# Patient Record
Sex: Female | Born: 1955
Health system: Southern US, Community
[De-identification: ages and names within clinical notes are randomized; demographics above are authoritative.]

## PROBLEM LIST (undated history)

## (undated) DIAGNOSIS — O24419 Gestational diabetes mellitus in pregnancy, unspecified control: Secondary | ICD-10-CM

## (undated) DIAGNOSIS — E559 Vitamin D deficiency, unspecified: Secondary | ICD-10-CM

## (undated) DIAGNOSIS — B20 Human immunodeficiency virus [HIV] disease: Secondary | ICD-10-CM

## (undated) DIAGNOSIS — M6530 Trigger finger, unspecified finger: Secondary | ICD-10-CM

## (undated) DIAGNOSIS — G510 Bell's palsy: Secondary | ICD-10-CM

## (undated) DIAGNOSIS — C801 Malignant (primary) neoplasm, unspecified: Secondary | ICD-10-CM

## (undated) DIAGNOSIS — Z87442 Personal history of urinary calculi: Secondary | ICD-10-CM

## (undated) DIAGNOSIS — S72009A Fracture of unspecified part of neck of unspecified femur, initial encounter for closed fracture: Secondary | ICD-10-CM

## (undated) DIAGNOSIS — N950 Postmenopausal bleeding: Secondary | ICD-10-CM

## (undated) DIAGNOSIS — Z801 Family history of malignant neoplasm of trachea, bronchus and lung: Secondary | ICD-10-CM

## (undated) DIAGNOSIS — L639 Alopecia areata, unspecified: Secondary | ICD-10-CM

## (undated) DIAGNOSIS — N183 Chronic kidney disease, stage 3 (moderate): Secondary | ICD-10-CM

## (undated) DIAGNOSIS — Z807 Family history of other malignant neoplasms of lymphoid, hematopoietic and related tissues: Secondary | ICD-10-CM

## (undated) DIAGNOSIS — B350 Tinea barbae and tinea capitis: Secondary | ICD-10-CM

## (undated) DIAGNOSIS — M1993 Secondary osteoarthritis, unspecified site: Secondary | ICD-10-CM

## (undated) DIAGNOSIS — E890 Postprocedural hypothyroidism: Secondary | ICD-10-CM

## (undated) DIAGNOSIS — R7301 Impaired fasting glucose: Secondary | ICD-10-CM

## (undated) DIAGNOSIS — M1712 Unilateral primary osteoarthritis, left knee: Secondary | ICD-10-CM

## (undated) DIAGNOSIS — I739 Peripheral vascular disease, unspecified: Secondary | ICD-10-CM

## (undated) DIAGNOSIS — E782 Mixed hyperlipidemia: Secondary | ICD-10-CM

## (undated) DIAGNOSIS — H04129 Dry eye syndrome of unspecified lacrimal gland: Secondary | ICD-10-CM

## (undated) DIAGNOSIS — I10 Essential (primary) hypertension: Secondary | ICD-10-CM

## (undated) DIAGNOSIS — Z9221 Personal history of antineoplastic chemotherapy: Secondary | ICD-10-CM

## (undated) HISTORY — DX: Trigger finger, unspecified finger: M65.30

## (undated) HISTORY — DX: Impaired fasting glucose: R73.01

## (undated) HISTORY — DX: Family history of malignant neoplasm of trachea, bronchus and lung: Z80.1

## (undated) HISTORY — PX: MASTECTOMY: SHX3

## (undated) HISTORY — DX: Essential (primary) hypertension: I10

## (undated) HISTORY — PX: COLONOSCOPY WITH ESOPHAGOGASTRODUODENOSCOPY (EGD): SHX5779

## (undated) HISTORY — DX: Peripheral vascular disease, unspecified: I73.9

## (undated) HISTORY — DX: Mixed hyperlipidemia: E78.2

## (undated) HISTORY — DX: Alopecia areata, unspecified: L63.9

## (undated) HISTORY — DX: Unilateral primary osteoarthritis, left knee: M17.12

## (undated) HISTORY — PX: TUBAL LIGATION: SHX77

## (undated) HISTORY — DX: Bell's palsy: G51.0

## (undated) HISTORY — DX: Chronic kidney disease, stage 3 (moderate): N18.3

## (undated) HISTORY — DX: Postprocedural hypothyroidism: E89.0

## (undated) HISTORY — DX: Fracture of unspecified part of neck of unspecified femur, initial encounter for closed fracture: S72.009A

## (undated) HISTORY — DX: Postmenopausal bleeding: N95.0

## (undated) HISTORY — PX: COLONOSCOPY: SHX174

## (undated) HISTORY — DX: Family history of other malignant neoplasms of lymphoid, hematopoietic and related tissues: Z80.7

## (undated) HISTORY — PX: OTHER SURGICAL HISTORY: SHX169

## (undated) HISTORY — DX: Tinea barbae and tinea capitis: B35.0

## (undated) HISTORY — DX: Secondary osteoarthritis, unspecified site: M19.93

## (undated) HISTORY — DX: Vitamin D deficiency, unspecified: E55.9

## (undated) HISTORY — DX: Dry eye syndrome of unspecified lacrimal gland: H04.129

## (undated) HISTORY — DX: Human immunodeficiency virus (HIV) disease: B20

## (undated) HISTORY — PX: BREAST LUMPECTOMY: SHX2

---
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
## 1992-05-25 ENCOUNTER — Encounter (INDEPENDENT_AMBULATORY_CARE_PROVIDER_SITE_OTHER): Payer: Self-pay | Admitting: *Deleted

## 1997-11-08 ENCOUNTER — Encounter: Admission: RE | Admit: 1997-11-08 | Discharge: 1997-11-08 | Payer: Self-pay | Admitting: Infectious Diseases

## 1997-12-06 ENCOUNTER — Encounter: Admission: RE | Admit: 1997-12-06 | Discharge: 1997-12-06 | Payer: Self-pay | Admitting: Infectious Diseases

## 1998-02-07 ENCOUNTER — Encounter: Admission: RE | Admit: 1998-02-07 | Discharge: 1998-02-07 | Payer: Self-pay | Admitting: Infectious Diseases

## 1998-03-07 ENCOUNTER — Encounter: Admission: RE | Admit: 1998-03-07 | Discharge: 1998-03-07 | Payer: Self-pay | Admitting: Internal Medicine

## 1998-03-29 ENCOUNTER — Encounter: Payer: Self-pay | Admitting: Internal Medicine

## 1998-03-29 ENCOUNTER — Ambulatory Visit (HOSPITAL_COMMUNITY): Admission: RE | Admit: 1998-03-29 | Discharge: 1998-03-29 | Payer: Self-pay | Admitting: Internal Medicine

## 1998-04-27 ENCOUNTER — Encounter: Admission: RE | Admit: 1998-04-27 | Discharge: 1998-04-27 | Payer: Self-pay | Admitting: Infectious Diseases

## 1998-04-27 ENCOUNTER — Ambulatory Visit (HOSPITAL_COMMUNITY): Admission: RE | Admit: 1998-04-27 | Discharge: 1998-04-27 | Payer: Self-pay | Admitting: Infectious Diseases

## 1998-06-02 ENCOUNTER — Encounter: Admission: RE | Admit: 1998-06-02 | Discharge: 1998-06-02 | Payer: Self-pay

## 1998-06-06 ENCOUNTER — Encounter: Admission: RE | Admit: 1998-06-06 | Discharge: 1998-06-06 | Payer: Self-pay | Admitting: Infectious Diseases

## 1998-07-14 ENCOUNTER — Encounter: Admission: RE | Admit: 1998-07-14 | Discharge: 1998-07-14 | Payer: Self-pay | Admitting: Infectious Diseases

## 1998-07-27 ENCOUNTER — Encounter: Admission: RE | Admit: 1998-07-27 | Discharge: 1998-07-27 | Payer: Self-pay | Admitting: Infectious Diseases

## 1998-09-14 ENCOUNTER — Ambulatory Visit (HOSPITAL_COMMUNITY): Admission: RE | Admit: 1998-09-14 | Discharge: 1998-09-14 | Payer: Self-pay | Admitting: Infectious Diseases

## 1998-09-28 ENCOUNTER — Encounter: Admission: RE | Admit: 1998-09-28 | Discharge: 1998-09-28 | Payer: Self-pay | Admitting: Infectious Diseases

## 1998-10-19 ENCOUNTER — Ambulatory Visit (HOSPITAL_COMMUNITY): Admission: RE | Admit: 1998-10-19 | Discharge: 1998-10-19 | Payer: Self-pay | Admitting: Infectious Diseases

## 1998-10-19 ENCOUNTER — Encounter: Payer: Self-pay | Admitting: Infectious Diseases

## 1998-11-09 ENCOUNTER — Ambulatory Visit (HOSPITAL_COMMUNITY): Admission: RE | Admit: 1998-11-09 | Discharge: 1998-11-09 | Payer: Self-pay | Admitting: Infectious Diseases

## 1998-11-23 ENCOUNTER — Encounter: Admission: RE | Admit: 1998-11-23 | Discharge: 1998-11-23 | Payer: Self-pay | Admitting: Infectious Diseases

## 1999-02-08 ENCOUNTER — Ambulatory Visit (HOSPITAL_COMMUNITY): Admission: RE | Admit: 1999-02-08 | Discharge: 1999-02-08 | Payer: Self-pay | Admitting: Infectious Diseases

## 1999-02-08 ENCOUNTER — Encounter: Admission: RE | Admit: 1999-02-08 | Discharge: 1999-02-08 | Payer: Self-pay | Admitting: Infectious Diseases

## 1999-03-08 ENCOUNTER — Encounter: Admission: RE | Admit: 1999-03-08 | Discharge: 1999-03-08 | Payer: Self-pay | Admitting: Infectious Diseases

## 1999-03-29 ENCOUNTER — Encounter: Admission: RE | Admit: 1999-03-29 | Discharge: 1999-03-29 | Payer: Self-pay | Admitting: Infectious Diseases

## 1999-05-31 ENCOUNTER — Encounter: Admission: RE | Admit: 1999-05-31 | Discharge: 1999-05-31 | Payer: Self-pay | Admitting: Internal Medicine

## 1999-05-31 ENCOUNTER — Ambulatory Visit (HOSPITAL_COMMUNITY): Admission: RE | Admit: 1999-05-31 | Discharge: 1999-05-31 | Payer: Self-pay | Admitting: Infectious Diseases

## 1999-06-12 ENCOUNTER — Encounter: Admission: RE | Admit: 1999-06-12 | Discharge: 1999-06-12 | Payer: Self-pay | Admitting: Infectious Diseases

## 1999-08-07 ENCOUNTER — Encounter: Admission: RE | Admit: 1999-08-07 | Discharge: 1999-08-07 | Payer: Self-pay | Admitting: Infectious Diseases

## 1999-11-01 ENCOUNTER — Encounter: Admission: RE | Admit: 1999-11-01 | Discharge: 1999-11-01 | Payer: Self-pay | Admitting: Infectious Diseases

## 1999-11-01 ENCOUNTER — Ambulatory Visit (HOSPITAL_COMMUNITY): Admission: RE | Admit: 1999-11-01 | Discharge: 1999-11-01 | Payer: Self-pay | Admitting: Infectious Diseases

## 1999-11-22 ENCOUNTER — Encounter: Admission: RE | Admit: 1999-11-22 | Discharge: 1999-11-22 | Payer: Self-pay | Admitting: Infectious Diseases

## 2000-01-23 ENCOUNTER — Emergency Department (HOSPITAL_COMMUNITY): Admission: EM | Admit: 2000-01-23 | Discharge: 2000-01-23 | Payer: Self-pay | Admitting: Emergency Medicine

## 2000-01-23 ENCOUNTER — Encounter: Payer: Self-pay | Admitting: Emergency Medicine

## 2000-02-19 ENCOUNTER — Ambulatory Visit (HOSPITAL_COMMUNITY): Admission: RE | Admit: 2000-02-19 | Discharge: 2000-02-19 | Payer: Self-pay | Admitting: Infectious Diseases

## 2000-02-19 ENCOUNTER — Encounter: Admission: RE | Admit: 2000-02-19 | Discharge: 2000-02-19 | Payer: Self-pay | Admitting: Infectious Diseases

## 2000-04-08 ENCOUNTER — Encounter: Payer: Self-pay | Admitting: Emergency Medicine

## 2000-04-08 ENCOUNTER — Emergency Department (HOSPITAL_COMMUNITY): Admission: EM | Admit: 2000-04-08 | Discharge: 2000-04-08 | Payer: Self-pay | Admitting: Emergency Medicine

## 2000-04-15 ENCOUNTER — Encounter: Admission: RE | Admit: 2000-04-15 | Discharge: 2000-04-15 | Payer: Self-pay | Admitting: Infectious Diseases

## 2000-06-25 HISTORY — PX: KNEE ARTHROSCOPY: SUR90

## 2000-07-08 ENCOUNTER — Ambulatory Visit (HOSPITAL_BASED_OUTPATIENT_CLINIC_OR_DEPARTMENT_OTHER): Admission: RE | Admit: 2000-07-08 | Discharge: 2000-07-08 | Payer: Self-pay | Admitting: Orthopedic Surgery

## 2000-07-23 ENCOUNTER — Ambulatory Visit (HOSPITAL_COMMUNITY): Admission: RE | Admit: 2000-07-23 | Discharge: 2000-07-23 | Payer: Self-pay | Admitting: Infectious Diseases

## 2000-07-23 ENCOUNTER — Encounter: Admission: RE | Admit: 2000-07-23 | Discharge: 2000-07-23 | Payer: Self-pay | Admitting: Infectious Diseases

## 2000-08-05 ENCOUNTER — Encounter: Admission: RE | Admit: 2000-08-05 | Discharge: 2000-08-05 | Payer: Self-pay | Admitting: Infectious Diseases

## 2000-10-22 ENCOUNTER — Ambulatory Visit (HOSPITAL_COMMUNITY): Admission: RE | Admit: 2000-10-22 | Discharge: 2000-10-22 | Payer: Self-pay | Admitting: Infectious Diseases

## 2000-10-22 ENCOUNTER — Encounter: Admission: RE | Admit: 2000-10-22 | Discharge: 2000-10-22 | Payer: Self-pay | Admitting: Infectious Diseases

## 2000-11-11 ENCOUNTER — Encounter: Admission: RE | Admit: 2000-11-11 | Discharge: 2000-11-11 | Payer: Self-pay | Admitting: Infectious Diseases

## 2001-03-04 ENCOUNTER — Encounter: Admission: RE | Admit: 2001-03-04 | Discharge: 2001-03-04 | Payer: Self-pay | Admitting: Infectious Diseases

## 2001-03-04 ENCOUNTER — Ambulatory Visit (HOSPITAL_COMMUNITY): Admission: RE | Admit: 2001-03-04 | Discharge: 2001-03-04 | Payer: Self-pay | Admitting: Infectious Diseases

## 2001-03-17 ENCOUNTER — Encounter: Admission: RE | Admit: 2001-03-17 | Discharge: 2001-03-17 | Payer: Self-pay | Admitting: Infectious Diseases

## 2001-09-08 ENCOUNTER — Encounter: Admission: RE | Admit: 2001-09-08 | Discharge: 2001-09-08 | Payer: Self-pay | Admitting: Internal Medicine

## 2001-09-08 ENCOUNTER — Ambulatory Visit (HOSPITAL_COMMUNITY): Admission: RE | Admit: 2001-09-08 | Discharge: 2001-09-08 | Payer: Self-pay | Admitting: Internal Medicine

## 2001-10-06 ENCOUNTER — Encounter: Admission: RE | Admit: 2001-10-06 | Discharge: 2001-10-06 | Payer: Self-pay | Admitting: Infectious Diseases

## 2001-10-16 ENCOUNTER — Encounter: Admission: RE | Admit: 2001-10-16 | Discharge: 2001-10-16 | Payer: Self-pay | Admitting: Obstetrics and Gynecology

## 2001-10-21 ENCOUNTER — Encounter: Admission: RE | Admit: 2001-10-21 | Discharge: 2001-10-21 | Payer: Self-pay | Admitting: Obstetrics and Gynecology

## 2001-10-21 ENCOUNTER — Encounter: Payer: Self-pay | Admitting: Obstetrics and Gynecology

## 2001-11-17 ENCOUNTER — Encounter: Admission: RE | Admit: 2001-11-17 | Discharge: 2001-11-17 | Payer: Self-pay | Admitting: Infectious Diseases

## 2002-02-23 ENCOUNTER — Encounter: Payer: Self-pay | Admitting: Infectious Disease

## 2002-03-02 ENCOUNTER — Encounter: Admission: RE | Admit: 2002-03-02 | Discharge: 2002-03-02 | Payer: Self-pay | Admitting: Infectious Diseases

## 2002-03-02 ENCOUNTER — Encounter: Payer: Self-pay | Admitting: Infectious Diseases

## 2002-03-02 ENCOUNTER — Ambulatory Visit (HOSPITAL_COMMUNITY): Admission: RE | Admit: 2002-03-02 | Discharge: 2002-03-02 | Payer: Self-pay | Admitting: Infectious Diseases

## 2002-04-13 ENCOUNTER — Encounter: Admission: RE | Admit: 2002-04-13 | Discharge: 2002-04-13 | Payer: Self-pay | Admitting: Infectious Diseases

## 2002-06-01 ENCOUNTER — Encounter: Admission: RE | Admit: 2002-06-01 | Discharge: 2002-06-01 | Payer: Self-pay | Admitting: Infectious Diseases

## 2002-06-01 ENCOUNTER — Ambulatory Visit (HOSPITAL_COMMUNITY): Admission: RE | Admit: 2002-06-01 | Discharge: 2002-06-01 | Payer: Self-pay | Admitting: Infectious Diseases

## 2002-08-03 ENCOUNTER — Encounter: Admission: RE | Admit: 2002-08-03 | Discharge: 2002-08-03 | Payer: Self-pay | Admitting: Infectious Diseases

## 2002-09-07 ENCOUNTER — Encounter: Admission: RE | Admit: 2002-09-07 | Discharge: 2002-09-07 | Payer: Self-pay | Admitting: Infectious Diseases

## 2002-09-11 ENCOUNTER — Encounter: Admission: RE | Admit: 2002-09-11 | Discharge: 2002-09-11 | Payer: Self-pay | Admitting: Infectious Diseases

## 2002-11-26 ENCOUNTER — Encounter: Admission: RE | Admit: 2002-11-26 | Discharge: 2002-11-26 | Payer: Self-pay | Admitting: Infectious Diseases

## 2002-11-26 ENCOUNTER — Encounter (INDEPENDENT_AMBULATORY_CARE_PROVIDER_SITE_OTHER): Payer: Self-pay | Admitting: Infectious Diseases

## 2002-12-23 ENCOUNTER — Encounter: Admission: RE | Admit: 2002-12-23 | Discharge: 2002-12-23 | Payer: Self-pay | Admitting: Infectious Diseases

## 2003-03-04 ENCOUNTER — Encounter: Admission: RE | Admit: 2003-03-04 | Discharge: 2003-03-04 | Payer: Self-pay | Admitting: Obstetrics and Gynecology

## 2003-03-18 ENCOUNTER — Encounter: Admission: RE | Admit: 2003-03-18 | Discharge: 2003-03-18 | Payer: Self-pay | Admitting: Obstetrics and Gynecology

## 2003-03-23 ENCOUNTER — Ambulatory Visit (HOSPITAL_COMMUNITY): Admission: RE | Admit: 2003-03-23 | Discharge: 2003-03-23 | Payer: Self-pay | Admitting: Infectious Diseases

## 2003-03-23 ENCOUNTER — Encounter: Admission: RE | Admit: 2003-03-23 | Discharge: 2003-03-23 | Payer: Self-pay | Admitting: Infectious Diseases

## 2003-03-23 ENCOUNTER — Encounter (INDEPENDENT_AMBULATORY_CARE_PROVIDER_SITE_OTHER): Payer: Self-pay | Admitting: Infectious Diseases

## 2003-03-26 ENCOUNTER — Ambulatory Visit (HOSPITAL_COMMUNITY): Admission: RE | Admit: 2003-03-26 | Discharge: 2003-03-26 | Payer: Self-pay | Admitting: Obstetrics and Gynecology

## 2003-04-12 ENCOUNTER — Encounter: Admission: RE | Admit: 2003-04-12 | Discharge: 2003-04-12 | Payer: Self-pay | Admitting: Infectious Diseases

## 2003-06-26 DIAGNOSIS — C801 Malignant (primary) neoplasm, unspecified: Secondary | ICD-10-CM

## 2003-06-26 HISTORY — DX: Malignant (primary) neoplasm, unspecified: C80.1

## 2003-06-26 HISTORY — PX: LYMPH NODE DISSECTION: SHX5087

## 2003-09-30 ENCOUNTER — Ambulatory Visit (HOSPITAL_COMMUNITY): Admission: RE | Admit: 2003-09-30 | Discharge: 2003-09-30 | Payer: Self-pay | Admitting: Infectious Diseases

## 2003-09-30 ENCOUNTER — Encounter: Admission: RE | Admit: 2003-09-30 | Discharge: 2003-09-30 | Payer: Self-pay | Admitting: Infectious Diseases

## 2003-10-13 ENCOUNTER — Encounter: Admission: RE | Admit: 2003-10-13 | Discharge: 2003-10-13 | Payer: Self-pay | Admitting: Infectious Diseases

## 2004-02-21 ENCOUNTER — Encounter: Admission: RE | Admit: 2004-02-21 | Discharge: 2004-02-21 | Payer: Self-pay | Admitting: Infectious Diseases

## 2004-02-21 ENCOUNTER — Ambulatory Visit (HOSPITAL_COMMUNITY): Admission: RE | Admit: 2004-02-21 | Discharge: 2004-02-21 | Payer: Self-pay | Admitting: Infectious Diseases

## 2004-03-23 ENCOUNTER — Encounter (INDEPENDENT_AMBULATORY_CARE_PROVIDER_SITE_OTHER): Payer: Self-pay | Admitting: *Deleted

## 2004-03-23 ENCOUNTER — Encounter (INDEPENDENT_AMBULATORY_CARE_PROVIDER_SITE_OTHER): Payer: Self-pay | Admitting: Radiology

## 2004-03-23 ENCOUNTER — Encounter: Admission: RE | Admit: 2004-03-23 | Discharge: 2004-03-23 | Payer: Self-pay | Admitting: Family Medicine

## 2004-03-27 ENCOUNTER — Ambulatory Visit: Payer: Self-pay | Admitting: Infectious Diseases

## 2004-03-29 ENCOUNTER — Encounter (HOSPITAL_COMMUNITY): Admission: RE | Admit: 2004-03-29 | Discharge: 2004-04-07 | Payer: Self-pay | Admitting: Family Medicine

## 2004-04-12 ENCOUNTER — Encounter: Admission: RE | Admit: 2004-04-12 | Discharge: 2004-04-12 | Payer: Self-pay | Admitting: General Surgery

## 2004-04-14 ENCOUNTER — Ambulatory Visit (HOSPITAL_BASED_OUTPATIENT_CLINIC_OR_DEPARTMENT_OTHER): Admission: RE | Admit: 2004-04-14 | Discharge: 2004-04-14 | Payer: Self-pay | Admitting: General Surgery

## 2004-04-14 ENCOUNTER — Encounter (INDEPENDENT_AMBULATORY_CARE_PROVIDER_SITE_OTHER): Payer: Self-pay | Admitting: General Surgery

## 2004-04-14 ENCOUNTER — Encounter (INDEPENDENT_AMBULATORY_CARE_PROVIDER_SITE_OTHER): Payer: Self-pay | Admitting: Specialist

## 2004-04-14 ENCOUNTER — Ambulatory Visit (HOSPITAL_COMMUNITY): Admission: RE | Admit: 2004-04-14 | Discharge: 2004-04-14 | Payer: Self-pay | Admitting: General Surgery

## 2004-04-26 ENCOUNTER — Ambulatory Visit: Payer: Self-pay | Admitting: Oncology

## 2004-05-08 ENCOUNTER — Ambulatory Visit (HOSPITAL_BASED_OUTPATIENT_CLINIC_OR_DEPARTMENT_OTHER): Admission: RE | Admit: 2004-05-08 | Discharge: 2004-05-08 | Payer: Self-pay | Admitting: General Surgery

## 2004-05-23 ENCOUNTER — Encounter (HOSPITAL_COMMUNITY): Admission: RE | Admit: 2004-05-23 | Discharge: 2004-06-24 | Payer: Self-pay | Admitting: Oncology

## 2004-06-23 ENCOUNTER — Ambulatory Visit: Payer: Self-pay | Admitting: Oncology

## 2004-06-25 HISTORY — PX: PORT-A-CATH REMOVAL: SHX5289

## 2004-06-25 HISTORY — PX: HYSTEROSCOPY: SHX211

## 2004-07-17 ENCOUNTER — Ambulatory Visit: Payer: Self-pay | Admitting: Infectious Diseases

## 2004-07-17 ENCOUNTER — Ambulatory Visit (HOSPITAL_COMMUNITY): Admission: RE | Admit: 2004-07-17 | Discharge: 2004-07-17 | Payer: Self-pay | Admitting: Infectious Diseases

## 2004-07-31 ENCOUNTER — Ambulatory Visit: Payer: Self-pay | Admitting: Infectious Diseases

## 2004-08-09 ENCOUNTER — Ambulatory Visit: Payer: Self-pay | Admitting: Oncology

## 2004-09-25 ENCOUNTER — Ambulatory Visit: Payer: Self-pay | Admitting: Oncology

## 2004-10-20 ENCOUNTER — Ambulatory Visit: Admission: RE | Admit: 2004-10-20 | Discharge: 2005-01-16 | Payer: Self-pay | Admitting: Radiation Oncology

## 2004-10-23 ENCOUNTER — Other Ambulatory Visit: Admission: RE | Admit: 2004-10-23 | Discharge: 2004-10-23 | Payer: Self-pay | Admitting: Oncology

## 2004-10-25 ENCOUNTER — Encounter: Admission: RE | Admit: 2004-10-25 | Discharge: 2004-10-25 | Payer: Self-pay | Admitting: Radiation Oncology

## 2004-11-09 ENCOUNTER — Other Ambulatory Visit: Admission: RE | Admit: 2004-11-09 | Discharge: 2004-11-09 | Payer: Self-pay | Admitting: Oncology

## 2004-11-28 ENCOUNTER — Ambulatory Visit (HOSPITAL_COMMUNITY): Admission: RE | Admit: 2004-11-28 | Discharge: 2004-11-28 | Payer: Self-pay | Admitting: Infectious Diseases

## 2004-11-28 ENCOUNTER — Ambulatory Visit: Payer: Self-pay | Admitting: Infectious Diseases

## 2004-12-15 ENCOUNTER — Ambulatory Visit: Payer: Self-pay | Admitting: Oncology

## 2005-01-12 ENCOUNTER — Ambulatory Visit (HOSPITAL_BASED_OUTPATIENT_CLINIC_OR_DEPARTMENT_OTHER): Admission: RE | Admit: 2005-01-12 | Discharge: 2005-01-12 | Payer: Self-pay | Admitting: General Surgery

## 2005-03-12 ENCOUNTER — Ambulatory Visit: Payer: Self-pay | Admitting: Infectious Diseases

## 2005-03-26 ENCOUNTER — Encounter: Admission: RE | Admit: 2005-03-26 | Discharge: 2005-03-26 | Payer: Self-pay | Admitting: General Surgery

## 2005-04-06 ENCOUNTER — Ambulatory Visit: Payer: Self-pay | Admitting: Oncology

## 2005-04-10 ENCOUNTER — Ambulatory Visit (HOSPITAL_COMMUNITY): Admission: RE | Admit: 2005-04-10 | Discharge: 2005-04-10 | Payer: Self-pay | Admitting: Oncology

## 2005-05-10 ENCOUNTER — Ambulatory Visit: Payer: Self-pay | Admitting: Obstetrics and Gynecology

## 2005-05-11 ENCOUNTER — Ambulatory Visit: Payer: Self-pay | Admitting: Infectious Diseases

## 2005-05-11 ENCOUNTER — Ambulatory Visit (HOSPITAL_COMMUNITY): Admission: RE | Admit: 2005-05-11 | Discharge: 2005-05-11 | Payer: Self-pay | Admitting: Infectious Diseases

## 2005-06-11 ENCOUNTER — Ambulatory Visit: Payer: Self-pay | Admitting: Infectious Diseases

## 2005-09-17 ENCOUNTER — Encounter: Admission: RE | Admit: 2005-09-17 | Discharge: 2005-09-17 | Payer: Self-pay | Admitting: Infectious Diseases

## 2005-09-17 ENCOUNTER — Encounter (INDEPENDENT_AMBULATORY_CARE_PROVIDER_SITE_OTHER): Payer: Self-pay | Admitting: *Deleted

## 2005-09-17 ENCOUNTER — Ambulatory Visit: Payer: Self-pay | Admitting: Infectious Diseases

## 2005-09-17 LAB — CONVERTED CEMR LAB: HIV 1 RNA Quant: 49 copies/mL

## 2005-10-01 ENCOUNTER — Ambulatory Visit: Payer: Self-pay | Admitting: Infectious Diseases

## 2005-10-05 ENCOUNTER — Ambulatory Visit: Payer: Self-pay | Admitting: Oncology

## 2005-10-08 LAB — CBC WITH DIFFERENTIAL/PLATELET
BASO%: 0.6 % (ref 0.0–2.0)
Basophils Absolute: 0 10*3/uL (ref 0.0–0.1)
EOS%: 4.3 % (ref 0.0–7.0)
HCT: 39.7 % (ref 34.8–46.6)
HGB: 13.7 g/dL (ref 11.6–15.9)
LYMPH%: 35.9 % (ref 14.0–48.0)
MCH: 33.7 pg (ref 26.0–34.0)
MCHC: 34.4 g/dL (ref 32.0–36.0)
MCV: 97.8 fL (ref 81.0–101.0)
NEUT%: 47.6 % (ref 39.6–76.8)
Platelets: 196 10*3/uL (ref 145–400)

## 2005-10-08 LAB — COMPREHENSIVE METABOLIC PANEL
BUN: 19 mg/dL (ref 6–23)
CO2: 29 mEq/L (ref 19–32)
Calcium: 9.9 mg/dL (ref 8.4–10.5)
Chloride: 106 mEq/L (ref 96–112)
Creatinine, Ser: 0.9 mg/dL (ref 0.4–1.2)
Glucose, Bld: 94 mg/dL (ref 70–99)
Total Bilirubin: 0.6 mg/dL (ref 0.3–1.2)

## 2005-12-04 ENCOUNTER — Emergency Department (HOSPITAL_COMMUNITY): Admission: EM | Admit: 2005-12-04 | Discharge: 2005-12-04 | Payer: Self-pay | Admitting: Family Medicine

## 2006-02-04 ENCOUNTER — Emergency Department (HOSPITAL_COMMUNITY): Admission: EM | Admit: 2006-02-04 | Discharge: 2006-02-04 | Payer: Self-pay | Admitting: Emergency Medicine

## 2006-02-11 ENCOUNTER — Encounter (INDEPENDENT_AMBULATORY_CARE_PROVIDER_SITE_OTHER): Payer: Self-pay | Admitting: *Deleted

## 2006-02-11 ENCOUNTER — Encounter: Admission: RE | Admit: 2006-02-11 | Discharge: 2006-02-11 | Payer: Self-pay | Admitting: Infectious Diseases

## 2006-02-11 ENCOUNTER — Ambulatory Visit: Payer: Self-pay | Admitting: Infectious Diseases

## 2006-02-11 LAB — CONVERTED CEMR LAB
CD4 Count: 490 microliters
HIV 1 RNA Quant: 75 copies/mL

## 2006-03-11 ENCOUNTER — Ambulatory Visit: Payer: Self-pay | Admitting: Infectious Diseases

## 2006-03-27 ENCOUNTER — Ambulatory Visit: Payer: Self-pay | Admitting: Infectious Diseases

## 2006-03-27 DIAGNOSIS — B20 Human immunodeficiency virus [HIV] disease: Secondary | ICD-10-CM

## 2006-03-27 DIAGNOSIS — Z853 Personal history of malignant neoplasm of breast: Secondary | ICD-10-CM

## 2006-03-27 DIAGNOSIS — I10 Essential (primary) hypertension: Secondary | ICD-10-CM

## 2006-03-27 HISTORY — DX: Essential (primary) hypertension: I10

## 2006-03-27 HISTORY — DX: Human immunodeficiency virus (HIV) disease: B20

## 2006-03-28 ENCOUNTER — Encounter: Admission: RE | Admit: 2006-03-28 | Discharge: 2006-03-28 | Payer: Self-pay | Admitting: Oncology

## 2006-04-11 ENCOUNTER — Emergency Department (HOSPITAL_COMMUNITY): Admission: EM | Admit: 2006-04-11 | Discharge: 2006-04-12 | Payer: Self-pay | Admitting: Emergency Medicine

## 2006-04-16 ENCOUNTER — Ambulatory Visit (HOSPITAL_COMMUNITY): Admission: RE | Admit: 2006-04-16 | Discharge: 2006-04-16 | Payer: Self-pay | Admitting: Oncology

## 2006-04-18 ENCOUNTER — Ambulatory Visit: Payer: Self-pay | Admitting: Oncology

## 2006-04-22 ENCOUNTER — Ambulatory Visit (HOSPITAL_COMMUNITY): Admission: RE | Admit: 2006-04-22 | Discharge: 2006-04-22 | Payer: Self-pay | Admitting: Oncology

## 2006-05-02 ENCOUNTER — Ambulatory Visit (HOSPITAL_COMMUNITY): Admission: RE | Admit: 2006-05-02 | Discharge: 2006-05-02 | Payer: Self-pay | Admitting: Oncology

## 2006-05-06 ENCOUNTER — Encounter: Admission: RE | Admit: 2006-05-06 | Discharge: 2006-05-06 | Payer: Self-pay | Admitting: Orthopedic Surgery

## 2006-05-23 ENCOUNTER — Encounter: Payer: Self-pay | Admitting: Family Medicine

## 2006-05-23 ENCOUNTER — Ambulatory Visit: Payer: Self-pay | Admitting: Gynecology

## 2006-05-23 ENCOUNTER — Encounter: Payer: Self-pay | Admitting: Infectious Disease

## 2006-07-23 ENCOUNTER — Ambulatory Visit: Payer: Self-pay | Admitting: Infectious Diseases

## 2006-07-23 ENCOUNTER — Encounter (INDEPENDENT_AMBULATORY_CARE_PROVIDER_SITE_OTHER): Payer: Self-pay | Admitting: *Deleted

## 2006-07-23 ENCOUNTER — Encounter: Admission: RE | Admit: 2006-07-23 | Discharge: 2006-07-23 | Payer: Self-pay | Admitting: Infectious Diseases

## 2006-07-23 LAB — CONVERTED CEMR LAB
AST: 21 units/L (ref 0–37)
Albumin: 4.1 g/dL (ref 3.5–5.2)
BUN: 13 mg/dL (ref 6–23)
Basophils Relative: 1 % (ref 0–1)
CD4 Count: 620 microliters
CO2: 29 meq/L (ref 19–32)
Calcium: 10.1 mg/dL (ref 8.4–10.5)
Chloride: 101 meq/L (ref 96–112)
Cholesterol: 218 mg/dL — ABNORMAL HIGH (ref 0–200)
HDL: 57 mg/dL (ref >39)
HIV-1 RNA Quant, Log: 2.66 — ABNORMAL HIGH (ref <1.70)
Hemoglobin: 13.2 g/dL (ref 12.0–15.0)
Ketones, ur: NEGATIVE mg/dL
Leukocytes, UA: NEGATIVE
Lymphocytes Relative: 45 % (ref 12–46)
Lymphs Abs: 2.6 10*3/uL (ref 0.7–3.3)
Monocytes Absolute: 0.6 10*3/uL (ref 0.2–0.7)
Monocytes Relative: 10 % (ref 3–11)
Neutro Abs: 2.5 10*3/uL (ref 1.7–7.7)
Neutrophils Relative %: 42 % — ABNORMAL LOW (ref 43–77)
Nitrite: NEGATIVE
Potassium: 3.8 meq/L (ref 3.5–5.3)
RBC: 4.07 M/uL (ref 3.87–5.11)
Specific Gravity, Urine: 1.013 (ref 1.005–1.03)
Urine Glucose: NEGATIVE mg/dL
WBC: 5.9 10*3/uL (ref 4.0–10.5)
pH: 6.5 (ref 5.0–8.0)

## 2006-08-19 ENCOUNTER — Encounter (INDEPENDENT_AMBULATORY_CARE_PROVIDER_SITE_OTHER): Payer: Self-pay | Admitting: *Deleted

## 2006-08-19 LAB — CONVERTED CEMR LAB

## 2006-09-01 ENCOUNTER — Encounter (INDEPENDENT_AMBULATORY_CARE_PROVIDER_SITE_OTHER): Payer: Self-pay | Admitting: *Deleted

## 2006-09-16 ENCOUNTER — Encounter (INDEPENDENT_AMBULATORY_CARE_PROVIDER_SITE_OTHER): Payer: Self-pay | Admitting: Infectious Diseases

## 2006-09-23 ENCOUNTER — Ambulatory Visit: Payer: Self-pay | Admitting: Infectious Diseases

## 2006-10-16 ENCOUNTER — Ambulatory Visit: Payer: Self-pay | Admitting: Oncology

## 2006-10-21 LAB — CBC WITH DIFFERENTIAL/PLATELET
BASO%: 0.4 % (ref 0.0–2.0)
Basophils Absolute: 0 10*3/uL (ref 0.0–0.1)
Eosinophils Absolute: 0.1 10*3/uL (ref 0.0–0.5)
HCT: 38.1 % (ref 34.8–46.6)
HGB: 13.5 g/dL (ref 11.6–15.9)
MCHC: 35.5 g/dL (ref 32.0–36.0)
MONO#: 0.7 10*3/uL (ref 0.1–0.9)
NEUT#: 2.9 10*3/uL (ref 1.5–6.5)
NEUT%: 47.3 % (ref 39.6–76.8)
WBC: 6.2 10*3/uL (ref 3.9–10.0)
lymph#: 2.4 10*3/uL (ref 0.9–3.3)

## 2006-12-09 ENCOUNTER — Telehealth (INDEPENDENT_AMBULATORY_CARE_PROVIDER_SITE_OTHER): Payer: Self-pay | Admitting: *Deleted

## 2006-12-13 ENCOUNTER — Telehealth (INDEPENDENT_AMBULATORY_CARE_PROVIDER_SITE_OTHER): Payer: Self-pay | Admitting: Infectious Diseases

## 2006-12-31 ENCOUNTER — Telehealth: Payer: Self-pay | Admitting: Internal Medicine

## 2007-02-26 ENCOUNTER — Encounter: Admission: RE | Admit: 2007-02-26 | Discharge: 2007-02-26 | Payer: Self-pay | Admitting: Infectious Disease

## 2007-02-26 ENCOUNTER — Ambulatory Visit: Payer: Self-pay | Admitting: Infectious Disease

## 2007-02-26 LAB — CONVERTED CEMR LAB
AST: 25 units/L (ref 0–37)
Albumin: 4.3 g/dL (ref 3.5–5.2)
Alkaline Phosphatase: 113 units/L (ref 39–117)
BUN: 21 mg/dL (ref 6–23)
Basophils Relative: 0 % (ref 0–1)
Eosinophils Absolute: 0.2 10*3/uL (ref 0.0–0.7)
MCHC: 33.4 g/dL (ref 30.0–36.0)
MCV: 98.4 fL (ref 78.0–100.0)
Neutrophils Relative %: 41 % — ABNORMAL LOW (ref 43–77)
Platelets: 219 10*3/uL (ref 150–400)
Potassium: 4.5 meq/L (ref 3.5–5.3)
RDW: 15.5 % — ABNORMAL HIGH (ref 11.5–14.0)
Sodium: 139 meq/L (ref 135–145)
Total Protein: 7.6 g/dL (ref 6.0–8.3)

## 2007-03-03 ENCOUNTER — Ambulatory Visit: Payer: Self-pay | Admitting: Infectious Disease

## 2007-03-03 ENCOUNTER — Encounter (INDEPENDENT_AMBULATORY_CARE_PROVIDER_SITE_OTHER): Payer: Self-pay | Admitting: *Deleted

## 2007-03-05 ENCOUNTER — Telehealth: Payer: Self-pay | Admitting: Infectious Disease

## 2007-03-06 ENCOUNTER — Encounter (INDEPENDENT_AMBULATORY_CARE_PROVIDER_SITE_OTHER): Payer: Self-pay | Admitting: *Deleted

## 2007-03-17 ENCOUNTER — Ambulatory Visit: Payer: Self-pay | Admitting: Infectious Disease

## 2007-03-17 ENCOUNTER — Encounter: Admission: RE | Admit: 2007-03-17 | Discharge: 2007-03-17 | Payer: Self-pay | Admitting: Infectious Disease

## 2007-03-17 LAB — CONVERTED CEMR LAB
Basophils Absolute: 0 10*3/uL (ref 0.0–0.1)
CO2: 26 meq/L (ref 19–32)
Calcium: 10.4 mg/dL (ref 8.4–10.5)
Creatinine, Ser: 1.06 mg/dL (ref 0.40–1.20)
Eosinophils Relative: 3 % (ref 0–5)
Glucose, Bld: 80 mg/dL (ref 70–99)
HCT: 41.8 % (ref 36.0–46.0)
Hemoglobin: 14 g/dL (ref 12.0–15.0)
Lymphocytes Relative: 52 % — ABNORMAL HIGH (ref 12–46)
Lymphs Abs: 2.7 10*3/uL (ref 0.7–3.3)
Monocytes Absolute: 0.7 10*3/uL (ref 0.2–0.7)
RDW: 14.7 % — ABNORMAL HIGH (ref 11.5–14.0)

## 2007-03-31 ENCOUNTER — Encounter: Admission: RE | Admit: 2007-03-31 | Discharge: 2007-03-31 | Payer: Self-pay | Admitting: Oncology

## 2007-04-01 ENCOUNTER — Encounter: Admission: RE | Admit: 2007-04-01 | Discharge: 2007-04-01 | Payer: Self-pay | Admitting: Orthopedic Surgery

## 2007-04-02 ENCOUNTER — Telehealth: Payer: Self-pay | Admitting: Infectious Disease

## 2007-04-07 ENCOUNTER — Ambulatory Visit: Payer: Self-pay | Admitting: Infectious Disease

## 2007-04-07 DIAGNOSIS — M1993 Secondary osteoarthritis, unspecified site: Secondary | ICD-10-CM

## 2007-04-07 DIAGNOSIS — I739 Peripheral vascular disease, unspecified: Secondary | ICD-10-CM

## 2007-04-07 HISTORY — DX: Peripheral vascular disease, unspecified: I73.9

## 2007-04-07 HISTORY — DX: Secondary osteoarthritis, unspecified site: M19.93

## 2007-04-09 ENCOUNTER — Ambulatory Visit: Payer: Self-pay | Admitting: Vascular Surgery

## 2007-04-09 ENCOUNTER — Encounter: Admission: RE | Admit: 2007-04-09 | Discharge: 2007-04-09 | Payer: Self-pay | Admitting: Internal Medicine

## 2007-04-09 ENCOUNTER — Encounter (INDEPENDENT_AMBULATORY_CARE_PROVIDER_SITE_OTHER): Payer: Self-pay | Admitting: Orthopedic Surgery

## 2007-04-09 ENCOUNTER — Ambulatory Visit: Payer: Self-pay | Admitting: Oncology

## 2007-04-09 ENCOUNTER — Ambulatory Visit (HOSPITAL_COMMUNITY): Admission: RE | Admit: 2007-04-09 | Discharge: 2007-04-09 | Payer: Self-pay | Admitting: Orthopedic Surgery

## 2007-04-14 LAB — CBC WITH DIFFERENTIAL/PLATELET
Basophils Absolute: 0 10*3/uL (ref 0.0–0.1)
EOS%: 3.2 % (ref 0.0–7.0)
HCT: 40.1 % (ref 34.8–46.6)
HGB: 13.8 g/dL (ref 11.6–15.9)
MCH: 34.2 pg — ABNORMAL HIGH (ref 26.0–34.0)
MONO#: 0.6 10*3/uL (ref 0.1–0.9)
NEUT%: 37.5 % — ABNORMAL LOW (ref 39.6–76.8)
Platelets: 203 10*3/uL (ref 145–400)
lymph#: 2.3 10*3/uL (ref 0.9–3.3)

## 2007-04-14 LAB — COMPREHENSIVE METABOLIC PANEL
BUN: 15 mg/dL (ref 6–23)
CO2: 29 mEq/L (ref 19–32)
Calcium: 10.3 mg/dL (ref 8.4–10.5)
Chloride: 104 mEq/L (ref 96–112)
Creatinine, Ser: 0.94 mg/dL (ref 0.40–1.20)

## 2007-04-14 LAB — CANCER ANTIGEN 27.29: CA 27.29: 18 U/mL (ref 0–39)

## 2007-04-21 ENCOUNTER — Encounter: Payer: Self-pay | Admitting: Infectious Disease

## 2007-04-28 ENCOUNTER — Ambulatory Visit (HOSPITAL_COMMUNITY): Admission: RE | Admit: 2007-04-28 | Discharge: 2007-04-28 | Payer: Self-pay | Admitting: Oncology

## 2007-05-05 ENCOUNTER — Ambulatory Visit (HOSPITAL_COMMUNITY): Admission: RE | Admit: 2007-05-05 | Discharge: 2007-05-05 | Payer: Self-pay | Admitting: Orthopedic Surgery

## 2007-05-14 ENCOUNTER — Telehealth: Payer: Self-pay | Admitting: Infectious Disease

## 2007-06-02 ENCOUNTER — Telehealth: Payer: Self-pay | Admitting: Infectious Disease

## 2007-06-13 ENCOUNTER — Telehealth: Payer: Self-pay | Admitting: Infectious Disease

## 2007-06-24 ENCOUNTER — Encounter (INDEPENDENT_AMBULATORY_CARE_PROVIDER_SITE_OTHER): Payer: Self-pay | Admitting: *Deleted

## 2007-06-26 HISTORY — PX: THYROID SURGERY: SHX805

## 2007-07-30 ENCOUNTER — Encounter (INDEPENDENT_AMBULATORY_CARE_PROVIDER_SITE_OTHER): Payer: Self-pay | Admitting: *Deleted

## 2007-07-30 ENCOUNTER — Telehealth: Payer: Self-pay | Admitting: Infectious Disease

## 2007-08-04 ENCOUNTER — Encounter: Admission: RE | Admit: 2007-08-04 | Discharge: 2007-08-04 | Payer: Self-pay | Admitting: Infectious Disease

## 2007-08-04 ENCOUNTER — Ambulatory Visit: Payer: Self-pay | Admitting: Infectious Disease

## 2007-08-04 DIAGNOSIS — B350 Tinea barbae and tinea capitis: Secondary | ICD-10-CM

## 2007-08-04 DIAGNOSIS — L253 Unspecified contact dermatitis due to other chemical products: Secondary | ICD-10-CM | POA: Insufficient documentation

## 2007-08-04 LAB — CONVERTED CEMR LAB
ALT: 20 units/L (ref 0–35)
BUN: 16 mg/dL (ref 6–23)
Basophils Absolute: 0 10*3/uL (ref 0.0–0.1)
CO2: 27 meq/L (ref 19–32)
Calcium: 10.4 mg/dL (ref 8.4–10.5)
Chloride: 106 meq/L (ref 96–112)
Creatinine, Ser: 0.78 mg/dL (ref 0.40–1.20)
Glucose, Bld: 120 mg/dL — ABNORMAL HIGH (ref 70–99)
HIV 1 RNA Quant: 190 copies/mL — ABNORMAL HIGH (ref <50)
HIV-1 RNA Quant, Log: 2.28 — ABNORMAL HIGH (ref <1.70)
MCHC: 32.7 g/dL (ref 30.0–36.0)
MCV: 95.9 fL (ref 78.0–100.0)
Neutrophils Relative %: 31 % — ABNORMAL LOW (ref 43–77)
Platelets: 198 10*3/uL (ref 150–400)
T4, Total: 11.4 ug/dL (ref 5.0–12.5)
TSH: 0.012 microintl units/mL — ABNORMAL LOW (ref 0.350–5.50)
Total Bilirubin: 0.6 mg/dL (ref 0.3–1.2)

## 2007-08-06 ENCOUNTER — Telehealth: Payer: Self-pay | Admitting: Infectious Disease

## 2007-08-06 DIAGNOSIS — E559 Vitamin D deficiency, unspecified: Secondary | ICD-10-CM | POA: Insufficient documentation

## 2007-08-06 HISTORY — DX: Vitamin D deficiency, unspecified: E55.9

## 2007-10-13 ENCOUNTER — Telehealth (INDEPENDENT_AMBULATORY_CARE_PROVIDER_SITE_OTHER): Payer: Self-pay | Admitting: *Deleted

## 2007-10-15 ENCOUNTER — Ambulatory Visit: Payer: Self-pay | Admitting: Oncology

## 2007-10-20 ENCOUNTER — Encounter: Payer: Self-pay | Admitting: Infectious Disease

## 2007-10-20 ENCOUNTER — Ambulatory Visit: Payer: Self-pay | Admitting: Infectious Disease

## 2007-10-20 LAB — CBC WITH DIFFERENTIAL/PLATELET
BASO%: 0 % (ref 0.0–2.0)
EOS%: 3.3 % (ref 0.0–7.0)
LYMPH%: 50.2 % — ABNORMAL HIGH (ref 14.0–48.0)
MCH: 31.6 pg (ref 26.0–34.0)
MCHC: 34.6 g/dL (ref 32.0–36.0)
MONO#: 0.7 10*3/uL (ref 0.1–0.9)
Platelets: 195 10*3/uL (ref 145–400)
RBC: 4.42 10*6/uL (ref 3.70–5.32)
WBC: 5.5 10*3/uL (ref 3.9–10.0)

## 2007-10-20 LAB — COMPREHENSIVE METABOLIC PANEL
ALT: 49 U/L — ABNORMAL HIGH (ref 0–35)
AST: 32 U/L (ref 0–37)
Alkaline Phosphatase: 158 U/L — ABNORMAL HIGH (ref 39–117)
CO2: 26 mEq/L (ref 19–32)
Creatinine, Ser: 0.88 mg/dL (ref 0.40–1.20)
Sodium: 142 mEq/L (ref 135–145)
Total Bilirubin: 0.4 mg/dL (ref 0.3–1.2)
Total Protein: 7.5 g/dL (ref 6.0–8.3)

## 2007-10-31 ENCOUNTER — Encounter: Payer: Self-pay | Admitting: Infectious Disease

## 2007-11-03 ENCOUNTER — Encounter: Admission: RE | Admit: 2007-11-03 | Discharge: 2007-11-03 | Payer: Self-pay | Admitting: Infectious Disease

## 2007-11-03 ENCOUNTER — Ambulatory Visit: Payer: Self-pay | Admitting: Infectious Disease

## 2007-11-03 LAB — CONVERTED CEMR LAB
ALT: 41 units/L — ABNORMAL HIGH (ref 0–35)
AST: 29 units/L (ref 0–37)
Alkaline Phosphatase: 156 units/L — ABNORMAL HIGH (ref 39–117)
BUN: 17 mg/dL (ref 6–23)
Basophils Absolute: 0 10*3/uL (ref 0.0–0.1)
Basophils Relative: 0 % (ref 0–1)
Creatinine, Ser: 0.86 mg/dL (ref 0.40–1.20)
Eosinophils Absolute: 0.2 10*3/uL (ref 0.0–0.7)
HDL: 43 mg/dL (ref >39)
HIV 1 RNA Quant: 198 copies/mL — ABNORMAL HIGH (ref <50)
Hemoglobin: 15 g/dL (ref 12.0–15.0)
LDL Cholesterol: 77 mg/dL (ref 0–99)
MCHC: 33.9 g/dL (ref 30.0–36.0)
MCV: 94 fL (ref 78.0–100.0)
Monocytes Absolute: 0.6 10*3/uL (ref 0.1–1.0)
Monocytes Relative: 12 % (ref 3–12)
Neutrophils Relative %: 32 % — ABNORMAL LOW (ref 43–77)
RBC: 4.7 M/uL (ref 3.87–5.11)
RDW: 14.3 % (ref 11.5–15.5)
Total CHOL/HDL Ratio: 4
VLDL: 53 mg/dL — ABNORMAL HIGH (ref 0–40)

## 2007-11-12 ENCOUNTER — Telehealth (INDEPENDENT_AMBULATORY_CARE_PROVIDER_SITE_OTHER): Payer: Self-pay | Admitting: *Deleted

## 2007-12-15 ENCOUNTER — Ambulatory Visit (HOSPITAL_COMMUNITY): Admission: RE | Admit: 2007-12-15 | Discharge: 2007-12-15 | Payer: Self-pay | Admitting: Infectious Disease

## 2007-12-15 ENCOUNTER — Ambulatory Visit: Payer: Self-pay | Admitting: Infectious Disease

## 2007-12-15 DIAGNOSIS — R7309 Other abnormal glucose: Secondary | ICD-10-CM

## 2007-12-15 DIAGNOSIS — E782 Mixed hyperlipidemia: Secondary | ICD-10-CM

## 2007-12-15 DIAGNOSIS — M79609 Pain in unspecified limb: Secondary | ICD-10-CM

## 2007-12-15 HISTORY — DX: Mixed hyperlipidemia: E78.2

## 2007-12-16 LAB — CONVERTED CEMR LAB
Free T4: 2.05 ng/dL — ABNORMAL HIGH (ref 0.89–1.80)
Rhuematoid fact SerPl-aCnc: <20 intl units/mL (ref 0–20)

## 2007-12-18 ENCOUNTER — Encounter (HOSPITAL_COMMUNITY): Admission: RE | Admit: 2007-12-18 | Discharge: 2008-02-25 | Payer: Self-pay | Admitting: Infectious Disease

## 2007-12-22 ENCOUNTER — Telehealth: Payer: Self-pay | Admitting: Infectious Disease

## 2008-02-03 ENCOUNTER — Ambulatory Visit: Payer: Self-pay | Admitting: Endocrinology

## 2008-02-03 DIAGNOSIS — Z9889 Other specified postprocedural states: Secondary | ICD-10-CM

## 2008-02-03 LAB — CONVERTED CEMR LAB: Hgb A1c MFr Bld: 5.6 % (ref 4.6–6.0)

## 2008-02-06 ENCOUNTER — Encounter: Payer: Self-pay | Admitting: Infectious Disease

## 2008-02-10 ENCOUNTER — Telehealth (INDEPENDENT_AMBULATORY_CARE_PROVIDER_SITE_OTHER): Payer: Self-pay | Admitting: *Deleted

## 2008-03-03 ENCOUNTER — Telehealth (INDEPENDENT_AMBULATORY_CARE_PROVIDER_SITE_OTHER): Payer: Self-pay | Admitting: *Deleted

## 2008-03-31 ENCOUNTER — Encounter: Payer: Self-pay | Admitting: Endocrinology

## 2008-03-31 ENCOUNTER — Telehealth (INDEPENDENT_AMBULATORY_CARE_PROVIDER_SITE_OTHER): Payer: Self-pay | Admitting: *Deleted

## 2008-04-02 ENCOUNTER — Encounter (HOSPITAL_COMMUNITY): Admission: RE | Admit: 2008-04-02 | Discharge: 2008-06-03 | Payer: Self-pay | Admitting: Endocrinology

## 2008-04-08 ENCOUNTER — Encounter (INDEPENDENT_AMBULATORY_CARE_PROVIDER_SITE_OTHER): Payer: Self-pay | Admitting: *Deleted

## 2008-04-08 ENCOUNTER — Encounter: Admission: RE | Admit: 2008-04-08 | Discharge: 2008-04-08 | Payer: Self-pay | Admitting: Oncology

## 2008-04-08 ENCOUNTER — Telehealth (INDEPENDENT_AMBULATORY_CARE_PROVIDER_SITE_OTHER): Payer: Self-pay | Admitting: *Deleted

## 2008-04-13 ENCOUNTER — Encounter: Payer: Self-pay | Admitting: Infectious Disease

## 2008-04-14 ENCOUNTER — Ambulatory Visit (HOSPITAL_COMMUNITY): Admission: RE | Admit: 2008-04-14 | Discharge: 2008-04-14 | Payer: Self-pay | Admitting: Oncology

## 2008-04-16 ENCOUNTER — Ambulatory Visit: Payer: Self-pay | Admitting: Oncology

## 2008-04-20 ENCOUNTER — Ambulatory Visit: Payer: Self-pay | Admitting: Infectious Disease

## 2008-04-20 ENCOUNTER — Encounter: Payer: Self-pay | Admitting: Endocrinology

## 2008-04-20 LAB — COMPREHENSIVE METABOLIC PANEL
ALT: 33 U/L (ref 0–35)
Albumin: 3.5 g/dL (ref 3.5–5.2)
Alkaline Phosphatase: 128 U/L — ABNORMAL HIGH (ref 39–117)
CO2: 25 mEq/L (ref 19–32)
Glucose, Bld: 103 mg/dL — ABNORMAL HIGH (ref 70–99)
Potassium: 3.7 mEq/L (ref 3.5–5.3)
Sodium: 137 mEq/L (ref 135–145)
Total Protein: 7.8 g/dL (ref 6.0–8.3)

## 2008-04-20 LAB — CONVERTED CEMR LAB
ALT: 29 units/L (ref 0–35)
AST: 26 units/L (ref 0–37)
Albumin: 4.1 g/dL (ref 3.5–5.2)
BUN: 16 mg/dL (ref 6–23)
Basophils Relative: 0 % (ref 0–1)
Calcium: 9.9 mg/dL (ref 8.4–10.5)
Chloride: 103 meq/L (ref 96–112)
HDL: 50 mg/dL (ref >39)
HIV 1 RNA Quant: 373 copies/mL — ABNORMAL HIGH (ref <50)
HIV-1 RNA Quant, Log: 2.57 — ABNORMAL HIGH (ref <1.70)
Lymphocytes Relative: 38 % (ref 12–46)
Lymphs Abs: 2.5 10*3/uL (ref 0.7–4.0)
MCHC: 33.6 g/dL (ref 30.0–36.0)
Monocytes Relative: 10 % (ref 3–12)
Neutro Abs: 3.3 10*3/uL (ref 1.7–7.7)
Neutrophils Relative %: 48 % (ref 43–77)
Potassium: 3.8 meq/L (ref 3.5–5.3)
RBC: 4.88 M/uL (ref 3.87–5.11)
WBC: 6.7 10*3/uL (ref 4.0–10.5)

## 2008-04-20 LAB — CBC WITH DIFFERENTIAL/PLATELET
Basophils Absolute: 0 10*3/uL (ref 0.0–0.1)
EOS%: 3.7 % (ref 0.0–7.0)
HGB: 15.3 g/dL (ref 11.6–15.9)
MCH: 31.7 pg (ref 26.0–34.0)
NEUT#: 3 10*3/uL (ref 1.5–6.5)
RDW: 13.5 % (ref 11.3–14.5)
WBC: 4.8 10*3/uL (ref 3.9–10.0)
lymph#: 1.3 10*3/uL (ref 0.9–3.3)

## 2008-04-20 LAB — CANCER ANTIGEN 27.29: CA 27.29: 11 U/mL (ref 0–39)

## 2008-05-06 ENCOUNTER — Ambulatory Visit: Payer: Self-pay | Admitting: Infectious Disease

## 2008-05-06 ENCOUNTER — Telehealth: Payer: Self-pay | Admitting: Infectious Disease

## 2008-05-06 DIAGNOSIS — M6530 Trigger finger, unspecified finger: Secondary | ICD-10-CM

## 2008-05-06 DIAGNOSIS — S72009A Fracture of unspecified part of neck of unspecified femur, initial encounter for closed fracture: Secondary | ICD-10-CM | POA: Insufficient documentation

## 2008-05-06 DIAGNOSIS — L659 Nonscarring hair loss, unspecified: Secondary | ICD-10-CM | POA: Insufficient documentation

## 2008-05-06 HISTORY — DX: Fracture of unspecified part of neck of unspecified femur, initial encounter for closed fracture: S72.009A

## 2008-05-06 HISTORY — DX: Trigger finger, unspecified finger: M65.30

## 2008-05-06 LAB — CONVERTED CEMR LAB: GC Probe Amp, Urine: NEGATIVE

## 2008-05-25 ENCOUNTER — Ambulatory Visit: Payer: Self-pay | Admitting: Endocrinology

## 2008-05-25 LAB — CONVERTED CEMR LAB: Free T4: 1.1 ng/dL (ref 0.6–1.6)

## 2008-06-28 ENCOUNTER — Ambulatory Visit: Payer: Self-pay | Admitting: Endocrinology

## 2008-06-28 DIAGNOSIS — E890 Postprocedural hypothyroidism: Secondary | ICD-10-CM

## 2008-06-28 HISTORY — DX: Postprocedural hypothyroidism: E89.0

## 2008-07-22 ENCOUNTER — Telehealth (INDEPENDENT_AMBULATORY_CARE_PROVIDER_SITE_OTHER): Payer: Self-pay | Admitting: *Deleted

## 2008-07-28 ENCOUNTER — Telehealth: Payer: Self-pay | Admitting: Endocrinology

## 2008-09-03 ENCOUNTER — Telehealth (INDEPENDENT_AMBULATORY_CARE_PROVIDER_SITE_OTHER): Payer: Self-pay | Admitting: *Deleted

## 2008-09-08 ENCOUNTER — Ambulatory Visit: Payer: Self-pay | Admitting: Infectious Disease

## 2008-09-08 LAB — CONVERTED CEMR LAB
AST: 50 units/L — ABNORMAL HIGH (ref 0–37)
Alkaline Phosphatase: 116 units/L (ref 39–117)
BUN: 12 mg/dL (ref 6–23)
Basophils Absolute: 0 10*3/uL (ref 0.0–0.1)
Calcium: 9.8 mg/dL (ref 8.4–10.5)
Creatinine, Ser: 1.08 mg/dL (ref 0.40–1.20)
Eosinophils Absolute: 0.1 10*3/uL (ref 0.0–0.7)
Eosinophils Relative: 2 % (ref 0–5)
HCT: 44.8 % (ref 36.0–46.0)
HDL: 59 mg/dL (ref >39)
LDL Cholesterol: 84 mg/dL (ref 0–99)
MCHC: 34.4 g/dL (ref 30.0–36.0)
MCV: 98 fL (ref 78.0–100.0)
Platelets: 218 10*3/uL (ref 150–400)
RDW: 15.9 % — ABNORMAL HIGH (ref 11.5–15.5)
Total CHOL/HDL Ratio: 2.8
Triglycerides: 123 mg/dL (ref <150)

## 2008-09-23 ENCOUNTER — Ambulatory Visit: Payer: Self-pay | Admitting: Infectious Disease

## 2008-09-23 DIAGNOSIS — S025XXA Fracture of tooth (traumatic), initial encounter for closed fracture: Secondary | ICD-10-CM | POA: Insufficient documentation

## 2008-09-28 ENCOUNTER — Ambulatory Visit: Payer: Self-pay | Admitting: Endocrinology

## 2008-09-30 ENCOUNTER — Telehealth (INDEPENDENT_AMBULATORY_CARE_PROVIDER_SITE_OTHER): Payer: Self-pay | Admitting: *Deleted

## 2008-10-13 ENCOUNTER — Encounter: Payer: Self-pay | Admitting: Infectious Disease

## 2008-10-18 ENCOUNTER — Encounter: Payer: Self-pay | Admitting: Infectious Disease

## 2008-10-29 ENCOUNTER — Encounter: Payer: Self-pay | Admitting: Infectious Disease

## 2008-10-29 ENCOUNTER — Ambulatory Visit: Payer: Self-pay | Admitting: Infectious Disease

## 2008-11-04 DIAGNOSIS — R87619 Unspecified abnormal cytological findings in specimens from cervix uteri: Secondary | ICD-10-CM

## 2008-11-05 ENCOUNTER — Telehealth: Payer: Self-pay | Admitting: Endocrinology

## 2008-11-08 ENCOUNTER — Encounter: Admission: RE | Admit: 2008-11-08 | Discharge: 2008-11-08 | Payer: Self-pay | Admitting: Orthopedic Surgery

## 2008-11-11 ENCOUNTER — Encounter: Payer: Self-pay | Admitting: Obstetrics & Gynecology

## 2008-11-11 ENCOUNTER — Ambulatory Visit: Payer: Self-pay | Admitting: Obstetrics & Gynecology

## 2008-11-23 ENCOUNTER — Telehealth: Payer: Self-pay | Admitting: Infectious Disease

## 2008-12-06 ENCOUNTER — Ambulatory Visit: Payer: Self-pay | Admitting: Obstetrics & Gynecology

## 2008-12-14 ENCOUNTER — Encounter: Payer: Self-pay | Admitting: Infectious Disease

## 2008-12-14 ENCOUNTER — Ambulatory Visit (HOSPITAL_COMMUNITY): Admission: RE | Admit: 2008-12-14 | Discharge: 2008-12-14 | Payer: Self-pay | Admitting: Obstetrics & Gynecology

## 2009-01-13 ENCOUNTER — Telehealth: Payer: Self-pay | Admitting: Internal Medicine

## 2009-01-20 ENCOUNTER — Encounter: Payer: Self-pay | Admitting: Infectious Disease

## 2009-01-20 ENCOUNTER — Ambulatory Visit: Payer: Self-pay | Admitting: Infectious Diseases

## 2009-01-20 LAB — CONVERTED CEMR LAB
CO2: 23 meq/L (ref 19–32)
Calcium: 9.8 mg/dL (ref 8.4–10.5)
Chloride: 103 meq/L (ref 96–112)
Cholesterol: 192 mg/dL (ref 0–200)
Glucose, Bld: 78 mg/dL (ref 70–99)
HDL: 63 mg/dL (ref >39)
HIV-1 RNA Quant, Log: 1.75 — ABNORMAL HIGH (ref <1.68)
Lymphocytes Relative: 43 % (ref 12–46)
Lymphs Abs: 2.3 10*3/uL (ref 0.7–4.0)
MCV: 99.1 fL (ref >=78.0)
Monocytes Relative: 9 % (ref 3–12)
Neutro Abs: 2.2 10*3/uL (ref 1.7–7.7)
Neutrophils Relative %: 42 % — ABNORMAL LOW (ref 43–77)
RBC: 4.37 M/uL (ref 3.87–5.11)
Sodium: 140 meq/L (ref 135–145)
Total Bilirubin: 0.4 mg/dL (ref 0.3–1.2)
Total CHOL/HDL Ratio: 3
Total Protein: 7.9 g/dL (ref 6.0–8.3)
Triglycerides: 160 mg/dL — ABNORMAL HIGH (ref <150)
WBC: 5.3 10*3/uL (ref 4.0–10.5)

## 2009-02-03 ENCOUNTER — Ambulatory Visit: Payer: Self-pay | Admitting: Infectious Disease

## 2009-02-03 ENCOUNTER — Ambulatory Visit: Payer: Self-pay | Admitting: Endocrinology

## 2009-02-03 DIAGNOSIS — N950 Postmenopausal bleeding: Secondary | ICD-10-CM

## 2009-02-03 HISTORY — DX: Postmenopausal bleeding: N95.0

## 2009-02-03 LAB — CONVERTED CEMR LAB
Cholesterol, target level: 200 mg/dL
HDL goal, serum: 40 mg/dL
TSH: 25.04 microintl units/mL — ABNORMAL HIGH (ref 0.35–5.50)

## 2009-02-08 ENCOUNTER — Ambulatory Visit (HOSPITAL_COMMUNITY): Admission: RE | Admit: 2009-02-08 | Discharge: 2009-02-08 | Payer: Self-pay | Admitting: Obstetrics & Gynecology

## 2009-02-08 ENCOUNTER — Telehealth: Payer: Self-pay | Admitting: Infectious Disease

## 2009-02-10 ENCOUNTER — Telehealth: Payer: Self-pay | Admitting: Infectious Disease

## 2009-03-14 ENCOUNTER — Ambulatory Visit (HOSPITAL_COMMUNITY): Admission: RE | Admit: 2009-03-14 | Discharge: 2009-03-14 | Payer: Self-pay | Admitting: Obstetrics & Gynecology

## 2009-03-14 ENCOUNTER — Encounter (INDEPENDENT_AMBULATORY_CARE_PROVIDER_SITE_OTHER): Payer: Self-pay | Admitting: *Deleted

## 2009-03-14 ENCOUNTER — Ambulatory Visit: Payer: Self-pay | Admitting: Obstetrics & Gynecology

## 2009-03-14 ENCOUNTER — Encounter: Payer: Self-pay | Admitting: Obstetrics & Gynecology

## 2009-03-14 LAB — CONVERTED CEMR LAB

## 2009-03-29 ENCOUNTER — Telehealth (INDEPENDENT_AMBULATORY_CARE_PROVIDER_SITE_OTHER): Payer: Self-pay | Admitting: *Deleted

## 2009-04-07 ENCOUNTER — Ambulatory Visit: Payer: Self-pay | Admitting: Obstetrics & Gynecology

## 2009-04-08 ENCOUNTER — Encounter: Admission: RE | Admit: 2009-04-08 | Discharge: 2009-04-08 | Payer: Self-pay | Admitting: Oncology

## 2009-04-11 ENCOUNTER — Ambulatory Visit: Payer: Self-pay | Admitting: Oncology

## 2009-04-13 ENCOUNTER — Ambulatory Visit (HOSPITAL_COMMUNITY): Admission: RE | Admit: 2009-04-13 | Discharge: 2009-04-13 | Payer: Self-pay | Admitting: Oncology

## 2009-04-13 LAB — COMPREHENSIVE METABOLIC PANEL
AST: 42 U/L — ABNORMAL HIGH (ref 0–37)
Albumin: 3.7 g/dL (ref 3.5–5.2)
BUN: 15 mg/dL (ref 6–23)
CO2: 30 mEq/L (ref 19–32)
Calcium: 10.1 mg/dL (ref 8.4–10.5)
Chloride: 99 mEq/L (ref 96–112)
Glucose, Bld: 112 mg/dL — ABNORMAL HIGH (ref 70–99)
Potassium: 3.7 mEq/L (ref 3.5–5.3)

## 2009-04-13 LAB — CBC WITH DIFFERENTIAL/PLATELET
Basophils Absolute: 0 10*3/uL (ref 0.0–0.1)
Eosinophils Absolute: 0.1 10*3/uL (ref 0.0–0.5)
HCT: 43.6 % (ref 34.8–46.6)
HGB: 15.1 g/dL (ref 11.6–15.9)
MONO#: 0.3 10*3/uL (ref 0.1–0.9)
NEUT#: 2.9 10*3/uL (ref 1.5–6.5)
RDW: 13.4 % (ref 11.2–14.5)
lymph#: 1.8 10*3/uL (ref 0.9–3.3)

## 2009-04-19 ENCOUNTER — Ambulatory Visit (HOSPITAL_COMMUNITY): Admission: RE | Admit: 2009-04-19 | Discharge: 2009-04-19 | Payer: Self-pay | Admitting: Oncology

## 2009-04-20 ENCOUNTER — Encounter: Payer: Self-pay | Admitting: Endocrinology

## 2009-04-20 ENCOUNTER — Encounter: Payer: Self-pay | Admitting: Infectious Disease

## 2009-05-16 ENCOUNTER — Ambulatory Visit: Payer: Self-pay | Admitting: Endocrinology

## 2009-05-16 LAB — CONVERTED CEMR LAB: TSH: 2.07 microintl units/mL (ref 0.35–5.50)

## 2009-07-07 ENCOUNTER — Ambulatory Visit: Payer: Self-pay | Admitting: Infectious Disease

## 2009-07-07 LAB — CONVERTED CEMR LAB
ALT: 39 units/L — ABNORMAL HIGH (ref 0–35)
AST: 33 units/L (ref 0–37)
BUN: 19 mg/dL (ref 6–23)
Calcium: 10.7 mg/dL — ABNORMAL HIGH (ref 8.4–10.5)
Creatinine, Ser: 1.03 mg/dL (ref 0.40–1.20)
Eosinophils Absolute: 0.1 10*3/uL (ref 0.0–0.7)
Eosinophils Relative: 2 % (ref 0–5)
HCT: 44 % (ref 36.0–46.0)
HDL: 53 mg/dL (ref >39)
Hemoglobin: 15 g/dL (ref 12.0–15.0)
LDL Cholesterol: 93 mg/dL (ref 0–99)
Lymphocytes Relative: 39 % (ref 12–46)
Lymphs Abs: 2 10*3/uL (ref 0.7–4.0)
MCV: 96.7 fL (ref >=78.0)
Monocytes Absolute: 0.5 10*3/uL (ref 0.1–1.0)
Platelets: 201 10*3/uL (ref 150–400)
RDW: 13.6 % (ref 11.5–15.5)
Total Bilirubin: 0.4 mg/dL (ref 0.3–1.2)
Triglycerides: 100 mg/dL (ref <150)
VLDL: 20 mg/dL (ref 0–40)
WBC: 5.1 10*3/uL (ref 4.0–10.5)

## 2009-07-18 ENCOUNTER — Ambulatory Visit: Payer: Self-pay | Admitting: Infectious Disease

## 2009-07-18 DIAGNOSIS — J00 Acute nasopharyngitis [common cold]: Secondary | ICD-10-CM | POA: Insufficient documentation

## 2009-09-23 ENCOUNTER — Encounter: Admission: RE | Admit: 2009-09-23 | Discharge: 2009-09-23 | Payer: Self-pay | Admitting: Orthopedic Surgery

## 2009-09-26 ENCOUNTER — Telehealth (INDEPENDENT_AMBULATORY_CARE_PROVIDER_SITE_OTHER): Payer: Self-pay | Admitting: *Deleted

## 2009-10-18 ENCOUNTER — Ambulatory Visit: Payer: Self-pay | Admitting: Obstetrics & Gynecology

## 2009-10-25 ENCOUNTER — Ambulatory Visit (HOSPITAL_COMMUNITY): Admission: RE | Admit: 2009-10-25 | Discharge: 2009-10-25 | Payer: Self-pay | Admitting: Obstetrics & Gynecology

## 2009-11-14 ENCOUNTER — Ambulatory Visit: Payer: Self-pay | Admitting: Infectious Disease

## 2009-11-14 LAB — CONVERTED CEMR LAB
ALT: 43 units/L — ABNORMAL HIGH (ref 0–35)
Basophils Relative: 1 % (ref 0–1)
CO2: 27 meq/L (ref 19–32)
Calcium: 10.6 mg/dL — ABNORMAL HIGH (ref 8.4–10.5)
Chloride: 106 meq/L (ref 96–112)
Cholesterol: 164 mg/dL (ref 0–200)
Creatinine, Ser: 1.08 mg/dL (ref 0.40–1.20)
Eosinophils Absolute: 0.1 10*3/uL (ref 0.0–0.7)
Eosinophils Relative: 3 % (ref 0–5)
Glucose, Bld: 126 mg/dL — ABNORMAL HIGH (ref 70–99)
HCT: 42.8 % (ref 36.0–46.0)
HIV-1 RNA Quant, Log: 1.69 — ABNORMAL HIGH (ref <1.68)
Hemoglobin: 13.9 g/dL (ref 12.0–15.0)
Lymphs Abs: 2.3 10*3/uL (ref 0.7–4.0)
MCHC: 32.5 g/dL (ref 30.0–36.0)
MCV: 97.1 fL (ref 78.0–100.0)
Monocytes Absolute: 0.4 10*3/uL (ref 0.1–1.0)
Monocytes Relative: 8 % (ref 3–12)
RBC: 4.41 M/uL (ref 3.87–5.11)
Total CHOL/HDL Ratio: 3.1
Triglycerides: 166 mg/dL — ABNORMAL HIGH (ref <150)
WBC: 5.1 10*3/uL (ref 4.0–10.5)

## 2009-11-17 ENCOUNTER — Ambulatory Visit: Payer: Self-pay | Admitting: Obstetrics & Gynecology

## 2009-11-28 ENCOUNTER — Ambulatory Visit: Payer: Self-pay | Admitting: Infectious Disease

## 2009-11-28 DIAGNOSIS — L639 Alopecia areata, unspecified: Secondary | ICD-10-CM | POA: Insufficient documentation

## 2009-11-28 HISTORY — DX: Alopecia areata, unspecified: L63.9

## 2009-11-28 LAB — CONVERTED CEMR LAB: Hgb A1c MFr Bld: 5.9 % — ABNORMAL HIGH (ref <5.7)

## 2009-12-22 ENCOUNTER — Ambulatory Visit: Payer: Self-pay | Admitting: Obstetrics & Gynecology

## 2009-12-22 ENCOUNTER — Ambulatory Visit (HOSPITAL_COMMUNITY): Admission: RE | Admit: 2009-12-22 | Discharge: 2009-12-22 | Payer: Self-pay | Admitting: Obstetrics & Gynecology

## 2009-12-27 ENCOUNTER — Telehealth: Payer: Self-pay | Admitting: Infectious Disease

## 2010-04-17 ENCOUNTER — Ambulatory Visit: Payer: Self-pay | Admitting: Endocrinology

## 2010-04-21 ENCOUNTER — Encounter: Admission: RE | Admit: 2010-04-21 | Discharge: 2010-04-21 | Payer: Self-pay | Admitting: Oncology

## 2010-04-21 ENCOUNTER — Encounter: Admission: RE | Admit: 2010-04-21 | Discharge: 2010-04-21 | Payer: Self-pay | Admitting: Orthopedic Surgery

## 2010-05-15 ENCOUNTER — Encounter (INDEPENDENT_AMBULATORY_CARE_PROVIDER_SITE_OTHER): Payer: Self-pay | Admitting: *Deleted

## 2010-05-15 ENCOUNTER — Ambulatory Visit: Payer: Self-pay | Admitting: Infectious Disease

## 2010-05-15 LAB — CONVERTED CEMR LAB
AST: 24 units/L (ref 0–37)
Albumin: 4 g/dL (ref 3.5–5.2)
Alkaline Phosphatase: 100 units/L (ref 39–117)
Basophils Absolute: 0 10*3/uL (ref 0.0–0.1)
Basophils Relative: 1 % (ref 0–1)
Chloride: 104 meq/L (ref 96–112)
Cholesterol: 176 mg/dL (ref 0–200)
Glucose, Bld: 106 mg/dL — ABNORMAL HIGH (ref 70–99)
HDL: 50 mg/dL (ref >39)
HIV-1 RNA Quant, Log: 1.4 — ABNORMAL HIGH (ref <1.30)
MCHC: 33.9 g/dL (ref 30.0–36.0)
Neutro Abs: 3 10*3/uL (ref 1.7–7.7)
Neutrophils Relative %: 53 % (ref 43–77)
Potassium: 4.5 meq/L (ref 3.5–5.3)
RDW: 13.4 % (ref 11.5–15.5)
Sodium: 142 meq/L (ref 135–145)
Total Protein: 7.5 g/dL (ref 6.0–8.3)
Triglycerides: 163 mg/dL — ABNORMAL HIGH (ref <150)

## 2010-05-24 ENCOUNTER — Telehealth: Payer: Self-pay | Admitting: Infectious Disease

## 2010-05-29 ENCOUNTER — Ambulatory Visit: Payer: Self-pay | Admitting: Infectious Disease

## 2010-06-23 ENCOUNTER — Emergency Department (HOSPITAL_COMMUNITY)
Admission: EM | Admit: 2010-06-23 | Discharge: 2010-06-23 | Payer: Self-pay | Source: Home / Self Care | Admitting: Family Medicine

## 2010-07-13 ENCOUNTER — Telehealth (INDEPENDENT_AMBULATORY_CARE_PROVIDER_SITE_OTHER): Payer: Self-pay | Admitting: *Deleted

## 2010-07-15 ENCOUNTER — Encounter: Payer: Self-pay | Admitting: Oncology

## 2010-07-16 ENCOUNTER — Encounter: Payer: Self-pay | Admitting: Endocrinology

## 2010-07-16 ENCOUNTER — Encounter: Payer: Self-pay | Admitting: Oncology

## 2010-07-17 ENCOUNTER — Encounter
Admission: RE | Admit: 2010-07-17 | Discharge: 2010-07-17 | Payer: Self-pay | Source: Home / Self Care | Attending: Orthopedic Surgery | Admitting: Orthopedic Surgery

## 2010-07-17 ENCOUNTER — Encounter: Payer: Self-pay | Admitting: Infectious Disease

## 2010-07-17 ENCOUNTER — Encounter: Payer: Self-pay | Admitting: Endocrinology

## 2010-07-21 ENCOUNTER — Encounter (INDEPENDENT_AMBULATORY_CARE_PROVIDER_SITE_OTHER): Payer: Self-pay | Admitting: *Deleted

## 2010-07-27 NOTE — Assessment & Plan Note (Signed)
Summary: F/U OV/VS   Visit Type:  Follow-up Referring Provider:  Daiva Eves Primary Provider:  Paulette Blanch Dam MD  CC:  f/u  and Hypertension Management.  History of Present Illness: 55 yo [place] American  lady with HIV with perfect virological suppresion on raltegravir and atripla, healthy CD4 count. SHe is undergoing workup and treatment by Gynecology for postmenopausal bleeding. Apparently Dr. Marice Potter is going to need to use anesthesia. She was reluctant to go in for that procedure but I encouraged her to do so. She continues to complain of alopecia and dry scalp and requests steroid spray to be used despite fact that corticosteroids have not been especially helpful in the past. 40 minutes spent with this pt incluidng over 20 minutes of face to face counselling  Hypertension History:      Positive major cardiovascular risk factors include hyperlipidemia, hypertension, and family history for ischemic heart disease (males less than 33 years old).  Negative major cardiovascular risk factors include female age less than 70 years old and non-tobacco-user status.        Positive history for target organ damage include peripheral vascular disease.  Further assessment for target organ damage reveals no history of ASHD or stroke/TIA.    Problems Prior to Update: 1)  Acute Nasopharyngitis  (ICD-460) 2)  Menorrhagia, Postmenopausal  (ICD-627.1) 3)  Abnormal Glandular Papanicolaou Smear of Cervix  (ICD-795.00) 4)  Screening For Malignant Neoplasm of The Cervix  (ICD-V76.2) 5)  Broken Tooth, With Complication  (ICD-873.73) 6)  Hypothyroidism, Post-radiation  (ICD-244.1) 7)  Trigger Finger  (ICD-727.03) 8)  Hip Fracture, Right  (ICD-820.8) 9)  Alopecia  (ICD-704.00) 10)  Arthroscopy, Left Knee, Hx of  (ICD-V45.89) 11)  Hyperglycemia, Fasting  (ICD-790.29) 12)  Hyperlipidemia, Mixed  (ICD-272.2) 13)  Hand Pain, Right  (ICD-729.5) 14)  Unspecified Vitamin D Deficiency  (ICD-268.9) 15)  Preventive  Health Care  (ICD-V70.0) 16)  Tinea Capitis  (ICD-110.0) 17)  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 One Pill A Day 2)  Isentress 400 Mg  Tabs (Raltegravir Potassium) .... Take 1 Tablet By Mouth Two Times A Day 3)  Ketoconazole 2 %  Sham (Ketoconazole) .... Apply Twice Weekly For 4 Weeks With 3 Days Between Each Application 4)  Lac-Hydrin 12 %  Lotn (Ammonium Lactate) .... Apply Bid 5)  Claritin 10 Mg  Tabs (Loratadine) .... Take 1 Tablet By Mouth Once A Day 6)  Vitamin D3 400 Unit  Tabs (Cholecalciferol) .... Take Two Tablets Daily 7)  Lipitor 10 Mg Tabs (Atorvastatin Calcium) .... Take 1 Tablet By Mouth Once A Day 8)  Cleocin 300 Mg Caps (Clindamycin Hcl) .... Take 1 Capsule By Mouth Four Times A Day For 10 Days 9)  Oxycodone-Acetaminophen 5-325 Mg Tabs (Oxycodone-Acetaminophen) .... Take One Tablet As Needed Per Day For Pain 10)  Loratadine 10 Mg Tabs (Loratadine) .... Take 1 Tablet By Mouth Once A Day During Allergy Season 11)  Joint Health 750-375-30 Mg Tabs (Glucosamine-Msm-Hyaluronic Acd) .... Take 1 Tablet By Mouth Once A Day 12)  Levothyroxine Sodium 175 Mcg Tabs (Levothyroxine Sodium) .Marland Kitchen.. 1 Qd 13)  Benicar Hct 40-25 Mg Tabs (Olmesartan Medoxomil-Hctz) .... Take 1 Tablet By Mouth Once A Day 14)  Ultram 50 Mg Tabs (Tramadol Hcl) .Marland Kitchen.. 1 Every 4-6 Hours As Needed For Pain 15)  Gabapentin 300 Mg Caps (Gabapentin) .... Take One Tab At Bedtime and May Increase To Two Caps At Bedtime  Current Medications (verified): 1)  Atripla 600-200-300 Mg Tabs (Efavirenz-Emtricitab-Tenofovir) .... One Pill A Day 2)  Isentress 400 Mg  Tabs (Raltegravir Potassium) .... Take 1 Tablet By Mouth Two Times A  Day 3)  Ketoconazole 2 %  Sham (Ketoconazole) .... Apply Twice Weekly For 4 Weeks With 3 Days Between Each Application 4)  Lac-Hydrin 12 %  Lotn (Ammonium Lactate) .... Apply Bid 5)  Claritin 10 Mg  Tabs (Loratadine) .... Take 1 Tablet By Mouth Once A Day 6)  Vitamin D3 400 Unit  Tabs (Cholecalciferol) .... Take Two Tablets Daily 7)  Lipitor 10 Mg Tabs (Atorvastatin Calcium) .... Take 1 Tablet By Mouth Once A Day 8)  Cleocin 300 Mg Caps (Clindamycin Hcl) .... Take 1 Capsule By Mouth Four Times A Day For 10 Days 9)  Oxycodone-Acetaminophen 5-325 Mg Tabs (Oxycodone-Acetaminophen) .... Take One Tablet As Needed Per Day For Pain 10)  Loratadine 10 Mg Tabs (Loratadine) .... Take 1 Tablet By Mouth Once A Day During Allergy Season 11)  Joint Health 750-375-30 Mg Tabs (Glucosamine-Msm-Hyaluronic Acd) .... Take 1 Tablet By Mouth Once A Day 12)  Levothyroxine Sodium 175 Mcg Tabs (Levothyroxine Sodium) .Marland Kitchen.. 1 Qd 13)  Benicar Hct 40-25 Mg Tabs (Olmesartan Medoxomil-Hctz) .... Take 1 Tablet By Mouth Once A Day 14)  Ultram 50 Mg Tabs (Tramadol Hcl) .Marland Kitchen.. 1 Every 4-6 Hours As Needed For Pain 15)  Gabapentin 300 Mg Caps (Gabapentin) .... Take One Tab At Bedtime and May Increase To Two Caps At Bedtime 16)  Hydrocortisone Butyrate 0.1 % Soln (Hydrocortisone Butyrate) .... Apply Three To Four Times Daily, Dispense One Bottle  Allergies: 1)  ! * Lisinopril    Current Allergies (reviewed today): ! * LISINOPRIL Past History:  Past Medical History: Last updated: 02/03/2009 ARTHROSCOPY, LEFT KNEE, HX OF (ICD-V45.89) HYPERGLYCEMIA, FASTING (ICD-790.29) HYPERLIPIDEMIA, MIXED (ICD-272.2) HAND PAIN, RIGHT (ICD-729.5) UNSPECIFIED VITAMIN D DEFICIENCY (ICD-268.9) HYPERTHYROIDISM, SUBCLINICAL (ICD-242.90) PREVENTIVE HEALTH CARE (ICD-V70.0) TINEA CAPITIS (ICD-110.0) CNTC DERMATITIS&OTH ECZEMA DUE OTH CHEM PRODUCTS (ICD-692.4) OSTEOARTHROSIS, LOCAL, SCND, UNSPC SITE (ICD-715.20) PVD (ICD-443.9) HYPERTENSION  (ICD-401.9) HIV DISEASE (ICD-042) BREAST CANCER, HX OF (ICD-V10.3) ALOpecia  Past Surgical History: Last updated: 02/03/2008 Lumpectomy Tubal ligation Lymph Node Dissection (2005) Port-a-cath Insertion (2005 Removed 2006)  Family History: Last updated: 09/23/2008 brother with early CAD with massive MI in 56s sister had i-131 rx of hyperthyroidism dm: mother and 2 sisters  Social History: Last updated: 02/03/2008 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 brother with early CAD with massive MI in 13s sister had i-131 rx of hyperthyroidism dm: mother and 2 sisters  Social History: Reviewed history from 02/03/2008 and no changes required. Single Drug use-no works [employer], political party  Review of Systems  The patient denies anorexia, fever, weight loss, weight gain, vision loss, decreased hearing, hoarseness, chest pain, syncope, dyspnea on exertion, peripheral edema, prolonged cough, headaches, hemoptysis, abdominal pain, melena, hematochezia, severe indigestion/heartburn, hematuria, incontinence, genital sores, muscle weakness, suspicious skin lesions, transient blindness, difficulty walking, depression, unusual weight change, abnormal bleeding, and enlarged lymph nodes.    Vital Signs:  Patient profile:  55 year old female Menstrual status:  postmenopausal Height:      67 inches (170.18 cm) Weight:      271.50 pounds (123.41 kg) BMI:     42.68 Temp:     97.6 degrees F (36.44 degrees C) oral Pulse rate:   86 / minute BP sitting:   119 / 83  (left arm)  Vitals Entered By: Starleen Arms CMA (November 28, 2009 10:26 AM) CC: f/u , Hypertension Management Is Patient Diabetic? Yes Did you bring your meter with  you today? No Pain Assessment Patient in pain? no      Nutritional Status BMI of > 30 = obese  Does patient need assistance? Functional Status Self care Ambulation Normal   Physical Exam  General:  alert.  well-developed and well-nourished.   Head:  normocephalic, atraumatic, has alopecia with scaling on scalp Eyes:  vision grossly intact, pupils equal, pupils round, and pupils reactive to light.   Ears:  no external deformities and ear piercing(s) noted.   Nose:  no external erythema anminimal exteranl drainge, slightly boggy mucosa Mouth:  pharynx pink and moist, no erythema, and no exudates.   Neck:  supple and full ROM.   Lungs:  normal respiratory effort, no dullness, no crackles, and no wheezes.   Heart:  normal rate, regular rhythm, no murmur, no gallop, and no rub.   Abdomen:  soft, non-tender, normal bowel sounds, and no distention.   Msk:  No deformity or scoliosis noted of thoracic or lumbar spine.   Extremities:  trace left pedal edema and trace right pedal edema.   Neurologic:  alert & oriented X3, strength normal in all extremities, and gait normal.   Skin:  color normal and no rashes.   Psych:  Oriented X3, memory intact for recent and remote, and normally interactive.          Medication Adherence: 11/28/2009   Adherence to medications reviewed with patient. Counseling to provide adequate adherence provided   Prevention For Positives: 11/28/2009   Safe sex practices discussed with patient. Condoms offered.   Education Materials Provided: 11/28/2009 Safe sex practices discussed with patient. Condoms offered.                          Impression & Recommendations:  Problem # 1:  HIV DISEASE (ICD-042)  Excellent control The following medications were removed from the medication list:    Cleocin 300 Mg Caps (Clindamycin hcl) .Marland Kitchen... Take 1 capsule by mouth four times a day for 10 days  Her updated medication list for this problem includes:    Cleocin  300 Mg Caps (Clindamycin hcl) .Marland Kitchen... Take 1 capsule by mouth four times a day for 10 days  Diagnostics Reviewed:  HIV: HIV positive - not AIDS (05/06/2008)   CD4: 790 (11/15/2009)   WBC: 5.1 (11/14/2009)   Hgb: 13.9 (11/14/2009)   HCT: 42.8 (11/14/2009)   Platelets: 229 (11/14/2009) HIV-1 RNA: 49 (11/14/2009)   HBSAg: No (08/19/2006)  Orders: Est. Patient Level V (16109)  Problem # 2:  HYPERGLYCEMIA (ICD-790.29) Assessment: New bg elevated on labs. check a1c Orders: T- Hemoglobin A1C ([id number])  Problem # 3:  MENORRHAGIA, POSTMENOPAUSAL (ICD-627.1)  to have hysteroscopy at [facility]  Orders: Est. Patient Level V (60454)  Problem # 4:  HYPOTHYROIDISM, POST-RADIATION (ICD-244.1)  Needs to followup with endocrine and needs tsh rechecked Her updated medication list for this problem includes:    Levothyroxine Sodium 175 Mcg Tabs (Levothyroxine  sodium) .Marland Kitchen... 1 qd  Labs Reviewed: TSH: 2.07 (05/16/2009)   Free T4: 0.1 (06/28/2008)   Total T3: 271.2 (12/15/2007) HgBA1c: 5.6 (02/03/2008) Chol: 164 (11/14/2009)   HDL: 53 (11/14/2009)   LDL: 78 (11/14/2009)   TG: 166 (11/14/2009)  Orders: Est. Patient Level V (16109)  Problem # 5:  HYPERTENSION (ICD-401.9)  well controlled Her updated medication list for this problem includes:    Benicar Hct 40-25 Mg Tabs (Olmesartan medoxomil-hctz) .Marland Kitchen... Take 1 tablet by mouth once a day  BP today: 119/83 Prior BP: 135/85 (07/18/2009)  Prior 10 Yr Risk Heart Disease: 5 % (02/03/2009)  Labs Reviewed: K+: 4.7 (11/14/2009) Creat: : 1.08 (11/14/2009)   Chol: 164 (11/14/2009)   HDL: 53 (11/14/2009)   LDL: 78 (11/14/2009)   TG: 166 (11/14/2009)  Orders: Est. Patient Level V (60454)  Problem # 6:  ALOPECIA (ICD-704.00)  she has tried antifungal therapy, as well as steroids. She is seeing a Dermatoloigst AT [facility] this August but wiould like to try steroid spary in interim so wrote for hydcortisone topical for now.  Orders: Est. Patient Level V  (09811)  Medications Added to Medication List This Visit: 1)  Hydrocortisone Butyrate 0.1 % Soln (Hydrocortisone butyrate) .... Apply three to four times daily, dispense one bottle  Other Orders: Future Orders: T-CD4SP ([facility]) (CD4SP) ... 05/27/2010 T-HIV Viral Load [id number]) ... 05/27/2010 T-CBC w/Diff ([id number]) ... 05/27/2010 T-Comprehensive Metabolic Panel [phone]) ... 05/27/2010 T-RPR (Syphilis) [phone]) ... 05/27/2010 T-Lipid Profile [id number]) ... 05/27/2010  Hypertension Assessment/Plan:      The patient's hypertensive risk group is category C: Target organ damage and/or diabetes.  Her calculated 10 year risk of coronary heart disease is 5 %.  Today's blood pressure is 119/83.  Her blood pressure goal is < 140/90.   Patient Instructions: 1)  rtc in 6 months   Prescriptions: HYDROCORTISONE BUTYRATE 0.1 % SOLN (HYDROCORTISONE BUTYRATE) apply three to four times daily, dispense one bottle  #1 x 4   Entered by:   Starleen Arms CMA   Authorized by:   Acey Lav MD   Signed by:   Starleen Arms CMA on 11/28/2009   Method used:   Electronically to        [facility] (retail)       [address]       [place], [place]  [id number]       Ph: [phone]       Fax: [phone]   RxID:   [id number] LIPITOR 10 MG TABS (ATORVASTATIN CALCIUM) Take 1 tablet by mouth once a day  #30 x 11   Entered by:   Starleen Arms CMA   Authorized by:   Acey Lav MD   Signed by:   Starleen Arms CMA on 11/28/2009   Method used:   Electronically to        [facility] (retail)       [address]       [place], [place]  [id number]       Ph: [phone]       Fax: [phone]   RxID:   [id number] HYDROCORTISONE BUTYRATE 0.1 % SOLN (HYDROCORTISONE BUTYRATE) apply three to four times daily, dispense one bottle  #1 x 4   Entered and Authorized by:   Acey Lav MD   Signed by:    Paulette Blanch Dam MD on 11/28/2009   Method used:   Electronically to  [facility] [address] [phone]* (retail)       [address]       [place], [place]  [address]       Ph: [phone] or [phone]       Fax: [phone]   RxID:   [id number] LIPITOR 10 MG TABS (ATORVASTATIN CALCIUM) Take 1 tablet by mouth once a day  #30 x 11   Entered and Authorized by:   Acey Lav MD   Signed by:   Paulette Blanch Dam MD on 11/28/2009   Method used:   Electronically to        [facility] [address] [phone]* (retail)       [address]       [place], [place]  [address]       Ph: [phone] or [phone]       Fax: [phone]   RxID:   [id number]   Appended Document: Orders Update    Clinical Lists Changes  Orders: Added new Test order of T-TSH [phone]) - Signed

## 2010-07-27 NOTE — Miscellaneous (Signed)
Summary: 03/14/2009 - CONE EXCISION - NO SIL OR ATYPIA  cClinical Lists Changes  Observations: Added new observation of PAP SMEAR: CONE EXCISION - NO SIL OR ATPIA NOTED (03/14/2009 11:22) Added new observation of LAST PAP DAT: 03/14/2009 (03/14/2009 11:22)

## 2010-07-27 NOTE — Assessment & Plan Note (Signed)
Summary: PER PT FU  D/T  STC   Vital Signs:  Patient profile:   55 year old female Menstrual status:  postmenopausal Height:      67 inches (170.18 cm) Weight:      257.25 pounds (116.93 kg) BMI:     40.44 O2 Sat:      95 % on Room air Temp:     97.9 degrees F (36.61 degrees C) oral Pulse rate:   74 / minute BP sitting:   124 / 76  (left arm) Cuff size:   large  Vitals Entered By: Brenton Grills MA (April 17, 2010 4:25 PM)  O2 Flow:  Room air CC: Follow-up visit/pt is no longer taking Vitamin D, Oxycodone-APAP, Hydrocortisone cream, Ultram, Gabapentin, or Joint Health/aj Is Patient Diabetic? No   Referring Provider:  Daiva Eves Primary Provider:  Paulette Blanch Dam MD  CC:  Follow-up visit/pt is no longer taking Vitamin D, Oxycodone-APAP, Hydrocortisone cream, Ultram, Gabapentin, and or Joint Health/aj.  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  she feels well on synthroid 175 micrograms/day.    Current Medications (verified): 1)  Atripla 600-200-300 Mg Tabs (Efavirenz-Emtricitab-Tenofovir) .... One Pill A Day 2)  Isentress 400 Mg  Tabs (Raltegravir Potassium) .... Take 1 Tablet By Mouth Two Times A Day 3)  Ketoconazole 2 %  Sham (Ketoconazole) .... Apply Twice Weekly For 4 Weeks With 3 Days Between Each Application 4)  Lac-Hydrin 12 %  Lotn (Ammonium Lactate) .... Apply Bid 5)  Claritin 10 Mg  Tabs (Loratadine) .... Take 1 Tablet By Mouth Once A Day 6)  Vitamin D3 400 Unit  Tabs (Cholecalciferol) .... Take Two Tablets Daily 7)  Lipitor 10 Mg Tabs (Atorvastatin Calcium) .... Take 1 Tablet By Mouth Once A Day 8)  Oxycodone-Acetaminophen 5-325 Mg Tabs (Oxycodone-Acetaminophen) .... Take One Tablet As Needed Per Day For Pain 9)  Loratadine 10 Mg Tabs (Loratadine) .... Take 1 Tablet By Mouth Once A Day During Allergy Season 10)  Joint Health 750-375-30 Mg Tabs (Glucosamine-Msm-Hyaluronic Acd) .... Take 1 Tablet By Mouth Once A  Day 11)  Levothyroxine Sodium 175 Mcg Tabs (Levothyroxine Sodium) .Marland Kitchen.. 1 Qd 12)  Benicar Hct 40-25 Mg Tabs (Olmesartan Medoxomil-Hctz) .... Take 1 Tablet By Mouth Once A Day 13)  Ultram 50 Mg Tabs (Tramadol Hcl) .Marland Kitchen.. 1 Every 4-6 Hours As Needed For Pain 14)  Gabapentin 300 Mg Caps (Gabapentin) .... Take One Tab At Bedtime and May Increase To Two Caps At Bedtime 15)  Hydrocortisone Butyrate 0.1 % Soln (Hydrocortisone Butyrate) .... Apply Three To Four Times Daily, Dispense One Bottle 16)  Womens 50+ Multi Vitamin/min  Tabs (Multiple Vitamins-Minerals) .Marland Kitchen.. 1 By Mouth Once Daily 17)  Aleve 220 Mg Caps (Naproxen Sodium) .... As Needed For Pain 18)  Clobetasol Propionate 0.05 % Crea (Clobetasol Propionate) .... Apply To Affected Area Two Times A Day  Allergies: 1)  ! * Lisinopril  Past History:  Past Medical History: Last updated: 02/03/2009 ARTHROSCOPY, LEFT KNEE, HX OF (ICD-V45.89) HYPERGLYCEMIA, FASTING (ICD-790.29) HYPERLIPIDEMIA, MIXED (ICD-272.2) HAND PAIN, RIGHT (ICD-729.5) UNSPECIFIED VITAMIN D DEFICIENCY (ICD-268.9) HYPERTHYROIDISM, SUBCLINICAL (ICD-242.90) PREVENTIVE HEALTH CARE (ICD-V70.0) TINEA CAPITIS (ICD-110.0) CNTC DERMATITIS&OTH ECZEMA DUE OTH CHEM PRODUCTS (ICD-692.4) OSTEOARTHROSIS, LOCAL, SCND, UNSPC SITE (ICD-715.20) PVD (ICD-443.9) HYPERTENSION (ICD-401.9) HIV DISEASE (ICD-042) BREAST CANCER, HX OF (ICD-V10.3) ALOpecia  Review of Systems       she has lost a few lbs, due to her efforts  Physical Exam  General:  morbidly obese.  no distress  Eyes:  there is bilat proptosis Neck:  i do not appreciate a goiter Skin:  not diaphporetic Cervical Nodes:  No significant adenopathy.  Psych:  Alert and cooperative; normal mood and affect; normal attention span and concentration.   Additional Exam:   FastTSH              [L]  0.31 uIU/mL     Impression & Recommendations:  Problem # 1:  HYPOTHYROIDISM, POST-RADIATION (ICD-244.1) tsh is very close to  normal  Medications Added to Medication List This Visit: 1)  Womens 50+ Multi Vitamin/min Tabs (Multiple vitamins-minerals) .Marland Kitchen.. 1 by mouth once daily 2)  Aleve 220 Mg Caps (Naproxen sodium) .... As needed for pain 3)  Clobetasol Propionate 0.05 % Crea (Clobetasol propionate) .... Apply to affected area two times a day  Other Orders: TLB-TSH (Thyroid Stimulating Hormone) (84443-TSH) Est. Patient Level III (04540)  Patient Instructions: 1)  blood tests are being ordered for you today.  please call [phone] to hear your test results. 2)  pending the test results, please continue the same medications for now 3)  you should have your thyroid blood test at least once a year.  i would be happy for you to come here to do this, or it would be ok for dr Zenaida Niece dam, or dr dove, to do so instead. 4)  (update: i left message on phone-tree:  rx as we discussed, as tsh is very close to normal).   Orders Added: 1)  TLB-TSH (Thyroid Stimulating Hormone) [84443-TSH] 2)  Est. Patient Level III [78295]

## 2010-07-27 NOTE — Miscellaneous (Signed)
  Clinical Lists Changes  Observations: Added new observation of YEARAIDSPOS: 2006  (05/15/2010 13:40) Added new observation of HIV STATUS: CDC-defined AIDS  (05/15/2010 13:40)

## 2010-07-27 NOTE — Assessment & Plan Note (Signed)
Summary: 3 MONTH CHECKUP/CH   Visit Type:  Follow-up Referring Provider:  Daiva Eves Primary Provider:  Paulette Blanch Dam MD  CC:  f/u labs.  History of Present Illness: 55 yo lady with HIV with perfect virological suppresion on raltegravir and atripla, healthy CD4 count. She has felt relatively well until approxiamtely a  week ago Saturday when she developed runny nose, sneezing settled in chest, cough, feel blood rushing in ears. NO temperature. Was "wiped out" with body aches, and chils for a week. Coughed so hard that chest was hurting. Phlegm was clear then changing colors. Took otc meds including childrens mucinex, nyquil.These symptoms are just starting to clear now. She did undergo successful D and C by Melven Sartorius and colposcopy..   Preventive Screening-Counseling & Management  Alcohol-Tobacco     Alcohol drinks/day: 0     Smoking Status: never     Passive Smoke Exposure: no  Caffeine-Diet-Exercise     Caffeine use/day: 0     Does Patient Exercise: yes     Type of exercise: water aerobics     Exercise (avg: min/session): >60     Times/week: 3   Current Allergies (reviewed today): ! * LISINOPRIL Past History:  Past Medical History: Last updated: 02/03/2009 ARTHROSCOPY, LEFT KNEE, HX OF (ICD-V45.89) HYPERGLYCEMIA, FASTING (ICD-790.29) HYPERLIPIDEMIA, MIXED (ICD-272.2) HAND PAIN, RIGHT (ICD-729.5) UNSPECIFIED VITAMIN D DEFICIENCY (ICD-268.9) HYPERTHYROIDISM, SUBCLINICAL (ICD-242.90) PREVENTIVE HEALTH CARE (ICD-V70.0) TINEA CAPITIS (ICD-110.0) CNTC DERMATITIS&OTH ECZEMA DUE OTH CHEM PRODUCTS (ICD-692.4) OSTEOARTHROSIS, LOCAL, SCND, UNSPC SITE (ICD-715.20) PVD (ICD-443.9) HYPERTENSION (ICD-401.9) HIV DISEASE (ICD-042) BREAST CANCER, HX OF (ICD-V10.3) ALOpecia  Past Surgical History: Last updated: 02/03/2008 Lumpectomy Tubal ligation Lymph Node Dissection (2005) Port-a-cath Insertion (2005 Removed 2006)  Family History: Last updated: 09/23/2008 brother with early  CAD with massive MI in 36s sister had i-131 rx of hyperthyroidism dm: mother and 2 sisters  Social History: Last updated: 02/03/2008 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 brother with early CAD with massive MI in 66s sister had i-131 rx of hyperthyroidism dm: mother and 2 sisters  Social History: Reviewed history from 02/03/2008 and no changes required. 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 No Pain Assessment Patient in pain? no      Nutritional Status BMI of > 30 = obese Nutritional Status Detail nl  Does patient need assistance? Functional Status Self care Ambulation Normal   Physical Exam  General:  alert.  well-developed and well-nourished.   Head:  normocephalic, atraumatic, and no abnormalities observed.   Eyes:  vision grossly intact, pupils equal, pupils round, and pupils reactive to light.   Ears:  no external deformities and ear piercing(s) noted.   Nose:  no external erythema anminimal exteranl drainge, slightly boggy mucosa Mouth:  pharynx pink and moist, no erythema, and no exudates.   Neck:  supple and full ROM.   Lungs:  normal respiratory effort, no  dullness, no crackles, and no wheezes.   Heart:  normal rate, regular rhythm, no murmur, no gallop, and no rub.   Abdomen:  soft, non-tender, normal bowel sounds, and no distention.   Extremities:  trace left  pedal edema and trace right pedal edema.   Neurologic:  alert & oriented X3, strength normal in all extremities, and gait normal.   Skin:  color normal and no rashes.   Cervical Nodes:  few enlarged slighlty tender freelymovable lymph nodes Psych:  Oriented X3, memory intact for recent and remote, and normally interactive.          Medication Adherence: 07/18/2009   Adherence to medications reviewed with patient. Counseling to provide adequate adherence provided   Prevention For Positives: 07/18/2009   Safe sex practices discussed with patient. Condoms offered.   Education Materials Provided: 07/18/2009 Safe sex practices discussed with patient. Condoms offered.                          Impression & Recommendations:  Problem # 1:  HIV DISEASE (ICD-042)  Excellent control! Her updated medication list for this problem includes:    Cleocin 300 Mg Caps (Clindamycin hcl) .Marland Kitchen... Take 1 capsule by mouth four times a day for 10 days  Diagnostics Reviewed:  HIV: HIV positive - not AIDS (05/06/2008)   CD4: 650 (07/08/2009)   WBC: 5.1 (07/07/2009)   Hgb: 15.0 (07/07/2009)   HCT: 44.0 (07/07/2009)   Platelets: 201 (07/07/2009) HIV-1 RNA: <48 copies/mL (07/07/2009)   HBSAg: No (08/19/2006)  Orders: Est. Patient Level IV (54098)  Problem # 2:  MENORRHAGIA, POSTMENOPAUSAL (ICD-627.1) Assessment: Improved  resolved after D and C  Orders: Est. Patient Level IV (11914)  Problem # 3:  ACUTE NASOPHARYNGITIS (ICD-460) Assessment: Improved  Seems viral and resolving Her updated medication list for this problem includes:    Claritin 10 Mg Tabs (Loratadine) .Marland Kitchen... Take 1 tablet by mouth once a day    Loratadine 10 Mg Tabs (Loratadine) .Marland Kitchen... Take 1 tablet by mouth once a day during allergy season  Orders: Est. Patient Level IV (78295)  Problem # 4:  ABNORMAL GLANDULAR PAPANICOLAOU SMEAR OF CERVIX (ICD-795.00) Assessment: Comment Only  sp colposocpy  Orders: Est. Patient Level IV  (62130)  Problem # 5:  HYPERTENSION (ICD-401.9)  Her BP at hom is better controlled per her report. Will not iniatie new therapies today Her updated medication list for this problem includes:    Benicar Hct 40-25 Mg Tabs (Olmesartan medoxomil-hctz) .Marland Kitchen... Take 1 tablet by mouth once a day  BP today: 135/85 Prior BP: 120/92 (05/16/2009)  Prior 10 Yr Risk Heart Disease: 5 % (02/03/2009)  Labs Reviewed: K+: 4.6 (07/07/2009) Creat: : 1.03 (07/07/2009)   Chol: 166 (07/07/2009)   HDL: 53 (07/07/2009)   LDL: 93 (07/07/2009)   TG: 100 (07/07/2009)  Orders: Est. Patient Level IV (86578)  Problem # 6:  HYPOTHYROIDISM, POST-RADIATION (ICD-244.1) Assessment: Comment Only  Folowed by DR. EIllsion Her updated medication list for this problem includes:    Levothyroxine Sodium 175 Mcg Tabs (Levothyroxine sodium) .Marland Kitchen... 1 qd  Labs Reviewed: TSH: 2.07 (05/16/2009)   Free T4: 0.1 (06/28/2008)   Total T3: 271.2 (12/15/2007) HgBA1c: 5.6 (02/03/2008) Chol: 166 (07/07/2009)   HDL: 53 (07/07/2009)   LDL: 93 (07/07/2009)   TG: 100 (07/07/2009)  Orders: Est. Patient Level IV (46962)  Problem # 7:  BREAST CANCER, HX OF (ICD-V10.3) has been diseasr free for > 5 years Orders: Est.  Patient Level IV (09811)  Medications Added to Medication List This Visit: 1)  Oxycodone-acetaminophen 5-325 Mg Tabs (Oxycodone-acetaminophen) .... Take one tablet as needed per day for pain 2)  Ultram 50 Mg Tabs (Tramadol hcl) .Marland Kitchen.. 1 every 4-6 hours as needed for pain 3)  Gabapentin 300 Mg Caps (Gabapentin) .... Take one tab at bedtime and may increase to two caps at bedtime  Other Orders: T-GC Probe, urine [id number]) T-Chlamydia  Probe, urine [id number]) Future Orders: T-HIV Viral Load ([id number]) ... 01/14/2010 T-CBC w/Diff ([id number]) ... 01/14/2010 T-Comprehensive Metabolic Panel [id number] ... 01/14/2010 T-Lipid Profile ([id number]) ... 01/14/2010 T-CD4SP ([facility]) (CD4SP) ... 01/14/2010  Patient  Instructions: 1)  rtc in 6 months with Dr. Daiva Eves   Prescriptions: OXYCODONE-ACETAMINOPHEN 5-325 MG TABS (OXYCODONE-ACETAMINOPHEN) take one tablet as needed per day for pain  #30 x 0   Entered and Authorized by:   Acey Lav MD   Signed by:   Paulette Blanch Dam MD on 07/18/2009   Method used:   Print then Give to Patient   RxID:   [id number] GABAPENTIN 300 MG CAPS (GABAPENTIN) take one tab at bedtime and may increase to two caps at bedtime  #60 x 4   Entered and Authorized by:   Acey Lav MD   Signed by:   Paulette Blanch Dam MD on 07/18/2009   Method used:   Print then Give to Patient   RxID:   [id number]  Process Orders Check Orders Results:     [facility]: ABN not required for this insurance Tests Sent for requisitioning (July 19, 2009 12:34 AM):     07/18/2009: [facility] -- T-GC Probe, urine [phone] (signed)     07/18/2009: [facility] -- T-Chlamydia  Probe, urine [phone] (signed)     01/14/2010: [facility] -- T-HIV Viral Load [phone] (signed)     01/14/2010: [facility] -- T-CBC w/Diff [[id number]] (signed)     01/14/2010: [facility] -- T-Comprehensive Metabolic Panel [[id number]] (signed)     01/14/2010: [facility] -- T-Lipid Profile [phone] (signed)   Appended Document: 3 MONTH CHECKUP/CH  I wrote scripts for gapapentin to see if this would help with her neuropahtic pain, and refilled her oxycodone for chronic back pain

## 2010-07-27 NOTE — Progress Notes (Signed)
  Phone Note Other Incoming   Request: Send information Summary of Call: Request received from [employer] forwarded to [employer].       

## 2010-07-27 NOTE — Progress Notes (Signed)
Summary: Refill  Phone Note Call from Patient   Caller: Patient Call For: Acey Lav MD Reason for Call: Refill Medication Details for Reason: Pt. requesting refill for Benicar Summary of Call: Pt. called requesting refill for Benicar 40/25 for a 30 day supply.  Will not be able to get it from [facility] in time. Initial call taken by: Wendall Mola CMA Duncan Dull),  December 27, 2009 9:36 AM

## 2010-07-27 NOTE — Assessment & Plan Note (Signed)
Summary: F/U [MKJ]   Referring Provider:  Daiva Eves Primary Provider:  Paulette Blanch Dam MD  CC:  f/u and wants different bp med bc of high co pay.  History of Present Illness: 55 yo [place] American  lady with HIV with perfect virological suppresion on raltegravir and atripla, healthy CD4 count. She has  had two  D and Cs by Dr. Marice Potter for postmenopausal bleeing.  She has been seen by Dr. Everardo All after her post thyroid ablation tfts and continues on levothryoxine. her alopecia has improved with help from [facility] from [place]. She has lost >20# of weight with water aerobics and diet. she is having hard time affording the ARB/HCTZ combo due to copay. (she had angioedema with ACEI apparnetly) I spent greater than 45 minutes with pt inuding greater than 50%  of face to face counselling pt.[facility]  Problems Prior to Update: 1)  Alopecia  (ICD-704.00) 2)  Alopecia Areata  (ICD-704.01) 3)  Hyperglycemia  (ICD-790.29) 4)  Acute Nasopharyngitis  (ICD-460) 5)  Menorrhagia, Postmenopausal  (ICD-627.1) 6)  Abnormal Glandular Papanicolaou Smear of Cervix  (ICD-795.00) 7)  Screening For Malignant Neoplasm of The Cervix  (ICD-V76.2) 8)  Broken Tooth, With Complication  (ICD-873.73) 9)  Hypothyroidism, Post-radiation  (ICD-244.1) 10)  Trigger Finger  (ICD-727.03) 11)  Hip Fracture, Right  (ICD-820.8) 12)  Alopecia  (ICD-704.00) 13)  Arthroscopy, Left Knee, Hx of  (ICD-V45.89) 14)  Hyperglycemia, Fasting  (ICD-790.29) 15)  Hyperlipidemia, Mixed  (ICD-272.2) 16)  Hand Pain, Right  (ICD-729.5) 17)  Unspecified Vitamin D Deficiency  (ICD-268.9) 18)  Preventive Health Care  (ICD-V70.0) 19)  Tinea Capitis  (ICD-110.0) 20)  Cntc Dermatitis&oth Eczema Due Oth Chem Products  (ICD-692.4) 21)  Osteoarthrosis, Local, Scnd, Unspc Site  (ICD-715.20) 22)  Pvd  (ICD-443.9) 23)  Hypertension  (ICD-401.9) 24)  HIV Disease  (ICD-042) 25)  Breast Cancer, Hx of  (ICD-V10.3)  Medications Prior  to Update: 1)  Atripla 600-200-300 Mg Tabs (Efavirenz-Emtricitab-Tenofovir) .... One Pill A Day 2)  Isentress 400 Mg  Tabs (Raltegravir Potassium) .... Take 1 Tablet By Mouth Two Times A Day 3)  Ketoconazole 2 %  Sham (Ketoconazole) .... Apply Twice Weekly For 4 Weeks With 3 Days Between Each Application 4)  Lac-Hydrin 12 %  Lotn (Ammonium Lactate) .... Apply Bid 5)  Claritin 10 Mg  Tabs (Loratadine) .... Take 1 Tablet By Mouth Once A Day 6)  Lipitor 10 Mg Tabs (Atorvastatin Calcium) .... Take 1 Tablet By Mouth Once A Day 7)  Loratadine 10 Mg Tabs (Loratadine) .... Take 1 Tablet By Mouth Once A Day During Allergy Season 8)  Levothyroxine Sodium 175 Mcg Tabs (Levothyroxine Sodium) .Marland Kitchen.. 1 Qd 9)  Benicar Hct 40-25 Mg Tabs (Olmesartan Medoxomil-Hctz) .... Take 1 Tablet By Mouth Once A Day 10)  Ultram 50 Mg Tabs (Tramadol Hcl) .Marland Kitchen.. 1 Every 4-6 Hours As Needed For Pain 11)  Womens 50+ Multi Vitamin/min  Tabs (Multiple Vitamins-Minerals) .Marland Kitchen.. 1 By Mouth Once Daily 12)  Aleve 220 Mg Caps (Naproxen Sodium) .... As Needed For Pain 13)  Clobetasol Propionate 0.05 % Crea (Clobetasol Propionate) .... Apply To Affected Area Two Times A Day  Current Medications (verified): 1)  Atripla 600-200-300 Mg Tabs (Efavirenz-Emtricitab-Tenofovir) .... One Pill A Day 2)  Isentress 400 Mg  Tabs (Raltegravir Potassium) .... Take 1 Tablet By Mouth Two Times A Day 3)  Ketoconazole 2 %  Sham (Ketoconazole) .... Apply Twice Weekly For 4 Weeks With 3 Days Between  Each Application 4)  Lac-Hydrin 12 %  Lotn (Ammonium Lactate) .... Apply Bid 5)  Claritin 10 Mg  Tabs (Loratadine) .... Take 1 Tablet By Mouth Once A Day 6)  Lipitor 10 Mg Tabs (Atorvastatin Calcium) .... Take 1 Tablet By Mouth Once A Day 7)  Loratadine 10 Mg Tabs (Loratadine) .... Take 1 Tablet By Mouth Once A Day During Allergy Season 8)  Levothyroxine Sodium 175 Mcg Tabs (Levothyroxine Sodium) .Marland Kitchen.. 1 Qd 9)  Ultram 50 Mg Tabs (Tramadol Hcl) .Marland Kitchen.. 1 Every 4-6 Hours  As Needed For Pain 10)  Womens 50+ Multi Vitamin/min  Tabs (Multiple Vitamins-Minerals) .Marland Kitchen.. 1 By Mouth Once Daily 11)  Aleve 220 Mg Caps (Naproxen Sodium) .... As Needed For Pain 12)  Clobetasol Propionate 0.05 % Crea (Clobetasol Propionate) .... Apply To Affected Area Two Times A Day  Allergies: 1)  ! * Lisinopril    Current Allergies (reviewed today): ! * LISINOPRIL Past History:  Past Medical History: Last updated: 02/03/2009 ARTHROSCOPY, LEFT KNEE, HX OF (ICD-V45.89) HYPERGLYCEMIA, FASTING (ICD-790.29) HYPERLIPIDEMIA, MIXED (ICD-272.2) HAND PAIN, RIGHT (ICD-729.5) UNSPECIFIED VITAMIN D DEFICIENCY (ICD-268.9) HYPERTHYROIDISM, SUBCLINICAL (ICD-242.90) PREVENTIVE HEALTH CARE (ICD-V70.0) TINEA CAPITIS (ICD-110.0) CNTC DERMATITIS&OTH ECZEMA DUE OTH CHEM PRODUCTS (ICD-692.4) OSTEOARTHROSIS, LOCAL, SCND, UNSPC SITE (ICD-715.20) PVD (ICD-443.9) HYPERTENSION (ICD-401.9) HIV DISEASE (ICD-042) BREAST CANCER, HX OF (ICD-V10.3) ALOpecia  Past Surgical History: Last updated: 02/03/2008 Lumpectomy Tubal ligation Lymph Node Dissection (2005) Port-a-cath Insertion (2005 Removed 2006)  Family History: Last updated: 09/23/2008 brother with early CAD with massive MI in 81s sister had i-131 rx of hyperthyroidism dm: mother and 2 sisters  Social History: Last updated: 02/03/2008 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 brother with early CAD with massive MI in 53s sister had i-131 rx of hyperthyroidism dm: mother and 2 sisters  Social History: Reviewed history from 02/03/2008 and no changes required. 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 No Pain Assessment Patient in pain? no      Nutritional Status BMI of > 30 = obese Nutritional Status Detail nl  Does patient need assistance? Functional Status Self care Ambulation Normal   Physical Exam  General:  alert.  well-developed and well-nourished.   Head:  normocephalic, atraumatic, has alopecia with scaling on scalp Eyes:  vision grossly intact, pupils equal, pupils round, and pupils reactive to light.   Ears:  no external deformities and ear piercing(s) noted.   Nose:  no external erythema anminimal exteranl drainge, slightly boggy mucosa Mouth:  pharynx pink and moist, no erythema, and no exudates.   Neck:  supple and full ROM.   Lungs:  normal respiratory effort, no dullness, no crackles, and no wheezes.   Heart:  normal rate, regular rhythm, no murmur, no gallop,  and no rub.   Abdomen:  soft, non-tender, normal bowel sounds, and no distention.   Msk:  No deformity or scoliosis noted of thoracic or lumbar spine.   Extremities:  trace left pedal edema and trace right pedal edema.   Neurologic:  alert & oriented X3, strength normal in all extremities, and gait normal.   Skin:  alopecia with hyperpigmented area on scalp Psych:  Oriented X3, memory intact for recent and remote, and normally interactive.          Medication Adherence: 05/29/2010   Adherence to medications reviewed with patient. Counseling to provide adequate adherence provided                                Impression & Recommendations:  Problem # 1:  HIV DISEASE (ICD-042)  Excellent control Diagnostics Reviewed:  HIV:  CDC-defined AIDS (05/15/2010)   CD4: 720 (05/16/2010)   WBC: 5.6 (05/15/2010)   Hgb: 15.3 (05/15/2010)   HCT: 45.1 (05/15/2010)   Platelets: 209 (05/15/2010) HIV-1 RNA: 25 (05/15/2010)   HBSAg: No (08/19/2006)  Orders: Est. Patient Level V (40981)  Problem # 2:  ALOPECIA AREATA (ICD-704.01) improving with help of Dermatology at [facility]  Problem # 3:  MENORRHAGIA, POSTMENOPAUSAL (ICD-627.1)  improved post two d and c's .Did have some minmal spotting last month  Orders: Est. Patient Level V (19147)  Problem # 4:  HYPOTHYROIDISM, POST-RADIATION (ICD-244.1)  followedby Dr.  Everardo All.  Her updated medication list for this problem includes:    Levothyroxine Sodium 175 Mcg Tabs (Levothyroxine sodium) .Marland Kitchen... 1 qd  Labs Reviewed: TSH: 0.093 (05/15/2010)   Free T4: 0.1 (06/28/2008)   Total T3: 271.2 (12/15/2007) HgBA1c: 5.9 (11/28/2009) Chol: 176 (05/15/2010)   HDL: 50 (05/15/2010)   LDL: 93 (05/15/2010)   TG: 163 (05/15/2010)  Orders: Est. Patient Level V (82956)  Problem # 5:  HYPERTENSION (ICD-401.9)  WIll see how she does on   HCTZ alone. She has lost weight. Will add  amlodipine if bp not controlled on one drug The following medications were removed from the medication list:    Benicar Hct 40-25 Mg Tabs (Olmesartan medoxomil-hctz) .Marland Kitchen... Take 1 tablet by mouth once a day Her updated medication list for this problem includes:    Hydrochlorothiazide 25 Mg Tabs (Hydrochlorothiazide) .Marland Kitchen... Take 1 tablet by mouth once a day  Orders: Est. Patient Level V (21308)  Problem # 6:  BREAST CANCER, HX OF (ICD-V10.3)  in remission.  Orders: Est. Patient Level V (65784)  Medications Added to Medication List This Visit: 1)  Hydrochlorothiazide 25 Mg Tabs (Hydrochlorothiazide) .... Take 1 tablet by mouth once a day  Other Orders: Future Orders: T-CD4SP ([facility]) (CD4SP) ... 11/25/2010 T-HIV Viral Load [id number] ... 11/25/2010 T-CBC w/Diff ([id number]) ... 11/25/2010 T-Comprehensive  Metabolic Panel [id number]) ... 11/25/2010

## 2010-07-27 NOTE — Progress Notes (Signed)
Summary: Pt. requesting B/P rx change to generic  Phone Note Call from Patient Call back at [phone]   Caller: Patient Call For: Paulette Blanch Dam MD Reason for Call: Talk to Doctor Summary of Call: Wants to switch to a different B/P medication.  The co-pay for the Benicar HCT is too costly.  Would prefer a generic if possible.  Please send new rx to the [facility] on [address] and let the pt. know about the change.  Please advise. Jennet Maduro RN  May 24, 2010 10:30 AM   Follow-up for Phone Call        I switched her to HCTZ alone Follow-up by: Acey Lav MD,  May 30, 2010 8:14 AM

## 2010-07-27 NOTE — Progress Notes (Signed)
  Phone Note Other Incoming   Request: Send information Summary of Call: Request for records received from [employer]. Request forwarded to [employer].

## 2010-08-16 NOTE — Miscellaneous (Signed)
Summary: [employer] [facility] [employer]   Imported By: Florinda Marker 08/07/2010 09:00:37  _____________________________________________________________________  External Attachment:    Type:   Image     Comment:   External Document

## 2010-08-17 ENCOUNTER — Telehealth: Payer: Self-pay | Admitting: Licensed Clinical Social Worker

## 2010-09-01 ENCOUNTER — Encounter (INDEPENDENT_AMBULATORY_CARE_PROVIDER_SITE_OTHER): Payer: Self-pay | Admitting: *Deleted

## 2010-09-04 ENCOUNTER — Encounter: Payer: Self-pay | Admitting: Licensed Clinical Social Worker

## 2010-09-05 ENCOUNTER — Telehealth: Payer: Self-pay | Admitting: Infectious Disease

## 2010-09-05 LAB — T-HELPER CELL (CD4) - (RCID CLINIC ONLY)
CD4 % Helper T Cell: 35 % (ref 33–55)
CD4 T Cell Abs: 720 uL (ref 400–2700)

## 2010-09-05 NOTE — Miscellaneous (Signed)
Summary: PRESCRIPTION ASSISTANCE  Clinical Lists Changes MS. Burleson CALLED ABOUT NEEDING NEW CO-PAY CARDS FOR ISENTRESS AND ATRIPLA.  I PRINTED A NEW ISENTRESS COUPON AND PUT IT WITH A CO-PAY CARD FOR ATRIPLA IN AN ENVELOPE FOR HER TO PICK UP.  AT FRONT OFFICE.

## 2010-09-09 LAB — T-HELPER CELL (CD4) - (RCID CLINIC ONLY)
CD4 % Helper T Cell: 34 % (ref 33–55)
CD4 T Cell Abs: 650 uL (ref 400–2700)

## 2010-09-10 LAB — BASIC METABOLIC PANEL
GFR calc Af Amer: >60 mL/min (ref >60)
GFR calc non Af Amer: 59 mL/min — ABNORMAL LOW (ref >60)
Glucose, Bld: 92 mg/dL (ref 70–99)
Potassium: 3.3 mEq/L — ABNORMAL LOW (ref 3.5–5.1)
Sodium: 137 mEq/L (ref 135–145)

## 2010-09-10 LAB — CBC
Hemoglobin: 14.5 g/dL (ref 12.0–15.0)
RDW: 13.7 % (ref 11.5–15.5)

## 2010-09-11 LAB — T-HELPER CELL (CD4) - (RCID CLINIC ONLY)
CD4 % Helper T Cell: 35 % (ref 33–55)
CD4 T Cell Abs: 790 uL (ref 400–2700)

## 2010-09-13 ENCOUNTER — Other Ambulatory Visit: Payer: PRIVATE HEALTH INSURANCE | Admitting: Infectious Disease

## 2010-09-13 DIAGNOSIS — B20 Human immunodeficiency virus [HIV] disease: Secondary | ICD-10-CM

## 2010-09-14 LAB — CBC WITH DIFFERENTIAL/PLATELET
Basophils Absolute: 0.1 10*3/uL (ref 0.0–0.1)
Lymphocytes Relative: 44 % (ref 12–46)
Lymphs Abs: 2.1 10*3/uL (ref 0.7–4.0)
MCV: 95.1 fL (ref 78.0–100.0)
Neutro Abs: 2 10*3/uL (ref 1.7–7.7)
Neutrophils Relative %: 40 % — ABNORMAL LOW (ref 43–77)
Platelets: 213 10*3/uL (ref 150–400)
RBC: 4.72 MIL/uL (ref 3.87–5.11)
WBC: 4.9 10*3/uL (ref 4.0–10.5)

## 2010-09-14 LAB — T-HELPER CELLS (CD4) COUNT (NOT AT ARMC)
Absolute CD4: 718 /uL (ref 381–1469)
CD4 T Helper %: 35 % (ref 32–62)
Total lymphocyte count: 2050 /uL (ref 700–3300)

## 2010-09-14 LAB — COMPLETE METABOLIC PANEL WITH GFR
AST: 35 U/L (ref 0–37)
Albumin: 4.2 g/dL (ref 3.5–5.2)
Alkaline Phosphatase: 108 U/L (ref 39–117)
Potassium: 4.2 mEq/L (ref 3.5–5.3)
Sodium: 142 mEq/L (ref 135–145)
Total Bilirubin: 0.4 mg/dL (ref 0.3–1.2)
Total Protein: 7.9 g/dL (ref 6.0–8.3)

## 2010-09-15 ENCOUNTER — Other Ambulatory Visit: Payer: Self-pay | Admitting: Endocrinology

## 2010-09-15 LAB — HIV-1 RNA QUANT-NO REFLEX-BLD
HIV 1 RNA Quant: <20 copies/mL (ref <20)
HIV-1 RNA Quant, Log: <1.3 {Log} (ref <1.30)

## 2010-09-25 ENCOUNTER — Other Ambulatory Visit: Payer: Self-pay | Admitting: Infectious Disease

## 2010-09-25 DIAGNOSIS — B20 Human immunodeficiency virus [HIV] disease: Secondary | ICD-10-CM

## 2010-09-26 NOTE — Progress Notes (Signed)
Summary: Did pt receive or refuse flu shot?  Phone Note Outgoing Call   Call placed by: Acey Lav MD,  September 05, 2010 10:28 AM Summary of Call: Kayla Price this pt was seen in December. Did she receive or refuse flu shot? Initial call taken by: Acey Lav MD,  September 05, 2010 10:28 AM

## 2010-09-26 NOTE — Progress Notes (Signed)
Summary: pATEINT NEEDS FLU SHOT--awaiting response  Phone Note Outgoing Call   Call placed by: Acey Lav MD,  August 17, 2010 4:02 PM Details for Reason: pATIENT NEEDS FLU SHOT Summary of Call: Patient needs flu shot. Please call and have come in for flu shot and if pt refuses flu shot  please document in chart Initial call taken by: Acey Lav MD,  August 18, 2010 8:09 PM  Follow-up for Phone Call        Called patient no answer or voicemail Follow-up by: Starleen Arms CMA,  August 18, 2010 9:47 AM

## 2010-09-27 ENCOUNTER — Ambulatory Visit (INDEPENDENT_AMBULATORY_CARE_PROVIDER_SITE_OTHER): Payer: PRIVATE HEALTH INSURANCE | Admitting: Infectious Disease

## 2010-09-27 ENCOUNTER — Encounter: Payer: Self-pay | Admitting: Infectious Disease

## 2010-09-27 VITALS — BP 124/80 | HR 76 | Temp 97.5°F | Wt 259.0 lb

## 2010-09-27 DIAGNOSIS — E782 Mixed hyperlipidemia: Secondary | ICD-10-CM

## 2010-09-27 DIAGNOSIS — I10 Essential (primary) hypertension: Secondary | ICD-10-CM

## 2010-09-27 DIAGNOSIS — E890 Postprocedural hypothyroidism: Secondary | ICD-10-CM

## 2010-09-27 DIAGNOSIS — Z853 Personal history of malignant neoplasm of breast: Secondary | ICD-10-CM

## 2010-09-27 DIAGNOSIS — Z21 Asymptomatic human immunodeficiency virus [HIV] infection status: Secondary | ICD-10-CM

## 2010-09-27 DIAGNOSIS — B20 Human immunodeficiency virus [HIV] disease: Secondary | ICD-10-CM

## 2010-09-27 MED ORDER — OLMESARTAN MEDOXOMIL-HCTZ 40-25 MG PO TABS: 1.0000 | ORAL_TABLET | Freq: Every day | ORAL | Status: DC

## 2010-09-27 MED ORDER — INFLUENZA VAC TYP A&B SURF ANT IM INJ: 0.5000 mL | INJECTION | Freq: Once | INTRAMUSCULAR | Status: DC

## 2010-09-27 NOTE — Assessment & Plan Note (Signed)
She has been cancer free and is followed closely by Dr. Marikay Alar Magr [facility]

## 2010-09-27 NOTE — Assessment & Plan Note (Signed)
This occurred after radiation for hyperthyroidism. She continues on levothyroxine. She is followed by Dr. Everardo All with a [facility].

## 2010-09-27 NOTE — Patient Instructions (Signed)
grEAT joB We will give flu shot today rtc in October for labs flu shot, pneumonia shot and visit

## 2010-09-27 NOTE — Assessment & Plan Note (Signed)
Blood pressure but has been much better controlled since she switched back to Benicar

## 2010-09-27 NOTE — Assessment & Plan Note (Signed)
I've counseled her to try a low carbohydrate diet in addition to the exercises she continues to engage in.

## 2010-09-27 NOTE — Assessment & Plan Note (Signed)
She has stopped talking taking her Lipitor. Her lipids appear to goal we'll recheck him again at next visit.

## 2010-09-27 NOTE — Progress Notes (Signed)
  Subjective:    Patient ID: Kayla Price, female    DOB: 12-15-1955, 55 y.o.   MRN: [id number]  HPI  55 year old African American female supreme a well controlled on a TRIPL a and ISENTRESS with undetectable viral load and healthy CD4 count. She does suffers from comorbid problems such as hypertension hypothyroidism and morbid obesity. She wishes to see Dr. Everardo All. again with regards to her hypothyroidism. Dr. Everardo All also been advising her with regards to diet. We discussed potentially starting a low carbohydrate residue diet such as paleolithic . The patient otherwise felt well and had no specific complaints today. Review of Systems As in history present illness otherwise 12 point review of systems is negative.    Objective:   Physical Exam Patient was alert and oriented x4. She is normocephalic atraumatic she is her pupils are equal round react to light her sclerae anicteric her affect clear neck is supple she had no exudates.  Exam revealed a regular rate and rhythm no murmurs or rubs lungs are black rotation bilaterally without wheezes or rales abdomen soft nondistended. Extremities no edema. She is morbidly obese. She did have some loss of hair on the her scalp. This appears stable. Her neurological exam is nonfocal. Her gait is normal. She had no other dermatological abnormalities       Assessment & Plan:  HIV DISEASE Superbly well controlled  HYPOTHYROIDISM, POST-RADIATION This occurred after radiation for hyperthyroidism. She continues on levothyroxine. She is followed by Dr. Everardo All with a LEBAU ER.  HYPERLIPIDEMIA, MIXED She has stopped talking taking her Lipitor. Her lipids appear to goal we'll recheck him again at next visit.  BREAST CANCER, HX OF She has been cancer free and is followed closely by Dr. Marikay Alar Magr INOT  HYPERTENSION Blood pressure but has been much better controlled since she switched back to Benicar   HIV DISEASE Superbly well  controlled  HYPOTHYROIDISM, POST-RADIATION This occurred after radiation for hyperthyroidism. She continues on levothyroxine. She is followed by Dr. Everardo All with a LEBAU ER.  HYPERLIPIDEMIA, MIXED She has stopped talking taking her Lipitor. Her lipids appear to goal we'll recheck him again at next visit.  BREAST CANCER, HX OF She has been cancer free and is followed closely by Dr. Marikay Alar Magr INOT  HYPERTENSION Blood pressure but has been much better controlled since she switched back to Benicar  Obesities, morbid I've counseled her to try a low carbohydrate diet in addition to the exercises she continues to engage in.

## 2010-09-27 NOTE — Assessment & Plan Note (Signed)
Superbly well controlled

## 2010-09-29 LAB — CBC
HCT: 44 % (ref 36.0–46.0)
Hemoglobin: 15.1 g/dL — ABNORMAL HIGH (ref 12.0–15.0)
MCV: 100.9 fL — ABNORMAL HIGH (ref 78.0–100.0)
Platelets: 185 10*3/uL (ref 150–400)
RDW: 13.4 % (ref 11.5–15.5)

## 2010-09-29 LAB — BASIC METABOLIC PANEL
BUN: 13 mg/dL (ref 6–23)
Chloride: 104 mEq/L (ref 96–112)
GFR calc Af Amer: >60 mL/min (ref >60)
GFR calc non Af Amer: 52 mL/min — ABNORMAL LOW (ref >60)
Potassium: 4 mEq/L (ref 3.5–5.1)
Sodium: 138 mEq/L (ref 135–145)

## 2010-09-30 LAB — CBC
HCT: 42.3 % (ref 36.0–46.0)
MCV: 101.3 fL — ABNORMAL HIGH (ref 78.0–100.0)
Platelets: 166 10*3/uL (ref 150–400)
RDW: 13.9 % (ref 11.5–15.5)

## 2010-09-30 LAB — BASIC METABOLIC PANEL
BUN: 13 mg/dL (ref 6–23)
Creatinine, Ser: 1.07 mg/dL (ref 0.4–1.2)
GFR calc non Af Amer: 54 mL/min — ABNORMAL LOW (ref >60)
Glucose, Bld: 98 mg/dL (ref 70–99)
Potassium: 4 mEq/L (ref 3.5–5.1)

## 2010-10-01 LAB — T-HELPER CELL (CD4) - (RCID CLINIC ONLY): CD4 T Cell Abs: 710 uL (ref 400–2700)

## 2010-10-05 LAB — T-HELPER CELL (CD4) - (RCID CLINIC ONLY): CD4 T Cell Abs: 530 uL (ref 400–2700)

## 2010-11-07 NOTE — Assessment & Plan Note (Signed)
NAME:  Kayla Price, Kayla Price NO.:  [id number]   MEDICAL RECORD NO.:  [id number]          PATIENT TYPE:  POB   LOCATION:  [facility] at [facility]         FACILITY:  [facility]   PHYSICIAN:  Allie Bossier, MD        DATE OF BIRTH:  1955-10-20   DATE OF SERVICE:  10/18/2009                                  CLINIC NOTE   Kayla Price is a 55 year old G4, P2, A2, was diagnosed with breast cancer  in 2005 her right breast and treated by Dr. Darnelle Catalan.  She also is HIV  positive.  She was evaluated last year for a postmenopausal bleeding and  a Pap smear that showed atypical glandular cells of undetermined  significance.  During the course of last year, she has had an  endometrial biopsy, a colposcopy, followed by a D and C, a cone biopsy  of her cervix.  The CKC and D and C were done on March 14, 2009, and  there was no atypia or hyperplasia or carcinoma noted on either of the  specimen.  She comes back today for her regularly scheduled followup Pap  smear (she is 1 month late), but she started having some bleeding on  October 10, 2009.  Now is not certain that she is truly menopausal because  her lack of periods was initially started when she was treated with her  chemotherapy by Dr. Darnelle Catalan in 2005.   On exam today, her vulva and vagina are very atrophic.  The cervix is  stenotic and essentially flushed with the vaginal cuff (status post  conization).  I did obtain a Pap smear and inspected the vaginal vault  thoroughly.  There were no abnormalities noted.   ASSESSMENT AND PLAN:  Bleeding in a 55 year old lady who is status post  chemotherapy for breast cancer and who is HIV positive and has history  of atypical glandular cells of uncertain significance.  I have checked  Pap smear today.  I will order a GYN ultrasound to look at her uterus.  I have also ordered an FSH to determine if this is truly postmenopausal  bleeding or not.  I will see her back as soon as her ultrasound is  finished.      Allie Bossier, MD     MCD/MEDQ  D:  10/18/2009  T:  10/19/2009  Job:  [id number]

## 2010-11-07 NOTE — Assessment & Plan Note (Signed)
NAME:  Kayla Price, Kayla Price NO.:  [id number]   MEDICAL RECORD NO.:  [id number]          PATIENT TYPE:  POB   LOCATION:  [facility] at [facility]         FACILITY:  [facility]   PHYSICIAN:  Allie Bossier, MD        DATE OF BIRTH:  Jul 19, 1955   DATE OF SERVICE:  12/06/2008                                  CLINIC NOTE   [name] is a 55 year old patient, who was referred here for a Pap smear  that on Nov 04, 2008, showed atypical glandular cells.  Dr. Penne Lash did  an endometrial biopsy and a colposcopy and endocervical curettage on Nov 11, 2008.  According to her colposcopy picture, she was not able to  visualize the entire transition zone.  The results of the endometrial  biopsy were normal as were the cervical biopsy and endocervical  curettage.  On further discussion with [name], she describes an  occasion several months ago of postmenopausal bleeding.  She has been  menopausal for approximately 5 years and only had 1 occasion of  postmenopausal bleeding, but she has not had an ultrasound to evaluate  this.   My plan is to get an ultrasound scheduled and then see her back.  It is  my thinking that due to her postmenopausal bleeding history and atypical  glandular cell finding on ultrasound that she may need a D and C,  hysteroscopy, and possibly a cone biopsy (since the entire transition  zone was not seen on colposcopy).  I will see her back for the  ultrasound results in 1-2 weeks.      Allie Bossier, MD     MCD/MEDQ  D:  12/06/2008  T:  12/07/2008  Job:  [id number]

## 2010-11-10 NOTE — Op Note (Signed)
NAMEKOURTNEE, LAHEY             ACCOUNT NO.:  [id number]   MEDICAL RECORD NO.:  [id number]          PATIENT TYPE:  AMB   LOCATION:  [facility]                          FACILITY:  [facility]   PHYSICIAN:  Rose Phi. Maple Hudson, M.D.   DATE OF BIRTH:  09/30/1955   DATE OF PROCEDURE:  01/12/2005  DATE OF DISCHARGE:                                 OPERATIVE REPORT   PREOPERATIVE DIAGNOSIS:  History of breast cancer.   POSTOPERATIVE DIAGNOSIS:  History of breast cancer.   OPERATION PERFORMED:  Removal of Port-A-Cath.   SURGEON:  Rose Phi. Maple Hudson, M.D.   ANESTHESIA:  Local.   DESCRIPTION OF OPERATION:  The patient was placed on the operating table and  the left upper chest was prepped and draped in the usual fashion.  The area  where the Port-A-Cath was, was then infiltrated with 1% Xylocaine with  adrenaline.  Te old incision was then incised and the catheter exposed,  grasped and removed from the subclavian vein.  There was no bleeding.  With  traction on the catheter we exposed the port and divided the two sutures  holding it in place, and then the port just slid out.  Again, with no  bleeding the skin was closed with subcuticular  4-0 Monocryl and Steri-  strips.  Dressing was applied.   The patient was then allowed to go home.       PRY/MEDQ  D:  01/12/2005  T:  01/12/2005  Job:  [id number]

## 2010-11-10 NOTE — Op Note (Signed)
Kayla [name], DIEP             ACCOUNT NO.:  [id number]   MEDICAL RECORD NO.:  [id number]          PATIENT TYPE:  AMB   LOCATION:  [facility]                          FACILITY:  [facility]   PHYSICIAN:  Rose Phi. Maple Hudson, M.D.   DATE OF BIRTH:  [birth year]   DATE OF PROCEDURE:  04/14/2004  DATE OF DISCHARGE:                                 OPERATIVE REPORT   PREOPERATIVE DIAGNOSIS:  Stage I carcinoma of the right breast.   POSTOPERATIVE DIAGNOSIS:  Stage II carcinoma of the right breast.   OPERATION:  1.  Blue dye injection.  2.  Right axillary sentinel lymph node biopsy.  3.  Right partial mastectomy.   SURGEON:  Rose Phi. Maple Hudson, M.D.   ANESTHESIA:  General.   OPERATIVE PROCEDURE:  This 55 year old female had presented with a palpable  mass at about the 12:30 position of her right breast which was positive on a  core biopsy and was ERPR negative.  She was then scheduled for breast  conservation surgery to be followed by radiation therapy and evaluation per  chemotherapy.   After suitable general anesthesia was induced, the patient was placed in a  supine position with the arms extended on the arm board.  Prior to coming to  the operating room, 1 millicurie of technetium sulfur colloid was injected  intradermally and, after she was asleep, I injected 5 mL of a mixture of 2  mL of methylene blue and 3 mL of injectable saline in the subareolar tissue  and then massaged this gently for about three minutes.  We then prepped and  draped the breast and axilla.  Careful scanning with the Neoprobe of the  axilla, the supraclavicular, and the internal mammary areas revealed only a  hot spot in the axilla.  A short transverse axillary incision was made with  dissection through the subcutaneous tissue to the clavipectoral fascia.  Just deep to the fascia was a blue and hot lymph node which we removed.  Adjacent to it was a hot but not blue lymph node which we also removed as  sentinel nodes.  These  were submitted to the pathologist for evaluation.   While that was being done, a curved incision over the palpable tumor in the  upper inner quadrant was outlined including an ellipse of skin over the  tumor.  The incision was made and a wide excision was carried out.  After  removing the specimen, I felt like we were awfully close on the inferior  margin, so I excised more of the inferior margin which will be submitted for  permanent sections.  The sentinel node was reported as negative for  metastatic disease by the pathologist and the margins were all clean on the  lumpectomy, but there was concern about the inferior margin which I think we  resolved by excising more of this.   With good hemostasis, both incisions were injected with 0.25% Marcaine.  They were then closed in two layers with 3-0 Vicryl and subcuticular 4-0  Monocryl and Steri-Strips.  Dressings were then applied and the patient  transferred to the recovery  room in satisfactory condition, having tolerated  the procedure well.      Pete   PRY/MEDQ  D:  04/14/2004  T:  04/14/2004  Job:  [id number]   cc:   Ward Roxan Hockey, M.D.

## 2010-11-10 NOTE — Group Therapy Note (Signed)
NAME:  Kayla [name], ANSELMO NO.:  [id number]   MEDICAL RECORD NO.:  [id number]                   PATIENT TYPE:  OUT   LOCATION[facility]  [facility]                           FACILITY:  [facility]   PHYSICIAN:  Tinnie Gens, MD                     DATE OF BIRTH:  24-Feb-1956   DATE OF SERVICE:  03/18/2003                                    CLINIC NOTE   CHIEF COMPLAINT:  Abnormal vaginal bleeding.   HISTORY OF PRESENT ILLNESS:  The patient is a 55 year old G2 P2 who comes in  today with abnormal bleeding.  Apparently over the past year her periods  have been getting heavier and longer.  She is also having sweats and hot  flashes.  She states that her periods were previously regular but now are  coming every two to three weeks and lasting between seven and nine days.  She is also passing clots and having increasing cramping.   PAST MEDICAL HISTORY:  Significant for HIV disease and hypertension,  arthritis.   PAST SURGICAL HISTORY:  She had tubal ligation in 1991, arthroscopic knee  surgery in 2002.   MEDICATIONS:  Lisinopril, Kaletra, Epivir, Viread, and Sustiva.   ALLERGIES:  No known allergies.   GYNECOLOGICAL HISTORY:  Menarche is at age 48.  Periods every 15-20 days,  last seven to nine days.  LMP is February 24, 2003.  Status post tubal  ligation.  No history of abnormal Paps.  Last mammogram in 2002 which was  normal and Pap smear last year also normal.   OBSTETRICAL HISTORY:  She is a G2, P2; two vaginal deliveries.   FAMILY HISTORY:  Significant for diabetes, heart disease, and hypertension.   SOCIAL HISTORY:  She works for the [employer].  She does not smoke or do  any other drugs or alcohol.   REVIEW OF SYSTEMS:  Significant for muscle aches, night sweats, fatigue, hot  flashes, and vaginal bleeding as stated in the HPI.  She denies chest pain  and shortness of breath, problems with passing her urine, or with  constipation.   PHYSICAL  EXAMINATION:  VITAL SIGNS:  Blood pressure is 108/71, weight is  212.9, height is 5 feet 7.5 inches.  Pulse 87.  GENERAL:  She is a well-developed, well-nourished black female in no acute  distress.  LUNGS:  Clear bilaterally.  CARDIOVASCULAR:  Regular rate and rhythm without murmurs or gallops.  ABDOMEN:  Soft, nontender, nondistended.  GENITOURINARY:  She has normal external female genitalia.  The cervix is  visualized and without lesion.   PROCEDURE:  After cleaning with Betadine a Pipelle was used to sound the  uterus which sounds to 6 cm.  Endometrial biopsy was taken without  difficulty.  On bimanual, which is limited secondary to body habitus, the  uterus feels normal and the adnexa are also within normal limits.   IMPRESSION:  Abnormal vaginal  bleeding, question of etiology.   PLAN:  Check TSH, FSH, and LH to rule out hypothyroid as well as  perimenopause.  Check endometrial biopsy results.  Will follow her up in  approximately two weeks for results of these tests.                                                Tinnie Gens, MD    TP/MEDQ  D:  03/18/2003  T:  03/18/2003  Job:  [id number]

## 2010-11-10 NOTE — Op Note (Signed)
[place]  Patient:    Kayla [name], Kayla [name]                    MRN: [id number] Proc. Date: 07/08/00 Adm. Date:  [id number] Disc. Date: [id number] Attending:  Sandi Raveling                           Operative Report  PREOPERATIVE DIAGNOSIS:  Left knee medial meniscus tear with degenerative joint disease.  POSTOPERATIVE DIAGNOSIS:  Left knee medial and lateral meniscal tears with tricompartmental degenerative joint disease.  PROCEDURE: 1. Left knee EUA followed by arthroscopic partial medial and lateral    meniscectomies. 2. Left knee tricompartmental chondroplasty and debridement.  SURGEON:  Elana Alm. Thurston Hole, M.D.  ASSISTANT:  Kirstin A. Shepperson, P.A.  ANESTHESIA:  Local and MAC.  OPERATIVE TIME:  30 minutes.  COMPLICATIONS:  None.  INDICATIONS FOR PROCEDURE:  Kayla [name] is a 55 year old woman, HIV positive, with hypertension and COPD under the care of Dr. Burnice Logan and treated well for her HIV-positive status, functioning in a normal way for the [employer] service, who injured her left knee approximately three months ago with a fall, hyperflexing the knee.  Since that time has had significant pain in the knee and has failed conservative care and is now to undergo arthroscopy.  DESCRIPTION:  Kayla [name] was brought to the operating room on July 08, 2000 after a block had been placed in the holding room.  Placed on the operative table in a supine position.  Her left knee was examined under anesthesia. Range of motion from 0-125 degrees, 1-2+ crepitation, knee stable ligamentous exam with normal patellar tracking.  The left leg was prepped using sterile Betadine and draped using sterile technique.  She received Ancef 1 g IV preoperatively for prophylaxis per Dr. Elesa Massed Robinsons recommendation. Initially, through an inferolateral portal the arthroscope with a pump attached was placed, and through an inferomedial portal an arthroscopic  probe was placed.  On initial inspection of the medial compartment, she was found to have 50-60% grade 4 changes - medial femoral condyle and medial tibial plateau, and the rest grade 3 changes, which was thoroughly debrided.  Medial meniscus showed tearing of the posterior medial horn, of which 30-40% was resected back to a stable but degenerative rim.  Intercondylar notch inspected, the anterior cruciate ligament was deficient, there was scarring in it, and there were loose bodies barely attached to the fibrous tissue over the ACL stumps which were removed - two of these 1 x 1 cm.  She had 4-5 mm of anterior laxity but did appear to have an endpoint.  Posterior cruciate was intact and stable.  Lateral compartment inspected.  She had 75% grade 4 changes in the lateral compartment and the rest grade 3 changes, which was debrided.  She had complex tearing of the posterior and lateral horn and lateral meniscus, of which 50-60% was resected back to a stable rim.  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  This was debrided.  The patella tracked normally.  Spurs noted on both the patella and the femoral condyles were not removed - they were not impinging on motion.  There was significant synovitis in the medial and lateral gutters which was thoroughly debrided and then cauterized.  The medial and lateral gutters otherwise were free of pathology.  After this was done, it was  felt that all pathology had been satisfactorily addressed.  The instruments were removed.  Portals closed with 3-0 nylon suture and injected with 0.25% Marcaine with epinephrine and 5 mg of morphine.  Sterile dressings applied and the patient awakened and taken to the recovery room in stable condition.  FOLLOW-UP CARE:  Kayla [name] will be followed as an outpatient on Vicodin and Celebrex.  See her back in the office in a week for sutures out and follow-up. DD:   07/08/00 TD:  07/08/00 Job: [id number]

## 2010-11-10 NOTE — Op Note (Signed)
[name], [name]             ACCOUNT NO.:  [id number]   MEDICAL RECORD NO.:  [id number]          PATIENT TYPE:  AMB   LOCATION:  [facility]                          FACILITY:  [facility]   PHYSICIAN:  Rose Phi. Maple Hudson, M.D.   DATE OF BIRTH:  07-Jun-1956   DATE OF PROCEDURE:  05/08/2004  DATE OF DISCHARGE:                                 OPERATIVE REPORT   PREOPERATIVE DIAGNOSIS:  Carcinoma of the right breast.   POSTOPERATIVE DIAGNOSIS:  Carcinoma of the right breast.   OPERATION:  Insertion of port-A-Cath.   SURGEON:  Rose Phi. Maple Hudson, M.D.   ANESTHESIA:  MAC.   OPERATIVE PROCEDURE:  The patient was placed on the operating table, with a  roll between her shoulders and the arms by the side, and the left upper  chest and neck prepped and draped in the usual fashion.  Under local  anesthesia, a left subclavian puncture was carried out without difficulty,  and a guide wire inserted, and proper position of the wire confirmed by  fluoroscopy.   We then made an incision on the anterior chest wall and developed a pocket  for the implantable port.  I tunneled between the subclavian puncture site  and the newly developed pocket and passed the catheter through that and  connected it to the port.  The port was placed in the pocket, and then we  trimmed the catheter tip to go to the fourth interspace at the cavoatrial  junction.   Having trimmed the catheter, we then passed the dilator and peel-away sheath  over the wire and removed the wire, followed by the dilator, and passed the  catheter through the peel-away sheath and then removed it.   Again, fluoroscopy confirmed that there was no kinking in the system and  that the catheter tip was at the cavoatrial junction in the superior vena  cava.   Incisions were closed with 3-0 Vicryl and subcuticular 4-0 Monocryl and then  Steri-Strips.  I then accessed it and thoroughly flushed it and easily  aspirated it and then fully heparinized it and then  did not leave it  accessed.   Dressings were then applied and the patient transferred to the recovery room  in satisfactory condition, having tolerated the procedure well.      Pete   PRY/MEDQ  D:  05/08/2004  T:  05/08/2004  Job:  [id number]

## 2010-11-10 NOTE — Group Therapy Note (Signed)
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  This was diagnosed in September, 2005, followed by chemotherapy and  radiation, which concluded in July, 2006.  The patient [facility]  [facility], at which time she reports that her MRI of  the breast, CT scan of the chest and mammography were all negative.  She  is scheduled for a colonoscopy in December.  She reports no hot flashes,  or vaginal irritation, or dryness.  She is here today for a routine  gynecological visit.  Abdomen is soft, without hepatosplenomegaly.  Pelvic exam:  External genitalia, vulva and vagina are normal.  Cervix  is smooth, without erosions or lesions.  PAP smear performed.  Uterus  small, anteverted and flexible, with adnexa negative.   IMPRESSION:  Normal gynecological exam, status post right breast  carcinoma.   PLAN:  The patient will continue with her care at the regional cancer  center and return in one year for a follow-up PAP smear.           ______________________________  Ginger Carne, MD     SHB/MEDQ  D:  05/23/2006  T:  05/23/2006  Job:  [id number]

## 2010-11-10 NOTE — Group Therapy Note (Signed)
NAME:  Kayla [name], Kayla [name] NO.:  [id number]   MEDICAL RECORD NO.:  [id number]          PATIENT TYPE:  WOC   [facility]                   [facility]ACILITY:  [facility]   PHYSICIAN:  Ellis Parents, MD    DATE OF BIRTH:  [birth year]   DATE OF SERVICE:                                    CLINIC NOTE   HISTORY OF PRESENT ILLNESS:  This 55 year old postmenopausal female comes in  for a routine Pap smear.  The patient is status post right lumpectomy and  right node biopsy of the right axillary sentinel lymph node which showed  metastatic carcinoma consistent with micrometastasis.  The right breast  excision of a mass was invasive ductal carcinoma and in situ ductal  carcinoma.  The patient received radiation therapy followed by chemotherapy  from November 2005 to April 2006.  The lumpectomy was performed in October  2005.  The last mammogram the patient had was October 2006.  She has not had  any menstrual periods or bleeding since October 2005.   PHYSICAL EXAMINATION:  GENITOURINARY:  The vagina is clean.  The cervix  looks well epithelialized.  The uterus is anterior, and normal in size, and  both adnexa are soft.  Pap smear is taken.   PLAN:  The patient is to return in one year.           ______________________________  Ellis Parents, MD     SA/MEDQ  D:  05/10/2005  T:  05/10/2005  Job:  [id number]

## 2011-01-08 ENCOUNTER — Ambulatory Visit (INDEPENDENT_AMBULATORY_CARE_PROVIDER_SITE_OTHER): Payer: PRIVATE HEALTH INSURANCE | Admitting: Endocrinology

## 2011-01-08 ENCOUNTER — Other Ambulatory Visit (INDEPENDENT_AMBULATORY_CARE_PROVIDER_SITE_OTHER): Payer: PRIVATE HEALTH INSURANCE

## 2011-01-08 ENCOUNTER — Encounter: Payer: Self-pay | Admitting: Endocrinology

## 2011-01-08 DIAGNOSIS — R7309 Other abnormal glucose: Secondary | ICD-10-CM

## 2011-01-08 DIAGNOSIS — E890 Postprocedural hypothyroidism: Secondary | ICD-10-CM

## 2011-01-08 LAB — TSH: TSH: 0.18 u[IU]/mL — ABNORMAL LOW (ref 0.35–5.50)

## 2011-01-08 LAB — HEMOGLOBIN A1C: Hgb A1c MFr Bld: 5.7 % (ref 4.6–6.5)

## 2011-01-08 MED ORDER — LEVOTHYROXINE SODIUM 150 MCG PO TABS: 150.0000 ug | ORAL_TABLET | Freq: Every day | ORAL | Status: DC

## 2011-01-08 NOTE — Patient Instructions (Addendum)
blood tests are being ordered for you today.  please call [phone] to hear your test results.  You will be prompted to enter the 9-digit "MRN" number that appears at the top left of this page, followed by #.  Then you will hear the message. Please see your eye doctor about the tearing symptoms. i have requested for you to go to an informational meeting bout weight-loss surgery.  you will be called with a day and time for an appointment. (update: i left message on phone-tree:  Reduce synthroid to 150/d).

## 2011-01-08 NOTE — Progress Notes (Signed)
Subjective:    Patient ID: Kayla Price, female    DOB: [birth year], 55 y.o.   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  she feels well on synthroid 175 micrograms/day, except weight gain.  She also has h/o hyperglycemia.  She says her diet is good.   Symptomatically, she reports 3 weeks of moderate tearing for the right eye only.  No assoc itching.  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   Past Medical History  Diagnosis Date  . HIV DISEASE 03/27/2006  . 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 Unspecified vitamin D deficiency 08/06/2007  . HYPERLIPIDEMIA, MIXED 12/15/2007  . HYPERTENSION 03/27/2006  . PVD 04/07/2007  . Alopecia areata 11/28/2009  . MENORRHAGIA, POSTMENOPAUSAL 02/03/2009  . TRIGGER FINGER 05/06/2008  . HIP FRACTURE, RIGHT 05/06/2008  . BREAST CANCER, HX OF 03/27/2006  . OSTEOARTHROSIS, LOCAL, SCND, UNSPC SITE 04/07/2007  . Fasting hyperglycemia   . Tinea capitis     Past Surgical History  Procedure Date  . Tubal ligation   . Breast lumpectomy   . Lymph node dissection 2005  . Port-a-cath removal 2006    insertion 2005    History   Social History  . Marital Status: Widowed    Spouse Name: N/A    Number of Children: N/A  . Years of Education: N/A   Occupational History  . Paramedic    Social History Main Topics  . Smoking status: Never Smoker   . Smokeless tobacco: Not on file  . Alcohol Use: Not on file  . Drug Use: No  . Sexually Active: Not on file   Other Topics Concern  . Not on file   Social History Narrative   single    Current Outpatient Prescriptions on File Prior to Visit  Medication Sig Dispense Refill  . ATRIPLA 600-200-300 MG per tablet TAKE 1 TABLET BY MOUTH ONCE DAILY  30 tablet  10  . clobetasol (TEMOVATE) 0.05 % cream Apply 1 application topically 2 (two) times daily.        Marland Kitchen levothyroxine (SYNTHROID, LEVOTHROID) 175  MCG tablet TAKE 1 TABLET BY MOUTH ONCE DAILY  30 tablet  6  . loratadine (CLARITIN) 10 MG tablet Take 10 mg by mouth daily. During allergy season        . Multiple Vitamins-Minerals (WOMENS MULTI VITAMIN & MINERAL PO) Take 1 tablet by mouth daily.        . naproxen sodium (ANAPROX) 220 MG tablet Take 220 mg by mouth as needed.        Marland Kitchen olmesartan-hydrochlorothiazide (BENICAR HCT) 40-25 MG per tablet Take 1 tablet by mouth daily.  30 tablet  11  . raltegravir (ISENTRESS) 400 MG tablet Take 400 mg by mouth 2 (two) times daily.        Marland Kitchen amLODipine (NORVASC) 10 MG tablet Take 10 mg by mouth daily.        Marland Kitchen ammonium lactate (LAC-HYDRIN) 12 % lotion Apply 1 application topically 2 (two) times daily.        Marland Kitchen atorvastatin (LIPITOR) 10 MG tablet Take 10 mg by mouth daily.        Marland Kitchen ketoconazole (NIZORAL) 2 % shampoo Apply twice weekly for 4 weeks with 3 days between each application       . traMADol (ULTRAM) 50 MG tablet 50 mg. 1 tab every 4-6 hours as needed  for pain        Current Facility-Administered Medications on File Prior to Visit  Medication Dose Route Frequency Provider Last Rate Last Dose  . influenza (>/= 3 years) inactive virus vaccine (FLUZONE/FLUVIRIN) injection 0.5 mL  0.5 mL Intramuscular Once Acey Lav, MD        Allergies  Allergen Reactions  . Lisinopril     Family History  Problem Relation Age of Onset  . Heart attack Brother     Massive MI in 69s  . Heart disease Brother     CAD  . Diabetes Mother   . Diabetes Sister   . Thyroid disease Sister     had I-131 rx of hyperthyroidism  . Diabetes Sister    BP 118/82  Pulse 83  Temp(Src) 98.4 F (36.9 C) (Oral)  Ht 5' 7.5" (1.715 m)  Wt 264 lb 3.2 oz (119.84 kg)  BMI 40.77 kg/m2  SpO2 98% Review of Systems Denies depression and visual loss.      Objective:   Physical Exam GENERAL: no distress.  Obese. Eyes:  There is slight bilat proptosis. Right eye: normal to my exam:  No drainage/erythema/swelling.        Tsh=0.18 Assessment & Plan:  Post-i-131 hypothyroidism, overreplaced. Mild grave's eye dz. There is no safe and effective rx for this.   Right eye sxs, possibly due to grave's dz.

## 2011-03-16 LAB — T-HELPER CELL (CD4) - (RCID CLINIC ONLY): CD4 % Helper T Cell: 25 — ABNORMAL LOW

## 2011-03-20 ENCOUNTER — Other Ambulatory Visit: Payer: Self-pay | Admitting: Oncology

## 2011-03-20 DIAGNOSIS — Z9889 Other specified postprocedural states: Secondary | ICD-10-CM

## 2011-03-20 DIAGNOSIS — Z853 Personal history of malignant neoplasm of breast: Secondary | ICD-10-CM

## 2011-03-26 ENCOUNTER — Other Ambulatory Visit (INDEPENDENT_AMBULATORY_CARE_PROVIDER_SITE_OTHER): Payer: PRIVATE HEALTH INSURANCE

## 2011-03-26 ENCOUNTER — Other Ambulatory Visit: Payer: PRIVATE HEALTH INSURANCE

## 2011-03-26 DIAGNOSIS — Z79899 Other long term (current) drug therapy: Secondary | ICD-10-CM

## 2011-03-26 DIAGNOSIS — Z113 Encounter for screening for infections with a predominantly sexual mode of transmission: Secondary | ICD-10-CM

## 2011-03-26 DIAGNOSIS — B20 Human immunodeficiency virus [HIV] disease: Secondary | ICD-10-CM

## 2011-03-26 LAB — COMPLETE METABOLIC PANEL WITH GFR
Albumin: 4.3 g/dL (ref 3.5–5.2)
BUN: 20 mg/dL (ref 6–23)
CO2: 28 mEq/L (ref 19–32)
Calcium: 10.5 mg/dL (ref 8.4–10.5)
Chloride: 100 mEq/L (ref 96–112)
GFR, Est African American: 58 mL/min — ABNORMAL LOW (ref >60)
GFR, Est Non African American: 48 mL/min — ABNORMAL LOW (ref >60)
Glucose, Bld: 94 mg/dL (ref 70–99)
Potassium: 4.4 mEq/L (ref 3.5–5.3)

## 2011-03-26 LAB — T-HELPER CELL (CD4) - (RCID CLINIC ONLY): CD4 T Cell Abs: 790

## 2011-03-26 LAB — DIFFERENTIAL
Lymphocytes Relative: 41 % (ref 12–46)
Monocytes Absolute: 0.4 10*3/uL (ref 0.1–1.0)
Monocytes Relative: 8 % (ref 3–12)
Neutro Abs: 2.2 10*3/uL (ref 1.7–7.7)

## 2011-03-26 LAB — CBC
HCT: 43.9 % (ref 36.0–46.0)
Hemoglobin: 15.4 g/dL — ABNORMAL HIGH (ref 12.0–15.0)
MCV: 96.5 fL (ref 78.0–100.0)
WBC: 4.5 10*3/uL (ref 4.0–10.5)

## 2011-03-26 LAB — LIPID PANEL
Cholesterol: 196 mg/dL (ref 0–200)
VLDL: 27 mg/dL (ref 0–40)

## 2011-03-26 LAB — CREATININE, SERUM
Creatinine, Ser: 0.83
GFR calc non Af Amer: >60

## 2011-03-27 LAB — T-HELPER CELL (CD4) - (RCID CLINIC ONLY): CD4 T Cell Abs: 640 uL (ref 400–2700)

## 2011-03-28 LAB — HIV-1 RNA QUANT-NO REFLEX-BLD: HIV-1 RNA Quant, Log: <1.3 {Log} (ref <1.30)

## 2011-04-05 LAB — T-HELPER CELL (CD4) - (RCID CLINIC ONLY)
CD4 % Helper T Cell: 25 — ABNORMAL LOW
CD4 T Cell Abs: 710

## 2011-04-09 ENCOUNTER — Encounter: Payer: Self-pay | Admitting: Infectious Disease

## 2011-04-09 ENCOUNTER — Ambulatory Visit (INDEPENDENT_AMBULATORY_CARE_PROVIDER_SITE_OTHER): Payer: PRIVATE HEALTH INSURANCE | Admitting: Infectious Disease

## 2011-04-09 VITALS — BP 126/85 | HR 82 | Temp 97.4°F | Wt 258.0 lb

## 2011-04-09 DIAGNOSIS — B20 Human immunodeficiency virus [HIV] disease: Secondary | ICD-10-CM

## 2011-04-09 DIAGNOSIS — E890 Postprocedural hypothyroidism: Secondary | ICD-10-CM

## 2011-04-09 DIAGNOSIS — G4700 Insomnia, unspecified: Secondary | ICD-10-CM | POA: Insufficient documentation

## 2011-04-09 DIAGNOSIS — E559 Vitamin D deficiency, unspecified: Secondary | ICD-10-CM

## 2011-04-09 DIAGNOSIS — Z23 Encounter for immunization: Secondary | ICD-10-CM

## 2011-04-09 DIAGNOSIS — M1993 Secondary osteoarthritis, unspecified site: Secondary | ICD-10-CM

## 2011-04-09 DIAGNOSIS — G589 Mononeuropathy, unspecified: Secondary | ICD-10-CM

## 2011-04-09 DIAGNOSIS — G629 Polyneuropathy, unspecified: Secondary | ICD-10-CM | POA: Insufficient documentation

## 2011-04-09 DIAGNOSIS — Z853 Personal history of malignant neoplasm of breast: Secondary | ICD-10-CM

## 2011-04-09 DIAGNOSIS — I10 Essential (primary) hypertension: Secondary | ICD-10-CM

## 2011-04-09 DIAGNOSIS — R87619 Unspecified abnormal cytological findings in specimens from cervix uteri: Secondary | ICD-10-CM

## 2011-04-09 DIAGNOSIS — L659 Nonscarring hair loss, unspecified: Secondary | ICD-10-CM

## 2011-04-09 LAB — VITAMIN B12: Vitamin B-12: >2000 pg/mL — ABNORMAL HIGH (ref 211–911)

## 2011-04-09 MED ORDER — TRAZODONE HCL 50 MG PO TABS: 50.0000 mg | ORAL_TABLET | Freq: Every day | ORAL | Status: DC

## 2011-04-09 MED ORDER — OLMESARTAN MEDOXOMIL-HCTZ 40-25 MG PO TABS: 1.0000 | ORAL_TABLET | Freq: Every day | ORAL | Status: DC

## 2011-04-09 NOTE — Assessment & Plan Note (Signed)
Continue Atripla and isentress

## 2011-04-09 NOTE — Assessment & Plan Note (Signed)
Consider gabapentin or other similar agent trial again in future

## 2011-04-09 NOTE — Progress Notes (Signed)
Subjective:    Patient ID: Kayla Price, female    DOB: 1956-04-29, 55 y.o.   MRN: [id number]  HPI  55 year old African American female with HIV perfectly controlled on Atripla and ice and rest also with post thyroid ablation hypothyroidism being managed by Dr. Everardo All. She returns to clinic for followup. She's continued to suffer from some arthritic pains in her left knee and plans to get plugged back into an orthopedic surgeon for consideration of joint replacement or knee. She also is concerned about possible vitamin deficiencies and has been taking some vitamin supplements as well as Correll oil based on the fact that her sister has had some vitamin deficiencies and has been taking herbal supplements. I've asked her to stop the Correll oil and agreed to test her for vitamin D B12 and folate levels today in mind these levels may be affected by her current vitamin supplementation. We spent greater than 45 minutes with this pt including greater than 50% of time in face to face counselling of the pt and in coordination of care.   Review of Systems  Constitutional: Negative for fever, chills, diaphoresis, activity change, appetite change, fatigue and unexpected weight change.  HENT: Negative for congestion, sore throat, rhinorrhea, sneezing, trouble swallowing and sinus pressure.   Eyes: Negative for photophobia and visual disturbance.  Respiratory: Negative for cough, chest tightness, shortness of breath, wheezing and stridor.   Cardiovascular: Negative for chest pain, palpitations and leg swelling.  Gastrointestinal: Negative for nausea, vomiting, abdominal pain, diarrhea, constipation, blood in stool, abdominal distention and anal bleeding.  Genitourinary: Negative for dysuria, hematuria, flank pain and difficulty urinating.  Musculoskeletal: Negative for myalgias, back pain, joint swelling, arthralgias and gait problem.  Skin: Negative for color change, pallor, rash and wound.    Neurological: Negative for dizziness, tremors, weakness and light-headedness.  Hematological: Negative for adenopathy. Does not bruise/bleed easily.  Psychiatric/Behavioral: Negative for behavioral problems, confusion, sleep disturbance, dysphoric mood, decreased concentration and agitation.       Objective:   Physical Exam  Constitutional: She is oriented to person, place, and time. She appears well-developed and well-nourished. No distress.  HENT:  Head: Normocephalic and atraumatic.  Mouth/Throat: Oropharynx is clear and moist. No oropharyngeal exudate.  Eyes: Conjunctivae and EOM are normal. Pupils are equal, round, and reactive to light. No scleral icterus.  Neck: Normal range of motion. Neck supple. No JVD present.  Cardiovascular: Normal rate, regular rhythm and normal heart sounds.  Exam reveals no gallop and no friction rub.   No murmur heard. Pulmonary/Chest: Effort normal and breath sounds normal. No respiratory distress. She has no wheezes. She has no rales. She exhibits no tenderness.  Abdominal: She exhibits no distension and no mass. There is no tenderness. There is no rebound and no guarding.  Musculoskeletal: She exhibits no edema and no tenderness.  Lymphadenopathy:    She has no cervical adenopathy.  Neurological: She is alert and oriented to person, place, and time. She has normal reflexes. She exhibits normal muscle tone. Coordination normal.  Skin: Skin is warm and dry. She is not diaphoretic. No erythema. No pallor.  Psychiatric: She has a normal mood and affect. Her behavior is normal. Judgment and thought content normal.          Assessment & Plan:  HIV DISEASE Continue Atripla and isentress   ABNORMAL GLANDULAR PAPANICOLAOU SMEAR OF CERVIX Need to make sure she is folloed closely for this  BREAST CANCER, HX OF Sp successful treatment. She suffers  from neuropathy possibly due to chemorx  UNSPECIFIED VITAMIN D DEFICIENCY Will recheck her vitamin d  levels  OSTEOARTHROSIS, LOCAL, SCND, UNSPC SITE Considering joint replacment  Insomnia Try trazadone  HYPOTHYROIDISM, POST-RADIATION On synthroid, followed by Dr Everardo All  Neuropathy Consider gabapentin or other similar agent trial again in future

## 2011-04-09 NOTE — Assessment & Plan Note (Signed)
Sp successful treatment. She suffers from neuropathy possibly due to chemorx

## 2011-04-09 NOTE — Assessment & Plan Note (Signed)
Try trazadone

## 2011-04-09 NOTE — Assessment & Plan Note (Signed)
Need to make sure she is folloed closely for this

## 2011-04-09 NOTE — Assessment & Plan Note (Signed)
Will recheck her vitamin d levels

## 2011-04-09 NOTE — Assessment & Plan Note (Signed)
On synthroid, followed by Dr Everardo All

## 2011-04-09 NOTE — Assessment & Plan Note (Signed)
Considering joint replacment

## 2011-04-10 DIAGNOSIS — B20 Human immunodeficiency virus [HIV] disease: Secondary | ICD-10-CM

## 2011-04-10 DIAGNOSIS — Z23 Encounter for immunization: Secondary | ICD-10-CM

## 2011-04-13 ENCOUNTER — Telehealth: Payer: Self-pay | Admitting: *Deleted

## 2011-04-13 LAB — VITAMIN D 1,25 DIHYDROXY
Vitamin D 1, 25 (OH)2 Total: 73 pg/mL — ABNORMAL HIGH (ref 18–72)
Vitamin D3 1, 25 (OH)2: 73 pg/mL

## 2011-04-13 NOTE — Telephone Encounter (Signed)
She wanted to know the results of her labs done at last visit. I went over them with her. She has stopped taking the B12 on md advice. I told her those high levels will drop now that she is not taking a supplement. Describes her diet as high in vegetables. Discussed her lipids. Advised no more biscuits or other prepared food high in solid fats. She is going to change from butter to a heart healthy spred. She named something she has seen in stores. She will call back next week to hear about her Vit D levels

## 2011-04-27 ENCOUNTER — Ambulatory Visit
Admission: RE | Admit: 2011-04-27 | Discharge: 2011-04-27 | Disposition: A | Discharge status: Home / Self Care | Payer: PRIVATE HEALTH INSURANCE | Source: Ambulatory Visit | Attending: Oncology | Admitting: Oncology

## 2011-04-27 DIAGNOSIS — Z9889 Other specified postprocedural states: Secondary | ICD-10-CM

## 2011-04-27 DIAGNOSIS — Z853 Personal history of malignant neoplasm of breast: Secondary | ICD-10-CM

## 2011-06-04 ENCOUNTER — Ambulatory Visit: Payer: PRIVATE HEALTH INSURANCE | Admitting: Endocrinology

## 2011-06-06 ENCOUNTER — Ambulatory Visit (INDEPENDENT_AMBULATORY_CARE_PROVIDER_SITE_OTHER): Payer: PRIVATE HEALTH INSURANCE | Admitting: Endocrinology

## 2011-06-06 ENCOUNTER — Other Ambulatory Visit (INDEPENDENT_AMBULATORY_CARE_PROVIDER_SITE_OTHER): Payer: PRIVATE HEALTH INSURANCE

## 2011-06-06 ENCOUNTER — Encounter: Payer: Self-pay | Admitting: Endocrinology

## 2011-06-06 VITALS — BP 104/74 | HR 75 | Temp 98.1°F | Ht 67.0 in | Wt 259.0 lb

## 2011-06-06 DIAGNOSIS — E890 Postprocedural hypothyroidism: Secondary | ICD-10-CM

## 2011-06-06 LAB — TSH: TSH: 1.58 u[IU]/mL (ref 0.35–5.50)

## 2011-06-06 NOTE — Progress Notes (Signed)
Subjective:    Patient ID: Kayla Price, female    DOB: [birth year], 55 y.o.   MRN: [id number]  HPI Pt had i-131 rx for hyperthyroidism in 2009, and now takes synthroid.  She also has h/o hyperglycemia, but random glucose was normal 2 mos ago.    Past Medical History  Diagnosis Date  . HIV DISEASE 03/27/2006  . 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 Unspecified vitamin D deficiency 08/06/2007  . HYPERLIPIDEMIA, MIXED 12/15/2007  . HYPERTENSION 03/27/2006  . PVD 04/07/2007  . Alopecia areata 11/28/2009  . MENORRHAGIA, POSTMENOPAUSAL 02/03/2009  . TRIGGER FINGER 05/06/2008  . HIP FRACTURE, RIGHT 05/06/2008  . BREAST CANCER, HX OF 03/27/2006  . OSTEOARTHROSIS, LOCAL, SCND, UNSPC SITE 04/07/2007  . Fasting hyperglycemia   . Tinea capitis     Past Surgical History  Procedure Date  . Tubal ligation   . Breast lumpectomy   . Lymph node dissection 2005  . Port-a-cath removal 2006    insertion 2005    History   Social History  . Marital Status: Widowed    Spouse Name: N/A    Number of Children: N/A  . Years of Education: N/A   Occupational History  . Paramedic    Social History Main Topics  . Smoking status: Never Smoker   . Smokeless tobacco: Never Used  . Alcohol Use: Not on file  . Drug Use: No  . Sexually Active: Not on file   Other Topics Concern  . Not on file   Social History Narrative   single    Current Outpatient Prescriptions on File Prior to Visit  Medication Sig Dispense Refill  . ammonium lactate (LAC-HYDRIN) 12 % lotion Apply 1 application topically 2 (two) times daily.        . ATRIPLA 600-200-300 MG per tablet TAKE 1 TABLET BY MOUTH ONCE DAILY  30 tablet  10  . clobetasol (TEMOVATE) 0.05 % cream Apply 1 application topically 2 (two) times daily.        Marland Kitchen ketoconazole (NIZORAL) 2 % shampoo Apply twice weekly for 4 weeks with 3 days between each application       . levothyroxine (SYNTHROID, LEVOTHROID) 150 MCG tablet Take 1 tablet (150 mcg  total) by mouth daily.  30 tablet  11  . loratadine (CLARITIN) 10 MG tablet Take 10 mg by mouth daily. During allergy season        . Multiple Vitamins-Minerals (WOMENS MULTI VITAMIN & MINERAL PO) Take 1 tablet by mouth daily.        . naproxen sodium (ANAPROX) 220 MG tablet Take 220 mg by mouth as needed.        Marland Kitchen olmesartan-hydrochlorothiazide (BENICAR HCT) 40-25 MG per tablet Take 1 tablet by mouth daily.  30 tablet  11  . raltegravir (ISENTRESS) 400 MG tablet Take 400 mg by mouth 2 (two) times daily.         Current Facility-Administered Medications on File Prior to Visit  Medication Dose Route Frequency Provider Last Rate Last Dose  . influenza (>/= 3 years) inactive virus vaccine (FLUZONE/FLUVIRIN) injection 0.5 mL  0.5 mL Intramuscular Once Acey Lav, MD        Allergies  Allergen Reactions  . Lisinopril     Family History  Problem Relation Age of Onset  . Heart attack Brother     Massive MI in 27s  . Heart disease Brother     CAD  . Diabetes Mother   . Diabetes Sister   .  Thyroid disease Sister     had I-131 rx of hyperthyroidism  . Diabetes Sister     BP 104/74  Pulse 75  Temp(Src) 98.1 F (36.7 C) (Oral)  Ht 5\' 7"  (1.702 m)  Wt 259 lb (117.482 kg)  BMI 40.57 kg/m2  SpO2 97%  Review of Systems Denies weight change.    Objective:   Physical Exam VITAL SIGNS:  See vs page GENERAL: no distress Eyes: slight bilat proptosis. NECK: There is no palpable thyroid enlargement.  No thyroid nodule is palpable.  No palpable lymphadenopathy at the anterior neck.    Lab Results  Component Value Date   TSH 1.58 06/06/2011   T3TOTAL 271.2* 12/15/2007   T4TOTAL 13.9* 12/15/2007      Assessment & Plan:  Post-i-131 hypothyroidism, well-replaced

## 2011-06-06 NOTE — Patient Instructions (Addendum)
blood tests are being ordered for you today.  please call [phone] to hear your test results.  You will be prompted to enter the 9-digit "MRN" number that appears at the top left of this page, followed by #.  Then you will hear the message. Please return in 1 year (update: i left message on phone-tree:  rx as we discussed) 

## 2011-08-02 ENCOUNTER — Telehealth: Payer: Self-pay | Admitting: *Deleted

## 2011-08-02 DIAGNOSIS — B20 Human immunodeficiency virus [HIV] disease: Secondary | ICD-10-CM

## 2011-08-02 MED ORDER — RALTEGRAVIR POTASSIUM 400 MG PO TABS: 400.0000 mg | ORAL_TABLET | Freq: Two times a day (BID) | ORAL | Status: DC

## 2011-08-02 NOTE — Telephone Encounter (Signed)
C/o dry eyes. Had a problem with a blocked tear duct last year & saw an opthamologist. Has tried Murine which does not help. Has not tried otc aftificial tears. Urged her to call her eye dr back & get an appt asap. States her eyes feel grainy under the lids. States she will. Also c/o dry mouth. Told her this could be from many things. She is concerned about diabetes.states she had it when she was pregnant 22 years ago. Wants to be tested for it when she comes next Monday for her usual labs.  To md to add order for this to labs to be done monday if desired.

## 2011-08-06 ENCOUNTER — Other Ambulatory Visit: Payer: PRIVATE HEALTH INSURANCE

## 2011-08-06 DIAGNOSIS — B20 Human immunodeficiency virus [HIV] disease: Secondary | ICD-10-CM

## 2011-08-06 LAB — COMPLETE METABOLIC PANEL WITH GFR
ALT: 36 U/L — ABNORMAL HIGH (ref 0–35)
BUN: 21 mg/dL (ref 6–23)
CO2: 32 mEq/L (ref 19–32)
Calcium: 10.6 mg/dL — ABNORMAL HIGH (ref 8.4–10.5)
Creat: 1.05 mg/dL (ref 0.50–1.10)
GFR, Est African American: 69 mL/min
GFR, Est Non African American: 60 mL/min
Glucose, Bld: 96 mg/dL (ref 70–99)
Total Bilirubin: 0.3 mg/dL (ref 0.3–1.2)

## 2011-08-06 LAB — CBC WITH DIFFERENTIAL/PLATELET
Eosinophils Absolute: 0.1 10*3/uL (ref 0.0–0.7)
Eosinophils Relative: 2 % (ref 0–5)
HCT: 42.5 % (ref 36.0–46.0)
Lymphs Abs: 2 10*3/uL (ref 0.7–4.0)
MCH: 33.7 pg (ref 26.0–34.0)
MCV: 97.5 fL (ref 78.0–100.0)
Monocytes Absolute: 0.4 10*3/uL (ref 0.1–1.0)
Platelets: 195 10*3/uL (ref 150–400)
RBC: 4.36 MIL/uL (ref 3.87–5.11)

## 2011-08-06 LAB — RPR

## 2011-08-07 LAB — T-HELPER CELL (CD4) - (RCID CLINIC ONLY): CD4 % Helper T Cell: 37 % (ref 33–55)

## 2011-08-07 LAB — GC/CHLAMYDIA PROBE AMP, URINE
Chlamydia, Swab/Urine, PCR: NEGATIVE
GC Probe Amp, Urine: NEGATIVE

## 2011-08-09 ENCOUNTER — Other Ambulatory Visit: Payer: Self-pay | Admitting: *Deleted

## 2011-08-09 DIAGNOSIS — B20 Human immunodeficiency virus [HIV] disease: Secondary | ICD-10-CM

## 2011-08-09 MED ORDER — RALTEGRAVIR POTASSIUM 400 MG PO TABS: 400.0000 mg | ORAL_TABLET | Freq: Two times a day (BID) | ORAL | Status: DC

## 2011-08-20 ENCOUNTER — Ambulatory Visit (INDEPENDENT_AMBULATORY_CARE_PROVIDER_SITE_OTHER): Payer: PRIVATE HEALTH INSURANCE | Admitting: Infectious Disease

## 2011-08-20 ENCOUNTER — Encounter: Payer: Self-pay | Admitting: Infectious Disease

## 2011-08-20 DIAGNOSIS — H04129 Dry eye syndrome of unspecified lacrimal gland: Secondary | ICD-10-CM

## 2011-08-20 DIAGNOSIS — K117 Disturbances of salivary secretion: Secondary | ICD-10-CM

## 2011-08-20 DIAGNOSIS — E890 Postprocedural hypothyroidism: Secondary | ICD-10-CM

## 2011-08-20 DIAGNOSIS — R682 Dry mouth, unspecified: Secondary | ICD-10-CM

## 2011-08-20 DIAGNOSIS — B20 Human immunodeficiency virus [HIV] disease: Secondary | ICD-10-CM

## 2011-08-20 DIAGNOSIS — H04123 Dry eye syndrome of bilateral lacrimal glands: Secondary | ICD-10-CM

## 2011-08-20 LAB — IGE: IgE (Immunoglobulin E), Serum: 29 IU/mL (ref 0.0–180.0)

## 2011-08-20 LAB — SEDIMENTATION RATE: Sed Rate: 14 mm/hr (ref 0–22)

## 2011-08-20 NOTE — Assessment & Plan Note (Signed)
See above. Will work up for sjogrens and refer to Rheumatology

## 2011-08-20 NOTE — Progress Notes (Signed)
  Subjective:    Patient ID: Kayla Price, female    DOB: 06-24-1956, 56 y.o.   MRN: [id number]  HPI  56 year old [place] American female with HIV perfectly controlled on Atripla and isentress also with post thyroid ablation hypothyroidism being managed by Dr. Everardo All. She returns to clinic for followup. She has been suffering from dry eyes, and dry mouth for several months despite using artificial tears for her eyes and she requested prescription "restasis." She does continue to suffer from chonic arthritic pains in knees and esp with standing. I agreed to work her up for possible SJogrens. I spent greater than 45 minutes with the patient including greater than 50% of time in face to face counsel of the patient and in coordination of their care.    Review of Systems  Constitutional: Negative for fever, chills, diaphoresis, activity change, appetite change, fatigue and unexpected weight change.  HENT: Negative for congestion, sore throat, rhinorrhea, sneezing, trouble swallowing and sinus pressure.   Eyes: Negative for photophobia and visual disturbance.  Respiratory: Negative for cough, chest tightness, shortness of breath, wheezing and stridor.   Cardiovascular: Negative for chest pain, palpitations and leg swelling.  Gastrointestinal: Negative for nausea, vomiting, abdominal pain, diarrhea, constipation, blood in stool, abdominal distention and anal bleeding.  Genitourinary: Negative for dysuria, hematuria, flank pain and difficulty urinating.  Musculoskeletal: Positive for back pain and arthralgias. Negative for myalgias, joint swelling and gait problem.  Skin: Negative for color change, pallor, rash and wound.  Neurological: Negative for dizziness, tremors, weakness and light-headedness.  Hematological: Negative for adenopathy. Does not bruise/bleed easily.  Psychiatric/Behavioral: Negative for behavioral problems, confusion, sleep disturbance, dysphoric mood, decreased concentration  and agitation.       Objective:   Physical Exam  Constitutional: She is oriented to person, place, and time. She appears well-developed and well-nourished. No distress.  HENT:  Head: Normocephalic and atraumatic.  Mouth/Throat: Oropharynx is clear and moist. No oropharyngeal exudate.  Eyes: Conjunctivae and EOM are normal. Pupils are equal, round, and reactive to light. No scleral icterus.       Bilateral proptosis  Neck: Normal range of motion. Neck supple. No JVD present.  Cardiovascular: Normal rate, regular rhythm and normal heart sounds.  Exam reveals no gallop and no friction rub.   No murmur heard. Pulmonary/Chest: Effort normal and breath sounds normal. No respiratory distress. She has no wheezes. She has no rales. She exhibits no tenderness.  Abdominal: She exhibits no distension and no mass. There is no tenderness. There is no rebound and no guarding.  Musculoskeletal: She exhibits no edema and no tenderness.  Lymphadenopathy:    She has no cervical adenopathy.  Neurological: She is alert and oriented to person, place, and time. She has normal reflexes. She exhibits normal muscle tone. Coordination normal.  Skin: Skin is warm and dry. She is not diaphoretic. No erythema. No pallor.  Psychiatric: She has a normal mood and affect. Her behavior is normal. Judgment and thought content normal.          Assessment & Plan:  HIV DISEASE Perfect control  Dry eyes Will work up for Sjogrens  HYPOTHYROIDISM, POST-RADIATION On thyroid replacement. Followed by Dr. Haynes Kerns  Dry mouth See above. Will work up for sjogrens and refer to Rheumatology

## 2011-08-20 NOTE — Assessment & Plan Note (Signed)
On thyroid replacement. Followed by Dr. Haynes Kerns

## 2011-08-20 NOTE — Assessment & Plan Note (Signed)
Will work up for [employer]tr[employer]

## 2011-08-20 NOTE — Assessment & Plan Note (Signed)
Perfect control 

## 2011-08-21 LAB — C3 AND C4: C4 Complement: 30 mg/dL (ref 10–40)

## 2011-08-21 LAB — LUPUS ANTICOAGULANT PANEL
DRVVT: 41.4 secs (ref 34.1–42.2)
Lupus Anticoagulant: NOT DETECTED
PTT Lupus Anticoagulant: 39.8 secs (ref 28.0–43.0)

## 2011-08-21 LAB — SJOGRENS SYNDROME-B EXTRACTABLE NUCLEAR ANTIBODY: SSB (La) (ENA) Antibody, IgG: 1 AU/mL (ref <30)

## 2011-08-21 LAB — ANA: Anti Nuclear Antibody(ANA): NEGATIVE

## 2011-08-26 ENCOUNTER — Other Ambulatory Visit: Payer: Self-pay | Admitting: Infectious Disease

## 2011-08-27 ENCOUNTER — Telehealth: Payer: Self-pay | Admitting: *Deleted

## 2011-08-27 NOTE — Telephone Encounter (Signed)
Would appreciate an appt w/ Dr. Daiva Eves to discuss lab results and rash.  Transferred to scheduler to make appt.

## 2011-08-27 NOTE — Telephone Encounter (Signed)
We can see her. There is an opening at 2pm today?

## 2011-08-27 NOTE — Telephone Encounter (Signed)
Pt given appt for Wed., March 6 @ 0900.

## 2011-08-29 ENCOUNTER — Ambulatory Visit (INDEPENDENT_AMBULATORY_CARE_PROVIDER_SITE_OTHER): Payer: PRIVATE HEALTH INSURANCE | Admitting: Infectious Disease

## 2011-08-29 ENCOUNTER — Encounter: Payer: Self-pay | Admitting: Infectious Disease

## 2011-08-29 VITALS — BP 134/89 | HR 80 | Temp 97.6°F | Wt 264.0 lb

## 2011-08-29 DIAGNOSIS — R21 Rash and other nonspecific skin eruption: Secondary | ICD-10-CM

## 2011-08-29 DIAGNOSIS — E890 Postprocedural hypothyroidism: Secondary | ICD-10-CM

## 2011-08-29 DIAGNOSIS — L259 Unspecified contact dermatitis, unspecified cause: Secondary | ICD-10-CM | POA: Insufficient documentation

## 2011-08-29 DIAGNOSIS — R682 Dry mouth, unspecified: Secondary | ICD-10-CM

## 2011-08-29 DIAGNOSIS — E782 Mixed hyperlipidemia: Secondary | ICD-10-CM

## 2011-08-29 DIAGNOSIS — K117 Disturbances of salivary secretion: Secondary | ICD-10-CM

## 2011-08-29 DIAGNOSIS — B20 Human immunodeficiency virus [HIV] disease: Secondary | ICD-10-CM

## 2011-08-29 MED ORDER — ATORVASTATIN CALCIUM 10 MG PO TABS: 10.0000 mg | ORAL_TABLET | Freq: Every day | ORAL | Status: DC

## 2011-08-29 NOTE — Assessment & Plan Note (Signed)
Check tsh 

## 2011-08-29 NOTE — Assessment & Plan Note (Signed)
Add lipitor

## 2011-08-29 NOTE — Assessment & Plan Note (Signed)
Well controlled 

## 2011-08-29 NOTE — Assessment & Plan Note (Signed)
Referring to RHeum

## 2011-08-29 NOTE — Progress Notes (Signed)
Subjective:    Patient ID: Kayla Price, female    DOB: February 02, 1956, 55 y.o.   MRN: [id number]  HPI   56 year old [place] American female with HIV perfectly controlled on Atripla and isentress also with post thyroid ablation hypothyroidism being managed by Dr. Everardo All. I saw her in late February in  clinic for followup. She had  been suffering from dry eyes, and dry mouth for several months despite using artificial tears for her eyes and she requested prescription "restasis." I agreed to work her up for possible SJogrens but iniital labs including ana, anti ds dna, ssa, ssb lupus anticoagulant, immunoglobulins were all negative or normal. She returns today because of new onset of rash on left midline in back near bra strap. It was blistering and vesicular last week with burning sensation when she touched it but no underlying pain. She has tried topical emollients and steroids with some relief with steroids. She returns for discussioin of this plus her labs. We also noted with her dx of PVD and a1c of 5.7 that she should once again be on a more aggressive regimen for her cholesterol   Review of Systems  Constitutional: Negative for fever, chills, diaphoresis, activity change, appetite change, fatigue and unexpected weight change.  HENT: Negative for congestion, sore throat, rhinorrhea, sneezing, trouble swallowing and sinus pressure.   Eyes: Negative for photophobia and visual disturbance.  Respiratory: Negative for cough, chest tightness, shortness of breath, wheezing and stridor.   Cardiovascular: Negative for chest pain, palpitations and leg swelling.  Gastrointestinal: Negative for nausea, vomiting, abdominal pain, diarrhea, constipation, blood in stool, abdominal distention and anal bleeding.  Genitourinary: Negative for dysuria, hematuria, flank pain and difficulty urinating.  Musculoskeletal: Negative for myalgias, back pain, joint swelling, arthralgias and gait problem.  Skin: Positive  for rash. Negative for color change, pallor and wound.  Neurological: Negative for dizziness, tremors, weakness and light-headedness.  Hematological: Negative for adenopathy. Does not bruise/bleed easily.  Psychiatric/Behavioral: Negative for behavioral problems, confusion, sleep disturbance, dysphoric mood, decreased concentration and agitation.       Objective:   Physical Exam  Constitutional: She is oriented to person, place, and time. She appears well-developed and well-nourished. No distress.  HENT:  Head: Normocephalic and atraumatic.  Mouth/Throat: Oropharynx is clear and moist. No oropharyngeal exudate.  Eyes: Conjunctivae and EOM are normal. Pupils are equal, round, and reactive to light. No scleral icterus.  Neck: Normal range of motion. Neck supple. No JVD present.  Cardiovascular: Normal rate, regular rhythm and normal heart sounds.  Exam reveals no gallop and no friction rub.   No murmur heard. Pulmonary/Chest: Effort normal and breath sounds normal. No respiratory distress. She has no wheezes. She has no rales. She exhibits no tenderness.  Abdominal: She exhibits no distension and no mass. There is no tenderness. There is no rebound and no guarding.  Musculoskeletal: She exhibits no edema and no tenderness.  Lymphadenopathy:    She has no cervical adenopathy.  Neurological: She is alert and oriented to person, place, and time. She has normal reflexes. She exhibits normal muscle tone. Coordination normal.  Skin: Skin is warm and dry. She is not diaphoretic. No erythema. No pallor.     Psychiatric: She has a normal mood and affect. Her behavior is normal. Judgment and thought content normal.          Assessment & Plan:  Rash ? Contact dermatitis. Not a :"slam dunk" for zoster given not thorughout the dermatome and no painful prodrome.  Will continue topical agents and reasess  HYPERLIPIDEMIA, MIXED Add lipitor  HIV DISEASE Well controlled  HYPOTHYROIDISM,  POST-RADIATION Check tsh  Dry mouth Referring to RHeum

## 2011-08-29 NOTE — Assessment & Plan Note (Signed)
?   Contact dermatitis. Not a :"slam dunk" for zoster given not thorughout the dermatome and no painful prodrome. Will continue topical agents and reasess

## 2011-10-02 ENCOUNTER — Other Ambulatory Visit: Payer: Self-pay | Admitting: Otolaryngology

## 2011-11-01 ENCOUNTER — Telehealth: Payer: Self-pay

## 2011-11-01 NOTE — Telephone Encounter (Signed)
This is not good for your thyroid--please avoid

## 2011-11-01 NOTE — Telephone Encounter (Signed)
Pt informed of MD's advisement. 

## 2011-11-01 NOTE — Telephone Encounter (Signed)
Pt called requesting MD advise on if it is safe for her to take an OTC thyroid supplement with her Rx synthroid? Please advise.

## 2011-11-20 ENCOUNTER — Encounter (HOSPITAL_COMMUNITY): Payer: Self-pay | Admitting: Emergency Medicine

## 2011-11-20 ENCOUNTER — Emergency Department (INDEPENDENT_AMBULATORY_CARE_PROVIDER_SITE_OTHER)
Admission: EM | Admit: 2011-11-20 | Discharge: 2011-11-20 | Disposition: A | Discharge status: Home / Self Care | Payer: PRIVATE HEALTH INSURANCE | Source: Home / Self Care | Attending: Family Medicine | Admitting: Family Medicine

## 2011-11-20 DIAGNOSIS — L259 Unspecified contact dermatitis, unspecified cause: Secondary | ICD-10-CM

## 2011-11-20 MED ORDER — CLOBETASOL PROPIONATE 0.05 % EX CREA: 1.0000 "application " | TOPICAL_CREAM | Freq: Two times a day (BID) | CUTANEOUS | Status: DC

## 2011-11-20 MED ORDER — HYDROXYZINE HCL 25 MG PO TABS: 25.0000 mg | ORAL_TABLET | Freq: Three times a day (TID) | ORAL | Status: DC | PRN

## 2011-11-20 MED ORDER — CAMPHOR-MENTHOL 0.5-0.5 % EX LOTN: TOPICAL_LOTION | CUTANEOUS | Status: DC | PRN

## 2011-11-20 MED ORDER — TRIAMCINOLONE ACETONIDE 0.5 % EX OINT: TOPICAL_OINTMENT | Freq: Two times a day (BID) | CUTANEOUS | Status: DC

## 2011-11-20 NOTE — Discharge Instructions (Signed)
And make sure to use protective clothing to avoid re\re exposure. Take/use the prescribed medications as instructed. Followup with Dr. Algis Liming if persistent symptoms despite following treatment.    Contact Dermatitis Contact dermatitis is a reaction to certain substances that touch the skin. Contact dermatitis can be either irritant contact dermatitis or allergic contact dermatitis. Irritant contact dermatitis does not require previous exposure to the substance for a reaction to occur.Allergic contact dermatitis only occurs if you have been exposed to the substance before. Upon a repeat exposure, your body reacts to the substance.  CAUSES  Many substances can cause contact dermatitis. Irritant dermatitis is most commonly caused by repeated exposure to mildly irritating substances, such as:  Makeup.   Soaps.   Detergents.   Bleaches.   Acids.   Metal salts, such as nickel.  Allergic contact dermatitis is most commonly caused by exposure to:  Poisonous plants.   Chemicals (deodorants, shampoos).   Jewelry.   Latex.   Neomycin in triple antibiotic cream.   Preservatives in products, including clothing.  SYMPTOMS  The area of skin that is exposed may develop:  Dryness or flaking.   Redness.   Cracks.   Itching.   Pain or a burning sensation.   Blisters.  With allergic contact dermatitis, there may also be swelling in areas such as the eyelids, mouth, or genitals.  DIAGNOSIS  Your caregiver can usually tell what the problem is by doing a physical exam. In cases where the cause is uncertain and an allergic contact dermatitis is suspected, a patch skin test may be performed to help determine the cause of your dermatitis. TREATMENT Treatment includes protecting the skin from further contact with the irritating substance by avoiding that substance if possible. Barrier creams, powders, and gloves may be helpful. Your caregiver may also recommend:  Steroid creams or  ointments applied 2 times daily. For best results, soak the rash area in cool water for 20 minutes. Then apply the medicine. Cover the area with a plastic wrap. You can store the steroid cream in the refrigerator for a "chilly" effect on your rash. That may decrease itching. Oral steroid medicines may be needed in more severe cases.   Antibiotics or antibacterial ointments if a skin infection is present.   Antihistamine lotion or an antihistamine taken by mouth to ease itching.   Lubricants to keep moisture in your skin.   Burow's solution to reduce redness and soreness or to dry a weeping rash. Mix one packet or tablet of solution in 2 cups cool water. Dip a clean washcloth in the mixture, wring it out a bit, and put it on the affected area. Leave the cloth in place for 30 minutes. Do this as often as possible throughout the day.   Taking several cornstarch or baking soda baths daily if the area is too large to cover with a washcloth.  Harsh chemicals, such as alkalis or acids, can cause skin damage that is like a burn. You should flush your skin for 15 to 20 minutes with cold water after such an exposure. You should also seek immediate medical care after exposure. Bandages (dressings), antibiotics, and pain medicine may be needed for severely irritated skin.  HOME CARE INSTRUCTIONS  Avoid the substance that caused your reaction.   Keep the area of skin that is affected away from hot water, soap, sunlight, chemicals, acidic substances, or anything else that would irritate your skin.   Do not scratch the rash. Scratching may cause the rash  to become infected.   You may take cool baths to help stop the itching.   Only take over-the-counter or prescription medicines as directed by your caregiver.   See your caregiver for follow-up care as directed to make sure your skin is healing properly.  SEEK MEDICAL CARE IF:   Your condition is not better after 3 days of treatment.   You seem to be  getting worse.   You see signs of infection such as swelling, tenderness, redness, soreness, or warmth in the affected area.   You have any problems related to your medicines.  Document Released: 06/08/2000 Document Revised: 05/31/2011 Document Reviewed: 11/14/2010 [facility] Patient Information 2012 [place], [place].

## 2011-11-20 NOTE — ED Provider Notes (Signed)
History     CSN: [id number]  Arrival date & time 11/20/11  1708   First MD Initiated Contact with Patient 11/20/11 1749      Chief Complaint  Patient presents with  . Rash    (Consider location/radiation/quality/duration/timing/severity/associated sxs/prior treatment) HPI Comments: 56 year old female with complex medical history including HTN, HIV and hypothyroidism. Here complaining of a pruriginous rash in hands, arms, upper torso and neck for 4 days. Rash started a day or 2 after she was working on her yard "cutting weeds". States she was not wearing protective clothing or gloves and she picked up all the dry leaves and weeds between both arms and her chest. She has used over-the-counter natural remedies for her rash, without significant relief. Took Zyrtec for 2 days without significant relief. Describes her rash getting better in one area and reappearing in another. Denies fever or chills. Denies headache or dizziness. Denies swelling or ulcers inside her mouth. Denies difficulty breathing coughing or wheezing.   Past Medical History  Diagnosis Date  . HIV DISEASE 03/27/2006  . 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 Unspecified vitamin D deficiency 08/06/2007  . HYPERLIPIDEMIA, MIXED 12/15/2007  . HYPERTENSION 03/27/2006  . PVD 04/07/2007  . Alopecia areata 11/28/2009  . MENORRHAGIA, POSTMENOPAUSAL 02/03/2009  . TRIGGER FINGER 05/06/2008  . HIP FRACTURE, RIGHT 05/06/2008  . BREAST CANCER, HX OF 03/27/2006  . OSTEOARTHROSIS, LOCAL, SCND, UNSPC SITE 04/07/2007  . Fasting hyperglycemia   . Tinea capitis   . HIV (human immunodeficiency virus infection)     Past Surgical History  Procedure Date  . Tubal ligation   . Breast lumpectomy   . Lymph node dissection 2005  . Port-a-cath removal 2006    insertion 2005    Family History  Problem Relation Age of Onset  . Heart attack Brother     Massive MI in 40s  . Heart disease Brother     CAD  . Diabetes Mother   . Diabetes  Sister   . Thyroid disease Sister     had I-131 rx of hyperthyroidism  . Diabetes Sister     History  Substance Use Topics  . Smoking status: Never Smoker   . Smokeless tobacco: Never Used  . Alcohol Use: No    OB History    Grav Para Term Preterm Abortions TAB SAB Ect Mult Living                  Review of Systems  Constitutional: Negative for fever, chills, appetite change and fatigue.  HENT: Negative for congestion, sore throat, facial swelling, rhinorrhea, sneezing and mouth sores.   Respiratory: Negative for shortness of breath.   Cardiovascular: Negative for leg swelling.  Gastrointestinal: Negative for nausea, vomiting, abdominal pain and diarrhea.  Musculoskeletal: Negative for myalgias, joint swelling and arthralgias.  Skin: Positive for rash.  Neurological: Negative for dizziness and headaches.    Allergies  Lisinopril  Home Medications   Current Outpatient Rx  Name Route Sig Dispense Refill  . AMMONIUM LACTATE 12 % EX LOTN Topical Apply 1 application topically 2 (two) times daily.      . ATORVASTATIN CALCIUM 10 MG PO TABS Oral Take 1 tablet (10 mg total) by mouth daily. 30 tablet 11  . ATRIPLA 600-200-300 MG PO TABS  TAKE 1 TABLET BY MOUTH ONCE DAILY 30 tablet 10  . CAMPHOR-MENTHOL 0.5-0.5 % EX LOTN Topical Apply topically as needed for itching. 222 mL 0  . CLOBETASOL PROPIONATE 0.05 % EX CREA Topical Apply  1 application topically 2 (two) times daily. 45 g 0  . HYDROXYZINE HCL 25 MG PO TABS Oral Take 1 tablet (25 mg total) by mouth every 8 (eight) hours as needed for itching. 12 tablet 0  . KETOCONAZOLE 2 % EX SHAM  Apply twice weekly for 4 weeks with 3 days between each application     . LEVOTHYROXINE SODIUM 150 MCG PO TABS Oral Take 1 tablet (150 mcg total) by mouth daily. 30 tablet 11  . WOMENS MULTI VITAMIN & MINERAL PO Oral Take 1 tablet by mouth daily.      Marland Kitchen NAPROXEN SODIUM 220 MG PO TABS Oral Take 220 mg by mouth as needed.     Marland Kitchen OLMESARTAN  MEDOXOMIL-HCTZ 40-25 MG PO TABS Oral Take 1 tablet by mouth daily. 30 tablet 11  . RALTEGRAVIR POTASSIUM 400 MG PO TABS Oral Take 1 tablet (400 mg total) by mouth 2 (two) times daily. 60 tablet 3  . TRIAMCINOLONE ACETONIDE 0.5 % EX OINT Topical Apply topically 2 (two) times daily. 30 g 0    BP 125/90  Pulse 102  Temp(Src) 101.6 F (38.7 C) (Oral)  Resp 20  SpO2 98%  Physical Exam  Nursing note and vitals reviewed. Constitutional: She is oriented to person, place, and time. She appears well-developed and well-nourished. No distress.  HENT:  Head: Normocephalic and atraumatic.  Right Ear: External ear normal.  Left Ear: External ear normal.  Nose: Nose normal.  Mouth/Throat: Oropharynx is clear and moist.  Eyes: Conjunctivae are normal.  Neck: Neck supple.  Cardiovascular: Normal heart sounds.   Pulmonary/Chest: Breath sounds normal.  Abdominal: Soft. She exhibits no mass. There is no tenderness.  Lymphadenopathy:    She has no cervical adenopathy.  Neurological: She is alert and oriented to person, place, and time.  Skin:       Vesicular rash with base erythema forming lines in fore arms and dorsum of hands. Less confluent and scattered in upper anterior torso uppers arms and neck. No ulcerations, excoriations or pustules.    ED Course  Procedures (including critical care time)  Labs Reviewed - No data to display No results found.   1. Contact dermatitis       MDM  Impress contact dermatitis. In the differential HIV related rash but findings and history suggest the first. Treated with triamcinolone ointment, hydroxyzine, and Sarna lotion. Asked to followup with Dr. Daiva Eves if persistent or worsening symptoms despite following treatment.        Sharin Grave, MD 11/21/11 2000

## 2011-11-20 NOTE — ED Notes (Signed)
Pt. Stated, i've had a rash all over that has periods of flare ups since May 24, I've ried all sorts of cream and medications and it just will not go away.

## 2011-11-28 ENCOUNTER — Ambulatory Visit (INDEPENDENT_AMBULATORY_CARE_PROVIDER_SITE_OTHER): Payer: PRIVATE HEALTH INSURANCE | Admitting: Internal Medicine

## 2011-11-28 ENCOUNTER — Encounter: Payer: Self-pay | Admitting: Internal Medicine

## 2011-11-28 VITALS — BP 138/89 | HR 72 | Temp 97.9°F | Ht 67.0 in | Wt 269.5 lb

## 2011-11-28 DIAGNOSIS — L259 Unspecified contact dermatitis, unspecified cause: Secondary | ICD-10-CM

## 2011-11-28 NOTE — Progress Notes (Signed)
Patient ID: Kayla Price, female   DOB: 04-Apr-1956, 56 y.o.   MRN: [id number]     [facility]  Patient Active Problem List  Diagnoses  . HIV DISEASE  . TINEA CAPITIS  . HYPOTHYROIDISM, POST-RADIATION  . UNSPECIFIED VITAMIN D DEFICIENCY  . HYPERLIPIDEMIA, MIXED  . HYPERTENSION  . PVD  . ACUTE NASOPHARYNGITIS  . MENORRHAGIA, POSTMENOPAUSAL  . CNTC DERMATITIS&OTH ECZEMA DUE OTH CHEM PRODUCTS  . ALOPECIA  . ALOPECIA AREATA  . OSTEOARTHROSIS, LOCAL, SCND, UNSPC SITE  . TRIGGER FINGER  . HAND PAIN, RIGHT  . Other Abnormal Glucose  . ABNORMAL GLANDULAR PAPANICOLAOU SMEAR OF CERVIX  . HIP FRACTURE, RIGHT  . BROKEN TOOTH, WITH COMPLICATION  . BREAST CANCER, HX OF  . ARTHROSCOPY, LEFT KNEE, HX OF  . Obesities, morbid  . Neuropathy  . Insomnia  . Dry eyes  . Dry mouth  . Contact dermatitis    Patient's Medications  New Prescriptions   No medications on file  Previous Medications   AMMONIUM LACTATE (LAC-HYDRIN) 12 % LOTION    Apply 1 application topically 2 (two) times daily.     ATORVASTATIN (LIPITOR) 10 MG TABLET    Take 1 tablet (10 mg total) by mouth daily.   ATRIPLA 600-200-300 MG PER TABLET    TAKE 1 TABLET BY MOUTH ONCE DAILY   CAMPHOR-MENTHOL (SARNA) LOTION    Apply topically as needed for itching.   KETOCONAZOLE (NIZORAL) 2 % SHAMPOO    Apply twice weekly for 4 weeks with 3 days between each application    LEVOTHYROXINE (SYNTHROID, LEVOTHROID) 150 MCG TABLET    Take 1 tablet (150 mcg total) by mouth daily.   MULTIPLE VITAMINS-MINERALS (WOMENS MULTI VITAMIN & MINERAL PO)    Take 1 tablet by mouth daily.     NAPROXEN SODIUM (ANAPROX) 220 MG TABLET    Take 220 mg by mouth as needed.    OLMESARTAN-HYDROCHLOROTHIAZIDE (BENICAR HCT) 40-25 MG PER TABLET    Take 1 tablet by mouth daily.   RALTEGRAVIR (ISENTRESS) 400 MG TABLET    Take 1 tablet (400 mg total) by mouth 2 (two) times daily.  Modified Medications   No medications on file  Discontinued  Medications   CLOBETASOL CREAM (TEMOVATE) 0.05 %    Apply 1 application topically 2 (two) times daily.   HYDROXYZINE (ATARAX/VISTARIL) 25 MG TABLET    Take 1 tablet (25 mg total) by mouth every 8 (eight) hours as needed for itching.   TRIAMCINOLONE OINTMENT (KENALOG) 0.5 %    Apply topically 2 (two) times daily.    Subjective: Kayla Price is seen on a work in basis. She saw Dr. Daiva Eves in March with a small area of rash on her mid back. That resolved shortly after her visit in she was doing well when she developed an intensely pruritic rash on her hands, arms face and chest recently. The rash began a few days after she cut some brush that was hanging over her driveway. It was similar to poison ivy dermatitis that she's had in the past but was much more severe. She tried treating it herself at home but it did not improve so she went to the urgent care Center and was given hydroxyzine, Sarna lotion and topical triamcinolone and clobetasol. Her rash resolved over the next week and she is feeling much better. She recalls missing only one dose of her Atripla and Isentress since her last visit. That occurred when she took the hydroxyzine  and became very sleepy and fell asleep before taking her medication.  Objective: Temp: 97.9 F (36.6 C) (06/05 1504) Temp src: Oral (06/05 1504) BP: 138/89 mmHg (06/05 1504) Pulse Rate: 72  (06/05 1504)  General: She is in good spirits Skin: She has a few residual dry spots and excoriations from where her rash was. In particular she has a V-shaped area of hyperpigmentation above her right breast that remains.  Lab Results HIV 1 RNA Quant (copies/mL)  Date Value  08/06/2011 <20   03/26/2011 <20   09/13/2010 <20      CD4 T Cell Abs (cmm)  Date Value  08/06/2011 720   03/26/2011 640   05/15/2010 720      Assessment: Her HIV infection is under excellent control. She is scheduled for repeat lab work and then a visit with Dr. Daiva Eves in September.  She probably  had contact dermatitis from exposure to poison ivy or poison oak. It has now resolved.  Plan: 1. Continue current medications 2. Followup with Dr. Daiva Eves in September   Cliffton Asters, MD [facility] [phone] pager   [phone] cell 11/28/2011, 3:31 PM

## 2011-12-03 ENCOUNTER — Ambulatory Visit (INDEPENDENT_AMBULATORY_CARE_PROVIDER_SITE_OTHER): Payer: PRIVATE HEALTH INSURANCE | Admitting: *Deleted

## 2011-12-03 ENCOUNTER — Other Ambulatory Visit: Payer: Self-pay | Admitting: Infectious Disease

## 2011-12-03 DIAGNOSIS — B20 Human immunodeficiency virus [HIV] disease: Secondary | ICD-10-CM

## 2011-12-03 DIAGNOSIS — Z124 Encounter for screening for malignant neoplasm of cervix: Secondary | ICD-10-CM

## 2011-12-03 NOTE — Patient Instructions (Signed)
Your results will be ready in about a week.  I will mail them to you.  Thank you for coming to the Center for your care.  Kiaja Shorty,  RN 

## 2011-12-03 NOTE — Progress Notes (Signed)
  Subjective:     Kayla Price is a 56 y.o. woman who comes in today for a  pap smear only.  Previous abnormal Pap smears: yes. Contraception:  Condoms, menopausal  Objective:    There were no vitals taken for this visit. Pelvic Exam:  Pap smear obtained.   Assessment:    Screening pap smear.   Plan:    Follow up in one year, or as indicated by Pap results.  Pt declined condoms today.  Pt given educational materials re:  HIV and women, heart disease, diet, exercise, nutrition, PAP smears and self-esteem.  Pt obtains annual f/u mammograms each October at the [facility].

## 2011-12-11 ENCOUNTER — Encounter: Payer: Self-pay | Admitting: *Deleted

## 2012-01-06 ENCOUNTER — Emergency Department (HOSPITAL_COMMUNITY)
Admission: EM | Admit: 2012-01-06 | Discharge: 2012-01-07 | Disposition: A | Discharge status: Home / Self Care | Payer: 59 | Attending: Emergency Medicine | Admitting: Emergency Medicine

## 2012-01-06 ENCOUNTER — Encounter (HOSPITAL_COMMUNITY): Payer: Self-pay | Admitting: *Deleted

## 2012-01-06 DIAGNOSIS — Z853 Personal history of malignant neoplasm of breast: Secondary | ICD-10-CM | POA: Insufficient documentation

## 2012-01-06 DIAGNOSIS — J029 Acute pharyngitis, unspecified: Secondary | ICD-10-CM | POA: Insufficient documentation

## 2012-01-06 DIAGNOSIS — Z21 Asymptomatic human immunodeficiency virus [HIV] infection status: Secondary | ICD-10-CM | POA: Insufficient documentation

## 2012-01-06 DIAGNOSIS — R1310 Dysphagia, unspecified: Secondary | ICD-10-CM | POA: Insufficient documentation

## 2012-01-06 DIAGNOSIS — Z79899 Other long term (current) drug therapy: Secondary | ICD-10-CM | POA: Insufficient documentation

## 2012-01-06 DIAGNOSIS — I10 Essential (primary) hypertension: Secondary | ICD-10-CM | POA: Insufficient documentation

## 2012-01-06 DIAGNOSIS — R002 Palpitations: Secondary | ICD-10-CM | POA: Insufficient documentation

## 2012-01-06 MED ORDER — KETOROLAC TROMETHAMINE 30 MG/ML IJ SOLN
30.0000 mg | Freq: Once | INTRAMUSCULAR | Status: AC
  Administered 2012-01-06: 30 mg via INTRAVENOUS
  Filled 2012-01-06: qty 1

## 2012-01-06 MED ORDER — AMOXICILLIN-POT CLAVULANATE 875-125 MG PO TABS: 1.0000 | ORAL_TABLET | Freq: Two times a day (BID) | ORAL | Status: DC

## 2012-01-06 MED ORDER — SODIUM CHLORIDE 0.9 % IV BOLUS (SEPSIS)
1000.0000 mL | Freq: Once | INTRAVENOUS | Status: AC
  Administered 2012-01-06: 1000 mL via INTRAVENOUS

## 2012-01-06 MED ORDER — DEXTROSE 5 % IV SOLN
1.0000 g | Freq: Once | INTRAVENOUS | Status: AC
  Administered 2012-01-06: 1 g via INTRAVENOUS
  Filled 2012-01-06: qty 10

## 2012-01-06 MED ORDER — ONDANSETRON HCL 4 MG/2ML IJ SOLN
4.0000 mg | Freq: Once | INTRAMUSCULAR | Status: AC
  Administered 2012-01-06: 4 mg via INTRAVENOUS
  Filled 2012-01-06: qty 2

## 2012-01-06 MED ORDER — SODIUM CHLORIDE 0.9 % IV BOLUS (SEPSIS)
500.0000 mL | Freq: Once | INTRAVENOUS | Status: AC
  Administered 2012-01-06: 500 mL via INTRAVENOUS

## 2012-01-06 MED ORDER — OXYCODONE-ACETAMINOPHEN 5-325 MG PO TABS: 1.0000 | ORAL_TABLET | Freq: Four times a day (QID) | ORAL | Status: DC | PRN

## 2012-01-06 MED ORDER — MORPHINE SULFATE 4 MG/ML IJ SOLN
4.0000 mg | Freq: Once | INTRAMUSCULAR | Status: AC
  Administered 2012-01-06: 4 mg via INTRAVENOUS
  Filled 2012-01-06: qty 1

## 2012-01-06 MED ORDER — ONDANSETRON 8 MG PO TBDP: 8.0000 mg | ORAL_TABLET | Freq: Three times a day (TID) | ORAL | Status: DC | PRN

## 2012-01-06 MED ORDER — ACETAMINOPHEN 325 MG PO TABS
650.0000 mg | ORAL_TABLET | Freq: Once | ORAL | Status: AC
  Administered 2012-01-06: 650 mg via ORAL
  Filled 2012-01-06: qty 2

## 2012-01-06 NOTE — ED Notes (Signed)
Patient is alert and oriented x3.  She is complaining of sinus issues with drainage, dysphaga and fever that Started on Friday.  She currently rates her pain level 9 of 10 in her throat

## 2012-01-06 NOTE — ED Notes (Signed)
Patient is alert and oriented x3.  She has been feeling sick since Friday with sinus drainage and fever. She is currently rating her pain 9 of 10 in the throat area.

## 2012-01-06 NOTE — ED Notes (Signed)
MD at bedside.  EDP Pickering present to evaluate this pt

## 2012-01-06 NOTE — ED Notes (Signed)
Pt was given a sandwich and stated that she didn't have difficulty swallowing.  She did have eat her sandwich slowly.

## 2012-01-06 NOTE — ED Provider Notes (Signed)
History     CSN: [id number]  Arrival date & time 01/06/12  1818   First MD Initiated Contact with Patient 01/06/12 1850      Chief Complaint  Patient presents with  . Dysphagia  . Palpitations  . Sinusitis  . Fever    (Consider location/radiation/quality/duration/timing/severity/associated sxs/prior treatment) Patient is a 56 y.o. female presenting with palpitations, sinusitis, and fever.  Palpitations  Associated symptoms include a fever. Pertinent negatives include no abdominal pain and no cough.  Sinusitis  Associated symptoms include sore throat. Pertinent negatives include no cough.  Fever Primary symptoms of the febrile illness include fever and fatigue. Primary symptoms do not include cough or abdominal pain.   patient presents with sinus drainage sore throat and fevers that began Friday. No cough. No nausea vomiting diarrhea. She states she feels bad all over. Pain is worse with swallowing. She's had no clear sick contacts. She states she is HIV positive, but is undetectable. No shortness of breath. No hemoptysis.  Past Medical History  Diagnosis Date  . HIV DISEASE 03/27/2006  . 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 Unspecified vitamin D deficiency 08/06/2007  . HYPERLIPIDEMIA, MIXED 12/15/2007  . HYPERTENSION 03/27/2006  . PVD 04/07/2007  . Alopecia areata 11/28/2009  . MENORRHAGIA, POSTMENOPAUSAL 02/03/2009  . TRIGGER FINGER 05/06/2008  . HIP FRACTURE, RIGHT 05/06/2008  . BREAST CANCER, HX OF 03/27/2006  . OSTEOARTHROSIS, LOCAL, SCND, UNSPC SITE 04/07/2007  . Fasting hyperglycemia   . Tinea capitis   . HIV (human immunodeficiency virus infection)     Past Surgical History  Procedure Date  . Tubal ligation   . Breast lumpectomy   . Lymph node dissection 2005  . Port-a-cath removal 2006    insertion 2005    Family History  Problem Relation Age of Onset  . Heart attack Brother     Massive MI in 70s  . Heart disease Brother     CAD  . Diabetes Mother     . Diabetes Sister   . Thyroid disease Sister     had I-131 rx of hyperthyroidism  . Diabetes Sister     History  Substance Use Topics  . Smoking status: Never Smoker   . Smokeless tobacco: Never Used  . Alcohol Use: No    OB History    Grav Para Term Preterm Abortions TAB SAB Ect Mult Living                  Review of Systems  Constitutional: Positive for fever, appetite change and fatigue.  HENT: Positive for sore throat.   Respiratory: Negative for cough.   Cardiovascular: Positive for palpitations.  Gastrointestinal: Negative for abdominal pain.  Genitourinary: Negative for flank pain.  Musculoskeletal: Negative for joint swelling.  Neurological: Negative for syncope.    Allergies  Lisinopril  Home Medications   Current Outpatient Rx  Name Route Sig Dispense Refill  . ATORVASTATIN CALCIUM 10 MG PO TABS Oral Take 10 mg by mouth daily.    Marland Kitchen CAMPHOR-MENTHOL 0.5-0.5 % EX LOTN Topical Apply 1 application topically as needed.    Marland Kitchen EMTRICITABINE-TENOFOVIR 200-300 MG PO TABS Oral Take 1 tablet by mouth daily.    Marland Kitchen LEVOTHYROXINE SODIUM 150 MCG PO TABS Oral Take 150 mcg by mouth daily.    . WOMENS MULTI VITAMIN & MINERAL PO Oral Take 1 tablet by mouth daily.      Marland Kitchen NAPROXEN SODIUM 220 MG PO TABS Oral Take 220 mg by mouth as needed. pain    .  OLMESARTAN MEDOXOMIL-HCTZ 40-25 MG PO TABS Oral Take 1 tablet by mouth daily.    Marland Kitchen VITAMIN B-6 100 MG PO TABS Oral Take 100 mg by mouth daily.    Marland Kitchen RALTEGRAVIR POTASSIUM 400 MG PO TABS Oral Take 400 mg by mouth 2 (two) times daily.    Marland Kitchen VITAMIN C 250 MG PO TABS Oral Take 250 mg by mouth daily.    . AMOXICILLIN-POT CLAVULANATE 875-125 MG PO TABS Oral Take 1 tablet by mouth 2 (two) times daily. 10 tablet 0  . ONDANSETRON 8 MG PO TBDP Oral Take 1 tablet (8 mg total) by mouth every 8 (eight) hours as needed for nausea. 20 tablet 0  . OXYCODONE-ACETAMINOPHEN 5-325 MG PO TABS Oral Take 1-2 tablets by mouth every 6 (six) hours as needed for  pain. 20 tablet 0    BP 106/64  Pulse 76  Temp 98.3 F (36.8 C) (Oral)  Resp 13  SpO2 98%  Physical Exam  Constitutional: She is oriented to person, place, and time. She appears well-developed and well-nourished. No distress.  HENT:  Head: Normocephalic.  Mouth/Throat: Oropharyngeal exudate present.       Posterior pharyngeal erythema and edema with mild exudate. Uvula is midline.  Eyes: Pupils are equal, round, and reactive to light.  Neck: No tracheal deviation present. No thyromegaly present.  Cardiovascular: Normal rate.   Pulmonary/Chest: Effort normal and breath sounds normal.  Abdominal: She exhibits no distension. There is no tenderness.  Musculoskeletal: Normal range of motion.  Lymphadenopathy:    She has cervical adenopathy.  Neurological: She is alert and oriented to person, place, and time.  Skin: Skin is warm. She is not diaphoretic. There is erythema.    ED Course  Procedures (including critical care time)   Labs Reviewed  RAPID STREP SCREEN  STREP A DNA PROBE  LAB REPORT - SCANNED   No results found.   1. Pharyngitis       MDM  Patient apparent pharyngitis. HIV positive but undetectable viral load. Lungs are clear. Patient feels somewhat better after treatment. She'll be empirically treated with antibiotics and was given pain medicines and Zofran for followup.        Juliet Rude. Rubin Payor, MD 01/10/12 0100

## 2012-01-08 LAB — STREP A DNA PROBE

## 2012-01-11 ENCOUNTER — Emergency Department (HOSPITAL_COMMUNITY)
Admission: EM | Admit: 2012-01-11 | Discharge: 2012-01-11 | Disposition: A | Discharge status: Home / Self Care | Payer: 59 | Attending: Emergency Medicine | Admitting: Emergency Medicine

## 2012-01-11 ENCOUNTER — Encounter (HOSPITAL_COMMUNITY): Payer: Self-pay | Admitting: Emergency Medicine

## 2012-01-11 DIAGNOSIS — E785 Hyperlipidemia, unspecified: Secondary | ICD-10-CM | POA: Insufficient documentation

## 2012-01-11 DIAGNOSIS — Z853 Personal history of malignant neoplasm of breast: Secondary | ICD-10-CM | POA: Insufficient documentation

## 2012-01-11 DIAGNOSIS — J029 Acute pharyngitis, unspecified: Secondary | ICD-10-CM | POA: Insufficient documentation

## 2012-01-11 DIAGNOSIS — I10 Essential (primary) hypertension: Secondary | ICD-10-CM | POA: Insufficient documentation

## 2012-01-11 DIAGNOSIS — M1990 Unspecified osteoarthritis, unspecified site: Secondary | ICD-10-CM | POA: Insufficient documentation

## 2012-01-11 DIAGNOSIS — Z21 Asymptomatic human immunodeficiency virus [HIV] infection status: Secondary | ICD-10-CM | POA: Insufficient documentation

## 2012-01-11 DIAGNOSIS — Z79899 Other long term (current) drug therapy: Secondary | ICD-10-CM | POA: Insufficient documentation

## 2012-01-11 DIAGNOSIS — E039 Hypothyroidism, unspecified: Secondary | ICD-10-CM | POA: Insufficient documentation

## 2012-01-11 MED ORDER — BENZONATATE 100 MG PO CAPS: 100.0000 mg | ORAL_CAPSULE | Freq: Three times a day (TID) | ORAL | Status: AC

## 2012-01-11 MED ORDER — ALBUTEROL SULFATE HFA 108 (90 BASE) MCG/ACT IN AERS
2.0000 | INHALATION_SPRAY | RESPIRATORY_TRACT | Status: DC | PRN
  Administered 2012-01-11: 2 via RESPIRATORY_TRACT
  Filled 2012-01-11: qty 6.7

## 2012-01-11 MED ORDER — HYDROCODONE-ACETAMINOPHEN 7.5-500 MG/15ML PO SOLN: 30.0000 mL | Freq: Four times a day (QID) | ORAL | Status: AC | PRN

## 2012-01-11 MED ORDER — AZITHROMYCIN 250 MG PO TABS: 250.0000 mg | ORAL_TABLET | Freq: Every day | ORAL | Status: AC

## 2012-01-11 NOTE — ED Provider Notes (Signed)
History     CSN: [id number]  Arrival date & time 01/11/12  1242   First MD Initiated Contact with Patient 01/11/12 1253      Chief Complaint  Patient presents with  . Sore Throat    (Consider location/radiation/quality/duration/timing/severity/associated sxs/prior treatment) HPI  Pt to the ER with complaints of sore throat for 8 days. She was seen 5 days ago in the ER and given a prescription for antibiotics. She states that it did not work and that she still having sore throat. She states that she came one week ago she had a fever but has not had a fever since then. She denies having nausea, vomiting, diarrhea, weakness. She states that the pain is on both sides of her throat. She denies having shortness of breath or difficulty breathing Handling secretions well. VSS/NAD.  Past Medical History  Diagnosis Date  . HIV DISEASE 03/27/2006  . 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 Unspecified vitamin D deficiency 08/06/2007  . HYPERLIPIDEMIA, MIXED 12/15/2007  . HYPERTENSION 03/27/2006  . PVD 04/07/2007  . Alopecia areata 11/28/2009  . MENORRHAGIA, POSTMENOPAUSAL 02/03/2009  . TRIGGER FINGER 05/06/2008  . HIP FRACTURE, RIGHT 05/06/2008  . BREAST CANCER, HX OF 03/27/2006  . OSTEOARTHROSIS, LOCAL, SCND, UNSPC SITE 04/07/2007  . Fasting hyperglycemia   . Tinea capitis   . HIV (human immunodeficiency virus infection)     Past Surgical History  Procedure Date  . Tubal ligation   . Breast lumpectomy   . Lymph node dissection 2005  . Port-a-cath removal 2006    insertion 2005    Family History  Problem Relation Age of Onset  . Heart attack Brother     Massive MI in 66s  . Heart disease Brother     CAD  . Diabetes Mother   . Diabetes Sister   . Thyroid disease Sister     had I-131 rx of hyperthyroidism  . Diabetes Sister     History  Substance Use Topics  . Smoking status: Never Smoker   . Smokeless tobacco: Never Used  . Alcohol Use: No    OB History    Grav Para  Term Preterm Abortions TAB SAB Ect Mult Living                  Review of Systems   HEENT: denies blurry vision or change in hearing, + sore throat and cough PULMONARY: Denies difficulty breathing and SOB CARDIAC: denies chest pain or heart palpitations MUSCULOSKELETAL:  denies being unable to ambulate ABDOMEN AL: denies abdominal pain GU: denies loss of bowel or urinary control NEURO: denies numbness and tingling in extremities SKIN: no new rashes PSYCH: patient denies anxiety or depression. NECK: Pt denies having neck pain     Allergies  Lisinopril  Home Medications   Current Outpatient Rx  Name Route Sig Dispense Refill  . AMOXICILLIN-POT CLAVULANATE 875-125 MG PO TABS Oral Take 1 tablet by mouth 2 (two) times daily.    . ATORVASTATIN CALCIUM 10 MG PO TABS Oral Take 10 mg by mouth daily.    . EFAVIRENZ-EMTRICITAB-TENOFOVIR 600-200-300 MG PO TABS Oral Take 1 tablet by mouth at bedtime.    . WOMENS MULTI VITAMIN & MINERAL PO Oral Take 1 tablet by mouth daily.      Marland Kitchen NAPROXEN SODIUM 220 MG PO TABS Oral Take 220 mg by mouth as needed. pain    . OLMESARTAN MEDOXOMIL-HCTZ 40-25 MG PO TABS Oral Take 1 tablet by mouth daily.    . OXYCODONE-ACETAMINOPHEN  5-325 MG PO TABS Oral Take 1-2 tablets by mouth every 6 (six) hours as needed.    Marland Kitchen VITAMIN B-6 100 MG PO TABS Oral Take 100 mg by mouth daily.    Marland Kitchen RALTEGRAVIR POTASSIUM 400 MG PO TABS Oral Take 400 mg by mouth 2 (two) times daily.    Marland Kitchen VITAMIN C 250 MG PO TABS Oral Take 250 mg by mouth daily.    . AZITHROMYCIN 250 MG PO TABS Oral Take 1 tablet (250 mg total) by mouth daily. Take first 2 tablets together, then 1 every day until finished. 6 tablet 0  . BENZONATATE 100 MG PO CAPS Oral Take 1 capsule (100 mg total) by mouth every 8 (eight) hours. 21 capsule 0  . CAMPHOR-MENTHOL 0.5-0.5 % EX LOTN Topical Apply 1 application topically as needed.    Marland Kitchen HYDROCODONE-ACETAMINOPHEN 7.5-500 MG/15ML PO SOLN Oral Take 30 mLs by mouth every 6  (six) hours as needed for pain. 120 mL 0  . LEVOTHYROXINE SODIUM 150 MCG PO TABS Oral Take 150 mcg by mouth daily.      BP 115/80  Pulse 78  Temp 98.7 F (37.1 C) (Oral)  Resp 18  SpO2 96%  Physical Exam  Nursing note and vitals reviewed. Constitutional: She appears well-developed and well-nourished. No distress.  HENT:  Head: Normocephalic and atraumatic.  Eyes: Pupils are equal, round, and reactive to light.  Neck: Normal range of motion. Neck supple.  Cardiovascular: Normal rate and regular rhythm.   Pulmonary/Chest: Effort normal.  Abdominal: Soft.  Neurological: She is alert.  Skin: Skin is warm and dry.    ED Course  Procedures (including critical care time)  Labs Reviewed - No data to display No results found.   1. Pharyngitis       MDM  Shouldn't is HIV positive but has undetectable viral loads. She saw a provider in the ER 1 week ago who prescribed her Augmentin and pain medication. She states that it did not do anything for her loss of voice or her sore throat. I discussed with the patient that her symptoms are most likely viral as antibiotics did not work. Due to her HIV positive status I will cover her with a second antibiotic and have urged her that she needs to followup with Dr. Algis Liming within the next week. Her vital signs are all within normal limits Gensini warning signs on exam.  He shouldn't given a prescription for azithromycin, Tessalon Perles, albuterol inhaler, and Lortab elixir.  Pt has been advised of the symptoms that warrant their return to the ED. Patient has voiced understanding and has agreed to follow-up with the PCP or specialist.         Dorthula Matas, PA 01/11/12 1438

## 2012-01-11 NOTE — ED Notes (Signed)
Warm blanket given

## 2012-01-11 NOTE — ED Notes (Signed)
Pt seen on 7/14 for sore throat, dx with pharyngitis. Pt given pain medication and abx. Pt states she is almost finished with her abx and is out of her pain medication but continues to have sore throat and occasional cough.

## 2012-01-11 NOTE — ED Notes (Signed)
Reports being sick for 2 weeks and no relief with outpt therapies.

## 2012-01-12 ENCOUNTER — Other Ambulatory Visit: Payer: Self-pay | Admitting: Endocrinology

## 2012-01-12 NOTE — ED Provider Notes (Signed)
Medical screening examination/treatment/procedure(s) were performed by non-physician practitioner and as supervising physician I was immediately available for consultation/collaboration.   Suzi Roots, MD 01/12/12 [phone]

## 2012-01-14 ENCOUNTER — Encounter: Payer: Self-pay | Admitting: Internal Medicine

## 2012-01-14 ENCOUNTER — Ambulatory Visit (INDEPENDENT_AMBULATORY_CARE_PROVIDER_SITE_OTHER): Payer: PRIVATE HEALTH INSURANCE | Admitting: Internal Medicine

## 2012-01-14 VITALS — BP 116/88 | HR 79 | Temp 97.8°F | Ht 67.0 in | Wt 260.0 lb

## 2012-01-14 DIAGNOSIS — J029 Acute pharyngitis, unspecified: Secondary | ICD-10-CM | POA: Insufficient documentation

## 2012-01-14 DIAGNOSIS — B20 Human immunodeficiency virus [HIV] disease: Secondary | ICD-10-CM

## 2012-01-14 NOTE — Assessment & Plan Note (Addendum)
Likely viral in origin.  Now improving.  Symptomatic care.  RTC as scheduled.  Counselled on proper use of ED.

## 2012-01-14 NOTE — Progress Notes (Signed)
  Subjective:    Patient ID: Kayla Price, female    DOB: [birth year], 56 y.o.   MRN: [id number]  HPI 56 yo with well controlled HIV s/p 2 ED visits for pharyngitis.  Described significant chills and malaise. Started on Augmentin, did not improve, went to ED again and given Azithromycin.  Strep culture negative.  Now here for hospital follow up.  Now improving.     Review of Systems  Constitutional: Positive for fatigue. Negative for fever, chills and unexpected weight change.  HENT: Positive for sore throat, trouble swallowing and voice change. Negative for sinus pressure.   Gastrointestinal: Negative for nausea, abdominal pain and diarrhea.  Neurological: Negative for dizziness and headaches.       Objective:   Physical Exam  Constitutional: She appears well-developed and well-nourished. No distress.  HENT:  Mouth/Throat: Oropharynx is clear and moist. No oropharyngeal exudate.  Cardiovascular: Normal rate, regular rhythm and normal heart sounds.  Exam reveals no gallop and no friction rub.   No murmur heard.         Assessment & Plan:

## 2012-01-14 NOTE — Assessment & Plan Note (Signed)
Due for labs and will do today. Follow up with primary provider.

## 2012-02-13 ENCOUNTER — Other Ambulatory Visit (INDEPENDENT_AMBULATORY_CARE_PROVIDER_SITE_OTHER): Payer: PRIVATE HEALTH INSURANCE

## 2012-02-13 ENCOUNTER — Other Ambulatory Visit: Payer: Self-pay | Admitting: Infectious Disease

## 2012-02-13 DIAGNOSIS — Z113 Encounter for screening for infections with a predominantly sexual mode of transmission: Secondary | ICD-10-CM

## 2012-02-13 DIAGNOSIS — B20 Human immunodeficiency virus [HIV] disease: Secondary | ICD-10-CM

## 2012-02-13 LAB — CBC WITH DIFFERENTIAL/PLATELET
Eosinophils Relative: 4 % (ref 0–5)
HCT: 40.4 % (ref 36.0–46.0)
Lymphocytes Relative: 47 % — ABNORMAL HIGH (ref 12–46)
Lymphs Abs: 2 10*3/uL (ref 0.7–4.0)
MCV: 94 fL (ref 78.0–100.0)
Monocytes Absolute: 0.5 10*3/uL (ref 0.1–1.0)
Monocytes Relative: 12 % (ref 3–12)
RBC: 4.3 MIL/uL (ref 3.87–5.11)
WBC: 4.3 10*3/uL (ref 4.0–10.5)

## 2012-02-13 LAB — COMPLETE METABOLIC PANEL WITH GFR
AST: 52 U/L — ABNORMAL HIGH (ref 0–37)
Alkaline Phosphatase: 89 U/L (ref 39–117)
BUN: 17 mg/dL (ref 6–23)
Creat: 1.26 mg/dL — ABNORMAL HIGH (ref 0.50–1.10)
Potassium: 5.1 mEq/L (ref 3.5–5.3)

## 2012-02-13 LAB — LIPID PANEL
HDL: 51 mg/dL (ref >39)
LDL Cholesterol: 89 mg/dL (ref 0–99)
Total CHOL/HDL Ratio: 3.2 Ratio

## 2012-02-14 LAB — T-HELPER CELL (CD4) - (RCID CLINIC ONLY): CD4 % Helper T Cell: 37 % (ref 33–55)

## 2012-02-14 LAB — HIV-1 RNA QUANT-NO REFLEX-BLD: HIV 1 RNA Quant: <20 copies/mL (ref <20)

## 2012-02-27 ENCOUNTER — Encounter: Payer: Self-pay | Admitting: Infectious Disease

## 2012-02-27 ENCOUNTER — Ambulatory Visit (INDEPENDENT_AMBULATORY_CARE_PROVIDER_SITE_OTHER): Payer: PRIVATE HEALTH INSURANCE | Admitting: Infectious Disease

## 2012-02-27 VITALS — BP 120/89 | HR 70 | Temp 97.6°F | Wt 268.0 lb

## 2012-02-27 DIAGNOSIS — B350 Tinea barbae and tinea capitis: Secondary | ICD-10-CM

## 2012-02-27 DIAGNOSIS — B20 Human immunodeficiency virus [HIV] disease: Secondary | ICD-10-CM

## 2012-02-27 DIAGNOSIS — E890 Postprocedural hypothyroidism: Secondary | ICD-10-CM

## 2012-02-27 DIAGNOSIS — I10 Essential (primary) hypertension: Secondary | ICD-10-CM

## 2012-02-27 MED ORDER — KETOCONAZOLE 2 % EX SHAM: MEDICATED_SHAMPOO | CUTANEOUS | Status: AC

## 2012-02-27 MED ORDER — PNEUMOCOCCAL VAC POLYVALENT 25 MCG/0.5ML IJ INJ: 0.5000 mL | INJECTION | Freq: Once | INTRAMUSCULAR | Status: DC

## 2012-02-27 MED ORDER — TERBINAFINE HCL 250 MG PO TABS: 250.0000 mg | ORAL_TABLET | Freq: Every day | ORAL | Status: DC

## 2012-02-27 NOTE — Assessment & Plan Note (Signed)
Better controlled 

## 2012-02-27 NOTE — Assessment & Plan Note (Signed)
Try systemic Lamisil

## 2012-02-27 NOTE — Assessment & Plan Note (Signed)
Followed by Dr. Ellison. 

## 2012-02-27 NOTE — Progress Notes (Signed)
  Subjective:    Patient ID: Kayla Price, female    DOB: 08-26-1955, 56 y.o.   MRN: [id number]  HPI  Kayla Price is a 56 y.o. female who is doing superbly well on her  antiviral regimen, of Atripla and Isentress with undetectable viral load and health cd4 count. She continues to suffer from apparent tinea capitis with continued hair loss. Will try to see if it for consult and help her with this. I've encouraged her to establish care with a primary care physician. She is being seen by Dr. Everardo All from endocrinology and perhaps Dr. Everardo All might be controlling her primary care physician or perhaps there might be another physician in the [facility] he might be able to do this. I spent greater than 45 minutes with the patient including greater than 50% of time in face to face counsel of the patient and in coordination of their care.    Review of Systems  Constitutional: Negative for fever, chills, diaphoresis, activity change, appetite change, fatigue and unexpected weight change.  HENT: Negative for congestion, sore throat, rhinorrhea, sneezing, trouble swallowing and sinus pressure.   Eyes: Negative for photophobia and visual disturbance.  Respiratory: Negative for cough, chest tightness, shortness of breath, wheezing and stridor.   Cardiovascular: Negative for chest pain, palpitations and leg swelling.  Gastrointestinal: Negative for nausea, vomiting, abdominal pain, diarrhea, constipation, blood in stool, abdominal distention and anal bleeding.  Genitourinary: Negative for dysuria, hematuria, flank pain and difficulty urinating.  Musculoskeletal: Negative for myalgias, back pain, joint swelling, arthralgias and gait problem.  Skin: Positive for color change. Negative for pallor, rash and wound.  Neurological: Negative for dizziness, tremors, weakness and light-headedness.  Hematological: Negative for adenopathy. Does not bruise/bleed easily.  Psychiatric/Behavioral: Negative for  behavioral problems, confusion, disturbed wake/sleep cycle, dysphoric mood, decreased concentration and agitation.       Objective:   Physical Exam  Constitutional: She is oriented to person, place, and time. She appears well-developed and well-nourished. No distress.  HENT:  Head: Normocephalic and atraumatic.    Mouth/Throat: Oropharynx is clear and moist. No oropharyngeal exudate.  Eyes: Conjunctivae and EOM are normal. Pupils are equal, round, and reactive to light. No scleral icterus.  Neck: Normal range of motion. Neck supple. No JVD present.  Cardiovascular: Normal rate, regular rhythm and normal heart sounds.  Exam reveals no gallop and no friction rub.   No murmur heard. Pulmonary/Chest: Effort normal and breath sounds normal. No respiratory distress. She has no wheezes. She has no rales. She exhibits no tenderness.  Abdominal: She exhibits no distension and no mass. There is no tenderness. There is no rebound and no guarding.  Musculoskeletal: She exhibits no edema and no tenderness.  Lymphadenopathy:    She has no cervical adenopathy.  Neurological: She is alert and oriented to person, place, and time. She has normal reflexes. She exhibits normal muscle tone. Coordination normal.  Skin: Skin is warm and dry. She is not diaphoretic. No erythema. No pallor.  Psychiatric: She has a normal mood and affect. Her behavior is normal. Judgment and thought content normal.          Assessment & Plan:  HIV DISEASE Superbly controlled. I wonder whether she truly does need Isentress in addition to the Atripla will be looked back through her prior genotypes  TINEA CAPITIS Try systemic Lamisil  HYPERTENSION Better controlled  HYPOTHYROIDISM, POST-RADIATION Followed by Dr. Everardo All

## 2012-02-27 NOTE — Assessment & Plan Note (Signed)
Superbly controlled. I wonder whether she truly does need Isentress in addition to the Atripla will be looked back through her prior genotypes

## 2012-03-05 ENCOUNTER — Ambulatory Visit (INDEPENDENT_AMBULATORY_CARE_PROVIDER_SITE_OTHER): Payer: PRIVATE HEALTH INSURANCE

## 2012-03-05 DIAGNOSIS — Z23 Encounter for immunization: Secondary | ICD-10-CM

## 2012-03-21 ENCOUNTER — Other Ambulatory Visit: Payer: Self-pay | Admitting: Infectious Disease

## 2012-03-21 DIAGNOSIS — Z853 Personal history of malignant neoplasm of breast: Secondary | ICD-10-CM

## 2012-04-13 ENCOUNTER — Other Ambulatory Visit: Payer: Self-pay | Admitting: Endocrinology

## 2012-04-28 ENCOUNTER — Ambulatory Visit
Admission: RE | Admit: 2012-04-28 | Discharge: 2012-04-28 | Disposition: A | Discharge status: Home / Self Care | Payer: PRIVATE HEALTH INSURANCE | Source: Ambulatory Visit | Attending: Infectious Disease | Admitting: Infectious Disease

## 2012-04-28 DIAGNOSIS — Z853 Personal history of malignant neoplasm of breast: Secondary | ICD-10-CM

## 2012-06-28 ENCOUNTER — Other Ambulatory Visit: Payer: Self-pay | Admitting: Infectious Disease

## 2012-07-11 ENCOUNTER — Other Ambulatory Visit: Payer: Self-pay | Admitting: Endocrinology

## 2012-07-18 ENCOUNTER — Encounter: Payer: Self-pay | Admitting: Endocrinology

## 2012-07-18 ENCOUNTER — Telehealth: Payer: Self-pay | Admitting: Endocrinology

## 2012-07-18 ENCOUNTER — Ambulatory Visit (INDEPENDENT_AMBULATORY_CARE_PROVIDER_SITE_OTHER): Payer: PRIVATE HEALTH INSURANCE | Admitting: Endocrinology

## 2012-07-18 VITALS — BP 130/80 | HR 90 | Wt 265.0 lb

## 2012-07-18 DIAGNOSIS — E890 Postprocedural hypothyroidism: Secondary | ICD-10-CM

## 2012-07-18 NOTE — Patient Instructions (Addendum)
please call [phone] ([facility] physician referral line), to get an appointment with a primary doctor. blood tests are being requested for you today.  We'll contact you with results.  I would be happy to see you back here whenever you want.

## 2012-07-18 NOTE — Telephone Encounter (Signed)
The patient called because her Levthyroxine 150 mg rx was denied by our office.  The patient is now scheduled to come in today at 2 pm, and she is wondering if she is able to get the medication refilled at this time.  The patient states she has been out of medication since Wednesday 07/16/12.

## 2012-07-18 NOTE — Progress Notes (Signed)
Subjective:    Patient ID: Kayla Price, female    DOB: 03/30/1956, 57 y.o.   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  [facility] shows me as PCP, but i am not, so she will need a new PCP.   Past Medical History  Diagnosis Date  . HIV DISEASE 03/27/2006  . 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 Unspecified vitamin D deficiency 08/06/2007  . HYPERLIPIDEMIA, MIXED 12/15/2007  . HYPERTENSION 03/27/2006  . PVD 04/07/2007  . Alopecia areata 11/28/2009  . MENORRHAGIA, POSTMENOPAUSAL 02/03/2009  . TRIGGER FINGER 05/06/2008  . HIP FRACTURE, RIGHT 05/06/2008  . BREAST CANCER, HX OF 03/27/2006  . OSTEOARTHROSIS, LOCAL, SCND, UNSPC SITE 04/07/2007  . Fasting hyperglycemia   . Tinea capitis   . HIV (human immunodeficiency virus infection)     Past Surgical History  Procedure Date  . Tubal ligation   . Breast lumpectomy   . Lymph node dissection 2005  . Port-a-cath removal 2006    insertion 2005    History   Social History  . Marital Status: Widowed    Spouse Name: N/A    Number of Children: N/A  . Years of Education: N/A   Occupational History  . Paramedic    Social History Main Topics  . Smoking status: Never Smoker   . Smokeless tobacco: Never Used  . Alcohol Use: No  . Drug Use: No  . Sexually Active: No     Comment: delcined condoms   Other Topics Concern  . Not on file   Social History Narrative   single    Current Outpatient Prescriptions on File Prior to Visit  Medication Sig Dispense Refill  . ALBUTEROL SULFATE HFA IN Inhale 1 puff into the lungs every 6 (six) hours.      Marland Kitchen atorvastatin (LIPITOR) 10 MG tablet Take 10 mg by mouth daily.      Marland Kitchen BENICAR HCT 40-25 MG per tablet take 1 tablet by mouth once daily  30 each  5  . camphor-menthol (SARNA) lotion Apply 1 application topically as needed.      Marland Kitchen efavirenz-emtricitabine-tenofovir (ATRIPLA) 600-200-300 MG per tablet  Take 1 tablet by mouth at bedtime.      . ISENTRESS 400 MG tablet TAKE 1 TABLET (400 MG TOTAL) BY MOUTH 2 (TWO) TIMES DAILY.  60 tablet  6  . levothyroxine (SYNTHROID, LEVOTHROID) 150 MCG tablet Take 150 mcg by mouth daily.      Marland Kitchen levothyroxine (SYNTHROID, LEVOTHROID) 150 MCG tablet take 1 tablet by mouth once daily  30 tablet  5  . Multiple Vitamins-Minerals (WOMENS MULTI VITAMIN & MINERAL PO) Take 1 tablet by mouth daily.        . naproxen sodium (ANAPROX) 220 MG tablet Take 220 mg by mouth as needed. pain      . olmesartan-hydrochlorothiazide (BENICAR HCT) 40-25 MG per tablet Take 1 tablet by mouth daily.      Marland Kitchen pyridOXINE (VITAMIN B-6) 100 MG tablet Take 100 mg by mouth daily.      . raltegravir (ISENTRESS) 400 MG tablet Take 400 mg by mouth 2 (two) times daily.      Marland Kitchen terbinafine (LAMISIL) 250 MG tablet Take 1 tablet (250 mg total) by mouth daily.  30 tablet  2  . vitamin C (ASCORBIC ACID) 250 MG tablet Take 250 mg by mouth daily.       Current Facility-Administered Medications on  File Prior to Visit  Medication Dose Route Frequency Provider Last Rate Last Dose  . influenza (>/= 3 years) inactive virus vaccine (FLUZONE/FLUVIRIN) injection 0.5 mL  0.5 mL Intramuscular Once Randall Hiss, MD      . pneumococcal 23 valent vaccine (PNU-IMMUNE) injection 0.5 mL  0.5 mL Intramuscular Once Randall Hiss, MD        Allergies  Allergen Reactions  . Lisinopril Swelling    Swelling of tongue and mouth    Family History  Problem Relation Age of Onset  . Heart attack Brother     Massive MI in 75s  . Heart disease Brother     CAD  . Diabetes Mother   . Diabetes Sister   . Thyroid disease Sister     had I-131 rx of hyperthyroidism  . Diabetes Sister     BP 130/80  Pulse 90  Wt 265 lb (120.203 kg)  SpO2 97%  Review of Systems Denies weight change.    Objective:   Physical Exam VITAL SIGNS:  See vs page. GENERAL: no distress. NECK: There is no palpable thyroid  enlargement.  No thyroid nodule is palpable.  No palpable lymphadenopathy at the anterior neck.    TSH=4    Assessment & Plan:  Post-i-131 hypothyroidism, well-replaced

## 2012-07-21 ENCOUNTER — Other Ambulatory Visit: Payer: Self-pay | Admitting: Endocrinology

## 2012-08-01 ENCOUNTER — Other Ambulatory Visit: Payer: Self-pay | Admitting: *Deleted

## 2012-08-01 DIAGNOSIS — B20 Human immunodeficiency virus [HIV] disease: Secondary | ICD-10-CM

## 2012-08-01 MED ORDER — EFAVIRENZ-EMTRICITAB-TENOFOVIR 600-200-300 MG PO TABS: 1.0000 | ORAL_TABLET | Freq: Every day | ORAL | Status: DC

## 2012-08-13 ENCOUNTER — Other Ambulatory Visit: Payer: Self-pay | Admitting: Infectious Disease

## 2012-08-13 ENCOUNTER — Other Ambulatory Visit (INDEPENDENT_AMBULATORY_CARE_PROVIDER_SITE_OTHER): Payer: PRIVATE HEALTH INSURANCE

## 2012-08-13 DIAGNOSIS — B20 Human immunodeficiency virus [HIV] disease: Secondary | ICD-10-CM

## 2012-08-13 DIAGNOSIS — Z79899 Other long term (current) drug therapy: Secondary | ICD-10-CM

## 2012-08-13 DIAGNOSIS — Z113 Encounter for screening for infections with a predominantly sexual mode of transmission: Secondary | ICD-10-CM

## 2012-08-13 LAB — COMPLETE METABOLIC PANEL WITH GFR
ALT: 41 U/L — ABNORMAL HIGH (ref 0–35)
AST: 37 U/L (ref 0–37)
Alkaline Phosphatase: 86 U/L (ref 39–117)
CO2: 30 mEq/L (ref 19–32)
Creat: 1.14 mg/dL — ABNORMAL HIGH (ref 0.50–1.10)
GFR, Est African American: 62 mL/min
Sodium: 144 mEq/L (ref 135–145)
Total Bilirubin: 0.4 mg/dL (ref 0.3–1.2)
Total Protein: 7.7 g/dL (ref 6.0–8.3)

## 2012-08-13 LAB — CBC WITH DIFFERENTIAL/PLATELET
Eosinophils Absolute: 0.1 10*3/uL (ref 0.0–0.7)
HCT: 42.2 % (ref 36.0–46.0)
Hemoglobin: 14.9 g/dL (ref 12.0–15.0)
Lymphs Abs: 2.3 10*3/uL (ref 0.7–4.0)
MCH: 33.5 pg (ref 26.0–34.0)
Monocytes Absolute: 0.4 10*3/uL (ref 0.1–1.0)
Monocytes Relative: 8 % (ref 3–12)
Neutrophils Relative %: 47 % (ref 43–77)
RBC: 4.45 MIL/uL (ref 3.87–5.11)

## 2012-08-14 LAB — RPR

## 2012-08-14 LAB — HIV-1 RNA QUANT-NO REFLEX-BLD
HIV 1 RNA Quant: <20 copies/mL (ref <20)
HIV-1 RNA Quant, Log: <1.3 {Log} (ref <1.30)

## 2012-08-14 LAB — T-HELPER CELL (CD4) - (RCID CLINIC ONLY): CD4 T Cell Abs: 880 uL (ref 400–2700)

## 2012-08-27 ENCOUNTER — Ambulatory Visit: Payer: PRIVATE HEALTH INSURANCE | Admitting: Infectious Disease

## 2012-09-04 ENCOUNTER — Encounter: Payer: Self-pay | Admitting: Infectious Disease

## 2012-09-04 ENCOUNTER — Ambulatory Visit (INDEPENDENT_AMBULATORY_CARE_PROVIDER_SITE_OTHER): Payer: PRIVATE HEALTH INSURANCE | Admitting: Infectious Disease

## 2012-09-04 VITALS — BP 141/93 | HR 72 | Temp 97.2°F | Wt 273.0 lb

## 2012-09-04 DIAGNOSIS — B20 Human immunodeficiency virus [HIV] disease: Secondary | ICD-10-CM

## 2012-09-04 NOTE — Progress Notes (Signed)
  Subjective:    Patient ID: Kayla Price, female    DOB: 08-03-1955, 57 y.o.   MRN: [id number]  HPI   Kayla Price is a 57 y.o. female who is doing superbly well on her  antiviral regimen, of Atripla and Isentress with undetectable viral load and health cd4 count.  she is being seen by Dr. 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  HENT: Negative for congestion, sore throat, rhinorrhea, sneezing, trouble swallowing and sinus pressure.   Eyes: Negative for photophobia and visual disturbance.  Respiratory: Negative for cough, chest tightness, shortness of breath, wheezing and stridor.   Cardiovascular: Negative for chest pain, palpitations and leg swelling.  Gastrointestinal: Negative for nausea, vomiting, abdominal pain, diarrhea, constipation, blood in stool, abdominal distention and anal bleeding.  Genitourinary: Negative for dysuria, hematuria, flank pain and difficulty urinating.  Musculoskeletal: Negative for myalgias, back pain, joint swelling, arthralgias and gait problem.  Skin: Positive for color change. Negative for pallor, rash and wound.  Neurological: Negative for dizziness, tremors, weakness and light-headedness.  Hematological: Negative for adenopathy. Does not bruise/bleed easily.  Psychiatric/Behavioral: Negative for behavioral problems, confusion, sleep disturbance, dysphoric mood, decreased concentration and agitation.       Objective:   Physical Exam  Constitutional:  She is oriented to person, place, and time. She appears well-developed and well-nourished. No distress.  HENT:  Head: Normocephalic and atraumatic.    Mouth/Throat: Oropharynx is clear and moist. No oropharyngeal exudate.  Eyes: Conjunctivae and EOM are normal. Pupils are equal, round, and reactive to light. No scleral icterus.  Neck: Normal range of motion. Neck supple. No JVD present.  Cardiovascular: Normal rate, regular rhythm and normal heart sounds.  Exam reveals no gallop and no friction rub.   No murmur heard. Pulmonary/Chest: Effort normal and breath sounds normal. No respiratory distress. She has no wheezes. She has no rales. She exhibits no tenderness.  Abdominal: She exhibits no distension and no mass. There is no tenderness. There is no rebound and no guarding.  Musculoskeletal: She exhibits no edema and no tenderness.  Lymphadenopathy:    She has no cervical adenopathy.  Neurological: She is alert and oriented to person, place, and time. She has normal reflexes. She exhibits normal muscle tone. Coordination normal.  Skin: Skin is warm and dry. She is not diaphoretic. No erythema. No pallor.  Psychiatric: She has a normal mood and affect. Her behavior is normal. Judgment and thought content normal.          Assessment & Plan:  HIV DISEASE  Superbly controlled. Continue atripla and isentress   HYPERTENSION  Better controlled   HYPOTHYROIDISM, POST-RADIATION  Followed by Dr. Everardo All, TSH 4.56

## 2012-09-20 ENCOUNTER — Other Ambulatory Visit: Payer: Self-pay | Admitting: Endocrinology

## 2012-09-22 ENCOUNTER — Other Ambulatory Visit: Payer: Self-pay | Admitting: *Deleted

## 2012-09-23 ENCOUNTER — Other Ambulatory Visit: Payer: Self-pay | Admitting: *Deleted

## 2012-10-20 ENCOUNTER — Other Ambulatory Visit: Payer: Self-pay

## 2012-10-20 MED ORDER — OLMESARTAN MEDOXOMIL-HCTZ 40-25 MG PO TABS: ORAL_TABLET | ORAL | Status: DC

## 2012-12-02 ENCOUNTER — Telehealth: Payer: Self-pay | Admitting: Infectious Disease

## 2012-12-02 ENCOUNTER — Encounter (HOSPITAL_COMMUNITY): Payer: Self-pay | Admitting: Emergency Medicine

## 2012-12-02 ENCOUNTER — Emergency Department (HOSPITAL_COMMUNITY)
Admission: EM | Admit: 2012-12-02 | Discharge: 2012-12-02 | Disposition: A | Discharge status: Home / Self Care | Payer: 59 | Attending: Emergency Medicine | Admitting: Emergency Medicine

## 2012-12-02 ENCOUNTER — Telehealth: Payer: Self-pay | Admitting: Licensed Clinical Social Worker

## 2012-12-02 DIAGNOSIS — Z8742 Personal history of other diseases of the female genital tract: Secondary | ICD-10-CM | POA: Insufficient documentation

## 2012-12-02 DIAGNOSIS — Z872 Personal history of diseases of the skin and subcutaneous tissue: Secondary | ICD-10-CM | POA: Insufficient documentation

## 2012-12-02 DIAGNOSIS — Z853 Personal history of malignant neoplasm of breast: Secondary | ICD-10-CM | POA: Insufficient documentation

## 2012-12-02 DIAGNOSIS — H9209 Otalgia, unspecified ear: Secondary | ICD-10-CM | POA: Insufficient documentation

## 2012-12-02 DIAGNOSIS — Z21 Asymptomatic human immunodeficiency virus [HIV] infection status: Secondary | ICD-10-CM | POA: Insufficient documentation

## 2012-12-02 DIAGNOSIS — G510 Bell's palsy: Secondary | ICD-10-CM | POA: Insufficient documentation

## 2012-12-02 DIAGNOSIS — Z79899 Other long term (current) drug therapy: Secondary | ICD-10-CM | POA: Insufficient documentation

## 2012-12-02 DIAGNOSIS — Z8781 Personal history of (healed) traumatic fracture: Secondary | ICD-10-CM | POA: Insufficient documentation

## 2012-12-02 DIAGNOSIS — M1990 Unspecified osteoarthritis, unspecified site: Secondary | ICD-10-CM | POA: Insufficient documentation

## 2012-12-02 DIAGNOSIS — I10 Essential (primary) hypertension: Secondary | ICD-10-CM | POA: Insufficient documentation

## 2012-12-02 DIAGNOSIS — Z862 Personal history of diseases of the blood and blood-forming organs and certain disorders involving the immune mechanism: Secondary | ICD-10-CM | POA: Insufficient documentation

## 2012-12-02 DIAGNOSIS — E039 Hypothyroidism, unspecified: Secondary | ICD-10-CM | POA: Insufficient documentation

## 2012-12-02 DIAGNOSIS — Z8639 Personal history of other endocrine, nutritional and metabolic disease: Secondary | ICD-10-CM | POA: Insufficient documentation

## 2012-12-02 DIAGNOSIS — Z8679 Personal history of other diseases of the circulatory system: Secondary | ICD-10-CM | POA: Insufficient documentation

## 2012-12-02 DIAGNOSIS — Z8619 Personal history of other infectious and parasitic diseases: Secondary | ICD-10-CM | POA: Insufficient documentation

## 2012-12-02 MED ORDER — VALACYCLOVIR HCL 1 G PO TABS: 1000.0000 mg | ORAL_TABLET | Freq: Three times a day (TID) | ORAL | Status: DC

## 2012-12-02 MED ORDER — PREDNISONE 20 MG PO TABS: 20.0000 mg | ORAL_TABLET | Freq: Every day | ORAL | Status: DC

## 2012-12-02 MED ORDER — DRY EYES OP: 2.0000 [drp] | Freq: Four times a day (QID) | OPHTHALMIC | Status: DC | PRN

## 2012-12-02 NOTE — ED Provider Notes (Signed)
History     CSN: [id number] Arrival date & time 12/02/12  1036 None     Chief Complaint  Patient presents with  . Facial Droop   HPI  Patient with history HIV (well controlled per last ID note), Hypertension, hyperlipidemia, hypothyroidism presents with facial droop of entire right side of her face for approximately 48 hours.   Patient states that she was feeling some tingling in her right ear after being in a swimming pool and being splashed some in her right ear but not submerging. Saturday this changed to an intermittent stabbing mild pain in her earlobe. On Sunday morning, she noted that her right eye felt dry. She was concerned about sjogrens which she had been tested for in the past and which was negative. When she looked in the mirror, she noted droop of her lip and that when she talked, the right side of her lip minimally moved. Patient denies focal weakness in extremities, dysarthria, diplopia, difficulty swallowing or vertigo. She did feel some tingling of her right lip on Sunday morning but other than that denies facial numbness. Denies recent cold or illness. Patient states she cannot close her eye and cannot wiggle her eyebrow. Patient also with altered taste on tip of her tongue.   Past Medical History  Diagnosis Date  . HIV DISEASE 03/27/2006  . 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 Unspecified vitamin D deficiency 08/06/2007  . HYPERLIPIDEMIA, MIXED 12/15/2007  . HYPERTENSION 03/27/2006  . PVD 04/07/2007  . Alopecia areata 11/28/2009  . MENORRHAGIA, POSTMENOPAUSAL 02/03/2009  . TRIGGER FINGER 05/06/2008  . HIP FRACTURE, RIGHT 05/06/2008  . BREAST CANCER, HX OF 03/27/2006  . OSTEOARTHROSIS, LOCAL, SCND, UNSPC SITE 04/07/2007  . Fasting hyperglycemia   . Tinea capitis   . HIV (human immunodeficiency virus infection)     Past Surgical History  Procedure Laterality Date  . Tubal ligation    . Breast lumpectomy    . Lymph node dissection  2005  . Port-a-cath removal   2006    insertion 2005    Family History  Problem Relation Age of Onset  . Heart attack Brother     Massive MI in 28s  . Heart disease Brother     CAD  . Diabetes Mother   . Diabetes Sister   . Thyroid disease Sister     had I-131 rx of hyperthyroidism  . Diabetes Sister     History  Substance Use Topics  . Smoking status: Never Smoker   . Smokeless tobacco: Never Used  . Alcohol Use: No    OB History   Grav Para Term Preterm Abortions TAB SAB Ect Mult Living                  Review of Systems A full 10 point review of symptoms was performed and was negative except as noted in HPI.   Allergies  Lisinopril  Home Medications   Current Outpatient Rx  Name  Route  Sig  Dispense  Refill  . ALBUTEROL SULFATE HFA IN   Inhalation   Inhale 1 puff into the lungs every 6 (six) hours.         Marland Kitchen EXPIRED: atorvastatin (LIPITOR) 10 MG tablet   Oral   Take 10 mg by mouth daily.         Marland Kitchen efavirenz-emtricitabine-tenofovir (ATRIPLA) 600-200-300 MG per tablet   Oral   Take 1 tablet by mouth at bedtime.   30 tablet   11   . ISENTRESS  400 MG tablet      TAKE 1 TABLET (400 MG TOTAL) BY MOUTH 2 (TWO) TIMES DAILY.   60 tablet   6   . EXPIRED: levothyroxine (SYNTHROID, LEVOTHROID) 150 MCG tablet   Oral   Take 150 mcg by mouth daily.         Marland Kitchen levothyroxine (SYNTHROID, LEVOTHROID) 150 MCG tablet      take 1 tablet by mouth once daily   30 tablet   5   . Multiple Vitamins-Minerals (WOMENS MULTI VITAMIN & MINERAL PO)   Oral   Take 1 tablet by mouth daily.           . naproxen sodium (ANAPROX) 220 MG tablet   Oral   Take 220 mg by mouth as needed. pain         . EXPIRED: olmesartan-hydrochlorothiazide (BENICAR HCT) 40-25 MG per tablet   Oral   Take 1 tablet by mouth daily.         Marland Kitchen olmesartan-hydrochlorothiazide (BENICAR HCT) 40-25 MG per tablet      take 1 tablet by mouth once daily   30 tablet   3   . pyridOXINE (VITAMIN B-6) 100 MG tablet    Oral   Take 100 mg by mouth daily.         . raltegravir (ISENTRESS) 400 MG tablet   Oral   Take 400 mg by mouth 2 (two) times daily.         Marland Kitchen terbinafine (LAMISIL) 250 MG tablet   Oral   Take 1 tablet (250 mg total) by mouth daily.   30 tablet   2   . vitamin C (ASCORBIC ACID) 250 MG tablet   Oral   Take 250 mg by mouth daily.           BP 152/107  Pulse 79  Temp(Src) 97.9 F (36.6 C) (Oral)  Resp 16  SpO2 100%  Physical Exam  Constitutional: She is oriented to person, place, and time. She appears well-developed and well-nourished. No distress.  HENT:  Mouth/Throat: Oropharynx is clear and moist. No oropharyngeal exudate.  Right external ear canal with mild erythema (patient admits to using q tips for discomfort). TM normal bilaterally with no signs AOM.   Eyes: EOM are normal. Pupils are equal, round, and reactive to light.  Neck: Normal range of motion. Neck supple.  Cardiovascular: Normal rate and regular rhythm.  Exam reveals no gallop and no friction rub.   No murmur heard. Pulmonary/Chest: Effort normal and breath sounds normal. She has no wheezes. She has no rales.  Abdominal: Soft. Bowel sounds are normal. There is no tenderness. There is no rebound and no guarding.  Musculoskeletal: Normal range of motion. She exhibits no edema.  Neurological: She is alert and oriented to person, place, and time. She has normal reflexes. A cranial nerve deficit (right sided facial droop, only slight movement right eyebrow. Majority of time, unable to close right eye. Otherwise CN II-XII intact with exception of  facial nerve. ) is present. She exhibits normal muscle tone. Coordination normal.  5/5 muscle strength in upper and lower extremities.     ED Course  Procedures (including critical care time)  Labs Reviewed - No data to display No results found.  1. Bell's palsy    MDM  57 year old with history HIV (well controlled per last ID note), Hypertension,  hyperlipidemia, hypothyroidism presents with facial droop of entire right side of her face for approximately 48 hours  consistent with Bell's Palsy. Except for facial nerve. patient with nonfocal neurological exam and no symptoms such as blurry vision, diplopia, dysarthria, focal weakness in other areas to suggest central cause.   Rx for prednisone x 10 days. Valtrex not given as evidence not clear as to benefit.  Patient to call ID doctor after visit to make sure they are ok with treatment and see if she should be seen sooner by them as a result. Discussed length of illness and covering right eye with glasses in daytime and patch when she sleeps. Also advised eye drops .   Shelva Majestic, MD 12/02/12 [phone]

## 2012-12-02 NOTE — Telephone Encounter (Signed)
Patient was in the ED today and diagnosed with Bell's Palsy she was given steroids and eye drops for her dry eye. She wants to know if she needs  to f/u  before her scheduled  visit in September?

## 2012-12-02 NOTE — ED Notes (Signed)
Place a pink arm band on right arm

## 2012-12-02 NOTE — Telephone Encounter (Signed)
Kayla Price can we schedule Heena with one of the clinic docs tomorrow or Thursday in one of the vacant spots we have?  I have sent in script for valtrex, asked her to NOT take her alleve and to hold her benicar as well for now while she is on high dose valtrex

## 2012-12-02 NOTE — ED Notes (Signed)
Pt reports ear pain on the right side since Friday. Pt has a tried OTC medications with little relief. Pt reports 2days ago, woke up and lip was numb. Pt with right sided facial droop as well as right eye weakness at present, pt reports started 2 days ago as well. Pt alert, oriented x4, speech clear.

## 2012-12-02 NOTE — ED Notes (Signed)
Pt given eye patch & eye pad dressing, pt verbalizes understanding & demonstrated how to apply eye pad

## 2012-12-02 NOTE — Telephone Encounter (Signed)
Patient coming in Thursday with Dr. Luciana Axe

## 2012-12-02 NOTE — Telephone Encounter (Signed)
She could also take valtrex 1g three times a day for next 10 days IF she can maintain good hydration

## 2012-12-02 NOTE — Telephone Encounter (Signed)
Ok, thanks.

## 2012-12-02 NOTE — ED Provider Notes (Signed)
I saw and evaluated the patient, reviewed the resident's note and I agree with the findings and plan.   .Face to face Exam:  General:  Awake HEENT:  Atraumatic Resp:  Normal effort Abd:  Nondistended Neuro: Right-sided facial droop    Nelia Shi, MD 12/02/12 1305

## 2012-12-03 NOTE — Telephone Encounter (Signed)
Perfect

## 2012-12-04 ENCOUNTER — Ambulatory Visit (INDEPENDENT_AMBULATORY_CARE_PROVIDER_SITE_OTHER): Payer: PRIVATE HEALTH INSURANCE | Admitting: Internal Medicine

## 2012-12-04 ENCOUNTER — Encounter: Payer: Self-pay | Admitting: Internal Medicine

## 2012-12-04 VITALS — BP 158/101 | HR 76 | Temp 97.6°F

## 2012-12-04 DIAGNOSIS — G510 Bell's palsy: Secondary | ICD-10-CM

## 2012-12-04 DIAGNOSIS — I10 Essential (primary) hypertension: Secondary | ICD-10-CM

## 2012-12-04 NOTE — Assessment & Plan Note (Addendum)
Unknown etiology though may be due to her recent swimmers ear. This is now stable. She is having nausea and vomiting I suspect from the Valtrex so I have told her to stop this. She also stopped her blood pressure medication due to concern with her kidneys however her blood pressures elevated so she will restart.

## 2012-12-04 NOTE — Assessment & Plan Note (Signed)
Her blood pressure is elevated without the medication. I will have her restart her home medication. She is vomiting but I suspect this will pass so she will be able to keep hydrated despite having hydrochlorothiazide. She is going to call tomorrow if she continues to have some dehydration or nausea and vomiting to be reassessed but otherwise will followup at her usual appointment.

## 2012-12-04 NOTE — Progress Notes (Signed)
  Subjective:    Patient ID: Kayla Price, female    DOB: Jan 06, 1956, 57 y.o.   MRN: [id number]  HPI She comes in here for emergency room followup for Bell's palsy. She was seen in the emergency room 2 days ago diagnosed with a left-sided Bell's palsy thought to be due to swimmer's ear. She was started on prednisone. She has had no significant difficulty swallowing, no recent cold sore outbreaks. She does have some watering in her eyes but otherwise no significant problems with Bell's palsy. She has not been in a Lyme endemic area. No fever. She was then subsequently started on Valtrex and had her blood pressure medicine changed however after taking 2 doses of Valtrex, developed significant nausea and vomiting. Her blood pressure also was significantly elevated in the 150s over 100s. She is having headache as well.   Review of Systems  Constitutional: Negative for fever.  HENT: Positive for ear pain.   Eyes: Negative for visual disturbance.  Gastrointestinal: Negative for nausea and vomiting.  Neurological:       Left facial droop       Objective:   Physical Exam  Constitutional:  Mild distress due to nausea and vomiting  HENT:  Left facial droop consistent with Bell's palsy  Eyes:  Left eye with some watering  Cardiovascular: Normal rate, regular rhythm and normal heart sounds.   Pulmonary/Chest: Effort normal and breath sounds normal. No respiratory distress. She has no wheezes.  Lymphadenopathy:    She has no cervical adenopathy.          Assessment & Plan:

## 2013-01-13 ENCOUNTER — Telehealth: Payer: Self-pay | Admitting: Endocrinology

## 2013-01-13 MED ORDER — OLMESARTAN MEDOXOMIL-HCTZ 40-25 MG PO TABS: 1.0000 | ORAL_TABLET | Freq: Every day | ORAL | Status: DC

## 2013-01-13 MED ORDER — LEVOTHYROXINE SODIUM 150 MCG PO TABS: 150.0000 ug | ORAL_TABLET | Freq: Every day | ORAL | Status: DC

## 2013-01-13 NOTE — Telephone Encounter (Signed)
Rx sent in to pharmacy. 

## 2013-01-26 ENCOUNTER — Ambulatory Visit (INDEPENDENT_AMBULATORY_CARE_PROVIDER_SITE_OTHER): Payer: PRIVATE HEALTH INSURANCE | Admitting: *Deleted

## 2013-01-26 DIAGNOSIS — N840 Polyp of corpus uteri: Secondary | ICD-10-CM

## 2013-01-26 DIAGNOSIS — Z124 Encounter for screening for malignant neoplasm of cervix: Secondary | ICD-10-CM

## 2013-01-26 NOTE — Progress Notes (Signed)
  Subjective:     Kayla Price is a 57 y.o. woman who comes in today for a  pap smear only.  Seen by Dr Penne Lash for endometrial polyp.  Previous abnormal Pap smears: yes, ASCUS. Contraception: condoms  Objective:    There were no vitals taken for this visit. Pelvic Exam: Pap smear obtained.   Assessment:    Screening pap smear.   Plan:    Follow up in one year, or as indicated by Pap results.  Pt given educational materials re: HIV and diet, nutrition, exercise, BSE, health promotion, self-esteem, partner protection and PAP smears. Given condoms.

## 2013-01-26 NOTE — Patient Instructions (Signed)
  Your results will be ready in about a week.  You may look them up on MyChart or I will mail you a letter.  Thank you for coming to the Center for your care.  Angelique Blonder, RN

## 2013-01-29 ENCOUNTER — Encounter: Payer: Self-pay | Admitting: *Deleted

## 2013-02-05 ENCOUNTER — Encounter: Payer: Self-pay | Admitting: Endocrinology

## 2013-02-05 ENCOUNTER — Ambulatory Visit (INDEPENDENT_AMBULATORY_CARE_PROVIDER_SITE_OTHER): Payer: PRIVATE HEALTH INSURANCE | Admitting: Endocrinology

## 2013-02-05 VITALS — BP 122/80 | HR 70 | Ht 67.0 in | Wt 282.0 lb

## 2013-02-05 DIAGNOSIS — E890 Postprocedural hypothyroidism: Secondary | ICD-10-CM

## 2013-02-05 LAB — TSH: TSH: 2.32 u[IU]/mL (ref 0.35–5.50)

## 2013-02-05 NOTE — Patient Instructions (Addendum)
blood tests are being requested for you today.  We'll contact you with results. I would be happy to see you back here whenever you want.   

## 2013-02-05 NOTE — Progress Notes (Signed)
Subjective:    Patient ID: Kayla Price, female    DOB: 07-Jul-1955, 57 y.o.   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  pt states she feels well in general, except for recent bell's palsy.   Past Medical History  Diagnosis Date  . HIV DISEASE 03/27/2006  . 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 Unspecified vitamin D deficiency 08/06/2007  . HYPERLIPIDEMIA, MIXED 12/15/2007  . HYPERTENSION 03/27/2006  . PVD 04/07/2007  . Alopecia areata 11/28/2009  . MENORRHAGIA, POSTMENOPAUSAL 02/03/2009  . TRIGGER FINGER 05/06/2008  . HIP FRACTURE, RIGHT 05/06/2008  . BREAST CANCER, HX OF 03/27/2006  . OSTEOARTHROSIS, LOCAL, SCND, UNSPC SITE 04/07/2007  . Fasting hyperglycemia   . Tinea capitis   . HIV (human immunodeficiency virus infection)     Past Surgical History  Procedure Laterality Date  . Tubal ligation    . Breast lumpectomy    . Lymph node dissection  2005  . Port-a-cath removal  2006    insertion 2005    History   Social History  . Marital Status: Widowed    Spouse Name: N/A    Number of Children: N/A  . Years of Education: N/A   Occupational History  . Paramedic    Social History Main Topics  . Smoking status: Never Smoker   . Smokeless tobacco: Never Used  . Alcohol Use: No  . Drug Use: No  . Sexual Activity: Not Currently     Comment: delcined condoms   Other Topics Concern  . Not on file   Social History Narrative   single    Current Outpatient Prescriptions on File Prior to Visit  Medication Sig Dispense Refill  . Artificial Tear Ointment (DRY EYES OP) Apply 2 drops to eye 4 (four) times daily as needed (dry eyes).  1 Container  2  . B Complex-C (SUPER B COMPLEX PO) Take 1 tablet by mouth daily.      Josefa Half Peroxide (EAR DROPS OT) Place 2 drops into the right ear daily as needed (swimmers ear).      . Chromium Picolinate 800 MCG TABS Take 800 mcg by mouth  daily.      Marland Kitchen efavirenz-emtricitabine-tenofovir (ATRIPLA) 600-200-300 MG per tablet Take 1 tablet by mouth at bedtime.  30 tablet  11  . Hyaluronic Acid-Vitamin C (HYALURONIC ACID PO) Take 1 tablet by mouth daily.      Marland Kitchen levothyroxine (SYNTHROID, LEVOTHROID) 150 MCG tablet Take 1 tablet (150 mcg total) by mouth daily.  30 tablet  1  . loratadine (CLARITIN) 10 MG tablet Take 10 mg by mouth daily.      . Menthol, Topical Analgesic, (ICY HOT PAIN RELIEVING EX) Apply 1 application topically daily as needed (pain).      . Misc Natural Products (GLUCOSAMINE CHONDROITIN VIT D3 PO) Take 2 tablets by mouth daily.      . Multiple Vitamins-Minerals (WOMENS 50+ MULTI VITAMIN/MIN PO) Take 1 tablet by mouth daily.      . naproxen sodium (ANAPROX) 220 MG tablet Take 220-440 mg by mouth as needed (pain).       Marland Kitchen olmesartan-hydrochlorothiazide (BENICAR HCT) 40-25 MG per tablet Take 1 tablet by mouth daily.  30 tablet  1  . POTASSIUM GLUCONATE PO Take 1 tablet by mouth daily.      . predniSONE (DELTASONE) 20 MG tablet Take 1 tablet (20 mg total) by mouth daily. Take  3 pills daily for 6 days then 2 pills for 2 days then 1 pill for 2 days then STOP.  30 tablet  0  . raltegravir (ISENTRESS) 400 MG tablet Take 400 mg by mouth 2 (two) times daily.      . vitamin C (ASCORBIC ACID) 250 MG tablet Take 250 mg by mouth daily.      Marland Kitchen VITAMIN E PO Take 1 tablet by mouth daily.      Marland Kitchen atorvastatin (LIPITOR) 10 MG tablet Take 10 mg by mouth daily.       No current facility-administered medications on file prior to visit.    Allergies  Allergen Reactions  . Lisinopril Swelling    Swelling of tongue and mouth    Family History  Problem Relation Age of Onset  . Heart attack Brother     Massive MI in 61s  . Heart disease Brother     CAD  . Diabetes Mother   . Diabetes Sister   . Thyroid disease Sister     had I-131 rx of hyperthyroidism  . Diabetes Sister   . Cancer Paternal Uncle   . Cancer Cousin    BP 122/80   Pulse 70  Ht 5\' 7"  (1.702 m)  Wt 282 lb (127.914 kg)  BMI 44.16 kg/m2  SpO2 98%  Review of Systems Denies weight change    Objective:   Physical Exam VITAL SIGNS:  See vs page GENERAL: no distress eyes: no periorbital swelling, but there is bilateral proptosis Skin: not diaphoretic Neuro: no tremor  Lab Results  Component Value Date   TSH 2.32 02/05/2013   T3TOTAL 271.2* 12/15/2007   T4TOTAL 13.9* 12/15/2007      Assessment & Plan:  Post-i-131 hypothyroidism, well-replaced

## 2013-02-24 ENCOUNTER — Other Ambulatory Visit: Payer: PRIVATE HEALTH INSURANCE

## 2013-02-25 ENCOUNTER — Other Ambulatory Visit: Payer: Self-pay

## 2013-02-25 DIAGNOSIS — B20 Human immunodeficiency virus [HIV] disease: Secondary | ICD-10-CM

## 2013-02-25 LAB — CBC WITH DIFFERENTIAL/PLATELET
Lymphocytes Relative: 41 % (ref 12–46)
Lymphs Abs: 2.2 10*3/uL (ref 0.7–4.0)
MCV: 94.2 fL (ref 78.0–100.0)
Neutrophils Relative %: 46 % (ref 43–77)
Platelets: 199 10*3/uL (ref 150–400)
RBC: 4.48 MIL/uL (ref 3.87–5.11)
WBC: 5.4 10*3/uL (ref 4.0–10.5)

## 2013-02-25 LAB — COMPLETE METABOLIC PANEL WITH GFR
AST: 24 U/L (ref 0–37)
BUN: 16 mg/dL (ref 6–23)
Calcium: 10.2 mg/dL (ref 8.4–10.5)
Chloride: 99 mEq/L (ref 96–112)
Creat: 1.08 mg/dL (ref 0.50–1.10)
Total Bilirubin: 0.4 mg/dL (ref 0.3–1.2)

## 2013-02-25 LAB — LIPID PANEL
Cholesterol: 203 mg/dL — ABNORMAL HIGH (ref 0–200)
HDL: 53 mg/dL (ref >39)
Total CHOL/HDL Ratio: 3.8 Ratio

## 2013-02-26 LAB — RPR

## 2013-02-27 LAB — HIV-1 RNA QUANT-NO REFLEX-BLD: HIV 1 RNA Quant: <20 copies/mL (ref <20)

## 2013-03-09 ENCOUNTER — Encounter: Payer: Self-pay | Admitting: Infectious Disease

## 2013-03-09 ENCOUNTER — Ambulatory Visit (INDEPENDENT_AMBULATORY_CARE_PROVIDER_SITE_OTHER): Payer: PRIVATE HEALTH INSURANCE | Admitting: Infectious Disease

## 2013-03-09 VITALS — BP 115/82 | HR 71 | Temp 97.5°F | Wt 284.0 lb

## 2013-03-09 DIAGNOSIS — E785 Hyperlipidemia, unspecified: Secondary | ICD-10-CM

## 2013-03-09 DIAGNOSIS — E039 Hypothyroidism, unspecified: Secondary | ICD-10-CM

## 2013-03-09 DIAGNOSIS — Z23 Encounter for immunization: Secondary | ICD-10-CM

## 2013-03-09 DIAGNOSIS — G510 Bell's palsy: Secondary | ICD-10-CM

## 2013-03-09 DIAGNOSIS — E119 Type 2 diabetes mellitus without complications: Secondary | ICD-10-CM

## 2013-03-09 DIAGNOSIS — I10 Essential (primary) hypertension: Secondary | ICD-10-CM

## 2013-03-09 DIAGNOSIS — B20 Human immunodeficiency virus [HIV] disease: Secondary | ICD-10-CM

## 2013-03-09 NOTE — Progress Notes (Signed)
  Subjective:    Patient ID: Kayla Price, female    DOB: 1956/02/22, 57 y.o.   MRN: [id number]  HPI   Kayla Price is a 57 y.o. female who is doing superbly well on her  antiviral regimen, of Atripla and Isentress with undetectable viral load and health cd4 count.  she is being seen by Dr. 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  HENT: Negative for congestion, sore throat, rhinorrhea, sneezing, trouble swallowing and sinus pressure.   Eyes: Negative for photophobia and visual disturbance.  Respiratory: Negative for cough, chest tightness, shortness of breath, wheezing and stridor.   Cardiovascular: Negative for chest pain, palpitations and leg swelling.  Gastrointestinal: Negative for nausea, vomiting, abdominal pain, diarrhea, constipation, blood in stool, abdominal distention and anal bleeding.  Genitourinary: Negative for dysuria, hematuria, flank pain and difficulty urinating.  Musculoskeletal: Negative for myalgias, back pain, joint swelling, arthralgias and gait problem.  Skin: Positive for color change. Negative for pallor, rash and wound.  Neurological: Negative for dizziness, tremors, weakness and light-headedness.  Hematological: Negative for adenopathy. Does not bruise/bleed easily.  Psychiatric/Behavioral: Negative for behavioral problems, confusion, sleep disturbance, dysphoric mood, decreased concentration and agitation.       Objective:   Physical Exam  Constitutional:  She is oriented to person, place, and time. She appears well-developed and well-nourished. No distress.  HENT:  Head: Normocephalic and atraumatic.    Mouth/Throat: Oropharynx is clear and moist. No oropharyngeal exudate.  Eyes: Conjunctivae and EOM are normal. Pupils are equal, round, and reactive to light. No scleral icterus.  Neck: Normal range of motion. Neck supple. No JVD present.  Cardiovascular: Normal rate, regular rhythm and normal heart sounds.  Exam reveals no gallop and no friction rub.   No murmur heard. Pulmonary/Chest: Effort normal and breath sounds normal. No respiratory distress. She has no wheezes. She has no rales. She exhibits no tenderness.  Abdominal: She exhibits no distension and no mass. There is no tenderness. There is no rebound and no guarding.  Musculoskeletal: She exhibits no edema and no tenderness.  Lymphadenopathy:    She has no cervical adenopathy.  Neurological: She is alert and oriented to person, place, and time. She has normal reflexes. She exhibits normal muscle tone. Coordination normal.  Skin: Skin is warm and dry. She is not diaphoretic. No erythema. No pallor.  Psychiatric: She has a normal mood and affect. Her behavior is normal. Judgment and thought content normal.          Assessment & Plan:  HIV DISEASE  Superbly controlled. Continue atripla and isentress   HYPERTENSION  Better controlled   HYPOTHYROIDISM, POST-RADIATION  Followed by Dr. Everardo All, TSH 2.32  Hyperlipidemia: LDL above 100 if she is diabetic now will need add back statin,  ? DM, prediabetic with hx of gestational dm: check poc a1c given weight gain  Bell's palsy: had a few months ago resolved

## 2013-03-16 ENCOUNTER — Other Ambulatory Visit: Payer: Self-pay

## 2013-03-16 MED ORDER — LEVOTHYROXINE SODIUM 150 MCG PO TABS: 150.0000 ug | ORAL_TABLET | Freq: Every day | ORAL | Status: DC

## 2013-03-27 ENCOUNTER — Other Ambulatory Visit: Payer: Self-pay | Admitting: Infectious Disease

## 2013-03-27 DIAGNOSIS — B20 Human immunodeficiency virus [HIV] disease: Secondary | ICD-10-CM

## 2013-03-30 ENCOUNTER — Other Ambulatory Visit: Payer: Self-pay

## 2013-03-30 DIAGNOSIS — Z1231 Encounter for screening mammogram for malignant neoplasm of breast: Secondary | ICD-10-CM

## 2013-04-13 ENCOUNTER — Other Ambulatory Visit: Payer: Self-pay

## 2013-04-13 DIAGNOSIS — I10 Essential (primary) hypertension: Secondary | ICD-10-CM

## 2013-04-13 MED ORDER — OLMESARTAN MEDOXOMIL-HCTZ 40-25 MG PO TABS: 1.0000 | ORAL_TABLET | Freq: Every day | ORAL | Status: DC

## 2013-04-13 NOTE — Telephone Encounter (Signed)
Refilled benicar to [facility]...ds,cma

## 2013-04-20 ENCOUNTER — Other Ambulatory Visit: Payer: Self-pay | Admitting: *Deleted

## 2013-04-20 DIAGNOSIS — I10 Essential (primary) hypertension: Secondary | ICD-10-CM

## 2013-04-20 MED ORDER — OLMESARTAN MEDOXOMIL-HCTZ 40-25 MG PO TABS: 1.0000 | ORAL_TABLET | Freq: Every day | ORAL | Status: DC

## 2013-04-29 ENCOUNTER — Ambulatory Visit
Admission: RE | Admit: 2013-04-29 | Discharge: 2013-04-29 | Disposition: A | Discharge status: Home / Self Care | Payer: 59 | Source: Ambulatory Visit

## 2013-04-29 DIAGNOSIS — Z1231 Encounter for screening mammogram for malignant neoplasm of breast: Secondary | ICD-10-CM

## 2013-05-01 ENCOUNTER — Other Ambulatory Visit: Payer: Self-pay | Admitting: Infectious Disease

## 2013-05-01 DIAGNOSIS — R928 Other abnormal and inconclusive findings on diagnostic imaging of breast: Secondary | ICD-10-CM

## 2013-05-14 ENCOUNTER — Other Ambulatory Visit: Payer: Self-pay | Admitting: *Deleted

## 2013-05-14 MED ORDER — LEVOTHYROXINE SODIUM 150 MCG PO TABS: 150.0000 ug | ORAL_TABLET | Freq: Every day | ORAL | Status: DC

## 2013-05-20 ENCOUNTER — Ambulatory Visit
Admission: RE | Admit: 2013-05-20 | Discharge: 2013-05-20 | Disposition: A | Discharge status: Home / Self Care | Payer: 59 | Source: Ambulatory Visit | Attending: Infectious Disease | Admitting: Infectious Disease

## 2013-05-20 DIAGNOSIS — R928 Other abnormal and inconclusive findings on diagnostic imaging of breast: Secondary | ICD-10-CM

## 2013-06-23 ENCOUNTER — Emergency Department (HOSPITAL_COMMUNITY)
Admission: EM | Admit: 2013-06-23 | Discharge: 2013-06-23 | Disposition: A | Discharge status: Home / Self Care | Payer: Medicare HMO | Attending: Emergency Medicine | Admitting: Emergency Medicine

## 2013-06-23 ENCOUNTER — Encounter (HOSPITAL_COMMUNITY): Payer: Self-pay | Admitting: Emergency Medicine

## 2013-06-23 ENCOUNTER — Emergency Department (HOSPITAL_COMMUNITY): Payer: Medicare HMO

## 2013-06-23 DIAGNOSIS — J209 Acute bronchitis, unspecified: Secondary | ICD-10-CM | POA: Insufficient documentation

## 2013-06-23 DIAGNOSIS — Z21 Asymptomatic human immunodeficiency virus [HIV] infection status: Secondary | ICD-10-CM | POA: Insufficient documentation

## 2013-06-23 DIAGNOSIS — Z8742 Personal history of other diseases of the female genital tract: Secondary | ICD-10-CM | POA: Insufficient documentation

## 2013-06-23 DIAGNOSIS — R0989 Other specified symptoms and signs involving the circulatory and respiratory systems: Secondary | ICD-10-CM | POA: Insufficient documentation

## 2013-06-23 DIAGNOSIS — Z8739 Personal history of other diseases of the musculoskeletal system and connective tissue: Secondary | ICD-10-CM | POA: Insufficient documentation

## 2013-06-23 DIAGNOSIS — M1993 Secondary osteoarthritis, unspecified site: Secondary | ICD-10-CM | POA: Insufficient documentation

## 2013-06-23 DIAGNOSIS — R062 Wheezing: Secondary | ICD-10-CM | POA: Insufficient documentation

## 2013-06-23 DIAGNOSIS — I10 Essential (primary) hypertension: Secondary | ICD-10-CM | POA: Insufficient documentation

## 2013-06-23 DIAGNOSIS — Z853 Personal history of malignant neoplasm of breast: Secondary | ICD-10-CM | POA: Insufficient documentation

## 2013-06-23 DIAGNOSIS — Z79899 Other long term (current) drug therapy: Secondary | ICD-10-CM | POA: Insufficient documentation

## 2013-06-23 DIAGNOSIS — E890 Postprocedural hypothyroidism: Secondary | ICD-10-CM | POA: Insufficient documentation

## 2013-06-23 DIAGNOSIS — E559 Vitamin D deficiency, unspecified: Secondary | ICD-10-CM | POA: Insufficient documentation

## 2013-06-23 DIAGNOSIS — J40 Bronchitis, not specified as acute or chronic: Secondary | ICD-10-CM

## 2013-06-23 DIAGNOSIS — I739 Peripheral vascular disease, unspecified: Secondary | ICD-10-CM | POA: Insufficient documentation

## 2013-06-23 DIAGNOSIS — Z888 Allergy status to other drugs, medicaments and biological substances status: Secondary | ICD-10-CM | POA: Insufficient documentation

## 2013-06-23 DIAGNOSIS — E782 Mixed hyperlipidemia: Secondary | ICD-10-CM | POA: Insufficient documentation

## 2013-06-23 MED ORDER — HYDROCODONE-HOMATROPINE 5-1.5 MG/5ML PO SYRP: 5.0000 mL | ORAL_SOLUTION | Freq: Four times a day (QID) | ORAL | Status: DC | PRN

## 2013-06-23 MED ORDER — LEVOFLOXACIN 750 MG PO TABS: 750.0000 mg | ORAL_TABLET | Freq: Every day | ORAL | Status: DC

## 2013-06-23 MED ORDER — ALBUTEROL SULFATE HFA 108 (90 BASE) MCG/ACT IN AERS: 2.0000 | INHALATION_SPRAY | RESPIRATORY_TRACT | Status: DC | PRN

## 2013-06-23 NOTE — ED Provider Notes (Signed)
CSN: [id number]     Arrival date & time 06/23/13  1428 History   First MD Initiated Contact with Patient 06/23/13 1736     Chief Complaint  Patient presents with  . URI  . Cough   (Consider location/radiation/quality/duration/timing/severity/associated sxs/prior Treatment) HPI Comments: Patient presents to the ER for cough and chest congestion. Symptoms began 3 days ago, progressively worsened. She reports that she has had a rattling in her chest and feels like she is wheezing. She has not documented any fevers. She has been taking over-the-counter cold medicine which has helped some of her nasal congestion, but has not helped her cough.  Patient is a 58 y.o. female presenting with URI and cough.  URI Presenting symptoms: cough   Presenting symptoms: no fever   Cough Associated symptoms: no fever     Past Medical History  Diagnosis Date  . HIV DISEASE 03/27/2006  . 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 Unspecified vitamin D deficiency 08/06/2007  . HYPERLIPIDEMIA, MIXED 12/15/2007  . HYPERTENSION 03/27/2006  . PVD 04/07/2007  . Alopecia areata 11/28/2009  . MENORRHAGIA, POSTMENOPAUSAL 02/03/2009  . TRIGGER FINGER 05/06/2008  . HIP FRACTURE, RIGHT 05/06/2008  . BREAST CANCER, HX OF 03/27/2006  . OSTEOARTHROSIS, LOCAL, SCND, UNSPC SITE 04/07/2007  . Fasting hyperglycemia   . Tinea capitis   . HIV (human immunodeficiency virus infection)    Past Surgical History  Procedure Laterality Date  . Tubal ligation    . Breast lumpectomy    . Lymph node dissection  2005  . Port-a-cath removal  2006    insertion 2005   Family History  Problem Relation Age of Onset  . Heart attack Brother     Massive MI in 65s  . Heart disease Brother     CAD  . Diabetes Mother   . Diabetes Sister   . Thyroid disease Sister     had I-131 rx of hyperthyroidism  . Diabetes Sister   . Cancer Paternal Uncle   . Cancer Cousin    History  Substance Use Topics  . Smoking status: Never Smoker    . Smokeless tobacco: Never Used  . Alcohol Use: No   OB History   Grav Para Term Preterm Abortions TAB SAB Ect Mult Living                 Review of Systems  Constitutional: Negative for fever.  Respiratory: Positive for cough.   All other systems reviewed and are negative.    Allergies  Lisinopril  Home Medications   Current Outpatient Rx  Name  Route  Sig  Dispense  Refill  . Artificial Tear Ointment (DRY EYES OP)   Ophthalmic   Apply 2 drops to eye 4 (four) times daily as needed (dry eyes).   1 Container   2   . B Complex-C (SUPER B COMPLEX PO)   Oral   Take 1 tablet by mouth daily.         Josefa Half Peroxide (EAR DROPS OT)   Right Ear   Place 2 drops into the right ear daily as needed (swimmers ear).         . Chromium Picolinate 800 MCG TABS   Oral   Take 800 mcg by mouth daily.         Marland Kitchen DM-Phenylephrine-Acetaminophen (TYLENOL COLD MULTI-SYMPTOM DAY PO)   Oral   Take 5 mLs by mouth at bedtime as needed (for cold/symptoms).         Marland Kitchen efavirenz-emtricitabine-tenofovir (  ATRIPLA) 600-200-300 MG per tablet   Oral   Take 1 tablet by mouth at bedtime.   30 tablet   11   . Hyaluronic Acid-Vitamin C (HYALURONIC ACID PO)   Oral   Take 1 tablet by mouth daily.         Marland Kitchen levothyroxine (SYNTHROID, LEVOTHROID) 150 MCG tablet   Oral   Take 1 tablet (150 mcg total) by mouth daily.   30 tablet   6   . loratadine (CLARITIN) 10 MG tablet   Oral   Take 10 mg by mouth daily.         . Menthol, Topical Analgesic, (ICY HOT PAIN RELIEVING EX)   Apply externally   Apply 1 application topically daily as needed (pain).         . Misc Natural Products (GLUCOSAMINE CHONDROITIN VIT D3 PO)   Oral   Take 2 tablets by mouth daily.         . Multiple Vitamins-Minerals (WOMENS 50+ MULTI VITAMIN/MIN PO)   Oral   Take 1 tablet by mouth daily.         . naproxen sodium (ANAPROX) 220 MG tablet   Oral   Take 220-440 mg by mouth as needed (pain).           Marland Kitchen olmesartan-hydrochlorothiazide (BENICAR HCT) 40-25 MG per tablet   Oral   Take 1 tablet by mouth daily.   30 tablet   1   . POTASSIUM GLUCONATE PO   Oral   Take 1 tablet by mouth daily.         . raltegravir (ISENTRESS) 400 MG tablet   Oral   Take 400 mg by mouth 2 (two) times daily.         . vitamin C (ASCORBIC ACID) 250 MG tablet   Oral   Take 250 mg by mouth daily.         Marland Kitchen VITAMIN E PO   Oral   Take 1 tablet by mouth daily.          BP 123/76  Pulse 88  Temp(Src) 98.6 F (37 C) (Oral)  Resp 18  SpO2 92% Physical Exam  Constitutional: She is oriented to person, place, and time. She appears well-developed and well-nourished. No distress.  HENT:  Head: Normocephalic and atraumatic.  Right Ear: Hearing normal.  Left Ear: Hearing normal.  Nose: Nose normal.  Mouth/Throat: Oropharynx is clear and moist and mucous membranes are normal.  Eyes: Conjunctivae and EOM are normal. Pupils are equal, round, and reactive to light.  Neck: Normal range of motion. Neck supple.  Cardiovascular: Regular rhythm, S1 normal and S2 normal.  Exam reveals no gallop and no friction rub.   No murmur heard. Pulmonary/Chest: Effort normal. No respiratory distress. She has wheezes. She exhibits no tenderness.  Abdominal: Soft. Normal appearance and bowel sounds are normal. There is no hepatosplenomegaly. There is no tenderness. There is no rebound, no guarding, no tenderness at McBurney's point and negative Murphy's sign. No hernia.  Musculoskeletal: Normal range of motion.  Neurological: She is alert and oriented to person, place, and time. She has normal strength. No cranial nerve deficit or sensory deficit. Coordination normal. GCS eye subscore is 4. GCS verbal subscore is 5. GCS motor subscore is 6.  Skin: Skin is warm, dry and intact. No rash noted. No cyanosis.  Psychiatric: She has a normal mood and affect. Her speech is normal and behavior is normal. Thought content  normal.  ED Course  Procedures (including critical care time) Labs Review Labs Reviewed - No data to display Imaging Review Dg Chest 2 View (if Patient Has Fever And/or Copd)  06/23/2013   CLINICAL DATA:  Cough and congestion.  History of breast cancer.  EXAM: CHEST  2 VIEW  COMPARISON:  04/19/2009  FINDINGS: Heart size is normal. Mediastinal shadows are normal. The lungs are clear. No effusions. No bony abnormalities.  IMPRESSION: Normal chest   Electronically Signed   By: Paulina Fusi M.D.   On: 06/23/2013 15:30    EKG Interpretation   None       MDM  Diagnosis: Bronchitis  Patient presents to the ER for evaluation of 3 days of progressively worsening cough and chest congestion. Patient does have slight wheezing on auscultation, but oxygenation is 90-100%. She is breathing comfortably. Chest x-ray does not show any evidence of pneumonia. Patient does have HIV. She is on triple therapy. She is breathing comfortably, afebrile without evidence of pneumonia, it is reasonable to treat as an outpatient. Patient will be treated with albuterol and Levaquin, Hycodin. Return to the ER she is worsening difficulty breathing.    Gilda Crease, MD 06/23/13 [phone]

## 2013-06-23 NOTE — ED Notes (Signed)
Pt c/o URI sx with cough and congestion x 3 days; pt denies fever

## 2013-07-09 ENCOUNTER — Emergency Department (HOSPITAL_COMMUNITY)
Admission: EM | Admit: 2013-07-09 | Discharge: 2013-07-09 | Disposition: A | Discharge status: Home / Self Care | Payer: Medicare HMO | Attending: Emergency Medicine | Admitting: Emergency Medicine

## 2013-07-09 ENCOUNTER — Encounter (HOSPITAL_COMMUNITY): Payer: Self-pay | Admitting: Emergency Medicine

## 2013-07-09 DIAGNOSIS — M1990 Unspecified osteoarthritis, unspecified site: Secondary | ICD-10-CM | POA: Insufficient documentation

## 2013-07-09 DIAGNOSIS — E890 Postprocedural hypothyroidism: Secondary | ICD-10-CM | POA: Insufficient documentation

## 2013-07-09 DIAGNOSIS — Z853 Personal history of malignant neoplasm of breast: Secondary | ICD-10-CM | POA: Insufficient documentation

## 2013-07-09 DIAGNOSIS — Z79899 Other long term (current) drug therapy: Secondary | ICD-10-CM | POA: Insufficient documentation

## 2013-07-09 DIAGNOSIS — Z8781 Personal history of (healed) traumatic fracture: Secondary | ICD-10-CM | POA: Insufficient documentation

## 2013-07-09 DIAGNOSIS — J3489 Other specified disorders of nose and nasal sinuses: Secondary | ICD-10-CM | POA: Insufficient documentation

## 2013-07-09 DIAGNOSIS — I10 Essential (primary) hypertension: Secondary | ICD-10-CM | POA: Insufficient documentation

## 2013-07-09 DIAGNOSIS — Z21 Asymptomatic human immunodeficiency virus [HIV] infection status: Secondary | ICD-10-CM | POA: Insufficient documentation

## 2013-07-09 DIAGNOSIS — R059 Cough, unspecified: Secondary | ICD-10-CM | POA: Insufficient documentation

## 2013-07-09 DIAGNOSIS — Z8742 Personal history of other diseases of the female genital tract: Secondary | ICD-10-CM | POA: Insufficient documentation

## 2013-07-09 DIAGNOSIS — R05 Cough: Secondary | ICD-10-CM | POA: Insufficient documentation

## 2013-07-09 DIAGNOSIS — Z872 Personal history of diseases of the skin and subcutaneous tissue: Secondary | ICD-10-CM | POA: Insufficient documentation

## 2013-07-09 MED ORDER — PSEUDOEPHEDRINE HCL 30 MG PO TABS: 30.0000 mg | ORAL_TABLET | ORAL | Status: DC | PRN

## 2013-07-09 MED ORDER — BENZONATATE 100 MG PO CAPS: 100.0000 mg | ORAL_CAPSULE | Freq: Three times a day (TID) | ORAL | Status: DC

## 2013-07-09 NOTE — Discharge Instructions (Signed)
Cough, Adult  A cough is a reflex. It helps you clear your throat and airways. A cough can help heal your body. A cough can last 2 or 3 weeks (acute) or may last more than 8 weeks (chronic). Some common causes of a cough can include an infection, allergy, or a cold. HOME CARE  Only take medicine as told by your doctor.  If given, take your medicines (antibiotics) as told. Finish them even if you start to feel better.  Use a cold steam vaporizer or humidier in your home. This can help loosen thick spit (secretions).  Sleep so you are almost sitting up (semi-upright). Use pillows to do this. This helps reduce coughing.  Rest as needed.  Stop smoking if you smoke. GET HELP RIGHT AWAY IF:  You have yellowish-white fluid (pus) in your thick spit.  Your cough gets worse.  Your medicine does not reduce coughing, and you are losing sleep.  You cough up blood.  You have trouble breathing.  Your pain gets worse and medicine does not help.  You have a fever. MAKE SURE YOU:   Understand these instructions.  Will watch your condition.  Will get help right away if you are not doing well or get worse. Document Released: 02/22/2011 Document Revised: 09/03/2011 Document Reviewed: 02/22/2011 [employer] Patient Information 2014 [employer].  

## 2013-07-09 NOTE — ED Provider Notes (Signed)
CSN: [id number]     Arrival date & time 07/09/13  1217 History   First MD Initiated Contact with Patient 07/09/13 1253    This chart was scribed for Noland Fordyce PA-C, a non-physician practitioner working with No att. providers found by Denice Bors, ED Scribe. This patient was seen in room TR08C/TR08C and the patient's care was started at 2:29 PM     Chief Complaint  Patient presents with  . Bronchitis   (Consider location/radiation/quality/duration/timing/severity/associated sxs/prior Treatment) The history is provided by the patient and medical records. No language interpreter was used.   HPI Comments: Kayla Price is a 58 y.o. female who presents to the Emergency Department with PMHx of HIV complaining of improving constant non-productive cough onset 20 days ago. Evaluated on 06/23/13 for the same. Describes cough as improving, but lingering. Reports associated congestion. Reports symptoms is exacerbated at night. Reports she was prescribed albuterol, Hycodin, and Levaquin. Reports albuterol, Levaquin, and cough medicine mildly alleviates symptoms. Denies associated fever, emesis, nausea, chest pain, dysuria, diarrhea and generalized myalgias. Possible sick contacts (relatives).   Viral load: undetectable. CD4: WNL  Past Medical History  Diagnosis Date  . HIV DISEASE 03/27/2006  . 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 Unspecified vitamin D deficiency 08/06/2007  . HYPERLIPIDEMIA, MIXED 12/15/2007  . HYPERTENSION 03/27/2006  . PVD 04/07/2007  . Alopecia areata 11/28/2009  . MENORRHAGIA, POSTMENOPAUSAL 02/03/2009  . TRIGGER FINGER 05/06/2008  . HIP FRACTURE, RIGHT 05/06/2008  . BREAST CANCER, HX OF 03/27/2006  . OSTEOARTHROSIS, LOCAL, SCND, UNSPC SITE 04/07/2007  . Fasting hyperglycemia   . Tinea capitis   . HIV (human immunodeficiency virus infection)    Past Surgical History  Procedure Laterality Date  . Tubal ligation    . Breast lumpectomy    . Lymph node  dissection  2005  . Port-a-cath removal  2006    insertion 2005   Family History  Problem Relation Age of Onset  . Heart attack Brother     Massive MI in 60s  . Heart disease Brother     CAD  . Diabetes Mother   . Diabetes Sister   . Thyroid disease Sister     had I-131 rx of hyperthyroidism  . Diabetes Sister   . Cancer Paternal Uncle   . Cancer Cousin    History  Substance Use Topics  . Smoking status: Never Smoker   . Smokeless tobacco: Never Used  . Alcohol Use: No   OB History   Grav Para Term Preterm Abortions TAB SAB Ect Mult Living                 Review of Systems  Constitutional: Negative for fever.  HENT: Positive for congestion.   Respiratory: Positive for cough.   Gastrointestinal: Negative for nausea, vomiting and diarrhea.  Genitourinary: Negative for dysuria.  Musculoskeletal: Negative for myalgias.  Psychiatric/Behavioral: Negative for confusion.    Allergies  Lisinopril  Home Medications   Current Outpatient Rx  Name  Route  Sig  Dispense  Refill  . albuterol (PROVENTIL HFA;VENTOLIN HFA) 108 (90 BASE) MCG/ACT inhaler   Inhalation   Inhale 2 puffs into the lungs every 4 (four) hours as needed for wheezing or shortness of breath.   1 Inhaler   0   . B Complex-C (SUPER B COMPLEX PO)   Oral   Take 1 tablet by mouth daily.         . Chromium Picolinate 800 MCG TABS   Oral  Take 800 mcg by mouth daily.         Marland Kitchen efavirenz-emtricitabine-tenofovir (ATRIPLA) 600-200-300 MG per tablet   Oral   Take 1 tablet by mouth at bedtime.   30 tablet   11   . levothyroxine (SYNTHROID, LEVOTHROID) 150 MCG tablet   Oral   Take 1 tablet (150 mcg total) by mouth daily.   30 tablet   6   . MAGNESIUM OXIDE PO   Oral   Take 1 tablet by mouth daily.         . Multiple Vitamins-Minerals (WOMENS 50+ MULTI VITAMIN/MIN PO)   Oral   Take 1 tablet by mouth daily.         . naproxen sodium (ANAPROX) 220 MG tablet   Oral   Take 220-440 mg by  mouth daily as needed (pain).          Marland Kitchen olmesartan-hydrochlorothiazide (BENICAR HCT) 40-25 MG per tablet   Oral   Take 1 tablet by mouth daily.   30 tablet   1   . OVER THE COUNTER MEDICATION   Oral   Take 1 tablet by mouth daily. For joint health         . POTASSIUM GLUCONATE PO   Oral   Take 1 tablet by mouth daily.         . raltegravir (ISENTRESS) 400 MG tablet   Oral   Take 400 mg by mouth 2 (two) times daily.         . vitamin C (ASCORBIC ACID) 250 MG tablet   Oral   Take 250 mg by mouth daily.         Marland Kitchen VITAMIN E PO   Oral   Take 1 tablet by mouth daily.         . benzonatate (TESSALON) 100 MG capsule   Oral   Take 1 capsule (100 mg total) by mouth every 8 (eight) hours.   21 capsule   0   . pseudoephedrine (SUDAFED) 30 MG tablet   Oral   Take 1 tablet (30 mg total) by mouth every 4 (four) hours as needed for congestion.   15 tablet   0    BP 106/81  Pulse 82  Temp(Src) 98.2 F (36.8 C) (Oral)  Resp 18  SpO2 97% Physical Exam  Nursing note and vitals reviewed. Constitutional: She is oriented to person, place, and time. She appears well-developed and well-nourished.  HENT:  Head: Normocephalic and atraumatic.  Nose: Mucosal edema present.  Eyes: EOM are normal.  Neck: Normal range of motion.  Cardiovascular: Normal rate and regular rhythm.   Pulmonary/Chest: Effort normal and breath sounds normal. Not tachypneic. No respiratory distress. She has no wheezes. She has no rhonchi. She has no rales.  No respiratory distress, able to speak in full sentences w/o difficulty. Lungs: CTAB  Musculoskeletal: Normal range of motion.  Neurological: She is alert and oriented to person, place, and time.  Skin: Skin is warm and dry.  Psychiatric: She has a normal mood and affect. Her behavior is normal.    ED Course  Procedures  COORDINATION OF CARE:  Nursing notes reviewed. Vital signs reviewed. Initial pt interview and examination performed.    2:08 PM-Discussed treatment plan with pt at bedside. Pt agrees with plan.   Treatment plan initiated:Medications - No data to display   Initial diagnostic testing ordered.    Labs Review Labs Reviewed - No data to display Imaging Review No results found.  EKG  Interpretation   None       MDM   1. Cough    Pt c/o persistent dry cough. Pt has completed full coarse of Levaquin 1-2 weeks ago.  CXR normal at that time. Today, virals: WNL. No respiratory distress. Lungs: CTAB.  Do not believe another course of antibiotics warranted at this time. Rx: tessalon, sudafed. Advised to f/u with PCP at [place] [facility] next week if symptoms not improving. Advised cough may linger up to 2 weeks. Return precautions given. Pt verbalized understanding and agreement with tx plan.  I personally performed the services described in this documentation, which was scribed in my presence. The recorded information has been reviewed and is accurate.    Noland Fordyce, PA-C 07/09/13 1429

## 2013-07-09 NOTE — ED Provider Notes (Signed)
Medical screening examination/treatment/procedure(s) were performed by non-physician practitioner and as supervising physician I was immediately available for consultation/collaboration.  EKG Interpretation   None         Elmer Sow, MD 07/09/13 1740

## 2013-07-09 NOTE — ED Notes (Signed)
Pt was here on 12/30 for same. Was diagnosed with bronchitis and has taken all her meds prescribed but no relief of non productive cough. Airway intact, no acute distress noted.

## 2013-07-13 ENCOUNTER — Other Ambulatory Visit: Payer: Self-pay | Admitting: Endocrinology

## 2013-07-14 ENCOUNTER — Other Ambulatory Visit: Payer: Self-pay | Admitting: *Deleted

## 2013-07-14 DIAGNOSIS — I10 Essential (primary) hypertension: Secondary | ICD-10-CM

## 2013-07-14 MED ORDER — OLMESARTAN MEDOXOMIL-HCTZ 40-25 MG PO TABS: 1.0000 | ORAL_TABLET | Freq: Every day | ORAL | Status: DC

## 2013-08-03 ENCOUNTER — Other Ambulatory Visit: Payer: Self-pay | Admitting: *Deleted

## 2013-08-03 DIAGNOSIS — B20 Human immunodeficiency virus [HIV] disease: Secondary | ICD-10-CM

## 2013-08-03 MED ORDER — EFAVIRENZ-EMTRICITAB-TENOFOVIR 600-200-300 MG PO TABS: 1.0000 | ORAL_TABLET | Freq: Every day | ORAL | Status: DC

## 2013-09-08 ENCOUNTER — Other Ambulatory Visit (INDEPENDENT_AMBULATORY_CARE_PROVIDER_SITE_OTHER): Payer: Commercial Managed Care - HMO

## 2013-09-08 DIAGNOSIS — E782 Mixed hyperlipidemia: Secondary | ICD-10-CM

## 2013-09-08 DIAGNOSIS — B20 Human immunodeficiency virus [HIV] disease: Secondary | ICD-10-CM

## 2013-09-08 LAB — CBC WITH DIFFERENTIAL/PLATELET
BASOS ABS: 0 10*3/uL (ref 0.0–0.1)
BASOS PCT: 1 % (ref 0–1)
EOS ABS: 0.2 10*3/uL (ref 0.0–0.7)
EOS PCT: 5 % (ref 0–5)
HEMATOCRIT: 44.6 % (ref 36.0–46.0)
Hemoglobin: 15.6 g/dL — ABNORMAL HIGH (ref 12.0–15.0)
Lymphocytes Relative: 42 % (ref 12–46)
Lymphs Abs: 2.1 10*3/uL (ref 0.7–4.0)
MCH: 33.5 pg (ref 26.0–34.0)
MCHC: 35 g/dL (ref 30.0–36.0)
MCV: 95.7 fL (ref 78.0–100.0)
MONO ABS: 0.4 10*3/uL (ref 0.1–1.0)
Monocytes Relative: 8 % (ref 3–12)
NEUTROS ABS: 2.2 10*3/uL (ref 1.7–7.7)
Neutrophils Relative %: 44 % (ref 43–77)
Platelets: 209 10*3/uL (ref 150–400)
RBC: 4.66 MIL/uL (ref 3.87–5.11)
RDW: 14.3 % (ref 11.5–15.5)
WBC: 4.9 10*3/uL (ref 4.0–10.5)

## 2013-09-08 LAB — COMPLETE METABOLIC PANEL WITHOUT GFR
ALT: 30 U/L (ref 0–35)
AST: 28 U/L (ref 0–37)
Albumin: 4.1 g/dL (ref 3.5–5.2)
Alkaline Phosphatase: 104 U/L (ref 39–117)
BUN: 16 mg/dL (ref 6–23)
CO2: 25 meq/L (ref 19–32)
Calcium: 10.1 mg/dL (ref 8.4–10.5)
Chloride: 101 meq/L (ref 96–112)
Creat: 1.15 mg/dL — ABNORMAL HIGH (ref 0.50–1.10)
GFR, Est African American: 61 mL/min
GFR, Est Non African American: 53 mL/min — ABNORMAL LOW
Glucose, Bld: 119 mg/dL — ABNORMAL HIGH (ref 70–99)
Potassium: 3.5 meq/L (ref 3.5–5.3)
Sodium: 138 meq/L (ref 135–145)
Total Bilirubin: 0.3 mg/dL (ref 0.2–1.2)
Total Protein: 7.9 g/dL (ref 6.0–8.3)

## 2013-09-08 LAB — LIPID PANEL
Cholesterol: 201 mg/dL — ABNORMAL HIGH (ref 0–200)
HDL: 54 mg/dL
LDL Cholesterol: 109 mg/dL — ABNORMAL HIGH (ref 0–99)
Total CHOL/HDL Ratio: 3.7 ratio
Triglycerides: 192 mg/dL — ABNORMAL HIGH
VLDL: 38 mg/dL (ref 0–40)

## 2013-09-09 LAB — T-HELPER CELL (CD4) - (RCID CLINIC ONLY)
CD4 % Helper T Cell: 40 % (ref 33–55)
CD4 T Cell Abs: 810 /uL (ref 400–2700)

## 2013-09-09 LAB — HIV-1 RNA QUANT-NO REFLEX-BLD: HIV 1 RNA Quant: <20 copies/mL (ref <20)

## 2013-09-09 LAB — HEPATITIS C ANTIBODY: HCV Ab: NEGATIVE

## 2013-09-09 LAB — RPR

## 2013-09-14 ENCOUNTER — Other Ambulatory Visit: Payer: Self-pay

## 2013-09-14 DIAGNOSIS — I10 Essential (primary) hypertension: Secondary | ICD-10-CM

## 2013-09-14 MED ORDER — OLMESARTAN MEDOXOMIL-HCTZ 40-25 MG PO TABS
1.0000 | ORAL_TABLET | Freq: Every day | ORAL | Status: DC
Start: 2013-09-14 — End: 2014-01-19

## 2013-09-21 ENCOUNTER — Encounter: Payer: Self-pay | Admitting: Infectious Disease

## 2013-09-21 ENCOUNTER — Ambulatory Visit (INDEPENDENT_AMBULATORY_CARE_PROVIDER_SITE_OTHER): Payer: Commercial Managed Care - HMO | Admitting: Infectious Disease

## 2013-09-21 VITALS — BP 130/90 | HR 75 | Temp 97.2°F | Wt 275.0 lb

## 2013-09-21 DIAGNOSIS — G510 Bell's palsy: Secondary | ICD-10-CM

## 2013-09-21 DIAGNOSIS — I10 Essential (primary) hypertension: Secondary | ICD-10-CM

## 2013-09-21 DIAGNOSIS — R7309 Other abnormal glucose: Secondary | ICD-10-CM

## 2013-09-21 DIAGNOSIS — R7303 Prediabetes: Secondary | ICD-10-CM

## 2013-09-21 DIAGNOSIS — B20 Human immunodeficiency virus [HIV] disease: Secondary | ICD-10-CM

## 2013-09-21 DIAGNOSIS — E039 Hypothyroidism, unspecified: Secondary | ICD-10-CM

## 2013-09-21 NOTE — Progress Notes (Signed)
Subjective:    Patient ID: Kayla Price, female    DOB: 21-May-1956, 58 y.o.   MRN: [id number]  HPI   Kayla Price is a 58 y.o. female who is doing superbly well on her  antiviral regimen, of Atripla and Isentress with undetectable viral load and health cd4 count.  she is being seen by Dr. 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  HENT: Negative for congestion, rhinorrhea, sinus pressure, sneezing, sore throat and trouble swallowing.   Eyes: Negative for photophobia and visual disturbance.  Respiratory: Negative for cough, chest tightness, shortness of breath, wheezing and stridor.   Cardiovascular: Negative for chest pain, palpitations and leg swelling.  Gastrointestinal: Negative for nausea, vomiting, abdominal pain, diarrhea, constipation, blood in stool, abdominal distention and anal bleeding.  Genitourinary: Negative for dysuria, hematuria, flank pain and difficulty urinating.  Musculoskeletal: Negative for arthralgias, back pain, gait problem, joint swelling and myalgias.  Skin: Positive for color change. Negative for pallor, rash and wound.  Neurological: Negative for dizziness, tremors, weakness and light-headedness.  Hematological: Negative for adenopathy. Does not bruise/bleed easily.  Psychiatric/Behavioral: Negative for behavioral problems, confusion, sleep disturbance, dysphoric mood, decreased concentration and agitation.       Objective:   Physical Exam  Constitutional: She is oriented to person, place, and time. She appears well-developed and well-nourished. No distress.  HENT:  Head: Normocephalic and atraumatic.     Mouth/Throat: Oropharynx is clear and moist. No oropharyngeal exudate.  Eyes: Conjunctivae and EOM are normal. Pupils are equal, round, and reactive to light. No scleral icterus.  Neck: Normal range of motion. Neck supple. No JVD present.  Cardiovascular: Normal rate, regular rhythm and normal heart sounds.  Exam reveals no gallop and no friction rub.   No murmur heard. Pulmonary/Chest: Effort normal and breath sounds normal. No respiratory distress. She has no wheezes. She has no rales. She exhibits no tenderness.  Abdominal: She exhibits no distension and no mass. There is no tenderness. There is no rebound and no guarding.  Musculoskeletal: She exhibits no edema and no tenderness.  Lymphadenopathy:    She has no cervical adenopathy.  Neurological: She is alert and oriented to person, place, and time. She has normal reflexes. She exhibits normal muscle tone. Coordination normal.  Skin: Skin is warm and dry. She is not diaphoretic. No erythema. No pallor.  Psychiatric: She has a normal mood and affect. Her behavior is normal. Judgment and thought content normal.          Assessment & Plan:  HIV DISEASE  Superbly controlled. Continue atripla and isentress, RTC 7 months I spent greater than 25 minutes with the patient including greater than 50% of time in face to face counsel of the patient and in coordination of their care.   HYPERTENSION  Up a bit today, maybe partially white coat htn, keep  log  HYPOTHYROIDISM, POST-RADIATION  Followed by Dr. Loanne Drilling, TSH 2.32  Hyperlipidemia: LDL above 100 if she is diabetic now will need add back statin,  ? DM, prediabetic with hx of gestational dm: A1c at 5,9 Hopefully continue to lose weight  Bell's palsy: had a few months ago resolved

## 2013-11-03 ENCOUNTER — Other Ambulatory Visit: Payer: Self-pay | Admitting: Infectious Disease

## 2013-11-16 ENCOUNTER — Other Ambulatory Visit: Payer: Self-pay | Admitting: Endocrinology

## 2013-12-14 ENCOUNTER — Other Ambulatory Visit: Payer: Self-pay | Admitting: *Deleted

## 2013-12-14 MED ORDER — LEVOTHYROXINE SODIUM 150 MCG PO TABS: 150.0000 ug | ORAL_TABLET | Freq: Every day | ORAL | Status: DC

## 2014-01-12 ENCOUNTER — Telehealth: Payer: Self-pay

## 2014-01-12 NOTE — Telephone Encounter (Signed)
i only saw this patient once, for her thyroid.  benicar was refilled under protocol by mistake.  She needs to see pcp for refill.

## 2014-01-12 NOTE — Telephone Encounter (Signed)
Pharmacy notified.

## 2014-01-12 NOTE — Telephone Encounter (Signed)
Received a refill request from pt's pharmacy requesting for Benicar. Please advise if ok to refill. Pt has not been seen since 01/2013.  Thanks!

## 2014-01-18 ENCOUNTER — Other Ambulatory Visit: Payer: Self-pay

## 2014-01-18 MED ORDER — OLMESARTAN MEDOXOMIL-HCTZ 40-25 MG PO TABS: ORAL_TABLET | ORAL | Status: DC

## 2014-01-19 ENCOUNTER — Other Ambulatory Visit: Payer: Self-pay

## 2014-01-19 ENCOUNTER — Telehealth: Payer: Self-pay | Admitting: Endocrinology

## 2014-01-19 MED ORDER — OLMESARTAN MEDOXOMIL-HCTZ 40-25 MG PO TABS: 1.0000 | ORAL_TABLET | Freq: Every day | ORAL | Status: DC

## 2014-01-19 NOTE — Telephone Encounter (Signed)
Pt advised that benicar was sent to [facility]. Further refills will come from PCP.

## 2014-01-19 NOTE — Telephone Encounter (Signed)
Pt needs Venticar called in. The BP med.

## 2014-02-08 ENCOUNTER — Other Ambulatory Visit: Payer: Self-pay | Admitting: Infectious Disease

## 2014-02-08 DIAGNOSIS — R921 Mammographic calcification found on diagnostic imaging of breast: Secondary | ICD-10-CM

## 2014-02-15 ENCOUNTER — Other Ambulatory Visit: Payer: Self-pay

## 2014-02-15 ENCOUNTER — Ambulatory Visit
Admission: RE | Admit: 2014-02-15 | Discharge: 2014-02-15 | Disposition: A | Discharge status: Home / Self Care | Payer: Commercial Managed Care - HMO | Source: Ambulatory Visit | Attending: Infectious Disease | Admitting: Infectious Disease

## 2014-02-15 DIAGNOSIS — R921 Mammographic calcification found on diagnostic imaging of breast: Secondary | ICD-10-CM

## 2014-02-15 MED ORDER — LEVOTHYROXINE SODIUM 150 MCG PO TABS: 150.0000 ug | ORAL_TABLET | Freq: Every day | ORAL | Status: DC

## 2014-03-15 ENCOUNTER — Other Ambulatory Visit: Payer: Self-pay | Admitting: Endocrinology

## 2014-03-15 NOTE — Telephone Encounter (Signed)
please call patient: This refill needs to go to your pcp If you do not have pcp, go to urgent care Thyroid f/u here is due

## 2014-03-15 NOTE — Telephone Encounter (Signed)
Please advise if ok to refill. Last time pt was her was 02/05/2013.  Thanks!

## 2014-03-22 ENCOUNTER — Other Ambulatory Visit: Payer: Self-pay | Admitting: *Deleted

## 2014-03-22 ENCOUNTER — Telehealth: Payer: Self-pay | Admitting: *Deleted

## 2014-03-22 DIAGNOSIS — I10 Essential (primary) hypertension: Secondary | ICD-10-CM

## 2014-03-22 MED ORDER — OLMESARTAN MEDOXOMIL-HCTZ 40-25 MG PO TABS
1.0000 | ORAL_TABLET | Freq: Every day | ORAL | Status: DC
Start: 2014-03-22 — End: 2014-09-09

## 2014-03-22 NOTE — Telephone Encounter (Signed)
Patient asked if Dr. Tommy Medal will be willing to prescribe/refill her Benicar. Was mistakenly refilled by her endocrinologist. Please advise. Landis Gandy, RN

## 2014-03-23 NOTE — Telephone Encounter (Signed)
If we wrote the script yest. If it is her primary care then they should do it

## 2014-03-25 ENCOUNTER — Encounter: Payer: Self-pay | Admitting: Endocrinology

## 2014-03-25 ENCOUNTER — Ambulatory Visit (INDEPENDENT_AMBULATORY_CARE_PROVIDER_SITE_OTHER): Payer: Commercial Managed Care - HMO | Admitting: Endocrinology

## 2014-03-25 VITALS — BP 126/82 | HR 79 | Temp 97.6°F | Ht 67.0 in | Wt 273.0 lb

## 2014-03-25 DIAGNOSIS — E890 Postprocedural hypothyroidism: Secondary | ICD-10-CM

## 2014-03-25 LAB — TSH: TSH: 2.42 u[IU]/mL (ref 0.35–4.50)

## 2014-03-25 NOTE — Progress Notes (Signed)
Subjective:    Patient ID: Kayla Price, female    DOB: February 25, 1956, 58 y.o.   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 in 2009, and now takes synthroid, 150/day, as rx'ed.  pt states she feels well in general.   Past Medical History  Diagnosis Date  . HIV DISEASE 03/27/2006  . 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 Unspecified vitamin D deficiency 08/06/2007  . HYPERLIPIDEMIA, MIXED 12/15/2007  . HYPERTENSION 03/27/2006  . PVD 04/07/2007  . Alopecia areata 11/28/2009  . MENORRHAGIA, POSTMENOPAUSAL 02/03/2009  . TRIGGER FINGER 05/06/2008  . HIP FRACTURE, RIGHT 05/06/2008  . BREAST CANCER, HX OF 03/27/2006  . OSTEOARTHROSIS, LOCAL, SCND, UNSPC SITE 04/07/2007  . Fasting hyperglycemia   . Tinea capitis   . HIV (human immunodeficiency virus infection)     Past Surgical History  Procedure Laterality Date  . Tubal ligation    . Breast lumpectomy    . Lymph node dissection  2005  . Port-a-cath removal  2006    insertion 2005    History   Social History  . Marital Status: Widowed    Spouse Name: N/A    Number of Children: N/A  . Years of Education: N/A   Occupational History  . Tour manager    Social History Main Topics  . Smoking status: Never Smoker   . Smokeless tobacco: Never Used  . Alcohol Use: No  . Drug Use: No  . Sexual Activity: Not Currently     Comment: delcined condoms   Other Topics Concern  . Not on file   Social History Narrative   single    Current Outpatient Prescriptions on File Prior to Visit  Medication Sig Dispense Refill  . albuterol (PROVENTIL HFA;VENTOLIN HFA) 108 (90 BASE) MCG/ACT inhaler Inhale 2 puffs into the lungs every 4 (four) hours as needed for wheezing or shortness of breath.  1 Inhaler  0  . B Complex-C (SUPER B COMPLEX PO) Take 1 tablet by mouth daily.      . Chromium Picolinate 800 MCG TABS Take 800 mcg by mouth daily.      Marland Kitchen efavirenz-emtricitabine-tenofovir (ATRIPLA) 600-200-300 MG  per tablet Take 1 tablet by mouth at bedtime.  30 tablet  2  . ISENTRESS 400 MG tablet TAKE 1 TABLET (400 MG TOTAL) BY MOUTH 2 (TWO) TIMES DAILY.  60 tablet  6  . levothyroxine (SYNTHROID, LEVOTHROID) 150 MCG tablet Take 1 tablet (150 mcg total) by mouth daily.  30 tablet  1  . MAGNESIUM OXIDE PO Take 1 tablet by mouth daily.      . Multiple Vitamins-Minerals (WOMENS 50+ MULTI VITAMIN/MIN PO) Take 1 tablet by mouth daily.      . naproxen sodium (ANAPROX) 220 MG tablet Take 220-440 mg by mouth daily as needed (pain).       Marland Kitchen olmesartan-hydrochlorothiazide (BENICAR HCT) 40-25 MG per tablet Take 1 tablet by mouth daily.  30 tablet  5  . OVER THE COUNTER MEDICATION Take 1 tablet by mouth daily. For joint health      . POTASSIUM GLUCONATE PO Take 1 tablet by mouth daily.      . pseudoephedrine (SUDAFED) 30 MG tablet Take 1 tablet (30 mg total) by mouth every 4 (four) hours as needed for congestion.  15 tablet  0  . raltegravir (ISENTRESS) 400 MG tablet Take 400 mg by mouth 2 (two) times daily.      . vitamin C (ASCORBIC ACID) 250  MG tablet Take 250 mg by mouth daily.      Marland Kitchen VITAMIN E PO Take 1 tablet by mouth daily.       No current facility-administered medications on file prior to visit.    Allergies  Allergen Reactions  . Lisinopril Swelling    Swelling of tongue and mouth    Family History  Problem Relation Age of Onset  . Heart attack Brother     Massive MI in 82s  . Heart disease Brother     CAD  . Diabetes Mother   . Diabetes Sister   . Thyroid disease Sister     had I-131 rx of hyperthyroidism  . Diabetes Sister   . Cancer Paternal Uncle   . Cancer Cousin     BP 126/82  Pulse 79  Temp(Src) 97.6 F (36.4 C) (Oral)  Ht 5\' 7"  (1.702 m)  Wt 273 lb (123.832 kg)  BMI 42.75 kg/m2  SpO2 95%   Review of Systems Denies weight change    Objective:   Physical Exam VITAL SIGNS:  See vs page GENERAL: no distress NECK: There is no palpable thyroid enlargement.  No thyroid  nodule is palpable.  No palpable lymphadenopathy at the anterior neck.   Lab Results  Component Value Date   TSH 2.42 03/25/2014   T3TOTAL 271.2* 12/15/2007   T4TOTAL 13.9* 12/15/2007      Assessment & Plan:  Post-I-131 hypothyroidism: well-replaced.  Patient is advised the following: Patient Instructions  blood tests are being requested for you today.  We'll contact you with results. Please return in 1 year. please call [phone] ([place] physician referral line), to get an appointment with a new primary doctor

## 2014-03-25 NOTE — Patient Instructions (Signed)
blood tests are being requested for you today.  We'll contact you with results. Please return in 1 year. please call [phone] ( physician referral line), to get an appointment with a new primary doctor

## 2014-04-08 ENCOUNTER — Other Ambulatory Visit: Payer: Commercial Managed Care - HMO

## 2014-04-08 DIAGNOSIS — Z79899 Other long term (current) drug therapy: Secondary | ICD-10-CM

## 2014-04-08 DIAGNOSIS — Z113 Encounter for screening for infections with a predominantly sexual mode of transmission: Secondary | ICD-10-CM

## 2014-04-08 DIAGNOSIS — B20 Human immunodeficiency virus [HIV] disease: Secondary | ICD-10-CM

## 2014-04-08 LAB — COMPLETE METABOLIC PANEL WITH GFR
ALT: 36 U/L — AB (ref 0–35)
AST: 35 U/L (ref 0–37)
Albumin: 4 g/dL (ref 3.5–5.2)
Alkaline Phosphatase: 91 U/L (ref 39–117)
BILIRUBIN TOTAL: 0.4 mg/dL (ref 0.2–1.2)
BUN: 16 mg/dL (ref 6–23)
CO2: 27 mEq/L (ref 19–32)
CREATININE: 1.41 mg/dL — AB (ref 0.50–1.10)
Calcium: 10.5 mg/dL (ref 8.4–10.5)
Chloride: 102 mEq/L (ref 96–112)
GFR, EST AFRICAN AMERICAN: 47 mL/min — AB
GFR, Est Non African American: 41 mL/min — ABNORMAL LOW
Glucose, Bld: 101 mg/dL — ABNORMAL HIGH (ref 70–99)
Potassium: 4.3 mEq/L (ref 3.5–5.3)
Sodium: 139 mEq/L (ref 135–145)
TOTAL PROTEIN: 7.6 g/dL (ref 6.0–8.3)

## 2014-04-08 LAB — CBC WITH DIFFERENTIAL/PLATELET
BASOS ABS: 0 10*3/uL (ref 0.0–0.1)
Basophils Relative: 1 % (ref 0–1)
EOS ABS: 0.1 10*3/uL (ref 0.0–0.7)
Eosinophils Relative: 3 % (ref 0–5)
HCT: 41.7 % (ref 36.0–46.0)
Hemoglobin: 15.3 g/dL — ABNORMAL HIGH (ref 12.0–15.0)
LYMPHS PCT: 45 % (ref 12–46)
Lymphs Abs: 1.9 10*3/uL (ref 0.7–4.0)
MCH: 33.9 pg (ref 26.0–34.0)
MCHC: 36.7 g/dL — ABNORMAL HIGH (ref 30.0–36.0)
MCV: 92.5 fL (ref 78.0–100.0)
Monocytes Absolute: 0.4 10*3/uL (ref 0.1–1.0)
Monocytes Relative: 9 % (ref 3–12)
Neutro Abs: 1.8 10*3/uL (ref 1.7–7.7)
Neutrophils Relative %: 42 % — ABNORMAL LOW (ref 43–77)
PLATELETS: 215 10*3/uL (ref 150–400)
RBC: 4.51 MIL/uL (ref 3.87–5.11)
RDW: 14.2 % (ref 11.5–15.5)
WBC: 4.3 10*3/uL (ref 4.0–10.5)

## 2014-04-08 LAB — LIPID PANEL
CHOL/HDL RATIO: 4 ratio
CHOLESTEROL: 203 mg/dL — AB (ref 0–200)
HDL: 51 mg/dL (ref >39)
LDL Cholesterol: 123 mg/dL — ABNORMAL HIGH (ref 0–99)
TRIGLYCERIDES: 143 mg/dL (ref <150)
VLDL: 29 mg/dL (ref 0–40)

## 2014-04-08 NOTE — Addendum Note (Signed)
Addended by: Dolan Amen D on: 04/08/2014 01:33 PM   Modules accepted: Orders

## 2014-04-08 NOTE — Addendum Note (Signed)
Addended by: Dolan Amen D on: 04/08/2014 03:29 PM   Modules accepted: Orders

## 2014-04-09 LAB — RPR

## 2014-04-09 LAB — T-HELPER CELL (CD4) - (RCID CLINIC ONLY)
CD4 % Helper T Cell: 35 % (ref 33–55)
CD4 T Cell Abs: 740 /uL (ref 400–2700)

## 2014-04-09 LAB — HEPATITIS C ANTIBODY: HCV AB: NEGATIVE

## 2014-04-12 LAB — HIV-1 RNA QUANT-NO REFLEX-BLD: HIV-1 RNA Quant, Log: <1.3 {Log} (ref <1.30)

## 2014-04-15 ENCOUNTER — Other Ambulatory Visit: Payer: Self-pay | Admitting: Endocrinology

## 2014-04-21 ENCOUNTER — Ambulatory Visit: Payer: Medicare HMO | Admitting: Infectious Disease

## 2014-04-23 ENCOUNTER — Ambulatory Visit (INDEPENDENT_AMBULATORY_CARE_PROVIDER_SITE_OTHER): Payer: Commercial Managed Care - HMO | Admitting: Infectious Disease

## 2014-04-23 ENCOUNTER — Encounter: Payer: Self-pay | Admitting: Infectious Disease

## 2014-04-23 VITALS — BP 120/87 | HR 75 | Temp 97.9°F | Wt 273.0 lb

## 2014-04-23 DIAGNOSIS — E890 Postprocedural hypothyroidism: Secondary | ICD-10-CM

## 2014-04-23 DIAGNOSIS — I10 Essential (primary) hypertension: Secondary | ICD-10-CM

## 2014-04-23 DIAGNOSIS — B20 Human immunodeficiency virus [HIV] disease: Secondary | ICD-10-CM

## 2014-04-23 DIAGNOSIS — N289 Disorder of kidney and ureter, unspecified: Secondary | ICD-10-CM | POA: Insufficient documentation

## 2014-04-23 DIAGNOSIS — E782 Mixed hyperlipidemia: Secondary | ICD-10-CM

## 2014-04-23 DIAGNOSIS — Z23 Encounter for immunization: Secondary | ICD-10-CM

## 2014-04-23 NOTE — Progress Notes (Signed)
Subjective:    Patient ID: Kayla Price, female    DOB: 1955/07/30, 58 y.o.   MRN: [id number]  HPI   Kayla Price is a 58 y.o. female who is doing superbly well on her  antiviral regimen, of Atripla and Isentress with undetectable viral load and health cd4 count.  she is being seen by Dr. 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 Thyroid function is within range and she is followed closely by Dr. Loanne Drilling   Review of Systems  Constitutional: Negative for fever, chills, diaphoresis, activity change, appetite change, fatigue and unexpected weight change.  HENT: Negative for congestion, rhinorrhea, sinus pressure, sneezing, sore throat and trouble swallowing.   Eyes: Negative for photophobia and visual disturbance.  Respiratory: Negative for cough, chest tightness, shortness of breath, wheezing and stridor.   Cardiovascular: Negative for chest pain, palpitations and leg swelling.  Gastrointestinal: Negative for nausea, vomiting, abdominal pain, diarrhea, constipation, blood in stool, abdominal distention and anal bleeding.  Genitourinary: Negative for dysuria, hematuria, flank pain and difficulty urinating.  Musculoskeletal: Negative for arthralgias, back pain, gait problem, joint swelling and myalgias.  Skin: Positive for color change. Negative for pallor, rash and wound.  Neurological: Negative for dizziness, tremors, weakness and light-headedness.  Hematological: Negative for adenopathy. Does not bruise/bleed easily.  Psychiatric/Behavioral: Negative for  behavioral problems, confusion, sleep disturbance, dysphoric mood, decreased concentration and agitation.       Objective:   Physical Exam  Constitutional: She is oriented to person, place, and time. She appears well-developed and well-nourished. No distress.  HENT:  Head: Normocephalic and atraumatic.    Mouth/Throat: Oropharynx is clear and moist. No oropharyngeal exudate.  Eyes: Conjunctivae and EOM are normal. Pupils are equal, round, and reactive to light. No scleral icterus.  Neck: Normal range of motion. Neck supple. No JVD present.  Cardiovascular: Normal rate, regular rhythm and normal heart sounds.  Exam reveals no gallop and no friction rub.   No murmur heard. Pulmonary/Chest: Effort normal and breath sounds normal. No respiratory distress. She has no wheezes. She has no rales. She exhibits no tenderness.  Abdominal: She exhibits no distension and no mass. There is no tenderness. There is no rebound and no guarding.  Musculoskeletal: She exhibits no edema and no tenderness.  Lymphadenopathy:    She has no cervical adenopathy.  Neurological: She is alert and oriented to person, place, and time. She has normal reflexes. She exhibits normal muscle tone. Coordination normal.  Skin: Skin is warm and dry. She is not diaphoretic. No erythema. No pallor.  Psychiatric: She has a normal mood and affect. Her behavior is normal. Judgment and thought content normal.          Assessment & Plan:  HIV DISEASE  Superbly controlled. Continue atripla and isentress, RTC 6 months we considered a few other options such as daily COMPLERA with daily TIVICAY I spent greater than 25 minutes with the patient including greater than 50% of time in face to face counsel of the patient and in coordination of their care.  HYPERTENSION  bp better controlled  HYPOTHYROIDISM, POST-RADIATION  Followed by Dr. Loanne Drilling,.   Hyperlipidemia: LDL above 100 if she is diabetic now will need add back  statin,  Renal insuffiencey: Check repeat metabolic panel GFR urine sediment and creatinine

## 2014-04-24 LAB — BASIC METABOLIC PANEL WITH GFR
BUN: 19 mg/dL (ref 6–23)
CALCIUM: 9.8 mg/dL (ref 8.4–10.5)
CO2: 20 mEq/L (ref 19–32)
CREATININE: 1.27 mg/dL — AB (ref 0.50–1.10)
Chloride: 102 mEq/L (ref 96–112)
GFR, EST AFRICAN AMERICAN: 54 mL/min — AB
GFR, Est Non African American: 47 mL/min — ABNORMAL LOW
GLUCOSE: 93 mg/dL (ref 70–99)
Potassium: 3.8 mEq/L (ref 3.5–5.3)
Sodium: 138 mEq/L (ref 135–145)

## 2014-04-24 LAB — SODIUM, URINE, RANDOM: Sodium, Ur: 99 mEq/L

## 2014-04-24 LAB — MICROALBUMIN / CREATININE URINE RATIO
Creatinine, Urine: 169.1 mg/dL
Microalb Creat Ratio: 65.1 mg/g — ABNORMAL HIGH (ref 0.0–30.0)
Microalb, Ur: 11 mg/dL — ABNORMAL HIGH (ref <2.0)

## 2014-05-31 ENCOUNTER — Other Ambulatory Visit: Payer: Self-pay | Admitting: Infectious Disease

## 2014-05-31 DIAGNOSIS — B20 Human immunodeficiency virus [HIV] disease: Secondary | ICD-10-CM

## 2014-06-15 ENCOUNTER — Other Ambulatory Visit: Payer: Self-pay | Admitting: Endocrinology

## 2014-08-06 ENCOUNTER — Other Ambulatory Visit: Payer: Self-pay

## 2014-08-06 DIAGNOSIS — Z1231 Encounter for screening mammogram for malignant neoplasm of breast: Secondary | ICD-10-CM

## 2014-08-11 ENCOUNTER — Other Ambulatory Visit: Payer: Self-pay | Admitting: Endocrinology

## 2014-08-19 ENCOUNTER — Ambulatory Visit
Admission: RE | Admit: 2014-08-19 | Discharge: 2014-08-19 | Disposition: A | Discharge status: Home / Self Care | Payer: Commercial Managed Care - HMO | Source: Ambulatory Visit

## 2014-08-19 DIAGNOSIS — Z1231 Encounter for screening mammogram for malignant neoplasm of breast: Secondary | ICD-10-CM | POA: Diagnosis not present

## 2014-09-09 ENCOUNTER — Other Ambulatory Visit: Payer: Self-pay | Admitting: Infectious Disease

## 2014-09-23 ENCOUNTER — Ambulatory Visit (INDEPENDENT_AMBULATORY_CARE_PROVIDER_SITE_OTHER): Payer: Commercial Managed Care - HMO | Admitting: Nurse Practitioner

## 2014-09-23 ENCOUNTER — Encounter: Payer: Self-pay | Admitting: Nurse Practitioner

## 2014-09-23 VITALS — BP 130/90 | HR 77 | Temp 97.4°F | Resp 20 | Ht 66.34 in | Wt 272.6 lb

## 2014-09-23 DIAGNOSIS — R739 Hyperglycemia, unspecified: Secondary | ICD-10-CM

## 2014-09-23 DIAGNOSIS — I10 Essential (primary) hypertension: Secondary | ICD-10-CM

## 2014-09-23 DIAGNOSIS — H109 Unspecified conjunctivitis: Secondary | ICD-10-CM | POA: Diagnosis not present

## 2014-09-23 DIAGNOSIS — E890 Postprocedural hypothyroidism: Secondary | ICD-10-CM | POA: Diagnosis not present

## 2014-09-23 DIAGNOSIS — E559 Vitamin D deficiency, unspecified: Secondary | ICD-10-CM

## 2014-09-23 DIAGNOSIS — E782 Mixed hyperlipidemia: Secondary | ICD-10-CM | POA: Diagnosis not present

## 2014-09-23 MED ORDER — POLYMYXIN B-TRIMETHOPRIM 10000-0.1 UNIT/ML-% OP SOLN: 1.0000 [drp] | OPHTHALMIC | Status: DC

## 2014-09-23 NOTE — Patient Instructions (Signed)
Eye Drops prescribed due to eye infection-- take these every 4 hours while awake  Follow up in 4 weeks for EV with fasting lab work prior to appt

## 2014-09-23 NOTE — Progress Notes (Signed)
Patient ID: Kayla Price, female   DOB: [birth year], 59 y.o.   MRN: [id number]    PCP: Pcp Not In System  Allergies  Allergen Reactions  . Lisinopril Swelling    Swelling of tongue and mouth    Chief Complaint  Patient presents with  . Establish Care     HPI: Patient is a 59 y.o. female seen in the office today to establish care, has not had a PCP in a long time.  Currently seeing Dr Tommy Medal and Dr Loanne Drilling routinely.  Insurance recommended [place] due to her going to the Urgent care and ED when she needed to see a provider.  Eyes are very red and matted. Thought it was allergies No coughing or sneezing. No chest or nasal congestion.  Red for 2 weeks. Hx of dry eye syndrome, has producer due to blocked tear ducks. Very crusted in the morning No blurred vision or changes in vision  Hx of bells palsy to right side of her face, still has side effects  Advanced Directive information Does patient have an advance directive?: No, Would patient like information on creating an advanced directive?: Yes - Educational materials given Review of Systems:  Review of Systems  Constitutional: Negative for fever, chills, activity change, appetite change, fatigue and unexpected weight change.  HENT: Positive for postnasal drip. Negative for congestion, ear discharge, ear pain, hearing loss, rhinorrhea, sinus pressure and sore throat.   Eyes: Positive for discharge and redness. Negative for photophobia, pain, itching and visual disturbance.  Respiratory: Negative for cough and shortness of breath.   Cardiovascular: Negative for chest pain, palpitations and leg swelling.  Gastrointestinal: Negative for abdominal pain, diarrhea and constipation.  Genitourinary: Positive for frequency (chronic frequency, on HTCZ). Negative for dysuria and difficulty urinating.  Musculoskeletal: Positive for arthralgias (occasionally, L knee). Negative for myalgias.       Hx of fall in 2009, fx right hip which will  cause her some discomfort if the weather is cold or rainy  Skin: Negative for color change and wound.  Allergic/Immunologic: Negative for environmental allergies.  Neurological: Negative for dizziness and weakness.  Psychiatric/Behavioral: Negative for behavioral problems, confusion and agitation. The patient is not nervous/anxious.     Past Medical History  Diagnosis Date  . HIV DISEASE 03/27/2006  . 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 Unspecified vitamin D deficiency 08/06/2007  . HYPERLIPIDEMIA, MIXED 12/15/2007  . HYPERTENSION 03/27/2006  . PVD 04/07/2007  . Alopecia areata 11/28/2009  . MENORRHAGIA, POSTMENOPAUSAL 02/03/2009  . TRIGGER FINGER 05/06/2008  . HIP FRACTURE, RIGHT 05/06/2008  . BREAST CANCER, HX OF 03/27/2006  . OSTEOARTHROSIS, LOCAL, SCND, UNSPC SITE 04/07/2007  . Fasting hyperglycemia   . Tinea capitis   . HIV (human immunodeficiency virus infection)   . Dry eye syndrome   . Bell's palsy    Past Surgical History  Procedure Laterality Date  . Tubal ligation    . Breast lumpectomy    . Lymph node dissection  2005  . Port-a-cath removal  2006    insertion 2005  . Hysteroscopy  2006  . Thyroid surgery  2009    Ablation   . Colonoscopy     Social History:   reports that she has never smoked. She has never used smokeless tobacco. She reports that she does not drink alcohol or use illicit drugs.  Family History  Problem Relation Age of Onset  . Heart attack Brother     Massive MI in 3s  . Stroke Brother  CAD  . Kidney disease Mother   . Liver disease Sister   . COPD Sister     had I-131 rx of hyperthyroidism  . Diabetes Sister   . Cancer Paternal Uncle   . Cancer Cousin   . Heart failure Father   . Stroke Sister   . Cancer Sister   . Arthritis Sister   . Sarcoidosis Sister     Medications: Patient's Medications  New Prescriptions   No medications on file  Previous Medications   B COMPLEX-C (SUPER B COMPLEX PO)    Take 1 tablet by mouth  daily.   BENICAR HCT 40-25 MG PER TABLET    TAKE 1 TABLET BY MOUTH EVERY DAY   BIOTIN 5000 MCG CAPS    Take 5,000 mcg by mouth daily.   CHLORPHEN-PSEUDOEPHED-APAP (CORICIDIN D PO)    Take by mouth.   CHOLECALCIFEROL (D3 ADULT PO)    Take 2,000 Units by mouth daily.   CHROMIUM PICOLINATE 800 MCG TABS    Take 800 mcg by mouth daily.   EFAVIRENZ-EMTRICITABINE-TENOFOVIR (ATRIPLA) 600-200-300 MG PER TABLET    Take 1 tablet by mouth at bedtime.   EVENING PRIMROSE OIL 1000 MG CAPS    Take 1,000 capsules by mouth daily.   ISENTRESS 400 MG TABLET    TAKE 1 TABLET (400 MG TOTAL) BY MOUTH 2 (TWO) TIMES DAILY.   LEVOTHYROXINE (SYNTHROID, LEVOTHROID) 150 MCG TABLET    TAKE 1 TABLET (150 MCG TOTAL) BY MOUTH DAILY.   LORATADINE (CLARITIN) 10 MG TABLET    Take 10 mg by mouth daily as needed for itching.   NAPROXEN SODIUM (ANAPROX) 220 MG TABLET    Take 220-440 mg by mouth daily as needed (pain).    OMEGA-3 FATTY ACIDS (FISH OIL) 1000 MG CAPS    Take 1,000 mg by mouth daily.   POTASSIUM GLUCONATE PO    Take 1 tablet by mouth daily. 595 mg   PROBIOTIC PRODUCT (PROBIOTIC COMPLEX ACIDOPHILUS) CAPS    Take 1 capsule by mouth daily.   VITAMIN C (ASCORBIC ACID) 250 MG TABLET    Take 250 mg by mouth daily.   VITAMIN E PO    Take 1 tablet by mouth daily. 400 units  Modified Medications   No medications on file  Discontinued Medications   ALBUTEROL (PROVENTIL HFA;VENTOLIN HFA) 108 (90 BASE) MCG/ACT INHALER    Inhale 2 puffs into the lungs every 4 (four) hours as needed for wheezing or shortness of breath.   MULTIPLE VITAMINS-MINERALS (WOMENS 50+ MULTI VITAMIN/MIN PO)    Take 1 tablet by mouth daily.   OVER THE COUNTER MEDICATION    Take 1 tablet by mouth daily. For joint health   PSEUDOEPHEDRINE (SUDAFED) 30 MG TABLET    Take 1 tablet (30 mg total) by mouth every 4 (four) hours as needed for congestion.     Physical Exam:  Filed Vitals:   09/23/14 0849  BP: 130/90  Pulse: 77  Temp: 97.4 F (36.3 C)    TempSrc: Oral  Resp: 20  Height: 5' 6.34" (1.685 m)  Weight: 272 lb 9.6 oz (123.651 kg)  SpO2: 96%    Physical Exam  Constitutional: She is oriented to person, place, and time. She appears well-developed and well-nourished. No distress.  HENT:  Head: Normocephalic and atraumatic.  Right Ear: External ear normal.  Left Ear: External ear normal.  Nose: Nose normal.  Mouth/Throat: Oropharynx is clear and moist. No oropharyngeal exudate.  Eyes: Pupils are equal, round,  and reactive to light. Right conjunctiva is injected. Left conjunctiva is injected.  Neck: Normal range of motion. Neck supple.  Cardiovascular: Normal rate, regular rhythm and normal heart sounds.   Pulmonary/Chest: Effort normal and breath sounds normal.  Abdominal: Soft. Bowel sounds are normal. She exhibits no distension. There is no tenderness.  Musculoskeletal: She exhibits no edema or tenderness.  Neurological: She is alert and oriented to person, place, and time.  Skin: Skin is warm and dry. She is not diaphoretic.  Psychiatric: She has a normal mood and affect.    Labs reviewed: Basic Metabolic Panel:  Recent Labs  03/25/14 0932 04/08/14 0933 04/23/14 0926  NA  --  139 138  K  --  4.3 3.8  CL  --  102 102  CO2  --  27 20  GLUCOSE  --  101* 93  BUN  --  16 19  CREATININE  --  1.41* 1.27*  CALCIUM  --  10.5 9.8  TSH 2.42  --   --    Liver Function Tests:  Recent Labs  04/08/14 0933  AST 35  ALT 36*  ALKPHOS 91  BILITOT 0.4  PROT 7.6  ALBUMIN 4.0   No results for input(s): LIPASE, AMYLASE in the last 8760 hours. No results for input(s): AMMONIA in the last 8760 hours. CBC:  Recent Labs  04/08/14 0933  WBC 4.3  NEUTROABS 1.8  HGB 15.3*  HCT 41.7  MCV 92.5  PLT 215   Lipid Panel:  Recent Labs  04/08/14 0933  CHOL 203*  HDL 51  LDLCALC 123*  TRIG 143  CHOLHDL 4.0   TSH:  Recent Labs  03/25/14 0932  TSH 2.42   A1C: Lab Results  Component Value Date   HGBA1C 5.9*  03/09/2013     Assessment/Plan 1. Bilateral conjunctivitis - trimethoprim-polymyxin b (POLYTRIM) ophthalmic solution; Place 1 drop into both eyes every 4 (four) hours. For 7 days  Dispense: 10 mL; Refill: 0 -return precautions discussed   2. Essential hypertension -conts on benicar hct - Comprehensive metabolic panel; Future  3. HYPERLIPIDEMIA, MIXED -off medications at this time, LDL elevated in October, will follow up lipids  - Comprehensive metabolic panel; Future - Lipid panel; Future  4. Postablative hypothyroidism TSH stable in October 2015, following with endocine conts on synthroid 150 mcg  5. Vitamin D deficiency -currently on vit d 2000 units, reports hx of def will follow up level before next visit - Vitamin D, 25-hydroxy; Future  6. Hyperglycemia -lifestyle modifications, hx of gestational diabetes, follow up  - Hemoglobin A1c; Future  Follow up in 4 weeks for EV with MMSE sooner if needed

## 2014-10-05 ENCOUNTER — Other Ambulatory Visit: Payer: Commercial Managed Care - HMO

## 2014-10-05 DIAGNOSIS — B20 Human immunodeficiency virus [HIV] disease: Secondary | ICD-10-CM

## 2014-10-05 LAB — CBC WITH DIFFERENTIAL/PLATELET
BASOS ABS: 0 10*3/uL (ref 0.0–0.1)
Basophils Relative: 1 % (ref 0–1)
Eosinophils Absolute: 0.1 10*3/uL (ref 0.0–0.7)
Eosinophils Relative: 3 % (ref 0–5)
HCT: 44.4 % (ref 36.0–46.0)
HEMOGLOBIN: 15.6 g/dL — AB (ref 12.0–15.0)
Lymphocytes Relative: 42 % (ref 12–46)
Lymphs Abs: 1.8 10*3/uL (ref 0.7–4.0)
MCH: 33.7 pg (ref 26.0–34.0)
MCHC: 35.1 g/dL (ref 30.0–36.0)
MCV: 95.9 fL (ref 78.0–100.0)
MONO ABS: 0.4 10*3/uL (ref 0.1–1.0)
MONOS PCT: 8 % (ref 3–12)
MPV: 9.8 fL (ref 8.6–12.4)
NEUTROS ABS: 2 10*3/uL (ref 1.7–7.7)
Neutrophils Relative %: 46 % (ref 43–77)
PLATELETS: 211 10*3/uL (ref 150–400)
RBC: 4.63 MIL/uL (ref 3.87–5.11)
RDW: 13.8 % (ref 11.5–15.5)
WBC: 4.4 10*3/uL (ref 4.0–10.5)

## 2014-10-06 LAB — HIV-1 RNA QUANT-NO REFLEX-BLD
HIV 1 RNA QUANT: 53 {copies}/mL — AB (ref <20)
HIV-1 RNA Quant, Log: 1.72 {Log} — ABNORMAL HIGH (ref <1.30)

## 2014-10-06 LAB — T-HELPER CELL (CD4) - (RCID CLINIC ONLY)
CD4 T CELL HELPER: 36 % (ref 33–55)
CD4 T Cell Abs: 670 /uL (ref 400–2700)

## 2014-10-10 ENCOUNTER — Other Ambulatory Visit: Payer: Self-pay | Admitting: Endocrinology

## 2014-10-15 ENCOUNTER — Other Ambulatory Visit: Payer: Commercial Managed Care - HMO

## 2014-10-15 ENCOUNTER — Other Ambulatory Visit: Payer: Self-pay | Admitting: *Deleted

## 2014-10-15 DIAGNOSIS — E559 Vitamin D deficiency, unspecified: Secondary | ICD-10-CM | POA: Diagnosis not present

## 2014-10-15 DIAGNOSIS — E782 Mixed hyperlipidemia: Secondary | ICD-10-CM | POA: Diagnosis not present

## 2014-10-15 DIAGNOSIS — R739 Hyperglycemia, unspecified: Secondary | ICD-10-CM

## 2014-10-15 DIAGNOSIS — B20 Human immunodeficiency virus [HIV] disease: Secondary | ICD-10-CM | POA: Diagnosis not present

## 2014-10-15 DIAGNOSIS — I10 Essential (primary) hypertension: Secondary | ICD-10-CM

## 2014-10-16 LAB — MICROALBUMIN / CREATININE URINE RATIO
Creatinine, Urine: 126.3 mg/dL (ref 15.0–278.0)
MICROALB/CREAT RATIO: 42.8 mg/g creat — ABNORMAL HIGH (ref 0.0–30.0)
MICROALBUM., U, RANDOM: 54 ug/mL — AB (ref 0.0–17.0)

## 2014-10-16 LAB — COMPREHENSIVE METABOLIC PANEL
ALBUMIN: 4.3 g/dL (ref 3.5–5.5)
ALK PHOS: 127 IU/L — AB (ref 39–117)
ALT: 51 IU/L — ABNORMAL HIGH (ref 0–32)
AST: 48 IU/L — ABNORMAL HIGH (ref 0–40)
Albumin/Globulin Ratio: 1.3 (ref 1.1–2.5)
BUN / CREAT RATIO: 15 (ref 9–23)
BUN: 18 mg/dL (ref 6–24)
Bilirubin Total: <0.2 mg/dL (ref 0.0–1.2)
CALCIUM: 10.5 mg/dL — AB (ref 8.7–10.2)
CO2: 25 mmol/L (ref 18–29)
Chloride: 102 mmol/L (ref 97–108)
Creatinine, Ser: 1.21 mg/dL — ABNORMAL HIGH (ref 0.57–1.00)
GFR calc Af Amer: 57 mL/min/{1.73_m2} — ABNORMAL LOW (ref >59)
GFR, EST NON AFRICAN AMERICAN: 49 mL/min/{1.73_m2} — AB (ref >59)
GLOBULIN, TOTAL: 3.3 g/dL (ref 1.5–4.5)
Glucose: 122 mg/dL — ABNORMAL HIGH (ref 65–99)
Potassium: 4.7 mmol/L (ref 3.5–5.2)
Sodium: 146 mmol/L — ABNORMAL HIGH (ref 134–144)
Total Protein: 7.6 g/dL (ref 6.0–8.5)

## 2014-10-16 LAB — HEMOGLOBIN A1C
Est. average glucose Bld gHb Est-mCnc: 117 mg/dL
Hgb A1c MFr Bld: 5.7 % — ABNORMAL HIGH (ref 4.8–5.6)

## 2014-10-16 LAB — LIPID PANEL
CHOL/HDL RATIO: 2.9 ratio (ref 0.0–4.4)
CHOLESTEROL TOTAL: 191 mg/dL (ref 100–199)
HDL: 65 mg/dL (ref >39)
LDL CALC: 99 mg/dL (ref 0–99)
Triglycerides: 134 mg/dL (ref 0–149)
VLDL Cholesterol Cal: 27 mg/dL (ref 5–40)

## 2014-10-16 LAB — VITAMIN D 25 HYDROXY (VIT D DEFICIENCY, FRACTURES): Vit D, 25-Hydroxy: 32.9 ng/mL (ref 30.0–100.0)

## 2014-10-19 ENCOUNTER — Ambulatory Visit (INDEPENDENT_AMBULATORY_CARE_PROVIDER_SITE_OTHER): Payer: Commercial Managed Care - HMO | Admitting: Nurse Practitioner

## 2014-10-19 ENCOUNTER — Encounter: Payer: Self-pay | Admitting: Nurse Practitioner

## 2014-10-19 VITALS — BP 138/80 | HR 78 | Temp 97.9°F | Resp 20 | Ht 66.0 in | Wt 267.2 lb

## 2014-10-19 DIAGNOSIS — R739 Hyperglycemia, unspecified: Secondary | ICD-10-CM | POA: Diagnosis not present

## 2014-10-19 DIAGNOSIS — M170 Bilateral primary osteoarthritis of knee: Secondary | ICD-10-CM | POA: Diagnosis not present

## 2014-10-19 DIAGNOSIS — N183 Chronic kidney disease, stage 3 unspecified: Secondary | ICD-10-CM

## 2014-10-19 DIAGNOSIS — K0889 Other specified disorders of teeth and supporting structures: Secondary | ICD-10-CM

## 2014-10-19 DIAGNOSIS — I10 Essential (primary) hypertension: Secondary | ICD-10-CM | POA: Diagnosis not present

## 2014-10-19 DIAGNOSIS — H109 Unspecified conjunctivitis: Secondary | ICD-10-CM | POA: Diagnosis not present

## 2014-10-19 DIAGNOSIS — E782 Mixed hyperlipidemia: Secondary | ICD-10-CM | POA: Diagnosis not present

## 2014-10-19 DIAGNOSIS — K088 Other specified disorders of teeth and supporting structures: Secondary | ICD-10-CM

## 2014-10-19 MED ORDER — TRAMADOL HCL 50 MG PO TABS: 50.0000 mg | ORAL_TABLET | Freq: Three times a day (TID) | ORAL | Status: DC | PRN

## 2014-10-19 NOTE — Progress Notes (Signed)
Patient ID: Kayla Price, female   DOB: 11-Jan-1956, 59 y.o.   MRN: [id number]    PCP: Lauree Chandler, NP  Allergies  Allergen Reactions  . Lisinopril Swelling    Swelling of tongue and mouth    Chief Complaint  Patient presents with  . Medical Management of Chronic Issues     HPI: Patient is a 59 y.o. female seen in the office today for follow up.  Stopped calcium and face is feeling better.  Went to dentist today, has a hole in one of her teeth that is causing her a lot of pain. Can not have procedure to fix the problem for another week.  Eyes are much better after drops Seeing ID next week for HIV  Bilateral knee pain, has been using aleve as needed   Review of Systems:  Review of Systems  Constitutional: Negative for fever, chills, activity change, appetite change, fatigue and unexpected weight change.  HENT: Negative for congestion, ear discharge, ear pain, hearing loss, postnasal drip, rhinorrhea, sinus pressure and sore throat.   Eyes: Negative.  Negative for photophobia, pain, discharge, redness, itching and visual disturbance.  Respiratory: Negative for cough and shortness of breath.   Cardiovascular: Negative for chest pain, palpitations and leg swelling.  Gastrointestinal: Negative for abdominal pain, diarrhea and constipation.  Genitourinary: Positive for frequency (chronic frequency, on HTCZ). Negative for dysuria and difficulty urinating.  Musculoskeletal: Positive for arthralgias (occasionally, L knee). Negative for myalgias.  Skin: Negative for color change and wound.  Allergic/Immunologic: Negative for environmental allergies.  Neurological: Negative for dizziness and weakness.  Psychiatric/Behavioral: Negative for behavioral problems, confusion and agitation. The patient is not nervous/anxious.     Past Medical History  Diagnosis Date  . HIV DISEASE 03/27/2006  . 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 Unspecified vitamin D deficiency 08/06/2007    . HYPERLIPIDEMIA, MIXED 12/15/2007  . HYPERTENSION 03/27/2006  . PVD 04/07/2007  . Alopecia areata 11/28/2009  . MENORRHAGIA, POSTMENOPAUSAL 02/03/2009  . TRIGGER FINGER 05/06/2008  . HIP FRACTURE, RIGHT 05/06/2008  . BREAST CANCER, HX OF 03/27/2006  . OSTEOARTHROSIS, LOCAL, SCND, UNSPC SITE 04/07/2007  . Fasting hyperglycemia   . Tinea capitis   . HIV (human immunodeficiency virus infection)   . Dry eye syndrome   . Bell's palsy    Past Surgical History  Procedure Laterality Date  . Tubal ligation    . Breast lumpectomy    . Lymph node dissection  2005  . Port-a-cath removal  2006    insertion 2005  . Hysteroscopy  2006  . Thyroid surgery  2009    Ablation   . Colonoscopy     Social History:   reports that she has never smoked. She has never used smokeless tobacco. She reports that she does not drink alcohol or use illicit drugs.  Family History  Problem Relation Age of Onset  . Heart attack Brother     Massive MI in 89s  . Stroke Brother     CAD  . Kidney disease Mother   . Stroke Mother   . Diabetes Mother   . Liver disease Sister   . COPD Sister     had I-131 rx of hyperthyroidism  . Diabetes Sister   . Stroke Sister   . Cancer Paternal Uncle   . Cancer Cousin   . Heart failure Father   . Heart disease Father   . Arthritis Father   . Sarcoidosis Sister     Medications: Patient's Medications  New Prescriptions   No medications on file  Previous Medications   B COMPLEX-C (SUPER B COMPLEX PO)    Take 1 tablet by mouth daily.   BENICAR HCT 40-25 MG PER TABLET    TAKE 1 TABLET BY MOUTH EVERY DAY   BIOTIN 5000 MCG CAPS    Take 5,000 mcg by mouth daily.   CHLORPHEN-PSEUDOEPHED-APAP (CORICIDIN D PO)    Take by mouth. As needed   CHOLECALCIFEROL (D3 ADULT PO)    Take 2,000 Units by mouth daily.   CHROMIUM PICOLINATE 800 MCG TABS    Take 800 mcg by mouth daily.   EFAVIRENZ-EMTRICITABINE-TENOFOVIR (ATRIPLA) 600-200-300 MG PER TABLET    Take 1 tablet by mouth at  bedtime.   EVENING PRIMROSE OIL 1000 MG CAPS    Take 1,000 capsules by mouth daily.   ISENTRESS 400 MG TABLET    TAKE 1 TABLET (400 MG TOTAL) BY MOUTH 2 (TWO) TIMES DAILY.   LEVOTHYROXINE (SYNTHROID, LEVOTHROID) 150 MCG TABLET    TAKE 1 TABLET (150 MCG TOTAL) BY MOUTH DAILY.   LORATADINE (CLARITIN) 10 MG TABLET    Take 10 mg by mouth daily as needed for itching.   OMEGA-3 FATTY ACIDS (FISH OIL) 1000 MG CAPS    Take 1,000 mg by mouth daily.   POTASSIUM GLUCONATE PO    Take 1 tablet by mouth daily. 595 mg   PROBIOTIC PRODUCT (PROBIOTIC COMPLEX ACIDOPHILUS) CAPS    Take 1 capsule by mouth daily.   VITAMIN E PO    Take 1 tablet by mouth daily. 400 units  Modified Medications   No medications on file  Discontinued Medications   TRIMETHOPRIM-POLYMYXIN B (POLYTRIM) OPHTHALMIC SOLUTION    Place 1 drop into both eyes every 4 (four) hours. For 7 days     Physical Exam:  Filed Vitals:   10/19/14 0927  BP: 138/80  Pulse: 78  Temp: 97.9 F (36.6 C)  TempSrc: Oral  Resp: 20  Height: '5\' 6"'$  (1.676 m)  Weight: 267 lb 3.2 oz (121.201 kg)  SpO2: 99%    Physical Exam  Constitutional: She is oriented to person, place, and time. She appears well-developed and well-nourished. No distress.  HENT:  Head: Normocephalic and atraumatic.  Right Ear: External ear normal.  Left Ear: External ear normal.  Nose: Nose normal.  Mouth/Throat: Oropharynx is clear and moist. No oropharyngeal exudate.  Eyes: Conjunctivae are normal. Pupils are equal, round, and reactive to light.  Neck: Normal range of motion. Neck supple.  Cardiovascular: Normal rate, regular rhythm and normal heart sounds.   Pulmonary/Chest: Effort normal and breath sounds normal.  Abdominal: Soft. Bowel sounds are normal. She exhibits no distension. There is no tenderness.  Musculoskeletal: She exhibits no edema or tenderness.  Neurological: She is alert and oriented to person, place, and time.  Skin: Skin is warm and dry. She is not  diaphoretic.  Psychiatric: She has a normal mood and affect.    Labs reviewed: Basic Metabolic Panel:  Recent Labs  03/25/14 0932 04/08/14 0933 04/23/14 0926 10/15/14 1007  NA  --  139 138 146*  K  --  4.3 3.8 4.7  CL  --  102 102 102  CO2  --  '27 20 25  '$ GLUCOSE  --  101* 93 122*  BUN  --  '16 19 18  '$ CREATININE  --  1.41* 1.27* 1.21*  CALCIUM  --  10.5 9.8 10.5*  TSH 2.42  --   --   --  Liver Function Tests:  Recent Labs  04/08/14 0933 10/15/14 1007  AST 35 48*  ALT 36* 51*  ALKPHOS 91 127*  BILITOT 0.4 <0.2  PROT 7.6 7.6  ALBUMIN 4.0  --    No results for input(s): LIPASE, AMYLASE in the last 8760 hours. No results for input(s): AMMONIA in the last 8760 hours. CBC:  Recent Labs  04/08/14 0933 10/05/14 1019  WBC 4.3 4.4  NEUTROABS 1.8 2.0  HGB 15.3* 15.6*  HCT 41.7 44.4  MCV 92.5 95.9  PLT 215 211   Lipid Panel:  Recent Labs  04/08/14 0933 10/15/14 1007  CHOL 203* 191  HDL 51 65  LDLCALC 123* 99  TRIG 143 134  CHOLHDL 4.0 2.9   TSH:  Recent Labs  03/25/14 0932  TSH 2.42   A1C: Lab Results  Component Value Date   HGBA1C 5.7* 10/15/2014     Assessment/Plan 1. Bilateral conjunctivitis -resolved  2. Essential hypertension Stable at this time, will not change medications at this time.   3. HYPERLIPIDEMIA, MIXED -pt with hx of Statin use, no longer on medications -to cont lifestyle modifications   4. Hyperglycemia A1c reviewed with pt, diet modifications and exercise discussed   5. Tooth pain -acute tooth pain being managed by dentist, procedure scheduled for next week, dentist advised her to take antiinflammatory for pain, discussed the risk vs benefit with this due to CKD, to only use aleve as needed and not to use past 7 days  6. CKD (chronic kidney disease) stage 3, GFR 30-59 ml/min - urine micro elevated, currently on ARB -most recent Cr at 1.2 which appears to be baseline  7. Osteoarthritis of both knees, unspecified  osteoarthritis type -advised not to use aleve due to side effects -increased knee pain at times, may use topical rubs (biofreeze) OTC as needed, if unrelieved may use ultram as Rx - traMADol (ULTRAM) 50 MG tablet; Take 1 tablet (50 mg total) by mouth every 8 (eight) hours as needed.  Dispense: 90 tablet; Refill: 3  To follow up in 1 month for EV

## 2014-10-19 NOTE — Patient Instructions (Signed)
Use Naproxen 220 by mouth twice daily as needed for tooth pain only for 1 week then STOP  May use Ultram 50 mg every 8 hours as needed for pain   biofreeze to knees as needed for pain  Benefiber which is over the counter into 6 oz fluid daily for bowels Make sure you have good water intake  Follow up in 4-6 weeks for Physical

## 2014-10-20 ENCOUNTER — Ambulatory Visit: Payer: Commercial Managed Care - HMO | Admitting: Infectious Disease

## 2014-10-27 ENCOUNTER — Ambulatory Visit (INDEPENDENT_AMBULATORY_CARE_PROVIDER_SITE_OTHER): Payer: Commercial Managed Care - HMO | Admitting: Infectious Disease

## 2014-10-27 ENCOUNTER — Encounter: Payer: Self-pay | Admitting: Infectious Disease

## 2014-10-27 VITALS — BP 120/86 | HR 66 | Temp 98.0°F | Wt 267.0 lb

## 2014-10-27 DIAGNOSIS — E782 Mixed hyperlipidemia: Secondary | ICD-10-CM | POA: Diagnosis not present

## 2014-10-27 DIAGNOSIS — I10 Essential (primary) hypertension: Secondary | ICD-10-CM

## 2014-10-27 DIAGNOSIS — E890 Postprocedural hypothyroidism: Secondary | ICD-10-CM

## 2014-10-27 DIAGNOSIS — B20 Human immunodeficiency virus [HIV] disease: Secondary | ICD-10-CM

## 2014-10-27 MED ORDER — RILPIVIRINE HCL 25 MG PO TABS
25.0000 mg | ORAL_TABLET | Freq: Every day | ORAL | Status: DC
Start: 2014-10-27 — End: 2015-10-25

## 2014-10-27 MED ORDER — DOLUTEGRAVIR SODIUM 50 MG PO TABS: 50.0000 mg | ORAL_TABLET | Freq: Every day | ORAL | Status: DC

## 2014-10-27 MED ORDER — DARUNAVIR-COBICISTAT 800-150 MG PO TABS: 1.0000 | ORAL_TABLET | Freq: Every day | ORAL | Status: DC

## 2014-10-27 NOTE — Progress Notes (Signed)
Patient ID: Kayla Price, female   DOB: 05-07-1956, 59 y.o.   MRN: [id number] HPI: Kayla Price is a 59 y.o. female who is here for her f/u of HIV.   Allergies: Allergies  Allergen Reactions  . Lisinopril Swelling    Swelling of tongue and mouth    Vitals: Temp: 98 F (36.7 C) (05/04 0930) Temp Source: Oral (05/04 0930) BP: 120/86 mmHg (05/04 0930) Pulse Rate: 66 (05/04 0930)  Past Medical History: Past Medical History  Diagnosis Date  . HIV DISEASE 03/27/2006  . 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 Unspecified vitamin D deficiency 08/06/2007  . HYPERLIPIDEMIA, MIXED 12/15/2007  . HYPERTENSION 03/27/2006  . PVD 04/07/2007  . Alopecia areata 11/28/2009  . MENORRHAGIA, POSTMENOPAUSAL 02/03/2009  . TRIGGER FINGER 05/06/2008  . HIP FRACTURE, RIGHT 05/06/2008  . BREAST CANCER, HX OF 03/27/2006  . OSTEOARTHROSIS, LOCAL, SCND, UNSPC SITE 04/07/2007  . Fasting hyperglycemia   . Tinea capitis   . HIV (human immunodeficiency virus infection)   . Dry eye syndrome   . Bell's palsy     Social History: History   Social History  . Marital Status: Widowed    Spouse Name: N/A  . Number of Children: N/A  . Years of Education: N/A   Occupational History  . Tour manager    Social History Main Topics  . Smoking status: Never Smoker   . Smokeless tobacco: Never Used  . Alcohol Use: No  . Drug Use: No  . Sexual Activity: Not Currently     Comment: declined condoms   Other Topics Concern  . Not on file   Social History Narrative   Single/widow   Diet: good   Do you drink/eat things with caffeine? Tea occasionally   Marital status: Widowed  What year were you married? 1993   Do you live in a house, apartment,assisted living, condo,trailer,ect.)? House   Is it one or more stories? Two   How many persons live in your home? 4   Do you have any pets in you home? No   Current or past profession: Tour manager   Do you exercise? Yes  Type&how often: Stationary Bike,  Water Exercise 3-4 x week   Do you have a living will? Yes   Do you have a DNR form? No  If not do you what one?   Do you have signed POA/HPOA forms? No   If so, please bring to your appointment.                   Previous Regimen: She has been on various combination of meds including KLT, CBV, FTC, EFV, TDF  Current Regimen: ATP + RAL  Labs: HIV 1 RNA QUANT (copies/mL)  Date Value  10/05/2014 53*  04/08/2014 <20  09/08/2013 <20   CD4 T CELL ABS (/uL)  Date Value  10/05/2014 670  04/08/2014 740  09/08/2013 810   HEP B S AB (no units)  Date Value  08/19/2006 No   HEPATITIS B SURFACE AG (no units)  Date Value  08/19/2006 No   HCV AB (no units)  Date Value  04/08/2014 NEGATIVE    CrCl: Estimated Creatinine Clearance: 67.2 mL/min (by C-G formula based on Cr of 1.21).  Lipids:    Component Value Date/Time   CHOL 191 10/15/2014 1007   CHOL 203* 04/08/2014 0933   TRIG 134 10/15/2014 1007   HDL 65 10/15/2014 1007   HDL 51 04/08/2014 0933   CHOLHDL 2.9 10/15/2014 1007  CHOLHDL 4.0 04/08/2014 0933   VLDL 29 04/08/2014 0933   LDLCALC 99 10/15/2014 1007   LDLCALC 123* 04/08/2014 0933   HIV Genotype Composite Data Genotype Dates:   Mutations in Bold impact drug susceptibility RT Mutations M41L, D67N, M184V, L210W, T215Y  PI Mutations None  Integrase Mutations None   Interpretation of Genotype Data per [employer] HIV Database Nucleoside RTIs  lamivudine (3TC) High-level resistance abacavir (ABC) High-level resistance zidovudine (AZT) High-level resistance stavudine (D4T) High-level resistance didanosine (DDI) High-level resistance emtricitabine (FTC) High-level resistance tenofovir (TDF) High-level resistance   Non-Nucleoside RTIs  None   Protease Inhibitors  None   Integrase Inhibitors  None   Assessment: 59 yo who is well controlled on her current regimen. After reviewing some of her old mutations that was scanned into epic, she has a  tremendous amount of resistance (see above table). We are surprised that she was undetectable with only 2 active drugs. Her primary was also worrying about her scr. It's 1.21 on 4/22. We are going to change her to a full 3 active drug regimen with Prezcobix, riliperine, and dolutegravir. She is not on any PPIs. Told her to take all of her new HIV meds with dinner and separate out from her MVIs that she takes in the morning. She is excited about the new regimen.   Recommendations: Dc ATP and RAL Start Prezcobix 1 PO qday with supper Start DTG '50mg'$  PO qday with supper Start RPV '25mg'$  PO qday with supper Gave her the copay cards  Wilfred Lacy, PharmD Clinical [facility]ious [facility] 10/27/2014, 10:30 AM

## 2014-10-27 NOTE — Progress Notes (Signed)
Subjective:    Patient ID: Kayla Price, female    DOB: 30-Apr-1956, 59 y.o.   MRN: [id number]  HPI   Kayla Price is a 59 y.o. female who is doing superbly well on her  antiviral regimen, of Atripla and Isentress with undetectable viral load and health cd4 count.  she is being seen by Dr. Loanne Drilling from endocrinology   Today Kayla Price and I did a "background check" on her HIV and pulled in ALL of the R mutations noted in Dr Ruffin Frederick notes and we had neglected to mention a 41L which when added to all of her other nutation is essentially takes out all the and NRTI's  HIVdb: Genotypic Resistance Interpretation Algorithm  Date: 27-Oct-2014 17:15:11 UTC   Drug Resistance Interpretation: PR PI Major Resistance Mutations: None PI Minor Resistance Mutations: None Other Mutations: V77I Protease Inhibitors atazanavir/r (ATV/r) Susceptible darunavir/r (DRV/r) Susceptible fosamprenavir/r (FPV/r) Susceptible indinavir/r (IDV/r) Susceptible lopinavir/r (LPV/r) Susceptible nelfinavir (NFV) Susceptible saquinavir/r (SQV/r) Susceptible tipranavir/r (TPV/r) Susceptible PR Comments  Drug Resistance Interpretation: RT NRTI Resistance Mutations: M41L, D67N, M184V, L210W, T215Y NNRTI Resistance Mutations: None Other Mutations: E44D, V118I Nucleoside RTI lamivudine (3TC) High-level resistance abacavir (ABC) High-level resistance zidovudine (AZT) High-level resistance stavudine (D4T) High-level resistance didanosine (DDI) High-level resistance emtricitabine (FTC) High-level resistance tenofovir (TDF) High-level resistance Non-Nucleoside RTI efavirenz (EFV) Susceptible etravirine (ETR) Susceptible nevirapine (NVP) Susceptible rilpivirine (RPV) Susceptible RT Comments NRTI M41L is a TAM that usually occurs with T215Y. Together, M41L and T215Y confer high-level resistance to AZT and d4T and intermediate-level resistance to ddI, ABC and TDF. However, viruses with M41L + T215Y + M184V  will exhibit intermediate-level resistance to AZT and d4T and low-level resistance to TDF. D67N is a nonpolymorphic TAM associated with low-level resistance to AZT and d4T. When present with other TAMs, it reduces susceptibility to ABC, TDF and ddI. M184V/I cause high-level resistance to 3TC and FTC and low-level resistance to ddI and ABC. However, M184V/I are not contraindications to continued treatment with 3TC or FTC because they increase susceptibility to AZT, TDF and d4T and are associated with clinically significant reductions in HIV-1 replication. In combination with K101E or E138K, M184I synergistically reduces RPV susceptibility. L210W usually occurs in combination with M41L and T215Y. The combination of M41, L210W and T215Y causes high-level resistance to AZT and d4T and intermediate to high-level resistance to ddI, ABC and TDF. T215Y is a TAM which causes intermediate/high-level resistance to AZT and d4T and low-level resistance to ABC, ddI, and TDF. Other E44A/D are minimally polymorphic accessory NRTI-resistance mutations that usually occur with multiple TAMs. V118I is a polymorphic accessory NRTI-resistance mutation that occurs in combination with multiple TAMs.   Given that would like her to be on a more fully active regimen with certainly components that have a higher barrier to resistance we will change her today to Ilona Sorrel and TIvicay once daily with 400 calorie meal and avoidance of H2 blockers, PPI (completely) antacids.   Lab Results  Component Value Date   HIV1RNAQUANT 53* 10/05/2014   Lab Results  Component Value Date   CD4TABS 670 10/05/2014   CD4TABS 740 04/08/2014   CD4TABS 810 09/08/2013      Review of Systems  Constitutional: Negative for fever, chills, diaphoresis, activity change, appetite change, fatigue and unexpected weight change.  HENT: Negative for congestion, rhinorrhea, sinus pressure, sneezing, sore throat and trouble swallowing.   Eyes:  Negative for photophobia and visual disturbance.  Respiratory: Negative for cough, chest tightness, shortness of breath,  wheezing and stridor.   Cardiovascular: Negative for chest pain, palpitations and leg swelling.  Gastrointestinal: Negative for nausea, vomiting, abdominal pain, diarrhea, constipation, blood in stool, abdominal distention and anal bleeding.  Genitourinary: Negative for dysuria, hematuria, flank pain and difficulty urinating.  Musculoskeletal: Negative for myalgias, back pain, joint swelling, arthralgias and gait problem.  Skin: Positive for color change. Negative for pallor, rash and wound.  Neurological: Negative for dizziness, tremors, weakness and light-headedness.  Hematological: Negative for adenopathy. Does not bruise/bleed easily.  Psychiatric/Behavioral: Negative for behavioral problems, confusion, sleep disturbance, dysphoric mood, decreased concentration and agitation.       Objective:   Physical Exam  Constitutional: She is oriented to person, place, and time. She appears well-developed and well-nourished. No distress.  HENT:  Head: Normocephalic and atraumatic.    Mouth/Throat: Oropharynx is clear and moist. No oropharyngeal exudate.  Eyes: Conjunctivae and EOM are normal. Pupils are equal, round, and reactive to light. No scleral icterus.  Neck: Normal range of motion. Neck supple. No JVD present.  Cardiovascular: Normal rate, regular rhythm and normal heart sounds.  Exam reveals no gallop and no friction rub.   No murmur heard. Pulmonary/Chest: Effort normal and breath sounds normal. No respiratory distress. She has no wheezes. She has no rales. She exhibits no tenderness.  Abdominal: She exhibits no distension and no mass. There is no tenderness. There is no rebound and no guarding.  Musculoskeletal: She exhibits no edema or tenderness.  Lymphadenopathy:    She has no cervical adenopathy.  Neurological: She is alert and oriented to person, place, and  time. She has normal reflexes. She exhibits normal muscle tone. Coordination normal.  Skin: Skin is warm and dry. She is not diaphoretic. No erythema. No pallor.  Psychiatric: She has a normal mood and affect. Her behavior is normal. Judgment and thought content normal.          Assessment & Plan:  HIV DISEASE   See above, changing to Tivicay, PREZCOBIX and Edurant with 400 calorie meal that she must chew and that must contain fat she must avoid proton pump inhibitors altogether if she goes on an antacid H2 blocker it will need to be spaced 12 hours apart Tums if taken will need to be spaced out by at least 4 hours..  We  spent greater than 40 minutes with the patient including greater than 50% of time in face to face counsel of the patient (re her prior resistance genotype and new medications that we desire to put her on for better more complete regimen)  and in coordination of their care.   HYPERTENSION : Better controlled Followed by Dr Dewaine Oats  Morbid obesity: to try to cut calories and to followup with Dr. Dewaine Oats  HYPOTHYROIDISM, POST-RADIATION  Followed by Dr. Loanne Drilling,.   Hyperlipidemia: LDL above 100 if she is diabetic now will need add back statin,

## 2014-12-01 ENCOUNTER — Encounter: Payer: Self-pay | Admitting: Infectious Disease

## 2014-12-01 ENCOUNTER — Ambulatory Visit (INDEPENDENT_AMBULATORY_CARE_PROVIDER_SITE_OTHER): Payer: Commercial Managed Care - HMO | Admitting: Infectious Disease

## 2014-12-01 ENCOUNTER — Ambulatory Visit: Payer: Commercial Managed Care - HMO | Admitting: Infectious Disease

## 2014-12-01 VITALS — BP 122/83 | HR 73 | Temp 98.0°F | Wt 264.0 lb

## 2014-12-01 DIAGNOSIS — I10 Essential (primary) hypertension: Secondary | ICD-10-CM | POA: Diagnosis not present

## 2014-12-01 DIAGNOSIS — B20 Human immunodeficiency virus [HIV] disease: Secondary | ICD-10-CM | POA: Diagnosis not present

## 2014-12-01 DIAGNOSIS — J00 Acute nasopharyngitis [common cold]: Secondary | ICD-10-CM

## 2014-12-01 DIAGNOSIS — E782 Mixed hyperlipidemia: Secondary | ICD-10-CM | POA: Diagnosis not present

## 2014-12-01 DIAGNOSIS — E119 Type 2 diabetes mellitus without complications: Secondary | ICD-10-CM

## 2014-12-01 DIAGNOSIS — E890 Postprocedural hypothyroidism: Secondary | ICD-10-CM

## 2014-12-01 LAB — CBC WITH DIFFERENTIAL/PLATELET
BASOS ABS: 0 10*3/uL (ref 0.0–0.1)
Basophils Relative: 0 % (ref 0–1)
EOS ABS: 0.1 10*3/uL (ref 0.0–0.7)
Eosinophils Relative: 3 % (ref 0–5)
HCT: 43.2 % (ref 36.0–46.0)
HEMOGLOBIN: 14.7 g/dL (ref 12.0–15.0)
Lymphocytes Relative: 39 % (ref 12–46)
Lymphs Abs: 1.9 10*3/uL (ref 0.7–4.0)
MCH: 33 pg (ref 26.0–34.0)
MCHC: 34 g/dL (ref 30.0–36.0)
MCV: 97.1 fL (ref 78.0–100.0)
MONOS PCT: 10 % (ref 3–12)
MPV: 10.4 fL (ref 8.6–12.4)
Monocytes Absolute: 0.5 10*3/uL (ref 0.1–1.0)
NEUTROS PCT: 48 % (ref 43–77)
Neutro Abs: 2.4 10*3/uL (ref 1.7–7.7)
Platelets: 205 10*3/uL (ref 150–400)
RBC: 4.45 MIL/uL (ref 3.87–5.11)
RDW: 13.3 % (ref 11.5–15.5)
WBC: 4.9 10*3/uL (ref 4.0–10.5)

## 2014-12-01 LAB — COMPLETE METABOLIC PANEL WITH GFR
ALK PHOS: 97 U/L (ref 39–117)
ALT: 22 U/L (ref 0–35)
AST: 19 U/L (ref 0–37)
Albumin: 3.9 g/dL (ref 3.5–5.2)
BILIRUBIN TOTAL: 0.4 mg/dL (ref 0.2–1.2)
BUN: 24 mg/dL — ABNORMAL HIGH (ref 6–23)
CO2: 27 mEq/L (ref 19–32)
CREATININE: 1.3 mg/dL — AB (ref 0.50–1.10)
Calcium: 10.4 mg/dL (ref 8.4–10.5)
Chloride: 102 mEq/L (ref 96–112)
GFR, Est African American: 52 mL/min — ABNORMAL LOW
GFR, Est Non African American: 45 mL/min — ABNORMAL LOW
GLUCOSE: 100 mg/dL — AB (ref 70–99)
Potassium: 4.5 mEq/L (ref 3.5–5.3)
Sodium: 142 mEq/L (ref 135–145)
Total Protein: 7.7 g/dL (ref 6.0–8.3)

## 2014-12-01 NOTE — Progress Notes (Signed)
Subjective:    Patient ID: Kayla Price, female    DOB: [birth year], 59 y.o.   MRN: [id number]  HPI   Kayla Price is a 59 y.o. female who is doing superbly well on her  antiviral regimen, of Atripla and Isentress with undetectable viral load and health cd4 count.  she is being seen by Dr. Loanne Drilling from endocrinology   At last visit [place] and I reviewed her  HIV and pulled in ALL of the R mutations noted in Dr Ruffin Frederick notes and we had neglected to mention a 41L which when added to all of her other nutation is essentially takes out all the and NRTI's  HIVdb: Genotypic Resistance Interpretation Algorithm  Date: 27-Oct-2014 17:15:11 UTC   Drug Resistance Interpretation: PR PI Major Resistance Mutations: None PI Minor Resistance Mutations: None Other Mutations: V77I Protease Inhibitors atazanavir/r (ATV/r) Susceptible darunavir/r (DRV/r) Susceptible fosamprenavir/r (FPV/r) Susceptible indinavir/r (IDV/r) Susceptible lopinavir/r (LPV/r) Susceptible nelfinavir (NFV) Susceptible saquinavir/r (SQV/r) Susceptible tipranavir/r (TPV/r) Susceptible PR Comments  Drug Resistance Interpretation: RT NRTI Resistance Mutations: M41L, D67N, M184V, L210W, T215Y NNRTI Resistance Mutations: None Other Mutations: E44D, V118I Nucleoside RTI lamivudine (3TC) High-level resistance abacavir (ABC) High-level resistance zidovudine (AZT) High-level resistance stavudine (D4T) High-level resistance didanosine (DDI) High-level resistance emtricitabine (FTC) High-level resistance tenofovir (TDF) High-level resistance Non-Nucleoside RTI efavirenz (EFV) Susceptible etravirine (ETR) Susceptible nevirapine (NVP) Susceptible rilpivirine (RPV) Susceptible RT Comments NRTI M41L is a TAM that usually occurs with T215Y. Together, M41L and T215Y confer high-level resistance to AZT and d4T and intermediate-level resistance to ddI, ABC and TDF. However, viruses with M41L + T215Y + M184V will exhibit  intermediate-level resistance to AZT and d4T and low-level resistance to TDF. D67N is a nonpolymorphic TAM associated with low-level resistance to AZT and d4T. When present with other TAMs, it reduces susceptibility to ABC, TDF and ddI. M184V/I cause high-level resistance to 3TC and FTC and low-level resistance to ddI and ABC. However, M184V/I are not contraindications to continued treatment with 3TC or FTC because they increase susceptibility to AZT, TDF and d4T and are associated with clinically significant reductions in HIV-1 replication. In combination with K101E or E138K, M184I synergistically reduces RPV susceptibility. L210W usually occurs in combination with M41L and T215Y. The combination of M41, L210W and T215Y causes high-level resistance to AZT and d4T and intermediate to high-level resistance to ddI, ABC and TDF. T215Y is a TAM which causes intermediate/high-level resistance to AZT and d4T and low-level resistance to ABC, ddI, and TDF. Other E44A/D are minimally polymorphic accessory NRTI-resistance mutations that usually occur with multiple TAMs. V118I is a polymorphic accessory NRTI-resistance mutation that occurs in combination with multiple TAMs.   Given that we changed her to a more fully active regimen with certainly components that have a higher barrier to resistance we will change her today to Ilona Sorrel and TIvicay once daily with 400 calorie meal and avoidance of H2 blockers, PPI (completely) antacids.   Lab Results  Component Value Date   HIV1RNAQUANT 53* 10/05/2014   Lab Results  Component Value Date   CD4TABS 670 10/05/2014   CD4TABS 740 04/08/2014   CD4TABS 810 09/08/2013   She has tolerated her new regimen with only one loose stool otherwise no problem. Vivid dreams are now gone.  She did have URI and recent symptoms related to that and is taking flonase ONLY with this and similar episodes   Review of Systems  Constitutional: Negative for fever, chills,  diaphoresis, activity change, appetite change, fatigue and unexpected  weight change.  HENT: Positive for postnasal drip, rhinorrhea, sinus pressure and sore throat. Negative for congestion, sneezing and trouble swallowing.   Eyes: Negative for photophobia and visual disturbance.  Respiratory: Negative for cough, chest tightness, shortness of breath, wheezing and stridor.   Cardiovascular: Negative for chest pain, palpitations and leg swelling.  Gastrointestinal: Negative for nausea, vomiting, abdominal pain, diarrhea, constipation, blood in stool, abdominal distention and anal bleeding.  Genitourinary: Negative for dysuria, hematuria, flank pain and difficulty urinating.  Musculoskeletal: Negative for myalgias, back pain, joint swelling, arthralgias and gait problem.  Skin: Positive for color change. Negative for pallor, rash and wound.  Neurological: Positive for headaches. Negative for dizziness, tremors, weakness and light-headedness.  Hematological: Negative for adenopathy. Does not bruise/bleed easily.  Psychiatric/Behavioral: Negative for behavioral problems, confusion, sleep disturbance, dysphoric mood, decreased concentration and agitation.       Objective:   Physical Exam  Constitutional: She is oriented to person, place, and time. She appears well-developed and well-nourished. No distress.  HENT:  Head: Normocephalic and atraumatic.    Mouth/Throat: Oropharynx is clear and moist. No oropharyngeal exudate.  Eyes: Conjunctivae and EOM are normal. Pupils are equal, round, and reactive to light. No scleral icterus.  Neck: Normal range of motion. Neck supple. No JVD present.  Cardiovascular: Normal rate, regular rhythm and normal heart sounds.  Exam reveals no gallop and no friction rub.   No murmur heard. Pulmonary/Chest: Effort normal and breath sounds normal. No respiratory distress. She has no wheezes. She has no rales. She exhibits no tenderness.  Abdominal: She exhibits no  distension and no mass. There is no tenderness. There is no rebound and no guarding.  Musculoskeletal: She exhibits no edema or tenderness.  Lymphadenopathy:    She has no cervical adenopathy.  Neurological: She is alert and oriented to person, place, and time. She has normal reflexes. She exhibits normal muscle tone. Coordination normal.  Skin: Skin is warm and dry. She is not diaphoretic. No erythema. No pallor.  Psychiatric: She has a normal mood and affect. Her behavior is normal. Judgment and thought content normal.          Assessment & Plan:   HIV DISEASE   Checking labs today and in 6 months and continue  Tivicay, PREZCOBIX and Edurant with 400 calorie meal   We  spent greater than 40 minutes with the patient including greater than 50% of time in face to face counsel of the patient (re her prior resistance genotype and new medications that we desire to put her on for better more complete regimen)  and in coordination of their care.  URI: ok to use flonase with isolated episodes but not regularly given the COBI  HYPERTENSION : Better controlled Followed by Dr Dewaine Oats  Morbid obesity: to try to cut calories and to followup with Dr. Dewaine Oats  HYPOTHYROIDISM, POST-RADIATION  Followed by Dr. Loanne Drilling,.   Hyperlipidemia: LDL at 99. Would STILL consider adding statin

## 2014-12-02 LAB — T-HELPER CELL (CD4) - (RCID CLINIC ONLY)
CD4 % Helper T Cell: 41 % (ref 33–55)
CD4 T Cell Abs: 790 /uL (ref 400–2700)

## 2014-12-02 LAB — HIV-1 RNA QUANT-NO REFLEX-BLD: HIV 1 RNA Quant: <20 copies/mL (ref <20)

## 2014-12-09 ENCOUNTER — Encounter: Payer: Self-pay | Admitting: Nurse Practitioner

## 2014-12-09 ENCOUNTER — Ambulatory Visit (INDEPENDENT_AMBULATORY_CARE_PROVIDER_SITE_OTHER): Payer: Commercial Managed Care - HMO | Admitting: Nurse Practitioner

## 2014-12-09 VITALS — BP 122/80 | HR 86 | Temp 98.1°F | Resp 20 | Ht 66.0 in | Wt 268.2 lb

## 2014-12-09 DIAGNOSIS — L659 Nonscarring hair loss, unspecified: Secondary | ICD-10-CM

## 2014-12-09 DIAGNOSIS — Z0000 Encounter for general adult medical examination without abnormal findings: Secondary | ICD-10-CM | POA: Diagnosis not present

## 2014-12-09 MED ORDER — TETANUS-DIPHTH-ACELL PERTUSSIS 5-2.5-18.5 LF-MCG/0.5 IM SUSP: 0.5000 mL | Freq: Once | INTRAMUSCULAR | Status: DC

## 2014-12-09 NOTE — Progress Notes (Addendum)
Patient ID: Kayla Price, female   DOB: 1955/12/05, 59 y.o.   MRN: [id number]    PCP: Lauree Chandler, NP  Allergies  Allergen Reactions  . Lisinopril Swelling    Swelling of tongue and mouth    Chief Complaint  Patient presents with  . Annual Exam     HPI: Patient is a 59 y.o. female seen in the office today for annual exam. Pt with a pmh of breast cancer, HIV, hypothyroidism, hyperlipidemia, HTN, vit d def, PVD, alopecia areata, OA, bell's palsy, hyperglycemia.   Following with seeing Dr Tommy Medal (ID) and Dr Loanne Drilling (endocrinology) routinely.   Screenings: Colon Cancer- Dr Bonnita Nasuti, endoscopy and colonoscopy done, recommended follow up (had done btw 2008-2011)  Breast Cancer- last done 08/19/2014, yearly Cervical Cancer- last PAP 01/26/13, ID every 2 years  Osteoporosis- Dexa Scan-- done 2006-2010 normal   Vaccines Up to date on: influenza, pneumococcal  Need:  Tdap   Smoking status: never smoked Alcohol use: none  Dentist: every 3 months for cleaning, corrective work done yesterday  Ophthalmologist:occasionally   Exercise regimen: stationary bike 30-45 mins a day and walk in pool- 2-3 times a week Diet: low sodium diet, low fat   Advanced Directive information Does patient have an advance directive?: No, Would patient like information on creating an advanced directive?: Yes - Educational materials given Review of Systems:  Review of Systems  Constitutional: Negative for fever, chills, activity change, appetite change, fatigue and unexpected weight change.  HENT: Negative for congestion, ear discharge, ear pain, hearing loss, postnasal drip, rhinorrhea, sinus pressure and sore throat.   Eyes: Negative for photophobia, pain, discharge, redness, itching and visual disturbance.  Respiratory: Negative for cough and shortness of breath.   Cardiovascular: Negative for chest pain, palpitations and leg swelling.  Gastrointestinal: Negative for abdominal pain, diarrhea and  constipation.  Genitourinary: Positive for frequency (chronic frequency, on HTCZ). Negative for dysuria and difficulty urinating.  Musculoskeletal: Positive for arthralgias (occasionally, L knee). Negative for myalgias.       Hx of fall in 2009, fx right hip which will cause her some discomfort if the weather is cold or rainy  Skin: Negative for color change and wound.  Allergic/Immunologic: Negative for environmental allergies.  Neurological: Negative for dizziness and weakness.  Psychiatric/Behavioral: Negative for behavioral problems, confusion and agitation. The patient is not nervous/anxious.     Past Medical History  Diagnosis Date  . HIV DISEASE 03/27/2006  . 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 Unspecified vitamin D deficiency 08/06/2007  . HYPERLIPIDEMIA, MIXED 12/15/2007  . HYPERTENSION 03/27/2006  . PVD 04/07/2007  . Alopecia areata 11/28/2009  . MENORRHAGIA, POSTMENOPAUSAL 02/03/2009  . TRIGGER FINGER 05/06/2008  . HIP FRACTURE, RIGHT 05/06/2008  . BREAST CANCER, HX OF 03/27/2006  . OSTEOARTHROSIS, LOCAL, SCND, UNSPC SITE 04/07/2007  . Fasting hyperglycemia   . Tinea capitis   . HIV (human immunodeficiency virus infection)   . Dry eye syndrome   . Bell's palsy   . Diabetes mellitus, controlled 12/01/2014   Past Surgical History  Procedure Laterality Date  . Tubal ligation    . Breast lumpectomy    . Lymph node dissection  2005  . Port-a-cath removal  2006    insertion 2005  . Hysteroscopy  2006  . Thyroid surgery  2009    Ablation   . Colonoscopy     Social History:   reports that she has never smoked. She has never used smokeless tobacco. She reports that she does  not drink alcohol or use illicit drugs.  Family History  Problem Relation Age of Onset  . Heart attack Brother     Massive MI in 24s  . Stroke Brother     CAD  . Kidney disease Mother   . Stroke Mother   . Diabetes Mother   . Liver disease Sister   . COPD Sister     had I-131 rx of  hyperthyroidism  . Diabetes Sister   . Stroke Sister   . Cancer Paternal Uncle   . Cancer Cousin   . Heart failure Father   . Heart disease Father   . Arthritis Father   . Sarcoidosis Sister     Medications: Patient's Medications  New Prescriptions   No medications on file  Previous Medications   B COMPLEX-C (SUPER B COMPLEX PO)    Take 1 tablet by mouth daily.   BENICAR HCT 40-25 MG PER TABLET    TAKE 1 TABLET BY MOUTH EVERY DAY   BIOTIN 5000 MCG CAPS    Take 5,000 mcg by mouth daily.   CHLORPHEN-PSEUDOEPHED-APAP (CORICIDIN D PO)    Take by mouth. As needed   CHOLECALCIFEROL (D3 ADULT PO)    Take 2,000 Units by mouth daily.   CHROMIUM PICOLINATE 800 MCG TABS    Take 800 mcg by mouth daily.   DARUNAVIR-COBICISTAT (PREZCOBIX) 800-150 MG PER TABLET    Take 1 tablet by mouth daily. Swallow whole. Do NOT crush, break or chew tablets. Take with food.   DOLUTEGRAVIR (TIVICAY) 50 MG TABLET    Take 1 tablet (50 mg total) by mouth daily.   EVENING PRIMROSE OIL 1000 MG CAPS    Take 1,000 capsules by mouth daily.   LEVOTHYROXINE (SYNTHROID, LEVOTHROID) 150 MCG TABLET    TAKE 1 TABLET (150 MCG TOTAL) BY MOUTH DAILY.   LORATADINE (CLARITIN) 10 MG TABLET    Take 10 mg by mouth daily as needed for itching.   OMEGA-3 FATTY ACIDS (FISH OIL) 1000 MG CAPS    Take 1,000 mg by mouth daily.   POTASSIUM GLUCONATE PO    Take 1 tablet by mouth daily. 595 mg   PROBIOTIC PRODUCT (PROBIOTIC COMPLEX ACIDOPHILUS) CAPS    Take 1 capsule by mouth daily.   RILPIVIRINE (EDURANT) 25 MG TABS TABLET    Take 1 tablet (25 mg total) by mouth daily with breakfast.   TRAMADOL (ULTRAM) 50 MG TABLET    Take 1 tablet (50 mg total) by mouth every 8 (eight) hours as needed.   VITAMIN E PO    Take 1 tablet by mouth daily. 400 units  Modified Medications   No medications on file  Discontinued Medications   No medications on file     Physical Exam:  Filed Vitals:   12/09/14 1103  BP: 122/80  Pulse: 86  Temp: 98.1 F  (36.7 C)  TempSrc: Oral  Resp: 20  Height: '5\' 6"'$  (1.676 m)  Weight: 268 lb 3.2 oz (121.655 kg)  SpO2: 97%    Physical Exam  Constitutional: She is oriented to person, place, and time. She appears well-developed and well-nourished. No distress.  HENT:  Head: Normocephalic and atraumatic.  Right Ear: External ear normal.  Left Ear: External ear normal.  Nose: Nose normal.  Mouth/Throat: Oropharynx is clear and moist. No oropharyngeal exudate.  Eyes: Pupils are equal, round, and reactive to light.  Neck: Normal range of motion. Neck supple.  Cardiovascular: Normal rate, regular rhythm and normal heart sounds.  Pulmonary/Chest: Effort normal and breath sounds normal.  Abdominal: Soft. Bowel sounds are normal. She exhibits no distension. There is no tenderness.  Musculoskeletal: Normal range of motion. She exhibits no edema or tenderness.  Neurological: She is alert and oriented to person, place, and time.  Skin: Skin is warm and dry. She is not diaphoretic. No erythema.  Alopecia noted to scalp with multiple raised areas  Psychiatric: She has a normal mood and affect.    Labs reviewed: Basic Metabolic Panel:  Recent Labs  03/25/14 0932  04/23/14 0926 10/15/14 1007 12/01/14 1012  NA  --   < > 138 146* 142  K  --   < > 3.8 4.7 4.5  CL  --   < > 102 102 102  CO2  --   < > '20 25 27  '$ GLUCOSE  --   < > 93 122* 100*  BUN  --   < > 19 18 24*  CREATININE  --   < > 1.27* 1.21* 1.30*  CALCIUM  --   < > 9.8 10.5* 10.4  TSH 2.42  --   --   --   --   < > = values in this interval not displayed. Liver Function Tests:  Recent Labs  04/08/14 0933 10/15/14 1007 12/01/14 1012  AST 35 48* 19  ALT 36* 51* 22  ALKPHOS 91 127* 97  BILITOT 0.4 <0.2 0.4  PROT 7.6 7.6 7.7  ALBUMIN 4.0  --  3.9   No results for input(s): LIPASE, AMYLASE in the last 8760 hours. No results for input(s): AMMONIA in the last 8760 hours. CBC:  Recent Labs  04/08/14 0933 10/05/14 1019 12/01/14 1012    WBC 4.3 4.4 4.9  NEUTROABS 1.8 2.0 2.4  HGB 15.3* 15.6* 14.7  HCT 41.7 44.4 43.2  MCV 92.5 95.9 97.1  PLT 215 211 205   Lipid Panel:  Recent Labs  04/08/14 0933 10/15/14 1007  CHOL 203* 191  HDL 51 65  LDLCALC 123* 99  TRIG 143 134  CHOLHDL 4.0 2.9   TSH:  Recent Labs  03/25/14 0932  TSH 2.42   A1C: Lab Results  Component Value Date   HGBA1C 5.7* 10/15/2014     Assessment/Plan  1. Preventative health care -to cont exercise and diet modification -conts to follow up with ID and endocrine  PREVENTIVE COUNSELING:  The patient was counseled regarding the appropriate use of alcohol, regular self-examination of the breasts on a monthly basis, prevention of dental and periodontal disease, diet, regular sustained exercise for at least 30 minutes 5 times per week, routine screening interval for mammogram as recommended by the [place] and ACOG, importance of regular PAP smears, tobacco use,  and recommended schedule for GI hemoccult testing, colonoscopy, cholesterol, thyroid and diabetes screening. - to get records from last colonoscopy -TDAP Rx given   2. Alopecia -with dry itchy patches, pt reports she had tired multiple creams, steroids, antifungals which has been not effective.   - Ambulatory referral to Dermatology for further evaluation and treatment   30 mins Time TOTAL:  time greater than 50% of total time spent doing pt counseled and coordination of care regarding preventative health care

## 2014-12-09 NOTE — Patient Instructions (Signed)
Let us know exact date of last colonoscopy Will place referral for dermatology   Health Maintenance Adopting a healthy lifestyle and getting preventive care can go a long way to promote health and wellness. Talk with your health care provider about what schedule of regular examinations is right for you. This is a good chance for you to check in with your provider about disease prevention and staying healthy. In between checkups, there are plenty of things you can do on your own. Experts have done a lot of research about which lifestyle changes and preventive measures are most likely to keep you healthy. Ask your health care provider for more information. WEIGHT AND DIET  Eat a healthy diet  Be sure to include plenty of vegetables, fruits, low-fat dairy products, and lean protein.  Do not eat a lot of foods high in solid fats, added sugars, or salt.  Get regular exercise. This is one of the most important things you can do for your health.  Most adults should exercise for at least 150 minutes each week. The exercise should increase your heart rate and make you sweat (moderate-intensity exercise).  Most adults should also do strengthening exercises at least twice a week. This is in addition to the moderate-intensity exercise.  Maintain a healthy weight  Body mass index (BMI) is a measurement that can be used to identify possible weight problems. It estimates body fat based on height and weight. Your health care provider can help determine your BMI and help you achieve or maintain a healthy weight.  For females 89 years of age and older:   A BMI below 18.5 is considered underweight.  A BMI of 18.5 to 24.9 is normal.  A BMI of 25 to 29.9 is considered overweight.  A BMI of 30 and above is considered obese.  Watch levels of cholesterol and blood lipids  You should start having your blood tested for lipids and cholesterol at 59 years of age, then have this test every 5 years.  You may  need to have your cholesterol levels checked more often if:  Your lipid or cholesterol levels are high.  You are older than 59 years of age.  You are at high risk for heart disease.  CANCER SCREENING   Lung Cancer  Lung cancer screening is recommended for adults 7-28 years old who are at high risk for lung cancer because of a history of smoking.  A yearly low-dose CT scan of the lungs is recommended for people who:  Currently smoke.  Have quit within the past 15 years.  Have at least a 30-pack-year history of smoking. A pack year is smoking an average of one pack of cigarettes a day for 1 year.  Yearly screening should continue until it has been 15 years since you quit.  Yearly screening should stop if you develop a health problem that would prevent you from having lung cancer treatment.  Breast Cancer  Practice breast self-awareness. This means understanding how your breasts normally appear and feel.  It also means doing regular breast self-exams. Let your health care provider know about any changes, no matter how small.  If you are in your 20s or 30s, you should have a clinical breast exam (CBE) by a health care provider every 1-3 years as part of a regular health exam.  If you are 23 or older, have a CBE every year. Also consider having a breast X-ray (mammogram) every year.  If you have a family history of  breast cancer, talk to your health care provider about genetic screening.  If you are at high risk for breast cancer, talk to your health care provider about having an MRI and a mammogram every year.  Breast cancer gene (BRCA) assessment is recommended for women who have family members with BRCA-related cancers. BRCA-related cancers include:  Breast.  Ovarian.  Tubal.  Peritoneal cancers.  Results of the assessment will determine the need for genetic counseling and BRCA1 and BRCA2 testing. Cervical Cancer Routine pelvic examinations to screen for cervical  cancer are no longer recommended for nonpregnant women who are considered low risk for cancer of the pelvic organs (ovaries, uterus, and vagina) and who do not have symptoms. A pelvic examination may be necessary if you have symptoms including those associated with pelvic infections. Ask your health care provider if a screening pelvic exam is right for you.   The Pap test is the screening test for cervical cancer for women who are considered at risk.  If you had a hysterectomy for a problem that was not cancer or a condition that could lead to cancer, then you no longer need Pap tests.  If you are older than 65 years, and you have had normal Pap tests for the past 10 years, you no longer need to have Pap tests.  If you have had past treatment for cervical cancer or a condition that could lead to cancer, you need Pap tests and screening for cancer for at least 20 years after your treatment.  If you no longer get a Pap test, assess your risk factors if they change (such as having a new sexual partner). This can affect whether you should start being screened again.  Some women have medical problems that increase their chance of getting cervical cancer. If this is the case for you, your health care provider may recommend more frequent screening and Pap tests.  The human papillomavirus (HPV) test is another test that may be used for cervical cancer screening. The HPV test looks for the virus that can cause cell changes in the cervix. The cells collected during the Pap test can be tested for HPV.  The HPV test can be used to screen women 48 years of age and older. Getting tested for HPV can extend the interval between normal Pap tests from three to five years.  An HPV test also should be used to screen women of any age who have unclear Pap test results.  After 59 years of age, women should have HPV testing as often as Pap tests.  Colorectal Cancer  This type of cancer can be detected and often  prevented.  Routine colorectal cancer screening usually begins at 59 years of age and continues through 59 years of age.  Your health care provider may recommend screening at an earlier age if you have risk factors for colon cancer.  Your health care provider may also recommend using home test kits to check for hidden blood in the stool.  A small camera at the end of a tube can be used to examine your colon directly (sigmoidoscopy or colonoscopy). This is done to check for the earliest forms of colorectal cancer.  Routine screening usually begins at age 50.  Direct examination of the colon should be repeated every 5-10 years through 59 years of age. However, you may need to be screened more often if early forms of precancerous polyps or small growths are found. Skin Cancer  Check your skin from head to  toe regularly.  Tell your health care provider about any new moles or changes in moles, especially if there is a change in a mole's shape or color.  Also tell your health care provider if you have a mole that is larger than the size of a pencil eraser.  Always use sunscreen. Apply sunscreen liberally and repeatedly throughout the day.  Protect yourself by wearing long sleeves, pants, a wide-brimmed hat, and sunglasses whenever you are outside. HEART DISEASE, DIABETES, AND HIGH BLOOD PRESSURE   Have your blood pressure checked at least every 1-2 years. High blood pressure causes heart disease and increases the risk of stroke.  If you are between 39 years and 24 years old, ask your health care provider if you should take aspirin to prevent strokes.  Have regular diabetes screenings. This involves taking a blood sample to check your fasting blood sugar level.  If you are at a normal weight and have a low risk for diabetes, have this test once every three years after 59 years of age.  If you are overweight and have a high risk for diabetes, consider being tested at a younger age or more  often. PREVENTING INFECTION  Hepatitis B  If you have a higher risk for hepatitis B, you should be screened for this virus. You are considered at high risk for hepatitis B if:  You were born in a country where hepatitis B is common. Ask your health care provider which countries are considered high risk.  Your parents were born in a high-risk country, and you have not been immunized against hepatitis B (hepatitis B vaccine).  You have HIV or AIDS.  You use needles to inject street drugs.  You live with someone who has hepatitis B.  You have had sex with someone who has hepatitis B.  You get hemodialysis treatment.  You take certain medicines for conditions, including cancer, organ transplantation, and autoimmune conditions. Hepatitis C  Blood testing is recommended for:  Everyone born from [birth year] through 1965.  Anyone with known risk factors for hepatitis C. Sexually transmitted infections (STIs)  You should be screened for sexually transmitted infections (STIs) including gonorrhea and chlamydia if:  You are sexually active and are younger than 59 years of age.  You are older than 59 years of age and your health care provider tells you that you are at risk for this type of infection.  Your sexual activity has changed since you were last screened and you are at an increased risk for chlamydia or gonorrhea. Ask your health care provider if you are at risk.  If you do not have HIV, but are at risk, it may be recommended that you take a prescription medicine daily to prevent HIV infection. This is called pre-exposure prophylaxis (PrEP). You are considered at risk if:  You are sexually active and do not regularly use condoms or know the HIV status of your partner(s).  You take drugs by injection.  You are sexually active with a partner who has HIV. Talk with your health care provider about whether you are at high risk of being infected with HIV. If you choose to begin PrEP, you  should first be tested for HIV. You should then be tested every 3 months for as long as you are taking PrEP.  PREGNANCY   If you are premenopausal and you may become pregnant, ask your health care provider about preconception counseling.  If you may become pregnant, take 400 to 800 micrograms (mcg)  of folic acid every day.  If you want to prevent pregnancy, talk to your health care provider about birth control (contraception). OSTEOPOROSIS AND MENOPAUSE   Osteoporosis is a disease in which the bones lose minerals and strength with aging. This can result in serious bone fractures. Your risk for osteoporosis can be identified using a bone density scan.  If you are 68 years of age or older, or if you are at risk for osteoporosis and fractures, ask your health care provider if you should be screened.  Ask your health care provider whether you should take a calcium or vitamin D supplement to lower your risk for osteoporosis.  Menopause may have certain physical symptoms and risks.  Hormone replacement therapy may reduce some of these symptoms and risks. Talk to your health care provider about whether hormone replacement therapy is right for you.  HOME CARE INSTRUCTIONS   Schedule regular health, dental, and eye exams.  Stay current with your immunizations.   Do not use any tobacco products including cigarettes, chewing tobacco, or electronic cigarettes.  If you are pregnant, do not drink alcohol.  If you are breastfeeding, limit how much and how often you drink alcohol.  Limit alcohol intake to no more than 1 drink per day for nonpregnant women. One drink equals 12 ounces of beer, 5 ounces of wine, or 1 ounces of hard liquor.  Do not use street drugs.  Do not share needles.  Ask your health care provider for help if you need support or information about quitting drugs.  Tell your health care provider if you often feel depressed.  Tell your health care provider if you have ever  been abused or do not feel safe at home. Document Released: 12/25/2010 Document Revised: 10/26/2013 Document Reviewed: 05/13/2013 [facility] Patient Information 2015 [place], [place]. This information is not intended to replace advice given to you by your health care provider. Make sure you discuss any questions you have with your health care provider.

## 2014-12-10 ENCOUNTER — Telehealth: Payer: Self-pay | Admitting: *Deleted

## 2014-12-10 NOTE — Telephone Encounter (Signed)
Spoke with patient regarding her eye appointment, I informed her that she already has an appointment scheduled for 06/21/2015 '@2'$ :15 pm. She wrote it down an will keep it for her health maintenance requirement.

## 2014-12-14 ENCOUNTER — Other Ambulatory Visit: Payer: Self-pay | Admitting: Endocrinology

## 2014-12-21 ENCOUNTER — Encounter: Payer: Self-pay | Admitting: Nurse Practitioner

## 2014-12-24 HISTORY — PX: OTHER SURGICAL HISTORY: SHX169

## 2015-01-14 DIAGNOSIS — L280 Lichen simplex chronicus: Secondary | ICD-10-CM | POA: Diagnosis not present

## 2015-01-14 DIAGNOSIS — L739 Follicular disorder, unspecified: Secondary | ICD-10-CM | POA: Diagnosis not present

## 2015-02-07 ENCOUNTER — Other Ambulatory Visit: Payer: Self-pay | Admitting: Endocrinology

## 2015-03-03 NOTE — Addendum Note (Signed)
Addended by: Lauree Chandler on: 03/03/2015 11:38 AM   Modules accepted: Level of Service

## 2015-03-07 DIAGNOSIS — H02421 Myogenic ptosis of right eyelid: Secondary | ICD-10-CM | POA: Diagnosis not present

## 2015-03-07 DIAGNOSIS — G510 Bell's palsy: Secondary | ICD-10-CM | POA: Diagnosis not present

## 2015-03-07 DIAGNOSIS — L309 Dermatitis, unspecified: Secondary | ICD-10-CM | POA: Diagnosis not present

## 2015-03-07 DIAGNOSIS — H04123 Dry eye syndrome of bilateral lacrimal glands: Secondary | ICD-10-CM | POA: Diagnosis not present

## 2015-03-07 LAB — HM DIABETES EYE EXAM

## 2015-03-12 ENCOUNTER — Other Ambulatory Visit: Payer: Self-pay | Admitting: Infectious Disease

## 2015-03-14 ENCOUNTER — Other Ambulatory Visit: Payer: Self-pay | Admitting: *Deleted

## 2015-03-14 DIAGNOSIS — I10 Essential (primary) hypertension: Secondary | ICD-10-CM

## 2015-03-14 MED ORDER — OLMESARTAN MEDOXOMIL-HCTZ 40-25 MG PO TABS: 1.0000 | ORAL_TABLET | Freq: Every day | ORAL | Status: DC

## 2015-04-11 ENCOUNTER — Other Ambulatory Visit: Payer: Self-pay | Admitting: Endocrinology

## 2015-05-11 ENCOUNTER — Other Ambulatory Visit: Payer: Self-pay | Admitting: Endocrinology

## 2015-05-11 NOTE — Telephone Encounter (Signed)
Please refill x 1 Ov is due  

## 2015-05-11 NOTE — Telephone Encounter (Signed)
.   Please advise if ok to refill last office visit was 10.5.2015.

## 2015-05-24 ENCOUNTER — Other Ambulatory Visit: Payer: Commercial Managed Care - HMO

## 2015-05-24 ENCOUNTER — Other Ambulatory Visit (HOSPITAL_COMMUNITY)
Admission: RE | Admit: 2015-05-24 | Discharge: 2015-05-24 | Disposition: A | Discharge status: Home / Self Care | Payer: Commercial Managed Care - HMO | Source: Ambulatory Visit | Attending: Infectious Disease | Admitting: Infectious Disease

## 2015-05-24 DIAGNOSIS — B20 Human immunodeficiency virus [HIV] disease: Secondary | ICD-10-CM | POA: Diagnosis not present

## 2015-05-24 DIAGNOSIS — Z113 Encounter for screening for infections with a predominantly sexual mode of transmission: Secondary | ICD-10-CM | POA: Diagnosis not present

## 2015-05-24 LAB — COMPLETE METABOLIC PANEL WITH GFR
ALBUMIN: 3.8 g/dL (ref 3.6–5.1)
ALK PHOS: 73 U/L (ref 33–130)
ALT: 14 U/L (ref 6–29)
AST: 15 U/L (ref 10–35)
BILIRUBIN TOTAL: 0.5 mg/dL (ref 0.2–1.2)
BUN: 16 mg/dL (ref 7–25)
CO2: 29 mmol/L (ref 20–31)
CREATININE: 1.44 mg/dL — AB (ref 0.50–1.05)
Calcium: 10.2 mg/dL (ref 8.6–10.4)
Chloride: 101 mmol/L (ref 98–110)
GFR, EST NON AFRICAN AMERICAN: 40 mL/min — AB (ref >=60)
GFR, Est African American: 46 mL/min — ABNORMAL LOW (ref >=60)
GLUCOSE: 94 mg/dL (ref 65–99)
Potassium: 3.9 mmol/L (ref 3.5–5.3)
SODIUM: 141 mmol/L (ref 135–146)
TOTAL PROTEIN: 7.5 g/dL (ref 6.1–8.1)

## 2015-05-24 LAB — CBC WITH DIFFERENTIAL/PLATELET
BASOS ABS: 0.1 10*3/uL (ref 0.0–0.1)
BASOS PCT: 1 % (ref 0–1)
EOS ABS: 0.1 10*3/uL (ref 0.0–0.7)
Eosinophils Relative: 2 % (ref 0–5)
HCT: 42.5 % (ref 36.0–46.0)
HEMOGLOBIN: 15 g/dL (ref 12.0–15.0)
LYMPHS ABS: 2.2 10*3/uL (ref 0.7–4.0)
Lymphocytes Relative: 41 % (ref 12–46)
MCH: 32.5 pg (ref 26.0–34.0)
MCHC: 35.3 g/dL (ref 30.0–36.0)
MCV: 92 fL (ref 78.0–100.0)
MONOS PCT: 9 % (ref 3–12)
MPV: 10 fL (ref 8.6–12.4)
Monocytes Absolute: 0.5 10*3/uL (ref 0.1–1.0)
NEUTROS ABS: 2.5 10*3/uL (ref 1.7–7.7)
NEUTROS PCT: 47 % (ref 43–77)
PLATELETS: 209 10*3/uL (ref 150–400)
RBC: 4.62 MIL/uL (ref 3.87–5.11)
RDW: 14.3 % (ref 11.5–15.5)
WBC: 5.3 10*3/uL (ref 4.0–10.5)

## 2015-05-24 LAB — LIPID PANEL
Cholesterol: 182 mg/dL (ref 125–200)
HDL: 55 mg/dL (ref >=46)
LDL CALC: 107 mg/dL (ref <130)
Total CHOL/HDL Ratio: 3.3 Ratio (ref <=5.0)
Triglycerides: 102 mg/dL (ref <150)
VLDL: 20 mg/dL (ref <30)

## 2015-05-25 LAB — T-HELPER CELL (CD4) - (RCID CLINIC ONLY)
CD4 % Helper T Cell: 33 % (ref 33–55)
CD4 T Cell Abs: 780 /uL (ref 400–2700)

## 2015-05-25 LAB — MICROALBUMIN / CREATININE URINE RATIO
Creatinine, Urine: 224 mg/dL (ref 20–320)
MICROALB UR: 1.5 mg/dL
MICROALB/CREAT RATIO: 7 ug/mg{creat} (ref <30)

## 2015-05-25 LAB — HIV-1 RNA QUANT-NO REFLEX-BLD
HIV 1 RNA Quant: 38 copies/mL — ABNORMAL HIGH (ref <20)
HIV-1 RNA Quant, Log: 1.58 Log copies/mL — ABNORMAL HIGH (ref <1.30)

## 2015-05-25 LAB — RPR

## 2015-05-26 LAB — URINE CYTOLOGY ANCILLARY ONLY
Chlamydia: NEGATIVE
NEISSERIA GONORRHEA: NEGATIVE

## 2015-06-06 ENCOUNTER — Telehealth: Payer: Self-pay | Admitting: Nurse Practitioner

## 2015-06-06 DIAGNOSIS — M1990 Unspecified osteoarthritis, unspecified site: Secondary | ICD-10-CM

## 2015-06-06 NOTE — Telephone Encounter (Signed)
Patient called requesting a referral for Orthopedics, She stated she called and scheduled an appointment with Dr. Alfonso Ramus at [facility] for 9:00 tomorrow 06/07/15 but needs a referral for insurance.   She stated she is having inflammation in her knees primarily her left knee though. Said she has gotten knee injections from Dr. Alfonso Ramus before  Please advise.

## 2015-06-06 NOTE — Telephone Encounter (Signed)
Referral placed.

## 2015-06-07 DIAGNOSIS — M170 Bilateral primary osteoarthritis of knee: Secondary | ICD-10-CM | POA: Diagnosis not present

## 2015-06-08 ENCOUNTER — Encounter: Payer: Self-pay | Admitting: Infectious Disease

## 2015-06-08 ENCOUNTER — Ambulatory Visit (INDEPENDENT_AMBULATORY_CARE_PROVIDER_SITE_OTHER): Payer: Commercial Managed Care - HMO | Admitting: Infectious Disease

## 2015-06-08 VITALS — BP 139/94 | HR 70 | Temp 97.9°F | Wt 276.0 lb

## 2015-06-08 DIAGNOSIS — M1993 Secondary osteoarthritis, unspecified site: Secondary | ICD-10-CM

## 2015-06-08 DIAGNOSIS — B20 Human immunodeficiency virus [HIV] disease: Secondary | ICD-10-CM

## 2015-06-08 DIAGNOSIS — I10 Essential (primary) hypertension: Secondary | ICD-10-CM | POA: Diagnosis not present

## 2015-06-08 DIAGNOSIS — M1712 Unilateral primary osteoarthritis, left knee: Secondary | ICD-10-CM

## 2015-06-08 DIAGNOSIS — N183 Chronic kidney disease, stage 3 unspecified: Secondary | ICD-10-CM

## 2015-06-08 DIAGNOSIS — E890 Postprocedural hypothyroidism: Secondary | ICD-10-CM

## 2015-06-08 DIAGNOSIS — M179 Osteoarthritis of knee, unspecified: Secondary | ICD-10-CM

## 2015-06-08 HISTORY — DX: Unilateral primary osteoarthritis, left knee: M17.12

## 2015-06-08 HISTORY — DX: Chronic kidney disease, stage 3 unspecified: N18.30

## 2015-06-08 NOTE — Progress Notes (Signed)
Chief complaint: left knee pain Subjective:    Patient ID: Kayla Price, female    DOB: [birth year], 59 y.o.   MRN: [id number]  HPI   Kayla Price is a 59 y.o. female who is doing superbly well on her  antiviral regimen, of Tivicay, Prezcobix and Edurant with undetectable viral load and health cd4 count.   At prior [facility] and I reviewed her  HIV and pulled in ALL of the R mutations noted in Dr Ruffin Frederick notes and we had neglected to mention a 41L which when added to all of her other nutation is essentially takes out all the and NRTI's and led to change from prior regimen of Isentress, Atripla to current one.  HIVdb: Genotypic Resistance Interpretation Algorithm  Date: 27-Oct-2014 17:15:11 UTC   Drug Resistance Interpretation: PR PI Major Resistance Mutations: None PI Minor Resistance Mutations: None Other Mutations: V77I Protease Inhibitors atazanavir/r (ATV/r) Susceptible darunavir/r (DRV/r) Susceptible fosamprenavir/r (FPV/r) Susceptible indinavir/r (IDV/r) Susceptible lopinavir/r (LPV/r) Susceptible nelfinavir (NFV) Susceptible saquinavir/r (SQV/r) Susceptible tipranavir/r (TPV/r) Susceptible PR Comments  Drug Resistance Interpretation: RT NRTI Resistance Mutations: M41L, D67N, M184V, L210W, T215Y NNRTI Resistance Mutations: None Other Mutations: E44D, V118I Nucleoside RTI lamivudine (3TC) High-level resistance abacavir (ABC) High-level resistance zidovudine (AZT) High-level resistance stavudine (D4T) High-level resistance didanosine (DDI) High-level resistance emtricitabine (FTC) High-level resistance tenofovir (TDF) High-level resistance Non-Nucleoside RTI efavirenz (EFV) Susceptible etravirine (ETR) Susceptible nevirapine (NVP) Susceptible rilpivirine (RPV) Susceptible    Lab Results  Component Value Date   HIV1RNAQUANT 38* 05/24/2015   Lab Results  Component Value Date   CD4TABS 780 05/24/2015   CD4TABS 790 12/01/2014   CD4TABS 670 10/05/2014     She is having significant left knee pain and is to undergo L TKA this summer.  Also trying to lose weight.  Past Medical History  Diagnosis Date  . HIV DISEASE 03/27/2006  . 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 Unspecified vitamin D deficiency 08/06/2007  . HYPERLIPIDEMIA, MIXED 12/15/2007  . HYPERTENSION 03/27/2006  . PVD 04/07/2007  . Alopecia areata 11/28/2009  . MENORRHAGIA, POSTMENOPAUSAL 02/03/2009  . TRIGGER FINGER 05/06/2008  . HIP FRACTURE, RIGHT 05/06/2008  . BREAST CANCER, HX OF 03/27/2006  . OSTEOARTHROSIS, LOCAL, SCND, UNSPC SITE 04/07/2007  . Fasting hyperglycemia   . Tinea capitis   . HIV (human immunodeficiency virus infection) ([place])   . Dry eye syndrome   . Bell's palsy   . Diabetes mellitus, controlled ([place]) 12/01/2014  . Osteoarthritis of left knee 06/08/2015  . CKD (chronic kidney disease) stage 3, GFR 30-59 ml/min 06/08/2015    Past Surgical History  Procedure Laterality Date  . Tubal ligation    . Breast lumpectomy    . Lymph node dissection  2005  . Port-a-cath removal  2006    insertion 2005  . Hysteroscopy  2006  . Thyroid surgery  2009    Ablation   . Colonoscopy      Family History  Problem Relation Age of Onset  . Heart attack Brother     Massive MI in 78s  . Stroke Brother     CAD  . Kidney disease Mother   . Stroke Mother   . Diabetes Mother   . Liver disease Sister   . COPD Sister     had I-131 rx of hyperthyroidism  . Diabetes Sister   . Stroke Sister   . Cancer Paternal Uncle   . Cancer Cousin   . Heart failure Father   . Heart disease Father   .  Arthritis Father   . Sarcoidosis Sister       Social History   Social History  . Marital Status: Widowed    Spouse Name: N/A  . Number of Children: N/A  . Years of Education: N/A   Occupational History  . Tour manager    Social History Main Topics  . Smoking status: Never Smoker   . Smokeless tobacco: Never Used  . Alcohol Use: No  . Drug Use: No  . Sexual  Activity: Not Currently     Comment: declined condoms   Other Topics Concern  . Not on file   Social History Narrative   Single/widow   Diet: good   Do you drink/eat things with caffeine? Tea occasionally   Marital status: Widowed  What year were you married? 1993   Do you live in a house, apartment,assisted living, condo,trailer,ect.)? House   Is it one or more stories? Two   How many persons live in your home? 4   Do you have any pets in you home? No   Current or past profession: Tour manager   Do you exercise? Yes  Type&how often: Stationary Bike, Water Exercise 3-4 x week   Do you have a living will? Yes   Do you have a DNR form? No  If not do you what one?   Do you have signed POA/HPOA forms? No   If so, please bring to your appointment.                   Allergies  Allergen Reactions  . Lisinopril Swelling    Swelling of tongue and mouth     Current outpatient prescriptions:  .  Cholecalciferol (D3 ADULT PO), Take 2,000 Units by mouth daily., Disp: , Rfl:  .  darunavir-cobicistat (PREZCOBIX) 800-150 MG per tablet, Take 1 tablet by mouth daily. Swallow whole. Do NOT crush, break or chew tablets. Take with food., Disp: 30 tablet, Rfl: 11 .  dolutegravir (TIVICAY) 50 MG tablet, Take 1 tablet (50 mg total) by mouth daily., Disp: 30 tablet, Rfl: 11 .  levothyroxine (SYNTHROID, LEVOTHROID) 150 MCG tablet, TAKE 1 TABLET BY MOUTH EVERY DAY BEFORE BREAKFAST **PT NEEDS APPT WITH DR Loanne Drilling FOR MORE REFILLS*, Disp: 30 tablet, Rfl: 0 .  olmesartan-hydrochlorothiazide (BENICAR HCT) 40-25 MG per tablet, Take 1 tablet by mouth daily., Disp: 30 tablet, Rfl: 5 .  Omega-3 Fatty Acids (FISH OIL) 1000 MG CAPS, Take 1,000 mg by mouth daily., Disp: , Rfl:  .  POTASSIUM GLUCONATE PO, Take 1 tablet by mouth daily. 595 mg, Disp: , Rfl:  .  Probiotic Product (PROBIOTIC COMPLEX ACIDOPHILUS) CAPS, Take 1 capsule by mouth daily., Disp: , Rfl:  .  rilpivirine (EDURANT) 25 MG TABS tablet, Take 1  tablet (25 mg total) by mouth daily with breakfast., Disp: 30 tablet, Rfl: 11 .  traMADol (ULTRAM) 50 MG tablet, Take 1 tablet (50 mg total) by mouth every 8 (eight) hours as needed., Disp: 90 tablet, Rfl: 3 .  VITAMIN E PO, Take 1 tablet by mouth daily. 400 units, Disp: , Rfl:  .  B Complex-C (SUPER B COMPLEX PO), Take 1 tablet by mouth daily. Reported on 06/08/2015, Disp: , Rfl:  .  Biotin 5000 MCG CAPS, Take 5,000 mcg by mouth daily. Reported on 06/08/2015, Disp: , Rfl:  .  Chlorphen-Pseudoephed-APAP (CORICIDIN D PO), Take by mouth. Reported on 06/08/2015, Disp: , Rfl:  .  Chromium Picolinate 800 MCG TABS, Take 800 mcg by mouth daily.  Reported on 06/08/2015, Disp: , Rfl:  .  Evening Primrose Oil 1000 MG CAPS, Take 1,000 capsules by mouth daily. Reported on 06/08/2015, Disp: , Rfl:  .  loratadine (CLARITIN) 10 MG tablet, Take 10 mg by mouth daily as needed for itching. Reported on 06/08/2015, Disp: , Rfl:  .  Tdap (BOOSTRIX) 5-2.5-18.5 LF-MCG/0.5 injection, Inject 0.5 mLs into the muscle once. (Patient not taking: Reported on 06/08/2015), Disp: 0.5 mL, Rfl: 0   Review of Systems  Constitutional: Negative for fever, chills, diaphoresis, activity change, appetite change, fatigue and unexpected weight change.  HENT: Negative for congestion, postnasal drip, rhinorrhea, sinus pressure, sneezing, sore throat and trouble swallowing.   Eyes: Negative for photophobia and visual disturbance.  Respiratory: Negative for cough, chest tightness, shortness of breath, wheezing and stridor.   Cardiovascular: Negative for chest pain, palpitations and leg swelling.  Gastrointestinal: Negative for nausea, vomiting, abdominal pain, diarrhea, constipation, blood in stool, abdominal distention and anal bleeding.  Genitourinary: Negative for dysuria, hematuria, flank pain and difficulty urinating.  Musculoskeletal: Positive for arthralgias. Negative for myalgias, back pain, joint swelling and gait problem.  Skin:  Positive for color change. Negative for pallor, rash and wound.  Neurological: Negative for dizziness, tremors, weakness and light-headedness.  Hematological: Negative for adenopathy. Does not bruise/bleed easily.  Psychiatric/Behavioral: Negative for behavioral problems, confusion, sleep disturbance, dysphoric mood, decreased concentration and agitation.       Objective:   Physical Exam  Constitutional: She is oriented to person, place, and time. She appears well-developed and well-nourished. No distress.  HENT:  Head: Normocephalic and atraumatic.    Mouth/Throat: Oropharynx is clear and moist. No oropharyngeal exudate.  Eyes: Conjunctivae and EOM are normal. Pupils are equal, round, and reactive to light. No scleral icterus.  Neck: Normal range of motion. Neck supple. No JVD present.  Cardiovascular: Normal rate and regular rhythm.   Pulmonary/Chest: Effort normal. No respiratory distress. She has no wheezes.  Abdominal: Soft. She exhibits no distension.  Musculoskeletal: She exhibits edema.  Lymphadenopathy:    She has no cervical adenopathy.  Neurological: She is alert and oriented to person, place, and time. She exhibits normal muscle tone. Coordination normal.  Skin: Skin is warm and dry. She is not diaphoretic. No erythema. No pallor.  Psychiatric: She has a normal mood and affect. Her behavior is normal. Judgment and thought content normal.          Assessment & Plan:   HIV DISEASE   Continue current regimen and RTC in May    HYPERTENSION :  Filed Vitals:   06/08/15 0928  BP: 139/94  Pulse: 70  Temp: 97.9 F (36.6 C)    Reasonable control  Followed by Dr Dewaine Oats  Morbid obesity: to try to cut calories, carbohydrates  and to followup with Dr. Dewaine Oats  HYPOTHYROIDISM, POST-RADIATION  Followed by Dr. Loanne Drilling,.   Hyperlipidemia: LDL at 99. Would STILL consider adding statin   Osteoarthritis: left knee to be replaced in May  CKD: creatinine up a  little  I spent greater than 40 minutes with the patient including greater than 50% of time in face to face counsel of the patient re her HIV, her Hypothyroidism, HTN, CKD, OA, obesity and in coordination of her care.

## 2015-06-10 ENCOUNTER — Ambulatory Visit (INDEPENDENT_AMBULATORY_CARE_PROVIDER_SITE_OTHER): Payer: Commercial Managed Care - HMO | Admitting: Internal Medicine

## 2015-06-10 ENCOUNTER — Encounter: Payer: Self-pay | Admitting: Internal Medicine

## 2015-06-10 VITALS — BP 120/80 | HR 68 | Temp 97.5°F | Resp 20 | Ht 66.0 in | Wt 270.8 lb

## 2015-06-10 DIAGNOSIS — M170 Bilateral primary osteoarthritis of knee: Secondary | ICD-10-CM

## 2015-06-10 DIAGNOSIS — B20 Human immunodeficiency virus [HIV] disease: Secondary | ICD-10-CM | POA: Diagnosis not present

## 2015-06-10 DIAGNOSIS — N183 Chronic kidney disease, stage 3 unspecified: Secondary | ICD-10-CM

## 2015-06-10 DIAGNOSIS — E890 Postprocedural hypothyroidism: Secondary | ICD-10-CM

## 2015-06-10 DIAGNOSIS — I10 Essential (primary) hypertension: Secondary | ICD-10-CM

## 2015-06-10 DIAGNOSIS — R739 Hyperglycemia, unspecified: Secondary | ICD-10-CM | POA: Diagnosis not present

## 2015-06-10 MED ORDER — HYDROCODONE-ACETAMINOPHEN 5-325 MG PO TABS: 1.0000 | ORAL_TABLET | Freq: Four times a day (QID) | ORAL | Status: DC | PRN

## 2015-06-10 NOTE — Patient Instructions (Signed)
Follow up with Ortho as scheduled  Continue current medications as ordered  Follow up with Dr Tommy Medal as scheduled  Take pain pill as needed with food  Follow up in 6 mos with Kayla Price for CPE

## 2015-06-10 NOTE — Progress Notes (Signed)
Patient ID: Kayla Price, female   DOB: [birth year], 59 y.o.   MRN: [id number]    Location:    [facility]   Place of Service:   OFFICE  Chief Complaint  Patient presents with  . Medical Management of Chronic Issues    6 month follow-up for Hypertension,DM  . OTHER    Discuss up coming knee surgery    HPI:  59 yo female seen today for f/u. She c/o pain being uncontrolled in left knee. No relief with tramadol. Tried son's norco and it helped. She saw Ortho Percell Miller, [facility]) earlier this week and was told she needs left TKR. She plans to have  Procedure in summer 2017 due to family constraints. She has started synvisc injections this week. She is trying to exercise using exercise bike and swimming with light pool exercises.  HIV - on triple tx; followed by ID Dr Tommy Medal. Viral load down. CD4 780.  HTN/hyperlipidemia - stable on benicar hct. Cholesterol diet controlled  Thyroid - stable on levothyroxine  PVD - stable   Hyperglycemia - A1c 5.7% several mos ago  CKD - Cr 1.44 about 2 weeks ago. She reports urinary frequency x 1 yr. She takes a diuretic  She had her flu shot 2 days ago  Past Medical History  Diagnosis Date  . HIV DISEASE 03/27/2006  . 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 Unspecified vitamin D deficiency 08/06/2007  . HYPERLIPIDEMIA, MIXED 12/15/2007  . HYPERTENSION 03/27/2006  . PVD 04/07/2007  . Alopecia areata 11/28/2009  . MENORRHAGIA, POSTMENOPAUSAL 02/03/2009  . TRIGGER FINGER 05/06/2008  . HIP FRACTURE, RIGHT 05/06/2008  . BREAST CANCER, HX OF 03/27/2006  . OSTEOARTHROSIS, LOCAL, SCND, UNSPC SITE 04/07/2007  . Fasting hyperglycemia   . Tinea capitis   . HIV (human immunodeficiency virus infection) ([place])   . Dry eye syndrome   . Bell's palsy   . Diabetes mellitus, controlled ([place]) 12/01/2014  . Osteoarthritis of left knee 06/08/2015  . CKD (chronic kidney disease) stage 3, GFR 30-59 ml/min 06/08/2015    Past Surgical History  Procedure Laterality Date    . Tubal ligation    . Breast lumpectomy    . Lymph node dissection  2005  . Port-a-cath removal  2006    insertion 2005  . Hysteroscopy  2006  . Thyroid surgery  2009    Ablation   . Colonoscopy      Patient Care Team: Lauree Chandler, NP as PCP - General (Nurse Practitioner) Truman Hayward, MD as PCP - Infectious Diseases (Infectious Diseases) Clent Jacks, MD as Consulting Physician (Ophthalmology) Renato Shin, MD as Consulting Physician (Endocrinology)  Social History   Social History  . Marital Status: Widowed    Spouse Name: N/A  . Number of Children: N/A  . Years of Education: N/A   Occupational History  . Tour manager    Social History Main Topics  . Smoking status: Never Smoker   . Smokeless tobacco: Never Used  . Alcohol Use: No  . Drug Use: No  . Sexual Activity: Not Currently     Comment: declined condoms   Other Topics Concern  . Not on file   Social History Narrative   Single/widow   Diet: good   Do you drink/eat things with caffeine? Tea occasionally   Marital status: Widowed  What year were you married? 1993   Do you live in a house, apartment,assisted living, condo,trailer,ect.)? House   Is it one or more stories? Two  How many persons live in your home? 4   Do you have any pets in you home? No   Current or past profession: Tour manager   Do you exercise? Yes  Type&how often: Stationary Bike, Water Exercise 3-4 x week   Do you have a living will? Yes   Do you have a DNR form? No  If not do you what one?   Do you have signed POA/HPOA forms? No   If so, please bring to your appointment.                    reports that she has never smoked. She has never used smokeless tobacco. She reports that she does not drink alcohol or use illicit drugs.  Allergies  Allergen Reactions  . Lisinopril Swelling    Swelling of tongue and mouth    Medications: Patient's Medications  New Prescriptions   No medications on file  Previous  Medications   B COMPLEX-C (SUPER B COMPLEX PO)    Take 1 tablet by mouth daily. Reported on 06/08/2015   BIOTIN 5000 MCG CAPS    Take 5,000 mcg by mouth daily. Reported on 06/08/2015   CHLORPHEN-PSEUDOEPHED-APAP (CORICIDIN D PO)    Take by mouth. Reported on 06/08/2015   CHOLECALCIFEROL (D3 ADULT PO)    Take 2,000 Units by mouth daily.   CHROMIUM PICOLINATE 800 MCG TABS    Take 800 mcg by mouth daily. Reported on 06/08/2015   DARUNAVIR-COBICISTAT (PREZCOBIX) 800-150 MG PER TABLET    Take 1 tablet by mouth daily. Swallow whole. Do NOT crush, break or chew tablets. Take with food.   DOLUTEGRAVIR (TIVICAY) 50 MG TABLET    Take 1 tablet (50 mg total) by mouth daily.   EVENING PRIMROSE OIL 1000 MG CAPS    Take 1,000 capsules by mouth daily. Reported on 06/08/2015   LEVOTHYROXINE (SYNTHROID, LEVOTHROID) 150 MCG TABLET    TAKE 1 TABLET BY MOUTH EVERY DAY BEFORE BREAKFAST **PT NEEDS APPT WITH DR Loanne Drilling FOR MORE REFILLS*   LORATADINE (CLARITIN) 10 MG TABLET    Take 10 mg by mouth daily as needed for itching. Reported on 06/08/2015   OLMESARTAN-HYDROCHLOROTHIAZIDE (BENICAR HCT) 40-25 MG PER TABLET    Take 1 tablet by mouth daily.   OMEGA-3 FATTY ACIDS (FISH OIL) 1000 MG CAPS    Take 1,000 mg by mouth daily.   POTASSIUM GLUCONATE PO    Take 1 tablet by mouth daily. 595 mg   PROBIOTIC PRODUCT (PROBIOTIC COMPLEX ACIDOPHILUS) CAPS    Take 1 capsule by mouth daily.   RILPIVIRINE (EDURANT) 25 MG TABS TABLET    Take 1 tablet (25 mg total) by mouth daily with breakfast.   TDAP (BOOSTRIX) 5-2.5-18.5 LF-MCG/0.5 INJECTION    Inject 0.5 mLs into the muscle once.   TRAMADOL (ULTRAM) 50 MG TABLET    Take 1 tablet (50 mg total) by mouth every 8 (eight) hours as needed.   VITAMIN E PO    Take 1 tablet by mouth daily. 400 units  Modified Medications   No medications on file  Discontinued Medications   No medications on file    Review of Systems  Constitutional: Negative for fever, chills, diaphoresis, activity  change, appetite change and fatigue.  HENT: Negative for ear pain and sore throat.   Eyes: Negative for visual disturbance.  Respiratory: Negative for cough, chest tightness and shortness of breath.   Cardiovascular: Negative for chest pain, palpitations and leg swelling.  Gastrointestinal: Negative  for nausea, vomiting, abdominal pain, diarrhea, constipation and blood in stool.  Genitourinary: Negative for dysuria.  Musculoskeletal: Positive for joint swelling, arthralgias and gait problem.  Neurological: Negative for dizziness, tremors, numbness and headaches.  Psychiatric/Behavioral: Negative for sleep disturbance. The patient is not nervous/anxious.     Filed Vitals:   06/10/15 0904  BP: 120/80  Pulse: 68  Temp: 97.5 F (36.4 C)  TempSrc: Oral  Resp: 20  Height: 5' 6" (1.676 m)  Weight: 270 lb 12.8 oz (122.834 kg)  SpO2: 97%   Body mass index is 43.73 kg/(m^2).  Physical Exam  Constitutional: She is oriented to person, place, and time. She appears well-developed and well-nourished.  HENT:  Mouth/Throat: Oropharynx is clear and moist. No oropharyngeal exudate.  Eyes: Pupils are equal, round, and reactive to light. No scleral icterus.  Neck: Neck supple. Carotid bruit is not present. No tracheal deviation present. Thyromegaly present.  Cardiovascular: Normal rate, regular rhythm and intact distal pulses.  Exam reveals no gallop and no friction rub.   Murmur (1/6 SEM) heard. No LE edema b/l. no calf TTP.   Pulmonary/Chest: Effort normal and breath sounds normal. No stridor. No respiratory distress. She has no wheezes. She has no rales.  Abdominal: Soft. Bowel sounds are normal. She exhibits no distension and no mass. There is no hepatomegaly. There is no tenderness. There is no rebound and no guarding.  Musculoskeletal: She exhibits edema and tenderness.  Left knee with reduced ROM and TTP with antalgic gait  Lymphadenopathy:    She has no cervical adenopathy.  Neurological:  She is alert and oriented to person, place, and time. She has normal reflexes.  Skin: Skin is warm and dry. No rash noted.  Psychiatric: She has a normal mood and affect. Her behavior is normal. Judgment and thought content normal.     Labs reviewed: Appointment on 05/24/2015  Component Date Value Ref Range Status  . CD4 T Cell Abs 05/24/2015 780  400 - 2700 /uL Final  . CD4 % Helper T Cell 05/24/2015 33  33 - 55 % Final   Performed at [facility]  . HIV 1 RNA Quant 05/24/2015 38* <20 copies/mL Final  . HIV1 RNA Quant, Log 05/24/2015 1.58* <1.30 Log copies/mL Final   Comment:   This test was performed using the COBAS AmpliPrep/COBAS TaqMan HIV-1 test kit version 2.0. ([place].)   . WBC 05/24/2015 5.3  4.0 - 10.5 K/uL Final  . RBC 05/24/2015 4.62  3.87 - 5.11 MIL/uL Final  . Hemoglobin 05/24/2015 15.0  12.0 - 15.0 g/dL Final  . HCT 05/24/2015 42.5  36.0 - 46.0 % Final  . MCV 05/24/2015 92.0  78.0 - 100.0 fL Final  . MCH 05/24/2015 32.5  26.0 - 34.0 pg Final  . MCHC 05/24/2015 35.3  30.0 - 36.0 g/dL Final  . RDW 05/24/2015 14.3  11.5 - 15.5 % Final  . Platelets 05/24/2015 209  150 - 400 K/uL Final  . MPV 05/24/2015 10.0  8.6 - 12.4 fL Final  . Neutrophils Relative % 05/24/2015 47  43 - 77 % Final  . Neutro Abs 05/24/2015 2.5  1.7 - 7.7 K/uL Final  . Lymphocytes Relative 05/24/2015 41  12 - 46 % Final  . Lymphs Abs 05/24/2015 2.2  0.7 - 4.0 K/uL Final  . Monocytes Relative 05/24/2015 9  3 - 12 % Final  . Monocytes Absolute 05/24/2015 0.5  0.1 - 1.0 K/uL Final  . Eosinophils Relative  05/24/2015 2  0 - 5 % Final  . Eosinophils Absolute 05/24/2015 0.1  0.0 - 0.7 K/uL Final  . Basophils Relative 05/24/2015 1  0 - 1 % Final  . Basophils Absolute 05/24/2015 0.1  0.0 - 0.1 K/uL Final  . Smear Review 05/24/2015 Criteria for review not met   Final  . Sodium 05/24/2015 141  135 - 146 mmol/L Final  . Potassium 05/24/2015 3.9  3.5 - 5.3 mmol/L Final  .  Chloride 05/24/2015 101  98 - 110 mmol/L Final  . CO2 05/24/2015 29  20 - 31 mmol/L Final  . Glucose, Bld 05/24/2015 94  65 - 99 mg/dL Final  . BUN 05/24/2015 16  7 - 25 mg/dL Final  . Creat 05/24/2015 1.44* 0.50 - 1.05 mg/dL Final  . Total Bilirubin 05/24/2015 0.5  0.2 - 1.2 mg/dL Final  . Alkaline Phosphatase 05/24/2015 73  33 - 130 U/L Final  . AST 05/24/2015 15  10 - 35 U/L Final  . ALT 05/24/2015 14  6 - 29 U/L Final  . Total Protein 05/24/2015 7.5  6.1 - 8.1 g/dL Final  . Albumin 05/24/2015 3.8  3.6 - 5.1 g/dL Final  . Calcium 05/24/2015 10.2  8.6 - 10.4 mg/dL Final  . GFR, Est African American 05/24/2015 46* >=60 mL/min Final  . GFR, Est Non African American 05/24/2015 40* >=60 mL/min Final   Comment:   The estimated GFR is a calculation valid for adults (>=23 years old) that uses the CKD-EPI algorithm to adjust for age and sex. It is   not to be used for children, pregnant women, hospitalized patients,    patients on dialysis, or with rapidly changing kidney function. According to the NKDEP, eGFR >89 is normal, 60-89 shows mild impairment, 30-59 shows moderate impairment, 15-29 shows severe impairment and <15 is ESRD.     Marland Kitchen Creatinine, Urine 05/24/2015 224  20 - 320 mg/dL Final  . Microalb, Ur 05/24/2015 1.5  Not estab mg/dL Final  . Microalb Creat Ratio 05/24/2015 7  <30 mcg/mg creat Final   Comment: The ADA has defined abnormalities in albumin excretion as follows:           Category           Result                            (mcg/mg creatinine)                 Normal:    <30       Microalbuminuria:    30 - 299   Clinical albuminuria:    > or = 300   The ADA recommends that at least two of three specimens collected within a 3 - 6 month period be abnormal before considering a patient to be within a diagnostic category.     . Cholesterol 05/24/2015 182  125 - 200 mg/dL Final  . Triglycerides 05/24/2015 102  <150 mg/dL Final  . HDL 05/24/2015 55  >=46 mg/dL Final  .  Total CHOL/HDL Ratio 05/24/2015 3.3  <=5.0 Ratio Final  . VLDL 05/24/2015 20  <30 mg/dL Final  . LDL Cholesterol 05/24/2015 107  <130 mg/dL Final   Comment:   Total Cholesterol/HDL Ratio:CHD Risk                        Coronary Heart Disease Risk Table  Men       Women          1/2 Average Risk              3.4        3.3              Average Risk              5.0        4.4           2X Average Risk              9.6        7.1           3X Average Risk             23.4       11.0 Use the calculated Patient Ratio above and the CHD Risk table  to determine the patient's CHD Risk.   . RPR Ser Ql 05/24/2015 NON REAC  NON REAC Final  . Chlamydia 05/24/2015 Negative   Final   Normal Reference Range - Negative  . Neisseria gonorrhea 05/24/2015 Negative   Final   Normal Reference Range - Negative    No results found.   Assessment/Plan   ICD-9-CM ICD-10-CM   1. Primary osteoarthritis of both knees 715.16 M17.0 HYDROcodone-acetaminophen (NORCO) 5-325 MG tablet   L>R; stable   2. Essential hypertension - stable 401.9 I10   3. CKD (chronic kidney disease) stage 3, GFR 30-59 ml/min - stable 585.3 N18.3   4. Postablative hypothyroidism - stable 244.1 E89.0   5. Hyperglycemia - stable 790.29 R73.9   6. Human immunodeficiency virus (HIV) disease ([place]) - stable 042 B20     Follow up with Ortho as scheduled  Continue current medications as ordered  Follow up with Dr Tommy Medal as scheduled  Take pain pill as needed with food  Follow up in 6 mos with Janett Billow for [place]. Perlie Gold  [facility] [address] [place], [place] [address] [phone] Cell (Monday-Friday 8 AM - 5 PM) [phone] After 5 PM and follow prompts

## 2015-06-11 ENCOUNTER — Other Ambulatory Visit: Payer: Self-pay | Admitting: Endocrinology

## 2015-06-15 ENCOUNTER — Telehealth: Payer: Self-pay | Admitting: Endocrinology

## 2015-06-15 MED ORDER — LEVOTHYROXINE SODIUM 150 MCG PO TABS: ORAL_TABLET | ORAL | Status: DC

## 2015-06-15 NOTE — Telephone Encounter (Signed)
See note below and please advise if ok to refill. Last office visit was 03/25/2014. Thanks!

## 2015-06-15 NOTE — Telephone Encounter (Signed)
Rx submitted and appointment letter mailed to the pt.

## 2015-06-15 NOTE — Telephone Encounter (Signed)
Please refill x 1 Ov is due  

## 2015-06-15 NOTE — Telephone Encounter (Signed)
Patient need refill of levothyroxine send to                                    ( took last one today) [id number] [place], [place] - [address] [phone](Phone) [phone](Fax)

## 2015-07-06 ENCOUNTER — Encounter: Payer: Self-pay | Admitting: Endocrinology

## 2015-07-06 ENCOUNTER — Ambulatory Visit (INDEPENDENT_AMBULATORY_CARE_PROVIDER_SITE_OTHER): Payer: Commercial Managed Care - HMO | Admitting: Endocrinology

## 2015-07-06 VITALS — BP 132/87 | HR 81 | Temp 98.0°F | Ht 66.0 in | Wt 271.0 lb

## 2015-07-06 DIAGNOSIS — E890 Postprocedural hypothyroidism: Secondary | ICD-10-CM | POA: Diagnosis not present

## 2015-07-06 LAB — TSH: TSH: 1.58 u[IU]/mL (ref 0.35–4.50)

## 2015-07-06 MED ORDER — LEVOTHYROXINE SODIUM 150 MCG PO TABS
150.0000 ug | ORAL_TABLET | Freq: Every day | ORAL | Status: DC
Start: 2015-07-06 — End: 2015-12-20

## 2015-07-06 NOTE — Progress Notes (Signed)
Subjective:    Patient ID: Kayla Price, female    DOB: 06/08/1956, 60 y.o.   MRN: [id number]  HPI Pt returns for f/u of post-I-131 hypothyroidism (she had i-131 rx for hyperthyroidism due to grave's dz in 2009, and now takes synthroid, 150/day).  She says she takes synthroid as rx'ed.  pt states she feels well in general.   Past Medical History  Diagnosis Date  . HIV DISEASE 03/27/2006  . 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 Unspecified vitamin D deficiency 08/06/2007  . HYPERLIPIDEMIA, MIXED 12/15/2007  . HYPERTENSION 03/27/2006  . PVD 04/07/2007  . Alopecia areata 11/28/2009  . MENORRHAGIA, POSTMENOPAUSAL 02/03/2009  . TRIGGER FINGER 05/06/2008  . HIP FRACTURE, RIGHT 05/06/2008  . BREAST CANCER, HX OF 03/27/2006  . OSTEOARTHROSIS, LOCAL, SCND, UNSPC SITE 04/07/2007  . Fasting hyperglycemia   . Tinea capitis   . HIV (human immunodeficiency virus infection) ([place])   . Dry eye syndrome   . Bell's palsy   . Diabetes mellitus, controlled ([place]) 12/01/2014  . Osteoarthritis of left knee 06/08/2015  . CKD (chronic kidney disease) stage 3, GFR 30-59 ml/min 06/08/2015    Past Surgical History  Procedure Laterality Date  . Tubal ligation    . Breast lumpectomy    . Lymph node dissection  2005  . Port-a-cath removal  2006    insertion 2005  . Hysteroscopy  2006  . Thyroid surgery  2009    Ablation   . Colonoscopy      Social History   Social History  . Marital Status: Widowed    Spouse Name: N/A  . Number of Children: N/A  . Years of Education: N/A   Occupational History  . Tour manager    Social History Main Topics  . Smoking status: Never Smoker   . Smokeless tobacco: Never Used  . Alcohol Use: No  . Drug Use: No  . Sexual Activity: Not Currently     Comment: declined condoms   Other Topics Concern  . Not on file   Social History Narrative   Single/widow   Diet: good   Do you drink/eat things with caffeine? Tea occasionally   Marital status: Widowed   What year were you married? 1993   Do you live in a house, apartment,assisted living, condo,trailer,ect.)? House   Is it one or more stories? Two   How many persons live in your home? 4   Do you have any pets in you home? No   Current or past profession: Tour manager   Do you exercise? Yes  Type&how often: Stationary Bike, Water Exercise 3-4 x week   Do you have a living will? Yes   Do you have a DNR form? No  If not do you what one?   Do you have signed POA/HPOA forms? No   If so, please bring to your appointment.                   Current Outpatient Prescriptions on File Prior to Visit  Medication Sig Dispense Refill  . B Complex-C (SUPER B COMPLEX PO) Take 1 tablet by mouth daily. Reported on 06/08/2015    . Biotin 5000 MCG CAPS Take 5,000 mcg by mouth daily. Reported on 06/08/2015    . Cholecalciferol (D3 ADULT PO) Take 2,000 Units by mouth daily.    . Chromium Picolinate 800 MCG TABS Take 800 mcg by mouth daily. Reported on 06/08/2015    . darunavir-cobicistat (PREZCOBIX) 800-150 MG per tablet Take  1 tablet by mouth daily. Swallow whole. Do NOT crush, break or chew tablets. Take with food. 30 tablet 11  . dolutegravir (TIVICAY) 50 MG tablet Take 1 tablet (50 mg total) by mouth daily. 30 tablet 11  . HYDROcodone-acetaminophen (NORCO) 5-325 MG tablet Take 1 tablet by mouth every 6 (six) hours as needed for moderate pain. 60 tablet 0  . loratadine (CLARITIN) 10 MG tablet Take 10 mg by mouth daily as needed for itching. Reported on 06/08/2015    . olmesartan-hydrochlorothiazide (BENICAR HCT) 40-25 MG per tablet Take 1 tablet by mouth daily. 30 tablet 5  . Omega-3 Fatty Acids (FISH OIL) 1000 MG CAPS Take 1,000 mg by mouth daily.    Marland Kitchen POTASSIUM GLUCONATE PO Take 1 tablet by mouth daily. 595 mg    . Probiotic Product (PROBIOTIC COMPLEX ACIDOPHILUS) CAPS Take 1 capsule by mouth daily.    . rilpivirine (EDURANT) 25 MG TABS tablet Take 1 tablet (25 mg total) by mouth daily with breakfast.  30 tablet 11  . Tdap (BOOSTRIX) 5-2.5-18.5 LF-MCG/0.5 injection Inject 0.5 mLs into the muscle once. 0.5 mL 0  . VITAMIN E PO Take 1 tablet by mouth daily. 400 units    . Chlorphen-Pseudoephed-APAP (CORICIDIN D PO) Take by mouth. Reported on 07/06/2015    . Evening Primrose Oil 1000 MG CAPS Take 1,000 capsules by mouth daily. Reported on 07/06/2015    . traMADol (ULTRAM) 50 MG tablet Take 1 tablet (50 mg total) by mouth every 8 (eight) hours as needed. (Patient not taking: Reported on 07/06/2015) 90 tablet 3   No current facility-administered medications on file prior to visit.    Allergies  Allergen Reactions  . Lisinopril Swelling    Swelling of tongue and mouth    Family History  Problem Relation Age of Onset  . Heart attack Brother     Massive MI in 3s  . Stroke Brother     CAD  . Kidney disease Mother   . Stroke Mother   . Diabetes Mother   . Liver disease Sister   . COPD Sister     had I-131 rx of hyperthyroidism  . Diabetes Sister   . Stroke Sister   . Cancer Paternal Uncle   . Cancer Cousin   . Heart failure Father   . Heart disease Father   . Arthritis Father   . Sarcoidosis Sister     BP 132/87 mmHg  Pulse 81  Temp(Src) 98 F (36.7 C) (Oral)  Ht '5\' 6"'$  (1.676 m)  Wt 271 lb (122.925 kg)  BMI 43.76 kg/m2  SpO2 98%  Review of Systems No weight change.     Objective:   Physical Exam VITAL SIGNS:  See vs page GENERAL: no distress head: no deformity eyes: bilat proptosis is noted (L>R) external nose and ears are normal.   NECK: There is no palpable thyroid enlargement.  No thyroid nodule is palpable.  No palpable lymphadenopathy at the anterior neck.   Lab Results  Component Value Date   TSH 1.58 07/06/2015   T3TOTAL 271.2* 12/15/2007   T4TOTAL 13.9* 12/15/2007       Assessment & Plan:  Hypothyroidism: well-replaced.   Patient is advised the following: Patient Instructions  blood tests are requested for you today.  We'll let you know about the  results.   Please return in 1 year.      Levothyroxine oral capsules What is this medicine? LEVOTHYROXINE (lee voe thye ROX een) is a thyroid hormone. This  medicine can improve symptoms of thyroid deficiency such as slow speech, lack of energy, weight gain, hair loss, dry skin, and feeling cold. It also helps to treat goiter (an enlarged thyroid gland). It is also used to treat some kinds of thyroid cancer along with surgery and other medicines. This medicine may be used for other purposes; ask your health care provider or pharmacist if you have questions. What should I tell my health care provider before I take this medicine? They need to know if you have any of these conditions: -angina -blood clotting problems -diabetes -dieting or on a weight loss program -fertility problems -heart disease -high levels of thyroid hormone -pituitary gland problem -previous heart attack -an unusual or allergic reaction to levothyroxine, thyroid hormones, other medicines, foods, dyes, or preservatives -pregnant or trying to get pregnant -breast-feeding How should I use this medicine? Take this medicine by mouth with a glass of water. It is best to take on an empty stomach, at least 30 minutes to one hour before breakfast. Avoid taking antacids containing aluminum or magnesium, simethicone, bile acid sequestrants, calcium carbonate, sodium polystyrene sulfonate, ferrous sulfate, and sucralfate within 4 hours of taking this medicine. Do not cut, crush or chew this medicine. Follow the directions on the prescription label. Take at the same time each day. Do not take your medicine more often than directed. Talk to your pediatrician regarding the use of this medicine in children. While this drug may be prescribed for selected conditions, precautions do apply. Since the capsules cannot be crushed or placed in water, they may only be given to infants and children who are able to swallow an intact  capsule. Overdosage: If you think you have taken too much of this medicine contact a poison control center or emergency room at once. NOTE: This medicine is only for you. Do not share this medicine with others. What if I miss a dose? If you miss a dose, take it as soon as you can. If it is almost time for your next dose, take only that dose. Do not take double or extra doses. What may interact with this medicine? -amiodarone -antacids -anti-thyroid medicines -calcium supplements -carbamazepine -cholestyramine -colestipol -digoxin -female hormones, including contraceptive or birth control pills -iron supplements -ketamine -liquid nutrition products like Ensure -medicines for colds and breathing difficulties -medicines for diabetes -medicines for mental depression -medicines or herbals used to decrease weight or appetite -phenobarbital or other barbiturate medications -phenytoin -prednisone or other corticosteroids -rifabutin -rifampin -simethicone -sodium polystyrene sulfonate -soy isoflavones -sucralfate -theophylline -warfarin This list may not describe all possible interactions. Give your health care provider a list of all the medicines, herbs, non-prescription drugs, or dietary supplements you use. Also tell them if you smoke, drink alcohol, or use illegal drugs. Some items may interact with your medicine. What should I watch for while using this medicine? Do not switch brands of this medicine unless your health care professional agrees with the change. Ask questions if you are uncertain. You will need regular exams and occasional blood tests to check the response to treatment. If you are receiving this medicine for an underactive thyroid, it may be several weeks before you notice an improvement. Check with your doctor or health care professional if your symptoms do not improve. It may be necessary for you to take this medicine for the rest of your life. Do not stop using this  medicine unless your doctor or health care professional advises you to. This medicine can affect blood sugar  levels. If you have diabetes, check your blood sugar as directed. You may lose some of your hair when you first start treatment. With time, this usually corrects itself. If you are going to have surgery, tell your doctor or health care professional that you are taking this medicine. What side effects may I notice from receiving this medicine? Side effects that you should report to your doctor or health care professional as soon as possible: -allergic reactions like skin rash, itching or hives, swelling of the face, lips, or tongue -chest pain -excessive sweating or intolerance to heat -fast or irregular heartbeat -nervousness -swelling of ankles, feet, or legs -tremors Side effects that usually do not require medical attention (Report these to your doctor or health care professional if they continue or are bothersome.): -changes in appetite -changes in menstrual periods -diarrhea -hair loss -headache -trouble sleeping -weight loss This list may not describe all possible side effects. Call your doctor for medical advice about side effects. You may report side effects to FDA at 1-800-FDA-1088. Where should I keep my medicine? Keep out of the reach of children. Store at room temperature between 15 and 30 degrees C (59 and 86 degrees F). Protect from light and moisture. Keep container tightly closed. Throw away any unused medicine after the expiration date. NOTE: This sheet is a summary. It may not cover all possible information. If you have questions about this medicine, talk to your doctor, pharmacist, or health care provider.    2016, [employer] Standard. (2008-09-02 15:25:58)

## 2015-07-06 NOTE — Patient Instructions (Addendum)
blood tests are requested for you today.  We'll let you know about the results.   Please return in 1 year.      Levothyroxine oral capsules What is this medicine? LEVOTHYROXINE (lee voe thye ROX een) is a thyroid hormone. This medicine can improve symptoms of thyroid deficiency such as slow speech, lack of energy, weight gain, hair loss, dry skin, and feeling cold. It also helps to treat goiter (an enlarged thyroid gland). It is also used to treat some kinds of thyroid cancer along with surgery and other medicines. This medicine may be used for other purposes; ask your health care provider or pharmacist if you have questions. What should I tell my health care provider before I take this medicine? They need to know if you have any of these conditions: -angina -blood clotting problems -diabetes -dieting or on a weight loss program -fertility problems -heart disease -high levels of thyroid hormone -pituitary gland problem -previous heart attack -an unusual or allergic reaction to levothyroxine, thyroid hormones, other medicines, foods, dyes, or preservatives -pregnant or trying to get pregnant -breast-feeding How should I use this medicine? Take this medicine by mouth with a glass of water. It is best to take on an empty stomach, at least 30 minutes to one hour before breakfast. Avoid taking antacids containing aluminum or magnesium, simethicone, bile acid sequestrants, calcium carbonate, sodium polystyrene sulfonate, ferrous sulfate, and sucralfate within 4 hours of taking this medicine. Do not cut, crush or chew this medicine. Follow the directions on the prescription label. Take at the same time each day. Do not take your medicine more often than directed. Talk to your pediatrician regarding the use of this medicine in children. While this drug may be prescribed for selected conditions, precautions do apply. Since the capsules cannot be crushed or placed in water, they may only be given to  infants and children who are able to swallow an intact capsule. Overdosage: If you think you have taken too much of this medicine contact a poison control center or emergency room at once. NOTE: This medicine is only for you. Do not share this medicine with others. What if I miss a dose? If you miss a dose, take it as soon as you can. If it is almost time for your next dose, take only that dose. Do not take double or extra doses. What may interact with this medicine? -amiodarone -antacids -anti-thyroid medicines -calcium supplements -carbamazepine -cholestyramine -colestipol -digoxin -female hormones, including contraceptive or birth control pills -iron supplements -ketamine -liquid nutrition products like Ensure -medicines for colds and breathing difficulties -medicines for diabetes -medicines for mental depression -medicines or herbals used to decrease weight or appetite -phenobarbital or other barbiturate medications -phenytoin -prednisone or other corticosteroids -rifabutin -rifampin -simethicone -sodium polystyrene sulfonate -soy isoflavones -sucralfate -theophylline -warfarin This list may not describe all possible interactions. Give your health care provider a list of all the medicines, herbs, non-prescription drugs, or dietary supplements you use. Also tell them if you smoke, drink alcohol, or use illegal drugs. Some items may interact with your medicine. What should I watch for while using this medicine? Do not switch brands of this medicine unless your health care professional agrees with the change. Ask questions if you are uncertain. You will need regular exams and occasional blood tests to check the response to treatment. If you are receiving this medicine for an underactive thyroid, it may be several weeks before you notice an improvement. Check with your doctor or health care professional  if your symptoms do not improve. It may be necessary for you to take this  medicine for the rest of your life. Do not stop using this medicine unless your doctor or health care professional advises you to. This medicine can affect blood sugar levels. If you have diabetes, check your blood sugar as directed. You may lose some of your hair when you first start treatment. With time, this usually corrects itself. If you are going to have surgery, tell your doctor or health care professional that you are taking this medicine. What side effects may I notice from receiving this medicine? Side effects that you should report to your doctor or health care professional as soon as possible: -allergic reactions like skin rash, itching or hives, swelling of the face, lips, or tongue -chest pain -excessive sweating or intolerance to heat -fast or irregular heartbeat -nervousness -swelling of ankles, feet, or legs -tremors Side effects that usually do not require medical attention (Report these to your doctor or health care professional if they continue or are bothersome.): -changes in appetite -changes in menstrual periods -diarrhea -hair loss -headache -trouble sleeping -weight loss This list may not describe all possible side effects. Call your doctor for medical advice about side effects. You may report side effects to FDA at 1-800-FDA-1088. Where should I keep my medicine? Keep out of the reach of children. Store at room temperature between 15 and 30 degrees C (59 and 86 degrees F). Protect from light and moisture. Keep container tightly closed. Throw away any unused medicine after the expiration date. NOTE: This sheet is a summary. It may not cover all possible information. If you have questions about this medicine, talk to your doctor, pharmacist, or health care provider.    2016, [employer]. (2008-09-02 15:25:58)

## 2015-07-11 ENCOUNTER — Other Ambulatory Visit: Payer: Self-pay | Admitting: Endocrinology

## 2015-07-14 ENCOUNTER — Other Ambulatory Visit: Payer: Self-pay | Admitting: Endocrinology

## 2015-07-18 ENCOUNTER — Other Ambulatory Visit: Payer: Self-pay

## 2015-07-18 DIAGNOSIS — Z1231 Encounter for screening mammogram for malignant neoplasm of breast: Secondary | ICD-10-CM

## 2015-08-15 ENCOUNTER — Other Ambulatory Visit: Payer: Self-pay | Admitting: *Deleted

## 2015-08-15 DIAGNOSIS — M170 Bilateral primary osteoarthritis of knee: Secondary | ICD-10-CM

## 2015-08-15 MED ORDER — HYDROCODONE-ACETAMINOPHEN 5-325 MG PO TABS: 1.0000 | ORAL_TABLET | Freq: Four times a day (QID) | ORAL | Status: DC | PRN

## 2015-08-15 NOTE — Telephone Encounter (Signed)
Patient requested and will pick up . Narcotic Contract printed.

## 2015-08-22 ENCOUNTER — Ambulatory Visit
Admission: RE | Admit: 2015-08-22 | Discharge: 2015-08-22 | Disposition: A | Discharge status: Home / Self Care | Payer: Commercial Managed Care - HMO | Source: Ambulatory Visit

## 2015-08-22 DIAGNOSIS — Z1231 Encounter for screening mammogram for malignant neoplasm of breast: Secondary | ICD-10-CM

## 2015-09-09 ENCOUNTER — Ambulatory Visit
Admission: RE | Admit: 2015-09-09 | Discharge: 2015-09-09 | Disposition: A | Discharge status: Home / Self Care | Payer: Commercial Managed Care - HMO | Source: Ambulatory Visit

## 2015-09-09 DIAGNOSIS — Z1231 Encounter for screening mammogram for malignant neoplasm of breast: Secondary | ICD-10-CM | POA: Diagnosis not present

## 2015-09-10 ENCOUNTER — Other Ambulatory Visit: Payer: Self-pay | Admitting: Infectious Disease

## 2015-10-10 ENCOUNTER — Other Ambulatory Visit: Payer: Self-pay | Admitting: Endocrinology

## 2015-10-25 ENCOUNTER — Other Ambulatory Visit: Payer: Self-pay | Admitting: Infectious Disease

## 2015-10-25 DIAGNOSIS — B20 Human immunodeficiency virus [HIV] disease: Secondary | ICD-10-CM

## 2015-10-27 ENCOUNTER — Other Ambulatory Visit: Payer: Self-pay

## 2015-10-27 DIAGNOSIS — M170 Bilateral primary osteoarthritis of knee: Secondary | ICD-10-CM

## 2015-10-27 MED ORDER — HYDROCODONE-ACETAMINOPHEN 5-325 MG PO TABS: 1.0000 | ORAL_TABLET | Freq: Four times a day (QID) | ORAL | Status: DC | PRN

## 2015-10-27 NOTE — Telephone Encounter (Signed)
Patient called to find out when she could get a refill on hydrocodone. She has no had a refill since Feb 20. I printed Rx and placed it in Dr. Cyndi Lennert folder for signing.   Patient has been notified to pick up Rx. She said that she would pick it up later today.

## 2015-11-03 DIAGNOSIS — M170 Bilateral primary osteoarthritis of knee: Secondary | ICD-10-CM | POA: Diagnosis not present

## 2015-11-08 DIAGNOSIS — M1712 Unilateral primary osteoarthritis, left knee: Secondary | ICD-10-CM | POA: Diagnosis not present

## 2015-11-09 ENCOUNTER — Other Ambulatory Visit: Payer: Commercial Managed Care - HMO

## 2015-11-10 ENCOUNTER — Other Ambulatory Visit: Payer: Commercial Managed Care - HMO

## 2015-11-10 DIAGNOSIS — B20 Human immunodeficiency virus [HIV] disease: Secondary | ICD-10-CM

## 2015-11-10 LAB — CBC WITH DIFFERENTIAL/PLATELET
BASOS PCT: 0 %
Basophils Absolute: 0 cells/uL (ref 0–200)
EOS PCT: 2 %
Eosinophils Absolute: 94 cells/uL (ref 15–500)
HEMATOCRIT: 45.9 % — AB (ref 35.0–45.0)
HEMOGLOBIN: 15.3 g/dL (ref 11.7–15.5)
LYMPHS ABS: 2068 {cells}/uL (ref 850–3900)
LYMPHS PCT: 44 %
MCH: 31.5 pg (ref 27.0–33.0)
MCHC: 33.3 g/dL (ref 32.0–36.0)
MCV: 94.6 fL (ref 80.0–100.0)
MONO ABS: 423 {cells}/uL (ref 200–950)
MPV: 9.9 fL (ref 7.5–12.5)
Monocytes Relative: 9 %
Neutro Abs: 2115 cells/uL (ref 1500–7800)
Neutrophils Relative %: 45 %
Platelets: 200 10*3/uL (ref 140–400)
RBC: 4.85 MIL/uL (ref 3.80–5.10)
RDW: 14.3 % (ref 11.0–15.0)
WBC: 4.7 10*3/uL (ref 3.8–10.8)

## 2015-11-10 LAB — COMPLETE METABOLIC PANEL WITH GFR
ALT: 14 U/L (ref 6–29)
AST: 20 U/L (ref 10–35)
Albumin: 3.9 g/dL (ref 3.6–5.1)
Alkaline Phosphatase: 51 U/L (ref 33–130)
BUN: 17 mg/dL (ref 7–25)
CALCIUM: 10.6 mg/dL — AB (ref 8.6–10.4)
CHLORIDE: 102 mmol/L (ref 98–110)
CO2: 28 mmol/L (ref 20–31)
Creat: 1.38 mg/dL — ABNORMAL HIGH (ref 0.50–1.05)
GFR, EST NON AFRICAN AMERICAN: 42 mL/min — AB (ref >=60)
GFR, Est African American: 48 mL/min — ABNORMAL LOW (ref >=60)
Glucose, Bld: 82 mg/dL (ref 65–99)
POTASSIUM: 4.8 mmol/L (ref 3.5–5.3)
SODIUM: 141 mmol/L (ref 135–146)
Total Bilirubin: 0.8 mg/dL (ref 0.2–1.2)
Total Protein: 7.4 g/dL (ref 6.1–8.1)

## 2015-11-11 LAB — T-HELPER CELL (CD4) - (RCID CLINIC ONLY)
CD4 % Helper T Cell: 35 % (ref 33–55)
CD4 T Cell Abs: 810 /uL (ref 400–2700)

## 2015-11-14 LAB — HIV-1 RNA QUANT-NO REFLEX-BLD

## 2015-11-23 ENCOUNTER — Ambulatory Visit (INDEPENDENT_AMBULATORY_CARE_PROVIDER_SITE_OTHER): Payer: Commercial Managed Care - HMO | Admitting: Infectious Disease

## 2015-11-23 ENCOUNTER — Encounter: Payer: Self-pay | Admitting: Infectious Disease

## 2015-11-23 VITALS — BP 112/77 | HR 76 | Temp 98.3°F | Wt 265.0 lb

## 2015-11-23 DIAGNOSIS — B20 Human immunodeficiency virus [HIV] disease: Secondary | ICD-10-CM

## 2015-11-23 DIAGNOSIS — I10 Essential (primary) hypertension: Secondary | ICD-10-CM

## 2015-11-23 DIAGNOSIS — M1712 Unilateral primary osteoarthritis, left knee: Secondary | ICD-10-CM

## 2015-11-23 DIAGNOSIS — M179 Osteoarthritis of knee, unspecified: Secondary | ICD-10-CM | POA: Diagnosis not present

## 2015-11-23 DIAGNOSIS — N183 Chronic kidney disease, stage 3 unspecified: Secondary | ICD-10-CM

## 2015-11-23 NOTE — Progress Notes (Signed)
Chief complaint: left knee pain Subjective:    Patient ID: Kayla Price, female    DOB: [birth year], 60 y.o.   MRN: [id number]  HPI   Kayla Price is a 60 y.o. female who is doing superbly well on her  antiviral regimen, of Tivicay, Prezcobix and Edurant with undetectable viral load and health cd4 count.   At prior [facility] and I reviewed her  HIV and pulled in ALL of the R mutations noted in Dr Ruffin Frederick notes and we had neglected to mention a 41L which when added to all of her other nutation is essentially takes out all the and NRTI's and led to change from prior regimen of Isentress, Atripla to current one.  HIVdb: Genotypic Resistance Interpretation Algorithm  Date: 27-Oct-2014 17:15:11 UTC   Drug Resistance Interpretation: PR PI Major Resistance Mutations: None PI Minor Resistance Mutations: None Other Mutations: V77I Protease Inhibitors atazanavir/r (ATV/r) Susceptible darunavir/r (DRV/r) Susceptible fosamprenavir/r (FPV/r) Susceptible indinavir/r (IDV/r) Susceptible lopinavir/r (LPV/r) Susceptible nelfinavir (NFV) Susceptible saquinavir/r (SQV/r) Susceptible tipranavir/r (TPV/r) Susceptible PR Comments  Drug Resistance Interpretation: RT NRTI Resistance Mutations: M41L, D67N, M184V, L210W, T215Y NNRTI Resistance Mutations: None Other Mutations: E44D, V118I Nucleoside RTI lamivudine (3TC) High-level resistance abacavir (ABC) High-level resistance zidovudine (AZT) High-level resistance stavudine (D4T) High-level resistance didanosine (DDI) High-level resistance emtricitabine (FTC) High-level resistance tenofovir (TDF) High-level resistance Non-Nucleoside RTI efavirenz (EFV) Susceptible etravirine (ETR) Susceptible nevirapine (NVP) Susceptible rilpivirine (RPV) Susceptible   Lab Results  Component Value Date   HIV1RNAQUANT <20 11/10/2015   HIV1RNAQUANT 38* 05/24/2015   HIV1RNAQUANT <20 12/01/2014     Lab Results  Component Value Date   CD4TABS 810  11/10/2015   CD4TABS 780 05/24/2015   CD4TABS 790 12/01/2014   She is having significant left knee pain and is wanting to undergo L TKA this summer.  She is trying to lose weight to get BMI to 40 per Dr. Shaune Spittle' request prior to TKA.    Past Medical History  Diagnosis Date  . HIV DISEASE 03/27/2006  . 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 Unspecified vitamin D deficiency 08/06/2007  . HYPERLIPIDEMIA, MIXED 12/15/2007  . HYPERTENSION 03/27/2006  . PVD 04/07/2007  . Alopecia areata 11/28/2009  . MENORRHAGIA, POSTMENOPAUSAL 02/03/2009  . TRIGGER FINGER 05/06/2008  . HIP FRACTURE, RIGHT 05/06/2008  . BREAST CANCER, HX OF 03/27/2006  . OSTEOARTHROSIS, LOCAL, SCND, UNSPC SITE 04/07/2007  . Fasting hyperglycemia   . Tinea capitis   . HIV (human immunodeficiency virus infection) ([place])   . Dry eye syndrome   . Bell's palsy   . Diabetes mellitus, controlled ([place]) 12/01/2014  . Osteoarthritis of left knee 06/08/2015  . CKD (chronic kidney disease) stage 3, GFR 30-59 ml/min 06/08/2015    Past Surgical History  Procedure Laterality Date  . Tubal ligation    . Breast lumpectomy    . Lymph node dissection  2005  . Port-a-cath removal  2006    insertion 2005  . Hysteroscopy  2006  . Thyroid surgery  2009    Ablation   . Colonoscopy      Family History  Problem Relation Age of Onset  . Heart attack Brother     Massive MI in 78s  . Stroke Brother     CAD  . Kidney disease Mother   . Stroke Mother   . Diabetes Mother   . Liver disease Sister   . COPD Sister     had I-131 rx of hyperthyroidism  . Diabetes Sister   . Stroke  Sister   . Cancer Paternal Uncle   . Cancer Cousin   . Heart failure Father   . Heart disease Father   . Arthritis Father   . Sarcoidosis Sister       Social History   Social History  . Marital Status: Widowed    Spouse Name: N/A  . Number of Children: N/A  . Years of Education: N/A   Occupational History  . Tour manager    Social History  Main Topics  . Smoking status: Never Smoker   . Smokeless tobacco: Never Used  . Alcohol Use: No  . Drug Use: No  . Sexual Activity: Not Currently     Comment: declined condoms   Other Topics Concern  . None   Social History Narrative   Single/widow   Diet: good   Do you drink/eat things with caffeine? Tea occasionally   Marital status: Widowed  What year were you married? 1993   Do you live in a house, apartment,assisted living, condo,trailer,ect.)? House   Is it one or more stories? Two   How many persons live in your home? 4   Do you have any pets in you home? No   Current or past profession: Tour manager   Do you exercise? Yes  Type&how often: Stationary Bike, Water Exercise 3-4 x week   Do you have a living will? Yes   Do you have a DNR form? No  If not do you what one?   Do you have signed POA/HPOA forms? No   If so, please bring to your appointment.                   Allergies  Allergen Reactions  . Lisinopril Swelling    Swelling of tongue and mouth     Current outpatient prescriptions:  .  B Complex-C (SUPER B COMPLEX PO), Take 1 tablet by mouth daily. Reported on 06/08/2015, Disp: , Rfl:  .  Biotin 5000 MCG CAPS, Take 5,000 mcg by mouth daily. Reported on 06/08/2015, Disp: , Rfl:  .  Chlorphen-Pseudoephed-APAP (CORICIDIN D PO), Take by mouth. Reported on 07/06/2015, Disp: , Rfl:  .  Cholecalciferol (D3 ADULT PO), Take 2,000 Units by mouth daily., Disp: , Rfl:  .  Chromium Picolinate 800 MCG TABS, Take 800 mcg by mouth daily. Reported on 06/08/2015, Disp: , Rfl:  .  co-enzyme Q-10 30 MG capsule, Take 30 mg by mouth 3 (three) times daily., Disp: , Rfl:  .  EDURANT 25 MG TABS tablet, TAKE 1 TABLET BY MOUTH EVERY DAY WITH BREAKFAST, Disp: 30 tablet, Rfl: 11 .  Evening Primrose Oil 1000 MG CAPS, Take 1,000 capsules by mouth daily. Reported on 07/06/2015, Disp: , Rfl:  .  HYDROcodone-acetaminophen (NORCO) 5-325 MG tablet, Take 1 tablet by mouth every 6 (six) hours  as needed for moderate pain., Disp: 60 tablet, Rfl: 0 .  levothyroxine (SYNTHROID, LEVOTHROID) 150 MCG tablet, Take 1 tablet (150 mcg total) by mouth daily., Disp: 30 tablet, Rfl: 0 .  levothyroxine (SYNTHROID, LEVOTHROID) 150 MCG tablet, TAKE 1 TABLET (150 MCG TOTAL) BY MOUTH DAILY BEFORE BREAKFAST., Disp: 30 tablet, Rfl: 2 .  loratadine (CLARITIN) 10 MG tablet, Take 10 mg by mouth daily as needed for itching. Reported on 06/08/2015, Disp: , Rfl:  .  olmesartan-hydrochlorothiazide (BENICAR HCT) 40-25 MG tablet, TAKE 1 TABLET BY MOUTH DAILY., Disp: 30 tablet, Rfl: 5 .  Omega-3 Fatty Acids (FISH OIL) 1000 MG CAPS, Take 1,000 mg by mouth daily.,  Disp: , Rfl:  .  POTASSIUM GLUCONATE PO, Take 1 tablet by mouth daily. 595 mg, Disp: , Rfl:  .  PREZCOBIX 800-150 MG tablet, TAKE 1 TABLET BY MOUTH DAILY. SWALLOW WHOLE. DO NOT CRUSH, BREAK OR CHEW TABLETS. TAKE WITH FOOD., Disp: 30 tablet, Rfl: 11 .  Probiotic Product (PROBIOTIC COMPLEX ACIDOPHILUS) CAPS, Take 1 capsule by mouth daily., Disp: , Rfl:  .  Tdap (BOOSTRIX) 5-2.5-18.5 LF-MCG/0.5 injection, Inject 0.5 mLs into the muscle once., Disp: 0.5 mL, Rfl: 0 .  TIVICAY 50 MG tablet, TAKE 1 TABLET BY MOUTH EVERY DAY, Disp: 30 tablet, Rfl: 11 .  traMADol (ULTRAM) 50 MG tablet, Take 1 tablet (50 mg total) by mouth every 8 (eight) hours as needed., Disp: 90 tablet, Rfl: 3 .  VITAMIN E PO, Take 1 tablet by mouth daily. 400 units, Disp: , Rfl:    Review of Systems  Constitutional: Negative for fever, chills, diaphoresis, activity change, appetite change, fatigue and unexpected weight change.  HENT: Negative for congestion, postnasal drip, rhinorrhea, sinus pressure, sneezing, sore throat and trouble swallowing.   Eyes: Negative for photophobia and visual disturbance.  Respiratory: Negative for cough, chest tightness, shortness of breath, wheezing and stridor.   Cardiovascular: Negative for chest pain, palpitations and leg swelling.  Gastrointestinal: Negative  for nausea, vomiting, abdominal pain, diarrhea, constipation, blood in stool, abdominal distention and anal bleeding.  Genitourinary: Negative for dysuria, hematuria, flank pain and difficulty urinating.  Musculoskeletal: Positive for arthralgias. Negative for myalgias, back pain, joint swelling and gait problem.  Skin: Negative for pallor, rash and wound.  Neurological: Negative for dizziness, tremors, weakness and light-headedness.  Hematological: Negative for adenopathy. Does not bruise/bleed easily.  Psychiatric/Behavioral: Negative for behavioral problems, confusion, sleep disturbance, dysphoric mood, decreased concentration and agitation.       Objective:   Physical Exam  Constitutional: She is oriented to person, place, and time. She appears well-developed and well-nourished. No distress.  HENT:  Head: Normocephalic and atraumatic.  Mouth/Throat: Oropharynx is clear and moist. No oropharyngeal exudate.  Eyes: Conjunctivae and EOM are normal. Pupils are equal, round, and reactive to light. No scleral icterus.  Neck: Normal range of motion. Neck supple. No JVD present.  Cardiovascular: Normal rate and regular rhythm.   Pulmonary/Chest: Effort normal. No respiratory distress. She has no wheezes.  Abdominal: Soft. She exhibits no distension.  Musculoskeletal: She exhibits edema.  Lymphadenopathy:    She has no cervical adenopathy.  Neurological: She is alert and oriented to person, place, and time. She exhibits normal muscle tone. Coordination normal.  Skin: Skin is warm and dry. She is not diaphoretic. No erythema. No pallor.  Psychiatric: She has a normal mood and affect. Her behavior is normal. Judgment and thought content normal.          Assessment & Plan:   HIV DISEASE   Continue current regimen and RTC in 6 months     HYPERTENSION : well controlled Filed Vitals:   11/23/15 1024  BP: 112/77  Pulse: 76  Temp: 98.3 F (36.8 C)    Reasonable control  Followed  by Dr Dewaine Oats  Morbid obesity: to try to cut calories, carbohydrates  and to followup with Dr. Dewaine Oats  HYPOTHYROIDISM, POST-RADIATION  Followed by Dr. Loanne Drilling,.   Hyperlipidemia: LDL at 99. Would STILL consider adding statin   Osteoarthritis: left knee to be replaced in May  CKD:  Lab Results  Component Value Date   CREATININE 1.38* 11/10/2015   CREATININE 1.44* 05/24/2015  CREATININE 1.30* 12/01/2014   Obesity: losing weight via exercise  OA: to undergo TKA if she can lose requisite weight  CV risk: consider for REPRIEVE    I spent greater than 40 minutes with the patient including greater than 50% of time in face to face counsel of the patient re her HIV, her Hypothyroidism, HTN, CKD, OA, obesity,CV risk and in coordination of her care.

## 2015-11-29 ENCOUNTER — Encounter (INDEPENDENT_AMBULATORY_CARE_PROVIDER_SITE_OTHER): Payer: Commercial Managed Care - HMO | Admitting: *Deleted

## 2015-11-29 ENCOUNTER — Other Ambulatory Visit: Payer: Self-pay | Admitting: Infectious Disease

## 2015-11-29 VITALS — BP 113/81 | HR 60 | Temp 97.8°F | Resp 16 | Ht 65.5 in | Wt 268.0 lb

## 2015-11-29 DIAGNOSIS — Z006 Encounter for examination for normal comparison and control in clinical research program: Secondary | ICD-10-CM

## 2015-11-29 DIAGNOSIS — Z79899 Other long term (current) drug therapy: Secondary | ICD-10-CM | POA: Diagnosis not present

## 2015-11-29 LAB — COMPREHENSIVE METABOLIC PANEL
ALBUMIN: 4.2 g/dL (ref 3.6–5.1)
ALK PHOS: 56 U/L (ref 33–130)
ALT: 21 U/L (ref 6–29)
AST: 24 U/L (ref 10–35)
BILIRUBIN TOTAL: 0.4 mg/dL (ref 0.2–1.2)
BUN: 24 mg/dL (ref 7–25)
CALCIUM: 10.2 mg/dL (ref 8.6–10.4)
CO2: 25 mmol/L (ref 20–31)
CREATININE: 1.48 mg/dL — AB (ref 0.50–1.05)
Chloride: 101 mmol/L (ref 98–110)
Glucose, Bld: 93 mg/dL (ref 65–99)
Potassium: 3.5 mmol/L (ref 3.5–5.3)
Sodium: 140 mmol/L (ref 135–146)
TOTAL PROTEIN: 7.7 g/dL (ref 6.1–8.1)

## 2015-11-29 NOTE — Progress Notes (Signed)
Kayla Price is here today for a screening visit for Reprieve, A Randomized Trial to Prevent Vascular Events in HIV (study drug is Pitavastatin '4mg'$  or placebo). Informed consent was obtained after reviewing the consent with her and answering all her questions. A copy of the consent was given to her. She understands it is voluntary and she will be randomized to either take pitavastatin or a placebo qd. She currently has left knee osteoarthritis and is waiting on surgery for a knee replacement. She has hypertension and takes benicar for that.  Bp today was 113/81. She also has chronic dry rt eye. She has a history of breast cancer with tx in 2005. Prior history of hyperglycemia which is normal now. 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 Denies any other cardiac history. Entry is tentatively scheduled for next Wednesday.

## 2015-11-30 LAB — LIPID PANEL
Cholesterol: 132 mg/dL (ref 125–200)
HDL: 57 mg/dL (ref >=46)
LDL CALC: 58 mg/dL (ref <130)
Total CHOL/HDL Ratio: 2.3 Ratio (ref <=5.0)
Triglycerides: 87 mg/dL (ref <150)
VLDL: 17 mg/dL (ref <30)

## 2015-11-30 NOTE — Progress Notes (Signed)
I hope not

## 2015-11-30 NOTE — Addendum Note (Signed)
Addended by: Bobbie Stack on: 11/30/2015 08:13 AM   Modules accepted: Orders

## 2015-12-07 ENCOUNTER — Encounter (INDEPENDENT_AMBULATORY_CARE_PROVIDER_SITE_OTHER): Payer: Self-pay | Admitting: *Deleted

## 2015-12-07 ENCOUNTER — Other Ambulatory Visit: Payer: Self-pay | Admitting: *Deleted

## 2015-12-07 VITALS — BP 112/75 | HR 60 | Temp 97.7°F | Resp 16 | Ht 65.5 in | Wt 261.5 lb

## 2015-12-07 DIAGNOSIS — I10 Essential (primary) hypertension: Secondary | ICD-10-CM

## 2015-12-07 DIAGNOSIS — Z006 Encounter for examination for normal comparison and control in clinical research program: Secondary | ICD-10-CM

## 2015-12-07 DIAGNOSIS — M170 Bilateral primary osteoarthritis of knee: Secondary | ICD-10-CM

## 2015-12-07 MED ORDER — HYDROCODONE-ACETAMINOPHEN 5-325 MG PO TABS: 1.0000 | ORAL_TABLET | Freq: Four times a day (QID) | ORAL | Status: DC | PRN

## 2015-12-07 MED ORDER — OLMESARTAN MEDOXOMIL-HCTZ 40-25 MG PO TABS: 1.0000 | ORAL_TABLET | Freq: Every day | ORAL | Status: DC

## 2015-12-07 NOTE — Progress Notes (Signed)
Kayla Price was here today to enroll in Reprieve, A Randomized Trial to Prevent Vascular Events in HIV (study drug is Pitavastatin '4mg'$  or placebo). After eligibility was verified we randomized her to receive either pitavastatin '4mg'$  or placebo, which she will start today. She denies any new problems or medications. She has chronic knee pain and is waiting on knee replacement surgery and has been working out a lot trying to decrease her weight before surgery. She denies any muscular pain or weakness. She was instructed on dosing, what side effects to watch out for, particularly rash and muscle aches and when to call for problems. She will be returning in July for the 1 month followup.

## 2015-12-07 NOTE — Telephone Encounter (Signed)
Patient requested and will pick up 

## 2015-12-08 ENCOUNTER — Encounter: Payer: Self-pay | Admitting: *Deleted

## 2015-12-08 ENCOUNTER — Other Ambulatory Visit: Payer: Self-pay

## 2015-12-08 DIAGNOSIS — M1712 Unilateral primary osteoarthritis, left knee: Secondary | ICD-10-CM | POA: Diagnosis not present

## 2015-12-08 MED ORDER — LEVOTHYROXINE SODIUM 150 MCG PO TABS: ORAL_TABLET | ORAL | Status: DC

## 2015-12-09 MED ORDER — PITAVASTATIN CALCIUM 4 MG PO TABS: 4.0000 mg | ORAL_TABLET | Freq: Every day | ORAL | Status: DC

## 2015-12-09 NOTE — Addendum Note (Signed)
Addended by: Bobbie Stack on: 12/09/2015 07:45 AM   Modules accepted: Orders

## 2015-12-20 ENCOUNTER — Ambulatory Visit (INDEPENDENT_AMBULATORY_CARE_PROVIDER_SITE_OTHER): Payer: Commercial Managed Care - HMO | Admitting: Nurse Practitioner

## 2015-12-20 ENCOUNTER — Encounter: Payer: Self-pay | Admitting: Nurse Practitioner

## 2015-12-20 VITALS — BP 118/76 | HR 70 | Temp 97.8°F | Resp 18 | Ht 66.0 in | Wt 262.6 lb

## 2015-12-20 DIAGNOSIS — Z0000 Encounter for general adult medical examination without abnormal findings: Secondary | ICD-10-CM | POA: Diagnosis not present

## 2015-12-20 NOTE — Progress Notes (Signed)
Patient ID: Kayla Price, female   DOB: 03/07/1956, 60 y.o.   MRN: [id number]    PCP: Sharon Seller, NP  Advanced Directive information Does patient have an advance directive?: Yes, Type of Advance Directive: Healthcare Power of [place];Living will  Allergies  Allergen Reactions  . Lisinopril Swelling    Swelling of tongue and mouth    Chief Complaint  Patient presents with  . Medical Management of Chronic Issues    Annual physical.      HPI: Patient is a 60 y.o. female seen in the office today for annual exam. Pt is doing well in the last year. No major illness She has really increased her activity due to being overweight.  Goal is BMI to 40 or below for knee surgery Pts BMI was 45 at the time, now at 42.   Pt is now in a clinical trial through the HIV clinic for Statin therapy due to HIV medication causes heart disease.   Screenings: Colon Cancer- colonoscopy due in 2018 Breast Cancer- mammogram done 09/09/2015 Cervical Cancer- PAP done by ID due in August   Depression screening Depression screen [facility] 2/9 12/20/2015 11/23/2015 06/08/2015 12/09/2014 12/01/2014  Decreased Interest 0 0 0 0 0  Down, Depressed, Hopeless 0 0 0 0 0  PHQ - 2 Score 0 0 0 0 0   Falls Fall Risk  12/20/2015 11/23/2015 06/10/2015 06/08/2015 12/09/2014  Falls in the past year? No No No No No  Risk for fall due to : - - - - -  Risk for fall due to (comments): - - - - -   MMSE No flowsheet data found. Vaccines Immunization History  Administered Date(s) Administered  . Hepatitis B 07/11/1992, 11/17/2001, 03/02/2002, 04/09/2011  . Influenza Split 04/10/2011, 03/05/2012  . Influenza Whole 03/27/2006, 04/07/2007, 05/06/2008, 05/16/2009, 09/27/2010  . Influenza,inj,Quad PF,36+ Mos 03/09/2013, 04/23/2014  . Influenza-Unspecified 06/08/2015  . Pneumococcal Polysaccharide-23 03/27/2006, 02/27/2012    Smoking status:.never Alcohol use: never  Dentist: 3 times yearly Ophthalmologist: as  needed  Exercise regimen: pt now walks 1 mile, 5-6 miles on bike at the [facility] and she is in the pool for 30 mins 5 days a week  Diet: cut out a lot of starches and sugars and increase fruit and vegetable, limit red meat and eats more fish Not eating pork, more chicken  Pt was up to 279 in May, now down to 262 lbs Never been diagnosed with diabetes, just prediabetic and gestational diabetes  Review of Systems:  Review of Systems  Constitutional: Negative for fever, chills, activity change, appetite change, fatigue and unexpected weight change.  HENT: Negative for congestion, ear discharge, ear pain, hearing loss, postnasal drip, rhinorrhea, sinus pressure and sore throat.   Eyes: Negative for photophobia, pain, discharge, redness, itching and visual disturbance.  Respiratory: Negative for cough and shortness of breath.   Cardiovascular: Negative for chest pain, palpitations and leg swelling.  Gastrointestinal: Negative for abdominal pain, diarrhea and constipation.  Genitourinary: Positive for frequency (chronic frequency, on HTCZ). Negative for dysuria and difficulty urinating.  Musculoskeletal: Positive for arthralgias (L knee pain, constant). Negative for myalgias.       Hx of fall in 2009, fx right hip which will cause her some discomfort if the weather is cold or rainy  Skin: Negative for color change and wound.  Allergic/Immunologic: Negative for environmental allergies.  Neurological: Negative for dizziness and weakness.  Psychiatric/Behavioral: Negative for behavioral problems, confusion and agitation. The patient is not nervous/anxious.  Past Medical History  Diagnosis Date  . HIV DISEASE 03/27/2006  . 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 Unspecified vitamin D deficiency 08/06/2007  . HYPERLIPIDEMIA, MIXED 12/15/2007  . HYPERTENSION 03/27/2006  . PVD 04/07/2007  . Alopecia areata 11/28/2009  . MENORRHAGIA, POSTMENOPAUSAL 02/03/2009  . TRIGGER FINGER 05/06/2008  . HIP  FRACTURE, RIGHT 05/06/2008  . BREAST CANCER, HX OF 03/27/2006  . OSTEOARTHROSIS, LOCAL, SCND, UNSPC SITE 04/07/2007  . Fasting hyperglycemia   . Tinea capitis   . HIV (human immunodeficiency virus infection) ([facility])   . Dry eye syndrome   . Bell's palsy   . Diabetes mellitus, controlled ([facility]) 12/01/2014  . Osteoarthritis of left knee 06/08/2015  . CKD (chronic kidney disease) stage 3, GFR 30-59 ml/min 06/08/2015   Past Surgical History  Procedure Laterality Date  . Tubal ligation    . Breast lumpectomy    . Lymph node dissection  2005  . Port-a-cath removal  2006    insertion 2005  . Hysteroscopy  2006  . Thyroid surgery  2009    Ablation   . Colonoscopy     Social History:   reports that she has never smoked. She has never used smokeless tobacco. She reports that she does not drink alcohol or use illicit drugs.  Family History  Problem Relation Age of Onset  . Heart attack Brother     Massive MI in 49s  . Stroke Brother     CAD  . Kidney disease Mother   . Stroke Mother   . Diabetes Mother   . Liver disease Sister   . COPD Sister     had I-131 rx of hyperthyroidism  . Diabetes Sister   . Stroke Sister   . Cancer Paternal Uncle   . Cancer Cousin   . Heart failure Father   . Heart disease Father   . Arthritis Father   . Sarcoidosis Sister     Medications: Patient's Medications  New Prescriptions   No medications on file  Previous Medications   ASCORBIC ACID (VITAMIN C) 250 MG CHEW    Chew 500 mg by mouth daily.   B COMPLEX-C (SUPER B COMPLEX PO)    Take 1 tablet by mouth daily. Reported on 06/08/2015   CHLORPHEN-PSEUDOEPHED-APAP (CORICIDIN D PO)    Take by mouth. Reported on 07/06/2015   CHOLECALCIFEROL (D3 ADULT PO)    Take 2,000 Units by mouth daily.   CHROMIUM PICOLINATE 800 MCG TABS    Take 800 mcg by mouth daily. Reported on 06/08/2015   CO-ENZYME Q-10 30 MG CAPSULE    Take 30 mg by mouth 3 (three) times daily.   EDURANT 25 MG TABS TABLET    TAKE 1 TABLET BY  MOUTH EVERY DAY WITH BREAKFAST   HYDROCODONE-ACETAMINOPHEN (NORCO) 5-325 MG TABLET    Take 1 tablet by mouth every 6 (six) hours as needed for moderate pain.   LEVOTHYROXINE (SYNTHROID, LEVOTHROID) 150 MCG TABLET    TAKE 1 TABLET (150 MCG TOTAL) BY MOUTH DAILY BEFORE BREAKFAST.   LORATADINE (CLARITIN) 10 MG TABLET    Take 10 mg by mouth daily as needed for itching. Reported on 06/08/2015   OLMESARTAN-HYDROCHLOROTHIAZIDE (BENICAR HCT) 40-25 MG TABLET    Take 1 tablet by mouth daily.   PITAVASTATIN CALCIUM 4 MG TABS    Take 1 tablet (4 mg total) by mouth daily. This may be placebo, study provided. Do not dispense.   POTASSIUM GLUCONATE PO    Take 1 tablet by mouth daily.  595 mg   PREZCOBIX 800-150 MG TABLET    TAKE 1 TABLET BY MOUTH DAILY. SWALLOW WHOLE. DO NOT CRUSH, BREAK OR CHEW TABLETS. TAKE WITH FOOD.   PROBIOTIC PRODUCT (PROBIOTIC COMPLEX ACIDOPHILUS) CAPS    Take 1 capsule by mouth daily.   TIVICAY 50 MG TABLET    TAKE 1 TABLET BY MOUTH EVERY DAY   VITAMIN E PO    Take 1 tablet by mouth daily. 400 units  Modified Medications   No medications on file  Discontinued Medications   BIOTIN 5000 MCG CAPS    Take 5,000 mcg by mouth daily. Reported on 06/08/2015   EVENING PRIMROSE OIL 1000 MG CAPS    Take 1,000 capsules by mouth daily. Reported on 07/06/2015   LEVOTHYROXINE (SYNTHROID, LEVOTHROID) 150 MCG TABLET    Take 1 tablet (150 mcg total) by mouth daily.   OMEGA-3 FATTY ACIDS (FISH OIL) 1000 MG CAPS    Take 1,000 mg by mouth daily.   TDAP (BOOSTRIX) 5-2.5-18.5 LF-MCG/0.5 INJECTION    Inject 0.5 mLs into the muscle once.   TRAMADOL (ULTRAM) 50 MG TABLET    Take 1 tablet (50 mg total) by mouth every 8 (eight) hours as needed.     Physical Exam:  Filed Vitals:   12/20/15 1359  BP: 118/76  Pulse: 70  Temp: 97.8 F (36.6 C)  TempSrc: Oral  Resp: 18  Height: 5\' 6"  (1.676 m)  Weight: 262 lb 9.6 oz (119.115 kg)  SpO2: 98%   Body mass index is 42.41 kg/(m^2).  Physical Exam   Constitutional: She is oriented to person, place, and time. She appears well-developed and well-nourished.  HENT:  Head: Normocephalic and atraumatic.  Right Ear: External ear normal.  Left Ear: External ear normal.  Nose: Nose normal.  Mouth/Throat: Oropharynx is clear and moist. No oropharyngeal exudate.  Eyes: Pupils are equal, round, and reactive to light. No scleral icterus.  Neck: Normal range of motion. Neck supple. Carotid bruit is not present. No tracheal deviation present. No thyromegaly present.  Cardiovascular: Normal rate, regular rhythm and intact distal pulses.  Exam reveals no gallop and no friction rub.   Murmur (1/6 SEM) heard. Pulmonary/Chest: Effort normal and breath sounds normal. No stridor. No respiratory distress. She has no wheezes. She has no rales.  Abdominal: Soft. Bowel sounds are normal. She exhibits no distension and no mass. There is no hepatomegaly. There is no tenderness. There is no rebound and no guarding.  Musculoskeletal: She exhibits tenderness (bilateral knees). She exhibits no edema.  Left knee with reduced ROM and TTP with antalgic gait  Lymphadenopathy:    She has no cervical adenopathy.  Neurological: She is alert and oriented to person, place, and time.  Skin: Skin is warm and dry. No rash noted.  Psychiatric: She has a normal mood and affect. Her behavior is normal. Judgment and thought content normal.   Labs reviewed: Basic Metabolic Panel:  Recent Labs  53/66/44 0959 07/06/15 0932 11/10/15 1125 11/29/15 1040  NA 141  --  141 140  K 3.9  --  4.8 3.5  CL 101  --  102 101  CO2 29  --  28 25  GLUCOSE 94  --  82 93  BUN 16  --  17 24  CREATININE 1.44*  --  1.38* 1.48*  CALCIUM 10.2  --  10.6* 10.2  TSH  --  1.58  --   --    Liver Function Tests:  Recent Labs  05/24/15  8469 11/10/15 1125 11/29/15 1040  AST 15 20 24   ALT 14 14 21   ALKPHOS 73 51 56  BILITOT 0.5 0.8 0.4  PROT 7.5 7.4 7.7  ALBUMIN 3.8 3.9 4.2   No results  for input(s): LIPASE, AMYLASE in the last 8760 hours. No results for input(s): AMMONIA in the last 8760 hours. CBC:  Recent Labs  05/24/15 0959 11/10/15 1125  WBC 5.3 4.7  NEUTROABS 2.5 2115  HGB 15.0 15.3  HCT 42.5 45.9*  MCV 92.0 94.6  PLT 209 200   Lipid Panel:  Recent Labs  05/24/15 0959 11/29/15 1040  CHOL 182 132  HDL 55 57  LDLCALC 107 58  TRIG 102 87  CHOLHDL 3.3 2.3   TSH:  Recent Labs  07/06/15 0932  TSH 1.58   A1C: Lab Results  Component Value Date   HGBA1C 5.7* 10/15/2014     Assessment/Plan 1. Preventative health care -Pt is doing well and has made lifestyle changes in the last few months. Pt has increased activity and eating a better diet. The patient was counseled regarding the appropriate use of alcohol, regular self-examination of the breasts on a monthly basis, prevention of dental and periodontal disease, diet, regular sustained exercise for at least 30 minutes 5 times per week, routine screening interval for mammogram as recommended by the American Cancer Society and ACOG, importance of regular PAP smears, and recommended schedule for GI hemoccult testing, colonoscopy, cholesterol, thyroid and diabetes screening.  To follow up in 3 months or sooner if needed    Theador Jezewski K. Biagio Borg  [facility] [phone] 8 am - 5 pm) [phone] (after hours) ,

## 2015-12-20 NOTE — Patient Instructions (Signed)
Keep up the good work with diet and exercise

## 2016-01-02 ENCOUNTER — Other Ambulatory Visit: Payer: Self-pay | Admitting: Orthopedic Surgery

## 2016-01-02 ENCOUNTER — Other Ambulatory Visit: Payer: Self-pay | Admitting: Endocrinology

## 2016-01-05 ENCOUNTER — Encounter (INDEPENDENT_AMBULATORY_CARE_PROVIDER_SITE_OTHER): Payer: Commercial Managed Care - HMO | Admitting: *Deleted

## 2016-01-05 VITALS — BP 118/80 | HR 66 | Temp 98.1°F | Resp 16 | Wt 259.5 lb

## 2016-01-05 DIAGNOSIS — Z006 Encounter for examination for normal comparison and control in clinical research program: Secondary | ICD-10-CM

## 2016-01-05 LAB — COMPREHENSIVE METABOLIC PANEL
ALBUMIN: 4 g/dL (ref 3.6–5.1)
ALT: 14 U/L (ref 6–29)
AST: 16 U/L (ref 10–35)
Alkaline Phosphatase: 65 U/L (ref 33–130)
BUN: 21 mg/dL (ref 7–25)
CHLORIDE: 102 mmol/L (ref 98–110)
CO2: 29 mmol/L (ref 20–31)
CREATININE: 1.51 mg/dL — AB (ref 0.50–1.05)
Calcium: 10.4 mg/dL (ref 8.6–10.4)
GLUCOSE: 93 mg/dL (ref 65–99)
Potassium: 4.3 mmol/L (ref 3.5–5.3)
SODIUM: 140 mmol/L (ref 135–146)
Total Bilirubin: 0.6 mg/dL (ref 0.2–1.2)
Total Protein: 7.4 g/dL (ref 6.1–8.1)

## 2016-01-05 NOTE — Progress Notes (Signed)
Kayla Price is here for her month 1 visit [phone]: A Randomized Trial to Prevent Vascular Events in HIV (The REPRIEVE Study). No new complaints/concerns verbalized. States excellent adherence with study medication. Continues to exercise (walking, swimming) 2 hours everyday but Sundays at the [facility]. States that she has lost 20lbs since starting exercise and feels great. She is schedule for (L) TKR on 02/06/16 by Dr. Mayer Camel. Next visit is scheduled for 10/12 @ 8:30am.

## 2016-01-10 ENCOUNTER — Other Ambulatory Visit: Payer: Self-pay | Admitting: *Deleted

## 2016-01-10 DIAGNOSIS — M170 Bilateral primary osteoarthritis of knee: Secondary | ICD-10-CM

## 2016-01-10 MED ORDER — HYDROCODONE-ACETAMINOPHEN 5-325 MG PO TABS: 1.0000 | ORAL_TABLET | Freq: Four times a day (QID) | ORAL | Status: DC | PRN

## 2016-01-10 NOTE — Telephone Encounter (Signed)
Patient requested and will pick up 

## 2016-01-26 ENCOUNTER — Encounter (HOSPITAL_COMMUNITY): Payer: Self-pay

## 2016-01-27 ENCOUNTER — Encounter (HOSPITAL_COMMUNITY): Payer: Self-pay

## 2016-01-27 ENCOUNTER — Encounter (HOSPITAL_COMMUNITY)
Admission: RE | Admit: 2016-01-27 | Discharge: 2016-01-27 | Disposition: A | Discharge status: Home / Self Care | Payer: Commercial Managed Care - HMO | Source: Ambulatory Visit | Attending: Orthopedic Surgery | Admitting: Orthopedic Surgery

## 2016-01-27 ENCOUNTER — Ambulatory Visit (HOSPITAL_COMMUNITY)
Admission: RE | Admit: 2016-01-27 | Discharge: 2016-01-27 | Disposition: A | Discharge status: Home / Self Care | Payer: Commercial Managed Care - HMO | Source: Ambulatory Visit | Attending: Orthopedic Surgery | Admitting: Orthopedic Surgery

## 2016-01-27 DIAGNOSIS — Z01810 Encounter for preprocedural cardiovascular examination: Secondary | ICD-10-CM | POA: Insufficient documentation

## 2016-01-27 DIAGNOSIS — Z96651 Presence of right artificial knee joint: Secondary | ICD-10-CM | POA: Diagnosis not present

## 2016-01-27 DIAGNOSIS — Z01818 Encounter for other preprocedural examination: Secondary | ICD-10-CM | POA: Insufficient documentation

## 2016-01-27 DIAGNOSIS — Z01812 Encounter for preprocedural laboratory examination: Secondary | ICD-10-CM | POA: Diagnosis not present

## 2016-01-27 HISTORY — DX: Malignant (primary) neoplasm, unspecified: C80.1

## 2016-01-27 LAB — BASIC METABOLIC PANEL
ANION GAP: 6 (ref 5–15)
BUN: 18 mg/dL (ref 6–20)
CALCIUM: 10.2 mg/dL (ref 8.9–10.3)
CO2: 28 mmol/L (ref 22–32)
Chloride: 105 mmol/L (ref 101–111)
Creatinine, Ser: 1.33 mg/dL — ABNORMAL HIGH (ref 0.44–1.00)
GFR, EST AFRICAN AMERICAN: 49 mL/min — AB (ref >60)
GFR, EST NON AFRICAN AMERICAN: 42 mL/min — AB (ref >60)
GLUCOSE: 93 mg/dL (ref 65–99)
POTASSIUM: 4.1 mmol/L (ref 3.5–5.1)
Sodium: 139 mmol/L (ref 135–145)

## 2016-01-27 LAB — URINALYSIS, ROUTINE W REFLEX MICROSCOPIC
BILIRUBIN URINE: NEGATIVE
GLUCOSE, UA: NEGATIVE mg/dL
HGB URINE DIPSTICK: NEGATIVE
KETONES UR: NEGATIVE mg/dL
Leukocytes, UA: NEGATIVE
Nitrite: NEGATIVE
PROTEIN: NEGATIVE mg/dL
Specific Gravity, Urine: 1.028 (ref 1.005–1.030)
pH: 5.5 (ref 5.0–8.0)

## 2016-01-27 LAB — CBC WITH DIFFERENTIAL/PLATELET
BASOS ABS: 0 10*3/uL (ref 0.0–0.1)
BASOS PCT: 0 %
Eosinophils Absolute: 0.1 10*3/uL (ref 0.0–0.7)
Eosinophils Relative: 2 %
HEMATOCRIT: 45.6 % (ref 36.0–46.0)
HEMOGLOBIN: 15 g/dL (ref 12.0–15.0)
LYMPHS PCT: 47 %
Lymphs Abs: 2.1 10*3/uL (ref 0.7–4.0)
MCH: 32.3 pg (ref 26.0–34.0)
MCHC: 32.9 g/dL (ref 30.0–36.0)
MCV: 98.1 fL (ref 78.0–100.0)
MONO ABS: 0.3 10*3/uL (ref 0.1–1.0)
Monocytes Relative: 6 %
NEUTROS ABS: 2.1 10*3/uL (ref 1.7–7.7)
NEUTROS PCT: 45 %
Platelets: 190 10*3/uL (ref 150–400)
RBC: 4.65 MIL/uL (ref 3.87–5.11)
RDW: 14 % (ref 11.5–15.5)
WBC: 4.6 10*3/uL (ref 4.0–10.5)

## 2016-01-27 LAB — TYPE AND SCREEN
ABO/RH(D): O POS
ANTIBODY SCREEN: NEGATIVE

## 2016-01-27 LAB — PROTIME-INR
INR: 1.02
Prothrombin Time: 13.4 seconds (ref 11.4–15.2)

## 2016-01-27 LAB — ABO/RH: ABO/RH(D): O POS

## 2016-01-27 LAB — SURGICAL PCR SCREEN
MRSA, PCR: NEGATIVE
Staphylococcus aureus: NEGATIVE

## 2016-01-27 LAB — APTT: APTT: 31 s (ref 24–36)

## 2016-01-27 NOTE — Pre-Procedure Instructions (Signed)
    Suesan Mohrmann Evola  01/27/2016      [facility] - [place], [place] [address] Phone: [phone] Fax: [phone]  [id number] [place] [id number]: [phone]: [phone] [facility] - [address] [place]Phone: [phone]: [phone]   Your procedure is scheduled on 02-06-2016  Monday   [facility]   Call this number if you have problems the morning of surgery:  [phone]   Remember:  Do not eat food or drink liquids after midnight.   Take these medicines the morning of surgery with A SIP OF WATER Edurant,Pain medication if needed,levothyroxine(Synthroid),Prezcobix,Tivicay,             STOP ASPIRIN,ANTIINFLAMATORIES (IBUPROFEN,ALEVE,MOTRIN,ADVIL,GOODY'S POWDERS),HERBAL SUPPLEMENTS,FISH OIL,AND VITAMINS 5-7 DAYS PRIOR TO SURGERY   Do not wear jewelry, .  Do not wear lotions, powders, or perfumes.  You may NOT wear deoderant.  Do not shave 48 hours prior to surgery.  Men may shave face and neck.   Do not bring valuables to the hospital.  [facility] or [facility].  Contacts, dentures or bridgework may not be worn into surgery.  Leave your suitcase in the car.  After surgery it may be brought to your room.  For patients admitted to the hospital, discharge time will be determined by your treatment team.  Patients discharged the day of surgery will not be allowed to drive home.    Special instructions:  See Attached Sheet for instructions on CHG showers  Please read over the following fact sheets that you were given. Coughing and Deep Breathing, MRSA Information and Surgical Site Infection  Prevention

## 2016-02-03 NOTE — H&P (Signed)
TOTAL KNEE ADMISSION H&P  Patient is being admitted for left total knee arthroplasty.  Subjective:  Chief Complaint:left knee pain.  HPI: Kayla Price, 60 y.o. female, has a history of pain and functional disability in the left knee due to arthritis and has failed non-surgical conservative treatments for greater than 12 weeks to includeNSAID's and/or analgesics, corticosteriod injections, viscosupplementation injections, flexibility and strengthening excercises, use of assistive devices, weight reduction as appropriate and activity modification.  Onset of symptoms was gradual, starting 3 years ago with gradually worsening course since that time. The patient noted no past surgery on the left knee(s).  Patient currently rates pain in the left knee(s) at 10 out of 10 with activity. Patient has night pain, worsening of pain with activity and weight bearing, pain that interferes with activities of daily living, pain with passive range of motion, crepitus and joint swelling.  Patient has evidence of periarticular osteophytes, joint subluxation and joint space narrowing by imaging studies.   There is no active infection.  Patient Active Problem List   Diagnosis Date Noted  . Osteoarthritis of left knee 06/08/2015  . CKD (chronic kidney disease) stage 3, GFR 30-59 ml/min 06/08/2015  . Vitamin D deficiency 09/23/2014  . Acute renal insufficiency 04/23/2014  . 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 Bell's palsy 12/04/2012  . Pharyngitis 01/14/2012  . Contact dermatitis 08/29/2011  . Dry eyes 08/20/2011  . Dry mouth 08/20/2011  . Neuropathy ([place]) 04/09/2011  . Insomnia 04/09/2011  . Obesities, morbid ([place]) 09/27/2010  . Endometrial polyp 12/22/2009  . ALOPECIA AREATA 11/28/2009  . ACUTE NASOPHARYNGITIS 07/18/2009  . MENORRHAGIA, POSTMENOPAUSAL 02/03/2009  . ABNORMAL GLANDULAR PAPANICOLAOU SMEAR OF CERVIX 11/04/2008  . BROKEN TOOTH, WITH COMPLICATION 93/81/8299  . ALOPECIA 05/06/2008  .  TRIGGER FINGER 05/06/2008  . HIP FRACTURE, RIGHT 05/06/2008  . ARTHROSCOPY, LEFT KNEE, HX OF 02/03/2008  . HYPERLIPIDEMIA, MIXED 12/15/2007  . HAND PAIN, RIGHT 12/15/2007  . Other abnormal glucose 12/15/2007  . UNSPECIFIED VITAMIN D DEFICIENCY 08/06/2007  . TINEA CAPITIS 08/04/2007  . CNTC DERMATITIS&OTH ECZEMA DUE OTH CHEM PRODUCTS 08/04/2007  . PVD 04/07/2007  . Secondary localized osteoarthrosis 04/07/2007  . Human immunodeficiency virus (HIV) disease ([place]) 03/27/2006  . Essential hypertension 03/27/2006  . BREAST CANCER, HX OF 03/27/2006   Past Medical History:  Diagnosis Date  . Alopecia areata 11/28/2009  . Bell's palsy   . BREAST CANCER, HX OF 03/27/2006  . Cancer [facility]) 2005   Breast cancer   chemotherapy and radiation  . CKD (chronic kidney disease) stage 3, GFR 30-59 ml/min 06/08/2015  . Dry eye syndrome   . Fasting hyperglycemia   . HIP FRACTURE, RIGHT 05/06/2008  . HIV (human immunodeficiency virus infection) ([place])   . HIV DISEASE 03/27/2006  . HYPERLIPIDEMIA, MIXED 12/15/2007  . HYPERTENSION 03/27/2006  . Hypertension   . 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 MENORRHAGIA, POSTMENOPAUSAL 02/03/2009  . Osteoarthritis of left knee 06/08/2015  . OSTEOARTHROSIS, LOCAL, SCND, UNSPC SITE 04/07/2007  . PVD 04/07/2007  . Tinea capitis   . TRIGGER FINGER 05/06/2008  . Unspecified vitamin D deficiency 08/06/2007    Past Surgical History:  Procedure Laterality Date  . BREAST LUMPECTOMY    . COLONOSCOPY    . HYSTEROSCOPY  2006  . LYMPH NODE DISSECTION  2005  . placement   of port-a-cath    . PORT-A-CATH REMOVAL  2006   insertion 2005  . THYROID SURGERY  2009   Ablation   . TUBAL LIGATION  No prescriptions prior to admission.   Allergies  Allergen Reactions  . Lisinopril Swelling    Swelling of tongue and mouth    Social History  Substance Use Topics  . Smoking status: Never Smoker  . Smokeless tobacco: Never Used  . Alcohol use No    Family History   Problem Relation Age of Onset  . Heart attack Brother     Massive MI in 94s  . Stroke Brother     CAD  . Kidney disease Mother   . Stroke Mother   . Diabetes Mother   . Liver disease Sister   . COPD Sister     had I-131 rx of hyperthyroidism  . Diabetes Sister   . Stroke Sister   . Cancer Paternal Uncle   . Cancer Cousin   . Heart failure Father   . Heart disease Father   . Arthritis Father   . Sarcoidosis Sister      Review of Systems  Eyes: Negative.        Glasses  Respiratory: Negative.   Cardiovascular: Negative.        HTN  Gastrointestinal: Negative.   Genitourinary: Negative.   Musculoskeletal: Positive for joint pain.  Neurological: Negative.   Endo/Heme/Allergies: Negative.        HIV  Psychiatric/Behavioral: Negative.     Objective:  Physical Exam  Constitutional: She is oriented to person, place, and time. She appears well-developed and well-nourished.  HENT:  Head: Normocephalic and atraumatic.  Eyes: Pupils are equal, round, and reactive to light.  Neck: Normal range of motion. Neck supple.  Cardiovascular: Intact distal pulses.   Respiratory: Effort normal.  Musculoskeletal: She exhibits tenderness.  Range of motion of the left knee is 5/115, right knee 0/120 limited by adipose tissue.  The left knee has 1+ ligamentous instability to varus and valgus testing  Neurological: She is alert and oriented to person, place, and time.  Skin: Skin is warm and dry.  Psychiatric: She has a normal mood and affect. Her behavior is normal. Judgment and thought content normal.    Vital signs in last 24 hours:    Labs:   Estimated body mass index is 40.57 kg/m as calculated from the following:   Height as of 01/27/16: '5\' 7"'$  (1.702 m).   Weight as of 01/27/16: 117.5 kg (259 lb).   Imaging Review Plain radiographs demonstrate  bone-on-bone arthritis and 1 cm lateral subluxation of tibia beneath the femur.  Assessment/Plan:  End stage arthritis, left  knee   The patient history, physical examination, clinical judgment of the provider and imaging studies are consistent with end stage degenerative joint disease of the left knee(s) and total knee arthroplasty is deemed medically necessary. The treatment options including medical management, injection therapy arthroscopy and arthroplasty were discussed at length. The risks and benefits of total knee arthroplasty were presented and reviewed. The risks due to aseptic loosening, infection, stiffness, patella tracking problems, thromboembolic complications and other imponderables were discussed. The patient acknowledged the explanation, agreed to proceed with the plan and consent was signed. Patient is being admitted for inpatient treatment for surgery, pain control, PT, OT, prophylactic antibiotics, VTE prophylaxis, progressive ambulation and ADL's and discharge planning. The patient is planning to be discharged home with home health services

## 2016-02-05 DIAGNOSIS — M1712 Unilateral primary osteoarthritis, left knee: Secondary | ICD-10-CM | POA: Diagnosis present

## 2016-02-05 MED ORDER — BUPIVACAINE LIPOSOME 1.3 % IJ SUSP
20.0000 mL | INTRAMUSCULAR | Status: DC
  Filled 2016-02-05: qty 20

## 2016-02-05 MED ORDER — CEFAZOLIN SODIUM-DEXTROSE 2-4 GM/100ML-% IV SOLN
2.0000 g | INTRAVENOUS | Status: AC
  Administered 2016-02-06: 2 g via INTRAVENOUS
  Filled 2016-02-05: qty 100

## 2016-02-05 MED ORDER — TRANEXAMIC ACID 1000 MG/10ML IV SOLN
1000.0000 mg | INTRAVENOUS | Status: AC
  Administered 2016-02-06: 1000 mg via INTRAVENOUS
  Filled 2016-02-05: qty 10

## 2016-02-05 MED ORDER — TRANEXAMIC ACID 1000 MG/10ML IV SOLN
2000.0000 mg | Freq: Once | INTRAVENOUS | Status: DC
  Filled 2016-02-05: qty 20

## 2016-02-06 ENCOUNTER — Encounter (HOSPITAL_COMMUNITY): Payer: Self-pay | Admitting: Surgery

## 2016-02-06 ENCOUNTER — Encounter (HOSPITAL_COMMUNITY)
Admission: RE | Disposition: A | Discharge status: Skilled Nursing Facility | Payer: Self-pay | Source: Ambulatory Visit | Attending: Orthopedic Surgery

## 2016-02-06 ENCOUNTER — Inpatient Hospital Stay (HOSPITAL_COMMUNITY)
Admission: RE | Admit: 2016-02-06 | Discharge: 2016-02-08 | DRG: 469 | Disposition: A | Discharge status: Skilled Nursing Facility | Payer: Commercial Managed Care - HMO | Source: Ambulatory Visit | Attending: Orthopedic Surgery | Admitting: Orthopedic Surgery

## 2016-02-06 ENCOUNTER — Inpatient Hospital Stay (HOSPITAL_COMMUNITY): Payer: Commercial Managed Care - HMO | Admitting: Certified Registered"

## 2016-02-06 DIAGNOSIS — D62 Acute posthemorrhagic anemia: Secondary | ICD-10-CM | POA: Diagnosis not present

## 2016-02-06 DIAGNOSIS — E782 Mixed hyperlipidemia: Secondary | ICD-10-CM | POA: Diagnosis present

## 2016-02-06 DIAGNOSIS — N183 Chronic kidney disease, stage 3 (moderate): Secondary | ICD-10-CM | POA: Diagnosis not present

## 2016-02-06 DIAGNOSIS — G8918 Other acute postprocedural pain: Secondary | ICD-10-CM | POA: Diagnosis not present

## 2016-02-06 DIAGNOSIS — R262 Difficulty in walking, not elsewhere classified: Secondary | ICD-10-CM | POA: Diagnosis not present

## 2016-02-06 DIAGNOSIS — B20 Human immunodeficiency virus [HIV] disease: Secondary | ICD-10-CM | POA: Diagnosis present

## 2016-02-06 DIAGNOSIS — Z853 Personal history of malignant neoplasm of breast: Secondary | ICD-10-CM

## 2016-02-06 DIAGNOSIS — Z471 Aftercare following joint replacement surgery: Secondary | ICD-10-CM | POA: Diagnosis not present

## 2016-02-06 DIAGNOSIS — Z888 Allergy status to other drugs, medicaments and biological substances status: Secondary | ICD-10-CM

## 2016-02-06 DIAGNOSIS — Z6841 Body Mass Index (BMI) 40.0 and over, adult: Secondary | ICD-10-CM

## 2016-02-06 DIAGNOSIS — Z9221 Personal history of antineoplastic chemotherapy: Secondary | ICD-10-CM | POA: Diagnosis not present

## 2016-02-06 DIAGNOSIS — M1712 Unilateral primary osteoarthritis, left knee: Secondary | ICD-10-CM | POA: Diagnosis not present

## 2016-02-06 DIAGNOSIS — E890 Postprocedural hypothyroidism: Secondary | ICD-10-CM | POA: Diagnosis present

## 2016-02-06 DIAGNOSIS — I10 Essential (primary) hypertension: Secondary | ICD-10-CM | POA: Diagnosis not present

## 2016-02-06 DIAGNOSIS — Z923 Personal history of irradiation: Secondary | ICD-10-CM

## 2016-02-06 DIAGNOSIS — I129 Hypertensive chronic kidney disease with stage 1 through stage 4 chronic kidney disease, or unspecified chronic kidney disease: Secondary | ICD-10-CM | POA: Diagnosis present

## 2016-02-06 DIAGNOSIS — I739 Peripheral vascular disease, unspecified: Secondary | ICD-10-CM | POA: Diagnosis not present

## 2016-02-06 DIAGNOSIS — M6281 Muscle weakness (generalized): Secondary | ICD-10-CM | POA: Diagnosis not present

## 2016-02-06 DIAGNOSIS — M25562 Pain in left knee: Secondary | ICD-10-CM | POA: Diagnosis present

## 2016-02-06 DIAGNOSIS — Z8249 Family history of ischemic heart disease and other diseases of the circulatory system: Secondary | ICD-10-CM | POA: Diagnosis not present

## 2016-02-06 DIAGNOSIS — M179 Osteoarthritis of knee, unspecified: Secondary | ICD-10-CM | POA: Diagnosis not present

## 2016-02-06 DIAGNOSIS — Z96652 Presence of left artificial knee joint: Secondary | ICD-10-CM | POA: Diagnosis not present

## 2016-02-06 HISTORY — PX: TOTAL KNEE ARTHROPLASTY: SHX125

## 2016-02-06 SURGERY — ARTHROPLASTY, KNEE, TOTAL
Anesthesia: Spinal | Laterality: Left

## 2016-02-06 MED ORDER — ASPIRIN EC 325 MG PO TBEC
325.0000 mg | DELAYED_RELEASE_TABLET | Freq: Every day | ORAL | Status: DC
  Administered 2016-02-07 – 2016-02-08 (×2): 325 mg via ORAL
  Filled 2016-02-06 (×2): qty 1

## 2016-02-06 MED ORDER — ONDANSETRON HCL 4 MG PO TABS: 4.0000 mg | ORAL_TABLET | Freq: Four times a day (QID) | ORAL | Status: DC | PRN

## 2016-02-06 MED ORDER — DARUNAVIR-COBICISTAT 800-150 MG PO TABS
1.0000 | ORAL_TABLET | Freq: Every day | ORAL | Status: DC
  Administered 2016-02-06 – 2016-02-08 (×3): 1 via ORAL
  Filled 2016-02-06 (×3): qty 1

## 2016-02-06 MED ORDER — BUPIVACAINE IN DEXTROSE 0.75-8.25 % IT SOLN
INTRATHECAL | Status: DC | PRN
  Administered 2016-02-06: 15 mg via INTRATHECAL

## 2016-02-06 MED ORDER — FLEET ENEMA 7-19 GM/118ML RE ENEM: 1.0000 | ENEMA | Freq: Once | RECTAL | Status: DC | PRN

## 2016-02-06 MED ORDER — OXYCODONE HCL 5 MG PO TABS
ORAL_TABLET | ORAL | Status: AC
  Filled 2016-02-06: qty 2

## 2016-02-06 MED ORDER — SODIUM CHLORIDE 0.9 % IR SOLN
Status: DC | PRN
  Administered 2016-02-06: 1000 mL

## 2016-02-06 MED ORDER — HYDROCHLOROTHIAZIDE 25 MG PO TABS
25.0000 mg | ORAL_TABLET | Freq: Every day | ORAL | Status: DC
  Administered 2016-02-06 – 2016-02-08 (×3): 25 mg via ORAL
  Filled 2016-02-06 (×3): qty 1

## 2016-02-06 MED ORDER — LEVOTHYROXINE SODIUM 75 MCG PO TABS
150.0000 ug | ORAL_TABLET | Freq: Every day | ORAL | Status: DC
  Administered 2016-02-07 – 2016-02-08 (×2): 150 ug via ORAL
  Filled 2016-02-06 (×3): qty 2

## 2016-02-06 MED ORDER — ONDANSETRON HCL 4 MG/2ML IJ SOLN: 4.0000 mg | Freq: Four times a day (QID) | INTRAMUSCULAR | Status: DC | PRN

## 2016-02-06 MED ORDER — BISACODYL 5 MG PO TBEC: 5.0000 mg | DELAYED_RELEASE_TABLET | Freq: Every day | ORAL | Status: DC | PRN

## 2016-02-06 MED ORDER — ACETAMINOPHEN 650 MG RE SUPP: 650.0000 mg | Freq: Four times a day (QID) | RECTAL | Status: DC | PRN

## 2016-02-06 MED ORDER — OLMESARTAN MEDOXOMIL-HCTZ 40-25 MG PO TABS: 1.0000 | ORAL_TABLET | Freq: Every day | ORAL | Status: DC

## 2016-02-06 MED ORDER — KCL IN DEXTROSE-NACL 20-5-0.45 MEQ/L-%-% IV SOLN
INTRAVENOUS | Status: DC
  Administered 2016-02-06: 19:00:00 via INTRAVENOUS
  Filled 2016-02-06: qty 1000

## 2016-02-06 MED ORDER — HYDROMORPHONE HCL 1 MG/ML IJ SOLN: 0.5000 mg | INTRAMUSCULAR | Status: DC | PRN

## 2016-02-06 MED ORDER — FENTANYL CITRATE (PF) 250 MCG/5ML IJ SOLN
INTRAMUSCULAR | Status: AC
  Filled 2016-02-06: qty 5

## 2016-02-06 MED ORDER — CEFUROXIME SODIUM 1.5 G IJ SOLR
INTRAMUSCULAR | Status: DC | PRN
  Administered 2016-02-06: 1.5 g

## 2016-02-06 MED ORDER — FENTANYL CITRATE (PF) 250 MCG/5ML IJ SOLN
INTRAMUSCULAR | Status: DC | PRN
  Administered 2016-02-06: 50 ug via INTRAVENOUS

## 2016-02-06 MED ORDER — OXYCODONE-ACETAMINOPHEN 5-325 MG PO TABS: 1.0000 | ORAL_TABLET | ORAL | 0 refills | Status: DC | PRN

## 2016-02-06 MED ORDER — PHENOL 1.4 % MT LIQD: 1.0000 | OROMUCOSAL | Status: DC | PRN

## 2016-02-06 MED ORDER — DEXTROSE-NACL 5-0.45 % IV SOLN: INTRAVENOUS | Status: DC

## 2016-02-06 MED ORDER — METHOCARBAMOL 500 MG PO TABS
500.0000 mg | ORAL_TABLET | Freq: Four times a day (QID) | ORAL | Status: DC | PRN
  Administered 2016-02-06 – 2016-02-08 (×6): 500 mg via ORAL
  Filled 2016-02-06 (×5): qty 1

## 2016-02-06 MED ORDER — PROPOFOL 500 MG/50ML IV EMUL
INTRAVENOUS | Status: DC | PRN
  Administered 2016-02-06: 100 ug/kg/min via INTRAVENOUS
  Administered 2016-02-06: 75 ug/kg/min via INTRAVENOUS

## 2016-02-06 MED ORDER — DIPHENHYDRAMINE HCL 12.5 MG/5ML PO ELIX: 12.5000 mg | ORAL_SOLUTION | ORAL | Status: DC | PRN

## 2016-02-06 MED ORDER — ALUM & MAG HYDROXIDE-SIMETH 200-200-20 MG/5ML PO SUSP: 30.0000 mL | ORAL | Status: DC | PRN

## 2016-02-06 MED ORDER — RILPIVIRINE HCL 25 MG PO TABS
25.0000 mg | ORAL_TABLET | Freq: Every day | ORAL | Status: DC
  Administered 2016-02-07 – 2016-02-08 (×2): 25 mg via ORAL
  Filled 2016-02-06 (×2): qty 1

## 2016-02-06 MED ORDER — ASPIRIN EC 325 MG PO TBEC: 325.0000 mg | DELAYED_RELEASE_TABLET | Freq: Two times a day (BID) | ORAL | 0 refills | Status: DC

## 2016-02-06 MED ORDER — BUPIVACAINE LIPOSOME 1.3 % IJ SUSP
INTRAMUSCULAR | Status: DC | PRN
  Administered 2016-02-06: 20 mL

## 2016-02-06 MED ORDER — METHOCARBAMOL 1000 MG/10ML IJ SOLN
500.0000 mg | Freq: Four times a day (QID) | INTRAMUSCULAR | Status: DC | PRN
Start: 2016-02-06 — End: 2016-02-08
  Filled 2016-02-06: qty 5

## 2016-02-06 MED ORDER — CHLORHEXIDINE GLUCONATE 4 % EX LIQD: 60.0000 mL | Freq: Once | CUTANEOUS | Status: DC

## 2016-02-06 MED ORDER — ACETAMINOPHEN 325 MG PO TABS: 650.0000 mg | ORAL_TABLET | Freq: Four times a day (QID) | ORAL | Status: DC | PRN

## 2016-02-06 MED ORDER — PHENYLEPHRINE HCL 10 MG/ML IJ SOLN
INTRAMUSCULAR | Status: DC | PRN
  Administered 2016-02-06: 40 ug via INTRAVENOUS
  Administered 2016-02-06: 80 ug via INTRAVENOUS
  Administered 2016-02-06: 40 ug via INTRAVENOUS

## 2016-02-06 MED ORDER — OXYCODONE HCL 5 MG PO TABS
5.0000 mg | ORAL_TABLET | ORAL | Status: DC | PRN
  Administered 2016-02-06 – 2016-02-08 (×10): 10 mg via ORAL
  Filled 2016-02-06 (×10): qty 2

## 2016-02-06 MED ORDER — DOLUTEGRAVIR SODIUM 50 MG PO TABS
50.0000 mg | ORAL_TABLET | Freq: Every day | ORAL | Status: DC
  Administered 2016-02-06 – 2016-02-08 (×3): 50 mg via ORAL
  Filled 2016-02-06 (×3): qty 1

## 2016-02-06 MED ORDER — BUPIVACAINE-EPINEPHRINE (PF) 0.25% -1:200000 IJ SOLN
INTRAMUSCULAR | Status: DC | PRN
  Administered 2016-02-06: 60 mL via PERINEURAL

## 2016-02-06 MED ORDER — LIDOCAINE HCL (CARDIAC) 20 MG/ML IV SOLN
INTRAVENOUS | Status: DC | PRN
  Administered 2016-02-06: 30 mg via INTRAVENOUS

## 2016-02-06 MED ORDER — FENTANYL CITRATE (PF) 100 MCG/2ML IJ SOLN
INTRAMUSCULAR | Status: AC
  Administered 2016-02-06: 50 ug
  Filled 2016-02-06: qty 2

## 2016-02-06 MED ORDER — LACTATED RINGERS IV SOLN
INTRAVENOUS | Status: DC | PRN
  Administered 2016-02-06 (×2): via INTRAVENOUS

## 2016-02-06 MED ORDER — BUPIVACAINE-EPINEPHRINE (PF) 0.5% -1:200000 IJ SOLN
INTRAMUSCULAR | Status: DC | PRN
  Administered 2016-02-06: 30 mL via PERINEURAL

## 2016-02-06 MED ORDER — SENNOSIDES-DOCUSATE SODIUM 8.6-50 MG PO TABS: 1.0000 | ORAL_TABLET | Freq: Every evening | ORAL | Status: DC | PRN

## 2016-02-06 MED ORDER — METHOCARBAMOL 500 MG PO TABS
ORAL_TABLET | ORAL | Status: AC
  Filled 2016-02-06: qty 1

## 2016-02-06 MED ORDER — SODIUM CHLORIDE 0.9 % IV SOLN
INTRAVENOUS | Status: DC
  Administered 2016-02-06: 11:00:00 via INTRAVENOUS

## 2016-02-06 MED ORDER — MENTHOL 3 MG MT LOZG: 1.0000 | LOZENGE | OROMUCOSAL | Status: DC | PRN

## 2016-02-06 MED ORDER — MIDAZOLAM HCL 2 MG/2ML IJ SOLN
INTRAMUSCULAR | Status: AC
  Administered 2016-02-06: 1 mg
  Filled 2016-02-06: qty 2

## 2016-02-06 MED ORDER — DOCUSATE SODIUM 100 MG PO CAPS
100.0000 mg | ORAL_CAPSULE | Freq: Two times a day (BID) | ORAL | Status: DC
  Administered 2016-02-06 – 2016-02-08 (×4): 100 mg via ORAL
  Filled 2016-02-06 (×4): qty 1

## 2016-02-06 MED ORDER — TIZANIDINE HCL 2 MG PO TABS: 2.0000 mg | ORAL_TABLET | Freq: Four times a day (QID) | ORAL | 0 refills | Status: DC | PRN

## 2016-02-06 MED ORDER — LORATADINE 10 MG PO TABS: 10.0000 mg | ORAL_TABLET | Freq: Every day | ORAL | Status: DC | PRN

## 2016-02-06 MED ORDER — SODIUM CHLORIDE 0.9 % IJ SOLN
INTRAMUSCULAR | Status: DC | PRN
  Administered 2016-02-06: 50 mL via INTRAVENOUS

## 2016-02-06 MED ORDER — METOCLOPRAMIDE HCL 5 MG PO TABS: 5.0000 mg | ORAL_TABLET | Freq: Three times a day (TID) | ORAL | Status: DC | PRN

## 2016-02-06 MED ORDER — METOCLOPRAMIDE HCL 5 MG/ML IJ SOLN: 5.0000 mg | Freq: Three times a day (TID) | INTRAMUSCULAR | Status: DC | PRN

## 2016-02-06 MED ORDER — CEFUROXIME SODIUM 1.5 G IJ SOLR
INTRAMUSCULAR | Status: AC
  Filled 2016-02-06: qty 1.5

## 2016-02-06 MED ORDER — IRBESARTAN 300 MG PO TABS
300.0000 mg | ORAL_TABLET | Freq: Every day | ORAL | Status: DC
  Administered 2016-02-06 – 2016-02-08 (×3): 300 mg via ORAL
  Filled 2016-02-06 (×3): qty 1

## 2016-02-06 SURGICAL SUPPLY — 54 items
BANDAGE ESMARK 6X9 LF (GAUZE/BANDAGES/DRESSINGS) ×1 IMPLANT
BLADE SAG 18X100X1.27 (BLADE) ×3 IMPLANT
BLADE SAW SGTL 13X75X1.27 (BLADE) ×3 IMPLANT
BLADE SURG ROTATE 9660 (MISCELLANEOUS) IMPLANT
BNDG ELASTIC 6X10 VLCR STRL LF (GAUZE/BANDAGES/DRESSINGS) ×3 IMPLANT
BNDG ESMARK 6X9 LF (GAUZE/BANDAGES/DRESSINGS) ×3
BOWL SMART MIX CTS (DISPOSABLE) ×3 IMPLANT
CAPT KNEE TOTAL 3 ATTUNE ×3 IMPLANT
CEMENT HV SMART SET (Cement) ×6 IMPLANT
COVER SURGICAL LIGHT HANDLE (MISCELLANEOUS) ×3 IMPLANT
CUFF TOURNIQUET SINGLE 34IN LL (TOURNIQUET CUFF) IMPLANT
CUFF TOURNIQUET SINGLE 44IN (TOURNIQUET CUFF) ×3 IMPLANT
DRAPE EXTREMITY T 121X128X90 (DRAPE) ×3 IMPLANT
DRAPE U-SHAPE 47X51 STRL (DRAPES) ×3 IMPLANT
DRSG AQUACEL AG ADV 3.5X10 (GAUZE/BANDAGES/DRESSINGS) ×3 IMPLANT
DURAPREP 26ML APPLICATOR (WOUND CARE) ×6 IMPLANT
ELECT REM PT RETURN 9FT ADLT (ELECTROSURGICAL) ×3
ELECTRODE REM PT RTRN 9FT ADLT (ELECTROSURGICAL) ×1 IMPLANT
EVACUATOR 1/8 PVC DRAIN (DRAIN) IMPLANT
GLOVE BIO SURGEON STRL SZ7.5 (GLOVE) ×3 IMPLANT
GLOVE BIO SURGEON STRL SZ8.5 (GLOVE) ×3 IMPLANT
GLOVE BIOGEL PI IND STRL 8 (GLOVE) ×1 IMPLANT
GLOVE BIOGEL PI IND STRL 9 (GLOVE) ×1 IMPLANT
GLOVE BIOGEL PI INDICATOR 8 (GLOVE) ×2
GLOVE BIOGEL PI INDICATOR 9 (GLOVE) ×2
GOWN STRL REUS W/ TWL LRG LVL3 (GOWN DISPOSABLE) ×1 IMPLANT
GOWN STRL REUS W/ TWL XL LVL3 (GOWN DISPOSABLE) ×2 IMPLANT
GOWN STRL REUS W/TWL LRG LVL3 (GOWN DISPOSABLE) ×3
GOWN STRL REUS W/TWL XL LVL3 (GOWN DISPOSABLE) ×4
GUIDEWIRE 1.6 DRILL TIP (WIRE) ×3 IMPLANT
HANDPIECE INTERPULSE COAX TIP (DISPOSABLE) ×2
HOOD PEEL AWAY FACE SHEILD DIS (HOOD) ×6 IMPLANT
KIT BASIN OR (CUSTOM PROCEDURE TRAY) ×3 IMPLANT
KIT ROOM TURNOVER OR (KITS) ×3 IMPLANT
MANIFOLD NEPTUNE II (INSTRUMENTS) ×3 IMPLANT
NEEDLE 22X1 1/2 (OR ONLY) (NEEDLE) ×6 IMPLANT
NEEDLE SPNL 18GX3.5 QUINCKE PK (NEEDLE) IMPLANT
NS IRRIG 1000ML POUR BTL (IV SOLUTION) ×3 IMPLANT
PACK TOTAL JOINT (CUSTOM PROCEDURE TRAY) ×3 IMPLANT
PAD ARMBOARD 7.5X6 YLW CONV (MISCELLANEOUS) ×6 IMPLANT
SET HNDPC FAN SPRY TIP SCT (DISPOSABLE) ×1 IMPLANT
SUT VIC AB 0 CT1 27 (SUTURE) ×2
SUT VIC AB 0 CT1 27XBRD ANBCTR (SUTURE) ×1 IMPLANT
SUT VIC AB 1 CTX 36 (SUTURE) ×2
SUT VIC AB 1 CTX36XBRD ANBCTR (SUTURE) ×1 IMPLANT
SUT VIC AB 2-0 CT1 27 (SUTURE)
SUT VIC AB 2-0 CT1 TAPERPNT 27 (SUTURE) IMPLANT
SUT VIC AB 3-0 CT1 27 (SUTURE) ×2
SUT VIC AB 3-0 CT1 TAPERPNT 27 (SUTURE) ×1 IMPLANT
SYR CONTROL 10ML LL (SYRINGE) ×6 IMPLANT
TOWEL OR 17X24 6PK STRL BLUE (TOWEL DISPOSABLE) ×3 IMPLANT
TOWEL OR 17X26 10 PK STRL BLUE (TOWEL DISPOSABLE) ×3 IMPLANT
TRAY CATH 16FR W/PLASTIC CATH (SET/KITS/TRAYS/PACK) IMPLANT
WATER STERILE IRR 1000ML POUR (IV SOLUTION) ×3 IMPLANT

## 2016-02-06 NOTE — Progress Notes (Signed)
Orthopedic Tech Progress Note Patient Details:  Kayla Price 1956/04/02 [id number]  Ortho Devices Ortho Device/Splint Location: footsie roll Ortho Device/Splint Interventions: Ordered, Application, Adjustment   Braulio Bosch 02/06/2016, 4:37 PM

## 2016-02-06 NOTE — Interval H&P Note (Signed)
History and Physical Interval Note:  02/06/2016 11:33 AM  Kayla Price  has presented today for surgery, with the diagnosis of LEFT KNEE OSTEOARTHRITIS  The various methods of treatment have been discussed with the patient and family. After consideration of risks, benefits and other options for treatment, the patient has consented to  Procedure(s): TOTAL KNEE ARTHROPLASTY (Left) as a surgical intervention .  The patient's history has been reviewed, patient examined, no change in status, stable for surgery.  I have reviewed the patient's chart and labs.  Questions were answered to the patient's satisfaction.     Kerin Salen

## 2016-02-06 NOTE — Anesthesia Procedure Notes (Signed)
Anesthesia Regional Block:  Adductor canal block  Pre-Anesthetic Checklist: ,, timeout performed, Correct Patient, Correct Site, Correct Laterality, Correct Procedure, Correct Position, site marked, Risks and benefits discussed,  Surgical consent,  Pre-op evaluation,  At surgeon's request and post-op pain management  Laterality: Left and Lower  Prep: chloraprep       Needles:  Injection technique: Single-shot  Needle Type: Echogenic Needle     Needle Length: 9cm 9 cm Needle Gauge: 22 and 22 G    Additional Needles:  Procedures: ultrasound guided (picture in chart) Adductor canal block Narrative:  Start time: 02/06/2016 11:55 AM End time: 02/06/2016 12:03 PM Injection made incrementally with aspirations every 5 mL.  Performed by: Personally  Anesthesiologist: Glennon Mac, Jacquelynne Guedes  Additional Notes: Pt identified in Holding room.  Monitors applied. Working IV access confirmed. Sterile prep, drape L thigh.  #22ga ECHOgenic needle into adductor canal with US guidance.  30cc 0.5% Bupivacaine with 1:200k epi injected incrementally after negative test dose.  Patient asymptomatic, VSS, no heme aspirated, tolerated well.

## 2016-02-06 NOTE — Anesthesia Procedure Notes (Signed)
Spinal  Patient location during procedure: OR Start time: 02/06/2016 12:31 PM End time: 02/06/2016 12:37 PM Staffing Anesthesiologist: Annye Asa Performed: anesthesiologist  Preanesthetic Checklist Completed: patient identified, site marked, surgical consent, pre-op evaluation, timeout performed, IV checked, risks and benefits discussed and monitors and equipment checked Spinal Block Patient position: sitting Prep: Betadine, ChloraPrep and site prepped and draped Patient monitoring: heart rate, cardiac monitor, continuous pulse ox and blood pressure Approach: midline Location: L2-3 Injection technique: single-shot Needle Needle type: Quincke  Needle gauge: 25 G Needle length: 9 cm Additional Notes Pt identified in Operating room.  Monitors applied. Working IV access confirmed. Sterile prep, drape lumbar spine.  1% lido local L 2,3.  #25ga Quincke into clear CSF L 2,3.  '15mg'$  0.75% Bupivacaine with dextrose injected with asp CSF beginning and end of injection.  Patient asymptomatic, VSS, no heme aspirated, tolerated well.  Jenita Seashore, MD

## 2016-02-06 NOTE — Anesthesia Postprocedure Evaluation (Signed)
Anesthesia Post Note  Patient: Kayla Price  Procedure(s) Performed: Procedure(s) (LRB): TOTAL KNEE ARTHROPLASTY (Left)  Patient location during evaluation: PACU Anesthesia Type: Spinal and MAC Level of consciousness: awake and alert Pain management: pain level controlled Vital Signs Assessment: post-procedure vital signs reviewed and stable Respiratory status: spontaneous breathing and respiratory function stable Cardiovascular status: blood pressure returned to baseline and stable Postop Assessment: spinal receding Anesthetic complications: no    Last Vitals:  Vitals:   02/06/16 1720 02/06/16 1735  BP: (!) 144/88 (!) 144/84  Pulse: 75 74  Resp: 17 19  Temp:      Last Pain:  Vitals:   02/06/16 1700  TempSrc:   PainSc: 2                  Tiajuana Amass

## 2016-02-06 NOTE — Transfer of Care (Signed)
Immediate Anesthesia Transfer of Care Note  Patient: Kayla Price  Procedure(s) Performed: Procedure(s): TOTAL KNEE ARTHROPLASTY (Left)  Patient Location: PACU  Anesthesia Type:Spinal and MAC combined with regional for post-op pain  Level of Consciousness: awake, alert , oriented and patient cooperative  Airway & Oxygen Therapy: Patient Spontanous Breathing  Post-op Assessment: Report given to RN and Post -op Vital signs reviewed and stable  Post vital signs: Reviewed and stable  Last Vitals:  Vitals:   02/06/16 1208 02/06/16 1210  BP: (!) 155/86 (!) 144/84  Pulse: 65 68  Resp: 16 18  Temp:      Last Pain:  Vitals:   02/06/16 1101  TempSrc: Oral         Complications: No apparent anesthesia complications

## 2016-02-06 NOTE — Discharge Instructions (Signed)

## 2016-02-06 NOTE — Op Note (Signed)
PATIENT ID:      Kayla Price  MRN:     [id number] DOB/AGE:    [birth year] / 60 y.o.       OPERATIVE REPORT    DATE OF PROCEDURE:  02/06/2016       PREOPERATIVE DIAGNOSIS:   LEFT KNEE OSTEOARTHRITIS      Estimated body mass index is 40.57 kg/m as calculated from the following:   Height as of 01/27/16: 5\' 7"  (1.702 m).   Weight as of 01/27/16: 117.5 kg (259 lb).                                                        POSTOPERATIVE DIAGNOSIS:   Left Knee Osteoarthritis                                                                      PROCEDURE:  Procedure(s): TOTAL KNEE ARTHROPLASTY Using DepuyAttune RP implants #6L Femur, #8Tibia, 5 mm Attune RP bearing, 38 Patella     SURGEON: Cosima Prentiss J    ASSISTANT:   Eric K. [employer]   (Present and scrubbed throughout the case, critical for assistance with exposure, retraction, instrumentation, and closure.)         ANESTHESIA: spinal, 20cc Exparel, 20cc 0.5% Marcaine  EBL: 400  FLUID REPLACEMENT: 1600 crystalloid  TOURNIQUET TIME:  Drains: None  Tranexamic Acid: 1gm iv, 2gm topical   COMPLICATIONS:  None         INDICATIONS FOR PROCEDURE: The patient has  LEFT KNEE OSTEOARTHRITIS, VAR deformities, XR shows bone on bone arthritis, lateral subluxation of tibia. Patient has failed all conservative measures including anti-inflammatory medicines, narcotics, attempts at  exercise and weight loss, cortisone injections and viscosupplementation.  Risks and benefits of surgery have been discussed, questions answered.   DESCRIPTION OF PROCEDURE: The patient identified by armband, received  IV antibiotics, in the holding area at [facility]. Patient taken to the operating room, appropriate anesthetic  monitors were attached, and spinal anesthesia was  induced. Tourniquet  applied high to the operative thigh. Lateral post and foot positioner  applied to the table, the lower extremity was then prepped and draped  in usual sterile  fashion from the toes to the tourniquet. Time-out procedure was performed. We began the operation, with the knee flexed 120 degrees, by making the anterior midline incision starting at handbreadth above the patella going over the patella 1 cm medial to and 4 cm distal to the tibial tubercle. Small bleeders in the skin and the  subcutaneous tissue identified and cauterized. Transverse retinaculum was incised and reflected medially and a medial parapatellar arthrotomy was accomplished. the patella was everted and theprepatellar fat pad resected. The superficial medial collateral  ligament was then elevated from anterior to posterior along the proximal  flare of the tibia and anterior half of the menisci resected. The knee was hyperflexed exposing bone on bone arthritis. Peripheral and notch osteophytes as well as the cruciate ligaments were then resected. We continued to  work our way around posteriorly along the proximal tibia, and  externally  rotated the tibia subluxing it out from underneath the femur. A McHale  retractor was placed through the notch and a lateral Hohmann retractor  placed, and we then drilled through the proximal tibia in line with the  axis of the tibia followed by an intramedullary guide rod and 2-degree  posterior slope cutting guide. The tibial cutting guide, 3 degree posterior sloped, was pinned into place allowing resection of 4 mm of bone medially and 10 mm of bone laterally. Satisfied with the tibial resection, we then  entered the distal femur 2 mm anterior to the PCL origin with the  intramedullary guide rod and applied the distal femoral cutting guide  set at 9 mm, with 5 degrees of valgus. This was pinned along the  epicondylar axis. At this point, the distal femoral cut was accomplished without difficulty. We then sized for a #6L femoral component and pinned the guide in 0 degrees of external rotation. The chamfer cutting guide was pinned into place. The anterior,  posterior, and chamfer cuts were accomplished without difficulty followed by  the Attune RP box cutting guide and the box cut. We also removed posterior osteophytes from the posterior femoral condyles. At this  time, the knee was brought into full extension. We checked our  extension and flexion gaps and found them symmetric for a 5 mm bearing. Distracting in extension with a lamina spreader, the posterior horns of the menisci were removed, and Exparel, diluted to 60 cc, with 20cc NS, and 20cc 0.5% Marcaine,was injected into the capsule and synovium of the knee. The posterior patella cut was accomplished with the 9.5 mm Attune cutting guide, sized for a 38mm dome, and the fixation pegs drilled.The knee  was then once again hyperflexed exposing the proximal tibia. We sized for a # 8 tibial base plate, applied the smokestack and the conical reamer followed by the the Delta fin keel punch. We then hammered into place the Attune RP trial femoral component, drilled the lugs, inserted a  5 mm trial bearing, trial patellar button, and took the knee through range of motion from 0-130 degrees. No thumb pressure was required for patellar Tracking. At this point, the limb was wrapped with an Esmarch bandage and the tourniquet inflated to 350 mmHg. All trial components were removed, mating surfaces irrigated with pulse lavage, and dried with suction and sponges. A double batch of DePuy HV cement with 1500 mg of Zinacef was mixed and applied to all bony metallic mating surfaces except for the posterior condyles of the femur itself. In order, we  hammered into place the tibial tray and removed excess cement, the femoral component and removed excess cement. The final Attune RP bearing  was inserted, and the knee brought to full extension with compression.  The patellar button was clamped into place, and excess cement  removed. While the cement cured the wound was irrigated out with normal saline solution pulse lavage.  Ligament stability and patellar tracking were checked and found to be excellent. The parapatellar arthrotomy was closed with  running #1 Vicryl suture. The subcutaneous tissue with 0 and 2-0 undyed  Vicryl suture, and the skin with running 3-0 SQ vicryl. A dressing of Xeroform,  4 x 4, dressing sponges, Webril, and Ace wrap applied. The patient  awakened, and taken to recovery room without difficulty.   Gean Birchwood J 02/06/2016, 2:13 PM

## 2016-02-06 NOTE — Anesthesia Preprocedure Evaluation (Addendum)
Anesthesia Evaluation    Airway        Dental   Pulmonary           Cardiovascular hypertension,      Neuro/Psych    GI/Hepatic   Endo/Other  Hypothyroidism   Renal/GU Renal InsufficiencyRenal disease (creat 1.33)     Musculoskeletal  (+) Arthritis , Osteoarthritis,    Abdominal   Peds  Hematology  (+) HIV,   Anesthesia Other Findings H/o breast cancer  Reproductive/Obstetrics                             Anesthesia Physical Anesthesia Plan  ASA: III  Anesthesia Plan:    Post-op Pain Management:    Induction:   Airway Management Planned:   Additional Equipment:   Intra-op Plan:   Post-operative Plan:   Informed Consent:   Plan Discussed with:   Anesthesia Plan Comments:         Anesthesia Quick Evaluation

## 2016-02-07 ENCOUNTER — Encounter (HOSPITAL_COMMUNITY): Payer: Self-pay | Admitting: Orthopedic Surgery

## 2016-02-07 LAB — BASIC METABOLIC PANEL
ANION GAP: 9 (ref 5–15)
BUN: 10 mg/dL (ref 6–20)
CALCIUM: 9.7 mg/dL (ref 8.9–10.3)
CO2: 27 mmol/L (ref 22–32)
Chloride: 100 mmol/L — ABNORMAL LOW (ref 101–111)
Creatinine, Ser: 1.22 mg/dL — ABNORMAL HIGH (ref 0.44–1.00)
GFR calc Af Amer: 55 mL/min — ABNORMAL LOW (ref >60)
GFR, EST NON AFRICAN AMERICAN: 47 mL/min — AB (ref >60)
GLUCOSE: 125 mg/dL — AB (ref 65–99)
Potassium: 4 mmol/L (ref 3.5–5.1)
Sodium: 136 mmol/L (ref 135–145)

## 2016-02-07 LAB — CBC
HEMATOCRIT: 41.3 % (ref 36.0–46.0)
HEMOGLOBIN: 13.8 g/dL (ref 12.0–15.0)
MCH: 32.4 pg (ref 26.0–34.0)
MCHC: 33.4 g/dL (ref 30.0–36.0)
MCV: 96.9 fL (ref 78.0–100.0)
Platelets: 164 10*3/uL (ref 150–400)
RBC: 4.26 MIL/uL (ref 3.87–5.11)
RDW: 13.6 % (ref 11.5–15.5)
WBC: 9.8 10*3/uL (ref 4.0–10.5)

## 2016-02-07 NOTE — Evaluation (Signed)
Physical Therapy Evaluation Patient Details Name: Kayla Price MRN: [id number] DOB: Jan 15, 1956 Today's Date: 02/07/2016   History of Present Illness  Pt s/p Lt TKA 02/06/16  Clinical Impression  Pt presents with decreased strength and mobility and will benefit from skilled PT services to address deficits and improve functional mobility. Pt plans to d/c to SNF for continued rehab    Follow Up Recommendations SNF    Equipment Recommendations  Other (comment) (to be provided at [facility])    Recommendations for Other Services       Precautions / Restrictions Restrictions LLE Weight Bearing: Weight bearing as tolerated      Mobility  Bed Mobility Overal bed mobility: Needs Assistance Bed Mobility: Supine to Sit     Supine to sit: Supervision;HOB elevated     General bed mobility comments: pt uses rails and HOB elevated, able to perform supine to sit and scooting EOB without physical assistance  Transfers Overall transfer level: Needs assistance Equipment used: Rolling walker (2 wheeled) Transfers: Sit to/from [employer] Sit to Stand: Min guard Stand pivot transfers: Min guard       General transfer comment: steadying assist and cues for sequencing and safety with RW, no LOB  Ambulation/Gait Ambulation/Gait assistance: Min guard Ambulation Distance (Feet): 20 Feet Assistive device: Rolling walker (2 wheeled)       General Gait Details: pt with step to pattern due to Lt LE soreness, requires cues for safety with RW, no LOB  Stairs            Wheelchair Mobility    Modified Rankin (Stroke Patients Only)       Balance                                             Pertinent Vitals/Pain Pain Assessment: 0-10 Pain Score: 3  Pain Location: Lt knee Pain Descriptors / Indicators: Sore Pain Intervention(s): Limited activity within patient's tolerance;Monitored during session;Repositioned    Home Living Family/patient  expects to be discharged to:: Skilled nursing facility                      Prior Function Level of Independence: Independent               Hand Dominance        Extremity/Trunk Assessment   Upper Extremity Assessment: Overall WFL for tasks assessed           Lower Extremity Assessment: Generalized weakness      Cervical / Trunk Assessment: Normal  Communication   Communication: No difficulties  Cognition Arousal/Alertness: Awake/alert Behavior During Therapy: WFL for tasks assessed/performed Overall Cognitive Status: Within Functional Limits for tasks assessed                      General Comments      Exercises Total Joint Exercises Ankle Circles/Pumps: AROM;20 reps;Both;Seated      Assessment/Plan    PT Assessment Patient needs continued PT services  PT Diagnosis Difficulty walking;Generalized weakness;Acute pain   PT Problem List Decreased strength;Decreased mobility;Decreased activity tolerance;Decreased range of motion;Decreased balance;Decreased knowledge of use of DME;Pain  PT Treatment Interventions DME instruction;Gait training;Stair training;Functional mobility training;Neuromuscular re-education;Manual techniques;Balance training;Therapeutic exercise;Therapeutic activities;Patient/family education;Modalities   PT Goals (Current goals can be found in the Care Plan section) Acute Rehab PT Goals Patient Stated Goal: feel  better PT Goal Formulation: With patient Time For Goal Achievement: 02/21/16 Potential to Achieve Goals: Good    Frequency 7X/week   Barriers to discharge        Co-evaluation               End of Session   Activity Tolerance: Patient tolerated treatment well Patient left: in chair;with call bell/phone within reach Nurse Communication: Mobility status         Time: 5409-8119 PT Time Calculation (min) (ACUTE ONLY): 21 min   Charges:   PT Evaluation $PT Eval Moderate Complexity: 1  Procedure PT Treatments $Gait Training: 8-22 mins   PT G Codes:        Andrena Margerum 02/13/16, 8:20 AM

## 2016-02-07 NOTE — Progress Notes (Signed)
OT NOTE  Pt is Medicare and current D/C plan is SNF. No apparent immediate acute care OT needs, therefore will defer OT to SNF. If OT eval is needed please call Acute Rehab Dept. at [phone] or text page OT at [phone].   Peri Maris

## 2016-02-07 NOTE — NC FL2 (Signed)
[place] MEDICAID FL2 LEVEL OF CARE SCREENING TOOL     IDENTIFICATION  Patient Name: Kayla Price Birthdate: [birth year] Sex: female Admission Date (Current Location): 02/06/2016  [facility] and [place] Number:  Producer, television/film/video and Address:  [facility], [address] [place], [place] [address]      Provider Number: [id number]  Attending Physician Name and Address:  Gean Birchwood, MD  Relative Name and Phone Number:       Current Level of Care: Hospital Recommended Level of Care: Skilled Nursing Facility Prior Approval Number:    Date Approved/Denied:   PASRR Number:  [id number])  Discharge Plan: SNF    Current Diagnoses: Patient Active Problem List   Diagnosis Date Noted  . Primary localized osteoarthritis of left knee 02/06/2016  . Primary osteoarthritis of left knee 02/05/2016  . Osteoarthritis of left knee 06/08/2015  . CKD (chronic kidney disease) stage 3, GFR 30-59 ml/min 06/08/2015  . Vitamin D deficiency 09/23/2014  . Acute renal insufficiency 04/23/2014  . 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 Bell's palsy 12/04/2012  . Pharyngitis 01/14/2012  . Contact dermatitis 08/29/2011  . Dry eyes 08/20/2011  . Dry mouth 08/20/2011  . Neuropathy (HCC) 04/09/2011  . Insomnia 04/09/2011  . Obesities, morbid (HCC) 09/27/2010  . Endometrial polyp 12/22/2009  . ALOPECIA AREATA 11/28/2009  . ACUTE NASOPHARYNGITIS 07/18/2009  . MENORRHAGIA, POSTMENOPAUSAL 02/03/2009  . ABNORMAL GLANDULAR PAPANICOLAOU SMEAR OF CERVIX 11/04/2008  . BROKEN TOOTH, WITH COMPLICATION 09/23/2008  . ALOPECIA 05/06/2008  . TRIGGER FINGER 05/06/2008  . HIP FRACTURE, RIGHT 05/06/2008  . ARTHROSCOPY, LEFT KNEE, HX OF 02/03/2008  . HYPERLIPIDEMIA, MIXED 12/15/2007  . HAND PAIN, RIGHT 12/15/2007  . Other abnormal glucose 12/15/2007  . UNSPECIFIED VITAMIN D DEFICIENCY 08/06/2007  . TINEA CAPITIS 08/04/2007  . CNTC DERMATITIS&OTH ECZEMA DUE OTH CHEM PRODUCTS  08/04/2007  . PVD 04/07/2007  . Secondary localized osteoarthrosis 04/07/2007  . Human immunodeficiency virus (HIV) disease (HCC) 03/27/2006  . Essential hypertension 03/27/2006  . BREAST CANCER, HX OF 03/27/2006    Orientation RESPIRATION BLADDER Height & Weight     Time, Self, Situation, Place  Normal Continent Weight:   Height:     BEHAVIORAL SYMPTOMS/MOOD NEUROLOGICAL BOWEL NUTRITION STATUS      Continent    AMBULATORY STATUS COMMUNICATION OF NEEDS Skin   Limited Assist Verbally Normal                       Personal Care Assistance Level of Assistance  Bathing, Dressing Bathing Assistance: Limited assistance   Dressing Assistance: Limited assistance     Functional Limitations Info             SPECIAL CARE FACTORS FREQUENCY                       Contractures      Additional Factors Info                  Current Medications (02/07/2016):  This is the current hospital active medication list Current Facility-Administered Medications  Medication Dose Route Frequency Provider Last Rate Last Dose  . acetaminophen (TYLENOL) tablet 650 mg  650 mg Oral Q6H PRN Allena Katz, PA-C       Or  . acetaminophen (TYLENOL) suppository 650 mg  650 mg Rectal Q6H PRN Allena Katz, PA-C      . alum & mag hydroxide-simeth (MAALOX/MYLANTA) 200-200-20 MG/5ML suspension 30 mL  30 mL Oral Q4H PRN Allena Katz, PA-C      . aspirin EC tablet 325 mg  325 mg Oral Q breakfast Allena Katz, PA-C   325 mg at 02/07/16 0851  . bisacodyl (DULCOLAX) EC tablet 5 mg  5 mg Oral Daily PRN Allena Katz, PA-C      . darunavir-cobicistat (PREZCOBIX) 800-150 MG per tablet 1 tablet  1 tablet Oral Daily Allena Katz, PA-C   1 tablet at 02/07/16 [phone]  . dextrose 5 % and 0.45 % NaCl with KCl 20 mEq/L infusion   Intravenous Continuous Allena Katz, PA-C 125 mL/hr at 02/06/16 1902    . diphenhydrAMINE (BENADRYL) 12.5 MG/5ML elixir 12.5-25 mg  12.5-25 mg Oral Q4H PRN Allena Katz, PA-C      . docusate sodium (COLACE) capsule 100 mg  100 mg Oral BID Allena Katz, PA-C   100 mg at 02/07/16 0851  . dolutegravir (TIVICAY) tablet 50 mg  50 mg Oral Daily Allena Katz, PA-C   50 mg at 02/07/16 0856  . hydrochlorothiazide (HYDRODIURIL) tablet 25 mg  25 mg Oral Daily Gean Birchwood, MD   25 mg at 02/07/16 0851  . HYDROmorphone (DILAUDID) injection 0.5 mg  0.5 mg Intravenous Q2H PRN Allena Katz, PA-C      . irbesartan (AVAPRO) tablet 300 mg  300 mg Oral Daily Gean Birchwood, MD   300 mg at 02/07/16 0851  . levothyroxine (SYNTHROID, LEVOTHROID) tablet 150 mcg  150 mcg Oral QAC breakfast Allena Katz, PA-C   150 mcg at 02/07/16 0851  . loratadine (CLARITIN) tablet 10 mg  10 mg Oral Daily PRN Allena Katz, PA-C      . menthol-cetylpyridinium (CEPACOL) lozenge 3 mg  1 lozenge Oral PRN Allena Katz, PA-C       Or  . phenol (CHLORASEPTIC) mouth spray 1 spray  1 spray Mouth/Throat PRN Allena Katz, PA-C      . methocarbamol (ROBAXIN) tablet 500 mg  500 mg Oral Q6H PRN Allena Katz, PA-C   500 mg at 02/06/16 2333   Or  . methocarbamol (ROBAXIN) 500 mg in dextrose 5 % 50 mL IVPB  500 mg Intravenous Q6H PRN Allena Katz, PA-C      . metoCLOPramide (REGLAN) tablet 5-10 mg  5-10 mg Oral Q8H PRN Allena Katz, PA-C       Or  . metoCLOPramide (REGLAN) injection 5-10 mg  5-10 mg Intravenous Q8H PRN Allena Katz, PA-C      . ondansetron [facility]) tablet 4 mg  4 mg Oral Q6H PRN Allena Katz, PA-C       Or  . ondansetron [facility]) injection 4 mg  4 mg Intravenous Q6H PRN Allena Katz, PA-C      . oxyCODONE (Oxy IR/ROXICODONE) immediate release tablet 5-10 mg  5-10 mg Oral Q3H PRN Allena Katz, PA-C   10 mg at 02/07/16 0646  . rilpivirine (EDURANT) tablet 25 mg  25 mg Oral Q breakfast Allena Katz, PA-C   25 mg at 02/07/16 9604  . senna-docusate (Senokot-S) tablet 1 tablet  1 tablet Oral QHS PRN Allena Katz, PA-C      . sodium phosphate (FLEET)  7-19 GM/118ML enema 1 enema  1 enema Rectal Once PRN Allena Katz, PA-C         Discharge Medications: Please see discharge summary for a list of discharge medications.  Relevant Imaging  Results:  Relevant Lab Results:   Additional Information  (SS:  [phone])  Yemaya Barnier R

## 2016-02-07 NOTE — Progress Notes (Signed)
PATIENT ID: Kayla Price  MRN: [id number]  DOB/AGE:  60-Sep-1957 / 60 y.o.  1 Day Post-Op Procedure(s) (LRB): TOTAL KNEE ARTHROPLASTY (Left)    PROGRESS NOTE Subjective: Patient is alert, oriented, no Nausea, no Vomiting, yes passing gas. Taking PO well. Denies SOB, Chest or Calf Pain. Using Incentive Spirometer, PAS in place. Ambulate WBAT, CPM 0-60 Patient reports pain as 4/10 .    Objective: Vital signs in last 24 hours: Vitals:   02/06/16 1720 02/06/16 1735 02/06/16 2230 02/07/16 0140  BP: (!) 144/88 (!) 144/84 136/74 134/80  Pulse: 75 74 75 78  Resp: '17 19 18 17  '$ Temp:   97.6 F (36.4 C) 98.5 F (36.9 C)  TempSrc:   Oral Oral  SpO2: 100% 98% 100% 98%      Intake/Output from previous day: I/O last 3 completed shifts: In: 3100 [P.O.:340; I.V.:2300; Other:350; IV Piggyback:110] Out: 201 [Urine:1; Blood:200]   Intake/Output this shift: No intake/output data recorded.   LABORATORY DATA: No results for input(s): WBC, HGB, HCT, PLT, NA, K, CL, CO2, BUN, CREATININE, GLUCOSE, GLUCAP, INR, CALCIUM in the last 72 hours.  Invalid input(s): PT, 2  Examination: Neurologically intact ABD soft Neurovascular intact Sensation intact distally Intact pulses distally Dorsiflexion/Plantar flexion intact Incision: dressing C/D/I No cellulitis present Compartment soft}  Assessment:   1 Day Post-Op Procedure(s) (LRB): TOTAL KNEE ARTHROPLASTY (Left) ADDITIONAL DIAGNOSIS: Expected Acute Blood Loss Anemia, HIV, HTN, neuropathy, morbid obesity  Plan: PT/OT WBAT, CPM 5/hrs day until ROM 0-90 degrees, then D/C CPM DVT Prophylaxis:  SCDx72hrs, ASA 325 mg BID x 2 weeks DISCHARGE PLAN: Skilled Nursing Facility/Rehab, [facility] DISCHARGE NEEDS: HHPT, CPM, Walker and 3-in-1 comode seat     Kayla Price J 02/07/2016, 7:10 AM

## 2016-02-07 NOTE — Clinical Social Work Note (Signed)
Clinical Social Work Assessment  Patient Details  Name: Kayla Price MRN: [id number] Date of Birth: [birth year]  Date of referral:  02/07/16               Reason for consult:  Facility Placement                Permission sought to share information with:   Designer, jewellery) Permission granted to share information::   Designer, jewellery)  Name::        Agency::     Relationship::     Contact Information:     Housing/Transportation Living arrangements for the past 2 months:  Single Family Home (Patient states that she lives at home in [place] with her 2 adult children.) Source of Information:  Patient Patient Interpreter Needed:  None Criminal Activity/Legal Involvement Pertinent to Current Situation/Hospitalization:  No - Comment as needed Significant Relationships:  Adult Children (Patient states that her 2 adult children live at home with her and are supportive.) Lives with:  Adult Children Do you feel safe going back to the place where you live?   (Patient would like to go to [facility]) Need for family participation in patient care:     Care giving concerns:  Patient informed SW that prior to coming to [facility] she was able to complete her ADL's. However, she states after surgery she now needs assistance with ADLS's.   Social Worker assessment / plan:  SW met with pt at bedside. Patient was alert and oriented. There was no family present. Patient confirms that she presents to [facility] due to L knee replacement. Patient states that prior to having her surgery she was able do ADL's independently, but now states that she needs assistance. Patient confirms that she has reservations for [facility]. Facility confirms that they are aware of pt and she is welcomed to come upon d/c.  Patient states that her primary support are her children.  Employment status:  Retired Forensic scientist:   [employer]) PT Recommendations:  [place] / Referral to community  resources:   (Patient is being referred to [employer]. Patient states that she has been pre-accepted.)  Patient/Family's Response to care:  Patient is appropriate at this time.  Patient/Family's Understanding of and Emotional Response to Diagnosis, Current Treatment, and Prognosis:  Patient states that she has no questions for SW at this time.  Emotional Assessment Appearance:  Appears stated age Attitude/Demeanor/Rapport:   (Accepting, Appropriate.) Affect (typically observed):  Accepting, Appropriate Orientation:  Oriented to Place, Oriented to Self, Oriented to Situation Alcohol / Substance use:  Not Applicable Psych involvement (Current and /or in the community):  No (Comment)  Discharge Needs  Concerns to be addressed:  Adjustment to Illness Readmission within the last 30 days:  No Current discharge risk:  None Barriers to Discharge:  No Barriers Identified   Bernita Buffy 02/07/2016, 10:48 AM

## 2016-02-07 NOTE — Plan of Care (Signed)
Problem: Safety: Goal: Ability to remain free from injury will improve Outcome: Progressing Safety precautions maintained, no fall or injury noted this shift  Problem: Physical Regulation: Goal: Will remain free from infection Outcome: Progressing VS WNL, no signs of infection noted  Problem: Tissue Perfusion: Goal: Risk factors for ineffective tissue perfusion will decrease Outcome: Progressing Denies S/S of DVT  Problem: Activity: Goal: Risk for activity intolerance will decrease Outcome: Progressing oob to bsc with 2 assistance, tolerated well  Problem: Fluid Volume: Goal: Ability to maintain a balanced intake and output will improve Outcome: Progressing Taking PO fluids well and voiding without difficulty  Problem: Bowel/Gastric: Goal: Will not experience complications related to bowel motility Outcome: Progressing No bowel issues noted

## 2016-02-07 NOTE — Progress Notes (Signed)
Orthopedic Tech Progress Note Patient Details:  Kayla Price 08-09-1955 [id number] Ortho visit put on cpm at 1905 turned up 10 degrees cpm at 0-50 Patient ID: Kayla Price, female   DOB: [birth year], 60 y.o.   MRN: [id number]   Braulio Bosch 02/07/2016, 7:05 PM

## 2016-02-08 ENCOUNTER — Encounter (HOSPITAL_COMMUNITY): Payer: Self-pay | Admitting: General Practice

## 2016-02-08 DIAGNOSIS — R2681 Unsteadiness on feet: Secondary | ICD-10-CM | POA: Diagnosis not present

## 2016-02-08 DIAGNOSIS — J309 Allergic rhinitis, unspecified: Secondary | ICD-10-CM | POA: Diagnosis not present

## 2016-02-08 DIAGNOSIS — E782 Mixed hyperlipidemia: Secondary | ICD-10-CM | POA: Diagnosis not present

## 2016-02-08 DIAGNOSIS — E039 Hypothyroidism, unspecified: Secondary | ICD-10-CM | POA: Diagnosis not present

## 2016-02-08 DIAGNOSIS — I739 Peripheral vascular disease, unspecified: Secondary | ICD-10-CM | POA: Diagnosis not present

## 2016-02-08 DIAGNOSIS — Z471 Aftercare following joint replacement surgery: Secondary | ICD-10-CM | POA: Diagnosis not present

## 2016-02-08 DIAGNOSIS — N183 Chronic kidney disease, stage 3 (moderate): Secondary | ICD-10-CM | POA: Diagnosis not present

## 2016-02-08 DIAGNOSIS — K5901 Slow transit constipation: Secondary | ICD-10-CM | POA: Diagnosis not present

## 2016-02-08 DIAGNOSIS — I10 Essential (primary) hypertension: Secondary | ICD-10-CM | POA: Diagnosis not present

## 2016-02-08 DIAGNOSIS — E785 Hyperlipidemia, unspecified: Secondary | ICD-10-CM | POA: Diagnosis not present

## 2016-02-08 DIAGNOSIS — M1712 Unilateral primary osteoarthritis, left knee: Secondary | ICD-10-CM | POA: Diagnosis not present

## 2016-02-08 DIAGNOSIS — D72829 Elevated white blood cell count, unspecified: Secondary | ICD-10-CM | POA: Diagnosis not present

## 2016-02-08 DIAGNOSIS — R262 Difficulty in walking, not elsewhere classified: Secondary | ICD-10-CM | POA: Diagnosis not present

## 2016-02-08 DIAGNOSIS — B20 Human immunodeficiency virus [HIV] disease: Secondary | ICD-10-CM | POA: Diagnosis not present

## 2016-02-08 DIAGNOSIS — Z96652 Presence of left artificial knee joint: Secondary | ICD-10-CM | POA: Diagnosis not present

## 2016-02-08 DIAGNOSIS — E038 Other specified hypothyroidism: Secondary | ICD-10-CM | POA: Diagnosis not present

## 2016-02-08 DIAGNOSIS — D500 Iron deficiency anemia secondary to blood loss (chronic): Secondary | ICD-10-CM | POA: Diagnosis not present

## 2016-02-08 DIAGNOSIS — M6281 Muscle weakness (generalized): Secondary | ICD-10-CM | POA: Diagnosis not present

## 2016-02-08 DIAGNOSIS — K5900 Constipation, unspecified: Secondary | ICD-10-CM | POA: Diagnosis not present

## 2016-02-08 LAB — CBC
HEMATOCRIT: 38.6 % (ref 36.0–46.0)
Hemoglobin: 12.8 g/dL (ref 12.0–15.0)
MCH: 31.8 pg (ref 26.0–34.0)
MCHC: 33.2 g/dL (ref 30.0–36.0)
MCV: 96 fL (ref 78.0–100.0)
Platelets: 164 10*3/uL (ref 150–400)
RBC: 4.02 MIL/uL (ref 3.87–5.11)
RDW: 13.6 % (ref 11.5–15.5)
WBC: 12 10*3/uL — AB (ref 4.0–10.5)

## 2016-02-08 NOTE — Clinical Social Work Placement (Signed)
   CLINICAL SOCIAL WORK PLACEMENT  NOTE  Date:  02/08/2016  Patient Details  Name: Kayla Price MRN: [id number] Date of Birth: Oct 07, 1955  Clinical Social Work is seeking post-discharge placement for this patient at the [place] level of care (*CSW will initial, date and re-position this form in  chart as items are completed):  Yes   Patient/family provided with [facility] Work Department's list of facilities offering this level of care within the geographic area requested by the patient (or if unable, by the patient's family).  Yes   Patient/family informed of their freedom to choose among providers that offer the needed level of care, that participate in Medicare, Medicaid or managed care program needed by the patient, have an available bed and are willing to accept the patient.  Yes   Patient/family informed of 's ownership interest in [facility] and [facility], as well as of the fact that they are under no obligation to receive care at these facilities.  PASRR submitted to EDS on       PASRR number received on       Existing PASRR number confirmed on       FL2 transmitted to all facilities in geographic area requested by pt/family on       FL2 transmitted to all facilities within larger geographic area on       Patient informed that his/her managed care company has contracts with or will negotiate with certain facilities, including the following:        Yes (Patient will return to [facility] and is accepting.)   Patient/family informed of bed offers received.  Patient chooses bed at  (Patient chooses to return to [facility])     Physician recommends and patient chooses bed at      Patient to be transferred to  ([facility]) on 02/08/16.  Patient to be transferred to facility by  ([facility] will provide transportation)     Patient family notified on  (Patient informed facility will pick her up at 4:00pm) of  transfer.  Name of family member notified:        PHYSICIAN       Additional Comment:    _______________________________________________ Bernita Buffy 02/08/2016, 11:54 AM

## 2016-02-08 NOTE — Discharge Summary (Signed)
Patient ID: Kayla Price MRN: [id number] DOB/AGE: 60/07/1955 60 y.o.  Admit date: 02/06/2016 Discharge date: 02/08/2016  Admission Diagnoses:  Principal Problem:   Primary osteoarthritis of left knee Active Problems:   Primary localized osteoarthritis of left knee   Discharge Diagnoses:  Same  Past Medical History:  Diagnosis Date  . Alopecia areata 11/28/2009  . Bell's palsy   . BREAST CANCER, HX OF 03/27/2006  . Cancer [facility]) 2005   Breast cancer   chemotherapy and radiation  . CKD (chronic kidney disease) stage 3, GFR 30-59 ml/min 06/08/2015  . Dry eye syndrome   . Fasting hyperglycemia   . HIP FRACTURE, RIGHT 05/06/2008  . HIV (human immunodeficiency virus infection) (HCC)   . HIV DISEASE 03/27/2006  . HYPERLIPIDEMIA, MIXED 12/15/2007  . HYPERTENSION 03/27/2006  . Hypertension   . 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 MENORRHAGIA, POSTMENOPAUSAL 02/03/2009  . Osteoarthritis of left knee 06/08/2015  . OSTEOARTHROSIS, LOCAL, SCND, UNSPC SITE 04/07/2007  . PVD 04/07/2007  . Tinea capitis   . TRIGGER FINGER 05/06/2008  . Unspecified vitamin D deficiency 08/06/2007    Surgeries: Procedure(s): TOTAL KNEE ARTHROPLASTY on 02/06/2016   Consultants:   Discharged Condition: Improved  Hospital Course: Kayla Price is an 60 y.o. female who was admitted 02/06/2016 for operative treatment ofPrimary osteoarthritis of left knee. Patient has severe unremitting pain that affects sleep, daily activities, and work/hobbies. After pre-op clearance the patient was taken to the operating room on 02/06/2016 and underwent  Procedure(s): TOTAL KNEE ARTHROPLASTY.    Patient was given perioperative antibiotics: Anti-infectives    Start     Dose/Rate Route Frequency Ordered Stop   02/07/16 0800  rilpivirine (EDURANT) tablet 25 mg     25 mg Oral Daily with breakfast 02/06/16 1812     02/06/16 1900  dolutegravir (TIVICAY) tablet 50 mg     50 mg Oral Daily 02/06/16 1812     02/06/16  1900  darunavir-cobicistat (PREZCOBIX) 800-150 MG per tablet 1 tablet     1 tablet Oral Daily 02/06/16 1812     02/06/16 1348  cefUROXime (ZINACEF) injection  Status:  Discontinued       As needed 02/06/16 1349 02/06/16 1443   02/06/16 0600  ceFAZolin (ANCEF) IVPB 2g/100 mL premix     2 g 200 mL/hr over 30 Minutes Intravenous To [facility]Stay [facility] 02/05/16 1419 02/06/16 1227       Patient was given sequential compression devices, early ambulation, and chemoprophylaxis to prevent DVT.  Patient benefited maximally from hospital stay and there were no complications.    Recent vital signs: Patient Vitals for the past 24 hrs:  BP Temp Temp src Pulse Resp SpO2  02/08/16 0500 102/66 98.1 F (36.7 C) Oral 82 17 100 %  02/07/16 2104 (!) 96/52 98.2 F (36.8 C) Oral 85 16 95 %  02/07/16 1300 135/76 98.3 F (36.8 C) Oral 79 16 100 %     Recent laboratory studies:  Recent Labs  02/07/16 0620 02/08/16 0606  WBC 9.8 12.0*  HGB 13.8 12.8  HCT 41.3 38.6  PLT 164 164  NA 136  --   K 4.0  --   CL 100*  --   CO2 27  --   BUN 10  --   CREATININE 1.22*  --   GLUCOSE 125*  --   CALCIUM 9.7  --      Discharge Medications:     Medication List    STOP taking these medications  HYDROcodone-acetaminophen 5-325 MG tablet Commonly known as:  NORCO     TAKE these medications   ascorbic acid 250 MG Chew Commonly known as:  VITAMIN C Chew 500 mg by mouth daily.   aspirin EC 325 MG tablet Take 1 tablet (325 mg total) by mouth 2 (two) times daily.   co-enzyme Q-10 30 MG capsule Take 30 mg by mouth 3 (three) times daily.   D3 ADULT PO Take 2,000 Units by mouth daily.   EDURANT 25 MG Tabs tablet Generic drug:  rilpivirine TAKE 1 TABLET BY MOUTH EVERY DAY WITH BREAKFAST   levothyroxine 150 MCG tablet Commonly known as:  SYNTHROID, LEVOTHROID TAKE 1 TABLET (150 MCG TOTAL) BY MOUTH DAILY BEFORE BREAKFAST.   loratadine 10 MG tablet Commonly known as:  CLARITIN Take 10 mg by mouth  daily as needed for itching. Reported on 06/08/2015   olmesartan-hydrochlorothiazide 40-25 MG tablet Commonly known as:  BENICAR HCT Take 1 tablet by mouth daily.   oxyCODONE-acetaminophen 5-325 MG tablet Commonly known as:  ROXICET Take 1 tablet by mouth every 4 (four) hours as needed.   Pitavastatin Calcium 4 MG Tabs Take 1 tablet (4 mg total) by mouth daily. This may be placebo, study provided. Do not dispense.   PREZCOBIX 800-150 MG tablet Generic drug:  darunavir-cobicistat TAKE 1 TABLET BY MOUTH DAILY. SWALLOW WHOLE. DO NOT CRUSH, BREAK OR CHEW TABLETS. TAKE WITH FOOD.   PROBIOTIC COMPLEX ACIDOPHILUS Caps Take 1 capsule by mouth daily.   SUPER B COMPLEX PO Take 1 tablet by mouth daily. Reported on 06/08/2015   TIVICAY 50 MG tablet Generic drug:  dolutegravir TAKE 1 TABLET BY MOUTH EVERY DAY   tiZANidine 2 MG tablet Commonly known as:  ZANAFLEX Take 1 tablet (2 mg total) by mouth every 6 (six) hours as needed for muscle spasms.   VITAMIN E PO Take 1 tablet by mouth daily. 400 units       Diagnostic Studies: Dg Chest 2 View  Result Date: 01/27/2016 CLINICAL DATA:  Preop for total knee arthroplasty. EXAM: CHEST  2 VIEW COMPARISON:  Radiographs of June 23, 2013. FINDINGS: The heart size and mediastinal contours are within normal limits. Both lungs are clear. No pneumothorax or pleural effusion is noted. The visualized skeletal structures are unremarkable. IMPRESSION: No active cardiopulmonary disease. Electronically Signed   By: Lupita Raider, M.D.   On: 01/27/2016 11:31    Disposition: 01-Home or Self Care  Discharge Instructions    CPM    Complete by:  As directed   Continuous passive motion machine (CPM):      Use the CPM from 0 to 60  for 5 hours per day.      You may increase by 10 degrees per day.  You may break it up into 2 or 3 sessions per day.      Use CPM for 2 weeks or until you are told to stop.   Call MD / Call 911    Complete by:  As directed    If you experience chest pain or shortness of breath, CALL 911 and be transported to the hospital emergency room.  If you develope a fever above 101 F, pus (white drainage) or increased drainage or redness at the wound, or calf pain, call your surgeon's office.   Constipation Prevention    Complete by:  As directed   Drink plenty of fluids.  Prune juice may be helpful.  You may use a stool softener, such as Colace (  over the counter) 100 mg twice a day.  Use MiraLax (over the counter) for constipation as needed.   DO NOT drive, shower or take a tub bath until instructed by your physician    Complete by:  As directed   Diet - low sodium heart healthy    Complete by:  As directed   Driving restrictions    Complete by:  As directed   No driving for 2 weeks   Increase activity slowly as tolerated    Complete by:  As directed   Patient may shower    Complete by:  As directed   You may shower without a dressing once there is no drainage.  Do not wash over the wound.  If drainage remains, cover wound with plastic wrap and then shower.      Follow-up Information    Nestor Lewandowsky, MD Follow up in 2 week(s).   Specialty:  Orthopedic Surgery Contact information: [address] [place] [address] [phone]            Signed: Vear Clock [place] Redwine R 02/08/2016, 7:50 AM

## 2016-02-08 NOTE — Progress Notes (Signed)
SW was notified by CSW of patient's authorization number for insurance : [phone].  SW called [facility]/facility and made them aware of number. They state pt is welcomed to come. SW made nurse aware and provided nurse report number. SW made pt aware.  Transportation has been called.  Nurse Report number: [id number].

## 2016-02-08 NOTE — Progress Notes (Signed)
PATIENT ID: Kayla Price  MRN: [id number]  DOB/AGE:  1956/04/04 / 60 y.o.  2 Days Post-Op Procedure(s) (LRB): TOTAL KNEE ARTHROPLASTY (Left)    PROGRESS NOTE Subjective: Patient is alert, oriented, no Nausea, no Vomiting, yes passing gas. Taking PO well. Denies SOB, Chest or Calf Pain. Using Incentive Spirometer, PAS in place. Ambulate WBAT, CPM 0-50 Patient reports pain as 4/10 .    Objective: Vital signs in last 24 hours: Vitals:   02/07/16 0650 02/07/16 1300 02/07/16 2104 02/08/16 0500  BP: (!) 141/73 135/76 (!) 96/52 102/66  Pulse: 79 79 85 82  Resp: '17 16 16 17  '$ Temp: 99 F (37.2 C) 98.3 F (36.8 C) 98.2 F (36.8 C) 98.1 F (36.7 C)  TempSrc: Oral Oral Oral Oral  SpO2: 96% 100% 95% 100%      Intake/Output from previous day: I/O last 3 completed shifts: In: 1595 [P.O.:720; I.V.:875] Out: 351 [Urine:351]   Intake/Output this shift: No intake/output data recorded.   LABORATORY DATA:  Recent Labs  02/07/16 0620 02/08/16 0606  WBC 9.8 12.0*  HGB 13.8 12.8  HCT 41.3 38.6  PLT 164 164  NA 136  --   K 4.0  --   CL 100*  --   CO2 27  --   BUN 10  --   CREATININE 1.22*  --   GLUCOSE 125*  --   CALCIUM 9.7  --     Examination: Neurologically intact Neurovascular intact Sensation intact distally Intact pulses distally Dorsiflexion/Plantar flexion intact Incision: dressing C/D/I No cellulitis present Compartment soft}  Assessment:   2 Days Post-Op Procedure(s) (LRB): TOTAL KNEE ARTHROPLASTY (Left) ADDITIONAL DIAGNOSIS: Expected Acute Blood Loss Anemia,HIV, HTN, neuropathy, morbid obesity   Plan: PT/OT WBAT, CPM 5/hrs day until ROM 0-90 degrees, then D/C CPM DVT Prophylaxis:  SCDx72hrs, ASA 325 mg BID x 2 weeks DISCHARGE PLAN: Skilled Nursing Facility/Rehab DISCHARGE NEEDS: HHPT, CPM, Walker and 3-in-1 comode seat     PHILLIPS, ERIC R 02/08/2016, 7:47 AM

## 2016-02-08 NOTE — Clinical Social Work Placement (Signed)
   CLINICAL SOCIAL WORK PLACEMENT  NOTE  Date:  02/08/2016  Patient Details  Name: Kayla Price MRN: [id number] Date of Birth: 1956/01/28  Clinical Social Work is seeking post-discharge placement for this patient at the [place] level of care (*CSW will initial, date and re-position this form in  chart as items are completed):  Yes   Patient/family provided with [facility] Work Department's list of facilities offering this level of care within the geographic area requested by the patient (or if unable, by the patient's family).  Yes   Patient/family informed of their freedom to choose among providers that offer the needed level of care, that participate in Medicare, Medicaid or managed care program needed by the patient, have an available bed and are willing to accept the patient.  Yes   Patient/family informed of 's ownership interest in [facility] and [facility], as well as of the fact that they are under no obligation to receive care at these facilities.  PASRR submitted to EDS on       PASRR number received on       Existing PASRR number confirmed on       FL2 transmitted to all facilities in geographic area requested by pt/family on       FL2 transmitted to all facilities within larger geographic area on       Patient informed that his/her managed care company has contracts with or will negotiate with certain facilities, including the following:        Yes (Patient will return to [facility] and is accepting.)   Patient/family informed of bed offers received.  Patient chooses bed at  (Patient chooses [facility])     Physician recommends and patient chooses bed at      Patient to be transferred to  [facility]) on 02/08/16.  Patient to be transferred to facility by  [facility])     Patient family notified on 02/08/16 ([place]) of transfer.  Name of family member notified:        PHYSICIAN       Additional Comment:     _______________________________________________ Tilda Burrow R 02/08/2016, 12:00 PM

## 2016-02-08 NOTE — Progress Notes (Signed)
Report gave to RN Kenney Houseman at [facility]. Pt's IV d/c without complication. Pain is well managed. Pt is ready to discharge.

## 2016-02-08 NOTE — Progress Notes (Signed)
Physical Therapy Treatment Patient Details Name: Kayla Price MRN: [id number] DOB: 1955-11-13 Today's Date: 02/08/2016    History of Present Illness Pt s/p Lt TKA 02/06/16    PT Comments    Pt performed increased gait distance during session.  Pt remains with slow cadence and will continue to beneft from rehab in a post acute setting to improve strength and functional mobility before returning home.    Follow Up Recommendations  SNF     Equipment Recommendations  Other (comment) (TBD at SNF)    Recommendations for Other Services       Precautions / Restrictions Restrictions Weight Bearing Restrictions: Yes LLE Weight Bearing: Weight bearing as tolerated    Mobility  Bed Mobility Overal bed mobility: Needs Assistance Bed Mobility: Supine to Sit     Supine to sit: Supervision;HOB elevated;Min assist     General bed mobility comments: Pt able to sit with supervision but required min assist for LLE to lower secondary to pain.    Transfers Overall transfer level: Needs assistance Equipment used: Rolling walker (2 wheeled) Transfers: Sit to/from Stand Sit to Stand: Min assist Stand pivot transfers: Min assist       General transfer comment: Pt performed with rocking motion from bed requiring increased time to stand and position herself from pushing from bed to placement of hand on RW.  No LOB but required min assist to boost.  Pt performed transfers from [facility] with min guard assist.    Ambulation/Gait Ambulation/Gait assistance: Min guard Ambulation Distance (Feet): 160 Feet Assistive device: Rolling walker (2 wheeled) Gait Pattern/deviations: Step-through pattern   Gait velocity interpretation: Below normal speed for age/gender General Gait Details: Pt required cues for sequencing and L heel strike, L extension and upper trunk control.  Pt with No LOB but required min guard for safety.     Stairs            Wheelchair Mobility    Modified Rankin  (Stroke Patients Only)       Balance Overall balance assessment: Needs assistance   Sitting balance-Leahy Scale: Good       Standing balance-Leahy Scale: Fair                      Cognition Arousal/Alertness: Awake/alert Behavior During Therapy: WFL for tasks assessed/performed Overall Cognitive Status: Within Functional Limits for tasks assessed                      Exercises Total Joint Exercises Goniometric ROM: 70 degrees    General Comments        Pertinent Vitals/Pain Pain Assessment: 0-10 Pain Score: 7  Pain Location: L knee Pain Descriptors / Indicators: Sore Pain Intervention(s): Limited activity within patient's tolerance;Repositioned;Ice applied    Home Living                      Prior Function            PT Goals (current goals can now be found in the care plan section) Acute Rehab PT Goals Patient Stated Goal: feel better Potential to Achieve Goals: Good Progress towards PT goals: Progressing toward goals    Frequency  7X/week    PT Plan Current plan remains appropriate    Co-evaluation             End of Session Equipment Utilized During Treatment: Gait belt Activity Tolerance: Patient tolerated treatment well Patient left: in chair;with call  bell/phone within reach     Time: 1041-1114 PT Time Calculation (min) (ACUTE ONLY): 33 min  Charges:  $Gait Training: 8-22 mins $Therapeutic Activity: 8-22 mins                    G Codes:      Cristela Blue 2016/02/15, 4:55 PM Governor Rooks, PTA pager [phone]

## 2016-02-09 ENCOUNTER — Encounter: Payer: Self-pay | Admitting: Adult Health

## 2016-02-09 ENCOUNTER — Non-Acute Institutional Stay (SKILLED_NURSING_FACILITY): Payer: Commercial Managed Care - HMO | Admitting: Adult Health

## 2016-02-09 DIAGNOSIS — M1712 Unilateral primary osteoarthritis, left knee: Secondary | ICD-10-CM

## 2016-02-09 DIAGNOSIS — I10 Essential (primary) hypertension: Secondary | ICD-10-CM

## 2016-02-09 DIAGNOSIS — D72829 Elevated white blood cell count, unspecified: Secondary | ICD-10-CM

## 2016-02-09 DIAGNOSIS — E039 Hypothyroidism, unspecified: Secondary | ICD-10-CM

## 2016-02-09 DIAGNOSIS — E782 Mixed hyperlipidemia: Secondary | ICD-10-CM | POA: Diagnosis not present

## 2016-02-09 DIAGNOSIS — B20 Human immunodeficiency virus [HIV] disease: Secondary | ICD-10-CM

## 2016-02-09 DIAGNOSIS — N183 Chronic kidney disease, stage 3 unspecified: Secondary | ICD-10-CM

## 2016-02-09 DIAGNOSIS — K5901 Slow transit constipation: Secondary | ICD-10-CM | POA: Diagnosis not present

## 2016-02-09 DIAGNOSIS — R2681 Unsteadiness on feet: Secondary | ICD-10-CM | POA: Diagnosis not present

## 2016-02-09 DIAGNOSIS — J309 Allergic rhinitis, unspecified: Secondary | ICD-10-CM | POA: Diagnosis not present

## 2016-02-09 NOTE — Progress Notes (Signed)
Patient ID: Kayla Price, female   DOB: [birth year], 60 y.o.   MRN: [id number]    DATE:  02/09/2016   MRN:  [id number]  BIRTHDAY: 04-03-1956  Facility:  Nursing Home Location:  [place] Room Number: 208-P  LEVEL OF CARE:  SNF [phone])  Contact Information    Name Relation Home Work [place] Sister [phone]  [phone]       Code Status History    Date Active Date Inactive Code Status Order ID Comments User Context   02/06/2016  6:12 PM 02/08/2016  6:59 PM Full Code [id number]  Leighton Parody, PA-C Inpatient       Chief Complaint  Patient presents with  . Hospitalization Follow-up    HISTORY OF PRESENT ILLNESS:  This is a 60 year old female who has been [facility] to [facility] 02/08/16 from [facility] withOsteoarthritis of left knee for which she had left total knee arthroplasty on 02/06/16.   She has been admitted for a short-term rehabilitation.  She is seen in her room today and complained of constipation. She has cold the orthopedic office and was concerned about her LLE Ace wrap. PA called back and verbalized that Ace wrap can be off for now.    PAST MEDICAL HISTORY:  Past Medical History:  Diagnosis Date  . Alopecia areata 11/28/2009  . Bell's palsy   . BREAST CANCER, HX OF 03/27/2006  . Cancer [facility]) 2005   Breast cancer   chemotherapy and radiation  . CKD (chronic kidney disease) stage 3, GFR 30-59 ml/min 06/08/2015  . Dry eye syndrome   . Fasting hyperglycemia   . HIP FRACTURE, RIGHT 05/06/2008  . HIV (human immunodeficiency virus infection) ([place])   . HIV DISEASE 03/27/2006  . HYPERLIPIDEMIA, MIXED 12/15/2007  . HYPERTENSION 03/27/2006  . 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 MENORRHAGIA, POSTMENOPAUSAL 02/03/2009  . Osteoarthritis of left knee 06/08/2015  . OSTEOARTHROSIS, LOCAL, SCND, UNSPC SITE 04/07/2007  . PVD 04/07/2007  . Tinea capitis   . TRIGGER FINGER 05/06/2008  . Unspecified  vitamin D deficiency 08/06/2007     CURRENT MEDICATIONS: Reviewed  Patient's Medications  New Prescriptions   No medications on file  Previous Medications   ASPIRIN EC 325 MG TABLET    Take 1 tablet (325 mg total) by mouth 2 (two) times daily.   DOCUSATE SODIUM (COLACE) 100 MG CAPSULE    Take 100 mg by mouth 2 (two) times daily.   EDURANT 25 MG TABS TABLET    TAKE 1 TABLET BY MOUTH EVERY DAY WITH BREAKFAST   LEVOTHYROXINE (SYNTHROID, LEVOTHROID) 150 MCG TABLET    TAKE 1 TABLET (150 MCG TOTAL) BY MOUTH DAILY BEFORE BREAKFAST.   LORATADINE (CLARITIN) 10 MG TABLET    Take 10 mg by mouth daily as needed for itching. Reported on 06/08/2015   OLMESARTAN-HYDROCHLOROTHIAZIDE (BENICAR HCT) 40-25 MG TABLET    Take 1 tablet by mouth daily.   OXYCODONE-ACETAMINOPHEN (ROXICET) 5-325 MG TABLET    Take 1 tablet by mouth every 4 (four) hours as needed.   PITAVASTATIN CALCIUM 4 MG TABS    Take 1 tablet (4 mg total) by mouth daily. This may be placebo, study provided. Do not dispense.   POLYETHYLENE GLYCOL (MIRALAX / GLYCOLAX) PACKET    Take 17 g by mouth daily as needed.   PREZCOBIX 800-150 MG TABLET    TAKE 1 TABLET BY MOUTH DAILY. SWALLOW WHOLE. DO NOT CRUSH, BREAK OR CHEW TABLETS.  TAKE WITH FOOD.   SENNOSIDES-DOCUSATE SODIUM (SENOKOT-S) 8.6-50 MG TABLET    Take 2 tablets by mouth 2 (two) times daily.   TIVICAY 50 MG TABLET    TAKE 1 TABLET BY MOUTH EVERY DAY   TIZANIDINE (ZANAFLEX) 2 MG TABLET    Take 1 tablet (2 mg total) by mouth every 6 (six) hours as needed for muscle spasms.  Modified Medications   No medications on file  Discontinued Medications   ASCORBIC ACID (VITAMIN C) 250 MG CHEW    Chew 500 mg by mouth daily.   B COMPLEX-C (SUPER B COMPLEX PO)    Take 1 tablet by mouth daily. Reported on 06/08/2015   CHOLECALCIFEROL (D3 ADULT PO)    Take 2,000 Units by mouth daily.   CO-ENZYME Q-10 30 MG CAPSULE    Take 30 mg by mouth 3 (three) times daily.   PROBIOTIC PRODUCT (PROBIOTIC COMPLEX  ACIDOPHILUS) CAPS    Take 1 capsule by mouth daily.   VITAMIN E PO    Take 1 tablet by mouth daily. 400 units     Allergies  Allergen Reactions  . Lisinopril Anaphylaxis and Swelling    Swelling of tongue and mouth     REVIEW OF SYSTEMS:  GENERAL: no change in appetite, no fatigue, no weight changes, no fever, chills or weakness EYES: Denies change in vision, dry eyes, eye pain, itching or discharge EARS: Denies change in hearing, ringing in ears, or earache NOSE: Denies nasal congestion or epistaxis MOUTH and THROAT: Denies oral discomfort, gingival pain or bleeding, pain from teeth or hoarseness   RESPIRATORY: no cough, SOB, DOE, wheezing, hemoptysis CARDIAC: no chest pain, edema or palpitations GI: no abdominal pain, diarrhea, heart burn, nausea or vomiting, +constipation GU: Denies dysuria, frequency, hematuria, incontinence, or discharge PSYCHIATRIC: Denies feeling of depression or anxiety. No report of hallucinations, insomnia, paranoia, or agitation     PHYSICAL EXAMINATION  GENERAL APPEARANCE: Well nourished. In no acute distress. Morbidly obese. SKIN:  Left knee surgical incision is covered with Aquacel dressing with Ace wrap HEAD: Normal in size and contour. No evidence of trauma EYES: Lids open and close normally. No blepharitis, entropion or ectropion. PERRL. Conjunctivae are clear and sclerae are white. Lenses are without opacity EARS: Pinnae are normal. Patient hears normal voice tunes of the examiner MOUTH and THROAT: Lips are without lesions. Oral mucosa is moist and without lesions. Tongue is normal in shape, size, and color and without lesions NECK: supple, trachea midline, no neck masses, no thyroid tenderness, no thyromegaly LYMPHATICS: no LAN in the neck, no supraclavicular LAN RESPIRATORY: breathing is even & unlabored, BS CTAB CARDIAC: RRR, no murmur,no extra heart sounds, no edema GI: abdomen soft, normal BS, no masses, no tenderness, no hepatomegaly,  no splenomegaly EXTREMITIES:  Able to move 4 extremities PSYCHIATRIC: Alert and oriented X 3. Affect and behavior are appropriate  LABS/RADIOLOGY: Labs reviewed: Basic Metabolic Panel:  Recent Labs  01/05/16 0925 01/27/16 1028 02/07/16 0620  NA 140 139 136  K 4.3 4.1 4.0  CL 102 105 100*  CO2 '29 28 27  '$ GLUCOSE 93 93 125*  BUN '21 18 10  '$ CREATININE 1.51* 1.33* 1.22*  CALCIUM 10.4 10.2 9.7   Liver Function Tests:  Recent Labs  11/10/15 1125 11/29/15 1040 01/05/16 0925  AST '20 24 16  '$ ALT '14 21 14  '$ ALKPHOS 51 56 65  BILITOT 0.8 0.4 0.6  PROT 7.4 7.7 7.4  ALBUMIN 3.9 4.2 4.0   CBC:  Recent  Labs  05/24/15 0959 11/10/15 1125 01/27/16 1028 02/07/16 0620 02/08/16 0606  WBC 5.3 4.7 4.6 9.8 12.0*  NEUTROABS 2.5 2,115 2.1  --   --   HGB 15.0 15.3 15.0 13.8 12.8  HCT 42.5 45.9* 45.6 41.3 38.6  MCV 92.0 94.6 98.1 96.9 96.0  PLT 209 200 190 164 164   Lipid Panel:  Recent Labs  05/24/15 0959 11/29/15 1040  HDL 55 57     Dg Chest 2 View  Result Date: 01/27/2016 CLINICAL DATA:  Preop for total knee arthroplasty. EXAM: CHEST  2 VIEW COMPARISON:  Radiographs of June 23, 2013. FINDINGS: The heart size and mediastinal contours are within normal limits. Both lungs are clear. No pneumothorax or pleural effusion is noted. The visualized skeletal structures are unremarkable. IMPRESSION: No active cardiopulmonary disease. Electronically Signed   By: Marijo Conception, M.D.   On: 01/27/2016 11:31    ASSESSMENT/PLAN:  Unsteady gait - for rehabilitation, PT and OT; fall precaution  Osteoarthritis of left knee S/P left total knee arthroplasty - for rehabilitation, PT and OT; continue aspirin EC 325 mg 1 tab by mouth twice a day for DVT prophylaxis; Percocet 5/325 mg 1 tab by mouth every 4 hours when necessary for pain; Zanaflex 2 mg 1 tab by mouth every 6 hours when necessary for muscle spasm; follow-up with orthopedic surgeon, Dr. Frederik Pear, in 2 weeks  Constipation -  start senna S - 8.6-50 mg give 2 tabs by mouth twice a day  Allergic rhinitis - continue loratadine 10 mg 1 tab by mouth daily when necessary  Hypothyroidism - continue Synthroid 150 g 1 tab by mouth daily; check TSH  Hypertension - continue Benicar HCT 40-25 mg 1 tab by mouth at lunch; check BMP  HIV - continue Edurant 25 mg 1 tab by mouth daily at bedtime, Prezcobix 800-150 mg 1 tab by mouth daily at bedtime and Tivicay 50 mg 1 tab by mouth daily at bedtime  Hyperlipidemia - continue Pitavastatin Calcium 4 mg 1 tab by mouth at lunch Lab Results  Component Value Date   CHOL 132 11/29/2015   HDL 57 11/29/2015   LDLCALC 58 11/29/2015   TRIG 87 11/29/2015   CHOLHDL 2.3 11/29/2015    Leukocytosis - no fever; re-check CBC Lab Results  Component Value Date   WBC 12.0 (H) 02/08/2016   Chronic kidney disease, stage III - will monitor Lab Results  Component Value Date   CREATININE 1.22 (H) 02/07/2016       Goals of care:  Short-term rehabilitation     Durenda Age, NP [place] [phone]

## 2016-02-10 ENCOUNTER — Non-Acute Institutional Stay (SKILLED_NURSING_FACILITY): Payer: Commercial Managed Care - HMO | Admitting: Internal Medicine

## 2016-02-10 ENCOUNTER — Encounter: Payer: Self-pay | Admitting: Internal Medicine

## 2016-02-10 DIAGNOSIS — N183 Chronic kidney disease, stage 3 unspecified: Secondary | ICD-10-CM

## 2016-02-10 DIAGNOSIS — E038 Other specified hypothyroidism: Secondary | ICD-10-CM

## 2016-02-10 DIAGNOSIS — E785 Hyperlipidemia, unspecified: Secondary | ICD-10-CM | POA: Diagnosis not present

## 2016-02-10 DIAGNOSIS — R2681 Unsteadiness on feet: Secondary | ICD-10-CM | POA: Diagnosis not present

## 2016-02-10 DIAGNOSIS — D72829 Elevated white blood cell count, unspecified: Secondary | ICD-10-CM | POA: Diagnosis not present

## 2016-02-10 DIAGNOSIS — B20 Human immunodeficiency virus [HIV] disease: Secondary | ICD-10-CM

## 2016-02-10 DIAGNOSIS — I10 Essential (primary) hypertension: Secondary | ICD-10-CM

## 2016-02-10 DIAGNOSIS — M1712 Unilateral primary osteoarthritis, left knee: Secondary | ICD-10-CM

## 2016-02-10 DIAGNOSIS — K5900 Constipation, unspecified: Secondary | ICD-10-CM | POA: Diagnosis not present

## 2016-02-10 NOTE — Progress Notes (Signed)
LOCATION: [place]  PCP: Lauree Chandler, NP   Code Status: Full Code  Goals of care: Advanced Directive information Advanced Directives 02/08/2016  Does patient have an advance directive? Yes  Type of Advance Directive Living will;Healthcare Power of Attorney  Does patient want to make changes to advanced directive? No - Patient declined  Copy of advanced directive(s) in chart? No - copy requested  Would patient like information on creating an advanced directive? -       Extended Emergency Contact Information Primary Emergency Contact: Duke,Mary Address: [address], [place] Phone: [phone] Mobile Phone: [phone] Relation: Sister   Allergies  Allergen Reactions  . Lisinopril Anaphylaxis and Swelling    Swelling of tongue and mouth    Chief Complaint  Patient presents with  . New Admit To SNF    New Admission     HPI:  Patient is a 60 y.o. female seen today for short term rehabilitation post hospital admission from 02/06/16-02/08/16 with left knee OA. She underwent left total knee arthroplasty. She is seen in her room today.   Review of Systems:  Constitutional: Negative for fever, chills, diaphoresis.  HENT: Negative for headache, congestion, nasal discharge, difficulty swallowing.   Eyes: Negative for blurred vision, double vision and discharge.  Respiratory: Negative for cough, shortness of breath and wheezing.   Cardiovascular: Negative for chest pain, palpitations, leg swelling.  Gastrointestinal: Negative for heartburn, nausea, vomiting, abdominal pain, loss of appetite Last bowel movement was this am. Genitourinary: Negative for dysuria and flank pain.  Musculoskeletal: Negative for back pain, fall in the facility.  Skin: Negative for itching, rash.  Neurological: Negative for dizziness. Psychiatric/Behavioral: Negative for depression   Past Medical History:  Diagnosis Date  .  Alopecia areata 11/28/2009  . Bell's palsy   . BREAST CANCER, HX OF 03/27/2006  . Cancer [facility]) 2005   Breast cancer   chemotherapy and radiation  . CKD (chronic kidney disease) stage 3, GFR 30-59 ml/min 06/08/2015  . Dry eye syndrome   . Fasting hyperglycemia   . HIP FRACTURE, RIGHT 05/06/2008  . HIV (human immunodeficiency virus infection) ([place])   . HIV DISEASE 03/27/2006  . HYPERLIPIDEMIA, MIXED 12/15/2007  . HYPERTENSION 03/27/2006  . 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 MENORRHAGIA, POSTMENOPAUSAL 02/03/2009  . Osteoarthritis of left knee 06/08/2015  . OSTEOARTHROSIS, LOCAL, SCND, UNSPC SITE 04/07/2007  . PVD 04/07/2007  . Tinea capitis   . TRIGGER FINGER 05/06/2008  . Unspecified vitamin D deficiency 08/06/2007   Past Surgical History:  Procedure Laterality Date  . BREAST LUMPECTOMY    . COLONOSCOPY    . HYSTEROSCOPY  2006  . LYMPH NODE DISSECTION  2005  . placement   of port-a-cath    . PORT-A-CATH REMOVAL  2006   insertion 2005  . removal of port a cath  12/2014  . THYROID SURGERY  2009   Ablation   . TOTAL KNEE ARTHROPLASTY Left 02/06/2016   Procedure: TOTAL KNEE ARTHROPLASTY;  Surgeon: Frederik Pear, MD;  Location: [place];  Service: Orthopedics;  Laterality: Left;  . TUBAL LIGATION     Social History:   reports that she has never smoked. She has never used smokeless tobacco. She reports that she does not drink alcohol or use drugs.  Family History  Problem Relation Age of Onset  . Heart attack Brother     Massive MI in 102s  .  Stroke Brother     CAD  . Kidney disease Mother   . Stroke Mother   . Diabetes Mother   . Liver disease Sister   . COPD Sister     had I-131 rx of hyperthyroidism  . Diabetes Sister   . Stroke Sister   . Cancer Paternal Uncle   . Cancer Cousin   . Heart failure Father   . Heart disease Father   . Arthritis Father   . Sarcoidosis Sister     Medications:   Medication List       Accurate as of 02/10/16 12:41 PM. Always use your  most recent med list.          aspirin EC 325 MG tablet Take 1 tablet (325 mg total) by mouth 2 (two) times daily.   docusate sodium 100 MG capsule Commonly known as:  COLACE Take 100 mg by mouth 2 (two) times daily.   EDURANT 25 MG Tabs tablet Generic drug:  rilpivirine TAKE 1 TABLET BY MOUTH EVERY DAY WITH BREAKFAST   levothyroxine 150 MCG tablet Commonly known as:  SYNTHROID, LEVOTHROID TAKE 1 TABLET (150 MCG TOTAL) BY MOUTH DAILY BEFORE BREAKFAST.   loratadine 10 MG tablet Commonly known as:  CLARITIN Take 10 mg by mouth daily as needed for itching. Reported on 06/08/2015   olmesartan-hydrochlorothiazide 40-25 MG tablet Commonly known as:  BENICAR HCT Take 1 tablet by mouth daily.   oxyCODONE-acetaminophen 5-325 MG tablet Commonly known as:  ROXICET Take 1 tablet by mouth every 4 (four) hours as needed.   Pitavastatin Calcium 4 MG Tabs Take 1 tablet (4 mg total) by mouth daily. This may be placebo, study provided. Do not dispense.   polyethylene glycol packet Commonly known as:  MIRALAX / GLYCOLAX Take 17 g by mouth daily as needed.   PREZCOBIX 800-150 MG tablet Generic drug:  darunavir-cobicistat TAKE 1 TABLET BY MOUTH DAILY. SWALLOW WHOLE. DO NOT CRUSH, BREAK OR CHEW TABLETS. TAKE WITH FOOD.   sennosides-docusate sodium 8.6-50 MG tablet Commonly known as:  SENOKOT-S Take 2 tablets by mouth 2 (two) times daily.   TIVICAY 50 MG tablet Generic drug:  dolutegravir TAKE 1 TABLET BY MOUTH EVERY DAY   tiZANidine 2 MG tablet Commonly known as:  ZANAFLEX Take 1 tablet (2 mg total) by mouth every 6 (six) hours as needed for muscle spasms.       Immunizations: Immunization History  Administered Date(s) Administered  . Hepatitis B 07/11/1992, 11/17/2001, 03/02/2002, 04/09/2011  . Influenza Split 04/10/2011, 03/05/2012  . Influenza Whole 03/27/2006, 04/07/2007, 05/06/2008, 05/16/2009, 09/27/2010  . Influenza,inj,Quad PF,36+ Mos 03/09/2013, 04/23/2014  .  Influenza-Unspecified 06/08/2015  . Pneumococcal Polysaccharide-23 03/27/2006, 02/27/2012     Physical Exam:  Vitals:   02/10/16 1236  BP: 110/76  Pulse: 77  Resp: 20  Temp: 97.8 F (36.6 C)  TempSrc: Oral  Weight: 259 lb (117.5 kg)  Height: '5\' 7"'$  (1.702 m)   Body mass index is 40.57 kg/m.  General- elderly female, morbidly obese, in no acute distress Head- normocephalic, atraumatic Nose- no nasal discharge Throat- moist mucus membrane Eyes- PERRLA, EOMI, no pallor, no icterus, no discharge, normal conjunctiva, normal sclera Neck- no cervical lymphadenopathy Cardiovascular- normal s1,s2, no murmur Respiratory- bilateral clear to auscultation, no wheeze, no rhonchi, no crackles, no use of accessory muscles Abdomen- bowel sounds present, soft, non tender Musculoskeletal- able to move all 4 extremities, generalized weakness, limited ROM to left knee Neurological- alert and oriented to person, place and time Skin-  warm and dry, left knee surgical incision with aquacel dressing Psychiatry- normal mood and affect    Labs reviewed: Basic Metabolic Panel:  Recent Labs  01/05/16 0925 01/27/16 1028 02/07/16 0620  NA 140 139 136  K 4.3 4.1 4.0  CL 102 105 100*  CO2 '29 28 27  '$ GLUCOSE 93 93 125*  BUN '21 18 10  '$ CREATININE 1.51* 1.33* 1.22*  CALCIUM 10.4 10.2 9.7   Liver Function Tests:  Recent Labs  11/10/15 1125 11/29/15 1040 01/05/16 0925  AST '20 24 16  '$ ALT '14 21 14  '$ ALKPHOS 51 56 65  BILITOT 0.8 0.4 0.6  PROT 7.4 7.7 7.4  ALBUMIN 3.9 4.2 4.0   No results for input(s): LIPASE, AMYLASE in the last 8760 hours. No results for input(s): AMMONIA in the last 8760 hours. CBC:  Recent Labs  05/24/15 0959 11/10/15 1125 01/27/16 1028 02/07/16 0620 02/08/16 0606  WBC 5.3 4.7 4.6 9.8 12.0*  NEUTROABS 2.5 2,115 2.1  --   --   HGB 15.0 15.3 15.0 13.8 12.8  HCT 42.5 45.9* 45.6 41.3 38.6  MCV 92.0 94.6 98.1 96.9 96.0  PLT 209 200 190 164 164    Radiological  Exams: Dg Chest 2 View  Result Date: 01/27/2016 CLINICAL DATA:  Preop for total knee arthroplasty. EXAM: CHEST  2 VIEW COMPARISON:  Radiographs of June 23, 2013. FINDINGS: The heart size and mediastinal contours are within normal limits. Both lungs are clear. No pneumothorax or pleural effusion is noted. The visualized skeletal structures are unremarkable. IMPRESSION: No active cardiopulmonary disease. Electronically Signed   By: Marijo Conception, M.D.   On: 01/27/2016 11:31     Assessment/Plan  Unsteady gait Post left knee surgery. Will have her work with physical therapy and occupational therapy team to help with gait training and muscle strengthening exercises.fall precautions. Skin care. Encourage to be out of bed.   Left knee OA S/p left knee arthroplasty. Has follow up with orthopedics. Will have her work with physical therapy and occupational therapy team to help with gait training and muscle strengthening exercises.fall precautions. Skin care. Encourage to be out of bed. Continue oxycodone-APAP 5-325 mg q4h prn pain and zanaflex 2 mg q6h prn muscle spasm. Continue aspirin 325 mg bid for dvt prophylaxis.   Constipation On senokot s 2 tab bid, colace 100 mg bid with miralax daily as needed. Has loose stool now. D/c senokot s and continue colace with miralax as above. Monitor  Leukocytosis Afebrile, monitor cbc  ckd stage 3 Monitor bmp  HTN Monitor bp reading and bmp, continue benicar-hctz 40-25 mg daily  HLD Continue her statin  HIV Continue edurant, prezcobix and tivicay  Hypothyroidism Continue levothyroxine 150 mcg daily Lab Results  Component Value Date   TSH 1.58 07/06/2015      Goals of care: short term rehabilitation   Labs/tests ordered: cbc, bmp, tsh  Family/ staff Communication: reviewed care plan with patient and nursing supervisor    Blanchie Serve, MD Internal Medicine [place], [place] [address] Cell Phone (Monday-Friday 8 am - 5 pm): [phone] On Call: [phone] and follow prompts after 5 pm and on weekends Office Phone: [phone] Office Fax: [phone]

## 2016-02-13 LAB — BASIC METABOLIC PANEL
BUN: 13 mg/dL (ref 4–21)
Creatinine: 1.2 mg/dL — AB (ref 0.5–1.1)
GLUCOSE: 104 mg/dL
Potassium: 4 mmol/L (ref 3.4–5.3)
Sodium: 140 mmol/L (ref 137–147)

## 2016-02-13 LAB — CBC AND DIFFERENTIAL
HEMATOCRIT: 36 % (ref 36–46)
HEMOGLOBIN: 11.6 g/dL — AB (ref 12.0–16.0)
PLATELETS: 237 10*3/uL (ref 150–399)
WBC: 6.5 10*3/mL

## 2016-02-13 LAB — TSH: TSH: 8.91 u[IU]/mL — AB (ref 0.41–5.90)

## 2016-02-16 ENCOUNTER — Encounter: Payer: Self-pay | Admitting: Adult Health

## 2016-02-16 ENCOUNTER — Non-Acute Institutional Stay (SKILLED_NURSING_FACILITY): Payer: Commercial Managed Care - HMO | Admitting: Adult Health

## 2016-02-16 DIAGNOSIS — K5901 Slow transit constipation: Secondary | ICD-10-CM

## 2016-02-16 DIAGNOSIS — M1712 Unilateral primary osteoarthritis, left knee: Secondary | ICD-10-CM

## 2016-02-16 DIAGNOSIS — I10 Essential (primary) hypertension: Secondary | ICD-10-CM | POA: Diagnosis not present

## 2016-02-16 DIAGNOSIS — E782 Mixed hyperlipidemia: Secondary | ICD-10-CM | POA: Diagnosis not present

## 2016-02-16 DIAGNOSIS — R2681 Unsteadiness on feet: Secondary | ICD-10-CM

## 2016-02-16 DIAGNOSIS — N183 Chronic kidney disease, stage 3 unspecified: Secondary | ICD-10-CM

## 2016-02-16 DIAGNOSIS — J309 Allergic rhinitis, unspecified: Secondary | ICD-10-CM | POA: Diagnosis not present

## 2016-02-16 DIAGNOSIS — B20 Human immunodeficiency virus [HIV] disease: Secondary | ICD-10-CM | POA: Diagnosis not present

## 2016-02-16 DIAGNOSIS — E039 Hypothyroidism, unspecified: Secondary | ICD-10-CM

## 2016-02-16 NOTE — Progress Notes (Signed)
Patient ID: Kayla Price, female   DOB: 09-15-1955, 60 y.o.   MRN: [id number]    DATE:  02/16/16  MRN:  [id number]  BIRTHDAY: 14-Aug-1955  Facility:  [facility] Location:  [place] Room Number: 208-P  LEVEL OF CARE:  SNF [phone])  Contact Information    Name Relation Home Work [place] Sister [phone]  [phone]   [facility] Daughter   [phone]   [place], [place]   [phone]       Code Status History    Date Active Date Inactive Code Status Order ID Comments User Context   02/06/2016  6:12 PM 02/08/2016  6:59 PM Full Code [id number]  Leighton Parody, PA-C Inpatient       Chief Complaint  Patient presents with  . Discharge Note    HISTORY OF PRESENT ILLNESS:  This is a 60 year old female who is for discharge home with medications and Home health PT and OT. DME:  Rolling walker and bedside commode.  She has been admitted to [facility] on 02/08/16 from [facility] withOsteoarthritis of left knee for which she had left total knee arthroplasty on 02/06/16.   Latest tsh  8.905, high and she is currently taking Synthroid.  Patient was admitted to this facility for short-term rehabilitation after the patient's recent hospitalization.  Patient has completed SNF rehabilitation and therapy has cleared the patient for discharge.  PAST MEDICAL HISTORY:  Past Medical History:  Diagnosis Date  . Alopecia areata 11/28/2009  . Bell's palsy   . BREAST CANCER, HX OF 03/27/2006  . Cancer [facility]) 2005   Breast cancer   chemotherapy and radiation  . CKD (chronic kidney disease) stage 3, GFR 30-59 ml/min 06/08/2015  . Dry eye syndrome   . Fasting hyperglycemia   . HIP FRACTURE, RIGHT 05/06/2008  . HIV (human immunodeficiency virus infection) ([place])   . HIV DISEASE 03/27/2006  . HYPERLIPIDEMIA, MIXED 12/15/2007  . HYPERTENSION 03/27/2006  . 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 MENORRHAGIA, POSTMENOPAUSAL 02/03/2009  .  Osteoarthritis of left knee 06/08/2015  . OSTEOARTHROSIS, LOCAL, SCND, UNSPC SITE 04/07/2007  . PVD 04/07/2007  . Tinea capitis   . TRIGGER FINGER 05/06/2008  . Unspecified vitamin D deficiency 08/06/2007     CURRENT MEDICATIONS: Reviewed  Patient's Medications  New Prescriptions   No medications on file  Previous Medications   ASPIRIN EC 325 MG TABLET    Take 1 tablet (325 mg total) by mouth 2 (two) times daily.   DOCUSATE SODIUM (COLACE) 100 MG CAPSULE    Take 100 mg by mouth 2 (two) times daily.    EDURANT 25 MG TABS TABLET    TAKE 1 TABLET BY MOUTH EVERY DAY WITH BREAKFAST   LEVOTHYROXINE (SYNTHROID, LEVOTHROID) 175 MCG TABLET    Take 175 mcg by mouth daily before breakfast.   LORATADINE (CLARITIN) 10 MG TABLET    Take 10 mg by mouth daily as needed for itching. Reported on 06/08/2015   OLMESARTAN-HYDROCHLOROTHIAZIDE (BENICAR HCT) 40-25 MG TABLET    Take 1 tablet by mouth daily.   OXYCODONE-ACETAMINOPHEN (ROXICET) 5-325 MG TABLET    Take 1 tablet by mouth every 4 (four) hours as needed.   PITAVASTATIN CALCIUM 4 MG TABS    Take 1 tablet (4 mg total) by mouth daily. This may be placebo, study provided. Do not dispense.   POLYETHYLENE GLYCOL (MIRALAX / GLYCOLAX) PACKET    Take 17 g by mouth daily as needed.  PREZCOBIX 800-150 MG TABLET    TAKE 1 TABLET BY MOUTH DAILY. SWALLOW WHOLE. DO NOT CRUSH, BREAK OR CHEW TABLETS. TAKE WITH FOOD.   TIVICAY 50 MG TABLET    TAKE 1 TABLET BY MOUTH EVERY DAY   TIZANIDINE (ZANAFLEX) 2 MG TABLET    Take 1 tablet (2 mg total) by mouth every 6 (six) hours as needed for muscle spasms.  Modified Medications   No medications on file  Discontinued Medications   LEVOTHYROXINE (SYNTHROID, LEVOTHROID) 150 MCG TABLET    TAKE 1 TABLET (150 MCG TOTAL) BY MOUTH DAILY BEFORE BREAKFAST.   SENNOSIDES-DOCUSATE SODIUM (SENOKOT-S) 8.6-50 MG TABLET    Take 2 tablets by mouth 2 (two) times daily.     Allergies  Allergen Reactions  . Lisinopril Anaphylaxis and Swelling     Swelling of tongue and mouth     REVIEW OF SYSTEMS:  GENERAL: no change in appetite, no fatigue, no weight changes, no fever, chills or weakness EYES: Denies change in vision, dry eyes, eye pain, itching or discharge EARS: Denies change in hearing, ringing in ears, or earache NOSE: Denies nasal congestion or epistaxis MOUTH and THROAT: Denies oral discomfort, gingival pain or bleeding, pain from teeth or hoarseness   RESPIRATORY: no cough, SOB, DOE, wheezing, hemoptysis CARDIAC: no chest pain, edema or palpitations GI: no abdominal pain, diarrhea, heart burn, nausea or vomiting, +constipation GU: Denies dysuria, frequency, hematuria, incontinence, or discharge PSYCHIATRIC: Denies feeling of depression or anxiety. No report of hallucinations, insomnia, paranoia, or agitation     PHYSICAL EXAMINATION  GENERAL APPEARANCE: Well nourished. In no acute distress. Morbidly obese. SKIN:  Left knee surgical incision is dry, no erythema HEAD: Normal in size and contour. No evidence of trauma EYES: Lids open and close normally. No blepharitis, entropion or ectropion. PERRL. Conjunctivae are clear and sclerae are white. Lenses are without opacity EARS: Pinnae are normal. Patient hears normal voice tunes of the examiner MOUTH and THROAT: Lips are without lesions. Oral mucosa is moist and without lesions. Tongue is normal in shape, size, and color and without lesions NECK: supple, trachea midline, no neck masses, no thyroid tenderness, no thyromegaly LYMPHATICS: no LAN in the neck, no supraclavicular LAN RESPIRATORY: breathing is even & unlabored, BS CTAB CARDIAC: RRR, no murmur,no extra heart sounds, no edema GI: abdomen soft, normal BS, no masses, no tenderness, no hepatomegaly, no splenomegaly EXTREMITIES:  Able to move 4 extremities PSYCHIATRIC: Alert and oriented X 3. Affect and behavior are appropriate   LABS/RADIOLOGY: Labs reviewed: Basic Metabolic Panel:  Recent Labs   01/05/16 0925 01/27/16 1028 02/07/16 0620 02/13/16 1358  NA 140 139 136 140  K 4.3 4.1 4.0 4.0  CL 102 105 100*  --   CO2 '29 28 27  '$ --   GLUCOSE 93 93 125*  --   BUN '21 18 10 13  '$ CREATININE 1.51* 1.33* 1.22* 1.2*  CALCIUM 10.4 10.2 9.7  --    Liver Function Tests:  Recent Labs  11/10/15 1125 11/29/15 1040 01/05/16 0925  AST '20 24 16  '$ ALT '14 21 14  '$ ALKPHOS 51 56 65  BILITOT 0.8 0.4 0.6  PROT 7.4 7.7 7.4  ALBUMIN 3.9 4.2 4.0   CBC:  Recent Labs  05/24/15 0959 11/10/15 1125 01/27/16 1028 02/07/16 0620 02/08/16 0606 02/13/16 1358  WBC 5.3 4.7 4.6 9.8 12.0* 6.5  NEUTROABS 2.5 2,115 2.1  --   --   --   HGB 15.0 15.3 15.0 13.8 12.8 11.6*  HCT  42.5 45.9* 45.6 41.3 38.6 36  MCV 92.0 94.6 98.1 96.9 96.0  --   PLT 209 200 190 164 164 237   Lipid Panel:  Recent Labs  05/24/15 0959 11/29/15 1040  HDL 55 57     Dg Chest 2 View  Result Date: 01/27/2016 CLINICAL DATA:  Preop for total knee arthroplasty. EXAM: CHEST  2 VIEW COMPARISON:  Radiographs of June 23, 2013. FINDINGS: The heart size and mediastinal contours are within normal limits. Both lungs are clear. No pneumothorax or pleural effusion is noted. The visualized skeletal structures are unremarkable. IMPRESSION: No active cardiopulmonary disease. Electronically Signed   By: Marijo Conception, M.D.   On: 01/27/2016 11:31    ASSESSMENT/PLAN:  Unsteady gait - for Home health PT and OT for therapeutic strengthening exercises; fall precaution  Osteoarthritis of left knee S/P left total knee arthroplasty - for Home health PT and OT for therapeutic strengthening exercises; continue aspirin EC 325 mg 1 tab by mouth twice a day till 02/22/16 for DVT prophylaxis; Percocet 5/325 mg 1 tab by mouth every 4 hours when necessary for pain; Zanaflex 2 mg 1 tab by mouth every 6 hours when necessary for muscle spasm; follow-up with orthopedic surgeon, Dr. Frederik Pear  Constipation - Continue Colace 100 mg 1 capsule by mouth  twice a day and MiraLAX 17 g by mouth daily when necessary   Allergic rhinitis - continue loratadine 10 mg 1 tab by mouth daily when necessary  Hypothyroidism -  discontinue Synthroid 150 g and start Synthroid 175 g 1 tab by mouth daily; check TSH in 6 weeks Lab Results  Component Value Date   TSH 8.91 (A) 02/13/2016   Hypertension - well-controlled; continue Benicar HCT 40-25 mg 1 tab by mouth at lunch  HIV - continue Edurant 25 mg 1 tab by mouth daily at bedtime, Prezcobix 800-150 mg 1 tab by mouth daily at bedtime and Tivicay 50 mg 1 tab by mouth daily at bedtime  Hyperlipidemia - continue Pitavastatin Calcium 4 mg 1 tab by mouth at lunch Lab Results  Component Value Date   CHOL 132 11/29/2015   HDL 57 11/29/2015   LDLCALC 58 11/29/2015   TRIG 87 11/29/2015   CHOLHDL 2.3 11/29/2015    Leukocytosis - resolved Lab Results  Component Value Date   WBC 6.5 02/13/2016   Chronic kidney disease, stage III - stable Lab Results  Component Value Date   CREATININE 1.2 (A) 02/13/2016        I have filled out patient's discharge paperwork and written prescriptions.  Patient will receive home health PT and OT.  DME provided:  Rolling walker and bedside commode    Total discharge time: Greater than 30 minutes Greater than 50% was spent in counseling and coordination of care with the patient.    Discharge time involved coordination of the discharge process with social worker, nursing staff and therapy department. Medical justification for home health services/DME verified.    Durenda Age, NP [employer] [phone]

## 2016-02-21 DIAGNOSIS — B20 Human immunodeficiency virus [HIV] disease: Secondary | ICD-10-CM | POA: Diagnosis not present

## 2016-02-21 DIAGNOSIS — N183 Chronic kidney disease, stage 3 (moderate): Secondary | ICD-10-CM | POA: Diagnosis not present

## 2016-02-21 DIAGNOSIS — Z471 Aftercare following joint replacement surgery: Secondary | ICD-10-CM | POA: Diagnosis not present

## 2016-02-21 DIAGNOSIS — I129 Hypertensive chronic kidney disease with stage 1 through stage 4 chronic kidney disease, or unspecified chronic kidney disease: Secondary | ICD-10-CM | POA: Diagnosis not present

## 2016-02-21 DIAGNOSIS — M1712 Unilateral primary osteoarthritis, left knee: Secondary | ICD-10-CM | POA: Diagnosis not present

## 2016-02-21 DIAGNOSIS — I739 Peripheral vascular disease, unspecified: Secondary | ICD-10-CM | POA: Diagnosis not present

## 2016-02-23 DIAGNOSIS — B20 Human immunodeficiency virus [HIV] disease: Secondary | ICD-10-CM | POA: Diagnosis not present

## 2016-02-23 DIAGNOSIS — N183 Chronic kidney disease, stage 3 (moderate): Secondary | ICD-10-CM | POA: Diagnosis not present

## 2016-02-23 DIAGNOSIS — I129 Hypertensive chronic kidney disease with stage 1 through stage 4 chronic kidney disease, or unspecified chronic kidney disease: Secondary | ICD-10-CM | POA: Diagnosis not present

## 2016-02-23 DIAGNOSIS — I739 Peripheral vascular disease, unspecified: Secondary | ICD-10-CM | POA: Diagnosis not present

## 2016-02-23 DIAGNOSIS — Z471 Aftercare following joint replacement surgery: Secondary | ICD-10-CM | POA: Diagnosis not present

## 2016-02-24 DIAGNOSIS — B20 Human immunodeficiency virus [HIV] disease: Secondary | ICD-10-CM | POA: Diagnosis not present

## 2016-02-24 DIAGNOSIS — Z471 Aftercare following joint replacement surgery: Secondary | ICD-10-CM | POA: Diagnosis not present

## 2016-02-24 DIAGNOSIS — I129 Hypertensive chronic kidney disease with stage 1 through stage 4 chronic kidney disease, or unspecified chronic kidney disease: Secondary | ICD-10-CM | POA: Diagnosis not present

## 2016-02-24 DIAGNOSIS — I739 Peripheral vascular disease, unspecified: Secondary | ICD-10-CM | POA: Diagnosis not present

## 2016-02-24 DIAGNOSIS — N183 Chronic kidney disease, stage 3 (moderate): Secondary | ICD-10-CM | POA: Diagnosis not present

## 2016-02-28 DIAGNOSIS — B20 Human immunodeficiency virus [HIV] disease: Secondary | ICD-10-CM | POA: Diagnosis not present

## 2016-02-28 DIAGNOSIS — I739 Peripheral vascular disease, unspecified: Secondary | ICD-10-CM | POA: Diagnosis not present

## 2016-02-28 DIAGNOSIS — Z471 Aftercare following joint replacement surgery: Secondary | ICD-10-CM | POA: Diagnosis not present

## 2016-02-28 DIAGNOSIS — I129 Hypertensive chronic kidney disease with stage 1 through stage 4 chronic kidney disease, or unspecified chronic kidney disease: Secondary | ICD-10-CM | POA: Diagnosis not present

## 2016-02-28 DIAGNOSIS — N183 Chronic kidney disease, stage 3 (moderate): Secondary | ICD-10-CM | POA: Diagnosis not present

## 2016-02-29 DIAGNOSIS — Z471 Aftercare following joint replacement surgery: Secondary | ICD-10-CM | POA: Diagnosis not present

## 2016-02-29 DIAGNOSIS — I739 Peripheral vascular disease, unspecified: Secondary | ICD-10-CM | POA: Diagnosis not present

## 2016-02-29 DIAGNOSIS — N183 Chronic kidney disease, stage 3 (moderate): Secondary | ICD-10-CM | POA: Diagnosis not present

## 2016-02-29 DIAGNOSIS — B20 Human immunodeficiency virus [HIV] disease: Secondary | ICD-10-CM | POA: Diagnosis not present

## 2016-02-29 DIAGNOSIS — I129 Hypertensive chronic kidney disease with stage 1 through stage 4 chronic kidney disease, or unspecified chronic kidney disease: Secondary | ICD-10-CM | POA: Diagnosis not present

## 2016-03-02 DIAGNOSIS — I739 Peripheral vascular disease, unspecified: Secondary | ICD-10-CM | POA: Diagnosis not present

## 2016-03-02 DIAGNOSIS — B20 Human immunodeficiency virus [HIV] disease: Secondary | ICD-10-CM | POA: Diagnosis not present

## 2016-03-02 DIAGNOSIS — N183 Chronic kidney disease, stage 3 (moderate): Secondary | ICD-10-CM | POA: Diagnosis not present

## 2016-03-02 DIAGNOSIS — Z471 Aftercare following joint replacement surgery: Secondary | ICD-10-CM | POA: Diagnosis not present

## 2016-03-02 DIAGNOSIS — I129 Hypertensive chronic kidney disease with stage 1 through stage 4 chronic kidney disease, or unspecified chronic kidney disease: Secondary | ICD-10-CM | POA: Diagnosis not present

## 2016-03-05 DIAGNOSIS — B20 Human immunodeficiency virus [HIV] disease: Secondary | ICD-10-CM | POA: Diagnosis not present

## 2016-03-05 DIAGNOSIS — Z471 Aftercare following joint replacement surgery: Secondary | ICD-10-CM | POA: Diagnosis not present

## 2016-03-05 DIAGNOSIS — N183 Chronic kidney disease, stage 3 (moderate): Secondary | ICD-10-CM | POA: Diagnosis not present

## 2016-03-05 DIAGNOSIS — I129 Hypertensive chronic kidney disease with stage 1 through stage 4 chronic kidney disease, or unspecified chronic kidney disease: Secondary | ICD-10-CM | POA: Diagnosis not present

## 2016-03-05 DIAGNOSIS — I739 Peripheral vascular disease, unspecified: Secondary | ICD-10-CM | POA: Diagnosis not present

## 2016-03-07 ENCOUNTER — Ambulatory Visit (INDEPENDENT_AMBULATORY_CARE_PROVIDER_SITE_OTHER): Payer: Commercial Managed Care - HMO | Admitting: Endocrinology

## 2016-03-07 ENCOUNTER — Encounter: Payer: Self-pay | Admitting: Endocrinology

## 2016-03-07 ENCOUNTER — Telehealth: Payer: Self-pay | Admitting: *Deleted

## 2016-03-07 VITALS — BP 134/86 | HR 65 | Ht 66.0 in | Wt 253.0 lb

## 2016-03-07 DIAGNOSIS — Z471 Aftercare following joint replacement surgery: Secondary | ICD-10-CM | POA: Diagnosis not present

## 2016-03-07 DIAGNOSIS — B20 Human immunodeficiency virus [HIV] disease: Secondary | ICD-10-CM | POA: Diagnosis not present

## 2016-03-07 DIAGNOSIS — E890 Postprocedural hypothyroidism: Secondary | ICD-10-CM | POA: Diagnosis not present

## 2016-03-07 DIAGNOSIS — N183 Chronic kidney disease, stage 3 (moderate): Secondary | ICD-10-CM | POA: Diagnosis not present

## 2016-03-07 DIAGNOSIS — I739 Peripheral vascular disease, unspecified: Secondary | ICD-10-CM | POA: Diagnosis not present

## 2016-03-07 DIAGNOSIS — I129 Hypertensive chronic kidney disease with stage 1 through stage 4 chronic kidney disease, or unspecified chronic kidney disease: Secondary | ICD-10-CM | POA: Diagnosis not present

## 2016-03-07 LAB — TSH: TSH: 3.64 u[IU]/mL (ref 0.35–4.50)

## 2016-03-07 NOTE — Progress Notes (Signed)
Subjective:    Patient ID: Kayla Price, female    DOB: 1956-02-15, 60 y.o.   MRN: [id number]  HPI Pt returns for f/u of post-RAI hypothyroidism (she had RAI for hyperthyroidism due to [place] in 2009, and now takes synthroid).  In August of 2017, she was noted to have high TSH, and Synthroid was increased to 175 mcg/d.  She says she never misses it.  She says she takes synthroid as rx'ed.  pt states she feels better in general, since recent knee replacement.   Past Medical History:  Diagnosis Date  . Alopecia areata 11/28/2009  . Bell's palsy   . BREAST CANCER, HX OF 03/27/2006  . Cancer [facility]) 2005   Breast cancer   chemotherapy and radiation  . CKD (chronic kidney disease) stage 3, GFR 30-59 ml/min 06/08/2015  . Dry eye syndrome   . Fasting hyperglycemia   . HIP FRACTURE, RIGHT 05/06/2008  . HIV (human immunodeficiency virus infection) ([place])   . HIV DISEASE 03/27/2006  . HYPERLIPIDEMIA, MIXED 12/15/2007  . HYPERTENSION 03/27/2006  . 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 MENORRHAGIA, POSTMENOPAUSAL 02/03/2009  . Osteoarthritis of left knee 06/08/2015  . OSTEOARTHROSIS, LOCAL, SCND, UNSPC SITE 04/07/2007  . PVD 04/07/2007  . Tinea capitis   . TRIGGER FINGER 05/06/2008  . Unspecified vitamin D deficiency 08/06/2007    Past Surgical History:  Procedure Laterality Date  . BREAST LUMPECTOMY    . COLONOSCOPY    . HYSTEROSCOPY  2006  . LYMPH NODE DISSECTION  2005  . placement   of port-a-cath    . PORT-A-CATH REMOVAL  2006   insertion 2005  . removal of port a cath  12/2014  . THYROID SURGERY  2009   Ablation   . TOTAL KNEE ARTHROPLASTY Left 02/06/2016   Procedure: TOTAL KNEE ARTHROPLASTY;  Surgeon: Frederik Pear, MD;  Location: [place];  Service: Orthopedics;  Laterality: Left;  . TUBAL LIGATION      Social History   Social History  . Marital status: Widowed    Spouse name: N/A  . Number of children: N/A  . Years of education: N/A   Occupational History  . Postal  Worker Unemployed   Social History Main Topics  . Smoking status: Never Smoker  . Smokeless tobacco: Never Used  . Alcohol use No  . Drug use: No  . Sexual activity: Not Currently     Comment: declined condoms   Other Topics Concern  . Not on file   Social History Narrative   Single/widow   Diet: good   Do you drink/eat things with caffeine? Tea occasionally   Marital status: Widowed  What year were you married? 1993   Do you live in a house, apartment,assisted living, condo,trailer,ect.)? House   Is it one or more stories? Two   How many persons live in your home? 4   Do you have any pets in you home? No   Current or past profession: Tour manager   Do you exercise? Yes  Type&how often: Stationary Bike, Water Exercise 3-4 x week   Do you have a living will? Yes   Do you have a DNR form? No  If not do you what one?   Do you have signed POA/HPOA forms? No   If so, please bring to your appointment.                   Current Outpatient Prescriptions on File Prior to Visit  Medication Sig Dispense  Refill  . EDURANT 25 MG TABS tablet TAKE 1 TABLET BY MOUTH EVERY DAY WITH BREAKFAST 30 tablet 11  . levothyroxine (SYNTHROID, LEVOTHROID) 175 MCG tablet Take 175 mcg by mouth daily before breakfast.    . loratadine (CLARITIN) 10 MG tablet Take 10 mg by mouth daily as needed for itching. Reported on 06/08/2015    . olmesartan-hydrochlorothiazide (BENICAR HCT) 40-25 MG tablet Take 1 tablet by mouth daily. 90 tablet 1  . Pitavastatin Calcium 4 MG TABS Take 1 tablet (4 mg total) by mouth daily. This may be placebo, study provided. Do not dispense. 30 tablet 11  . PREZCOBIX 800-150 MG tablet TAKE 1 TABLET BY MOUTH DAILY. SWALLOW WHOLE. DO NOT CRUSH, BREAK OR CHEW TABLETS. TAKE WITH FOOD. 30 tablet 11  . TIVICAY 50 MG tablet TAKE 1 TABLET BY MOUTH EVERY DAY 30 tablet 11  . tiZANidine (ZANAFLEX) 2 MG tablet Take 1 tablet (2 mg total) by mouth every 6 (six) hours as needed for muscle spasms.  60 tablet 0   No current facility-administered medications on file prior to visit.     Allergies  Allergen Reactions  . Lisinopril Anaphylaxis and Swelling    Swelling of tongue and mouth    Family History  Problem Relation Age of Onset  . Heart attack Brother     Massive MI in 83s  . Stroke Brother     CAD  . Kidney disease Mother   . Stroke Mother   . Diabetes Mother   . Liver disease Sister   . COPD Sister     had I-131 rx of hyperthyroidism  . Diabetes Sister   . Stroke Sister   . Cancer Paternal Uncle   . Cancer Cousin   . Heart failure Father   . Heart disease Father   . Arthritis Father   . Sarcoidosis Sister     BP 134/86   Pulse 65   Ht '5\' 6"'$  (1.676 m)   Wt 253 lb (114.8 kg)   BMI 40.84 kg/m    Review of Systems She has lost weight.      Objective:   Physical Exam VITAL SIGNS:  See vs page GENERAL: no distress eyes: no periorbital swelling, but there is bilat proptosis  NECK: There is no palpable thyroid enlargement.  No thyroid nodule is palpable.  No palpable lymphadenopathy at the anterior neck.   Lab Results  Component Value Date   TSH 3.64 03/07/2016   T3TOTAL 271.2 (H) 12/15/2007   T4TOTAL 13.9 (H) 12/15/2007      Assessment & Plan:  post-RAI hypothyroidism: well-replaced.  Please continue the same medication.

## 2016-03-07 NOTE — Patient Instructions (Addendum)
A thyroid blood test is requested for you today.  We'll let you know about the results.   Please return in 1 year.

## 2016-03-07 NOTE — Telephone Encounter (Signed)
Should have received Rx from [facility] for pain medication, if this is not going to last her until her next appt we need to call pharmacy and verify when it was last filled and number of tablets given

## 2016-03-07 NOTE — Telephone Encounter (Signed)
Okay to refill? 

## 2016-03-07 NOTE — Telephone Encounter (Signed)
Patient was Discharged from [facility] on 02/16/2016. Has an appointment to follow up with you on 03/22/16. Patient is requesting a Rx for her prescribed Hydrocodone, Is this ok to refill? Please Advise.

## 2016-03-07 NOTE — Telephone Encounter (Signed)
Tried calling patient but voicemail is not set up and cannot leave message.

## 2016-03-07 NOTE — Telephone Encounter (Signed)
Spoke with patient, patient did not get any refills from [facility]. Patient was given her pill bottles that she came with.  I called [facility] [place] Hydrocodone 5-325 was last filled on 01-11-16.   Please advise if ok to fill

## 2016-03-08 ENCOUNTER — Telehealth: Payer: Self-pay

## 2016-03-08 MED ORDER — HYDROCODONE-ACETAMINOPHEN 5-325 MG PO TABS: 1.0000 | ORAL_TABLET | Freq: Four times a day (QID) | ORAL | 0 refills | Status: DC | PRN

## 2016-03-08 NOTE — Telephone Encounter (Signed)
Rx printed

## 2016-03-08 NOTE — Telephone Encounter (Signed)
I called the patient to let her know that a prescription for Norco 5-325 mg tablet is ready to pick up. Prescription was placed in filing cabinet at front desk.

## 2016-03-09 DIAGNOSIS — B20 Human immunodeficiency virus [HIV] disease: Secondary | ICD-10-CM | POA: Diagnosis not present

## 2016-03-09 DIAGNOSIS — I129 Hypertensive chronic kidney disease with stage 1 through stage 4 chronic kidney disease, or unspecified chronic kidney disease: Secondary | ICD-10-CM | POA: Diagnosis not present

## 2016-03-09 DIAGNOSIS — I739 Peripheral vascular disease, unspecified: Secondary | ICD-10-CM | POA: Diagnosis not present

## 2016-03-09 DIAGNOSIS — N183 Chronic kidney disease, stage 3 (moderate): Secondary | ICD-10-CM | POA: Diagnosis not present

## 2016-03-09 DIAGNOSIS — Z471 Aftercare following joint replacement surgery: Secondary | ICD-10-CM | POA: Diagnosis not present

## 2016-03-12 DIAGNOSIS — B20 Human immunodeficiency virus [HIV] disease: Secondary | ICD-10-CM | POA: Diagnosis not present

## 2016-03-12 DIAGNOSIS — I739 Peripheral vascular disease, unspecified: Secondary | ICD-10-CM | POA: Diagnosis not present

## 2016-03-12 DIAGNOSIS — N183 Chronic kidney disease, stage 3 (moderate): Secondary | ICD-10-CM | POA: Diagnosis not present

## 2016-03-12 DIAGNOSIS — I129 Hypertensive chronic kidney disease with stage 1 through stage 4 chronic kidney disease, or unspecified chronic kidney disease: Secondary | ICD-10-CM | POA: Diagnosis not present

## 2016-03-12 DIAGNOSIS — Z471 Aftercare following joint replacement surgery: Secondary | ICD-10-CM | POA: Diagnosis not present

## 2016-03-14 DIAGNOSIS — B20 Human immunodeficiency virus [HIV] disease: Secondary | ICD-10-CM | POA: Diagnosis not present

## 2016-03-14 DIAGNOSIS — I739 Peripheral vascular disease, unspecified: Secondary | ICD-10-CM | POA: Diagnosis not present

## 2016-03-14 DIAGNOSIS — I129 Hypertensive chronic kidney disease with stage 1 through stage 4 chronic kidney disease, or unspecified chronic kidney disease: Secondary | ICD-10-CM | POA: Diagnosis not present

## 2016-03-14 DIAGNOSIS — N183 Chronic kidney disease, stage 3 (moderate): Secondary | ICD-10-CM | POA: Diagnosis not present

## 2016-03-14 DIAGNOSIS — Z471 Aftercare following joint replacement surgery: Secondary | ICD-10-CM | POA: Diagnosis not present

## 2016-03-16 DIAGNOSIS — I129 Hypertensive chronic kidney disease with stage 1 through stage 4 chronic kidney disease, or unspecified chronic kidney disease: Secondary | ICD-10-CM | POA: Diagnosis not present

## 2016-03-16 DIAGNOSIS — B20 Human immunodeficiency virus [HIV] disease: Secondary | ICD-10-CM | POA: Diagnosis not present

## 2016-03-16 DIAGNOSIS — I739 Peripheral vascular disease, unspecified: Secondary | ICD-10-CM | POA: Diagnosis not present

## 2016-03-16 DIAGNOSIS — Z471 Aftercare following joint replacement surgery: Secondary | ICD-10-CM | POA: Diagnosis not present

## 2016-03-16 DIAGNOSIS — N183 Chronic kidney disease, stage 3 (moderate): Secondary | ICD-10-CM | POA: Diagnosis not present

## 2016-03-19 ENCOUNTER — Telehealth: Payer: Self-pay | Admitting: Endocrinology

## 2016-03-19 MED ORDER — LEVOTHYROXINE SODIUM 175 MCG PO TABS: 175.0000 ug | ORAL_TABLET | Freq: Every day | ORAL | 5 refills | Status: DC

## 2016-03-19 NOTE — Telephone Encounter (Signed)
Pt has been on 150 mg levothyroxine but was increased in rehab to 175  The message says for her to continue the same medicine but which is it the 150 or the 175 and she is out of her meds so [facility]

## 2016-03-19 NOTE — Telephone Encounter (Signed)
I contacted the patient and advised of Md's instructions on her voicemail. [facility] [address]

## 2016-03-19 NOTE — Telephone Encounter (Signed)
The message was referring to the 175/d.  Please refill prn

## 2016-03-19 NOTE — Telephone Encounter (Signed)
See message and please advise, Thanks!  

## 2016-03-21 DIAGNOSIS — Z9889 Other specified postprocedural states: Secondary | ICD-10-CM | POA: Diagnosis not present

## 2016-03-21 DIAGNOSIS — Z96652 Presence of left artificial knee joint: Secondary | ICD-10-CM | POA: Diagnosis not present

## 2016-03-22 ENCOUNTER — Ambulatory Visit (INDEPENDENT_AMBULATORY_CARE_PROVIDER_SITE_OTHER): Payer: Commercial Managed Care - HMO | Admitting: Nurse Practitioner

## 2016-03-22 ENCOUNTER — Encounter: Payer: Self-pay | Admitting: Nurse Practitioner

## 2016-03-22 VITALS — BP 128/84 | HR 90 | Temp 98.0°F | Resp 18 | Ht 66.0 in | Wt 251.2 lb

## 2016-03-22 DIAGNOSIS — N183 Chronic kidney disease, stage 3 unspecified: Secondary | ICD-10-CM

## 2016-03-22 DIAGNOSIS — I10 Essential (primary) hypertension: Secondary | ICD-10-CM | POA: Diagnosis not present

## 2016-03-22 DIAGNOSIS — K5901 Slow transit constipation: Secondary | ICD-10-CM | POA: Diagnosis not present

## 2016-03-22 DIAGNOSIS — E039 Hypothyroidism, unspecified: Secondary | ICD-10-CM

## 2016-03-22 DIAGNOSIS — M1712 Unilateral primary osteoarthritis, left knee: Secondary | ICD-10-CM

## 2016-03-22 DIAGNOSIS — Z23 Encounter for immunization: Secondary | ICD-10-CM

## 2016-03-22 NOTE — Progress Notes (Signed)
Careteam: Patient Care Team: Lauree Chandler, NP as PCP - General (Nurse Practitioner) Truman Hayward, MD as PCP - Infectious Diseases (Infectious Diseases) Clent Jacks, MD as Consulting Physician (Ophthalmology) Renato Shin, MD as Consulting Physician (Endocrinology) Frederik Pear, MD as Consulting Physician (Orthopedic Surgery)  Advanced Directive information Does patient have an advance directive?: Yes, Would patient like information on creating an advanced directive?: Yes - Educational materials given, Type of Advance Directive: Healthcare Power of Attorney  Allergies  Allergen Reactions  . Lisinopril Anaphylaxis and Swelling    Swelling of tongue and mouth    Chief Complaint  Patient presents with  . Medical Management of Chronic Issues    3 month follow up  . Other    Wants flu vaccine today     HPI: Patient is a 60 y.o. female seen in the office today for 3 month follow up. Pt with hx of left knee OA s/p total left knee arthroplasty, CKD, obesity, HIV, hyperlipidemia.  Had second follow up with orthopedic yesterday, everything is going good with her knee. Finished her home therapy last week. Has an evaluation with outpatient therapy next week. Started walking the track and riding the bike again. Going to start the pool again Swelling in left leg is her only complaint which is inhibiting her ROM. Has been improving. No pain Has lost more weight. Cont to eat healthy and trying to increase her activity. Trying to choice healthy options.   HIV - on triple tx; followed by ID Dr Tommy Medal.   HTN-well controlled on benicar/hctz  hyperlipidemia - cholesterol diet controlled  Thyroid - on levothyroxine (increased to 175 mcg after surgery), TSH stable at 3.6  Pt reports her sister passed away soon after her surgery.   Review of Systems:  Review of Systems  Constitutional: Negative for activity change, appetite change, chills, fatigue, fever and unexpected weight  change.  HENT: Negative for congestion, ear discharge, ear pain, hearing loss, postnasal drip, rhinorrhea, sinus pressure and sore throat.   Eyes: Negative for photophobia, pain, discharge, redness, itching and visual disturbance.  Respiratory: Negative for cough and shortness of breath.   Cardiovascular: Negative for chest pain, palpitations and leg swelling.  Gastrointestinal: Negative for abdominal pain, constipation and diarrhea.  Genitourinary: Positive for frequency (chronic frequency, on HTCZ). Negative for difficulty urinating and dysuria.  Musculoskeletal: Positive for arthralgias (L knee pain, constant). Negative for myalgias.       Hx of fall in 2009, fx right hip which will cause her some discomfort if the weather is cold or rainy  Skin: Negative for color change and wound.  Allergic/Immunologic: Negative for environmental allergies.  Neurological: Negative for dizziness and weakness.  Psychiatric/Behavioral: Negative for agitation, behavioral problems and confusion. The patient is not nervous/anxious.     Past Medical History:  Diagnosis Date  . Alopecia areata 11/28/2009  . Bell's palsy   . BREAST CANCER, HX OF 03/27/2006  . Cancer [facility]) 2005   Breast cancer   chemotherapy and radiation  . CKD (chronic kidney disease) stage 3, GFR 30-59 ml/min 06/08/2015  . Dry eye syndrome   . Fasting hyperglycemia   . HIP FRACTURE, RIGHT 05/06/2008  . HIV (human immunodeficiency virus infection) ([place])   . HIV DISEASE 03/27/2006  . HYPERLIPIDEMIA, MIXED 12/15/2007  . HYPERTENSION 03/27/2006  . 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 MENORRHAGIA, POSTMENOPAUSAL 02/03/2009  . Osteoarthritis of left knee 06/08/2015  . OSTEOARTHROSIS, LOCAL, SCND, UNSPC SITE 04/07/2007  . PVD  04/07/2007  . Tinea capitis   . TRIGGER FINGER 05/06/2008  . Unspecified vitamin D deficiency 08/06/2007   Past Surgical History:  Procedure Laterality Date  . BREAST LUMPECTOMY    . COLONOSCOPY    . HYSTEROSCOPY   2006  . LYMPH NODE DISSECTION  2005  . placement   of port-a-cath    . PORT-A-CATH REMOVAL  2006   insertion 2005  . removal of port a cath  12/2014  . THYROID SURGERY  2009   Ablation   . TOTAL KNEE ARTHROPLASTY Left 02/06/2016   Procedure: TOTAL KNEE ARTHROPLASTY;  Surgeon: Frederik Pear, MD;  Location: [facility];  Service: Orthopedics;  Laterality: Left;  . TUBAL LIGATION     Social History:   reports that she has never smoked. She has never used smokeless tobacco. She reports that she does not drink alcohol or use drugs.  Family History  Problem Relation Age of Onset  . Heart attack Brother     Massive MI in 81s  . Stroke Brother     CAD  . Kidney disease Mother   . Stroke Mother   . Diabetes Mother   . Liver disease Sister   . COPD Sister     had I-131 rx of hyperthyroidism  . Diabetes Sister   . Stroke Sister   . Cancer Paternal Uncle   . Cancer Cousin   . Heart failure Father   . Heart disease Father   . Arthritis Father   . Sarcoidosis Sister     Medications: Patient's Medications  New Prescriptions   No medications on file  Previous Medications   CO-ENZYME Q-10 30 MG CAPSULE    Take 1 tablet by mouth daily.   EDURANT 25 MG TABS TABLET    TAKE 1 TABLET BY MOUTH EVERY DAY WITH BREAKFAST   HYDROCODONE-ACETAMINOPHEN (NORCO/VICODIN) 5-325 MG TABLET    Take 1 tablet by mouth every 6 (six) hours as needed for moderate pain.   LEVOTHYROXINE (SYNTHROID, LEVOTHROID) 175 MCG TABLET    Take 1 tablet (175 mcg total) by mouth daily before breakfast.   LORATADINE (CLARITIN) 10 MG TABLET    Take 10 mg by mouth daily as needed for itching. Reported on 06/08/2015   OLMESARTAN-HYDROCHLOROTHIAZIDE (BENICAR HCT) 40-25 MG TABLET    Take 1 tablet by mouth daily.   PITAVASTATIN CALCIUM 4 MG TABS    Take 1 tablet (4 mg total) by mouth daily. This may be placebo, study provided. Do not dispense.   PREZCOBIX 800-150 MG TABLET    TAKE 1 TABLET BY MOUTH DAILY. SWALLOW WHOLE. DO NOT CRUSH,  BREAK OR CHEW TABLETS. TAKE WITH FOOD.   TIVICAY 50 MG TABLET    TAKE 1 TABLET BY MOUTH EVERY DAY   TIZANIDINE (ZANAFLEX) 2 MG TABLET    Take 1 tablet (2 mg total) by mouth every 6 (six) hours as needed for muscle spasms.  Modified Medications   No medications on file  Discontinued Medications   No medications on file     Physical Exam:  Vitals:   03/22/16 1312  BP: 128/84  Pulse: 90  Resp: 18  Temp: 98 F (36.7 C)  TempSrc: Oral  SpO2: 98%  Weight: 251 lb 3.2 oz (113.9 kg)  Height: '5\' 6"'  (1.676 m)   Body mass index is 40.54 kg/m.  Physical Exam  Constitutional: She is oriented to person, place, and time. She appears well-developed and well-nourished.  HENT:  Head: Normocephalic and atraumatic.  Right Ear:  External ear normal.  Left Ear: External ear normal.  Nose: Nose normal.  Mouth/Throat: Oropharynx is clear and moist. No oropharyngeal exudate.  Eyes: Pupils are equal, round, and reactive to light. No scleral icterus.  Neck: Normal range of motion. Neck supple. Carotid bruit is not present.  Cardiovascular: Normal rate, regular rhythm and intact distal pulses.  Exam reveals no gallop and no friction rub.   Murmur (1/6 SEM) heard. Pulmonary/Chest: Effort normal and breath sounds normal. No respiratory distress. She has no wheezes. She has no rales.  Abdominal: Soft. Bowel sounds are normal. She exhibits no distension. There is no hepatomegaly.  Musculoskeletal: She exhibits no edema.  Left knee with reduced ROM & antalgic gait Well healed scar from total knee  Neurological: She is alert and oriented to person, place, and time.  Skin: Skin is warm and dry. No rash noted.  Psychiatric: She has a normal mood and affect. Her behavior is normal. Judgment and thought content normal.    Labs reviewed: Basic Metabolic Panel:  Recent Labs  07/06/15 0932  01/05/16 0925 01/27/16 1028 02/07/16 0620 02/13/16 1358 03/07/16 0854  NA  --   < > 140 139 136 140  --   K   --   < > 4.3 4.1 4.0 4.0  --   CL  --   < > 102 105 100*  --   --   CO2  --   < > '29 28 27  ' --   --   GLUCOSE  --   < > 93 93 125*  --   --   BUN  --   < > '21 18 10 13  ' --   CREATININE  --   < > 1.51* 1.33* 1.22* 1.2*  --   CALCIUM  --   < > 10.4 10.2 9.7  --   --   TSH 1.58  --   --   --   --  8.91* 3.64  < > = values in this interval not displayed. Liver Function Tests:  Recent Labs  11/10/15 1125 11/29/15 1040 01/05/16 0925  AST '20 24 16  ' ALT '14 21 14  ' ALKPHOS 51 56 65  BILITOT 0.8 0.4 0.6  PROT 7.4 7.7 7.4  ALBUMIN 3.9 4.2 4.0   No results for input(s): LIPASE, AMYLASE in the last 8760 hours. No results for input(s): AMMONIA in the last 8760 hours. CBC:  Recent Labs  05/24/15 0959 11/10/15 1125 01/27/16 1028 02/07/16 0620 02/08/16 0606 02/13/16 1358  WBC 5.3 4.7 4.6 9.8 12.0* 6.5  NEUTROABS 2.5 2,115 2.1  --   --   --   HGB 15.0 15.3 15.0 13.8 12.8 11.6*  HCT 42.5 45.9* 45.6 41.3 38.6 36  MCV 92.0 94.6 98.1 96.9 96.0  --   PLT 209 200 190 164 164 237   Lipid Panel:  Recent Labs  05/24/15 0959 11/29/15 1040  CHOL 182 132  HDL 55 57  LDLCALC 107 58  TRIG 102 87  CHOLHDL 3.3 2.3   TSH:  Recent Labs  07/06/15 0932 02/13/16 1358 03/07/16 0854  TSH 1.58 8.91* 3.64   A1C: Lab Results  Component Value Date   HGBA1C 5.7 (H) 10/15/2014     Assessment/Plan 1. Primary osteoarthritis of left knee S/p total knee, doing well with therapy, will cont outpatient therapy at this time Pain well controlled, conts on zanaflex for muscle spams which has been helpful.   2. Essential hypertension, benign -blood pressure stable,  cont benicar  3. Slow transit constipation -well controlled on home regimen, reports worsening of constipation after surgery but stable at this time.   4. Hypothyroidism, unspecified hypothyroidism type -TSH stable on current dose of synthroid. Cont synthroid 175 mcg  5. CKD (chronic kidney disease) stage 3, GFR 30-59 ml/min -to  avoid NSAIDs and stay well hydrated - CBC with Differential/Platelets; Future - CMP with eGFR; Future  Follow up in 6 months, sooner if needed   Delitha Elms K. Harle Battiest  [facility] [phone] 8 am - 5 pm) [phone] (after hours)

## 2016-03-23 DIAGNOSIS — M25572 Pain in left ankle and joints of left foot: Secondary | ICD-10-CM | POA: Diagnosis not present

## 2016-03-23 DIAGNOSIS — Z96652 Presence of left artificial knee joint: Secondary | ICD-10-CM | POA: Diagnosis not present

## 2016-03-23 DIAGNOSIS — M25562 Pain in left knee: Secondary | ICD-10-CM | POA: Diagnosis not present

## 2016-03-23 DIAGNOSIS — R261 Paralytic gait: Secondary | ICD-10-CM | POA: Diagnosis not present

## 2016-03-30 DIAGNOSIS — M25562 Pain in left knee: Secondary | ICD-10-CM | POA: Diagnosis not present

## 2016-03-30 DIAGNOSIS — R261 Paralytic gait: Secondary | ICD-10-CM | POA: Diagnosis not present

## 2016-03-30 DIAGNOSIS — Z96652 Presence of left artificial knee joint: Secondary | ICD-10-CM | POA: Diagnosis not present

## 2016-03-30 DIAGNOSIS — M25662 Stiffness of left knee, not elsewhere classified: Secondary | ICD-10-CM | POA: Diagnosis not present

## 2016-04-02 DIAGNOSIS — R261 Paralytic gait: Secondary | ICD-10-CM | POA: Diagnosis not present

## 2016-04-02 DIAGNOSIS — Z96652 Presence of left artificial knee joint: Secondary | ICD-10-CM | POA: Diagnosis not present

## 2016-04-02 DIAGNOSIS — M25662 Stiffness of left knee, not elsewhere classified: Secondary | ICD-10-CM | POA: Diagnosis not present

## 2016-04-02 DIAGNOSIS — M25562 Pain in left knee: Secondary | ICD-10-CM | POA: Diagnosis not present

## 2016-04-04 DIAGNOSIS — M25562 Pain in left knee: Secondary | ICD-10-CM | POA: Diagnosis not present

## 2016-04-04 DIAGNOSIS — M25662 Stiffness of left knee, not elsewhere classified: Secondary | ICD-10-CM | POA: Diagnosis not present

## 2016-04-04 DIAGNOSIS — R261 Paralytic gait: Secondary | ICD-10-CM | POA: Diagnosis not present

## 2016-04-04 DIAGNOSIS — Z96652 Presence of left artificial knee joint: Secondary | ICD-10-CM | POA: Diagnosis not present

## 2016-04-05 ENCOUNTER — Encounter (INDEPENDENT_AMBULATORY_CARE_PROVIDER_SITE_OTHER): Payer: Self-pay | Admitting: *Deleted

## 2016-04-05 VITALS — BP 143/87 | HR 72 | Temp 97.5°F | Wt 249.8 lb

## 2016-04-05 DIAGNOSIS — Z006 Encounter for examination for normal comparison and control in clinical research program: Secondary | ICD-10-CM

## 2016-04-05 NOTE — Progress Notes (Signed)
Kayla Price is here for month 4 visit [phone], A Randomized Trial to Prevent Vascular Events in HIV (The REPRIEVE Study). Had (L) TKA on 02/06/16 by Dr. Mayer Camel. Incision healed. Currently receiving outpatient PT. Continues to go to the [facility] everyday to exercise.  Working on losing more weight and eating healthy. Denies any muscle aches or weakness. Excellent adherence with her study medication, Denies any missed doses.   Next visit scheduled for 08/03/2016 @ 9:00am

## 2016-04-09 DIAGNOSIS — M25562 Pain in left knee: Secondary | ICD-10-CM | POA: Diagnosis not present

## 2016-04-09 DIAGNOSIS — Z96652 Presence of left artificial knee joint: Secondary | ICD-10-CM | POA: Diagnosis not present

## 2016-04-09 DIAGNOSIS — M25662 Stiffness of left knee, not elsewhere classified: Secondary | ICD-10-CM | POA: Diagnosis not present

## 2016-04-11 DIAGNOSIS — R261 Paralytic gait: Secondary | ICD-10-CM | POA: Diagnosis not present

## 2016-04-11 DIAGNOSIS — M25662 Stiffness of left knee, not elsewhere classified: Secondary | ICD-10-CM | POA: Diagnosis not present

## 2016-04-11 DIAGNOSIS — Z96652 Presence of left artificial knee joint: Secondary | ICD-10-CM | POA: Diagnosis not present

## 2016-04-12 DIAGNOSIS — M25572 Pain in left ankle and joints of left foot: Secondary | ICD-10-CM | POA: Diagnosis not present

## 2016-04-18 DIAGNOSIS — R261 Paralytic gait: Secondary | ICD-10-CM | POA: Diagnosis not present

## 2016-04-18 DIAGNOSIS — M25562 Pain in left knee: Secondary | ICD-10-CM | POA: Diagnosis not present

## 2016-04-18 DIAGNOSIS — M25572 Pain in left ankle and joints of left foot: Secondary | ICD-10-CM | POA: Diagnosis not present

## 2016-04-18 DIAGNOSIS — Z96652 Presence of left artificial knee joint: Secondary | ICD-10-CM | POA: Diagnosis not present

## 2016-04-23 DIAGNOSIS — M25562 Pain in left knee: Secondary | ICD-10-CM | POA: Diagnosis not present

## 2016-04-23 DIAGNOSIS — R261 Paralytic gait: Secondary | ICD-10-CM | POA: Diagnosis not present

## 2016-04-23 DIAGNOSIS — M25572 Pain in left ankle and joints of left foot: Secondary | ICD-10-CM | POA: Diagnosis not present

## 2016-04-23 DIAGNOSIS — Z96652 Presence of left artificial knee joint: Secondary | ICD-10-CM | POA: Diagnosis not present

## 2016-04-25 DIAGNOSIS — M25562 Pain in left knee: Secondary | ICD-10-CM | POA: Diagnosis not present

## 2016-04-25 DIAGNOSIS — M25572 Pain in left ankle and joints of left foot: Secondary | ICD-10-CM | POA: Diagnosis not present

## 2016-04-25 DIAGNOSIS — R261 Paralytic gait: Secondary | ICD-10-CM | POA: Diagnosis not present

## 2016-04-25 DIAGNOSIS — Z96652 Presence of left artificial knee joint: Secondary | ICD-10-CM | POA: Diagnosis not present

## 2016-04-26 DIAGNOSIS — Z96652 Presence of left artificial knee joint: Secondary | ICD-10-CM | POA: Diagnosis not present

## 2016-05-01 DIAGNOSIS — R261 Paralytic gait: Secondary | ICD-10-CM | POA: Diagnosis not present

## 2016-05-01 DIAGNOSIS — M25572 Pain in left ankle and joints of left foot: Secondary | ICD-10-CM | POA: Diagnosis not present

## 2016-05-01 DIAGNOSIS — M25562 Pain in left knee: Secondary | ICD-10-CM | POA: Diagnosis not present

## 2016-05-01 DIAGNOSIS — Z96652 Presence of left artificial knee joint: Secondary | ICD-10-CM | POA: Diagnosis not present

## 2016-05-02 DIAGNOSIS — R261 Paralytic gait: Secondary | ICD-10-CM | POA: Diagnosis not present

## 2016-05-02 DIAGNOSIS — M25562 Pain in left knee: Secondary | ICD-10-CM | POA: Diagnosis not present

## 2016-05-02 DIAGNOSIS — Z96652 Presence of left artificial knee joint: Secondary | ICD-10-CM | POA: Diagnosis not present

## 2016-05-02 DIAGNOSIS — M25572 Pain in left ankle and joints of left foot: Secondary | ICD-10-CM | POA: Diagnosis not present

## 2016-05-10 ENCOUNTER — Other Ambulatory Visit: Payer: Self-pay | Admitting: *Deleted

## 2016-05-10 ENCOUNTER — Telehealth: Payer: Self-pay

## 2016-05-10 MED ORDER — HYDROCODONE-ACETAMINOPHEN 5-325 MG PO TABS: 1.0000 | ORAL_TABLET | Freq: Four times a day (QID) | ORAL | 0 refills | Status: DC | PRN

## 2016-05-10 NOTE — Telephone Encounter (Signed)
Patient requested and will pick up 

## 2016-05-10 NOTE — Telephone Encounter (Signed)
I called patient to let her know that there is a prescription ready to pick up at the office. Prescription is for hydrocodone/APAP 5-325 mg. Take 1 tablet by mouth every 6 (six) hours as needed for moderate pain.   Rx was placed in filing cabinet at front desk.

## 2016-05-14 ENCOUNTER — Other Ambulatory Visit: Payer: Commercial Managed Care - HMO

## 2016-05-14 DIAGNOSIS — B20 Human immunodeficiency virus [HIV] disease: Secondary | ICD-10-CM | POA: Diagnosis not present

## 2016-05-14 DIAGNOSIS — Z96652 Presence of left artificial knee joint: Secondary | ICD-10-CM | POA: Diagnosis not present

## 2016-05-14 DIAGNOSIS — R261 Paralytic gait: Secondary | ICD-10-CM | POA: Diagnosis not present

## 2016-05-14 DIAGNOSIS — M25562 Pain in left knee: Secondary | ICD-10-CM | POA: Diagnosis not present

## 2016-05-14 DIAGNOSIS — Z79899 Other long term (current) drug therapy: Secondary | ICD-10-CM | POA: Diagnosis not present

## 2016-05-14 DIAGNOSIS — M25572 Pain in left ankle and joints of left foot: Secondary | ICD-10-CM | POA: Diagnosis not present

## 2016-05-14 DIAGNOSIS — Z113 Encounter for screening for infections with a predominantly sexual mode of transmission: Secondary | ICD-10-CM

## 2016-05-14 LAB — CBC WITH DIFFERENTIAL/PLATELET
BASOS ABS: 0 {cells}/uL (ref 0–200)
BASOS PCT: 0 %
EOS ABS: 141 {cells}/uL (ref 15–500)
Eosinophils Relative: 3 %
HEMATOCRIT: 43.8 % (ref 35.0–45.0)
Hemoglobin: 14.4 g/dL (ref 11.7–15.5)
LYMPHS PCT: 51 %
Lymphs Abs: 2397 cells/uL (ref 850–3900)
MCH: 30.9 pg (ref 27.0–33.0)
MCHC: 32.9 g/dL (ref 32.0–36.0)
MCV: 94 fL (ref 80.0–100.0)
MONO ABS: 423 {cells}/uL (ref 200–950)
MONOS PCT: 9 %
MPV: 10 fL (ref 7.5–12.5)
NEUTROS PCT: 37 %
Neutro Abs: 1739 cells/uL (ref 1500–7800)
PLATELETS: 221 10*3/uL (ref 140–400)
RBC: 4.66 MIL/uL (ref 3.80–5.10)
RDW: 13.7 % (ref 11.0–15.0)
WBC: 4.7 10*3/uL (ref 3.8–10.8)

## 2016-05-14 LAB — LIPID PANEL
CHOL/HDL RATIO: 3.6 ratio (ref <5.0)
CHOLESTEROL: 186 mg/dL (ref <200)
HDL: 52 mg/dL (ref >50)
LDL Cholesterol: 96 mg/dL (ref <100)
TRIGLYCERIDES: 191 mg/dL — AB (ref <150)
VLDL: 38 mg/dL — ABNORMAL HIGH (ref <30)

## 2016-05-14 LAB — COMPLETE METABOLIC PANEL WITH GFR
ALT: 8 U/L (ref 6–29)
AST: 12 U/L (ref 10–35)
Albumin: 3.7 g/dL (ref 3.6–5.1)
Alkaline Phosphatase: 76 U/L (ref 33–130)
BILIRUBIN TOTAL: 0.4 mg/dL (ref 0.2–1.2)
BUN: 20 mg/dL (ref 7–25)
CALCIUM: 10 mg/dL (ref 8.6–10.4)
CHLORIDE: 103 mmol/L (ref 98–110)
CO2: 33 mmol/L — ABNORMAL HIGH (ref 20–31)
CREATININE: 1.37 mg/dL — AB (ref 0.50–0.99)
GFR, EST AFRICAN AMERICAN: 48 mL/min — AB (ref >=60)
GFR, EST NON AFRICAN AMERICAN: 42 mL/min — AB (ref >=60)
Glucose, Bld: 107 mg/dL — ABNORMAL HIGH (ref 65–99)
Potassium: 4.4 mmol/L (ref 3.5–5.3)
Sodium: 141 mmol/L (ref 135–146)
Total Protein: 7.4 g/dL (ref 6.1–8.1)

## 2016-05-15 LAB — T-HELPER CELL (CD4) - (RCID CLINIC ONLY)
CD4 T CELL ABS: 1010 /uL (ref 400–2700)
CD4 T CELL HELPER: 40 % (ref 33–55)

## 2016-05-15 LAB — RPR

## 2016-05-16 DIAGNOSIS — Z96652 Presence of left artificial knee joint: Secondary | ICD-10-CM | POA: Diagnosis not present

## 2016-05-16 DIAGNOSIS — R261 Paralytic gait: Secondary | ICD-10-CM | POA: Diagnosis not present

## 2016-05-16 DIAGNOSIS — M25562 Pain in left knee: Secondary | ICD-10-CM | POA: Diagnosis not present

## 2016-05-16 DIAGNOSIS — M25572 Pain in left ankle and joints of left foot: Secondary | ICD-10-CM | POA: Diagnosis not present

## 2016-05-16 LAB — HIV-1 RNA QUANT-NO REFLEX-BLD
HIV 1 RNA QUANT: 128 {copies}/mL — AB (ref <20)
HIV-1 RNA QUANT, LOG: 2.11 {Log_copies}/mL — AB (ref <1.30)

## 2016-05-23 DIAGNOSIS — M25572 Pain in left ankle and joints of left foot: Secondary | ICD-10-CM | POA: Diagnosis not present

## 2016-05-23 DIAGNOSIS — Z96652 Presence of left artificial knee joint: Secondary | ICD-10-CM | POA: Diagnosis not present

## 2016-05-23 DIAGNOSIS — R261 Paralytic gait: Secondary | ICD-10-CM | POA: Diagnosis not present

## 2016-05-23 DIAGNOSIS — M25562 Pain in left knee: Secondary | ICD-10-CM | POA: Diagnosis not present

## 2016-05-24 DIAGNOSIS — Z96652 Presence of left artificial knee joint: Secondary | ICD-10-CM | POA: Diagnosis not present

## 2016-05-24 DIAGNOSIS — Z09 Encounter for follow-up examination after completed treatment for conditions other than malignant neoplasm: Secondary | ICD-10-CM | POA: Diagnosis not present

## 2016-05-24 DIAGNOSIS — M25562 Pain in left knee: Secondary | ICD-10-CM | POA: Diagnosis not present

## 2016-05-28 ENCOUNTER — Ambulatory Visit (INDEPENDENT_AMBULATORY_CARE_PROVIDER_SITE_OTHER): Payer: Commercial Managed Care - HMO | Admitting: Infectious Disease

## 2016-05-28 ENCOUNTER — Encounter: Payer: Self-pay | Admitting: Infectious Disease

## 2016-05-28 ENCOUNTER — Telehealth: Payer: Self-pay | Admitting: Lab

## 2016-05-28 ENCOUNTER — Other Ambulatory Visit: Payer: Self-pay | Admitting: Pharmacist

## 2016-05-28 VITALS — BP 123/86 | HR 69 | Temp 97.8°F | Ht 67.0 in | Wt 255.0 lb

## 2016-05-28 DIAGNOSIS — E890 Postprocedural hypothyroidism: Secondary | ICD-10-CM

## 2016-05-28 DIAGNOSIS — I10 Essential (primary) hypertension: Secondary | ICD-10-CM

## 2016-05-28 DIAGNOSIS — Z79899 Other long term (current) drug therapy: Secondary | ICD-10-CM | POA: Diagnosis not present

## 2016-05-28 DIAGNOSIS — R7309 Other abnormal glucose: Secondary | ICD-10-CM

## 2016-05-28 DIAGNOSIS — N182 Chronic kidney disease, stage 2 (mild): Secondary | ICD-10-CM | POA: Diagnosis not present

## 2016-05-28 DIAGNOSIS — B20 Human immunodeficiency virus [HIV] disease: Secondary | ICD-10-CM

## 2016-05-28 LAB — HEMOGLOBIN A1C
Hgb A1c MFr Bld: 5.2 % (ref <5.7)
Mean Plasma Glucose: 103 mg/dL

## 2016-05-28 MED ORDER — RILPIVIRINE HCL 25 MG PO TABS: ORAL_TABLET | ORAL | 11 refills | Status: DC

## 2016-05-28 MED ORDER — OLMESARTAN MEDOXOMIL-HCTZ 40-25 MG PO TABS: 1.0000 | ORAL_TABLET | Freq: Every day | ORAL | 3 refills | Status: DC

## 2016-05-28 MED ORDER — DOLUTEGRAVIR SODIUM 50 MG PO TABS: 50.0000 mg | ORAL_TABLET | Freq: Every day | ORAL | 11 refills | Status: DC

## 2016-05-28 MED ORDER — DARUNAVIR-COBICISTAT 800-150 MG PO TABS: ORAL_TABLET | ORAL | 11 refills | Status: DC

## 2016-05-28 NOTE — Progress Notes (Signed)
Chief complaint: He has noticed that her blood glucose was higher when checked recently when she believes she was fasting Subjective:    Patient ID: Kayla Price, female    DOB: 30-Dec-1955, 60 y.o.   MRN: [id number]  HPI  Kayla Price is a 60 y.o. female who is doing superbly well on her  antiviral regimen, of Tivicay, Prezcobix and Edurant with undetectable viral load and health cd4 count.   At prior [facility] and I reviewed her  HIV and pulled in ALL of the R mutations noted in Dr Ruffin Frederick notes and we had neglected to mention a 41L which when added to all of her other nutation is essentially takes out all the and NRTI's and led to change from prior regimen of Isentress, Atripla to current one.  HIVdb: Genotypic Resistance Interpretation Algorithm  Date: 27-Oct-2014 17:15:11 UTC   Drug Resistance Interpretation: PR PI Major Resistance Mutations: None PI Minor Resistance Mutations: None Other Mutations: V77I Protease Inhibitors atazanavir/r (ATV/r) Susceptible darunavir/r (DRV/r) Susceptible fosamprenavir/r (FPV/r) Susceptible indinavir/r (IDV/r) Susceptible lopinavir/r (LPV/r) Susceptible nelfinavir (NFV) Susceptible saquinavir/r (SQV/r) Susceptible tipranavir/r (TPV/r) Susceptible PR Comments  Drug Resistance Interpretation: RT NRTI Resistance Mutations: M41L, D67N, M184V, L210W, T215Y NNRTI Resistance Mutations: None Other Mutations: E44D, V118I Nucleoside RTI lamivudine (3TC) High-level resistance abacavir (ABC) High-level resistance zidovudine (AZT) High-level resistance stavudine (D4T) High-level resistance didanosine (DDI) High-level resistance emtricitabine (FTC) High-level resistance tenofovir (TDF) High-level resistance Non-Nucleoside RTI efavirenz (EFV) Susceptible etravirine (ETR) Susceptible nevirapine (NVP) Susceptible rilpivirine (RPV) Susceptible   Lab Results  Component Value Date   HIV1RNAQUANT 128 (H) 05/14/2016   HIV1RNAQUANT <20  11/10/2015   HIV1RNAQUANT 38 (H) 05/24/2015     Lab Results  Component Value Date   CD4TABS 1,010 05/14/2016   CD4TABS 810 11/10/2015   CD4TABS 780 05/24/2015       Past Medical History:  Diagnosis Date  . Alopecia areata 11/28/2009  . Bell's palsy   . BREAST CANCER, HX OF 03/27/2006  . Cancer [facility]) 2005   Breast cancer   chemotherapy and radiation  . CKD (chronic kidney disease) stage 3, GFR 30-59 ml/min 06/08/2015  . Dry eye syndrome   . Fasting hyperglycemia   . HIP FRACTURE, RIGHT 05/06/2008  . HIV (human immunodeficiency virus infection) ([place])   . HIV DISEASE 03/27/2006  . HYPERLIPIDEMIA, MIXED 12/15/2007  . HYPERTENSION 03/27/2006  . 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 MENORRHAGIA, POSTMENOPAUSAL 02/03/2009  . Osteoarthritis of left knee 06/08/2015  . OSTEOARTHROSIS, LOCAL, SCND, UNSPC SITE 04/07/2007  . PVD 04/07/2007  . Tinea capitis   . TRIGGER FINGER 05/06/2008  . Unspecified vitamin D deficiency 08/06/2007    Past Surgical History:  Procedure Laterality Date  . BREAST LUMPECTOMY    . COLONOSCOPY    . HYSTEROSCOPY  2006  . LYMPH NODE DISSECTION  2005  . placement   of port-a-cath    . PORT-A-CATH REMOVAL  2006   insertion 2005  . removal of port a cath  12/2014  . THYROID SURGERY  2009   Ablation   . TOTAL KNEE ARTHROPLASTY Left 02/06/2016   Procedure: TOTAL KNEE ARTHROPLASTY;  Surgeon: Frederik Pear, MD;  Location: [facility];  Service: Orthopedics;  Laterality: Left;  . TUBAL LIGATION      Family History  Problem Relation Age of Onset  . Heart attack Brother     Massive MI in 54s  . Stroke Brother     CAD  . Kidney disease Mother   . Stroke Mother   .  Diabetes Mother   . Liver disease Sister   . COPD Sister     had I-131 rx of hyperthyroidism  . Diabetes Sister   . Stroke Sister   . Cancer Paternal Uncle   . Cancer Cousin   . Heart failure Father   . Heart disease Father   . Arthritis Father   . Sarcoidosis Sister       Social History    Social History  . Marital status: Widowed    Spouse name: N/A  . Number of children: N/A  . Years of education: N/A   Occupational History  . Postal Worker Unemployed   Social History Main Topics  . Smoking status: Never Smoker  . Smokeless tobacco: Never Used  . Alcohol use No  . Drug use: No  . Sexual activity: Not Currently     Comment: declined condoms   Other Topics Concern  . None   Social History Narrative   Single/widow   Diet: good   Do you drink/eat things with caffeine? Tea occasionally   Marital status: Widowed  What year were you married? 1993   Do you live in a house, apartment,assisted living, condo,trailer,ect.)? House   Is it one or more stories? Two   How many persons live in your home? 4   Do you have any pets in you home? No   Current or past profession: Tour manager   Do you exercise? Yes  Type&how often: Stationary Bike, Water Exercise 3-4 x week   Do you have a living will? Yes   Do you have a DNR form? No  If not do you what one?   Do you have signed POA/HPOA forms? No   If so, please bring to your appointment.                   Allergies  Allergen Reactions  . Lisinopril Anaphylaxis and Swelling    Swelling of tongue and mouth     Current Outpatient Prescriptions:  .  co-enzyme Q-10 30 MG capsule, Take 1 tablet by mouth daily., Disp: , Rfl:  .  darunavir-cobicistat (PREZCOBIX) 800-150 MG tablet, TAKE 1 TABLET BY MOUTH DAILY. SWALLOW WHOLE. DO NOT CRUSH, BREAK OR CHEW TABLETS. TAKE WITH FOOD., Disp: 30 tablet, Rfl: 11 .  dolutegravir (TIVICAY) 50 MG tablet, Take 1 tablet (50 mg total) by mouth daily., Disp: 30 tablet, Rfl: 11 .  HYDROcodone-acetaminophen (NORCO/VICODIN) 5-325 MG tablet, Take 1 tablet by mouth every 6 (six) hours as needed for moderate pain., Disp: 120 tablet, Rfl: 0 .  levothyroxine (SYNTHROID, LEVOTHROID) 175 MCG tablet, Take 1 tablet (175 mcg total) by mouth daily before breakfast., Disp: 30 tablet, Rfl: 5 .   loratadine (CLARITIN) 10 MG tablet, Take 10 mg by mouth daily as needed for itching. Reported on 06/08/2015, Disp: , Rfl:  .  olmesartan-hydrochlorothiazide (BENICAR HCT) 40-25 MG tablet, Take 1 tablet by mouth daily., Disp: 90 tablet, Rfl: 3 .  Pitavastatin Calcium 4 MG TABS, Take 1 tablet (4 mg total) by mouth daily. This may be placebo, study provided. Do not dispense., Disp: 30 tablet, Rfl: 11 .  rilpivirine (EDURANT) 25 MG TABS tablet, TAKE 1 TABLET BY MOUTH EVERY DAY WITH BREAKFAST, Disp: 30 tablet, Rfl: 11 .  tiZANidine (ZANAFLEX) 2 MG tablet, Take 1 tablet (2 mg total) by mouth every 6 (six) hours as needed for muscle spasms., Disp: 60 tablet, Rfl: 0   Review of Systems  Constitutional: Negative for activity change, appetite  change, chills, diaphoresis, fatigue, fever and unexpected weight change.  HENT: Negative for congestion, postnasal drip, rhinorrhea, sinus pressure, sneezing, sore throat and trouble swallowing.   Eyes: Negative for photophobia and visual disturbance.  Respiratory: Negative for cough, chest tightness, shortness of breath, wheezing and stridor.   Cardiovascular: Negative for chest pain, palpitations and leg swelling.  Gastrointestinal: Negative for abdominal distention, abdominal pain, anal bleeding, blood in stool, constipation, diarrhea, nausea and vomiting.  Genitourinary: Negative for difficulty urinating, dysuria, flank pain and hematuria.  Musculoskeletal: Positive for arthralgias. Negative for back pain, gait problem, joint swelling and myalgias.  Skin: Negative for pallor, rash and wound.  Neurological: Negative for dizziness, tremors, weakness and light-headedness.  Hematological: Negative for adenopathy. Does not bruise/bleed easily.  Psychiatric/Behavioral: Negative for agitation, behavioral problems, confusion, decreased concentration, dysphoric mood and sleep disturbance.       Objective:   Physical Exam  Constitutional: She is oriented to person,  place, and time. She appears well-developed and well-nourished. No distress.  HENT:  Head: Normocephalic and atraumatic.  Mouth/Throat: Oropharynx is clear and moist. No oropharyngeal exudate.  Eyes: Conjunctivae and EOM are normal. Pupils are equal, round, and reactive to light. No scleral icterus.  Neck: Normal range of motion. Neck supple. No JVD present.  Cardiovascular: Normal rate and regular rhythm.   Pulmonary/Chest: Effort normal. No respiratory distress. She has no wheezes.  Abdominal: Soft. She exhibits no distension.  Musculoskeletal: She exhibits edema.  Lymphadenopathy:    She has no cervical adenopathy.  Neurological: She is alert and oriented to person, place, and time. She exhibits normal muscle tone. Coordination normal.  Skin: Skin is warm and dry. She is not diaphoretic. No erythema. No pallor.  Psychiatric: She has a normal mood and affect. Her behavior is normal. Judgment and thought content normal.          Assessment & Plan:   HIV DISEASE   Continue current regimen and RTC in 6 months. She could be dose simple 5 further to PREZCOBIX and JULUCA     HYPERTENSION : well controlled Vitals:   05/28/16 1144  BP: 123/86  Pulse: 69  Temp: 97.8 F (36.6 C)     Morbid obesity: to try to cut calories, carbohydrates  and to followup with Dr. Dewaine Oats  HYPOTHYROIDISM, POST-RADIATION  Followed by Dr. Loanne Drilling,.   Hyperlipidemia: Lipid Panel     Component Value Date/Time   CHOL 186 05/14/2016 1350   CHOL 191 10/15/2014 1007   TRIG 191 (H) 05/14/2016 1350   HDL 52 05/14/2016 1350   HDL 65 10/15/2014 1007   CHOLHDL 3.6 05/14/2016 1350   VLDL 38 (H) 05/14/2016 1350   LDLCALC 96 05/14/2016 1350   LDLCALC 99 10/15/2014 1007      Osteoarthritis: left knee to be replaced in May  CKD:  Lab Results  Component Value Date   CREATININE 1.37 (H) 05/14/2016   CREATININE 1.2 (A) 02/13/2016   CREATININE 1.22 (H) 02/07/2016   Obesity: losing weight via  exercise   Elevated blood glucose will check hemoglobin A1c    I spent greater than 25 minutes with the patient including greater than 50% of time in face to face counsel of the patient re her HIV, her Hypothyroidism, elevated blood sugar HTN, CKD, OA, obesity,CV risk and in coordination of her care.

## 2016-05-28 NOTE — Telephone Encounter (Signed)
Left message for patient to call the office to make a 6 mo appointment.  Kayla Price had left for lunch.

## 2016-05-29 LAB — HIV-1 RNA ULTRAQUANT REFLEX TO GENTYP+: HIV-1 RNA Quant, Log: <1.3 Log copies/mL (ref <1.30)

## 2016-06-01 ENCOUNTER — Telehealth: Payer: Self-pay | Admitting: Nurse Practitioner

## 2016-06-01 NOTE — Telephone Encounter (Signed)
left msg asking pt to confirm this [facility] appt w/ nurse. VDM (DD) °

## 2016-06-06 MED FILL — PREZCOBIX 800 MG-150 MG TAB: 800-150 | 30 days supply | Qty: 30 | Fill #0

## 2016-06-06 MED FILL — EDURANT 25 MG TABS: 25 | 30 days supply | Qty: 30 | Fill #0

## 2016-06-06 MED FILL — TIVICAY 50 MG TABLET: 50 | 30 days supply | Qty: 30 | Fill #0

## 2016-07-01 NOTE — Progress Notes (Signed)
Subjective:    Patient ID: Kayla Price, female    DOB: [birth year], 61 y.o.   MRN: [id number]  HPI Pt returns for f/u of post-RAI hypothyroidism (she had RAI for hyperthyroidism due to [place] in 2009, and now takes synthroid).  In August of 2017, she was noted to have high TSH, and Synthroid was increased to 175 mcg/d.  She says she never misses it.  She says she takes synthroid as rx'ed.  pt states she feels well in general.   Past Medical History:  Diagnosis Date  . Alopecia areata 11/28/2009  . Bell's palsy   . BREAST CANCER, HX OF 03/27/2006  . Cancer [facility]) 2005   Breast cancer   chemotherapy and radiation  . CKD (chronic kidney disease) stage 3, GFR 30-59 ml/min 06/08/2015  . Dry eye syndrome   . Fasting hyperglycemia   . HIP FRACTURE, RIGHT 05/06/2008  . HIV (human immunodeficiency virus infection) ([place])   . HIV DISEASE 03/27/2006  . HYPERLIPIDEMIA, MIXED 12/15/2007  . HYPERTENSION 03/27/2006  . 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 MENORRHAGIA, POSTMENOPAUSAL 02/03/2009  . Osteoarthritis of left knee 06/08/2015  . OSTEOARTHROSIS, LOCAL, SCND, UNSPC SITE 04/07/2007  . PVD 04/07/2007  . Tinea capitis   . TRIGGER FINGER 05/06/2008  . Unspecified vitamin D deficiency 08/06/2007    Past Surgical History:  Procedure Laterality Date  . BREAST LUMPECTOMY    . COLONOSCOPY    . HYSTEROSCOPY  2006  . LYMPH NODE DISSECTION  2005  . placement   of port-a-cath    . PORT-A-CATH REMOVAL  2006   insertion 2005  . removal of port a cath  12/2014  . THYROID SURGERY  2009   Ablation   . TOTAL KNEE ARTHROPLASTY Left 02/06/2016   Procedure: TOTAL KNEE ARTHROPLASTY;  Surgeon: Frederik Pear, MD;  Location: [facility];  Service: Orthopedics;  Laterality: Left;  . TUBAL LIGATION      Social History   Social History  . Marital status: Widowed    Spouse name: N/A  . Number of children: N/A  . Years of education: N/A   Occupational History  . Postal Worker Unemployed   Social  History Main Topics  . Smoking status: Never Smoker  . Smokeless tobacco: Never Used  . Alcohol use No  . Drug use: No  . Sexual activity: Not Currently     Comment: declined condoms   Other Topics Concern  . Not on file   Social History Narrative   Single/widow   Diet: good   Do you drink/eat things with caffeine? Tea occasionally   Marital status: Widowed  What year were you married? 1993   Do you live in a house, apartment,assisted living, condo,trailer,ect.)? House   Is it one or more stories? Two   How many persons live in your home? 4   Do you have any pets in you home? No   Current or past profession: Tour manager   Do you exercise? Yes  Type&how often: Stationary Bike, Water Exercise 3-4 x week   Do you have a living will? Yes   Do you have a DNR form? No  If not do you what one?   Do you have signed POA/HPOA forms? No   If so, please bring to your appointment.                   Current Outpatient Prescriptions on File Prior to Visit  Medication Sig Dispense Refill  . co-enzyme  Q-10 30 MG capsule Take 1 tablet by mouth daily.    . darunavir-cobicistat (PREZCOBIX) 800-150 MG tablet TAKE 1 TABLET BY MOUTH DAILY. SWALLOW WHOLE. DO NOT CRUSH, BREAK OR CHEW TABLETS. TAKE WITH FOOD. 30 tablet 11  . dolutegravir (TIVICAY) 50 MG tablet Take 1 tablet (50 mg total) by mouth daily. 30 tablet 11  . HYDROcodone-acetaminophen (NORCO/VICODIN) 5-325 MG tablet Take 1 tablet by mouth every 6 (six) hours as needed for moderate pain. 120 tablet 0  . levothyroxine (SYNTHROID, LEVOTHROID) 175 MCG tablet Take 1 tablet (175 mcg total) by mouth daily before breakfast. 30 tablet 5  . loratadine (CLARITIN) 10 MG tablet Take 10 mg by mouth daily as needed for itching. Reported on 06/08/2015    . olmesartan-hydrochlorothiazide (BENICAR HCT) 40-25 MG tablet Take 1 tablet by mouth daily. 90 tablet 3  . Pitavastatin Calcium 4 MG TABS Take 1 tablet (4 mg total) by mouth daily. This may be placebo,  study provided. Do not dispense. 30 tablet 11  . rilpivirine (EDURANT) 25 MG TABS tablet TAKE 1 TABLET BY MOUTH EVERY DAY WITH BREAKFAST 30 tablet 11  . tiZANidine (ZANAFLEX) 2 MG tablet Take 1 tablet (2 mg total) by mouth every 6 (six) hours as needed for muscle spasms. 60 tablet 0   No current facility-administered medications on file prior to visit.     Allergies  Allergen Reactions  . Lisinopril Anaphylaxis and Swelling    Swelling of tongue and mouth    Family History  Problem Relation Age of Onset  . Heart attack Brother     Massive MI in 110s  . Stroke Brother     CAD  . Kidney disease Mother   . Stroke Mother   . Diabetes Mother   . Liver disease Sister   . COPD Sister     had I-131 rx of hyperthyroidism  . Diabetes Sister   . Stroke Sister   . Cancer Paternal Uncle   . Cancer Cousin   . Heart failure Father   . Heart disease Father   . Arthritis Father   . Sarcoidosis Sister     BP 122/86   Pulse 77   Ht '5\' 7"'$  (1.702 m)   Wt 257 lb (116.6 kg)   SpO2 96%   BMI 40.25 kg/m    Review of Systems She has regained a few lbs.      Objective:   Physical Exam VITAL SIGNS:  See vs page.   GENERAL: no distress.  eyes: no periorbital swelling, but there is bilat proptosis.   NECK: There is no palpable thyroid enlargement.  No thyroid nodule is palpable.  No palpable lymphadenopathy at the anterior neck.    Lab Results  Component Value Date   TSH 0.53 07/05/2016   T3TOTAL 271.2 (H) 12/15/2007   T4TOTAL 13.9 (H) 12/15/2007       Assessment & Plan:  Post-RAI hypothyroidism: well-replaced.  Please continue the same medication.  Patient is advised the following: Patient Instructions  A thyroid blood test is requested for you today.  We'll let you know about the results.   Please return in 1 year.

## 2016-07-05 ENCOUNTER — Encounter: Payer: Self-pay | Admitting: Endocrinology

## 2016-07-05 ENCOUNTER — Ambulatory Visit (INDEPENDENT_AMBULATORY_CARE_PROVIDER_SITE_OTHER): Payer: Commercial Managed Care - HMO | Admitting: Endocrinology

## 2016-07-05 VITALS — BP 122/86 | HR 77 | Ht 67.0 in | Wt 257.0 lb

## 2016-07-05 DIAGNOSIS — E890 Postprocedural hypothyroidism: Secondary | ICD-10-CM | POA: Diagnosis not present

## 2016-07-05 LAB — TSH: TSH: 0.53 u[IU]/mL (ref 0.35–4.50)

## 2016-07-05 NOTE — Patient Instructions (Addendum)
A thyroid blood test is requested for you today.  We'll let you know about the results.   Please return in 1 year.

## 2016-07-18 MED FILL — TIVICAY 50 MG TABLET: 50 | 30 days supply | Qty: 30 | Fill #1 | Status: TO

## 2016-07-18 MED FILL — PREZCOBIX 800 MG-150 MG TAB: 800-150 | 30 days supply | Qty: 30 | Fill #1 | Status: TO

## 2016-07-18 MED FILL — EDURANT 25 MG TABS: 25 | 30 days supply | Qty: 30 | Fill #1 | Status: TO

## 2016-07-20 ENCOUNTER — Telehealth: Payer: Self-pay

## 2016-07-20 ENCOUNTER — Other Ambulatory Visit: Payer: Self-pay | Admitting: Infectious Disease

## 2016-07-20 ENCOUNTER — Other Ambulatory Visit: Payer: Self-pay | Admitting: *Deleted

## 2016-07-20 DIAGNOSIS — I10 Essential (primary) hypertension: Secondary | ICD-10-CM

## 2016-07-20 MED ORDER — HYDROCODONE-ACETAMINOPHEN 5-325 MG PO TABS: 1.0000 | ORAL_TABLET | Freq: Four times a day (QID) | ORAL | 0 refills | Status: DC | PRN

## 2016-07-20 NOTE — Telephone Encounter (Signed)
Patient requested and will pick up 

## 2016-07-20 NOTE — Telephone Encounter (Signed)
I called patient to let her know that she has a prescription ready to be picked up at the office.   Rx was placed in filing cabinet at front desk.

## 2016-07-24 DIAGNOSIS — M25561 Pain in right knee: Secondary | ICD-10-CM | POA: Diagnosis not present

## 2016-08-03 ENCOUNTER — Encounter (INDEPENDENT_AMBULATORY_CARE_PROVIDER_SITE_OTHER): Payer: Self-pay | Admitting: *Deleted

## 2016-08-03 VITALS — BP 117/77 | HR 71 | Temp 98.1°F | Wt 265.5 lb

## 2016-08-03 DIAGNOSIS — Z006 Encounter for examination for normal comparison and control in clinical research program: Secondary | ICD-10-CM

## 2016-08-03 NOTE — Progress Notes (Signed)
Kayla Price is here for month 8 visit for Reprieve, A Randomized Trial to Prevent Vascular Events in HIV (study drug is Pitavastatin '4mg'$  or placebo).  She reports excellent adherence with her study meds. Has no new problems except for some aching behind her left knee. Continues to work out to strengthen her lower legs since she had knee replacement on the right. She will come back in May for the next study visit.

## 2016-08-27 MED FILL — TIVICAY 50 MG TABLET: 50 | 30 days supply | Qty: 30 | Fill #0

## 2016-08-27 MED FILL — PREZCOBIX 800 MG-150 MG TAB: 800-150 | 30 days supply | Qty: 30 | Fill #0

## 2016-08-27 MED FILL — EDURANT 25 MG TABS: 25 | 30 days supply | Qty: 30 | Fill #0

## 2016-09-04 ENCOUNTER — Other Ambulatory Visit: Payer: Self-pay | Admitting: Infectious Disease

## 2016-09-04 DIAGNOSIS — Z1231 Encounter for screening mammogram for malignant neoplasm of breast: Secondary | ICD-10-CM

## 2016-09-10 ENCOUNTER — Other Ambulatory Visit: Payer: Self-pay

## 2016-09-10 MED ORDER — LEVOTHYROXINE SODIUM 175 MCG PO TABS: 175.0000 ug | ORAL_TABLET | Freq: Every day | ORAL | 1 refills | Status: DC

## 2016-09-17 ENCOUNTER — Other Ambulatory Visit: Payer: Self-pay | Admitting: *Deleted

## 2016-09-17 MED ORDER — HYDROCODONE-ACETAMINOPHEN 5-325 MG PO TABS: 1.0000 | ORAL_TABLET | Freq: Four times a day (QID) | ORAL | 0 refills | Status: DC | PRN

## 2016-09-17 NOTE — Telephone Encounter (Signed)
Patient requested and will pick up 

## 2016-09-18 ENCOUNTER — Other Ambulatory Visit: Payer: Self-pay

## 2016-09-18 ENCOUNTER — Other Ambulatory Visit: Payer: Commercial Managed Care - HMO

## 2016-09-18 ENCOUNTER — Ambulatory Visit (INDEPENDENT_AMBULATORY_CARE_PROVIDER_SITE_OTHER): Payer: Medicare HMO

## 2016-09-18 VITALS — BP 115/84 | HR 71 | Temp 97.6°F | Ht 67.0 in | Wt 264.0 lb

## 2016-09-18 DIAGNOSIS — Z0000 Encounter for general adult medical examination without abnormal findings: Secondary | ICD-10-CM | POA: Diagnosis not present

## 2016-09-18 DIAGNOSIS — Z1211 Encounter for screening for malignant neoplasm of colon: Secondary | ICD-10-CM | POA: Diagnosis not present

## 2016-09-18 DIAGNOSIS — N183 Chronic kidney disease, stage 3 unspecified: Secondary | ICD-10-CM

## 2016-09-18 LAB — CBC WITH DIFFERENTIAL/PLATELET
BASOS ABS: 0 {cells}/uL (ref 0–200)
Basophils Relative: 0 %
Eosinophils Absolute: 160 cells/uL (ref 15–500)
Eosinophils Relative: 4 %
HEMATOCRIT: 46.2 % — AB (ref 35.0–45.0)
HEMOGLOBIN: 15.4 g/dL (ref 11.7–15.5)
LYMPHS ABS: 1880 {cells}/uL (ref 850–3900)
Lymphocytes Relative: 47 %
MCH: 31.5 pg (ref 27.0–33.0)
MCHC: 33.3 g/dL (ref 32.0–36.0)
MCV: 94.5 fL (ref 80.0–100.0)
MONO ABS: 440 {cells}/uL (ref 200–950)
MPV: 9.9 fL (ref 7.5–12.5)
Monocytes Relative: 11 %
NEUTROS PCT: 38 %
Neutro Abs: 1520 cells/uL (ref 1500–7800)
Platelets: 199 10*3/uL (ref 140–400)
RBC: 4.89 MIL/uL (ref 3.80–5.10)
RDW: 14.1 % (ref 11.0–15.0)
WBC: 4 10*3/uL (ref 3.8–10.8)

## 2016-09-18 LAB — COMPLETE METABOLIC PANEL WITH GFR
ALBUMIN: 3.8 g/dL (ref 3.6–5.1)
ALK PHOS: 65 U/L (ref 33–130)
ALT: 11 U/L (ref 6–29)
AST: 14 U/L (ref 10–35)
BILIRUBIN TOTAL: 0.6 mg/dL (ref 0.2–1.2)
BUN: 18 mg/dL (ref 7–25)
CALCIUM: 10.4 mg/dL (ref 8.6–10.4)
CO2: 30 mmol/L (ref 20–31)
CREATININE: 1.37 mg/dL — AB (ref 0.50–0.99)
Chloride: 101 mmol/L (ref 98–110)
GFR, Est African American: 48 mL/min — ABNORMAL LOW (ref >=60)
GFR, Est Non African American: 42 mL/min — ABNORMAL LOW (ref >=60)
Glucose, Bld: 93 mg/dL (ref 65–99)
POTASSIUM: 4.4 mmol/L (ref 3.5–5.3)
Sodium: 139 mmol/L (ref 135–146)
Total Protein: 7.6 g/dL (ref 6.1–8.1)

## 2016-09-18 NOTE — Patient Instructions (Addendum)
Kayla Price , Thank you for taking time to come for your Medicare Wellness Visit. I appreciate your ongoing commitment to your health goals. Please review the following plan we discussed and let me know if I can assist you in the future.   Screening recommendations/referrals: Colonoscopy due. I will put referral in Mammogram due 08/2017 Bone Density up to date. Recommended yearly ophthalmology/optometry visit for glaucoma screening and checkup Recommended yearly dental visit for hygiene and checkup  Vaccinations: Influenza vaccine up to date Pneumococcal vaccine up to date. Tdap vaccine due. Call us after figuring out insurance coverage and we will put in order. Shingles vaccine due.Call us after figuring out insurance coverage and we will put in order.   Advanced directives: In chart.  Conditions/risks identified: None   Next appointment: Kayla Mustache, NP 3/29 '@1pm'$   Preventive Care 40-64 Years, Female Preventive care refers to lifestyle choices and visits with your health care provider that can promote health and wellness. What does preventive care include?  A yearly physical exam. This is also called an annual well check.  Dental exams once or twice a year.  Routine eye exams. Ask your health care provider how often you should have your eyes checked.  Personal lifestyle choices, including:  Daily care of your teeth and gums.  Regular physical activity.  Eating a healthy diet.  Avoiding tobacco and drug use.  Limiting alcohol use.  Practicing safe sex.  Taking low-dose aspirin daily starting at age 61.  Taking vitamin and mineral supplements as recommended by your health care provider. What happens during an annual well check? The services and screenings done by your health care provider during your annual well check will depend on your age, overall health, lifestyle risk factors, and family history of disease. Counseling  Your health care provider may ask you  questions about your:  Alcohol use.  Tobacco use.  Drug use.  Emotional well-being.  Home and relationship well-being.  Sexual activity.  Eating habits.  Work and work Statistician.  Method of birth control.  Menstrual cycle.  Pregnancy history. Screening  You may have the following tests or measurements:  Height, weight, and BMI.  Blood pressure.  Lipid and cholesterol levels. These may be checked every 5 years, or more frequently if you are over 62 years old.  Skin check.  Lung cancer screening. You may have this screening every year starting at age 31 if you have a 30-pack-year history of smoking and currently smoke or have quit within the past 15 years.  Fecal occult blood test (FOBT) of the stool. You may have this test every year starting at age 54.  Flexible sigmoidoscopy or colonoscopy. You may have a sigmoidoscopy every 5 years or a colonoscopy every 10 years starting at age 18.  Hepatitis C blood test.  Hepatitis B blood test.  Sexually transmitted disease (STD) testing.  Diabetes screening. This is done by checking your blood sugar (glucose) after you have not eaten for a while (fasting). You may have this done every 1-3 years.  Mammogram. This may be done every 1-2 years. Talk to your health care provider about when you should start having regular mammograms. This may depend on whether you have a family history of breast cancer.  BRCA-related cancer screening. This may be done if you have a family history of breast, ovarian, tubal, or peritoneal cancers.  Pelvic exam and Pap test. This may be done every 3 years starting at age 55. Starting at age 5, this  may be done every 5 years if you have a Pap test in combination with an HPV test.  Bone density scan. This is done to screen for osteoporosis. You may have this scan if you are at high risk for osteoporosis. Discuss your test results, treatment options, and if necessary, the need for more tests with  your health care provider. Vaccines  Your health care provider may recommend certain vaccines, such as:  Influenza vaccine. This is recommended every year.  Tetanus, diphtheria, and acellular pertussis (Tdap, Td) vaccine. You may need a Td booster every 10 years.  Zoster vaccine. You may need this after age 28.  Pneumococcal 13-valent conjugate (PCV13) vaccine. You may need this if you have certain conditions and were not previously vaccinated.  Pneumococcal polysaccharide (PPSV23) vaccine. You may need one or two doses if you smoke cigarettes or if you have certain conditions. Talk to your health care provider about which screenings and vaccines you need and how often you need them. This information is not intended to replace advice given to you by your health care provider. Make sure you discuss any questions you have with your health care provider. Document Released: 07/08/2015 Document Revised: 02/29/2016 Document Reviewed: 04/12/2015 Elsevier Interactive Patient Education  2017 [place] Prevention in the Home Falls can cause injuries. They can happen to people of all ages. There are many things you can do to make your home safe and to help prevent falls. What can I do on the outside of my home?  Regularly fix the edges of walkways and driveways and fix any cracks.  Remove anything that might make you trip as you walk through a door, such as a raised step or threshold.  Trim any bushes or trees on the path to your home.  Use bright outdoor lighting.  Clear any walking paths of anything that might make someone trip, such as rocks or tools.  Regularly check to see if handrails are loose or broken. Make sure that both sides of any steps have handrails.  Any raised decks and porches should have guardrails on the edges.  Have any leaves, snow, or ice cleared regularly.  Use sand or salt on walking paths during winter.  Clean up any spills in your garage right  away. This includes oil or grease spills. What can I do in the bathroom?  Use night lights.  Install grab bars by the toilet and in the tub and shower. Do not use towel bars as grab bars.  Use non-skid mats or decals in the tub or shower.  If you need to sit down in the shower, use a plastic, non-slip stool.  Keep the floor dry. Clean up any water that spills on the floor as soon as it happens.  Remove soap buildup in the tub or shower regularly.  Attach bath mats securely with double-sided non-slip rug tape.  Do not have throw rugs and other things on the floor that can make you trip. What can I do in the bedroom?  Use night lights.  Make sure that you have a light by your bed that is easy to reach.  Do not use any sheets or blankets that are too big for your bed. They should not hang down onto the floor.  Have a firm chair that has side arms. You can use this for support while you get dressed.  Do not have throw rugs and other things on the floor that can make you  trip. What can I do in the kitchen?  Clean up any spills right away.  Avoid walking on wet floors.  Keep items that you use a lot in easy-to-reach places.  If you need to reach something above you, use a strong step stool that has a grab bar.  Keep electrical cords out of the way.  Do not use floor polish or wax that makes floors slippery. If you must use wax, use non-skid floor wax.  Do not have throw rugs and other things on the floor that can make you trip. What can I do with my stairs?  Do not leave any items on the stairs.  Make sure that there are handrails on both sides of the stairs and use them. Fix handrails that are broken or loose. Make sure that handrails are as long as the stairways.  Check any carpeting to make sure that it is firmly attached to the stairs. Fix any carpet that is loose or worn.  Avoid having throw rugs at the top or bottom of the stairs. If you do have throw rugs, attach  them to the floor with carpet tape.  Make sure that you have a light switch at the top of the stairs and the bottom of the stairs. If you do not have them, ask someone to add them for you. What else can I do to help prevent falls?  Wear shoes that:  Do not have high heels.  Have rubber bottoms.  Are comfortable and fit you well.  Are closed at the toe. Do not wear sandals.  If you use a stepladder:  Make sure that it is fully opened. Do not climb a closed stepladder.  Make sure that both sides of the stepladder are locked into place.  Ask someone to hold it for you, if possible.  Clearly mark and make sure that you can see:  Any grab bars or handrails.  First and last steps.  Where the edge of each step is.  Use tools that help you move around (mobility aids) if they are needed. These include:  Canes.  Walkers.  Scooters.  Crutches.  Turn on the lights when you go into a dark area. Replace any light bulbs as soon as they burn out.  Set up your furniture so you have a clear path. Avoid moving your furniture around.  If any of your floors are uneven, fix them.  If there are any pets around you, be aware of where they are.  Review your medicines with your doctor. Some medicines can make you feel dizzy. This can increase your chance of falling. Ask your doctor what other things that you can do to help prevent falls. This information is not intended to replace advice given to you by your health care provider. Make sure you discuss any questions you have with your health care provider. Document Released: 04/07/2009 Document Revised: 11/17/2015 Document Reviewed: 07/16/2014 Elsevier Interactive Patient Education  2017 [employer].

## 2016-09-18 NOTE — Progress Notes (Signed)
Quick Notes   Health Maintenance: Put in referral for colonoscopy. Pt due for pap smear and agreed to having it done when she is here next. Pt due for TDAP and discussed with her      Abnormal Screen: no MMSE, pt under 65     Patient Concerns: None     Nurse Concerns: None

## 2016-09-18 NOTE — Progress Notes (Signed)
. Subjective:   Kayla Price is a 61 y.o. female who presents for an Initial Medicare Annual Wellness Visit.     Objective:    Today's Vitals   09/18/16 0832 09/18/16 0855  BP: 115/84   Pulse: 71   Temp: 97.6 F (36.4 C)   TempSrc: Oral   SpO2: 98%   Weight: 264 lb (119.7 kg)   Height: '5\' 7"'$  (1.702 m)   PainSc:  0-No pain   Body mass index is 41.35 kg/m.   Current Medications (verified) Outpatient Encounter Prescriptions as of 09/18/2016  Medication Sig  . darunavir-cobicistat (PREZCOBIX) 800-150 MG tablet TAKE 1 TABLET BY MOUTH DAILY. SWALLOW WHOLE. DO NOT CRUSH, BREAK OR CHEW TABLETS. TAKE WITH FOOD.  Marland Kitchen dolutegravir (TIVICAY) 50 MG tablet Take 1 tablet (50 mg total) by mouth daily.  Marland Kitchen HYDROcodone-acetaminophen (NORCO/VICODIN) 5-325 MG tablet Take 1 tablet by mouth every 6 (six) hours as needed for moderate pain.  Marland Kitchen levothyroxine (SYNTHROID, LEVOTHROID) 175 MCG tablet Take 1 tablet (175 mcg total) by mouth daily before breakfast.  . loratadine (CLARITIN) 10 MG tablet Take 10 mg by mouth daily as needed for itching. Reported on 06/08/2015  . olmesartan-hydrochlorothiazide (BENICAR HCT) 40-25 MG tablet TAKE 1 TABLET EVERY DAY  . Pitavastatin Calcium 4 MG TABS Take 1 tablet (4 mg total) by mouth daily. This may be placebo, study provided. Do not dispense.  . rilpivirine (EDURANT) 25 MG TABS tablet TAKE 1 TABLET BY MOUTH EVERY DAY WITH BREAKFAST  . tiZANidine (ZANAFLEX) 2 MG tablet Take 1 tablet (2 mg total) by mouth every 6 (six) hours as needed for muscle spasms.  Marland Kitchen co-enzyme Q-10 30 MG capsule Take 1 tablet by mouth daily.   No facility-administered encounter medications on file as of 09/18/2016.     Allergies (verified) Lisinopril   History: Past Medical History:  Diagnosis Date  . Alopecia areata 11/28/2009  . Bell's palsy   . BREAST CANCER, HX OF 03/27/2006  . Cancer [facility]) 2005   Breast cancer   chemotherapy and radiation  . CKD (chronic kidney disease) stage  3, GFR 30-59 ml/min 06/08/2015  . Dry eye syndrome   . Fasting hyperglycemia   . HIP FRACTURE, RIGHT 05/06/2008  . HIV (human immunodeficiency virus infection) ([place])   . HIV DISEASE 03/27/2006  . HYPERLIPIDEMIA, MIXED 12/15/2007  . HYPERTENSION 03/27/2006  . 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 MENORRHAGIA, POSTMENOPAUSAL 02/03/2009  . Osteoarthritis of left knee 06/08/2015  . OSTEOARTHROSIS, LOCAL, SCND, UNSPC SITE 04/07/2007  . PVD 04/07/2007  . Tinea capitis   . TRIGGER FINGER 05/06/2008  . Unspecified vitamin D deficiency 08/06/2007   Past Surgical History:  Procedure Laterality Date  . BREAST LUMPECTOMY    . COLONOSCOPY    . HYSTEROSCOPY  2006  . LYMPH NODE DISSECTION  2005  . placement   of port-a-cath    . PORT-A-CATH REMOVAL  2006   insertion 2005  . removal of port a cath  12/2014  . THYROID SURGERY  2009   Ablation   . TOTAL KNEE ARTHROPLASTY Left 02/06/2016   Procedure: TOTAL KNEE ARTHROPLASTY;  Surgeon: Frederik Pear, MD;  Location: [place];  Service: Orthopedics;  Laterality: Left;  . TUBAL LIGATION     Family History  Problem Relation Age of Onset  . Heart attack Brother     Massive MI in 70s  . Stroke Brother     CAD  . Kidney disease Mother   . Stroke Mother   .  Diabetes Mother   . Liver disease Sister   . COPD Sister     had I-131 rx of hyperthyroidism  . Diabetes Sister   . Stroke Sister   . Cancer Paternal Uncle   . Cancer Cousin   . Heart failure Father   . Heart disease Father   . Arthritis Father   . Sarcoidosis Sister    Social History   Occupational History  . Postal Worker Unemployed   Social History Main Topics  . Smoking status: Never Smoker  . Smokeless tobacco: Never Used  . Alcohol use No  . Drug use: No  . Sexual activity: Not Currently     Comment: declined condoms    Tobacco Counseling Counseling given: Not Answered   Activities of Daily Living In your present state of health, do you have any difficulty performing  the following activities: 09/18/2016 02/08/2016  Hearing? N -  Vision? N -  Difficulty concentrating or making decisions? Y -  Walking or climbing stairs? Y -  Dressing or bathing? N -  Doing errands, shopping? N Y  Conservation officer, nature and eating ? N -  Using the Toilet? N -  In the past six months, have you accidently leaked urine? N -  Do you have problems with loss of bowel control? N -  Managing your Medications? N -  Managing your Finances? N -  Housekeeping or managing your Housekeeping? N -  Some recent data might be hidden    Immunizations and Health Maintenance Immunization History  Administered Date(s) Administered  . Hepatitis B 07/11/1992, 11/17/2001, 03/02/2002, 04/09/2011  . Influenza Split 04/10/2011, 03/05/2012  . Influenza Whole 03/27/2006, 04/07/2007, 05/06/2008, 05/16/2009, 09/27/2010  . Influenza,inj,Quad PF,36+ Mos 03/09/2013, 04/23/2014, 03/22/2016  . Influenza-Unspecified 06/08/2015  . Pneumococcal Polysaccharide-23 03/27/2006, 02/27/2012   Health Maintenance Due  Topic Date Due  . TETANUS/TDAP  01/07/1975  . COLONOSCOPY  06/25/2016    Patient Care Team: Lauree Chandler, NP as PCP - General (Nurse Practitioner) Truman Hayward, MD as PCP - Infectious Diseases (Infectious Diseases) Clent Jacks, MD as Consulting Physician (Ophthalmology) Renato Shin, MD as Consulting Physician (Endocrinology) Frederik Pear, MD as Consulting Physician (Orthopedic Surgery)  Indicate any recent Medical Services you may have received from other than [facility] providers in the past year (date may be approximate).     Assessment:   This is a routine wellness examination for [facility].  Hearing/Vision screen No exam data present  Dietary issues and exercise activities discussed: Current Exercise Habits: Structured exercise class, Type of exercise: strength training/weights;walking (bike), Time (Minutes): 60, Frequency (Times/Week): >7, Weekly Exercise (Minutes/Week): 0,  Intensity: Moderate, Exercise limited by: None identified  Goals    . Exercise 3x per week (30 min per time)          Starting 09/18/16 I will continue my days at the gym and try to lose weight.     . Weight (lb) < 200 lb (90.7 kg)      Depression Screen PHQ 2/9 Scores 09/18/2016 05/28/2016 12/20/2015 11/23/2015 06/08/2015 12/09/2014 12/01/2014  PHQ - 2 Score 0 0 0 0 0 0 0    Fall Risk Fall Risk  09/18/2016 05/28/2016 03/22/2016 12/20/2015 11/23/2015  Falls in the past year? Yes No No No No  Number falls in past yr: 1 - - - -  Injury with Fall? No - - - -  Risk for fall due to : - Impaired balance/gait;Impaired mobility - - -  Risk for fall due  to (comments): - - - - -  Follow up Education provided - - - -    Cognitive Function: Within normal limits. MMSE - Mini Mental State Exam 09/18/2016  Not completed: Unable to complete     Screening Tests Health Maintenance  Topic Date Due  . TETANUS/TDAP  01/07/1975  . COLONOSCOPY  06/25/2016  . PAP SMEAR  11/23/2016 (Originally 01/27/2016)  . MAMMOGRAM  08/22/2017  . INFLUENZA VACCINE  Completed  . Hepatitis C Screening  Completed  . HIV Screening  Completed      Plan:    I have personally reviewed and addressed the Medicare Annual Wellness questionnaire and have noted the following in the patient's chart:  A. Medical and social history B. Use of alcohol, tobacco or illicit drugs  C. Current medications and supplements D. Functional ability and status E.  Nutritional status F.  Physical activity G. Advance directives H. List of other physicians I.  Hospitalizations, surgeries, and ER visits in previous 12 months J.  Singer to include hearing, vision, cognitive, depression L. Referrals and appointments - none  In addition, I have reviewed and discussed with patient certain preventive protocols, quality metrics, and best practice recommendations. A written personalized care plan for preventive services as well as general  preventive health recommendations were provided to patient.  See attached scanned questionnaire for additional information.   Signed,   Rich Reining, RN Nurse Health Advisor  I reviewed health advisors note, was available for consultation and agree with documentation and plan.  Carlos American. Harle Battiest  [facility] [phone] 8 am - 5 pm) [phone] (after hours)

## 2016-09-20 ENCOUNTER — Encounter: Payer: Self-pay | Admitting: Nurse Practitioner

## 2016-09-20 ENCOUNTER — Ambulatory Visit (INDEPENDENT_AMBULATORY_CARE_PROVIDER_SITE_OTHER): Payer: Medicare HMO | Admitting: Nurse Practitioner

## 2016-09-20 VITALS — BP 120/80 | HR 75 | Temp 97.6°F | Resp 16 | Ht 67.0 in | Wt 266.0 lb

## 2016-09-20 DIAGNOSIS — N183 Chronic kidney disease, stage 3 unspecified: Secondary | ICD-10-CM

## 2016-09-20 DIAGNOSIS — M1712 Unilateral primary osteoarthritis, left knee: Secondary | ICD-10-CM | POA: Diagnosis not present

## 2016-09-20 DIAGNOSIS — E890 Postprocedural hypothyroidism: Secondary | ICD-10-CM

## 2016-09-20 DIAGNOSIS — E782 Mixed hyperlipidemia: Secondary | ICD-10-CM | POA: Diagnosis not present

## 2016-09-20 DIAGNOSIS — I10 Essential (primary) hypertension: Secondary | ICD-10-CM | POA: Diagnosis not present

## 2016-09-20 DIAGNOSIS — Z1211 Encounter for screening for malignant neoplasm of colon: Secondary | ICD-10-CM | POA: Diagnosis not present

## 2016-09-20 NOTE — Patient Instructions (Signed)
Call and schedule a PAP if needed  DASH Eating Plan DASH stands for "Dietary Approaches to Stop Hypertension." The DASH eating plan is a healthy eating plan that has been shown to reduce high blood pressure (hypertension). It may also reduce your risk for type 2 diabetes, heart disease, and stroke. The DASH eating plan may also help with weight loss. What are tips for following this plan? General guidelines   Avoid eating more than 2,300 mg (milligrams) of salt (sodium) a day. If you have hypertension, you may need to reduce your sodium intake to 1,500 mg a day.  Limit alcohol intake to no more than 1 drink a day for nonpregnant women and 2 drinks a day for men. One drink equals 12 oz of beer, 5 oz of wine, or 1 oz of hard liquor.  Work with your health care provider to maintain a healthy body weight or to lose weight. Ask what an ideal weight is for you.  Get at least 30 minutes of exercise that causes your heart to beat faster (aerobic exercise) most days of the week. Activities may include walking, swimming, or biking.  Work with your health care provider or diet and nutrition specialist (dietitian) to adjust your eating plan to your individual calorie needs. Reading food labels   Check food labels for the amount of sodium per serving. Choose foods with less than 5 percent of the Daily Value of sodium. Generally, foods with less than 300 mg of sodium per serving fit into this eating plan.  To find whole grains, look for the word "whole" as the first word in the ingredient list. Shopping   Buy products labeled as "low-sodium" or "no salt added."  Buy fresh foods. Avoid canned foods and premade or frozen meals. Cooking   Avoid adding salt when cooking. Use salt-free seasonings or herbs instead of table salt or sea salt. Check with your health care provider or pharmacist before using salt substitutes.  Do not fry foods. Cook foods using healthy methods such as baking, boiling,  grilling, and broiling instead.  Cook with heart-healthy oils, such as olive, canola, soybean, or sunflower oil. Meal planning    Eat a balanced diet that includes:  5 or more servings of fruits and vegetables each day. At each meal, try to fill half of your plate with fruits and vegetables.  Up to 6-8 servings of whole grains each day.  Less than 6 oz of lean meat, poultry, or fish each day. A 3-oz serving of meat is about the same size as a deck of cards. One egg equals 1 oz.  2 servings of low-fat dairy each day.  A serving of nuts, seeds, or beans 5 times each week.  Heart-healthy fats. Healthy fats called Omega-3 fatty acids are found in foods such as flaxseeds and coldwater fish, like sardines, salmon, and mackerel.  Limit how much you eat of the following:  Canned or prepackaged foods.  Food that is high in trans fat, such as fried foods.  Food that is high in saturated fat, such as fatty meat.  Sweets, desserts, sugary drinks, and other foods with added sugar.  Full-fat dairy products.  Do not salt foods before eating.  Try to eat at least 2 vegetarian meals each week.  Eat more home-cooked food and less restaurant, buffet, and fast food.  When eating at a restaurant, ask that your food be prepared with less salt or no salt, if possible. What foods are recommended? The items  listed may not be a complete list. Talk with your dietitian about what dietary choices are best for you. Grains  Whole-grain or whole-wheat bread. Whole-grain or whole-wheat pasta. Brown rice. [place] Morrow. Bulgur. Whole-grain and low-sodium cereals. Pita bread. Low-fat, low-sodium crackers. Whole-wheat flour tortillas. Vegetables  Fresh or frozen vegetables (raw, steamed, roasted, or grilled). Low-sodium or reduced-sodium tomato and vegetable juice. Low-sodium or reduced-sodium tomato sauce and tomato paste. Low-sodium or reduced-sodium canned vegetables. Fruits  All fresh, dried, or  frozen fruit. Canned fruit in natural juice (without added sugar). Meat and other protein foods  Skinless chicken or [place]. Ground chicken or [place]. Pork with fat trimmed off. Fish and seafood. Egg whites. Dried beans, peas, or lentils. Unsalted nuts, nut butters, and seeds. Unsalted canned beans. Lean cuts of beef with fat trimmed off. Low-sodium, lean deli meat. Dairy  Low-fat (1%) or fat-free (skim) milk. Fat-free, low-fat, or reduced-fat cheeses. Nonfat, low-sodium ricotta or cottage cheese. Low-fat or nonfat yogurt. Low-fat, low-sodium cheese. Fats and oils  Soft margarine without trans fats. Vegetable oil. Low-fat, reduced-fat, or light mayonnaise and salad dressings (reduced-sodium). Canola, safflower, olive, soybean, and sunflower oils. Avocado. Seasoning and other foods  Herbs. Spices. Seasoning mixes without salt. Unsalted popcorn and pretzels. Fat-free sweets. What foods are not recommended? The items listed may not be a complete list. Talk with your dietitian about what dietary choices are best for you. Grains  Baked goods made with fat, such as croissants, muffins, or some breads. Dry pasta or rice meal packs. Vegetables  Creamed or fried vegetables. Vegetables in a cheese sauce. Regular canned vegetables (not low-sodium or reduced-sodium). Regular canned tomato sauce and paste (not low-sodium or reduced-sodium). Regular tomato and vegetable juice (not low-sodium or reduced-sodium). Angie Fava. Olives. Fruits  Canned fruit in a light or heavy syrup. Fried fruit. Fruit in cream or butter sauce. Meat and other protein foods  Fatty cuts of meat. Ribs. Fried meat. Berniece Salines. Sausage. Bologna and other processed lunch meats. Salami. Fatback. Hotdogs. Bratwurst. Salted nuts and seeds. Canned beans with added salt. Canned or smoked fish. Whole eggs or egg yolks. Chicken or [place] with skin. Dairy  Whole or 2% milk, cream, and half-and-half. Whole or full-fat cream cheese. Whole-fat or sweetened  yogurt. Full-fat cheese. Nondairy creamers. Whipped toppings. Processed cheese and cheese spreads. Fats and oils  Butter. Stick margarine. Lard. Shortening. Ghee. Bacon fat. Tropical oils, such as coconut, palm kernel, or palm oil. Seasoning and other foods  Salted popcorn and pretzels. Onion salt, garlic salt, seasoned salt, table salt, and sea salt. Worcestershire sauce. Tartar sauce. Barbecue sauce. Teriyaki sauce. Soy sauce, including reduced-sodium. Steak sauce. Canned and packaged gravies. Fish sauce. Oyster sauce. Cocktail sauce. Horseradish that you find on the shelf. Ketchup. Mustard. Meat flavorings and tenderizers. Bouillon cubes. Hot sauce and Tabasco sauce. Premade or packaged marinades. Premade or packaged taco seasonings. Relishes. Regular salad dressings. Where to find more information:  National Heart, Lung, and [place]: https://wilson-eaton.com/  American Heart Association: www.heart.org Summary  The DASH eating plan is a healthy eating plan that has been shown to reduce high blood pressure (hypertension). It may also reduce your risk for type 2 diabetes, heart disease, and stroke.  With the DASH eating plan, you should limit salt (sodium) intake to 2,300 mg a day. If you have hypertension, you may need to reduce your sodium intake to 1,500 mg a day.  When on the DASH eating plan, aim to eat more fresh fruits and vegetables, whole grains, lean  proteins, low-fat dairy, and heart-healthy fats.  Work with your health care provider or diet and nutrition specialist (dietitian) to adjust your eating plan to your individual calorie needs. This information is not intended to replace advice given to you by your health care provider. Make sure you discuss any questions you have with your health care provider. Document Released: 05/31/2011 Document Revised: 06/04/2016 Document Reviewed: 06/04/2016 Elsevier Interactive Patient Education  2017 [employer].

## 2016-09-20 NOTE — Progress Notes (Signed)
Careteam: Patient Care Team: Lauree Chandler, NP as PCP - General (Nurse Practitioner) Truman Hayward, MD as PCP - Infectious Diseases (Infectious Diseases) Clent Jacks, MD as Consulting Physician (Ophthalmology) Renato Shin, MD as Consulting Physician (Endocrinology) Frederik Pear, MD as Consulting Physician (Orthopedic Surgery)  Advanced Directive information Does Patient Have a Medical Advance Directive?: Yes, Type of Advance Directive: [place];Living will  Allergies  Allergen Reactions  . Lisinopril Anaphylaxis and Swelling    Swelling of tongue and mouth    Chief Complaint  Patient presents with  . Medical Management of Chronic Issues    Routine 6 month follow-up. review labs   . Medication Refill    No refills needed at  this time      HPI: Patient is a 61 y.o. female seen in the office today for follow up.  Pt with hx of left knee OA s/p total left knee arthroplasty, CKD, obesity, HIV, hyperlipidemia.  Pt would like to have cologuard- does not wish to have colonoscopy  HIV - on triple tx; followed by ID Dr Tommy Medal, has follow up in May  HTN-well controlled on benicar/hctz  hyperlipidemia - diet controlled  Thyroid - on levothyroxine (increased to 175 mcg after surgery), TSH stable in January.   Knee pain has improved, swelling has improved, some neuropathy that she states her orthopedic surgeon reports could take a year.   Reports she has "fallen off the wagon" in regards to diet, still exercising.  Review of Systems:  Review of Systems  Constitutional: Negative for activity change, appetite change, chills, fatigue, fever and unexpected weight change.  HENT: Negative for congestion, ear discharge, ear pain, hearing loss, postnasal drip, rhinorrhea, sinus pressure and sore throat.   Eyes: Negative for photophobia, pain, discharge, redness, itching and visual disturbance.  Respiratory: Negative for cough and shortness of breath.    Cardiovascular: Negative for chest pain, palpitations and leg swelling.  Gastrointestinal: Negative for abdominal pain, constipation and diarrhea.  Genitourinary: Negative for difficulty urinating, dysuria and frequency.  Musculoskeletal: Positive for arthralgias (left knee pain improved). Negative for myalgias.  Skin: Negative for color change and wound.  Allergic/Immunologic: Negative for environmental allergies.  Neurological: Negative for dizziness and weakness.  Psychiatric/Behavioral: Negative for agitation, behavioral problems and confusion. The patient is not nervous/anxious.     Past Medical History:  Diagnosis Date  . Alopecia areata 11/28/2009  . Bell's palsy   . BREAST CANCER, HX OF 03/27/2006  . Cancer [facility]) 2005   Breast cancer   chemotherapy and radiation  . CKD (chronic kidney disease) stage 3, GFR 30-59 ml/min 06/08/2015  . Dry eye syndrome   . Fasting hyperglycemia   . HIP FRACTURE, RIGHT 05/06/2008  . HIV (human immunodeficiency virus infection) ([place])   . HIV DISEASE 03/27/2006  . HYPERLIPIDEMIA, MIXED 12/15/2007  . HYPERTENSION 03/27/2006  . 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 MENORRHAGIA, POSTMENOPAUSAL 02/03/2009  . Osteoarthritis of left knee 06/08/2015  . OSTEOARTHROSIS, LOCAL, SCND, UNSPC SITE 04/07/2007  . PVD 04/07/2007  . Tinea capitis   . TRIGGER FINGER 05/06/2008  . Unspecified vitamin D deficiency 08/06/2007   Past Surgical History:  Procedure Laterality Date  . BREAST LUMPECTOMY    . COLONOSCOPY    . HYSTEROSCOPY  2006  . LYMPH NODE DISSECTION  2005  . placement   of port-a-cath    . PORT-A-CATH REMOVAL  2006   insertion 2005  . removal of port a cath  12/2014  .  THYROID SURGERY  2009   Ablation   . TOTAL KNEE ARTHROPLASTY Left 02/06/2016   Procedure: TOTAL KNEE ARTHROPLASTY;  Surgeon: Frederik Pear, MD;  Location: [facility];  Service: Orthopedics;  Laterality: Left;  . TUBAL LIGATION     Social History:   reports that she has never smoked.  She has never used smokeless tobacco. She reports that she does not drink alcohol or use drugs.  Family History  Problem Relation Age of Onset  . Heart attack Brother     Massive MI in 68s  . Stroke Brother     CAD  . Kidney disease Mother   . Stroke Mother   . Diabetes Mother   . Liver disease Sister   . COPD Sister     had I-131 rx of hyperthyroidism  . Diabetes Sister   . Stroke Sister   . Cancer Paternal Uncle   . Cancer Cousin   . Heart failure Father   . Heart disease Father   . Arthritis Father   . Sarcoidosis Sister     Medications: Patient's Medications  New Prescriptions   No medications on file  Previous Medications   CO-ENZYME Q-10 30 MG CAPSULE    Take 1 tablet by mouth daily.   DARUNAVIR-COBICISTAT (PREZCOBIX) 800-150 MG TABLET    TAKE 1 TABLET BY MOUTH DAILY. SWALLOW WHOLE. DO NOT CRUSH, BREAK OR CHEW TABLETS. TAKE WITH FOOD.   DOLUTEGRAVIR (TIVICAY) 50 MG TABLET    Take 1 tablet (50 mg total) by mouth daily.   HYDROCODONE-ACETAMINOPHEN (NORCO/VICODIN) 5-325 MG TABLET    Take 1 tablet by mouth every 6 (six) hours as needed for moderate pain.   LEVOTHYROXINE (SYNTHROID, LEVOTHROID) 175 MCG TABLET    Take 1 tablet (175 mcg total) by mouth daily before breakfast.   LORATADINE (CLARITIN) 10 MG TABLET    Take 10 mg by mouth daily as needed for itching. Reported on 06/08/2015   OLMESARTAN-HYDROCHLOROTHIAZIDE (BENICAR HCT) 40-25 MG TABLET    TAKE 1 TABLET EVERY DAY   PITAVASTATIN CALCIUM 4 MG TABS    Take 1 tablet (4 mg total) by mouth daily. This may be placebo, study provided. Do not dispense.   RILPIVIRINE (EDURANT) 25 MG TABS TABLET    TAKE 1 TABLET BY MOUTH EVERY DAY WITH BREAKFAST   TIZANIDINE (ZANAFLEX) 2 MG TABLET    Take 1 tablet (2 mg total) by mouth every 6 (six) hours as needed for muscle spasms.  Modified Medications   No medications on file  Discontinued Medications   No medications on file     Physical Exam:  Vitals:   09/20/16 1306  BP: 120/80   Pulse: 75  Resp: 16  Temp: 97.6 F (36.4 C)  TempSrc: Oral  SpO2: 97%  Weight: 266 lb (120.7 kg)  Height: '5\' 7"'$  (1.702 m)   Body mass index is 41.66 kg/m.  Physical Exam  Constitutional: She is oriented to person, place, and time. She appears well-developed and well-nourished. No distress.  HENT:  Head: Normocephalic and atraumatic.  Eyes: Conjunctivae are normal. Pupils are equal, round, and reactive to light.  Neck: Normal range of motion. Neck supple. Carotid bruit is not present.  Cardiovascular: Normal rate and regular rhythm.   Pulmonary/Chest: Effort normal and breath sounds normal.  Abdominal: Soft. Bowel sounds are normal. She exhibits no distension. There is no hepatomegaly.  Musculoskeletal: She exhibits no edema or tenderness.  Left knee with Well healed scar from total knee  Neurological: She is  alert and oriented to person, place, and time.  Skin: Skin is warm and dry. No rash noted. She is not diaphoretic.  Psychiatric: She has a normal mood and affect. Her behavior is normal. Judgment and thought content normal.    Labs reviewed: Basic Metabolic Panel:  Recent Labs  02/07/16 0620 02/13/16 1358 03/07/16 0854 05/14/16 1350 07/05/16 1114 09/18/16 0834  NA 136 140  --  141  --  139  K 4.0 4.0  --  4.4  --  4.4  CL 100*  --   --  103  --  101  CO2 27  --   --  33*  --  30  GLUCOSE 125*  --   --  107*  --  93  BUN 10 13  --  20  --  18  CREATININE 1.22* 1.2*  --  1.37*  --  1.37*  CALCIUM 9.7  --   --  10.0  --  10.4  TSH  --  8.91* 3.64  --  0.53  --    Liver Function Tests:  Recent Labs  01/05/16 0925 05/14/16 1350 09/18/16 0834  AST '16 12 14  '$ ALT '14 8 11  '$ ALKPHOS 65 76 65  BILITOT 0.6 0.4 0.6  PROT 7.4 7.4 7.6  ALBUMIN 4.0 3.7 3.8   No results for input(s): LIPASE, AMYLASE in the last 8760 hours. No results for input(s): AMMONIA in the last 8760 hours. CBC:  Recent Labs  01/27/16 1028  02/08/16 0606 02/13/16 1358 05/14/16 1350  09/18/16 0834  WBC 4.6  < > 12.0* 6.5 4.7 4.0  NEUTROABS 2.1  --   --   --  1,739 1,520  HGB 15.0  < > 12.8 11.6* 14.4 15.4  HCT 45.6  < > 38.6 36 43.8 46.2*  MCV 98.1  < > 96.0  --  94.0 94.5  PLT 190  < > 164 237 221 199  < > = values in this interval not displayed. Lipid Panel:  Recent Labs  11/29/15 1040 05/14/16 1350  CHOL 132 186  HDL 57 52  LDLCALC 58 96  TRIG 87 191*  CHOLHDL 2.3 3.6   TSH:  Recent Labs  02/13/16 1358 03/07/16 0854 07/05/16 1114  TSH 8.91* 3.64 0.53   A1C: Lab Results  Component Value Date   HGBA1C 5.2 05/28/2016     Assessment/Plan 1. Essential hypertension, benign -blood pressure stable, encouraged lifestyle modifications.  - CBC with Differential/Platelets; Future  2. CKD (chronic kidney disease) stage 3, GFR 30-59 ml/min Encourage proper hydration and to avoid NSAIDS (Aleve, Advil, Motrin, Ibuprofen)   3. HYPERLIPIDEMIA, MIXED -follow up lipids prior to next visit, to work on diet and exercise modifications.   4. Screen for colon cancer Interested in cologuard, paperwork completed.   5. Primary osteoarthritis of left knee s/p total knee, strength and swelling has improved. Doing well post op.   6. Postablative hypothyroidism TSH stable in January, cont current regimen   Jessica K. Harle Battiest  [facility] [phone] 8 am - 5 pm) [phone] (after hours)

## 2016-09-25 ENCOUNTER — Ambulatory Visit
Admission: RE | Admit: 2016-09-25 | Discharge: 2016-09-25 | Disposition: A | Discharge status: Home / Self Care | Payer: Commercial Managed Care - HMO | Source: Ambulatory Visit | Attending: Infectious Disease | Admitting: Infectious Disease

## 2016-09-25 DIAGNOSIS — Z1231 Encounter for screening mammogram for malignant neoplasm of breast: Secondary | ICD-10-CM

## 2016-09-25 MED FILL — PREZCOBIX 800 MG-150 MG TAB: 800-150 | 30 days supply | Qty: 30 | Fill #1

## 2016-09-25 MED FILL — TIVICAY 50 MG TABLET: 50 | 30 days supply | Qty: 30 | Fill #1

## 2016-09-25 MED FILL — EDURANT 25 MG TABS: 25 | 30 days supply | Qty: 30 | Fill #1

## 2016-09-26 ENCOUNTER — Other Ambulatory Visit: Payer: Self-pay | Admitting: Infectious Disease

## 2016-09-26 DIAGNOSIS — R928 Other abnormal and inconclusive findings on diagnostic imaging of breast: Secondary | ICD-10-CM

## 2016-09-26 DIAGNOSIS — Z1212 Encounter for screening for malignant neoplasm of rectum: Secondary | ICD-10-CM | POA: Diagnosis not present

## 2016-09-26 DIAGNOSIS — Z1211 Encounter for screening for malignant neoplasm of colon: Secondary | ICD-10-CM | POA: Diagnosis not present

## 2016-09-26 LAB — COLOGUARD: COLOGUARD: NEGATIVE

## 2016-09-27 ENCOUNTER — Ambulatory Visit: Payer: Commercial Managed Care - HMO

## 2016-09-28 ENCOUNTER — Ambulatory Visit
Admission: RE | Admit: 2016-09-28 | Discharge: 2016-09-28 | Disposition: A | Discharge status: Home / Self Care | Payer: Commercial Managed Care - HMO | Source: Ambulatory Visit | Attending: Infectious Disease | Admitting: Infectious Disease

## 2016-09-28 ENCOUNTER — Other Ambulatory Visit: Payer: Self-pay | Admitting: Infectious Disease

## 2016-09-28 DIAGNOSIS — R599 Enlarged lymph nodes, unspecified: Secondary | ICD-10-CM

## 2016-09-28 DIAGNOSIS — R928 Other abnormal and inconclusive findings on diagnostic imaging of breast: Secondary | ICD-10-CM

## 2016-09-28 DIAGNOSIS — N6310 Unspecified lump in the right breast, unspecified quadrant: Secondary | ICD-10-CM

## 2016-09-28 DIAGNOSIS — N6489 Other specified disorders of breast: Secondary | ICD-10-CM | POA: Diagnosis not present

## 2016-10-01 ENCOUNTER — Ambulatory Visit
Admission: RE | Admit: 2016-10-01 | Discharge: 2016-10-01 | Disposition: A | Discharge status: Home / Self Care | Payer: Commercial Managed Care - HMO | Source: Ambulatory Visit | Attending: Infectious Disease | Admitting: Infectious Disease

## 2016-10-01 ENCOUNTER — Other Ambulatory Visit: Payer: Self-pay | Admitting: Infectious Disease

## 2016-10-01 DIAGNOSIS — R599 Enlarged lymph nodes, unspecified: Secondary | ICD-10-CM

## 2016-10-01 DIAGNOSIS — R928 Other abnormal and inconclusive findings on diagnostic imaging of breast: Secondary | ICD-10-CM

## 2016-10-01 DIAGNOSIS — N6310 Unspecified lump in the right breast, unspecified quadrant: Secondary | ICD-10-CM

## 2016-10-01 DIAGNOSIS — R590 Localized enlarged lymph nodes: Secondary | ICD-10-CM | POA: Diagnosis not present

## 2016-10-01 DIAGNOSIS — N6311 Unspecified lump in the right breast, upper outer quadrant: Secondary | ICD-10-CM | POA: Diagnosis not present

## 2016-10-01 DIAGNOSIS — C50411 Malignant neoplasm of upper-outer quadrant of right female breast: Secondary | ICD-10-CM | POA: Diagnosis not present

## 2016-10-03 ENCOUNTER — Encounter: Payer: Self-pay | Admitting: General Surgery

## 2016-10-03 ENCOUNTER — Encounter: Payer: Self-pay | Admitting: *Deleted

## 2016-10-03 DIAGNOSIS — I10 Essential (primary) hypertension: Secondary | ICD-10-CM | POA: Diagnosis not present

## 2016-10-03 DIAGNOSIS — C50911 Malignant neoplasm of unspecified site of right female breast: Secondary | ICD-10-CM | POA: Diagnosis not present

## 2016-10-03 DIAGNOSIS — N183 Chronic kidney disease, stage 3 (moderate): Secondary | ICD-10-CM | POA: Diagnosis not present

## 2016-10-03 DIAGNOSIS — Z8639 Personal history of other endocrine, nutritional and metabolic disease: Secondary | ICD-10-CM | POA: Diagnosis not present

## 2016-10-03 DIAGNOSIS — Z853 Personal history of malignant neoplasm of breast: Secondary | ICD-10-CM | POA: Diagnosis not present

## 2016-10-03 DIAGNOSIS — Z6841 Body Mass Index (BMI) 40.0 and over, adult: Secondary | ICD-10-CM | POA: Diagnosis not present

## 2016-10-03 DIAGNOSIS — B20 Human immunodeficiency virus [HIV] disease: Secondary | ICD-10-CM | POA: Diagnosis not present

## 2016-10-05 ENCOUNTER — Telehealth: Payer: Self-pay | Admitting: *Deleted

## 2016-10-05 NOTE — Telephone Encounter (Signed)
Thanks so much Michelle 

## 2016-10-05 NOTE — Telephone Encounter (Signed)
Left message for patient letting her know Dr Tommy Medal is following her case and offered help if she needed anything. Landis Gandy, RN

## 2016-10-05 NOTE — Telephone Encounter (Signed)
-----   Message from Truman Hayward, MD sent at 10/03/2016 11:23 AM EDT ----- Can someone call Joycelyn Schmid and let her know that I am in the loop re her breast cancer and ask if there is anything we can do to help her out? She is to see Dr. Dalbert Batman it appears

## 2016-10-08 ENCOUNTER — Telehealth: Payer: Self-pay | Admitting: Oncology

## 2016-10-08 NOTE — Telephone Encounter (Signed)
Pt has been scheduled to see Dr. Jana Hakim on 4/17 at 445pm. I asked the pt to come at 4pm for labs prior to appt. Pt agreed to the appt date and time. Demographics verified.

## 2016-10-09 ENCOUNTER — Other Ambulatory Visit: Payer: Self-pay | Admitting: *Deleted

## 2016-10-09 ENCOUNTER — Inpatient Hospital Stay (HOSPITAL_BASED_OUTPATIENT_CLINIC_OR_DEPARTMENT_OTHER): Payer: Commercial Managed Care - HMO

## 2016-10-09 ENCOUNTER — Other Ambulatory Visit: Payer: Self-pay

## 2016-10-09 ENCOUNTER — Inpatient Hospital Stay (HOSPITAL_BASED_OUTPATIENT_CLINIC_OR_DEPARTMENT_OTHER): Payer: Medicare HMO | Admitting: Oncology

## 2016-10-09 DIAGNOSIS — C773 Secondary and unspecified malignant neoplasm of axilla and upper limb lymph nodes: Secondary | ICD-10-CM

## 2016-10-09 DIAGNOSIS — C50919 Malignant neoplasm of unspecified site of unspecified female breast: Secondary | ICD-10-CM

## 2016-10-09 DIAGNOSIS — C50411 Malignant neoplasm of upper-outer quadrant of right female breast: Secondary | ICD-10-CM

## 2016-10-09 DIAGNOSIS — Z171 Estrogen receptor negative status [ER-]: Secondary | ICD-10-CM

## 2016-10-09 LAB — CBC WITH DIFFERENTIAL/PLATELET
BASO%: 0.6 % (ref 0.0–2.0)
Basophils Absolute: 0 10*3/uL (ref 0.0–0.1)
EOS%: 2.6 % (ref 0.0–7.0)
Eosinophils Absolute: 0.2 10*3/uL (ref 0.0–0.5)
HEMATOCRIT: 46.1 % (ref 34.8–46.6)
HEMOGLOBIN: 15.6 g/dL (ref 11.6–15.9)
LYMPH%: 48.5 % (ref 14.0–49.7)
MCH: 31.8 pg (ref 25.1–34.0)
MCHC: 33.8 g/dL (ref 31.5–36.0)
MCV: 94.1 fL (ref 79.5–101.0)
MONO#: 0.8 10*3/uL (ref 0.1–0.9)
MONO%: 11.6 % (ref 0.0–14.0)
NEUT%: 36.7 % — AB (ref 38.4–76.8)
NEUTROS ABS: 2.4 10*3/uL (ref 1.5–6.5)
Platelets: 192 10*3/uL (ref 145–400)
RBC: 4.9 10*6/uL (ref 3.70–5.45)
RDW: 14.1 % (ref 11.2–14.5)
WBC: 6.5 10*3/uL (ref 3.9–10.3)
lymph#: 3.1 10*3/uL (ref 0.9–3.3)

## 2016-10-09 LAB — COMPREHENSIVE METABOLIC PANEL
ALBUMIN: 3.6 g/dL (ref 3.5–5.0)
ALK PHOS: 76 U/L (ref 40–150)
ALT: 15 U/L (ref 0–55)
AST: 15 U/L (ref 5–34)
Anion Gap: 11 mEq/L (ref 3–11)
BILIRUBIN TOTAL: 0.46 mg/dL (ref 0.20–1.20)
BUN: 20.2 mg/dL (ref 7.0–26.0)
CALCIUM: 10.9 mg/dL — AB (ref 8.4–10.4)
CO2: 27 mEq/L (ref 22–29)
Chloride: 105 mEq/L (ref 98–109)
Creatinine: 1.4 mg/dL — ABNORMAL HIGH (ref 0.6–1.1)
EGFR: 47 mL/min/{1.73_m2} — AB (ref >90)
Glucose: 81 mg/dl (ref 70–140)
POTASSIUM: 4.7 meq/L (ref 3.5–5.1)
Sodium: 144 mEq/L (ref 136–145)
TOTAL PROTEIN: 8 g/dL (ref 6.4–8.3)

## 2016-10-09 LAB — DRAW EXTRA CLOT TUBE

## 2016-10-09 NOTE — Progress Notes (Signed)
[facility]  [phone] Fax:(336) [phone]     ID: CERENITY GOSHORN DOB: 1956/06/21  MR#: [id number]  GEX#:[id number]  Patient Care Team: Lauree Chandler, NP as PCP - General (Nurse Practitioner) Truman Hayward, MD as PCP - Infectious Diseases (Infectious Diseases) Clent Jacks, MD as Consulting Physician (Ophthalmology) Renato Shin, MD as Consulting Physician (Endocrinology) Frederik Pear, MD as Consulting Physician (Orthopedic Surgery) Chauncey Cruel, MD as Consulting Physician (Oncology) Chauncey Cruel, MD OTHER MD:  CHIEF COMPLAINT:   CURRENT TREATMENT:    BREAST CANCER HISTORY: Kayla Price is a history of right-sided breast cancer dating back to 2005. She had a lumpectomy with sentinel lymph node sampling, chemotherapy, and radiation. I do not have access to those records at present.  More recently she had bilateral screening mammography at the [facility] 09/25/2016 showing a possible mass in the right breast. Diagnostic mammography with ultrasonography on 09/28/2016 the patient underwent right diagnostic mammography with tomography and right breast ultrasonography. The breast density was category A. In the right breast at the 10:00 position there was an irregular mass measuring 2.5 cm. Ultrasound identified this the 10:00 radiant 10 cm from the nipple measuring 2.4 cm. In the right axilla there was an abnormal lymph node measuring 1.3 cm with other normal-appearing lymph nodes.  On 10/01/2016 she  underwent biopsy of the right breast mass in question as well as the suspicious axillary lymph node. 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 The proliferation marker was 70% in the breast lesion and 50% in the lymph node.  Her subsequent history is as detailed below.   INTERVAL HISTORY: Kayla Price  was evaluated in the breast clinic 10/09/2016 accompanied by her granddaughter   REVIEW OF SYSTEMS: Aside from the mass itself, there were no specific symptoms leading to the original mammogram, which was routinely scheduled. The patient denies unusual headaches, visual changes, nausea, vomiting, stiff neck, dizziness, or gait imbalance. There has been no cough, phlegm production, or pleurisy, no chest pain or pressure, and no change in bowel or bladder habits. The patient denies fever, rash, bleeding, unexplained fatigue or unexplained weight loss. [facility] and she is very active in the care of her 70 year old granddaughter. She tells me her HIV titer is undetectable. A detailed review of systems was otherwise entirely negative.     PAST MEDICAL HISTORY: Past Medical History:  Diagnosis Date  . Alopecia areata 11/28/2009  . Bell's palsy   . BREAST CANCER, HX OF 03/27/2006  . Cancer [facility]) 2005   Breast cancer   chemotherapy and radiation  . CKD (chronic kidney disease) stage 3, GFR 30-59 ml/min 06/08/2015  . Dry eye syndrome   . Fasting hyperglycemia   . HIP FRACTURE, RIGHT 05/06/2008  . HIV (human immunodeficiency virus infection) ([place])   . HIV DISEASE 03/27/2006  . HYPERLIPIDEMIA, MIXED 12/15/2007  . HYPERTENSION 03/27/2006  . 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 MENORRHAGIA, POSTMENOPAUSAL 02/03/2009  . Osteoarthritis of left knee 06/08/2015  . OSTEOARTHROSIS, LOCAL, SCND, UNSPC SITE 04/07/2007  . PVD 04/07/2007  . Tinea capitis   . TRIGGER FINGER 05/06/2008  . Unspecified vitamin D deficiency 08/06/2007    PAST SURGICAL HISTORY: Past Surgical History:  Procedure Laterality Date  . BREAST LUMPECTOMY Right    2005  . COLONOSCOPY    . HYSTEROSCOPY  2006  .  LYMPH NODE DISSECTION  2005  . placement   of port-a-cath    . PORT-A-CATH REMOVAL  2006   insertion 2005  . removal of port a cath  12/2014  . THYROID SURGERY  2009   Ablation   . TOTAL  KNEE ARTHROPLASTY Left 02/06/2016   Procedure: TOTAL KNEE ARTHROPLASTY;  Surgeon: Frederik Pear, MD;  Location: [facility];  Service: Orthopedics;  Laterality: Left;  . TUBAL LIGATION      FAMILY HISTORY Family History  Problem Relation Age of Onset  . Heart attack Brother     Massive MI in 5s  . Stroke Brother     CAD  . Kidney disease Mother   . Stroke Mother   . Diabetes Mother   . Liver disease Sister   . COPD Sister     had I-131 rx of hyperthyroidism  . Diabetes Sister   . Stroke Sister   . Cancer Paternal Uncle   . Cancer Cousin   . Heart failure Father   . Heart disease Father   . Arthritis Father   . Sarcoidosis Sister   The patient's father died at the age of 91 in the setting of Alzheimer's disease. The patient's mother died at the age of 44 from complications of diabetes. The patient has 3 brothers, 4 sisters. There is no history of breast or ovarian cancer in the family area   GYNECOLOGIC HISTORY:  No LMP recorded. Patient is postmenopausal.  menarche age 25, first live birth age 47, the patient is [place] P2. She stopped having periods in 2005, with her chemotherapy; she never took hormone replacement   SOCIAL HISTORY:  Murdered work for the [employer] more than 20 years. 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 At home she lives with her son Kayla Price, her daughter Kayla Price who works for [employer] and her granddaughter Kayla Price, 10 y/o AS OF APRIL 2018     ADVANCED DIRECTIVES:  not in place    HEALTH MAINTENANCE: Social History  Substance Use Topics  . Smoking status: Never Smoker  . Smokeless tobacco: Never Used  . Alcohol use No     Colonoscopy:  PAP:  Bone density:   Allergies  Allergen Reactions  . Lisinopril Anaphylaxis and Swelling    Swelling of tongue and mouth    Current Outpatient Prescriptions  Medication Sig Dispense Refill  . co-enzyme Q-10 30 MG capsule Take 1 tablet by mouth daily.    .  darunavir-cobicistat (PREZCOBIX) 800-150 MG tablet TAKE 1 TABLET BY MOUTH DAILY. SWALLOW WHOLE. DO NOT CRUSH, BREAK OR CHEW TABLETS. TAKE WITH FOOD. 30 tablet 11  . dolutegravir (TIVICAY) 50 MG tablet Take 1 tablet (50 mg total) by mouth daily. 30 tablet 11  . HYDROcodone-acetaminophen (NORCO/VICODIN) 5-325 MG tablet Take 1 tablet by mouth every 6 (six) hours as needed for moderate pain. 120 tablet 0  . levothyroxine (SYNTHROID, LEVOTHROID) 175 MCG tablet Take 1 tablet (175 mcg total) by mouth daily before breakfast. 90 tablet 1  . loratadine (CLARITIN) 10 MG tablet Take 10 mg by mouth daily as needed for itching. Reported on 06/08/2015    . olmesartan-hydrochlorothiazide (BENICAR HCT) 40-25 MG tablet TAKE 1 TABLET EVERY DAY 90 tablet 1  . Pitavastatin Calcium 4 MG TABS Take 1 tablet (4 mg total) by mouth daily. This may be placebo, study provided. Do not dispense. 30 tablet 11  . rilpivirine (EDURANT) 25 MG TABS tablet TAKE 1  TABLET BY MOUTH EVERY DAY WITH BREAKFAST 30 tablet 11  . tiZANidine (ZANAFLEX) 2 MG tablet Take 1 tablet (2 mg total) by mouth every 6 (six) hours as needed for muscle spasms. 60 tablet 0   No current facility-administered medications for this visit.     OBJECTIVE: Middle-aged African-American woman who appears older than stated age  61:   10/09/16 1645  BP: (!) 148/103  Pulse: 63  Resp: 18  Temp: 98.1 F (36.7 C)     Body mass index is 41.5 kg/m.    ECOG FS:1 - Symptomatic but completely ambulatory  Ocular: Sclerae unicteric, pupils round and equal Ear-nose-throat: Oropharynx clear and moist Lymphatic: No cervical or supraclavicular adenopathy Lungs no rales or rhonchi Heart regular rate and rhythm Abd soft, obese, nontender, positive bowel sounds MSK no focal spinal tenderness, no joint edema Neuro: non-focal, well-oriented, appropriate affect Breasts: There is an easily palpable movable mass in the lateral aspect of the right breast measuring  approximately 2 cm. More superiorly and medially there is a longitudinal scar and just superior to that there also is a palpable mass which the patient tells me has been present since her 2005 surgery. I do not palpate any axillary adenopathy. The left breast is unremarkable as is the left axilla.   LAB RESULTS:  CMP     Component Value Date/Time   Price 144 10/09/2016 1609   K 4.7 10/09/2016 1609   CL 101 09/18/2016 0834   CO2 27 10/09/2016 1609   GLUCOSE 81 10/09/2016 1609   BUN 20.2 10/09/2016 1609   CREATININE 1.4 (H) 10/09/2016 1609   CALCIUM 10.9 (H) 10/09/2016 1609   PROT 8.0 10/09/2016 1609   ALBUMIN 3.6 10/09/2016 1609   AST 15 10/09/2016 1609   ALT 15 10/09/2016 1609   ALKPHOS 76 10/09/2016 1609   BILITOT 0.46 10/09/2016 1609   GFRNONAA 42 (L) 09/18/2016 0834   GFRAA 48 (L) 09/18/2016 0834    No results found for: TOTALPROTELP, ALBUMINELP, A1GS, A2GS, BETS, BETA2SER, GAMS, MSPIKE, SPEI  No results found for: Nils Pyle, [facility]  Lab Results  Component Value Date   WBC 6.5 10/09/2016   NEUTROABS 2.4 10/09/2016   HGB 15.6 10/09/2016   HCT 46.1 10/09/2016   MCV 94.1 10/09/2016   PLT 192 10/09/2016      Chemistry      Component Value Date/Time   Price 144 10/09/2016 1609   K 4.7 10/09/2016 1609   CL 101 09/18/2016 0834   CO2 27 10/09/2016 1609   BUN 20.2 10/09/2016 1609   CREATININE 1.4 (H) 10/09/2016 1609   GLU 104 02/13/2016 1358      Component Value Date/Time   CALCIUM 10.9 (H) 10/09/2016 1609   ALKPHOS 76 10/09/2016 1609   AST 15 10/09/2016 1609   ALT 15 10/09/2016 1609   BILITOT 0.46 10/09/2016 1609       Lab Results  Component Value Date   LABCA2 11 04/20/2008    No components found for: [id number]  No results for input(s): INR in the last 168 hours.  Urinalysis    Component Value Date/Time   COLORURINE YELLOW 01/27/2016 1028   APPEARANCEUR CLEAR 01/27/2016 1028   LABSPEC 1.028 01/27/2016 1028   PHURINE 5.5 01/27/2016  1028   GLUCOSEU NEGATIVE 01/27/2016 1028   GLUCOSEU NEG mg/dL 07/23/2006 2113   HGBUR NEGATIVE 01/27/2016 1028   BILIRUBINUR NEGATIVE 01/27/2016 1028   KETONESUR NEGATIVE 01/27/2016 1028   PROTEINUR NEGATIVE 01/27/2016 1028   UROBILINOGEN  1 07/23/2006 2113   NITRITE NEGATIVE 01/27/2016 1028   LEUKOCYTESUR NEGATIVE 01/27/2016 1028     STUDIES: Mm Digital Screening Bilateral  Result Date: 09/25/2016 CLINICAL DATA:  Screening. EXAM: DIGITAL SCREENING BILATERAL MAMMOGRAM WITH CAD COMPARISON:  Previous exam(s). ACR Breast Density Category b: There are scattered areas of fibroglandular density. FINDINGS: In the right breast, a possible mass warrants further evaluation. In the left breast, no findings suspicious for malignancy. Images were processed with CAD. IMPRESSION: Further evaluation is suggested for possible mass in the right breast. RECOMMENDATION: 3D diagnostic mammogram and possibly ultrasound of the right breast. (Code:[id number]) The patient will be contacted regarding the findings, and additional imaging will be scheduled. BI-RADS CATEGORY  0: Incomplete. Need additional imaging evaluation and/or prior mammograms for comparison. Electronically Signed   By: Evangeline Dakin M.D.   On: 09/25/2016 12:12   US Breast Ltd Uni Right Inc Axilla  Result Date: 09/28/2016 CLINICAL DATA:  61 year old female callback from screening mammogram for a mass in the right breast. Patient does have a personal history of right-sided breast cancer in 2005 status post lumpectomy followed by radiation therapy and chemotherapy. EXAM: 2D DIGITAL DIAGNOSTIC RIGHT MAMMOGRAM WITH ADJUNCT TOMO ULTRASOUND RIGHT BREAST COMPARISON:  Previous exam(s). ACR Breast Density Category a: The breast tissue is almost entirely fatty. FINDINGS: Follow-up 2D and 3D spot compression images of the right breast demonstrates persistence of an irregular, high-density mass at approximately 10 o'clock measuring 2.5 cm. Targeted ultrasound is  performed, showing an irregular, hypoechoic angulated mass in the right breast at 10 o'clock, 10 cm from the nipple with significant internal vascularity measuring 1.8 x 1.7 x 2.4 cm. This correlates well with the mass identified on mammography and is suspicious for malignancy. Interrogation of the right axilla demonstrates an abnormal, rounded lymph node measuring 1.2 x 1.3 x 1.2 cm. Additional normal appearing lymph nodes are noted in the right axilla. IMPRESSION: 2.5 cm suspicious mass at 10 o'clock in the right breast with an associated abnormal right axillary lymph node RECOMMENDATION: Recommend ultrasound-guided needle core biopsy of the mass in the right breast at 10 o'clock in the abnormal right axillary lymph node. I have discussed the findings and recommendations with the patient. Results were also provided in writing at the conclusion of the visit. If applicable, a reminder letter will be sent to the patient regarding the next appointment. BI-RADS CATEGORY  5: Highly suggestive of malignancy. Electronically Signed   By: Trude Mcburney M.D.   On: 09/28/2016 14:35   Mm Diag Breast Tomo Uni Right  Result Date: 09/28/2016 CLINICAL DATA:  61 year old female callback from screening mammogram for a mass in the right breast. Patient does have a personal history of right-sided breast cancer in 2005 status post lumpectomy followed by radiation therapy and chemotherapy. EXAM: 2D DIGITAL DIAGNOSTIC RIGHT MAMMOGRAM WITH ADJUNCT TOMO ULTRASOUND RIGHT BREAST COMPARISON:  Previous exam(s). ACR Breast Density Category a: The breast tissue is almost entirely fatty. FINDINGS: Follow-up 2D and 3D spot compression images of the right breast demonstrates persistence of an irregular, high-density mass at approximately 10 o'clock measuring 2.5 cm. Targeted ultrasound is performed, showing an irregular, hypoechoic angulated mass in the right breast at 10 o'clock, 10 cm from the nipple with significant internal vascularity  measuring 1.8 x 1.7 x 2.4 cm. This correlates well with the mass identified on mammography and is suspicious for malignancy. Interrogation of the right axilla demonstrates an abnormal, rounded lymph node measuring 1.2 x 1.3 x 1.2 cm. Additional normal  appearing lymph nodes are noted in the right axilla. IMPRESSION: 2.5 cm suspicious mass at 10 o'clock in the right breast with an associated abnormal right axillary lymph node RECOMMENDATION: Recommend ultrasound-guided needle core biopsy of the mass in the right breast at 10 o'clock in the abnormal right axillary lymph node. I have discussed the findings and recommendations with the patient. Results were also provided in writing at the conclusion of the visit. If applicable, a reminder letter will be sent to the patient regarding the next appointment. BI-RADS CATEGORY  5: Highly suggestive of malignancy. Electronically Signed   By: Trude Mcburney M.D.   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 This was found to be concordant by Dr. Ammie Ferrier. Pathology results were discussed with the patient by telephone. The patient reported doing well after the biopsies with tenderness at the sites. Post biopsy instructions and care were reviewed and questions were answered. The patient was encouraged to call [facility] for any additional concerns. Surgical consultation has been arranged with Dr. Fanny Skates at [facility] on October 03, 2016. Pathology results reported by Susa Raring RN, BSN on 10/03/2016. Electronically Signed   By: Ammie Ferrier M.D.   On: 10/03/2016 08:07   Result Date: 10/03/2016 CLINICAL DATA:  61 year old female presenting for  ultrasound-guided biopsy of a right breast mass and a right axillary lymph node. EXAM: ULTRASOUND GUIDED RIGHT BREAST CORE NEEDLE BIOPSY COMPARISON:  Previous exam(s). FINDINGS: I met with the patient and we discussed the procedure of ultrasound-guided biopsy, including benefits and alternatives. We discussed the high likelihood of a successful procedure. We discussed the risks of the procedure, including infection, bleeding, tissue injury, clip migration, and inadequate sampling. Informed written consent was given. The usual time-out protocol was performed immediately prior to the procedure. Lesion quadrant: Upper-outer Using sterile technique and 1% Lidocaine as local anesthetic, under direct ultrasound visualization, a 14 gauge spring-loaded device was used to perform biopsy of a right breast mass at 10 o'clock using an inferior approach. At the conclusion of the procedure a ribbon shaped tissue marker clip was deployed into the biopsy cavity. Using sterile technique and 1% Lidocaine as local anesthetic, under direct ultrasound visualization, a 14 gauge spring-loaded device was used to perform biopsy of right axillary lymph node using an inferior approach. At the conclusion of the procedure a spiral shaped tissue marker clip was deployed into the biopsy cavity. Follow up 2 view mammogram was performed and dictated separately. IMPRESSION: 1. Ultrasound guided biopsy of right breast mass at 10 o'clock. No apparent complications. 2. Ultrasound-guided biopsy of a right axillary lymph node. No apparent complications. Electronically Signed: By: Ammie Ferrier M.D. On: 10/01/2016 12:55   Mm Clip Placement Right  Result Date: 10/01/2016 CLINICAL DATA:  Post biopsy mammogram of the right breast for clip placement. EXAM: DIAGNOSTIC RIGHT MAMMOGRAM POST ULTRASOUND BIOPSY COMPARISON:  Previous exam(s). FINDINGS: Mammographic images were obtained following ultrasound guided biopsy of a right breast mass and right  axillary lymph node. The ribbon shaped biopsy marking clip is seen within the biopsied mass at 10 o'clock in the right breast. Due to its deep positioning within the axilla, the spiral shaped biopsy marking clip cannot be visualized. IMPRESSION: 1. The  ribbon shaped biopsy marking clip is located within the biopsied mass in the upper-outer right breast. 2. Nonvisualization of the spiral shaped biopsy marking clip within the right axillary lymph node due to its deep positioning. Final Assessment: Post Procedure Mammograms for Marker Placement Electronically Signed   By: Ammie Ferrier M.D.   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 This was found to be concordant by Dr. Ammie Ferrier. Pathology results were discussed with the patient by telephone. The patient reported doing well after the biopsies with tenderness at the sites. Post biopsy instructions and care were reviewed and questions were answered. The patient was encouraged to call [facility] for any additional concerns. Surgical consultation has been arranged with Dr. Fanny Skates at [facility] on October 03, 2016. Pathology results reported by Susa Raring RN, BSN on 10/03/2016. Electronically Signed   By: Ammie Ferrier M.D.   On: 10/03/2016 08:07   Result Date: 10/03/2016 CLINICAL DATA:  61 year old female presenting for ultrasound-guided biopsy of a right breast mass and a right axillary lymph node. EXAM: ULTRASOUND GUIDED RIGHT BREAST CORE NEEDLE BIOPSY COMPARISON:  Previous exam(s). FINDINGS: I met with the patient and we discussed the procedure of ultrasound-guided biopsy, including benefits and alternatives. We  discussed the high likelihood of a successful procedure. We discussed the risks of the procedure, including infection, bleeding, tissue injury, clip migration, and inadequate sampling. Informed written consent was given. The usual time-out protocol was performed immediately prior to the procedure. Lesion quadrant: Upper-outer Using sterile technique and 1% Lidocaine as local anesthetic, under direct ultrasound visualization, a 14 gauge spring-loaded device was used to perform biopsy of a right breast mass at 10 o'clock using an inferior approach. At the conclusion of the procedure a ribbon shaped tissue marker clip was deployed into the biopsy cavity. Using sterile technique and 1% Lidocaine as local anesthetic, under direct ultrasound visualization, a 14 gauge spring-loaded device was used to perform biopsy of right axillary lymph node using an inferior approach. At the conclusion of the procedure a spiral shaped tissue marker clip was deployed into the biopsy cavity. Follow up 2 view mammogram was performed and dictated separately. IMPRESSION: 1. Ultrasound guided biopsy of right breast mass at 10 o'clock. No apparent complications. 2. Ultrasound-guided biopsy of a right axillary lymph node. No apparent complications. Electronically Signed: By: Ammie Ferrier M.D. On: 10/01/2016 12:55    ELIGIBLE FOR AVAILABLE RESEARCH PROTOCOL: no  ASSESSMENT: 61 y.o. [place] woman   (1) status post right lumpectomy and sentinel lymph node sampling 2005 for and invasive breast cancer, treated with chemotherapy and radiation, details pending  (2) status post right breast upper outer quadrant biopsy and right axillary lymph node biopsy, both positive for a T2 N1, stage IIIB invasive ductal carcinoma, triple negative, with an MIB-1 of 50-70%  (3) definitive surgery pending  (4) chemotherapy to follow surgery  PLAN: We spent the better part of today's hour-long appointment discussing the biology of breast cancer  in general, and the specifics of the patient's tumor in particular. Kayla Price understands in general if the cancer occurs in the same breast more than 10 years then an earlier one we think this is  a new one. She understands under the new calcifications system this is a stage IIIB breast cancer. It is very aggressive and fast growing.   We discussed the 5 treatment options available to treat any breast cancer. In terms of local treatment these are surgery and radiation. Because she has had prior radiation to the right breast, further radiation is not an option and this means she will need a mastectomy on the right. Note that she a ray had prior sentinel lymph node sampling and the optimal management of her axilla at this point may involve a full axillary dissection  In terms of systemic treatment there are 3 options, namely anti-estrogens, anti-HER-2 immunotherapy, and chemotherapy. In triple negative tumors like this one the first 2 options do not apply. Her only systemic therapy therefore is chemotherapy and this will proceed after she recovers from her surgery. She understands she will need a port  We did not discuss specific chemotherapy agents because I need to review her prior treatments, which I do not have available today. I anticipate we will be starting chemotherapy 3 weeks or so after her surgery. Accordingly I am making her a return appointment here in the second half of May by which time we should be ready to make a definitive chemotherapy decision  Kayla Price has a good understanding of the overall plan. She agrees with it. She knows the goal of treatment in her case is cure. She will call with any problems that may develop before her next visit here.  Chauncey Cruel, MD   10/09/2016 5:32 PM Medical Oncology and Hematology [facility] [address] [place], [place] [address] Tel. [phone]    Fax. [phone]

## 2016-10-10 ENCOUNTER — Telehealth: Payer: Self-pay | Admitting: Oncology

## 2016-10-10 NOTE — Telephone Encounter (Signed)
lvm to inform pt of lab/MD appt  5/18 at 1030 am per LOS

## 2016-10-11 NOTE — Progress Notes (Signed)
Location of Breast Cancer: Right Breast  Histology per Pathology Report:  10/01/16 Diagnosis 1. Breast, right, needle core biopsy, 10:00 o'clock - INVASIVE DUCTAL CARCINOMA. - DUCTAL CARCINOMA IN SITU. - SEE COMMENT.  1. Receptor status: ER (NEG), PR (NEG), Her2-neu (NEG), Ki- (70%)  2. Lymph node, needle/core biopsy, right axilla - POSITIVE FOR DUCTAL CARCINOMA.  2. Receptor Status: ER(NEG), PR (NEG), Her2-neu (NEG), Ki-(50%)  Did patient present with symptoms or was this found on screening mammography?: It was found on a screening mammogram, however Dr. Jana Hakim documents on 10/09/16: Tynika has a history of right-sided breast cancer dating back to 2005. She had a lumpectomy with sentinel lymph node sampling, chemotherapy, and radiation.  Past/Anticipated interventions by surgeon, if any: 10/03/16 Dr. Dalbert Batman and addendum note from 10/10/16 The recommendation was to refer her immediately to radiation oncology and they can look up her prior radiation fields to decide whether the axilla can be radiated. If the axilla can be radiated we will consider a more conservative, targeted right axillary lymph node dissection.    Past/Anticipated interventions by medical oncology, if any:  Dr. Jana Hakim 10/09/16 PLAN: We spent the better part of today's hour-long appointment discussing the biology of breast cancer in general, and the specifics of the patient's tumor in particular. Aryiah understands in general if the cancer occurs in the same breast more than 10 years then an earlier one we think this is a new one. She understands under the new calcifications system this is a stage IIIB breast cancer. It is very aggressive and fast growing.   We discussed the 5 treatment options available to treat any breast cancer. In terms of local treatment these are surgery and radiation. Because she has had prior radiation to the right breast, further radiation is not an option and this means she will need a  mastectomy on the right. Note that she a ray had prior sentinel lymph node sampling and the optimal management of her axilla at this point may involve a full axillary dissection  In terms of systemic treatment there are 3 options, namely anti-estrogens, anti-HER-2 immunotherapy, and chemotherapy. In triple negative tumors like this one the first 2 options do not apply. Her only systemic therapy therefore is chemotherapy and this will proceed after she recovers from her surgery. She understands she will need a port  We did not discuss specific chemotherapy agents because I need to review her prior treatments, which I do not have available today. I anticipate we will be starting chemotherapy 3 weeks or so after her surgery. Accordingly I am making her a return appointment here in the second half of May by which time we should be ready to make a definitive chemotherapy decision  Lymphedema issues, if any:  N/A  Pain issues, if any:  She reports occasional sharp pains to her recent biopsy site.   SAFETY ISSUES:  Prior radiation? Yes, Right Breast, supraclavicular fossa and posterior axilla (per CT sim notes, Dr Tammi Klippel 11/13/2004)  Pacemaker/ICD? No  Possible current pregnancy? No  Is the patient on methotrexate? No  Current Complaints / other details:    BP 132/83   Pulse 79   Temp 97.9 F (36.6 C)   Ht '5\' 7"'  (1.702 m)   Wt 251 lb 6.4 oz (114 kg)   SpO2 100% Comment: room air  BMI 39.37 kg/m    Wt Readings from Last 3 Encounters:  10/12/16 251 lb 6.4 oz (114 kg)  10/09/16 265 lb (120.2 kg)  09/20/16  266 lb (120.7 kg)      Moss Berry, Stephani Police, RN 10/11/2016,8:46 AM

## 2016-10-12 ENCOUNTER — Ambulatory Visit
Admission: RE | Admit: 2016-10-12 | Discharge: 2016-10-12 | Disposition: A | Discharge status: Home / Self Care | Payer: Medicare HMO | Source: Ambulatory Visit | Attending: Radiation Oncology | Admitting: Radiation Oncology

## 2016-10-12 ENCOUNTER — Encounter: Payer: Self-pay | Admitting: Radiation Oncology

## 2016-10-12 DIAGNOSIS — B20 Human immunodeficiency virus [HIV] disease: Secondary | ICD-10-CM | POA: Diagnosis not present

## 2016-10-12 DIAGNOSIS — N183 Chronic kidney disease, stage 3 (moderate): Secondary | ICD-10-CM | POA: Diagnosis not present

## 2016-10-12 DIAGNOSIS — Z923 Personal history of irradiation: Secondary | ICD-10-CM | POA: Diagnosis not present

## 2016-10-12 DIAGNOSIS — I129 Hypertensive chronic kidney disease with stage 1 through stage 4 chronic kidney disease, or unspecified chronic kidney disease: Secondary | ICD-10-CM | POA: Diagnosis not present

## 2016-10-12 DIAGNOSIS — E039 Hypothyroidism, unspecified: Secondary | ICD-10-CM | POA: Diagnosis not present

## 2016-10-12 DIAGNOSIS — I739 Peripheral vascular disease, unspecified: Secondary | ICD-10-CM | POA: Diagnosis not present

## 2016-10-12 DIAGNOSIS — C50411 Malignant neoplasm of upper-outer quadrant of right female breast: Secondary | ICD-10-CM

## 2016-10-12 DIAGNOSIS — G629 Polyneuropathy, unspecified: Secondary | ICD-10-CM | POA: Diagnosis not present

## 2016-10-12 DIAGNOSIS — Z171 Estrogen receptor negative status [ER-]: Principal | ICD-10-CM

## 2016-10-12 DIAGNOSIS — E559 Vitamin D deficiency, unspecified: Secondary | ICD-10-CM | POA: Diagnosis not present

## 2016-10-12 DIAGNOSIS — E782 Mixed hyperlipidemia: Secondary | ICD-10-CM | POA: Diagnosis not present

## 2016-10-12 DIAGNOSIS — Z79899 Other long term (current) drug therapy: Secondary | ICD-10-CM | POA: Insufficient documentation

## 2016-10-12 DIAGNOSIS — Z853 Personal history of malignant neoplasm of breast: Secondary | ICD-10-CM | POA: Diagnosis not present

## 2016-10-12 NOTE — Progress Notes (Signed)
Radiation Oncology         [phone] ________________________________  Initial outpatient Consultation  Name: Kayla Price MRN: [id number]  Date: 10/12/2016  DOB: 09/20/1955  UY:[id number], Carlos American, NP  Magrinat, Virgie Dad, MD   REFERRING PHYSICIAN: Magrinat, Virgie Dad, MD  DIAGNOSIS:    ICD-9-CM ICD-10-CM   1. Malignant neoplasm of upper-outer quadrant of right breast in female, estrogen receptor negative ([place]) 174.4 C50.411    V86.1 Z17.1    Clinical Stage II  (T2N1M0) Right Breast UOQ Invasive Ductal Carcinoma, ER- / PR- / Her2-, Grade 3  CHIEF COMPLAINT: Here to discuss management of right breast cancer  HISTORY OF PRESENT ILLNESS::Kayla Price is a 61 y.o. female who has a previous history of right-sided breast cancer dating back to 2005. She had a lumpectomy with sentinel lymph node sampling, chemotherapy, and radiation. We are in the process of acquiring her records.  The patient had a bilateral mammogram on 09/25/16 showing a possible mass in the right breast. She had occasional twinges that were not painful prior to her mammogram and she also self palpated a small mass before that time. Diagnostic right breast mammogram on 09/28/16 showed a 2.5 cm mass in the 10:00 position. [place] at the time showed a 1.8 x 1.7 x 2.4 cm mass in the 10:00 position 10 cm from the nipple. US of the right axilla showed a 1.2 x 1.3 x 1.2 cm abnormal lymph node.  Biopsies were performed on 10/01/16. Biopsy of the 10:00 right breast mass revealed grade 3 invasive ductal carcinoma and DCIS. ER 0% negative, PR 0% negative, HER2 negative Ki67 70% Biopsy of the abnormal right axilla was positive for ductal carcinoma. ER 0% negative, PR 0% negative, HER2 negative Ki67 50%  The patient saw Dr. Jana Hakim on 10/09/16 who discussed systemic therapy, but was not able to discuss specific chemotherapy agents because he needs to review her prior breast cancer treatments. He anticipates starting chemotherapy  approximately 3 weeks after surgery.  The patient presents today to discuss the role of radiation for the management of her disease.  On ROS: The patient has arthritic pain, left knee soreness from a recent surgery, seasonal allergies, and chronic peripheral neuropathy from her cancer treatment years in both her hands and feet.  PREVIOUS RADIATION THERAPY: Yes.  May 2006: 50.4 Gy in 28 fractions to the right breast, right supraclavicular nodes, and right axillary nodes by Dr. Tammi Klippel through the posterior axilla. She also had a 10 Gy boost to the lumpectomy cavity. I reviewed her old fields, dosimetry.  PAST MEDICAL HISTORY:  has a past medical history of Alopecia areata (11/28/2009); Bell's palsy; BREAST CANCER, HX OF (2005); Cancer ([place]) (2005); CKD (chronic kidney disease) stage 3, GFR 30-59 ml/min (06/08/2015); Dry eye syndrome; Fasting hyperglycemia; HIP FRACTURE, RIGHT (05/06/2008); HIV (human immunodeficiency virus infection) ([place]); HIV DISEASE (03/27/2006); HYPERLIPIDEMIA, MIXED (12/15/2007); HYPERTENSION (03/27/2006); HYPOTHYROIDISM, POST-RADIATION (06/28/2008); MENORRHAGIA, POSTMENOPAUSAL (02/03/2009); Osteoarthritis of left knee (06/08/2015); OSTEOARTHROSIS, LOCAL, SCND, UNSPC SITE (04/07/2007); PVD (04/07/2007); Tinea capitis; TRIGGER FINGER (05/06/2008); and Unspecified vitamin D deficiency (08/06/2007).    PAST SURGICAL HISTORY: Past Surgical History:  Procedure Laterality Date  . BREAST LUMPECTOMY Right    2005  . COLONOSCOPY    . HYSTEROSCOPY  2006  . KNEE ARTHROSCOPY Left 2002  . LYMPH NODE DISSECTION  2005  . placement   of port-a-cath    . PORT-A-CATH REMOVAL  2006   insertion 2005  . removal of port a cath  12/2014  .  THYROID SURGERY  2009   Ablation   . TOTAL KNEE ARTHROPLASTY Left 02/06/2016   Procedure: TOTAL KNEE ARTHROPLASTY;  Surgeon: Frederik Pear, MD;  Location: [place] [facility];  Service: Orthopedics;  Laterality: Left;  . TUBAL LIGATION      FAMILY HISTORY: family history  includes Arthritis in her father; COPD in her sister; Cancer in her cousin and paternal uncle; Diabetes in her mother and sister; Heart attack in her brother; Heart disease in her father; Heart failure in her father; Kidney disease in her mother; Liver disease in her sister; Sarcoidosis in her sister; Stroke in her brother, mother, and sister.  SOCIAL HISTORY:  reports that she has never smoked. She has never used smokeless tobacco. She reports that she does not drink alcohol or use drugs.  ALLERGIES: Lisinopril  MEDICATIONS:  Current Outpatient Prescriptions  Medication Sig Dispense Refill  . co-enzyme Q-10 30 MG capsule Take 1 tablet by mouth daily.    . darunavir-cobicistat (PREZCOBIX) 800-150 MG tablet TAKE 1 TABLET BY MOUTH DAILY. SWALLOW WHOLE. DO NOT CRUSH, BREAK OR CHEW TABLETS. TAKE WITH FOOD. 30 tablet 11  . dolutegravir (TIVICAY) 50 MG tablet Take 1 tablet (50 mg total) by mouth daily. 30 tablet 11  . HYDROcodone-acetaminophen (NORCO/VICODIN) 5-325 MG tablet Take 1 tablet by mouth every 6 (six) hours as needed for moderate pain. 120 tablet 0  . levothyroxine (SYNTHROID, LEVOTHROID) 175 MCG tablet Take 1 tablet (175 mcg total) by mouth daily before breakfast. 90 tablet 1  . loratadine (CLARITIN) 10 MG tablet Take 10 mg by mouth daily as needed for itching. Reported on 06/08/2015    . methocarbamol (ROBAXIN) 500 MG tablet Take 500 mg by mouth every 8 (eight) hours as needed for muscle spasms.    Marland Kitchen olmesartan-hydrochlorothiazide (BENICAR HCT) 40-25 MG tablet TAKE 1 TABLET EVERY DAY 90 tablet 1  . Pitavastatin Calcium 4 MG TABS Take 1 tablet (4 mg total) by mouth daily. This may be placebo, study provided. Do not dispense. 30 tablet 11  . rilpivirine (EDURANT) 25 MG TABS tablet TAKE 1 TABLET BY MOUTH EVERY DAY WITH BREAKFAST 30 tablet 11  . tiZANidine (ZANAFLEX) 2 MG tablet Take 1 tablet (2 mg total) by mouth every 6 (six) hours as needed for muscle spasms. (Patient not taking: Reported on  10/12/2016) 60 tablet 0   No current facility-administered medications for this encounter.     REVIEW OF SYSTEMS: A 10+ POINT REVIEW OF SYSTEMS WAS OBTAINED including neurology, dermatology, psychiatry, cardiac, respiratory, lymph, extremities, GI, GU, Musculoskeletal, constitutional, breasts, reproductive, HEENT.  All pertinent positives are noted in the HPI.  All others are negative.   PHYSICAL EXAM:  height is _0  (1.702 m) and weight is 251 lb 6.4 oz (114 kg). Her temperature is 97.9 F (36.6 C). Her blood pressure is 132/83 and her pulse is 79. Her oxygen saturation is 100%.   General: Alert and oriented, in no acute distress HEENT: Head is normocephalic. Extraocular movements are intact. Oropharynx is clear. Neck: Neck is supple, no palpable cervical or supraclavicular lymphadenopathy. Heart: Regular in rate and rhythm with no murmurs, rubs, or gallops. Chest: Clear to auscultation bilaterally, with no rhonchi, wheezes, or rales. Abdomen: Soft, nontender, nondistended, with no rigidity or guarding. Extremities: No cyanosis or edema. Lymphatics: see Neck Exam Skin: No concerning lesions. Musculoskeletal: symmetric strength and muscle tone throughout. Neurologic: Cranial nerves II through XII are grossly intact. No obvious focalities. Speech is fluent. Coordination is intact. Psychiatric: Judgment and insight  are intact. Affect is appropriate. Breasts: On right breast exam, she has a mass underneath her prior lumpectomy scar at 1:00. She reports this is chronic since her breast treatment since 2006. Palpable mass about 4 cm in greatest dimension at 10:00, also a 1.5 mass appreciated in the right axilla. No other palpable masses appreciated in the breasts or axillae b/l  ECOG = 1  0 - Asymptomatic (Fully active, able to carry on all predisease activities without restriction)  1 - Symptomatic but completely ambulatory (Restricted in physically strenuous activity but ambulatory and able  to carry out work of a light or sedentary nature. For example, light housework, office work)  2 - Symptomatic, <50% in bed during the day (Ambulatory and capable of all self care but unable to carry out any work activities. Up and about more than 50% of waking hours)  3 - Symptomatic, >50% in bed, but not bedbound (Capable of only limited self-care, confined to bed or chair 50% or more of waking hours)  4 - Bedbound (Completely disabled. Cannot carry on any self-care. Totally confined to bed or chair)  5 - Death   Eustace Pen MM, Creech RH, Tormey DC, et al. [phone]). "Toxicity and response criteria of the [facility]". [place] Oncol. 5 (6): 649-55   LABORATORY DATA:  Lab Results  Component Value Date   WBC 6.5 10/09/2016   HGB 15.6 10/09/2016   HCT 46.1 10/09/2016   MCV 94.1 10/09/2016   PLT 192 10/09/2016   CMP     Component Value Date/Time   NA 144 10/09/2016 1609   K 4.7 10/09/2016 1609   CL 101 09/18/2016 0834   CO2 27 10/09/2016 1609   GLUCOSE 81 10/09/2016 1609   BUN 20.2 10/09/2016 1609   CREATININE 1.4 (H) 10/09/2016 1609   CALCIUM 10.9 (H) 10/09/2016 1609   PROT 8.0 10/09/2016 1609   ALBUMIN 3.6 10/09/2016 1609   AST 15 10/09/2016 1609   ALT 15 10/09/2016 1609   ALKPHOS 76 10/09/2016 1609   BILITOT 0.46 10/09/2016 1609   GFRNONAA 42 (L) 09/18/2016 0834   GFRAA 48 (L) 09/18/2016 0834         RADIOGRAPHY: Mm Digital Screening Bilateral  Result Date: 09/25/2016 CLINICAL DATA:  Screening. EXAM: DIGITAL SCREENING BILATERAL MAMMOGRAM WITH CAD COMPARISON:  Previous exam(s). ACR Breast Density Category b: There are scattered areas of fibroglandular density. FINDINGS: In the right breast, a possible mass warrants further evaluation. In the left breast, no findings suspicious for malignancy. Images were processed with CAD. IMPRESSION: Further evaluation is suggested for possible mass in the right breast. RECOMMENDATION: 3D diagnostic mammogram and  possibly ultrasound of the right breast. (Code:[id number]) The patient will be contacted regarding the findings, and additional imaging will be scheduled. BI-RADS CATEGORY  0: Incomplete. Need additional imaging evaluation and/or prior mammograms for comparison. Electronically Signed   By: Evangeline Dakin M.D.   On: 09/25/2016 12:12   US Breast Ltd Uni Right Inc Axilla  Result Date: 09/28/2016 CLINICAL DATA:  61 year old female callback from screening mammogram for a mass in the right breast. Patient does have a personal history of right-sided breast cancer in 2005 status post lumpectomy followed by radiation therapy and chemotherapy. EXAM: 2D DIGITAL DIAGNOSTIC RIGHT MAMMOGRAM WITH ADJUNCT TOMO ULTRASOUND RIGHT BREAST COMPARISON:  Previous exam(s). ACR Breast Density Category a: The breast tissue is almost entirely fatty. FINDINGS: Follow-up 2D and 3D spot compression images of the right breast demonstrates persistence of an irregular,  high-density mass at approximately 10 o'clock measuring 2.5 cm. Targeted ultrasound is performed, showing an irregular, hypoechoic angulated mass in the right breast at 10 o'clock, 10 cm from the nipple with significant internal vascularity measuring 1.8 x 1.7 x 2.4 cm. This correlates well with the mass identified on mammography and is suspicious for malignancy. Interrogation of the right axilla demonstrates an abnormal, rounded lymph node measuring 1.2 x 1.3 x 1.2 cm. Additional normal appearing lymph nodes are noted in the right axilla. IMPRESSION: 2.5 cm suspicious mass at 10 o'clock in the right breast with an associated abnormal right axillary lymph node RECOMMENDATION: Recommend ultrasound-guided needle core biopsy of the mass in the right breast at 10 o'clock in the abnormal right axillary lymph node. I have discussed the findings and recommendations with the patient. Results were also provided in writing at the conclusion of the visit. If applicable, a reminder letter will  be sent to the patient regarding the next appointment. BI-RADS CATEGORY  5: Highly suggestive of malignancy. Electronically Signed   By: Trude Mcburney M.D.   On: 09/28/2016 14:35   Mm Diag Breast Tomo Uni Right  Result Date: 09/28/2016 CLINICAL DATA:  61 year old female callback from screening mammogram for a mass in the right breast. Patient does have a personal history of right-sided breast cancer in 2005 status post lumpectomy followed by radiation therapy and chemotherapy. EXAM: 2D DIGITAL DIAGNOSTIC RIGHT MAMMOGRAM WITH ADJUNCT TOMO ULTRASOUND RIGHT BREAST COMPARISON:  Previous exam(s). ACR Breast Density Category a: The breast tissue is almost entirely fatty. FINDINGS: Follow-up 2D and 3D spot compression images of the right breast demonstrates persistence of an irregular, high-density mass at approximately 10 o'clock measuring 2.5 cm. Targeted ultrasound is performed, showing an irregular, hypoechoic angulated mass in the right breast at 10 o'clock, 10 cm from the nipple with significant internal vascularity measuring 1.8 x 1.7 x 2.4 cm. This correlates well with the mass identified on mammography and is suspicious for malignancy. Interrogation of the right axilla demonstrates an abnormal, rounded lymph node measuring 1.2 x 1.3 x 1.2 cm. Additional normal appearing lymph nodes are noted in the right axilla. IMPRESSION: 2.5 cm suspicious mass at 10 o'clock in the right breast with an associated abnormal right axillary lymph node RECOMMENDATION: Recommend ultrasound-guided needle core biopsy of the mass in the right breast at 10 o'clock in the abnormal right axillary lymph node. I have discussed the findings and recommendations with the patient. Results were also provided in writing at the conclusion of the visit. If applicable, a reminder letter will be sent to the patient regarding the next appointment. BI-RADS CATEGORY  5: Highly suggestive of malignancy. Electronically Signed   By: Trude Mcburney  M.D.   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 This was found to be concordant by Dr. Ammie Ferrier. Pathology results were discussed with the patient by telephone. The patient reported doing well after the biopsies with tenderness at the sites. Post biopsy instructions and care were reviewed and questions were answered. The patient was encouraged to call [facility] for any additional concerns. Surgical consultation has been arranged with Dr. Fanny Skates at [facility] on October 03, 2016. Pathology results reported by Susa Raring RN, BSN on  10/03/2016. Electronically Signed   By: Ammie Ferrier M.D.   On: 10/03/2016 08:07   Result Date: 10/03/2016 CLINICAL DATA:  61 year old female presenting for ultrasound-guided biopsy of a right breast mass and a right axillary lymph node. EXAM: ULTRASOUND GUIDED RIGHT BREAST CORE NEEDLE BIOPSY COMPARISON:  Previous exam(s). FINDINGS: I met with the patient and we discussed the procedure of ultrasound-guided biopsy, including benefits and alternatives. We discussed the high likelihood of a successful procedure. We discussed the risks of the procedure, including infection, bleeding, tissue injury, clip migration, and inadequate sampling. Informed written consent was given. The usual time-out protocol was performed immediately prior to the procedure. Lesion quadrant: Upper-outer Using sterile technique and 1% Lidocaine as local anesthetic, under direct ultrasound visualization, a 14 gauge spring-loaded device was used to perform biopsy of a right breast mass at 10 o'clock using an inferior approach. At the conclusion of the procedure a ribbon shaped tissue marker clip  was deployed into the biopsy cavity. Using sterile technique and 1% Lidocaine as local anesthetic, under direct ultrasound visualization, a 14 gauge spring-loaded device was used to perform biopsy of right axillary lymph node using an inferior approach. At the conclusion of the procedure a spiral shaped tissue marker clip was deployed into the biopsy cavity. Follow up 2 view mammogram was performed and dictated separately. IMPRESSION: 1. Ultrasound guided biopsy of right breast mass at 10 o'clock. No apparent complications. 2. Ultrasound-guided biopsy of a right axillary lymph node. No apparent complications. Electronically Signed: By: Ammie Ferrier M.D. On: 10/01/2016 12:55   Mm Clip Placement Right  Result Date: 10/01/2016 CLINICAL DATA:  Post biopsy mammogram of the right breast for clip placement. EXAM: DIAGNOSTIC RIGHT MAMMOGRAM POST ULTRASOUND BIOPSY COMPARISON:  Previous exam(s). FINDINGS: Mammographic images were obtained following ultrasound guided biopsy of a right breast mass and right axillary lymph node. The ribbon shaped biopsy marking clip is seen within the biopsied mass at 10 o'clock in the right breast. Due to its deep positioning within the axilla, the spiral shaped biopsy marking clip cannot be visualized. IMPRESSION: 1. The ribbon shaped biopsy marking clip is located within the biopsied mass in the upper-outer right breast. 2. Nonvisualization of the spiral shaped biopsy marking clip within the right axillary lymph node due to its deep positioning. Final Assessment: Post Procedure Mammograms for Marker Placement Electronically Signed   By: Ammie Ferrier M.D.   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 This was  found to be concordant by Dr. Ammie Ferrier. Pathology results were discussed with the patient by telephone. The patient reported doing well after the biopsies with tenderness at the sites. Post biopsy instructions and care were reviewed and questions were answered. The patient was encouraged to call [facility] for any additional concerns. Surgical consultation has been arranged with Dr[facility] Fanny Skates at [facility] on October 03, 2016. Pathology results reported by Susa Raring RN, BSN on 10/03/2016. Electronically Signed   By: Ammie Ferrier M.D.   On: 10/03/2016 08:07   Result Date: 10/03/2016 CLINICAL DATA:  61 year old female presenting for ultrasound-guided biopsy of a right breast mass and a right axillary lymph node. EXAM: ULTRASOUND GUIDED RIGHT BREAST  CORE NEEDLE BIOPSY COMPARISON:  Previous exam(s). FINDINGS: I met with the patient and we discussed the procedure of ultrasound-guided biopsy, including benefits and alternatives. We discussed the high likelihood of a successful procedure. We discussed the risks of the procedure, including infection, bleeding, tissue injury, clip migration, and inadequate sampling. Informed written consent was given. The usual time-out protocol was performed immediately prior to the procedure. Lesion quadrant: Upper-outer Using sterile technique and 1% Lidocaine as local anesthetic, under direct ultrasound visualization, a 14 gauge spring-loaded device was used to perform biopsy of a right breast mass at 10 o'clock using an inferior approach. At the conclusion of the procedure a ribbon shaped tissue marker clip was deployed into the biopsy cavity. Using sterile technique and 1% Lidocaine as local anesthetic, under direct ultrasound visualization, a 14 gauge spring-loaded device was used to perform biopsy of right axillary lymph node using an inferior approach. At the conclusion of the procedure a spiral shaped tissue  marker clip was deployed into the biopsy cavity. Follow up 2 view mammogram was performed and dictated separately. IMPRESSION: 1. Ultrasound guided biopsy of right breast mass at 10 o'clock. No apparent complications. 2. Ultrasound-guided biopsy of a right axillary lymph node. No apparent complications. Electronically Signed: By: Ammie Ferrier M.D. On: 10/01/2016 12:55      IMPRESSION/PLAN: Clinical Stage IIB (T2N1) Right Breast UOQ Invasive Ductal Carcinoma, ER- / PR- / Her2-, Grade 3   Prior history of right breast cancer treated with surgery, chemotherapy, and RT in 2006  The patient received prior radiation to the right breast and right axilla in May 2006. I reviewed the patient's prior treatment fields with 50.4 Gy in 28 fractions to the right breast, right supraclavicular nodes, and right axillary nodes by Dr. Tammi Klippel through the posterior axilla. She also had a 10 Gy boost to the lumpectomy cavity. Given she received a comprehensive dose to the right breast and nodes, re-irradiation  to those areas would be high risk. I recommend she undergo a right mastectomy and right axillary node dissection in attempt to avoid re-irradiation. The patient will then proceed with systemic chemotherapy by Dr. Jana Hakim after surgery. It was a pleasure meeting the patient and she will see me on a PRN basis.    Patient's neighborhood recently affected by the [facility]  I listened to her recount this trauma and expressed my sympathy and emotional support. __________________________________________   Eppie Gibson, MD  This document serves as a record of services personally performed by Eppie Gibson, MD. It was created on her behalf by Darcus Austin, a trained medical scribe. The creation of this record is based on the scribe's personal observations and the provider's statements to them. This document has been checked and approved by the attending provider.

## 2016-10-18 ENCOUNTER — Other Ambulatory Visit: Payer: Self-pay | Admitting: General Surgery

## 2016-10-18 DIAGNOSIS — B20 Human immunodeficiency virus [HIV] disease: Secondary | ICD-10-CM | POA: Diagnosis not present

## 2016-10-18 DIAGNOSIS — Z6841 Body Mass Index (BMI) 40.0 and over, adult: Secondary | ICD-10-CM | POA: Diagnosis not present

## 2016-10-18 DIAGNOSIS — Z853 Personal history of malignant neoplasm of breast: Secondary | ICD-10-CM | POA: Diagnosis not present

## 2016-10-18 DIAGNOSIS — C50911 Malignant neoplasm of unspecified site of right female breast: Secondary | ICD-10-CM | POA: Diagnosis not present

## 2016-10-18 DIAGNOSIS — N183 Chronic kidney disease, stage 3 (moderate): Secondary | ICD-10-CM | POA: Diagnosis not present

## 2016-10-18 DIAGNOSIS — I10 Essential (primary) hypertension: Secondary | ICD-10-CM | POA: Diagnosis not present

## 2016-10-18 DIAGNOSIS — Z8639 Personal history of other endocrine, nutritional and metabolic disease: Secondary | ICD-10-CM | POA: Diagnosis not present

## 2016-10-23 ENCOUNTER — Other Ambulatory Visit: Payer: Self-pay

## 2016-10-23 MED ORDER — LEVOTHYROXINE SODIUM 175 MCG PO TABS: 175.0000 ug | ORAL_TABLET | Freq: Every day | ORAL | 1 refills | Status: DC

## 2016-10-24 MED FILL — PREZCOBIX 800 MG-150 MG TAB: 800-150 | 30 days supply | Qty: 30 | Fill #2

## 2016-10-24 MED FILL — TIVICAY 50 MG TABLET: 50 | 30 days supply | Qty: 30 | Fill #2

## 2016-10-24 MED FILL — EDURANT 25 MG TABS: 25 | 30 days supply | Qty: 30 | Fill #2

## 2016-10-25 ENCOUNTER — Other Ambulatory Visit: Payer: Self-pay

## 2016-10-25 MED ORDER — LEVOTHYROXINE SODIUM 175 MCG PO TABS: 175.0000 ug | ORAL_TABLET | Freq: Every day | ORAL | 1 refills | Status: DC

## 2016-10-31 ENCOUNTER — Encounter (HOSPITAL_COMMUNITY)
Admission: RE | Admit: 2016-10-31 | Discharge: 2016-10-31 | Disposition: A | Discharge status: Home / Self Care | Payer: Medicare HMO | Source: Ambulatory Visit | Attending: General Surgery | Admitting: General Surgery

## 2016-10-31 DIAGNOSIS — C50411 Malignant neoplasm of upper-outer quadrant of right female breast: Secondary | ICD-10-CM | POA: Insufficient documentation

## 2016-10-31 DIAGNOSIS — Z01812 Encounter for preprocedural laboratory examination: Secondary | ICD-10-CM | POA: Diagnosis not present

## 2016-10-31 DIAGNOSIS — Z171 Estrogen receptor negative status [ER-]: Secondary | ICD-10-CM | POA: Insufficient documentation

## 2016-10-31 LAB — CBC
HEMATOCRIT: 46.9 % — AB (ref 36.0–46.0)
HEMOGLOBIN: 16 g/dL — AB (ref 12.0–15.0)
MCH: 32.2 pg (ref 26.0–34.0)
MCHC: 34.1 g/dL (ref 30.0–36.0)
MCV: 94.4 fL (ref 78.0–100.0)
Platelets: 190 10*3/uL (ref 150–400)
RBC: 4.97 MIL/uL (ref 3.87–5.11)
RDW: 14.1 % (ref 11.5–15.5)
WBC: 5.4 10*3/uL (ref 4.0–10.5)

## 2016-10-31 LAB — BASIC METABOLIC PANEL
ANION GAP: 10 (ref 5–15)
BUN: 19 mg/dL (ref 6–20)
CHLORIDE: 103 mmol/L (ref 101–111)
CO2: 24 mmol/L (ref 22–32)
Calcium: 10.7 mg/dL — ABNORMAL HIGH (ref 8.9–10.3)
Creatinine, Ser: 1.36 mg/dL — ABNORMAL HIGH (ref 0.44–1.00)
GFR calc Af Amer: 48 mL/min — ABNORMAL LOW (ref >60)
GFR calc non Af Amer: 41 mL/min — ABNORMAL LOW (ref >60)
GLUCOSE: 87 mg/dL (ref 65–99)
POTASSIUM: 3.5 mmol/L (ref 3.5–5.1)
Sodium: 137 mmol/L (ref 135–145)

## 2016-10-31 NOTE — Pre-Procedure Instructions (Signed)
    Kayla Price  10/31/2016      [id number] - [place], [place] - [address] [place] [address] Phone: [phone] Fax: [phone]  [id number] [place] [address] [place]Phone: [phone]: [phone] [facility] - [address] [place]Phone: [phone]: [phone] [place] [facility] [address]Phone: [phone]: [phone]   Your procedure is scheduled on May 14.  [facility]  Call this number if you have problems the morning of surgery:  [phone]   Remember:  Do not eat food or drink liquids after midnight.  Take these medicines the morning of surgery with A SIP OF WATER :levothyroxine (SYNTHROID, LEVOTHROID),  HYDROcodone-acetaminophen (NORCO/VICODIN)/methocarbamol (ROBAXIN) if needed   STOP aspirin, herbal medications, vitamins, advil, aleve, ibuprofen, fish oil as of today   Do not wear jewelry, make-up or nail polish.  Do not wear lotions, powders, or perfumes, or deoderant.  Do not shave 48 hours prior to surgery.  Men may shave face and neck.  Do not bring valuables to the hospital.  [facility] belongings or [facility]ables.  Contacts, dentures or bridgework may not be worn into surgery.  Leave your suitcase in the car.  After surgery it may be brought to your room.  For patients admitted to the hospital, discharge time will be determined by your treatment team.  Patients discharged the day of surgery will not be allowed to drive home.   Name and phone number of your driver:    Special instructions:  Preparing for surgery  Please read  over the following fact sheets that you were given. Pain Booklet and Surgical Site Infection Prevention

## 2016-11-02 MED ORDER — ACETAMINOPHEN 500 MG PO TABS
1000.0000 mg | ORAL_TABLET | ORAL | Status: AC
  Administered 2016-11-05: 1000 mg via ORAL
  Filled 2016-11-02: qty 2

## 2016-11-02 MED ORDER — GABAPENTIN 300 MG PO CAPS
300.0000 mg | ORAL_CAPSULE | ORAL | Status: AC
  Administered 2016-11-05: 300 mg via ORAL
  Filled 2016-11-02 (×2): qty 1

## 2016-11-02 MED ORDER — CELECOXIB 200 MG PO CAPS
400.0000 mg | ORAL_CAPSULE | ORAL | Status: AC
  Administered 2016-11-05: 400 mg via ORAL
  Filled 2016-11-02 (×2): qty 2

## 2016-11-02 MED ORDER — DEXTROSE 5 % IV SOLN
3.0000 g | INTRAVENOUS | Status: AC
  Administered 2016-11-05: 3 g via INTRAVENOUS
  Filled 2016-11-02: qty 3000

## 2016-11-03 NOTE — H&P (Signed)
Kayla Price Location: [facility] Surgery Patient #: [id number] DOB: [birth year] Widowed / Language: [place] / Race: Black or African American Female       History of Present Illness       The patient is a 61 year old female who presents with breast cancer. This is a 61 year old American female. She is here for a preop visit to schedule surgery for her recurrent right breast cancer. Dr. Darnelle Catalan is her oncologist. Dr. Romero Belling is her endocrinologist. Dr. Zenaida Niece dam is her infectious disease physician. Her PCP is Abbey Chatters, NP at [facility].      In 2005 Dr. Francina Ames performed a right lumpectomy and sentinel node biopsy. She had a positive node and received radiation therapy and chemotherapy. The port on the left side has been removed. She took tamoxifen and has been followed by Dr. Darnelle Catalan.      She has a scar in the lumpectomy site that has been there for years. She recently felt something funny in the right breast more laterally. At the 9 o'clock position there is a 2.5 cm mass and an abnormal lymph node. 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      She has seen Dr. Darnelle Catalan and she will require adjuvant chemotherapy and a port is requested. She has seen Dr. Basilio Cairo and she cannot be radiated in the axilla again and she recommends full axillary lymph node dissection.     Comorbidities include that she is HIV positive but doing well with undetectable levels on 3 drugs. BMI 40. History of right breast cancer. Hypertension. CK D3. History Graves' disease treated with RAI and followed by Dr. Everardo All. Family history negative for breast ovarian or prostate cancer. Lives in [place]. Widow. 2 children. Denies alcohol or tobacco ever. Retired from the [employer].        She'll be scheduled for right modified radical mastectomy  and Port-A-Cath insertion. She is not interested in reconstruction I discussed the indications, details, techniques, and numerous risk of the surgery with her and her sister. She is aware of the risk of bleeding, infection, arm swelling, arm numbness, shoulder disability, pneumothorax, malfunction of Port-A-Cath requiring revision, and other unforeseen problems. She understands these issues well. All of her questions were answered. She agrees with this plan.   Medication History  Levothyroxine Sodium ( Tablet, Oral) Active. Edurant (25MG  Tablet, Oral) Active. Methocarbamol (500MG  Tablet, Oral) Active. Olmesartan Medoxomil-HCTZ (40-25MG  Tablet, Oral) Active. Tivicay (50MG  Tablet, Oral) Active. Prezcobix (800-150MG  Tablet, Oral) Active. Vitamin D3 (Oral) Specific strength unknown - Active. Pitavastatin Calcium (4MG  Tablet, Oral) Active. Medications Reconciled  Vitals  Weight: 269 lb Height: 67in Body Surface Area: 2.29 m Body Mass Index: 42.13 kg/m  Temp.: 98.92F  Pulse: 78 (Regular)  BP: 150/100 (Sitting, Left Arm, Standard)    Physical Exam  General Mental Status-Alert. General Appearance-Not in acute distress. Build & Nutrition-Well nourished. Posture-Normal posture. Gait-Normal. Note: BMI 42   Head and Neck Head-normocephalic, atraumatic with no lesions or palpable masses. Trachea-midline. Thyroid Gland Characteristics - normal size and consistency and no palpable nodules.  Chest and Lung Exam Chest and lung exam reveals -on auscultation, normal breath sounds, no adventitious sounds and normal vocal resonance.  Breast Note: Breasts are large. Well-healed curvilinear scar right breast upper outer quadrant and well-healed right axillary scar. Superiorly at the most medial aspect of  the lumpectomy scar there is some thickening which she says is chronic. More laterally at about the 9:30 position is a mobile 2.5 cm mass  consistent with the known cancer. No other masses in either breast. No palpable adenopathy. No supraclavicular adenopathy. Old Port-A-Cath site left infraclavicular area.   Cardiovascular Cardiovascular examination reveals -normal heart sounds, regular rate and rhythm with no murmurs and femoral artery auscultation bilaterally reveals normal pulses, no bruits, no thrills.  Abdomen Inspection Inspection of the abdomen reveals - No Hernias. Palpation/Percussion Palpation and Percussion of the abdomen reveal - Soft, Non Tender, No Rigidity (guarding), No hepatosplenomegaly and No Palpable abdominal masses.  Neurologic Neurologic evaluation reveals -alert and oriented x 3 with no impairment of recent or remote memory, normal attention span and ability to concentrate, normal sensation and normal coordination.  Musculoskeletal Normal Exam - Bilateral-Upper Extremity Strength Normal and Lower Extremity Strength Normal.    Assessment & Plan  RECURRENT BREAST CANCER, RIGHT (C50.911)  We have reviewed your entire situation. You have recurrent cancer in the right breast you also have a biopsy-proven lymph node in the right axilla You have a triple negative breast cancer, so the only systemic treatment that is available is chemotherapy You cannot have radiation therapy again  you will be scheduled for right modified radical mastectomy and Port-A-Cath insertion We have discussed the indications, techniques, and risk of this surgery in detail  You state that you are not interested in reconstruction The surgery will be scheduled as soon as possible  Pt Education - CCS Mastectomy HCI HISTORY OF RIGHT BREAST CANCER (Z85.3) HIV (HUMAN IMMUNODEFICIENCY VIRUS INFECTION) (B20) BMI 40.0-44.9, ADULT (Z68.41) HYPERTENSION, BENIGN (I10) HISTORY OF GRAVES' DISEASE (Z86.39) CKD (CHRONIC KIDNEY DISEASE), STAGE III (N18.3)    Kayla Price M. Kayla Price, M.D., [facility] General and Minimally invasive Surgery Breast and Colorectal Surgery Office:   [phone] Pager:   [phone]

## 2016-11-05 ENCOUNTER — Ambulatory Visit (HOSPITAL_COMMUNITY): Payer: Medicare HMO

## 2016-11-05 ENCOUNTER — Ambulatory Visit (HOSPITAL_COMMUNITY): Payer: Medicare HMO | Admitting: Certified Registered Nurse Anesthetist

## 2016-11-05 ENCOUNTER — Encounter (HOSPITAL_COMMUNITY): Payer: Self-pay | Admitting: Urology

## 2016-11-05 ENCOUNTER — Encounter (HOSPITAL_COMMUNITY)
Admission: RE | Disposition: A | Discharge status: Home / Self Care | Payer: Self-pay | Source: Ambulatory Visit | Attending: General Surgery

## 2016-11-05 ENCOUNTER — Ambulatory Visit (HOSPITAL_COMMUNITY)
Admission: RE | Admit: 2016-11-05 | Discharge: 2016-11-06 | Disposition: A | Discharge status: Home / Self Care | Payer: Medicare HMO | Source: Ambulatory Visit | Attending: General Surgery | Admitting: General Surgery

## 2016-11-05 DIAGNOSIS — N183 Chronic kidney disease, stage 3 (moderate): Secondary | ICD-10-CM | POA: Insufficient documentation

## 2016-11-05 DIAGNOSIS — G8918 Other acute postprocedural pain: Secondary | ICD-10-CM | POA: Diagnosis not present

## 2016-11-05 DIAGNOSIS — B20 Human immunodeficiency virus [HIV] disease: Secondary | ICD-10-CM | POA: Diagnosis present

## 2016-11-05 DIAGNOSIS — Z21 Asymptomatic human immunodeficiency virus [HIV] infection status: Secondary | ICD-10-CM | POA: Insufficient documentation

## 2016-11-05 DIAGNOSIS — Z79899 Other long term (current) drug therapy: Secondary | ICD-10-CM | POA: Diagnosis not present

## 2016-11-05 DIAGNOSIS — Z853 Personal history of malignant neoplasm of breast: Secondary | ICD-10-CM | POA: Insufficient documentation

## 2016-11-05 DIAGNOSIS — M1990 Unspecified osteoarthritis, unspecified site: Secondary | ICD-10-CM | POA: Insufficient documentation

## 2016-11-05 DIAGNOSIS — Z6841 Body Mass Index (BMI) 40.0 and over, adult: Secondary | ICD-10-CM | POA: Diagnosis not present

## 2016-11-05 DIAGNOSIS — I739 Peripheral vascular disease, unspecified: Secondary | ICD-10-CM | POA: Diagnosis not present

## 2016-11-05 DIAGNOSIS — C50911 Malignant neoplasm of unspecified site of right female breast: Secondary | ICD-10-CM | POA: Diagnosis not present

## 2016-11-05 DIAGNOSIS — Z171 Estrogen receptor negative status [ER-]: Secondary | ICD-10-CM

## 2016-11-05 DIAGNOSIS — C773 Secondary and unspecified malignant neoplasm of axilla and upper limb lymph nodes: Secondary | ICD-10-CM | POA: Insufficient documentation

## 2016-11-05 DIAGNOSIS — I10 Essential (primary) hypertension: Secondary | ICD-10-CM | POA: Diagnosis not present

## 2016-11-05 DIAGNOSIS — I129 Hypertensive chronic kidney disease with stage 1 through stage 4 chronic kidney disease, or unspecified chronic kidney disease: Secondary | ICD-10-CM | POA: Diagnosis not present

## 2016-11-05 DIAGNOSIS — Z95828 Presence of other vascular implants and grafts: Secondary | ICD-10-CM

## 2016-11-05 DIAGNOSIS — C50411 Malignant neoplasm of upper-outer quadrant of right female breast: Secondary | ICD-10-CM | POA: Diagnosis not present

## 2016-11-05 DIAGNOSIS — Z4682 Encounter for fitting and adjustment of non-vascular catheter: Secondary | ICD-10-CM | POA: Diagnosis not present

## 2016-11-05 DIAGNOSIS — Z419 Encounter for procedure for purposes other than remedying health state, unspecified: Secondary | ICD-10-CM

## 2016-11-05 DIAGNOSIS — E0500 Thyrotoxicosis with diffuse goiter without thyrotoxic crisis or storm: Secondary | ICD-10-CM | POA: Insufficient documentation

## 2016-11-05 DIAGNOSIS — C50919 Malignant neoplasm of unspecified site of unspecified female breast: Secondary | ICD-10-CM | POA: Diagnosis present

## 2016-11-05 HISTORY — PX: MASTECTOMY MODIFIED RADICAL: SHX5962

## 2016-11-05 HISTORY — PX: MODIFIED RADICAL MASTECTOMY: SHX5703

## 2016-11-05 HISTORY — PX: PORTACATH PLACEMENT: SHX2246

## 2016-11-05 LAB — COMPREHENSIVE METABOLIC PANEL
ALBUMIN: 3.3 g/dL — AB (ref 3.5–5.0)
ALK PHOS: 66 U/L (ref 38–126)
ALT: 13 U/L — ABNORMAL LOW (ref 14–54)
AST: 23 U/L (ref 15–41)
Anion gap: 9 (ref 5–15)
BILIRUBIN TOTAL: 0.6 mg/dL (ref 0.3–1.2)
BUN: 16 mg/dL (ref 6–20)
CALCIUM: 9.3 mg/dL (ref 8.9–10.3)
CO2: 28 mmol/L (ref 22–32)
Chloride: 101 mmol/L (ref 101–111)
Creatinine, Ser: 1.48 mg/dL — ABNORMAL HIGH (ref 0.44–1.00)
GFR calc Af Amer: 43 mL/min — ABNORMAL LOW (ref >60)
GFR calc non Af Amer: 37 mL/min — ABNORMAL LOW (ref >60)
GLUCOSE: 96 mg/dL (ref 65–99)
POTASSIUM: 3.3 mmol/L — AB (ref 3.5–5.1)
Sodium: 138 mmol/L (ref 135–145)
TOTAL PROTEIN: 6.8 g/dL (ref 6.5–8.1)

## 2016-11-05 LAB — CBC
HCT: 47.4 % — ABNORMAL HIGH (ref 36.0–46.0)
Hemoglobin: 16 g/dL — ABNORMAL HIGH (ref 12.0–15.0)
MCH: 32 pg (ref 26.0–34.0)
MCHC: 33.8 g/dL (ref 30.0–36.0)
MCV: 94.8 fL (ref 78.0–100.0)
PLATELETS: 174 10*3/uL (ref 150–400)
RBC: 5 MIL/uL (ref 3.87–5.11)
RDW: 14.2 % (ref 11.5–15.5)
WBC: 6.5 10*3/uL (ref 4.0–10.5)

## 2016-11-05 LAB — DIFFERENTIAL
Basophils Absolute: 0 10*3/uL (ref 0.0–0.1)
Basophils Relative: 1 %
Eosinophils Absolute: 0.3 10*3/uL (ref 0.0–0.7)
Eosinophils Relative: 6 %
LYMPHS ABS: 1.9 10*3/uL (ref 0.7–4.0)
Lymphocytes Relative: 46 %
MONO ABS: 0.4 10*3/uL (ref 0.1–1.0)
MONOS PCT: 9 %
Neutro Abs: 1.6 10*3/uL — ABNORMAL LOW (ref 1.7–7.7)
Neutrophils Relative %: 38 %

## 2016-11-05 LAB — CREATININE, SERUM
CREATININE: 1.48 mg/dL — AB (ref 0.44–1.00)
GFR calc Af Amer: 43 mL/min — ABNORMAL LOW (ref >60)
GFR calc non Af Amer: 37 mL/min — ABNORMAL LOW (ref >60)

## 2016-11-05 SURGERY — MASTECTOMY, MODIFIED RADICAL
Anesthesia: General | Laterality: Right

## 2016-11-05 MED ORDER — LACTATED RINGERS IV SOLN
INTRAVENOUS | Status: DC
  Administered 2016-11-05: 18:00:00 via INTRAVENOUS

## 2016-11-05 MED ORDER — DARUNAVIR-COBICISTAT 800-150 MG PO TABS
1.0000 | ORAL_TABLET | Freq: Every evening | ORAL | Status: DC
  Filled 2016-11-05: qty 1

## 2016-11-05 MED ORDER — ONDANSETRON HCL 4 MG/2ML IJ SOLN
INTRAMUSCULAR | Status: AC
  Filled 2016-11-05: qty 4

## 2016-11-05 MED ORDER — IOPAMIDOL (ISOVUE-300) INJECTION 61%
INTRAVENOUS | Status: AC
  Filled 2016-11-05: qty 50

## 2016-11-05 MED ORDER — HYDROCHLOROTHIAZIDE 25 MG PO TABS
25.0000 mg | ORAL_TABLET | Freq: Every day | ORAL | Status: DC
  Administered 2016-11-06: 25 mg via ORAL
  Filled 2016-11-05: qty 1

## 2016-11-05 MED ORDER — BUPIVACAINE-EPINEPHRINE (PF) 0.5% -1:200000 IJ SOLN
INTRAMUSCULAR | Status: DC | PRN
  Administered 2016-11-05: 25 mL

## 2016-11-05 MED ORDER — PHENYLEPHRINE HCL 10 MG/ML IJ SOLN
INTRAVENOUS | Status: DC | PRN
  Administered 2016-11-05: 10 ug/min via INTRAVENOUS

## 2016-11-05 MED ORDER — PRAVASTATIN SODIUM 10 MG PO TABS
20.0000 mg | ORAL_TABLET | Freq: Every day | ORAL | Status: DC
Start: 2016-11-05 — End: 2016-11-05

## 2016-11-05 MED ORDER — ENOXAPARIN SODIUM 40 MG/0.4ML ~~LOC~~ SOLN
40.0000 mg | SUBCUTANEOUS | Status: DC
  Administered 2016-11-06: 40 mg via SUBCUTANEOUS
  Filled 2016-11-05: qty 0.4

## 2016-11-05 MED ORDER — HYDROMORPHONE HCL 1 MG/ML IJ SOLN: 0.2500 mg | INTRAMUSCULAR | Status: DC | PRN

## 2016-11-05 MED ORDER — MIDAZOLAM HCL 2 MG/2ML IJ SOLN
INTRAMUSCULAR | Status: DC | PRN
  Administered 2016-11-05 (×2): 1 mg via INTRAVENOUS

## 2016-11-05 MED ORDER — ACETAMINOPHEN 650 MG RE SUPP: 650.0000 mg | Freq: Four times a day (QID) | RECTAL | Status: DC | PRN

## 2016-11-05 MED ORDER — METHOCARBAMOL 500 MG PO TABS: 500.0000 mg | ORAL_TABLET | Freq: Four times a day (QID) | ORAL | Status: DC | PRN

## 2016-11-05 MED ORDER — COENZYME Q10 30 MG PO CAPS: 30.0000 mg | ORAL_CAPSULE | Freq: Every day | ORAL | Status: DC

## 2016-11-05 MED ORDER — NONFORMULARY OR COMPOUNDED ITEM
4.0000 mg | Freq: Every day | Status: DC
  Administered 2016-11-05: 4 mg via ORAL

## 2016-11-05 MED ORDER — ONDANSETRON HCL 4 MG/2ML IJ SOLN
INTRAMUSCULAR | Status: DC | PRN
  Administered 2016-11-05 (×2): 4 mg via INTRAVENOUS

## 2016-11-05 MED ORDER — PROPOFOL 10 MG/ML IV BOLUS
INTRAVENOUS | Status: DC | PRN
  Administered 2016-11-05 (×2): 20 mg via INTRAVENOUS
  Administered 2016-11-05: 200 mg via INTRAVENOUS
  Administered 2016-11-05: 30 mg via INTRAVENOUS
  Administered 2016-11-05: 20 mg via INTRAVENOUS

## 2016-11-05 MED ORDER — RISAQUAD PO CAPS
1.0000 | ORAL_CAPSULE | Freq: Every day | ORAL | Status: DC
  Administered 2016-11-05 – 2016-11-06 (×2): 1 via ORAL
  Filled 2016-11-05 (×2): qty 1

## 2016-11-05 MED ORDER — EPHEDRINE 5 MG/ML INJ
INTRAVENOUS | Status: AC
  Filled 2016-11-05: qty 20

## 2016-11-05 MED ORDER — ACETAMINOPHEN 325 MG PO TABS: 650.0000 mg | ORAL_TABLET | Freq: Four times a day (QID) | ORAL | Status: DC | PRN

## 2016-11-05 MED ORDER — PHENYLEPHRINE HCL 10 MG/ML IJ SOLN
INTRAMUSCULAR | Status: DC | PRN
  Administered 2016-11-05: 80 ug via INTRAVENOUS
  Administered 2016-11-05 (×3): 40 ug via INTRAVENOUS
  Administered 2016-11-05 (×2): 80 ug via INTRAVENOUS
  Administered 2016-11-05: 40 ug via INTRAVENOUS

## 2016-11-05 MED ORDER — EPHEDRINE SULFATE 50 MG/ML IJ SOLN
INTRAMUSCULAR | Status: DC | PRN
  Administered 2016-11-05 (×2): 10 mg via INTRAVENOUS

## 2016-11-05 MED ORDER — FENTANYL CITRATE (PF) 100 MCG/2ML IJ SOLN
INTRAMUSCULAR | Status: DC | PRN
  Administered 2016-11-05: 50 ug via INTRAVENOUS
  Administered 2016-11-05 (×3): 25 ug via INTRAVENOUS
  Administered 2016-11-05: 50 ug via INTRAVENOUS
  Administered 2016-11-05 (×2): 25 ug via INTRAVENOUS
  Administered 2016-11-05 (×2): 50 ug via INTRAVENOUS

## 2016-11-05 MED ORDER — 0.9 % SODIUM CHLORIDE (POUR BTL) OPTIME
TOPICAL | Status: DC | PRN
  Administered 2016-11-05: 1000 mL

## 2016-11-05 MED ORDER — HYDROCODONE-ACETAMINOPHEN 5-325 MG PO TABS
1.0000 | ORAL_TABLET | Freq: Four times a day (QID) | ORAL | Status: DC | PRN
  Administered 2016-11-05 – 2016-11-06 (×2): 1 via ORAL
  Filled 2016-11-05 (×2): qty 1

## 2016-11-05 MED ORDER — PROMETHAZINE HCL 25 MG/ML IJ SOLN: 6.2500 mg | INTRAMUSCULAR | Status: DC | PRN

## 2016-11-05 MED ORDER — ONDANSETRON 4 MG PO TBDP: 4.0000 mg | ORAL_TABLET | Freq: Four times a day (QID) | ORAL | Status: DC | PRN

## 2016-11-05 MED ORDER — METHYLENE BLUE 0.5 % INJ SOLN
INTRAVENOUS | Status: AC
  Filled 2016-11-05: qty 10

## 2016-11-05 MED ORDER — VITAMIN D3 25 MCG (1000 UNIT) PO TABS
2000.0000 [IU] | ORAL_TABLET | Freq: Every day | ORAL | Status: DC
  Administered 2016-11-05 – 2016-11-06 (×2): 2000 [IU] via ORAL
  Filled 2016-11-05 (×4): qty 2

## 2016-11-05 MED ORDER — DEXAMETHASONE SODIUM PHOSPHATE 10 MG/ML IJ SOLN
INTRAMUSCULAR | Status: DC | PRN
  Administered 2016-11-05: 10 mg via INTRAVENOUS

## 2016-11-05 MED ORDER — ONDANSETRON HCL 4 MG/2ML IJ SOLN: 4.0000 mg | Freq: Four times a day (QID) | INTRAMUSCULAR | Status: DC | PRN

## 2016-11-05 MED ORDER — HEPARIN SOD (PORK) LOCK FLUSH 100 UNIT/ML IV SOLN
INTRAVENOUS | Status: AC
  Filled 2016-11-05: qty 5

## 2016-11-05 MED ORDER — BACITRACIN-NEOMYCIN-POLYMYXIN 400-5-5000 EX OINT
TOPICAL_OINTMENT | CUTANEOUS | Status: AC
  Filled 2016-11-05: qty 1

## 2016-11-05 MED ORDER — LIDOCAINE-EPINEPHRINE (PF) 1 %-1:200000 IJ SOLN
INTRAMUSCULAR | Status: DC | PRN
  Administered 2016-11-05: 7 mL

## 2016-11-05 MED ORDER — DEXAMETHASONE SODIUM PHOSPHATE 10 MG/ML IJ SOLN
INTRAMUSCULAR | Status: AC
  Filled 2016-11-05: qty 2

## 2016-11-05 MED ORDER — ROCURONIUM BROMIDE 10 MG/ML (PF) SYRINGE
PREFILLED_SYRINGE | INTRAVENOUS | Status: AC
  Filled 2016-11-05: qty 5

## 2016-11-05 MED ORDER — PITAVASTATIN CALCIUM 4 MG PO TABS: 4.0000 mg | ORAL_TABLET | Freq: Every day | ORAL | Status: DC

## 2016-11-05 MED ORDER — LEVOTHYROXINE SODIUM 75 MCG PO TABS
175.0000 ug | ORAL_TABLET | Freq: Every day | ORAL | Status: DC
  Administered 2016-11-06: 08:00:00 175 ug via ORAL
  Filled 2016-11-05: qty 1

## 2016-11-05 MED ORDER — PHENYLEPHRINE 40 MCG/ML (10ML) SYRINGE FOR IV PUSH (FOR BLOOD PRESSURE SUPPORT)
PREFILLED_SYRINGE | INTRAVENOUS | Status: AC
  Filled 2016-11-05: qty 20

## 2016-11-05 MED ORDER — CHLORHEXIDINE GLUCONATE CLOTH 2 % EX PADS: 6.0000 | MEDICATED_PAD | Freq: Once | CUTANEOUS | Status: DC

## 2016-11-05 MED ORDER — OLMESARTAN MEDOXOMIL-HCTZ 40-25 MG PO TABS: 1.0000 | ORAL_TABLET | Freq: Every day | ORAL | Status: DC

## 2016-11-05 MED ORDER — HYDROMORPHONE HCL 1 MG/ML IJ SOLN: 1.0000 mg | INTRAMUSCULAR | Status: DC | PRN

## 2016-11-05 MED ORDER — FENTANYL CITRATE (PF) 250 MCG/5ML IJ SOLN
INTRAMUSCULAR | Status: AC
  Filled 2016-11-05: qty 5

## 2016-11-05 MED ORDER — SODIUM CHLORIDE 0.9 % IV SOLN
INTRAVENOUS | Status: DC | PRN
  Administered 2016-11-05: 500 mL

## 2016-11-05 MED ORDER — METHOCARBAMOL 500 MG PO TABS: 500.0000 mg | ORAL_TABLET | Freq: Three times a day (TID) | ORAL | Status: DC | PRN

## 2016-11-05 MED ORDER — LIDOCAINE HCL (CARDIAC) 20 MG/ML IV SOLN
INTRAVENOUS | Status: DC | PRN
  Administered 2016-11-05: 100 mg via INTRAVENOUS

## 2016-11-05 MED ORDER — CEFAZOLIN SODIUM-DEXTROSE 2-4 GM/100ML-% IV SOLN
2.0000 g | Freq: Three times a day (TID) | INTRAVENOUS | Status: AC
  Administered 2016-11-05: 2 g via INTRAVENOUS
  Filled 2016-11-05: qty 100

## 2016-11-05 MED ORDER — HEPARIN SOD (PORK) LOCK FLUSH 100 UNIT/ML IV SOLN
INTRAVENOUS | Status: DC | PRN
  Administered 2016-11-05: 500 [IU] via INTRAVENOUS

## 2016-11-05 MED ORDER — RILPIVIRINE HCL 25 MG PO TABS
25.0000 mg | ORAL_TABLET | Freq: Every day | ORAL | Status: DC
  Administered 2016-11-05: 25 mg via ORAL
  Filled 2016-11-05 (×2): qty 1

## 2016-11-05 MED ORDER — DARUNAVIR-COBICISTAT 800-150 MG PO TABS
1.0000 | ORAL_TABLET | Freq: Every day | ORAL | Status: DC
  Administered 2016-11-05: 1 via ORAL
  Filled 2016-11-05 (×2): qty 1

## 2016-11-05 MED ORDER — LORATADINE 10 MG PO TABS: 10.0000 mg | ORAL_TABLET | Freq: Every day | ORAL | Status: DC | PRN

## 2016-11-05 MED ORDER — LIDOCAINE 2% (20 MG/ML) 5 ML SYRINGE
INTRAMUSCULAR | Status: AC
  Filled 2016-11-05: qty 10

## 2016-11-05 MED ORDER — SENNA 8.6 MG PO TABS
1.0000 | ORAL_TABLET | Freq: Two times a day (BID) | ORAL | Status: DC
  Administered 2016-11-05 – 2016-11-06 (×2): 8.6 mg via ORAL
  Filled 2016-11-05 (×2): qty 1

## 2016-11-05 MED ORDER — HYDROCODONE-ACETAMINOPHEN 5-325 MG PO TABS
1.0000 | ORAL_TABLET | ORAL | Status: DC | PRN
  Administered 2016-11-06: 1 via ORAL
  Filled 2016-11-05: qty 1

## 2016-11-05 MED ORDER — LIDOCAINE-EPINEPHRINE (PF) 1 %-1:200000 IJ SOLN
INTRAMUSCULAR | Status: AC
  Filled 2016-11-05: qty 30

## 2016-11-05 MED ORDER — MIDAZOLAM HCL 2 MG/2ML IJ SOLN
INTRAMUSCULAR | Status: AC
  Filled 2016-11-05: qty 2

## 2016-11-05 MED ORDER — LACTATED RINGERS IV SOLN
INTRAVENOUS | Status: DC | PRN
  Administered 2016-11-05 (×2): via INTRAVENOUS

## 2016-11-05 MED ORDER — PROPOFOL 10 MG/ML IV BOLUS
INTRAVENOUS | Status: AC
  Filled 2016-11-05: qty 40

## 2016-11-05 MED ORDER — OLMESARTAN MEDOXOMIL 40 MG PO TABS
40.0000 mg | ORAL_TABLET | Freq: Every day | ORAL | Status: DC
  Administered 2016-11-06: 40 mg via ORAL
  Filled 2016-11-05: qty 1

## 2016-11-05 MED ORDER — DOLUTEGRAVIR SODIUM 50 MG PO TABS
50.0000 mg | ORAL_TABLET | Freq: Every evening | ORAL | Status: DC
  Administered 2016-11-05: 50 mg via ORAL
  Filled 2016-11-05: qty 1

## 2016-11-05 SURGICAL SUPPLY — 88 items
ADH SKN CLS APL DERMABOND .7 (GAUZE/BANDAGES/DRESSINGS) ×2
ADH SKN CLS LQ APL DERMABOND (GAUZE/BANDAGES/DRESSINGS) ×2
APPLIER CLIP 9.375 MED OPEN (MISCELLANEOUS) ×4
BAG DECANTER FOR FLEXI CONT (MISCELLANEOUS) ×2 IMPLANT
BENZOIN TINCTURE PRP APPL 2/3 (GAUZE/BANDAGES/DRESSINGS) ×2 IMPLANT
BINDER BREAST LRG (GAUZE/BANDAGES/DRESSINGS) IMPLANT
BINDER BREAST XLRG (GAUZE/BANDAGES/DRESSINGS) ×2 IMPLANT
BLADE 15 SAFETY STRL DISP (BLADE) ×2 IMPLANT
BLADE CLIPPER SURG (BLADE) IMPLANT
BLADE SURG 11 STRL SS (BLADE) ×2 IMPLANT
BLADE SURG 15 STRL LF DISP TIS (BLADE) ×2 IMPLANT
BLADE SURG 15 STRL SS (BLADE) ×2
CANISTER SUCT 3000ML PPV (MISCELLANEOUS) ×2 IMPLANT
CHLORAPREP W/TINT 26ML (MISCELLANEOUS) ×2 IMPLANT
CLIP APPLIE 9.375 MED OPEN (MISCELLANEOUS) ×2 IMPLANT
CONT SPEC 4OZ CLIKSEAL STRL BL (MISCELLANEOUS) IMPLANT
COVER SURGICAL LIGHT HANDLE (MISCELLANEOUS) ×2 IMPLANT
COVER TRANSDUCER ULTRASND GEL (DRAPE) IMPLANT
CRADLE DONUT ADULT HEAD (MISCELLANEOUS) ×2 IMPLANT
DECANTER SPIKE VIAL GLASS SM (MISCELLANEOUS) ×2 IMPLANT
DERMABOND ADHESIVE PROPEN (GAUZE/BANDAGES/DRESSINGS) ×2
DERMABOND ADVANCED (GAUZE/BANDAGES/DRESSINGS) ×2
DERMABOND ADVANCED .7 DNX12 (GAUZE/BANDAGES/DRESSINGS) ×2 IMPLANT
DERMABOND ADVANCED .7 DNX6 (GAUZE/BANDAGES/DRESSINGS) ×2 IMPLANT
DEVICE DISSECT PLASMABLAD 3.0S (MISCELLANEOUS) IMPLANT
DRAIN CHANNEL 19F RND (DRAIN) ×2 IMPLANT
DRAPE C-ARM 42X72 X-RAY (DRAPES) ×2 IMPLANT
DRAPE CHEST BREAST 15X10 FENES (DRAPES) ×2 IMPLANT
DRAPE HALF SHEET 40X57 (DRAPES) ×2 IMPLANT
DRAPE LAPAROSCOPIC ABDOMINAL (DRAPES) ×2 IMPLANT
DRAPE UTILITY XL STRL (DRAPES) ×6 IMPLANT
DRSG ADAPTIC 3X8 NADH LF (GAUZE/BANDAGES/DRESSINGS) ×4 IMPLANT
DRSG PAD ABDOMINAL 8X10 ST (GAUZE/BANDAGES/DRESSINGS) ×4 IMPLANT
ELECT BLADE 4.0 EZ CLEAN MEGAD (MISCELLANEOUS) ×2
ELECT CAUTERY BLADE 6.4 (BLADE) ×2 IMPLANT
ELECT REM PT RETURN 9FT ADLT (ELECTROSURGICAL) ×2
ELECTRODE BLDE 4.0 EZ CLN MEGD (MISCELLANEOUS) ×1 IMPLANT
ELECTRODE REM PT RTRN 9FT ADLT (ELECTROSURGICAL) ×1 IMPLANT
EVACUATOR SILICONE 100CC (DRAIN) ×2 IMPLANT
GAUZE SPONGE 4X4 12PLY STRL (GAUZE/BANDAGES/DRESSINGS) ×4 IMPLANT
GAUZE SPONGE 4X4 16PLY XRAY LF (GAUZE/BANDAGES/DRESSINGS) ×2 IMPLANT
GLOVE BIOGEL PI IND STRL 6.5 (GLOVE) ×1 IMPLANT
GLOVE BIOGEL PI IND STRL 7.0 (GLOVE) ×2 IMPLANT
GLOVE BIOGEL PI INDICATOR 6.5 (GLOVE) ×1
GLOVE BIOGEL PI INDICATOR 7.0 (GLOVE) ×2
GLOVE ECLIPSE 6.5 STRL STRAW (GLOVE) ×4 IMPLANT
GLOVE EUDERMIC 7 POWDERFREE (GLOVE) ×2 IMPLANT
GLOVE SURG SS PI 6.5 STRL IVOR (GLOVE) ×2 IMPLANT
GOWN STRL REUS W/ TWL LRG LVL3 (GOWN DISPOSABLE) ×2 IMPLANT
GOWN STRL REUS W/ TWL XL LVL3 (GOWN DISPOSABLE) ×10 IMPLANT
GOWN STRL REUS W/TWL LRG LVL3 (GOWN DISPOSABLE) ×4
GOWN STRL REUS W/TWL XL LVL3 (GOWN DISPOSABLE) ×10
ILLUMINATOR WAVEGUIDE N/F (MISCELLANEOUS) IMPLANT
INTRODUCER 13FR (MISCELLANEOUS) IMPLANT
INTRODUCER COOK 11FR (CATHETERS) IMPLANT
KIT BASIN OR (CUSTOM PROCEDURE TRAY) ×2 IMPLANT
KIT PORT POWER 8FR ISP CVUE (Catheter) ×2 IMPLANT
KIT ROOM TURNOVER OR (KITS) ×2 IMPLANT
LIGHT WAVEGUIDE WIDE FLAT (MISCELLANEOUS) IMPLANT
NEEDLE HYPO 25GX1X1/2 BEV (NEEDLE) ×4 IMPLANT
NS IRRIG 1000ML POUR BTL (IV SOLUTION) ×2 IMPLANT
PACK GENERAL/GYN (CUSTOM PROCEDURE TRAY) ×2 IMPLANT
PACK SURGICAL SETUP 50X90 (CUSTOM PROCEDURE TRAY) ×2 IMPLANT
PAD ARMBOARD 7.5X6 YLW CONV (MISCELLANEOUS) ×2 IMPLANT
PENCIL BUTTON HOLSTER BLD 10FT (ELECTRODE) ×2 IMPLANT
PLASMABLADE 3.0S (MISCELLANEOUS)
SET INTRODUCER 12FR PACEMAKER (SHEATH) IMPLANT
SET SHEATH INTRODUCER 10FR (MISCELLANEOUS) IMPLANT
SHEATH COOK PEEL AWAY SET 9F (SHEATH) IMPLANT
SPECIMEN JAR X LARGE (MISCELLANEOUS) ×2 IMPLANT
SPONGE LAP 18X18 X RAY DECT (DISPOSABLE) ×2 IMPLANT
STAPLER VISISTAT 35W (STAPLE) ×2 IMPLANT
STRIP CLOSURE SKIN 1/2X4 (GAUZE/BANDAGES/DRESSINGS) ×2 IMPLANT
SURGILUBE 3G PEEL PACK STRL (MISCELLANEOUS) IMPLANT
SUT ETHILON 3 0 FSL (SUTURE) ×6 IMPLANT
SUT MNCRL AB 4-0 PS2 18 (SUTURE) ×4 IMPLANT
SUT PROLENE 2 0 CT2 30 (SUTURE) ×4 IMPLANT
SUT SILK 2 0 FS (SUTURE) ×2 IMPLANT
SUT VIC AB 2-0 BRD 54 (SUTURE) ×4 IMPLANT
SUT VIC AB 3-0 SH 18 (SUTURE) ×6 IMPLANT
SYR 10ML LL (SYRINGE) ×4 IMPLANT
SYR 5ML LUER SLIP (SYRINGE) ×2 IMPLANT
SYR CONTROL 10ML LL (SYRINGE) ×2 IMPLANT
TOWEL GREEN STERILE (TOWEL DISPOSABLE) ×2 IMPLANT
TOWEL OR 17X24 6PK STRL BLUE (TOWEL DISPOSABLE) ×2 IMPLANT
TOWEL OR 17X26 10 PK STRL BLUE (TOWEL DISPOSABLE) ×2 IMPLANT
TUBE CONNECTING 12X1/4 (SUCTIONS) IMPLANT
YANKAUER SUCT BULB TIP NO VENT (SUCTIONS) IMPLANT

## 2016-11-05 NOTE — Progress Notes (Signed)
Pt reports having "contact dermatitis" recently on her thighs and arms. States she no longer has any open areas. No open areas noted on assessment. Small red areas to skin on thighs. Dr. Dalbert Batman notified.

## 2016-11-05 NOTE — Transfer of Care (Signed)
Immediate Anesthesia Transfer of Care Note  Patient: Kayla Price  Procedure(s) Performed: Procedure(s): RIGHT MASTECTOMY MODIFIED RADICAL (Right) INSERTION PORT-A-CATH WITH ULTRA SOUND (Right)  Patient Location: PACU  Anesthesia Type:General  Level of Consciousness: oriented and drowsy  Airway & Oxygen Therapy: Patient Spontanous Breathing and Patient connected to nasal cannula oxygen  Post-op Assessment: Report given to RN and Post -op Vital signs reviewed and stable  Post vital signs: Reviewed and stable  Last Vitals:  Vitals:   11/05/16 0603  BP: (!) 159/98  Pulse: 68  Resp: 20  Temp: 36.6 C    Last Pain:  Vitals:   11/05/16 0603  TempSrc: Oral      Patients Stated Pain Goal: 5 (04/79/98 7215)  Complications: No apparent anesthesia complications

## 2016-11-05 NOTE — Anesthesia Postprocedure Evaluation (Addendum)
Anesthesia Post Note  Patient: Kayla Price  Procedure(s) Performed: Procedure(s) (LRB): RIGHT MASTECTOMY MODIFIED RADICAL (Right) INSERTION PORT-A-CATH WITH ULTRA SOUND (Right)  Patient location during evaluation: PACU Anesthesia Type: General and Regional Level of consciousness: awake and alert Pain management: pain level controlled Vital Signs Assessment: post-procedure vital signs reviewed and stable Respiratory status: spontaneous breathing, nonlabored ventilation, respiratory function stable and patient connected to nasal cannula oxygen Cardiovascular status: blood pressure returned to baseline and stable Postop Assessment: no signs of nausea or vomiting Anesthetic complications: no       Last Vitals:  Vitals:   11/05/16 1100 11/05/16 1110  BP: (!) 152/88   Pulse: 73 73  Resp: 15 19  Temp:  36.2 C    Last Pain:  Vitals:   11/05/16 1100  TempSrc:   PainSc: 0-No pain                 Rashida Ladouceur,JAMES TERRILL

## 2016-11-05 NOTE — Anesthesia Procedure Notes (Signed)
Procedure Name: LMA Insertion Date/Time: 11/05/2016 7:52 AM Performed by: Merdis Delay Pre-anesthesia Checklist: Patient identified, Emergency Drugs available, Patient being monitored, Suction available and Timeout performed Patient Re-evaluated:Patient Re-evaluated prior to inductionOxygen Delivery Method: Circle system utilized Preoxygenation: Pre-oxygenation with 100% oxygen Intubation Type: IV induction LMA: LMA inserted LMA Size: 4.0 Number of attempts: 1 Placement Confirmation: positive ETCO2 and breath sounds checked- equal and bilateral Tube secured with: Tape Dental Injury: Teeth and Oropharynx as per pre-operative assessment

## 2016-11-05 NOTE — Anesthesia Procedure Notes (Addendum)
Anesthesia Regional Block: Pectoralis block   Pre-Anesthetic Checklist: ,, timeout performed, Correct Patient, Correct Site, Correct Laterality, Correct Procedure, Correct Position, site marked, Risks and benefits discussed,  Surgical consent,  Pre-op evaluation,  At surgeon's request and post-op pain management  Laterality: Right and Upper  Prep: chloraprep       Needles:   Needle Type: Echogenic Stimulator Needle     Needle Length: 9cm  Needle Gauge: 21   Needle insertion depth: 7 cm   Additional Needles:   Procedures: ultrasound guided,,,,,,,,  Narrative:  Start time: 11/05/2016 7:05 AM End time: 11/05/2016 7:22 AM Injection made incrementally with aspirations every 5 mL.  Performed by: Personally  Anesthesiologist: Lockie Bothun

## 2016-11-05 NOTE — Op Note (Signed)
Patient Name:           Kayla Price   Date of Surgery:        11/05/2016  Pre op Diagnosis:      Recurrent cancer, right breast, with ipsilateral axillary lymph node metastasis  Post op Diagnosis:    Same  Procedure:                 Insertion of PowerPort, clearVue, 8 French tunneled venous vascular access device                                      Use of fluoroscopy for guidance and positioning                                      Right modified radical mastectomy  Surgeon:                     Edsel Petrin. Dalbert Batman, M.D., FACS  Assistant:                      RNFA   Indication for Assistant: exposure  Operative Indications:    This is a 61 year old American female. She is brought to the operating room for surgical management of her recurrent right breast cancer. Dr. Jana Hakim is her oncologist. Dr. Renato Shin is her endocrinologist. Dr. Lucianne Lei dam is her infectious disease physician. Her PCP is [name], NP at [facility].      In 2005 Dr. Marylene Buerger performed a right lumpectomy and sentinel node biopsy. She had a positive node and received radiation therapy and chemotherapy. The port on the left side has been removed. She took tamoxifen and has been followed by Dr. Jana Hakim.      She has a scar in the lumpectomy site that has been there for years. She recently felt something funny in the right breast more laterally. At the 9 o'clock position there is a 2.5 cm mass and an abnormal lymph node. 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      She has seen Dr. Jana Hakim and she will require adjuvant chemotherapy and a port is requested. She has seen Dr. Isidore Moos and she cannot be radiated in the axilla again and she recommends full axillary lymph node dissection.     Comorbidities include that she is HIV positive but doing well with undetectable levels on 3  drugs. BMI 40. History of right breast cancer. Hypertension. CKD III. History Graves' disease treated with RAI and followed by Dr. Loanne Drilling. Family history negative for breast ovarian or prostate cancer. Lives in [place]. Widow. 2 children. Denies alcohol or tobacco ever. Retired from the [employer].        She'll be scheduled for right modified radical mastectomy and Port-A-Cath insertion. She is not interested in reconstruction I discussed the indications, details, techniques, and numerous risk of the surgery with her and her sister.. She agrees with this plan.  Operative Findings:       I was unable to get blood return on the left subclavian vein and so I placed the port through the right subclavian vein without difficulty.  The port was flushing  easily and had excellent blood return at the completion of the procedure and appeared well-positioned.  The cancer in the right breast was palpable but was not invading scan or the chest wall.  There was some palpably abnormal lymph nodes in the right axilla.  A complete level I and level II lymph node dissection was performed in the right axilla.  Procedure in Detail:          Following the induction of general LMA anesthesia a surgical timeout was performed.  Intravenous antibiotics were given.  The patient was positioned with her arms tucked at her sides and a roll behind her shoulders.  The neck and chest were prepped and draped in a sterile fashion.  Attempt at left subclavian venipuncture was unsuccessful.  Right subclavian venipuncture was successful with the first pass and a guidewire inserted into the superior vena cava under fluoroscopic guidance.  Using the C-arm I drew a template on the chest wall to position the catheter tip so that it would be in the superior vena cava right at the right atrial junction.  Small incision was made at the wire insertion site.  Due to the patient's large body habitus I placed an infraclavicular  incision medially and debrided some of the subcutaneous tissue.  Using a tunneling device I passed the catheter from the wire insertion site to the port pocket site.  Using the template marking the chest wall I chose to cut the catheter 23 cm in length.  The catheter was secured to the port with the locking device and the port and catheter flushed with heparinized saline.  The port was sutured to the pectoralis fascia with 3 interrupted sutures of 2-0 Prolene.  The dilator and peel-away sheath were inserted into the central venous circulation over the guidewire, wire and dilator were removed, catheter was threaded and the peel-away sheath removed.  Catheter flushed easily and had excellent blood return.  Fluoroscopy confirmed that the catheter tip was in the vena cava near the right atrium and there was no deformity of the catheter anywhere along its course.  The port and catheter were then flushed with concentrated heparin.  The subcutaneous tissue was closed with 3-0 Vicryl sutures and skin closed with subcuticular 4-0 Monocryl and Dermabond.     The patient was then repositioned with her arms out at her sides.  The neck and chest wall and axilla prepped and draped in a sterile fashion.  Another surgical timeout was performed.  Using a marking pin I fashioned a transverse elliptical incision.  This was designed to stay above the palpable cancer in the upper outer quadrant and remove the old lumpectomy scar and to minimize skin redundancy and dogears.  The elliptical incision was made.  Skin flaps were raised superiorly to just below the Port-A-Cath, medially to the parasternal area, inferiorly to the inferior rectus sheath and laterally to the anterior border of latissimus dorsi muscle.  The lateral skin margin was marked with a silk suture.  The breast was then dissected off of the pectoralis major and minor muscles.  The clavipectoral fascia was incised and I entered the right axilla.  I took the dissection all  the way up to the axillary vein.  The axillary vein was identified and preserved.  Venous tributaries were controlled with metal clips.  There were some small palpably abnormal lymph nodes and  all of this was removed.  Level I and level II dissection was performed.  At the completion of the  dissection the long thoracic nerve and the thoracodorsal neurovascular bundle were visualized and preserved and functioning.     The wound was irrigated.  Hemostasis was excellent and had been achieved with electrocautery, metal clips, and some Vicryl ties.  Two 37 French Blake drains were placed, one up into the axilla and one across the skin flaps.  These were brought out through separate stab incisions inferolaterally and sutured to the skin and connected to suction bulbs.  Mastectomy incision was closed in 2 layers.  Inner layer of  3-0 Vicryl for the subcutaneous tissue.  The skin was closed with a running subcuticular 4-0 Monocryl and Dermabond.  Dry bandages and a breast binder were placed.  The patient tolerated the procedure well was taken to PACU in stable condition.  EBL 125 mL or less.  Counts correct.  Complications none.     Edsel Petrin. Dalbert Batman, M.D., FACS General and Minimally Invasive Surgery Breast and Colorectal Surgery  11/05/2016 10:11 AM

## 2016-11-05 NOTE — Progress Notes (Signed)
PHARMACIST - PHYSICIAN ORDER COMMUNICATION  CONCERNING: P&T Medication Policy on Herbal Medications  DESCRIPTION:  This patient's order for:  CoEnzyme- Q  has been noted.  This product(s) is classified as an "herbal" or natural product. Due to a lack of definitive safety studies or FDA approval, nonstandard manufacturing practices, plus the potential risk of unknown drug-drug interactions while on inpatient medications, the Pharmacy and Therapeutics Committee does not permit the use of "herbal" or natural products of this type within [facility].   ACTION TAKEN: The pharmacy department is unable to verify this order at this time and your patient has been informed of this safety policy. Please reevaluate patient's clinical condition at discharge and address if the herbal or natural product(s) should be resumed at that time.   Lewie Chamber., PharmD Clinical Pharmacist [facility]

## 2016-11-05 NOTE — Anesthesia Preprocedure Evaluation (Addendum)
Anesthesia Evaluation  Patient identified by MRN, date of birth, ID band Patient awake    Reviewed: Allergy & Precautions, NPO status , Patient's Chart, lab work & pertinent test results  History of Anesthesia Complications Negative for: history of anesthetic complications  Airway Mallampati: I   Neck ROM: Full    Dental  (+) Teeth Intact   Pulmonary    breath sounds clear to auscultation       Cardiovascular hypertension, + Peripheral Vascular Disease   Rhythm:Regular Rate:Normal     Neuro/Psych    GI/Hepatic   Endo/Other  Hypothyroidism Morbid obesity  Renal/GU Renal disease     Musculoskeletal  (+) Arthritis ,   Abdominal (+) + obese,   Peds  Hematology  (+) Blood dyscrasia, , HIV,   Anesthesia Other Findings   Reproductive/Obstetrics                            Anesthesia Physical Anesthesia Plan  ASA: III  Anesthesia Plan: General   Post-op Pain Management:  Regional for Post-op pain   Induction: Intravenous  Airway Management Planned: Oral ETT  Additional Equipment:   Intra-op Plan:   Post-operative Plan: Extubation in OR  Informed Consent: I have reviewed the patients History and Physical, chart, labs and discussed the procedure including the risks, benefits and alternatives for the proposed anesthesia with the patient or authorized representative who has indicated his/her understanding and acceptance.   Dental advisory given  Plan Discussed with:   Anesthesia Plan Comments:         Anesthesia Quick Evaluation

## 2016-11-06 ENCOUNTER — Encounter (HOSPITAL_COMMUNITY): Payer: Self-pay | Admitting: General Surgery

## 2016-11-06 DIAGNOSIS — Z21 Asymptomatic human immunodeficiency virus [HIV] infection status: Secondary | ICD-10-CM | POA: Diagnosis not present

## 2016-11-06 DIAGNOSIS — Z853 Personal history of malignant neoplasm of breast: Secondary | ICD-10-CM | POA: Diagnosis not present

## 2016-11-06 DIAGNOSIS — I129 Hypertensive chronic kidney disease with stage 1 through stage 4 chronic kidney disease, or unspecified chronic kidney disease: Secondary | ICD-10-CM | POA: Diagnosis not present

## 2016-11-06 DIAGNOSIS — I739 Peripheral vascular disease, unspecified: Secondary | ICD-10-CM | POA: Diagnosis not present

## 2016-11-06 DIAGNOSIS — Z79899 Other long term (current) drug therapy: Secondary | ICD-10-CM | POA: Diagnosis not present

## 2016-11-06 DIAGNOSIS — N183 Chronic kidney disease, stage 3 (moderate): Secondary | ICD-10-CM | POA: Diagnosis not present

## 2016-11-06 DIAGNOSIS — C773 Secondary and unspecified malignant neoplasm of axilla and upper limb lymph nodes: Secondary | ICD-10-CM | POA: Diagnosis not present

## 2016-11-06 DIAGNOSIS — Z171 Estrogen receptor negative status [ER-]: Secondary | ICD-10-CM | POA: Diagnosis not present

## 2016-11-06 DIAGNOSIS — C50911 Malignant neoplasm of unspecified site of right female breast: Secondary | ICD-10-CM | POA: Diagnosis not present

## 2016-11-06 LAB — CBC
HCT: 42.6 % (ref 36.0–46.0)
Hemoglobin: 14.4 g/dL (ref 12.0–15.0)
MCH: 31.6 pg (ref 26.0–34.0)
MCHC: 33.8 g/dL (ref 30.0–36.0)
MCV: 93.4 fL (ref 78.0–100.0)
PLATELETS: 186 10*3/uL (ref 150–400)
RBC: 4.56 MIL/uL (ref 3.87–5.11)
RDW: 13.8 % (ref 11.5–15.5)
WBC: 11.3 10*3/uL — ABNORMAL HIGH (ref 4.0–10.5)

## 2016-11-06 LAB — BASIC METABOLIC PANEL
Anion gap: 8 (ref 5–15)
BUN: 15 mg/dL (ref 6–20)
CALCIUM: 9.8 mg/dL (ref 8.9–10.3)
CHLORIDE: 102 mmol/L (ref 101–111)
CO2: 26 mmol/L (ref 22–32)
CREATININE: 1.31 mg/dL — AB (ref 0.44–1.00)
GFR calc Af Amer: 50 mL/min — ABNORMAL LOW (ref >60)
GFR calc non Af Amer: 43 mL/min — ABNORMAL LOW (ref >60)
GLUCOSE: 151 mg/dL — AB (ref 65–99)
Potassium: 3.7 mmol/L (ref 3.5–5.1)
Sodium: 136 mmol/L (ref 135–145)

## 2016-11-06 MED ORDER — HYDROCODONE-ACETAMINOPHEN 5-325 MG PO TABS: 1.0000 | ORAL_TABLET | ORAL | 0 refills | Status: DC | PRN

## 2016-11-06 NOTE — Progress Notes (Signed)
Surgery:  Doing well and wants to go home Tolerating diet. Ambulating in halls. Minimal pain Moves shoulders around reasonably well  On exam she is alert and comfortable with normal mental status Right mastectomy skin flaps are healthy.  No hematoma.  Drainage serosanguineous.  Plan: Discharge home today [place] for pain. Prescription for camisole given Shoulder range of motion exercises discussed Drain care discussed Se me in 9-10 days(discharge summary to follow.)  I have logged onto the [facility] website and reviewed her prescription medication history   Diahn Waidelich M. Dalbert Batman, M.D., [facility] General and Minimally invasive Surgery Breast and Colorectal Surgery Office:   [phone] Pager:   [phone]

## 2016-11-06 NOTE — Discharge Summary (Signed)
Patient ID: Kayla Price [id number] y.o. Jan 03, 1956  Admit date: 11/05/2016  Discharge date and time: 11/06/2016  Admitting Physician: [name]  Discharge Physician: [name]  Admission Diagnoses: recurrent cancer right breast  Discharge Diagnoses: Recurrent right breast cancer                                         HIV infection                                         BMI 40                                         Hypertension, benign                                         History of Graves' disease                                         CKD, stage III  Operations: Procedure(s): RIGHT MASTECTOMY MODIFIED RADICAL INSERTION PORT-A-CATH WITH ULTRA SOUND  Admission Condition: good  Discharged Condition: good  Indication for Admission: This is a 61 year old American female. She is brought to the operating room for surgical management of her recurrent right breast cancer. Dr. Jana Hakim is her oncologist. Dr. Renato Shin is her endocrinologist. Dr. Lucianne Lei dam is her infectious disease physician. Her PCP is [name], NP at [facility]. In 2005 Dr. Marylene Buerger performed a right lumpectomy and sentinel node biopsy. She had a positive node and received radiation therapy and chemotherapy. The port on the left side has been removed. She took tamoxifen and has been followed by Dr. Jana Hakim. She has a scar in the lumpectomy site that has been there for years. She recently felt something in the right breast more laterally. At the 9 o'clock position there is a 2.5 cm mass and an abnormal lymph node. 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 She has seen Dr. Jana Hakim and she will require adjuvant chemotherapy and a port is requested. She has seen Dr. Isidore Moos and she cannot be radiated in the axilla again and she  recommends full axillary lymph node dissection. Comorbidities include that she is HIV positive but doing well with undetectable levels on 3 drugs. BMI 40. History of right breast cancer. Hypertension. CKD III. History Graves' disease treated with RAI and followed by Dr. Loanne Drilling. Family history negative for breast ovarian or prostate cancer. Lives in [place]. Widow. 2 children. Denies alcohol or tobacco ever. Retired from the [employer]. She'll be scheduled for right modified radical mastectomy and Port-A-Cath insertion. She is not interested in reconstruction I discussed the indications, details, techniques, and numerous risk of the surgery with her and her sister.. She agrees with this plan.  Hospital Course: On the day of admission the patient was taken to the operating room and underwent  a right modified radical mastectomy and insertion of Port-A-Cath.  The port is placed through the right subclavian vein.  The left subclavian vein was unsuitable.  Postop chest x-ray shows good positioning of the catheter and appears to flush well.  A complete level I and level II lymph node dissection was performed, and some of the lymph nodes were palpably abnormal although not grossly enlarged.  The cancer in the breast was not invading the skin or the chest wall.     Patient was observed overnight and did very well.  She tolerated diet.  Was ambulating in the halls.  Excellent pain control.  She felt ready to go home on postop day 1.  On the day of discharge her right mastectomy wound looked very good.  Skin flaps healthy.  No skin necrosis.  No hematoma or seroma.  Both drains functioning with serosanguineous output.  Right shoulder range of motion was pretty good.  Minimal sensory changes in right axilla.      I discussed diet and activities.  I discussed shoulder range of motion exercises.  I discussed wound care and drain care.  She was given a prescription for Norco.  She was told  to continue all of her other usual medications.  I told her that I would try to call her pathology report to her before the end of the week.    She will call and make an appointment to see me in 9-10 days.   Consults: None  Significant Diagnostic Studies: pathology, pending  Treatments: surgery: Right modified radical mastectomy, insertion of Port-A-Cath  Disposition: Home  Patient Instructions:  Allergies as of 11/06/2016      Reactions   Lisinopril Anaphylaxis, Swelling   Swelling of tongue and mouth 11/05/16- tolerates Olmesartan      Medication List    TAKE these medications   co-enzyme Q-10 30 MG capsule Take 1 tablet by mouth daily.   darunavir-cobicistat 800-150 MG tablet Commonly known as:  PREZCOBIX TAKE 1 TABLET BY MOUTH DAILY. SWALLOW WHOLE. DO NOT CRUSH, BREAK OR CHEW TABLETS. TAKE WITH FOOD. What changed:  how much to take  how to take this  when to take this  additional instructions   dolutegravir 50 MG tablet Commonly known as:  TIVICAY Take 1 tablet (50 mg total) by mouth daily. What changed:  when to take this   FISH OIL PO Take 1 capsule by mouth daily with lunch.   HYDROcodone-acetaminophen 5-325 MG tablet Commonly known as:  NORCO/VICODIN Take 1 tablet by mouth every 6 (six) hours as needed for moderate pain. What changed:  Another medication with the same name was added. Make sure you understand how and when to take each.   HYDROcodone-acetaminophen 5-325 MG tablet Commonly known as:  NORCO/VICODIN Take 1-2 tablets by mouth every 4 (four) hours as needed for moderate pain. What changed:  You were already taking a medication with the same name, and this prescription was added. Make sure you understand how and when to take each.   levothyroxine 175 MCG tablet Commonly known as:  SYNTHROID, LEVOTHROID Take 1 tablet (175 mcg total) by mouth daily before breakfast.   loratadine 10 MG tablet Commonly known as:  CLARITIN Take 10 mg by mouth  daily as needed for itching. Reported on 06/08/2015   methocarbamol 500 MG tablet Commonly known as:  ROBAXIN Take 500 mg by mouth every 8 (eight) hours as needed for muscle spasms.   olmesartan-hydrochlorothiazide 40-25 MG tablet Commonly known as:  BENICAR HCT TAKE 1 TABLET EVERY DAY What changed:  See the new instructions.   Pitavastatin Calcium 4 MG Tabs Take 1 tablet (4 mg total) by mouth daily. This may be placebo, study provided. Do not dispense. What changed:  when to take this  additional instructions   PROBIOTIC PO Take 1 capsule by mouth daily with lunch.   rilpivirine 25 MG Tabs tablet Commonly known as:  EDURANT TAKE 1 TABLET BY MOUTH EVERY DAY WITH BREAKFAST What changed:  how much to take  how to take this  when to take this  additional instructions   Vitamin D3 2000 units Tabs Take 2,000 Units by mouth daily with lunch.       Activity: Ambulate frequently.  Shoulder range of motion exercises.  No driving.  No heavy lifting. Diet: low fat, low cholesterol diet Wound Care: as directed  Follow-up:  With Dr. Dalbert Batman  in 9 days    Addendum.  I logged into the [employer] and reviewed her prescription medication history .  Signed: Edsel Petrin. Dalbert Batman, M.D., FACS General and minimally invasive surgery Breast and Colorectal Surgery  11/06/2016, 8:00 AM

## 2016-11-06 NOTE — Discharge Instructions (Signed)
[facility] °[phone] ° °MASTECTOMY: POST OP INSTRUCTIONS ° °Always review your discharge instruction sheet given to you by the facility where your surgery was performed. °IF YOU HAVE DISABILITY OR FAMILY LEAVE FORMS, YOU MUST BRING THEM TO THE OFFICE FOR PROCESSING.   °DO NOT GIVE THEM TO YOUR DOCTOR. °A prescription for pain medication may be given to you upon discharge.  Take your pain medication as prescribed, if needed.  If narcotic pain medicine is not needed, then you may take acetaminophen (Tylenol) or ibuprofen (Advil) as needed. °1. Take your usually prescribed medications unless otherwise directed. °2. If you need a refill on your pain medication, please contact your pharmacy.  They will contact our office to request authorization.  Prescriptions will not be filled after 5pm or on week-ends. °3. You should follow a light diet the first few days after arrival home, such as soup and crackers, etc.  Resume your normal diet the day after surgery. °4. Most patients will experience some swelling and bruising on the chest and underarm.  Ice packs will help.  Swelling and bruising can take several days to resolve.  °5. It is common to experience some constipation if taking pain medication after surgery.  Increasing fluid intake and taking a stool softener (such as Colace) will usually help or prevent this problem from occurring.  A mild laxative (Milk of Magnesia or Miralax) should be taken according to package instructions if there are no bowel movements after 48 hours. °6. Unless discharge instructions indicate otherwise, leave your bandage dry and in place until your next appointment in 3-5 days.  You may take a limited sponge bath.  No tube baths or showers until the drains are removed.  You may have steri-strips (small skin tapes) in place directly over the incision.  These strips should be left on the skin for 7-10 days.  If your surgeon used skin glue on the incision, you may  shower in 24 hours.  The glue will flake off over the next 2-3 weeks.  Any sutures or staples will be removed at the office during your follow-up visit. °7. DRAINS:  If you have drains in place, it is important to keep a list of the amount of drainage produced each day in your drains.  Before leaving the hospital, you should be instructed on drain care.  Call our office if you have any questions about your drains. °8. ACTIVITIES:  You may resume regular (light) daily activities beginning the next day--such as daily self-care, walking, climbing stairs--gradually increasing activities as tolerated.  You may have sexual intercourse when it is comfortable.  Refrain from any heavy lifting or straining until approved by your doctor. °a. You may drive when you are no longer taking prescription pain medication, you can comfortably wear a seatbelt, and you can safely maneuver your car and apply brakes. °b. RETURN TO WORK:  __________________________________________________________ °9. You should see your doctor in the office for a follow-up appointment approximately 3-5 days after your surgery.  Your doctor’s nurse will typically make your follow-up appointment when she calls you with your pathology report.  Expect your pathology report 2-3 business days after your surgery.  You may call to check if you do not hear from us after three days.   °10. OTHER INSTRUCTIONS: ______________________________________________________________________________________________ ____________________________________________________________________________________________ °WHEN TO CALL YOUR DOCTOR: °1. Fever over 101.0 °2. Nausea and/or vomiting °3. Extreme swelling or bruising °4. Continued bleeding from incision. °5. Increased pain, redness, or drainage from the incision. °  The clinic staff is available to answer your questions during regular business hours.  Please don’t hesitate to call and ask to speak to one of the nurses for clinical  concerns.  If you have a medical emergency, go to the nearest emergency room or call 911.  A surgeon from [facility] is always on call at the hospital. °[address], [address], [place], [place]  [address] ? [address] [address], [place], [place]   [address] °[phone] ? [phone] ? FAX [phone] °Web site: [email or web address] °

## 2016-11-06 NOTE — Progress Notes (Signed)
Patient discharged to home with instructions and prescription, demonstrated JP care.

## 2016-11-07 DIAGNOSIS — C50911 Malignant neoplasm of unspecified site of right female breast: Secondary | ICD-10-CM | POA: Diagnosis not present

## 2016-11-09 ENCOUNTER — Ambulatory Visit (HOSPITAL_BASED_OUTPATIENT_CLINIC_OR_DEPARTMENT_OTHER): Payer: Medicare HMO | Admitting: Oncology

## 2016-11-09 ENCOUNTER — Other Ambulatory Visit: Payer: Self-pay | Admitting: *Deleted

## 2016-11-09 ENCOUNTER — Inpatient Hospital Stay (HOSPITAL_BASED_OUTPATIENT_CLINIC_OR_DEPARTMENT_OTHER): Payer: Medicare HMO

## 2016-11-09 VITALS — BP 140/81 | HR 67 | Temp 97.7°F | Resp 20 | Ht 67.0 in | Wt 264.8 lb

## 2016-11-09 DIAGNOSIS — C773 Secondary and unspecified malignant neoplasm of axilla and upper limb lymph nodes: Secondary | ICD-10-CM | POA: Diagnosis not present

## 2016-11-09 DIAGNOSIS — C50411 Malignant neoplasm of upper-outer quadrant of right female breast: Secondary | ICD-10-CM

## 2016-11-09 DIAGNOSIS — G8918 Other acute postprocedural pain: Secondary | ICD-10-CM | POA: Diagnosis not present

## 2016-11-09 DIAGNOSIS — Z171 Estrogen receptor negative status [ER-]: Secondary | ICD-10-CM | POA: Diagnosis not present

## 2016-11-09 DIAGNOSIS — G620 Drug-induced polyneuropathy: Secondary | ICD-10-CM | POA: Diagnosis not present

## 2016-11-09 DIAGNOSIS — C50911 Malignant neoplasm of unspecified site of right female breast: Secondary | ICD-10-CM

## 2016-11-09 DIAGNOSIS — B20 Human immunodeficiency virus [HIV] disease: Secondary | ICD-10-CM

## 2016-11-09 LAB — CBC WITH DIFFERENTIAL/PLATELET
BASO%: 0.4 % (ref 0.0–2.0)
BASOS ABS: 0 10*3/uL (ref 0.0–0.1)
EOS%: 4.6 % (ref 0.0–7.0)
Eosinophils Absolute: 0.4 10*3/uL (ref 0.0–0.5)
HEMATOCRIT: 45 % (ref 34.8–46.6)
HGB: 14.9 g/dL (ref 11.6–15.9)
LYMPH#: 3.4 10*3/uL — AB (ref 0.9–3.3)
LYMPH%: 42.6 % (ref 14.0–49.7)
MCH: 31.9 pg (ref 25.1–34.0)
MCHC: 33.1 g/dL (ref 31.5–36.0)
MCV: 96.4 fL (ref 79.5–101.0)
MONO#: 0.8 10*3/uL (ref 0.1–0.9)
MONO%: 9.6 % (ref 0.0–14.0)
NEUT#: 3.4 10*3/uL (ref 1.5–6.5)
NEUT%: 42.8 % (ref 38.4–76.8)
Platelets: 178 10*3/uL (ref 145–400)
RBC: 4.67 10*6/uL (ref 3.70–5.45)
RDW: 14.4 % (ref 11.2–14.5)
WBC: 8 10*3/uL (ref 3.9–10.3)

## 2016-11-09 LAB — COMPREHENSIVE METABOLIC PANEL
ALK PHOS: 69 U/L (ref 40–150)
ALT: 16 U/L (ref 0–55)
ANION GAP: 9 meq/L (ref 3–11)
AST: 14 U/L (ref 5–34)
Albumin: 3.6 g/dL (ref 3.5–5.0)
BUN: 23.9 mg/dL (ref 7.0–26.0)
CALCIUM: 11.2 mg/dL — AB (ref 8.4–10.4)
CHLORIDE: 105 meq/L (ref 98–109)
CO2: 30 mEq/L — ABNORMAL HIGH (ref 22–29)
Creatinine: 1.4 mg/dL — ABNORMAL HIGH (ref 0.6–1.1)
EGFR: 48 mL/min/{1.73_m2} — ABNORMAL LOW (ref >90)
Glucose: 85 mg/dl (ref 70–140)
POTASSIUM: 5.1 meq/L (ref 3.5–5.1)
Sodium: 144 mEq/L (ref 136–145)
Total Bilirubin: 0.66 mg/dL (ref 0.20–1.20)
Total Protein: 7.7 g/dL (ref 6.4–8.3)

## 2016-11-09 MED ORDER — GABAPENTIN 300 MG PO CAPS: 300.0000 mg | ORAL_CAPSULE | Freq: Four times a day (QID) | ORAL | 4 refills | Status: DC

## 2016-11-09 MED ORDER — HYDROCODONE-ACETAMINOPHEN 5-325 MG PO TABS: 1.0000 | ORAL_TABLET | Freq: Four times a day (QID) | ORAL | 0 refills | Status: DC | PRN

## 2016-11-09 NOTE — Telephone Encounter (Signed)
Patient requested and will pick up 

## 2016-11-09 NOTE — Progress Notes (Signed)
[place]  [phone] [phone]     ID: Kayla Price DOB: February 14, 1956  MR#: [id number]  PPJ#:[id number]  Patient Care Team: Lauree Chandler, NP as PCP - General (Nurse Practitioner) Tommy Medal, Lavell Islam, MD as PCP - Infectious Diseases (Infectious Diseases) Clent Jacks, MD as Consulting Physician (Ophthalmology) Renato Shin, MD as Consulting Physician (Endocrinology) Frederik Pear, MD as Consulting Physician (Orthopedic Surgery) Magrinat, Virgie Dad, MD as Consulting Physician (Oncology) Chauncey Cruel, MD OTHER MD:  CHIEF COMPLAINT: Triple negative breast cancer  CURRENT TREATMENT: Adjuvant chemotherapy   BREAST CANCER HISTORY: From the original intake note:  Kayla Price is a history of right-sided breast cancer dating back to 2005. She had a lumpectomy with sentinel lymph node sampling, chemotherapy, and radiation. I do not have access to those records at present.  More recently she had bilateral screening mammography at the [facility] 09/25/2016 showing a possible mass in the right breast. Diagnostic mammography with ultrasonography on 09/28/2016 the patient underwent right diagnostic mammography with tomography and right breast ultrasonography. The breast density was category A. In the right breast at the 10:00 position there was an irregular mass measuring 2.5 cm. Ultrasound identified this the 10:00 radiant 10 cm from the nipple measuring 2.4 cm. In the right axilla there was an abnormal lymph node measuring 1.3 cm with other normal-appearing lymph nodes.  On 10/01/2016 she  underwent biopsy of the right breast mass in question as well as the suspicious axillary lymph node. 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 The proliferation marker was 70% in the breast lesion and 50% in the  lymph node.  Her subsequent history is as detailed below.   INTERVAL HISTORY: Kayla Price returns today for follow-up of her triple negative breast cancer accompanied by her sister. Since her last visit here Kayla Price underwent right modified radical mastectomy 11/05/2016. The final pathology from this procedure (SCA 18-02/07/2007 showed invasive ductal carcinoma measuring 2.6 cm, with negative margins. 2 of 10 lymph nodes dissected were involved by tumor both with macro metastases.  Kayla Price did generally well with the surgery, which she described as a "right through", because she started at 7:30 and was out of thereby 11:30. She also had a port placed in anticipation of chemotherapy.  REVIEW OF SYSTEMS: Kayla Price still has pain from the surgery. She is taking Robaxin for this. She does not like to take narcotics and she tells me that the nonsteroidals like Motrin do not work for her. She has pain in the upper arm as well. This is intermittent. She has arthritis pains here and there which are not more intense or persistent than before. She has stress urinary incontinence. There are no symptoms related to her HIV and she tolerates those medications well. On direct questioning she tells me that she does have some numbness in her fingers and toes from the original chemotherapy more than 10 years ago. A detailed review of systems today was otherwise stable     PAST MEDICAL HISTORY: Past Medical History:  Diagnosis Date  . Alopecia areata 11/28/2009  . Bell's palsy   . BREAST CANCER, HX OF 2005  . Cancer [facility]) 2005   Breast cancer   chemotherapy and radiation  . CKD (chronic kidney disease) stage 3, GFR 30-59 ml/min 06/08/2015  . Dry eye syndrome   . Fasting hyperglycemia   . HIP FRACTURE, RIGHT 05/06/2008  .  HIV (human immunodeficiency virus infection) ([place])   . HIV DISEASE 03/27/2006  . HYPERLIPIDEMIA, MIXED 12/15/2007  . HYPERTENSION 03/27/2006  . 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  MENORRHAGIA, POSTMENOPAUSAL 02/03/2009  . Osteoarthritis of left knee 06/08/2015  . OSTEOARTHROSIS, LOCAL, SCND, UNSPC SITE 04/07/2007  . PVD 04/07/2007  . Tinea capitis   . TRIGGER FINGER 05/06/2008  . Unspecified vitamin D deficiency 08/06/2007    PAST SURGICAL HISTORY: Past Surgical History:  Procedure Laterality Date  . BREAST LUMPECTOMY Right    2005  . COLONOSCOPY    . HYSTEROSCOPY  2006  . KNEE ARTHROSCOPY Left 2002  . LYMPH NODE DISSECTION  2005  . MASTECTOMY MODIFIED RADICAL Right 11/05/2016   Procedure: RIGHT MASTECTOMY MODIFIED RADICAL;  Surgeon: Fanny Skates, MD;  Location: [facility];  Service: General;  Laterality: Right;  . MODIFIED RADICAL MASTECTOMY Right 11/05/2016  . placement   of port-a-cath    . PORT-A-CATH REMOVAL  2006   insertion 2005  . PORTACATH PLACEMENT Right 11/05/2016   Procedure: INSERTION PORT-A-CATH WITH ULTRA SOUND;  Surgeon: Fanny Skates, MD;  Location: [facility];  Service: General;  Laterality: Right;  . removal of port a cath  12/2014  . THYROID SURGERY  2009   Ablation   . TOTAL KNEE ARTHROPLASTY Left 02/06/2016   Procedure: TOTAL KNEE ARTHROPLASTY;  Surgeon: Frederik Pear, MD;  Location: [facility];  Service: Orthopedics;  Laterality: Left;  . TUBAL LIGATION      FAMILY HISTORY Family History  Problem Relation Age of Onset  . Heart attack Brother        Massive MI in 66s  . Stroke Brother        CAD  . Kidney disease Mother   . Stroke Mother   . Diabetes Mother   . Liver disease Sister   . COPD Sister        had I-131 rx of hyperthyroidism  . Diabetes Sister   . Stroke Sister   . Cancer Paternal Uncle   . Cancer Cousin   . Heart failure Father   . Heart disease Father   . Arthritis Father   . Sarcoidosis Sister   The patient's father died at the age of 17 in the setting of Alzheimer's disease. The patient's mother died at the age of 81 from complications of diabetes. The patient has 3 brothers, 4 sisters. There is no history of breast or  ovarian cancer in the family area   GYNECOLOGIC HISTORY:  No LMP recorded. Patient has had an ablation.  menarche age 10, first live birth age 54, the patient is [place] P2. She stopped having periods in 2005, with her chemotherapy; she never took hormone replacement   SOCIAL HISTORY:  Murdered work for the [employer] more than 20 years. 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 At home she lives with her son Forestine Na, her daughter [name] who works for [employer] and her granddaughter Donita Brooks, 82 y/o AS OF APRIL 2018     ADVANCED DIRECTIVES:  not in place    HEALTH MAINTENANCE: Social History  Substance Use Topics  . Smoking status: Never Smoker  . Smokeless tobacco: Never Used  . Alcohol use No     Colonoscopy:  PAP:  Bone density:   Allergies  Allergen Reactions  . Lisinopril Anaphylaxis and Swelling    Swelling of tongue and mouth 11/05/16- tolerates Olmesartan    Current Outpatient Prescriptions  Medication Sig  Dispense Refill  . Cholecalciferol (VITAMIN D3) 2000 units TABS Take 2,000 Units by mouth daily with lunch.    . co-enzyme Q-10 30 MG capsule Take 1 tablet by mouth daily.    . darunavir-cobicistat (PREZCOBIX) 800-150 MG tablet TAKE 1 TABLET BY MOUTH DAILY. SWALLOW WHOLE. DO NOT CRUSH, BREAK OR CHEW TABLETS. TAKE WITH FOOD. (Patient taking differently: Take 1 tablet by mouth every evening. TAKE 1 TABLET BY MOUTH DAILY. SWALLOW WHOLE. DO NOT CRUSH, BREAK OR CHEW TABLETS. TAKE WITH FOOD.) 30 tablet 11  . dolutegravir (TIVICAY) 50 MG tablet Take 1 tablet (50 mg total) by mouth daily. (Patient taking differently: Take 50 mg by mouth every evening. ) 30 tablet 11  . gabapentin (NEURONTIN) 300 MG capsule Take 1 capsule (300 mg total) by mouth 4 (four) times daily. 120 capsule 4  . HYDROcodone-acetaminophen (NORCO/VICODIN) 5-325 MG tablet Take 1 tablet by mouth every 6 (six) hours as needed for moderate pain. 120 tablet 0  .  levothyroxine (SYNTHROID, LEVOTHROID) 175 MCG tablet Take 1 tablet (175 mcg total) by mouth daily before breakfast. 90 tablet 1  . loratadine (CLARITIN) 10 MG tablet Take 10 mg by mouth daily as needed for itching. Reported on 06/08/2015    . methocarbamol (ROBAXIN) 500 MG tablet Take 500 mg by mouth every 8 (eight) hours as needed for muscle spasms.    Marland Kitchen olmesartan-hydrochlorothiazide (BENICAR HCT) 40-25 MG tablet TAKE 1 TABLET EVERY DAY (Patient taking differently: TAKE 1 TABLET EVERY DAY WITH LUNCH) 90 tablet 1  . Omega-3 Fatty Acids (FISH OIL PO) Take 1 capsule by mouth daily with lunch.    . Pitavastatin Calcium 4 MG TABS Take 1 tablet (4 mg total) by mouth daily. This may be placebo, study provided. Do not dispense. (Patient taking differently: Take 4 mg by mouth daily with lunch. This may be placebo, study provided. Do not dispense.) 30 tablet 11  . Probiotic Product (PROBIOTIC PO) Take 1 capsule by mouth daily with lunch.    . rilpivirine (EDURANT) 25 MG TABS tablet TAKE 1 TABLET BY MOUTH EVERY DAY WITH BREAKFAST (Patient taking differently: Take 25 mg by mouth every evening. ) 30 tablet 11   No current facility-administered medications for this visit.     OBJECTIVE: Middle-aged African-American woman In no acute distress  Vitals:   11/09/16 1118  BP: 140/81  Pulse: 67  Resp: 20  Temp: 97.7 F (36.5 C)     Body mass index is 41.47 kg/m.    ECOG FS:1 - Symptomatic but completely ambulatory  Sclerae unicteric, EOMs intact Oropharynx clear and moist No cervical or supraclavicular adenopathy Lungs no rales or rhonchi Heart regular rate and rhythm Abd soft, nontender, positive bowel sounds MSK no focal spinal tenderness, no upper extremity lymphedema Neuro: nonfocal, well oriented, appropriate affect Breasts: The right breast is status post recent mastectomy. The incision is healing nicely. There is no erythema or dehiscence. The right axilla is benign. Left breast is unremarkable.  The left axilla is benign   LAB RESULTS:  CMP     Component Value Date/Time   NA 144 11/09/2016 1106   K 5.1 11/09/2016 1106   CL 102 11/06/2016 0333   CO2 30 (H) 11/09/2016 1106   GLUCOSE 85 11/09/2016 1106   BUN 23.9 11/09/2016 1106   CREATININE 1.4 (H) 11/09/2016 1106   CALCIUM 11.2 (H) 11/09/2016 1106   PROT 7.7 11/09/2016 1106   ALBUMIN 3.6 11/09/2016 1106   AST 14 11/09/2016 1106  ALT 16 11/09/2016 1106   ALKPHOS 69 11/09/2016 1106   BILITOT 0.66 11/09/2016 1106   GFRNONAA 43 (L) 11/06/2016 0333   GFRNONAA 42 (L) 09/18/2016 0834   GFRAA 50 (L) 11/06/2016 0333   GFRAA 48 (L) 09/18/2016 0834    No results found for: TOTALPROTELP, ALBUMINELP, A1GS, A2GS, BETS, BETA2SER, GAMS, MSPIKE, SPEI  No results found for: Nils Pyle, [facility]  Lab Results  Component Value Date   WBC 8.0 11/09/2016   NEUTROABS 3.4 11/09/2016   HGB 14.9 11/09/2016   HCT 45.0 11/09/2016   MCV 96.4 11/09/2016   PLT 178 11/09/2016      Chemistry      Component Value Date/Time   NA 144 11/09/2016 1106   K 5.1 11/09/2016 1106   CL 102 11/06/2016 0333   CO2 30 (H) 11/09/2016 1106   BUN 23.9 11/09/2016 1106   CREATININE 1.4 (H) 11/09/2016 1106   GLU 104 02/13/2016 1358      Component Value Date/Time   CALCIUM 11.2 (H) 11/09/2016 1106   ALKPHOS 69 11/09/2016 1106   AST 14 11/09/2016 1106   ALT 16 11/09/2016 1106   BILITOT 0.66 11/09/2016 1106       Lab Results  Component Value Date   LABCA2 11 04/20/2008    No components found for: QASTMH962  No results for input(s): INR in the last 168 hours.  Urinalysis    Component Value Date/Time   COLORURINE YELLOW 01/27/2016 1028   APPEARANCEUR CLEAR 01/27/2016 1028   LABSPEC 1.028 01/27/2016 1028   PHURINE 5.5 01/27/2016 1028   GLUCOSEU NEGATIVE 01/27/2016 1028   GLUCOSEU NEG mg/dL 07/23/2006 2113   HGBUR NEGATIVE 01/27/2016 1028   BILIRUBINUR NEGATIVE 01/27/2016 1028   KETONESUR NEGATIVE 01/27/2016 1028    PROTEINUR NEGATIVE 01/27/2016 1028   UROBILINOGEN 1 07/23/2006 2113   NITRITE NEGATIVE 01/27/2016 1028   LEUKOCYTESUR NEGATIVE 01/27/2016 1028     STUDIES: Dg Chest Port 1 View  Result Date: 11/05/2016 CLINICAL DATA:  Status post Port-A-Cath insertion EXAM: PORTABLE CHEST 1 VIEW COMPARISON:  None FINDINGS: There is a right subclavian port a catheter with tip at the projection of the distal SVC. The heart size appears normal. No pleural effusion or edema. No airspace opacities. A surgical drain overlies the right chest wall. IMPRESSION: 1. Right subclavian port a catheter is identified with tip in the projection of the distal SVC. Electronically Signed   By: Kerby Moors M.D.   On: 11/05/2016 11:06   Dg Fluoro Guide Cv Line-no Report  Result Date: 11/05/2016 Fluoroscopy was utilized by the requesting physician.  No radiographic interpretation.    ELIGIBLE FOR AVAILABLE RESEARCH PROTOCOL: no  ASSESSMENT: 61 y.o. [place] woman   (1) status post right lumpectomy and sentinel lymph node sampling October 2005 for a 0.6 cm invasive ductal carcinoma involving one out of 2 sentinel lymph nodes sampled, grade 3, triple-negative, treated adjuvantly with doxorubicin and cyclophosphamide 4 followed by weekly paclitaxel 7, followed by adjuvant radiation  (2) status post right breast upper outer quadrant biopsy and right axillary lymph node biopsy 10/01/2016, both positive for a T2 N1, stage IIIB invasive ductal carcinoma, triple negative, with an MIB-1 of 50-70%  (3) status post right modified radical mastectomy 11/05/2016 showing a pT2 pN1, stage IIIB invasive ductal carcinoma, grade 3, triple negative, with negative margins  (4) Not a candidate for radiation given prior history  (5) adjuvant chemotherapy will consist of carboplatin and gemcitabine given days 1 and 8 of each  21 day cycle, for 6 cycles  (5) HIV positivity  PLAN: I spent approximately 30 minutes with Kayla Price with most of  that time spent discussing her complex problems. We discussed her pathology, which showed 2 positive lymph nodes but negative margins. Normally she would receive radiation to decrease the risk of local recurrence, but since she already had radiation to this area that is not a possibility so we are proceeding directly to adjuvant chemotherapy.   She already had anthracyclines. We cannot give her anymore of those drugs because of concern regarding cardiotoxicity. 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 Accordingly we cannot use taxanes.  Carboplatin and Gemzar is a very reasonable active combination in this setting and that is what we discussed. She will receive those drugs on days 1 and 8 of each 21 day cycle. She has a good understanding of the possible toxicities, side effects and complications of these agents. I gave her a road map on how to take her supportive medications and put in those prescriptions for her.  I also wrote her for gabapentin for control of the postoperative pain, which she describes as a burning, and so has a neuropathic component. She will let me know this works for her. If not we can try tramadol.  We are going to wait on until May 29 to start her chemotherapy, to give her a little bit more time to recover from her surgery. We will see her with each treatment.  She knows to call for any other problems that may develop before her next visit here.  Chauncey Cruel, MD   11/10/2016 11:40 AM Medical Oncology and Hematology [facility] [address] [place], [place] [address] Tel. [phone]    Fax. [phone]

## 2016-11-09 NOTE — Progress Notes (Signed)
START OFF PATHWAY REGIMEN - Breast   [id number]:Gemcitabine + Carboplatin (1000/2) q21 Days:   A cycle is every 21 days:     Gemcitabine      Carboplatin   **Always confirm dose/schedule in your pharmacy ordering system**    Patient Characteristics: Locoregional  Recurrent Disease - Resected, ER Negative, HER2 Negative/Unknown/Equivocal Therapeutic Status: Locoregional Recurrent Disease - Resected ER Status: Negative (-) HER2 Status: Negative (-) PR Status: Negative (-)  Intent of Therapy: Curative Intent, Discussed with Patient

## 2016-11-10 MED ORDER — PROCHLORPERAZINE MALEATE 10 MG PO TABS: 10.0000 mg | ORAL_TABLET | Freq: Four times a day (QID) | ORAL | 1 refills | Status: DC | PRN

## 2016-11-10 MED ORDER — DEXAMETHASONE 4 MG PO TABS: 8.0000 mg | ORAL_TABLET | Freq: Every day | ORAL | 1 refills | Status: DC

## 2016-11-10 MED ORDER — LIDOCAINE-PRILOCAINE 2.5-2.5 % EX CREA
TOPICAL_CREAM | CUTANEOUS | 3 refills | Status: DC
Start: 2016-11-10 — End: 2017-03-29

## 2016-11-10 MED ORDER — LORAZEPAM 0.5 MG PO TABS: 0.5000 mg | ORAL_TABLET | Freq: Every evening | ORAL | 0 refills | Status: DC | PRN

## 2016-11-12 ENCOUNTER — Encounter: Payer: Self-pay | Admitting: *Deleted

## 2016-11-12 ENCOUNTER — Other Ambulatory Visit: Payer: 59

## 2016-11-12 VITALS — BP 123/79 | HR 72 | Temp 98.0°F | Wt 261.8 lb

## 2016-11-12 DIAGNOSIS — Z006 Encounter for examination for normal comparison and control in clinical research program: Secondary | ICD-10-CM | POA: Diagnosis not present

## 2016-11-12 LAB — COMPREHENSIVE METABOLIC PANEL
ALK PHOS: 79 U/L (ref 33–130)
ALT: 11 U/L (ref 6–29)
AST: 12 U/L (ref 10–35)
Albumin: 3.8 g/dL (ref 3.6–5.1)
BILIRUBIN TOTAL: 0.7 mg/dL (ref 0.2–1.2)
BUN: 20 mg/dL (ref 7–25)
CO2: 30 mmol/L (ref 20–31)
Calcium: 10.4 mg/dL (ref 8.6–10.4)
Chloride: 103 mmol/L (ref 98–110)
Creat: 1.41 mg/dL — ABNORMAL HIGH (ref 0.50–0.99)
Glucose, Bld: 87 mg/dL (ref 65–99)
POTASSIUM: 4 mmol/L (ref 3.5–5.3)
Sodium: 143 mmol/L (ref 135–146)
Total Protein: 7.3 g/dL (ref 6.1–8.1)

## 2016-11-12 NOTE — Progress Notes (Signed)
Rain is here for her 12 month visit for Reprieve, A Randomized Trial to Prevent Vascular Events in HIV (study drug is Pitavastatin '4mg'$  or placebo).  She had a rt mastectomy last week and is doing great. She had a recurrence of invasive ductal carcinoma of the rt breast. She still has 2 drains in. She says her adherence to the study med is great and even brought them to the hospital when she was operated on. She will chemo starting a a few weeks. She will be returning in October for the next visit.

## 2016-11-14 ENCOUNTER — Encounter: Payer: Self-pay | Admitting: Oncology

## 2016-11-14 ENCOUNTER — Other Ambulatory Visit (HOSPITAL_COMMUNITY)
Admission: RE | Admit: 2016-11-14 | Discharge: 2016-11-14 | Disposition: A | Discharge status: Home / Self Care | Payer: Medicare HMO | Source: Ambulatory Visit | Attending: Infectious Disease | Admitting: Infectious Disease

## 2016-11-14 DIAGNOSIS — Z113 Encounter for screening for infections with a predominantly sexual mode of transmission: Secondary | ICD-10-CM | POA: Diagnosis not present

## 2016-11-14 NOTE — Progress Notes (Signed)
Called patient to introduce myself as Arboriculturist and to discuss possible assistance she may apply for. Patient has 2 insurances therefore no co-pay assistance is needed. Advised patient of [employer] grant and expenses it may be used for. Asked patient for verbal income. Patient had access to her exact amounts. Based on the information patient provided and being a household of 1, patient is over the income for the [employer]. Advised patient if anything else becomes available that she may qualify for, I will reach out to her and let her know. Patient was very appreciative and she verbalized understanding. Patient has my name and number for any additional financial questions or concerns.

## 2016-11-15 ENCOUNTER — Other Ambulatory Visit: Payer: Medicare HMO

## 2016-11-15 ENCOUNTER — Other Ambulatory Visit: Payer: Self-pay | Admitting: Licensed Clinical Social Worker

## 2016-11-15 DIAGNOSIS — Z79899 Other long term (current) drug therapy: Secondary | ICD-10-CM | POA: Diagnosis not present

## 2016-11-15 DIAGNOSIS — B20 Human immunodeficiency virus [HIV] disease: Secondary | ICD-10-CM

## 2016-11-15 DIAGNOSIS — Z113 Encounter for screening for infections with a predominantly sexual mode of transmission: Secondary | ICD-10-CM

## 2016-11-15 LAB — LIPID PANEL
CHOLESTEROL: 145 mg/dL (ref <200)
HDL: 54 mg/dL (ref >50)
LDL Cholesterol: 76 mg/dL (ref <100)
TRIGLYCERIDES: 77 mg/dL (ref <150)
Total CHOL/HDL Ratio: 2.7 Ratio (ref <5.0)
VLDL: 15 mg/dL (ref <30)

## 2016-11-16 ENCOUNTER — Ambulatory Visit (HOSPITAL_COMMUNITY)
Admission: RE | Admit: 2016-11-16 | Discharge: 2016-11-16 | Disposition: A | Discharge status: Home / Self Care | Payer: Medicare HMO | Source: Ambulatory Visit | Attending: Oncology | Admitting: Oncology

## 2016-11-16 ENCOUNTER — Encounter (HOSPITAL_COMMUNITY): Payer: Self-pay | Admitting: Radiology

## 2016-11-16 DIAGNOSIS — C50411 Malignant neoplasm of upper-outer quadrant of right female breast: Secondary | ICD-10-CM | POA: Diagnosis not present

## 2016-11-16 DIAGNOSIS — Z9011 Acquired absence of right breast and nipple: Secondary | ICD-10-CM | POA: Insufficient documentation

## 2016-11-16 DIAGNOSIS — Z171 Estrogen receptor negative status [ER-]: Secondary | ICD-10-CM | POA: Insufficient documentation

## 2016-11-16 DIAGNOSIS — K5790 Diverticulosis of intestine, part unspecified, without perforation or abscess without bleeding: Secondary | ICD-10-CM | POA: Diagnosis not present

## 2016-11-16 DIAGNOSIS — K8020 Calculus of gallbladder without cholecystitis without obstruction: Secondary | ICD-10-CM | POA: Diagnosis not present

## 2016-11-16 DIAGNOSIS — R911 Solitary pulmonary nodule: Secondary | ICD-10-CM | POA: Insufficient documentation

## 2016-11-16 DIAGNOSIS — C50911 Malignant neoplasm of unspecified site of right female breast: Secondary | ICD-10-CM

## 2016-11-16 LAB — T-HELPER CELL (CD4) - (RCID CLINIC ONLY)
CD4 % Helper T Cell: 36 % (ref 33–55)
CD4 T Cell Abs: 760 /uL (ref 400–2700)

## 2016-11-16 MED ORDER — IOPAMIDOL (ISOVUE-300) INJECTION 61%
100.0000 mL | Freq: Once | INTRAVENOUS | Status: AC | PRN
  Administered 2016-11-16: 100 mL via INTRAVENOUS

## 2016-11-16 MED ORDER — IOPAMIDOL (ISOVUE-300) INJECTION 61%
INTRAVENOUS | Status: AC
  Filled 2016-11-16: qty 100

## 2016-11-20 ENCOUNTER — Ambulatory Visit (HOSPITAL_BASED_OUTPATIENT_CLINIC_OR_DEPARTMENT_OTHER): Payer: Medicare HMO

## 2016-11-20 ENCOUNTER — Other Ambulatory Visit (HOSPITAL_BASED_OUTPATIENT_CLINIC_OR_DEPARTMENT_OTHER): Payer: Medicare HMO

## 2016-11-20 ENCOUNTER — Ambulatory Visit (HOSPITAL_BASED_OUTPATIENT_CLINIC_OR_DEPARTMENT_OTHER): Payer: Medicare HMO | Admitting: Oncology

## 2016-11-20 VITALS — BP 141/86 | HR 71 | Temp 97.5°F | Resp 18 | Ht 67.0 in | Wt 261.6 lb

## 2016-11-20 DIAGNOSIS — C773 Secondary and unspecified malignant neoplasm of axilla and upper limb lymph nodes: Secondary | ICD-10-CM

## 2016-11-20 DIAGNOSIS — C50411 Malignant neoplasm of upper-outer quadrant of right female breast: Secondary | ICD-10-CM

## 2016-11-20 DIAGNOSIS — Z171 Estrogen receptor negative status [ER-]: Principal | ICD-10-CM

## 2016-11-20 DIAGNOSIS — C50911 Malignant neoplasm of unspecified site of right female breast: Secondary | ICD-10-CM

## 2016-11-20 DIAGNOSIS — B20 Human immunodeficiency virus [HIV] disease: Secondary | ICD-10-CM

## 2016-11-20 DIAGNOSIS — Z5111 Encounter for antineoplastic chemotherapy: Secondary | ICD-10-CM | POA: Diagnosis not present

## 2016-11-20 DIAGNOSIS — R938 Abnormal findings on diagnostic imaging of other specified body structures: Secondary | ICD-10-CM

## 2016-11-20 LAB — CBC WITH DIFFERENTIAL/PLATELET
BASO%: 1.1 % (ref 0.0–2.0)
BASOS ABS: 0.1 10*3/uL (ref 0.0–0.1)
EOS ABS: 0.3 10*3/uL (ref 0.0–0.5)
EOS%: 5.8 % (ref 0.0–7.0)
HCT: 42.3 % (ref 34.8–46.6)
HEMOGLOBIN: 14.3 g/dL (ref 11.6–15.9)
LYMPH%: 43 % (ref 14.0–49.7)
MCH: 32.3 pg (ref 25.1–34.0)
MCHC: 33.9 g/dL (ref 31.5–36.0)
MCV: 95.4 fL (ref 79.5–101.0)
MONO#: 0.5 10*3/uL (ref 0.1–0.9)
MONO%: 10.5 % (ref 0.0–14.0)
NEUT#: 1.9 10*3/uL (ref 1.5–6.5)
NEUT%: 39.6 % (ref 38.4–76.8)
Platelets: 186 10*3/uL (ref 145–400)
RBC: 4.43 10*6/uL (ref 3.70–5.45)
RDW: 14.1 % (ref 11.2–14.5)
WBC: 4.8 10*3/uL (ref 3.9–10.3)
lymph#: 2.1 10*3/uL (ref 0.9–3.3)

## 2016-11-20 LAB — URINE CYTOLOGY ANCILLARY ONLY
CHLAMYDIA, DNA PROBE: NEGATIVE
Neisseria Gonorrhea: NEGATIVE

## 2016-11-20 MED ORDER — SODIUM CHLORIDE 0.9 % IV SOLN
210.0000 mg | Freq: Once | INTRAVENOUS | Status: AC
  Administered 2016-11-20: 210 mg via INTRAVENOUS
  Filled 2016-11-20: qty 21

## 2016-11-20 MED ORDER — SODIUM CHLORIDE 0.9 % IV SOLN
Freq: Once | INTRAVENOUS | Status: AC
  Administered 2016-11-20: 14:00:00 via INTRAVENOUS

## 2016-11-20 MED ORDER — SODIUM CHLORIDE 0.9% FLUSH
10.0000 mL | INTRAVENOUS | Status: DC | PRN
  Administered 2016-11-20: 10 mL
  Filled 2016-11-20: qty 10

## 2016-11-20 MED ORDER — SODIUM CHLORIDE 0.9 % IV SOLN
2400.0000 mg | Freq: Once | INTRAVENOUS | Status: AC
  Administered 2016-11-20: 2400 mg via INTRAVENOUS
  Filled 2016-11-20: qty 63.12

## 2016-11-20 MED ORDER — DEXAMETHASONE SODIUM PHOSPHATE 10 MG/ML IJ SOLN
10.0000 mg | Freq: Once | INTRAMUSCULAR | Status: AC
  Administered 2016-11-20: 10 mg via INTRAVENOUS

## 2016-11-20 MED ORDER — HEPARIN SOD (PORK) LOCK FLUSH 100 UNIT/ML IV SOLN
500.0000 [IU] | Freq: Once | INTRAVENOUS | Status: AC | PRN
  Administered 2016-11-20: 500 [IU]
  Filled 2016-11-20: qty 5

## 2016-11-20 MED ORDER — PALONOSETRON HCL INJECTION 0.25 MG/5ML
0.2500 mg | Freq: Once | INTRAVENOUS | Status: AC
  Administered 2016-11-20: 0.25 mg via INTRAVENOUS

## 2016-11-20 MED ORDER — DEXAMETHASONE SODIUM PHOSPHATE 10 MG/ML IJ SOLN
INTRAMUSCULAR | Status: AC
  Filled 2016-11-20: qty 1

## 2016-11-20 MED ORDER — PALONOSETRON HCL INJECTION 0.25 MG/5ML
INTRAVENOUS | Status: AC
  Filled 2016-11-20: qty 5

## 2016-11-20 NOTE — Patient Instructions (Signed)
[facility] Discharge Instructions for Patients Receiving Chemotherapy  Today you received the following chemotherapy agents:  Gemzar and Carboplatin.  To help prevent nausea and vomiting after your treatment, we encourage you to take your nausea medication as directed.   If you develop nausea and vomiting that is not controlled by your nausea medication, call the clinic.   BELOW ARE SYMPTOMS THAT SHOULD BE REPORTED IMMEDIATELY:  *FEVER GREATER THAN 100.5 F  *CHILLS WITH OR WITHOUT FEVER  NAUSEA AND VOMITING THAT IS NOT CONTROLLED WITH YOUR NAUSEA MEDICATION  *UNUSUAL SHORTNESS OF BREATH  *UNUSUAL BRUISING OR BLEEDING  TENDERNESS IN MOUTH AND THROAT WITH OR WITHOUT PRESENCE OF ULCERS  *URINARY PROBLEMS  *BOWEL PROBLEMS  UNUSUAL RASH Items with * indicate a potential emergency and should be followed up as soon as possible.  Feel free to call the clinic you have any questions or concerns. The clinic phone number is [phone].  Please show the [facility] at check-in to the Emergency Department and triage nurse.  Gemcitabine injection What is this medicine? GEMCITABINE (jem SIT a been) is a chemotherapy drug. This medicine is used to treat many types of cancer like breast cancer, lung cancer, pancreatic cancer, and ovarian cancer. This medicine may be used for other purposes; ask your health care provider or pharmacist if you have questions. COMMON BRAND NAME(S): Gemzar What should I tell my health care provider before I take this medicine? They need to know if you have any of these conditions: -blood disorders -infection -kidney disease -liver disease -recent or ongoing radiation therapy -an unusual or allergic reaction to gemcitabine, other chemotherapy, other medicines, foods, dyes, or preservatives -pregnant or trying to get pregnant -breast-feeding How should I use this medicine? This drug is given as an infusion into a vein. It is  administered in a hospital or clinic by a specially trained health care professional. Talk to your pediatrician regarding the use of this medicine in children. Special care may be needed. Overdosage: If you think you have taken too much of this medicine contact a poison control center or emergency room at once. NOTE: This medicine is only for you. Do not share this medicine with others. What if I miss a dose? It is important not to miss your dose. Call your doctor or health care professional if you are unable to keep an appointment. What may interact with this medicine? -medicines to increase blood counts like filgrastim, pegfilgrastim, sargramostim -some other chemotherapy drugs like cisplatin -vaccines Talk to your doctor or health care professional before taking any of these medicines: -acetaminophen -aspirin -ibuprofen -ketoprofen -naproxen This list may not describe all possible interactions. Give your health care provider a list of all the medicines, herbs, non-prescription drugs, or dietary supplements you use. Also tell them if you smoke, drink alcohol, or use illegal drugs. Some items may interact with your medicine. What should I watch for while using this medicine? Visit your doctor for checks on your progress. This drug may make you feel generally unwell. This is not uncommon, as chemotherapy can affect healthy cells as well as cancer cells. Report any side effects. Continue your course of treatment even though you feel ill unless your doctor tells you to stop. In some cases, you may be given additional medicines to help with side effects. Follow all directions for their use. Call your doctor or health care professional for advice if you get a fever, chills or sore throat, or other symptoms of a  cold or flu. Do not treat yourself. This drug decreases your body's ability to fight infections. Try to avoid being around people who are sick. This medicine may increase your risk to bruise  or bleed. Call your doctor or health care professional if you notice any unusual bleeding. Be careful brushing and flossing your teeth or using a toothpick because you may get an infection or bleed more easily. If you have any dental work done, tell your dentist you are receiving this medicine. Avoid taking products that contain aspirin, acetaminophen, ibuprofen, naproxen, or ketoprofen unless instructed by your doctor. These medicines may hide a fever. Women should inform their doctor if they wish to become pregnant or think they might be pregnant. There is a potential for serious side effects to an unborn child. Talk to your health care professional or pharmacist for more information. Do not breast-feed an infant while taking this medicine. What side effects may I notice from receiving this medicine? Side effects that you should report to your doctor or health care professional as soon as possible: -allergic reactions like skin rash, itching or hives, swelling of the face, lips, or tongue -low blood counts - this medicine may decrease the number of white blood cells, red blood cells and platelets. You may be at increased risk for infections and bleeding. -signs of infection - fever or chills, cough, sore throat, pain or difficulty passing urine -signs of decreased platelets or bleeding - bruising, pinpoint red spots on the skin, black, tarry stools, blood in the urine -signs of decreased red blood cells - unusually weak or tired, fainting spells, lightheadedness -breathing problems -chest pain -mouth sores -nausea and vomiting -pain, swelling, redness at site where injected -pain, tingling, numbness in the hands or feet -stomach pain -swelling of ankles, feet, hands -unusual bleeding Side effects that usually do not require medical attention (report to your doctor or health care professional if they continue or are bothersome): -constipation -diarrhea -hair loss -loss of appetite -stomach  upset This list may not describe all possible side effects. Call your doctor for medical advice about side effects. You may report side effects to FDA at 1-800-FDA-1088. Where should I keep my medicine? This drug is given in a hospital or clinic and will not be stored at home. NOTE: This sheet is a summary. It may not cover all possible information. If you have questions about this medicine, talk to your doctor, pharmacist, or health care provider.  2018 [employer] Standard (2007-10-21 18:45:54)  Carboplatin injection What is this medicine? CARBOPLATIN (KAR boe pla tin) is a chemotherapy drug. It targets fast dividing cells, like cancer cells, and causes these cells to die. This medicine is used to treat ovarian cancer and many other cancers. This medicine may be used for other purposes; ask your health care provider or pharmacist if you have questions. COMMON BRAND NAME(S): Paraplatin What should I tell my health care provider before I take this medicine? They need to know if you have any of these conditions: -blood disorders -hearing problems -kidney disease -recent or ongoing radiation therapy -an unusual or allergic reaction to carboplatin, cisplatin, other chemotherapy, other medicines, foods, dyes, or preservatives -pregnant or trying to get pregnant -breast-feeding How should I use this medicine? This drug is usually given as an infusion into a vein. It is administered in a hospital or clinic by a specially trained health care professional. Talk to your pediatrician regarding the use of this medicine in children. Special care may be needed. Overdosage:  If you think you have taken too much of this medicine contact a poison control center or emergency room at once. NOTE: This medicine is only for you. Do not share this medicine with others. What if I miss a dose? It is important not to miss a dose. Call your doctor or health care professional if you are unable to keep an  appointment. What may interact with this medicine? -medicines for seizures -medicines to increase blood counts like filgrastim, pegfilgrastim, sargramostim -some antibiotics like amikacin, gentamicin, neomycin, streptomycin, tobramycin -vaccines Talk to your doctor or health care professional before taking any of these medicines: -acetaminophen -aspirin -ibuprofen -ketoprofen -naproxen This list may not describe all possible interactions. Give your health care provider a list of all the medicines, herbs, non-prescription drugs, or dietary supplements you use. Also tell them if you smoke, drink alcohol, or use illegal drugs. Some items may interact with your medicine. What should I watch for while using this medicine? Your condition will be monitored carefully while you are receiving this medicine. You will need important blood work done while you are taking this medicine. This drug may make you feel generally unwell. This is not uncommon, as chemotherapy can affect healthy cells as well as cancer cells. Report any side effects. Continue your course of treatment even though you feel ill unless your doctor tells you to stop. In some cases, you may be given additional medicines to help with side effects. Follow all directions for their use. Call your doctor or health care professional for advice if you get a fever, chills or sore throat, or other symptoms of a cold or flu. Do not treat yourself. This drug decreases your body's ability to fight infections. Try to avoid being around people who are sick. This medicine may increase your risk to bruise or bleed. Call your doctor or health care professional if you notice any unusual bleeding. Be careful brushing and flossing your teeth or using a toothpick because you may get an infection or bleed more easily. If you have any dental work done, tell your dentist you are receiving this medicine. Avoid taking products that contain aspirin, acetaminophen,  ibuprofen, naproxen, or ketoprofen unless instructed by your doctor. These medicines may hide a fever. Do not become pregnant while taking this medicine. Women should inform their doctor if they wish to become pregnant or think they might be pregnant. There is a potential for serious side effects to an unborn child. Talk to your health care professional or pharmacist for more information. Do not breast-feed an infant while taking this medicine. What side effects may I notice from receiving this medicine? Side effects that you should report to your doctor or health care professional as soon as possible: -allergic reactions like skin rash, itching or hives, swelling of the face, lips, or tongue -signs of infection - fever or chills, cough, sore throat, pain or difficulty passing urine -signs of decreased platelets or bleeding - bruising, pinpoint red spots on the skin, black, tarry stools, nosebleeds -signs of decreased red blood cells - unusually weak or tired, fainting spells, lightheadedness -breathing problems -changes in hearing -changes in vision -chest pain -high blood pressure -low blood counts - This drug may decrease the number of white blood cells, red blood cells and platelets. You may be at increased risk for infections and bleeding. -nausea and vomiting -pain, swelling, redness or irritation at the injection site -pain, tingling, numbness in the hands or feet -problems with balance, talking, walking -trouble passing  urine or change in the amount of urine Side effects that usually do not require medical attention (report to your doctor or health care professional if they continue or are bothersome): -hair loss -loss of appetite -metallic taste in the mouth or changes in taste This list may not describe all possible side effects. Call your doctor for medical advice about side effects. You may report side effects to FDA at 1-800-FDA-1088. Where should I keep my medicine? This drug  is given in a hospital or clinic and will not be stored at home. NOTE: This sheet is a summary. It may not cover all possible information. If you have questions about this medicine, talk to your doctor, pharmacist, or health care provider.  2018 [employer]/Gold Standard (2007-09-16 14:38:05)

## 2016-11-20 NOTE — Progress Notes (Signed)
[facility]  Telephone:([phone] [phone]     ID: Kayla Price DOB: 05-Jun-1956  MR#: [id number]  OJJ#:[id number]  Patient Care Team: Sharon Seller, NP as PCP - General (Nurse Practitioner) Daiva Eves, Lisette Grinder, MD as PCP - Infectious Diseases (Infectious Diseases) Ernesto Rutherford, MD as Consulting Physician (Ophthalmology) Romero Belling, MD as Consulting Physician (Endocrinology) Gean Birchwood, MD as Consulting Physician (Orthopedic Surgery) Cherene Dobbins, Valentino Hue, MD as Consulting Physician (Oncology) Claud Kelp, MD as Consulting Physician (General Surgery) Lowella Dell, MD OTHER MD:  CHIEF COMPLAINT: Triple negative breast cancer  CURRENT TREATMENT: Adjuvant chemotherapy   BREAST CANCER HISTORY: From the original intake note:  Kayla Price is a history of right-sided breast cancer dating back to 2005. She had a lumpectomy with sentinel lymph node sampling, chemotherapy, and radiation. I do not have access to those records at present.  More recently she had bilateral [facility]2016 showing a possible mass in the right breast. Diagnostic mammography with ultrasonography on 09/28/2016 the patient underwent right diagnostic mammography with tomography and right breast ultrasonography. The breast density was category A. In the right breast at the 10:00 position there was an irregular mass measuring 2.5 cm. Ultrasound identified this the 10:00 radiant 10 cm from the nipple measuring 2.4 cm. In the right axilla there was an abnormal lymph node measuring 1.3 cm with other normal-appearing lymph nodes.  On 10/01/2016 she  underwent biopsy of the right breast mass in question as well as the suspicious axillary lymph node. 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 The  proliferation marker was 70% in the breast lesion and 50% in the lymph node.  Her subsequent history is as detailed below.   INTERVAL HISTORY: Kayla Price returns today for follow-up of her triple negative breast cancer. Today is day 1 cycle 1 of 6 planned cycles of carboplatin and gemcitabine given on days 1 and 8 of each 21 day cycle.  Since her last visit here she had a CT scan of the chest abdomen and pelvis, showing no evidence of metastatic disease. There was some thickening of the uterus which will require further evaluation.  REVIEW OF SYSTEMS: Kayla Price just had 1 of 2 drains removed and is expecting to have the second drain removed later this week. This allows her to drive, and she is "feeling good". She is doing a little bit of housework. She has joint pain and arthritis pain which is not new from baseline. A detailed review of systems today was otherwise stable.     PAST MEDICAL HISTORY: Past Medical History:  Diagnosis Date  . Alopecia areata 11/28/2009  . Bell's palsy   . BREAST CANCER, HX OF 2005  . Cancer [facility]) 2005   Breast cancer   chemotherapy and radiation  . CKD (chronic kidney disease) stage 3, GFR 30-59 ml/min 06/08/2015  . Dry eye syndrome   . Fasting hyperglycemia   . HIP FRACTURE, RIGHT 05/06/2008  . HIV (human immunodeficiency virus infection) (HCC)   . HIV DISEASE 03/27/2006  . HYPERLIPIDEMIA, MIXED 12/15/2007  . HYPERTENSION 03/27/2006  . 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 MENORRHAGIA, POSTMENOPAUSAL 02/03/2009  . Osteoarthritis of left knee 06/08/2015  . OSTEOARTHROSIS, LOCAL, SCND, UNSPC SITE 04/07/2007  . PVD 04/07/2007  . Tinea capitis   . TRIGGER FINGER 05/06/2008  . Unspecified vitamin D deficiency 08/06/2007  PAST SURGICAL HISTORY: Past Surgical History:  Procedure Laterality Date  . BREAST LUMPECTOMY Right    2005  . COLONOSCOPY    . HYSTEROSCOPY  2006  . KNEE ARTHROSCOPY Left 2002  . LYMPH NODE DISSECTION  2005  . MASTECTOMY  MODIFIED RADICAL Right 11/05/2016   Procedure: RIGHT MASTECTOMY MODIFIED RADICAL;  Surgeon: Claud Kelp, MD;  Location: [facility] OR;  Service: General;  Laterality: Right;  . MODIFIED RADICAL MASTECTOMY Right 11/05/2016  . placement   of port-a-cath    . PORT-A-CATH REMOVAL  2006   insertion 2005  . PORTACATH PLACEMENT Right 11/05/2016   Procedure: INSERTION PORT-A-CATH WITH ULTRA SOUND;  Surgeon: Claud Kelp, MD;  Location: [facility] OR;  Service: General;  Laterality: Right;  . removal of port a cath  12/2014  . THYROID SURGERY  2009   Ablation   . TOTAL KNEE ARTHROPLASTY Left 02/06/2016   Procedure: TOTAL KNEE ARTHROPLASTY;  Surgeon: Gean Birchwood, MD;  Location: [facility] OR;  Service: Orthopedics;  Laterality: Left;  . TUBAL LIGATION      FAMILY HISTORY Family History  Problem Relation Age of Onset  . Heart attack Brother        Massive MI in 52s  . Stroke Brother        CAD  . Kidney disease Mother   . Stroke Mother   . Diabetes Mother   . Liver disease Sister   . COPD Sister        had I-131 rx of hyperthyroidism  . Diabetes Sister   . Stroke Sister   . Cancer Paternal Uncle   . Cancer Cousin   . Heart failure Father   . Heart disease Father   . Arthritis Father   . Sarcoidosis Sister   The patient's father died at the age of 71 in the setting of Alzheimer's disease. The patient's mother died at the age of 66 from complications of diabetes. The patient has 3 brothers, 4 sisters. There is no history of breast or ovarian cancer in the family area   GYNECOLOGIC HISTORY:  Patient's last menstrual period was 11/06/2003.  menarche age 89, first live birth age 29, the patient is GX P2. She stopped having periods in 2005, with her chemotherapy; she never took hormone replacement   SOCIAL HISTORY:  Murdered work for the [employer] more than 20 years. 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 At home she lives with her son [name], her daughter [name] who works for [employer] and her granddaughter [name], 54 y/o AS OF APRIL 2018     ADVANCED DIRECTIVES:  not in place    HEALTH MAINTENANCE: Social History  Substance Use Topics  . Smoking status: Never Smoker  . Smokeless tobacco: Never Used  . Alcohol use No     Colonoscopy:  PAP:  Bone density:   Allergies  Allergen Reactions  . Lisinopril Anaphylaxis and Swelling    Swelling of tongue and mouth 11/05/16- tolerates Olmesartan    Current Outpatient Prescriptions  Medication Sig Dispense Refill  . Cholecalciferol (VITAMIN D3) 2000 units TABS Take 2,000 Units by mouth daily with lunch.    . co-enzyme Q-10 30 MG capsule Take 1 tablet by mouth daily.    . darunavir-cobicistat (PREZCOBIX) 800-150 MG tablet TAKE 1 TABLET BY MOUTH DAILY. SWALLOW WHOLE. DO NOT CRUSH, BREAK OR CHEW TABLETS. TAKE WITH FOOD. (Patient taking differently: Take 1 tablet by mouth every evening. TAKE 1  TABLET BY MOUTH DAILY. SWALLOW WHOLE. DO NOT CRUSH, BREAK OR CHEW TABLETS. TAKE WITH FOOD.) 30 tablet 11  . dexamethasone (DECADRON) 4 MG tablet Take 2 tablets (8 mg total) by mouth daily. Start the day after chemotherapy for 2 days. Take with food. 30 tablet 1  . dolutegravir (TIVICAY) 50 MG tablet Take 1 tablet (50 mg total) by mouth daily. (Patient taking differently: Take 50 mg by mouth every evening. ) 30 tablet 11  . gabapentin (NEURONTIN) 300 MG capsule Take 1 capsule (300 mg total) by mouth 4 (four) times daily. 120 capsule 4  . HYDROcodone-acetaminophen (NORCO/VICODIN) 5-325 MG tablet Take 1 tablet by mouth every 6 (six) hours as needed for moderate pain. 120 tablet 0  . levothyroxine (SYNTHROID, LEVOTHROID) 175 MCG tablet Take 1 tablet (175 mcg total) by mouth daily before breakfast. 90 tablet 1  . lidocaine-prilocaine (EMLA) cream Apply to affected area once 30 g 3  . loratadine (CLARITIN) 10 MG tablet Take 10 mg by mouth daily as needed for itching. Reported on 06/08/2015    .  LORazepam (ATIVAN) 0.5 MG tablet Take 1 tablet (0.5 mg total) by mouth at bedtime as needed (Nausea or vomiting). 30 tablet 0  . methocarbamol (ROBAXIN) 500 MG tablet Take 500 mg by mouth every 8 (eight) hours as needed for muscle spasms.    Marland Kitchen olmesartan-hydrochlorothiazide (BENICAR HCT) 40-25 MG tablet TAKE 1 TABLET EVERY DAY (Patient taking differently: TAKE 1 TABLET EVERY DAY WITH LUNCH) 90 tablet 1  . Omega-3 Fatty Acids (FISH OIL PO) Take 1 capsule by mouth daily with lunch.    . Pitavastatin Calcium 4 MG TABS Take 1 tablet (4 mg total) by mouth daily. This may be placebo, study provided. Do not dispense. (Patient taking differently: Take 4 mg by mouth daily with lunch. This may be placebo, study provided. Do not dispense.) 30 tablet 11  . Probiotic Product (PROBIOTIC PO) Take 1 capsule by mouth daily with lunch.    . prochlorperazine (COMPAZINE) 10 MG tablet Take 1 tablet (10 mg total) by mouth every 6 (six) hours as needed (Nausea or vomiting). 30 tablet 1  . rilpivirine (EDURANT) 25 MG TABS tablet TAKE 1 TABLET BY MOUTH EVERY DAY WITH BREAKFAST (Patient taking differently: Take 25 mg by mouth every evening. ) 30 tablet 11   No current facility-administered medications for this visit.     OBJECTIVE: Middle-aged African-American womanWho appears stated age  Vitals:   11/20/16 1201  BP: (!) 141/86  Pulse: 71  Resp: 18  Temp: 97.5 F (36.4 C)     Body mass index is 40.97 kg/m.    ECOG FS:1 - Symptomatic but completely ambulatory  Sclerae unicteric, pupils round and equal Oropharynx clear and moist No cervical or supraclavicular adenopathy Lungs no rales or rhonchi Heart regular rate and rhythm Abd soft, obese, nontender, positive bowel sounds MSK no focal spinal tendernessa Neuro: nonfocal, well oriented, appropriate affect Breasts: Deferred   LAB RESULTS:  CMP     Component Value Date/Time   NA 143 11/12/2016 0900   NA 144 11/09/2016 1106   K 4.0 11/12/2016 0900   K  5.1 11/09/2016 1106   CL 103 11/12/2016 0900   CO2 30 11/12/2016 0900   CO2 30 (H) 11/09/2016 1106   GLUCOSE 87 11/12/2016 0900   GLUCOSE 85 11/09/2016 1106   BUN 20 11/12/2016 0900   BUN 23.9 11/09/2016 1106   CREATININE 1.41 (H) 11/12/2016 0900   CREATININE 1.4 (H) 11/09/2016 1106  CALCIUM 10.4 11/12/2016 0900   CALCIUM 11.2 (H) 11/09/2016 1106   PROT 7.3 11/12/2016 0900   PROT 7.7 11/09/2016 1106   ALBUMIN 3.8 11/12/2016 0900   ALBUMIN 3.6 11/09/2016 1106   AST 12 11/12/2016 0900   AST 14 11/09/2016 1106   ALT 11 11/12/2016 0900   ALT 16 11/09/2016 1106   ALKPHOS 79 11/12/2016 0900   ALKPHOS 69 11/09/2016 1106   BILITOT 0.7 11/12/2016 0900   BILITOT 0.66 11/09/2016 1106   GFRNONAA 43 (L) 11/06/2016 0333   GFRNONAA 42 (L) 09/18/2016 0834   GFRAA 50 (L) 11/06/2016 0333   GFRAA 48 (L) 09/18/2016 0834    No results found for: TOTALPROTELP, ALBUMINELP, A1GS, A2GS, BETS, BETA2SER, GAMS, MSPIKE, SPEI  No results found for: Ron Parker, [place] [place]  Lab Results  Component Value Date   WBC 4.8 11/20/2016   NEUTROABS 1.9 11/20/2016   HGB 14.3 11/20/2016   HCT 42.3 11/20/2016   MCV 95.4 11/20/2016   PLT 186 11/20/2016      Chemistry      Component Value Date/Time   NA 143 11/12/2016 0900   NA 144 11/09/2016 1106   K 4.0 11/12/2016 0900   K 5.1 11/09/2016 1106   CL 103 11/12/2016 0900   CO2 30 11/12/2016 0900   CO2 30 (H) 11/09/2016 1106   BUN 20 11/12/2016 0900   BUN 23.9 11/09/2016 1106   CREATININE 1.41 (H) 11/12/2016 0900   CREATININE 1.4 (H) 11/09/2016 1106   GLU 104 02/13/2016 1358      Component Value Date/Time   CALCIUM 10.4 11/12/2016 0900   CALCIUM 11.2 (H) 11/09/2016 1106   ALKPHOS 79 11/12/2016 0900   ALKPHOS 69 11/09/2016 1106   AST 12 11/12/2016 0900   AST 14 11/09/2016 1106   ALT 11 11/12/2016 0900   ALT 16 11/09/2016 1106   BILITOT 0.7 11/12/2016 0900   BILITOT 0.66 11/09/2016 1106       Lab Results  Component Value Date    LABCA2 11 04/20/2008    No components found for: [id number]  No results for input(s): INR in the last 168 hours.  Urinalysis    Component Value Date/Time   COLORURINE YELLOW 01/27/2016 1028   APPEARANCEUR CLEAR 01/27/2016 1028   LABSPEC 1.028 01/27/2016 1028   PHURINE 5.5 01/27/2016 1028   GLUCOSEU NEGATIVE 01/27/2016 1028   GLUCOSEU NEG mg/dL 62/13/0865 7846   HGBUR NEGATIVE 01/27/2016 1028   BILIRUBINUR NEGATIVE 01/27/2016 1028   KETONESUR NEGATIVE 01/27/2016 1028   PROTEINUR NEGATIVE 01/27/2016 1028   UROBILINOGEN 1 07/23/2006 2113   NITRITE NEGATIVE 01/27/2016 1028   LEUKOCYTESUR NEGATIVE 01/27/2016 1028     STUDIES: Ct Chest W Contrast  Result Date: 11/16/2016 CLINICAL DATA:  Recurrent right breast cancer. Mastectomy with nodal removal in May 2018 (drain still in place). EXAM: CT CHEST, ABDOMEN, AND PELVIS WITH CONTRAST TECHNIQUE: Multidetector CT imaging of the chest, abdomen and pelvis was performed following the standard protocol during bolus administration of intravenous contrast. CONTRAST:  ISOVUE-300 IOPAMIDOL (ISOVUE-300) INJECTION 61% COMPARISON:  Chest CT 04/28/2007.  Pelvic sonography 10/25/2009. FINDINGS: CT CHEST FINDINGS Cardiovascular: Right Port-A-Cath tip:  SVC. Mediastinum/Nodes: Right mastectomy and axillary dissection with postinflammatory findings and a right axillary drain still in place. Mild indistinctness of superficial fascia margins along the pectoralis musculature. On the left side there is a 0.9 cm subpectoral lymph node on image 15/2. Several small left axillary lymph nodes are present. No pathologic mediastinal or hilar adenopathy. Lungs/Pleura: Low-level  stranding anteriorly in the right chest may be from prior radiation therapy. A 4 mm nodule posteriorly in the right upper lobe on image 62/4 is not readily apparent on the prior CT chest from 04/28/2007. Musculoskeletal: Thoracic spondylosis. CT ABDOMEN PELVIS FINDINGS Hepatobiliary: Dependent  density in the gallbladder favoring small gallstones, images 68-69 series 2. Punctate likely postinflammatory calcification posteriorly along the right hepatic lobe capsule, image 50/2. Pancreas: Unremarkable Spleen: Unremarkable Adrenals/Urinary Tract: Unremarkable Stomach/Bowel: Scattered descending and sigmoid colon diverticula without active diverticulitis identified. Vascular/Lymphatic: 1.2 cm left external iliac lymph node on image 107/2. Reproductive: Mild nodularity along the left posterior uterine body probably from a small fibroid. The endometrium is estimated at up to 11 mm in thickness. Other: No supplemental non-categorized findings. Musculoskeletal: Lumbar spondylosis and degenerative disc disease with grade 1 degenerative anterolisthesis at L4-5 at L5-S1, and mild foraminal impingement at both of these levels. IMPRESSION: 1. Several findings warrant surveillance including a 9 mm left subpectoral lymph node and a 1.2 cm left external iliac lymph node, as well as a 4 mm nodule in the right upper lobe. Overall these findings are probably benign but warrants surveillance in this setting. Otherwise, no obvious/specific signs of active malignancy. 2. Recent right mastectomy. 3. Cholelithiasis. 4. Mild diverticulosis without active diverticulitis. 5. The endometrium is thickened at up to 11 mm. On prior sonographic workup in 2011, the patient had an even more thickened endometrium in the setting of postmenopausal bleeding, correlation without workup is recommended. The current endometrial thickness of 11 mm is considered abnormal, and pelvic sonography should be strongly considered. 6. Mild lower lumbar impingement related to spondylosis and degenerative disc disease. Electronically Signed   By: Gaylyn Rong M.D.   On: 11/16/2016 10:23   Ct Abdomen Pelvis W Contrast  Result Date: 11/16/2016 CLINICAL DATA:  Recurrent right breast cancer. Mastectomy with nodal removal in May 2018 (drain still in  place). EXAM: CT CHEST, ABDOMEN, AND PELVIS WITH CONTRAST TECHNIQUE: Multidetector CT imaging of the chest, abdomen and pelvis was performed following the standard protocol during bolus administration of intravenous contrast. CONTRAST:  ISOVUE-300 IOPAMIDOL (ISOVUE-300) INJECTION 61% COMPARISON:  Chest CT 04/28/2007.  Pelvic sonography 10/25/2009. FINDINGS: CT CHEST FINDINGS Cardiovascular: Right Port-A-Cath tip:  SVC. Mediastinum/Nodes: Right mastectomy and axillary dissection with postinflammatory findings and a right axillary drain still in place. Mild indistinctness of superficial fascia margins along the pectoralis musculature. On the left side there is a 0.9 cm subpectoral lymph node on image 15/2. Several small left axillary lymph nodes are present. No pathologic mediastinal or hilar adenopathy. Lungs/Pleura: Low-level stranding anteriorly in the right chest may be from prior radiation therapy. A 4 mm nodule posteriorly in the right upper lobe on image 62/4 is not readily apparent on the prior CT chest from 04/28/2007. Musculoskeletal: Thoracic spondylosis. CT ABDOMEN PELVIS FINDINGS Hepatobiliary: Dependent density in the gallbladder favoring small gallstones, images 68-69 series 2. Punctate likely postinflammatory calcification posteriorly along the right hepatic lobe capsule, image 50/2. Pancreas: Unremarkable Spleen: Unremarkable Adrenals/Urinary Tract: Unremarkable Stomach/Bowel: Scattered descending and sigmoid colon diverticula without active diverticulitis identified. Vascular/Lymphatic: 1.2 cm left external iliac lymph node on image 107/2. Reproductive: Mild nodularity along the left posterior uterine body probably from a small fibroid. The endometrium is estimated at up to 11 mm in thickness. Other: No supplemental non-categorized findings. Musculoskeletal: Lumbar spondylosis and degenerative disc disease with grade 1 degenerative anterolisthesis at L4-5 at L5-S1, and mild foraminal  impingement at both of these levels. IMPRESSION: 1. Several  findings warrant surveillance including a 9 mm left subpectoral lymph node and a 1.2 cm left external iliac lymph node, as well as a 4 mm nodule in the right upper lobe. Overall these findings are probably benign but warrants surveillance in this setting. Otherwise, no obvious/specific signs of active malignancy. 2. Recent right mastectomy. 3. Cholelithiasis. 4. Mild diverticulosis without active diverticulitis. 5. The endometrium is thickened at up to 11 mm. On prior sonographic workup in 2011, the patient had an even more thickened endometrium in the setting of postmenopausal bleeding, correlation without workup is recommended. The current endometrial thickness of 11 mm is considered abnormal, and pelvic sonography should be strongly considered. 6. Mild lower lumbar impingement related to spondylosis and degenerative disc disease. Electronically Signed   By: Gaylyn Rong M.D.   On: 11/16/2016 10:23   Dg Chest Port 1 View  Result Date: 11/05/2016 CLINICAL DATA:  Status post Port-A-Cath insertion EXAM: PORTABLE CHEST 1 VIEW COMPARISON:  None FINDINGS: There is a right subclavian port a catheter with tip at the projection of the distal SVC. The heart size appears normal. No pleural effusion or edema. No airspace opacities. A surgical drain overlies the right chest wall. IMPRESSION: 1. Right subclavian port a catheter is identified with tip in the projection of the distal SVC. Electronically Signed   By: Signa Kell M.D.   On: 11/05/2016 11:06   Dg Fluoro Guide Cv Line-no Report  Result Date: 11/05/2016 Fluoroscopy was utilized by the requesting physician.  No radiographic interpretation.    ELIGIBLE FOR AVAILABLE RESEARCH PROTOCOL: no  ASSESSMENT: 61 y.o. [place] woman   (1) status post right lumpectomy and sentinel lymph node sampling October 2005 for a 0.6 cm invasive ductal carcinoma involving one out of 2 sentinel lymph nodes  sampled, grade 3, triple-negative, treated adjuvantly with doxorubicin and cyclophosphamide 4 followed by weekly paclitaxel 7, followed by adjuvant radiation  (2) status post right breast upper outer quadrant biopsy and right axillary lymph node biopsy 10/01/2016, both positive for a T2 N1, stage IIIB invasive ductal carcinoma, triple negative, with an MIB-1 of 50-70%  (3) status post right modified radical mastectomy 11/05/2016 showing a pT2 pN1, stage IIIB invasive ductal carcinoma, grade 3, triple negative, with negative margins  (4) Not a candidate for radiation given prior history  (5) adjuvant chemotherapy will consist of carboplatin and gemcitabine given days 1 and 8 of each 21 day cycle, for 6 cycles  (5) HIV positivity  PLAN: Kayla Price still has a drain in place but I think it will be safe to go ahead and start her chemotherapy now. Hopefully the drain can be removed later this week assuming her drainage continues to be as low as it is now.  We reviewed her nausea medicine use, M.D. other side effects she may expect from this treatment. We also reviewed his results of her CT scan which were generally favorable.  Her endometrial thickness remains a concern. This is been evaluated in the past with hysteroscopy and biopsies, which had been benign. Nevertheless were going to proceed to a transvaginal ultrasound sometime in the next week or 2.  She will see me again in a week. We will troubleshoot side effects of her chemotherapy at that time and set her up for her remaining treatment  She knows to call for any other issues that may develop before that visit.  Lowella Dell, MD   11/20/2016 12:14 PM Medical Oncology and Hematology [facility] [address] [place]  [place], [place] [address] Tel. [phone]    Fax. [phone]

## 2016-11-21 DIAGNOSIS — H524 Presbyopia: Secondary | ICD-10-CM | POA: Diagnosis not present

## 2016-11-21 LAB — HIV-1 RNA QUANT-NO REFLEX-BLD
HIV 1 RNA Quant: <20 copies/mL
HIV-1 RNA Quant, Log: <1.3 Log copies/mL

## 2016-11-21 MED FILL — EDURANT 25 MG TABS: 25 | 30 days supply | Qty: 30 | Fill #3

## 2016-11-21 MED FILL — TIVICAY 50 MG TABLET: 50 | 30 days supply | Qty: 30 | Fill #3

## 2016-11-21 MED FILL — PREZCOBIX 800 MG-150 MG TAB: 800-150 | 30 days supply | Qty: 30 | Fill #3

## 2016-11-23 NOTE — Addendum Note (Signed)
Addendum  created 11/23/16 1456 by Rica Koyanagi, MD   Sign clinical note

## 2016-11-26 ENCOUNTER — Ambulatory Visit (HOSPITAL_BASED_OUTPATIENT_CLINIC_OR_DEPARTMENT_OTHER): Payer: Medicare HMO | Admitting: Oncology

## 2016-11-26 ENCOUNTER — Other Ambulatory Visit (HOSPITAL_BASED_OUTPATIENT_CLINIC_OR_DEPARTMENT_OTHER): Payer: Medicare HMO

## 2016-11-26 ENCOUNTER — Ambulatory Visit (HOSPITAL_BASED_OUTPATIENT_CLINIC_OR_DEPARTMENT_OTHER): Payer: Medicare HMO

## 2016-11-26 ENCOUNTER — Telehealth: Payer: Self-pay | Admitting: Radiology

## 2016-11-26 ENCOUNTER — Ambulatory Visit: Payer: Commercial Managed Care - HMO | Admitting: Infectious Disease

## 2016-11-26 ENCOUNTER — Encounter: Payer: Self-pay | Admitting: Infectious Disease

## 2016-11-26 ENCOUNTER — Ambulatory Visit (INDEPENDENT_AMBULATORY_CARE_PROVIDER_SITE_OTHER): Payer: Medicare HMO | Admitting: Infectious Disease

## 2016-11-26 VITALS — Resp 18

## 2016-11-26 VITALS — BP 108/73 | HR 75 | Temp 98.5°F | Ht 67.0 in | Wt 260.4 lb

## 2016-11-26 DIAGNOSIS — R9389 Abnormal findings on diagnostic imaging of other specified body structures: Secondary | ICD-10-CM

## 2016-11-26 DIAGNOSIS — Z171 Estrogen receptor negative status [ER-]: Principal | ICD-10-CM

## 2016-11-26 DIAGNOSIS — B20 Human immunodeficiency virus [HIV] disease: Secondary | ICD-10-CM | POA: Diagnosis not present

## 2016-11-26 DIAGNOSIS — C773 Secondary and unspecified malignant neoplasm of axilla and upper limb lymph nodes: Secondary | ICD-10-CM

## 2016-11-26 DIAGNOSIS — C50911 Malignant neoplasm of unspecified site of right female breast: Secondary | ICD-10-CM

## 2016-11-26 DIAGNOSIS — C50411 Malignant neoplasm of upper-outer quadrant of right female breast: Secondary | ICD-10-CM

## 2016-11-26 DIAGNOSIS — Z5111 Encounter for antineoplastic chemotherapy: Secondary | ICD-10-CM | POA: Diagnosis not present

## 2016-11-26 DIAGNOSIS — N183 Chronic kidney disease, stage 3 unspecified: Secondary | ICD-10-CM

## 2016-11-26 DIAGNOSIS — R938 Abnormal findings on diagnostic imaging of other specified body structures: Secondary | ICD-10-CM | POA: Diagnosis not present

## 2016-11-26 LAB — COMPREHENSIVE METABOLIC PANEL
ALT: 19 U/L (ref 0–55)
ANION GAP: 6 meq/L (ref 3–11)
AST: 16 U/L (ref 5–34)
Albumin: 3.4 g/dL — ABNORMAL LOW (ref 3.5–5.0)
Alkaline Phosphatase: 64 U/L (ref 40–150)
BILIRUBIN TOTAL: 0.95 mg/dL (ref 0.20–1.20)
BUN: 27 mg/dL — ABNORMAL HIGH (ref 7.0–26.0)
CHLORIDE: 102 meq/L (ref 98–109)
CO2: 33 mEq/L — ABNORMAL HIGH (ref 22–29)
CREATININE: 1.4 mg/dL — AB (ref 0.6–1.1)
Calcium: 10.9 mg/dL — ABNORMAL HIGH (ref 8.4–10.4)
EGFR: 45 mL/min/{1.73_m2} — ABNORMAL LOW (ref >90)
Glucose: 89 mg/dl (ref 70–140)
Potassium: 3.9 mEq/L (ref 3.5–5.1)
Sodium: 141 mEq/L (ref 136–145)
TOTAL PROTEIN: 6.9 g/dL (ref 6.4–8.3)

## 2016-11-26 LAB — CBC WITH DIFFERENTIAL/PLATELET
BASO%: 0 % (ref 0.0–2.0)
Basophils Absolute: 0 10*3/uL (ref 0.0–0.1)
EOS%: 3.3 % (ref 0.0–7.0)
Eosinophils Absolute: 0.2 10*3/uL (ref 0.0–0.5)
HCT: 39.6 % (ref 34.8–46.6)
HGB: 13.2 g/dL (ref 11.6–15.9)
LYMPH%: 60.3 % — AB (ref 14.0–49.7)
MCH: 32.2 pg (ref 25.1–34.0)
MCHC: 33.3 g/dL (ref 31.5–36.0)
MCV: 96.6 fL (ref 79.5–101.0)
MONO#: 0 10*3/uL — AB (ref 0.1–0.9)
MONO%: 0.8 % (ref 0.0–14.0)
NEUT%: 35.6 % — AB (ref 38.4–76.8)
NEUTROS ABS: 1.7 10*3/uL (ref 1.5–6.5)
Platelets: 165 10*3/uL (ref 145–400)
RBC: 4.1 10*6/uL (ref 3.70–5.45)
RDW: 13.3 % (ref 11.2–14.5)
WBC: 4.9 10*3/uL (ref 3.9–10.3)
lymph#: 3 10*3/uL (ref 0.9–3.3)

## 2016-11-26 MED ORDER — SODIUM CHLORIDE 0.9 % IV SOLN
2400.0000 mg | Freq: Once | INTRAVENOUS | Status: AC
  Administered 2016-11-26: 2400 mg via INTRAVENOUS
  Filled 2016-11-26: qty 63.12

## 2016-11-26 MED ORDER — PALONOSETRON HCL INJECTION 0.25 MG/5ML
0.2500 mg | Freq: Once | INTRAVENOUS | Status: AC
  Administered 2016-11-26: 0.25 mg via INTRAVENOUS

## 2016-11-26 MED ORDER — PALONOSETRON HCL INJECTION 0.25 MG/5ML
INTRAVENOUS | Status: AC
  Filled 2016-11-26: qty 5

## 2016-11-26 MED ORDER — SODIUM CHLORIDE 0.9% FLUSH
10.0000 mL | INTRAVENOUS | Status: DC | PRN
  Administered 2016-11-26: 10 mL
  Filled 2016-11-26: qty 10

## 2016-11-26 MED ORDER — SODIUM CHLORIDE 0.9 % IV SOLN
Freq: Once | INTRAVENOUS | Status: AC
  Administered 2016-11-26: 15:00:00 via INTRAVENOUS

## 2016-11-26 MED ORDER — DOLUTEGRAVIR-RILPIVIRINE 50-25 MG PO TABS: 1.0000 | ORAL_TABLET | Freq: Every day | ORAL | 9 refills | Status: DC

## 2016-11-26 MED ORDER — DEXAMETHASONE SODIUM PHOSPHATE 10 MG/ML IJ SOLN
INTRAMUSCULAR | Status: AC
  Filled 2016-11-26: qty 1

## 2016-11-26 MED ORDER — HEPARIN SOD (PORK) LOCK FLUSH 100 UNIT/ML IV SOLN
500.0000 [IU] | Freq: Once | INTRAVENOUS | Status: AC | PRN
  Administered 2016-11-26: 500 [IU]
  Filled 2016-11-26: qty 5

## 2016-11-26 MED ORDER — DEXAMETHASONE SODIUM PHOSPHATE 10 MG/ML IJ SOLN
10.0000 mg | Freq: Once | INTRAMUSCULAR | Status: AC
  Administered 2016-11-26: 10 mg via INTRAVENOUS

## 2016-11-26 MED ORDER — SODIUM CHLORIDE 0.9 % IV SOLN
211.2000 mg | Freq: Once | INTRAVENOUS | Status: AC
  Administered 2016-11-26: 210 mg via INTRAVENOUS
  Filled 2016-11-26: qty 21

## 2016-11-26 NOTE — Patient Instructions (Signed)
[place] Discharge Instructions for Patients Receiving Chemotherapy  Today you received the following chemotherapy agents Gemzar and Carboplatin   To help prevent nausea and vomiting after your treatment, we encourage you to take your nausea medication as directed.    If you develop nausea and vomiting that is not controlled by your nausea medication, call the clinic.   BELOW ARE SYMPTOMS THAT SHOULD BE REPORTED IMMEDIATELY:  *FEVER GREATER THAN 100.5 F  *CHILLS WITH OR WITHOUT FEVER  NAUSEA AND VOMITING THAT IS NOT CONTROLLED WITH YOUR NAUSEA MEDICATION  *UNUSUAL SHORTNESS OF BREATH  *UNUSUAL BRUISING OR BLEEDING  TENDERNESS IN MOUTH AND THROAT WITH OR WITHOUT PRESENCE OF ULCERS  *URINARY PROBLEMS  *BOWEL PROBLEMS  UNUSUAL RASH Items with * indicate a potential emergency and should be followed up as soon as possible.  Feel free to call the clinic you have any questions or concerns. The clinic phone number is [phone].  Please show the [facility] at check-in to the Emergency Department and triage nurse.

## 2016-11-26 NOTE — Progress Notes (Signed)
Chief complaint: followup for HIV, recently diagnosed with breast cancer Subjective:    Patient ID: Kayla Price, female    DOB: 1956-02-23, 61 y.o.   MRN: [id number]  HPI  Kayla Price is a 61 y.o. female who is doing superbly well on her  antiviral regimen, of Tivicay, Prezcobix and Edurant with undetectable viral load and health cd4 count.   She has been diagnosed with with recurrent  breast cancer  She had prior history of right-sided breast cancer dating back to 2005. She had a lumpectomy with sentinel lymph node sampling, chemotherapy, and radiation    She is post right breast upper outer quadrant biopsy and right axillary lymph node biopsy 10/01/2016, both positive for a T2 N1, stage IIIB invasive ductal carcinoma, triple negative, with an MIB-1 of 50-70%  She is now status post right modified radical mastectomy 11/05/2016 showing a pT2 pN1, stage IIIB invasive ductal carcinoma, grade 3, triple negative, with negative margins with LN sampling, portacath insertion.   She is now sp day 1 of 6 cycles of carboplatin and gemcitabine   With re to her ARV hx [name] had reviewed prior gentoypes below:   At prior [facility] and I reviewed her  HIV and pulled in ALL of the R mutations noted in Dr Ruffin Frederick notes and we had neglected to mention a 41L which when added to all of her other nutation is essentially takes out all the and NRTI's and led to change from prior regimen of Isentress, Atripla to current one.  HIVdb: Genotypic Resistance Interpretation Algorithm  Date: 27-Oct-2014 17:15:11 UTC   Drug Resistance Interpretation: PR PI Major Resistance Mutations: None PI Minor Resistance Mutations: None Other Mutations: V77I Protease Inhibitors atazanavir/r (ATV/r) Susceptible darunavir/r (DRV/r) Susceptible fosamprenavir/r (FPV/r) Susceptible indinavir/r (IDV/r) Susceptible lopinavir/r (LPV/r) Susceptible nelfinavir (NFV) Susceptible saquinavir/r  (SQV/r) Susceptible tipranavir/r (TPV/r) Susceptible PR Comments  Drug Resistance Interpretation: RT NRTI Resistance Mutations: M41L, D67N, M184V, L210W, T215Y NNRTI Resistance Mutations: None Other Mutations: E44D, V118I Nucleoside RTI lamivudine (3TC) High-level resistance abacavir (ABC) High-level resistance zidovudine (AZT) High-level resistance stavudine (D4T) High-level resistance didanosine (DDI) High-level resistance emtricitabine (FTC) High-level resistance tenofovir (TDF) High-level resistance Non-Nucleoside RTI efavirenz (EFV) Susceptible etravirine (ETR) Susceptible nevirapine (NVP) Susceptible rilpivirine (RPV) Susceptible     Lab Results  Component Value Date   HIV1RNAQUANT <20 NOT DETECTED 11/15/2016   HIV1RNAQUANT <20 05/28/2016   HIV1RNAQUANT 128 (H) 05/14/2016     Lab Results  Component Value Date   CD4TABS 760 11/15/2016   CD4TABS 1,010 05/14/2016   CD4TABS 810 11/10/2015       Past Medical History:  Diagnosis Date  . Alopecia areata 11/28/2009  . Bell's palsy   . BREAST CANCER, HX OF 2005  . Cancer [facility]) 2005   Breast cancer   chemotherapy and radiation  . CKD (chronic kidney disease) stage 3, GFR 30-59 ml/min 06/08/2015  . Dry eye syndrome   . Fasting hyperglycemia   . HIP FRACTURE, RIGHT 05/06/2008  . HIV (human immunodeficiency virus infection) ([place])   . HIV DISEASE 03/27/2006  . HYPERLIPIDEMIA, MIXED 12/15/2007  . HYPERTENSION 03/27/2006  . 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 MENORRHAGIA, POSTMENOPAUSAL 02/03/2009  . Osteoarthritis of left knee 06/08/2015  . OSTEOARTHROSIS, LOCAL, SCND, UNSPC SITE 04/07/2007  . PVD 04/07/2007  . Tinea capitis   . TRIGGER FINGER 05/06/2008  . Unspecified vitamin D deficiency 08/06/2007    Past Surgical History:  Procedure Laterality Date  . BREAST LUMPECTOMY Right    2005  .  COLONOSCOPY    . HYSTEROSCOPY  2006  . KNEE ARTHROSCOPY Left 2002  . LYMPH NODE DISSECTION  2005  . MASTECTOMY  MODIFIED RADICAL Right 11/05/2016   Procedure: RIGHT MASTECTOMY MODIFIED RADICAL;  Surgeon: Fanny Skates, MD;  Location: [place];  Service: General;  Laterality: Right;  . MODIFIED RADICAL MASTECTOMY Right 11/05/2016  . placement   of port-a-cath    . PORT-A-CATH REMOVAL  2006   insertion 2005  . PORTACATH PLACEMENT Right 11/05/2016   Procedure: INSERTION PORT-A-CATH WITH ULTRA SOUND;  Surgeon: Fanny Skates, MD;  Location: [place];  Service: General;  Laterality: Right;  . removal of port a cath  12/2014  . THYROID SURGERY  2009   Ablation   . TOTAL KNEE ARTHROPLASTY Left 02/06/2016   Procedure: TOTAL KNEE ARTHROPLASTY;  Surgeon: Frederik Pear, MD;  Location: [place];  Service: Orthopedics;  Laterality: Left;  . TUBAL LIGATION      Family History  Problem Relation Age of Onset  . Heart attack Brother        Massive MI in 75s  . Stroke Brother        CAD  . Kidney disease Mother   . Stroke Mother   . Diabetes Mother   . Liver disease Sister   . COPD Sister        had I-131 rx of hyperthyroidism  . Diabetes Sister   . Stroke Sister   . Cancer Paternal Uncle   . Cancer Cousin   . Heart failure Father   . Heart disease Father   . Arthritis Father   . Sarcoidosis Sister       Social History   Social History  . Marital status: Widowed    Spouse name: N/A  . Number of children: N/A  . Years of education: N/A   Occupational History  . Postal Worker Unemployed   Social History Main Topics  . Smoking status: Never Smoker  . Smokeless tobacco: Never Used  . Alcohol use No  . Drug use: No  . Sexual activity: Not Currently     Comment: declined condoms   Other Topics Concern  . None   Social History Narrative   Single/widow   Diet: good   Do you drink/eat things with caffeine? Tea occasionally   Marital status: Widowed  What year were you married? 1993   Do you live in a house, apartment,assisted living, condo,trailer,ect.)? House   Is it one or more stories? Two    How many persons live in your home? 4   Do you have any pets in you home? No   Current or past profession: Tour manager   Do you exercise? Yes  Type&how often: Stationary Bike, Water Exercise 3-4 x week   Do you have a living will? Yes   Do you have a DNR form? No  If not do you what one?   Do you have signed POA/HPOA forms? No   If so, please bring to your appointment.                   Allergies  Allergen Reactions  . Lisinopril Anaphylaxis and Swelling    Swelling of tongue and mouth 11/05/16- tolerates Olmesartan     Current Outpatient Prescriptions:  .  Cholecalciferol (VITAMIN D3) 2000 units TABS, Take 2,000 Units by mouth daily with lunch., Disp: , Rfl:  .  co-enzyme Q-10 30 MG capsule, Take 1 tablet by mouth daily., Disp: , Rfl:  .  darunavir-cobicistat (PREZCOBIX) 800-150 MG tablet, TAKE 1 TABLET BY MOUTH DAILY. SWALLOW WHOLE. DO NOT CRUSH, BREAK OR CHEW TABLETS. TAKE WITH FOOD. (Patient taking differently: Take 1 tablet by mouth every evening. TAKE 1 TABLET BY MOUTH DAILY. SWALLOW WHOLE. DO NOT CRUSH, BREAK OR CHEW TABLETS. TAKE WITH FOOD.), Disp: 30 tablet, Rfl: 11 .  dexamethasone (DECADRON) 4 MG tablet, Take 2 tablets (8 mg total) by mouth daily. Start the day after chemotherapy for 2 days. Take with food., Disp: 30 tablet, Rfl: 1 .  gabapentin (NEURONTIN) 300 MG capsule, Take 1 capsule (300 mg total) by mouth 4 (four) times daily., Disp: 120 capsule, Rfl: 4 .  HYDROcodone-acetaminophen (NORCO/VICODIN) 5-325 MG tablet, Take 1 tablet by mouth every 6 (six) hours as needed for moderate pain., Disp: 120 tablet, Rfl: 0 .  levothyroxine (SYNTHROID, LEVOTHROID) 175 MCG tablet, Take 1 tablet (175 mcg total) by mouth daily before breakfast., Disp: 90 tablet, Rfl: 1 .  lidocaine-prilocaine (EMLA) cream, Apply to affected area once, Disp: 30 g, Rfl: 3 .  loratadine (CLARITIN) 10 MG tablet, Take 10 mg by mouth daily as needed for itching. Reported on 06/08/2015, Disp: , Rfl:  .   LORazepam (ATIVAN) 0.5 MG tablet, Take 1 tablet (0.5 mg total) by mouth at bedtime as needed (Nausea or vomiting)., Disp: 30 tablet, Rfl: 0 .  methocarbamol (ROBAXIN) 500 MG tablet, Take 500 mg by mouth every 8 (eight) hours as needed for muscle spasms., Disp: , Rfl:  .  olmesartan-hydrochlorothiazide (BENICAR HCT) 40-25 MG tablet, TAKE 1 TABLET EVERY DAY (Patient taking differently: TAKE 1 TABLET EVERY DAY WITH LUNCH), Disp: 90 tablet, Rfl: 1 .  Omega-3 Fatty Acids (FISH OIL PO), Take 1 capsule by mouth daily with lunch., Disp: , Rfl:  .  Pitavastatin Calcium 4 MG TABS, Take 1 tablet (4 mg total) by mouth daily. This may be placebo, study provided. Do not dispense. (Patient taking differently: Take 4 mg by mouth daily with lunch. This may be placebo, study provided. Do not dispense.), Disp: 30 tablet, Rfl: 11 .  Probiotic Product (PROBIOTIC PO), Take 1 capsule by mouth daily with lunch., Disp: , Rfl:  .  prochlorperazine (COMPAZINE) 10 MG tablet, Take 1 tablet (10 mg total) by mouth every 6 (six) hours as needed (Nausea or vomiting)., Disp: 30 tablet, Rfl: 1 .  Dolutegravir-Rilpivirine (JULUCA) 50-25 MG TABS, Take 1 tablet by mouth daily with breakfast. Take with the Prezcobix., Disp: 30 tablet, Rfl: 9   Review of Systems  Constitutional: Negative for activity change, appetite change, chills, diaphoresis, fatigue, fever and unexpected weight change.  HENT: Negative for congestion, postnasal drip, rhinorrhea, sinus pressure, sneezing, sore throat and trouble swallowing.   Eyes: Negative for photophobia and visual disturbance.  Respiratory: Negative for cough, chest tightness, shortness of breath, wheezing and stridor.   Cardiovascular: Negative for chest pain, palpitations and leg swelling.  Gastrointestinal: Negative for abdominal distention, abdominal pain, anal bleeding, blood in stool, constipation, diarrhea, nausea and vomiting.  Genitourinary: Negative for difficulty urinating, dysuria, flank  pain and hematuria.  Musculoskeletal: Negative for back pain, gait problem, joint swelling and myalgias.  Skin: Positive for wound. Negative for pallor and rash.  Neurological: Negative for dizziness, tremors, weakness and light-headedness.  Hematological: Negative for adenopathy. Does not bruise/bleed easily.  Psychiatric/Behavioral: Negative for agitation, behavioral problems, confusion, decreased concentration, dysphoric mood and sleep disturbance.       Objective:   Physical Exam  Constitutional: She is oriented to person, place, and time. She  appears well-developed and well-nourished. No distress.  HENT:  Head: Normocephalic and atraumatic.  Mouth/Throat: Oropharynx is clear and moist. No oropharyngeal exudate.  Eyes: Conjunctivae and EOM are normal. Pupils are equal, round, and reactive to light. No scleral icterus.  Neck: Normal range of motion. Neck supple. No JVD present.  Cardiovascular: Normal rate and regular rhythm.   Pulmonary/Chest: Effort normal. No respiratory distress. She has no wheezes.  Abdominal: Soft. She exhibits no distension.  Musculoskeletal: She exhibits edema.  Lymphadenopathy:    She has no cervical adenopathy.  Neurological: She is alert and oriented to person, place, and time. She exhibits normal muscle tone. Coordination normal.  Skin: Skin is warm and dry. She is not diaphoretic. No erythema. No pallor.  Psychiatric: She has a normal mood and affect. Her behavior is normal. Judgment and thought content normal.   Her mastectomy scar is well healed. Porta-cath site is clean       Assessment & Plan:   Recurrent breast cancer on chemotherapy: make sure we check her CD4 count as she continues on therapy as she may have myelsuppression that might drop her CD4 below 200 where we would give PCP prophylaxis  She feels she has very healthy support network re processing, dealing with diagnosis and recurrence  Greatly appreciate Dr. Jana Hakim, [facility] Batman taking  care of [facility]  Abnormality of uterine wall with thickening: we will refer to Dr. Kennon Rounds (pt preference for workup of this abnormality)  HIV DISEASE   Change to  Carlyss and JULUCA with CHEWABLE MEAL AND NO PPI OR H2 blockers allowed!  Bring back to check labs and visit with Cassie in 2 months     HYPERTENSION : well controlled Vitals:   11/26/16 1101  BP: 108/73  Pulse: 75  Temp: 98.5 F (36.9 C)     Morbid obesity: to try to cut calories, carbohydrates  and to followup with Dr. Dewaine Oats  HYPOTHYROIDISM, POST-RADIATION  Followed by Dr. Loanne Drilling,.   Hyperlipidemia: Lipid Panel     Component Value Date/Time   CHOL 145 11/15/2016 1104   CHOL 191 10/15/2014 1007   TRIG 77 11/15/2016 1104   HDL 54 11/15/2016 1104   HDL 65 10/15/2014 1007   CHOLHDL 2.7 11/15/2016 1104   VLDL 15 11/15/2016 1104   LDLCALC 76 11/15/2016 1104   LDLCALC 99 10/15/2014 1007      CKD:  Lab Results  Component Value Date   CREATININE 1.41 (H) 11/12/2016   CREATININE 1.4 (H) 11/09/2016   CREATININE 1.31 (H) 11/06/2016     We spent greater than 40 minutes with the patient including greater than 50% of time in face to face counsel of the patient re her Breast cancer, her HIV,HTN, CKD, uterine abnormality and in coordination of her care.

## 2016-11-26 NOTE — Telephone Encounter (Signed)
Called and left voicemail on the cell phone requesting that patient call [id number], need to schedule appointment with Dr Kennon Rounds as requested in Referral.

## 2016-11-26 NOTE — Progress Notes (Addendum)
[facility]  Telephone:([phone] [phone]     ID: Kayla Price DOB: 03-20-1956  MR#: [id number]  JYN#:[id number]  Patient Care Team: Sharon Seller, NP as PCP - General (Nurse Practitioner) Daiva Eves, Lisette Grinder, MD as PCP - Infectious Diseases (Infectious Diseases) Ernesto Rutherford, MD as Consulting Physician (Ophthalmology) Romero Belling, MD as Consulting Physician (Endocrinology) Gean Birchwood, MD as Consulting Physician (Orthopedic Surgery) Jala Dundon, Valentino Hue, MD as Consulting Physician (Oncology) Claud Kelp, MD as Consulting Physician (General Surgery) Lowella Dell, MD OTHER MD:  CHIEF COMPLAINT: Triple negative breast cancer  CURRENT TREATMENT: Adjuvant chemotherapy  INTERVAL HISTORY: Kayla Price returns today for follow-up of her triple negative breast cancer. Today is day 8 cycle 1 of a minimum of 4 cycles planned of carboplatin and gemcitabine given days 1 and 8 of each 21 day cycle.  She did remarkably well with her first dose. She had more energy she says. She stayed up all night making jewelry. She had no nausea. She took the nausea medicines appropriately. The one side effects she did experience was constipation. She treated that by drinking chocolate milk and eating collard's.  REVIEW OF SYSTEMS: She's had no mouth sores. Her port is working well. She still has the drain in place and she just can't wait until it comes out. She hates the course that she has to wear. She still manages to exercise everyday and this morning for example she did some tai chi. Aside from those issues a detailed review of systems today was stable    BREAST CANCER HISTORY: From the original intake note:  Kayla Price is a history of right-sided breast cancer dating back to 2005. She had a lumpectomy with sentinel lymph node sampling, chemotherapy, and radiation. I do not have access to those records at present.  More recently she [facility] bilateral [facility] showing a possible mass in the right breast. Diagnostic mammography with ultrasonography on 09/28/2016 the patient underwent right diagnostic mammography with tomography and right breast ultrasonography. The breast density was category A. In the right breast at the 10:00 position there was an irregular mass measuring 2.5 cm. Ultrasound identified this the 10:00 radiant 10 cm from the nipple measuring 2.4 cm. In the right axilla there was an abnormal lymph node measuring 1.3 cm with other normal-appearing lymph nodes.  On 10/01/2016 she  underwent biopsy of the right breast mass in question as well as the suspicious axillary lymph node. 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 The proliferation marker was 70% in the breast lesion and 50% in the lymph node.  Her subsequent history is as detailed below.   PAST MEDICAL HISTORY: Past Medical History:  Diagnosis Date  . Alopecia areata 11/28/2009  . Bell's palsy   . BREAST CANCER, HX OF 2005  . Cancer [facility]) 2005   Breast cancer   chemotherapy and radiation  . CKD (chronic kidney disease) stage 3, GFR 30-59 ml/min 06/08/2015  . Dry eye syndrome   . Fasting hyperglycemia   . HIP FRACTURE, RIGHT 05/06/2008  . HIV (human immunodeficiency virus infection) (HCC)   . HIV DISEASE 03/27/2006  . HYPERLIPIDEMIA, MIXED 12/15/2007  . HYPERTENSION 03/27/2006  . 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 MENORRHAGIA, POSTMENOPAUSAL 02/03/2009  . Osteoarthritis of left knee 06/08/2015  . OSTEOARTHROSIS, LOCAL, SCND, UNSPC SITE 04/07/2007  . PVD 04/07/2007  .  Tinea capitis   . TRIGGER FINGER 05/06/2008  . Unspecified vitamin D deficiency 08/06/2007    PAST SURGICAL HISTORY: Past Surgical History:  Procedure Laterality Date  . BREAST LUMPECTOMY Right    2005  .  COLONOSCOPY    . HYSTEROSCOPY  2006  . KNEE ARTHROSCOPY Left 2002  . LYMPH NODE DISSECTION  2005  . MASTECTOMY MODIFIED RADICAL Right 11/05/2016   Procedure: RIGHT MASTECTOMY MODIFIED RADICAL;  Surgeon: Claud Kelp, MD;  Location: [facility] OR;  Service: General;  Laterality: Right;  . MODIFIED RADICAL MASTECTOMY Right 11/05/2016  . placement   of port-a-cath    . PORT-A-CATH REMOVAL  2006   insertion 2005  . PORTACATH PLACEMENT Right 11/05/2016   Procedure: INSERTION PORT-A-CATH WITH ULTRA SOUND;  Surgeon: Claud Kelp, MD;  Location[facility] OR;  Service: General;  Laterality: Right;  . removal of port a cath  12/2014  . THYROID SURGERY  2009   Ablation   . TOTAL KNEE ARTHROPLASTY Left 02/06/2016   Procedure: TOTAL KNEE ARTHROPLASTY;  Surgeon: Gean Birchwood, MD;  Location: [facility] OR;  Service: Orthopedics;  Laterality: Left;  . TUBAL LIGATION      FAMILY HISTORY Family History  Problem Relation Age of Onset  . Heart attack Brother        Massive MI in 23s  . Stroke Brother        CAD  . Kidney disease Mother   . Stroke Mother   . Diabetes Mother   . Liver disease Sister   . COPD Sister        had I-131 rx of hyperthyroidism  . Diabetes Sister   . Stroke Sister   . Cancer Paternal Uncle   . Cancer Cousin   . Heart failure Father   . Heart disease Father   . Arthritis Father   . Sarcoidosis Sister   The patient's father died at the age of 64 in the setting of Alzheimer's disease. The patient's mother died at the age of 70 from complications of diabetes. The patient has 3 brothers, 4 sisters. There is no history of breast or ovarian cancer in the family area   GYNECOLOGIC HISTORY:  Patient's last menstrual period was 11/06/2003.  menarche age 74, first live birth age 63, the patient is GX P2. She stopped having periods in 2005, with her chemotherapy; she never took hormone replacement   SOCIAL HISTORY:  Murdered work for the [employer] more than 20 years. 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 At home she lives with her son [name], her daughter [name] who works for [employer] and her granddaughter [name], 6 y/o AS OF APRIL 2018     ADVANCED DIRECTIVES:  not in place    HEALTH MAINTENANCE: Social History  Substance Use Topics  . Smoking status: Never Smoker  . Smokeless tobacco: Never Used  . Alcohol use No     Colonoscopy:  PAP:  Bone density:   Allergies  Allergen Reactions  . Lisinopril Anaphylaxis and Swelling    Swelling of tongue and mouth 11/05/16- tolerates Olmesartan  . Pepcid [Famotidine] Other (See Comments)    PPI H2, BLOCKERS LOWER GASTRIC PH WHICH WOULD LEAD TO SUBTHERAPEUTIC RILPIVIRINE LEVELS AND POTENTIAL VIROLOGICAL FAILURE WITH RESISTANCE  . Prilosec [Omeprazole] Other (See Comments)    PPI H2, BLOCKERS LOWER GASTRIC PH WHICH WOULD LEAD TO SUBTHERAPEUTIC RILPIVIRINE LEVELS AND POTENTIAL VIROLOGICAL FAILURE WITH RESISTANCE  . Tums [Calcium Carbonate  Antacid] Other (See Comments)    TUMS ANTACIDS CAN LOWER GASTRICWHICH COULD  LEAD TO SUBTHERAPEUTIC RILPIVIRINE LEVELS AND POTENTIAL VIROLOGICAL FAILURE WITH RESISTANCE TUMS CAN BE GIVEN BUT NEED CONSULT WITH ID PHARMACY RE TIMING. I PREFER HER TO AVOID ALL TOGETHER    Current Outpatient Prescriptions  Medication Sig Dispense Refill  . Cholecalciferol (VITAMIN D3) 2000 units TABS Take 2,000 Units by mouth daily with lunch.    . co-enzyme Q-10 30 MG capsule Take 1 tablet by mouth daily.    . darunavir-cobicistat (PREZCOBIX) 800-150 MG tablet TAKE 1 TABLET BY MOUTH DAILY. SWALLOW WHOLE. DO NOT CRUSH, BREAK OR CHEW TABLETS. TAKE WITH FOOD. (Patient taking differently: Take 1 tablet by mouth every evening. TAKE 1 TABLET BY MOUTH DAILY. SWALLOW WHOLE. DO NOT CRUSH, BREAK OR CHEW TABLETS. TAKE WITH FOOD.) 30 tablet 11  . dexamethasone (DECADRON) 4 MG tablet Take 2 tablets (8 mg total) by mouth daily. Start the day after chemotherapy for 2  days. Take with food. 30 tablet 1  . Dolutegravir-Rilpivirine (JULUCA) 50-25 MG TABS Take 1 tablet by mouth daily with breakfast. Take with the Prezcobix. 30 tablet 9  . gabapentin (NEURONTIN) 300 MG capsule Take 1 capsule (300 mg total) by mouth 4 (four) times daily. 120 capsule 4  . HYDROcodone-acetaminophen (NORCO/VICODIN) 5-325 MG tablet Take 1 tablet by mouth every 6 (six) hours as needed for moderate pain. 120 tablet 0  . levothyroxine (SYNTHROID, LEVOTHROID) 175 MCG tablet Take 1 tablet (175 mcg total) by mouth daily before breakfast. 90 tablet 1  . lidocaine-prilocaine (EMLA) cream Apply to affected area once 30 g 3  . loratadine (CLARITIN) 10 MG tablet Take 10 mg by mouth daily as needed for itching. Reported on 06/08/2015    . LORazepam (ATIVAN) 0.5 MG tablet Take 1 tablet (0.5 mg total) by mouth at bedtime as needed (Nausea or vomiting). 30 tablet 0  . methocarbamol (ROBAXIN) 500 MG tablet Take 500 mg by mouth every 8 (eight) hours as needed for muscle spasms.    Marland Kitchen olmesartan-hydrochlorothiazide (BENICAR HCT) 40-25 MG tablet TAKE 1 TABLET EVERY DAY (Patient taking differently: TAKE 1 TABLET EVERY DAY WITH LUNCH) 90 tablet 1  . Omega-3 Fatty Acids (FISH OIL PO) Take 1 capsule by mouth daily with lunch.    . Pitavastatin Calcium 4 MG TABS Take 1 tablet (4 mg total) by mouth daily. This may be placebo, study provided. Do not dispense. (Patient taking differently: Take 4 mg by mouth daily with lunch. This may be placebo, study provided. Do not dispense.) 30 tablet 11  . Probiotic Product (PROBIOTIC PO) Take 1 capsule by mouth daily with lunch.    . prochlorperazine (COMPAZINE) 10 MG tablet Take 1 tablet (10 mg total) by mouth every 6 (six) hours as needed (Nausea or vomiting). 30 tablet 1   No current facility-administered medications for this visit.     OBJECTIVE: Middle-aged African-American woman In no acute distress  There were no vitals filed for this visit.   There is no height or  weight on file to calculate BMI.    Vitals on 11/26/2016 showed a temperature of 97.5 pulse 68, respiratory rate 18 and blood pressure 134/85. Oxygen saturation on room air was 98%. Weight was 259.8 pounds    ECOG FS:0 - Asymptomatic  Sclerae unicteric, EOMs intact Oropharynx clear and moist No cervical or supraclavicular adenopathy Lungs no rales or rhonchi Heart regular rate and rhythm Abd soft, nontender, positive bowel sounds MSK no focal spinal tenderness,  no upper extremity lymphedema Neuro: nonfocal, well oriented, appropriate affect Breasts: Deferred  LAB RESULTS:  CMP     Component Value Date/Time   NA 141 11/26/2016 1234   K 3.9 11/26/2016 1234   CL 103 11/12/2016 0900   CO2 33 (H) 11/26/2016 1234   GLUCOSE 89 11/26/2016 1234   BUN 27.0 (H) 11/26/2016 1234   CREATININE 1.4 (H) 11/26/2016 1234   CALCIUM 10.9 (H) 11/26/2016 1234   PROT 6.9 11/26/2016 1234   ALBUMIN 3.4 (L) 11/26/2016 1234   AST 16 11/26/2016 1234   ALT 19 11/26/2016 1234   ALKPHOS 64 11/26/2016 1234   BILITOT 0.95 11/26/2016 1234   GFRNONAA 43 (L) 11/06/2016 0333   GFRNONAA 42 (L) 09/18/2016 0834   GFRAA 50 (L) 11/06/2016 0333   GFRAA 48 (L) 09/18/2016 0834    No results found for: TOTALPROTELP, ALBUMINELP, A1GS, A2GS, BETS, BETA2SER, GAMS, MSPIKE, SPEI  No results found for: Ron Parker, [facility]  Lab Results  Component Value Date   WBC 4.9 11/26/2016   NEUTROABS 1.7 11/26/2016   HGB 13.2 11/26/2016   HCT 39.6 11/26/2016   MCV 96.6 11/26/2016   PLT 165 11/26/2016      Chemistry      Component Value Date/Time   NA 141 11/26/2016 1234   K 3.9 11/26/2016 1234   CL 103 11/12/2016 0900   CO2 33 (H) 11/26/2016 1234   BUN 27.0 (H) 11/26/2016 1234   CREATININE 1.4 (H) 11/26/2016 1234   GLU 104 02/13/2016 1358      Component Value Date/Time   CALCIUM 10.9 (H) 11/26/2016 1234   ALKPHOS 64 11/26/2016 1234   AST 16 11/26/2016 1234   ALT 19 11/26/2016 1234   BILITOT  0.95 11/26/2016 1234       Lab Results  Component Value Date   LABCA2 11 04/20/2008    No components found for: [id number]  No results for input(s): INR in the last 168 hours.  Urinalysis    Component Value Date/Time   COLORURINE YELLOW 01/27/2016 1028   APPEARANCEUR CLEAR 01/27/2016 1028   LABSPEC 1.028 01/27/2016 1028   PHURINE 5.5 01/27/2016 1028   GLUCOSEU NEGATIVE 01/27/2016 1028   GLUCOSEU NEG mg/dL 95/62/1308 6578   HGBUR NEGATIVE 01/27/2016 1028   BILIRUBINUR NEGATIVE 01/27/2016 1028   KETONESUR NEGATIVE 01/27/2016 1028   PROTEINUR NEGATIVE 01/27/2016 1028   UROBILINOGEN 1 07/23/2006 2113   NITRITE NEGATIVE 01/27/2016 1028   LEUKOCYTESUR NEGATIVE 01/27/2016 1028     STUDIES: Ct Chest W Contrast  Result Date: 11/16/2016 CLINICAL DATA:  Recurrent right breast cancer. Mastectomy with nodal removal in May 2018 (drain still in place). EXAM: CT CHEST, ABDOMEN, AND PELVIS WITH CONTRAST TECHNIQUE: Multidetector CT imaging of the chest, abdomen and pelvis was performed following the standard protocol during bolus administration of intravenous contrast. CONTRAST:  ISOVUE-300 IOPAMIDOL (ISOVUE-300) INJECTION 61% COMPARISON:  Chest CT 04/28/2007.  Pelvic sonography 10/25/2009. FINDINGS: CT CHEST FINDINGS Cardiovascular: Right Port-A-Cath tip:  SVC. Mediastinum/Nodes: Right mastectomy and axillary dissection with postinflammatory findings and a right axillary drain still in place. Mild indistinctness of superficial fascia margins along the pectoralis musculature. On the left side there is a 0.9 cm subpectoral lymph node on image 15/2. Several small left axillary lymph nodes are present. No pathologic mediastinal or hilar adenopathy. Lungs/Pleura: Low-level stranding anteriorly in the right chest may be from prior radiation therapy. A 4 mm nodule posteriorly in the right upper lobe on image 62/4 is not readily apparent on  the prior CT chest from 04/28/2007. Musculoskeletal:  Thoracic spondylosis. CT ABDOMEN PELVIS FINDINGS Hepatobiliary: Dependent density in the gallbladder favoring small gallstones, images 68-69 series 2. Punctate likely postinflammatory calcification posteriorly along the right hepatic lobe capsule, image 50/2. Pancreas: Unremarkable Spleen: Unremarkable Adrenals/Urinary Tract: Unremarkable Stomach/Bowel: Scattered descending and sigmoid colon diverticula without active diverticulitis identified. Vascular/Lymphatic: 1.2 cm left external iliac lymph node on image 107/2. Reproductive: Mild nodularity along the left posterior uterine body probably from a small fibroid. The endometrium is estimated at up to 11 mm in thickness. Other: No supplemental non-categorized findings. Musculoskeletal: Lumbar spondylosis and degenerative disc disease with grade 1 degenerative anterolisthesis at L4-5 at L5-S1, and mild foraminal impingement at both of these levels. IMPRESSION: 1. Several findings warrant surveillance including a 9 mm left subpectoral lymph node and a 1.2 cm left external iliac lymph node, as well as a 4 mm nodule in the right upper lobe. Overall these findings are probably benign but warrants surveillance in this setting. Otherwise, no obvious/specific signs of active malignancy. 2. Recent right mastectomy. 3. Cholelithiasis. 4. Mild diverticulosis without active diverticulitis. 5. The endometrium is thickened at up to 11 mm. On prior sonographic workup in 2011, the patient had an even more thickened endometrium in the setting of postmenopausal bleeding, correlation without workup is recommended. The current endometrial thickness of 11 mm is considered abnormal, and pelvic sonography should be strongly considered. 6. Mild lower lumbar impingement related to spondylosis and degenerative disc disease. Electronically Signed   By: Gaylyn Rong M.D.   On: 11/16/2016 10:23   Ct Abdomen Pelvis W Contrast  Result Date: 11/16/2016 CLINICAL DATA:  Recurrent right  breast cancer. Mastectomy with nodal removal in May 2018 (drain still in place). EXAM: CT CHEST, ABDOMEN, AND PELVIS WITH CONTRAST TECHNIQUE: Multidetector CT imaging of the chest, abdomen and pelvis was performed following the standard protocol during bolus administration of intravenous contrast. CONTRAST:  ISOVUE-300 IOPAMIDOL (ISOVUE-300) INJECTION 61% COMPARISON:  Chest CT 04/28/2007.  Pelvic sonography 10/25/2009. FINDINGS: CT CHEST FINDINGS Cardiovascular: Right Port-A-Cath tip:  SVC. Mediastinum/Nodes: Right mastectomy and axillary dissection with postinflammatory findings and a right axillary drain still in place. Mild indistinctness of superficial fascia margins along the pectoralis musculature. On the left side there is a 0.9 cm subpectoral lymph node on image 15/2. Several small left axillary lymph nodes are present. No pathologic mediastinal or hilar adenopathy. Lungs/Pleura: Low-level stranding anteriorly in the right chest may be from prior radiation therapy. A 4 mm nodule posteriorly in the right upper lobe on image 62/4 is not readily apparent on the prior CT chest from 04/28/2007. Musculoskeletal: Thoracic spondylosis. CT ABDOMEN PELVIS FINDINGS Hepatobiliary: Dependent density in the gallbladder favoring small gallstones, images 68-69 series 2. Punctate likely postinflammatory calcification posteriorly along the right hepatic lobe capsule, image 50/2. Pancreas: Unremarkable Spleen: Unremarkable Adrenals/Urinary Tract: Unremarkable Stomach/Bowel: Scattered descending and sigmoid colon diverticula without active diverticulitis identified. Vascular/Lymphatic: 1.2 cm left external iliac lymph node on image 107/2. Reproductive: Mild nodularity along the left posterior uterine body probably from a small fibroid. The endometrium is estimated at up to 11 mm in thickness. Other: No supplemental non-categorized findings. Musculoskeletal: Lumbar spondylosis and degenerative disc disease with grade 1  degenerative anterolisthesis at L4-5 at L5-S1, and mild foraminal impingement at both of these levels. IMPRESSION: 1. Several findings warrant surveillance including a 9 mm left subpectoral lymph node and a 1.2 cm left external iliac lymph node, as well as a 4 mm nodule in the right  upper lobe. Overall these findings are probably benign but warrants surveillance in this setting. Otherwise, no obvious/specific signs of active malignancy. 2. Recent right mastectomy. 3. Cholelithiasis. 4. Mild diverticulosis without active diverticulitis. 5. The endometrium is thickened at up to 11 mm. On prior sonographic workup in 2011, the patient had an even more thickened endometrium in the setting of postmenopausal bleeding, correlation without workup is recommended. The current endometrial thickness of 11 mm is considered abnormal, and pelvic sonography should be strongly considered. 6. Mild lower lumbar impingement related to spondylosis and degenerative disc disease. Electronically Signed   By: Gaylyn Rong M.D.   On: 11/16/2016 10:23   Dg Chest Port 1 View  Result Date: 11/05/2016 CLINICAL DATA:  Status post Port-A-Cath insertion EXAM: PORTABLE CHEST 1 VIEW COMPARISON:  None FINDINGS: There is a right subclavian port a catheter with tip at the projection of the distal SVC. The heart size appears normal. No pleural effusion or edema. No airspace opacities. A surgical drain overlies the right chest wall. IMPRESSION: 1. Right subclavian port a catheter is identified with tip in the projection of the distal SVC. Electronically Signed   By: Signa Kell M.D.   On: 11/05/2016 11:06   Dg Fluoro Guide Cv Line-no Report  Result Date: 11/05/2016 Fluoroscopy was utilized by the requesting physician.  No radiographic interpretation.    ELIGIBLE FOR AVAILABLE RESEARCH PROTOCOL: no  ASSESSMENT: 61 y.o. [place] woman   (1) status post right lumpectomy and sentinel lymph node sampling October 2005 for a 0.6 cm  invasive ductal carcinoma involving one out of 2 sentinel lymph nodes sampled, grade 3, triple-negative, treated adjuvantly with doxorubicin and cyclophosphamide 4 followed by weekly paclitaxel 7, followed by adjuvant radiation  (2) status post right breast upper outer quadrant biopsy and right axillary lymph node biopsy 10/01/2016, both positive for a T2 N1, stage IIIB invasive ductal carcinoma, triple negative, with an MIB-1 of 50-70%  (3) status post right modified radical mastectomy 11/05/2016 showing a pT2 pN1, stage IIIB invasive ductal carcinoma, grade 3, triple negative, with negative margins  (4) Not a candidate for radiation given prior history  (5) adjuvant chemotherapy will consist of carboplatin and gemcitabine given days 1 and 8 of each 21 day cycle, for 6 cycles  (5) HIV positivity  PLAN: Reginna tolerated her first dose of carboplatin and gemcitabine remarkably well, and we are proceeding with the second dose, cycle 1 day 8 today.  I have not added Neulasta to her treatment regimen but we will check lab work a week from today. If the nadir appears low we will at that beginning day 9 cycle 2  Hopefully her drainage will continue to decrease in she will have her drain out by the time of the start of cycle 2, 2 weeks from now.  At this point I am encouraged that she is doing so well and the plan will be for minimum of 4 cycles (8 doses) preferably 6 cycles (12 doses) of the current treatment  Lowella Dell, MD   11/26/2016 2:17 PM Medical Oncology and Hematology [facility] [address] [place], [place] [address] Tel. [phone]    Fax. [phone]

## 2016-11-26 NOTE — Progress Notes (Signed)
HPI: Kayla Price is a 61 y.o. female who presents to the [facility] to follow-up with Dr. Tommy Price for her HIV infection.   Allergies: Allergies  Allergen Reactions  . Lisinopril Anaphylaxis and Swelling    Swelling of tongue and mouth 11/05/16- tolerates Olmesartan    Past Medical History: Past Medical History:  Diagnosis Date  . Alopecia areata 11/28/2009  . Bell's palsy   . BREAST CANCER, HX OF 2005  . Cancer [facility]) 2005   Breast cancer   chemotherapy and radiation  . CKD (chronic kidney disease) stage 3, GFR 30-59 ml/min 06/08/2015  . Dry eye syndrome   . Fasting hyperglycemia   . HIP FRACTURE, RIGHT 05/06/2008  . HIV (human immunodeficiency virus infection) ([place])   . HIV DISEASE 03/27/2006  . HYPERLIPIDEMIA, MIXED 12/15/2007  . HYPERTENSION 03/27/2006  . 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 MENORRHAGIA, POSTMENOPAUSAL 02/03/2009  . Osteoarthritis of left knee 06/08/2015  . OSTEOARTHROSIS, LOCAL, SCND, UNSPC SITE 04/07/2007  . PVD 04/07/2007  . Tinea capitis   . TRIGGER FINGER 05/06/2008  . Unspecified vitamin D deficiency 08/06/2007    Social History: Social History   Social History  . Marital status: Widowed    Spouse name: N/A  . Number of children: N/A  . Years of education: N/A   Occupational History  . Postal Worker Unemployed   Social History Main Topics  . Smoking status: Never Smoker  . Smokeless tobacco: Never Used  . Alcohol use No  . Drug use: No  . Sexual activity: Not Currently     Comment: declined condoms   Other Topics Concern  . None   Social History Narrative   Single/widow   Diet: good   Do you drink/eat things with caffeine? Tea occasionally   Marital status: Widowed  What year were you married? 1993   Do you live in a house, apartment,assisted living, condo,trailer,ect.)? House   Is it one or more stories? Two   How many persons live in your home? 4   Do you have any pets in you home? No   Current or past profession:  Tour manager   Do you exercise? Yes  Type&how often: Stationary Bike, Water Exercise 3-4 x week   Do you have a living will? Yes   Do you have a DNR form? No  If not do you what one?   Do you have signed POA/HPOA forms? No   If so, please bring to your appointment.                   Current Regimen: Edurant + Tivicay + Prezcobix  Labs: HIV 1 RNA Quant (copies/mL)  Date Value  11/15/2016 <20 NOT DETECTED  05/28/2016 <20  05/14/2016 128 (H)   CD4 T Cell Abs (/uL)  Date Value  11/15/2016 760  05/14/2016 1,010  11/10/2015 810   Hep B S Ab (no units)  Date Value  08/19/2006 No   Hepatitis B Surface Ag (no units)  Date Value  08/19/2006 No   HCV Ab (no units)  Date Value  04/08/2014 NEGATIVE    CrCl: Estimated Creatinine Clearance: 56.4 mL/min (A) (by C-G formula based on SCr of 1.41 mg/dL (H)).  Lipids:    Component Value Date/Time   CHOL 145 11/15/2016 1104   CHOL 191 10/15/2014 1007   TRIG 77 11/15/2016 1104   HDL 54 11/15/2016 1104   HDL 65 10/15/2014 1007   CHOLHDL 2.7 11/15/2016 1104   VLDL 15  11/15/2016 1104   LDLCALC 76 11/15/2016 1104   LDLCALC 99 10/15/2014 1007    Assessment: Kayla Price is here today for HIV follow-up.  Since she was last seen, she was diagnosed with recurrent breast cancer.  She has had a mastectomy about 1 month ago and is doing well since then.  She is currently on chemotherapy - gemcitabine and carboplatin.  No interactions with her prezcobix, tivicay, or edurant.  We will switch her to Juluca + Prezcobix today for simplification.  She is to start that when she finishes her current bottles of tivicay and edurant.  Counseled her about the switch - everything should remain the same, no acid reflux medications and take with a meal.  She will come back and see me in ~2 months to get labs - including a CBC with diff, HIV VL, and CD4. She gets her medications mailed from [facility].  Plans: - Stop Edurant and Tivicay - Start Juluca 1 pill  once daily with a meal - Continue Prezcobix 1 pill once daily with a meal - Mail from [facility] - F/u with me 8/1 at [place]. Kayla Price, PharmD, [facility] 11/26/2016, 11:58 AM

## 2016-11-30 ENCOUNTER — Other Ambulatory Visit (HOSPITAL_BASED_OUTPATIENT_CLINIC_OR_DEPARTMENT_OTHER): Payer: Medicare HMO

## 2016-11-30 DIAGNOSIS — C50911 Malignant neoplasm of unspecified site of right female breast: Secondary | ICD-10-CM

## 2016-11-30 DIAGNOSIS — Z171 Estrogen receptor negative status [ER-]: Principal | ICD-10-CM

## 2016-11-30 DIAGNOSIS — C50411 Malignant neoplasm of upper-outer quadrant of right female breast: Secondary | ICD-10-CM | POA: Diagnosis not present

## 2016-11-30 LAB — COMPREHENSIVE METABOLIC PANEL
ALT: 22 U/L (ref 0–55)
AST: 22 U/L (ref 5–34)
Albumin: 3.1 g/dL — ABNORMAL LOW (ref 3.5–5.0)
Alkaline Phosphatase: 59 U/L (ref 40–150)
Anion Gap: 9 mEq/L (ref 3–11)
BUN: 21.1 mg/dL (ref 7.0–26.0)
CALCIUM: 10.1 mg/dL (ref 8.4–10.4)
CHLORIDE: 103 meq/L (ref 98–109)
CO2: 29 mEq/L (ref 22–29)
Creatinine: 1.4 mg/dL — ABNORMAL HIGH (ref 0.6–1.1)
EGFR: 48 mL/min/{1.73_m2} — ABNORMAL LOW (ref >90)
Glucose: 119 mg/dl (ref 70–140)
POTASSIUM: 4.3 meq/L (ref 3.5–5.1)
Sodium: 141 mEq/L (ref 136–145)
Total Bilirubin: 0.99 mg/dL (ref 0.20–1.20)
Total Protein: 6.4 g/dL (ref 6.4–8.3)

## 2016-11-30 LAB — CBC WITH DIFFERENTIAL/PLATELET
BASO%: 0.1 % (ref 0.0–2.0)
BASOS ABS: 0 10*3/uL (ref 0.0–0.1)
EOS%: 2.2 % (ref 0.0–7.0)
Eosinophils Absolute: 0.1 10*3/uL (ref 0.0–0.5)
HEMATOCRIT: 35.2 % (ref 34.8–46.6)
HGB: 11.7 g/dL (ref 11.6–15.9)
LYMPH#: 2.3 10*3/uL (ref 0.9–3.3)
LYMPH%: 52.8 % — AB (ref 14.0–49.7)
MCH: 32.3 pg (ref 25.1–34.0)
MCHC: 33.3 g/dL (ref 31.5–36.0)
MCV: 97.1 fL (ref 79.5–101.0)
MONO#: 0 10*3/uL — ABNORMAL LOW (ref 0.1–0.9)
MONO%: 0.2 % (ref 0.0–14.0)
NEUT#: 2 10*3/uL (ref 1.5–6.5)
NEUT%: 44.7 % (ref 38.4–76.8)
Platelets: 91 10*3/uL — ABNORMAL LOW (ref 145–400)
RBC: 3.62 10*6/uL — AB (ref 3.70–5.45)
RDW: 13.2 % (ref 11.2–14.5)
WBC: 4.4 10*3/uL (ref 3.9–10.3)

## 2016-12-03 ENCOUNTER — Other Ambulatory Visit: Payer: Medicare HMO

## 2016-12-05 ENCOUNTER — Telehealth: Payer: Self-pay | Admitting: Radiology

## 2016-12-05 NOTE — Telephone Encounter (Signed)
Left voicemail on cell phone to call back [id number] to schedule appointment with Dr Kennon Rounds.

## 2016-12-10 ENCOUNTER — Ambulatory Visit (HOSPITAL_BASED_OUTPATIENT_CLINIC_OR_DEPARTMENT_OTHER): Payer: Medicare HMO

## 2016-12-10 ENCOUNTER — Ambulatory Visit (HOSPITAL_BASED_OUTPATIENT_CLINIC_OR_DEPARTMENT_OTHER): Payer: Medicare HMO | Admitting: Oncology

## 2016-12-10 ENCOUNTER — Ambulatory Visit: Payer: Medicare HMO

## 2016-12-10 ENCOUNTER — Other Ambulatory Visit (HOSPITAL_BASED_OUTPATIENT_CLINIC_OR_DEPARTMENT_OTHER): Payer: Medicare HMO

## 2016-12-10 VITALS — BP 109/69 | HR 76 | Temp 97.9°F | Resp 17 | Ht 67.0 in | Wt 266.8 lb

## 2016-12-10 DIAGNOSIS — Z171 Estrogen receptor negative status [ER-]: Secondary | ICD-10-CM

## 2016-12-10 DIAGNOSIS — C50911 Malignant neoplasm of unspecified site of right female breast: Secondary | ICD-10-CM

## 2016-12-10 DIAGNOSIS — C773 Secondary and unspecified malignant neoplasm of axilla and upper limb lymph nodes: Secondary | ICD-10-CM | POA: Diagnosis not present

## 2016-12-10 DIAGNOSIS — D709 Neutropenia, unspecified: Secondary | ICD-10-CM | POA: Diagnosis not present

## 2016-12-10 DIAGNOSIS — B20 Human immunodeficiency virus [HIV] disease: Secondary | ICD-10-CM

## 2016-12-10 DIAGNOSIS — D72819 Decreased white blood cell count, unspecified: Secondary | ICD-10-CM | POA: Diagnosis not present

## 2016-12-10 DIAGNOSIS — C50411 Malignant neoplasm of upper-outer quadrant of right female breast: Secondary | ICD-10-CM | POA: Diagnosis not present

## 2016-12-10 LAB — CBC WITH DIFFERENTIAL/PLATELET
BASO%: 0.5 % (ref 0.0–2.0)
Basophils Absolute: 0 10*3/uL (ref 0.0–0.1)
EOS ABS: 0 10*3/uL (ref 0.0–0.5)
EOS%: 0.9 % (ref 0.0–7.0)
HEMATOCRIT: 33.5 % — AB (ref 34.8–46.6)
HGB: 11.2 g/dL — ABNORMAL LOW (ref 11.6–15.9)
LYMPH#: 1.2 10*3/uL (ref 0.9–3.3)
LYMPH%: 52.3 % — ABNORMAL HIGH (ref 14.0–49.7)
MCH: 32.3 pg (ref 25.1–34.0)
MCHC: 33.4 g/dL (ref 31.5–36.0)
MCV: 96.5 fL (ref 79.5–101.0)
MONO#: 0.3 10*3/uL (ref 0.1–0.9)
MONO%: 11.7 % (ref 0.0–14.0)
NEUT%: 34.6 % — AB (ref 38.4–76.8)
NEUTROS ABS: 0.8 10*3/uL — AB (ref 1.5–6.5)
PLATELETS: 264 10*3/uL (ref 145–400)
RBC: 3.47 10*6/uL — ABNORMAL LOW (ref 3.70–5.45)
RDW: 13.1 % (ref 11.2–14.5)
WBC: 2.2 10*3/uL — AB (ref 3.9–10.3)

## 2016-12-10 LAB — COMPREHENSIVE METABOLIC PANEL
ALK PHOS: 79 U/L (ref 40–150)
ALT: 18 U/L (ref 0–55)
AST: 19 U/L (ref 5–34)
Albumin: 3.1 g/dL — ABNORMAL LOW (ref 3.5–5.0)
Anion Gap: 6 mEq/L (ref 3–11)
BUN: 10.7 mg/dL (ref 7.0–26.0)
CALCIUM: 10.2 mg/dL (ref 8.4–10.4)
CO2: 31 mEq/L — ABNORMAL HIGH (ref 22–29)
CREATININE: 1.5 mg/dL — AB (ref 0.6–1.1)
Chloride: 105 mEq/L (ref 98–109)
EGFR: 45 mL/min/{1.73_m2} — AB (ref >90)
Glucose: 102 mg/dl (ref 70–140)
Potassium: 3.7 mEq/L (ref 3.5–5.1)
Sodium: 141 mEq/L (ref 136–145)
TOTAL PROTEIN: 6.9 g/dL (ref 6.4–8.3)

## 2016-12-10 MED ORDER — TBO-FILGRASTIM 480 MCG/0.8ML ~~LOC~~ SOSY
480.0000 ug | PREFILLED_SYRINGE | Freq: Once | SUBCUTANEOUS | Status: AC
  Administered 2016-12-10: 480 ug via SUBCUTANEOUS
  Filled 2016-12-10: qty 0.8

## 2016-12-10 NOTE — Progress Notes (Signed)
[facility]  [phone] [phone]     ID: Kayla Price DOB: 1955-09-02  MR#: [id number]  HYQ#:[id number]  Patient Care Team: Sharon Seller, NP as PCP - General (Nurse Practitioner) Daiva Eves, Lisette Grinder, MD as PCP - Infectious Diseases (Infectious Diseases) Ernesto Rutherford, MD as Consulting Physician (Ophthalmology) Romero Belling, MD as Consulting Physician (Endocrinology) Gean Birchwood, MD as Consulting Physician (Orthopedic Surgery) Chrisean Kloth, Valentino Hue, MD as Consulting Physician (Oncology) Claud Kelp, MD as Consulting Physician (General Surgery) Lowella Dell, MD OTHER MD:  CHIEF COMPLAINT: Triple negative breast cancer, recurrent  CURRENT TREATMENT: Adjuvant chemotherapy  INTERVAL HISTORY: Kayla Price returns today for evaluation and treatment of her recurrent triple negative breast cancer. Today is day 1 cycle 2 of 6 planned cycles of carboplatin and gemcitabine which she receives on days 1 and 8 of each 21 day cycle.  She has had no nausea even though she actually took only 4 mg of Decadron twice daily for 2 days instead of 8 mg. She also had no problems with thrush.  REVIEW OF SYSTEMS: She enjoyed being off last week. She has lost a little bit of appetite, although her sense of taste is intact. She has lost a couple pounds. [facility] [place] mile and a half she remains mildly constipated which is chronic with her. There have been no mouth sores, no worsening peripheral neuropathy, no intercurrent fever. She has mild arthritis which is not more intense or persistent than before. A detailed review of systems today was otherwise stable.  BREAST CANCER HISTORY: From the original intake note:  Kayla Price is a history of right-sided breast cancer dating back to 2005. She had a lumpectomy with sentinel lymph node sampling, chemotherapy, and radiation. I do not have access to those records at present.  More  recently [facility] showing a possible mass in the right breast. Diagnostic mammography with ultrasonography on 09/28/2016 the patient underwent right diagnostic mammography with tomography and right breast ultrasonography. The breast density was category A. In the right breast at the 10:00 position there was an irregular mass measuring 2.5 cm. Ultrasound identified this the 10:00 radiant 10 cm from the nipple measuring 2.4 cm. In the right axilla there was an abnormal lymph node measuring 1.3 cm with other normal-appearing lymph nodes.  On 10/01/2016 she  underwent biopsy of the right breast mass in question as well as the suspicious axillary lymph node. 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 The proliferation marker was 70% in the breast lesion and 50% in the lymph node.  Her subsequent history is as detailed below.   PAST MEDICAL HISTORY: Past Medical History:  Diagnosis Date  . Alopecia areata 11/28/2009  . Bell's palsy   . BREAST CANCER, HX OF 2005  . Cancer [facility]) 2005   Breast cancer   chemotherapy and radiation  . CKD (chronic kidney disease) stage 3, GFR 30-59 ml/min 06/08/2015  . Dry eye syndrome   . Fasting hyperglycemia   . HIP FRACTURE, RIGHT 05/06/2008  . HIV (human immunodeficiency virus infection) (HCC)   . HIV DISEASE 03/27/2006  . HYPERLIPIDEMIA, MIXED 12/15/2007  . HYPERTENSION 03/27/2006  . 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 MENORRHAGIA, POSTMENOPAUSAL 02/03/2009  . Osteoarthritis of left knee 06/08/2015  . OSTEOARTHROSIS, LOCAL, SCND, UNSPC SITE  04/07/2007  . PVD 04/07/2007  . Tinea capitis   . TRIGGER FINGER 05/06/2008  . Unspecified vitamin D deficiency 08/06/2007    PAST SURGICAL HISTORY: Past Surgical History:  Procedure Laterality Date  . BREAST  LUMPECTOMY Right    2005  . COLONOSCOPY    . HYSTEROSCOPY  2006  . KNEE ARTHROSCOPY Left 2002  . LYMPH NODE DISSECTION  2005  . MASTECTOMY MODIFIED RADICAL Right 11/05/2016   Procedure: RIGHT MASTECTOMY MODIFIED RADICAL;  Surgeon: Claud Kelp, MD;  Location: [facility] OR;  Service: General;  Laterality: Right;  . MODIFIED RADICAL MASTECTOMY Right 11/05/2016  . placement   of port-a-cath    . PORT-A-CATH REMOVAL  2006   insertion 2005  . PORTACATH PLACEMENT Right 11/05/2016   Procedure: INSERTION PORT-A-CATH WITH ULTRA SOUND;  Surgeon: Claud Kelp, MD;  Location: [facility] OR;  Service: General;  Laterality: Right;  . removal of port a cath  12/2014  . THYROID SURGERY  2009   Ablation   . TOTAL KNEE ARTHROPLASTY Left 02/06/2016   Procedure: TOTAL KNEE ARTHROPLASTY;  Surgeon: Gean Birchwood, MD;  Location: [facility] OR;  Service: Orthopedics;  Laterality: Left;  . TUBAL LIGATION      FAMILY HISTORY Family History  Problem Relation Age of Onset  . Heart attack Brother        Massive MI in 28s  . Stroke Brother        CAD  . Kidney disease Mother   . Stroke Mother   . Diabetes Mother   . Liver disease Sister   . COPD Sister        had I-131 rx of hyperthyroidism  . Diabetes Sister   . Stroke Sister   . Cancer Paternal Uncle   . Cancer Cousin   . Heart failure Father   . Heart disease Father   . Arthritis Father   . Sarcoidosis Sister   The patient's father died at the age of 53 in the setting of Alzheimer's disease. The patient's mother died at the age of 57 from complications of diabetes. The patient has 3 brothers, 4 sisters. There is no history of breast or ovarian cancer in the family area   GYNECOLOGIC HISTORY:  Patient's last menstrual period was 11/06/2003.  menarche age 1, first live birth age 82, the patient is GX P2. She stopped having periods in 2005, with her chemotherapy; she never took hormone replacement   SOCIAL HISTORY:  Murdered work for the [employer] more than 20  years. 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 At home she lives with her son Kayla Price, her daughter Kayla Price who works for [employer] and her granddaughter Kayla Price, 53 y/o AS OF APRIL 2018     ADVANCED DIRECTIVES:  not in place    HEALTH MAINTENANCE: Social History  Substance Use Topics  . Smoking status: Never Smoker  . Smokeless tobacco: Never Used  . Alcohol use No     Colonoscopy:  PAP:  Bone density:   Allergies  Allergen Reactions  . Lisinopril Anaphylaxis and Swelling    Swelling of tongue and mouth 11/05/16- tolerates Olmesartan  . Pepcid [Famotidine] Other (See Comments)    PPI H2, BLOCKERS LOWER GASTRIC PH WHICH WOULD LEAD TO SUBTHERAPEUTIC RILPIVIRINE LEVELS AND POTENTIAL VIROLOGICAL FAILURE WITH RESISTANCE  . Prilosec [Omeprazole] Other (See Comments)    PPI H2, BLOCKERS LOWER GASTRIC PH WHICH WOULD LEAD TO SUBTHERAPEUTIC RILPIVIRINE LEVELS AND POTENTIAL VIROLOGICAL FAILURE  WITH RESISTANCE  . Tums [Calcium Carbonate Antacid] Other (See Comments)    TUMS ANTACIDS CAN LOWER GASTRICWHICH COULD  LEAD TO SUBTHERAPEUTIC RILPIVIRINE LEVELS AND POTENTIAL VIROLOGICAL FAILURE WITH RESISTANCE TUMS CAN BE GIVEN BUT NEED CONSULT WITH ID PHARMACY RE TIMING. I PREFER HER TO AVOID ALL TOGETHER    Current Outpatient Prescriptions  Medication Sig Dispense Refill  . Cholecalciferol (VITAMIN D3) 2000 units TABS Take 2,000 Units by mouth daily with lunch.    . co-enzyme Q-10 30 MG capsule Take 1 tablet by mouth daily.    . darunavir-cobicistat (PREZCOBIX) 800-150 MG tablet TAKE 1 TABLET BY MOUTH DAILY. SWALLOW WHOLE. DO NOT CRUSH, BREAK OR CHEW TABLETS. TAKE WITH FOOD. (Patient taking differently: Take 1 tablet by mouth every evening. TAKE 1 TABLET BY MOUTH DAILY. SWALLOW WHOLE. DO NOT CRUSH, BREAK OR CHEW TABLETS. TAKE WITH FOOD.) 30 tablet 11  . dexamethasone (DECADRON) 4 MG tablet Take 2 tablets (8 mg total) by mouth daily. Start the day  after chemotherapy for 2 days. Take with food. 30 tablet 1  . Dolutegravir-Rilpivirine (JULUCA) 50-25 MG TABS Take 1 tablet by mouth daily with breakfast. Take with the Prezcobix. 30 tablet 9  . gabapentin (NEURONTIN) 300 MG capsule Take 1 capsule (300 mg total) by mouth 4 (four) times daily. 120 capsule 4  . HYDROcodone-acetaminophen (NORCO/VICODIN) 5-325 MG tablet Take 1 tablet by mouth every 6 (six) hours as needed for moderate pain. 120 tablet 0  . levothyroxine (SYNTHROID, LEVOTHROID) 175 MCG tablet Take 1 tablet (175 mcg total) by mouth daily before breakfast. 90 tablet 1  . lidocaine-prilocaine (EMLA) cream Apply to affected area once 30 g 3  . loratadine (CLARITIN) 10 MG tablet Take 10 mg by mouth daily as needed for itching. Reported on 06/08/2015    . LORazepam (ATIVAN) 0.5 MG tablet Take 1 tablet (0.5 mg total) by mouth at bedtime as needed (Nausea or vomiting). 30 tablet 0  . methocarbamol (ROBAXIN) 500 MG tablet Take 500 mg by mouth every 8 (eight) hours as needed for muscle spasms.    Marland Kitchen olmesartan-hydrochlorothiazide (BENICAR HCT) 40-25 MG tablet TAKE 1 TABLET EVERY DAY (Patient taking differently: TAKE 1 TABLET EVERY DAY WITH LUNCH) 90 tablet 1  . Omega-3 Fatty Acids (FISH OIL PO) Take 1 capsule by mouth daily with lunch.    . Pitavastatin Calcium 4 MG TABS Take 1 tablet (4 mg total) by mouth daily. This may be placebo, study provided. Do not dispense. (Patient taking differently: Take 4 mg by mouth daily with lunch. This may be placebo, study provided. Do not dispense.) 30 tablet 11  . Probiotic Product (PROBIOTIC PO) Take 1 capsule by mouth daily with lunch.    . prochlorperazine (COMPAZINE) 10 MG tablet Take 1 tablet (10 mg total) by mouth every 6 (six) hours as needed (Nausea or vomiting). 30 tablet 1   No current facility-administered medications for this visit.     OBJECTIVE: Middle-aged African-American woman Who appears stated age  Vitals:   12/10/16 1104  BP: 109/69    Pulse: 76  Resp: 17  Temp: 97.9 F (36.6 C)     Body mass index is 41.79 kg/m.        ECOG FS:0 - Asymptomatic  Sclerae unicteric, pupils round and equal Oropharynx clear and moist No cervical or supraclavicular adenopathy Lungs no rales or rhonchi Heart regular rate and rhythm Abd soft, nontender, positive bowel sounds MSK no focal spinal tenderness, no upper extremity lymphedema Neuro: nonfocal, well oriented,  appropriate affect Breasts: The right breast is status post mastectomy. There is no evidence of local recurrence. There is no dehiscence or erythema or swelling. The left breast is unremarkable. Both axillae are benign.   LAB RESULTS:  CMP     Component Value Date/Time   NA 141 12/10/2016 1044   K 3.7 12/10/2016 1044   CL 103 11/12/2016 0900   CO2 31 (H) 12/10/2016 1044   GLUCOSE 102 12/10/2016 1044   BUN 10.7 12/10/2016 1044   CREATININE 1.5 (H) 12/10/2016 1044   CALCIUM 10.2 12/10/2016 1044   PROT 6.9 12/10/2016 1044   ALBUMIN 3.1 (L) 12/10/2016 1044   AST 19 12/10/2016 1044   ALT 18 12/10/2016 1044   ALKPHOS 79 12/10/2016 1044   BILITOT <0.22 12/10/2016 1044   GFRNONAA 43 (L) 11/06/2016 0333   GFRNONAA 42 (L) 09/18/2016 0834   GFRAA 50 (L) 11/06/2016 0333   GFRAA 48 (L) 09/18/2016 0834    No results found for: TOTALPROTELP, ALBUMINELP, A1GS, A2GS, BETS, BETA2SER, GAMS, MSPIKE, SPEI  No results found for: KPAFRELGTCHN, LAMBDASER, KAPLAMBRATIO  Lab Results  Component Value Date   WBC 2.2 (L) 12/10/2016   NEUTROABS 0.8 (L) 12/10/2016   HGB 11.2 (L) 12/10/2016   HCT 33.5 (L) 12/10/2016   MCV 96.5 12/10/2016   PLT 264 12/10/2016      Chemistry      Component Value Date/Time   NA 141 12/10/2016 1044   K 3.7 12/10/2016 1044   CL 103 11/12/2016 0900   CO2 31 (H) 12/10/2016 1044   BUN 10.7 12/10/2016 1044   CREATININE 1.5 (H) 12/10/2016 1044   GLU 104 02/13/2016 1358      Component Value Date/Time   CALCIUM 10.2 12/10/2016 1044   ALKPHOS  79 12/10/2016 1044   AST 19 12/10/2016 1044   ALT 18 12/10/2016 1044   BILITOT <0.22 12/10/2016 1044       Lab Results  Component Value Date   LABCA2 11 04/20/2008    No components found for: [id number]  No results for input(s): INR in the last 168 hours.  Urinalysis    Component Value Date/Time   COLORURINE YELLOW 01/27/2016 1028   APPEARANCEUR CLEAR 01/27/2016 1028   LABSPEC 1.028 01/27/2016 1028   PHURINE 5.5 01/27/2016 1028   GLUCOSEU NEGATIVE 01/27/2016 1028   GLUCOSEU NEG mg/dL 95/62/1308 6578   HGBUR NEGATIVE 01/27/2016 1028   BILIRUBINUR NEGATIVE 01/27/2016 1028   KETONESUR NEGATIVE 01/27/2016 1028   PROTEINUR NEGATIVE 01/27/2016 1028   UROBILINOGEN 1 07/23/2006 2113   NITRITE NEGATIVE 01/27/2016 1028   LEUKOCYTESUR NEGATIVE 01/27/2016 1028     STUDIES: Ct Chest W Contrast  Result Date: 11/16/2016 CLINICAL DATA:  Recurrent right breast cancer. Mastectomy with nodal removal in May 2018 (drain still in place). EXAM: CT CHEST, ABDOMEN, AND PELVIS WITH CONTRAST TECHNIQUE: Multidetector CT imaging of the chest, abdomen and pelvis was performed following the standard protocol during bolus administration of intravenous contrast. CONTRAST:  ISOVUE-300 IOPAMIDOL (ISOVUE-300) INJECTION 61% COMPARISON:  Chest CT 04/28/2007.  Pelvic sonography 10/25/2009. FINDINGS: CT CHEST FINDINGS Cardiovascular: Right Port-A-Cath tip:  SVC. Mediastinum/Nodes: Right mastectomy and axillary dissection with postinflammatory findings and a right axillary drain still in place. Mild indistinctness of superficial fascia margins along the pectoralis musculature. On the left side there is a 0.9 cm subpectoral lymph node on image 15/2. Several small left axillary lymph nodes are present. No pathologic mediastinal or hilar adenopathy. Lungs/Pleura: Low-level stranding anteriorly in the right chest may  be from prior radiation therapy. A 4 mm nodule posteriorly in the right upper lobe on image 62/4 is  not readily apparent on the prior CT chest from 04/28/2007. Musculoskeletal: Thoracic spondylosis. CT ABDOMEN PELVIS FINDINGS Hepatobiliary: Dependent density in the gallbladder favoring small gallstones, images 68-69 series 2. Punctate likely postinflammatory calcification posteriorly along the right hepatic lobe capsule, image 50/2. Pancreas: Unremarkable Spleen: Unremarkable Adrenals/Urinary Tract: Unremarkable Stomach/Bowel: Scattered descending and sigmoid colon diverticula without active diverticulitis identified. Vascular/Lymphatic: 1.2 cm left external iliac lymph node on image 107/2. Reproductive: Mild nodularity along the left posterior uterine body probably from a small fibroid. The endometrium is estimated at up to 11 mm in thickness. Other: No supplemental non-categorized findings. Musculoskeletal: Lumbar spondylosis and degenerative disc disease with grade 1 degenerative anterolisthesis at L4-5 at L5-S1, and mild foraminal impingement at both of these levels. IMPRESSION: 1. Several findings warrant surveillance including a 9 mm left subpectoral lymph node and a 1.2 cm left external iliac lymph node, as well as a 4 mm nodule in the right upper lobe. Overall these findings are probably benign but warrants surveillance in this setting. Otherwise, no obvious/specific signs of active malignancy. 2. Recent right mastectomy. 3. Cholelithiasis. 4. Mild diverticulosis without active diverticulitis. 5. The endometrium is thickened at up to 11 mm. On prior sonographic workup in 2011, the patient had an even more thickened endometrium in the setting of postmenopausal bleeding, correlation without workup is recommended. The current endometrial thickness of 11 mm is considered abnormal, and pelvic sonography should be strongly considered. 6. Mild lower lumbar impingement related to spondylosis and degenerative disc disease. Electronically Signed   By: Gaylyn Rong M.D.   On: 11/16/2016 10:23   Ct Abdomen  Pelvis W Contrast  Result Date: 11/16/2016 CLINICAL DATA:  Recurrent right breast cancer. Mastectomy with nodal removal in May 2018 (drain still in place). EXAM: CT CHEST, ABDOMEN, AND PELVIS WITH CONTRAST TECHNIQUE: Multidetector CT imaging of the chest, abdomen and pelvis was performed following the standard protocol during bolus administration of intravenous contrast. CONTRAST:  ISOVUE-300 IOPAMIDOL (ISOVUE-300) INJECTION 61% COMPARISON:  Chest CT 04/28/2007.  Pelvic sonography 10/25/2009. FINDINGS: CT CHEST FINDINGS Cardiovascular: Right Port-A-Cath tip:  SVC. Mediastinum/Nodes: Right mastectomy and axillary dissection with postinflammatory findings and a right axillary drain still in place. Mild indistinctness of superficial fascia margins along the pectoralis musculature. On the left side there is a 0.9 cm subpectoral lymph node on image 15/2. Several small left axillary lymph nodes are present. No pathologic mediastinal or hilar adenopathy. Lungs/Pleura: Low-level stranding anteriorly in the right chest may be from prior radiation therapy. A 4 mm nodule posteriorly in the right upper lobe on image 62/4 is not readily apparent on the prior CT chest from 04/28/2007. Musculoskeletal: Thoracic spondylosis. CT ABDOMEN PELVIS FINDINGS Hepatobiliary: Dependent density in the gallbladder favoring small gallstones, images 68-69 series 2. Punctate likely postinflammatory calcification posteriorly along the right hepatic lobe capsule, image 50/2. Pancreas: Unremarkable Spleen: Unremarkable Adrenals/Urinary Tract: Unremarkable Stomach/Bowel: Scattered descending and sigmoid colon diverticula without active diverticulitis identified. Vascular/Lymphatic: 1.2 cm left external iliac lymph node on image 107/2. Reproductive: Mild nodularity along the left posterior uterine body probably from a small fibroid. The endometrium is estimated at up to 11 mm in thickness. Other: No supplemental non-categorized findings.  Musculoskeletal: Lumbar spondylosis and degenerative disc disease with grade 1 degenerative anterolisthesis at L4-5 at L5-S1, and mild foraminal impingement at both of these levels. IMPRESSION: 1. Several findings warrant surveillance including a 9 mm  left subpectoral lymph node and a 1.2 cm left external iliac lymph node, as well as a 4 mm nodule in the right upper lobe. Overall these findings are probably benign but warrants surveillance in this setting. Otherwise, no obvious/specific signs of active malignancy. 2. Recent right mastectomy. 3. Cholelithiasis. 4. Mild diverticulosis without active diverticulitis. 5. The endometrium is thickened at up to 11 mm. On prior sonographic workup in 2011, the patient had an even more thickened endometrium in the setting of postmenopausal bleeding, correlation without workup is recommended. The current endometrial thickness of 11 mm is considered abnormal, and pelvic sonography should be strongly considered. 6. Mild lower lumbar impingement related to spondylosis and degenerative disc disease. Electronically Signed   By: Gaylyn Rong M.D.   On: 11/16/2016 10:23    ELIGIBLE FOR AVAILABLE RESEARCH PROTOCOL: no  ASSESSMENT: 61 y.o. [place] woman   (1) status post right lumpectomy and sentinel lymph node sampling October 2005 for a 0.6 cm invasive ductal carcinoma involving one out of 2 sentinel lymph nodes sampled, grade 3, triple-negative, treated adjuvantly with doxorubicin and cyclophosphamide 4 followed by weekly paclitaxel 7, followed by adjuvant radiation  RECURRENT DISEASE: (2) status post right breast upper outer quadrant biopsy and right axillary lymph node biopsy 10/01/2016, both positive for a T2 N1, stage IIIB invasive ductal carcinoma, triple negative, with an MIB-1 of 50-70%  (3) status post right modified radical mastectomy 11/05/2016 showing a pT2 pN1, stage IIIB invasive ductal carcinoma, grade 3, triple negative, with negative  margins  (4) Not a candidate for radiation given prior history  (5) adjuvant chemotherapy will consist of carboplatin and gemcitabine given days 1 and 8 of each 21 day cycle, for 6 cycles, starting 11/20/2016  (5) HIV positivity  PLAN: Unfortunately Elaysia's total white cell count today is 2.2 and the neutrophils are 0.8. The lymphocytes are 1.2 which is good.  We are not going to be able to treat her today and were going to need to use growth factors if you're going to be able to keep her treatments on schedule. She will receive Neupogen today and tomorrow and the next day, hopefully we can treat her again a week from today and after that she will receive on Pro.  She has a good understanding of this plan and is agreeable to it.  Lowella Dell, MD   12/10/2016 11:26 AM Medical Oncology and Hematology [facility] [address] [place], [place] [address] Tel. [phone]    Fax. [phone]

## 2016-12-10 NOTE — Patient Instructions (Signed)
Tbo-Filgrastim injection What is this medicine? TBO-FILGRASTIM (T B O fil GRA stim) is a granulocyte colony-stimulating factor that stimulates the growth of neutrophils, a type of white blood cell important in the body's fight against infection. It is used to reduce the incidence of fever and infection in patients with certain types of cancer who are receiving chemotherapy that affects the bone marrow. This medicine may be used for other purposes; ask your health care provider or pharmacist if you have questions. COMMON BRAND NAME(S): Granix What should I tell my health care provider before I take this medicine? They need to know if you have any of these conditions: -bone scan or tests planned -kidney disease -sickle cell anemia -an unusual or allergic reaction to tbo-filgrastim, filgrastim, pegfilgrastim, other medicines, foods, dyes, or preservatives -pregnant or trying to get pregnant -breast-feeding How should I use this medicine? This medicine is for injection under the skin. If you get this medicine at home, you will be taught how to prepare and give this medicine. Refer to the Instructions for Use that come with your medication packaging. Use exactly as directed. Take your medicine at regular intervals. Do not take your medicine more often than directed. It is important that you put your used needles and syringes in a special sharps container. Do not put them in a trash can. If you do not have a sharps container, call your pharmacist or healthcare provider to get one. Talk to your pediatrician regarding the use of this medicine in children. Special care may be needed. Overdosage: If you think you have taken too much of this medicine contact a poison control center or emergency room at once. NOTE: This medicine is only for you. Do not share this medicine with others. What if I miss a dose? It is important not to miss your dose. Call your doctor or health care professional if you miss a  dose. What may interact with this medicine? This medicine may interact with the following medications: -medicines that may cause a release of neutrophils, such as lithium This list may not describe all possible interactions. Give your health care provider a list of all the medicines, herbs, non-prescription drugs, or dietary supplements you use. Also tell them if you smoke, drink alcohol, or use illegal drugs. Some items may interact with your medicine. What should I watch for while using this medicine? You may need blood work done while you are taking this medicine. What side effects may I notice from receiving this medicine? Side effects that you should report to your doctor or health care professional as soon as possible: -allergic reactions like skin rash, itching or hives, swelling of the face, lips, or tongue -blood in the urine -dark urine -dizziness -fast heartbeat -feeling faint -shortness of breath or breathing problems -signs and symptoms of infection like fever or chills; cough; or sore throat -signs and symptoms of kidney injury like trouble passing urine or change in the amount of urine -stomach or side pain, or pain at the shoulder -sweating -swelling of the legs, ankles, or abdomen -tiredness Side effects that usually do not require medical attention (report to your doctor or health care professional if they continue or are bothersome): -bone pain -headache -muscle pain -vomiting This list may not describe all possible side effects. Call your doctor for medical advice about side effects. You may report side effects to FDA at 1-800-FDA-1088. Where should I keep my medicine? Keep out of the reach of children. Store in a refrigerator between   2 and 8 degrees C (36 and 46 degrees F). Keep in carton to protect from light. Throw away this medicine if it is left out of the refrigerator for more than 5 consecutive days. Throw away any unused medicine after the expiration  date. NOTE: This sheet is a summary. It may not cover all possible information. If you have questions about this medicine, talk to your doctor, pharmacist, or health care provider.  2018 Elsevier/Gold Standard (2015-08-01 19:07:04)  

## 2016-12-11 ENCOUNTER — Ambulatory Visit (HOSPITAL_BASED_OUTPATIENT_CLINIC_OR_DEPARTMENT_OTHER): Payer: Medicare HMO

## 2016-12-11 VITALS — BP 132/74 | HR 89 | Temp 97.9°F | Resp 18

## 2016-12-11 DIAGNOSIS — C50411 Malignant neoplasm of upper-outer quadrant of right female breast: Secondary | ICD-10-CM | POA: Diagnosis not present

## 2016-12-11 DIAGNOSIS — D709 Neutropenia, unspecified: Secondary | ICD-10-CM

## 2016-12-11 DIAGNOSIS — Z171 Estrogen receptor negative status [ER-]: Principal | ICD-10-CM

## 2016-12-11 MED ORDER — TBO-FILGRASTIM 480 MCG/0.8ML ~~LOC~~ SOSY
480.0000 ug | PREFILLED_SYRINGE | Freq: Once | SUBCUTANEOUS | Status: AC
  Administered 2016-12-11: 480 ug via SUBCUTANEOUS
  Filled 2016-12-11: qty 0.8

## 2016-12-11 NOTE — Patient Instructions (Signed)
Tbo-Filgrastim injection What is this medicine? TBO-FILGRASTIM (T B O fil GRA stim) is a granulocyte colony-stimulating factor that stimulates the growth of neutrophils, a type of white blood cell important in the body's fight against infection. It is used to reduce the incidence of fever and infection in patients with certain types of cancer who are receiving chemotherapy that affects the bone marrow. This medicine may be used for other purposes; ask your health care provider or pharmacist if you have questions. COMMON BRAND NAME(S): Granix What should I tell my health care provider before I take this medicine? They need to know if you have any of these conditions: -bone scan or tests planned -kidney disease -sickle cell anemia -an unusual or allergic reaction to tbo-filgrastim, filgrastim, pegfilgrastim, other medicines, foods, dyes, or preservatives -pregnant or trying to get pregnant -breast-feeding How should I use this medicine? This medicine is for injection under the skin. If you get this medicine at home, you will be taught how to prepare and give this medicine. Refer to the Instructions for Use that come with your medication packaging. Use exactly as directed. Take your medicine at regular intervals. Do not take your medicine more often than directed. It is important that you put your used needles and syringes in a special sharps container. Do not put them in a trash can. If you do not have a sharps container, call your pharmacist or healthcare provider to get one. Talk to your pediatrician regarding the use of this medicine in children. Special care may be needed. Overdosage: If you think you have taken too much of this medicine contact a poison control center or emergency room at once. NOTE: This medicine is only for you. Do not share this medicine with others. What if I miss a dose? It is important not to miss your dose. Call your doctor or health care professional if you miss a  dose. What may interact with this medicine? This medicine may interact with the following medications: -medicines that may cause a release of neutrophils, such as lithium This list may not describe all possible interactions. Give your health care provider a list of all the medicines, herbs, non-prescription drugs, or dietary supplements you use. Also tell them if you smoke, drink alcohol, or use illegal drugs. Some items may interact with your medicine. What should I watch for while using this medicine? You may need blood work done while you are taking this medicine. What side effects may I notice from receiving this medicine? Side effects that you should report to your doctor or health care professional as soon as possible: -allergic reactions like skin rash, itching or hives, swelling of the face, lips, or tongue -blood in the urine -dark urine -dizziness -fast heartbeat -feeling faint -shortness of breath or breathing problems -signs and symptoms of infection like fever or chills; cough; or sore throat -signs and symptoms of kidney injury like trouble passing urine or change in the amount of urine -stomach or side pain, or pain at the shoulder -sweating -swelling of the legs, ankles, or abdomen -tiredness Side effects that usually do not require medical attention (report to your doctor or health care professional if they continue or are bothersome): -bone pain -headache -muscle pain -vomiting This list may not describe all possible side effects. Call your doctor for medical advice about side effects. You may report side effects to FDA at 1-800-FDA-1088. Where should I keep my medicine? Keep out of the reach of children. Store in a refrigerator between   2 and 8 degrees C (36 and 46 degrees F). Keep in carton to protect from light. Throw away this medicine if it is left out of the refrigerator for more than 5 consecutive days. Throw away any unused medicine after the expiration  date. NOTE: This sheet is a summary. It may not cover all possible information. If you have questions about this medicine, talk to your doctor, pharmacist, or health care provider.  2018 Elsevier/Gold Standard (2015-08-01 19:07:04)  

## 2016-12-12 ENCOUNTER — Ambulatory Visit (HOSPITAL_BASED_OUTPATIENT_CLINIC_OR_DEPARTMENT_OTHER): Payer: Medicare HMO

## 2016-12-12 VITALS — BP 110/63 | HR 88 | Temp 97.8°F | Resp 20

## 2016-12-12 DIAGNOSIS — C50411 Malignant neoplasm of upper-outer quadrant of right female breast: Secondary | ICD-10-CM

## 2016-12-12 DIAGNOSIS — D709 Neutropenia, unspecified: Secondary | ICD-10-CM

## 2016-12-12 DIAGNOSIS — Z171 Estrogen receptor negative status [ER-]: Principal | ICD-10-CM

## 2016-12-12 MED ORDER — TBO-FILGRASTIM 480 MCG/0.8ML ~~LOC~~ SOSY
480.0000 ug | PREFILLED_SYRINGE | Freq: Once | SUBCUTANEOUS | Status: AC
  Administered 2016-12-12: 480 ug via SUBCUTANEOUS
  Filled 2016-12-12: qty 0.8

## 2016-12-12 NOTE — Patient Instructions (Signed)
Tbo-Filgrastim injection What is this medicine? TBO-FILGRASTIM (T B O fil GRA stim) is a granulocyte colony-stimulating factor that stimulates the growth of neutrophils, a type of white blood cell important in the body's fight against infection. It is used to reduce the incidence of fever and infection in patients with certain types of cancer who are receiving chemotherapy that affects the bone marrow. This medicine may be used for other purposes; ask your health care provider or pharmacist if you have questions. COMMON BRAND NAME(S): Granix What should I tell my health care provider before I take this medicine? They need to know if you have any of these conditions: -bone scan or tests planned -kidney disease -sickle cell anemia -an unusual or allergic reaction to tbo-filgrastim, filgrastim, pegfilgrastim, other medicines, foods, dyes, or preservatives -pregnant or trying to get pregnant -breast-feeding How should I use this medicine? This medicine is for injection under the skin. If you get this medicine at home, you will be taught how to prepare and give this medicine. Refer to the Instructions for Use that come with your medication packaging. Use exactly as directed. Take your medicine at regular intervals. Do not take your medicine more often than directed. It is important that you put your used needles and syringes in a special sharps container. Do not put them in a trash can. If you do not have a sharps container, call your pharmacist or healthcare provider to get one. Talk to your pediatrician regarding the use of this medicine in children. Special care may be needed. Overdosage: If you think you have taken too much of this medicine contact a poison control center or emergency room at once. NOTE: This medicine is only for you. Do not share this medicine with others. What if I miss a dose? It is important not to miss your dose. Call your doctor or health care professional if you miss a  dose. What may interact with this medicine? This medicine may interact with the following medications: -medicines that may cause a release of neutrophils, such as lithium This list may not describe all possible interactions. Give your health care provider a list of all the medicines, herbs, non-prescription drugs, or dietary supplements you use. Also tell them if you smoke, drink alcohol, or use illegal drugs. Some items may interact with your medicine. What should I watch for while using this medicine? You may need blood work done while you are taking this medicine. What side effects may I notice from receiving this medicine? Side effects that you should report to your doctor or health care professional as soon as possible: -allergic reactions like skin rash, itching or hives, swelling of the face, lips, or tongue -blood in the urine -dark urine -dizziness -fast heartbeat -feeling faint -shortness of breath or breathing problems -signs and symptoms of infection like fever or chills; cough; or sore throat -signs and symptoms of kidney injury like trouble passing urine or change in the amount of urine -stomach or side pain, or pain at the shoulder -sweating -swelling of the legs, ankles, or abdomen -tiredness Side effects that usually do not require medical attention (report to your doctor or health care professional if they continue or are bothersome): -bone pain -headache -muscle pain -vomiting This list may not describe all possible side effects. Call your doctor for medical advice about side effects. You may report side effects to FDA at 1-800-FDA-1088. Where should I keep my medicine? Keep out of the reach of children. Store in a refrigerator between   2 and 8 degrees C (36 and 46 degrees F). Keep in carton to protect from light. Throw away this medicine if it is left out of the refrigerator for more than 5 consecutive days. Throw away any unused medicine after the expiration  date. NOTE: This sheet is a summary. It may not cover all possible information. If you have questions about this medicine, talk to your doctor, pharmacist, or health care provider.  2018 Elsevier/Gold Standard (2015-08-01 19:07:04)  

## 2016-12-14 MED FILL — JULUCA 50-25 MG TAB: 50-25 | 30 days supply | Qty: 30 | Fill #0

## 2016-12-14 MED FILL — PREZCOBIX 800 MG-150 MG TAB: 800-150 | 30 days supply | Qty: 30 | Fill #4

## 2016-12-17 ENCOUNTER — Ambulatory Visit (HOSPITAL_BASED_OUTPATIENT_CLINIC_OR_DEPARTMENT_OTHER): Payer: Medicare HMO | Admitting: Oncology

## 2016-12-17 ENCOUNTER — Ambulatory Visit (HOSPITAL_BASED_OUTPATIENT_CLINIC_OR_DEPARTMENT_OTHER): Payer: Medicare HMO

## 2016-12-17 ENCOUNTER — Other Ambulatory Visit (HOSPITAL_BASED_OUTPATIENT_CLINIC_OR_DEPARTMENT_OTHER): Payer: Medicare HMO

## 2016-12-17 ENCOUNTER — Other Ambulatory Visit: Payer: Self-pay | Admitting: Oncology

## 2016-12-17 VITALS — BP 135/86 | HR 78 | Temp 97.9°F | Resp 18 | Ht 67.0 in | Wt 258.2 lb

## 2016-12-17 DIAGNOSIS — C50411 Malignant neoplasm of upper-outer quadrant of right female breast: Secondary | ICD-10-CM

## 2016-12-17 DIAGNOSIS — Z171 Estrogen receptor negative status [ER-]: Secondary | ICD-10-CM

## 2016-12-17 DIAGNOSIS — C50911 Malignant neoplasm of unspecified site of right female breast: Secondary | ICD-10-CM

## 2016-12-17 DIAGNOSIS — C773 Secondary and unspecified malignant neoplasm of axilla and upper limb lymph nodes: Secondary | ICD-10-CM

## 2016-12-17 DIAGNOSIS — Z5111 Encounter for antineoplastic chemotherapy: Secondary | ICD-10-CM

## 2016-12-17 DIAGNOSIS — B20 Human immunodeficiency virus [HIV] disease: Secondary | ICD-10-CM | POA: Diagnosis not present

## 2016-12-17 LAB — COMPREHENSIVE METABOLIC PANEL
ALBUMIN: 3.2 g/dL — AB (ref 3.5–5.0)
ALK PHOS: 86 U/L (ref 40–150)
ALT: 17 U/L (ref 0–55)
AST: 20 U/L (ref 5–34)
Anion Gap: 8 mEq/L (ref 3–11)
BUN: 10.4 mg/dL (ref 7.0–26.0)
CALCIUM: 10.7 mg/dL — AB (ref 8.4–10.4)
CO2: 29 mEq/L (ref 22–29)
Chloride: 106 mEq/L (ref 98–109)
Creatinine: 1.4 mg/dL — ABNORMAL HIGH (ref 0.6–1.1)
EGFR: 45 mL/min/{1.73_m2} — AB (ref >90)
GLUCOSE: 114 mg/dL (ref 70–140)
POTASSIUM: 3.9 meq/L (ref 3.5–5.1)
SODIUM: 144 meq/L (ref 136–145)
Total Bilirubin: <0.22 mg/dL (ref 0.20–1.20)
Total Protein: 7.3 g/dL (ref 6.4–8.3)

## 2016-12-17 LAB — CBC WITH DIFFERENTIAL/PLATELET
BASO%: 0.6 % (ref 0.0–2.0)
BASOS ABS: 0 10*3/uL (ref 0.0–0.1)
EOS ABS: 0.1 10*3/uL (ref 0.0–0.5)
EOS%: 0.9 % (ref 0.0–7.0)
HCT: 35.3 % (ref 34.8–46.6)
HEMOGLOBIN: 11.6 g/dL (ref 11.6–15.9)
LYMPH%: 38.9 % (ref 14.0–49.7)
MCH: 32.5 pg (ref 25.1–34.0)
MCHC: 32.9 g/dL (ref 31.5–36.0)
MCV: 98.9 fL (ref 79.5–101.0)
MONO#: 1.1 10*3/uL — ABNORMAL HIGH (ref 0.1–0.9)
MONO%: 16.7 % — AB (ref 0.0–14.0)
NEUT#: 2.9 10*3/uL (ref 1.5–6.5)
NEUT%: 42.9 % (ref 38.4–76.8)
Platelets: 276 10*3/uL (ref 145–400)
RBC: 3.57 10*6/uL — ABNORMAL LOW (ref 3.70–5.45)
RDW: 14.7 % — ABNORMAL HIGH (ref 11.2–14.5)
WBC: 6.8 10*3/uL (ref 3.9–10.3)
lymph#: 2.7 10*3/uL (ref 0.9–3.3)

## 2016-12-17 MED ORDER — SODIUM CHLORIDE 0.9 % IV SOLN
2400.0000 mg | Freq: Once | INTRAVENOUS | Status: AC
  Administered 2016-12-17: 2400 mg via INTRAVENOUS
  Filled 2016-12-17: qty 63.12

## 2016-12-17 MED ORDER — SODIUM CHLORIDE 0.9 % IV SOLN
211.2000 mg | Freq: Once | INTRAVENOUS | Status: AC
  Administered 2016-12-17: 210 mg via INTRAVENOUS
  Filled 2016-12-17: qty 21

## 2016-12-17 MED ORDER — PALONOSETRON HCL INJECTION 0.25 MG/5ML
INTRAVENOUS | Status: AC
  Filled 2016-12-17: qty 5

## 2016-12-17 MED ORDER — SODIUM CHLORIDE 0.9% FLUSH
10.0000 mL | INTRAVENOUS | Status: DC | PRN
  Administered 2016-12-17: 10 mL
  Filled 2016-12-17: qty 10

## 2016-12-17 MED ORDER — DEXAMETHASONE SODIUM PHOSPHATE 10 MG/ML IJ SOLN
10.0000 mg | Freq: Once | INTRAMUSCULAR | Status: AC
  Administered 2016-12-17: 10 mg via INTRAVENOUS

## 2016-12-17 MED ORDER — HEPARIN SOD (PORK) LOCK FLUSH 100 UNIT/ML IV SOLN
500.0000 [IU] | Freq: Once | INTRAVENOUS | Status: AC | PRN
  Administered 2016-12-17: 500 [IU]
  Filled 2016-12-17: qty 5

## 2016-12-17 MED ORDER — PEGFILGRASTIM 6 MG/0.6ML ~~LOC~~ PSKT: 6.0000 mg | PREFILLED_SYRINGE | Freq: Once | SUBCUTANEOUS | Status: DC

## 2016-12-17 MED ORDER — PALONOSETRON HCL INJECTION 0.25 MG/5ML
0.2500 mg | Freq: Once | INTRAVENOUS | Status: AC
  Administered 2016-12-17: 0.25 mg via INTRAVENOUS

## 2016-12-17 MED ORDER — DEXAMETHASONE SODIUM PHOSPHATE 10 MG/ML IJ SOLN
INTRAMUSCULAR | Status: AC
  Filled 2016-12-17: qty 1

## 2016-12-17 MED ORDER — SODIUM CHLORIDE 0.9 % IV SOLN
Freq: Once | INTRAVENOUS | Status: AC
  Administered 2016-12-17: 13:00:00 via INTRAVENOUS

## 2016-12-17 NOTE — Progress Notes (Signed)
[facility]  Telephone:([phone] Fax:([phone]) [phone]     ID: Kayla Price DOB: 01-Apr-1956  MR#: [id number]  JYN#:[id number]  Patient Care Team: Sharon Seller, NP as PCP - General (Nurse Practitioner) Daiva Eves, Lisette Grinder, MD as PCP - Infectious Diseases (Infectious Diseases) Ernesto Rutherford, MD as Consulting Physician (Ophthalmology) Romero Belling, MD as Consulting Physician (Endocrinology) Gean Birchwood, MD as Consulting Physician (Orthopedic Surgery) Bahja Bence, Valentino Hue, MD as Consulting Physician (Oncology) Claud Kelp, MD as Consulting Physician (General Surgery) Lowella Dell, MD OTHER MD:  CHIEF COMPLAINT: Triple negative breast cancer, recurrent  CURRENT TREATMENT: Adjuvant chemotherapy  INTERVAL HISTORY: Kayla Price returns today for evaluation and treatment of her triple negative breast cancer, which is recurrent. Today is day 8 cycle 2 of 6 planned cycles of carboplatin and gemcitabine received on days 1 and 8 of each 21 day cycle. She was unable to receive the day 1 treatment of this cycle because of low counts. Accordingly she received Neupogen for 3 days. She tolerated that well, with a little "pain in the bones", which she describes as mild. This particularly affected her left lower leg which she has had prior surgery. She had hydrocodone on hand and took some of that which resolved the problem.   REVIEW OF SYSTEMS: A detailed review of systems today was otherwise stable  BREAST CANCER HISTORY: From the original intake note:  [name] is a history of right-sided breast cancer dating back to 2005. She had a lumpectomy with sentinel lymph node sampling, chemotherapy, and radiation. I do not have access to those records at present.  More recently she [facility] showing a possible mass in the right breast. Diagnostic mammography with ultrasonography on 09/28/2016 the patient underwent  right diagnostic mammography with tomography and right breast ultrasonography. The breast density was category A. In the right breast at the 10:00 position there was an irregular mass measuring 2.5 cm. Ultrasound identified this the 10:00 radiant 10 cm from the nipple measuring 2.4 cm. In the right axilla there was an abnormal lymph node measuring 1.3 cm with other normal-appearing lymph nodes.  On 10/01/2016 she  underwent biopsy of the right breast mass in question as well as the suspicious axillary lymph node. 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 The proliferation marker was 70% in the breast lesion and 50% in the lymph node.  Her subsequent history is as detailed below.   PAST MEDICAL HISTORY: Past Medical History:  Diagnosis Date  . Alopecia areata 11/28/2009  . Bell's palsy   . BREAST CANCER, HX OF 2005  . Cancer [facility]) 2005   Breast cancer   chemotherapy and radiation  . CKD (chronic kidney disease) stage 3, GFR 30-59 ml/min 06/08/2015  . Dry eye syndrome   . Fasting hyperglycemia   . HIP FRACTURE, RIGHT 05/06/2008  . HIV (human immunodeficiency virus infection) (HCC)   . HIV DISEASE 03/27/2006  . HYPERLIPIDEMIA, MIXED 12/15/2007  . HYPERTENSION 03/27/2006  . 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 MENORRHAGIA, POSTMENOPAUSAL 02/03/2009  . Osteoarthritis of left knee 06/08/2015  . OSTEOARTHROSIS, LOCAL, SCND, UNSPC SITE 04/07/2007  . PVD 04/07/2007  . Tinea capitis   . TRIGGER FINGER 05/06/2008  . Unspecified vitamin D deficiency 08/06/2007    PAST SURGICAL HISTORY: Past Surgical History:  Procedure Laterality Date  . BREAST LUMPECTOMY Right  2005  . COLONOSCOPY    . HYSTEROSCOPY  2006  . KNEE ARTHROSCOPY Left 2002  . LYMPH NODE DISSECTION  2005  . MASTECTOMY MODIFIED RADICAL Right 11/05/2016   Procedure: RIGHT  MASTECTOMY MODIFIED RADICAL;  Surgeon: Claud Kelp, MD;  Location: [facility] OR;  Service: General;  Laterality: Right;  . MODIFIED RADICAL MASTECTOMY Right 11/05/2016  . placement   of port-a-cath    . PORT-A-CATH REMOVAL  2006   insertion 2005  . PORTACATH PLACEMENT Right 11/05/2016   Procedure: INSERTION PORT-A-CATH WITH ULTRA SOUND;  Surgeon: Claud Kelp, MD;  Location: [facility] OR;  Service: General;  Laterality: Right;  . removal of port a cath  12/2014  . THYROID SURGERY  2009   Ablation   . TOTAL KNEE ARTHROPLASTY Left 02/06/2016   Procedure: TOTAL KNEE ARTHROPLASTY;  Surgeon: Gean Birchwood, MD;  Location: [facility] OR;  Service: Orthopedics;  Laterality: Left;  . TUBAL LIGATION      FAMILY HISTORY Family History  Problem Relation Age of Onset  . Heart attack Brother        Massive MI in 38s  . Stroke Brother        CAD  . Kidney disease Mother   . Stroke Mother   . Diabetes Mother   . Liver disease Sister   . COPD Sister        had I-131 rx of hyperthyroidism  . Diabetes Sister   . Stroke Sister   . Cancer Paternal Uncle   . Cancer Cousin   . Heart failure Father   . Heart disease Father   . Arthritis Father   . Sarcoidosis Sister   The patient's father died at the age of 43 in the setting of Alzheimer's disease. The patient's mother died at the age of 66 from complications of diabetes. The patient has 3 brothers, 4 sisters. There is no history of breast or ovarian cancer in the family area   GYNECOLOGIC HISTORY:  Patient's last menstrual period was 11/06/2003.  menarche age 61, first live birth age 61, the patient is GX P2. She stopped having periods in 2005, with her chemotherapy; she never took hormone replacement   SOCIAL HISTORY:  Murdered work for the [employer] more than 20 years. 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 At home she lives with her son Kayla Price, her daughter Kayla Price who works for [employer] and her  granddaughter Kayla Price, 22 y/o AS OF APRIL 2018     ADVANCED DIRECTIVES:  not in place    HEALTH MAINTENANCE: Social History  Substance Use Topics  . Smoking status: Never Smoker  . Smokeless tobacco: Never Used  . Alcohol use No     Colonoscopy:  PAP:  Bone density:   Allergies  Allergen Reactions  . Lisinopril Anaphylaxis and Swelling    Swelling of tongue and mouth 11/05/16- tolerates Olmesartan  . Pepcid [Famotidine] Other (See Comments)    PPI H2, BLOCKERS LOWER GASTRIC PH WHICH WOULD LEAD TO SUBTHERAPEUTIC RILPIVIRINE LEVELS AND POTENTIAL VIROLOGICAL FAILURE WITH RESISTANCE  . Prilosec [Omeprazole] Other (See Comments)    PPI H2, BLOCKERS LOWER GASTRIC PH WHICH WOULD LEAD TO SUBTHERAPEUTIC RILPIVIRINE LEVELS AND POTENTIAL VIROLOGICAL FAILURE WITH RESISTANCE  . Tums [Calcium Carbonate Antacid] Other (See Comments)    TUMS ANTACIDS CAN LOWER GASTRICWHICH COULD  LEAD TO SUBTHERAPEUTIC RILPIVIRINE LEVELS AND POTENTIAL VIROLOGICAL FAILURE WITH RESISTANCE TUMS CAN BE GIVEN BUT NEED CONSULT WITH [facility] PHARMACY RE  TIMING. I PREFER HER TO AVOID ALL TOGETHER    Current Outpatient Prescriptions  Medication Sig Dispense Refill  . Cholecalciferol (VITAMIN D3) 2000 units TABS Take 2,000 Units by mouth daily with lunch.    . co-enzyme Q-10 30 MG capsule Take 1 tablet by mouth daily.    . darunavir-cobicistat (PREZCOBIX) 800-150 MG tablet TAKE 1 TABLET BY MOUTH DAILY. SWALLOW WHOLE. DO NOT CRUSH, BREAK OR CHEW TABLETS. TAKE WITH FOOD. (Patient taking differently: Take 1 tablet by mouth every evening. TAKE 1 TABLET BY MOUTH DAILY. SWALLOW WHOLE. DO NOT CRUSH, BREAK OR CHEW TABLETS. TAKE WITH FOOD.) 30 tablet 11  . dexamethasone (DECADRON) 4 MG tablet Take 2 tablets (8 mg total) by mouth daily. Start the day after chemotherapy for 2 days. Take with food. 30 tablet 1  . Dolutegravir-Rilpivirine (JULUCA) 50-25 MG TABS Take 1 tablet by mouth daily with breakfast. Take with the Prezcobix. 30 tablet 9    . gabapentin (NEURONTIN) 300 MG capsule Take 1 capsule (300 mg total) by mouth 4 (four) times daily. 120 capsule 4  . HYDROcodone-acetaminophen (NORCO/VICODIN) 5-325 MG tablet Take 1 tablet by mouth every 6 (six) hours as needed for moderate pain. 120 tablet 0  . levothyroxine (SYNTHROID, LEVOTHROID) 175 MCG tablet Take 1 tablet (175 mcg total) by mouth daily before breakfast. 90 tablet 1  . lidocaine-prilocaine (EMLA) cream Apply to affected area once 30 g 3  . loratadine (CLARITIN) 10 MG tablet Take 10 mg by mouth daily as needed for itching. Reported on 06/08/2015    . LORazepam (ATIVAN) 0.5 MG tablet Take 1 tablet (0.5 mg total) by mouth at bedtime as needed (Nausea or vomiting). 30 tablet 0  . methocarbamol (ROBAXIN) 500 MG tablet Take 500 mg by mouth every 8 (eight) hours as needed for muscle spasms.    Marland Kitchen olmesartan-hydrochlorothiazide (BENICAR HCT) 40-25 MG tablet TAKE 1 TABLET EVERY DAY (Patient taking differently: TAKE 1 TABLET EVERY DAY WITH LUNCH) 90 tablet 1  . Omega-3 Fatty Acids (FISH OIL PO) Take 1 capsule by mouth daily with lunch.    . Pitavastatin Calcium 4 MG TABS Take 1 tablet (4 mg total) by mouth daily. This may be placebo, study provided. Do not dispense. (Patient taking differently: Take 4 mg by mouth daily with lunch. This may be placebo, study provided. Do not dispense.) 30 tablet 11  . Probiotic Product (PROBIOTIC PO) Take 1 capsule by mouth daily with lunch.    . prochlorperazine (COMPAZINE) 10 MG tablet Take 1 tablet (10 mg total) by mouth every 6 (six) hours as needed (Nausea or vomiting). 30 tablet 1   No current facility-administered medications for this visit.     OBJECTIVE: Middle-aged African-American woman In no acute distress  There were no vitals filed for this visit.   There is no height or weight on file to calculate BMI.        ECOG FS:1 - Symptomatic but completely ambulatory  Sclerae unicteric, EOMs intact Oropharynx clear and moist No cervical  or supraclavicular adenopathy Lungs no rales or rhonchi Heart regular rate and rhythm Abd soft, nontender, positive bowel sounds MSK no focal spinal tenderness, no upper extremity lymphedema Neuro: nonfocal, well oriented, appropriate affect Breasts: Deferred    LAB RESULTS:  CMP     Component Value Date/Time   NA 141 12/10/2016 1044   K 3.7 12/10/2016 1044   CL 103 11/12/2016 0900   CO2 31 (H) 12/10/2016 1044   GLUCOSE 102 12/10/2016 1044  BUN 10.7 12/10/2016 1044   CREATININE 1.5 (H) 12/10/2016 1044   CALCIUM 10.2 12/10/2016 1044   PROT 6.9 12/10/2016 1044   ALBUMIN 3.1 (L) 12/10/2016 1044   AST 19 12/10/2016 1044   ALT 18 12/10/2016 1044   ALKPHOS 79 12/10/2016 1044   BILITOT <0.22 12/10/2016 1044   GFRNONAA 43 (L) 11/06/2016 0333   GFRNONAA 42 (L) 09/18/2016 0834   GFRAA 50 (L) 11/06/2016 0333   GFRAA 48 (L) 09/18/2016 0834    No results found for: TOTALPROTELP, ALBUMINELP, A1GS, A2GS, BETS, BETA2SER, GAMS, MSPIKE, SPEI  No results found for: Ron Parker, KAPLAMBRATIO  Lab Results  Component Value Date   WBC 2.2 (L) 12/10/2016   NEUTROABS 0.8 (L) 12/10/2016   HGB 11.2 (L) 12/10/2016   HCT 33.5 (L) 12/10/2016   MCV 96.5 12/10/2016   PLT 264 12/10/2016      Chemistry      Component Value Date/Time   NA 141 12/10/2016 1044   K 3.7 12/10/2016 1044   CL 103 11/12/2016 0900   CO2 31 (H) 12/10/2016 1044   BUN 10.7 12/10/2016 1044   CREATININE 1.5 (H) 12/10/2016 1044   GLU 104 02/13/2016 1358      Component Value Date/Time   CALCIUM 10.2 12/10/2016 1044   ALKPHOS 79 12/10/2016 1044   AST 19 12/10/2016 1044   ALT 18 12/10/2016 1044   BILITOT <0.22 12/10/2016 1044       Lab Results  Component Value Date   LABCA2 11 04/20/2008    No components found for: [id number]  No results for input(s): INR in the last 168 hours.  Urinalysis    Component Value Date/Time   COLORURINE YELLOW 01/27/2016 1028   APPEARANCEUR CLEAR 01/27/2016 1028    LABSPEC 1.028 01/27/2016 1028   PHURINE 5.5 01/27/2016 1028   GLUCOSEU NEGATIVE 01/27/2016 1028   GLUCOSEU NEG mg/dL 59/56/3875 6433   HGBUR NEGATIVE 01/27/2016 1028   BILIRUBINUR NEGATIVE 01/27/2016 1028   KETONESUR NEGATIVE 01/27/2016 1028   PROTEINUR NEGATIVE 01/27/2016 1028   UROBILINOGEN 1 07/23/2006 2113   NITRITE NEGATIVE 01/27/2016 1028   LEUKOCYTESUR NEGATIVE 01/27/2016 1028     STUDIES: She had questions about her CT scans of the chest abdomen and pelvis obtained 11/16/2016 and we reviewed those again today  ELIGIBLE FOR AVAILABLE RESEARCH PROTOCOL: no  ASSESSMENT: 61 y.o. [place] woman   (1) status post right lumpectomy and sentinel lymph node sampling October 2005 for a 0.6 cm invasive ductal carcinoma involving one out of 2 sentinel lymph nodes sampled, grade 3, triple-negative, treated adjuvantly with doxorubicin and cyclophosphamide 4 followed by weekly paclitaxel 7, followed by adjuvant radiation  RECURRENT DISEASE: (2) status post right breast upper outer quadrant biopsy and right axillary lymph node biopsy 10/01/2016, both positive for a T2 N1, stage IIIB invasive ductal carcinoma, triple negative, with an MIB-1 of 50-70%  (3) status post right modified radical mastectomy 11/05/2016 showing a pT2 pN1, stage IIIB invasive ductal carcinoma, grade 3, triple negative, with negative margins  (4) Not a candidate for radiation given prior history  (5) adjuvant chemotherapy will consist of carboplatin and gemcitabine given days 1 and 8 of each 21 day cycle, for 6 cycles, starting 11/20/2016  (a) day 1 cycle 2 omitted because of neutropenia; Neupogen/Neulasta added  (5) HIV positivity  PLAN: Jinna did well with the Neupogen shots and they have corrected her neutropenia so we can proceed to treatment today.  Basically she missed the day 1 of cycle 2  and she is receiving day 8 treatment today. She will not be treated for the next 2 weeks and accordingly she may  receive Neulasta. I have entered on Pro for her and discussed with her how it operates.   She will need to have Neupogen 3 days after the first dose of each cycle and of course on Pro after the day 8 dose. This will allow [place] to keep her treatments on time. I am hoping to be able to get her through 6 treatments but for at least will be the minimum.  She knows to call for any problems that may develop before her next visit here.   Lowella Dell, MD   12/17/2016 8:43 AM Medical Oncology and Hematology [facility] [address] [place], [place] [address] Tel. [phone]    Fax. [phone]

## 2016-12-17 NOTE — Patient Instructions (Signed)
[facility] Discharge Instructions for Patients Receiving Chemotherapy  Today you received the following chemotherapy agents: Gemzar and Carboplatin   To help prevent nausea and vomiting after your treatment, we encourage you to take your nausea medication as directed.    If you develop nausea and vomiting that is not controlled by your nausea medication, call the clinic.   BELOW ARE SYMPTOMS THAT SHOULD BE REPORTED IMMEDIATELY:  *FEVER GREATER THAN 100.5 F  *CHILLS WITH OR WITHOUT FEVER  NAUSEA AND VOMITING THAT IS NOT CONTROLLED WITH YOUR NAUSEA MEDICATION  *UNUSUAL SHORTNESS OF BREATH  *UNUSUAL BRUISING OR BLEEDING  TENDERNESS IN MOUTH AND THROAT WITH OR WITHOUT PRESENCE OF ULCERS  *URINARY PROBLEMS  *BOWEL PROBLEMS  UNUSUAL RASH Items with * indicate a potential emergency and should be followed up as soon as possible.  Feel free to call the clinic you have any questions or concerns. The clinic phone number is [phone].  Please show the [facility] at check-in to the Emergency Department and triage nurse.

## 2016-12-17 NOTE — Progress Notes (Signed)
Pt to return 12/18/16 at 4 pm for Neulasta injection. Pt aware, and verbalizes understanding.

## 2016-12-17 NOTE — Progress Notes (Signed)
Gus can we add a CD4 count to her next CBC. I like to put my HIV folks back on PCP prophylaxisi if their CD4 lymphocytes drop below 200

## 2016-12-17 NOTE — Addendum Note (Signed)
Addended by: Henreitta Leber E on: 12/17/2016 04:11 PM   Modules accepted: Orders

## 2016-12-18 ENCOUNTER — Ambulatory Visit: Payer: Medicare HMO | Attending: Oncology | Admitting: Physical Therapy

## 2016-12-18 ENCOUNTER — Ambulatory Visit (HOSPITAL_BASED_OUTPATIENT_CLINIC_OR_DEPARTMENT_OTHER): Payer: Medicare HMO

## 2016-12-18 VITALS — BP 122/81 | HR 70 | Resp 20

## 2016-12-18 DIAGNOSIS — C773 Secondary and unspecified malignant neoplasm of axilla and upper limb lymph nodes: Secondary | ICD-10-CM

## 2016-12-18 DIAGNOSIS — Z171 Estrogen receptor negative status [ER-]: Secondary | ICD-10-CM

## 2016-12-18 DIAGNOSIS — Z5189 Encounter for other specified aftercare: Secondary | ICD-10-CM

## 2016-12-18 DIAGNOSIS — R600 Localized edema: Secondary | ICD-10-CM | POA: Insufficient documentation

## 2016-12-18 DIAGNOSIS — M25611 Stiffness of right shoulder, not elsewhere classified: Secondary | ICD-10-CM | POA: Insufficient documentation

## 2016-12-18 DIAGNOSIS — M6281 Muscle weakness (generalized): Secondary | ICD-10-CM | POA: Insufficient documentation

## 2016-12-18 DIAGNOSIS — M79601 Pain in right arm: Secondary | ICD-10-CM | POA: Insufficient documentation

## 2016-12-18 DIAGNOSIS — C50411 Malignant neoplasm of upper-outer quadrant of right female breast: Secondary | ICD-10-CM | POA: Diagnosis not present

## 2016-12-18 DIAGNOSIS — C50911 Malignant neoplasm of unspecified site of right female breast: Secondary | ICD-10-CM

## 2016-12-18 MED ORDER — FILGRASTIM 480 MCG/0.8ML IJ SOSY: 480.0000 ug | PREFILLED_SYRINGE | Freq: Once | INTRAMUSCULAR | Status: DC

## 2016-12-18 MED ORDER — PEGFILGRASTIM INJECTION 6 MG/0.6ML ~~LOC~~
6.0000 mg | PREFILLED_SYRINGE | Freq: Once | SUBCUTANEOUS | Status: AC
  Administered 2016-12-18: 6 mg via SUBCUTANEOUS
  Filled 2016-12-18: qty 0.6

## 2016-12-18 NOTE — Patient Instructions (Signed)
Pegfilgrastim injection What is this medicine? PEGFILGRASTIM (PEG fil gra stim) is a long-acting granulocyte colony-stimulating factor that stimulates the growth of neutrophils, a type of white blood cell important in the body's fight against infection. It is used to reduce the incidence of fever and infection in patients with certain types of cancer who are receiving chemotherapy that affects the bone marrow, and to increase survival after being exposed to high doses of radiation. This medicine may be used for other purposes; ask your health care provider or pharmacist if you have questions. COMMON BRAND NAME(S): Neulasta What should I tell my health care provider before I take this medicine? They need to know if you have any of these conditions: -kidney disease -latex allergy -ongoing radiation therapy -sickle cell disease -skin reactions to acrylic adhesives (On-Body Injector only) -an unusual or allergic reaction to pegfilgrastim, filgrastim, other medicines, foods, dyes, or preservatives -pregnant or trying to get pregnant -breast-feeding How should I use this medicine? This medicine is for injection under the skin. If you get this medicine at home, you will be taught how to prepare and give the pre-filled syringe or how to use the On-body Injector. Refer to the patient Instructions for Use for detailed instructions. Use exactly as directed. Tell your healthcare provider immediately if you suspect that the On-body Injector may not have performed as intended or if you suspect the use of the On-body Injector resulted in a missed or partial dose. It is important that you put your used needles and syringes in a special sharps container. Do not put them in a trash can. If you do not have a sharps container, call your pharmacist or healthcare provider to get one. Talk to your pediatrician regarding the use of this medicine in children. While this drug may be prescribed for selected conditions,  precautions do apply. Overdosage: If you think you have taken too much of this medicine contact a poison control center or emergency room at once. NOTE: This medicine is only for you. Do not share this medicine with others. What if I miss a dose? It is important not to miss your dose. Call your doctor or health care professional if you miss your dose. If you miss a dose due to an On-body Injector failure or leakage, a new dose should be administered as soon as possible using a single prefilled syringe for manual use. What may interact with this medicine? Interactions have not been studied. Give your health care provider a list of all the medicines, herbs, non-prescription drugs, or dietary supplements you use. Also tell them if you smoke, drink alcohol, or use illegal drugs. Some items may interact with your medicine. This list may not describe all possible interactions. Give your health care provider a list of all the medicines, herbs, non-prescription drugs, or dietary supplements you use. Also tell them if you smoke, drink alcohol, or use illegal drugs. Some items may interact with your medicine. What should I watch for while using this medicine? You may need blood work done while you are taking this medicine. If you are going to need a MRI, CT scan, or other procedure, tell your doctor that you are using this medicine (On-Body Injector only). What side effects may I notice from receiving this medicine? Side effects that you should report to your doctor or health care professional as soon as possible: -allergic reactions like skin rash, itching or hives, swelling of the face, lips, or tongue -dizziness -fever -pain, redness, or irritation at site   where injected -pinpoint red spots on the skin -red or dark-brown urine -shortness of breath or breathing problems -stomach or side pain, or pain at the shoulder -swelling -tiredness -trouble passing urine or change in the amount of urine Side  effects that usually do not require medical attention (report to your doctor or health care professional if they continue or are bothersome): -bone pain -muscle pain This list may not describe all possible side effects. Call your doctor for medical advice about side effects. You may report side effects to FDA at 1-800-FDA-1088. Where should I keep my medicine? Keep out of the reach of children. Store pre-filled syringes in a refrigerator between 2 and 8 degrees C (36 and 46 degrees F). Do not freeze. Keep in carton to protect from light. Throw away this medicine if it is left out of the refrigerator for more than 48 hours. Throw away any unused medicine after the expiration date. NOTE: This sheet is a summary. It may not cover all possible information. If you have questions about this medicine, talk to your doctor, pharmacist, or health care provider.  2018 Elsevier/Gold Standard (2016-06-07 12:58:03)  

## 2016-12-20 ENCOUNTER — Ambulatory Visit: Payer: Medicare HMO | Admitting: Physical Therapy

## 2016-12-20 ENCOUNTER — Encounter: Payer: Self-pay | Admitting: Physical Therapy

## 2016-12-20 DIAGNOSIS — M25611 Stiffness of right shoulder, not elsewhere classified: Secondary | ICD-10-CM | POA: Diagnosis not present

## 2016-12-20 DIAGNOSIS — M79601 Pain in right arm: Secondary | ICD-10-CM

## 2016-12-20 DIAGNOSIS — R600 Localized edema: Secondary | ICD-10-CM

## 2016-12-20 DIAGNOSIS — M6281 Muscle weakness (generalized): Secondary | ICD-10-CM | POA: Diagnosis not present

## 2016-12-20 NOTE — Patient Instructions (Signed)

## 2016-12-20 NOTE — Therapy (Signed)
[place], [place], [address] Phone: [phone]   Fax:  [phone]  Physical Therapy Evaluation  Patient Details  Name: Kayla Price MRN: [id number] Date of Birth: [birth year] Referring Provider: Magrinat  Encounter Date: 12/20/2016      PT End of Session - 12/20/16 1703    Visit Number 1   Number of Visits 9   Date for PT Re-Evaluation 01/24/17   PT Start Time 1520   PT Stop Time 1600   PT Time Calculation (min) 40 min   Activity Tolerance Patient tolerated treatment well   Behavior During Therapy [facility] for tasks assessed/performed      Past Medical History:  Diagnosis Date  . Alopecia areata 11/28/2009  . Bell's palsy   . BREAST CANCER, HX OF 2005  . Cancer [facility]) 2005   Breast cancer   chemotherapy and radiation  . CKD (chronic kidney disease) stage 3, GFR 30-59 ml/min 06/08/2015  . Dry eye syndrome   . Fasting hyperglycemia   . HIP FRACTURE, RIGHT 05/06/2008  . HIV (human immunodeficiency virus infection) ([place])   . HIV DISEASE 03/27/2006  . HYPERLIPIDEMIA, MIXED 12/15/2007  . HYPERTENSION 03/27/2006  . 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 MENORRHAGIA, POSTMENOPAUSAL 02/03/2009  . Osteoarthritis of left knee 06/08/2015  . OSTEOARTHROSIS, LOCAL, SCND, UNSPC SITE 04/07/2007  . PVD 04/07/2007  . Tinea capitis   . TRIGGER FINGER 05/06/2008  . Unspecified vitamin D deficiency 08/06/2007    Past Surgical History:  Procedure Laterality Date  . BREAST LUMPECTOMY Right    2005  . COLONOSCOPY    . HYSTEROSCOPY  2006  . KNEE ARTHROSCOPY Left 2002  . LYMPH NODE DISSECTION  2005  . MASTECTOMY MODIFIED RADICAL Right 11/05/2016   Procedure: RIGHT MASTECTOMY MODIFIED RADICAL;  Surgeon: Fanny Skates, MD;  Location: [place];  Service: General;  Laterality: Right;  . MODIFIED RADICAL MASTECTOMY Right 11/05/2016  . placement   of port-a-cath    . PORT-A-CATH REMOVAL  2006   insertion 2005   . PORTACATH PLACEMENT Right 11/05/2016   Procedure: INSERTION PORT-A-CATH WITH ULTRA SOUND;  Surgeon: Fanny Skates, MD;  Location: [place];  Service: General;  Laterality: Right;  . removal of port a cath  12/2014  . THYROID SURGERY  2009   Ablation   . TOTAL KNEE ARTHROPLASTY Left 02/06/2016   Procedure: TOTAL KNEE ARTHROPLASTY;  Surgeon: Frederik Pear, MD;  Location: [place];  Service: Orthopedics;  Laterality: Left;  . TUBAL LIGATION      There were no vitals filed for this visit.       Subjective Assessment - 12/20/16 1527    Subjective I have had cancer in the right breast twice. I got a mastectomy on 11/05/16. The first cancer was diagnosed in September 2005. I had a lumpectomy in October and started chemo in November 2005. I finished radiation in July of 2006. I am doing chemotherapy again. In 2005 they removed 2 lymph nodes and this time they removed 10 lymph nodes. I am having this cording down my arm which is causing tightness.    Pertinent History R breast cancer in 2005 followed by lumpectomy, SLNB, chemo and radiation, R breast cancer recurrence in 2018 followed by mastectomy and ALND, and chemo, L TKA on 02/06/2016, HIV positive since 1993, arthritis   Patient Stated Goals to get my arm comfortable   Currently in Pain? No/denies            [facility]  PT Assessment - 12/20/16 0001      Assessment   Medical Diagnosis right breast cancer   Referring Provider Magrinat   Onset Date/Surgical Date 11/05/16   Hand Dominance Right   Prior Therapy none     Precautions   Precautions Other (comment)  at risk for lymphedema     Restrictions   Weight Bearing Restrictions No     Balance Screen   Has the patient fallen in the past 6 months No   Has the patient had a decrease in activity level because of a fear of falling?  No   Is the patient reluctant to leave their home because of a fear of falling?  No     Home Environment   Living Environment Private residence   Living  Arrangements Children   Available Help at Discharge Family   Type of [place] to enter   Entrance Stairs-Number of Steps 4   Entrance Stairs-Rails Can reach both   Home Layout Two level   Alternate Level Stairs-Number of Steps 14   Alternate Level Stairs-Rails Left     Prior Function   Level of Independence Independent   Vocation Retired   Leisure goes to the [employer] everyday- walks a mile, stationary bike 5 miles, tai chi, and silver sneakers 3x/wk, will start swimming once she gets a  new magazine     Cognition   Overall Cognitive Status Within Functional Limits for tasks assessed     ROM / Strength   AROM / PROM / Strength AROM;Strength     AROM   AROM Assessment Site Shoulder   Right/Left Shoulder Right;Left   Right Shoulder Flexion 162 Degrees   Right Shoulder ABduction 170 Degrees   Right Shoulder Internal Rotation 68 Degrees   Right Shoulder External Rotation 86 Degrees   Left Shoulder Flexion 170 Degrees   Left Shoulder ABduction 172 Degrees   Left Shoulder Internal Rotation 69 Degrees   Left Shoulder External Rotation 90 Degrees     Strength   Strength Assessment Site Shoulder  R shoulder grossly 3+/5, L 4/5           LYMPHEDEMA/ONCOLOGY QUESTIONNAIRE - 12/20/16 1547      Type   Cancer Type right breast cancer  2005 and 2018     Surgeries   Mastectomy Date 11/05/16   Lumpectomy Date 04/07/04   Sentinel Lymph Node Biopsy Date 04/07/04   Axillary Lymph Node Dissection Date 11/05/16   Number Lymph Nodes Removed 10  2 removed in 2005, 10 removed 2018     Treatment   Active Chemotherapy Treatment Yes   Past Chemotherapy Treatment Yes   Active Radiation Treatment No   Past Radiation Treatment Yes     What other symptoms do you have   Are you Having Heaviness or Tightness Yes   Are you having pitting edema No   Is it Hard or Difficult finding clothes that fit No   Do you have infections No   Is there Decreased scar mobility No      Lymphedema Assessments   Lymphedema Assessments Upper extremities     Right Upper Extremity Lymphedema   15 cm Proximal to Olecranon Process 38.2 cm   Olecranon Process 31.4 cm   15 cm Proximal to Ulnar Styloid Process 27.5 cm   Just Proximal to Ulnar Styloid Process 17 cm   Across Hand at [employer] 20.7 cm   At [place] of 2nd Digit  7 cm     Left Upper Extremity Lymphedema   15 cm Proximal to Olecranon Process 41.5 cm   Olecranon Process 29.6 cm   15 cm Proximal to Ulnar Styloid Process 29 cm   Just Proximal to Ulnar Styloid Process 16.5 cm   Across Hand at [employer] 21 cm   At [place] of 2nd Digit 7 cm           Quick Dash - 12/20/16 0001    Open a tight or new jar Mild difficulty   Do heavy household chores (wash walls, wash floors) No difficulty   Carry a shopping bag or briefcase No difficulty   Wash your back Mild difficulty   Use a knife to cut food No difficulty   Recreational activities in which you take some force or impact through your arm, shoulder, or hand (golf, hammering, tennis) No difficulty   During the past week, to what extent has your arm, shoulder or hand problem interfered with your normal social activities with family, friends, neighbors, or groups? Not at all   During the past week, to what extent has your arm, shoulder or hand problem limited your work or other regular daily activities Not at all   Arm, shoulder, or hand pain. None   Tingling (pins and needles) in your arm, shoulder, or hand None   Difficulty Sleeping No difficulty   DASH Score 4.55 %      Objective measurements completed on examination: See above findings.                  PT Education - 12/20/16 1702    Education provided Yes   Education Details some lymphedema risk reduction practices including repetitive motion and exercise, axillary web syndrome   Person(s) Educated Patient   Methods Explanation;Handout   Comprehension Verbalized understanding                 Long Term Clinic Goals - 12/20/16 1713      CC Long Term Goal  #1   Title Pt will report at least a 75% improvement in cording in RUE to allow for improved comfort.   Time 4   Period Weeks   Status New     CC Long Term Goal  #2   Title Pt will be independent in a home exercise program for continued strengthening and stretching of R shoulder   Time 4   Period Weeks   Status New     CC Long Term Goal  #3   Title Pt will be able to independently verbalize lymphedema risk reduction practices   Time 4   Period Weeks   Status New     CC Long Term Goal  #4   Title Pt will obtain a compression sleeve to wear when exercising to help decrease risk of developing lymphedema.   Time 4   Period Weeks   Status New             Plan - 12/20/16 1704    Clinical Impression Statement Patient presents to PT for cording in RUE following treatment for R breast cancer. Pt was initially diagnosed with R breast cancer in 2005 and underwent a lumpectomy and completed chemo and radiation. She was diagnosed with right breast cancer again in 2018 and underwent a mastectomy in May 2018 and is undergoing chemotherapy. Circumferential meaurements were taken today and there is no sign of swelling at this point. Pt states she has some post  surgical swelling in right lateral trunk. Her ROM is [facility] but her right shoulder strength is 3+/5 compared to 4/5 on left. She is very motivated and has been exercising at the [facility] daily. She does have tightness and discomfort in her R UE secondary to cording that is visible and palpable from wrist to axilla. Numerous cords can be palpated. Pt would benefit from skilled PT services for education on lymphedema risk reduction practices, myofascial release to cording, gentle strengthening exercises for right shoulder. She would also benefit from a prophylactic sleeve especially during her exercises.    History and Personal Factors relevant to plan of care: pt  is right handed, pt has past history of breast cancer   Clinical Presentation Evolving   Clinical Presentation due to: pt receiving chemotherapy treatments   Clinical Decision Making Moderate   Rehab Potential Good   Clinical Impairments Affecting Rehab Potential pt undergoing chemo, past hx of radiation    PT Frequency 2x / week   PT Duration 4 weeks  will begin the week of 12/31/16   PT Treatment/Interventions ADLs/Self Care Home Management;Therapeutic exercise;Therapeutic activities;Patient/family education;Manual techniques;Manual lymph drainage;Scar mobilization;Passive range of motion;Taping   PT Next Visit Plan assist with prophylactic sleeve, give handout on lymphedema risk reduction, begin myofascial to cording in RUE   Consulted and Agree with Plan of Care Patient      Patient will benefit from skilled therapeutic intervention in order to improve the following deficits and impairments:  Increased fascial restricitons, Decreased knowledge of precautions, Increased edema, Decreased strength, Impaired UE functional use, Pain, Decreased scar mobility  Visit Diagnosis: Pain in right arm - Plan: PT plan of care cert/re-cert  Stiffness of right shoulder, not elsewhere classified - Plan: PT plan of care cert/re-cert  Muscle weakness (generalized) - Plan: PT plan of care cert/re-cert  Localized edema - Plan: PT plan of care cert/re-cert      G-Codes - 41/28/78 1713    Functional Assessment Tool Used (Outpatient Only) Quick Dash   Functional Limitation Carrying, moving and handling objects   Carrying, Moving and Handling Objects Current Status (M7672) At least 1 percent but less than 20 percent impaired, limited or restricted   Carrying, Moving and Handling Objects Goal Status (C9470) 0 percent impaired, limited or restricted       Problem List Patient Active Problem List   Diagnosis Date Noted  . Recurrent cancer of right breast ([place]) 11/05/2016  . Malignant neoplasm of  upper-outer quadrant of right breast in female, estrogen receptor negative ([place]) 10/09/2016  . Primary localized osteoarthritis of left knee 02/06/2016  . Primary osteoarthritis of left knee 02/05/2016  . Osteoarthritis of left knee 06/08/2015  . CKD (chronic kidney disease) stage 3, GFR 30-59 ml/min 06/08/2015  . Vitamin D deficiency 09/23/2014  . Acute renal insufficiency 04/23/2014  . 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 Bell's palsy 12/04/2012  . Pharyngitis 01/14/2012  . Contact dermatitis 08/29/2011  . Dry eyes 08/20/2011  . Dry mouth 08/20/2011  . Neuropathy 04/09/2011  . Insomnia 04/09/2011  . Obesities, morbid ([place]) 09/27/2010  . Endometrial polyp 12/22/2009  . ALOPECIA AREATA 11/28/2009  . ACUTE NASOPHARYNGITIS 07/18/2009  . MENORRHAGIA, POSTMENOPAUSAL 02/03/2009  . ABNORMAL GLANDULAR PAPANICOLAOU SMEAR OF CERVIX 11/04/2008  . BROKEN TOOTH, WITH COMPLICATION 96/28/3662  . ALOPECIA 05/06/2008  . TRIGGER FINGER 05/06/2008  . HIP FRACTURE, RIGHT 05/06/2008  . ARTHROSCOPY, LEFT KNEE, HX OF 02/03/2008  . HYPERLIPIDEMIA, MIXED 12/15/2007  . HAND PAIN, RIGHT 12/15/2007  . Other abnormal  glucose 12/15/2007  . UNSPECIFIED VITAMIN D DEFICIENCY 08/06/2007  . TINEA CAPITIS 08/04/2007  . CNTC DERMATITIS&OTH ECZEMA DUE OTH CHEM PRODUCTS 08/04/2007  . PVD 04/07/2007  . Secondary localized osteoarthrosis 04/07/2007  . Human immunodeficiency virus (HIV) disease ([place]) 03/27/2006  . Essential hypertension 03/27/2006  . BREAST CANCER, HX OF 03/27/2006    Allyson Sabal [facility] 12/20/2016, 5:17 PM  [place], [place], [address] Phone: [phone]   Fax:  [phone]  Name: INNOCENCE SCHLOTZHAUER MRN: [id number] Date of Birth: Feb 03, 1956  Manus Gunning, PT 12/20/16 5:17 PM

## 2016-12-25 ENCOUNTER — Encounter: Payer: Self-pay | Admitting: Family Medicine

## 2016-12-25 ENCOUNTER — Ambulatory Visit (INDEPENDENT_AMBULATORY_CARE_PROVIDER_SITE_OTHER): Payer: Medicare HMO | Admitting: Family Medicine

## 2016-12-25 ENCOUNTER — Other Ambulatory Visit (HOSPITAL_COMMUNITY)
Admission: RE | Admit: 2016-12-25 | Discharge: 2016-12-25 | Disposition: A | Discharge status: Home / Self Care | Payer: Medicare HMO | Source: Ambulatory Visit | Attending: Family Medicine | Admitting: Family Medicine

## 2016-12-25 VITALS — BP 115/70 | HR 75 | Resp 18 | Ht 67.0 in | Wt 254.0 lb

## 2016-12-25 DIAGNOSIS — Z124 Encounter for screening for malignant neoplasm of cervix: Secondary | ICD-10-CM | POA: Insufficient documentation

## 2016-12-25 DIAGNOSIS — N950 Postmenopausal bleeding: Secondary | ICD-10-CM | POA: Diagnosis not present

## 2016-12-25 DIAGNOSIS — R9389 Abnormal findings on diagnostic imaging of other specified body structures: Secondary | ICD-10-CM

## 2016-12-25 DIAGNOSIS — R938 Abnormal findings on diagnostic imaging of other specified body structures: Secondary | ICD-10-CM | POA: Diagnosis not present

## 2016-12-25 NOTE — Progress Notes (Signed)
   Subjective:    Patient ID: Kayla Price is a 61 y.o. female presenting with Gynecologic Exam  on 12/25/2016  HPI: Here today referred from Dr. Jana Hakim for thickened endometrium. Previously seen and had D and C with hysteroscopy and polypectomy for PMB in 2011. Thickened endometrial stripe noted on CT scan. No recent sonogram. No further bleeding. PCP is [name] at [facility]  Review of Systems  Constitutional: Negative for chills and fever.  Respiratory: Negative for shortness of breath.   Cardiovascular: Negative for chest pain.  Gastrointestinal: Negative for abdominal pain, nausea and vomiting.  Genitourinary: Negative for dysuria.  Skin: Negative for rash.      Objective:    BP 115/70   Pulse 75   Resp 18   Ht 5\' 7"  (1.702 m)   Wt 254 lb (115.2 kg)   LMP 11/06/2003   BMI 39.78 kg/m  Physical Exam  Constitutional: She is oriented to person, place, and time. She appears well-developed and well-nourished. No distress.  HENT:  Head: Normocephalic and atraumatic.  Eyes: No scleral icterus.  Neck: Neck supple.  Cardiovascular: Normal rate.   Pulmonary/Chest: Effort normal.  Abdominal: Soft.  Genitourinary:  Genitourinary Comments: BUS normal, vagina is pink and rugated, cervix is flattened against the vaginal wall, rest of exam limited by body habitus   Neurological: She is alert and oriented to person, place, and time.  Skin: Skin is warm and dry.  Psychiatric: She has a normal mood and affect.        Assessment & Plan:   Problem List Items Addressed This Visit      Unprioritized   MENORRHAGIA, POSTMENOPAUSAL    Mostly resolved but endometrium remains thickened. Check pelvic sono-she does not want sampling again today--will decide based on ultrasound       Other Visit Diagnoses    Screening for cervical cancer    -  Primary   Relevant Orders   Cytology - PAP   Thickened endometrium       Relevant Orders   US Transvaginal Non-OB        Total face-to-face time with patient: 20 minutes. Over 50% of encounter was spent on counseling and coordination of care.  Donnamae Jude 12/25/2016 11:32 AM

## 2016-12-25 NOTE — Patient Instructions (Signed)
Preventive Care 40-64 Years, Female Preventive care refers to lifestyle choices and visits with your health care provider that can promote health and wellness. What does preventive care include?  A yearly physical exam. This is also called an annual well check.  Dental exams once or twice a year.  Routine eye exams. Ask your health care provider how often you should have your eyes checked.  Personal lifestyle choices, including: ? Daily care of your teeth and gums. ? Regular physical activity. ? Eating a healthy diet. ? Avoiding tobacco and drug use. ? Limiting alcohol use. ? Practicing safe sex. ? Taking low-dose aspirin daily starting at age 58. ? Taking vitamin and mineral supplements as recommended by your health care provider. What happens during an annual well check? The services and screenings done by your health care provider during your annual well check will depend on your age, overall health, lifestyle risk factors, and family history of disease. Counseling Your health care provider may ask you questions about your:  Alcohol use.  Tobacco use.  Drug use.  Emotional well-being.  Home and relationship well-being.  Sexual activity.  Eating habits.  Work and work Statistician.  Method of birth control.  Menstrual cycle.  Pregnancy history.  Screening You may have the following tests or measurements:  Height, weight, and BMI.  Blood pressure.  Lipid and cholesterol levels. These may be checked every 5 years, or more frequently if you are over 81 years old.  Skin check.  Lung cancer screening. You may have this screening every year starting at age 78 if you have a 30-pack-year history of smoking and currently smoke or have quit within the past 15 years.  Fecal occult blood test (FOBT) of the stool. You may have this test every year starting at age 65.  Flexible sigmoidoscopy or colonoscopy. You may have a sigmoidoscopy every 5 years or a colonoscopy  every 10 years starting at age 30.  Hepatitis C blood test.  Hepatitis B blood test.  Sexually transmitted disease (STD) testing.  Diabetes screening. This is done by checking your blood sugar (glucose) after you have not eaten for a while (fasting). You may have this done every 1-3 years.  Mammogram. This may be done every 1-2 years. Talk to your health care provider about when you should start having regular mammograms. This may depend on whether you have a family history of breast cancer.  BRCA-related cancer screening. This may be done if you have a family history of breast, ovarian, tubal, or peritoneal cancers.  Pelvic exam and Pap test. This may be done every 3 years starting at age 80. Starting at age 36, this may be done every 5 years if you have a Pap test in combination with an HPV test.  Bone density scan. This is done to screen for osteoporosis. You may have this scan if you are at high risk for osteoporosis.  Discuss your test results, treatment options, and if necessary, the need for more tests with your health care provider. Vaccines Your health care provider may recommend certain vaccines, such as:  Influenza vaccine. This is recommended every year.  Tetanus, diphtheria, and acellular pertussis (Tdap, Td) vaccine. You may need a Td booster every 10 years.  Varicella vaccine. You may need this if you have not been vaccinated.  Zoster vaccine. You may need this after age 5.  Measles, mumps, and rubella (MMR) vaccine. You may need at least one dose of MMR if you were born in  1957 or later. You may also need a second dose.  Pneumococcal 13-valent conjugate (PCV13) vaccine. You may need this if you have certain conditions and were not previously vaccinated.  Pneumococcal polysaccharide (PPSV23) vaccine. You may need one or two doses if you smoke cigarettes or if you have certain conditions.  Meningococcal vaccine. You may need this if you have certain  conditions.  Hepatitis A vaccine. You may need this if you have certain conditions or if you travel or work in places where you may be exposed to hepatitis A.  Hepatitis B vaccine. You may need this if you have certain conditions or if you travel or work in places where you may be exposed to hepatitis B.  Haemophilus influenzae type b (Hib) vaccine. You may need this if you have certain conditions.  Talk to your health care provider about which screenings and vaccines you need and how often you need them. This information is not intended to replace advice given to you by your health care provider. Make sure you discuss any questions you have with your health care provider. Document Released: 07/08/2015 Document Revised: 02/29/2016 Document Reviewed: 04/12/2015 Elsevier Interactive Patient Education  2017 [employer].

## 2016-12-25 NOTE — Assessment & Plan Note (Signed)
Mostly resolved but endometrium remains thickened. Check pelvic sono-she does not want sampling again today--will decide based on ultrasound

## 2016-12-28 LAB — CYTOLOGY - PAP
DIAGNOSIS: NEGATIVE
HPV (WINDOPATH): NOT DETECTED

## 2016-12-31 ENCOUNTER — Other Ambulatory Visit (HOSPITAL_COMMUNITY)
Admission: RE | Admit: 2016-12-31 | Discharge: 2016-12-31 | Disposition: A | Discharge status: Home / Self Care | Payer: Medicare HMO | Source: Ambulatory Visit | Attending: Oncology | Admitting: Oncology

## 2016-12-31 ENCOUNTER — Other Ambulatory Visit: Payer: Self-pay

## 2016-12-31 ENCOUNTER — Encounter: Payer: Self-pay | Admitting: Adult Health

## 2016-12-31 ENCOUNTER — Ambulatory Visit (HOSPITAL_BASED_OUTPATIENT_CLINIC_OR_DEPARTMENT_OTHER): Payer: Medicare HMO | Admitting: Adult Health

## 2016-12-31 ENCOUNTER — Ambulatory Visit (HOSPITAL_BASED_OUTPATIENT_CLINIC_OR_DEPARTMENT_OTHER): Payer: Medicare HMO

## 2016-12-31 ENCOUNTER — Other Ambulatory Visit (HOSPITAL_BASED_OUTPATIENT_CLINIC_OR_DEPARTMENT_OTHER): Payer: Medicare HMO

## 2016-12-31 VITALS — BP 95/59 | HR 76 | Temp 98.1°F | Resp 18 | Ht 67.0 in | Wt 253.0 lb

## 2016-12-31 VITALS — BP 130/66 | HR 68

## 2016-12-31 DIAGNOSIS — C50911 Malignant neoplasm of unspecified site of right female breast: Secondary | ICD-10-CM

## 2016-12-31 DIAGNOSIS — Z171 Estrogen receptor negative status [ER-]: Secondary | ICD-10-CM

## 2016-12-31 DIAGNOSIS — C773 Secondary and unspecified malignant neoplasm of axilla and upper limb lymph nodes: Secondary | ICD-10-CM | POA: Diagnosis not present

## 2016-12-31 DIAGNOSIS — C50411 Malignant neoplasm of upper-outer quadrant of right female breast: Secondary | ICD-10-CM

## 2016-12-31 DIAGNOSIS — B20 Human immunodeficiency virus [HIV] disease: Secondary | ICD-10-CM

## 2016-12-31 DIAGNOSIS — Z5111 Encounter for antineoplastic chemotherapy: Secondary | ICD-10-CM

## 2016-12-31 LAB — COMPREHENSIVE METABOLIC PANEL
ALT: 17 U/L (ref 0–55)
AST: 13 U/L (ref 5–34)
Albumin: 3.6 g/dL (ref 3.5–5.0)
Alkaline Phosphatase: 112 U/L (ref 40–150)
Anion Gap: 12 mEq/L — ABNORMAL HIGH (ref 3–11)
BUN: 14 mg/dL (ref 7.0–26.0)
CALCIUM: 11.4 mg/dL — AB (ref 8.4–10.4)
CHLORIDE: 105 meq/L (ref 98–109)
CO2: 29 mEq/L (ref 22–29)
CREATININE: 1.5 mg/dL — AB (ref 0.6–1.1)
EGFR: 42 mL/min/{1.73_m2} — ABNORMAL LOW (ref >90)
GLUCOSE: 103 mg/dL (ref 70–140)
Potassium: 4.8 mEq/L (ref 3.5–5.1)
SODIUM: 146 meq/L — AB (ref 136–145)
Total Bilirubin: 0.3 mg/dL (ref 0.20–1.20)
Total Protein: 7.6 g/dL (ref 6.4–8.3)

## 2016-12-31 LAB — CBC WITH DIFFERENTIAL/PLATELET
BASO%: 0.2 % (ref 0.0–2.0)
Basophils Absolute: 0 10*3/uL (ref 0.0–0.1)
EOS%: 0.6 % (ref 0.0–7.0)
Eosinophils Absolute: 0.1 10*3/uL (ref 0.0–0.5)
HEMATOCRIT: 37.7 % (ref 34.8–46.6)
HEMOGLOBIN: 12.3 g/dL (ref 11.6–15.9)
LYMPH#: 2.2 10*3/uL (ref 0.9–3.3)
LYMPH%: 20.2 % (ref 14.0–49.7)
MCH: 32.3 pg (ref 25.1–34.0)
MCHC: 32.6 g/dL (ref 31.5–36.0)
MCV: 99 fL (ref 79.5–101.0)
MONO#: 1 10*3/uL — ABNORMAL HIGH (ref 0.1–0.9)
MONO%: 9 % (ref 0.0–14.0)
NEUT#: 7.6 10*3/uL — ABNORMAL HIGH (ref 1.5–6.5)
NEUT%: 70 % (ref 38.4–76.8)
Platelets: 148 10*3/uL (ref 145–400)
RBC: 3.81 10*6/uL (ref 3.70–5.45)
RDW: 16.3 % — AB (ref 11.2–14.5)
WBC: 10.9 10*3/uL — ABNORMAL HIGH (ref 3.9–10.3)

## 2016-12-31 MED ORDER — DEXAMETHASONE SODIUM PHOSPHATE 10 MG/ML IJ SOLN
10.0000 mg | Freq: Once | INTRAMUSCULAR | Status: AC
Start: 2016-12-31 — End: 2016-12-31
  Administered 2016-12-31: 10 mg via INTRAVENOUS

## 2016-12-31 MED ORDER — PALONOSETRON HCL INJECTION 0.25 MG/5ML
0.2500 mg | Freq: Once | INTRAVENOUS | Status: AC
  Administered 2016-12-31: 0.25 mg via INTRAVENOUS

## 2016-12-31 MED ORDER — GEMCITABINE HCL CHEMO INJECTION 1 GM/26.3ML
2400.0000 mg | Freq: Once | INTRAVENOUS | Status: AC
  Administered 2016-12-31: 2400 mg via INTRAVENOUS
  Filled 2016-12-31: qty 63.12

## 2016-12-31 MED ORDER — SODIUM CHLORIDE 0.9 % IV SOLN
Freq: Once | INTRAVENOUS | Status: AC
  Administered 2016-12-31: 13:00:00 via INTRAVENOUS

## 2016-12-31 MED ORDER — SODIUM CHLORIDE 0.9% FLUSH
10.0000 mL | INTRAVENOUS | Status: DC | PRN
  Administered 2016-12-31: 10 mL
  Filled 2016-12-31: qty 10

## 2016-12-31 MED ORDER — SODIUM CHLORIDE 0.9 % IV SOLN
200.4000 mg | Freq: Once | INTRAVENOUS | Status: AC
  Administered 2016-12-31: 200 mg via INTRAVENOUS
  Filled 2016-12-31: qty 20

## 2016-12-31 MED ORDER — HEPARIN SOD (PORK) LOCK FLUSH 100 UNIT/ML IV SOLN
500.0000 [IU] | Freq: Once | INTRAVENOUS | Status: AC | PRN
  Administered 2016-12-31: 500 [IU]
  Filled 2016-12-31: qty 5

## 2016-12-31 MED ORDER — HYDROCODONE-ACETAMINOPHEN 5-325 MG PO TABS: 1.0000 | ORAL_TABLET | Freq: Four times a day (QID) | ORAL | 0 refills | Status: DC | PRN

## 2016-12-31 MED ORDER — PALONOSETRON HCL INJECTION 0.25 MG/5ML
INTRAVENOUS | Status: AC
  Filled 2016-12-31: qty 5

## 2016-12-31 MED ORDER — DEXAMETHASONE SODIUM PHOSPHATE 10 MG/ML IJ SOLN
INTRAMUSCULAR | Status: AC
  Filled 2016-12-31: qty 1

## 2016-12-31 NOTE — Patient Instructions (Signed)
Kayla Price Discharge Instructions for Patients Receiving Chemotherapy  Today you received the following chemotherapy agents: Gemzar and Carboplatin   To help prevent nausea and vomiting after your treatment, we encourage you to take your nausea medication as directed.    If you develop nausea and vomiting that is not controlled by your nausea medication, call the clinic.   BELOW ARE SYMPTOMS THAT SHOULD BE REPORTED IMMEDIATELY:  *FEVER GREATER THAN 100.5 F  *CHILLS WITH OR WITHOUT FEVER  NAUSEA AND VOMITING THAT IS NOT CONTROLLED WITH YOUR NAUSEA MEDICATION  *UNUSUAL SHORTNESS OF BREATH  *UNUSUAL BRUISING OR BLEEDING  TENDERNESS IN MOUTH AND THROAT WITH OR WITHOUT PRESENCE OF ULCERS  *URINARY PROBLEMS  *BOWEL PROBLEMS  UNUSUAL RASH Items with * indicate a potential emergency and should be followed up as soon as possible.  Feel free to call the clinic you have any questions or concerns. The clinic phone number is [phone].  Please show the [place] at check-in to the Emergency Department and triage nurse.

## 2016-12-31 NOTE — Progress Notes (Signed)
Thanks so much for checking CD4. She can stop her ARB/diuretic combination. I will ask our triage RN pool to get in touch with Joycelyn Schmid and dc this given her soft bp now

## 2016-12-31 NOTE — Progress Notes (Signed)
[facility]  [phone] [phone]     ID: Kayla Price DOB: 04/12/1956  MR#: [id number]  OHY#:[id number]  Patient Care Team: Lauree Chandler, NP as PCP - General (Nurse Practitioner) Tommy Medal, Lavell Islam, MD as PCP - Infectious Diseases (Infectious Diseases) Clent Jacks, MD as Consulting Physician (Ophthalmology) Renato Shin, MD as Consulting Physician (Endocrinology) Frederik Pear, MD as Consulting Physician (Orthopedic Surgery) Magrinat, Virgie Dad, MD as Consulting Physician (Oncology) Fanny Skates, MD as Consulting Physician (General Surgery) Scot Dock, NP OTHER MD:  CHIEF COMPLAINT: Triple negative breast cancer, recurrent  CURRENT TREATMENT: Adjuvant chemotherapy  INTERVAL HISTORY: Kayla Price returns today for evaluation and treatment of her triple negative breast cancer, which is recurrent. Today is cycle 3 day 1 of adjuvant Gemcitabine and Carboplatin given on days 1 and 8 with Neupogen on days 2-4 and Onpro on Day 9.  She is doing well today.  She says that she has noted her blood pressure has been running lower than it previously was.  She says that she has lost a bit of weight and she wonders if Dr. Tommy Medal would decrease her anti-hypertensive.  She denies any dizziness/light headed ness.  She also denies any other issues such as fevers, chills, mucositis, or any further concern today.    REVIEW OF SYSTEMS: A detailed review of systems today was otherwise stable  BREAST CANCER HISTORY: From the original intake note:  [place] is a history of right-sided breast cancer dating back to 2005. She had a lumpectomy with sentinel lymph node sampling, chemotherapy, and radiation. I do not have access to those records at present.  More recently she had bilateral screening mammography at the [facility] 09/25/2016 showing a possible mass in the right breast. Diagnostic mammography with ultrasonography on 09/28/2016 the patient  underwent right diagnostic mammography with tomography and right breast ultrasonography. The breast density was category A. In the right breast at the 10:00 position there was an irregular mass measuring 2.5 cm. Ultrasound identified this the 10:00 radiant 10 cm from the nipple measuring 2.4 cm. In the right axilla there was an abnormal lymph node measuring 1.3 cm with other normal-appearing lymph nodes.  On 10/01/2016 she  underwent biopsy of the right breast mass in question as well as the suspicious axillary lymph node. 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 The proliferation marker was 70% in the breast lesion and 50% in the lymph node.  Her subsequent history is as detailed below.   PAST MEDICAL HISTORY: Past Medical History:  Diagnosis Date  . Alopecia areata 11/28/2009  . Bell's palsy   . BREAST CANCER, HX OF 2005  . Cancer [facility]) 2005   Breast cancer   chemotherapy and radiation  . CKD (chronic kidney disease) stage 3, GFR 30-59 ml/min 06/08/2015  . Dry eye syndrome   . Fasting hyperglycemia   . HIP FRACTURE, RIGHT 05/06/2008  . HIV (human immunodeficiency virus infection) ([place])   . HIV DISEASE 03/27/2006  . HYPERLIPIDEMIA, MIXED 12/15/2007  . HYPERTENSION 03/27/2006  . 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 MENORRHAGIA, POSTMENOPAUSAL 02/03/2009  . Osteoarthritis of left knee 06/08/2015  . OSTEOARTHROSIS, LOCAL, SCND, UNSPC SITE 04/07/2007  . PVD 04/07/2007  . Tinea capitis   . TRIGGER FINGER 05/06/2008  . Unspecified vitamin D deficiency 08/06/2007    PAST SURGICAL HISTORY: Past Surgical History:  Procedure Laterality  Date  . BREAST LUMPECTOMY Right    2005  . COLONOSCOPY    . HYSTEROSCOPY  2006  . KNEE ARTHROSCOPY Left 2002  . LYMPH NODE DISSECTION  2005  . MASTECTOMY MODIFIED RADICAL Right 11/05/2016   Procedure:  RIGHT MASTECTOMY MODIFIED RADICAL;  Surgeon: Fanny Skates, MD;  Location: [facility];  Service: General;  Laterality: Right;  . MODIFIED RADICAL MASTECTOMY Right 11/05/2016  . placement   of port-a-cath    . PORT-A-CATH REMOVAL  2006   insertion 2005  . PORTACATH PLACEMENT Right 11/05/2016   Procedure: INSERTION PORT-A-CATH WITH ULTRA SOUND;  Surgeon: Fanny Skates, MD;  Location: [facility];  Service: General;  Laterality: Right;  . removal of port a cath  12/2014  . THYROID SURGERY  2009   Ablation   . TOTAL KNEE ARTHROPLASTY Left 02/06/2016   Procedure: TOTAL KNEE ARTHROPLASTY;  Surgeon: Frederik Pear, MD;  Location: [facility];  Service: Orthopedics;  Laterality: Left;  . TUBAL LIGATION      FAMILY HISTORY Family History  Problem Relation Age of Onset  . Heart attack Brother        Massive MI in 30s  . Stroke Brother        CAD  . Kidney disease Mother   . Stroke Mother   . Diabetes Mother   . Liver disease Sister   . COPD Sister        had I-131 rx of hyperthyroidism  . Diabetes Sister   . Stroke Sister   . Cancer Paternal Uncle   . Cancer Cousin   . Heart failure Father   . Heart disease Father   . Arthritis Father   . Sarcoidosis Sister   The patient's father died at the age of 80 in the setting of Alzheimer's disease. The patient's mother died at the age of 36 from complications of diabetes. The patient has 3 brothers, 4 sisters. There is no history of breast or ovarian cancer in the family area   GYNECOLOGIC HISTORY:  Patient's last menstrual period was 11/06/2003.  menarche age 73, first live birth age 57, the patient is [place] P2. She stopped having periods in 2005, with her chemotherapy; she never took hormone replacement   SOCIAL HISTORY:  Murdered work for the [employer] more than 20 years. 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 At home she lives with her son Forestine Na, her daughter [name] who works for [employer] and  her granddaughter Donita Brooks, 21 y/o AS OF APRIL 2018     ADVANCED DIRECTIVES:  not in place    HEALTH MAINTENANCE: Social History  Substance Use Topics  . Smoking status: Never Smoker  . Smokeless tobacco: Never Used  . Alcohol use No     Colonoscopy:  PAP:  Bone density:   Allergies  Allergen Reactions  . Lisinopril Anaphylaxis and Swelling    Swelling of tongue and mouth 11/05/16- tolerates Olmesartan  . Pepcid [Famotidine] Other (See Comments)    PPI H2, BLOCKERS LOWER GASTRIC PH WHICH WOULD LEAD TO SUBTHERAPEUTIC RILPIVIRINE LEVELS AND POTENTIAL VIROLOGICAL FAILURE WITH RESISTANCE  . Prilosec [Omeprazole] Other (See Comments)    PPI H2, BLOCKERS LOWER GASTRIC PH WHICH WOULD LEAD TO SUBTHERAPEUTIC RILPIVIRINE LEVELS AND POTENTIAL VIROLOGICAL FAILURE WITH RESISTANCE  . Tums [Calcium Carbonate Antacid] Other (See Comments)    TUMS ANTACIDS CAN LOWER GASTRICWHICH COULD  LEAD TO SUBTHERAPEUTIC RILPIVIRINE LEVELS AND POTENTIAL VIROLOGICAL FAILURE WITH RESISTANCE TUMS CAN  BE GIVEN BUT NEED CONSULT WITH ID PHARMACY RE TIMING. I PREFER HER TO AVOID ALL TOGETHER    Current Outpatient Prescriptions  Medication Sig Dispense Refill  . CALCIUM-MAGNESIUM-ZINC PO Take by mouth.    . Cholecalciferol (VITAMIN D3) 2000 units TABS Take 2,000 Units by mouth daily with lunch.    . co-enzyme Q-10 30 MG capsule Take 1 tablet by mouth daily.    . darunavir-cobicistat (PREZCOBIX) 800-150 MG tablet TAKE 1 TABLET BY MOUTH DAILY. SWALLOW WHOLE. DO NOT CRUSH, BREAK OR CHEW TABLETS. TAKE WITH FOOD. (Patient taking differently: Take 1 tablet by mouth every evening. TAKE 1 TABLET BY MOUTH DAILY. SWALLOW WHOLE. DO NOT CRUSH, BREAK OR CHEW TABLETS. TAKE WITH FOOD.) 30 tablet 11  . dexamethasone (DECADRON) 4 MG tablet Take 2 tablets (8 mg total) by mouth daily. Start the day after chemotherapy for 2 days. Take with food. 30 tablet 1  . Dolutegravir-Rilpivirine (JULUCA) 50-25 MG TABS Take 1 tablet by mouth daily with  breakfast. Take with the Prezcobix. 30 tablet 9  . gabapentin (NEURONTIN) 300 MG capsule Take 1 capsule (300 mg total) by mouth 4 (four) times daily. 120 capsule 4  . HYDROcodone-acetaminophen (NORCO/VICODIN) 5-325 MG tablet Take 1 tablet by mouth every 6 (six) hours as needed for moderate pain. 120 tablet 0  . levothyroxine (SYNTHROID, LEVOTHROID) 175 MCG tablet Take 1 tablet (175 mcg total) by mouth daily before breakfast. 90 tablet 1  . lidocaine-prilocaine (EMLA) cream Apply to affected area once 30 g 3  . loratadine (CLARITIN) 10 MG tablet Take 10 mg by mouth daily as needed for itching. Reported on 06/08/2015    . LORazepam (ATIVAN) 0.5 MG tablet Take 1 tablet (0.5 mg total) by mouth at bedtime as needed (Nausea or vomiting). 30 tablet 0  . methocarbamol (ROBAXIN) 500 MG tablet Take 500 mg by mouth every 8 (eight) hours as needed for muscle spasms.    Marland Kitchen olmesartan-hydrochlorothiazide (BENICAR HCT) 40-25 MG tablet TAKE 1 TABLET EVERY DAY (Patient taking differently: TAKE 1 TABLET EVERY DAY WITH LUNCH) 90 tablet 1  . Omega-3 Fatty Acids (FISH OIL PO) Take 1 capsule by mouth daily with lunch.    . Pitavastatin Calcium 4 MG TABS Take 1 tablet (4 mg total) by mouth daily. This may be placebo, study provided. Do not dispense. (Patient taking differently: Take 4 mg by mouth daily with lunch. This may be placebo, study provided. Do not dispense.) 30 tablet 11  . Probiotic Product (PROBIOTIC PO) Take 1 capsule by mouth daily with lunch.    . prochlorperazine (COMPAZINE) 10 MG tablet Take 1 tablet (10 mg total) by mouth every 6 (six) hours as needed (Nausea or vomiting). 30 tablet 1   No current facility-administered medications for this visit.     OBJECTIVE:   Vitals:   12/31/16 1129  BP: (!) 95/59  Pulse: 76  Resp: 18  Temp: 98.1 F (36.7 C)     Body mass index is 39.63 kg/m.        ECOG FS:1 - Symptomatic but completely ambulatory GENERAL: Patient is a well appearing woman in no acute  distress HEENT:  Sclerae anicteric.  Oropharynx clear and moist. No ulcerations or evidence of oropharyngeal candidiasis. Neck is supple.  NODES:  No cervical, supraclavicular, or axillary lymphadenopathy palpated.  BREAST EXAM:  Deferred. LUNGS:  Clear to auscultation bilaterally.  No wheezes or rhonchi. HEART:  Regular rate and rhythm. No murmur appreciated. ABDOMEN:  Soft, nontender.  Positive, normoactive bowel  sounds. No organomegaly palpated. MSK:  No focal spinal tenderness to palpation. Full range of motion bilaterally in the upper extremities. EXTREMITIES:  No peripheral edema.   SKIN:  Clear with no obvious rashes or skin changes. No nail dyscrasia. NEURO:  Nonfocal. Well oriented.  Appropriate affect.      LAB RESULTS:  CMP     Component Value Date/Time   NA 144 12/17/2016 1137   K 3.9 12/17/2016 1137   CL 103 11/12/2016 0900   CO2 29 12/17/2016 1137   GLUCOSE 114 12/17/2016 1137   BUN 10.4 12/17/2016 1137   CREATININE 1.4 (H) 12/17/2016 1137   CALCIUM 10.7 (H) 12/17/2016 1137   PROT 7.3 12/17/2016 1137   ALBUMIN 3.2 (L) 12/17/2016 1137   AST 20 12/17/2016 1137   ALT 17 12/17/2016 1137   ALKPHOS 86 12/17/2016 1137   BILITOT <0.22 12/17/2016 1137   GFRNONAA 43 (L) 11/06/2016 0333   GFRNONAA 42 (L) 09/18/2016 0834   GFRAA 50 (L) 11/06/2016 0333   GFRAA 48 (L) 09/18/2016 0834    No results found for: TOTALPROTELP, ALBUMINELP, A1GS, A2GS, BETS, BETA2SER, GAMS, MSPIKE, SPEI  No results found for: Nils Pyle, [facility]  Lab Results  Component Value Date   WBC 10.9 (H) 12/31/2016   NEUTROABS 7.6 (H) 12/31/2016   HGB 12.3 12/31/2016   HCT 37.7 12/31/2016   MCV 99.0 12/31/2016   PLT 148 12/31/2016      Chemistry      Component Value Date/Time   NA 144 12/17/2016 1137   K 3.9 12/17/2016 1137   CL 103 11/12/2016 0900   CO2 29 12/17/2016 1137   BUN 10.4 12/17/2016 1137   CREATININE 1.4 (H) 12/17/2016 1137   GLU 104 02/13/2016 1358        Component Value Date/Time   CALCIUM 10.7 (H) 12/17/2016 1137   ALKPHOS 86 12/17/2016 1137   AST 20 12/17/2016 1137   ALT 17 12/17/2016 1137   BILITOT <0.22 12/17/2016 1137       Lab Results  Component Value Date   LABCA2 11 04/20/2008    No components found for: TFTDDU202  No results for input(s): INR in the last 168 hours.  Urinalysis    Component Value Date/Time   COLORURINE YELLOW 01/27/2016 1028   APPEARANCEUR CLEAR 01/27/2016 1028   LABSPEC 1.028 01/27/2016 1028   PHURINE 5.5 01/27/2016 1028   GLUCOSEU NEGATIVE 01/27/2016 1028   GLUCOSEU NEG mg/dL 07/23/2006 2113   HGBUR NEGATIVE 01/27/2016 1028   BILIRUBINUR NEGATIVE 01/27/2016 1028   KETONESUR NEGATIVE 01/27/2016 1028   PROTEINUR NEGATIVE 01/27/2016 1028   UROBILINOGEN 1 07/23/2006 2113   NITRITE NEGATIVE 01/27/2016 1028   LEUKOCYTESUR NEGATIVE 01/27/2016 1028     STUDIES: She had questions about her CT scans of the chest abdomen and pelvis obtained 11/16/2016 and we reviewed those again today  ELIGIBLE FOR AVAILABLE RESEARCH PROTOCOL: no  ASSESSMENT: 61 y.o. [place] woman   (1) status post right lumpectomy and sentinel lymph node sampling October 2005 for a 0.6 cm invasive ductal carcinoma involving one out of 2 sentinel lymph nodes sampled, grade 3, triple-negative, treated adjuvantly with doxorubicin and cyclophosphamide 4 followed by weekly paclitaxel 7, followed by adjuvant radiation  RECURRENT DISEASE: (2) status post right breast upper outer quadrant biopsy and right axillary lymph node biopsy 10/01/2016, both positive for a T2 N1, stage IIIB invasive ductal carcinoma, triple negative, with an MIB-1 of 50-70%  (3) status post right modified radical mastectomy 11/05/2016 showing a  pT2 pN1, stage IIIB invasive ductal carcinoma, grade 3, triple negative, with negative margins  (4) Not a candidate for radiation given prior history  (5) adjuvant chemotherapy will consist of carboplatin and  gemcitabine given days 1 and 8 of each 21 day cycle, for 6 cycles, starting 11/20/2016  (a) day 1 cycle 2 omitted because of neutropenia; Neupogen/Neulasta added  (5) HIV positivity--seen by Dr. Tommy Medal and on anti virals, last viral load undetectable  PLAN: Kurt is doing well today.  She will proceed with chemotherapy.  She has her neupogen scheduled and ordered.  I reviewed her labs with her.  She will call Dr. Tommy Medal about decreasing her anti-hypertensive.  I did add on a CD4 count to her labs today at his request.    Kayla Price will return in one week for labs and follow up with Dr. Jana Hakim, as well as day 8 chemotherapy.  She has appts for the next 3 days for Neupogen as well.    She knows to call for any problems that may develop before her next visit here.  A total of (20) minutes of face-to-face time was spent with this patient with greater than 50% of that time in counseling and care-coordination.   Scot Dock, NP   12/31/2016 11:37 AM Medical Oncology and Hematology [facility] [address] [place], [place] [address] Tel. [phone]    Fax. [phone]

## 2016-12-31 NOTE — Telephone Encounter (Signed)
Patient called requesting refill on Hydrocodone last refill 11/09/16

## 2017-01-01 ENCOUNTER — Other Ambulatory Visit: Payer: Self-pay | Admitting: Infectious Disease

## 2017-01-01 ENCOUNTER — Other Ambulatory Visit: Payer: Self-pay | Admitting: Endocrinology

## 2017-01-01 ENCOUNTER — Ambulatory Visit (HOSPITAL_BASED_OUTPATIENT_CLINIC_OR_DEPARTMENT_OTHER): Payer: Medicare HMO

## 2017-01-01 ENCOUNTER — Ambulatory Visit: Payer: Medicare HMO | Attending: Oncology | Admitting: Physical Therapy

## 2017-01-01 VITALS — BP 120/68 | HR 86 | Temp 97.5°F

## 2017-01-01 DIAGNOSIS — Z5189 Encounter for other specified aftercare: Secondary | ICD-10-CM | POA: Diagnosis not present

## 2017-01-01 DIAGNOSIS — C50411 Malignant neoplasm of upper-outer quadrant of right female breast: Secondary | ICD-10-CM

## 2017-01-01 DIAGNOSIS — M79601 Pain in right arm: Secondary | ICD-10-CM | POA: Insufficient documentation

## 2017-01-01 DIAGNOSIS — M25611 Stiffness of right shoulder, not elsewhere classified: Secondary | ICD-10-CM | POA: Diagnosis not present

## 2017-01-01 DIAGNOSIS — R600 Localized edema: Secondary | ICD-10-CM | POA: Diagnosis not present

## 2017-01-01 DIAGNOSIS — M6281 Muscle weakness (generalized): Secondary | ICD-10-CM | POA: Diagnosis not present

## 2017-01-01 DIAGNOSIS — Z171 Estrogen receptor negative status [ER-]: Principal | ICD-10-CM

## 2017-01-01 DIAGNOSIS — I10 Essential (primary) hypertension: Secondary | ICD-10-CM

## 2017-01-01 LAB — T-HELPER CELLS (CD4) COUNT (NOT AT ARMC)
CD4 T CELL ABS: 730 /uL (ref 400–2700)
CD4 T CELL HELPER: 32 % — AB (ref 33–55)

## 2017-01-01 MED ORDER — FILGRASTIM 480 MCG/0.8ML IJ SOSY
480.0000 ug | PREFILLED_SYRINGE | Freq: Once | INTRAMUSCULAR | Status: AC
  Administered 2017-01-01: 480 ug via SUBCUTANEOUS
  Filled 2017-01-01: qty 0.8

## 2017-01-01 NOTE — Patient Instructions (Signed)
First of all, check with your insurance company to see if provider is in [facility]                                            [address]  [place], [place] [address] [phone]    Does not file for insurance--- call for appointment with [facility]   (for wigs and compression sleeves / gloves/gauntlets )  [place], [place] [address] [phone]  Will file some insurances --- call for appointment   Second to [facility] (for mastectomy prosthetics and garments) [place], [place] [address] [phone] Will file some insurances --- call for appointment  [facility]  [address]  [place], [place] [address] [phone] Lower extremity garments  [facility] [address] [place], [place]  [address] [place] Sales rep:  Kern [place]:  [phone] [email or web address] Biocompression pumps   [employer]  Sales rep: Donneta Romberg:  [phone] [email or web address] Lyndal Pulley and Flexitouch pumps    Other Resources: National Lymphedema Network:  www.lymphnet.org [email or web address] for patient articles and purchase a self manual lymph drainage DVD [email or web address] has informative articles.

## 2017-01-01 NOTE — Therapy (Signed)
[place], [place], [address] Phone: [phone]   Fax:  [phone]  Physical Therapy Treatment  Patient Details  Name: Kayla Price MRN: [id number] Date of Birth: [birth year] Referring Provider: Magrinat  Encounter Date: 01/01/2017      PT End of Session - 01/01/17 1540    Visit Number 2   Number of Visits 9   Date for PT Re-Evaluation 01/24/17   PT Start Time 0626   PT Stop Time 1435   PT Time Calculation (min) 50 min   Activity Tolerance Patient tolerated treatment well   Behavior During Therapy [facility] for tasks assessed/performed      Past Medical History:  Diagnosis Date  . Alopecia areata 11/28/2009  . Bell's palsy   . BREAST CANCER, HX OF 2005  . Cancer [facility]) 2005   Breast cancer   chemotherapy and radiation  . CKD (chronic kidney disease) stage 3, GFR 30-59 ml/min 06/08/2015  . Dry eye syndrome   . Fasting hyperglycemia   . HIP FRACTURE, RIGHT 05/06/2008  . HIV (human immunodeficiency virus infection) ([place])   . HIV DISEASE 03/27/2006  . HYPERLIPIDEMIA, MIXED 12/15/2007  . HYPERTENSION 03/27/2006  . 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 MENORRHAGIA, POSTMENOPAUSAL 02/03/2009  . Osteoarthritis of left knee 06/08/2015  . OSTEOARTHROSIS, LOCAL, SCND, UNSPC SITE 04/07/2007  . PVD 04/07/2007  . Tinea capitis   . TRIGGER FINGER 05/06/2008  . Unspecified vitamin D deficiency 08/06/2007    Past Surgical History:  Procedure Laterality Date  . BREAST LUMPECTOMY Right    2005  . COLONOSCOPY    . HYSTEROSCOPY  2006  . KNEE ARTHROSCOPY Left 2002  . LYMPH NODE DISSECTION  2005  . MASTECTOMY MODIFIED RADICAL Right 11/05/2016   Procedure: RIGHT MASTECTOMY MODIFIED RADICAL;  Surgeon: Fanny Skates, MD;  Location: [place];  Service: General;  Laterality: Right;  . MODIFIED RADICAL MASTECTOMY Right 11/05/2016  . placement   of port-a-cath    . PORT-A-CATH REMOVAL  2006   insertion 2005   . PORTACATH PLACEMENT Right 11/05/2016   Procedure: INSERTION PORT-A-CATH WITH ULTRA SOUND;  Surgeon: Fanny Skates, MD;  Location: [place];  Service: General;  Laterality: Right;  . removal of port a cath  12/2014  . THYROID SURGERY  2009   Ablation   . TOTAL KNEE ARTHROPLASTY Left 02/06/2016   Procedure: TOTAL KNEE ARTHROPLASTY;  Surgeon: Frederik Pear, MD;  Location: [place];  Service: Orthopedics;  Laterality: Left;  . TUBAL LIGATION      There were no vitals filed for this visit.      Subjective Assessment - 01/01/17 1354    Subjective Pt comes in wearing her pink binder with a knitted knocker.  She does not get her appointment for a  mastectomy bra on July 19    Pertinent History R breast cancer in 2005 followed by lumpectomy, SLNB, chemo and radiation, R breast cancer recurrence in 2018 followed by mastectomy 5/14/12018  and ALND,( 10 nodes removed) and one seroma drained.  and chemo, L TKA on 02/06/2016, HIV positive since 1993, arthritis   Patient Stated Goals to get my arm comfortable   Currently in Pain? No/denies                         [facility] Adult PT Treatment/Exercise - 01/01/17 0001      Self-Care   Self-Care Other Self-Care Comments   Other Self-Care Comments  lymphedema risk  reduction, form for prophylactic sleeve ( gauntlet if needed if wrist is snug) only if [employer] does not cover. flyer for [facility] and [facility]     Manual Therapy   Manual Lymphatic Drainage (MLD) in supine, right shoulder collectors,( avoiding port) superficial and deep abdominal nodes,right inguinal nodes. right axillo-inguino anasamosis, right shoulder, upper arm, elbow and forearm with myofascial stretch to cording on forearm, return along pathways.                 PT Education - 01/01/17 1536    Education provided Yes   Education Details lymphedema risk reduction, FYNN, [employer], how to get a compression sleeve and have [employer] provide it.    Person(s) Educated Patient   Methods  Explanation;Handout   Comprehension Verbalized understanding;Returned demonstration                [facility] Goals - 01/01/17 1540      CC Long Term Goal  #1   Title Pt will report at least a 75% improvement in cording in RUE to allow for improved comfort.   Time 4   Period Weeks   Status On-going     CC Long Term Goal  #2   Title Pt will be independent in a home exercise program for continued strengthening and stretching of R shoulder   Time 4   Period Weeks   Status On-going     CC Long Term Goal  #3   Title Pt will be able to independently verbalize lymphedema risk reduction practices   Status Achieved     CC Long Term Goal  #4   Title Pt will obtain a compression sleeve to wear when exercising to help decrease risk of developing lymphedema.   Time 4   Status On-going            Plan - 01/01/17 1540    Clinical Impression Statement Lymphedema risk reduction reviewed in detail as pt will not be able to come to [facility] class as she has infusions scheduled on those days. That goal met.  Begain MLD with stretching to cording in right forearm    PT Next Visit Plan MLD with focus on right lateral trunk and to try to help with cording in RUE.  D myofascial work to cording in [employer], consider Tg soft to support lymphatics of RUE, show pt samples of compression bra    Consulted and Agree with Plan of Care Patient      Patient will benefit from skilled therapeutic intervention in order to improve the following deficits and impairments:  Increased fascial restricitons, Decreased knowledge of precautions, Increased edema, Decreased strength, Impaired UE functional use, Pain, Decreased scar mobility  Visit Diagnosis: Pain in right arm  Stiffness of right shoulder, not elsewhere classified  Muscle weakness (generalized)  Localized edema     Problem List Patient Active Problem List   Diagnosis Date Noted  . Recurrent cancer of right breast ([place]) 11/05/2016  .  Malignant neoplasm of upper-outer quadrant of right breast in female, estrogen receptor negative ([place]) 10/09/2016  . Primary localized osteoarthritis of left knee 02/06/2016  . Primary osteoarthritis of left knee 02/05/2016  . Osteoarthritis of left knee 06/08/2015  . CKD (chronic kidney disease) stage 3, GFR 30-59 ml/min 06/08/2015  . Vitamin D deficiency 09/23/2014  . Acute renal insufficiency 04/23/2014  . 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 Bell's palsy 12/04/2012  . Pharyngitis 01/14/2012  . Contact dermatitis 08/29/2011  . Dry eyes 08/20/2011  .  Dry mouth 08/20/2011  . Neuropathy 04/09/2011  . Insomnia 04/09/2011  . Obesities, morbid ([place]) 09/27/2010  . Endometrial polyp 12/22/2009  . ALOPECIA AREATA 11/28/2009  . ACUTE NASOPHARYNGITIS 07/18/2009  . MENORRHAGIA, POSTMENOPAUSAL 02/03/2009  . ABNORMAL GLANDULAR PAPANICOLAOU SMEAR OF CERVIX 11/04/2008  . BROKEN TOOTH, WITH COMPLICATION 34/37/3578  . ALOPECIA 05/06/2008  . TRIGGER FINGER 05/06/2008  . HIP FRACTURE, RIGHT 05/06/2008  . ARTHROSCOPY, LEFT KNEE, HX OF 02/03/2008  . HYPERLIPIDEMIA, MIXED 12/15/2007  . HAND PAIN, RIGHT 12/15/2007  . Other abnormal glucose 12/15/2007  . UNSPECIFIED VITAMIN D DEFICIENCY 08/06/2007  . TINEA CAPITIS 08/04/2007  . CNTC DERMATITIS&OTH ECZEMA DUE OTH CHEM PRODUCTS 08/04/2007  . PVD 04/07/2007  . Secondary localized osteoarthrosis 04/07/2007  . Human immunodeficiency virus (HIV) disease ([place]) 03/27/2006  . Essential hypertension 03/27/2006  . BREAST CANCER, HX OF 03/27/2006   Donato Heinz. Owens Shark PT  Norwood Levo 01/01/2017, 3:44 PM  [place], [place], [address] Phone: [phone]   Fax:  [phone]  Name: Kayla Price MRN: [id number] Date of Birth: 29-Aug-1955

## 2017-01-01 NOTE — Patient Instructions (Signed)
Tbo-Filgrastim injection What is this medicine? TBO-FILGRASTIM (T B O fil GRA stim) is a granulocyte colony-stimulating factor that stimulates the growth of neutrophils, a type of white blood cell important in the body's fight against infection. It is used to reduce the incidence of fever and infection in patients with certain types of cancer who are receiving chemotherapy that affects the bone marrow. This medicine may be used for other purposes; ask your health care provider or pharmacist if you have questions. COMMON BRAND NAME(S): Granix What should I tell my health care provider before I take this medicine? They need to know if you have any of these conditions: -bone scan or tests planned -kidney disease -sickle cell anemia -an unusual or allergic reaction to tbo-filgrastim, filgrastim, pegfilgrastim, other medicines, foods, dyes, or preservatives -pregnant or trying to get pregnant -breast-feeding How should I use this medicine? This medicine is for injection under the skin. If you get this medicine at home, you will be taught how to prepare and give this medicine. Refer to the Instructions for Use that come with your medication packaging. Use exactly as directed. Take your medicine at regular intervals. Do not take your medicine more often than directed. It is important that you put your used needles and syringes in a special sharps container. Do not put them in a trash can. If you do not have a sharps container, call your pharmacist or healthcare provider to get one. Talk to your pediatrician regarding the use of this medicine in children. Special care may be needed. Overdosage: If you think you have taken too much of this medicine contact a poison control center or emergency room at once. NOTE: This medicine is only for you. Do not share this medicine with others. What if I miss a dose? It is important not to miss your dose. Call your doctor or health care professional if you miss a  dose. What may interact with this medicine? This medicine may interact with the following medications: -medicines that may cause a release of neutrophils, such as lithium This list may not describe all possible interactions. Give your health care provider a list of all the medicines, herbs, non-prescription drugs, or dietary supplements you use. Also tell them if you smoke, drink alcohol, or use illegal drugs. Some items may interact with your medicine. What should I watch for while using this medicine? You may need blood work done while you are taking this medicine. What side effects may I notice from receiving this medicine? Side effects that you should report to your doctor or health care professional as soon as possible: -allergic reactions like skin rash, itching or hives, swelling of the face, lips, or tongue -blood in the urine -dark urine -dizziness -fast heartbeat -feeling faint -shortness of breath or breathing problems -signs and symptoms of infection like fever or chills; cough; or sore throat -signs and symptoms of kidney injury like trouble passing urine or change in the amount of urine -stomach or side pain, or pain at the shoulder -sweating -swelling of the legs, ankles, or abdomen -tiredness Side effects that usually do not require medical attention (report to your doctor or health care professional if they continue or are bothersome): -bone pain -headache -muscle pain -vomiting This list may not describe all possible side effects. Call your doctor for medical advice about side effects. You may report side effects to FDA at 1-800-FDA-1088. Where should I keep my medicine? Keep out of the reach of children. Store in a refrigerator between   2 and 8 degrees C (36 and 46 degrees F). Keep in carton to protect from light. Throw away this medicine if it is left out of the refrigerator for more than 5 consecutive days. Throw away any unused medicine after the expiration  date. NOTE: This sheet is a summary. It may not cover all possible information. If you have questions about this medicine, talk to your doctor, pharmacist, or health care provider.  2018 Elsevier/Gold Standard (2015-08-01 19:07:04)  

## 2017-01-02 ENCOUNTER — Telehealth: Payer: Self-pay | Admitting: *Deleted

## 2017-01-02 ENCOUNTER — Ambulatory Visit (HOSPITAL_BASED_OUTPATIENT_CLINIC_OR_DEPARTMENT_OTHER): Payer: Medicare HMO

## 2017-01-02 VITALS — BP 140/82 | HR 72 | Temp 97.8°F | Resp 20

## 2017-01-02 DIAGNOSIS — C50411 Malignant neoplasm of upper-outer quadrant of right female breast: Secondary | ICD-10-CM | POA: Diagnosis not present

## 2017-01-02 DIAGNOSIS — Z5189 Encounter for other specified aftercare: Secondary | ICD-10-CM

## 2017-01-02 DIAGNOSIS — Z171 Estrogen receptor negative status [ER-]: Principal | ICD-10-CM

## 2017-01-02 MED ORDER — FILGRASTIM 480 MCG/0.8ML IJ SOSY
480.0000 ug | PREFILLED_SYRINGE | Freq: Once | INTRAMUSCULAR | Status: AC
  Administered 2017-01-02: 480 ug via SUBCUTANEOUS
  Filled 2017-01-02: qty 0.8

## 2017-01-02 NOTE — Patient Instructions (Signed)
Tbo-Filgrastim injection What is this medicine? TBO-FILGRASTIM (T B O fil GRA stim) is a granulocyte colony-stimulating factor that stimulates the growth of neutrophils, a type of white blood cell important in the body's fight against infection. It is used to reduce the incidence of fever and infection in patients with certain types of cancer who are receiving chemotherapy that affects the bone marrow. This medicine may be used for other purposes; ask your health care provider or pharmacist if you have questions. COMMON BRAND NAME(S): Granix What should I tell my health care provider before I take this medicine? They need to know if you have any of these conditions: -bone scan or tests planned -kidney disease -sickle cell anemia -an unusual or allergic reaction to tbo-filgrastim, filgrastim, pegfilgrastim, other medicines, foods, dyes, or preservatives -pregnant or trying to get pregnant -breast-feeding How should I use this medicine? This medicine is for injection under the skin. If you get this medicine at home, you will be taught how to prepare and give this medicine. Refer to the Instructions for Use that come with your medication packaging. Use exactly as directed. Take your medicine at regular intervals. Do not take your medicine more often than directed. It is important that you put your used needles and syringes in a special sharps container. Do not put them in a trash can. If you do not have a sharps container, call your pharmacist or healthcare provider to get one. Talk to your pediatrician regarding the use of this medicine in children. Special care may be needed. Overdosage: If you think you have taken too much of this medicine contact a poison control center or emergency room at once. NOTE: This medicine is only for you. Do not share this medicine with others. What if I miss a dose? It is important not to miss your dose. Call your doctor or health care professional if you miss a  dose. What may interact with this medicine? This medicine may interact with the following medications: -medicines that may cause a release of neutrophils, such as lithium This list may not describe all possible interactions. Give your health care provider a list of all the medicines, herbs, non-prescription drugs, or dietary supplements you use. Also tell them if you smoke, drink alcohol, or use illegal drugs. Some items may interact with your medicine. What should I watch for while using this medicine? You may need blood work done while you are taking this medicine. What side effects may I notice from receiving this medicine? Side effects that you should report to your doctor or health care professional as soon as possible: -allergic reactions like skin rash, itching or hives, swelling of the face, lips, or tongue -blood in the urine -dark urine -dizziness -fast heartbeat -feeling faint -shortness of breath or breathing problems -signs and symptoms of infection like fever or chills; cough; or sore throat -signs and symptoms of kidney injury like trouble passing urine or change in the amount of urine -stomach or side pain, or pain at the shoulder -sweating -swelling of the legs, ankles, or abdomen -tiredness Side effects that usually do not require medical attention (report to your doctor or health care professional if they continue or are bothersome): -bone pain -headache -muscle pain -vomiting This list may not describe all possible side effects. Call your doctor for medical advice about side effects. You may report side effects to FDA at 1-800-FDA-1088. Where should I keep my medicine? Keep out of the reach of children. Store in a refrigerator between   2 and 8 degrees C (36 and 46 degrees F). Keep in carton to protect from light. Throw away this medicine if it is left out of the refrigerator for more than 5 consecutive days. Throw away any unused medicine after the expiration  date. NOTE: This sheet is a summary. It may not cover all possible information. If you have questions about this medicine, talk to your doctor, pharmacist, or health care provider.  2018 Elsevier/Gold Standard (2015-08-01 19:07:04)  

## 2017-01-02 NOTE — Telephone Encounter (Signed)
Patient called regarding her Benicar. She states her blood pressures have been soft and wants to know if she still should be on this medication. Per Dr Tommy Medal, patient should hold this and keep an eye on her pressures and follow up with her primary care physician. Landis Gandy, RN

## 2017-01-03 ENCOUNTER — Ambulatory Visit (HOSPITAL_BASED_OUTPATIENT_CLINIC_OR_DEPARTMENT_OTHER): Payer: Medicare HMO

## 2017-01-03 ENCOUNTER — Ambulatory Visit: Payer: Medicare HMO | Admitting: Physical Therapy

## 2017-01-03 VITALS — BP 117/74 | HR 86 | Temp 97.5°F | Resp 18

## 2017-01-03 DIAGNOSIS — C50411 Malignant neoplasm of upper-outer quadrant of right female breast: Secondary | ICD-10-CM

## 2017-01-03 DIAGNOSIS — M6281 Muscle weakness (generalized): Secondary | ICD-10-CM

## 2017-01-03 DIAGNOSIS — R600 Localized edema: Secondary | ICD-10-CM | POA: Diagnosis not present

## 2017-01-03 DIAGNOSIS — M79601 Pain in right arm: Secondary | ICD-10-CM

## 2017-01-03 DIAGNOSIS — M25611 Stiffness of right shoulder, not elsewhere classified: Secondary | ICD-10-CM | POA: Diagnosis not present

## 2017-01-03 DIAGNOSIS — Z5189 Encounter for other specified aftercare: Secondary | ICD-10-CM | POA: Diagnosis not present

## 2017-01-03 DIAGNOSIS — Z171 Estrogen receptor negative status [ER-]: Principal | ICD-10-CM

## 2017-01-03 LAB — T-HELPER CELLS (CD4) COUNT (NOT AT ARMC)

## 2017-01-03 MED ORDER — FILGRASTIM 480 MCG/0.8ML IJ SOSY: 480.0000 ug | PREFILLED_SYRINGE | Freq: Once | INTRAMUSCULAR | Status: DC

## 2017-01-03 MED ORDER — TBO-FILGRASTIM 480 MCG/0.8ML ~~LOC~~ SOSY
480.0000 ug | PREFILLED_SYRINGE | Freq: Once | SUBCUTANEOUS | Status: AC
  Administered 2017-01-03: 480 ug via SUBCUTANEOUS
  Filled 2017-01-03: qty 0.8

## 2017-01-03 NOTE — Patient Instructions (Signed)
Tbo-Filgrastim injection What is this medicine? TBO-FILGRASTIM (T B O fil GRA stim) is a granulocyte colony-stimulating factor that stimulates the growth of neutrophils, a type of white blood cell important in the body's fight against infection. It is used to reduce the incidence of fever and infection in patients with certain types of cancer who are receiving chemotherapy that affects the bone marrow. This medicine may be used for other purposes; ask your health care provider or pharmacist if you have questions. COMMON BRAND NAME(S): Granix What should I tell my health care provider before I take this medicine? They need to know if you have any of these conditions: -bone scan or tests planned -kidney disease -sickle cell anemia -an unusual or allergic reaction to tbo-filgrastim, filgrastim, pegfilgrastim, other medicines, foods, dyes, or preservatives -pregnant or trying to get pregnant -breast-feeding How should I use this medicine? This medicine is for injection under the skin. If you get this medicine at home, you will be taught how to prepare and give this medicine. Refer to the Instructions for Use that come with your medication packaging. Use exactly as directed. Take your medicine at regular intervals. Do not take your medicine more often than directed. It is important that you put your used needles and syringes in a special sharps container. Do not put them in a trash can. If you do not have a sharps container, call your pharmacist or healthcare provider to get one. Talk to your pediatrician regarding the use of this medicine in children. Special care may be needed. Overdosage: If you think you have taken too much of this medicine contact a poison control center or emergency room at once. NOTE: This medicine is only for you. Do not share this medicine with others. What if I miss a dose? It is important not to miss your dose. Call your doctor or health care professional if you miss a  dose. What may interact with this medicine? This medicine may interact with the following medications: -medicines that may cause a release of neutrophils, such as lithium This list may not describe all possible interactions. Give your health care provider a list of all the medicines, herbs, non-prescription drugs, or dietary supplements you use. Also tell them if you smoke, drink alcohol, or use illegal drugs. Some items may interact with your medicine. What should I watch for while using this medicine? You may need blood work done while you are taking this medicine. What side effects may I notice from receiving this medicine? Side effects that you should report to your doctor or health care professional as soon as possible: -allergic reactions like skin rash, itching or hives, swelling of the face, lips, or tongue -blood in the urine -dark urine -dizziness -fast heartbeat -feeling faint -shortness of breath or breathing problems -signs and symptoms of infection like fever or chills; cough; or sore throat -signs and symptoms of kidney injury like trouble passing urine or change in the amount of urine -stomach or side pain, or pain at the shoulder -sweating -swelling of the legs, ankles, or abdomen -tiredness Side effects that usually do not require medical attention (report to your doctor or health care professional if they continue or are bothersome): -bone pain -headache -muscle pain -vomiting This list may not describe all possible side effects. Call your doctor for medical advice about side effects. You may report side effects to FDA at 1-800-FDA-1088. Where should I keep my medicine? Keep out of the reach of children. Store in a refrigerator between   2 and 8 degrees C (36 and 46 degrees F). Keep in carton to protect from light. Throw away this medicine if it is left out of the refrigerator for more than 5 consecutive days. Throw away any unused medicine after the expiration  date. NOTE: This sheet is a summary. It may not cover all possible information. If you have questions about this medicine, talk to your doctor, pharmacist, or health care provider.  2018 Elsevier/Gold Standard (2015-08-01 19:07:04)  

## 2017-01-03 NOTE — Patient Instructions (Signed)
First of all, check with your insurance company to see if provider is in [facility]                                            [address]  [place], [place] [address] [phone]    Does not file for insurance--- call for appointment with [facility]   (for wigs and compression sleeves / gloves/gauntlets )  [place], [place] [address] [phone]  Will file some insurances --- call for appointment   Second to [facility] (for mastectomy prosthetics and garments) [place], [place] [address] [phone] Will file some insurances --- call for appointment  [facility]  [address]  [place], [place] [address] [phone] Lower extremity garments  [facility] [address] [place], [place]  [address] [place] Sales rep:  [employer] [place]:  [phone] [email or web address] Biocompression pumps   [employer]  Sales rep: Donneta Romberg:  [phone] [email or web address] Lyndal Pulley and Flexitouch pumps    Other Resources: National Lymphedema Network:  www.lymphnet.org [email or web address] for patient articles and purchase a self manual lymph drainage DVD [email or web address] has informative articles.

## 2017-01-03 NOTE — Therapy (Signed)
[place], [place], [address] Phone: [phone]   Fax:  [phone]  Physical Therapy Treatment  Patient Details  Name: Kayla Price MRN: [id number] Date of Birth: 03-22-1956 Referring Provider: Magrinat  Encounter Date: 01/03/2017      PT End of Session - 01/03/17 1347    Visit Number 3   Number of Visits 9   Date for PT Re-Evaluation 01/24/17   PT Start Time 4097   PT Stop Time 3532   PT Time Calculation (min) 41 min   Activity Tolerance Patient tolerated treatment well      Past Medical History:  Diagnosis Date  . Alopecia areata 11/28/2009  . Bell's palsy   . BREAST CANCER, HX OF 2005  . Cancer [facility]) 2005   Breast cancer   chemotherapy and radiation  . CKD (chronic kidney disease) stage 3, GFR 30-59 ml/min 06/08/2015  . Dry eye syndrome   . Fasting hyperglycemia   . HIP FRACTURE, RIGHT 05/06/2008  . HIV (human immunodeficiency virus infection) ([place])   . HIV DISEASE 03/27/2006  . HYPERLIPIDEMIA, MIXED 12/15/2007  . HYPERTENSION 03/27/2006  . 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 MENORRHAGIA, POSTMENOPAUSAL 02/03/2009  . Osteoarthritis of left knee 06/08/2015  . OSTEOARTHROSIS, LOCAL, SCND, UNSPC SITE 04/07/2007  . PVD 04/07/2007  . Tinea capitis   . TRIGGER FINGER 05/06/2008  . Unspecified vitamin D deficiency 08/06/2007    Past Surgical History:  Procedure Laterality Date  . BREAST LUMPECTOMY Right    2005  . COLONOSCOPY    . HYSTEROSCOPY  2006  . KNEE ARTHROSCOPY Left 2002  . LYMPH NODE DISSECTION  2005  . MASTECTOMY MODIFIED RADICAL Right 11/05/2016   Procedure: RIGHT MASTECTOMY MODIFIED RADICAL;  Surgeon: Fanny Skates, MD;  Location: [place];  Service: General;  Laterality: Right;  . MODIFIED RADICAL MASTECTOMY Right 11/05/2016  . placement   of port-a-cath    . PORT-A-CATH REMOVAL  2006   insertion 2005  . PORTACATH PLACEMENT Right 11/05/2016   Procedure:  INSERTION PORT-A-CATH WITH ULTRA SOUND;  Surgeon: Fanny Skates, MD;  Location: [place];  Service: General;  Laterality: Right;  . removal of port a cath  12/2014  . THYROID SURGERY  2009   Ablation   . TOTAL KNEE ARTHROPLASTY Left 02/06/2016   Procedure: TOTAL KNEE ARTHROPLASTY;  Surgeon: Frederik Pear, MD;  Location: [place];  Service: Orthopedics;  Laterality: Left;  . TUBAL LIGATION      There were no vitals filed for this visit.      Subjective Assessment - 01/03/17 1315    Subjective Pt says she is feeling good!  Pt went to [facility] and got her compresson sleeve and gauntlet    Pertinent History R breast cancer in 2005 followed by lumpectomy, SLNB, chemo and radiation, R breast cancer recurrence in 2018 followed by mastectomy 5/14/12018  and ALND,( 10 nodes removed) and one seroma drained.  and chemo, L TKA on 02/06/2016, HIV positive since 1993, arthritis   Patient Stated Goals to get my arm comfortable                         [facility] Adult PT Treatment/Exercise - 01/03/17 0001      Self-Care   Self-Care Other Self-Care Comments   Other Self-Care Comments  encouraged pt to wear sleeve at home to support lymphatic system  showed pt compression bras  and she will go to [facility] to look at some before her appointment next week      Shoulder Exercises: Sidelying   ABduction AAROM;5 reps   Other Sidelying Exercises small circles pointed to the ceilings    Other Sidelying Exercises pt with horizontal abduction with stretch across chest. Pt feels pulling down into wrist      Manual Therapy   Manual Lymphatic Drainage (MLD) in supine, right shoulder collectors,( avoiding port) superficial and deep abdominal nodes,right inguinal nodes. right axillo-inguino anasamosis, right shoulder, upper arm, elbow and forearm with myofascial stretch to cording on forearm, return along pathways.  then to sidelying for posterior interaxillary anastamosis and back, Pt raised arm  overhead for stretch to side and deep breaths to stretch sidebody                         [facility] Goals - 01/01/17 1540      CC Long Term Goal  #1   Title Pt will report at least a 75% improvement in cording in RUE to allow for improved comfort.   Time 4   Period Weeks   Status On-going     CC Long Term Goal  #2   Title Pt will be independent in a home exercise program for continued strengthening and stretching of R shoulder   Time 4   Period Weeks   Status On-going     CC Long Term Goal  #3   Title Pt will be able to independently verbalize lymphedema risk reduction practices   Status Achieved     CC Long Term Goal  #4   Title Pt will obtain a compression sleeve to wear when exercising to help decrease risk of developing lymphedema.   Time 4   Status On-going            Plan - 01/03/17 1347    Clinical Impression Statement Pt appears to be doing well.  She tolerated her compression sleeve well, but still has palpable cording in forearm to wrist and fullness at lateral right chest She will beneft from compression bra and was encouraged to wear her sleeve.    Rehab Potential Good   Clinical Impairments Affecting Rehab Potential pt undergoing chemo, past hx of radiation    PT Frequency 2x / week   PT Duration 4 weeks   PT Treatment/Interventions ADLs/Self Care Home Management;Therapeutic exercise;Therapeutic activities;Patient/family education;Manual techniques;Manual lymph drainage;Scar mobilization;Passive range of motion;Taping   PT Next Visit Plan MLD with focus on right lateral trunk and to try to help with cording in RUE.  D myofascial work to cording in [employer],    [employer] and Agree with Plan of Care Patient      Patient will benefit from skilled therapeutic intervention in order to improve the following deficits and impairments:  Increased fascial restricitons, Decreased knowledge of precautions, Increased edema, Decreased strength, Impaired  UE functional use, Pain, Decreased scar mobility  Visit Diagnosis: Pain in right arm  Stiffness of right shoulder, not elsewhere classified  Localized edema  Muscle weakness (generalized)     Problem List Patient Active Problem List   Diagnosis Date Noted  . Recurrent cancer of right breast ([place]) 11/05/2016  . Malignant neoplasm of upper-outer quadrant of right breast in female, estrogen receptor negative ([place]) 10/09/2016  . Primary localized osteoarthritis of left knee 02/06/2016  . Primary osteoarthritis of left knee 02/05/2016  . Osteoarthritis of left knee 06/08/2015  . CKD (chronic kidney disease) stage  3, GFR 30-59 ml/min 06/08/2015  . Vitamin D deficiency 09/23/2014  . Acute renal insufficiency 04/23/2014  . 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 Bell's palsy 12/04/2012  . Pharyngitis 01/14/2012  . Contact dermatitis 08/29/2011  . Dry eyes 08/20/2011  . Dry mouth 08/20/2011  . Neuropathy 04/09/2011  . Insomnia 04/09/2011  . Obesities, morbid ([place]) 09/27/2010  . Endometrial polyp 12/22/2009  . ALOPECIA AREATA 11/28/2009  . ACUTE NASOPHARYNGITIS 07/18/2009  . MENORRHAGIA, POSTMENOPAUSAL 02/03/2009  . ABNORMAL GLANDULAR PAPANICOLAOU SMEAR OF CERVIX 11/04/2008  . BROKEN TOOTH, WITH COMPLICATION 80/16/5537  . ALOPECIA 05/06/2008  . TRIGGER FINGER 05/06/2008  . HIP FRACTURE, RIGHT 05/06/2008  . ARTHROSCOPY, LEFT KNEE, HX OF 02/03/2008  . HYPERLIPIDEMIA, MIXED 12/15/2007  . HAND PAIN, RIGHT 12/15/2007  . Other abnormal glucose 12/15/2007  . UNSPECIFIED VITAMIN D DEFICIENCY 08/06/2007  . TINEA CAPITIS 08/04/2007  . CNTC DERMATITIS&OTH ECZEMA DUE OTH CHEM PRODUCTS 08/04/2007  . PVD 04/07/2007  . Secondary localized osteoarthrosis 04/07/2007  . Human immunodeficiency virus (HIV) disease ([place]) 03/27/2006  . Essential hypertension 03/27/2006  . BREAST CANCER, HX OF 03/27/2006   Donato Heinz. Owens Shark PT  Norwood Levo 01/03/2017, 1:49 PM  [place], [place], [address] Phone: [phone]   Fax:  [phone]  Name: Kayla Price MRN: [id number] Date of Birth: 1955-10-29

## 2017-01-04 ENCOUNTER — Ambulatory Visit (HOSPITAL_COMMUNITY)
Admission: RE | Admit: 2017-01-04 | Discharge: 2017-01-04 | Disposition: A | Discharge status: Home / Self Care | Payer: Medicare HMO | Source: Ambulatory Visit | Attending: Family Medicine | Admitting: Family Medicine

## 2017-01-04 DIAGNOSIS — R938 Abnormal findings on diagnostic imaging of other specified body structures: Secondary | ICD-10-CM | POA: Diagnosis not present

## 2017-01-04 DIAGNOSIS — N8500 Endometrial hyperplasia, unspecified: Secondary | ICD-10-CM | POA: Diagnosis not present

## 2017-01-04 DIAGNOSIS — R9389 Abnormal findings on diagnostic imaging of other specified body structures: Secondary | ICD-10-CM

## 2017-01-07 ENCOUNTER — Ambulatory Visit (HOSPITAL_BASED_OUTPATIENT_CLINIC_OR_DEPARTMENT_OTHER): Payer: Medicare HMO

## 2017-01-07 ENCOUNTER — Other Ambulatory Visit (HOSPITAL_COMMUNITY)
Admission: RE | Admit: 2017-01-07 | Discharge: 2017-01-07 | Disposition: A | Discharge status: Home / Self Care | Payer: Medicare HMO | Source: Ambulatory Visit | Attending: Oncology | Admitting: Oncology

## 2017-01-07 ENCOUNTER — Ambulatory Visit (HOSPITAL_BASED_OUTPATIENT_CLINIC_OR_DEPARTMENT_OTHER): Payer: Medicare HMO | Admitting: Oncology

## 2017-01-07 ENCOUNTER — Telehealth: Payer: Self-pay | Admitting: *Deleted

## 2017-01-07 ENCOUNTER — Encounter: Payer: Self-pay | Admitting: Oncology

## 2017-01-07 VITALS — BP 154/85 | HR 65 | Temp 97.9°F | Resp 18 | Ht 67.0 in | Wt 262.1 lb

## 2017-01-07 DIAGNOSIS — C50911 Malignant neoplasm of unspecified site of right female breast: Secondary | ICD-10-CM

## 2017-01-07 DIAGNOSIS — Z5111 Encounter for antineoplastic chemotherapy: Secondary | ICD-10-CM

## 2017-01-07 DIAGNOSIS — Z171 Estrogen receptor negative status [ER-]: Principal | ICD-10-CM

## 2017-01-07 DIAGNOSIS — B20 Human immunodeficiency virus [HIV] disease: Secondary | ICD-10-CM

## 2017-01-07 DIAGNOSIS — C50411 Malignant neoplasm of upper-outer quadrant of right female breast: Secondary | ICD-10-CM

## 2017-01-07 DIAGNOSIS — Z853 Personal history of malignant neoplasm of breast: Secondary | ICD-10-CM | POA: Diagnosis not present

## 2017-01-07 DIAGNOSIS — R938 Abnormal findings on diagnostic imaging of other specified body structures: Secondary | ICD-10-CM | POA: Diagnosis not present

## 2017-01-07 DIAGNOSIS — C773 Secondary and unspecified malignant neoplasm of axilla and upper limb lymph nodes: Secondary | ICD-10-CM

## 2017-01-07 DIAGNOSIS — Z029 Encounter for administrative examinations, unspecified: Secondary | ICD-10-CM | POA: Insufficient documentation

## 2017-01-07 LAB — CBC WITH DIFFERENTIAL/PLATELET
BASO%: 0.2 % (ref 0.0–2.0)
BASOS ABS: 0 10*3/uL (ref 0.0–0.1)
EOS ABS: 0 10*3/uL (ref 0.0–0.5)
EOS%: 0.5 % (ref 0.0–7.0)
HCT: 33 % — ABNORMAL LOW (ref 34.8–46.6)
HGB: 11 g/dL — ABNORMAL LOW (ref 11.6–15.9)
LYMPH%: 46.1 % (ref 14.0–49.7)
MCH: 32.8 pg (ref 25.1–34.0)
MCHC: 33.3 g/dL (ref 31.5–36.0)
MCV: 98.5 fL (ref 79.5–101.0)
MONO#: 0.7 10*3/uL (ref 0.1–0.9)
MONO%: 11.2 % (ref 0.0–14.0)
NEUT#: 2.8 10*3/uL (ref 1.5–6.5)
NEUT%: 42 % (ref 38.4–76.8)
Platelets: 171 10*3/uL (ref 145–400)
RBC: 3.35 10*6/uL — AB (ref 3.70–5.45)
RDW: 16.5 % — AB (ref 11.2–14.5)
WBC: 6.6 10*3/uL (ref 3.9–10.3)
lymph#: 3.1 10*3/uL (ref 0.9–3.3)

## 2017-01-07 LAB — COMPREHENSIVE METABOLIC PANEL
ALBUMIN: 3.2 g/dL — AB (ref 3.5–5.0)
ALK PHOS: 115 U/L (ref 40–150)
ALT: 15 U/L (ref 0–55)
AST: 9 U/L (ref 5–34)
Anion Gap: 5 mEq/L (ref 3–11)
BUN: 18.2 mg/dL (ref 7.0–26.0)
CHLORIDE: 108 meq/L (ref 98–109)
CO2: 29 meq/L (ref 22–29)
Calcium: 10.1 mg/dL (ref 8.4–10.4)
Creatinine: 1.3 mg/dL — ABNORMAL HIGH (ref 0.6–1.1)
EGFR: 50 mL/min/{1.73_m2} — AB (ref >90)
GLUCOSE: 87 mg/dL (ref 70–140)
POTASSIUM: 4.6 meq/L (ref 3.5–5.1)
SODIUM: 143 meq/L (ref 136–145)
Total Bilirubin: 0.36 mg/dL (ref 0.20–1.20)
Total Protein: 6.4 g/dL (ref 6.4–8.3)

## 2017-01-07 MED ORDER — SODIUM CHLORIDE 0.9% FLUSH
10.0000 mL | INTRAVENOUS | Status: DC | PRN
  Administered 2017-01-07: 10 mL
  Filled 2017-01-07: qty 10

## 2017-01-07 MED ORDER — TRIAMTERENE-HCTZ 37.5-25 MG PO CAPS: 1.0000 | ORAL_CAPSULE | Freq: Every day | ORAL | 4 refills | Status: DC

## 2017-01-07 MED ORDER — SODIUM CHLORIDE 0.9 % IV SOLN
Freq: Once | INTRAVENOUS | Status: AC
  Administered 2017-01-07: 13:00:00 via INTRAVENOUS

## 2017-01-07 MED ORDER — SODIUM CHLORIDE 0.9 % IV SOLN
2400.0000 mg | Freq: Once | INTRAVENOUS | Status: AC
  Administered 2017-01-07: 2400 mg via INTRAVENOUS
  Filled 2017-01-07: qty 63.12

## 2017-01-07 MED ORDER — CARBOPLATIN CHEMO INJECTION 450 MG/45ML
221.4000 mg | Freq: Once | INTRAVENOUS | Status: AC
  Administered 2017-01-07: 220 mg via INTRAVENOUS
  Filled 2017-01-07: qty 22

## 2017-01-07 MED ORDER — PALONOSETRON HCL INJECTION 0.25 MG/5ML
INTRAVENOUS | Status: AC
  Filled 2017-01-07: qty 5

## 2017-01-07 MED ORDER — HEPARIN SOD (PORK) LOCK FLUSH 100 UNIT/ML IV SOLN
500.0000 [IU] | Freq: Once | INTRAVENOUS | Status: AC | PRN
  Administered 2017-01-07: 500 [IU]
  Filled 2017-01-07: qty 5

## 2017-01-07 MED ORDER — DEXAMETHASONE SODIUM PHOSPHATE 10 MG/ML IJ SOLN
INTRAMUSCULAR | Status: AC
  Filled 2017-01-07: qty 1

## 2017-01-07 MED ORDER — PALONOSETRON HCL INJECTION 0.25 MG/5ML
0.2500 mg | Freq: Once | INTRAVENOUS | Status: AC
  Administered 2017-01-07: 0.25 mg via INTRAVENOUS

## 2017-01-07 MED ORDER — PEGFILGRASTIM 6 MG/0.6ML ~~LOC~~ PSKT
6.0000 mg | PREFILLED_SYRINGE | Freq: Once | SUBCUTANEOUS | Status: AC
  Administered 2017-01-07: 6 mg via SUBCUTANEOUS
  Filled 2017-01-07: qty 0.6

## 2017-01-07 MED ORDER — DEXAMETHASONE SODIUM PHOSPHATE 10 MG/ML IJ SOLN
10.0000 mg | Freq: Once | INTRAMUSCULAR | Status: AC
  Administered 2017-01-07: 10 mg via INTRAVENOUS

## 2017-01-07 NOTE — Progress Notes (Signed)
[facility]  Telephone:([phone] [phone]     ID: NERY MUZNY DOB: 06-05-1956  MR#: [id number]  YQI#:[id number]  Patient Care Team: Sharon Seller, NP as PCP - General (Nurse Practitioner) Daiva Eves, Lisette Grinder, MD as PCP - Infectious Diseases (Infectious Diseases) Ernesto Rutherford, MD as Consulting Physician (Ophthalmology) Romero Belling, MD as Consulting Physician (Endocrinology) Gean Birchwood, MD as Consulting Physician (Orthopedic Surgery) Coila Wardell, Valentino Hue, MD as Consulting Physician (Oncology) Claud Kelp, MD as Consulting Physician (General Surgery) Reva Bores, MD as Consulting Physician (Obstetrics and Gynecology) Lowella Dell, MD OTHER MD:  CHIEF COMPLAINT: Triple negative breast cancer, recurrent  CURRENT TREATMENT: Adjuvant chemotherapy  INTERVAL HISTORY: Briela returns today for follow-up and treatment of her recurrent triple negative breast cancer. This is day 8 cycle 3 of 6 planned cycles of carboplatin and gemcitabine which she receives days 1 and 8 of each 21 day cycle. She receives Neulasta on day 8 and she also receives Neupogen on days 23 and 4 of each cycle in order to keep the treatments on time.  We checked her CD4 count a week ago. This was 730, the percentage being 32. This is very favorable.  On one of her staging CT scans we noted a thickened endometrium so we set her up for a pelvic transvaginal ultrasound 01/04/2017. This showed the endometrial thickness up to 19 mm, with a complex heterogeneous cystic area centrally measuring up to 2 cm. The ovaries were not visualized and there were no adnexal masses.  REVIEW OF SYSTEMS: Deem continues to tolerate treatment well. Her blood pressure was a little diminished, which certainly can happen with chemotherapy. Her combined blood pressure/diuretic was stopped. Unfortunately she has gained about 8 pounds, which she feels anything correctly is all fluid. She has  joint pain here and there which is not more persistent or intense than before. She is having some right upper extremity swelling, which is being managed through physical therapy. She was recently given a sleeve. She has some pain in her toes particularly in the right foot and she is considering seeing a podiatrist. Aside from these issues a detailed review of systems today was stable  BREAST CANCER HISTORY: From the original intake note:  Olivene is a history of right-sided breast cancer dating back to 2005. She had a lumpectomy with sentinel lymph node sampling, chemotherapy, and radiation. I do not have access to those records at present.  More recently she [facility] showing a possible mass in the right breast. Diagnostic mammography with ultrasonography on 09/28/2016 the patient underwent right diagnostic mammography with tomography and right breast ultrasonography. The breast density was category A. In the right breast at the 10:00 position there was an irregular mass measuring 2.5 cm. Ultrasound identified this the 10:00 radiant 10 cm from the nipple measuring 2.4 cm. In the right axilla there was an abnormal lymph node measuring 1.3 cm with other normal-appearing lymph nodes.  On 10/01/2016 she  underwent biopsy of the right breast mass in question as well as the suspicious axillary lymph node. 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 The proliferation marker was 70% in the breast lesion and 50% in the lymph node.  Her subsequent history is as detailed below.   PAST MEDICAL HISTORY: Past Medical History:  Diagnosis Date  . Alopecia areata 11/28/2009  .  Bell's palsy   . BREAST CANCER, HX OF 2005  . Cancer [place]) 2005   Breast cancer   chemotherapy and radiation  . CKD (chronic kidney  disease) stage 3, GFR 30-59 ml/min 06/08/2015  . Dry eye syndrome   . Fasting hyperglycemia   . HIP FRACTURE, RIGHT 05/06/2008  . HIV (human immunodeficiency virus infection) (HCC)   . HIV DISEASE 03/27/2006  . HYPERLIPIDEMIA, MIXED 12/15/2007  . HYPERTENSION 03/27/2006  . 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 MENORRHAGIA, POSTMENOPAUSAL 02/03/2009  . Osteoarthritis of left knee 06/08/2015  . OSTEOARTHROSIS, LOCAL, SCND, UNSPC SITE 04/07/2007  . PVD 04/07/2007  . Tinea capitis   . TRIGGER FINGER 05/06/2008  . Unspecified vitamin D deficiency 08/06/2007    PAST SURGICAL HISTORY: Past Surgical History:  Procedure Laterality Date  . BREAST LUMPECTOMY Right    2005  . COLONOSCOPY    . HYSTEROSCOPY  2006  . KNEE ARTHROSCOPY Left 2002  . LYMPH NODE DISSECTION  2005  . MASTECTOMY MODIFIED RADICAL Right 11/05/2016   Procedure: RIGHT MASTECTOMY MODIFIED RADICAL;  Surgeon: Claud Kelp, MD;  Location: [facility] OR;  Service: General;  Laterality: Right;  . MODIFIED RADICAL MASTECTOMY Right 11/05/2016  . placement   of port-a-cath    . PORT-A-CATH REMOVAL  2006   insertion 2005  . PORTACATH PLACEMENT Right 11/05/2016   Procedure: INSERTION PORT-A-CATH WITH ULTRA SOUND;  Surgeon: Claud Kelp, MD;  Location: [facility] OR;  Service: General;  Laterality: Right;  . removal of port a cath  12/2014  . THYROID SURGERY  2009   Ablation   . TOTAL KNEE ARTHROPLASTY Left 02/06/2016   Procedure: TOTAL KNEE ARTHROPLASTY;  Surgeon: Gean Birchwood, MD;  Location: [facility] OR;  Service: Orthopedics;  Laterality: Left;  . TUBAL LIGATION      FAMILY HISTORY Family History  Problem Relation Age of Onset  . Heart attack Brother        Massive MI in 95s  . Stroke Brother        CAD  . Kidney disease Mother   . Stroke Mother   . Diabetes Mother   . Liver disease Sister   . COPD Sister        had I-131 rx of hyperthyroidism  . Diabetes Sister   . Stroke Sister   . Cancer Paternal Uncle   . Cancer Cousin   .  Heart failure Father   . Heart disease Father   . Arthritis Father   . Sarcoidosis Sister   The patient's father died at the age of 59 in the setting of Alzheimer's disease. The patient's mother died at the age of 52 from complications of diabetes. The patient has 3 brothers, 4 sisters. There is no history of breast or ovarian cancer in the family area   GYNECOLOGIC HISTORY:  Patient's last menstrual period was 11/06/2003.  menarche age 73, first live birth age 86, the patient is GX P2. She stopped having periods in 2005, with her chemotherapy; she never took hormone replacement   SOCIAL HISTORY:  Kennette worked for the [employer] more than 20 years. 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 At home she lives with her son [name], her daughter [name] who works for [employer] and her granddaughter [name], 36 y/o AS OF APRIL 2018     ADVANCED DIRECTIVES:  not in place    HEALTH MAINTENANCE: Social History  Substance Use Topics  . Smoking status: Never Smoker  .  Smokeless tobacco: Never Used  . Alcohol use No     Colonoscopy:  PAP:  Bone density:   Allergies  Allergen Reactions  . Lisinopril Anaphylaxis and Swelling    Swelling of tongue and mouth 11/05/16- tolerates Olmesartan  . Pepcid [Famotidine] Other (See Comments)    PPI H2, BLOCKERS LOWER GASTRIC PH WHICH WOULD LEAD TO SUBTHERAPEUTIC RILPIVIRINE LEVELS AND POTENTIAL VIROLOGICAL FAILURE WITH RESISTANCE  . Prilosec [Omeprazole] Other (See Comments)    PPI H2, BLOCKERS LOWER GASTRIC PH WHICH WOULD LEAD TO SUBTHERAPEUTIC RILPIVIRINE LEVELS AND POTENTIAL VIROLOGICAL FAILURE WITH RESISTANCE  . Tums [Calcium Carbonate Antacid] Other (See Comments)    TUMS ANTACIDS CAN LOWER GASTRICWHICH COULD  LEAD TO SUBTHERAPEUTIC RILPIVIRINE LEVELS AND POTENTIAL VIROLOGICAL FAILURE WITH RESISTANCE TUMS CAN BE GIVEN BUT NEED CONSULT WITH [facility] PHARMACY RE TIMING. I PREFER HER TO AVOID ALL  TOGETHER    Current Outpatient Prescriptions  Medication Sig Dispense Refill  . CALCIUM-MAGNESIUM-ZINC PO Take by mouth.    . Cholecalciferol (VITAMIN D3) 2000 units TABS Take 2,000 Units by mouth daily with lunch.    . co-enzyme Q-10 30 MG capsule Take 1 tablet by mouth daily.    . darunavir-cobicistat (PREZCOBIX) 800-150 MG tablet TAKE 1 TABLET BY MOUTH DAILY. SWALLOW WHOLE. DO NOT CRUSH, BREAK OR CHEW TABLETS. TAKE WITH FOOD. (Patient taking differently: Take 1 tablet by mouth every evening. TAKE 1 TABLET BY MOUTH DAILY. SWALLOW WHOLE. DO NOT CRUSH, BREAK OR CHEW TABLETS. TAKE WITH FOOD.) 30 tablet 11  . dexamethasone (DECADRON) 4 MG tablet Take 2 tablets (8 mg total) by mouth daily. Start the day after chemotherapy for 2 days. Take with food. 30 tablet 1  . Dolutegravir-Rilpivirine (JULUCA) 50-25 MG TABS Take 1 tablet by mouth daily with breakfast. Take with the Prezcobix. 30 tablet 9  . gabapentin (NEURONTIN) 300 MG capsule Take 1 capsule (300 mg total) by mouth 4 (four) times daily. 120 capsule 4  . HYDROcodone-acetaminophen (NORCO/VICODIN) 5-325 MG tablet Take 1 tablet by mouth every 6 (six) hours as needed for moderate pain. 120 tablet 0  . levothyroxine (SYNTHROID, LEVOTHROID) 150 MCG tablet TAKE 1 TABLET EVERY DAY BEFORE BREAKFAST 90 tablet 1  . levothyroxine (SYNTHROID, LEVOTHROID) 175 MCG tablet Take 1 tablet (175 mcg total) by mouth daily before breakfast. 90 tablet 1  . lidocaine-prilocaine (EMLA) cream Apply to affected area once 30 g 3  . loratadine (CLARITIN) 10 MG tablet Take 10 mg by mouth daily as needed for itching. Reported on 06/08/2015    . LORazepam (ATIVAN) 0.5 MG tablet Take 1 tablet (0.5 mg total) by mouth at bedtime as needed (Nausea or vomiting). 30 tablet 0  . methocarbamol (ROBAXIN) 500 MG tablet Take 500 mg by mouth every 8 (eight) hours as needed for muscle spasms.    Marland Kitchen olmesartan-hydrochlorothiazide (BENICAR HCT) 40-25 MG tablet TAKE 1 TABLET EVERY DAY 90 tablet  1  . Omega-3 Fatty Acids (FISH OIL PO) Take 1 capsule by mouth daily with lunch.    . Pitavastatin Calcium 4 MG TABS Take 1 tablet (4 mg total) by mouth daily. This may be placebo, study provided. Do not dispense. (Patient taking differently: Take 4 mg by mouth daily with lunch. This may be placebo, study provided. Do not dispense.) 30 tablet 11  . Probiotic Product (PROBIOTIC PO) Take 1 capsule by mouth daily with lunch.    . prochlorperazine (COMPAZINE) 10 MG tablet Take 1 tablet (10 mg total) by mouth every 6 (six) hours as needed (  Nausea or vomiting). 30 tablet 1  . triamterene-hydrochlorothiazide (DYAZIDE) 37.5-25 MG capsule Take 1 each (1 capsule total) by mouth daily. 90 capsule 4   No current facility-administered medications for this visit.    Facility-Administered Medications Ordered in Other Visits  Medication Dose Route Frequency Provider Last Rate Last Dose  . sodium chloride flush (NS) 0.9 % injection 10 mL  10 mL Intracatheter PRN Thorne Wirz, Valentino Hue, MD   10 mL at 01/07/17 1525    OBJECTIVE: Middle-aged African-American womanWho appears stated age  Vitals:   01/07/17 1101  BP: (!) 154/85  Pulse: 65  Resp: 18  Temp: 97.9 F (36.6 C)     Body mass index is 41.05 kg/m.    Filed Weights   01/07/17 1101  Weight: 262 lb 1.6 oz (118.9 kg)       ECOG FS:1 - Symptomatic but completely ambulatory  Sclerae unicteric, pupils round and equal Oropharynx clear and moist No cervical or supraclavicular adenopathy Lungs no rales or rhonchi Heart regular rate and rhythm Abd soft, Obese, nontender, positive bowel sounds MSK no focal spinal tenderness, grade 1 right upper extremity lymphedema without erythema Neuro: nonfocal, well oriented, appropriate affect Breasts: Deferred  LAB RESULTS:  CMP     Component Value Date/Time   NA 143 01/07/2017 1043   K 4.6 01/07/2017 1043   CL 103 11/12/2016 0900   CO2 29 01/07/2017 1043   GLUCOSE 87 01/07/2017 1043   BUN 18.2  01/07/2017 1043   CREATININE 1.3 (H) 01/07/2017 1043   CALCIUM 10.1 01/07/2017 1043   PROT 6.4 01/07/2017 1043   ALBUMIN 3.2 (L) 01/07/2017 1043   AST 9 01/07/2017 1043   ALT 15 01/07/2017 1043   ALKPHOS 115 01/07/2017 1043   BILITOT 0.36 01/07/2017 1043   GFRNONAA 43 (L) 11/06/2016 0333   GFRNONAA 42 (L) 09/18/2016 0834   GFRAA 50 (L) 11/06/2016 0333   GFRAA 48 (L) 09/18/2016 0834    No results found for: TOTALPROTELP, ALBUMINELP, A1GS, A2GS, BETS, BETA2SER, GAMS, MSPIKE, SPEI  No results found for: Ron Parker, [facility]  Lab Results  Component Value Date   WBC 6.6 01/07/2017   NEUTROABS 2.8 01/07/2017   HGB 11.0 (L) 01/07/2017   HCT 33.0 (L) 01/07/2017   MCV 98.5 01/07/2017   PLT 171 01/07/2017      Chemistry      Component Value Date/Time   NA 143 01/07/2017 1043   K 4.6 01/07/2017 1043   CL 103 11/12/2016 0900   CO2 29 01/07/2017 1043   BUN 18.2 01/07/2017 1043   CREATININE 1.3 (H) 01/07/2017 1043   GLU 104 02/13/2016 1358      Component Value Date/Time   CALCIUM 10.1 01/07/2017 1043   ALKPHOS 115 01/07/2017 1043   AST 9 01/07/2017 1043   ALT 15 01/07/2017 1043   BILITOT 0.36 01/07/2017 1043       Lab Results  Component Value Date   LABCA2 11 04/20/2008    No components found for: [id number]  No results for input(s): INR in the last 168 hours.  Urinalysis    Component Value Date/Time   COLORURINE YELLOW 01/27/2016 1028   APPEARANCEUR CLEAR 01/27/2016 1028   LABSPEC 1.028 01/27/2016 1028   PHURINE 5.5 01/27/2016 1028   GLUCOSEU NEGATIVE 01/27/2016 1028   GLUCOSEU NEG mg/dL 40/98/1191 4782   HGBUR NEGATIVE 01/27/2016 1028   BILIRUBINUR NEGATIVE 01/27/2016 1028   KETONESUR NEGATIVE 01/27/2016 1028   PROTEINUR NEGATIVE 01/27/2016 1028   UROBILINOGEN 1  07/23/2006 2113   NITRITE NEGATIVE 01/27/2016 1028   LEUKOCYTESUR NEGATIVE 01/27/2016 1028     STUDIES: US Transvaginal Non-ob  Result Date: 01/04/2017 CLINICAL DATA:   Thickened endometrium visualized on CT EXAM: ULTRASOUND PELVIS TRANSVAGINAL TECHNIQUE: Transvaginal ultrasound examination of the pelvis was performed including evaluation of the uterus, ovaries, adnexal regions, and pelvic cul-de-sac. COMPARISON:  CT 11/16/2016 FINDINGS: Uterus Measurements: 5.5 x 4.0 x 4.3 cm. No fibroids or other mass visualized. Endometrium Thickness: Thickened measuring up to 19 mm. Complex heterogeneous appearance with complex cystic areas centrally measuring 2.0 x 1.5 cm. Right ovary Measurements: Not visualized. No adnexal mass seen Left ovary Measurements: Not visualized. No adnexal mass seen. Other findings:  No abnormal free fluid IMPRESSION: Thickened, complex appearing endometrium with complex cystic area in the fundus. Endometrial thickness is considered abnormal for an asymptomatic post-menopausal female. Endometrial sampling should be considered to exclude carcinoma. Electronically Signed   By: Charlett Nose M.D.   On: 01/04/2017 12:13     ELIGIBLE FOR AVAILABLE RESEARCH PROTOCOL: no  ASSESSMENT: 61 y.o. [place] woman   (1) status post right lumpectomy and sentinel lymph node sampling October 2005 for a 0.6 cm invasive ductal carcinoma involving one out of 2 sentinel lymph nodes sampled, grade 3, triple-negative, treated adjuvantly with doxorubicin and cyclophosphamide 4 followed by weekly paclitaxel 7, followed by adjuvant radiation  RECURRENT DISEASE: (2) status post right breast upper outer quadrant biopsy and right axillary lymph node biopsy 10/01/2016, both positive for a T2 N1, stage IIIB invasive ductal carcinoma, triple negative, with an MIB-1 of 50-70%  (3) status post right modified radical mastectomy 11/05/2016 showing a pT2 pN1, stage IIIB invasive ductal carcinoma, grade 3, triple negative, with negative margins  (4) Not a candidate for radiation given prior history  (5) adjuvant chemotherapy will consist of carboplatin and gemcitabine given days 1  and 8 of each 21 day cycle, for 6 cycles, starting 11/20/2016  (a) day 1 cycle 2 omitted because of neutropenia; Neupogen/Neulasta added  (5) HIV positivity  PLAN: Luverta is doing well with her chemotherapy and we are proceeding with day 8 of cycle 1 today. She will receive on Pro with this treatment which should allow her counts to recover for the start of cycle for 2 weeks from now.  Her transvaginal ultrasound shows a markedly abnormal endometrium. We are referring her back to Dr. Shawnie Pons for further evaluation.  She is picking up fluid since her diuretic was stopped. I am starting her on Dyazide, low dose, and I have asked her to keep her weight at home.  She is benefiting from physical therapy to her right arm and is going to be following up with podiatry regarding her right toes. Otherwise she will return July 30 for the start of cycle 4.   Lowella Dell, MD   01/07/2017 5:39 PM Medical Oncology and Hematology [facility] [address] [place], [place] [address] Tel. [phone]    Fax. [phone]

## 2017-01-07 NOTE — Telephone Encounter (Signed)
-----   Message from Donnamae Jude, MD sent at 01/04/2017 12:28 PM EDT ----- Has thickened endometrium--needs either office EMB or to be scheduled for D and C with hysteroscopy--call patient to see which she would like.

## 2017-01-07 NOTE — Telephone Encounter (Signed)
Called pt, no answer, left message to call the office.  

## 2017-01-07 NOTE — Progress Notes (Signed)
Patient called earlier to inquire about financial assistance to help with mortgage. Asked patient if she could bring proof of income with her to her appointment today. She states she could.  Patient came in to bring proof of income. Patient is over income for the [employer]. Gave patient information on the [employer] who will have open funds in August. Advised patient if I find additional resources I will reach ut to her. Patient also verbalized she applied for foreclosure prevention program through the credit union and was over the requirements as well.  Patient given my card for any additional financial questions or concerns.

## 2017-01-07 NOTE — Patient Instructions (Signed)
[facility] Discharge Instructions for Patients Receiving Chemotherapy  Today you received the following chemotherapy agents: Gemzar and Carboplatin   To help prevent nausea and vomiting after your treatment, we encourage you to take your nausea medication as directed.    If you develop nausea and vomiting that is not controlled by your nausea medication, call the clinic.   BELOW ARE SYMPTOMS THAT SHOULD BE REPORTED IMMEDIATELY:  *FEVER GREATER THAN 100.5 F  *CHILLS WITH OR WITHOUT FEVER  NAUSEA AND VOMITING THAT IS NOT CONTROLLED WITH YOUR NAUSEA MEDICATION  *UNUSUAL SHORTNESS OF BREATH  *UNUSUAL BRUISING OR BLEEDING  TENDERNESS IN MOUTH AND THROAT WITH OR WITHOUT PRESENCE OF ULCERS  *URINARY PROBLEMS  *BOWEL PROBLEMS  UNUSUAL RASH Items with * indicate a potential emergency and should be followed up as soon as possible.  Feel free to call the clinic you have any questions or concerns. The clinic phone number is [phone].  Please show the [facility] at check-in to the Emergency Department and triage nurse.  Tbo-Filgrastim injection What is this medicine? TBO-FILGRASTIM (T B O fil GRA stim) is a granulocyte colony-stimulating factor that stimulates the growth of neutrophils, a type of white blood cell important in the body's fight against infection. It is used to reduce the incidence of fever and infection in patients with certain types of cancer who are receiving chemotherapy that affects the bone marrow. This medicine may be used for other purposes; ask your health care provider or pharmacist if you have questions. COMMON BRAND NAME(S): Granix What should I tell my health care provider before I take this medicine? They need to know if you have any of these conditions: -bone scan or tests planned -kidney disease -sickle cell anemia -an unusual or allergic reaction to tbo-filgrastim, filgrastim, pegfilgrastim, other medicines, foods, dyes, or  preservatives -pregnant or trying to get pregnant -breast-feeding How should I use this medicine? This medicine is for injection under the skin. If you get this medicine at home, you will be taught how to prepare and give this medicine. Refer to the Instructions for Use that come with your medication packaging. Use exactly as directed. Take your medicine at regular intervals. Do not take your medicine more often than directed. It is important that you put your used needles and syringes in a special sharps container. Do not put them in a trash can. If you do not have a sharps container, call your pharmacist or healthcare provider to get one. Talk to your pediatrician regarding the use of this medicine in children. Special care may be needed. Overdosage: If you think you have taken too much of this medicine contact a poison control center or emergency room at once. NOTE: This medicine is only for you. Do not share this medicine with others. What if I miss a dose? It is important not to miss your dose. Call your doctor or health care professional if you miss a dose. What may interact with this medicine? This medicine may interact with the following medications: -medicines that may cause a release of neutrophils, such as lithium This list may not describe all possible interactions. Give your health care provider a list of all the medicines, herbs, non-prescription drugs, or dietary supplements you use. Also tell them if you smoke, drink alcohol, or use illegal drugs. Some items may interact with your medicine. What should I watch for while using this medicine? You may need blood work done while you are taking this medicine. What  side effects may I notice from receiving this medicine? Side effects that you should report to your doctor or health care professional as soon as possible: -allergic reactions like skin rash, itching or hives, swelling of the face, lips, or tongue -blood in the urine -dark  urine -dizziness -fast heartbeat -feeling faint -shortness of breath or breathing problems -signs and symptoms of infection like fever or chills; cough; or sore throat -signs and symptoms of kidney injury like trouble passing urine or change in the amount of urine -stomach or side pain, or pain at the shoulder -sweating -swelling of the legs, ankles, or abdomen -tiredness Side effects that usually do not require medical attention (report to your doctor or health care professional if they continue or are bothersome): -bone pain -headache -muscle pain -vomiting This list may not describe all possible side effects. Call your doctor for medical advice about side effects. You may report side effects to FDA at 1-800-FDA-1088. Where should I keep my medicine? Keep out of the reach of children. Store in a refrigerator between 2 and 8 degrees C (36 and 46 degrees F). Keep in carton to protect from light. Throw away this medicine if it is left out of the refrigerator for more than 5 consecutive days. Throw away any unused medicine after the expiration date. NOTE: This sheet is a summary. It may not cover all possible information. If you have questions about this medicine, talk to your doctor, pharmacist, or health care provider.  2018 Elsevier/Gold Standard (2015-08-01 19:07:04)

## 2017-01-08 ENCOUNTER — Other Ambulatory Visit: Payer: Self-pay | Admitting: Pharmacist

## 2017-01-08 ENCOUNTER — Ambulatory Visit: Payer: Medicare HMO | Admitting: Physical Therapy

## 2017-01-08 ENCOUNTER — Telehealth: Payer: Self-pay | Admitting: Oncology

## 2017-01-08 ENCOUNTER — Telehealth: Payer: Self-pay

## 2017-01-08 ENCOUNTER — Telehealth: Payer: Self-pay | Admitting: *Deleted

## 2017-01-08 ENCOUNTER — Ambulatory Visit: Payer: Medicare HMO

## 2017-01-08 DIAGNOSIS — Z171 Estrogen receptor negative status [ER-]: Principal | ICD-10-CM

## 2017-01-08 DIAGNOSIS — M79601 Pain in right arm: Secondary | ICD-10-CM

## 2017-01-08 DIAGNOSIS — M25611 Stiffness of right shoulder, not elsewhere classified: Secondary | ICD-10-CM | POA: Diagnosis not present

## 2017-01-08 DIAGNOSIS — R600 Localized edema: Secondary | ICD-10-CM

## 2017-01-08 DIAGNOSIS — C50411 Malignant neoplasm of upper-outer quadrant of right female breast: Secondary | ICD-10-CM

## 2017-01-08 DIAGNOSIS — M6281 Muscle weakness (generalized): Secondary | ICD-10-CM | POA: Diagnosis not present

## 2017-01-08 LAB — T-HELPER CELLS (CD4) COUNT (NOT AT ARMC)
CD4 % Helper T Cell: 50 % (ref 33–55)
CD4 T CELL ABS: 1520 /uL (ref 400–2700)

## 2017-01-08 NOTE — Telephone Encounter (Signed)
Informed pt of results and recommendations.  Would like to proceed with the EMB that is less invasive at this time due to her currently battling breast cancer for the second time.  Will add to her next scheduled appt with Dr Kennon Rounds.

## 2017-01-08 NOTE — Telephone Encounter (Signed)
-----   Message from Donnamae Jude, MD sent at 01/04/2017 12:28 PM EDT ----- Has thickened endometrium--needs either office EMB or to be scheduled for D and C with hysteroscopy--call patient to see which she would like.

## 2017-01-08 NOTE — Therapy (Signed)
[place], [place], [address] Phone: [phone]   Fax:  [phone]  Physical Therapy Treatment  Patient Details  Name: Kayla Price MRN: [id number] Date of Birth: 10-16-1955 Referring Provider: Magrinat  Encounter Date: 01/08/2017      PT End of Session - 01/08/17 1733    PT Start Time --  pt arrived late      Past Medical History:  Diagnosis Date  . Alopecia areata 11/28/2009  . Bell's palsy   . BREAST CANCER, HX OF 2005  . Cancer [facility]) 2005   Breast cancer   chemotherapy and radiation  . CKD (chronic kidney disease) stage 3, GFR 30-59 ml/min 06/08/2015  . Dry eye syndrome   . Fasting hyperglycemia   . HIP FRACTURE, RIGHT 05/06/2008  . HIV (human immunodeficiency virus infection) ([place])   . HIV DISEASE 03/27/2006  . HYPERLIPIDEMIA, MIXED 12/15/2007  . HYPERTENSION 03/27/2006  . 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 MENORRHAGIA, POSTMENOPAUSAL 02/03/2009  . Osteoarthritis of left knee 06/08/2015  . OSTEOARTHROSIS, LOCAL, SCND, UNSPC SITE 04/07/2007  . PVD 04/07/2007  . Tinea capitis   . TRIGGER FINGER 05/06/2008  . Unspecified vitamin D deficiency 08/06/2007    Past Surgical History:  Procedure Laterality Date  . BREAST LUMPECTOMY Right    2005  . COLONOSCOPY    . HYSTEROSCOPY  2006  . KNEE ARTHROSCOPY Left 2002  . LYMPH NODE DISSECTION  2005  . MASTECTOMY MODIFIED RADICAL Right 11/05/2016   Procedure: RIGHT MASTECTOMY MODIFIED RADICAL;  Surgeon: Fanny Skates, MD;  Location: [place];  Service: General;  Laterality: Right;  . MODIFIED RADICAL MASTECTOMY Right 11/05/2016  . placement   of port-a-cath    . PORT-A-CATH REMOVAL  2006   insertion 2005  . PORTACATH PLACEMENT Right 11/05/2016   Procedure: INSERTION PORT-A-CATH WITH ULTRA SOUND;  Surgeon: Fanny Skates, MD;  Location: [place];  Service: General;  Laterality: Right;  . removal of port a cath  12/2014  . THYROID  SURGERY  2009   Ablation   . TOTAL KNEE ARTHROPLASTY Left 02/06/2016   Procedure: TOTAL KNEE ARTHROPLASTY;  Surgeon: Frederik Pear, MD;  Location: [place];  Service: Orthopedics;  Laterality: Left;  . TUBAL LIGATION      There were no vitals filed for this visit.      Subjective Assessment - 01/08/17 1724    Subjective Pt has been running around alot today.  Her neulasta patch fell off and she has been busy getting that worked out.    Pertinent History R breast cancer in 2005 followed by lumpectomy, SLNB, chemo and radiation, R breast cancer recurrence in 2018 followed by mastectomy 5/14/12018  and ALND,( 10 nodes removed) and one seroma drained.  and chemo, L TKA on 02/06/2016, HIV positive since 1993, arthritis   Patient Stated Goals to get my arm comfortable   Currently in Pain? Yes                         [facility] Adult PT Treatment/Exercise - 01/08/17 0001      Manual Therapy   Manual Lymphatic Drainage (MLD) Briefly, in supine, right shoulder collectors,( avoiding port) superficial and deep abdominal nodes,right inguinal nodes. right axillo-inguino anasamosis, right shoulder, upper arm, elbow and forearm with myofascial stretch to cording on forearm, return along pathways.  [facility] Goals - 01/01/17 1540      CC Long Term Goal  #1   Title Pt will report at least a 75% improvement in cording in RUE to allow for improved comfort.   Time 4   Period Weeks   Status On-going     CC Long Term Goal  #2   Title Pt will be independent in a home exercise program for continued strengthening and stretching of R shoulder   Time 4   Period Weeks   Status On-going     CC Long Term Goal  #3   Title Pt will be able to independently verbalize lymphedema risk reduction practices   Status Achieved     CC Long Term Goal  #4   Title Pt will obtain a compression sleeve to wear when exercising to help decrease risk of developing  lymphedema.   Time 4   Status On-going            Plan - 01/08/17 1731    Clinical Impression Statement Pt comes in late, but is wearing her sleeve and is happy that she got her compressiob bra.  She states it feels very comfortable  She has been wearing her sleeve.  The cording is palpable imrproved, but is still present especaily down into forearm    Clinical Impairments Affecting Rehab Potential pt undergoing chemo, past hx of radiation    PT Frequency 2x / week   PT Duration 4 weeks   PT Next Visit Plan MLD with focus on right lateral trunk and to try to help with cording in RUE.  D myofascial work to cording in [employer],    [employer] and Agree with Plan of Care Patient      Patient will benefit from skilled therapeutic intervention in order to improve the following deficits and impairments:     Visit Diagnosis: Pain in right arm  Stiffness of right shoulder, not elsewhere classified  Localized edema     Problem List Patient Active Problem List   Diagnosis Date Noted  . Recurrent cancer of right breast ([place]) 11/05/2016  . Malignant neoplasm of upper-outer quadrant of right breast in female, estrogen receptor negative ([place]) 10/09/2016  . Primary localized osteoarthritis of left knee 02/06/2016  . Primary osteoarthritis of left knee 02/05/2016  . Osteoarthritis of left knee 06/08/2015  . CKD (chronic kidney disease) stage 3, GFR 30-59 ml/min 06/08/2015  . Vitamin D deficiency 09/23/2014  . Acute renal insufficiency 04/23/2014  . 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 Bell's palsy 12/04/2012  . Pharyngitis 01/14/2012  . Contact dermatitis 08/29/2011  . Dry eyes 08/20/2011  . Dry mouth 08/20/2011  . Neuropathy 04/09/2011  . Insomnia 04/09/2011  . Obesities, morbid ([place]) 09/27/2010  . Endometrial polyp 12/22/2009  . ALOPECIA AREATA 11/28/2009  . ACUTE NASOPHARYNGITIS 07/18/2009  . MENORRHAGIA, POSTMENOPAUSAL 02/03/2009  . ABNORMAL GLANDULAR PAPANICOLAOU SMEAR OF  CERVIX 11/04/2008  . BROKEN TOOTH, WITH COMPLICATION 97/67/3419  . ALOPECIA 05/06/2008  . TRIGGER FINGER 05/06/2008  . HIP FRACTURE, RIGHT 05/06/2008  . ARTHROSCOPY, LEFT KNEE, HX OF 02/03/2008  . HYPERLIPIDEMIA, MIXED 12/15/2007  . HAND PAIN, RIGHT 12/15/2007  . Other abnormal glucose 12/15/2007  . UNSPECIFIED VITAMIN D DEFICIENCY 08/06/2007  . TINEA CAPITIS 08/04/2007  . CNTC DERMATITIS&OTH ECZEMA DUE OTH CHEM PRODUCTS 08/04/2007  . PVD 04/07/2007  . Secondary localized osteoarthrosis 04/07/2007  . Human immunodeficiency virus (HIV) disease ([place]) 03/27/2006  . Essential hypertension 03/27/2006  . BREAST CANCER, HX OF 03/27/2006  Donato Heinz. Owens Shark PT  Norwood Levo 01/08/2017, 5:33 PM  [place], [place], [address] Phone: [phone]   Fax:  [phone]  Name: Kayla Price MRN: [id number] Date of Birth: 1956/04/05

## 2017-01-08 NOTE — Telephone Encounter (Signed)
lvm to inform pt of inj appt 7/18 at 330

## 2017-01-08 NOTE — Telephone Encounter (Signed)
Pt called stating that her Onpro fell off "sometime in the middle of the night".  Pt states that the green light is still on and that the devices is still marked as full.  Pt instructed to return  device when she comes in for injection.  Pt informed that she will need to come in for a neulasta injection this afternoon after 3:30 or tomorrow.  Msg has been sent to schedulers to call pt with appt time.  Pt verbalizes understanding of above instructions

## 2017-01-09 ENCOUNTER — Ambulatory Visit (HOSPITAL_BASED_OUTPATIENT_CLINIC_OR_DEPARTMENT_OTHER): Payer: Medicare HMO

## 2017-01-09 ENCOUNTER — Ambulatory Visit: Payer: Medicare HMO

## 2017-01-09 ENCOUNTER — Ambulatory Visit: Payer: Medicare HMO | Admitting: Physical Therapy

## 2017-01-09 VITALS — BP 142/85 | HR 76 | Temp 97.5°F

## 2017-01-09 DIAGNOSIS — M79601 Pain in right arm: Secondary | ICD-10-CM

## 2017-01-09 DIAGNOSIS — Z5189 Encounter for other specified aftercare: Secondary | ICD-10-CM | POA: Diagnosis not present

## 2017-01-09 DIAGNOSIS — C773 Secondary and unspecified malignant neoplasm of axilla and upper limb lymph nodes: Secondary | ICD-10-CM

## 2017-01-09 DIAGNOSIS — R600 Localized edema: Secondary | ICD-10-CM | POA: Diagnosis not present

## 2017-01-09 DIAGNOSIS — Z171 Estrogen receptor negative status [ER-]: Principal | ICD-10-CM

## 2017-01-09 DIAGNOSIS — C50411 Malignant neoplasm of upper-outer quadrant of right female breast: Secondary | ICD-10-CM

## 2017-01-09 DIAGNOSIS — Z0100 Encounter for examination of eyes and vision without abnormal findings: Secondary | ICD-10-CM | POA: Diagnosis not present

## 2017-01-09 DIAGNOSIS — M6281 Muscle weakness (generalized): Secondary | ICD-10-CM | POA: Diagnosis not present

## 2017-01-09 DIAGNOSIS — M25611 Stiffness of right shoulder, not elsewhere classified: Secondary | ICD-10-CM

## 2017-01-09 MED ORDER — FILGRASTIM 480 MCG/0.8ML IJ SOSY
480.0000 ug | PREFILLED_SYRINGE | Freq: Once | INTRAMUSCULAR | Status: AC
  Administered 2017-01-09: 480 ug via SUBCUTANEOUS
  Filled 2017-01-09: qty 0.8

## 2017-01-09 NOTE — Patient Instructions (Signed)
Tbo-Filgrastim injection What is this medicine? TBO-FILGRASTIM (T B O fil GRA stim) is a granulocyte colony-stimulating factor that stimulates the growth of neutrophils, a type of white blood cell important in the body's fight against infection. It is used to reduce the incidence of fever and infection in patients with certain types of cancer who are receiving chemotherapy that affects the bone marrow. This medicine may be used for other purposes; ask your health care provider or pharmacist if you have questions. COMMON BRAND NAME(S): Granix What should I tell my health care provider before I take this medicine? They need to know if you have any of these conditions: -bone scan or tests planned -kidney disease -sickle cell anemia -an unusual or allergic reaction to tbo-filgrastim, filgrastim, pegfilgrastim, other medicines, foods, dyes, or preservatives -pregnant or trying to get pregnant -breast-feeding How should I use this medicine? This medicine is for injection under the skin. If you get this medicine at home, you will be taught how to prepare and give this medicine. Refer to the Instructions for Use that come with your medication packaging. Use exactly as directed. Take your medicine at regular intervals. Do not take your medicine more often than directed. It is important that you put your used needles and syringes in a special sharps container. Do not put them in a trash can. If you do not have a sharps container, call your pharmacist or healthcare provider to get one. Talk to your pediatrician regarding the use of this medicine in children. Special care may be needed. Overdosage: If you think you have taken too much of this medicine contact a poison control center or emergency room at once. NOTE: This medicine is only for you. Do not share this medicine with others. What if I miss a dose? It is important not to miss your dose. Call your doctor or health care professional if you miss a  dose. What may interact with this medicine? This medicine may interact with the following medications: -medicines that may cause a release of neutrophils, such as lithium This list may not describe all possible interactions. Give your health care provider a list of all the medicines, herbs, non-prescription drugs, or dietary supplements you use. Also tell them if you smoke, drink alcohol, or use illegal drugs. Some items may interact with your medicine. What should I watch for while using this medicine? You may need blood work done while you are taking this medicine. What side effects may I notice from receiving this medicine? Side effects that you should report to your doctor or health care professional as soon as possible: -allergic reactions like skin rash, itching or hives, swelling of the face, lips, or tongue -blood in the urine -dark urine -dizziness -fast heartbeat -feeling faint -shortness of breath or breathing problems -signs and symptoms of infection like fever or chills; cough; or sore throat -signs and symptoms of kidney injury like trouble passing urine or change in the amount of urine -stomach or side pain, or pain at the shoulder -sweating -swelling of the legs, ankles, or abdomen -tiredness Side effects that usually do not require medical attention (report to your doctor or health care professional if they continue or are bothersome): -bone pain -headache -muscle pain -vomiting This list may not describe all possible side effects. Call your doctor for medical advice about side effects. You may report side effects to FDA at 1-800-FDA-1088. Where should I keep my medicine? Keep out of the reach of children. Store in a refrigerator between   2 and 8 degrees C (36 and 46 degrees F). Keep in carton to protect from light. Throw away this medicine if it is left out of the refrigerator for more than 5 consecutive days. Throw away any unused medicine after the expiration  date. NOTE: This sheet is a summary. It may not cover all possible information. If you have questions about this medicine, talk to your doctor, pharmacist, or health care provider.  2018 Elsevier/Gold Standard (2015-08-01 19:07:04)  

## 2017-01-09 NOTE — Therapy (Signed)
[place], [place], [address] Phone: [phone]   Fax:  [phone]  Physical Therapy Treatment  Patient Details  Name: Kayla Price MRN: [id number] Date of Birth: [birth year] Referring Provider: Magrinat  Encounter Date: 01/09/2017      PT End of Session - 01/09/17 9678    Visit Number 5   Number of Visits 9   Date for PT Re-Evaluation 01/24/17   PT Start Time 9381   PT Stop Time 1433   PT Time Calculation (min) 48 min   Activity Tolerance Patient tolerated treatment well   Behavior During Therapy [facility] for tasks assessed/performed      Past Medical History:  Diagnosis Date  . Alopecia areata 11/28/2009  . Bell's palsy   . BREAST CANCER, HX OF 2005  . Cancer [facility]) 2005   Breast cancer   chemotherapy and radiation  . CKD (chronic kidney disease) stage 3, GFR 30-59 ml/min 06/08/2015  . Dry eye syndrome   . Fasting hyperglycemia   . HIP FRACTURE, RIGHT 05/06/2008  . HIV (human immunodeficiency virus infection) ([place])   . HIV DISEASE 03/27/2006  . HYPERLIPIDEMIA, MIXED 12/15/2007  . HYPERTENSION 03/27/2006  . 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 MENORRHAGIA, POSTMENOPAUSAL 02/03/2009  . Osteoarthritis of left knee 06/08/2015  . OSTEOARTHROSIS, LOCAL, SCND, UNSPC SITE 04/07/2007  . PVD 04/07/2007  . Tinea capitis   . TRIGGER FINGER 05/06/2008  . Unspecified vitamin D deficiency 08/06/2007    Past Surgical History:  Procedure Laterality Date  . BREAST LUMPECTOMY Right    2005  . COLONOSCOPY    . HYSTEROSCOPY  2006  . KNEE ARTHROSCOPY Left 2002  . LYMPH NODE DISSECTION  2005  . MASTECTOMY MODIFIED RADICAL Right 11/05/2016   Procedure: RIGHT MASTECTOMY MODIFIED RADICAL;  Surgeon: Fanny Skates, MD;  Location: [place];  Service: General;  Laterality: Right;  . MODIFIED RADICAL MASTECTOMY Right 11/05/2016  . placement   of port-a-cath    . PORT-A-CATH REMOVAL  2006   insertion 2005   . PORTACATH PLACEMENT Right 11/05/2016   Procedure: INSERTION PORT-A-CATH WITH ULTRA SOUND;  Surgeon: Fanny Skates, MD;  Location: [place];  Service: General;  Laterality: Right;  . removal of port a cath  12/2014  . THYROID SURGERY  2009   Ablation   . TOTAL KNEE ARTHROPLASTY Left 02/06/2016   Procedure: TOTAL KNEE ARTHROPLASTY;  Surgeon: Frederik Pear, MD;  Location: [place];  Service: Orthopedics;  Laterality: Left;  . TUBAL LIGATION      There were no vitals filed for this visit.      Subjective Assessment - 01/09/17 1347    Subjective About the compression sleeve:  "It works!"  --the cording is a lot better.   Currently in Pain? No/denies            [facility] PT Assessment - 01/09/17 0001      Assessment   Medical Diagnosis                       [facility] Adult PT Treatment/Exercise - 01/09/17 0001      Shoulder Exercises: Stretch   Other Shoulder Stretches instructed patient and had her perform doorway stretch with arms raised in a "V" and hands on door jamb, then stepping forward into doorway.  Instructed to hold 30 seconds if she could.     Manual Therapy   Manual Therapy Soft tissue mobilization;Myofascial release;Passive ROM;Neural Stretch   Soft tissue  [place], [place], [address] Phone: [phone]   Fax:  [phone]  Physical Therapy Treatment  Patient Details  Name: Kayla Price MRN: [id number] Date of Birth: [birth year] Referring Provider: Magrinat  Encounter Date: 01/09/2017      PT End of Session - 01/09/17 9678    Visit Number 5   Number of Visits 9   Date for PT Re-Evaluation 01/24/17   PT Start Time 9381   PT Stop Time 1433   PT Time Calculation (min) 48 min   Activity Tolerance Patient tolerated treatment well   Behavior During Therapy [facility] for tasks assessed/performed      Past Medical History:  Diagnosis Date  . Alopecia areata 11/28/2009  . Bell's palsy   . BREAST CANCER, HX OF 2005  . Cancer [facility]) 2005   Breast cancer   chemotherapy and radiation  . CKD (chronic kidney disease) stage 3, GFR 30-59 ml/min 06/08/2015  . Dry eye syndrome   . Fasting hyperglycemia   . HIP FRACTURE, RIGHT 05/06/2008  . HIV (human immunodeficiency virus infection) ([place])   . HIV DISEASE 03/27/2006  . HYPERLIPIDEMIA, MIXED 12/15/2007  . HYPERTENSION 03/27/2006  . 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 MENORRHAGIA, POSTMENOPAUSAL 02/03/2009  . Osteoarthritis of left knee 06/08/2015  . OSTEOARTHROSIS, LOCAL, SCND, UNSPC SITE 04/07/2007  . PVD 04/07/2007  . Tinea capitis   . TRIGGER FINGER 05/06/2008  . Unspecified vitamin D deficiency 08/06/2007    Past Surgical History:  Procedure Laterality Date  . BREAST LUMPECTOMY Right    2005  . COLONOSCOPY    . HYSTEROSCOPY  2006  . KNEE ARTHROSCOPY Left 2002  . LYMPH NODE DISSECTION  2005  . MASTECTOMY MODIFIED RADICAL Right 11/05/2016   Procedure: RIGHT MASTECTOMY MODIFIED RADICAL;  Surgeon: Fanny Skates, MD;  Location: [place];  Service: General;  Laterality: Right;  . MODIFIED RADICAL MASTECTOMY Right 11/05/2016  . placement   of port-a-cath    . PORT-A-CATH REMOVAL  2006   insertion 2005   . PORTACATH PLACEMENT Right 11/05/2016   Procedure: INSERTION PORT-A-CATH WITH ULTRA SOUND;  Surgeon: Fanny Skates, MD;  Location: [place];  Service: General;  Laterality: Right;  . removal of port a cath  12/2014  . THYROID SURGERY  2009   Ablation   . TOTAL KNEE ARTHROPLASTY Left 02/06/2016   Procedure: TOTAL KNEE ARTHROPLASTY;  Surgeon: Frederik Pear, MD;  Location: [place];  Service: Orthopedics;  Laterality: Left;  . TUBAL LIGATION      There were no vitals filed for this visit.      Subjective Assessment - 01/09/17 1347    Subjective About the compression sleeve:  "It works!"  --the cording is a lot better.   Currently in Pain? No/denies            [facility] PT Assessment - 01/09/17 0001      Assessment   Medical Diagnosis                       [facility] Adult PT Treatment/Exercise - 01/09/17 0001      Shoulder Exercises: Stretch   Other Shoulder Stretches instructed patient and had her perform doorway stretch with arms raised in a "V" and hands on door jamb, then stepping forward into doorway.  Instructed to hold 30 seconds if she could.     Manual Therapy   Manual Therapy Soft tissue mobilization;Myofascial release;Passive ROM;Neural Stretch   Soft tissue  08/04/2007  . CNTC DERMATITIS&OTH ECZEMA DUE OTH CHEM PRODUCTS 08/04/2007  . PVD 04/07/2007  . Secondary localized osteoarthrosis 04/07/2007  . Human immunodeficiency virus (HIV) disease ([place]) 03/27/2006  . Essential hypertension 03/27/2006  . BREAST CANCER, HX OF 03/27/2006    Andrei Mccook 01/09/2017, 4:51 PM  [place], [place], [address] Phone: [phone]   Fax:  [phone]  Name: SUZANE VANDERWEIDE MRN: [id number] Date of Birth: [birth year]  Serafina Royals, PT 01/09/17 4:51 PM

## 2017-01-14 DIAGNOSIS — C50911 Malignant neoplasm of unspecified site of right female breast: Secondary | ICD-10-CM | POA: Diagnosis not present

## 2017-01-15 ENCOUNTER — Ambulatory Visit: Payer: Medicare HMO | Admitting: Physical Therapy

## 2017-01-15 DIAGNOSIS — M25611 Stiffness of right shoulder, not elsewhere classified: Secondary | ICD-10-CM

## 2017-01-15 DIAGNOSIS — R600 Localized edema: Secondary | ICD-10-CM | POA: Diagnosis not present

## 2017-01-15 DIAGNOSIS — M6281 Muscle weakness (generalized): Secondary | ICD-10-CM

## 2017-01-15 DIAGNOSIS — M79601 Pain in right arm: Secondary | ICD-10-CM

## 2017-01-16 DIAGNOSIS — C50911 Malignant neoplasm of unspecified site of right female breast: Secondary | ICD-10-CM | POA: Diagnosis not present

## 2017-01-16 NOTE — Therapy (Signed)
[place], [place], [address] Phone: [phone]   Fax:  [phone]  Physical Therapy Treatment  Patient Details  Name: Kayla Price MRN: [id number] Date of Birth: 1955-10-31 Referring Provider: Magrinat  Encounter Date: 01/15/2017      PT End of Session - 01/16/17 0800    Visit Number 6   Number of Visits 9   Date for PT Re-Evaluation 01/24/17   PT Start Time 6568   PT Stop Time 1430   PT Time Calculation (min) 45 min   Activity Tolerance Patient tolerated treatment well   Behavior During Therapy [facility] for tasks assessed/performed      Past Medical History:  Diagnosis Date  . Alopecia areata 11/28/2009  . Bell's palsy   . BREAST CANCER, HX OF 2005  . Cancer [facility]) 2005   Breast cancer   chemotherapy and radiation  . CKD (chronic kidney disease) stage 3, GFR 30-59 ml/min 06/08/2015  . Dry eye syndrome   . Fasting hyperglycemia   . HIP FRACTURE, RIGHT 05/06/2008  . HIV (human immunodeficiency virus infection) ([place])   . HIV DISEASE 03/27/2006  . HYPERLIPIDEMIA, MIXED 12/15/2007  . HYPERTENSION 03/27/2006  . 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 MENORRHAGIA, POSTMENOPAUSAL 02/03/2009  . Osteoarthritis of left knee 06/08/2015  . OSTEOARTHROSIS, LOCAL, SCND, UNSPC SITE 04/07/2007  . PVD 04/07/2007  . Tinea capitis   . TRIGGER FINGER 05/06/2008  . Unspecified vitamin D deficiency 08/06/2007    Past Surgical History:  Procedure Laterality Date  . BREAST LUMPECTOMY Right    2005  . COLONOSCOPY    . HYSTEROSCOPY  2006  . KNEE ARTHROSCOPY Left 2002  . LYMPH NODE DISSECTION  2005  . MASTECTOMY MODIFIED RADICAL Right 11/05/2016   Procedure: RIGHT MASTECTOMY MODIFIED RADICAL;  Surgeon: Fanny Skates, MD;  Location: [place];  Service: General;  Laterality: Right;  . MODIFIED RADICAL MASTECTOMY Right 11/05/2016  . placement   of port-a-cath    . PORT-A-CATH REMOVAL  2006   insertion 2005   . PORTACATH PLACEMENT Right 11/05/2016   Procedure: INSERTION PORT-A-CATH WITH ULTRA SOUND;  Surgeon: Fanny Skates, MD;  Location: [place];  Service: General;  Laterality: Right;  . removal of port a cath  12/2014  . THYROID SURGERY  2009   Ablation   . TOTAL KNEE ARTHROPLASTY Left 02/06/2016   Procedure: TOTAL KNEE ARTHROPLASTY;  Surgeon: Frederik Pear, MD;  Location: [place];  Service: Orthopedics;  Laterality: Left;  . TUBAL LIGATION      There were no vitals filed for this visit.      Subjective Assessment - 01/15/17 1348    Subjective I had to start taking gabapentin every day because she is starting to feel some muscle aches. No other pain    Pertinent History R breast cancer in 2005 followed by lumpectomy, SLNB, chemo and radiation, R breast cancer recurrence in 2018 followed by mastectomy 5/14/12018  and ALND,( 10 nodes removed) and one seroma drained.  and chemo, L TKA on 02/06/2016, HIV positive since 1993, arthritis   Patient Stated Goals to get my arm comfortable                         [facility] Adult PT Treatment/Exercise - 01/16/17 0001      Shoulder Exercises: [place]   Flexion 2 minutes     Manual Therapy   Manual therapy comments cording perceived visibly and palpably in right lower  arm. Focused attention on theis area with attempts to stretch in mulitple directions. Did not feel any sensations of release    Manual Lymphatic Drainage (MLD) Briefly, in supine, right shoulder collectors,( avoiding port) superficial and deep abdominal nodes,right inguinal nodes. right axillo-inguino anasamosis, right shoulder, upper arm, elbow and forearm with myofascial stretch to cording on forearm, return along pathways.    Neural Stretch for right UE                        [facility] Goals - 01/09/17 1355      CC Long Term Goal  #4   Title Pt will obtain a compression sleeve to wear when exercising to help decrease risk of developing lymphedema.    Status Achieved            Plan - 01/16/17 0800    Clinical Impression Statement Pt continues to do hours of exercise at the gym several times a week and uses her sleeve while she is there.  She feels that this is helping her cording, though it is still present and she feels the tightness. She would like to learn more exercise that she can do at home    Rehab Potential Good   Clinical Impairments Affecting Rehab Potential pt undergoing chemo, past hx of radiation    PT Frequency 2x / week   PT Duration 4 weeks   PT Next Visit Plan Work on tightness at right shoulder/chest  Teach supine scapular series and progression    Consulted and Agree with Plan of Care Patient      Patient will benefit from skilled therapeutic intervention in order to improve the following deficits and impairments:     Visit Diagnosis: Pain in right arm  Stiffness of right shoulder, not elsewhere classified  Localized edema  Muscle weakness (generalized)     Problem List Patient Active Problem List   Diagnosis Date Noted  . Recurrent cancer of right breast ([place]) 11/05/2016  . Malignant neoplasm of upper-outer quadrant of right breast in female, estrogen receptor negative ([place]) 10/09/2016  . Primary localized osteoarthritis of left knee 02/06/2016  . Primary osteoarthritis of left knee 02/05/2016  . Osteoarthritis of left knee 06/08/2015  . CKD (chronic kidney disease) stage 3, GFR 30-59 ml/min 06/08/2015  . Vitamin D deficiency 09/23/2014  . Acute renal insufficiency 04/23/2014  . 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 Bell's palsy 12/04/2012  . Pharyngitis 01/14/2012  . Contact dermatitis 08/29/2011  . Dry eyes 08/20/2011  . Dry mouth 08/20/2011  . Neuropathy 04/09/2011  . Insomnia 04/09/2011  . Obesities, morbid ([place]) 09/27/2010  . Endometrial polyp 12/22/2009  . ALOPECIA AREATA 11/28/2009  . ACUTE NASOPHARYNGITIS 07/18/2009  . MENORRHAGIA, POSTMENOPAUSAL 02/03/2009  . ABNORMAL  GLANDULAR PAPANICOLAOU SMEAR OF CERVIX 11/04/2008  . BROKEN TOOTH, WITH COMPLICATION 95/62/1308  . ALOPECIA 05/06/2008  . TRIGGER FINGER 05/06/2008  . HIP FRACTURE, RIGHT 05/06/2008  . ARTHROSCOPY, LEFT KNEE, HX OF 02/03/2008  . HYPERLIPIDEMIA, MIXED 12/15/2007  . HAND PAIN, RIGHT 12/15/2007  . Other abnormal glucose 12/15/2007  . UNSPECIFIED VITAMIN D DEFICIENCY 08/06/2007  . TINEA CAPITIS 08/04/2007  . CNTC DERMATITIS&OTH ECZEMA DUE OTH CHEM PRODUCTS 08/04/2007  . PVD 04/07/2007  . Secondary localized osteoarthrosis 04/07/2007  . Human immunodeficiency virus (HIV) disease ([place]) 03/27/2006  . Essential hypertension 03/27/2006  . BREAST CANCER, HX OF 03/27/2006   Donato Heinz. Owens Shark, PT  Norwood Levo 01/16/2017, 8:03 AM  [facility]  [place], [place], [address] Phone: [phone]   Fax:  [phone]  Name: Kayla Price MRN: [id number] Date of Birth: [birth year]

## 2017-01-17 ENCOUNTER — Ambulatory Visit: Payer: Medicare HMO | Admitting: Physical Therapy

## 2017-01-17 DIAGNOSIS — M6281 Muscle weakness (generalized): Secondary | ICD-10-CM

## 2017-01-17 DIAGNOSIS — M25611 Stiffness of right shoulder, not elsewhere classified: Secondary | ICD-10-CM | POA: Diagnosis not present

## 2017-01-17 DIAGNOSIS — R600 Localized edema: Secondary | ICD-10-CM | POA: Diagnosis not present

## 2017-01-17 DIAGNOSIS — M79601 Pain in right arm: Secondary | ICD-10-CM

## 2017-01-17 NOTE — Patient Instructions (Signed)

## 2017-01-17 NOTE — Therapy (Signed)
[place], [place], [address] Phone: [phone]   Fax:  [phone]  Physical Therapy Treatment  Patient Details  Name: Kayla Price MRN: [id number] Date of Birth: Jul 29, 1955 Referring Provider: Magrinat  Encounter Date: 01/17/2017      PT End of Session - 01/17/17 1436    Visit Number 7   Number of Visits 9   Date for PT Re-Evaluation 01/24/17   PT Start Time 1350   PT Stop Time 1430   PT Time Calculation (min) 40 min   Activity Tolerance Patient tolerated treatment well   Behavior During Therapy [facility] for tasks assessed/performed      Past Medical History:  Diagnosis Date  . Alopecia areata 11/28/2009  . Bell's palsy   . BREAST CANCER, HX OF 2005  . Cancer [facility]) 2005   Breast cancer   chemotherapy and radiation  . CKD (chronic kidney disease) stage 3, GFR 30-59 ml/min 06/08/2015  . Dry eye syndrome   . Fasting hyperglycemia   . HIP FRACTURE, RIGHT 05/06/2008  . HIV (human immunodeficiency virus infection) ([place])   . HIV DISEASE 03/27/2006  . HYPERLIPIDEMIA, MIXED 12/15/2007  . HYPERTENSION 03/27/2006  . 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 MENORRHAGIA, POSTMENOPAUSAL 02/03/2009  . Osteoarthritis of left knee 06/08/2015  . OSTEOARTHROSIS, LOCAL, SCND, UNSPC SITE 04/07/2007  . PVD 04/07/2007  . Tinea capitis   . TRIGGER FINGER 05/06/2008  . Unspecified vitamin D deficiency 08/06/2007    Past Surgical History:  Procedure Laterality Date  . BREAST LUMPECTOMY Right    2005  . COLONOSCOPY    . HYSTEROSCOPY  2006  . KNEE ARTHROSCOPY Left 2002  . LYMPH NODE DISSECTION  2005  . MASTECTOMY MODIFIED RADICAL Right 11/05/2016   Procedure: RIGHT MASTECTOMY MODIFIED RADICAL;  Surgeon: Fanny Skates, MD;  Location: [place];  Service: General;  Laterality: Right;  . MODIFIED RADICAL MASTECTOMY Right 11/05/2016  . placement   of port-a-cath    . PORT-A-CATH REMOVAL  2006   insertion 2005   . PORTACATH PLACEMENT Right 11/05/2016   Procedure: INSERTION PORT-A-CATH WITH ULTRA SOUND;  Surgeon: Fanny Skates, MD;  Location: [place];  Service: General;  Laterality: Right;  . removal of port a cath  12/2014  . THYROID SURGERY  2009   Ablation   . TOTAL KNEE ARTHROPLASTY Left 02/06/2016   Procedure: TOTAL KNEE ARTHROPLASTY;  Surgeon: Frederik Pear, MD;  Location: [place];  Service: Orthopedics;  Laterality: Left;  . TUBAL LIGATION      There were no vitals filed for this visit.      Subjective Assessment - 01/17/17 1355    Subjective Pt states the cording is getting better.  She comes in wearing her sleeve and gauntlet    Pertinent History R breast cancer in 2005 followed by lumpectomy, SLNB, chemo and radiation, R breast cancer recurrence in 2018 followed by mastectomy 5/14/12018  and ALND,( 10 nodes removed) and one seroma drained.  and chemo, L TKA on 02/06/2016, HIV positive since 1993, arthritis   Patient Stated Goals to get my arm comfortable   Currently in Pain? No/denies                         [facility] Adult PT Treatment/Exercise - 01/17/17 0001      Shoulder Exercises: Supine   Horizontal ABduction Strengthening;Both;5 reps;Theraband   Theraband Level (Shoulder Horizontal ABduction) Level 1 (Yellow)   External Rotation Strengthening;Both;5 reps;Theraband  Theraband Level (Shoulder External Rotation) Level 1 (Yellow)   Flexion AAROM;Both;5 reps;Theraband   Theraband Level (Shoulder Flexion) Level 1 (Yellow)   Flexion Limitations wide and narrow grip , also in sitting and standing    Other Supine Exercises diagonal elevation with yello band 5 reps    Other Supine Exercises instructed in progression of these exercises from yellow to red and and green bands when ready and from supine to sit to stand      Shoulder Exercises: Sidelying   ABduction AROM;Right;10 reps   Other Sidelying Exercises small circles pointed to the ceilings    Other Sidelying Exercises  from left sidelying, horizontal abduction and thoracic rotation for stretch in anterior chest      Manual Therapy   Manual therapy comments cording much improved today at forearm stil mildly present in axilla and anterior chest    Manual Lymphatic Drainage (MLD) Briefly, in supine, right shoulder collectors,( avoiding port) superficial and deep abdominal nodes,right inguinal nodes. right axillo-inguino anasamosis, right shoulder, upper arm, elbow and forearm with myofascial stretch to cording on forearm, return along pathways.                         [facility] Goals - 01/09/17 1355      CC Long Term Goal  #4   Title Pt will obtain a compression sleeve to wear when exercising to help decrease risk of developing lymphedema.   Status Achieved            Plan - 01/17/17 1436    Clinical Impression Statement Pt continues to improve.  Noticeably less cording in right forearm today after increase wear time of sleeve.  Upgraded program to include home strengthening program to RUE.  Pt able to perform exercise and knows how to advance.  She is on track to discharge at end of authorization period next week.    Rehab Potential Good   Clinical Impairments Affecting Rehab Potential pt undergoing chemo, past hx of radiation    PT Frequency 2x / week   PT Duration 4 weeks   PT Next Visit Plan Work on tightness at right shoulder/chest  Review supine scapular series and progression and answer questions.  Progress strenthening for pt to do at the [facility]. Prepare for discharge next week    Consulted and Agree with Plan of Care Patient      Patient will benefit from skilled therapeutic intervention in order to improve the following deficits and impairments:  Increased fascial restricitons, Decreased knowledge of precautions, Increased edema, Decreased strength, Impaired UE functional use, Pain, Decreased scar mobility  Visit Diagnosis: Pain in right arm  Stiffness of right  shoulder, not elsewhere classified  Localized edema  Muscle weakness (generalized)     Problem List Patient Active Problem List   Diagnosis Date Noted  . Recurrent cancer of right breast ([place]) 11/05/2016  . Malignant neoplasm of upper-outer quadrant of right breast in female, estrogen receptor negative ([place]) 10/09/2016  . Primary localized osteoarthritis of left knee 02/06/2016  . Primary osteoarthritis of left knee 02/05/2016  . Osteoarthritis of left knee 06/08/2015  . CKD (chronic kidney disease) stage 3, GFR 30-59 ml/min 06/08/2015  . Vitamin D deficiency 09/23/2014  . Acute renal insufficiency 04/23/2014  . 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 Bell's palsy 12/04/2012  . Pharyngitis 01/14/2012  . Contact dermatitis 08/29/2011  . Dry eyes 08/20/2011  . Dry mouth 08/20/2011  . Neuropathy 04/09/2011  .  Insomnia 04/09/2011  . Obesities, morbid ([place]) 09/27/2010  . Endometrial polyp 12/22/2009  . ALOPECIA AREATA 11/28/2009  . ACUTE NASOPHARYNGITIS 07/18/2009  . MENORRHAGIA, POSTMENOPAUSAL 02/03/2009  . ABNORMAL GLANDULAR PAPANICOLAOU SMEAR OF CERVIX 11/04/2008  . BROKEN TOOTH, WITH COMPLICATION 33/43/5686  . ALOPECIA 05/06/2008  . TRIGGER FINGER 05/06/2008  . HIP FRACTURE, RIGHT 05/06/2008  . ARTHROSCOPY, LEFT KNEE, HX OF 02/03/2008  . HYPERLIPIDEMIA, MIXED 12/15/2007  . HAND PAIN, RIGHT 12/15/2007  . Other abnormal glucose 12/15/2007  . UNSPECIFIED VITAMIN D DEFICIENCY 08/06/2007  . TINEA CAPITIS 08/04/2007  . CNTC DERMATITIS&OTH ECZEMA DUE OTH CHEM PRODUCTS 08/04/2007  . PVD 04/07/2007  . Secondary localized osteoarthrosis 04/07/2007  . Human immunodeficiency virus (HIV) disease ([place]) 03/27/2006  . Essential hypertension 03/27/2006  . BREAST CANCER, HX OF 03/27/2006   Donato Heinz. Owens Shark PT  Norwood Levo 01/17/2017, 2:40 PM  [place], [place], [address] Phone: [phone]    Fax:  [phone]  Name: Kayla Price MRN: [id number] Date of Birth: 01/09/1956

## 2017-01-20 NOTE — Progress Notes (Signed)
[place]  [phone] [phone]     ID: MARGRIT MINNER DOB: 08-Jul-1955  MR#: [id number]  GYF#:[id number]  Patient Care Team: Lauree Chandler, NP as PCP - General (Nurse Practitioner) Tommy Medal, Lavell Islam, MD as PCP - Infectious Diseases (Infectious Diseases) Clent Jacks, MD as Consulting Physician (Ophthalmology) Renato Shin, MD as Consulting Physician (Endocrinology) Frederik Pear, MD as Consulting Physician (Orthopedic Surgery) Magrinat, Virgie Dad, MD as Consulting Physician (Oncology) Fanny Skates, MD as Consulting Physician (General Surgery) Donnamae Jude, MD as Consulting Physician (Obstetrics and Gynecology) Scot Dock, NP OTHER MD:  CHIEF COMPLAINT: Triple negative breast cancer, recurrent  CURRENT TREATMENT: Adjuvant chemotherapy  INTERVAL HISTORY: Shaiann is here today for evaluation prior to receiving her fourth cycle of adjuvant Gemcitabine and Carboplatin.  She receives this on days 1 and days 8 of a 21 day cycle.  Due to neutropenia, she receives neupogen on days 2-4, and Neulasta on day 9.  She is doing well today and does not have any questions or concerns about her treatment.    REVIEW OF SYSTEMS: Meggen has some mild constipation, but that has largely resolved.  Otherwise a detailed ROS is non contributory.    BREAST CANCER HISTORY: From the original intake note:  Renesha is a history of right-sided breast cancer dating back to 2005. She had a lumpectomy with sentinel lymph node sampling, chemotherapy, and radiation. I do not have access to those records at present.  More recently she had bilateral screening mammography at the [facility] 09/25/2016 showing a possible mass in the right breast. Diagnostic mammography with ultrasonography on 09/28/2016 the patient underwent right diagnostic mammography with tomography and right breast ultrasonography. The breast density was category A. In the right breast  at the 10:00 position there was an irregular mass measuring 2.5 cm. Ultrasound identified this the 10:00 radiant 10 cm from the nipple measuring 2.4 cm. In the right axilla there was an abnormal lymph node measuring 1.3 cm with other normal-appearing lymph nodes.  On 10/01/2016 she  underwent biopsy of the right breast mass in question as well as the suspicious axillary lymph node. 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 The proliferation marker was 70% in the breast lesion and 50% in the lymph node.  Her subsequent history is as detailed below.   PAST MEDICAL HISTORY: Past Medical History:  Diagnosis Date  . Alopecia areata 11/28/2009  . Bell's palsy   . BREAST CANCER, HX OF 2005  . Cancer [facility]) 2005   Breast cancer   chemotherapy and radiation  . CKD (chronic kidney disease) stage 3, GFR 30-59 ml/min 06/08/2015  . Dry eye syndrome   . Fasting hyperglycemia   . HIP FRACTURE, RIGHT 05/06/2008  . HIV (human immunodeficiency virus infection) ([place])   . HIV DISEASE 03/27/2006  . HYPERLIPIDEMIA, MIXED 12/15/2007  . HYPERTENSION 03/27/2006  . 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 MENORRHAGIA, POSTMENOPAUSAL 02/03/2009  . Osteoarthritis of left knee 06/08/2015  . OSTEOARTHROSIS, LOCAL, SCND, UNSPC SITE 04/07/2007  . PVD 04/07/2007  . Tinea capitis   . TRIGGER FINGER 05/06/2008  . Unspecified vitamin D deficiency 08/06/2007    PAST SURGICAL HISTORY: Past Surgical History:  Procedure Laterality Date  . BREAST LUMPECTOMY Right    2005  . COLONOSCOPY    . HYSTEROSCOPY  2006  . KNEE ARTHROSCOPY Left 2002  .  LYMPH NODE DISSECTION  2005  . MASTECTOMY MODIFIED RADICAL Right 11/05/2016   Procedure: RIGHT MASTECTOMY MODIFIED RADICAL;  Surgeon: Fanny Skates, MD;  Location: [place];  Service: General;  Laterality: Right;  . MODIFIED RADICAL  MASTECTOMY Right 11/05/2016  . placement   of port-a-cath    . PORT-A-CATH REMOVAL  2006   insertion 2005  . PORTACATH PLACEMENT Right 11/05/2016   Procedure: INSERTION PORT-A-CATH WITH ULTRA SOUND;  Surgeon: Fanny Skates, MD;  Location: [place];  Service: General;  Laterality: Right;  . removal of port a cath  12/2014  . THYROID SURGERY  2009   Ablation   . TOTAL KNEE ARTHROPLASTY Left 02/06/2016   Procedure: TOTAL KNEE ARTHROPLASTY;  Surgeon: Frederik Pear, MD;  Location: [place];  Service: Orthopedics;  Laterality: Left;  . TUBAL LIGATION      FAMILY HISTORY Family History  Problem Relation Age of Onset  . Heart attack Brother        Massive MI in 66s  . Stroke Brother        CAD  . Kidney disease Mother   . Stroke Mother   . Diabetes Mother   . Liver disease Sister   . COPD Sister        had I-131 rx of hyperthyroidism  . Diabetes Sister   . Stroke Sister   . Cancer Paternal Uncle   . Cancer Cousin   . Heart failure Father   . Heart disease Father   . Arthritis Father   . Sarcoidosis Sister   The patient's father died at the age of 9 in the setting of Alzheimer's disease. The patient's mother died at the age of 38 from complications of diabetes. The patient has 3 brothers, 4 sisters. There is no history of breast or ovarian cancer in the family area   GYNECOLOGIC HISTORY:  Patient's last menstrual period was 11/06/2003.  menarche age 89, first live birth age 21, the patient is [place] P2. She stopped having periods in 2005, with her chemotherapy; she never took hormone replacement   SOCIAL HISTORY:  Morissa worked for the [employer] more than 20 years. 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 At home she lives with her son Forestine Na, her daughter [name] who works for [employer] and her granddaughter Donita Brooks, 44 y/o AS OF APRIL 2018     ADVANCED DIRECTIVES:  not in place    HEALTH MAINTENANCE: Social History  Substance  Use Topics  . Smoking status: Never Smoker  . Smokeless tobacco: Never Used  . Alcohol use No     Colonoscopy:  PAP:  Bone density:   Allergies  Allergen Reactions  . Lisinopril Anaphylaxis and Swelling    Swelling of tongue and mouth 11/05/16- tolerates Olmesartan  . Pepcid [Famotidine] Other (See Comments)    PPI H2, BLOCKERS LOWER GASTRIC PH WHICH WOULD LEAD TO SUBTHERAPEUTIC RILPIVIRINE LEVELS AND POTENTIAL VIROLOGICAL FAILURE WITH RESISTANCE  . Prilosec [Omeprazole] Other (See Comments)    PPI H2, BLOCKERS LOWER GASTRIC PH WHICH WOULD LEAD TO SUBTHERAPEUTIC RILPIVIRINE LEVELS AND POTENTIAL VIROLOGICAL FAILURE WITH RESISTANCE  . Tums [Calcium Carbonate Antacid] Other (See Comments)    TUMS ANTACIDS CAN LOWER GASTRICWHICH COULD  LEAD TO SUBTHERAPEUTIC RILPIVIRINE LEVELS AND POTENTIAL VIROLOGICAL FAILURE WITH RESISTANCE TUMS CAN BE GIVEN BUT NEED CONSULT WITH ID PHARMACY RE TIMING. I PREFER HER TO AVOID ALL TOGETHER    Current Outpatient Prescriptions  Medication Sig Dispense Refill  .  CALCIUM-MAGNESIUM-ZINC PO Take by mouth.    . Cholecalciferol (VITAMIN D3) 2000 units TABS Take 2,000 Units by mouth daily with lunch.    . co-enzyme Q-10 30 MG capsule Take 1 tablet by mouth daily.    . darunavir-cobicistat (PREZCOBIX) 800-150 MG tablet TAKE 1 TABLET BY MOUTH DAILY. SWALLOW WHOLE. DO NOT CRUSH, BREAK OR CHEW TABLETS. TAKE WITH FOOD. (Patient taking differently: Take 1 tablet by mouth every evening. TAKE 1 TABLET BY MOUTH DAILY. SWALLOW WHOLE. DO NOT CRUSH, BREAK OR CHEW TABLETS. TAKE WITH FOOD.) 30 tablet 11  . dexamethasone (DECADRON) 4 MG tablet Take 2 tablets (8 mg total) by mouth daily. Start the day after chemotherapy for 2 days. Take with food. 30 tablet 1  . Dolutegravir-Rilpivirine (JULUCA) 50-25 MG TABS Take 1 tablet by mouth daily with breakfast. Take with the Prezcobix. 30 tablet 9  . gabapentin (NEURONTIN) 300 MG capsule Take 1 capsule (300 mg total) by mouth 4 (four) times  daily. 120 capsule 4  . HYDROcodone-acetaminophen (NORCO/VICODIN) 5-325 MG tablet Take 1 tablet by mouth every 6 (six) hours as needed for moderate pain. 120 tablet 0  . levothyroxine (SYNTHROID, LEVOTHROID) 150 MCG tablet TAKE 1 TABLET EVERY DAY BEFORE BREAKFAST 90 tablet 1  . lidocaine-prilocaine (EMLA) cream Apply to affected area once 30 g 3  . loratadine (CLARITIN) 10 MG tablet Take 10 mg by mouth daily as needed for itching. Reported on 06/08/2015    . LORazepam (ATIVAN) 0.5 MG tablet Take 1 tablet (0.5 mg total) by mouth at bedtime as needed (Nausea or vomiting). 30 tablet 0  . methocarbamol (ROBAXIN) 500 MG tablet Take 500 mg by mouth every 8 (eight) hours as needed for muscle spasms.    . Omega-3 Fatty Acids (FISH OIL PO) Take 1 capsule by mouth daily with lunch.    . Pitavastatin Calcium 4 MG TABS Take 1 tablet (4 mg total) by mouth daily. This may be placebo, study provided. Do not dispense. (Patient taking differently: Take 4 mg by mouth daily with lunch. This may be placebo, study provided. Do not dispense.) 30 tablet 11  . Probiotic Product (PROBIOTIC PO) Take 1 capsule by mouth daily with lunch.    . prochlorperazine (COMPAZINE) 10 MG tablet Take 1 tablet (10 mg total) by mouth every 6 (six) hours as needed (Nausea or vomiting). 30 tablet 1  . triamterene-hydrochlorothiazide (DYAZIDE) 37.5-25 MG capsule Take 1 each (1 capsule total) by mouth daily. 90 capsule 4   No current facility-administered medications for this visit.     OBJECTIVE:  Vitals:   01/21/17 1248  BP: 107/61  Pulse: 80  Resp: 18  Temp: 98.2 F (36.8 C)     Body mass index is 40.35 kg/m.    Filed Weights   01/21/17 1248  Weight: 257 lb 9.6 oz (116.8 kg)   GENERAL: Patient is a well appearing female in no acute distress HEENT:  Sclerae anicteric.  Oropharynx clear and moist. No ulcerations or evidence of oropharyngeal candidiasis. Neck is supple.  NODES:  No cervical, supraclavicular, or axillary  lymphadenopathy palpated.  BREAST EXAM:  Deferred. LUNGS:  Clear to auscultation bilaterally.  No wheezes or rhonchi. HEART:  Regular rate and rhythm. No murmur appreciated. ABDOMEN:  Soft, nontender.  Positive, normoactive bowel sounds. No organomegaly palpated. MSK:  No focal spinal tenderness to palpation. Full range of motion bilaterally in the upper extremities. EXTREMITIES:  No peripheral edema.   SKIN:  Clear with no obvious rashes or skin changes.  No nail dyscrasia. NEURO:  Nonfocal. Well oriented.  Appropriate affect.      ECOG FS:1 - Symptomatic but completely ambulatory   LAB RESULTS:  CMP     Component Value Date/Time   NA 141 01/21/2017 1239   K 3.4 (L) 01/21/2017 1239   CL 103 11/12/2016 0900   CO2 29 01/21/2017 1239   GLUCOSE 92 01/21/2017 1239   BUN 17.4 01/21/2017 1239   CREATININE 1.3 (H) 01/21/2017 1239   CALCIUM 10.1 01/21/2017 1239   PROT 7.0 01/21/2017 1239   ALBUMIN 3.5 01/21/2017 1239   AST 17 01/21/2017 1239   ALT 16 01/21/2017 1239   ALKPHOS 79 01/21/2017 1239   BILITOT 0.43 01/21/2017 1239   GFRNONAA 43 (L) 11/06/2016 0333   GFRNONAA 42 (L) 09/18/2016 0834   GFRAA 50 (L) 11/06/2016 0333   GFRAA 48 (L) 09/18/2016 0834    No results found for: TOTALPROTELP, ALBUMINELP, A1GS, A2GS, BETS, BETA2SER, GAMS, MSPIKE, SPEI  No results found for: Nils Pyle, [facility]  Lab Results  Component Value Date   WBC 3.6 (L) 01/21/2017   NEUTROABS 1.7 01/21/2017   HGB 10.4 (L) 01/21/2017   HCT 31.1 (L) 01/21/2017   MCV 101.0 01/21/2017   PLT 164 01/21/2017      Chemistry      Component Value Date/Time   NA 141 01/21/2017 1239   K 3.4 (L) 01/21/2017 1239   CL 103 11/12/2016 0900   CO2 29 01/21/2017 1239   BUN 17.4 01/21/2017 1239   CREATININE 1.3 (H) 01/21/2017 1239   GLU 104 02/13/2016 1358      Component Value Date/Time   CALCIUM 10.1 01/21/2017 1239   ALKPHOS 79 01/21/2017 1239   AST 17 01/21/2017 1239   ALT 16 01/21/2017  1239   BILITOT 0.43 01/21/2017 1239       Lab Results  Component Value Date   LABCA2 11 04/20/2008    No components found for: [id number]  No results for input(s): INR in the last 168 hours.  Urinalysis    Component Value Date/Time   COLORURINE YELLOW 01/27/2016 1028   APPEARANCEUR CLEAR 01/27/2016 1028   LABSPEC 1.028 01/27/2016 1028   PHURINE 5.5 01/27/2016 1028   GLUCOSEU NEGATIVE 01/27/2016 1028   GLUCOSEU NEG mg/dL 07/23/2006 2113   HGBUR NEGATIVE 01/27/2016 1028   BILIRUBINUR NEGATIVE 01/27/2016 1028   KETONESUR NEGATIVE 01/27/2016 1028   PROTEINUR NEGATIVE 01/27/2016 1028   UROBILINOGEN 1 07/23/2006 2113   NITRITE NEGATIVE 01/27/2016 1028   LEUKOCYTESUR NEGATIVE 01/27/2016 1028     STUDIES: US Transvaginal Non-ob  Result Date: 01/04/2017 CLINICAL DATA:  Thickened endometrium visualized on CT EXAM: ULTRASOUND PELVIS TRANSVAGINAL TECHNIQUE: Transvaginal ultrasound examination of the pelvis was performed including evaluation of the uterus, ovaries, adnexal regions, and pelvic cul-de-sac. COMPARISON:  CT 11/16/2016 FINDINGS: Uterus Measurements: 5.5 x 4.0 x 4.3 cm. No fibroids or other mass visualized. Endometrium Thickness: Thickened measuring up to 19 mm. Complex heterogeneous appearance with complex cystic areas centrally measuring 2.0 x 1.5 cm. Right ovary Measurements: Not visualized. No adnexal mass seen Left ovary Measurements: Not visualized. No adnexal mass seen. Other findings:  No abnormal free fluid IMPRESSION: Thickened, complex appearing endometrium with complex cystic area in the fundus. Endometrial thickness is considered abnormal for an asymptomatic post-menopausal female. Endometrial sampling should be considered to exclude carcinoma. Electronically Signed   By: Rolm Baptise M.D.   On: 01/04/2017 12:13     ELIGIBLE FOR AVAILABLE RESEARCH PROTOCOL: no  ASSESSMENT: 61  y.o. [place] woman   (1) status post right lumpectomy and sentinel lymph node  sampling October 2005 for a 0.6 cm invasive ductal carcinoma involving one out of 2 sentinel lymph nodes sampled, grade 3, triple-negative, treated adjuvantly with doxorubicin and cyclophosphamide 4 followed by weekly paclitaxel 7, followed by adjuvant radiation  RECURRENT DISEASE: (2) status post right breast upper outer quadrant biopsy and right axillary lymph node biopsy 10/01/2016, both positive for a T2 N1, stage IIIB invasive ductal carcinoma, triple negative, with an MIB-1 of 50-70%  (3) status post right modified radical mastectomy 11/05/2016 showing a pT2 pN1, stage IIIB invasive ductal carcinoma, grade 3, triple negative, with negative margins  (4) Not a candidate for radiation given prior history  (5) adjuvant chemotherapy will consist of carboplatin and gemcitabine given days 1 and 8 of each 21 day cycle, for 6 cycles, starting 11/20/2016  (a) day 1 cycle 2 omitted because of neutropenia; Neupogen/Neulasta added  (5) HIV positivity  PLAN: Alithia is doing well.  Her labs are largely normal.  She has mild renal insufficiency that is stable, and mild hypokalemia.  We reviewed in detail a potassium rich diet.  She and I also reviewed her chemo regimen and returning for the neupogen along with the neulasta as ordered.  She is tolerating chemotherapy well.  She will proceed with treatment today.  Tashai will return as scheduled for Neupogen in addition to her day 8 Gemcitabine, and her fifth cycle of Gemcitabine [place] when she will see Dr. Jana Hakim as well.    A total of (30) minutes of face-to-face time was spent with this patient with greater than 50% of that time in counseling and care-coordination.    Scot Dock, NP   01/21/2017 1:59 PM Medical Oncology and Hematology [facility] [address] [place], [place] [address] Tel. [phone]    Fax. [phone]

## 2017-01-21 ENCOUNTER — Inpatient Hospital Stay (HOSPITAL_BASED_OUTPATIENT_CLINIC_OR_DEPARTMENT_OTHER): Payer: Medicare HMO | Admitting: Adult Health

## 2017-01-21 ENCOUNTER — Encounter: Payer: Self-pay | Admitting: Adult Health

## 2017-01-21 ENCOUNTER — Other Ambulatory Visit (HOSPITAL_BASED_OUTPATIENT_CLINIC_OR_DEPARTMENT_OTHER): Payer: Medicare HMO

## 2017-01-21 ENCOUNTER — Ambulatory Visit (HOSPITAL_BASED_OUTPATIENT_CLINIC_OR_DEPARTMENT_OTHER): Payer: Medicare HMO

## 2017-01-21 ENCOUNTER — Other Ambulatory Visit: Payer: Self-pay | Admitting: Hematology and Oncology

## 2017-01-21 VITALS — BP 107/61 | HR 80 | Temp 98.2°F | Resp 18 | Ht 67.0 in | Wt 257.6 lb

## 2017-01-21 DIAGNOSIS — C50411 Malignant neoplasm of upper-outer quadrant of right female breast: Secondary | ICD-10-CM

## 2017-01-21 DIAGNOSIS — E876 Hypokalemia: Secondary | ICD-10-CM

## 2017-01-21 DIAGNOSIS — Z5111 Encounter for antineoplastic chemotherapy: Secondary | ICD-10-CM

## 2017-01-21 DIAGNOSIS — N289 Disorder of kidney and ureter, unspecified: Secondary | ICD-10-CM | POA: Diagnosis not present

## 2017-01-21 DIAGNOSIS — Z171 Estrogen receptor negative status [ER-]: Secondary | ICD-10-CM

## 2017-01-21 DIAGNOSIS — C773 Secondary and unspecified malignant neoplasm of axilla and upper limb lymph nodes: Secondary | ICD-10-CM

## 2017-01-21 DIAGNOSIS — B20 Human immunodeficiency virus [HIV] disease: Secondary | ICD-10-CM | POA: Diagnosis not present

## 2017-01-21 DIAGNOSIS — C50911 Malignant neoplasm of unspecified site of right female breast: Secondary | ICD-10-CM

## 2017-01-21 LAB — CBC WITH DIFFERENTIAL/PLATELET
BASO%: 0.5 % (ref 0.0–2.0)
BASOS ABS: 0 10*3/uL (ref 0.0–0.1)
EOS%: 0.9 % (ref 0.0–7.0)
Eosinophils Absolute: 0 10*3/uL (ref 0.0–0.5)
HCT: 31.1 % — ABNORMAL LOW (ref 34.8–46.6)
HEMOGLOBIN: 10.4 g/dL — AB (ref 11.6–15.9)
LYMPH%: 36.9 % (ref 14.0–49.7)
MCH: 33.6 pg (ref 25.1–34.0)
MCHC: 33.3 g/dL (ref 31.5–36.0)
MCV: 101 fL (ref 79.5–101.0)
MONO#: 0.5 10*3/uL (ref 0.1–0.9)
MONO%: 13.8 % (ref 0.0–14.0)
NEUT%: 47.9 % (ref 38.4–76.8)
NEUTROS ABS: 1.7 10*3/uL (ref 1.5–6.5)
Platelets: 164 10*3/uL (ref 145–400)
RBC: 3.08 10*6/uL — ABNORMAL LOW (ref 3.70–5.45)
RDW: 19.4 % — AB (ref 11.2–14.5)
WBC: 3.6 10*3/uL — AB (ref 3.9–10.3)
lymph#: 1.3 10*3/uL (ref 0.9–3.3)

## 2017-01-21 LAB — COMPREHENSIVE METABOLIC PANEL
ALBUMIN: 3.5 g/dL (ref 3.5–5.0)
ALK PHOS: 79 U/L (ref 40–150)
ALT: 16 U/L (ref 0–55)
AST: 17 U/L (ref 5–34)
Anion Gap: 7 mEq/L (ref 3–11)
BUN: 17.4 mg/dL (ref 7.0–26.0)
CO2: 29 mEq/L (ref 22–29)
Calcium: 10.1 mg/dL (ref 8.4–10.4)
Chloride: 105 mEq/L (ref 98–109)
Creatinine: 1.3 mg/dL — ABNORMAL HIGH (ref 0.6–1.1)
EGFR: 50 mL/min/{1.73_m2} — ABNORMAL LOW (ref >90)
GLUCOSE: 92 mg/dL (ref 70–140)
POTASSIUM: 3.4 meq/L — AB (ref 3.5–5.1)
SODIUM: 141 meq/L (ref 136–145)
Total Bilirubin: 0.43 mg/dL (ref 0.20–1.20)
Total Protein: 7 g/dL (ref 6.4–8.3)

## 2017-01-21 MED ORDER — SODIUM CHLORIDE 0.9 % IV SOLN
221.4000 mg | Freq: Once | INTRAVENOUS | Status: AC
  Administered 2017-01-21: 220 mg via INTRAVENOUS
  Filled 2017-01-21: qty 22

## 2017-01-21 MED ORDER — SODIUM CHLORIDE 0.9% FLUSH
10.0000 mL | INTRAVENOUS | Status: DC | PRN
  Administered 2017-01-21: 10 mL
  Filled 2017-01-21: qty 10

## 2017-01-21 MED ORDER — HEPARIN SOD (PORK) LOCK FLUSH 100 UNIT/ML IV SOLN
500.0000 [IU] | Freq: Once | INTRAVENOUS | Status: AC | PRN
  Administered 2017-01-21: 500 [IU]
  Filled 2017-01-21: qty 5

## 2017-01-21 MED ORDER — DEXAMETHASONE SODIUM PHOSPHATE 10 MG/ML IJ SOLN
INTRAMUSCULAR | Status: AC
  Filled 2017-01-21: qty 1

## 2017-01-21 MED ORDER — GEMCITABINE HCL CHEMO INJECTION 1 GM/26.3ML
2400.0000 mg | Freq: Once | INTRAVENOUS | Status: AC
  Administered 2017-01-21: 2400 mg via INTRAVENOUS
  Filled 2017-01-21: qty 63.12

## 2017-01-21 MED ORDER — PALONOSETRON HCL INJECTION 0.25 MG/5ML
INTRAVENOUS | Status: AC
  Filled 2017-01-21: qty 5

## 2017-01-21 MED ORDER — PALONOSETRON HCL INJECTION 0.25 MG/5ML
0.2500 mg | Freq: Once | INTRAVENOUS | Status: AC
  Administered 2017-01-21: 0.25 mg via INTRAVENOUS

## 2017-01-21 MED ORDER — DEXAMETHASONE SODIUM PHOSPHATE 10 MG/ML IJ SOLN
10.0000 mg | Freq: Once | INTRAMUSCULAR | Status: AC
  Administered 2017-01-21: 10 mg via INTRAVENOUS

## 2017-01-21 MED ORDER — SODIUM CHLORIDE 0.9 % IV SOLN
Freq: Once | INTRAVENOUS | Status: AC
  Administered 2017-01-21: 14:00:00 via INTRAVENOUS

## 2017-01-21 MED FILL — PREZCOBIX 800 MG-150 MG TAB: 800-150 | 30 days supply | Qty: 30 | Fill #5

## 2017-01-21 NOTE — Patient Instructions (Signed)
Kayla Price Discharge Instructions for Patients Receiving Chemotherapy  Today you received the following chemotherapy agents Gemzar/Carboplatin  To help prevent nausea and vomiting after your treatment, we encourage you to take your nausea medication    If you develop nausea and vomiting that is not controlled by your nausea medication, call the clinic.   BELOW ARE SYMPTOMS THAT SHOULD BE REPORTED IMMEDIATELY:  *FEVER GREATER THAN 100.5 F  *CHILLS WITH OR WITHOUT FEVER  NAUSEA AND VOMITING THAT IS NOT CONTROLLED WITH YOUR NAUSEA MEDICATION  *UNUSUAL SHORTNESS OF BREATH  *UNUSUAL BRUISING OR BLEEDING  TENDERNESS IN MOUTH AND THROAT WITH OR WITHOUT PRESENCE OF ULCERS  *URINARY PROBLEMS  *BOWEL PROBLEMS  UNUSUAL RASH Items with * indicate a potential emergency and should be followed up as soon as possible.  Feel free to call the clinic you have any questions or concerns. The clinic phone number is [phone].  Please show the [place] at check-in to the Emergency Department and triage nurse.

## 2017-01-22 ENCOUNTER — Ambulatory Visit: Payer: Medicare HMO | Admitting: Physical Therapy

## 2017-01-22 ENCOUNTER — Ambulatory Visit (HOSPITAL_BASED_OUTPATIENT_CLINIC_OR_DEPARTMENT_OTHER): Payer: Medicare HMO

## 2017-01-22 VITALS — BP 148/80 | HR 83 | Temp 97.9°F | Resp 20

## 2017-01-22 DIAGNOSIS — Z171 Estrogen receptor negative status [ER-]: Secondary | ICD-10-CM

## 2017-01-22 DIAGNOSIS — M6281 Muscle weakness (generalized): Secondary | ICD-10-CM | POA: Diagnosis not present

## 2017-01-22 DIAGNOSIS — R600 Localized edema: Secondary | ICD-10-CM | POA: Diagnosis not present

## 2017-01-22 DIAGNOSIS — C50411 Malignant neoplasm of upper-outer quadrant of right female breast: Secondary | ICD-10-CM | POA: Diagnosis not present

## 2017-01-22 DIAGNOSIS — M79601 Pain in right arm: Secondary | ICD-10-CM | POA: Diagnosis not present

## 2017-01-22 DIAGNOSIS — C773 Secondary and unspecified malignant neoplasm of axilla and upper limb lymph nodes: Secondary | ICD-10-CM | POA: Diagnosis not present

## 2017-01-22 DIAGNOSIS — Z5189 Encounter for other specified aftercare: Secondary | ICD-10-CM | POA: Diagnosis not present

## 2017-01-22 DIAGNOSIS — C50911 Malignant neoplasm of unspecified site of right female breast: Secondary | ICD-10-CM

## 2017-01-22 DIAGNOSIS — M25611 Stiffness of right shoulder, not elsewhere classified: Secondary | ICD-10-CM

## 2017-01-22 MED ORDER — TBO-FILGRASTIM 480 MCG/0.8ML ~~LOC~~ SOSY
480.0000 ug | PREFILLED_SYRINGE | Freq: Once | SUBCUTANEOUS | Status: AC
  Administered 2017-01-22: 480 ug via SUBCUTANEOUS
  Filled 2017-01-22: qty 0.8

## 2017-01-22 MED FILL — JULUCA 50-25 MG TAB: 50-25 | 30 days supply | Qty: 30 | Fill #1

## 2017-01-22 NOTE — Patient Instructions (Signed)
Tbo-Filgrastim injection What is this medicine? TBO-FILGRASTIM (T B O fil GRA stim) is a granulocyte colony-stimulating factor that stimulates the growth of neutrophils, a type of white blood cell important in the body's fight against infection. It is used to reduce the incidence of fever and infection in patients with certain types of cancer who are receiving chemotherapy that affects the bone marrow. This medicine may be used for other purposes; ask your health care provider or pharmacist if you have questions. COMMON BRAND NAME(S): Granix What should I tell my health care provider before I take this medicine? They need to know if you have any of these conditions: -bone scan or tests planned -kidney disease -sickle cell anemia -an unusual or allergic reaction to tbo-filgrastim, filgrastim, pegfilgrastim, other medicines, foods, dyes, or preservatives -pregnant or trying to get pregnant -breast-feeding How should I use this medicine? This medicine is for injection under the skin. If you get this medicine at home, you will be taught how to prepare and give this medicine. Refer to the Instructions for Use that come with your medication packaging. Use exactly as directed. Take your medicine at regular intervals. Do not take your medicine more often than directed. It is important that you put your used needles and syringes in a special sharps container. Do not put them in a trash can. If you do not have a sharps container, call your pharmacist or healthcare provider to get one. Talk to your pediatrician regarding the use of this medicine in children. Special care may be needed. Overdosage: If you think you have taken too much of this medicine contact a poison control center or emergency room at once. NOTE: This medicine is only for you. Do not share this medicine with others. What if I miss a dose? It is important not to miss your dose. Call your doctor or health care professional if you miss a  dose. What may interact with this medicine? This medicine may interact with the following medications: -medicines that may cause a release of neutrophils, such as lithium This list may not describe all possible interactions. Give your health care provider a list of all the medicines, herbs, non-prescription drugs, or dietary supplements you use. Also tell them if you smoke, drink alcohol, or use illegal drugs. Some items may interact with your medicine. What should I watch for while using this medicine? You may need blood work done while you are taking this medicine. What side effects may I notice from receiving this medicine? Side effects that you should report to your doctor or health care professional as soon as possible: -allergic reactions like skin rash, itching or hives, swelling of the face, lips, or tongue -blood in the urine -dark urine -dizziness -fast heartbeat -feeling faint -shortness of breath or breathing problems -signs and symptoms of infection like fever or chills; cough; or sore throat -signs and symptoms of kidney injury like trouble passing urine or change in the amount of urine -stomach or side pain, or pain at the shoulder -sweating -swelling of the legs, ankles, or abdomen -tiredness Side effects that usually do not require medical attention (report to your doctor or health care professional if they continue or are bothersome): -bone pain -headache -muscle pain -vomiting This list may not describe all possible side effects. Call your doctor for medical advice about side effects. You may report side effects to FDA at 1-800-FDA-1088. Where should I keep my medicine? Keep out of the reach of children. Store in a refrigerator between   2 and 8 degrees C (36 and 46 degrees F). Keep in carton to protect from light. Throw away this medicine if it is left out of the refrigerator for more than 5 consecutive days. Throw away any unused medicine after the expiration  date. NOTE: This sheet is a summary. It may not cover all possible information. If you have questions about this medicine, talk to your doctor, pharmacist, or health care provider.  2018 Elsevier/Gold Standard (2015-08-01 19:07:04)  

## 2017-01-22 NOTE — Therapy (Signed)
[place], [place], [address] Phone: [phone]   Fax:  [phone]  Physical Therapy Treatment  Patient Details  Name: Kayla Price MRN: [id number] Date of Birth: 03/06/1956 Referring Provider: Magrinat  Encounter Date: 01/22/2017      PT End of Session - 01/22/17 1439    Visit Number 8   Number of Visits 9   Date for PT Re-Evaluation 01/24/17   PT Start Time 8850   PT Stop Time 1430   PT Time Calculation (min) 42 min   Activity Tolerance Patient tolerated treatment well   Behavior During Therapy [facility] for tasks assessed/performed      Past Medical History:  Diagnosis Date  . Alopecia areata 11/28/2009  . Bell's palsy   . BREAST CANCER, HX OF 2005  . Cancer [facility]) 2005   Breast cancer   chemotherapy and radiation  . CKD (chronic kidney disease) stage 3, GFR 30-59 ml/min 06/08/2015  . Dry eye syndrome   . Fasting hyperglycemia   . HIP FRACTURE, RIGHT 05/06/2008  . HIV (human immunodeficiency virus infection) ([place])   . HIV DISEASE 03/27/2006  . HYPERLIPIDEMIA, MIXED 12/15/2007  . HYPERTENSION 03/27/2006  . 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 MENORRHAGIA, POSTMENOPAUSAL 02/03/2009  . Osteoarthritis of left knee 06/08/2015  . OSTEOARTHROSIS, LOCAL, SCND, UNSPC SITE 04/07/2007  . PVD 04/07/2007  . Tinea capitis   . TRIGGER FINGER 05/06/2008  . Unspecified vitamin D deficiency 08/06/2007    Past Surgical History:  Procedure Laterality Date  . BREAST LUMPECTOMY Right    2005  . COLONOSCOPY    . HYSTEROSCOPY  2006  . KNEE ARTHROSCOPY Left 2002  . LYMPH NODE DISSECTION  2005  . MASTECTOMY MODIFIED RADICAL Right 11/05/2016   Procedure: RIGHT MASTECTOMY MODIFIED RADICAL;  Surgeon: Fanny Skates, MD;  Location: [place];  Service: General;  Laterality: Right;  . MODIFIED RADICAL MASTECTOMY Right 11/05/2016  . placement   of port-a-cath    . PORT-A-CATH REMOVAL  2006   insertion 2005   . PORTACATH PLACEMENT Right 11/05/2016   Procedure: INSERTION PORT-A-CATH WITH ULTRA SOUND;  Surgeon: Fanny Skates, MD;  Location: [place];  Service: General;  Laterality: Right;  . removal of port a cath  12/2014  . THYROID SURGERY  2009   Ablation   . TOTAL KNEE ARTHROPLASTY Left 02/06/2016   Procedure: TOTAL KNEE ARTHROPLASTY;  Surgeon: Frederik Pear, MD;  Location: [place];  Service: Orthopedics;  Laterality: Left;  . TUBAL LIGATION      There were no vitals filed for this visit.      Subjective Assessment - 01/22/17 1349    Subjective "I don't even know if its there any more"  re: cording   Pertinent History R breast cancer in 2005 followed by lumpectomy, SLNB, chemo and radiation, R breast cancer recurrence in 2018 followed by mastectomy 5/14/12018  and ALND,( 10 nodes removed) and one seroma drained.  and chemo, L TKA on 02/06/2016, HIV positive since 1993, arthritis   Patient Stated Goals to get my arm comfortable   Currently in Pain? No/denies               LYMPHEDEMA/ONCOLOGY QUESTIONNAIRE - 01/22/17 1353      Right Upper Extremity Lymphedema   15 cm Proximal to Olecranon Process 39 cm   Olecranon Process 31 cm   15 cm Proximal to Ulnar Styloid Process 28 cm   Just Proximal to Ulnar Styloid Process 17 cm  Across Hand at [employer] 20.5 cm   At [place] of 2nd Digit 6.8 cm                  [facility] Adult PT Treatment/Exercise - 01/22/17 0001      Shoulder Exercises: Sidelying   ABduction AROM;Right;10 reps   Other Sidelying Exercises small circles pointed to the ceilings    Other Sidelying Exercises from left sidelying, horizontal abduction and thoracic rotation for stretch in anterior chest      Shoulder Exercises: Standing   Other Standing Exercises free motion machine with no weight, diagonals for extension in both directions, and bilateral standing rows with elbows straight [facility]  Pt able to verbalize  exrcise progression and was able to return demonstrate exercise tecnique      Manual Therapy   Manual therapy comments cording even more improved today at forearm stil mildly present  with palapation.  also present in anterior chest. Remeasured arms today.     Myofascial Release myofascial stretch to right foream and axilla and full UE pulling                 PT Education - 01/22/17 1438    Education provided Yes   Education Details exercises to do for UE with cable machine at the [facility]) Educated Patient   Methods Explanation;Demonstration   Comprehension Verbalized understanding;Returned demonstration                [facility] Goals - 01/09/17 1355      CC Long Term Goal  #4   Title Pt will obtain a compression sleeve to wear when exercising to help decrease risk of developing lymphedema.   Status Achieved            Plan - 01/22/17 1439    Clinical Impression Statement Pt continues to improve and cording is now only noticeable with deep palpation.  Upgraded exercise for her to do at home and at the [facility].  She will be ready to discharge next session    Rehab Potential Good   Clinical Impairments Affecting Rehab Potential pt undergoing chemo, past hx of radiation    PT Treatment/Interventions ADLs/Self Care Home Management;Therapeutic exercise;Therapeutic activities;Patient/family education;Manual techniques;Manual lymph drainage;Scar mobilization;Passive range of motion;Taping   PT Next Visit Plan Work on tightness at right shoulder/chest  Review supine scapular series and progression and answer questions.  Progress strenthening for pt to do at the [facility]. Prepare for discharge next week    Consulted and Agree with Plan of Care Patient      Patient will benefit from skilled therapeutic intervention in order to improve the following deficits and impairments:  Increased fascial restricitons, Decreased knowledge of precautions, Increased edema,  Decreased strength, Impaired UE functional use, Pain, Decreased scar mobility  Visit Diagnosis: Pain in right arm  Stiffness of right shoulder, not elsewhere classified  Localized edema  Muscle weakness (generalized)     Problem List Patient Active Problem List   Diagnosis Date Noted  . Recurrent cancer of right breast ([place]) 11/05/2016  . Malignant neoplasm of upper-outer quadrant of right breast in female, estrogen receptor negative ([place]) 10/09/2016  . Primary localized osteoarthritis of left knee 02/06/2016  . Primary osteoarthritis of left knee 02/05/2016  . Osteoarthritis of left knee 06/08/2015  . CKD (chronic kidney disease) stage 3, GFR 30-59 ml/min 06/08/2015  . Vitamin D deficiency 09/23/2014  . Acute  renal insufficiency 04/23/2014  . 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 Bell's palsy 12/04/2012  . Pharyngitis 01/14/2012  . Contact dermatitis 08/29/2011  . Dry eyes 08/20/2011  . Dry mouth 08/20/2011  . Neuropathy 04/09/2011  . Insomnia 04/09/2011  . Obesities, morbid ([place]) 09/27/2010  . Endometrial polyp 12/22/2009  . ALOPECIA AREATA 11/28/2009  . ACUTE NASOPHARYNGITIS 07/18/2009  . MENORRHAGIA, POSTMENOPAUSAL 02/03/2009  . ABNORMAL GLANDULAR PAPANICOLAOU SMEAR OF CERVIX 11/04/2008  . BROKEN TOOTH, WITH COMPLICATION 50/38/8828  . ALOPECIA 05/06/2008  . TRIGGER FINGER 05/06/2008  . HIP FRACTURE, RIGHT 05/06/2008  . ARTHROSCOPY, LEFT KNEE, HX OF 02/03/2008  . HYPERLIPIDEMIA, MIXED 12/15/2007  . HAND PAIN, RIGHT 12/15/2007  . Other abnormal glucose 12/15/2007  . UNSPECIFIED VITAMIN D DEFICIENCY 08/06/2007  . TINEA CAPITIS 08/04/2007  . CNTC DERMATITIS&OTH ECZEMA DUE OTH CHEM PRODUCTS 08/04/2007  . PVD 04/07/2007  . Secondary localized osteoarthrosis 04/07/2007  . Human immunodeficiency virus (HIV) disease ([place]) 03/27/2006  . Essential hypertension 03/27/2006  . BREAST CANCER, HX OF 03/27/2006   Donato Heinz. Owens Shark PT  Norwood Levo 01/22/2017,  2:41 PM  [place], [place], [address] Phone: [phone]   Fax:  [phone]  Name: LANDIS CASSARO MRN: [id number] Date of Birth: June 23, 1956

## 2017-01-23 ENCOUNTER — Ambulatory Visit: Payer: Medicare HMO

## 2017-01-23 ENCOUNTER — Ambulatory Visit (INDEPENDENT_AMBULATORY_CARE_PROVIDER_SITE_OTHER): Payer: Medicare HMO | Admitting: Pharmacist

## 2017-01-23 VITALS — BP 153/85 | HR 76 | Temp 97.9°F | Resp 18

## 2017-01-23 DIAGNOSIS — Z171 Estrogen receptor negative status [ER-]: Secondary | ICD-10-CM

## 2017-01-23 DIAGNOSIS — C50411 Malignant neoplasm of upper-outer quadrant of right female breast: Secondary | ICD-10-CM

## 2017-01-23 DIAGNOSIS — B20 Human immunodeficiency virus [HIV] disease: Secondary | ICD-10-CM

## 2017-01-23 DIAGNOSIS — C50911 Malignant neoplasm of unspecified site of right female breast: Secondary | ICD-10-CM

## 2017-01-23 LAB — CBC WITH DIFFERENTIAL/PLATELET
BASOS ABS: 0 {cells}/uL (ref 0–200)
Basophils Relative: 0 %
EOS PCT: 0 %
Eosinophils Absolute: 0 cells/uL — ABNORMAL LOW (ref 15–500)
HCT: 30.4 % — ABNORMAL LOW (ref 35.0–45.0)
HEMOGLOBIN: 10.1 g/dL — AB (ref 11.7–15.5)
LYMPHS ABS: 459 {cells}/uL — AB (ref 850–3900)
LYMPHS PCT: 3 %
MCH: 33.7 pg — AB (ref 27.0–33.0)
MCHC: 33.2 g/dL (ref 32.0–36.0)
MCV: 101.3 fL — ABNORMAL HIGH (ref 80.0–100.0)
MPV: 9.4 fL (ref 7.5–12.5)
Monocytes Absolute: 612 cells/uL (ref 200–950)
Monocytes Relative: 4 %
NEUTROS PCT: 93 %
Neutro Abs: 14229 cells/uL — ABNORMAL HIGH (ref 1500–7800)
Platelets: 241 10*3/uL (ref 140–400)
RBC: 3 MIL/uL — AB (ref 3.80–5.10)
RDW: 18.5 % — AB (ref 11.0–15.0)
WBC: 15.3 10*3/uL — AB (ref 3.8–10.8)

## 2017-01-23 MED ORDER — TBO-FILGRASTIM 480 MCG/0.8ML ~~LOC~~ SOSY
480.0000 ug | PREFILLED_SYRINGE | Freq: Once | SUBCUTANEOUS | Status: DC
  Filled 2017-01-23: qty 0.8

## 2017-01-23 MED ORDER — DOLUTEGRAVIR-RILPIVIRINE 50-25 MG PO TABS: 1.0000 | ORAL_TABLET | Freq: Every day | ORAL | 9 refills | Status: DC

## 2017-01-23 NOTE — Progress Notes (Signed)
Thanks so much for taking care of [facility]

## 2017-01-23 NOTE — Patient Instructions (Signed)
Tbo-Filgrastim injection What is this medicine? TBO-FILGRASTIM (T B O fil GRA stim) is a granulocyte colony-stimulating factor that stimulates the growth of neutrophils, a type of white blood cell important in the body's fight against infection. It is used to reduce the incidence of fever and infection in patients with certain types of cancer who are receiving chemotherapy that affects the bone marrow. This medicine may be used for other purposes; ask your health care provider or pharmacist if you have questions. COMMON BRAND NAME(S): Granix What should I tell my health care provider before I take this medicine? They need to know if you have any of these conditions: -bone scan or tests planned -kidney disease -sickle cell anemia -an unusual or allergic reaction to tbo-filgrastim, filgrastim, pegfilgrastim, other medicines, foods, dyes, or preservatives -pregnant or trying to get pregnant -breast-feeding How should I use this medicine? This medicine is for injection under the skin. If you get this medicine at home, you will be taught how to prepare and give this medicine. Refer to the Instructions for Use that come with your medication packaging. Use exactly as directed. Take your medicine at regular intervals. Do not take your medicine more often than directed. It is important that you put your used needles and syringes in a special sharps container. Do not put them in a trash can. If you do not have a sharps container, call your pharmacist or healthcare provider to get one. Talk to your pediatrician regarding the use of this medicine in children. Special care may be needed. Overdosage: If you think you have taken too much of this medicine contact a poison control center or emergency room at once. NOTE: This medicine is only for you. Do not share this medicine with others. What if I miss a dose? It is important not to miss your dose. Call your doctor or health care professional if you miss a  dose. What may interact with this medicine? This medicine may interact with the following medications: -medicines that may cause a release of neutrophils, such as lithium This list may not describe all possible interactions. Give your health care provider a list of all the medicines, herbs, non-prescription drugs, or dietary supplements you use. Also tell them if you smoke, drink alcohol, or use illegal drugs. Some items may interact with your medicine. What should I watch for while using this medicine? You may need blood work done while you are taking this medicine. What side effects may I notice from receiving this medicine? Side effects that you should report to your doctor or health care professional as soon as possible: -allergic reactions like skin rash, itching or hives, swelling of the face, lips, or tongue -blood in the urine -dark urine -dizziness -fast heartbeat -feeling faint -shortness of breath or breathing problems -signs and symptoms of infection like fever or chills; cough; or sore throat -signs and symptoms of kidney injury like trouble passing urine or change in the amount of urine -stomach or side pain, or pain at the shoulder -sweating -swelling of the legs, ankles, or abdomen -tiredness Side effects that usually do not require medical attention (report to your doctor or health care professional if they continue or are bothersome): -bone pain -headache -muscle pain -vomiting This list may not describe all possible side effects. Call your doctor for medical advice about side effects. You may report side effects to FDA at 1-800-FDA-1088. Where should I keep my medicine? Keep out of the reach of children. Store in a refrigerator between   2 and 8 degrees C (36 and 46 degrees F). Keep in carton to protect from light. Throw away this medicine if it is left out of the refrigerator for more than 5 consecutive days. Throw away any unused medicine after the expiration  date. NOTE: This sheet is a summary. It may not cover all possible information. If you have questions about this medicine, talk to your doctor, pharmacist, or health care provider.  2018 Elsevier/Gold Standard (2015-08-01 19:07:04)  

## 2017-01-23 NOTE — Progress Notes (Signed)
HPI: Kayla Price is a 61 y.o. female who presents to the [facility] for HIV follow-up.  Allergies: Allergies  Allergen Reactions  . Lisinopril Anaphylaxis and Swelling    Swelling of tongue and mouth 11/05/16- tolerates Olmesartan  . Pepcid [Famotidine] Other (See Comments)    PPI H2, BLOCKERS LOWER GASTRIC PH WHICH WOULD LEAD TO SUBTHERAPEUTIC RILPIVIRINE LEVELS AND POTENTIAL VIROLOGICAL FAILURE WITH RESISTANCE  . Prilosec [Omeprazole] Other (See Comments)    PPI H2, BLOCKERS LOWER GASTRIC PH WHICH WOULD LEAD TO SUBTHERAPEUTIC RILPIVIRINE LEVELS AND POTENTIAL VIROLOGICAL FAILURE WITH RESISTANCE  . Tums [Calcium Carbonate Antacid] Other (See Comments)    TUMS ANTACIDS CAN LOWER GASTRICWHICH COULD  LEAD TO SUBTHERAPEUTIC RILPIVIRINE LEVELS AND POTENTIAL VIROLOGICAL FAILURE WITH RESISTANCE TUMS CAN BE GIVEN BUT NEED CONSULT WITH [facility] PHARM[facility] RE TIMING. I PREFER HER TO AVOID ALL TOGETHER    Past Medical History: Past Medical History:  Diagnosis Date  . Alopecia areata 11/28/2009  . Bell's palsy   . BREAST CANCER, HX OF 2005  . Cancer [facility]) 2005   Breast cancer   chemotherapy and radiation  . CKD (chronic kidney disease) stage 3, GFR 30-59 ml/min 06/08/2015  . Dry eye syndrome   . Fasting hyperglycemia   . HIP FRACTURE, RIGHT 05/06/2008  . HIV (human immunodeficiency virus infection) ([place])   . HIV DISEASE 03/27/2006  . HYPERLIPIDEMIA, MIXED 12/15/2007  . HYPERTENSION 03/27/2006  . 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 MENORRHAGIA, POSTMENOPAUSAL 02/03/2009  . Osteoarthritis of left knee 06/08/2015  . OSTEOARTHROSIS, LOCAL, SCND, UNSPC SITE 04/07/2007  . PVD 04/07/2007  . Tinea capitis   . TRIGGER FINGER 05/06/2008  . Unspecified vitamin D deficiency 08/06/2007    Social History: Social History   Social History  . Marital status: Widowed    Spouse name: N/A  . Number of children: N/A  . Years of education: N/A   Occupational History  . Postal Worker  Unemployed   Social History Main Topics  . Smoking status: Never Smoker  . Smokeless tobacco: Never Used  . Alcohol use No  . Drug use: No  . Sexual activity: Not Currently    Birth control/ protection: Post-menopausal     Comment: declined condoms   Other Topics Concern  . Not on file   Social History Narrative   Single/widow   Diet: good   Do you drink/eat things with caffeine? Tea occasionally   Marital status: Widowed  What year were you married? 1993   Do you live in a house, apartment,assisted living, condo,trailer,ect.)? House   Is it one or more stories? Two   How many persons live in your home? 4   Do you have any pets in you home? No   Current or past profession: Tour manager   Do you exercise? Yes  Type&how often: Stationary Bike, Water Exercise 3-4 x week   Do you have a living will? Yes   Do you have a DNR form? No  If not do you what one?   Do you have signed POA/HPOA forms? No   If so, please bring to your appointment.                   Current Regimen: Prezcobix + Juluca  Labs: HIV 1 RNA Quant (copies/mL)  Date Value  11/15/2016 <20 NOT DETECTED  05/28/2016 <20  05/14/2016 128 (H)   CD4 (no units)  Date Value  01/07/2017 See Separate Report  12/31/2016 See Results in EPIC  CD4 T Cell Abs (/uL)  Date Value  01/07/2017 1,520  12/31/2016 730  11/15/2016 760   Hep B S Ab (no units)  Date Value  08/19/2006 No   Hepatitis B Surface Ag (no units)  Date Value  08/19/2006 No   HCV Ab (no units)  Date Value  04/08/2014 NEGATIVE    CrCl: Estimated Creatinine Clearance: 60 mL/min (A) (by C-G formula based on SCr of 1.3 mg/dL (H)).  Lipids:    Component Value Date/Time   CHOL 145 11/15/2016 1104   CHOL 191 10/15/2014 1007   TRIG 77 11/15/2016 1104   HDL 54 11/15/2016 1104   HDL 65 10/15/2014 1007   CHOLHDL 2.7 11/15/2016 1104   VLDL 15 11/15/2016 1104   LDLCALC 76 11/15/2016 1104   LDLCALC 99 10/15/2014 1007     Assessment: Kayla Price is here today to follow-up for her HIV infection.  She was recently seen by Dr. Tommy Medal and switched from [facility] for less pill burden.  She is currently taking the Prezcobix + Juluca every night before she goes to bed after eating dinner.  She isn't having any issues tolerating the medications and feels no different since switching. She is still getting chemotherapy and doing well otherwise. I gave her a pill box keychain to carry around with her since is in on the go often during the day. She will get labs today and see Dr. Tommy Medal in September.  She gets her HIV medications mailed from [facility] and is having no issues obtaining them.  Plans: - Continue Juluca + Prezcobix - HIV RNA and CD4 today - F/u with Dr. Tommy Medal 9/12 at 11:15am  Cassie L. Kuppelweiser, PharmD, [place] [facility] 01/23/2017, 2:02 PM

## 2017-01-24 ENCOUNTER — Ambulatory Visit (HOSPITAL_BASED_OUTPATIENT_CLINIC_OR_DEPARTMENT_OTHER): Payer: Medicare HMO

## 2017-01-24 ENCOUNTER — Ambulatory Visit: Payer: Medicare HMO | Attending: Oncology | Admitting: Physical Therapy

## 2017-01-24 VITALS — BP 142/89 | HR 83 | Temp 97.8°F | Resp 20

## 2017-01-24 DIAGNOSIS — Z171 Estrogen receptor negative status [ER-]: Secondary | ICD-10-CM

## 2017-01-24 DIAGNOSIS — Z5189 Encounter for other specified aftercare: Secondary | ICD-10-CM

## 2017-01-24 DIAGNOSIS — M6281 Muscle weakness (generalized): Secondary | ICD-10-CM | POA: Diagnosis not present

## 2017-01-24 DIAGNOSIS — M25611 Stiffness of right shoulder, not elsewhere classified: Secondary | ICD-10-CM | POA: Diagnosis not present

## 2017-01-24 DIAGNOSIS — C50411 Malignant neoplasm of upper-outer quadrant of right female breast: Secondary | ICD-10-CM

## 2017-01-24 DIAGNOSIS — M79601 Pain in right arm: Secondary | ICD-10-CM | POA: Diagnosis not present

## 2017-01-24 DIAGNOSIS — R600 Localized edema: Secondary | ICD-10-CM | POA: Diagnosis not present

## 2017-01-24 DIAGNOSIS — C773 Secondary and unspecified malignant neoplasm of axilla and upper limb lymph nodes: Secondary | ICD-10-CM | POA: Diagnosis not present

## 2017-01-24 DIAGNOSIS — C50911 Malignant neoplasm of unspecified site of right female breast: Secondary | ICD-10-CM

## 2017-01-24 LAB — T-HELPER CELL (CD4) - (RCID CLINIC ONLY)
CD4 % Helper T Cell: 14 % — ABNORMAL LOW (ref 33–55)
CD4 T CELL ABS: 70 /uL — AB (ref 400–2700)

## 2017-01-24 MED ORDER — TBO-FILGRASTIM 480 MCG/0.8ML ~~LOC~~ SOSY
480.0000 ug | PREFILLED_SYRINGE | Freq: Once | SUBCUTANEOUS | Status: DC
Start: 2017-01-24 — End: 2017-01-24
  Administered 2017-01-24: 480 ug via SUBCUTANEOUS

## 2017-01-24 NOTE — Patient Instructions (Signed)
Tbo-Filgrastim injection What is this medicine? TBO-FILGRASTIM (T B O fil GRA stim) is a granulocyte colony-stimulating factor that stimulates the growth of neutrophils, a type of white blood cell important in the body's fight against infection. It is used to reduce the incidence of fever and infection in patients with certain types of cancer who are receiving chemotherapy that affects the bone marrow. This medicine may be used for other purposes; ask your health care provider or pharmacist if you have questions. COMMON BRAND NAME(S): Granix What should I tell my health care provider before I take this medicine? They need to know if you have any of these conditions: -bone scan or tests planned -kidney disease -sickle cell anemia -an unusual or allergic reaction to tbo-filgrastim, filgrastim, pegfilgrastim, other medicines, foods, dyes, or preservatives -pregnant or trying to get pregnant -breast-feeding How should I use this medicine? This medicine is for injection under the skin. If you get this medicine at home, you will be taught how to prepare and give this medicine. Refer to the Instructions for Use that come with your medication packaging. Use exactly as directed. Take your medicine at regular intervals. Do not take your medicine more often than directed. It is important that you put your used needles and syringes in a special sharps container. Do not put them in a trash can. If you do not have a sharps container, call your pharmacist or healthcare provider to get one. Talk to your pediatrician regarding the use of this medicine in children. Special care may be needed. Overdosage: If you think you have taken too much of this medicine contact a poison control center or emergency room at once. NOTE: This medicine is only for you. Do not share this medicine with others. What if I miss a dose? It is important not to miss your dose. Call your doctor or health care professional if you miss a  dose. What may interact with this medicine? This medicine may interact with the following medications: -medicines that may cause a release of neutrophils, such as lithium This list may not describe all possible interactions. Give your health care provider a list of all the medicines, herbs, non-prescription drugs, or dietary supplements you use. Also tell them if you smoke, drink alcohol, or use illegal drugs. Some items may interact with your medicine. What should I watch for while using this medicine? You may need blood work done while you are taking this medicine. What side effects may I notice from receiving this medicine? Side effects that you should report to your doctor or health care professional as soon as possible: -allergic reactions like skin rash, itching or hives, swelling of the face, lips, or tongue -blood in the urine -dark urine -dizziness -fast heartbeat -feeling faint -shortness of breath or breathing problems -signs and symptoms of infection like fever or chills; cough; or sore throat -signs and symptoms of kidney injury like trouble passing urine or change in the amount of urine -stomach or side pain, or pain at the shoulder -sweating -swelling of the legs, ankles, or abdomen -tiredness Side effects that usually do not require medical attention (report to your doctor or health care professional if they continue or are bothersome): -bone pain -headache -muscle pain -vomiting This list may not describe all possible side effects. Call your doctor for medical advice about side effects. You may report side effects to FDA at 1-800-FDA-1088. Where should I keep my medicine? Keep out of the reach of children. Store in a refrigerator between   2 and 8 degrees C (36 and 46 degrees F). Keep in carton to protect from light. Throw away this medicine if it is left out of the refrigerator for more than 5 consecutive days. Throw away any unused medicine after the expiration  date. NOTE: This sheet is a summary. It may not cover all possible information. If you have questions about this medicine, talk to your doctor, pharmacist, or health care provider.  2018 Elsevier/Gold Standard (2015-08-01 19:07:04)  

## 2017-01-24 NOTE — Therapy (Signed)
[place], [place], [address] Phone: [phone]   Fax:  [phone]  Physical Therapy Treatment  Patient Details  Name: Kayla Price MRN: [id number] Date of Birth: Jan 30, 1956 Referring Provider: Magrinat  Encounter Date: 01/24/2017      PT End of Session - 01/24/17 2725    Visit Number 9   Number of Visits 9   Date for PT Re-Evaluation 01/24/17   PT Start Time 3664   PT Stop Time 1429   PT Time Calculation (min) 41 min   Activity Tolerance Patient tolerated treatment well   Behavior During Therapy [facility] for tasks assessed/performed      Past Medical History:  Diagnosis Date  . Alopecia areata 11/28/2009  . Bell's palsy   . BREAST CANCER, HX OF 2005  . Cancer [facility]) 2005   Breast cancer   chemotherapy and radiation  . CKD (chronic kidney disease) stage 3, GFR 30-59 ml/min 06/08/2015  . Dry eye syndrome   . Fasting hyperglycemia   . HIP FRACTURE, RIGHT 05/06/2008  . HIV (human immunodeficiency virus infection) ([place])   . HIV DISEASE 03/27/2006  . HYPERLIPIDEMIA, MIXED 12/15/2007  . HYPERTENSION 03/27/2006  . 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 MENORRHAGIA, POSTMENOPAUSAL 02/03/2009  . Osteoarthritis of left knee 06/08/2015  . OSTEOARTHROSIS, LOCAL, SCND, UNSPC SITE 04/07/2007  . PVD 04/07/2007  . Tinea capitis   . TRIGGER FINGER 05/06/2008  . Unspecified vitamin D deficiency 08/06/2007    Past Surgical History:  Procedure Laterality Date  . BREAST LUMPECTOMY Right    2005  . COLONOSCOPY    . HYSTEROSCOPY  2006  . KNEE ARTHROSCOPY Left 2002  . LYMPH NODE DISSECTION  2005  . MASTECTOMY MODIFIED RADICAL Right 11/05/2016   Procedure: RIGHT MASTECTOMY MODIFIED RADICAL;  Surgeon: Fanny Skates, MD;  Location: [place];  Service: General;  Laterality: Right;  . MODIFIED RADICAL MASTECTOMY Right 11/05/2016  . placement   of port-a-cath    . PORT-A-CATH REMOVAL  2006   insertion 2005  .  PORTACATH PLACEMENT Right 11/05/2016   Procedure: INSERTION PORT-A-CATH WITH ULTRA SOUND;  Surgeon: Fanny Skates, MD;  Location: [place];  Service: General;  Laterality: Right;  . removal of port a cath  12/2014  . THYROID SURGERY  2009   Ablation   . TOTAL KNEE ARTHROPLASTY Left 02/06/2016   Procedure: TOTAL KNEE ARTHROPLASTY;  Surgeon: Frederik Pear, MD;  Location: [place];  Service: Orthopedics;  Laterality: Left;  . TUBAL LIGATION      There were no vitals filed for this visit.      Subjective Assessment - 01/24/17 1355    Subjective Pt feels that she is ready to discharge from PT. She only feels she takes her arm in extreme motions.  She is continuing to exercise at the [facility] 5x a week and sometimes up to 9 times a week if granddaughter is at camp.  Pt exercises her arm while she is on the bike    Pertinent History R breast cancer in 2005 followed by lumpectomy, SLNB, chemo and radiation, R breast cancer recurrence in 2018 followed by mastectomy 5/14/12018  and ALND,( 10 nodes removed) and one seroma drained.  and chemo, L TKA on 02/06/2016, HIV positive since 1993, arthritis   Patient Stated Goals to get my arm comfortable   Currently in Pain? No/denies                  Katina Dung - 01/24/17  0001    Open a tight or new jar No difficulty   Do heavy household chores (wash walls, wash floors) No difficulty   Carry a shopping bag or briefcase No difficulty   Wash your back Mild difficulty   Use a knife to cut food No difficulty   Recreational activities in which you take some force or impact through your arm, shoulder, or hand (golf, hammering, tennis) No difficulty   During the past week, to what extent has your arm, shoulder or hand problem interfered with your normal social activities with family, friends, neighbors, or groups? Not at all   During the past week, to what extent has your arm, shoulder or hand problem limited your work or other regular daily activities Not at all    Arm, shoulder, or hand pain. None   Tingling (pins and needles) in your arm, shoulder, or hand None   Difficulty Sleeping No difficulty   DASH Score 2.27 %               [facility] Adult PT Treatment/Exercise - 01/24/17 0001      Manual Therapy   Manual therapy comments cording minimally perceived    Manual Lymphatic Drainage (MLD) in supine, right shoulder collectors,( avoiding port) superficial and deep abdominal nodes,right inguinal nodes. right axillo-inguino anasamosis, right shoulder, upper arm, elbow and forearm with myofascial stretch to cording on forearm, return along pathways.  then to sidelying for posterior interaxillary anastamosis and back, Pt raised arm overhead for stretch to side and deep breaths to stretch sidebody                         Long Term Clinic Goals - 01/24/17 1357      CC Long Term Goal  #1   Title Pt will report at least a 75% improvement in cording in RUE to allow for improved comfort.   Status Achieved     CC Long Term Goal  #2   Title Pt will be independent in a home exercise program for continued strengthening and stretching of R shoulder   Status Achieved     CC Long Term Goal  #3   Title Pt will be able to independently verbalize lymphedema risk reduction practices   Status Achieved     CC Long Term Goal  #4   Title Pt will obtain a compression sleeve to wear when exercising to help decrease risk of developing lymphedema.   Status Achieved            Plan - 01/24/17 1748    Clinical Impression Statement Pt has done well and all goals have been achieved.  She continues to have mild cording, but anticipate that will continue to improve.  Pt will continue to wear her sleeve and exercise at the [facility]  She is knowledgeable of lymphedema risk reduction    Rehab Potential Good   Clinical Impairments Affecting Rehab Potential pt undergoing chemo, past hx of radiation    PT Next Visit Plan dishcarge this episode    Consulted  and Agree with Plan of Care Patient      Patient will benefit from skilled therapeutic intervention in order to improve the following deficits and impairments:  Increased fascial restricitons, Decreased knowledge of precautions, Increased edema, Decreased strength, Impaired UE functional use, Pain, Decreased scar mobility  Visit Diagnosis: Pain in right arm  Stiffness of right shoulder, not elsewhere classified  Localized edema  Muscle weakness (generalized)  G-Codes - 01/24/17 1751    Functional Assessment Tool Used (Outpatient Only) Quick Dash   Functional Limitation Carrying, moving and handling objects   Carrying, Moving and Handling Objects Goal Status [phone]) 0 percent impaired, limited or restricted   Carrying, Moving and Handling Objects Discharge Status [phone]) At least 1 percent but less than 20 percent impaired, limited or restricted      Problem List Patient Active Problem List   Diagnosis Date Noted  . Recurrent cancer of right breast ([place]) 11/05/2016  . Malignant neoplasm of upper-outer quadrant of right breast in female, estrogen receptor negative ([place]) 10/09/2016  . Primary localized osteoarthritis of left knee 02/06/2016  . Primary osteoarthritis of left knee 02/05/2016  . Osteoarthritis of left knee 06/08/2015  . CKD (chronic kidney disease) stage 3, GFR 30-59 ml/min 06/08/2015  . Vitamin D deficiency 09/23/2014  . Acute renal insufficiency 04/23/2014  . 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 Bell's palsy 12/04/2012  . Pharyngitis 01/14/2012  . Contact dermatitis 08/29/2011  . Dry eyes 08/20/2011  . Dry mouth 08/20/2011  . Neuropathy 04/09/2011  . Insomnia 04/09/2011  . Obesities, morbid ([place]) 09/27/2010  . Endometrial polyp 12/22/2009  . ALOPECIA AREATA 11/28/2009  . ACUTE NASOPHARYNGITIS 07/18/2009  . MENORRHAGIA, POSTMENOPAUSAL 02/03/2009  . ABNORMAL GLANDULAR PAPANICOLAOU SMEAR OF CERVIX 11/04/2008  . BROKEN TOOTH, WITH COMPLICATION  20/60/1561  . ALOPECIA 05/06/2008  . TRIGGER FINGER 05/06/2008  . HIP FRACTURE, RIGHT 05/06/2008  . ARTHROSCOPY, LEFT KNEE, HX OF 02/03/2008  . HYPERLIPIDEMIA, MIXED 12/15/2007  . HAND PAIN, RIGHT 12/15/2007  . Other abnormal glucose 12/15/2007  . UNSPECIFIED VITAMIN D DEFICIENCY 08/06/2007  . TINEA CAPITIS 08/04/2007  . CNTC DERMATITIS&OTH ECZEMA DUE OTH CHEM PRODUCTS 08/04/2007  . PVD 04/07/2007  . Secondary localized osteoarthrosis 04/07/2007  . Human immunodeficiency virus (HIV) disease ([place]) 03/27/2006  . Essential hypertension 03/27/2006  . BREAST CANCER, HX OF 03/27/2006       PHYSICAL THERAPY DISCHARGE SUMMARY  Visits from Start of Care: 9  Current functional level related to goals / functional outcomes: Achieved    Remaining deficits: Mild cording   Education / Equipment: Lymphedema risk reduction, home exercise   Plan: Patient agrees to discharge.  Patient goals were met. Patient is being discharged due to being pleased with the current functional level.  ?????    Donato Heinz. Owens Shark PT  Norwood Levo 01/24/2017, 5:52 PM  [place], [place], [address] Phone: [phone]   Fax:  [phone]  Name: Kayla Price MRN: [id number] Date of Birth: May 18, 1956

## 2017-01-25 ENCOUNTER — Encounter: Payer: Self-pay | Admitting: Family Medicine

## 2017-01-25 ENCOUNTER — Ambulatory Visit (INDEPENDENT_AMBULATORY_CARE_PROVIDER_SITE_OTHER): Payer: Medicare HMO | Admitting: Family Medicine

## 2017-01-25 DIAGNOSIS — R938 Abnormal findings on diagnostic imaging of other specified body structures: Secondary | ICD-10-CM

## 2017-01-25 DIAGNOSIS — R9389 Abnormal findings on diagnostic imaging of other specified body structures: Secondary | ICD-10-CM | POA: Insufficient documentation

## 2017-01-25 NOTE — Assessment & Plan Note (Signed)
Scant tissue gotten today-has had hysteroscopy and D & C x 2 and endometrial sampling x 3 now without evidence of abnormal process. Further w/u is probably not necessary without bleeding.

## 2017-01-25 NOTE — Progress Notes (Signed)
   Subjective:    Patient ID: Kayla Price is a 61 y.o. female presenting with Follow-up  on 01/25/2017  HPI: Here for f/u. Has had a thickened endometrium and multiple previous w/u with biopsy, D & C x 2. She is undergoing treatment for recurrent breast cancer and was asked to see [place] for possible sampling. Had normal pap last week. TVUS revealed continued thickened lining at 19 mm. She has not had any bleeding since 2011.  Review of Systems  Constitutional: Negative for chills and fever.  Respiratory: Negative for shortness of breath.   Cardiovascular: Negative for chest pain.  Gastrointestinal: Negative for abdominal pain, nausea and vomiting.  Genitourinary: Negative for dysuria.  Skin: Negative for rash.      Objective:    BP 106/70   Pulse 81   Wt 254 lb (115.2 kg)   LMP 11/06/2003   BMI 39.78 kg/m  Physical Exam  Constitutional: She is oriented to person, place, and time. She appears well-developed and well-nourished. No distress.  HENT:  Head: Normocephalic and atraumatic.  Eyes: No scleral icterus.  Neck: Neck supple.  Cardiovascular: Normal rate.   Pulmonary/Chest: Effort normal.  Abdominal: Soft.  Neurological: She is alert and oriented to person, place, and time.  Skin: Skin is warm and dry.  Psychiatric: She has a normal mood and affect.   Procedure: Patient given informed consent, signed copy in the chart, time out was performed. Appropriate time out taken. . The patient was placed in the lithotomy position and the cervix brought into view with sterile speculum.  Portio of cervix cleansed x 2 with betadine swabs.  A tenaculum was placed in the anterior lip of the cervix.  The uterus was sounded for depth of 6 cm. A pipelle was introduced to into the uterus, suction created,  and an endometrial sample was obtained. All equipment was removed and accounted for.  The patient tolerated the procedure well.     Assessment & Plan:   Problem List Items Addressed  This Visit      Unprioritized   Endometrial thickening on ultrasound    Scant tissue gotten today-has had hysteroscopy and D & C x 2 and endometrial sampling x 3 now without evidence of abnormal process. Further w/u is probably not necessary without bleeding.         Total face-to-face time with patient: 10 minutes. Over 50% of encounter was spent on counseling and coordination of care. Return if symptoms worsen or fail to improve.  Donnamae Jude 01/25/2017 11:09 AM

## 2017-01-25 NOTE — Patient Instructions (Signed)

## 2017-01-26 LAB — HIV-1 RNA QUANT-NO REFLEX-BLD
HIV 1 RNA QUANT: 30 {copies}/mL — AB
HIV-1 RNA QUANT, LOG: 1.48 {Log_copies}/mL — AB

## 2017-01-28 ENCOUNTER — Other Ambulatory Visit (HOSPITAL_BASED_OUTPATIENT_CLINIC_OR_DEPARTMENT_OTHER): Payer: Medicare HMO

## 2017-01-28 ENCOUNTER — Other Ambulatory Visit (HOSPITAL_COMMUNITY)
Admission: RE | Admit: 2017-01-28 | Discharge: 2017-01-28 | Disposition: A | Discharge status: Home / Self Care | Payer: Medicare HMO | Source: Ambulatory Visit | Attending: Oncology | Admitting: Oncology

## 2017-01-28 ENCOUNTER — Ambulatory Visit (HOSPITAL_BASED_OUTPATIENT_CLINIC_OR_DEPARTMENT_OTHER): Payer: Medicare HMO

## 2017-01-28 VITALS — BP 142/83 | HR 63 | Temp 97.7°F | Resp 16

## 2017-01-28 DIAGNOSIS — R938 Abnormal findings on diagnostic imaging of other specified body structures: Secondary | ICD-10-CM | POA: Diagnosis present

## 2017-01-28 DIAGNOSIS — Z171 Estrogen receptor negative status [ER-]: Principal | ICD-10-CM

## 2017-01-28 DIAGNOSIS — C50411 Malignant neoplasm of upper-outer quadrant of right female breast: Secondary | ICD-10-CM

## 2017-01-28 DIAGNOSIS — N858 Other specified noninflammatory disorders of uterus: Secondary | ICD-10-CM | POA: Insufficient documentation

## 2017-01-28 DIAGNOSIS — C773 Secondary and unspecified malignant neoplasm of axilla and upper limb lymph nodes: Secondary | ICD-10-CM | POA: Diagnosis not present

## 2017-01-28 DIAGNOSIS — Z5111 Encounter for antineoplastic chemotherapy: Secondary | ICD-10-CM

## 2017-01-28 DIAGNOSIS — C50911 Malignant neoplasm of unspecified site of right female breast: Secondary | ICD-10-CM

## 2017-01-28 DIAGNOSIS — B20 Human immunodeficiency virus [HIV] disease: Secondary | ICD-10-CM

## 2017-01-28 LAB — CBC WITH DIFFERENTIAL/PLATELET
BASO%: 0.5 % (ref 0.0–2.0)
Basophils Absolute: 0 10*3/uL (ref 0.0–0.1)
EOS%: 0.5 % (ref 0.0–7.0)
Eosinophils Absolute: 0 10*3/uL (ref 0.0–0.5)
HCT: 31 % — ABNORMAL LOW (ref 34.8–46.6)
HGB: 10.4 g/dL — ABNORMAL LOW (ref 11.6–15.9)
LYMPH#: 2.7 10*3/uL (ref 0.9–3.3)
LYMPH%: 30.3 % (ref 14.0–49.7)
MCH: 34.3 pg — ABNORMAL HIGH (ref 25.1–34.0)
MCHC: 33.6 g/dL (ref 31.5–36.0)
MCV: 102.2 fL — AB (ref 79.5–101.0)
MONO#: 1 10*3/uL — AB (ref 0.1–0.9)
MONO%: 11.6 % (ref 0.0–14.0)
NEUT%: 57.1 % (ref 38.4–76.8)
NEUTROS ABS: 5.1 10*3/uL (ref 1.5–6.5)
PLATELETS: 221 10*3/uL (ref 145–400)
RBC: 3.03 10*6/uL — AB (ref 3.70–5.45)
RDW: 20.1 % — ABNORMAL HIGH (ref 11.2–14.5)
WBC: 9 10*3/uL (ref 3.9–10.3)

## 2017-01-28 LAB — COMPREHENSIVE METABOLIC PANEL
ALT: 19 U/L (ref 0–55)
AST: 17 U/L (ref 5–34)
Albumin: 3.5 g/dL (ref 3.5–5.0)
Alkaline Phosphatase: 105 U/L (ref 40–150)
Anion Gap: 7 mEq/L (ref 3–11)
BUN: 16.1 mg/dL (ref 7.0–26.0)
CHLORIDE: 105 meq/L (ref 98–109)
CO2: 30 meq/L — AB (ref 22–29)
Calcium: 10.5 mg/dL — ABNORMAL HIGH (ref 8.4–10.4)
Creatinine: 1.5 mg/dL — ABNORMAL HIGH (ref 0.6–1.1)
EGFR: 44 mL/min/{1.73_m2} — AB (ref >90)
Glucose: 87 mg/dl (ref 70–140)
POTASSIUM: 4.6 meq/L (ref 3.5–5.1)
SODIUM: 141 meq/L (ref 136–145)
Total Bilirubin: 0.26 mg/dL (ref 0.20–1.20)
Total Protein: 7 g/dL (ref 6.4–8.3)

## 2017-01-28 MED ORDER — SODIUM CHLORIDE 0.9 % IV SOLN
2400.0000 mg | Freq: Once | INTRAVENOUS | Status: AC
  Administered 2017-01-28: 2400 mg via INTRAVENOUS
  Filled 2017-01-28: qty 63.12

## 2017-01-28 MED ORDER — PALONOSETRON HCL INJECTION 0.25 MG/5ML
INTRAVENOUS | Status: AC
  Filled 2017-01-28: qty 5

## 2017-01-28 MED ORDER — SODIUM CHLORIDE 0.9 % IV SOLN
198.6000 mg | Freq: Once | INTRAVENOUS | Status: AC
  Administered 2017-01-28: 200 mg via INTRAVENOUS
  Filled 2017-01-28: qty 20

## 2017-01-28 MED ORDER — PEGFILGRASTIM 6 MG/0.6ML ~~LOC~~ PSKT
6.0000 mg | PREFILLED_SYRINGE | Freq: Once | SUBCUTANEOUS | Status: AC
  Administered 2017-01-28: 6 mg via SUBCUTANEOUS
  Filled 2017-01-28: qty 0.6

## 2017-01-28 MED ORDER — SODIUM CHLORIDE 0.9 % IV SOLN
Freq: Once | INTRAVENOUS | Status: AC
  Administered 2017-01-28: 13:00:00 via INTRAVENOUS

## 2017-01-28 MED ORDER — PALONOSETRON HCL INJECTION 0.25 MG/5ML
0.2500 mg | Freq: Once | INTRAVENOUS | Status: AC
  Administered 2017-01-28: 0.25 mg via INTRAVENOUS

## 2017-01-28 MED ORDER — HEPARIN SOD (PORK) LOCK FLUSH 100 UNIT/ML IV SOLN
500.0000 [IU] | Freq: Once | INTRAVENOUS | Status: AC | PRN
  Administered 2017-01-28: 500 [IU]
  Filled 2017-01-28: qty 5

## 2017-01-28 MED ORDER — DEXAMETHASONE SODIUM PHOSPHATE 10 MG/ML IJ SOLN
10.0000 mg | Freq: Once | INTRAMUSCULAR | Status: AC
  Administered 2017-01-28: 10 mg via INTRAVENOUS

## 2017-01-28 MED ORDER — SODIUM CHLORIDE 0.9% FLUSH
10.0000 mL | INTRAVENOUS | Status: DC | PRN
  Administered 2017-01-28: 10 mL
  Filled 2017-01-28: qty 10

## 2017-01-28 MED ORDER — DEXAMETHASONE SODIUM PHOSPHATE 10 MG/ML IJ SOLN
INTRAMUSCULAR | Status: AC
  Filled 2017-01-28: qty 1

## 2017-01-28 NOTE — Progress Notes (Signed)
Patient reports falling at home one week ago at night when she woke up to use bathroom, she did not put her shoe on completely. She braced her fall and did not hit her head, no loss of consciousness. She believes she hit and possibly twisted her right ankle. Right ankle is mildly tender and swollen today, extending up to right calf. Denies calf tenderness or pain with ambulation. No warmth or erythema. She has ortho apt scheduled on 8/14 to check her left knee, previously replaced, and to evaluate ankle.

## 2017-01-28 NOTE — Patient Instructions (Signed)
[facility] Discharge Instructions for Patients Receiving Chemotherapy  Today you received the following chemotherapy agents: Gemzar and Carboplatin.  To help prevent nausea and vomiting after your treatment, we encourage you to take your nausea medication: Compazine. Take one every 6 hours as needed. If you develop nausea and vomiting that is not controlled by your nausea medication, call the clinic.   BELOW ARE SYMPTOMS THAT SHOULD BE REPORTED IMMEDIATELY:  *FEVER GREATER THAN 100.5 F  *CHILLS WITH OR WITHOUT FEVER  NAUSEA AND VOMITING THAT IS NOT CONTROLLED WITH YOUR NAUSEA MEDICATION  *UNUSUAL SHORTNESS OF BREATH  *UNUSUAL BRUISING OR BLEEDING  TENDERNESS IN MOUTH AND THROAT WITH OR WITHOUT PRESENCE OF ULCERS  *URINARY PROBLEMS  *BOWEL PROBLEMS  UNUSUAL RASH Items with * indicate a potential emergency and should be followed up as soon as possible.  Feel free to call the clinic should you have any questions or concerns. The clinic phone number is [phone].  Please show the [place] at check-in to the Emergency Department and triage nurse.

## 2017-01-29 ENCOUNTER — Other Ambulatory Visit: Payer: Self-pay | Admitting: Pharmacist

## 2017-01-29 ENCOUNTER — Telehealth: Payer: Self-pay | Admitting: Pharmacist

## 2017-01-29 DIAGNOSIS — N939 Abnormal uterine and vaginal bleeding, unspecified: Secondary | ICD-10-CM | POA: Diagnosis not present

## 2017-01-29 DIAGNOSIS — B20 Human immunodeficiency virus [HIV] disease: Secondary | ICD-10-CM

## 2017-01-29 LAB — T-HELPER CELLS (CD4) COUNT (NOT AT ARMC)
CD4 % Helper T Cell: 39 % (ref 33–55)
CD4 T Cell Abs: 1240 /uL (ref 400–2700)

## 2017-01-29 MED ORDER — SULFAMETHOXAZOLE-TRIMETHOPRIM 800-160 MG PO TABS: 1.0000 | ORAL_TABLET | Freq: Every day | ORAL | 5 refills | Status: DC

## 2017-01-29 NOTE — Addendum Note (Signed)
Addended by: Gretchen Short on: 01/29/2017 08:40 AM   Modules accepted: Orders

## 2017-01-29 NOTE — Telephone Encounter (Signed)
Thanks so much Cassie!

## 2017-01-29 NOTE — Telephone Encounter (Signed)
Kayla Price's CD4 count dropped dramatically since last checked 3 weeks ago 1520>70. She is taking chemotherapy. Per Dr. Tommy Medal, will send in Bactrim for PCP prophylaxis for her. Called Kayla Price to let her know.  Will send to [facility]. Told her to continue until she sees Dr. Tommy Medal in September.

## 2017-01-30 LAB — T-HELPER CELLS (CD4) COUNT (NOT AT ARMC)

## 2017-02-04 ENCOUNTER — Other Ambulatory Visit: Payer: Self-pay | Admitting: *Deleted

## 2017-02-05 DIAGNOSIS — M25571 Pain in right ankle and joints of right foot: Secondary | ICD-10-CM | POA: Diagnosis not present

## 2017-02-11 ENCOUNTER — Other Ambulatory Visit (HOSPITAL_BASED_OUTPATIENT_CLINIC_OR_DEPARTMENT_OTHER): Payer: Medicare HMO

## 2017-02-11 ENCOUNTER — Ambulatory Visit (HOSPITAL_BASED_OUTPATIENT_CLINIC_OR_DEPARTMENT_OTHER): Payer: Medicare HMO

## 2017-02-11 ENCOUNTER — Ambulatory Visit (HOSPITAL_BASED_OUTPATIENT_CLINIC_OR_DEPARTMENT_OTHER): Payer: Medicare HMO | Admitting: Oncology

## 2017-02-11 ENCOUNTER — Other Ambulatory Visit: Payer: Self-pay | Admitting: Oncology

## 2017-02-11 VITALS — BP 142/95 | HR 74 | Temp 98.2°F | Resp 18 | Ht 67.0 in | Wt 251.6 lb

## 2017-02-11 DIAGNOSIS — C773 Secondary and unspecified malignant neoplasm of axilla and upper limb lymph nodes: Secondary | ICD-10-CM | POA: Diagnosis not present

## 2017-02-11 DIAGNOSIS — Z5111 Encounter for antineoplastic chemotherapy: Secondary | ICD-10-CM

## 2017-02-11 DIAGNOSIS — B20 Human immunodeficiency virus [HIV] disease: Secondary | ICD-10-CM | POA: Diagnosis not present

## 2017-02-11 DIAGNOSIS — C50411 Malignant neoplasm of upper-outer quadrant of right female breast: Secondary | ICD-10-CM

## 2017-02-11 DIAGNOSIS — Z171 Estrogen receptor negative status [ER-]: Secondary | ICD-10-CM | POA: Diagnosis not present

## 2017-02-11 DIAGNOSIS — R7989 Other specified abnormal findings of blood chemistry: Secondary | ICD-10-CM | POA: Diagnosis not present

## 2017-02-11 DIAGNOSIS — C50911 Malignant neoplasm of unspecified site of right female breast: Secondary | ICD-10-CM

## 2017-02-11 DIAGNOSIS — M25471 Effusion, right ankle: Secondary | ICD-10-CM | POA: Diagnosis not present

## 2017-02-11 LAB — COMPREHENSIVE METABOLIC PANEL
ALT: 19 U/L (ref 0–55)
ANION GAP: 6 meq/L (ref 3–11)
AST: 19 U/L (ref 5–34)
Albumin: 3.5 g/dL (ref 3.5–5.0)
Alkaline Phosphatase: 127 U/L (ref 40–150)
BILIRUBIN TOTAL: 0.27 mg/dL (ref 0.20–1.20)
BUN: 14.9 mg/dL (ref 7.0–26.0)
CHLORIDE: 105 meq/L (ref 98–109)
CO2: 29 meq/L (ref 22–29)
Calcium: 10.3 mg/dL (ref 8.4–10.4)
Creatinine: 1.6 mg/dL — ABNORMAL HIGH (ref 0.6–1.1)
EGFR: 41 mL/min/{1.73_m2} — AB (ref >90)
Glucose: 99 mg/dl (ref 70–140)
POTASSIUM: 4 meq/L (ref 3.5–5.1)
Sodium: 141 mEq/L (ref 136–145)
Total Protein: 7.1 g/dL (ref 6.4–8.3)

## 2017-02-11 LAB — CBC WITH DIFFERENTIAL/PLATELET
BASO%: 0.3 % (ref 0.0–2.0)
Basophils Absolute: 0 10*3/uL (ref 0.0–0.1)
EOS ABS: 0 10*3/uL (ref 0.0–0.5)
EOS%: 0.3 % (ref 0.0–7.0)
HCT: 30 % — ABNORMAL LOW (ref 34.8–46.6)
HGB: 9.7 g/dL — ABNORMAL LOW (ref 11.6–15.9)
LYMPH%: 13.9 % — AB (ref 14.0–49.7)
MCH: 33.8 pg (ref 25.1–34.0)
MCHC: 32.3 g/dL (ref 31.5–36.0)
MCV: 104.5 fL — AB (ref 79.5–101.0)
MONO#: 1.4 10*3/uL — AB (ref 0.1–0.9)
MONO%: 10.3 % (ref 0.0–14.0)
NEUT#: 10.5 10*3/uL — ABNORMAL HIGH (ref 1.5–6.5)
NEUT%: 75.2 % (ref 38.4–76.8)
PLATELETS: 99 10*3/uL — AB (ref 145–400)
RBC: 2.87 10*6/uL — AB (ref 3.70–5.45)
RDW: 21.1 % — ABNORMAL HIGH (ref 11.2–14.5)
WBC: 14 10*3/uL — ABNORMAL HIGH (ref 3.9–10.3)
lymph#: 2 10*3/uL (ref 0.9–3.3)

## 2017-02-11 MED ORDER — HEPARIN SOD (PORK) LOCK FLUSH 100 UNIT/ML IV SOLN
500.0000 [IU] | Freq: Once | INTRAVENOUS | Status: AC | PRN
  Administered 2017-02-11: 500 [IU]
  Filled 2017-02-11: qty 5

## 2017-02-11 MED ORDER — DEXAMETHASONE SODIUM PHOSPHATE 10 MG/ML IJ SOLN
INTRAMUSCULAR | Status: AC
  Filled 2017-02-11: qty 1

## 2017-02-11 MED ORDER — SODIUM CHLORIDE 0.9 % IV SOLN
151.5200 mg | Freq: Once | INTRAVENOUS | Status: AC
  Administered 2017-02-11: 150 mg via INTRAVENOUS
  Filled 2017-02-11: qty 15

## 2017-02-11 MED ORDER — PALONOSETRON HCL INJECTION 0.25 MG/5ML
INTRAVENOUS | Status: AC
  Filled 2017-02-11: qty 5

## 2017-02-11 MED ORDER — DEXAMETHASONE SODIUM PHOSPHATE 10 MG/ML IJ SOLN
10.0000 mg | Freq: Once | INTRAMUSCULAR | Status: AC
  Administered 2017-02-11: 10 mg via INTRAVENOUS

## 2017-02-11 MED ORDER — PALONOSETRON HCL INJECTION 0.25 MG/5ML
0.2500 mg | Freq: Once | INTRAVENOUS | Status: AC
  Administered 2017-02-11: 0.25 mg via INTRAVENOUS

## 2017-02-11 MED ORDER — GEMCITABINE HCL CHEMO INJECTION 1 GM/26.3ML
2400.0000 mg | Freq: Once | INTRAVENOUS | Status: AC
  Administered 2017-02-11: 2400 mg via INTRAVENOUS
  Filled 2017-02-11: qty 63.12

## 2017-02-11 MED ORDER — SODIUM CHLORIDE 0.9 % IV SOLN
Freq: Once | INTRAVENOUS | Status: AC
  Administered 2017-02-11: 11:00:00 via INTRAVENOUS

## 2017-02-11 MED ORDER — SODIUM CHLORIDE 0.9% FLUSH
10.0000 mL | INTRAVENOUS | Status: DC | PRN
  Administered 2017-02-11: 10 mL
  Filled 2017-02-11: qty 10

## 2017-02-11 NOTE — Progress Notes (Addendum)
[facility]  Telephone:([phone]) [phone] Fax:([phone]) [phone]     ID: Kayla Price DOB: 01-30-1956  MR#: [id number]  KTG#:[id number]  Patient Care Team: Lauree Chandler, NP as PCP - General (Nurse Practitioner) Tommy Medal, Lavell Islam, MD as PCP - Infectious Diseases (Infectious Diseases) Clent Jacks, MD as Consulting Physician (Ophthalmology) Renato Shin, MD as Consulting Physician (Endocrinology) Frederik Pear, MD as Consulting Physician (Orthopedic Surgery) Sandara Tyree, Virgie Dad, MD as Consulting Physician (Oncology) Fanny Skates, MD as Consulting Physician (General Surgery) Donnamae Jude, MD as Consulting Physician (Obstetrics and Gynecology) Chauncey Cruel, MD OTHER MD:  CHIEF COMPLAINT: Triple negative breast cancer, recurrent  CURRENT TREATMENT: Adjuvant chemotherapy  INTERVAL HISTORY: Kayla Price returns today for follow-up and treatment of her estrogen receptor negative breast cancer. Today is day 1 cycle 5 of 6 planned cycles of gemcitabine and carboplatin which she receives on days 1 and 8 of each 21 day cycle, with Neupogen on days 2 and 3 and 4 and Neulasta on day 9.  She tolerated the fourth cycle well, with no unusual side effects. She tells me her vision is now better since she got glasses. She has a becoming right upper extremity compression sleeve, which is working well for her. There have been no intercurrent infections.  Since her last visit here of course she had her transvaginal pelvic ultrasound which showed a thickened complex appearing endometrium. This was biopsied 01/29/2017 and showed only atrophic endometrium with no hyperplasia or malignancy.  REVIEW OF SYSTEMS: Talma is still occasionally constipated. The bowel movements are not frankly hard, but they are little less frequent than they used to be. She fell recently, walking from one room to another and twisted her right ankle. This was swollen. She was evaluated by Frederik Pear for  this. The patient tells me that she had films at his office that showed no fracture. She understands soft tissue swelling may last for several weeks after a sprain.  BREAST CANCER HISTORY: From the original intake note:  Kayla Price is a history of right-sided breast cancer dating back to 2005. She had a lumpectomy with sentinel lymph node sampling, chemotherapy, and radiation. I do not have access to those records at present.  More recently she had bilateral screening mammography at the [facility] 09/25/2016 showing a possible mass in the right breast. Diagnostic mammography with ultrasonography on 09/28/2016 the patient underwent right diagnostic mammography with tomography and right breast ultrasonography. The breast density was category A. In the right breast at the 10:00 position there was an irregular mass measuring 2.5 cm. Ultrasound identified this the 10:00 radiant 10 cm from the nipple measuring 2.4 cm. In the right axilla there was an abnormal lymph node measuring 1.3 cm with other normal-appearing lymph nodes.  On 10/01/2016 she  underwent biopsy of the right breast mass in question as well as the suspicious axillary lymph node. 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 The proliferation marker was 70% in the breast lesion and 50% in the lymph node.  Her subsequent history is as detailed below.   PAST MEDICAL HISTORY: Past Medical History:  Diagnosis Date  . Alopecia areata 11/28/2009  . Bell's palsy   . BREAST CANCER, HX OF 2005  . Cancer [facility]) 2005   Breast cancer   chemotherapy and radiation  . CKD (chronic kidney disease) stage 3, GFR 30-59 ml/min 06/08/2015  .  [place]  Telephone:([phone]) [phone] [phone]     ID: Kayla Price DOB: 01-30-1956  MR#: [id number]  KTG#:[id number]  Patient Care Team: Lauree Chandler, NP as PCP - General (Nurse Practitioner) Tommy Medal, Lavell Islam, MD as PCP - Infectious Diseases (Infectious Diseases) Clent Jacks, MD as Consulting Physician (Ophthalmology) Renato Shin, MD as Consulting Physician (Endocrinology) Frederik Pear, MD as Consulting Physician (Orthopedic Surgery) Sandara Tyree, Virgie Dad, MD as Consulting Physician (Oncology) Fanny Skates, MD as Consulting Physician (General Surgery) Donnamae Jude, MD as Consulting Physician (Obstetrics and Gynecology) Chauncey Cruel, MD OTHER MD:  CHIEF COMPLAINT: Triple negative breast cancer, recurrent  CURRENT TREATMENT: Adjuvant chemotherapy  INTERVAL HISTORY: Kayla Price returns today for follow-up and treatment of her estrogen receptor negative breast cancer. Today is day 1 cycle 5 of 6 planned cycles of gemcitabine and carboplatin which she receives on days 1 and 8 of each 21 day cycle, with Neupogen on days 2 and 3 and 4 and Neulasta on day 9.  She tolerated the fourth cycle well, with no unusual side effects. She tells me her vision is now better since she got glasses. She has a becoming right upper extremity compression sleeve, which is working well for her. There have been no intercurrent infections.  Since her last visit here of course she had her transvaginal pelvic ultrasound which showed a thickened complex appearing endometrium. This was biopsied 01/29/2017 and showed only atrophic endometrium with no hyperplasia or malignancy.  REVIEW OF SYSTEMS: Talma is still occasionally constipated. The bowel movements are not frankly hard, but they are little less frequent than they used to be. She fell recently, walking from one room to another and twisted her right ankle. This was swollen. She was evaluated by Frederik Pear for  this. The patient tells me that she had films at his office that showed no fracture. She understands soft tissue swelling may last for several weeks after a sprain.  BREAST CANCER HISTORY: From the original intake note:  Kayla Price is a history of right-sided breast cancer dating back to 2005. She had a lumpectomy with sentinel lymph node sampling, chemotherapy, and radiation. I do not have access to those records at present.  More recently she had bilateral screening mammography at the [facility] 09/25/2016 showing a possible mass in the right breast. Diagnostic mammography with ultrasonography on 09/28/2016 the patient underwent right diagnostic mammography with tomography and right breast ultrasonography. The breast density was category A. In the right breast at the 10:00 position there was an irregular mass measuring 2.5 cm. Ultrasound identified this the 10:00 radiant 10 cm from the nipple measuring 2.4 cm. In the right axilla there was an abnormal lymph node measuring 1.3 cm with other normal-appearing lymph nodes.  On 10/01/2016 she  underwent biopsy of the right breast mass in question as well as the suspicious axillary lymph node. 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 The proliferation marker was 70% in the breast lesion and 50% in the lymph node.  Her subsequent history is as detailed below.   PAST MEDICAL HISTORY: Past Medical History:  Diagnosis Date  . Alopecia areata 11/28/2009  . Bell's palsy   . BREAST CANCER, HX OF 2005  . Cancer [facility]) 2005   Breast cancer   chemotherapy and radiation  . CKD (chronic kidney disease) stage 3, GFR 30-59 ml/min 06/08/2015  .  [place]  Telephone:(336) [phone] Fax:([phone]) [phone]     ID: Kayla Price DOB: 01-30-1956  MR#: [id number]  KTG#:[id number]  Patient Care Team: Lauree Chandler, NP as PCP - General (Nurse Practitioner) Tommy Medal, Lavell Islam, MD as PCP - Infectious Diseases (Infectious Diseases) Clent Jacks, MD as Consulting Physician (Ophthalmology) Renato Shin, MD as Consulting Physician (Endocrinology) Frederik Pear, MD as Consulting Physician (Orthopedic Surgery) Sandara Tyree, Virgie Dad, MD as Consulting Physician (Oncology) Fanny Skates, MD as Consulting Physician (General Surgery) Donnamae Jude, MD as Consulting Physician (Obstetrics and Gynecology) Chauncey Cruel, MD OTHER MD:  CHIEF COMPLAINT: Triple negative breast cancer, recurrent  CURRENT TREATMENT: Adjuvant chemotherapy  INTERVAL HISTORY: Kayla Price returns today for follow-up and treatment of her estrogen receptor negative breast cancer. Today is day 1 cycle 5 of 6 planned cycles of gemcitabine and carboplatin which she receives on days 1 and 8 of each 21 day cycle, with Neupogen on days 2 and 3 and 4 and Neulasta on day 9.  She tolerated the fourth cycle well, with no unusual side effects. She tells me her vision is now better since she got glasses. She has a becoming right upper extremity compression sleeve, which is working well for her. There have been no intercurrent infections.  Since her last visit here of course she had her transvaginal pelvic ultrasound which showed a thickened complex appearing endometrium. This was biopsied 01/29/2017 and showed only atrophic endometrium with no hyperplasia or malignancy.  REVIEW OF SYSTEMS: Talma is still occasionally constipated. The bowel movements are not frankly hard, but they are little less frequent than they used to be. She fell recently, walking from one room to another and twisted her right ankle. This was swollen. She was evaluated by Frederik Pear for  this. The patient tells me that she had films at his office that showed no fracture. She understands soft tissue swelling may last for several weeks after a sprain.  BREAST CANCER HISTORY: From the original intake note:  Kayla Price is a history of right-sided breast cancer dating back to 2005. She had a lumpectomy with sentinel lymph node sampling, chemotherapy, and radiation. I do not have access to those records at present.  More recently she had bilateral screening mammography at the [facility] 09/25/2016 showing a possible mass in the right breast. Diagnostic mammography with ultrasonography on 09/28/2016 the patient underwent right diagnostic mammography with tomography and right breast ultrasonography. The breast density was category A. In the right breast at the 10:00 position there was an irregular mass measuring 2.5 cm. Ultrasound identified this the 10:00 radiant 10 cm from the nipple measuring 2.4 cm. In the right axilla there was an abnormal lymph node measuring 1.3 cm with other normal-appearing lymph nodes.  On 10/01/2016 she  underwent biopsy of the right breast mass in question as well as the suspicious axillary lymph node. 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 The proliferation marker was 70% in the breast lesion and 50% in the lymph node.  Her subsequent history is as detailed below.   PAST MEDICAL HISTORY: Past Medical History:  Diagnosis Date  . Alopecia areata 11/28/2009  . Bell's palsy   . BREAST CANCER, HX OF 2005  . Cancer [facility]) 2005   Breast cancer   chemotherapy and radiation  . CKD (chronic kidney disease) stage 3, GFR 30-59 ml/min 06/08/2015  .  [place]  Telephone:(336) [phone] Fax:([phone]) [phone]     ID: Kayla Price DOB: 01-30-1956  MR#: [id number]  KTG#:[id number]  Patient Care Team: Lauree Chandler, NP as PCP - General (Nurse Practitioner) Tommy Medal, Lavell Islam, MD as PCP - Infectious Diseases (Infectious Diseases) Clent Jacks, MD as Consulting Physician (Ophthalmology) Renato Shin, MD as Consulting Physician (Endocrinology) Frederik Pear, MD as Consulting Physician (Orthopedic Surgery) Sandara Tyree, Virgie Dad, MD as Consulting Physician (Oncology) Fanny Skates, MD as Consulting Physician (General Surgery) Donnamae Jude, MD as Consulting Physician (Obstetrics and Gynecology) Chauncey Cruel, MD OTHER MD:  CHIEF COMPLAINT: Triple negative breast cancer, recurrent  CURRENT TREATMENT: Adjuvant chemotherapy  INTERVAL HISTORY: Kayla Price returns today for follow-up and treatment of her estrogen receptor negative breast cancer. Today is day 1 cycle 5 of 6 planned cycles of gemcitabine and carboplatin which she receives on days 1 and 8 of each 21 day cycle, with Neupogen on days 2 and 3 and 4 and Neulasta on day 9.  She tolerated the fourth cycle well, with no unusual side effects. She tells me her vision is now better since she got glasses. She has a becoming right upper extremity compression sleeve, which is working well for her. There have been no intercurrent infections.  Since her last visit here of course she had her transvaginal pelvic ultrasound which showed a thickened complex appearing endometrium. This was biopsied 01/29/2017 and showed only atrophic endometrium with no hyperplasia or malignancy.  REVIEW OF SYSTEMS: Talma is still occasionally constipated. The bowel movements are not frankly hard, but they are little less frequent than they used to be. She fell recently, walking from one room to another and twisted her right ankle. This was swollen. She was evaluated by Frederik Pear for  this. The patient tells me that she had films at his office that showed no fracture. She understands soft tissue swelling may last for several weeks after a sprain.  BREAST CANCER HISTORY: From the original intake note:  Kayla Price is a history of right-sided breast cancer dating back to 2005. She had a lumpectomy with sentinel lymph node sampling, chemotherapy, and radiation. I do not have access to those records at present.  More recently she had bilateral screening mammography at the [facility] 09/25/2016 showing a possible mass in the right breast. Diagnostic mammography with ultrasonography on 09/28/2016 the patient underwent right diagnostic mammography with tomography and right breast ultrasonography. The breast density was category A. In the right breast at the 10:00 position there was an irregular mass measuring 2.5 cm. Ultrasound identified this the 10:00 radiant 10 cm from the nipple measuring 2.4 cm. In the right axilla there was an abnormal lymph node measuring 1.3 cm with other normal-appearing lymph nodes.  On 10/01/2016 she  underwent biopsy of the right breast mass in question as well as the suspicious axillary lymph node. 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 The proliferation marker was 70% in the breast lesion and 50% in the lymph node.  Her subsequent history is as detailed below.   PAST MEDICAL HISTORY: Past Medical History:  Diagnosis Date  . Alopecia areata 11/28/2009  . Bell's palsy   . BREAST CANCER, HX OF 2005  . Cancer [facility]) 2005   Breast cancer   chemotherapy and radiation  . CKD (chronic kidney disease) stage 3, GFR 30-59 ml/min 06/08/2015  .  [place]  Telephone:(336) [phone] Fax:([phone]) [phone]     ID: Kayla Price DOB: 01-30-1956  MR#: [id number]  KTG#:[id number]  Patient Care Team: Lauree Chandler, NP as PCP - General (Nurse Practitioner) Tommy Medal, Lavell Islam, MD as PCP - Infectious Diseases (Infectious Diseases) Clent Jacks, MD as Consulting Physician (Ophthalmology) Renato Shin, MD as Consulting Physician (Endocrinology) Frederik Pear, MD as Consulting Physician (Orthopedic Surgery) Sandara Tyree, Virgie Dad, MD as Consulting Physician (Oncology) Fanny Skates, MD as Consulting Physician (General Surgery) Donnamae Jude, MD as Consulting Physician (Obstetrics and Gynecology) Chauncey Cruel, MD OTHER MD:  CHIEF COMPLAINT: Triple negative breast cancer, recurrent  CURRENT TREATMENT: Adjuvant chemotherapy  INTERVAL HISTORY: Kayla Price returns today for follow-up and treatment of her estrogen receptor negative breast cancer. Today is day 1 cycle 5 of 6 planned cycles of gemcitabine and carboplatin which she receives on days 1 and 8 of each 21 day cycle, with Neupogen on days 2 and 3 and 4 and Neulasta on day 9.  She tolerated the fourth cycle well, with no unusual side effects. She tells me her vision is now better since she got glasses. She has a becoming right upper extremity compression sleeve, which is working well for her. There have been no intercurrent infections.  Since her last visit here of course she had her transvaginal pelvic ultrasound which showed a thickened complex appearing endometrium. This was biopsied 01/29/2017 and showed only atrophic endometrium with no hyperplasia or malignancy.  REVIEW OF SYSTEMS: Talma is still occasionally constipated. The bowel movements are not frankly hard, but they are little less frequent than they used to be. She fell recently, walking from one room to another and twisted her right ankle. This was swollen. She was evaluated by Frederik Pear for  this. The patient tells me that she had films at his office that showed no fracture. She understands soft tissue swelling may last for several weeks after a sprain.  BREAST CANCER HISTORY: From the original intake note:  Kayla Price is a history of right-sided breast cancer dating back to 2005. She had a lumpectomy with sentinel lymph node sampling, chemotherapy, and radiation. I do not have access to those records at present.  More recently she had bilateral screening mammography at the [facility] 09/25/2016 showing a possible mass in the right breast. Diagnostic mammography with ultrasonography on 09/28/2016 the patient underwent right diagnostic mammography with tomography and right breast ultrasonography. The breast density was category A. In the right breast at the 10:00 position there was an irregular mass measuring 2.5 cm. Ultrasound identified this the 10:00 radiant 10 cm from the nipple measuring 2.4 cm. In the right axilla there was an abnormal lymph node measuring 1.3 cm with other normal-appearing lymph nodes.  On 10/01/2016 she  underwent biopsy of the right breast mass in question as well as the suspicious axillary lymph node. 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 The proliferation marker was 70% in the breast lesion and 50% in the lymph node.  Her subsequent history is as detailed below.   PAST MEDICAL HISTORY: Past Medical History:  Diagnosis Date  . Alopecia areata 11/28/2009  . Bell's palsy   . BREAST CANCER, HX OF 2005  . Cancer [facility]) 2005   Breast cancer   chemotherapy and radiation  . CKD (chronic kidney disease) stage 3, GFR 30-59 ml/min 06/08/2015  .

## 2017-02-11 NOTE — Addendum Note (Signed)
Addended by: Chauncey Cruel on: 02/11/2017 11:18 AM   Modules accepted: Orders

## 2017-02-11 NOTE — Patient Instructions (Signed)
[facility] Discharge Instructions for Patients Receiving Chemotherapy  Today you received the following chemotherapy agents: Gemzar and Carboplatin.  To help prevent nausea and vomiting after your treatment, we encourage you to take your nausea medication: Compazine. Take one every 6 hours as needed. If you develop nausea and vomiting that is not controlled by your nausea medication, call the clinic.   BELOW ARE SYMPTOMS THAT SHOULD BE REPORTED IMMEDIATELY:  *FEVER GREATER THAN 100.5 F  *CHILLS WITH OR WITHOUT FEVER  NAUSEA AND VOMITING THAT IS NOT CONTROLLED WITH YOUR NAUSEA MEDICATION  *UNUSUAL SHORTNESS OF BREATH  *UNUSUAL BRUISING OR BLEEDING  TENDERNESS IN MOUTH AND THROAT WITH OR WITHOUT PRESENCE OF ULCERS  *URINARY PROBLEMS  *BOWEL PROBLEMS  UNUSUAL RASH Items with * indicate a potential emergency and should be followed up as soon as possible.  Feel free to call the clinic should you have any questions or concerns. The clinic phone number is [phone].  Please show the [place] at check-in to the Emergency Department and triage nurse.

## 2017-02-11 NOTE — Progress Notes (Signed)
Per Dr. Jana Hakim, okay to tx with Plt 99 and Ctn 1.6. Carbo dose decreased.

## 2017-02-12 ENCOUNTER — Ambulatory Visit (HOSPITAL_BASED_OUTPATIENT_CLINIC_OR_DEPARTMENT_OTHER): Payer: Medicare HMO

## 2017-02-12 VITALS — BP 143/85 | HR 98 | Temp 97.9°F | Resp 20

## 2017-02-12 DIAGNOSIS — Z5189 Encounter for other specified aftercare: Secondary | ICD-10-CM | POA: Diagnosis not present

## 2017-02-12 DIAGNOSIS — C50411 Malignant neoplasm of upper-outer quadrant of right female breast: Secondary | ICD-10-CM | POA: Diagnosis not present

## 2017-02-12 DIAGNOSIS — C773 Secondary and unspecified malignant neoplasm of axilla and upper limb lymph nodes: Secondary | ICD-10-CM

## 2017-02-12 DIAGNOSIS — C50911 Malignant neoplasm of unspecified site of right female breast: Secondary | ICD-10-CM

## 2017-02-12 DIAGNOSIS — Z171 Estrogen receptor negative status [ER-]: Secondary | ICD-10-CM

## 2017-02-12 MED ORDER — TBO-FILGRASTIM 480 MCG/0.8ML ~~LOC~~ SOSY
480.0000 ug | PREFILLED_SYRINGE | Freq: Once | SUBCUTANEOUS | Status: AC
  Administered 2017-02-12: 480 ug via SUBCUTANEOUS
  Filled 2017-02-12: qty 0.8

## 2017-02-12 NOTE — Patient Instructions (Signed)
Tbo-Filgrastim injection What is this medicine? TBO-FILGRASTIM (T B O fil GRA stim) is a granulocyte colony-stimulating factor that stimulates the growth of neutrophils, a type of white blood cell important in the body's fight against infection. It is used to reduce the incidence of fever and infection in patients with certain types of cancer who are receiving chemotherapy that affects the bone marrow. This medicine may be used for other purposes; ask your health care provider or pharmacist if you have questions. COMMON BRAND NAME(S): Granix What should I tell my health care provider before I take this medicine? They need to know if you have any of these conditions: -bone scan or tests planned -kidney disease -sickle cell anemia -an unusual or allergic reaction to tbo-filgrastim, filgrastim, pegfilgrastim, other medicines, foods, dyes, or preservatives -pregnant or trying to get pregnant -breast-feeding How should I use this medicine? This medicine is for injection under the skin. If you get this medicine at home, you will be taught how to prepare and give this medicine. Refer to the Instructions for Use that come with your medication packaging. Use exactly as directed. Take your medicine at regular intervals. Do not take your medicine more often than directed. It is important that you put your used needles and syringes in a special sharps container. Do not put them in a trash can. If you do not have a sharps container, call your pharmacist or healthcare provider to get one. Talk to your pediatrician regarding the use of this medicine in children. Special care may be needed. Overdosage: If you think you have taken too much of this medicine contact a poison control center or emergency room at once. NOTE: This medicine is only for you. Do not share this medicine with others. What if I miss a dose? It is important not to miss your dose. Call your doctor or health care professional if you miss a  dose. What may interact with this medicine? This medicine may interact with the following medications: -medicines that may cause a release of neutrophils, such as lithium This list may not describe all possible interactions. Give your health care provider a list of all the medicines, herbs, non-prescription drugs, or dietary supplements you use. Also tell them if you smoke, drink alcohol, or use illegal drugs. Some items may interact with your medicine. What should I watch for while using this medicine? You may need blood work done while you are taking this medicine. What side effects may I notice from receiving this medicine? Side effects that you should report to your doctor or health care professional as soon as possible: -allergic reactions like skin rash, itching or hives, swelling of the face, lips, or tongue -blood in the urine -dark urine -dizziness -fast heartbeat -feeling faint -shortness of breath or breathing problems -signs and symptoms of infection like fever or chills; cough; or sore throat -signs and symptoms of kidney injury like trouble passing urine or change in the amount of urine -stomach or side pain, or pain at the shoulder -sweating -swelling of the legs, ankles, or abdomen -tiredness Side effects that usually do not require medical attention (report to your doctor or health care professional if they continue or are bothersome): -bone pain -headache -muscle pain -vomiting This list may not describe all possible side effects. Call your doctor for medical advice about side effects. You may report side effects to FDA at 1-800-FDA-1088. Where should I keep my medicine? Keep out of the reach of children. Store in a refrigerator between   2 and 8 degrees C (36 and 46 degrees F). Keep in carton to protect from light. Throw away this medicine if it is left out of the refrigerator for more than 5 consecutive days. Throw away any unused medicine after the expiration  date. NOTE: This sheet is a summary. It may not cover all possible information. If you have questions about this medicine, talk to your doctor, pharmacist, or health care provider.  2018 Elsevier/Gold Standard (2015-08-01 19:07:04)  

## 2017-02-13 ENCOUNTER — Ambulatory Visit (HOSPITAL_BASED_OUTPATIENT_CLINIC_OR_DEPARTMENT_OTHER): Payer: Medicare HMO

## 2017-02-13 VITALS — BP 119/78 | HR 84 | Temp 97.5°F | Resp 18

## 2017-02-13 DIAGNOSIS — C773 Secondary and unspecified malignant neoplasm of axilla and upper limb lymph nodes: Secondary | ICD-10-CM

## 2017-02-13 DIAGNOSIS — Z171 Estrogen receptor negative status [ER-]: Secondary | ICD-10-CM

## 2017-02-13 DIAGNOSIS — C50411 Malignant neoplasm of upper-outer quadrant of right female breast: Secondary | ICD-10-CM | POA: Diagnosis not present

## 2017-02-13 DIAGNOSIS — Z5189 Encounter for other specified aftercare: Secondary | ICD-10-CM | POA: Diagnosis not present

## 2017-02-13 DIAGNOSIS — C50911 Malignant neoplasm of unspecified site of right female breast: Secondary | ICD-10-CM

## 2017-02-13 MED ORDER — TBO-FILGRASTIM 480 MCG/0.8ML ~~LOC~~ SOSY
480.0000 ug | PREFILLED_SYRINGE | Freq: Once | SUBCUTANEOUS | Status: AC
  Administered 2017-02-13: 480 ug via SUBCUTANEOUS
  Filled 2017-02-13: qty 0.8

## 2017-02-13 NOTE — Patient Instructions (Signed)
Tbo-Filgrastim injection What is this medicine? TBO-FILGRASTIM (T B O fil GRA stim) is a granulocyte colony-stimulating factor that stimulates the growth of neutrophils, a type of white blood cell important in the body's fight against infection. It is used to reduce the incidence of fever and infection in patients with certain types of cancer who are receiving chemotherapy that affects the bone marrow. This medicine may be used for other purposes; ask your health care provider or pharmacist if you have questions. COMMON BRAND NAME(S): Granix What should I tell my health care provider before I take this medicine? They need to know if you have any of these conditions: -bone scan or tests planned -kidney disease -sickle cell anemia -an unusual or allergic reaction to tbo-filgrastim, filgrastim, pegfilgrastim, other medicines, foods, dyes, or preservatives -pregnant or trying to get pregnant -breast-feeding How should I use this medicine? This medicine is for injection under the skin. If you get this medicine at home, you will be taught how to prepare and give this medicine. Refer to the Instructions for Use that come with your medication packaging. Use exactly as directed. Take your medicine at regular intervals. Do not take your medicine more often than directed. It is important that you put your used needles and syringes in a special sharps container. Do not put them in a trash can. If you do not have a sharps container, call your pharmacist or healthcare provider to get one. Talk to your pediatrician regarding the use of this medicine in children. Special care may be needed. Overdosage: If you think you have taken too much of this medicine contact a poison control center or emergency room at once. NOTE: This medicine is only for you. Do not share this medicine with others. What if I miss a dose? It is important not to miss your dose. Call your doctor or health care professional if you miss a  dose. What may interact with this medicine? This medicine may interact with the following medications: -medicines that may cause a release of neutrophils, such as lithium This list may not describe all possible interactions. Give your health care provider a list of all the medicines, herbs, non-prescription drugs, or dietary supplements you use. Also tell them if you smoke, drink alcohol, or use illegal drugs. Some items may interact with your medicine. What should I watch for while using this medicine? You may need blood work done while you are taking this medicine. What side effects may I notice from receiving this medicine? Side effects that you should report to your doctor or health care professional as soon as possible: -allergic reactions like skin rash, itching or hives, swelling of the face, lips, or tongue -blood in the urine -dark urine -dizziness -fast heartbeat -feeling faint -shortness of breath or breathing problems -signs and symptoms of infection like fever or chills; cough; or sore throat -signs and symptoms of kidney injury like trouble passing urine or change in the amount of urine -stomach or side pain, or pain at the shoulder -sweating -swelling of the legs, ankles, or abdomen -tiredness Side effects that usually do not require medical attention (report to your doctor or health care professional if they continue or are bothersome): -bone pain -headache -muscle pain -vomiting This list may not describe all possible side effects. Call your doctor for medical advice about side effects. You may report side effects to FDA at 1-800-FDA-1088. Where should I keep my medicine? Keep out of the reach of children. Store in a refrigerator between   2 and 8 degrees C (36 and 46 degrees F). Keep in carton to protect from light. Throw away this medicine if it is left out of the refrigerator for more than 5 consecutive days. Throw away any unused medicine after the expiration  date. NOTE: This sheet is a summary. It may not cover all possible information. If you have questions about this medicine, talk to your doctor, pharmacist, or health care provider.  2018 Elsevier/Gold Standard (2015-08-01 19:07:04)  

## 2017-02-14 ENCOUNTER — Ambulatory Visit (HOSPITAL_BASED_OUTPATIENT_CLINIC_OR_DEPARTMENT_OTHER): Payer: Medicare HMO

## 2017-02-14 DIAGNOSIS — Z5189 Encounter for other specified aftercare: Secondary | ICD-10-CM | POA: Diagnosis not present

## 2017-02-14 DIAGNOSIS — C50411 Malignant neoplasm of upper-outer quadrant of right female breast: Secondary | ICD-10-CM

## 2017-02-14 DIAGNOSIS — C773 Secondary and unspecified malignant neoplasm of axilla and upper limb lymph nodes: Secondary | ICD-10-CM

## 2017-02-14 DIAGNOSIS — Z171 Estrogen receptor negative status [ER-]: Principal | ICD-10-CM

## 2017-02-14 MED ORDER — TBO-FILGRASTIM 480 MCG/0.8ML ~~LOC~~ SOSY
480.0000 ug | PREFILLED_SYRINGE | Freq: Once | SUBCUTANEOUS | Status: AC
  Administered 2017-02-14: 480 ug via SUBCUTANEOUS
  Filled 2017-02-14: qty 0.8

## 2017-02-14 MED ORDER — FILGRASTIM 480 MCG/0.8ML IJ SOSY: 480.0000 ug | PREFILLED_SYRINGE | Freq: Once | INTRAMUSCULAR | Status: DC

## 2017-02-14 MED FILL — PREZCOBIX 800 MG-150 MG TAB: 800-150 | 30 days supply | Qty: 30 | Fill #6

## 2017-02-14 MED FILL — JULUCA 50-25 MG TAB: 50-25 | 30 days supply | Qty: 30 | Fill #2

## 2017-02-18 ENCOUNTER — Other Ambulatory Visit (HOSPITAL_BASED_OUTPATIENT_CLINIC_OR_DEPARTMENT_OTHER): Payer: Medicare HMO

## 2017-02-18 ENCOUNTER — Other Ambulatory Visit (HOSPITAL_COMMUNITY)
Admission: RE | Admit: 2017-02-18 | Discharge: 2017-02-18 | Disposition: A | Discharge status: Home / Self Care | Payer: Medicare HMO | Source: Ambulatory Visit | Attending: Oncology | Admitting: Oncology

## 2017-02-18 ENCOUNTER — Ambulatory Visit (HOSPITAL_BASED_OUTPATIENT_CLINIC_OR_DEPARTMENT_OTHER): Payer: Medicare HMO

## 2017-02-18 VITALS — BP 145/86 | HR 74 | Temp 98.9°F | Resp 16

## 2017-02-18 DIAGNOSIS — B20 Human immunodeficiency virus [HIV] disease: Secondary | ICD-10-CM

## 2017-02-18 DIAGNOSIS — C50911 Malignant neoplasm of unspecified site of right female breast: Secondary | ICD-10-CM

## 2017-02-18 DIAGNOSIS — C50919 Malignant neoplasm of unspecified site of unspecified female breast: Secondary | ICD-10-CM | POA: Insufficient documentation

## 2017-02-18 DIAGNOSIS — C773 Secondary and unspecified malignant neoplasm of axilla and upper limb lymph nodes: Secondary | ICD-10-CM | POA: Diagnosis not present

## 2017-02-18 DIAGNOSIS — Z171 Estrogen receptor negative status [ER-]: Secondary | ICD-10-CM

## 2017-02-18 DIAGNOSIS — Z5111 Encounter for antineoplastic chemotherapy: Secondary | ICD-10-CM

## 2017-02-18 DIAGNOSIS — C50411 Malignant neoplasm of upper-outer quadrant of right female breast: Secondary | ICD-10-CM

## 2017-02-18 LAB — COMPREHENSIVE METABOLIC PANEL
ALBUMIN: 3.5 g/dL (ref 3.5–5.0)
ALK PHOS: 108 U/L (ref 40–150)
ALT: 24 U/L (ref 0–55)
AST: 21 U/L (ref 5–34)
Anion Gap: 6 mEq/L (ref 3–11)
BILIRUBIN TOTAL: 0.32 mg/dL (ref 0.20–1.20)
BUN: 16.9 mg/dL (ref 7.0–26.0)
CO2: 29 mEq/L (ref 22–29)
CREATININE: 1.5 mg/dL — AB (ref 0.6–1.1)
Calcium: 10.7 mg/dL — ABNORMAL HIGH (ref 8.4–10.4)
Chloride: 105 mEq/L (ref 98–109)
EGFR: 44 mL/min/{1.73_m2} — ABNORMAL LOW (ref >90)
GLUCOSE: 87 mg/dL (ref 70–140)
Potassium: 4.6 mEq/L (ref 3.5–5.1)
SODIUM: 140 meq/L (ref 136–145)
TOTAL PROTEIN: 7.4 g/dL (ref 6.4–8.3)

## 2017-02-18 LAB — CBC WITH DIFFERENTIAL/PLATELET
BASO%: 0.7 % (ref 0.0–2.0)
Basophils Absolute: 0.1 10*3/uL (ref 0.0–0.1)
EOS%: 1 % (ref 0.0–7.0)
Eosinophils Absolute: 0.1 10*3/uL (ref 0.0–0.5)
HCT: 28.7 % — ABNORMAL LOW (ref 34.8–46.6)
HGB: 9.4 g/dL — ABNORMAL LOW (ref 11.6–15.9)
LYMPH%: 35.6 % (ref 14.0–49.7)
MCH: 34.1 pg — ABNORMAL HIGH (ref 25.1–34.0)
MCHC: 32.8 g/dL (ref 31.5–36.0)
MCV: 104 fL — ABNORMAL HIGH (ref 79.5–101.0)
MONO#: 1.2 10*3/uL — ABNORMAL HIGH (ref 0.1–0.9)
MONO%: 16.9 % — AB (ref 0.0–14.0)
NEUT#: 3.3 10*3/uL (ref 1.5–6.5)
NEUT%: 45.8 % (ref 38.4–76.8)
PLATELETS: 149 10*3/uL (ref 145–400)
RBC: 2.76 10*6/uL — AB (ref 3.70–5.45)
RDW: 20.3 % — ABNORMAL HIGH (ref 11.2–14.5)
WBC: 7.2 10*3/uL (ref 3.9–10.3)
lymph#: 2.6 10*3/uL (ref 0.9–3.3)

## 2017-02-18 MED ORDER — SODIUM CHLORIDE 0.9 % IV SOLN
2400.0000 mg | Freq: Once | INTRAVENOUS | Status: AC
  Administered 2017-02-18: 2400 mg via INTRAVENOUS
  Filled 2017-02-18: qty 63.12

## 2017-02-18 MED ORDER — DEXAMETHASONE SODIUM PHOSPHATE 10 MG/ML IJ SOLN
10.0000 mg | Freq: Once | INTRAMUSCULAR | Status: AC
  Administered 2017-02-18: 10 mg via INTRAVENOUS

## 2017-02-18 MED ORDER — SODIUM CHLORIDE 0.9 % IV SOLN
158.8800 mg | Freq: Once | INTRAVENOUS | Status: AC
  Administered 2017-02-18: 160 mg via INTRAVENOUS
  Filled 2017-02-18: qty 16

## 2017-02-18 MED ORDER — DEXAMETHASONE SODIUM PHOSPHATE 10 MG/ML IJ SOLN
INTRAMUSCULAR | Status: AC
Start: 2017-02-18 — End: 2017-02-18
  Filled 2017-02-18: qty 1

## 2017-02-18 MED ORDER — PALONOSETRON HCL INJECTION 0.25 MG/5ML
INTRAVENOUS | Status: AC
  Filled 2017-02-18: qty 5

## 2017-02-18 MED ORDER — SODIUM CHLORIDE 0.9 % IV SOLN
Freq: Once | INTRAVENOUS | Status: AC
  Administered 2017-02-18: 12:00:00 via INTRAVENOUS

## 2017-02-18 MED ORDER — HEPARIN SOD (PORK) LOCK FLUSH 100 UNIT/ML IV SOLN
500.0000 [IU] | Freq: Once | INTRAVENOUS | Status: AC | PRN
  Administered 2017-02-18: 500 [IU]
  Filled 2017-02-18: qty 5

## 2017-02-18 MED ORDER — PEGFILGRASTIM 6 MG/0.6ML ~~LOC~~ PSKT
6.0000 mg | PREFILLED_SYRINGE | Freq: Once | SUBCUTANEOUS | Status: AC
  Administered 2017-02-18: 6 mg via SUBCUTANEOUS
  Filled 2017-02-18: qty 0.6

## 2017-02-18 MED ORDER — SODIUM CHLORIDE 0.9% FLUSH
10.0000 mL | INTRAVENOUS | Status: DC | PRN
  Administered 2017-02-18: 10 mL
  Filled 2017-02-18: qty 10

## 2017-02-18 MED ORDER — PALONOSETRON HCL INJECTION 0.25 MG/5ML
0.2500 mg | Freq: Once | INTRAVENOUS | Status: AC
  Administered 2017-02-18: 0.25 mg via INTRAVENOUS

## 2017-02-19 ENCOUNTER — Other Ambulatory Visit: Payer: Self-pay | Admitting: *Deleted

## 2017-02-19 LAB — T-HELPER CELLS (CD4) COUNT (NOT AT ARMC)
CD4 T CELL ABS: 1200 /uL (ref 400–2700)
CD4 T CELL HELPER: 45 % (ref 33–55)

## 2017-02-19 MED ORDER — HYDROCODONE-ACETAMINOPHEN 5-325 MG PO TABS: 1.0000 | ORAL_TABLET | Freq: Four times a day (QID) | ORAL | 0 refills | Status: DC | PRN

## 2017-02-19 NOTE — Telephone Encounter (Signed)
Patient requested and will pick up NCCSRS Database Checked.  

## 2017-03-04 ENCOUNTER — Ambulatory Visit (HOSPITAL_BASED_OUTPATIENT_CLINIC_OR_DEPARTMENT_OTHER): Payer: Medicare HMO

## 2017-03-04 ENCOUNTER — Inpatient Hospital Stay (HOSPITAL_BASED_OUTPATIENT_CLINIC_OR_DEPARTMENT_OTHER): Payer: Medicare HMO | Admitting: Adult Health

## 2017-03-04 ENCOUNTER — Encounter: Payer: Self-pay | Admitting: Adult Health

## 2017-03-04 ENCOUNTER — Other Ambulatory Visit (HOSPITAL_COMMUNITY)
Admission: RE | Admit: 2017-03-04 | Discharge: 2017-03-04 | Disposition: A | Discharge status: Home / Self Care | Payer: Medicare HMO | Source: Ambulatory Visit | Attending: Oncology | Admitting: Oncology

## 2017-03-04 ENCOUNTER — Inpatient Hospital Stay (HOSPITAL_BASED_OUTPATIENT_CLINIC_OR_DEPARTMENT_OTHER): Payer: Medicare HMO

## 2017-03-04 VITALS — BP 139/88 | HR 74 | Temp 98.5°F | Resp 18 | Wt 256.1 lb

## 2017-03-04 DIAGNOSIS — C50911 Malignant neoplasm of unspecified site of right female breast: Secondary | ICD-10-CM

## 2017-03-04 DIAGNOSIS — Z171 Estrogen receptor negative status [ER-]: Principal | ICD-10-CM

## 2017-03-04 DIAGNOSIS — C773 Secondary and unspecified malignant neoplasm of axilla and upper limb lymph nodes: Secondary | ICD-10-CM

## 2017-03-04 DIAGNOSIS — C50919 Malignant neoplasm of unspecified site of unspecified female breast: Secondary | ICD-10-CM | POA: Insufficient documentation

## 2017-03-04 DIAGNOSIS — B20 Human immunodeficiency virus [HIV] disease: Secondary | ICD-10-CM | POA: Diagnosis not present

## 2017-03-04 DIAGNOSIS — C50411 Malignant neoplasm of upper-outer quadrant of right female breast: Secondary | ICD-10-CM

## 2017-03-04 DIAGNOSIS — Z5111 Encounter for antineoplastic chemotherapy: Secondary | ICD-10-CM

## 2017-03-04 LAB — COMPREHENSIVE METABOLIC PANEL
ALT: 23 U/L (ref 0–55)
ANION GAP: 7 meq/L (ref 3–11)
AST: 22 U/L (ref 5–34)
Albumin: 3.7 g/dL (ref 3.5–5.0)
Alkaline Phosphatase: 114 U/L (ref 40–150)
BILIRUBIN TOTAL: 0.28 mg/dL (ref 0.20–1.20)
BUN: 10.2 mg/dL (ref 7.0–26.0)
CALCIUM: 10.5 mg/dL — AB (ref 8.4–10.4)
CHLORIDE: 106 meq/L (ref 98–109)
CO2: 28 mEq/L (ref 22–29)
CREATININE: 1.3 mg/dL — AB (ref 0.6–1.1)
EGFR: 52 mL/min/{1.73_m2} — AB (ref >90)
Glucose: 90 mg/dl (ref 70–140)
Potassium: 4.2 mEq/L (ref 3.5–5.1)
Sodium: 141 mEq/L (ref 136–145)
TOTAL PROTEIN: 7.4 g/dL (ref 6.4–8.3)

## 2017-03-04 LAB — CBC WITH DIFFERENTIAL/PLATELET
BASO%: 0.1 % (ref 0.0–2.0)
BASOS ABS: 0 10*3/uL (ref 0.0–0.1)
EOS%: 0.7 % (ref 0.0–7.0)
Eosinophils Absolute: 0.1 10*3/uL (ref 0.0–0.5)
HEMATOCRIT: 28.2 % — AB (ref 34.8–46.6)
HEMOGLOBIN: 9.1 g/dL — AB (ref 11.6–15.9)
LYMPH#: 1.5 10*3/uL (ref 0.9–3.3)
LYMPH%: 22.1 % (ref 14.0–49.7)
MCH: 36.4 pg — AB (ref 25.1–34.0)
MCHC: 32.3 g/dL (ref 31.5–36.0)
MCV: 112.8 fL — ABNORMAL HIGH (ref 79.5–101.0)
MONO#: 0.7 10*3/uL (ref 0.1–0.9)
MONO%: 9.8 % (ref 0.0–14.0)
NEUT%: 67.3 % (ref 38.4–76.8)
NEUTROS ABS: 4.6 10*3/uL (ref 1.5–6.5)
Platelets: 152 10*3/uL (ref 145–400)
RBC: 2.5 10*6/uL — ABNORMAL LOW (ref 3.70–5.45)
RDW: 22.1 % — AB (ref 11.2–14.5)
WBC: 6.8 10*3/uL (ref 3.9–10.3)

## 2017-03-04 MED ORDER — PALONOSETRON HCL INJECTION 0.25 MG/5ML
0.2500 mg | Freq: Once | INTRAVENOUS | Status: AC
  Administered 2017-03-04: 0.25 mg via INTRAVENOUS

## 2017-03-04 MED ORDER — PALONOSETRON HCL INJECTION 0.25 MG/5ML
INTRAVENOUS | Status: AC
  Filled 2017-03-04: qty 5

## 2017-03-04 MED ORDER — SODIUM CHLORIDE 0.9 % IV SOLN
2400.0000 mg | Freq: Once | INTRAVENOUS | Status: AC
  Administered 2017-03-04: 2400 mg via INTRAVENOUS
  Filled 2017-03-04: qty 63.12

## 2017-03-04 MED ORDER — DEXAMETHASONE SODIUM PHOSPHATE 10 MG/ML IJ SOLN
INTRAMUSCULAR | Status: AC
  Filled 2017-03-04: qty 1

## 2017-03-04 MED ORDER — CARBOPLATIN CHEMO INJECTION 450 MG/45ML
177.1200 mg | Freq: Once | INTRAVENOUS | Status: AC
  Administered 2017-03-04: 180 mg via INTRAVENOUS
  Filled 2017-03-04: qty 18

## 2017-03-04 MED ORDER — SODIUM CHLORIDE 0.9 % IV SOLN
Freq: Once | INTRAVENOUS | Status: AC
  Administered 2017-03-04: 13:00:00 via INTRAVENOUS

## 2017-03-04 MED ORDER — SODIUM CHLORIDE 0.9% FLUSH
10.0000 mL | INTRAVENOUS | Status: DC | PRN
  Administered 2017-03-04: 10 mL
  Filled 2017-03-04: qty 10

## 2017-03-04 MED ORDER — HEPARIN SOD (PORK) LOCK FLUSH 100 UNIT/ML IV SOLN
500.0000 [IU] | Freq: Once | INTRAVENOUS | Status: AC | PRN
  Administered 2017-03-04: 500 [IU]
  Filled 2017-03-04: qty 5

## 2017-03-04 MED ORDER — DEXAMETHASONE SODIUM PHOSPHATE 10 MG/ML IJ SOLN
10.0000 mg | Freq: Once | INTRAMUSCULAR | Status: AC
  Administered 2017-03-04: 10 mg via INTRAVENOUS

## 2017-03-04 NOTE — Patient Instructions (Signed)
[facility] Discharge Instructions for Patients Receiving Chemotherapy  Today you received the following chemotherapy agents Gemzar and Carboplatin  To help prevent nausea and vomiting after your treatment, we encourage you to take your nausea medication as directed   If you develop nausea and vomiting that is not controlled by your nausea medication, call the clinic.   BELOW ARE SYMPTOMS THAT SHOULD BE REPORTED IMMEDIATELY:  *FEVER GREATER THAN 100.5 F  *CHILLS WITH OR WITHOUT FEVER  NAUSEA AND VOMITING THAT IS NOT CONTROLLED WITH YOUR NAUSEA MEDICATION  *UNUSUAL SHORTNESS OF BREATH  *UNUSUAL BRUISING OR BLEEDING  TENDERNESS IN MOUTH AND THROAT WITH OR WITHOUT PRESENCE OF ULCERS  *URINARY PROBLEMS  *BOWEL PROBLEMS  UNUSUAL RASH Items with * indicate a potential emergency and should be followed up as soon as possible.  Feel free to call the clinic you have any questions or concerns. The clinic phone number is [phone].  Please show the [place] at check-in to the Emergency Department and triage nurse.

## 2017-03-04 NOTE — Progress Notes (Signed)
[facility]  [phone] [phone]) [phone]     ID: Kayla Price DOB: [birth year]  MR#: [id number]  GHW#:[id number]  Patient Care Team: Lauree Chandler, NP as PCP - General (Nurse Practitioner) Tommy Medal, Lavell Islam, MD as PCP - Infectious Diseases (Infectious Diseases) Clent Jacks, MD as Consulting Physician (Ophthalmology) Renato Shin, MD as Consulting Physician (Endocrinology) Frederik Pear, MD as Consulting Physician (Orthopedic Surgery) Magrinat, Virgie Dad, MD as Consulting Physician (Oncology) Fanny Skates, MD as Consulting Physician (General Surgery) Donnamae Jude, MD as Consulting Physician (Obstetrics and Gynecology) Scot Dock, NP OTHER MD:  CHIEF COMPLAINT: Triple negative breast cancer, recurrent  CURRENT TREATMENT: Adjuvant chemotherapy  INTERVAL HISTORY: Kayla Price returns today for follow-up and treatment of her estrogen receptor negative breast cancer. Today is day 1 cycle 6 of 6 planned cycles of gemcitabine and carboplatin which she receives on days 1 and 8 of each 21 day cycle, with Neupogen on days 2 and 3 and 4 and Neulasta on day 9.  She is tolerating chemotherapy well.  She is having some bone aches in her feet that feel like they are coming from inside of the bone, but otherwise continues to tolerate chemotherapy well.    REVIEW OF SYSTEMS: Kayla Price denies fever, chills, nausea, vomiting, constipation, diarrhea, mucositis, or any other concerns.  A detailed ROS other than her feet aching is non contributory.    BREAST CANCER HISTORY: From the original intake note:  Kayla Price is a history of right-sided breast cancer dating back to 2005. She had a lumpectomy with sentinel lymph node sampling, chemotherapy, and radiation. I do not have access to those records at present.  More recently she had bilateral screening mammography at the [facility] 09/25/2016 showing a possible mass in the right breast. Diagnostic  mammography with ultrasonography on 09/28/2016 the patient underwent right diagnostic mammography with tomography and right breast ultrasonography. The breast density was category A. In the right breast at the 10:00 position there was an irregular mass measuring 2.5 cm. Ultrasound identified this the 10:00 radiant 10 cm from the nipple measuring 2.4 cm. In the right axilla there was an abnormal lymph node measuring 1.3 cm with other normal-appearing lymph nodes.  On 10/01/2016 she  underwent biopsy of the right breast mass in question as well as the suspicious axillary lymph node. 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 The proliferation marker was 70% in the breast lesion and 50% in the lymph node.  Her subsequent history is as detailed below.   PAST MEDICAL HISTORY: Past Medical History:  Diagnosis Date  . Alopecia areata 11/28/2009  . Bell's palsy   . BREAST CANCER, HX OF 2005  . Cancer [facility]) 2005   Breast cancer   chemotherapy and radiation  . CKD (chronic kidney disease) stage 3, GFR 30-59 ml/min 06/08/2015  . Dry eye syndrome   . Fasting hyperglycemia   . HIP FRACTURE, RIGHT 05/06/2008  . HIV (human immunodeficiency virus infection) ([place])   . HIV DISEASE 03/27/2006  . HYPERLIPIDEMIA, MIXED 12/15/2007  . HYPERTENSION 03/27/2006  . 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 MENORRHAGIA, POSTMENOPAUSAL 02/03/2009  . Osteoarthritis of left knee 06/08/2015  . OSTEOARTHROSIS, LOCAL, SCND, UNSPC SITE 04/07/2007  . PVD 04/07/2007  . Tinea capitis   . TRIGGER FINGER 05/06/2008  . Unspecified vitamin D deficiency 08/06/2007    PAST SURGICAL HISTORY: Past Surgical  History:  Procedure Laterality Date  . BREAST LUMPECTOMY Right    2005  . COLONOSCOPY    . HYSTEROSCOPY  2006  . KNEE ARTHROSCOPY Left 2002  . LYMPH NODE DISSECTION  2005  .  MASTECTOMY MODIFIED RADICAL Right 11/05/2016   Procedure: RIGHT MASTECTOMY MODIFIED RADICAL;  Surgeon: Fanny Skates, MD;  Location: [facility];  Service: General;  Laterality: Right;  . MODIFIED RADICAL MASTECTOMY Right 11/05/2016  . placement   of port-a-cath    . PORT-A-CATH REMOVAL  2006   insertion 2005  . PORTACATH PLACEMENT Right 11/05/2016   Procedure: INSERTION PORT-A-CATH WITH ULTRA SOUND;  Surgeon: Fanny Skates, MD;  Location: [facility];  Service: General;  Laterality: Right;  . removal of port a cath  12/2014  . THYROID SURGERY  2009   Ablation   . TOTAL KNEE ARTHROPLASTY Left 02/06/2016   Procedure: TOTAL KNEE ARTHROPLASTY;  Surgeon: Frederik Pear, MD;  Location: [facility];  Service: Orthopedics;  Laterality: Left;  . TUBAL LIGATION      FAMILY HISTORY Family History  Problem Relation Age of Onset  . Heart attack Brother        Massive MI in 38s  . Stroke Brother        CAD  . Kidney disease Mother   . Stroke Mother   . Diabetes Mother   . Liver disease Sister   . COPD Sister        had I-131 rx of hyperthyroidism  . Diabetes Sister   . Stroke Sister   . Cancer Paternal Uncle   . Cancer Cousin   . Heart failure Father   . Heart disease Father   . Arthritis Father   . Sarcoidosis Sister   The patient's father died at the age of 40 in the setting of Alzheimer's disease. The patient's mother died at the age of 4 from complications of diabetes. The patient has 3 brothers, 4 sisters. There is no history of breast or ovarian cancer in the family area   GYNECOLOGIC HISTORY:  Patient's last menstrual period was 11/06/2003.  menarche age 43, first live birth age 57, the patient is Kayla Price. She stopped having periods in 2005, with her chemotherapy; she never took hormone replacement   SOCIAL HISTORY:  Kayla Price worked for the [employer] more than 20 years. 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 At home she lives with her son  Kayla Price, her daughter Kayla Price who works for [employer] and her granddaughter Kayla Price, 39 y/o AS OF APRIL 2018     ADVANCED DIRECTIVES:  not in place    HEALTH MAINTENANCE: Social History  Substance Use Topics  . Smoking status: Never Smoker  . Smokeless tobacco: Never Used  . Alcohol use No     Colonoscopy:  PAP:  Bone density:   Allergies  Allergen Reactions  . Lisinopril Anaphylaxis and Swelling    Swelling of tongue and mouth 11/05/16- tolerates Olmesartan  . Pepcid [Famotidine] Other (See Comments)    PPI H2, BLOCKERS LOWER GASTRIC PH WHICH WOULD LEAD TO SUBTHERAPEUTIC RILPIVIRINE LEVELS AND POTENTIAL VIROLOGICAL FAILURE WITH RESISTANCE  . Prilosec [Omeprazole] Other (See Comments)    PPI H2, BLOCKERS LOWER GASTRIC PH WHICH WOULD LEAD TO SUBTHERAPEUTIC RILPIVIRINE LEVELS AND POTENTIAL VIROLOGICAL FAILURE WITH RESISTANCE  . Tums [Calcium Carbonate Antacid] Other (See Comments)    TUMS ANTACIDS CAN LOWER GASTRICWHICH COULD  LEAD TO SUBTHERAPEUTIC RILPIVIRINE LEVELS AND POTENTIAL VIROLOGICAL FAILURE WITH  RESISTANCE TUMS CAN BE GIVEN BUT NEED CONSULT WITH [facility] PHARMACY RE TIMING. I PREFER HER TO AVOID ALL TOGETHER    Current Outpatient Prescriptions  Medication Sig Dispense Refill  . Cholecalciferol (VITAMIN D3) 2000 units TABS Take 2,000 Units by mouth daily with lunch.    . co-enzyme Q-10 30 MG capsule Take 1 tablet by mouth daily.    . darunavir-cobicistat (PREZCOBIX) 800-150 MG tablet TAKE 1 TABLET BY MOUTH DAILY. SWALLOW WHOLE. DO NOT CRUSH, BREAK OR CHEW TABLETS. TAKE WITH FOOD. (Patient taking differently: Take 1 tablet by mouth every evening. TAKE 1 TABLET BY MOUTH DAILY. SWALLOW WHOLE. DO NOT CRUSH, BREAK OR CHEW TABLETS. TAKE WITH FOOD.) 30 tablet 11  . Dolutegravir-Rilpivirine (JULUCA) 50-25 MG TABS Take 1 tablet by mouth daily with breakfast. Take with the Prezcobix. 30 tablet 9  . gabapentin (NEURONTIN) 300 MG capsule Take 1 capsule (300 mg total) by mouth 4 (four) times  daily. 120 capsule 4  . HYDROcodone-acetaminophen (NORCO/VICODIN) 5-325 MG tablet Take 1 tablet by mouth every 6 (six) hours as needed for moderate pain. 120 tablet 0  . levothyroxine (SYNTHROID, LEVOTHROID) 150 MCG tablet TAKE 1 TABLET EVERY DAY BEFORE BREAKFAST 90 tablet 1  . lidocaine-prilocaine (EMLA) cream Apply to affected area once 30 g 3  . loratadine (CLARITIN) 10 MG tablet Take 10 mg by mouth daily as needed for itching. Reported on 06/08/2015    . LORazepam (ATIVAN) 0.5 MG tablet Take 1 tablet (0.5 mg total) by mouth at bedtime as needed (Nausea or vomiting). 30 tablet 0  . methocarbamol (ROBAXIN) 500 MG tablet Take 500 mg by mouth every 8 (eight) hours as needed for muscle spasms.    . Omega-3 Fatty Acids (FISH OIL PO) Take 1 capsule by mouth daily with lunch.    . Pitavastatin Calcium 4 MG TABS Take 1 tablet (4 mg total) by mouth daily. This may be placebo, study provided. Do not dispense. (Patient taking differently: Take 4 mg by mouth daily with lunch. This may be placebo, study provided. Do not dispense.) 30 tablet 11  . Probiotic Product (PROBIOTIC PO) Take 1 capsule by mouth daily with lunch.    . sulfamethoxazole-trimethoprim (BACTRIM DS,SEPTRA DS) 800-160 MG tablet Take 1 tablet by mouth daily. 30 tablet 5  . triamterene-hydrochlorothiazide (DYAZIDE) 37.5-25 MG capsule Take 1 each (1 capsule total) by mouth daily. 90 capsule 4   No current facility-administered medications for this visit.     OBJECTIVE:  Vitals:   03/04/17 1115  BP: 139/88  Pulse: 74  Resp: 18  Temp: 98.5 F (36.9 C)  SpO2: 100%     Body mass index is 40.11 kg/m.    Filed Weights   03/04/17 1115  Weight: 256 lb 1.6 oz (116.2 kg)     ECOG FS:1 - Symptomatic but completely ambulatory GENERAL: Patient is a well appearing female in no acute distress HEENT:  Sclerae anicteric.  Oropharynx clear and moist. No ulcerations or evidence of oropharyngeal candidiasis. Neck is supple.  NODES:  No cervical,  supraclavicular, or axillary lymphadenopathy palpated.  BREAST EXAM:  Deferred. LUNGS:  Clear to auscultation bilaterally.  No wheezes or rhonchi. HEART:  Regular rate and rhythm. No murmur appreciated. ABDOMEN:  Soft, nontender.  Positive, normoactive bowel sounds. No organomegaly palpated. MSK:  No focal spinal tenderness to palpation. Full range of motion bilaterally in the upper extremities. EXTREMITIES:  No peripheral edema.   SKIN:  Clear with no obvious rashes or skin changes. No nail dyscrasia.  NEURO:  Nonfocal. Well oriented.  Appropriate affect.    LAB RESULTS:  CMP     Component Value Date/Time   Price 140 02/18/2017 1132   K 4.6 02/18/2017 1132   CL 103 11/12/2016 0900   CO2 29 02/18/2017 1132   GLUCOSE 87 02/18/2017 1132   BUN 16.9 02/18/2017 1132   CREATININE 1.5 (H) 02/18/2017 1132   CALCIUM 10.7 (H) 02/18/2017 1132   PROT 7.4 02/18/2017 1132   ALBUMIN 3.5 02/18/2017 1132   AST 21 02/18/2017 1132   ALT 24 02/18/2017 1132   ALKPHOS 108 02/18/2017 1132   BILITOT 0.32 02/18/2017 1132   GFRNONAA 43 (L) 11/06/2016 0333   GFRNONAA 42 (L) 09/18/2016 0834   GFRAA 50 (L) 11/06/2016 0333   GFRAA 48 (L) 09/18/2016 0834    No results found for: TOTALPROTELP, ALBUMINELP, A1GS, A2GS, BETS, BETA2SER, GAMS, MSPIKE, SPEI  No results found for: Nils Pyle, KAPLAMBRATIO  Lab Results  Component Value Date   WBC 6.8 03/04/2017   NEUTROABS 4.6 03/04/2017   HGB 9.1 (L) 03/04/2017   HCT 28.2 (L) 03/04/2017   MCV 112.8 (H) 03/04/2017   PLT 152 03/04/2017      Chemistry      Component Value Date/Time   Price 140 02/18/2017 1132   K 4.6 02/18/2017 1132   CL 103 11/12/2016 0900   CO2 29 02/18/2017 1132   BUN 16.9 02/18/2017 1132   CREATININE 1.5 (H) 02/18/2017 1132   GLU 104 02/13/2016 1358      Component Value Date/Time   CALCIUM 10.7 (H) 02/18/2017 1132   ALKPHOS 108 02/18/2017 1132   AST 21 02/18/2017 1132   ALT 24 02/18/2017 1132   BILITOT 0.32  02/18/2017 1132       Lab Results  Component Value Date   LABCA2 11 04/20/2008    No components found for: [id number]  No results for input(s): INR in the last 168 hours.  Urinalysis    Component Value Date/Time   COLORURINE YELLOW 01/27/2016 1028   APPEARANCEUR CLEAR 01/27/2016 1028   LABSPEC 1.028 01/27/2016 1028   PHURINE 5.5 01/27/2016 1028   GLUCOSEU NEGATIVE 01/27/2016 1028   GLUCOSEU NEG mg/dL 07/23/2006 2113   HGBUR NEGATIVE 01/27/2016 1028   BILIRUBINUR NEGATIVE 01/27/2016 1028   KETONESUR NEGATIVE 01/27/2016 1028   PROTEINUR NEGATIVE 01/27/2016 1028   UROBILINOGEN 1 07/23/2006 2113   NITRITE NEGATIVE 01/27/2016 1028   LEUKOCYTESUR NEGATIVE 01/27/2016 1028     STUDIES: Vaginal Ultrasound studies reviewed with the patient  ELIGIBLE FOR AVAILABLE RESEARCH PROTOCOL: no  ASSESSMENT: 61 y.o. [place] woman   (1) status post right lumpectomy and sentinel lymph node sampling October 2005 for a 0.6 cm invasive ductal carcinoma involving one out of 2 sentinel lymph nodes sampled, grade 3, triple-negative, treated adjuvantly with doxorubicin and cyclophosphamide 4 followed by weekly paclitaxel 7, followed by adjuvant radiation  RECURRENT DISEASE: (2) status post right breast upper outer quadrant biopsy and right axillary lymph node biopsy 10/01/2016, both positive for a T2 N1, stage IIIB invasive ductal carcinoma, triple negative, with an MIB-1 of 50-70%  (3) status post right modified radical mastectomy 11/05/2016 showing a pT2 pN1, stage IIIB invasive ductal carcinoma, grade 3, triple negative, with negative margins  (4) Not a candidate for radiation given prior history  (5) adjuvant chemotherapy will consist of carboplatin and gemcitabine given days 1 and 8 of each 21 day cycle, for 6 cycles, starting 11/20/2016  (a) day 8 cycle 2 omitted because of  neutropenia; Neupogen/Neulasta added  (5) HIV positivity  PLAN: Shylynn is doing well today.  I reviewed her  labs with her which were stable.  She will proceed with chemotherapy today.  I encouarged her to try taking claritin daily for bony pain that she can experience with Neupogen and Neulasta.  She is going to try this.  We will see where she is with her pain next week.  She knows to call if it worsens.    The above plan was discussed with Delainy in detail.  She verbalized understanding and knows to call prior to her next appointment for any questions or concerns that she may have.    A total of (20) minutes of face-to-face time was spent with this patient with greater than 50% of that time in counseling and care-coordination.    Scot Dock, NP   03/04/2017 11:25 AM Medical Oncology and Hematology [facility] [address] [place], [place] [address] Tel. [phone]    Fax. [phone]

## 2017-03-05 ENCOUNTER — Ambulatory Visit: Payer: Medicare HMO

## 2017-03-05 LAB — T-HELPER CELLS (CD4) COUNT (NOT AT ARMC)
CD4 T CELL ABS: 610 /uL (ref 400–2700)
CD4 T CELL HELPER: 41 % (ref 33–55)

## 2017-03-06 ENCOUNTER — Telehealth: Payer: Self-pay | Admitting: Adult Health

## 2017-03-06 ENCOUNTER — Ambulatory Visit (INDEPENDENT_AMBULATORY_CARE_PROVIDER_SITE_OTHER): Payer: Medicare HMO | Admitting: Infectious Disease

## 2017-03-06 ENCOUNTER — Encounter: Payer: Self-pay | Admitting: Infectious Disease

## 2017-03-06 ENCOUNTER — Ambulatory Visit (HOSPITAL_BASED_OUTPATIENT_CLINIC_OR_DEPARTMENT_OTHER): Payer: Medicare HMO

## 2017-03-06 VITALS — BP 119/86 | HR 73 | Temp 97.8°F | Wt 254.0 lb

## 2017-03-06 VITALS — BP 143/81 | HR 81 | Temp 97.9°F | Resp 18

## 2017-03-06 DIAGNOSIS — B20 Human immunodeficiency virus [HIV] disease: Secondary | ICD-10-CM

## 2017-03-06 DIAGNOSIS — Z171 Estrogen receptor negative status [ER-]: Secondary | ICD-10-CM

## 2017-03-06 DIAGNOSIS — Z5189 Encounter for other specified aftercare: Secondary | ICD-10-CM

## 2017-03-06 DIAGNOSIS — C50411 Malignant neoplasm of upper-outer quadrant of right female breast: Secondary | ICD-10-CM | POA: Diagnosis not present

## 2017-03-06 DIAGNOSIS — I10 Essential (primary) hypertension: Secondary | ICD-10-CM | POA: Diagnosis not present

## 2017-03-06 DIAGNOSIS — G629 Polyneuropathy, unspecified: Secondary | ICD-10-CM

## 2017-03-06 DIAGNOSIS — C50911 Malignant neoplasm of unspecified site of right female breast: Secondary | ICD-10-CM

## 2017-03-06 DIAGNOSIS — C773 Secondary and unspecified malignant neoplasm of axilla and upper limb lymph nodes: Secondary | ICD-10-CM

## 2017-03-06 LAB — T-HELPER CELLS (CD4) COUNT (NOT AT ARMC)

## 2017-03-06 MED ORDER — TBO-FILGRASTIM 480 MCG/0.8ML ~~LOC~~ SOSY
480.0000 ug | PREFILLED_SYRINGE | Freq: Once | SUBCUTANEOUS | Status: AC
  Administered 2017-03-06: 480 ug via SUBCUTANEOUS
  Filled 2017-03-06: qty 0.8

## 2017-03-06 MED FILL — PREZCOBIX 800 MG-150 MG TAB: 800-150 | 30 days supply | Qty: 30 | Fill #7

## 2017-03-06 MED FILL — JULUCA 50-25 MG TAB: 50-25 | 30 days supply | Qty: 30 | Fill #3

## 2017-03-06 NOTE — Telephone Encounter (Signed)
Gave patient calendar of updated schedule.

## 2017-03-06 NOTE — Patient Instructions (Signed)
Tbo-Filgrastim injection What is this medicine? TBO-FILGRASTIM (T B O fil GRA stim) is a granulocyte colony-stimulating factor that stimulates the growth of neutrophils, a type of white blood cell important in the body's fight against infection. It is used to reduce the incidence of fever and infection in patients with certain types of cancer who are receiving chemotherapy that affects the bone marrow. This medicine may be used for other purposes; ask your health care provider or pharmacist if you have questions. COMMON BRAND NAME(S): Granix What should I tell my health care provider before I take this medicine? They need to know if you have any of these conditions: -bone scan or tests planned -kidney disease -sickle cell anemia -an unusual or allergic reaction to tbo-filgrastim, filgrastim, pegfilgrastim, other medicines, foods, dyes, or preservatives -pregnant or trying to get pregnant -breast-feeding How should I use this medicine? This medicine is for injection under the skin. If you get this medicine at home, you will be taught how to prepare and give this medicine. Refer to the Instructions for Use that come with your medication packaging. Use exactly as directed. Take your medicine at regular intervals. Do not take your medicine more often than directed. It is important that you put your used needles and syringes in a special sharps container. Do not put them in a trash can. If you do not have a sharps container, call your pharmacist or healthcare provider to get one. Talk to your pediatrician regarding the use of this medicine in children. Special care may be needed. Overdosage: If you think you have taken too much of this medicine contact a poison control center or emergency room at once. NOTE: This medicine is only for you. Do not share this medicine with others. What if I miss a dose? It is important not to miss your dose. Call your doctor or health care professional if you miss a  dose. What may interact with this medicine? This medicine may interact with the following medications: -medicines that may cause a release of neutrophils, such as lithium This list may not describe all possible interactions. Give your health care provider a list of all the medicines, herbs, non-prescription drugs, or dietary supplements you use. Also tell them if you smoke, drink alcohol, or use illegal drugs. Some items may interact with your medicine. What should I watch for while using this medicine? You may need blood work done while you are taking this medicine. What side effects may I notice from receiving this medicine? Side effects that you should report to your doctor or health care professional as soon as possible: -allergic reactions like skin rash, itching or hives, swelling of the face, lips, or tongue -blood in the urine -dark urine -dizziness -fast heartbeat -feeling faint -shortness of breath or breathing problems -signs and symptoms of infection like fever or chills; cough; or sore throat -signs and symptoms of kidney injury like trouble passing urine or change in the amount of urine -stomach or side pain, or pain at the shoulder -sweating -swelling of the legs, ankles, or abdomen -tiredness Side effects that usually do not require medical attention (report to your doctor or health care professional if they continue or are bothersome): -bone pain -headache -muscle pain -vomiting This list may not describe all possible side effects. Call your doctor for medical advice about side effects. You may report side effects to FDA at 1-800-FDA-1088. Where should I keep my medicine? Keep out of the reach of children. Store in a refrigerator between   2 and 8 degrees C (36 and 46 degrees F). Keep in carton to protect from light. Throw away this medicine if it is left out of the refrigerator for more than 5 consecutive days. Throw away any unused medicine after the expiration  date. NOTE: This sheet is a summary. It may not cover all possible information. If you have questions about this medicine, talk to your doctor, pharmacist, or health care provider.  2018 Elsevier/Gold Standard (2015-08-01 19:07:04)  

## 2017-03-06 NOTE — Progress Notes (Signed)
Chief complaint: followup for HIV, and breast cancer Subjective:    Patient ID: Kayla Price, female    DOB: [birth year], 61 y.o.   MRN: [id number]  HPI  Kayla Price is a 61 y.o. female who is doing superbly well on her  antiviral regimen, of JULUCA and Prezcobix  with undetectable viral load and health cd4 count.   She has been diagnosed with with recurrent  breast cancer and is sp ight breast upper outer quadrant biopsy and right axillary lymph node biopsy 10/01/2016, both positive for a T2 N1, stage IIIB invasive ductal carcinoma, triple negative, with an MIB-1 of 50-70%  She is now status post right modified radical mastectomy 11/05/2016 showing a pT2 pN1, stage IIIB invasive ductal carcinoma, grade 3, triple negative, with negative margins with LN sampling, portacath insertion.   She started gemcitabine and carboplatin in May and is beginning another cycle. Her CD4 dipped on therapy and she has been on bactrim.   She retains virological suppression   With re to her ARV hx [name] had reviewed prior gentoypes below:   At prior [facility] and I reviewed her  HIV and pulled in ALL of the R mutations noted in Dr Ruffin Frederick notes and we had neglected to mention a 41L which when added to all of her other nutation is essentially takes out all the and NRTI's and led to change from prior regimen of Isentress, Atripla to current one.  HIVdb: Genotypic Resistance Interpretation Algorithm  Date: 27-Oct-2014 17:15:11 UTC   Drug Resistance Interpretation: PR PI Major Resistance Mutations: None PI Minor Resistance Mutations: None Other Mutations: V77I Protease Inhibitors atazanavir/r (ATV/r) Susceptible darunavir/r (DRV/r) Susceptible fosamprenavir/r (FPV/r) Susceptible indinavir/r (IDV/r) Susceptible lopinavir/r (LPV/r) Susceptible nelfinavir (NFV) Susceptible saquinavir/r (SQV/r) Susceptible tipranavir/r (TPV/r) Susceptible PR Comments  Drug Resistance Interpretation: RT NRTI  Resistance Mutations: M41L, D67N, M184V, L210W, T215Y NNRTI Resistance Mutations: None Other Mutations: E44D, V118I Nucleoside RTI lamivudine (3TC) High-level resistance abacavir (ABC) High-level resistance zidovudine (AZT) High-level resistance stavudine (D4T) High-level resistance didanosine (DDI) High-level resistance emtricitabine (FTC) High-level resistance tenofovir (TDF) High-level resistance Non-Nucleoside RTI efavirenz (EFV) Susceptible etravirine (ETR) Susceptible nevirapine (NVP) Susceptible rilpivirine (RPV) Susceptible   She is in good spirits and with high energy. She is only sleeping 4 hours at night. Her neuropathy impoved with antihistamine and she is off of gabapentin.   Lab Results  Component Value Date   HIV1RNAQUANT 30 (H) 01/23/2017   HIV1RNAQUANT <20 NOT DETECTED 11/15/2016   HIV1RNAQUANT <20 05/28/2016     Lab Results  Component Value Date   CD4TABS 610 03/04/2017   CD4TABS See Separate Report 02/18/2017   CD4TABS 1,200 02/18/2017       Past Medical History:  Diagnosis Date  . Alopecia areata 11/28/2009  . Bell's palsy   . BREAST CANCER, HX OF 2005  . Cancer [facility]) 2005   Breast cancer   chemotherapy and radiation  . CKD (chronic kidney disease) stage 3, GFR 30-59 ml/min 06/08/2015  . Dry eye syndrome   . Fasting hyperglycemia   . HIP FRACTURE, RIGHT 05/06/2008  . HIV (human immunodeficiency virus infection) ([place])   . HIV DISEASE 03/27/2006  . HYPERLIPIDEMIA, MIXED 12/15/2007  . HYPERTENSION 03/27/2006  . 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 MENORRHAGIA, POSTMENOPAUSAL 02/03/2009  . Osteoarthritis of left knee 06/08/2015  . OSTEOARTHROSIS, LOCAL, SCND, UNSPC SITE 04/07/2007  . PVD 04/07/2007  . Tinea capitis   . TRIGGER FINGER 05/06/2008  . Unspecified vitamin D deficiency 08/06/2007    Past  Surgical History:  Procedure Laterality Date  . BREAST LUMPECTOMY Right    2005  . COLONOSCOPY    . HYSTEROSCOPY  2006  . KNEE  ARTHROSCOPY Left 2002  . LYMPH NODE DISSECTION  2005  . MASTECTOMY MODIFIED RADICAL Right 11/05/2016   Procedure: RIGHT MASTECTOMY MODIFIED RADICAL;  Surgeon: Fanny Skates, MD;  Location: [place];  Service: General;  Laterality: Right;  . MODIFIED RADICAL MASTECTOMY Right 11/05/2016  . placement   of port-a-cath    . PORT-A-CATH REMOVAL  2006   insertion 2005  . PORTACATH PLACEMENT Right 11/05/2016   Procedure: INSERTION PORT-A-CATH WITH ULTRA SOUND;  Surgeon: Fanny Skates, MD;  Location: [place];  Service: General;  Laterality: Right;  . removal of port a cath  12/2014  . THYROID SURGERY  2009   Ablation   . TOTAL KNEE ARTHROPLASTY Left 02/06/2016   Procedure: TOTAL KNEE ARTHROPLASTY;  Surgeon: Frederik Pear, MD;  Location: [place];  Service: Orthopedics;  Laterality: Left;  . TUBAL LIGATION      Family History  Problem Relation Age of Onset  . Heart attack Brother        Massive MI in 46s  . Stroke Brother        CAD  . Kidney disease Mother   . Stroke Mother   . Diabetes Mother   . Liver disease Sister   . COPD Sister        had I-131 rx of hyperthyroidism  . Diabetes Sister   . Stroke Sister   . Cancer Paternal Uncle   . Cancer Cousin   . Heart failure Father   . Heart disease Father   . Arthritis Father   . Sarcoidosis Sister       Social History   Social History  . Marital status: Widowed    Spouse name: N/A  . Number of children: N/A  . Years of education: N/A   Occupational History  . Postal Worker Unemployed   Social History Main Topics  . Smoking status: Never Smoker  . Smokeless tobacco: Never Used  . Alcohol use No  . Drug use: No  . Sexual activity: Not Currently    Birth control/ protection: Post-menopausal     Comment: declined condoms   Other Topics Concern  . Not on file   Social History Narrative   Single/widow   Diet: good   Do you drink/eat things with caffeine? Tea occasionally   Marital status: Widowed  What year were you married?  1993   Do you live in a house, apartment,assisted living, condo,trailer,ect.)? House   Is it one or more stories? Two   How many persons live in your home? 4   Do you have any pets in you home? No   Current or past profession: Tour manager   Do you exercise? Yes  Type&how often: Stationary Bike, Water Exercise 3-4 x week   Do you have a living will? Yes   Do you have a DNR form? No  If not do you what one?   Do you have signed POA/HPOA forms? No   If so, please bring to your appointment.                   Allergies  Allergen Reactions  . Lisinopril Anaphylaxis and Swelling    Swelling of tongue and mouth 11/05/16- tolerates Olmesartan  . Pepcid [Famotidine] Other (See Comments)    PPI H2, BLOCKERS LOWER GASTRIC PH WHICH WOULD LEAD TO  SUBTHERAPEUTIC RILPIVIRINE LEVELS AND POTENTIAL VIROLOGICAL FAILURE WITH RESISTANCE  . Prilosec [Omeprazole] Other (See Comments)    PPI H2, BLOCKERS LOWER GASTRIC PH WHICH WOULD LEAD TO SUBTHERAPEUTIC RILPIVIRINE LEVELS AND POTENTIAL VIROLOGICAL FAILURE WITH RESISTANCE  . Tums [Calcium Carbonate Antacid] Other (See Comments)    TUMS ANTACIDS CAN LOWER GASTRICWHICH COULD  LEAD TO SUBTHERAPEUTIC RILPIVIRINE LEVELS AND POTENTIAL VIROLOGICAL FAILURE WITH RESISTANCE TUMS CAN BE GIVEN BUT NEED CONSULT WITH ID PHARMACY RE TIMING. I PREFER HER TO AVOID ALL TOGETHER     Current Outpatient Prescriptions:  .  Cholecalciferol (VITAMIN D3) 2000 units TABS, Take 2,000 Units by mouth daily with lunch., Disp: , Rfl:  .  co-enzyme Q-10 30 MG capsule, Take 1 tablet by mouth daily., Disp: , Rfl:  .  darunavir-cobicistat (PREZCOBIX) 800-150 MG tablet, TAKE 1 TABLET BY MOUTH DAILY. SWALLOW WHOLE. DO NOT CRUSH, BREAK OR CHEW TABLETS. TAKE WITH FOOD. (Patient taking differently: Take 1 tablet by mouth every evening. TAKE 1 TABLET BY MOUTH DAILY. SWALLOW WHOLE. DO NOT CRUSH, BREAK OR CHEW TABLETS. TAKE WITH FOOD.), Disp: 30 tablet, Rfl: 11 .  Dolutegravir-Rilpivirine  (JULUCA) 50-25 MG TABS, Take 1 tablet by mouth daily with breakfast. Take with the Prezcobix., Disp: 30 tablet, Rfl: 9 .  gabapentin (NEURONTIN) 300 MG capsule, Take 1 capsule (300 mg total) by mouth 4 (four) times daily., Disp: 120 capsule, Rfl: 4 .  HYDROcodone-acetaminophen (NORCO/VICODIN) 5-325 MG tablet, Take 1 tablet by mouth every 6 (six) hours as needed for moderate pain., Disp: 120 tablet, Rfl: 0 .  levothyroxine (SYNTHROID, LEVOTHROID) 150 MCG tablet, TAKE 1 TABLET EVERY DAY BEFORE BREAKFAST, Disp: 90 tablet, Rfl: 1 .  lidocaine-prilocaine (EMLA) cream, Apply to affected area once, Disp: 30 g, Rfl: 3 .  loratadine (CLARITIN) 10 MG tablet, Take 10 mg by mouth daily as needed for itching. Reported on 06/08/2015, Disp: , Rfl:  .  LORazepam (ATIVAN) 0.5 MG tablet, Take 1 tablet (0.5 mg total) by mouth at bedtime as needed (Nausea or vomiting)., Disp: 30 tablet, Rfl: 0 .  methocarbamol (ROBAXIN) 500 MG tablet, Take 500 mg by mouth every 8 (eight) hours as needed for muscle spasms., Disp: , Rfl:  .  Omega-3 Fatty Acids (FISH OIL PO), Take 1 capsule by mouth daily with lunch., Disp: , Rfl:  .  Pitavastatin Calcium 4 MG TABS, Take 1 tablet (4 mg total) by mouth daily. This may be placebo, study provided. Do not dispense. (Patient taking differently: Take 4 mg by mouth daily with lunch. This may be placebo, study provided. Do not dispense.), Disp: 30 tablet, Rfl: 11 .  Probiotic Product (PROBIOTIC PO), Take 1 capsule by mouth daily with lunch., Disp: , Rfl:  .  sulfamethoxazole-trimethoprim (BACTRIM DS,SEPTRA DS) 800-160 MG tablet, Take 1 tablet by mouth daily., Disp: 30 tablet, Rfl: 5 .  triamterene-hydrochlorothiazide (DYAZIDE) 37.5-25 MG capsule, Take 1 each (1 capsule total) by mouth daily., Disp: 90 capsule, Rfl: 4   Review of Systems  Constitutional: Negative for activity change, appetite change, chills, diaphoresis, fatigue, fever and unexpected weight change.  HENT: Negative for congestion,  postnasal drip, rhinorrhea, sinus pressure, sneezing, sore throat and trouble swallowing.   Eyes: Negative for photophobia and visual disturbance.  Respiratory: Negative for cough, chest tightness, shortness of breath, wheezing and stridor.   Cardiovascular: Negative for chest pain, palpitations and leg swelling.  Gastrointestinal: Negative for abdominal distention, abdominal pain, anal bleeding, blood in stool, constipation, diarrhea, nausea and vomiting.  Genitourinary: Negative for difficulty urinating,  dysuria, flank pain and hematuria.  Musculoskeletal: Positive for myalgias. Negative for back pain, gait problem and joint swelling.  Skin: Negative for pallor and rash.  Neurological: Negative for dizziness, tremors, weakness and light-headedness.  Hematological: Negative for adenopathy. Does not bruise/bleed easily.  Psychiatric/Behavioral: Positive for sleep disturbance. Negative for agitation, behavioral problems, confusion, decreased concentration and dysphoric mood.       Objective:   Physical Exam  Constitutional: She is oriented to person, place, and time. She appears well-developed and well-nourished. No distress.  HENT:  Head: Normocephalic and atraumatic.  Mouth/Throat: Oropharynx is clear and moist. No oropharyngeal exudate.  Eyes: Pupils are equal, round, and reactive to light. Conjunctivae and EOM are normal. No scleral icterus.  Neck: Normal range of motion. Neck supple. No JVD present.  Cardiovascular: Normal rate and regular rhythm.   Pulmonary/Chest: Effort normal. No respiratory distress. She has no wheezes.  Abdominal: Soft. She exhibits no distension.  Lymphadenopathy:    She has no cervical adenopathy.  Neurological: She is alert and oriented to person, place, and time. She exhibits normal muscle tone. Coordination normal.  Skin: Skin is warm and dry. She is not diaphoretic. No erythema. No pallor.  Psychiatric: She has a normal mood and affect. Her behavior is  normal. Judgment and thought content normal.   Her mastectomy scar is well healed. Porta-cath site is clean       Assessment & Plan:   HIV DISEASE :  Continue Prezcobix and JULUCA WITH FOOD AN AVOIDING PPI, H2 blockers  I offered to add in Maraviroc at 150mg  po bid (due to interaction with COBI) more for effect vs her breast cancer. I doubt that she would have R5 tropic virus, more likely mixed tropic virus but CCR5 antagonism could be beneficial with re to her maligancy  She did not wish to complicate matters futher. Will investigate if there is something like [place] that could look for tropism--I doubt it   Recurrent breast cancer on chemotherapy:    Greatly appreciate Dr. Jana Hakim, [facility] taking care of [facility]  Abnormality of uterine wall with thickening: we will refer to Dr. Kennon Rounds (pt preference for workup of this abnormality)       HYPERTENSION : well controlled Vitals:   03/06/17 1105  BP: 119/86  Pulse: 73  Temp: 97.8 F (36.6 C)     Morbid obesity: to try to cut calories, carbohydrates  and to followup with Dr. Dewaine Oats  HYPOTHYROIDISM, POST-RADIATION  Followed by Dr. Loanne Drilling,.   Hyperlipidemia: Lipid Panel     Component Value Date/Time   CHOL 145 11/15/2016 1104   CHOL 191 10/15/2014 1007   TRIG 77 11/15/2016 1104   HDL 54 11/15/2016 1104   HDL 65 10/15/2014 1007   CHOLHDL 2.7 11/15/2016 1104   VLDL 15 11/15/2016 1104   LDLCALC 76 11/15/2016 1104   LDLCALC 99 10/15/2014 1007      CKD:  Lab Results  Component Value Date   CREATININE 1.3 (H) 03/04/2017   CREATININE 1.5 (H) 02/18/2017   CREATININE 1.6 (H) 02/11/2017     We spent greater than 40 minutes with the patient including greater than 50% of time in face to face counsel of the patient re her Breast cancer, her HIV,HTN, CKD, uterine abnormality and in coordination of her care.

## 2017-03-07 ENCOUNTER — Ambulatory Visit (INDEPENDENT_AMBULATORY_CARE_PROVIDER_SITE_OTHER): Payer: Medicare HMO | Admitting: Endocrinology

## 2017-03-07 ENCOUNTER — Encounter: Payer: Self-pay | Admitting: Endocrinology

## 2017-03-07 ENCOUNTER — Other Ambulatory Visit (INDEPENDENT_AMBULATORY_CARE_PROVIDER_SITE_OTHER): Payer: Medicare HMO

## 2017-03-07 ENCOUNTER — Ambulatory Visit (HOSPITAL_BASED_OUTPATIENT_CLINIC_OR_DEPARTMENT_OTHER): Payer: Medicare HMO

## 2017-03-07 VITALS — BP 136/78 | HR 74 | Wt 255.4 lb

## 2017-03-07 VITALS — BP 117/71 | Temp 98.5°F | Resp 18

## 2017-03-07 DIAGNOSIS — C50411 Malignant neoplasm of upper-outer quadrant of right female breast: Secondary | ICD-10-CM | POA: Diagnosis not present

## 2017-03-07 DIAGNOSIS — Z171 Estrogen receptor negative status [ER-]: Secondary | ICD-10-CM

## 2017-03-07 DIAGNOSIS — C773 Secondary and unspecified malignant neoplasm of axilla and upper limb lymph nodes: Secondary | ICD-10-CM

## 2017-03-07 DIAGNOSIS — C50911 Malignant neoplasm of unspecified site of right female breast: Secondary | ICD-10-CM

## 2017-03-07 DIAGNOSIS — E890 Postprocedural hypothyroidism: Secondary | ICD-10-CM

## 2017-03-07 LAB — TSH: TSH: 2.45 u[IU]/mL (ref 0.35–4.50)

## 2017-03-07 MED ORDER — LEVOTHYROXINE SODIUM 175 MCG PO TABS
175.0000 ug | ORAL_TABLET | Freq: Every day | ORAL | 3 refills | Status: DC
Start: 2017-03-07 — End: 2018-03-07

## 2017-03-07 MED ORDER — TBO-FILGRASTIM 480 MCG/0.8ML ~~LOC~~ SOSY
480.0000 ug | PREFILLED_SYRINGE | Freq: Once | SUBCUTANEOUS | Status: AC
  Administered 2017-03-07: 480 ug via SUBCUTANEOUS
  Filled 2017-03-07: qty 0.8

## 2017-03-07 NOTE — Progress Notes (Signed)
Subjective:    Patient ID: Kayla Price, female    DOB: [birth year], 61 y.o.   MRN: [id number]  HPI Pt returns for f/u of post-RAI hypothyroidism (she had RAI for hyperthyroidism due to Gerty in 2009, and now takes synthroid).  she says Synthroid is 175 mcg/d.  She says she never misses it.  pt states she feels well in general.   Past Medical History:  Diagnosis Date  . Alopecia areata 11/28/2009  . Bell's palsy   . BREAST CANCER, HX OF 2005  . Cancer [facility]) 2005   Breast cancer   chemotherapy and radiation  . CKD (chronic kidney disease) stage 3, GFR 30-59 ml/min 06/08/2015  . Dry eye syndrome   . Fasting hyperglycemia   . HIP FRACTURE, RIGHT 05/06/2008  . HIV (human immunodeficiency virus infection) ([place])   . HIV DISEASE 03/27/2006  . HYPERLIPIDEMIA, MIXED 12/15/2007  . HYPERTENSION 03/27/2006  . 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 MENORRHAGIA, POSTMENOPAUSAL 02/03/2009  . Osteoarthritis of left knee 06/08/2015  . OSTEOARTHROSIS, LOCAL, SCND, UNSPC SITE 04/07/2007  . PVD 04/07/2007  . Tinea capitis   . TRIGGER FINGER 05/06/2008  . Unspecified vitamin D deficiency 08/06/2007    Past Surgical History:  Procedure Laterality Date  . BREAST LUMPECTOMY Right    2005  . COLONOSCOPY    . HYSTEROSCOPY  2006  . KNEE ARTHROSCOPY Left 2002  . LYMPH NODE DISSECTION  2005  . MASTECTOMY MODIFIED RADICAL Right 11/05/2016   Procedure: RIGHT MASTECTOMY MODIFIED RADICAL;  Surgeon: Fanny Skates, MD;  Location: [place];  Service: General;  Laterality: Right;  . MODIFIED RADICAL MASTECTOMY Right 11/05/2016  . placement   of port-a-cath    . PORT-A-CATH REMOVAL  2006   insertion 2005  . PORTACATH PLACEMENT Right 11/05/2016   Procedure: INSERTION PORT-A-CATH WITH ULTRA SOUND;  Surgeon: Fanny Skates, MD;  Location: [place];  Service: General;  Laterality: Right;  . removal of port a cath  12/2014  . THYROID SURGERY  2009   Ablation   . TOTAL KNEE ARTHROPLASTY Left 02/06/2016   Procedure: TOTAL KNEE ARTHROPLASTY;  Surgeon: Frederik Pear, MD;  Location: [place];  Service: Orthopedics;  Laterality: Left;  . TUBAL LIGATION      Social History   Social History  . Marital status: Widowed    Spouse name: N/A  . Number of children: N/A  . Years of education: N/A   Occupational History  . Postal Worker Unemployed   Social History Main Topics  . Smoking status: Never Smoker  . Smokeless tobacco: Never Used  . Alcohol use No  . Drug use: No  . Sexual activity: Not Currently    Birth control/ protection: Post-menopausal     Comment: declined condoms   Other Topics Concern  . Not on file   Social History Narrative   Single/widow   Diet: good   Do you drink/eat things with caffeine? Tea occasionally   Marital status: Widowed  What year were you married? 1993   Do you live in a house, apartment,assisted living, condo,trailer,ect.)? House   Is it one or more stories? Two   How many persons live in your home? 4   Do you have any pets in you home? No   Current or past profession: Tour manager   Do you exercise? Yes  Type&how often: Stationary Bike, Water Exercise 3-4 x week   Do you have a living will? Yes   Do you have a DNR form?  No  If not do you what one?   Do you have signed POA/HPOA forms? No   If so, please bring to your appointment.                   Current Outpatient Prescriptions on File Prior to Visit  Medication Sig Dispense Refill  . Cholecalciferol (VITAMIN D3) 2000 units TABS Take 2,000 Units by mouth daily with lunch.    . co-enzyme Q-10 30 MG capsule Take 1 tablet by mouth daily.    . darunavir-cobicistat (PREZCOBIX) 800-150 MG tablet TAKE 1 TABLET BY MOUTH DAILY. SWALLOW WHOLE. DO NOT CRUSH, BREAK OR CHEW TABLETS. TAKE WITH FOOD. (Patient taking differently: Take 1 tablet by mouth every evening. TAKE 1 TABLET BY MOUTH DAILY. SWALLOW WHOLE. DO NOT CRUSH, BREAK OR CHEW TABLETS. TAKE WITH FOOD.) 30 tablet 11  . Dolutegravir-Rilpivirine  (JULUCA) 50-25 MG TABS Take 1 tablet by mouth daily with breakfast. Take with the Prezcobix. 30 tablet 9  . HYDROcodone-acetaminophen (NORCO/VICODIN) 5-325 MG tablet Take 1 tablet by mouth every 6 (six) hours as needed for moderate pain. 120 tablet 0  . lidocaine-prilocaine (EMLA) cream Apply to affected area once 30 g 3  . loratadine (CLARITIN) 10 MG tablet Take 10 mg by mouth daily. Reported on 06/08/2015    . methocarbamol (ROBAXIN) 500 MG tablet Take 500 mg by mouth every 8 (eight) hours as needed for muscle spasms.    . Omega-3 Fatty Acids (FISH OIL PO) Take 1 capsule by mouth daily with lunch.    . Pitavastatin Calcium 4 MG TABS Take 1 tablet (4 mg total) by mouth daily. This may be placebo, study provided. Do not dispense. (Patient taking differently: Take 4 mg by mouth daily with lunch. This may be placebo, study provided. Do not dispense.) 30 tablet 11  . Probiotic Product (PROBIOTIC PO) Take 1 capsule by mouth daily with lunch.    . sulfamethoxazole-trimethoprim (BACTRIM DS,SEPTRA DS) 800-160 MG tablet Take 1 tablet by mouth daily. 30 tablet 5  . triamterene-hydrochlorothiazide (DYAZIDE) 37.5-25 MG capsule Take 1 each (1 capsule total) by mouth daily. 90 capsule 4  . gabapentin (NEURONTIN) 300 MG capsule Take 1 capsule (300 mg total) by mouth 4 (four) times daily. (Patient not taking: Reported on 03/07/2017) 120 capsule 4  . LORazepam (ATIVAN) 0.5 MG tablet Take 1 tablet (0.5 mg total) by mouth at bedtime as needed (Nausea or vomiting). (Patient not taking: Reported on 03/07/2017) 30 tablet 0   No current facility-administered medications on file prior to visit.     Allergies  Allergen Reactions  . Lisinopril Anaphylaxis and Swelling    Swelling of tongue and mouth 11/05/16- tolerates Olmesartan  . Pepcid [Famotidine] Other (See Comments)    PPI H2, BLOCKERS LOWER GASTRIC PH WHICH WOULD LEAD TO SUBTHERAPEUTIC RILPIVIRINE LEVELS AND POTENTIAL VIROLOGICAL FAILURE WITH RESISTANCE  .  Prilosec [Omeprazole] Other (See Comments)    PPI H2, BLOCKERS LOWER GASTRIC PH WHICH WOULD LEAD TO SUBTHERAPEUTIC RILPIVIRINE LEVELS AND POTENTIAL VIROLOGICAL FAILURE WITH RESISTANCE  . Tums [Calcium Carbonate Antacid] Other (See Comments)    TUMS ANTACIDS CAN LOWER GASTRICWHICH COULD  LEAD TO SUBTHERAPEUTIC RILPIVIRINE LEVELS AND POTENTIAL VIROLOGICAL FAILURE WITH RESISTANCE TUMS CAN BE GIVEN BUT NEED CONSULT WITH ID PHARMACY RE TIMING. I PREFER HER TO AVOID ALL TOGETHER    Family History  Problem Relation Age of Onset  . Heart attack Brother        Massive MI in 65s  . Stroke Brother  CAD  . Kidney disease Mother   . Stroke Mother   . Diabetes Mother   . Liver disease Sister   . COPD Sister        had I-131 rx of hyperthyroidism  . Diabetes Sister   . Stroke Sister   . Cancer Paternal Uncle   . Cancer Cousin   . Heart failure Father   . Heart disease Father   . Arthritis Father   . Sarcoidosis Sister     BP 136/78   Pulse 74   Wt 255 lb 6.4 oz (115.8 kg)   LMP 11/06/2003   SpO2 98%   BMI 40.00 kg/m    Review of Systems Denies neck pain.     Objective:   Physical Exam VITAL SIGNS:  See vs page.   GENERAL: no distress.  eyes: no periorbital swelling, but there is bilat proptosis.   NECK: There is no palpable thyroid enlargement.  No thyroid nodule is palpable.  No palpable lymphadenopathy at the anterior neck.       Assessment & Plan:  Post-RAI hypothyroidism: due for recheck  Patient Instructions  A thyroid blood test is requested for you today.  We'll let you know about the results.   Based on the results, I'll refill the thyroid pill.  Please return in 1 year.

## 2017-03-07 NOTE — Patient Instructions (Addendum)
A thyroid blood test is requested for you today.  We'll let you know about the results.   Based on the results, I'll refill the thyroid pill.  Please return in 1 year.

## 2017-03-08 ENCOUNTER — Ambulatory Visit (HOSPITAL_BASED_OUTPATIENT_CLINIC_OR_DEPARTMENT_OTHER): Payer: Medicare HMO

## 2017-03-08 VITALS — BP 129/79 | HR 69 | Temp 97.8°F | Resp 20

## 2017-03-08 DIAGNOSIS — C50411 Malignant neoplasm of upper-outer quadrant of right female breast: Secondary | ICD-10-CM | POA: Diagnosis not present

## 2017-03-08 DIAGNOSIS — Z171 Estrogen receptor negative status [ER-]: Secondary | ICD-10-CM

## 2017-03-08 DIAGNOSIS — C50911 Malignant neoplasm of unspecified site of right female breast: Secondary | ICD-10-CM

## 2017-03-08 DIAGNOSIS — C773 Secondary and unspecified malignant neoplasm of axilla and upper limb lymph nodes: Secondary | ICD-10-CM | POA: Diagnosis not present

## 2017-03-08 MED ORDER — TBO-FILGRASTIM 480 MCG/0.8ML ~~LOC~~ SOSY
480.0000 ug | PREFILLED_SYRINGE | Freq: Once | SUBCUTANEOUS | Status: AC
  Administered 2017-03-08: 480 ug via SUBCUTANEOUS
  Filled 2017-03-08: qty 0.8

## 2017-03-08 NOTE — Patient Instructions (Signed)
Tbo-Filgrastim injection What is this medicine? TBO-FILGRASTIM (T B O fil GRA stim) is a granulocyte colony-stimulating factor that stimulates the growth of neutrophils, a type of white blood cell important in the body's fight against infection. It is used to reduce the incidence of fever and infection in patients with certain types of cancer who are receiving chemotherapy that affects the bone marrow. This medicine may be used for other purposes; ask your health care provider or pharmacist if you have questions. COMMON BRAND NAME(S): Granix What should I tell my health care provider before I take this medicine? They need to know if you have any of these conditions: -bone scan or tests planned -kidney disease -sickle cell anemia -an unusual or allergic reaction to tbo-filgrastim, filgrastim, pegfilgrastim, other medicines, foods, dyes, or preservatives -pregnant or trying to get pregnant -breast-feeding How should I use this medicine? This medicine is for injection under the skin. If you get this medicine at home, you will be taught how to prepare and give this medicine. Refer to the Instructions for Use that come with your medication packaging. Use exactly as directed. Take your medicine at regular intervals. Do not take your medicine more often than directed. It is important that you put your used needles and syringes in a special sharps container. Do not put them in a trash can. If you do not have a sharps container, call your pharmacist or healthcare provider to get one. Talk to your pediatrician regarding the use of this medicine in children. Special care may be needed. Overdosage: If you think you have taken too much of this medicine contact a poison control center or emergency room at once. NOTE: This medicine is only for you. Do not share this medicine with others. What if I miss a dose? It is important not to miss your dose. Call your doctor or health care professional if you miss a  dose. What may interact with this medicine? This medicine may interact with the following medications: -medicines that may cause a release of neutrophils, such as lithium This list may not describe all possible interactions. Give your health care provider a list of all the medicines, herbs, non-prescription drugs, or dietary supplements you use. Also tell them if you smoke, drink alcohol, or use illegal drugs. Some items may interact with your medicine. What should I watch for while using this medicine? You may need blood work done while you are taking this medicine. What side effects may I notice from receiving this medicine? Side effects that you should report to your doctor or health care professional as soon as possible: -allergic reactions like skin rash, itching or hives, swelling of the face, lips, or tongue -blood in the urine -dark urine -dizziness -fast heartbeat -feeling faint -shortness of breath or breathing problems -signs and symptoms of infection like fever or chills; cough; or sore throat -signs and symptoms of kidney injury like trouble passing urine or change in the amount of urine -stomach or side pain, or pain at the shoulder -sweating -swelling of the legs, ankles, or abdomen -tiredness Side effects that usually do not require medical attention (report to your doctor or health care professional if they continue or are bothersome): -bone pain -headache -muscle pain -vomiting This list may not describe all possible side effects. Call your doctor for medical advice about side effects. You may report side effects to FDA at 1-800-FDA-1088. Where should I keep my medicine? Keep out of the reach of children. Store in a refrigerator between   2 and 8 degrees C (36 and 46 degrees F). Keep in carton to protect from light. Throw away this medicine if it is left out of the refrigerator for more than 5 consecutive days. Throw away any unused medicine after the expiration  date. NOTE: This sheet is a summary. It may not cover all possible information. If you have questions about this medicine, talk to your doctor, pharmacist, or health care provider.  2018 Elsevier/Gold Standard (2015-08-01 19:07:04)  

## 2017-03-11 ENCOUNTER — Ambulatory Visit (HOSPITAL_BASED_OUTPATIENT_CLINIC_OR_DEPARTMENT_OTHER): Payer: Medicare HMO

## 2017-03-11 ENCOUNTER — Ambulatory Visit (HOSPITAL_BASED_OUTPATIENT_CLINIC_OR_DEPARTMENT_OTHER): Payer: Medicare HMO | Admitting: Adult Health

## 2017-03-11 ENCOUNTER — Encounter: Payer: Self-pay | Admitting: Adult Health

## 2017-03-11 ENCOUNTER — Other Ambulatory Visit (HOSPITAL_BASED_OUTPATIENT_CLINIC_OR_DEPARTMENT_OTHER): Payer: Medicare HMO

## 2017-03-11 ENCOUNTER — Telehealth: Payer: Self-pay | Admitting: Oncology

## 2017-03-11 VITALS — BP 131/77 | HR 64 | Temp 97.8°F | Resp 19 | Ht 67.0 in | Wt 257.0 lb

## 2017-03-11 DIAGNOSIS — C773 Secondary and unspecified malignant neoplasm of axilla and upper limb lymph nodes: Secondary | ICD-10-CM | POA: Diagnosis not present

## 2017-03-11 DIAGNOSIS — C50411 Malignant neoplasm of upper-outer quadrant of right female breast: Secondary | ICD-10-CM

## 2017-03-11 DIAGNOSIS — C50911 Malignant neoplasm of unspecified site of right female breast: Secondary | ICD-10-CM

## 2017-03-11 DIAGNOSIS — Z171 Estrogen receptor negative status [ER-]: Secondary | ICD-10-CM

## 2017-03-11 DIAGNOSIS — B20 Human immunodeficiency virus [HIV] disease: Secondary | ICD-10-CM

## 2017-03-11 DIAGNOSIS — Z5111 Encounter for antineoplastic chemotherapy: Secondary | ICD-10-CM | POA: Diagnosis not present

## 2017-03-11 LAB — CBC WITH DIFFERENTIAL/PLATELET
BASO%: 0.6 % (ref 0.0–2.0)
BASOS ABS: 0 10*3/uL (ref 0.0–0.1)
EOS%: 0.7 % (ref 0.0–7.0)
Eosinophils Absolute: 0 10*3/uL (ref 0.0–0.5)
HCT: 25.4 % — ABNORMAL LOW (ref 34.8–46.6)
HGB: 8.5 g/dL — ABNORMAL LOW (ref 11.6–15.9)
LYMPH%: 32.8 % (ref 14.0–49.7)
MCH: 38.1 pg — AB (ref 25.1–34.0)
MCHC: 33.5 g/dL (ref 31.5–36.0)
MCV: 113.7 fL — ABNORMAL HIGH (ref 79.5–101.0)
MONO#: 1 10*3/uL — AB (ref 0.1–0.9)
MONO%: 16.8 % — ABNORMAL HIGH (ref 0.0–14.0)
NEUT#: 3 10*3/uL (ref 1.5–6.5)
NEUT%: 49.1 % (ref 38.4–76.8)
PLATELETS: 171 10*3/uL (ref 145–400)
RBC: 2.23 10*6/uL — ABNORMAL LOW (ref 3.70–5.45)
RDW: 20.1 % — ABNORMAL HIGH (ref 11.2–14.5)
WBC: 6.1 10*3/uL (ref 3.9–10.3)
lymph#: 2 10*3/uL (ref 0.9–3.3)

## 2017-03-11 LAB — COMPREHENSIVE METABOLIC PANEL
ALT: 15 U/L (ref 0–55)
ANION GAP: 6 meq/L (ref 3–11)
AST: 14 U/L (ref 5–34)
Albumin: 3.4 g/dL — ABNORMAL LOW (ref 3.5–5.0)
Alkaline Phosphatase: 123 U/L (ref 40–150)
BUN: 17.2 mg/dL (ref 7.0–26.0)
CHLORIDE: 105 meq/L (ref 98–109)
CO2: 28 meq/L (ref 22–29)
CREATININE: 1.4 mg/dL — AB (ref 0.6–1.1)
Calcium: 10 mg/dL (ref 8.4–10.4)
EGFR: 48 mL/min/{1.73_m2} — AB (ref >90)
Glucose: 79 mg/dl (ref 70–140)
POTASSIUM: 4.4 meq/L (ref 3.5–5.1)
Sodium: 140 mEq/L (ref 136–145)
Total Bilirubin: <0.22 mg/dL (ref 0.20–1.20)
Total Protein: 6.9 g/dL (ref 6.4–8.3)

## 2017-03-11 MED ORDER — SODIUM CHLORIDE 0.9 % IV SOLN
Freq: Once | INTRAVENOUS | Status: AC
  Administered 2017-03-11: 13:00:00 via INTRAVENOUS

## 2017-03-11 MED ORDER — PALONOSETRON HCL INJECTION 0.25 MG/5ML
0.2500 mg | Freq: Once | INTRAVENOUS | Status: AC
  Administered 2017-03-11: 0.25 mg via INTRAVENOUS

## 2017-03-11 MED ORDER — PEGFILGRASTIM 6 MG/0.6ML ~~LOC~~ PSKT
6.0000 mg | PREFILLED_SYRINGE | Freq: Once | SUBCUTANEOUS | Status: AC
  Administered 2017-03-11: 6 mg via SUBCUTANEOUS
  Filled 2017-03-11: qty 0.6

## 2017-03-11 MED ORDER — PALONOSETRON HCL INJECTION 0.25 MG/5ML
INTRAVENOUS | Status: AC
  Filled 2017-03-11: qty 5

## 2017-03-11 MED ORDER — DEXAMETHASONE SODIUM PHOSPHATE 10 MG/ML IJ SOLN
10.0000 mg | Freq: Once | INTRAMUSCULAR | Status: AC
  Administered 2017-03-11: 10 mg via INTRAVENOUS

## 2017-03-11 MED ORDER — SODIUM CHLORIDE 0.9 % IV SOLN
2400.0000 mg | Freq: Once | INTRAVENOUS | Status: AC
  Administered 2017-03-11: 2400 mg via INTRAVENOUS
  Filled 2017-03-11: qty 63.12

## 2017-03-11 MED ORDER — HEPARIN SOD (PORK) LOCK FLUSH 100 UNIT/ML IV SOLN
500.0000 [IU] | Freq: Once | INTRAVENOUS | Status: AC | PRN
  Administered 2017-03-11: 500 [IU]
  Filled 2017-03-11: qty 5

## 2017-03-11 MED ORDER — SODIUM CHLORIDE 0.9 % IV SOLN
167.3600 mg | Freq: Once | INTRAVENOUS | Status: AC
  Administered 2017-03-11: 170 mg via INTRAVENOUS
  Filled 2017-03-11: qty 17

## 2017-03-11 MED ORDER — DEXAMETHASONE SODIUM PHOSPHATE 10 MG/ML IJ SOLN
INTRAMUSCULAR | Status: AC
  Filled 2017-03-11: qty 1

## 2017-03-11 MED ORDER — SODIUM CHLORIDE 0.9% FLUSH
10.0000 mL | INTRAVENOUS | Status: DC | PRN
  Administered 2017-03-11: 10 mL
  Filled 2017-03-11: qty 10

## 2017-03-11 NOTE — Telephone Encounter (Signed)
Gave patient avs and calendar with appts.  °

## 2017-03-11 NOTE — Patient Instructions (Signed)
[facility] Discharge Instructions for Patients Receiving Chemotherapy  Today you received the following chemotherapy agents: Gemzar and Carboplatin  To help prevent nausea and vomiting after your treatment, we encourage you to take your nausea medication as directed.    If you develop nausea and vomiting that is not controlled by your nausea medication, call the clinic.   BELOW ARE SYMPTOMS THAT SHOULD BE REPORTED IMMEDIATELY:  *FEVER GREATER THAN 100.5 F  *CHILLS WITH OR WITHOUT FEVER  NAUSEA AND VOMITING THAT IS NOT CONTROLLED WITH YOUR NAUSEA MEDICATION  *UNUSUAL SHORTNESS OF BREATH  *UNUSUAL BRUISING OR BLEEDING  TENDERNESS IN MOUTH AND THROAT WITH OR WITHOUT PRESENCE OF ULCERS  *URINARY PROBLEMS  *BOWEL PROBLEMS  UNUSUAL RASH Items with * indicate a potential emergency and should be followed up as soon as possible.  Feel free to call the clinic you have any questions or concerns. The clinic phone number is [phone].  Please show the [facility] at check-in to the Emergency Department and triage nurse.

## 2017-03-11 NOTE — Progress Notes (Signed)
[facility]  [phone] Fax:([phone]     ID: Kayla Price DOB: 1956/05/11  MR#: [id number]  QPY#:[id number]  Patient Care Team: Lauree Chandler, NP as PCP - General (Nurse Practitioner) Tommy Medal, Lavell Islam, MD as PCP - Infectious Diseases (Infectious Diseases) Clent Jacks, MD as Consulting Physician (Ophthalmology) Renato Shin, MD as Consulting Physician (Endocrinology) Frederik Pear, MD as Consulting Physician (Orthopedic Surgery) Magrinat, Virgie Dad, MD as Consulting Physician (Oncology) Fanny Skates, MD as Consulting Physician (General Surgery) Donnamae Jude, MD as Consulting Physician (Obstetrics and Gynecology) Scot Dock, NP OTHER MD:  CHIEF COMPLAINT: Triple negative breast cancer, recurrent  CURRENT TREATMENT: Adjuvant chemotherapy  INTERVAL HISTORY: Kayla Price returns today for follow-up and treatment of her estrogen receptor negative breast cancer. Today is day 8 cycle 6 of 6 planned cycles of gemcitabine and carboplatin which she receives on days 1 and 8 of each 21 day cycle, with Neupogen on days 2 and 3 and 4 and Neulasta on day 9.    REVIEW OF SYSTEMS:  Kayla Price is doing very well today.  She is without any questions or concerns.  She is happy to be finished with chemotherapy.  A detailed ROS is non contributory.    BREAST CANCER HISTORY: From the original intake note:  Kayla Price is a history of right-sided breast cancer dating back to 2005. She had a lumpectomy with sentinel lymph node sampling, chemotherapy, and radiation. I do not have access to those records at present.  More recently she had bilateral screening mammography at the [facility] 09/25/2016 showing a possible mass in the right breast. Diagnostic mammography with ultrasonography on 09/28/2016 the patient underwent right diagnostic mammography with tomography and right breast ultrasonography. The breast density was category A. In the right breast at  the 10:00 position there was an irregular mass measuring 2.5 cm. Ultrasound identified this the 10:00 radiant 10 cm from the nipple measuring 2.4 cm. In the right axilla there was an abnormal lymph node measuring 1.3 cm with other normal-appearing lymph nodes.  On 10/01/2016 she  underwent biopsy of the right breast mass in question as well as the suspicious axillary lymph node. 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 The proliferation marker was 70% in the breast lesion and 50% in the lymph node.  Her subsequent history is as detailed below.   PAST MEDICAL HISTORY: Past Medical History:  Diagnosis Date  . Alopecia areata 11/28/2009  . Bell's palsy   . BREAST CANCER, HX OF 2005  . Cancer [facility]) 2005   Breast cancer   chemotherapy and radiation  . CKD (chronic kidney disease) stage 3, GFR 30-59 ml/min 06/08/2015  . Dry eye syndrome   . Fasting hyperglycemia   . HIP FRACTURE, RIGHT 05/06/2008  . HIV (human immunodeficiency virus infection) ([place])   . HIV DISEASE 03/27/2006  . HYPERLIPIDEMIA, MIXED 12/15/2007  . HYPERTENSION 03/27/2006  . 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 MENORRHAGIA, POSTMENOPAUSAL 02/03/2009  . Osteoarthritis of left knee 06/08/2015  . OSTEOARTHROSIS, LOCAL, SCND, UNSPC SITE 04/07/2007  . PVD 04/07/2007  . Tinea capitis   . TRIGGER FINGER 05/06/2008  . Unspecified vitamin D deficiency 08/06/2007    PAST SURGICAL HISTORY: Past Surgical History:  Procedure Laterality Date  . BREAST LUMPECTOMY Right    2005  . COLONOSCOPY    . HYSTEROSCOPY  2006  . KNEE ARTHROSCOPY  Left 2002  . LYMPH NODE DISSECTION  2005  . MASTECTOMY MODIFIED RADICAL Right 11/05/2016   Procedure: RIGHT MASTECTOMY MODIFIED RADICAL;  Surgeon: Fanny Skates, MD;  Location: [facility];  Service: General;  Laterality: Right;  . MODIFIED RADICAL  MASTECTOMY Right 11/05/2016  . placement   of port-a-cath    . PORT-A-CATH REMOVAL  2006   insertion 2005  . PORTACATH PLACEMENT Right 11/05/2016   Procedure: INSERTION PORT-A-CATH WITH ULTRA SOUND;  Surgeon: Fanny Skates, MD;  Location: [facility];  Service: General;  Laterality: Right;  . removal of port a cath  12/2014  . THYROID SURGERY  2009   Ablation   . TOTAL KNEE ARTHROPLASTY Left 02/06/2016   Procedure: TOTAL KNEE ARTHROPLASTY;  Surgeon: Frederik Pear, MD;  Location: [facility];  Service: Orthopedics;  Laterality: Left;  . TUBAL LIGATION      FAMILY HISTORY Family History  Problem Relation Age of Onset  . Heart attack Brother        Massive MI in 57s  . Stroke Brother        CAD  . Kidney disease Mother   . Stroke Mother   . Diabetes Mother   . Liver disease Sister   . COPD Sister        had I-131 rx of hyperthyroidism  . Diabetes Sister   . Stroke Sister   . Cancer Paternal Uncle   . Cancer Cousin   . Heart failure Father   . Heart disease Father   . Arthritis Father   . Sarcoidosis Sister   The patient's father died at the age of 25 in the setting of Alzheimer's disease. The patient's mother died at the age of 55 from complications of diabetes. The patient has 3 brothers, 4 sisters. There is no history of breast or ovarian cancer in the family area   GYNECOLOGIC HISTORY:  Patient's last menstrual period was 11/06/2003.  menarche age 31, first live birth age 45, the patient is [place] P2. She stopped having periods in 2005, with her chemotherapy; she never took hormone replacement   SOCIAL HISTORY:  [name] worked for the [employer] more than 20 years. 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 At home she lives with her son Kayla Price, her daughter Kayla Price who works for [employer] and her granddaughter Kayla Price, 61 y/o AS OF APRIL 2018     ADVANCED DIRECTIVES:  not in place    HEALTH MAINTENANCE: Social History  Substance  Use Topics  . Smoking status: Never Smoker  . Smokeless tobacco: Never Used  . Alcohol use No     Colonoscopy:  PAP:  Bone density:   Allergies  Allergen Reactions  . Lisinopril Anaphylaxis and Swelling    Swelling of tongue and mouth 11/05/16- tolerates Olmesartan  . Pepcid [Famotidine] Other (See Comments)    PPI H2, BLOCKERS LOWER GASTRIC PH WHICH WOULD LEAD TO SUBTHERAPEUTIC RILPIVIRINE LEVELS AND POTENTIAL VIROLOGICAL FAILURE WITH RESISTANCE  . Prilosec [Omeprazole] Other (See Comments)    PPI H2, BLOCKERS LOWER GASTRIC PH WHICH WOULD LEAD TO SUBTHERAPEUTIC RILPIVIRINE LEVELS AND POTENTIAL VIROLOGICAL FAILURE WITH RESISTANCE  . Tums [Calcium Carbonate Antacid] Other (See Comments)    TUMS ANTACIDS CAN LOWER GASTRICWHICH COULD  LEAD TO SUBTHERAPEUTIC RILPIVIRINE LEVELS AND POTENTIAL VIROLOGICAL FAILURE WITH RESISTANCE TUMS CAN BE GIVEN BUT NEED CONSULT WITH [facility] PHARMACY RE TIMING. I PREFER HER TO AVOID ALL TOGETHER    Current Outpatient Prescriptions  Medication  Sig Dispense Refill  . Cholecalciferol (VITAMIN D3) 2000 units TABS Take 2,000 Units by mouth daily with lunch.    . co-enzyme Q-10 30 MG capsule Take 1 tablet by mouth daily.    . darunavir-cobicistat (PREZCOBIX) 800-150 MG tablet TAKE 1 TABLET BY MOUTH DAILY. SWALLOW WHOLE. DO NOT CRUSH, BREAK OR CHEW TABLETS. TAKE WITH FOOD. (Patient taking differently: Take 1 tablet by mouth every evening. TAKE 1 TABLET BY MOUTH DAILY. SWALLOW WHOLE. DO NOT CRUSH, BREAK OR CHEW TABLETS. TAKE WITH FOOD.) 30 tablet 11  . Dolutegravir-Rilpivirine (JULUCA) 50-25 MG TABS Take 1 tablet by mouth daily with breakfast. Take with the Prezcobix. 30 tablet 9  . gabapentin (NEURONTIN) 300 MG capsule Take 1 capsule (300 mg total) by mouth 4 (four) times daily. 120 capsule 4  . HYDROcodone-acetaminophen (NORCO/VICODIN) 5-325 MG tablet Take 1 tablet by mouth every 6 (six) hours as needed for moderate pain. 120 tablet 0  . levothyroxine (SYNTHROID,  LEVOTHROID) 175 MCG tablet Take 1 tablet (175 mcg total) by mouth daily before breakfast. 90 tablet 3  . lidocaine-prilocaine (EMLA) cream Apply to affected area once 30 g 3  . loratadine (CLARITIN) 10 MG tablet Take 10 mg by mouth daily. Reported on 06/08/2015    . LORazepam (ATIVAN) 0.5 MG tablet Take 1 tablet (0.5 mg total) by mouth at bedtime as needed (Nausea or vomiting). 30 tablet 0  . methocarbamol (ROBAXIN) 500 MG tablet Take 500 mg by mouth every 8 (eight) hours as needed for muscle spasms.    . Omega-3 Fatty Acids (FISH OIL PO) Take 1 capsule by mouth daily with lunch.    . Pitavastatin Calcium 4 MG TABS Take 1 tablet (4 mg total) by mouth daily. This may be placebo, study provided. Do not dispense. (Patient taking differently: Take 4 mg by mouth daily with lunch. This may be placebo, study provided. Do not dispense.) 30 tablet 11  . Probiotic Product (PROBIOTIC PO) Take 1 capsule by mouth daily with lunch.    . sulfamethoxazole-trimethoprim (BACTRIM DS,SEPTRA DS) 800-160 MG tablet Take 1 tablet by mouth daily. 30 tablet 5  . triamterene-hydrochlorothiazide (DYAZIDE) 37.5-25 MG capsule Take 1 each (1 capsule total) by mouth daily. 90 capsule 4   No current facility-administered medications for this visit.     OBJECTIVE:  Vitals:   03/11/17 1143  BP: 131/77  Pulse: 64  Resp: 19  Temp: 97.8 F (36.6 C)  SpO2: 100%     Body mass index is 40.25 kg/m.    Filed Weights   03/11/17 1143  Weight: 257 lb (116.6 kg)     ECOG FS:1 - Symptomatic but completely ambulatory GENERAL: Patient is a well appearing female in no acute distress HEENT:  Sclerae anicteric.  Oropharynx clear and moist. No ulcerations or evidence of oropharyngeal candidiasis. Neck is supple.  NODES:  No cervical, supraclavicular, or axillary lymphadenopathy palpated.  BREAST EXAM:  Deferred. LUNGS:  Clear to auscultation bilaterally.  No wheezes or rhonchi. HEART:  Regular rate and rhythm. No murmur  appreciated. ABDOMEN:  Soft, nontender.  Positive, normoactive bowel sounds. No organomegaly palpated. MSK:  No focal spinal tenderness to palpation. Full range of motion bilaterally in the upper extremities. EXTREMITIES:  No peripheral edema.   SKIN:  Clear with no obvious rashes or skin changes. No nail dyscrasia. NEURO:  Nonfocal. Well oriented.  Appropriate affect.    LAB RESULTS:  CMP     Component Value Date/Time   Price 141 03/04/2017 1053  K 4.2 03/04/2017 1053   CL 103 11/12/2016 0900   CO2 28 03/04/2017 1053   GLUCOSE 90 03/04/2017 1053   BUN 10.2 03/04/2017 1053   CREATININE 1.3 (H) 03/04/2017 1053   CALCIUM 10.5 (H) 03/04/2017 1053   PROT 7.4 03/04/2017 1053   ALBUMIN 3.7 03/04/2017 1053   AST 22 03/04/2017 1053   ALT 23 03/04/2017 1053   ALKPHOS 114 03/04/2017 1053   BILITOT 0.28 03/04/2017 1053   GFRNONAA 43 (L) 11/06/2016 0333   GFRNONAA 42 (L) 09/18/2016 0834   GFRAA 50 (L) 11/06/2016 0333   GFRAA 48 (L) 09/18/2016 0834    No results found for: TOTALPROTELP, ALBUMINELP, A1GS, A2GS, BETS, BETA2SER, GAMS, MSPIKE, SPEI  No results found for: Nils Pyle, [facility]  Lab Results  Component Value Date   WBC 6.1 03/11/2017   NEUTROABS 3.0 03/11/2017   HGB 8.5 (L) 03/11/2017   HCT 25.4 (L) 03/11/2017   MCV 113.7 (H) 03/11/2017   PLT 171 03/11/2017      Chemistry      Component Value Date/Time   Price 141 03/04/2017 1053   K 4.2 03/04/2017 1053   CL 103 11/12/2016 0900   CO2 28 03/04/2017 1053   BUN 10.2 03/04/2017 1053   CREATININE 1.3 (H) 03/04/2017 1053   GLU 104 02/13/2016 1358      Component Value Date/Time   CALCIUM 10.5 (H) 03/04/2017 1053   ALKPHOS 114 03/04/2017 1053   AST 22 03/04/2017 1053   ALT 23 03/04/2017 1053   BILITOT 0.28 03/04/2017 1053       Lab Results  Component Value Date   LABCA2 11 04/20/2008    No components found for: [id number]  No results for input(s): INR in the last 168 hours.  Urinalysis     Component Value Date/Time   COLORURINE YELLOW 01/27/2016 1028   APPEARANCEUR CLEAR 01/27/2016 1028   LABSPEC 1.028 01/27/2016 1028   PHURINE 5.5 01/27/2016 1028   GLUCOSEU NEGATIVE 01/27/2016 1028   GLUCOSEU NEG mg/dL 07/23/2006 2113   HGBUR NEGATIVE 01/27/2016 1028   BILIRUBINUR NEGATIVE 01/27/2016 1028   KETONESUR NEGATIVE 01/27/2016 1028   PROTEINUR NEGATIVE 01/27/2016 1028   UROBILINOGEN 1 07/23/2006 2113   NITRITE NEGATIVE 01/27/2016 1028   LEUKOCYTESUR NEGATIVE 01/27/2016 1028     STUDIES: Vaginal Ultrasound studies reviewed with the patient  ELIGIBLE FOR AVAILABLE RESEARCH PROTOCOL: no  ASSESSMENT: 61 y.o. [place] woman   (1) status post right lumpectomy and sentinel lymph node sampling October 2005 for a 0.6 cm invasive ductal carcinoma involving one out of 2 sentinel lymph nodes sampled, grade 3, triple-negative, treated adjuvantly with doxorubicin and cyclophosphamide 4 followed by weekly paclitaxel 7, followed by adjuvant radiation  RECURRENT DISEASE: (2) status post right breast upper outer quadrant biopsy and right axillary lymph node biopsy 10/01/2016, both positive for a T2 N1, stage IIIB invasive ductal carcinoma, triple negative, with an MIB-1 of 50-70%  (3) status post right modified radical mastectomy 11/05/2016 showing a pT2 pN1, stage IIIB invasive ductal carcinoma, grade 3, triple negative, with negative margins  (4) Not a candidate for radiation given prior history  (5) adjuvant chemotherapy will consist of carboplatin and gemcitabine given days 1 and 8 of each 21 day cycle, for 6 cycles, starting 11/20/2016  (a) day 8 cycle 2 omitted because of neutropenia; Neupogen/Neulasta added  (5) HIV positivity  PLAN:   Valoree is doing well today.  Her labs have remained stable, and she will proceed with her final cycle  of treatment today.  She also met with Dr. Jana Hakim today, who congratulated on doing so well with treatment.  She will need to f/u in a  couple of months.  After reviewing this with her, she would like a SCP visit as well.   The above plan was discussed with Jessamy in detail.  She verbalized understanding and knows to call prior to her next appointment for any questions or concerns that she may have.    A total of (20) minutes of face-to-face time was spent with this patient with greater than 50% of that time in counseling and care-coordination.   Scot Dock, NP   03/11/2017 11:46 AM Medical Oncology and Hematology [facility] [address] [place], [place] [address] Tel. [phone]    Fax. [phone]   ADDENDUM: Kariel completes her treatments today. I am delighted that she tolerated her chemotherapy so well. In particularly where no intercurrent infections and her CD4 count held up.  She will see [place] again in November. If all goes well at that time she will be restaged late December. Ideally we will be able to obtain a PET scan  I personally saw this patient and performed a substantive portion of this encounter with the listed APP documented above.   Chauncey Cruel, MD Medical Oncology and Hematology [facility] [address] [place],  [address] Tel. [phone]    Fax. [phone]

## 2017-03-14 ENCOUNTER — Other Ambulatory Visit: Payer: Self-pay | Admitting: General Surgery

## 2017-03-18 ENCOUNTER — Ambulatory Visit: Payer: Medicare HMO | Admitting: Adult Health

## 2017-03-20 ENCOUNTER — Other Ambulatory Visit: Payer: Medicare HMO

## 2017-03-20 DIAGNOSIS — I10 Essential (primary) hypertension: Secondary | ICD-10-CM

## 2017-03-20 DIAGNOSIS — E890 Postprocedural hypothyroidism: Secondary | ICD-10-CM | POA: Diagnosis not present

## 2017-03-20 LAB — CBC WITH DIFFERENTIAL/PLATELET
BASOS ABS: 11 {cells}/uL (ref 0–200)
Basophils Relative: 0.1 %
EOS PCT: 0.4 %
Eosinophils Absolute: 45 cells/uL (ref 15–500)
HCT: 22.8 % — ABNORMAL LOW (ref 35.0–45.0)
Hemoglobin: 7.6 g/dL — ABNORMAL LOW (ref 11.7–15.5)
Lymphs Abs: 1971 cells/uL (ref 850–3900)
MCH: 37.3 pg — ABNORMAL HIGH (ref 27.0–33.0)
MCHC: 33.3 g/dL (ref 32.0–36.0)
MCV: 111.8 fL — AB (ref 80.0–100.0)
MONOS PCT: 9.4 %
MPV: 12.5 fL (ref 7.5–12.5)
NEUTROS ABS: 8120 {cells}/uL — AB (ref 1500–7800)
NEUTROS PCT: 72.5 %
PLATELETS: 26 10*3/uL — AB (ref 140–400)
RBC: 2.04 10*6/uL — ABNORMAL LOW (ref 3.80–5.10)
RDW: 15.7 % — AB (ref 11.0–15.0)
TOTAL LYMPHOCYTE: 17.6 %
WBC mixed population: 1053 cells/uL — ABNORMAL HIGH (ref 200–950)
WBC: 11.2 10*3/uL — ABNORMAL HIGH (ref 3.8–10.8)

## 2017-03-20 LAB — COMPLETE METABOLIC PANEL WITH GFR
AG RATIO: 1.4 (calc) (ref 1.0–2.5)
ALT: 15 U/L (ref 6–29)
AST: 15 U/L (ref 10–35)
Albumin: 3.7 g/dL (ref 3.6–5.1)
Alkaline phosphatase (APISO): 145 U/L — ABNORMAL HIGH (ref 33–130)
BILIRUBIN TOTAL: 0.2 mg/dL (ref 0.2–1.2)
BUN / CREAT RATIO: 15 (calc) (ref 6–22)
BUN: 20 mg/dL (ref 7–25)
CHLORIDE: 103 mmol/L (ref 98–110)
CO2: 29 mmol/L (ref 20–32)
Calcium: 9.2 mg/dL (ref 8.6–10.4)
Creat: 1.36 mg/dL — ABNORMAL HIGH (ref 0.50–0.99)
GFR, EST AFRICAN AMERICAN: 49 mL/min/{1.73_m2} — AB (ref >=60)
GFR, Est Non African American: 42 mL/min/{1.73_m2} — ABNORMAL LOW (ref >=60)
GLOBULIN: 2.7 g/dL (ref 1.9–3.7)
Glucose, Bld: 66 mg/dL (ref 65–99)
POTASSIUM: 4 mmol/L (ref 3.5–5.3)
SODIUM: 140 mmol/L (ref 135–146)
TOTAL PROTEIN: 6.4 g/dL (ref 6.1–8.1)

## 2017-03-20 LAB — LIPID PANEL
Cholesterol: 133 mg/dL (ref <200)
HDL: 59 mg/dL (ref >50)
LDL Cholesterol (Calc): 55 mg/dL (calc)
NON-HDL CHOLESTEROL (CALC): 74 mg/dL (ref <130)
TRIGLYCERIDES: 111 mg/dL (ref <150)
Total CHOL/HDL Ratio: 2.3 (calc) (ref <5.0)

## 2017-03-23 NOTE — H&P (Signed)
Kayla Price Location: [facility] Surgery Patient #: [id number] DOB: July 10, 1955 Widowed / Language: [place] / Race: Black or African American Female       History of Present Illness  The patient is a 61 year old female presenting for a post-operative visit. This is a 61 year old female who returns for another postop visit.  Past history significant for right breast lumpectomy and sentinel node biopsy 2005 by Dr. Maple Hudson.Marland Kitchen Positive nodes. He had radiation therapy and chemotherapy and took tamoxifen.  Because of recurrence in the right breast which was a triple negative breast cancer she was taken to surgery on Nov 05, 2016 and underwent Port-A-Cath insertion, right modified radical mastectomy. She has a 2.6 cm invasive carcinoma with negative margins and metastatic disease in 2 out of 10 lymph nodes with extranodal extension. Dr. Darnelle Catalan has completed  chemotherapy. .  Comorbidities include HIV positivity with undetectable levels on 3 drugs. BMI 40. Hypertension. CKD. History Graves' disease treated with RAI followed by Dr. Everardo All  Exam today was good. The port and the mastectomy incision looked great. Refer to physical therapy and return to see me in 2-3 weeks.  Schedule port removal after chemotherapy completed     Allergies  Lisinopril *ANTIHYPERTENSIVES*  Allergies Reconciled   Medication History  Coenzyme Q10 (Oral) Specific strength unknown - Active. Prochlorperazine Maleate (10MG  Tablet, Oral) Active. Gabapentin (300MG  Capsule, Oral) Active. Lidocaine-Prilocaine (2.5-2.5% Cream, External) Active. Hydrocodone-Acetaminophen (5-325MG  Tablet, Oral) Active. Illusions C Breast Prosthesis (1 (one)) Active. [phone] Post-mastectomy camisole - to hold drain tubes and wear during healing.; QTY: 2; Length of use: 3 Months 3 Refills) Levothyroxine Sodium ( Tablet, Oral) Active. Methocarbamol (500MG  Tablet, Oral) Active. Vitamin D3 (Oral) Specific  strength unknown - Active. Edurant (25MG  Tablet, Oral) Active. Olmesartan Medoxomil-HCTZ (40-25MG  Tablet, Oral) Active. Tivicay (50MG  Tablet, Oral) Active. Prezcobix (800-150MG  Tablet, Oral) Active. Medications Reconciled  Vitals  Weight: 258.2 lb Height: 67in Body Surface Area: 2.25 m Body Mass Index: 40.44 kg/m  Pulse: 68 (Regular)  BP: 118/70 (Sitting, Left Arm, Standard)       Physical Exam  Breast Note: Right mastectomy incision looks very good. No fluid collections. Skin flaps appear adherent to chest wall.. No arm swelling. Range of motion right shoulder is about 150 abduction. Port site left infraclavicular area looks good.     Assessment & Plan  RECURRENT BREAST CANCER, RIGHT (C50.911)   Your right mastectomy wound looks very good. You have completed your chemotherapy You have requested that we remove your port and that will be scheduled.  HISTORY OF RIGHT BREAST CANCER (Z85.3) HIV (HUMAN IMMUNODEFICIENCY VIRUS INFECTION) (B20) BMI 40.0-44.9, ADULT (Z68.41) HYPERTENSION, BENIGN (I10) HISTORY OF GRAVES' DISEASE (Z86.39) CKD (CHRONIC KIDNEY DISEASE), STAGE III (N18.3)    Willine Schwalbe M. Derrell Lolling, M.D., [facility] General and Minimally invasive Surgery Breast and Colorectal Surgery Office:   [phone] Pager:   [phone]

## 2017-03-25 ENCOUNTER — Ambulatory Visit: Payer: Medicare HMO | Admitting: Internal Medicine

## 2017-03-25 ENCOUNTER — Encounter: Payer: Self-pay | Admitting: *Deleted

## 2017-03-25 NOTE — Pre-Procedure Instructions (Signed)
Kayla Price  03/25/2017      [id number] - [place], [place] - [place] [address] [place] [address] Phone: [phone] Fax: [phone]  [facility] - [address] [place] [address] Phone: [phone] Fax: [phone]  [place], [place] - [address] [facility] [address] [place] [address] Phone: [phone] Fax: [place], [place] [address] Phone: [phone] Fax: [phone]    Your procedure is scheduled on  Oct 5  Report to [facility] at  1200  Call this number if you have problems the morning of surgery:  [phone]   Remember:  Do not eat food or drink liquids after midnight.  Take these medicines the morning of surgery with A SIP OF WATER Gabapentin (Neurontin), Hydrocodone (Norco) if needed, Levothyroxine (synthroid), Loratadine (Claritin)  Stop taking aspirin, BC's, Goody's, Herbal medications, Fish Oil, Ibuprofen, Advil, Motrin, Aleve, Vitamins    Do not wear jewelry, make-up or nail polish.  Do not wear lotions, powders, or perfumes, or deoderant.  Do not shave 48 hours prior to surgery.  Men may shave face and neck.  Do not bring valuables to the hospital.  [facility] is not responsible for any belongings or valuables.  Contacts, dentures or bridgework may not be worn into surgery.  Leave your suitcase in the car.  After surgery it may be brought to your room.  For patients admitted to the hospital, discharge time will be determined by your treatment team.  Patients discharged the day of surgery will not be allowed to drive home.   Special instructions:  [facility] - Preparing for Surgery  Before surgery, you can play an important role.   Because skin is not sterile, your skin needs to be as free of germs as possible.  You can reduce the number of germs on you skin by washing with CHG (chlorahexidine gluconate) soap before surgery.  CHG is an antiseptic cleaner which kills germs and bonds with the skin to continue killing germs even after washing.  Please DO NOT use if you have an allergy to CHG or antibacterial soaps.  If your skin becomes reddened/irritated stop using the CHG and inform your nurse when you arrive at Short Stay.  Do not shave (including legs and underarms) for at least 48 hours prior to the first CHG shower.  You may shave your face.  Please follow these instructions carefully:   1.  Shower with CHG Soap the night before surgery and the                                morning of Surgery.  2.  If you choose to wash your hair, wash your hair first as usual with your       normal shampoo.  3.  After you shampoo, rinse your hair and body thoroughly to remove the                      Shampoo.  4.  Use CHG as you would any other liquid soap.  You can apply chg directly       to the skin and wash gently with scrungie  or a clean washcloth.  5.  Apply the CHG Soap to your body ONLY FROM THE NECK DOWN.        Do not use on open wounds or open sores.  Avoid contact with your eyes,       ears, mouth and genitals (private parts).  Wash genitals (private parts)       with your normal soap.  6.  Wash thoroughly, paying special attention to the area where your surgery        will be performed.  7.  Thoroughly rinse your body with warm water from the neck down.  8.  DO NOT shower/wash with your normal soap after using and rinsing off       the CHG Soap.  9.  Pat yourself dry with a clean towel.            10.  Wear clean pajamas.            11.  Place clean sheets on your bed the night of your first shower and do not        sleep with pets.  Day of Surgery  Do not apply any lotions/deoderants the morning of surgery.  Please wear  clean clothes to the hospital/surgery center.    Please read over the following fact sheets that you were given. Pain Booklet, Coughing and Deep Breathing and Surgical Site Infection Prevention

## 2017-03-26 ENCOUNTER — Encounter (HOSPITAL_COMMUNITY): Payer: Self-pay

## 2017-03-26 ENCOUNTER — Encounter (HOSPITAL_COMMUNITY)
Admission: RE | Admit: 2017-03-26 | Discharge: 2017-03-26 | Disposition: A | Discharge status: Home / Self Care | Payer: Medicare HMO | Source: Ambulatory Visit | Attending: General Surgery | Admitting: General Surgery

## 2017-03-26 ENCOUNTER — Other Ambulatory Visit: Payer: Self-pay

## 2017-03-26 ENCOUNTER — Encounter (INDEPENDENT_AMBULATORY_CARE_PROVIDER_SITE_OTHER): Payer: Self-pay | Admitting: *Deleted

## 2017-03-26 VITALS — BP 105/71 | HR 69 | Temp 97.2°F | Wt 258.5 lb

## 2017-03-26 DIAGNOSIS — Z9221 Personal history of antineoplastic chemotherapy: Secondary | ICD-10-CM | POA: Diagnosis not present

## 2017-03-26 DIAGNOSIS — Z006 Encounter for examination for normal comparison and control in clinical research program: Secondary | ICD-10-CM

## 2017-03-26 DIAGNOSIS — E0500 Thyrotoxicosis with diffuse goiter without thyrotoxic crisis or storm: Secondary | ICD-10-CM | POA: Diagnosis not present

## 2017-03-26 DIAGNOSIS — Z79899 Other long term (current) drug therapy: Secondary | ICD-10-CM | POA: Diagnosis not present

## 2017-03-26 DIAGNOSIS — Z6841 Body Mass Index (BMI) 40.0 and over, adult: Secondary | ICD-10-CM | POA: Diagnosis not present

## 2017-03-26 DIAGNOSIS — Z853 Personal history of malignant neoplasm of breast: Secondary | ICD-10-CM | POA: Diagnosis not present

## 2017-03-26 DIAGNOSIS — Z21 Asymptomatic human immunodeficiency virus [HIV] infection status: Secondary | ICD-10-CM | POA: Diagnosis not present

## 2017-03-26 DIAGNOSIS — Z9011 Acquired absence of right breast and nipple: Secondary | ICD-10-CM | POA: Diagnosis not present

## 2017-03-26 DIAGNOSIS — N183 Chronic kidney disease, stage 3 (moderate): Secondary | ICD-10-CM | POA: Diagnosis not present

## 2017-03-26 DIAGNOSIS — Z452 Encounter for adjustment and management of vascular access device: Secondary | ICD-10-CM | POA: Diagnosis not present

## 2017-03-26 DIAGNOSIS — I129 Hypertensive chronic kidney disease with stage 1 through stage 4 chronic kidney disease, or unspecified chronic kidney disease: Secondary | ICD-10-CM | POA: Diagnosis not present

## 2017-03-26 HISTORY — DX: Personal history of urinary calculi: Z87.442

## 2017-03-26 LAB — BASIC METABOLIC PANEL
ANION GAP: 7 (ref 5–15)
BUN: 11 mg/dL (ref 6–20)
CALCIUM: 9.8 mg/dL (ref 8.9–10.3)
CHLORIDE: 103 mmol/L (ref 101–111)
CO2: 28 mmol/L (ref 22–32)
Creatinine, Ser: 1.43 mg/dL — ABNORMAL HIGH (ref 0.44–1.00)
GFR calc non Af Amer: 39 mL/min — ABNORMAL LOW (ref >60)
GFR, EST AFRICAN AMERICAN: 45 mL/min — AB (ref >60)
Glucose, Bld: 84 mg/dL (ref 65–99)
Potassium: 3.7 mmol/L (ref 3.5–5.1)
SODIUM: 138 mmol/L (ref 135–145)

## 2017-03-26 LAB — CBC
HCT: 26.3 % — ABNORMAL LOW (ref 36.0–46.0)
HEMOGLOBIN: 8.4 g/dL — AB (ref 12.0–15.0)
MCH: 37.2 pg — AB (ref 26.0–34.0)
MCHC: 31.9 g/dL (ref 30.0–36.0)
MCV: 116.4 fL — AB (ref 78.0–100.0)
PLATELETS: 183 10*3/uL (ref 150–400)
RBC: 2.26 MIL/uL — ABNORMAL LOW (ref 3.87–5.11)
RDW: 18.9 % — ABNORMAL HIGH (ref 11.5–15.5)
WBC: 8.1 10*3/uL (ref 4.0–10.5)

## 2017-03-26 NOTE — Progress Notes (Signed)
Kayla Price is here for month 16 Reprieve visit, A Randomized Trial to Prevent Vascular Events in HIV. Has completed her chemo and is scheduled to have her port-a-cath removed 10/5. No new complaints or concerns. Denies any muscles aches or weakness. Verbalized excellent adherence with her medications. She will return in February for her next visit.

## 2017-03-26 NOTE — Progress Notes (Signed)
PCP is [name], NP Denies ever seeing a cardiologist. Denies any chest pain, cough, or fever. Denies ever having a stress test, echo, or card cath. Boost given with instructions to drink by 1000 on day of surgery. Voices understanding with teach back noted. (ERAS)

## 2017-03-27 NOTE — Progress Notes (Signed)
Anesthesia Chart Review: Patient is a 61 year old female scheduled for removal of Port-a-cath on 03/29/17 by Dr. Fanny Skates.  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 - HEM-ONC is Dr. Lurline Del. - Endocrinologist is Dr. Renato Shin. - ID is Dr. Rhina Brackett Dam, last visit 03/06/17. CD4 dipped while on chemotherapy, so she was started on Bactrim. Recommendations include continue Prezcobix and Juluca with food (avoiding PPI, H2 blockers). She declined adding Maraviroc.   Meds include Prezcobix, Juluca, Neurontin, levothyroxine, Norco, Claritin, Robaxin, fish oil, Pitavastatin, Bactrim, Dyazide.   BP 130/74   Pulse 72   Temp 36.8 C   Resp 18   Ht 5\' 9"  (1.753 m)   Wt 257 lb 14.4 oz (117 kg)   LMP 11/06/2003   SpO2 99%   BMI 38.09 kg/m   EKG 03/26/17: NSR, minimal voltage criteria for LVH, may be normal variant. No significant change since last tracing.  CT chest/abd/pelvis 11/16/16: IMPRESSION: 1. Several findings warrant surveillance including a 9 mm left subpectoral lymph node and a 1.2 cm left external iliac lymph node, as well as a 4 mm nodule in the right upper lobe. Overall these findings are probably benign but warrants surveillance in this setting. Otherwise, no obvious/specific signs of active malignancy. 2. Recent right mastectomy. 3. Cholelithiasis. 4. Mild diverticulosis without active diverticulitis. 5. The endometrium is thickened at up to 11  mm. On prior sonographic workup in 2011, the patient had an even more thickened endometrium in the setting of postmenopausal bleeding, correlation without workup is recommended. The current endometrial thickness of 11 mm is considered abnormal, and pelvic sonography should be strongly considered. 6. Mild lower lumbar impingement related to spondylosis and degenerative disc disease. (Dr. Tommy Medal referred to Dr. Kennon Rounds 03/06/17 for uterine wall thickening.)  Preoperative labs noted. Cr 1.43, appears stable. BUN 11. H/H 8.4/26.3, up from 7.6/22.8 on 03/20/17. (Completed chemotherapy 03/11/17). PLT 183K. Glucose 84.   Although she is anemic, her H/H is improved from a few weeks ago. She is for port removal, so I did not order a T&S. If no acute changes then I anticipate that she can proceed as planned.  George Hugh [facility] Short Stay Center/Anesthesiology Phone [phone] 03/27/2017 12:51 PM

## 2017-03-28 MED ORDER — CELECOXIB 200 MG PO CAPS
200.0000 mg | ORAL_CAPSULE | ORAL | Status: AC
  Administered 2017-03-29: 200 mg via ORAL
  Filled 2017-03-28: qty 1

## 2017-03-28 MED ORDER — GABAPENTIN 300 MG PO CAPS
300.0000 mg | ORAL_CAPSULE | ORAL | Status: DC
  Filled 2017-03-28: qty 1

## 2017-03-28 MED ORDER — ACETAMINOPHEN 500 MG PO TABS
1000.0000 mg | ORAL_TABLET | ORAL | Status: AC
  Administered 2017-03-29: 1000 mg via ORAL
  Filled 2017-03-28: qty 2

## 2017-03-28 MED ORDER — CEFAZOLIN SODIUM 10 G IJ SOLR
3.0000 g | INTRAMUSCULAR | Status: AC
  Administered 2017-03-29: 3 g via INTRAVENOUS
  Filled 2017-03-28: qty 3000

## 2017-03-29 ENCOUNTER — Ambulatory Visit (HOSPITAL_COMMUNITY)
Admission: RE | Admit: 2017-03-29 | Discharge: 2017-03-29 | Disposition: A | Discharge status: Home / Self Care | Payer: Medicare HMO | Source: Ambulatory Visit | Attending: General Surgery | Admitting: General Surgery

## 2017-03-29 ENCOUNTER — Encounter (HOSPITAL_COMMUNITY): Payer: Self-pay

## 2017-03-29 ENCOUNTER — Encounter (HOSPITAL_COMMUNITY)
Admission: RE | Disposition: A | Discharge status: Home / Self Care | Payer: Self-pay | Source: Ambulatory Visit | Attending: General Surgery

## 2017-03-29 ENCOUNTER — Ambulatory Visit (HOSPITAL_COMMUNITY): Payer: Medicare HMO | Admitting: Vascular Surgery

## 2017-03-29 ENCOUNTER — Ambulatory Visit (HOSPITAL_COMMUNITY): Payer: Medicare HMO | Admitting: Certified Registered"

## 2017-03-29 DIAGNOSIS — Z853 Personal history of malignant neoplasm of breast: Secondary | ICD-10-CM | POA: Insufficient documentation

## 2017-03-29 DIAGNOSIS — C50411 Malignant neoplasm of upper-outer quadrant of right female breast: Secondary | ICD-10-CM | POA: Diagnosis not present

## 2017-03-29 DIAGNOSIS — Z79899 Other long term (current) drug therapy: Secondary | ICD-10-CM | POA: Insufficient documentation

## 2017-03-29 DIAGNOSIS — I129 Hypertensive chronic kidney disease with stage 1 through stage 4 chronic kidney disease, or unspecified chronic kidney disease: Secondary | ICD-10-CM | POA: Diagnosis not present

## 2017-03-29 DIAGNOSIS — Z21 Asymptomatic human immunodeficiency virus [HIV] infection status: Secondary | ICD-10-CM | POA: Diagnosis not present

## 2017-03-29 DIAGNOSIS — N183 Chronic kidney disease, stage 3 (moderate): Secondary | ICD-10-CM | POA: Diagnosis not present

## 2017-03-29 DIAGNOSIS — Z9221 Personal history of antineoplastic chemotherapy: Secondary | ICD-10-CM | POA: Insufficient documentation

## 2017-03-29 DIAGNOSIS — Z9011 Acquired absence of right breast and nipple: Secondary | ICD-10-CM | POA: Diagnosis not present

## 2017-03-29 DIAGNOSIS — Z452 Encounter for adjustment and management of vascular access device: Secondary | ICD-10-CM | POA: Diagnosis not present

## 2017-03-29 DIAGNOSIS — E0500 Thyrotoxicosis with diffuse goiter without thyrotoxic crisis or storm: Secondary | ICD-10-CM | POA: Insufficient documentation

## 2017-03-29 DIAGNOSIS — Z6841 Body Mass Index (BMI) 40.0 and over, adult: Secondary | ICD-10-CM | POA: Insufficient documentation

## 2017-03-29 DIAGNOSIS — Z171 Estrogen receptor negative status [ER-]: Secondary | ICD-10-CM

## 2017-03-29 HISTORY — PX: PORT-A-CATH REMOVAL: SHX5289

## 2017-03-29 SURGERY — REMOVAL PORT-A-CATH
Anesthesia: Monitor Anesthesia Care | Site: Chest

## 2017-03-29 MED ORDER — MEPERIDINE HCL 25 MG/ML IJ SOLN: 6.2500 mg | INTRAMUSCULAR | Status: DC | PRN

## 2017-03-29 MED ORDER — CHLORHEXIDINE GLUCONATE CLOTH 2 % EX PADS: 6.0000 | MEDICATED_PAD | Freq: Once | CUTANEOUS | Status: DC

## 2017-03-29 MED ORDER — SODIUM CHLORIDE 0.9 % IV SOLN
INTRAVENOUS | Status: DC
  Administered 2017-03-29: 12:00:00 via INTRAVENOUS

## 2017-03-29 MED ORDER — MIDAZOLAM HCL 5 MG/5ML IJ SOLN
INTRAMUSCULAR | Status: DC | PRN
  Administered 2017-03-29: 2 mg via INTRAVENOUS

## 2017-03-29 MED ORDER — PROPOFOL 500 MG/50ML IV EMUL
INTRAVENOUS | Status: DC | PRN
  Administered 2017-03-29: 100 ug/kg/min via INTRAVENOUS

## 2017-03-29 MED ORDER — FENTANYL CITRATE (PF) 250 MCG/5ML IJ SOLN
INTRAMUSCULAR | Status: AC
  Filled 2017-03-29: qty 5

## 2017-03-29 MED ORDER — LIDOCAINE-EPINEPHRINE 1 %-1:100000 IJ SOLN
INTRAMUSCULAR | Status: AC
  Filled 2017-03-29: qty 1

## 2017-03-29 MED ORDER — FENTANYL CITRATE (PF) 250 MCG/5ML IJ SOLN
INTRAMUSCULAR | Status: DC | PRN
  Administered 2017-03-29: 50 ug via INTRAVENOUS

## 2017-03-29 MED ORDER — MIDAZOLAM HCL 2 MG/2ML IJ SOLN
INTRAMUSCULAR | Status: AC
  Filled 2017-03-29: qty 2

## 2017-03-29 MED ORDER — FENTANYL CITRATE (PF) 100 MCG/2ML IJ SOLN: 25.0000 ug | INTRAMUSCULAR | Status: DC | PRN

## 2017-03-29 MED ORDER — METOCLOPRAMIDE HCL 5 MG/ML IJ SOLN: 10.0000 mg | Freq: Once | INTRAMUSCULAR | Status: DC | PRN

## 2017-03-29 MED ORDER — SODIUM BICARBONATE 4 % IV SOLN
INTRAVENOUS | Status: DC | PRN
  Administered 2017-03-29: 5 mL via INTRAVENOUS

## 2017-03-29 MED ORDER — LACTATED RINGERS IV SOLN: INTRAVENOUS | Status: DC

## 2017-03-29 MED ORDER — PROPOFOL 1000 MG/100ML IV EMUL
INTRAVENOUS | Status: AC
  Filled 2017-03-29: qty 100

## 2017-03-29 MED ORDER — LIDOCAINE-EPINEPHRINE (PF) 1 %-1:200000 IJ SOLN
INTRAMUSCULAR | Status: DC | PRN
  Administered 2017-03-29: 30 mL

## 2017-03-29 MED ORDER — ONDANSETRON HCL 4 MG/2ML IJ SOLN
INTRAMUSCULAR | Status: AC
  Filled 2017-03-29: qty 2

## 2017-03-29 MED ORDER — ONDANSETRON HCL 4 MG/2ML IJ SOLN
INTRAMUSCULAR | Status: DC | PRN
  Administered 2017-03-29: 4 mg via INTRAVENOUS

## 2017-03-29 MED ORDER — 0.9 % SODIUM CHLORIDE (POUR BTL) OPTIME
TOPICAL | Status: DC | PRN
Start: 2017-03-29 — End: 2017-03-29
  Administered 2017-03-29: 1000 mL

## 2017-03-29 MED ORDER — SODIUM BICARBONATE 4 % IV SOLN
INTRAVENOUS | Status: AC
  Filled 2017-03-29: qty 5

## 2017-03-29 MED FILL — PREZCOBIX 800 MG-150 MG TAB: 800-150 | 30 days supply | Qty: 30 | Fill #8

## 2017-03-29 MED FILL — JULUCA 50-25 MG TAB: 50-25 | 30 days supply | Qty: 30 | Fill #4

## 2017-03-29 SURGICAL SUPPLY — 31 items
BLADE SURG 15 STRL LF DISP TIS (BLADE) ×1 IMPLANT
BLADE SURG 15 STRL SS (BLADE) ×2
CHLORAPREP W/TINT 10.5 ML (MISCELLANEOUS) ×3 IMPLANT
COVER SURGICAL LIGHT HANDLE (MISCELLANEOUS) ×3 IMPLANT
DECANTER SPIKE VIAL GLASS SM (MISCELLANEOUS) ×3 IMPLANT
DERMABOND ADVANCED (GAUZE/BANDAGES/DRESSINGS) ×2
DERMABOND ADVANCED .7 DNX12 (GAUZE/BANDAGES/DRESSINGS) ×1 IMPLANT
DRAPE LAPAROTOMY 100X72 PEDS (DRAPES) ×3 IMPLANT
DRAPE UTILITY XL STRL (DRAPES) ×3 IMPLANT
ELECT CAUTERY BLADE 6.4 (BLADE) ×3 IMPLANT
ELECT REM PT RETURN 9FT ADLT (ELECTROSURGICAL) ×3
ELECTRODE REM PT RTRN 9FT ADLT (ELECTROSURGICAL) ×1 IMPLANT
GAUZE SPONGE 4X4 16PLY XRAY LF (GAUZE/BANDAGES/DRESSINGS) ×3 IMPLANT
GLOVE EUDERMIC 7 POWDERFREE (GLOVE) ×3 IMPLANT
GOWN STRL REUS W/ TWL LRG LVL3 (GOWN DISPOSABLE) ×1 IMPLANT
GOWN STRL REUS W/ TWL XL LVL3 (GOWN DISPOSABLE) ×1 IMPLANT
GOWN STRL REUS W/TWL LRG LVL3 (GOWN DISPOSABLE) ×2
GOWN STRL REUS W/TWL XL LVL3 (GOWN DISPOSABLE) ×2
KIT BASIN OR (CUSTOM PROCEDURE TRAY) ×3 IMPLANT
KIT ROOM TURNOVER OR (KITS) ×3 IMPLANT
NEEDLE HYPO 25GX1X1/2 BEV (NEEDLE) ×3 IMPLANT
NS IRRIG 1000ML POUR BTL (IV SOLUTION) ×3 IMPLANT
PACK SURGICAL SETUP 50X90 (CUSTOM PROCEDURE TRAY) ×3 IMPLANT
PAD ARMBOARD 7.5X6 YLW CONV (MISCELLANEOUS) ×3 IMPLANT
PENCIL BUTTON HOLSTER BLD 10FT (ELECTRODE) ×3 IMPLANT
SUT MNCRL AB 4-0 PS2 18 (SUTURE) ×3 IMPLANT
SUT VIC AB 3-0 SH 18 (SUTURE) ×3 IMPLANT
SYR BULB 3OZ (MISCELLANEOUS) ×3 IMPLANT
SYR CONTROL 10ML LL (SYRINGE) ×3 IMPLANT
TOWEL OR 17X24 6PK STRL BLUE (TOWEL DISPOSABLE) ×3 IMPLANT
TOWEL OR 17X26 10 PK STRL BLUE (TOWEL DISPOSABLE) ×3 IMPLANT

## 2017-03-29 NOTE — Anesthesia Postprocedure Evaluation (Signed)
Anesthesia Post Note  Patient: Kayla Price  Procedure(s) Performed: REMOVAL PORT-A-CATH (N/A Chest)     Patient location during evaluation: PACU Anesthesia Type: MAC Level of consciousness: awake and alert Pain management: pain level controlled Vital Signs Assessment: post-procedure vital signs reviewed and stable Respiratory status: spontaneous breathing, nonlabored ventilation, respiratory function stable and patient connected to nasal cannula oxygen Cardiovascular status: stable and blood pressure returned to baseline Postop Assessment: no apparent nausea or vomiting Anesthetic complications: no    Last Vitals:  Vitals:   03/29/17 1350 03/29/17 1354  BP:  136/75  Pulse:  61  Resp:  15  Temp: (!) 36.3 C   SpO2:  100%    Last Pain:  Vitals:   03/29/17 1202  TempSrc: Oral                 [name]

## 2017-03-29 NOTE — Discharge Instructions (Signed)
The superglue on the wound will wear off in 3-4 weeks You may shower starting tomorrow night No tub baths or swimming pools for about 3 weeks Call Dr. Dalbert Batman if there are any problems  Ice pack for 10 minutes at a time,   off and on,  , for the next 24 hours

## 2017-03-29 NOTE — Op Note (Signed)
Patient Name:           Kayla Price   Date of Surgery:        03/29/2017  Pre op Diagnosis:      Cancer right breast  Post op Diagnosis:    Same  Procedure:                 Removal of Port-A-Cath  Surgeon:                     Edsel Petrin. Dalbert Batman, M.D., FACS  Assistant:                      Or staff   Indication for Assistant: N/A  Operative Indications:   This is a 61 year old female who is HIV positive, hypertension, BMI 40.  She had a right breast lumpectomy for breast cancer in 2005.  She developed a recurrence in the right breast which was a triple negative breast cancer and on 11/05/2016 she underwent Port-A-Cath insertion and right modified radical mastectomy.  She has completed her adjuvant chemotherapy and has tolerated that well.  She was referred back to me for Port-A-Cath removal.  I have discussed the indications, techniques and risk of the surgery with her in detail and she agrees with this plan  Operative Findings:       The port and catheter were removed intact without difficulty.  There is no evidence of infection or bleeding  Procedure in Detail:          The patient was brought to the operating room and placed upon the operating table.  She was monitored and sedated by the anesthesia department.  The right upper chest was prepped and draped in a sterile fashion.  Surgical timeout was performed.  Intravenous antibiotics were given.  1% Xylocaine with epinephrine was used as a local infiltration anesthetic.  A transverse incision was made in the right infraclavicular area, through the old scar just below the palpable port.  Dissection was carried down to the port.  The capsule was incised.  The port was mobilized and the propylene sutures cut and removed.  The port and catheter were easily removed.  There was minimal bleeding.  The subcutaneous tissues were closed with 3 interrupted sutures of 3-0 Vicryl and the skin closed with a running subcuticular 4-0 Monocryl and  Dermabond.  The patient tolerated the procedure well was taken to PACU in stable condition.  EBL 10 mL.  Counts correct.  Complications none.     Edsel Petrin. Dalbert Batman, M.D., FACS General and Minimally Invasive Surgery Breast and Colorectal Surgery   Addendum: I logged onto the [facility] website and reviewed her prescription medication history  03/29/2017 1:22 PM

## 2017-03-29 NOTE — Transfer of Care (Signed)
Immediate Anesthesia Transfer of Care Note  Patient: Kayla Price  Procedure(s) Performed: REMOVAL PORT-A-CATH (N/A Chest)  Patient Location: PACU  Anesthesia Type:MAC  Level of Consciousness: drowsy and patient cooperative  Airway & Oxygen Therapy: Patient Spontanous Breathing and Patient connected to face mask oxygen  Post-op Assessment: Report given to RN and Post -op Vital signs reviewed and stable  Post vital signs: Reviewed and stable  Last Vitals:  Vitals:   03/29/17 1202 03/29/17 1328  BP: (!) 156/86   Pulse: 80   Resp: 18   Temp: 36.8 C (!) 36.2 C  SpO2: 100%     Last Pain:  Vitals:   03/29/17 1202  TempSrc: Oral         Complications: No apparent anesthesia complications

## 2017-03-29 NOTE — Interval H&P Note (Signed)
History and Physical Interval Note:  03/29/2017 12:26 PM  Kayla Price  has presented today for surgery, with the diagnosis of BREAST CANCER   The various methods of treatment have been discussed with the patient and family. After consideration of risks, benefits and other options for treatment, the patient has consented to  Procedure(s): REMOVAL PORT-A-CATH (N/A) as a surgical intervention .  The patient's history has been reviewed, patient examined, no change in status, stable for surgery.  I have reviewed the patient's chart and labs.  Questions were answered to the patient's satisfaction.     [name]

## 2017-03-29 NOTE — Anesthesia Preprocedure Evaluation (Addendum)
Anesthesia Evaluation  Patient identified by MRN, date of birth, ID band Patient awake    Reviewed: Allergy & Precautions, NPO status , Patient's Chart, lab work & pertinent test results  History of Anesthesia Complications Negative for: history of anesthetic complications  Airway Mallampati: I   Neck ROM: Full    Dental  (+) Teeth Intact   Pulmonary    breath sounds clear to auscultation       Cardiovascular hypertension, + Peripheral Vascular Disease   Rhythm:Regular Rate:Normal     Neuro/Psych    GI/Hepatic   Endo/Other  Hypothyroidism Morbid obesity  Renal/GU Renal disease     Musculoskeletal  (+) Arthritis ,   Abdominal (+) + obese,   Peds  Hematology  (+) Blood dyscrasia, , HIV,   Anesthesia Other Findings   Reproductive/Obstetrics                             Anesthesia Physical  Anesthesia Plan  ASA: III  Anesthesia Plan: MAC   Post-op Pain Management:    Induction: Intravenous  PONV Risk Score and Plan: 2 and Ondansetron and Dexamethasone  Airway Management Planned: Simple Face Mask  Additional Equipment:   Intra-op Plan:   Post-operative Plan:   Informed Consent: I have reviewed the patients History and Physical, chart, labs and discussed the procedure including the risks, benefits and alternatives for the proposed anesthesia with the patient or authorized representative who has indicated his/her understanding and acceptance.   Dental advisory given  Plan Discussed with:   Anesthesia Plan Comments:        Anesthesia Quick Evaluation

## 2017-03-30 ENCOUNTER — Encounter (HOSPITAL_COMMUNITY): Payer: Self-pay | Admitting: General Surgery

## 2017-03-31 ENCOUNTER — Other Ambulatory Visit: Payer: Self-pay | Admitting: Oncology

## 2017-03-31 NOTE — Progress Notes (Signed)
Kayla Price, Counselor  Kayla Price, Kayla Dad, MD; Kayla Phlegm, NP        Good morning,   This patient is on our cancer registry as someone who has [place] breast cancer at 11. It looks like she has had breast cancer in the past as well. We can see her in genetics. Please refer if she is interested.   Santiago Glad

## 2017-04-04 ENCOUNTER — Other Ambulatory Visit: Payer: Self-pay

## 2017-04-04 MED ORDER — OLMESARTAN MEDOXOMIL-HCTZ 40-25 MG PO TABS: 1.0000 | ORAL_TABLET | Freq: Every day | ORAL | 1 refills | Status: DC

## 2017-04-10 ENCOUNTER — Telehealth: Payer: Self-pay

## 2017-04-10 NOTE — Telephone Encounter (Signed)
Patient states she has finished her chemotherapy and would like to know if she should continue Bactrim.   Please advise.   Laverle Patter, RN

## 2017-04-11 NOTE — Telephone Encounter (Signed)
Have we rechecked her CD4 count?

## 2017-04-12 NOTE — Telephone Encounter (Addendum)
CD4 on 03/04/17 was 610. Patient is calling back today asking about the bactrim. Patient said to please leave a voice mail.

## 2017-04-12 NOTE — Telephone Encounter (Signed)
Per Dr. Tommy Medal patient can stop the Bactrim. She has been notified. Myrtis Hopping

## 2017-04-15 ENCOUNTER — Other Ambulatory Visit: Payer: Self-pay | Admitting: *Deleted

## 2017-04-15 MED ORDER — HYDROCODONE-ACETAMINOPHEN 5-325 MG PO TABS: 1.0000 | ORAL_TABLET | Freq: Four times a day (QID) | ORAL | 0 refills | Status: DC | PRN

## 2017-04-15 NOTE — Telephone Encounter (Signed)
Patient notified and agreed.  Keys Database Verified.

## 2017-04-24 ENCOUNTER — Ambulatory Visit (INDEPENDENT_AMBULATORY_CARE_PROVIDER_SITE_OTHER): Payer: Medicare HMO | Admitting: Internal Medicine

## 2017-04-24 ENCOUNTER — Encounter: Payer: Self-pay | Admitting: Internal Medicine

## 2017-04-24 VITALS — BP 126/84 | HR 76 | Temp 97.8°F | Resp 16 | Ht 69.0 in | Wt 253.8 lb

## 2017-04-24 DIAGNOSIS — B20 Human immunodeficiency virus [HIV] disease: Secondary | ICD-10-CM | POA: Diagnosis not present

## 2017-04-24 DIAGNOSIS — N183 Chronic kidney disease, stage 3 unspecified: Secondary | ICD-10-CM

## 2017-04-24 DIAGNOSIS — I10 Essential (primary) hypertension: Secondary | ICD-10-CM | POA: Diagnosis not present

## 2017-04-24 DIAGNOSIS — C50911 Malignant neoplasm of unspecified site of right female breast: Secondary | ICD-10-CM

## 2017-04-24 DIAGNOSIS — E890 Postprocedural hypothyroidism: Secondary | ICD-10-CM

## 2017-04-24 DIAGNOSIS — E010 Iodine-deficiency related diffuse (endemic) goiter: Secondary | ICD-10-CM

## 2017-04-24 NOTE — Patient Instructions (Addendum)
Continue current medications as ordered  Follow up with specialists as scheduled  Follow up in 6 mos for HTN, hyperlipidemia, breast cancer and CKD with Janett Billow,

## 2017-04-24 NOTE — Progress Notes (Signed)
Patient ID: Kayla Price, female   DOB: 01/20/1956, 61 y.o.   MRN: [id number]    Location:  [facility] Place of Service: OFFICE  Chief Complaint  Patient presents with  . Follow-up    6 month    HPI:  61 yo female seen today for f/u. 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 She completed chemotx at the end of Sept 2018. Not a candidate for XRT 2/2 prior hx. Port-a-cath removed 1 month ago. No lymphedema  She also has episodes of choking when she flexes her neck and is c/a thyroid enlargement. She was seen by GYN Dr Kennon Rounds in Aug 2018 for endometrial thickening. bx revealed atrophy of endometrium but no malignant changes. No vaginal bleeding  HIV - on triple tx; followed by ID Dr Tommy Medal. HIV1 RNA quant 30; log 1.48. CD4 610.  HTN/hyperlipidemia - stable on benicar hct. Cholesterol diet controlled. LDL 76  Thyroid - stable on levothyroxine. TSH 2.45. Followed by Dr Loanne Drilling  PVD - stable   Hyperglycemia - A1c 5.2%.   CKD - stage 3. Cr 1.43. She reports urinary frequency x 1 yr. She takes a diuretic. Hgb 8.4  Right breast CA with recurrence - s/p right modified radical mastectomy 10/06/16, stage 3B invasive ductal carcinoma, grade 3, triple negative with neg margins; completed chemotx; followed by Dr Jana Hakim. Not XRT candidate due to previous hx. She has peripheral neuropathy and bone pain, worse in LLE. She is taking loratidine which is helping. She stopped gabapentin as it was ineffective. Hgb 8.4   Past Medical History:  Diagnosis Date  . Alopecia areata 11/28/2009  . Bell's palsy   . BREAST CANCER, HX OF 2005  . Cancer [facility]) 2005   Breast cancer   chemotherapy and radiation  . CKD (chronic kidney disease) stage 3, GFR 30-59 ml/min (HCC) 06/08/2015  . Dry eye syndrome   . Fasting hyperglycemia   . HIP FRACTURE, RIGHT 05/06/2008  .  History of kidney stones   . HIV (human immunodeficiency virus infection) ([place])   . HIV DISEASE 03/27/2006  . HYPERLIPIDEMIA, MIXED 12/15/2007  . HYPERTENSION 03/27/2006  . 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 MENORRHAGIA, POSTMENOPAUSAL 02/03/2009  . Osteoarthritis of left knee 06/08/2015  . OSTEOARTHROSIS, LOCAL, SCND, UNSPC SITE 04/07/2007  . PVD 04/07/2007  . Tinea capitis   . TRIGGER FINGER 05/06/2008  . Unspecified vitamin D deficiency 08/06/2007    Past Surgical History:  Procedure Laterality Date  . BREAST LUMPECTOMY Right    2005  . COLONOSCOPY    . COLONOSCOPY WITH ESOPHAGOGASTRODUODENOSCOPY (EGD)    . HYSTEROSCOPY  2006  . KNEE ARTHROSCOPY Left 2002  . LYMPH NODE DISSECTION  2005  . MASTECTOMY MODIFIED RADICAL Right 11/05/2016   Procedure: RIGHT MASTECTOMY MODIFIED RADICAL;  Surgeon: Fanny Skates, MD;  Location: [place];  Service: General;  Laterality: Right;  . MODIFIED RADICAL MASTECTOMY Right 11/05/2016  . placement   of port-a-cath    . PORT-A-CATH REMOVAL  2006   insertion 2005  . PORT-A-CATH REMOVAL N/A 03/29/2017   Procedure: REMOVAL PORT-A-CATH;  Surgeon: Fanny Skates, MD;  Location: [place];  Service: General;  Laterality: N/A;  . PORTACATH PLACEMENT Right 11/05/2016   Procedure: INSERTION PORT-A-CATH WITH ULTRA SOUND;  Surgeon: Fanny Skates, MD;  Location: [place];  Service: General;  Laterality: Right;  . removal of port a  cath  12/2014  . THYROID SURGERY  2009   Ablation   . TOTAL KNEE ARTHROPLASTY Left 02/06/2016   Procedure: TOTAL KNEE ARTHROPLASTY;  Surgeon: Frederik Pear, MD;  Location: [place];  Service: Orthopedics;  Laterality: Left;  . TUBAL LIGATION      Patient Care Team: Lauree Chandler, NP as PCP - General (Nurse Practitioner) Tommy Medal, Lavell Islam, MD as PCP - Infectious Diseases (Infectious Diseases) Clent Jacks, MD as Consulting Physician (Ophthalmology) Renato Shin, MD as Consulting Physician (Endocrinology) Frederik Pear, MD as  Consulting Physician (Orthopedic Surgery) Magrinat, Virgie Dad, MD as Consulting Physician (Oncology) Fanny Skates, MD as Consulting Physician (General Surgery) Donnamae Jude, MD as Consulting Physician (Obstetrics and Gynecology)  Social History   Social History  . Marital status: Widowed    Spouse name: N/A  . Number of children: N/A  . Years of education: N/A   Occupational History  . Postal Worker Unemployed   Social History Main Topics  . Smoking status: Never Smoker  . Smokeless tobacco: Never Used  . Alcohol use No  . Drug use: No  . Sexual activity: Not Currently    Birth control/ protection: Post-menopausal     Comment: declined condoms   Other Topics Concern  . Not on file   Social History Narrative   Single/widow   Diet: good   Do you drink/eat things with caffeine? Tea occasionally   Marital status: Widowed  What year were you married? 1993   Do you live in a house, apartment,assisted living, condo,trailer,ect.)? House   Is it one or more stories? Two   How many persons live in your home? 4   Do you have any pets in you home? No   Current or past profession: Tour manager   Do you exercise? Yes  Type&how often: Stationary Bike, Water Exercise 3-4 x week   Do you have a living will? Yes   Do you have a DNR form? No  If not do you what one?   Do you have signed POA/HPOA forms? No   If so, please bring to your appointment.                    reports that she has never smoked. She has never used smokeless tobacco. She reports that she does not drink alcohol or use drugs.  Family History  Problem Relation Age of Onset  . Heart attack Brother        Massive MI in 59s  . Stroke Brother        CAD  . Kidney disease Mother   . Stroke Mother   . Diabetes Mother   . Liver disease Sister   . COPD Sister        had I-131 rx of hyperthyroidism  . Diabetes Sister   . Stroke Sister   . Cancer Paternal Uncle   . Cancer Cousin   . Heart failure Father     . Heart disease Father   . Arthritis Father   . Sarcoidosis Sister    Family Status  Relation Status  . Brother Deceased at age 73       MI, ETOH abuse  . Brother Deceased       CVA  . Mother Deceased at age 83       CVA  . Sister Deceased at age 62       liver disease  . Sister Deceased at age 16  lung cancer  . Sister Deceased  . Annamarie Major Deceased  . Cousin Other  . Father Deceased at age 78       dementia  . Sister Alive  . Brother Deceased       MVA  . Daughter Alive  . Son Alive     Allergies  Allergen Reactions  . Lisinopril Anaphylaxis and Swelling    Swelling of tongue and mouth 11/05/16- tolerates Olmesartan  . Pepcid [Famotidine] Other (See Comments)    PPI H2, BLOCKERS LOWER GASTRIC PH WHICH WOULD LEAD TO SUBTHERAPEUTIC RILPIVIRINE LEVELS AND POTENTIAL VIROLOGICAL FAILURE WITH RESISTANCE  . Prilosec [Omeprazole] Other (See Comments)    PPI H2, BLOCKERS LOWER GASTRIC PH WHICH WOULD LEAD TO SUBTHERAPEUTIC RILPIVIRINE LEVELS AND POTENTIAL VIROLOGICAL FAILURE WITH RESISTANCE  . Tums [Calcium Carbonate Antacid] Other (See Comments)    TUMS ANTACIDS CAN LOWER GASTRIC PH WHICH COULD  LEAD TO SUBTHERAPEUTIC RILPIVIRINE LEVELS AND POTENTIAL VIROLOGICAL FAILURE WITH RESISTANCE TUMS CAN BE GIVEN BUT NEED CONSULT WITH ID PHARMACY RE TIMING. I PREFER HER TO AVOID ALL TOGETHER    Medications: Patient's Medications  New Prescriptions   No medications on file  Previous Medications   CALCIUM-VITAMIN D PO    Take 1 tablet by mouth daily.   CHOLECALCIFEROL (VITAMIN D3) 2000 UNITS TABS    Take 2,000 Units by mouth daily with lunch.   DARUNAVIR-COBICISTAT (PREZCOBIX) 800-150 MG TABLET    TAKE 1 TABLET BY MOUTH DAILY. SWALLOW WHOLE. DO NOT CRUSH, BREAK OR CHEW TABLETS. TAKE WITH FOOD.   DOLUTEGRAVIR-RILPIVIRINE (JULUCA) 50-25 MG TABS    Take 1 tablet by mouth daily with breakfast. Take with the Prezcobix.   HYDROCODONE-ACETAMINOPHEN (NORCO/VICODIN) 5-325 MG TABLET     Take 1 tablet by mouth every 6 (six) hours as needed for moderate pain.   LEVOTHYROXINE (SYNTHROID, LEVOTHROID) 175 MCG TABLET    Take 1 tablet (175 mcg total) by mouth daily before breakfast.   LORATADINE (CLARITIN) 10 MG TABLET    Take 10 mg by mouth daily. Reported on 06/08/2015   METHOCARBAMOL (ROBAXIN) 500 MG TABLET    Take 500 mg by mouth every 8 (eight) hours as needed for muscle spasms.   MULTIPLE VITAMINS-MINERALS (HAIR/SKIN/NAILS) TABS    Take 1 tablet by mouth daily.   OLMESARTAN-HYDROCHLOROTHIAZIDE (BENICAR HCT) 40-25 MG TABLET    Take 1 tablet by mouth daily.   OMEGA-3 FATTY ACIDS (FISH OIL PO)    Take 1 capsule by mouth daily with lunch.   PITAVASTATIN CALCIUM 4 MG TABS    Take 1 tablet (4 mg total) by mouth daily. This may be placebo, study provided. Do not dispense.   PROBIOTIC PRODUCT (PROBIOTIC PO)    Take 1 capsule by mouth daily with lunch.  Modified Medications   No medications on file  Discontinued Medications   GABAPENTIN (NEURONTIN) 300 MG CAPSULE    Take 1 capsule (300 mg total) by mouth 4 (four) times daily.   SULFAMETHOXAZOLE-TRIMETHOPRIM (BACTRIM DS,SEPTRA DS) 800-160 MG TABLET    Take 1 tablet by mouth daily.    Review of Systems  Musculoskeletal: Positive for arthralgias.  Neurological: Positive for numbness.  All other systems reviewed and are negative.   Vitals:   04/24/17 1152  BP: 126/84  Pulse: 76  Resp: 16  Temp: 97.8 F (36.6 C)  TempSrc: Oral  SpO2: 98%  Weight: 253 lb 12.8 oz (115.1 kg)  Height: '5\' 9"'  (1.753 m)   Body mass index is 37.48 kg/m.  Physical Exam  Constitutional: She is oriented to person, place, and time. She appears well-developed and well-nourished.  HENT:  Mouth/Throat: Oropharynx is clear and moist. No oropharyngeal exudate.  MMM; no oral thrush  Eyes: Pupils are equal, round, and reactive to light. No scleral icterus.  Neck: Neck supple. Carotid bruit is not present. No tracheal deviation present. No thyromegaly  present.  Cardiovascular: Normal rate, regular rhythm and intact distal pulses.  Exam reveals no gallop and no friction rub.   Murmur (1/6 SEM) heard. No LE edema b/l. no calf TTP.   Pulmonary/Chest: Effort normal and breath sounds normal. No stridor. No respiratory distress. She has no wheezes. She has no rales. Breasts are asymmetrical (right mastectomy ).  Abdominal: Soft. Normal appearance and bowel sounds are normal. She exhibits no distension and no mass. There is no hepatomegaly. There is no tenderness. There is no rigidity, no rebound and no guarding. No hernia.  Musculoskeletal: She exhibits edema.  Lymphadenopathy:    She has no cervical adenopathy.  Neurological: She is alert and oriented to person, place, and time. She has normal reflexes.  Skin: Skin is warm and dry. No rash noted.  Psychiatric: She has a normal mood and affect. Her behavior is normal. Judgment and thought content normal.     Labs reviewed: Hospital Outpatient Visit on 03/26/2017  Component Date Value Ref Range Status  . Sodium 03/26/2017 138  135 - 145 mmol/L Final  . Potassium 03/26/2017 3.7  3.5 - 5.1 mmol/L Final  . Chloride 03/26/2017 103  101 - 111 mmol/L Final  . CO2 03/26/2017 28  22 - 32 mmol/L Final  . Glucose, Bld 03/26/2017 84  65 - 99 mg/dL Final  . BUN 03/26/2017 11  6 - 20 mg/dL Final  . Creatinine, Ser 03/26/2017 1.43* 0.44 - 1.00 mg/dL Final  . Calcium 03/26/2017 9.8  8.9 - 10.3 mg/dL Final  . GFR calc non Af Amer 03/26/2017 39* >60 mL/min Final  . GFR calc Af Amer 03/26/2017 45* >60 mL/min Final   Comment: (NOTE) The eGFR has been calculated using the CKD EPI equation. This calculation has not been validated in all clinical situations. eGFR's persistently <60 mL/min signify possible Chronic Kidney Disease.   . Anion gap 03/26/2017 7  5 - 15 Final  . WBC 03/26/2017 8.1  4.0 - 10.5 K/uL Final  . RBC 03/26/2017 2.26* 3.87 - 5.11 MIL/uL Final  . Hemoglobin 03/26/2017 8.4* 12.0 - 15.0  g/dL Final  . HCT 03/26/2017 26.3* 36.0 - 46.0 % Final  . MCV 03/26/2017 116.4* 78.0 - 100.0 fL Final  . MCH 03/26/2017 37.2* 26.0 - 34.0 pg Final  . MCHC 03/26/2017 31.9  30.0 - 36.0 g/dL Final  . RDW 03/26/2017 18.9* 11.5 - 15.5 % Final  . Platelets 03/26/2017 183  150 - 400 K/uL Final  Appointment on 03/20/2017  Component Date Value Ref Range Status  . Cholesterol 03/20/2017 133  <200 mg/dL Final  . HDL 03/20/2017 59  >50 mg/dL Final  . Triglycerides 03/20/2017 111  <150 mg/dL Final  . LDL Cholesterol (Calc) 03/20/2017 55  mg/dL (calc) Final   Comment: Reference range: <100 . Desirable range <100 mg/dL for primary prevention;   <70 mg/dL for patients with CHD or diabetic patients  with > or = 2 CHD risk factors. Marland Kitchen LDL-C is now calculated using the Martin-Hopkins  calculation, which is a validated novel method providing  better accuracy than the Friedewald equation in the  estimation of  LDL-C.  Cresenciano Genre et al. Annamaria Helling. 7062;376(28): 2061-2068  (http://education.[employer].com/faq/FAQ164)   . Total CHOL/HDL Ratio 03/20/2017 2.3  <5.0 (calc) Final  . Non-HDL Cholesterol (Calc) 03/20/2017 74  <130 mg/dL (calc) Final   Comment: For patients with diabetes plus 1 major ASCVD risk  factor, treating to a non-HDL-C goal of <100 mg/dL  (LDL-C of <70 mg/dL) is considered a therapeutic  option.   . Glucose, Bld 03/20/2017 66  65 - 99 mg/dL Final   Comment: .            Fasting reference interval .   . BUN 03/20/2017 20  7 - 25 mg/dL Final  . Creat 03/20/2017 1.36* 0.50 - 0.99 mg/dL Final   Comment: For patients >20 years of age, the reference limit for Creatinine is approximately 13% higher for people identified as African-American. .   . GFR, Est Non African American 03/20/2017 42* > OR = 60 mL/min/1.22m Final  . GFR, Est African American 03/20/2017 49* > OR = 60 mL/min/1.789mFinal  . BUN/Creatinine Ratio 03/20/2017 15  6 - 22 (calc) Final  . Sodium 03/20/2017 140  135 -  146 mmol/L Final  . Potassium 03/20/2017 4.0  3.5 - 5.3 mmol/L Final  . Chloride 03/20/2017 103  98 - 110 mmol/L Final  . CO2 03/20/2017 29  20 - 32 mmol/L Final  . Calcium 03/20/2017 9.2  8.6 - 10.4 mg/dL Final  . Total Protein 03/20/2017 6.4  6.1 - 8.1 g/dL Final  . Albumin 03/20/2017 3.7  3.6 - 5.1 g/dL Final  . Globulin 03/20/2017 2.7  1.9 - 3.7 g/dL (calc) Final  . AG Ratio 03/20/2017 1.4  1.0 - 2.5 (calc) Final  . Total Bilirubin 03/20/2017 0.2  0.2 - 1.2 mg/dL Final  . Alkaline phosphatase (APISO) 03/20/2017 145* 33 - 130 U/L Final  . AST 03/20/2017 15  10 - 35 U/L Final  . ALT 03/20/2017 15  6 - 29 U/L Final  . WBC 03/20/2017 11.2* 3.8 - 10.8 Thousand/uL Final  . RBC 03/20/2017 2.04* 3.80 - 5.10 Million/uL Final  . Hemoglobin 03/20/2017 7.6* 11.7 - 15.5 g/dL Final  . HCT 03/20/2017 22.8* 35.0 - 45.0 % Final  . MCV 03/20/2017 111.8* 80.0 - 100.0 fL Final  . MCH 03/20/2017 37.3* 27.0 - 33.0 pg Final  . MCHC 03/20/2017 33.3  32.0 - 36.0 g/dL Final  . RDW 03/20/2017 15.7* 11.0 - 15.0 % Final  . Platelets 03/20/2017 26* 140 - 400 Thousand/uL Final   Comment: Verified by repeat analysis. . Marland Kitchen . MPV 03/20/2017 12.5  7.5 - 12.5 fL Final  . Neutro Abs 03/20/2017 8120* 1,500 - 7,800 cells/uL Final  . Lymphs Abs 03/20/2017 1971  850 - 3,900 cells/uL Final  . WBC mixed population 03/20/2017 1053* 200 - 950 cells/uL Final  . Eosinophils Absolute 03/20/2017 45  15 - 500 cells/uL Final  . Basophils Absolute 03/20/2017 11  0 - 200 cells/uL Final  . Neutrophils Relative % 03/20/2017 72.5  % Final  . Total Lymphocyte 03/20/2017 17.6  % Final  . Monocytes Relative 03/20/2017 9.4  % Final  . Eosinophils Relative 03/20/2017 0.4  % Final  . Basophils Relative 03/20/2017 0.1  % Final  . Smear Review 03/20/2017    Final   Comment: No platelet clumps seen. Review of peripheral smear confirms automated results.   Appointment on 03/11/2017  Component Date Value Ref Range Status  . WBC  03/11/2017 6.1  3.9 - 10.3 10e3/uL Final  .  NEUT# 03/11/2017 3.0  1.5 - 6.5 10e3/uL Final  . HGB 03/11/2017 8.5* 11.6 - 15.9 g/dL Final  . HCT 03/11/2017 25.4* 34.8 - 46.6 % Final  . Platelets 03/11/2017 171  145 - 400 10e3/uL Final  . MCV 03/11/2017 113.7* 79.5 - 101.0 fL Final  . MCH 03/11/2017 38.1* 25.1 - 34.0 pg Final  . MCHC 03/11/2017 33.5  31.5 - 36.0 g/dL Final  . RBC 03/11/2017 2.23* 3.70 - 5.45 10e6/uL Final  . RDW 03/11/2017 20.1* 11.2 - 14.5 % Final  . lymph# 03/11/2017 2.0  0.9 - 3.3 10e3/uL Final  . MONO# 03/11/2017 1.0* 0.1 - 0.9 10e3/uL Final  . Eosinophils Absolute 03/11/2017 0.0  0.0 - 0.5 10e3/uL Final  . Basophils Absolute 03/11/2017 0.0  0.0 - 0.1 10e3/uL Final  . NEUT% 03/11/2017 49.1  38.4 - 76.8 % Final  . LYMPH% 03/11/2017 32.8  14.0 - 49.7 % Final  . MONO% 03/11/2017 16.8* 0.0 - 14.0 % Final  . EOS% 03/11/2017 0.7  0.0 - 7.0 % Final  . BASO% 03/11/2017 0.6  0.0 - 2.0 % Final  . Sodium 03/11/2017 140  136 - 145 mEq/L Final  . Potassium 03/11/2017 4.4  3.5 - 5.1 mEq/L Final  . Chloride 03/11/2017 105  98 - 109 mEq/L Final  . CO2 03/11/2017 28  22 - 29 mEq/L Final  . Glucose 03/11/2017 79  70 - 140 mg/dl Final   Glucose reference range is for nonfasting patients. Fasting glucose reference range is 70- 100.  Marland Kitchen BUN 03/11/2017 17.2  7.0 - 26.0 mg/dL Final  . Creatinine 03/11/2017 1.4* 0.6 - 1.1 mg/dL Final  . Total Bilirubin 03/11/2017 <0.22  0.20 - 1.20 mg/dL Final  . Alkaline Phosphatase 03/11/2017 123  40 - 150 U/L Final  . AST 03/11/2017 14  5 - 34 U/L Final  . ALT 03/11/2017 15  0 - 55 U/L Final  . Total Protein 03/11/2017 6.9  6.4 - 8.3 g/dL Final  . Albumin 03/11/2017 3.4* 3.5 - 5.0 g/dL Final  . Calcium 03/11/2017 10.0  8.4 - 10.4 mg/dL Final  . Anion Gap 03/11/2017 6  3 - 11 mEq/L Final  . EGFR 03/11/2017 48* >90 ml/min/1.73 m2 Final   eGFR is calculated using the CKD-EPI Creatinine Equation (2009)  Appointment on 03/07/2017  Component Date Value  Ref Range Status  . TSH 03/07/2017 2.45  0.35 - 4.50 uIU/mL Final  Hospital Outpatient Visit on 03/04/2017  Component Date Value Ref Range Status  . CD4 T Cell Abs 03/04/2017 610  400 - 2,700 /uL Final  . CD4 % Helper T Cell 03/04/2017 41  33 - 55 % Final  Appointment on 03/04/2017  Component Date Value Ref Range Status  . Sodium 03/04/2017 141  136 - 145 mEq/L Final  . Potassium 03/04/2017 4.2  3.5 - 5.1 mEq/L Final  . Chloride 03/04/2017 106  98 - 109 mEq/L Final  . CO2 03/04/2017 28  22 - 29 mEq/L Final  . Glucose 03/04/2017 90  70 - 140 mg/dl Final   Glucose reference range is for nonfasting patients. Fasting glucose reference range is 70- 100.  Marland Kitchen BUN 03/04/2017 10.2  7.0 - 26.0 mg/dL Final  . Creatinine 03/04/2017 1.3* 0.6 - 1.1 mg/dL Final  . Total Bilirubin 03/04/2017 0.28  0.20 - 1.20 mg/dL Final  . Alkaline Phosphatase 03/04/2017 114  40 - 150 U/L Final  . AST 03/04/2017 22  5 - 34 U/L Final  . ALT 03/04/2017  23  0 - 55 U/L Final  . Total Protein 03/04/2017 7.4  6.4 - 8.3 g/dL Final  . Albumin 03/04/2017 3.7  3.5 - 5.0 g/dL Final  . Calcium 03/04/2017 10.5* 8.4 - 10.4 mg/dL Final  . Anion Gap 03/04/2017 7  3 - 11 mEq/L Final  . EGFR 03/04/2017 52* >90 ml/min/1.73 m2 Final   eGFR is calculated using the CKD-EPI Creatinine Equation (2009)  . CD4 03/04/2017 See Separate Report   Final  . WBC 03/04/2017 6.8  3.9 - 10.3 10e3/uL Final  . NEUT# 03/04/2017 4.6  1.5 - 6.5 10e3/uL Final  . HGB 03/04/2017 9.1* 11.6 - 15.9 g/dL Final  . HCT 03/04/2017 28.2* 34.8 - 46.6 % Final  . Platelets 03/04/2017 152  145 - 400 10e3/uL Final  . MCV 03/04/2017 112.8* 79.5 - 101.0 fL Final  . MCH 03/04/2017 36.4* 25.1 - 34.0 pg Final  . MCHC 03/04/2017 32.3  31.5 - 36.0 g/dL Final  . RBC 03/04/2017 2.50* 3.70 - 5.45 10e6/uL Final  . RDW 03/04/2017 22.1* 11.2 - 14.5 % Final  . lymph# 03/04/2017 1.5  0.9 - 3.3 10e3/uL Final  . MONO# 03/04/2017 0.7  0.1 - 0.9 10e3/uL Final  . Eosinophils Absolute  03/04/2017 0.1  0.0 - 0.5 10e3/uL Final  . Basophils Absolute 03/04/2017 0.0  0.0 - 0.1 10e3/uL Final  . NEUT% 03/04/2017 67.3  38.4 - 76.8 % Final  . LYMPH% 03/04/2017 22.1  14.0 - 49.7 % Final  . MONO% 03/04/2017 9.8  0.0 - 14.0 % Final  . EOS% 03/04/2017 0.7  0.0 - 7.0 % Final  . BASO% 03/04/2017 0.1  0.0 - 2.0 % Final  Hospital Outpatient Visit on 02/18/2017  Component Date Value Ref Range Status  . CD4 T Cell Abs 02/18/2017 1200  400 - 2,700 /uL Final  . CD4 % Helper T Cell 02/18/2017 45  33 - 55 % Final  Appointment on 02/18/2017  Component Date Value Ref Range Status  . CD4 02/18/2017 See Separate Report   Final  . Sodium 02/18/2017 140  136 - 145 mEq/L Final  . Potassium 02/18/2017 4.6  3.5 - 5.1 mEq/L Final  . Chloride 02/18/2017 105  98 - 109 mEq/L Final  . CO2 02/18/2017 29  22 - 29 mEq/L Final  . Glucose 02/18/2017 87  70 - 140 mg/dl Final   Glucose reference range is for nonfasting patients. Fasting glucose reference range is 70- 100.  Marland Kitchen BUN 02/18/2017 16.9  7.0 - 26.0 mg/dL Final  . Creatinine 02/18/2017 1.5* 0.6 - 1.1 mg/dL Final  . Total Bilirubin 02/18/2017 0.32  0.20 - 1.20 mg/dL Final  . Alkaline Phosphatase 02/18/2017 108  40 - 150 U/L Final  . AST 02/18/2017 21  5 - 34 U/L Final  . ALT 02/18/2017 24  0 - 55 U/L Final  . Total Protein 02/18/2017 7.4  6.4 - 8.3 g/dL Final  . Albumin 02/18/2017 3.5  3.5 - 5.0 g/dL Final  . Calcium 02/18/2017 10.7* 8.4 - 10.4 mg/dL Final  . Anion Gap 02/18/2017 6  3 - 11 mEq/L Final  . EGFR 02/18/2017 44* >90 ml/min/1.73 m2 Final   eGFR is calculated using the CKD-EPI Creatinine Equation (2009)  . WBC 02/18/2017 7.2  3.9 - 10.3 10e3/uL Final  . NEUT# 02/18/2017 3.3  1.5 - 6.5 10e3/uL Final  . HGB 02/18/2017 9.4* 11.6 - 15.9 g/dL Final  . HCT 02/18/2017 28.7* 34.8 - 46.6 % Final  . Platelets 02/18/2017  149  145 - 400 10e3/uL Final  . MCV 02/18/2017 104.0* 79.5 - 101.0 fL Final  . MCH 02/18/2017 34.1* 25.1 - 34.0 pg Final  . MCHC  02/18/2017 32.8  31.5 - 36.0 g/dL Final  . RBC 02/18/2017 2.76* 3.70 - 5.45 10e6/uL Final  . RDW 02/18/2017 20.3* 11.2 - 14.5 % Final  . lymph# 02/18/2017 2.6  0.9 - 3.3 10e3/uL Final  . MONO# 02/18/2017 1.2* 0.1 - 0.9 10e3/uL Final  . Eosinophils Absolute 02/18/2017 0.1  0.0 - 0.5 10e3/uL Final  . Basophils Absolute 02/18/2017 0.1  0.0 - 0.1 10e3/uL Final  . NEUT% 02/18/2017 45.8  38.4 - 76.8 % Final  . LYMPH% 02/18/2017 35.6  14.0 - 49.7 % Final  . MONO% 02/18/2017 16.9* 0.0 - 14.0 % Final  . EOS% 02/18/2017 1.0  0.0 - 7.0 % Final  . BASO% 02/18/2017 0.7  0.0 - 2.0 % Final  Appointment on 02/11/2017  Component Date Value Ref Range Status  . WBC 02/11/2017 14.0* 3.9 - 10.3 10e3/uL Final  . NEUT# 02/11/2017 10.5* 1.5 - 6.5 10e3/uL Final  . HGB 02/11/2017 9.7* 11.6 - 15.9 g/dL Final  . HCT 02/11/2017 30.0* 34.8 - 46.6 % Final  . Platelets 02/11/2017 99* 145 - 400 10e3/uL Final  . MCV 02/11/2017 104.5* 79.5 - 101.0 fL Final  . MCH 02/11/2017 33.8  25.1 - 34.0 pg Final  . MCHC 02/11/2017 32.3  31.5 - 36.0 g/dL Final  . RBC 02/11/2017 2.87* 3.70 - 5.45 10e6/uL Final  . RDW 02/11/2017 21.1* 11.2 - 14.5 % Final  . lymph# 02/11/2017 2.0  0.9 - 3.3 10e3/uL Final  . MONO# 02/11/2017 1.4* 0.1 - 0.9 10e3/uL Final  . Eosinophils Absolute 02/11/2017 0.0  0.0 - 0.5 10e3/uL Final  . Basophils Absolute 02/11/2017 0.0  0.0 - 0.1 10e3/uL Final  . NEUT% 02/11/2017 75.2  38.4 - 76.8 % Final  . LYMPH% 02/11/2017 13.9* 14.0 - 49.7 % Final  . MONO% 02/11/2017 10.3  0.0 - 14.0 % Final  . EOS% 02/11/2017 0.3  0.0 - 7.0 % Final  . BASO% 02/11/2017 0.3  0.0 - 2.0 % Final  . Sodium 02/11/2017 141  136 - 145 mEq/L Final  . Potassium 02/11/2017 4.0  3.5 - 5.1 mEq/L Final  . Chloride 02/11/2017 105  98 - 109 mEq/L Final  . CO2 02/11/2017 29  22 - 29 mEq/L Final  . Glucose 02/11/2017 99  70 - 140 mg/dl Final   Glucose reference range is for nonfasting patients. Fasting glucose reference range is 70- 100.  Marland Kitchen  BUN 02/11/2017 14.9  7.0 - 26.0 mg/dL Final  . Creatinine 02/11/2017 1.6* 0.6 - 1.1 mg/dL Final  . Total Bilirubin 02/11/2017 0.27  0.20 - 1.20 mg/dL Final  . Alkaline Phosphatase 02/11/2017 127  40 - 150 U/L Final  . AST 02/11/2017 19  5 - 34 U/L Final  . ALT 02/11/2017 19  0 - 55 U/L Final  . Total Protein 02/11/2017 7.1  6.4 - 8.3 g/dL Final  . Albumin 02/11/2017 3.5  3.5 - 5.0 g/dL Final  . Calcium 02/11/2017 10.3  8.4 - 10.4 mg/dL Final  . Anion Gap 02/11/2017 6  3 - 11 mEq/L Final  . EGFR 02/11/2017 41* >90 ml/min/1.73 m2 Final   eGFR is calculated using the CKD-EPI Creatinine Equation (2009)  Hospital Outpatient Visit on 01/28/2017  Component Date Value Ref Range Status  . CD4 T Cell Abs 01/28/2017 1240  400 - 2,700 /  uL Final  . CD4 % Helper T Cell 01/28/2017 39  33 - 55 % Final  There may be more visits with results that are not included.    No results found.   Assessment/Plan   ICD-10-CM   1. Thyromegaly E01.0 US THYROID  2. 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 Essential hypertension, benign I10   4. CKD (chronic kidney disease) stage 3, GFR 30-59 ml/min (HCC) N18.3   5. Recurrent cancer of right breast ([place]) C50.911   6. Human immunodeficiency virus (HIV) disease ([place]) B20    Continue current medications as ordered  Follow up with specialists as scheduled  Follow up in 6 mos for HTN, hyperlipidemia, breast cancer and CKD with Gaspar Cola S. Perlie Gold  [facility] [address] [place], [place] [address] [phone] Cell (Monday-Friday 8 AM - 5 PM) [phone] After 5 PM and follow prompts

## 2017-05-01 MED FILL — JULUCA 50-25 MG TAB: 50-25 | 30 days supply | Qty: 30 | Fill #5

## 2017-05-01 MED FILL — PREZCOBIX 800 MG-150 MG TAB: 800-150 | 30 days supply | Qty: 30 | Fill #9

## 2017-05-08 NOTE — Progress Notes (Signed)
CLINIC:  Survivorship   REASON FOR VISIT:  Routine follow-up post-treatment for a recent history of breast cancer.  BRIEF ONCOLOGIC HISTORY:    Malignant neoplasm of upper-outer quadrant of right breast in female, estrogen receptor negative ([place])   03/2004 Surgery    Right lumpectomy and SLNB for IDC, 0.5cm, 1/2 + SLN, grade 3, ER-, PR-, HER-2 -.  Treated with Doxorubicin and Cyclophosphamide x 4, followed by weekly Paclitaxel x 7 and adjuvant radiation.       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 Lymph node: Positive      11/05/2016 Surgery    Right modified radical mastectomy: IDC, grade 3, 2.6 cm, +LVI, triple negative, margins negative, 2/10 LN + metastases.  T2, N1a      11/20/2016 - 03/11/2017 Adjuvant Chemotherapy    Gemcitabine/Carboplatin given on days 1 and 8 on a 21 day cycle x 6 cycles       INTERVAL HISTORY:  Kayla Price presents to the [facility] today for our initial meeting to review her survivorship care plan detailing her treatment course for breast cancer, as well as monitoring long-term side effects of that treatment, education regarding health maintenance, screening, and overall wellness and health promotion.     Overall, Kayla Price reports feeling quite well.  She has a mild URI that she is suffering from, but all in all feels well since completing chemotherapy.  She exercises at the [facility] and sees her PCP regularly.      REVIEW OF SYSTEMS:  Review of Systems  Constitutional: Negative for appetite change, chills, fatigue, fever and unexpected weight change.  HENT:   Negative for hearing loss and lump/mass.   Eyes: Negative for eye problems and icterus.  Respiratory: Negative for chest tightness, cough and shortness of breath.   Cardiovascular: Negative for chest pain, leg swelling and palpitations.  Gastrointestinal: Negative for abdominal distention and abdominal pain.    Endocrine: Negative for hot flashes.  Genitourinary: Negative for difficulty urinating.   Musculoskeletal: Negative for arthralgias.  Skin: Negative for itching and rash.  Neurological: Negative for dizziness, extremity weakness and headaches.  Hematological: Negative for adenopathy. Does not bruise/bleed easily.  Psychiatric/Behavioral: Negative for depression. The patient is not nervous/anxious.   Breast: Denies any new nodularity, masses, tenderness, nipple changes, or nipple discharge.      ONCOLOGY TREATMENT TEAM:  1. Surgeon:  Dr. Dalbert Batman at [facility] 2. Medical Oncologist: Dr. Jana Hakim      PAST MEDICAL/SURGICAL HISTORY:  Past Medical History:  Diagnosis Date  . Alopecia areata 11/28/2009  . Bell's palsy   . BREAST CANCER, HX OF 2005  . Cancer [facility]) 2005   Breast cancer   chemotherapy and radiation  . CKD (chronic kidney disease) stage 3, GFR 30-59 ml/min (HCC) 06/08/2015  . Dry eye syndrome   . Fasting hyperglycemia   . HIP FRACTURE, RIGHT 05/06/2008  . History of kidney stones   . HIV (human immunodeficiency virus infection) ([place])   . HIV DISEASE 03/27/2006  . HYPERLIPIDEMIA, MIXED 12/15/2007  . HYPERTENSION 03/27/2006  . 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 MENORRHAGIA, POSTMENOPAUSAL 02/03/2009  . Osteoarthritis of left knee 06/08/2015  . OSTEOARTHROSIS, LOCAL, SCND, UNSPC SITE 04/07/2007  . PVD 04/07/2007  . Tinea capitis   . TRIGGER FINGER 05/06/2008  . Unspecified vitamin D deficiency 08/06/2007   Past Surgical History:  Procedure Laterality Date  . BREAST LUMPECTOMY Right  2005  . COLONOSCOPY    . COLONOSCOPY WITH ESOPHAGOGASTRODUODENOSCOPY (EGD)    . HYSTEROSCOPY  2006  . INSERTION PORT-A-CATH WITH ULTRA SOUND Right 11/05/2016   Performed by Fanny Skates, MD at [place]  . KNEE ARTHROSCOPY Left 2002  . LYMPH NODE DISSECTION  2005  . MODIFIED RADICAL MASTECTOMY Right 11/05/2016  . placement   of port-a-cath    . PORT-A-CATH  REMOVAL  2006   insertion 2005  . removal of port a cath  12/2014  . REMOVAL PORT-A-CATH N/A 03/29/2017   Performed by Fanny Skates, MD at [place] Right 11/05/2016   Performed by Fanny Skates, MD at [place]  2009   Ablation   . TOTAL KNEE ARTHROPLASTY Left 02/06/2016   Performed by Frederik Pear, MD at [place]  . TUBAL LIGATION       ALLERGIES:  Allergies  Allergen Reactions  . Lisinopril Anaphylaxis and Swelling    Swelling of tongue and mouth 11/05/16- tolerates Olmesartan  . Pepcid [Famotidine] Other (See Comments)    PPI H2, BLOCKERS LOWER GASTRIC PH WHICH WOULD LEAD TO SUBTHERAPEUTIC RILPIVIRINE LEVELS AND POTENTIAL VIROLOGICAL FAILURE WITH RESISTANCE  . Prilosec [Omeprazole] Other (See Comments)    PPI H2, BLOCKERS LOWER GASTRIC PH WHICH WOULD LEAD TO SUBTHERAPEUTIC RILPIVIRINE LEVELS AND POTENTIAL VIROLOGICAL FAILURE WITH RESISTANCE  . Tums [Calcium Carbonate Antacid] Other (See Comments)    TUMS ANTACIDS CAN LOWER GASTRIC PH WHICH COULD  LEAD TO SUBTHERAPEUTIC RILPIVIRINE LEVELS AND POTENTIAL VIROLOGICAL FAILURE WITH RESISTANCE TUMS CAN BE GIVEN BUT NEED CONSULT WITH [facility] PHARMACY RE TIMING. I PREFER HER TO AVOID ALL TOGETHER     CURRENT MEDICATIONS:  Outpatient Encounter Medications as of 05/13/2017  Medication Sig  . CALCIUM-VITAMIN D PO Take 1 tablet by mouth daily.  . Cholecalciferol (VITAMIN D3) 2000 units TABS Take 2,000 Units by mouth daily with lunch.  . darunavir-cobicistat (PREZCOBIX) 800-150 MG tablet TAKE 1 TABLET BY MOUTH DAILY. SWALLOW WHOLE. DO NOT CRUSH, BREAK OR CHEW TABLETS. TAKE WITH FOOD.  . Dolutegravir-Rilpivirine (JULUCA) 50-25 MG TABS Take 1 tablet by mouth daily with breakfast. Take with the Prezcobix.  Marland Kitchen HYDROcodone-acetaminophen (NORCO/VICODIN) 5-325 MG tablet Take 1 tablet by mouth every 6 (six) hours as needed for moderate pain.  Marland Kitchen levothyroxine (SYNTHROID, LEVOTHROID) 175 MCG tablet Take 1  tablet (175 mcg total) by mouth daily before breakfast.  . loratadine (CLARITIN) 10 MG tablet Take 10 mg by mouth daily. Reported on 06/08/2015  . methocarbamol (ROBAXIN) 500 MG tablet Take 500 mg by mouth every 8 (eight) hours as needed for muscle spasms.  . Multiple Vitamins-Minerals (HAIR/SKIN/NAILS) TABS Take 1 tablet by mouth daily.  Marland Kitchen olmesartan-hydrochlorothiazide (BENICAR HCT) 40-25 MG tablet Take 1 tablet by mouth daily.  . Omega-3 Fatty Acids (FISH OIL PO) Take 1 capsule by mouth daily with lunch.  . Pitavastatin Calcium 4 MG TABS Take 1 tablet (4 mg total) by mouth daily. This may be placebo, study provided. Do not dispense.  . Probiotic Product (PROBIOTIC PO) Take 1 capsule by mouth daily with lunch.  . sulfamethoxazole-trimethoprim (BACTRIM DS,SEPTRA DS) 800-160 MG tablet Take 1 tablet daily by mouth.   No facility-administered encounter medications on file as of 05/13/2017.      ONCOLOGIC FAMILY HISTORY:  Family History  Problem Relation Age of Onset  . Heart attack Brother        Massive MI in 67s  . Stroke Brother  CAD  . Kidney disease Mother   . Stroke Mother   . Diabetes Mother   . Liver disease Sister   . COPD Sister        had I-131 rx of hyperthyroidism  . Diabetes Sister   . Stroke Sister   . Cancer Paternal Uncle   . Cancer Cousin   . Heart failure Father   . Heart disease Father   . Arthritis Father   . Sarcoidosis Sister      GENETIC COUNSELING/TESTING: Indicated, needs referral  SOCIAL HISTORY:  RAEJEAN Price is widowed and lives in [place], [place].  She has 2 children and they live in [place].  Kayla Price is currently retired, but will be working over the holidays as a retiree at [employer].  She denies any current or history of tobacco, alcohol, or illicit drug use.     PHYSICAL EXAMINATION:  Vital Signs:   Vitals:   05/13/17 0937  BP: 120/68  Pulse: 74  Resp: 18  Temp: 97.7 F (36.5 C)  SpO2: 100%    Filed Weights   05/13/17 0937  Weight: 257 lb 9.6 oz (116.8 kg)   General: Well-nourished, well-appearing female in no acute distress.  She is unaccompanied today.   HEENT: Head is normocephalic.  Pupils equal and reactive to light. Conjunctivae clear without exudate.  Sclerae anicteric. Oral mucosa is pink, moist.  Oropharynx is pink without lesions or erythema.  Lymph: No cervical, supraclavicular, or infraclavicular lymphadenopathy noted on palpation.  Cardiovascular: Regular rate and rhythm.Marland Kitchen Respiratory: Clear to auscultation bilaterally. Chest expansion symmetric; breathing non-labored.  Breasts: Right breast is surgically absent, no nodules or masses at the site, left breast without nodules, masses, skin or nipple changes, benign breast exam.  GI: Abdomen soft and round; non-tender, non-distended. Bowel sounds normoactive.  GU: Deferred.  Neuro: No focal deficits. Steady gait.  Psych: Mood and affect normal and appropriate for situation.  Extremities: No edema. MSK: No focal spinal tenderness to palpation.  Full range of motion in bilateral upper extremities Skin: Warm and dry.  LABORATORY DATA:  None for this visit.  DIAGNOSTIC IMAGING:  None for this visit.      ASSESSMENT AND PLAN:  Kayla Price is a pleasant 61 y.o. 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  She presents to the Survivorship Clinic for our initial meeting and routine follow-up post-completion of treatment for breast cancer.    1. Stage IIIA right breast cancer:  Kayla Price is continuing to recover from definitive treatment for breast cancer. She will follow-up with her medical oncologist, Dr. Jana Hakim in 6 months with history and physical exam per surveillance protocol.   Today, a comprehensive survivorship care plan and treatment summary was reviewed with the patient today detailing her breast cancer  diagnosis, treatment course, potential late/long-term effects of treatment, appropriate follow-up care with recommendations for the future, and patient education resources.  A copy of this summary, along with a letter will be sent to the patient's primary care provider via mail/fax/In Basket message after today's visit.    2. Left iliac enlarged lymph node and lung nodule: will repeat CT chest abdomen and pelvis to ensure stability of these issues from her CT scan she had when she was initially diagnosed.    3. Genetic testing: Kayla Price was originally diagnosed with triple negative breast cancer in 2005, when she was well under 60 and then again  thirteen years later, at which time she was 63.  This is an indication for genetic testing, and I cannot see where she has had this done.  I referred her today and reviewed with her.  She is agreeable.    4. Bone health:  Given Kayla Price's age/history of breast cancer, she is at risk for bone demineralization.  I will defer to her pcp regarding bone density testing and management.  She was given education on specific activities to promote bone health.  5. Cancer screening:  Due to Kayla Price's history and her age, she should receive screening for skin cancers, colon cancer, and gynecologic cancers.  The information and recommendations are listed on the patient's comprehensive care plan/treatment summary and were reviewed in detail with the patient.    6. Health maintenance and wellness promotion: Kayla Price was encouraged to consume 5-7 servings of fruits and vegetables per day. We reviewed the "Nutrition Rainbow" handout, as well as the handout "Take Control of Your Health and Reduce Your Cancer Risk" from the [place].  She was also encouraged to engage in moderate to vigorous exercise for 30 minutes per day most days of the week. We discussed the [employer] fitness program, which is designed for cancer survivors to help them become more  physically fit after cancer treatments.  She was instructed to limit her alcohol consumption and continue to abstain from tobacco use.     6. Support services/counseling: It is not uncommon for this period of the patient's cancer care trajectory to be one of many emotions and stressors.  We discussed an opportunity for her to participate in the next session of [facility] ("Finding Your New Normal") support group series designed for patients after they have completed treatment.   Kayla Price was encouraged to take advantage of our many other support services programs, support groups, and/or counseling in coping with her new life as a cancer survivor after completing anti-cancer treatment.  She was offered support today through active listening and expressive supportive counseling.  She was given information regarding our available services and encouraged to contact me with any questions or for help enrolling in any of our support group/programs.    Dispo:   -Repeat CT chest/abdomen/pelvis -Return to cancer center in 6 months for follow up with Dr. Jana Hakim -Appointment with genetics -Left breast screening mammogram in 09/2017 -Follow up with Dr. Dalbert Batman at [facility] in 3 months -She is welcome to return back to the Survivorship Clinic at any time; no additional follow-up needed at this time.  -Consider referral back to survivorship as a long-term survivor for continued surveillance  A total of (30) minutes of face-to-face time was spent with this patient with greater than 50% of that time in counseling and care-coordination.   Gardenia Phlegm, [place] [phone]   Note: PRIMARY CARE PROVIDER Lauree Chandler, [place] [phone]

## 2017-05-10 ENCOUNTER — Telehealth: Payer: Self-pay

## 2017-05-10 NOTE — Telephone Encounter (Signed)
Spoke with pt to remind of SCP visit on 11/19 @ 9 am.  Pt said she would come to appt.

## 2017-05-13 ENCOUNTER — Encounter: Payer: Self-pay | Admitting: Adult Health

## 2017-05-13 ENCOUNTER — Other Ambulatory Visit (HOSPITAL_BASED_OUTPATIENT_CLINIC_OR_DEPARTMENT_OTHER): Payer: Medicare HMO

## 2017-05-13 ENCOUNTER — Telehealth: Payer: Self-pay | Admitting: Oncology

## 2017-05-13 ENCOUNTER — Other Ambulatory Visit (HOSPITAL_COMMUNITY)
Admission: RE | Admit: 2017-05-13 | Discharge: 2017-05-13 | Disposition: A | Discharge status: Home / Self Care | Payer: Medicare HMO | Source: Ambulatory Visit | Attending: Oncology | Admitting: Oncology

## 2017-05-13 ENCOUNTER — Ambulatory Visit (HOSPITAL_BASED_OUTPATIENT_CLINIC_OR_DEPARTMENT_OTHER): Payer: Medicare HMO | Admitting: Adult Health

## 2017-05-13 VITALS — BP 120/68 | HR 74 | Temp 97.7°F | Resp 18 | Ht 69.0 in | Wt 257.6 lb

## 2017-05-13 DIAGNOSIS — B20 Human immunodeficiency virus [HIV] disease: Secondary | ICD-10-CM | POA: Diagnosis not present

## 2017-05-13 DIAGNOSIS — R599 Enlarged lymph nodes, unspecified: Secondary | ICD-10-CM | POA: Diagnosis not present

## 2017-05-13 DIAGNOSIS — C50411 Malignant neoplasm of upper-outer quadrant of right female breast: Secondary | ICD-10-CM

## 2017-05-13 DIAGNOSIS — Z171 Estrogen receptor negative status [ER-]: Secondary | ICD-10-CM | POA: Diagnosis not present

## 2017-05-13 DIAGNOSIS — C50911 Malignant neoplasm of unspecified site of right female breast: Secondary | ICD-10-CM

## 2017-05-13 DIAGNOSIS — R911 Solitary pulmonary nodule: Secondary | ICD-10-CM

## 2017-05-13 LAB — COMPREHENSIVE METABOLIC PANEL
ALT: 12 U/L (ref 0–55)
ANION GAP: 7 meq/L (ref 3–11)
AST: 14 U/L (ref 5–34)
Albumin: 3.5 g/dL (ref 3.5–5.0)
Alkaline Phosphatase: 90 U/L (ref 40–150)
BUN: 17.7 mg/dL (ref 7.0–26.0)
CHLORIDE: 105 meq/L (ref 98–109)
CO2: 29 meq/L (ref 22–29)
Calcium: 10.5 mg/dL — ABNORMAL HIGH (ref 8.4–10.4)
Creatinine: 1.4 mg/dL — ABNORMAL HIGH (ref 0.6–1.1)
EGFR: 48 mL/min/{1.73_m2} — AB (ref >60)
GLUCOSE: 102 mg/dL (ref 70–140)
POTASSIUM: 4.1 meq/L (ref 3.5–5.1)
SODIUM: 140 meq/L (ref 136–145)
TOTAL PROTEIN: 8.1 g/dL (ref 6.4–8.3)
Total Bilirubin: 0.27 mg/dL (ref 0.20–1.20)

## 2017-05-13 LAB — CBC WITH DIFFERENTIAL/PLATELET
BASO%: 0.7 % (ref 0.0–2.0)
BASOS ABS: 0 10*3/uL (ref 0.0–0.1)
EOS%: 3.9 % (ref 0.0–7.0)
Eosinophils Absolute: 0.1 10*3/uL (ref 0.0–0.5)
HEMATOCRIT: 40.3 % (ref 34.8–46.6)
HEMOGLOBIN: 13.1 g/dL (ref 11.6–15.9)
LYMPH#: 1.2 10*3/uL (ref 0.9–3.3)
LYMPH%: 35.4 % (ref 14.0–49.7)
MCH: 32.8 pg (ref 25.1–34.0)
MCHC: 32.6 g/dL (ref 31.5–36.0)
MCV: 100.6 fL (ref 79.5–101.0)
MONO#: 0.3 10*3/uL (ref 0.1–0.9)
MONO%: 9.3 % (ref 0.0–14.0)
NEUT#: 1.7 10*3/uL (ref 1.5–6.5)
NEUT%: 50.7 % (ref 38.4–76.8)
PLATELETS: 158 10*3/uL (ref 145–400)
RBC: 4 10*6/uL (ref 3.70–5.45)
RDW: 16 % — AB (ref 11.2–14.5)
WBC: 3.3 10*3/uL — ABNORMAL LOW (ref 3.9–10.3)

## 2017-05-13 NOTE — Telephone Encounter (Signed)
Gave patient avs report and appointments for December and May

## 2017-05-14 LAB — T-HELPER CELLS (CD4) COUNT (NOT AT ARMC)
CD4 % Helper T Cell: 34 % (ref 33–55)
CD4 T CELL ABS: 400 /uL (ref 400–2700)

## 2017-05-17 ENCOUNTER — Ambulatory Visit (HOSPITAL_COMMUNITY)
Admission: RE | Admit: 2017-05-17 | Discharge: 2017-05-17 | Disposition: A | Discharge status: Home / Self Care | Payer: Medicare HMO | Source: Ambulatory Visit | Attending: Adult Health | Admitting: Adult Health

## 2017-05-17 DIAGNOSIS — R599 Enlarged lymph nodes, unspecified: Secondary | ICD-10-CM | POA: Diagnosis not present

## 2017-05-17 DIAGNOSIS — I700 Atherosclerosis of aorta: Secondary | ICD-10-CM | POA: Insufficient documentation

## 2017-05-17 DIAGNOSIS — K8020 Calculus of gallbladder without cholecystitis without obstruction: Secondary | ICD-10-CM | POA: Diagnosis not present

## 2017-05-17 DIAGNOSIS — R918 Other nonspecific abnormal finding of lung field: Secondary | ICD-10-CM | POA: Diagnosis not present

## 2017-05-17 DIAGNOSIS — R911 Solitary pulmonary nodule: Secondary | ICD-10-CM | POA: Insufficient documentation

## 2017-05-17 LAB — T-HELPER CELLS (CD4) COUNT (NOT AT ARMC)

## 2017-05-17 MED ORDER — IOPAMIDOL (ISOVUE-300) INJECTION 61%
100.0000 mL | Freq: Once | INTRAVENOUS | Status: AC | PRN
  Administered 2017-05-17: 100 mL via INTRAVENOUS

## 2017-05-22 ENCOUNTER — Other Ambulatory Visit: Payer: Medicare HMO

## 2017-05-23 ENCOUNTER — Other Ambulatory Visit: Payer: Self-pay | Admitting: Infectious Disease

## 2017-05-23 DIAGNOSIS — B20 Human immunodeficiency virus [HIV] disease: Secondary | ICD-10-CM

## 2017-05-24 MED FILL — PREZCOBIX 800 MG-150 MG TAB: 800-150 | 30 days supply | Qty: 30 | Fill #0

## 2017-05-24 MED FILL — JULUCA 50-25 MG TAB: 50-25 | 30 days supply | Qty: 30 | Fill #6

## 2017-06-01 ENCOUNTER — Other Ambulatory Visit: Payer: Self-pay | Admitting: Oncology

## 2017-06-05 ENCOUNTER — Ambulatory Visit: Payer: Medicare HMO | Admitting: Infectious Disease

## 2017-06-10 ENCOUNTER — Other Ambulatory Visit: Payer: Self-pay | Admitting: *Deleted

## 2017-06-10 MED ORDER — HYDROCODONE-ACETAMINOPHEN 5-325 MG PO TABS: 1.0000 | ORAL_TABLET | Freq: Four times a day (QID) | ORAL | 0 refills | Status: DC | PRN

## 2017-06-10 NOTE — Telephone Encounter (Signed)
Patient requested refill Nambe Verified Pended Rx and sent to [facility] for Approval.

## 2017-06-11 NOTE — Telephone Encounter (Signed)
Patient notified and will pick up

## 2017-06-17 ENCOUNTER — Encounter: Payer: Self-pay | Admitting: Genetics

## 2017-06-17 ENCOUNTER — Ambulatory Visit (HOSPITAL_BASED_OUTPATIENT_CLINIC_OR_DEPARTMENT_OTHER): Payer: Medicare HMO | Admitting: Genetics

## 2017-06-17 ENCOUNTER — Other Ambulatory Visit: Payer: Medicare HMO

## 2017-06-17 DIAGNOSIS — Z807 Family history of other malignant neoplasms of lymphoid, hematopoietic and related tissues: Secondary | ICD-10-CM

## 2017-06-17 DIAGNOSIS — Z801 Family history of malignant neoplasm of trachea, bronchus and lung: Secondary | ICD-10-CM | POA: Insufficient documentation

## 2017-06-17 DIAGNOSIS — C50411 Malignant neoplasm of upper-outer quadrant of right female breast: Secondary | ICD-10-CM

## 2017-06-17 DIAGNOSIS — Z171 Estrogen receptor negative status [ER-]: Secondary | ICD-10-CM

## 2017-06-17 DIAGNOSIS — C50911 Malignant neoplasm of unspecified site of right female breast: Secondary | ICD-10-CM

## 2017-06-17 NOTE — Progress Notes (Signed)
REFERRING PROVIDER: Gardenia Phlegm, NP [place], [place] [address]  PRIMARY PROVIDER:  Lauree Chandler, NP  PRIMARY REASON FOR VISIT:  1. Malignant neoplasm of upper-outer quadrant of right breast in female, estrogen receptor negative ([place])   2. Family history of non-Hodgkin's lymphoma   3. Family history of lung cancer   4. Recurrent cancer of right breast ([place])    HISTORY OF PRESENT ILLNESS:   Kayla Price, a 60 y.o. female, was seen for a [place] cancer genetics consultation at the request of Dr. Delice Bison due to a personal and family history of cancer.  Kayla Price presents to clinic today to discuss the possibility of a hereditary predisposition to cancer, genetic testing, and to further clarify her future cancer risks, as well as potential cancer risks for family members.   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  She had a R lumpectomy followed by adjuvant chemotherapy and radiation.  In April 2018 at the ag eof 60. Her breast cancer recurred.  She had a R modified radical mastectomy followed by adjuvant chemotherapy.    CANCER HISTORY:    Malignant neoplasm of upper-outer quadrant of right breast in female, estrogen receptor negative ([place])   03/2004 Surgery    Right lumpectomy and SLNB for IDC, 0.5cm, 1/2 + SLN, grade 3, ER-, PR-, HER-2 -.  Treated with Doxorubicin and Cyclophosphamide x 4, followed by weekly Paclitaxel x 7 and adjuvant radiation.       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 Lymph node: Positive      11/05/2016 Surgery    Right modified radical mastectomy: IDC, grade 3, 2.6 cm, +LVI, triple negative, margins negative, 2/10 LN + metastases.  T2, N1a      11/20/2016 - 03/11/2017 Adjuvant Chemotherapy    Gemcitabine/Carboplatin given on days 1 and 8 on a 21 day cycle x 6 cycles        HORMONAL RISK FACTORS:   Menarche was at age 71.  First live birth at age 50.  Ovaries intact: yes.  Hysterectomy: no.  Menopausal status: postmenopausal. 2005 HRT use: 0 years.  She reports she has had hysteroscopies and an abnormal PAP smear years ago.  She had some cervical polyps and spotting.  She had a vaginal ultrasound in 2018 that was reportedly normal.     Past Medical History:  Diagnosis Date  . Alopecia areata 11/28/2009  . Bell's palsy   . Breast cancer ([place])   . BREAST CANCER, HX OF 2005  . Cancer [facility]) 2005   Breast cancer   chemotherapy and radiation  . CKD (chronic kidney disease) stage 3, GFR 30-59 ml/min ([facility]) 06/08/2015  . Dry eye syndrome   . Family history of lung cancer   . Family history of non-Hodgkin's lymphoma   . Fasting hyperglycemia   . HIP FRACTURE, RIGHT 05/06/2008  . History of kidney stones   . HIV (human immunodeficiency virus infection) ([place])   . HIV DISEASE 03/27/2006  . HYPERLIPIDEMIA, MIXED 12/15/2007  . HYPERTENSION 03/27/2006  . 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 MENORRHAGIA, POSTMENOPAUSAL 02/03/2009  . Osteoarthritis of left knee 06/08/2015  . OSTEOARTHROSIS, LOCAL, SCND, UNSPC SITE 04/07/2007  . PVD 04/07/2007  . Tinea capitis   . TRIGGER FINGER 05/06/2008  . Unspecified vitamin D deficiency 08/06/2007    Past Surgical History:  Procedure Laterality Date  .  BREAST LUMPECTOMY Right    2005  . COLONOSCOPY    . COLONOSCOPY WITH ESOPHAGOGASTRODUODENOSCOPY (EGD)    . HYSTEROSCOPY  2006  . KNEE ARTHROSCOPY Left 2002  . LYMPH NODE DISSECTION  2005  . MASTECTOMY MODIFIED RADICAL Right 11/05/2016   Procedure: RIGHT MASTECTOMY MODIFIED RADICAL;  Surgeon: Fanny Skates, MD;  Location: [facility];  Service: General;  Laterality: Right;  . MODIFIED RADICAL MASTECTOMY Right 11/05/2016  . placement   of port-a-cath    . PORT-A-CATH REMOVAL  2006   insertion 2005  . PORT-A-CATH REMOVAL N/A 03/29/2017   Procedure: REMOVAL PORT-A-CATH;  Surgeon: Fanny Skates,  MD;  Location: [facility];  Service: General;  Laterality: N/A;  . PORTACATH PLACEMENT Right 11/05/2016   Procedure: INSERTION PORT-A-CATH WITH ULTRA SOUND;  Surgeon: Fanny Skates, MD;  Location: [facility];  Service: General;  Laterality: Right;  . removal of port a cath  12/2014  . THYROID SURGERY  2009   Ablation   . TOTAL KNEE ARTHROPLASTY Left 02/06/2016   Procedure: TOTAL KNEE ARTHROPLASTY;  Surgeon: Frederik Pear, MD;  Location: [facility];  Service: Orthopedics;  Laterality: Left;  . TUBAL LIGATION      Social History   Socioeconomic History  . Marital status: Widowed    Spouse name: Not on file  . Number of children: Not on file  . Years of education: Not on file  . Highest education level: Not on file  Social Needs  . Financial resource strain: Not on file  . Food insecurity - worry: Not on file  . Food insecurity - inability: Not on file  . Transportation needs - medical: Not on file  . Transportation needs - non-medical: Not on file  Occupational History  . Occupation: Paediatric nurse: UNEMPLOYED  Tobacco Use  . Smoking status: Never Smoker  . Smokeless tobacco: Never Used  Substance and Sexual Activity  . Alcohol use: No  . Drug use: No  . Sexual activity: Not Currently    Birth control/protection: Post-menopausal    Comment: declined condoms  Other Topics Concern  . Not on file  Social History Narrative   Single/widow   Diet: good   Do you drink/eat things with caffeine? Tea occasionally   Marital status: Widowed  What year were you married? 1993   Do you live in a house, apartment,assisted living, condo,trailer,ect.)? House   Is it one or more stories? Two   How many persons live in your home? 4   Do you have any pets in you home? No   Current or past profession: Tour manager   Do you exercise? Yes  Type&how often: Stationary Bike, Water Exercise 3-4 x week   Do you have a living will? Yes   Do you have a DNR form? No  If not do you what one?   Do you  have signed POA/HPOA forms? No   If so, please bring to your appointment.                 FAMILY HISTORY:  We obtained a detailed, 4-generation family history.  Significant diagnoses are listed below: Family History  Problem Relation Age of Onset  . Heart attack Brother        Massive MI in 20s  . Stroke Brother        CAD  . Kidney disease Mother   . Stroke Mother   . Diabetes Mother   . Liver disease Sister   .  COPD Sister        had I-131 rx of hyperthyroidism  . Diabetes Sister   . Stroke Sister   . Lung cancer Paternal Uncle        hx smoking  . Cancer Cousin   . Non-Hodgkin's lymphoma Cousin 25       cancer x3, in prostate and lung- unsure if met/spread or if primaries  . Heart failure Father   . Heart disease Father   . Arthritis Father   . Sarcoidosis Sister    Kayla Price has a son and a 56 year-old daughter with no history of cancer.  Her daughter has a 36 year-old daughter.  Kayla Price has 4 sisters and 3 brothers: -1 sister is 68 with no history of cancer.  -1 sister died at 3 due to liver disease. She had 5 children.  -1 sister died in her 98's due to a stroke.  -1 sister died in her 45's and had COPD.  -1 brother died at 42 due to a heart attack.   -1 brother died in his 30's due to a stroke.  -1 brother died in his 44's due to an accident (49 a heart attack) Kayla Price has several nieces and nephews- none with any history of cancer.   Kayla Price father died at 61 due to Alzheimer and heart disease.  She had 2 paternal uncles and 1 paternal aunt.  One of her uncles died at 34 due to heart problems/arrythmia and her other paternal uncle died in his 62's due to lung cancer (he had a history of smoking)  His son was diagnosed with non-hodgkings lymphoma in his 20's and had cancer 2 other times in lung anc prostate- but Ms. Lisbon does not know if it was all the same cancer or separate primaries.  He died at 66.  Her paternal aunt died in her 61's with no  history of cancer.  Kayla Price paternal grandfather died in his 68's with no history of cancer, and her paternal grandmother at 29 with no history of cancer.  Kayla Price mother died at 73 with no history of cancer.  Her mother had a full sister who died at 38 with no history of cancer. Kayla Price mother also had a paternal half sister and a maternal half sister and half-brother who all died in their 23's with no history of cancer.  Kayla Price has several maternal cousins, none with any history of cancer.  Kayla Price reports that several of her maternal relatives have had benign growths- cysts, armpit and breast growths.  Kayla Price maternal grandparents died in their 29's/70's with no history of cancer.   Kayla Price is unaware of previous family history of genetic testing for hereditary cancer risks. Patient's maternal ancestors are of Black descent, and paternal ancestors are of Black/Native American descent. There is no reported Ashkenazi Jewish ancestry. There is no known consanguinity.  GENETIC COUNSELING ASSESSMENT: Kayla Price is a 61 y.o. female with a personal history which is somewhat suggestive of a Hereditary Cancer Predisposition Syndrome. We, therefore, discussed and recommended the following at today's visit.   DISCUSSION: We reviewed the characteristics, features and inheritance patterns of hereditary cancer syndromes. We also discussed genetic testing, including the appropriate family members to test, the process of testing, insurance coverage and turn-around-time for results. We discussed the implications of a negative, positive and/or variant of uncertain significant result. We recommended Kayla Price pursue genetic testing for the Common Hereditary Cancer +  myelodysplastic/Leukemia gene panel.  The Common Hereditary Cancer Panel offered by [facility] includes sequencing and/or deletion duplication testing of the following 47 genes: APC, ATM, AXIN2, BARD1, BMPR1A, BRCA1, BRCA2,  BRIP1, CDH1, CDKN2A (p14ARF), CDKN2A (p16INK4a), CKD4, CHEK2, CTNNA1, DICER1, EPCAM (Deletion/duplication testing only), GREM1 (promoter region deletion/duplication testing only), KIT, MEN1, MLH1, MSH2, MSH3, MSH6, MUTYH, NBN, NF1, NHTL1, PALB2, PDGFRA, PMS2, POLD1, POLE, PTEN, RAD50, RAD51C, RAD51D, SDHB, SDHC, SDHD, SMAD4, SMARCA4. STK11, TP53, TSC1, TSC2, and VHL.  The following genes were evaluated for sequence changes only: SDHA and HOXB13 c.251G>A variant only.  The [facility] Myelodysplastic Syndrome/Leukemia Panel analyzes the following genes:  ATM BLM CEBPA EPCAM GATA2 HRAS MLH1 MSH2 MSH6 NBN NF1 PMS2 RUNX1 TERC TERT TP53 BRCA1 BRCA2 BRIP1 CHEK2 PALB2   We discussed that only 5-10% of cancers are associated with a Hereditary cancer predisposition syndrome.  One of the most common hereditary cancer syndromes that increases breast cancer risk is called Hereditary Breast and Ovarian Cancer (HBOC) syndrome.  This syndrome is caused by mutations in the BRCA1 and BRCA2 genes.  This syndrome increases an individual's lifetime risk to develop breast, ovarian, pancreatic, and other types of cancer.  There are also many other cancer predisposition syndromes caused by mutations in several other genes.  We discussed that if she is found to have a mutation in one of these genes, it may impact future medical management recommendations such as increased cancer screenings and consideration of risk reducing surgeries.  A positive result could also have implications for the patient's family members.  A Negative result would mean we were unable to identify a hereditary component to her cancer, but does not rule out the possibility of a hereditary basis for her cancer.  There could be mutations that are undetectable by current technology, or in genes not yet tested or identified to increase cancer risk.    We discussed the potential to find a Variant of Uncertain Significance or VUS.  These are variants that have not  yet been identified as pathogenic or benign, and it is unknown if this variant is associated with increased cancer risk or if this is a normal finding.  Most VUS's are reclassified to benign or likely benign.   It should not be used to make medical management decisions. With time, we suspect the lab will determine the significance of any VUS's identified if any.   Based on Kayla Price personal history of cancer, she meets medical criteria for genetic testing. Despite that she meets criteria, she may still have an out of pocket cost. We discussed that if her out of pocket cost for testing is over $100, the laboratory will call and confirm whether she wants to proceed with testing.  If the out of pocket cost of testing is less than $100 she will be billed by the genetic testing laboratory.   PLAN: After considering the risks, benefits, and limitations, Ms. Effertz  provided informed consent to pursue genetic testing and the blood sample was sent to [facility] for analysis of the Common Hereditary Cancer Panel + Myelodysplastic/Leukemia Panel. Results should be available within approximately 2-3 weeks' time, at which point they will be disclosed by telephone to Ms. Bianchini, as will any additional recommendations warranted by these results. Ms. Heal will receive a summary of her genetic counseling visit and a copy of her results once available. This information will also be available in Epic. We encouraged Ms. Hipp to remain in contact with cancer genetics annually so that we can  continuously update the family history and inform her of any changes in cancer genetics and testing that may be of benefit for her family. Ms. Gathright questions were answered to her satisfaction today. Our contact information was provided should additional questions or concerns arise.  Lastly, we encouraged Ms. Lunz to remain in contact with cancer genetics annually so that we can continuously update the family history and  inform her of any changes in cancer genetics and testing that may be of benefit for this family.   Ms.  Villers questions were answered to her satisfaction today. Our contact information was provided should additional questions or concerns arise. Thank you for the referral and allowing [place] to share in the care of your patient.   Tana Felts, MS Genetic Counselor [email or web address] phone: [phone]  The patient was seen for a total of 60 minutes in face-to-face genetic counseling.  The patient was accompanied today by granddaughter who waited outside in the waiting area.  This patient was discussed with Drs. Magrinat, Lindi Adie and/or Burr Medico who agrees with the above.

## 2017-06-19 ENCOUNTER — Other Ambulatory Visit: Payer: 59

## 2017-06-27 ENCOUNTER — Encounter: Payer: Self-pay | Admitting: Genetics

## 2017-06-27 ENCOUNTER — Ambulatory Visit: Payer: Self-pay | Admitting: Genetics

## 2017-06-27 ENCOUNTER — Ambulatory Visit
Admission: RE | Admit: 2017-06-27 | Discharge: 2017-06-27 | Disposition: A | Discharge status: Home / Self Care | Payer: Medicare HMO | Source: Ambulatory Visit | Attending: Internal Medicine | Admitting: Internal Medicine

## 2017-06-27 ENCOUNTER — Telehealth: Payer: Self-pay | Admitting: Genetics

## 2017-06-27 DIAGNOSIS — C50411 Malignant neoplasm of upper-outer quadrant of right female breast: Secondary | ICD-10-CM

## 2017-06-27 DIAGNOSIS — Z801 Family history of malignant neoplasm of trachea, bronchus and lung: Secondary | ICD-10-CM

## 2017-06-27 DIAGNOSIS — Z1379 Encounter for other screening for genetic and chromosomal anomalies: Secondary | ICD-10-CM

## 2017-06-27 DIAGNOSIS — E890 Postprocedural hypothyroidism: Secondary | ICD-10-CM

## 2017-06-27 DIAGNOSIS — E049 Nontoxic goiter, unspecified: Secondary | ICD-10-CM | POA: Diagnosis not present

## 2017-06-27 DIAGNOSIS — Z171 Estrogen receptor negative status [ER-]: Secondary | ICD-10-CM

## 2017-06-27 DIAGNOSIS — Z807 Family history of other malignant neoplasms of lymphoid, hematopoietic and related tissues: Secondary | ICD-10-CM

## 2017-06-27 DIAGNOSIS — E010 Iodine-deficiency related diffuse (endemic) goiter: Secondary | ICD-10-CM

## 2017-06-27 NOTE — Telephone Encounter (Signed)
Revealed negative genetic testing.  Revealed that a VUS in NTHL was identified.   This normal result is reassuring and indicates that it is unlikely Kayla Price's cancer is due to a hereditary cause.  It is unlikely that there is an increased risk of another cancer due to a mutation in one of these genes.  However, genetic testing is not perfect, and cannot definitively rule out a hereditary cause.  It will be important for her to keep in contact with genetics to learn if any additional testing may be needed in the future.     We discussed that this test only analyzed cancer risk genes, there are genes that can impact risk for cardiovascular disease, strokes, and other conditions, but this test only analyzed cancer risk.  If she was interested in testing for cardiovascular genetic risk she could discuss this with her doctors.  She expressed understanding that this test only analyzed cancer risk genes.  I also recommended that her daughter start mammograms starting by age 68 given the family history.

## 2017-06-27 NOTE — Progress Notes (Signed)
HPI: Ms. Montelongo was previously seen in the [place] clinic on 06/17/2017 due to a personal history of triple negative breast cancer and concerns regarding a hereditary predisposition to cancer. Please refer to our prior cancer genetics clinic note for more information regarding Ms. Fesperman's medical, social and family histories, and our assessment and recommendations, at the time. Ms. Glanzer recent genetic test results were disclosed to her, as well as recommendations warranted by these results. These results and recommendations are discussed in more detail below.  CANCER HISTORY:    Malignant neoplasm of upper-outer quadrant of right breast in female, estrogen receptor negative ([place])   03/2004 Surgery    Right lumpectomy and SLNB for IDC, 0.5cm, 1/2 + SLN, grade 3, ER-, PR-, HER-2 -.  Treated with Doxorubicin and Cyclophosphamide x 4, followed by weekly Paclitaxel x 7 and adjuvant radiation.       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 Lymph node: Positive      11/05/2016 Surgery    Right modified radical mastectomy: IDC, grade 3, 2.6 cm, +LVI, triple negative, margins negative, 2/10 LN + metastases.  T2, N1a      11/20/2016 - 03/11/2017 Adjuvant Chemotherapy    Gemcitabine/Carboplatin given on days 1 and 8 on a 21 day cycle x 6 cycles        FAMILY HISTORY:  We obtained a detailed, 4-generation family history.  Significant diagnoses are listed below: Family History  Problem Relation Age of Onset  . Heart attack Brother        Massive MI in 47s  . Stroke Brother        CAD  . Kidney disease Mother   . Stroke Mother   . Diabetes Mother   . Liver disease Sister   . COPD Sister        had I-131 rx of hyperthyroidism  . Diabetes Sister   . Stroke Sister   . Lung cancer Paternal Uncle        hx smoking  . Cancer Cousin   . Non-Hodgkin's lymphoma Cousin 25       cancer x3, in prostate and lung-  unsure if met/spread or if primaries  . Heart failure Father   . Heart disease Father   . Arthritis Father   . Sarcoidosis Sister    Ms. Pillard has a son and a 77 year-old daughter with no history of cancer.  Her daughter has a 42 year-old daughter.  Ms. Mundorf has 4 sisters and 3 brothers: -1 sister is 46 with no history of cancer.  -1 sister died at 77 due to liver disease. She had 5 children.  -1 sister died in her 57's due to a stroke.  -1 sister died in her 40's and had COPD.  -1 brother died at 52 due to a heart attack.   -1 brother died in his 58's due to a stroke.  -1 brother died in his 43's due to an accident (89 a heart attack) Ms. Pe has several nieces and nephews- none with any history of cancer.   Ms. Sigman father died at 58 due to Alzheimer and heart disease.  She had 2 paternal uncles and 1 paternal aunt.  One of her uncles died at 42 due to heart problems/arrythmia and her other paternal uncle died in his 23's due to lung cancer (he had a history of smoking)  His son was diagnosed with non-hodgkings  lymphoma in his 20's and had cancer 2 other times in lung anc prostate- but Ms. Borenstein does not know if it was all the same cancer or separate primaries.  He died at 27.  Her paternal aunt died in her 25's with no history of cancer.  Ms. Hankinson paternal grandfather died in his 80's with no history of cancer, and her paternal grandmother at 9 with no history of cancer.  Ms. Lafever mother died at 68 with no history of cancer.  Her mother had a full sister who died at 46 with no history of cancer. Ms. Nilsson mother also had a paternal half sister and a maternal half sister and half-brother who all died in their 39's with no history of cancer.  Ms. Waldrip has several maternal cousins, none with any history of cancer.  Ms. Mccollister reports that several of her maternal relatives have had benign growths- cysts, armpit and breast growths.  Ms. Elbaum maternal grandparents  died in their 71's/70's with no history of cancer.   Ms. Blondin is unaware of previous family history of genetic testing for hereditary cancer risks. Patient's maternal ancestors are of Black descent, and paternal ancestors are of Black/Native American descent. There is no reported Ashkenazi Jewish ancestry. There is no known consanguinity.  GENETIC TEST RESULTS: Genetic testing performed through [facility] Common Hereditary Cancer Panel + Myelodysplastic/Leukemia Panel reported out on 06/24/2017 showed no pathogenic mutations. APC, ATM, AXIN2, BARD1, BLM, BMPR1A, BRCA1, BRCA2, BRIP1, CDH1, CDK4, CDKN2A (p14ARF), CDKN2A (p16INK4a), CEBPA, CHEK2, CTNNA1, DICER1, EPCAM*, GATA2, GREM1*, HRAS, KIT, MEN1, MLH1, MSH2, MSH3, MSH6, MUTYH, NBN, NF1, PALB2, PDGFRA, PMS2, POLD1, POLE, PTEN, RAD50, RAD51C, RAD51D, RUNX1, SDHB, SDHC, SDHD, SMAD4, SMARCA4, STK11, TERC, TERT, TP53, TSC1, TSC2, VHL. The following genes were evaluated for sequence changes only: HOXB13*, NTHL1*, SDHA.  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 [id number] ([id number])  The test report will be scanned into EPIC and will be located under the Molecular Pathology section of the Results Review tab.A portion of the result report is included below for reference.     We discussed with Ms. Irby that because current genetic testing is not perfect, it is possible there may be a gene mutation in one of these genes that current testing cannot detect, but that chance is small. We also discussed, that there could be another gene that has not yet been discovered, or that we have not yet tested, that is responsible for the cancer diagnoses in the family. It is also possible there is a hereditary cause for the cancer in the family that Ms. Arai did not inherit and therefore was not identified in her testing.  Therefore, it is important to remain in touch with cancer genetics in the future so that we can continue  to offer Ms. Kirker the most up to date genetic testing.   Regarding the VUS in [place]: At this time, it is unknown if this variant is associated with increased cancer risk or if this is a normal finding, but most variants such as this get reclassified to being inconsequential. It should not be used to make medical management decisions. With time, we suspect the lab will determine the significance of this variant, if any. If we do learn more about it, we will try to contact Ms. Cockerill to discuss it further. However, it is important to stay in touch with [place] periodically and keep the address and phone number up to date.  ADDITIONAL GENETIC  TESTING: We discussed with Ms. Kolek that there are other genes that are associated with increased cancer risk that can be analyzed. The laboratories that offer this testing look at these additional genes via a hereditary cancer gene panel. Should Ms. Schuchard wish to pursue additional genetic testing, we are happy to discuss and coordinate this testing, at any time.  Ms. Kook understands that this genetic test only analyzed genes associated with cancer risk and not with other genetic diseases.    CANCER SCREENING RECOMMENDATIONS: This result is indicates that it is unlikely Ms. Barnhill has an increased risk for a future cancer due to a mutation in one of these genes. This normal test also suggests that Ms. Canady's cancer was most likely not due to an inherited predisposition associated with one of these genes.  Most cancers happen by chance and this negative test suggests that her cancer may fall into this category.  However, genetic testing is not perfect, and it is still possible that there could be genetic mutations that are undetectable by current technology, or genetic mutations in genes that have not been tested or identified to increase cancer risk.  Therefore, it is recommended she continue to follow the cancer management and screening guidelines provided by her  oncology and primary healthcare provider. Other factors such as her personal and family history may still affect her cancer risk.  RECOMMENDATIONS FOR FAMILY MEMBERS: Women in this family might be at some increased risk of developing cancer, over the general population risk, simply due to the family history of cancer. We recommended women in this family have a yearly mammogram beginning at age 40, or 10 years younger than the earliest onset of cancer, an annual clinical breast exam, and perform monthly breast self-exams. We recommend her daughter begin annual mammograms starting by age 38. Women in this family should also have a gynecological exam as recommended by their primary provider. All family members should have a colonoscopy by age 50.  All family members should inform their physicians about the family history of cancer so their doctors can make the most appropriate screening recommendations for them.   FOLLOW-UP: Lastly, we discussed with Ms. Tomb that cancer genetics is a rapidly advancing field and it is possible that new genetic tests will be appropriate for her and/or her family members in the future. We encouraged her to remain in contact with cancer genetics on an annual basis so we can update her personal and family histories and let her know of advances in cancer genetics that may benefit this family.   Our contact number was provided. Ms. Waring's questions were answered to her satisfaction, and she knows she is welcome to call us at anytime with additional questions or concerns.   Lindsay Smith, MS Genetic Counselor [email or web address] 

## 2017-07-01 ENCOUNTER — Ambulatory Visit (INDEPENDENT_AMBULATORY_CARE_PROVIDER_SITE_OTHER): Payer: Medicare HMO | Admitting: Infectious Disease

## 2017-07-01 ENCOUNTER — Encounter: Payer: Self-pay | Admitting: Infectious Disease

## 2017-07-01 VITALS — BP 158/89 | HR 70 | Temp 97.5°F | Ht 67.0 in | Wt 253.0 lb

## 2017-07-01 DIAGNOSIS — E782 Mixed hyperlipidemia: Secondary | ICD-10-CM

## 2017-07-01 DIAGNOSIS — C50411 Malignant neoplasm of upper-outer quadrant of right female breast: Secondary | ICD-10-CM | POA: Diagnosis not present

## 2017-07-01 DIAGNOSIS — Z113 Encounter for screening for infections with a predominantly sexual mode of transmission: Secondary | ICD-10-CM

## 2017-07-01 DIAGNOSIS — B20 Human immunodeficiency virus [HIV] disease: Secondary | ICD-10-CM | POA: Diagnosis not present

## 2017-07-01 DIAGNOSIS — I10 Essential (primary) hypertension: Secondary | ICD-10-CM | POA: Diagnosis not present

## 2017-07-01 DIAGNOSIS — Z171 Estrogen receptor negative status [ER-]: Secondary | ICD-10-CM

## 2017-07-01 DIAGNOSIS — Z79899 Other long term (current) drug therapy: Secondary | ICD-10-CM | POA: Diagnosis not present

## 2017-07-01 NOTE — Patient Instructions (Signed)
Think about the idea of adding Maraviroc (Selzentry) 150 mg twice daily to your regimen it may have anti tumor effects (it does in animal models) that in theory could protect against recurrence of the breast cancer  It would likely be covered for you since it is an antiviral vs HIV

## 2017-07-01 NOTE — Progress Notes (Signed)
Chief complaint: followup for HIV, and breast cancer Subjective:    Patient ID: Kayla Price, female    DOB: February 11, 1956, 62 y.o.   MRN: [id number]  HPI  Kayla Price is a 62 y.o. female who is doing superbly well on her  antiviral regimen, of JULUCA and Prezcobix  with undetectable viral load and health cd4 count.   She has been diagnosed with with recurrent  breast cancer and is sp ight breast upper outer quadrant biopsy and right axillary lymph node biopsy 10/01/2016, both positive for a T2 N1, stage IIIB invasive ductal carcinoma, triple negative, with an MIB-1 of 50-70%  She is now status post right modified radical mastectomy 11/05/2016 showing a pT2 pN1, stage IIIB invasive ductal carcinoma, grade 3, triple negative, with negative margins with LN sampling, portacath insertion.   She started gemcitabine and carboplatin in May and  Her CD4 dipped on therapy and she has been on bactrim.   She retains virological suppression   With re to her ARV hx Kayla Price had reviewed prior gentoypes below:   At prior [facility] and I reviewed her  HIV and pulled in ALL of the R mutations noted in Dr Ruffin Frederick notes and we had neglected to mention a 41L which when added to all of her other nutation is essentially takes out all the and NRTI's and led to change from prior regimen of Isentress, Atripla to current one.  HIVdb: Genotypic Resistance Interpretation Algorithm  Date: 27-Oct-2014 17:15:11 UTC   Drug Resistance Interpretation: PR PI Major Resistance Mutations: None PI Minor Resistance Mutations: None Other Mutations: V77I Protease Inhibitors atazanavir/r (ATV/r) Susceptible darunavir/r (DRV/r) Susceptible fosamprenavir/r (FPV/r) Susceptible indinavir/r (IDV/r) Susceptible lopinavir/r (LPV/r) Susceptible nelfinavir (NFV) Susceptible saquinavir/r (SQV/r) Susceptible tipranavir/r (TPV/r) Susceptible PR Comments  Drug Resistance Interpretation: RT NRTI Resistance Mutations: M41L,  D67N, M184V, L210W, T215Y NNRTI Resistance Mutations: None Other Mutations: E44D, V118I Nucleoside RTI lamivudine (3TC) High-level resistance abacavir (ABC) High-level resistance zidovudine (AZT) High-level resistance stavudine (D4T) High-level resistance didanosine (DDI) High-level resistance emtricitabine (FTC) High-level resistance tenofovir (TDF) High-level resistance  Non-Nucleoside RTI efavirenz (EFV) Susceptible etravirine (ETR) Susceptible nevirapine (NVP) Susceptible rilpivirine (RPV) Susceptible   She has completed chemotherapy and her CD4 is back up above 400.  She said that she tolerated the chemotherapy quite well.  She did state that she started taking her Bactrim again because she was trying to prevent herself from getting pneumonias.  I explained to her that the Bactrim was really only useful for preventing fungal pneumonia due to PCP when her CD4 count would be low and that it was not necessary with a high CD4 count in the 400s.   Lab Results  Component Value Date   HIV1RNAQUANT 30 (H) 01/23/2017   HIV1RNAQUANT <20 NOT DETECTED 11/15/2016   HIV1RNAQUANT <20 05/28/2016     Lab Results  Component Value Date   CD4TABS See Separate Report 05/13/2017   CD4TABS 400 05/13/2017   CD4TABS See Separate Report 03/04/2017       Past Medical History:  Diagnosis Date  . Alopecia areata 11/28/2009  . Bell's palsy   . Breast cancer ([place])   . BREAST CANCER, HX OF 2005  . Cancer [facility]) 2005   Breast cancer   chemotherapy and radiation  . CKD (chronic kidney disease) stage 3, GFR 30-59 ml/min (HCC) 06/08/2015  . Dry eye syndrome   . Family history of lung cancer   . Family history of non-Hodgkin's lymphoma   . Fasting hyperglycemia   .  HIP FRACTURE, RIGHT 05/06/2008  . History of kidney stones   . HIV (human immunodeficiency virus infection) ([place])   . HIV DISEASE 03/27/2006  . HYPERLIPIDEMIA, MIXED 12/15/2007  . HYPERTENSION 03/27/2006  . HYPOTHYROIDISM,  POST-RADIATION 06/28/2008  . MENORRHAGIA, POSTMENOPAUSAL 02/03/2009  . Osteoarthritis of left knee 06/08/2015  . OSTEOARTHROSIS, LOCAL, SCND, UNSPC SITE 04/07/2007  . PVD 04/07/2007  . Tinea capitis   . TRIGGER FINGER 05/06/2008  . Unspecified vitamin D deficiency 08/06/2007    Past Surgical History:  Procedure Laterality Date  . BREAST LUMPECTOMY Right    2005  . COLONOSCOPY    . COLONOSCOPY WITH ESOPHAGOGASTRODUODENOSCOPY (EGD)    . HYSTEROSCOPY  2006  . KNEE ARTHROSCOPY Left 2002  . LYMPH NODE DISSECTION  2005  . MASTECTOMY MODIFIED RADICAL Right 11/05/2016   Procedure: RIGHT MASTECTOMY MODIFIED RADICAL;  Surgeon: Fanny Skates, MD;  Location: [place];  Service: General;  Laterality: Right;  . MODIFIED RADICAL MASTECTOMY Right 11/05/2016  . placement   of port-a-cath    . PORT-A-CATH REMOVAL  2006   insertion 2005  . PORT-A-CATH REMOVAL N/A 03/29/2017   Procedure: REMOVAL PORT-A-CATH;  Surgeon: Fanny Skates, MD;  Location: [place];  Service: General;  Laterality: N/A;  . PORTACATH PLACEMENT Right 11/05/2016   Procedure: INSERTION PORT-A-CATH WITH ULTRA SOUND;  Surgeon: Fanny Skates, MD;  Location: [place];  Service: General;  Laterality: Right;  . removal of port a cath  12/2014  . THYROID SURGERY  2009   Ablation   . TOTAL KNEE ARTHROPLASTY Left 02/06/2016   Procedure: TOTAL KNEE ARTHROPLASTY;  Surgeon: Frederik Pear, MD;  Location: [place];  Service: Orthopedics;  Laterality: Left;  . TUBAL LIGATION      Family History  Problem Relation Age of Onset  . Heart attack Brother        Massive MI in 33s  . Stroke Brother        CAD  . Kidney disease Mother   . Stroke Mother   . Diabetes Mother   . Liver disease Sister   . COPD Sister        had I-131 rx of hyperthyroidism  . Diabetes Sister   . Stroke Sister   . Lung cancer Paternal Uncle        hx smoking  . Cancer Cousin   . Non-Hodgkin's lymphoma Cousin 25       cancer x3, in prostate and lung- unsure if met/spread or  if primaries  . Heart failure Father   . Heart disease Father   . Arthritis Father   . Sarcoidosis Sister       Social History   Socioeconomic History  . Marital status: Widowed    Spouse name: None  . Number of children: None  . Years of education: None  . Highest education level: None  Social Needs  . Financial resource strain: None  . Food insecurity - worry: None  . Food insecurity - inability: None  . Transportation needs - medical: None  . Transportation needs - non-medical: None  Occupational History  . Occupation: Paediatric nurse: UNEMPLOYED  Tobacco Use  . Smoking status: Never Smoker  . Smokeless tobacco: Never Used  Substance and Sexual Activity  . Alcohol use: No  . Drug use: No  . Sexual activity: Not Currently    Birth control/protection: Post-menopausal    Comment: declined condoms  Other Topics Concern  . None  Social History Narrative   Single/widow  Diet: good   Do you drink/eat things with caffeine? Tea occasionally   Marital status: Widowed  What year were you married? 1993   Do you live in a house, apartment,assisted living, condo,trailer,ect.)? House   Is it one or more stories? Two   How many persons live in your home? 4   Do you have any pets in you home? No   Current or past profession: Tour manager   Do you exercise? Yes  Type&how often: Stationary Bike, Water Exercise 3-4 x week   Do you have a living will? Yes   Do you have a DNR form? No  If not do you what one?   Do you have signed POA/HPOA forms? No   If so, please bring to your appointment.                Allergies  Allergen Reactions  . Lisinopril Anaphylaxis and Swelling    Swelling of tongue and mouth 11/05/16- tolerates Olmesartan  . Pepcid [Famotidine] Other (See Comments)    PPI H2, BLOCKERS LOWER GASTRIC PH WHICH WOULD LEAD TO SUBTHERAPEUTIC RILPIVIRINE LEVELS AND POTENTIAL VIROLOGICAL FAILURE WITH RESISTANCE  . Prilosec [Omeprazole] Other (See Comments)     PPI H2, BLOCKERS LOWER GASTRIC PH WHICH WOULD LEAD TO SUBTHERAPEUTIC RILPIVIRINE LEVELS AND POTENTIAL VIROLOGICAL FAILURE WITH RESISTANCE  . Tums [Calcium Carbonate Antacid] Other (See Comments)    TUMS ANTACIDS CAN LOWER GASTRIC PH WHICH COULD  LEAD TO SUBTHERAPEUTIC RILPIVIRINE LEVELS AND POTENTIAL VIROLOGICAL FAILURE WITH RESISTANCE TUMS CAN BE GIVEN BUT NEED CONSULT WITH ID PHARMACY RE TIMING. I PREFER HER TO AVOID ALL TOGETHER     Current Outpatient Medications:  .  CALCIUM-VITAMIN D PO, Take 1 tablet by mouth daily., Disp: , Rfl:  .  Cholecalciferol (VITAMIN D3) 2000 units TABS, Take 2,000 Units by mouth daily with lunch., Disp: , Rfl:  .  Dolutegravir-Rilpivirine (JULUCA) 50-25 MG TABS, Take 1 tablet by mouth daily with breakfast. Take with the Prezcobix., Disp: 30 tablet, Rfl: 9 .  HYDROcodone-acetaminophen (NORCO/VICODIN) 5-325 MG tablet, Take 1 tablet by mouth every 6 (six) hours as needed for moderate pain., Disp: 120 tablet, Rfl: 0 .  levothyroxine (SYNTHROID, LEVOTHROID) 175 MCG tablet, Take 1 tablet (175 mcg total) by mouth daily before breakfast., Disp: 90 tablet, Rfl: 3 .  loratadine (CLARITIN) 10 MG tablet, Take 10 mg by mouth daily. Reported on 06/08/2015, Disp: , Rfl:  .  methocarbamol (ROBAXIN) 500 MG tablet, Take 500 mg by mouth every 8 (eight) hours as needed for muscle spasms., Disp: , Rfl:  .  Multiple Vitamins-Minerals (HAIR/SKIN/NAILS) TABS, Take 1 tablet by mouth daily., Disp: , Rfl:  .  olmesartan-hydrochlorothiazide (BENICAR HCT) 40-25 MG tablet, TAKE 1 TABLET BY MOUTH EVERY DAY, Disp: 30 tablet, Rfl: 1 .  Omega-3 Fatty Acids (FISH OIL PO), Take 1 capsule by mouth daily with lunch., Disp: , Rfl:  .  Pitavastatin Calcium 4 MG TABS, Take 1 tablet (4 mg total) by mouth daily. This may be placebo, study provided. Do not dispense., Disp: 30 tablet, Rfl: 11 .  PREZCOBIX 800-150 MG tablet, TAKE 1 TABLET BY MOUTH DAILY. SWALLOW WHOLE. DO NOT CRUSH, BREAK OR CHEW TABLETS.  TAKE WITH FOOD., Disp: 30 tablet, Rfl: 9 .  Probiotic Product (PROBIOTIC PO), Take 1 capsule by mouth daily with lunch., Disp: , Rfl:  .  sulfamethoxazole-trimethoprim (BACTRIM DS,SEPTRA DS) 800-160 MG tablet, Take 1 tablet daily by mouth., Disp: , Rfl: 5   Review of Systems  Constitutional: Negative for activity change, appetite change, chills, diaphoresis, fatigue, fever and unexpected weight change.  HENT: Negative for congestion, postnasal drip, rhinorrhea, sinus pressure, sneezing, sore throat and trouble swallowing.   Eyes: Negative for photophobia and visual disturbance.  Respiratory: Negative for cough, chest tightness, shortness of breath, wheezing and stridor.   Cardiovascular: Negative for chest pain, palpitations and leg swelling.  Gastrointestinal: Negative for abdominal distention, abdominal pain, anal bleeding, blood in stool, constipation, diarrhea, nausea and vomiting.  Genitourinary: Negative for difficulty urinating, dysuria, flank pain and hematuria.  Musculoskeletal: Negative for back pain, gait problem, joint swelling and myalgias.  Skin: Negative for pallor and rash.  Neurological: Negative for dizziness, tremors, weakness and light-headedness.  Hematological: Negative for adenopathy. Does not bruise/bleed easily.  Psychiatric/Behavioral: Negative for agitation, behavioral problems, confusion, decreased concentration, dysphoric mood and sleep disturbance.       Objective:   Physical Exam  Constitutional: She is oriented to person, place, and time. She appears well-developed and well-nourished. No distress.  HENT:  Head: Normocephalic and atraumatic.  Mouth/Throat: Oropharynx is clear and moist. No oropharyngeal exudate.  Eyes: Conjunctivae and EOM are normal. Pupils are equal, round, and reactive to light. No scleral icterus.  Neck: Normal range of motion. Neck supple. No JVD present.  Cardiovascular: Normal rate and regular rhythm.  Pulmonary/Chest: Effort  normal. No respiratory distress. She has no wheezes.  Abdominal: Soft. She exhibits no distension.  Lymphadenopathy:    She has no cervical adenopathy.  Neurological: She is alert and oriented to person, place, and time. She exhibits normal muscle tone. Coordination normal.  Skin: Skin is warm and dry. She is not diaphoretic. No erythema. No pallor.  Psychiatric: She has a normal mood and affect. Her behavior is normal. Judgment and thought content normal.  Vitals reviewed.  Her mastectomy scar is well healed. Porta-cath site is clean       Assessment & Plan:   HIV DISEASE :  Continue Prezcobix and JULUCA WITH FOOD AN AVOIDING PPI, H2 blockers  I discussed again idea of addingin Maraviroc at 194m po bid (due to interaction with COBI) more for effect vs her breast cancer. I doubt that she would have R5 tropic virus, more likely mixed tropic virus but CCR5 antagonism could be beneficial with re to her maligancy    Recurrent breast cancer sp chemotherapy:   She is being monitored for recurrence and recent CT chest abd and pelvis is reassuring  I think adding CCR5 antagonist might theoretically be beneficial to prevent recurrence. There is animal model suggesting that CCR5 antagonists may be able to do this and drug is being used currently in a few clinical trials albeit largely salvage ones. It is generally a very well tolerated medication. It may or may not have role with her HIV virus (we have been studying its effects on CNS reservoir) but it would have absolutely no downside that I can see and might potentially via its immune modulatory effect on macrophages play a role in preventing recurrence of her breast cancer.  Greatly appreciate Dr. MJana Hakim IDalbert Batmantaking [facility] Abnormality of uterine wall with thickening: we will refer to Dr. PKennon Rounds(pt preference for workup of this abnormality)       HYPERTENSION : BP up again today this will need to be more aggressively  attacked again Vitals:   07/01/17 1038  BP: (!) 158/89  Pulse: 70  Temp: (!) 97.5 F (36.4 C)     Morbid obesity: to  try to cut calories, carbohydrates  and to followup with Dr. Dewaine Oats  HYPOTHYROIDISM, POST-RADIATION  Followed by Dr. Loanne Drilling,.   Hyperlipidemia: Lipid Panel     Component Value Date/Time   CHOL 133 03/20/2017 0847   CHOL 191 10/15/2014 1007   TRIG 111 03/20/2017 0847   HDL 59 03/20/2017 0847   HDL 65 10/15/2014 1007   CHOLHDL 2.3 03/20/2017 0847   VLDL 15 11/15/2016 1104   LDLCALC 76 11/15/2016 1104   LDLCALC 99 10/15/2014 1007      CKD:  Lab Results  Component Value Date   CREATININE 1.4 (H) 05/13/2017   CREATININE 1.43 (H) 03/26/2017   CREATININE 1.36 (H) 03/20/2017     I spent greater than 25 minutes with the patient including greater than 50% of time in face to face counsel of the patient er her HIV, and with specific counsel re the theoretical benefits of adding a CCR5 antagonist to her HIV regimen for its effect on macrophages and animal data that suggest it may be protective vs recurrent breast, colon cancer metastases in particular and in coordination of her care.

## 2017-07-04 ENCOUNTER — Telehealth: Payer: Self-pay | Admitting: Infectious Disease

## 2017-07-04 MED ORDER — MARAVIROC 150 MG PO TABS: 150.0000 mg | ORAL_TABLET | Freq: Two times a day (BID) | ORAL | 11 refills | Status: DC

## 2017-07-04 NOTE — Telephone Encounter (Signed)
Dr. Jana Hakim has given his blessing to proceed with maraviroc  I will start 150mg  po BID for Kayla Price given she is on COBI  Can we ask her to start this medicine?  I sent to her mail order pharmacy assumign this is where it should be sent?

## 2017-07-05 ENCOUNTER — Other Ambulatory Visit: Payer: Self-pay | Admitting: Pharmacist

## 2017-07-05 MED FILL — SELZENTRY 150 MG TABS: 150 | 30 days supply | Qty: 60 | Fill #0

## 2017-07-05 NOTE — Telephone Encounter (Signed)
Spoke to patient and she uses [facility] and Rx called to pharmacy. She will call [facility] to let them know she does not want it filled there.

## 2017-07-07 NOTE — Telephone Encounter (Signed)
Ok very good.

## 2017-07-08 MED FILL — JULUCA 50-25 MG TAB: 50-25 | 30 days supply | Qty: 30 | Fill #7

## 2017-07-08 MED FILL — PREZCOBIX 800 MG-150 MG TAB: 800-150 | 30 days supply | Qty: 30 | Fill #1

## 2017-07-29 ENCOUNTER — Encounter (INDEPENDENT_AMBULATORY_CARE_PROVIDER_SITE_OTHER): Payer: Self-pay | Admitting: *Deleted

## 2017-07-29 VITALS — BP 119/86 | HR 65 | Temp 97.6°F | Wt 261.5 lb

## 2017-07-29 DIAGNOSIS — Z006 Encounter for examination for normal comparison and control in clinical research program: Secondary | ICD-10-CM

## 2017-07-29 NOTE — Progress Notes (Signed)
Kayla Price is here for month 20 Reprieve visit. States that she has completed her chemo treatments and "feeling good". 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 No new complaints or concerns. Denies any muscle aches or weakness. Verbalized excellent adherence with medications. Will return in June for next study visit.

## 2017-07-30 ENCOUNTER — Telehealth: Payer: Self-pay | Admitting: *Deleted

## 2017-07-30 ENCOUNTER — Other Ambulatory Visit: Payer: Self-pay

## 2017-07-30 MED ORDER — OLMESARTAN MEDOXOMIL-HCTZ 40-25 MG PO TABS: 1.0000 | ORAL_TABLET | Freq: Every day | ORAL | 1 refills | Status: DC

## 2017-07-30 MED FILL — HYDROCHLOROTHIAZIDE 25 MG T: 25 | 30 days supply | Qty: 30 | Fill #0

## 2017-07-30 MED FILL — OLMESARTAN MEDOXOMIL 40 MG: 40 | 30 days supply | Qty: 30 | Fill #0

## 2017-07-30 NOTE — Telephone Encounter (Signed)
"  [facility] pharmacy to let Dr. Jana Hakim know combination Benicar/HCTZ is not available.  To keep patient from waiting, we can fill them separately if he orders to." Verbal order received and read back from Dr. Jana Hakim for pharmacy to proceed with filling medications separately.  Order given to [place] at this time.

## 2017-07-31 ENCOUNTER — Other Ambulatory Visit: Payer: Self-pay | Admitting: Oncology

## 2017-07-31 ENCOUNTER — Other Ambulatory Visit: Payer: Self-pay

## 2017-08-01 MED FILL — PREZCOBIX 800 MG-150 MG TAB: 800-150 | 30 days supply | Qty: 30 | Fill #2

## 2017-08-01 MED FILL — JULUCA 50-25 MG TAB: 50-25 | 30 days supply | Qty: 30 | Fill #8

## 2017-08-01 MED FILL — SELZENTRY 150 MG TABS: 150 | 30 days supply | Qty: 60 | Fill #1

## 2017-08-05 ENCOUNTER — Encounter: Payer: Self-pay | Admitting: *Deleted

## 2017-08-05 NOTE — Telephone Encounter (Signed)
Patient called and requested refill [place] Confirmed. Pended Rx and sent to [facility] for approval

## 2017-08-06 MED ORDER — HYDROCODONE-ACETAMINOPHEN 5-325 MG PO TABS: 1.0000 | ORAL_TABLET | Freq: Four times a day (QID) | ORAL | 0 refills | Status: DC | PRN

## 2017-08-06 MED FILL — HYDROCODON-APAP 5-325: 5-325 | 30 days supply | Qty: 120 | Fill #0

## 2017-08-06 NOTE — Telephone Encounter (Signed)
This encounter was created in error - please disregard.

## 2017-09-04 MED FILL — SELZENTRY 150 MG TABS: 150 | 30 days supply | Qty: 60 | Fill #2

## 2017-09-10 MED FILL — OLMESARTAN-HCTZ 40-25 MG TA: 40-25 | 30 days supply | Qty: 30 | Fill #0

## 2017-09-20 ENCOUNTER — Ambulatory Visit (INDEPENDENT_AMBULATORY_CARE_PROVIDER_SITE_OTHER): Payer: Medicare HMO

## 2017-09-20 ENCOUNTER — Telehealth: Payer: Self-pay

## 2017-09-20 ENCOUNTER — Telehealth: Payer: Self-pay | Admitting: *Deleted

## 2017-09-20 VITALS — BP 118/70 | HR 77 | Temp 97.6°F | Ht 67.0 in | Wt 271.0 lb

## 2017-09-20 DIAGNOSIS — Z0000 Encounter for general adult medical examination without abnormal findings: Secondary | ICD-10-CM

## 2017-09-20 MED ORDER — TETANUS-DIPHTH-ACELL PERTUSSIS 5-2.5-18.5 LF-MCG/0.5 IM SUSP: 0.5000 mL | Freq: Once | INTRAMUSCULAR | 0 refills | Status: AC

## 2017-09-20 NOTE — Patient Instructions (Signed)
Kayla Price , Thank you for taking time to come for your Medicare Wellness Visit. I appreciate your ongoing commitment to your health goals. Please review the following plan we discussed and let me know if I can assist you in the future.   Screening recommendations/referrals: Colonoscopy due, referral sent Mammogram up to date, due 09/29/2018 Bone Density up to date Recommended yearly ophthalmology/optometry visit for glaucoma screening and checkup Recommended yearly dental visit for hygiene and checkup  Vaccinations: Influenza vaccine up to date, due 2019 fall season Pneumococcal vaccine due at age 75 Tdap vaccine due, prescription sent to pharmacy Shingles vaccine due at age 3    Advanced directives: Advance directive discussed with you today. I have provided a copy for you to complete at home and have notarized. Once this is complete please bring a copy in to our office so we can scan it into your chart.  Conditions/risks identified: none  Next appointment: [name], NP 10/15/2017 @ 10:15am             Tyson Dense, RN 09/22/2018 @ 8:30am  Preventive Care 40-64 Years, Female Preventive care refers to lifestyle choices and visits with your health care provider that can promote health and wellness. What does preventive care include?  A yearly physical exam. This is also called an annual well check.  Dental exams once or twice a year.  Routine eye exams. Ask your health care provider how often you should have your eyes checked.  Personal lifestyle choices, including:  Daily care of your teeth and gums.  Regular physical activity.  Eating a healthy diet.  Avoiding tobacco and drug use.  Limiting alcohol use.  Practicing safe sex.  Taking low-dose aspirin daily starting at age 55.  Taking vitamin and mineral supplements as recommended by your health care provider. What happens during an annual well check? The services and screenings done by your health care  provider during your annual well check will depend on your age, overall health, lifestyle risk factors, and family history of disease. Counseling  Your health care provider may ask you questions about your:  Alcohol use.  Tobacco use.  Drug use.  Emotional well-being.  Home and relationship well-being.  Sexual activity.  Eating habits.  Work and work Statistician.  Method of birth control.  Menstrual cycle.  Pregnancy history. Screening  You may have the following tests or measurements:  Height, weight, and BMI.  Blood pressure.  Lipid and cholesterol levels. These may be checked every 5 years, or more frequently if you are over 2 years old.  Skin check.  Lung cancer screening. You may have this screening every year starting at age 75 if you have a 30-pack-year history of smoking and currently smoke or have quit within the past 15 years.  Fecal occult blood test (FOBT) of the stool. You may have this test every year starting at age 68.  Flexible sigmoidoscopy or colonoscopy. You may have a sigmoidoscopy every 5 years or a colonoscopy every 10 years starting at age 55.  Hepatitis C blood test.  Hepatitis B blood test.  Sexually transmitted disease (STD) testing.  Diabetes screening. This is done by checking your blood sugar (glucose) after you have not eaten for a while (fasting). You may have this done every 1-3 years.  Mammogram. This may be done every 1-2 years. Talk to your health care provider about when you should start having regular mammograms. This may depend on whether you have a family history of breast  cancer.  BRCA-related cancer screening. This may be done if you have a family history of breast, ovarian, tubal, or peritoneal cancers.  Pelvic exam and Pap test. This may be done every 3 years starting at age 79. Starting at age 2, this may be done every 5 years if you have a Pap test in combination with an HPV test.  Bone density scan. This is done  to screen for osteoporosis. You may have this scan if you are at high risk for osteoporosis. Discuss your test results, treatment options, and if necessary, the need for more tests with your health care provider. Vaccines  Your health care provider may recommend certain vaccines, such as:  Influenza vaccine. This is recommended every year.  Tetanus, diphtheria, and acellular pertussis (Tdap, Td) vaccine. You may need a Td booster every 10 years.  Zoster vaccine. You may need this after age 84.  Pneumococcal 13-valent conjugate (PCV13) vaccine. You may need this if you have certain conditions and were not previously vaccinated.  Pneumococcal polysaccharide (PPSV23) vaccine. You may need one or two doses if you smoke cigarettes or if you have certain conditions. Talk to your health care provider about which screenings and vaccines you need and how often you need them. This information is not intended to replace advice given to you by your health care provider. Make sure you discuss any questions you have with your health care provider. Document Released: 07/08/2015 Document Revised: 02/29/2016 Document Reviewed: 04/12/2015 Elsevier Interactive Patient Education  2017 [place] Prevention in the Home Falls can cause injuries. They can happen to people of all ages. There are many things you can do to make your home safe and to help prevent falls. What can I do on the outside of my home?  Regularly fix the edges of walkways and driveways and fix any cracks.  Remove anything that might make you trip as you walk through a door, such as a raised step or threshold.  Trim any bushes or trees on the path to your home.  Use bright outdoor lighting.  Clear any walking paths of anything that might make someone trip, such as rocks or tools.  Regularly check to see if handrails are loose or broken. Make sure that both sides of any steps have handrails.  Any raised decks and porches  should have guardrails on the edges.  Have any leaves, snow, or ice cleared regularly.  Use sand or salt on walking paths during winter.  Clean up any spills in your garage right away. This includes oil or grease spills. What can I do in the bathroom?  Use night lights.  Install grab bars by the toilet and in the tub and shower. Do not use towel bars as grab bars.  Use non-skid mats or decals in the tub or shower.  If you need to sit down in the shower, use a plastic, non-slip stool.  Keep the floor dry. Clean up any water that spills on the floor as soon as it happens.  Remove soap buildup in the tub or shower regularly.  Attach bath mats securely with double-sided non-slip rug tape.  Do not have throw rugs and other things on the floor that can make you trip. What can I do in the bedroom?  Use night lights.  Make sure that you have a light by your bed that is easy to reach.  Do not use any sheets or blankets that are too big for your  bed. They should not hang down onto the floor.  Have a firm chair that has side arms. You can use this for support while you get dressed.  Do not have throw rugs and other things on the floor that can make you trip. What can I do in the kitchen?  Clean up any spills right away.  Avoid walking on wet floors.  Keep items that you use a lot in easy-to-reach places.  If you need to reach something above you, use a strong step stool that has a grab bar.  Keep electrical cords out of the way.  Do not use floor polish or wax that makes floors slippery. If you must use wax, use non-skid floor wax.  Do not have throw rugs and other things on the floor that can make you trip. What can I do with my stairs?  Do not leave any items on the stairs.  Make sure that there are handrails on both sides of the stairs and use them. Fix handrails that are broken or loose. Make sure that handrails are as long as the stairways.  Check any carpeting to  make sure that it is firmly attached to the stairs. Fix any carpet that is loose or worn.  Avoid having throw rugs at the top or bottom of the stairs. If you do have throw rugs, attach them to the floor with carpet tape.  Make sure that you have a light switch at the top of the stairs and the bottom of the stairs. If you do not have them, ask someone to add them for you. What else can I do to help prevent falls?  Wear shoes that:  Do not have high heels.  Have rubber bottoms.  Are comfortable and fit you well.  Are closed at the toe. Do not wear sandals.  If you use a stepladder:  Make sure that it is fully opened. Do not climb a closed stepladder.  Make sure that both sides of the stepladder are locked into place.  Ask someone to hold it for you, if possible.  Clearly mark and make sure that you can see:  Any grab bars or handrails.  First and last steps.  Where the edge of each step is.  Use tools that help you move around (mobility aids) if they are needed. These include:  Canes.  Walkers.  Scooters.  Crutches.  Turn on the lights when you go into a dark area. Replace any light bulbs as soon as they burn out.  Set up your furniture so you have a clear path. Avoid moving your furniture around.  If any of your floors are uneven, fix them.  If there are any pets around you, be aware of where they are.  Review your medicines with your doctor. Some medicines can make you feel dizzy. This can increase your chance of falling. Ask your doctor what other things that you can do to help prevent falls. This information is not intended to replace advice given to you by your health care provider. Make sure you discuss any questions you have with your health care provider. Document Released: 04/07/2009 Document Revised: 11/17/2015 Document Reviewed: 07/16/2014 Elsevier Interactive Patient Education  2017 [employer].

## 2017-09-20 NOTE — Telephone Encounter (Signed)
Called and left vm asking patient where she had her last colonoscopy so that I could order her next one there.

## 2017-09-20 NOTE — Telephone Encounter (Signed)
Faxed ROI to [facility] 09/20/17 @ 10:30am; release id # [id number]

## 2017-09-20 NOTE — Progress Notes (Signed)
Subjective:   LORANE COUSAR is a 62 y.o. female who presents for Medicare Annual (Subsequent) preventive examination.  Last AWV-09/18/2016       Objective:     Vitals: BP 118/70 (BP Location: Left Arm, Patient Position: Sitting)   Pulse 77   Temp 97.6 F (36.4 C) (Oral)   Ht 5' 7" (1.702 m)   Wt 271 lb (122.9 kg)   LMP 11/06/2003   SpO2 98%   BMI 42.44 kg/m   Body mass index is 42.44 kg/m.  Advanced Directives 09/20/2017 03/26/2017 12/20/2016 11/05/2016 11/05/2016 10/12/2016 09/20/2016  Does Patient Have a Medical Advance Directive? No Yes No Yes Yes Yes;No Yes  Type of Advance Directive - [place];Living will - [place];Living will [place];Living will - [place];Living will  Does patient want to make changes to medical advance directive? - Yes (MAU/Ambulatory/Procedural Areas - Information given) - No - Patient declined - Yes (MAU/Ambulatory/Procedural Areas - Information given) -  Copy of [place] in Chart? - (No Data) - No - copy requested No - copy requested - No - copy requested  Would patient like information on creating a medical advance directive? Yes (MAU/Ambulatory/Procedural Areas - Information given) - No - Patient declined - - - -    Tobacco Social History   Tobacco Use  Smoking Status Never Smoker  Smokeless Tobacco Never Used     Counseling given: Not Answered   Clinical Intake:  Pre-visit preparation completed: No  Pain : No/denies pain     Nutritional Risks: None Diabetes: No  What is the last grade level you completed in school?: Bachelors  Interpreter Needed?: No  Information entered by :: Tyson Dense, RN  Past Medical History:  Diagnosis Date  . Alopecia areata 11/28/2009  . Bell's palsy   . Breast cancer ([place])   . BREAST CANCER, HX OF 2005  . Cancer [facility]) 2005   Breast cancer   chemotherapy and radiation  . CKD (chronic kidney  disease) stage 3, GFR 30-59 ml/min (HCC) 06/08/2015  . Dry eye syndrome   . Family history of lung cancer   . Family history of non-Hodgkin's lymphoma   . Fasting hyperglycemia   . HIP FRACTURE, RIGHT 05/06/2008  . History of kidney stones   . HIV (human immunodeficiency virus infection) ([place])   . HIV DISEASE 03/27/2006  . HYPERLIPIDEMIA, MIXED 12/15/2007  . HYPERTENSION 03/27/2006  . 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 MENORRHAGIA, POSTMENOPAUSAL 02/03/2009  . Osteoarthritis of left knee 06/08/2015  . OSTEOARTHROSIS, LOCAL, SCND, UNSPC SITE 04/07/2007  . PVD 04/07/2007  . Tinea capitis   . TRIGGER FINGER 05/06/2008  . Unspecified vitamin D deficiency 08/06/2007   Past Surgical History:  Procedure Laterality Date  . BREAST LUMPECTOMY Right    2005  . COLONOSCOPY    . COLONOSCOPY WITH ESOPHAGOGASTRODUODENOSCOPY (EGD)    . HYSTEROSCOPY  2006  . KNEE ARTHROSCOPY Left 2002  . LYMPH NODE DISSECTION  2005  . MASTECTOMY MODIFIED RADICAL Right 11/05/2016   Procedure: RIGHT MASTECTOMY MODIFIED RADICAL;  Surgeon: Fanny Skates, MD;  Location: [facility];  Service: General;  Laterality: Right;  . MODIFIED RADICAL MASTECTOMY Right 11/05/2016  . placement   of port-a-cath    . PORT-A-CATH REMOVAL  2006   insertion 2005  . PORT-A-CATH REMOVAL N/A 03/29/2017   Procedure: REMOVAL PORT-A-CATH;  Surgeon: Fanny Skates, MD;  Location: [place];  Service: General;  Laterality: N/A;  .  PORTACATH PLACEMENT Right 11/05/2016   Procedure: INSERTION PORT-A-CATH WITH ULTRA SOUND;  Surgeon: Fanny Skates, MD;  Location: [facility];  Service: General;  Laterality: Right;  . removal of port a cath  12/2014  . THYROID SURGERY  2009   Ablation   . TOTAL KNEE ARTHROPLASTY Left 02/06/2016   Procedure: TOTAL KNEE ARTHROPLASTY;  Surgeon: Frederik Pear, MD;  Location: [facility];  Service: Orthopedics;  Laterality: Left;  . TUBAL LIGATION     Family History  Problem Relation Age of Onset  . Heart attack Brother         Massive MI in 41s  . Stroke Brother        CAD  . Kidney disease Mother   . Stroke Mother   . Diabetes Mother   . Liver disease Sister   . COPD Sister        had I-131 rx of hyperthyroidism  . Diabetes Sister   . Stroke Sister   . Lung cancer Paternal Uncle        hx smoking  . Cancer Cousin   . Non-Hodgkin's lymphoma Cousin 25       cancer x3, in prostate and lung- unsure if met/spread or if primaries  . Heart failure Father   . Heart disease Father   . Arthritis Father   . Sarcoidosis Sister    Social History   Socioeconomic History  . Marital status: Widowed    Spouse name: Not on file  . Number of children: Not on file  . Years of education: Not on file  . Highest education level: Not on file  Occupational History  . Occupation: Paediatric nurse: UNEMPLOYED  Social Needs  . Financial resource strain: Not hard at all  . Food insecurity:    Worry: Never true    Inability: Never true  . Transportation needs:    Medical: No    Non-medical: No  Tobacco Use  . Smoking status: Never Smoker  . Smokeless tobacco: Never Used  Substance and Sexual Activity  . Alcohol use: No  . Drug use: No  . Sexual activity: Not Currently    Birth control/protection: Post-menopausal    Comment: declined condoms  Lifestyle  . Physical activity:    Days per week: 3 days    Minutes per session: 120 min  . Stress: Not at all  Relationships  . Social connections:    Talks on phone: More than three times a week    Gets together: More than three times a week    Attends religious service: More than 4 times per year    Active member of club or organization: No    Attends meetings of clubs or organizations: Never    Relationship status: Widowed  Other Topics Concern  . Not on file  Social History Narrative   Single/widow   Diet: good   Do you drink/eat things with caffeine? Tea occasionally   Marital status: Widowed  What year were you married? 1993   Do you live in a  house, apartment,assisted living, condo,trailer,ect.)? House   Is it one or more stories? Two   How many persons live in your home? 4   Do you have any pets in you home? No   Current or past profession: Tour manager   Do you exercise? Yes  Type&how often: Stationary Bike, Water Exercise 3-4 x week   Do you have a living will? Yes   Do you have a  DNR form? No  If not do you what one?   Do you have signed POA/HPOA forms? No   If so, please bring to your appointment.                Outpatient Encounter Medications as of 09/20/2017  Medication Sig  . CALCIUM-VITAMIN D PO Take 1 tablet by mouth daily.  . Cholecalciferol (VITAMIN D3) 2000 units TABS Take 2,000 Units by mouth daily with lunch.  . Dolutegravir-Rilpivirine (JULUCA) 50-25 MG TABS Take 1 tablet by mouth daily with breakfast. Take with the Prezcobix.  Marland Kitchen HYDROcodone-acetaminophen (NORCO/VICODIN) 5-325 MG tablet Take 1 tablet by mouth every 6 (six) hours as needed for moderate pain.  Marland Kitchen levothyroxine (SYNTHROID, LEVOTHROID) 175 MCG tablet Take 1 tablet (175 mcg total) by mouth daily before breakfast.  . loratadine (CLARITIN) 10 MG tablet Take 10 mg by mouth daily. Reported on 06/08/2015  . maraviroc (SELZENTRY) 150 MG tablet Take 1 tablet (150 mg total) by mouth 2 (two) times daily.  . methocarbamol (ROBAXIN) 500 MG tablet Take 500 mg by mouth every 8 (eight) hours as needed for muscle spasms.  . Multiple Vitamins-Minerals (HAIR/SKIN/NAILS) TABS Take 1 tablet by mouth daily.  Marland Kitchen olmesartan-hydrochlorothiazide (BENICAR HCT) 40-25 MG tablet Take 1 tablet by mouth daily.  . Omega-3 Fatty Acids (FISH OIL PO) Take 1 capsule by mouth daily with lunch.  . Pitavastatin Calcium 4 MG TABS Take 1 tablet (4 mg total) by mouth daily. This may be placebo, study provided. Do not dispense.  Marland Kitchen PREZCOBIX 800-150 MG tablet TAKE 1 TABLET BY MOUTH DAILY. SWALLOW WHOLE. DO NOT CRUSH, BREAK OR CHEW TABLETS. TAKE WITH FOOD.  . Probiotic Product (PROBIOTIC PO)  Take 1 capsule by mouth daily with lunch.  . Tdap (BOOSTRIX) 5-2.5-18.5 LF-MCG/0.5 injection Inject 0.5 mLs into the muscle once.  . [DISCONTINUED] sulfamethoxazole-trimethoprim (BACTRIM DS,SEPTRA DS) 800-160 MG tablet Take 1 tablet daily by mouth.   No facility-administered encounter medications on file as of 09/20/2017.     Activities of Daily Living In your present state of health, do you have any difficulty performing the following activities: 09/20/2017 03/26/2017  Hearing? N N  Vision? N N  Difficulty concentrating or making decisions? N N  Walking or climbing stairs? N N  Dressing or bathing? N N  Doing errands, shopping? N N  Preparing Food and eating ? N -  Using the Toilet? N -  In the past six months, have you accidently leaked urine? N -  Do you have problems with loss of bowel control? N -  Managing your Medications? N -  Managing your Finances? N -  Housekeeping or managing your Housekeeping? N -  Some recent data might be hidden    Patient Care Team: Lauree Chandler, NP as PCP - General (Nurse Practitioner) Tommy Medal, Lavell Islam, MD as PCP - Infectious Diseases (Infectious Diseases) Clent Jacks, MD as Consulting Physician (Ophthalmology) Renato Shin, MD as Consulting Physician (Endocrinology) Frederik Pear, MD as Consulting Physician (Orthopedic Surgery) Magrinat, Virgie Dad, MD as Consulting Physician (Oncology) Fanny Skates, MD as Consulting Physician (General Surgery) Donnamae Jude, MD as Consulting Physician (Obstetrics and Gynecology) Delice Bison Charlestine Massed, NP as Nurse Practitioner (Hematology and Oncology)    Assessment:   This is a routine wellness examination for [facility].  Exercise Activities and Dietary recommendations Current Exercise Habits: Home exercise routine, Type of exercise: treadmill, Time (Minutes): > 60, Frequency (Times/Week): 3, Weekly Exercise (Minutes/Week): 0, Exercise limited by: None identified  Goals    . Exercise 3x per  week (30 min per time)     Starting 09/18/16 I will continue my days at the gym and try to lose weight.     . Weight (lb) < 200 lb (90.7 kg)       Fall Risk Fall Risk  09/20/2017 07/01/2017 03/06/2017 11/26/2016 10/12/2016  Falls in the past year? Yes No Yes No No  Number falls in past yr: 2 or more - 1 - -  Injury with Fall? No - Yes - -  Comment - - - - -  Risk for fall due to : - - - - -  Risk for fall due to: Comment - - - - -  Follow up - - - - -   Is the patient's home free of loose throw rugs in walkways, pet beds, electrical cords, etc?   yes      Grab bars in the bathroom? no      Handrails on the stairs?   yes      Adequate lighting?   yes  Timed Get Up and Go performed: 18 seconds, fall risk  Depression Screen PHQ 2/9 Scores 09/20/2017 07/01/2017 03/06/2017 11/26/2016  PHQ - 2 Score 0 0 0 0     Cognitive Function within normal function MMSE - Mini Mental State Exam 09/18/2016  Not completed: Unable to complete        Immunization History  Administered Date(s) Administered  . Hepatitis B 07/11/1992, 11/17/2001, 03/02/2002, 04/09/2011  . Influenza Split 04/10/2011, 03/05/2012  . Influenza Whole 03/27/2006, 04/07/2007, 05/06/2008, 05/16/2009, 09/27/2010  . Influenza,inj,Quad PF,6+ Mos 03/09/2013, 04/23/2014, 03/22/2016  . Influenza-Unspecified 06/08/2015, 03/06/2017  . Pneumococcal Polysaccharide-23 03/27/2006, 02/27/2012    Qualifies for Shingles Vaccine? Yes, educated and declined for now  Screening Tests Health Maintenance  Topic Date Due  . Samul Dada  01[birth year]07/1975  . MAMMOGRAM  09/26/2018  . Fecal DNA (Cologuard)  09/27/2019  . PAP SMEAR  12/26/2019  . INFLUENZA VACCINE  Completed  . Hepatitis C Screening  Completed  . HIV Screening  Completed    Cancer Screenings: Lung: Low Dose CT Chest recommended if Age 64-80 years, 30 pack-year currently smoking OR have quit w/in 15years. Patient does not qualify. Breast:  Up to date on Mammogram? Yes   Up to date of  Bone Density/Dexa? Yes Colorectal: due, will order when she calls back with location of previous procedure  Additional Screenings:  Hepatitis C Screening: declined TDAP due, prescription sent to pharmacy     Plan:    I have personally reviewed and addressed the Medicare Annual Wellness questionnaire and have noted the following in the patient's chart:  A. Medical and social history B. Use of alcohol, tobacco or illicit drugs  C. Current medications and supplements D. Functional ability and status E.  Nutritional status F.  Physical activity G. Advance directives H. List of other physicians I.  Hospitalizations, surgeries, and ER visits in previous 12 months J.  [place] to include hearing, vision, cognitive, depression L. Referrals and appointments - none  In addition, I have reviewed and discussed with patient certain preventive protocols, quality metrics, and best practice recommendations. A written personalized care plan for preventive services as well as general preventive health recommendations were provided to patient.  See attached scanned questionnaire for additional information.   Signed,   Tyson Dense, RN Nurse Health Advisor  Patient Concerns: None

## 2017-09-23 ENCOUNTER — Telehealth: Payer: Self-pay

## 2017-09-23 NOTE — Telephone Encounter (Signed)
Called and left vm asking where patient had last colonoscopy so that I can refer her there again for her next one

## 2017-09-24 MED FILL — JULUCA 50-25 MG TAB: 50-25 | 30 days supply | Qty: 30 | Fill #9

## 2017-09-24 MED FILL — PREZCOBIX 800 MG-150 MG TAB: 800-150 | 30 days supply | Qty: 30 | Fill #3

## 2017-10-03 MED FILL — SELZENTRY 150 MG TABS: 150 | 30 days supply | Qty: 60 | Fill #3

## 2017-10-08 MED FILL — OLMESARTAN-HCTZ 40-25 MG TA: 40-25 | 30 days supply | Qty: 30 | Fill #1

## 2017-10-15 ENCOUNTER — Other Ambulatory Visit: Payer: Self-pay | Admitting: Infectious Disease

## 2017-10-15 ENCOUNTER — Ambulatory Visit (INDEPENDENT_AMBULATORY_CARE_PROVIDER_SITE_OTHER): Payer: Medicare HMO | Admitting: Nurse Practitioner

## 2017-10-15 ENCOUNTER — Encounter: Payer: Self-pay | Admitting: Nurse Practitioner

## 2017-10-15 VITALS — BP 120/82 | HR 73 | Temp 98.4°F | Ht 67.0 in | Wt 274.0 lb

## 2017-10-15 DIAGNOSIS — M1990 Unspecified osteoarthritis, unspecified site: Secondary | ICD-10-CM

## 2017-10-15 DIAGNOSIS — I10 Essential (primary) hypertension: Secondary | ICD-10-CM

## 2017-10-15 DIAGNOSIS — B20 Human immunodeficiency virus [HIV] disease: Secondary | ICD-10-CM

## 2017-10-15 DIAGNOSIS — N183 Chronic kidney disease, stage 3 unspecified: Secondary | ICD-10-CM

## 2017-10-15 DIAGNOSIS — M6530 Trigger finger, unspecified finger: Secondary | ICD-10-CM

## 2017-10-15 DIAGNOSIS — E890 Postprocedural hypothyroidism: Secondary | ICD-10-CM

## 2017-10-15 DIAGNOSIS — E782 Mixed hyperlipidemia: Secondary | ICD-10-CM

## 2017-10-15 LAB — COMPLETE METABOLIC PANEL WITH GFR
AG Ratio: 1.1 (calc) (ref 1.0–2.5)
ALKALINE PHOSPHATASE (APISO): 92 U/L (ref 33–130)
ALT: 10 U/L (ref 6–29)
AST: 13 U/L (ref 10–35)
Albumin: 3.8 g/dL (ref 3.6–5.1)
BUN/Creatinine Ratio: 14 (calc) (ref 6–22)
BUN: 21 mg/dL (ref 7–25)
CO2: 34 mmol/L — ABNORMAL HIGH (ref 20–32)
Calcium: 10.3 mg/dL (ref 8.6–10.4)
Chloride: 104 mmol/L (ref 98–110)
Creat: 1.47 mg/dL — ABNORMAL HIGH (ref 0.50–0.99)
GFR, Est African American: 44 mL/min/{1.73_m2} — ABNORMAL LOW (ref >=60)
GFR, Est Non African American: 38 mL/min/{1.73_m2} — ABNORMAL LOW (ref >=60)
GLUCOSE: 95 mg/dL (ref 65–139)
Globulin: 3.4 g/dL (calc) (ref 1.9–3.7)
Potassium: 4.3 mmol/L (ref 3.5–5.3)
Sodium: 144 mmol/L (ref 135–146)
TOTAL PROTEIN: 7.2 g/dL (ref 6.1–8.1)
Total Bilirubin: 0.4 mg/dL (ref 0.2–1.2)

## 2017-10-15 LAB — CBC WITH DIFFERENTIAL/PLATELET
Basophils Absolute: 18 cells/uL (ref 0–200)
Basophils Relative: 0.4 %
EOS PCT: 1.5 %
Eosinophils Absolute: 69 cells/uL (ref 15–500)
HEMATOCRIT: 40.4 % (ref 35.0–45.0)
HEMOGLOBIN: 14 g/dL (ref 11.7–15.5)
Lymphs Abs: 2194 cells/uL (ref 850–3900)
MCH: 33 pg (ref 27.0–33.0)
MCHC: 34.7 g/dL (ref 32.0–36.0)
MCV: 95.3 fL (ref 80.0–100.0)
MPV: 9.5 fL (ref 7.5–12.5)
Monocytes Relative: 9.7 %
NEUTROS ABS: 1872 {cells}/uL (ref 1500–7800)
Neutrophils Relative %: 40.7 %
Platelets: 172 10*3/uL (ref 140–400)
RBC: 4.24 10*6/uL (ref 3.80–5.10)
RDW: 12.9 % (ref 11.0–15.0)
Total Lymphocyte: 47.7 %
WBC mixed population: 446 cells/uL (ref 200–950)
WBC: 4.6 10*3/uL (ref 3.8–10.8)

## 2017-10-15 MED ORDER — TETANUS-DIPHTH-ACELL PERTUSSIS 5-2.5-18.5 LF-MCG/0.5 IM SUSP: 0.5000 mL | Freq: Once | INTRAMUSCULAR | 0 refills | Status: AC

## 2017-10-15 MED ORDER — HYDROCODONE-ACETAMINOPHEN 5-325 MG PO TABS: 1.0000 | ORAL_TABLET | Freq: Four times a day (QID) | ORAL | 0 refills | Status: DC | PRN

## 2017-10-15 MED FILL — HYDROCODON-APAP 5-325: 5-325 | 15 days supply | Qty: 60 | Fill #0

## 2017-10-15 NOTE — Progress Notes (Signed)
Careteam: Patient Care Team: Lauree Chandler, NP as PCP - General (Nurse Practitioner) Tommy Medal, Lavell Islam, MD as PCP - Infectious Diseases (Infectious Diseases) Clent Jacks, MD as Consulting Physician (Ophthalmology) Renato Shin, MD as Consulting Physician (Endocrinology) Frederik Pear, MD as Consulting Physician (Orthopedic Surgery) Magrinat, Virgie Dad, MD as Consulting Physician (Oncology) Fanny Skates, MD as Consulting Physician (General Surgery) Donnamae Jude, MD as Consulting Physician (Obstetrics and Gynecology) Delice Bison Charlestine Massed, NP as Nurse Practitioner (Hematology and Oncology)  Advanced Directive information    Allergies  Allergen Reactions  . Lisinopril Anaphylaxis and Swelling    Swelling of tongue and mouth 11/05/16- tolerates Olmesartan  . Pepcid [Famotidine] Other (See Comments)    PPI H2, BLOCKERS LOWER GASTRIC PH WHICH WOULD LEAD TO SUBTHERAPEUTIC RILPIVIRINE LEVELS AND POTENTIAL VIROLOGICAL FAILURE WITH RESISTANCE  . Prilosec [Omeprazole] Other (See Comments)    PPI H2, BLOCKERS LOWER GASTRIC PH WHICH WOULD LEAD TO SUBTHERAPEUTIC RILPIVIRINE LEVELS AND POTENTIAL VIROLOGICAL FAILURE WITH RESISTANCE  . Tums [Calcium Carbonate Antacid] Other (See Comments)    TUMS ANTACIDS CAN LOWER GASTRIC PH WHICH COULD  LEAD TO SUBTHERAPEUTIC RILPIVIRINE LEVELS AND POTENTIAL VIROLOGICAL FAILURE WITH RESISTANCE TUMS CAN BE GIVEN BUT NEED CONSULT WITH ID PHARMACY RE TIMING. I PREFER HER TO AVOID ALL TOGETHER    Chief Complaint  Patient presents with  . Medical Management of Chronic Issues    Pt is being seen for a 6 month routine visit. Pt would like ortho referral for trigger finger in left thumb  . Medication Refill    Rx for hydrocodone pended. [place] database was verified.      HPI: Patient is a 62 y.o. female seen in the office today for routine follow up.  Pt with hx of HIV, right breast CA s/p mastectomy, HTN, hyperlipidemia, hypothyroid followed by Dr  Loanne Drilling, PVD, hyperglycemia, CKD.  Reports she had a bad cases of sinusitis, "toughted it out" better now and Started feeling pulsing in her left head and ear, currently resolved.   Has developed trigger finger in left thumb, has a hx of this in her right, got 1 steroid injection that resolved this over 15 years ago, unsure who she saw for this.  Would like referral to orthopedic for injection.   HIV- stable, continue to follow up with ID every 6 months.   Hx of breast cancer- completed treatment last September, did genetic counseling and testing which did not show any link in DNA  Hypothyroid- recent US which showed Heterogeneous, atrophic thyroid tissue  Following with GYN- had PAP on 12/25/16  htn- controlled, take bp at home without elevated reading.   OA- continues on hydrocodone, takes rarely. Tylenol not effective   [facility] planning to start getting back in the pool  Has not been eating well since she was working. Gained 20 lbs since January.   ACP- has information we provided in office, still looking for her own but these are out of date- plans to get them updated. .   Review of Systems:  Review of Systems  Constitutional: Negative for chills, fever and weight loss.  HENT: Negative for tinnitus.   Respiratory: Negative for cough, sputum production and shortness of breath.   Cardiovascular: Negative for chest pain, palpitations and leg swelling.  Gastrointestinal: Negative for abdominal pain, constipation, diarrhea and heartburn.  Genitourinary: Negative for dysuria, frequency and urgency.  Musculoskeletal: Positive for joint pain (occasionally). Negative for back pain, falls  and myalgias.  Skin: Negative.   Neurological: Negative for dizziness and headaches.  Endo/Heme/Allergies: Positive for environmental allergies.  Psychiatric/Behavioral: Negative for depression and memory loss. The patient does not have insomnia.     Past Medical  History:  Diagnosis Date  . Alopecia areata 11/28/2009  . Bell's palsy   . Breast cancer ([place])   . BREAST CANCER, HX OF 2005  . Cancer [facility]) 2005   Breast cancer   chemotherapy and radiation  . CKD (chronic kidney disease) stage 3, GFR 30-59 ml/min (HCC) 06/08/2015  . Dry eye syndrome   . Family history of lung cancer   . Family history of non-Hodgkin's lymphoma   . Fasting hyperglycemia   . HIP FRACTURE, RIGHT 05/06/2008  . History of kidney stones   . HIV (human immunodeficiency virus infection) ([place])   . HIV DISEASE 03/27/2006  . HYPERLIPIDEMIA, MIXED 12/15/2007  . HYPERTENSION 03/27/2006  . 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 MENORRHAGIA, POSTMENOPAUSAL 02/03/2009  . Osteoarthritis of left knee 06/08/2015  . OSTEOARTHROSIS, LOCAL, SCND, UNSPC SITE 04/07/2007  . PVD 04/07/2007  . Tinea capitis   . TRIGGER FINGER 05/06/2008  . Unspecified vitamin D deficiency 08/06/2007   Past Surgical History:  Procedure Laterality Date  . BREAST LUMPECTOMY Right    2005  . COLONOSCOPY    . COLONOSCOPY WITH ESOPHAGOGASTRODUODENOSCOPY (EGD)    . HYSTEROSCOPY  2006  . KNEE ARTHROSCOPY Left 2002  . LYMPH NODE DISSECTION  2005  . MASTECTOMY MODIFIED RADICAL Right 11/05/2016   Procedure: RIGHT MASTECTOMY MODIFIED RADICAL;  Surgeon: Fanny Skates, MD;  Location: [place];  Service: General;  Laterality: Right;  . MODIFIED RADICAL MASTECTOMY Right 11/05/2016  . placement   of port-a-cath    . PORT-A-CATH REMOVAL  2006   insertion 2005  . PORT-A-CATH REMOVAL N/A 03/29/2017   Procedure: REMOVAL PORT-A-CATH;  Surgeon: Fanny Skates, MD;  Location: [place];  Service: General;  Laterality: N/A;  . PORTACATH PLACEMENT Right 11/05/2016   Procedure: INSERTION PORT-A-CATH WITH ULTRA SOUND;  Surgeon: Fanny Skates, MD;  Location: [place];  Service: General;  Laterality: Right;  . removal of port a cath  12/2014  . THYROID SURGERY  2009   Ablation   . TOTAL KNEE ARTHROPLASTY Left 02/06/2016    Procedure: TOTAL KNEE ARTHROPLASTY;  Surgeon: Frederik Pear, MD;  Location: [place];  Service: Orthopedics;  Laterality: Left;  . TUBAL LIGATION     Social History:   reports that she has never smoked. She has never used smokeless tobacco. She reports that she does not drink alcohol or use drugs.  Family History  Problem Relation Age of Onset  . Heart attack Brother        Massive MI in 36s  . Stroke Brother        CAD  . Kidney disease Mother   . Stroke Mother   . Diabetes Mother   . Liver disease Sister   . COPD Sister        had I-131 rx of hyperthyroidism  . Diabetes Sister   . Stroke Sister   . Lung cancer Paternal Uncle        hx smoking  . Cancer Cousin   . Non-Hodgkin's lymphoma Cousin 25       cancer x3, in prostate and lung- unsure if met/spread or if primaries  . Heart failure Father   . Heart disease Father   . Arthritis Father   . Sarcoidosis Sister     Medications:  Patient's Medications  New Prescriptions   No medications on file  Previous Medications   CALCIUM-VITAMIN D PO    Take 1 tablet by mouth daily.   CHOLECALCIFEROL (VITAMIN D3) 2000 UNITS TABS    Take 2,000 Units by mouth daily with lunch.   DOLUTEGRAVIR-RILPIVIRINE (JULUCA) 50-25 MG TABS    Take 1 tablet by mouth daily with breakfast. Take with the Prezcobix.   HYDROCODONE-ACETAMINOPHEN (NORCO/VICODIN) 5-325 MG TABLET    Take 1 tablet by mouth every 6 (six) hours as needed for moderate pain.   LEVOTHYROXINE (SYNTHROID, LEVOTHROID) 175 MCG TABLET    Take 1 tablet (175 mcg total) by mouth daily before breakfast.   LORATADINE (CLARITIN) 10 MG TABLET    Take 10 mg by mouth daily. Reported on 06/08/2015   MARAVIROC (SELZENTRY) 150 MG TABLET    Take 1 tablet (150 mg total) by mouth 2 (two) times daily.   METHOCARBAMOL (ROBAXIN) 500 MG TABLET    Take 500 mg by mouth every 8 (eight) hours as needed for muscle spasms.   MULTIPLE VITAMINS-MINERALS (HAIR/SKIN/NAILS) TABS    Take 1 tablet by mouth daily.    OLMESARTAN-HYDROCHLOROTHIAZIDE (BENICAR HCT) 40-25 MG TABLET    Take 1 tablet by mouth daily.   OMEGA-3 FATTY ACIDS (FISH OIL PO)    Take 1 capsule by mouth daily with lunch.   PITAVASTATIN CALCIUM 4 MG TABS    Take 1 tablet (4 mg total) by mouth daily. This may be placebo, study provided. Do not dispense.   PREZCOBIX 800-150 MG TABLET    TAKE 1 TABLET BY MOUTH DAILY. SWALLOW WHOLE. DO NOT CRUSH, BREAK OR CHEW TABLETS. TAKE WITH FOOD.   PROBIOTIC PRODUCT (PROBIOTIC PO)    Take 1 capsule by mouth daily with lunch.  Modified Medications   Modified Medication Previous Medication   TDAP (BOOSTRIX) 5-2.5-18.5 LF-MCG/0.5 INJECTION Tdap (BOOSTRIX) 5-2.5-18.5 LF-MCG/0.5 injection      Inject 0.5 mLs into the muscle once for 1 dose.    Inject 0.5 mLs into the muscle once.  Discontinued Medications   No medications on file     Physical Exam:  Vitals:   10/15/17 0923  BP: 120/82  Pulse: 73  Temp: 98.4 F (36.9 C)  TempSrc: Oral  SpO2: 99%  Weight: 274 lb (124.3 kg)  Height: '5\' 7"'  (1.702 m)   Body mass index is 42.91 kg/m.  Physical Exam  Constitutional: She is oriented to person, place, and time. She appears well-developed and well-nourished. No distress.  HENT:  Head: Normocephalic and atraumatic.  Mouth/Throat: Oropharynx is clear and moist. No oropharyngeal exudate.  Eyes: Pupils are equal, round, and reactive to light. Conjunctivae are normal.  Neck: Normal range of motion. Neck supple.  Cardiovascular: Normal rate, regular rhythm and normal heart sounds.  Pulmonary/Chest: Effort normal and breath sounds normal.  Abdominal: Soft. Bowel sounds are normal.  Musculoskeletal: She exhibits no edema or tenderness.  Tenderness noted to  PIP joint in thumb to left hand with decrease ROM   Neurological: She is alert and oriented to person, place, and time.  Skin: Skin is warm and dry. She is not diaphoretic.  Psychiatric: She has a normal mood and affect.    Labs reviewed: Basic  Metabolic Panel: Recent Labs    11/12/16 0900  03/07/17 0842  03/20/17 0847 03/26/17 0900 05/13/17 0927  NA 143   < >  --    < > 140 138 140  K 4.0   < >  --    < >  4.0 3.7 4.1  CL 103  --   --   --  103 103  --   CO2 30   < >  --    < > '29 28 29  ' GLUCOSE 87   < >  --    < > 66 84 102  BUN 20   < >  --    < > 20 11 17.7  CREATININE 1.41*   < >  --    < > 1.36* 1.43* 1.4*  CALCIUM 10.4   < >  --    < > 9.2 9.8 10.5*  TSH  --   --  2.45  --   --   --   --    < > = values in this interval not displayed.   Liver Function Tests: Recent Labs    03/04/17 1053 03/11/17 1123 03/20/17 0847 05/13/17 0927  AST '22 14 15 14  ' ALT '23 15 15 12  ' ALKPHOS 114 123  --  90  BILITOT 0.28 <0.22 0.2 0.27  PROT 7.4 6.9 6.4 8.1  ALBUMIN 3.7 3.4*  --  3.5   No results for input(s): LIPASE, AMYLASE in the last 8760 hours. No results for input(s): AMMONIA in the last 8760 hours. CBC: Recent Labs    03/11/17 1123 03/20/17 0847 03/26/17 0900 05/13/17 0924  WBC 6.1 11.2* 8.1 3.3*  NEUTROABS 3.0 8,120*  --  1.7  HGB 8.5* 7.6* 8.4* 13.1  HCT 25.4* 22.8* 26.3* 40.3  MCV 113.7* 111.8* 116.4* 100.6  PLT 171 26* 183 158   Lipid Panel: Recent Labs    11/15/16 1104 03/20/17 0847  CHOL 145 133  HDL 54 59  LDLCALC 76 55  TRIG 77 111  CHOLHDL 2.7 2.3   TSH: Recent Labs    03/07/17 0842  TSH 2.45   A1C: Lab Results  Component Value Date   HGBA1C 5.2 05/28/2016     Assessment/Plan 1. Essential hypertension -stable at this time, to continue current regimen, DASH diet  - COMPLETE METABOLIC PANEL WITH GFR - CBC with Differential/Platelets  2. Postablative hypothyroidism Continues to follow with endocrinology  3. Trigger finger, acquired - Ambulatory referral to Orthopedics  4. CKD (chronic kidney disease) stage 3, GFR 30-59 ml/min (HCC) Encourage proper hydration and to avoid NSAIDS (Aleve, Advil, Motrin, Ibuprofen) will follow up lab today  5. Human immunodeficiency virus  (HIV) disease (HCC) Added maraciroc 150 mg BID twice daily in January. Continues on dolutegravir-rilpivirine daily   6. HYPERLIPIDEMIA, MIXED -following in a study due to HIV and hyperlipidemia, may be getting actual drug or placebo however LDL at goal on recent labs.   7. Osteoarthritis, unspecified osteoarthritis type, unspecified site Stable, overall improved but occasionally needs hydrocodone, tylenol not effective.  - HYDROcodone-acetaminophen (NORCO/VICODIN) 5-325 MG tablet; Take 1 tablet by mouth every 6 (six) hours as needed for moderate pain.  Dispense: 60 tablet; Refill: 0  8. Obesities, morbid ([place]) Had been doing well with weight loss until January and started working more and was not eating correctly. Continues to go to gym but work outs were not as extensive. Plans to get back into routine with healthy eating and increase in activity.   Next appt: 6 months, sooner if needed  Inella Kuwahara K. Gregg, [facility] [phone]

## 2017-10-17 MED FILL — JULUCA 50-25 MG TAB: 50-25 | 30 days supply | Qty: 30 | Fill #0

## 2017-10-17 MED FILL — PREZCOBIX 800 MG-150 MG TAB: 800-150 | 30 days supply | Qty: 30 | Fill #4

## 2017-10-22 ENCOUNTER — Ambulatory Visit (INDEPENDENT_AMBULATORY_CARE_PROVIDER_SITE_OTHER): Payer: Medicare HMO | Admitting: Orthopaedic Surgery

## 2017-10-22 ENCOUNTER — Encounter (INDEPENDENT_AMBULATORY_CARE_PROVIDER_SITE_OTHER): Payer: Self-pay | Admitting: Orthopaedic Surgery

## 2017-10-22 VITALS — BP 123/79 | HR 82 | Resp 16 | Ht 67.5 in | Wt 270.0 lb

## 2017-10-22 DIAGNOSIS — M65312 Trigger thumb, left thumb: Secondary | ICD-10-CM | POA: Diagnosis not present

## 2017-10-22 MED ORDER — METHYLPREDNISOLONE ACETATE 40 MG/ML IJ SUSP
20.0000 mg | INTRAMUSCULAR | Status: AC | PRN
  Administered 2017-10-22: 20 mg

## 2017-10-22 MED ORDER — LIDOCAINE HCL 1 % IJ SOLN
1.0000 mL | INTRAMUSCULAR | Status: AC | PRN
  Administered 2017-10-22: 1 mL

## 2017-10-22 NOTE — Progress Notes (Signed)
Office Visit Note   Patient: Kayla Price           Date of Birth: 1956-05-16           MRN: [id number] Visit Date: 10/22/2017              Requested by: Lauree Chandler, NP [place], [place] [address] PCP: Lauree Chandler, NP   Assessment & Plan: Visit Diagnoses:  1. Trigger thumb, left thumb     Plan: 2-week history of active triggering left thumb.  Has prior history of right trigger thumb treated with cortisone injection with complete relief.  Will inject with cortisone and hope that she does as well as she did on the right  Follow-Up Instructions: Return if symptoms worsen or fail to improve.   Orders:  No orders of the defined types were placed in this encounter.  No orders of the defined types were placed in this encounter.     Procedures: Hand/UE Inj: L thumb A1 for trigger finger on 10/22/2017 12:29 PM Medications: 1 mL lidocaine 1 %; 20 mg methylPREDNISolone acetate 40 MG/ML      Clinical Data: No additional findings.   Subjective: Chief Complaint  Patient presents with  . Left Thumb - Pain  . Hand Pain    Left thumb trigger x 2 weeks, painful, weakness, no injury, not diabetic, no surgery to thumb, HIV  Insidious onset of left thumb triggering.  No history of injury or trauma.  Has prior history of right trigger thumb with resolution after cortisone injection.  HPI  Review of Systems  Constitutional: Negative for fatigue.  HENT: Negative for trouble swallowing.   Eyes: Negative for pain.  Respiratory: Negative for shortness of breath.   Cardiovascular: Negative for leg swelling.  Gastrointestinal: Negative for constipation.  Endocrine: Negative for cold intolerance.  Genitourinary: Negative for difficulty urinating.  Musculoskeletal: Negative for joint swelling.  Skin: Negative for rash.  Allergic/Immunologic: Negative for food allergies.  Neurological: Positive for weakness.  Hematological: Does not bruise/bleed  easily.  Psychiatric/Behavioral: Negative for sleep disturbance.     Objective: Vital Signs: BP 123/79 (BP Location: Left Arm, Patient Position: Sitting, Cuff Size: Normal)   Pulse 82   Resp 16   Ht 5' 7.5" (1.715 m)   Wt 270 lb (122.5 kg)   LMP 11/06/2003   BMI 41.66 kg/m   Physical Exam  Constitutional: She is oriented to person, place, and time. She appears well-developed and well-nourished.  Eyes: Pupils are equal, round, and reactive to light. EOM are normal.  Pulmonary/Chest: Effort normal.  Neurological: She is alert and oriented to person, place, and time.  Skin: Skin is warm and dry.  Psychiatric: She has a normal mood and affect. Her behavior is normal.    Ortho Exam awake alert and oriented x3.  Comfortable sitting.  Painful nodule on the palmar aspect of the left thumb at the metacarpal phalangeal joint.  Could actively trigger of the thumb.  No swelling.  Neurovascular exam intact.  Full extension of the IP joint  Specialty Comments:  No specialty comments available.  Imaging: No results found.   PMFS History: Patient Active Problem List   Diagnosis Date Noted  . Genetic testing 06/27/2017  . Family history of non-Hodgkin's lymphoma   . Family history of lung cancer   . Endometrial thickening on ultrasound 01/25/2017  . Recurrent cancer of right breast ([place]) 11/05/2016  . Malignant neoplasm of upper-outer quadrant of right  breast in female, estrogen receptor negative ([place]) 10/09/2016  . Primary localized osteoarthritis of left knee 02/06/2016  . Primary osteoarthritis of left knee 02/05/2016  . Osteoarthritis of left knee 06/08/2015  . CKD (chronic kidney disease) stage 3, GFR 30-59 ml/min (HCC) 06/08/2015  . Vitamin D deficiency 09/23/2014  . Acute renal insufficiency 04/23/2014  . 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 Bell's palsy 12/04/2012  . Contact dermatitis 08/29/2011  . Dry eyes 08/20/2011  . Dry mouth 08/20/2011  . Neuropathy 04/09/2011    . Insomnia 04/09/2011  . Obesities, morbid ([place]) 09/27/2010  . Endometrial polyp 12/22/2009  . ALOPECIA AREATA 11/28/2009  . MENORRHAGIA, POSTMENOPAUSAL 02/03/2009  . ABNORMAL GLANDULAR PAPANICOLAOU SMEAR OF CERVIX 11/04/2008  . ALOPECIA 05/06/2008  . Trigger finger, acquired 05/06/2008  . HIP FRACTURE, RIGHT 05/06/2008  . ARTHROSCOPY, LEFT KNEE, HX OF 02/03/2008  . HYPERLIPIDEMIA, MIXED 12/15/2007  . HAND PAIN, RIGHT 12/15/2007  . Other abnormal glucose 12/15/2007  . UNSPECIFIED VITAMIN D DEFICIENCY 08/06/2007  . TINEA CAPITIS 08/04/2007  . CNTC DERMATITIS&OTH ECZEMA DUE OTH CHEM PRODUCTS 08/04/2007  . PVD 04/07/2007  . Secondary localized osteoarthrosis 04/07/2007  . Human immunodeficiency virus (HIV) disease ([place]) 03/27/2006  . Essential hypertension 03/27/2006  . BREAST CANCER, HX OF 03/27/2006   Past Medical History:  Diagnosis Date  . Alopecia areata 11/28/2009  . Bell's palsy   . Breast cancer ([place])   . BREAST CANCER, HX OF 2005  . Cancer [facility]) 2005   Breast cancer   chemotherapy and radiation  . CKD (chronic kidney disease) stage 3, GFR 30-59 ml/min (HCC) 06/08/2015  . Dry eye syndrome   . Family history of lung cancer   . Family history of non-Hodgkin's lymphoma   . Fasting hyperglycemia   . HIP FRACTURE, RIGHT 05/06/2008  . History of kidney stones   . HIV (human immunodeficiency virus infection) ([place])   . HIV DISEASE 03/27/2006  . HYPERLIPIDEMIA, MIXED 12/15/2007  . HYPERTENSION 03/27/2006  . 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 MENORRHAGIA, POSTMENOPAUSAL 02/03/2009  . Osteoarthritis of left knee 06/08/2015  . OSTEOARTHROSIS, LOCAL, SCND, UNSPC SITE 04/07/2007  . PVD 04/07/2007  . Tinea capitis   . TRIGGER FINGER 05/06/2008  . Unspecified vitamin D deficiency 08/06/2007    Family History  Problem Relation Age of Onset  . Heart attack Brother        Massive MI in 40s  . Stroke Brother        CAD  . Kidney disease Mother   . Stroke Mother   .  Diabetes Mother   . Liver disease Sister   . COPD Sister        had I-131 rx of hyperthyroidism  . Diabetes Sister   . Stroke Sister   . Lung cancer Paternal Uncle        hx smoking  . Cancer Cousin   . Non-Hodgkin's lymphoma Cousin 25       cancer x3, in prostate and lung- unsure if met/spread or if primaries  . Heart failure Father   . Heart disease Father   . Arthritis Father   . Sarcoidosis Sister     Past Surgical History:  Procedure Laterality Date  . BREAST LUMPECTOMY Right    2005  . COLONOSCOPY    . COLONOSCOPY WITH ESOPHAGOGASTRODUODENOSCOPY (EGD)    . HYSTEROSCOPY  2006  . KNEE ARTHROSCOPY Left 2002  . LYMPH NODE DISSECTION  2005  . MASTECTOMY MODIFIED RADICAL Right 11/05/2016   Procedure: RIGHT MASTECTOMY MODIFIED  RADICAL;  Surgeon: Fanny Skates, MD;  Location: [facility];  Service: General;  Laterality: Right;  . MODIFIED RADICAL MASTECTOMY Right 11/05/2016  . placement   of port-a-cath    . PORT-A-CATH REMOVAL  2006   insertion 2005  . PORT-A-CATH REMOVAL N/A 03/29/2017   Procedure: REMOVAL PORT-A-CATH;  Surgeon: Fanny Skates, MD;  Location: [facility];  Service: General;  Laterality: N/A;  . PORTACATH PLACEMENT Right 11/05/2016   Procedure: INSERTION PORT-A-CATH WITH ULTRA SOUND;  Surgeon: Fanny Skates, MD;  Location: [facility];  Service: General;  Laterality: Right;  . removal of port a cath  12/2014  . THYROID SURGERY  2009   Ablation   . TOTAL KNEE ARTHROPLASTY Left 02/06/2016   Procedure: TOTAL KNEE ARTHROPLASTY;  Surgeon: Frederik Pear, MD;  Location: [facility];  Service: Orthopedics;  Laterality: Left;  . TUBAL LIGATION     Social History   Occupational History  . Occupation: Paediatric nurse: UNEMPLOYED  Tobacco Use  . Smoking status: Never Smoker  . Smokeless tobacco: Never Used  Substance and Sexual Activity  . Alcohol use: No  . Drug use: No  . Sexual activity: Not Currently    Birth control/protection: Post-menopausal    Comment: declined  condoms

## 2017-10-24 ENCOUNTER — Ambulatory Visit (INDEPENDENT_AMBULATORY_CARE_PROVIDER_SITE_OTHER): Payer: Medicare HMO | Admitting: Orthopaedic Surgery

## 2017-10-31 MED FILL — SELZENTRY 150 MG TABS: 150 | 30 days supply | Qty: 60 | Fill #4

## 2017-11-01 DIAGNOSIS — C50911 Malignant neoplasm of unspecified site of right female breast: Secondary | ICD-10-CM | POA: Diagnosis not present

## 2017-11-06 ENCOUNTER — Other Ambulatory Visit: Payer: Self-pay | Admitting: Oncology

## 2017-11-06 MED FILL — OLMESARTAN-HCTZ 40-25 MG TA: 40-25 | 30 days supply | Qty: 30 | Fill #0

## 2017-11-08 NOTE — Progress Notes (Signed)
[facility]  Telephone:(336) [phone] Fax:([phone]     ID: OAKLEE YANDOW DOB: Jan 05, 1956  MR#: [id number]  LKG#:[id number]  Patient Care Team: Sharon Seller, NP as PCP - General (Nurse Practitioner) Daiva Eves, Lisette Grinder, MD as PCP - Infectious Diseases (Infectious Diseases) Ernesto Rutherford, MD as Consulting Physician (Ophthalmology) Romero Belling, MD as Consulting Physician (Endocrinology) Gean Birchwood, MD as Consulting Physician (Orthopedic Surgery) Katiria Calame, Valentino Hue, MD as Consulting Physician (Oncology) Claud Kelp, MD as Consulting Physician (General Surgery) Reva Bores, MD as Consulting Physician (Obstetrics and Gynecology) Axel Filler, Larna Daughters, NP as Nurse Practitioner (Hematology and Oncology) OTHER MD:  CHIEF COMPLAINT: Triple negative breast cancer, recurrent  CURRENT TREATMENT: Observation  INTERVAL HISTORY: Kayla Price returns today for follow-up and treatment of her estrogen receptor negative breast cancer. She completed 6 cycles of gemcitabine and carboplatin as of 03/11/2017. She continues under observation.  Since her last visit, she completed a CT chest abdomen and pelvis on 05/17/2017 showing: No definite findings to suggest metastatic disease in the chest, abdomen or pelvis. Interval removal above right-sided Port-A-Cath with mild scarring at the previous implantation site (which should not be confused for recurrent disease). Cholelithiasis without evidence of acute cholecystitis at this time. Aortic Atherosclerosis (ICD10-I70.0).   REVIEW OF SYSTEMS:  Benni reports that [facility]. She walks at least a mile on the treadmill and bikes on a stationary bike for 5 miles. She also does back extensions. She doesn't do much weight lifting because her right arm is hypersensitive and sometimes feels heavier. She stopped wearing her compression sleeve  because it felt hot. She has been trying to lose weight, but her weight fluctuates. She recently felt a fluttering and crawling sensation in her left ear. She thought this may have been due to a prior incident with Bell's Palsy that affected her right ear. She got cortisone shots in her left thumb due trigger finger. She denies unusual headaches, visual changes, nausea, vomiting, or dizziness. There has been no unusual cough, phlegm production, or pleurisy. This been no change in bowel or bladder habits. She denies unexplained fatigue or unexplained weight loss, bleeding, rash, or fever. A detailed review of systems was otherwise stable.    BREAST CANCER HISTORY: From the original intake note:  Anevaeh is a history of right-sided breast cancer dating back to 2005. She had a lumpectomy with sentinel lymph node sampling, chemotherapy, and radiation. I do not have access to those records at present.  More recently she [facility] showing a possible mass in the right breast. Diagnostic mammography with ultrasonography on 09/28/2016 the patient underwent right diagnostic mammography with tomography and right breast ultrasonography. The breast density was category A. In the right breast at the 10:00 position there was an irregular mass measuring 2.5 cm. Ultrasound identified this the 10:00 radiant 10 cm from the nipple measuring 2.4 cm. In the right axilla there was an abnormal lymph node measuring 1.3 cm with other normal-appearing lymph nodes.  On 10/01/2016 she  underwent biopsy of the right breast mass in question as well as the suspicious axillary lymph node. 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 The proliferation marker was 70% in the breast lesion and 50% in the lymph  node.  Her subsequent  history is as detailed below.   PAST MEDICAL HISTORY: Past Medical History:  Diagnosis Date  . Alopecia areata 11/28/2009  . Bell's palsy   . Breast cancer (HCC)   . BREAST CANCER, HX OF 2005  . Cancer [facility]) 2005   Breast cancer   chemotherapy and radiation  . CKD (chronic kidney disease) stage 3, GFR 30-59 ml/min (HCC) 06/08/2015  . Dry eye syndrome   . Family history of lung cancer   . Family history of non-Hodgkin's lymphoma   . Fasting hyperglycemia   . HIP FRACTURE, RIGHT 05/06/2008  . History of kidney stones   . HIV (human immunodeficiency virus infection) (HCC)   . HIV DISEASE 03/27/2006  . HYPERLIPIDEMIA, MIXED 12/15/2007  . HYPERTENSION 03/27/2006  . 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 MENORRHAGIA, POSTMENOPAUSAL 02/03/2009  . Osteoarthritis of left knee 06/08/2015  . OSTEOARTHROSIS, LOCAL, SCND, UNSPC SITE 04/07/2007  . PVD 04/07/2007  . Tinea capitis   . TRIGGER FINGER 05/06/2008  . Unspecified vitamin D deficiency 08/06/2007    PAST SURGICAL HISTORY: Past Surgical History:  Procedure Laterality Date  . BREAST LUMPECTOMY Right    2005  . COLONOSCOPY    . COLONOSCOPY WITH ESOPHAGOGASTRODUODENOSCOPY (EGD)    . HYSTEROSCOPY  2006  . KNEE ARTHROSCOPY Left 2002  . LYMPH NODE DISSECTION  2005  . MASTECTOMY MODIFIED RADICAL Right 11/05/2016   Procedure: RIGHT MASTECTOMY MODIFIED RADICAL;  Surgeon: Claud Kelp, MD;  Location: [facility] OR;  Service: General;  Laterality: Right;  . MODIFIED RADICAL MASTECTOMY Right 11/05/2016  . placement   of port-a-cath    . PORT-A-CATH REMOVAL  2006   insertion 2005  . PORT-A-CATH REMOVAL N/A 03/29/2017   Procedure: REMOVAL PORT-A-CATH;  Surgeon: Claud Kelp, MD;  Location: [facility] OR;  Service: General;  Laterality: N/A;  . PORTACATH PLACEMENT Right 11/05/2016   Procedure: INSERTION PORT-A-CATH WITH ULTRA SOUND;  Surgeon: Claud Kelp, MD;  Location: [facility] OR;  Service: General;  Laterality: Right;  . removal of port a cath   12/2014  . THYROID SURGERY  2009   Ablation   . TOTAL KNEE ARTHROPLASTY Left 02/06/2016   Procedure: TOTAL KNEE ARTHROPLASTY;  Surgeon: Gean Birchwood, MD;  Location: [facility] OR;  Service: Orthopedics;  Laterality: Left;  . TUBAL LIGATION      FAMILY HISTORY Family History  Problem Relation Age of Onset  . Heart attack Brother        Massive MI in 22s  . Stroke Brother        CAD  . Kidney disease Mother   . Stroke Mother   . Diabetes Mother   . Liver disease Sister   . COPD Sister        had I-131 rx of hyperthyroidism  . Diabetes Sister   . Stroke Sister   . Lung cancer Paternal Uncle        hx smoking  . Cancer Cousin   . Non-Hodgkin's lymphoma Cousin 25       cancer x3, in prostate and lung- unsure if met/spread or if primaries  . Heart failure Father   . Heart disease Father   . Arthritis Father   . Sarcoidosis Sister   The patient's father died at the age of 66 in the setting of Alzheimer's disease. The patient's mother died at the age of 66 from complications of diabetes. The patient has 3 brothers, 4 sisters. There is no history of breast or ovarian cancer in the family  area   GYNECOLOGIC HISTORY:  Patient's last menstrual period was 11/06/2003.  menarche age 30, first live birth age 38, the patient is GX P2. She stopped having periods in 2005, with her chemotherapy; she never took hormone replacement   SOCIAL HISTORY:  Talin worked for the [employer] more than 20 years. 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 At home she lives with her son [name], her daughter [name] who works for [employer] and her granddaughter [name], 42 y/o AS OF APRIL 2018     ADVANCED DIRECTIVES:  not in place    HEALTH MAINTENANCE: Social History   Tobacco Use  . Smoking status: Never Smoker  . Smokeless tobacco: Never Used  Substance Use Topics  . Alcohol use: No  . Drug use: No     Colonoscopy:  PAP:  Bone  density:   Allergies  Allergen Reactions  . Lisinopril Anaphylaxis and Swelling    Swelling of tongue and mouth 11/05/16- tolerates Olmesartan  . Pepcid [Famotidine] Other (See Comments)    PPI H2, BLOCKERS LOWER GASTRIC PH WHICH WOULD LEAD TO SUBTHERAPEUTIC RILPIVIRINE LEVELS AND POTENTIAL VIROLOGICAL FAILURE WITH RESISTANCE  . Prilosec [Omeprazole] Other (See Comments)    PPI H2, BLOCKERS LOWER GASTRIC PH WHICH WOULD LEAD TO SUBTHERAPEUTIC RILPIVIRINE LEVELS AND POTENTIAL VIROLOGICAL FAILURE WITH RESISTANCE  . Tums [Calcium Carbonate Antacid] Other (See Comments)    TUMS ANTACIDS CAN LOWER GASTRIC PH WHICH COULD  LEAD TO SUBTHERAPEUTIC RILPIVIRINE LEVELS AND POTENTIAL VIROLOGICAL FAILURE WITH RESISTANCE TUMS CAN BE GIVEN BUT NEED CONSULT WITH ID PHARMACY RE TIMING. I PREFER HER TO AVOID ALL TOGETHER    Current Outpatient Medications  Medication Sig Dispense Refill  . CALCIUM-VITAMIN D PO Take 1 tablet by mouth as needed.     . Cholecalciferol (VITAMIN D3) 2000 units TABS Take 2,000 Units by mouth daily with lunch.    . Dolutegravir-Rilpivirine (JULUCA) 50-25 MG TABS Take 1 tablet by mouth daily with breakfast. Take with the Prezcobix. (Patient not taking: Reported on 10/22/2017) 30 tablet 9  . HYDROcodone-acetaminophen (NORCO/VICODIN) 5-325 MG tablet Take 1 tablet by mouth every 6 (six) hours as needed for moderate pain. 60 tablet 0  . JULUCA 50-25 MG TABS TAKE 1 TABLET BY MOUTH DAILY WITH BREAKFAST. TAKE WITH THE PREZCOBIX. 30 tablet 2  . levothyroxine (SYNTHROID, LEVOTHROID) 175 MCG tablet Take 1 tablet (175 mcg total) by mouth daily before breakfast. 90 tablet 3  . loratadine (CLARITIN) 10 MG tablet Take 10 mg by mouth as needed for itching. Reported on 06/08/2015    . maraviroc (SELZENTRY) 150 MG tablet Take 1 tablet (150 mg total) by mouth 2 (two) times daily. 60 tablet 11  . methocarbamol (ROBAXIN) 500 MG tablet Take 500 mg by mouth every 8 (eight) hours as needed for muscle spasms.     . Multiple Vitamins-Minerals (HAIR/SKIN/NAILS) TABS Take 1 tablet by mouth daily.    Marland Kitchen olmesartan-hydrochlorothiazide (BENICAR HCT) 40-25 MG tablet TAKE 1 TABLET BY MOUTH DAILY. 30 tablet 1  . Omega-3 Fatty Acids (FISH OIL PO) Take 1 capsule by mouth daily with lunch.    . Pitavastatin Calcium 4 MG TABS Take 1 tablet (4 mg total) by mouth daily. This may be placebo, study provided. Do not dispense. 30 tablet 11  . PREZCOBIX 800-150 MG tablet TAKE 1 TABLET BY MOUTH DAILY. SWALLOW WHOLE. DO NOT CRUSH, BREAK OR CHEW TABLETS. TAKE WITH FOOD. 30 tablet 9  . Probiotic Product (  PROBIOTIC PO) Take 1 capsule by mouth daily with lunch.     No current facility-administered medications for this visit.     OBJECTIVE: Middle-aged African-American woman in no acute distress  Vitals:   11/11/17 0945  BP: 120/75  Pulse: 84  Resp: 18  Temp: 98.4 F (36.9 C)  SpO2: 99%     Body mass index is 40.86 kg/m.    Filed Weights   11/11/17 0945  Weight: 273 lb (123.8 kg)     ECOG FS:0 - Asymptomatic  Sclerae unicteric, EOMs intact Oropharynx clear and moist No cervical or supraclavicular adenopathy Lungs no rales or rhonchi Heart regular rate and rhythm Abd soft, nontender, positive bowel sounds MSK no focal spinal tenderness, no upper extremity lymphedema Neuro: nonfocal, well oriented, appropriate affect Breasts: The right breast is status post mastectomy.  There is no evidence of chest wall recurrence.  The left breast is benign.  Both axillae are benign.    LAB RESULTS:  CMP     Component Value Date/Time   NA 144 10/15/2017 1007   NA 140 05/13/2017 0927   K 4.3 10/15/2017 1007   K 4.1 05/13/2017 0927   CL 104 10/15/2017 1007   CO2 34 (H) 10/15/2017 1007   CO2 29 05/13/2017 0927   GLUCOSE 95 10/15/2017 1007   GLUCOSE 102 05/13/2017 0927   BUN 21 10/15/2017 1007   BUN 17.7 05/13/2017 0927   CREATININE 1.47 (H) 10/15/2017 1007   CREATININE 1.4 (H) 05/13/2017 0927   CALCIUM 10.3  10/15/2017 1007   CALCIUM 10.5 (H) 05/13/2017 0927   PROT 7.2 10/15/2017 1007   PROT 8.1 05/13/2017 0927   ALBUMIN 3.5 05/13/2017 0927   AST 13 10/15/2017 1007   AST 14 05/13/2017 0927   ALT 10 10/15/2017 1007   ALT 12 05/13/2017 0927   ALKPHOS 90 05/13/2017 0927   BILITOT 0.4 10/15/2017 1007   BILITOT 0.27 05/13/2017 0927   GFRNONAA 38 (L) 10/15/2017 1007   GFRAA 44 (L) 10/15/2017 1007    No results found for: TOTALPROTELP, ALBUMINELP, A1GS, A2GS, BETS, BETA2SER, GAMS, MSPIKE, SPEI  No results found for: Ron Parker, [facility]  Lab Results  Component Value Date   WBC 4.6 10/15/2017   NEUTROABS 1,872 10/15/2017   HGB 14.0 10/15/2017   HCT 40.4 10/15/2017   MCV 95.3 10/15/2017   PLT 172 10/15/2017      Chemistry      Component Value Date/Time   NA 144 10/15/2017 1007   NA 140 05/13/2017 0927   K 4.3 10/15/2017 1007   K 4.1 05/13/2017 0927   CL 104 10/15/2017 1007   CO2 34 (H) 10/15/2017 1007   CO2 29 05/13/2017 0927   BUN 21 10/15/2017 1007   BUN 17.7 05/13/2017 0927   CREATININE 1.47 (H) 10/15/2017 1007   CREATININE 1.4 (H) 05/13/2017 0927   GLU 104 02/13/2016 1358      Component Value Date/Time   CALCIUM 10.3 10/15/2017 1007   CALCIUM 10.5 (H) 05/13/2017 0927   ALKPHOS 90 05/13/2017 0927   AST 13 10/15/2017 1007   AST 14 05/13/2017 0927   ALT 10 10/15/2017 1007   ALT 12 05/13/2017 0927   BILITOT 0.4 10/15/2017 1007   BILITOT 0.27 05/13/2017 0927       Lab Results  Component Value Date   LABCA2 11 04/20/2008    No components found for: [id number]  No results for input(s): INR in the last 168 hours.  Urinalysis  Component Value Date/Time   COLORURINE YELLOW 01/27/2016 1028   APPEARANCEUR CLEAR 01/27/2016 1028   LABSPEC 1.028 01/27/2016 1028   PHURINE 5.5 01/27/2016 1028   GLUCOSEU NEGATIVE 01/27/2016 1028   GLUCOSEU NEG mg/dL 16/03/9603 5409   HGBUR NEGATIVE 01/27/2016 1028   BILIRUBINUR NEGATIVE 01/27/2016 1028    KETONESUR NEGATIVE 01/27/2016 1028   PROTEINUR NEGATIVE 01/27/2016 1028   UROBILINOGEN 1 07/23/2006 2113   NITRITE NEGATIVE 01/27/2016 1028   LEUKOCYTESUR NEGATIVE 01/27/2016 1028     STUDIES: Since her last visit, she completed a CT chest abdomen and pelvis on 05/17/2017 showing: No definite findings to suggest metastatic disease in the chest, abdomen or pelvis. Interval removal above right-sided Port-A-Cath with mild scarring at the previous implantation site (which should not be confused for recurrent disease). Cholelithiasis without evidence of acute cholecystitis at this time. Aortic Atherosclerosis (ICD10-I70.0).  ELIGIBLE FOR AVAILABLE RESEARCH PROTOCOL: no  ASSESSMENT: 62 y.o. [place] woman   (1) status post right lumpectomy and sentinel lymph node sampling October 2005 for a 0.6 cm invasive ductal carcinoma involving one out of 2 sentinel lymph nodes sampled, grade 3, triple-negative, treated adjuvantly with doxorubicin and cyclophosphamide 4 followed by weekly paclitaxel 7, followed by adjuvant radiation  RECURRENT DISEASE: (2) status post right breast upper outer quadrant biopsy and right axillary lymph node biopsy 10/01/2016, both positive for a T2 N1, stage IIIB invasive ductal carcinoma, triple negative, with an MIB-1 of 50-70%  (3) status post right modified radical mastectomy 11/05/2016 showing a pT2 pN1, stage IIIB invasive ductal carcinoma, grade 3, triple negative, with negative margins  (4) Not a candidate for radiation given prior history  (5) adjuvant chemotherapy consisting of carboplatin and gemcitabine given days 1 and 8 of each 21 day cycle, for 6 cycles, starting 11/20/2016, completed 03/11/2017  (a) day 8 cycle 2 omitted because of neutropenia; Neupogen/Neulasta added  (5) HIV positivity  PLAN:   Tenzing is now a little over a year out from definitive surgery for her current breast cancer with no evidence of disease recurrence.  This is very  favorable.  Her lab work is being checked by Dr. Algis Liming every 3 months so we are not going to be drawing labs here to avoid duplication.  She is behind on her mammography.  I have gone and put in the order for her to have a left mammogram sometime this month at the breast center.  Otherwise I am delighted at her exercise program.  If she wishes to control her weight she will have to considerably cut back on carbohydrates.  She will see my physician's assistant in 6 months.  She will see me again in June 2020  She knows to call for any other issues that may develop before the next visit.   Vali Capano, Valentino Hue, MD  11/11/17 10:05 AM Medical Oncology and Hematology [facility] [address] [place], [place] [address] Tel. [phone]    Fax. [phone]  This document serves as a record of services personally performed by Ruthann Cancer, MD. It was created on his behalf by Merideth Abbey, a trained medical scribe. The creation of this record is based on the scribe's personal observations and the provider's statements to them.   I have reviewed the above documentation for accuracy and completeness, and I agree with the above.

## 2017-11-11 ENCOUNTER — Telehealth: Payer: Self-pay | Admitting: Oncology

## 2017-11-11 ENCOUNTER — Inpatient Hospital Stay: Payer: Medicare HMO

## 2017-11-11 ENCOUNTER — Inpatient Hospital Stay: Payer: Medicare HMO | Attending: Oncology | Admitting: Oncology

## 2017-11-11 DIAGNOSIS — Z9221 Personal history of antineoplastic chemotherapy: Secondary | ICD-10-CM | POA: Insufficient documentation

## 2017-11-11 DIAGNOSIS — B20 Human immunodeficiency virus [HIV] disease: Secondary | ICD-10-CM | POA: Diagnosis not present

## 2017-11-11 DIAGNOSIS — I700 Atherosclerosis of aorta: Secondary | ICD-10-CM

## 2017-11-11 DIAGNOSIS — Z9011 Acquired absence of right breast and nipple: Secondary | ICD-10-CM | POA: Insufficient documentation

## 2017-11-11 DIAGNOSIS — Z853 Personal history of malignant neoplasm of breast: Secondary | ICD-10-CM | POA: Insufficient documentation

## 2017-11-11 DIAGNOSIS — I10 Essential (primary) hypertension: Secondary | ICD-10-CM | POA: Diagnosis not present

## 2017-11-11 DIAGNOSIS — Z6841 Body Mass Index (BMI) 40.0 and over, adult: Secondary | ICD-10-CM

## 2017-11-11 NOTE — Telephone Encounter (Signed)
Gave patient AVS and calendar of upcoming November and June 2020 appointments.

## 2017-11-12 MED FILL — JULUCA 50-25 MG TAB: 50-25 | 30 days supply | Qty: 30 | Fill #1

## 2017-11-12 MED FILL — PREZCOBIX 800 MG-150 MG TAB: 800-150 | 30 days supply | Qty: 30 | Fill #5

## 2017-12-02 ENCOUNTER — Encounter (INDEPENDENT_AMBULATORY_CARE_PROVIDER_SITE_OTHER): Payer: Self-pay | Admitting: *Deleted

## 2017-12-02 VITALS — BP 106/71 | HR 80 | Temp 97.8°F | Wt 276.2 lb

## 2017-12-02 DIAGNOSIS — Z006 Encounter for examination for normal comparison and control in clinical research program: Secondary | ICD-10-CM

## 2017-12-02 NOTE — Progress Notes (Signed)
Kayla Price was here for month 24 of the Reprieve Study. She denies any new problems. She has completed her treatment for breast cancer. She will be coming back n October for the next study visit.

## 2017-12-10 MED FILL — JULUCA 50-25 MG TAB: 50-25 | 30 days supply | Qty: 30 | Fill #2

## 2017-12-10 MED FILL — OLMESARTAN-HCTZ 40-25 MG TA: 40-25 | 30 days supply | Qty: 30 | Fill #1

## 2017-12-10 MED FILL — PREZCOBIX 800 MG-150 MG TAB: 800-150 | 30 days supply | Qty: 30 | Fill #6

## 2017-12-11 ENCOUNTER — Ambulatory Visit
Admission: RE | Admit: 2017-12-11 | Discharge: 2017-12-11 | Disposition: A | Discharge status: Home / Self Care | Payer: Medicare HMO | Source: Ambulatory Visit | Attending: Oncology | Admitting: Oncology

## 2017-12-11 DIAGNOSIS — I700 Atherosclerosis of aorta: Secondary | ICD-10-CM

## 2017-12-11 DIAGNOSIS — Z1231 Encounter for screening mammogram for malignant neoplasm of breast: Secondary | ICD-10-CM | POA: Diagnosis not present

## 2017-12-11 MED FILL — SELZENTRY 150 MG TABS: 150 | 30 days supply | Qty: 60 | Fill #5

## 2017-12-19 ENCOUNTER — Other Ambulatory Visit: Payer: Self-pay | Admitting: Nurse Practitioner

## 2017-12-19 DIAGNOSIS — M1990 Unspecified osteoarthritis, unspecified site: Secondary | ICD-10-CM

## 2017-12-19 NOTE — Telephone Encounter (Signed)
A medication refill was received from pharmacy for hydrocodone/apap 5-325 mg. Rx was pended to provider for approval  after verifying last fill date, provider, and quantity on PMP [place]

## 2017-12-20 MED FILL — HYDROCODON-APAP 5-325: 5-325 | 15 days supply | Qty: 60 | Fill #0

## 2017-12-25 ENCOUNTER — Other Ambulatory Visit: Payer: Medicare HMO

## 2017-12-25 DIAGNOSIS — Z79899 Other long term (current) drug therapy: Secondary | ICD-10-CM

## 2017-12-25 DIAGNOSIS — I10 Essential (primary) hypertension: Secondary | ICD-10-CM | POA: Diagnosis not present

## 2017-12-25 DIAGNOSIS — Z113 Encounter for screening for infections with a predominantly sexual mode of transmission: Secondary | ICD-10-CM

## 2017-12-25 DIAGNOSIS — E782 Mixed hyperlipidemia: Secondary | ICD-10-CM

## 2017-12-25 DIAGNOSIS — Z171 Estrogen receptor negative status [ER-]: Secondary | ICD-10-CM

## 2017-12-25 DIAGNOSIS — C50411 Malignant neoplasm of upper-outer quadrant of right female breast: Secondary | ICD-10-CM | POA: Diagnosis not present

## 2017-12-25 DIAGNOSIS — B20 Human immunodeficiency virus [HIV] disease: Secondary | ICD-10-CM

## 2017-12-27 LAB — CBC WITH DIFFERENTIAL/PLATELET
BASOS PCT: 0.7 %
Basophils Absolute: 29 cells/uL (ref 0–200)
EOS ABS: 71 {cells}/uL (ref 15–500)
Eosinophils Relative: 1.7 %
HCT: 43.6 % (ref 35.0–45.0)
HEMOGLOBIN: 15 g/dL (ref 11.7–15.5)
Lymphs Abs: 1991 cells/uL (ref 850–3900)
MCH: 32.8 pg (ref 27.0–33.0)
MCHC: 34.4 g/dL (ref 32.0–36.0)
MCV: 95.2 fL (ref 80.0–100.0)
MPV: 10 fL (ref 7.5–12.5)
Monocytes Relative: 11.3 %
NEUTROS ABS: 1634 {cells}/uL (ref 1500–7800)
Neutrophils Relative %: 38.9 %
Platelets: 177 10*3/uL (ref 140–400)
RBC: 4.58 10*6/uL (ref 3.80–5.10)
RDW: 13 % (ref 11.0–15.0)
Total Lymphocyte: 47.4 %
WBC: 4.2 10*3/uL (ref 3.8–10.8)
WBCMIX: 475 {cells}/uL (ref 200–950)

## 2017-12-27 LAB — COMPLETE METABOLIC PANEL WITH GFR
AG RATIO: 1.1 (calc) (ref 1.0–2.5)
ALKALINE PHOSPHATASE (APISO): 72 U/L (ref 33–130)
ALT: 11 U/L (ref 6–29)
AST: 14 U/L (ref 10–35)
Albumin: 4 g/dL (ref 3.6–5.1)
BILIRUBIN TOTAL: 0.6 mg/dL (ref 0.2–1.2)
BUN / CREAT RATIO: 12 (calc) (ref 6–22)
BUN: 17 mg/dL (ref 7–25)
CO2: 30 mmol/L (ref 20–32)
Calcium: 10.8 mg/dL — ABNORMAL HIGH (ref 8.6–10.4)
Chloride: 105 mmol/L (ref 98–110)
Creat: 1.4 mg/dL — ABNORMAL HIGH (ref 0.50–0.99)
GFR, Est African American: 47 mL/min/{1.73_m2} — ABNORMAL LOW (ref >=60)
GFR, Est Non African American: 40 mL/min/{1.73_m2} — ABNORMAL LOW (ref >=60)
GLOBULIN: 3.6 g/dL (ref 1.9–3.7)
Glucose, Bld: 100 mg/dL — ABNORMAL HIGH (ref 65–99)
POTASSIUM: 4.4 mmol/L (ref 3.5–5.3)
SODIUM: 145 mmol/L (ref 135–146)
TOTAL PROTEIN: 7.6 g/dL (ref 6.1–8.1)

## 2017-12-27 LAB — T-HELPER CELL (CD4) - (RCID CLINIC ONLY)
CD4 T CELL ABS: 690 /uL (ref 400–2700)
CD4 T CELL HELPER: 35 % (ref 33–55)

## 2017-12-27 LAB — LIPID PANEL
Cholesterol: 199 mg/dL (ref <200)
HDL: 60 mg/dL (ref >50)
LDL CHOLESTEROL (CALC): 116 mg/dL — AB
Non-HDL Cholesterol (Calc): 139 mg/dL (calc) — ABNORMAL HIGH (ref <130)
TRIGLYCERIDES: 115 mg/dL (ref <150)
Total CHOL/HDL Ratio: 3.3 (calc) (ref <5.0)

## 2017-12-27 LAB — RPR: RPR Ser Ql: NONREACTIVE

## 2017-12-30 LAB — HIV-1 RNA QUANT-NO REFLEX-BLD
HIV 1 RNA Quant: <20 copies/mL
HIV-1 RNA Quant, Log: <1.3 Log copies/mL

## 2017-12-31 ENCOUNTER — Other Ambulatory Visit: Payer: Self-pay | Admitting: Infectious Disease

## 2017-12-31 ENCOUNTER — Other Ambulatory Visit: Payer: Self-pay | Admitting: Oncology

## 2017-12-31 DIAGNOSIS — B20 Human immunodeficiency virus [HIV] disease: Secondary | ICD-10-CM

## 2018-01-06 MED FILL — OLMESARTAN-HCTZ 40-25 MG TA: 40-25 | 30 days supply | Qty: 30 | Fill #0

## 2018-01-06 MED FILL — JULUCA 50-25 MG TAB: 50-25 | 30 days supply | Qty: 30 | Fill #0

## 2018-01-06 MED FILL — PREZCOBIX 800 MG-150 MG TAB: 800-150 | 30 days supply | Qty: 30 | Fill #7

## 2018-01-06 MED FILL — SELZENTRY 150 MG TABS: 150 | 30 days supply | Qty: 60 | Fill #6

## 2018-01-09 ENCOUNTER — Ambulatory Visit: Payer: Medicare HMO | Admitting: Infectious Disease

## 2018-01-16 ENCOUNTER — Ambulatory Visit: Payer: Medicare HMO | Admitting: Infectious Disease

## 2018-01-16 ENCOUNTER — Ambulatory Visit (INDEPENDENT_AMBULATORY_CARE_PROVIDER_SITE_OTHER): Payer: Medicare HMO | Admitting: Family

## 2018-01-16 ENCOUNTER — Encounter: Payer: Self-pay | Admitting: Family

## 2018-01-16 VITALS — BP 137/90 | HR 62 | Temp 98.0°F | Ht 68.0 in | Wt 278.0 lb

## 2018-01-16 DIAGNOSIS — Z23 Encounter for immunization: Secondary | ICD-10-CM | POA: Diagnosis not present

## 2018-01-16 DIAGNOSIS — E782 Mixed hyperlipidemia: Secondary | ICD-10-CM

## 2018-01-16 DIAGNOSIS — J301 Allergic rhinitis due to pollen: Secondary | ICD-10-CM

## 2018-01-16 DIAGNOSIS — B20 Human immunodeficiency virus [HIV] disease: Secondary | ICD-10-CM | POA: Diagnosis not present

## 2018-01-16 MED ORDER — PREDNISONE 5 MG (21) PO TBPK: ORAL_TABLET | ORAL | 0 refills | Status: DC

## 2018-01-16 MED ORDER — DARUNAVIR-COBICISTAT 800-150 MG PO TABS
ORAL_TABLET | ORAL | 5 refills | Status: DC
Start: 2018-01-16 — End: 2018-05-15

## 2018-01-16 MED ORDER — DOLUTEGRAVIR-RILPIVIRINE 50-25 MG PO TABS: 1.0000 | ORAL_TABLET | Freq: Every day | ORAL | 5 refills | Status: DC

## 2018-01-16 NOTE — Addendum Note (Signed)
Addended by: Eugenia Mcalpine on: 01/16/2018 12:19 PM   Modules accepted: Orders

## 2018-01-16 NOTE — Addendum Note (Signed)
Addended by: Mauricio Po D on: 01/16/2018 04:31 PM   Modules accepted: Orders

## 2018-01-16 NOTE — Progress Notes (Signed)
Subjective:    Patient ID: Kayla Price, female    DOB: May 27, 1956, 62 y.o.   MRN: [id number]  Chief Complaint  Patient presents with  . HIV Positive/AIDS     HPI:  Kayla Price is a 62 y.o. female who presents today for a routine follow up office visit for her HIV disease.  Kayla Price was last seen in the office on 07/01/17 by Dr. Tommy Medal and is currently an active study participant. Her medication regimen of Juluca and Prezcobix was continued with discussion of adding Maraviroc for the potential of preventing breast cancer recurrence. Her most recent blood work has been reviewed in detail with a CD4 count of 690 and an undetectable viral load. Kidney, liver and electrolyte function appear stable and consistent with previous values. Lipid profile shows slightly elevated LDL cholesterol at 116. RPR was non-reactive. She is due for Prevnar today.   Kayla Price has been taking her medications as prescribed with no adverse side effects. She has not missed any dosages and has no problems obtaining or taking her medications.Denies fevers, chills, night sweats, headaches, changes in vision, neck pain/stiffness, nausea, diarrhea, vomiting, lesions or rashes.  She does have a new problem today with the associated symptoms of sinus congestion, scratchy throat, itchy/watery eyes, and sneezing that have been going on for about 4 days. No fevers or chills. Modifying factors include Guaifenesin which has helped a little. She continues to take her Claritin. Symptoms are worse when she goes outside. Symptoms have improved since initial onset.   Immunization History  Administered Date(s) Administered  . Hepatitis B 07/11/1992, 11/17/2001, 03/02/2002, 04/09/2011  . Influenza Split 04/10/2011, 03/05/2012  . Influenza Whole 03/27/2006, 04/07/2007, 05/06/2008, 05/16/2009, 09/27/2010  . Influenza,inj,Quad PF,6+ Mos 03/09/2013, 04/23/2014, 03/22/2016  . Influenza-Unspecified 06/08/2015, 03/06/2017    . Pneumococcal Polysaccharide-23 03/27/2006, 02/27/2012    Allergies  Allergen Reactions  . Lisinopril Anaphylaxis and Swelling    Swelling of tongue and mouth 11/05/16- tolerates Olmesartan  . Pepcid [Famotidine] Other (See Comments)    PPI H2, BLOCKERS LOWER GASTRIC PH WHICH WOULD LEAD TO SUBTHERAPEUTIC RILPIVIRINE LEVELS AND POTENTIAL VIROLOGICAL FAILURE WITH RESISTANCE  . Prilosec [Omeprazole] Other (See Comments)    PPI H2, BLOCKERS LOWER GASTRIC PH WHICH WOULD LEAD TO SUBTHERAPEUTIC RILPIVIRINE LEVELS AND POTENTIAL VIROLOGICAL FAILURE WITH RESISTANCE  . Tums [Calcium Carbonate Antacid] Other (See Comments)    TUMS ANTACIDS CAN LOWER GASTRIC PH WHICH COULD  LEAD TO SUBTHERAPEUTIC RILPIVIRINE LEVELS AND POTENTIAL VIROLOGICAL FAILURE WITH RESISTANCE TUMS CAN BE GIVEN BUT NEED CONSULT WITH ID PHARMACY RE TIMING. I PREFER HER TO AVOID ALL TOGETHER      Outpatient Medications Prior to Visit  Medication Sig Dispense Refill  . CALCIUM-VITAMIN D PO Take 1 tablet by mouth as needed.     . Cholecalciferol (VITAMIN D3) 2000 units TABS Take 2,000 Units by mouth daily with lunch.    Marland Kitchen HYDROcodone-acetaminophen (NORCO/VICODIN) 5-325 MG tablet TAKE 1 TABLET BY MOUTH EVERY 6 HOURS AS NEEDED FOR MODERATE PAIN 60 tablet 0  . levothyroxine (SYNTHROID, LEVOTHROID) 175 MCG tablet Take 1 tablet (175 mcg total) by mouth daily before breakfast. 90 tablet 3  . loratadine (CLARITIN) 10 MG tablet Take 10 mg by mouth as needed for itching. Reported on 06/08/2015    . maraviroc (SELZENTRY) 150 MG tablet Take 1 tablet (150 mg total) by mouth 2 (two) times daily. 60 tablet 11  . Multiple Vitamins-Minerals (HAIR/SKIN/NAILS) TABS Take 1 tablet by mouth daily.    Marland Kitchen  olmesartan-hydrochlorothiazide (BENICAR HCT) 40-25 MG tablet TAKE 1 TABLET BY MOUTH DAILY. 30 tablet 1  . Omega-3 Fatty Acids (FISH OIL PO) Take 1 capsule by mouth daily with lunch.    . Pitavastatin Calcium 4 MG TABS Take 1 tablet (4 mg total) by mouth  daily. This may be placebo, study provided. Do not dispense. 30 tablet 11  . Probiotic Product (PROBIOTIC PO) Take 1 capsule by mouth daily with lunch.    . Dolutegravir-Rilpivirine (JULUCA) 50-25 MG TABS Take 1 tablet by mouth daily with breakfast. Take with the Prezcobix. 30 tablet 9  . JULUCA 50-25 MG TABS TAKE 1 TABLET BY MOUTH DAILY WITH BREAKFAST. TAKE WITH THE PREZCOBIX. 30 tablet 2  . PREZCOBIX 800-150 MG tablet TAKE 1 TABLET BY MOUTH DAILY. SWALLOW WHOLE. DO NOT CRUSH, BREAK OR CHEW TABLETS. TAKE WITH FOOD. 30 tablet 9   No facility-administered medications prior to visit.      Past Medical History:  Diagnosis Date  . Alopecia areata 11/28/2009  . Bell's palsy   . Breast cancer ([place])   . BREAST CANCER, HX OF 2005  . Cancer [facility]) 2005   Breast cancer   chemotherapy and radiation  . CKD (chronic kidney disease) stage 3, GFR 30-59 ml/min (HCC) 06/08/2015  . Dry eye syndrome   . Family history of lung cancer   . Family history of non-Hodgkin's lymphoma   . Fasting hyperglycemia   . HIP FRACTURE, RIGHT 05/06/2008  . History of kidney stones   . HIV (human immunodeficiency virus infection) ([place])   . HIV DISEASE 03/27/2006  . HYPERLIPIDEMIA, MIXED 12/15/2007  . HYPERTENSION 03/27/2006  . 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 MENORRHAGIA, POSTMENOPAUSAL 02/03/2009  . Osteoarthritis of left knee 06/08/2015  . OSTEOARTHROSIS, LOCAL, SCND, UNSPC SITE 04/07/2007  . PVD 04/07/2007  . Tinea capitis   . TRIGGER FINGER 05/06/2008  . Unspecified vitamin D deficiency 08/06/2007     Past Surgical History:  Procedure Laterality Date  . BREAST LUMPECTOMY Right    2005  . COLONOSCOPY    . COLONOSCOPY WITH ESOPHAGOGASTRODUODENOSCOPY (EGD)    . HYSTEROSCOPY  2006  . KNEE ARTHROSCOPY Left 2002  . LYMPH NODE DISSECTION  2005  . MASTECTOMY MODIFIED RADICAL Right 11/05/2016   Procedure: RIGHT MASTECTOMY MODIFIED RADICAL;  Surgeon: Fanny Skates, MD;  Location: [place];  Service: General;   Laterality: Right;  . MODIFIED RADICAL MASTECTOMY Right 11/05/2016  . placement   of port-a-cath    . PORT-A-CATH REMOVAL  2006   insertion 2005  . PORT-A-CATH REMOVAL N/A 03/29/2017   Procedure: REMOVAL PORT-A-CATH;  Surgeon: Fanny Skates, MD;  Location: [place];  Service: General;  Laterality: N/A;  . PORTACATH PLACEMENT Right 11/05/2016   Procedure: INSERTION PORT-A-CATH WITH ULTRA SOUND;  Surgeon: Fanny Skates, MD;  Location: [place];  Service: General;  Laterality: Right;  . removal of port a cath  12/2014  . THYROID SURGERY  2009   Ablation   . TOTAL KNEE ARTHROPLASTY Left 02/06/2016   Procedure: TOTAL KNEE ARTHROPLASTY;  Surgeon: Frederik Pear, MD;  Location: [place];  Service: Orthopedics;  Laterality: Left;  . TUBAL LIGATION         Review of Systems  Constitutional: Negative for appetite change, chills, diaphoresis, fatigue, fever and unexpected weight change.  HENT: Positive for congestion, rhinorrhea and sneezing.   Eyes:       Negative for acute change in vision  Respiratory: Negative for chest tightness, shortness of breath and wheezing.  Cardiovascular: Negative for chest pain.  Gastrointestinal: Negative for diarrhea, nausea and vomiting.  Genitourinary: Negative for dysuria, pelvic pain and vaginal discharge.  Musculoskeletal: Negative for neck pain and neck stiffness.  Skin: Negative for rash.  Neurological: Negative for seizures, syncope, weakness and headaches.  Hematological: Negative for adenopathy. Does not bruise/bleed easily.  Psychiatric/Behavioral: Negative for hallucinations.      Objective:    BP 137/90   Pulse 62   Temp 98 F (36.7 C) (Oral)   Ht 5\' 8"  (1.727 m)   Wt 278 lb (126.1 kg)   LMP 11/06/2003   BMI 42.27 kg/m  Nursing note and vital signs reviewed.  Physical Exam  Constitutional: She is oriented to person, place, and time. She appears well-developed. No distress.  HENT:  Right Ear: Hearing, tympanic membrane, external ear and ear  canal normal.  Left Ear: Hearing, tympanic membrane, external ear and ear canal normal.  Nose: Nose normal. Right sinus exhibits no maxillary sinus tenderness and no frontal sinus tenderness. Left sinus exhibits no maxillary sinus tenderness and no frontal sinus tenderness.  Mouth/Throat: Uvula is midline, oropharynx is clear and moist and mucous membranes are normal.  Eyes: Conjunctivae are normal.  Neck: Neck supple.  Cardiovascular: Normal rate, regular rhythm, normal heart sounds and intact distal pulses. Exam reveals no gallop and no friction rub.  No murmur heard. Pulmonary/Chest: Effort normal and breath sounds normal. No respiratory distress. She has no wheezes. She has no rales. She exhibits no tenderness.  Abdominal: Soft. Bowel sounds are normal. There is no tenderness.  Lymphadenopathy:    She has no cervical adenopathy.  Neurological: She is alert and oriented to person, place, and time.  Skin: Skin is warm and dry. No rash noted.  Psychiatric: She has a normal mood and affect.       Assessment & Plan:   Problem List Items Addressed This Visit      Respiratory   Seasonal allergic rhinitis due to pollen    Ms. Balaban's symptoms and exam are consistent with allergic rhinitis. There does not appear to be any bacterial infection. Recommend to continue Claratin. Will prescribe a prednisone dose pack to help with her symptoms. Can continue to take OTC medications as needed for symptom relief and supportive care. Follow up if symptoms worsen or do not improve.         Other   Human immunodeficiency virus (HIV) disease ([place]) - Primary    Ms. Bradfield appears to be well controlled with her current regimen with no adverse side effects. She is adherent to the regimen and has no problems obtaining or taking her medications. She has no signs/symptoms of opportunistic infection through history or physical exam. Prevnar was updated today. Continue current dose of Juluca and Prezcobix.  Follow up in 4 months or sooner if needed.       Relevant Medications   darunavir-cobicistat (PREZCOBIX) 800-150 MG tablet   Dolutegravir-Rilpivirine (JULUCA) 50-25 MG TABS   HYPERLIPIDEMIA, MIXED    LDL remains slightly elevated with most recent blood work showing a level of 116. She continues to take the pitavastatin as prescribed with no myalgias. Encouraged a nutritional management through decreasing saturated fats and processed/sugary foods. Continue to monitor.           I have discontinued [facility] Fanny. Robin's JULUCA. I have also changed her PREZCOBIX to darunavir-cobicistat. Additionally, I am having her start on predniSONE. Lastly, I am having her maintain her loratadine, Pitavastatin Calcium, Vitamin D3, Omega-3  Fatty Acids (FISH OIL PO), Probiotic Product (PROBIOTIC PO), levothyroxine, CALCIUM-VITAMIN D PO, HAIR/SKIN/NAILS, maraviroc, HYDROcodone-acetaminophen, olmesartan-hydrochlorothiazide, and Dolutegravir-Rilpivirine.   Meds ordered this encounter  Medications  . predniSONE (STERAPRED UNI-PAK 21 TAB) 5 MG (21) TBPK tablet    Sig: Take medication as directed on package.    Dispense:  21 tablet    Refill:  0    Order Specific Question:   Supervising Provider    Answer:   Carlyle Basques [[id number]]  . darunavir-cobicistat (PREZCOBIX) 800-150 MG tablet    Sig: TAKE 1 TABLET BY MOUTH DAILY. SWALLOW WHOLE. DO NOT CRUSH, BREAK OR CHEW TABLETS. TAKE WITH FOOD.    Dispense:  30 tablet    Refill:  5    Order Specific Question:   Supervising Provider    Answer:   Carlyle Basques [[id number]]  . Dolutegravir-Rilpivirine (JULUCA) 50-25 MG TABS    Sig: Take 1 tablet by mouth daily with breakfast. Take with the Prezcobix.    Dispense:  30 tablet    Refill:  5    Order Specific Question:   Supervising Provider    Answer:   Carlyle Basques [[id number]]     Follow-up: Return in about 4 months (around 05/19/2018), or if symptoms worsen or fail to improve.   Terri Piedra, MSN, [facility]

## 2018-01-16 NOTE — Assessment & Plan Note (Signed)
Ms. Hulick appears to be well controlled with her current regimen with no adverse side effects. She is adherent to the regimen and has no problems obtaining or taking her medications. She has no signs/symptoms of opportunistic infection through history or physical exam. Prevnar was updated today. Continue current dose of Juluca and Prezcobix. Follow up in 4 months or sooner if needed.

## 2018-01-16 NOTE — Assessment & Plan Note (Signed)
LDL remains slightly elevated with most recent blood work showing a level of 116. She continues to take the pitavastatin as prescribed with no myalgias. Encouraged a nutritional management through decreasing saturated fats and processed/sugary foods. Continue to monitor.

## 2018-01-16 NOTE — Assessment & Plan Note (Signed)
Kayla Price symptoms and exam are consistent with allergic rhinitis. There does not appear to be any bacterial infection. Recommend to continue Claratin. Will prescribe a prednisone dose pack to help with her symptoms. Can continue to take OTC medications as needed for symptom relief and supportive care. Follow up if symptoms worsen or do not improve.

## 2018-01-16 NOTE — Patient Instructions (Signed)
Nice to meet you.  Please continue to take your Juluca and Prezcobix as prescribed.  We will work to coordinate lab work.   Plan for follow up in 4 months or sooner if needed with Dr. Tommy Medal.  It appears your symptoms are related to allergies. Please continue to take the Claratin. Can probably stop the Guaifenisen.   I will send in a prescription for prednisone to help with your symptoms. Please let us know if your symptoms do not improve.

## 2018-02-06 MED FILL — OLMESARTAN-HCTZ 40-25 MG TA: 40-25 | 30 days supply | Qty: 30 | Fill #1

## 2018-02-06 MED FILL — SELZENTRY 150 MG TABS: 150 | 30 days supply | Qty: 60 | Fill #7

## 2018-02-06 MED FILL — JULUCA 50-25 MG TAB: 50-25 | 30 days supply | Qty: 30 | Fill #1

## 2018-02-06 MED FILL — PREZCOBIX 800 MG-150 MG TAB: 800-150 | 30 days supply | Qty: 30 | Fill #8

## 2018-02-26 ENCOUNTER — Other Ambulatory Visit: Payer: Self-pay

## 2018-02-26 DIAGNOSIS — M1990 Unspecified osteoarthritis, unspecified site: Secondary | ICD-10-CM

## 2018-02-26 MED ORDER — HYDROCODONE-ACETAMINOPHEN 5-325 MG PO TABS: ORAL_TABLET | ORAL | 0 refills | Status: DC

## 2018-02-26 NOTE — Telephone Encounter (Signed)
Patient called requesting refill.  [place] Database verified and compliance confirmed

## 2018-02-27 ENCOUNTER — Other Ambulatory Visit: Payer: Self-pay | Admitting: Oncology

## 2018-02-27 MED FILL — HYDROCODON-APAP 5-325: 5-325 | 15 days supply | Qty: 60 | Fill #0

## 2018-03-06 DIAGNOSIS — M25562 Pain in left knee: Secondary | ICD-10-CM | POA: Diagnosis not present

## 2018-03-06 DIAGNOSIS — M65312 Trigger thumb, left thumb: Secondary | ICD-10-CM | POA: Diagnosis not present

## 2018-03-06 DIAGNOSIS — M25572 Pain in left ankle and joints of left foot: Secondary | ICD-10-CM | POA: Diagnosis not present

## 2018-03-06 DIAGNOSIS — Z96652 Presence of left artificial knee joint: Secondary | ICD-10-CM | POA: Diagnosis not present

## 2018-03-07 ENCOUNTER — Encounter: Payer: Self-pay | Admitting: Endocrinology

## 2018-03-07 ENCOUNTER — Other Ambulatory Visit: Payer: Self-pay | Admitting: Endocrinology

## 2018-03-07 ENCOUNTER — Ambulatory Visit (INDEPENDENT_AMBULATORY_CARE_PROVIDER_SITE_OTHER): Payer: Medicare HMO | Admitting: Endocrinology

## 2018-03-07 VITALS — BP 110/82 | HR 68 | Ht 68.0 in | Wt 276.2 lb

## 2018-03-07 DIAGNOSIS — E890 Postprocedural hypothyroidism: Secondary | ICD-10-CM

## 2018-03-07 LAB — T4, FREE: Free T4: 1.13 ng/dL (ref 0.60–1.60)

## 2018-03-07 LAB — TSH: TSH: 0.3 u[IU]/mL — ABNORMAL LOW (ref 0.35–4.50)

## 2018-03-07 MED ORDER — LEVOTHYROXINE SODIUM 150 MCG PO TABS: 150.0000 ug | ORAL_TABLET | Freq: Every day | ORAL | 3 refills | Status: DC

## 2018-03-07 NOTE — Patient Instructions (Signed)
blood tests are requested for you today.  We'll let you know about the results. Please come back for a follow-up appointment in 1 year.

## 2018-03-07 NOTE — Progress Notes (Signed)
Subjective:    Patient ID: Kayla Price, female    DOB: Jul 06, 1955, 62 y.o.   MRN: [id number]  HPI Pt returns for f/u of post-RAI hypothyroidism (she had RAI for hyperthyroidism due to [place] in 2009, and now takes synthroid).  she says Synthroid is 175 mcg/d.  She says she never misses it.  pt states she feels well in general.  Past Medical History:  Diagnosis Date  . Alopecia areata 11/28/2009  . Bell's palsy   . Breast cancer ([place])   . BREAST CANCER, HX OF 2005  . Cancer [facility]) 2005   Breast cancer   chemotherapy and radiation  . CKD (chronic kidney disease) stage 3, GFR 30-59 ml/min (HCC) 06/08/2015  . Dry eye syndrome   . Family history of lung cancer   . Family history of non-Hodgkin's lymphoma   . Fasting hyperglycemia   . HIP FRACTURE, RIGHT 05/06/2008  . History of kidney stones   . HIV (human immunodeficiency virus infection) ([place])   . HIV DISEASE 03/27/2006  . HYPERLIPIDEMIA, MIXED 12/15/2007  . HYPERTENSION 03/27/2006  . 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 MENORRHAGIA, POSTMENOPAUSAL 02/03/2009  . Osteoarthritis of left knee 06/08/2015  . OSTEOARTHROSIS, LOCAL, SCND, UNSPC SITE 04/07/2007  . PVD 04/07/2007  . Tinea capitis   . TRIGGER FINGER 05/06/2008  . Unspecified vitamin D deficiency 08/06/2007    Past Surgical History:  Procedure Laterality Date  . BREAST LUMPECTOMY Right    2005  . COLONOSCOPY    . COLONOSCOPY WITH ESOPHAGOGASTRODUODENOSCOPY (EGD)    . HYSTEROSCOPY  2006  . KNEE ARTHROSCOPY Left 2002  . LYMPH NODE DISSECTION  2005  . MASTECTOMY MODIFIED RADICAL Right 11/05/2016   Procedure: RIGHT MASTECTOMY MODIFIED RADICAL;  Surgeon: Fanny Skates, MD;  Location: [place];  Service: General;  Laterality: Right;  . MODIFIED RADICAL MASTECTOMY Right 11/05/2016  . placement   of port-a-cath    . PORT-A-CATH REMOVAL  2006   insertion 2005  . PORT-A-CATH REMOVAL N/A 03/29/2017   Procedure: REMOVAL PORT-A-CATH;  Surgeon: Fanny Skates, MD;   Location: [place];  Service: General;  Laterality: N/A;  . PORTACATH PLACEMENT Right 11/05/2016   Procedure: INSERTION PORT-A-CATH WITH ULTRA SOUND;  Surgeon: Fanny Skates, MD;  Location: [place];  Service: General;  Laterality: Right;  . removal of port a cath  12/2014  . THYROID SURGERY  2009   Ablation   . TOTAL KNEE ARTHROPLASTY Left 02/06/2016   Procedure: TOTAL KNEE ARTHROPLASTY;  Surgeon: Frederik Pear, MD;  Location: [place];  Service: Orthopedics;  Laterality: Left;  . TUBAL LIGATION      Social History   Socioeconomic History  . Marital status: Widowed    Spouse name: Not on file  . Number of children: Not on file  . Years of education: Not on file  . Highest education level: Not on file  Occupational History  . Occupation: Paediatric nurse: UNEMPLOYED  Social Needs  . Financial resource strain: Not hard at all  . Food insecurity:    Worry: Never true    Inability: Never true  . Transportation needs:    Medical: No    Non-medical: No  Tobacco Use  . Smoking status: Never Smoker  . Smokeless tobacco: Never Used  Substance and Sexual Activity  . Alcohol use: No  . Drug use: No  . Sexual activity: Not Currently    Birth control/protection: Post-menopausal    Comment: declined condoms  Lifestyle  .  Physical activity:    Days per week: 3 days    Minutes per session: 120 min  . Stress: Not at all  Relationships  . Social connections:    Talks on phone: More than three times a week    Gets together: More than three times a week    Attends religious service: More than 4 times per year    Active member of club or organization: No    Attends meetings of clubs or organizations: Never    Relationship status: Widowed  . Intimate partner violence:    Fear of current or ex partner: No    Emotionally abused: No    Physically abused: No    Forced sexual activity: No  Other Topics Concern  . Not on file  Social History Narrative   Single/widow   Diet: good   Do  you drink/eat things with caffeine? Tea occasionally   Marital status: Widowed  What year were you married? 1993   Do you live in a house, apartment,assisted living, condo,trailer,ect.)? House   Is it one or more stories? Two   How many persons live in your home? 4   Do you have any pets in you home? No   Current or past profession: Tour manager   Do you exercise? Yes  Type&how often: Stationary Bike, Water Exercise 3-4 x week   Do you have a living will? Yes   Do you have a DNR form? No  If not do you what one?   Do you have signed POA/HPOA forms? No   If so, please bring to your appointment.                Current Outpatient Medications on File Prior to Visit  Medication Sig Dispense Refill  . CALCIUM-VITAMIN D PO Take 1 tablet by mouth as needed.     . Cholecalciferol (VITAMIN D3) 2000 units TABS Take 2,000 Units by mouth daily with lunch.    . darunavir-cobicistat (PREZCOBIX) 800-150 MG tablet TAKE 1 TABLET BY MOUTH DAILY. SWALLOW WHOLE. DO NOT CRUSH, BREAK OR CHEW TABLETS. TAKE WITH FOOD. 30 tablet 5  . Dolutegravir-Rilpivirine (JULUCA) 50-25 MG TABS Take 1 tablet by mouth daily with breakfast. Take with the Prezcobix. 30 tablet 5  . HYDROcodone-acetaminophen (NORCO/VICODIN) 5-325 MG tablet TAKE 1 TABLET BY MOUTH EVERY 6 HOURS AS NEEDED FOR MODERATE PAIN 60 tablet 0  . loratadine (CLARITIN) 10 MG tablet Take 10 mg by mouth as needed for itching. Reported on 06/08/2015    . maraviroc (SELZENTRY) 150 MG tablet Take 1 tablet (150 mg total) by mouth 2 (two) times daily. 60 tablet 11  . MELOXICAM PO Take by mouth.    . Multiple Vitamins-Minerals (HAIR/SKIN/NAILS) TABS Take 1 tablet by mouth daily.    Marland Kitchen olmesartan-hydrochlorothiazide (BENICAR HCT) 40-25 MG tablet TAKE 1 TABLET BY MOUTH DAILY. 30 tablet 1  . Omega-3 Fatty Acids (FISH OIL PO) Take 1 capsule by mouth daily with lunch.    . Pitavastatin Calcium 4 MG TABS Take 1 tablet (4 mg total) by mouth daily. This may be placebo, study  provided. Do not dispense. 30 tablet 11  . Probiotic Product (PROBIOTIC PO) Take 1 capsule by mouth daily with lunch.     No current facility-administered medications on file prior to visit.     Allergies  Allergen Reactions  . Lisinopril Anaphylaxis and Swelling    Swelling of tongue and mouth 11/05/16- tolerates Olmesartan  . Pepcid [Famotidine] Other (See Comments)  PPI H2, BLOCKERS LOWER GASTRIC PH WHICH WOULD LEAD TO SUBTHERAPEUTIC RILPIVIRINE LEVELS AND POTENTIAL VIROLOGICAL FAILURE WITH RESISTANCE  . Prilosec [Omeprazole] Other (See Comments)    PPI H2, BLOCKERS LOWER GASTRIC PH WHICH WOULD LEAD TO SUBTHERAPEUTIC RILPIVIRINE LEVELS AND POTENTIAL VIROLOGICAL FAILURE WITH RESISTANCE  . Tums [Calcium Carbonate Antacid] Other (See Comments)    TUMS ANTACIDS CAN LOWER GASTRIC PH WHICH COULD  LEAD TO SUBTHERAPEUTIC RILPIVIRINE LEVELS AND POTENTIAL VIROLOGICAL FAILURE WITH RESISTANCE TUMS CAN BE GIVEN BUT NEED CONSULT WITH ID PHARMACY RE TIMING. I PREFER HER TO AVOID ALL TOGETHER    Family History  Problem Relation Age of Onset  . Heart attack Brother        Massive MI in 12s  . Stroke Brother        CAD  . Kidney disease Mother   . Stroke Mother   . Diabetes Mother   . Liver disease Sister   . COPD Sister        had I-131 rx of hyperthyroidism  . Diabetes Sister   . Stroke Sister   . Lung cancer Paternal Uncle        hx smoking  . Cancer Cousin   . Non-Hodgkin's lymphoma Cousin 25       cancer x3, in prostate and lung- unsure if met/spread or if primaries  . Heart failure Father   . Heart disease Father   . Arthritis Father   . Sarcoidosis Sister     BP 110/82 (BP Location: Left Arm, Patient Position: Sitting)   Pulse 68   Ht '5\' 8"'  (1.727 m)   Wt 276 lb 3.2 oz (125.3 kg)   LMP 11/06/2003   SpO2 95%   BMI 42.00 kg/m    Review of Systems She has gained weight.      Objective:   Physical Exam VITAL SIGNS:  See vs page GENERAL: no distress NECK: There is no  palpable thyroid enlargement.  No thyroid nodule is palpable.  No palpable lymphadenopathy at the anterior neck.   Lab Results  Component Value Date   TSH 0.30 (L) 03/07/2018   T3TOTAL 271.2 (H) 12/15/2007   T4TOTAL 13.9 (H) 12/15/2007      Assessment & Plan:  Hypothyroidism: overreplaced.  I have sent a prescription to your pharmacy, to reduce synthroid

## 2018-03-10 MED FILL — SELZENTRY 150 MG TABS: 150 | 30 days supply | Qty: 60 | Fill #8

## 2018-03-10 MED FILL — OLMESARTAN-HCTZ 40-25 MG TA: 40-25 | 30 days supply | Qty: 30 | Fill #0

## 2018-03-10 MED FILL — JULUCA 50-25 MG TAB: 50-25 | 30 days supply | Qty: 30 | Fill #2

## 2018-03-10 MED FILL — PREZCOBIX 800 MG-150 MG TAB: 800-150 | 30 days supply | Qty: 30 | Fill #9

## 2018-03-26 ENCOUNTER — Other Ambulatory Visit: Payer: Medicare HMO

## 2018-03-26 ENCOUNTER — Encounter (INDEPENDENT_AMBULATORY_CARE_PROVIDER_SITE_OTHER): Payer: Medicare HMO | Admitting: *Deleted

## 2018-03-26 VITALS — BP 151/95 | HR 69 | Temp 97.6°F | Wt 280.2 lb

## 2018-03-26 DIAGNOSIS — Z006 Encounter for examination for normal comparison and control in clinical research program: Secondary | ICD-10-CM

## 2018-03-26 NOTE — Research (Signed)
Kayla Price was here for her month 24 visit for [facility]. She says her left knee has been bothering her a little lately, but she has been to ortho and had it checked out and now taking meloxicam. She has a trigger finger on her left thumb that she is wearing a brace for and may need surgery. She says her adherence is excellent and will be returning in January.

## 2018-04-01 ENCOUNTER — Other Ambulatory Visit: Payer: Self-pay | Admitting: Infectious Disease

## 2018-04-01 DIAGNOSIS — B20 Human immunodeficiency virus [HIV] disease: Secondary | ICD-10-CM

## 2018-04-04 MED FILL — PREZCOBIX 800 MG-150 MG TAB: 800-150 | 30 days supply | Qty: 30 | Fill #0

## 2018-04-04 MED FILL — SELZENTRY 150 MG TABS: 150 | 30 days supply | Qty: 60 | Fill #9

## 2018-04-04 MED FILL — OLMESARTAN-HCTZ 40-25 MG TA: 40-25 | 30 days supply | Qty: 30 | Fill #1

## 2018-04-04 MED FILL — JULUCA 50-25 MG TAB: 50-25 | 30 days supply | Qty: 30 | Fill #0

## 2018-04-14 ENCOUNTER — Ambulatory Visit (INDEPENDENT_AMBULATORY_CARE_PROVIDER_SITE_OTHER)
Admission: EM | Admit: 2018-04-14 | Discharge: 2018-04-14 | Disposition: A | Discharge status: Home / Self Care | Payer: Medicare HMO | Source: Home / Self Care

## 2018-04-14 ENCOUNTER — Emergency Department (HOSPITAL_COMMUNITY): Payer: Medicare HMO

## 2018-04-14 ENCOUNTER — Emergency Department (HOSPITAL_COMMUNITY)
Admission: EM | Admit: 2018-04-14 | Discharge: 2018-04-14 | Disposition: A | Discharge status: Home / Self Care | Payer: Medicare HMO | Attending: Emergency Medicine | Admitting: Emergency Medicine

## 2018-04-14 ENCOUNTER — Encounter (HOSPITAL_COMMUNITY): Payer: Self-pay | Admitting: Emergency Medicine

## 2018-04-14 ENCOUNTER — Ambulatory Visit (INDEPENDENT_AMBULATORY_CARE_PROVIDER_SITE_OTHER): Payer: Medicare HMO

## 2018-04-14 ENCOUNTER — Other Ambulatory Visit: Payer: Self-pay

## 2018-04-14 ENCOUNTER — Encounter (HOSPITAL_COMMUNITY): Payer: Self-pay | Admitting: *Deleted

## 2018-04-14 DIAGNOSIS — J90 Pleural effusion, not elsewhere classified: Secondary | ICD-10-CM

## 2018-04-14 DIAGNOSIS — R0602 Shortness of breath: Secondary | ICD-10-CM

## 2018-04-14 DIAGNOSIS — Z79899 Other long term (current) drug therapy: Secondary | ICD-10-CM | POA: Insufficient documentation

## 2018-04-14 DIAGNOSIS — R091 Pleurisy: Secondary | ICD-10-CM | POA: Diagnosis not present

## 2018-04-14 DIAGNOSIS — I129 Hypertensive chronic kidney disease with stage 1 through stage 4 chronic kidney disease, or unspecified chronic kidney disease: Secondary | ICD-10-CM | POA: Diagnosis not present

## 2018-04-14 DIAGNOSIS — N183 Chronic kidney disease, stage 3 (moderate): Secondary | ICD-10-CM | POA: Diagnosis not present

## 2018-04-14 DIAGNOSIS — J918 Pleural effusion in other conditions classified elsewhere: Secondary | ICD-10-CM | POA: Diagnosis not present

## 2018-04-14 HISTORY — PX: IR THORACENTESIS ASP PLEURAL SPACE W/IMG GUIDE: IMG5380

## 2018-04-14 LAB — BASIC METABOLIC PANEL
Anion gap: 10 (ref 5–15)
BUN: 16 mg/dL (ref 8–23)
CHLORIDE: 101 mmol/L (ref 98–111)
CO2: 28 mmol/L (ref 22–32)
Calcium: 10.5 mg/dL — ABNORMAL HIGH (ref 8.9–10.3)
Creatinine, Ser: 1.64 mg/dL — ABNORMAL HIGH (ref 0.44–1.00)
GFR calc Af Amer: 38 mL/min — ABNORMAL LOW (ref >60)
GFR calc non Af Amer: 33 mL/min — ABNORMAL LOW (ref >60)
Glucose, Bld: 100 mg/dL — ABNORMAL HIGH (ref 70–99)
POTASSIUM: 4.3 mmol/L (ref 3.5–5.1)
Sodium: 139 mmol/L (ref 135–145)

## 2018-04-14 LAB — LACTATE DEHYDROGENASE, PLEURAL OR PERITONEAL FLUID: LD, Fluid: 423 U/L — ABNORMAL HIGH (ref 3–23)

## 2018-04-14 LAB — BODY FLUID CELL COUNT WITH DIFFERENTIAL
Eos, Fluid: 0 %
Lymphs, Fluid: 63 %
Monocyte-Macrophage-Serous Fluid: 37 % — ABNORMAL LOW (ref 50–90)
NEUTROPHIL FLUID: 0 % (ref 0–25)
WBC FLUID: 1750 uL — AB (ref 0–1000)

## 2018-04-14 LAB — CBC
HEMATOCRIT: 51.4 % — AB (ref 36.0–46.0)
HEMOGLOBIN: 16.4 g/dL — AB (ref 12.0–15.0)
MCH: 31.7 pg (ref 26.0–34.0)
MCHC: 31.9 g/dL (ref 30.0–36.0)
MCV: 99.4 fL (ref 80.0–100.0)
Platelets: 228 10*3/uL (ref 150–400)
RBC: 5.17 MIL/uL — ABNORMAL HIGH (ref 3.87–5.11)
RDW: 13.2 % (ref 11.5–15.5)
WBC: 5.3 10*3/uL (ref 4.0–10.5)
nRBC: 0 % (ref 0.0–0.2)

## 2018-04-14 LAB — GRAM STAIN

## 2018-04-14 LAB — PROTEIN, PLEURAL OR PERITONEAL FLUID: TOTAL PROTEIN, FLUID: 5.5 g/dL

## 2018-04-14 LAB — I-STAT TROPONIN, ED: Troponin i, poc: 0 ng/mL (ref 0.00–0.08)

## 2018-04-14 MED ORDER — LIDOCAINE HCL 1 % IJ SOLN
INTRAMUSCULAR | Status: DC | PRN
  Administered 2018-04-14: 10 mL

## 2018-04-14 MED ORDER — LIDOCAINE HCL 1 % IJ SOLN
INTRAMUSCULAR | Status: AC
  Filled 2018-04-14: qty 20

## 2018-04-14 NOTE — ED Provider Notes (Addendum)
[facility] EMERGENCY DEPARTMENT Provider Note   CSN: [id number] Arrival date & time: 04/14/18  0941     History   Chief Complaint Chief Complaint  Patient presents with  . Shortness of Breath    HPI Kayla Price is a 63 y.o. female with a past medical history of stage III CKD, breast cancer in remission since September 2018, HIV with last CD4 count of 690 on 12/2017, hypertension, hyperlipidemia, who presents to ED for evaluation of shortness of breath.  Describes the sensation as "not able to take a full breath in."  Symptoms have been going on for 8 days.  They have persisted but not worsened.  She has had to sleep while sitting up for the past 8 days intermittently.  Has not taken any medication help with her symptoms.  No history of similar symptoms in the past.  Does not use inhalers at home.  Denies any chest pain, abdominal pain, vomiting, fever, URI symptoms, cough.    HPI  Past Medical History:  Diagnosis Date  . Alopecia areata 11/28/2009  . Bell's palsy   . Cancer [facility]) 2005   Breast cancer   chemotherapy and radiation  . CKD (chronic kidney disease) stage 3, GFR 30-59 ml/min (HCC) 06/08/2015  . Dry eye syndrome   . Family history of lung cancer   . Family history of non-Hodgkin's lymphoma   . Fasting hyperglycemia   . HIP FRACTURE, RIGHT 05/06/2008  . History of kidney stones   . HIV DISEASE 03/27/2006  . HYPERLIPIDEMIA, MIXED 12/15/2007  . HYPERTENSION 03/27/2006  . 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 MENORRHAGIA, POSTMENOPAUSAL 02/03/2009  . Osteoarthritis of left knee 06/08/2015  . OSTEOARTHROSIS, LOCAL, SCND, UNSPC SITE 04/07/2007  . PVD 04/07/2007  . Tinea capitis   . TRIGGER FINGER 05/06/2008  . Unspecified vitamin D deficiency 08/06/2007    Patient Active Problem List   Diagnosis Date Noted  . Seasonal allergic rhinitis due to pollen 01/16/2018  . Aortic atherosclerosis ([place]) 11/11/2017  . Morbid obesity with BMI of  40.0-44.9, adult ([place]) 11/11/2017  . Genetic testing 06/27/2017  . Family history of non-Hodgkin's lymphoma   . Family history of lung cancer   . Endometrial thickening on ultrasound 01/25/2017  . Recurrent cancer of right breast ([place]) 11/05/2016  . Malignant neoplasm of upper-outer quadrant of right breast in female, estrogen receptor negative ([place]) 10/09/2016  . Primary localized osteoarthritis of left knee 02/06/2016  . Primary osteoarthritis of left knee 02/05/2016  . Osteoarthritis of left knee 06/08/2015  . CKD (chronic kidney disease) stage 3, GFR 30-59 ml/min (HCC) 06/08/2015  . Vitamin D deficiency 09/23/2014  . Acute renal insufficiency 04/23/2014  . 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 Bell's palsy 12/04/2012  . Contact dermatitis 08/29/2011  . Dry eyes 08/20/2011  . Dry mouth 08/20/2011  . Neuropathy 04/09/2011  . Insomnia 04/09/2011  . Obesities, morbid ([place]) 09/27/2010  . Endometrial polyp 12/22/2009  . ALOPECIA AREATA 11/28/2009  . MENORRHAGIA, POSTMENOPAUSAL 02/03/2009  . ABNORMAL GLANDULAR PAPANICOLAOU SMEAR OF CERVIX 11/04/2008  . ALOPECIA 05/06/2008  . Trigger finger, acquired 05/06/2008  . HIP FRACTURE, RIGHT 05/06/2008  . ARTHROSCOPY, LEFT KNEE, HX OF 02/03/2008  . HYPERLIPIDEMIA, MIXED 12/15/2007  . HAND PAIN, RIGHT 12/15/2007  . Other abnormal glucose 12/15/2007  . UNSPECIFIED VITAMIN D DEFICIENCY 08/06/2007  . TINEA CAPITIS 08/04/2007  . CNTC DERMATITIS&OTH ECZEMA DUE OTH CHEM PRODUCTS 08/04/2007  . PVD 04/07/2007  . Secondary localized osteoarthrosis 04/07/2007  . Human immunodeficiency  virus (HIV) disease ([place]) 03/27/2006  . Essential hypertension 03/27/2006  . BREAST CANCER, HX OF 03/27/2006    Past Surgical History:  Procedure Laterality Date  . BREAST LUMPECTOMY Right    2005  . COLONOSCOPY    . COLONOSCOPY WITH ESOPHAGOGASTRODUODENOSCOPY (EGD)    . HYSTEROSCOPY  2006  . KNEE ARTHROSCOPY Left 2002  . LYMPH NODE DISSECTION  2005  .  MASTECTOMY MODIFIED RADICAL Right 11/05/2016   Procedure: RIGHT MASTECTOMY MODIFIED RADICAL;  Surgeon: Fanny Skates, MD;  Location: [facility];  Service: General;  Laterality: Right;  . MODIFIED RADICAL MASTECTOMY Right 11/05/2016  . placement   of port-a-cath    . PORT-A-CATH REMOVAL  2006   insertion 2005  . PORT-A-CATH REMOVAL N/A 03/29/2017   Procedure: REMOVAL PORT-A-CATH;  Surgeon: Fanny Skates, MD;  Location: [facility];  Service: General;  Laterality: N/A;  . PORTACATH PLACEMENT Right 11/05/2016   Procedure: INSERTION PORT-A-CATH WITH ULTRA SOUND;  Surgeon: Fanny Skates, MD;  Location: [facility];  Service: General;  Laterality: Right;  . removal of port a cath  12/2014  . THYROID SURGERY  2009   Ablation   . TOTAL KNEE ARTHROPLASTY Left 02/06/2016   Procedure: TOTAL KNEE ARTHROPLASTY;  Surgeon: Frederik Pear, MD;  Location: [facility];  Service: Orthopedics;  Laterality: Left;  . TUBAL LIGATION       OB History    Gravida  4   Para  2   Term  2   Preterm      AB  2   Living  2     SAB      TAB  2   Ectopic      Multiple      Live Births               Home Medications    Prior to Admission medications   Medication Sig Start Date End Date Taking? Authorizing Provider  CALCIUM-VITAMIN D PO Take 1 tablet by mouth as needed.     [provider]  Cholecalciferol (VITAMIN D3) 2000 units TABS Take 2,000 Units by mouth daily with lunch.    [provider]  darunavir-cobicistat (PREZCOBIX) 800-150 MG tablet TAKE 1 TABLET BY MOUTH DAILY. SWALLOW WHOLE. DO NOT CRUSH, BREAK OR CHEW TABLETS. TAKE WITH FOOD. 01/16/18   Golden Circle, FNP  Dolutegravir-Rilpivirine (JULUCA) 50-25 MG TABS Take 1 tablet by mouth daily with breakfast. Take with the Prezcobix. 01/16/18   Golden Circle, FNP  HYDROcodone-acetaminophen (NORCO/VICODIN) 5-325 MG tablet TAKE 1 TABLET BY MOUTH EVERY 6 HOURS AS NEEDED FOR MODERATE PAIN 02/26/18   Gildardo Cranker, DO  levothyroxine (SYNTHROID,  LEVOTHROID) 150 MCG tablet Take 1 tablet (150 mcg total) by mouth daily before breakfast. 03/07/18   Renato Shin, MD  loratadine (CLARITIN) 10 MG tablet Take 10 mg by mouth as needed for itching. Reported on 06/08/2015    [provider]  maraviroc (SELZENTRY) 150 MG tablet Take 1 tablet (150 mg total) by mouth 2 (two) times daily. 07/04/17   Truman Hayward, MD  MELOXICAM PO Take by mouth.    [provider]  Multiple Vitamins-Minerals (HAIR/SKIN/NAILS) TABS Take 1 tablet by mouth daily.    [provider]  olmesartan-hydrochlorothiazide (BENICAR HCT) 40-25 MG tablet TAKE 1 TABLET BY MOUTH DAILY. 02/27/18   Magrinat, Virgie Dad, MD  Omega-3 Fatty Acids (FISH OIL PO) Take 1 capsule by mouth daily with lunch.    [provider]  Pitavastatin Calcium  4 MG TABS Take 1 tablet (4 mg total) by mouth daily. This may be placebo, study provided. Do not dispense. 12/07/15   Truman Hayward, MD  Probiotic Product (PROBIOTIC PO) Take 1 capsule by mouth daily with lunch.    [provider]    Family History Family History  Problem Relation Age of Onset  . Heart attack Brother        Massive MI in 39s  . Stroke Brother        CAD  . Kidney disease Mother   . Stroke Mother   . Diabetes Mother   . Liver disease Sister   . COPD Sister        had I-131 rx of hyperthyroidism  . Diabetes Sister   . Stroke Sister   . Lung cancer Paternal Uncle        hx smoking  . Cancer Cousin   . Non-Hodgkin's lymphoma Cousin 25       cancer x3, in prostate and lung- unsure if met/spread or if primaries  . Heart failure Father   . Heart disease Father   . Arthritis Father   . Sarcoidosis Sister     Social History Social History   Tobacco Use  . Smoking status: Never Smoker  . Smokeless tobacco: Never Used  Substance Use Topics  . Alcohol use: No  . Drug use: No     Allergies   Lisinopril; Pepcid [famotidine]; Prilosec [omeprazole]; and Tums [calcium  carbonate antacid]   Review of Systems Review of Systems  Constitutional: Negative for appetite change, chills and fever.  HENT: Negative for ear pain, rhinorrhea, sneezing and sore throat.   Eyes: Negative for photophobia and visual disturbance.  Respiratory: Positive for shortness of breath. Negative for cough, chest tightness and wheezing.   Cardiovascular: Negative for chest pain and palpitations.  Gastrointestinal: Negative for abdominal pain, blood in stool, constipation, diarrhea, nausea and vomiting.  Genitourinary: Negative for dysuria, hematuria and urgency.  Musculoskeletal: Negative for myalgias.  Skin: Negative for rash.  Neurological: Negative for dizziness, weakness and light-headedness.     Physical Exam Updated Vital Signs BP 139/83 (BP Location: Left Arm) Comment: post-thoracentesis   Pulse 74   Temp 98.1 F (36.7 C)   Resp 17   Ht '5\' 7"'  (1.702 m)   Wt 123.8 kg   LMP 11/06/2003   SpO2 98%   BMI 42.76 kg/m   Physical Exam  Constitutional: She appears well-developed and well-nourished. No distress.  HENT:  Head: Normocephalic and atraumatic.  Nose: Nose normal.  Eyes: Conjunctivae and EOM are normal. Left eye exhibits no discharge. No scleral icterus.  Neck: Normal range of motion. Neck supple.  Cardiovascular: Normal rate, regular rhythm, normal heart sounds and intact distal pulses. Exam reveals no gallop and no friction rub.  No murmur heard. Pulmonary/Chest: Effort normal. No respiratory distress. She has decreased breath sounds in the right lower field.  Abdominal: Soft. Bowel sounds are normal. She exhibits no distension. There is no tenderness. There is no guarding.  Musculoskeletal: Normal range of motion. She exhibits no edema.  No lower extremity edema, erythema or calf tenderness bilaterally.  Neurological: She is alert. She exhibits normal muscle tone. Coordination normal.  Skin: Skin is warm and dry. No rash noted.  Psychiatric: She has a  normal mood and affect.  Nursing note and vitals reviewed.    ED Treatments / Results  Labs (all labs ordered are listed, but only abnormal results are displayed)  Labs Reviewed  BASIC METABOLIC PANEL - Abnormal; Notable for the following components:      Result Value   Glucose, Bld 100 (*)    Creatinine, Ser 1.64 (*)    Calcium 10.5 (*)    GFR calc non Af Amer 33 (*)    GFR calc Af Amer 38 (*)    All other components within normal limits  CBC - Abnormal; Notable for the following components:   RBC 5.17 (*)    Hemoglobin 16.4 (*)    HCT 51.4 (*)    All other components within normal limits  GRAM STAIN  CULTURE, BODY FLUID-BOTTLE  LACTATE DEHYDROGENASE, PLEURAL OR PERITONEAL FLUID  BODY FLUID CELL COUNT WITH DIFFERENTIAL  PROTEIN, PLEURAL OR PERITONEAL FLUID  I-STAT TROPONIN, ED  CYTOLOGY - NON PAP    EKG EKG Interpretation  Date/Time:  Monday April 14 2018 09:45:52 EDT Ventricular Rate:  78 PR Interval:  114 QRS Duration: 70 QT Interval:  310 QTC Calculation: 353 R Axis:   24 Text Interpretation:  Normal sinus rhythm Cannot rule out Anterior infarct , age undetermined Abnormal ECG Confirmed by Quintella Reichert [phone]) on 04/14/2018 2:20:03 PM   Radiology Dg Chest 1 View  Result Date: 04/14/2018 CLINICAL DATA:  Status post right thoracentesis. EXAM: CHEST  1 VIEW COMPARISON:  Chest x-ray from same day at 10:19 a.m. FINDINGS: The heart size and mediastinal contours are within normal limits. Normal pulmonary vascularity. Interval decrease in size of now small right pleural effusion with improved aeration of the right lower lobe. The left lung is clear. No pneumothorax. No acute osseous abnormality. IMPRESSION: Decreased now small right pleural effusion.  No pneumothorax. Electronically Signed   By: Titus Dubin M.D.   On: 04/14/2018 16:28   Dg Chest 2 View  Result Date: 04/14/2018 CLINICAL DATA:  Follow-up pleural effusion. EXAM: CHEST - 2 VIEW COMPARISON:   Earlier film, same date. FINDINGS: Persistent moderate-sized right pleural effusion approximately halfway up the thorax. No pneumothorax. The left lung is clear. Heart is normal in size and stable. Moderate atelectasis overlying the effusion. IMPRESSION: Persistent moderate-sized right pleural effusion. Electronically Signed   By: Marijo Sanes M.D.   On: 04/14/2018 10:36   Dg Chest 2 View  Result Date: 04/14/2018 CLINICAL DATA:  Shortness of breath EXAM: CHEST - 2 VIEW COMPARISON:  05/17/2017 FINDINGS: Cardiac shadow is stable in appearance. New right-sided pleural effusion with likely underlying infiltrate is present on the right. The left lung is clear. No bony abnormality is seen. IMPRESSION: Right-sided pleural effusion with likely underlying infiltrate/atelectasis on the right. 50% opacification of the right hemithorax is noted. Electronically Signed   By: Inez Catalina M.D.   On: 04/14/2018 09:12    Procedures Procedures (including critical care time)  Medications Ordered in ED Medications  lidocaine (XYLOCAINE) 1 % (with pres) injection (has no administration in time range)  lidocaine (XYLOCAINE) 1 % (with pres) injection (10 mLs Infiltration Given 04/14/18 1553)     Initial Impression / Assessment and Plan / ED Course  I have reviewed the triage vital signs and the nursing notes.  Pertinent labs & imaging results that were available during my care of the patient were reviewed by me and considered in my medical decision making (see chart for details).  Clinical Course as of Apr 14 1638  Mon Apr 14, 2018  1639 Patient returned from IR.  Reports significant improvement in her symptoms after procedure.  Repeat chest x-ray shows improvement in effusion  with no pneumothorax.   [HK]    Clinical Course User Index [HK] Delia Heady, PA-C    62 year old female with past medical history of stage III CKD, breast cancer in remission since September 2018, HIV with last CD4 count of 690,  hypertension, hyperlipidemia who presents to ED for shortness of breath.  She describes the sensation as "not able to get a full breath in."  Symptoms have been going on for 8 days.  They have persisted but not worsened.  This is caused her to sleep in a sitting position intermittently.  Denies any chest pain, abdominal pain, leg swelling, fever, URI symptoms.  On exam patient is overall well-appearing.  No lower extremity edema or calf tenderness that would concern me for DVT.  Mildly decreased breath sounds on the right lower lung field.  She is afebrile.  She is not tachycardic, tachypneic or hypoxic.  Troponin unremarkable.  BMP with elevation in creatinine secondary to CKD.  CBC unremarkable.  EKG shows normal sinus rhythm.  Chest x-ray shows right-sided pleural effusion with possible underlying infiltrate.  Compared to CT of the chest done last year, this was not present.  I suspect that her symptoms are due to her pleural effusion.  Patient will need to be admitted to hospitalist for thoracentesis.  3:35 PM Spoke to hospitalist.  They feel that patient can undergo an IR guided thoracentesis and have primary care provider follow-up on results.  I feel this is reasonable if her symptoms improve after the procedure.  Will undergo IR guided thoracentesis and reassess.  4:40 PM Patient returned from IR guided thoracentesis.  Repeat chest x-ray shows improvement with minimal effusion and no pneumothorax.  She reports significant improvement in her symptoms.  Patient monitored with continued improvement.  She will need to follow-up with her primary care provider regarding fluid analysis.  Advised to return to ED for any severe worsening symptoms.  Portions of this note were generated with [employer]. Dictation errors may occur despite best attempts at proofreading.   Final Clinical Impressions(s) / ED Diagnoses   Final diagnoses:  Pleural effusion on right    ED Discharge Orders     None        Delia Heady, PA-C 04/14/18 1640    Quintella Reichert, MD 04/15/18 1601

## 2018-04-14 NOTE — ED Notes (Signed)
Pt transported to IR 

## 2018-04-14 NOTE — Progress Notes (Signed)
     Kayla Price:[id number] DOB: [birth year] DOA: 04/14/2018  PCP: Lauree Chandler, NP Consultants:  Loanne Drilling - endocrinology; Tommy Medal - ID; [place] - oncology; [facility] Camel - orthopedics  Called for:  Kayla Price is a 62 y.o. female with medical history significant of HIV; breast cancer s/p R mastectomy and chemo/rads; HTN; HLD; hypothyroidism; PVD; and CKD stage 3 presenting with SOB.  ED Course: R-sided pleural effusion with SOB.  Sent from UC.  For 8 days, sitting upright with sleep, difficulty taking a deep breath.  No h/o asthma/COPD.  Normal O2 sats.  There is some consideration of having IR thoracentesis.  Will call rads to see if this is possible, as she has no other apparent need for admission/obs at this time.  Radiological Exams on Admission: Dg Chest 2 View  Result Date: 04/14/2018 CLINICAL DATA:  Follow-up pleural effusion. EXAM: CHEST - 2 VIEW COMPARISON:  Earlier film, same date. FINDINGS: Persistent moderate-sized right pleural effusion approximately halfway up the thorax. No pneumothorax. The left lung is clear. Heart is normal in size and stable. Moderate atelectasis overlying the effusion. IMPRESSION: Persistent moderate-sized right pleural effusion. Electronically Signed   By: Marijo Sanes M.D.   On: 04/14/2018 10:36   Dg Chest 2 View  Result Date: 04/14/2018 CLINICAL DATA:  Shortness of breath EXAM: CHEST - 2 VIEW COMPARISON:  05/17/2017 FINDINGS: Cardiac shadow is stable in appearance. New right-sided pleural effusion with likely underlying infiltrate is present on the right. The left lung is clear. No bony abnormality is seen. IMPRESSION: Right-sided pleural effusion with likely underlying infiltrate/atelectasis on the right. 50% opacification of the right hemithorax is noted. Electronically Signed   By: Inez Catalina M.D.   On: 04/14/2018 09:12    Patient discussed with PA Shelly Coss.  At this time, the patient has a symptomatic pleural effusion.  However, she is  afebrile, hemodynamically stable, and does not appear to need supplemental O2.  She has a multitude of capable doctors (PCP and specialists) with whom she can f/u.  As such, I recommend a diagnostic and therapeutic thoracentesis with plan for d/c to home afterwards if she has no further issues.  If, following procedure, there appears to be a need for admission, [facility] will be happy to assess the patient at that time.   Karmen Bongo MD [facility]  If note is complete, please contact covering daytime or nighttime physician. [email or web address] Password [email or web address]  04/14/2018, 3:07 PM

## 2018-04-14 NOTE — ED Triage Notes (Signed)
Pt states she has had shortness of breath for 1 week. She has no pain to her chest, denies hx of asthma or COPD. Denies swelling. She feels like she cannot take in a good breath.

## 2018-04-14 NOTE — ED Provider Notes (Signed)
[place]    CSN: [id number] Arrival date & time: 04/14/18  0825     History   Chief Complaint Chief Complaint  Patient presents with  . Shortness of Breath    HPI Kayla Price is a 62 y.o. female.   Kayla Price presents with complaints of shortness of breath. States feels like her lungs are "not expanding" and she gets winded very easily. This started approximately 8 days ago. States she had had some mucus post nasal drip she had swallowed prior to onset, but denies any coughing, runny nose, ear pain or pressure. Had been on meloxicam for orthopedic complaints prior to onset, but is no longer taking. No other new medications. Has not had any cough since. No known fevers. No chest pain. States she could hear a "clicking" sound to superior chest at onset of symptoms but this has resolved. Difficulty sleeping, feels she needs to sleep upright, worse shortness of breath with laying lay. Has had a decreased appetite, no nausea. No peripheral edema. No chest pain. No dizziness. Denies any known cardiac or respiratory history. History of breast cancer, not on any current treatment. Hx of HIV which is well managed with medication. Hx of CKD, kidney stones, hyperlipidemia, htn. Does have family history of sarcoidosis, lung cancer, non-hodgkins lymphoma.     ROS per HPI.      Past Medical History:  Diagnosis Date  . Alopecia areata 11/28/2009  . Bell's palsy   . Breast cancer ([place])   . BREAST CANCER, HX OF 2005  . Cancer [facility]) 2005   Breast cancer   chemotherapy and radiation  . CKD (chronic kidney disease) stage 3, GFR 30-59 ml/min (HCC) 06/08/2015  . Dry eye syndrome   . Family history of lung cancer   . Family history of non-Hodgkin's lymphoma   . Fasting hyperglycemia   . HIP FRACTURE, RIGHT 05/06/2008  . History of kidney stones   . HIV (human immunodeficiency virus infection) (Kayla Price)   . HIV DISEASE 03/27/2006  . HYPERLIPIDEMIA, MIXED 12/15/2007  . HYPERTENSION  03/27/2006  . 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 MENORRHAGIA, POSTMENOPAUSAL 02/03/2009  . Osteoarthritis of left knee 06/08/2015  . OSTEOARTHROSIS, LOCAL, SCND, UNSPC SITE 04/07/2007  . PVD 04/07/2007  . Tinea capitis   . TRIGGER FINGER 05/06/2008  . Unspecified vitamin D deficiency 08/06/2007    Patient Active Problem List   Diagnosis Date Noted  . Seasonal allergic rhinitis due to pollen 01/16/2018  . Aortic atherosclerosis ([place]) 11/11/2017  . Morbid obesity with BMI of 40.0-44.9, adult (Kayla Price) 11/11/2017  . Genetic testing 06/27/2017  . Family history of non-Hodgkin's lymphoma   . Family history of lung cancer   . Endometrial thickening on ultrasound 01/25/2017  . Recurrent cancer of right breast (Kayla Price) 11/05/2016  . Malignant neoplasm of upper-outer quadrant of right breast in female, estrogen receptor negative (Kayla Price) 10/09/2016  . Primary localized osteoarthritis of left knee 02/06/2016  . Primary osteoarthritis of left knee 02/05/2016  . Osteoarthritis of left knee 06/08/2015  . CKD (chronic kidney disease) stage 3, GFR 30-59 ml/min (HCC) 06/08/2015  . Vitamin D deficiency 09/23/2014  . Acute renal insufficiency 04/23/2014  . 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 Bell's palsy 12/04/2012  . Contact dermatitis 08/29/2011  . Dry eyes 08/20/2011  . Dry mouth 08/20/2011  . Neuropathy 04/09/2011  . Insomnia 04/09/2011  . Obesities, morbid ([place]) 09/27/2010  . Endometrial polyp 12/22/2009  . ALOPECIA AREATA 11/28/2009  . MENORRHAGIA, POSTMENOPAUSAL 02/03/2009  . ABNORMAL GLANDULAR  PAPANICOLAOU SMEAR OF CERVIX 11/04/2008  . ALOPECIA 05/06/2008  . Trigger finger, acquired 05/06/2008  . HIP FRACTURE, RIGHT 05/06/2008  . ARTHROSCOPY, LEFT KNEE, HX OF 02/03/2008  . HYPERLIPIDEMIA, MIXED 12/15/2007  . HAND PAIN, RIGHT 12/15/2007  . Other abnormal glucose 12/15/2007  . UNSPECIFIED VITAMIN D DEFICIENCY 08/06/2007  . TINEA CAPITIS 08/04/2007  . CNTC DERMATITIS&OTH ECZEMA  DUE OTH CHEM PRODUCTS 08/04/2007  . PVD 04/07/2007  . Secondary localized osteoarthrosis 04/07/2007  . Human immunodeficiency virus (HIV) disease (Kayla Price) 03/27/2006  . Essential hypertension 03/27/2006  . BREAST CANCER, HX OF 03/27/2006    Past Surgical History:  Procedure Laterality Date  . BREAST LUMPECTOMY Right    2005  . COLONOSCOPY    . COLONOSCOPY WITH ESOPHAGOGASTRODUODENOSCOPY (EGD)    . HYSTEROSCOPY  2006  . KNEE ARTHROSCOPY Left 2002  . LYMPH NODE DISSECTION  2005  . MASTECTOMY MODIFIED RADICAL Right 11/05/2016   Procedure: RIGHT MASTECTOMY MODIFIED RADICAL;  Surgeon: Fanny Skates, MD;  Location: [place];  Service: General;  Laterality: Right;  . MODIFIED RADICAL MASTECTOMY Right 11/05/2016  . placement   of port-a-cath    . PORT-A-CATH REMOVAL  2006   insertion 2005  . PORT-A-CATH REMOVAL N/A 03/29/2017   Procedure: REMOVAL PORT-A-CATH;  Surgeon: Fanny Skates, MD;  Location: [place];  Service: General;  Laterality: N/A;  . PORTACATH PLACEMENT Right 11/05/2016   Procedure: INSERTION PORT-A-CATH WITH ULTRA SOUND;  Surgeon: Fanny Skates, MD;  Location: [place];  Service: General;  Laterality: Right;  . removal of port a cath  12/2014  . THYROID SURGERY  2009   Ablation   . TOTAL KNEE ARTHROPLASTY Left 02/06/2016   Procedure: TOTAL KNEE ARTHROPLASTY;  Surgeon: Frederik Pear, MD;  Location: [place];  Service: Orthopedics;  Laterality: Left;  . TUBAL LIGATION      OB History    Gravida  4   Para  2   Term  2   Preterm      AB  2   Living  2     SAB      TAB  2   Ectopic      Multiple      Live Births               Home Medications    Prior to Admission medications   Medication Sig Start Date End Date Taking? Authorizing Provider  CALCIUM-VITAMIN D PO Take 1 tablet by mouth as needed.     [provider]  Cholecalciferol (VITAMIN D3) 2000 units TABS Take 2,000 Units by mouth daily with lunch.    [provider]    darunavir-cobicistat (PREZCOBIX) 800-150 MG tablet TAKE 1 TABLET BY MOUTH DAILY. SWALLOW WHOLE. DO NOT CRUSH, BREAK OR CHEW TABLETS. TAKE WITH FOOD. 01/16/18   Golden Circle, FNP  Dolutegravir-Rilpivirine (JULUCA) 50-25 MG TABS Take 1 tablet by mouth daily with breakfast. Take with the Prezcobix. 01/16/18   Golden Circle, FNP  HYDROcodone-acetaminophen (NORCO/VICODIN) 5-325 MG tablet TAKE 1 TABLET BY MOUTH EVERY 6 HOURS AS NEEDED FOR MODERATE PAIN 02/26/18   Gildardo Cranker, DO  levothyroxine (SYNTHROID, LEVOTHROID) 150 MCG tablet Take 1 tablet (150 mcg total) by mouth daily before breakfast. 03/07/18   Renato Shin, MD  loratadine (CLARITIN) 10 MG tablet Take 10 mg by mouth as needed for itching. Reported on 06/08/2015    [provider]  maraviroc (SELZENTRY) 150 MG tablet Take 1 tablet (150 mg total) by mouth 2 (two) times  daily. 07/04/17   Tommy Medal, Lavell Islam, MD  MELOXICAM PO Take by mouth.    [provider]  Multiple Vitamins-Minerals (HAIR/SKIN/NAILS) TABS Take 1 tablet by mouth daily.    [provider]  olmesartan-hydrochlorothiazide (BENICAR HCT) 40-25 MG tablet TAKE 1 TABLET BY MOUTH DAILY. 02/27/18   Magrinat, Virgie Dad, MD  Omega-3 Fatty Acids (FISH OIL PO) Take 1 capsule by mouth daily with lunch.    [provider]  Pitavastatin Calcium 4 MG TABS Take 1 tablet (4 mg total) by mouth daily. This may be placebo, study provided. Do not dispense. 12/07/15   Truman [place], MD  Probiotic Product (PROBIOTIC PO) Take 1 capsule by mouth daily with lunch.    [provider]    Family History Family History  Problem Relation Age of Onset  . Heart attack Brother        Massive MI in 6s  . Stroke Brother        CAD  . Kidney disease Mother   . Stroke Mother   . Diabetes Mother   . Liver disease Sister   . COPD Sister        had I-131 rx of hyperthyroidism  . Diabetes Sister   . Stroke Sister   . Lung cancer Paternal Uncle        hx  smoking  . Cancer Cousin   . Non-Hodgkin's lymphoma Cousin 25       cancer x3, in prostate and lung- unsure if met/spread or if primaries  . Heart failure Father   . Heart disease Father   . Arthritis Father   . Sarcoidosis Sister     Social History Social History   Tobacco Use  . Smoking status: Never Smoker  . Smokeless tobacco: Never Used  Substance Use Topics  . Alcohol use: No  . Drug use: No     Allergies   Lisinopril; Pepcid [famotidine]; Prilosec [omeprazole]; and Tums [calcium carbonate antacid]   Review of Systems Review of Systems   Physical Exam Triage Vital Signs ED Triage Vitals  Enc Vitals Group     BP 04/14/18 0847 123/88     Pulse Rate 04/14/18 0847 78     Resp 04/14/18 0847 20     Temp 04/14/18 0847 (!) 97.5 F (36.4 C)     Temp Source 04/14/18 0847 Oral     SpO2 04/14/18 0847 94 %     Weight --      Height --      Head Circumference --      Peak Flow --      Pain Score 04/14/18 0848 0     Pain Loc --      Pain Edu? --      Excl. in [place]? --    No data found.  Updated Vital Signs BP 123/88 (BP Location: Left Arm)   Pulse 78   Temp (!) 97.5 F (36.4 C) (Oral)   Resp 20   LMP 11/06/2003   SpO2 94%    Physical Exam  Constitutional: She is oriented to person, place, and time. She appears well-developed and well-nourished. No distress.  HENT:  Head: Normocephalic and atraumatic.  Right Ear: Tympanic membrane, external ear and ear canal normal.  Left Ear: Tympanic membrane, external ear and ear canal normal.  Nose: Nose normal.  Mouth/Throat: Uvula is midline, oropharynx is clear and moist and mucous membranes are normal. No tonsillar exudate.  Eyes: Pupils are equal, round,  and reactive to light. Conjunctivae and EOM are normal.  Cardiovascular: Normal rate, regular rhythm and normal heart sounds.  Pulmonary/Chest: Effort normal. No accessory muscle usage. No tachypnea. No respiratory distress. She has decreased breath sounds in the  right middle field and the right lower field. She has no wheezes. She has no rales.  Lymphadenopathy:  No peripheral edema noted   Neurological: She is alert and oriented to person, place, and time.  Skin: Skin is warm and dry.     UC Treatments / Results  Labs (all labs ordered are listed, but only abnormal results are displayed) Labs Reviewed - No data to display  EKG None  Radiology Dg Chest 2 View  Result Date: 04/14/2018 CLINICAL DATA:  Shortness of breath EXAM: CHEST - 2 VIEW COMPARISON:  05/17/2017 FINDINGS: Cardiac shadow is stable in appearance. New right-sided pleural effusion with likely underlying infiltrate is present on the right. The left lung is clear. No bony abnormality is seen. IMPRESSION: Right-sided pleural effusion with likely underlying infiltrate/atelectasis on the right. 50% opacification of the right hemithorax is noted. Electronically Signed   By: Inez Catalina M.D.   On: 04/14/2018 09:12    Procedures Procedures (including critical care time)  Medications Ordered in UC Medications - No data to display  Initial Impression / Assessment and Plan / UC Course  I have reviewed the triage vital signs and the nursing notes.  Pertinent labs & imaging results that were available during my care of the patient were reviewed by me and considered in my medical decision making (see chart for details).    O2 at 94%, no obvious tachypnea, or increased work of breathing. Afebrile. No cough. No obvious infection indications. Significant findings on chest xray and patient subjectively symptomatic, decreased  Lungs to right base. Hx of breast cancer, HIV. No obvious indication of source of findings. Recommend further evaluation in ER at this time. Stable for self transport. Patient verbalized understanding and agreeable to plan.    Final Clinical Impressions(s) / UC Diagnoses   Final diagnoses:  Pleural effusion  Shortness of breath     Discharge Instructions       I am worried about your chest xray and would like you to get further evaluation and treatment in the Er.     ED Prescriptions    None     Controlled Substance Prescriptions [place] Controlled Substance Registry consulted? Not Applicable   Zigmund Gottron, NP 04/14/18 [phone]

## 2018-04-14 NOTE — Discharge Instructions (Addendum)
You will need to follow-up with the results of your lab work when it is available.  Schedule an appointment with your primary care provider for follow-up. Return to ED for worsening symptoms, trouble breathing or trouble swallowing, chest pain, vomiting or coughing up blood, lightheadedness or loss of consciousness.

## 2018-04-14 NOTE — Discharge Instructions (Signed)
I am worried about your chest xray and would like you to get further evaluation and treatment in the Er.

## 2018-04-14 NOTE — Procedures (Signed)
Ultrasound-guided diagnostic and therapeutic right thoracentesis performed yielding 2.5 liters of yellow fluid. No immediate complications. Follow-up chest x-ray pending. A portion of the fluid was sent to the lab for preordered studies.

## 2018-04-14 NOTE — ED Triage Notes (Signed)
C/o feeling like she can't get a deep enough breath in onset 1 week ago.

## 2018-04-18 ENCOUNTER — Ambulatory Visit (INDEPENDENT_AMBULATORY_CARE_PROVIDER_SITE_OTHER): Payer: Medicare HMO | Admitting: Nurse Practitioner

## 2018-04-18 ENCOUNTER — Encounter: Payer: Self-pay | Admitting: Nurse Practitioner

## 2018-04-18 ENCOUNTER — Other Ambulatory Visit: Payer: Self-pay | Admitting: *Deleted

## 2018-04-18 VITALS — BP 110/80 | HR 70 | Temp 98.0°F | Resp 20 | Ht 68.0 in | Wt 275.8 lb

## 2018-04-18 DIAGNOSIS — B20 Human immunodeficiency virus [HIV] disease: Secondary | ICD-10-CM

## 2018-04-18 DIAGNOSIS — C50911 Malignant neoplasm of unspecified site of right female breast: Secondary | ICD-10-CM

## 2018-04-18 DIAGNOSIS — Z23 Encounter for immunization: Secondary | ICD-10-CM

## 2018-04-18 DIAGNOSIS — J90 Pleural effusion, not elsewhere classified: Secondary | ICD-10-CM | POA: Diagnosis not present

## 2018-04-18 DIAGNOSIS — M1990 Unspecified osteoarthritis, unspecified site: Secondary | ICD-10-CM | POA: Diagnosis not present

## 2018-04-18 DIAGNOSIS — E890 Postprocedural hypothyroidism: Secondary | ICD-10-CM | POA: Diagnosis not present

## 2018-04-18 DIAGNOSIS — I10 Essential (primary) hypertension: Secondary | ICD-10-CM | POA: Diagnosis not present

## 2018-04-18 DIAGNOSIS — N183 Chronic kidney disease, stage 3 unspecified: Secondary | ICD-10-CM

## 2018-04-18 DIAGNOSIS — R739 Hyperglycemia, unspecified: Secondary | ICD-10-CM

## 2018-04-18 DIAGNOSIS — E782 Mixed hyperlipidemia: Secondary | ICD-10-CM | POA: Diagnosis not present

## 2018-04-18 MED ORDER — TETANUS-DIPHTH-ACELL PERTUSSIS 5-2.5-18.5 LF-MCG/0.5 IM SUSP: 0.5000 mL | Freq: Once | INTRAMUSCULAR | 0 refills | Status: DC

## 2018-04-18 MED ORDER — TETANUS-DIPHTH-ACELL PERTUSSIS 5-2.5-18.5 LF-MCG/0.5 IM SUSP: 0.5000 mL | Freq: Once | INTRAMUSCULAR | 0 refills | Status: AC

## 2018-04-18 NOTE — Telephone Encounter (Signed)
Printed Off for Kayla Price for patient in office.

## 2018-04-18 NOTE — Progress Notes (Signed)
Careteam: Patient Care Team: Lauree Chandler, NP as PCP - General (Nurse Practitioner) Tommy Medal, Lavell Islam, MD as PCP - Infectious Diseases (Infectious Diseases) Clent Jacks, MD as Consulting Physician (Ophthalmology) Renato Shin, MD as Consulting Physician (Endocrinology) Frederik Pear, MD as Consulting Physician (Orthopedic Surgery) Magrinat, Virgie Dad, MD as Consulting Physician (Oncology) Fanny Skates, MD as Consulting Physician (General Surgery) Donnamae Jude, MD as Consulting Physician (Obstetrics and Gynecology) Delice Bison Charlestine Massed, NP as Nurse Practitioner (Hematology and Oncology)  Advanced Directive information    Allergies  Allergen Reactions  . Lisinopril Anaphylaxis and Swelling    Swelling of tongue and mouth 11/05/16- tolerates Olmesartan  . Pepcid [Famotidine] Other (See Comments)    PPI H2, BLOCKERS LOWER GASTRIC PH WHICH WOULD LEAD TO SUBTHERAPEUTIC RILPIVIRINE LEVELS AND POTENTIAL VIROLOGICAL FAILURE WITH RESISTANCE  . Prilosec [Omeprazole] Other (See Comments)    PPI H2, BLOCKERS LOWER GASTRIC PH WHICH WOULD LEAD TO SUBTHERAPEUTIC RILPIVIRINE LEVELS AND POTENTIAL VIROLOGICAL FAILURE WITH RESISTANCE  . Tums [Calcium Carbonate Antacid] Other (See Comments)    TUMS ANTACIDS CAN LOWER GASTRIC PH WHICH COULD  LEAD TO SUBTHERAPEUTIC RILPIVIRINE LEVELS AND POTENTIAL VIROLOGICAL FAILURE WITH RESISTANCE TUMS CAN BE GIVEN BUT NEED CONSULT WITH ID PHARMACY RE TIMING. I PREFER HER TO AVOID ALL TOGETHER    Chief Complaint  Patient presents with  . Medical Management of Chronic Issues    6 mo f/u- hypothyroidism, hyperlipedemia     HPI: Patient is a 62 y.o. female seen in the office today for routine follow up.  She recently went to the ED due to unable to take a deep breath and pain when she laid down, found to have a large plural effusion- 2.5L removed in the ED. She did have shortness of breath but no  chest pain, congestion.   Pt has pathology  report which was negative for malignancy, showing reactive mesothelial cells  Stop taking meloxicam- pain is unchanged since starting. Reports she is getting steroid shots in her left thumb due to trigger finger. Using a brace which has helped. Using brace on and off which has helped. Avoiding surgery.   Seeing Dr Loanne Drilling for post-RAI hypothyroidism, graves dx in 2009. Recently with decrease in levothyroxine to 155mg from 175 mcg  Following with Dr VTommy Medalfor HIV twice yearly. Also on study drug for cholesterol, no sure if she is on placebo or drug   Htn- stable on olmesartan-hctz   Review of Systems:  Review of Systems  Constitutional: Negative for chills, fever and weight loss.  HENT: Negative for tinnitus.   Respiratory: Negative for cough, sputum production and shortness of breath.   Cardiovascular: Negative for chest pain, palpitations and leg swelling.  Gastrointestinal: Negative for abdominal pain, constipation, diarrhea and heartburn.  Genitourinary: Negative for dysuria, frequency and urgency.  Musculoskeletal: Positive for joint pain (occasionally). Negative for back pain, falls and myalgias.  Skin: Negative.   Neurological: Negative for dizziness and headaches.  Endo/Heme/Allergies: Positive for environmental allergies.  Psychiatric/Behavioral: Negative for depression and memory loss. The patient does not have insomnia.     Past Medical History:  Diagnosis Date  . Alopecia areata 11/28/2009  . Bell's palsy   . Cancer ([facility] 2005   Breast cancer   chemotherapy and radiation  . CKD (chronic kidney disease) stage 3, GFR 30-59 ml/min (HCC) 06/08/2015  . Dry eye syndrome   . Family history of lung cancer   . Family history of non-Hodgkin's lymphoma   .  Fasting hyperglycemia   . HIP FRACTURE, RIGHT 05/06/2008  . History of kidney stones   . HIV DISEASE 03/27/2006  . HYPERLIPIDEMIA, MIXED 12/15/2007  . HYPERTENSION 03/27/2006  . 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  MENORRHAGIA, POSTMENOPAUSAL 02/03/2009  . Osteoarthritis of left knee 06/08/2015  . OSTEOARTHROSIS, LOCAL, SCND, UNSPC SITE 04/07/2007  . PVD 04/07/2007  . Tinea capitis   . TRIGGER FINGER 05/06/2008  . Unspecified vitamin D deficiency 08/06/2007   Past Surgical History:  Procedure Laterality Date  . BREAST LUMPECTOMY Right    2005  . COLONOSCOPY    . COLONOSCOPY WITH ESOPHAGOGASTRODUODENOSCOPY (EGD)    . HYSTEROSCOPY  2006  . IR THORACENTESIS ASP PLEURAL SPACE W/IMG GUIDE  04/14/2018  . KNEE ARTHROSCOPY Left 2002  . LYMPH NODE DISSECTION  2005  . MASTECTOMY MODIFIED RADICAL Right 11/05/2016   Procedure: RIGHT MASTECTOMY MODIFIED RADICAL;  Surgeon: Fanny Skates, MD;  Location: [place];  Service: General;  Laterality: Right;  . MODIFIED RADICAL MASTECTOMY Right 11/05/2016  . placement   of port-a-cath    . PORT-A-CATH REMOVAL  2006   insertion 2005  . PORT-A-CATH REMOVAL N/A 03/29/2017   Procedure: REMOVAL PORT-A-CATH;  Surgeon: Fanny Skates, MD;  Location: [place];  Service: General;  Laterality: N/A;  . PORTACATH PLACEMENT Right 11/05/2016   Procedure: INSERTION PORT-A-CATH WITH ULTRA SOUND;  Surgeon: Fanny Skates, MD;  Location: [place];  Service: General;  Laterality: Right;  . removal of port a cath  12/2014  . THYROID SURGERY  2009   Ablation   . TOTAL KNEE ARTHROPLASTY Left 02/06/2016   Procedure: TOTAL KNEE ARTHROPLASTY;  Surgeon: Frederik Pear, MD;  Location: [place];  Service: Orthopedics;  Laterality: Left;  . TUBAL LIGATION     Social History:   reports that she has never smoked. She has never used smokeless tobacco. She reports that she does not drink alcohol or use drugs.  Family History  Problem Relation Age of Onset  . Heart attack Brother        Massive MI in 62s  . Stroke Brother        CAD  . Kidney disease Mother   . Stroke Mother   . Diabetes Mother   . Liver disease Sister   . COPD Sister        had I-131 rx of hyperthyroidism  . Diabetes Sister   .  Stroke Sister   . Lung cancer Paternal Uncle        hx smoking  . Cancer Cousin   . Non-Hodgkin's lymphoma Cousin 25       cancer x3, in prostate and lung- unsure if met/spread or if primaries  . Heart failure Father   . Heart disease Father   . Arthritis Father   . Sarcoidosis Sister     Medications: Patient's Medications  New Prescriptions   No medications on file  Previous Medications   CALCIUM-VITAMIN D PO    Take 1 tablet by mouth as needed.    CHOLECALCIFEROL (VITAMIN D3) 2000 UNITS TABS    Take 2,000 Units by mouth daily with lunch.   DARUNAVIR-COBICISTAT (PREZCOBIX) 800-150 MG TABLET    TAKE 1 TABLET BY MOUTH DAILY. SWALLOW WHOLE. DO NOT CRUSH, BREAK OR CHEW TABLETS. TAKE WITH FOOD.   DOLUTEGRAVIR-RILPIVIRINE (JULUCA) 50-25 MG TABS    Take 1 tablet by mouth daily with breakfast. Take with the Prezcobix.   HYDROCODONE-ACETAMINOPHEN (NORCO/VICODIN) 5-325 MG TABLET    TAKE 1 TABLET BY MOUTH EVERY  6 HOURS AS NEEDED FOR MODERATE PAIN   LEVOTHYROXINE (SYNTHROID, LEVOTHROID) 150 MCG TABLET    Take 1 tablet (150 mcg total) by mouth daily before breakfast.   LORATADINE (CLARITIN) 10 MG TABLET    Take 10 mg by mouth as needed for itching. Reported on 06/08/2015   MARAVIROC (SELZENTRY) 150 MG TABLET    Take 1 tablet (150 mg total) by mouth 2 (two) times daily.   MULTIPLE VITAMINS-MINERALS (HAIR/SKIN/NAILS) TABS    Take 1 tablet by mouth daily.   OLMESARTAN-HYDROCHLOROTHIAZIDE (BENICAR HCT) 40-25 MG TABLET    TAKE 1 TABLET BY MOUTH DAILY.   OMEGA-3 FATTY ACIDS (FISH OIL PO)    Take 1 capsule by mouth daily with lunch.   PITAVASTATIN CALCIUM 4 MG TABS    Take 1 tablet (4 mg total) by mouth daily. This may be placebo, study provided. Do not dispense.   PROBIOTIC PRODUCT (PROBIOTIC PO)    Take 1 capsule by mouth daily with lunch.  Modified Medications   No medications on file  Discontinued Medications   MELOXICAM PO    Take by mouth.     Physical Exam:  Vitals:   04/18/18 0828  BP:  110/80  Pulse: 70  Resp: 20  Temp: 98 F (36.7 C)  TempSrc: Oral  SpO2: 95%  Weight: 275 lb 12.8 oz (125.1 kg)  Height: '5\' 8"'  (1.727 m)   Body mass index is 41.94 kg/m.  Physical Exam  Constitutional: She is oriented to person, place, and time. She appears well-developed and well-nourished. No distress.  HENT:  Head: Normocephalic and atraumatic.  Mouth/Throat: Oropharynx is clear and moist. No oropharyngeal exudate.  Eyes: Pupils are equal, round, and reactive to light. Conjunctivae are normal.  Neck: Normal range of motion. Neck supple.  Cardiovascular: Normal rate, regular rhythm and normal heart sounds.  Pulmonary/Chest: Effort normal. She has decreased breath sounds in the right lower field.  Abdominal: Soft. Bowel sounds are normal.  Musculoskeletal: She exhibits no edema or tenderness.  Wears a brace to left thumb  Neurological: She is alert and oriented to person, place, and time.  Skin: Skin is warm and dry. She is not diaphoretic.  Psychiatric: She has a normal mood and affect.    Labs reviewed: Basic Metabolic Panel: Recent Labs    10/15/17 1007 12/25/17 1058 03/07/18 0819 04/14/18 0958  NA 144 145  --  139  K 4.3 4.4  --  4.3  CL 104 105  --  101  CO2 34* 30  --  28  GLUCOSE 95 100*  --  100*  BUN 21 17  --  16  CREATININE 1.47* 1.40*  --  1.64*  CALCIUM 10.3 10.8*  --  10.5*  TSH  --   --  0.30*  --    Liver Function Tests: Recent Labs    05/13/17 0927 10/15/17 1007 12/25/17 1058  AST '14 13 14  ' ALT '12 10 11  ' ALKPHOS 90  --   --   BILITOT 0.27 0.4 0.6  PROT 8.1 7.2 7.6  ALBUMIN 3.5  --   --    No results for input(s): LIPASE, AMYLASE in the last 8760 hours. No results for input(s): AMMONIA in the last 8760 hours. CBC: Recent Labs    05/13/17 0924 10/15/17 1007 12/25/17 1058 04/14/18 0958  WBC 3.3* 4.6 4.2 5.3  NEUTROABS 1.7 1,872 1,634  --   HGB 13.1 14.0 15.0 16.4*  HCT 40.3 40.4 43.6 51.4*  MCV 100.6  95.3 95.2 99.4  PLT 158 172  177 228   Lipid Panel: Recent Labs    12/25/17 1058  CHOL 199  HDL 60  LDLCALC 116*  TRIG 115  CHOLHDL 3.3   TSH: Recent Labs    03/07/18 0819  TSH 0.30*   A1C: Lab Results  Component Value Date   HGBA1C 5.2 05/28/2016   Dg Chest 1 View  Result Date: 04/14/2018 CLINICAL DATA:  Status post right thoracentesis. EXAM: CHEST  1 VIEW COMPARISON:  Chest x-ray from same day at 10:19 a.m. FINDINGS: The heart size and mediastinal contours are within normal limits. Normal pulmonary vascularity. Interval decrease in size of now small right pleural effusion with improved aeration of the right lower lobe. The left lung is clear. No pneumothorax. No acute osseous abnormality. IMPRESSION: Decreased now small right pleural effusion.  No pneumothorax. Electronically Signed   By: Titus Dubin M.D.   On: 04/14/2018 16:28   Dg Chest 2 View  Result Date: 04/14/2018 CLINICAL DATA:  Follow-up pleural effusion. EXAM: CHEST - 2 VIEW COMPARISON:  Earlier film, same date. FINDINGS: Persistent moderate-sized right pleural effusion approximately halfway up the thorax. No pneumothorax. The left lung is clear. Heart is normal in size and stable. Moderate atelectasis overlying the effusion. IMPRESSION: Persistent moderate-sized right pleural effusion. Electronically Signed   By: Marijo Sanes M.D.   On: 04/14/2018 10:36   Dg Chest 2 View  Result Date: 04/14/2018 CLINICAL DATA:  Shortness of breath EXAM: CHEST - 2 VIEW COMPARISON:  05/17/2017 FINDINGS: Cardiac shadow is stable in appearance. New right-sided pleural effusion with likely underlying infiltrate is present on the right. The left lung is clear. No bony abnormality is seen. IMPRESSION: Right-sided pleural effusion with likely underlying infiltrate/atelectasis on the right. 50% opacification of the right hemithorax is noted. Electronically Signed   By: Inez Catalina M.D.   On: 04/14/2018 09:12   Ir Thoracentesis Asp Pleural Space W/img  Guide  Result Date: 04/14/2018 INDICATION: Patient with history of chronic kidney disease, breast cancer, dyspnea, right pleural effusion. Request made for diagnostic and therapeutic right thoracentesis. EXAM: ULTRASOUND GUIDED DIAGNOSTIC AND THERAPEUTIC RIGHT THORACENTESIS MEDICATIONS: None COMPLICATIONS: None immediate. PROCEDURE: An ultrasound guided thoracentesis was thoroughly discussed with the patient and questions answered. The benefits, risks, alternatives and complications were also discussed. The patient understands and wishes to proceed with the procedure. Written consent was obtained. Ultrasound was performed to localize and mark an adequate pocket of fluid in the right chest. The area was then prepped and draped in the normal sterile fashion. 1% Lidocaine was used for local anesthesia. Under ultrasound guidance a 6 Fr Safe-T-Centesis catheter was introduced. Thoracentesis was performed. The catheter was removed and a dressing applied. FINDINGS: A total of approximately 2.5 liters of yellow fluid was removed. Samples were sent to the laboratory as requested by the clinical team. IMPRESSION: Successful ultrasound guided diagnostic and therapeutic right thoracentesis yielding 2.5 liters of pleural fluid. Read by: Rowe Robert, PA-C Electronically Signed   By: Markus Daft M.D.   On: 04/14/2018 17:04    Assessment/Plan 1. Pleural effusion, not elsewhere classified -WBC 1750 without growth on culture, negative for malignancy.  - CT Chest Wo Contrast; Future for further evaluation. - CBC with Differential/Platelets  2. Hyperglycemia - Hemoglobin A1c  3. CKD (chronic kidney disease) stage 3, GFR 30-59 ml/min (HCC) -Encourage proper hydration and to avoid NSAIDS (Aleve, Advil, Motrin, Ibuprofen)  - BMP with eGFR([facility])  4. HYPERLIPIDEMIA, MIXED -currently on study  medication, LDL slightly elevated with last lab.   5. Osteoarthritis, unspecified osteoarthritis type, unspecified site Stable,  recently stopped meloxicam. Discussed avoiding NSAIDS with CKD  6. Postablative hypothyroidism -following with endocrinology, recent decrease in levothyroxine to 150 mg.   7. Essential hypertension Stable on current regimen - BMP with eGFR([facility])  8. Need for immunization against influenza - Flu Vaccine QUAD 36+ mos IM  9. HIV Continues to follow up with ID every 6 months.  10. Malignant neoplasm of upper-outer quadrant of right breast in female, estrogen receptor negative  status post radical mastectomy 11/05/2016 showing a pT2 pN1, stage IIIB invasive ductal carcinoma, grade 3, triple negative, with negative margins following with oncologist, no signs of progressive disease on latest scans.   Next appt: 6 month, sooner if needed  Kayla Price, [facility] [phone]

## 2018-04-18 NOTE — Patient Instructions (Signed)
We will get CT of chest to further evaluate fluid in lungs

## 2018-04-19 LAB — CULTURE, BODY FLUID W GRAM STAIN -BOTTLE: Culture: NO GROWTH

## 2018-04-19 LAB — CBC WITH DIFFERENTIAL/PLATELET
BASOS ABS: 29 {cells}/uL (ref 0–200)
Basophils Relative: 0.7 %
Eosinophils Absolute: 119 cells/uL (ref 15–500)
Eosinophils Relative: 2.9 %
HCT: 44.8 % (ref 35.0–45.0)
Hemoglobin: 15 g/dL (ref 11.7–15.5)
Lymphs Abs: 1300 cells/uL (ref 850–3900)
MCH: 32.8 pg (ref 27.0–33.0)
MCHC: 33.5 g/dL (ref 32.0–36.0)
MCV: 97.8 fL (ref 80.0–100.0)
MONOS PCT: 10.1 %
MPV: 9.8 fL (ref 7.5–12.5)
Neutro Abs: 2239 cells/uL (ref 1500–7800)
Neutrophils Relative %: 54.6 %
PLATELETS: 216 10*3/uL (ref 140–400)
RBC: 4.58 10*6/uL (ref 3.80–5.10)
RDW: 12.4 % (ref 11.0–15.0)
TOTAL LYMPHOCYTE: 31.7 %
WBC mixed population: 414 cells/uL (ref 200–950)
WBC: 4.1 10*3/uL (ref 3.8–10.8)

## 2018-04-19 LAB — BASIC METABOLIC PANEL WITH GFR
BUN/Creatinine Ratio: 9 (calc) (ref 6–22)
BUN: 14 mg/dL (ref 7–25)
CO2: 33 mmol/L — ABNORMAL HIGH (ref 20–32)
CREATININE: 1.48 mg/dL — AB (ref 0.50–0.99)
Calcium: 9.9 mg/dL (ref 8.6–10.4)
Chloride: 103 mmol/L (ref 98–110)
GFR, EST AFRICAN AMERICAN: 44 mL/min/{1.73_m2} — AB (ref >=60)
GFR, Est Non African American: 38 mL/min/{1.73_m2} — ABNORMAL LOW (ref >=60)
GLUCOSE: 87 mg/dL (ref 65–99)
Potassium: 3.8 mmol/L (ref 3.5–5.3)
SODIUM: 142 mmol/L (ref 135–146)

## 2018-04-19 LAB — HEMOGLOBIN A1C
EAG (MMOL/L): 5.8 (calc)
Hgb A1c MFr Bld: 5.3 % of total Hgb (ref <5.7)
Mean Plasma Glucose: 105 (calc)

## 2018-04-22 ENCOUNTER — Ambulatory Visit
Admission: RE | Admit: 2018-04-22 | Discharge: 2018-04-22 | Disposition: A | Discharge status: Home / Self Care | Payer: Medicare HMO | Source: Ambulatory Visit | Attending: Nurse Practitioner | Admitting: Nurse Practitioner

## 2018-04-22 DIAGNOSIS — J90 Pleural effusion, not elsewhere classified: Secondary | ICD-10-CM | POA: Diagnosis not present

## 2018-04-23 ENCOUNTER — Telehealth: Payer: Self-pay | Admitting: Endocrinology

## 2018-04-23 ENCOUNTER — Other Ambulatory Visit: Payer: Self-pay | Admitting: Nurse Practitioner

## 2018-04-23 DIAGNOSIS — J90 Pleural effusion, not elsewhere classified: Secondary | ICD-10-CM

## 2018-04-23 DIAGNOSIS — E890 Postprocedural hypothyroidism: Secondary | ICD-10-CM

## 2018-04-23 NOTE — Telephone Encounter (Signed)
FYI - Please refer to pt message below.

## 2018-04-23 NOTE — Telephone Encounter (Signed)
Yes, you are right, but this is only if the thyroid is severely low.  I would be happy to recheck if you want.

## 2018-04-23 NOTE — Telephone Encounter (Signed)
Patient wanted Dr Loanne Drilling to be aware of her up coming visit with Pulmonary.  She has fluid under her lung that they have drained off but it is slowly coming back.  She stated that she has been researching this and believes her having hypothyroidism plays a part in this. She said she has not been seen by Dr Loanne Drilling in a month and felt he should know what is going on  Patient requested a call back.

## 2018-04-23 NOTE — Telephone Encounter (Signed)
error 

## 2018-04-23 NOTE — Telephone Encounter (Signed)
Have pt scheduled for tomorrow morning for repeat labs

## 2018-04-23 NOTE — Telephone Encounter (Signed)
Ok, I have placed the order

## 2018-04-24 ENCOUNTER — Other Ambulatory Visit (INDEPENDENT_AMBULATORY_CARE_PROVIDER_SITE_OTHER): Payer: Medicare HMO

## 2018-04-24 DIAGNOSIS — E890 Postprocedural hypothyroidism: Secondary | ICD-10-CM

## 2018-04-24 LAB — T4, FREE: Free T4: 1.05 ng/dL (ref 0.60–1.60)

## 2018-04-24 LAB — TSH: TSH: 9.72 u[IU]/mL — AB (ref 0.35–4.50)

## 2018-04-25 ENCOUNTER — Encounter: Payer: Self-pay | Admitting: Emergency Medicine

## 2018-04-25 ENCOUNTER — Ambulatory Visit (INDEPENDENT_AMBULATORY_CARE_PROVIDER_SITE_OTHER): Payer: Medicare HMO | Admitting: Emergency Medicine

## 2018-04-25 VITALS — BP 114/64 | HR 81 | Ht 67.0 in | Wt 276.8 lb

## 2018-04-25 DIAGNOSIS — Z853 Personal history of malignant neoplasm of breast: Secondary | ICD-10-CM | POA: Diagnosis not present

## 2018-04-25 DIAGNOSIS — J90 Pleural effusion, not elsewhere classified: Secondary | ICD-10-CM | POA: Insufficient documentation

## 2018-04-25 NOTE — Patient Instructions (Signed)
We will arrange for a PET scan to better evaluate her pleural space. Once the PET scan is scheduled I would like to arrange for repeat thoracentesis through interventional radiology, a few days prior to the PET scan itself. We will coordinate these results with Dr Griffith Citron Follow with Dr Lamonte Sakai next available after your studies are completed.

## 2018-04-25 NOTE — Assessment & Plan Note (Signed)
Pleural effusion of unclear etiology, has exudative characteristics by light's criteria, cytology negative.  I do not see any medications that would cause this.  She denies any trauma.  She does have hypothyroidism and this is a potential cause.  I am concerned that her CT scan 10/29 shows an area of anterior pleural thickening.  Certainly this could have been in the radiation port from her XRT from 2005, but I do not see the this area of thickening on her CT scan from 05/17/2017.  Certainly we need to rule out malignancy.  I think a PET scan is appropriate.  I like to have her pleural space drained dry prior to that study to optimize visualization.  I will communicate with Dr. Jana Hakim as we collect this data.  We will arrange for a PET scan to better evaluate her pleural space. Once the PET scan is scheduled I would like to arrange for repeat thoracentesis through interventional radiology, a few days prior to the PET scan itself. We will coordinate these results with Dr Griffith Citron Follow with Dr Lamonte Sakai next available after your studies are completed.

## 2018-04-25 NOTE — Progress Notes (Signed)
Subjective:    Patient ID: Kayla Price, female    DOB: 1956-05-29, 62 y.o.   MRN: [id number]  HPI 62 year old never smoker with a history of breast cancer (lumpectomy and XRT 2005, mastectomy and adjuvant chemo 2018), HIV on rx, hypertension, mild renal insufficiency, hypothyroidism, obesity.  She has been evaluated in the emergency department in late October for dyspnea, orthopnea and inability to take a deep breath.  She was found to have a right pleural effusion and underwent thoracentesis on 04/14/2018, 2.5 L of yellow fluid, cytology with reactive mesothelial cells but no evidence for malignancy.  A CT scan of the chest 04/22/2018 was reviewed by me, shows some reaccumulation of right pleural fluid, an area of focal pleural thickening versus loculated fluid anteriorly.  I do not see any evidence for this pleural thickening on her most recent prior CT chest 05/17/2017.  She was otherwise completely asymptomatic - no pleuritic pain, no fevers.   Pleural LDH 423, total protein 5.5, WBC 1750 (63% lymphs, 37% monocyte), consistent with an exudate.  Culture negative.   ? Her hypothyroidism.     Review of Systems  Constitutional: Negative for fever and unexpected weight change.  HENT: Negative for congestion, dental problem, ear pain, nosebleeds, postnasal drip, rhinorrhea, sinus pressure, sneezing, sore throat and trouble swallowing.   Eyes: Negative for redness and itching.  Respiratory: Negative for cough, chest tightness, shortness of breath and wheezing.   Cardiovascular: Negative for palpitations and leg swelling.  Gastrointestinal: Negative for nausea and vomiting.  Genitourinary: Negative for dysuria.  Musculoskeletal: Negative for joint swelling.  Skin: Negative for rash.  Allergic/Immunologic: Positive for immunocompromised state. Negative for environmental allergies and food allergies.  Neurological: Negative for headaches.  Hematological: Does not bruise/bleed easily.    Psychiatric/Behavioral: Negative for dysphoric mood. The patient is not nervous/anxious.     Past Medical History:  Diagnosis Date  . Alopecia areata 11/28/2009  . Bell's palsy   . Cancer [facility]) 2005   Breast cancer   chemotherapy and radiation  . CKD (chronic kidney disease) stage 3, GFR 30-59 ml/min (HCC) 06/08/2015  . Dry eye syndrome   . Family history of lung cancer   . Family history of non-Hodgkin's lymphoma   . Fasting hyperglycemia   . HIP FRACTURE, RIGHT 05/06/2008  . History of kidney stones   . HIV DISEASE 03/27/2006  . HYPERLIPIDEMIA, MIXED 12/15/2007  . HYPERTENSION 03/27/2006  . 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 MENORRHAGIA, POSTMENOPAUSAL 02/03/2009  . Osteoarthritis of left knee 06/08/2015  . OSTEOARTHROSIS, LOCAL, SCND, UNSPC SITE 04/07/2007  . PVD 04/07/2007  . Tinea capitis   . TRIGGER FINGER 05/06/2008  . Unspecified vitamin D deficiency 08/06/2007     Family History  Problem Relation Age of Onset  . Heart attack Brother        Massive MI in 49s  . Stroke Brother        CAD  . Kidney disease Mother   . Stroke Mother   . Diabetes Mother   . Liver disease Sister   . COPD Sister        had I-131 rx of hyperthyroidism  . Diabetes Sister   . Stroke Sister   . Lung cancer Paternal Uncle        hx smoking  . Cancer Cousin   . Non-Hodgkin's lymphoma Cousin 25       cancer x3, in prostate and lung- unsure if met/spread or if primaries  . Heart failure Father   .   Subjective:    Patient ID: Kayla Price, female    DOB: 06/21/1956, 62 y.o.   MRN: [id number]  HPI 62-year-old never smoker with a history of breast cancer (lumpectomy and XRT 2005, mastectomy and adjuvant chemo 2018), HIV on rx, hypertension, mild renal insufficiency, hypothyroidism, obesity.  She has been evaluated in the emergency department in late October for dyspnea, orthopnea and inability to take a deep breath.  She was found to have a right pleural effusion and underwent thoracentesis on 04/14/2018, 2.5 L of yellow fluid, cytology with reactive mesothelial cells but no evidence for malignancy.  A CT scan of the chest 04/22/2018 was reviewed by me, shows some reaccumulation of right pleural fluid, an area of focal pleural thickening versus loculated fluid anteriorly.  I do not see any evidence for this pleural thickening on her most recent prior CT chest 05/17/2017.  She was otherwise completely asymptomatic - no pleuritic pain, no fevers.   Pleural LDH 423, total protein 5.5, WBC 1750 (63% lymphs, 37% monocyte), consistent with an exudate.  Culture negative.   ? Her hypothyroidism.     Review of Systems  Constitutional: Negative for fever and unexpected weight change.  HENT: Negative for congestion, dental problem, ear pain, nosebleeds, postnasal drip, rhinorrhea, sinus pressure, sneezing, sore throat and trouble swallowing.   Eyes: Negative for redness and itching.  Respiratory: Negative for cough, chest tightness, shortness of breath and wheezing.   Cardiovascular: Negative for palpitations and leg swelling.  Gastrointestinal: Negative for nausea and vomiting.  Genitourinary: Negative for dysuria.  Musculoskeletal: Negative for joint swelling.  Skin: Negative for rash.  Allergic/Immunologic: Positive for immunocompromised state. Negative for environmental allergies and food allergies.  Neurological: Negative for headaches.  Hematological: Does not bruise/bleed easily.    Psychiatric/Behavioral: Negative for dysphoric mood. The patient is not nervous/anxious.     Past Medical History:  Diagnosis Date  . Alopecia areata 11/28/2009  . Bell's palsy   . Cancer (HCC) 2005   Breast cancer   chemotherapy and radiation  . CKD (chronic kidney disease) stage 3, GFR 30-59 ml/min (HCC) 06/08/2015  . Dry eye syndrome   . Family history of lung cancer   . Family history of non-Hodgkin's lymphoma   . Fasting hyperglycemia   . HIP FRACTURE, RIGHT 05/06/2008  . History of kidney stones   . HIV DISEASE 03/27/2006  . HYPERLIPIDEMIA, MIXED 12/15/2007  . HYPERTENSION 03/27/2006  . 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 MENORRHAGIA, POSTMENOPAUSAL 02/03/2009  . Osteoarthritis of left knee 06/08/2015  . OSTEOARTHROSIS, LOCAL, SCND, UNSPC SITE 04/07/2007  . PVD 04/07/2007  . Tinea capitis   . TRIGGER FINGER 05/06/2008  . Unspecified vitamin D deficiency 08/06/2007     Family History  Problem Relation Age of Onset  . Heart attack Brother        Massive MI in 40s  . Stroke Brother        CAD  . Kidney disease Mother   . Stroke Mother   . Diabetes Mother   . Liver disease Sister   . COPD Sister        had I-131 rx of hyperthyroidism  . Diabetes Sister   . Stroke Sister   . Lung cancer Paternal Uncle        hx smoking  . Cancer Cousin   . Non-Hodgkin's lymphoma Cousin 25       cancer x3, in prostate and lung- unsure if met/spread or if primaries  . Heart failure Father   .    Subjective:    Patient ID: Kayla Price, female    DOB: 06/21/1956, 62 y.o.   MRN: [id number]  HPI 62-year-old never smoker with a history of breast cancer (lumpectomy and XRT 2005, mastectomy and adjuvant chemo 2018), HIV on rx, hypertension, mild renal insufficiency, hypothyroidism, obesity.  She has been evaluated in the emergency department in late October for dyspnea, orthopnea and inability to take a deep breath.  She was found to have a right pleural effusion and underwent thoracentesis on 04/14/2018, 2.5 L of yellow fluid, cytology with reactive mesothelial cells but no evidence for malignancy.  A CT scan of the chest 04/22/2018 was reviewed by me, shows some reaccumulation of right pleural fluid, an area of focal pleural thickening versus loculated fluid anteriorly.  I do not see any evidence for this pleural thickening on her most recent prior CT chest 05/17/2017.  She was otherwise completely asymptomatic - no pleuritic pain, no fevers.   Pleural LDH 423, total protein 5.5, WBC 1750 (63% lymphs, 37% monocyte), consistent with an exudate.  Culture negative.   ? Her hypothyroidism.     Review of Systems  Constitutional: Negative for fever and unexpected weight change.  HENT: Negative for congestion, dental problem, ear pain, nosebleeds, postnasal drip, rhinorrhea, sinus pressure, sneezing, sore throat and trouble swallowing.   Eyes: Negative for redness and itching.  Respiratory: Negative for cough, chest tightness, shortness of breath and wheezing.   Cardiovascular: Negative for palpitations and leg swelling.  Gastrointestinal: Negative for nausea and vomiting.  Genitourinary: Negative for dysuria.  Musculoskeletal: Negative for joint swelling.  Skin: Negative for rash.  Allergic/Immunologic: Positive for immunocompromised state. Negative for environmental allergies and food allergies.  Neurological: Negative for headaches.  Hematological: Does not bruise/bleed easily.    Psychiatric/Behavioral: Negative for dysphoric mood. The patient is not nervous/anxious.     Past Medical History:  Diagnosis Date  . Alopecia areata 11/28/2009  . Bell's palsy   . Cancer (HCC) 2005   Breast cancer   chemotherapy and radiation  . CKD (chronic kidney disease) stage 3, GFR 30-59 ml/min (HCC) 06/08/2015  . Dry eye syndrome   . Family history of lung cancer   . Family history of non-Hodgkin's lymphoma   . Fasting hyperglycemia   . HIP FRACTURE, RIGHT 05/06/2008  . History of kidney stones   . HIV DISEASE 03/27/2006  . HYPERLIPIDEMIA, MIXED 12/15/2007  . HYPERTENSION 03/27/2006  . 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 MENORRHAGIA, POSTMENOPAUSAL 02/03/2009  . Osteoarthritis of left knee 06/08/2015  . OSTEOARTHROSIS, LOCAL, SCND, UNSPC SITE 04/07/2007  . PVD 04/07/2007  . Tinea capitis   . TRIGGER FINGER 05/06/2008  . Unspecified vitamin D deficiency 08/06/2007     Family History  Problem Relation Age of Onset  . Heart attack Brother        Massive MI in 40s  . Stroke Brother        CAD  . Kidney disease Mother   . Stroke Mother   . Diabetes Mother   . Liver disease Sister   . COPD Sister        had I-131 rx of hyperthyroidism  . Diabetes Sister   . Stroke Sister   . Lung cancer Paternal Uncle        hx smoking  . Cancer Cousin   . Non-Hodgkin's lymphoma Cousin 25       cancer x3, in prostate and lung- unsure if met/spread or if primaries  . Heart failure Father   .   Subjective:    Patient ID: Kayla Price, female    DOB: 1956-05-29, 62 y.o.   MRN: [id number]  HPI 62 year old never smoker with a history of breast cancer (lumpectomy and XRT 2005, mastectomy and adjuvant chemo 2018), HIV on rx, hypertension, mild renal insufficiency, hypothyroidism, obesity.  She has been evaluated in the emergency department in late October for dyspnea, orthopnea and inability to take a deep breath.  She was found to have a right pleural effusion and underwent thoracentesis on 04/14/2018, 2.5 L of yellow fluid, cytology with reactive mesothelial cells but no evidence for malignancy.  A CT scan of the chest 04/22/2018 was reviewed by me, shows some reaccumulation of right pleural fluid, an area of focal pleural thickening versus loculated fluid anteriorly.  I do not see any evidence for this pleural thickening on her most recent prior CT chest 05/17/2017.  She was otherwise completely asymptomatic - no pleuritic pain, no fevers.   Pleural LDH 423, total protein 5.5, WBC 1750 (63% lymphs, 37% monocyte), consistent with an exudate.  Culture negative.   ? Her hypothyroidism.     Review of Systems  Constitutional: Negative for fever and unexpected weight change.  HENT: Negative for congestion, dental problem, ear pain, nosebleeds, postnasal drip, rhinorrhea, sinus pressure, sneezing, sore throat and trouble swallowing.   Eyes: Negative for redness and itching.  Respiratory: Negative for cough, chest tightness, shortness of breath and wheezing.   Cardiovascular: Negative for palpitations and leg swelling.  Gastrointestinal: Negative for nausea and vomiting.  Genitourinary: Negative for dysuria.  Musculoskeletal: Negative for joint swelling.  Skin: Negative for rash.  Allergic/Immunologic: Positive for immunocompromised state. Negative for environmental allergies and food allergies.  Neurological: Negative for headaches.  Hematological: Does not bruise/bleed easily.    Psychiatric/Behavioral: Negative for dysphoric mood. The patient is not nervous/anxious.     Past Medical History:  Diagnosis Date  . Alopecia areata 11/28/2009  . Bell's palsy   . Cancer [facility]) 2005   Breast cancer   chemotherapy and radiation  . CKD (chronic kidney disease) stage 3, GFR 30-59 ml/min (HCC) 06/08/2015  . Dry eye syndrome   . Family history of lung cancer   . Family history of non-Hodgkin's lymphoma   . Fasting hyperglycemia   . HIP FRACTURE, RIGHT 05/06/2008  . History of kidney stones   . HIV DISEASE 03/27/2006  . HYPERLIPIDEMIA, MIXED 12/15/2007  . HYPERTENSION 03/27/2006  . 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 MENORRHAGIA, POSTMENOPAUSAL 02/03/2009  . Osteoarthritis of left knee 06/08/2015  . OSTEOARTHROSIS, LOCAL, SCND, UNSPC SITE 04/07/2007  . PVD 04/07/2007  . Tinea capitis   . TRIGGER FINGER 05/06/2008  . Unspecified vitamin D deficiency 08/06/2007     Family History  Problem Relation Age of Onset  . Heart attack Brother        Massive MI in 49s  . Stroke Brother        CAD  . Kidney disease Mother   . Stroke Mother   . Diabetes Mother   . Liver disease Sister   . COPD Sister        had I-131 rx of hyperthyroidism  . Diabetes Sister   . Stroke Sister   . Lung cancer Paternal Uncle        hx smoking  . Cancer Cousin   . Non-Hodgkin's lymphoma Cousin 25       cancer x3, in prostate and lung- unsure if met/spread or if primaries  . Heart failure Father   .

## 2018-04-25 NOTE — H&P (View-Only) (Signed)
 Subjective:    Patient ID: Tiara H Fath, female    DOB: 08/30/1955, 62 y.o.   MRN: [id number]  HPI 62-year-old never smoker with a history of breast cancer (lumpectomy and XRT 2005, mastectomy and adjuvant chemo 2018), HIV on rx, hypertension, mild renal insufficiency, hypothyroidism, obesity.  She has been evaluated in the emergency department in late October for dyspnea, orthopnea and inability to take a deep breath.  She was found to have a right pleural effusion and underwent thoracentesis on 04/14/2018, 2.5 L of yellow fluid, cytology with reactive mesothelial cells but no evidence for malignancy.  A CT scan of the chest 04/22/2018 was reviewed by me, shows some reaccumulation of right pleural fluid, an area of focal pleural thickening versus loculated fluid anteriorly.  I do not see any evidence for this pleural thickening on her most recent prior CT chest 05/17/2017.  She was otherwise completely asymptomatic - no pleuritic pain, no fevers.   Pleural LDH 423, total protein 5.5, WBC 1750 (63% lymphs, 37% monocyte), consistent with an exudate.  Culture negative.   ? Her hypothyroidism.     Review of Systems  Constitutional: Negative for fever and unexpected weight change.  HENT: Negative for congestion, dental problem, ear pain, nosebleeds, postnasal drip, rhinorrhea, sinus pressure, sneezing, sore throat and trouble swallowing.   Eyes: Negative for redness and itching.  Respiratory: Negative for cough, chest tightness, shortness of breath and wheezing.   Cardiovascular: Negative for palpitations and leg swelling.  Gastrointestinal: Negative for nausea and vomiting.  Genitourinary: Negative for dysuria.  Musculoskeletal: Negative for joint swelling.  Skin: Negative for rash.  Allergic/Immunologic: Positive for immunocompromised state. Negative for environmental allergies and food allergies.  Neurological: Negative for headaches.  Hematological: Does not bruise/bleed easily.    Psychiatric/Behavioral: Negative for dysphoric mood. The patient is not nervous/anxious.     Past Medical History:  Diagnosis Date  . Alopecia areata 11/28/2009  . Bell's palsy   . Cancer (HCC) 2005   Breast cancer   chemotherapy and radiation  . CKD (chronic kidney disease) stage 3, GFR 30-59 ml/min (HCC) 06/08/2015  . Dry eye syndrome   . Family history of lung cancer   . Family history of non-Hodgkin's lymphoma   . Fasting hyperglycemia   . HIP FRACTURE, RIGHT 05/06/2008  . History of kidney stones   . HIV DISEASE 03/27/2006  . HYPERLIPIDEMIA, MIXED 12/15/2007  . HYPERTENSION 03/27/2006  . 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 MENORRHAGIA, POSTMENOPAUSAL 02/03/2009  . Osteoarthritis of left knee 06/08/2015  . OSTEOARTHROSIS, LOCAL, SCND, UNSPC SITE 04/07/2007  . PVD 04/07/2007  . Tinea capitis   . TRIGGER FINGER 05/06/2008  . Unspecified vitamin D deficiency 08/06/2007     Family History  Problem Relation Age of Onset  . Heart attack Brother        Massive MI in 40s  . Stroke Brother        CAD  . Kidney disease Mother   . Stroke Mother   . Diabetes Mother   . Liver disease Sister   . COPD Sister        had I-131 rx of hyperthyroidism  . Diabetes Sister   . Stroke Sister   . Lung cancer Paternal Uncle        hx smoking  . Cancer Cousin   . Non-Hodgkin's lymphoma Cousin 25       cancer x3, in prostate and lung- unsure if met/spread or if primaries  . Heart failure Father   .    Subjective:    Patient ID: Nakyiah H Creson, female    DOB: 11/17/1955, 62 y.o.   MRN: [id number]  HPI 62-year-old never smoker with a history of breast cancer (lumpectomy and XRT 2005, mastectomy and adjuvant chemo 2018), HIV on rx, hypertension, mild renal insufficiency, hypothyroidism, obesity.  She has been evaluated in the emergency department in late October for dyspnea, orthopnea and inability to take a deep breath.  She was found to have a right pleural effusion and underwent thoracentesis on 04/14/2018, 2.5 L of yellow fluid, cytology with reactive mesothelial cells but no evidence for malignancy.  A CT scan of the chest 04/22/2018 was reviewed by me, shows some reaccumulation of right pleural fluid, an area of focal pleural thickening versus loculated fluid anteriorly.  I do not see any evidence for this pleural thickening on her most recent prior CT chest 05/17/2017.  She was otherwise completely asymptomatic - no pleuritic pain, no fevers.   Pleural LDH 423, total protein 5.5, WBC 1750 (63% lymphs, 37% monocyte), consistent with an exudate.  Culture negative.   ? Her hypothyroidism.     Review of Systems  Constitutional: Negative for fever and unexpected weight change.  HENT: Negative for congestion, dental problem, ear pain, nosebleeds, postnasal drip, rhinorrhea, sinus pressure, sneezing, sore throat and trouble swallowing.   Eyes: Negative for redness and itching.  Respiratory: Negative for cough, chest tightness, shortness of breath and wheezing.   Cardiovascular: Negative for palpitations and leg swelling.  Gastrointestinal: Negative for nausea and vomiting.  Genitourinary: Negative for dysuria.  Musculoskeletal: Negative for joint swelling.  Skin: Negative for rash.  Allergic/Immunologic: Positive for immunocompromised state. Negative for environmental allergies and food allergies.  Neurological: Negative for headaches.  Hematological: Does not bruise/bleed easily.    Psychiatric/Behavioral: Negative for dysphoric mood. The patient is not nervous/anxious.     Past Medical History:  Diagnosis Date  . Alopecia areata 11/28/2009  . Bell's palsy   . Cancer (HCC) 2005   Breast cancer   chemotherapy and radiation  . CKD (chronic kidney disease) stage 3, GFR 30-59 ml/min (HCC) 06/08/2015  . Dry eye syndrome   . Family history of lung cancer   . Family history of non-Hodgkin's lymphoma   . Fasting hyperglycemia   . HIP FRACTURE, RIGHT 05/06/2008  . History of kidney stones   . HIV DISEASE 03/27/2006  . HYPERLIPIDEMIA, MIXED 12/15/2007  . HYPERTENSION 03/27/2006  . 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 MENORRHAGIA, POSTMENOPAUSAL 02/03/2009  . Osteoarthritis of left knee 06/08/2015  . OSTEOARTHROSIS, LOCAL, SCND, UNSPC SITE 04/07/2007  . PVD 04/07/2007  . Tinea capitis   . TRIGGER FINGER 05/06/2008  . Unspecified vitamin D deficiency 08/06/2007     Family History  Problem Relation Age of Onset  . Heart attack Brother        Massive MI in 40s  . Stroke Brother        CAD  . Kidney disease Mother   . Stroke Mother   . Diabetes Mother   . Liver disease Sister   . COPD Sister        had I-131 rx of hyperthyroidism  . Diabetes Sister   . Stroke Sister   . Lung cancer Paternal Uncle        hx smoking  . Cancer Cousin   . Non-Hodgkin's lymphoma Cousin 25       cancer x3, in prostate and lung- unsure if met/spread or if primaries  . Heart failure Father   .    Subjective:    Patient ID: Angeliki H Griego, female    DOB: 12/06/1955, 62 y.o.   MRN: [id number]  HPI 62-year-old never smoker with a history of breast cancer (lumpectomy and XRT 2005, mastectomy and adjuvant chemo 2018), HIV on rx, hypertension, mild renal insufficiency, hypothyroidism, obesity.  She has been evaluated in the emergency department in late October for dyspnea, orthopnea and inability to take a deep breath.  She was found to have a right pleural effusion and underwent thoracentesis on 04/14/2018, 2.5 L of yellow fluid, cytology with reactive mesothelial cells but no evidence for malignancy.  A CT scan of the chest 04/22/2018 was reviewed by me, shows some reaccumulation of right pleural fluid, an area of focal pleural thickening versus loculated fluid anteriorly.  I do not see any evidence for this pleural thickening on her most recent prior CT chest 05/17/2017.  She was otherwise completely asymptomatic - no pleuritic pain, no fevers.   Pleural LDH 423, total protein 5.5, WBC 1750 (63% lymphs, 37% monocyte), consistent with an exudate.  Culture negative.   ? Her hypothyroidism.     Review of Systems  Constitutional: Negative for fever and unexpected weight change.  HENT: Negative for congestion, dental problem, ear pain, nosebleeds, postnasal drip, rhinorrhea, sinus pressure, sneezing, sore throat and trouble swallowing.   Eyes: Negative for redness and itching.  Respiratory: Negative for cough, chest tightness, shortness of breath and wheezing.   Cardiovascular: Negative for palpitations and leg swelling.  Gastrointestinal: Negative for nausea and vomiting.  Genitourinary: Negative for dysuria.  Musculoskeletal: Negative for joint swelling.  Skin: Negative for rash.  Allergic/Immunologic: Positive for immunocompromised state. Negative for environmental allergies and food allergies.  Neurological: Negative for headaches.  Hematological: Does not bruise/bleed easily.    Psychiatric/Behavioral: Negative for dysphoric mood. The patient is not nervous/anxious.     Past Medical History:  Diagnosis Date  . Alopecia areata 11/28/2009  . Bell's palsy   . Cancer (HCC) 2005   Breast cancer   chemotherapy and radiation  . CKD (chronic kidney disease) stage 3, GFR 30-59 ml/min (HCC) 06/08/2015  . Dry eye syndrome   . Family history of lung cancer   . Family history of non-Hodgkin's lymphoma   . Fasting hyperglycemia   . HIP FRACTURE, RIGHT 05/06/2008  . History of kidney stones   . HIV DISEASE 03/27/2006  . HYPERLIPIDEMIA, MIXED 12/15/2007  . HYPERTENSION 03/27/2006  . 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 MENORRHAGIA, POSTMENOPAUSAL 02/03/2009  . Osteoarthritis of left knee 06/08/2015  . OSTEOARTHROSIS, LOCAL, SCND, UNSPC SITE 04/07/2007  . PVD 04/07/2007  . Tinea capitis   . TRIGGER FINGER 05/06/2008  . Unspecified vitamin D deficiency 08/06/2007     Family History  Problem Relation Age of Onset  . Heart attack Brother        Massive MI in 40s  . Stroke Brother        CAD  . Kidney disease Mother   . Stroke Mother   . Diabetes Mother   . Liver disease Sister   . COPD Sister        had I-131 rx of hyperthyroidism  . Diabetes Sister   . Stroke Sister   . Lung cancer Paternal Uncle        hx smoking  . Cancer Cousin   . Non-Hodgkin's lymphoma Cousin 25       cancer x3, in prostate and lung- unsure if met/spread or if primaries  . Heart failure Father   .    Subjective:    Patient ID: Jakaya H Dosanjh, female    DOB: 12/27/1955, 62 y.o.   MRN: [id number]  HPI 62-year-old never smoker with a history of breast cancer (lumpectomy and XRT 2005, mastectomy and adjuvant chemo 2018), HIV on rx, hypertension, mild renal insufficiency, hypothyroidism, obesity.  She has been evaluated in the emergency department in late October for dyspnea, orthopnea and inability to take a deep breath.  She was found to have a right pleural effusion and underwent thoracentesis on 04/14/2018, 2.5 L of yellow fluid, cytology with reactive mesothelial cells but no evidence for malignancy.  A CT scan of the chest 04/22/2018 was reviewed by me, shows some reaccumulation of right pleural fluid, an area of focal pleural thickening versus loculated fluid anteriorly.  I do not see any evidence for this pleural thickening on her most recent prior CT chest 05/17/2017.  She was otherwise completely asymptomatic - no pleuritic pain, no fevers.   Pleural LDH 423, total protein 5.5, WBC 1750 (63% lymphs, 37% monocyte), consistent with an exudate.  Culture negative.   ? Her hypothyroidism.     Review of Systems  Constitutional: Negative for fever and unexpected weight change.  HENT: Negative for congestion, dental problem, ear pain, nosebleeds, postnasal drip, rhinorrhea, sinus pressure, sneezing, sore throat and trouble swallowing.   Eyes: Negative for redness and itching.  Respiratory: Negative for cough, chest tightness, shortness of breath and wheezing.   Cardiovascular: Negative for palpitations and leg swelling.  Gastrointestinal: Negative for nausea and vomiting.  Genitourinary: Negative for dysuria.  Musculoskeletal: Negative for joint swelling.  Skin: Negative for rash.  Allergic/Immunologic: Positive for immunocompromised state. Negative for environmental allergies and food allergies.  Neurological: Negative for headaches.  Hematological: Does not bruise/bleed easily.    Psychiatric/Behavioral: Negative for dysphoric mood. The patient is not nervous/anxious.     Past Medical History:  Diagnosis Date  . Alopecia areata 11/28/2009  . Bell's palsy   . Cancer (HCC) 2005   Breast cancer   chemotherapy and radiation  . CKD (chronic kidney disease) stage 3, GFR 30-59 ml/min (HCC) 06/08/2015  . Dry eye syndrome   . Family history of lung cancer   . Family history of non-Hodgkin's lymphoma   . Fasting hyperglycemia   . HIP FRACTURE, RIGHT 05/06/2008  . History of kidney stones   . HIV DISEASE 03/27/2006  . HYPERLIPIDEMIA, MIXED 12/15/2007  . HYPERTENSION 03/27/2006  . 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 MENORRHAGIA, POSTMENOPAUSAL 02/03/2009  . Osteoarthritis of left knee 06/08/2015  . OSTEOARTHROSIS, LOCAL, SCND, UNSPC SITE 04/07/2007  . PVD 04/07/2007  . Tinea capitis   . TRIGGER FINGER 05/06/2008  . Unspecified vitamin D deficiency 08/06/2007     Family History  Problem Relation Age of Onset  . Heart attack Brother        Massive MI in 40s  . Stroke Brother        CAD  . Kidney disease Mother   . Stroke Mother   . Diabetes Mother   . Liver disease Sister   . COPD Sister        had I-131 rx of hyperthyroidism  . Diabetes Sister   . Stroke Sister   . Lung cancer Paternal Uncle        hx smoking  . Cancer Cousin   . Non-Hodgkin's lymphoma Cousin 25       cancer x3, in prostate and lung- unsure if met/spread or if primaries  . Heart failure Father   .

## 2018-04-28 ENCOUNTER — Other Ambulatory Visit (HOSPITAL_COMMUNITY): Payer: Self-pay | Admitting: Physician Assistant

## 2018-04-28 ENCOUNTER — Encounter (HOSPITAL_COMMUNITY): Payer: Self-pay | Admitting: Physician Assistant

## 2018-04-28 ENCOUNTER — Ambulatory Visit (HOSPITAL_COMMUNITY)
Admission: RE | Admit: 2018-04-28 | Discharge: 2018-04-28 | Disposition: A | Discharge status: Home / Self Care | Payer: Medicare HMO | Source: Ambulatory Visit | Attending: Emergency Medicine | Admitting: Emergency Medicine

## 2018-04-28 ENCOUNTER — Ambulatory Visit (HOSPITAL_COMMUNITY)
Admission: RE | Admit: 2018-04-28 | Discharge: 2018-04-28 | Disposition: A | Discharge status: Home / Self Care | Payer: Medicare HMO | Source: Ambulatory Visit | Attending: Physician Assistant | Admitting: Physician Assistant

## 2018-04-28 DIAGNOSIS — J90 Pleural effusion, not elsewhere classified: Secondary | ICD-10-CM | POA: Diagnosis not present

## 2018-04-28 DIAGNOSIS — R846 Abnormal cytological findings in specimens from respiratory organs and thorax: Secondary | ICD-10-CM | POA: Diagnosis not present

## 2018-04-28 DIAGNOSIS — J9811 Atelectasis: Secondary | ICD-10-CM | POA: Insufficient documentation

## 2018-04-28 HISTORY — PX: IR THORACENTESIS ASP PLEURAL SPACE W/IMG GUIDE: IMG5380

## 2018-04-28 MED ORDER — LIDOCAINE HCL 1 % IJ SOLN
INTRAMUSCULAR | Status: AC | PRN
  Administered 2018-04-28: 10 mL

## 2018-04-28 MED ORDER — LIDOCAINE HCL 1 % IJ SOLN
INTRAMUSCULAR | Status: AC
  Filled 2018-04-28: qty 20

## 2018-04-28 NOTE — Procedures (Signed)
PROCEDURE SUMMARY:  Successful US guided right thoracentesis. Yielded 2L of clear orange/amber fluid. Patient tolerated procedure well. No immediate complications.  Specimen was sent for labs.  Post procedure chest X-ray reveals no pneumothorax  WENDY S BLAIR PA-C 04/28/2018 10:44 AM

## 2018-05-01 ENCOUNTER — Encounter (HOSPITAL_COMMUNITY)
Admission: RE | Admit: 2018-05-01 | Discharge: 2018-05-01 | Disposition: A | Discharge status: Home / Self Care | Payer: Medicare HMO | Source: Ambulatory Visit | Attending: Emergency Medicine | Admitting: Emergency Medicine

## 2018-05-01 DIAGNOSIS — Z853 Personal history of malignant neoplasm of breast: Secondary | ICD-10-CM | POA: Insufficient documentation

## 2018-05-01 DIAGNOSIS — C50919 Malignant neoplasm of unspecified site of unspecified female breast: Secondary | ICD-10-CM | POA: Diagnosis not present

## 2018-05-01 LAB — GLUCOSE, CAPILLARY: Glucose-Capillary: 83 mg/dL (ref 70–99)

## 2018-05-01 MED ORDER — FLUDEOXYGLUCOSE F - 18 (FDG) INJECTION
14.4000 | Freq: Once | INTRAVENOUS | Status: AC | PRN
  Administered 2018-05-01: 14.4 via INTRAVENOUS

## 2018-05-05 ENCOUNTER — Encounter: Payer: Self-pay | Admitting: Nurse Practitioner

## 2018-05-05 ENCOUNTER — Ambulatory Visit (INDEPENDENT_AMBULATORY_CARE_PROVIDER_SITE_OTHER): Payer: Medicare HMO | Admitting: Nurse Practitioner

## 2018-05-05 VITALS — BP 128/66 | HR 73 | Ht 67.0 in | Wt 273.2 lb

## 2018-05-05 DIAGNOSIS — J90 Pleural effusion, not elsewhere classified: Secondary | ICD-10-CM

## 2018-05-05 DIAGNOSIS — Z853 Personal history of malignant neoplasm of breast: Secondary | ICD-10-CM | POA: Diagnosis not present

## 2018-05-05 NOTE — Assessment & Plan Note (Signed)
Discussed PET scan with patient Patient Instructions  Discussed PET scan in office today Concern for area to right lung and will discuss with Dr. Lamonte Sakai on Wednesday Discussed with patient that she might need a biopsy due to abnormal PET scan Will place a referral back to oncology - patient has not been seen there in 1 year Stay active  Eat 6 small meals a day  Stay well hydrated Will call with follow up with Dr. Lamonte Sakai.  Please call sooner if shortness of breath returns

## 2018-05-05 NOTE — Patient Instructions (Signed)
Discussed PET scan in office today Concern for area to right lung and will discuss with Dr. Lamonte Sakai on Wednesday Discussed with patient that she might need a biopsy due to abnormal PET scan Will place a referral back to oncology - patient has not been seen there in 1 year Stay active  Eat 6 small meals a day  Stay well hydrated Will call with follow up with Dr. Lamonte Sakai.  Please call sooner if shortness of breath returns

## 2018-05-05 NOTE — Progress Notes (Signed)
@Patient  ID: Kayla Price, female    DOB: 25-Jun-1956, 62 y.o.   MRN: [id number]  Chief Complaint  Patient presents with  . Follow-up    Referring/ provider: Lauree Chandler, NP  HPI 62 year old never smoker with history of breast cancer (lumpectomy and XRT 2005, mastectomy and adjuvant chemo 2018), HIV on rx, hypertension, mild renal insufficiency, hypothyroidism, obesity. Recent history of thoracentesis x2 followed by Dr. Lamonte Sakai.   Tests: CT chest 04/22/18 - Re-accumulation of large right pleural effusion since the thoracentesis of 04/14/2018. Anterior right upper lobe pleural nodularity may indicate loculated pleural fluid versus focal pleural thickening. PET 05/02/18 - Extensive hypermetabolic pleural based tumor throughout the right hemithorax compatible with pleural metastasis. There is a moderate to large partially loculated right pleural effusion. Hypermetabolic right CP angle nodal metastasis. No evidence for metastatic disease to the abdomen or pelvis. No bone metastasis.   OV 05/05/18 - follow up after PET scan Patient presents to discuss recent PET scan results. She was evaluated in the emergency department in late October for dyspnea, orthopnea and inability to take a deep breath.  She was found to have a right pleural effusion and underwent thoracentesis on 04/14/2018, 2.5 L of yellow fluid, cytology with reactive mesothelial cells but no evidence for malignancy.  A CT scan of the chest 04/22/2018 showed some reaccumulation of right pleural fluid, an area of focal pleural thickening versus loculated fluid anteriorly. She states that she has felt much better since last thoracentesis. Her shortness of breath has not returned. She sees Dr. Jana Hakim in oncology, but has not needed to see him in the last year.      Allergies  Allergen Reactions  . Lisinopril Anaphylaxis and Swelling    Swelling of tongue and mouth 11/05/16- tolerates Olmesartan  . Pepcid [Famotidine]  Other (See Comments)    PPI H2, BLOCKERS LOWER GASTRIC PH WHICH WOULD LEAD TO SUBTHERAPEUTIC RILPIVIRINE LEVELS AND POTENTIAL VIROLOGICAL FAILURE WITH RESISTANCE  . Prilosec [Omeprazole] Other (See Comments)    PPI H2, BLOCKERS LOWER GASTRIC PH WHICH WOULD LEAD TO SUBTHERAPEUTIC RILPIVIRINE LEVELS AND POTENTIAL VIROLOGICAL FAILURE WITH RESISTANCE  . Tums [Calcium Carbonate Antacid] Other (See Comments)    TUMS ANTACIDS CAN LOWER GASTRIC PH WHICH COULD  LEAD TO SUBTHERAPEUTIC RILPIVIRINE LEVELS AND POTENTIAL VIROLOGICAL FAILURE WITH RESISTANCE TUMS CAN BE GIVEN BUT NEED CONSULT WITH ID PHARMACY RE TIMING. I PREFER HER TO AVOID ALL TOGETHER    Immunization History  Administered Date(s) Administered  . Hepatitis B 07/11/1992, 11/17/2001, 03/02/2002, 04/09/2011  . Influenza Split 04/10/2011, 03/05/2012  . Influenza Whole 03/27/2006, 04/07/2007, 05/06/2008, 05/16/2009, 09/27/2010  . Influenza,inj,Quad PF,6+ Mos 03/09/2013, 04/23/2014, 03/22/2016, 04/18/2018  . Influenza-Unspecified 06/08/2015, 03/06/2017  . Pneumococcal Conjugate-13 01/16/2018  . Pneumococcal Polysaccharide-23 03/27/2006, 02/27/2012    Past Medical History:  Diagnosis Date  . Alopecia areata 11/28/2009  . Bell's palsy   . Cancer [facility]) 2005   Breast cancer   chemotherapy and radiation  . CKD (chronic kidney disease) stage 3, GFR 30-59 ml/min (HCC) 06/08/2015  . Dry eye syndrome   . Family history of lung cancer   . Family history of non-Hodgkin's lymphoma   . Fasting hyperglycemia   . HIP FRACTURE, RIGHT 05/06/2008  . History of kidney stones   . HIV DISEASE 03/27/2006  . HYPERLIPIDEMIA, MIXED 12/15/2007  . HYPERTENSION 03/27/2006  . 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 MENORRHAGIA, POSTMENOPAUSAL 02/03/2009  . Osteoarthritis of left knee 06/08/2015  . OSTEOARTHROSIS, LOCAL, SCND, UNSPC SITE  04/07/2007  . PVD 04/07/2007  . Tinea capitis   . TRIGGER FINGER 05/06/2008  . Unspecified vitamin D deficiency 08/06/2007     Tobacco History: Social History   Tobacco Use  Smoking Status Never Smoker  Smokeless Tobacco Never Used   Counseling given: Not Answered   Outpatient Encounter Medications as of 05/05/2018  Medication Sig  . CALCIUM-VITAMIN D PO Take 1 tablet by mouth as needed.   . Cholecalciferol (VITAMIN D3) 2000 units TABS Take 2,000 Units by mouth daily with lunch.  . darunavir-cobicistat (PREZCOBIX) 800-150 MG tablet TAKE 1 TABLET BY MOUTH DAILY. SWALLOW WHOLE. DO NOT CRUSH, BREAK OR CHEW TABLETS. TAKE WITH FOOD.  . Dolutegravir-Rilpivirine (JULUCA) 50-25 MG TABS Take 1 tablet by mouth daily with breakfast. Take with the Prezcobix.  Marland Kitchen HYDROcodone-acetaminophen (NORCO/VICODIN) 5-325 MG tablet TAKE 1 TABLET BY MOUTH EVERY 6 HOURS AS NEEDED FOR MODERATE PAIN  . levothyroxine (SYNTHROID, LEVOTHROID) 150 MCG tablet Take 1 tablet (150 mcg total) by mouth daily before breakfast.  . loratadine (CLARITIN) 10 MG tablet Take 10 mg by mouth as needed for itching. Reported on 06/08/2015  . maraviroc (SELZENTRY) 150 MG tablet Take 1 tablet (150 mg total) by mouth 2 (two) times daily.  . Multiple Vitamins-Minerals (HAIR/SKIN/NAILS) TABS Take 1 tablet by mouth daily.  Marland Kitchen olmesartan-hydrochlorothiazide (BENICAR HCT) 40-25 MG tablet TAKE 1 TABLET BY MOUTH DAILY.  Marland Kitchen Omega-3 Fatty Acids (FISH OIL PO) Take 1 capsule by mouth daily with lunch.  . Pitavastatin Calcium 4 MG TABS Take 1 tablet (4 mg total) by mouth daily. This may be placebo, study provided. Do not dispense.  . Probiotic Product (PROBIOTIC PO) Take 1 capsule by mouth daily with lunch.   No facility-administered encounter medications on file as of 05/05/2018.      Review of Systems  Review of Systems  Constitutional: Negative.  Negative for chills and fever.  HENT: Negative.   Respiratory: Negative for cough, shortness of breath and wheezing.   Cardiovascular: Negative.  Negative for chest pain, palpitations and leg swelling.   Gastrointestinal: Negative.   Allergic/Immunologic: Negative.   Neurological: Negative.   Psychiatric/Behavioral: Negative.        Physical Exam  BP 128/66 (BP Location: Left Arm, Patient Position: Sitting, Cuff Size: Large)   Pulse 73   Ht 5\' 7"  (1.702 m)   Wt 273 lb 3.2 oz (123.9 kg)   LMP 11/06/2003   SpO2 96%   BMI 42.79 kg/m   Wt Readings from Last 5 Encounters:  05/05/18 273 lb 3.2 oz (123.9 kg)  04/25/18 276 lb 12.8 oz (125.6 kg)  04/18/18 275 lb 12.8 oz (125.1 kg)  04/14/18 273 lb (123.8 kg)  03/26/18 280 lb 4 oz (127.1 kg)     Physical Exam  Constitutional: She is oriented to person, place, and time. She appears well-developed and well-nourished. No distress.  Cardiovascular: Normal rate and regular rhythm.  Pulmonary/Chest: Effort normal and breath sounds normal. No respiratory distress. She has no wheezes.  Musculoskeletal: She exhibits no edema.  Neurological: She is alert and oriented to person, place, and time.  Psychiatric: She has a normal mood and affect.  Nursing note and vitals reviewed.    Imaging: Dg Chest 1 View  Result Date: 04/28/2018 CLINICAL DATA:  Status post right thoracentesis today. EXAM: CHEST  1 VIEW COMPARISON:  CT chest 04/22/2018. Single-view of the chest 04/14/2018. FINDINGS: There is no pneumothorax after right thoracentesis. Small residual right pleural effusion and basilar atelectasis are noted. The  left lung is clear. Heart size is normal. No acute or focal bony abnormality. IMPRESSION: Negative for pneumothorax after right thoracentesis. Small residual right pleural effusion and basilar atelectasis. Electronically Signed   By: Inge Rise M.D.   On: 04/28/2018 10:33   Dg Chest 1 View  Result Date: 04/14/2018 CLINICAL DATA:  Status post right thoracentesis. EXAM: CHEST  1 VIEW COMPARISON:  Chest x-ray from same day at 10:19 a.m. FINDINGS: The heart size and mediastinal contours are within normal limits. Normal pulmonary  vascularity. Interval decrease in size of now small right pleural effusion with improved aeration of the right lower lobe. The left lung is clear. No pneumothorax. No acute osseous abnormality. IMPRESSION: Decreased now small right pleural effusion.  No pneumothorax. Electronically Signed   By: Titus Dubin M.D.   On: 04/14/2018 16:28   Dg Chest 2 View  Result Date: 04/14/2018 CLINICAL DATA:  Follow-up pleural effusion. EXAM: CHEST - 2 VIEW COMPARISON:  Earlier film, same date. FINDINGS: Persistent moderate-sized right pleural effusion approximately halfway up the thorax. No pneumothorax. The left lung is clear. Heart is normal in size and stable. Moderate atelectasis overlying the effusion. IMPRESSION: Persistent moderate-sized right pleural effusion. Electronically Signed   By: Marijo Sanes M.D.   On: 04/14/2018 10:36   Dg Chest 2 View  Result Date: 04/14/2018 CLINICAL DATA:  Shortness of breath EXAM: CHEST - 2 VIEW COMPARISON:  05/17/2017 FINDINGS: Cardiac shadow is stable in appearance. New right-sided pleural effusion with likely underlying infiltrate is present on the right. The left lung is clear. No bony abnormality is seen. IMPRESSION: Right-sided pleural effusion with likely underlying infiltrate/atelectasis on the right. 50% opacification of the right hemithorax is noted. Electronically Signed   By: Inez Catalina M.D.   On: 04/14/2018 09:12   Ct Chest Wo Contrast  Result Date: 04/22/2018 CLINICAL DATA:  Pleural effusion. History of breast cancer. Thoracentesis performed 04/14/2018 EXAM: CT CHEST WITHOUT CONTRAST TECHNIQUE: Multidetector CT imaging of the chest was performed following the standard protocol without IV contrast. COMPARISON:  Chest CT 05/17/2017 Chest radiograph 04/14/2018 FINDINGS: Cardiovascular: Normal heart size with no pericardial effusion. No aortic atherosclerotic calcification. Mediastinum/Nodes: Prior right axillary nodal dissection. No enlarged left axillary nodes.  Lungs/Pleura: Large right pleural effusion, markedly increased from the post thoracentesis radiograph of 04/14/2018. There is nodularity along the anterior pleural surface of the right upper lobe, possibly loculated fluid. Left lung is clear. Upper Abdomen: No acute abnormality. Musculoskeletal: No chest wall mass or suspicious bone lesions identified. IMPRESSION: 1. Re-accumulation of large right pleural effusion since the thoracentesis of 04/14/2018. 2. Anterior right upper lobe pleural nodularity may indicate loculated pleural fluid versus focal pleural thickening. Electronically Signed   By: Ulyses Jarred M.D.   On: 04/22/2018 17:34   Nm Pet Image Initial (pi) Skull Base To Thigh  Result Date: 05/02/2018 CLINICAL DATA:  Subsequent treatment strategy for breast cancer current. EXAM: NUCLEAR MEDICINE PET SKULL BASE TO THIGH TECHNIQUE: 14.4 mCi F-18 FDG was injected intravenously. Full-ring PET imaging was performed from the skull base to thigh after the radiotracer. CT data was obtained and used for attenuation correction and anatomic localization. Fasting blood glucose: 83 mg/dl COMPARISON:  CT chest 04/22/2018 and CT CAP 05/17/2017 FINDINGS: Mediastinal blood pool activity: SUV max 3.65 NECK: No hypermetabolic lymph nodes in the neck. Incidental CT findings: none CHEST: Status post right mastectomy and right axillary nodal dissection. No hypermetabolic axillary, supraclavicular lymph nodes. No hypermetabolic mediastinal or hilar lymph nodes. Moderate  to large loculated right pleural effusion identified. There is extensive hypermetabolic pleural disease overlying the right lung. Along the paramediastinal right upper lobe there is a pleural nodule which measures 3 cm and has an SUV max of 25.9. Within the right lung base paramediastinal subpleural nodule has an SUV max of 8.98. Overlying the anterior right upper lobe pleural nodule measuring 1.3 cm has an SUV max of 10.65. Along the major fissure there is a  1.5 cm nodule within SUV max of 8.98. No left pleural effusion. No hypermetabolic nodules within the left hemithorax. Within the right CP angle there is a hypermetabolic lymph node which measures 2.2 cm and has an SUV max of 9.4. Incidental CT findings: Aortic atherosclerosis noted. No pericardial effusion. Increased caliber of the ascending thoracic aorta measures 4.1 cm. ABDOMEN/PELVIS: No abnormal radiotracer uptake identified within the liver, pancreas, or spleen. No abnormal uptake within the adrenal glands. No hypermetabolic abdominopelvic lymph nodes. Incidental CT findings: Small stones noted within the gallbladder. No abdominopelvic ascites. SKELETON: No focal hypermetabolic activity to suggest skeletal metastasis. Incidental CT findings: none IMPRESSION: 1. Extensive hypermetabolic pleural based tumor throughout the right hemithorax compatible with pleural metastasis. There is a moderate to large partially loculated right pleural effusion. 2. Hypermetabolic right CP angle nodal metastasis. 3. No evidence for metastatic disease to the abdomen or pelvis. No bone metastasis. 4.  Aortic Atherosclerosis (ICD10-I70.0). 5. Mild aneurysmal dilatation of the ascending thoracic aorta. Recommend annual imaging followup by CTA or MRA. This recommendation follows 2010 ACCF/AHA/AATS/ACR/ASA/SCA/SCAI/SIR/STS/SVM Guidelines for the Diagnosis and Management of Patients with Thoracic Aortic Disease. Circulation. 2010; 121: [id number] 6. Gallstones. Electronically Signed   By: Kerby Moors M.D.   On: 05/02/2018 09:19   Ir Thoracentesis Asp Pleural Space W/img Guide  Result Date: 04/28/2018 INDICATION: Chronic kidney disease, breast cancer, dyspnea, right pleural effusion. Request for diagnostic and therapeutic right thoracentesis. EXAM: ULTRASOUND GUIDED RIGHT THORACENTESIS MEDICATIONS: 1% lidocaine 10 mL COMPLICATIONS: None immediate. PROCEDURE: An ultrasound guided thoracentesis was thoroughly discussed with the  patient and questions answered. The benefits, risks, alternatives and complications were also discussed. The patient understands and wishes to proceed with the procedure. Written consent was obtained. Ultrasound was performed to localize and mark an adequate pocket of fluid in the right chest. The area was then prepped and draped in the normal sterile fashion. 1% Lidocaine was used for local anesthesia. Under ultrasound guidance a 6 Fr Safe-T-Centesis catheter was introduced. Thoracentesis was performed. The catheter was removed and a dressing applied. FINDINGS: A total of approximately 2 L of clear orange/amber fluid was removed. Samples were sent to the laboratory as requested by the clinical team. IMPRESSION: Successful ultrasound guided right thoracentesis yielding 2 L of pleural fluid. No pneumothorax on post-procedure chest x-ray. Read by: Gareth Eagle, PA-C Electronically Signed   By: Aletta Edouard M.D.   On: 04/28/2018 10:43   Ir Thoracentesis Asp Pleural Space W/img Guide  Result Date: 04/14/2018 INDICATION: Patient with history of chronic kidney disease, breast cancer, dyspnea, right pleural effusion. Request made for diagnostic and therapeutic right thoracentesis. EXAM: ULTRASOUND GUIDED DIAGNOSTIC AND THERAPEUTIC RIGHT THORACENTESIS MEDICATIONS: None COMPLICATIONS: None immediate. PROCEDURE: An ultrasound guided thoracentesis was thoroughly discussed with the patient and questions answered. The benefits, risks, alternatives and complications were also discussed. The patient understands and wishes to proceed with the procedure. Written consent was obtained. Ultrasound was performed to localize and mark an adequate pocket of fluid in the right chest. The area was then prepped and draped  in the normal sterile fashion. 1% Lidocaine was used for local anesthesia. Under ultrasound guidance a 6 Fr Safe-T-Centesis catheter was introduced. Thoracentesis was performed. The catheter was removed and a dressing  applied. FINDINGS: A total of approximately 2.5 liters of yellow fluid was removed. Samples were sent to the laboratory as requested by the clinical team. IMPRESSION: Successful ultrasound guided diagnostic and therapeutic right thoracentesis yielding 2.5 liters of pleural fluid. Read by: Rowe Robert, PA-C Electronically Signed   By: Markus Daft M.D.   On: 04/14/2018 17:04     Assessment & Plan:   Pleural effusion on right Discussed PET scan with patient Patient Instructions  Discussed PET scan in office today Concern for area to right lung and will discuss with Dr. Lamonte Sakai on Wednesday Discussed with patient that she might need a biopsy due to abnormal PET scan Will place a referral back to oncology - patient has not been seen there in 1 year Stay active  Eat 6 small meals a day  Stay well hydrated Will call with follow up with Dr. Lamonte Sakai.  Please call sooner if shortness of breath returns       Fenton Foy, NP 05/05/2018

## 2018-05-07 ENCOUNTER — Other Ambulatory Visit: Payer: Self-pay | Admitting: Oncology

## 2018-05-07 ENCOUNTER — Other Ambulatory Visit: Payer: Self-pay | Admitting: *Deleted

## 2018-05-07 ENCOUNTER — Encounter: Payer: Self-pay | Admitting: Nurse Practitioner

## 2018-05-07 DIAGNOSIS — M1990 Unspecified osteoarthritis, unspecified site: Secondary | ICD-10-CM

## 2018-05-07 DIAGNOSIS — C50411 Malignant neoplasm of upper-outer quadrant of right female breast: Secondary | ICD-10-CM

## 2018-05-07 DIAGNOSIS — Z171 Estrogen receptor negative status [ER-]: Principal | ICD-10-CM

## 2018-05-07 MED ORDER — HYDROCODONE-ACETAMINOPHEN 5-325 MG PO TABS: ORAL_TABLET | ORAL | 0 refills | Status: DC

## 2018-05-07 NOTE — Telephone Encounter (Signed)
Received fax refill request from [place] Verified LR: 02/27/2018 Pended Rx and sent to [facility] for approval.

## 2018-05-07 NOTE — Progress Notes (Signed)
I was called today by Dr. Agustina Caroli physician's assistant, Lazaro Arms, regarding Kayla Price.  Kayla Price saw a urgent care regarding shortness of breath.  She was found to have a right-sided pleural effusion and was referred to the emergency room on 04/14/2018.  She underwent right-sided thoracentesis that same day, with the cytology ([id number] [phone]) showing only reactive mesothelial cells.  [facility], and a CT of the chest without contrast was obtained 04/22/2018 doing reaccumulation of a large right effusion and some anterior right upper lobe pleural nodularity.  This was new as compared to the CT scan from November 2018.  Her graph she then saw Dr. Lamonte Sakai and underwent a second thoracentesis on 04/28/2018, showing some atypical cells, which by immunohistochemistry were favored to be reactive mesothelial cells ([id number]).  A PET scan was then obtained 05/01/2018.  Intensive hypermetabolic pleural disease over the right lung, with an SUV max of 25.9 in a paramediastinal pleural nodule, second nodule in the right lung base with an SUV max of 8.98, as well as other findings on the right, but no findings on the left hemothorax.  There was no pericardial effusion.  There was no involvement of the liver.  There were no bony lesions.  It would be very helpful to have a biopsy to see if we are dealing with the same breast cancer as before, which is most likely, and if so whether it has the same prognostic profile.  If the biopsy can be obtained safely that would be preferable.  If not we can do a third thoracentesis and hope we can obtain some cancer cells assuming what we are dealing is indeed cancer.  I will schedule Kayla Price to see me in about 10 days by which time we may be able to have those results

## 2018-05-08 ENCOUNTER — Other Ambulatory Visit: Payer: Self-pay

## 2018-05-08 ENCOUNTER — Other Ambulatory Visit: Payer: Self-pay | Admitting: Oncology

## 2018-05-08 DIAGNOSIS — R918 Other nonspecific abnormal finding of lung field: Secondary | ICD-10-CM

## 2018-05-09 MED FILL — HYDROCODON-APAP 5-325: 5-325 | 15 days supply | Qty: 60 | Fill #0

## 2018-05-12 ENCOUNTER — Inpatient Hospital Stay: Payer: Medicare HMO | Attending: Adult Health | Admitting: Adult Health

## 2018-05-12 ENCOUNTER — Telehealth: Payer: Self-pay | Admitting: Oncology

## 2018-05-12 VITALS — BP 150/95 | HR 67 | Temp 97.7°F | Resp 19 | Ht 67.0 in | Wt 273.9 lb

## 2018-05-12 DIAGNOSIS — I10 Essential (primary) hypertension: Secondary | ICD-10-CM | POA: Insufficient documentation

## 2018-05-12 DIAGNOSIS — Z9011 Acquired absence of right breast and nipple: Secondary | ICD-10-CM | POA: Insufficient documentation

## 2018-05-12 DIAGNOSIS — C50411 Malignant neoplasm of upper-outer quadrant of right female breast: Secondary | ICD-10-CM | POA: Insufficient documentation

## 2018-05-12 DIAGNOSIS — J90 Pleural effusion, not elsewhere classified: Secondary | ICD-10-CM | POA: Insufficient documentation

## 2018-05-12 DIAGNOSIS — Z9221 Personal history of antineoplastic chemotherapy: Secondary | ICD-10-CM | POA: Diagnosis not present

## 2018-05-12 DIAGNOSIS — E039 Hypothyroidism, unspecified: Secondary | ICD-10-CM | POA: Insufficient documentation

## 2018-05-12 DIAGNOSIS — Z171 Estrogen receptor negative status [ER-]: Secondary | ICD-10-CM | POA: Diagnosis not present

## 2018-05-12 DIAGNOSIS — R918 Other nonspecific abnormal finding of lung field: Secondary | ICD-10-CM | POA: Insufficient documentation

## 2018-05-12 NOTE — Progress Notes (Addendum)
[name]  [phone] [phone]     ID: Kayla Price DOB: [birth year]  MR#: [id number]  JGG#:[id number]  Patient Care Team: Kayla Chandler, NP as PCP - General (Nurse Practitioner) Kayla Price, Kayla Islam, Price as PCP - Infectious Diseases (Infectious Diseases) Kayla Jacks, Price as Consulting Physician (Ophthalmology) Kayla Shin, Price as Consulting Physician (Endocrinology) Kayla Pear, Price as Consulting Physician (Orthopedic Surgery) Kayla Price, Kayla Dad, Price as Consulting Physician (Oncology) Kayla Skates, Price as Consulting Physician (General Surgery) Kayla Jude, Price as Consulting Physician (Obstetrics and Gynecology) Kayla Price, Kayla Massed, NP as Nurse Practitioner (Hematology and Oncology) Kayla Price:  CHIEF COMPLAINT: Triple negative breast cancer, recurrent  CURRENT TREATMENT: Observation  INTERVAL HISTORY: Kayla Price returns today for follow-up and treatment of her estrogen receptor negative breast cancer.  Since her last visit here in 10/2017, she has undergone ER evaluation on 04/14/2018 due to shortness of breath accompanied by orthopnea.  Chest xray showed right pleural effusion, she has undergone two thoracentesis, on 10/21 and 11/4.  Her shortness of breath is improved today. Cytology has been inconclusive.  CT chest showed pleural thickening.  She was referred to Kayla Price in pulmonology who ordered a PET scan done on 05/01/2018 that showed pleural metastases.  No disease in the abdomen and pelvis.  Aortic atherosclerosis was identified.    REVIEW OF SYSTEMS:  Kayla Price has some right upper lung pain.  She notes fatigue.  She has some decreased appetite.  She notes some increased swelling in her right arm.  She sees Dr. Tommy Price next week for her HIV, and her levels have remained undetectable and she is compliant with her medications.  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  She cannot tell me if it is  constant or intermittent. Just that it is 90% improved from when she initially developed it after her initial chemotherapy regimen in 2005.   Kayla Price denies unusual headaches or vision changes.  She is without nausea, vomiting, bowel or bladder changes.  She denies chest pain, palpitations, cough.  A detailed ROS was otherwise non contributory.     BREAST CANCER HISTORY: From the original intake note:  Kayla Price is a history of right-sided breast cancer dating back to 2005. She had a lumpectomy with sentinel lymph node sampling, chemotherapy, and radiation. I do not have access to those records at present.  More recently she had bilateral screening mammography at the [facility] 09/25/2016 showing a possible mass in the right breast. Diagnostic mammography with ultrasonography on 09/28/2016 the patient underwent right diagnostic mammography with tomography and right breast ultrasonography. The breast density was category A. In the right breast at the 10:00 position there was an irregular mass measuring 2.5 cm. Ultrasound identified this the 10:00 radiant 10 cm from the nipple measuring 2.4 cm. In the right axilla there was an abnormal lymph node measuring 1.3 cm with Kayla normal-appearing lymph nodes.  On 10/01/2016 she  underwent biopsy of the right breast mass in question as well as the suspicious axillary lymph node. 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 The proliferation marker was 70% in the breast lesion and 50% in the lymph node.  Her subsequent history is as detailed below.   PAST MEDICAL HISTORY: Past Medical History:  Diagnosis Date  . Alopecia areata 11/28/2009  . Bell's palsy   .  Cancer [facility]) 2005   Breast cancer   chemotherapy and radiation  . CKD (chronic kidney disease) stage 3, GFR 30-59 ml/min (HCC) 06/08/2015  . Dry eye  syndrome   . Family history of lung cancer   . Family history of non-Hodgkin's lymphoma   . Fasting hyperglycemia   . HIP FRACTURE, RIGHT 05/06/2008  . History of kidney stones   . HIV DISEASE 03/27/2006  . HYPERLIPIDEMIA, MIXED 12/15/2007  . HYPERTENSION 03/27/2006  . 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 MENORRHAGIA, POSTMENOPAUSAL 02/03/2009  . Osteoarthritis of left knee 06/08/2015  . OSTEOARTHROSIS, LOCAL, SCND, UNSPC SITE 04/07/2007  . PVD 04/07/2007  . Tinea capitis   . TRIGGER FINGER 05/06/2008  . Unspecified vitamin D deficiency 08/06/2007    PAST SURGICAL HISTORY: Past Surgical History:  Procedure Laterality Date  . BREAST LUMPECTOMY Right    2005  . COLONOSCOPY    . COLONOSCOPY WITH ESOPHAGOGASTRODUODENOSCOPY (EGD)    . HYSTEROSCOPY  2006  . IR THORACENTESIS ASP PLEURAL SPACE W/IMG GUIDE  04/14/2018  . IR THORACENTESIS ASP PLEURAL SPACE W/IMG GUIDE  04/28/2018  . KNEE ARTHROSCOPY Left 2002  . LYMPH NODE DISSECTION  2005  . MASTECTOMY MODIFIED RADICAL Right 11/05/2016   Procedure: RIGHT MASTECTOMY MODIFIED RADICAL;  Surgeon: Kayla Skates, Price;  Location: [place];  Service: General;  Laterality: Right;  . MODIFIED RADICAL MASTECTOMY Right 11/05/2016  . placement   of port-a-cath    . PORT-A-CATH REMOVAL  2006   insertion 2005  . PORT-A-CATH REMOVAL N/A 03/29/2017   Procedure: REMOVAL PORT-A-CATH;  Surgeon: Kayla Skates, Price;  Location: [place];  Service: General;  Laterality: N/A;  . PORTACATH PLACEMENT Right 11/05/2016   Procedure: INSERTION PORT-A-CATH WITH ULTRA SOUND;  Surgeon: Kayla Skates, Price;  Location: [place];  Service: General;  Laterality: Right;  . removal of port a cath  12/2014  . THYROID SURGERY  2009   Ablation   . TOTAL KNEE ARTHROPLASTY Left 02/06/2016   Procedure: TOTAL KNEE ARTHROPLASTY;  Surgeon: Kayla Pear, Price;  Location: [place];  Service: Orthopedics;  Laterality: Left;  . TUBAL LIGATION      FAMILY HISTORY Family History  Problem  Relation Age of Onset  . Heart attack Brother        Massive MI in 2s  . Stroke Brother        CAD  . Kidney disease Mother   . Stroke Mother   . Diabetes Mother   . Liver disease Sister   . COPD Sister        had I-131 rx of hyperthyroidism  . Diabetes Sister   . Stroke Sister   . Lung cancer Paternal Uncle        hx smoking  . Cancer Cousin   . Non-Hodgkin's lymphoma Cousin 25       cancer x3, in prostate and lung- unsure if met/spread or if primaries  . Heart failure Father   . Heart disease Father   . Arthritis Father   . Sarcoidosis Sister   The patient's father died at the age of 57 in the setting of Alzheimer's disease. The patient's mother died at the age of 100 from complications of diabetes. The patient has 3 brothers, 4 sisters. There is no history of breast or ovarian cancer in the family area   GYNECOLOGIC HISTORY:  Patient's last menstrual period was 11/06/2003.  menarche age 40, first live birth age 8, the patient is [place] P2. She stopped having periods in 2005,  with her chemotherapy; she never took hormone replacement   SOCIAL HISTORY:  Kayla Price worked for the [employer] more than 20 years. 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 At home she lives with her son Kayla Price, her daughter Kayla Price who works for [employer] and her granddaughter Kayla Price, 54 y/o AS OF APRIL 2018     ADVANCED DIRECTIVES:  not in place    HEALTH MAINTENANCE: Social History   Tobacco Use  . Smoking status: Never Smoker  . Smokeless tobacco: Never Used  Substance Use Topics  . Alcohol use: No  . Drug use: No     Colonoscopy:  PAP:  Bone density:   Allergies  Allergen Reactions  . Lisinopril Anaphylaxis and Swelling    Swelling of tongue and mouth 11/05/16- tolerates Olmesartan  . Pepcid [Famotidine] Kayla (See Comments)    PPI H2, BLOCKERS LOWER GASTRIC PH WHICH WOULD LEAD TO SUBTHERAPEUTIC RILPIVIRINE LEVELS AND POTENTIAL  VIROLOGICAL FAILURE WITH RESISTANCE  . Prilosec [Omeprazole] Kayla (See Comments)    PPI H2, BLOCKERS LOWER GASTRIC PH WHICH WOULD LEAD TO SUBTHERAPEUTIC RILPIVIRINE LEVELS AND POTENTIAL VIROLOGICAL FAILURE WITH RESISTANCE  . Tums [Calcium Carbonate Antacid] Kayla (See Comments)    TUMS ANTACIDS CAN LOWER GASTRIC PH WHICH COULD  LEAD TO SUBTHERAPEUTIC RILPIVIRINE LEVELS AND POTENTIAL VIROLOGICAL FAILURE WITH RESISTANCE TUMS CAN BE GIVEN BUT NEED CONSULT WITH [facility] PHARMACY RE TIMING. I PREFER HER TO AVOID ALL TOGETHER    Current Outpatient Medications  Medication Sig Dispense Refill  . CALCIUM-VITAMIN D PO Take 1 tablet by mouth as needed.     . Cholecalciferol (VITAMIN D3) 2000 units TABS Take 2,000 Units by mouth daily with lunch.    . darunavir-cobicistat (PREZCOBIX) 800-150 MG tablet TAKE 1 TABLET BY MOUTH DAILY. SWALLOW WHOLE. DO NOT CRUSH, BREAK OR CHEW TABLETS. TAKE WITH FOOD. 30 tablet 5  . Dolutegravir-Rilpivirine (JULUCA) 50-25 MG TABS Take 1 tablet by mouth daily with breakfast. Take with the Prezcobix. 30 tablet 5  . HYDROcodone-acetaminophen (NORCO/VICODIN) 5-325 MG tablet Take one tablet by mouth every 6 hours as needed for moderate pain 60 tablet 0  . levothyroxine (SYNTHROID, LEVOTHROID) 150 MCG tablet Take 1 tablet (150 mcg total) by mouth daily before breakfast. 90 tablet 3  . loratadine (CLARITIN) 10 MG tablet Take 10 mg by mouth as needed for itching. Reported on 06/08/2015    . maraviroc (SELZENTRY) 150 MG tablet Take 1 tablet (150 mg total) by mouth 2 (two) times daily. 60 tablet 11  . Multiple Vitamins-Minerals (HAIR/SKIN/NAILS) TABS Take 1 tablet by mouth daily.    Marland Kitchen olmesartan-hydrochlorothiazide (BENICAR HCT) 40-25 MG tablet TAKE 1 TABLET BY MOUTH DAILY. 30 tablet 1  . Omega-3 Fatty Acids (FISH OIL PO) Take 1 capsule by mouth daily with lunch.    . Pitavastatin Calcium 4 MG TABS Take 1 tablet (4 mg total) by mouth daily. This may be placebo, study provided. Do not dispense.  30 tablet 11  . Probiotic Product (PROBIOTIC PO) Take 1 capsule by mouth daily with lunch.     No current facility-administered medications for this visit.     OBJECTIVE: Vitals:   05/12/18 0952  BP: (!) 150/95  Pulse: 67  Resp: 19  Temp: 97.7 F (36.5 C)  SpO2: 100%     Body mass index is 42.9 kg/m.    Filed Weights   05/12/18 0952  Weight: 273 lb 14.4 oz (124.2 kg)  ECOG FS: 1 GENERAL:  Patient is a well appearing female in no acute distress HEENT:  Sclerae anicteric.  Oropharynx clear and moist. No ulcerations or evidence of oropharyngeal candidiasis. Neck is supple.  NODES:  No cervical, supraclavicular, or axillary lymphadenopathy palpated.  BREAST EXAM:  Right breast s/p mastectomy, no sign of local recurrence, left breast benign LUNGS:  Diminished in right lower lobe.  No wheezes or rhonchi. HEART:  Regular rate and rhythm. No murmur appreciated. ABDOMEN:  Soft, nontender.  Positive, normoactive bowel sounds. No organomegaly palpated. MSK:  No focal spinal tenderness to palpation. Full range of motion bilaterally in the upper extremities. EXTREMITIES:  No peripheral edema.   SKIN:  Clear with no obvious rashes or skin changes. No nail dyscrasia. NEURO:  Nonfocal. Well oriented.  Appropriate affect.      LAB RESULTS:  CMP     Component Value Date/Time   Price 142 04/18/2018 0932   Price 140 05/13/2017 0927   K 3.8 04/18/2018 0932   K 4.1 05/13/2017 0927   CL 103 04/18/2018 0932   CO2 33 (H) 04/18/2018 0932   CO2 29 05/13/2017 0927   GLUCOSE 87 04/18/2018 0932   GLUCOSE 102 05/13/2017 0927   BUN 14 04/18/2018 0932   BUN 17.7 05/13/2017 0927   CREATININE 1.48 (H) 04/18/2018 0932   CREATININE 1.4 (H) 05/13/2017 0927   CALCIUM 9.9 04/18/2018 0932   CALCIUM 10.5 (H) 05/13/2017 0927   PROT 7.6 12/25/2017 1058   PROT 8.1 05/13/2017 0927   ALBUMIN 3.5 05/13/2017 0927   AST 14 12/25/2017 1058   AST 14 05/13/2017 0927   ALT 11 12/25/2017 1058   ALT 12 05/13/2017  0927   ALKPHOS 90 05/13/2017 0927   BILITOT 0.6 12/25/2017 1058   BILITOT 0.27 05/13/2017 0927   GFRNONAA 38 (L) 04/18/2018 0932   GFRAA 44 (L) 04/18/2018 0932    No results found for: Ronnald Ramp, A1GS, A2GS, BETS, BETA2SER, GAMS, MSPIKE, SPEI  No results found for: Nils Pyle, [facility]  Lab Results  Component Value Date   WBC 4.1 04/18/2018   NEUTROABS 2,239 04/18/2018   HGB 15.0 04/18/2018   HCT 44.8 04/18/2018   MCV 97.8 04/18/2018   PLT 216 04/18/2018      Chemistry      Component Value Date/Time   Price 142 04/18/2018 0932   Price 140 05/13/2017 0927   K 3.8 04/18/2018 0932   K 4.1 05/13/2017 0927   CL 103 04/18/2018 0932   CO2 33 (H) 04/18/2018 0932   CO2 29 05/13/2017 0927   BUN 14 04/18/2018 0932   BUN 17.7 05/13/2017 0927   CREATININE 1.48 (H) 04/18/2018 0932   CREATININE 1.4 (H) 05/13/2017 0927   GLU 104 02/13/2016 1358      Component Value Date/Time   CALCIUM 9.9 04/18/2018 0932   CALCIUM 10.5 (H) 05/13/2017 0927   ALKPHOS 90 05/13/2017 0927   AST 14 12/25/2017 1058   AST 14 05/13/2017 0927   ALT 11 12/25/2017 1058   ALT 12 05/13/2017 0927   BILITOT 0.6 12/25/2017 1058   BILITOT 0.27 05/13/2017 0927       Lab Results  Component Value Date   LABCA2 11 04/20/2008    No components found for: [id number]  No results for input(s): INR in the last 168 hours.  Urinalysis    Component Value Date/Time   COLORURINE YELLOW 01/27/2016 1028   APPEARANCEUR CLEAR 01/27/2016 1028   LABSPEC 1.028 01/27/2016 1028   PHURINE 5.5 01/27/2016 1028  GLUCOSEU NEGATIVE 01/27/2016 1028   GLUCOSEU NEG mg/dL 07/23/2006 2113   HGBUR NEGATIVE 01/27/2016 1028   BILIRUBINUR NEGATIVE 01/27/2016 1028   KETONESUR NEGATIVE 01/27/2016 1028   PROTEINUR NEGATIVE 01/27/2016 1028   UROBILINOGEN 1 07/23/2006 2113   NITRITE NEGATIVE 01/27/2016 1028   LEUKOCYTESUR NEGATIVE 01/27/2016 1028     STUDIES:    ELIGIBLE FOR AVAILABLE RESEARCH  PROTOCOL: no  ASSESSMENT: 62 y.o. [place] woman   (1) status post right lumpectomy and sentinel lymph node sampling October 2005 for a 0.6 cm invasive ductal carcinoma involving one out of 2 sentinel lymph nodes sampled, grade 3, triple-negative, treated adjuvantly with doxorubicin and cyclophosphamide 4 followed by weekly paclitaxel 7, followed by adjuvant radiation  RECURRENT DISEASE: (2) status post right breast upper outer quadrant biopsy and right axillary lymph node biopsy 10/01/2016, both positive for a T2 N1, stage IIIB invasive ductal carcinoma, triple negative, with an MIB-1 of 50-70%  (3) status post right modified radical mastectomy 11/05/2016 showing a pT2 pN1, stage IIIB invasive ductal carcinoma, grade 3, triple negative, with negative margins  (4) Not a candidate for radiation given prior history  (5) adjuvant chemotherapy consisting of carboplatin and gemcitabine given days 1 and 8 of each 21 day cycle, for 6 cycles, starting 11/20/2016, completed 03/11/2017  (a) day 8 cycle 2 omitted because of neutropenia; Neupogen/Neulasta added  (5) HIV positivity  (6) Genetic testing 06/17/2017:  no pathogenic mutations. Genes tested: APC, ATM, AXIN2, BARD1, BLM, BMPR1A, BRCA1, BRCA2, BRIP1, CDH1, CDK4, CDKN2A (p14ARF), CDKN2A (p16INK4a), CEBPA, CHEK2, CTNNA1, DICER1, EPCAM*, GATA2, GREM1*, HRAS, KIT, MEN1, MLH1, MSH2, MSH3, MSH6, MUTYH, NBN, NF1, PALB2, PDGFRA, PMS2, POLD1, POLE, PTEN, RAD50, RAD51C, RAD51D, RUNX1, SDHB, SDHC, SDHD, SMAD4, SMARCA4, STK11, TERC, TERT, TP53, TSC1, TSC2, VHL. The following genes were evaluated for sequence changes only: HOXB13*, NTHL1*, SDHA.Marland Kitchen A variant of uncertain significance (VUS) in a gene called NTHL1 was also noted. c.736G>A (p.Ala246Thr)  METASTATIC DISEASE: November 2019  (1) Patient seen in urgent care and ultimately ER on 10/21 for shortness of breath, chest xray demonstrated Right pleural effusion.    (a) right thoracenteses on 10/21 and  11/4 results show atypical cells, non diagnostic  (b) CT chest 04/22/2018 shows re-accumulation of fluid, and right pleural nodularity.  (c) PET scan on 05/01/2018 shows hypermetabolic pleural based metastases, right CP angle nodal metastases, no evidence of malignancy in abdomen and pelvis.  (d) biopsy pending    PLAN:   Kayla Price is doing well today considering she has had an eventful past few weeks.  She is aware of her PET scan results and the concern of the lung.  We reviewed that she needs to have a biopsy of this area of concern in her lung.  I will send a note to Kayla Price and his Nurse Practitioner.    Dr. Jana Hakim met with Kayla Price and he reviewed that he is concerned that her breast cancer has returned to her lung.  He reviewed that we need a biopsy of this tissue with repeat receptors.  She will return in 2 weeks for labs and f/u with him and hopefully we will have those results to review and discuss.    She knows to call for any Kayla issues that may develop before the next visit.  If this proves to be what we think, we can consider paclitaxel/bevacizumab as a possible treatment plan   Kayla Bihari, NP  05/12/18 1:30 PM Medical Oncology and Hematology [facility] [address]  [place], [place] [address] Tel. [phone]    Fax. [phone]    ADDENDUM: Kayla Price looks great clinically, but she has developed significant changes in her right hemithorax which are consistent with pleural if and based metastatic disease.  She has had 2 thoracentesis, both nondiagnostic.  I think were going to have to proceed to biopsy and we are consulting pulmonary with that in mind  She is going to return to see me in approximately 10 days.  Hopefully by then we will have the results.  If this is breast cancer as I expect we will need a repeat prognostic profile because sometimes the tumors change in her cancer now may be HER-2 positive or estrogen receptor positive whereas  before it was triple negative. We will obtain a foundation 1 and PDL 1 from the definitive biopsy as well  Kayla Price has a good understanding of what is going on.  She wants to be proactive and "get on with it as fast as possible".       I personally saw this patient and performed a substantive portion of this encounter with the listed APP documented above.   Kayla Cruel, Price Medical Oncology and Hematology [facility] [address] [place], [place] [address] Tel. [phone]    Fax. [phone]

## 2018-05-12 NOTE — Telephone Encounter (Signed)
Gave patinet avs and calendar.

## 2018-05-14 ENCOUNTER — Inpatient Hospital Stay: Payer: Medicare HMO

## 2018-05-14 ENCOUNTER — Other Ambulatory Visit: Payer: Self-pay | Admitting: Adult Health

## 2018-05-14 ENCOUNTER — Telehealth: Payer: Self-pay

## 2018-05-14 DIAGNOSIS — Z171 Estrogen receptor negative status [ER-]: Principal | ICD-10-CM

## 2018-05-14 DIAGNOSIS — C50411 Malignant neoplasm of upper-outer quadrant of right female breast: Secondary | ICD-10-CM | POA: Diagnosis not present

## 2018-05-14 DIAGNOSIS — Z9221 Personal history of antineoplastic chemotherapy: Secondary | ICD-10-CM | POA: Diagnosis not present

## 2018-05-14 DIAGNOSIS — I10 Essential (primary) hypertension: Secondary | ICD-10-CM | POA: Diagnosis not present

## 2018-05-14 DIAGNOSIS — R918 Other nonspecific abnormal finding of lung field: Secondary | ICD-10-CM | POA: Diagnosis not present

## 2018-05-14 DIAGNOSIS — J90 Pleural effusion, not elsewhere classified: Secondary | ICD-10-CM | POA: Diagnosis not present

## 2018-05-14 DIAGNOSIS — Z9011 Acquired absence of right breast and nipple: Secondary | ICD-10-CM | POA: Diagnosis not present

## 2018-05-14 DIAGNOSIS — E039 Hypothyroidism, unspecified: Secondary | ICD-10-CM | POA: Diagnosis not present

## 2018-05-14 LAB — CMP (CANCER CENTER ONLY)
ALT: 7 U/L (ref 0–44)
AST: 13 U/L — AB (ref 15–41)
Albumin: 3.4 g/dL — ABNORMAL LOW (ref 3.5–5.0)
Alkaline Phosphatase: 63 U/L (ref 38–126)
Anion gap: 8 (ref 5–15)
BILIRUBIN TOTAL: 0.4 mg/dL (ref 0.3–1.2)
BUN: 13 mg/dL (ref 8–23)
CHLORIDE: 101 mmol/L (ref 98–111)
CO2: 31 mmol/L (ref 22–32)
CREATININE: 1.55 mg/dL — AB (ref 0.44–1.00)
Calcium: 10.1 mg/dL (ref 8.9–10.3)
GFR, EST NON AFRICAN AMERICAN: 35 mL/min — AB (ref >60)
GFR, Est AFR Am: 40 mL/min — ABNORMAL LOW (ref >60)
Glucose, Bld: 120 mg/dL — ABNORMAL HIGH (ref 70–99)
POTASSIUM: 3.5 mmol/L (ref 3.5–5.1)
Sodium: 140 mmol/L (ref 135–145)
TOTAL PROTEIN: 7.9 g/dL (ref 6.5–8.1)

## 2018-05-14 LAB — CBC WITH DIFFERENTIAL (CANCER CENTER ONLY)
Abs Immature Granulocytes: 0.01 10*3/uL (ref 0.00–0.07)
BASOS PCT: 1 %
Basophils Absolute: 0 10*3/uL (ref 0.0–0.1)
EOS ABS: 0.2 10*3/uL (ref 0.0–0.5)
EOS PCT: 3 %
HCT: 43.6 % (ref 36.0–46.0)
Hemoglobin: 14.2 g/dL (ref 12.0–15.0)
Immature Granulocytes: 0 %
Lymphocytes Relative: 32 %
Lymphs Abs: 1.7 10*3/uL (ref 0.7–4.0)
MCH: 32.2 pg (ref 26.0–34.0)
MCHC: 32.6 g/dL (ref 30.0–36.0)
MCV: 98.9 fL (ref 80.0–100.0)
MONOS PCT: 9 %
Monocytes Absolute: 0.5 10*3/uL (ref 0.1–1.0)
Neutro Abs: 2.8 10*3/uL (ref 1.7–7.7)
Neutrophils Relative %: 55 %
PLATELETS: 194 10*3/uL (ref 150–400)
RBC: 4.41 MIL/uL (ref 3.87–5.11)
RDW: 13.2 % (ref 11.5–15.5)
WBC: 5.2 10*3/uL (ref 4.0–10.5)
nRBC: 0 % (ref 0.0–0.2)

## 2018-05-14 LAB — PROTIME-INR
INR: 0.94
PROTHROMBIN TIME: 12.5 s (ref 11.4–15.2)

## 2018-05-14 LAB — CEA (IN HOUSE-CHCC): CEA (CHCC-In House): <1 ng/mL (ref 0.00–5.00)

## 2018-05-14 MED FILL — OLMESARTAN-HCTZ 40-25 MG TA: 40-25 | 30 days supply | Qty: 30 | Fill #0

## 2018-05-14 MED FILL — JULUCA 50-25 MG TAB: 50-25 | 30 days supply | Qty: 30 | Fill #1

## 2018-05-14 MED FILL — SELZENTRY 150 MG TABS: 150 | 30 days supply | Qty: 60 | Fill #10

## 2018-05-14 MED FILL — PREZCOBIX 800 MG-150 MG TAB: 800-150 | 30 days supply | Qty: 30 | Fill #1

## 2018-05-14 NOTE — Telephone Encounter (Signed)
Patient returned call and agreed to have labs drawn today.  Appointment made in lab, patient to be here around 12 pm.

## 2018-05-14 NOTE — Telephone Encounter (Signed)
Orders placed. Thanks,   Mendel Ryder

## 2018-05-14 NOTE — Telephone Encounter (Signed)
-----   Message from Gardenia Phlegm, NP sent at 05/14/2018  6:36 AM EST ----- Please call patient and see if she can come in and get labs today prior to her procedure tomorrow.    Thanks,  Mendel Ryder

## 2018-05-14 NOTE — Telephone Encounter (Signed)
LVM for patient in regards to having labs done today before procedure tomorrow.  Asked patient to please call back and let us know.

## 2018-05-15 ENCOUNTER — Ambulatory Visit (HOSPITAL_COMMUNITY)
Admission: RE | Admit: 2018-05-15 | Discharge: 2018-05-15 | Disposition: A | Discharge status: Home / Self Care | Payer: Medicare HMO | Source: Ambulatory Visit | Attending: Emergency Medicine | Admitting: Emergency Medicine

## 2018-05-15 ENCOUNTER — Other Ambulatory Visit: Payer: Self-pay

## 2018-05-15 ENCOUNTER — Encounter (HOSPITAL_COMMUNITY): Admission: RE | Payer: Self-pay | Source: Ambulatory Visit

## 2018-05-15 ENCOUNTER — Ambulatory Visit (HOSPITAL_COMMUNITY): Payer: Medicare HMO | Admitting: Registered Nurse

## 2018-05-15 ENCOUNTER — Encounter (HOSPITAL_COMMUNITY): Payer: Self-pay | Admitting: *Deleted

## 2018-05-15 ENCOUNTER — Encounter (HOSPITAL_COMMUNITY)
Admission: RE | Disposition: A | Discharge status: Home / Self Care | Payer: Self-pay | Source: Ambulatory Visit | Attending: Emergency Medicine

## 2018-05-15 ENCOUNTER — Other Ambulatory Visit: Payer: Self-pay | Admitting: Oncology

## 2018-05-15 DIAGNOSIS — Z87442 Personal history of urinary calculi: Secondary | ICD-10-CM | POA: Diagnosis not present

## 2018-05-15 DIAGNOSIS — J90 Pleural effusion, not elsewhere classified: Secondary | ICD-10-CM | POA: Diagnosis not present

## 2018-05-15 DIAGNOSIS — R898 Other abnormal findings in specimens from other organs, systems and tissues: Secondary | ICD-10-CM | POA: Diagnosis not present

## 2018-05-15 DIAGNOSIS — E039 Hypothyroidism, unspecified: Secondary | ICD-10-CM | POA: Diagnosis not present

## 2018-05-15 DIAGNOSIS — Z79899 Other long term (current) drug therapy: Secondary | ICD-10-CM | POA: Insufficient documentation

## 2018-05-15 DIAGNOSIS — R848 Other abnormal findings in specimens from respiratory organs and thorax: Secondary | ICD-10-CM | POA: Diagnosis not present

## 2018-05-15 DIAGNOSIS — R590 Localized enlarged lymph nodes: Secondary | ICD-10-CM

## 2018-05-15 DIAGNOSIS — R918 Other nonspecific abnormal finding of lung field: Secondary | ICD-10-CM | POA: Diagnosis not present

## 2018-05-15 DIAGNOSIS — Z853 Personal history of malignant neoplasm of breast: Secondary | ICD-10-CM | POA: Diagnosis not present

## 2018-05-15 DIAGNOSIS — C50911 Malignant neoplasm of unspecified site of right female breast: Secondary | ICD-10-CM | POA: Diagnosis not present

## 2018-05-15 DIAGNOSIS — M1990 Unspecified osteoarthritis, unspecified site: Secondary | ICD-10-CM | POA: Insufficient documentation

## 2018-05-15 DIAGNOSIS — I129 Hypertensive chronic kidney disease with stage 1 through stage 4 chronic kidney disease, or unspecified chronic kidney disease: Secondary | ICD-10-CM | POA: Diagnosis not present

## 2018-05-15 DIAGNOSIS — Z006 Encounter for examination for normal comparison and control in clinical research program: Secondary | ICD-10-CM

## 2018-05-15 DIAGNOSIS — E785 Hyperlipidemia, unspecified: Secondary | ICD-10-CM | POA: Insufficient documentation

## 2018-05-15 DIAGNOSIS — C50919 Malignant neoplasm of unspecified site of unspecified female breast: Secondary | ICD-10-CM | POA: Diagnosis present

## 2018-05-15 DIAGNOSIS — N183 Chronic kidney disease, stage 3 (moderate): Secondary | ICD-10-CM | POA: Insufficient documentation

## 2018-05-15 DIAGNOSIS — E782 Mixed hyperlipidemia: Secondary | ICD-10-CM | POA: Diagnosis not present

## 2018-05-15 DIAGNOSIS — B20 Human immunodeficiency virus [HIV] disease: Secondary | ICD-10-CM

## 2018-05-15 HISTORY — PX: ENDOBRONCHIAL ULTRASOUND: SHX5096

## 2018-05-15 HISTORY — PX: FLEXIBLE BRONCHOSCOPY: SHX5094

## 2018-05-15 HISTORY — PX: FINE NEEDLE ASPIRATION BIOPSY: CATH118315

## 2018-05-15 LAB — CANCER ANTIGEN 27.29: CAN 27.29: 14.6 U/mL (ref 0.0–38.6)

## 2018-05-15 SURGERY — ENDOBRONCHIAL ULTRASOUND (EBUS)
Anesthesia: General | Laterality: Bilateral

## 2018-05-15 MED ORDER — MIDAZOLAM HCL 2 MG/2ML IJ SOLN
INTRAMUSCULAR | Status: AC
  Filled 2018-05-15: qty 2

## 2018-05-15 MED ORDER — HYDRALAZINE HCL 20 MG/ML IJ SOLN
INTRAMUSCULAR | Status: DC | PRN
  Administered 2018-05-15: 5 mg via INTRAVENOUS

## 2018-05-15 MED ORDER — FENTANYL CITRATE (PF) 100 MCG/2ML IJ SOLN
INTRAMUSCULAR | Status: AC
  Filled 2018-05-15: qty 2

## 2018-05-15 MED ORDER — DOLUTEGRAVIR-RILPIVIRINE 50-25 MG PO TABS: 1.0000 | ORAL_TABLET | Freq: Every day | ORAL | Status: DC

## 2018-05-15 MED ORDER — ROCURONIUM BROMIDE 10 MG/ML (PF) SYRINGE
PREFILLED_SYRINGE | INTRAVENOUS | Status: DC | PRN
  Administered 2018-05-15: 20 mg via INTRAVENOUS
  Administered 2018-05-15: 10 mg via INTRAVENOUS
  Administered 2018-05-15: 50 mg via INTRAVENOUS

## 2018-05-15 MED ORDER — PITAVASTATIN CALCIUM 4 MG PO TABS: 4.0000 mg | ORAL_TABLET | Freq: Every day | ORAL | Status: DC

## 2018-05-15 MED ORDER — ONDANSETRON HCL 4 MG/2ML IJ SOLN
INTRAMUSCULAR | Status: DC | PRN
  Administered 2018-05-15: 4 mg via INTRAVENOUS

## 2018-05-15 MED ORDER — REMIFENTANIL HCL 1 MG IV SOLR
INTRAVENOUS | Status: AC
  Filled 2018-05-15: qty 1000

## 2018-05-15 MED ORDER — DEXAMETHASONE SODIUM PHOSPHATE 10 MG/ML IJ SOLN
INTRAMUSCULAR | Status: DC | PRN
  Administered 2018-05-15: 10 mg via INTRAVENOUS

## 2018-05-15 MED ORDER — SODIUM CHLORIDE 0.9 % IV SOLN
INTRAVENOUS | Status: DC | PRN
  Administered 2018-05-15: 35 ug/min via INTRAVENOUS

## 2018-05-15 MED ORDER — EPHEDRINE SULFATE-NACL 50-0.9 MG/10ML-% IV SOSY
PREFILLED_SYRINGE | INTRAVENOUS | Status: DC | PRN
  Administered 2018-05-15: 10 mg via INTRAVENOUS

## 2018-05-15 MED ORDER — LIDOCAINE 2% (20 MG/ML) 5 ML SYRINGE
INTRAMUSCULAR | Status: DC | PRN
  Administered 2018-05-15: 50 mg via INTRAVENOUS

## 2018-05-15 MED ORDER — MIDAZOLAM HCL 5 MG/5ML IJ SOLN
INTRAMUSCULAR | Status: DC | PRN
  Administered 2018-05-15: 1 mg via INTRAVENOUS

## 2018-05-15 MED ORDER — REMIFENTANIL HCL 1 MG IV SOLR
INTRAVENOUS | Status: AC
  Filled 2018-05-15: qty 2000

## 2018-05-15 MED ORDER — PROPOFOL 10 MG/ML IV BOLUS
INTRAVENOUS | Status: DC | PRN
  Administered 2018-05-15: 160 mg via INTRAVENOUS

## 2018-05-15 MED ORDER — LACTATED RINGERS IV SOLN
INTRAVENOUS | Status: AC | PRN
  Administered 2018-05-15: 1000 mL via INTRAVENOUS
  Administered 2018-05-15: 12:00:00 via INTRAVENOUS

## 2018-05-15 MED ORDER — DIPHENHYDRAMINE HCL 50 MG/ML IJ SOLN
INTRAMUSCULAR | Status: DC | PRN
  Administered 2018-05-15: 12.5 mg via INTRAVENOUS

## 2018-05-15 MED ORDER — DARUNAVIR-COBICISTAT 800-150 MG PO TABS: 1.0000 | ORAL_TABLET | Freq: Every day | ORAL | Status: DC

## 2018-05-15 MED ORDER — SUGAMMADEX SODIUM 500 MG/5ML IV SOLN
INTRAVENOUS | Status: DC | PRN
  Administered 2018-05-15: 500 mg via INTRAVENOUS

## 2018-05-15 MED ORDER — SODIUM CHLORIDE 0.9 % IV SOLN
INTRAVENOUS | Status: DC | PRN
  Administered 2018-05-15: .5 ug/kg/min via INTRAVENOUS

## 2018-05-15 MED ORDER — FENTANYL CITRATE (PF) 100 MCG/2ML IJ SOLN
INTRAMUSCULAR | Status: DC | PRN
  Administered 2018-05-15: 50 ug via INTRAVENOUS

## 2018-05-15 NOTE — Transfer of Care (Signed)
Immediate Anesthesia Transfer of Care Note  Patient: Kayla Price  Procedure(s) Performed: ENDOBRONCHIAL ULTRASOUND (Bilateral ) FINE NEEDLE ASPIRATION BIOPSY  Patient Location: PACU  Anesthesia Type:General  Level of Consciousness: awake, alert , oriented and patient cooperative  Airway & Oxygen Therapy: Patient Spontanous Breathing and Patient connected to face mask oxygen  Post-op Assessment: Report given to RN, Post -op Vital signs reviewed and stable and Patient moving all extremities X 4  Post vital signs: stable  Last Vitals:  Vitals Value Taken Time  BP 171/142 05/15/2018  1:00 PM  Temp    Pulse 94 05/15/2018  1:03 PM  Resp 16 05/15/2018  1:03 PM  SpO2 90 % 05/15/2018  1:03 PM  Vitals shown include unvalidated device data.  Last Pain:  Vitals:   05/15/18 0949  PainSc: 0-No pain         Complications: No apparent anesthesia complications

## 2018-05-15 NOTE — Discharge Instructions (Signed)
Flexible Bronchoscopy, Care After These instructions give you information on caring for yourself after your procedure. Your doctor may also give you more specific instructions. Call your doctor if you have any problems or questions after your procedure. Follow these instructions at home:  Do not eat or drink anything for 2 hours after your procedure. If you try to eat or drink before the medicine wears off, food or drink could go into your lungs. You could also burn yourself.  After 2 hours have passed and when you can cough and gag normally, you may eat soft food and drink liquids slowly.  The day after the test, you may eat your normal diet.  You may do your normal activities.  Keep all doctor visits. Get help right away if:  You get more and more short of breath.  You get light-headed.  You feel like you are going to pass out (faint).  You have chest pain.  You have new problems that worry you.  You cough up more than a little blood.  You cough up more blood than before.  Please call our office for any questions or concerns.  [phone].  This information is not intended to replace advice given to you by your health care provider. Make sure you discuss any questions you have with your health care provider. Document Released: 04/08/2009 Document Revised: 11/17/2015 Document Reviewed: 02/13/2013 Elsevier Interactive Patient Education  2017 [employer].

## 2018-05-15 NOTE — Anesthesia Preprocedure Evaluation (Addendum)
Anesthesia Evaluation  Patient identified by MRN, date of birth, ID band Patient awake    Reviewed: Allergy & Precautions, NPO status , Patient's Chart, lab work & pertinent test results  History of Anesthesia Complications Negative for: history of anesthetic complications  Airway Mallampati: I   Neck ROM: Full    Dental  (+) Teeth Intact, Dental Advisory Given   Pulmonary neg pulmonary ROS,    breath sounds clear to auscultation       Cardiovascular hypertension, + Peripheral Vascular Disease   Rhythm:Regular Rate:Normal     Neuro/Psych negative neurological ROS  negative psych ROS   GI/Hepatic negative GI ROS, Neg liver ROS,   Endo/Other  Hypothyroidism Morbid obesity  Renal/GU Renal disease     Musculoskeletal  (+) Arthritis ,   Abdominal (+) + obese,   Peds  Hematology  (+) Blood dyscrasia, , HIV,   Anesthesia Other Findings   Reproductive/Obstetrics                             Anesthesia Physical  Anesthesia Plan  ASA: III  Anesthesia Plan: General   Post-op Pain Management:    Induction: Intravenous  PONV Risk Score and Plan: 3 and Ondansetron, Dexamethasone and Diphenhydramine  Airway Management Planned: Oral ETT  Additional Equipment:   Intra-op Plan:   Post-operative Plan: Extubation in OR  Informed Consent: I have reviewed the patients History and Physical, chart, labs and discussed the procedure including the risks, benefits and alternatives for the proposed anesthesia with the patient or authorized representative who has indicated his/her understanding and acceptance.   Dental advisory given  Plan Discussed with: CRNA and Anesthesiologist  Anesthesia Plan Comments:         Anesthesia Quick Evaluation

## 2018-05-15 NOTE — Anesthesia Procedure Notes (Addendum)
Procedure Name: Intubation Date/Time: 05/15/2018 10:38 AM Performed by: Lissa Morales, CRNA Pre-anesthesia Checklist: Patient identified, Emergency Drugs available, Suction available and Patient being monitored Patient Re-evaluated:Patient Re-evaluated prior to induction Oxygen Delivery Method: Circle system utilized Preoxygenation: Pre-oxygenation with 100% oxygen Induction Type: IV induction Ventilation: Mask ventilation without difficulty Laryngoscope Size: Mac and 4 Grade View: Grade II Tube type: Oral Tube size: 9.0 mm Number of attempts: 1 Airway Equipment and Method: Stylet and Oral airway Placement Confirmation: ETT inserted through vocal cords under direct vision,  positive ETCO2 and breath sounds checked- equal and bilateral Secured at: 23 cm Tube secured with: Tape Dental Injury: Teeth and Oropharynx as per pre-operative assessment

## 2018-05-15 NOTE — Anesthesia Postprocedure Evaluation (Signed)
Anesthesia Post Note  Patient: Kayla Price  Procedure(s) Performed: ENDOBRONCHIAL ULTRASOUND (Bilateral ) FINE NEEDLE ASPIRATION BIOPSY     Patient location during evaluation: PACU Anesthesia Type: General Level of consciousness: sedated Pain management: pain level controlled Vital Signs Assessment: post-procedure vital signs reviewed and stable Respiratory status: spontaneous breathing and respiratory function stable Cardiovascular status: stable Postop Assessment: no apparent nausea or vomiting Anesthetic complications: no    Last Vitals:  Vitals:   05/15/18 1300 05/15/18 1305  BP: (!) 171/142 (!) 192/105  Pulse: 91 94  Resp: 14 17  Temp:    SpO2: 92% 93%    Last Pain:  Vitals:   05/15/18 1305  TempSrc:   PainSc: 0-No pain                 Miana Politte DANIEL

## 2018-05-15 NOTE — Op Note (Signed)
[facility] Cardiopulmonary Patient Name: Kayla Price Procedure Date: 05/15/2018 MRN: [id number] Attending MD: Collene Gobble , MD Date of Birth: 12-Jun-1956 CSN: [id number] Age: 62 Admit Type: Inpatient Ethnicity: Not Hispanic or Latino Procedure:            Bronchoscopy Indications:          Multiple pulmonary nodules, Hilar lymphadenopathy of                        the right side Providers:            Collene Gobble, MD, Burtis Junes, RN Referring MD:          Medicines:            General Anesthesia Complications:        No immediate complications Estimated Blood Loss: Estimated blood loss was minimal. Procedure:      Pre-Anesthesia Assessment:      - A History and Physical has been performed. Patient meds and allergies       have been reviewed. The risks and benefits of the procedure and the       sedation options and risks were discussed with the patient. All       questions were answered and informed consent was obtained. Patient       identification and proposed procedure were verified prior to the       procedure by the physician in the pre-procedure area. Mental Status       Examination: alert and oriented. Airway Examination: normal       oropharyngeal airway. Respiratory Examination: clear to auscultation. CV       Examination: normal. ASA Grade Assessment: III - A patient with severe       systemic disease. After reviewing the risks and benefits, the patient       was deemed in satisfactory condition to undergo the procedure. The       anesthesia plan was to use general anesthesia. Immediately prior to       administration of medications, the patient was re-assessed for adequacy       to receive sedatives. The heart rate, respiratory rate, oxygen       saturations, blood pressure, adequacy of pulmonary ventilation, and       response to care were monitored throughout the procedure. The physical       status of the patient was re-assessed after the  procedure.      After obtaining informed consent, the bronchoscope was passed under       direct vision. Throughout the procedure, the patient's blood pressure,       pulse, and oxygen saturations were monitored continuously. the BF-[id number]       ([id number] Olympus EBUS was introduced through the mouth, via the       endotracheal tube (the patient was intubated for the procedure) and       advanced to the tracheobronchial tree. The procedure was accomplished       without difficulty. The patient tolerated the procedure well. Findings:      The endotracheal tube is in good position. The visualized portion of the       trachea is of normal caliber. The carina is sharp. The tracheobronchial       tree was examined to at least the first subsegmental level. Bronchial       mucosa and anatomy are normal; there are no endobronchial  lesions, and       no secretions. The oriface to the RML was fish-mouthed and narrowed, but       the scope would pass. the subsegmental airways looked normal.      Lymph Nodes: Lymph node sizing was performed via endobronchial       ultrasound.      - The 4R (lower paratracheal) node was 30 mm by EBUS.      - The 7 (subcarinal) node was 20 mm by EBUS.      Endobronchial ultrasound-guided transbronchial needle aspiration was       performed using a Wang needle and sent for routine cytology. Specimens       were obtained from the 4R (lower paratracheal) and 7 (subcarinal) lymph       nodes. Four passes per node were performed.      Bronchoalveolar lavage was performed in the RUL apical segment (B1) of       the lung and sent for routine cytology. 60 mL of fluid were instilled.       25 mL were returned. The return was blood-tinged. There were no mucoid       plugs in the return fluid. Impression:      - Multiple pulmonary nodules      - Hilar lymphadenopathy of the right side      - The airway examination was normal.      - Lymph node sizing was performed.      - A  transbronchial needle aspiration was performed. Moderate Sedation:      Performed under general anesthesia Recommendation:      - Await BAL and cytology results. Procedure Code(s):      --- Professional ---      [phone], Bronchoscopy, rigid or flexible, including fluoroscopic guidance,       when performed; with endobronchial ultrasound (EBUS) guided       transtracheal and/or transbronchial sampling (eg,       aspiration[s]/biopsy[ies]), one or two mediastinal and/or hilar lymph       node stations or structures      67341, Bronchoscopy, rigid or flexible, including fluoroscopic guidance,       when performed; with bronchial alveolar lavage      31654, Bronchoscopy, rigid or flexible, including fluoroscopic guidance,       when performed; with transendoscopic endobronchial ultrasound (EBUS)       during bronchoscopic diagnostic or therapeutic intervention(s) for       peripheral lesion(s) (List separately in addition to code for primary       procedure[s]) Diagnosis Code(s):      --- Professional ---      R91.8, Other nonspecific abnormal finding of lung field      R59.0, Localized enlarged lymph nodes      R09.89, Other specified symptoms and signs involving the circulatory and       respiratory systems CPT copyright 2018 American Medical Association. All rights reserved. The codes documented in this report are preliminary and upon coder review may  be revised to meet current compliance requirements. Collene Gobble, MD Collene Gobble, MD 05/15/2018 12:52:17 PM Number of Addenda: 0 Scope In: Scope Out:

## 2018-05-15 NOTE — Interval H&P Note (Signed)
PCCM Interval Note  Ms. Kayla Price presents today for further evaluation of her right exudative pleural effusion, cytology negative but a PET scan that was done 05/02/2018 after her most recent right thoracentesis has shown multiple hypermetabolic pleural-based densities, hypermetabolic right hilar and subcarinal lymphadenopathy, all concerning for metastatic disease.  She presents today for tissue diagnosis via endobronchial ultrasound, bronchoscopy.  Risks and benefits discussed with her in the preoperative area.  All questions answered  Vitals:   05/15/18 0949  BP: (!) 162/88  Resp: 13  Temp: 98 F (36.7 C)  SpO2: 99%  Weight: 123.8 kg  Height: 5\' 7"  (1.702 m)   Gen: Pleasant, obese woman, in no distress,  normal affect  ENT: No lesions,  mouth clear,  oropharynx clear, no postnasal drip  Neck: No JVD, no stridor  Lungs: No use of accessory muscles, decreased right basilar breath sounds without any wheeze or crackles  Cardiovascular: RRR, heart sounds normal, no murmur or gallops, trace peripheral edema  Abdomen: soft and NT, no HSM,  BS normal  Musculoskeletal: No deformities, no cyanosis or clubbing  Neuro: alert, awake, non focal  Skin: Warm, no lesions or rashes

## 2018-05-15 NOTE — Progress Notes (Unsigned)
Dr. Lamonte Sakai perform bronchoscopy and biopsy a right middle lobe lower paratracheal (4R) and a subcarinal (7) lymph node.  Pathology is pending.

## 2018-05-16 ENCOUNTER — Encounter (HOSPITAL_COMMUNITY): Payer: Self-pay | Admitting: Emergency Medicine

## 2018-05-16 ENCOUNTER — Ambulatory Visit: Payer: Medicare HMO | Admitting: Oncology

## 2018-05-19 ENCOUNTER — Ambulatory Visit (INDEPENDENT_AMBULATORY_CARE_PROVIDER_SITE_OTHER): Payer: Medicare HMO | Admitting: Infectious Disease

## 2018-05-19 ENCOUNTER — Ambulatory Visit (HOSPITAL_COMMUNITY): Admission: RE | Admit: 2018-05-19 | Payer: Medicare HMO | Source: Ambulatory Visit | Admitting: Emergency Medicine

## 2018-05-19 ENCOUNTER — Encounter: Payer: Self-pay | Admitting: Infectious Disease

## 2018-05-19 VITALS — Ht 67.0 in | Wt 273.0 lb

## 2018-05-19 DIAGNOSIS — R590 Localized enlarged lymph nodes: Secondary | ICD-10-CM

## 2018-05-19 DIAGNOSIS — Z171 Estrogen receptor negative status [ER-]: Secondary | ICD-10-CM | POA: Diagnosis not present

## 2018-05-19 DIAGNOSIS — J910 Malignant pleural effusion: Secondary | ICD-10-CM | POA: Diagnosis not present

## 2018-05-19 DIAGNOSIS — E890 Postprocedural hypothyroidism: Secondary | ICD-10-CM | POA: Diagnosis not present

## 2018-05-19 DIAGNOSIS — Z79899 Other long term (current) drug therapy: Secondary | ICD-10-CM | POA: Diagnosis not present

## 2018-05-19 DIAGNOSIS — B20 Human immunodeficiency virus [HIV] disease: Secondary | ICD-10-CM | POA: Diagnosis not present

## 2018-05-19 DIAGNOSIS — C50411 Malignant neoplasm of upper-outer quadrant of right female breast: Secondary | ICD-10-CM

## 2018-05-19 NOTE — Progress Notes (Signed)
Chief complaint: followup for HIV, and breast cancer Subjective:    Patient ID: Kayla Price, female    DOB: [birth year], 62 y.o.   MRN: [id number]  HPI  Kayla Price is a 62 y.o. female who is doing superbly well on her  antiviral regimen, of JULUCA and Prezcobix  with undetectable viral load and health cd4 count.   She has been diagnosed with with recurrent  breast cancer and is sp ight breast upper outer quadrant biopsy and right axillary lymph node biopsy 10/01/2016, both positive for a T2 N1, stage IIIB invasive ductal carcinoma, triple negative, with an MIB-1 of 50-70%  She is now status post right modified radical mastectomy 11/05/2016 showing a pT2 pN1, stage IIIB invasive ductal carcinoma, grade 3, triple negative, with negative margins with LN sampling, portacath insertion.   She started gemcitabine and carboplatin in May and  Her CD4 dipped on therapy and she has been on bactrim.   She retains virological suppression   With re to her ARV hx [name] had reviewed prior gentoypes below:   At prior [facility] and I reviewed her  HIV and pulled in ALL of the R mutations noted in Dr Ruffin Frederick notes and we had neglected to mention a 41L which when added to all of her other nutation is essentially takes out all the and NRTI's and led to change from prior regimen of Isentress, Atripla to current one.  HIVdb: Genotypic Resistance Interpretation Algorithm  Date: 27-Oct-2014 17:15:11 UTC   Drug Resistance Interpretation: PR PI Major Resistance Mutations: None PI Minor Resistance Mutations: None Other Mutations: V77I Protease Inhibitors atazanavir/r (ATV/r) Susceptible darunavir/r (DRV/r) Susceptible fosamprenavir/r (FPV/r) Susceptible indinavir/r (IDV/r) Susceptible lopinavir/r (LPV/r) Susceptible nelfinavir (NFV) Susceptible saquinavir/r (SQV/r) Susceptible tipranavir/r (TPV/r) Susceptible PR Comments  Drug Resistance Interpretation: RT NRTI Resistance Mutations: M41L,  D67N, M184V, L210W, T215Y NNRTI Resistance Mutations: None Other Mutations: E44D, V118I Nucleoside RTI lamivudine (3TC) High-level resistance abacavir (ABC) High-level resistance zidovudine (AZT) High-level resistance stavudine (D4T) High-level resistance didanosine (DDI) High-level resistance emtricitabine (FTC) High-level resistance tenofovir (TDF) High-level resistance  Non-Nucleoside RTI efavirenz (EFV) Susceptible etravirine (ETR) Susceptible nevirapine (NVP) Susceptible rilpivirine (RPV) Susceptible   She has completed chemotherapy and her CD4 is back up   I last saw [name] she developed difficulty breathing and was found to have significant pleural effusion along with hila lymphadenopath.  He had a nuclear medicine scan which showed hypermetabolic activity within the pleura.  She underwent a thoracentesis showed hazy fluid approximately 2 L according the patient with an LDH of 423 white blood cells of 1750 with predominantly lymphocytes at 63%.  Cytology on pleural fluid was negative for malignancy she underwent a bronchoscopy with transbronchial biopsy and sampling from bronchial of the lavage none of the above revealed malignancy though obviously there is high concern for.  She is to go see Dr. Jana Hakim shortly.  Lab Results  Component Value Date   HIV1RNAQUANT <20 NOT DETECTED 12/25/2017   HIV1RNAQUANT 30 (H) 01/23/2017   HIV1RNAQUANT <20 NOT DETECTED 11/15/2016     Lab Results  Component Value Date   CD4TABS 690 12/25/2017   CD4TABS See Separate Report 05/13/2017   CD4TABS 400 05/13/2017       Past Medical History:  Diagnosis Date  . Alopecia areata 11/28/2009  . Bell's palsy   . Cancer [facility]) 2005   Breast cancer   chemotherapy and radiation  . CKD (chronic kidney disease) stage 3, GFR 30-59 ml/min (HCC) 06/08/2015  . Dry eye syndrome   .  Family history of lung cancer   . Family history of non-Hodgkin's lymphoma   . Fasting hyperglycemia   . HIP FRACTURE,  RIGHT 05/06/2008  . History of kidney stones   . HIV DISEASE 03/27/2006  . HYPERLIPIDEMIA, MIXED 12/15/2007  . HYPERTENSION 03/27/2006  . 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 MENORRHAGIA, POSTMENOPAUSAL 02/03/2009  . Osteoarthritis of left knee 06/08/2015  . OSTEOARTHROSIS, LOCAL, SCND, UNSPC SITE 04/07/2007  . PVD 04/07/2007  . Tinea capitis   . TRIGGER FINGER 05/06/2008  . Unspecified vitamin D deficiency 08/06/2007    Past Surgical History:  Procedure Laterality Date  . BREAST LUMPECTOMY Right    2005  . COLONOSCOPY    . COLONOSCOPY WITH ESOPHAGOGASTRODUODENOSCOPY (EGD)    . ENDOBRONCHIAL ULTRASOUND Bilateral 05/15/2018   Procedure: ENDOBRONCHIAL ULTRASOUND;  Surgeon: Collene Gobble, MD;  Location: [facility];  Service: Cardiopulmonary;  Laterality: Bilateral;  . FINE NEEDLE ASPIRATION BIOPSY  05/15/2018   Procedure: FINE NEEDLE ASPIRATION BIOPSY;  Surgeon: Collene Gobble, MD;  Location: [facility];  Service: Cardiopulmonary;;  . FLEXIBLE BRONCHOSCOPY  05/15/2018   Procedure: FLEXIBLE BRONCHOSCOPY;  Surgeon: Collene Gobble, MD;  Location: [facility];  Service: Cardiopulmonary;;  . HYSTEROSCOPY  2006  . IR THORACENTESIS ASP PLEURAL SPACE W/IMG GUIDE  04/14/2018  . IR THORACENTESIS ASP PLEURAL SPACE W/IMG GUIDE  04/28/2018  . KNEE ARTHROSCOPY Left 2002  . LYMPH NODE DISSECTION  2005  . MASTECTOMY MODIFIED RADICAL Right 11/05/2016   Procedure: RIGHT MASTECTOMY MODIFIED RADICAL;  Surgeon: Fanny Skates, MD;  Location: [place];  Service: General;  Laterality: Right;  . MODIFIED RADICAL MASTECTOMY Right 11/05/2016  . placement   of port-a-cath    . PORT-A-CATH REMOVAL  2006   insertion 2005  . PORT-A-CATH REMOVAL N/A 03/29/2017   Procedure: REMOVAL PORT-A-CATH;  Surgeon: Fanny Skates, MD;  Location: [place];  Service: General;  Laterality: N/A;  . PORTACATH PLACEMENT Right 11/05/2016   Procedure: INSERTION PORT-A-CATH WITH ULTRA SOUND;  Surgeon: Fanny Skates, MD;   Location: [place];  Service: General;  Laterality: Right;  . removal of port a cath  12/2014  . THYROID SURGERY  2009   Ablation   . TOTAL KNEE ARTHROPLASTY Left 02/06/2016   Procedure: TOTAL KNEE ARTHROPLASTY;  Surgeon: Frederik Pear, MD;  Location: [place];  Service: Orthopedics;  Laterality: Left;  . TUBAL LIGATION      Family History  Problem Relation Age of Onset  . Heart attack Brother        Massive MI in 75s  . Stroke Brother        CAD  . Kidney disease Mother   . Stroke Mother   . Diabetes Mother   . Liver disease Sister   . COPD Sister        had I-131 rx of hyperthyroidism  . Diabetes Sister   . Stroke Sister   . Lung cancer Paternal Uncle        hx smoking  . Cancer Cousin   . Non-Hodgkin's lymphoma Cousin 25       cancer x3, in prostate and lung- unsure if met/spread or if primaries  . Heart failure Father   . Heart disease Father   . Arthritis Father   . Sarcoidosis Sister       Social History   Socioeconomic History  . Marital status: Widowed    Spouse name: Not on file  . Number of children: Not on file  . Years of education: Not on  file  . Highest education level: Not on file  Occupational History  . Occupation: Paediatric nurse: UNEMPLOYED  Social Needs  . Financial resource strain: Not hard at all  . Food insecurity:    Worry: Never true    Inability: Never true  . Transportation needs:    Medical: No    Non-medical: No  Tobacco Use  . Smoking status: Never Smoker  . Smokeless tobacco: Never Used  Substance and Sexual Activity  . Alcohol use: No  . Drug use: No  . Sexual activity: Not Currently    Birth control/protection: Post-menopausal    Comment: declined condoms  Lifestyle  . Physical activity:    Days per week: 3 days    Minutes per session: 120 min  . Stress: Not at all  Relationships  . Social connections:    Talks on phone: More than three times a week    Gets together: More than three times a week    Attends  religious service: More than 4 times per year    Active member of club or organization: No    Attends meetings of clubs or organizations: Never    Relationship status: Widowed  Other Topics Concern  . Not on file  Social History Narrative   Single/widow   Diet: good   Do you drink/eat things with caffeine? Tea occasionally   Marital status: Widowed  What year were you married? 1993   Do you live in a house, apartment,assisted living, condo,trailer,ect.)? House   Is it one or more stories? Two   How many persons live in your home? 4   Do you have any pets in you home? No   Current or past profession: Tour manager   Do you exercise? Yes  Type&how often: Stationary Bike, Water Exercise 3-4 x week   Do you have a living will? Yes   Do you have a DNR form? No  If not do you what one?   Do you have signed POA/HPOA forms? No   If so, please bring to your appointment.                Allergies  Allergen Reactions  . Lisinopril Anaphylaxis and Swelling    Swelling of tongue and mouth 11/05/16- tolerates Olmesartan  . Pepcid [Famotidine] Other (See Comments)    PPI H2, BLOCKERS LOWER GASTRIC PH WHICH WOULD LEAD TO SUBTHERAPEUTIC RILPIVIRINE LEVELS AND POTENTIAL VIROLOGICAL FAILURE WITH RESISTANCE  . Prilosec [Omeprazole] Other (See Comments)    PPI H2, BLOCKERS LOWER GASTRIC PH WHICH WOULD LEAD TO SUBTHERAPEUTIC RILPIVIRINE LEVELS AND POTENTIAL VIROLOGICAL FAILURE WITH RESISTANCE  . Tums [Calcium Carbonate Antacid] Other (See Comments)    TUMS ANTACIDS CAN LOWER GASTRIC PH WHICH COULD  LEAD TO SUBTHERAPEUTIC RILPIVIRINE LEVELS AND POTENTIAL VIROLOGICAL FAILURE WITH RESISTANCE TUMS CAN BE GIVEN BUT NEED CONSULT WITH [facility] PHARMACY RE TIMING. I PREFER HER TO AVOID ALL TOGETHER     Current Outpatient Medications:  .  co-enzyme Q-10 30 MG capsule, Take 30 mg by mouth 3 (three) times a week., Disp: , Rfl:  .  darunavir-cobicistat (PREZCOBIX) 800-150 MG tablet, Take 1 tablet by mouth at  bedtime. SWALLOW WHOLE. DO NOT CRUSH, BREAK OR CHEW TABLETS. TAKE WITH FOOD., Disp: , Rfl:  .  Dolutegravir-Rilpivirine (JULUCA) 50-25 MG TABS, Take 1 tablet by mouth at bedtime. Take with the Prezcobix., Disp: , Rfl:  .  HYDROcodone-acetaminophen (NORCO/VICODIN) 5-325 MG tablet, Take one tablet by mouth every 6  hours as needed for moderate pain, Disp: 60 tablet, Rfl: 0 .  hydrocortisone cream 1 %, Apply 1 application topically daily as needed for itching., Disp: , Rfl:  .  levothyroxine (SYNTHROID, LEVOTHROID) 150 MCG tablet, Take 1 tablet (150 mcg total) by mouth daily before breakfast., Disp: 90 tablet, Rfl: 3 .  loratadine (CLARITIN) 10 MG tablet, Take 10 mg by mouth daily as needed for allergies. , Disp: , Rfl:  .  maraviroc (SELZENTRY) 150 MG tablet, Take 1 tablet (150 mg total) by mouth 2 (two) times daily., Disp: 60 tablet, Rfl: 11 .  Methylsulfonylmethane (MSM) 1000 MG CAPS, Take 1,000 mg by mouth 3 (three) times a week., Disp: , Rfl:  .  Naphazoline-Glycerin (REDNESS RELIEF OP), Place 1 drop into both eyes daily as needed (redness)., Disp: , Rfl:  .  olmesartan-hydrochlorothiazide (BENICAR HCT) 40-25 MG tablet, TAKE 1 TABLET BY MOUTH DAILY., Disp: 30 tablet, Rfl: 1 .  Omega-3 Fatty Acids (FISH OIL) 1000 MG CAPS, Take 1,000 mg by mouth 3 (three) times a week., Disp: , Rfl:  .  Pitavastatin Calcium 4 MG TABS, Take 1 tablet (4 mg total) by mouth daily at 12 noon. This may be placebo, study provided. Do not dispense., Disp: , Rfl:  .  potassium gluconate 595 (99 K) MG TABS tablet, Take 595 mg by mouth 3 (three) times a week., Disp: , Rfl:  .  vitamin E 400 UNIT capsule, Take 400 Units by mouth 3 (three) times a week., Disp: , Rfl:    Review of Systems  Constitutional: Negative for activity change, appetite change, chills, diaphoresis, fatigue, fever and unexpected weight change.  HENT: Negative for congestion, postnasal drip, rhinorrhea, sinus pressure, sneezing, sore throat and trouble  swallowing.   Eyes: Negative for photophobia and visual disturbance.  Respiratory: Positive for shortness of breath. Negative for cough, chest tightness, wheezing and stridor.   Cardiovascular: Negative for chest pain, palpitations and leg swelling.  Gastrointestinal: Negative for abdominal distention, abdominal pain, anal bleeding, blood in stool, constipation, diarrhea, nausea and vomiting.  Genitourinary: Negative for difficulty urinating, dysuria, flank pain and hematuria.  Musculoskeletal: Negative for back pain, gait problem, joint swelling and myalgias.  Skin: Negative for pallor and rash.  Neurological: Negative for dizziness, tremors, weakness and light-headedness.  Hematological: Negative for adenopathy. Does not bruise/bleed easily.  Psychiatric/Behavioral: Negative for agitation, behavioral problems, confusion, decreased concentration, dysphoric mood and sleep disturbance. The patient is nervous/anxious.        Objective:   Physical Exam  Constitutional: She is oriented to person, place, and time. She appears well-developed and well-nourished. No distress.  HENT:  Head: Normocephalic and atraumatic.  Mouth/Throat: Oropharynx is clear and moist. No oropharyngeal exudate.  Eyes: Pupils are equal, round, and reactive to light. Conjunctivae and EOM are normal. No scleral icterus.  Neck: Normal range of motion. Neck supple. No JVD present.  Cardiovascular: Normal rate and regular rhythm.  Pulmonary/Chest: Effort normal. No respiratory distress. She has no wheezes.  Abdominal: Soft. She exhibits no distension.  Lymphadenopathy:    She has no cervical adenopathy.  Neurological: She is alert and oriented to person, place, and time. She exhibits normal muscle tone. Coordination normal.  Skin: Skin is warm and dry. She is not diaphoretic. No erythema. No pallor.  Psychiatric: Her behavior is normal. Judgment and thought content normal. Her mood appears anxious.  Vitals  reviewed.  Her mastectomy scar is well healed. Porta-cath site is clean       Assessment &  Plan:   HIV DISEASE :  Continue Prezcobix and JULUCA WITH FOOD AN AVOIDING PPI, H2 blockers  Also been on maraviroc with the idea of it preventing potentially recurrence of malignancy.  I am not confident that we have been able to do that though based on the recent PET scan    Recurrent breast cancer sp chemotherapy: By appearance of PET scan yes cytology and biopsy from transbronchial and bronchoalveolar lavage were negative but this might be a sampling issue.  Query whether or not she would need a video-assisted thoracoscopic surgery to make sure she does or does not have recurrence of her cancer.   Greatly appreciate Dr. Jana Hakim, [facility] Batman taking care of [facility]    Morbid obesity: to try to cut calories, carbohydrates  and to followup with Dr. Dewaine Oats  HYPOTHYROIDISM, POST-RADIATION  Followed by Dr. Loanne Drilling,.  Dose was reduced down.  Hyperlipidemia: Lipid Panel     Component Value Date/Time   CHOL 199 12/25/2017 1058   CHOL 191 10/15/2014 1007   TRIG 115 12/25/2017 1058   HDL 60 12/25/2017 1058   HDL 65 10/15/2014 1007   CHOLHDL 3.3 12/25/2017 1058   VLDL 15 11/15/2016 1104   LDLCALC 116 (H) 12/25/2017 1058      CKD:  Lab Results  Component Value Date   CREATININE 1.55 (H) 05/14/2018   CREATININE 1.48 (H) 04/18/2018   CREATININE 1.64 (H) 04/14/2018   I spent greater than 25 minutes with the patient including greater than 50% of time in face to face counsel of the patient findings on pathology from thoracentesis and transbronchial biopsy bronchoalveolar lavage, nuclear medicine scan findings viral load and CD4 count and in coordination of her care.

## 2018-05-20 LAB — T-HELPER CELL (CD4) - (RCID CLINIC ONLY)
CD4 % Helper T Cell: 35 % (ref 33–55)
CD4 T Cell Abs: 900 /uL (ref 400–2700)

## 2018-05-21 ENCOUNTER — Telehealth: Payer: Self-pay | Admitting: Emergency Medicine

## 2018-05-21 NOTE — Telephone Encounter (Signed)
Attempted to contact pt. Call went straight to voicemail. I have left a message for pt to return our call.

## 2018-05-21 NOTE — Telephone Encounter (Signed)
Please call the patient an let her know that the cytology results collected from her central lymph nodes are all negative for cancer cell. This is good news, but based on the suspicious  Lesions that we have identified on her CT chest, she may need other testing to investigate further.   She needs to keep her visit with Oncology on 12/2, make an appt to see me soon after. We will coordinate a plan with Dr Jana Hakim

## 2018-05-24 LAB — RPR: RPR: NONREACTIVE

## 2018-05-24 LAB — HIV-1 RNA QUANT-NO REFLEX-BLD
HIV 1 RNA Quant: <20 copies/mL — AB
HIV-1 RNA QUANT, LOG: DETECTED {Log_copies}/mL — AB

## 2018-05-26 ENCOUNTER — Inpatient Hospital Stay: Payer: Medicare HMO | Attending: Adult Health

## 2018-05-26 ENCOUNTER — Inpatient Hospital Stay (HOSPITAL_BASED_OUTPATIENT_CLINIC_OR_DEPARTMENT_OTHER): Payer: Medicare HMO | Admitting: Adult Health

## 2018-05-26 ENCOUNTER — Other Ambulatory Visit: Payer: Self-pay | Admitting: Adult Health

## 2018-05-26 ENCOUNTER — Encounter: Payer: Self-pay | Admitting: Adult Health

## 2018-05-26 ENCOUNTER — Telehealth: Payer: Self-pay | Admitting: Oncology

## 2018-05-26 ENCOUNTER — Telehealth: Payer: Self-pay

## 2018-05-26 VITALS — BP 137/97 | HR 71 | Temp 97.8°F | Resp 18 | Ht 67.0 in | Wt 269.8 lb

## 2018-05-26 DIAGNOSIS — C50411 Malignant neoplasm of upper-outer quadrant of right female breast: Secondary | ICD-10-CM | POA: Diagnosis not present

## 2018-05-26 DIAGNOSIS — Z171 Estrogen receptor negative status [ER-]: Secondary | ICD-10-CM

## 2018-05-26 DIAGNOSIS — Z79899 Other long term (current) drug therapy: Secondary | ICD-10-CM | POA: Diagnosis not present

## 2018-05-26 DIAGNOSIS — B20 Human immunodeficiency virus [HIV] disease: Secondary | ICD-10-CM | POA: Diagnosis not present

## 2018-05-26 DIAGNOSIS — Z923 Personal history of irradiation: Secondary | ICD-10-CM | POA: Diagnosis not present

## 2018-05-26 DIAGNOSIS — C50911 Malignant neoplasm of unspecified site of right female breast: Secondary | ICD-10-CM

## 2018-05-26 LAB — CMP (CANCER CENTER ONLY)
ALBUMIN: 3.7 g/dL (ref 3.5–5.0)
ALT: 10 U/L (ref 0–44)
ANION GAP: 12 (ref 5–15)
AST: 13 U/L — ABNORMAL LOW (ref 15–41)
Alkaline Phosphatase: 74 U/L (ref 38–126)
BUN: 17 mg/dL (ref 8–23)
CO2: 29 mmol/L (ref 22–32)
Calcium: 10.8 mg/dL — ABNORMAL HIGH (ref 8.9–10.3)
Chloride: 103 mmol/L (ref 98–111)
Creatinine: 1.58 mg/dL — ABNORMAL HIGH (ref 0.44–1.00)
GFR, Est AFR Am: 40 mL/min — ABNORMAL LOW (ref >60)
GFR, Estimated: 35 mL/min — ABNORMAL LOW (ref >60)
GLUCOSE: 97 mg/dL (ref 70–99)
POTASSIUM: 4 mmol/L (ref 3.5–5.1)
SODIUM: 144 mmol/L (ref 135–145)
Total Bilirubin: 0.6 mg/dL (ref 0.3–1.2)
Total Protein: 8.4 g/dL — ABNORMAL HIGH (ref 6.5–8.1)

## 2018-05-26 LAB — CBC WITH DIFFERENTIAL (CANCER CENTER ONLY)
ABS IMMATURE GRANULOCYTES: 0.02 10*3/uL (ref 0.00–0.07)
BASOS ABS: 0 10*3/uL (ref 0.0–0.1)
BASOS PCT: 1 %
Eosinophils Absolute: 0.2 10*3/uL (ref 0.0–0.5)
Eosinophils Relative: 3 %
HEMATOCRIT: 45.9 % (ref 36.0–46.0)
Hemoglobin: 15.2 g/dL — ABNORMAL HIGH (ref 12.0–15.0)
Immature Granulocytes: 0 %
LYMPHS ABS: 1.4 10*3/uL (ref 0.7–4.0)
Lymphocytes Relative: 29 %
MCH: 32.1 pg (ref 26.0–34.0)
MCHC: 33.1 g/dL (ref 30.0–36.0)
MCV: 97 fL (ref 80.0–100.0)
MONOS PCT: 11 %
Monocytes Absolute: 0.5 10*3/uL (ref 0.1–1.0)
NEUTROS PCT: 56 %
NRBC: 0 % (ref 0.0–0.2)
Neutro Abs: 2.8 10*3/uL (ref 1.7–7.7)
PLATELETS: 178 10*3/uL (ref 150–400)
RBC: 4.73 MIL/uL (ref 3.87–5.11)
RDW: 13.3 % (ref 11.5–15.5)
WBC Count: 4.9 10*3/uL (ref 4.0–10.5)

## 2018-05-26 NOTE — Telephone Encounter (Signed)
Call made to DR. Lanelle Bal office for patient.  Referral is in Epic.  Nurse spoke to [place] in referrals to ensure they received this.  Jenny Reichmann acknowledged referral and will be calling patient.

## 2018-05-26 NOTE — Telephone Encounter (Signed)
Patient decline avs and calendar °

## 2018-05-26 NOTE — Telephone Encounter (Signed)
Attempted to contact pt. Call went straight to voicemail. I have left a message for pt to return our call.

## 2018-05-26 NOTE — Progress Notes (Addendum)
cancer or not.  She is going to need [place]  [phone] [phone]     ID: EVANN KOELZER DOB: 1960/10/04  MR#: [id number]  LNL#:[id number]  Patient Care Team: Lauree Chandler, NP as PCP - General (Nurse Practitioner) Tommy Medal, Lavell Islam, MD as PCP - Infectious Diseases (Infectious Diseases) Clent Jacks, MD as Consulting Physician (Ophthalmology) Renato Shin, MD as Consulting Physician (Endocrinology) Frederik Pear, MD as Consulting Physician (Orthopedic Surgery) Magrinat, Virgie Dad, MD as Consulting Physician (Oncology) Fanny Skates, MD as Consulting Physician (General Surgery) Donnamae Jude, MD as Consulting Physician (Obstetrics and Gynecology) Delice Bison, Charlestine Massed, NP as Nurse Practitioner (Hematology and Oncology) OTHER MD:  CHIEF COMPLAINT: Triple negative breast cancer, recurrent  CURRENT TREATMENT: Observation  INTERVAL HISTORY: [place] returns today for follow-up and treatment of her estrogen receptor negative breast cancer.  Since her last visit here, she underwent bronchoscopy with biopsy, and BAL on 05/15/2018.  No malignant cells were identified.   REVIEW OF SYSTEMS: Peta is doing moderately well today.  She denies any increase in shortness of breath.  She has some right shoulder pain, that feels like a deep ache located mainly in her trapezius muscle.  This is intermittent.  She is taking Norco for her pain when needed.  This is prescribed by her PCP.  She is taking this infrequently.   She notes an odd sound when lying on her back and taking deep breaths, almost as if she can hear herself breathing.  She does have a decreased appetite, but is eating regularly.  Her weight is a few pounds lighter.  She continues to exercise.  She tells me that she had a good thanksgiving, but she thinks that she may need to cook for next thanksgiving.  She denies any other issues today and a detailed ROS was otherwise non  contributory.   BREAST CANCER HISTORY: From the original intake note:  Cledith is a history of right-sided breast cancer dating back to 2005. She had a lumpectomy with sentinel lymph node sampling, chemotherapy, and radiation. I do not have access to those records at present.  More recently she had bilateral screening mammography at the [facility] 09/25/2016 showing a possible mass in the right breast. Diagnostic mammography with ultrasonography on 09/28/2016 the patient underwent right diagnostic mammography with tomography and right breast ultrasonography. The breast density was category A. In the right breast at the 10:00 position there was an irregular mass measuring 2.5 cm. Ultrasound identified this the 10:00 radiant 10 cm from the nipple measuring 2.4 cm. In the right axilla there was an abnormal lymph node measuring 1.3 cm with other normal-appearing lymph nodes.  On 10/01/2016 she  underwent biopsy of the right breast mass in question as well as the suspicious axillary lymph node. 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 The proliferation marker was 70% in the breast lesion and 50% in the lymph node.  Her subsequent history is as detailed below.   PAST MEDICAL HISTORY: Past Medical History:  Diagnosis Date  . Alopecia areata 11/28/2009  . Bell's palsy   . Cancer [facility]) 2005   Breast cancer   chemotherapy and radiation  . CKD (chronic kidney disease) stage 3, GFR 30-59 ml/min (HCC) 06/08/2015  . Dry eye syndrome   . Family history of lung cancer   . Family history of non-Hodgkin's lymphoma   .  Fasting hyperglycemia   . HIP FRACTURE, RIGHT 05/06/2008  . History of kidney stones   . HIV DISEASE 03/27/2006  . HYPERLIPIDEMIA, MIXED 12/15/2007  . HYPERTENSION 03/27/2006  . 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 MENORRHAGIA,  POSTMENOPAUSAL 02/03/2009  . Osteoarthritis of left knee 06/08/2015  . OSTEOARTHROSIS, LOCAL, SCND, UNSPC SITE 04/07/2007  . PVD 04/07/2007  . Tinea capitis   . TRIGGER FINGER 05/06/2008  . Unspecified vitamin D deficiency 08/06/2007    PAST SURGICAL HISTORY: Past Surgical History:  Procedure Laterality Date  . BREAST LUMPECTOMY Right    2005  . COLONOSCOPY    . COLONOSCOPY WITH ESOPHAGOGASTRODUODENOSCOPY (EGD)    . ENDOBRONCHIAL ULTRASOUND Bilateral 05/15/2018   Procedure: ENDOBRONCHIAL ULTRASOUND;  Surgeon: Collene Gobble, MD;  Location: [facility];  Service: Cardiopulmonary;  Laterality: Bilateral;  . FINE NEEDLE ASPIRATION BIOPSY  05/15/2018   Procedure: FINE NEEDLE ASPIRATION BIOPSY;  Surgeon: Collene Gobble, MD;  Location: [facility];  Service: Cardiopulmonary;;  . FLEXIBLE BRONCHOSCOPY  05/15/2018   Procedure: FLEXIBLE BRONCHOSCOPY;  Surgeon: Collene Gobble, MD;  Location: [facility];  Service: Cardiopulmonary;;  . HYSTEROSCOPY  2006  . IR THORACENTESIS ASP PLEURAL SPACE W/IMG GUIDE  04/14/2018  . IR THORACENTESIS ASP PLEURAL SPACE W/IMG GUIDE  04/28/2018  . KNEE ARTHROSCOPY Left 2002  . LYMPH NODE DISSECTION  2005  . MASTECTOMY MODIFIED RADICAL Right 11/05/2016   Procedure: RIGHT MASTECTOMY MODIFIED RADICAL;  Surgeon: Fanny Skates, MD;  Location: [facility];  Service: General;  Laterality: Right;  . MODIFIED RADICAL MASTECTOMY Right 11/05/2016  . placement   of port-a-cath    . PORT-A-CATH REMOVAL  2006   insertion 2005  . PORT-A-CATH REMOVAL N/A 03/29/2017   Procedure: REMOVAL PORT-A-CATH;  Surgeon: Fanny Skates, MD;  Location: [facility];  Service: General;  Laterality: N/A;  . PORTACATH PLACEMENT Right 11/05/2016   Procedure: INSERTION PORT-A-CATH WITH ULTRA SOUND;  Surgeon: Fanny Skates, MD;  Location: [place];  Service: General;  Laterality: Right;  . removal of port a cath  12/2014  . THYROID SURGERY  2009   Ablation   . TOTAL KNEE ARTHROPLASTY Left 02/06/2016    Procedure: TOTAL KNEE ARTHROPLASTY;  Surgeon: Frederik Pear, MD;  Location: [facility];  Service: Orthopedics;  Laterality: Left;  . TUBAL LIGATION      FAMILY HISTORY Family History  Problem Relation Age of Onset  . Heart attack Brother        Massive MI in 63s  . Stroke Brother        CAD  . Kidney disease Mother   . Stroke Mother   . Diabetes Mother   . Liver disease Sister   . COPD Sister        had I-131 rx of hyperthyroidism  . Diabetes Sister   . Stroke Sister   . Lung cancer Paternal Uncle        hx smoking  . Cancer Cousin   . Non-Hodgkin's lymphoma Cousin 25       cancer x3, in prostate and lung- unsure if met/spread or if primaries  . Heart failure Father   . Heart disease Father   . Arthritis Father   . Sarcoidosis Sister   The patient's father died at the age of 18 in the setting of Alzheimer's disease. The patient's mother died at the age of 66 from complications of diabetes. The patient has 3 brothers, 4 sisters. There is no history of breast or ovarian cancer in the family area  GYNECOLOGIC HISTORY:  Patient's last menstrual period was 11/06/2003.  menarche age 75, first live birth age 4, the patient is [place] P2. She stopped having periods in 2005, with her chemotherapy; she never took hormone replacement   SOCIAL HISTORY:  Geneva worked for the [employer] more than 20 years. 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 At home she lives with her son Forestine Na, her daughter [name] who works for [employer] and her granddaughter Donita Brooks, 15 y/o AS OF APRIL 2018     ADVANCED DIRECTIVES:  not in place    HEALTH MAINTENANCE: Social History   Tobacco Use  . Smoking status: Never Smoker  . Smokeless tobacco: Never Used  Substance Use Topics  . Alcohol use: No  . Drug use: No     Colonoscopy:  PAP:  Bone density:   Allergies  Allergen Reactions  . Lisinopril Anaphylaxis and Swelling    Swelling of tongue and  mouth 11/05/16- tolerates Olmesartan  . Pepcid [Famotidine] Other (See Comments)    PPI H2, BLOCKERS LOWER GASTRIC PH WHICH WOULD LEAD TO SUBTHERAPEUTIC RILPIVIRINE LEVELS AND POTENTIAL VIROLOGICAL FAILURE WITH RESISTANCE  . Prilosec [Omeprazole] Other (See Comments)    PPI H2, BLOCKERS LOWER GASTRIC PH WHICH WOULD LEAD TO SUBTHERAPEUTIC RILPIVIRINE LEVELS AND POTENTIAL VIROLOGICAL FAILURE WITH RESISTANCE  . Tums [Calcium Carbonate Antacid] Other (See Comments)    TUMS ANTACIDS CAN LOWER GASTRIC PH WHICH COULD  LEAD TO SUBTHERAPEUTIC RILPIVIRINE LEVELS AND POTENTIAL VIROLOGICAL FAILURE WITH RESISTANCE TUMS CAN BE GIVEN BUT NEED CONSULT WITH [facility] PHARM[facility]Y RE TIMING. I PREFER HER TO AVOID ALL TOGETHER    Current Outpatient Medications  Medication Sig Dispense Refill  . co-enzyme Q-10 30 MG capsule Take 30 mg by mouth 3 (three) times a week.    . darunavir-cobicistat (PREZCOBIX) 800-150 MG tablet Take 1 tablet by mouth at bedtime. SWALLOW WHOLE. DO NOT CRUSH, BREAK OR CHEW TABLETS. TAKE WITH FOOD.    . Dolutegravir-Rilpivirine (JULUCA) 50-25 MG TABS Take 1 tablet by mouth at bedtime. Take with the Prezcobix.    Marland Kitchen HYDROcodone-acetaminophen (NORCO/VICODIN) 5-325 MG tablet Take one tablet by mouth every 6 hours as needed for moderate pain 60 tablet 0  . hydrocortisone cream 1 % Apply 1 application topically daily as needed for itching.    . levothyroxine (SYNTHROID, LEVOTHROID) 150 MCG tablet Take 1 tablet (150 mcg total) by mouth daily before breakfast. 90 tablet 3  . loratadine (CLARITIN) 10 MG tablet Take 10 mg by mouth daily as needed for allergies.     . maraviroc (SELZENTRY) 150 MG tablet Take 1 tablet (150 mg total) by mouth 2 (two) times daily. 60 tablet 11  . Methylsulfonylmethane (MSM) 1000 MG CAPS Take 1,000 mg by mouth 3 (three) times a week.    . Naphazoline-Glycerin (REDNESS RELIEF OP) Place 1 drop into both eyes daily as needed (redness).    Marland Kitchen olmesartan-hydrochlorothiazide (BENICAR HCT)  40-25 MG tablet TAKE 1 TABLET BY MOUTH DAILY. 30 tablet 1  . Omega-3 Fatty Acids (FISH OIL) 1000 MG CAPS Take 1,000 mg by mouth 3 (three) times a week.    . Pitavastatin Calcium 4 MG TABS Take 1 tablet (4 mg total) by mouth daily at 12 noon. This may be placebo, study provided. Do not dispense.    . potassium gluconate 595 (99 K) MG TABS tablet Take 595 mg by mouth 3 (three) times a week.    . vitamin E 400 UNIT  capsule Take 400 Units by mouth 3 (three) times a week.     No current facility-administered medications for this visit.     OBJECTIVE: Vitals:   05/26/18 0945  BP: (!) 137/97  Pulse: 71  Resp: 18  Temp: 97.8 F (36.6 C)  SpO2: 99%     Body mass index is 42.26 kg/m.    Filed Weights   05/26/18 0945  Weight: 269 lb 12.8 oz (122.4 kg)  ECOG FS: 1 GENERAL: Patient is a well appearing female in no acute distress HEENT:  Sclerae anicteric.  Oropharynx clear and moist. No ulcerations or evidence of oropharyngeal candidiasis. Neck is supple.  NODES:  No cervical, supraclavicular, or axillary lymphadenopathy palpated.  BREAST EXAM:  Right breast s/p mastectomy, no sign of local recurrence, left breast benign LUNGS:  Diminished in right lower lobe.  No wheezes or rhonchi. HEART:  Regular rate and rhythm. No murmur appreciated. ABDOMEN:  Soft, nontender.  Positive, normoactive bowel sounds. No organomegaly palpated. MSK:  No focal spinal tenderness to palpation. Full range of motion bilaterally in the upper extremities. No tenderness to right shoulder, or surrounding musculature. EXTREMITIES:  No peripheral edema.   SKIN:  Clear with no obvious rashes or skin changes. No nail dyscrasia. NEURO:  Nonfocal. Well oriented.  Appropriate affect.      LAB RESULTS:  CMP     Component Value Date/Time   NA 144 05/26/2018 0935   NA 140 05/13/2017 0927   K 4.0 05/26/2018 0935   K 4.1 05/13/2017 0927   CL 103 05/26/2018 0935   CO2 29 05/26/2018 0935   CO2 29 05/13/2017 0927    GLUCOSE 97 05/26/2018 0935   GLUCOSE 102 05/13/2017 0927   BUN 17 05/26/2018 0935   BUN 17.7 05/13/2017 0927   CREATININE 1.58 (H) 05/26/2018 0935   CREATININE 1.48 (H) 04/18/2018 0932   CREATININE 1.4 (H) 05/13/2017 0927   CALCIUM 10.8 (H) 05/26/2018 0935   CALCIUM 10.5 (H) 05/13/2017 0927   PROT 8.4 (H) 05/26/2018 0935   PROT 8.1 05/13/2017 0927   ALBUMIN 3.7 05/26/2018 0935   ALBUMIN 3.5 05/13/2017 0927   AST 13 (L) 05/26/2018 0935   AST 14 05/13/2017 0927   ALT 10 05/26/2018 0935   ALT 12 05/13/2017 0927   ALKPHOS 74 05/26/2018 0935   ALKPHOS 90 05/13/2017 0927   BILITOT 0.6 05/26/2018 0935   BILITOT 0.27 05/13/2017 0927   GFRNONAA 35 (L) 05/26/2018 0935   GFRNONAA 38 (L) 04/18/2018 0932   GFRAA 40 (L) 05/26/2018 0935   GFRAA 44 (L) 04/18/2018 0932    No results found for: TOTALPROTELP, ALBUMINELP, A1GS, A2GS, BETS, BETA2SER, GAMS, MSPIKE, SPEI  No results found for: Nils Pyle, [facility]  Lab Results  Component Value Date   WBC 4.9 05/26/2018   NEUTROABS 2.8 05/26/2018   HGB 15.2 (H) 05/26/2018   HCT 45.9 05/26/2018   MCV 97.0 05/26/2018   PLT 178 05/26/2018      Chemistry      Component Value Date/Time   NA 144 05/26/2018 0935   NA 140 05/13/2017 0927   K 4.0 05/26/2018 0935   K 4.1 05/13/2017 0927   CL 103 05/26/2018 0935   CO2 29 05/26/2018 0935   CO2 29 05/13/2017 0927   BUN 17 05/26/2018 0935   BUN 17.7 05/13/2017 0927   CREATININE 1.58 (H) 05/26/2018 0935   CREATININE 1.48 (H) 04/18/2018 0932   CREATININE 1.4 (H) 05/13/2017 0927   GLU 104 02/13/2016  1358      Component Value Date/Time   CALCIUM 10.8 (H) 05/26/2018 0935   CALCIUM 10.5 (H) 05/13/2017 0927   ALKPHOS 74 05/26/2018 0935   ALKPHOS 90 05/13/2017 0927   AST 13 (L) 05/26/2018 0935   AST 14 05/13/2017 0927   ALT 10 05/26/2018 0935   ALT 12 05/13/2017 0927   BILITOT 0.6 05/26/2018 0935   BILITOT 0.27 05/13/2017 0927       Lab Results  Component Value Date    LABCA2 11 04/20/2008    No components found for: [id number]  No results for input(s): INR in the last 168 hours.  Urinalysis    Component Value Date/Time   COLORURINE YELLOW 01/27/2016 1028   APPEARANCEUR CLEAR 01/27/2016 1028   LABSPEC 1.028 01/27/2016 1028   PHURINE 5.5 01/27/2016 1028   GLUCOSEU NEGATIVE 01/27/2016 1028   GLUCOSEU NEG mg/dL 07/23/2006 2113   HGBUR NEGATIVE 01/27/2016 1028   BILIRUBINUR NEGATIVE 01/27/2016 1028   KETONESUR NEGATIVE 01/27/2016 1028   PROTEINUR NEGATIVE 01/27/2016 1028   UROBILINOGEN 1 07/23/2006 2113   NITRITE NEGATIVE 01/27/2016 1028   LEUKOCYTESUR NEGATIVE 01/27/2016 1028     STUDIES:    ELIGIBLE FOR AVAILABLE RESEARCH PROTOCOL: no  ASSESSMENT: 62 y.o. [place] woman   (1) status post right lumpectomy and sentinel lymph node sampling October 2005 for a 0.6 cm invasive ductal carcinoma involving one out of 2 sentinel lymph nodes sampled, grade 3, triple-negative, treated adjuvantly with doxorubicin and cyclophosphamide 4 followed by weekly paclitaxel 7, followed by adjuvant radiation  RECURRENT DISEASE: (2) status post right breast upper outer quadrant biopsy and right axillary lymph node biopsy 10/01/2016, both positive for a T2 N1, stage IIIB invasive ductal carcinoma, triple negative, with an MIB-1 of 50-70%  (3) status post right modified radical mastectomy 11/05/2016 showing a pT2 pN1, stage IIIB invasive ductal carcinoma, grade 3, triple negative, with negative margins  (4) Not a candidate for radiation given prior history  (5) adjuvant chemotherapy consisting of carboplatin and gemcitabine given days 1 and 8 of each 21 day cycle, for 6 cycles, starting 11/20/2016, completed 03/11/2017  (a) day 8 cycle 2 omitted because of neutropenia; Neupogen/Neulasta added  (5) HIV positivity  (6) Genetic testing 06/17/2017:  no pathogenic mutations. Genes tested: APC, ATM, AXIN2, BARD1, BLM, BMPR1A, BRCA1, BRCA2, BRIP1, CDH1, CDK4,  CDKN2A (p14ARF), CDKN2A (p16INK4a), CEBPA, CHEK2, CTNNA1, DICER1, EPCAM*, GATA2, GREM1*, HRAS, KIT, MEN1, MLH1, MSH2, MSH3, MSH6, MUTYH, NBN, NF1, PALB2, PDGFRA, PMS2, POLD1, POLE, PTEN, RAD50, RAD51C, RAD51D, RUNX1, SDHB, SDHC, SDHD, SMAD4, SMARCA4, STK11, TERC, TERT, TP53, TSC1, TSC2, VHL. The following genes were evaluated for sequence changes only: HOXB13*, NTHL1*, SDHA.Marland Kitchen A variant of uncertain significance (VUS) in a gene called NTHL1 was also noted. c.736G>A (p.Ala246Thr)  METASTATIC DISEASE: November 2019  (1) Patient seen in urgent care and ultimately ER on 10/21 for shortness of breath, chest xray demonstrated Right pleural effusion.    (a) right thoracenteses on 10/21 and 11/4 results show atypical cells, non diagnostic  (b) CT chest 04/22/2018 shows re-accumulation of fluid, and right pleural nodularity.  (c) PET scan on 05/01/2018 shows hypermetabolic pleural based metastases, right CP angle nodal metastases, no evidence of malignancy in abdomen and pelvis.  (d) bronchoscopy with biopsy by Dr. Lamonte Sakai and BAL on 05/15/2018 was non diagnostic    PLAN:   Kayla Price is doing well today.  She met with myself and Dr. Jana Hakim to review bronchoscopy results which were non diagnostic.  I have sent message  to Dr. Lamonte Sakai and will refer to Dr. Servando Snare to see if he can biopsy area.  She is not increasingly symptomatic today.    Her shoulder is not tender, and she has full range of motion in her arm, making me believe that the pain she is experiencing is referred from the lung.  Her pain to the area is under control, often amenable to non pharmacologic methods.  She takes Norco if needed, and this is prescribed by Dr. Dewaine Oats.  I encouraged her to remain aware, as noting her symptoms and how they change is very helpful.    Emberlee will return at the end of December for f/u with Dr. Jana Hakim, to hopefully review biopsy results.  She knows to call for any questions or concerns prior to her next  appointment with [place].     Wilber Bihari, NP  05/26/18 10:43 AM Medical Oncology and Hematology [facility] [address] [place], [place] [address] Tel. [phone]    Fax. [phone]     ADDENDUM: Tally understands we are very concerned that she does indeed have breast cancer, But we have not been able to document this. In some cancers we can treat empirically but in breast cancer it is very important to know whether the tumor remains estrogen receptor positive or negative or HER-2 positive or negative and indeed whether it is breast cancer at all.  Accordingly she is going to need a biopsy.  We are trying to set that up as soon as possible for her.  Once it is available, she will return to see me, most likely the last couple of days of this month, at which point we should be able to come up with a definitive treatment plan for her  I personally saw this patient and performed a substantive portion of this encounter with the listed APP documented above.   Chauncey Cruel, MD Medical Oncology and Hematology [facility] [address] [place], [place] [address] Tel. [phone]    Fax. [place], MD Medical Oncology and Hematology [facility] [address] [place], [place] [address] Tel. [phone]    Fax. [phone]

## 2018-05-27 LAB — CANCER ANTIGEN 27.29: CAN 27.29: 18.2 U/mL (ref 0.0–38.6)

## 2018-05-28 ENCOUNTER — Institutional Professional Consult (permissible substitution) (INDEPENDENT_AMBULATORY_CARE_PROVIDER_SITE_OTHER): Payer: Medicare HMO | Admitting: Cardiothoracic Surgery

## 2018-05-28 ENCOUNTER — Ambulatory Visit
Admission: RE | Admit: 2018-05-28 | Discharge: 2018-05-28 | Disposition: A | Discharge status: Home / Self Care | Payer: Medicare HMO | Source: Ambulatory Visit | Attending: Cardiothoracic Surgery | Admitting: Cardiothoracic Surgery

## 2018-05-28 ENCOUNTER — Other Ambulatory Visit: Payer: Self-pay | Admitting: Cardiothoracic Surgery

## 2018-05-28 ENCOUNTER — Other Ambulatory Visit: Payer: Self-pay | Admitting: *Deleted

## 2018-05-28 ENCOUNTER — Other Ambulatory Visit: Payer: Self-pay

## 2018-05-28 ENCOUNTER — Encounter: Payer: Self-pay | Admitting: Cardiothoracic Surgery

## 2018-05-28 VITALS — BP 131/92 | HR 77 | Resp 16 | Ht 67.0 in | Wt 269.0 lb

## 2018-05-28 DIAGNOSIS — R918 Other nonspecific abnormal finding of lung field: Secondary | ICD-10-CM

## 2018-05-28 DIAGNOSIS — J90 Pleural effusion, not elsewhere classified: Secondary | ICD-10-CM

## 2018-05-28 DIAGNOSIS — R911 Solitary pulmonary nodule: Secondary | ICD-10-CM

## 2018-05-28 DIAGNOSIS — Z853 Personal history of malignant neoplasm of breast: Secondary | ICD-10-CM | POA: Diagnosis not present

## 2018-05-28 NOTE — Progress Notes (Signed)
[address]       ,[place] [address]             [phone]                    Lynzie H Ramer Wittmann Medical Record #[id number] Date of Birth: 05/26/1956  Referring: Wilber Bihari Cornett* Primary Care: Lauree Chandler, NP Primary Cardiologist: No primary care provider on file.  Chief Complaint:    Chief Complaint  Patient presents with  . Pleural Effusion    Surgical eval, CT  CHEST 04/22/18, PET 05/01/18  . Lung Lesion    History of Present Illness:    Kayla Price 62 y.o. female is seen in the office  today for evaluation of recurrent right pleural effusion with a PET scan suggestive of pleural-based tumor on the right.  Patient has a history of triple negative breast cancer treated first in 2005, with recurrence in 2018.  She notes over the last 10 weeks she began having increasing dyspnea on exertion, orthopnea, and fatigue.  She has had a thoracentesis done on 2 occasions draining 2.5 L once in 2 L the second time.  Attempted biopsy in paratracheal node by EBUS was unsuccessful, cytologies on the pleural fluid were also negative for tumor.       Current Activity/ Functional Status:  Patient is independent with mobility/ambulation, transfers, ADL's, IADL's.   Zubrod Score: At the time of surgery this patient's most appropriate activity status/level should be described as: _0     0    Normal activity, no symptoms _1     1    Restricted in physical strenuous activity but ambulatory, able to do out light work _2     2    Ambulatory and capable of self care, unable to do work activities, up and about               >50 % of waking hours                              _3     3    Only limited self care, in bed greater than 50% of waking hours _4     4    Completely disabled, no self care, confined to bed or chair _5     5    Moribund   Past Medical History:  Diagnosis Date  . Alopecia areata 11/28/2009  . Bell's palsy   . Cancer [facility]) 2005    Breast cancer   chemotherapy and radiation  . CKD (chronic kidney disease) stage 3, GFR 30-59 ml/min (HCC) 06/08/2015  . Dry eye syndrome   . Family history of lung cancer   . Family history of non-Hodgkin's lymphoma   . Fasting hyperglycemia   . HIP FRACTURE, RIGHT 05/06/2008  . History of kidney stones   . HIV DISEASE 03/27/2006  . HYPERLIPIDEMIA, MIXED 12/15/2007  . HYPERTENSION 03/27/2006  . 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 MENORRHAGIA, POSTMENOPAUSAL 02/03/2009  . Osteoarthritis of left knee 06/08/2015  . OSTEOARTHROSIS, LOCAL, SCND, UNSPC SITE 04/07/2007  . PVD 04/07/2007  . Tinea capitis   . TRIGGER FINGER 05/06/2008  . Unspecified vitamin D deficiency 08/06/2007    Past Surgical History:  Procedure Laterality Date  . BREAST LUMPECTOMY Right    2005  . COLONOSCOPY    . COLONOSCOPY WITH ESOPHAGOGASTRODUODENOSCOPY (EGD)    . ENDOBRONCHIAL ULTRASOUND Bilateral 05/15/2018  Procedure: ENDOBRONCHIAL ULTRASOUND;  Surgeon: Collene Gobble, MD;  Location: [facility];  Service: Cardiopulmonary;  Laterality: Bilateral;  . FINE NEEDLE ASPIRATION BIOPSY  05/15/2018   Procedure: FINE NEEDLE ASPIRATION BIOPSY;  Surgeon: Collene Gobble, MD;  Location: [facility];  Service: Cardiopulmonary;;  . FLEXIBLE BRONCHOSCOPY  05/15/2018   Procedure: FLEXIBLE BRONCHOSCOPY;  Surgeon: Collene Gobble, MD;  Location: [facility];  Service: Cardiopulmonary;;  . HYSTEROSCOPY  2006  . IR THORACENTESIS ASP PLEURAL SPACE W/IMG GUIDE  04/14/2018  . IR THORACENTESIS ASP PLEURAL SPACE W/IMG GUIDE  04/28/2018  . KNEE ARTHROSCOPY Left 2002  . LYMPH NODE DISSECTION  2005  . MASTECTOMY MODIFIED RADICAL Right 11/05/2016   Procedure: RIGHT MASTECTOMY MODIFIED RADICAL;  Surgeon: Fanny Skates, MD;  Location: [facility];  Service: General;  Laterality: Right;  . MODIFIED RADICAL MASTECTOMY Right 11/05/2016  . placement   of port-a-cath    . PORT-A-CATH REMOVAL  2006   insertion 2005  . PORT-A-CATH  REMOVAL N/A 03/29/2017   Procedure: REMOVAL PORT-A-CATH;  Surgeon: Fanny Skates, MD;  Location: [place];  Service: General;  Laterality: N/A;  . PORTACATH PLACEMENT Right 11/05/2016   Procedure: INSERTION PORT-A-CATH WITH ULTRA SOUND;  Surgeon: Fanny Skates, MD;  Location: [place];  Service: General;  Laterality: Right;  . removal of port a cath  12/2014  . THYROID SURGERY  2009   Ablation   . TOTAL KNEE ARTHROPLASTY Left 02/06/2016   Procedure: TOTAL KNEE ARTHROPLASTY;  Surgeon: Frederik Pear, MD;  Location: [place];  Service: Orthopedics;  Laterality: Left;  . TUBAL LIGATION      Family History  Problem Relation Age of Onset  . Heart attack Brother        Massive MI in 29s  . Stroke Brother        CAD  . Kidney disease Mother   . Stroke Mother   . Diabetes Mother   . Liver disease Sister   . COPD Sister        had I-131 rx of hyperthyroidism  . Diabetes Sister   . Stroke Sister   . Lung cancer Paternal Uncle        hx smoking  . Cancer Cousin   . Non-Hodgkin's lymphoma Cousin 25       cancer x3, in prostate and lung- unsure if met/spread or if primaries  . Heart failure Father   . Heart disease Father   . Arthritis Father   . Sarcoidosis Sister      Social History   Tobacco Use  Smoking Status Never Smoker  Smokeless Tobacco Never Used    Social History   Substance and Sexual Activity  Alcohol Use No   Patient lives with her daughter and grandson and son, she notes she has significant help and support at home following surgery    Allergies  Allergen Reactions  . Lisinopril Anaphylaxis and Swelling    Swelling of tongue and mouth 11/05/16- tolerates Olmesartan  . Pepcid [Famotidine] Other (See Comments)    PPI H2, BLOCKERS LOWER GASTRIC PH WHICH WOULD LEAD TO SUBTHERAPEUTIC RILPIVIRINE LEVELS AND POTENTIAL VIROLOGICAL FAILURE WITH RESISTANCE  . Prilosec [Omeprazole] Other (See Comments)    PPI H2, BLOCKERS LOWER GASTRIC PH WHICH WOULD LEAD TO SUBTHERAPEUTIC  RILPIVIRINE LEVELS AND POTENTIAL VIROLOGICAL FAILURE WITH RESISTANCE  . Tums [Calcium Carbonate Antacid] Other (See Comments)    TUMS ANTACIDS CAN LOWER GASTRIC PH WHICH COULD  LEAD TO SUBTHERAPEUTIC RILPIVIRINE LEVELS AND POTENTIAL VIROLOGICAL FAILURE WITH  RESISTANCE TUMS CAN BE GIVEN BUT NEED CONSULT WITH [facility] RE TIMING. I PREFER HER TO AVOID ALL TOGETHER    Current Outpatient Medications  Medication Sig Dispense Refill  . co-enzyme Q-10 30 MG capsule Take 30 mg by mouth 3 (three) times a week.    . darunavir-cobicistat (PREZCOBIX) 800-150 MG tablet Take 1 tablet by mouth at bedtime. SWALLOW WHOLE. DO NOT CRUSH, BREAK OR CHEW TABLETS. TAKE WITH FOOD.    . Dolutegravir-Rilpivirine (JULUCA) 50-25 MG TABS Take 1 tablet by mouth at bedtime. Take with the Prezcobix.    Marland Kitchen HYDROcodone-acetaminophen (NORCO/VICODIN) 5-325 MG tablet Take one tablet by mouth every 6 hours as needed for moderate pain 60 tablet 0  . hydrocortisone cream 1 % Apply 1 application topically daily as needed for itching.    . levothyroxine (SYNTHROID, LEVOTHROID) 150 MCG tablet Take 1 tablet (150 mcg total) by mouth daily before breakfast. 90 tablet 3  . loratadine (CLARITIN) 10 MG tablet Take 10 mg by mouth daily as needed for allergies.     . maraviroc (SELZENTRY) 150 MG tablet Take 1 tablet (150 mg total) by mouth 2 (two) times daily. 60 tablet 11  . Methylsulfonylmethane (MSM) 1000 MG CAPS Take 1,000 mg by mouth 3 (three) times a week.    . Naphazoline-Glycerin (REDNESS RELIEF OP) Place 1 drop into both eyes daily as needed (redness).    Marland Kitchen olmesartan-hydrochlorothiazide (BENICAR HCT) 40-25 MG tablet TAKE 1 TABLET BY MOUTH DAILY. 30 tablet 1  . Omega-3 Fatty Acids (FISH OIL) 1000 MG CAPS Take 1,000 mg by mouth 3 (three) times a week.    . Pitavastatin Calcium 4 MG TABS Take 1 tablet (4 mg total) by mouth daily at 12 noon. This may be placebo, study provided. Do not dispense.    . potassium gluconate 595 (99 K) MG TABS  tablet Take 595 mg by mouth 3 (three) times a week.    . vitamin E 400 UNIT capsule Take 400 Units by mouth 3 (three) times a week.     No current facility-administered medications for this visit.     Pertinent items are noted in HPI.   Review of Systems:     Cardiac Review of Systems: [Y] = yes  or   [ N ] = no   Chest Pain [  n  ]  Resting SOB [  y ] Exertional SOB  [ y ]  Orthopnea [ y ]   Pedal Edema [ n  ]    Palpitations [n  ] Syncope  [ n ]   Presyncope [ n  ]   General Review of Systems: [Y] = yes [  ]=no Constitional: recent weight change [ y ];  Wt loss over the last 3 months [   ] anorexia Blue.Reese  ]; fatigue Blue.Reese  ]; nausea [  ]; night sweats [  ]; fever [  ]; or chills [  ];           Eye : blurred vision [  ]; diplopia [   ]; vision changes [  ];  Amaurosis fugax[  ]; Resp: cough [  ];  wheezing[  ];  hemoptysis[  ]; shortness of breath[y  ]; paroxysmal nocturnal dyspnea[y]; dyspnea on exertion[y  ]; or orthopnea[ y ];  GI:  gallstones[  ], vomiting[  ];  dysphagia[  ]; melena[  ];  hematochezia [  ]; heartburn[  ];   Hx of  Colonoscopy[10 years ago  ];  GU: kidney stones [  ]; hematuria[  ];   dysuria [  ];  nocturia[  ];  history of     obstruction [  ]; urinary frequency [ y ]             Skin: rash, swelling[  ];, hair loss[  ];  peripheral edema[  ];  or itching[  ]; Musculosketetal: myalgias[  ];  joint swelling[  ];  joint erythema[  ];  joint pain[ y ];  back pain[  ];  Heme/Lymph: bruising[  ];  bleeding[  ];  anemia[  ];  Neuro: TIA[  ];  headaches[  ];  stroke[  ];  vertigo[  ];  seizures[  ];   paresthesias[  ];  difficulty walking[  ];  Psych:depression[  ]; anxiety[  ];  Endocrine: diabetes[  ];  thyroid dysfunction[  ];  Immunizations: Flu up to date [ y ]; Pneumococcal up to date [ y ];  Other:     PHYSICAL EXAMINATION: BP (!) 131/92 (BP Location: Left Arm, Patient Position: Sitting, Cuff Size: Large)   Pulse 77   Resp 16   Ht _0  (1.702 m)   Wt 269 lb  (122 kg)   LMP 11/06/2003   SpO2 98% Comment: ON RA  BMI 42.13 kg/m  General appearance: alert, cooperative and appears stated age Head: Normocephalic, without obvious abnormality, atraumatic Neck: no adenopathy, no carotid bruit, no JVD, supple, symmetrical, trachea midline and thyroid not enlarged, symmetric, no tenderness/mass/nodules Lymph nodes: Cervical, supraclavicular, and axillary nodes normal. Resp: diminished breath sounds RLL and RML and dullness to percussion RLL Back: symmetric, no curvature. ROM normal. No CVA tenderness. Cardio: regular rate and rhythm, S1, S2 normal, no murmur, click, rub or gallop GI: soft, non-tender; bowel sounds normal; no masses,  no organomegaly Extremities: extremities normal, atraumatic, no cyanosis or edema and Homans sign is negative, no sign of DVT Neurologic: Grossly normal  Diagnostic Studies & Laboratory data:     Recent Radiology Findings:  Dg Chest 2 View  Result Date: 05/28/2018 CLINICAL DATA:  62 y/o F; right-sided pleural effusion. History of right breast cancer. EXAM: CHEST - 2 VIEW COMPARISON:  04/28/2018 chest radiograph. 05/01/2018 PET-CT. FINDINGS: Stable cardiac silhouette given projection and technique. The right-sided pleural effusion is mildly increased in comparison with prior chest radiograph. Hypermetabolic pleural-based tumor throughout the right hemithorax is better characterized on the prior PET-CT. Clear left lung. Surgical clips project over the right axilla. Bones are unremarkable. IMPRESSION: Right-sided pleural effusion is mildly increased in comparison with prior chest radiograph. Hypermetabolic pleural-based tumor throughout the right hemithorax is better characterized on prior PET-CT. Electronically Signed   By: Kristine Garbe M.D.   On: 05/28/2018 17:15    Nm Pet Image Initial (pi) Skull Base To Thigh  Result Date: 05/02/2018 CLINICAL DATA:  Subsequent treatment strategy for breast cancer current. EXAM:  NUCLEAR MEDICINE PET SKULL BASE TO THIGH TECHNIQUE: 14.4 mCi F-18 FDG was injected intravenously. Full-ring PET imaging was performed from the skull base to thigh after the radiotracer. CT data was obtained and used for attenuation correction and anatomic localization. Fasting blood glucose: 83 mg/dl COMPARISON:  CT chest 04/22/2018 and CT CAP 05/17/2017 FINDINGS: Mediastinal blood pool activity: SUV max 3.65 NECK: No hypermetabolic lymph nodes in the neck. Incidental CT findings: none CHEST: Status post right mastectomy and right axillary nodal dissection. No hypermetabolic axillary, supraclavicular lymph nodes. No hypermetabolic mediastinal or hilar lymph nodes. Moderate to large loculated  right pleural effusion identified. There is extensive hypermetabolic pleural disease overlying the right lung. Along the paramediastinal right upper lobe there is a pleural nodule which measures 3 cm and has an SUV max of 25.9. Within the right lung base paramediastinal subpleural nodule has an SUV max of 8.98. Overlying the anterior right upper lobe pleural nodule measuring 1.3 cm has an SUV max of 10.65. Along the major fissure there is a 1.5 cm nodule within SUV max of 8.98. No left pleural effusion. No hypermetabolic nodules within the left hemithorax. Within the right CP angle there is a hypermetabolic lymph node which measures 2.2 cm and has an SUV max of 9.4. Incidental CT findings: Aortic atherosclerosis noted. No pericardial effusion. Increased caliber of the ascending thoracic aorta measures 4.1 cm. ABDOMEN/PELVIS: No abnormal radiotracer uptake identified within the liver, pancreas, or spleen. No abnormal uptake within the adrenal glands. No hypermetabolic abdominopelvic lymph nodes. Incidental CT findings: Small stones noted within the gallbladder. No abdominopelvic ascites. SKELETON: No focal hypermetabolic activity to suggest skeletal metastasis. Incidental CT findings: none IMPRESSION: 1. Extensive hypermetabolic  pleural based tumor throughout the right hemithorax compatible with pleural metastasis. There is a moderate to large partially loculated right pleural effusion. 2. Hypermetabolic right CP angle nodal metastasis. 3. No evidence for metastatic disease to the abdomen or pelvis. No bone metastasis. 4.  Aortic Atherosclerosis (ICD10-I70.0). 5. Mild aneurysmal dilatation of the ascending thoracic aorta. Recommend annual imaging followup by CTA or MRA. This recommendation follows 2010 ACCF/AHA/AATS/ACR/ASA/SCA/SCAI/SIR/STS/SVM Guidelines for the Diagnosis and Management of Patients with Thoracic Aortic Disease. Circulation. 2010; 121: e266-e369 6. Gallstones. Electronically Signed   By: Kerby Moors M.D.   On: 05/02/2018 09:19     I have independently reviewed the above radiology studies  and reviewed the findings with the patient.   Recent Lab Findings: Lab Results  Component Value Date   WBC 4.9 05/26/2018   HGB 15.2 (H) 05/26/2018   HCT 45.9 05/26/2018   PLT 178 05/26/2018   GLUCOSE 97 05/26/2018   CHOL 199 12/25/2017   TRIG 115 12/25/2017   HDL 60 12/25/2017   LDLCALC 116 (H) 12/25/2017   ALT 10 05/26/2018   AST 13 (L) 05/26/2018   NA 144 05/26/2018   K 4.0 05/26/2018   CL 103 05/26/2018   CREATININE 1.58 (H) 05/26/2018   BUN 17 05/26/2018   CO2 29 05/26/2018   TSH 9.72 (H) 04/24/2018   INR 0.94 05/14/2018   HGBA1C 5.3 04/18/2018      Assessment / Plan:   Radiographic findings suggestive of right malignant pleural effusion with pleural-based hypermetabolic lesions-at least moderate pleural effusion based on PET scan 1 month ago, thoracentesis on 2 previous occasions over the last 10weeks-the history and radiographic findings are most consistent with 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  I discussed with the patient options for the management of the effusion and repeat attempt to obtain a tissue diagnosis to have up-to-date tissue diagnosis with  tissue markers.  This would best be accomplished with a right video-assisted thoracoscopy, drainage of pleural effusion, pleural biopsies, talc pleurodesis, and placement of Pleurx catheter.  This is been reviewed with the patient and she is agreeable.  We will tentatively plan to proceed on Friday afternoon December 6.  I  spent 45 minutes with  the patient face to face.   Grace Isaac MD      [address] [place] [address] Office [phone]  Beeper [phone]  05/28/2018 6:11 PM

## 2018-05-28 NOTE — Telephone Encounter (Signed)
Patient returned call, CB is [phone].  She is at Dr. Everrett Coombe office getting ready to have an x-ray and asks to leave a detailed msg if possible.

## 2018-05-28 NOTE — Telephone Encounter (Signed)
I discussed her case with Dr Servando Snare today. They have a plan in place, and she shouldn't need to follow up here.

## 2018-05-28 NOTE — Progress Notes (Signed)
cxr 

## 2018-05-28 NOTE — Telephone Encounter (Signed)
Attempted to contact pt. I did not receive an answer. I have left a message for pt to return our call.  

## 2018-05-28 NOTE — Telephone Encounter (Signed)
Called and left detailed message on Patient's VM, per Patient request.  Dr. Agustina Caroli recommendations given.

## 2018-05-28 NOTE — Telephone Encounter (Signed)
Spoke with pt. She is aware of RB's message. States that when she saw Oncology on 05/26/18, she was referred to Dr. Servando Snare at [facility]. The plan is to do a biopsy on 05/30/18. Pt questions if she needs to see RB.  RB - please advise. Thanks.

## 2018-05-29 ENCOUNTER — Encounter (HOSPITAL_COMMUNITY): Payer: Self-pay | Admitting: Urology

## 2018-05-29 ENCOUNTER — Encounter: Payer: Self-pay | Admitting: *Deleted

## 2018-05-29 ENCOUNTER — Other Ambulatory Visit: Payer: Self-pay

## 2018-05-29 MED ORDER — DEXTROSE 5 % IV SOLN
3.0000 g | INTRAVENOUS | Status: AC
  Administered 2018-05-30: 3 g via INTRAVENOUS
  Filled 2018-05-29: qty 3

## 2018-05-29 NOTE — Progress Notes (Signed)
Spoke with patient giving pre-op instructions for tomorrow. Pt denies any recent illness, and no chest pain. Pt's questions answered.

## 2018-05-29 NOTE — Progress Notes (Signed)
Contacts for DOS Bethel Born (daughter) [phone] Zanaria Morell (son) [phone] Alonna Buckler (sister) [phone]

## 2018-05-30 ENCOUNTER — Encounter (HOSPITAL_COMMUNITY)
Admission: RE | Disposition: A | Discharge status: Home Health | Payer: Self-pay | Source: Home / Self Care | Attending: Cardiothoracic Surgery

## 2018-05-30 ENCOUNTER — Encounter (HOSPITAL_COMMUNITY): Payer: Self-pay

## 2018-05-30 ENCOUNTER — Inpatient Hospital Stay (HOSPITAL_COMMUNITY): Payer: Medicare HMO

## 2018-05-30 ENCOUNTER — Inpatient Hospital Stay (HOSPITAL_COMMUNITY): Payer: Medicare HMO | Admitting: Certified Registered Nurse Anesthetist

## 2018-05-30 ENCOUNTER — Inpatient Hospital Stay (HOSPITAL_COMMUNITY)
Admission: RE | Admit: 2018-05-30 | Discharge: 2018-06-02 | DRG: 167 | Disposition: A | Discharge status: Home Health | Payer: Medicare HMO | Attending: Cardiothoracic Surgery | Admitting: Cardiothoracic Surgery

## 2018-05-30 ENCOUNTER — Other Ambulatory Visit: Payer: Self-pay

## 2018-05-30 DIAGNOSIS — E1151 Type 2 diabetes mellitus with diabetic peripheral angiopathy without gangrene: Secondary | ICD-10-CM | POA: Diagnosis not present

## 2018-05-30 DIAGNOSIS — Z9221 Personal history of antineoplastic chemotherapy: Secondary | ICD-10-CM

## 2018-05-30 DIAGNOSIS — M1990 Unspecified osteoarthritis, unspecified site: Secondary | ICD-10-CM

## 2018-05-30 DIAGNOSIS — J90 Pleural effusion, not elsewhere classified: Secondary | ICD-10-CM | POA: Diagnosis not present

## 2018-05-30 DIAGNOSIS — Z21 Asymptomatic human immunodeficiency virus [HIV] infection status: Secondary | ICD-10-CM | POA: Diagnosis present

## 2018-05-30 DIAGNOSIS — N183 Chronic kidney disease, stage 3 (moderate): Secondary | ICD-10-CM | POA: Diagnosis present

## 2018-05-30 DIAGNOSIS — Z6841 Body Mass Index (BMI) 40.0 and over, adult: Secondary | ICD-10-CM

## 2018-05-30 DIAGNOSIS — E782 Mixed hyperlipidemia: Secondary | ICD-10-CM | POA: Diagnosis present

## 2018-05-30 DIAGNOSIS — I129 Hypertensive chronic kidney disease with stage 1 through stage 4 chronic kidney disease, or unspecified chronic kidney disease: Secondary | ICD-10-CM | POA: Diagnosis not present

## 2018-05-30 DIAGNOSIS — Z79899 Other long term (current) drug therapy: Secondary | ICD-10-CM | POA: Diagnosis not present

## 2018-05-30 DIAGNOSIS — Z853 Personal history of malignant neoplasm of breast: Secondary | ICD-10-CM

## 2018-05-30 DIAGNOSIS — J939 Pneumothorax, unspecified: Secondary | ICD-10-CM

## 2018-05-30 DIAGNOSIS — C782 Secondary malignant neoplasm of pleura: Principal | ICD-10-CM | POA: Diagnosis present

## 2018-05-30 DIAGNOSIS — Z4682 Encounter for fitting and adjustment of non-vascular catheter: Secondary | ICD-10-CM | POA: Diagnosis not present

## 2018-05-30 DIAGNOSIS — Z96652 Presence of left artificial knee joint: Secondary | ICD-10-CM | POA: Diagnosis present

## 2018-05-30 DIAGNOSIS — E039 Hypothyroidism, unspecified: Secondary | ICD-10-CM | POA: Diagnosis present

## 2018-05-30 DIAGNOSIS — Z923 Personal history of irradiation: Secondary | ICD-10-CM | POA: Diagnosis not present

## 2018-05-30 DIAGNOSIS — Z888 Allergy status to other drugs, medicaments and biological substances status: Secondary | ICD-10-CM | POA: Diagnosis not present

## 2018-05-30 DIAGNOSIS — R911 Solitary pulmonary nodule: Secondary | ICD-10-CM

## 2018-05-30 DIAGNOSIS — C50911 Malignant neoplasm of unspecified site of right female breast: Secondary | ICD-10-CM | POA: Diagnosis not present

## 2018-05-30 DIAGNOSIS — J9811 Atelectasis: Secondary | ICD-10-CM | POA: Diagnosis not present

## 2018-05-30 DIAGNOSIS — Z01818 Encounter for other preprocedural examination: Secondary | ICD-10-CM | POA: Diagnosis not present

## 2018-05-30 DIAGNOSIS — C384 Malignant neoplasm of pleura: Secondary | ICD-10-CM | POA: Diagnosis not present

## 2018-05-30 HISTORY — PX: VIDEO ASSISTED THORACOSCOPY: SHX5073

## 2018-05-30 HISTORY — PX: CHEST TUBE INSERTION: SHX231

## 2018-05-30 HISTORY — DX: Gestational diabetes mellitus in pregnancy, unspecified control: O24.419

## 2018-05-30 HISTORY — PX: PLEURAL EFFUSION DRAINAGE: SHX5099

## 2018-05-30 HISTORY — PX: PLEURAL BIOPSY: SHX5082

## 2018-05-30 HISTORY — PX: TALC PLEURODESIS: SHX2506

## 2018-05-30 LAB — POCT I-STAT 7, (LYTES, BLD GAS, ICA,H+H)
Acid-Base Excess: 1 mmol/L (ref 0.0–2.0)
Bicarbonate: 26.9 mmol/L (ref 20.0–28.0)
Calcium, Ion: 1.35 mmol/L (ref 1.15–1.40)
HCT: 41 % (ref 36.0–46.0)
Hemoglobin: 13.9 g/dL (ref 12.0–15.0)
O2 Saturation: 98 %
Patient temperature: 35.1
Potassium: 3.4 mmol/L — ABNORMAL LOW (ref 3.5–5.1)
Sodium: 140 mmol/L (ref 135–145)
TCO2: 28 mmol/L (ref 22–32)
pCO2 arterial: 41.3 mmHg (ref 32.0–48.0)
pH, Arterial: 7.413 (ref 7.350–7.450)
pO2, Arterial: 103 mmHg (ref 83.0–108.0)

## 2018-05-30 LAB — MRSA PCR SCREENING: MRSA by PCR: NEGATIVE

## 2018-05-30 LAB — URINALYSIS, ROUTINE W REFLEX MICROSCOPIC
Bilirubin Urine: NEGATIVE
Glucose, UA: NEGATIVE mg/dL
Hgb urine dipstick: NEGATIVE
Ketones, ur: NEGATIVE mg/dL
Leukocytes, UA: NEGATIVE
Nitrite: NEGATIVE
Protein, ur: NEGATIVE mg/dL
Specific Gravity, Urine: 1.017 (ref 1.005–1.030)
pH: 6 (ref 5.0–8.0)

## 2018-05-30 LAB — APTT: aPTT: 30 seconds (ref 24–36)

## 2018-05-30 LAB — GLUCOSE, CAPILLARY
Glucose-Capillary: 77 mg/dL (ref 70–99)
Glucose-Capillary: 86 mg/dL (ref 70–99)
Glucose-Capillary: 104 mg/dL — ABNORMAL HIGH (ref 70–99)

## 2018-05-30 LAB — BLOOD GAS, ARTERIAL
Acid-Base Excess: 3.2 mmol/L — ABNORMAL HIGH (ref 0.0–2.0)
Bicarbonate: 27.1 mmol/L (ref 20.0–28.0)
Drawn by: 449841
O2 Saturation: 93.8 %
Patient temperature: 98.6
pCO2 arterial: 40.8 mmHg (ref 32.0–48.0)
pH, Arterial: 7.438 (ref 7.350–7.450)
pO2, Arterial: 70.4 mmHg — ABNORMAL LOW (ref 83.0–108.0)

## 2018-05-30 LAB — TYPE AND SCREEN
ABO/RH(D): O POS
Antibody Screen: NEGATIVE

## 2018-05-30 LAB — PROTIME-INR
INR: 1.05
Prothrombin Time: 13.6 seconds (ref 11.4–15.2)

## 2018-05-30 SURGERY — VIDEO ASSISTED THORACOSCOPY
Anesthesia: General | Site: Chest | Laterality: Right

## 2018-05-30 MED ORDER — POTASSIUM CHLORIDE 10 MEQ/50ML IV SOLN
10.0000 meq | Freq: Every day | INTRAVENOUS | Status: DC | PRN
  Administered 2018-06-01 (×3): 10 meq via INTRAVENOUS
  Filled 2018-05-30 (×3): qty 50

## 2018-05-30 MED ORDER — INSULIN ASPART 100 UNIT/ML ~~LOC~~ SOLN
0.0000 [IU] | Freq: Four times a day (QID) | SUBCUTANEOUS | Status: DC
  Administered 2018-05-31: 2 [IU] via SUBCUTANEOUS
  Administered 2018-05-31: 4 [IU] via SUBCUTANEOUS

## 2018-05-30 MED ORDER — FENTANYL CITRATE (PF) 250 MCG/5ML IJ SOLN
INTRAMUSCULAR | Status: DC | PRN
  Administered 2018-05-30: 100 ug via INTRAVENOUS
  Administered 2018-05-30 (×3): 50 ug via INTRAVENOUS

## 2018-05-30 MED ORDER — FENTANYL CITRATE (PF) 100 MCG/2ML IJ SOLN
INTRAMUSCULAR | Status: AC
  Administered 2018-05-30: 50 ug via INTRAVENOUS
  Filled 2018-05-30: qty 2

## 2018-05-30 MED ORDER — ONDANSETRON HCL 4 MG/2ML IJ SOLN: 4.0000 mg | Freq: Four times a day (QID) | INTRAMUSCULAR | Status: DC | PRN

## 2018-05-30 MED ORDER — BISACODYL 5 MG PO TBEC
10.0000 mg | DELAYED_RELEASE_TABLET | Freq: Every day | ORAL | Status: DC
  Administered 2018-05-30 – 2018-06-01 (×3): 10 mg via ORAL
  Filled 2018-05-30 (×3): qty 2

## 2018-05-30 MED ORDER — SODIUM CHLORIDE 0.9% FLUSH: 9.0000 mL | INTRAVENOUS | Status: DC | PRN

## 2018-05-30 MED ORDER — DIPHENHYDRAMINE HCL 12.5 MG/5ML PO ELIX
12.5000 mg | ORAL_SOLUTION | Freq: Four times a day (QID) | ORAL | Status: DC | PRN
  Filled 2018-05-30: qty 5

## 2018-05-30 MED ORDER — FENTANYL CITRATE (PF) 250 MCG/5ML IJ SOLN
INTRAMUSCULAR | Status: AC
  Filled 2018-05-30: qty 5

## 2018-05-30 MED ORDER — LEVOTHYROXINE SODIUM 150 MCG PO TABS
150.0000 ug | ORAL_TABLET | Freq: Every day | ORAL | Status: DC
  Administered 2018-05-31 – 2018-06-02 (×3): 150 ug via ORAL
  Filled 2018-05-30 (×2): qty 1
  Filled 2018-05-30: qty 2

## 2018-05-30 MED ORDER — TALC 5 G PL SUSR
INTRAPLEURAL | Status: DC | PRN
  Administered 2018-05-30: 4 g via INTRAPLEURAL

## 2018-05-30 MED ORDER — ENOXAPARIN SODIUM 40 MG/0.4ML ~~LOC~~ SOLN
40.0000 mg | SUBCUTANEOUS | Status: DC
  Administered 2018-05-31 – 2018-06-01 (×2): 40 mg via SUBCUTANEOUS
  Filled 2018-05-30 (×2): qty 0.4

## 2018-05-30 MED ORDER — PRAVASTATIN SODIUM 40 MG PO TABS
40.0000 mg | ORAL_TABLET | Freq: Every day | ORAL | Status: DC
  Administered 2018-05-30: 40 mg via ORAL
  Filled 2018-05-30: qty 1

## 2018-05-30 MED ORDER — OXYCODONE HCL 5 MG PO TABS: 5.0000 mg | ORAL_TABLET | Freq: Once | ORAL | Status: DC | PRN

## 2018-05-30 MED ORDER — TRAMADOL HCL 50 MG PO TABS
50.0000 mg | ORAL_TABLET | Freq: Four times a day (QID) | ORAL | Status: DC | PRN
  Administered 2018-06-01: 50 mg via ORAL
  Filled 2018-05-30: qty 1

## 2018-05-30 MED ORDER — ALBUMIN HUMAN 5 % IV SOLN
INTRAVENOUS | Status: DC | PRN
  Administered 2018-05-30 (×2): via INTRAVENOUS

## 2018-05-30 MED ORDER — OXYCODONE HCL 5 MG/5ML PO SOLN: 5.0000 mg | Freq: Once | ORAL | Status: DC | PRN

## 2018-05-30 MED ORDER — MIDAZOLAM HCL 2 MG/2ML IJ SOLN
INTRAMUSCULAR | Status: AC
  Filled 2018-05-30: qty 2

## 2018-05-30 MED ORDER — DARUNAVIR-COBICISTAT 800-150 MG PO TABS
1.0000 | ORAL_TABLET | Freq: Every day | ORAL | Status: DC
  Administered 2018-05-30 – 2018-06-01 (×3): 1 via ORAL
  Filled 2018-05-30 (×4): qty 1

## 2018-05-30 MED ORDER — ONDANSETRON HCL 4 MG/2ML IJ SOLN
INTRAMUSCULAR | Status: DC | PRN
  Administered 2018-05-30: 4 mg via INTRAVENOUS

## 2018-05-30 MED ORDER — MIDAZOLAM HCL 2 MG/2ML IJ SOLN
2.0000 mg | Freq: Once | INTRAMUSCULAR | Status: AC
  Administered 2018-05-30: 2 mg via INTRAVENOUS

## 2018-05-30 MED ORDER — DIPHENHYDRAMINE HCL 50 MG/ML IJ SOLN: 12.5000 mg | Freq: Four times a day (QID) | INTRAMUSCULAR | Status: DC | PRN

## 2018-05-30 MED ORDER — FENTANYL CITRATE (PF) 100 MCG/2ML IJ SOLN
50.0000 ug | Freq: Once | INTRAMUSCULAR | Status: AC
  Administered 2018-05-30: 50 ug via INTRAVENOUS

## 2018-05-30 MED ORDER — DOLUTEGRAVIR-RILPIVIRINE 50-25 MG PO TABS: 1.0000 | ORAL_TABLET | Freq: Every day | ORAL | Status: DC

## 2018-05-30 MED ORDER — NALOXONE HCL 0.4 MG/ML IJ SOLN: 0.4000 mg | INTRAMUSCULAR | Status: DC | PRN

## 2018-05-30 MED ORDER — DEXAMETHASONE SODIUM PHOSPHATE 10 MG/ML IJ SOLN
INTRAMUSCULAR | Status: DC | PRN
  Administered 2018-05-30: 4 mg via INTRAVENOUS

## 2018-05-30 MED ORDER — CEFAZOLIN SODIUM-DEXTROSE 2-4 GM/100ML-% IV SOLN
2.0000 g | Freq: Three times a day (TID) | INTRAVENOUS | Status: AC
  Administered 2018-05-30 – 2018-05-31 (×2): 2 g via INTRAVENOUS
  Filled 2018-05-30 (×2): qty 100

## 2018-05-30 MED ORDER — DEXTROSE-NACL 5-0.45 % IV SOLN
INTRAVENOUS | Status: DC
  Administered 2018-05-30 – 2018-05-31 (×2): via INTRAVENOUS

## 2018-05-30 MED ORDER — PROPOFOL 10 MG/ML IV BOLUS
INTRAVENOUS | Status: AC
  Filled 2018-05-30: qty 20

## 2018-05-30 MED ORDER — FENTANYL CITRATE (PF) 100 MCG/2ML IJ SOLN
25.0000 ug | INTRAMUSCULAR | Status: DC | PRN
  Administered 2018-05-30 (×2): 50 ug via INTRAVENOUS

## 2018-05-30 MED ORDER — LIDOCAINE 2% (20 MG/ML) 5 ML SYRINGE: INTRAMUSCULAR | Status: DC | PRN

## 2018-05-30 MED ORDER — LACTATED RINGERS IV SOLN
INTRAVENOUS | Status: DC | PRN
  Administered 2018-05-30: 14:00:00 via INTRAVENOUS

## 2018-05-30 MED ORDER — OXYCODONE HCL 5 MG PO TABS: 5.0000 mg | ORAL_TABLET | ORAL | Status: DC | PRN

## 2018-05-30 MED ORDER — HYDROMORPHONE HCL 1 MG/ML IJ SOLN
INTRAMUSCULAR | Status: AC
  Filled 2018-05-30: qty 0.5

## 2018-05-30 MED ORDER — LACTATED RINGERS IV SOLN
INTRAVENOUS | Status: DC
  Administered 2018-05-30: 14:00:00 via INTRAVENOUS

## 2018-05-30 MED ORDER — SUGAMMADEX SODIUM 200 MG/2ML IV SOLN
INTRAVENOUS | Status: DC | PRN
  Administered 2018-05-30 (×3): 100 mg via INTRAVENOUS
  Administered 2018-05-30: 200 mg via INTRAVENOUS

## 2018-05-30 MED ORDER — RILPIVIRINE HCL 25 MG PO TABS
25.0000 mg | ORAL_TABLET | Freq: Every day | ORAL | Status: DC
  Administered 2018-05-30 – 2018-05-31 (×2): 25 mg via ORAL
  Filled 2018-05-30 (×2): qty 1

## 2018-05-30 MED ORDER — ACETAMINOPHEN 500 MG PO TABS
1000.0000 mg | ORAL_TABLET | Freq: Four times a day (QID) | ORAL | Status: DC
  Administered 2018-05-30 – 2018-06-02 (×11): 1000 mg via ORAL
  Filled 2018-05-30 (×11): qty 2

## 2018-05-30 MED ORDER — HYDROMORPHONE HCL 1 MG/ML IJ SOLN
INTRAMUSCULAR | Status: DC | PRN
  Administered 2018-05-30: 0.5 mg via INTRAVENOUS

## 2018-05-30 MED ORDER — PROPOFOL 10 MG/ML IV BOLUS
INTRAVENOUS | Status: DC | PRN
  Administered 2018-05-30 (×2): 50 mg via INTRAVENOUS
  Administered 2018-05-30: 150 mg via INTRAVENOUS

## 2018-05-30 MED ORDER — TALC (STERITALC) POWDER FOR INTRAPLEURAL USE
INTRAPLEURAL | Status: AC
  Filled 2018-05-30: qty 4

## 2018-05-30 MED ORDER — SENNOSIDES-DOCUSATE SODIUM 8.6-50 MG PO TABS
1.0000 | ORAL_TABLET | Freq: Every day | ORAL | Status: DC
  Administered 2018-05-30: 1 via ORAL
  Filled 2018-05-30: qty 1

## 2018-05-30 MED ORDER — DOLUTEGRAVIR SODIUM 50 MG PO TABS
50.0000 mg | ORAL_TABLET | Freq: Every day | ORAL | Status: DC
  Administered 2018-05-31: 50 mg via ORAL
  Filled 2018-05-30 (×2): qty 1

## 2018-05-30 MED ORDER — ACETAMINOPHEN 160 MG/5ML PO SOLN: 1000.0000 mg | Freq: Four times a day (QID) | ORAL | Status: DC

## 2018-05-30 MED ORDER — MARAVIROC 300 MG PO TABS
150.0000 mg | ORAL_TABLET | Freq: Two times a day (BID) | ORAL | Status: DC
  Administered 2018-05-30 – 2018-06-02 (×6): 150 mg via ORAL
  Filled 2018-05-30 (×7): qty 1

## 2018-05-30 MED ORDER — ROCURONIUM BROMIDE 10 MG/ML (PF) SYRINGE
PREFILLED_SYRINGE | INTRAVENOUS | Status: DC | PRN
  Administered 2018-05-30: 50 mg via INTRAVENOUS
  Administered 2018-05-30: 10 mg via INTRAVENOUS

## 2018-05-30 MED ORDER — FENTANYL 40 MCG/ML IV SOLN
INTRAVENOUS | Status: DC
  Administered 2018-05-30: 17:00:00 via INTRAVENOUS
  Administered 2018-05-30: 45 ug via INTRAVENOUS
  Administered 2018-05-31 (×2): 15 ug via INTRAVENOUS
  Administered 2018-05-31: 60 ug via INTRAVENOUS
  Administered 2018-05-31 (×2): 30 ug via INTRAVENOUS
  Administered 2018-05-31: 15 ug via INTRAVENOUS
  Administered 2018-05-31: 45 ug via INTRAVENOUS
  Administered 2018-06-01: 15 ug via INTRAVENOUS
  Filled 2018-05-30: qty 1000

## 2018-05-30 MED ORDER — 0.9 % SODIUM CHLORIDE (POUR BTL) OPTIME
TOPICAL | Status: DC | PRN
  Administered 2018-05-30: 2000 mL

## 2018-05-30 MED ORDER — MIDAZOLAM HCL 2 MG/2ML IJ SOLN
INTRAMUSCULAR | Status: AC
  Administered 2018-05-30: 2 mg via INTRAVENOUS
  Filled 2018-05-30: qty 2

## 2018-05-30 MED ORDER — FENTANYL 40 MCG/ML IV SOLN: INTRAVENOUS | Status: DC

## 2018-05-30 SURGICAL SUPPLY — 87 items
ADH SKN CLS APL DERMABOND .7 (GAUZE/BANDAGES/DRESSINGS) ×1
APL SRG 22X2 LUM MLBL SLNT (VASCULAR PRODUCTS)
APL SRG 7X2 LUM MLBL SLNT (VASCULAR PRODUCTS)
APPLICATOR TIP COSEAL (VASCULAR PRODUCTS) IMPLANT
APPLICATOR TIP EXT COSEAL (VASCULAR PRODUCTS) IMPLANT
ATTRACTOMAT 16X20 MAGNETIC DRP (DRAPES) ×3 IMPLANT
BLADE SURG 11 STRL SS (BLADE) IMPLANT
BRUSH SCRUB EZ PLAIN DRY (MISCELLANEOUS) ×12 IMPLANT
CANISTER SUCT 3000ML PPV (MISCELLANEOUS) ×3 IMPLANT
CATH KIT ON Q 5IN SLV (PAIN MANAGEMENT) IMPLANT
CATH ROBINSON RED A/P 16FR (CATHETERS) ×3 IMPLANT
CATH THORACIC 28FR (CATHETERS) IMPLANT
CATH THORACIC 36FR (CATHETERS) IMPLANT
CATH THORACIC 36FR RT ANG (CATHETERS) IMPLANT
CLEANER TIP ELECTROSURG 2X2 (MISCELLANEOUS) IMPLANT
CLIP VESOCCLUDE MED 6/CT (CLIP) IMPLANT
CONT SPEC 4OZ CLIKSEAL STRL BL (MISCELLANEOUS) ×3 IMPLANT
COVER SURGICAL LIGHT HANDLE (MISCELLANEOUS) IMPLANT
COVER TRANSDUCER ULTRASND GEL (DRAPE) IMPLANT
COVER WAND RF STERILE (DRAPES) ×3 IMPLANT
DERMABOND ADVANCED (GAUZE/BANDAGES/DRESSINGS) ×1
DERMABOND ADVANCED .7 DNX12 (GAUZE/BANDAGES/DRESSINGS) ×2 IMPLANT
DRAPE C-ARM 42X72 X-RAY (DRAPES) IMPLANT
DRAPE HALF SHEET 40X57 (DRAPES) ×3 IMPLANT
DRAPE INCISE IOBAN 66X45 STRL (DRAPES) ×3 IMPLANT
DRAPE LAPAROSCOPIC ABDOMINAL (DRAPES) ×3 IMPLANT
DRAPE SLUSH/WARMER DISC (DRAPES) ×3 IMPLANT
DRAPE TABLE BACK 80X90 (DRAPES) ×3 IMPLANT
DRILL BIT 7/64X5 (BIT) IMPLANT
ELECT BLADE 4.0 EZ CLEAN MEGAD (MISCELLANEOUS) ×3
ELECT BLADE 6.5 EXT (BLADE) ×3 IMPLANT
ELECT REM PT RETURN 9FT ADLT (ELECTROSURGICAL) ×3
ELECTRODE BLDE 4.0 EZ CLN MEGD (MISCELLANEOUS) ×2 IMPLANT
ELECTRODE REM PT RTRN 9FT ADLT (ELECTROSURGICAL) ×2 IMPLANT
GAUZE SPONGE 4X4 12PLY STRL (GAUZE/BANDAGES/DRESSINGS) ×3 IMPLANT
GLOVE BIO SURGEON STRL SZ 6.5 (GLOVE) ×12 IMPLANT
GLOVE BIOGEL PI IND STRL 6 (GLOVE) ×8 IMPLANT
GLOVE BIOGEL PI IND STRL 6.5 (GLOVE) ×12 IMPLANT
GLOVE BIOGEL PI INDICATOR 6 (GLOVE) ×4
GLOVE BIOGEL PI INDICATOR 6.5 (GLOVE) ×6
GOWN STRL REUS W/ TWL LRG LVL3 (GOWN DISPOSABLE) ×8 IMPLANT
GOWN STRL REUS W/TWL LRG LVL3 (GOWN DISPOSABLE) ×4
KIT BASIN OR (CUSTOM PROCEDURE TRAY) ×3 IMPLANT
KIT PLEURX DRAIN CATH 1000ML (MISCELLANEOUS) ×3 IMPLANT
KIT PLEURX DRAIN CATH 15.5FR (DRAIN) ×3 IMPLANT
KIT SUCTION CATH 14FR (SUCTIONS) ×3 IMPLANT
KIT TURNOVER KIT B (KITS) ×3 IMPLANT
NS IRRIG 1000ML POUR BTL (IV SOLUTION) ×6 IMPLANT
PACK CHEST (CUSTOM PROCEDURE TRAY) IMPLANT
PACK GENERAL/GYN (CUSTOM PROCEDURE TRAY) ×3 IMPLANT
PAD ARMBOARD 7.5X6 YLW CONV (MISCELLANEOUS) ×6 IMPLANT
PASSER SUT SWANSON 36MM LOOP (INSTRUMENTS) IMPLANT
SEALANT PROGEL (MISCELLANEOUS) IMPLANT
SEALANT SURG COSEAL 4ML (VASCULAR PRODUCTS) IMPLANT
SEALANT SURG COSEAL 8ML (VASCULAR PRODUCTS) IMPLANT
SET DRAINAGE LINE (MISCELLANEOUS) IMPLANT
SOLUTION ANTI FOG 6CC (MISCELLANEOUS) ×3 IMPLANT
SUT ETHILON 3 0 FSL (SUTURE) ×3 IMPLANT
SUT PROLENE 3 0 SH DA (SUTURE) IMPLANT
SUT PROLENE 4 0 RB 1 (SUTURE)
SUT PROLENE 4-0 RB1 .5 CRCL 36 (SUTURE) IMPLANT
SUT SILK  1 MH (SUTURE) ×1
SUT SILK 1 MH (SUTURE) ×2 IMPLANT
SUT SILK 2 0SH CR/8 30 (SUTURE) IMPLANT
SUT SILK 3 0SH CR/8 30 (SUTURE) IMPLANT
SUT VIC AB 1 CTX 18 (SUTURE) IMPLANT
SUT VIC AB 1 CTX 36 (SUTURE)
SUT VIC AB 1 CTX36XBRD ANBCTR (SUTURE) IMPLANT
SUT VIC AB 2-0 CTX 36 (SUTURE) IMPLANT
SUT VIC AB 2-0 UR6 27 (SUTURE) IMPLANT
SUT VIC AB 3-0 X1 27 (SUTURE) ×6 IMPLANT
SUT VICRYL 2 TP 1 (SUTURE) IMPLANT
SWAB COLLECTION DEVICE MRSA (MISCELLANEOUS) IMPLANT
SWAB CULTURE ESWAB REG 1ML (MISCELLANEOUS) IMPLANT
SYR BULB IRRIGATION 50ML (SYRINGE) ×3 IMPLANT
SYSTEM SAHARA CHEST DRAIN ATS (WOUND CARE) ×3 IMPLANT
TAPE CLOTH 4X10 WHT NS (GAUZE/BANDAGES/DRESSINGS) ×3 IMPLANT
TIP APPLICATOR SPRAY EXTEND 16 (VASCULAR PRODUCTS) IMPLANT
TOWEL GREEN STERILE (TOWEL DISPOSABLE) ×6 IMPLANT
TOWEL GREEN STERILE FF (TOWEL DISPOSABLE) IMPLANT
TRAP SPECIMEN MUCOUS 40CC (MISCELLANEOUS) ×3 IMPLANT
TRAY FOLEY CATH SILVER 16FR LF (SET/KITS/TRAYS/PACK) ×3 IMPLANT
TROCAR BLADELESS 12MM (ENDOMECHANICALS) IMPLANT
TROCAR BLADELESS 5M (ENDOMECHANICALS) ×3 IMPLANT
TUNNELER SHEATH ON-Q 11GX8 DSP (PAIN MANAGEMENT) IMPLANT
VALVE REPLACEMENT CAP (MISCELLANEOUS) IMPLANT
WATER STERILE IRR 1000ML POUR (IV SOLUTION) ×6 IMPLANT

## 2018-05-30 NOTE — Anesthesia Postprocedure Evaluation (Signed)
Anesthesia Post Note  Patient: Kayla Price  Procedure(s) Performed: VIDEO ASSISTED THORACOSCOPY (Right Chest) DRAINAGE OF PLEURAL EFFUSION (Right ) PLEURAL BIOPSY (Right ) TALC PLEURADESIS (Right ) INSERTION PLEURAL DRAINAGE CATHETER (Right )     Patient location during evaluation: PACU Anesthesia Type: General Level of consciousness: sedated Pain management: pain level controlled Vital Signs Assessment: post-procedure vital signs reviewed and stable Respiratory status: spontaneous breathing and respiratory function stable Cardiovascular status: stable Postop Assessment: no apparent nausea or vomiting Anesthetic complications: no    Last Vitals:  Vitals:   05/30/18 1645 05/30/18 1700  BP: (!) 142/99 (!) 140/93  Pulse: 78 74  Resp: 18 16  Temp:  36.5 C  SpO2: 95% 98%    Last Pain:  Vitals:   05/30/18 1635  TempSrc:   PainSc: 6                  Tilford Deaton DANIEL

## 2018-05-30 NOTE — Transfer of Care (Signed)
Immediate Anesthesia Transfer of Care Note  Patient: Kayla Price  Procedure(s) Performed: VIDEO ASSISTED THORACOSCOPY (Right Chest) DRAINAGE OF PLEURAL EFFUSION (Right ) PLEURAL BIOPSY (Right ) TALC PLEURADESIS (Right ) INSERTION PLEURAL DRAINAGE CATHETER (Right )  Patient Location: PACU  Anesthesia Type:General  Level of Consciousness: awake, alert  and oriented  Airway & Oxygen Therapy: Patient Spontanous Breathing and Patient connected to face mask oxygen  Post-op Assessment: Report given to RN and Post -op Vital signs reviewed and stable  Post vital signs: Reviewed and stable  Last Vitals:  Vitals Value Taken Time  BP 150/117 05/30/2018  3:44 PM  Temp 36.3 C 05/30/2018  3:44 PM  Pulse 69 05/30/2018  3:47 PM  Resp 17 05/30/2018  3:47 PM  SpO2 98 % 05/30/2018  3:47 PM  Vitals shown include unvalidated device data.  Last Pain:  Vitals:   05/30/18 1116  TempSrc:   PainSc: 0-No pain         Complications: No apparent anesthesia complications

## 2018-05-30 NOTE — Anesthesia Procedure Notes (Signed)
Arterial Line Insertion Start/End12/11/2017 12:10 PM Performed by: Josephine Igo, CRNA, CRNA  Patient location: Pre-op. Preanesthetic checklist: patient identified, IV checked, site marked, risks and benefits discussed, surgical consent, monitors and equipment checked, pre-op evaluation, timeout performed and anesthesia consent Lidocaine 1% used for infiltration Left, radial was placed Catheter size: 20 G Hand hygiene performed  and maximum sterile barriers used   Attempts: 1 Procedure performed without using ultrasound guided technique. Following insertion, dressing applied and Biopatch. Post procedure assessment: normal and unchanged  Patient tolerated the procedure well with no immediate complications.

## 2018-05-30 NOTE — Anesthesia Procedure Notes (Signed)
Procedure Name: Intubation Date/Time: 05/30/2018 1:58 PM Performed by: Jearld Pies, CRNA Pre-anesthesia Checklist: Patient identified, Emergency Drugs available, Suction available and Patient being monitored Patient Re-evaluated:Patient Re-evaluated prior to induction Oxygen Delivery Method: Circle System Utilized Preoxygenation: Pre-oxygenation with 100% oxygen Induction Type: IV induction Ventilation: Mask ventilation without difficulty and Oral airway inserted - appropriate to patient size Laryngoscope Size: Mac and 4 Grade View: Grade I Tube type: Oral Endobronchial tube: Double lumen EBT and Left and 39 Fr Number of attempts: 1 Airway Equipment and Method: Stylet and Oral airway Placement Confirmation: ETT inserted through vocal cords under direct vision,  positive ETCO2 and breath sounds checked- equal and bilateral Secured at: 27 cm Tube secured with: Tape Dental Injury: Teeth and Oropharynx as per pre-operative assessment

## 2018-05-30 NOTE — H&P (Signed)
[address]       ,[place] [address]             [phone]                    Fawne H Boullion [place] Medical Record #[id number] Date of Birth: 06-07-1956 Referring: Wilber Bihari Cornett*/ Dr Magrinat Primary Care: Lauree Chandler, NP Primary Cardiologist: No primary care provider on file.  Chief Complaint:    Chief Complaint  Patient presents with  . Pleural Effusion    Surgical eval, CT  CHEST 04/22/18, PET 05/01/18  . Lung Lesion   History of Present Illness:    Kayla Price 62 y.o. female is seen in the office   for evaluation of recurrent right pleural effusion with a PET scan suggestive of pleural-based tumor on the right.  Patient has a history of triple negative breast cancer treated first in 2005, with recurrence in 2018.  She notes over the last 10 weeks she began having increasing dyspnea on exertion, orthopnea, and fatigue.  She has had a thoracentesis done on 2 occasions draining 2.5 L once in 2 L the second time.  Attempted biopsy in paratracheal node by EBUS was unsuccessful, cytologies on the pleural fluid were also negative for tumor.       Current Activity/ Functional Status:  Patient is independent with mobility/ambulation, transfers, ADL's, IADL's.   Zubrod Score: At the time of surgery this patient's most appropriate activity status/level should be described as: _0     0    Normal activity, no symptoms _1     1    Restricted in physical strenuous activity but ambulatory, able to do out light work _2     2    Ambulatory and capable of self care, unable to do work activities, up and about               >50 % of waking hours                              _3     3    Only limited self care, in bed greater than 50% of waking hours _4     4    Completely disabled, no self care, confined to bed or chair _5     5    Moribund   Past Medical History:  Diagnosis Date  . Alopecia areata 11/28/2009  . Bell's palsy   . Cancer [facility])  2005   Breast cancer   chemotherapy and radiation  . CKD (chronic kidney disease) stage 3, GFR 30-59 ml/min (HCC) 06/08/2015  . Dry eye syndrome   . Family history of lung cancer   . Family history of non-Hodgkin's lymphoma   . Fasting hyperglycemia   . Gestational diabetes    2001  . HIP FRACTURE, RIGHT 05/06/2008  . History of kidney stones   . HIV DISEASE 03/27/2006  . HYPERLIPIDEMIA, MIXED 12/15/2007  . HYPERTENSION 03/27/2006  . 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 MENORRHAGIA, POSTMENOPAUSAL 02/03/2009  . Osteoarthritis of left knee 06/08/2015  . OSTEOARTHROSIS, LOCAL, SCND, UNSPC SITE 04/07/2007  . PVD 04/07/2007  . Tinea capitis   . TRIGGER FINGER 05/06/2008  . Unspecified vitamin D deficiency 08/06/2007    Past Surgical History:  Procedure Laterality Date  . BREAST LUMPECTOMY Right    2005  . COLONOSCOPY    . COLONOSCOPY WITH ESOPHAGOGASTRODUODENOSCOPY (EGD)    .  ENDOBRONCHIAL ULTRASOUND Bilateral 05/15/2018   Procedure: ENDOBRONCHIAL ULTRASOUND;  Surgeon: Collene Gobble, MD;  Location: [facility];  Service: Cardiopulmonary;  Laterality: Bilateral;  . FINE NEEDLE ASPIRATION BIOPSY  05/15/2018   Procedure: FINE NEEDLE ASPIRATION BIOPSY;  Surgeon: Collene Gobble, MD;  Location: [facility];  Service: Cardiopulmonary;;  . FLEXIBLE BRONCHOSCOPY  05/15/2018   Procedure: FLEXIBLE BRONCHOSCOPY;  Surgeon: Collene Gobble, MD;  Location: [facility];  Service: Cardiopulmonary;;  . HYSTEROSCOPY  2006  . IR THORACENTESIS ASP PLEURAL SPACE W/IMG GUIDE  04/14/2018  . IR THORACENTESIS ASP PLEURAL SPACE W/IMG GUIDE  04/28/2018  . KNEE ARTHROSCOPY Left 2002  . LYMPH NODE DISSECTION  2005  . MASTECTOMY MODIFIED RADICAL Right 11/05/2016   Procedure: RIGHT MASTECTOMY MODIFIED RADICAL;  Surgeon: Fanny Skates, MD;  Location: [place];  Service: General;  Laterality: Right;  . MODIFIED RADICAL MASTECTOMY Right 11/05/2016  . placement   of port-a-cath    . PORT-A-CATH REMOVAL   2006   insertion 2005  . PORT-A-CATH REMOVAL N/A 03/29/2017   Procedure: REMOVAL PORT-A-CATH;  Surgeon: Fanny Skates, MD;  Location: [place];  Service: General;  Laterality: N/A;  . PORTACATH PLACEMENT Right 11/05/2016   Procedure: INSERTION PORT-A-CATH WITH ULTRA SOUND;  Surgeon: Fanny Skates, MD;  Location: [place];  Service: General;  Laterality: Right;  . removal of port a cath  12/2014  . THYROID SURGERY  2009   Ablation   . TOTAL KNEE ARTHROPLASTY Left 02/06/2016   Procedure: TOTAL KNEE ARTHROPLASTY;  Surgeon: Frederik Pear, MD;  Location: [place];  Service: Orthopedics;  Laterality: Left;  . TUBAL LIGATION      Family History  Problem Relation Age of Onset  . Heart attack Brother        Massive MI in 67s  . Stroke Brother        CAD  . Kidney disease Mother   . Stroke Mother   . Diabetes Mother   . Liver disease Sister   . COPD Sister        had I-131 rx of hyperthyroidism  . Diabetes Sister   . Stroke Sister   . Lung cancer Paternal Uncle        hx smoking  . Cancer Cousin   . Non-Hodgkin's lymphoma Cousin 25       cancer x3, in prostate and lung- unsure if met/spread or if primaries  . Heart failure Father   . Heart disease Father   . Arthritis Father   . Sarcoidosis Sister      Social History   Tobacco Use  Smoking Status Never Smoker  Smokeless Tobacco Never Used    Social History   Substance and Sexual Activity  Alcohol Use No   Patient lives with her daughter and grandson and son, she notes she has significant help and support at home following surgery    Allergies  Allergen Reactions  . Lisinopril Anaphylaxis and Swelling    Swelling of tongue and mouth 11/05/16- tolerates Olmesartan  . Pepcid [Famotidine] Other (See Comments)    PPI H2, BLOCKERS LOWER GASTRIC PH WHICH WOULD LEAD TO SUBTHERAPEUTIC RILPIVIRINE LEVELS AND POTENTIAL VIROLOGICAL FAILURE WITH RESISTANCE  . Prilosec [Omeprazole] Other (See Comments)    PPI H2, BLOCKERS LOWER GASTRIC PH  WHICH WOULD LEAD TO SUBTHERAPEUTIC RILPIVIRINE LEVELS AND POTENTIAL VIROLOGICAL FAILURE WITH RESISTANCE  . Tums [Calcium Carbonate Antacid] Other (See Comments)    TUMS ANTACIDS CAN LOWER GASTRIC PH WHICH COULD  LEAD TO SUBTHERAPEUTIC RILPIVIRINE  LEVELS AND POTENTIAL VIROLOGICAL FAILURE WITH RESISTANCE TUMS CAN BE GIVEN BUT NEED CONSULT WITH [facility] RE TIMING. I PREFER HER TO AVOID ALL TOGETHER    Current Facility-Administered Medications  Medication Dose Route Frequency Provider Last Rate Last Dose  . ceFAZolin (ANCEF) 3 g in dextrose 5 % 50 mL IVPB  3 g Intravenous To SS-Surg Grace Isaac, MD      . fentaNYL (SUBLIMAZE) 100 MCG/2ML injection           . lactated ringers infusion   Intravenous Continuous Catalina Gravel, MD      . midazolam (VERSED) 2 MG/2ML injection             Pertinent items are noted in HPI.   Review of Systems:     Cardiac Review of Systems: [Y] = yes  or   [ N ] = no   Chest Pain [  n  ]  Resting SOB [  y ] Exertional SOB  [ y ]  Orthopnea [ y ]   Pedal Edema [ n  ]    Palpitations [n  ] Syncope  [ n ]   Presyncope [ n  ]   General Review of Systems: [Y] = yes [  ]=no Constitional: recent weight change [ y ];  Wt loss over the last 3 months [   ] anorexia Blue.Reese  ]; fatigue Blue.Reese  ]; nausea [  ]; night sweats [  ]; fever [  ]; or chills [  ];           Eye : blurred vision [  ]; diplopia [   ]; vision changes [  ];  Amaurosis fugax[  ]; Resp: cough [  ];  wheezing[  ];  hemoptysis[  ]; shortness of breath[y  ]; paroxysmal nocturnal dyspnea[y]; dyspnea on exertion[y  ]; or orthopnea[ y ];  GI:  gallstones[  ], vomiting[  ];  dysphagia[  ]; melena[  ];  hematochezia [  ]; heartburn[  ];   Hx of  Colonoscopy[10 years ago  ]; GU: kidney stones [  ]; hematuria[  ];   dysuria [  ];  nocturia[  ];  history of     obstruction [  ]; urinary frequency [ y ]             Skin: rash, swelling[  ];, hair loss[  ];  peripheral edema[  ];  or itching[  ]; Musculosketetal:  myalgias[  ];  joint swelling[  ];  joint erythema[  ];  joint pain[ y ];  back pain[  ];  Heme/Lymph: bruising[  ];  bleeding[  ];  anemia[  ];  Neuro: TIA[  ];  headaches[  ];  stroke[  ];  vertigo[  ];  seizures[  ];   paresthesias[  ];  difficulty walking[  ];  Psych:depression[  ]; anxiety[  ];  Endocrine: diabetes[  ];  thyroid dysfunction[  ];  Immunizations: Flu up to date [ y ]; Pneumococcal up to date [ y ];  Other:     PHYSICAL EXAMINATION: BP (!) 141/94   Pulse 82   Temp 97.9 F (36.6 C) (Oral)   Resp 18   Ht _0  (1.702 m)   Wt 122 kg   LMP 11/06/2003   SpO2 97%   BMI 42.13 kg/m  General appearance: alert, cooperative and appears stated age Head: Normocephalic, without obvious abnormality, atraumatic Neck: no adenopathy, no carotid bruit,  no JVD, supple, symmetrical, trachea midline and thyroid not enlarged, symmetric, no tenderness/mass/nodules Lymph nodes: Cervical, supraclavicular, and axillary nodes normal. Resp: diminished breath sounds RLL and RML and dullness to percussion RLL Back: symmetric, no curvature. ROM normal. No CVA tenderness. Cardio: regular rate and rhythm, S1, S2 normal, no murmur, click, rub or gallop GI: soft, non-tender; bowel sounds normal; no masses,  no organomegaly Extremities: extremities normal, atraumatic, no cyanosis or edema and Homans sign is negative, no sign of DVT Neurologic: Grossly normal  Diagnostic Studies & Laboratory data:     Recent Radiology Findings:  Dg Chest 2 View  Result Date: 05/30/2018 CLINICAL DATA:  Preoperative examination prior to be ats. Patient reports right-sided chest tightness for the past 2 months but no shortness of breath. History of breast malignancy and right mastectomy in 2018 with thoracentesis ease in October and November. EXAM: CHEST - 2 VIEW COMPARISON:  PA and lateral chest x-ray of May 28, 2018 FINDINGS: There is a stable moderate-sized right pleural effusion with fluid in the minor  fissure. There is no pneumothorax. The left lung is clear. The heart and pulmonary vascularity are normal. There is stable mild soft tissue fullness in the lower right paratracheal region which likely reflects lymphadenopathy. The bony thorax exhibits no acute abnormality. IMPRESSION: Stable moderate-sized right pleural effusion. No acute change since the previous study. Electronically Signed   By: David  Martinique M.D.   On: 05/30/2018 11:16    Nm Pet Image Initial (pi) Skull Base To Thigh  Result Date: 05/02/2018 CLINICAL DATA:  Subsequent treatment strategy for breast cancer current. EXAM: NUCLEAR MEDICINE PET SKULL BASE TO THIGH TECHNIQUE: 14.4 mCi F-18 FDG was injected intravenously. Full-ring PET imaging was performed from the skull base to thigh after the radiotracer. CT data was obtained and used for attenuation correction and anatomic localization. Fasting blood glucose: 83 mg/dl COMPARISON:  CT chest 04/22/2018 and CT CAP 05/17/2017 FINDINGS: Mediastinal blood pool activity: SUV max 3.65 NECK: No hypermetabolic lymph nodes in the neck. Incidental CT findings: none CHEST: Status post right mastectomy and right axillary nodal dissection. No hypermetabolic axillary, supraclavicular lymph nodes. No hypermetabolic mediastinal or hilar lymph nodes. Moderate to large loculated right pleural effusion identified. There is extensive hypermetabolic pleural disease overlying the right lung. Along the paramediastinal right upper lobe there is a pleural nodule which measures 3 cm and has an SUV max of 25.9. Within the right lung base paramediastinal subpleural nodule has an SUV max of 8.98. Overlying the anterior right upper lobe pleural nodule measuring 1.3 cm has an SUV max of 10.65. Along the major fissure there is a 1.5 cm nodule within SUV max of 8.98. No left pleural effusion. No hypermetabolic nodules within the left hemithorax. Within the right CP angle there is a hypermetabolic lymph node which measures 2.2 cm  and has an SUV max of 9.4. Incidental CT findings: Aortic atherosclerosis noted. No pericardial effusion. Increased caliber of the ascending thoracic aorta measures 4.1 cm. ABDOMEN/PELVIS: No abnormal radiotracer uptake identified within the liver, pancreas, or spleen. No abnormal uptake within the adrenal glands. No hypermetabolic abdominopelvic lymph nodes. Incidental CT findings: Small stones noted within the gallbladder. No abdominopelvic ascites. SKELETON: No focal hypermetabolic activity to suggest skeletal metastasis. Incidental CT findings: none IMPRESSION: 1. Extensive hypermetabolic pleural based tumor throughout the right hemithorax compatible with pleural metastasis. There is a moderate to large partially loculated right pleural effusion. 2. Hypermetabolic right CP angle nodal metastasis. 3. No evidence for metastatic disease  to the abdomen or pelvis. No bone metastasis. 4.  Aortic Atherosclerosis (ICD10-I70.0). 5. Mild aneurysmal dilatation of the ascending thoracic aorta. Recommend annual imaging followup by CTA or MRA. This recommendation follows 2010 ACCF/AHA/AATS/ACR/ASA/SCA/SCAI/SIR/STS/SVM Guidelines for the Diagnosis and Management of Patients with Thoracic Aortic Disease. Circulation. 2010; 121: e266-e369 6. Gallstones. Electronically Signed   By: Kerby Moors M.D.   On: 05/02/2018 09:19     I have independently reviewed the above radiology studies  and reviewed the findings with the patient.   Recent Lab Findings: Lab Results  Component Value Date   WBC 4.9 05/26/2018   HGB 15.2 (H) 05/26/2018   HCT 45.9 05/26/2018   PLT 178 05/26/2018   GLUCOSE 97 05/26/2018   CHOL 199 12/25/2017   TRIG 115 12/25/2017   HDL 60 12/25/2017   LDLCALC 116 (H) 12/25/2017   ALT 10 05/26/2018   AST 13 (L) 05/26/2018   NA 144 05/26/2018   K 4.0 05/26/2018   CL 103 05/26/2018   CREATININE 1.58 (H) 05/26/2018   BUN 17 05/26/2018   CO2 29 05/26/2018   TSH 9.72 (H) 04/24/2018   INR 1.05  05/30/2018   HGBA1C 5.3 04/18/2018      Assessment / Plan:   Radiographic findings suggestive of right malignant pleural effusion with pleural-based hypermetabolic lesions-at least moderate pleural effusion based on PET scan 1 month ago, thoracentesis on 2 previous occasions over the last 10 weeks-the history and radiographic findings are most consistent with 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  I discussed with the patient options for the management of the effusion and repeat attempt to obtain a tissue diagnosis to have up-to-date tissue diagnosis with tissue markers.  This would best be accomplished with a right video-assisted thoracoscopy, drainage of pleural effusion, pleural biopsies, talc pleurodesis, and placement of Pleurx catheter.  This is been reviewed with the patient and she is agreeable.    The goals risks and alternatives of the planned surgical procedure Procedure(s): VIDEO ASSISTED THORACOSCOPY (Right) DRAINAGE OF PLEURAL EFFUSION (Right) PLEURAL BIOPSY (N/A) TALC PLEURADESIS (Right) INSERTION PLEURAL DRAINAGE CATHETER (Right)  have been discussed with the patient in detail. The risks of the procedure including death, infection, stroke, myocardial infarction, bleeding, blood transfusion have all been discussed specifically.  I have quoted Kayla Price a 1 % of perioperative mortality and a complication rate as high as 10 %. The patient's questions have been answered.Kayla Price is willing  to proceed with the planned procedure.   Grace Isaac MD      [address] [place] [address] Office [phone]   Beeper [phone]  05/30/2018 12:36 PM

## 2018-05-30 NOTE — Progress Notes (Signed)
Patient interviewed in the preop area. Patient able to confirm name, DOB, procedure, allergies, metal in body (left knee), npo status and no pain at this time.   Leatha Gilding, RN

## 2018-05-30 NOTE — Brief Op Note (Addendum)
      [address]       [place] [address]             [phone]      05/30/2018  3:07 PM  PATIENT:  Kayla Price  62 y.o. female  PRE-OPERATIVE DIAGNOSIS:  1. RECURRENT RIGHT PLEURAL EFFUSION 2. HISTORY of BREAST CANCER   POST-OPERATIVE DIAGNOSIS:  1. RECURRENT RIGHT PLEURAL EFFUSION 2. HISTORY OF BREAST CANCER- carcinoma involved plural surface right chest - final path pending   PROCEDURE:  RIGHT VIDEO ASSISTED THORACOSCOPY, DRAINAGE OF PLEURAL RIGHT EFFUSION,  PLEURAL BIOPSIES, TALC PLEURADESIS, and INSERTION RIGHT PLEURAL DRAINAGE CATHETER   FINDINGS: 1250 cc of pleural fluid removed.  SURGEON:  Surgeon(s) and Role:    Grace Isaac, MD - Primary  PHYSICIAN ASSISTANT: Lars Pinks PA-C  ANESTHESIA:   general  EBL:  15 mL   BLOOD ADMINISTERED:none  DRAINS: Blake drain and Pleur X cathter placed in the right pleural space   SPECIMEN:  Source of Specimen:  Right pleural fluid and mulitple pleural biopsies  DISPOSITION OF SPECIMEN:  Pathology-frozen of pleural biopsy consistent with carcinoma  COUNTS CORRECT:  YES  DICTATION: .Dragon Dictation  PLAN OF CARE: Admit to inpatient   PATIENT DISPOSITION:  PACU - hemodynamically stable.   Delay start of Pharmacological VTE agent (>24hrs) due to surgical blood loss or risk of bleeding: yes

## 2018-05-30 NOTE — Anesthesia Procedure Notes (Signed)
Central Venous Catheter Insertion Performed by: Albertha Ghee, MD, anesthesiologist Start/End12/11/2017 12:40 PM, 05/30/2018 12:51 PM Patient location: Pre-op. Preanesthetic checklist: patient identified, IV checked, site marked, risks and benefits discussed, surgical consent, monitors and equipment checked, pre-op evaluation, timeout performed and anesthesia consent Position: Trendelenburg Lidocaine 1% used for infiltration and patient sedated Hand hygiene performed , maximum sterile barriers used  and Seldinger technique used Catheter size: 7 Fr Central line was placed.Double lumen Procedure performed using ultrasound guided technique. Ultrasound Notes:anatomy identified, needle tip was noted to be adjacent to the nerve/plexus identified and no ultrasound evidence of intravascular and/or intraneural injection Attempts: 1 Following insertion, line sutured, dressing applied and Biopatch. Post procedure assessment: blood return through all ports, free fluid flow and no air  Patient tolerated the procedure well with no immediate complications.

## 2018-05-30 NOTE — Progress Notes (Signed)
      [address]       [place] [address]             [phone]      S/p VATS, drainage of effusion, talc pleurodesis  BP (!) 157/103   Pulse 74   Temp 97.7 F (36.5 C)   Resp 18   Ht 5\' 7"  (1.702 m)   Wt 122 kg   LMP 11/06/2003   SpO2 94%   BMI 42.13 kg/m   Intake/Output Summary (Last 24 hours) at 05/30/2018 1752 Last data filed at 05/30/2018 1721 Gross per 24 hour  Intake 1695 ml  Output 320 ml  Net 1375 ml   PORTABLE CHEST 1 VIEW  COMPARISON:  05/30/2018 and prior radiographs  FINDINGS: RIGHT thoracostomy tubes are noted without pneumothorax.  Decreased RIGHT pleural effusion with continued RIGHT LOWER lung opacities/atelectasis.  A RIGHT IJ central venous catheter is present with tip overlying the LOWER SVC.  The LEFT lung is clear.  IMPRESSION: Postoperative changes with support apparatus as described including RIGHT thoracostomy tubes. No pneumothorax. Decreased RIGHT pleural effusion with continued RIGHT LOWER lung opacities/atelectasis.   Electronically Signed   By: Margarette Canada M.D.   On: 05/30/2018 16:20  Doing well early postop  Remo Lipps C. Roxan Hockey, MD [facility] [phone]

## 2018-05-30 NOTE — Anesthesia Preprocedure Evaluation (Signed)
Anesthesia Evaluation  Patient identified by MRN, date of birth, ID band Patient awake    Reviewed: Allergy & Precautions, H&P , NPO status , Patient's Chart, lab work & pertinent test results  Airway Mallampati: II   Neck ROM: full    Dental   Pulmonary  Pleural effusion   breath sounds clear to auscultation       Cardiovascular hypertension, + Peripheral Vascular Disease   Rhythm:regular Rate:Normal     Neuro/Psych  Neuromuscular disease    GI/Hepatic   Endo/Other  diabetes, Type 2Morbid obesity  Renal/GU Renal InsufficiencyRenal disease     Musculoskeletal  (+) Arthritis ,   Abdominal   Peds  Hematology  (+) HIV,   Anesthesia Other Findings   Reproductive/Obstetrics                             Anesthesia Physical Anesthesia Plan  ASA: III  Anesthesia Plan: General   Post-op Pain Management:    Induction: Intravenous  PONV Risk Score and Plan: 3 and Ondansetron, Dexamethasone, Midazolam and Treatment may vary due to age or medical condition  Airway Management Planned: Double Lumen EBT  Additional Equipment: Arterial line, CVP and Ultrasound Guidance Line Placement  Intra-op Plan:   Post-operative Plan: Extubation in OR  Informed Consent: I have reviewed the patients History and Physical, chart, labs and discussed the procedure including the risks, benefits and alternatives for the proposed anesthesia with the patient or authorized representative who has indicated his/her understanding and acceptance.     Plan Discussed with: CRNA, Anesthesiologist and Surgeon  Anesthesia Plan Comments:         Anesthesia Quick Evaluation

## 2018-05-31 ENCOUNTER — Encounter (HOSPITAL_COMMUNITY): Payer: Self-pay | Admitting: Cardiothoracic Surgery

## 2018-05-31 ENCOUNTER — Inpatient Hospital Stay (HOSPITAL_COMMUNITY): Payer: Medicare HMO

## 2018-05-31 LAB — POCT I-STAT 3, ART BLOOD GAS (G3+)
Bicarbonate: 25.8 mmol/L (ref 20.0–28.0)
O2 Saturation: 96 %
Patient temperature: 98.6
TCO2: 27 mmol/L (ref 22–32)
pCO2 arterial: 43.7 mmHg (ref 32.0–48.0)
pH, Arterial: 7.378 (ref 7.350–7.450)
pO2, Arterial: 84 mmHg (ref 83.0–108.0)

## 2018-05-31 LAB — GLUCOSE, CAPILLARY
Glucose-Capillary: 111 mg/dL — ABNORMAL HIGH (ref 70–99)
Glucose-Capillary: 117 mg/dL — ABNORMAL HIGH (ref 70–99)
Glucose-Capillary: 146 mg/dL — ABNORMAL HIGH (ref 70–99)
Glucose-Capillary: 163 mg/dL — ABNORMAL HIGH (ref 70–99)

## 2018-05-31 LAB — BASIC METABOLIC PANEL
Anion gap: 11 (ref 5–15)
BUN: 15 mg/dL (ref 8–23)
CO2: 25 mmol/L (ref 22–32)
Calcium: 9.8 mg/dL (ref 8.9–10.3)
Chloride: 100 mmol/L (ref 98–111)
Creatinine, Ser: 1.45 mg/dL — ABNORMAL HIGH (ref 0.44–1.00)
GFR calc Af Amer: 45 mL/min — ABNORMAL LOW (ref >60)
GFR calc non Af Amer: 38 mL/min — ABNORMAL LOW (ref >60)
Glucose, Bld: 168 mg/dL — ABNORMAL HIGH (ref 70–99)
Potassium: 3.9 mmol/L (ref 3.5–5.1)
Sodium: 136 mmol/L (ref 135–145)

## 2018-05-31 LAB — CBC
HCT: 42.7 % (ref 36.0–46.0)
Hemoglobin: 14 g/dL (ref 12.0–15.0)
MCH: 32 pg (ref 26.0–34.0)
MCHC: 32.8 g/dL (ref 30.0–36.0)
MCV: 97.7 fL (ref 80.0–100.0)
PLATELETS: 183 10*3/uL (ref 150–400)
RBC: 4.37 MIL/uL (ref 3.87–5.11)
RDW: 13.2 % (ref 11.5–15.5)
WBC: 9.5 10*3/uL (ref 4.0–10.5)
nRBC: 0 % (ref 0.0–0.2)

## 2018-05-31 MED ORDER — RILPIVIRINE HCL 25 MG PO TABS
25.0000 mg | ORAL_TABLET | Freq: Every day | ORAL | Status: DC
  Administered 2018-06-01 – 2018-06-02 (×2): 25 mg via ORAL
  Filled 2018-05-31 (×2): qty 1

## 2018-05-31 MED ORDER — DOLUTEGRAVIR SODIUM 50 MG PO TABS
50.0000 mg | ORAL_TABLET | Freq: Every day | ORAL | Status: DC
  Filled 2018-05-31: qty 1

## 2018-05-31 MED ORDER — ENSURE ENLIVE PO LIQD
237.0000 mL | Freq: Two times a day (BID) | ORAL | Status: DC
  Administered 2018-05-31 – 2018-06-02 (×5): 237 mL via ORAL

## 2018-05-31 MED ORDER — INSULIN ASPART 100 UNIT/ML ~~LOC~~ SOLN
0.0000 [IU] | Freq: Three times a day (TID) | SUBCUTANEOUS | Status: DC
  Administered 2018-06-02: 2 [IU] via SUBCUTANEOUS

## 2018-05-31 MED ORDER — LORATADINE 10 MG PO TABS: 10.0000 mg | ORAL_TABLET | Freq: Every day | ORAL | Status: DC | PRN

## 2018-05-31 MED ORDER — DOLUTEGRAVIR SODIUM 50 MG PO TABS
50.0000 mg | ORAL_TABLET | Freq: Every day | ORAL | Status: DC
  Administered 2018-06-01: 50 mg via ORAL
  Filled 2018-05-31 (×3): qty 1

## 2018-05-31 MED ORDER — RILPIVIRINE HCL 25 MG PO TABS
25.0000 mg | ORAL_TABLET | Freq: Every day | ORAL | Status: DC
  Filled 2018-05-31: qty 1

## 2018-05-31 MED ORDER — STUDY - INVESTIGATIONAL MEDICATION
4.0000 mg | Freq: Every day | Status: DC
  Administered 2018-05-31 – 2018-06-02 (×3): 4 mg via ORAL
  Filled 2018-05-31 (×5): qty 1

## 2018-05-31 NOTE — Progress Notes (Signed)
1 Day Post-Op Procedure(s) (LRB): VIDEO ASSISTED THORACOSCOPY (Right) DRAINAGE OF PLEURAL EFFUSION (Right) PLEURAL BIOPSY (Right) TALC PLEURADESIS (Right) INSERTION PLEURAL DRAINAGE CATHETER (Right) Subjective: Some incisional discomfort hungry  Objective: Vital signs in last 24 hours: Temp:  [97.3 F (36.3 C)-97.9 F (36.6 C)] 97.8 F (36.6 C) (12/07 0806) Pulse Rate:  [65-82] 74 (12/06 1700) Cardiac Rhythm: Normal sinus rhythm (12/07 0800) Resp:  [14-30] 22 (12/07 0800) BP: (112-169)/(78-103) 138/85 (12/07 0800) SpO2:  [94 %-100 %] 99 % (12/07 0800) Arterial Line BP: (151-162)/(87-103) 156/87 (12/06 1615) Weight:  [121.3 kg-122 kg] 121.3 kg (12/07 0600)  Hemodynamic parameters for last 24 hours:    Intake/Output from previous day: 12/06 0701 - 12/07 0700 In: 3069 [P.O.:120; I.V.:2180.4; IV Piggyback:693.6] Out: 1010 [Urine:845; Blood:15; Chest Tube:150] Intake/Output this shift: Total I/O In: 200 [I.V.:200] Out: 75 [Urine:75]  General appearance: alert, cooperative and no distress Neurologic: intact Heart: regular rate and rhythm Lungs: equal BS bilaterally Abdomen: normal findings: soft, non-tender  Lab Results: Recent Labs    05/30/18 1431 05/31/18 0403  WBC  --  9.5  HGB 13.9 14.0  HCT 41.0 42.7  PLT  --  183   BMET:  Recent Labs    05/30/18 1431 05/31/18 0403  NA 140 136  K 3.4* 3.9  CL  --  100  CO2  --  25  GLUCOSE  --  168*  BUN  --  15  CREATININE  --  1.45*  CALCIUM  --  9.8    PT/INR:  Recent Labs    05/30/18 1124  LABPROT 13.6  INR 1.05   ABG    Component Value Date/Time   PHART 7.378 05/31/2018 0623   HCO3 25.8 05/31/2018 0623   TCO2 27 05/31/2018 0623   O2SAT 96.0 05/31/2018 0623   CBG (last 3)  Recent Labs    05/30/18 1842 05/31/18 0008 05/31/18 0600  GLUCAP 104* 163* 146*    Assessment/Plan: S/P Procedure(s) (LRB): VIDEO ASSISTED THORACOSCOPY (Right) DRAINAGE OF PLEURAL EFFUSION (Right) PLEURAL BIOPSY  (Right) TALC PLEURADESIS (Right) INSERTION PLEURAL DRAINAGE CATHETER (Right) -Doing well POD # 1 Advance diet IV to KVO CT to water seal CBG elevated on D5 at 100- should improve with decrease D5 SCD + enoxaparin + ambulation for  DVT prophylaxis   LOS: 1 day    Melrose Nakayama 05/31/2018

## 2018-05-31 NOTE — Plan of Care (Signed)

## 2018-05-31 NOTE — Progress Notes (Addendum)
Initial Nutrition Assessment  DOCUMENTATION CODES:   Morbid obesity  INTERVENTION:    Ensure Enlive po BID, each supplement provides 350 kcal and 20 grams of protein  NUTRITION DIAGNOSIS:   Increased nutrient needs related to post-op healing as evidenced by estimated needs  GOAL:   Patient will meet greater than or equal to 90% of their needs  MONITOR:   PO intake, Supplement acceptance, Labs, I & O's, Weight trends, Skin  REASON FOR ASSESSMENT:   Malnutrition Screening Tool    ASSESSMENT:   Patient with PMH significant for breast cancer s/p chemo/radiation, CKD III, HLD, HTN, HIV, PVD, and Bell's Palsy. 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    12/5- drainage right PE with cath insertion, biopsy   Pt endorses having a loss in appetite for two month leading up to admission due to feeling of early satiety. States during this time period she would consume soup with a sandwich for brunch and a meat with vegetables for dinner. It would take her a little more time than usual to eat because she had to wait until fullness feeling passed. She did not use supplementation at home. Her diet has been advanced to regular, she is awaiting her first meal. She is eager to eat. Discussed the importance of protein intake for preservation of lean body mass. Pt amenable to supplementation.   Pt endorses a UBW of 270 lb and a recent wt loss of 11 lb in the last two months. Records indicate pt weighed 276 on 04/25/18 and 267 lb this admission. Unable to determine how much is fluid loss from multiple thoracentesis versus dry wt loss. Nutrition-Focused physical exam completed.   Right chest tube: 150 ml x 24hrs   Medications reviewed and include: fentanyl, SSI, D5 in 1/2NS @ 10 ml/hr, 10 mEq KCl once  Labs reviewed.   NUTRITION - FOCUSED PHYSICAL EXAM:    Most Recent Value  Orbital Region  No depletion  Upper Arm Region  No  depletion  Thoracic and Lumbar Region  No depletion  Buccal Region  No depletion  Temple Region  No depletion  Clavicle Bone Region  No depletion  Clavicle and Acromion Bone Region  No depletion  Scapular Bone Region  No depletion  Dorsal Hand  No depletion  Patellar Region  No depletion  Anterior Thigh Region  No depletion  Posterior Calf Region  No depletion  Edema (RD Assessment)  Mild  Hair  Reviewed  Eyes  Reviewed  Mouth  Reviewed  Skin  Reviewed  Nails  Reviewed     Diet Order:   Diet Order            Diet regular Room service appropriate? Yes; Fluid consistency: Thin  Diet effective now              EDUCATION NEEDS:   Education needs have been addressed  Skin:  Skin Assessment: Skin Integrity Issues: Skin Integrity Issues:: Other (Comment) Other: closed right chest  Last BM:  05/30/18  Height:   Ht Readings from Last 1 Encounters:  05/30/18 5\' 7"  (1.702 m)    Weight:   Wt Readings from Last 1 Encounters:  05/31/18 121.3 kg    Ideal Body Weight:  61.4 kg  BMI:  Body mass index is 41.88 kg/m.  Estimated Nutritional Needs:   Kcal:  1800-2000 kcal   Protein:  90-105 grams  Fluid:  >/= 1.8 L/day  Mariana Single RD, LDN Clinical Nutrition Pager # [phone]

## 2018-05-31 NOTE — Progress Notes (Signed)
      [address]       [place] [address]             [phone]      Comfortable, ambulated earlier  BP (!) 141/98   Pulse 74   Temp 98.2 F (36.8 C) (Oral)   Resp 19   Ht 5\' 7"  (1.702 m)   Wt 121.3 kg   LMP 11/06/2003   SpO2 94%   BMI 41.88 kg/m   Intake/Output Summary (Last 24 hours) at 05/31/2018 1727 Last data filed at 05/31/2018 1200 Gross per 24 hour  Intake 1772.4 ml  Output 865 ml  Net 907.4 ml   Awaiting step down bed  Remo Lipps C. Roxan Hockey, MD [facility] [phone]

## 2018-06-01 ENCOUNTER — Inpatient Hospital Stay (HOSPITAL_COMMUNITY): Payer: Medicare HMO

## 2018-06-01 LAB — COMPREHENSIVE METABOLIC PANEL
ALT: 9 U/L (ref 0–44)
AST: 15 U/L (ref 15–41)
Albumin: 3.1 g/dL — ABNORMAL LOW (ref 3.5–5.0)
Alkaline Phosphatase: 47 U/L (ref 38–126)
Anion gap: 10 (ref 5–15)
BUN: 14 mg/dL (ref 8–23)
CO2: 26 mmol/L (ref 22–32)
Calcium: 9.8 mg/dL (ref 8.9–10.3)
Chloride: 102 mmol/L (ref 98–111)
Creatinine, Ser: 1.27 mg/dL — ABNORMAL HIGH (ref 0.44–1.00)
GFR calc Af Amer: 52 mL/min — ABNORMAL LOW (ref >60)
GFR calc non Af Amer: 45 mL/min — ABNORMAL LOW (ref >60)
Glucose, Bld: 119 mg/dL — ABNORMAL HIGH (ref 70–99)
Potassium: 3.7 mmol/L (ref 3.5–5.1)
Sodium: 138 mmol/L (ref 135–145)
Total Bilirubin: 0.8 mg/dL (ref 0.3–1.2)
Total Protein: 7.1 g/dL (ref 6.5–8.1)

## 2018-06-01 LAB — CBC
HEMATOCRIT: 40 % (ref 36.0–46.0)
HEMOGLOBIN: 12.9 g/dL (ref 12.0–15.0)
MCH: 31.7 pg (ref 26.0–34.0)
MCHC: 32.3 g/dL (ref 30.0–36.0)
MCV: 98.3 fL (ref 80.0–100.0)
Platelets: 194 10*3/uL (ref 150–400)
RBC: 4.07 MIL/uL (ref 3.87–5.11)
RDW: 13.3 % (ref 11.5–15.5)
WBC: 10.7 10*3/uL — ABNORMAL HIGH (ref 4.0–10.5)
nRBC: 0 % (ref 0.0–0.2)

## 2018-06-01 LAB — GLUCOSE, CAPILLARY
Glucose-Capillary: 90 mg/dL (ref 70–99)
Glucose-Capillary: 105 mg/dL — ABNORMAL HIGH (ref 70–99)
Glucose-Capillary: 75 mg/dL (ref 70–99)
Glucose-Capillary: 121 mg/dL — ABNORMAL HIGH (ref 70–99)

## 2018-06-01 MED ORDER — IRBESARTAN 150 MG PO TABS
150.0000 mg | ORAL_TABLET | Freq: Every day | ORAL | Status: DC
  Administered 2018-06-01 – 2018-06-02 (×2): 150 mg via ORAL
  Filled 2018-06-01 (×2): qty 1

## 2018-06-01 MED ORDER — HYDROCHLOROTHIAZIDE 12.5 MG PO CAPS
12.5000 mg | ORAL_CAPSULE | Freq: Every day | ORAL | Status: DC
  Administered 2018-06-01 – 2018-06-02 (×2): 12.5 mg via ORAL
  Filled 2018-06-01 (×2): qty 1

## 2018-06-01 NOTE — Progress Notes (Signed)
Fentanyl  13 mls  Discarded , witnessed by Fabienne Bruns RN.

## 2018-06-01 NOTE — Discharge Summary (Signed)
Physician Discharge Summary       [address]       [place] [address]             [phone]    Patient ID: Kayla Price MRN: [id number] DOB/AGE: [birth year] 62 y.o.  Admit date: 05/30/2018 Discharge date: 06/02/2018  Admission Diagnoses: 1. Recurrent right pleural effusion 2. History of breast cancer  Discharge Diagnoses:  S/p right VATS, drain right pleural effusion, pleural biopsies, TALC, and insertion right Pleur X catheter  . Alopecia areata 11/28/2009  . Bell's palsy   . Cancer [facility]) 2005   Breast cancer   chemotherapy and radiation  . CKD (chronic kidney disease) stage 3, GFR 30-59 ml/min (HCC) 06/08/2015  . Dry eye syndrome   . Family history of lung cancer   . Family history of non-Hodgkin's lymphoma   . Fasting hyperglycemia   . Gestational diabetes    2001  . HIP FRACTURE, RIGHT 05/06/2008  . History of kidney stones   . HIV DISEASE 03/27/2006  . HYPERLIPIDEMIA, MIXED 12/15/2007  . HYPERTENSION 03/27/2006  . 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 MENORRHAGIA, POSTMENOPAUSAL 02/03/2009  . Osteoarthritis of left knee 06/08/2015  . OSTEOARTHROSIS, LOCAL, SCND, UNSPC SITE 04/07/2007  . PVD 04/07/2007  . Tinea capitis   . TRIGGER FINGER 05/06/2008  . Unspecified vitamin D deficiency 08/06/2007   Procedure (s):  Bronchoscopy, right video-assisted thoracoscopy, drainage of pleural fluid, biopsy pleural implants, talc pleurodesis and placement of PleurX catheter. By Dr. Servando Snare on 05/30/2018.  Pathology:Results are pending  History of Presenting Illness: Kayla Price 62 y.o. female is seen in the office   for evaluation of recurrent right pleural effusion with a PET scan suggestive of pleural-based tumor on the right.  Patient has a history of triple negative breast cancer treated first in 2005, with recurrence in 2018.  She notes over the last 10 weeks she began having increasing dyspnea on exertion, orthopnea, and fatigue.   She has had a thoracentesis done on 2 occasions draining 2.5 L once in 2 L the second time.  Attempted biopsy in paratracheal node by EBUS was unsuccessful, cytologies on the pleural fluid were also negative for tumor.   Brief Hospital Course:  The patient remained afebrile and hemodynamically stable. A line and foley were removed early in the post operative course. Chest tube output gradually decreased. Daily chest x rays were obtained and remained stable.  Chest tube was removed on 06/02/2018. Follow up chest x ray showed no pneumothorax, small right pleural effusion. Right pleur X catheter was drained and had 75 cc removed. Per Dr. Servando Snare, right Pleur x catheter to be drained every other day starting Wednesday 06/04/2018. Home health will be arranged to assist with this. Patient has been ambulating on room air. Patient is tolerating a diet and has had a bowel movement. Wounds are clean and dry.  As discussed with Dr. Servando Snare, patient is felt surgically stable for discharge today.   Latest Vital Signs: Blood pressure 101/88, pulse 74, temperature 98.1 F (36.7 C), temperature source Oral, resp. rate (!) 21, height 5\' 7"  (1.702 m), weight 121.3 kg, last menstrual period 11/06/2003, SpO2 100 %.  Physical Exam: Cardiovascular: RRR Pulmonary: Clear to auscultation on the left and slightly diminished right base Wounds: Dressing is clean and dry.     Discharge Condition:Stable and discharged to home.  Recent laboratory studies:  Lab Results  Component Value Date   WBC 10.7 (H) 06/01/2018   HGB  12.9 06/01/2018   HCT 40.0 06/01/2018   MCV 98.3 06/01/2018   PLT 194 06/01/2018   Lab Results  Component Value Date   NA 138 06/02/2018   K 3.9 06/02/2018   CL 103 06/02/2018   CO2 29 06/02/2018   CREATININE 1.38 (H) 06/02/2018   GLUCOSE 106 (H) 06/02/2018      Diagnostic Studies: Dg Chest 2 View  Result Date: 06/02/2018 CLINICAL DATA:  Follow-up right side pneumothorax EXAM: CHEST -  2 VIEW COMPARISON:  06/01/2018 FINDINGS: Right PleurX catheter remains in place, unchanged. Small right pleural effusion. No visible pneumothorax. Interval removal of right central line. Patchy right lung airspace opacities are stable. Heart is borderline in size. Left lung clear. IMPRESSION: Patchy right lung airspace opacities and small right effusion, stable. No pneumothorax. Electronically Signed   By: Rolm Baptise M.D.   On: 06/02/2018 08:39   Dg Chest 2 View  Result Date: 05/30/2018 CLINICAL DATA:  Preoperative examination prior to be ats. Patient reports right-sided chest tightness for the past 2 months but no shortness of breath. History of breast malignancy and right mastectomy in 2018 with thoracentesis ease in October and November. EXAM: CHEST - 2 VIEW COMPARISON:  PA and lateral chest x-ray of May 28, 2018 FINDINGS: There is a stable moderate-sized right pleural effusion with fluid in the minor fissure. There is no pneumothorax. The left lung is clear. The heart and pulmonary vascularity are normal. There is stable mild soft tissue fullness in the lower right paratracheal region which likely reflects lymphadenopathy. The bony thorax exhibits no acute abnormality. IMPRESSION: Stable moderate-sized right pleural effusion. No acute change since the previous study. Electronically Signed   By: David  Martinique M.D.   On: 05/30/2018 11:16   Dg Chest 2 View  Result Date: 05/28/2018 CLINICAL DATA:  62 y/o F; right-sided pleural effusion. History of right breast cancer. EXAM: CHEST - 2 VIEW COMPARISON:  04/28/2018 chest radiograph. 05/01/2018 PET-CT. FINDINGS: Stable cardiac silhouette given projection and technique. The right-sided pleural effusion is mildly increased in comparison with prior chest radiograph. Hypermetabolic pleural-based tumor throughout the right hemithorax is better characterized on the prior PET-CT. Clear left lung. Surgical clips project over the right axilla. Bones are  unremarkable. IMPRESSION: Right-sided pleural effusion is mildly increased in comparison with prior chest radiograph. Hypermetabolic pleural-based tumor throughout the right hemithorax is better characterized on prior PET-CT. Electronically Signed   By: Kristine Garbe M.D.   On: 05/28/2018 17:15   Dg Chest Port 1 View  Result Date: 06/01/2018 CLINICAL DATA:  Right-sided chest tube removal. EXAM: PORTABLE CHEST 1 VIEW COMPARISON:  Earlier today at 0500 hours. FINDINGS: 32. Removal of the more lateral of 2 right-sided chest tubes. The previously described tiny right superolateral pneumothorax is no longer identified. Right internal jugular line is unchanged. Normal heart size. Small volume right-sided pleural fluid and patchy areas of atelectasis remain. The fluid in the right minor fissure is significantly improved. IMPRESSION: Removal of 1 right-sided chest tube, without evidence of residual pneumothorax. Decreased right-sided pleural fluid and improved right-sided atelectasis. Electronically Signed   By: Abigail Miyamoto M.D.   On: 06/01/2018 15:26   Dg Chest Port 1 View  Result Date: 06/01/2018 CLINICAL DATA:  Re-evaluate pleural effusion. EXAM: PORTABLE CHEST 1 VIEW COMPARISON:  1 day prior FINDINGS: Two right chest tubes are unchanged in position. Less than 5% superolateral right pneumothorax remains. Probable trace right pleural air as well. Right internal jugular line tip at low SVC. Mild  cardiomegaly. Volume loss in the right hemithorax with areas of postoperative atelectasis. There is also mild subsegmental atelectasis at the left lung base. IMPRESSION: No significant change since one day prior. Right chest tubes remain in place with approximately 5% superolateral right pneumothorax. Concurrent small volume pleural fluid (i.e. Hydropneumothorax). Electronically Signed   By: Abigail Miyamoto M.D.   On: 06/01/2018 07:53   Dg Chest Port 1 View  Result Date: 05/31/2018 CLINICAL DATA:  S/p  right-sided VATS and talc pleurodesis. Follow-up pneumothorax. EXAM: PORTABLE CHEST 1 VIEW COMPARISON:  05/30/2018 FINDINGS: Right-sided pleural drain and chest tube remains in place, as well as right jugular central venous catheter. A tiny residual right pneumothorax is noted as well as small amount of fluid within the right pleural space. Atelectasis in right mid and lower lung is stable. Increased atelectasis or developing infiltrate seen in the retrocardiac left lower lobe. Heart size remains stable. IMPRESSION: Small residual right hydropneumothorax. New atelectasis or infiltrate in the retrocardiac left lower lobe. Electronically Signed   By: Earle Gell M.D.   On: 05/31/2018 10:07   Dg Chest Port 1 View  Result Date: 05/30/2018 CLINICAL DATA:  Status post RIGHT VATS for pleural effusion drainage, pleural biopsies, talc pleurodesis and pleural drainage catheter placement. EXAM: PORTABLE CHEST 1 VIEW COMPARISON:  05/30/2018 and prior radiographs FINDINGS: RIGHT thoracostomy tubes are noted without pneumothorax. Decreased RIGHT pleural effusion with continued RIGHT LOWER lung opacities/atelectasis. A RIGHT IJ central venous catheter is present with tip overlying the LOWER SVC. The LEFT lung is clear. IMPRESSION: Postoperative changes with support apparatus as described including RIGHT thoracostomy tubes. No pneumothorax. Decreased RIGHT pleural effusion with continued RIGHT LOWER lung opacities/atelectasis. Electronically Signed   By: Margarette Canada M.D.   On: 05/30/2018 16:20     Discharge Medications: Allergies as of 06/02/2018      Reactions   Lisinopril Anaphylaxis, Swelling   Swelling of tongue and mouth 11/05/16- tolerates Olmesartan   Pepcid [famotidine] Other (See Comments)   PPI H2, BLOCKERS LOWER GASTRIC PH WHICH WOULD LEAD TO SUBTHERAPEUTIC RILPIVIRINE LEVELS AND POTENTIAL VIROLOGICAL FAILURE WITH RESISTANCE   Prilosec [omeprazole] Other (See Comments)   PPI H2, BLOCKERS LOWER GASTRIC PH  WHICH WOULD LEAD TO SUBTHERAPEUTIC RILPIVIRINE LEVELS AND POTENTIAL VIROLOGICAL FAILURE WITH RESISTANCE   Tums [calcium Carbonate Antacid] Other (See Comments)   TUMS ANTACIDS CAN LOWER GASTRIC PH WHICH COULD  LEAD TO SUBTHERAPEUTIC RILPIVIRINE LEVELS AND POTENTIAL VIROLOGICAL FAILURE WITH RESISTANCE TUMS CAN BE GIVEN BUT NEED CONSULT WITH [facility] PHARMACY RE TIMING. I PREFER HER TO AVOID ALL TOGETHER      Medication List    TAKE these medications   BENGAY EX Apply 1 application topically daily as needed (muscle pain).   darunavir-cobicistat 800-150 MG tablet Commonly known as:  PREZCOBIX Take 1 tablet by mouth at bedtime. SWALLOW WHOLE. DO NOT CRUSH, BREAK OR CHEW TABLETS. TAKE WITH FOOD.   Dolutegravir-Rilpivirine 50-25 MG Tabs Take 1 tablet by mouth at bedtime. Take with the Prezcobix.   HYDROcodone-acetaminophen 5-325 MG tablet Commonly known as:  NORCO/VICODIN Take one tablet by mouth every 6 hours as needed for moderate pain   hydrocortisone cream 1 % Apply 1 application topically daily as needed for itching.   levothyroxine 150 MCG tablet Commonly known as:  SYNTHROID, LEVOTHROID Take 1 tablet (150 mcg total) by mouth daily before breakfast.   loratadine 10 MG tablet Commonly known as:  CLARITIN Take 10 mg by mouth daily as needed for allergies.   maraviroc  150 MG tablet Commonly known as:  SELZENTRY Take 1 tablet (150 mg total) by mouth 2 (two) times daily.   olmesartan-hydrochlorothiazide 40-25 MG tablet Commonly known as:  BENICAR HCT TAKE 1 TABLET BY MOUTH DAILY.   Pitavastatin Calcium 4 MG Tabs Take 1 tablet (4 mg total) by mouth daily at 12 noon. This may be placebo, study provided. Do not dispense.   REDNESS RELIEF OP Place 1 drop into both eyes daily as needed (redness).       Follow Up Appointments: Follow-up Information    Grace Isaac, MD. Go on 06/19/2018.   Specialty:  Cardiothoracic Surgery Why:  PA/LAT CXR to be taken (at [facility]  which is in the same building as Dr. Everrett Coombe office) on 06/19/2018 at 1:00 pm;Appointment time is at 1:30 pm Contact information: [place] [id number] [phone]           Signed: Sharalyn Ink [facility] 06/02/2018, 1:39 PM

## 2018-06-01 NOTE — Progress Notes (Addendum)
      [address]       [place] [address]             [phone]       2 Days Post-Op Procedure(s) (LRB): VIDEO ASSISTED THORACOSCOPY (Right) DRAINAGE OF PLEURAL EFFUSION (Right) PLEURAL BIOPSY (Right) TALC PLEURADESIS (Right) INSERTION PLEURAL DRAINAGE CATHETER (Right)  Subjective: Patient had large bowel movement after laxative and Ensure yesterday. She has no specific complaint this am. Patient states she needs her BP medication or gets a headache if she does not take in 2-3 days.  Objective: Vital signs in last 24 hours: Temp:  [97.3 F (36.3 C)-98.2 F (36.8 C)] 97.3 F (36.3 C) (12/08 0739) Pulse Rate:  [70-80] 70 (12/08 0500) Cardiac Rhythm: Normal sinus rhythm (12/08 0743) Resp:  [16-25] 16 (12/08 0743) BP: (111-141)/(66-98) 113/66 (12/08 0739) SpO2:  [94 %-98 %] 98 % (12/08 0743) FiO2 (%):  [36 %] 36 % (12/08 0512)     Intake/Output from previous day: 12/07 0701 - 12/08 0700 In: 589.4 [I.V.:589.4] Out: 1390 [Urine:1350; Chest Tube:40]   Physical Exam:  Cardiovascular: RRR Pulmonary: Clear to auscultation on the left and slightly diminished right base Abdomen: Soft, non tender, bowel sounds present. Extremities: Mild bilateral lower extremity edema. Wounds: Dressings are clean and dry.   Chest Tube: to water seal, no air leak  Lab Results: CBC: Recent Labs    05/31/18 0403 06/01/18 0513  WBC 9.5 10.7*  HGB 14.0 12.9  HCT 42.7 40.0  PLT 183 194   BMET:  Recent Labs    05/31/18 0403 06/01/18 0513  NA 136 138  K 3.9 3.7  CL 100 102  CO2 25 26  GLUCOSE 168* 119*  BUN 15 14  CREATININE 1.45* 1.27*  CALCIUM 9.8 9.8    PT/INR:  Recent Labs    05/30/18 1124  LABPROT 13.6  INR 1.05   ABG:  INR: Will add last result for INR, ABG once components are confirmed Will add last 4 CBG results once components are confirmed  Assessment/Plan:  1. CV - SR in the 60-70's. Will restart low dose Benicar. 2.  Pulmonary - On  room air. Chest tube with 40 cc output last 24 hours. Chest tube is to suction and there is no air leak. CXR this am shows stable right apical pneumothorax, atelectasis left base. As discussed with Dr. Roxan Hockey, will remove chest tube. Encourage incentive spirometer. Await final pathology. 3. Will start daily drainage of right Pleur X catheter in am  4. Possible discharge in am  Donielle M ZimmermanPA-C 06/01/2018,8:45 AM [phone]  Patient seen and examined CXR shows some small areas of loculated fluid Minimal drainage from CT- dc CT today and start pleur-x drainage tomorrow  Remo Lipps C. Roxan Hockey, MD [facility] [phone]

## 2018-06-01 NOTE — Op Note (Signed)
NAME: Kayla Price, Kayla Price MEDICAL RECORD QP:[id number] ACCOUNT [id number] DATE OF BIRTH:[birth year] FACILITY: [facility] LOCATION: [place], [place]  OPERATIVE REPORT  DATE OF PROCEDURE:  05/30/2018  PREOPERATIVE DIAGNOSIS:  Recurrent right pleural effusion.  POSTOPERATIVE DIAGNOSIS:  Recurrent right pleural effusion with pleural implants consistent with metastatic breast cancer.  PROCEDURE PERFORMED:  Bronchoscopy, right video-assisted thoracoscopy, drainage of pleural fluid, biopsy pleural implants, talc pleurodesis and placement of PleurX catheter.  SURGEON:  Lanelle Bal, MD  FIRST ASSISTANT:  Lars Pinks, PA.  BRIEF HISTORY:  The patient is a 62 year old female who 10 years previously was diagnosed with breast cancer triple  negative.  Subsequently, a recurrence occurred in the right breast in 2018.  She underwent modified radical mastectomy and has been  followed over the past several months.  She has had increasing shortness of breath and chest x-ray with right pleural effusion.  She has had 2 previous thoracenteses, each time draining between 2 and 2.5 liters of pleural fluid.  So far, the cytologies  on the pleural fluid have been negative.  CT scan and PET scan of the chest was highly suspicious for pleural malignancy and recurrent pleural effusion, both for treatment and to obtain updated biopsy of what appears to be recurrent breast cancer.  We  recommended to the patient proceeding with right video-assisted thoracoscopy, drainage of effusion, pleural biopsies, talc pleurodesis and placement of a PleurX catheter.  She was agreeable and signed informed consent.  DESCRIPTION OF PROCEDURE:  The patient underwent general endotracheal anesthesia with a double lumen endotracheal tube.  Appropriate timeout was performed and then we proceeded with fiberoptic bronchoscopy to the subsegmental level both the right and  left tracheobronchial tree.  The double lumen  endotracheal tube was in good position.  The scope was removed.  The patient was turned in lateral decubitus position with the right side up.  Right chest was prepped with Betadine and draped in the usual  sterile manner.  The right side had preoperatively been marked.  A second timeout was performed.  We then proceeded with fifth intercostal anterior 5 mm incision and port insertion into the chest.  Through this, a 5 mm scope was introduced.  There was a  significant amount of pleural fluid evident and numerous pleural implants.  A second more posterior 5 mm port incision was made and a 5 mm port was introduced into the right chest.  Between the 2 sites, the pleural fluid was evacuated as much as possible  and multiple biopsies of pleural implants were obtained.  The first of these biopsies were sent for frozen section confirming the malignancy.  The remainder of the biopsies were sent to pathology for permanent evaluation.  With the fluid completely  removed and biopsies completed, we then insufflated 2 g of sterile talcum powder throughout the pleural space.  We then under direct vision placed a PleurX catheter after tunneling it subcutaneously and manipulated toward the apex of the lung.  With the  PleurX in good position, the PleurX was secured in place with the Teflon felt just under the skin insertion site.  A 28 Blake drain was placed through the anterior of the 2 incisions.  The posterior wound was closed in the deep layer with a UR 6 Vicryl  suture and a 3-0 subcuticular stitch in the skin edges.  The lung was reinflated.  Dermabond was placed on the incisions.  The PleurX catheter dressing was applied.  The patient was then turned in a  prone position and was extubated in the operating room  and then transferred to the recovery room for postoperative care.  She tolerated the procedure without complication.  Approximately 1200-1300 mL of pleural fluid was removed.  Blood loss was minimal.  Sponge and  needle count was reported as correct at  completion of the procedure. The RF wanding scan was done and reported clear.  TN/NUANCE  D:06/01/2018 T:06/01/2018 JOB:[id number]

## 2018-06-02 ENCOUNTER — Inpatient Hospital Stay (HOSPITAL_COMMUNITY): Payer: Medicare HMO

## 2018-06-02 LAB — BASIC METABOLIC PANEL
Anion gap: 6 (ref 5–15)
BUN: 20 mg/dL (ref 8–23)
CO2: 29 mmol/L (ref 22–32)
Calcium: 9.6 mg/dL (ref 8.9–10.3)
Chloride: 103 mmol/L (ref 98–111)
Creatinine, Ser: 1.38 mg/dL — ABNORMAL HIGH (ref 0.44–1.00)
GFR calc Af Amer: 47 mL/min — ABNORMAL LOW (ref >60)
GFR, EST NON AFRICAN AMERICAN: 41 mL/min — AB (ref >60)
GLUCOSE: 106 mg/dL — AB (ref 70–99)
Potassium: 3.9 mmol/L (ref 3.5–5.1)
Sodium: 138 mmol/L (ref 135–145)

## 2018-06-02 LAB — GLUCOSE, CAPILLARY
Glucose-Capillary: 142 mg/dL — ABNORMAL HIGH (ref 70–99)
Glucose-Capillary: 93 mg/dL (ref 70–99)
Glucose-Capillary: 105 mg/dL — ABNORMAL HIGH (ref 70–99)

## 2018-06-02 MED ORDER — RILPIVIRINE HCL 25 MG PO TABS: 25.0000 mg | ORAL_TABLET | Freq: Every day | ORAL | Status: DC

## 2018-06-02 MED ORDER — DOLUTEGRAVIR SODIUM 50 MG PO TABS
50.0000 mg | ORAL_TABLET | Freq: Every day | ORAL | Status: DC
  Filled 2018-06-02: qty 1

## 2018-06-02 MED ORDER — HYDROCODONE-ACETAMINOPHEN 5-325 MG PO TABS: ORAL_TABLET | ORAL | 0 refills | Status: DC

## 2018-06-02 NOTE — Care Management Note (Addendum)
Case Management Note  Patient Details  Name: Kayla Price MRN: [id number] Date of Birth: 12/24/1955  Subjective/Objective:       Pt is s/p VATS             Action/Plan:   PTA independent from home.  Pt has PCP and denied barriers with obtaining/paying for medications.  Pt will discharge home with plurex - will need home health RN.  Bedside nurse ordered kit for pt to take home. CM provided medicare.gov HH list - pt chose [facility]  Expected Discharge Date:  06/02/18               Expected Discharge Plan:  [place]  In-House Referral:     Discharge planning Services  CM Consult  Post Acute Care Choice:    Choice offered to:     DME Arranged:    DME Agency:     HH Arranged:  RN [place] Agency:  [facility]  Status of Service:  Completed, signed off  If discussed at [place] of Stay Meetings, dates discussed:    Additional Comments: Update:  [facility] informed CM that can not accept pt at this time due to delayed start date.  Pts second choice was [facility] - agency contacted and referral accepted.  CM faxed [employer] forms to [facility].  Original forms given to CMA to be mailed directly to the [place], [place] 06/02/2018, 3:15 PM

## 2018-06-02 NOTE — Progress Notes (Signed)
Per prior RN pleur x was drained around 3 pm today.  Supplies in room.  Notified Md for d/c of central line. Will continue to monitor. Saunders Revel T

## 2018-06-02 NOTE — Discharge Instructions (Signed)
Every other day right Pleurx drainage, starting Wednesday 06/04/2018: If <150 ml Charlsie Quest session x3 consecutive occasions on  QOD drainage schedule- call for office evaluation and possible removal   Thoracoscopy, Care After Refer to this sheet in the next few weeks. These instructions provide you with information about caring for yourself after your procedure. Your health care provider may also give you more specific instructions. Your treatment has been planned according to current medical practices, but problems sometimes occur. Call your health care provider if you have any problems or questions after your procedure. What can I expect after the procedure? After your procedure, it is common to feel sore for up to two weeks. Follow these instructions at home:  There are many different ways to close and cover an incision, including stitches (sutures), skin glue, and adhesive strips. Follow your health care provider's instructions about: ? Incision care. ? Bandage (dressing) changes and removal. ? Incision closure removal.  Check your incision area every day for signs of infection. Watch for: ? Redness, swelling, or pain. ? Fluid, blood, or pus.  Take medicines only as directed by your health care provider.  Try to cough often. Coughing helps to protect against lung infection (pneumonia). It may hurt to cough. If this happens, hold a pillow against your chest when you cough.  Take deep breaths. This also helps to protect against pneumonia.  If you were given an incentive spirometer, use it as directed by your health care provider.  Do not take baths, swim, or use a hot tub until your health care provider approves. You may take showers.  Avoid lifting until your health care provider approves.  Avoid driving until your health care provider approves.  Do not travel by airplane after the chest tube is removed until your health care provider approves. Contact a health care provider  if:  You have a fever.  Pain medicines do not ease your pain.  You have redness, swelling, or increasing pain in your incision area.  You develop a cough that does not go away, or you are coughing up mucus that is yellow or green. Get help right away if:  You have fluid, blood, or pus coming from your incision.  There is a bad smell coming from your incision or dressing.  You develop a rash.  You have difficulty breathing.  You cough up blood.  You develop light-headedness or you feel faint.  You develop chest pain.  Your heartbeat feels irregular or very fast. This information is not intended to replace advice given to you by your health care provider. Make sure you discuss any questions you have with your health care provider. Document Released: 12/29/2004 Document Revised: 02/12/2016 Document Reviewed: 02/24/2014 Elsevier Interactive Patient Education  2018 [employer].

## 2018-06-02 NOTE — Progress Notes (Signed)
Patient discharged home with daughter. Medications and paperwork reviewed, home instructions for Pleurex discussed. Pleurex drains available for home use, home health set up with [facility].

## 2018-06-02 NOTE — Care Management Important Message (Signed)
Important Message  Patient Details  Name: Kayla Price MRN: [id number] Date of Birth: 1956/01/23   Medicare Important Message Given:  Yes    Barb Merino Raeanne Deschler 06/02/2018, 12:54 PM

## 2018-06-02 NOTE — Progress Notes (Signed)
      [address]       [place] [address]             [phone]       3 Days Post-Op Procedure(s) (LRB): VIDEO ASSISTED THORACOSCOPY (Right) DRAINAGE OF PLEURAL EFFUSION (Right) PLEURAL BIOPSY (Right) TALC PLEURADESIS (Right) INSERTION PLEURAL DRAINAGE CATHETER (Right)  Subjective: Patient sitting in chair without complaints.  Objective: Vital signs in last 24 hours: Temp:  [97.7 F (36.5 C)-98.1 F (36.7 C)] 98.1 F (36.7 C) (12/09 1237) Pulse Rate:  [62-91] 74 (12/09 1237) Cardiac Rhythm: Normal sinus rhythm (12/09 1204) Resp:  [17-23] 21 (12/09 1237) BP: (101-133)/(66-91) 101/88 (12/09 1204) SpO2:  [92 %-100 %] 100 % (12/09 1204)     Intake/Output from previous day: 12/08 0701 - 12/09 0700 In: 480 [P.O.:480] Out: 5 [Urine:1; Stool:1]   Physical Exam:  Cardiovascular: RRR Pulmonary: Clear to auscultation on the left and slightly diminished right base Wounds: Dressing is clean and dry.     Lab Results: CBC: Recent Labs    05/31/18 0403 06/01/18 0513  WBC 9.5 10.7*  HGB 14.0 12.9  HCT 42.7 40.0  PLT 183 194   BMET:  Recent Labs    06/01/18 0513 06/02/18 0221  NA 138 138  K 3.7 3.9  CL 102 103  CO2 26 29  GLUCOSE 119* 106*  BUN 14 20  CREATININE 1.27* 1.38*  CALCIUM 9.8 9.6    PT/INR: No results for input(s): LABPROT, INR in the last 72 hours. ABG:  INR: Will add last result for INR, ABG once components are confirmed Will add last 4 CBG results once components are confirmed  Assessment/Plan:  1. CV - SR. On Irbesartan 150 mg and HCTZ 12.5 mg daily. Will resume home dose of Benicar at discharge. 2.  Pulmonary - On room air. CXR this am shows small right pleural effusion and no pneumothorax. Right pleur X drained 75 cc. As discussed with Dr. Servando Snare, will drain right Pleur x every other day starting Wednesday 06/04/2018. Encourage incentive spirometer 3. HH to be arranged then will discharge later today   Jaydian Santana  M ZimmermanPA-C 06/02/2018,1:11 PM [phone]

## 2018-06-03 ENCOUNTER — Other Ambulatory Visit: Payer: Self-pay | Admitting: Oncology

## 2018-06-03 NOTE — Progress Notes (Signed)
Kayla Price pleural biopsy does show carcinoma.  I have requested a prognostic panel and also a foundation 1 and PDL 1 on this material.  She is scheduled to see me again 06/23/2018 and hopefully will have at least some of those results by then.

## 2018-06-04 DIAGNOSIS — G510 Bell's palsy: Secondary | ICD-10-CM | POA: Diagnosis not present

## 2018-06-04 DIAGNOSIS — N183 Chronic kidney disease, stage 3 (moderate): Secondary | ICD-10-CM | POA: Diagnosis not present

## 2018-06-04 DIAGNOSIS — I129 Hypertensive chronic kidney disease with stage 1 through stage 4 chronic kidney disease, or unspecified chronic kidney disease: Secondary | ICD-10-CM | POA: Diagnosis not present

## 2018-06-04 DIAGNOSIS — B20 Human immunodeficiency virus [HIV] disease: Secondary | ICD-10-CM | POA: Diagnosis not present

## 2018-06-04 DIAGNOSIS — H04129 Dry eye syndrome of unspecified lacrimal gland: Secondary | ICD-10-CM | POA: Diagnosis not present

## 2018-06-04 DIAGNOSIS — Z4803 Encounter for change or removal of drains: Secondary | ICD-10-CM | POA: Diagnosis not present

## 2018-06-04 DIAGNOSIS — Z48813 Encounter for surgical aftercare following surgery on the respiratory system: Secondary | ICD-10-CM | POA: Diagnosis not present

## 2018-06-04 DIAGNOSIS — I739 Peripheral vascular disease, unspecified: Secondary | ICD-10-CM | POA: Diagnosis not present

## 2018-06-04 DIAGNOSIS — J90 Pleural effusion, not elsewhere classified: Secondary | ICD-10-CM | POA: Diagnosis not present

## 2018-06-06 DIAGNOSIS — I739 Peripheral vascular disease, unspecified: Secondary | ICD-10-CM | POA: Diagnosis not present

## 2018-06-06 DIAGNOSIS — N183 Chronic kidney disease, stage 3 (moderate): Secondary | ICD-10-CM | POA: Diagnosis not present

## 2018-06-06 DIAGNOSIS — B20 Human immunodeficiency virus [HIV] disease: Secondary | ICD-10-CM | POA: Diagnosis not present

## 2018-06-06 DIAGNOSIS — I129 Hypertensive chronic kidney disease with stage 1 through stage 4 chronic kidney disease, or unspecified chronic kidney disease: Secondary | ICD-10-CM | POA: Diagnosis not present

## 2018-06-06 DIAGNOSIS — Z48813 Encounter for surgical aftercare following surgery on the respiratory system: Secondary | ICD-10-CM | POA: Diagnosis not present

## 2018-06-06 DIAGNOSIS — H04129 Dry eye syndrome of unspecified lacrimal gland: Secondary | ICD-10-CM | POA: Diagnosis not present

## 2018-06-06 DIAGNOSIS — G510 Bell's palsy: Secondary | ICD-10-CM | POA: Diagnosis not present

## 2018-06-06 DIAGNOSIS — Z4803 Encounter for change or removal of drains: Secondary | ICD-10-CM | POA: Diagnosis not present

## 2018-06-06 DIAGNOSIS — J90 Pleural effusion, not elsewhere classified: Secondary | ICD-10-CM | POA: Diagnosis not present

## 2018-06-09 DIAGNOSIS — H04129 Dry eye syndrome of unspecified lacrimal gland: Secondary | ICD-10-CM | POA: Diagnosis not present

## 2018-06-09 DIAGNOSIS — J90 Pleural effusion, not elsewhere classified: Secondary | ICD-10-CM | POA: Diagnosis not present

## 2018-06-09 DIAGNOSIS — Z4803 Encounter for change or removal of drains: Secondary | ICD-10-CM | POA: Diagnosis not present

## 2018-06-09 DIAGNOSIS — I739 Peripheral vascular disease, unspecified: Secondary | ICD-10-CM | POA: Diagnosis not present

## 2018-06-09 DIAGNOSIS — Z48813 Encounter for surgical aftercare following surgery on the respiratory system: Secondary | ICD-10-CM | POA: Diagnosis not present

## 2018-06-09 DIAGNOSIS — I129 Hypertensive chronic kidney disease with stage 1 through stage 4 chronic kidney disease, or unspecified chronic kidney disease: Secondary | ICD-10-CM | POA: Diagnosis not present

## 2018-06-09 DIAGNOSIS — N183 Chronic kidney disease, stage 3 (moderate): Secondary | ICD-10-CM | POA: Diagnosis not present

## 2018-06-09 DIAGNOSIS — B20 Human immunodeficiency virus [HIV] disease: Secondary | ICD-10-CM | POA: Diagnosis not present

## 2018-06-09 DIAGNOSIS — G510 Bell's palsy: Secondary | ICD-10-CM | POA: Diagnosis not present

## 2018-06-11 DIAGNOSIS — G510 Bell's palsy: Secondary | ICD-10-CM | POA: Diagnosis not present

## 2018-06-11 DIAGNOSIS — J90 Pleural effusion, not elsewhere classified: Secondary | ICD-10-CM | POA: Diagnosis not present

## 2018-06-11 DIAGNOSIS — B20 Human immunodeficiency virus [HIV] disease: Secondary | ICD-10-CM | POA: Diagnosis not present

## 2018-06-11 DIAGNOSIS — Z48813 Encounter for surgical aftercare following surgery on the respiratory system: Secondary | ICD-10-CM | POA: Diagnosis not present

## 2018-06-11 DIAGNOSIS — I739 Peripheral vascular disease, unspecified: Secondary | ICD-10-CM | POA: Diagnosis not present

## 2018-06-11 DIAGNOSIS — I129 Hypertensive chronic kidney disease with stage 1 through stage 4 chronic kidney disease, or unspecified chronic kidney disease: Secondary | ICD-10-CM | POA: Diagnosis not present

## 2018-06-11 DIAGNOSIS — N183 Chronic kidney disease, stage 3 (moderate): Secondary | ICD-10-CM | POA: Diagnosis not present

## 2018-06-11 DIAGNOSIS — H04129 Dry eye syndrome of unspecified lacrimal gland: Secondary | ICD-10-CM | POA: Diagnosis not present

## 2018-06-11 DIAGNOSIS — Z4803 Encounter for change or removal of drains: Secondary | ICD-10-CM | POA: Diagnosis not present

## 2018-06-13 ENCOUNTER — Ambulatory Visit (INDEPENDENT_AMBULATORY_CARE_PROVIDER_SITE_OTHER): Payer: Medicare HMO | Admitting: Emergency Medicine

## 2018-06-13 ENCOUNTER — Other Ambulatory Visit: Payer: Self-pay | Admitting: Cardiothoracic Surgery

## 2018-06-13 ENCOUNTER — Encounter: Payer: Self-pay | Admitting: Emergency Medicine

## 2018-06-13 DIAGNOSIS — G510 Bell's palsy: Secondary | ICD-10-CM | POA: Diagnosis not present

## 2018-06-13 DIAGNOSIS — J90 Pleural effusion, not elsewhere classified: Secondary | ICD-10-CM

## 2018-06-13 DIAGNOSIS — N183 Chronic kidney disease, stage 3 (moderate): Secondary | ICD-10-CM | POA: Diagnosis not present

## 2018-06-13 DIAGNOSIS — B20 Human immunodeficiency virus [HIV] disease: Secondary | ICD-10-CM | POA: Diagnosis not present

## 2018-06-13 DIAGNOSIS — I129 Hypertensive chronic kidney disease with stage 1 through stage 4 chronic kidney disease, or unspecified chronic kidney disease: Secondary | ICD-10-CM | POA: Diagnosis not present

## 2018-06-13 DIAGNOSIS — I739 Peripheral vascular disease, unspecified: Secondary | ICD-10-CM | POA: Diagnosis not present

## 2018-06-13 DIAGNOSIS — J910 Malignant pleural effusion: Secondary | ICD-10-CM | POA: Diagnosis not present

## 2018-06-13 DIAGNOSIS — H04129 Dry eye syndrome of unspecified lacrimal gland: Secondary | ICD-10-CM | POA: Diagnosis not present

## 2018-06-13 DIAGNOSIS — Z48813 Encounter for surgical aftercare following surgery on the respiratory system: Secondary | ICD-10-CM | POA: Diagnosis not present

## 2018-06-13 DIAGNOSIS — Z4803 Encounter for change or removal of drains: Secondary | ICD-10-CM | POA: Diagnosis not present

## 2018-06-13 MED FILL — OLMESARTAN-HCTZ 40-25 MG TA: 40-25 | 30 days supply | Qty: 30 | Fill #1

## 2018-06-13 MED FILL — PREZCOBIX 800 MG-150 MG TAB: 800-150 | 30 days supply | Qty: 30 | Fill #2

## 2018-06-13 MED FILL — JULUCA 50-25 MG TAB: 50-25 | 30 days supply | Qty: 30 | Fill #2

## 2018-06-13 MED FILL — SELZENTRY 150 MG TABS: 150 | 30 days supply | Qty: 60 | Fill #11

## 2018-06-13 NOTE — Progress Notes (Signed)
Subjective:    Patient ID: Kayla Price, female    DOB: [birth year], 62 y.o.   MRN: [id number]  HPI 63 year old never smoker with a history of breast cancer (lumpectomy and XRT 2005, mastectomy and adjuvant chemo 2018), HIV on rx, hypertension, mild renal insufficiency, hypothyroidism, obesity.  She has been evaluated in the emergency department in late October for dyspnea, orthopnea and inability to take a deep breath.  She was found to have a right pleural effusion and underwent thoracentesis on 04/14/2018, 2.5 L of yellow fluid, cytology with reactive mesothelial cells but no evidence for malignancy.  A CT scan of the chest 04/22/2018 was reviewed by me, shows some reaccumulation of right pleural fluid, an area of focal pleural thickening versus loculated fluid anteriorly.  I do not see any evidence for this pleural thickening on her most recent prior CT chest 05/17/2017.  She was otherwise completely asymptomatic - no pleuritic pain, no fevers.   Pleural LDH 423, total protein 5.5, WBC 1750 (63% lymphs, 37% monocyte), consistent with an exudate.  Culture negative.   ROV 05/26/18 --Kayla Price is 68 with a history of breast cancer, HIV on therapy, hypertension.  I have seen her for what looks like metastatic right pleural disease with an associated pleural effusion.  A PET 05/02/2018 scan confirms right hilar and scattered right pleural disease that is hypermetabolic.  I performed endobronchial ultrasound and transbronchial biopsies on 11/21 but the cytologies were all negative.  Given high suspicion for malignancy I referred her to see Dr. Servando Snare with thoracic surgery and she underwent VATS with pleurodesis and Pleurx catheter placement.  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 She still has a pleural tube in place, plan is to get it out soon. She is planning to follow with Dr Jana Hakim. She is breathing well, is working with [facility] at home. She has R pleural soreness.     Review of Systems  Constitutional: Negative for fever and unexpected weight change.  HENT: Negative for congestion, dental problem, ear pain, nosebleeds, postnasal drip, rhinorrhea, sinus pressure, sneezing, sore throat and trouble swallowing.   Eyes: Negative for redness and itching.  Respiratory: Negative for cough, chest tightness, shortness of breath and wheezing.   Cardiovascular: Negative for palpitations and leg swelling.  Gastrointestinal: Negative for nausea and vomiting.  Genitourinary: Negative for dysuria.  Musculoskeletal: Negative for joint swelling.  Skin: Negative for rash.  Allergic/Immunologic: Positive for immunocompromised state. Negative for environmental allergies and food allergies.  Neurological: Negative for headaches.  Hematological: Does not bruise/bleed easily.  Psychiatric/Behavioral: Negative for dysphoric mood. The patient is not nervous/anxious.     Past Medical History:  Diagnosis Date  . Alopecia areata 11/28/2009  . Bell's palsy   . Cancer [facility]) 2005   Breast cancer   chemotherapy and radiation  . CKD (chronic kidney disease) stage 3, GFR 30-59 ml/min (HCC) 06/08/2015  . Dry eye syndrome   . Family history of lung cancer   . Family history of non-Hodgkin's lymphoma   . Fasting hyperglycemia   . Gestational diabetes    2001  . HIP FRACTURE, RIGHT 05/06/2008  . History of kidney stones   . HIV DISEASE 03/27/2006  . HYPERLIPIDEMIA, MIXED 12/15/2007  . HYPERTENSION 03/27/2006  . 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 MENORRHAGIA, POSTMENOPAUSAL 02/03/2009  . Osteoarthritis of left knee 06/08/2015  . OSTEOARTHROSIS, LOCAL, SCND, UNSPC SITE 04/07/2007  . PVD 04/07/2007  . Tinea capitis   . TRIGGER FINGER 05/06/2008  .  Unspecified vitamin D deficiency 08/06/2007     Family History  Problem Relation Age of Onset  . Heart attack Brother        Massive MI in 77s  . Stroke Brother        CAD  . Kidney disease Mother   . Stroke Mother   .  Diabetes Mother   . Liver disease Sister   . COPD Sister        had I-131 rx of hyperthyroidism  . Diabetes Sister   . Stroke Sister   . Lung cancer Paternal Uncle        hx smoking  . Cancer Cousin   . Non-Hodgkin's lymphoma Cousin 25       cancer x3, in prostate and lung- unsure if met/spread or if primaries  . Heart failure Father   . Heart disease Father   . Arthritis Father   . Sarcoidosis Sister      Social History   Socioeconomic History  . Marital status: Widowed    Spouse name: Not on file  . Number of children: Not on file  . Years of education: Not on file  . Highest education level: Not on file  Occupational History  . Occupation: Paediatric nurse: UNEMPLOYED  Social Needs  . Financial resource strain: Not hard at all  . Food insecurity:    Worry: Never true    Inability: Never true  . Transportation needs:    Medical: No    Non-medical: No  Tobacco Use  . Smoking status: Never Smoker  . Smokeless tobacco: Never Used  Substance and Sexual Activity  . Alcohol use: No  . Drug use: No  . Sexual activity: Not Currently    Birth control/protection: Post-menopausal    Comment: declined condoms  Lifestyle  . Physical activity:    Days per week: 3 days    Minutes per session: 120 min  . Stress: Not at all  Relationships  . Social connections:    Talks on phone: More than three times a week    Gets together: More than three times a week    Attends religious service: More than 4 times per year    Active member of club or organization: No    Attends meetings of clubs or organizations: Never    Relationship status: Widowed  . Intimate partner violence:    Fear of current or ex partner: No    Emotionally abused: No    Physically abused: No    Forced sexual activity: No  Other Topics Concern  . Not on file  Social History Narrative   Single/widow   Diet: good   Do you drink/eat things with caffeine? Tea occasionally   Marital status: Widowed   What year were you married? 1993   Do you live in a house, apartment,assisted living, condo,trailer,ect.)? House   Is it one or more stories? Two   How many persons live in your home? 4   Do you have any pets in you home? No   Current or past profession: Tour manager   Do you exercise? Yes  Type&how often: Stationary Bike, Water Exercise 3-4 x week   Do you have a living will? Yes   Do you have a DNR form? No  If not do you what one?   Do you have signed POA/HPOA forms? No   If so, please bring to your appointment.  Allergies  Allergen Reactions  . Lisinopril Anaphylaxis and Swelling    Swelling of tongue and mouth 11/05/16- tolerates Olmesartan  . Pepcid [Famotidine] Other (See Comments)    PPI H2, BLOCKERS LOWER GASTRIC PH WHICH WOULD LEAD TO SUBTHERAPEUTIC RILPIVIRINE LEVELS AND POTENTIAL VIROLOGICAL FAILURE WITH RESISTANCE  . Prilosec [Omeprazole] Other (See Comments)    PPI H2, BLOCKERS LOWER GASTRIC PH WHICH WOULD LEAD TO SUBTHERAPEUTIC RILPIVIRINE LEVELS AND POTENTIAL VIROLOGICAL FAILURE WITH RESISTANCE  . Tums [Calcium Carbonate Antacid] Other (See Comments)    TUMS ANTACIDS CAN LOWER GASTRIC PH WHICH COULD  LEAD TO SUBTHERAPEUTIC RILPIVIRINE LEVELS AND POTENTIAL VIROLOGICAL FAILURE WITH RESISTANCE TUMS CAN BE GIVEN BUT NEED CONSULT WITH [facility] PHARM[facility] RE TIMING. I PREFER HER TO AVOID ALL TOGETHER     Outpatient Medications Prior to Visit  Medication Sig Dispense Refill  . darunavir-cobicistat (PREZCOBIX) 800-150 MG tablet Take 1 tablet by mouth at bedtime. SWALLOW WHOLE. DO NOT CRUSH, BREAK OR CHEW TABLETS. TAKE WITH FOOD.    . Dolutegravir-Rilpivirine (JULUCA) 50-25 MG TABS Take 1 tablet by mouth at bedtime. Take with the Prezcobix.    Marland Kitchen HYDROcodone-acetaminophen (NORCO/VICODIN) 5-325 MG tablet Take one tablet by mouth every 6 hours as needed for moderate pain 28 tablet 0  . hydrocortisone cream 1 % Apply 1 application topically daily as needed for itching.      . levothyroxine (SYNTHROID, LEVOTHROID) 150 MCG tablet Take 1 tablet (150 mcg total) by mouth daily before breakfast. 90 tablet 3  . loratadine (CLARITIN) 10 MG tablet Take 10 mg by mouth daily as needed for allergies.     . maraviroc (SELZENTRY) 150 MG tablet Take 1 tablet (150 mg total) by mouth 2 (two) times daily. 60 tablet 11  . Menthol, Topical Analgesic, (BENGAY EX) Apply 1 application topically daily as needed (muscle pain).    . Naphazoline-Glycerin (REDNESS RELIEF OP) Place 1 drop into both eyes daily as needed (redness).    Marland Kitchen olmesartan-hydrochlorothiazide (BENICAR HCT) 40-25 MG tablet TAKE 1 TABLET BY MOUTH DAILY. 30 tablet 1  . Pitavastatin Calcium 4 MG TABS Take 1 tablet (4 mg total) by mouth daily at 12 noon. This may be placebo, study provided. Do not dispense.     No facility-administered medications prior to visit.         Objective:   Physical Exam Vitals:   06/13/18 1413  BP: 126/90  Pulse: 88  SpO2: 94%  Weight: 274 lb (124.3 kg)  Height: _0  (1.702 m)   Gen: Pleasant, overwt, in no distress,  normal affect  ENT: No lesions,  mouth clear,  oropharynx clear, no postnasal drip  Neck: No JVD, no stridor  Lungs: No use of accessory muscles, slightly decreased on the right but better air movement than on previous exam.  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  She has a chest tube in place with minimal drainage, it will probably come out soon.  She is following with Dr. Servando Snare for this.  Her medical regimen for recurrent breast cancer will be  planned by Dr. Jana Hakim and she has follow-up  established with him.  Baltazar Apo, MD, PhD 06/13/2018, 2:31 PM [facility] [phone] or if no answer [phone]

## 2018-06-13 NOTE — Patient Instructions (Signed)
Please follow with Drs. Magrinat and Servando Snare as planned. Follow with Dr. Lamonte Sakai for any changes in your breathing

## 2018-06-13 NOTE — Assessment & Plan Note (Signed)
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  She has a chest tube in place with minimal drainage, it will probably come out soon.  She is following with Dr. Servando Snare for this.  Her medical regimen for recurrent breast cancer will be planned by Dr. Jana Hakim and she has follow-up established with him.

## 2018-06-16 ENCOUNTER — Telehealth: Payer: Self-pay

## 2018-06-16 DIAGNOSIS — H04129 Dry eye syndrome of unspecified lacrimal gland: Secondary | ICD-10-CM | POA: Diagnosis not present

## 2018-06-16 DIAGNOSIS — N183 Chronic kidney disease, stage 3 (moderate): Secondary | ICD-10-CM | POA: Diagnosis not present

## 2018-06-16 DIAGNOSIS — G510 Bell's palsy: Secondary | ICD-10-CM | POA: Diagnosis not present

## 2018-06-16 DIAGNOSIS — B20 Human immunodeficiency virus [HIV] disease: Secondary | ICD-10-CM | POA: Diagnosis not present

## 2018-06-16 DIAGNOSIS — I129 Hypertensive chronic kidney disease with stage 1 through stage 4 chronic kidney disease, or unspecified chronic kidney disease: Secondary | ICD-10-CM | POA: Diagnosis not present

## 2018-06-16 DIAGNOSIS — J90 Pleural effusion, not elsewhere classified: Secondary | ICD-10-CM | POA: Diagnosis not present

## 2018-06-16 DIAGNOSIS — Z4803 Encounter for change or removal of drains: Secondary | ICD-10-CM | POA: Diagnosis not present

## 2018-06-16 DIAGNOSIS — Z48813 Encounter for surgical aftercare following surgery on the respiratory system: Secondary | ICD-10-CM | POA: Diagnosis not present

## 2018-06-16 DIAGNOSIS — I739 Peripheral vascular disease, unspecified: Secondary | ICD-10-CM | POA: Diagnosis not present

## 2018-06-16 NOTE — Telephone Encounter (Signed)
Tommi Rumps, RN with [facility] [phone] contacted the office with requests to change Ms. Sarkisyan's PleruX drainage schedule.  Patient currently drains three times a week and is currently getting less than 5 ml's at each drain.  I advised Tommi Rumps to not drain patient in home until patient is seen in the office this Thursday, 06/19/2018 with Dr. Servando Snare to have patient's drainage schedule further evaluated for a decrease or even removal.  Left a voicemail message with Tommi Rumps.  Will await a return call if warranted.

## 2018-06-17 ENCOUNTER — Encounter (HOSPITAL_COMMUNITY): Payer: Self-pay | Admitting: Oncology

## 2018-06-17 ENCOUNTER — Other Ambulatory Visit: Payer: Self-pay | Admitting: Nurse Practitioner

## 2018-06-17 DIAGNOSIS — M1990 Unspecified osteoarthritis, unspecified site: Secondary | ICD-10-CM

## 2018-06-19 ENCOUNTER — Ambulatory Visit (INDEPENDENT_AMBULATORY_CARE_PROVIDER_SITE_OTHER): Payer: Self-pay | Admitting: Cardiothoracic Surgery

## 2018-06-19 ENCOUNTER — Other Ambulatory Visit: Payer: Self-pay

## 2018-06-19 ENCOUNTER — Encounter: Payer: Self-pay | Admitting: Cardiothoracic Surgery

## 2018-06-19 ENCOUNTER — Ambulatory Visit
Admission: RE | Admit: 2018-06-19 | Discharge: 2018-06-19 | Disposition: A | Discharge status: Home / Self Care | Payer: Medicare HMO | Source: Ambulatory Visit | Attending: Cardiothoracic Surgery | Admitting: Cardiothoracic Surgery

## 2018-06-19 VITALS — BP 136/80 | HR 76 | Resp 16 | Ht 67.0 in | Wt 268.6 lb

## 2018-06-19 DIAGNOSIS — J90 Pleural effusion, not elsewhere classified: Secondary | ICD-10-CM

## 2018-06-19 DIAGNOSIS — C50911 Malignant neoplasm of unspecified site of right female breast: Secondary | ICD-10-CM | POA: Diagnosis not present

## 2018-06-19 DIAGNOSIS — Z9689 Presence of other specified functional implants: Secondary | ICD-10-CM

## 2018-06-19 DIAGNOSIS — Z09 Encounter for follow-up examination after completed treatment for conditions other than malignant neoplasm: Secondary | ICD-10-CM

## 2018-06-19 DIAGNOSIS — C3411 Malignant neoplasm of upper lobe, right bronchus or lung: Secondary | ICD-10-CM

## 2018-06-19 MED FILL — HYDROCODON-APAP 5-325: 5-325 | 15 days supply | Qty: 60 | Fill #0

## 2018-06-19 NOTE — Telephone Encounter (Signed)
Last filled 05/07/2018 Database checked and verified

## 2018-06-19 NOTE — Progress Notes (Signed)
[address]       [place] [address]             [id number]      Ayo H Capell [facility] Medical Record #[id number] Date of Birth: June 22, 1956  Referring: Wilber Bihari Cornett* Primary Care: Lauree Chandler, NP Primary Cardiologist: No primary care provider on file.   Chief Complaint:   POST OP FOLLOW UP DATE OF PROCEDURE:  05/30/2018 PREOPERATIVE DIAGNOSIS:  Recurrent right pleural effusion. POSTOPERATIVE DIAGNOSIS:  Recurrent right pleural effusion with pleural implants consistent with metastatic breast cancer. PROCEDURE PERFORMED:  Bronchoscopy, right video-assisted thoracoscopy, drainage of pleural fluid, biopsy pleural implants, talc pleurodesis and placement of PleurX catheter. SURGEON:  Lanelle Bal, MD Diagnosis 1. Pleura, biopsy, Right - CARCINOMA. 2. Pleura, biopsy, Right - CARCINOMA. - SEE COMMENT.  History of Present Illness:     Patient returns to the office today in follow-up after placement of her right Pleurx catheter and right video-assisted thoracoscopy with talc pleurodesis and biopsy of pleural implants. The patient Pleurx catheter has drained very little over the past week.      Past Medical History:  Diagnosis Date  . Alopecia areata 11/28/2009  . Bell's palsy   . Cancer [facility]) 2005   Breast cancer   chemotherapy and radiation  . CKD (chronic kidney disease) stage 3, GFR 30-59 ml/min (HCC) 06/08/2015  . Dry eye syndrome   . Family history of lung cancer   . Family history of non-Hodgkin's lymphoma   . Fasting hyperglycemia   . Gestational diabetes    2001  . HIP FRACTURE, RIGHT 05/06/2008  . History of kidney stones   . HIV DISEASE 03/27/2006  . HYPERLIPIDEMIA, MIXED 12/15/2007  . HYPERTENSION 03/27/2006  . 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 MENORRHAGIA, POSTMENOPAUSAL 02/03/2009  . Osteoarthritis of left knee 06/08/2015  . OSTEOARTHROSIS, LOCAL, SCND, UNSPC SITE 04/07/2007  . PVD 04/07/2007  .  Tinea capitis   . TRIGGER FINGER 05/06/2008  . Unspecified vitamin D deficiency 08/06/2007     Social History   Tobacco Use  Smoking Status Never Smoker  Smokeless Tobacco Never Used    Social History   Substance and Sexual Activity  Alcohol Use No     Allergies  Allergen Reactions  . Lisinopril Anaphylaxis and Swelling    Swelling of tongue and mouth 11/05/16- tolerates Olmesartan  . Pepcid [Famotidine] Other (See Comments)    PPI H2, BLOCKERS LOWER GASTRIC PH WHICH WOULD LEAD TO SUBTHERAPEUTIC RILPIVIRINE LEVELS AND POTENTIAL VIROLOGICAL FAILURE WITH RESISTANCE  . Prilosec [Omeprazole] Other (See Comments)    PPI H2, BLOCKERS LOWER GASTRIC PH WHICH WOULD LEAD TO SUBTHERAPEUTIC RILPIVIRINE LEVELS AND POTENTIAL VIROLOGICAL FAILURE WITH RESISTANCE  . Tums [Calcium Carbonate Antacid] Other (See Comments)    TUMS ANTACIDS CAN LOWER GASTRIC PH WHICH COULD  LEAD TO SUBTHERAPEUTIC RILPIVIRINE LEVELS AND POTENTIAL VIROLOGICAL FAILURE WITH RESISTANCE TUMS CAN BE GIVEN BUT NEED CONSULT WITH [facility] PHARM[facility] RE TIMING. I PREFER HER TO AVOID ALL TOGETHER    Current Outpatient Medications  Medication Sig Dispense Refill  . darunavir-cobicistat (PREZCOBIX) 800-150 MG tablet Take 1 tablet by mouth at bedtime. SWALLOW WHOLE. DO NOT CRUSH, BREAK OR CHEW TABLETS. TAKE WITH FOOD.    . Dolutegravir-Rilpivirine (JULUCA) 50-25 MG TABS Take 1 tablet by mouth at bedtime. Take with the Prezcobix.    Marland Kitchen HYDROcodone-acetaminophen (NORCO/VICODIN) 5-325 MG tablet TAKE ONE TABLET BY MOUTH EVERY 6 HOURS AS NEEDED FOR MODERATE PAIN 60 tablet  0  . hydrocortisone cream 1 % Apply 1 application topically daily as needed for itching.    . levothyroxine (SYNTHROID, LEVOTHROID) 150 MCG tablet Take 1 tablet (150 mcg total) by mouth daily before breakfast. 90 tablet 3  . loratadine (CLARITIN) 10 MG tablet Take 10 mg by mouth daily as needed for allergies.     . maraviroc (SELZENTRY) 150 MG tablet Take 1 tablet (150 mg  total) by mouth 2 (two) times daily. 60 tablet 11  . Menthol, Topical Analgesic, (BENGAY EX) Apply 1 application topically daily as needed (muscle pain).    . Naphazoline-Glycerin (REDNESS RELIEF OP) Place 1 drop into both eyes daily as needed (redness).    Marland Kitchen olmesartan-hydrochlorothiazide (BENICAR HCT) 40-25 MG tablet TAKE 1 TABLET BY MOUTH DAILY. 30 tablet 1  . Pitavastatin Calcium 4 MG TABS Take 1 tablet (4 mg total) by mouth daily at 12 noon. This may be placebo, study provided. Do not dispense.     No current facility-administered medications for this visit.        Physical Exam: BP 136/80 (BP Location: Left Arm, Patient Position: Sitting, Cuff Size: Large)   Pulse 76   Resp 16   Ht '5\' 7"'  (1.702 m)   Wt 268 lb 9.6 oz (121.8 kg)   LMP 11/06/2003   SpO2 96% Comment: ON RA  BMI 42.07 kg/m   General appearance: alert and cooperative Neurologic: intact Heart: regular rate and rhythm, S1, S2 normal, no murmur, click, rub or gallop Lungs: clear to auscultation bilaterally Abdomen: soft, non-tender; bowel sounds normal; no masses,  no organomegaly Extremities: extremities normal, atraumatic, no cyanosis or edema and Homans sign is negative, no sign of DVT Wound: Patient's Pleurx was drained in office today with very minimal drainage  The patient's entry site for the Pleurx catheter was sterilely cleaned with Betadine 3 cc of 1% lidocaine was infiltrated into the area, the cuff and Pleurx catheter was easily freed and removed without difficulty in its entirety dry dressing was applied. Patient was instructed in local wound care for the next several days   Diagnostic Studies & Laboratory data:     Recent Radiology Findings:   Dg Chest 2 View  Result Date: 06/19/2018 CLINICAL DATA:  Recurrent right pleural effusion. EXAM: CHEST - 2 VIEW COMPARISON:  06/02/2018 FINDINGS: PleurX catheter remains in place, unchanged. Small right pleural effusion with right mid and lower lung airspace  opacities, similar prior study. No pneumothorax. Left lung clear. Mild cardiomegaly. IMPRESSION: Stable airspace opacities and small effusion on the right. No pneumothorax. Mild cardiomegaly. Electronically Signed   By: Rolm Baptise M.D.   On: 06/19/2018 13:13   I have independently reviewed the above radiology studies  and reviewed the findings with the patient.   Recent Lab Findings: Lab Results  Component Value Date   WBC 10.7 (H) 06/01/2018   HGB 12.9 06/01/2018   HCT 40.0 06/01/2018   PLT 194 06/01/2018   GLUCOSE 106 (H) 06/02/2018   CHOL 199 12/25/2017   TRIG 115 12/25/2017   HDL 60 12/25/2017   LDLCALC 116 (H) 12/25/2017   ALT 9 06/01/2018   AST 15 06/01/2018   NA 138 06/02/2018   K 3.9 06/02/2018   CL 103 06/02/2018   CREATININE 1.38 (H) 06/02/2018   BUN 20 06/02/2018   CO2 29 06/02/2018   TSH 9.72 (H) 04/24/2018   INR 1.05 05/30/2018   HGBA1C 5.3 04/18/2018      Assessment / Plan:  Patient doing well following recent right video-assisted thoracoscopy drainage of recurrent pleural effusion with biopsy of pleural implants, carcinomas confirmed tissues have been checked for markers including foundation 1 PDL 1, HER-2.  Patient is to see Dr. Jana Hakim in several days to discuss further treatment options. Pleurx removed today in the office without difficulty Plan to see the patient back in 3 to 4 weeks with a follow-up chest x-ray      Grace Isaac MD      [address] [place] [address] Office [phone]   Beeper [phone]  06/19/2018 2:21 PM

## 2018-06-21 NOTE — Progress Notes (Signed)
cancer or not.  She is going to need [facility]  Telephone:([phone]) [phone] [phone]) [phone]     ID: Kayla Price DOB: 1961-02-04  MR#: [id number]  YPP#:[id number]  Patient Care Team: Sharon Seller, NP as PCP - General (Nurse Practitioner) Daiva Eves, Lisette Grinder, MD as PCP - Infectious Diseases (Infectious Diseases) Ernesto Rutherford, MD as Consulting Physician (Ophthalmology) Romero Belling, MD as Consulting Physician (Endocrinology) Gean Birchwood, MD as Consulting Physician (Orthopedic Surgery) Danilyn Cocke, Valentino Hue, MD as Consulting Physician (Oncology) Claud Kelp, MD as Consulting Physician (General Surgery) Reva Bores, MD as Consulting Physician (Obstetrics and Gynecology) Axel Filler, Larna Daughters, NP as Nurse Practitioner (Hematology and Oncology) OTHER MD:  CHIEF COMPLAINT: Triple negative breast cancer, recurrent  CURRENT TREATMENT: Pembrolizumab  INTERVAL HISTORY: [name] returns today for follow-up of her recurrent triple negative breast cancer.   The patient continues under observation.   Since her last visit here on 05/26/2018 with NP Lillard Anes, she underwent a chest xray on 05/28/2018 showing stable cardiac silhouette given projection and technique. The right-sided pleural effusion is mildly increased in comparison with prior chest radiograph. Hypermetabolic pleural-based tumor throughout the right hemithorax is better characterized on the prior PET-CT. Clear left lung. Surgical clips project over the right axilla. Bones are unremarkable.  A repeat chest xray on 05/30/2018 shows that there is a stable moderate-sized right pleural effusion with fluid in the minor fissure. There is no pneumothorax. The left lung is clear. The heart and pulmonary vascularity are normal. There is stable mild soft tissue fullness in the lower right paratracheal region which likely reflects lymphadenopathy. The bony thorax exhibits no acute abnormality.  She was  admitted on 05/30/2018 and underwent a video assisted thoracoscopy. The pathology from this procedure showed ([id number]): a carcinoma involving the right pleura. Prognostic indicators significant for: estrogen receptor, 0% negative and progesterone receptor, 0% negative. HER2 negative by immunohistochemistry (0). There is also a second right pleura carcinoma in which the malignant cells are positive for cytokeratin 5/6 and focally positive for p63. They are negative for CD56, napsin, TTF-1, GATA-3, and GCDFP. This immunohistochemical profile is non-specific, but given the patient's history and the radiologic findings, primary breast carcinoma is favored.  A repeat chest xray is performed on 06/02/2018 showing right PleurX catheter remains in place, unchanged. Small right pleural effusion. No visible pneumothorax. Interval removal of right central line. Patchy right lung airspace opacities are stable. Heart is borderline in size. Left lung clear.  A repeat chest xray is performed on 06/19/2018 showing PleurX catheter remains in place, unchanged. Small right pleural effusion with right mid and lower lung airspace opacities, similar prior study. No pneumothorax. Left lung clear. Mild cardiomegaly.  Finally, PD-L1 testing was completed with [facility] on the biopsy from 05/30/2018 showing the tumor infiltrating immune cells to be 1% positive. There is no microsatellite instability, and a low tumor mutational burden at 5 mutations for megabase.   REVIEW OF SYSTEMS: For Christmas, Latiffany kept things "low key." Her leg is stiff from her knee surgery; she states that she laid wrong at night, which she attributes the stiffness to. She has regained some feeling in her armpit area. She has had less appetite, but no nausea. Her shortness of breath has resolved since the last visit. The patient denies unusual headaches, visual changes, vomiting, or dizziness. There has been no unusual cough, phlegm  production, or pleurisy. This been no change in bowel or bladder habits. The patient denies unexplained fatigue or  unexplained weight loss, bleeding, rash, or fever. A detailed review of systems was otherwise noncontributory.    BREAST CANCER HISTORY: From the original intake note:  [name] is a history of right-sided breast cancer dating back to 2005. She had a lumpectomy with sentinel lymph node sampling, chemotherapy, and radiation. I do not have access to those records at present.  More recently she had bilateral [facility] 09[facility]2016 showing a possible mass in the right breast. Diagnostic mammography with ultrasonography on 09/28/2016 the patient underwent right diagnostic mammography with tomography and right breast ultrasonography. The breast density was category A. In the right breast at the 10:00 position there was an irregular mass measuring 2.5 cm. Ultrasound identified this the 10:00 radiant 10 cm from the nipple measuring 2.4 cm. In the right axilla there was an abnormal lymph node measuring 1.3 cm with other normal-appearing lymph nodes.  On 10/01/2016 she  underwent biopsy of the right breast mass in question as well as the suspicious axillary lymph node. 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 The proliferation marker was 70% in the breast lesion and 50% in the lymph node.  Her subsequent history is as detailed below.   PAST MEDICAL HISTORY: Past Medical History:  Diagnosis Date  . Alopecia areata 11/28/2009  . Bell's palsy   . Cancer [facility]) 2005   Breast cancer   chemotherapy and radiation  . CKD (chronic kidney disease) stage 3, GFR 30-59 ml/min (HCC) 06/08/2015  . Dry eye syndrome   . Family history of lung cancer   . Family history of non-Hodgkin's lymphoma   . Fasting hyperglycemia   .  Gestational diabetes    2001  . HIP FRACTURE, RIGHT 05/06/2008  . History of kidney stones   . HIV DISEASE 03/27/2006  . HYPERLIPIDEMIA, MIXED 12/15/2007  . HYPERTENSION 03/27/2006  . 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 MENORRHAGIA, POSTMENOPAUSAL 02/03/2009  . Osteoarthritis of left knee 06/08/2015  . OSTEOARTHROSIS, LOCAL, SCND, UNSPC SITE 04/07/2007  . PVD 04/07/2007  . Tinea capitis   . TRIGGER FINGER 05/06/2008  . Unspecified vitamin D deficiency 08/06/2007    PAST SURGICAL HISTORY: Past Surgical History:  Procedure Laterality Date  . BREAST LUMPECTOMY Right    2005  . CHEST TUBE INSERTION Right 05/30/2018   Procedure: INSERTION PLEURAL DRAINAGE CATHETER;  Surgeon: Delight Ovens, MD;  Location: [facility] OR;  Service: Thoracic;  Laterality: Right;  . COLONOSCOPY    . COLONOSCOPY WITH ESOPHAGOGASTRODUODENOSCOPY (EGD)    . ENDOBRONCHIAL ULTRASOUND Bilateral 05/15/2018   Procedure: ENDOBRONCHIAL ULTRASOUND;  Surgeon: Leslye Peer, MD;  Location: [facility] ENDOSCOPY;  Service: Cardiopulmonary;  Laterality: Bilateral;  . FINE NEEDLE ASPIRATION BIOPSY  05/15/2018   Procedure: FINE NEEDLE ASPIRATION BIOPSY;  Surgeon: Leslye Peer, MD;  Location: [facility] ENDOSCOPY;  Service: Cardiopulmonary;;  . FLEXIBLE BRONCHOSCOPY  05/15/2018   Procedure: FLEXIBLE BRONCHOSCOPY;  Surgeon: Leslye Peer, MD;  Location: [facility] ENDOSCOPY;  Service: Cardiopulmonary;;  . HYSTEROSCOPY  2006  . IR THORACENTESIS ASP PLEURAL SPACE W/IMG GUIDE  04/14/2018  . IR THORACENTESIS ASP PLEURAL SPACE W/IMG GUIDE  04/28/2018  . KNEE ARTHROSCOPY Left 2002  . LYMPH NODE DISSECTION  2005  . MASTECTOMY MODIFIED RADICAL Right 11/05/2016   Procedure: RIGHT MASTECTOMY MODIFIED RADICAL;  Surgeon: Claud Kelp, MD;  Location: [facility] OR;  Service: General;  Laterality: Right;  . MODIFIED RADICAL  MASTECTOMY Right 11/05/2016  . placement   of port-a-cath    . PLEURAL BIOPSY Right 05/30/2018   Procedure: PLEURAL BIOPSY;  Surgeon:  Delight Ovens, MD;  Location: [facility] OR;  Service: Thoracic;  Laterality: Right;  . PLEURAL EFFUSION DRAINAGE Right 05/30/2018   Procedure: DRAINAGE OF PLEURAL EFFUSION;  Surgeon: Delight Ovens, MD;  Location[facility] OR;  Service: Thoracic;  Laterality: Right;  . PORT-A-CATH REMOVAL  2006   insertion 2005  . PORT-A-CATH REMOVAL N/A 03/29/2017   Procedure: REMOVAL PORT-A-CATH;  Surgeon: Claud Kelp, MD;  Location: [facility] OR;  Service: General;  Laterality: N/A;  . PORTACATH PLACEMENT Right 11/05/2016   Procedure: INSERTION PORT-A-CATH WITH ULTRA SOUND;  Surgeon: Claud Kelp, MD;  Location[facility] OR;  Service: General;  Laterality: Right;  . removal of port a cath  12/2014  . TALC PLEURODESIS Right 05/30/2018   Procedure: Lurlean Nanny;  Surgeon: Delight Ovens, MD;  Location: [facility] OR;  Service: Thoracic;  Laterality: Right;  . THYROID SURGERY  2009   Ablation   . TOTAL KNEE ARTHROPLASTY Left 02/06/2016   Procedure: TOTAL KNEE ARTHROPLASTY;  Surgeon: Gean Birchwood, MD;  Location: [facility] OR;  Service: Orthopedics;  Laterality: Left;  . TUBAL LIGATION    . VIDEO ASSISTED THORACOSCOPY Right 05/30/2018   Procedure: VIDEO ASSISTED THORACOSCOPY;  Surgeon: Delight Ovens, MD;  Location: [facility] OR;  Service: Thoracic;  Laterality: Right;    FAMILY HISTORY Family History  Problem Relation Age of Onset  . Heart attack Brother        Massive MI in 59s  . Stroke Brother        CAD  . Kidney disease Mother   . Stroke Mother   . Diabetes Mother   . Liver disease Sister   . COPD Sister        had I-131 rx of hyperthyroidism  . Diabetes Sister   . Stroke Sister   . Lung cancer Paternal Uncle        hx smoking  . Cancer Cousin   . Non-Hodgkin's lymphoma Cousin 25       cancer x3, in prostate and lung- unsure if met/spread or if primaries  . Heart failure Father   . Heart disease Father   . Arthritis Father   . Sarcoidosis Sister   The patient's father died at the age of 4 in the setting of  Alzheimer's disease. The patient's mother died at the age of 71 from complications of diabetes. The patient has 3 brothers, 4 sisters. There is no history of breast or ovarian cancer in the family area   GYNECOLOGIC HISTORY:  Patient's last menstrual period was 11/06/2003.  menarche age 22, first live birth age 4, the patient is GX P2. She stopped having periods in 2005, with her chemotherapy; she never took hormone replacement   SOCIAL HISTORY:  Reyanne worked for the [employer] more than 20 years. 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 At home she lives with her son [name], her daughter [name] who works for [employer] and her granddaughter [name], 29 y/o AS OF APRIL 2018     ADVANCED DIRECTIVES:  not in place    HEALTH MAINTENANCE: Social History   Tobacco Use  . Smoking status: Never Smoker  . Smokeless tobacco: Never Used  Substance Use Topics  . Alcohol use: No  . Drug use: No     Colonoscopy:  PAP:  Bone  density:   Allergies  Allergen Reactions  . Lisinopril Anaphylaxis and Swelling    Swelling of tongue and mouth 11/05/16- tolerates Olmesartan  . Pepcid [Famotidine] Other (See Comments)    PPI H2, BLOCKERS LOWER GASTRIC PH WHICH WOULD LEAD TO SUBTHERAPEUTIC RILPIVIRINE LEVELS AND POTENTIAL VIROLOGICAL FAILURE WITH RESISTANCE  . Prilosec [Omeprazole] Other (See Comments)    PPI H2, BLOCKERS LOWER GASTRIC PH WHICH WOULD LEAD TO SUBTHERAPEUTIC RILPIVIRINE LEVELS AND POTENTIAL VIROLOGICAL FAILURE WITH RESISTANCE  . Tums [Calcium Carbonate Antacid] Other (See Comments)    TUMS ANTACIDS CAN LOWER GASTRIC PH WHICH COULD  LEAD TO SUBTHERAPEUTIC RILPIVIRINE LEVELS AND POTENTIAL VIROLOGICAL FAILURE WITH RESISTANCE TUMS CAN BE GIVEN BUT NEED CONSULT WITH [facility]ARM[facility] RE TIMING. I PREFER HER TO AVOID ALL TOGETHER    Current Outpatient Medications  Medication Sig Dispense Refill  . darunavir-cobicistat (PREZCOBIX) 800-150  MG tablet Take 1 tablet by mouth at bedtime. SWALLOW WHOLE. DO NOT CRUSH, BREAK OR CHEW TABLETS. TAKE WITH FOOD.    . Dolutegravir-Rilpivirine (JULUCA) 50-25 MG TABS Take 1 tablet by mouth at bedtime. Take with the Prezcobix.    Marland Kitchen HYDROcodone-acetaminophen (NORCO/VICODIN) 5-325 MG tablet TAKE ONE TABLET BY MOUTH EVERY 6 HOURS AS NEEDED FOR MODERATE PAIN 60 tablet 0  . hydrocortisone cream 1 % Apply 1 application topically daily as needed for itching.    . levothyroxine (SYNTHROID, LEVOTHROID) 150 MCG tablet Take 1 tablet (150 mcg total) by mouth daily before breakfast. 90 tablet 3  . loratadine (CLARITIN) 10 MG tablet Take 10 mg by mouth daily as needed for allergies.     . maraviroc (SELZENTRY) 150 MG tablet Take 1 tablet (150 mg total) by mouth 2 (two) times daily. 60 tablet 11  . Menthol, Topical Analgesic, (BENGAY EX) Apply 1 application topically daily as needed (muscle pain).    . Naphazoline-Glycerin (REDNESS RELIEF OP) Place 1 drop into both eyes daily as needed (redness).    Marland Kitchen olmesartan-hydrochlorothiazide (BENICAR HCT) 40-25 MG tablet TAKE 1 TABLET BY MOUTH DAILY. 30 tablet 1  . Pitavastatin Calcium 4 MG TABS Take 1 tablet (4 mg total) by mouth daily at 12 noon. This may be placebo, study provided. Do not dispense.     No current facility-administered medications for this visit.     OBJECTIVE: Really obese African-American woman who appears stated age Vitals:   06/23/18 1110  BP: 126/85  Pulse: 79  Resp: 18  Temp: 97.9 F (36.6 C)  SpO2: 99%     Body mass index is 42.41 kg/m.    Filed Weights   06/23/18 1110  Weight: 270 lb 12.8 oz (122.8 kg)  ECOG FS: 1  Sclerae unicteric, EOMs intact Oropharynx clear and moist No cervical or supraclavicular adenopathy Lungs no rales or rhonchi Heart regular rate and rhythm Abd soft, nontender, positive bowel sounds MSK no focal spinal tenderness, no upper extremity lymphedema Neuro: nonfocal, well oriented, appropriate  affect Breasts: Deferred Skin: The drainage catheter exit site in the right lower lateral chest is healing nicely, without erythema or swelling   LAB RESULTS:  CMP     Component Value Date/Time   NA 138 06/02/2018 0221   NA 140 05/13/2017 0927   K 3.9 06/02/2018 0221   K 4.1 05/13/2017 0927   CL 103 06/02/2018 0221   CO2 29 06/02/2018 0221   CO2 29 05/13/2017 0927   GLUCOSE 106 (H) 06/02/2018 0221   GLUCOSE 102 05/13/2017 0927   BUN 20 06/02/2018 0221   BUN  17.7 05/13/2017 0927   CREATININE 1.38 (H) 06/02/2018 0221   CREATININE 1.58 (H) 05/26/2018 0935   CREATININE 1.48 (H) 04/18/2018 0932   CREATININE 1.4 (H) 05/13/2017 0927   CALCIUM 9.6 06/02/2018 0221   CALCIUM 10.5 (H) 05/13/2017 0927   PROT 7.1 06/01/2018 0513   PROT 8.1 05/13/2017 0927   ALBUMIN 3.1 (L) 06/01/2018 0513   ALBUMIN 3.5 05/13/2017 0927   AST 15 06/01/2018 0513   AST 13 (L) 05/26/2018 0935   AST 14 05/13/2017 0927   ALT 9 06/01/2018 0513   ALT 10 05/26/2018 0935   ALT 12 05/13/2017 0927   ALKPHOS 47 06/01/2018 0513   ALKPHOS 90 05/13/2017 0927   BILITOT 0.8 06/01/2018 0513   BILITOT 0.6 05/26/2018 0935   BILITOT 0.27 05/13/2017 0927   GFRNONAA 41 (L) 06/02/2018 0221   GFRNONAA 35 (L) 05/26/2018 0935   GFRNONAA 38 (L) 04/18/2018 0932   GFRAA 47 (L) 06/02/2018 0221   GFRAA 40 (L) 05/26/2018 0935   GFRAA 44 (L) 04/18/2018 0932    No results found for: TOTALPROTELP, ALBUMINELP, A1GS, A2GS, BETS, BETA2SER, GAMS, MSPIKE, SPEI  No results found for: Ron Parker, [facility]  Lab Results  Component Value Date   WBC 10.7 (H) 06/01/2018   NEUTROABS 2.8 05/26/2018   HGB 12.9 06/01/2018   HCT 40.0 06/01/2018   MCV 98.3 06/01/2018   PLT 194 06/01/2018      Chemistry      Component Value Date/Time   NA 138 06/02/2018 0221   NA 140 05/13/2017 0927   K 3.9 06/02/2018 0221   K 4.1 05/13/2017 0927   CL 103 06/02/2018 0221   CO2 29 06/02/2018 0221   CO2 29 05/13/2017 0927   BUN 20  06/02/2018 0221   BUN 17.7 05/13/2017 0927   CREATININE 1.38 (H) 06/02/2018 0221   CREATININE 1.58 (H) 05/26/2018 0935   CREATININE 1.48 (H) 04/18/2018 0932   CREATININE 1.4 (H) 05/13/2017 0927   GLU 104 02/13/2016 1358      Component Value Date/Time   CALCIUM 9.6 06/02/2018 0221   CALCIUM 10.5 (H) 05/13/2017 0927   ALKPHOS 47 06/01/2018 0513   ALKPHOS 90 05/13/2017 0927   AST 15 06/01/2018 0513   AST 13 (L) 05/26/2018 0935   AST 14 05/13/2017 0927   ALT 9 06/01/2018 0513   ALT 10 05/26/2018 0935   ALT 12 05/13/2017 0927   BILITOT 0.8 06/01/2018 0513   BILITOT 0.6 05/26/2018 0935   BILITOT 0.27 05/13/2017 0927       Lab Results  Component Value Date   LABCA2 11 04/20/2008    No components found for: [id number]  No results for input(s): INR in the last 168 hours.  Urinalysis    Component Value Date/Time   COLORURINE YELLOW 05/30/2018 1123   APPEARANCEUR CLEAR 05/30/2018 1123   LABSPEC 1.017 05/30/2018 1123   PHURINE 6.0 05/30/2018 1123   GLUCOSEU NEGATIVE 05/30/2018 1123   GLUCOSEU NEG mg/dL 40/98/1191 4782   HGBUR NEGATIVE 05/30/2018 1123   BILIRUBINUR NEGATIVE 05/30/2018 1123   KETONESUR NEGATIVE 05/30/2018 1123   PROTEINUR NEGATIVE 05/30/2018 1123   UROBILINOGEN 1 07/23/2006 2113   NITRITE NEGATIVE 05/30/2018 1123   LEUKOCYTESUR NEGATIVE 05/30/2018 1123     STUDIES: Dg Chest 2 View  Result Date: 06/19/2018 CLINICAL DATA:  Recurrent right pleural effusion. EXAM: CHEST - 2 VIEW COMPARISON:  06/02/2018 FINDINGS: PleurX catheter remains in place, unchanged. Small right pleural effusion with right mid and lower lung airspace  opacities, similar prior study. No pneumothorax. Left lung clear. Mild cardiomegaly. IMPRESSION: Stable airspace opacities and small effusion on the right. No pneumothorax. Mild cardiomegaly. Electronically Signed   By: Charlett Nose M.D.   On: 06/19/2018 13:13   Dg Chest 2 View  Result Date: 06/02/2018 CLINICAL DATA:  Follow-up right side  pneumothorax EXAM: CHEST - 2 VIEW COMPARISON:  06/01/2018 FINDINGS: Right PleurX catheter remains in place, unchanged. Small right pleural effusion. No visible pneumothorax. Interval removal of right central line. Patchy right lung airspace opacities are stable. Heart is borderline in size. Left lung clear. IMPRESSION: Patchy right lung airspace opacities and small right effusion, stable. No pneumothorax. Electronically Signed   By: Charlett Nose M.D.   On: 06/02/2018 08:39   Dg Chest 2 View  Result Date: 05/30/2018 CLINICAL DATA:  Preoperative examination prior to be ats. Patient reports right-sided chest tightness for the past 2 months but no shortness of breath. History of breast malignancy and right mastectomy in 2018 with thoracentesis ease in October and November. EXAM: CHEST - 2 VIEW COMPARISON:  PA and lateral chest x-ray of May 28, 2018 FINDINGS: There is a stable moderate-sized right pleural effusion with fluid in the minor fissure. There is no pneumothorax. The left lung is clear. The heart and pulmonary vascularity are normal. There is stable mild soft tissue fullness in the lower right paratracheal region which likely reflects lymphadenopathy. The bony thorax exhibits no acute abnormality. IMPRESSION: Stable moderate-sized right pleural effusion. No acute change since the previous study. Electronically Signed   By: David  [place] M.D.   On: 05/30/2018 11:16   Dg Chest 2 View  Result Date: 05/28/2018 CLINICAL DATA:  62 y/o F; right-sided pleural effusion. History of right breast cancer. EXAM: CHEST - 2 VIEW COMPARISON:  04/28/2018 chest radiograph. 05/01/2018 PET-CT. FINDINGS: Stable cardiac silhouette given projection and technique. The right-sided pleural effusion is mildly increased in comparison with prior chest radiograph. Hypermetabolic pleural-based tumor throughout the right hemithorax is better characterized on the prior PET-CT. Clear left lung. Surgical clips project over the right  axilla. Bones are unremarkable. IMPRESSION: Right-sided pleural effusion is mildly increased in comparison with prior chest radiograph. Hypermetabolic pleural-based tumor throughout the right hemithorax is better characterized on prior PET-CT. Electronically Signed   By: Mitzi Hansen M.D.   On: 05/28/2018 17:15   Dg Chest Port 1 View  Result Date: 06/01/2018 CLINICAL DATA:  Right-sided chest tube removal. EXAM: PORTABLE CHEST 1 VIEW COMPARISON:  Earlier today at 0500 hours. FINDINGS: 73. Removal of the more lateral of 2 right-sided chest tubes. The previously described tiny right superolateral pneumothorax is no longer identified. Right internal jugular line is unchanged. Normal heart size. Small volume right-sided pleural fluid and patchy areas of atelectasis remain. The fluid in the right minor fissure is significantly improved. IMPRESSION: Removal of 1 right-sided chest tube, without evidence of residual pneumothorax. Decreased right-sided pleural fluid and improved right-sided atelectasis. Electronically Signed   By: Jeronimo Greaves M.D.   On: 06/01/2018 15:26   Dg Chest Port 1 View  Result Date: 06/01/2018 CLINICAL DATA:  Re-evaluate pleural effusion. EXAM: PORTABLE CHEST 1 VIEW COMPARISON:  1 day prior FINDINGS: Two right chest tubes are unchanged in position. Less than 5% superolateral right pneumothorax remains. Probable trace right pleural air as well. Right internal jugular line tip at low SVC. Mild cardiomegaly. Volume loss in the right hemithorax with areas of postoperative atelectasis. There is also mild subsegmental atelectasis at the left lung base.  IMPRESSION: No significant change since one day prior. Right chest tubes remain in place with approximately 5% superolateral right pneumothorax. Concurrent small volume pleural fluid (i.e. Hydropneumothorax). Electronically Signed   By: Jeronimo Greaves M.D.   On: 06/01/2018 07:53   Dg Chest Port 1 View  Result Date: 05/31/2018 CLINICAL  DATA:  S/p right-sided VATS and talc pleurodesis. Follow-up pneumothorax. EXAM: PORTABLE CHEST 1 VIEW COMPARISON:  05/30/2018 FINDINGS: Right-sided pleural drain and chest tube remains in place, as well as right jugular central venous catheter. A tiny residual right pneumothorax is noted as well as small amount of fluid within the right pleural space. Atelectasis in right mid and lower lung is stable. Increased atelectasis or developing infiltrate seen in the retrocardiac left lower lobe. Heart size remains stable. IMPRESSION: Small residual right hydropneumothorax. New atelectasis or infiltrate in the retrocardiac left lower lobe. Electronically Signed   By: Myles Rosenthal M.D.   On: 05/31/2018 10:07   Dg Chest Port 1 View  Result Date: 05/30/2018 CLINICAL DATA:  Status post RIGHT VATS for pleural effusion drainage, pleural biopsies, talc pleurodesis and pleural drainage catheter placement. EXAM: PORTABLE CHEST 1 VIEW COMPARISON:  05/30/2018 and prior radiographs FINDINGS: RIGHT thoracostomy tubes are noted without pneumothorax. Decreased RIGHT pleural effusion with continued RIGHT LOWER lung opacities/atelectasis. A RIGHT IJ central venous catheter is present with tip overlying the LOWER SVC. The LEFT lung is clear. IMPRESSION: Postoperative changes with support apparatus as described including RIGHT thoracostomy tubes. No pneumothorax. Decreased RIGHT pleural effusion with continued RIGHT LOWER lung opacities/atelectasis. Electronically Signed   By: Harmon Pier M.D.   On: 05/30/2018 16:20      ELIGIBLE FOR AVAILABLE RESEARCH PROTOCOL: no  ASSESSMENT: 62 y.o. [place] woman   (1) status post right lumpectomy and sentinel lymph node sampling October 2005 for a 0.6 cm invasive ductal carcinoma involving one out of 2 sentinel lymph nodes sampled, grade 3, triple-negative, treated adjuvantly with doxorubicin and cyclophosphamide 4 followed by weekly paclitaxel 7, followed by adjuvant  radiation  RECURRENT DISEASE: (2) status post right breast upper outer quadrant biopsy and right axillary lymph node biopsy 10/01/2016, both positive for a T2 N1, stage IIIB invasive ductal carcinoma, triple negative, with an MIB-1 of 50-70%  (3) status post right modified radical mastectomy 11/05/2016 showing a pT2 pN1, stage IIIB invasive ductal carcinoma, grade 3, triple negative, with negative margins  (4) Not a candidate for radiation given prior history  (5) adjuvant chemotherapy consisting of carboplatin and gemcitabine given days 1 and 8 of each 21 day cycle, for 6 cycles, starting 11/20/2016, completed 03/11/2017  (a) day 8 cycle 2 omitted because of neutropenia; Neupogen/Neulasta added  (5) HIV positivity  (6) Genetic testing 06/17/2017:  no pathogenic mutations. Genes tested: APC, ATM, AXIN2, BARD1, BLM, BMPR1A, BRCA1, BRCA2, BRIP1, CDH1, CDK4, CDKN2A (p14ARF), CDKN2A (p16INK4a), CEBPA, CHEK2, CTNNA1, DICER1, EPCAM*, GATA2, GREM1*, HRAS, KIT, MEN1, MLH1, MSH2, MSH3, MSH6, MUTYH, NBN, NF1, PALB2, PDGFRA, PMS2, POLD1, POLE, PTEN, RAD50, RAD51C, RAD51D, RUNX1, SDHB, SDHC, SDHD, SMAD4, SMARCA4, STK11, TERC, TERT, TP53, TSC1, TSC2, VHL. The following genes were evaluated for sequence changes only: HOXB13*, NTHL1*, SDHA.Marland Kitchen A variant of uncertain significance (VUS) in a gene called NTHL1 was also noted. c.736G>A (p.Ala246Thr)  METASTATIC DISEASE: November 2019  (1) Patient seen in urgent care and ultimately ER on 10/21 for shortness of breath, chest xray demonstrated Right pleural effusion.   (a) right thoracenteses on 10/21 and 11/4 results show atypical cells, non diagnostic (b) CT chest 04/22/2018  shows re-accumulation of fluid, and right pleural nodularity. (c) PET scan on 05/01/2018 shows hypermetabolic pleural based metastases, right CP angle nodal metastases, no evidence of malignancy in abdomen and pelvis. (d) bronchoscopy with biopsy by Dr. 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  We obtained a foundation 1 on this tumor and it shows a variety of genomic changes which however are not reflected in the number of mutations per megabase.  However the tumor associated cells are positive for PD-L1.  We could consider chemotherapy with Doxil.  It is of concern that she has already had the 5 most active drugs and combinations and breast cancer in the tumor has been able to survive all of them.  I think it would be worth getting been pembrolizumab a try.  Today we discussed the possible toxicity side effects and complications of this agent.  We will try to obtain it for her.  She will need a port in any case.  I have written the orders for that.  I am also checking with her infectious disease specialist Dr. Daiva Eves in case there are specific concerns relating to Tamsen's ID status from this drug  She will see [place] again on 07/15/2018, and hopefully have her first pembrolizumab dose at that time.  I would then see her at the time of her second dose, 3 weeks later.  I spent approximately 30 minutes face to face with Claris Che with more than 50% of that time spent in counseling and coordination of care.   Emalie Mcwethy, Valentino Hue, MD  06/23/18 11:51 AM Medical Oncology and Hematology [facility] [address] [place], [place] [address] Tel. [phone]    Fax. [phone]   I, Mal Misty am acting as a Neurosurgeon for Lowella Dell, MD.   I, Ruthann Cancer MD, have reviewed the above documentation for accuracy and completeness, and I agree with the above.

## 2018-06-23 ENCOUNTER — Telehealth: Payer: Self-pay | Admitting: Oncology

## 2018-06-23 ENCOUNTER — Inpatient Hospital Stay (HOSPITAL_BASED_OUTPATIENT_CLINIC_OR_DEPARTMENT_OTHER): Payer: Medicare HMO | Admitting: Oncology

## 2018-06-23 VITALS — BP 126/85 | HR 79 | Temp 97.9°F | Resp 18 | Ht 67.0 in | Wt 270.8 lb

## 2018-06-23 DIAGNOSIS — Z79899 Other long term (current) drug therapy: Secondary | ICD-10-CM | POA: Diagnosis not present

## 2018-06-23 DIAGNOSIS — B20 Human immunodeficiency virus [HIV] disease: Secondary | ICD-10-CM

## 2018-06-23 DIAGNOSIS — Z171 Estrogen receptor negative status [ER-]: Secondary | ICD-10-CM

## 2018-06-23 DIAGNOSIS — C50411 Malignant neoplasm of upper-outer quadrant of right female breast: Secondary | ICD-10-CM | POA: Diagnosis not present

## 2018-06-23 DIAGNOSIS — Z923 Personal history of irradiation: Secondary | ICD-10-CM

## 2018-06-23 DIAGNOSIS — Z7189 Other specified counseling: Secondary | ICD-10-CM

## 2018-06-23 DIAGNOSIS — C50911 Malignant neoplasm of unspecified site of right female breast: Secondary | ICD-10-CM

## 2018-06-23 NOTE — Telephone Encounter (Signed)
Gave avs and calendar ° °

## 2018-06-26 DIAGNOSIS — Z7189 Other specified counseling: Secondary | ICD-10-CM | POA: Insufficient documentation

## 2018-06-26 NOTE — Progress Notes (Signed)
Gus I am totally OK with you starting the pembrolizumab with Kayla Price

## 2018-06-27 ENCOUNTER — Encounter (HOSPITAL_COMMUNITY): Payer: Self-pay | Admitting: Oncology

## 2018-07-07 ENCOUNTER — Other Ambulatory Visit: Payer: Self-pay | Admitting: Oncology

## 2018-07-10 ENCOUNTER — Ambulatory Visit (INDEPENDENT_AMBULATORY_CARE_PROVIDER_SITE_OTHER): Payer: Self-pay | Admitting: Cardiothoracic Surgery

## 2018-07-10 ENCOUNTER — Telehealth: Payer: Self-pay | Admitting: *Deleted

## 2018-07-10 ENCOUNTER — Other Ambulatory Visit: Payer: Self-pay

## 2018-07-10 ENCOUNTER — Ambulatory Visit
Admission: RE | Admit: 2018-07-10 | Discharge: 2018-07-10 | Disposition: A | Discharge status: Home / Self Care | Payer: Medicare HMO | Source: Ambulatory Visit | Attending: Cardiothoracic Surgery | Admitting: Cardiothoracic Surgery

## 2018-07-10 ENCOUNTER — Encounter: Payer: Self-pay | Admitting: Cardiothoracic Surgery

## 2018-07-10 ENCOUNTER — Other Ambulatory Visit: Payer: Self-pay | Admitting: Cardiothoracic Surgery

## 2018-07-10 VITALS — BP 112/80 | HR 75 | Resp 18 | Ht 67.0 in | Wt 269.2 lb

## 2018-07-10 DIAGNOSIS — J910 Malignant pleural effusion: Secondary | ICD-10-CM

## 2018-07-10 DIAGNOSIS — J9811 Atelectasis: Secondary | ICD-10-CM | POA: Diagnosis not present

## 2018-07-10 DIAGNOSIS — J90 Pleural effusion, not elsewhere classified: Secondary | ICD-10-CM

## 2018-07-10 NOTE — Progress Notes (Signed)
[address]       [place] [address]             [id number]      Ayo H Capell [facility] Medical Record #[id number] Date of Birth: June 22, 1956  Referring: Wilber Bihari Cornett* Primary Care: Lauree Chandler, NP Primary Cardiologist: No primary care provider on file.   Chief Complaint:   POST OP FOLLOW UP DATE OF PROCEDURE:  05/30/2018 PREOPERATIVE DIAGNOSIS:  Recurrent right pleural effusion. POSTOPERATIVE DIAGNOSIS:  Recurrent right pleural effusion with pleural implants consistent with metastatic breast cancer. PROCEDURE PERFORMED:  Bronchoscopy, right video-assisted thoracoscopy, drainage of pleural fluid, biopsy pleural implants, talc pleurodesis and placement of PleurX catheter. SURGEON:  Lanelle Bal, MD Diagnosis 1. Pleura, biopsy, Right - CARCINOMA. 2. Pleura, biopsy, Right - CARCINOMA. - SEE COMMENT.  History of Present Illness:     Patient returns to the office today in follow-up after placement of her right Pleurx catheter and right video-assisted thoracoscopy with talc pleurodesis and biopsy of pleural implants. The patient Pleurx catheter has drained very little over the past week.      Past Medical History:  Diagnosis Date  . Alopecia areata 11/28/2009  . Bell's palsy   . Cancer [facility]) 2005   Breast cancer   chemotherapy and radiation  . CKD (chronic kidney disease) stage 3, GFR 30-59 ml/min (HCC) 06/08/2015  . Dry eye syndrome   . Family history of lung cancer   . Family history of non-Hodgkin's lymphoma   . Fasting hyperglycemia   . Gestational diabetes    2001  . HIP FRACTURE, RIGHT 05/06/2008  . History of kidney stones   . HIV DISEASE 03/27/2006  . HYPERLIPIDEMIA, MIXED 12/15/2007  . HYPERTENSION 03/27/2006  . 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 MENORRHAGIA, POSTMENOPAUSAL 02/03/2009  . Osteoarthritis of left knee 06/08/2015  . OSTEOARTHROSIS, LOCAL, SCND, UNSPC SITE 04/07/2007  . PVD 04/07/2007  .  Tinea capitis   . TRIGGER FINGER 05/06/2008  . Unspecified vitamin D deficiency 08/06/2007     Social History   Tobacco Use  Smoking Status Never Smoker  Smokeless Tobacco Never Used    Social History   Substance and Sexual Activity  Alcohol Use No     Allergies  Allergen Reactions  . Lisinopril Anaphylaxis and Swelling    Swelling of tongue and mouth 11/05/16- tolerates Olmesartan  . Pepcid [Famotidine] Other (See Comments)    PPI H2, BLOCKERS LOWER GASTRIC PH WHICH WOULD LEAD TO SUBTHERAPEUTIC RILPIVIRINE LEVELS AND POTENTIAL VIROLOGICAL FAILURE WITH RESISTANCE  . Prilosec [Omeprazole] Other (See Comments)    PPI H2, BLOCKERS LOWER GASTRIC PH WHICH WOULD LEAD TO SUBTHERAPEUTIC RILPIVIRINE LEVELS AND POTENTIAL VIROLOGICAL FAILURE WITH RESISTANCE  . Tums [Calcium Carbonate Antacid] Other (See Comments)    TUMS ANTACIDS CAN LOWER GASTRIC PH WHICH COULD  LEAD TO SUBTHERAPEUTIC RILPIVIRINE LEVELS AND POTENTIAL VIROLOGICAL FAILURE WITH RESISTANCE TUMS CAN BE GIVEN BUT NEED CONSULT WITH ID PHARMACY RE TIMING. I PREFER HER TO AVOID ALL TOGETHER    Current Outpatient Medications  Medication Sig Dispense Refill  . darunavir-cobicistat (PREZCOBIX) 800-150 MG tablet Take 1 tablet by mouth at bedtime. SWALLOW WHOLE. DO NOT CRUSH, BREAK OR CHEW TABLETS. TAKE WITH FOOD.    . Dolutegravir-Rilpivirine (JULUCA) 50-25 MG TABS Take 1 tablet by mouth at bedtime. Take with the Prezcobix.    Marland Kitchen HYDROcodone-acetaminophen (NORCO/VICODIN) 5-325 MG tablet TAKE ONE TABLET BY MOUTH EVERY 6 HOURS AS NEEDED FOR MODERATE PAIN 60 tablet  0  . hydrocortisone cream 1 % Apply 1 application topically daily as needed for itching.    . levothyroxine (SYNTHROID, LEVOTHROID) 150 MCG tablet Take 1 tablet (150 mcg total) by mouth daily before breakfast. 90 tablet 3  . loratadine (CLARITIN) 10 MG tablet Take 10 mg by mouth daily as needed for allergies.     . maraviroc (SELZENTRY) 150 MG tablet Take 1 tablet (150 mg  total) by mouth 2 (two) times daily. 60 tablet 11  . Menthol, Topical Analgesic, (BENGAY EX) Apply 1 application topically daily as needed (muscle pain).    . Naphazoline-Glycerin (REDNESS RELIEF OP) Place 1 drop into both eyes daily as needed (redness).    Marland Kitchen olmesartan-hydrochlorothiazide (BENICAR HCT) 40-25 MG tablet TAKE 1 TABLET BY MOUTH DAILY. 30 tablet 1  . Pitavastatin Calcium 4 MG TABS Take 1 tablet (4 mg total) by mouth daily at 12 noon. This may be placebo, study provided. Do not dispense.     No current facility-administered medications for this visit.        Physical Exam: BP 112/80 (BP Location: Left Arm, Patient Position: Sitting, Cuff Size: Large)   Pulse 75   Resp 18   Ht 5\' 7"  (1.702 m)   Wt 269 lb 3.2 oz (122.1 kg)   LMP 11/06/2003   SpO2 97% Comment: RA  BMI 42.16 kg/m   General appearance: alert and cooperative Neurologic: intact Heart: regular rate and rhythm, S1, S2 normal, no murmur, click, rub or gallop Lungs: clear to auscultation bilaterally Abdomen: soft, non-tender; bowel sounds normal; no masses,  no organomegaly Extremities: extremities normal, atraumatic, no cyanosis or edema and Homans sign is negative, no sign of DVT Wound: Patient's Pleurx was drained in office today with very minimal drainage  The patient's entry site for the Pleurx catheter was sterilely cleaned with Betadine 3 cc of 1% lidocaine was infiltrated into the area, the cuff and Pleurx catheter was easily freed and removed without difficulty in its entirety dry dressing was applied. Patient was instructed in local wound care for the next several days   Diagnostic Studies & Laboratory data:     Recent Radiology Findings:   Dg Chest 2 View  Result Date: 07/10/2018 CLINICAL DATA:  RIGHT VATS on 05/30/2018. Follow-up RIGHT pleural effusion. EXAM: CHEST - 2 VIEW COMPARISON:  06/19/2018 and earlier. FINDINGS: Interval removal of the RIGHT PleurX catheter. Stable moderate-sized RIGHT  pleural effusion which may be partially loculated. Associated consolidation in the RIGHT LOWER LOBE, unchanged. Stable linear atelectasis in the RIGHT MIDDLE LOBE and the INFERIOR RIGHT UPPER LOBE. No new pulmonary parenchymal abnormalities. Cardiomediastinal silhouette unremarkable and unchanged. Mild degenerative changes involving the thoracic spine. IMPRESSION: 1. Interval removal of the RIGHT PleurX catheter. Stable moderate-sized RIGHT pleural effusion which may be partially loculated. Associated passive atelectasis in the RIGHT LOWER LOBE, unchanged. 2. Stable linear atelectasis in the RIGHT MIDDLE LOBE and the INFERIOR RIGHT UPPER LOBE 3. No new abnormalities. Electronically Signed   By: Evangeline Dakin M.D.   On: 07/10/2018 16:21   I have independently reviewed the above radiology studies  and reviewed the findings with the patient.   Recent Lab Findings: Lab Results  Component Value Date   WBC 10.7 (H) 06/01/2018   HGB 12.9 06/01/2018   HCT 40.0 06/01/2018   PLT 194 06/01/2018   GLUCOSE 106 (H) 06/02/2018   CHOL 199 12/25/2017   TRIG 115 12/25/2017   HDL 60 12/25/2017   LDLCALC 116 (H) 12/25/2017  ALT 9 06/01/2018   AST 15 06/01/2018   NA 138 06/02/2018   K 3.9 06/02/2018   CL 103 06/02/2018   CREATININE 1.38 (H) 06/02/2018   BUN 20 06/02/2018   CO2 29 06/02/2018   TSH 9.72 (H) 04/24/2018   INR 1.05 05/30/2018   HGBA1C 5.3 04/18/2018      Assessment / Plan:   Patient stable following recent right video-assisted thoracoscopy and drainage of recurrent right pleural effusion with biopsy of pleural implants, , patient stable since Pleurx catheter removed several weeks ago. She is to start new course of therapy for her triple negative breast cancer next week Follow-up chest x-ray today is satisfactory I plan to see her back at her or Dr. Virgie Dad request   Grace Isaac MD      [address] [place] [address] Office [phone]   Beeper  [phone]  07/10/2018 5:09 PM

## 2018-07-10 NOTE — Telephone Encounter (Signed)
This RN spoke with pt per her call stating concern regarding pending chemo and " I haven't gotten my port placed yet - do I need to reschedule the chemo ?"  This RN reviewed data and noted plan is for pt to receive Keytruda. Pt had right mastectomy with lymph node dissection in 2018.  This RN informed pt above medication can be given in her left arm if port is not placed prior to scheduled therapy.  Kayla Price verbalized understanding.  This RN contacted IR at [facility] per noted order stating requested facility. Obtained VM for Anderson Malta with IR - detailed message left per need with this RN direct desk number given for return call.

## 2018-07-14 ENCOUNTER — Other Ambulatory Visit: Payer: Self-pay

## 2018-07-14 ENCOUNTER — Other Ambulatory Visit: Payer: Self-pay | Admitting: Infectious Disease

## 2018-07-14 ENCOUNTER — Encounter (HOSPITAL_COMMUNITY): Payer: Self-pay

## 2018-07-14 ENCOUNTER — Ambulatory Visit (HOSPITAL_COMMUNITY)
Admission: RE | Admit: 2018-07-14 | Discharge: 2018-07-14 | Disposition: A | Discharge status: Home / Self Care | Payer: Medicare HMO | Source: Ambulatory Visit | Attending: Oncology | Admitting: Oncology

## 2018-07-14 ENCOUNTER — Other Ambulatory Visit: Payer: Self-pay | Admitting: Hematology and Oncology

## 2018-07-14 ENCOUNTER — Other Ambulatory Visit: Payer: Self-pay | Admitting: Radiology

## 2018-07-14 ENCOUNTER — Encounter: Payer: Self-pay | Admitting: Oncology

## 2018-07-14 DIAGNOSIS — Z823 Family history of stroke: Secondary | ICD-10-CM | POA: Insufficient documentation

## 2018-07-14 DIAGNOSIS — Z888 Allergy status to other drugs, medicaments and biological substances status: Secondary | ICD-10-CM | POA: Insufficient documentation

## 2018-07-14 DIAGNOSIS — Z8249 Family history of ischemic heart disease and other diseases of the circulatory system: Secondary | ICD-10-CM | POA: Insufficient documentation

## 2018-07-14 DIAGNOSIS — Z833 Family history of diabetes mellitus: Secondary | ICD-10-CM | POA: Insufficient documentation

## 2018-07-14 DIAGNOSIS — Z8261 Family history of arthritis: Secondary | ICD-10-CM | POA: Diagnosis not present

## 2018-07-14 DIAGNOSIS — M1990 Unspecified osteoarthritis, unspecified site: Secondary | ICD-10-CM | POA: Diagnosis not present

## 2018-07-14 DIAGNOSIS — C50919 Malignant neoplasm of unspecified site of unspecified female breast: Secondary | ICD-10-CM | POA: Diagnosis not present

## 2018-07-14 DIAGNOSIS — Z171 Estrogen receptor negative status [ER-]: Secondary | ICD-10-CM | POA: Insufficient documentation

## 2018-07-14 DIAGNOSIS — I129 Hypertensive chronic kidney disease with stage 1 through stage 4 chronic kidney disease, or unspecified chronic kidney disease: Secondary | ICD-10-CM | POA: Insufficient documentation

## 2018-07-14 DIAGNOSIS — C50411 Malignant neoplasm of upper-outer quadrant of right female breast: Secondary | ICD-10-CM

## 2018-07-14 DIAGNOSIS — Z7982 Long term (current) use of aspirin: Secondary | ICD-10-CM | POA: Insufficient documentation

## 2018-07-14 DIAGNOSIS — Z79890 Hormone replacement therapy (postmenopausal): Secondary | ICD-10-CM | POA: Insufficient documentation

## 2018-07-14 DIAGNOSIS — E782 Mixed hyperlipidemia: Secondary | ICD-10-CM | POA: Diagnosis not present

## 2018-07-14 DIAGNOSIS — B20 Human immunodeficiency virus [HIV] disease: Secondary | ICD-10-CM | POA: Insufficient documentation

## 2018-07-14 DIAGNOSIS — N183 Chronic kidney disease, stage 3 (moderate): Secondary | ICD-10-CM | POA: Diagnosis not present

## 2018-07-14 DIAGNOSIS — Z9851 Tubal ligation status: Secondary | ICD-10-CM | POA: Diagnosis not present

## 2018-07-14 DIAGNOSIS — Z8632 Personal history of gestational diabetes: Secondary | ICD-10-CM | POA: Diagnosis not present

## 2018-07-14 DIAGNOSIS — Z853 Personal history of malignant neoplasm of breast: Secondary | ICD-10-CM | POA: Diagnosis not present

## 2018-07-14 DIAGNOSIS — Z79899 Other long term (current) drug therapy: Secondary | ICD-10-CM | POA: Diagnosis not present

## 2018-07-14 DIAGNOSIS — E039 Hypothyroidism, unspecified: Secondary | ICD-10-CM | POA: Insufficient documentation

## 2018-07-14 DIAGNOSIS — G510 Bell's palsy: Secondary | ICD-10-CM | POA: Diagnosis not present

## 2018-07-14 DIAGNOSIS — Z9011 Acquired absence of right breast and nipple: Secondary | ICD-10-CM | POA: Insufficient documentation

## 2018-07-14 DIAGNOSIS — C50911 Malignant neoplasm of unspecified site of right female breast: Secondary | ICD-10-CM

## 2018-07-14 DIAGNOSIS — C782 Secondary malignant neoplasm of pleura: Secondary | ICD-10-CM | POA: Diagnosis not present

## 2018-07-14 DIAGNOSIS — Z452 Encounter for adjustment and management of vascular access device: Secondary | ICD-10-CM | POA: Diagnosis not present

## 2018-07-14 HISTORY — PX: IR IMAGING GUIDED PORT INSERTION: IMG5740

## 2018-07-14 LAB — BASIC METABOLIC PANEL
Anion gap: 9 (ref 5–15)
BUN: 12 mg/dL (ref 8–23)
CHLORIDE: 101 mmol/L (ref 98–111)
CO2: 29 mmol/L (ref 22–32)
Calcium: 9.8 mg/dL (ref 8.9–10.3)
Creatinine, Ser: 1.51 mg/dL — ABNORMAL HIGH (ref 0.44–1.00)
GFR calc Af Amer: 42 mL/min — ABNORMAL LOW (ref >60)
GFR calc non Af Amer: 37 mL/min — ABNORMAL LOW (ref >60)
Glucose, Bld: 97 mg/dL (ref 70–99)
Potassium: 3.7 mmol/L (ref 3.5–5.1)
Sodium: 139 mmol/L (ref 135–145)

## 2018-07-14 LAB — CBC
HEMATOCRIT: 41.9 % (ref 36.0–46.0)
Hemoglobin: 13.8 g/dL (ref 12.0–15.0)
MCH: 33.3 pg (ref 26.0–34.0)
MCHC: 32.9 g/dL (ref 30.0–36.0)
MCV: 101 fL — ABNORMAL HIGH (ref 80.0–100.0)
Platelets: 200 10*3/uL (ref 150–400)
RBC: 4.15 MIL/uL (ref 3.87–5.11)
RDW: 13.8 % (ref 11.5–15.5)
WBC: 3.8 10*3/uL — ABNORMAL LOW (ref 4.0–10.5)
nRBC: 0 % (ref 0.0–0.2)

## 2018-07-14 LAB — PROTIME-INR
INR: 0.98
Prothrombin Time: 12.9 seconds (ref 11.4–15.2)

## 2018-07-14 MED ORDER — DEXTROSE 5 % IV SOLN
3.0000 g | INTRAVENOUS | Status: AC
  Administered 2018-07-14: 3 g via INTRAVENOUS
  Filled 2018-07-14: qty 3000

## 2018-07-14 MED ORDER — HEPARIN SOD (PORK) LOCK FLUSH 100 UNIT/ML IV SOLN
INTRAVENOUS | Status: AC
  Administered 2018-07-14: 500 [IU]
  Filled 2018-07-14: qty 5

## 2018-07-14 MED ORDER — FENTANYL CITRATE (PF) 100 MCG/2ML IJ SOLN
INTRAMUSCULAR | Status: AC
  Filled 2018-07-14: qty 4

## 2018-07-14 MED ORDER — LIDOCAINE-EPINEPHRINE 2 %-1:100000 IJ SOLN
INTRAMUSCULAR | Status: AC | PRN
  Administered 2018-07-14: 10 mL

## 2018-07-14 MED ORDER — FENTANYL CITRATE (PF) 100 MCG/2ML IJ SOLN
INTRAMUSCULAR | Status: AC | PRN
  Administered 2018-07-14: 50 ug via INTRAVENOUS
  Administered 2018-07-14: 25 ug via INTRAVENOUS

## 2018-07-14 MED ORDER — LIDOCAINE HCL 1 % IJ SOLN
INTRAMUSCULAR | Status: AC | PRN
  Administered 2018-07-14: 10 mL

## 2018-07-14 MED ORDER — MIDAZOLAM HCL 2 MG/2ML IJ SOLN
INTRAMUSCULAR | Status: AC | PRN
  Administered 2018-07-14: 1 mg via INTRAVENOUS
  Administered 2018-07-14 (×2): 0.5 mg via INTRAVENOUS
  Administered 2018-07-14: 1 mg via INTRAVENOUS

## 2018-07-14 MED ORDER — SODIUM CHLORIDE 0.9 % IV SOLN: INTRAVENOUS | Status: DC

## 2018-07-14 MED ORDER — MIDAZOLAM HCL 2 MG/2ML IJ SOLN
INTRAMUSCULAR | Status: AC
  Filled 2018-07-14: qty 4

## 2018-07-14 MED ORDER — LIDOCAINE HCL 1 % IJ SOLN
INTRAMUSCULAR | Status: AC
  Filled 2018-07-14: qty 20

## 2018-07-14 MED ORDER — LIDOCAINE-EPINEPHRINE (PF) 1 %-1:200000 IJ SOLN
INTRAMUSCULAR | Status: AC
  Filled 2018-07-14: qty 30

## 2018-07-14 NOTE — Discharge Instructions (Signed)
Moderate Conscious Sedation, Adult, Care After  These instructions provide you with information about caring for yourself after your procedure. Your health care provider may also give you more specific instructions. Your treatment has been planned according to current medical practices, but problems sometimes occur. Call your health care provider if you have any problems or questions after your procedure.  What can I expect after the procedure?  After your procedure, it is common:  · To feel sleepy for several hours.  · To feel clumsy and have poor balance for several hours.  · To have poor judgment for several hours.  · To vomit if you eat too soon.  Follow these instructions at home:  For at least 24 hours after the procedure:    · Do not:  ? Participate in activities where you could fall or become injured.  ? Drive.  ? Use heavy machinery.  ? Drink alcohol.  ? Take sleeping pills or medicines that cause drowsiness.  ? Make important decisions or sign legal documents.  ? Take care of children on your own.  · Rest.  Eating and drinking  · Follow the diet recommended by your health care provider.  · If you vomit:  ? Drink water, juice, or soup when you can drink without vomiting.  ? Make sure you have little or no nausea before eating solid foods.  General instructions  · Have a responsible adult stay with you until you are awake and alert.  · Take over-the-counter and prescription medicines only as told by your health care provider.  · If you smoke, do not smoke without supervision.  · Keep all follow-up visits as told by your health care provider. This is important.  Contact a health care provider if:  · You keep feeling nauseous or you keep vomiting.  · You feel light-headed.  · You develop a rash.  · You have a fever.  Get help right away if:  · You have trouble breathing.  This information is not intended to replace advice given to you by your health care provider. Make sure you discuss any questions you have  with your health care provider.  Document Released: 04/01/2013 Document Revised: 11/14/2015 Document Reviewed: 10/01/2015  Elsevier Interactive Patient Education © 2019 [employer]  Implanted Port Insertion, Care After  This sheet gives you information about how to care for yourself after your procedure. Your health care provider may also give you more specific instructions. If you have problems or questions, contact your health care provider.  What can I expect after the procedure?  After the procedure, it is common to have:  · Discomfort at the port insertion site.  · Bruising on the skin over the port. This should improve over 3-4 days.  Follow these instructions at home:  Port care  · After your port is placed, you will get a manufacturer's information card. The card has information about your port. Keep this card with you at all times.  · Take care of the port as told by your health care provider. Ask your health care provider if you or a family member can get training for taking care of the port at home. A home health care nurse may also take care of the port.  · Make sure to remember what type of port you have.  Incision care         · Follow instructions from your health care provider about how to take care of your port insertion   site. Make sure you:  ? Wash your hands with soap and water before and after you change your bandage (dressing). If soap and water are not available, use hand sanitizer.  ? Change your dressing as told by your health care provider.  ? Leave stitches (sutures), skin glue, or adhesive strips in place. These skin closures may need to stay in place for 2 weeks or longer. If adhesive strip edges start to loosen and curl up, you may trim the loose edges. Do not remove adhesive strips completely unless your health care provider tells you to do that.  · Check your port insertion site every day for signs of infection. Check for:  ? Redness, swelling, or pain.  ? Fluid or  blood.  ? Warmth.  ? Pus or a bad smell.  Activity  · Return to your normal activities as told by your health care provider. Ask your health care provider what activities are safe for you.  · Do not lift anything that is heavier than 10 lb (4.5 kg), or the limit that you are told, until your health care provider says that it is safe.  General instructions  · Take over-the-counter and prescription medicines only as told by your health care provider.  · Do not take baths, swim, or use a hot tub until your health care provider approves. Ask your health care provider if you may take showers. You may only be allowed to take sponge baths.  · Do not drive for 24 hours if you were given a sedative during your procedure.  · Wear a medical alert bracelet in case of an emergency. This will tell any health care providers that you have a port.  · Keep all follow-up visits as told by your health care provider. This is important.  Contact a health care provider if:  · You cannot flush your port with saline as directed, or you cannot draw blood from the port.  · You have a fever or chills.  · You have redness, swelling, or pain around your port insertion site.  · You have fluid or blood coming from your port insertion site.  · Your port insertion site feels warm to the touch.  · You have pus or a bad smell coming from the port insertion site.  Get help right away if:  · You have chest pain or shortness of breath.  · You have bleeding from your port that you cannot control.  Summary  · Take care of the port as told by your health care provider. Keep the manufacturer's information card with you at all times.  · Change your dressing as told by your health care provider.  · Contact a health care provider if you have a fever or chills or if you have redness, swelling, or pain around your port insertion site.  · Keep all follow-up visits as told by your health care provider.  This information is not intended to replace advice given to  you by your health care provider. Make sure you discuss any questions you have with your health care provider.  Document Released: 04/01/2013 Document Revised: 01/07/2018 Document Reviewed: 01/07/2018  Elsevier Interactive Patient Education © 2019 [employer]

## 2018-07-14 NOTE — Consult Note (Signed)
Chief Complaint: Patient was seen in consultation today for port a cath placement at the request of Magrinat,Gustav C  Referring Physician(s): Chauncey Cruel  Supervising Physician: Markus Daft  Patient Status: [facility] - Out-pt  History of Present Illness: Kayla Price is a 63 y.o. female   Breast Ca 2005 Recurrence 2018 Metastatic disease 04/2018 Rt pleural effusion; [place]ora 10/21 and 11/4: atypical cells (c) PET scan on 05/01/2018 shows hypermetabolic pleural based metastases, right CP angle nodal metastases, no evidence of malignancy in abdomen and pelvis. (d) bronchoscopy with biopsy by Dr. 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 Alopecia areata 11/28/2009  . Bell's palsy   . Cancer [facility]) 2005   Breast cancer   chemotherapy and radiation  . CKD (chronic kidney disease) stage 3, GFR 30-59 ml/min (HCC) 06/08/2015  . Dry eye syndrome   . Family history of lung cancer   . Family history of non-Hodgkin's lymphoma   . Fasting hyperglycemia   . Gestational diabetes    2001  . HIP FRACTURE, RIGHT 05/06/2008  . History of kidney stones   . HIV DISEASE 03/27/2006  . HYPERLIPIDEMIA, MIXED 12/15/2007  . HYPERTENSION 03/27/2006  . 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 MENORRHAGIA, POSTMENOPAUSAL 02/03/2009  . Osteoarthritis of left knee 06/08/2015  . OSTEOARTHROSIS, LOCAL, SCND, UNSPC SITE 04/07/2007  . PVD 04/07/2007  . Tinea capitis   . TRIGGER FINGER 05/06/2008  . Unspecified vitamin D deficiency 08/06/2007    Past Surgical History:  Procedure Laterality Date  . BREAST LUMPECTOMY Right    2005  . CHEST TUBE INSERTION Right 05/30/2018   Procedure: INSERTION PLEURAL DRAINAGE CATHETER;  Surgeon: Grace Isaac, MD;  Location:  [place];  Service: Thoracic;  Laterality: Right;  . COLONOSCOPY    . COLONOSCOPY WITH ESOPHAGOGASTRODUODENOSCOPY (EGD)    . ENDOBRONCHIAL ULTRASOUND Bilateral 05/15/2018   Procedure: ENDOBRONCHIAL ULTRASOUND;  Surgeon: Collene Gobble, MD;  Location: [facility];  Service: Cardiopulmonary;  Laterality: Bilateral;  . FINE NEEDLE ASPIRATION BIOPSY  05/15/2018   Procedure: FINE NEEDLE ASPIRATION BIOPSY;  Surgeon: Collene Gobble, MD;  Location: [facility];  Service: Cardiopulmonary;;  . FLEXIBLE BRONCHOSCOPY  05/15/2018   Procedure: FLEXIBLE BRONCHOSCOPY;  Surgeon: Collene Gobble, MD;  Location: [facility];  Service: Cardiopulmonary;;  . HYSTEROSCOPY  2006  . IR THORACENTESIS ASP PLEURAL SPACE W/IMG GUIDE  04/14/2018  . IR THORACENTESIS ASP PLEURAL SPACE W/IMG GUIDE  04/28/2018  . KNEE ARTHROSCOPY Left 2002  . LYMPH NODE DISSECTION  2005  . MASTECTOMY MODIFIED RADICAL Right 11/05/2016   Procedure: RIGHT MASTECTOMY MODIFIED RADICAL;  Surgeon: Fanny Skates, MD;  Location: [place];  Service: General;  Laterality: Right;  . MODIFIED RADICAL MASTECTOMY Right 11/05/2016  . placement   of port-a-cath    . PLEURAL BIOPSY Right 05/30/2018   Procedure: PLEURAL BIOPSY;  Surgeon: Grace Isaac, MD;  Location: [place];  Service: Thoracic;  Laterality: Right;  . PLEURAL EFFUSION DRAINAGE Right 05/30/2018   Procedure: DRAINAGE OF PLEURAL EFFUSION;  Surgeon: Grace Isaac, MD;  Location: [place];  Service: Thoracic;  Laterality: Right;  . PORT-A-CATH REMOVAL  2006   insertion 2005  . PORT-A-CATH REMOVAL N/A 03/29/2017  Procedure: REMOVAL PORT-A-CATH;  Surgeon: Fanny Skates, MD;  Location: [place];  Service: General;  Laterality: N/A;  . PORTACATH PLACEMENT Right 11/05/2016   Procedure: INSERTION PORT-A-CATH WITH ULTRA SOUND;  Surgeon: Fanny Skates, MD;  Location: [place];  Service: General;  Laterality: Right;  . removal of port a cath  12/2014  . TALC PLEURODESIS Right 05/30/2018   Procedure: Pietro Cassis;  Surgeon: Grace Isaac, MD;  Location: [place];  Service: Thoracic;  Laterality: Right;  . THYROID SURGERY  2009   Ablation   . TOTAL KNEE ARTHROPLASTY Left 02/06/2016   Procedure: TOTAL KNEE ARTHROPLASTY;  Surgeon: Frederik Pear, MD;  Location: [place];  Service: Orthopedics;  Laterality: Left;  . TUBAL LIGATION    . VIDEO ASSISTED THORACOSCOPY Right 05/30/2018   Procedure: VIDEO ASSISTED THORACOSCOPY;  Surgeon: Grace Isaac, MD;  Location: [place];  Service: Thoracic;  Laterality: Right;    Allergies: Lisinopril; Pepcid [famotidine]; Prilosec [omeprazole]; and Tums [calcium carbonate antacid]  Medications: Prior to Admission medications   Medication Sig Start Date End Date Taking? Authorizing Provider  darunavir-cobicistat (PREZCOBIX) 800-150 MG tablet Take 1 tablet by mouth at bedtime. SWALLOW WHOLE. DO NOT CRUSH, BREAK OR CHEW TABLETS. TAKE WITH FOOD. 05/15/18   Collene Gobble, MD  Dolutegravir-Rilpivirine (JULUCA) 50-25 MG TABS Take 1 tablet by mouth at bedtime. Take with the Prezcobix. 05/15/18   Collene Gobble, MD  HYDROcodone-acetaminophen (NORCO/VICODIN) 5-325 MG tablet TAKE ONE TABLET BY MOUTH EVERY 6 HOURS AS NEEDED FOR MODERATE PAIN 06/19/18   Mast, Man X, NP  hydrocortisone cream 1 % Apply 1 application topically daily as needed for itching.    [provider]  levothyroxine (SYNTHROID, LEVOTHROID) 150 MCG tablet Take 1 tablet (150 mcg total) by mouth daily before breakfast. 03/07/18   Renato Shin, MD  loratadine (CLARITIN) 10 MG tablet Take 10 mg by mouth daily as needed for allergies.     [provider]  maraviroc (SELZENTRY) 150 MG tablet Take 1 tablet (150 mg total) by mouth 2 (two) times daily. 07/04/17   Truman Hayward, MD  Menthol, Topical Analgesic, (BENGAY EX) Apply 1 application topically daily as needed (muscle pain).    [provider]  Naphazoline-Glycerin (REDNESS RELIEF OP) Place 1 drop into both eyes daily as  needed (redness).    [provider]  olmesartan-hydrochlorothiazide (BENICAR HCT) 40-25 MG tablet TAKE 1 TABLET BY MOUTH DAILY. 07/07/18   Magrinat, Virgie Dad, MD  Pitavastatin Calcium 4 MG TABS Take 1 tablet (4 mg total) by mouth daily at 12 noon. This may be placebo, study provided. Do not dispense. 05/15/18   Collene Gobble, MD     Family History  Problem Relation Age of Onset  . Heart attack Brother        Massive MI in 8s  . Stroke Brother        CAD  . Kidney disease Mother   . Stroke Mother   . Diabetes Mother   . Liver disease Sister   . COPD Sister        had I-131 rx of hyperthyroidism  . Diabetes Sister   . Stroke Sister   . Lung cancer Paternal Uncle        hx smoking  . Cancer Cousin   . Non-Hodgkin's lymphoma Cousin 25       cancer x3, in prostate and lung- unsure if met/spread or if primaries  . Heart failure Father   .  Heart disease Father   . Arthritis Father   . Sarcoidosis Sister     Social History   Socioeconomic History  . Marital status: Widowed    Spouse name: Not on file  . Number of children: Not on file  . Years of education: Not on file  . Highest education level: Not on file  Occupational History  . Occupation: Paediatric nurse: UNEMPLOYED  Social Needs  . Financial resource strain: Not hard at all  . Food insecurity:    Worry: Never true    Inability: Never true  . Transportation needs:    Medical: No    Non-medical: No  Tobacco Use  . Smoking status: Never Smoker  . Smokeless tobacco: Never Used  Substance and Sexual Activity  . Alcohol use: No  . Drug use: No  . Sexual activity: Not Currently    Birth control/protection: Post-menopausal    Comment: declined condoms  Lifestyle  . Physical activity:    Days per week: 3 days    Minutes per session: 120 min  . Stress: Not at all  Relationships  . Social connections:    Talks on phone: More than three times a week    Gets together: More than three times a  week    Attends religious service: More than 4 times per year    Active member of club or organization: No    Attends meetings of clubs or organizations: Never    Relationship status: Widowed  Other Topics Concern  . Not on file  Social History Narrative   Single/widow   Diet: good   Do you drink/eat things with caffeine? Tea occasionally   Marital status: Widowed  What year were you married? 1993   Do you live in a house, apartment,assisted living, condo,trailer,ect.)? House   Is it one or more stories? Two   How many persons live in your home? 4   Do you have any pets in you home? No   Current or past profession: Tour manager   Do you exercise? Yes  Type&how often: Stationary Bike, Water Exercise 3-4 x week   Do you have a living will? Yes   Do you have a DNR form? No  If not do you what one?   Do you have signed POA/HPOA forms? No   If so, please bring to your appointment.                Review of Systems: A 12 point ROS discussed and pertinent positives are indicated in the HPI above.  All other systems are negative.  Review of Systems  Constitutional: Negative for activity change and fatigue.  Respiratory: Negative for cough and shortness of breath.   Gastrointestinal: Negative for abdominal pain.  Neurological: Negative for weakness.  Psychiatric/Behavioral: Negative for behavioral problems and confusion.    Vital Signs: BP (!) 148/95   Pulse 66   Temp 97.7 F (36.5 C)   Resp 20   Ht _0  (1.702 m)   Wt 270 lb (122.5 kg)   LMP 11/06/2003   SpO2 100%   BMI 42.29 kg/m   Physical Exam Vitals signs reviewed.  Cardiovascular:     Rate and Rhythm: Normal rate and regular rhythm.     Heart sounds: Normal heart sounds.  Pulmonary:     Effort: Pulmonary effort is normal.     Breath sounds: Normal breath sounds.  Abdominal:     General: Bowel sounds are normal.  Palpations: Abdomen is soft.     Tenderness: There is no abdominal tenderness.    Musculoskeletal: Normal range of motion.  Skin:    General: Skin is warm and dry.  Neurological:     General: No focal deficit present.     Mental Status: She is alert and oriented to person, place, and time.  Psychiatric:        Mood and Affect: Mood normal.        Behavior: Behavior normal.        Thought Content: Thought content normal.        Judgment: Judgment normal.     Imaging: Dg Chest 2 View  Result Date: 07/10/2018 CLINICAL DATA:  RIGHT VATS on 05/30/2018. Follow-up RIGHT pleural effusion. EXAM: CHEST - 2 VIEW COMPARISON:  06/19/2018 and earlier. FINDINGS: Interval removal of the RIGHT PleurX catheter. Stable moderate-sized RIGHT pleural effusion which may be partially loculated. Associated consolidation in the RIGHT LOWER LOBE, unchanged. Stable linear atelectasis in the RIGHT MIDDLE LOBE and the INFERIOR RIGHT UPPER LOBE. No new pulmonary parenchymal abnormalities. Cardiomediastinal silhouette unremarkable and unchanged. Mild degenerative changes involving the thoracic spine. IMPRESSION: 1. Interval removal of the RIGHT PleurX catheter. Stable moderate-sized RIGHT pleural effusion which may be partially loculated. Associated passive atelectasis in the RIGHT LOWER LOBE, unchanged. 2. Stable linear atelectasis in the RIGHT MIDDLE LOBE and the INFERIOR RIGHT UPPER LOBE 3. No new abnormalities. Electronically Signed   By: Evangeline Dakin M.D.   On: 07/10/2018 16:21   Dg Chest 2 View  Result Date: 06/19/2018 CLINICAL DATA:  Recurrent right pleural effusion. EXAM: CHEST - 2 VIEW COMPARISON:  06/02/2018 FINDINGS: PleurX catheter remains in place, unchanged. Small right pleural effusion with right mid and lower lung airspace opacities, similar prior study. No pneumothorax. Left lung clear. Mild cardiomegaly. IMPRESSION: Stable airspace opacities and small effusion on the right. No pneumothorax. Mild cardiomegaly. Electronically Signed   By: Rolm Baptise M.D.   On: 06/19/2018 13:13     Labs:  CBC: Recent Labs    05/26/18 0935 05/30/18 1431 05/31/18 0403 06/01/18 0513 07/14/18 0842  WBC 4.9  --  9.5 10.7* 3.8*  HGB 15.2* 13.9 14.0 12.9 13.8  HCT 45.9 41.0 42.7 40.0 41.9  PLT 178  --  183 194 200    COAGS: Recent Labs    05/14/18 1218 05/30/18 1124  INR 0.94 1.05  APTT  --  30    BMP: Recent Labs    05/26/18 0935 05/30/18 1431 05/31/18 0403 06/01/18 0513 06/02/18 0221  NA 144 140 136 138 138  K 4.0 3.4* 3.9 3.7 3.9  CL 103  --  100 102 103  CO2 29  --  _0 GLUCOSE 97  --  168* 119* 106*  BUN 17  --  _1 CALCIUM 10.8*  --  9.8 9.8 9.6  CREATININE 1.58*  --  1.45* 1.27* 1.38*  GFRNONAA 35*  --  38* 45* 41*  GFRAA 40*  --  45* 52* 47*    LIVER FUNCTION TESTS: Recent Labs    12/25/17 1058 05/14/18 1218 05/26/18 0935 06/01/18 0513  BILITOT 0.6 0.4 0.6 0.8  AST 14 13* 13* 15  ALT _2 ALKPHOS  --  63 74 47  PROT 7.6 7.9 8.4* 7.1  ALBUMIN  --  3.4* 3.7 3.1*    TUMOR MARKERS: No results for input(s): AFPTM, CEA, CA199, CHROMGRNA in the last 8760 hours.  Assessment and Plan:  Breast Ca 2005; 2018 Metastatic 2019: pleural based lesion; LAN VATS + Scheduled for port a cath placement Risks and benefits of image guided port-a-catheter placement was discussed with the patient including, but not limited to bleeding, infection, pneumothorax, or fibrin sheath development and need for additional procedures.  All of the patient's questions were answered, patient is agreeable to proceed. Consent signed and in chart.   Thank you for this interesting consult.  I greatly enjoyed meeting Kayla Price and look forward to participating in their care.  A copy of this report was sent to the requesting provider on this date.  Electronically Signed: Lavonia Drafts, PA-C 07/14/2018, 8:58 AM   I spent a total of  30 Minutes   in face to face in clinical consultation, greater than 50% of which was counseling/coordinating  care for [facility]

## 2018-07-14 NOTE — Progress Notes (Signed)
DISCONTINUE OFF PATHWAY REGIMEN - Breast   [id number]:Gemcitabine + Carboplatin (1000/2) q21 Days:   A cycle is every 21 days:     Gemcitabine      Carboplatin   **Always confirm dose/schedule in your pharmacy ordering system**  REASON: Other Reason PRIOR TREATMENT: Off Pathway: Gemcitabine + Carboplatin (1000/2) q21 Days TREATMENT RESPONSE: Unable to Evaluate  START ON PATHWAY REGIMEN - Breast     A cycle is every 28 days:     Atezolizumab      Nab-paclitaxel (protein bound)   **Always confirm dose/schedule in your pharmacy ordering system**  Patient Characteristics: Distant Metastases or Locoregional Recurrent Disease - Unresected or Locally Advanced Unresectable Disease Progressing after Neoadjuvant and Local Therapies, HER2 Negative/Unknown/Equivocal, ER Negative/Unknown, Chemotherapy, First Line, ER Negative,  PD-L1 Expression Positive Therapeutic Status: Distant Metastases BRCA Mutation Status: Did Not Order Test ER Status: Negative (-) HER2 Status: Negative (-) PR Status: Negative (-) Line of Therapy: First Line PD-L1 Expression Status: PD-L1 Positive Intent of Therapy: Non-Curative / Palliative Intent, Discussed with Patient

## 2018-07-14 NOTE — Procedures (Signed)
Interventional Radiology Procedure:   Indications: Metastatic breast cancer  Procedure: Port placement  Findings: Right jugular port, tip at SVC/RA junction  Complications: None     EBL: Minimal  Plan: Port is ready to use.  Discharge to home in one hour.     Kayla Price R. Anselm Pancoast, MD  Pager: [phone]

## 2018-07-15 ENCOUNTER — Encounter: Payer: Self-pay | Admitting: Adult Health

## 2018-07-15 ENCOUNTER — Telehealth: Payer: Self-pay | Admitting: Adult Health

## 2018-07-15 ENCOUNTER — Inpatient Hospital Stay (HOSPITAL_BASED_OUTPATIENT_CLINIC_OR_DEPARTMENT_OTHER): Payer: Medicare HMO | Admitting: Adult Health

## 2018-07-15 ENCOUNTER — Inpatient Hospital Stay: Payer: Medicare HMO | Attending: Adult Health

## 2018-07-15 ENCOUNTER — Inpatient Hospital Stay: Payer: Medicare HMO

## 2018-07-15 VITALS — BP 137/101 | HR 67 | Temp 97.8°F | Resp 18 | Ht 67.0 in | Wt 272.7 lb

## 2018-07-15 DIAGNOSIS — C50911 Malignant neoplasm of unspecified site of right female breast: Secondary | ICD-10-CM

## 2018-07-15 DIAGNOSIS — Z9011 Acquired absence of right breast and nipple: Secondary | ICD-10-CM | POA: Diagnosis not present

## 2018-07-15 DIAGNOSIS — C50411 Malignant neoplasm of upper-outer quadrant of right female breast: Secondary | ICD-10-CM | POA: Diagnosis not present

## 2018-07-15 DIAGNOSIS — Z171 Estrogen receptor negative status [ER-]: Secondary | ICD-10-CM

## 2018-07-15 DIAGNOSIS — I10 Essential (primary) hypertension: Secondary | ICD-10-CM

## 2018-07-15 DIAGNOSIS — Z923 Personal history of irradiation: Secondary | ICD-10-CM

## 2018-07-15 DIAGNOSIS — Z807 Family history of other malignant neoplasms of lymphoid, hematopoietic and related tissues: Secondary | ICD-10-CM

## 2018-07-15 DIAGNOSIS — C773 Secondary and unspecified malignant neoplasm of axilla and upper limb lymph nodes: Secondary | ICD-10-CM | POA: Diagnosis not present

## 2018-07-15 DIAGNOSIS — Z801 Family history of malignant neoplasm of trachea, bronchus and lung: Secondary | ICD-10-CM | POA: Diagnosis not present

## 2018-07-15 DIAGNOSIS — Z9221 Personal history of antineoplastic chemotherapy: Secondary | ICD-10-CM | POA: Insufficient documentation

## 2018-07-15 DIAGNOSIS — Z79899 Other long term (current) drug therapy: Secondary | ICD-10-CM | POA: Diagnosis not present

## 2018-07-15 DIAGNOSIS — Z5112 Encounter for antineoplastic immunotherapy: Secondary | ICD-10-CM | POA: Insufficient documentation

## 2018-07-15 LAB — CBC WITH DIFFERENTIAL (CANCER CENTER ONLY)
Abs Immature Granulocytes: 0 10*3/uL (ref 0.00–0.07)
Basophils Absolute: 0 10*3/uL (ref 0.0–0.1)
Basophils Relative: 1 %
Eosinophils Absolute: 0.1 10*3/uL (ref 0.0–0.5)
Eosinophils Relative: 4 %
HCT: 43 % (ref 36.0–46.0)
Hemoglobin: 14.1 g/dL (ref 12.0–15.0)
IMMATURE GRANULOCYTES: 0 %
Lymphocytes Relative: 30 %
Lymphs Abs: 1.1 10*3/uL (ref 0.7–4.0)
MCH: 32.9 pg (ref 26.0–34.0)
MCHC: 32.8 g/dL (ref 30.0–36.0)
MCV: 100.2 fL — AB (ref 80.0–100.0)
MONOS PCT: 14 %
Monocytes Absolute: 0.5 10*3/uL (ref 0.1–1.0)
Neutro Abs: 1.9 10*3/uL (ref 1.7–7.7)
Neutrophils Relative %: 51 %
Platelet Count: 171 10*3/uL (ref 150–400)
RBC: 4.29 MIL/uL (ref 3.87–5.11)
RDW: 13.8 % (ref 11.5–15.5)
WBC Count: 3.8 10*3/uL — ABNORMAL LOW (ref 4.0–10.5)
nRBC: 0 % (ref 0.0–0.2)

## 2018-07-15 LAB — CMP (CANCER CENTER ONLY)
ALT: 10 U/L (ref 0–44)
AST: 16 U/L (ref 15–41)
Albumin: 3.7 g/dL (ref 3.5–5.0)
Alkaline Phosphatase: 68 U/L (ref 38–126)
Anion gap: 11 (ref 5–15)
BUN: 13 mg/dL (ref 8–23)
CO2: 28 mmol/L (ref 22–32)
Calcium: 10.5 mg/dL — ABNORMAL HIGH (ref 8.9–10.3)
Chloride: 104 mmol/L (ref 98–111)
Creatinine: 1.44 mg/dL — ABNORMAL HIGH (ref 0.44–1.00)
GFR, Est AFR Am: 45 mL/min — ABNORMAL LOW (ref >60)
GFR, Estimated: 39 mL/min — ABNORMAL LOW (ref >60)
Glucose, Bld: 90 mg/dL (ref 70–99)
Potassium: 3.8 mmol/L (ref 3.5–5.1)
Sodium: 143 mmol/L (ref 135–145)
Total Bilirubin: 0.7 mg/dL (ref 0.3–1.2)
Total Protein: 8.2 g/dL — ABNORMAL HIGH (ref 6.5–8.1)

## 2018-07-15 LAB — TSH: TSH: 5.833 u[IU]/mL — ABNORMAL HIGH (ref 0.308–3.960)

## 2018-07-15 MED ORDER — SODIUM CHLORIDE 0.9 % IV SOLN
840.0000 mg | Freq: Once | INTRAVENOUS | Status: AC
  Administered 2018-07-15: 840 mg via INTRAVENOUS
  Filled 2018-07-15: qty 14

## 2018-07-15 MED ORDER — TOBRAMYCIN-DEXAMETHASONE 0.3-0.1 % OP SUSP: 2.0000 [drp] | OPHTHALMIC | 0 refills | Status: DC

## 2018-07-15 MED ORDER — SODIUM CHLORIDE 0.9% FLUSH
10.0000 mL | INTRAVENOUS | Status: DC | PRN
  Administered 2018-07-15: 10 mL
  Filled 2018-07-15: qty 10

## 2018-07-15 MED ORDER — PROCHLORPERAZINE MALEATE 10 MG PO TABS
ORAL_TABLET | ORAL | Status: AC
  Filled 2018-07-15: qty 1

## 2018-07-15 MED ORDER — PROCHLORPERAZINE MALEATE 10 MG PO TABS
10.0000 mg | ORAL_TABLET | Freq: Once | ORAL | Status: AC
  Administered 2018-07-15: 10 mg via ORAL

## 2018-07-15 MED ORDER — SODIUM CHLORIDE 0.9 % IV SOLN
Freq: Once | INTRAVENOUS | Status: AC
  Administered 2018-07-15: 15:00:00 via INTRAVENOUS
  Filled 2018-07-15: qty 250

## 2018-07-15 MED ORDER — LIDOCAINE-PRILOCAINE 2.5-2.5 % EX CREA: TOPICAL_CREAM | Freq: Once | CUTANEOUS | Status: DC

## 2018-07-15 MED ORDER — HEPARIN SOD (PORK) LOCK FLUSH 100 UNIT/ML IV SOLN
500.0000 [IU] | Freq: Once | INTRAVENOUS | Status: AC | PRN
  Administered 2018-07-15: 500 [IU]
  Filled 2018-07-15: qty 5

## 2018-07-15 MED FILL — SELZENTRY 150 MG TABS: 150 | 30 days supply | Qty: 60 | Fill #0

## 2018-07-15 MED FILL — OLMESARTAN-HCTZ 40-25 MG TA: 40-25 | 30 days supply | Qty: 30 | Fill #0

## 2018-07-15 NOTE — Patient Instructions (Signed)
[facility] Discharge Instructions for Patients Receiving Chemotherapy  Today you received the following chemotherapy agents Tecentriq. To help prevent nausea and vomiting after your treatment, we encourage you to take your nausea medication.   If you develop nausea and vomiting that is not controlled by your nausea medication, call the clinic.   BELOW ARE SYMPTOMS THAT SHOULD BE REPORTED IMMEDIATELY:  *FEVER GREATER THAN 100.5 F  *CHILLS WITH OR WITHOUT FEVER  NAUSEA AND VOMITING THAT IS NOT CONTROLLED WITH YOUR NAUSEA MEDICATION  *UNUSUAL SHORTNESS OF BREATH  *UNUSUAL BRUISING OR BLEEDING  TENDERNESS IN MOUTH AND THROAT WITH OR WITHOUT PRESENCE OF ULCERS  *URINARY PROBLEMS  *BOWEL PROBLEMS  UNUSUAL RASH Items with * indicate a potential emergency and should be followed up as soon as possible.  Feel free to call the clinic should you have any questions or concerns. The clinic phone number is [phone].  Please show the [place] at check-in to the Emergency Department and triage nurse.

## 2018-07-15 NOTE — Progress Notes (Signed)
cancer or not.  She is going to need [place]  [phone] [phone]     ID: Kayla Price DOB: [birth year]  MR#: [id number]  IEP#:[id number]  Patient Care Team: Lauree Chandler, NP as PCP - General (Nurse Practitioner) Tommy Medal, Lavell Islam, MD as PCP - Infectious Diseases (Infectious Diseases) Clent Jacks, MD as Consulting Physician (Ophthalmology) Renato Shin, MD as Consulting Physician (Endocrinology) Frederik Pear, MD as Consulting Physician (Orthopedic Surgery) Magrinat, Virgie Dad, MD as Consulting Physician (Oncology) Fanny Skates, MD as Consulting Physician (General Surgery) Donnamae Jude, MD as Consulting Physician (Obstetrics and Gynecology) Delice Bison, Charlestine Massed, NP as Nurse Practitioner (Hematology and Oncology) OTHER MD:  CHIEF COMPLAINT: Triple negative breast cancer, recurrent  CURRENT TREATMENT: Atezolizumab  INTERVAL HISTORY: Kayla Price returns today for follow-up of her recurrent triple negative breast cancer. She is due to receive treatment today.  She was initially supposed to receive Pembrolizumab, however insurance approved Atezolizumab.  She has some shoulder/chest wall residual pain from her previous lung biopsies/thoracenteses.  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  It has been so chronic, she says she hardly notices it and feels like a new normal.  She has no neuropathic pain related to it.  No motor deficits have been noted.  She also has some right eye drainage x 2 weeks that is worsening.  It is irritated.  She denies any vision changes associated with this, and has tried OTC eye gtts without relief.   Kayla Price is feeling well otherwise.  She denies headaches or vision changes.  She denies fevers, chills, bowel/bladder changes, nausea or vomiting.  She denies increased palpitations, cough, shortness of breath.  Her activity level is improving.  She is ready to proceed with  treatment today.  A detailed ROS was otherwise non contributory.     BREAST CANCER HISTORY: From the original intake note:  Kayla Price is a history of right-sided breast cancer dating back to 2005. She had a lumpectomy with sentinel lymph node sampling, chemotherapy, and radiation. I do not have access to those records at present.  More recently she had bilateral screening mammography at the [facility] 09/25/2016 showing a possible mass in the right breast. Diagnostic mammography with ultrasonography on 09/28/2016 the patient underwent right diagnostic mammography with tomography and right breast ultrasonography. The breast density was category A. In the right breast at the 10:00 position there was an irregular mass measuring 2.5 cm. Ultrasound identified this the 10:00 radiant 10 cm from the nipple measuring 2.4 cm. In the right axilla there was an abnormal lymph node measuring 1.3 cm with other normal-appearing lymph nodes.  On 10/01/2016 she  underwent biopsy of the right breast mass in question as well as the suspicious axillary lymph node. 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 The proliferation marker was 70% in the breast lesion and 50% in the lymph node.  Her subsequent history is as detailed below.   PAST MEDICAL HISTORY: Past Medical History:  Diagnosis Date  . Alopecia areata 11/28/2009  . Bell's palsy   . Cancer [facility]) 2005   Breast cancer   chemotherapy and radiation  . CKD (chronic kidney disease) stage 3, GFR 30-59 ml/min (HCC) 06/08/2015  . Dry eye syndrome   . Family history of lung cancer   . Family history of non-Hodgkin's lymphoma   .  Fasting hyperglycemia   . Gestational diabetes    2001  . HIP FRACTURE, RIGHT 05/06/2008  . History of kidney stones   . HIV DISEASE 03/27/2006  . HYPERLIPIDEMIA, MIXED 12/15/2007  .  HYPERTENSION 03/27/2006  . 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 MENORRHAGIA, POSTMENOPAUSAL 02/03/2009  . Osteoarthritis of left knee 06/08/2015  . OSTEOARTHROSIS, LOCAL, SCND, UNSPC SITE 04/07/2007  . PVD 04/07/2007  . Tinea capitis   . TRIGGER FINGER 05/06/2008  . Unspecified vitamin D deficiency 08/06/2007    PAST SURGICAL HISTORY: Past Surgical History:  Procedure Laterality Date  . BREAST LUMPECTOMY Right    2005  . CHEST TUBE INSERTION Right 05/30/2018   Procedure: INSERTION PLEURAL DRAINAGE CATHETER;  Surgeon: Grace Isaac, MD;  Location: [place];  Service: Thoracic;  Laterality: Right;  . COLONOSCOPY    . COLONOSCOPY WITH ESOPHAGOGASTRODUODENOSCOPY (EGD)    . ENDOBRONCHIAL ULTRASOUND Bilateral 05/15/2018   Procedure: ENDOBRONCHIAL ULTRASOUND;  Surgeon: Collene Gobble, MD;  Location: [facility];  Service: Cardiopulmonary;  Laterality: Bilateral;  . FINE NEEDLE ASPIRATION BIOPSY  05/15/2018   Procedure: FINE NEEDLE ASPIRATION BIOPSY;  Surgeon: Collene Gobble, MD;  Location: [facility];  Service: Cardiopulmonary;;  . FLEXIBLE BRONCHOSCOPY  05/15/2018   Procedure: FLEXIBLE BRONCHOSCOPY;  Surgeon: Collene Gobble, MD;  Location: [facility];  Service: Cardiopulmonary;;  . HYSTEROSCOPY  2006  . IR IMAGING GUIDED PORT INSERTION  07/14/2018  . IR THORACENTESIS ASP PLEURAL SPACE W/IMG GUIDE  04/14/2018  . IR THORACENTESIS ASP PLEURAL SPACE W/IMG GUIDE  04/28/2018  . KNEE ARTHROSCOPY Left 2002  . LYMPH NODE DISSECTION  2005  . MASTECTOMY MODIFIED RADICAL Right 11/05/2016   Procedure: RIGHT MASTECTOMY MODIFIED RADICAL;  Surgeon: Fanny Skates, MD;  Location: [place];  Service: General;  Laterality: Right;  . MODIFIED RADICAL MASTECTOMY Right 11/05/2016  . placement   of port-a-cath    . PLEURAL BIOPSY Right 05/30/2018   Procedure: PLEURAL BIOPSY;  Surgeon: Grace Isaac, MD;  Location: [place];  Service: Thoracic;  Laterality: Right;  . PLEURAL EFFUSION DRAINAGE Right  05/30/2018   Procedure: DRAINAGE OF PLEURAL EFFUSION;  Surgeon: Grace Isaac, MD;  Location: [place];  Service: Thoracic;  Laterality: Right;  . PORT-A-CATH REMOVAL  2006   insertion 2005  . PORT-A-CATH REMOVAL N/A 03/29/2017   Procedure: REMOVAL PORT-A-CATH;  Surgeon: Fanny Skates, MD;  Location: [place];  Service: General;  Laterality: N/A;  . PORTACATH PLACEMENT Right 11/05/2016   Procedure: INSERTION PORT-A-CATH WITH ULTRA SOUND;  Surgeon: Fanny Skates, MD;  Location: [place];  Service: General;  Laterality: Right;  . removal of port a cath  12/2014  . TALC PLEURODESIS Right 05/30/2018   Procedure: Pietro Cassis;  Surgeon: Grace Isaac, MD;  Location: [place];  Service: Thoracic;  Laterality: Right;  . THYROID SURGERY  2009   Ablation   . TOTAL KNEE ARTHROPLASTY Left 02/06/2016   Procedure: TOTAL KNEE ARTHROPLASTY;  Surgeon: Frederik Pear, MD;  Location: [place];  Service: Orthopedics;  Laterality: Left;  . TUBAL LIGATION    . VIDEO ASSISTED THORACOSCOPY Right 05/30/2018   Procedure: VIDEO ASSISTED THORACOSCOPY;  Surgeon: Grace Isaac, MD;  Location: [facility];  Service: Thoracic;  Laterality: Right;    FAMILY HISTORY Family History  Problem Relation Age of Onset  . Heart attack Brother        Massive MI in 80s  . Stroke Brother        CAD  . Kidney disease  Mother   . Stroke Mother   . Diabetes Mother   . Liver disease Sister   . COPD Sister        had I-131 rx of hyperthyroidism  . Diabetes Sister   . Stroke Sister   . Lung cancer Paternal Uncle        hx smoking  . Cancer Cousin   . Non-Hodgkin's lymphoma Cousin 25       cancer x3, in prostate and lung- unsure if met/spread or if primaries  . Heart failure Father   . Heart disease Father   . Arthritis Father   . Sarcoidosis Sister   The patient's father died at the age of 19 in the setting of Alzheimer's disease. The patient's mother died at the age of 34 from complications of diabetes. The patient has 3  brothers, 4 sisters. There is no history of breast or ovarian cancer in the family area   GYNECOLOGIC HISTORY:  Patient's last menstrual period was 11/06/2003.  menarche age 82, first live birth age 72, the patient is [place] P2. She stopped having periods in 2005, with her chemotherapy; she never took hormone replacement   SOCIAL HISTORY:  Edana worked for the [employer] more than 20 years. 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 At home she lives with her son Forestine Na, her daughter [name] who works for [employer] and her granddaughter Donita Brooks, 60 y/o AS OF APRIL 2018     ADVANCED DIRECTIVES:  not in place    HEALTH MAINTENANCE: Social History   Tobacco Use  . Smoking status: Never Smoker  . Smokeless tobacco: Never Used  Substance Use Topics  . Alcohol use: No  . Drug use: No     Colonoscopy:  PAP:  Bone density:   Allergies  Allergen Reactions  . Lisinopril Anaphylaxis and Swelling    Swelling of tongue and mouth 11/05/16- tolerates Olmesartan  . Pepcid [Famotidine] Other (See Comments)    PPI H2, BLOCKERS LOWER GASTRIC PH WHICH WOULD LEAD TO SUBTHERAPEUTIC RILPIVIRINE LEVELS AND POTENTIAL VIROLOGICAL FAILURE WITH RESISTANCE  . Prilosec [Omeprazole] Other (See Comments)    PPI H2, BLOCKERS LOWER GASTRIC PH WHICH WOULD LEAD TO SUBTHERAPEUTIC RILPIVIRINE LEVELS AND POTENTIAL VIROLOGICAL FAILURE WITH RESISTANCE  . Tums [Calcium Carbonate Antacid] Other (See Comments)    TUMS ANTACIDS CAN LOWER GASTRIC PH WHICH COULD  LEAD TO SUBTHERAPEUTIC RILPIVIRINE LEVELS AND POTENTIAL VIROLOGICAL FAILURE WITH RESISTANCE TUMS CAN BE GIVEN BUT NEED CONSULT WITH [facility] PHARM[facility]Y RE TIMING. I PREFER HER TO AVOID ALL TOGETHER    Current Outpatient Medications  Medication Sig Dispense Refill  . darunavir-cobicistat (PREZCOBIX) 800-150 MG tablet Take 1 tablet by mouth at bedtime. SWALLOW WHOLE. DO NOT CRUSH, BREAK OR CHEW TABLETS. TAKE WITH FOOD.     . Dolutegravir-Rilpivirine (JULUCA) 50-25 MG TABS Take 1 tablet by mouth at bedtime. Take with the Prezcobix.    Marland Kitchen HYDROcodone-acetaminophen (NORCO/VICODIN) 5-325 MG tablet TAKE ONE TABLET BY MOUTH EVERY 6 HOURS AS NEEDED FOR MODERATE PAIN (Patient taking differently: Take 1 tablet by mouth every 6 (six) hours as needed for moderate pain. ) 60 tablet 0  . hydrocortisone cream 1 % Apply 1 application topically daily as needed for itching.    . levothyroxine (SYNTHROID, LEVOTHROID) 150 MCG tablet Take 1 tablet (150 mcg total) by mouth daily before breakfast. 90 tablet 3  . loratadine (CLARITIN) 10 MG tablet Take 10 mg by mouth daily as needed for  allergies.     . Menthol, Topical Analgesic, (BENGAY EX) Apply 1 application topically daily as needed (muscle pain).    . Naphazoline-Glycerin (REDNESS RELIEF OP) Place 1 drop into both eyes daily as needed (redness).    Marland Kitchen olmesartan-hydrochlorothiazide (BENICAR HCT) 40-25 MG tablet TAKE 1 TABLET BY MOUTH DAILY. 30 tablet 1  . Pitavastatin Calcium 4 MG TABS Take 1 tablet (4 mg total) by mouth daily at 12 noon. This may be placebo, study provided. Do not dispense.    Marland Kitchen SELZENTRY 150 MG tablet TAKE ONE TABLET BY MOUTH TWICE DAILY 60 tablet 5   No current facility-administered medications for this visit.     OBJECTIVE: Really obese African-American woman who appears stated age 33:   07/15/18 0926  BP: (!) 137/101  Pulse: 67  Resp: 18  Temp: 97.8 F (36.6 C)  SpO2: 100%     Body mass index is 42.71 kg/m.    Filed Weights   07/15/18 0926  Weight: 272 lb 11.2 oz (123.7 kg)  ECOG FS: 1 GENERAL: Patient is a well appearing female in no acute distress HEENT:  Sclerae anicteric.  Oropharynx clear and moist. No ulcerations or evidence of oropharyngeal candidiasis. Neck is supple.  NODES:  No cervical, supraclavicular, or axillary lymphadenopathy palpated.  BREAST EXAM:  Right breast s/p mastectomy, no sign of local recurrence, left breast  benign LUNGS:  Clear to auscultation bilaterally.  No wheezes or rhonchi. HEART:  Regular rate and rhythm. No murmur appreciated. ABDOMEN:  Soft, nontender.  Positive, normoactive bowel sounds. No organomegaly palpated. MSK:  No focal spinal tenderness to palpation. Full range of motion bilaterally in the upper extremities. EXTREMITIES:  No peripheral edema.   SKIN:  Clear with no obvious rashes or skin changes. No nail dyscrasia. NEURO:  Nonfocal. Well oriented.  Appropriate affect.     LAB RESULTS:  CMP     Component Value Date/Time   NA 139 07/14/2018 0842   NA 140 05/13/2017 0927   K 3.7 07/14/2018 0842   K 4.1 05/13/2017 0927   CL 101 07/14/2018 0842   CO2 29 07/14/2018 0842   CO2 29 05/13/2017 0927   GLUCOSE 97 07/14/2018 0842   GLUCOSE 102 05/13/2017 0927   BUN 12 07/14/2018 0842   BUN 17.7 05/13/2017 0927   CREATININE 1.51 (H) 07/14/2018 0842   CREATININE 1.58 (H) 05/26/2018 0935   CREATININE 1.48 (H) 04/18/2018 0932   CREATININE 1.4 (H) 05/13/2017 0927   CALCIUM 9.8 07/14/2018 0842   CALCIUM 10.5 (H) 05/13/2017 0927   PROT 7.1 06/01/2018 0513   PROT 8.1 05/13/2017 0927   ALBUMIN 3.1 (L) 06/01/2018 0513   ALBUMIN 3.5 05/13/2017 0927   AST 15 06/01/2018 0513   AST 13 (L) 05/26/2018 0935   AST 14 05/13/2017 0927   ALT 9 06/01/2018 0513   ALT 10 05/26/2018 0935   ALT 12 05/13/2017 0927   ALKPHOS 47 06/01/2018 0513   ALKPHOS 90 05/13/2017 0927   BILITOT 0.8 06/01/2018 0513   BILITOT 0.6 05/26/2018 0935   BILITOT 0.27 05/13/2017 0927   GFRNONAA 37 (L) 07/14/2018 0842   GFRNONAA 35 (L) 05/26/2018 0935   GFRNONAA 38 (L) 04/18/2018 0932   GFRAA 42 (L) 07/14/2018 0842   GFRAA 40 (L) 05/26/2018 0935   GFRAA 44 (L) 04/18/2018 0932    No results found for: TOTALPROTELP, ALBUMINELP, A1GS, A2GS, BETS, BETA2SER, GAMS, MSPIKE, SPEI  No results found for: KPAFRELGTCHN, LAMBDASER, KAPLAMBRATIO  Lab Results  Component Value  Date   WBC 3.8 (L) 07/15/2018    NEUTROABS 1.9 07/15/2018   HGB 14.1 07/15/2018   HCT 43.0 07/15/2018   MCV 100.2 (H) 07/15/2018   PLT 171 07/15/2018      Chemistry      Component Value Date/Time   NA 139 07/14/2018 0842   NA 140 05/13/2017 0927   K 3.7 07/14/2018 0842   K 4.1 05/13/2017 0927   CL 101 07/14/2018 0842   CO2 29 07/14/2018 0842   CO2 29 05/13/2017 0927   BUN 12 07/14/2018 0842   BUN 17.7 05/13/2017 0927   CREATININE 1.51 (H) 07/14/2018 0842   CREATININE 1.58 (H) 05/26/2018 0935   CREATININE 1.48 (H) 04/18/2018 0932   CREATININE 1.4 (H) 05/13/2017 0927   GLU 104 02/13/2016 1358      Component Value Date/Time   CALCIUM 9.8 07/14/2018 0842   CALCIUM 10.5 (H) 05/13/2017 0927   ALKPHOS 47 06/01/2018 0513   ALKPHOS 90 05/13/2017 0927   AST 15 06/01/2018 0513   AST 13 (L) 05/26/2018 0935   AST 14 05/13/2017 0927   ALT 9 06/01/2018 0513   ALT 10 05/26/2018 0935   ALT 12 05/13/2017 0927   BILITOT 0.8 06/01/2018 0513   BILITOT 0.6 05/26/2018 0935   BILITOT 0.27 05/13/2017 0927       Lab Results  Component Value Date   LABCA2 11 04/20/2008    No components found for: [id number]  Recent Labs  Lab 07/14/18 0842  INR 0.98    Urinalysis    Component Value Date/Time   COLORURINE YELLOW 05/30/2018 1123   APPEARANCEUR CLEAR 05/30/2018 1123   LABSPEC 1.017 05/30/2018 1123   PHURINE 6.0 05/30/2018 1123   GLUCOSEU NEGATIVE 05/30/2018 1123   GLUCOSEU NEG mg/dL 07/23/2006 2113   HGBUR NEGATIVE 05/30/2018 1123   BILIRUBINUR NEGATIVE 05/30/2018 1123   KETONESUR NEGATIVE 05/30/2018 1123   PROTEINUR NEGATIVE 05/30/2018 1123   UROBILINOGEN 1 07/23/2006 2113   NITRITE NEGATIVE 05/30/2018 1123   LEUKOCYTESUR NEGATIVE 05/30/2018 1123     STUDIES: Dg Chest 2 View  Result Date: 07/10/2018 CLINICAL DATA:  RIGHT VATS on 05/30/2018. Follow-up RIGHT pleural effusion. EXAM: CHEST - 2 VIEW COMPARISON:  06/19/2018 and earlier. FINDINGS: Interval removal of the RIGHT PleurX catheter. Stable  moderate-sized RIGHT pleural effusion which may be partially loculated. Associated consolidation in the RIGHT LOWER LOBE, unchanged. Stable linear atelectasis in the RIGHT MIDDLE LOBE and the INFERIOR RIGHT UPPER LOBE. No new pulmonary parenchymal abnormalities. Cardiomediastinal silhouette unremarkable and unchanged. Mild degenerative changes involving the thoracic spine. IMPRESSION: 1. Interval removal of the RIGHT PleurX catheter. Stable moderate-sized RIGHT pleural effusion which may be partially loculated. Associated passive atelectasis in the RIGHT LOWER LOBE, unchanged. 2. Stable linear atelectasis in the RIGHT MIDDLE LOBE and the INFERIOR RIGHT UPPER LOBE 3. No new abnormalities. Electronically Signed   By: Evangeline Dakin M.D.   On: 07/10/2018 16:21   Dg Chest 2 View  Result Date: 06/19/2018 CLINICAL DATA:  Recurrent right pleural effusion. EXAM: CHEST - 2 VIEW COMPARISON:  06/02/2018 FINDINGS: PleurX catheter remains in place, unchanged. Small right pleural effusion with right mid and lower lung airspace opacities, similar prior study. No pneumothorax. Left lung clear. Mild cardiomegaly. IMPRESSION: Stable airspace opacities and small effusion on the right. No pneumothorax. Mild cardiomegaly. Electronically Signed   By: Rolm Baptise M.D.   On: 06/19/2018 13:13   Ir Imaging Guided Port Insertion  Result Date: 07/14/2018 INDICATION: 63 year old with metastatic breast cancer.  Port-A-Cath needed for therapy. EXAM: FLUOROSCOPIC AND ULTRASOUND GUIDED PLACEMENT OF A SUBCUTANEOUS PORT COMPARISON:  None. MEDICATIONS: Ancef 3 g; The antibiotic was administered within an appropriate time interval prior to skin puncture. ANESTHESIA/SEDATION: Versed 3.0 mg IV; Fentanyl 200 mcg IV; Moderate Sedation Time:  32 minutes The patient was continuously monitored during the procedure by the interventional radiology nurse under my direct supervision. FLUOROSCOPY TIME:  30 seconds, 6 mGy COMPLICATIONS: None immediate.  PROCEDURE: The procedure, risks, benefits, and alternatives were explained to the patient. Questions regarding the procedure were encouraged and answered. The patient understands and consents to the procedure. Patient was placed supine on the interventional table. Ultrasound confirmed a patent right internal jugular vein. Ultrasound image was saved for documentation. The right chest and neck were cleaned with a skin antiseptic and a sterile drape was placed. Maximal barrier sterile technique was utilized including caps, mask, sterile gowns, sterile gloves, sterile drape, hand hygiene and skin antiseptic. The right neck was anesthetized with 1% lidocaine. Small incision was made in the right neck with a blade. Micropuncture set was placed in the right internal jugular vein with ultrasound guidance. The micropuncture wire was used for measurement purposes. The right chest was anesthetized with 1% lidocaine with epinephrine. #15 blade was used to make an incision and a subcutaneous port pocket was formed. Central was assembled. Subcutaneous tunnel was formed with a stiff tunneling device. The port catheter was brought through the subcutaneous tunnel. The port was placed in the subcutaneous pocket and sutured to the chest wall. The micropuncture set was exchanged for a peel-away sheath. The catheter was placed through the peel-away sheath and the tip was positioned at the superior cavoatrial junction. Catheter placement was confirmed with fluoroscopy. The port was accessed and flushed with heparinized saline. The port pocket was closed using two layers of absorbable sutures and Dermabond. The vein skin site was closed using a single layer of absorbable suture and Dermabond. Sterile dressings were applied. Patient tolerated the procedure well without an immediate complication. Ultrasound and fluoroscopic images were taken and saved for this procedure. IMPRESSION: Placement of a subcutaneous CT injectable port  device. Electronically Signed   By: Markus Daft M.D.   On: 07/14/2018 12:34      ELIGIBLE FOR AVAILABLE RESEARCH PROTOCOL: no  ASSESSMENT: 63 y.o. [place] woman   (1) status post right lumpectomy and sentinel lymph node sampling October 2005 for a 0.6 cm invasive ductal carcinoma involving one out of 2 sentinel lymph nodes sampled, grade 3, triple-negative, treated adjuvantly with doxorubicin and cyclophosphamide 4 followed by weekly paclitaxel 7, followed by adjuvant radiation  RECURRENT DISEASE: (2) status post right breast upper outer quadrant biopsy and right axillary lymph node biopsy 10/01/2016, both positive for a T2 N1, stage IIIB invasive ductal carcinoma, triple negative, with an MIB-1 of 50-70%  (3) status post right modified radical mastectomy 11/05/2016 showing a pT2 pN1, stage IIIB invasive ductal carcinoma, grade 3, triple negative, with negative margins  (4) Not a candidate for radiation given prior history  (5) adjuvant chemotherapy consisting of carboplatin and gemcitabine given days 1 and 8 of each 21 day cycle, for 6 cycles, starting 11/20/2016, completed 03/11/2017  (a) day 8 cycle 2 omitted because of neutropenia; Neupogen/Neulasta added  (5) HIV positivity  (6) Genetic testing 06/17/2017:  no pathogenic mutations. Genes tested: APC, ATM, AXIN2, BARD1, BLM, BMPR1A, BRCA1, BRCA2, BRIP1, CDH1, CDK4, CDKN2A (p14ARF), CDKN2A (p16INK4a), CEBPA, CHEK2, CTNNA1, DICER1, EPCAM*, GATA2, GREM1*, HRAS, KIT,  MEN1, MLH1, MSH2, MSH3, MSH6, MUTYH, NBN, NF1, PALB2, PDGFRA, PMS2, POLD1, POLE, PTEN, RAD50, RAD51C, RAD51D, RUNX1, SDHB, SDHC, SDHD, SMAD4, SMARCA4, STK11, TERC, TERT, TP53, TSC1, TSC2, VHL. The following genes were evaluated for sequence changes only: HOXB13*, NTHL1*, SDHA.Marland Kitchen A variant of uncertain significance (VUS) in a gene called NTHL1 was also noted. c.736G>A (p.Ala246Thr)  METASTATIC DISEASE: November 2019  (1) Patient seen in urgent care and ultimately ER on 10/21  for shortness of breath, chest xray demonstrated Right pleural effusion.   (a) right thoracenteses on 10/21 and 11/4 results show atypical cells, non diagnostic (b) CT chest 04/22/2018 shows re-accumulation of fluid, and right pleural nodularity. (c) PET scan on 05/01/2018 shows hypermetabolic pleural based metastases, right CP angle nodal metastases, no evidence of malignancy in abdomen and pelvis. (d) bronchoscopy with biopsy by Dr. Lamonte Sakai and BAL on 05/15/2018 was non diagnostic  (e) VAT biopsy of the right pleura 05/30/2018 confirms carcinoma, again triple negative  (2) Atezolizumab starting 07/15/2018 given every other week   PLAN:  Kayla Price is here today to start treatment for her newly metastatic triple negative breast cancer.  She is feeling well and her shortness of breath is improving.  She was initially supposed to start on Pembrolizumab, however insurance would approve Abraxane, Atezolizumab.  Dr. Lindi Adie placed these orders and then I reviewed with him her peripheral neuropathy, and Abraxane was subsequently eliminated per his recommendations.  I reviewed with Catha that Huey Bienenstock is given every 2 weeks instead of every 3.    I reviewed her labs with her today in detail.  We also updated her scheduling.  She would like to receive treatment on fridays, so I adjusted this on her schedule.  I also sent in Emla cream for her to apply to her port starting next week.   Ceci has this eye drainage and discomfort.  I sent in Tobradex eye gtts for her to take.  Should this issue continue or worsen, she will likely need ophthalmology evaluation.    Ari will return on 08/01/2018 for labs, f/u, and her next treatment.  She knows to call for any questions or concerns prior to her next treatment.    A total of (30) minutes of face-to-face time was spent with this patient with greater than 50% of that time in counseling and care-coordination.    Wilber Bihari, NP  07/15/18 9:33  AM Medical Oncology and Hematology [facility] [address] [place], [place] [address] Tel. [phone]    Fax. [phone]   Addendum: on 07/16/2018 I reviewed the above with Dr. Jana Hakim and he is in agreement with continuing Atezolizumab alone every 2 weeks.

## 2018-07-15 NOTE — Telephone Encounter (Signed)
Gave avs and calendar ° °

## 2018-07-15 NOTE — Progress Notes (Signed)
First time Tecentriq today. Pt. Tolerated well, with no issues.

## 2018-07-16 ENCOUNTER — Telehealth: Payer: Self-pay | Admitting: *Deleted

## 2018-07-16 DIAGNOSIS — C50911 Malignant neoplasm of unspecified site of right female breast: Secondary | ICD-10-CM

## 2018-07-16 LAB — T4: T4, Total: 10.6 ug/dL (ref 4.5–12.0)

## 2018-07-16 MED ORDER — LIDOCAINE-PRILOCAINE 2.5-2.5 % EX CREA: 1.0000 "application " | TOPICAL_CREAM | CUTANEOUS | 0 refills | Status: DC | PRN

## 2018-07-16 MED FILL — LIDOCAINE-PRILOCAINE CREAM: 2.5-2.5 | 15 days supply | Qty: 30 | Fill #0

## 2018-07-16 NOTE — Telephone Encounter (Signed)
Called patient for chemo follow up. States she is doing very well. No questions or concerns. Eating and drinking well.

## 2018-07-16 NOTE — Telephone Encounter (Signed)
-----   Message from Herschell Dimes, RN sent at 07/15/2018  3:33 PM EST ----- Regarding: Magrinat first time treatment Magrinat - please call Mrs Mirra Basilio and check after her first time Tecentriq

## 2018-07-17 ENCOUNTER — Encounter: Payer: Self-pay | Admitting: Oncology

## 2018-07-17 NOTE — Progress Notes (Signed)
Patient called back to provide neccessary information for copay assistance.  Patient has 2 insurance coverages, therefore copay assistance is not available or needed through [employer].   I confirmed to her per notes and from [place] that her primary insurance authorized and the secondary did not require authorization. She verbalized understanding.

## 2018-07-17 NOTE — Progress Notes (Signed)
Received concern regarding copay.  Called patient to introduce myself as Arboricult[employer]ist and to offer available resources such as copay assistance through [employer].  Advised patient of the income information needed to apply. Patient unsure but will call me back once she gathers the information.  Gave my card for any additional financial questions or concerns.

## 2018-07-18 ENCOUNTER — Other Ambulatory Visit: Payer: Self-pay | Admitting: Oncology

## 2018-07-18 ENCOUNTER — Encounter (INDEPENDENT_AMBULATORY_CARE_PROVIDER_SITE_OTHER): Payer: Self-pay | Admitting: *Deleted

## 2018-07-18 VITALS — BP 144/89 | HR 70 | Temp 97.9°F | Wt 269.2 lb

## 2018-07-18 DIAGNOSIS — Z006 Encounter for examination for normal comparison and control in clinical research program: Secondary | ICD-10-CM

## 2018-07-18 MED ORDER — LORATADINE 10 MG PO TABS: 10.0000 mg | ORAL_TABLET | Freq: Every day | ORAL | 4 refills | Status: DC

## 2018-07-18 MED ORDER — STUDY - REPRIEVE A5332 - PITAVASTATIN 4MG OR PLACEBO TABLET (PI-VAN DAM): 4.0000 mg | ORAL_TABLET | Freq: Every day | ORAL | Status: DC

## 2018-07-18 NOTE — Research (Signed)
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 She got her first treatment with Tenetriq this week and a new PAC placed. The day after she received the med she developed a cold. It is all centered in her head but she is worried it will get worse and move to her chest or turn in to a sinus infection. I sent a message to Dr. Tommy Medal and Dr. Jana Hakim. She will be returning for study in May.

## 2018-07-21 ENCOUNTER — Other Ambulatory Visit: Payer: Self-pay | Admitting: Oncology

## 2018-07-24 MED FILL — JULUCA 50-25 MG TAB: 50-25 | 30 days supply | Qty: 30 | Fill #3

## 2018-07-24 MED FILL — PREZCOBIX 800 MG-150 MG TAB: 800-150 | 30 days supply | Qty: 30 | Fill #3

## 2018-07-30 ENCOUNTER — Other Ambulatory Visit: Payer: Self-pay | Admitting: Nurse Practitioner

## 2018-07-30 DIAGNOSIS — M1990 Unspecified osteoarthritis, unspecified site: Secondary | ICD-10-CM

## 2018-07-30 MED FILL — HYDROCODON-APAP 5-325: 5-325 | 15 days supply | Qty: 60 | Fill #0

## 2018-07-30 NOTE — Telephone Encounter (Signed)
Last filled 06/20/2019  [facility] Database verified and compliance confirmed

## 2018-08-01 ENCOUNTER — Inpatient Hospital Stay: Payer: Medicare HMO

## 2018-08-01 ENCOUNTER — Telehealth: Payer: Self-pay | Admitting: Adult Health

## 2018-08-01 ENCOUNTER — Inpatient Hospital Stay: Payer: Medicare HMO | Attending: Adult Health

## 2018-08-01 ENCOUNTER — Encounter: Payer: Self-pay | Admitting: Adult Health

## 2018-08-01 ENCOUNTER — Inpatient Hospital Stay (HOSPITAL_BASED_OUTPATIENT_CLINIC_OR_DEPARTMENT_OTHER): Payer: Medicare HMO | Admitting: Adult Health

## 2018-08-01 VITALS — BP 120/79 | HR 82 | Temp 97.9°F | Resp 18 | Ht 67.0 in | Wt 268.4 lb

## 2018-08-01 DIAGNOSIS — Z21 Asymptomatic human immunodeficiency virus [HIV] infection status: Secondary | ICD-10-CM

## 2018-08-01 DIAGNOSIS — C782 Secondary malignant neoplasm of pleura: Secondary | ICD-10-CM | POA: Insufficient documentation

## 2018-08-01 DIAGNOSIS — J910 Malignant pleural effusion: Secondary | ICD-10-CM | POA: Diagnosis not present

## 2018-08-01 DIAGNOSIS — C50411 Malignant neoplasm of upper-outer quadrant of right female breast: Secondary | ICD-10-CM

## 2018-08-01 DIAGNOSIS — E890 Postprocedural hypothyroidism: Secondary | ICD-10-CM | POA: Insufficient documentation

## 2018-08-01 DIAGNOSIS — C773 Secondary and unspecified malignant neoplasm of axilla and upper limb lymph nodes: Secondary | ICD-10-CM

## 2018-08-01 DIAGNOSIS — C50911 Malignant neoplasm of unspecified site of right female breast: Secondary | ICD-10-CM

## 2018-08-01 DIAGNOSIS — Z5112 Encounter for antineoplastic immunotherapy: Secondary | ICD-10-CM | POA: Insufficient documentation

## 2018-08-01 DIAGNOSIS — Z95828 Presence of other vascular implants and grafts: Secondary | ICD-10-CM | POA: Insufficient documentation

## 2018-08-01 DIAGNOSIS — C771 Secondary and unspecified malignant neoplasm of intrathoracic lymph nodes: Secondary | ICD-10-CM | POA: Diagnosis not present

## 2018-08-01 DIAGNOSIS — Z171 Estrogen receptor negative status [ER-]: Secondary | ICD-10-CM

## 2018-08-01 DIAGNOSIS — Z9011 Acquired absence of right breast and nipple: Secondary | ICD-10-CM | POA: Diagnosis not present

## 2018-08-01 LAB — CMP (CANCER CENTER ONLY)
ALT: 11 U/L (ref 0–44)
AST: 18 U/L (ref 15–41)
Albumin: 3.5 g/dL (ref 3.5–5.0)
Alkaline Phosphatase: 76 U/L (ref 38–126)
Anion gap: 9 (ref 5–15)
BUN: 14 mg/dL (ref 8–23)
CO2: 30 mmol/L (ref 22–32)
Calcium: 10.3 mg/dL (ref 8.9–10.3)
Chloride: 102 mmol/L (ref 98–111)
Creatinine: 1.62 mg/dL — ABNORMAL HIGH (ref 0.44–1.00)
GFR, Est AFR Am: 39 mL/min — ABNORMAL LOW (ref >60)
GFR, Estimated: 34 mL/min — ABNORMAL LOW (ref >60)
Glucose, Bld: 114 mg/dL — ABNORMAL HIGH (ref 70–99)
Potassium: 3.5 mmol/L (ref 3.5–5.1)
Sodium: 141 mmol/L (ref 135–145)
Total Bilirubin: 0.8 mg/dL (ref 0.3–1.2)
Total Protein: 8 g/dL (ref 6.5–8.1)

## 2018-08-01 LAB — CBC WITH DIFFERENTIAL (CANCER CENTER ONLY)
Abs Immature Granulocytes: 0.01 10*3/uL (ref 0.00–0.07)
BASOS PCT: 1 %
Basophils Absolute: 0 10*3/uL (ref 0.0–0.1)
EOS ABS: 0.1 10*3/uL (ref 0.0–0.5)
Eosinophils Relative: 5 %
HCT: 41.3 % (ref 36.0–46.0)
Hemoglobin: 13.4 g/dL (ref 12.0–15.0)
Immature Granulocytes: 0 %
Lymphocytes Relative: 29 %
Lymphs Abs: 0.8 10*3/uL (ref 0.7–4.0)
MCH: 32 pg (ref 26.0–34.0)
MCHC: 32.4 g/dL (ref 30.0–36.0)
MCV: 98.6 fL (ref 80.0–100.0)
Monocytes Absolute: 0.5 10*3/uL (ref 0.1–1.0)
Monocytes Relative: 16 %
Neutro Abs: 1.4 10*3/uL — ABNORMAL LOW (ref 1.7–7.7)
Neutrophils Relative %: 49 %
Platelet Count: 156 10*3/uL (ref 150–400)
RBC: 4.19 MIL/uL (ref 3.87–5.11)
RDW: 13.3 % (ref 11.5–15.5)
WBC Count: 2.9 10*3/uL — ABNORMAL LOW (ref 4.0–10.5)
nRBC: 0 % (ref 0.0–0.2)

## 2018-08-01 LAB — TSH: TSH: 5.178 u[IU]/mL — ABNORMAL HIGH (ref 0.308–3.960)

## 2018-08-01 MED ORDER — SODIUM CHLORIDE 0.9% FLUSH
10.0000 mL | Freq: Once | INTRAVENOUS | Status: AC
  Administered 2018-08-01: 10 mL
  Filled 2018-08-01: qty 10

## 2018-08-01 MED ORDER — PROCHLORPERAZINE MALEATE 10 MG PO TABS
ORAL_TABLET | ORAL | Status: AC
  Filled 2018-08-01: qty 1

## 2018-08-01 MED ORDER — SODIUM CHLORIDE 0.9 % IV SOLN
840.0000 mg | Freq: Once | INTRAVENOUS | Status: AC
  Administered 2018-08-01: 840 mg via INTRAVENOUS
  Filled 2018-08-01: qty 14

## 2018-08-01 MED ORDER — SODIUM CHLORIDE 0.9 % IV SOLN
Freq: Once | INTRAVENOUS | Status: AC
  Administered 2018-08-01: 13:00:00 via INTRAVENOUS
  Filled 2018-08-01: qty 250

## 2018-08-01 MED ORDER — PROCHLORPERAZINE MALEATE 10 MG PO TABS
10.0000 mg | ORAL_TABLET | Freq: Once | ORAL | Status: AC
  Administered 2018-08-01: 10 mg via ORAL

## 2018-08-01 MED ORDER — SODIUM CHLORIDE 0.9% FLUSH
10.0000 mL | INTRAVENOUS | Status: DC | PRN
  Filled 2018-08-01: qty 10

## 2018-08-01 MED ORDER — HEPARIN SOD (PORK) LOCK FLUSH 100 UNIT/ML IV SOLN
500.0000 [IU] | Freq: Once | INTRAVENOUS | Status: AC | PRN
  Administered 2018-08-01: 500 [IU]
  Filled 2018-08-01: qty 5

## 2018-08-01 NOTE — Progress Notes (Signed)
Per Wilber Bihari, patient is OK to treat with today's labs.

## 2018-08-01 NOTE — Progress Notes (Addendum)
cancer or not.  She is going to need [place]  Telephone:([phone]) [phone] [phone]) [phone]     ID: Kayla Price DOB: June 14, 1962  MR#: [id number]  MGN#:[id number]  Patient Care Team: Lauree Chandler, NP as PCP - General (Nurse Practitioner) Tommy Medal, Lavell Islam, MD as PCP - Infectious Diseases (Infectious Diseases) Clent Jacks, MD as Consulting Physician (Ophthalmology) Renato Shin, MD as Consulting Physician (Endocrinology) Frederik Pear, MD as Consulting Physician (Orthopedic Surgery) Magrinat, Virgie Dad, MD as Consulting Physician (Oncology) Fanny Skates, MD as Consulting Physician (General Surgery) Donnamae Jude, MD as Consulting Physician (Obstetrics and Gynecology) Delice Bison, Charlestine Massed, NP as Nurse Practitioner (Hematology and Oncology) OTHER MD:  CHIEF COMPLAINT: Triple negative breast cancer, recurrent  CURRENT TREATMENT: Atezolizumab  INTERVAL HISTORY: Kayla Price returns today for follow-up of her recurrent triple negative breast cancer. She is due to receive treatment today.  She was initially supposed to receive Pembrolizumab, however insurance approved Atezolizumab.  At her last appointment she was having some eye drainage/irritation and was prescribed Tobradex.  This has improved.  She notes she also had a recent sinusitis, that she has since recovered from.    REVIEW OF SYSTEMS: Prerana tolerated her first treatment with Atezolizumab.  She notes no difficulty with this.  She notes the pain in her chest wall is improving, however the right shoulder pain has persisted.  Occasionally she has right upper arm discomfort.  She has previously had a compression sleeve for her right arm, and this has helped with her arm pain, and she wonders if I could write another prescription for a sleeve.    Dandria denies any fevers, chills, unusual headaches, vision changes, nausea, vomiting, bowel/bladder concerns, fatigue, nausea, vomiting, cough, shortness  of breath.  A detailed ROS was otherwise non contributory.    BREAST CANCER HISTORY: From the original intake note:  Kayla Price is a history of right-sided breast cancer dating back to 2005. She had a lumpectomy with sentinel lymph node sampling, chemotherapy, and radiation. I do not have access to those records at present.  More recently she had bilateral screening mammography at the [facility] 09/25/2016 showing a possible mass in the right breast. Diagnostic mammography with ultrasonography on 09/28/2016 the patient underwent right diagnostic mammography with tomography and right breast ultrasonography. The breast density was category A. In the right breast at the 10:00 position there was an irregular mass measuring 2.5 cm. Ultrasound identified this the 10:00 radiant 10 cm from the nipple measuring 2.4 cm. In the right axilla there was an abnormal lymph node measuring 1.3 cm with other normal-appearing lymph nodes.  On 10/01/2016 she  underwent biopsy of the right breast mass in question as well as the suspicious axillary lymph node. 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 The proliferation marker was 70% in the breast lesion and 50% in the lymph node.  Her subsequent history is as detailed below.   PAST MEDICAL HISTORY: Past Medical History:  Diagnosis Date  . Alopecia areata 11/28/2009  . Bell's palsy   . Cancer [facility]) 2005   Breast cancer   chemotherapy and radiation  . CKD (chronic kidney disease) stage 3, GFR 30-59 ml/min (HCC) 06/08/2015  . Dry eye syndrome   . Family history of lung cancer   . Family history of non-Hodgkin's lymphoma   . Fasting hyperglycemia   . Gestational diabetes  2001  . HIP FRACTURE, RIGHT 05/06/2008  . History of kidney stones   . HIV DISEASE 03/27/2006  . HYPERLIPIDEMIA, MIXED 12/15/2007  .  HYPERTENSION 03/27/2006  . 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 MENORRHAGIA, POSTMENOPAUSAL 02/03/2009  . Osteoarthritis of left knee 06/08/2015  . OSTEOARTHROSIS, LOCAL, SCND, UNSPC SITE 04/07/2007  . PVD 04/07/2007  . Tinea capitis   . TRIGGER FINGER 05/06/2008  . Unspecified vitamin D deficiency 08/06/2007    PAST SURGICAL HISTORY: Past Surgical History:  Procedure Laterality Date  . BREAST LUMPECTOMY Right    2005  . CHEST TUBE INSERTION Right 05/30/2018   Procedure: INSERTION PLEURAL DRAINAGE CATHETER;  Surgeon: Grace Isaac, MD;  Location: [facility];  Service: Thoracic;  Laterality: Right;  . COLONOSCOPY    . COLONOSCOPY WITH ESOPHAGOGASTRODUODENOSCOPY (EGD)    . ENDOBRONCHIAL ULTRASOUND Bilateral 05/15/2018   Procedure: ENDOBRONCHIAL ULTRASOUND;  Surgeon: Collene Gobble, MD;  [facility];  Service: Cardiopulmonary;  Laterality: Bilateral;  . FINE NEEDLE ASPIRATION BIOPSY  05/15/2018   Procedure: FINE NEEDLE ASPIRATION BIOPSY;  Surgeon: Collene Gobble, MD;  [facility];  Service: Cardiopulmonary;;  . FLEXIBLE BRONCHOSCOPY  05/15/2018   Procedure: FLEXIBLE BRONCHOSCOPY;  Surgeon: Collene Gobble, MD;  [facility];  Service: Cardiopulmonary;;  . HYSTEROSCOPY  2006  . IR IMAGING GUIDED PORT INSERTION  07/14/2018  . IR THORACENTESIS ASP PLEURAL SPACE W/IMG GUIDE  04/14/2018  . IR THORACENTESIS ASP PLEURAL SPACE W/IMG GUIDE  04/28/2018  . KNEE ARTHROSCOPY Left 2002  . LYMPH NODE DISSECTION  2005  . MASTECTOMY MODIFIED RADICAL Right 11/05/2016   Procedure: RIGHT MASTECTOMY MODIFIED RADICAL;  Surgeon: Fanny Skates, MD;  Location: [facility];  Service: General;  Laterality: Right;  . MODIFIED RADICAL MASTECTOMY Right 11/05/2016  . placement   of port-a-cath    . PLEURAL BIOPSY Right 05/30/2018   Procedure: PLEURAL BIOPSY;  Surgeon: Grace Isaac, MD;  Location: [facility];  Service: Thoracic;  Laterality: Right;  . PLEURAL EFFUSION DRAINAGE Right  05/30/2018   Procedure: DRAINAGE OF PLEURAL EFFUSION;  Surgeon: Grace Isaac, MD;  Location: [facility];  Service: Thoracic;  Laterality: Right;  . PORT-A-CATH REMOVAL  2006   insertion 2005  . PORT-A-CATH REMOVAL N/A 03/29/2017   Procedure: REMOVAL PORT-A-CATH;  Surgeon: Fanny Skates, MD;  Location: [facility];  Service: General;  Laterality: N/A;  . PORTACATH PLACEMENT Right 11/05/2016   Procedure: INSERTION PORT-A-CATH WITH ULTRA SOUND;  Surgeon: Fanny Skates, MD;  Location: [facility];  Service: General;  Laterality: Right;  . removal of port a cath  12/2014  . TALC PLEURODESIS Right 05/30/2018   Procedure: Pietro Cassis;  Surgeon: Grace Isaac, MD;  Location: [facility];  Service: Thoracic;  Laterality: Right;  . THYROID SURGERY  2009   Ablation   . TOTAL KNEE ARTHROPLASTY Left 02/06/2016   Procedure: TOTAL KNEE ARTHROPLASTY;  Surgeon: Frederik Pear, MD;  Location: [facility];  Service: Orthopedics;  Laterality: Left;  . TUBAL LIGATION    . VIDEO ASSISTED THORACOSCOPY Right 05/30/2018   Procedure: VIDEO ASSISTED THORACOSCOPY;  Surgeon: Grace Isaac, MD;  [facility];  Service: Thoracic;  Laterality: Right;    FAMILY HISTORY Family History  Problem Relation Age of Onset  . Heart attack Brother        Massive MI in 66s  . Stroke Brother        CAD  . Kidney disease Mother   . Stroke Mother   . Diabetes  Mother   . Liver disease Sister   . COPD Sister        had I-131 rx of hyperthyroidism  . Diabetes Sister   . Stroke Sister   . Lung cancer Paternal Uncle        hx smoking  . Cancer Cousin   . Non-Hodgkin's lymphoma Cousin 25       cancer x3, in prostate and lung- unsure if met/spread or if primaries  . Heart failure Father   . Heart disease Father   . Arthritis Father   . Sarcoidosis Sister   The patient's father died at the age of 80 in the setting of Alzheimer's disease. The patient's mother died at the age of 53 from complications of diabetes. The patient has 3  brothers, 4 sisters. There is no history of breast or ovarian cancer in the family area   GYNECOLOGIC HISTORY:  Patient's last menstrual period was 11/06/2003.  menarche age 79, first live birth age 34, the patient is Hendrix P2. She stopped having periods in 2005, with her chemotherapy; she never took hormone replacement   SOCIAL HISTORY:  Eyanna worked for the [employer] more than 20 years. 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 At home she lives with her son Forestine Na, her daughter [name] who works for [employer] and her granddaughter Donita Brooks, 61 y/o AS OF APRIL 2018     ADVANCED DIRECTIVES:  not in place    HEALTH MAINTENANCE: Social History   Tobacco Use  . Smoking status: Never Smoker  . Smokeless tobacco: Never Used  Substance Use Topics  . Alcohol use: No  . Drug use: No     Colonoscopy:  PAP:  Bone density:   Allergies  Allergen Reactions  . Lisinopril Anaphylaxis and Swelling    Swelling of tongue and mouth 11/05/16- tolerates Olmesartan  . Pepcid [Famotidine] Other (See Comments)    PPI H2, BLOCKERS LOWER GASTRIC PH WHICH WOULD LEAD TO SUBTHERAPEUTIC RILPIVIRINE LEVELS AND POTENTIAL VIROLOGICAL FAILURE WITH RESISTANCE  . Prilosec [Omeprazole] Other (See Comments)    PPI H2, BLOCKERS LOWER GASTRIC PH WHICH WOULD LEAD TO SUBTHERAPEUTIC RILPIVIRINE LEVELS AND POTENTIAL VIROLOGICAL FAILURE WITH RESISTANCE  . Tums [Calcium Carbonate Antacid] Other (See Comments)    TUMS ANTACIDS CAN LOWER GASTRIC PH WHICH COULD  LEAD TO SUBTHERAPEUTIC RILPIVIRINE LEVELS AND POTENTIAL VIROLOGICAL FAILURE WITH RESISTANCE TUMS CAN BE GIVEN BUT NEED CONSULT WITH [facility] PHARM[facility]Y RE TIMING. I PREFER HER TO AVOID ALL TOGETHER    Current Outpatient Medications  Medication Sig Dispense Refill  . darunavir-cobicistat (PREZCOBIX) 800-150 MG tablet Take 1 tablet by mouth at bedtime. SWALLOW WHOLE. DO NOT CRUSH, BREAK OR CHEW TABLETS. TAKE WITH FOOD.     . Dolutegravir-Rilpivirine (JULUCA) 50-25 MG TABS Take 1 tablet by mouth at bedtime. Take with the Prezcobix.    Marland Kitchen HYDROcodone-acetaminophen (NORCO/VICODIN) 5-325 MG tablet TAKE ONE TABLET BY MOUTH EVERY 6 HOURS AS NEEDED FOR MODERATE PAIN 60 tablet 0  . hydrocortisone cream 1 % Apply 1 application topically daily as needed for itching.    . levothyroxine (SYNTHROID, LEVOTHROID) 150 MCG tablet Take 1 tablet (150 mcg total) by mouth daily before breakfast. 90 tablet 3  . lidocaine-prilocaine (EMLA) cream Apply 1 application topically as needed. Apply to port site 1 hour prior to access 30 g 0  . loratadine (CLARITIN) 10 MG tablet Take 10 mg by mouth daily as needed for allergies.     Marland Kitchen  loratadine (CLARITIN) 10 MG tablet Take 1 tablet (10 mg total) by mouth daily. 60 tablet 4  . Menthol, Topical Analgesic, (BENGAY EX) Apply 1 application topically daily as needed (muscle pain).    . Naphazoline-Glycerin (REDNESS RELIEF OP) Place 1 drop into both eyes daily as needed (redness).    Marland Kitchen olmesartan-hydrochlorothiazide (BENICAR HCT) 40-25 MG tablet TAKE 1 TABLET BY MOUTH DAILY. 30 tablet 1  . Pitavastatin Calcium 4 MG TABS Take 1 tablet (4 mg total) by mouth daily at 12 noon. This may be placebo, study provided. Do not dispense.    Marland Kitchen SELZENTRY 150 MG tablet TAKE ONE TABLET BY MOUTH TWICE DAILY 60 tablet 5  . Study - REPRIEVE [phone] - pitavastatin 4 mg or placebo tablet Take 1 tablet (4 mg total) by mouth daily.    Marland Kitchen tobramycin-dexamethasone (TOBRADEX) ophthalmic solution Place 2 drops into the right eye every 4 (four) hours while awake. 5 mL 0   No current facility-administered medications for this visit.    Facility-Administered Medications Ordered in Other Visits  Medication Dose Route Frequency Provider Last Rate Last Dose  . sodium chloride flush (NS) 0.9 % injection 10 mL  10 mL Intracatheter Once Magrinat, Virgie Dad, MD        OBJECTIVE: Really obese African-American Price who appears stated  age 70:   08/01/18 1117  BP: 120/79  Pulse: 82  Resp: 18  Temp: 97.9 F (36.6 C)  SpO2: 96%     Body mass index is 42.04 kg/m.    Filed Weights   08/01/18 1117  Weight: 268 lb 6.4 oz (121.7 kg)  ECOG FS: 1 GENERAL: Patient is a well appearing female in no acute distress HEENT:  Sclerae anicteric.  Oropharynx clear and moist. No ulcerations or evidence of oropharyngeal candidiasis. Neck is supple.  NODES:  No cervical, supraclavicular, or axillary lymphadenopathy palpated.  BREAST EXAM:  Right breast s/p mastectomy, no sign of local recurrence, left breast benign LUNGS:  Clear to auscultation bilaterally.  No wheezes or rhonchi. HEART:  Regular rate and rhythm. No murmur appreciated. ABDOMEN:  Soft, nontender.  Positive, normoactive bowel sounds. No organomegaly palpated. MSK:  No focal spinal tenderness to palpation. Full range of motion bilaterally in the upper extremities. EXTREMITIES:  No peripheral edema.   SKIN:  Clear with no obvious rashes or skin changes. No nail dyscrasia. NEURO:  Nonfocal. Well oriented.  Appropriate affect.     LAB RESULTS:  CMP     Component Value Date/Time   NA 143 07/15/2018 0906   NA 140 05/13/2017 0927   K 3.8 07/15/2018 0906   K 4.1 05/13/2017 0927   CL 104 07/15/2018 0906   CO2 28 07/15/2018 0906   CO2 29 05/13/2017 0927   GLUCOSE 90 07/15/2018 0906   GLUCOSE 102 05/13/2017 0927   BUN 13 07/15/2018 0906   BUN 17.7 05/13/2017 0927   CREATININE 1.44 (H) 07/15/2018 0906   CREATININE 1.48 (H) 04/18/2018 0932   CREATININE 1.4 (H) 05/13/2017 0927   CALCIUM 10.5 (H) 07/15/2018 0906   CALCIUM 10.5 (H) 05/13/2017 0927   PROT 8.2 (H) 07/15/2018 0906   PROT 8.1 05/13/2017 0927   ALBUMIN 3.7 07/15/2018 0906   ALBUMIN 3.5 05/13/2017 0927   AST 16 07/15/2018 0906   AST 14 05/13/2017 0927   ALT 10 07/15/2018 0906   ALT 12 05/13/2017 0927   ALKPHOS 68 07/15/2018 0906   ALKPHOS 90 05/13/2017 0927   BILITOT 0.7 07/15/2018 0906  BILITOT 0.27 05/13/2017 0927   GFRNONAA 39 (L) 07/15/2018 0906   GFRNONAA 38 (L) 04/18/2018 0932   GFRAA 45 (L) 07/15/2018 0906   GFRAA 44 (L) 04/18/2018 0932    No results found for: Ronnald Ramp, A1GS, A2GS, BETS, BETA2SER, GAMS, MSPIKE, SPEI  No results found for: Nils Pyle, [facility]  Lab Results  Component Value Date   WBC 2.9 (L) 08/01/2018   NEUTROABS 1.4 (L) 08/01/2018   HGB 13.4 08/01/2018   HCT 41.3 08/01/2018   MCV 98.6 08/01/2018   PLT 156 08/01/2018      Chemistry      Component Value Date/Time   NA 143 07/15/2018 0906   NA 140 05/13/2017 0927   K 3.8 07/15/2018 0906   K 4.1 05/13/2017 0927   CL 104 07/15/2018 0906   CO2 28 07/15/2018 0906   CO2 29 05/13/2017 0927   BUN 13 07/15/2018 0906   BUN 17.7 05/13/2017 0927   CREATININE 1.44 (H) 07/15/2018 0906   CREATININE 1.48 (H) 04/18/2018 0932   CREATININE 1.4 (H) 05/13/2017 0927   GLU 104 02/13/2016 1358      Component Value Date/Time   CALCIUM 10.5 (H) 07/15/2018 0906   CALCIUM 10.5 (H) 05/13/2017 0927   ALKPHOS 68 07/15/2018 0906   ALKPHOS 90 05/13/2017 0927   AST 16 07/15/2018 0906   AST 14 05/13/2017 0927   ALT 10 07/15/2018 0906   ALT 12 05/13/2017 0927   BILITOT 0.7 07/15/2018 0906   BILITOT 0.27 05/13/2017 0927       Lab Results  Component Value Date   LABCA2 11 04/20/2008    No components found for: [id number]  No results for input(s): INR in the last 168 hours.  Urinalysis    Component Value Date/Time   COLORURINE YELLOW 05/30/2018 1123   APPEARANCEUR CLEAR 05/30/2018 1123   LABSPEC 1.017 05/30/2018 1123   PHURINE 6.0 05/30/2018 1123   GLUCOSEU NEGATIVE 05/30/2018 1123   GLUCOSEU NEG mg/dL 07/23/2006 2113   HGBUR NEGATIVE 05/30/2018 1123   BILIRUBINUR NEGATIVE 05/30/2018 [place] 05/30/2018 [place] 05/30/2018 1123   UROBILINOGEN 1 07/23/2006 2113   NITRITE NEGATIVE 05/30/2018 1123   LEUKOCYTESUR NEGATIVE  05/30/2018 1123     STUDIES: Dg Chest 2 View  Result Date: 07/10/2018 CLINICAL DATA:  RIGHT VATS on 05/30/2018. Follow-up RIGHT pleural effusion. EXAM: CHEST - 2 VIEW COMPARISON:  06/19/2018 and earlier. FINDINGS: Interval removal of the RIGHT PleurX catheter. Stable moderate-sized RIGHT pleural effusion which may be partially loculated. Associated consolidation in the RIGHT LOWER LOBE, unchanged. Stable linear atelectasis in the RIGHT MIDDLE LOBE and the INFERIOR RIGHT UPPER LOBE. No new pulmonary parenchymal abnormalities. Cardiomediastinal silhouette unremarkable and unchanged. Mild degenerative changes involving the thoracic spine. IMPRESSION: 1. Interval removal of the RIGHT PleurX catheter. Stable moderate-sized RIGHT pleural effusion which may be partially loculated. Associated passive atelectasis in the RIGHT LOWER LOBE, unchanged. 2. Stable linear atelectasis in the RIGHT MIDDLE LOBE and the INFERIOR RIGHT UPPER LOBE 3. No new abnormalities. Electronically Signed   By: Evangeline Dakin M.D.   On: 07/10/2018 16:21   Ir Imaging Guided Port Insertion  Result Date: 07/14/2018 INDICATION: 63 year old with metastatic breast cancer. Port-A-Cath needed for therapy. EXAM: FLUOROSCOPIC AND ULTRASOUND GUIDED PLACEMENT OF A SUBCUTANEOUS PORT COMPARISON:  None. MEDICATIONS: Ancef 3 g; The antibiotic was administered within an appropriate time interval prior to skin puncture. ANESTHESIA/SEDATION: Versed 3.0 mg IV; Fentanyl 200 mcg IV; Moderate Sedation Time:  32 minutes The patient was continuously monitored during the procedure by the interventional radiology nurse under my direct supervision. FLUOROSCOPY TIME:  30 seconds, 6 mGy COMPLICATIONS: None immediate. PROCEDURE: The procedure, risks, benefits, and alternatives were explained to the patient. Questions regarding the procedure were encouraged and answered. The patient understands and consents to the procedure. Patient was placed supine on the  interventional table. Ultrasound confirmed a patent right internal jugular vein. Ultrasound image was saved for documentation. The right chest and neck were cleaned with a skin antiseptic and a sterile drape was placed. Maximal barrier sterile technique was utilized including caps, mask, sterile gowns, sterile gloves, sterile drape, hand hygiene and skin antiseptic. The right neck was anesthetized with 1% lidocaine. Small incision was made in the right neck with a blade. Micropuncture set was placed in the right internal jugular vein with ultrasound guidance. The micropuncture wire was used for measurement purposes. The right chest was anesthetized with 1% lidocaine with epinephrine. #15 blade was used to make an incision and a subcutaneous port pocket was formed. [place] was assembled. Subcutaneous tunnel was formed with a stiff tunneling device. The port catheter was brought through the subcutaneous tunnel. The port was placed in the subcutaneous pocket and sutured to the chest wall. The micropuncture set was exchanged for a peel-away sheath. The catheter was placed through the peel-away sheath and the tip was positioned at the superior cavoatrial junction. Catheter placement was confirmed with fluoroscopy. The port was accessed and flushed with heparinized saline. The port pocket was closed using two layers of absorbable sutures and Dermabond. The vein skin site was closed using a single layer of absorbable suture and Dermabond. Sterile dressings were applied. Patient tolerated the procedure well without an immediate complication. Ultrasound and fluoroscopic images were taken and saved for this procedure. IMPRESSION: Placement of a subcutaneous CT injectable port device. Electronically Signed   By: Markus Daft M.D.   On: 07/14/2018 12:34      ELIGIBLE FOR AVAILABLE RESEARCH PROTOCOL: no  ASSESSMENT: 63 y.o. Kayla Price   (1) status post right lumpectomy and sentinel lymph node sampling  October 2005 for a 0.6 cm invasive ductal carcinoma involving one out of 2 sentinel lymph nodes sampled, grade 3, triple-negative, treated adjuvantly with doxorubicin and cyclophosphamide 4 followed by weekly paclitaxel 7, followed by adjuvant radiation  RECURRENT DISEASE: (2) status post right breast upper outer quadrant biopsy and right axillary lymph node biopsy 10/01/2016, both positive for a T2 N1, stage IIIB invasive ductal carcinoma, triple negative, with an MIB-1 of 50-70%  (3) status post right modified radical mastectomy 11/05/2016 showing a pT2 pN1, stage IIIB invasive ductal carcinoma, grade 3, triple negative, with negative margins  (4) Not a candidate for radiation given prior history  (5) adjuvant chemotherapy consisting of carboplatin and gemcitabine given days 1 and 8 of each 21 day cycle, for 6 cycles, starting 11/20/2016, completed 03/11/2017  (a) day 8 cycle 2 omitted because of neutropenia; Neupogen/Neulasta added  (5) HIV positivity  (6) Genetic testing 06/17/2017:  no pathogenic mutations. Genes tested: APC, ATM, AXIN2, BARD1, BLM, BMPR1A, BRCA1, BRCA2, BRIP1, CDH1, CDK4, CDKN2A (p14ARF), CDKN2A (p16INK4a), CEBPA, CHEK2, CTNNA1, DICER1, EPCAM*, GATA2, GREM1*, HRAS, KIT, MEN1, MLH1, MSH2, MSH3, MSH6, MUTYH, NBN, NF1, PALB2, PDGFRA, PMS2, POLD1, POLE, PTEN, RAD50, RAD51C, RAD51D, RUNX1, SDHB, SDHC, SDHD, SMAD4, SMARCA4, STK11, TERC, TERT, TP53, TSC1, TSC2, VHL. The following genes were evaluated for sequence changes only: HOXB13*, NTHL1*, SDHA.Marland Kitchen A variant of uncertain significance (  VUS) in a gene called NTHL1 was also noted. c.736G>A (p.Ala246Thr)  METASTATIC DISEASE: November 2019  (1) Patient seen in urgent care and ultimately ER on 10/21 for shortness of breath, chest xray demonstrated Right pleural effusion.   (a) right thoracenteses on 10/21 and 11/4 results show atypical cells, non diagnostic (b) CT chest 04/22/2018 shows re-accumulation of fluid, and right pleural  nodularity. (c) PET scan on 05/01/2018 shows hypermetabolic pleural based metastases, right CP angle nodal metastases, no evidence of malignancy in abdomen and pelvis. (d) bronchoscopy with biopsy by Dr. Lamonte Sakai and BAL on 05/15/2018 was non diagnostic  (e) VAT biopsy of the right pleura 05/30/2018 confirms carcinoma, again triple negative  (2) Atezolizumab starting 07/15/2018 given every other week   PLAN:  Nafisah is doing well today.  She met with myself and Dr. Jana Hakim today to review how she is tolerating her treatment.  We reviewed her recent scans.  Dr. Jana Hakim reviewed that he would like to go ahead and get a PET scan in the next week or so to see where things are.  She will then repeat one in 4-6 months.    Lakima's labs remain stable.  We reviewed that we are following her TSH and she has remained on Synthroid since she has h/o thyroid ablation.    [place] will return every 2 weeks for labs and her treatment with Atezolizumab, we will see her with every other treatment.  She knows to call for any questions or concerns prior to her next treatment.      Wilber Bihari, NP  08/01/18 11:26 AM Medical Oncology and Hematology [facility] [address] [place], [place] [address] Tel. [phone]    Fax. [phone]   ADDENDUM: Amandalynn tolerated her first dose of pembrolizumab with no side effects whatsoever.  Plan is going to be to continue this indefinitely on still and unless we document disease progression  With that in mind we are going to obtain a PET scan within the next week or so which will serve as our new baseline  She is status post thyroidectomy and therefore monitoring of the TSH should be as per the usual routine  We again discussed the many possible even if on likely side effects of this medication.  She knows to call for any other issue that may develop before the next visit.  I personally saw this patient and performed a substantive portion  of this encounter with the listed APP documented above.   Chauncey Cruel, MD Medical Oncology and Hematology [facility] [address] [place], [place] [address] Tel. [phone]    Fax. [phone]

## 2018-08-01 NOTE — Patient Instructions (Signed)
[facility] Discharge Instructions for Patients Receiving Chemotherapy  Today you received the following chemotherapy agents Tecentriq. To help prevent nausea and vomiting after your treatment, we encourage you to take your nausea medication.   If you develop nausea and vomiting that is not controlled by your nausea medication, call the clinic.   BELOW ARE SYMPTOMS THAT SHOULD BE REPORTED IMMEDIATELY:  *FEVER GREATER THAN 100.5 F  *CHILLS WITH OR WITHOUT FEVER  NAUSEA AND VOMITING THAT IS NOT CONTROLLED WITH YOUR NAUSEA MEDICATION  *UNUSUAL SHORTNESS OF BREATH  *UNUSUAL BRUISING OR BLEEDING  TENDERNESS IN MOUTH AND THROAT WITH OR WITHOUT PRESENCE OF ULCERS  *URINARY PROBLEMS  *BOWEL PROBLEMS  UNUSUAL RASH Items with * indicate a potential emergency and should be followed up as soon as possible.  Feel free to call the clinic should you have any questions or concerns. The clinic phone number is [phone].  Please show the [place] at check-in to the Emergency Department and triage nurse.

## 2018-08-01 NOTE — Telephone Encounter (Signed)
No los °

## 2018-08-02 LAB — CANCER ANTIGEN 27.29: CA 27.29: 20.3 U/mL (ref 0.0–38.6)

## 2018-08-02 LAB — T4: T4, Total: 10.3 ug/dL (ref 4.5–12.0)

## 2018-08-07 ENCOUNTER — Encounter: Payer: Self-pay | Admitting: Family

## 2018-08-13 ENCOUNTER — Encounter (HOSPITAL_COMMUNITY): Payer: Self-pay

## 2018-08-13 ENCOUNTER — Ambulatory Visit (HOSPITAL_COMMUNITY)
Admission: RE | Admit: 2018-08-13 | Discharge: 2018-08-13 | Disposition: A | Discharge status: Home / Self Care | Payer: Medicare HMO | Source: Ambulatory Visit | Attending: Adult Health | Admitting: Adult Health

## 2018-08-13 DIAGNOSIS — Z171 Estrogen receptor negative status [ER-]: Secondary | ICD-10-CM | POA: Diagnosis not present

## 2018-08-13 DIAGNOSIS — C50911 Malignant neoplasm of unspecified site of right female breast: Secondary | ICD-10-CM | POA: Insufficient documentation

## 2018-08-13 DIAGNOSIS — J910 Malignant pleural effusion: Secondary | ICD-10-CM | POA: Insufficient documentation

## 2018-08-13 DIAGNOSIS — C50411 Malignant neoplasm of upper-outer quadrant of right female breast: Secondary | ICD-10-CM | POA: Diagnosis not present

## 2018-08-13 MED ORDER — FLUDEOXYGLUCOSE F - 18 (FDG) INJECTION
13.9000 | Freq: Once | INTRAVENOUS | Status: AC | PRN
  Administered 2018-08-13: 13.9 via INTRAVENOUS

## 2018-08-13 MED FILL — OLMESARTAN-HCTZ 40-25 MG TA: 40-25 | 30 days supply | Qty: 30 | Fill #1

## 2018-08-13 MED FILL — SELZENTRY 150 MG TABS: 150 | 30 days supply | Qty: 60 | Fill #1

## 2018-08-14 LAB — GLUCOSE, CAPILLARY: Glucose-Capillary: 85 mg/dL (ref 70–99)

## 2018-08-15 ENCOUNTER — Inpatient Hospital Stay: Payer: Medicare HMO

## 2018-08-15 VITALS — BP 124/78 | HR 81 | Temp 97.9°F | Resp 18

## 2018-08-15 DIAGNOSIS — E890 Postprocedural hypothyroidism: Secondary | ICD-10-CM | POA: Diagnosis not present

## 2018-08-15 DIAGNOSIS — C50411 Malignant neoplasm of upper-outer quadrant of right female breast: Secondary | ICD-10-CM

## 2018-08-15 DIAGNOSIS — Z171 Estrogen receptor negative status [ER-]: Secondary | ICD-10-CM

## 2018-08-15 DIAGNOSIS — C50911 Malignant neoplasm of unspecified site of right female breast: Secondary | ICD-10-CM

## 2018-08-15 DIAGNOSIS — C782 Secondary malignant neoplasm of pleura: Secondary | ICD-10-CM | POA: Diagnosis not present

## 2018-08-15 DIAGNOSIS — C771 Secondary and unspecified malignant neoplasm of intrathoracic lymph nodes: Secondary | ICD-10-CM | POA: Diagnosis not present

## 2018-08-15 DIAGNOSIS — Z5112 Encounter for antineoplastic immunotherapy: Secondary | ICD-10-CM | POA: Diagnosis not present

## 2018-08-15 DIAGNOSIS — C773 Secondary and unspecified malignant neoplasm of axilla and upper limb lymph nodes: Secondary | ICD-10-CM | POA: Diagnosis not present

## 2018-08-15 DIAGNOSIS — Z95828 Presence of other vascular implants and grafts: Secondary | ICD-10-CM

## 2018-08-15 LAB — CMP (CANCER CENTER ONLY)
ALT: 13 U/L (ref 0–44)
AST: 23 U/L (ref 15–41)
Albumin: 3.7 g/dL (ref 3.5–5.0)
Alkaline Phosphatase: 68 U/L (ref 38–126)
Anion gap: 11 (ref 5–15)
BILIRUBIN TOTAL: 0.7 mg/dL (ref 0.3–1.2)
BUN: 16 mg/dL (ref 8–23)
CO2: 26 mmol/L (ref 22–32)
CREATININE: 1.44 mg/dL — AB (ref 0.44–1.00)
Calcium: 10.3 mg/dL (ref 8.9–10.3)
Chloride: 102 mmol/L (ref 98–111)
GFR, Est AFR Am: 45 mL/min — ABNORMAL LOW (ref >60)
GFR, Estimated: 39 mL/min — ABNORMAL LOW (ref >60)
Glucose, Bld: 126 mg/dL — ABNORMAL HIGH (ref 70–99)
Potassium: 3.7 mmol/L (ref 3.5–5.1)
Sodium: 139 mmol/L (ref 135–145)
Total Protein: 8.3 g/dL — ABNORMAL HIGH (ref 6.5–8.1)

## 2018-08-15 LAB — TSH: TSH: 5.427 u[IU]/mL — ABNORMAL HIGH (ref 0.308–3.960)

## 2018-08-15 LAB — CBC WITH DIFFERENTIAL (CANCER CENTER ONLY)
Abs Immature Granulocytes: 0.01 10*3/uL (ref 0.00–0.07)
BASOS PCT: 1 %
Basophils Absolute: 0.1 10*3/uL (ref 0.0–0.1)
Eosinophils Absolute: 0.1 10*3/uL (ref 0.0–0.5)
Eosinophils Relative: 3 %
HCT: 41.3 % (ref 36.0–46.0)
Hemoglobin: 13.6 g/dL (ref 12.0–15.0)
Immature Granulocytes: 0 %
Lymphocytes Relative: 29 %
Lymphs Abs: 1.3 10*3/uL (ref 0.7–4.0)
MCH: 32 pg (ref 26.0–34.0)
MCHC: 32.9 g/dL (ref 30.0–36.0)
MCV: 97.2 fL (ref 80.0–100.0)
Monocytes Absolute: 0.4 10*3/uL (ref 0.1–1.0)
Monocytes Relative: 10 %
Neutro Abs: 2.4 10*3/uL (ref 1.7–7.7)
Neutrophils Relative %: 57 %
PLATELETS: 183 10*3/uL (ref 150–400)
RBC: 4.25 MIL/uL (ref 3.87–5.11)
RDW: 13.3 % (ref 11.5–15.5)
WBC Count: 4.3 10*3/uL (ref 4.0–10.5)
nRBC: 0 % (ref 0.0–0.2)

## 2018-08-15 MED ORDER — SODIUM CHLORIDE 0.9 % IV SOLN
Freq: Once | INTRAVENOUS | Status: AC
  Administered 2018-08-15: 09:00:00 via INTRAVENOUS
  Filled 2018-08-15: qty 250

## 2018-08-15 MED ORDER — PROCHLORPERAZINE MALEATE 10 MG PO TABS
ORAL_TABLET | ORAL | Status: AC
  Filled 2018-08-15: qty 1

## 2018-08-15 MED ORDER — PROCHLORPERAZINE MALEATE 10 MG PO TABS
10.0000 mg | ORAL_TABLET | Freq: Once | ORAL | Status: AC
  Administered 2018-08-15: 10 mg via ORAL

## 2018-08-15 MED ORDER — SODIUM CHLORIDE 0.9% FLUSH
10.0000 mL | INTRAVENOUS | Status: DC | PRN
  Administered 2018-08-15: 10 mL
  Filled 2018-08-15: qty 10

## 2018-08-15 MED ORDER — SODIUM CHLORIDE 0.9 % IV SOLN
840.0000 mg | Freq: Once | INTRAVENOUS | Status: AC
  Administered 2018-08-15: 840 mg via INTRAVENOUS
  Filled 2018-08-15: qty 14

## 2018-08-15 MED ORDER — SODIUM CHLORIDE 0.9% FLUSH
10.0000 mL | Freq: Once | INTRAVENOUS | Status: AC
  Administered 2018-08-15: 10 mL
  Filled 2018-08-15: qty 10

## 2018-08-15 MED ORDER — HEPARIN SOD (PORK) LOCK FLUSH 100 UNIT/ML IV SOLN
500.0000 [IU] | Freq: Once | INTRAVENOUS | Status: AC | PRN
  Administered 2018-08-15: 500 [IU]
  Filled 2018-08-15: qty 5

## 2018-08-15 NOTE — Patient Instructions (Signed)
[facility] Discharge Instructions for Patients Receiving Chemotherapy  Today you received the following chemotherapy agents Tecentriq. To help prevent nausea and vomiting after your treatment, we encourage you to take your nausea medication.   If you develop nausea and vomiting that is not controlled by your nausea medication, call the clinic.   BELOW ARE SYMPTOMS THAT SHOULD BE REPORTED IMMEDIATELY:  *FEVER GREATER THAN 100.5 F  *CHILLS WITH OR WITHOUT FEVER  NAUSEA AND VOMITING THAT IS NOT CONTROLLED WITH YOUR NAUSEA MEDICATION  *UNUSUAL SHORTNESS OF BREATH  *UNUSUAL BRUISING OR BLEEDING  TENDERNESS IN MOUTH AND THROAT WITH OR WITHOUT PRESENCE OF ULCERS  *URINARY PROBLEMS  *BOWEL PROBLEMS  UNUSUAL RASH Items with * indicate a potential emergency and should be followed up as soon as possible.  Feel free to call the clinic should you have any questions or concerns. The clinic phone number is [phone].  Please show the [place] at check-in to the Emergency Department and triage nurse.

## 2018-08-16 LAB — T4: T4, Total: 11 ug/dL (ref 4.5–12.0)

## 2018-08-16 LAB — CANCER ANTIGEN 27.29: CA 27.29: 21.5 U/mL (ref 0.0–38.6)

## 2018-08-17 ENCOUNTER — Other Ambulatory Visit: Payer: Self-pay | Admitting: Oncology

## 2018-08-20 ENCOUNTER — Other Ambulatory Visit: Payer: Self-pay | Admitting: Oncology

## 2018-08-29 ENCOUNTER — Inpatient Hospital Stay: Payer: Medicare HMO | Admitting: Adult Health

## 2018-08-29 ENCOUNTER — Inpatient Hospital Stay: Payer: Medicare HMO

## 2018-08-29 ENCOUNTER — Inpatient Hospital Stay: Payer: Medicare HMO | Attending: Adult Health

## 2018-08-29 VITALS — BP 132/81 | HR 70 | Temp 97.9°F | Resp 18

## 2018-08-29 DIAGNOSIS — Z95828 Presence of other vascular implants and grafts: Secondary | ICD-10-CM

## 2018-08-29 DIAGNOSIS — Z79899 Other long term (current) drug therapy: Secondary | ICD-10-CM | POA: Insufficient documentation

## 2018-08-29 DIAGNOSIS — C50411 Malignant neoplasm of upper-outer quadrant of right female breast: Secondary | ICD-10-CM | POA: Insufficient documentation

## 2018-08-29 DIAGNOSIS — C782 Secondary malignant neoplasm of pleura: Secondary | ICD-10-CM | POA: Insufficient documentation

## 2018-08-29 DIAGNOSIS — C771 Secondary and unspecified malignant neoplasm of intrathoracic lymph nodes: Secondary | ICD-10-CM | POA: Insufficient documentation

## 2018-08-29 DIAGNOSIS — C50911 Malignant neoplasm of unspecified site of right female breast: Secondary | ICD-10-CM

## 2018-08-29 DIAGNOSIS — Z171 Estrogen receptor negative status [ER-]: Secondary | ICD-10-CM

## 2018-08-29 DIAGNOSIS — C773 Secondary and unspecified malignant neoplasm of axilla and upper limb lymph nodes: Secondary | ICD-10-CM | POA: Insufficient documentation

## 2018-08-29 DIAGNOSIS — Z5112 Encounter for antineoplastic immunotherapy: Secondary | ICD-10-CM | POA: Insufficient documentation

## 2018-08-29 LAB — COMPREHENSIVE METABOLIC PANEL
ALT: 18 U/L (ref 0–44)
ANION GAP: 7 (ref 5–15)
AST: 31 U/L (ref 15–41)
Albumin: 3.9 g/dL (ref 3.5–5.0)
Alkaline Phosphatase: 61 U/L (ref 38–126)
BUN: 18 mg/dL (ref 8–23)
CO2: 27 mmol/L (ref 22–32)
Calcium: 10.1 mg/dL (ref 8.9–10.3)
Chloride: 103 mmol/L (ref 98–111)
Creatinine, Ser: 1.53 mg/dL — ABNORMAL HIGH (ref 0.44–1.00)
GFR calc Af Amer: 42 mL/min — ABNORMAL LOW (ref >60)
GFR calc non Af Amer: 36 mL/min — ABNORMAL LOW (ref >60)
Glucose, Bld: 94 mg/dL (ref 70–99)
Potassium: 3.9 mmol/L (ref 3.5–5.1)
Sodium: 137 mmol/L (ref 135–145)
Total Bilirubin: 0.4 mg/dL (ref 0.3–1.2)
Total Protein: 8.1 g/dL (ref 6.5–8.1)

## 2018-08-29 LAB — CBC WITH DIFFERENTIAL (CANCER CENTER ONLY)
Abs Immature Granulocytes: 0.01 10*3/uL (ref 0.00–0.07)
Basophils Absolute: 0.1 10*3/uL (ref 0.0–0.1)
Basophils Relative: 1 %
Eosinophils Absolute: 0.1 10*3/uL (ref 0.0–0.5)
Eosinophils Relative: 3 %
HEMATOCRIT: 42.1 % (ref 36.0–46.0)
Hemoglobin: 14 g/dL (ref 12.0–15.0)
Immature Granulocytes: 0 %
Lymphocytes Relative: 34 %
Lymphs Abs: 1.5 10*3/uL (ref 0.7–4.0)
MCH: 32.5 pg (ref 26.0–34.0)
MCHC: 33.3 g/dL (ref 30.0–36.0)
MCV: 97.7 fL (ref 80.0–100.0)
Monocytes Absolute: 0.6 10*3/uL (ref 0.1–1.0)
Monocytes Relative: 12 %
Neutro Abs: 2.2 10*3/uL (ref 1.7–7.7)
Neutrophils Relative %: 50 %
Platelet Count: 191 10*3/uL (ref 150–400)
RBC: 4.31 MIL/uL (ref 3.87–5.11)
RDW: 13.2 % (ref 11.5–15.5)
WBC Count: 4.5 10*3/uL (ref 4.0–10.5)
nRBC: 0 % (ref 0.0–0.2)

## 2018-08-29 LAB — TSH: TSH: 13.578 u[IU]/mL — ABNORMAL HIGH (ref 0.350–4.500)

## 2018-08-29 MED ORDER — HEPARIN SOD (PORK) LOCK FLUSH 100 UNIT/ML IV SOLN
500.0000 [IU] | Freq: Once | INTRAVENOUS | Status: AC | PRN
  Administered 2018-08-29: 500 [IU]
  Filled 2018-08-29: qty 5

## 2018-08-29 MED ORDER — SODIUM CHLORIDE 0.9 % IV SOLN
Freq: Once | INTRAVENOUS | Status: AC
  Administered 2018-08-29: 14:00:00 via INTRAVENOUS
  Filled 2018-08-29: qty 250

## 2018-08-29 MED ORDER — PROCHLORPERAZINE MALEATE 10 MG PO TABS
ORAL_TABLET | ORAL | Status: AC
  Filled 2018-08-29: qty 1

## 2018-08-29 MED ORDER — SODIUM CHLORIDE 0.9% FLUSH
10.0000 mL | INTRAVENOUS | Status: DC | PRN
  Administered 2018-08-29: 10 mL
  Filled 2018-08-29: qty 10

## 2018-08-29 MED ORDER — PROCHLORPERAZINE MALEATE 10 MG PO TABS
10.0000 mg | ORAL_TABLET | Freq: Once | ORAL | Status: AC
  Administered 2018-08-29: 10 mg via ORAL

## 2018-08-29 MED ORDER — SODIUM CHLORIDE 0.9% FLUSH
10.0000 mL | Freq: Once | INTRAVENOUS | Status: AC
  Administered 2018-08-29: 10 mL
  Filled 2018-08-29: qty 10

## 2018-08-29 MED ORDER — SODIUM CHLORIDE 0.9 % IV SOLN
840.0000 mg | Freq: Once | INTRAVENOUS | Status: AC
  Administered 2018-08-29: 840 mg via INTRAVENOUS
  Filled 2018-08-29: qty 14

## 2018-08-29 MED FILL — JULUCA 50-25 MG TAB: 50-25 | 30 days supply | Qty: 30 | Fill #4

## 2018-08-29 MED FILL — PREZCOBIX 800 MG-150 MG TAB: 800-150 | 30 days supply | Qty: 30 | Fill #4

## 2018-08-29 NOTE — Patient Instructions (Signed)
[facility] Discharge Instructions for Patients Receiving Chemotherapy  Today you received the following chemotherapy agents Tecentriq. To help prevent nausea and vomiting after your treatment, we encourage you to take your nausea medication.   If you develop nausea and vomiting that is not controlled by your nausea medication, call the clinic.   BELOW ARE SYMPTOMS THAT SHOULD BE REPORTED IMMEDIATELY:  *FEVER GREATER THAN 100.5 F  *CHILLS WITH OR WITHOUT FEVER  NAUSEA AND VOMITING THAT IS NOT CONTROLLED WITH YOUR NAUSEA MEDICATION  *UNUSUAL SHORTNESS OF BREATH  *UNUSUAL BRUISING OR BLEEDING  TENDERNESS IN MOUTH AND THROAT WITH OR WITHOUT PRESENCE OF ULCERS  *URINARY PROBLEMS  *BOWEL PROBLEMS  UNUSUAL RASH Items with * indicate a potential emergency and should be followed up as soon as possible.  Feel free to call the clinic should you have any questions or concerns. The clinic phone number is [phone].  Please show the [place] at check-in to the Emergency Department and triage nurse.

## 2018-08-29 NOTE — Progress Notes (Signed)
Per Marjean Donna Causey Pt ok to treat with today's labs

## 2018-08-30 LAB — T4: T4, Total: 9.4 ug/dL (ref 4.5–12.0)

## 2018-09-01 ENCOUNTER — Other Ambulatory Visit: Payer: Self-pay | Admitting: Nurse Practitioner

## 2018-09-01 DIAGNOSIS — M1990 Unspecified osteoarthritis, unspecified site: Secondary | ICD-10-CM

## 2018-09-01 MED FILL — HYDROCODON-APAP 5-325: 5-325 | 15 days supply | Qty: 60 | Fill #0

## 2018-09-01 NOTE — Telephone Encounter (Signed)
[facility] Database verified and compliance confirmed    Last filled 07/30/2018

## 2018-09-04 ENCOUNTER — Other Ambulatory Visit: Payer: Self-pay | Admitting: Oncology

## 2018-09-05 ENCOUNTER — Encounter: Payer: Self-pay | Admitting: Endocrinology

## 2018-09-05 ENCOUNTER — Other Ambulatory Visit: Payer: Self-pay

## 2018-09-05 ENCOUNTER — Ambulatory Visit (INDEPENDENT_AMBULATORY_CARE_PROVIDER_SITE_OTHER): Payer: Medicare HMO | Admitting: Endocrinology

## 2018-09-05 VITALS — BP 146/98 | HR 83 | Ht 67.0 in | Wt 265.0 lb

## 2018-09-05 DIAGNOSIS — E890 Postprocedural hypothyroidism: Secondary | ICD-10-CM

## 2018-09-05 MED ORDER — LEVOTHYROXINE SODIUM 175 MCG PO TABS: 175.0000 ug | ORAL_TABLET | Freq: Every day | ORAL | 1 refills | Status: DC

## 2018-09-05 NOTE — Progress Notes (Signed)
Subjective:    Patient ID: Kayla Price, female    DOB: 1955/12/19, 63 y.o.   MRN: [id number]  HPI Pt returns for f/u of post-RAI hypothyroidism (she had RAI for hyperthyroidism due to [place] in 2009, and now takes synthroid).  she says Synthroid is 150 mcg/d.  She says she never misses it.  pt states she feels well in general, except for fatigue.   Past Medical History:  Diagnosis Date  . Alopecia areata 11/28/2009  . Bell's palsy   . Cancer [facility]) 2005   Breast cancer   chemotherapy and radiation  . CKD (chronic kidney disease) stage 3, GFR 30-59 ml/min (HCC) 06/08/2015  . Dry eye syndrome   . Family history of lung cancer   . Family history of non-Hodgkin's lymphoma   . Fasting hyperglycemia   . Gestational diabetes    2001  . HIP FRACTURE, RIGHT 05/06/2008  . History of kidney stones   . HIV DISEASE 03/27/2006  . HYPERLIPIDEMIA, MIXED 12/15/2007  . HYPERTENSION 03/27/2006  . 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 MENORRHAGIA, POSTMENOPAUSAL 02/03/2009  . Osteoarthritis of left knee 06/08/2015  . OSTEOARTHROSIS, LOCAL, SCND, UNSPC SITE 04/07/2007  . PVD 04/07/2007  . Tinea capitis   . TRIGGER FINGER 05/06/2008  . Unspecified vitamin D deficiency 08/06/2007    Past Surgical History:  Procedure Laterality Date  . BREAST LUMPECTOMY Right    2005  . CHEST TUBE INSERTION Right 05/30/2018   Procedure: INSERTION PLEURAL DRAINAGE CATHETER;  Surgeon: Grace Isaac, MD;  Location: [place];  Service: Thoracic;  Laterality: Right;  . COLONOSCOPY    . COLONOSCOPY WITH ESOPHAGOGASTRODUODENOSCOPY (EGD)    . ENDOBRONCHIAL ULTRASOUND Bilateral 05/15/2018   Procedure: ENDOBRONCHIAL ULTRASOUND;  Surgeon: Collene Gobble, MD;  Location: [facility];  Service: Cardiopulmonary;  Laterality: Bilateral;  . FINE NEEDLE ASPIRATION BIOPSY  05/15/2018   Procedure: FINE NEEDLE ASPIRATION BIOPSY;  Surgeon: Collene Gobble, MD;  Location: [facility];  Service: Cardiopulmonary;;  .  FLEXIBLE BRONCHOSCOPY  05/15/2018   Procedure: FLEXIBLE BRONCHOSCOPY;  Surgeon: Collene Gobble, MD;  Location: [facility];  Service: Cardiopulmonary;;  . HYSTEROSCOPY  2006  . IR IMAGING GUIDED PORT INSERTION  07/14/2018  . IR THORACENTESIS ASP PLEURAL SPACE W/IMG GUIDE  04/14/2018  . IR THORACENTESIS ASP PLEURAL SPACE W/IMG GUIDE  04/28/2018  . KNEE ARTHROSCOPY Left 2002  . LYMPH NODE DISSECTION  2005  . MASTECTOMY MODIFIED RADICAL Right 11/05/2016   Procedure: RIGHT MASTECTOMY MODIFIED RADICAL;  Surgeon: Fanny Skates, MD;  Location: [place];  Service: General;  Laterality: Right;  . MODIFIED RADICAL MASTECTOMY Right 11/05/2016  . placement   of port-a-cath    . PLEURAL BIOPSY Right 05/30/2018   Procedure: PLEURAL BIOPSY;  Surgeon: Grace Isaac, MD;  Location: [place];  Service: Thoracic;  Laterality: Right;  . PLEURAL EFFUSION DRAINAGE Right 05/30/2018   Procedure: DRAINAGE OF PLEURAL EFFUSION;  Surgeon: Grace Isaac, MD;  Location: [place];  Service: Thoracic;  Laterality: Right;  . PORT-A-CATH REMOVAL  2006   insertion 2005  . PORT-A-CATH REMOVAL N/A 03/29/2017   Procedure: REMOVAL PORT-A-CATH;  Surgeon: Fanny Skates, MD;  Location: [place];  Service: General;  Laterality: N/A;  . PORTACATH PLACEMENT Right 11/05/2016   Procedure: INSERTION PORT-A-CATH WITH ULTRA SOUND;  Surgeon: Fanny Skates, MD;  Location: [place];  Service: General;  Laterality: Right;  . removal of port a cath  12/2014  . TALC PLEURODESIS Right 05/30/2018   Procedure:  TALC PLEURADESIS;  Surgeon: Grace Isaac, MD;  Location: [place];  Service: Thoracic;  Laterality: Right;  . THYROID SURGERY  2009   Ablation   . TOTAL KNEE ARTHROPLASTY Left 02/06/2016   Procedure: TOTAL KNEE ARTHROPLASTY;  Surgeon: Frederik Pear, MD;  Location: [place];  Service: Orthopedics;  Laterality: Left;  . TUBAL LIGATION    . VIDEO ASSISTED THORACOSCOPY Right 05/30/2018   Procedure: VIDEO ASSISTED THORACOSCOPY;  Surgeon: Grace Isaac, MD;  Location: [facility];  Service: Thoracic;  Laterality: Right;    Social History   Socioeconomic History  . Marital status: Widowed    Spouse name: Not on file  . Number of children: Not on file  . Years of education: Not on file  . Highest education level: Not on file  Occupational History  . Occupation: Paediatric nurse: UNEMPLOYED  Social Needs  . Financial resource strain: Not hard at all  . Food insecurity:    Worry: Never true    Inability: Never true  . Transportation needs:    Medical: No    Non-medical: No  Tobacco Use  . Smoking status: Never Smoker  . Smokeless tobacco: Never Used  Substance and Sexual Activity  . Alcohol use: No  . Drug use: No  . Sexual activity: Not Currently    Birth control/protection: Post-menopausal    Comment: declined condoms  Lifestyle  . Physical activity:    Days per week: 3 days    Minutes per session: 120 min  . Stress: Not at all  Relationships  . Social connections:    Talks on phone: More than three times a week    Gets together: More than three times a week    Attends religious service: More than 4 times per year    Active member of club or organization: No    Attends meetings of clubs or organizations: Never    Relationship status: Widowed  . Intimate partner violence:    Fear of current or ex partner: No    Emotionally abused: No    Physically abused: No    Forced sexual activity: No  Other Topics Concern  . Not on file  Social History Narrative   Single/widow   Diet: good   Do you drink/eat things with caffeine? Tea occasionally   Marital status: Widowed  What year were you married? 1993   Do you live in a house, apartment,assisted living, condo,trailer,ect.)? House   Is it one or more stories? Two   How many persons live in your home? 4   Do you have any pets in you home? No   Current or past profession: Tour manager   Do you exercise? Yes  Type&how often: Stationary Bike, Water Exercise  3-4 x week   Do you have a living will? Yes   Do you have a DNR form? No  If not do you what one?   Do you have signed POA/HPOA forms? No   If so, please bring to your appointment.                Current Outpatient Medications on File Prior to Visit  Medication Sig Dispense Refill  . darunavir-cobicistat (PREZCOBIX) 800-150 MG tablet Take 1 tablet by mouth at bedtime. SWALLOW WHOLE. DO NOT CRUSH, BREAK OR CHEW TABLETS. TAKE WITH FOOD.    . Dolutegravir-Rilpivirine (JULUCA) 50-25 MG TABS Take 1 tablet by mouth at bedtime. Take with the Prezcobix.    Marland Kitchen  HYDROcodone-acetaminophen (NORCO/VICODIN) 5-325 MG tablet TAKE ONE TABLET BY MOUTH EVERY 6 HOURS AS NEEDED FOR MODERATE PAIN 60 tablet 0  . hydrocortisone cream 1 % Apply 1 application topically daily as needed for itching.    . lidocaine-prilocaine (EMLA) cream Apply 1 application topically as needed. Apply to port site 1 hour prior to access 30 g 0  . loratadine (CLARITIN) 10 MG tablet Take 10 mg by mouth daily as needed for allergies.     Marland Kitchen loratadine (CLARITIN) 10 MG tablet Take 1 tablet (10 mg total) by mouth daily. 60 tablet 4  . Menthol, Topical Analgesic, (BENGAY EX) Apply 1 application topically daily as needed (muscle pain).    . Naphazoline-Glycerin (REDNESS RELIEF OP) Place 1 drop into both eyes daily as needed (redness).    . Pitavastatin Calcium 4 MG TABS Take 1 tablet (4 mg total) by mouth daily at 12 noon. This may be placebo, study provided. Do not dispense.    Marland Kitchen SELZENTRY 150 MG tablet TAKE ONE TABLET BY MOUTH TWICE DAILY 60 tablet 5  . Study - REPRIEVE [phone] - pitavastatin 4 mg or placebo tablet Take 1 tablet (4 mg total) by mouth daily.    Marland Kitchen tobramycin-dexamethasone (TOBRADEX) ophthalmic solution Place 2 drops into the right eye every 4 (four) hours while awake. 5 mL 0  . olmesartan-hydrochlorothiazide (BENICAR HCT) 40-25 MG tablet TAKE 1 TABLET BY MOUTH DAILY. 30 tablet 1   No current facility-administered medications on  file prior to visit.     Allergies  Allergen Reactions  . Lisinopril Anaphylaxis and Swelling    Swelling of tongue and mouth 11/05/16- tolerates Olmesartan  . Pepcid [Famotidine] Other (See Comments)    PPI H2, BLOCKERS LOWER GASTRIC PH WHICH WOULD LEAD TO SUBTHERAPEUTIC RILPIVIRINE LEVELS AND POTENTIAL VIROLOGICAL FAILURE WITH RESISTANCE  . Prilosec [Omeprazole] Other (See Comments)    PPI H2, BLOCKERS LOWER GASTRIC PH WHICH WOULD LEAD TO SUBTHERAPEUTIC RILPIVIRINE LEVELS AND POTENTIAL VIROLOGICAL FAILURE WITH RESISTANCE  . Tums [Calcium Carbonate Antacid] Other (See Comments)    TUMS ANTACIDS CAN LOWER GASTRIC PH WHICH COULD  LEAD TO SUBTHERAPEUTIC RILPIVIRINE LEVELS AND POTENTIAL VIROLOGICAL FAILURE WITH RESISTANCE TUMS CAN BE GIVEN BUT NEED CONSULT WITH [facility] PHARMACY RE TIMING. I PREFER HER TO AVOID ALL TOGETHER    Family History  Problem Relation Age of Onset  . Heart attack Brother        Massive MI in 19s  . Stroke Brother        CAD  . Kidney disease Mother   . Stroke Mother   . Diabetes Mother   . Liver disease Sister   . COPD Sister        had I-131 rx of hyperthyroidism  . Diabetes Sister   . Stroke Sister   . Lung cancer Paternal Uncle        hx smoking  . Cancer Cousin   . Non-Hodgkin's lymphoma Cousin 25       cancer x3, in prostate and lung- unsure if met/spread or if primaries  . Heart failure Father   . Heart disease Father   . Arthritis Father   . Sarcoidosis Sister     BP (!) 146/98 (BP Location: Left Arm, Patient Position: Sitting, Cuff Size: Large)   Pulse 83   Ht _0  (1.702 m)   Wt 265 lb (120.2 kg)   LMP 11/06/2003   SpO2 90%   BMI 41.50 kg/m    Review of Systems Denies dry skin  Objective:   Physical Exam VITAL SIGNS:  See vs page GENERAL: no distress NECK: There is no palpable thyroid enlargement.  No thyroid nodule is palpable.  No palpable lymphadenopathy at the anterior neck.     Lab Results  Component Value Date   TSH  13.578 (H) 08/29/2018   T3TOTAL 271.2 (H) 12/15/2007   T4TOTAL 9.4 08/29/2018        Assessment & Plan:  HTN: is noted today Hypothyroidism: she needs increased rx   Patient Instructions  Your blood pressure is high today.  Please see your primary care provider soon, to have it rechecked I have sent a prescription to your pharmacy, to increase the thyroid pill. It is best to never miss the medication.  However, if you do miss it, next best is to double up the next time.   Please come back for a follow-up appointment in 6 weeks.

## 2018-09-05 NOTE — Patient Instructions (Addendum)
Your blood pressure is high today.  Please see your primary care provider soon, to have it rechecked I have sent a prescription to your pharmacy, to increase the thyroid pill. It is best to never miss the medication.  However, if you do miss it, next best is to double up the next time.   Please come back for a follow-up appointment in 6 weeks.

## 2018-09-08 ENCOUNTER — Telehealth: Payer: Self-pay

## 2018-09-08 MED FILL — SELZENTRY 150 MG TABS: 150 | 30 days supply | Qty: 60 | Fill #2

## 2018-09-08 MED FILL — OLMESARTAN-HCTZ 40-25 MG TA: 40-25 | 30 days supply | Qty: 30 | Fill #0

## 2018-09-08 NOTE — Telephone Encounter (Signed)
Kayla Chandler, NP  Vonna Kotyk A, CMA        Pt needs follow up on blood pressure (suggested by Endocrine) if she would like to make appt.    Appointment has been scheduled for 09/09/2018

## 2018-09-09 ENCOUNTER — Encounter: Payer: Self-pay | Admitting: Nurse Practitioner

## 2018-09-09 ENCOUNTER — Other Ambulatory Visit: Payer: Self-pay

## 2018-09-09 ENCOUNTER — Ambulatory Visit (INDEPENDENT_AMBULATORY_CARE_PROVIDER_SITE_OTHER): Payer: Medicare HMO | Admitting: Nurse Practitioner

## 2018-09-09 VITALS — BP 130/80 | HR 83 | Temp 98.1°F | Ht 67.0 in | Wt 263.0 lb

## 2018-09-09 DIAGNOSIS — M1712 Unilateral primary osteoarthritis, left knee: Secondary | ICD-10-CM | POA: Diagnosis not present

## 2018-09-09 DIAGNOSIS — N183 Chronic kidney disease, stage 3 unspecified: Secondary | ICD-10-CM

## 2018-09-09 DIAGNOSIS — E782 Mixed hyperlipidemia: Secondary | ICD-10-CM

## 2018-09-09 DIAGNOSIS — J910 Malignant pleural effusion: Secondary | ICD-10-CM | POA: Diagnosis not present

## 2018-09-09 DIAGNOSIS — B20 Human immunodeficiency virus [HIV] disease: Secondary | ICD-10-CM | POA: Diagnosis not present

## 2018-09-09 DIAGNOSIS — E890 Postprocedural hypothyroidism: Secondary | ICD-10-CM | POA: Diagnosis not present

## 2018-09-09 NOTE — Progress Notes (Signed)
Careteam: Patient Care Team: Lauree Chandler, NP as PCP - General (Nurse Practitioner) Tommy Medal, Lavell Islam, MD as PCP - Infectious Diseases (Infectious Diseases) Clent Jacks, MD as Consulting Physician (Ophthalmology) Renato Shin, MD as Consulting Physician (Endocrinology) Frederik Pear, MD as Consulting Physician (Orthopedic Surgery) Magrinat, Virgie Dad, MD as Consulting Physician (Oncology) Fanny Skates, MD as Consulting Physician (General Surgery) Donnamae Jude, MD as Consulting Physician (Obstetrics and Gynecology) Gardenia Phlegm, NP as Nurse Practitioner (Hematology and Oncology)  Advanced Directive information    Allergies  Allergen Reactions   Lisinopril Anaphylaxis and Swelling    Swelling of tongue and mouth 11/05/16- tolerates Olmesartan   Pepcid [Famotidine] Other (See Comments)    PPI H2, BLOCKERS LOWER GASTRIC PH WHICH WOULD LEAD TO SUBTHERAPEUTIC RILPIVIRINE LEVELS AND POTENTIAL VIROLOGICAL FAILURE WITH RESISTANCE   Prilosec [Omeprazole] Other (See Comments)    PPI H2, BLOCKERS LOWER GASTRIC PH WHICH WOULD LEAD TO SUBTHERAPEUTIC RILPIVIRINE LEVELS AND POTENTIAL VIROLOGICAL FAILURE WITH RESISTANCE   Tums [Calcium Carbonate Antacid] Other (See Comments)    TUMS ANTACIDS CAN LOWER GASTRIC PH WHICH COULD  LEAD TO SUBTHERAPEUTIC RILPIVIRINE LEVELS AND POTENTIAL VIROLOGICAL FAILURE WITH RESISTANCE TUMS CAN BE GIVEN BUT NEED CONSULT WITH ID PHARMACY RE TIMING. I PREFER HER TO AVOID ALL TOGETHER    Chief Complaint  Patient presents with   Follow-up    Blood pressure follow-up per specialist. Patient states B/P was elevated at specialist due to her rushing, ate high sodium meal, took b/p later than usual and work related stress.      HPI: Patient is a 63 y.o. female seen in the office today for routine follow up.   Hx of Breast CA with malignant plural effusion- pt currently undergoes immunotherapy and following with oncologist, Dr Jana Hakim. S/p  VATS done in  05/30/2018 by Dr. Servando Snare. Incision heals nicely. Pt reports occasional mild pain at the incision. Pt has increased SOB with stairs climbing and walking long distance. SOB resolves with rest.  HTN- Elevated BP at endocrinologist, normal BP today. Pt reported that last BP was abnormal for her due to work stress, taking her BP medication late and have dinner with increased sodium contain. Pt currently takes Olmesartan-hydrochlorothiazide  Ostearthritis- stable condition. Pt still experiences pain in her knees. Pt takes Hydrocodone-apap for pain and has a good response to it.    Hyperlipidemia- pt currently participates in the study for an experimental drug. Pt closely followed by infectious disease and has follow up lipids scheduled.   Review of Systems:  Review of Systems  Constitutional: Negative for chills, fever, malaise/fatigue and weight loss.  HENT: Negative for congestion, ear pain, sinus pain and sore throat.   Eyes: Positive for discharge.       Watery eyes  Respiratory: Negative for cough, shortness of breath and wheezing.   Cardiovascular: Negative for chest pain, palpitations, claudication and leg swelling.  Gastrointestinal: Negative for abdominal pain, constipation, diarrhea, nausea and vomiting.  Genitourinary: Positive for urgency. Negative for dysuria and frequency.  Musculoskeletal: Positive for joint pain.  Skin: Negative for itching and rash.  Neurological: Negative for dizziness, weakness and headaches.    Past Medical History:  Diagnosis Date   Alopecia areata 11/28/2009   Bell's palsy    Cancer ([place]) 2005   Breast cancer   chemotherapy and radiation   CKD (chronic kidney disease) stage 3, GFR 30-59 ml/min (HCC) 06/08/2015   Dry eye syndrome    Family history of lung cancer  Family history of non-Hodgkin's lymphoma    Fasting hyperglycemia    Gestational diabetes    2001   HIP FRACTURE, RIGHT 05/06/2008   History of kidney stones      HIV DISEASE 03/27/2006   HYPERLIPIDEMIA, MIXED 12/15/2007   HYPERTENSION 03/27/2006   HYPOTHYROIDISM, POST-RADIATION 06/28/2008   MENORRHAGIA, POSTMENOPAUSAL 02/03/2009   Osteoarthritis of left knee 06/08/2015   OSTEOARTHROSIS, LOCAL, SCND, UNSPC SITE 04/07/2007   PVD 04/07/2007   Tinea capitis    TRIGGER FINGER 05/06/2008   Unspecified vitamin D deficiency 08/06/2007   Past Surgical History:  Procedure Laterality Date   BREAST LUMPECTOMY Right    2005   CHEST TUBE INSERTION Right 05/30/2018   Procedure: INSERTION PLEURAL DRAINAGE CATHETER;  Surgeon: Grace Isaac, MD;  Location: [facility];  Service: Thoracic;  Laterality: Right;   COLONOSCOPY     COLONOSCOPY WITH ESOPHAGOGASTRODUODENOSCOPY (EGD)     ENDOBRONCHIAL ULTRASOUND Bilateral 05/15/2018   Procedure: ENDOBRONCHIAL ULTRASOUND;  Surgeon: Collene Gobble, MD;  Location: [facility] ENDOSCOPY;  Service: Cardiopulmonary;  Laterality: Bilateral;   FINE NEEDLE ASPIRATION BIOPSY  05/15/2018   Procedure: FINE NEEDLE ASPIRATION BIOPSY;  Surgeon: Collene Gobble, MD;  Location: [facility];  Service: Cardiopulmonary;;   FLEXIBLE BRONCHOSCOPY  05/15/2018   Procedure: FLEXIBLE BRONCHOSCOPY;  Surgeon: Collene Gobble, MD;  Location: [facility];  Service: Cardiopulmonary;;   HYSTEROSCOPY  2006   IR IMAGING GUIDED PORT INSERTION  07/14/2018   IR THORACENTESIS ASP PLEURAL SPACE W/IMG GUIDE  04/14/2018   IR THORACENTESIS ASP PLEURAL SPACE W/IMG GUIDE  04/28/2018   KNEE ARTHROSCOPY Left 2002   LYMPH NODE DISSECTION  2005   MASTECTOMY MODIFIED RADICAL Right 11/05/2016   Procedure: RIGHT MASTECTOMY MODIFIED RADICAL;  Surgeon: Fanny Skates, MD;  Location: [place];  Service: General;  Laterality: Right;   MODIFIED RADICAL MASTECTOMY Right 11/05/2016   placement   of port-a-cath     PLEURAL BIOPSY Right 05/30/2018   Procedure: PLEURAL BIOPSY;  Surgeon: Grace Isaac, MD;  Location: [place];  Service: Thoracic;  Laterality:  Right;   PLEURAL EFFUSION DRAINAGE Right 05/30/2018   Procedure: DRAINAGE OF PLEURAL EFFUSION;  Surgeon: Grace Isaac, MD;  Location: [place];  Service: Thoracic;  Laterality: Right;   PORT-A-CATH REMOVAL  2006   insertion 2005   PORT-A-CATH REMOVAL N/A 03/29/2017   Procedure: REMOVAL PORT-A-CATH;  Surgeon: Fanny Skates, MD;  Location: [place];  Service: General;  Laterality: N/A;   PORTACATH PLACEMENT Right 11/05/2016   Procedure: INSERTION PORT-A-CATH WITH ULTRA SOUND;  Surgeon: Fanny Skates, MD;  Location: [place];  Service: General;  Laterality: Right;   removal of port a cath  12/2014   TALC PLEURODESIS Right 05/30/2018   Procedure: Pietro Cassis;  Surgeon: Grace Isaac, MD;  Location: [place];  Service: Thoracic;  Laterality: Right;   THYROID SURGERY  2009   Ablation    TOTAL KNEE ARTHROPLASTY Left 02/06/2016   Procedure: TOTAL KNEE ARTHROPLASTY;  Surgeon: Frederik Pear, MD;  Location: [place];  Service: Orthopedics;  Laterality: Left;   TUBAL LIGATION     VIDEO ASSISTED THORACOSCOPY Right 05/30/2018   Procedure: VIDEO ASSISTED THORACOSCOPY;  Surgeon: Grace Isaac, MD;  Location[facility] OR;  Service: Thoracic;  Laterality: Right;   Social History:   reports that she has never smoked. She has never used smokeless tobacco. She reports that she does not drink alcohol or use drugs.  Family History  Problem Relation Age of Onset  Heart attack Brother        Massive MI in 1s   Stroke Brother        CAD   Kidney disease Mother    Stroke Mother    Diabetes Mother    Liver disease Sister    COPD Sister        had I-131 rx of hyperthyroidism   Diabetes Sister    Stroke Sister    Lung cancer Paternal Uncle        hx smoking   Cancer Cousin    Non-Hodgkin's lymphoma Cousin 25       cancer x3, in prostate and lung- unsure if met/spread or if primaries   Heart failure Father    Heart disease Father    Arthritis Father    Sarcoidosis Sister      Medications: Patient's Medications  New Prescriptions   No medications on file  Previous Medications   DARUNAVIR-COBICISTAT (PREZCOBIX) 800-150 MG TABLET    Take 1 tablet by mouth at bedtime. SWALLOW WHOLE. DO NOT CRUSH, BREAK OR CHEW TABLETS. TAKE WITH FOOD.   DOLUTEGRAVIR-RILPIVIRINE (JULUCA) 50-25 MG TABS    Take 1 tablet by mouth at bedtime. Take with the Prezcobix.   HYDROCODONE-ACETAMINOPHEN (NORCO/VICODIN) 5-325 MG TABLET    TAKE ONE TABLET BY MOUTH EVERY 6 HOURS AS NEEDED FOR MODERATE PAIN   HYDROCORTISONE CREAM 1 %    Apply 1 application topically daily as needed for itching.   LEVOTHYROXINE (SYNTHROID, LEVOTHROID) 175 MCG TABLET    Take 1 tablet (175 mcg total) by mouth daily before breakfast.   LIDOCAINE-PRILOCAINE (EMLA) CREAM    Apply 1 application topically as needed. Apply to port site 1 hour prior to access   LORATADINE (CLARITIN) 10 MG TABLET    Take 1 tablet (10 mg total) by mouth daily.   MENTHOL, TOPICAL ANALGESIC, (BENGAY EX)    Apply 1 application topically daily as needed (muscle pain).   NAPHAZOLINE-GLYCERIN (REDNESS RELIEF OP)    Place 1 drop into both eyes daily as needed (redness).   OLMESARTAN-HYDROCHLOROTHIAZIDE (BENICAR HCT) 40-25 MG TABLET    TAKE 1 TABLET BY MOUTH DAILY.   PITAVASTATIN CALCIUM 4 MG TABS    Take 1 tablet (4 mg total) by mouth daily at 12 noon. This may be placebo, study provided. Do not dispense.   SELZENTRY 150 MG TABLET    TAKE ONE TABLET BY MOUTH TWICE DAILY   STUDY - REPRIEVE [id number] - PITAVASTATIN 4 MG OR PLACEBO TABLET    Take 1 tablet (4 mg total) by mouth daily.   TOBRAMYCIN-DEXAMETHASONE (TOBRADEX) OPHTHALMIC SOLUTION    Place 2 drops into the right eye every 4 (four) hours while awake.  Modified Medications   No medications on file  Discontinued Medications   LORATADINE (CLARITIN) 10 MG TABLET    Take 10 mg by mouth daily as needed for allergies.      Physical Exam:  Vitals:   09/09/18 1515  BP: 130/80  Pulse: 83  Temp:  98.1 F (36.7 C)  TempSrc: Oral  SpO2: 97%  Weight: 263 lb (119.3 kg)  Height: _0  (1.702 m)   Body mass index is 41.19 kg/m.  Physical Exam Constitutional:      General: She is not in acute distress.    Appearance: She is normal weight. She is not ill-appearing.  HENT:     Head: Normocephalic and atraumatic.     Nose: Nose normal. No congestion or rhinorrhea.  Neck:  Musculoskeletal: No neck rigidity or muscular tenderness.  Cardiovascular:     Rate and Rhythm: Normal rate and regular rhythm.     Pulses: Normal pulses.     Heart sounds: No murmur. No friction rub. No gallop.   Pulmonary:     Effort: Pulmonary effort is normal.     Breath sounds: Normal breath sounds. No wheezing, rhonchi or rales.  Abdominal:     General: Bowel sounds are normal.     Palpations: Abdomen is soft.     Tenderness: There is no abdominal tenderness.  Musculoskeletal: Normal range of motion.     Right lower leg: No edema.     Left lower leg: No edema.  Lymphadenopathy:     Cervical: No cervical adenopathy.  Skin:    General: Skin is warm and dry.     Capillary Refill: Capillary refill takes less than 2 seconds.  Neurological:     General: No focal deficit present.     Mental Status: She is alert and oriented to person, place, and time. Mental status is at baseline.  Psychiatric:        Mood and Affect: Mood normal.        Thought Content: Thought content normal.        Judgment: Judgment normal.     Labs reviewed: Basic Metabolic Panel: Recent Labs    08/01/18 1049 08/15/18 0827 08/29/18 1220  NA 141 139 137  K 3.5 3.7 3.9  CL 102 102 103  CO2 _0 GLUCOSE 114* 126* 94  BUN _1 CREATININE 1.62* 1.44* 1.53*  CALCIUM 10.3 10.3 10.1  TSH 5.178* 5.427* 13.578*   Liver Function Tests: Recent Labs    08/01/18 1049 08/15/18 0827 08/29/18 1220  AST _2 ALT _3 ALKPHOS 76 68 61  BILITOT 0.8 0.7 0.4  PROT 8.0 8.3* 8.1  ALBUMIN 3.5 3.7 3.9   No  results for input(s): LIPASE, AMYLASE in the last 8760 hours. No results for input(s): AMMONIA in the last 8760 hours. CBC: Recent Labs    08/01/18 1049 08/15/18 0827 08/29/18 1220  WBC 2.9* 4.3 4.5  NEUTROABS 1.4* 2.4 2.2  HGB 13.4 13.6 14.0  HCT 41.3 41.3 42.1  MCV 98.6 97.2 97.7  PLT 156 183 191   Lipid Panel: Recent Labs    12/25/17 1058  CHOL 199  HDL 60  LDLCALC 116*  TRIG 115  CHOLHDL 3.3   TSH: Recent Labs    08/01/18 1049 08/15/18 0827 08/29/18 1220  TSH 5.178* 5.427* 13.578*   A1C: Lab Results  Component Value Date   HGBA1C 5.3 04/18/2018     Assessment/Plan 1. CKD (chronic kidney disease) stage 3, GFR 30-59 ml/min (HCC) Stable. Encourage proper hydration and to avoid NSAIDS (Aleve, Advil, Motrin, Ibuprofen)   2. Primary localized osteoarthritis of left knee Ongoing and stable. Pt manages with pain medication and increased mobility that helps to reduce pain.  3. Malignant pleural effusion Vats done by Dr. Roxy Horseman in December 2019. Currently stable condition without worsening shortness of breath or chest pain.   4. Human immunodeficiency virus (HIV) disease ([place]) Stable. Managed by ID, Pt takes Selzentry 150 mg   5. Postablative hypothyroidism Pt saw her endocrinologist last week and levothyroxine was increased to 175 mcg. Pt follows up with Endocrinologist. Follow TSH has been stable.   6. Mixed hyperlipidemia Pt is currently on an experimental drug. Infectious disease follows on her lipid panel  Next appt: in 6 months Hezzie Karim K. Harle Battiest  I personally was present during the history, physical exam and medical decision-making activities of this service and have verified that the service and findings are accurately documented in the students note   [facility] [phone]

## 2018-09-12 ENCOUNTER — Inpatient Hospital Stay: Payer: Medicare HMO

## 2018-09-12 ENCOUNTER — Other Ambulatory Visit: Payer: Self-pay

## 2018-09-12 VITALS — BP 140/62 | HR 85 | Temp 98.0°F | Resp 18

## 2018-09-12 DIAGNOSIS — C50411 Malignant neoplasm of upper-outer quadrant of right female breast: Secondary | ICD-10-CM

## 2018-09-12 DIAGNOSIS — Z79899 Other long term (current) drug therapy: Secondary | ICD-10-CM | POA: Diagnosis not present

## 2018-09-12 DIAGNOSIS — Z5112 Encounter for antineoplastic immunotherapy: Secondary | ICD-10-CM | POA: Diagnosis not present

## 2018-09-12 DIAGNOSIS — Z171 Estrogen receptor negative status [ER-]: Secondary | ICD-10-CM

## 2018-09-12 DIAGNOSIS — C771 Secondary and unspecified malignant neoplasm of intrathoracic lymph nodes: Secondary | ICD-10-CM | POA: Diagnosis not present

## 2018-09-12 DIAGNOSIS — Z95828 Presence of other vascular implants and grafts: Secondary | ICD-10-CM

## 2018-09-12 DIAGNOSIS — C773 Secondary and unspecified malignant neoplasm of axilla and upper limb lymph nodes: Secondary | ICD-10-CM | POA: Diagnosis not present

## 2018-09-12 DIAGNOSIS — C50911 Malignant neoplasm of unspecified site of right female breast: Secondary | ICD-10-CM

## 2018-09-12 DIAGNOSIS — C782 Secondary malignant neoplasm of pleura: Secondary | ICD-10-CM | POA: Diagnosis not present

## 2018-09-12 LAB — CBC WITH DIFFERENTIAL (CANCER CENTER ONLY)
Abs Immature Granulocytes: 0.01 10*3/uL (ref 0.00–0.07)
BASOS PCT: 1 %
Basophils Absolute: 0.1 10*3/uL (ref 0.0–0.1)
Eosinophils Absolute: 0.1 10*3/uL (ref 0.0–0.5)
Eosinophils Relative: 3 %
HCT: 42.1 % (ref 36.0–46.0)
Hemoglobin: 13.7 g/dL (ref 12.0–15.0)
Immature Granulocytes: 0 %
Lymphocytes Relative: 33 %
Lymphs Abs: 1.6 10*3/uL (ref 0.7–4.0)
MCH: 32.3 pg (ref 26.0–34.0)
MCHC: 32.5 g/dL (ref 30.0–36.0)
MCV: 99.3 fL (ref 80.0–100.0)
Monocytes Absolute: 0.6 10*3/uL (ref 0.1–1.0)
Monocytes Relative: 13 %
Neutro Abs: 2.4 10*3/uL (ref 1.7–7.7)
Neutrophils Relative %: 50 %
Platelet Count: 170 10*3/uL (ref 150–400)
RBC: 4.24 MIL/uL (ref 3.87–5.11)
RDW: 13.2 % (ref 11.5–15.5)
WBC Count: 4.7 10*3/uL (ref 4.0–10.5)
nRBC: 0 % (ref 0.0–0.2)

## 2018-09-12 LAB — CMP (CANCER CENTER ONLY)
ALK PHOS: 68 U/L (ref 38–126)
ALT: 14 U/L (ref 0–44)
ANION GAP: 8 (ref 5–15)
AST: 28 U/L (ref 15–41)
Albumin: 3.6 g/dL (ref 3.5–5.0)
BUN: 21 mg/dL (ref 8–23)
CALCIUM: 10.1 mg/dL (ref 8.9–10.3)
CO2: 29 mmol/L (ref 22–32)
Chloride: 103 mmol/L (ref 98–111)
Creatinine: 1.49 mg/dL — ABNORMAL HIGH (ref 0.44–1.00)
GFR, Est AFR Am: 43 mL/min — ABNORMAL LOW (ref >60)
GFR, Estimated: 37 mL/min — ABNORMAL LOW (ref >60)
Glucose, Bld: 85 mg/dL (ref 70–99)
Potassium: 3.9 mmol/L (ref 3.5–5.1)
Sodium: 140 mmol/L (ref 135–145)
TOTAL PROTEIN: 8.3 g/dL — AB (ref 6.5–8.1)
Total Bilirubin: 0.4 mg/dL (ref 0.3–1.2)

## 2018-09-12 LAB — TSH: TSH: 11.695 u[IU]/mL — ABNORMAL HIGH (ref 0.308–3.960)

## 2018-09-12 MED ORDER — SODIUM CHLORIDE 0.9% FLUSH
10.0000 mL | INTRAVENOUS | Status: DC | PRN
  Administered 2018-09-12: 10 mL
  Filled 2018-09-12: qty 10

## 2018-09-12 MED ORDER — PROCHLORPERAZINE MALEATE 10 MG PO TABS
10.0000 mg | ORAL_TABLET | Freq: Once | ORAL | Status: AC
  Administered 2018-09-12: 10 mg via ORAL

## 2018-09-12 MED ORDER — SODIUM CHLORIDE 0.9 % IV SOLN
840.0000 mg | Freq: Once | INTRAVENOUS | Status: AC
  Administered 2018-09-12: 840 mg via INTRAVENOUS
  Filled 2018-09-12: qty 14

## 2018-09-12 MED ORDER — SODIUM CHLORIDE 0.9 % IV SOLN
Freq: Once | INTRAVENOUS | Status: AC
  Administered 2018-09-12: 12:00:00 via INTRAVENOUS
  Filled 2018-09-12: qty 250

## 2018-09-12 MED ORDER — SODIUM CHLORIDE 0.9% FLUSH
10.0000 mL | INTRAVENOUS | Status: DC | PRN
  Administered 2018-09-12: 10 mL via INTRAVENOUS
  Filled 2018-09-12: qty 10

## 2018-09-12 MED ORDER — PROCHLORPERAZINE MALEATE 10 MG PO TABS
ORAL_TABLET | ORAL | Status: AC
  Filled 2018-09-12: qty 1

## 2018-09-12 MED ORDER — HEPARIN SOD (PORK) LOCK FLUSH 100 UNIT/ML IV SOLN
500.0000 [IU] | Freq: Once | INTRAVENOUS | Status: AC | PRN
  Administered 2018-09-12: 500 [IU]
  Filled 2018-09-12: qty 5

## 2018-09-12 NOTE — Patient Instructions (Signed)
[facility] Discharge Instructions for Patients Receiving Chemotherapy  Today you received the following chemotherapy agents Tecentriq. To help prevent nausea and vomiting after your treatment, we encourage you to take your nausea medication.   If you develop nausea and vomiting that is not controlled by your nausea medication, call the clinic.   BELOW ARE SYMPTOMS THAT SHOULD BE REPORTED IMMEDIATELY:  *FEVER GREATER THAN 100.5 F  *CHILLS WITH OR WITHOUT FEVER  NAUSEA AND VOMITING THAT IS NOT CONTROLLED WITH YOUR NAUSEA MEDICATION  *UNUSUAL SHORTNESS OF BREATH  *UNUSUAL BRUISING OR BLEEDING  TENDERNESS IN MOUTH AND THROAT WITH OR WITHOUT PRESENCE OF ULCERS  *URINARY PROBLEMS  *BOWEL PROBLEMS  UNUSUAL RASH Items with * indicate a potential emergency and should be followed up as soon as possible.  Feel free to call the clinic should you have any questions or concerns. The clinic phone number is [phone].  Please show the [place] at check-in to the Emergency Department and triage nurse.

## 2018-09-13 LAB — T4: T4, Total: 10.7 ug/dL (ref 4.5–12.0)

## 2018-09-13 LAB — CANCER ANTIGEN 27.29: CA 27.29: 29.5 U/mL (ref 0.0–38.6)

## 2018-09-16 DIAGNOSIS — H524 Presbyopia: Secondary | ICD-10-CM | POA: Diagnosis not present

## 2018-09-18 ENCOUNTER — Other Ambulatory Visit: Payer: Medicare HMO

## 2018-09-18 ENCOUNTER — Other Ambulatory Visit (HOSPITAL_COMMUNITY)
Admission: RE | Admit: 2018-09-18 | Discharge: 2018-09-18 | Disposition: A | Discharge status: Home / Self Care | Payer: Medicare HMO | Source: Ambulatory Visit | Attending: Infectious Disease | Admitting: Infectious Disease

## 2018-09-18 ENCOUNTER — Other Ambulatory Visit: Payer: Self-pay

## 2018-09-18 DIAGNOSIS — C50411 Malignant neoplasm of upper-outer quadrant of right female breast: Secondary | ICD-10-CM | POA: Diagnosis not present

## 2018-09-18 DIAGNOSIS — Z171 Estrogen receptor negative status [ER-]: Secondary | ICD-10-CM | POA: Insufficient documentation

## 2018-09-18 DIAGNOSIS — B20 Human immunodeficiency virus [HIV] disease: Secondary | ICD-10-CM | POA: Diagnosis not present

## 2018-09-18 DIAGNOSIS — Z79899 Other long term (current) drug therapy: Secondary | ICD-10-CM

## 2018-09-18 DIAGNOSIS — R590 Localized enlarged lymph nodes: Secondary | ICD-10-CM | POA: Insufficient documentation

## 2018-09-19 LAB — URINE CYTOLOGY ANCILLARY ONLY
Chlamydia: NEGATIVE
Neisseria Gonorrhea: NEGATIVE

## 2018-09-19 LAB — T-HELPER CELL (CD4) - (RCID CLINIC ONLY)
CD4 % Helper T Cell: 29 % — ABNORMAL LOW (ref 33–55)
CD4 T Cell Abs: 390 /uL — ABNORMAL LOW (ref 400–2700)

## 2018-09-22 ENCOUNTER — Encounter: Payer: Medicare HMO | Admitting: Family

## 2018-09-22 ENCOUNTER — Ambulatory Visit: Payer: 59

## 2018-09-23 ENCOUNTER — Encounter: Payer: Medicare HMO | Admitting: Family

## 2018-09-23 LAB — CBC WITH DIFFERENTIAL/PLATELET
Absolute Monocytes: 465 cells/uL (ref 200–950)
Basophils Absolute: 51 cells/uL (ref 0–200)
Basophils Relative: 1.1 %
EOS PCT: 2.6 %
Eosinophils Absolute: 120 cells/uL (ref 15–500)
HCT: 43.6 % (ref 35.0–45.0)
Hemoglobin: 14.7 g/dL (ref 11.7–15.5)
Lymphs Abs: 1486 cells/uL (ref 850–3900)
MCH: 32.4 pg (ref 27.0–33.0)
MCHC: 33.7 g/dL (ref 32.0–36.0)
MCV: 96 fL (ref 80.0–100.0)
MPV: 10.1 fL (ref 7.5–12.5)
Monocytes Relative: 10.1 %
Neutro Abs: 2479 cells/uL (ref 1500–7800)
Neutrophils Relative %: 53.9 %
Platelets: 217 10*3/uL (ref 140–400)
RBC: 4.54 10*6/uL (ref 3.80–5.10)
RDW: 12.7 % (ref 11.0–15.0)
Total Lymphocyte: 32.3 %
WBC: 4.6 10*3/uL (ref 3.8–10.8)

## 2018-09-23 LAB — COMPLETE METABOLIC PANEL WITH GFR
AG Ratio: 1.1 (calc) (ref 1.0–2.5)
ALKALINE PHOSPHATASE (APISO): 67 U/L (ref 37–153)
ALT: 14 U/L (ref 6–29)
AST: 27 U/L (ref 10–35)
Albumin: 4.3 g/dL (ref 3.6–5.1)
BUN/Creatinine Ratio: 10 (calc) (ref 6–22)
BUN: 17 mg/dL (ref 7–25)
CHLORIDE: 100 mmol/L (ref 98–110)
CO2: 34 mmol/L — ABNORMAL HIGH (ref 20–32)
Calcium: 10.5 mg/dL — ABNORMAL HIGH (ref 8.6–10.4)
Creat: 1.62 mg/dL — ABNORMAL HIGH (ref 0.50–0.99)
GFR, Est African American: 39 mL/min/{1.73_m2} — ABNORMAL LOW (ref >=60)
GFR, Est Non African American: 34 mL/min/{1.73_m2} — ABNORMAL LOW (ref >=60)
GLUCOSE: 97 mg/dL (ref 65–99)
Globulin: 4 g/dL (calc) — ABNORMAL HIGH (ref 1.9–3.7)
Potassium: 4.1 mmol/L (ref 3.5–5.3)
Sodium: 141 mmol/L (ref 135–146)
Total Bilirubin: 0.5 mg/dL (ref 0.2–1.2)
Total Protein: 8.3 g/dL — ABNORMAL HIGH (ref 6.1–8.1)

## 2018-09-23 LAB — LIPID PANEL
Cholesterol: 179 mg/dL (ref <200)
HDL: 54 mg/dL (ref >=50)
LDL CHOLESTEROL (CALC): 101 mg/dL — AB
Non-HDL Cholesterol (Calc): 125 mg/dL (calc) (ref <130)
Total CHOL/HDL Ratio: 3.3 (calc) (ref <5.0)
Triglycerides: 141 mg/dL (ref <150)

## 2018-09-23 LAB — RPR: RPR Ser Ql: NONREACTIVE

## 2018-09-23 LAB — HIV-1 RNA QUANT-NO REFLEX-BLD
HIV 1 RNA Quant: <20 copies/mL — AB
HIV-1 RNA Quant, Log: <1.3 Log copies/mL — AB

## 2018-09-24 MED FILL — PREZCOBIX 800 MG-150 MG TAB: 800-150 | 30 days supply | Qty: 30 | Fill #5

## 2018-09-24 MED FILL — JULUCA 50-25 MG TAB: 50-25 | 30 days supply | Qty: 30 | Fill #5

## 2018-09-25 NOTE — Progress Notes (Signed)
Patient Care Team: Lauree Chandler, NP as PCP - General (Nurse Practitioner) Tommy Medal, Lavell Islam, MD as PCP - Infectious Diseases (Infectious Diseases) Clent Jacks, MD as Consulting Physician (Ophthalmology) Renato Shin, MD as Consulting Physician (Endocrinology) Frederik Pear, MD as Consulting Physician (Orthopedic Surgery) Magrinat, Virgie Dad, MD as Consulting Physician (Oncology) Fanny Skates, MD as Consulting Physician (General Surgery) Donnamae Jude, MD as Consulting Physician (Obstetrics and Gynecology) Gardenia Phlegm, NP as Nurse Practitioner (Hematology and Oncology)  DIAGNOSIS:    ICD-10-CM   1. Malignant neoplasm of upper-outer quadrant of right breast in female, estrogen receptor negative ([place]) C50.411    Z17.1     SUMMARY OF ONCOLOGIC HISTORY:   Malignant neoplasm of upper-outer quadrant of right breast in female, estrogen receptor negative ([place])   03/2004 Surgery    Right lumpectomy and SLNB for IDC, 0.5cm, 1/2 + SLN, grade 3, ER-, PR-, HER-2 -.  Treated with Doxorubicin and Cyclophosphamide x 4, followed by weekly Paclitaxel x 7 and adjuvant radiation.     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 Lymph node: Positive    11/05/2016 Surgery    Right modified radical mastectomy: IDC, grade 3, 2.6 cm, +LVI, triple negative, margins negative, 2/10 LN + metastases.  T2, N1a    11/20/2016 - 03/11/2017 Adjuvant Chemotherapy    Gemcitabine/Carboplatin given on days 1 and 8 on a 21 day cycle x 6 cycles    07/15/2018 - 07/15/2018 Chemotherapy    The patient had pembrolizumab (KEYTRUDA) 200 mg in sodium chloride 0.9 % 50 mL chemo infusion, 200 mg, Intravenous, Once, 0 of 6 cycles  for chemotherapy treatment.     07/15/2018 -  Chemotherapy    The patient had atezolizumab (TECENTRIQ) 840 mg in sodium chloride 0.9 % 250 mL chemo infusion, 840 mg, Intravenous, Once, 3 of 4 cycles Administration:  840 mg (07/15/2018), 840 mg (08/01/2018), 840 mg (08/15/2018), 840 mg (08/29/2018), 840 mg (09/12/2018)  for chemotherapy treatment.      Recurrent cancer of right breast ([place])   11/05/2016 Initial Diagnosis    Recurrent cancer of right breast ([place])    07/15/2018 -  Chemotherapy    The patient had atezolizumab (TECENTRIQ) 840 mg in sodium chloride 0.9 % 250 mL chemo infusion, 840 mg, Intravenous, Once, 3 of 4 cycles Administration: 840 mg (07/15/2018), 840 mg (08/01/2018), 840 mg (08/15/2018), 840 mg (08/29/2018), 840 mg (09/12/2018)  for chemotherapy treatment.      CHIEF COMPLIANT: Cycle 6   INTERVAL HISTORY: Kayla Price is a 63 y.o. 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  She tells me that she is tolerating treatment well.  She has continued mild chest wall pain, but otherwise is doing well.  She notes the pain is improving.  She says she doesn't have any issues with her treatment.    Kayla Price is in the middle of moving and is stressed out from this. She is also following guidelines regarding social distancing and isolation during the COVID-19 pandemic.    REVIEW OF SYSTEMS:   Review of Systems  Constitutional: Negative for appetite change, chills, fatigue, fever and unexpected weight change.  HENT:   Negative for hearing loss, lump/mass and trouble swallowing.   Eyes: Negative for eye problems and icterus.  Respiratory: Negative for chest tightness, cough and shortness of breath.   Cardiovascular: Negative for chest pain,  leg swelling and palpitations.  Gastrointestinal: Negative for abdominal distention, abdominal pain, constipation, diarrhea, nausea and vomiting.  Endocrine: Negative for hot flashes.  Skin: Negative for itching and rash.  Neurological: Negative for dizziness, extremity weakness, headaches and numbness.  Hematological: Negative for adenopathy. Does not bruise/bleed easily.   Psychiatric/Behavioral: Negative for depression. The patient is not nervous/anxious.     I have reviewed the past medical history, past surgical history, social history and family history with the patient and they are unchanged from previous note.  ALLERGIES:  is allergic to lisinopril; pepcid [famotidine]; prilosec [omeprazole]; and tums [calcium carbonate antacid].  MEDICATIONS:  Current Outpatient Medications  Medication Sig Dispense Refill  . darunavir-cobicistat (PREZCOBIX) 800-150 MG tablet Take 1 tablet by mouth at bedtime. SWALLOW WHOLE. DO NOT CRUSH, BREAK OR CHEW TABLETS. TAKE WITH FOOD.    . Dolutegravir-Rilpivirine (JULUCA) 50-25 MG TABS Take 1 tablet by mouth at bedtime. Take with the Prezcobix.    Marland Kitchen HYDROcodone-acetaminophen (NORCO/VICODIN) 5-325 MG tablet TAKE ONE TABLET BY MOUTH EVERY 6 HOURS AS NEEDED FOR MODERATE PAIN 60 tablet 0  . hydrocortisone cream 1 % Apply 1 application topically daily as needed for itching.    . levothyroxine (SYNTHROID, LEVOTHROID) 175 MCG tablet Take 1 tablet (175 mcg total) by mouth daily before breakfast. 90 tablet 1  . lidocaine-prilocaine (EMLA) cream Apply 1 application topically as needed. Apply to port site 1 hour prior to access 30 g 0  . loratadine (CLARITIN) 10 MG tablet Take 1 tablet (10 mg total) by mouth daily. 60 tablet 4  . Menthol, Topical Analgesic, (BENGAY EX) Apply 1 application topically daily as needed (muscle pain).    . Naphazoline-Glycerin (REDNESS RELIEF OP) Place 1 drop into both eyes daily as needed (redness).    Marland Kitchen olmesartan-hydrochlorothiazide (BENICAR HCT) 40-25 MG tablet TAKE 1 TABLET BY MOUTH DAILY. 30 tablet 1  . Pitavastatin Calcium 4 MG TABS Take 1 tablet (4 mg total) by mouth daily at 12 noon. This may be placebo, study provided. Do not dispense.    Marland Kitchen SELZENTRY 150 MG tablet TAKE ONE TABLET BY MOUTH TWICE DAILY 60 tablet 5  . Study - REPRIEVE [phone] - pitavastatin 4 mg or placebo tablet Take 1 tablet (4 mg total)  by mouth daily.    Marland Kitchen tobramycin-dexamethasone (TOBRADEX) ophthalmic solution Place 2 drops into the right eye every 4 (four) hours while awake. 5 mL 0   No current facility-administered medications for this visit.    Facility-Administered Medications Ordered in Other Visits  Medication Dose Route Frequency Provider Last Rate Last Dose  . heparin lock flush 100 unit/mL  500 Units Intracatheter Once PRN Nicholas Lose, MD      . sodium chloride flush (NS) 0.9 % injection 10 mL  10 mL Intracatheter PRN Nicholas Lose, MD        PHYSICAL EXAMINATION: ECOG PERFORMANCE STATUS: 1 - Symptomatic but completely ambulatory  Vitals:   09/26/18 1316  BP: (!) 139/99  Pulse: 81  Resp: 18  Temp: 98.4 F (36.9 C)  SpO2: 98%   Filed Weights   09/26/18 1316  Weight: 262 lb 1.6 oz (118.9 kg)   GENERAL: Patient is a well appearing female in no acute distress HEENT:  Sclerae anicteric.  Oropharynx clear and moist. No ulcerations or evidence of oropharyngeal candidiasis. Neck is supple.  NODES:  No cervical, supraclavicular, or axillary lymphadenopathy palpated.  BREAST EXAM:  Deferred. LUNGS:  Clear to auscultation bilaterally.  No wheezes or rhonchi. HEART:  Regular rate and rhythm. No murmur appreciated. ABDOMEN:  Soft, nontender.  Positive, normoactive bowel sounds. No organomegaly palpated. MSK:  No focal spinal tenderness to palpation. Full range of motion bilaterally in the upper extremities. EXTREMITIES:  No peripheral edema.   SKIN:  Clear with no obvious rashes or skin changes. No nail dyscrasia. NEURO:  Nonfocal. Well oriented.  Appropriate affect.   LABORATORY DATA:  I have reviewed the data as listed CMP Latest Ref Rng & Units 09/26/2018 09/18/2018 09/12/2018  Glucose 70 - 99 mg/dL 86 97 85  BUN 8 - 23 mg/dL '20 17 21  ' Creatinine 0.44 - 1.00 mg/dL 1.55(H) 1.62(H) 1.49(H)  Sodium 135 - 145 mmol/L 140 141 140  Potassium 3.5 - 5.1 mmol/L 3.7 4.1 3.9  Chloride 98 - 111 mmol/L 103 100 103   CO2 22 - 32 mmol/L 27 34(H) 29  Calcium 8.9 - 10.3 mg/dL 10.5(H) 10.5(H) 10.1  Total Protein 6.5 - 8.1 g/dL 8.7(H) 8.3(H) 8.3(H)  Total Bilirubin 0.3 - 1.2 mg/dL 0.4 0.5 0.4  Alkaline Phos 38 - 126 U/L 63 - 68  AST 15 - 41 U/L '31 27 28  ' ALT 0 - 44 U/L '16 14 14    ' Lab Results  Component Value Date   WBC 4.9 09/26/2018   HGB 14.1 09/26/2018   HCT 42.4 09/26/2018   MCV 96.8 09/26/2018   PLT 193 09/26/2018   NEUTROABS 2.3 09/26/2018    ASSESSMENT & PLAN:  Malignant neoplasm of upper-outer quadrant of right breast in female, estrogen receptor negative ([place]) Yoselin is doing well today.  She is tolerating treatment well.  She has no clinical signs of progression of disease.  She will proceed with Atezolizumab today and receive it every 2 weeks.  I will discuss with Dr. Jana Hakim when he wants her to be scanned again, but likely will be due in 10/2018.  I will call her with those recommendations when he returns on Monday.    We will see her back in 4 weeks.     The patient has a good understanding of the overall plan. she agrees with it. she will call with any problems that may develop before the next visit here.  A total of (20) minutes of face-to-face time was spent with this patient with greater than 50% of that time in counseling and care-coordination.  Wilber Bihari, NP

## 2018-09-26 ENCOUNTER — Inpatient Hospital Stay: Payer: Medicare HMO

## 2018-09-26 ENCOUNTER — Inpatient Hospital Stay: Payer: Medicare HMO | Attending: Adult Health

## 2018-09-26 ENCOUNTER — Other Ambulatory Visit: Payer: Self-pay

## 2018-09-26 ENCOUNTER — Inpatient Hospital Stay (HOSPITAL_BASED_OUTPATIENT_CLINIC_OR_DEPARTMENT_OTHER): Payer: Medicare HMO | Admitting: Adult Health

## 2018-09-26 ENCOUNTER — Encounter: Payer: Self-pay | Admitting: Adult Health

## 2018-09-26 VITALS — BP 139/99 | HR 81 | Temp 98.4°F | Resp 18 | Ht 67.0 in | Wt 262.1 lb

## 2018-09-26 DIAGNOSIS — Z95828 Presence of other vascular implants and grafts: Secondary | ICD-10-CM

## 2018-09-26 DIAGNOSIS — Z171 Estrogen receptor negative status [ER-]: Secondary | ICD-10-CM

## 2018-09-26 DIAGNOSIS — C50411 Malignant neoplasm of upper-outer quadrant of right female breast: Secondary | ICD-10-CM

## 2018-09-26 DIAGNOSIS — Z79899 Other long term (current) drug therapy: Secondary | ICD-10-CM

## 2018-09-26 DIAGNOSIS — Z5112 Encounter for antineoplastic immunotherapy: Secondary | ICD-10-CM | POA: Diagnosis not present

## 2018-09-26 DIAGNOSIS — C50911 Malignant neoplasm of unspecified site of right female breast: Secondary | ICD-10-CM

## 2018-09-26 LAB — CBC WITH DIFFERENTIAL (CANCER CENTER ONLY)
Abs Immature Granulocytes: 0 10*3/uL (ref 0.00–0.07)
Basophils Absolute: 0.1 10*3/uL (ref 0.0–0.1)
Basophils Relative: 1 %
Eosinophils Absolute: 0.2 10*3/uL (ref 0.0–0.5)
Eosinophils Relative: 3 %
HCT: 42.4 % (ref 36.0–46.0)
Hemoglobin: 14.1 g/dL (ref 12.0–15.0)
Immature Granulocytes: 0 %
Lymphocytes Relative: 34 %
Lymphs Abs: 1.7 10*3/uL (ref 0.7–4.0)
MCH: 32.2 pg (ref 26.0–34.0)
MCHC: 33.3 g/dL (ref 30.0–36.0)
MCV: 96.8 fL (ref 80.0–100.0)
Monocytes Absolute: 0.7 10*3/uL (ref 0.1–1.0)
Monocytes Relative: 15 %
Neutro Abs: 2.3 10*3/uL (ref 1.7–7.7)
Neutrophils Relative %: 47 %
Platelet Count: 193 10*3/uL (ref 150–400)
RBC: 4.38 MIL/uL (ref 3.87–5.11)
RDW: 13 % (ref 11.5–15.5)
WBC Count: 4.9 10*3/uL (ref 4.0–10.5)
nRBC: 0 % (ref 0.0–0.2)

## 2018-09-26 LAB — TSH: TSH: 4.725 u[IU]/mL — ABNORMAL HIGH (ref 0.308–3.960)

## 2018-09-26 LAB — CMP (CANCER CENTER ONLY)
ALT: 16 U/L (ref 0–44)
AST: 31 U/L (ref 15–41)
Albumin: 3.7 g/dL (ref 3.5–5.0)
Alkaline Phosphatase: 63 U/L (ref 38–126)
Anion gap: 10 (ref 5–15)
BUN: 20 mg/dL (ref 8–23)
CO2: 27 mmol/L (ref 22–32)
Calcium: 10.5 mg/dL — ABNORMAL HIGH (ref 8.9–10.3)
Chloride: 103 mmol/L (ref 98–111)
Creatinine: 1.55 mg/dL — ABNORMAL HIGH (ref 0.44–1.00)
GFR, Est AFR Am: 41 mL/min — ABNORMAL LOW (ref >60)
GFR, Estimated: 36 mL/min — ABNORMAL LOW (ref >60)
Glucose, Bld: 86 mg/dL (ref 70–99)
Potassium: 3.7 mmol/L (ref 3.5–5.1)
Sodium: 140 mmol/L (ref 135–145)
Total Bilirubin: 0.4 mg/dL (ref 0.3–1.2)
Total Protein: 8.7 g/dL — ABNORMAL HIGH (ref 6.5–8.1)

## 2018-09-26 MED ORDER — PROCHLORPERAZINE MALEATE 10 MG PO TABS
ORAL_TABLET | ORAL | Status: AC
  Filled 2018-09-26: qty 1

## 2018-09-26 MED ORDER — SODIUM CHLORIDE 0.9 % IV SOLN
Freq: Once | INTRAVENOUS | Status: AC
  Administered 2018-09-26: 14:00:00 via INTRAVENOUS
  Filled 2018-09-26: qty 250

## 2018-09-26 MED ORDER — HEPARIN SOD (PORK) LOCK FLUSH 100 UNIT/ML IV SOLN
500.0000 [IU] | Freq: Once | INTRAVENOUS | Status: AC | PRN
  Administered 2018-09-26: 500 [IU]
  Filled 2018-09-26: qty 5

## 2018-09-26 MED ORDER — PROCHLORPERAZINE MALEATE 10 MG PO TABS
10.0000 mg | ORAL_TABLET | Freq: Once | ORAL | Status: AC
  Administered 2018-09-26: 10 mg via ORAL

## 2018-09-26 MED ORDER — SODIUM CHLORIDE 0.9% FLUSH
10.0000 mL | Freq: Once | INTRAVENOUS | Status: AC
  Administered 2018-09-26: 13:00:00 10 mL
  Filled 2018-09-26: qty 10

## 2018-09-26 MED ORDER — SODIUM CHLORIDE 0.9% FLUSH
10.0000 mL | INTRAVENOUS | Status: DC | PRN
  Administered 2018-09-26: 10 mL
  Filled 2018-09-26: qty 10

## 2018-09-26 MED ORDER — SODIUM CHLORIDE 0.9 % IV SOLN
840.0000 mg | Freq: Once | INTRAVENOUS | Status: AC
  Administered 2018-09-26: 840 mg via INTRAVENOUS
  Filled 2018-09-26: qty 14

## 2018-09-26 NOTE — Assessment & Plan Note (Signed)
Kayla Price is doing well today.  She is tolerating treatment well.  She has no clinical signs of progression of disease.  She will proceed with Atezolizumab today and receive it every 2 weeks.  I will discuss with Dr. Jana Hakim when he wants her to be scanned again, but likely will be due in 10/2018.  I will call her with those recommendations when he returns on Monday.    We will see her back in 4 weeks.

## 2018-09-26 NOTE — Patient Instructions (Signed)
Coronavirus (COVID-19) Are you at risk?  Are you at risk for the Coronavirus (COVID-19)?  To be considered HIGH RISK for Coronavirus (COVID-19), you have to meet the following criteria:  . Traveled to [place], [place], [place], [place] or [place]; or in the [place] to [place], [place], [place], or [place]; and have fever, cough, and shortness of breath within the last 2 weeks of travel OR . Been in close contact with a person diagnosed with COVID-19 within the last 2 weeks and have fever, cough, and shortness of breath . IF YOU DO NOT MEET THESE CRITERIA, YOU ARE CONSIDERED LOW RISK FOR COVID-19.  What to do if you are HIGH RISK for COVID-19?  . If you are having a medical emergency, call 911. . Seek medical care right away. Before you go to a doctor's office, urgent care or emergency department, call ahead and tell them about your recent travel, contact with someone diagnosed with COVID-19, and your symptoms. You should receive instructions from your physician's office regarding next steps of care.  . When you arrive at healthcare provider, tell the healthcare staff immediately you have returned from visiting [place], [place], [place], [place] or [place]; or traveled in the [place] to [place], [place], [place], or [place]; in the last two weeks or you have been in close contact with a person diagnosed with COVID-19 in the last 2 weeks.   . Tell the health care staff about your symptoms: fever, cough and shortness of breath. . After you have been seen by a medical provider, you will be either: o Tested for (COVID-19) and discharged home on quarantine except to seek medical care if symptoms worsen, and asked to  - Stay home and avoid contact with others until you get your results (4-5 days)  - Avoid travel on public transportation if possible (such as bus, train, or airplane) or o Sent to the Emergency Department by EMS for evaluation, COVID-19 testing, and possible  admission depending on your condition and test results.  What to do if you are LOW RISK for COVID-19?  Reduce your risk of any infection by using the same precautions used for avoiding the common cold or flu:  . Wash your hands often with soap and warm water for at least 20 seconds.  If soap and water are not readily available, use an alcohol-based hand sanitizer with at least 60% alcohol.  . If coughing or sneezing, cover your mouth and nose by coughing or sneezing into the elbow areas of your shirt or coat, into a tissue or into your sleeve (not your hands). . Avoid shaking hands with others and consider head nods or verbal greetings only. . Avoid touching your eyes, nose, or mouth with unwashed hands.  . Avoid close contact with people who are sick. . Avoid places or events with large numbers of people in one location, like concerts or sporting events. . Carefully consider travel plans you have or are making. . If you are planning any travel outside or inside the [place], visit the CDC's Travelers' Health webpage for the latest health notices. . If you have some symptoms but not all symptoms, continue to monitor at home and seek medical attention if your symptoms worsen. . If you are having a medical emergency, call 911.   ADDITIONAL HEALTHCARE OPTIONS FOR PATIENTS  [facility] / e-Visit: [email or web address]         [facility]: [phone]  [place]   Urgent Care: [place] [facility]: [place] Discharge Instructions for Patients Receiving Chemotherapy  Today you received the following chemotherapy agents: Atezolizumab Gildardo Pounds)  To help prevent nausea and vomiting after your treatment, we encourage you to take your nausea medication as directed.    If you develop nausea and vomiting that is not controlled by your nausea medication, call the  clinic.   BELOW ARE SYMPTOMS THAT SHOULD BE REPORTED IMMEDIATELY:  *FEVER GREATER THAN 100.5 F  *CHILLS WITH OR WITHOUT FEVER  NAUSEA AND VOMITING THAT IS NOT CONTROLLED WITH YOUR NAUSEA MEDICATION  *UNUSUAL SHORTNESS OF BREATH  *UNUSUAL BRUISING OR BLEEDING  TENDERNESS IN MOUTH AND THROAT WITH OR WITHOUT PRESENCE OF ULCERS  *URINARY PROBLEMS  *BOWEL PROBLEMS  UNUSUAL RASH Items with * indicate a potential emergency and should be followed up as soon as possible.  Feel free to call the clinic should you have any questions or concerns. The clinic phone number is [phone].  Please show the [facility] at check-in to the Emergency Department and triage nurse.

## 2018-09-27 LAB — CANCER ANTIGEN 27.29: CA 27.29: 32 U/mL (ref 0.0–38.6)

## 2018-09-27 LAB — T4: T4, Total: 11.8 ug/dL (ref 4.5–12.0)

## 2018-09-29 ENCOUNTER — Telehealth: Payer: Self-pay | Admitting: Adult Health

## 2018-09-29 NOTE — Telephone Encounter (Signed)
Called regarding 5/1

## 2018-10-03 ENCOUNTER — Encounter: Payer: Medicare HMO | Admitting: Family

## 2018-10-06 ENCOUNTER — Other Ambulatory Visit: Payer: Self-pay | Admitting: Nurse Practitioner

## 2018-10-06 DIAGNOSIS — M1990 Unspecified osteoarthritis, unspecified site: Secondary | ICD-10-CM

## 2018-10-06 MED FILL — SELZENTRY 150 MG TABS: 150 | 30 days supply | Qty: 60 | Fill #3

## 2018-10-06 MED FILL — OLMESARTAN-HCTZ 40-25 MG TA: 40-25 | 30 days supply | Qty: 30 | Fill #1

## 2018-10-06 NOTE — Telephone Encounter (Signed)
Last filled 09/01/2018  [facility] Database verified and compliance confirmed

## 2018-10-07 MED FILL — HYDROCODON-APAP 5-325: 5-325 | 15 days supply | Qty: 60 | Fill #0

## 2018-10-08 ENCOUNTER — Encounter: Payer: Self-pay | Admitting: Endocrinology

## 2018-10-09 ENCOUNTER — Ambulatory Visit (INDEPENDENT_AMBULATORY_CARE_PROVIDER_SITE_OTHER): Payer: Medicare HMO | Admitting: Infectious Disease

## 2018-10-09 ENCOUNTER — Ambulatory Visit (INDEPENDENT_AMBULATORY_CARE_PROVIDER_SITE_OTHER): Payer: Medicare HMO | Admitting: Endocrinology

## 2018-10-09 ENCOUNTER — Other Ambulatory Visit: Payer: Self-pay

## 2018-10-09 ENCOUNTER — Encounter: Payer: Self-pay | Admitting: Infectious Disease

## 2018-10-09 DIAGNOSIS — B20 Human immunodeficiency virus [HIV] disease: Secondary | ICD-10-CM | POA: Diagnosis not present

## 2018-10-09 DIAGNOSIS — Z79899 Other long term (current) drug therapy: Secondary | ICD-10-CM

## 2018-10-09 DIAGNOSIS — Z853 Personal history of malignant neoplasm of breast: Secondary | ICD-10-CM

## 2018-10-09 DIAGNOSIS — C50919 Malignant neoplasm of unspecified site of unspecified female breast: Secondary | ICD-10-CM

## 2018-10-09 DIAGNOSIS — N289 Disorder of kidney and ureter, unspecified: Secondary | ICD-10-CM

## 2018-10-09 DIAGNOSIS — N183 Chronic kidney disease, stage 3 unspecified: Secondary | ICD-10-CM

## 2018-10-09 DIAGNOSIS — E890 Postprocedural hypothyroidism: Secondary | ICD-10-CM | POA: Diagnosis not present

## 2018-10-09 NOTE — Patient Instructions (Signed)
Blood tests are requested for you today.  Please do in approx 2 weeks.  We'll let you know about the results.  It is best to never miss the medication.  However, if you do miss it, next best is to double up the next time.   Please come back for a follow-up appointment in 3 months.

## 2018-10-09 NOTE — Progress Notes (Signed)
Patient ID: RAELEE Price, female   DOB: 1956/06/12, 63 y.o.   MRN: [id number] Virtual Visit via Telephone Note  I connected with Kayla Price on 10/09/18 at  9:30 AM EDT by telephone and verified that I am speaking with the correct person using two identifiers.   I discussed the limitations, risks, security and privacy concerns of performing an evaluation and management service by telephone and the availability of in person appointments. I also discussed with the patient that there may be a patient responsible charge related to this service. The patient expressed understanding and agreed to proceed.   History of Present Illness:  Kayla Price is doing quite well.  She is highly adherent to her antiretroviral regimen JULUCA and Prezcobix and maintaining a viral load that is less than 20 and a healthy CD4 count.  Continues undergo chemotherapy for her breast cancer and having infusions at the cancer center.  She had a virtual visit this morning with Dr. Loanne Drilling with endocrinology with regards to her thyroid.  She is in good spirits.'s and other than visiting the cancer center for her infusions she is sheltering in place and having food delivered to her.  View of systems as noted above otherwise 12 point review of systems is negative  Past Medical History:  Diagnosis Date  . Alopecia areata 11/28/2009  . Bell's palsy   . Cancer [facility]) 2005   Breast cancer   chemotherapy and radiation  . CKD (chronic kidney disease) stage 3, GFR 30-59 ml/min (HCC) 06/08/2015  . Dry eye syndrome   . Family history of lung cancer   . Family history of non-Hodgkin's lymphoma   . Fasting hyperglycemia   . Gestational diabetes    2001  . HIP FRACTURE, RIGHT 05/06/2008  . History of kidney stones   . HIV DISEASE 03/27/2006  . HYPERLIPIDEMIA, MIXED 12/15/2007  . HYPERTENSION 03/27/2006  . 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 MENORRHAGIA, POSTMENOPAUSAL 02/03/2009  . Osteoarthritis of left knee  06/08/2015  . OSTEOARTHROSIS, LOCAL, SCND, UNSPC SITE 04/07/2007  . PVD 04/07/2007  . Tinea capitis   . TRIGGER FINGER 05/06/2008  . Unspecified vitamin D deficiency 08/06/2007    Past Surgical History:  Procedure Laterality Date  . BREAST LUMPECTOMY Right    2005  . CHEST TUBE INSERTION Right 05/30/2018   Procedure: INSERTION PLEURAL DRAINAGE CATHETER;  Surgeon: Grace Isaac, MD;  Location: [place];  Service: Thoracic;  Laterality: Right;  . COLONOSCOPY    . COLONOSCOPY WITH ESOPHAGOGASTRODUODENOSCOPY (EGD)    . ENDOBRONCHIAL ULTRASOUND Bilateral 05/15/2018   Procedure: ENDOBRONCHIAL ULTRASOUND;  Surgeon: Collene Gobble, MD;  Location: [facility];  Service: Cardiopulmonary;  Laterality: Bilateral;  . FINE NEEDLE ASPIRATION BIOPSY  05/15/2018   Procedure: FINE NEEDLE ASPIRATION BIOPSY;  Surgeon: Collene Gobble, MD;  Location: [facility];  Service: Cardiopulmonary;;  . FLEXIBLE BRONCHOSCOPY  05/15/2018   Procedure: FLEXIBLE BRONCHOSCOPY;  Surgeon: Collene Gobble, MD;  Location: [facility];  Service: Cardiopulmonary;;  . HYSTEROSCOPY  2006  . IR IMAGING GUIDED PORT INSERTION  07/14/2018  . IR THORACENTESIS ASP PLEURAL SPACE W/IMG GUIDE  04/14/2018  . IR THORACENTESIS ASP PLEURAL SPACE W/IMG GUIDE  04/28/2018  . KNEE ARTHROSCOPY Left 2002  . LYMPH NODE DISSECTION  2005  . MASTECTOMY MODIFIED RADICAL Right 11/05/2016   Procedure: RIGHT MASTECTOMY MODIFIED RADICAL;  Surgeon: Fanny Skates, MD;  Location: [place];  Service: General;  Laterality: Right;  . MODIFIED RADICAL MASTECTOMY Right 11/05/2016  . placement  of port-a-cath    . PLEURAL BIOPSY Right 05/30/2018   Procedure: PLEURAL BIOPSY;  Surgeon: Grace Isaac, MD;  Location: [place];  Service: Thoracic;  Laterality: Right;  . PLEURAL EFFUSION DRAINAGE Right 05/30/2018   Procedure: DRAINAGE OF PLEURAL EFFUSION;  Surgeon: Grace Isaac, MD;  Location: [facility];  Service: Thoracic;  Laterality: Right;  . PORT-A-CATH  REMOVAL  2006   insertion 2005  . PORT-A-CATH REMOVAL N/A 03/29/2017   Procedure: REMOVAL PORT-A-CATH;  Surgeon: Fanny Skates, MD;  Location: [place];  Service: General;  Laterality: N/A;  . PORTACATH PLACEMENT Right 11/05/2016   Procedure: INSERTION PORT-A-CATH WITH ULTRA SOUND;  Surgeon: Fanny Skates, MD;  Location: [place];  Service: General;  Laterality: Right;  . removal of port a cath  12/2014  . TALC PLEURODESIS Right 05/30/2018   Procedure: Pietro Cassis;  Surgeon: Grace Isaac, MD;  Location: [place];  Service: Thoracic;  Laterality: Right;  . THYROID SURGERY  2009   Ablation   . TOTAL KNEE ARTHROPLASTY Left 02/06/2016   Procedure: TOTAL KNEE ARTHROPLASTY;  Surgeon: Frederik Pear, MD;  Location: [facility];  Service: Orthopedics;  Laterality: Left;  . TUBAL LIGATION    . VIDEO ASSISTED THORACOSCOPY Right 05/30/2018   Procedure: VIDEO ASSISTED THORACOSCOPY;  Surgeon: Grace Isaac, MD;  Location: [facility];  Service: Thoracic;  Laterality: Right;    Family History  Problem Relation Age of Onset  . Heart attack Brother        Massive MI in 62s  . Stroke Brother        CAD  . Kidney disease Mother   . Stroke Mother   . Diabetes Mother   . Liver disease Sister   . COPD Sister        had I-131 rx of hyperthyroidism  . Diabetes Sister   . Stroke Sister   . Lung cancer Paternal Uncle        hx smoking  . Cancer Cousin   . Non-Hodgkin's lymphoma Cousin 25       cancer x3, in prostate and lung- unsure if met/spread or if primaries  . Heart failure Father   . Heart disease Father   . Arthritis Father   . Sarcoidosis Sister       Social History   Socioeconomic History  . Marital status: Widowed    Spouse name: Not on file  . Number of children: Not on file  . Years of education: Not on file  . Highest education level: Not on file  Occupational History  . Occupation: Paediatric nurse: UNEMPLOYED  Social Needs  . Financial resource strain: Not hard at all   . Food insecurity:    Worry: Never true    Inability: Never true  . Transportation needs:    Medical: No    Non-medical: No  Tobacco Use  . Smoking status: Never Smoker  . Smokeless tobacco: Never Used  Substance and Sexual Activity  . Alcohol use: No  . Drug use: No  . Sexual activity: Not Currently    Birth control/protection: Post-menopausal    Comment: declined condoms  Lifestyle  . Physical activity:    Days per week: 3 days    Minutes per session: 120 min  . Stress: Not at all  Relationships  . Social connections:    Talks on phone: More than three times a week    Gets together: More than three times a week  Attends religious service: More than 4 times per year    Active member of club or organization: No    Attends meetings of clubs or organizations: Never    Relationship status: Widowed  Other Topics Concern  . Not on file  Social History Narrative   Single/widow   Diet: good   Do you drink/eat things with caffeine? Tea occasionally   Marital status: Widowed  What year were you married? 1993   Do you live in a house, apartment,assisted living, condo,trailer,ect.)? House   Is it one or more stories? Two   How many persons live in your home? 4   Do you have any pets in you home? No   Current or past profession: Tour manager   Do you exercise? Yes  Type&how often: Stationary Bike, Water Exercise 3-4 x week   Do you have a living will? Yes   Do you have a DNR form? No  If not do you what one?   Do you have signed POA/HPOA forms? No   If so, please bring to your appointment.                Allergies  Allergen Reactions  . Lisinopril Anaphylaxis and Swelling    Swelling of tongue and mouth 11/05/16- tolerates Olmesartan  . Pepcid [Famotidine] Other (See Comments)    PPI H2, BLOCKERS LOWER GASTRIC PH WHICH WOULD LEAD TO SUBTHERAPEUTIC RILPIVIRINE LEVELS AND POTENTIAL VIROLOGICAL FAILURE WITH RESISTANCE  . Prilosec [Omeprazole] Other (See Comments)    PPI  H2, BLOCKERS LOWER GASTRIC PH WHICH WOULD LEAD TO SUBTHERAPEUTIC RILPIVIRINE LEVELS AND POTENTIAL VIROLOGICAL FAILURE WITH RESISTANCE  . Tums [Calcium Carbonate Antacid] Other (See Comments)    TUMS ANTACIDS CAN LOWER GASTRIC PH WHICH COULD  LEAD TO SUBTHERAPEUTIC RILPIVIRINE LEVELS AND POTENTIAL VIROLOGICAL FAILURE WITH RESISTANCE TUMS CAN BE GIVEN BUT NEED CONSULT WITH [facility] PHARM[facility] RE TIMING. I PREFER HER TO AVOID ALL TOGETHER     Current Outpatient Medications:  .  darunavir-cobicistat (PREZCOBIX) 800-150 MG tablet, Take 1 tablet by mouth at bedtime. SWALLOW WHOLE. DO NOT CRUSH, BREAK OR CHEW TABLETS. TAKE WITH FOOD., Disp: , Rfl:  .  Dolutegravir-Rilpivirine (JULUCA) 50-25 MG TABS, Take 1 tablet by mouth at bedtime. Take with the Prezcobix., Disp: , Rfl:  .  HYDROcodone-acetaminophen (NORCO/VICODIN) 5-325 MG tablet, TAKE ONE TABLET BY MOUTH EVERY 6 HOURS AS NEEDED FOR MODERATE PAIN, Disp: 60 tablet, Rfl: 0 .  hydrocortisone cream 1 %, Apply 1 application topically daily as needed for itching., Disp: , Rfl:  .  levothyroxine (SYNTHROID, LEVOTHROID) 175 MCG tablet, Take 1 tablet (175 mcg total) by mouth daily before breakfast., Disp: 90 tablet, Rfl: 1 .  lidocaine-prilocaine (EMLA) cream, Apply 1 application topically as needed. Apply to port site 1 hour prior to access, Disp: 30 g, Rfl: 0 .  loratadine (CLARITIN) 10 MG tablet, Take 1 tablet (10 mg total) by mouth daily., Disp: 60 tablet, Rfl: 4 .  Menthol, Topical Analgesic, (BENGAY EX), Apply 1 application topically daily as needed (muscle pain)., Disp: , Rfl:  .  Naphazoline-Glycerin (REDNESS RELIEF OP), Place 1 drop into both eyes daily as needed (redness)., Disp: , Rfl:  .  olmesartan-hydrochlorothiazide (BENICAR HCT) 40-25 MG tablet, TAKE 1 TABLET BY MOUTH DAILY., Disp: 30 tablet, Rfl: 1 .  Pitavastatin Calcium 4 MG TABS, Take 1 tablet (4 mg total) by mouth daily at 12 noon. This may be placebo, study provided. Do not dispense., Disp: , Rfl:   .  SELZENTRY 150 MG tablet, TAKE ONE TABLET BY MOUTH TWICE DAILY, Disp: 60 tablet, Rfl: 5 .  Study - REPRIEVE [phone] - pitavastatin 4 mg or placebo tablet, Take 1 tablet (4 mg total) by mouth daily., Disp: , Rfl:  .  tobramycin-dexamethasone (TOBRADEX) ophthalmic solution, Place 2 drops into the right eye every 4 (four) hours while awake., Disp: 5 mL, Rfl: 0    Observations/Objective:  Kaleigha is as usual highly adherent to her antiretroviral regimen and maintaining a perfect viral logical suppression.  She is also going to her chemotherapy appointments.  She is doing her utmost to shelter in place and only making necessary visits such as to go to the cancer center to get infusion.    Assessment and Plan:  HIV disease: We will have her return to clinic in roughly 6 months time.  I have scheduled her appointment with me I have not schedule a lab visit we can consider scheduling a lab visit separately or doing the visits in 1 visit depending on the nature of how the pandemic is at that point in time.  Breast cancer continuing on with chemotherapy.  Kidney disease: Creatinine has been relatively stable recently.  Coronavirus 2019: We reviewed means of trying to avoid all unnecessary travel and contact.  Follow Up Instructions:    I discussed the assessment and treatment plan with the patient. The patient was provided an opportunity to ask questions and all were answered. The patient agreed with the plan and demonstrated an understanding of the instructions.   The patient was advised to call back or seek an in-person evaluation if the symptoms worsen or if the condition fails to improve as anticipated.  I provided 22 minutes of non-face-to-face time during this encounter.   Alcide Evener, MD

## 2018-10-09 NOTE — Progress Notes (Addendum)
Subjective:    Patient ID: Kayla Price, female    DOB: 08/22/1955, 63 y.o.   MRN: [id number]  HPI  telehealth visit today via doxy video visit.  Alternatives to telehealth are presented to this patient, and the patient agrees to the telehealth visit. Pt is advised of the cost of the visit, and agrees to this, also.   Patient is at home, and I am at the office.   Pt returns for f/u of post-RAI hypothyroidism (she had RAI for hyperthyroidism due to [place] in 2009, and now takes synthroid).   She says she never misses synthroid.  pt states she feels well in general.   Past Medical History:  Diagnosis Date  . Alopecia areata 11/28/2009  . Bell's palsy   . Cancer [facility]) 2005   Breast cancer   chemotherapy and radiation  . CKD (chronic kidney disease) stage 3, GFR 30-59 ml/min (HCC) 06/08/2015  . Dry eye syndrome   . Family history of lung cancer   . Family history of non-Hodgkin's lymphoma   . Fasting hyperglycemia   . Gestational diabetes    2001  . HIP FRACTURE, RIGHT 05/06/2008  . History of kidney stones   . HIV DISEASE 03/27/2006  . HYPERLIPIDEMIA, MIXED 12/15/2007  . HYPERTENSION 03/27/2006  . 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 MENORRHAGIA, POSTMENOPAUSAL 02/03/2009  . Osteoarthritis of left knee 06/08/2015  . OSTEOARTHROSIS, LOCAL, SCND, UNSPC SITE 04/07/2007  . PVD 04/07/2007  . Tinea capitis   . TRIGGER FINGER 05/06/2008  . Unspecified vitamin D deficiency 08/06/2007    Past Surgical History:  Procedure Laterality Date  . BREAST LUMPECTOMY Right    2005  . CHEST TUBE INSERTION Right 05/30/2018   Procedure: INSERTION PLEURAL DRAINAGE CATHETER;  Surgeon: Grace Isaac, MD;  Location: [place];  Service: Thoracic;  Laterality: Right;  . COLONOSCOPY    . COLONOSCOPY WITH ESOPHAGOGASTRODUODENOSCOPY (EGD)    . ENDOBRONCHIAL ULTRASOUND Bilateral 05/15/2018   Procedure: ENDOBRONCHIAL ULTRASOUND;  Surgeon: Collene Gobble, MD;  [facility];  Service:  Cardiopulmonary;  Laterality: Bilateral;  . FINE NEEDLE ASPIRATION BIOPSY  05/15/2018   Procedure: FINE NEEDLE ASPIRATION BIOPSY;  Surgeon: Collene Gobble, MD;  Location: [facility];  Service: Cardiopulmonary;;  . FLEXIBLE BRONCHOSCOPY  05/15/2018   Procedure: FLEXIBLE BRONCHOSCOPY;  Surgeon: Collene Gobble, MD;  [facility];  Service: Cardiopulmonary;;  . HYSTEROSCOPY  2006  . IR IMAGING GUIDED PORT INSERTION  07/14/2018  . IR THORACENTESIS ASP PLEURAL SPACE W/IMG GUIDE  04/14/2018  . IR THORACENTESIS ASP PLEURAL SPACE W/IMG GUIDE  04/28/2018  . KNEE ARTHROSCOPY Left 2002  . LYMPH NODE DISSECTION  2005  . MASTECTOMY MODIFIED RADICAL Right 11/05/2016   Procedure: RIGHT MASTECTOMY MODIFIED RADICAL;  Surgeon: Fanny Skates, MD;  Location: [place];  Service: General;  Laterality: Right;  . MODIFIED RADICAL MASTECTOMY Right 11/05/2016  . placement   of port-a-cath    . PLEURAL BIOPSY Right 05/30/2018   Procedure: PLEURAL BIOPSY;  Surgeon: Grace Isaac, MD;  Location: [place];  Service: Thoracic;  Laterality: Right;  . PLEURAL EFFUSION DRAINAGE Right 05/30/2018   Procedure: DRAINAGE OF PLEURAL EFFUSION;  Surgeon: Grace Isaac, MD;  Location: [place];  Service: Thoracic;  Laterality: Right;  . PORT-A-CATH REMOVAL  2006   insertion 2005  . PORT-A-CATH REMOVAL N/A 03/29/2017   Procedure: REMOVAL PORT-A-CATH;  Surgeon: Fanny Skates, MD;  Location: [place];  Service: General;  Laterality: N/A;  . PORTACATH  PLACEMENT Right 11/05/2016   Procedure: INSERTION PORT-A-CATH WITH ULTRA SOUND;  Surgeon: Fanny Skates, MD;  Location: [place];  Service: General;  Laterality: Right;  . removal of port a cath  12/2014  . TALC PLEURODESIS Right 05/30/2018   Procedure: Pietro Cassis;  Surgeon: Grace Isaac, MD;  Location: [place];  Service: Thoracic;  Laterality: Right;  . THYROID SURGERY  2009   Ablation   . TOTAL KNEE ARTHROPLASTY Left 02/06/2016   Procedure: TOTAL KNEE ARTHROPLASTY;   Surgeon: Frederik Pear, MD;  Location: [place];  Service: Orthopedics;  Laterality: Left;  . TUBAL LIGATION    . VIDEO ASSISTED THORACOSCOPY Right 05/30/2018   Procedure: VIDEO ASSISTED THORACOSCOPY;  Surgeon: Grace Isaac, MD;  Location: [facility];  Service: Thoracic;  Laterality: Right;    Social History   Socioeconomic History  . Marital status: Widowed    Spouse name: Not on file  . Number of children: Not on file  . Years of education: Not on file  . Highest education level: Not on file  Occupational History  . Occupation: Paediatric nurse: UNEMPLOYED  Social Needs  . Financial resource strain: Not hard at all  . Food insecurity:    Worry: Never true    Inability: Never true  . Transportation needs:    Medical: No    Non-medical: No  Tobacco Use  . Smoking status: Never Smoker  . Smokeless tobacco: Never Used  Substance and Sexual Activity  . Alcohol use: No  . Drug use: No  . Sexual activity: Not Currently    Birth control/protection: Post-menopausal    Comment: declined condoms  Lifestyle  . Physical activity:    Days per week: 3 days    Minutes per session: 120 min  . Stress: Not at all  Relationships  . Social connections:    Talks on phone: More than three times a week    Gets together: More than three times a week    Attends religious service: More than 4 times per year    Active member of club or organization: No    Attends meetings of clubs or organizations: Never    Relationship status: Widowed  . Intimate partner violence:    Fear of current or ex partner: No    Emotionally abused: No    Physically abused: No    Forced sexual activity: No  Other Topics Concern  . Not on file  Social History Narrative   Single/widow   Diet: good   Do you drink/eat things with caffeine? Tea occasionally   Marital status: Widowed  What year were you married? 1993   Do you live in a house, apartment,assisted living, condo,trailer,ect.)? House   Is it one or  more stories? Two   How many persons live in your home? 4   Do you have any pets in you home? No   Current or past profession: Tour manager   Do you exercise? Yes  Type&how often: Stationary Bike, Water Exercise 3-4 x week   Do you have a living will? Yes   Do you have a DNR form? No  If not do you what one?   Do you have signed POA/HPOA forms? No   If so, please bring to your appointment.                Current Outpatient Medications on File Prior to Visit  Medication Sig Dispense Refill  . darunavir-cobicistat (PREZCOBIX) 800-150 MG  tablet Take 1 tablet by mouth at bedtime. SWALLOW WHOLE. DO NOT CRUSH, BREAK OR CHEW TABLETS. TAKE WITH FOOD.    . Dolutegravir-Rilpivirine (JULUCA) 50-25 MG TABS Take 1 tablet by mouth at bedtime. Take with the Prezcobix.    Marland Kitchen HYDROcodone-acetaminophen (NORCO/VICODIN) 5-325 MG tablet TAKE ONE TABLET BY MOUTH EVERY 6 HOURS AS NEEDED FOR MODERATE PAIN 60 tablet 0  . hydrocortisone cream 1 % Apply 1 application topically daily as needed for itching.    . levothyroxine (SYNTHROID, LEVOTHROID) 175 MCG tablet Take 1 tablet (175 mcg total) by mouth daily before breakfast. 90 tablet 1  . lidocaine-prilocaine (EMLA) cream Apply 1 application topically as needed. Apply to port site 1 hour prior to access 30 g 0  . loratadine (CLARITIN) 10 MG tablet Take 1 tablet (10 mg total) by mouth daily. 60 tablet 4  . Menthol, Topical Analgesic, (BENGAY EX) Apply 1 application topically daily as needed (muscle pain).    . Naphazoline-Glycerin (REDNESS RELIEF OP) Place 1 drop into both eyes daily as needed (redness).    Marland Kitchen olmesartan-hydrochlorothiazide (BENICAR HCT) 40-25 MG tablet TAKE 1 TABLET BY MOUTH DAILY. 30 tablet 1  . Pitavastatin Calcium 4 MG TABS Take 1 tablet (4 mg total) by mouth daily at 12 noon. This may be placebo, study provided. Do not dispense.    Marland Kitchen SELZENTRY 150 MG tablet TAKE ONE TABLET BY MOUTH TWICE DAILY 60 tablet 5  . Study - REPRIEVE [id number] - pitavastatin  4 mg or placebo tablet Take 1 tablet (4 mg total) by mouth daily.    Marland Kitchen tobramycin-dexamethasone (TOBRADEX) ophthalmic solution Place 2 drops into the right eye every 4 (four) hours while awake. 5 mL 0   No current facility-administered medications on file prior to visit.     Allergies  Allergen Reactions  . Lisinopril Anaphylaxis and Swelling    Swelling of tongue and mouth 11/05/16- tolerates Olmesartan  . Pepcid [Famotidine] Other (See Comments)    PPI H2, BLOCKERS LOWER GASTRIC PH WHICH WOULD LEAD TO SUBTHERAPEUTIC RILPIVIRINE LEVELS AND POTENTIAL VIROLOGICAL FAILURE WITH RESISTANCE  . Prilosec [Omeprazole] Other (See Comments)    PPI H2, BLOCKERS LOWER GASTRIC PH WHICH WOULD LEAD TO SUBTHERAPEUTIC RILPIVIRINE LEVELS AND POTENTIAL VIROLOGICAL FAILURE WITH RESISTANCE  . Tums [Calcium Carbonate Antacid] Other (See Comments)    TUMS ANTACIDS CAN LOWER GASTRIC PH WHICH COULD  LEAD TO SUBTHERAPEUTIC RILPIVIRINE LEVELS AND POTENTIAL VIROLOGICAL FAILURE WITH RESISTANCE TUMS CAN BE GIVEN BUT NEED CONSULT WITH ID PHARMACY RE TIMING. I PREFER HER TO AVOID ALL TOGETHER    Family History  Problem Relation Age of Onset  . Heart attack Brother        Massive MI in 37s  . Stroke Brother        CAD  . Kidney disease Mother   . Stroke Mother   . Diabetes Mother   . Liver disease Sister   . COPD Sister        had I-131 rx of hyperthyroidism  . Diabetes Sister   . Stroke Sister   . Lung cancer Paternal Uncle        hx smoking  . Cancer Cousin   . Non-Hodgkin's lymphoma Cousin 25       cancer x3, in prostate and lung- unsure if met/spread or if primaries  . Heart failure Father   . Heart disease Father   . Arthritis Father   . Sarcoidosis Sister     LMP 11/06/2003    Review of  Systems She has lost a few lbs.      Objective:   Physical Exam   Lab Results  Component Value Date   TSH 4.725 (H) 09/26/2018   T3TOTAL 271.2 (H) 12/15/2007   T4TOTAL 11.8 09/26/2018   Lab Results   Component Value Date   TSH 1.409 10/10/2018   T3TOTAL 271.2 (H) 12/15/2007   T4TOTAL 14.5 (H) 10/10/2018      Assessment & Plan:  Hypothyroidism: well-replaced Noncompliance with medication.  Much better.    Patient Instructions  Blood tests are requested for you today.  Please do in approx 2 weeks.  We'll let you know about the results.  It is best to never miss the medication.  However, if you do miss it, next best is to double up the next time.   Please come back for a follow-up appointment in 3 months.

## 2018-10-10 ENCOUNTER — Inpatient Hospital Stay: Payer: Medicare HMO

## 2018-10-10 ENCOUNTER — Other Ambulatory Visit: Payer: Self-pay

## 2018-10-10 ENCOUNTER — Ambulatory Visit: Payer: Medicare HMO | Admitting: Endocrinology

## 2018-10-10 VITALS — BP 123/86 | HR 84 | Temp 98.6°F | Resp 20 | Wt 264.0 lb

## 2018-10-10 DIAGNOSIS — C50411 Malignant neoplasm of upper-outer quadrant of right female breast: Secondary | ICD-10-CM

## 2018-10-10 DIAGNOSIS — Z171 Estrogen receptor negative status [ER-]: Secondary | ICD-10-CM

## 2018-10-10 DIAGNOSIS — C50911 Malignant neoplasm of unspecified site of right female breast: Secondary | ICD-10-CM

## 2018-10-10 DIAGNOSIS — Z95828 Presence of other vascular implants and grafts: Secondary | ICD-10-CM

## 2018-10-10 DIAGNOSIS — Z5112 Encounter for antineoplastic immunotherapy: Secondary | ICD-10-CM | POA: Diagnosis not present

## 2018-10-10 DIAGNOSIS — Z79899 Other long term (current) drug therapy: Secondary | ICD-10-CM | POA: Diagnosis not present

## 2018-10-10 LAB — CBC WITH DIFFERENTIAL (CANCER CENTER ONLY)
Abs Immature Granulocytes: 0.01 10*3/uL (ref 0.00–0.07)
Basophils Absolute: 0 10*3/uL (ref 0.0–0.1)
Basophils Relative: 1 %
Eosinophils Absolute: 0.2 10*3/uL (ref 0.0–0.5)
Eosinophils Relative: 3 %
HCT: 40.8 % (ref 36.0–46.0)
Hemoglobin: 13.2 g/dL (ref 12.0–15.0)
Immature Granulocytes: 0 %
Lymphocytes Relative: 32 %
Lymphs Abs: 1.4 10*3/uL (ref 0.7–4.0)
MCH: 31.7 pg (ref 26.0–34.0)
MCHC: 32.4 g/dL (ref 30.0–36.0)
MCV: 98.1 fL (ref 80.0–100.0)
Monocytes Absolute: 0.6 10*3/uL (ref 0.1–1.0)
Monocytes Relative: 14 %
Neutro Abs: 2.3 10*3/uL (ref 1.7–7.7)
Neutrophils Relative %: 50 %
Platelet Count: 198 10*3/uL (ref 150–400)
RBC: 4.16 MIL/uL (ref 3.87–5.11)
RDW: 13.2 % (ref 11.5–15.5)
WBC Count: 4.5 10*3/uL (ref 4.0–10.5)
nRBC: 0 % (ref 0.0–0.2)

## 2018-10-10 LAB — CMP (CANCER CENTER ONLY)
ALT: 14 U/L (ref 0–44)
AST: 35 U/L (ref 15–41)
Albumin: 3.5 g/dL (ref 3.5–5.0)
Alkaline Phosphatase: 76 U/L (ref 38–126)
Anion gap: 11 (ref 5–15)
BUN: 14 mg/dL (ref 8–23)
CO2: 26 mmol/L (ref 22–32)
Calcium: 10.1 mg/dL (ref 8.9–10.3)
Chloride: 106 mmol/L (ref 98–111)
Creatinine: 1.41 mg/dL — ABNORMAL HIGH (ref 0.44–1.00)
GFR, Est AFR Am: 46 mL/min — ABNORMAL LOW (ref >60)
GFR, Estimated: 40 mL/min — ABNORMAL LOW (ref >60)
Glucose, Bld: 90 mg/dL (ref 70–99)
Potassium: 3.5 mmol/L (ref 3.5–5.1)
Sodium: 143 mmol/L (ref 135–145)
Total Bilirubin: 0.4 mg/dL (ref 0.3–1.2)
Total Protein: 8.3 g/dL — ABNORMAL HIGH (ref 6.5–8.1)

## 2018-10-10 LAB — TSH: TSH: 1.409 u[IU]/mL (ref 0.308–3.960)

## 2018-10-10 MED ORDER — SODIUM CHLORIDE 0.9% FLUSH
10.0000 mL | Freq: Once | INTRAVENOUS | Status: AC
  Administered 2018-10-10: 10 mL
  Filled 2018-10-10: qty 10

## 2018-10-10 MED ORDER — PROCHLORPERAZINE MALEATE 10 MG PO TABS
10.0000 mg | ORAL_TABLET | Freq: Once | ORAL | Status: AC
  Administered 2018-10-10: 10 mg via ORAL

## 2018-10-10 MED ORDER — SODIUM CHLORIDE 0.9 % IV SOLN
Freq: Once | INTRAVENOUS | Status: AC
  Administered 2018-10-10: 13:00:00 via INTRAVENOUS
  Filled 2018-10-10: qty 250

## 2018-10-10 MED ORDER — PROCHLORPERAZINE MALEATE 10 MG PO TABS
ORAL_TABLET | ORAL | Status: AC
  Filled 2018-10-10: qty 1

## 2018-10-10 MED ORDER — SODIUM CHLORIDE 0.9 % IV SOLN
840.0000 mg | Freq: Once | INTRAVENOUS | Status: AC
  Administered 2018-10-10: 840 mg via INTRAVENOUS
  Filled 2018-10-10: qty 14

## 2018-10-10 MED ORDER — SODIUM CHLORIDE 0.9% FLUSH
10.0000 mL | INTRAVENOUS | Status: DC | PRN
  Administered 2018-10-10: 10 mL
  Filled 2018-10-10: qty 10

## 2018-10-10 MED ORDER — HEPARIN SOD (PORK) LOCK FLUSH 100 UNIT/ML IV SOLN
500.0000 [IU] | Freq: Once | INTRAVENOUS | Status: AC | PRN
  Administered 2018-10-10: 500 [IU]
  Filled 2018-10-10: qty 5

## 2018-10-10 NOTE — Patient Instructions (Signed)
[facility] Discharge Instructions for Patients Receiving Chemotherapy  Today you received the following chemotherapy agents Atezol[facility]).  To help prevent nausea and vomiting after your treatment, we encourage you to take your nausea medication as prescribed.   If you develop nausea and vomiting that is not controlled by your nausea medication, call the clinic.   BELOW ARE SYMPTOMS THAT SHOULD BE REPORTED IMMEDIATELY:  *FEVER GREATER THAN 100.5 F  *CHILLS WITH OR WITHOUT FEVER  NAUSEA AND VOMITING THAT IS NOT CONTROLLED WITH YOUR NAUSEA MEDICATION  *UNUSUAL SHORTNESS OF BREATH  *UNUSUAL BRUISING OR BLEEDING  TENDERNESS IN MOUTH AND THROAT WITH OR WITHOUT PRESENCE OF ULCERS  *URINARY PROBLEMS  *BOWEL PROBLEMS  UNUSUAL RASH Items with * indicate a potential emergency and should be followed up as soon as possible.  Feel free to call the clinic should you have any questions or concerns. The clinic phone number is [phone].  Please show the [facility] at check-in to the Emergency Department and triage nurse.  Coronavirus (COVID-19) Are you at risk?  Are you at risk for the Coronavirus (COVID-19)?  To be considered HIGH RISK for Coronavirus (COVID-19), you have to meet the following criteria:  . Traveled to [place], [place], [place], [place] or [place]; or in the [place] to [place], [place], [place], or [place]; and have fever, cough, and shortness of breath within the last 2 weeks of travel OR . Been in close contact with a person diagnosed with COVID-19 within the last 2 weeks and have fever, cough, and shortness of breath . IF YOU DO NOT MEET THESE CRITERIA, YOU ARE CONSIDERED LOW RISK FOR COVID-19.  What to do if you are HIGH RISK for COVID-19?  Marland Kitchen If you are having a medical emergency, call 911. . Seek medical care right away. Before you go to a doctor's office, urgent care or emergency department, call ahead and tell  them about your recent travel, contact with someone diagnosed with COVID-19, and your symptoms. You should receive instructions from your physician's office regarding next steps of care.  . When you arrive at healthcare provider, tell the healthcare staff immediately you have returned from visiting [place], [place], [place], [place] or [place]; or traveled in the [place] to [place], [place], [place], or [place]; in the last two weeks or you have been in close contact with a person diagnosed with COVID-19 in the last 2 weeks.   . Tell the health care staff about your symptoms: fever, cough and shortness of breath. . After you have been seen by a medical provider, you will be either: o Tested for (COVID-19) and discharged home on quarantine except to seek medical care if symptoms worsen, and asked to  - Stay home and avoid contact with others until you get your results (4-5 days)  - Avoid travel on public transportation if possible (such as bus, train, or airplane) or o Sent to the Emergency Department by EMS for evaluation, COVID-19 testing, and possible admission depending on your condition and test results.  What to do if you are LOW RISK for COVID-19?  Reduce your risk of any infection by using the same precautions used for avoiding the common cold or flu:  Marland Kitchen Wash your hands often with soap and warm water for at least 20 seconds.  If soap and water are not readily available, use an alcohol-based hand sanitizer with at least 60% alcohol.  . If coughing or  sneezing, cover your mouth and nose by coughing or sneezing into the elbow areas of your shirt or coat, into a tissue or into your sleeve (not your hands). . Avoid shaking hands with others and consider head nods or verbal greetings only. . Avoid touching your eyes, nose, or mouth with unwashed hands.  . Avoid close contact with people who are sick. . Avoid places or events with large numbers of people in one location, like concerts or  sporting events. . Carefully consider travel plans you have or are making. . If you are planning any travel outside or inside the [place], visit the CDC's Travelers' Health webpage for the latest health notices. . If you have some symptoms but not all symptoms, continue to monitor at home and seek medical attention if your symptoms worsen. . If you are having a medical emergency, call 911.   [place] / e-Visit: [email or web address]         [facility]: [facility]: [phone]                   [facility]: [phone]

## 2018-10-11 LAB — T4: T4, Total: 14.5 ug/dL — ABNORMAL HIGH (ref 4.5–12.0)

## 2018-10-11 LAB — CANCER ANTIGEN 27.29: CA 27.29: 39.5 U/mL — ABNORMAL HIGH (ref 0.0–38.6)

## 2018-10-17 ENCOUNTER — Ambulatory Visit (INDEPENDENT_AMBULATORY_CARE_PROVIDER_SITE_OTHER): Payer: Medicare HMO | Admitting: Nurse Practitioner

## 2018-10-17 ENCOUNTER — Other Ambulatory Visit: Payer: Self-pay

## 2018-10-17 ENCOUNTER — Encounter: Payer: Self-pay | Admitting: Nurse Practitioner

## 2018-10-17 DIAGNOSIS — N183 Chronic kidney disease, stage 3 unspecified: Secondary | ICD-10-CM

## 2018-10-17 DIAGNOSIS — M25511 Pain in right shoulder: Secondary | ICD-10-CM

## 2018-10-17 DIAGNOSIS — J301 Allergic rhinitis due to pollen: Secondary | ICD-10-CM

## 2018-10-17 DIAGNOSIS — M1712 Unilateral primary osteoarthritis, left knee: Secondary | ICD-10-CM

## 2018-10-17 DIAGNOSIS — Z0000 Encounter for general adult medical examination without abnormal findings: Secondary | ICD-10-CM | POA: Diagnosis not present

## 2018-10-17 DIAGNOSIS — E890 Postprocedural hypothyroidism: Secondary | ICD-10-CM | POA: Diagnosis not present

## 2018-10-17 DIAGNOSIS — J910 Malignant pleural effusion: Secondary | ICD-10-CM | POA: Diagnosis not present

## 2018-10-17 DIAGNOSIS — C50911 Malignant neoplasm of unspecified site of right female breast: Secondary | ICD-10-CM | POA: Diagnosis not present

## 2018-10-17 DIAGNOSIS — G8929 Other chronic pain: Secondary | ICD-10-CM

## 2018-10-17 MED ORDER — ZOSTER VAC RECOMB ADJUVANTED 50 MCG/0.5ML IM SUSR: 0.5000 mL | Freq: Once | INTRAMUSCULAR | 1 refills | Status: AC

## 2018-10-17 NOTE — Patient Instructions (Signed)
Kayla Price , Thank you for taking time to come for your Medicare Wellness Visit. I appreciate your ongoing commitment to your health goals. Please review the following plan we discussed and let me know if I can assist you in the future.   Screening recommendations/referrals: Colonoscopy up to date Mammogram up to date Bone Density will recheck at 35 Recommended yearly ophthalmology/optometry visit for glaucoma screening and checkup Recommended yearly dental visit for hygiene and checkup  Vaccinations: Influenza vaccine- due Sept 2020 Pneumococcal vaccine up to date Tdap vaccine up to date Shingles vaccine- will send to pharmacy    Advanced directives: to complete and bring to office  Conditions/risks identified: encouraged to lose weight.  Next appointment: 1 year.    Preventive Care 9 Years and Older, Female Preventive care refers to lifestyle choices and visits with your health care provider that can promote health and wellness. What does preventive care include?  A yearly physical exam. This is also called an annual well check.  Dental exams once or twice a year.  Routine eye exams. Ask your health care provider how often you should have your eyes checked.  Personal lifestyle choices, including:  Daily care of your teeth and gums.  Regular physical activity.  Eating a healthy diet.  Avoiding tobacco and drug use.  Limiting alcohol use.  Practicing safe sex.  Taking low-dose aspirin every day.  Taking vitamin and mineral supplements as recommended by your health care provider. What happens during an annual well check? The services and screenings done by your health care provider during your annual well check will depend on your age, overall health, lifestyle risk factors, and family history of disease. Counseling  Your health care provider may ask you questions about your:  Alcohol use.  Tobacco use.  Drug use.  Emotional well-being.  Home and  relationship well-being.  Sexual activity.  Eating habits.  History of falls.  Memory and ability to understand (cognition).  Work and work Statistician.  Reproductive health. Screening  You may have the following tests or measurements:  Height, weight, and BMI.  Blood pressure.  Lipid and cholesterol levels. These may be checked every 5 years, or more frequently if you are over 56 years old.  Skin check.  Lung cancer screening. You may have this screening every year starting at age 87 if you have a 30-pack-year history of smoking and currently smoke or have quit within the past 15 years.  Fecal occult blood test (FOBT) of the stool. You may have this test every year starting at age 49.  Flexible sigmoidoscopy or colonoscopy. You may have a sigmoidoscopy every 5 years or a colonoscopy every 10 years starting at age 93.  Hepatitis C blood test.  Hepatitis B blood test.  Sexually transmitted disease (STD) testing.  Diabetes screening. This is done by checking your blood sugar (glucose) after you have not eaten for a while (fasting). You may have this done every 1-3 years.  Bone density scan. This is done to screen for osteoporosis. You may have this done starting at age 45.  Mammogram. This may be done every 1-2 years. Talk to your health care provider about how often you should have regular mammograms. Talk with your health care provider about your test results, treatment options, and if necessary, the need for more tests. Vaccines  Your health care provider may recommend certain vaccines, such as:  Influenza vaccine. This is recommended every year.  Tetanus, diphtheria, and acellular pertussis (Tdap, Td) vaccine.  You may need a Td booster every 10 years.  Zoster vaccine. You may need this after age 24.  Pneumococcal 13-valent conjugate (PCV13) vaccine. One dose is recommended after age 42.  Pneumococcal polysaccharide (PPSV23) vaccine. One dose is recommended after  age 68. Talk to your health care provider about which screenings and vaccines you need and how often you need them. This information is not intended to replace advice given to you by your health care provider. Make sure you discuss any questions you have with your health care provider. Document Released: 07/08/2015 Document Revised: 02/29/2016 Document Reviewed: 04/12/2015 Elsevier Interactive Patient Education  2017 [place] Prevention in the Home Falls can cause injuries. They can happen to people of all ages. There are many things you can do to make your home safe and to help prevent falls. What can I do on the outside of my home?  Regularly fix the edges of walkways and driveways and fix any cracks.  Remove anything that might make you trip as you walk through a door, such as a raised step or threshold.  Trim any bushes or trees on the path to your home.  Use bright outdoor lighting.  Clear any walking paths of anything that might make someone trip, such as rocks or tools.  Regularly check to see if handrails are loose or broken. Make sure that both sides of any steps have handrails.  Any raised decks and porches should have guardrails on the edges.  Have any leaves, snow, or ice cleared regularly.  Use sand or salt on walking paths during winter.  Clean up any spills in your garage right away. This includes oil or grease spills. What can I do in the bathroom?  Use night lights.  Install grab bars by the toilet and in the tub and shower. Do not use towel bars as grab bars.  Use non-skid mats or decals in the tub or shower.  If you need to sit down in the shower, use a plastic, non-slip stool.  Keep the floor dry. Clean up any water that spills on the floor as soon as it happens.  Remove soap buildup in the tub or shower regularly.  Attach bath mats securely with double-sided non-slip rug tape.  Do not have throw rugs and other things on the floor that can  make you trip. What can I do in the bedroom?  Use night lights.  Make sure that you have a light by your bed that is easy to reach.  Do not use any sheets or blankets that are too big for your bed. They should not hang down onto the floor.  Have a firm chair that has side arms. You can use this for support while you get dressed.  Do not have throw rugs and other things on the floor that can make you trip. What can I do in the kitchen?  Clean up any spills right away.  Avoid walking on wet floors.  Keep items that you use a lot in easy-to-reach places.  If you need to reach something above you, use a strong step stool that has a grab bar.  Keep electrical cords out of the way.  Do not use floor polish or wax that makes floors slippery. If you must use wax, use non-skid floor wax.  Do not have throw rugs and other things on the floor that can make you trip. What can I do with my stairs?  Do not leave any  items on the stairs.  Make sure that there are handrails on both sides of the stairs and use them. Fix handrails that are broken or loose. Make sure that handrails are as long as the stairways.  Check any carpeting to make sure that it is firmly attached to the stairs. Fix any carpet that is loose or worn.  Avoid having throw rugs at the top or bottom of the stairs. If you do have throw rugs, attach them to the floor with carpet tape.  Make sure that you have a light switch at the top of the stairs and the bottom of the stairs. If you do not have them, ask someone to add them for you. What else can I do to help prevent falls?  Wear shoes that:  Do not have high heels.  Have rubber bottoms.  Are comfortable and fit you well.  Are closed at the toe. Do not wear sandals.  If you use a stepladder:  Make sure that it is fully opened. Do not climb a closed stepladder.  Make sure that both sides of the stepladder are locked into place.  Ask someone to hold it for you,  if possible.  Clearly mark and make sure that you can see:  Any grab bars or handrails.  First and last steps.  Where the edge of each step is.  Use tools that help you move around (mobility aids) if they are needed. These include:  Canes.  Walkers.  Scooters.  Crutches.  Turn on the lights when you go into a dark area. Replace any light bulbs as soon as they burn out.  Set up your furniture so you have a clear path. Avoid moving your furniture around.  If any of your floors are uneven, fix them.  If there are any pets around you, be aware of where they are.  Review your medicines with your doctor. Some medicines can make you feel dizzy. This can increase your chance of falling. Ask your doctor what other things that you can do to help prevent falls. This information is not intended to replace advice given to you by your health care provider. Make sure you discuss any questions you have with your health care provider. Document Released: 04/07/2009 Document Revised: 11/17/2015 Document Reviewed: 07/16/2014 Elsevier Interactive Patient Education  2017 [employer].

## 2018-10-17 NOTE — Progress Notes (Signed)
Subjective:   Kayla Price is a 63 y.o. female who presents for Medicare Annual (Subsequent) preventive examination.  Review of Systems:   Cardiac Risk Factors include: obesity (BMI >30kg/m2);hypertension;dyslipidemia     Objective:     Vitals: LMP 11/06/2003   There is no height or weight on file to calculate BMI.  Advanced Directives 10/17/2018 10/17/2018 09/26/2018 07/14/2018 05/30/2018 05/15/2018 09/20/2017  Does Patient Have a Medical Advance Directive? No No No Yes;No No No No  Type of Advance Directive - - - - - - -  Does patient want to make changes to medical advance directive? - - - - - - -  Copy of [place] in Chart? - - - - - - -  Would patient like information on creating a medical advance directive? Yes (MAU/Ambulatory/Procedural Areas - Information given) Yes (MAU/Ambulatory/Procedural Areas - Information given) No - Patient declined No - Patient declined No - Patient declined No - Patient declined Yes (MAU/Ambulatory/Procedural Areas - Information given)    Tobacco Social History   Tobacco Use  Smoking Status Never Smoker  Smokeless Tobacco Never Used     Counseling given: Not Answered   Clinical Intake:  Pre-visit preparation completed: Yes  Pain : No/denies pain     BMI - recorded: 39.4 Nutritional Status: BMI > 30  Obese Nutritional Risks: None Diabetes: No  How often do you need to have someone help you when you read instructions, pamphlets, or other written materials from your doctor or pharmacy?: 1 - Never  Interpreter Needed?: No     Past Medical History:  Diagnosis Date  . Alopecia areata 11/28/2009  . Bell's palsy   . Cancer [facility]) 2005   Breast cancer   chemotherapy and radiation  . CKD (chronic kidney disease) stage 3, GFR 30-59 ml/min (HCC) 06/08/2015  . Dry eye syndrome   . Family history of lung cancer   . Family history of non-Hodgkin's lymphoma   . Fasting hyperglycemia   . Gestational diabetes    2001   . HIP FRACTURE, RIGHT 05/06/2008  . History of kidney stones   . HIV DISEASE 03/27/2006  . HYPERLIPIDEMIA, MIXED 12/15/2007  . HYPERTENSION 03/27/2006  . 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 MENORRHAGIA, POSTMENOPAUSAL 02/03/2009  . Osteoarthritis of left knee 06/08/2015  . OSTEOARTHROSIS, LOCAL, SCND, UNSPC SITE 04/07/2007  . PVD 04/07/2007  . Tinea capitis   . TRIGGER FINGER 05/06/2008  . Unspecified vitamin D deficiency 08/06/2007   Past Surgical History:  Procedure Laterality Date  . BREAST LUMPECTOMY Right    2005  . CHEST TUBE INSERTION Right 05/30/2018   Procedure: INSERTION PLEURAL DRAINAGE CATHETER;  Surgeon: Grace Isaac, MD;  Location: [place];  Service: Thoracic;  Laterality: Right;  . COLONOSCOPY    . COLONOSCOPY WITH ESOPHAGOGASTRODUODENOSCOPY (EGD)    . ENDOBRONCHIAL ULTRASOUND Bilateral 05/15/2018   Procedure: ENDOBRONCHIAL ULTRASOUND;  Surgeon: Collene Gobble, MD;  Location: [facility];  Service: Cardiopulmonary;  Laterality: Bilateral;  . FINE NEEDLE ASPIRATION BIOPSY  05/15/2018   Procedure: FINE NEEDLE ASPIRATION BIOPSY;  Surgeon: Collene Gobble, MD;  Location: [facility];  Service: Cardiopulmonary;;  . FLEXIBLE BRONCHOSCOPY  05/15/2018   Procedure: FLEXIBLE BRONCHOSCOPY;  Surgeon: Collene Gobble, MD;  Location: [facility];  Service: Cardiopulmonary;;  . HYSTEROSCOPY  2006  . IR IMAGING GUIDED PORT INSERTION  07/14/2018  . IR THORACENTESIS ASP PLEURAL SPACE W/IMG GUIDE  04/14/2018  . IR THORACENTESIS ASP PLEURAL SPACE W/IMG GUIDE  04/28/2018  .  KNEE ARTHROSCOPY Left 2002  . LYMPH NODE DISSECTION  2005  . MASTECTOMY MODIFIED RADICAL Right 11/05/2016   Procedure: RIGHT MASTECTOMY MODIFIED RADICAL;  Surgeon: Fanny Skates, MD;  Location: [place];  Service: General;  Laterality: Right;  . MODIFIED RADICAL MASTECTOMY Right 11/05/2016  . placement   of port-a-cath    . PLEURAL BIOPSY Right 05/30/2018   Procedure: PLEURAL BIOPSY;  Surgeon: Grace Isaac, MD;  Location: [place];  Service: Thoracic;  Laterality: Right;  . PLEURAL EFFUSION DRAINAGE Right 05/30/2018   Procedure: DRAINAGE OF PLEURAL EFFUSION;  Surgeon: Grace Isaac, MD;  Location: [place];  Service: Thoracic;  Laterality: Right;  . PORT-A-CATH REMOVAL  2006   insertion 2005  . PORT-A-CATH REMOVAL N/A 03/29/2017   Procedure: REMOVAL PORT-A-CATH;  Surgeon: Fanny Skates, MD;  Location: [place];  Service: General;  Laterality: N/A;  . PORTACATH PLACEMENT Right 11/05/2016   Procedure: INSERTION PORT-A-CATH WITH ULTRA SOUND;  Surgeon: Fanny Skates, MD;  Location: [place];  Service: General;  Laterality: Right;  . removal of port a cath  12/2014  . TALC PLEURODESIS Right 05/30/2018   Procedure: Pietro Cassis;  Surgeon: Grace Isaac, MD;  Location: [place];  Service: Thoracic;  Laterality: Right;  . THYROID SURGERY  2009   Ablation   . TOTAL KNEE ARTHROPLASTY Left 02/06/2016   Procedure: TOTAL KNEE ARTHROPLASTY;  Surgeon: Frederik Pear, MD;  Location: [place];  Service: Orthopedics;  Laterality: Left;  . TUBAL LIGATION    . VIDEO ASSISTED THORACOSCOPY Right 05/30/2018   Procedure: VIDEO ASSISTED THORACOSCOPY;  Surgeon: Grace Isaac, MD;  Location[facility];  Service: Thoracic;  Laterality: Right;   Family History  Problem Relation Age of Onset  . Heart attack Brother        Massive MI in 52s  . Stroke Brother        CAD  . Kidney disease Mother   . Stroke Mother   . Diabetes Mother   . Liver disease Sister   . COPD Sister        had I-131 rx of hyperthyroidism  . Diabetes Sister   . Stroke Sister   . Lung cancer Paternal Uncle        hx smoking  . Cancer Cousin   . Non-Hodgkin's lymphoma Cousin 25       cancer x3, in prostate and lung- unsure if met/spread or if primaries  . Heart failure Father   . Heart disease Father   . Arthritis Father   . Sarcoidosis Sister    Social History   Socioeconomic History  . Marital status: Widowed    Spouse name: Not  on file  . Number of children: Not on file  . Years of education: Not on file  . Highest education level: Not on file  Occupational History  . Occupation: Paediatric nurse: UNEMPLOYED  Social Needs  . Financial resource strain: Not hard at all  . Food insecurity:    Worry: Never true    Inability: Never true  . Transportation needs:    Medical: No    Non-medical: No  Tobacco Use  . Smoking status: Never Smoker  . Smokeless tobacco: Never Used  Substance and Sexual Activity  . Alcohol use: No  . Drug use: No  . Sexual activity: Not Currently    Birth control/protection: Post-menopausal    Comment: declined condoms  Lifestyle  . Physical activity:    Days per  week: 3 days    Minutes per session: 120 min  . Stress: Not at all  Relationships  . Social connections:    Talks on phone: More than three times a week    Gets together: More than three times a week    Attends religious service: More than 4 times per year    Active member of club or organization: No    Attends meetings of clubs or organizations: Never    Relationship status: Widowed  Other Topics Concern  . Not on file  Social History Narrative   Single/widow   Diet: good   Do you drink/eat things with caffeine? Tea occasionally   Marital status: Widowed  What year were you married? 1993   Do you live in a house, apartment,assisted living, condo,trailer,ect.)? House   Is it one or more stories? Two   How many persons live in your home? 4   Do you have any pets in you home? No   Current or past profession: Tour manager   Do you exercise? Yes  Type&how often: Stationary Bike, Water Exercise 3-4 x week   Do you have a living will? Yes   Do you have a DNR form? No  If not do you what one?   Do you have signed POA/HPOA forms? No   If so, please bring to your appointment.                Outpatient Encounter Medications as of 10/17/2018  Medication Sig  . CALCIUM CITRATE-VITAMIN D3 PO Take by mouth.  630 mg of calcium and 13 mcg d3, take once daily  . Cholecalciferol (D3 ADULT PO) Take 25 mcg by mouth daily.  . Coenzyme Q10 (COQ10) 100 MG CAPS Take by mouth daily.  . darunavir-cobicistat (PREZCOBIX) 800-150 MG tablet Take 1 tablet by mouth at bedtime. SWALLOW WHOLE. DO NOT CRUSH, BREAK OR CHEW TABLETS. TAKE WITH FOOD.  . Dolutegravir-Rilpivirine (JULUCA) 50-25 MG TABS Take 1 tablet by mouth at bedtime. Take with the Prezcobix.  Marland Kitchen ELDERBERRY PO Take 50 mg by mouth daily.  Marland Kitchen HYDROcodone-acetaminophen (NORCO/VICODIN) 5-325 MG tablet TAKE ONE TABLET BY MOUTH EVERY 6 HOURS AS NEEDED FOR MODERATE PAIN  . hydrocortisone cream 1 % Apply 1 application topically daily as needed for itching.  . levothyroxine (SYNTHROID, LEVOTHROID) 175 MCG tablet Take 1 tablet (175 mcg total) by mouth daily before breakfast.  . lidocaine-prilocaine (EMLA) cream Apply 1 application topically as needed. Apply to port site 1 hour prior to access  . Loratadine 10 MG CAPS Take by mouth as needed.  . Menthol, Topical Analgesic, (BENGAY EX) Apply 1 application topically daily as needed (muscle pain).  . Methylsulfonylmethane (MSM) 1000 MG TABS Take by mouth daily.  . Naphazoline-Glycerin (REDNESS RELIEF OP) Place 1 drop into both eyes daily as needed (redness).  Marland Kitchen olmesartan-hydrochlorothiazide (BENICAR HCT) 40-25 MG tablet TAKE 1 TABLET BY MOUTH DAILY.  Marland Kitchen Omega-3 Fatty Acids (FISH OIL) 1000 MG CAPS Take by mouth daily.  . Pitavastatin Calcium 4 MG TABS Take 1 tablet (4 mg total) by mouth daily at 12 noon. This may be placebo, study provided. Do not dispense.  . potassium gluconate 595 (99 K) MG TABS tablet Take 595 mg by mouth daily.  . Probiotic Product (PROBIOTIC DAILY PO) Take by mouth daily.  Marland Kitchen SELZENTRY 150 MG tablet TAKE ONE TABLET BY MOUTH TWICE DAILY  . tobramycin-dexamethasone (TOBRADEX) ophthalmic solution Place 2 drops into the right eye every 4 (four) hours while  awake.  . Vitamin E 180 MG CAPS Take by mouth  daily.   No facility-administered encounter medications on file as of 10/17/2018.     Activities of Daily Living In your present state of health, do you have any difficulty performing the following activities: 10/17/2018 05/30/2018  Hearing? N N  Vision? N N  Difficulty concentrating or making decisions? N N  Walking or climbing stairs? Y N  Comment knee pain -  Dressing or bathing? N N  Doing errands, shopping? N Y  Conservation officer, nature and eating ? N -  Using the Toilet? N -  In the past six months, have you accidently leaked urine? Y -  Do you have problems with loss of bowel control? N -  Managing your Medications? N -  Managing your Finances? N -  Housekeeping or managing your Housekeeping? N -  Some recent data might be hidden    Patient Care Team: Lauree Chandler, NP as PCP - General (Nurse Practitioner) Tommy Medal, Lavell Islam, MD as PCP - Infectious Diseases (Infectious Diseases) Clent Jacks, MD as Consulting Physician (Ophthalmology) Renato Shin, MD as Consulting Physician (Endocrinology) Frederik Pear, MD as Consulting Physician (Orthopedic Surgery) Magrinat, Virgie Dad, MD as Consulting Physician (Oncology) Fanny Skates, MD as Consulting Physician (General Surgery) Donnamae Jude, MD as Consulting Physician (Obstetrics and Gynecology) Delice Bison Charlestine Massed, NP as Nurse Practitioner (Hematology and Oncology) Webb Laws, [place] as Referring Physician (Optometry)    Assessment:   This is a routine wellness examination for [facility].  Exercise Activities and Dietary recommendations Current Exercise Habits: Home exercise routine, Type of exercise: walking;strength training/weights;yoga, Time (Minutes): 20, Frequency (Times/Week): 3, Weekly Exercise (Minutes/Week): 60, Intensity: Moderate, Exercise limited by: orthopedic condition(s)  Goals    . DIET - REDUCE CALORIE INTAKE     To limit fat and sodium intake to help promote healthy weight.     . Exercise 3x per  week (30 min per time)     Starting 09/18/16 I will continue my days at the gym and try to lose weight.     . Weight (lb) < 200 lb (90.7 kg)       Fall Risk Fall Risk  10/17/2018 10/17/2018 10/09/2018 09/09/2018 05/19/2018  Falls in the past year? 0 0 0 0 0  Number falls in past yr: 0 0 - 0 -  Injury with Fall? 0 0 - 0 -  Comment - - - - -  Risk for fall due to : - - - - -  Risk for fall due to: Comment - - - - -  Follow up - - - - -   Is the patient's home free of loose throw rugs in walkways, pet beds, electrical cords, etc?   yes      Grab bars in the bathroom? yes      Handrails on the stairs?   yes      Adequate lighting?   yes  Timed Get Up and Go performed: na  Depression Screen PHQ 2/9 Scores 10/17/2018 10/17/2018 10/09/2018 05/19/2018  PHQ - 2 Score 0 0 0 0     Cognitive Function MMSE - Mini Mental State Exam 09/18/2016  Not completed: Unable to complete     6CIT Screen 10/17/2018  What Year? 0 points  What month? 0 points  What time? 0 points  Count back from 20 0 points  Months in reverse 0 points  Repeat phrase 0 points  Total Score 0  Immunization History  Administered Date(s) Administered  . Hepatitis B 07/11/1992, 11/17/2001, 03/02/2002, 04/09/2011  . Influenza Split 04/10/2011, 03/05/2012  . Influenza Whole 03/27/2006, 04/07/2007, 05/06/2008, 05/16/2009, 09/27/2010  . Influenza,inj,Quad PF,6+ Mos 03/09/2013, 04/23/2014, 03/22/2016, 04/18/2018  . Influenza-Unspecified 06/08/2015, 03/06/2017  . Pneumococcal Conjugate-13 01/16/2018  . Pneumococcal Polysaccharide-23 03/27/2006, 02/27/2012    Qualifies for Shingles Vaccine?yes  Screening Tests Health Maintenance  Topic Date Due  . TETANUS/TDAP  01/07/1975  . INFLUENZA VACCINE  01/24/2019  . Fecal DNA (Cologuard)  09/27/2019  . MAMMOGRAM  12/12/2019  . PAP SMEAR-Modifier  12/26/2019  . Hepatitis C Screening  Completed  . HIV Screening  Completed    Cancer Screenings: Lung: Low Dose CT Chest  recommended if Age 50-80 years, 30 pack-year currently smoking OR have quit w/in 15years. Patient does not qualify. Breast:  Up to date on Mammogram? Yes   Up to date of Bone Density/Dexa? Yes Colorectal: yes, cologuard due 2021  Additional Screenings:  Hepatitis C Screening: done     Plan:    t   I have personally reviewed and noted the following in the patient's chart:   . Medical and social history . Use of alcohol, tobacco or illicit drugs  . Current medications and supplements . Functional ability and status . Nutritional status . Physical activity . Advanced directives . List of other physicians . Hospitalizations, surgeries, and ER visits in previous 12 months . Vitals . Screenings to include cognitive, depression, and falls . Referrals and appointments  In addition, I have reviewed and discussed with patient certain preventive protocols, quality metrics, and best practice recommendations. A written personalized care plan for preventive services as well as general preventive health recommendations were provided to patient.     Lauree Chandler, NP  10/17/2018

## 2018-10-17 NOTE — Progress Notes (Signed)
    This service is provided via telemedicine  No vital signs collected/recorded due to the encounter was a telemedicine visit.   Location of patient (ex: home, work):  Home  Patient consents to a telephone visit:  Yes  Location of the provider (ex: office, home):  [facility], Office   Name of any referring provider:  N/A  Names of all persons participating in the telemedicine service and their role in the encounter:  S.Chrae B/CMA, [name], NP, and Patient    Time spent on call:  3 min with medical assistant

## 2018-10-17 NOTE — Addendum Note (Signed)
Addended by: Logan Bores on: 10/17/2018 11:21 AM   Modules accepted: Orders

## 2018-10-17 NOTE — Patient Instructions (Signed)
To use Aspercreme with lidocaine to shoulder to help with pain.  Continue lifestyle modifications.  Keep up the good work with diet changes and increasing activity

## 2018-10-17 NOTE — Progress Notes (Signed)
This service is provided via telemedicine  No vital signs collected/recorded due to the encounter was a telemedicine visit.   Location of patient (ex: home, work): Home  Patient consents to a telephone visit:  Yes  Location of the provider (ex: office, home):  [facility], Office   Name of any referring provider:  N/A Names of all persons participating in the telemedicine service and their role in the encounter:  S.Chrae B/CMA, [name], NP, and Patient    Time spent on call: 16 min with medical assistant   Virtual Visit via Telephone Note  I connected with Kayla Price on 10/17/18 at  8:30 AM EDT by telephone and verified that I am speaking with the correct person using two identifiers.   I discussed the limitations, risks, security and privacy concerns of performing an evaluation and management service by telephone and the availability of in person appointments. I also discussed with the patient that there may be a patient responsible charge related to this service. The patient expressed understanding and agreed to proceed.     Careteam: Patient Care Team: Lauree Chandler, NP as PCP - General (Nurse Practitioner) Tommy Medal, Lavell Islam, MD as PCP - Infectious Diseases (Infectious Diseases) Clent Jacks, MD as Consulting Physician (Ophthalmology) Renato Shin, MD as Consulting Physician (Endocrinology) Frederik Pear, MD as Consulting Physician (Orthopedic Surgery) Magrinat, Virgie Dad, MD as Consulting Physician (Oncology) Fanny Skates, MD as Consulting Physician (General Surgery) Donnamae Jude, MD as Consulting Physician (Obstetrics and Gynecology) Delice Bison Charlestine Massed, NP as Nurse Practitioner (Hematology and Oncology)  Advanced Directive information Does Patient Have a Medical Advance Directive?: No, Would patient like information on creating a medical advance directive?: Yes (MAU/Ambulatory/Procedural Areas - Information given)(Patient with  paperwork from previous visit )  Allergies  Allergen Reactions  . Lisinopril Anaphylaxis and Swelling    Swelling of tongue and mouth 11/05/16- tolerates Olmesartan  . Pepcid [Famotidine] Other (See Comments)    PPI H2, BLOCKERS LOWER GASTRIC PH WHICH WOULD LEAD TO SUBTHERAPEUTIC RILPIVIRINE LEVELS AND POTENTIAL VIROLOGICAL FAILURE WITH RESISTANCE  . Prilosec [Omeprazole] Other (See Comments)    PPI H2, BLOCKERS LOWER GASTRIC PH WHICH WOULD LEAD TO SUBTHERAPEUTIC RILPIVIRINE LEVELS AND POTENTIAL VIROLOGICAL FAILURE WITH RESISTANCE  . Tums [Calcium Carbonate Antacid] Other (See Comments)    TUMS ANTACIDS CAN LOWER GASTRIC PH WHICH COULD  LEAD TO SUBTHERAPEUTIC RILPIVIRINE LEVELS AND POTENTIAL VIROLOGICAL FAILURE WITH RESISTANCE TUMS CAN BE GIVEN BUT NEED CONSULT WITH ID PHARMACY RE TIMING. I PREFER HER TO AVOID ALL TOGETHER    Chief Complaint  Patient presents with  . Medical Management of Chronic Issues    6 month follow-up. Ongoing issues with right shoulder. Patient with ongoing aches and pain. Televisit   . Anorexia    Patient with decreased appetite and admits to weight loss (not rapid). Current weigth at home 251.8      HPI: Patient is a 63 y.o. female routine follow up.   Hx of Breast CA with malignant plural effusion- pt currently undergoes immunotherapy and following with oncologist, Dr Jana Hakim. S/p VATS done in  05/30/2018 by Dr. Servando Snare. Incision heals nicely. Pt reports occasional mild pain at the incision. Pt has increased SOB with stairs climbing and walking long distance. SOB resolves with rest. Infusions are every other week.  Denies n/v/d.   Attempting to lose weight/not dieting necessarily but lifestyle modifications.  States decrease in appetite which she noticed when the fluid started on her lungs and  feels fuller quicker. But attempting lifestyle changes More active and has changed diet. Eating healthier and less fat/less spice which does better on her stomach.  Drinking meal replacement.  Aware she needs to loose weight and states this will help her immune system.  HTN- overall controlled.. Pt currently takes Olmesartan-hydrochlorothiazide  Ostearthritis- stable condition. Pt still experiences pain in her knees. Pt takes Hydrocodone-apap for pain with good results.  not going to orthopedic at this time. Avoiding NSAIDS.   Reports pain in her right shoulder, surgeon stated that there were nerve ending that were damaged when she had her nodes removed. Numb under her armpit, above shoulder has pain in the shoulder joint and collar bone area. Dull ache. 4/10. Hydrocodone-apap helps pain. Since she has been moving it is worse (does not lift more than 20 lbs which was ordered by her doctor). Certain ways she lays makes it worse.   Hyperlipidemia- pt currently participates in the study for an experimental drug. Pt closely followed by infectious disease and has follow up lipids scheduled.   HIV- per last ID visit 10/09/2018- She is highly adherent to her antiretroviral regimen JULUCA and Prezcobix and maintaining a viral load that is less than 20 and a healthy CD4 count.  Hypothyroid- endocrinology has change synthroid but TSH now stable.   CKD- Cr 1.4 last week. Encouraged to drink plenty of fluids.  Review of Systems:  Review of Systems  Constitutional: Negative for chills, fever, malaise/fatigue and weight loss.  HENT: Negative for congestion, ear pain, sinus pain and sore throat.   Eyes: Negative for discharge.  Respiratory: Negative for cough, shortness of breath and wheezing.   Cardiovascular: Negative for chest pain, palpitations, claudication and leg swelling.  Gastrointestinal: Negative for abdominal pain, constipation, diarrhea, nausea and vomiting.  Genitourinary: Positive for urgency. Negative for dysuria and frequency.  Musculoskeletal: Positive for joint pain and myalgias.  Skin: Negative for itching and rash.  Neurological: Negative for  dizziness, weakness and headaches.    Past Medical History:  Diagnosis Date  . Alopecia areata 11/28/2009  . Bell's palsy   . Cancer [facility]) 2005   Breast cancer   chemotherapy and radiation  . CKD (chronic kidney disease) stage 3, GFR 30-59 ml/min (HCC) 06/08/2015  . Dry eye syndrome   . Family history of lung cancer   . Family history of non-Hodgkin's lymphoma   . Fasting hyperglycemia   . Gestational diabetes    2001  . HIP FRACTURE, RIGHT 05/06/2008  . History of kidney stones   . HIV DISEASE 03/27/2006  . HYPERLIPIDEMIA, MIXED 12/15/2007  . HYPERTENSION 03/27/2006  . 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 MENORRHAGIA, POSTMENOPAUSAL 02/03/2009  . Osteoarthritis of left knee 06/08/2015  . OSTEOARTHROSIS, LOCAL, SCND, UNSPC SITE 04/07/2007  . PVD 04/07/2007  . Tinea capitis   . TRIGGER FINGER 05/06/2008  . Unspecified vitamin D deficiency 08/06/2007   Past Surgical History:  Procedure Laterality Date  . BREAST LUMPECTOMY Right    2005  . CHEST TUBE INSERTION Right 05/30/2018   Procedure: INSERTION PLEURAL DRAINAGE CATHETER;  Surgeon: Grace Isaac, MD;  Location: [place];  Service: Thoracic;  Laterality: Right;  . COLONOSCOPY    . COLONOSCOPY WITH ESOPHAGOGASTRODUODENOSCOPY (EGD)    . ENDOBRONCHIAL ULTRASOUND Bilateral 05/15/2018   Procedure: ENDOBRONCHIAL ULTRASOUND;  Surgeon: Collene Gobble, MD;  Location: [facility];  Service: Cardiopulmonary;  Laterality: Bilateral;  . FINE NEEDLE ASPIRATION BIOPSY  05/15/2018   Procedure: FINE NEEDLE ASPIRATION BIOPSY;  Surgeon: Lamonte Sakai,  Rose Fillers, MD;  Location: [facility];  Service: Cardiopulmonary;;  . FLEXIBLE BRONCHOSCOPY  05/15/2018   Procedure: FLEXIBLE BRONCHOSCOPY;  Surgeon: Collene Gobble, MD;  Location: [facility];  Service: Cardiopulmonary;;  . HYSTEROSCOPY  2006  . IR IMAGING GUIDED PORT INSERTION  07/14/2018  . IR THORACENTESIS ASP PLEURAL SPACE W/IMG GUIDE  04/14/2018  . IR THORACENTESIS ASP PLEURAL SPACE W/IMG  GUIDE  04/28/2018  . KNEE ARTHROSCOPY Left 2002  . LYMPH NODE DISSECTION  2005  . MASTECTOMY MODIFIED RADICAL Right 11/05/2016   Procedure: RIGHT MASTECTOMY MODIFIED RADICAL;  Surgeon: Fanny Skates, MD;  Location: [place];  Service: General;  Laterality: Right;  . MODIFIED RADICAL MASTECTOMY Right 11/05/2016  . placement   of port-a-cath    . PLEURAL BIOPSY Right 05/30/2018   Procedure: PLEURAL BIOPSY;  Surgeon: Grace Isaac, MD;  Location: [place];  Service: Thoracic;  Laterality: Right;  . PLEURAL EFFUSION DRAINAGE Right 05/30/2018   Procedure: DRAINAGE OF PLEURAL EFFUSION;  Surgeon: Grace Isaac, MD;  Location: [place];  Service: Thoracic;  Laterality: Right;  . PORT-A-CATH REMOVAL  2006   insertion 2005  . PORT-A-CATH REMOVAL N/A 03/29/2017   Procedure: REMOVAL PORT-A-CATH;  Surgeon: Fanny Skates, MD;  Location: [place];  Service: General;  Laterality: N/A;  . PORTACATH PLACEMENT Right 11/05/2016   Procedure: INSERTION PORT-A-CATH WITH ULTRA SOUND;  Surgeon: Fanny Skates, MD;  Location: [place];  Service: General;  Laterality: Right;  . removal of port a cath  12/2014  . TALC PLEURODESIS Right 05/30/2018   Procedure: Pietro Cassis;  Surgeon: Grace Isaac, MD;  Location: [place];  Service: Thoracic;  Laterality: Right;  . THYROID SURGERY  2009   Ablation   . TOTAL KNEE ARTHROPLASTY Left 02/06/2016   Procedure: TOTAL KNEE ARTHROPLASTY;  Surgeon: Frederik Pear, MD;  Location: [place];  Service: Orthopedics;  Laterality: Left;  . TUBAL LIGATION    . VIDEO ASSISTED THORACOSCOPY Right 05/30/2018   Procedure: VIDEO ASSISTED THORACOSCOPY;  Surgeon: Grace Isaac, MD;  Location: [facility] OR;  Service: Thoracic;  Laterality: Right;   Social History:   reports that she has never smoked. She has never used smokeless tobacco. She reports that she does not drink alcohol or use drugs.  Family History  Problem Relation Age of Onset  . Heart attack Brother        Massive MI in 52s  . Stroke  Brother        CAD  . Kidney disease Mother   . Stroke Mother   . Diabetes Mother   . Liver disease Sister   . COPD Sister        had I-131 rx of hyperthyroidism  . Diabetes Sister   . Stroke Sister   . Lung cancer Paternal Uncle        hx smoking  . Cancer Cousin   . Non-Hodgkin's lymphoma Cousin 25       cancer x3, in prostate and lung- unsure if met/spread or if primaries  . Heart failure Father   . Heart disease Father   . Arthritis Father   . Sarcoidosis Sister     Medications: Patient's Medications  New Prescriptions   No medications on file  Previous Medications   CALCIUM CITRATE-VITAMIN D3 PO    Take by mouth. 630 mg of calcium and 13 mcg d3, take once daily   CHOLECALCIFEROL (D3 ADULT PO)    Take 25 mcg by mouth daily.   COENZYME  Q10 (COQ10) 100 MG CAPS    Take by mouth daily.   DARUNAVIR-COBICISTAT (PREZCOBIX) 800-150 MG TABLET    Take 1 tablet by mouth at bedtime. SWALLOW WHOLE. DO NOT CRUSH, BREAK OR CHEW TABLETS. TAKE WITH FOOD.   DOLUTEGRAVIR-RILPIVIRINE (JULUCA) 50-25 MG TABS    Take 1 tablet by mouth at bedtime. Take with the Prezcobix.   ELDERBERRY PO    Take 50 mg by mouth daily.   HYDROCODONE-ACETAMINOPHEN (NORCO/VICODIN) 5-325 MG TABLET    TAKE ONE TABLET BY MOUTH EVERY 6 HOURS AS NEEDED FOR MODERATE PAIN   HYDROCORTISONE CREAM 1 %    Apply 1 application topically daily as needed for itching.   LEVOTHYROXINE (SYNTHROID, LEVOTHROID) 175 MCG TABLET    Take 1 tablet (175 mcg total) by mouth daily before breakfast.   LIDOCAINE-PRILOCAINE (EMLA) CREAM    Apply 1 application topically as needed. Apply to port site 1 hour prior to access   LORATADINE 10 MG CAPS    Take by mouth as needed.   MENTHOL, TOPICAL ANALGESIC, (BENGAY EX)    Apply 1 application topically daily as needed (muscle pain).   METHYLSULFONYLMETHANE (MSM) 1000 MG TABS    Take by mouth daily.   NAPHAZOLINE-GLYCERIN (REDNESS RELIEF OP)    Place 1 drop into both eyes daily as needed (redness).    OLMESARTAN-HYDROCHLOROTHIAZIDE (BENICAR HCT) 40-25 MG TABLET    TAKE 1 TABLET BY MOUTH DAILY.   OMEGA-3 FATTY ACIDS (FISH OIL) 1000 MG CAPS    Take by mouth daily.   PITAVASTATIN CALCIUM 4 MG TABS    Take 1 tablet (4 mg total) by mouth daily at 12 noon. This may be placebo, study provided. Do not dispense.   POTASSIUM GLUCONATE 595 (99 K) MG TABS TABLET    Take 595 mg by mouth daily.   PROBIOTIC PRODUCT (PROBIOTIC DAILY PO)    Take by mouth daily.   SELZENTRY 150 MG TABLET    TAKE ONE TABLET BY MOUTH TWICE DAILY   TOBRAMYCIN-DEXAMETHASONE (TOBRADEX) OPHTHALMIC SOLUTION    Place 2 drops into the right eye every 4 (four) hours while awake.   VITAMIN E 180 MG CAPS    Take by mouth daily.  Modified Medications   No medications on file  Discontinued Medications   LORATADINE (CLARITIN) 10 MG TABLET    Take 1 tablet (10 mg total) by mouth daily.   STUDY - REPRIEVE [phone] - PITAVASTATIN 4 MG OR PLACEBO TABLET    Take 1 tablet (4 mg total) by mouth daily.     Physical Exam: unable due to tele-vist    Labs reviewed: Basic Metabolic Panel: Recent Labs    09/12/18 1033 09/18/18 0911 09/26/18 1252 10/10/18 1153  NA 140 141 140 143  K 3.9 4.1 3.7 3.5  CL 103 100 103 106  CO2 29 34* 27 26  GLUCOSE 85 97 86 90  BUN _0 CREATININE 1.49* 1.62* 1.55* 1.41*  CALCIUM 10.1 10.5* 10.5* 10.1  TSH 11.695*  --  4.725* 1.409   Liver Function Tests: Recent Labs    09/12/18 1033 09/18/18 0911 09/26/18 1252 10/10/18 1153  AST _1 35  ALT _2 ALKPHOS 68  --  63 76  BILITOT 0.4 0.5 0.4 0.4  PROT 8.3* 8.3* 8.7* 8.3*  ALBUMIN 3.6  --  3.7 3.5   No results for input(s): LIPASE, AMYLASE in the last 8760 hours. No results for input(s): AMMONIA in the last  8760 hours. CBC: Recent Labs    09/18/18 0911 09/26/18 1252 10/10/18 1153  WBC 4.6 4.9 4.5  NEUTROABS 2,479 2.3 2.3  HGB 14.7 14.1 13.2  HCT 43.6 42.4 40.8  MCV 96.0 96.8 98.1  PLT 217 193 198   Lipid Panel:  Recent Labs    12/25/17 1058 09/18/18 0911  CHOL 199 179  HDL 60 54  LDLCALC 116* 101*  TRIG 115 141  CHOLHDL 3.3 3.3   TSH: Recent Labs    09/12/18 1033 09/26/18 1252 10/10/18 1153  TSH 11.695* 4.725* 1.409   A1C: Lab Results  Component Value Date   HGBA1C 5.3 04/18/2018     Assessment/Plan 1. Malignant pleural effusion VATS by Dr Roxy Horseman in December 2019, doing well without worsening shortness of breath or chest pains.   2. Seasonal allergic rhinitis due to pollen Stable, continues on loratadine as needed  3. Primary osteoarthritis of left knee Ongoing ,using hydrocodone- apap which controls pain.   4. Chronic right shoulder pain Pain after lymphnode removal. Pain is responsive to hydrocodone-apap. Also can use aspercream to area as needed  5. CKD (chronic kidney disease) stage 3, GFR 30-59 ml/min (HCC) Encourage proper hydration and to avoid NSAIDS (Aleve, Advil, Motrin, Ibuprofen)   6. Postablative hypothyroidism Followed by endocrine, recent TSH stable and has been continues on synthroid 175 mcg  7. Obesities, morbid ([place]) -working on diet modifications and increasing activities. Has had positive weight loss.. to continue lifestyle modifications.   8. Recurrent cancer of right breast [facility]) Following with oncology, continues infusions which she reports she is tolerating well.   Next appt: 6 months Kayla Price K. Harle Battiest  [facility] [phone]   Follow Up Instructions:    I discussed the assessment and treatment plan with the patient. The patient was provided an opportunity to ask questions and all were answered. The patient agreed with the plan and demonstrated an understanding of the instructions.   The patient was advised to call back or seek an in-person evaluation if the symptoms worsen or if the condition fails to improve as anticipated.  I provided 14 minutes of non-face-to-face time during this encounter.    Lauree Chandler, NP

## 2018-10-20 ENCOUNTER — Other Ambulatory Visit: Payer: Self-pay | Admitting: Family

## 2018-10-20 ENCOUNTER — Other Ambulatory Visit: Payer: Self-pay | Admitting: Oncology

## 2018-10-20 DIAGNOSIS — B20 Human immunodeficiency virus [HIV] disease: Secondary | ICD-10-CM

## 2018-10-23 ENCOUNTER — Other Ambulatory Visit: Payer: Medicare HMO

## 2018-10-23 MED FILL — JULUCA 50-25 MG TAB: 50-25 | 30 days supply | Qty: 30 | Fill #0

## 2018-10-23 MED FILL — PREZCOBIX 800 MG-150 MG TAB: 800-150 | 30 days supply | Qty: 30 | Fill #0

## 2018-10-23 NOTE — Progress Notes (Signed)
cancer or not.  She is going to  [facility]  Telephone:([phone]) [phone] [phone]) [phone]    ID: Kayla Price DOB: April 12, 1956  MR#: [id number]  VZD#:[id number]  Patient Care Team: Lauree Chandler, NP as PCP - General (Nurse Practitioner) Tommy Medal, Lavell Islam, MD as PCP - Infectious Diseases (Infectious Diseases) Clent Jacks, MD as Consulting Physician (Ophthalmology) Renato Shin, MD as Consulting Physician (Endocrinology) Frederik Pear, MD as Consulting Physician (Orthopedic Surgery) , Virgie Dad, MD as Consulting Physician (Oncology) Fanny Skates, MD as Consulting Physician (General Surgery) Donnamae Jude, MD as Consulting Physician (Obstetrics and Gynecology) Delice Bison Charlestine Massed, NP as Nurse Practitioner (Hematology and Oncology) Webb Laws, [place] as Referring Physician (Optometry) OTHER MD:   CHIEF COMPLAINT: Triple negative breast cancer, recurrent  CURRENT TREATMENT: Atezolizumab   INTERVAL HISTORY: Kayla Price returns today for follow-up of her recurrent triple negative breast cancer.   She continues on atezolizumab, received every 14 days. She tolerates this well and without any noticeable side effects.    Results for Kayla, Price (MRN [id number]) as of 10/24/2018 12:36  Ref. Range 08/01/2018 10:49 08/15/2018 08:27 09/12/2018 10:33 09/26/2018 12:52 10/10/2018 11:53  CA 27.29 Latest Ref Range: 0.0 - 38.6 U/mL 20.3 21.5 29.5 32.0 39.5 (H)     REVIEW OF SYSTEMS: Kayla Price has been taking appropriate precautions against the spread of COVID-19. For exercise, she hasn't had a formal exercise routine, but she has been moving recently. One everything opens back up, she plans to get back to the [facility]. Marco notes some tenderness around her port site; she takes hydrocortisone for pain, with good relief. She notes that she doesn't have pain or nausea, but when she began to eat she would feel full.   The patient denies unusual headaches, visual  changes, nausea, vomiting, or dizziness. There has been no unusual cough, phlegm production, or pleurisy. This been no change in bladder habits. The patient denies unexplained fatigue or unexplained weight loss, bleeding, rash, or fever. A detailed review of systems was otherwise noncontributory.    BREAST CANCER HISTORY: From the original intake note:  Kayla Price is a history of right-sided breast cancer dating back to 2005. She had a lumpectomy with sentinel lymph node sampling, chemotherapy, and radiation. I do not have access to those records at present.  More recently she had bilateral screening mammography at the [facility] 09/25/2016 showing a possible mass in the right breast. Diagnostic mammography with ultrasonography on 09/28/2016 the patient underwent right diagnostic mammography with tomography and right breast ultrasonography. The breast density was category A. In the right breast at the 10:00 position there was an irregular mass measuring 2.5 cm. Ultrasound identified this the 10:00 radiant 10 cm from the nipple measuring 2.4 cm. In the right axilla there was an abnormal lymph node measuring 1.3 cm with other normal-appearing lymph nodes.  On 10/01/2016 she  underwent biopsy of the right breast mass in question as well as the suspicious axillary lymph node. 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 The proliferation marker was 70% in the breast lesion and 50% in the lymph node.  Her subsequent history is as detailed below.    PAST MEDICAL HISTORY: Past Medical History:  Diagnosis Date  . Alopecia areata 11/28/2009  . Bell's palsy   . [facility]) 2005   Breast cancer   chemotherapy and radiation  . CKD (  chronic kidney disease) stage 3, GFR 30-59 ml/min (HCC) 06/08/2015  . Dry eye syndrome   . Family history of lung cancer   .  Family history of non-Hodgkin's lymphoma   . Fasting hyperglycemia   . Gestational diabetes    2001  . HIP FRACTURE, RIGHT 05/06/2008  . History of kidney stones   . HIV DISEASE 03/27/2006  . HYPERLIPIDEMIA, MIXED 12/15/2007  . HYPERTENSION 03/27/2006  . 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 MENORRHAGIA, POSTMENOPAUSAL 02/03/2009  . Osteoarthritis of left knee 06/08/2015  . OSTEOARTHROSIS, LOCAL, SCND, UNSPC SITE 04/07/2007  . PVD 04/07/2007  . Tinea capitis   . TRIGGER FINGER 05/06/2008  . Unspecified vitamin D deficiency 08/06/2007    PAST SURGICAL HISTORY: Past Surgical History:  Procedure Laterality Date  . BREAST LUMPECTOMY Right    2005  . CHEST TUBE INSERTION Right 05/30/2018   Procedure: INSERTION PLEURAL DRAINAGE CATHETER;  Surgeon: Grace Isaac, MD;  Location: [place];  Service: Thoracic;  Laterality: Right;  . COLONOSCOPY    . COLONOSCOPY WITH ESOPHAGOGASTRODUODENOSCOPY (EGD)    . ENDOBRONCHIAL ULTRASOUND Bilateral 05/15/2018   Procedure: ENDOBRONCHIAL ULTRASOUND;  Surgeon: Collene Gobble, MD;  Location: [facility];  Service: Cardiopulmonary;  Laterality: Bilateral;  . FINE NEEDLE ASPIRATION BIOPSY  05/15/2018   Procedure: FINE NEEDLE ASPIRATION BIOPSY;  Surgeon: Collene Gobble, MD;  Location: [facility];  Service: Cardiopulmonary;;  . FLEXIBLE BRONCHOSCOPY  05/15/2018   Procedure: FLEXIBLE BRONCHOSCOPY;  Surgeon: Collene Gobble, MD;  Location: [facility];  Service: Cardiopulmonary;;  . HYSTEROSCOPY  2006  . IR IMAGING GUIDED PORT INSERTION  07/14/2018  . IR THORACENTESIS ASP PLEURAL SPACE W/IMG GUIDE  04/14/2018  . IR THORACENTESIS ASP PLEURAL SPACE W/IMG GUIDE  04/28/2018  . KNEE ARTHROSCOPY Left 2002  . LYMPH NODE DISSECTION  2005  . MASTECTOMY MODIFIED RADICAL Right 11/05/2016   Procedure: RIGHT MASTECTOMY MODIFIED RADICAL;  Surgeon: Fanny Skates, MD;  Location: [place];  Service: General;  Laterality: Right;  . MODIFIED RADICAL MASTECTOMY Right  11/05/2016  . placement   of port-a-cath    . PLEURAL BIOPSY Right 05/30/2018   Procedure: PLEURAL BIOPSY;  Surgeon: Grace Isaac, MD;  Location: [place];  Service: Thoracic;  Laterality: Right;  . PLEURAL EFFUSION DRAINAGE Right 05/30/2018   Procedure: DRAINAGE OF PLEURAL EFFUSION;  Surgeon: Grace Isaac, MD;  Location: [place];  Service: Thoracic;  Laterality: Right;  . PORT-A-CATH REMOVAL  2006   insertion 2005  . PORT-A-CATH REMOVAL N/A 03/29/2017   Procedure: REMOVAL PORT-A-CATH;  Surgeon: Fanny Skates, MD;  Location: [place];  Service: General;  Laterality: N/A;  . PORTACATH PLACEMENT Right 11/05/2016   Procedure: INSERTION PORT-A-CATH WITH ULTRA SOUND;  Surgeon: Fanny Skates, MD;  Location: [place];  Service: General;  Laterality: Right;  . removal of port a cath  12/2014  . TALC PLEURODESIS Right 05/30/2018   Procedure: Pietro Cassis;  Surgeon: Grace Isaac, MD;  Location: [place];  Service: Thoracic;  Laterality: Right;  . THYROID SURGERY  2009   Ablation   . TOTAL KNEE ARTHROPLASTY Left 02/06/2016   Procedure: TOTAL KNEE ARTHROPLASTY;  Surgeon: Frederik Pear, MD;  Location: [place];  Service: Orthopedics;  Laterality: Left;  . TUBAL LIGATION    . VIDEO ASSISTED THORACOSCOPY Right 05/30/2018   Procedure: VIDEO ASSISTED THORACOSCOPY;  Surgeon: Grace Isaac, MD;  [facility];  Service: Thoracic;  Laterality: Right;    FAMILY HISTORY Family History  Problem Relation Age  of Onset  . Heart attack Brother        Massive MI in 49s  . Stroke Brother        CAD  . Kidney disease Mother   . Stroke Mother   . Diabetes Mother   . Liver disease Sister   . COPD Sister        had I-131 rx of hyperthyroidism  . Diabetes Sister   . Stroke Sister   . Lung cancer Paternal Uncle        hx smoking  . Cancer Cousin   . Non-Hodgkin's lymphoma Cousin 25       cancer x3, in prostate and lung- unsure if met/spread or if primaries  . Heart failure Father   . Heart disease  Father   . Arthritis Father   . Sarcoidosis Sister    The patient's father died at the age of 65 in the setting of Alzheimer's disease. The patient's mother died at the age of 44 from complications of diabetes. The patient has 3 brothers, 4 sisters. There is no history of breast or ovarian cancer in the family area    GYNECOLOGIC HISTORY:  Patient's last menstrual period was 11/06/2003.  menarche age 6, first live birth age 40, the patient is Kayla Price P2. She stopped having periods in 2005, with her chemotherapy; she never took hormone replacement    SOCIAL HISTORY:  Nahomy worked for the [employer] more than 20 years. 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 At home she lives with her son Forestine Na, her daughter [name] who works for [employer] and her granddaughter Donita Brooks, 64 y/o AS OF APRIL 2018     ADVANCED DIRECTIVES:  not in place    HEALTH MAINTENANCE: Social History   Tobacco Use  . Smoking status: Never Smoker  . Smokeless tobacco: Never Used  Substance Use Topics  . Alcohol use: No  . Drug use: No     Colonoscopy:  PAP:  Bone density:   Allergies  Allergen Reactions  . Lisinopril Anaphylaxis and Swelling    Swelling of tongue and mouth 11/05/16- tolerates Olmesartan  . Pepcid [Famotidine] Other (See Comments)    PPI H2, BLOCKERS LOWER GASTRIC PH WHICH WOULD LEAD TO SUBTHERAPEUTIC RILPIVIRINE LEVELS AND POTENTIAL VIROLOGICAL FAILURE WITH RESISTANCE  . Prilosec [Omeprazole] Other (See Comments)    PPI H2, BLOCKERS LOWER GASTRIC PH WHICH WOULD LEAD TO SUBTHERAPEUTIC RILPIVIRINE LEVELS AND POTENTIAL VIROLOGICAL FAILURE WITH RESISTANCE  . Tums [Calcium Carbonate Antacid] Other (See Comments)    TUMS ANTACIDS CAN LOWER GASTRIC PH WHICH COULD  LEAD TO SUBTHERAPEUTIC RILPIVIRINE LEVELS AND POTENTIAL VIROLOGICAL FAILURE WITH RESISTANCE TUMS CAN BE GIVEN BUT NEED CONSULT WITH [facility] PHARMACY RE TIMING. I PREFER HER TO AVOID ALL  TOGETHER    Current Outpatient Medications  Medication Sig Dispense Refill  . CALCIUM CITRATE-VITAMIN D3 PO Take by mouth. 630 mg of calcium and 13 mcg d3, take once daily    . Cholecalciferol (D3 ADULT PO) Take 25 mcg by mouth daily.    . Coenzyme Q10 (COQ10) 100 MG CAPS Take by mouth daily.    Marland Kitchen ELDERBERRY PO Take 50 mg by mouth daily.    Marland Kitchen HYDROcodone-acetaminophen (NORCO/VICODIN) 5-325 MG tablet TAKE ONE TABLET BY MOUTH EVERY 6 HOURS AS NEEDED FOR MODERATE PAIN 60 tablet 0  . hydrocortisone cream 1 % Apply 1 application topically daily as needed for itching.    . JULUCA 50-25 MG  TABS TAKE 1 TABLET BY MOUTH DAILY WITH BREAKFAST. TAKE WITH THE PREZCOBIX. 30 tablet 5  . levothyroxine (SYNTHROID, LEVOTHROID) 175 MCG tablet Take 1 tablet (175 mcg total) by mouth daily before breakfast. 90 tablet 1  . lidocaine-prilocaine (EMLA) cream Apply 1 application topically as needed. Apply to port site 1 hour prior to access 30 g 0  . Loratadine 10 MG CAPS Take by mouth as needed.    . Menthol, Topical Analgesic, (BENGAY EX) Apply 1 application topically daily as needed (muscle pain).    . Methylsulfonylmethane (MSM) 1000 MG TABS Take by mouth daily.    . Naphazoline-Glycerin (REDNESS RELIEF OP) Place 1 drop into both eyes daily as needed (redness).    Marland Kitchen olmesartan-hydrochlorothiazide (BENICAR HCT) 40-25 MG tablet TAKE 1 TABLET BY MOUTH DAILY. 30 tablet 1  . Omega-3 Fatty Acids (FISH OIL) 1000 MG CAPS Take by mouth daily.    . Pitavastatin Calcium 4 MG TABS Take 1 tablet (4 mg total) by mouth daily at 12 noon. This may be placebo, study provided. Do not dispense.    . potassium gluconate 595 (99 K) MG TABS tablet Take 595 mg by mouth daily.    Marland Kitchen PREZCOBIX 800-150 MG tablet TAKE 1 TABLET BY MOUTH DAILY. SWALLOW WHOLE. DO NOT CRUSH, BREAK OR CHEW TABLETS. TAKE WITH FOOD. 30 tablet 5  . Probiotic Product (PROBIOTIC DAILY PO) Take by mouth daily.    Marland Kitchen SELZENTRY 150 MG tablet TAKE ONE TABLET BY MOUTH TWICE  DAILY 60 tablet 5  . tobramycin-dexamethasone (TOBRADEX) ophthalmic solution Place 2 drops into the right eye every 4 (four) hours while awake. 5 mL 0  . Vitamin E 180 MG CAPS Take by mouth daily.     No current facility-administered medications for this visit.     OBJECTIVE: Morbidly obese African-American woman in no acute distress Vitals:   10/24/18 1314  BP: (!) 146/78  Pulse: 87  Resp: 18  Temp: 97.9 F (36.6 C)  SpO2: 98%     Body mass index is 40.64 kg/m.    Filed Weights   10/24/18 1314  Weight: 259 lb 8 oz (117.7 kg)  ECOG FS: 1  Sclerae unicteric, EOMs intact Wearing a mask No cervical or supraclavicular adenopathy Lungs no rales or rhonchi Heart regular rate and rhythm Abd soft, nontender, positive bowel sounds MSK no focal spinal tenderness, no upper extremity lymphedema Neuro: nonfocal, well oriented, appropriate affect Breasts: Deferred      LAB RESULTS:  CMP     Component Value Date/Time   NA 143 10/10/2018 1153   NA 140 05/13/2017 0927   K 3.5 10/10/2018 1153   K 4.1 05/13/2017 0927   CL 106 10/10/2018 1153   CO2 26 10/10/2018 1153   CO2 29 05/13/2017 0927   GLUCOSE 90 10/10/2018 1153   GLUCOSE 102 05/13/2017 0927   BUN 14 10/10/2018 1153   BUN 17.7 05/13/2017 0927   CREATININE 1.41 (H) 10/10/2018 1153   CREATININE 1.62 (H) 09/18/2018 0911   CREATININE 1.4 (H) 05/13/2017 0927   CALCIUM 10.1 10/10/2018 1153   CALCIUM 10.5 (H) 05/13/2017 0927   PROT 8.3 (H) 10/10/2018 1153   PROT 8.1 05/13/2017 0927   ALBUMIN 3.5 10/10/2018 1153   ALBUMIN 3.5 05/13/2017 0927   AST 35 10/10/2018 1153   AST 14 05/13/2017 0927   ALT 14 10/10/2018 1153   ALT 12 05/13/2017 0927   ALKPHOS 76 10/10/2018 1153   ALKPHOS 90 05/13/2017 0927   BILITOT 0.4  10/10/2018 1153   BILITOT 0.27 05/13/2017 0927   GFRNONAA 40 (L) 10/10/2018 1153   GFRNONAA 34 (L) 09/18/2018 0911   GFRAA 46 (L) 10/10/2018 1153   GFRAA 39 (L) 09/18/2018 0911    No results found for:  Ronnald Ramp, A1GS, A2GS, BETS, BETA2SER, GAMS, MSPIKE, SPEI  No results found for: Nils Pyle, [facility]  Lab Results  Component Value Date   WBC 5.3 10/24/2018   NEUTROABS 2.7 10/24/2018   HGB 13.5 10/24/2018   HCT 40.2 10/24/2018   MCV 96.4 10/24/2018   PLT 214 10/24/2018      Chemistry      Component Value Date/Time   NA 143 10/10/2018 1153   NA 140 05/13/2017 0927   K 3.5 10/10/2018 1153   K 4.1 05/13/2017 0927   CL 106 10/10/2018 1153   CO2 26 10/10/2018 1153   CO2 29 05/13/2017 0927   BUN 14 10/10/2018 1153   BUN 17.7 05/13/2017 0927   CREATININE 1.41 (H) 10/10/2018 1153   CREATININE 1.62 (H) 09/18/2018 0911   CREATININE 1.4 (H) 05/13/2017 0927   GLU 104 02/13/2016 1358      Component Value Date/Time   CALCIUM 10.1 10/10/2018 1153   CALCIUM 10.5 (H) 05/13/2017 0927   ALKPHOS 76 10/10/2018 1153   ALKPHOS 90 05/13/2017 0927   AST 35 10/10/2018 1153   AST 14 05/13/2017 0927   ALT 14 10/10/2018 1153   ALT 12 05/13/2017 0927   BILITOT 0.4 10/10/2018 1153   BILITOT 0.27 05/13/2017 0927       Lab Results  Component Value Date   LABCA2 11 04/20/2008    No components found for: [id number]  No results for input(s): INR in the last 168 hours.  Urinalysis    Component Value Date/Time   COLORURINE YELLOW 05/30/2018 1123   APPEARANCEUR CLEAR 05/30/2018 1123   LABSPEC 1.017 05/30/2018 1123   PHURINE 6.0 05/30/2018 1123   GLUCOSEU NEGATIVE 05/30/2018 1123   GLUCOSEU NEG mg/dL 07/23/2006 2113   HGBUR NEGATIVE 05/30/2018 1123   BILIRUBINUR NEGATIVE 05/30/2018 [place] 05/30/2018 [place] 05/30/2018 1123   UROBILINOGEN 1 07/23/2006 2113   NITRITE NEGATIVE 05/30/2018 1123   LEUKOCYTESUR NEGATIVE 05/30/2018 1123     STUDIES: No results found.    ELIGIBLE FOR AVAILABLE RESEARCH PROTOCOL: no   ASSESSMENT: 63 y.o. [place] woman   (1) status post right lumpectomy and sentinel lymph node  sampling October 2005 for a 0.6 cm invasive ductal carcinoma involving one out of 2 sentinel lymph nodes sampled, grade 3, triple-negative, treated adjuvantly with doxorubicin and cyclophosphamide 4 followed by weekly paclitaxel 7, followed by adjuvant radiation  RECURRENT DISEASE: (2) status post right breast upper outer quadrant biopsy and right axillary lymph node biopsy 10/01/2016, both positive for a T2 N1, stage IIIB invasive ductal carcinoma, triple negative, with an MIB-1 of 50-70%  (3) status post right modified radical mastectomy 11/05/2016 showing a pT2 pN1, stage IIIB invasive ductal carcinoma, grade 3, triple negative, with negative margins  (4) Not a candidate for radiation given prior history  (5) adjuvant chemotherapy consisting of carboplatin and gemcitabine given days 1 and 8 of each 21 day cycle, for 6 cycles, starting 11/20/2016, completed 03/11/2017  (a) day 8 cycle 2 omitted because of neutropenia; Neupogen/Neulasta added  (5) HIV positivity  (6) Genetic testing 06/17/2017:  no pathogenic mutations. Genes tested: APC, ATM, AXIN2, BARD1, BLM, BMPR1A, BRCA1, BRCA2, BRIP1, CDH1, CDK4, CDKN2A (p14ARF), CDKN2A (p16INK4a), CEBPA,  CHEK2, CTNNA1, DICER1, EPCAM*, GATA2, GREM1*, HRAS, KIT, MEN1, MLH1, MSH2, MSH3, MSH6, MUTYH, NBN, NF1, PALB2, PDGFRA, PMS2, POLD1, POLE, PTEN, RAD50, RAD51C, RAD51D, RUNX1, SDHB, SDHC, SDHD, SMAD4, SMARCA4, STK11, TERC, TERT, TP53, TSC1, TSC2, VHL. The following genes were evaluated for sequence changes only: HOXB13*, NTHL1*, SDHA.   (a) A variant of uncertain significance (VUS) in a gene called NTHL1 was also noted. c.736G>A (p.Ala246Thr)  METASTATIC DISEASE: November 2019  (1) Patient seen in urgent care and ultimately ED on 10/21 for shortness of breath, chest xray demonstrated Right pleural effusion.   (a) right thoracenteses on 10/21 and 11/4 results show atypical cells, non diagnostic (b) CT chest 04/22/2018 shows re-accumulation of fluid, and  right pleural nodularity. (c) PET scan on 05/01/2018 shows hypermetabolic pleural based metastases, right CP angle nodal metastases, no evidence of malignancy in abdomen and pelvis. (d) bronchoscopy with biopsy by Dr. Lamonte Sakai and BAL on 05/15/2018 was non diagnostic  (e) VAT biopsy of the right pleura 05/30/2018 confirms carcinoma, again triple negative  (f) [facility] shows PD-L1 positive (1% in [facility]); otherwise microsatellite stable, TMB low (5/Mb), no PIK3 mutations; other mutations suggest sensitivity to MTOR inhibitors and several TKIs  (2) Atezolizumab started 07/15/2018 given every other week  (a) PET 08/13/2018 shows tumor Right hemithorax (new baseline study)  (b) Atezo changed to Q4w starting with 11/07/2018 dose  (c) PET 11/28/2018   PLAN:  Shauntia is now just about 6 months out from definitive diagnosis of recurrent metastatic treatment she is receiving is Estate agent.  As far as the treatment is concerned she is having no side effects.  As far as the cancer is concerned she has no cough, pleurisy, worsening shortness of breath, pain, or other obvious symptoms  She will be treated today and then again 2 weeks from now.  On the 11/07/2018 treatment I am going to increase the dose so that we can treat every 4 weeks, which will mean fewer visits here and therefore if your exposures.  I will then see her again on 12/05/2018 and she will have a repeat PET scan before that  She will let me know if she has any other concerns or symptoms prior to that visit  If we do document disease progression we will consider adding a low-dose of eribulin to her current immunotherapy  , Virgie Dad, MD  10/24/18 1:43 PM Medical Oncology and Hematology [facility] [place], [place] [address] Tel. [phone]    Fax. [phone]  I, Jacqualyn Posey am acting as a Education administrator for Chauncey Cruel, MD.   I, Lurline Del MD, have reviewed the above documentation for  accuracy and completeness, and I agree with the above.

## 2018-10-24 ENCOUNTER — Other Ambulatory Visit: Payer: Self-pay

## 2018-10-24 ENCOUNTER — Inpatient Hospital Stay: Payer: Medicare HMO | Attending: Adult Health

## 2018-10-24 ENCOUNTER — Inpatient Hospital Stay: Payer: Medicare HMO

## 2018-10-24 ENCOUNTER — Telehealth: Payer: Self-pay

## 2018-10-24 ENCOUNTER — Inpatient Hospital Stay (HOSPITAL_BASED_OUTPATIENT_CLINIC_OR_DEPARTMENT_OTHER): Payer: Medicare HMO | Admitting: Oncology

## 2018-10-24 VITALS — BP 146/78 | HR 87 | Temp 97.9°F | Resp 18 | Ht 67.0 in | Wt 259.5 lb

## 2018-10-24 DIAGNOSIS — Z809 Family history of malignant neoplasm, unspecified: Secondary | ICD-10-CM | POA: Insufficient documentation

## 2018-10-24 DIAGNOSIS — Z171 Estrogen receptor negative status [ER-]: Secondary | ICD-10-CM

## 2018-10-24 DIAGNOSIS — C782 Secondary malignant neoplasm of pleura: Secondary | ICD-10-CM

## 2018-10-24 DIAGNOSIS — Z853 Personal history of malignant neoplasm of breast: Secondary | ICD-10-CM

## 2018-10-24 DIAGNOSIS — Z95828 Presence of other vascular implants and grafts: Secondary | ICD-10-CM

## 2018-10-24 DIAGNOSIS — C773 Secondary and unspecified malignant neoplasm of axilla and upper limb lymph nodes: Secondary | ICD-10-CM

## 2018-10-24 DIAGNOSIS — N183 Chronic kidney disease, stage 3 (moderate): Secondary | ICD-10-CM | POA: Diagnosis not present

## 2018-10-24 DIAGNOSIS — Z923 Personal history of irradiation: Secondary | ICD-10-CM

## 2018-10-24 DIAGNOSIS — Z833 Family history of diabetes mellitus: Secondary | ICD-10-CM

## 2018-10-24 DIAGNOSIS — E039 Hypothyroidism, unspecified: Secondary | ICD-10-CM | POA: Insufficient documentation

## 2018-10-24 DIAGNOSIS — C50411 Malignant neoplasm of upper-outer quadrant of right female breast: Secondary | ICD-10-CM | POA: Diagnosis not present

## 2018-10-24 DIAGNOSIS — Z21 Asymptomatic human immunodeficiency virus [HIV] infection status: Secondary | ICD-10-CM

## 2018-10-24 DIAGNOSIS — Z9011 Acquired absence of right breast and nipple: Secondary | ICD-10-CM | POA: Insufficient documentation

## 2018-10-24 DIAGNOSIS — Z8249 Family history of ischemic heart disease and other diseases of the circulatory system: Secondary | ICD-10-CM

## 2018-10-24 DIAGNOSIS — Z807 Family history of other malignant neoplasms of lymphoid, hematopoietic and related tissues: Secondary | ICD-10-CM | POA: Diagnosis not present

## 2018-10-24 DIAGNOSIS — Z6841 Body Mass Index (BMI) 40.0 and over, adult: Secondary | ICD-10-CM | POA: Insufficient documentation

## 2018-10-24 DIAGNOSIS — Z801 Family history of malignant neoplasm of trachea, bronchus and lung: Secondary | ICD-10-CM | POA: Diagnosis not present

## 2018-10-24 DIAGNOSIS — C50911 Malignant neoplasm of unspecified site of right female breast: Secondary | ICD-10-CM

## 2018-10-24 DIAGNOSIS — Z8042 Family history of malignant neoplasm of prostate: Secondary | ICD-10-CM | POA: Diagnosis not present

## 2018-10-24 DIAGNOSIS — Z9221 Personal history of antineoplastic chemotherapy: Secondary | ICD-10-CM | POA: Insufficient documentation

## 2018-10-24 DIAGNOSIS — E785 Hyperlipidemia, unspecified: Secondary | ICD-10-CM | POA: Diagnosis not present

## 2018-10-24 DIAGNOSIS — Z5112 Encounter for antineoplastic immunotherapy: Secondary | ICD-10-CM | POA: Diagnosis not present

## 2018-10-24 DIAGNOSIS — M1990 Unspecified osteoarthritis, unspecified site: Secondary | ICD-10-CM | POA: Diagnosis not present

## 2018-10-24 LAB — CMP (CANCER CENTER ONLY)
ALT: 14 U/L (ref 0–44)
AST: 31 U/L (ref 15–41)
Albumin: 3.4 g/dL — ABNORMAL LOW (ref 3.5–5.0)
Alkaline Phosphatase: 60 U/L (ref 38–126)
Anion gap: 7 (ref 5–15)
BUN: 15 mg/dL (ref 8–23)
CO2: 29 mmol/L (ref 22–32)
Calcium: 10.2 mg/dL (ref 8.9–10.3)
Chloride: 102 mmol/L (ref 98–111)
Creatinine: 1.32 mg/dL — ABNORMAL HIGH (ref 0.44–1.00)
GFR, Est AFR Am: 50 mL/min — ABNORMAL LOW (ref >60)
GFR, Estimated: 43 mL/min — ABNORMAL LOW (ref >60)
Glucose, Bld: 76 mg/dL (ref 70–99)
Potassium: 3.8 mmol/L (ref 3.5–5.1)
Sodium: 138 mmol/L (ref 135–145)
Total Bilirubin: 0.4 mg/dL (ref 0.3–1.2)
Total Protein: 8 g/dL (ref 6.5–8.1)

## 2018-10-24 LAB — CBC WITH DIFFERENTIAL (CANCER CENTER ONLY)
Abs Immature Granulocytes: 0.01 10*3/uL (ref 0.00–0.07)
Basophils Absolute: 0.1 10*3/uL (ref 0.0–0.1)
Basophils Relative: 1 %
Eosinophils Absolute: 0.2 10*3/uL (ref 0.0–0.5)
Eosinophils Relative: 4 %
HCT: 40.2 % (ref 36.0–46.0)
Hemoglobin: 13.5 g/dL (ref 12.0–15.0)
Immature Granulocytes: 0 %
Lymphocytes Relative: 32 %
Lymphs Abs: 1.7 10*3/uL (ref 0.7–4.0)
MCH: 32.4 pg (ref 26.0–34.0)
MCHC: 33.6 g/dL (ref 30.0–36.0)
MCV: 96.4 fL (ref 80.0–100.0)
Monocytes Absolute: 0.7 10*3/uL (ref 0.1–1.0)
Monocytes Relative: 12 %
Neutro Abs: 2.7 10*3/uL (ref 1.7–7.7)
Neutrophils Relative %: 51 %
Platelet Count: 214 10*3/uL (ref 150–400)
RBC: 4.17 MIL/uL (ref 3.87–5.11)
RDW: 13.2 % (ref 11.5–15.5)
WBC Count: 5.3 10*3/uL (ref 4.0–10.5)
nRBC: 0 % (ref 0.0–0.2)

## 2018-10-24 LAB — TSH: TSH: 5.577 u[IU]/mL — ABNORMAL HIGH (ref 0.308–3.960)

## 2018-10-24 MED ORDER — PROCHLORPERAZINE MALEATE 10 MG PO TABS
10.0000 mg | ORAL_TABLET | Freq: Once | ORAL | Status: AC
  Administered 2018-10-24: 14:00:00 10 mg via ORAL

## 2018-10-24 MED ORDER — SODIUM CHLORIDE 0.9% FLUSH
10.0000 mL | INTRAVENOUS | Status: DC | PRN
  Administered 2018-10-24: 15:00:00 10 mL
  Filled 2018-10-24: qty 10

## 2018-10-24 MED ORDER — SODIUM CHLORIDE 0.9 % IV SOLN
Freq: Once | INTRAVENOUS | Status: AC
  Administered 2018-10-24: 14:00:00 via INTRAVENOUS
  Filled 2018-10-24: qty 250

## 2018-10-24 MED ORDER — SODIUM CHLORIDE 0.9% FLUSH
10.0000 mL | Freq: Once | INTRAVENOUS | Status: AC
  Administered 2018-10-24: 10 mL
  Filled 2018-10-24: qty 10

## 2018-10-24 MED ORDER — SODIUM CHLORIDE 0.9 % IV SOLN
840.0000 mg | Freq: Once | INTRAVENOUS | Status: AC
  Administered 2018-10-24: 15:00:00 840 mg via INTRAVENOUS
  Filled 2018-10-24: qty 14

## 2018-10-24 MED ORDER — HEPARIN SOD (PORK) LOCK FLUSH 100 UNIT/ML IV SOLN
500.0000 [IU] | Freq: Once | INTRAVENOUS | Status: AC | PRN
  Administered 2018-10-24: 15:00:00 500 [IU]
  Filled 2018-10-24: qty 5

## 2018-10-24 MED ORDER — PROCHLORPERAZINE MALEATE 10 MG PO TABS
ORAL_TABLET | ORAL | Status: AC
  Filled 2018-10-24: qty 1

## 2018-10-24 NOTE — Patient Instructions (Signed)
[facility] Discharge Instructions for Patients Receiving Chemotherapy  Today you received the following chemotherapy agents Tecentriq. To help prevent nausea and vomiting after your treatment, we encourage you to take your nausea medication.   If you develop nausea and vomiting that is not controlled by your nausea medication, call the clinic.   BELOW ARE SYMPTOMS THAT SHOULD BE REPORTED IMMEDIATELY:  *FEVER GREATER THAN 100.5 F  *CHILLS WITH OR WITHOUT FEVER  NAUSEA AND VOMITING THAT IS NOT CONTROLLED WITH YOUR NAUSEA MEDICATION  *UNUSUAL SHORTNESS OF BREATH  *UNUSUAL BRUISING OR BLEEDING  TENDERNESS IN MOUTH AND THROAT WITH OR WITHOUT PRESENCE OF ULCERS  *URINARY PROBLEMS  *BOWEL PROBLEMS  UNUSUAL RASH Items with * indicate a potential emergency and should be followed up as soon as possible.  Feel free to call the clinic should you have any questions or concerns. The clinic phone number is [phone].  Please show the [place] at check-in to the Emergency Department and triage nurse.

## 2018-10-24 NOTE — Addendum Note (Signed)
Addended by: Chauncey Cruel on: 10/24/2018 01:47 PM   Modules accepted: Orders

## 2018-10-24 NOTE — Telephone Encounter (Signed)
LOV 10/09/18. Per Dr. Loanne Drilling, advised to f/u in 3 months. No current appt noted. LVM requesting returned call.

## 2018-10-24 NOTE — Patient Instructions (Signed)

## 2018-10-25 LAB — T4: T4, Total: 11.3 ug/dL (ref 4.5–12.0)

## 2018-10-27 ENCOUNTER — Telehealth: Payer: Self-pay | Admitting: Oncology

## 2018-10-27 NOTE — Telephone Encounter (Signed)
Tried to reach regarding schedule °

## 2018-10-28 ENCOUNTER — Other Ambulatory Visit: Payer: Self-pay | Admitting: Oncology

## 2018-10-28 DIAGNOSIS — Z1231 Encounter for screening mammogram for malignant neoplasm of breast: Secondary | ICD-10-CM

## 2018-10-29 MED FILL — OLMESARTAN-HCTZ 40-25 MG TA: 40-25 | 30 days supply | Qty: 30 | Fill #0

## 2018-11-04 DIAGNOSIS — C50911 Malignant neoplasm of unspecified site of right female breast: Secondary | ICD-10-CM | POA: Diagnosis not present

## 2018-11-07 ENCOUNTER — Other Ambulatory Visit: Payer: Self-pay

## 2018-11-07 ENCOUNTER — Inpatient Hospital Stay: Payer: Medicare HMO

## 2018-11-07 VITALS — BP 109/74 | HR 81 | Temp 98.0°F | Resp 18

## 2018-11-07 DIAGNOSIS — C50411 Malignant neoplasm of upper-outer quadrant of right female breast: Secondary | ICD-10-CM

## 2018-11-07 DIAGNOSIS — Z5112 Encounter for antineoplastic immunotherapy: Secondary | ICD-10-CM | POA: Diagnosis not present

## 2018-11-07 DIAGNOSIS — E785 Hyperlipidemia, unspecified: Secondary | ICD-10-CM | POA: Diagnosis not present

## 2018-11-07 DIAGNOSIS — Z171 Estrogen receptor negative status [ER-]: Secondary | ICD-10-CM

## 2018-11-07 DIAGNOSIS — M1990 Unspecified osteoarthritis, unspecified site: Secondary | ICD-10-CM | POA: Diagnosis not present

## 2018-11-07 DIAGNOSIS — C50911 Malignant neoplasm of unspecified site of right female breast: Secondary | ICD-10-CM

## 2018-11-07 DIAGNOSIS — Z95828 Presence of other vascular implants and grafts: Secondary | ICD-10-CM

## 2018-11-07 DIAGNOSIS — E039 Hypothyroidism, unspecified: Secondary | ICD-10-CM | POA: Diagnosis not present

## 2018-11-07 DIAGNOSIS — C782 Secondary malignant neoplasm of pleura: Secondary | ICD-10-CM | POA: Diagnosis not present

## 2018-11-07 DIAGNOSIS — C773 Secondary and unspecified malignant neoplasm of axilla and upper limb lymph nodes: Secondary | ICD-10-CM | POA: Diagnosis not present

## 2018-11-07 DIAGNOSIS — N183 Chronic kidney disease, stage 3 (moderate): Secondary | ICD-10-CM | POA: Diagnosis not present

## 2018-11-07 LAB — COMPREHENSIVE METABOLIC PANEL
ALT: 13 U/L (ref 0–44)
AST: 27 U/L (ref 15–41)
Albumin: 3.6 g/dL (ref 3.5–5.0)
Alkaline Phosphatase: 66 U/L (ref 38–126)
Anion gap: 9 (ref 5–15)
BUN: 18 mg/dL (ref 8–23)
CO2: 28 mmol/L (ref 22–32)
Calcium: 10.5 mg/dL — ABNORMAL HIGH (ref 8.9–10.3)
Chloride: 103 mmol/L (ref 98–111)
Creatinine, Ser: 1.45 mg/dL — ABNORMAL HIGH (ref 0.44–1.00)
GFR calc Af Amer: 45 mL/min — ABNORMAL LOW (ref >60)
GFR calc non Af Amer: 38 mL/min — ABNORMAL LOW (ref >60)
Glucose, Bld: 86 mg/dL (ref 70–99)
Potassium: 3.8 mmol/L (ref 3.5–5.1)
Sodium: 140 mmol/L (ref 135–145)
Total Bilirubin: 0.5 mg/dL (ref 0.3–1.2)
Total Protein: 8.4 g/dL — ABNORMAL HIGH (ref 6.5–8.1)

## 2018-11-07 LAB — CBC WITH DIFFERENTIAL/PLATELET
Abs Immature Granulocytes: 0.01 10*3/uL (ref 0.00–0.07)
Basophils Absolute: 0.1 10*3/uL (ref 0.0–0.1)
Basophils Relative: 1 %
Eosinophils Absolute: 0.2 10*3/uL (ref 0.0–0.5)
Eosinophils Relative: 4 %
HCT: 42.3 % (ref 36.0–46.0)
Hemoglobin: 13.7 g/dL (ref 12.0–15.0)
Immature Granulocytes: 0 %
Lymphocytes Relative: 27 %
Lymphs Abs: 1.6 10*3/uL (ref 0.7–4.0)
MCH: 31.5 pg (ref 26.0–34.0)
MCHC: 32.4 g/dL (ref 30.0–36.0)
MCV: 97.2 fL (ref 80.0–100.0)
Monocytes Absolute: 0.8 10*3/uL (ref 0.1–1.0)
Monocytes Relative: 13 %
Neutro Abs: 3.3 10*3/uL (ref 1.7–7.7)
Neutrophils Relative %: 55 %
Platelets: 214 10*3/uL (ref 150–400)
RBC: 4.35 MIL/uL (ref 3.87–5.11)
RDW: 13.1 % (ref 11.5–15.5)
WBC: 5.9 10*3/uL (ref 4.0–10.5)
nRBC: 0 % (ref 0.0–0.2)

## 2018-11-07 LAB — TSH: TSH: 5.667 u[IU]/mL — ABNORMAL HIGH (ref 0.308–3.960)

## 2018-11-07 MED ORDER — SODIUM CHLORIDE 0.9% FLUSH
10.0000 mL | Freq: Once | INTRAVENOUS | Status: AC
  Administered 2018-11-07: 10 mL
  Filled 2018-11-07: qty 10

## 2018-11-07 MED ORDER — SODIUM CHLORIDE 0.9% FLUSH
10.0000 mL | INTRAVENOUS | Status: DC | PRN
  Administered 2018-11-07: 10 mL
  Filled 2018-11-07: qty 10

## 2018-11-07 MED ORDER — SODIUM CHLORIDE 0.9 % IV SOLN
1680.0000 mg | Freq: Once | INTRAVENOUS | Status: AC
  Administered 2018-11-07: 14:00:00 1680 mg via INTRAVENOUS
  Filled 2018-11-07: qty 28

## 2018-11-07 MED ORDER — SODIUM CHLORIDE 0.9 % IV SOLN
Freq: Once | INTRAVENOUS | Status: AC
  Administered 2018-11-07: 13:00:00 via INTRAVENOUS
  Filled 2018-11-07: qty 250

## 2018-11-07 MED ORDER — HEPARIN SOD (PORK) LOCK FLUSH 100 UNIT/ML IV SOLN
500.0000 [IU] | Freq: Once | INTRAVENOUS | Status: AC | PRN
  Administered 2018-11-07: 500 [IU]
  Filled 2018-11-07: qty 5

## 2018-11-07 MED ORDER — PROCHLORPERAZINE MALEATE 10 MG PO TABS
10.0000 mg | ORAL_TABLET | Freq: Once | ORAL | Status: AC
  Administered 2018-11-07: 10 mg via ORAL

## 2018-11-07 MED ORDER — PROCHLORPERAZINE MALEATE 10 MG PO TABS
ORAL_TABLET | ORAL | Status: AC
  Filled 2018-11-07: qty 1

## 2018-11-07 NOTE — Patient Instructions (Signed)
[facility] Discharge Instructions for Patients Receiving Chemotherapy  Today you received the following chemotherapy agents Tecentriq. To help prevent nausea and vomiting after your treatment, we encourage you to take your nausea medication.   If you develop nausea and vomiting that is not controlled by your nausea medication, call the clinic.   BELOW ARE SYMPTOMS THAT SHOULD BE REPORTED IMMEDIATELY:  *FEVER GREATER THAN 100.5 F  *CHILLS WITH OR WITHOUT FEVER  NAUSEA AND VOMITING THAT IS NOT CONTROLLED WITH YOUR NAUSEA MEDICATION  *UNUSUAL SHORTNESS OF BREATH  *UNUSUAL BRUISING OR BLEEDING  TENDERNESS IN MOUTH AND THROAT WITH OR WITHOUT PRESENCE OF ULCERS  *URINARY PROBLEMS  *BOWEL PROBLEMS  UNUSUAL RASH Items with * indicate a potential emergency and should be followed up as soon as possible.  Feel free to call the clinic should you have any questions or concerns. The clinic phone number is [phone].  Please show the [place] at check-in to the Emergency Department and triage nurse.

## 2018-11-08 LAB — CANCER ANTIGEN 27.29: CA 27.29: 39.2 U/mL — ABNORMAL HIGH (ref 0.0–38.6)

## 2018-11-08 LAB — T4: T4, Total: 10.7 ug/dL (ref 4.5–12.0)

## 2018-11-10 ENCOUNTER — Encounter (INDEPENDENT_AMBULATORY_CARE_PROVIDER_SITE_OTHER): Payer: Self-pay | Admitting: *Deleted

## 2018-11-10 ENCOUNTER — Other Ambulatory Visit: Payer: Self-pay

## 2018-11-10 ENCOUNTER — Other Ambulatory Visit: Payer: Self-pay | Admitting: Nurse Practitioner

## 2018-11-10 DIAGNOSIS — Z006 Encounter for examination for normal comparison and control in clinical research program: Secondary | ICD-10-CM

## 2018-11-10 DIAGNOSIS — M1990 Unspecified osteoarthritis, unspecified site: Secondary | ICD-10-CM

## 2018-11-10 MED ORDER — STUDY - REPRIEVE A5332 - PITAVASTATIN 4MG OR PLACEBO TABLET (PI-VAN DAM): 4.0000 mg | ORAL_TABLET | Freq: Every day | ORAL | Status: DC

## 2018-11-10 MED FILL — HYDROCODON-APAP 5-325: 5-325 | 15 days supply | Qty: 60 | Fill #0

## 2018-11-10 NOTE — Research (Signed)
Kayla Price was here today for her month 105 visit for [employer], A Randomized Trial to Prevent Vascular Events in HIV (study drug is Pitavastatin 4mg  or placebo). She continues her chemo treatments for breast cancer. She recently had a PET scan which showed a tumor in her rt lung area, which she says has not caused her many symptoms except for some fatigue and decreased eating due to feeling full easily. She has been very adherent with her study meds and will be returning in September for the next visit.

## 2018-11-10 NOTE — Telephone Encounter (Signed)
Last filled 10/07/2018  [facility] Database verified and compliance confirmed

## 2018-11-12 ENCOUNTER — Telehealth: Payer: Self-pay | Admitting: Physical Therapy

## 2018-11-12 NOTE — Telephone Encounter (Signed)
The [facility] office contacted me regarding this patient because the patient reached out to [facility] requesting they pay for a new compression sleeve. I called the patient to determine if she should return to therapy instead of getting a new sleeve. She reported having right arm and upper back pain with increased swelling since she had a procedure done for pleural effusion in 12/19. We talked about the risk of her using a heating pad on her arm which she reported doing regularly. Educated pt that this could cause increased edema but may be worth the risk as it is allowing her to take less pain medication but that she should monitor her arm for increased swelling as a result of using heat. We talked about her coming in for therapy but the PT advised her to wait as she is immunocompromised for various reasons.  It sounds like she would benefit from a new compression sleeve and [facility] would pay for that. Encouraged her to call [facility] and make an appointment. PT will ask nurse navigator to fax an order for her compression sleeve to [facility] and I will send an approval form. Annia Friendly, [place] 11/12/18 1:52 PM

## 2018-11-13 MED FILL — SELZENTRY 150 MG TABS: 150 | 30 days supply | Qty: 60 | Fill #4

## 2018-11-15 MED FILL — JULUCA 50-25 MG TAB: 50-25 | 30 days supply | Qty: 30 | Fill #1

## 2018-11-15 MED FILL — PREZCOBIX 800 MG-150 MG TAB: 800-150 | 30 days supply | Qty: 30 | Fill #1

## 2018-11-24 DIAGNOSIS — Z9221 Personal history of antineoplastic chemotherapy: Secondary | ICD-10-CM

## 2018-11-24 HISTORY — DX: Personal history of antineoplastic chemotherapy: Z92.21

## 2018-11-28 ENCOUNTER — Other Ambulatory Visit: Payer: Self-pay

## 2018-11-28 ENCOUNTER — Ambulatory Visit (HOSPITAL_COMMUNITY)
Admission: RE | Admit: 2018-11-28 | Discharge: 2018-11-28 | Disposition: A | Discharge status: Home / Self Care | Payer: Medicare HMO | Source: Ambulatory Visit | Attending: Oncology | Admitting: Oncology

## 2018-11-28 DIAGNOSIS — C50911 Malignant neoplasm of unspecified site of right female breast: Secondary | ICD-10-CM | POA: Diagnosis not present

## 2018-11-28 DIAGNOSIS — C50411 Malignant neoplasm of upper-outer quadrant of right female breast: Secondary | ICD-10-CM | POA: Diagnosis not present

## 2018-11-28 DIAGNOSIS — Z171 Estrogen receptor negative status [ER-]: Secondary | ICD-10-CM | POA: Diagnosis not present

## 2018-11-28 DIAGNOSIS — R918 Other nonspecific abnormal finding of lung field: Secondary | ICD-10-CM | POA: Diagnosis not present

## 2018-11-28 DIAGNOSIS — C779 Secondary and unspecified malignant neoplasm of lymph node, unspecified: Secondary | ICD-10-CM | POA: Diagnosis not present

## 2018-11-28 DIAGNOSIS — Z79899 Other long term (current) drug therapy: Secondary | ICD-10-CM | POA: Insufficient documentation

## 2018-11-28 LAB — GLUCOSE, CAPILLARY: Glucose-Capillary: 93 mg/dL (ref 70–99)

## 2018-11-28 MED ORDER — FLUDEOXYGLUCOSE F - 18 (FDG) INJECTION
12.7000 | Freq: Once | INTRAVENOUS | Status: AC | PRN
  Administered 2018-11-28: 12.7 via INTRAVENOUS

## 2018-12-01 ENCOUNTER — Other Ambulatory Visit: Payer: Self-pay | Admitting: Oncology

## 2018-12-04 ENCOUNTER — Other Ambulatory Visit: Payer: Self-pay | Admitting: Nurse Practitioner

## 2018-12-04 DIAGNOSIS — M1990 Unspecified osteoarthritis, unspecified site: Secondary | ICD-10-CM

## 2018-12-04 NOTE — Progress Notes (Signed)
cancer or not.  She is going to  [facility]  [phone]) [phone] [phone]    ID: Kayla Price DOB: 1955-09-17  MR#: [id number]  JYN#:[id number]  Patient Care Team: Sharon Seller, NP as PCP - General (Nurse Practitioner) Daiva Eves, Lisette Grinder, MD as PCP - Infectious Diseases (Infectious Diseases) Ernesto Rutherford, MD as Consulting Physician (Ophthalmology) Romero Belling, MD as Consulting Physician (Endocrinology) Gean Birchwood, MD as Consulting Physician (Orthopedic Surgery) Aadhira Heffernan, Valentino Hue, MD as Consulting Physician (Oncology) Claud Kelp, MD as Consulting Physician (General Surgery) Reva Bores, MD as Consulting Physician (Obstetrics and Gynecology) Axel Filler, Larna Daughters, NP as Nurse Practitioner (Hematology and Oncology) Glenford Peers, OD as Referring Physician (Optometry) OTHER MD:   CHIEF COMPLAINT: Triple negative breast cancer, recurrent  CURRENT TREATMENT: Atezolizumab, eribulin   INTERVAL HISTORY: Kayla Price was seen today for follow-up and treatment of her recurrent triple negative breast cancer.  She continues on atezolizumab, received every 14 days. She tolerates this well and without any noticeable side effects.    However her PET scan 11/28/2018 documented evidence of disease progression with increase in the rind of hypermetabolic pleural thickening in the right hemithorax, increase in the right middle lobe mass now measuring 5 cm, an increase in the hypermetabolic nodal metastases in the subcarinal right paratracheal and right supraclavicular areas.  There was a possible solitary skeletal metastasis in the right pedicle of T1.  We have also been following her CA-27-29.  Results for Kayla, Price (MRN [id number]) as of 12/04/2018 22:10  Ref. Range 08/15/2018 08:27 09/12/2018 10:33 09/26/2018 12:52 10/10/2018 11:53 11/07/2018 12:27  CA 27.29 Latest Ref Range: 0.0 - 38.6 U/mL 21.5 29.5 32.0 39.5 (H) 39.2 (H)   Kayla Price is  here today to discuss these results.  REVIEW OF SYSTEMS: Tongela notes that she has been having some pain, which is a constant ache that is affecting her sleep. She is also having some . She has had some shortness of breath, but she has been doing breathing exercises to improve her breathing. She has also had some mild cough, but it is not persistent or constant. She got a custom order compression sleeve. She notes that she is losing weight and that she is getting full quicker than before.  She has been drinking nutritional meal replacement shakes. She has some constipation. She is not taking pain medication during the day for pain, but at night she is taking Norco. She has some neuropathy, but not like it was before; she states that it is the least of her concerns. The patient denies unusual headaches, visual changes, nausea, vomiting, or dizziness. This been no change in bladder habits. The patient denies unexplained fatigue, bleeding, rash, or fever. A detailed review of systems was otherwise noncontributory.   Wt Readings from Last 3 Encounters:  12/05/18 249 lb 9.6 oz (113.2 kg)  10/24/18 259 lb 8 oz (117.7 kg)  10/10/18 264 lb (119.7 kg)    BREAST CANCER HISTORY: From the original intake note:  Jurie is a history of right-sided breast cancer dating back to 2005. She had a lumpectomy with sentinel lymph node sampling, chemotherapy, and radiation. I do not have access to those records at present.  More recently [facility] showing a possible mass in the right breast. Diagnostic mammography with ultrasonography on 09/28/2016 the patient underwent right diagnostic mammography with tomography and right breast ultrasonography. The breast density was category A. In the right breast  at the 10:00 position there was an irregular mass measuring 2.5 cm. Ultrasound identified this the 10:00 radiant 10 cm from the nipple measuring 2.4 cm. In the  right axilla there was an abnormal lymph node measuring 1.3 cm with other normal-appearing lymph nodes.  On 10/01/2016 she  underwent biopsy of the right breast mass in question as well as the suspicious axillary lymph node. 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 The proliferation marker was 70% in the breast lesion and 50% in the lymph node.  Her subsequent history is as detailed below.    PAST MEDICAL HISTORY: Past Medical History:  Diagnosis Date   Alopecia areata 11/28/2009   Bell's palsy    Cancer (HCC) 2005   Breast cancer   chemotherapy and radiation   CKD (chronic kidney disease) stage 3, GFR 30-59 ml/min (HCC) 06/08/2015   Dry eye syndrome    Family history of lung cancer    Family history of non-Hodgkin's lymphoma    Fasting hyperglycemia    Gestational diabetes    2001   HIP FRACTURE, RIGHT 05/06/2008   History of kidney stones    HIV DISEASE 03/27/2006   HYPERLIPIDEMIA, MIXED 12/15/2007   HYPERTENSION 03/27/2006   HYPOTHYROIDISM, POST-RADIATION 06/28/2008   MENORRHAGIA, POSTMENOPAUSAL 02/03/2009   Osteoarthritis of left knee 06/08/2015   OSTEOARTHROSIS, LOCAL, SCND, UNSPC SITE 04/07/2007   PVD 04/07/2007   Tinea capitis    TRIGGER FINGER 05/06/2008   Unspecified vitamin D deficiency 08/06/2007    PAST SURGICAL HISTORY: Past Surgical History:  Procedure Laterality Date   BREAST LUMPECTOMY Right    2005   CHEST TUBE INSERTION Right 05/30/2018   Procedure: INSERTION PLEURAL DRAINAGE CATHETER;  Surgeon: Delight Ovens, MD;  Location: [facility] OR;  Service: Thoracic;  Laterality: Right;   COLONOSCOPY     COLONOSCOPY WITH ESOPHAGOGASTRODUODENOSCOPY (EGD)     ENDOBRONCHIAL ULTRASOUND Bilateral 05/15/2018   Procedure: ENDOBRONCHIAL ULTRASOUND;  Surgeon: Leslye Peer, MD;  Location: [facility];   Service: Cardiopulmonary;  Laterality: Bilateral;   FINE NEEDLE ASPIRATION BIOPSY  05/15/2018   Procedure: FINE NEEDLE ASPIRATION BIOPSY;  Surgeon: Leslye Peer, MD;  Location: [facility];  Service: Cardiopulmonary;;   FLEXIBLE BRONCHOSCOPY  05/15/2018   Procedure: FLEXIBLE BRONCHOSCOPY;  Surgeon: Leslye Peer, MD;  Location: [facility];  Service: Cardiopulmonary;;   HYSTEROSCOPY  2006   IR IMAGING GUIDED PORT INSERTION  07/14/2018   IR THORACENTESIS ASP PLEURAL SPACE W/IMG GUIDE  04/14/2018   IR THORACENTESIS ASP PLEURAL SPACE W/IMG GUIDE  04/28/2018   KNEE ARTHROSCOPY Left 2002   LYMPH NODE DISSECTION  2005   MASTECTOMY MODIFIED RADICAL Right 11/05/2016   Procedure: RIGHT MASTECTOMY MODIFIED RADICAL;  Surgeon: Claud Kelp, MD;  [facility];  Service: General;  Laterality: Right;   MODIFIED RADICAL MASTECTOMY Right 11/05/2016   placement   of port-a-cath     PLEURAL BIOPSY Right 05/30/2018   Procedure: PLEURAL BIOPSY;  Surgeon: Delight Ovens, MD;  Location: [facility] OR;  Service: Thoracic;  Laterality: Right;   PLEURAL EFFUSION DRAINAGE Right 05/30/2018   Procedure: DRAINAGE OF PLEURAL EFFUSION;  Surgeon: Delight Ovens, MD;  Location: [facility] OR;  Service: Thoracic;  Laterality: Right;   PORT-A-CATH REMOVAL  2006   insertion 2005   PORT-A-CATH REMOVAL N/A 03/29/2017   Procedure: REMOVAL PORT-A-CATH;  Surgeon: Claud Kelp, MD;  Location: [facility] OR;  Service: General;  Laterality: N/A;   PORTACATH PLACEMENT Right 11/05/2016   Procedure: INSERTION PORT-A-CATH WITH ULTRA SOUND;  Surgeon: Claud Kelp, MD;  Location: [facility] OR;  Service: General;  Laterality: Right;   removal of port a cath  12/2014   TALC PLEURODESIS Right 05/30/2018   Procedure: Lurlean Nanny;  Surgeon: Delight Ovens, MD;  Location: [facility] OR;  Service: Thoracic;  Laterality: Right;   THYROID SURGERY  2009   Ablation    TOTAL KNEE ARTHROPLASTY Left 02/06/2016   Procedure: TOTAL KNEE  ARTHROPLASTY;  Surgeon: Gean Birchwood, MD;  Location: [facility] OR;  Service: Orthopedics;  Laterality: Left;   TUBAL LIGATION     VIDEO ASSISTED THORACOSCOPY Right 05/30/2018   Procedure: VIDEO ASSISTED THORACOSCOPY;  Surgeon: Delight Ovens, MD;  Location: [facility] OR;  Service: Thoracic;  Laterality: Right;    FAMILY HISTORY Family History  Problem Relation Age of Onset   Heart attack Brother        Massive MI in 39s   Stroke Brother        CAD   Kidney disease Mother    Stroke Mother    Diabetes Mother    Liver disease Sister    COPD Sister        had I-131 rx of hyperthyroidism   Diabetes Sister    Stroke Sister    Lung cancer Paternal Uncle        hx smoking   Cancer Cousin    Non-Hodgkin's lymphoma Cousin 25       cancer x3, in prostate and lung- unsure if met/spread or if primaries   Heart failure Father    Heart disease Father    Arthritis Father    Sarcoidosis Sister    The patient's father died at the age of 41 in the setting of Alzheimer's disease. The patient's mother died at the age of 12 from complications of diabetes. The patient has 3 brothers, 4 sisters. There is no history of breast or ovarian cancer in the family area    GYNECOLOGIC HISTORY:  Patient's last menstrual period was 11/06/2003.  menarche age 49, first live birth age 38, the patient is GX P2. She stopped having periods in 2005, with her chemotherapy; she never took hormone replacement    SOCIAL HISTORY:  Kayla Price worked for the [employer] more than 20 years. 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 At home she lives with her son Kayla Price, her daughter Kayla Price who works for [employer] and her granddaughter Kayla Price, 75 y/o AS OF APRIL 2018     ADVANCED DIRECTIVES:  not in place    HEALTH MAINTENANCE: Social History   Tobacco Use   Smoking status: Never Smoker   Smokeless tobacco: Never Used  Substance Use Topics   Alcohol  use: No   Drug use: No     Colonoscopy:  PAP:  Bone density:   Allergies  Allergen Reactions   Lisinopril Anaphylaxis and Swelling    Swelling of tongue and mouth 11/05/16- tolerates Olmesartan   Pepcid [Famotidine] Other (See Comments)    PPI H2, BLOCKERS LOWER GASTRIC PH WHICH WOULD LEAD TO SUBTHERAPEUTIC RILPIVIRINE LEVELS AND POTENTIAL VIROLOGICAL FAILURE WITH RESISTANCE   Prilosec [Omeprazole] Other (See Comments)    PPI H2, BLOCKERS LOWER GASTRIC PH WHICH WOULD LEAD TO SUBTHERAPEUTIC RILPIVIRINE LEVELS AND POTENTIAL VIROLOGICAL FAILURE WITH RESISTANCE   Tums [Calcium Carbonate Antacid] Other (See Comments)    TUMS ANTACIDS CAN  LOWER GASTRIC PH WHICH COULD  LEAD TO SUBTHERAPEUTIC RILPIVIRINE LEVELS AND POTENTIAL VIROLOGICAL FAILURE WITH RESISTANCE TUMS CAN BE GIVEN BUT NEED CONSULT WITH [facility]ARM[facility] RE TIMING. I PREFER HER TO AVOID ALL TOGETHER    Current Outpatient Medications  Medication Sig Dispense Refill   CALCIUM CITRATE-VITAMIN D3 PO Take by mouth. 630 mg of calcium and 13 mcg d3, take once daily     Cholecalciferol (D3 ADULT PO) Take 25 mcg by mouth daily.     Coenzyme Q10 (COQ10) 100 MG CAPS Take by mouth daily.     ELDERBERRY PO Take 50 mg by mouth daily.     HYDROcodone-acetaminophen (NORCO/VICODIN) 5-325 MG tablet TAKE ONE TABLET BY MOUTH EVERY 6 HOURS AS NEEDED FOR MODERATE PAIN 60 tablet 0   hydrocortisone cream 1 % Apply 1 application topically daily as needed for itching.     JULUCA 50-25 MG TABS TAKE 1 TABLET BY MOUTH DAILY WITH BREAKFAST. TAKE WITH THE PREZCOBIX. 30 tablet 5   levothyroxine (SYNTHROID, LEVOTHROID) 175 MCG tablet Take 1 tablet (175 mcg total) by mouth daily before breakfast. 90 tablet 1   lidocaine-prilocaine (EMLA) cream Apply 1 application topically as needed. Apply to port site 1 hour prior to access 30 g 0   Loratadine 10 MG CAPS Take by mouth as needed.     Menthol, Topical Analgesic, (BENGAY EX) Apply 1 application topically  daily as needed (muscle pain).     Methylsulfonylmethane (MSM) 1000 MG TABS Take by mouth daily.     Naphazoline-Glycerin (REDNESS RELIEF OP) Place 1 drop into both eyes daily as needed (redness).     olmesartan-hydrochlorothiazide (BENICAR HCT) 40-25 MG tablet TAKE 1 TABLET BY MOUTH DAILY. 30 tablet 1   Omega-3 Fatty Acids (FISH OIL) 1000 MG CAPS Take by mouth daily.     potassium gluconate 595 (99 K) MG TABS tablet Take 595 mg by mouth daily.     PREZCOBIX 800-150 MG tablet TAKE 1 TABLET BY MOUTH DAILY. SWALLOW WHOLE. DO NOT CRUSH, BREAK OR CHEW TABLETS. TAKE WITH FOOD. 30 tablet 5   Probiotic Product (PROBIOTIC DAILY PO) Take by mouth daily.     SELZENTRY 150 MG tablet TAKE ONE TABLET BY MOUTH TWICE DAILY 60 tablet 5   Study - REPRIEVE [phone] - pitavastatin 4 mg or placebo tablet (PI-[place]) Take 1 tablet (4 mg total) by mouth daily. 30 tablet    tobramycin-dexamethasone (TOBRADEX) ophthalmic solution Place 2 drops into the right eye every 4 (four) hours while awake. 5 mL 0   Vitamin E 180 MG CAPS Take by mouth daily.     No current facility-administered medications for this visit.    Facility-Administered Medications Ordered in Other Visits  Medication Dose Route Frequency Provider Last Rate Last Dose   acetaminophen (TYLENOL) tablet 650 mg  650 mg Oral Once Rogen Porte, Valentino Hue, MD       heparin lock flush 100 unit/mL  500 Units Intracatheter Once PRN Cathryn Gallery, Valentino Hue, MD       sodium chloride flush (NS) 0.9 % injection 10 mL  10 mL Intracatheter PRN Donavin Audino, Valentino Hue, MD        OBJECTIVE: Morbidly obese African-American woman who appears stated age Vitals:   12/05/18 0942  BP: 123/68  Pulse: 94  Resp: 17  Temp: 98.3 F (36.8 C)  SpO2: 100%     Body mass index is 39.09 kg/m.    Filed Weights   12/05/18 0942  Weight: 249 lb 9.6 oz (  113.2 kg)  ECOG FS: 2  Sclerae unicteric, pupils round and equal Wearing a mask Lungs no rales or rhonchi Heart regular rate  and rhythm Abd soft, obese, nontender, positive bowel sounds Neuro: nonfocal, well oriented, appropriate affect Breasts: Status post right mastectomy with no evidence of chest wall recurrence.  Left breast is benign.  Both axillae are benign.  LAB RESULTS:  CMP     Component Value Date/Time   NA 141 12/05/2018 0912   NA 140 05/13/2017 0927   K 3.7 12/05/2018 0912   K 4.1 05/13/2017 0927   CL 102 12/05/2018 0912   CO2 28 12/05/2018 0912   CO2 29 05/13/2017 0927   GLUCOSE 100 (H) 12/05/2018 0912   GLUCOSE 102 05/13/2017 0927   BUN 16 12/05/2018 0912   BUN 17.7 05/13/2017 0927   CREATININE 1.52 (H) 12/05/2018 0912   CREATININE 1.62 (H) 09/18/2018 0911   CREATININE 1.4 (H) 05/13/2017 0927   CALCIUM 10.5 (H) 12/05/2018 0912   CALCIUM 10.5 (H) 05/13/2017 0927   PROT 8.3 (H) 12/05/2018 0912   PROT 8.1 05/13/2017 0927   ALBUMIN 3.5 12/05/2018 0912   ALBUMIN 3.5 05/13/2017 0927   AST 27 12/05/2018 0912   AST 14 05/13/2017 0927   ALT 14 12/05/2018 0912   ALT 12 05/13/2017 0927   ALKPHOS 61 12/05/2018 0912   ALKPHOS 90 05/13/2017 0927   BILITOT 0.5 12/05/2018 0912   BILITOT 0.27 05/13/2017 0927   GFRNONAA 36 (L) 12/05/2018 0912   GFRNONAA 34 (L) 09/18/2018 0911   GFRAA 42 (L) 12/05/2018 0912   GFRAA 39 (L) 09/18/2018 0911    No results found for: Dorene Ar, A1GS, A2GS, BETS, BETA2SER, GAMS, MSPIKE, SPEI  No results found for: Ron Parker, [facility]  Lab Results  Component Value Date   WBC 5.3 12/05/2018   NEUTROABS 3.3 12/05/2018   HGB 13.2 12/05/2018   HCT 40.6 12/05/2018   MCV 98.8 12/05/2018   PLT 212 12/05/2018      Chemistry      Component Value Date/Time   NA 141 12/05/2018 0912   NA 140 05/13/2017 0927   K 3.7 12/05/2018 0912   K 4.1 05/13/2017 0927   CL 102 12/05/2018 0912   CO2 28 12/05/2018 0912   CO2 29 05/13/2017 0927   BUN 16 12/05/2018 0912   BUN 17.7 05/13/2017 0927   CREATININE 1.52 (H) 12/05/2018 0912    CREATININE 1.62 (H) 09/18/2018 0911   CREATININE 1.4 (H) 05/13/2017 0927   GLU 104 02/13/2016 1358      Component Value Date/Time   CALCIUM 10.5 (H) 12/05/2018 0912   CALCIUM 10.5 (H) 05/13/2017 0927   ALKPHOS 61 12/05/2018 0912   ALKPHOS 90 05/13/2017 0927   AST 27 12/05/2018 0912   AST 14 05/13/2017 0927   ALT 14 12/05/2018 0912   ALT 12 05/13/2017 0927   BILITOT 0.5 12/05/2018 0912   BILITOT 0.27 05/13/2017 0927       Lab Results  Component Value Date   LABCA2 11 04/20/2008    No components found for: [id number]  No results for input(s): INR in the last 168 hours.  Urinalysis    Component Value Date/Time   COLORURINE YELLOW 05/30/2018 1123   APPEARANCEUR CLEAR 05/30/2018 1123   LABSPEC 1.017 05/30/2018 1123   PHURINE 6.0 05/30/2018 1123   GLUCOSEU NEGATIVE 05/30/2018 1123   GLUCOSEU NEG mg/dL 34/74/2595 6387   HGBUR NEGATIVE 05/30/2018 1123   BILIRUBINUR NEGATIVE 05/30/2018 1123   KETONESUR  NEGATIVE 05/30/2018 1123   PROTEINUR NEGATIVE 05/30/2018 1123   UROBILINOGEN 1 07/23/2006 2113   NITRITE NEGATIVE 05/30/2018 1123   LEUKOCYTESUR NEGATIVE 05/30/2018 1123     STUDIES: Nm Pet Image Restag (ps) Skull Base To Thigh  Result Date: 11/28/2018 CLINICAL DATA:  Subsequent treatment strategy for RIGHT breast carcinoma. EXAM: NUCLEAR MEDICINE PET SKULL BASE TO THIGH TECHNIQUE: 12.7 mCi F-18 FDG was injected intravenously. Full-ring PET imaging was performed from the skull base to thigh after the radiotracer. CT data was obtained and used for attenuation correction and anatomic localization. Fasting blood glucose: 93 mg/dl COMPARISON:  95/28/4132 FINDINGS: Mediastinal blood pool activity: SUV max 3.1 Liver activity: SUV max NA NECK: No hypermetabolic lymph nodes in the neck. Incidental CT findings: none CHEST: Marked interval increase in hypermetabolic mediastinal lymphadenopathy. Bulky subcarinal and RIGHT paratracheal nodes with SUV max equal 15.3. Hypermetabolic nodes  extend to the high RIGHT paratracheal / inferior supraclavicular nodal stations on the RIGHT. There is a hypermetabolic lymph node positioned between the clavicle heads with SUV max equal 10.7. There is increased hypermetabolic pleural thickening involving the near entirety of the RIGHT hemithorax. Activity is increased compared to prior with SUV max equal 16.3 anteriorly for example compared to 11.3. In the RIGHT middle lobe there peripherally hypermetabolic mass measuring approximately 5 cm SUV max equal 22.3. Previously this lesion is less well-defined measured approximately 3.5 cm SUV max equal 15.5. No evidence of metastatic disease in the LEFT hemithorax. Incidental CT findings: Post RIGHT mastectomy anatomy. Port in the anterior chest wall with tip in distal SVC. ABDOMEN/PELVIS: There is a hypermetabolic lymph node in the gastrohepatic ligament measuring 1.5 cm with SUV max equal 9.3. A second lymph node LEFT of the aorta at the level the renal veins with SUV max equal 12.8. No liver metastasis. Incidental CT findings: Uterus and ovaries normal SKELETON: Hypermetabolic focus within RIGHT pedicle of the T1 vertebral body with SUV max equal 7.2.There is subtle lucency on the CT portion exam (image 46/). Incidental CT findings: None IMPRESSION: 1. Unfortunately there is progression of metastatic breast cancer in the RIGHT hemithorax. Interval increase in the rind of hypermetabolic pleural thickening in the RIGHT hemithorax. 2. Interval increase in size of RIGHT middle lobe mass which measures up to 5 cm with intense peripheral metabolic activity. 3. Marked increase in hypermetabolic nodal metastasis in the subcarinal, RIGHT paratracheal and RIGHT supraclavicular nodal stations. 4. Concern for solitary skeletal metastasis to the RIGHT pedicle of the T1 vertebral body. Electronically Signed   By: Genevive Bi M.D.   On: 11/28/2018 12:33      ELIGIBLE FOR AVAILABLE RESEARCH PROTOCOL: no   ASSESSMENT: 63  y.o. [place] woman   (1) status post right lumpectomy and sentinel lymph node sampling October 2005 for a 0.6 cm invasive ductal carcinoma involving one out of 2 sentinel lymph nodes sampled, grade 3, triple-negative, treated adjuvantly with doxorubicin and cyclophosphamide 4 followed by weekly paclitaxel 7, followed by adjuvant radiation  RECURRENT DISEASE: (2) status post right breast upper outer quadrant biopsy and right axillary lymph node biopsy 10/01/2016, both positive for a T2 N1, stage IIIB invasive ductal carcinoma, triple negative, with an MIB-1 of 50-70%  (3) status post right modified radical mastectomy 11/05/2016 showing a pT2 pN1, stage IIIB invasive ductal carcinoma, grade 3, triple negative, with negative margins  (4) Not a candidate for radiation given prior history  (5) adjuvant chemotherapy consisting of carboplatin and gemcitabine given days 1 and 8 of each 21  day cycle, for 6 cycles, starting 11/20/2016, completed 03/11/2017  (a) day 8 cycle 2 omitted because of neutropenia; Neupogen/Neulasta added  (5) HIV positivity  (6) Genetic testing 06/17/2017:  no pathogenic mutations. Genes tested: APC, ATM, AXIN2, BARD1, BLM, BMPR1A, BRCA1, BRCA2, BRIP1, CDH1, CDK4, CDKN2A (p14ARF), CDKN2A (p16INK4a), CEBPA, CHEK2, CTNNA1, DICER1, EPCAM*, GATA2, GREM1*, HRAS, KIT, MEN1, MLH1, MSH2, MSH3, MSH6, MUTYH, NBN, NF1, PALB2, PDGFRA, PMS2, POLD1, POLE, PTEN, RAD50, RAD51C, RAD51D, RUNX1, SDHB, SDHC, SDHD, SMAD4, SMARCA4, STK11, TERC, TERT, TP53, TSC1, TSC2, VHL. The following genes were evaluated for sequence changes only: HOXB13*, NTHL1*, SDHA.   (a) A variant of uncertain significance (VUS) in a gene called NTHL1 was also noted. c.736G>A (p.Ala246Thr)  METASTATIC DISEASE: November 2019 (1) Patient seen in urgent care and ultimately ED on 10/21 for shortness of breath, chest xray demonstrated Right pleural effusion.   (a) right thoracenteses on 10/21 and 11/4 results show atypical  cells, non diagnostic (b) CT chest 04/22/2018 shows re-accumulation of fluid, and right pleural nodularity. (c) PET scan on 05/01/2018 shows hypermetabolic pleural based metastases, right CP angle nodal metastases, no evidence of malignancy in abdomen and pelvis. (d) bronchoscopy with biopsy by Dr. Delton Coombes and BAL on 05/15/2018 was non diagnostic  (e) VAT biopsy of the right pleura 05/30/2018 confirms carcinoma, again triple negative  (f) [facility] shows PD-L1 positive (1% in [facility]); otherwise microsatellite stable, TMB low (5/Mb), no PIK3 mutations; other mutations suggest sensitivity to MTOR inhibitors and several TKIs  (2) Atezolizumab started 07/15/2018 given every other week  (a) PET 08/13/2018 shows tumor Right hemithorax (new baseline study)  (b) Atezo changed to Q4w starting with 11/07/2018 dose  (c) PET 11/28/2018 documents progression in the right lung and pleural area as well as lymph nodes, and a T1 skeletal metastasis  (d) eribulin day 1 and 8 of every 21 day cycle started 12/12/2018  PLAN:  Trevia's cancer has clearly progressed through the checkpoint inhibitor we are giving her.  Clinically also she has lost weight, feels more tired, and has more pain.  She understands we are going to have to add chemotherapy for this triple negative tumor.  Specifically we are going to add eribulin given days 1 and 8 of each 21-day cycle, while continuing the atezolizumab.    We discussed the possible toxicities side effects and complications of eribulin in detail and the one that concerns me the most is peripheral neuropathy, which is already an issue with Kayla Price.  However she feels her neuropathy has not been progressive, does not affect her activities of daily living, and she is aware enough of the symptoms that if we start getting any worsening we will discontinue this chemotherapy.  She has been receiving the atezolizumab every 4 weeks.  She will receive her regulant on 6/19 and 6/26, and  then she will receive regulant and atezolizumab enthesis at a q. 21-day dose) again on January 02, 2019.  After 3 cycles she will be restaged  I have encouraged her to use her pain medicine to optimize her functional status.  She will make sure that she has soft bowel movements at least every 3 days and she is on a bowel prophylaxis regimen for this.  She knows to call for any other issues that may develop before her next visit here.     Uchechi Denison, Valentino Hue, MD  12/05/18 12:35 PM Medical Oncology and Hematology [facility] [address] [place], [place] [address] Tel. [phone]    Fax.  [phone]  I, Mal Misty am acting as a Neurosurgeon for Lowella Dell, MD.   I, Ruthann Cancer MD, have reviewed the above documentation for accuracy and completeness, and I agree with the above.

## 2018-12-04 NOTE — Telephone Encounter (Signed)
[facility] Database verified and compliance confirmed   Last filled 11/10/2018

## 2018-12-05 ENCOUNTER — Inpatient Hospital Stay: Payer: Medicare HMO | Attending: Adult Health

## 2018-12-05 ENCOUNTER — Other Ambulatory Visit: Payer: Self-pay

## 2018-12-05 ENCOUNTER — Inpatient Hospital Stay: Payer: Medicare HMO

## 2018-12-05 ENCOUNTER — Inpatient Hospital Stay (HOSPITAL_BASED_OUTPATIENT_CLINIC_OR_DEPARTMENT_OTHER): Payer: Medicare HMO | Admitting: Oncology

## 2018-12-05 ENCOUNTER — Ambulatory Visit: Payer: Medicare HMO | Admitting: Oncology

## 2018-12-05 ENCOUNTER — Other Ambulatory Visit: Payer: Medicare HMO

## 2018-12-05 VITALS — BP 123/68 | HR 94 | Temp 98.3°F | Resp 17 | Ht 67.0 in | Wt 249.6 lb

## 2018-12-05 DIAGNOSIS — E119 Type 2 diabetes mellitus without complications: Secondary | ICD-10-CM | POA: Insufficient documentation

## 2018-12-05 DIAGNOSIS — I10 Essential (primary) hypertension: Secondary | ICD-10-CM | POA: Insufficient documentation

## 2018-12-05 DIAGNOSIS — Z79899 Other long term (current) drug therapy: Secondary | ICD-10-CM

## 2018-12-05 DIAGNOSIS — C787 Secondary malignant neoplasm of liver and intrahepatic bile duct: Secondary | ICD-10-CM | POA: Insufficient documentation

## 2018-12-05 DIAGNOSIS — Z9011 Acquired absence of right breast and nipple: Secondary | ICD-10-CM

## 2018-12-05 DIAGNOSIS — C7801 Secondary malignant neoplasm of right lung: Secondary | ICD-10-CM | POA: Insufficient documentation

## 2018-12-05 DIAGNOSIS — N183 Chronic kidney disease, stage 3 (moderate): Secondary | ICD-10-CM | POA: Insufficient documentation

## 2018-12-05 DIAGNOSIS — C50911 Malignant neoplasm of unspecified site of right female breast: Secondary | ICD-10-CM

## 2018-12-05 DIAGNOSIS — Z171 Estrogen receptor negative status [ER-]: Secondary | ICD-10-CM

## 2018-12-05 DIAGNOSIS — Z5111 Encounter for antineoplastic chemotherapy: Secondary | ICD-10-CM | POA: Insufficient documentation

## 2018-12-05 DIAGNOSIS — C7800 Secondary malignant neoplasm of unspecified lung: Secondary | ICD-10-CM | POA: Insufficient documentation

## 2018-12-05 DIAGNOSIS — C7951 Secondary malignant neoplasm of bone: Secondary | ICD-10-CM | POA: Insufficient documentation

## 2018-12-05 DIAGNOSIS — Z5112 Encounter for antineoplastic immunotherapy: Secondary | ICD-10-CM | POA: Insufficient documentation

## 2018-12-05 DIAGNOSIS — C50411 Malignant neoplasm of upper-outer quadrant of right female breast: Secondary | ICD-10-CM | POA: Insufficient documentation

## 2018-12-05 DIAGNOSIS — E039 Hypothyroidism, unspecified: Secondary | ICD-10-CM | POA: Diagnosis not present

## 2018-12-05 LAB — CMP (CANCER CENTER ONLY)
ALT: 14 U/L (ref 0–44)
AST: 27 U/L (ref 15–41)
Albumin: 3.5 g/dL (ref 3.5–5.0)
Alkaline Phosphatase: 61 U/L (ref 38–126)
Anion gap: 11 (ref 5–15)
BUN: 16 mg/dL (ref 8–23)
CO2: 28 mmol/L (ref 22–32)
Calcium: 10.5 mg/dL — ABNORMAL HIGH (ref 8.9–10.3)
Chloride: 102 mmol/L (ref 98–111)
Creatinine: 1.52 mg/dL — ABNORMAL HIGH (ref 0.44–1.00)
GFR, Est AFR Am: 42 mL/min — ABNORMAL LOW (ref >60)
GFR, Estimated: 36 mL/min — ABNORMAL LOW (ref >60)
Glucose, Bld: 100 mg/dL — ABNORMAL HIGH (ref 70–99)
Potassium: 3.7 mmol/L (ref 3.5–5.1)
Sodium: 141 mmol/L (ref 135–145)
Total Bilirubin: 0.5 mg/dL (ref 0.3–1.2)
Total Protein: 8.3 g/dL — ABNORMAL HIGH (ref 6.5–8.1)

## 2018-12-05 LAB — CBC WITH DIFFERENTIAL (CANCER CENTER ONLY)
Abs Immature Granulocytes: 0.01 10*3/uL (ref 0.00–0.07)
Basophils Absolute: 0.1 10*3/uL (ref 0.0–0.1)
Basophils Relative: 1 %
Eosinophils Absolute: 0.2 10*3/uL (ref 0.0–0.5)
Eosinophils Relative: 4 %
HCT: 40.6 % (ref 36.0–46.0)
Hemoglobin: 13.2 g/dL (ref 12.0–15.0)
Immature Granulocytes: 0 %
Lymphocytes Relative: 23 %
Lymphs Abs: 1.2 10*3/uL (ref 0.7–4.0)
MCH: 32.1 pg (ref 26.0–34.0)
MCHC: 32.5 g/dL (ref 30.0–36.0)
MCV: 98.8 fL (ref 80.0–100.0)
Monocytes Absolute: 0.6 10*3/uL (ref 0.1–1.0)
Monocytes Relative: 11 %
Neutro Abs: 3.3 10*3/uL (ref 1.7–7.7)
Neutrophils Relative %: 61 %
Platelet Count: 212 10*3/uL (ref 150–400)
RBC: 4.11 MIL/uL (ref 3.87–5.11)
RDW: 13.2 % (ref 11.5–15.5)
WBC Count: 5.3 10*3/uL (ref 4.0–10.5)
nRBC: 0 % (ref 0.0–0.2)

## 2018-12-05 LAB — TSH: TSH: 2.535 u[IU]/mL (ref 0.308–3.960)

## 2018-12-05 MED ORDER — SODIUM CHLORIDE 0.9 % IV SOLN
1680.0000 mg | Freq: Once | INTRAVENOUS | Status: AC
  Administered 2018-12-05: 1680 mg via INTRAVENOUS
  Filled 2018-12-05: qty 28

## 2018-12-05 MED ORDER — PROCHLORPERAZINE MALEATE 10 MG PO TABS
ORAL_TABLET | ORAL | Status: AC
  Filled 2018-12-05: qty 1

## 2018-12-05 MED ORDER — PROCHLORPERAZINE MALEATE 10 MG PO TABS
10.0000 mg | ORAL_TABLET | Freq: Once | ORAL | Status: AC
  Administered 2018-12-05: 10 mg via ORAL

## 2018-12-05 MED ORDER — SODIUM CHLORIDE 0.9 % IV SOLN
Freq: Once | INTRAVENOUS | Status: AC
  Administered 2018-12-05: 11:00:00 via INTRAVENOUS
  Filled 2018-12-05: qty 250

## 2018-12-05 MED ORDER — SODIUM CHLORIDE 0.9% FLUSH
10.0000 mL | INTRAVENOUS | Status: DC | PRN
  Filled 2018-12-05: qty 10

## 2018-12-05 MED ORDER — ACETAMINOPHEN 325 MG PO TABS
650.0000 mg | ORAL_TABLET | Freq: Once | ORAL | Status: AC
  Administered 2018-12-05: 650 mg via ORAL

## 2018-12-05 MED ORDER — HEPARIN SOD (PORK) LOCK FLUSH 100 UNIT/ML IV SOLN
500.0000 [IU] | Freq: Once | INTRAVENOUS | Status: DC | PRN
  Filled 2018-12-05: qty 5

## 2018-12-05 MED ORDER — ACETAMINOPHEN 325 MG PO TABS: 650.0000 mg | ORAL_TABLET | Freq: Once | ORAL | Status: DC

## 2018-12-05 MED ORDER — ACETAMINOPHEN 325 MG PO TABS
ORAL_TABLET | ORAL | Status: AC
  Filled 2018-12-05: qty 2

## 2018-12-05 NOTE — Progress Notes (Signed)
Okay to treat with creatinine level today per Dr. Doris Cheadle.

## 2018-12-05 NOTE — Patient Instructions (Signed)
[facility] Discharge Instructions for Patients Receiving Chemotherapy  Today you received the following chemotherapy agents Tecentriq. To help prevent nausea and vomiting after your treatment, we encourage you to take your nausea medication.   If you develop nausea and vomiting that is not controlled by your nausea medication, call the clinic.   BELOW ARE SYMPTOMS THAT SHOULD BE REPORTED IMMEDIATELY:  *FEVER GREATER THAN 100.5 F  *CHILLS WITH OR WITHOUT FEVER  NAUSEA AND VOMITING THAT IS NOT CONTROLLED WITH YOUR NAUSEA MEDICATION  *UNUSUAL SHORTNESS OF BREATH  *UNUSUAL BRUISING OR BLEEDING  TENDERNESS IN MOUTH AND THROAT WITH OR WITHOUT PRESENCE OF ULCERS  *URINARY PROBLEMS  *BOWEL PROBLEMS  UNUSUAL RASH Items with * indicate a potential emergency and should be followed up as soon as possible.  Feel free to call the clinic should you have any questions or concerns. The clinic phone number is [phone].  Please show the [place] at check-in to the Emergency Department and triage nurse.

## 2018-12-05 NOTE — Progress Notes (Signed)
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: Atezolizumab 840 mg D1, 15 + Nab-Paclitaxel (Abraxane) 100 mg/m2 D1, 8, 15 q28 Days Until Progression or Unacceptable Toxicity TREATMENT RESPONSE: Progressive Disease (PD)  START OFF PATHWAY REGIMEN - Breast   [id number]:Eribulin 1.4 mg/m2 D1, 8 q21 Days:   A cycle is every 21 days:     Eribulin mesylate   **Always confirm dose/schedule in your pharmacy ordering system**  Patient Characteristics: 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

## 2018-12-06 LAB — T4: T4, Total: 12 ug/dL (ref 4.5–12.0)

## 2018-12-06 LAB — CANCER ANTIGEN 27.29: CA 27.29: 42.5 U/mL — ABNORMAL HIGH (ref 0.0–38.6)

## 2018-12-08 ENCOUNTER — Telehealth: Payer: Self-pay | Admitting: Oncology

## 2018-12-08 ENCOUNTER — Ambulatory Visit: Payer: Medicare HMO | Admitting: Oncology

## 2018-12-08 ENCOUNTER — Other Ambulatory Visit: Payer: Self-pay | Admitting: *Deleted

## 2018-12-08 MED ORDER — METHOCARBAMOL 500 MG PO TABS: 500.0000 mg | ORAL_TABLET | Freq: Three times a day (TID) | ORAL | 1 refills | Status: DC | PRN

## 2018-12-08 MED FILL — HYDROCODON-APAP 5-325: 5-325 | 15 days supply | Qty: 60 | Fill #0

## 2018-12-08 MED FILL — OLMESARTAN-HCTZ 40-25 MG TA: 40-25 | 30 days supply | Qty: 30 | Fill #1

## 2018-12-08 MED FILL — METHOCARBAMOL 500 MG TABLET: 500 | 30 days supply | Qty: 90 | Fill #0

## 2018-12-08 NOTE — Telephone Encounter (Signed)
I talk with patient regarding schedule  

## 2018-12-11 MED FILL — SELZENTRY 150 MG TABS: 150 | 30 days supply | Qty: 60 | Fill #5

## 2018-12-12 ENCOUNTER — Telehealth: Payer: Self-pay | Admitting: Adult Health

## 2018-12-12 ENCOUNTER — Telehealth: Payer: Self-pay | Admitting: Emergency Medicine

## 2018-12-12 ENCOUNTER — Inpatient Hospital Stay: Payer: Medicare HMO

## 2018-12-12 ENCOUNTER — Inpatient Hospital Stay: Payer: Medicare HMO | Admitting: Adult Health

## 2018-12-12 ENCOUNTER — Other Ambulatory Visit: Payer: Self-pay | Admitting: *Deleted

## 2018-12-12 NOTE — Telephone Encounter (Signed)
Cancelled all appts for pt today per NP Mendel Ryder (12/12/2018) d/t issues with medication authorization/insurance coverage.  Charge RN Delle Reining and infusion RN [place] aware.

## 2018-12-12 NOTE — Telephone Encounter (Signed)
Spoke with Dr. Evelina Bucy at [phone].  I requested consideration for Atezolizumab with Eribulin, and reviewed that there has been a study showing preliminary data that indicates improvement in ORR with Eribulin and Pembrolizumab.  He reviewed information, and notes that they do not give approval based on abstracts from conferences, and he doesn't note where the study included patients who had failed a PDL1 inhibitor prior.  He asked me if I wanted to discontinue the Atezolizumab order, or file an appeal.  I requested an appeal, which he noted he filed.  Wilber Bihari, NP

## 2018-12-18 NOTE — Progress Notes (Signed)
[facility]  [phone] [phone]    ID: KRISTEY TOMSON DOB: 16-Apr-1956  MR#: [id number]  GEX#:[id number]  Patient Care Team: Sharon Seller, NP as PCP - General (Nurse Practitioner) Daiva Eves, Lisette Grinder, MD as PCP - Infectious Diseases (Infectious Diseases) Ernesto Rutherford, MD as Consulting Physician (Ophthalmology) Romero Belling, MD as Consulting Physician (Endocrinology) Gean Birchwood, MD as Consulting Physician (Orthopedic Surgery) Jamieon Lannen, Valentino Hue, MD as Consulting Physician (Oncology) Claud Kelp, MD as Consulting Physician (General Surgery) Reva Bores, MD as Consulting Physician (Obstetrics and Gynecology) Axel Filler, Larna Daughters, NP as Nurse Practitioner (Hematology and Oncology) Glenford Peers, OD as Referring Physician (Optometry) OTHER MD:   CHIEF COMPLAINT: Triple negative breast cancer, recurrent  CURRENT TREATMENT:  eribulin   INTERVAL HISTORY: Jhournee returns today for follow-up and treatment of her recurrent triple negative breast cancer.  She had been on atezolizumab. She tolerated that well but there was evidence of disease progression. I would have continued it but added chemotherapy but her insurance would not approve continuing the atezo while receiving eribulin so the Duffy Bruce is being stopped and she is starting eribulin today. They did eventually approve the eribulin.  She will receive eribulin days 1 and 8 of each 21 day cycle. Today is day 1 cycle 63  Breezie c/o does have deep bony aches. She does feel better if she is able to get outside and get some sun and fresh air.   Since her last visit here, she has not undergone any additional studies.    Results for LILLYANA, SODEN (MRN [id number]) as of 12/19/2018 09:27  Ref. Range 09/12/2018 10:33 09/26/2018 12:52 10/10/2018 11:53 11/07/2018 12:27 12/05/2018 09:12  CA 27.29 Latest Ref Range: 0.0 - 38.6 U/mL 29.5 32.0 39.5 (H) 39.2 (H) 42.5 (H)   She is  scheduled for mammography on 12/22/2018.      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 She takes 1 narco and 1 methocarbamol at night to help her sleep. She sometimes takes a norco during the day as well. This barely controls her pain, which remains at a 5-6 level. She does get constipated, which she believes is due to her limited appetite. She has cut beef and pork from her diet, and has generally been trying to eat healthier.  The patient denies unusual headaches, visual changes, nausea, vomiting, or dizziness. There has been no unusual cough, phlegm production, or pleurisy. She has been losing weight. A detailed review of systems was otherwise noncontributory.   Wt Readings from Last 3 Encounters:  12/19/18 243 lb 9.6 oz (110.5 kg)  12/05/18 249 lb 9.6 oz (113.2 kg)  10/24/18 259 lb 8 oz (117.7 kg)      BREAST CANCER HISTORY: From the original intake note:  Hinda is a history of right-sided breast cancer dating back to 2005. She had a lumpectomy with sentinel lymph node sampling, chemotherapy, and radiation. I do not have access to those records at present.  More recently she [facility] showing a possible mass in the right breast. Diagnostic mammography with ultrasonography on 09/28/2016 the patient underwent right diagnostic mammography with tomography and right breast ultrasonography. The breast density was category A. In the right breast at the 10:00 position there was an irregular mass measuring 2.5 cm. Ultrasound identified this the 10:00 radiant 10 cm from the nipple measuring 2.4 cm. In the right axilla  there was an abnormal lymph node measuring 1.3 cm with other normal-appearing lymph nodes.  On 10/01/2016 she  underwent biopsy of the right breast mass in question as well as the suspicious axillary lymph node. Both were positive for invasive ductal  carcinoma, grade 3, estrogen and progesterone receptor negative, HER-2 nonamplified, the signals ratio being 1.44-1.47 and the number per cell 2.95-2.20. The proliferation marker was 70% in the breast lesion and 50% in the lymph node.  Her subsequent history is as detailed below.    PAST MEDICAL HISTORY: Past Medical History:  Diagnosis Date  . Alopecia areata 11/28/2009  . Bell's palsy   . Cancer [facility]) 2005   Breast cancer   chemotherapy and radiation  . CKD (chronic kidney disease) stage 3, GFR 30-59 ml/min (HCC) 06/08/2015  . Dry eye syndrome   . Family history of lung cancer   . Family history of non-Hodgkin's lymphoma   . Fasting hyperglycemia   . Gestational diabetes    2001  . HIP FRACTURE, RIGHT 05/06/2008  . History of kidney stones   . HIV DISEASE 03/27/2006  . HYPERLIPIDEMIA, MIXED 12/15/2007  . HYPERTENSION 03/27/2006  . 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 MENORRHAGIA, POSTMENOPAUSAL 02/03/2009  . Osteoarthritis of left knee 06/08/2015  . OSTEOARTHROSIS, LOCAL, SCND, UNSPC SITE 04/07/2007  . PVD 04/07/2007  . Tinea capitis   . TRIGGER FINGER 05/06/2008  . Unspecified vitamin D deficiency 08/06/2007    PAST SURGICAL HISTORY: Past Surgical History:  Procedure Laterality Date  . BREAST LUMPECTOMY Right    2005  . CHEST TUBE INSERTION Right 05/30/2018   Procedure: INSERTION PLEURAL DRAINAGE CATHETER;  Surgeon: Delight Ovens, MD;  Location: [facility] OR;  Service: Thoracic;  Laterality: Right;  . COLONOSCOPY    . COLONOSCOPY WITH ESOPHAGOGASTRODUODENOSCOPY (EGD)    . ENDOBRONCHIAL ULTRASOUND Bilateral 05/15/2018   Procedure: ENDOBRONCHIAL ULTRASOUND;  Surgeon: Leslye Peer, MD;  Location: [facility] ENDOSCOPY;  Service: Cardiopulmonary;  Laterality: Bilateral;  . FINE NEEDLE ASPIRATION BIOPSY  05/15/2018   Procedure: FINE NEEDLE ASPIRATION BIOPSY;  Surgeon: Leslye Peer, MD;  Location: [facility];  Service: Cardiopulmonary;;  . FLEXIBLE BRONCHOSCOPY  05/15/2018    Procedure: FLEXIBLE BRONCHOSCOPY;  Surgeon: Leslye Peer, MD;  Location: [facility]COPY;  Service: Cardiopulmonary;;  . HYSTEROSCOPY  2006  . IR IMAGING GUIDED PORT INSERTION  07/14/2018  . IR THORACENTESIS ASP PLEURAL SPACE W/IMG GUIDE  04/14/2018  . IR THORACENTESIS ASP PLEURAL SPACE W/IMG GUIDE  04/28/2018  . KNEE ARTHROSCOPY Left 2002  . LYMPH NODE DISSECTION  2005  . MASTECTOMY MODIFIED RADICAL Right 11/05/2016   Procedure: RIGHT MASTECTOMY MODIFIED RADICAL;  Surgeon: Claud Kelp, MD;  Location: [facility] OR;  Service: General;  Laterality: Right;  . MODIFIED RADICAL MASTECTOMY Right 11/05/2016  . placement   of port-a-cath    . PLEURAL BIOPSY Right 05/30/2018   Procedure: PLEURAL BIOPSY;  Surgeon: Delight Ovens, MD;  Location: [facility] OR;  Service: Thoracic;  Laterality: Right;  . PLEURAL EFFUSION DRAINAGE Right 05/30/2018   Procedure: DRAINAGE OF PLEURAL EFFUSION;  Surgeon: Delight Ovens, MD;  Location: [facility] OR;  Service: Thoracic;  Laterality: Right;  . PORT-A-CATH REMOVAL  2006   insertion 2005  . PORT-A-CATH REMOVAL N/A 03/29/2017   Procedure: REMOVAL PORT-A-CATH;  Surgeon: Claud Kelp, MD;  Location: [facility] OR;  Service: General;  Laterality: N/A;  . PORTACATH PLACEMENT Right 11/05/2016   Procedure: INSERTION PORT-A-CATH WITH ULTRA SOUND;  Surgeon: Claud Kelp, MD;  Location: [facility] OR;  Service: General;  Laterality: Right;  . removal of port a cath  12/2014  . TALC PLEURODESIS Right 05/30/2018   Procedure: Lurlean Nanny;  Surgeon: Delight Ovens, MD;  Location: [facility] OR;  Service: Thoracic;  Laterality: Right;  . THYROID SURGERY  2009   Ablation   . TOTAL KNEE ARTHROPLASTY Left 02/06/2016   Procedure: TOTAL KNEE ARTHROPLASTY;  Surgeon: Gean Birchwood, MD;  Location: [facility] OR;  Service: Orthopedics;  Laterality: Left;  . TUBAL LIGATION    . VIDEO ASSISTED THORACOSCOPY Right 05/30/2018   Procedure: VIDEO ASSISTED THORACOSCOPY;  Surgeon: Delight Ovens, MD;  Location: [facility] OR;   Service: Thoracic;  Laterality: Right;    FAMILY HISTORY Family History  Problem Relation Age of Onset  . Heart attack Brother        Massive MI in 40s  . Stroke Brother        CAD  . Kidney disease Mother   . Stroke Mother   . Diabetes Mother   . Liver disease Sister   . COPD Sister        had I-131 rx of hyperthyroidism  . Diabetes Sister   . Stroke Sister   . Lung cancer Paternal Uncle        hx smoking  . Cancer Cousin   . Non-Hodgkin's lymphoma Cousin 25       cancer x3, in prostate and lung- unsure if met/spread or if primaries  . Heart failure Father   . Heart disease Father   . Arthritis Father   . Sarcoidosis Sister    The patient's father died at the age of 37 in the setting of Alzheimer's disease. The patient's mother died at the age of 20 from complications of diabetes. The patient has 3 brothers, 4 sisters. There is no history of breast or ovarian cancer in the family area    GYNECOLOGIC HISTORY:  Patient's last menstrual period was 11/06/2003.  menarche age 88, first live birth age 49, the patient is GX P2. She stopped having periods in 2005, with her chemotherapy; she never took hormone replacement    SOCIAL HISTORY:  Geniene worked for the [employer] more than 20 years. 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 At home she lives with her daughter Freda Munro who works for [employer] and her granddaughter [name], 33 y/o as of APRIL 2018.  The patient's son also drops infrequently.  They are all careful regarding pandemic precautions    ADVANCED DIRECTIVES:  not in place    HEALTH MAINTENANCE: Social History   Tobacco Use  . Smoking status: Never Smoker  . Smokeless tobacco: Never Used  Substance Use Topics  . Alcohol use: No  . Drug use: No     Colonoscopy:  PAP:  Bone density:   Allergies  Allergen Reactions  . Lisinopril Anaphylaxis and Swelling    Swelling of tongue and mouth 11/05/16- tolerates  Olmesartan  . Pepcid [Famotidine] Other (See Comments)    PPI H2, BLOCKERS LOWER GASTRIC PH WHICH WOULD LEAD TO SUBTHERAPEUTIC RILPIVIRINE LEVELS AND POTENTIAL VIROLOGICAL FAILURE WITH RESISTANCE  . Prilosec [Omeprazole] Other (See Comments)    PPI H2, BLOCKERS LOWER GASTRIC PH WHICH WOULD LEAD TO SUBTHERAPEUTIC RILPIVIRINE LEVELS AND POTENTIAL VIROLOGICAL FAILURE WITH RESISTANCE  . Tums [Calcium Carbonate Antacid] Other (See Comments)    TUMS ANTACIDS CAN LOWER GASTRIC PH WHICH COULD  LEAD TO SUBTHERAPEUTIC RILPIVIRINE LEVELS AND POTENTIAL VIROLOGICAL FAILURE WITH RESISTANCE TUMS CAN  BE GIVEN BUT NEED CONSULT WITH [facility] PHARM[facility] RE TIMING. I PREFER HER TO AVOID ALL TOGETHER    Current Outpatient Medications  Medication Sig Dispense Refill  . CALCIUM CITRATE-VITAMIN D3 PO Take by mouth. 630 mg of calcium and 13 mcg d3, take once daily    . Cholecalciferol (D3 ADULT PO) Take 25 mcg by mouth daily.    . Coenzyme Q10 (COQ10) 100 MG CAPS Take by mouth daily.    Marland Kitchen dexamethasone (DECADRON) 4 MG tablet Take 1-2 tablets (4-8 mg total) by mouth 2 (two) times daily as needed. 40 tablet 6  . ELDERBERRY PO Take 50 mg by mouth daily.    Marland Kitchen HYDROcodone-acetaminophen (NORCO/VICODIN) 5-325 MG tablet Take one tablet 4 times a day as instructed 120 tablet 0  . hydrocortisone cream 1 % Apply 1 application topically daily as needed for itching.    . JULUCA 50-25 MG TABS TAKE 1 TABLET BY MOUTH DAILY WITH BREAKFAST. TAKE WITH THE PREZCOBIX. 30 tablet 5  . levothyroxine (SYNTHROID, LEVOTHROID) 175 MCG tablet Take 1 tablet (175 mcg total) by mouth daily before breakfast. 90 tablet 1  . lidocaine-prilocaine (EMLA) cream Apply 1 application topically as needed. Apply to port site 1 hour prior to access 30 g 0  . Loratadine 10 MG CAPS Take by mouth as needed.    . Menthol, Topical Analgesic, (BENGAY EX) Apply 1 application topically daily as needed (muscle pain).    . methocarbamol (ROBAXIN) 500 MG tablet Take 1 tablet (500  mg total) by mouth every 8 (eight) hours as needed for muscle spasms. 90 tablet 1  . Methylsulfonylmethane (MSM) 1000 MG TABS Take by mouth daily.    . Naphazoline-Glycerin (REDNESS RELIEF OP) Place 1 drop into both eyes daily as needed (redness).    Marland Kitchen olmesartan-hydrochlorothiazide (BENICAR HCT) 40-25 MG tablet TAKE 1 TABLET BY MOUTH DAILY. 30 tablet 1  . Omega-3 Fatty Acids (FISH OIL) 1000 MG CAPS Take by mouth daily.    . potassium gluconate 595 (99 K) MG TABS tablet Take 595 mg by mouth daily.    Marland Kitchen PREZCOBIX 800-150 MG tablet TAKE 1 TABLET BY MOUTH DAILY. SWALLOW WHOLE. DO NOT CRUSH, BREAK OR CHEW TABLETS. TAKE WITH FOOD. 30 tablet 5  . Probiotic Product (PROBIOTIC DAILY PO) Take by mouth daily.    . prochlorperazine (COMPAZINE) 10 MG tablet Take 1 tablet (10 mg total) by mouth every 6 (six) hours as needed for nausea or vomiting. 30 tablet 4  . SELZENTRY 150 MG tablet TAKE ONE TABLET BY MOUTH TWICE DAILY 60 tablet 5  . Study - REPRIEVE [phone] - pitavastatin 4 mg or placebo tablet (PI-[place]) Take 1 tablet (4 mg total) by mouth daily. 30 tablet   . tobramycin-dexamethasone (TOBRADEX) ophthalmic solution Place 2 drops into the right eye every 4 (four) hours while awake. 5 mL 0  . Vitamin E 180 MG CAPS Take by mouth daily.     No current facility-administered medications for this visit.     OBJECTIVE: Morbidly obese African-American woman  Vitals:   12/19/18 1244  BP: 116/77  Pulse: 100  Resp: 18  Temp: 98.5 F (36.9 C)  SpO2: 95%     Body mass index is 38.15 kg/m.    Filed Weights   12/19/18 1244  Weight: 243 lb 9.6 oz (110.5 kg)  ECOG FS: 2  Sclerae unicteric, EOMs intact Wearing a mask No cervical or supraclavicular adenopathy Lungs no rales or rhonchi Heart regular rate and rhythm Abd  soft, nontender, positive bowel sounds MSK no focal spinal tenderness, no upper extremity lymphedema Neuro: nonfocal, well oriented, appropriate affect Breasts: Status post right  mastectomy, with no evidence of chest wall recurrence.  The left breast is unremarkable.  Both axillae are benign. Port in right upper anterior chest, intact  LAB RESULTS:  CMP     Component Value Date/Time   NA 138 12/19/2018 1158   NA 140 05/13/2017 0927   K 3.8 12/19/2018 1158   K 4.1 05/13/2017 0927   CL 101 12/19/2018 1158   CO2 27 12/19/2018 1158   CO2 29 05/13/2017 0927   GLUCOSE 86 12/19/2018 1158   GLUCOSE 102 05/13/2017 0927   BUN 14 12/19/2018 1158   BUN 17.7 05/13/2017 0927   CREATININE 1.37 (H) 12/19/2018 1158   CREATININE 1.62 (H) 09/18/2018 0911   CREATININE 1.4 (H) 05/13/2017 0927   CALCIUM 10.3 12/19/2018 1158   CALCIUM 10.5 (H) 05/13/2017 0927   PROT 8.3 (H) 12/19/2018 1158   PROT 8.1 05/13/2017 0927   ALBUMIN 3.6 12/19/2018 1158   ALBUMIN 3.5 05/13/2017 0927   AST 30 12/19/2018 1158   AST 14 05/13/2017 0927   ALT 16 12/19/2018 1158   ALT 12 05/13/2017 0927   ALKPHOS 54 12/19/2018 1158   ALKPHOS 90 05/13/2017 0927   BILITOT 0.4 12/19/2018 1158   BILITOT 0.27 05/13/2017 0927   GFRNONAA 41 (L) 12/19/2018 1158   GFRNONAA 34 (L) 09/18/2018 0911   GFRAA 48 (L) 12/19/2018 1158   GFRAA 39 (L) 09/18/2018 0911    No results found for: TOTALPROTELP, ALBUMINELP, A1GS, A2GS, BETS, BETA2SER, GAMS, MSPIKE, SPEI  No results found for: Ron Parker, [facility]  Lab Results  Component Value Date   WBC 6.0 12/19/2018   NEUTROABS 3.4 12/19/2018   HGB 13.0 12/19/2018   HCT 39.4 12/19/2018   MCV 96.1 12/19/2018   PLT 226 12/19/2018      Chemistry      Component Value Date/Time   NA 138 12/19/2018 1158   NA 140 05/13/2017 0927   K 3.8 12/19/2018 1158   K 4.1 05/13/2017 0927   CL 101 12/19/2018 1158   CO2 27 12/19/2018 1158   CO2 29 05/13/2017 0927   BUN 14 12/19/2018 1158   BUN 17.7 05/13/2017 0927   CREATININE 1.37 (H) 12/19/2018 1158   CREATININE 1.62 (H) 09/18/2018 0911   CREATININE 1.4 (H) 05/13/2017 0927   GLU 104 02/13/2016 1358       Component Value Date/Time   CALCIUM 10.3 12/19/2018 1158   CALCIUM 10.5 (H) 05/13/2017 0927   ALKPHOS 54 12/19/2018 1158   ALKPHOS 90 05/13/2017 0927   AST 30 12/19/2018 1158   AST 14 05/13/2017 0927   ALT 16 12/19/2018 1158   ALT 12 05/13/2017 0927   BILITOT 0.4 12/19/2018 1158   BILITOT 0.27 05/13/2017 0927       Lab Results  Component Value Date   LABCA2 11 04/20/2008    No components found for: [id number]  No results for input(s): INR in the last 168 hours.  Urinalysis    Component Value Date/Time   COLORURINE YELLOW 05/30/2018 1123   APPEARANCEUR CLEAR 05/30/2018 1123   LABSPEC 1.017 05/30/2018 1123   PHURINE 6.0 05/30/2018 1123   GLUCOSEU NEGATIVE 05/30/2018 1123   GLUCOSEU NEG mg/dL 29/52/8413 2440   HGBUR NEGATIVE 05/30/2018 1123   BILIRUBINUR NEGATIVE 05/30/2018 1123   KETONESUR NEGATIVE 05/30/2018 1123   PROTEINUR NEGATIVE 05/30/2018 1123   UROBILINOGEN 1 07/23/2006  2113   NITRITE NEGATIVE 05/30/2018 1123   LEUKOCYTESUR NEGATIVE 05/30/2018 1123     STUDIES: Nm Pet Image Restag (ps) Skull Base To Thigh  Result Date: 11/28/2018 CLINICAL DATA:  Subsequent treatment strategy for RIGHT breast carcinoma. EXAM: NUCLEAR MEDICINE PET SKULL BASE TO THIGH TECHNIQUE: 12.7 mCi F-18 FDG was injected intravenously. Full-ring PET imaging was performed from the skull base to thigh after the radiotracer. CT data was obtained and used for attenuation correction and anatomic localization. Fasting blood glucose: 93 mg/dl COMPARISON:  11/91/4782 FINDINGS: Mediastinal blood pool activity: SUV max 3.1 Liver activity: SUV max NA NECK: No hypermetabolic lymph nodes in the neck. Incidental CT findings: none CHEST: Marked interval increase in hypermetabolic mediastinal lymphadenopathy. Bulky subcarinal and RIGHT paratracheal nodes with SUV max equal 15.3. Hypermetabolic nodes extend to the high RIGHT paratracheal / inferior supraclavicular nodal stations on the RIGHT. There is a  hypermetabolic lymph node positioned between the clavicle heads with SUV max equal 10.7. There is increased hypermetabolic pleural thickening involving the near entirety of the RIGHT hemithorax. Activity is increased compared to prior with SUV max equal 16.3 anteriorly for example compared to 11.3. In the RIGHT middle lobe there peripherally hypermetabolic mass measuring approximately 5 cm SUV max equal 22.3. Previously this lesion is less well-defined measured approximately 3.5 cm SUV max equal 15.5. No evidence of metastatic disease in the LEFT hemithorax. Incidental CT findings: Post RIGHT mastectomy anatomy. Port in the anterior chest wall with tip in distal SVC. ABDOMEN/PELVIS: There is a hypermetabolic lymph node in the gastrohepatic ligament measuring 1.5 cm with SUV max equal 9.3. A second lymph node LEFT of the aorta at the level the renal veins with SUV max equal 12.8. No liver metastasis. Incidental CT findings: Uterus and ovaries normal SKELETON: Hypermetabolic focus within RIGHT pedicle of the T1 vertebral body with SUV max equal 7.2.There is subtle lucency on the CT portion exam (image 46/). Incidental CT findings: None IMPRESSION: 1. Unfortunately there is progression of metastatic breast cancer in the RIGHT hemithorax. Interval increase in the rind of hypermetabolic pleural thickening in the RIGHT hemithorax. 2. Interval increase in size of RIGHT middle lobe mass which measures up to 5 cm with intense peripheral metabolic activity. 3. Marked increase in hypermetabolic nodal metastasis in the subcarinal, RIGHT paratracheal and RIGHT supraclavicular nodal stations. 4. Concern for solitary skeletal metastasis to the RIGHT pedicle of the T1 vertebral body. Electronically Signed   By: Genevive Bi M.D.   On: 11/28/2018 12:33      ELIGIBLE FOR AVAILABLE RESEARCH PROTOCOL: no   ASSESSMENT: 63 y.o. [place] woman   (1) status post right lumpectomy and sentinel lymph node sampling October 2005  for a 0.6 cm invasive ductal carcinoma involving one out of 2 sentinel lymph nodes sampled, grade 3, triple-negative, treated adjuvantly with doxorubicin and cyclophosphamide 4 followed by weekly paclitaxel 7, followed by adjuvant radiation  RECURRENT DISEASE: (2) status post right breast upper outer quadrant biopsy and right axillary lymph node biopsy 10/01/2016, both positive for a T2 N1, stage IIIB invasive ductal carcinoma, triple negative, with an MIB-1 of 50-70%  (3) status post right modified radical mastectomy 11/05/2016 showing a pT2 pN1, stage IIIB invasive ductal carcinoma, grade 3, triple negative, with negative margins  (4) not a candidate for radiation given prior history  (5) adjuvant chemotherapy consisting of carboplatin and gemcitabine given days 1 and 8 of each 21 day cycle, for 6 cycles, starting 11/20/2016, completed 03/11/2017  (a) day 8 cycle  2 omitted because of neutropenia; Neupogen/Neulasta added  (5) HIV positivity: under care of Id Zena[facility] [place])  (6) Genetic testing 06/17/2017:  no pathogenic mutations. Genes tested: APC, ATM, AXIN2, BARD1, BLM, BMPR1A, BRCA1, BRCA2, BRIP1, CDH1, CDK4, CDKN2A (p14ARF), CDKN2A (p16INK4a), CEBPA, CHEK2, CTNNA1, DICER1, EPCAM*, GATA2, GREM1*, HRAS, KIT, MEN1, MLH1, MSH2, MSH3, MSH6, MUTYH, NBN, NF1, PALB2, PDGFRA, PMS2, POLD1, POLE, PTEN, RAD50, RAD51C, RAD51D, RUNX1, SDHB, SDHC, SDHD, SMAD4, SMARCA4, STK11, TERC, TERT, TP53, TSC1, TSC2, VHL. The following genes were evaluated for sequence changes only: HOXB13*, NTHL1*, SDHA.   (a) A variant of uncertain significance (VUS) in a gene called NTHL1 was also noted. c.736G>A (p.Ala246Thr)  METASTATIC DISEASE: November 2019 (1) Patient seen in urgent care and ultimately ED on 10/21 for shortness of breath, chest xray demonstrated Right pleural effusion.   (a) right thoracenteses on 10/21 and 11/4 results show atypical cells, non diagnostic (b) CT chest 04/22/2018 shows re-accumulation of fluid,  and right pleural nodularity. (c) PET scan on 05/01/2018 shows hypermetabolic pleural based metastases, right CP angle nodal metastases, no evidence of malignancy in abdomen and pelvis. (d) bronchoscopy with biopsy by Dr. Delton Coombes and BAL on 05/15/2018 was non diagnostic  (e) VAT biopsy of the right pleura 05/30/2018 confirms carcinoma, again triple negative  (f) [facility] shows PD-L1 positive (1% in [facility]); otherwise microsatellite stable, TMB low (5/Mb), no PIK3 mutations; other mutations suggest sensitivity to MTOR inhibitors and several TKIs  (2) Atezolizumab started 07/15/2018 given every other week  (a) PET 08/13/2018 shows tumor Right hemithorax (new baseline study)  (b) Atezo changed to Q4w starting with 11/07/2018 dose  (c) PET 11/28/2018 documents progression in the right lung and pleural area as well as lymph nodes, and a T1 skeletal metastasis  (d) atezolizumab discontinued after 12/05/2018 dose  (3) eribulin day 1 and 8 of every 21 day cycle started 12/19/2018  PLAN:  Silka is finally ready to start her new agent, eribulin, and we discussed the possible toxicities side effects and complications.  I am concerned that she may have worsening of her neuropathy and we reviewed her baseline, which is grade 1.  She understands the risk of low counts, and concerns regarding nausea, vomiting, mouth sores, and of course hair loss as well.  For nausea she will use Compazine as needed and she also has dexamethasone and I explained to her exactly how to take that.  She is aware there is an interaction with some of her other medications  Her pain is poorly controlled.  I suggested she take the Norco at least 3 times a day, peripheral before, so long as it does not constipate, confused, or nauseate her.  If she does get some constipation she increases the softeners and may take MiraLAX as needed.  I am hopeful that with that she will be able to move around a little bit more easily.  She will  call us with any issues but otherwise I will see her again in 7 days, with her day 8 treatment, and we will review any unusual side effects complications or other concerns at that time   Jamere Stidham, Valentino Hue, MD  12/19/18 1:22 PM Medical Oncology and Hematology [facility] [address] [place], [place] [address] Tel. [phone]    Fax. [phone]  I, Mal Misty am acting as a Neurosurgeon for Lowella Dell, MD.   I, Ruthann Cancer MD, have reviewed the above documentation for accuracy and completeness, and I agree with the above.

## 2018-12-19 ENCOUNTER — Inpatient Hospital Stay: Payer: Medicare HMO

## 2018-12-19 ENCOUNTER — Other Ambulatory Visit: Payer: Self-pay

## 2018-12-19 ENCOUNTER — Inpatient Hospital Stay (HOSPITAL_BASED_OUTPATIENT_CLINIC_OR_DEPARTMENT_OTHER): Payer: Medicare HMO | Admitting: Oncology

## 2018-12-19 VITALS — BP 116/77 | HR 100 | Temp 98.5°F | Resp 18 | Ht 67.0 in | Wt 243.6 lb

## 2018-12-19 DIAGNOSIS — Z79899 Other long term (current) drug therapy: Secondary | ICD-10-CM

## 2018-12-19 DIAGNOSIS — C50411 Malignant neoplasm of upper-outer quadrant of right female breast: Secondary | ICD-10-CM

## 2018-12-19 DIAGNOSIS — Z5111 Encounter for antineoplastic chemotherapy: Secondary | ICD-10-CM | POA: Diagnosis not present

## 2018-12-19 DIAGNOSIS — Z95828 Presence of other vascular implants and grafts: Secondary | ICD-10-CM

## 2018-12-19 DIAGNOSIS — C7801 Secondary malignant neoplasm of right lung: Secondary | ICD-10-CM | POA: Diagnosis not present

## 2018-12-19 DIAGNOSIS — C50911 Malignant neoplasm of unspecified site of right female breast: Secondary | ICD-10-CM

## 2018-12-19 DIAGNOSIS — Z171 Estrogen receptor negative status [ER-]: Secondary | ICD-10-CM

## 2018-12-19 DIAGNOSIS — E119 Type 2 diabetes mellitus without complications: Secondary | ICD-10-CM | POA: Diagnosis not present

## 2018-12-19 DIAGNOSIS — I10 Essential (primary) hypertension: Secondary | ICD-10-CM

## 2018-12-19 DIAGNOSIS — N183 Chronic kidney disease, stage 3 (moderate): Secondary | ICD-10-CM | POA: Diagnosis not present

## 2018-12-19 DIAGNOSIS — E039 Hypothyroidism, unspecified: Secondary | ICD-10-CM | POA: Diagnosis not present

## 2018-12-19 DIAGNOSIS — Z5112 Encounter for antineoplastic immunotherapy: Secondary | ICD-10-CM | POA: Diagnosis not present

## 2018-12-19 DIAGNOSIS — M1990 Unspecified osteoarthritis, unspecified site: Secondary | ICD-10-CM

## 2018-12-19 DIAGNOSIS — C7951 Secondary malignant neoplasm of bone: Secondary | ICD-10-CM

## 2018-12-19 DIAGNOSIS — Z9011 Acquired absence of right breast and nipple: Secondary | ICD-10-CM | POA: Diagnosis not present

## 2018-12-19 DIAGNOSIS — C787 Secondary malignant neoplasm of liver and intrahepatic bile duct: Secondary | ICD-10-CM

## 2018-12-19 LAB — CBC WITH DIFFERENTIAL (CANCER CENTER ONLY)
Abs Immature Granulocytes: 0.01 10*3/uL (ref 0.00–0.07)
Basophils Absolute: 0.1 10*3/uL (ref 0.0–0.1)
Basophils Relative: 1 %
Eosinophils Absolute: 0.3 10*3/uL (ref 0.0–0.5)
Eosinophils Relative: 5 %
HCT: 39.4 % (ref 36.0–46.0)
Hemoglobin: 13 g/dL (ref 12.0–15.0)
Immature Granulocytes: 0 %
Lymphocytes Relative: 27 %
Lymphs Abs: 1.6 10*3/uL (ref 0.7–4.0)
MCH: 31.7 pg (ref 26.0–34.0)
MCHC: 33 g/dL (ref 30.0–36.0)
MCV: 96.1 fL (ref 80.0–100.0)
Monocytes Absolute: 0.6 10*3/uL (ref 0.1–1.0)
Monocytes Relative: 10 %
Neutro Abs: 3.4 10*3/uL (ref 1.7–7.7)
Neutrophils Relative %: 57 %
Platelet Count: 226 10*3/uL (ref 150–400)
RBC: 4.1 MIL/uL (ref 3.87–5.11)
RDW: 13.1 % (ref 11.5–15.5)
WBC Count: 6 10*3/uL (ref 4.0–10.5)
nRBC: 0 % (ref 0.0–0.2)

## 2018-12-19 LAB — CMP (CANCER CENTER ONLY)
ALT: 16 U/L (ref 0–44)
AST: 30 U/L (ref 15–41)
Albumin: 3.6 g/dL (ref 3.5–5.0)
Alkaline Phosphatase: 54 U/L (ref 38–126)
Anion gap: 10 (ref 5–15)
BUN: 14 mg/dL (ref 8–23)
CO2: 27 mmol/L (ref 22–32)
Calcium: 10.3 mg/dL (ref 8.9–10.3)
Chloride: 101 mmol/L (ref 98–111)
Creatinine: 1.37 mg/dL — ABNORMAL HIGH (ref 0.44–1.00)
GFR, Est AFR Am: 48 mL/min — ABNORMAL LOW (ref >60)
GFR, Estimated: 41 mL/min — ABNORMAL LOW (ref >60)
Glucose, Bld: 86 mg/dL (ref 70–99)
Potassium: 3.8 mmol/L (ref 3.5–5.1)
Sodium: 138 mmol/L (ref 135–145)
Total Bilirubin: 0.4 mg/dL (ref 0.3–1.2)
Total Protein: 8.3 g/dL — ABNORMAL HIGH (ref 6.5–8.1)

## 2018-12-19 LAB — TSH: TSH: 5.265 u[IU]/mL — ABNORMAL HIGH (ref 0.308–3.960)

## 2018-12-19 MED ORDER — DEXAMETHASONE 4 MG PO TABS: 4.0000 mg | ORAL_TABLET | Freq: Two times a day (BID) | ORAL | 6 refills | Status: DC | PRN

## 2018-12-19 MED ORDER — PROCHLORPERAZINE MALEATE 10 MG PO TABS
10.0000 mg | ORAL_TABLET | Freq: Once | ORAL | Status: AC
  Administered 2018-12-19: 10 mg via ORAL

## 2018-12-19 MED ORDER — SODIUM CHLORIDE 0.9 % IV SOLN
Freq: Once | INTRAVENOUS | Status: AC
  Administered 2018-12-19: 14:00:00 via INTRAVENOUS
  Filled 2018-12-19: qty 250

## 2018-12-19 MED ORDER — SODIUM CHLORIDE 0.9 % IV SOLN
1.2000 mg/m2 | Freq: Once | INTRAVENOUS | Status: AC
  Administered 2018-12-19: 2.75 mg via INTRAVENOUS
  Filled 2018-12-19: qty 5.5

## 2018-12-19 MED ORDER — SODIUM CHLORIDE 0.9% FLUSH
10.0000 mL | Freq: Once | INTRAVENOUS | Status: AC
  Administered 2018-12-19: 10 mL
  Filled 2018-12-19: qty 10

## 2018-12-19 MED ORDER — PROCHLORPERAZINE MALEATE 10 MG PO TABS
ORAL_TABLET | ORAL | Status: AC
  Filled 2018-12-19: qty 1

## 2018-12-19 MED ORDER — HYDROCODONE-ACETAMINOPHEN 5-325 MG PO TABS: ORAL_TABLET | ORAL | 0 refills | Status: DC

## 2018-12-19 MED ORDER — SODIUM CHLORIDE 0.9% FLUSH
10.0000 mL | INTRAVENOUS | Status: DC | PRN
  Administered 2018-12-19: 10 mL
  Filled 2018-12-19: qty 10

## 2018-12-19 MED ORDER — PROCHLORPERAZINE MALEATE 10 MG PO TABS: 10.0000 mg | ORAL_TABLET | Freq: Four times a day (QID) | ORAL | 4 refills | Status: DC | PRN

## 2018-12-19 MED ORDER — HEPARIN SOD (PORK) LOCK FLUSH 100 UNIT/ML IV SOLN
500.0000 [IU] | Freq: Once | INTRAVENOUS | Status: AC | PRN
  Administered 2018-12-19: 500 [IU]
  Filled 2018-12-19: qty 5

## 2018-12-19 MED FILL — DEXAMETHASONE 4 MG TABLET: 4 | 10 days supply | Qty: 40 | Fill #0

## 2018-12-19 MED FILL — PROCHLORPERAZINE 10 MG TAB: 10 | 7 days supply | Qty: 30 | Fill #0

## 2018-12-19 NOTE — Patient Instructions (Signed)
[facility] Discharge Instructions for Patients Receiving Chemotherapy  Today you received the following chemotherapy agents Halaven   To help prevent nausea and vomiting after your treatment, we encourage you to take your nausea medication as directed  If you develop nausea and vomiting that is not controlled by your nausea medication, call the clinic.   BELOW ARE SYMPTOMS THAT SHOULD BE REPORTED IMMEDIATELY:  *FEVER GREATER THAN 100.5 F  *CHILLS WITH OR WITHOUT FEVER  NAUSEA AND VOMITING THAT IS NOT CONTROLLED WITH YOUR NAUSEA MEDICATION  *UNUSUAL SHORTNESS OF BREATH  *UNUSUAL BRUISING OR BLEEDING  TENDERNESS IN MOUTH AND THROAT WITH OR WITHOUT PRESENCE OF ULCERS  *URINARY PROBLEMS  *BOWEL PROBLEMS  UNUSUAL RASH Items with * indicate a potential emergency and should be followed up as soon as possible.  Feel free to call the clinic you have any questions or concerns. The clinic phone number is [phone].  Eribulin solution for injection What is this medicine? ERIBULIN (er e bu lin) is a chemotherapy drug. It is used to treat breast cancer and liposarcoma. This medicine may be used for other purposes; ask your health care provider or pharmacist if you have questions. COMMON BRAND NAME(S): Halaven What should I tell my health care provider before I take this medicine? They need to know if you have any of these conditions: -heart disease -history of irregular heartbeat -kidney disease -liver disease -low blood counts, like low white cell, platelet, or red cell counts -low levels of potassium or magnesium in the blood -an unusual or allergic reaction to eribulin, other medicines, foods, dyes, or preservatives -pregnant or trying to get pregnant -breast-feeding How should I use this medicine? This medicine is for infusion into a vein. It is given by a health care professional in a hospital or clinic setting. Talk to your pediatrician regarding the use  of this medicine in children. Special care may be needed. Overdosage: If you think you have taken too much of this medicine contact a poison control center or emergency room at once. NOTE: This medicine is only for you. Do not share this medicine with others. What if I miss a dose? It is important not to miss your dose. Call your doctor or health care professional if you are unable to keep an appointment. What may interact with this medicine? Do not take this medicine with any of the following medications: -amiodarone -astemizole -arsenic trioxide -bepridil -bretylium -chloroquine -chlorpromazine -cisapride -clarithromycin -dextromethorphan, quinidine -disopyramide -dofetilide -droperidol -dronedarone -erythromycin -grepafloxacin -halofantrine -haloperidol -ibutilide -levomethadyl -mesoridazine -methadone -pentamidine -procainamide -quinidine -pimozide -posaconazole -probucol -propafenone -saquinavir -sotalol -sparfloxacin -terfenadine -thioridazine -troleandomycin -ziprasidone This list may not describe all possible interactions. Give your health care provider a list of all the medicines, herbs, non-prescription drugs, or dietary supplements you use. Also tell them if you smoke, drink alcohol, or use illegal drugs. Some items may interact with your medicine. What should I watch for while using this medicine? This drug may make you feel generally unwell. This is not uncommon, as chemotherapy can affect healthy cells as well as cancer cells. Report any side effects. Continue your course of treatment even though you feel ill unless your doctor tells you to stop. Call your doctor or health care professional for advice if you get a fever, chills or sore throat, or other symptoms of a cold or flu. Do not treat yourself. This drug decreases your body's ability to fight infections. Try to avoid being around people who are sick. This medicine may  increase your risk to bruise or  bleed. Call your doctor or health care professional if you notice any unusual bleeding. You may need blood work done while you are taking this medicine. Do not become pregnant while taking this medicine or for 2 weeks after stopping it. Women should inform their doctor if they wish to become pregnant or think they might be pregnant. Men should not father a child while taking this medicine and for 3.5 months after stopping it. There is a potential for serious side effects to an unborn child. Talk to your health care professional or pharmacist for more information. Do not breast-feed an infant while taking this medicine or for 2 weeks after stopping it. What side effects may I notice from receiving this medicine? Side effects that you should report to your doctor or health care professional as soon as possible: -allergic reactions like skin rash, itching or hives, swelling of the face, lips, or tongue -low blood counts - this medicine may decrease the number of white blood cells, red blood cells and platelets. You may be at increased risk for infections and bleeding. -signs of infection - fever or chills, cough, sore throat, pain or difficulty passing urine -signs of decreased platelets or bleeding - bruising, pinpoint red spots on the skin, black, tarry stools, blood in the urine -signs of decreased red blood cells - unusually weak or tired, fainting spells, lightheadedness -pain, tingling, numbness in the hands or feet Side effects that usually do not require medical attention (report to your doctor or health care professional if they continue or are bothersome): -constipation -hair loss -headache -loss of appetite -muscle or joint pain -nausea, vomiting -stomach pain This list may not describe all possible side effects. Call your doctor for medical advice about side effects. You may report side effects to FDA at 1-800-FDA-1088. Where should I keep my medicine? This drug is given in a hospital  or clinic and will not be stored at home. NOTE: This sheet is a summary. It may not cover all possible information. If you have questions about this medicine, talk to your doctor, pharmacist, or health care provider.  2019 [employer] Standard (2015-07-14 10:11:26)

## 2018-12-20 LAB — CANCER ANTIGEN 27.29: CA 27.29: 55.9 U/mL — ABNORMAL HIGH (ref 0.0–38.6)

## 2018-12-22 ENCOUNTER — Ambulatory Visit
Admission: RE | Admit: 2018-12-22 | Discharge: 2018-12-22 | Disposition: A | Discharge status: Home / Self Care | Payer: Medicare HMO | Source: Ambulatory Visit | Attending: Oncology | Admitting: Oncology

## 2018-12-22 ENCOUNTER — Other Ambulatory Visit: Payer: Self-pay

## 2018-12-22 DIAGNOSIS — Z1231 Encounter for screening mammogram for malignant neoplasm of breast: Secondary | ICD-10-CM | POA: Diagnosis not present

## 2018-12-22 DIAGNOSIS — Z853 Personal history of malignant neoplasm of breast: Secondary | ICD-10-CM | POA: Diagnosis not present

## 2018-12-22 MED FILL — HYDROCODON-APAP 5-325: 5-325 | 30 days supply | Qty: 120 | Fill #0

## 2018-12-23 ENCOUNTER — Other Ambulatory Visit: Payer: Self-pay | Admitting: Oncology

## 2018-12-23 DIAGNOSIS — R928 Other abnormal and inconclusive findings on diagnostic imaging of breast: Secondary | ICD-10-CM

## 2018-12-23 NOTE — Progress Notes (Unsigned)
Chemo f/u call. LVM for pt to return call.

## 2018-12-23 NOTE — Progress Notes (Deleted)
Chemo f/u call to pt. LVM for pt to return call.

## 2018-12-24 ENCOUNTER — Telehealth: Payer: Self-pay | Admitting: Pharmacy Technician

## 2018-12-24 MED FILL — JULUCA 50-25 MG TAB: 50-25 | 30 days supply | Qty: 30 | Fill #2

## 2018-12-24 MED FILL — PREZCOBIX 800 MG-150 MG TAB: 800-150 | 30 days supply | Qty: 30 | Fill #2

## 2018-12-24 NOTE — Progress Notes (Unsigned)
Chemo f/u call. Pt reports no issues.

## 2018-12-24 NOTE — Telephone Encounter (Addendum)
[facility] Patient Advocate Encounter   Received notification from [facility] that prior authorization for Juluca is required because of a drug to drug interaction with Dexamethasone prescribed by Dr. Lurline Del.  Dr. Virgie Dad office notes were attached.   PA submitted on 12/24/2018 Approved 12/24/2018 through 06/25/2019 [employer] [id number] Customer care team phone number is [phone].      Venida Jarvis. Nadara Mustard [facility] Patient [facility] Phone: [phone] Fax:  [phone]

## 2018-12-25 ENCOUNTER — Ambulatory Visit
Admission: RE | Admit: 2018-12-25 | Discharge: 2018-12-25 | Disposition: A | Discharge status: Home / Self Care | Payer: Medicare HMO | Source: Ambulatory Visit | Attending: Oncology | Admitting: Oncology

## 2018-12-25 ENCOUNTER — Other Ambulatory Visit: Payer: Self-pay

## 2018-12-25 ENCOUNTER — Ambulatory Visit: Payer: Medicare HMO

## 2018-12-25 DIAGNOSIS — Z853 Personal history of malignant neoplasm of breast: Secondary | ICD-10-CM | POA: Diagnosis not present

## 2018-12-25 DIAGNOSIS — R928 Other abnormal and inconclusive findings on diagnostic imaging of breast: Secondary | ICD-10-CM | POA: Diagnosis not present

## 2019-01-02 ENCOUNTER — Encounter: Payer: Self-pay | Admitting: Adult Health

## 2019-01-02 ENCOUNTER — Inpatient Hospital Stay: Payer: Medicare HMO | Attending: Adult Health

## 2019-01-02 ENCOUNTER — Other Ambulatory Visit: Payer: Self-pay | Admitting: Oncology

## 2019-01-02 ENCOUNTER — Other Ambulatory Visit: Payer: Self-pay

## 2019-01-02 ENCOUNTER — Inpatient Hospital Stay (HOSPITAL_BASED_OUTPATIENT_CLINIC_OR_DEPARTMENT_OTHER): Payer: Medicare HMO | Admitting: Adult Health

## 2019-01-02 ENCOUNTER — Inpatient Hospital Stay: Payer: Medicare HMO

## 2019-01-02 ENCOUNTER — Other Ambulatory Visit: Payer: Self-pay | Admitting: Infectious Disease

## 2019-01-02 VITALS — BP 114/75 | HR 81 | Temp 97.8°F | Resp 18 | Ht 67.0 in | Wt 244.6 lb

## 2019-01-02 DIAGNOSIS — C782 Secondary malignant neoplasm of pleura: Secondary | ICD-10-CM | POA: Diagnosis not present

## 2019-01-02 DIAGNOSIS — C50411 Malignant neoplasm of upper-outer quadrant of right female breast: Secondary | ICD-10-CM

## 2019-01-02 DIAGNOSIS — Z21 Asymptomatic human immunodeficiency virus [HIV] infection status: Secondary | ICD-10-CM | POA: Insufficient documentation

## 2019-01-02 DIAGNOSIS — Z79899 Other long term (current) drug therapy: Secondary | ICD-10-CM | POA: Insufficient documentation

## 2019-01-02 DIAGNOSIS — C7801 Secondary malignant neoplasm of right lung: Secondary | ICD-10-CM | POA: Insufficient documentation

## 2019-01-02 DIAGNOSIS — Z923 Personal history of irradiation: Secondary | ICD-10-CM | POA: Insufficient documentation

## 2019-01-02 DIAGNOSIS — Z9011 Acquired absence of right breast and nipple: Secondary | ICD-10-CM | POA: Diagnosis not present

## 2019-01-02 DIAGNOSIS — C7951 Secondary malignant neoplasm of bone: Secondary | ICD-10-CM | POA: Insufficient documentation

## 2019-01-02 DIAGNOSIS — Z95828 Presence of other vascular implants and grafts: Secondary | ICD-10-CM

## 2019-01-02 DIAGNOSIS — Z171 Estrogen receptor negative status [ER-]: Secondary | ICD-10-CM | POA: Insufficient documentation

## 2019-01-02 DIAGNOSIS — Z5111 Encounter for antineoplastic chemotherapy: Secondary | ICD-10-CM | POA: Diagnosis not present

## 2019-01-02 DIAGNOSIS — D701 Agranulocytosis secondary to cancer chemotherapy: Secondary | ICD-10-CM | POA: Insufficient documentation

## 2019-01-02 LAB — CMP (CANCER CENTER ONLY)
ALT: 18 U/L (ref 0–44)
AST: 24 U/L (ref 15–41)
Albumin: 3.5 g/dL (ref 3.5–5.0)
Alkaline Phosphatase: 56 U/L (ref 38–126)
Anion gap: 10 (ref 5–15)
BUN: 13 mg/dL (ref 8–23)
CO2: 28 mmol/L (ref 22–32)
Calcium: 9.7 mg/dL (ref 8.9–10.3)
Chloride: 102 mmol/L (ref 98–111)
Creatinine: 1.44 mg/dL — ABNORMAL HIGH (ref 0.44–1.00)
GFR, Est AFR Am: 45 mL/min — ABNORMAL LOW (ref >60)
GFR, Estimated: 39 mL/min — ABNORMAL LOW (ref >60)
Glucose, Bld: 90 mg/dL (ref 70–99)
Potassium: 3.4 mmol/L — ABNORMAL LOW (ref 3.5–5.1)
Sodium: 140 mmol/L (ref 135–145)
Total Bilirubin: 0.3 mg/dL (ref 0.3–1.2)
Total Protein: 7.9 g/dL (ref 6.5–8.1)

## 2019-01-02 LAB — CBC WITH DIFFERENTIAL (CANCER CENTER ONLY)
Abs Immature Granulocytes: 0.01 10*3/uL (ref 0.00–0.07)
Basophils Absolute: 0.1 10*3/uL (ref 0.0–0.1)
Basophils Relative: 2 %
Eosinophils Absolute: 0.1 10*3/uL (ref 0.0–0.5)
Eosinophils Relative: 3 %
HCT: 39.1 % (ref 36.0–46.0)
Hemoglobin: 12.9 g/dL (ref 12.0–15.0)
Immature Granulocytes: 0 %
Lymphocytes Relative: 44 %
Lymphs Abs: 1.3 10*3/uL (ref 0.7–4.0)
MCH: 31.6 pg (ref 26.0–34.0)
MCHC: 33 g/dL (ref 30.0–36.0)
MCV: 95.8 fL (ref 80.0–100.0)
Monocytes Absolute: 0.8 10*3/uL (ref 0.1–1.0)
Monocytes Relative: 27 %
Neutro Abs: 0.7 10*3/uL — ABNORMAL LOW (ref 1.7–7.7)
Neutrophils Relative %: 24 %
Platelet Count: 280 10*3/uL (ref 150–400)
RBC: 4.08 MIL/uL (ref 3.87–5.11)
RDW: 13.5 % (ref 11.5–15.5)
WBC Count: 3 10*3/uL — ABNORMAL LOW (ref 4.0–10.5)
nRBC: 0 % (ref 0.0–0.2)

## 2019-01-02 MED ORDER — TBO-FILGRASTIM 480 MCG/0.8ML ~~LOC~~ SOSY
PREFILLED_SYRINGE | SUBCUTANEOUS | Status: AC
  Filled 2019-01-02: qty 0.8

## 2019-01-02 MED ORDER — SODIUM CHLORIDE 0.9% FLUSH
10.0000 mL | Freq: Once | INTRAVENOUS | Status: AC
  Administered 2019-01-02: 10 mL
  Filled 2019-01-02: qty 10

## 2019-01-02 MED ORDER — TBO-FILGRASTIM 480 MCG/0.8ML ~~LOC~~ SOSY
480.0000 ug | PREFILLED_SYRINGE | Freq: Once | SUBCUTANEOUS | Status: AC
  Administered 2019-01-02: 480 ug via SUBCUTANEOUS

## 2019-01-02 NOTE — Progress Notes (Signed)
[place]  [phone] [phone]    ID: Kayla Price DOB: [birth year]  MR#: [id number]  LKH#:[id number]  Patient Care Team: Lauree Chandler, NP as PCP - General (Nurse Practitioner) Tommy Medal, Lavell Islam, MD as PCP - Infectious Diseases (Infectious Diseases) Clent Jacks, MD as Consulting Physician (Ophthalmology) Renato Shin, MD as Consulting Physician (Endocrinology) Frederik Pear, MD as Consulting Physician (Orthopedic Surgery) Magrinat, Virgie Dad, MD as Consulting Physician (Oncology) Fanny Skates, MD as Consulting Physician (General Surgery) Donnamae Jude, MD as Consulting Physician (Obstetrics and Gynecology) Delice Bison, Charlestine Massed, NP as Nurse Practitioner (Hematology and Oncology) Webb Laws, [place] as Referring Physician (Optometry) OTHER MD:   CHIEF COMPLAINT: Triple negative breast cancer, recurrent  CURRENT TREATMENT:  eribulin  INTERVAL HISTORY: Kayla Price returns today for follow-up and treatment of her recurrent triple negative breast cancer.  She had been on atezolizumab. She tolerated that well but there was evidence of disease progression. She is receiving Eribulin.  Today is cycle 2 day 1.  She received only one treatment two weeks ago.       REVIEW OF SYSTEMS: Kayla Price notes that she has been feeling well since she started the chemotherapy.  Her pain is improved, and she has been taking her pain medications just prior to feeling pain and the pain is substantially better.  She is more functional, and she has not had as much constipation.  Kayla Price notes her energy is better and she has no other concerns.  Kayla Price is keeping appropriate pandemic precautions.  She is without fever, chills, chest pain, palpitations, cough, shortness of breath.  She has no nausea, vomiting, bowel/bladder changes, headaches, vision changes.  A detailed ROS was otherwise non contributory.     BREAST CANCER HISTORY: From the original  intake note:  Kayla Price is a history of right-sided breast cancer dating back to 2005. She had a lumpectomy with sentinel lymph node sampling, chemotherapy, and radiation. I do not have access to those records at present.  More recently she had bilateral screening mammography at the [facility] 09/25/2016 showing a possible mass in the right breast. Diagnostic mammography with ultrasonography on 09/28/2016 the patient underwent right diagnostic mammography with tomography and right breast ultrasonography. The breast density was category A. In the right breast at the 10:00 position there was an irregular mass measuring 2.5 cm. Ultrasound identified this the 10:00 radiant 10 cm from the nipple measuring 2.4 cm. In the right axilla there was an abnormal lymph node measuring 1.3 cm with other normal-appearing lymph nodes.  On 10/01/2016 she  underwent biopsy of the right breast mass in question as well as the suspicious axillary lymph node. 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 The proliferation marker was 70% in the breast lesion and 50% in the lymph node.  Her subsequent history is as detailed below.    PAST MEDICAL HISTORY: Past Medical History:  Diagnosis Date   Alopecia areata 11/28/2009   Bell's palsy    Cancer ([place]) 2005   Breast cancer   chemotherapy and radiation   CKD (chronic kidney disease) stage 3, GFR 30-59 ml/min (HCC) 06/08/2015   Dry eye syndrome    Family history of lung cancer    Family history of non-Hodgkin's lymphoma    Fasting hyperglycemia    Gestational diabetes    2001   HIP FRACTURE, RIGHT 05/06/2008  History of kidney stones    HIV DISEASE 03/27/2006   HYPERLIPIDEMIA, MIXED 12/15/2007   HYPERTENSION 03/27/2006   HYPOTHYROIDISM, POST-RADIATION 06/28/2008   MENORRHAGIA, POSTMENOPAUSAL 02/03/2009    Osteoarthritis of left knee 06/08/2015   OSTEOARTHROSIS, LOCAL, SCND, UNSPC SITE 04/07/2007   Personal history of chemotherapy 11/2018   PVD 04/07/2007   Tinea capitis    TRIGGER FINGER 05/06/2008   Unspecified vitamin D deficiency 08/06/2007    PAST SURGICAL HISTORY: Past Surgical History:  Procedure Laterality Date   BREAST LUMPECTOMY Right    2005   CHEST TUBE INSERTION Right 05/30/2018   Procedure: INSERTION PLEURAL DRAINAGE CATHETER;  Surgeon: Grace Isaac, MD;  Location[facility] OR;  Service: Thoracic;  Laterality: Right;   COLONOSCOPY     COLONOSCOPY WITH ESOPHAGOGASTRODUODENOSCOPY (EGD)     ENDOBRONCHIAL ULTRASOUND Bilateral 05/15/2018   Procedure: ENDOBRONCHIAL ULTRASOUND;  Surgeon: Collene Gobble, MD;  Location: [facility] ENDOSCOPY;  Service: Cardiopulmonary;  Laterality: Bilateral;   FINE NEEDLE ASPIRATION BIOPSY  05/15/2018   Procedure: FINE NEEDLE ASPIRATION BIOPSY;  Surgeon: Collene Gobble, MD;  Location: [facility] ENDOS[facility];  Service: Cardiopulmonary;;   FLEXIBLE BRONCHOSCOPY  05/15/2018   Procedure: FLEXIBLE BRONCHOSCOPY;  Surgeon: Collene Gobble, MD;  Location: [facility];  Service: Cardiopulmonary;;   HYSTEROSCOPY  2006   IR IMAGING GUIDED PORT INSERTION  07/14/2018   IR THORACENTESIS ASP PLEURAL SPACE W/IMG GUIDE  04/14/2018   IR THORACENTESIS ASP PLEURAL SPACE W/IMG GUIDE  04/28/2018   KNEE ARTHROSCOPY Left 2002   LYMPH NODE DISSECTION  2005   MASTECTOMY Right    MASTECTOMY MODIFIED RADICAL Right 11/05/2016   Procedure: RIGHT MASTECTOMY MODIFIED RADICAL;  Surgeon: Fanny Skates, MD;  Location: [facility];  Service: General;  Laterality: Right;   MODIFIED RADICAL MASTECTOMY Right 11/05/2016   placement   of port-a-cath     PLEURAL BIOPSY Right 05/30/2018   Procedure: PLEURAL BIOPSY;  Surgeon: Grace Isaac, MD;  Location: [facility];  Service: Thoracic;  Laterality: Right;   PLEURAL EFFUSION DRAINAGE Right 05/30/2018   Procedure: DRAINAGE OF PLEURAL  EFFUSION;  Surgeon: Grace Isaac, MD;  Location: [place];  Service: Thoracic;  Laterality: Right;   PORT-A-CATH REMOVAL  2006   insertion 2005   PORT-A-CATH REMOVAL N/A 03/29/2017   Procedure: REMOVAL PORT-A-CATH;  Surgeon: Fanny Skates, MD;  Location: [facility];  Service: General;  Laterality: N/A;   PORTACATH PLACEMENT Right 11/05/2016   Procedure: INSERTION PORT-A-CATH WITH ULTRA SOUND;  Surgeon: Fanny Skates, MD;  Location: [facility];  Service: General;  Laterality: Right;   removal of port a cath  12/2014   TALC PLEURODESIS Right 05/30/2018   Procedure: Pietro Cassis;  Surgeon: Grace Isaac, MD;  Location: [facility];  Service: Thoracic;  Laterality: Right;   THYROID SURGERY  2009   Ablation    TOTAL KNEE ARTHROPLASTY Left 02/06/2016   Procedure: TOTAL KNEE ARTHROPLASTY;  Surgeon: Frederik Pear, MD;  Location: [facility];  Service: Orthopedics;  Laterality: Left;   TUBAL LIGATION     VIDEO ASSISTED THORACOSCOPY Right 05/30/2018   Procedure: VIDEO ASSISTED THORACOSCOPY;  Surgeon: Grace Isaac, MD;  Location: [facility] OR;  Service: Thoracic;  Laterality: Right;    FAMILY HISTORY Family History  Problem Relation Age of Onset   Heart attack Brother        Massive MI in 28s   Stroke Brother        CAD   Kidney disease Mother    Stroke Mother  Diabetes Mother    Liver disease Sister    COPD Sister        had I-131 rx of hyperthyroidism   Diabetes Sister    Stroke Sister    Lung cancer Paternal Uncle        hx smoking   Cancer Cousin    Non-Hodgkin's lymphoma Cousin 25       cancer x3, in prostate and lung- unsure if met/spread or if primaries   Heart failure Father    Heart disease Father    Arthritis Father    Sarcoidosis Sister    The patient's father died at the age of 18 in the setting of Alzheimer's disease. The patient's mother died at the age of 27 from complications of diabetes. The patient has 3 brothers, 4 sisters. There is no history of  breast or ovarian cancer in the family area    GYNECOLOGIC HISTORY:  Patient's last menstrual period was 11/06/2003.  menarche age 2, first live birth age 1, the patient is [place] P2. She stopped having periods in 2005, with her chemotherapy; she never took hormone replacement    SOCIAL HISTORY:  Fatim worked for the [employer] more than 20 years. 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 At home she lives with her daughter Orland Dec who works for [employer] and her granddaughter Donita Brooks, 71 y/o as of APRIL 2018.  The patient's son also drops infrequently.  They are all careful regarding pandemic precautions    ADVANCED DIRECTIVES:  not in place    HEALTH MAINTENANCE: Social History   Tobacco Use   Smoking status: Never Smoker   Smokeless tobacco: Never Used  Substance Use Topics   Alcohol use: No   Drug use: No     Colonoscopy:  PAP:  Bone density:   Allergies  Allergen Reactions   Lisinopril Anaphylaxis and Swelling    Swelling of tongue and mouth 11/05/16- tolerates Olmesartan   Pepcid [Famotidine] Other (See Comments)    PPI H2, BLOCKERS LOWER GASTRIC PH WHICH WOULD LEAD TO SUBTHERAPEUTIC RILPIVIRINE LEVELS AND POTENTIAL VIROLOGICAL FAILURE WITH RESISTANCE   Prilosec [Omeprazole] Other (See Comments)    PPI H2, BLOCKERS LOWER GASTRIC PH WHICH WOULD LEAD TO SUBTHERAPEUTIC RILPIVIRINE LEVELS AND POTENTIAL VIROLOGICAL FAILURE WITH RESISTANCE   Tums [Calcium Carbonate Antacid] Other (See Comments)    TUMS ANTACIDS CAN LOWER GASTRIC PH WHICH COULD  LEAD TO SUBTHERAPEUTIC RILPIVIRINE LEVELS AND POTENTIAL VIROLOGICAL FAILURE WITH RESISTANCE TUMS CAN BE GIVEN BUT NEED CONSULT WITH [facility]ARM[facility] RE TIMING. I PREFER HER TO AVOID ALL TOGETHER    Current Outpatient Medications  Medication Sig Dispense Refill   CALCIUM CITRATE-VITAMIN D3 PO Take by mouth. 630 mg of calcium and 13 mcg d3, take once daily     Cholecalciferol (D3  ADULT PO) Take 25 mcg by mouth daily.     Coenzyme Q10 (COQ10) 100 MG CAPS Take by mouth daily.     ELDERBERRY PO Take 50 mg by mouth daily.     HYDROcodone-acetaminophen (NORCO/VICODIN) 5-325 MG tablet Take one tablet 4 times a day as instructed 120 tablet 0   hydrocortisone cream 1 % Apply 1 application topically daily as needed for itching.     JULUCA 50-25 MG TABS TAKE 1 TABLET BY MOUTH DAILY WITH BREAKFAST. TAKE WITH THE PREZCOBIX. 30 tablet 5   levothyroxine (SYNTHROID, LEVOTHROID) 175 MCG tablet Take 1 tablet (175 mcg total) by mouth daily before breakfast. 90 tablet  1   lidocaine-prilocaine (EMLA) cream Apply 1 application topically as needed. Apply to port site 1 hour prior to access 30 g 0   Loratadine 10 MG CAPS Take by mouth as needed.     Menthol, Topical Analgesic, (BENGAY EX) Apply 1 application topically daily as needed (muscle pain).     methocarbamol (ROBAXIN) 500 MG tablet Take 1 tablet (500 mg total) by mouth every 8 (eight) hours as needed for muscle spasms. 90 tablet 1   Methylsulfonylmethane (MSM) 1000 MG TABS Take by mouth daily.     Naphazoline-Glycerin (REDNESS RELIEF OP) Place 1 drop into both eyes daily as needed (redness).     olmesartan-hydrochlorothiazide (BENICAR HCT) 40-25 MG tablet TAKE 1 TABLET BY MOUTH DAILY. 30 tablet 1   Omega-3 Fatty Acids (FISH OIL) 1000 MG CAPS Take by mouth daily.     potassium gluconate 595 (99 K) MG TABS tablet Take 595 mg by mouth daily.     PREZCOBIX 800-150 MG tablet TAKE 1 TABLET BY MOUTH DAILY. SWALLOW WHOLE. DO NOT CRUSH, BREAK OR CHEW TABLETS. TAKE WITH FOOD. 30 tablet 5   Probiotic Product (PROBIOTIC DAILY PO) Take by mouth daily.     SELZENTRY 150 MG tablet TAKE ONE TABLET BY MOUTH TWICE DAILY 60 tablet 5   Study - REPRIEVE [phone] - pitavastatin 4 mg or placebo tablet ([place]) Take 1 tablet (4 mg total) by mouth daily. 30 tablet    tobramycin-dexamethasone (TOBRADEX) ophthalmic solution Place 2 drops into  the right eye every 4 (four) hours while awake. 5 mL 0   Vitamin E 180 MG CAPS Take by mouth daily.     dexamethasone (DECADRON) 4 MG tablet Take 1-2 tablets (4-8 mg total) by mouth 2 (two) times daily as needed. (Patient not taking: Reported on 01/02/2019) 40 tablet 6   prochlorperazine (COMPAZINE) 10 MG tablet Take 1 tablet (10 mg total) by mouth every 6 (six) hours as needed for nausea or vomiting. (Patient not taking: Reported on 01/02/2019) 30 tablet 4   No current facility-administered medications for this visit.     OBJECTIVE: Morbidly obese African-American woman  Vitals:   01/02/19 1256  BP: 114/75  Pulse: 81  Resp: 18  Temp: 97.8 F (36.6 C)  SpO2: 96%     Body mass index is 38.31 kg/m.    Filed Weights   01/02/19 1256  Weight: 244 lb 9.6 oz (110.9 kg)  ECOG FS: 2  GENERAL: Patient is a well appearing female in no acute distress HEENT:  Sclerae anicteric.  Oropharynx clear and moist. No ulcerations or evidence of oropharyngeal candidiasis. Neck is supple.  NODES:  No cervical, supraclavicular, or axillary lymphadenopathy palpated.  BREAST EXAM:  Deferred. LUNGS:  Diminished in right lung and left lower lung.  No wheezes or rhonchi. HEART:  Regular rate and rhythm. No murmur appreciated. ABDOMEN:  Soft, nontender.  Positive, normoactive bowel sounds. No organomegaly palpated. MSK:  No focal spinal tenderness to palpation. Full range of motion bilaterally in the upper extremities. EXTREMITIES:  No peripheral edema.   SKIN:  Clear with no obvious rashes or skin changes. No nail dyscrasia. NEURO:  Nonfocal. Well oriented.  Appropriate affect.    LAB RESULTS:  CMP     Component Value Date/Time   NA 138 12/19/2018 1158   NA 140 05/13/2017 0927   K 3.8 12/19/2018 1158   K 4.1 05/13/2017 0927   CL 101 12/19/2018 1158   CO2 27 12/19/2018 1158   CO2  29 05/13/2017 0927   GLUCOSE 86 12/19/2018 1158   GLUCOSE 102 05/13/2017 0927   BUN 14 12/19/2018 1158   BUN 17.7  05/13/2017 0927   CREATININE 1.37 (H) 12/19/2018 1158   CREATININE 1.62 (H) 09/18/2018 0911   CREATININE 1.4 (H) 05/13/2017 0927   CALCIUM 10.3 12/19/2018 1158   CALCIUM 10.5 (H) 05/13/2017 0927   PROT 8.3 (H) 12/19/2018 1158   PROT 8.1 05/13/2017 0927   ALBUMIN 3.6 12/19/2018 1158   ALBUMIN 3.5 05/13/2017 0927   AST 30 12/19/2018 1158   AST 14 05/13/2017 0927   ALT 16 12/19/2018 1158   ALT 12 05/13/2017 0927   ALKPHOS 54 12/19/2018 1158   ALKPHOS 90 05/13/2017 0927   BILITOT 0.4 12/19/2018 1158   BILITOT 0.27 05/13/2017 0927   GFRNONAA 41 (L) 12/19/2018 1158   GFRNONAA 34 (L) 09/18/2018 0911   GFRAA 48 (L) 12/19/2018 1158   GFRAA 39 (L) 09/18/2018 0911    No results found for: Ronnald Ramp, A1GS, A2GS, BETS, BETA2SER, GAMS, MSPIKE, SPEI  No results found for: Nils Pyle, [facility]  Lab Results  Component Value Date   WBC 3.0 (L) 01/02/2019   NEUTROABS 0.7 (L) 01/02/2019   HGB 12.9 01/02/2019   HCT 39.1 01/02/2019   MCV 95.8 01/02/2019   PLT 280 01/02/2019      Chemistry      Component Value Date/Time   NA 138 12/19/2018 1158   NA 140 05/13/2017 0927   K 3.8 12/19/2018 1158   K 4.1 05/13/2017 0927   CL 101 12/19/2018 1158   CO2 27 12/19/2018 1158   CO2 29 05/13/2017 0927   BUN 14 12/19/2018 1158   BUN 17.7 05/13/2017 0927   CREATININE 1.37 (H) 12/19/2018 1158   CREATININE 1.62 (H) 09/18/2018 0911   CREATININE 1.4 (H) 05/13/2017 0927   GLU 104 02/13/2016 1358      Component Value Date/Time   CALCIUM 10.3 12/19/2018 1158   CALCIUM 10.5 (H) 05/13/2017 0927   ALKPHOS 54 12/19/2018 1158   ALKPHOS 90 05/13/2017 0927   AST 30 12/19/2018 1158   AST 14 05/13/2017 0927   ALT 16 12/19/2018 1158   ALT 12 05/13/2017 0927   BILITOT 0.4 12/19/2018 1158   BILITOT 0.27 05/13/2017 0927       Lab Results  Component Value Date   LABCA2 11 04/20/2008    No components found for: [id number]  No results for input(s): INR in the last 168  hours.  Urinalysis    Component Value Date/Time   COLORURINE YELLOW 05/30/2018 1123   APPEARANCEUR CLEAR 05/30/2018 1123   LABSPEC 1.017 05/30/2018 1123   PHURINE 6.0 05/30/2018 1123   GLUCOSEU NEGATIVE 05/30/2018 1123   GLUCOSEU NEG mg/dL 07/23/2006 2113   HGBUR NEGATIVE 05/30/2018 1123   BILIRUBINUR NEGATIVE 05/30/2018 1123   KETONESUR NEGATIVE 05/30/2018 [place] 05/30/2018 1123   UROBILINOGEN 1 07/23/2006 2113   NITRITE NEGATIVE 05/30/2018 1123   LEUKOCYTESUR NEGATIVE 05/30/2018 1123     STUDIES: Mm Digital Screening Unilat L  Result Date: 12/22/2018 CLINICAL DATA:  Screening. History of right breast cancer and right mastectomy. EXAM: DIGITAL SCREENING UNILATERAL LEFT MAMMOGRAM WITH CAD COMPARISON:  Previous exam(s). ACR Breast Density Category b: There are scattered areas of fibroglandular density. FINDINGS: In the left breast, a possible mass warrants further evaluation. Patient has had a right mastectomy. Images were processed with CAD. IMPRESSION: Further evaluation is suggested for possible mass in the left breast. RECOMMENDATION: Diagnostic  mammogram and possibly ultrasound of the left breast. (Code:[id number]) The patient will be contacted regarding the findings, and additional imaging will be scheduled. BI-RADS CATEGORY  0: Incomplete. Need additional imaging evaluation and/or prior mammograms for comparison. Electronically Signed   By: Curlene Dolphin M.D.   On: 12/22/2018 15:50   Mm Diag Breast Tomo Uni Left  Result Date: 12/25/2018 CLINICAL DATA:  Screening recall for possible left breast mass. History of metastatic right breast cancer. EXAM: DIGITAL DIAGNOSTIC UNILATERAL LEFT MAMMOGRAM WITH CAD AND TOMO COMPARISON:  Previous exam(s). ACR Breast Density Category b: There are scattered areas of fibroglandular density. FINDINGS: Tomograms were performed of the left breast. No suspicious masses or abnormality seen, with stable mammographic appearance of the left  breast dating back to at least 2012. There is no mammographic evidence of malignancy. Mammographic images were processed with CAD. IMPRESSION: No mammographic evidence of malignancy in the left breast. RECOMMENDATION: Screening mammography of the left breast in 1 year. I have discussed the findings and recommendations with the patient. Results were also provided in writing at the conclusion of the visit. If applicable, a reminder letter will be sent to the patient regarding the next appointment. BI-RADS CATEGORY  1: Negative. Electronically Signed   By: Everlean Alstrom M.D.   On: 12/25/2018 16:07      ELIGIBLE FOR AVAILABLE RESEARCH PROTOCOL: no   ASSESSMENT: 63 y.o. [place] woman   (1) status post right lumpectomy and sentinel lymph node sampling October 2005 for a 0.6 cm invasive ductal carcinoma involving one out of 2 sentinel lymph nodes sampled, grade 3, triple-negative, treated adjuvantly with doxorubicin and cyclophosphamide 4 followed by weekly paclitaxel 7, followed by adjuvant radiation  RECURRENT DISEASE: (2) status post right breast upper outer quadrant biopsy and right axillary lymph node biopsy 10/01/2016, both positive for a T2 N1, stage IIIB invasive ductal carcinoma, triple negative, with an MIB-1 of 50-70%  (3) status post right modified radical mastectomy 11/05/2016 showing a pT2 pN1, stage IIIB invasive ductal carcinoma, grade 3, triple negative, with negative margins  (4) not a candidate for radiation given prior history  (5) adjuvant chemotherapy consisting of carboplatin and gemcitabine given days 1 and 8 of each 21 day cycle, for 6 cycles, starting 11/20/2016, completed 03/11/2017  (a) day 8 cycle 2 omitted because of neutropenia; Neupogen/Neulasta added  (5) HIV positivity: under care of Id Lucianne [place])  (6) Genetic testing 06/17/2017:  no pathogenic mutations. Genes tested: APC, ATM, AXIN2, BARD1, BLM, BMPR1A, BRCA1, BRCA2, BRIP1, CDH1, CDK4, CDKN2A (p14ARF),  CDKN2A (p16INK4a), CEBPA, CHEK2, CTNNA1, DICER1, EPCAM*, GATA2, GREM1*, HRAS, KIT, MEN1, MLH1, MSH2, MSH3, MSH6, MUTYH, NBN, NF1, PALB2, PDGFRA, PMS2, POLD1, POLE, PTEN, RAD50, RAD51C, RAD51D, RUNX1, SDHB, SDHC, SDHD, SMAD4, SMARCA4, STK11, TERC, TERT, TP53, TSC1, TSC2, VHL. The following genes were evaluated for sequence changes only: HOXB13*, NTHL1*, SDHA.   (a) A variant of uncertain significance (VUS) in a gene called NTHL1 was also noted. c.736G>A (p.Ala246Thr)  METASTATIC DISEASE: November 2019 (1) Patient seen in urgent care and ultimately ED on 10/21 for shortness of breath, chest xray demonstrated Right pleural effusion.   (a) right thoracenteses on 10/21 and 11/4 results show atypical cells, non diagnostic (b) CT chest 04/22/2018 shows re-accumulation of fluid, and right pleural nodularity. (c) PET scan on 05/01/2018 shows hypermetabolic pleural based metastases, right CP angle nodal metastases, no evidence of malignancy in abdomen and pelvis. (d) bronchoscopy with biopsy by Dr. Lamonte Sakai and BAL on 05/15/2018 was non diagnostic  (e) VAT  biopsy of the right pleura 05/30/2018 confirms carcinoma, again triple negative  (f) [facility] shows PD-L1 positive (1% in [facility]); otherwise microsatellite stable, TMB low (5/Mb), no PIK3 mutations; other mutations suggest sensitivity to MTOR inhibitors and several TKIs  (2) Atezolizumab started 07/15/2018 given every other week  (a) PET 08/13/2018 shows tumor Right hemithorax (new baseline study)  (b) Atezo changed to Q4w starting with 11/07/2018 dose  (c) PET 11/28/2018 documents progression in the right lung and pleural area as well as lymph nodes, and a T1 skeletal metastasis  (d) atezolizumab discontinued after 12/05/2018 dose  (3) eribulin day 1 and 8 of every 21 day cycle started 12/19/2018  (a) changed to every other week with Neulsta support due to neutropenia causing treatment delays  PLAN:  Natalea is feeling much better today.   Unfortunately she is neutropenic.  It has been 2 weeks since her treatment.  She notes she has had issues with neutropenia in the past.  She cannot receive Eribulin today.  I will give her two doses of granix, 479mg.    MAileenwill return next week for labs, f/u, and her next treatment.  I have changed her treatments to every other week with Neulasta support due to the fact that she has had neutropenia, and has h/o HIV, she is at high risk for complications should she be neutropenic.  Hopefully her insurance company will approve this.  A total of (20) minutes of face-to-face time was spent with this patient with greater than 50% of that time in counseling and care-coordination.   LWilber Bihari NP 01/02/19 1:27 PM Medical Oncology and Hematology [facility] StreetABruceton [place] [address]Tel. [phone]   Fax. [phone]

## 2019-01-02 NOTE — Patient Instructions (Signed)
Tbo-Filgrastim injection What is this medicine? TBO-FILGRASTIM (T B O fil GRA stim) is a granulocyte colony-stimulating factor that helps you make more neutrophils, a type of white blood cell. Neutrophils are important for fighting infections. Some chemotherapy affects your bone marrow and lowers your neutrophils. This medicine helps decrease the length of time that neutrophils are very low (severe neutropenia). This medicine may be used for other purposes; ask your health care provider or pharmacist if you have questions. COMMON BRAND NAME(S): Granix What should I tell my health care provider before I take this medicine? They need to know if you have any of these conditions:  bone scan or tests planned  kidney disease  sickle cell anemia  an unusual or allergic reaction to tbo-filgrastim, filgrastim, pegfilgrastim, other medicines, foods, dyes, or preservatives  pregnant or trying to get pregnant  breast-feeding How should I use this medicine? This medicine is for injection under the skin. If you get this medicine at home, you will be taught how to prepare and give this medicine. Refer to the Instructions for Use that come with your medication packaging. Use exactly as directed. Take your medicine at regular intervals. Do not take your medicine more often than directed. It is important that you put your used needles and syringes in a special sharps container. Do not put them in a trash can. If you do not have a sharps container, call your pharmacist or healthcare provider to get one. Talk to your pediatrician regarding the use of this medicine in children. While this drug may be prescribed for children as young as 34 month of age for selected conditions, precautions do apply. Overdosage: If you think you have taken too much of this medicine contact a poison control center or emergency room at once. NOTE: This medicine is only for you. Do not share this medicine with others. What if I miss a  dose? It is important not to miss your dose. Call your doctor or health care professional if you miss a dose. What may interact with this medicine? This medicine may interact with the following medications:  medicines that may cause a release of neutrophils, such as lithium This list may not describe all possible interactions. Give your health care provider a list of all the medicines, herbs, non-prescription drugs, or dietary supplements you use. Also tell them if you smoke, drink alcohol, or use illegal drugs. Some items may interact with your medicine. What should I watch for while using this medicine? You may need blood work done while you are taking this medicine. What side effects may I notice from receiving this medicine? Side effects that you should report to your doctor or health care professional as soon as possible:  allergic reactions like skin rash, itching or hives, swelling of the face, lips, or tongue  back pain  blood in the urine  dark urine  dizziness  fast heartbeat  feeling faint  shortness of breath or breathing problems  signs and symptoms of infection like fever or chills; cough; or sore throat  signs and symptoms of kidney injury like trouble passing urine or change in the amount of urine  stomach or side pain, or pain at the shoulder  sweating  swelling of the legs, ankles, or abdomen  tiredness Side effects that usually do not require medical attention (report to your doctor or health care professional if they continue or are bothersome):  bone pain  diarrhea  headache  muscle pain  vomiting This list may  not describe all possible side effects. Call your doctor for medical advice about side effects. You may report side effects to FDA at 1-800-FDA-1088. Where should I keep my medicine? Keep out of the reach of children. Store in a refrigerator between 2 and 8 degrees C (36 and 46 degrees F). Keep in carton to protect from light. Throw  away this medicine if it is left out of the refrigerator for more than 5 consecutive days. Throw away any unused medicine after the expiration date. NOTE: This sheet is a summary. It may not cover all possible information. If you have questions about this medicine, talk to your doctor, pharmacist, or health care provider.  2020 Elsevier/Gold Standard (2018-04-12 19:58:39)

## 2019-01-03 LAB — CANCER ANTIGEN 27.29: CA 27.29: 46.6 U/mL — ABNORMAL HIGH (ref 0.0–38.6)

## 2019-01-05 ENCOUNTER — Other Ambulatory Visit: Payer: Self-pay

## 2019-01-05 ENCOUNTER — Inpatient Hospital Stay: Payer: Medicare HMO

## 2019-01-05 VITALS — BP 118/72 | HR 81 | Temp 98.2°F | Resp 18

## 2019-01-05 DIAGNOSIS — C50411 Malignant neoplasm of upper-outer quadrant of right female breast: Secondary | ICD-10-CM | POA: Diagnosis not present

## 2019-01-05 DIAGNOSIS — C782 Secondary malignant neoplasm of pleura: Secondary | ICD-10-CM | POA: Diagnosis not present

## 2019-01-05 DIAGNOSIS — C7801 Secondary malignant neoplasm of right lung: Secondary | ICD-10-CM | POA: Diagnosis not present

## 2019-01-05 DIAGNOSIS — Z171 Estrogen receptor negative status [ER-]: Secondary | ICD-10-CM

## 2019-01-05 DIAGNOSIS — Z5111 Encounter for antineoplastic chemotherapy: Secondary | ICD-10-CM | POA: Diagnosis not present

## 2019-01-05 DIAGNOSIS — Z9011 Acquired absence of right breast and nipple: Secondary | ICD-10-CM | POA: Diagnosis not present

## 2019-01-05 DIAGNOSIS — Z95828 Presence of other vascular implants and grafts: Secondary | ICD-10-CM

## 2019-01-05 DIAGNOSIS — D701 Agranulocytosis secondary to cancer chemotherapy: Secondary | ICD-10-CM | POA: Diagnosis not present

## 2019-01-05 DIAGNOSIS — Z21 Asymptomatic human immunodeficiency virus [HIV] infection status: Secondary | ICD-10-CM | POA: Diagnosis not present

## 2019-01-05 DIAGNOSIS — C7951 Secondary malignant neoplasm of bone: Secondary | ICD-10-CM | POA: Diagnosis not present

## 2019-01-05 MED ORDER — TBO-FILGRASTIM 480 MCG/0.8ML ~~LOC~~ SOSY
PREFILLED_SYRINGE | SUBCUTANEOUS | Status: AC
  Filled 2019-01-05: qty 0.8

## 2019-01-05 MED ORDER — TBO-FILGRASTIM 480 MCG/0.8ML ~~LOC~~ SOSY
480.0000 ug | PREFILLED_SYRINGE | Freq: Once | SUBCUTANEOUS | Status: AC
  Administered 2019-01-05: 480 ug via SUBCUTANEOUS

## 2019-01-05 MED FILL — OLMESARTAN-HCTZ 40-25 MG TA: 40-25 | 30 days supply | Qty: 30 | Fill #0

## 2019-01-05 MED FILL — SELZENTRY 150 MG TABS: 150 | 30 days supply | Qty: 60 | Fill #0

## 2019-01-05 NOTE — Patient Instructions (Signed)
Tbo-Filgrastim injection What is this medicine? TBO-FILGRASTIM (T B O fil GRA stim) is a granulocyte colony-stimulating factor that helps you make more neutrophils, a type of white blood cell. Neutrophils are important for fighting infections. Some chemotherapy affects your bone marrow and lowers your neutrophils. This medicine helps decrease the length of time that neutrophils are very low (severe neutropenia). This medicine may be used for other purposes; ask your health care provider or pharmacist if you have questions. COMMON BRAND NAME(S): Granix What should I tell my health care provider before I take this medicine? They need to know if you have any of these conditions:  bone scan or tests planned  kidney disease  sickle cell anemia  an unusual or allergic reaction to tbo-filgrastim, filgrastim, pegfilgrastim, other medicines, foods, dyes, or preservatives  pregnant or trying to get pregnant  breast-feeding How should I use this medicine? This medicine is for injection under the skin. If you get this medicine at home, you will be taught how to prepare and give this medicine. Refer to the Instructions for Use that come with your medication packaging. Use exactly as directed. Take your medicine at regular intervals. Do not take your medicine more often than directed. It is important that you put your used needles and syringes in a special sharps container. Do not put them in a trash can. If you do not have a sharps container, call your pharmacist or healthcare provider to get one. Talk to your pediatrician regarding the use of this medicine in children. While this drug may be prescribed for children as young as 51 month of age for selected conditions, precautions do apply. Overdosage: If you think you have taken too much of this medicine contact a poison control center or emergency room at once. NOTE: This medicine is only for you. Do not share this medicine with others. What if I miss a  dose? It is important not to miss your dose. Call your doctor or health care professional if you miss a dose. What may interact with this medicine? This medicine may interact with the following medications:  medicines that may cause a release of neutrophils, such as lithium This list may not describe all possible interactions. Give your health care provider a list of all the medicines, herbs, non-prescription drugs, or dietary supplements you use. Also tell them if you smoke, drink alcohol, or use illegal drugs. Some items may interact with your medicine. What should I watch for while using this medicine? You may need blood work done while you are taking this medicine. What side effects may I notice from receiving this medicine? Side effects that you should report to your doctor or health care professional as soon as possible:  allergic reactions like skin rash, itching or hives, swelling of the face, lips, or tongue  back pain  blood in the urine  dark urine  dizziness  fast heartbeat  feeling faint  shortness of breath or breathing problems  signs and symptoms of infection like fever or chills; cough; or sore throat  signs and symptoms of kidney injury like trouble passing urine or change in the amount of urine  stomach or side pain, or pain at the shoulder  sweating  swelling of the legs, ankles, or abdomen  tiredness Side effects that usually do not require medical attention (report to your doctor or health care professional if they continue or are bothersome):  bone pain  diarrhea  headache  muscle pain  vomiting This list may  not describe all possible side effects. Call your doctor for medical advice about side effects. You may report side effects to FDA at 1-800-FDA-1088. Where should I keep my medicine? Keep out of the reach of children. Store in a refrigerator between 2 and 8 degrees C (36 and 46 degrees F). Keep in carton to protect from light. Throw  away this medicine if it is left out of the refrigerator for more than 5 consecutive days. Throw away any unused medicine after the expiration date. NOTE: This sheet is a summary. It may not cover all possible information. If you have questions about this medicine, talk to your doctor, pharmacist, or health care provider.  2020 Elsevier/Gold Standard (2018-04-12 19:58:39)

## 2019-01-06 ENCOUNTER — Other Ambulatory Visit: Payer: Self-pay

## 2019-01-08 ENCOUNTER — Ambulatory Visit (INDEPENDENT_AMBULATORY_CARE_PROVIDER_SITE_OTHER): Payer: Medicare HMO | Admitting: Endocrinology

## 2019-01-08 ENCOUNTER — Encounter: Payer: Self-pay | Admitting: Endocrinology

## 2019-01-08 ENCOUNTER — Other Ambulatory Visit: Payer: Self-pay

## 2019-01-08 VITALS — BP 130/88 | HR 95 | Ht 67.0 in | Wt 240.8 lb

## 2019-01-08 DIAGNOSIS — E890 Postprocedural hypothyroidism: Secondary | ICD-10-CM | POA: Diagnosis not present

## 2019-01-08 MED ORDER — LEVOTHYROXINE SODIUM 200 MCG PO TABS: 200.0000 ug | ORAL_TABLET | Freq: Every day | ORAL | 1 refills | Status: DC

## 2019-01-08 MED FILL — LEVOTHYROXINE 200 MCG TAB: 200 | 90 days supply | Qty: 90 | Fill #0

## 2019-01-08 NOTE — Progress Notes (Signed)
Subjective:    Patient ID: Kayla Price, female    DOB: [birth year], 63 y.o.   MRN: [id number]  HPI Pt returns for f/u of post-RAI hypothyroidism (she had RAI for hyperthyroidism due to [place] in 2009, and now takes synthroid).   She says she never misses synthroid.  she takes on an empty stomach.  pt states she feels well in general.  She has had 1st chemo tx.   Past Medical History:  Diagnosis Date  . Alopecia areata 11/28/2009  . Bell's palsy   . Cancer [facility]) 2005   Breast cancer   chemotherapy and radiation  . CKD (chronic kidney disease) stage 3, GFR 30-59 ml/min (HCC) 06/08/2015  . Dry eye syndrome   . Family history of lung cancer   . Family history of non-Hodgkin's lymphoma   . Fasting hyperglycemia   . Gestational diabetes    2001  . HIP FRACTURE, RIGHT 05/06/2008  . History of kidney stones   . HIV DISEASE 03/27/2006  . HYPERLIPIDEMIA, MIXED 12/15/2007  . HYPERTENSION 03/27/2006  . 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 MENORRHAGIA, POSTMENOPAUSAL 02/03/2009  . Osteoarthritis of left knee 06/08/2015  . OSTEOARTHROSIS, LOCAL, SCND, UNSPC SITE 04/07/2007  . Personal history of chemotherapy 11/2018  . PVD 04/07/2007  . Tinea capitis   . TRIGGER FINGER 05/06/2008  . Unspecified vitamin D deficiency 08/06/2007    Past Surgical History:  Procedure Laterality Date  . BREAST LUMPECTOMY Right    2005  . CHEST TUBE INSERTION Right 05/30/2018   Procedure: INSERTION PLEURAL DRAINAGE CATHETER;  Surgeon: Grace Isaac, MD;  Location: [place];  Service: Thoracic;  Laterality: Right;  . COLONOSCOPY    . COLONOSCOPY WITH ESOPHAGOGASTRODUODENOSCOPY (EGD)    . ENDOBRONCHIAL ULTRASOUND Bilateral 05/15/2018   Procedure: ENDOBRONCHIAL ULTRASOUND;  Surgeon: Collene Gobble, MD;  Location: [facility];  Service: Cardiopulmonary;  Laterality: Bilateral;  . FINE NEEDLE ASPIRATION BIOPSY  05/15/2018   Procedure: FINE NEEDLE ASPIRATION BIOPSY;  Surgeon: Collene Gobble, MD;   Location: [facility];  Service: Cardiopulmonary;;  . FLEXIBLE BRONCHOSCOPY  05/15/2018   Procedure: FLEXIBLE BRONCHOSCOPY;  Surgeon: Collene Gobble, MD;  Location: [facility];  Service: Cardiopulmonary;;  . HYSTEROSCOPY  2006  . IR IMAGING GUIDED PORT INSERTION  07/14/2018  . IR THORACENTESIS ASP PLEURAL SPACE W/IMG GUIDE  04/14/2018  . IR THORACENTESIS ASP PLEURAL SPACE W/IMG GUIDE  04/28/2018  . KNEE ARTHROSCOPY Left 2002  . LYMPH NODE DISSECTION  2005  . MASTECTOMY Right   . MASTECTOMY MODIFIED RADICAL Right 11/05/2016   Procedure: RIGHT MASTECTOMY MODIFIED RADICAL;  Surgeon: Fanny Skates, MD;  Location: [place];  Service: General;  Laterality: Right;  . MODIFIED RADICAL MASTECTOMY Right 11/05/2016  . placement   of port-a-cath    . PLEURAL BIOPSY Right 05/30/2018   Procedure: PLEURAL BIOPSY;  Surgeon: Grace Isaac, MD;  Location: [place];  Service: Thoracic;  Laterality: Right;  . PLEURAL EFFUSION DRAINAGE Right 05/30/2018   Procedure: DRAINAGE OF PLEURAL EFFUSION;  Surgeon: Grace Isaac, MD;  Location: [place];  Service: Thoracic;  Laterality: Right;  . PORT-A-CATH REMOVAL  2006   insertion 2005  . PORT-A-CATH REMOVAL N/A 03/29/2017   Procedure: REMOVAL PORT-A-CATH;  Surgeon: Fanny Skates, MD;  Location: [place];  Service: General;  Laterality: N/A;  . PORTACATH PLACEMENT Right 11/05/2016   Procedure: INSERTION PORT-A-CATH WITH ULTRA SOUND;  Surgeon: Fanny Skates, MD;  Location: [place];  Service: General;  Laterality: Right;  .  removal of port a cath  12/2014  . TALC PLEURODESIS Right 05/30/2018   Procedure: Pietro Cassis;  Surgeon: Grace Isaac, MD;  Location: [place];  Service: Thoracic;  Laterality: Right;  . THYROID SURGERY  2009   Ablation   . TOTAL KNEE ARTHROPLASTY Left 02/06/2016   Procedure: TOTAL KNEE ARTHROPLASTY;  Surgeon: Frederik Pear, MD;  Location: [place];  Service: Orthopedics;  Laterality: Left;  . TUBAL LIGATION    . VIDEO ASSISTED THORACOSCOPY  Right 05/30/2018   Procedure: VIDEO ASSISTED THORACOSCOPY;  Surgeon: Grace Isaac, MD;  Location: [facility] OR;  Service: Thoracic;  Laterality: Right;    Social History   Socioeconomic History  . Marital status: Widowed    Spouse name: Not on file  . Number of children: Not on file  . Years of education: Not on file  . Highest education level: Not on file  Occupational History  . Occupation: Paediatric nurse: UNEMPLOYED  Social Needs  . Financial resource strain: Not hard at all  . Food insecurity    Worry: Never true    Inability: Never true  . Transportation needs    Medical: No    Non-medical: No  Tobacco Use  . Smoking status: Never Smoker  . Smokeless tobacco: Never Used  Substance and Sexual Activity  . Alcohol use: No  . Drug use: No  . Sexual activity: Not Currently    Birth control/protection: Post-menopausal    Comment: declined condoms  Lifestyle  . Physical activity    Days per week: 3 days    Minutes per session: 120 min  . Stress: Not at all  Relationships  . Social connections    Talks on phone: More than three times a week    Gets together: More than three times a week    Attends religious service: More than 4 times per year    Active member of club or organization: No    Attends meetings of clubs or organizations: Never    Relationship status: Widowed  . Intimate partner violence    Fear of current or ex partner: No    Emotionally abused: No    Physically abused: No    Forced sexual activity: No  Other Topics Concern  . Not on file  Social History Narrative   Single/widow   Diet: good   Do you drink/eat things with caffeine? Tea occasionally   Marital status: Widowed  What year were you married? 1993   Do you live in a house, apartment,assisted living, condo,trailer,ect.)? House   Is it one or more stories? Two   How many persons live in your home? 4   Do you have any pets in you home? No   Current or past profession: Tour manager    Do you exercise? Yes  Type&how often: Stationary Bike, Water Exercise 3-4 x week   Do you have a living will? Yes   Do you have a DNR form? No  If not do you what one?   Do you have signed POA/HPOA forms? No   If so, please bring to your appointment.                Current Outpatient Medications on File Prior to Visit  Medication Sig Dispense Refill  . CALCIUM CITRATE-VITAMIN D3 PO Take by mouth. 630 mg of calcium and 13 mcg d3, take once daily    . Cholecalciferol (D3 ADULT PO) Take 25 mcg by mouth daily.    Marland Kitchen  Coenzyme Q10 (COQ10) 100 MG CAPS Take by mouth daily.    Marland Kitchen dexamethasone (DECADRON) 4 MG tablet Take 1-2 tablets (4-8 mg total) by mouth 2 (two) times daily as needed. 40 tablet 6  . ELDERBERRY PO Take 50 mg by mouth daily.    Marland Kitchen HYDROcodone-acetaminophen (NORCO/VICODIN) 5-325 MG tablet Take one tablet 4 times a day as instructed 120 tablet 0  . hydrocortisone cream 1 % Apply 1 application topically daily as needed for itching.    . JULUCA 50-25 MG TABS TAKE 1 TABLET BY MOUTH DAILY WITH BREAKFAST. TAKE WITH THE PREZCOBIX. 30 tablet 5  . lidocaine-prilocaine (EMLA) cream Apply 1 application topically as needed. Apply to port site 1 hour prior to access 30 g 0  . Loratadine 10 MG CAPS Take by mouth as needed.    . Menthol, Topical Analgesic, (BENGAY EX) Apply 1 application topically daily as needed (muscle pain).    . methocarbamol (ROBAXIN) 500 MG tablet Take 1 tablet (500 mg total) by mouth every 8 (eight) hours as needed for muscle spasms. 90 tablet 1  . Methylsulfonylmethane (MSM) 1000 MG TABS Take by mouth daily.    . Naphazoline-Glycerin (REDNESS RELIEF OP) Place 1 drop into both eyes daily as needed (redness).    Marland Kitchen olmesartan-hydrochlorothiazide (BENICAR HCT) 40-25 MG tablet TAKE 1 TABLET BY MOUTH DAILY. 30 tablet 1  . Omega-3 Fatty Acids (FISH OIL) 1000 MG CAPS Take by mouth daily.    . potassium gluconate 595 (99 K) MG TABS tablet Take 595 mg by mouth daily.    Marland Kitchen  PREZCOBIX 800-150 MG tablet TAKE 1 TABLET BY MOUTH DAILY. SWALLOW WHOLE. DO NOT CRUSH, BREAK OR CHEW TABLETS. TAKE WITH FOOD. 30 tablet 5  . Probiotic Product (PROBIOTIC DAILY PO) Take by mouth daily.    . prochlorperazine (COMPAZINE) 10 MG tablet Take 1 tablet (10 mg total) by mouth every 6 (six) hours as needed for nausea or vomiting. 30 tablet 4  . SELZENTRY 150 MG tablet TAKE ONE TABLET BY MOUTH TWICE DAILY 60 tablet 5  . Study - REPRIEVE [phone] - pitavastatin 4 mg or placebo tablet ([place]) Take 1 tablet (4 mg total) by mouth daily. 30 tablet   . tobramycin-dexamethasone (TOBRADEX) ophthalmic solution Place 2 drops into the right eye every 4 (four) hours while awake. 5 mL 0  . Vitamin E 180 MG CAPS Take by mouth daily.     No current facility-administered medications on file prior to visit.     Allergies  Allergen Reactions  . Lisinopril Anaphylaxis and Swelling    Swelling of tongue and mouth 11/05/16- tolerates Olmesartan  . Pepcid [Famotidine] Other (See Comments)    PPI H2, BLOCKERS LOWER GASTRIC PH WHICH WOULD LEAD TO SUBTHERAPEUTIC RILPIVIRINE LEVELS AND POTENTIAL VIROLOGICAL FAILURE WITH RESISTANCE  . Prilosec [Omeprazole] Other (See Comments)    PPI H2, BLOCKERS LOWER GASTRIC PH WHICH WOULD LEAD TO SUBTHERAPEUTIC RILPIVIRINE LEVELS AND POTENTIAL VIROLOGICAL FAILURE WITH RESISTANCE  . Tums [Calcium Carbonate Antacid] Other (See Comments)    TUMS ANTACIDS CAN LOWER GASTRIC PH WHICH COULD  LEAD TO SUBTHERAPEUTIC RILPIVIRINE LEVELS AND POTENTIAL VIROLOGICAL FAILURE WITH RESISTANCE TUMS CAN BE GIVEN BUT NEED CONSULT WITH ID PHARMACY RE TIMING. I PREFER HER TO AVOID ALL TOGETHER    Family History  Problem Relation Age of Onset  . Heart attack Brother        Massive MI in 59s  . Stroke Brother        CAD  .  Kidney disease Mother   . Stroke Mother   . Diabetes Mother   . Liver disease Sister   . COPD Sister        had I-131 rx of hyperthyroidism  . Diabetes Sister   .  Stroke Sister   . Lung cancer Paternal Uncle        hx smoking  . Cancer Cousin   . Non-Hodgkin's lymphoma Cousin 25       cancer x3, in prostate and lung- unsure if met/spread or if primaries  . Heart failure Father   . Heart disease Father   . Arthritis Father   . Sarcoidosis Sister     BP 130/88 (BP Location: Left Arm, Patient Position: Sitting, Cuff Size: Large)   Pulse 95   Ht _0  (1.702 m)   Wt 240 lb 12.8 oz (109.2 kg)   LMP 11/06/2003   SpO2 92%   BMI 37.71 kg/m    Review of Systems She has lost a few lbs.      Objective:   Physical Exam VITAL SIGNS:  See vs page GENERAL: no distress NECK: There is no palpable thyroid enlargement.  No thyroid nodule is palpable.  No palpable lymphadenopathy at the anterior neck.    Lab Results  Component Value Date   TSH 7.373 (H) 01/09/2019   T3TOTAL 271.2 (H) 12/15/2007   T4TOTAL 12.0 12/05/2018       Assessment & Plan:  Hypothyroidism: she needs increased rx   Patient Instructions  I have sent a prescription to your pharmacy, to increase the levothyroxine. It is best to never miss the medication.  However, if you do miss it, next best is to double up the next time.   Please come back for a follow-up appointment in 3 months.

## 2019-01-08 NOTE — Patient Instructions (Addendum)
I have sent a prescription to your pharmacy, to increase the levothyroxine. It is best to never miss the medication.  However, if you do miss it, next best is to double up the next time.   Please come back for a follow-up appointment in 3 months.

## 2019-01-08 NOTE — Progress Notes (Signed)
[place]  [phone] [phone]) [phone]    ID: Kayla Price DOB: July 27, 1955  MR#: [id number]  XTG#:[id number]  Patient Care Team: Lauree Chandler, NP as PCP - General (Nurse Practitioner) Tommy Medal, Lavell Islam, MD as PCP - Infectious Diseases (Infectious Diseases) Clent Jacks, MD as Consulting Physician (Ophthalmology) Renato Shin, MD as Consulting Physician (Endocrinology) Frederik Pear, MD as Consulting Physician (Orthopedic Surgery) Magrinat, Virgie Dad, MD as Consulting Physician (Oncology) Fanny Skates, MD as Consulting Physician (General Surgery) Donnamae Jude, MD as Consulting Physician (Obstetrics and Gynecology) Delice Bison, Charlestine Massed, NP as Nurse Practitioner (Hematology and Oncology) Webb Laws, [place] as Referring Physician (Optometry) OTHER MD:   CHIEF COMPLAINT: Triple negative breast cancer, recurrent  CURRENT TREATMENT:  eribulin  INTERVAL HISTORY: Kayla Price returns today for follow-up and treatment of her recurrent triple negative breast cancer.  She had been on atezolizumab. She tolerated that well but there was evidence of disease progression. She is receiving Eribulin.  Today is cycle 2 day 1. Her treatemtn was held last week due to neutropenia from only one dose given 3 weeks ago.  She was given 2 doses of granix.  She will now receive Eribulin every 2 weeks with Onpro.    REVIEW OF SYSTEMS: Kayla Price is doing moderately well today.  She has some constipation.  This is managed with senokot.  She is increasing her fiber intake and is also eating more fruits and vegetables.  She has slowly lost about 40 pounds due to diet changes.  She is continuing to work on slowly losing weight.    Kayla Price is feeling well overall.  She denies any new pain, or increased shortness of breath.  She does not like wearing a mask due to it making it more difficult to breathe.  She denies any fever or chills. She is without headache or vision  changes.  She has no new skin lesions or rashes, nausea or vomiting.  A detailed ROS was otherwise non contributory.     BREAST CANCER HISTORY: From the original intake note:  Kayla Price is a history of right-sided breast cancer dating back to 2005. She had a lumpectomy with sentinel lymph node sampling, chemotherapy, and radiation. I do not have access to those records at present.  More recently she had bilateral screening mammography at the [facility] 09/25/2016 showing a possible mass in the right breast. Diagnostic mammography with ultrasonography on 09/28/2016 the patient underwent right diagnostic mammography with tomography and right breast ultrasonography. The breast density was category A. In the right breast at the 10:00 position there was an irregular mass measuring 2.5 cm. Ultrasound identified this the 10:00 radiant 10 cm from the nipple measuring 2.4 cm. In the right axilla there was an abnormal lymph node measuring 1.3 cm with other normal-appearing lymph nodes.  On 10/01/2016 she  underwent biopsy of the right breast mass in question as well as the suspicious axillary lymph node. 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 The proliferation marker was 70% in the breast lesion and 50% in the lymph node.  Her subsequent history is as detailed below.    PAST MEDICAL HISTORY: Past Medical History:  Diagnosis Date   Alopecia areata 11/28/2009   Bell's palsy    Cancer ([place]) 2005   Breast cancer   chemotherapy and radiation   CKD (chronic kidney disease) stage 3, GFR 30-59  ml/min ([place]) 06/08/2015   Dry eye syndrome    Family history of lung cancer    Family history of non-Hodgkin's lymphoma    Fasting hyperglycemia    Gestational diabetes    2001   HIP FRACTURE, RIGHT 05/06/2008   History of kidney stones    HIV DISEASE  03/27/2006   HYPERLIPIDEMIA, MIXED 12/15/2007   HYPERTENSION 03/27/2006   HYPOTHYROIDISM, POST-RADIATION 06/28/2008   MENORRHAGIA, POSTMENOPAUSAL 02/03/2009   Osteoarthritis of left knee 06/08/2015   OSTEOARTHROSIS, LOCAL, SCND, UNSPC SITE 04/07/2007   Personal history of chemotherapy 11/2018   PVD 04/07/2007   Tinea capitis    TRIGGER FINGER 05/06/2008   Unspecified vitamin D deficiency 08/06/2007    PAST SURGICAL HISTORY: Past Surgical History:  Procedure Laterality Date   BREAST LUMPECTOMY Right    2005   CHEST TUBE INSERTION Right 05/30/2018   Procedure: INSERTION PLEURAL DRAINAGE CATHETER;  Surgeon: Grace Isaac, MD;  Location: [facility];  Service: Thoracic;  Laterality: Right;   COLONOSCOPY     COLONOSCOPY WITH ESOPHAGOGASTRODUODENOSCOPY (EGD)     ENDOBRONCHIAL ULTRASOUND Bilateral 05/15/2018   Procedure: ENDOBRONCHIAL ULTRASOUND;  Surgeon: Collene Gobble, MD;  Location: [facility] ENDOSCOPY;  Service: Cardiopulmonary;  Laterality: Bilateral;   FINE NEEDLE ASPIRATION BIOPSY  05/15/2018   Procedure: FINE NEEDLE ASPIRATION BIOPSY;  Surgeon: Collene Gobble, MD;  Location: [facility];  Service: Cardiopulmonary;;   FLEXIBLE BRONCHOSCOPY  05/15/2018   Procedure: FLEXIBLE BRONCHOSCOPY;  Surgeon: Collene Gobble, MD;  Location: [facility] END[facility];  Service: Cardiopulmonary;;   HYSTEROSCOPY  2006   IR IMAGING GUIDED PORT INSERTION  07/14/2018   IR THORACENTESIS ASP PLEURAL SPACE W/IMG GUIDE  04/14/2018   IR THORACENTESIS ASP PLEURAL SPACE W/IMG GUIDE  04/28/2018   KNEE ARTHROSCOPY Left 2002   LYMPH NODE DISSECTION  2005   MASTECTOMY Right    MASTECTOMY MODIFIED RADICAL Right 11/05/2016   Procedure: RIGHT MASTECTOMY MODIFIED RADICAL;  Surgeon: Fanny Skates, MD;  Location: [facility];  Service: General;  Laterality: Right;   MODIFIED RADICAL MASTECTOMY Right 11/05/2016   placement   of port-a-cath     PLEURAL BIOPSY Right 05/30/2018   Procedure: PLEURAL BIOPSY;  Surgeon:  Grace Isaac, MD;  Location: [facility];  Service: Thoracic;  Laterality: Right;   PLEURAL EFFUSION DRAINAGE Right 05/30/2018   Procedure: DRAINAGE OF PLEURAL EFFUSION;  Surgeon: Grace Isaac, MD;  Location: [facility];  Service: Thoracic;  Laterality: Right;   PORT-A-CATH REMOVAL  2006   insertion 2005   PORT-A-CATH REMOVAL N/A 03/29/2017   Procedure: REMOVAL PORT-A-CATH;  Surgeon: Fanny Skates, MD;  Location: [place];  Service: General;  Laterality: N/A;   PORTACATH PLACEMENT Right 11/05/2016   Procedure: INSERTION PORT-A-CATH WITH ULTRA SOUND;  Surgeon: Fanny Skates, MD;  Location: [facility];  Service: General;  Laterality: Right;   removal of port a cath  12/2014   TALC PLEURODESIS Right 05/30/2018   Procedure: Pietro Cassis;  Surgeon: Grace Isaac, MD;  Location: [place];  Service: Thoracic;  Laterality: Right;   THYROID SURGERY  2009   Ablation    TOTAL KNEE ARTHROPLASTY Left 02/06/2016   Procedure: TOTAL KNEE ARTHROPLASTY;  Surgeon: Frederik Pear, MD;  Location: [place];  Service: Orthopedics;  Laterality: Left;   TUBAL LIGATION     VIDEO ASSISTED THORACOSCOPY Right 05/30/2018   Procedure: VIDEO ASSISTED THORACOSCOPY;  Surgeon: Grace Isaac, MD;  Location: [place];  Service: Thoracic;  Laterality: Right;    FAMILY HISTORY Family  History  Problem Relation Age of Onset   Heart attack Brother        Massive MI in 67s   Stroke Brother        CAD   Kidney disease Mother    Stroke Mother    Diabetes Mother    Liver disease Sister    COPD Sister        had I-131 rx of hyperthyroidism   Diabetes Sister    Stroke Sister    Lung cancer Paternal Uncle        hx smoking   Cancer Cousin    Non-Hodgkin's lymphoma Cousin 25       cancer x3, in prostate and lung- unsure if met/spread or if primaries   Heart failure Father    Heart disease Father    Arthritis Father    Sarcoidosis Sister    The patient's father died at the age of 11 in the setting of  Alzheimer's disease. The patient's mother died at the age of 29 from complications of diabetes. The patient has 3 brothers, 4 sisters. There is no history of breast or ovarian cancer in the family area    GYNECOLOGIC HISTORY:  Patient's last menstrual period was 11/06/2003.  menarche age 44, first live birth age 71, the patient is Kayla Price P2. She stopped having periods in 2005, with her chemotherapy; she never took hormone replacement    SOCIAL HISTORY:  Talonda worked for the [employer] more than 20 years. 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 At home she lives with her daughter Orland Dec who works for [employer] and her granddaughter Donita Brooks, 107 y/o as of APRIL 2018.  The patient's son also drops infrequently.  They are all careful regarding pandemic precautions    ADVANCED DIRECTIVES:  not in place    HEALTH MAINTENANCE: Social History   Tobacco Use   Smoking status: Never Smoker   Smokeless tobacco: Never Used  Substance Use Topics   Alcohol use: No   Drug use: No     Colonoscopy:  PAP:  Bone density:   Allergies  Allergen Reactions   Lisinopril Anaphylaxis and Swelling    Swelling of tongue and mouth 11/05/16- tolerates Olmesartan   Pepcid [Famotidine] Other (See Comments)    PPI H2, BLOCKERS LOWER GASTRIC PH WHICH WOULD LEAD TO SUBTHERAPEUTIC RILPIVIRINE LEVELS AND POTENTIAL VIROLOGICAL FAILURE WITH RESISTANCE   Prilosec [Omeprazole] Other (See Comments)    PPI H2, BLOCKERS LOWER GASTRIC PH WHICH WOULD LEAD TO SUBTHERAPEUTIC RILPIVIRINE LEVELS AND POTENTIAL VIROLOGICAL FAILURE WITH RESISTANCE   Tums [Calcium Carbonate Antacid] Other (See Comments)    TUMS ANTACIDS CAN LOWER GASTRIC PH WHICH COULD  LEAD TO SUBTHERAPEUTIC RILPIVIRINE LEVELS AND POTENTIAL VIROLOGICAL FAILURE WITH RESISTANCE TUMS CAN BE GIVEN BUT NEED CONSULT WITH [facility] RE TIMING. I PREFER HER TO AVOID ALL TOGETHER    Current Outpatient Medications    Medication Sig Dispense Refill   CALCIUM CITRATE-VITAMIN D3 PO Take by mouth. 630 mg of calcium and 13 mcg d3, take once daily     Cholecalciferol (D3 ADULT PO) Take 25 mcg by mouth daily.     Coenzyme Q10 (COQ10) 100 MG CAPS Take by mouth daily.     dexamethasone (DECADRON) 4 MG tablet Take 1-2 tablets (4-8 mg total) by mouth 2 (two) times daily as needed. 40 tablet 6   ELDERBERRY PO Take 50 mg by mouth daily.     HYDROcodone-acetaminophen (NORCO/VICODIN) 5-325  MG tablet Take one tablet 4 times a day as instructed 120 tablet 0   hydrocortisone cream 1 % Apply 1 application topically daily as needed for itching.     JULUCA 50-25 MG TABS TAKE 1 TABLET BY MOUTH DAILY WITH BREAKFAST. TAKE WITH THE PREZCOBIX. 30 tablet 5   levothyroxine (SYNTHROID) 200 MCG tablet Take 1 tablet (200 mcg total) by mouth daily before breakfast. 90 tablet 1   lidocaine-prilocaine (EMLA) cream Apply 1 application topically as needed. Apply to port site 1 hour prior to access 30 g 0   Loratadine 10 MG CAPS Take by mouth as needed.     Menthol, Topical Analgesic, (BENGAY EX) Apply 1 application topically daily as needed (muscle pain).     methocarbamol (ROBAXIN) 500 MG tablet Take 1 tablet (500 mg total) by mouth every 8 (eight) hours as needed for muscle spasms. 90 tablet 1   Methylsulfonylmethane (MSM) 1000 MG TABS Take by mouth daily.     Naphazoline-Glycerin (REDNESS RELIEF OP) Place 1 drop into both eyes daily as needed (redness).     olmesartan-hydrochlorothiazide (BENICAR HCT) 40-25 MG tablet TAKE 1 TABLET BY MOUTH DAILY. 30 tablet 1   Omega-3 Fatty Acids (FISH OIL) 1000 MG CAPS Take by mouth daily.     potassium gluconate 595 (99 K) MG TABS tablet Take 595 mg by mouth daily.     PREZCOBIX 800-150 MG tablet TAKE 1 TABLET BY MOUTH DAILY. SWALLOW WHOLE. DO NOT CRUSH, BREAK OR CHEW TABLETS. TAKE WITH FOOD. 30 tablet 5   Probiotic Product (PROBIOTIC DAILY PO) Take by mouth daily.      prochlorperazine (COMPAZINE) 10 MG tablet Take 1 tablet (10 mg total) by mouth every 6 (six) hours as needed for nausea or vomiting. 30 tablet 4   SELZENTRY 150 MG tablet TAKE ONE TABLET BY MOUTH TWICE DAILY 60 tablet 5   Study - REPRIEVE [phone] - pitavastatin 4 mg or placebo tablet ([place]) Take 1 tablet (4 mg total) by mouth daily. 30 tablet    tobramycin-dexamethasone (TOBRADEX) ophthalmic solution Place 2 drops into the right eye every 4 (four) hours while awake. 5 mL 0   Vitamin E 180 MG CAPS Take by mouth daily.     No current facility-administered medications for this visit.     OBJECTIVE:  Vitals:   01/09/19 1257  BP: 131/73  Pulse: 96  Resp: 18  Temp: 98.3 F (36.8 C)  SpO2: 94%     Body mass index is 37.46 kg/m.    Filed Weights   01/09/19 1257  Weight: 239 lb 3.2 oz (108.5 kg)  ECOG FS: 2  GENERAL: Patient is a well appearing female in no acute distress HEENT:  Sclerae anicteric.  Oropharynx clear and moist. No ulcerations or evidence of oropharyngeal candidiasis. Neck is supple.  NODES:  No cervical, supraclavicular, or axillary lymphadenopathy palpated.  BREAST EXAM:  Deferred. LUNGS:  Slightly diminished in right lower lobe, otherwise clear throughout (improved!).  No wheezes or rhonchi. HEART:  Regular rate and rhythm. No murmur appreciated. ABDOMEN:  Soft, nontender.  Positive, normoactive bowel sounds. No organomegaly palpated. MSK:  No focal spinal tenderness to palpation. Full range of motion bilaterally in the upper extremities. EXTREMITIES:  No peripheral edema.   SKIN:  Clear with no obvious rashes or skin changes. No nail dyscrasia. NEURO:  Nonfocal. Well oriented.  Appropriate affect.    LAB RESULTS:  CMP     Component Value Date/Time   NA 140 01/09/2019  1226   NA 140 05/13/2017 0927   K 3.7 01/09/2019 1226   K 4.1 05/13/2017 0927   CL 104 01/09/2019 1226   CO2 26 01/09/2019 1226   CO2 29 05/13/2017 0927   GLUCOSE 95 01/09/2019 1226     GLUCOSE 102 05/13/2017 0927   BUN 11 01/09/2019 1226   BUN 17.7 05/13/2017 0927   CREATININE 1.36 (H) 01/09/2019 1226   CREATININE 1.44 (H) 01/02/2019 1241   CREATININE 1.62 (H) 09/18/2018 0911   CREATININE 1.4 (H) 05/13/2017 0927   CALCIUM 9.6 01/09/2019 1226   CALCIUM 10.5 (H) 05/13/2017 0927   PROT 8.2 (H) 01/09/2019 1226   PROT 8.1 05/13/2017 0927   ALBUMIN 3.6 01/09/2019 1226   ALBUMIN 3.5 05/13/2017 0927   AST 27 01/09/2019 1226   AST 24 01/02/2019 1241   AST 14 05/13/2017 0927   ALT 15 01/09/2019 1226   ALT 18 01/02/2019 1241   ALT 12 05/13/2017 0927   ALKPHOS 75 01/09/2019 1226   ALKPHOS 90 05/13/2017 0927   BILITOT 0.3 01/09/2019 1226   BILITOT 0.3 01/02/2019 1241   BILITOT 0.27 05/13/2017 0927   GFRNONAA 41 (L) 01/09/2019 1226   GFRNONAA 39 (L) 01/02/2019 1241   GFRNONAA 34 (L) 09/18/2018 0911   GFRAA 48 (L) 01/09/2019 1226   GFRAA 45 (L) 01/02/2019 1241   GFRAA 39 (L) 09/18/2018 0911    No results found for: Ronnald Ramp, A1GS, A2GS, BETS, BETA2SER, GAMS, MSPIKE, SPEI  No results found for: Nils Pyle, [facility]  Lab Results  Component Value Date   WBC 7.3 01/09/2019   NEUTROABS 3.4 01/09/2019   HGB 13.8 01/09/2019   HCT 41.7 01/09/2019   MCV 94.8 01/09/2019   PLT 195 01/09/2019      Chemistry      Component Value Date/Time   NA 140 01/09/2019 1226   NA 140 05/13/2017 0927   K 3.7 01/09/2019 1226   K 4.1 05/13/2017 0927   CL 104 01/09/2019 1226   CO2 26 01/09/2019 1226   CO2 29 05/13/2017 0927   BUN 11 01/09/2019 1226   BUN 17.7 05/13/2017 0927   CREATININE 1.36 (H) 01/09/2019 1226   CREATININE 1.44 (H) 01/02/2019 1241   CREATININE 1.62 (H) 09/18/2018 0911   CREATININE 1.4 (H) 05/13/2017 0927   GLU 104 02/13/2016 1358      Component Value Date/Time   CALCIUM 9.6 01/09/2019 1226   CALCIUM 10.5 (H) 05/13/2017 0927   ALKPHOS 75 01/09/2019 1226   ALKPHOS 90 05/13/2017 0927   AST 27 01/09/2019 1226   AST 24  01/02/2019 1241   AST 14 05/13/2017 0927   ALT 15 01/09/2019 1226   ALT 18 01/02/2019 1241   ALT 12 05/13/2017 0927   BILITOT 0.3 01/09/2019 1226   BILITOT 0.3 01/02/2019 1241   BILITOT 0.27 05/13/2017 0927       Lab Results  Component Value Date   LABCA2 11 04/20/2008    No components found for: [id number]  No results for input(s): INR in the last 168 hours.  Urinalysis    Component Value Date/Time   COLORURINE YELLOW 05/30/2018 1123   APPEARANCEUR CLEAR 05/30/2018 1123   LABSPEC 1.017 05/30/2018 1123   PHURINE 6.0 05/30/2018 1123   GLUCOSEU NEGATIVE 05/30/2018 1123   GLUCOSEU NEG mg/dL 07/23/2006 2113   HGBUR NEGATIVE 05/30/2018 1123   BILIRUBINUR NEGATIVE 05/30/2018 [place] 05/30/2018 [place] 05/30/2018 1123   UROBILINOGEN 1 07/23/2006 2113  NITRITE NEGATIVE 05/30/2018 1123   LEUKOCYTESUR NEGATIVE 05/30/2018 1123     STUDIES: Mm Digital Screening Unilat L  Result Date: 12/22/2018 CLINICAL DATA:  Screening. History of right breast cancer and right mastectomy. EXAM: DIGITAL SCREENING UNILATERAL LEFT MAMMOGRAM WITH CAD COMPARISON:  Previous exam(s). ACR Breast Density Category b: There are scattered areas of fibroglandular density. FINDINGS: In the left breast, a possible mass warrants further evaluation. Patient has had a right mastectomy. Images were processed with CAD. IMPRESSION: Further evaluation is suggested for possible mass in the left breast. RECOMMENDATION: Diagnostic mammogram and possibly ultrasound of the left breast. (Code:[id number]) The patient will be contacted regarding the findings, and additional imaging will be scheduled. BI-RADS CATEGORY  0: Incomplete. Need additional imaging evaluation and/or prior mammograms for comparison. Electronically Signed   By: Curlene Dolphin M.D.   On: 12/22/2018 15:50   Mm Diag Breast Tomo Uni Left  Result Date: 12/25/2018 CLINICAL DATA:  Screening recall for possible left breast mass.  History of metastatic right breast cancer. EXAM: DIGITAL DIAGNOSTIC UNILATERAL LEFT MAMMOGRAM WITH CAD AND TOMO COMPARISON:  Previous exam(s). ACR Breast Density Category b: There are scattered areas of fibroglandular density. FINDINGS: Tomograms were performed of the left breast. No suspicious masses or abnormality seen, with stable mammographic appearance of the left breast dating back to at least 2012. There is no mammographic evidence of malignancy. Mammographic images were processed with CAD. IMPRESSION: No mammographic evidence of malignancy in the left breast. RECOMMENDATION: Screening mammography of the left breast in 1 year. I have discussed the findings and recommendations with the patient. Results were also provided in writing at the conclusion of the visit. If applicable, a reminder letter will be sent to the patient regarding the next appointment. BI-RADS CATEGORY  1: Negative. Electronically Signed   By: Everlean Alstrom M.D.   On: 12/25/2018 16:07      ELIGIBLE FOR AVAILABLE RESEARCH PROTOCOL: no   ASSESSMENT: 62 y.o. [place] woman   (1) status post right lumpectomy and sentinel lymph node sampling October 2005 for a 0.6 cm invasive ductal carcinoma involving one out of 2 sentinel lymph nodes sampled, grade 3, triple-negative, treated adjuvantly with doxorubicin and cyclophosphamide 4 followed by weekly paclitaxel 7, followed by adjuvant radiation  RECURRENT DISEASE: (2) status post right breast upper outer quadrant biopsy and right axillary lymph node biopsy 10/01/2016, both positive for a T2 N1, stage IIIB invasive ductal carcinoma, triple negative, with an MIB-1 of 50-70%  (3) status post right modified radical mastectomy 11/05/2016 showing a pT2 pN1, stage IIIB invasive ductal carcinoma, grade 3, triple negative, with negative margins  (4) not a candidate for radiation given prior history  (5) adjuvant chemotherapy consisting of carboplatin and gemcitabine given days 1 and 8  of each 21 day cycle, for 6 cycles, starting 11/20/2016, completed 03/11/2017  (a) day 8 cycle 2 omitted because of neutropenia; Neupogen/Neulasta added  (5) HIV positivity: under care of Id Lucianne [place])  (6) Genetic testing 06/17/2017:  no pathogenic mutations. Genes tested: APC, ATM, AXIN2, BARD1, BLM, BMPR1A, BRCA1, BRCA2, BRIP1, CDH1, CDK4, CDKN2A (p14ARF), CDKN2A (p16INK4a), CEBPA, CHEK2, CTNNA1, DICER1, EPCAM*, GATA2, GREM1*, HRAS, KIT, MEN1, MLH1, MSH2, MSH3, MSH6, MUTYH, NBN, NF1, PALB2, PDGFRA, PMS2, POLD1, POLE, PTEN, RAD50, RAD51C, RAD51D, RUNX1, SDHB, SDHC, SDHD, SMAD4, SMARCA4, STK11, TERC, TERT, TP53, TSC1, TSC2, VHL. The following genes were evaluated for sequence changes only: HOXB13*, NTHL1*, SDHA.   (a) A variant of uncertain significance (VUS) in a gene called NTHL1 was also  noted. c.736G>A (p.Ala246Thr)  METASTATIC DISEASE: November 2019 (1) Patient seen in urgent care and ultimately ED on 10/21 for shortness of breath, chest xray demonstrated Right pleural effusion.   (a) right thoracenteses on 10/21 and 11/4 results show atypical cells, non diagnostic (b) CT chest 04/22/2018 shows re-accumulation of fluid, and right pleural nodularity. (c) PET scan on 05/01/2018 shows hypermetabolic pleural based metastases, right CP angle nodal metastases, no evidence of malignancy in abdomen and pelvis. (d) bronchoscopy with biopsy by Dr. Lamonte Sakai and BAL on 05/15/2018 was non diagnostic  (e) VAT biopsy of the right pleura 05/30/2018 confirms carcinoma, again triple negative  (f) [facility] shows PD-L1 positive (1% in [facility]); otherwise microsatellite stable, TMB low (5/Mb), no PIK3 mutations; other mutations suggest sensitivity to MTOR inhibitors and several TKIs  (2) Atezolizumab started 07/15/2018 given every other week  (a) PET 08/13/2018 shows tumor Right hemithorax (new baseline study)  (b) Atezo changed to Q4w starting with 11/07/2018 dose  (c) PET 11/28/2018 documents progression in the  right lung and pleural area as well as lymph nodes, and a T1 skeletal metastasis  (d) atezolizumab discontinued after 12/05/2018 dose  (3) eribulin day 1 and 8 of every 21 day cycle started 12/19/2018  (a) changed to every other week with Neulsta support due to neutropenia causing treatment delays  PLAN:  Kaliyan is doing well today.  She has no clinical signs of breast cancer progression.  Her WBC improved with the two doses of granix and delaying her chemotherapy by one week.  She will receive Eribulin today and receive Onpro to prevent neutropenia and further delays in treatment.  I reviewed that restaging will be done after 6 doses of eribulin, or sooner if she develops concerns for progression.  She understands this.    Aarica and I reviewed her constipation.  I recommended that she add miralax to her regimen.    Syncere and I reviewed healthy weight loss, and I commended her for making healthier choices with increasing her fruit and vegetable intake.  I recommended that she ensure that she gets enough protein, as it is such an important building block in our body.  She understands this.    Kahla will return in 2 weeks for labs, f/u, and Eribulin.  She was recommended to continue with the appropriate pandemic precautions. A total of (20) minutes of face-to-face time was spent with this patient with greater than 50% of that time in counseling and care-coordination.   Wilber Bihari, NP 01/09/19 1:27 PM Medical Oncology and Hematology [facility] [address] [place], [place] [address] Tel. [phone]    Fax. [phone]

## 2019-01-09 ENCOUNTER — Inpatient Hospital Stay: Payer: Medicare HMO

## 2019-01-09 ENCOUNTER — Inpatient Hospital Stay (HOSPITAL_BASED_OUTPATIENT_CLINIC_OR_DEPARTMENT_OTHER): Payer: Medicare HMO | Admitting: Adult Health

## 2019-01-09 ENCOUNTER — Other Ambulatory Visit: Payer: Self-pay

## 2019-01-09 ENCOUNTER — Encounter: Payer: Self-pay | Admitting: Adult Health

## 2019-01-09 VITALS — BP 131/73 | HR 96 | Temp 98.3°F | Resp 18 | Ht 67.0 in | Wt 239.2 lb

## 2019-01-09 DIAGNOSIS — C50411 Malignant neoplasm of upper-outer quadrant of right female breast: Secondary | ICD-10-CM

## 2019-01-09 DIAGNOSIS — Z79899 Other long term (current) drug therapy: Secondary | ICD-10-CM

## 2019-01-09 DIAGNOSIS — Z853 Personal history of malignant neoplasm of breast: Secondary | ICD-10-CM

## 2019-01-09 DIAGNOSIS — Z807 Family history of other malignant neoplasms of lymphoid, hematopoietic and related tissues: Secondary | ICD-10-CM

## 2019-01-09 DIAGNOSIS — D701 Agranulocytosis secondary to cancer chemotherapy: Secondary | ICD-10-CM | POA: Diagnosis not present

## 2019-01-09 DIAGNOSIS — Z5111 Encounter for antineoplastic chemotherapy: Secondary | ICD-10-CM | POA: Diagnosis not present

## 2019-01-09 DIAGNOSIS — Z171 Estrogen receptor negative status [ER-]: Secondary | ICD-10-CM | POA: Diagnosis not present

## 2019-01-09 DIAGNOSIS — Z21 Asymptomatic human immunodeficiency virus [HIV] infection status: Secondary | ICD-10-CM | POA: Diagnosis not present

## 2019-01-09 DIAGNOSIS — C7801 Secondary malignant neoplasm of right lung: Secondary | ICD-10-CM

## 2019-01-09 DIAGNOSIS — Z923 Personal history of irradiation: Secondary | ICD-10-CM | POA: Diagnosis not present

## 2019-01-09 DIAGNOSIS — K5900 Constipation, unspecified: Secondary | ICD-10-CM

## 2019-01-09 DIAGNOSIS — Z801 Family history of malignant neoplasm of trachea, bronchus and lung: Secondary | ICD-10-CM

## 2019-01-09 DIAGNOSIS — N289 Disorder of kidney and ureter, unspecified: Secondary | ICD-10-CM

## 2019-01-09 DIAGNOSIS — Z9011 Acquired absence of right breast and nipple: Secondary | ICD-10-CM

## 2019-01-09 DIAGNOSIS — C787 Secondary malignant neoplasm of liver and intrahepatic bile duct: Secondary | ICD-10-CM

## 2019-01-09 DIAGNOSIS — B20 Human immunodeficiency virus [HIV] disease: Secondary | ICD-10-CM

## 2019-01-09 DIAGNOSIS — C782 Secondary malignant neoplasm of pleura: Secondary | ICD-10-CM | POA: Diagnosis not present

## 2019-01-09 DIAGNOSIS — C50911 Malignant neoplasm of unspecified site of right female breast: Secondary | ICD-10-CM

## 2019-01-09 DIAGNOSIS — N183 Chronic kidney disease, stage 3 unspecified: Secondary | ICD-10-CM

## 2019-01-09 DIAGNOSIS — J90 Pleural effusion, not elsewhere classified: Secondary | ICD-10-CM

## 2019-01-09 DIAGNOSIS — Z95828 Presence of other vascular implants and grafts: Secondary | ICD-10-CM

## 2019-01-09 DIAGNOSIS — C7951 Secondary malignant neoplasm of bone: Secondary | ICD-10-CM

## 2019-01-09 LAB — CBC WITH DIFFERENTIAL (CANCER CENTER ONLY)
Abs Immature Granulocytes: 0.39 10*3/uL — ABNORMAL HIGH (ref 0.00–0.07)
Basophils Absolute: 0.1 10*3/uL (ref 0.0–0.1)
Basophils Relative: 2 %
Eosinophils Absolute: 0.2 10*3/uL (ref 0.0–0.5)
Eosinophils Relative: 3 %
HCT: 41.7 % (ref 36.0–46.0)
Hemoglobin: 13.8 g/dL (ref 12.0–15.0)
Immature Granulocytes: 5 %
Lymphocytes Relative: 30 %
Lymphs Abs: 2.2 10*3/uL (ref 0.7–4.0)
MCH: 31.4 pg (ref 26.0–34.0)
MCHC: 33.1 g/dL (ref 30.0–36.0)
MCV: 94.8 fL (ref 80.0–100.0)
Monocytes Absolute: 1.1 10*3/uL — ABNORMAL HIGH (ref 0.1–1.0)
Monocytes Relative: 14 %
Neutro Abs: 3.4 10*3/uL (ref 1.7–7.7)
Neutrophils Relative %: 46 %
Platelet Count: 195 10*3/uL (ref 150–400)
RBC: 4.4 MIL/uL (ref 3.87–5.11)
RDW: 14.1 % (ref 11.5–15.5)
WBC Count: 7.3 10*3/uL (ref 4.0–10.5)
nRBC: 0 % (ref 0.0–0.2)

## 2019-01-09 LAB — COMPREHENSIVE METABOLIC PANEL
ALT: 15 U/L (ref 0–44)
AST: 27 U/L (ref 15–41)
Albumin: 3.6 g/dL (ref 3.5–5.0)
Alkaline Phosphatase: 75 U/L (ref 38–126)
Anion gap: 10 (ref 5–15)
BUN: 11 mg/dL (ref 8–23)
CO2: 26 mmol/L (ref 22–32)
Calcium: 9.6 mg/dL (ref 8.9–10.3)
Chloride: 104 mmol/L (ref 98–111)
Creatinine, Ser: 1.36 mg/dL — ABNORMAL HIGH (ref 0.44–1.00)
GFR calc Af Amer: 48 mL/min — ABNORMAL LOW (ref >60)
GFR calc non Af Amer: 41 mL/min — ABNORMAL LOW (ref >60)
Glucose, Bld: 95 mg/dL (ref 70–99)
Potassium: 3.7 mmol/L (ref 3.5–5.1)
Sodium: 140 mmol/L (ref 135–145)
Total Bilirubin: 0.3 mg/dL (ref 0.3–1.2)
Total Protein: 8.2 g/dL — ABNORMAL HIGH (ref 6.5–8.1)

## 2019-01-09 LAB — TSH: TSH: 7.373 u[IU]/mL — ABNORMAL HIGH (ref 0.308–3.960)

## 2019-01-09 MED ORDER — HEPARIN SOD (PORK) LOCK FLUSH 100 UNIT/ML IV SOLN
500.0000 [IU] | Freq: Once | INTRAVENOUS | Status: AC | PRN
  Administered 2019-01-09: 500 [IU]
  Filled 2019-01-09: qty 5

## 2019-01-09 MED ORDER — SODIUM CHLORIDE 0.9 % IV SOLN
1.2000 mg/m2 | Freq: Once | INTRAVENOUS | Status: AC
  Administered 2019-01-09: 2.75 mg via INTRAVENOUS
  Filled 2019-01-09: qty 5.5

## 2019-01-09 MED ORDER — SODIUM CHLORIDE 0.9% FLUSH
10.0000 mL | Freq: Once | INTRAVENOUS | Status: AC
  Administered 2019-01-09: 10 mL
  Filled 2019-01-09: qty 10

## 2019-01-09 MED ORDER — PROCHLORPERAZINE MALEATE 10 MG PO TABS
10.0000 mg | ORAL_TABLET | Freq: Once | ORAL | Status: AC
  Administered 2019-01-09: 10 mg via ORAL

## 2019-01-09 MED ORDER — SODIUM CHLORIDE 0.9% FLUSH
10.0000 mL | INTRAVENOUS | Status: DC | PRN
  Administered 2019-01-09: 10 mL
  Filled 2019-01-09: qty 10

## 2019-01-09 MED ORDER — PEGFILGRASTIM 6 MG/0.6ML ~~LOC~~ PSKT
6.0000 mg | PREFILLED_SYRINGE | Freq: Once | SUBCUTANEOUS | Status: AC
  Administered 2019-01-09: 6 mg via SUBCUTANEOUS

## 2019-01-09 MED ORDER — SODIUM CHLORIDE 0.9 % IV SOLN
Freq: Once | INTRAVENOUS | Status: AC
  Administered 2019-01-09: 15:00:00 via INTRAVENOUS
  Filled 2019-01-09: qty 250

## 2019-01-09 MED ORDER — PROCHLORPERAZINE MALEATE 10 MG PO TABS
ORAL_TABLET | ORAL | Status: AC
  Filled 2019-01-09: qty 1

## 2019-01-09 MED ORDER — PEGFILGRASTIM 6 MG/0.6ML ~~LOC~~ PSKT
PREFILLED_SYRINGE | SUBCUTANEOUS | Status: AC
  Filled 2019-01-09: qty 0.6

## 2019-01-09 NOTE — Patient Instructions (Signed)
[facility] Discharge Instructions for Patients Receiving Chemotherapy  Today you received the following chemotherapy agents [place].   To help prevent nausea and vomiting after your treatment, we encourage you to take your nausea medication as directed  If you develop nausea and vomiting that is not controlled by your nausea medication, call the clinic.   BELOW ARE SYMPTOMS THAT SHOULD BE REPORTED IMMEDIATELY:  *FEVER GREATER THAN 100.5 F  *CHILLS WITH OR WITHOUT FEVER  NAUSEA AND VOMITING THAT IS NOT CONTROLLED WITH YOUR NAUSEA MEDICATION  *UNUSUAL SHORTNESS OF BREATH  *UNUSUAL BRUISING OR BLEEDING  TENDERNESS IN MOUTH AND THROAT WITH OR WITHOUT PRESENCE OF ULCERS  *URINARY PROBLEMS  *BOWEL PROBLEMS  UNUSUAL RASH Items with * indicate a potential emergency and should be followed up as soon as possible.  Feel free to call the clinic you have any questions or concerns. The clinic phone number is [phone].  Pegfilgrastim injection What is this medicine? PEGFILGRASTIM (PEG fil gra stim) is a long-acting granulocyte colony-stimulating factor that stimulates the growth of neutrophils, a type of white blood cell important in the body's fight against infection. It is used to reduce the incidence of fever and infection in patients with certain types of cancer who are receiving chemotherapy that affects the bone marrow, and to increase survival after being exposed to high doses of radiation. This medicine may be used for other purposes; ask your health care provider or pharmacist if you have questions. COMMON BRAND NAME(S): Steve Rattler, Ziextenzo What should I tell my health care provider before I take this medicine? They need to know if you have any of these conditions:  kidney disease  latex allergy  ongoing radiation therapy  sickle cell disease  skin reactions to acrylic adhesives (On-Body Injector only)  an unusual or allergic reaction  to pegfilgrastim, filgrastim, other medicines, foods, dyes, or preservatives  pregnant or trying to get pregnant  breast-feeding How should I use this medicine? This medicine is for injection under the skin. If you get this medicine at home, you will be taught how to prepare and give the pre-filled syringe or how to use the On-body Injector. Refer to the patient Instructions for Use for detailed instructions. Use exactly as directed. Tell your healthcare provider immediately if you suspect that the On-body Injector may not have performed as intended or if you suspect the use of the On-body Injector resulted in a missed or partial dose. It is important that you put your used needles and syringes in a special sharps container. Do not put them in a trash can. If you do not have a sharps container, call your pharmacist or healthcare provider to get one. Talk to your pediatrician regarding the use of this medicine in children. While this drug may be prescribed for selected conditions, precautions do apply. Overdosage: If you think you have taken too much of this medicine contact a poison control center or emergency room at once. NOTE: This medicine is only for you. Do not share this medicine with others. What if I miss a dose? It is important not to miss your dose. Call your doctor or health care professional if you miss your dose. If you miss a dose due to an On-body Injector failure or leakage, a new dose should be administered as soon as possible using a single prefilled syringe for manual use. What may interact with this medicine? Interactions have not been studied. Give your health care provider a list of all  the medicines, herbs, non-prescription drugs, or dietary supplements you use. Also tell them if you smoke, drink alcohol, or use illegal drugs. Some items may interact with your medicine. This list may not describe all possible interactions. Give your health care provider a list of all the  medicines, herbs, non-prescription drugs, or dietary supplements you use. Also tell them if you smoke, drink alcohol, or use illegal drugs. Some items may interact with your medicine. What should I watch for while using this medicine? You may need blood work done while you are taking this medicine. If you are going to need a MRI, CT scan, or other procedure, tell your doctor that you are using this medicine (On-Body Injector only). What side effects may I notice from receiving this medicine? Side effects that you should report to your doctor or health care professional as soon as possible:  allergic reactions like skin rash, itching or hives, swelling of the face, lips, or tongue  back pain  dizziness  fever  pain, redness, or irritation at site where injected  pinpoint red spots on the skin  red or dark-brown urine  shortness of breath or breathing problems  stomach or side pain, or pain at the shoulder  swelling  tiredness  trouble passing urine or change in the amount of urine Side effects that usually do not require medical attention (report to your doctor or health care professional if they continue or are bothersome):  bone pain  muscle pain This list may not describe all possible side effects. Call your doctor for medical advice about side effects. You may report side effects to FDA at 1-800-FDA-1088. Where should I keep my medicine? Keep out of the reach of children. If you are using this medicine at home, you will be instructed on how to store it. Throw away any unused medicine after the expiration date on the label. NOTE: This sheet is a summary. It may not cover all possible information. If you have questions about this medicine, talk to your doctor, pharmacist, or health care provider.  2020 [employer] (2017-09-16 16:57:08)

## 2019-01-13 ENCOUNTER — Telehealth: Payer: Self-pay

## 2019-01-13 NOTE — Telephone Encounter (Signed)
-----   Message from Gardenia Phlegm, NP sent at 01/13/2019  7:57 AM EDT ----- Please clal patient.  Is she taking Synthroid.  If so, how is she taking it?   ----- Message ----- From: Buel Ream, [facility] [place] Sent: 01/09/2019   2:24 PM EDT To: Chauncey Cruel, MD

## 2019-01-13 NOTE — Telephone Encounter (Signed)
Spoke with patient inquiring if she is taking Synthroid.  Patient stated that her PCP had just upped her dosage to 200 mcg and that she had just started it this past Saturday.  She takes medication first thing in the morning with water.

## 2019-01-23 ENCOUNTER — Other Ambulatory Visit: Payer: Self-pay | Admitting: Oncology

## 2019-01-23 ENCOUNTER — Inpatient Hospital Stay: Payer: Medicare HMO

## 2019-01-23 ENCOUNTER — Inpatient Hospital Stay (HOSPITAL_BASED_OUTPATIENT_CLINIC_OR_DEPARTMENT_OTHER): Payer: Medicare HMO | Admitting: Adult Health

## 2019-01-23 ENCOUNTER — Other Ambulatory Visit: Payer: Self-pay

## 2019-01-23 VITALS — BP 119/72 | HR 93 | Temp 98.3°F | Resp 18 | Ht 67.0 in | Wt 238.8 lb

## 2019-01-23 DIAGNOSIS — M2550 Pain in unspecified joint: Secondary | ICD-10-CM

## 2019-01-23 DIAGNOSIS — C7951 Secondary malignant neoplasm of bone: Secondary | ICD-10-CM | POA: Diagnosis not present

## 2019-01-23 DIAGNOSIS — J90 Pleural effusion, not elsewhere classified: Secondary | ICD-10-CM

## 2019-01-23 DIAGNOSIS — D701 Agranulocytosis secondary to cancer chemotherapy: Secondary | ICD-10-CM | POA: Diagnosis not present

## 2019-01-23 DIAGNOSIS — Z807 Family history of other malignant neoplasms of lymphoid, hematopoietic and related tissues: Secondary | ICD-10-CM

## 2019-01-23 DIAGNOSIS — C782 Secondary malignant neoplasm of pleura: Secondary | ICD-10-CM | POA: Diagnosis not present

## 2019-01-23 DIAGNOSIS — Z5111 Encounter for antineoplastic chemotherapy: Secondary | ICD-10-CM | POA: Diagnosis not present

## 2019-01-23 DIAGNOSIS — Z95828 Presence of other vascular implants and grafts: Secondary | ICD-10-CM

## 2019-01-23 DIAGNOSIS — C50411 Malignant neoplasm of upper-outer quadrant of right female breast: Secondary | ICD-10-CM

## 2019-01-23 DIAGNOSIS — Z21 Asymptomatic human immunodeficiency virus [HIV] infection status: Secondary | ICD-10-CM | POA: Diagnosis not present

## 2019-01-23 DIAGNOSIS — C50911 Malignant neoplasm of unspecified site of right female breast: Secondary | ICD-10-CM

## 2019-01-23 DIAGNOSIS — Z171 Estrogen receptor negative status [ER-]: Secondary | ICD-10-CM

## 2019-01-23 DIAGNOSIS — C7801 Secondary malignant neoplasm of right lung: Secondary | ICD-10-CM

## 2019-01-23 DIAGNOSIS — R05 Cough: Secondary | ICD-10-CM

## 2019-01-23 DIAGNOSIS — L538 Other specified erythematous conditions: Secondary | ICD-10-CM | POA: Diagnosis not present

## 2019-01-23 DIAGNOSIS — Z9013 Acquired absence of bilateral breasts and nipples: Secondary | ICD-10-CM

## 2019-01-23 DIAGNOSIS — Z9011 Acquired absence of right breast and nipple: Secondary | ICD-10-CM | POA: Diagnosis not present

## 2019-01-23 DIAGNOSIS — Z923 Personal history of irradiation: Secondary | ICD-10-CM | POA: Diagnosis not present

## 2019-01-23 DIAGNOSIS — Z801 Family history of malignant neoplasm of trachea, bronchus and lung: Secondary | ICD-10-CM

## 2019-01-23 DIAGNOSIS — Z79899 Other long term (current) drug therapy: Secondary | ICD-10-CM

## 2019-01-23 LAB — CMP (CANCER CENTER ONLY)
ALT: 18 U/L (ref 0–44)
AST: 25 U/L (ref 15–41)
Albumin: 3.8 g/dL (ref 3.5–5.0)
Alkaline Phosphatase: 70 U/L (ref 38–126)
Anion gap: 10 (ref 5–15)
BUN: 19 mg/dL (ref 8–23)
CO2: 27 mmol/L (ref 22–32)
Calcium: 10.5 mg/dL — ABNORMAL HIGH (ref 8.9–10.3)
Chloride: 104 mmol/L (ref 98–111)
Creatinine: 1.5 mg/dL — ABNORMAL HIGH (ref 0.44–1.00)
GFR, Est AFR Am: 43 mL/min — ABNORMAL LOW (ref >60)
GFR, Estimated: 37 mL/min — ABNORMAL LOW (ref >60)
Glucose, Bld: 85 mg/dL (ref 70–99)
Potassium: 3.5 mmol/L (ref 3.5–5.1)
Sodium: 141 mmol/L (ref 135–145)
Total Bilirubin: 0.4 mg/dL (ref 0.3–1.2)
Total Protein: 8.2 g/dL — ABNORMAL HIGH (ref 6.5–8.1)

## 2019-01-23 LAB — CBC WITH DIFFERENTIAL (CANCER CENTER ONLY)
Abs Immature Granulocytes: 0.06 10*3/uL (ref 0.00–0.07)
Basophils Absolute: 0.1 10*3/uL (ref 0.0–0.1)
Basophils Relative: 1 %
Eosinophils Absolute: 0.2 10*3/uL (ref 0.0–0.5)
Eosinophils Relative: 2 %
HCT: 38.9 % (ref 36.0–46.0)
Hemoglobin: 12.7 g/dL (ref 12.0–15.0)
Immature Granulocytes: 1 %
Lymphocytes Relative: 22 %
Lymphs Abs: 2.2 10*3/uL (ref 0.7–4.0)
MCH: 31.1 pg (ref 26.0–34.0)
MCHC: 32.6 g/dL (ref 30.0–36.0)
MCV: 95.3 fL (ref 80.0–100.0)
Monocytes Absolute: 1.1 10*3/uL — ABNORMAL HIGH (ref 0.1–1.0)
Monocytes Relative: 12 %
Neutro Abs: 6.2 10*3/uL (ref 1.7–7.7)
Neutrophils Relative %: 62 %
Platelet Count: 265 10*3/uL (ref 150–400)
RBC: 4.08 MIL/uL (ref 3.87–5.11)
RDW: 14.6 % (ref 11.5–15.5)
WBC Count: 9.9 10*3/uL (ref 4.0–10.5)
nRBC: 0 % (ref 0.0–0.2)

## 2019-01-23 MED ORDER — SODIUM CHLORIDE 0.9% FLUSH
10.0000 mL | INTRAVENOUS | Status: DC | PRN
  Administered 2019-01-23: 10 mL
  Filled 2019-01-23: qty 10

## 2019-01-23 MED ORDER — PEGFILGRASTIM 6 MG/0.6ML ~~LOC~~ PSKT
6.0000 mg | PREFILLED_SYRINGE | Freq: Once | SUBCUTANEOUS | Status: AC
  Administered 2019-01-23: 6 mg via SUBCUTANEOUS

## 2019-01-23 MED ORDER — PEGFILGRASTIM 6 MG/0.6ML ~~LOC~~ PSKT
PREFILLED_SYRINGE | SUBCUTANEOUS | Status: AC
  Filled 2019-01-23: qty 0.6

## 2019-01-23 MED ORDER — SODIUM CHLORIDE 0.9% FLUSH
10.0000 mL | Freq: Once | INTRAVENOUS | Status: AC
  Administered 2019-01-23: 13:00:00 10 mL
  Filled 2019-01-23: qty 10

## 2019-01-23 MED ORDER — HEPARIN SOD (PORK) LOCK FLUSH 100 UNIT/ML IV SOLN
500.0000 [IU] | Freq: Once | INTRAVENOUS | Status: AC | PRN
  Administered 2019-01-23: 500 [IU]
  Filled 2019-01-23: qty 5

## 2019-01-23 MED ORDER — SODIUM CHLORIDE 0.9 % IV SOLN
1.2000 mg/m2 | Freq: Once | INTRAVENOUS | Status: AC
  Administered 2019-01-23: 2.75 mg via INTRAVENOUS
  Filled 2019-01-23: qty 5.5

## 2019-01-23 MED ORDER — PROCHLORPERAZINE MALEATE 10 MG PO TABS
10.0000 mg | ORAL_TABLET | Freq: Once | ORAL | Status: AC
  Administered 2019-01-23: 10 mg via ORAL
  Filled 2019-01-23: qty 1

## 2019-01-23 MED ORDER — SODIUM CHLORIDE 0.9 % IV SOLN
Freq: Once | INTRAVENOUS | Status: AC
  Administered 2019-01-23: 15:00:00 via INTRAVENOUS
  Filled 2019-01-23: qty 250

## 2019-01-23 NOTE — Progress Notes (Signed)
[place]  [phone] [phone]) [phone]    ID: AUTIE VASUDEVAN DOB: 05-27-1956  MR#: [id number]  LNL#:[id number]  Patient Care Team: Lauree Chandler, NP as PCP - General (Nurse Practitioner) Tommy Medal, Lavell Islam, MD as PCP - Infectious Diseases (Infectious Diseases) Clent Jacks, MD as Consulting Physician (Ophthalmology) Renato Shin, MD as Consulting Physician (Endocrinology) Frederik Pear, MD as Consulting Physician (Orthopedic Surgery) Magrinat, Virgie Dad, MD as Consulting Physician (Oncology) Fanny Skates, MD as Consulting Physician (General Surgery) Donnamae Jude, MD as Consulting Physician (Obstetrics and Gynecology) Delice Bison, Charlestine Massed, NP as Nurse Practitioner (Hematology and Oncology) Webb Laws, [place] as Referring Physician (Optometry) OTHER MD:   CHIEF COMPLAINT: Triple negative breast cancer, recurrent  CURRENT TREATMENT:  eribulin  INTERVAL HISTORY: Abbygayle returns today for follow-up and treatment of her recurrent triple negative breast cancer.  She had been on atezolizumab. She tolerated that well but there was evidence of disease progression. She is receiving Eribulin.   She will now receive Eribulin every 2 weeks with Onpro. She is here for Day 15 of Cycle 2.   REVIEW OF SYSTEMS: Jazmaine is doing well today. She previously experienced insomnia secondary to pain. Her pain regimen was modified to include a muscle relaxer before bed as well as Hydrocodone 2-3 times per day. This has significantly improved her pain as well as her ability to sleep. She also uses loradaine for bone pain secondary to Onpro. She also previously had experienced a dry cough. She recently installed a humidifier in her room with has substantially improved her symptoms. She denies any more cough.   Her only concerns today are continued constipation as well as left sclera/eye erythema secondary to scratching her eye with her fingernail yesterday.  She denies any visual changes, lacrimation, drainage, eye lid swelling, or pain/pain with EOM. Regarding her constipation, this is likely secondary to her pain medication use. She takes 1 senna during the day for constipation. Her last bowel movement was last night. She states she has a bowel movement once every 3 days or so.   Otherwise she is feeling well. She denies any fever, chills, night sweats, or weight loss. She uses a meal replacement drink (Equate) in the morning. She denies any chest pain, shortness of breath, cough, or hemoptysis. She denies any nausea, vomiting, diarrhea, or constipation. She denies any headaches or visual changes. She has no new rashes or skin changes. A detailed ROS was otherwise non-contributory.   BREAST CANCER HISTORY: From the original intake note:  Cindel is a history of right-sided breast cancer dating back to 2005. She had a lumpectomy with sentinel lymph node sampling, chemotherapy, and radiation. I do not have access to those records at present.  More recently she had bilateral screening mammography at the [facility] 09/25/2016 showing a possible mass in the right breast. Diagnostic mammography with ultrasonography on 09/28/2016 the patient underwent right diagnostic mammography with tomography and right breast ultrasonography. The breast density was category A. In the right breast at the 10:00 position there was an irregular mass measuring 2.5 cm. Ultrasound identified this the 10:00 radiant 10 cm from the nipple measuring 2.4 cm. In the right axilla there was an abnormal lymph node measuring 1.3 cm with other normal-appearing lymph nodes.  On 10/01/2016 she  underwent biopsy of the right breast mass in question as well as the suspicious axillary lymph node. Both were positive for invasive ductal carcinoma, grade 3, estrogen and progesterone receptor negative, HER-2 nonamplified, the  signals ratio being 1.44-1.47 and the number per cell 2.95-2.20. The  proliferation marker was 70% in the breast lesion and 50% in the lymph node.  Her subsequent history is as detailed below.   PAST MEDICAL HISTORY: Past Medical History:  Diagnosis Date   Alopecia areata 11/28/2009   Bell's palsy    Cancer ([place]) 2005   Breast cancer   chemotherapy and radiation   CKD (chronic kidney disease) stage 3, GFR 30-59 ml/min (HCC) 06/08/2015   Dry eye syndrome    Family history of lung cancer    Family history of non-Hodgkin's lymphoma    Fasting hyperglycemia    Gestational diabetes    2001   HIP FRACTURE, RIGHT 05/06/2008   History of kidney stones    HIV DISEASE 03/27/2006   HYPERLIPIDEMIA, MIXED 12/15/2007   HYPERTENSION 03/27/2006   HYPOTHYROIDISM, POST-RADIATION 06/28/2008   MENORRHAGIA, POSTMENOPAUSAL 02/03/2009   Osteoarthritis of left knee 06/08/2015   OSTEOARTHROSIS, LOCAL, SCND, UNSPC SITE 04/07/2007   Personal history of chemotherapy 11/2018   PVD 04/07/2007   Tinea capitis    TRIGGER FINGER 05/06/2008   Unspecified vitamin D deficiency 08/06/2007    PAST SURGICAL HISTORY: Past Surgical History:  Procedure Laterality Date   BREAST LUMPECTOMY Right    2005   CHEST TUBE INSERTION Right 05/30/2018   Procedure: INSERTION PLEURAL DRAINAGE CATHETER;  Surgeon: Grace Isaac, MD;  [facility];  Service: Thoracic;  Laterality: Right;   COLONOSCOPY     COLONOSCOPY WITH ESOPHAGOGASTRODUODENOSCOPY (EGD)     ENDOBRONCHIAL ULTRASOUND Bilateral 05/15/2018   Procedure: ENDOBRONCHIAL ULTRASOUND;  Surgeon: Collene Gobble, MD;  Location: [facility] END[facility]COPY;  Service: Cardiopulmonary;  Laterality: Bilateral;   FINE NEEDLE ASPIRATION BIOPSY  05/15/2018   Procedure: FINE NEEDLE ASPIRATION BIOPSY;  Surgeon: Collene Gobble, MD;  Location: [facility];  Service: Cardiopulmonary;;   FLEXIBLE BRONCHOSCOPY  05/15/2018   Procedure: FLEXIBLE BRONCHOSCOPY;  Surgeon: Collene Gobble, MD;  Location: [facility];  Service:  Cardiopulmonary;;   HYSTEROSCOPY  2006   IR IMAGING GUIDED PORT INSERTION  07/14/2018   IR THORACENTESIS ASP PLEURAL SPACE W/IMG GUIDE  04/14/2018   IR THORACENTESIS ASP PLEURAL SPACE W/IMG GUIDE  04/28/2018   KNEE ARTHROSCOPY Left 2002   LYMPH NODE DISSECTION  2005   MASTECTOMY Right    MASTECTOMY MODIFIED RADICAL Right 11/05/2016   Procedure: RIGHT MASTECTOMY MODIFIED RADICAL;  Surgeon: Fanny Skates, MD;  Location: [facility];  Service: General;  Laterality: Right;   MODIFIED RADICAL MASTECTOMY Right 11/05/2016   placement   of port-a-cath     PLEURAL BIOPSY Right 05/30/2018   Procedure: PLEURAL BIOPSY;  Surgeon: Grace Isaac, MD;  Location: [facility];  Service: Thoracic;  Laterality: Right;   PLEURAL EFFUSION DRAINAGE Right 05/30/2018   Procedure: DRAINAGE OF PLEURAL EFFUSION;  Surgeon: Grace Isaac, MD;  Location: [facility];  Service: Thoracic;  Laterality: Right;   PORT-A-CATH REMOVAL  2006   insertion 2005   PORT-A-CATH REMOVAL N/A 03/29/2017   Procedure: REMOVAL PORT-A-CATH;  Surgeon: Fanny Skates, MD;  Location: [facility];  Service: General;  Laterality: N/A;   PORTACATH PLACEMENT Right 11/05/2016   Procedure: INSERTION PORT-A-CATH WITH ULTRA SOUND;  Surgeon: Fanny Skates, MD;  Location: [facility];  Service: General;  Laterality: Right;   removal of port a cath  12/2014   TALC PLEURODESIS Right 05/30/2018   Procedure: Pietro Cassis;  Surgeon: Grace Isaac, MD;  Location: [facility];  Service: Thoracic;  Laterality: Right;  THYROID SURGERY  2009   Ablation    TOTAL KNEE ARTHROPLASTY Left 02/06/2016   Procedure: TOTAL KNEE ARTHROPLASTY;  Surgeon: Frederik Pear, MD;  Location: [facility];  Service: Orthopedics;  Laterality: Left;   TUBAL LIGATION     VIDEO ASSISTED THORACOSCOPY Right 05/30/2018   Procedure: VIDEO ASSISTED THORACOSCOPY;  Surgeon: Grace Isaac, MD;  Location: [facility] OR;  Service: Thoracic;  Laterality: Right;    FAMILY HISTORY Family History  Problem  Relation Age of Onset   Heart attack Brother        Massive MI in 13s   Stroke Brother        CAD   Kidney disease Mother    Stroke Mother    Diabetes Mother    Liver disease Sister    COPD Sister        had I-131 rx of hyperthyroidism   Diabetes Sister    Stroke Sister    Lung cancer Paternal Uncle        hx smoking   Cancer Cousin    Non-Hodgkin's lymphoma Cousin 25       cancer x3, in prostate and lung- unsure if met/spread or if primaries   Heart failure Father    Heart disease Father    Arthritis Father    Sarcoidosis Sister    The patient's father died at the age of 45 in the setting of Alzheimer's disease. The patient's mother died at the age of 83 from complications of diabetes. The patient has 3 brothers, 4 sisters. There is no history of breast or ovarian cancer in the family area    GYNECOLOGIC HISTORY:  Patient's last menstrual period was 11/06/2003.  menarche age 68, first live birth age 8, the patient is [place] P2. She stopped having periods in 2005, with her chemotherapy; she never took hormone replacement    SOCIAL HISTORY:  Robynn worked for the [employer] more than 20 years. 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 At home she lives with her daughter Orland Dec who works for [employer] and her granddaughter Donita Brooks, 75 y/o as of APRIL 2018.  The patient's son also drops infrequently.  They are all careful regarding pandemic precautions    ADVANCED DIRECTIVES:  not in place    HEALTH MAINTENANCE: Social History   Tobacco Use   Smoking status: Never Smoker   Smokeless tobacco: Never Used  Substance Use Topics   Alcohol use: No   Drug use: No     Colonoscopy:  PAP:  Bone density:   Allergies  Allergen Reactions   Lisinopril Anaphylaxis and Swelling    Swelling of tongue and mouth 11/05/16- tolerates Olmesartan   Pepcid [Famotidine] Other (See Comments)    PPI H2, BLOCKERS LOWER  GASTRIC PH WHICH WOULD LEAD TO SUBTHERAPEUTIC RILPIVIRINE LEVELS AND POTENTIAL VIROLOGICAL FAILURE WITH RESISTANCE   Prilosec [Omeprazole] Other (See Comments)    PPI H2, BLOCKERS LOWER GASTRIC PH WHICH WOULD LEAD TO SUBTHERAPEUTIC RILPIVIRINE LEVELS AND POTENTIAL VIROLOGICAL FAILURE WITH RESISTANCE   Tums [Calcium Carbonate Antacid] Other (See Comments)    TUMS ANTACIDS CAN LOWER GASTRIC PH WHICH COULD  LEAD TO SUBTHERAPEUTIC RILPIVIRINE LEVELS AND POTENTIAL VIROLOGICAL FAILURE WITH RESISTANCE TUMS CAN BE GIVEN BUT NEED CONSULT WITH [facility] RE TIMING. I PREFER HER TO AVOID ALL TOGETHER    Current Outpatient Medications  Medication Sig Dispense Refill   CALCIUM CITRATE-VITAMIN D3 PO Take by mouth. 630 mg of calcium and 13  mcg d3, take once daily     Cholecalciferol (D3 ADULT PO) Take 25 mcg by mouth daily.     Coenzyme Q10 (COQ10) 100 MG CAPS Take by mouth daily.     dexamethasone (DECADRON) 4 MG tablet Take 1-2 tablets (4-8 mg total) by mouth 2 (two) times daily as needed. 40 tablet 6   ELDERBERRY PO Take 50 mg by mouth daily.     HYDROcodone-acetaminophen (NORCO/VICODIN) 5-325 MG tablet Take one tablet 4 times a day as instructed 120 tablet 0   hydrocortisone cream 1 % Apply 1 application topically daily as needed for itching.     JULUCA 50-25 MG TABS TAKE 1 TABLET BY MOUTH DAILY WITH BREAKFAST. TAKE WITH THE PREZCOBIX. 30 tablet 5   levothyroxine (SYNTHROID) 200 MCG tablet Take 1 tablet (200 mcg total) by mouth daily before breakfast. 90 tablet 1   lidocaine-prilocaine (EMLA) cream Apply 1 application topically as needed. Apply to port site 1 hour prior to access 30 g 0   Loratadine 10 MG CAPS Take by mouth as needed.     Menthol, Topical Analgesic, (BENGAY EX) Apply 1 application topically daily as needed (muscle pain).     methocarbamol (ROBAXIN) 500 MG tablet Take 1 tablet (500 mg total) by mouth every 8 (eight) hours as needed for muscle spasms. 90 tablet 1    Methylsulfonylmethane (MSM) 1000 MG TABS Take by mouth daily.     Naphazoline-Glycerin (REDNESS RELIEF OP) Place 1 drop into both eyes daily as needed (redness).     olmesartan-hydrochlorothiazide (BENICAR HCT) 40-25 MG tablet TAKE 1 TABLET BY MOUTH DAILY. 30 tablet 1   Omega-3 Fatty Acids (FISH OIL) 1000 MG CAPS Take by mouth daily.     potassium gluconate 595 (99 K) MG TABS tablet Take 595 mg by mouth daily.     PREZCOBIX 800-150 MG tablet TAKE 1 TABLET BY MOUTH DAILY. SWALLOW WHOLE. DO NOT CRUSH, BREAK OR CHEW TABLETS. TAKE WITH FOOD. 30 tablet 5   Probiotic Product (PROBIOTIC DAILY PO) Take by mouth daily.     prochlorperazine (COMPAZINE) 10 MG tablet Take 1 tablet (10 mg total) by mouth every 6 (six) hours as needed for nausea or vomiting. 30 tablet 4   SELZENTRY 150 MG tablet TAKE ONE TABLET BY MOUTH TWICE DAILY 60 tablet 5   Study - REPRIEVE [phone] - pitavastatin 4 mg or placebo tablet (PI-[place]) Take 1 tablet (4 mg total) by mouth daily. 30 tablet    tobramycin-dexamethasone (TOBRADEX) ophthalmic solution Place 2 drops into the right eye every 4 (four) hours while awake. 5 mL 0   Vitamin E 180 MG CAPS Take by mouth daily.     No current facility-administered medications for this visit.     OBJECTIVE:  Vitals:   01/23/19 1324  BP: 119/72  Pulse: 93  Resp: 18  Temp: 98.3 F (36.8 C)  SpO2: 97%     Body mass index is 37.4 kg/m.    Filed Weights   01/23/19 1324  Weight: 238 lb 12.8 oz (108.3 kg)  ECOG FS: 2  GENERAL: Patient is a well appearing female in no acute distress HEENT:  Erythema of the left eye sclera. Oropharynx clear and moist. No ulcerations or evidence of oropharyngeal candidiasis. Neck is supple.  NODES:  No cervical, supraclavicular, or axillary lymphadenopathy palpated.  BREAST EXAM:  Deferred. LUNGS:  Slightly diminished in right lower lobe, otherwise clear throughout.  No wheezes or rhonchi. HEART:  Regular rate and rhythm. No  murmur  appreciated. ABDOMEN:  Soft, nontender. normoactive bowel sounds. No organomegaly palpated. MSK:  No focal spinal tenderness to palpation. Full range of motion bilaterally in the upper extremities. EXTREMITIES:  No peripheral edema.   SKIN:  Clear with no obvious rashes or skin changes. No nail dyscrasia. NEURO:  Nonfocal. Well oriented.  Appropriate affect.  LAB RESULTS:  CMP     Component Value Date/Time   NA 140 01/09/2019 1226   NA 140 05/13/2017 0927   K 3.7 01/09/2019 1226   K 4.1 05/13/2017 0927   CL 104 01/09/2019 1226   CO2 26 01/09/2019 1226   CO2 29 05/13/2017 0927   GLUCOSE 95 01/09/2019 1226   GLUCOSE 102 05/13/2017 0927   BUN 11 01/09/2019 1226   BUN 17.7 05/13/2017 0927   CREATININE 1.36 (H) 01/09/2019 1226   CREATININE 1.44 (H) 01/02/2019 1241   CREATININE 1.62 (H) 09/18/2018 0911   CREATININE 1.4 (H) 05/13/2017 0927   CALCIUM 9.6 01/09/2019 1226   CALCIUM 10.5 (H) 05/13/2017 0927   PROT 8.2 (H) 01/09/2019 1226   PROT 8.1 05/13/2017 0927   ALBUMIN 3.6 01/09/2019 1226   ALBUMIN 3.5 05/13/2017 0927   AST 27 01/09/2019 1226   AST 24 01/02/2019 1241   AST 14 05/13/2017 0927   ALT 15 01/09/2019 1226   ALT 18 01/02/2019 1241   ALT 12 05/13/2017 0927   ALKPHOS 75 01/09/2019 1226   ALKPHOS 90 05/13/2017 0927   BILITOT 0.3 01/09/2019 1226   BILITOT 0.3 01/02/2019 1241   BILITOT 0.27 05/13/2017 0927   GFRNONAA 41 (L) 01/09/2019 1226   GFRNONAA 39 (L) 01/02/2019 1241   GFRNONAA 34 (L) 09/18/2018 0911   GFRAA 48 (L) 01/09/2019 1226   GFRAA 45 (L) 01/02/2019 1241   GFRAA 39 (L) 09/18/2018 0911    No results found for: Ronnald Ramp, A1GS, A2GS, BETS, BETA2SER, GAMS, MSPIKE, SPEI  No results found for: Nils Pyle, [facility]  Lab Results  Component Value Date   WBC 9.9 01/23/2019   NEUTROABS 6.2 01/23/2019   HGB 12.7 01/23/2019   HCT 38.9 01/23/2019   MCV 95.3 01/23/2019   PLT 265 01/23/2019      Chemistry      Component  Value Date/Time   NA 140 01/09/2019 1226   NA 140 05/13/2017 0927   K 3.7 01/09/2019 1226   K 4.1 05/13/2017 0927   CL 104 01/09/2019 1226   CO2 26 01/09/2019 1226   CO2 29 05/13/2017 0927   BUN 11 01/09/2019 1226   BUN 17.7 05/13/2017 0927   CREATININE 1.36 (H) 01/09/2019 1226   CREATININE 1.44 (H) 01/02/2019 1241   CREATININE 1.62 (H) 09/18/2018 0911   CREATININE 1.4 (H) 05/13/2017 0927   GLU 104 02/13/2016 1358      Component Value Date/Time   CALCIUM 9.6 01/09/2019 1226   CALCIUM 10.5 (H) 05/13/2017 0927   ALKPHOS 75 01/09/2019 1226   ALKPHOS 90 05/13/2017 0927   AST 27 01/09/2019 1226   AST 24 01/02/2019 1241   AST 14 05/13/2017 0927   ALT 15 01/09/2019 1226   ALT 18 01/02/2019 1241   ALT 12 05/13/2017 0927   BILITOT 0.3 01/09/2019 1226   BILITOT 0.3 01/02/2019 1241   BILITOT 0.27 05/13/2017 0927       Lab Results  Component Value Date   LABCA2 11 04/20/2008    No components found for: [id number]  No results for input(s): INR in the last 168 hours.  Urinalysis  Component Value Date/Time   COLORURINE YELLOW 05/30/2018 1123   APPEARANCEUR CLEAR 05/30/2018 1123   LABSPEC 1.017 05/30/2018 1123   PHURINE 6.0 05/30/2018 1123   GLUCOSEU NEGATIVE 05/30/2018 1123   GLUCOSEU NEG mg/dL 07/23/2006 2113   HGBUR NEGATIVE 05/30/2018 1123   BILIRUBINUR NEGATIVE 05/30/2018 1123   KETONESUR NEGATIVE 05/30/2018 [place] 05/30/2018 1123   UROBILINOGEN 1 07/23/2006 2113   NITRITE NEGATIVE 05/30/2018 1123   LEUKOCYTESUR NEGATIVE 05/30/2018 1123     STUDIES: Mm Diag Breast Tomo Uni Left  Result Date: 12/25/2018 CLINICAL DATA:  Screening recall for possible left breast mass. History of metastatic right breast cancer. EXAM: DIGITAL DIAGNOSTIC UNILATERAL LEFT MAMMOGRAM WITH CAD AND TOMO COMPARISON:  Previous exam(s). ACR Breast Density Category b: There are scattered areas of fibroglandular density. FINDINGS: Tomograms were performed of the left breast. No  suspicious masses or abnormality seen, with stable mammographic appearance of the left breast dating back to at least 2012. There is no mammographic evidence of malignancy. Mammographic images were processed with CAD. IMPRESSION: No mammographic evidence of malignancy in the left breast. RECOMMENDATION: Screening mammography of the left breast in 1 year. I have discussed the findings and recommendations with the patient. Results were also provided in writing at the conclusion of the visit. If applicable, a reminder letter will be sent to the patient regarding the next appointment. BI-RADS CATEGORY  1: Negative. Electronically Signed   By: Everlean Alstrom M.D.   On: 12/25/2018 16:07      ELIGIBLE FOR AVAILABLE RESEARCH PROTOCOL: no   ASSESSMENT: 63 y.o. [place] woman   (1) status post right lumpectomy and sentinel lymph node sampling October 2005 for a 0.6 cm invasive ductal carcinoma involving one out of 2 sentinel lymph nodes sampled, grade 3, triple-negative, treated adjuvantly with doxorubicin and cyclophosphamide 4 followed by weekly paclitaxel 7, followed by adjuvant radiation  RECURRENT DISEASE: (2) status post right breast upper outer quadrant biopsy and right axillary lymph node biopsy 10/01/2016, both positive for a T2 N1, stage IIIB invasive ductal carcinoma, triple negative, with an MIB-1 of 50-70%  (3) status post right modified radical mastectomy 11/05/2016 showing a pT2 pN1, stage IIIB invasive ductal carcinoma, grade 3, triple negative, with negative margins  (4) not a candidate for radiation given prior history  (5) adjuvant chemotherapy consisting of carboplatin and gemcitabine given days 1 and 8 of each 21 day cycle, for 6 cycles, starting 11/20/2016, completed 03/11/2017  (a) day 8 cycle 2 omitted because of neutropenia; Neupogen/Neulasta added  (5) HIV positivity: under care of Id Lucianne [place])  (6) Genetic testing 06/17/2017:  no pathogenic mutations. Genes tested: APC,  ATM, AXIN2, BARD1, BLM, BMPR1A, BRCA1, BRCA2, BRIP1, CDH1, CDK4, CDKN2A (p14ARF), CDKN2A (p16INK4a), CEBPA, CHEK2, CTNNA1, DICER1, EPCAM*, GATA2, GREM1*, HRAS, KIT, MEN1, MLH1, MSH2, MSH3, MSH6, MUTYH, NBN, NF1, PALB2, PDGFRA, PMS2, POLD1, POLE, PTEN, RAD50, RAD51C, RAD51D, RUNX1, SDHB, SDHC, SDHD, SMAD4, SMARCA4, STK11, TERC, TERT, TP53, TSC1, TSC2, VHL. The following genes were evaluated for sequence changes only: HOXB13*, NTHL1*, SDHA.   (a) A variant of uncertain significance (VUS) in a gene called NTHL1 was also noted. c.736G>A (p.Ala246Thr)  METASTATIC DISEASE: November 2019 (1) Patient seen in urgent care and ultimately ED on 10/21 for shortness of breath, chest xray demonstrated Right pleural effusion.   (a) right thoracenteses on 10/21 and 11/4 results show atypical cells, non diagnostic (b) CT chest 04/22/2018 shows re-accumulation of fluid, and right pleural nodularity. (c) PET scan on 05/01/2018 shows  hypermetabolic pleural based metastases, right CP angle nodal metastases, no evidence of malignancy in abdomen and pelvis. (d) bronchoscopy with biopsy by Dr. Lamonte Sakai and BAL on 05/15/2018 was non diagnostic  (e) VAT biopsy of the right pleura 05/30/2018 confirms carcinoma, again triple negative  (f) [facility] shows PD-L1 positive (1% in [facility]); otherwise microsatellite stable, TMB low (5/Mb), no PIK3 mutations; other mutations suggest sensitivity to MTOR inhibitors and several TKIs  (2) Atezolizumab started 07/15/2018 given every other week  (a) PET 08/13/2018 shows tumor Right hemithorax (new baseline study)  (b) Atezo changed to Q4w starting with 11/07/2018 dose  (c) PET 11/28/2018 documents progression in the right lung and pleural area as well as lymph nodes, and a T1 skeletal metastasis  (d) atezolizumab discontinued after 12/05/2018 dose  (3) eribulin day 1 and 8 of every 21 day cycle started 12/19/2018  (a) changed to every other week with Neulsta support due to neutropenia  causing treatment delays  PLAN:  Sophiah is doing well today. She has no clinical signs of breast cancer progression. Labs were reviewed with the patient. Onpro was recently added to prevent neutropenia and delays in treatment, which occurred with prior cycles of treatment. Her WBCs were within normal limits today. She will receive Day 15 of Cycle 2 today as scheduled. The plan is to restage her disease after 6 doses of eribulin, or sooner if she develops concerning symptoms for progression.   I reviewed constipation management with the patient and have provided her a handout of recommendations for constipation management. We discussed that she may take a stool softener such as Colace or Senna one tablet twice a day everyday to avoid constipation. These medications are available over the counter.  Of course, if she should develop diarrhea, she should stop taking stool softeners.  She also was encouraged to drinking plenty of fluid, eating fruits and vegetables, and being active to reduce the risk of constipation. If despite taking stool softeners, and still have not achieved a bowel movement for 2 days or more than normal bowel habit frequency, she was advised that she may take one of the following over the counter laxatives: MiraLax, Milk of Magnesia or Mag Citrate everyday. The goal is to have at least one bowel movement every other day.   Her sclera erythema is improving but I discussed concerning visual symptoms which warrant further evaluation such as visual changes, eye pain, swelling, or drainage. She expressed understanding.   For symptomatic management of her prior complaints, she will continue using a humidifier for her cough, use loratadine for arthralgias secondary to Onpro, use her hydrocodone and muscle relaxer for cancer related pain/insomnia, and use meal replacement in the mornings.   Luverna will return in 2 weeks for labs, f/u, and Eribulin.  She was recommended to continue with the  appropriate pandemic precautions. A total of (20) minutes of face-to-face time was spent with this patient with greater than 50% of that time in counseling and care-coordination.  The patient was advised to call immediately if she has any concerning symptoms in the interval. The patient voices understanding of current disease status and treatment options and is in agreement with the current care plan. All questions were answered. The patient knows to call the clinic with any problems, questions or concerns. We can certainly see the patient much sooner if necessary  Cassandra Heilingoetter PA-C  01/23/19 2:02 PM Medical Oncology and Hematology [facility] [address] [place], [place] [address] Tel. [phone]  Fax. [phone]

## 2019-01-23 NOTE — Patient Instructions (Signed)
It is common for patients who are undergoing treatment and taking certain prescribed medications to experience side-effects with constipation.  If you experience constipation, please take stool softener such as Colace or Senna one tablet twice a day everyday to avoid constipation.  These medications are available over the counter.  Of course, if you have diarrhea, stop taking stool softeners.  Drinking plenty of fluid, eating fruits and vegetable, and being active also reduces the risk of constipation.   If despite taking stool softeners, and you still have no bowel movement for 2 days or more than your normal bowel habit frequency, please take one of the following over the counter laxatives:  MiraLax, Milk of Magnesia or Mag Citrate everyday and contact me immediately for further instructions.  The goal is to have at least one bowel movement every other day.    Constipation, Adult Constipation is when a person has fewer bowel movements in a week than normal, has difficulty having a bowel movement, or has stools that are dry, hard, or larger than normal. Constipation may be caused by an underlying condition. It may become worse with age if a person takes certain medicines and does not take in enough fluids. Follow these instructions at home: Eating and drinking   Eat foods that have a lot of fiber, such as fresh fruits and vegetables, whole grains, and beans.  Limit foods that are high in fat, low in fiber, or overly processed, such as french fries, hamburgers, cookies, candies, and soda.  Drink enough fluid to keep your urine clear or pale yellow. General instructions  Exercise regularly or as told by your health care provider.  Go to the restroom when you have the urge to go. Do not hold it in.  Take over-the-counter and prescription medicines only as told by your health care provider. These include any fiber supplements.  Practice pelvic floor retraining exercises, such as deep breathing  while relaxing the lower abdomen and pelvic floor relaxation during bowel movements.  Watch your condition for any changes.  Keep all follow-up visits as told by your health care provider. This is important. Contact a health care provider if:  You have pain that gets worse.  You have a fever.  You do not have a bowel movement after 4 days.  You vomit.  You are not hungry.  You lose weight.  You are bleeding from the anus.  You have thin, pencil-like stools. Get help right away if:  You have a fever and your symptoms suddenly get worse.  You leak stool or have blood in your stool.  Your abdomen is bloated.  You have severe pain in your abdomen.  You feel dizzy or you faint. This information is not intended to replace advice given to you by your health care provider. Make sure you discuss any questions you have with your health care provider. Document Released: 03/09/2004 Document Revised: 05/24/2017 Document Reviewed: 11/30/2015 Elsevier Patient Education  2020 [employer].

## 2019-01-24 LAB — CANCER ANTIGEN 27.29: CA 27.29: 42 U/mL — ABNORMAL HIGH (ref 0.0–38.6)

## 2019-01-26 MED FILL — PREZCOBIX 800 MG-150 MG TAB: 800-150 | 30 days supply | Qty: 30 | Fill #3

## 2019-01-26 MED FILL — JULUCA 50-25 MG TAB: 50-25 | 30 days supply | Qty: 30 | Fill #3

## 2019-02-04 DIAGNOSIS — H04123 Dry eye syndrome of bilateral lacrimal glands: Secondary | ICD-10-CM | POA: Diagnosis not present

## 2019-02-05 ENCOUNTER — Other Ambulatory Visit: Payer: Self-pay | Admitting: Oncology

## 2019-02-05 DIAGNOSIS — M1990 Unspecified osteoarthritis, unspecified site: Secondary | ICD-10-CM

## 2019-02-05 MED FILL — SELZENTRY 150 MG TABS: 150 | 30 days supply | Qty: 60 | Fill #1

## 2019-02-05 MED FILL — OLMESARTAN-HCTZ 40-25 MG TA: 40-25 | 30 days supply | Qty: 30 | Fill #1

## 2019-02-06 ENCOUNTER — Inpatient Hospital Stay: Payer: Medicare HMO

## 2019-02-06 ENCOUNTER — Other Ambulatory Visit: Payer: Self-pay

## 2019-02-06 ENCOUNTER — Inpatient Hospital Stay: Payer: Medicare HMO | Attending: Adult Health

## 2019-02-06 ENCOUNTER — Inpatient Hospital Stay (HOSPITAL_BASED_OUTPATIENT_CLINIC_OR_DEPARTMENT_OTHER): Payer: Medicare HMO | Admitting: Physician Assistant

## 2019-02-06 VITALS — BP 118/75 | HR 99 | Temp 98.9°F | Resp 18 | Ht 67.0 in | Wt 236.9 lb

## 2019-02-06 DIAGNOSIS — Z171 Estrogen receptor negative status [ER-]: Secondary | ICD-10-CM

## 2019-02-06 DIAGNOSIS — Z79899 Other long term (current) drug therapy: Secondary | ICD-10-CM | POA: Insufficient documentation

## 2019-02-06 DIAGNOSIS — C782 Secondary malignant neoplasm of pleura: Secondary | ICD-10-CM | POA: Diagnosis not present

## 2019-02-06 DIAGNOSIS — C50411 Malignant neoplasm of upper-outer quadrant of right female breast: Secondary | ICD-10-CM

## 2019-02-06 DIAGNOSIS — Z95828 Presence of other vascular implants and grafts: Secondary | ICD-10-CM

## 2019-02-06 DIAGNOSIS — C7801 Secondary malignant neoplasm of right lung: Secondary | ICD-10-CM | POA: Diagnosis not present

## 2019-02-06 DIAGNOSIS — Z5111 Encounter for antineoplastic chemotherapy: Secondary | ICD-10-CM

## 2019-02-06 DIAGNOSIS — C50911 Malignant neoplasm of unspecified site of right female breast: Secondary | ICD-10-CM

## 2019-02-06 LAB — COMPREHENSIVE METABOLIC PANEL
ALT: 13 U/L (ref 0–44)
AST: 23 U/L (ref 15–41)
Albumin: 3.7 g/dL (ref 3.5–5.0)
Alkaline Phosphatase: 70 U/L (ref 38–126)
Anion gap: 10 (ref 5–15)
BUN: 15 mg/dL (ref 8–23)
CO2: 27 mmol/L (ref 22–32)
Calcium: 10.5 mg/dL — ABNORMAL HIGH (ref 8.9–10.3)
Chloride: 102 mmol/L (ref 98–111)
Creatinine, Ser: 1.48 mg/dL — ABNORMAL HIGH (ref 0.44–1.00)
GFR calc Af Amer: 43 mL/min — ABNORMAL LOW (ref >60)
GFR calc non Af Amer: 37 mL/min — ABNORMAL LOW (ref >60)
Glucose, Bld: 85 mg/dL (ref 70–99)
Potassium: 3.7 mmol/L (ref 3.5–5.1)
Sodium: 139 mmol/L (ref 135–145)
Total Bilirubin: 0.4 mg/dL (ref 0.3–1.2)
Total Protein: 8.2 g/dL — ABNORMAL HIGH (ref 6.5–8.1)

## 2019-02-06 LAB — CBC WITH DIFFERENTIAL/PLATELET
Abs Immature Granulocytes: 0.07 10*3/uL (ref 0.00–0.07)
Basophils Absolute: 0.1 10*3/uL (ref 0.0–0.1)
Basophils Relative: 1 %
Eosinophils Absolute: 0.3 10*3/uL (ref 0.0–0.5)
Eosinophils Relative: 3 %
HCT: 38.6 % (ref 36.0–46.0)
Hemoglobin: 12.6 g/dL (ref 12.0–15.0)
Immature Granulocytes: 1 %
Lymphocytes Relative: 18 %
Lymphs Abs: 2 10*3/uL (ref 0.7–4.0)
MCH: 31.1 pg (ref 26.0–34.0)
MCHC: 32.6 g/dL (ref 30.0–36.0)
MCV: 95.3 fL (ref 80.0–100.0)
Monocytes Absolute: 1.2 10*3/uL — ABNORMAL HIGH (ref 0.1–1.0)
Monocytes Relative: 11 %
Neutro Abs: 7.4 10*3/uL (ref 1.7–7.7)
Neutrophils Relative %: 66 %
Platelets: 257 10*3/uL (ref 150–400)
RBC: 4.05 MIL/uL (ref 3.87–5.11)
RDW: 14.8 % (ref 11.5–15.5)
WBC: 11 10*3/uL — ABNORMAL HIGH (ref 4.0–10.5)
nRBC: 0 % (ref 0.0–0.2)

## 2019-02-06 MED ORDER — PROCHLORPERAZINE MALEATE 10 MG PO TABS
10.0000 mg | ORAL_TABLET | Freq: Once | ORAL | Status: AC
  Administered 2019-02-06: 10 mg via ORAL

## 2019-02-06 MED ORDER — SODIUM CHLORIDE 0.9 % IV SOLN
Freq: Once | INTRAVENOUS | Status: AC
  Administered 2019-02-06: 14:00:00 via INTRAVENOUS
  Filled 2019-02-06: qty 250

## 2019-02-06 MED ORDER — PEGFILGRASTIM 6 MG/0.6ML ~~LOC~~ PSKT
6.0000 mg | PREFILLED_SYRINGE | Freq: Once | SUBCUTANEOUS | Status: AC
  Administered 2019-02-06: 6 mg via SUBCUTANEOUS

## 2019-02-06 MED ORDER — SODIUM CHLORIDE 0.9% FLUSH
10.0000 mL | INTRAVENOUS | Status: DC | PRN
  Administered 2019-02-06: 10 mL
  Filled 2019-02-06: qty 10

## 2019-02-06 MED ORDER — SODIUM CHLORIDE 0.9% FLUSH
10.0000 mL | Freq: Once | INTRAVENOUS | Status: AC
  Administered 2019-02-06: 10 mL
  Filled 2019-02-06: qty 10

## 2019-02-06 MED ORDER — HEPARIN SOD (PORK) LOCK FLUSH 100 UNIT/ML IV SOLN
500.0000 [IU] | Freq: Once | INTRAVENOUS | Status: AC | PRN
  Administered 2019-02-06: 500 [IU]
  Filled 2019-02-06: qty 5

## 2019-02-06 MED ORDER — SODIUM CHLORIDE 0.9 % IV SOLN
1.2000 mg/m2 | Freq: Once | INTRAVENOUS | Status: AC
  Administered 2019-02-06: 2.75 mg via INTRAVENOUS
  Filled 2019-02-06: qty 5.5

## 2019-02-06 MED ORDER — PROCHLORPERAZINE MALEATE 10 MG PO TABS
ORAL_TABLET | ORAL | Status: AC
  Filled 2019-02-06: qty 1

## 2019-02-06 MED ORDER — PEGFILGRASTIM 6 MG/0.6ML ~~LOC~~ PSKT
PREFILLED_SYRINGE | SUBCUTANEOUS | Status: AC
  Filled 2019-02-06: qty 0.6

## 2019-02-06 MED FILL — HYDROCODON-APAP 5-325: 5-325 | 30 days supply | Qty: 120 | Fill #0

## 2019-02-06 NOTE — Progress Notes (Signed)
[facility]  Telephone:(336) [phone] Fax:([phone]) [phone]    ID: ALAIJAH JINDAL DOB: [birth year]  MR#: [id number]  AOZ#:[id number]  Patient Care Team: Sharon Seller, NP as PCP - General (Nurse Practitioner) Daiva Eves, Lisette Grinder, MD as PCP - Infectious Diseases (Infectious Diseases) Ernesto Rutherford, MD as Consulting Physician (Ophthalmology) Romero Belling, MD as Consulting Physician (Endocrinology) Gean Birchwood, MD as Consulting Physician (Orthopedic Surgery) Magrinat, Valentino Hue, MD as Consulting Physician (Oncology) Claud Kelp, MD as Consulting Physician (General Surgery) Reva Bores, MD as Consulting Physician (Obstetrics and Gynecology) Axel Filler, Larna Daughters, NP as Nurse Practitioner (Hematology and Oncology) Glenford Peers, OD as Referring Physician (Optometry) OTHER MD:   CHIEF COMPLAINT: Triple negative breast cancer, recurrent  CURRENT TREATMENT:  eribulin  INTERVAL HISTORY: Quilla returns today for follow-up and treatment of her recurrent triple negative breast cancer.  She had been on atezolizumab. She tolerated that well but there was evidence of disease progression. She is receiving Eribulin.   She will now receive Eribulin every 2 weeks with Onpro. She is here for Day 1 of Cycle 3.   REVIEW OF SYSTEMS: Sindhuja is doing well today. She previously experienced insomnia secondary to bone pain. Her pain regimen was modified to include a muscle relaxer before bed as well as Hydrocodone 2-3 times per day. This has significantly improved her pain as well as her ability to sleep. She also uses loradaine for bone pain secondary to Onpro. She also previously had experienced a dry cough. She recently installed a humidifier in her room with has substantially improved her symptoms. She denies any more cough.   Her only concerns today are related to some new onset muscle soreness from sitting for 7-8 hours per day. The last few weeks, she has  been confined to being in front of a computer screen for several hours to train for her job. She believes that sitting for long durations has caused mild soreness/tightness to her right neck/shoulder and back. 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   She also recently had an appointment with her eye doctor for evaluation of new onset "haziness" from her left eye. After evaluation, it was determined that she did not have any concerning abnormalities except for dry eyes. He prescribed her an eyedrop which has improved her symptoms.   Otherwise she is feeling well. She denies any fever, chills, night sweats, or weight loss. She denies any chest pain, shortness of breath, cough, or hemoptysis. She denies any nausea, vomiting, diarrhea, or constipation. She denies any headaches. She has no new rashes or skin changes. A detailed ROS was otherwise non-contributory.   BREAST CANCER HISTORY: From the original intake note:  Leosha is a history of right-sided breast cancer dating back to 2005. She had a lumpectomy with sentinel lymph node sampling, chemotherapy, and radiation. I do not have access to those records at present.  More recently [facility] showing a possible mass in the right breast. Diagnostic mammography with ultrasonography on 09/28/2016 the patient underwent right diagnostic mammography with tomography and right breast ultrasonography. The breast density was category A. In the right breast at the 10:00 position there was an irregular mass measuring 2.5 cm. Ultrasound identified this the 10:00 radiant 10 cm from the nipple measuring 2.4 cm. In the right axilla there was an abnormal lymph node  measuring 1.3 cm with other normal-appearing lymph nodes.  On 10/01/2016 she  underwent biopsy of the right breast mass in  question as well as the suspicious axillary lymph node. 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 The proliferation marker was 70% in the breast lesion and 50% in the lymph node.  Her subsequent history is as detailed below.   MEDICAL HISTORY: Past Medical History:  Diagnosis Date   Alopecia areata 11/28/2009   Bell's palsy    Cancer (HCC) 2005   Breast cancer   chemotherapy and radiation   CKD (chronic kidney disease) stage 3, GFR 30-59 ml/min (HCC) 06/08/2015   Dry eye syndrome    Family history of lung cancer    Family history of non-Hodgkin's lymphoma    Fasting hyperglycemia    Gestational diabetes    2001   HIP FRACTURE, RIGHT 05/06/2008   History of kidney stones    HIV DISEASE 03/27/2006   HYPERLIPIDEMIA, MIXED 12/15/2007   HYPERTENSION 03/27/2006   HYPOTHYROIDISM, POST-RADIATION 06/28/2008   MENORRHAGIA, POSTMENOPAUSAL 02/03/2009   Osteoarthritis of left knee 06/08/2015   OSTEOARTHROSIS, LOCAL, SCND, UNSPC SITE 04/07/2007   Personal history of chemotherapy 11/2018   PVD 04/07/2007   Tinea capitis    TRIGGER FINGER 05/06/2008   Unspecified vitamin D deficiency 08/06/2007    ALLERGIES:  is allergic to lisinopril; pepcid [famotidine]; prilosec [omeprazole]; and tums [calcium carbonate antacid].  MEDICATIONS:  Current Outpatient Medications  Medication Sig Dispense Refill   CALCIUM CITRATE-VITAMIN D3 PO Take by mouth. 630 mg of calcium and 13 mcg d3, take once daily     Cholecalciferol (D3 ADULT PO) Take 25 mcg by mouth daily.     Coenzyme Q10 (COQ10) 100 MG CAPS Take by mouth daily.     dexamethasone (DECADRON) 4 MG tablet Take 1-2 tablets (4-8 mg total) by mouth 2 (two) times daily as needed. 40 tablet 6   ELDERBERRY PO Take 50 mg by mouth daily.     HYDROcodone-acetaminophen (NORCO/VICODIN) 5-325  MG tablet TAKE ONE TABLET 4 TIMES A DAY AS INSTRUCTED 120 tablet 0   hydrocortisone cream 1 % Apply 1 application topically daily as needed for itching.     JULUCA 50-25 MG TABS TAKE 1 TABLET BY MOUTH DAILY WITH BREAKFAST. TAKE WITH THE PREZCOBIX. 30 tablet 5   levothyroxine (SYNTHROID) 200 MCG tablet Take 1 tablet (200 mcg total) by mouth daily before breakfast. 90 tablet 1   lidocaine-prilocaine (EMLA) cream Apply 1 application topically as needed. Apply to port site 1 hour prior to access 30 g 0   Loratadine 10 MG CAPS Take by mouth as needed.     Menthol, Topical Analgesic, (BENGAY EX) Apply 1 application topically daily as needed (muscle pain).     methocarbamol (ROBAXIN) 500 MG tablet Take 1 tablet (500 mg total) by mouth every 8 (eight) hours as needed for muscle spasms. 90 tablet 1   Methylsulfonylmethane (MSM) 1000 MG TABS Take by mouth daily.     Naphazoline-Glycerin (REDNESS RELIEF OP) Place 1 drop into both eyes daily as needed (redness).     olmesartan-hydrochlorothiazide (BENICAR HCT) 40-25 MG tablet TAKE 1 TABLET BY MOUTH DAILY. 30 tablet 1   Omega-3 Fatty Acids (FISH OIL) 1000 MG CAPS Take by mouth daily.     potassium gluconate 595 (99 K) MG TABS tablet Take 595 mg by mouth daily.  PREZCOBIX 800-150 MG tablet TAKE 1 TABLET BY MOUTH DAILY. SWALLOW WHOLE. DO NOT CRUSH, BREAK OR CHEW TABLETS. TAKE WITH FOOD. 30 tablet 5   Probiotic Product (PROBIOTIC DAILY PO) Take by mouth daily.     prochlorperazine (COMPAZINE) 10 MG tablet Take 1 tablet (10 mg total) by mouth every 6 (six) hours as needed for nausea or vomiting. 30 tablet 4   SELZENTRY 150 MG tablet TAKE ONE TABLET BY MOUTH TWICE DAILY 60 tablet 5   Study - REPRIEVE [phone] - pitavastatin 4 mg or placebo tablet ([place]) Take 1 tablet (4 mg total) by mouth daily. 30 tablet    tobramycin-dexamethasone (TOBRADEX) ophthalmic solution Place 2 drops into the right eye every 4 (four) hours while awake. 5 mL 0    Vitamin E 180 MG CAPS Take by mouth daily.     No current facility-administered medications for this visit.    Facility-Administered Medications Ordered in Other Visits  Medication Dose Route Frequency Provider Last Rate Last Dose   sodium chloride flush (NS) 0.9 % injection 10 mL  10 mL Intracatheter PRN Magrinat, Valentino Hue, MD   10 mL at 02/06/19 1511    SURGICAL HISTORY:  Past Surgical History:  Procedure Laterality Date   BREAST LUMPECTOMY Right    2005   CHEST TUBE INSERTION Right 05/30/2018   Procedure: INSERTION PLEURAL DRAINAGE CATHETER;  Surgeon: Delight Ovens, MD;  Location: [facility] OR;  Service: Thoracic;  Laterality: Right;   COLONOSCOPY     COLONOSCOPY WITH ESOPHAGOGASTRODUODENOSCOPY (EGD)     ENDOBRONCHIAL ULTRASOUND Bilateral 05/15/2018   Procedure: ENDOBRONCHIAL ULTRASOUND;  Surgeon: Leslye Peer, MD;  Location: [facility]COPY;  Service: Cardiopulmonary;  Laterality: Bilateral;   FINE NEEDLE ASPIRATION BIOPSY  05/15/2018   Procedure: FINE NEEDLE ASPIRATION BIOPSY;  Surgeon: Leslye Peer, MD;  Location: [facility];  Service: Cardiopulmonary;;   FLEXIBLE BRONCHOSCOPY  05/15/2018   Procedure: FLEXIBLE BRONCHOSCOPY;  Surgeon: Leslye Peer, MD;  Location: [facility];  Service: Cardiopulmonary;;   HYSTEROSCOPY  2006   IR IMAGING GUIDED PORT INSERTION  07/14/2018   IR THORACENTESIS ASP PLEURAL SPACE W/IMG GUIDE  04/14/2018   IR THORACENTESIS ASP PLEURAL SPACE W/IMG GUIDE  04/28/2018   KNEE ARTHROSCOPY Left 2002   LYMPH NODE DISSECTION  2005   MASTECTOMY Right    MASTECTOMY MODIFIED RADICAL Right 11/05/2016   Procedure: RIGHT MASTECTOMY MODIFIED RADICAL;  Surgeon: Claud Kelp, MD;  Location: [facility] OR;  Service: General;  Laterality: Right;   MODIFIED RADICAL MASTECTOMY Right 11/05/2016   placement   of port-a-cath     PLEURAL BIOPSY Right 05/30/2018   Procedure: PLEURAL BIOPSY;  Surgeon: Delight Ovens, MD;  Location[facility] OR;  Service: Thoracic;   Laterality: Right;   PLEURAL EFFUSION DRAINAGE Right 05/30/2018   Procedure: DRAINAGE OF PLEURAL EFFUSION;  Surgeon: Delight Ovens, MD;  Location: [facility] OR;  Service: Thoracic;  Laterality: Right;   PORT-A-CATH REMOVAL  2006   insertion 2005   PORT-A-CATH REMOVAL N/A 03/29/2017   Procedure: REMOVAL PORT-A-CATH;  Surgeon: Claud Kelp, MD;  Location: [facility] OR;  Service: General;  Laterality: N/A;   PORTACATH PLACEMENT Right 11/05/2016   Procedure: INSERTION PORT-A-CATH WITH ULTRA SOUND;  Surgeon: Claud Kelp, MD;  Location: [facility] OR;  Service: General;  Laterality: Right;   removal of port a cath  12/2014   TALC PLEURODESIS Right 05/30/2018   Procedure: Lurlean Nanny;  Surgeon: Delight Ovens, MD;  Location: [facility] OR;  Service: Thoracic;  Laterality: Right;   THYROID SURGERY  2009   Ablation    TOTAL KNEE ARTHROPLASTY Left 02/06/2016   Procedure: TOTAL KNEE ARTHROPLASTY;  Surgeon: Gean Birchwood, MD;  Location: [facility] OR;  Service: Orthopedics;  Laterality: Left;   TUBAL LIGATION     VIDEO ASSISTED THORACOSCOPY Right 05/30/2018   Procedure: VIDEO ASSISTED THORACOSCOPY;  Surgeon: Delight Ovens, MD;  Location: [facility] OR;  Service: Thoracic;  Laterality: Right;    GYNECOLOGIC HISTORY:  Patient's last menstrual period was 11/06/2003.  menarche age 34, first live birth age 80, the patient is GX P2. She stopped having periods in 2005, with her chemotherapy; she never took hormone replacement    SOCIAL HISTORY:  Rain worked for the [employer] more than 20 years. 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 At home she lives with her daughter Freda Munro who works for [employer] and her granddaughter [name], 72 y/o as of APRIL 2018.  The patient's son also drops infrequently.  They are all careful regarding pandemic precautions                          ADVANCED DIRECTIVES:  not in place   PHYSICAL EXAMINATION:  Blood pressure 118/75, pulse 99,  temperature 98.9 F (37.2 C), temperature source Temporal, resp. rate 18, height 5\' 7"  (1.702 m), weight 236 lb 14.4 oz (107.5 kg), last menstrual period 11/06/2003, SpO2 97 %.  ECOG PERFORMANCE STATUS: 1 - Symptomatic but completely ambulatory  GENERAL: Patient is a well appearing female in no acute distress HEENT:  Oropharynx clear and moist. No ulcerations or evidence of oropharyngeal candidiasis. Neck is supple.  NODES:  No cervical, supraclavicular, or axillary lymphadenopathy palpated.  BREAST EXAM:  Deferred. LUNGS:  Slightly diminished in right lower lobe, otherwise clear throughout.  No wheezes or rhonchi. HEART:  Regular rate and rhythm. No murmur appreciated. ABDOMEN:  Soft, nontender. normoactive bowel sounds. No organomegaly palpated. MSK:  No focal spinal tenderness to palpation. Full range of motion bilaterally in the upper extremities. EXTREMITIES:  No peripheral edema.   SKIN:  Clear with no obvious rashes or skin changes. No nail dyscrasia. NEURO:  Nonfocal. Well oriented.  Appropriate affect.  LABORATORY DATA: Lab Results  Component Value Date   WBC 11.0 (H) 02/06/2019   HGB 12.6 02/06/2019   HCT 38.6 02/06/2019   MCV 95.3 02/06/2019   PLT 257 02/06/2019      Chemistry      Component Value Date/Time   NA 139 02/06/2019 1300   NA 140 05/13/2017 0927   K 3.7 02/06/2019 1300   K 4.1 05/13/2017 0927   CL 102 02/06/2019 1300   CO2 27 02/06/2019 1300   CO2 29 05/13/2017 0927   BUN 15 02/06/2019 1300   BUN 17.7 05/13/2017 0927   CREATININE 1.48 (H) 02/06/2019 1300   CREATININE 1.50 (H) 01/23/2019 1316   CREATININE 1.62 (H) 09/18/2018 0911   CREATININE 1.4 (H) 05/13/2017 0927   GLU 104 02/13/2016 1358      Component Value Date/Time   CALCIUM 10.5 (H) 02/06/2019 1300   CALCIUM 10.5 (H) 05/13/2017 0927   ALKPHOS 70 02/06/2019 1300   ALKPHOS 90 05/13/2017 0927   AST 23 02/06/2019 1300   AST 25 01/23/2019 1316   AST 14 05/13/2017 0927   ALT 13 02/06/2019  1300   ALT 18 01/23/2019 1316   ALT 12 05/13/2017 0927   BILITOT 0.4  02/06/2019 1300   BILITOT 0.4 01/23/2019 1316   BILITOT 0.27 05/13/2017 0927       RADIOGRAPHIC STUDIES:  No results found.   ASSESSMENT: 63 y.o. [place] woman   (1) status post right lumpectomy and sentinel lymph node sampling October 2005 for a 0.6 cm invasive ductal carcinoma involving one out of 2 sentinel lymph nodes sampled, grade 3, triple-negative, treated adjuvantly with doxorubicin and cyclophosphamide 4 followed by weekly paclitaxel 7, followed by adjuvant radiation  RECURRENT DISEASE: (2) status post right breast upper outer quadrant biopsy and right axillary lymph node biopsy 10/01/2016, both positive for a T2 N1, stage IIIB invasive ductal carcinoma, triple negative, with an MIB-1 of 50-70%  (3) status post right modified radical mastectomy 11/05/2016 showing a pT2 pN1, stage IIIB invasive ductal carcinoma, grade 3, triple negative, with negative margins  (4) not a candidate for radiation given prior history  (5) adjuvant chemotherapy consisting of carboplatin and gemcitabine given days 1 and 8 of each 21 day cycle, for 6 cycles, starting 11/20/2016, completed 03/11/2017             (a) day 8 cycle 2 omitted because of neutropenia; Neupogen/Neulasta added  (5) HIV positivity: under care of Id [facility]ena[facility] [place])  (6) Genetic testing 06/17/2017: no pathogenicmutations.Genes tested: APC, ATM, AXIN2, BARD1, BLM, BMPR1A, BRCA1, BRCA2, BRIP1, CDH1, CDK4, CDKN2A (p14ARF), CDKN2A (p16INK4a), CEBPA, CHEK2, CTNNA1, DICER1, EPCAM*, GATA2, GREM1*, HRAS, KIT, MEN1, MLH1, MSH2, MSH3, MSH6, MUTYH, NBN, NF1, PALB2, PDGFRA, PMS2, POLD1, POLE, PTEN, RAD50, RAD51C, RAD51D, RUNX1, SDHB, SDHC, SDHD, SMAD4, SMARCA4, STK11, TERC, TERT, TP53, TSC1, TSC2, VHL.The following genes were evaluated for sequence changes only: HOXB13*, NTHL1*, SDHA.              (a) A variant of uncertain significance (VUS)in a gene  calledNTHL1was also noted.c.736G>A (p.Ala246Thr)  METASTATIC DISEASE: November 2019 (1) Patient seen in urgent care and ultimately ED on 10/21 for shortness of breath, chest xray demonstrated Right pleural effusion.   (a) right thoracenteses on 10/21 and 11/4 results show atypical cells, non diagnostic (b) CT chest 04/22/2018 shows re-accumulation of fluid, and right pleural nodularity. (c) PET scan on 05/01/2018 shows hypermetabolic pleural based metastases, right CP angle nodal metastases, no evidence of malignancy in abdomen and pelvis. (d) bronchoscopy with biopsy by Dr. Delton Coombes and BAL on 05/15/2018 was non diagnostic             (e) VAT biopsy of the right pleura 05/30/2018           confirms carcinoma, again triple negative             (f) [facility] shows PD-L1 positive (1% in [facility]); otherwise microsatellite stable, TMB low (5/Mb), no PIK3 mutations; other mutations suggest sensitivity to MTOR inhibitors and several TKIs  (2) Atezolizumab started 07/15/2018 given every other week             (a) PET 08/13/2018 shows tumor Right hemithorax (new baseline study)             (b) Atezo changed to Q4w starting with 11/07/2018 dose             (c) PET 11/28/2018 documents progression in the right lung and pleural area as well as lymph nodes, and a T1 skeletal metastasis             (d) atezolizumab discontinued after 12/05/2018 dose  (3) eribulin day 1 and 8 of every 21 day cycle started 12/19/2018             (  a) changed to every other week with Neulsta support due to neutropenia causing treatment delays   PLAN:  Betsaida is doing well today. She has no clinical signs of breast cancer progression. Labs were reviewed with the patient. Onpro was recently added to prevent neutropenia and delays in treatment, which occurred with prior cycles of treatment. Her labs are acceptable for treatment. She will receive day 1 of cycle 3 today as scheduled.   The plan is to restage her disease after  6 doses of eribulin, or sooner if she develops concerning symptoms for progression.   Regarding her mild muscle soreness, she will continue to use her norco and muscle relaxer. She also was encouraged to stretch and move around periodically to prevent neck stiffness in the future. In addition, she will also use a heading pad.   She will continue with her eyedrops as prescribed by her eye doctor for her dry eyes.   Annunziata will return in 2 weeks for labs, f/u, and Eribulin.  We  recommended to continue with the appropriate pandemic precautions.   The patient was advised to call immediately if she has any concerning symptoms in the interval. The patient voices understanding of current disease status and treatment options and is in agreement with the current care plan. All questions were answered. The patient knows to call the clinic with any problems, questions or concerns. We can certainly see the patient much sooner if necessary  No orders of the defined types were placed in this encounter.    Hannahgrace Lalli L Judaea Burgoon, PA-C 02/06/19

## 2019-02-06 NOTE — Patient Instructions (Signed)
[facility] [facility] Discharge Instructions for Patients Receiving Chemotherapy  Today you received the following chemotherapy agents [place].   To help prevent nausea and vomiting after your treatment, we encourage you to take your nausea medication as directed  If you develop nausea and vomiting that is not controlled by your nausea medication, call the clinic.   BELOW ARE SYMPTOMS THAT SHOULD BE REPORTED IMMEDIATELY:  *FEVER GREATER THAN 100.5 F  *CHILLS WITH OR WITHOUT FEVER  NAUSEA AND VOMITING THAT IS NOT CONTROLLED WITH YOUR NAUSEA MEDICATION  *UNUSUAL SHORTNESS OF BREATH  *UNUSUAL BRUISING OR BLEEDING  TENDERNESS IN MOUTH AND THROAT WITH OR WITHOUT PRESENCE OF ULCERS  *URINARY PROBLEMS  *BOWEL PROBLEMS  UNUSUAL RASH Items with * indicate a potential emergency and should be followed up as soon as possible.  Feel free to call the clinic you have any questions or concerns. The clinic phone number is [phone].  Pegfilgrastim injection What is this medicine? PEGFILGRASTIM (PEG fil gra stim) is a long-acting granulocyte colony-stimulating factor that stimulates the growth of neutrophils, a type of white blood cell important in the body's fight against infection. It is used to reduce the incidence of fever and infection in patients with certain types of cancer who are receiving chemotherapy that affects the bone marrow, and to increase survival after being exposed to high doses of radiation. This medicine may be used for other purposes; ask your health care provider or pharmacist if you have questions. COMMON BRAND NAME(S): Steve Rattler, Ziextenzo What should I tell my health care provider before I take this medicine? They need to know if you have any of these conditions:  kidney disease  latex allergy  ongoing radiation therapy  sickle cell disease  skin reactions to acrylic adhesives (On-Body Injector only)  an unusual or allergic reaction  to pegfilgrastim, filgrastim, other medicines, foods, dyes, or preservatives  pregnant or trying to get pregnant  breast-feeding How should I use this medicine? This medicine is for injection under the skin. If you get this medicine at home, you will be taught how to prepare and give the pre-filled syringe or how to use the On-body Injector. Refer to the patient Instructions for Use for detailed instructions. Use exactly as directed. Tell your healthcare provider immediately if you suspect that the On-body Injector may not have performed as intended or if you suspect the use of the On-body Injector resulted in a missed or partial dose. It is important that you put your used needles and syringes in a special sharps container. Do not put them in a trash can. If you do not have a sharps container, call your pharmacist or healthcare provider to get one. Talk to your pediatrician regarding the use of this medicine in children. While this drug may be prescribed for selected conditions, precautions do apply. Overdosage: If you think you have taken too much of this medicine contact a poison control center or emergency room at once. NOTE: This medicine is only for you. Do not share this medicine with others. What if I miss a dose? It is important not to miss your dose. Call your doctor or health care professional if you miss your dose. If you miss a dose due to an On-body Injector failure or leakage, a new dose should be administered as soon as possible using a single prefilled syringe for manual use. What may interact with this medicine? Interactions have not been studied. Give your health care provider a list of all  the medicines, herbs, non-prescription drugs, or dietary supplements you use. Also tell them if you smoke, drink alcohol, or use illegal drugs. Some items may interact with your medicine. This list may not describe all possible interactions. Give your health care provider a list of all the  medicines, herbs, non-prescription drugs, or dietary supplements you use. Also tell them if you smoke, drink alcohol, or use illegal drugs. Some items may interact with your medicine. What should I watch for while using this medicine? You may need blood work done while you are taking this medicine. If you are going to need a MRI, CT scan, or other procedure, tell your doctor that you are using this medicine (On-Body Injector only). What side effects may I notice from receiving this medicine? Side effects that you should report to your doctor or health care professional as soon as possible:  allergic reactions like skin rash, itching or hives, swelling of the face, lips, or tongue  back pain  dizziness  fever  pain, redness, or irritation at site where injected  pinpoint red spots on the skin  red or dark-brown urine  shortness of breath or breathing problems  stomach or side pain, or pain at the shoulder  swelling  tiredness  trouble passing urine or change in the amount of urine Side effects that usually do not require medical attention (report to your doctor or health care professional if they continue or are bothersome):  bone pain  muscle pain This list may not describe all possible side effects. Call your doctor for medical advice about side effects. You may report side effects to FDA at 1-800-FDA-1088. Where should I keep my medicine? Keep out of the reach of children. If you are using this medicine at home, you will be instructed on how to store it. Throw away any unused medicine after the expiration date on the label. NOTE: This sheet is a summary. It may not cover all possible information. If you have questions about this medicine, talk to your doctor, pharmacist, or health care provider.  2020 Elsevier/Gold Standard (2017-09-16 16:57:08)

## 2019-02-07 LAB — CANCER ANTIGEN 27.29: CA 27.29: 42.7 U/mL — ABNORMAL HIGH (ref 0.0–38.6)

## 2019-02-11 ENCOUNTER — Other Ambulatory Visit: Payer: Self-pay | Admitting: Oncology

## 2019-02-11 DIAGNOSIS — C50911 Malignant neoplasm of unspecified site of right female breast: Secondary | ICD-10-CM

## 2019-02-11 MED FILL — LIDOCAINE-PRILOCAINE CREAM: 2.5-2.5 | 1 days supply | Qty: 30 | Fill #0

## 2019-02-12 MED FILL — METHOCARBAMOL 500 MG TABS: 500 | 30 days supply | Qty: 90 | Fill #1

## 2019-02-18 ENCOUNTER — Other Ambulatory Visit: Payer: Self-pay | Admitting: Endocrinology

## 2019-02-19 MED FILL — JULUCA 50-25 MG TAB: 50-25 | 30 days supply | Qty: 30 | Fill #4

## 2019-02-19 MED FILL — PREZCOBIX 800 MG-150 MG TAB: 800-150 | 30 days supply | Qty: 30 | Fill #4

## 2019-02-20 ENCOUNTER — Inpatient Hospital Stay (HOSPITAL_BASED_OUTPATIENT_CLINIC_OR_DEPARTMENT_OTHER): Payer: Medicare HMO | Admitting: Adult Health

## 2019-02-20 ENCOUNTER — Other Ambulatory Visit: Payer: Self-pay

## 2019-02-20 ENCOUNTER — Inpatient Hospital Stay: Payer: Medicare HMO

## 2019-02-20 ENCOUNTER — Encounter: Payer: Self-pay | Admitting: Adult Health

## 2019-02-20 VITALS — BP 106/76 | HR 93 | Temp 98.3°F | Resp 17 | Ht 67.0 in | Wt 233.0 lb

## 2019-02-20 DIAGNOSIS — C782 Secondary malignant neoplasm of pleura: Secondary | ICD-10-CM | POA: Diagnosis not present

## 2019-02-20 DIAGNOSIS — C50911 Malignant neoplasm of unspecified site of right female breast: Secondary | ICD-10-CM

## 2019-02-20 DIAGNOSIS — C7801 Secondary malignant neoplasm of right lung: Secondary | ICD-10-CM

## 2019-02-20 DIAGNOSIS — Z79899 Other long term (current) drug therapy: Secondary | ICD-10-CM | POA: Diagnosis not present

## 2019-02-20 DIAGNOSIS — C50411 Malignant neoplasm of upper-outer quadrant of right female breast: Secondary | ICD-10-CM

## 2019-02-20 DIAGNOSIS — Z171 Estrogen receptor negative status [ER-]: Secondary | ICD-10-CM | POA: Diagnosis not present

## 2019-02-20 DIAGNOSIS — Z95828 Presence of other vascular implants and grafts: Secondary | ICD-10-CM

## 2019-02-20 DIAGNOSIS — Z5111 Encounter for antineoplastic chemotherapy: Secondary | ICD-10-CM | POA: Diagnosis not present

## 2019-02-20 DIAGNOSIS — C787 Secondary malignant neoplasm of liver and intrahepatic bile duct: Secondary | ICD-10-CM

## 2019-02-20 LAB — CBC WITH DIFFERENTIAL (CANCER CENTER ONLY)
Abs Immature Granulocytes: 0.07 10*3/uL (ref 0.00–0.07)
Basophils Absolute: 0.1 10*3/uL (ref 0.0–0.1)
Basophils Relative: 1 %
Eosinophils Absolute: 0.4 10*3/uL (ref 0.0–0.5)
Eosinophils Relative: 4 %
HCT: 37.7 % (ref 36.0–46.0)
Hemoglobin: 12.4 g/dL (ref 12.0–15.0)
Immature Granulocytes: 1 %
Lymphocytes Relative: 18 %
Lymphs Abs: 2.1 10*3/uL (ref 0.7–4.0)
MCH: 30.8 pg (ref 26.0–34.0)
MCHC: 32.9 g/dL (ref 30.0–36.0)
MCV: 93.5 fL (ref 80.0–100.0)
Monocytes Absolute: 1.2 10*3/uL — ABNORMAL HIGH (ref 0.1–1.0)
Monocytes Relative: 10 %
Neutro Abs: 7.8 10*3/uL — ABNORMAL HIGH (ref 1.7–7.7)
Neutrophils Relative %: 66 %
Platelet Count: 251 10*3/uL (ref 150–400)
RBC: 4.03 MIL/uL (ref 3.87–5.11)
RDW: 15.2 % (ref 11.5–15.5)
WBC Count: 11.7 10*3/uL — ABNORMAL HIGH (ref 4.0–10.5)
nRBC: 0 % (ref 0.0–0.2)

## 2019-02-20 LAB — CMP (CANCER CENTER ONLY)
ALT: 13 U/L (ref 0–44)
AST: 37 U/L (ref 15–41)
Albumin: 3.8 g/dL (ref 3.5–5.0)
Alkaline Phosphatase: 75 U/L (ref 38–126)
Anion gap: 12 (ref 5–15)
BUN: 18 mg/dL (ref 8–23)
CO2: 26 mmol/L (ref 22–32)
Calcium: 10.6 mg/dL — ABNORMAL HIGH (ref 8.9–10.3)
Chloride: 102 mmol/L (ref 98–111)
Creatinine: 1.45 mg/dL — ABNORMAL HIGH (ref 0.44–1.00)
GFR, Est AFR Am: 44 mL/min — ABNORMAL LOW (ref >60)
GFR, Estimated: 38 mL/min — ABNORMAL LOW (ref >60)
Glucose, Bld: 85 mg/dL (ref 70–99)
Potassium: 3.7 mmol/L (ref 3.5–5.1)
Sodium: 140 mmol/L (ref 135–145)
Total Bilirubin: 0.4 mg/dL (ref 0.3–1.2)
Total Protein: 8.3 g/dL — ABNORMAL HIGH (ref 6.5–8.1)

## 2019-02-20 LAB — TSH: TSH: 0.841 u[IU]/mL (ref 0.308–3.960)

## 2019-02-20 MED ORDER — SODIUM CHLORIDE 0.9% FLUSH
10.0000 mL | Freq: Once | INTRAVENOUS | Status: AC
  Administered 2019-02-20: 10 mL
  Filled 2019-02-20: qty 10

## 2019-02-20 MED ORDER — PEGFILGRASTIM 6 MG/0.6ML ~~LOC~~ PSKT
PREFILLED_SYRINGE | SUBCUTANEOUS | Status: AC
  Filled 2019-02-20: qty 0.6

## 2019-02-20 MED ORDER — HEPARIN SOD (PORK) LOCK FLUSH 100 UNIT/ML IV SOLN
500.0000 [IU] | Freq: Once | INTRAVENOUS | Status: AC | PRN
  Administered 2019-02-20: 16:00:00 500 [IU]
  Filled 2019-02-20: qty 5

## 2019-02-20 MED ORDER — SODIUM CHLORIDE 0.9% FLUSH
10.0000 mL | INTRAVENOUS | Status: DC | PRN
  Administered 2019-02-20: 16:00:00 10 mL
  Filled 2019-02-20: qty 10

## 2019-02-20 MED ORDER — SODIUM CHLORIDE 0.9 % IV SOLN
Freq: Once | INTRAVENOUS | Status: AC
  Administered 2019-02-20: 14:00:00 via INTRAVENOUS
  Filled 2019-02-20: qty 250

## 2019-02-20 MED ORDER — PROCHLORPERAZINE MALEATE 10 MG PO TABS
ORAL_TABLET | ORAL | Status: AC
  Filled 2019-02-20: qty 1

## 2019-02-20 MED ORDER — PEGFILGRASTIM 6 MG/0.6ML ~~LOC~~ PSKT
6.0000 mg | PREFILLED_SYRINGE | Freq: Once | SUBCUTANEOUS | Status: AC
  Administered 2019-02-20: 6 mg via SUBCUTANEOUS

## 2019-02-20 MED ORDER — SODIUM CHLORIDE 0.9 % IV SOLN
1.2000 mg/m2 | Freq: Once | INTRAVENOUS | Status: AC
  Administered 2019-02-20: 2.75 mg via INTRAVENOUS
  Filled 2019-02-20: qty 5.5

## 2019-02-20 MED ORDER — PROCHLORPERAZINE MALEATE 10 MG PO TABS
10.0000 mg | ORAL_TABLET | Freq: Once | ORAL | Status: AC
  Administered 2019-02-20: 14:00:00 10 mg via ORAL

## 2019-02-20 NOTE — Progress Notes (Signed)
[place]  [phone] [phone]) [phone]    ID: AARALYNN SHEPHEARD DOB: 03-Jan-1956  MR#: [id number]  NIO#:[id number]  Patient Care Team: Lauree Chandler, NP as PCP - General (Nurse Practitioner) Tommy Medal, Lavell Islam, MD as PCP - Infectious Diseases (Infectious Diseases) Clent Jacks, MD as Consulting Physician (Ophthalmology) Renato Shin, MD as Consulting Physician (Endocrinology) Frederik Pear, MD as Consulting Physician (Orthopedic Surgery) Magrinat, Virgie Dad, MD as Consulting Physician (Oncology) Fanny Skates, MD as Consulting Physician (General Surgery) Donnamae Jude, MD as Consulting Physician (Obstetrics and Gynecology) Delice Bison, Charlestine Massed, NP as Nurse Practitioner (Hematology and Oncology) Webb Laws, [employer] as Referring Physician (Optometry) OTHER MD:   CHIEF COMPLAINT: Triple negative breast cancer, recurrent  CURRENT TREATMENT:  eribulin  INTERVAL HISTORY: Blair returns today for follow-up and treatment of her recurrent triple negative breast cancer. (63 years old)  She is receiving Eribulin given every two weeks.  Maurine notes that she is tolerating this well.  She is here for her fifth dose of treatment.    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  This is very stressful for her, but she has remained focused on her faith and remembering that she is helping children receive an education, who may not otherwise be able to.  She denies any chest pain and she joked that she is happy with how good her blood pressure is doing considering the past two weeks.   Devanee denies any fever or chills.  She has intermittent pain/soreness and she takes Norco for this.  She tolerates it well.  She denies any increased constipation or sleepiness.  She has no new pain or new shortness of breath.  She has no difficulty with mobility.  Her appetite is  stable.  She has no unusual headaches or vision changes.  She denies nausea, vomiting, bladder concerns.  A detailed ROS was otherwise non contributory.    BREAST CANCER HISTORY: From the original intake note:  Shatona is a history of right-sided breast cancer dating back to 2005. She had a lumpectomy with sentinel lymph node sampling, chemotherapy, and radiation. I do not have access to those records at present.  More recently she had bilateral screening mammography at the [facility] 09/25/2016 showing a possible mass in the right breast. Diagnostic mammography with ultrasonography on 09/28/2016 the patient underwent right diagnostic mammography with tomography and right breast ultrasonography. The breast density was category A. In the right breast at the 10:00 position there was an irregular mass measuring 2.5 cm. Ultrasound identified this the 10:00 radiant 10 cm from the nipple measuring 2.4 cm. In the right axilla there was an abnormal lymph node measuring 1.3 cm with other normal-appearing lymph nodes.  On 10/01/2016 she  underwent biopsy of the right breast mass in question as well as the suspicious axillary lymph node. 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 The proliferation marker was 70% in the breast lesion and 50% in the lymph node.  Her subsequent history is as detailed below.   MEDICAL HISTORY: Past Medical History:  Diagnosis Date  . Alopecia areata 11/28/2009  . Bell's palsy   . Cancer [facility]) 2005   Breast cancer   chemotherapy and radiation  . CKD (chronic kidney disease) stage 3, GFR 30-59 ml/min (HCC) 06/08/2015  . Dry eye syndrome   .  Family history of lung cancer   . Family history of non-Hodgkin's lymphoma   . Fasting hyperglycemia   . Gestational diabetes    2001  . HIP FRACTURE, RIGHT 05/06/2008  . History of  kidney stones   . HIV DISEASE 03/27/2006  . HYPERLIPIDEMIA, MIXED 12/15/2007  . HYPERTENSION 03/27/2006  . 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 MENORRHAGIA, POSTMENOPAUSAL 02/03/2009  . Osteoarthritis of left knee 06/08/2015  . OSTEOARTHROSIS, LOCAL, SCND, UNSPC SITE 04/07/2007  . Personal history of chemotherapy 11/2018  . PVD 04/07/2007  . Tinea capitis   . TRIGGER FINGER 05/06/2008  . Unspecified vitamin D deficiency 08/06/2007    ALLERGIES:  is allergic to lisinopril; pepcid [famotidine]; prilosec [omeprazole]; and tums [calcium carbonate antacid].  MEDICATIONS:  Current Outpatient Medications  Medication Sig Dispense Refill  . CALCIUM CITRATE-VITAMIN D3 PO Take by mouth. 630 mg of calcium and 13 mcg d3, take once daily    . Cholecalciferol (D3 ADULT PO) Take 25 mcg by mouth daily.    . Coenzyme Q10 (COQ10) 100 MG CAPS Take by mouth daily.    Marland Kitchen dexamethasone (DECADRON) 4 MG tablet Take 1-2 tablets (4-8 mg total) by mouth 2 (two) times daily as needed. 40 tablet 6  . ELDERBERRY PO Take 50 mg by mouth daily.    Marland Kitchen HYDROcodone-acetaminophen (NORCO/VICODIN) 5-325 MG tablet TAKE ONE TABLET 4 TIMES A DAY AS INSTRUCTED 120 tablet 0  . hydrocortisone cream 1 % Apply 1 application topically daily as needed for itching.    . JULUCA 50-25 MG TABS TAKE 1 TABLET BY MOUTH DAILY WITH BREAKFAST. TAKE WITH THE PREZCOBIX. 30 tablet 5  . levothyroxine (SYNTHROID) 200 MCG tablet Take 1 tablet (200 mcg total) by mouth daily before breakfast. 90 tablet 1  . levothyroxine (SYNTHROID) 200 MCG tablet Take 1 tablet (200 mcg total) by mouth daily before breakfast. 90 tablet 1  . lidocaine-prilocaine (EMLA) cream APPLY 1 APPLICATION TOPICALLY AS NEEDED. APPLY TO PORT SITE 1 HOUR PRIOR TO ACCESS 30 g 0  . Loratadine 10 MG CAPS Take by mouth as needed.    . Menthol, Topical Analgesic, (BENGAY EX) Apply 1 application topically daily as needed (muscle pain).    . methocarbamol (ROBAXIN) 500 MG tablet Take 1  tablet (500 mg total) by mouth every 8 (eight) hours as needed for muscle spasms. 90 tablet 1  . Methylsulfonylmethane (MSM) 1000 MG TABS Take by mouth daily.    . Naphazoline-Glycerin (REDNESS RELIEF OP) Place 1 drop into both eyes daily as needed (redness).    Marland Kitchen olmesartan-hydrochlorothiazide (BENICAR HCT) 40-25 MG tablet TAKE 1 TABLET BY MOUTH DAILY. 30 tablet 1  . Omega-3 Fatty Acids (FISH OIL) 1000 MG CAPS Take by mouth daily.    . potassium gluconate 595 (99 K) MG TABS tablet Take 595 mg by mouth daily.    Marland Kitchen PREZCOBIX 800-150 MG tablet TAKE 1 TABLET BY MOUTH DAILY. SWALLOW WHOLE. DO NOT CRUSH, BREAK OR CHEW TABLETS. TAKE WITH FOOD. 30 tablet 5  . Probiotic Product (PROBIOTIC DAILY PO) Take by mouth daily.    . prochlorperazine (COMPAZINE) 10 MG tablet Take 1 tablet (10 mg total) by mouth every 6 (six) hours as needed for nausea or vomiting. 30 tablet 4  . SELZENTRY 150 MG tablet TAKE ONE TABLET BY MOUTH TWICE DAILY 60 tablet 5  . Study - REPRIEVE [id number] - pitavastatin 4 mg or placebo tablet (PI-[place]) Take 1 tablet (4 mg total) by mouth daily. 30 tablet   . tobramycin-dexamethasone (  TOBRADEX) ophthalmic solution Place 2 drops into the right eye every 4 (four) hours while awake. 5 mL 0  . Vitamin E 180 MG CAPS Take by mouth daily.     No current facility-administered medications for this visit.    Facility-Administered Medications Ordered in Other Visits  Medication Dose Route Frequency Provider Last Rate Last Dose  . eriBULin mesylate ([place]) 2.75 mg in sodium chloride 0.9 % 100 mL chemo infusion  1.2 mg/m2 (Treatment Plan Recorded) Intravenous Once Magrinat, Virgie Dad, MD      . heparin lock flush 100 unit/mL  500 Units Intracatheter Once PRN Magrinat, Virgie Dad, MD      . pegfilgrastim (NEULASTA ONPRO KIT) injection 6 mg  6 mg Subcutaneous Once Magrinat, Virgie Dad, MD      . sodium chloride flush (NS) 0.9 % injection 10 mL  10 mL Intracatheter PRN Magrinat, Virgie Dad, MD         SURGICAL HISTORY:  Past Surgical History:  Procedure Laterality Date  . BREAST LUMPECTOMY Right    2005  . CHEST TUBE INSERTION Right 05/30/2018   Procedure: INSERTION PLEURAL DRAINAGE CATHETER;  Surgeon: Grace Isaac, MD;  Location: [place];  Service: Thoracic;  Laterality: Right;  . COLONOSCOPY    . COLONOSCOPY WITH ESOPHAGOGASTRODUODENOSCOPY (EGD)    . ENDOBRONCHIAL ULTRASOUND Bilateral 05/15/2018   Procedure: ENDOBRONCHIAL ULTRASOUND;  Surgeon: Collene Gobble, MD;  Location: [facility];  Service: Cardiopulmonary;  Laterality: Bilateral;  . FINE NEEDLE ASPIRATION BIOPSY  05/15/2018   Procedure: FINE NEEDLE ASPIRATION BIOPSY;  Surgeon: Collene Gobble, MD;  Location: [facility];  Service: Cardiopulmonary;;  . FLEXIBLE BRONCHOSCOPY  05/15/2018   Procedure: FLEXIBLE BRONCHOSCOPY;  Surgeon: Collene Gobble, MD;  Location: [facility];  Service: Cardiopulmonary;;  . HYSTEROSCOPY  2006  . IR IMAGING GUIDED PORT INSERTION  07/14/2018  . IR THORACENTESIS ASP PLEURAL SPACE W/IMG GUIDE  04/14/2018  . IR THORACENTESIS ASP PLEURAL SPACE W/IMG GUIDE  04/28/2018  . KNEE ARTHROSCOPY Left 2002  . LYMPH NODE DISSECTION  2005  . MASTECTOMY Right   . MASTECTOMY MODIFIED RADICAL Right 11/05/2016   Procedure: RIGHT MASTECTOMY MODIFIED RADICAL;  Surgeon: Fanny Skates, MD;  Location: [place];  Service: General;  Laterality: Right;  . MODIFIED RADICAL MASTECTOMY Right 11/05/2016  . placement   of port-a-cath    . PLEURAL BIOPSY Right 05/30/2018   Procedure: PLEURAL BIOPSY;  Surgeon: Grace Isaac, MD;  Location: [place];  Service: Thoracic;  Laterality: Right;  . PLEURAL EFFUSION DRAINAGE Right 05/30/2018   Procedure: DRAINAGE OF PLEURAL EFFUSION;  Surgeon: Grace Isaac, MD;  Location: [place];  Service: Thoracic;  Laterality: Right;  . PORT-A-CATH REMOVAL  2006   insertion 2005  . PORT-A-CATH REMOVAL N/A 03/29/2017   Procedure: REMOVAL PORT-A-CATH;  Surgeon: Fanny Skates, MD;  Location:  [place];  Service: General;  Laterality: N/A;  . PORTACATH PLACEMENT Right 11/05/2016   Procedure: INSERTION PORT-A-CATH WITH ULTRA SOUND;  Surgeon: Fanny Skates, MD;  Location: [place];  Service: General;  Laterality: Right;  . removal of port a cath  12/2014  . TALC PLEURODESIS Right 05/30/2018   Procedure: Pietro Cassis;  Surgeon: Grace Isaac, MD;  Location: [place];  Service: Thoracic;  Laterality: Right;  . THYROID SURGERY  2009   Ablation   . TOTAL KNEE ARTHROPLASTY Left 02/06/2016   Procedure: TOTAL KNEE ARTHROPLASTY;  Surgeon: Frederik Pear, MD;  Location: [place];  Service: Orthopedics;  Laterality: Left;  .  TUBAL LIGATION    . VIDEO ASSISTED THORACOSCOPY Right 05/30/2018   Procedure: VIDEO ASSISTED THORACOSCOPY;  Surgeon: Grace Isaac, MD;  Location: [place];  Service: Thoracic;  Laterality: Right;    GYNECOLOGIC HISTORY:  Patient's last menstrual period was 11/06/2003.  menarche age 17, first live birth age 7, the patient is [place] P2. She stopped having periods in 2005, with her chemotherapy; she never took hormone replacement    SOCIAL HISTORY:  Everlena worked for the [employer] more than 20 years. 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 At home she lives with her daughter Orland Dec who works for [employer] and her granddaughter Donita Brooks, 86 y/o as of APRIL 2018.  The patient's son also drops infrequently.  They are all careful regarding pandemic precautions                          ADVANCED DIRECTIVES:  not in place   PHYSICAL EXAMINATION: Today's Vitals   02/20/19 1348  BP: 106/76  Pulse: 93  Resp: 17  Temp: 98.3 F (36.8 C)  TempSrc: Oral  SpO2: 97%  Weight: 233 lb (105.7 kg)  Height: _0  (1.702 m)   Body mass index is 36.49 kg/m. ECOG PERFORMANCE STATUS: 1 - Symptomatic but completely ambulatory GENERAL: Patient is a well appearing female in no acute distress HEENT:  Oropharynx clear and moist. No ulcerations  or evidence of oropharyngeal candidiasis. Neck is supple.  NODES:  No cervical, supraclavicular, or axillary lymphadenopathy palpated.  BREAST EXAM:  Deferred. LUNGS:  Slightly diminished in right lower lobe, otherwise clear throughout.  No wheezes or rhonchi. HEART:  Regular rate and rhythm. No murmur appreciated. ABDOMEN:  Soft, nontender. normoactive bowel sounds. No organomegaly palpated. MSK:  No focal spinal tenderness to palpation. Full range of motion bilaterally in the upper extremities. EXTREMITIES:  No peripheral edema.   SKIN:  Clear with no obvious rashes or skin changes. No nail dyscrasia. NEURO:  Nonfocal. Well oriented.  Appropriate affect.  LABORATORY DATA: Lab Results  Component Value Date   WBC 11.7 (H) 02/20/2019   HGB 12.4 02/20/2019   HCT 37.7 02/20/2019   MCV 93.5 02/20/2019   PLT 251 02/20/2019      Chemistry      Component Value Date/Time   NA 140 02/20/2019 1324   NA 140 05/13/2017 0927   K 3.7 02/20/2019 1324   K 4.1 05/13/2017 0927   CL 102 02/20/2019 1324   CO2 26 02/20/2019 1324   CO2 29 05/13/2017 0927   BUN 18 02/20/2019 1324   BUN 17.7 05/13/2017 0927   CREATININE 1.45 (H) 02/20/2019 1324   CREATININE 1.62 (H) 09/18/2018 0911   CREATININE 1.4 (H) 05/13/2017 0927   GLU 104 02/13/2016 1358      Component Value Date/Time   CALCIUM 10.6 (H) 02/20/2019 1324   CALCIUM 10.5 (H) 05/13/2017 0927   ALKPHOS 75 02/20/2019 1324   ALKPHOS 90 05/13/2017 0927   AST 37 02/20/2019 1324   AST 14 05/13/2017 0927   ALT 13 02/20/2019 1324   ALT 12 05/13/2017 0927   BILITOT 0.4 02/20/2019 1324   BILITOT 0.27 05/13/2017 0927       RADIOGRAPHIC STUDIES:  No results found.   ASSESSMENT: 63 y.o. [place] woman   (1) status post right lumpectomy and sentinel lymph node sampling October 2005 for a 0.6 cm invasive ductal carcinoma involving one out of 2  sentinel lymph nodes sampled, grade 3, triple-negative, treated adjuvantly with doxorubicin and  cyclophosphamide 4 followed by weekly paclitaxel 7, followed by adjuvant radiation  RECURRENT DISEASE: (2) status post right breast upper outer quadrant biopsy and right axillary lymph node biopsy 10/01/2016, both positive for a T2 N1, stage IIIB invasive ductal carcinoma, triple negative, with an MIB-1 of 50-70%  (3) status post right modified radical mastectomy 11/05/2016 showing a pT2 pN1, stage IIIB invasive ductal carcinoma, grade 3, triple negative, with negative margins  (4) not a candidate for radiation given prior history  (5) adjuvant chemotherapy consisting of carboplatin and gemcitabine given days 1 and 8 of each 21 day cycle, for 6 cycles, starting 11/20/2016, completed 03/11/2017             (a) day 8 cycle 2 omitted because of neutropenia; Neupogen/Neulasta added  (5) HIV positivity: under care of Id Lucianne [place])  (6) Genetic testing 06/17/2017: no pathogenicmutations.Genes tested: APC, ATM, AXIN2, BARD1, BLM, BMPR1A, BRCA1, BRCA2, BRIP1, CDH1, CDK4, CDKN2A (p14ARF), CDKN2A (p16INK4a), CEBPA, CHEK2, CTNNA1, DICER1, EPCAM*, GATA2, GREM1*, HRAS, KIT, MEN1, MLH1, MSH2, MSH3, MSH6, MUTYH, NBN, NF1, PALB2, PDGFRA, PMS2, POLD1, POLE, PTEN, RAD50, RAD51C, RAD51D, RUNX1, SDHB, SDHC, SDHD, SMAD4, SMARCA4, STK11, TERC, TERT, TP53, TSC1, TSC2, VHL.The following genes were evaluated for sequence changes only: HOXB13*, NTHL1*, SDHA.              (a) A variant of uncertain significance (VUS)in a gene calledNTHL1was also noted.c.736G>A (p.Ala246Thr)  METASTATIC DISEASE: November 2019 (1) Patient seen in urgent care and ultimately ED on 10/21 for shortness of breath, chest xray demonstrated Right pleural effusion.   (a) right thoracenteses on 10/21 and 11/4 results show atypical cells, non diagnostic (b) CT chest 04/22/2018 shows re-accumulation of fluid, and right pleural nodularity. (c) PET scan on 05/01/2018 shows hypermetabolic pleural based metastases, right CP angle nodal  metastases, no evidence of malignancy in abdomen and pelvis. (d) bronchoscopy with biopsy by Dr. Lamonte Sakai and BAL on 05/15/2018 was non diagnostic             (e) VAT biopsy of the right pleura 05/30/2018           confirms carcinoma, again triple negative             (f) [facility] shows PD-L1 positive (1% in [facility]); otherwise microsatellite stable, TMB low (5/Mb), no PIK3 mutations; other mutations suggest sensitivity to MTOR inhibitors and several TKIs  (2) Atezolizumab started 07/15/2018 given every other week             (a) PET 08/13/2018 shows tumor Right hemithorax (new baseline study)             (b) Atezo changed to Q4w starting with 11/07/2018 dose             (c) PET 11/28/2018 documents progression in the right lung and pleural area as well as lymph nodes, and a T1 skeletal metastasis             (d) atezolizumab discontinued after 12/05/2018 dose  (3) eribulin day 1 and 8 of every 21 day cycle started 12/19/2018             (a) changed to every other week with Neulsta support due to neutropenia causing treatment delays   PLAN:  Terrisha is doing well today.  She is tolerating her treatment well and has no clinical signs of progression.  Her labs are stable and she will proceed with her next  dose of Eribulin today.    Michell and I discussed her treatments.  She is going to be due for PET scan after her 9/11 dose.  I have placed orders for this today.    We reviewed healthy diet, exercise, use of stool softeners for constipation, and water intake.  She will return in 2 weeks for labs, f/u, and her next dose of Eribulin.  She was recommended to continue with the appropriate pandemic precautions. She knows to call for any questions that may arise between now and her next appointment.  We are happy to see her sooner if needed.  A total of (20) minutes of face-to-face time was spent with this patient with greater than 50% of that time in counseling and care-coordination.   Wilber Bihari, NP 02/20/19

## 2019-02-21 LAB — CANCER ANTIGEN 27.29: CA 27.29: 41.6 U/mL — ABNORMAL HIGH (ref 0.0–38.6)

## 2019-03-05 ENCOUNTER — Telehealth: Payer: Self-pay | Admitting: Adult Health

## 2019-03-05 NOTE — Telephone Encounter (Signed)
I called and spoke with Dr. Saverio Danker today for about 10 minutes about patient, Kayla Price.  He says that they have already denied the Onpro.  He notes no record of previous authorization of this (patient received 4 times previously).  I reviewed with him the fact that patient had been neutropenic previously and has HIV.  He said he couldn't do anything about reversing it at this point, because it was too late to do a peer to peer and that it would have to be appealed to the health plan.  He noted not receiving any additional clinical information that they had requested--progress notes, labs, etc. He suggested giving her Udenyca or Fulphilia on Saturday instead.    He notes that patient needs scans and he approved one dose of Eribulin, however didn't approve any further.  I reviewed with him that her PET scan is 9/14.  He recommended submitting a continuance for this, so that the scans can be done, and then decisions can be made about further treatment.  Thanks,   Mendel Ryder

## 2019-03-06 ENCOUNTER — Other Ambulatory Visit: Payer: Self-pay

## 2019-03-06 ENCOUNTER — Encounter: Payer: Self-pay | Admitting: Adult Health

## 2019-03-06 ENCOUNTER — Inpatient Hospital Stay (HOSPITAL_BASED_OUTPATIENT_CLINIC_OR_DEPARTMENT_OTHER): Payer: Medicare HMO | Admitting: Adult Health

## 2019-03-06 ENCOUNTER — Inpatient Hospital Stay: Payer: Medicare HMO

## 2019-03-06 ENCOUNTER — Inpatient Hospital Stay: Payer: Medicare HMO | Attending: Adult Health

## 2019-03-06 VITALS — BP 113/82 | HR 85 | Temp 98.5°F | Resp 17 | Ht 67.0 in | Wt 228.8 lb

## 2019-03-06 DIAGNOSIS — Z79899 Other long term (current) drug therapy: Secondary | ICD-10-CM | POA: Diagnosis not present

## 2019-03-06 DIAGNOSIS — Z171 Estrogen receptor negative status [ER-]: Secondary | ICD-10-CM | POA: Diagnosis not present

## 2019-03-06 DIAGNOSIS — C782 Secondary malignant neoplasm of pleura: Secondary | ICD-10-CM | POA: Insufficient documentation

## 2019-03-06 DIAGNOSIS — C770 Secondary and unspecified malignant neoplasm of lymph nodes of head, face and neck: Secondary | ICD-10-CM | POA: Diagnosis not present

## 2019-03-06 DIAGNOSIS — Z9011 Acquired absence of right breast and nipple: Secondary | ICD-10-CM | POA: Insufficient documentation

## 2019-03-06 DIAGNOSIS — Z170 Estrogen receptor positive status [ER+]: Secondary | ICD-10-CM | POA: Insufficient documentation

## 2019-03-06 DIAGNOSIS — C7951 Secondary malignant neoplasm of bone: Secondary | ICD-10-CM | POA: Diagnosis not present

## 2019-03-06 DIAGNOSIS — C50411 Malignant neoplasm of upper-outer quadrant of right female breast: Secondary | ICD-10-CM | POA: Diagnosis not present

## 2019-03-06 DIAGNOSIS — Z21 Asymptomatic human immunodeficiency virus [HIV] infection status: Secondary | ICD-10-CM | POA: Insufficient documentation

## 2019-03-06 DIAGNOSIS — Z5111 Encounter for antineoplastic chemotherapy: Secondary | ICD-10-CM | POA: Diagnosis not present

## 2019-03-06 DIAGNOSIS — Z95828 Presence of other vascular implants and grafts: Secondary | ICD-10-CM

## 2019-03-06 DIAGNOSIS — C7989 Secondary malignant neoplasm of other specified sites: Secondary | ICD-10-CM | POA: Insufficient documentation

## 2019-03-06 DIAGNOSIS — C773 Secondary and unspecified malignant neoplasm of axilla and upper limb lymph nodes: Secondary | ICD-10-CM | POA: Diagnosis not present

## 2019-03-06 DIAGNOSIS — C50911 Malignant neoplasm of unspecified site of right female breast: Secondary | ICD-10-CM

## 2019-03-06 LAB — CMP (CANCER CENTER ONLY)
ALT: 10 U/L (ref 0–44)
AST: 35 U/L (ref 15–41)
Albumin: 3.8 g/dL (ref 3.5–5.0)
Alkaline Phosphatase: 71 U/L (ref 38–126)
Anion gap: 8 (ref 5–15)
BUN: 15 mg/dL (ref 8–23)
CO2: 29 mmol/L (ref 22–32)
Calcium: 10.3 mg/dL (ref 8.9–10.3)
Chloride: 102 mmol/L (ref 98–111)
Creatinine: 1.43 mg/dL — ABNORMAL HIGH (ref 0.44–1.00)
GFR, Est AFR Am: 45 mL/min — ABNORMAL LOW (ref >60)
GFR, Estimated: 39 mL/min — ABNORMAL LOW (ref >60)
Glucose, Bld: 85 mg/dL (ref 70–99)
Potassium: 3.7 mmol/L (ref 3.5–5.1)
Sodium: 139 mmol/L (ref 135–145)
Total Bilirubin: 0.4 mg/dL (ref 0.3–1.2)
Total Protein: 8 g/dL (ref 6.5–8.1)

## 2019-03-06 LAB — CBC WITH DIFFERENTIAL (CANCER CENTER ONLY)
Abs Immature Granulocytes: 0.06 10*3/uL (ref 0.00–0.07)
Basophils Absolute: 0.1 10*3/uL (ref 0.0–0.1)
Basophils Relative: 1 %
Eosinophils Absolute: 0.5 10*3/uL (ref 0.0–0.5)
Eosinophils Relative: 4 %
HCT: 36.4 % (ref 36.0–46.0)
Hemoglobin: 11.8 g/dL — ABNORMAL LOW (ref 12.0–15.0)
Immature Granulocytes: 1 %
Lymphocytes Relative: 19 %
Lymphs Abs: 2.2 10*3/uL (ref 0.7–4.0)
MCH: 30.9 pg (ref 26.0–34.0)
MCHC: 32.4 g/dL (ref 30.0–36.0)
MCV: 95.3 fL (ref 80.0–100.0)
Monocytes Absolute: 1.3 10*3/uL — ABNORMAL HIGH (ref 0.1–1.0)
Monocytes Relative: 12 %
Neutro Abs: 7.3 10*3/uL (ref 1.7–7.7)
Neutrophils Relative %: 63 %
Platelet Count: 260 10*3/uL (ref 150–400)
RBC: 3.82 MIL/uL — ABNORMAL LOW (ref 3.87–5.11)
RDW: 15.7 % — ABNORMAL HIGH (ref 11.5–15.5)
WBC Count: 11.4 10*3/uL — ABNORMAL HIGH (ref 4.0–10.5)
nRBC: 0 % (ref 0.0–0.2)

## 2019-03-06 MED ORDER — PROCHLORPERAZINE MALEATE 10 MG PO TABS
ORAL_TABLET | ORAL | Status: AC
  Filled 2019-03-06: qty 1

## 2019-03-06 MED ORDER — PROCHLORPERAZINE MALEATE 10 MG PO TABS
10.0000 mg | ORAL_TABLET | Freq: Once | ORAL | Status: AC
  Administered 2019-03-06: 16:00:00 10 mg via ORAL

## 2019-03-06 MED ORDER — SODIUM CHLORIDE 0.9 % IV SOLN
1.2000 mg/m2 | Freq: Once | INTRAVENOUS | Status: AC
  Administered 2019-03-06: 2.75 mg via INTRAVENOUS
  Filled 2019-03-06: qty 5.5

## 2019-03-06 MED ORDER — SODIUM CHLORIDE 0.9 % IV SOLN
Freq: Once | INTRAVENOUS | Status: AC
  Administered 2019-03-06: 15:00:00 via INTRAVENOUS
  Filled 2019-03-06: qty 250

## 2019-03-06 MED ORDER — SODIUM CHLORIDE 0.9% FLUSH
10.0000 mL | Freq: Once | INTRAVENOUS | Status: AC
  Administered 2019-03-06: 10 mL
  Filled 2019-03-06: qty 10

## 2019-03-06 MED ORDER — SODIUM CHLORIDE 0.9% FLUSH
10.0000 mL | INTRAVENOUS | Status: DC | PRN
  Administered 2019-03-06: 10 mL
  Filled 2019-03-06: qty 10

## 2019-03-06 MED ORDER — HEPARIN SOD (PORK) LOCK FLUSH 100 UNIT/ML IV SOLN
500.0000 [IU] | Freq: Once | INTRAVENOUS | Status: AC | PRN
  Administered 2019-03-06: 17:00:00 500 [IU]
  Filled 2019-03-06: qty 5

## 2019-03-06 NOTE — Patient Instructions (Signed)
[facility] Discharge Instructions for Patients Receiving Chemotherapy  Today you received the following chemotherapy agents [place].   To help prevent nausea and vomiting after your treatment, we encourage you to take your nausea medication as directed  If you develop nausea and vomiting that is not controlled by your nausea medication, call the clinic.   BELOW ARE SYMPTOMS THAT SHOULD BE REPORTED IMMEDIATELY:  *FEVER GREATER THAN 100.5 F  *CHILLS WITH OR WITHOUT FEVER  NAUSEA AND VOMITING THAT IS NOT CONTROLLED WITH YOUR NAUSEA MEDICATION  *UNUSUAL SHORTNESS OF BREATH  *UNUSUAL BRUISING OR BLEEDING  TENDERNESS IN MOUTH AND THROAT WITH OR WITHOUT PRESENCE OF ULCERS  *URINARY PROBLEMS  *BOWEL PROBLEMS  UNUSUAL RASH Items with * indicate a potential emergency and should be followed up as soon as possible.  Feel free to call the clinic you have any questions or concerns. The clinic phone number is [phone].

## 2019-03-06 NOTE — Progress Notes (Signed)
I spoke w/  Kayla Price, it sounds like the Udenyca/pegfilgrastim was denied by insurance. Per Mendel Ryder, we will continue w/ eribulin today at the same dose. Udenyca injections will be cancelled but the patient will come back next week for CBC check. Mendel Ryder is coordinating lab check and financial team was requested to talk to the patient about the denial.   Demetrius Charity, PharmD, [facility] Pharmacist Pharmacy Phone: [phone] 03/06/2019

## 2019-03-06 NOTE — Progress Notes (Signed)
[facility]  [phone] [phone]    ID: Kayla Price DOB: 04-09-1956  MR#: [id number]  BJS#:[id number]  Patient Care Team: Lauree Chandler, NP as PCP - General (Nurse Practitioner) Tommy Medal, Lavell Islam, MD as PCP - Infectious Diseases (Infectious Diseases) Clent Jacks, MD as Consulting Physician (Ophthalmology) Renato Shin, MD as Consulting Physician (Endocrinology) Frederik Pear, MD as Consulting Physician (Orthopedic Surgery) Magrinat, Virgie Dad, MD as Consulting Physician (Oncology) Fanny Skates, MD as Consulting Physician (General Surgery) Donnamae Jude, MD as Consulting Physician (Obstetrics and Gynecology) Delice Bison, Charlestine Massed, NP as Nurse Practitioner (Hematology and Oncology) Webb Laws, [place] as Referring Physician (Optometry) OTHER MD:   CHIEF COMPLAINT: Triple negative breast cancer, recurrent  CURRENT TREATMENT:  eribulin  INTERVAL HISTORY: Kayla Price returns today for follow-up and treatment of her recurrent triple negative breast cancer.  She is receiving Eribulin given every two weeks.  Kayla Price notes that she is tolerating this well.  She is here for her sixth dose of treatment.    REVIEW OF SYSTEMS: Kayla Price denies any new issues.  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  She does note some mild stress with her boss, but otherwise she has been feeling ok.  She is down 8 pounds from her last visit.  She thinks that is from working.  She says that she is feeling good.  She has no chest pain, cough, palpitations, nausea, vomiting, bowel/bladder changes.  She is without fever or chills.  A detailed ROS was otherwise non contributory.    BREAST CANCER HISTORY: From the original intake note:  Kayla Price is a history of right-sided breast cancer dating back to 2005. She had a lumpectomy with sentinel lymph node sampling, chemotherapy, and radiation. I do not have access  to those records at present.  More recently she had bilateral screening mammography at the [facility] 09/25/2016 showing a possible mass in the right breast. Diagnostic mammography with ultrasonography on 09/28/2016 the patient underwent right diagnostic mammography with tomography and right breast ultrasonography. The breast density was category A. In the right breast at the 10:00 position there was an irregular mass measuring 2.5 cm. Ultrasound identified this the 10:00 radiant 10 cm from the nipple measuring 2.4 cm. In the right axilla there was an abnormal lymph node measuring 1.3 cm with other normal-appearing lymph nodes.  On 10/01/2016 she  underwent biopsy of the right breast mass in question as well as the suspicious axillary lymph node. 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 The proliferation marker was 70% in the breast lesion and 50% in the lymph node.  Her subsequent history is as detailed below.   MEDICAL HISTORY: Past Medical History:  Diagnosis Date   Alopecia areata 11/28/2009   Bell's palsy    Cancer ([place]) 2005   Breast cancer   chemotherapy and radiation   CKD (chronic kidney disease) stage 3, GFR 30-59 ml/min (HCC) 06/08/2015   Dry eye syndrome    Family history of lung cancer    Family history of non-Hodgkin's lymphoma    Fasting hyperglycemia    Gestational diabetes    2001   HIP FRACTURE, RIGHT 05/06/2008   History of kidney stones    HIV DISEASE 03/27/2006   HYPERLIPIDEMIA, MIXED 12/15/2007   HYPERTENSION 03/27/2006   HYPOTHYROIDISM, POST-RADIATION 06/28/2008   MENORRHAGIA, POSTMENOPAUSAL  02/03/2009   Osteoarthritis of left knee 06/08/2015   OSTEOARTHROSIS, LOCAL, SCND, UNSPC SITE 04/07/2007   Personal history of chemotherapy 11/2018   PVD 04/07/2007   Tinea capitis    TRIGGER FINGER  05/06/2008   Unspecified vitamin D deficiency 08/06/2007    ALLERGIES:  is allergic to lisinopril; pepcid [famotidine]; prilosec [omeprazole]; and tums [calcium carbonate antacid].  MEDICATIONS:  Current Outpatient Medications  Medication Sig Dispense Refill   CALCIUM CITRATE-VITAMIN D3 PO Take by mouth. 630 mg of calcium and 13 mcg d3, take once daily     Cholecalciferol (D3 ADULT PO) Take 25 mcg by mouth daily.     Coenzyme Q10 (COQ10) 100 MG CAPS Take by mouth daily.     dexamethasone (DECADRON) 4 MG tablet Take 1-2 tablets (4-8 mg total) by mouth 2 (two) times daily as needed. 40 tablet 6   ELDERBERRY PO Take 50 mg by mouth daily.     HYDROcodone-acetaminophen (NORCO/VICODIN) 5-325 MG tablet TAKE ONE TABLET 4 TIMES A DAY AS INSTRUCTED 120 tablet 0   hydrocortisone cream 1 % Apply 1 application topically daily as needed for itching.     JULUCA 50-25 MG TABS TAKE 1 TABLET BY MOUTH DAILY WITH BREAKFAST. TAKE WITH THE PREZCOBIX. 30 tablet 5   levothyroxine (SYNTHROID) 200 MCG tablet Take 1 tablet (200 mcg total) by mouth daily before breakfast. 90 tablet 1   levothyroxine (SYNTHROID) 200 MCG tablet Take 1 tablet (200 mcg total) by mouth daily before breakfast. 90 tablet 1   lidocaine-prilocaine (EMLA) cream APPLY 1 APPLICATION TOPICALLY AS NEEDED. APPLY TO PORT SITE 1 HOUR PRIOR TO ACCESS 30 g 0   Loratadine 10 MG CAPS Take by mouth as needed.     Menthol, Topical Analgesic, (BENGAY EX) Apply 1 application topically daily as needed (muscle pain).     methocarbamol (ROBAXIN) 500 MG tablet Take 1 tablet (500 mg total) by mouth every 8 (eight) hours as needed for muscle spasms. 90 tablet 1   Methylsulfonylmethane (MSM) 1000 MG TABS Take by mouth daily.     Naphazoline-Glycerin (REDNESS RELIEF OP) Place 1 drop into both eyes daily as needed (redness).     olmesartan-hydrochlorothiazide (BENICAR HCT) 40-25 MG tablet TAKE 1 TABLET BY MOUTH DAILY. 30 tablet 1   Omega-3 Fatty  Acids (FISH OIL) 1000 MG CAPS Take by mouth daily.     potassium gluconate 595 (99 K) MG TABS tablet Take 595 mg by mouth daily.     PREZCOBIX 800-150 MG tablet TAKE 1 TABLET BY MOUTH DAILY. SWALLOW WHOLE. DO NOT CRUSH, BREAK OR CHEW TABLETS. TAKE WITH FOOD. 30 tablet 5   Probiotic Product (PROBIOTIC DAILY PO) Take by mouth daily.     prochlorperazine (COMPAZINE) 10 MG tablet Take 1 tablet (10 mg total) by mouth every 6 (six) hours as needed for nausea or vomiting. 30 tablet 4   SELZENTRY 150 MG tablet TAKE ONE TABLET BY MOUTH TWICE DAILY 60 tablet 5   Study - REPRIEVE [phone] - pitavastatin 4 mg or placebo tablet (PI-[place]) Take 1 tablet (4 mg total) by mouth daily. 30 tablet    tobramycin-dexamethasone (TOBRADEX) ophthalmic solution Place 2 drops into the right eye every 4 (four) hours while awake. 5 mL 0   Vitamin E 180 MG CAPS Take by mouth daily.     No current facility-administered medications for this visit.    Facility-Administered Medications Ordered in Other Visits  Medication Dose Route Frequency Provider Last Rate Last Dose   eriBULin  mesylate ([place]) 2.75 mg in sodium chloride 0.9 % 100 mL chemo infusion  1.2 mg/m2 (Treatment Plan Recorded) Intravenous Once Magrinat, Virgie Dad, MD       heparin lock flush 100 unit/mL  500 Units Intracatheter Once PRN Magrinat, Virgie Dad, MD       prochlorperazine (COMPAZINE) tablet 10 mg  10 mg Oral Once Magrinat, Virgie Dad, MD       sodium chloride flush (NS) 0.9 % injection 10 mL  10 mL Intracatheter PRN Magrinat, Virgie Dad, MD        SURGICAL HISTORY:  Past Surgical History:  Procedure Laterality Date   BREAST LUMPECTOMY Right    2005   CHEST TUBE INSERTION Right 05/30/2018   Procedure: INSERTION PLEURAL DRAINAGE CATHETER;  Surgeon: Grace Isaac, MD;  Location: [facility];  Service: Thoracic;  Laterality: Right;   COLONOSCOPY     COLONOSCOPY WITH ESOPHAGOGASTRODUODENOSCOPY (EGD)     ENDOBRONCHIAL ULTRASOUND Bilateral  05/15/2018   Procedure: ENDOBRONCHIAL ULTRASOUND;  Surgeon: Collene Gobble, MD;  [facility];  Service: Cardiopulmonary;  Laterality: Bilateral;   FINE NEEDLE ASPIRATION BIOPSY  05/15/2018   Procedure: FINE NEEDLE ASPIRATION BIOPSY;  Surgeon: Collene Gobble, MD;  Location: [facility];  Service: Cardiopulmonary;;   FLEXIBLE BRONCHOSCOPY  05/15/2018   Procedure: FLEXIBLE BRONCHOSCOPY;  Surgeon: Collene Gobble, MD;  [facility];  Service: Cardiopulmonary;;   HYSTEROSCOPY  2006   IR IMAGING GUIDED PORT INSERTION  07/14/2018   IR THORACENTESIS ASP PLEURAL SPACE W/IMG GUIDE  04/14/2018   IR THORACENTESIS ASP PLEURAL SPACE W/IMG GUIDE  04/28/2018   KNEE ARTHROSCOPY Left 2002   LYMPH NODE DISSECTION  2005   MASTECTOMY Right    MASTECTOMY MODIFIED RADICAL Right 11/05/2016   Procedure: RIGHT MASTECTOMY MODIFIED RADICAL;  Surgeon: Fanny Skates, MD;  Location: [facility];  Service: General;  Laterality: Right;   MODIFIED RADICAL MASTECTOMY Right 11/05/2016   placement   of port-a-cath     PLEURAL BIOPSY Right 05/30/2018   Procedure: PLEURAL BIOPSY;  Surgeon: Grace Isaac, MD;  Location: [facility];  Service: Thoracic;  Laterality: Right;   PLEURAL EFFUSION DRAINAGE Right 05/30/2018   Procedure: DRAINAGE OF PLEURAL EFFUSION;  Surgeon: Grace Isaac, MD;  Location: [facility];  Service: Thoracic;  Laterality: Right;   PORT-A-CATH REMOVAL  2006   insertion 2005   PORT-A-CATH REMOVAL N/A 03/29/2017   Procedure: REMOVAL PORT-A-CATH;  Surgeon: Fanny Skates, MD;  Location: [facility];  Service: General;  Laterality: N/A;   PORTACATH PLACEMENT Right 11/05/2016   Procedure: INSERTION PORT-A-CATH WITH ULTRA SOUND;  Surgeon: Fanny Skates, MD;  Location: [facility];  Service: General;  Laterality: Right;   removal of port a cath  12/2014   TALC PLEURODESIS Right 05/30/2018   Procedure: Pietro Cassis;  Surgeon: Grace Isaac, MD;  Location: [facility];  Service: Thoracic;   Laterality: Right;   THYROID SURGERY  2009   Ablation    TOTAL KNEE ARTHROPLASTY Left 02/06/2016   Procedure: TOTAL KNEE ARTHROPLASTY;  Surgeon: Frederik Pear, MD;  Location: [facility];  Service: Orthopedics;  Laterality: Left;   TUBAL LIGATION     VIDEO ASSISTED THORACOSCOPY Right 05/30/2018   Procedure: VIDEO ASSISTED THORACOSCOPY;  Surgeon: Grace Isaac, MD;  Location: [facility];  Service: Thoracic;  Laterality: Right;    GYNECOLOGIC HISTORY:  Patient's last menstrual period was 11/06/2003.  menarche age 27, first live birth age 59, the patient is [place] P2. She stopped having periods  in 2005, with her chemotherapy; she never took hormone replacement    SOCIAL HISTORY:  Mykela worked for the [employer] more than 20 years. 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 At home she lives with her daughter Orland Dec who works for [employer] and her granddaughter Donita Brooks, 69 y/o as of APRIL 2018.  The patient's son also drops infrequently.  They are all careful regarding pandemic precautions                          ADVANCED DIRECTIVES:  not in place   PHYSICAL EXAMINATION: Today's Vitals   03/06/19 1335  BP: 113/82  Pulse: 85  Resp: 17  Temp: 98.5 F (36.9 C)  TempSrc: Temporal  SpO2: 100%  Weight: 228 lb 12.8 oz (103.8 kg)  Height: _0  (1.702 m)   Body mass index is 35.84 kg/m. ECOG PERFORMANCE STATUS: 1 - Symptomatic but completely ambulatory GENERAL: Patient is a well appearing female in no acute distress HEENT:  Oropharynx clear and moist. No ulcerations or evidence of oropharyngeal candidiasis. Neck is supple.  NODES:  No cervical, supraclavicular, or axillary lymphadenopathy palpated.  BREAST EXAM:  Deferred. LUNGS:  Slightly diminished in right lower lobe, otherwise clear throughout.  No wheezes or rhonchi. HEART:  Regular rate and rhythm. No murmur appreciated. ABDOMEN:  Soft, nontender. normoactive bowel sounds. No organomegaly  palpated. MSK:  No focal spinal tenderness to palpation. Full range of motion bilaterally in the upper extremities. EXTREMITIES:  No peripheral edema.   SKIN:  Clear with no obvious rashes or skin changes. No nail dyscrasia. NEURO:  Nonfocal. Well oriented.  Appropriate affect.  LABORATORY DATA: Lab Results  Component Value Date   WBC 11.4 (H) 03/06/2019   HGB 11.8 (L) 03/06/2019   HCT 36.4 03/06/2019   MCV 95.3 03/06/2019   PLT 260 03/06/2019      Chemistry      Component Value Date/Time   NA 139 03/06/2019 1318   NA 140 05/13/2017 0927   K 3.7 03/06/2019 1318   K 4.1 05/13/2017 0927   CL 102 03/06/2019 1318   CO2 29 03/06/2019 1318   CO2 29 05/13/2017 0927   BUN 15 03/06/2019 1318   BUN 17.7 05/13/2017 0927   CREATININE 1.43 (H) 03/06/2019 1318   CREATININE 1.62 (H) 09/18/2018 0911   CREATININE 1.4 (H) 05/13/2017 0927   GLU 104 02/13/2016 1358      Component Value Date/Time   CALCIUM 10.3 03/06/2019 1318   CALCIUM 10.5 (H) 05/13/2017 0927   ALKPHOS 71 03/06/2019 1318   ALKPHOS 90 05/13/2017 0927   AST 35 03/06/2019 1318   AST 14 05/13/2017 0927   ALT 10 03/06/2019 1318   ALT 12 05/13/2017 0927   BILITOT 0.4 03/06/2019 1318   BILITOT 0.27 05/13/2017 0927       RADIOGRAPHIC STUDIES:  No results found.   ASSESSMENT: 63 y.o. [place] woman   (1) status post right lumpectomy and sentinel lymph node sampling October 2005 for a 0.6 cm invasive ductal carcinoma involving one out of 2 sentinel lymph nodes sampled, grade 3, triple-negative, treated adjuvantly with doxorubicin and cyclophosphamide 4 followed by weekly paclitaxel 7, followed by adjuvant radiation  RECURRENT DISEASE: (2) status post right breast upper outer quadrant biopsy and right axillary lymph node biopsy 10/01/2016, both positive for a T2 N1, stage IIIB invasive ductal carcinoma, triple negative, with an MIB-1 of 50-70%  (  3) status post right modified radical mastectomy 11/05/2016 showing  a pT2 pN1, stage IIIB invasive ductal carcinoma, grade 3, triple negative, with negative margins  (4) not a candidate for radiation given prior history  (5) adjuvant chemotherapy consisting of carboplatin and gemcitabine given days 1 and 8 of each 21 day cycle, for 6 cycles, starting 11/20/2016, completed 03/11/2017             (a) day 8 cycle 2 omitted because of neutropenia; Neupogen/Neulasta added  (5) HIV positivity: under care of Id Lucianne [place])  (6) Genetic testing 06/17/2017: no pathogenicmutations.Genes tested: APC, ATM, AXIN2, BARD1, BLM, BMPR1A, BRCA1, BRCA2, BRIP1, CDH1, CDK4, CDKN2A (p14ARF), CDKN2A (p16INK4a), CEBPA, CHEK2, CTNNA1, DICER1, EPCAM*, GATA2, GREM1*, HRAS, KIT, MEN1, MLH1, MSH2, MSH3, MSH6, MUTYH, NBN, NF1, PALB2, PDGFRA, PMS2, POLD1, POLE, PTEN, RAD50, RAD51C, RAD51D, RUNX1, SDHB, SDHC, SDHD, SMAD4, SMARCA4, STK11, TERC, TERT, TP53, TSC1, TSC2, VHL.The following genes were evaluated for sequence changes only: HOXB13*, NTHL1*, SDHA.              (a) A variant of uncertain significance (VUS)in a gene calledNTHL1was also noted.c.736G>A (p.Ala246Thr)  METASTATIC DISEASE: November 2019 (1) Patient seen in urgent care and ultimately ED on 10/21 for shortness of breath, chest xray demonstrated Right pleural effusion.   (a) right thoracenteses on 10/21 and 11/4 results show atypical cells, non diagnostic (b) CT chest 04/22/2018 shows re-accumulation of fluid, and right pleural nodularity. (c) PET scan on 05/01/2018 shows hypermetabolic pleural based metastases, right CP angle nodal metastases, no evidence of malignancy in abdomen and pelvis. (d) bronchoscopy with biopsy by Dr. Lamonte Sakai and BAL on 05/15/2018 was non diagnostic             (e) VAT biopsy of the right pleura 05/30/2018           confirms carcinoma, again triple negative             (f) [facility] shows PD-L1 positive (1% in [facility]); otherwise microsatellite stable, TMB low (5/Mb), no PIK3 mutations; other  mutations suggest sensitivity to MTOR inhibitors and several TKIs  (2) Atezolizumab started 07/15/2018 given every other week             (a) PET 08/13/2018 shows tumor Right hemithorax (new baseline study)             (b) Atezo changed to Q4w starting with 11/07/2018 dose             (c) PET 11/28/2018 documents progression in the right lung and pleural area as well as lymph nodes, and a T1 skeletal metastasis             (d) atezolizumab discontinued after 12/05/2018 dose  (3) eribulin day 1 and 8 of every 21 day cycle started 12/19/2018             (a) changed to every other week with Neulsta support due to neutropenia causing treatment delays and her h/o HIV  PLAN:  Estephanie is doing well today.  Her labs are stable.  She does not have any clinical signs of progression today.  She will proceed with Eribulin today.  I reviewed with her that I talked with her insurance company about her onpro, and they Dr. Berdie Ogren recommended that I place orders for [place] or Fulphila instead.  I did that and I reviewed this change with Kayla Price.    Keyshia's PET scan has not been scheduled.  I have asked my nurse Cecille Rubin to look into this.  Ranata will return for her injection, and then we will see her back on 9/17 for labs and f/u to review her PET scan.  She was recommended to continue with the appropriate pandemic precautions. She knows to call for any questions that may arise between now and her next appointment.  We are happy to see her sooner if needed.  A total of (20) minutes of face-to-face time was spent with this patient with greater than 50% of that time in counseling and care-coordination.   Wilber Bihari, NP 03/06/19

## 2019-03-07 LAB — CANCER ANTIGEN 27.29: CA 27.29: 39.2 U/mL — ABNORMAL HIGH (ref 0.0–38.6)

## 2019-03-09 ENCOUNTER — Other Ambulatory Visit: Payer: Self-pay | Admitting: Oncology

## 2019-03-09 ENCOUNTER — Telehealth: Payer: Self-pay

## 2019-03-09 LAB — QUANTIFERON-TB GOLD PLUS: QuantiFERON-TB Gold Plus: NEGATIVE

## 2019-03-09 LAB — HEMOGLOBIN A1C: Hemoglobin A1C: 6.1

## 2019-03-09 LAB — QUANTIFERON-TB GOLD PLUS (RQFGPL)
QuantiFERON Mitogen Value: >10 IU/mL
QuantiFERON Nil Value: 0.02 IU/mL
QuantiFERON TB1 Ag Value: 0.02 IU/mL
QuantiFERON TB2 Ag Value: 0.01 IU/mL

## 2019-03-09 MED FILL — SELZENTRY 150 MG TABS: 150 | 30 days supply | Qty: 60 | Fill #2

## 2019-03-09 NOTE — Telephone Encounter (Signed)
-----   Message from Gardenia Phlegm, NP sent at 03/09/2019  2:43 PM EDT ----- Please print two copies and mail to patient ----- Message ----- From: Buel Ream, Lab In [place] Sent: 03/09/2019   2:37 PM EDT To: Chauncey Cruel, MD

## 2019-03-09 NOTE — Telephone Encounter (Signed)
Copies mailed to patient.

## 2019-03-10 MED FILL — OLMESARTAN-HCTZ 40-25 MG TA: 40-25 | 30 days supply | Qty: 30 | Fill #0

## 2019-03-11 ENCOUNTER — Ambulatory Visit (HOSPITAL_COMMUNITY)
Admission: RE | Admit: 2019-03-11 | Discharge: 2019-03-11 | Disposition: A | Discharge status: Home / Self Care | Payer: Medicare HMO | Source: Ambulatory Visit | Attending: Adult Health | Admitting: Adult Health

## 2019-03-11 ENCOUNTER — Other Ambulatory Visit: Payer: Self-pay

## 2019-03-11 DIAGNOSIS — Z171 Estrogen receptor negative status [ER-]: Secondary | ICD-10-CM | POA: Diagnosis not present

## 2019-03-11 DIAGNOSIS — K8020 Calculus of gallbladder without cholecystitis without obstruction: Secondary | ICD-10-CM | POA: Diagnosis not present

## 2019-03-11 DIAGNOSIS — I129 Hypertensive chronic kidney disease with stage 1 through stage 4 chronic kidney disease, or unspecified chronic kidney disease: Secondary | ICD-10-CM | POA: Insufficient documentation

## 2019-03-11 DIAGNOSIS — K5730 Diverticulosis of large intestine without perforation or abscess without bleeding: Secondary | ICD-10-CM | POA: Insufficient documentation

## 2019-03-11 DIAGNOSIS — C7951 Secondary malignant neoplasm of bone: Secondary | ICD-10-CM | POA: Diagnosis not present

## 2019-03-11 DIAGNOSIS — C50411 Malignant neoplasm of upper-outer quadrant of right female breast: Secondary | ICD-10-CM | POA: Diagnosis not present

## 2019-03-11 DIAGNOSIS — C50919 Malignant neoplasm of unspecified site of unspecified female breast: Secondary | ICD-10-CM | POA: Diagnosis not present

## 2019-03-11 DIAGNOSIS — N183 Chronic kidney disease, stage 3 (moderate): Secondary | ICD-10-CM | POA: Diagnosis not present

## 2019-03-11 DIAGNOSIS — J320 Chronic maxillary sinusitis: Secondary | ICD-10-CM | POA: Diagnosis not present

## 2019-03-11 DIAGNOSIS — C7989 Secondary malignant neoplasm of other specified sites: Secondary | ICD-10-CM | POA: Insufficient documentation

## 2019-03-11 DIAGNOSIS — Z21 Asymptomatic human immunodeficiency virus [HIV] infection status: Secondary | ICD-10-CM | POA: Insufficient documentation

## 2019-03-11 DIAGNOSIS — E782 Mixed hyperlipidemia: Secondary | ICD-10-CM | POA: Insufficient documentation

## 2019-03-11 LAB — GLUCOSE, CAPILLARY: Glucose-Capillary: 93 mg/dL (ref 70–99)

## 2019-03-11 MED ORDER — FLUDEOXYGLUCOSE F - 18 (FDG) INJECTION
11.7000 | Freq: Once | INTRAVENOUS | Status: AC
  Administered 2019-03-11: 11.7 via INTRAVENOUS

## 2019-03-12 ENCOUNTER — Encounter: Payer: Medicare HMO | Admitting: Nurse Practitioner

## 2019-03-12 ENCOUNTER — Inpatient Hospital Stay (HOSPITAL_BASED_OUTPATIENT_CLINIC_OR_DEPARTMENT_OTHER): Payer: Medicare HMO | Admitting: Adult Health

## 2019-03-12 ENCOUNTER — Inpatient Hospital Stay: Payer: Medicare HMO

## 2019-03-12 ENCOUNTER — Ambulatory Visit: Payer: Medicare HMO | Admitting: Nurse Practitioner

## 2019-03-12 ENCOUNTER — Other Ambulatory Visit: Payer: Self-pay

## 2019-03-12 VITALS — BP 116/77 | HR 89 | Temp 98.9°F | Resp 17 | Ht 67.0 in | Wt 224.9 lb

## 2019-03-12 DIAGNOSIS — Z5111 Encounter for antineoplastic chemotherapy: Secondary | ICD-10-CM | POA: Diagnosis not present

## 2019-03-12 DIAGNOSIS — C7989 Secondary malignant neoplasm of other specified sites: Secondary | ICD-10-CM | POA: Diagnosis not present

## 2019-03-12 DIAGNOSIS — C782 Secondary malignant neoplasm of pleura: Secondary | ICD-10-CM | POA: Diagnosis not present

## 2019-03-12 DIAGNOSIS — Z170 Estrogen receptor positive status [ER+]: Secondary | ICD-10-CM | POA: Diagnosis not present

## 2019-03-12 DIAGNOSIS — C50411 Malignant neoplasm of upper-outer quadrant of right female breast: Secondary | ICD-10-CM

## 2019-03-12 DIAGNOSIS — Z171 Estrogen receptor negative status [ER-]: Secondary | ICD-10-CM

## 2019-03-12 DIAGNOSIS — Z21 Asymptomatic human immunodeficiency virus [HIV] infection status: Secondary | ICD-10-CM | POA: Diagnosis not present

## 2019-03-12 DIAGNOSIS — C770 Secondary and unspecified malignant neoplasm of lymph nodes of head, face and neck: Secondary | ICD-10-CM | POA: Diagnosis not present

## 2019-03-12 DIAGNOSIS — C773 Secondary and unspecified malignant neoplasm of axilla and upper limb lymph nodes: Secondary | ICD-10-CM | POA: Diagnosis not present

## 2019-03-12 DIAGNOSIS — C7951 Secondary malignant neoplasm of bone: Secondary | ICD-10-CM | POA: Diagnosis not present

## 2019-03-12 LAB — CMP (CANCER CENTER ONLY)
ALT: 14 U/L (ref 0–44)
AST: 35 U/L (ref 15–41)
Albumin: 3.8 g/dL (ref 3.5–5.0)
Alkaline Phosphatase: 55 U/L (ref 38–126)
Anion gap: 7 (ref 5–15)
BUN: 16 mg/dL (ref 8–23)
CO2: 31 mmol/L (ref 22–32)
Calcium: 11 mg/dL — ABNORMAL HIGH (ref 8.9–10.3)
Chloride: 104 mmol/L (ref 98–111)
Creatinine: 1.56 mg/dL — ABNORMAL HIGH (ref 0.44–1.00)
GFR, Est AFR Am: 41 mL/min — ABNORMAL LOW (ref >60)
GFR, Estimated: 35 mL/min — ABNORMAL LOW (ref >60)
Glucose, Bld: 101 mg/dL — ABNORMAL HIGH (ref 70–99)
Potassium: 4.3 mmol/L (ref 3.5–5.1)
Sodium: 142 mmol/L (ref 135–145)
Total Bilirubin: 0.5 mg/dL (ref 0.3–1.2)
Total Protein: 8.1 g/dL (ref 6.5–8.1)

## 2019-03-12 LAB — CBC WITH DIFFERENTIAL (CANCER CENTER ONLY)
Abs Immature Granulocytes: 0.05 10*3/uL (ref 0.00–0.07)
Basophils Absolute: 0.1 10*3/uL (ref 0.0–0.1)
Basophils Relative: 2 %
Eosinophils Absolute: 0.1 10*3/uL (ref 0.0–0.5)
Eosinophils Relative: 2 %
HCT: 36.3 % (ref 36.0–46.0)
Hemoglobin: 12 g/dL (ref 12.0–15.0)
Immature Granulocytes: 1 %
Lymphocytes Relative: 34 %
Lymphs Abs: 1.7 10*3/uL (ref 0.7–4.0)
MCH: 31 pg (ref 26.0–34.0)
MCHC: 33.1 g/dL (ref 30.0–36.0)
MCV: 93.8 fL (ref 80.0–100.0)
Monocytes Absolute: 0.5 10*3/uL (ref 0.1–1.0)
Monocytes Relative: 10 %
Neutro Abs: 2.5 10*3/uL (ref 1.7–7.7)
Neutrophils Relative %: 51 %
Platelet Count: 252 10*3/uL (ref 150–400)
RBC: 3.87 MIL/uL (ref 3.87–5.11)
RDW: 14.7 % (ref 11.5–15.5)
WBC Count: 5 10*3/uL (ref 4.0–10.5)
nRBC: 0 % (ref 0.0–0.2)

## 2019-03-12 NOTE — Patient Instructions (Signed)
Denosumab injection What is this medicine? DENOSUMAB (den oh sue mab) slows bone breakdown. Prolia is used to treat osteoporosis in women after menopause and in men, and in people who are taking corticosteroids for 6 months or more. Xgeva is used to treat a high calcium level due to cancer and to prevent bone fractures and other bone problems caused by multiple myeloma or cancer bone metastases. Xgeva is also used to treat giant cell tumor of the bone. This medicine may be used for other purposes; ask your health care provider or pharmacist if you have questions. COMMON BRAND NAME(S): Prolia, XGEVA What should I tell my health care provider before I take this medicine? They need to know if you have any of these conditions:  dental disease  having surgery or tooth extraction  infection  kidney disease  low levels of calcium or Vitamin D in the blood  malnutrition  on hemodialysis  skin conditions or sensitivity  thyroid or parathyroid disease  an unusual reaction to denosumab, other medicines, foods, dyes, or preservatives  pregnant or trying to get pregnant  breast-feeding How should I use this medicine? This medicine is for injection under the skin. It is given by a health care professional in a hospital or clinic setting. A special MedGuide will be given to you before each treatment. Be sure to read this information carefully each time. For Prolia, talk to your pediatrician regarding the use of this medicine in children. Special care may be needed. For Xgeva, talk to your pediatrician regarding the use of this medicine in children. While this drug may be prescribed for children as young as 13 years for selected conditions, precautions do apply. Overdosage: If you think you have taken too much of this medicine contact a poison control center or emergency room at once. NOTE: This medicine is only for you. Do not share this medicine with others. What if I miss a dose? It is  important not to miss your dose. Call your doctor or health care professional if you are unable to keep an appointment. What may interact with this medicine? Do not take this medicine with any of the following medications:  other medicines containing denosumab This medicine may also interact with the following medications:  medicines that lower your chance of fighting infection  steroid medicines like prednisone or cortisone This list may not describe all possible interactions. Give your health care provider a list of all the medicines, herbs, non-prescription drugs, or dietary supplements you use. Also tell them if you smoke, drink alcohol, or use illegal drugs. Some items may interact with your medicine. What should I watch for while using this medicine? Visit your doctor or health care professional for regular checks on your progress. Your doctor or health care professional may order blood tests and other tests to see how you are doing. Call your doctor or health care professional for advice if you get a fever, chills or sore throat, or other symptoms of a cold or flu. Do not treat yourself. This drug may decrease your body's ability to fight infection. Try to avoid being around people who are sick. You should make sure you get enough calcium and vitamin D while you are taking this medicine, unless your doctor tells you not to. Discuss the foods you eat and the vitamins you take with your health care professional. See your dentist regularly. Brush and floss your teeth as directed. Before you have any dental work done, tell your dentist you are   receiving this medicine. Do not become pregnant while taking this medicine or for 5 months after stopping it. Talk with your doctor or health care professional about your birth control options while taking this medicine. Women should inform their doctor if they wish to become pregnant or think they might be pregnant. There is a potential for serious side  effects to an unborn child. Talk to your health care professional or pharmacist for more information. What side effects may I notice from receiving this medicine? Side effects that you should report to your doctor or health care professional as soon as possible:  allergic reactions like skin rash, itching or hives, swelling of the face, lips, or tongue  bone pain  breathing problems  dizziness  jaw pain, especially after dental work  redness, blistering, peeling of the skin  signs and symptoms of infection like fever or chills; cough; sore throat; pain or trouble passing urine  signs of low calcium like fast heartbeat, muscle cramps or muscle pain; pain, tingling, numbness in the hands or feet; seizures  unusual bleeding or bruising  unusually weak or tired Side effects that usually do not require medical attention (report to your doctor or health care professional if they continue or are bothersome):  constipation  diarrhea  headache  joint pain  loss of appetite  muscle pain  runny nose  tiredness  upset stomach This list may not describe all possible side effects. Call your doctor for medical advice about side effects. You may report side effects to FDA at 1-800-FDA-1088. Where should I keep my medicine? This medicine is only given in a clinic, doctor's office, or other health care setting and will not be stored at home. NOTE: This sheet is a summary. It may not cover all possible information. If you have questions about this medicine, talk to your doctor, pharmacist, or health care provider.  2020 [employer] (2017-10-18 16:10:44)

## 2019-03-12 NOTE — Progress Notes (Signed)
[place]  [phone] [phone]) [phone]    ID: Kayla Price DOB: 12-Apr-1956  MR#: [id number]  XBW#:[id number]  Patient Care Team: Kayla Chandler, NP as PCP - General (Nurse Practitioner) Kayla Price, Kayla Islam, MD as PCP - Infectious Diseases (Infectious Diseases) Kayla Jacks, MD as Consulting Physician (Ophthalmology) Kayla Shin, MD as Consulting Physician (Endocrinology) Kayla Pear, MD as Consulting Physician (Orthopedic Surgery) Kayla Price, Kayla Dad, MD as Consulting Physician (Oncology) Kayla Skates, MD as Consulting Physician (General Surgery) Kayla Jude, MD as Consulting Physician (Obstetrics and Gynecology) Kayla Price, Kayla Massed, NP as Nurse Practitioner (Hematology and Oncology) Kayla Price, [place] as Referring Physician (Optometry) OTHER MD:   CHIEF COMPLAINT: Triple negative breast cancer, recurrent  CURRENT TREATMENT:  eribulin; to start [place]: [name] returns today for follow-up and treatment of her recurrent triple negative breast cancer.  She is receiving Eribulin given every two weeks.  Greydis notes that she is tolerating this well.  She is here for her seventh dose of treatment.    Since her last visit she underwent a PET scan to for restaging yesterday, on 9/16/20200.  This demonstrated per the impression, a mixed appearance with mild to moderate improvement in thoracic and upper abdominal adenopathy and soft tissue metastatic lesions, but new osseous lesions identified.  [name] has not started bisphosphanate therapy.  REVIEW OF SYSTEMS: [name] continues to do well.  She has some achiness, more in her shoulders bilaterally.  She takes Norco when needed, but wonders if there is something else she might take.  She has some constipation, but manages this with stool softeners and her last bowel movement was yesterday.  Thomasina has lost some more weight and is now down 4 pounds.  She  denies a decreased appetite, nausea, dysphagia, or any new pain.  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  She continues to have some stress related to this.  She denies any new fever, chills, chest pain, palpitations, cough, or shortness of breath.  She has no nausea, vomiting, skin lesion/rash, mucositis, bladder concerns.  She denies peripheral neuropathy.    BREAST CANCER HISTORY: From the original intake note:  Ruwayda is a history of right-sided breast cancer dating back to 2005. She had a lumpectomy with sentinel lymph node sampling, chemotherapy, and radiation. I do not have access to those records at present.  More recently she had bilateral screening mammography at [facility] 09/25/2016 showing a possible mass in the right breast. Diagnostic mammography with ultrasonography on 09/28/2016 the patient underwent right diagnostic mammography with tomography and right breast ultrasonography. The breast density was category A. In the right breast at the 10:00 position there was an irregular mass measuring 2.5 cm. Ultrasound identified this the 10:00 radiant 10 cm from the nipple measuring 2.4 cm. In the right axilla there was an abnormal lymph node measuring 1.3 cm with other normal-appearing lymph nodes.  On 10/01/2016 she  underwent biopsy of the right breast mass in question as well as the suspicious axillary lymph node. 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 The proliferation marker was 70% in the breast lesion and 50% in the lymph node.  Her subsequent history is as detailed below.   MEDICAL HISTORY: Past Medical History:  Diagnosis Date   Alopecia areata 11/28/2009   Bell's palsy  Cancer [facility]) 2005   Breast cancer   chemotherapy and radiation   CKD (chronic kidney  disease) stage 3, GFR 30-59 ml/min (HCC) 06/08/2015   Dry eye syndrome    Family history of lung cancer    Family history of non-Hodgkin's lymphoma    Fasting hyperglycemia    Gestational diabetes    2001   HIP FRACTURE, RIGHT 05/06/2008   History of kidney stones    HIV DISEASE 03/27/2006   HYPERLIPIDEMIA, MIXED 12/15/2007   HYPERTENSION 03/27/2006   HYPOTHYROIDISM, POST-RADIATION 06/28/2008   MENORRHAGIA, POSTMENOPAUSAL 02/03/2009   Osteoarthritis of left knee 06/08/2015   OSTEOARTHROSIS, LOCAL, SCND, UNSPC SITE 04/07/2007   Personal history of chemotherapy 11/2018   PVD 04/07/2007   Tinea capitis    TRIGGER FINGER 05/06/2008   Unspecified vitamin D deficiency 08/06/2007    ALLERGIES:  is allergic to lisinopril; pepcid [famotidine]; prilosec [omeprazole]; and tums [calcium carbonate antacid].  MEDICATIONS:  Current Outpatient Medications  Medication Sig Dispense Refill   CALCIUM CITRATE-VITAMIN D3 PO Take by mouth. 630 mg of calcium and 13 mcg d3, take once daily     Cholecalciferol (D3 ADULT PO) Take 25 mcg by mouth daily.     Coenzyme Q10 (COQ10) 100 MG CAPS Take by mouth daily.     dexamethasone (DECADRON) 4 MG tablet Take 1-2 tablets (4-8 mg total) by mouth 2 (two) times daily as needed. 40 tablet 6   ELDERBERRY PO Take 50 mg by mouth daily.     HYDROcodone-acetaminophen (NORCO/VICODIN) 5-325 MG tablet TAKE ONE TABLET 4 TIMES A DAY AS INSTRUCTED 120 tablet 0   hydrocortisone cream 1 % Apply 1 application topically daily as needed for itching.     JULUCA 50-25 MG TABS TAKE 1 TABLET BY MOUTH DAILY WITH BREAKFAST. TAKE WITH THE PREZCOBIX. 30 tablet 5   levothyroxine (SYNTHROID) 200 MCG tablet Take 1 tablet (200 mcg total) by mouth daily before breakfast. 90 tablet 1   levothyroxine (SYNTHROID) 200 MCG tablet Take 1 tablet (200 mcg total) by mouth daily before breakfast. 90 tablet 1   lidocaine-prilocaine (EMLA) cream APPLY 1 APPLICATION TOPICALLY AS  NEEDED. APPLY TO PORT SITE 1 HOUR PRIOR TO ACCESS 30 g 0   Loratadine 10 MG CAPS Take by mouth as needed.     Menthol, Topical Analgesic, (BENGAY EX) Apply 1 application topically daily as needed (muscle pain).     methocarbamol (ROBAXIN) 500 MG tablet Take 1 tablet (500 mg total) by mouth every 8 (eight) hours as needed for muscle spasms. 90 tablet 1   Methylsulfonylmethane (MSM) 1000 MG TABS Take by mouth daily.     Naphazoline-Glycerin (REDNESS RELIEF OP) Place 1 drop into both eyes daily as needed (redness).     olmesartan-hydrochlorothiazide (BENICAR HCT) 40-25 MG tablet TAKE 1 TABLET BY MOUTH DAILY. 30 tablet 1   Omega-3 Fatty Acids (FISH OIL) 1000 MG CAPS Take by mouth daily.     potassium gluconate 595 (99 K) MG TABS tablet Take 595 mg by mouth daily.     PREZCOBIX 800-150 MG tablet TAKE 1 TABLET BY MOUTH DAILY. SWALLOW WHOLE. DO NOT CRUSH, BREAK OR CHEW TABLETS. TAKE WITH FOOD. 30 tablet 5   Probiotic Product (PROBIOTIC DAILY PO) Take by mouth daily.     prochlorperazine (COMPAZINE) 10 MG tablet Take 1 tablet (10 mg total) by mouth every 6 (six) hours as needed for nausea or vomiting. 30 tablet 4   SELZENTRY 150 MG tablet TAKE ONE TABLET BY MOUTH TWICE DAILY  60 tablet 5   Study - REPRIEVE [phone] - pitavastatin 4 mg or placebo tablet ([place]) Take 1 tablet (4 mg total) by mouth daily. 30 tablet    tobramycin-dexamethasone (TOBRADEX) ophthalmic solution Place 2 drops into the right eye every 4 (four) hours while awake. 5 mL 0   Vitamin E 180 MG CAPS Take by mouth daily.     No current facility-administered medications for this visit.     SURGICAL HISTORY:  Past Surgical History:  Procedure Laterality Date   BREAST LUMPECTOMY Right    2005   CHEST TUBE INSERTION Right 05/30/2018   Procedure: INSERTION PLEURAL DRAINAGE CATHETER;  Surgeon: Grace Isaac, MD;  Location: [facility];  Service: Thoracic;  Laterality: Right;   COLONOSCOPY     COLONOSCOPY WITH  ESOPHAGOGASTRODUODENOSCOPY (EGD)     ENDOBRONCHIAL ULTRASOUND Bilateral 05/15/2018   Procedure: ENDOBRONCHIAL ULTRASOUND;  Surgeon: Collene Gobble, MD;  Location: [facility];  Service: Cardiopulmonary;  Laterality: Bilateral;   FINE NEEDLE ASPIRATION BIOPSY  05/15/2018   Procedure: FINE NEEDLE ASPIRATION BIOPSY;  Surgeon: Collene Gobble, MD;  Location: [facility];  Service: Cardiopulmonary;;   FLEXIBLE BRONCHOSCOPY  05/15/2018   Procedure: FLEXIBLE BRONCHOSCOPY;  Surgeon: Collene Gobble, MD;  Location: [facility];  Service: Cardiopulmonary;;   HYSTEROSCOPY  2006   IR IMAGING GUIDED PORT INSERTION  07/14/2018   IR THORACENTESIS ASP PLEURAL SPACE W/IMG GUIDE  04/14/2018   IR THORACENTESIS ASP PLEURAL SPACE W/IMG GUIDE  04/28/2018   KNEE ARTHROSCOPY Left 2002   LYMPH NODE DISSECTION  2005   MASTECTOMY Right    MASTECTOMY MODIFIED RADICAL Right 11/05/2016   Procedure: RIGHT MASTECTOMY MODIFIED RADICAL;  Surgeon: Kayla Skates, MD;  Location: [place];  Service: General;  Laterality: Right;   MODIFIED RADICAL MASTECTOMY Right 11/05/2016   placement   of port-a-cath     PLEURAL BIOPSY Right 05/30/2018   Procedure: PLEURAL BIOPSY;  Surgeon: Grace Isaac, MD;  Location: [place];  Service: Thoracic;  Laterality: Right;   PLEURAL EFFUSION DRAINAGE Right 05/30/2018   Procedure: DRAINAGE OF PLEURAL EFFUSION;  Surgeon: Grace Isaac, MD;  Location: [place];  Service: Thoracic;  Laterality: Right;   PORT-A-CATH REMOVAL  2006   insertion 2005   PORT-A-CATH REMOVAL N/A 03/29/2017   Procedure: REMOVAL PORT-A-CATH;  Surgeon: Kayla Skates, MD;  Location: [place];  Service: General;  Laterality: N/A;   PORTACATH PLACEMENT Right 11/05/2016   Procedure: INSERTION PORT-A-CATH WITH ULTRA SOUND;  Surgeon: Kayla Skates, MD;  Location: [place];  Service: General;  Laterality: Right;   removal of port a cath  12/2014   TALC PLEURODESIS Right 05/30/2018   Procedure: Pietro Cassis;   Surgeon: Grace Isaac, MD;  Location: [place];  Service: Thoracic;  Laterality: Right;   THYROID SURGERY  2009   Ablation    TOTAL KNEE ARTHROPLASTY Left 02/06/2016   Procedure: TOTAL KNEE ARTHROPLASTY;  Surgeon: Kayla Pear, MD;  Location: [place];  Service: Orthopedics;  Laterality: Left;   TUBAL LIGATION     VIDEO ASSISTED THORACOSCOPY Right 05/30/2018   Procedure: VIDEO ASSISTED THORACOSCOPY;  Surgeon: Grace Isaac, MD;  Location: [place];  Service: Thoracic;  Laterality: Right;    GYNECOLOGIC HISTORY:  Patient's last menstrual period was 11/06/2003.  menarche age 24, first live birth age 47, the patient is [place] P2. 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 At home she lives with her daughter Orland Dec who works for [employer] and her granddaughter Donita Brooks, 66 y/o as of APRIL 2018.  The patient's son also drops infrequently.  They are all careful regarding pandemic precautions                          ADVANCED DIRECTIVES:  not in place   PHYSICAL EXAMINATION: Today's Vitals   03/12/19 1315  BP: 116/77  Pulse: 89  Resp: 17  Temp: 98.9 F (37.2 C)  TempSrc: Oral  SpO2: 96%  Weight: 224 lb 14.4 oz (102 kg)  Height: _0  (1.702 m)   Body mass index is 35.22 kg/m. ECOG PERFORMANCE STATUS: 1 - Symptomatic but completely ambulatory GENERAL: Patient is a well appearing female in no acute distress HEENT:  Oropharynx clear and moist. No ulcerations or evidence of oropharyngeal candidiasis. Neck is supple.  NODES:  No cervical, supraclavicular, or axillary lymphadenopathy palpated.  BREAST EXAM:  Deferred. LUNGS:  Slightly diminished in right lower lobe, otherwise clear throughout.  No wheezes or rhonchi. HEART:  Regular rate and rhythm. No murmur  appreciated. ABDOMEN:  Soft, nontender. normoactive bowel sounds. No organomegaly palpated. MSK:  No focal spinal tenderness to palpation. Full range of motion bilaterally in the upper extremities. EXTREMITIES:  No peripheral edema.   SKIN:  Clear with no obvious rashes or skin changes. No nail dyscrasia. NEURO:  Nonfocal. Well oriented.  Appropriate affect.  LABORATORY DATA: Lab Results  Component Value Date   WBC 5.0 03/12/2019   HGB 12.0 03/12/2019   HCT 36.3 03/12/2019   MCV 93.8 03/12/2019   PLT 252 03/12/2019      Chemistry      Component Value Date/Time   NA 139 03/06/2019 1318   NA 140 05/13/2017 0927   K 3.7 03/06/2019 1318   K 4.1 05/13/2017 0927   CL 102 03/06/2019 1318   CO2 29 03/06/2019 1318   CO2 29 05/13/2017 0927   BUN 15 03/06/2019 1318   BUN 17.7 05/13/2017 0927   CREATININE 1.43 (H) 03/06/2019 1318   CREATININE 1.62 (H) 09/18/2018 0911   CREATININE 1.4 (H) 05/13/2017 0927   GLU 104 02/13/2016 1358      Component Value Date/Time   CALCIUM 10.3 03/06/2019 1318   CALCIUM 10.5 (H) 05/13/2017 0927   ALKPHOS 71 03/06/2019 1318   ALKPHOS 90 05/13/2017 0927   AST 35 03/06/2019 1318   AST 14 05/13/2017 0927   ALT 10 03/06/2019 1318   ALT 12 05/13/2017 0927   BILITOT 0.4 03/06/2019 1318   BILITOT 0.27 05/13/2017 0927       RADIOGRAPHIC STUDIES:  Nm Pet Image Restag (ps) Skull Base To Thigh  Result Date: 03/12/2019 CLINICAL DATA:  Subsequent treatment strategy for metastatic breast cancer. EXAM: NUCLEAR MEDICINE PET SKULL BASE TO THIGH TECHNIQUE: 11.7 mCi F-18 FDG was injected intravenously. Full-ring PET imaging was performed from the skull base to thigh after the radiotracer. CT data was obtained and used for attenuation correction and anatomic localization. Fasting blood glucose: 93 mg/dl COMPARISON:  Multiple exams, including 11/28/2018 FINDINGS: Mediastinal blood pool activity: SUV max 2.0 Liver activity: SUV max 2.7 NECK: No significant abnormal  hypermetabolic activity in this region. Incidental CT findings: Mucous retention cyst in the left maxillary sinus. CHEST: Overall mild improvement in adenopathy and pleural  tumor compared to prior. A lower paratracheal node anterior to the carina measures 1.6 cm in short axis on image 60/4 (formerly 2.6 cm) with maximum SUV 7.6 (formerly 15.6). The right middle lobe mass is primarily photopenic today, with loss of much of the peripheral activity along this mass. It is difficult to measure the signal intensity of this mass separate from the surrounding pleural metastatic rind, but posterior portions the mass maximum SUV of about 9.3, formerly 22.4. The centrally necrotic portion of the mass is hypodense and measures about 3.8 cm in diameter, formerly 3.1 cm, indicating increased central necrosis. The pleural rind tumor demonstrates reduced thickness and reduced metabolic activity. Posterior pleural tumor just below the level of the carina has maximum SUV of 10.9, formerly 14.7. Although the trend is generally improvement, there are several lymph nodes which are small but have increased metabolic activity compared to the prior exam. For example, a pericardial lymph node measuring 0.7 cm in short axis on image 62/4 (formerly the same) has a maximum SUV of 6.0 today, previously 4.6. A prevascular node measuring 1.1 cm in short axis on image 55/4 (formerly 0.9 cm) has a maximum SUV of 6.3, formerly 3.7. Incidental CT findings: Right Port-A-Cath tip: Right atrium. Atherosclerotic aortic arch. Moderate cardiomegaly. ABDOMEN/PELVIS: Previous gastrohepatic ligament adenopathy is indistinct today but measures about 1.0 cm in short axis on image 94/4 (formerly 1.7 cm) with maximum SUV 1.8 (formerly 9.2). A previously mildly hypermetabolic lymph node near the apex of the left adrenal gland is no longer readily apparent. There continues to be some mildly hypermetabolic retroperitoneal adenopathy, with a left para-aortic lymph  node measuring 0.9 cm in short axis on image 114/4 (formerly 0.9 cm) with maximum SUV 9.7 (formerly 12.8). Incidental CT findings: Small dependent gallstones in the gallbladder. Mild sigmoid colon diverticulosis. SKELETON: Progressive hypermetabolic activity and progressive lytic lesions eccentric to the right in the T1 vertebral body and posterior elements, maximum SUV 8.9, previously 7.2. New lytic metastatic lesions including in the right skull base just lateral to the foramen magnum; at L5; in both iliac bones; and in both femoral heads. The left eccentric lesion in the L5 vertebral body has maximum standard uptake value of 8.8; the right femoral head lesion has maximum SUV of 10.9. Incidental CT findings: Grade 1 anterolisthesis at L4-5 and L5-S1. IMPRESSION: 1. Mixed appearance, with generalized mild to moderate improvement in thoracic and upper abdominal adenopathy and soft tissue metastatic lesions including the right pleural space; but with multiple new osseous metastatic lesions identified. 2. Other imaging findings of potential clinical significance: Search after thorough moderate cardiomegaly. Chronic left maxillary sinusitis. Cholelithiasis. Sigmoid colon diverticulosis. Electronically Signed   By: Van Clines M.D.   On: 03/12/2019 07:57     ASSESSMENT: 63 y.o. [place] woman   (1) status post right lumpectomy and sentinel lymph node sampling October 2005 for a 0.6 cm invasive ductal carcinoma involving one out of 2 sentinel lymph nodes sampled, grade 3, triple-negative, treated adjuvantly with doxorubicin and cyclophosphamide 4 followed by weekly paclitaxel 7, followed by adjuvant radiation  RECURRENT DISEASE: (2) status post right breast upper outer quadrant biopsy and right axillary lymph node biopsy 10/01/2016, both positive for a T2 N1, stage IIIB invasive ductal carcinoma, triple negative, with an MIB-1 of 50-70%  (3) status post right modified radical mastectomy  11/05/2016 showing a pT2 pN1, stage IIIB invasive ductal carcinoma, grade 3, triple negative, with negative margins  (4) not a candidate for radiation given prior history  (  5) adjuvant chemotherapy consisting of carboplatin and gemcitabine given days 1 and 8 of each 21 day cycle, for 6 cycles, starting 11/20/2016, completed 03/11/2017             (a) day 8 cycle 2 omitted because of neutropenia; Neupogen/Neulasta added  (5) HIV positivity: under care of Id Lucianne [place])  (6) Genetic testing 06/17/2017: no pathogenicmutations.Genes tested: APC, ATM, AXIN2, BARD1, BLM, BMPR1A, BRCA1, BRCA2, BRIP1, CDH1, CDK4, CDKN2A (p14ARF), CDKN2A (p16INK4a), CEBPA, CHEK2, CTNNA1, DICER1, EPCAM*, GATA2, GREM1*, HRAS, KIT, MEN1, MLH1, MSH2, MSH3, MSH6, MUTYH, NBN, NF1, PALB2, PDGFRA, PMS2, POLD1, POLE, PTEN, RAD50, RAD51C, RAD51D, RUNX1, SDHB, SDHC, SDHD, SMAD4, SMARCA4, STK11, TERC, TERT, TP53, TSC1, TSC2, VHL.The following genes were evaluated for sequence changes only: HOXB13*, NTHL1*, SDHA.              (a) A variant of uncertain significance (VUS)in a gene calledNTHL1was also noted.c.[id number] (p.[id number])  METASTATIC DISEASE: November 2019 (1) Patient seen in urgent care and ultimately ED on 10/21 for shortness of breath, chest xray demonstrated Right pleural effusion.   (a) right thoracenteses on 10/21 and 11/4 results show atypical cells, non diagnostic (b) CT chest 04/22/2018 shows re-accumulation of fluid, and right pleural nodularity. (c) PET scan on 05/01/2018 shows hypermetabolic pleural based metastases, right CP angle nodal metastases, no evidence of malignancy in abdomen and pelvis. (d) bronchoscopy with biopsy by Dr. Lamonte Sakai and BAL on 05/15/2018 was non diagnostic             (e) VAT biopsy of the right pleura 05/30/2018           confirms carcinoma, again triple negative             (f) [facility] shows PD-L1 positive (1% in [facility]); otherwise microsatellite stable, TMB low (5/Mb), no PIK3  mutations; other mutations suggest sensitivity to MTOR inhibitors and several TKIs  (2) Atezolizumab started 07/15/2018 given every other week             (a) PET 08/13/2018 shows tumor Right hemithorax (new baseline study)             (b) Atezo changed to Q4w starting with 11/07/2018 dose             (c) PET 11/28/2018 documents progression in the right lung and pleural area as well as lymph nodes, and a T1 skeletal metastasis             (d) atezolizumab discontinued after 12/05/2018 dose  (3) eribulin day 1 and 8 of every 21 day cycle started 12/19/2018             (a) changed to every other week with Neulsta support due to neutropenia causing treatment delays and her h/o HIV (insurance denied Neulasta support or any biosimilar starting with cycle 6)  (b) Xgeva to be given every 4 weeks starting on 04/03/2019  PLAN:  Laylee is doing well today. I reviewed her PET scan with her.  The thoracic adenopathy and soft tissue metastasis is improved, this is good news.  She does have some increasing osseous lesions.  She has not yet been started on bisphosphanate therapy with Xgeva.  We talked about this.  She will start Xgeva in October.  We reviewed risks and benefits in detail.  She understands rare black box warning of osteonecrosis of the jaw, and the need to let us know if she has any dental/oral issues, and to have dental evaluation prior to starting Xgeva.  We  also reviewed the potential for hypocalcemia, and the recommendation to take extra calcium on injection day.  I will reach out to her infectious disease physician, Dr. Tommy Price, to get some guidance on what she can take with the Rilpivirine, as we typically recommend Tums, however this is contraindicated for her.    Aalaysia will continue on Eribulin given every 2 weeks.  She is tolerating this well.  She will be restaged after 6 doses, or sooner, if she develops clinical concerns for progression.  Epifania and I reviewed her weight and  dietary recommendations.    Shadiyah is in agreement with the above plan.  I reviewed it with Dr. Jana Hakim who also agrees.    Shannah will return next week for labs and her Eribulin.  We will not see her with this dose, but she knows to reach out if she has any questions before her appointment.  We will see her back on 10/9 prior to her Eribulin and Xgeva.  She was recommended to continue with the appropriate pandemic precautions. She knows to call for any questions that may arise between now and her next appointment.  We are happy to see her sooner if needed.   A total of (30) minutes of face-to-face time was spent with this patient with greater than 50% of that time in counseling and care-coordination.   Wilber Bihari, NP 03/12/19

## 2019-03-13 ENCOUNTER — Encounter: Payer: Self-pay | Admitting: Adult Health

## 2019-03-13 ENCOUNTER — Ambulatory Visit: Payer: Medicare HMO | Admitting: Endocrinology

## 2019-03-13 ENCOUNTER — Ambulatory Visit (HOSPITAL_COMMUNITY): Admission: RE | Admit: 2019-03-13 | Payer: Medicare HMO | Source: Ambulatory Visit

## 2019-03-13 ENCOUNTER — Telehealth: Payer: Self-pay | Admitting: Adult Health

## 2019-03-13 LAB — CANCER ANTIGEN 27.29: CA 27.29: 50.4 U/mL — ABNORMAL HIGH (ref 0.0–38.6)

## 2019-03-13 NOTE — Telephone Encounter (Signed)
I talk with patient regarding schedule  

## 2019-03-17 ENCOUNTER — Other Ambulatory Visit: Payer: Self-pay | Admitting: Oncology

## 2019-03-17 DIAGNOSIS — M1990 Unspecified osteoarthritis, unspecified site: Secondary | ICD-10-CM

## 2019-03-18 ENCOUNTER — Encounter (INDEPENDENT_AMBULATORY_CARE_PROVIDER_SITE_OTHER): Payer: Medicare HMO | Admitting: *Deleted

## 2019-03-18 ENCOUNTER — Other Ambulatory Visit: Payer: Self-pay

## 2019-03-18 VITALS — BP 125/78 | HR 81 | Temp 98.3°F

## 2019-03-18 DIAGNOSIS — Z006 Encounter for examination for normal comparison and control in clinical research program: Secondary | ICD-10-CM

## 2019-03-18 MED FILL — HYDROCODON-APAP 5-325: 5-325 | 30 days supply | Qty: 120 | Fill #0

## 2019-03-18 NOTE — Research (Signed)
Kayla Price was here for her month 24 visit for Reprieve, A Randomized Trial to Prevent Vascular Events in HIV (study drug is Pitavastatin 4mg  or placebo).  She has been losing weight recently and doesn't seem to be able to eat much, even though she has an appetite. Her recent PET scan showed some improvement with the pleural tumor, but skeletal lesions have shown up in different areas of her body. She continues to get chemo and is tolerating that well.She is very adherent with her study meds and will be returning in January for the next visit.

## 2019-03-20 ENCOUNTER — Telehealth: Payer: Self-pay | Admitting: *Deleted

## 2019-03-20 ENCOUNTER — Inpatient Hospital Stay: Payer: Medicare HMO

## 2019-03-20 ENCOUNTER — Other Ambulatory Visit: Payer: Self-pay | Admitting: Oncology

## 2019-03-20 ENCOUNTER — Other Ambulatory Visit: Payer: Self-pay

## 2019-03-20 ENCOUNTER — Telehealth: Payer: Self-pay | Admitting: Oncology

## 2019-03-20 VITALS — BP 116/78 | HR 85 | Temp 98.2°F | Resp 16

## 2019-03-20 DIAGNOSIS — C770 Secondary and unspecified malignant neoplasm of lymph nodes of head, face and neck: Secondary | ICD-10-CM | POA: Diagnosis not present

## 2019-03-20 DIAGNOSIS — C50411 Malignant neoplasm of upper-outer quadrant of right female breast: Secondary | ICD-10-CM

## 2019-03-20 DIAGNOSIS — Z171 Estrogen receptor negative status [ER-]: Secondary | ICD-10-CM

## 2019-03-20 DIAGNOSIS — Z170 Estrogen receptor positive status [ER+]: Secondary | ICD-10-CM | POA: Diagnosis not present

## 2019-03-20 DIAGNOSIS — C7951 Secondary malignant neoplasm of bone: Secondary | ICD-10-CM | POA: Diagnosis not present

## 2019-03-20 DIAGNOSIS — C773 Secondary and unspecified malignant neoplasm of axilla and upper limb lymph nodes: Secondary | ICD-10-CM | POA: Diagnosis not present

## 2019-03-20 DIAGNOSIS — Z95828 Presence of other vascular implants and grafts: Secondary | ICD-10-CM

## 2019-03-20 DIAGNOSIS — Z21 Asymptomatic human immunodeficiency virus [HIV] infection status: Secondary | ICD-10-CM | POA: Diagnosis not present

## 2019-03-20 DIAGNOSIS — Z5111 Encounter for antineoplastic chemotherapy: Secondary | ICD-10-CM | POA: Diagnosis not present

## 2019-03-20 DIAGNOSIS — C7989 Secondary malignant neoplasm of other specified sites: Secondary | ICD-10-CM | POA: Diagnosis not present

## 2019-03-20 DIAGNOSIS — C782 Secondary malignant neoplasm of pleura: Secondary | ICD-10-CM | POA: Diagnosis not present

## 2019-03-20 LAB — CBC WITH DIFFERENTIAL (CANCER CENTER ONLY)
Abs Immature Granulocytes: 0.01 10*3/uL (ref 0.00–0.07)
Basophils Absolute: 0.1 10*3/uL (ref 0.0–0.1)
Basophils Relative: 2 %
Eosinophils Absolute: 0.4 10*3/uL (ref 0.0–0.5)
Eosinophils Relative: 10 %
HCT: 34.1 % — ABNORMAL LOW (ref 36.0–46.0)
Hemoglobin: 11.2 g/dL — ABNORMAL LOW (ref 12.0–15.0)
Immature Granulocytes: 0 %
Lymphocytes Relative: 36 %
Lymphs Abs: 1.2 10*3/uL (ref 0.7–4.0)
MCH: 31.3 pg (ref 26.0–34.0)
MCHC: 32.8 g/dL (ref 30.0–36.0)
MCV: 95.3 fL (ref 80.0–100.0)
Monocytes Absolute: 0.8 10*3/uL (ref 0.1–1.0)
Monocytes Relative: 23 %
Neutro Abs: 1 10*3/uL — ABNORMAL LOW (ref 1.7–7.7)
Neutrophils Relative %: 29 %
Platelet Count: 282 10*3/uL (ref 150–400)
RBC: 3.58 MIL/uL — ABNORMAL LOW (ref 3.87–5.11)
RDW: 15.1 % (ref 11.5–15.5)
WBC Count: 3.5 10*3/uL — ABNORMAL LOW (ref 4.0–10.5)
nRBC: 0 % (ref 0.0–0.2)

## 2019-03-20 LAB — CMP (CANCER CENTER ONLY)
ALT: 11 U/L (ref 0–44)
AST: 37 U/L (ref 15–41)
Albumin: 3.8 g/dL (ref 3.5–5.0)
Alkaline Phosphatase: 55 U/L (ref 38–126)
Anion gap: 9 (ref 5–15)
BUN: 15 mg/dL (ref 8–23)
CO2: 28 mmol/L (ref 22–32)
Calcium: 10.3 mg/dL (ref 8.9–10.3)
Chloride: 103 mmol/L (ref 98–111)
Creatinine: 1.3 mg/dL — ABNORMAL HIGH (ref 0.44–1.00)
GFR, Est AFR Am: 51 mL/min — ABNORMAL LOW (ref >60)
GFR, Estimated: 44 mL/min — ABNORMAL LOW (ref >60)
Glucose, Bld: 86 mg/dL (ref 70–99)
Potassium: 3.6 mmol/L (ref 3.5–5.1)
Sodium: 140 mmol/L (ref 135–145)
Total Bilirubin: 0.4 mg/dL (ref 0.3–1.2)
Total Protein: 7.8 g/dL (ref 6.5–8.1)

## 2019-03-20 MED ORDER — SODIUM CHLORIDE 0.9% FLUSH
10.0000 mL | Freq: Once | INTRAVENOUS | Status: AC
  Administered 2019-03-20: 13:00:00 10 mL
  Filled 2019-03-20: qty 10

## 2019-03-20 MED ORDER — SODIUM CHLORIDE 0.9% FLUSH
10.0000 mL | Freq: Once | INTRAVENOUS | Status: DC
  Filled 2019-03-20: qty 10

## 2019-03-20 MED ORDER — HEPARIN SOD (PORK) LOCK FLUSH 100 UNIT/ML IV SOLN
500.0000 [IU] | Freq: Once | INTRAVENOUS | Status: DC
  Filled 2019-03-20: qty 5

## 2019-03-20 NOTE — Patient Instructions (Signed)

## 2019-03-20 NOTE — Telephone Encounter (Signed)
Confirmed with patient 9/30 appointment.

## 2019-03-20 NOTE — Progress Notes (Signed)
Pt did not get treatment today d/t need for PA for OnPro / neulasta.

## 2019-03-21 LAB — CANCER ANTIGEN 27.29: CA 27.29: 45.9 U/mL — ABNORMAL HIGH (ref 0.0–38.6)

## 2019-03-24 ENCOUNTER — Other Ambulatory Visit: Payer: Self-pay | Admitting: Adult Health

## 2019-03-24 ENCOUNTER — Other Ambulatory Visit: Payer: Self-pay | Admitting: Oncology

## 2019-03-24 MED FILL — PREZCOBIX 800 MG-150 MG TAB: 800-150 | 30 days supply | Qty: 30 | Fill #5

## 2019-03-24 MED FILL — JULUCA 50-25 MG TAB: 50-25 | 30 days supply | Qty: 30 | Fill #5

## 2019-03-25 ENCOUNTER — Inpatient Hospital Stay: Payer: Medicare HMO

## 2019-03-25 ENCOUNTER — Other Ambulatory Visit: Payer: Self-pay

## 2019-03-25 ENCOUNTER — Other Ambulatory Visit: Payer: Self-pay | Admitting: Oncology

## 2019-03-25 VITALS — BP 115/79 | HR 79 | Temp 98.2°F | Resp 18

## 2019-03-25 DIAGNOSIS — C50411 Malignant neoplasm of upper-outer quadrant of right female breast: Secondary | ICD-10-CM

## 2019-03-25 DIAGNOSIS — Z171 Estrogen receptor negative status [ER-]: Secondary | ICD-10-CM

## 2019-03-25 DIAGNOSIS — Z95828 Presence of other vascular implants and grafts: Secondary | ICD-10-CM

## 2019-03-25 DIAGNOSIS — Z21 Asymptomatic human immunodeficiency virus [HIV] infection status: Secondary | ICD-10-CM | POA: Diagnosis not present

## 2019-03-25 DIAGNOSIS — C782 Secondary malignant neoplasm of pleura: Secondary | ICD-10-CM | POA: Diagnosis not present

## 2019-03-25 DIAGNOSIS — C7989 Secondary malignant neoplasm of other specified sites: Secondary | ICD-10-CM | POA: Diagnosis not present

## 2019-03-25 DIAGNOSIS — C50911 Malignant neoplasm of unspecified site of right female breast: Secondary | ICD-10-CM

## 2019-03-25 DIAGNOSIS — Z170 Estrogen receptor positive status [ER+]: Secondary | ICD-10-CM | POA: Diagnosis not present

## 2019-03-25 DIAGNOSIS — C770 Secondary and unspecified malignant neoplasm of lymph nodes of head, face and neck: Secondary | ICD-10-CM | POA: Diagnosis not present

## 2019-03-25 DIAGNOSIS — C773 Secondary and unspecified malignant neoplasm of axilla and upper limb lymph nodes: Secondary | ICD-10-CM | POA: Diagnosis not present

## 2019-03-25 DIAGNOSIS — Z5111 Encounter for antineoplastic chemotherapy: Secondary | ICD-10-CM | POA: Diagnosis not present

## 2019-03-25 DIAGNOSIS — C7951 Secondary malignant neoplasm of bone: Secondary | ICD-10-CM | POA: Diagnosis not present

## 2019-03-25 LAB — CBC WITH DIFFERENTIAL (CANCER CENTER ONLY)
Abs Immature Granulocytes: 0.01 10*3/uL (ref 0.00–0.07)
Basophils Absolute: 0.1 10*3/uL (ref 0.0–0.1)
Basophils Relative: 2 %
Eosinophils Absolute: 0.2 10*3/uL (ref 0.0–0.5)
Eosinophils Relative: 3 %
HCT: 34.5 % — ABNORMAL LOW (ref 36.0–46.0)
Hemoglobin: 11.2 g/dL — ABNORMAL LOW (ref 12.0–15.0)
Immature Granulocytes: 0 %
Lymphocytes Relative: 25 %
Lymphs Abs: 1.5 10*3/uL (ref 0.7–4.0)
MCH: 31 pg (ref 26.0–34.0)
MCHC: 32.5 g/dL (ref 30.0–36.0)
MCV: 95.6 fL (ref 80.0–100.0)
Monocytes Absolute: 0.9 10*3/uL (ref 0.1–1.0)
Monocytes Relative: 15 %
Neutro Abs: 3.1 10*3/uL (ref 1.7–7.7)
Neutrophils Relative %: 55 %
Platelet Count: 275 10*3/uL (ref 150–400)
RBC: 3.61 MIL/uL — ABNORMAL LOW (ref 3.87–5.11)
RDW: 15.1 % (ref 11.5–15.5)
WBC Count: 5.8 10*3/uL (ref 4.0–10.5)
nRBC: 0 % (ref 0.0–0.2)

## 2019-03-25 LAB — CMP (CANCER CENTER ONLY)
ALT: 9 U/L (ref 0–44)
AST: 33 U/L (ref 15–41)
Albumin: 3.7 g/dL (ref 3.5–5.0)
Alkaline Phosphatase: 57 U/L (ref 38–126)
Anion gap: 9 (ref 5–15)
BUN: 15 mg/dL (ref 8–23)
CO2: 26 mmol/L (ref 22–32)
Calcium: 10.3 mg/dL (ref 8.9–10.3)
Chloride: 105 mmol/L (ref 98–111)
Creatinine: 1.3 mg/dL — ABNORMAL HIGH (ref 0.44–1.00)
GFR, Est AFR Am: 51 mL/min — ABNORMAL LOW (ref >60)
GFR, Estimated: 44 mL/min — ABNORMAL LOW (ref >60)
Glucose, Bld: 88 mg/dL (ref 70–99)
Potassium: 3.8 mmol/L (ref 3.5–5.1)
Sodium: 140 mmol/L (ref 135–145)
Total Bilirubin: 0.4 mg/dL (ref 0.3–1.2)
Total Protein: 7.9 g/dL (ref 6.5–8.1)

## 2019-03-25 MED ORDER — PROCHLORPERAZINE MALEATE 10 MG PO TABS
ORAL_TABLET | ORAL | Status: AC
  Filled 2019-03-25: qty 1

## 2019-03-25 MED ORDER — ZOLEDRONIC ACID 4 MG/5ML IV CONC
3.0000 mg | Freq: Once | INTRAVENOUS | Status: AC
  Administered 2019-03-25: 16:00:00 3 mg via INTRAVENOUS
  Filled 2019-03-25: qty 3.75

## 2019-03-25 MED ORDER — PROCHLORPERAZINE MALEATE 10 MG PO TABS
10.0000 mg | ORAL_TABLET | Freq: Once | ORAL | Status: AC
  Administered 2019-03-25: 16:00:00 10 mg via ORAL

## 2019-03-25 MED ORDER — SODIUM CHLORIDE 0.9 % IV SOLN
Freq: Once | INTRAVENOUS | Status: AC
  Administered 2019-03-25: 15:00:00 via INTRAVENOUS
  Filled 2019-03-25: qty 250

## 2019-03-25 MED ORDER — SODIUM CHLORIDE 0.9% FLUSH
10.0000 mL | Freq: Once | INTRAVENOUS | Status: AC
  Administered 2019-03-25: 10 mL
  Filled 2019-03-25: qty 10

## 2019-03-25 MED ORDER — HEPARIN SOD (PORK) LOCK FLUSH 100 UNIT/ML IV SOLN
500.0000 [IU] | Freq: Once | INTRAVENOUS | Status: AC | PRN
  Administered 2019-03-25: 17:00:00 500 [IU]
  Filled 2019-03-25: qty 5

## 2019-03-25 MED ORDER — SODIUM CHLORIDE 0.9% FLUSH
10.0000 mL | INTRAVENOUS | Status: DC | PRN
  Administered 2019-03-25: 10 mL
  Filled 2019-03-25: qty 10

## 2019-03-25 MED ORDER — SODIUM CHLORIDE 0.9 % IV SOLN
1.2000 mg/m2 | Freq: Once | INTRAVENOUS | Status: AC
  Administered 2019-03-25: 2.75 mg via INTRAVENOUS
  Filled 2019-03-25: qty 5.5

## 2019-03-25 MED ORDER — PEGFILGRASTIM 6 MG/0.6ML ~~LOC~~ PSKT: 6.0000 mg | PREFILLED_SYRINGE | Freq: Once | SUBCUTANEOUS | Status: DC

## 2019-03-25 NOTE — Patient Instructions (Signed)
[facility] Discharge Instructions for Patients Receiving Chemotherapy  Today you received the following chemotherapy agents [place].   To help prevent nausea and vomiting after your treatment, we encourage you to take your nausea medication as directed  If you develop nausea and vomiting that is not controlled by your nausea medication, call the clinic.   BELOW ARE SYMPTOMS THAT SHOULD BE REPORTED IMMEDIATELY:  *FEVER GREATER THAN 100.5 F  *CHILLS WITH OR WITHOUT FEVER  NAUSEA AND VOMITING THAT IS NOT CONTROLLED WITH YOUR NAUSEA MEDICATION  *UNUSUAL SHORTNESS OF BREATH  *UNUSUAL BRUISING OR BLEEDING  TENDERNESS IN MOUTH AND THROAT WITH OR WITHOUT PRESENCE OF ULCERS  *URINARY PROBLEMS  *BOWEL PROBLEMS  UNUSUAL RASH Items with * indicate a potential emergency and should be followed up as soon as possible.  Feel free to call the clinic you have any questions or concerns. The clinic phone number is [phone].  Pegfilgrastim injection What is this medicine? PEGFILGRASTIM (PEG fil gra stim) is a long-acting granulocyte colony-stimulating factor that stimulates the growth of neutrophils, a type of white blood cell important in the body's fight against infection. It is used to reduce the incidence of fever and infection in patients with certain types of cancer who are receiving chemotherapy that affects the bone marrow, and to increase survival after being exposed to high doses of radiation. This medicine may be used for other purposes; ask your health care provider or pharmacist if you have questions. COMMON BRAND NAME(S): Steve Rattler, Ziextenzo What should I tell my health care provider before I take this medicine? They need to know if you have any of these conditions:  kidney disease  latex allergy  ongoing radiation therapy  sickle cell disease  skin reactions to acrylic adhesives (On-Body Injector only)  an unusual or allergic reaction  to pegfilgrastim, filgrastim, other medicines, foods, dyes, or preservatives  pregnant or trying to get pregnant  breast-feeding How should I use this medicine? This medicine is for injection under the skin. If you get this medicine at home, you will be taught how to prepare and give the pre-filled syringe or how to use the On-body Injector. Refer to the patient Instructions for Use for detailed instructions. Use exactly as directed. Tell your healthcare provider immediately if you suspect that the On-body Injector may not have performed as intended or if you suspect the use of the On-body Injector resulted in a missed or partial dose. It is important that you put your used needles and syringes in a special sharps container. Do not put them in a trash can. If you do not have a sharps container, call your pharmacist or healthcare provider to get one. Talk to your pediatrician regarding the use of this medicine in children. While this drug may be prescribed for selected conditions, precautions do apply. Overdosage: If you think you have taken too much of this medicine contact a poison control center or emergency room at once. NOTE: This medicine is only for you. Do not share this medicine with others. What if I miss a dose? It is important not to miss your dose. Call your doctor or health care professional if you miss your dose. If you miss a dose due to an On-body Injector failure or leakage, a new dose should be administered as soon as possible using a single prefilled syringe for manual use. What may interact with this medicine? Interactions have not been studied. Give your health care provider a list of all  the medicines, herbs, non-prescription drugs, or dietary supplements you use. Also tell them if you smoke, drink alcohol, or use illegal drugs. Some items may interact with your medicine. This list may not describe all possible interactions. Give your health care provider a list of all the  medicines, herbs, non-prescription drugs, or dietary supplements you use. Also tell them if you smoke, drink alcohol, or use illegal drugs. Some items may interact with your medicine. What should I watch for while using this medicine? You may need blood work done while you are taking this medicine. If you are going to need a MRI, CT scan, or other procedure, tell your doctor that you are using this medicine (On-Body Injector only). What side effects may I notice from receiving this medicine? Side effects that you should report to your doctor or health care professional as soon as possible:  allergic reactions like skin rash, itching or hives, swelling of the face, lips, or tongue  back pain  dizziness  fever  pain, redness, or irritation at site where injected  pinpoint red spots on the skin  red or dark-brown urine  shortness of breath or breathing problems  stomach or side pain, or pain at the shoulder  swelling  tiredness  trouble passing urine or change in the amount of urine Side effects that usually do not require medical attention (report to your doctor or health care professional if they continue or are bothersome):  bone pain  muscle pain This list may not describe all possible side effects. Call your doctor for medical advice about side effects. You may report side effects to FDA at 1-800-FDA-1088. Where should I keep my medicine? Keep out of the reach of children. If you are using this medicine at home, you will be instructed on how to store it. Throw away any unused medicine after the expiration date on the label. NOTE: This sheet is a summary. It may not cover all possible information. If you have questions about this medicine, talk to your doctor, pharmacist, or health care provider.  2020 Elsevier/Gold Standard (2017-09-16 16:57:08)

## 2019-03-25 NOTE — Progress Notes (Signed)
Dr. Jana Hakim would like to keep dose of zometa at the lower dose of 3mg  due to concerns of renal toxicity w/ concomitant CKD.   Demetrius Charity, PharmD, [facility] Pharmacist Pharmacy Phone: [phone] 03/25/2019

## 2019-03-26 ENCOUNTER — Other Ambulatory Visit: Payer: Self-pay

## 2019-03-26 LAB — CANCER ANTIGEN 27.29: CA 27.29: 49.4 U/mL — ABNORMAL HIGH (ref 0.0–38.6)

## 2019-03-27 ENCOUNTER — Telehealth: Payer: Self-pay | Admitting: Oncology

## 2019-03-27 NOTE — Telephone Encounter (Signed)
Scheduled appt per 10/2 sch message  - pt is aware of appt date and time

## 2019-03-27 NOTE — Addendum Note (Signed)
Addended by: Tora Kindred on: 03/27/2019 06:27 AM   Modules accepted: Orders

## 2019-03-28 ENCOUNTER — Inpatient Hospital Stay: Payer: Medicare HMO | Attending: Adult Health

## 2019-03-28 ENCOUNTER — Other Ambulatory Visit: Payer: Self-pay

## 2019-03-28 VITALS — BP 127/81 | HR 88 | Temp 98.7°F | Resp 18

## 2019-03-28 DIAGNOSIS — C773 Secondary and unspecified malignant neoplasm of axilla and upper limb lymph nodes: Secondary | ICD-10-CM | POA: Diagnosis not present

## 2019-03-28 DIAGNOSIS — Z23 Encounter for immunization: Secondary | ICD-10-CM | POA: Insufficient documentation

## 2019-03-28 DIAGNOSIS — Z79899 Other long term (current) drug therapy: Secondary | ICD-10-CM | POA: Diagnosis not present

## 2019-03-28 DIAGNOSIS — C50911 Malignant neoplasm of unspecified site of right female breast: Secondary | ICD-10-CM

## 2019-03-28 DIAGNOSIS — C7951 Secondary malignant neoplasm of bone: Secondary | ICD-10-CM | POA: Diagnosis not present

## 2019-03-28 DIAGNOSIS — Z5111 Encounter for antineoplastic chemotherapy: Secondary | ICD-10-CM | POA: Insufficient documentation

## 2019-03-28 DIAGNOSIS — C771 Secondary and unspecified malignant neoplasm of intrathoracic lymph nodes: Secondary | ICD-10-CM | POA: Insufficient documentation

## 2019-03-28 DIAGNOSIS — C782 Secondary malignant neoplasm of pleura: Secondary | ICD-10-CM | POA: Diagnosis not present

## 2019-03-28 DIAGNOSIS — C50411 Malignant neoplasm of upper-outer quadrant of right female breast: Secondary | ICD-10-CM | POA: Insufficient documentation

## 2019-03-28 DIAGNOSIS — Z5189 Encounter for other specified aftercare: Secondary | ICD-10-CM | POA: Diagnosis not present

## 2019-03-28 MED ORDER — PEGFILGRASTIM INJECTION 6 MG/0.6ML ~~LOC~~
6.0000 mg | PREFILLED_SYRINGE | Freq: Once | SUBCUTANEOUS | Status: AC
  Administered 2019-03-28: 10:00:00 6 mg via SUBCUTANEOUS

## 2019-03-28 NOTE — Patient Instructions (Signed)

## 2019-04-02 ENCOUNTER — Encounter: Payer: Self-pay | Admitting: Adult Health

## 2019-04-02 ENCOUNTER — Ambulatory Visit (INDEPENDENT_AMBULATORY_CARE_PROVIDER_SITE_OTHER): Payer: Medicare HMO | Admitting: Adult Health

## 2019-04-02 ENCOUNTER — Encounter: Payer: Medicare HMO | Admitting: Nurse Practitioner

## 2019-04-02 ENCOUNTER — Other Ambulatory Visit: Payer: Self-pay

## 2019-04-02 ENCOUNTER — Ambulatory Visit: Payer: Medicare HMO | Admitting: Adult Health

## 2019-04-02 ENCOUNTER — Ambulatory Visit: Payer: Medicare HMO | Admitting: Nurse Practitioner

## 2019-04-02 VITALS — BP 120/86 | HR 90 | Temp 97.7°F | Resp 18 | Ht 67.0 in | Wt 219.8 lb

## 2019-04-02 DIAGNOSIS — Z171 Estrogen receptor negative status [ER-]: Secondary | ICD-10-CM

## 2019-04-02 DIAGNOSIS — Z23 Encounter for immunization: Secondary | ICD-10-CM

## 2019-04-02 DIAGNOSIS — C50411 Malignant neoplasm of upper-outer quadrant of right female breast: Secondary | ICD-10-CM

## 2019-04-02 DIAGNOSIS — E890 Postprocedural hypothyroidism: Secondary | ICD-10-CM | POA: Diagnosis not present

## 2019-04-02 DIAGNOSIS — B20 Human immunodeficiency virus [HIV] disease: Secondary | ICD-10-CM

## 2019-04-02 DIAGNOSIS — I10 Essential (primary) hypertension: Secondary | ICD-10-CM

## 2019-04-02 DIAGNOSIS — Z0000 Encounter for general adult medical examination without abnormal findings: Secondary | ICD-10-CM

## 2019-04-02 NOTE — Progress Notes (Signed)
Location:  [employer] consultant of Service:  Clinic (12) Provider:  Durenda Age, DNP, FNP-BC  Patient Care Team: Lauree Chandler, NP as PCP - General (Nurse Practitioner) Tommy Medal, Lavell Islam, MD as PCP - Infectious Diseases (Infectious Diseases) Clent Jacks, MD as Consulting Physician (Ophthalmology) Renato Shin, MD as Consulting Physician (Endocrinology) Frederik Pear, MD as Consulting Physician (Orthopedic Surgery) Magrinat, Virgie Dad, MD as Consulting Physician (Oncology) Fanny Skates, MD as Consulting Physician (General Surgery) Donnamae Jude, MD as Consulting Physician (Obstetrics and Gynecology) Delice Bison, Charlestine Massed, NP as Nurse Practitioner (Hematology and Oncology) Webb Laws, [place] as Referring Physician (Optometry)  Extended Emergency Contact Information Primary Emergency Contact: Alonna Buckler Address: [address]          [place], [place] Phone: [phone] Mobile Phone: [phone] Relation: Sister Secondary Emergency Contact: [facility] Address: [place], [place] [address] [place] of [place] Phone: [phone] Relation: Daughter  Code Status:    Goals of care: Advanced Directive information Advanced Directives 10/17/2018  Does Patient Have a Medical Advance Directive? No  Type of Advance Directive -  Does patient want to make changes to medical advance directive? -  Copy of [place] in Chart? -  Would patient like information on creating a medical advance directive? Yes (MAU/Ambulatory/Procedural Areas - Information given)     Chief Complaint  Patient presents with   Error    HPI:  Pt is a 63 y.o. female seen today for medical management of chronic diseases.   No major concerns  Nothing comes to mind SOB when walking No oxygen     Past Medical History:  Diagnosis Date   Alopecia areata 11/28/2009   Bell's palsy      Cancer ([place]) 2005   Breast cancer   chemotherapy and radiation   CKD (chronic kidney disease) stage 3, GFR 30-59 ml/min 06/08/2015   Dry eye syndrome    Family history of lung cancer    Family history of non-Hodgkin's lymphoma    Fasting hyperglycemia    Gestational diabetes    2001   HIP FRACTURE, RIGHT 05/06/2008   History of kidney stones    HIV DISEASE 03/27/2006   HYPERLIPIDEMIA, MIXED 12/15/2007   HYPERTENSION 03/27/2006   HYPOTHYROIDISM, POST-RADIATION 06/28/2008   MENORRHAGIA, POSTMENOPAUSAL 02/03/2009   Osteoarthritis of left knee 06/08/2015   OSTEOARTHROSIS, LOCAL, SCND, UNSPC SITE 04/07/2007   Personal history of chemotherapy 11/2018   PVD 04/07/2007   Tinea capitis    TRIGGER FINGER 05/06/2008   Unspecified vitamin D deficiency 08/06/2007   Past Surgical History:  Procedure Laterality Date   BREAST LUMPECTOMY Right    2005   CHEST TUBE INSERTION Right 05/30/2018   Procedure: INSERTION PLEURAL DRAINAGE CATHETER;  Surgeon: Grace Isaac, MD;  Location: [facility] OR;  Service: Thoracic;  Laterality: Right;   COLONOSCOPY     COLONOSCOPY WITH ESOPHAGOGASTRODUODENOSCOPY (EGD)     ENDOBRONCHIAL ULTRASOUND Bilateral 05/15/2018   Procedure: ENDOBRONCHIAL ULTRASOUND;  Surgeon: Collene Gobble, MD;  Location: [facility] ENDOSCOPY;  Service: Cardiopulmonary;  Laterality: Bilateral;   FINE NEEDLE ASPIRATION BIOPSY  05/15/2018   Procedure: FINE NEEDLE ASPIRATION BIOPSY;  Surgeon: Collene Gobble, MD;  Location: [facility];  Service: Cardiopulmonary;;   FLEXIBLE BRONCHOSCOPY  05/15/2018   Procedure: FLEXIBLE BRONCHOSCOPY;  Surgeon: Collene Gobble, MD;  Location: [facility] END[facility];  Service: Cardiopulmonary;;   HYSTEROSCOPY  2006   IR IMAGING GUIDED PORT INSERTION  07/14/2018   IR THORACENTESIS ASP PLEURAL SPACE W/IMG GUIDE  04/14/2018   IR THORACENTESIS ASP PLEURAL SPACE W/IMG GUIDE  04/28/2018   KNEE ARTHROSCOPY Left 2002   LYMPH NODE DISSECTION  2005    MASTECTOMY Right    MASTECTOMY MODIFIED RADICAL Right 11/05/2016   Procedure: RIGHT MASTECTOMY MODIFIED RADICAL;  Surgeon: Fanny Skates, MD;  Location: [facility];  Service: General;  Laterality: Right;   MODIFIED RADICAL MASTECTOMY Right 11/05/2016   placement   of port-a-cath     PLEURAL BIOPSY Right 05/30/2018   Procedure: PLEURAL BIOPSY;  Surgeon: Grace Isaac, MD;  Location: [facility];  Service: Thoracic;  Laterality: Right;   PLEURAL EFFUSION DRAINAGE Right 05/30/2018   Procedure: DRAINAGE OF PLEURAL EFFUSION;  Surgeon: Grace Isaac, MD;  Location: [facility];  Service: Thoracic;  Laterality: Right;   PORT-A-CATH REMOVAL  2006   insertion 2005   PORT-A-CATH REMOVAL N/A 03/29/2017   Procedure: REMOVAL PORT-A-CATH;  Surgeon: Fanny Skates, MD;  Location: [facility];  Service: General;  Laterality: N/A;   PORTACATH PLACEMENT Right 11/05/2016   Procedure: INSERTION PORT-A-CATH WITH ULTRA SOUND;  Surgeon: Fanny Skates, MD;  Location: [facility];  Service: General;  Laterality: Right;   removal of port a cath  12/2014   TALC PLEURODESIS Right 05/30/2018   Procedure: Pietro Cassis;  Surgeon: Grace Isaac, MD;  Location: [facility];  Service: Thoracic;  Laterality: Right;   THYROID SURGERY  2009   Ablation    TOTAL KNEE ARTHROPLASTY Left 02/06/2016   Procedure: TOTAL KNEE ARTHROPLASTY;  Surgeon: Frederik Pear, MD;  Location: [facility];  Service: Orthopedics;  Laterality: Left;   TUBAL LIGATION     VIDEO ASSISTED THORACOSCOPY Right 05/30/2018   Procedure: VIDEO ASSISTED THORACOSCOPY;  Surgeon: Grace Isaac, MD;  Location: [facility];  Service: Thoracic;  Laterality: Right;    Allergies  Allergen Reactions   Lisinopril Anaphylaxis and Swelling    Swelling of tongue and mouth 11/05/16- tolerates Olmesartan   Pepcid [Famotidine] Other (See Comments)    PPI H2, BLOCKERS LOWER GASTRIC PH WHICH WOULD LEAD TO SUBTHERAPEUTIC RILPIVIRINE LEVELS AND POTENTIAL VIROLOGICAL FAILURE WITH RESISTANCE    Prilosec [Omeprazole] Other (See Comments)    PPI H2, BLOCKERS LOWER GASTRIC PH WHICH WOULD LEAD TO SUBTHERAPEUTIC RILPIVIRINE LEVELS AND POTENTIAL VIROLOGICAL FAILURE WITH RESISTANCE   Tums [Calcium Carbonate Antacid] Other (See Comments)    TUMS ANTACIDS CAN LOWER GASTRIC PH WHICH COULD  LEAD TO SUBTHERAPEUTIC RILPIVIRINE LEVELS AND POTENTIAL VIROLOGICAL FAILURE WITH RESISTANCE TUMS CAN BE GIVEN BUT NEED CONSULT WITH [facility] RE TIMING. I PREFER HER TO AVOID ALL TOGETHER    Outpatient Encounter Medications as of 04/02/2019  Medication Sig   ELDERBERRY PO Take 50 mg by mouth daily.   HYDROcodone-acetaminophen (NORCO/VICODIN) 5-325 MG tablet TAKE ONE TABLET 4 TIMES A DAY AS INSTRUCTED   hydrocortisone cream 1 % Apply 1 application topically daily as needed for itching.   JULUCA 50-25 MG TABS TAKE 1 TABLET BY MOUTH DAILY WITH BREAKFAST. TAKE WITH THE PREZCOBIX.   levothyroxine (SYNTHROID) 200 MCG tablet Take 1 tablet (200 mcg total) by mouth daily before breakfast.   lidocaine-prilocaine (EMLA) cream APPLY 1 APPLICATION TOPICALLY AS NEEDED. APPLY TO PORT SITE 1 HOUR PRIOR TO ACCESS   Loratadine 10 MG CAPS Take by mouth as needed.   Menthol, Topical Analgesic, (BENGAY EX) Apply 1 application topically daily as needed (muscle pain).   methocarbamol (  ROBAXIN) 500 MG tablet Take 1 tablet (500 mg total) by mouth every 8 (eight) hours as needed for muscle spasms.   olmesartan-hydrochlorothiazide (BENICAR HCT) 40-25 MG tablet TAKE 1 TABLET BY MOUTH DAILY.   PREZCOBIX 800-150 MG tablet TAKE 1 TABLET BY MOUTH DAILY. SWALLOW WHOLE. DO NOT CRUSH, BREAK OR CHEW TABLETS. TAKE WITH FOOD.   Propylene Glycol (SYSTANE BALANCE OP) Place 1 drop into both eyes 4 (four) times daily as needed.   SELZENTRY 150 MG tablet TAKE ONE TABLET BY MOUTH TWICE DAILY   Study - REPRIEVE [phone] - pitavastatin 4 mg or placebo tablet (PI-[place]) Take 1 tablet (4 mg total) by mouth daily.   No  facility-administered encounter medications on file as of 04/02/2019.     Review of Systems  GENERAL: No change in appetite, no fatigue, no weight changes, no fever, chills or weakness SKIN: Denies rash, itching, wounds, ulcer sores, or nail abnormalities EYES: Denies change in vision, dry eyes, eye pain, itching or discharge EARS: Denies change in hearing, ringing in ears, or earache NOSE: Denies nasal congestion or epistaxis MOUTH and THROAT: Denies oral discomfort, gingival pain or bleeding, pain from teeth or hoarseness   RESPIRATORY: no cough, SOB, DOE, wheezing, hemoptysis CARDIAC: No chest pain, edema or palpitations GI: No abdominal pain, diarrhea, constipation, heart burn, nausea or vomiting GU: Denies dysuria, frequency, hematuria, incontinence, or discharge MUSCULOSKELETAL: Denies joint pain, muscle pain, back pain, restricted movement, or unusual weakness CIRCULATION: Denies claudication, edema of legs, varicosities, or cold extremities NEUROLOGICAL: Denies dizziness, syncope, numbness, or headache PSYCHIATRIC: Denies feelings of depression or anxiety. No report of hallucinations, insomnia, paranoia, or agitation ENDOCRINE: Denies polyphagia, polyuria, polydipsia, heat or cold intolerance HEME/LYMPH: Denies excessive bruising, petechia, enlarged lymph nodes, or bleeding problems IMMUNOLOGIC: Denies history of frequent infections, AIDS, or use of immunosuppressive agents   Immunization History  Administered Date(s) Administered   Hepatitis B 07/11/1992, 11/17/2001, 03/02/2002, 04/09/2011   Influenza Split 04/10/2011, 03/05/2012   Influenza Whole 03/27/2006, 04/07/2007, 05/06/2008, 05/16/2009, 09/27/2010   Influenza,inj,Quad PF,6+ Mos 03/09/2013, 04/23/2014, 03/22/2016, 04/18/2018, 04/03/2019   Influenza-Unspecified 06/08/2015, 03/06/2017   Pneumococcal Conjugate-13 01/16/2018   Pneumococcal Polysaccharide-23 03/27/2006, 02/27/2012   Pertinent  Health Maintenance Due   Topic Date Due   PAP SMEAR-Modifier  12/26/2019   MAMMOGRAM  12/21/2020   INFLUENZA VACCINE  Completed   Fall Risk  04/02/2019 04/02/2019 10/17/2018 10/17/2018 10/09/2018  Falls in the past year? 0 0 0 0 0  Number falls in past yr: 0 0 0 0 -  Injury with Fall? - - 0 0 -  Comment - - - - -  Risk for fall due to : - - - - -  Risk for fall due to: Comment - - - - -  Follow up - - - - -     There were no vitals filed for this visit. There is no height or weight on file to calculate BMI.  Physical Exam  GENERAL APPEARANCE: Well nourished. In no acute distress. Normal body habitus SKIN:  Skin is warm and dry. There are no suspicious lesions or rash HEAD: Normal in size and contour. No evidence of trauma EYES: Lids open and close normally. No blepharitis, entropion or ectropion. PERRL. Conjunctivae are clear and sclerae are white. Lenses are without opacity EARS: Pinnae are normal. Patient hears normal voice tunes of the examiner MOUTH and THROAT: Lips are without lesions. Oral mucosa is moist and without lesions. Tongue is normal in shape, size, and color  and without lesions NECK: supple, trachea midline, no neck masses, no thyroid tenderness, no thyromegaly LYMPHATICS: No LAN in the neck, no supraclavicular LAN RESPIRATORY: Breathing is even & unlabored, BS CTAB CARDIAC: RRR, no murmur,no extra heart sounds, no edema GI: Abdomen soft, normal BS, no masses, no tenderness, no hepatomegaly, no splenomegaly MUSCULOSKELETAL: No deformities. Movement at each extremity is full and painless. Strength is 5/5 at each extremity. Back is without kyphosis or scoliosis CIRCULATION: Pedal pulses are 2+. There is no edema of the legs, ankles and feet NEUROLOGICAL: There is no tremor. Speech is clear PSYCHIATRIC: Alert and oriented X 3. Affect and behavior are appropriate  Labs reviewed: Recent Labs    03/20/19 1235 03/25/19 1413 04/03/19 1258  NA 140 140 139  K 3.6 3.8 3.7  CL 103 105 101    CO2 _0 GLUCOSE 86 88 94  BUN _1 CREATININE 1.30* 1.30* 1.40*  CALCIUM 10.3 10.3 9.7   Recent Labs    03/20/19 1235 03/25/19 1413 04/03/19 1258  AST 37 33 39  ALT _2 ALKPHOS 55 57 124  BILITOT 0.4 0.4 0.2*  PROT 7.8 7.9 8.2*  ALBUMIN 3.8 3.7 3.7   Recent Labs    03/20/19 1235 03/25/19 1413 04/03/19 1258  WBC 3.5* 5.8 27.1*  NEUTROABS 1.0* 3.1 16.3*  HGB 11.2* 11.2* 11.7*  HCT 34.1* 34.5* 35.6*  MCV 95.3 95.6 94.4  PLT 282 275 266   Lab Results  Component Value Date   TSH 1.596 04/03/2019   Lab Results  Component Value Date   HGBA1C 6.1 03/07/2019   Lab Results  Component Value Date   CHOL 179 09/18/2018   HDL 54 09/18/2018   LDLCALC 101 (H) 09/18/2018   TRIG 141 09/18/2018   CHOLHDL 3.3 09/18/2018    Significant Diagnostic Results in last 30 days:  Nm Pet Image Restag (ps) Skull Base To Thigh  Result Date: 03/12/2019 CLINICAL DATA:  Subsequent treatment strategy for metastatic breast cancer. EXAM: NUCLEAR MEDICINE PET SKULL BASE TO THIGH TECHNIQUE: 11.7 mCi F-18 FDG was injected intravenously. Full-ring PET imaging was performed from the skull base to thigh after the radiotracer. CT data was obtained and used for attenuation correction and anatomic localization. Fasting blood glucose: 93 mg/dl COMPARISON:  Multiple exams, including 11/28/2018 FINDINGS: Mediastinal blood pool activity: SUV max 2.0 Liver activity: SUV max 2.7 NECK: No significant abnormal hypermetabolic activity in this region. Incidental CT findings: Mucous retention cyst in the left maxillary sinus. CHEST: Overall mild improvement in adenopathy and pleural tumor compared to prior. A lower paratracheal node anterior to the carina measures 1.6 cm in short axis on image 60/4 (formerly 2.6 cm) with maximum SUV 7.6 (formerly 15.6). The right middle lobe mass is primarily photopenic today, with loss of much of the peripheral activity along this mass. It is difficult to measure the  signal intensity of this mass separate from the surrounding pleural metastatic rind, but posterior portions the mass maximum SUV of about 9.3, formerly 22.4. The centrally necrotic portion of the mass is hypodense and measures about 3.8 cm in diameter, formerly 3.1 cm, indicating increased central necrosis. The pleural rind tumor demonstrates reduced thickness and reduced metabolic activity. Posterior pleural tumor just below the level of the carina has maximum SUV of 10.9, formerly 14.7. Although the trend is generally improvement, there are several lymph nodes which are small but have increased metabolic activity compared to the prior exam. For example, a  pericardial lymph node measuring 0.7 cm in short axis on image 62/4 (formerly the same) has a maximum SUV of 6.0 today, previously 4.6. A prevascular node measuring 1.1 cm in short axis on image 55/4 (formerly 0.9 cm) has a maximum SUV of 6.3, formerly 3.7. Incidental CT findings: Right Port-A-Cath tip: Right atrium. Atherosclerotic aortic arch. Moderate cardiomegaly. ABDOMEN/PELVIS: Previous gastrohepatic ligament adenopathy is indistinct today but measures about 1.0 cm in short axis on image 94/4 (formerly 1.7 cm) with maximum SUV 1.8 (formerly 9.2). A previously mildly hypermetabolic lymph node near the apex of the left adrenal gland is no longer readily apparent. There continues to be some mildly hypermetabolic retroperitoneal adenopathy, with a left para-aortic lymph node measuring 0.9 cm in short axis on image 114/4 (formerly 0.9 cm) with maximum SUV 9.7 (formerly 12.8). Incidental CT findings: Small dependent gallstones in the gallbladder. Mild sigmoid colon diverticulosis. SKELETON: Progressive hypermetabolic activity and progressive lytic lesions eccentric to the right in the T1 vertebral body and posterior elements, maximum SUV 8.9, previously 7.2. New lytic metastatic lesions including in the right skull base just lateral to the foramen magnum; at L5;  in both iliac bones; and in both femoral heads. The left eccentric lesion in the L5 vertebral body has maximum standard uptake value of 8.8; the right femoral head lesion has maximum SUV of 10.9. Incidental CT findings: Grade 1 anterolisthesis at L4-5 and L5-S1. IMPRESSION: 1. Mixed appearance, with generalized mild to moderate improvement in thoracic and upper abdominal adenopathy and soft tissue metastatic lesions including the right pleural space; but with multiple new osseous metastatic lesions identified. 2. Other imaging findings of potential clinical significance: Search after thorough moderate cardiomegaly. Chronic left maxillary sinusitis. Cholelithiasis. Sigmoid colon diverticulosis. Electronically Signed   By: Van Clines M.D.   On: 03/12/2019 07:57    Assessment/Plan    Family/ staff Communication:   Labs/tests ordered:    Goals of care:     Durenda Age, DNP, FNP-BC [facility] [phone] (Monday-Friday 8:00 a.m. - 5:00 p.m.) [phone] (after hours)  This encounter was created in error - please disregard.

## 2019-04-02 NOTE — Progress Notes (Signed)
Subjective:   Kayla Price is a 63 y.o. female who presents for Medicare Annual (Subsequent) preventive examination.  Review of Systems:   Cardiac Risk Factors include: hypertension;obesity (BMI >30kg/m2) Body mass index is 34.43 kg/m.      Objective:     Vitals: BP 120/86   Pulse 90   Temp 97.7 F (36.5 C)   Resp 18   Ht _0  (1.702 m)   Wt 219 lb 12.8 oz (99.7 kg)   LMP 11/06/2003   SpO2 94%   BMI 34.43 kg/m   Body mass index is 34.43 kg/m.  Advanced Directives 10/17/2018 10/17/2018 09/26/2018 07/14/2018 05/30/2018 05/15/2018 09/20/2017  Does Patient Have a Medical Advance Directive? No No No Yes;No No No No  Type of Advance Directive - - - - - - -  Does patient want to make changes to medical advance directive? - - - - - - -  Copy of [place] in Chart? - - - - - - -  Would patient like information on creating a medical advance directive? Yes (MAU/Ambulatory/Procedural Areas - Information given) Yes (MAU/Ambulatory/Procedural Areas - Information given) No - Patient declined No - Patient declined No - Patient declined No - Patient declined Yes (MAU/Ambulatory/Procedural Areas - Information given)    Tobacco Social History   Tobacco Use  Smoking Status Never Smoker  Smokeless Tobacco Never Used     Counseling given: Not Answered   Clinical Intake:  Pre-visit preparation completed: No  Pain : 0-10 Pain Score: 5 (right shoulder) Pain Type: Chronic pain Pain Location: Shoulder Pain Orientation: Right Pain Descriptors / Indicators: Aching Pain Onset: Other (comment)(started 2005) Pain Frequency: Several days a week Pain Relieving Factors: Norco PRN and Robaxin PRN Effect of Pain on Daily Activities: noe effect  Pain Relieving Factors: Norco PRN and Robaxin PRN  BMI - recorded: 34.43 Nutritional Status: BMI > 30  Obese Nutritional Risks: Unintentional weight loss Diabetes: No  How often do you need to have someone help you when you  read instructions, pamphlets, or other written materials from your doctor or pharmacy?: 1 - Never What is the last grade level you completed in school?: College graduate  Interpreter Needed?: No  Information entered by :: Samyiah Halvorsen Price - NP  Past Medical History:  Diagnosis Date  . Alopecia areata 11/28/2009  . Bell's palsy   . Cancer [facility]) 2005   Breast cancer   chemotherapy and radiation  . CKD (chronic kidney disease) stage 3, GFR 30-59 ml/min 06/08/2015  . Dry eye syndrome   . Family history of lung cancer   . Family history of non-Hodgkin's lymphoma   . Fasting hyperglycemia   . Gestational diabetes    2001  . HIP FRACTURE, RIGHT 05/06/2008  . History of kidney stones   . HIV DISEASE 03/27/2006  . HYPERLIPIDEMIA, MIXED 12/15/2007  . HYPERTENSION 03/27/2006  . 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 MENORRHAGIA, POSTMENOPAUSAL 02/03/2009  . Osteoarthritis of left knee 06/08/2015  . OSTEOARTHROSIS, LOCAL, SCND, UNSPC SITE 04/07/2007  . Personal history of chemotherapy 11/2018  . PVD 04/07/2007  . Tinea capitis   . TRIGGER FINGER 05/06/2008  . Unspecified vitamin D deficiency 08/06/2007   Past Surgical History:  Procedure Laterality Date  . BREAST LUMPECTOMY Right    2005  . CHEST TUBE INSERTION Right 05/30/2018   Procedure: INSERTION PLEURAL DRAINAGE CATHETER;  Surgeon: Grace Isaac, MD;  Location: [place];  Service: Thoracic;  Laterality: Right;  . COLONOSCOPY    .  COLONOSCOPY WITH ESOPHAGOGASTRODUODENOSCOPY (EGD)    . ENDOBRONCHIAL ULTRASOUND Bilateral 05/15/2018   Procedure: ENDOBRONCHIAL ULTRASOUND;  Surgeon: Collene Gobble, MD;  Location: [facility];  Service: Cardiopulmonary;  Laterality: Bilateral;  . FINE NEEDLE ASPIRATION BIOPSY  05/15/2018   Procedure: FINE NEEDLE ASPIRATION BIOPSY;  Surgeon: Collene Gobble, MD;  Location: [facility];  Service: Cardiopulmonary;;  . FLEXIBLE BRONCHOSCOPY  05/15/2018   Procedure: FLEXIBLE BRONCHOSCOPY;  Surgeon:  Collene Gobble, MD;  Location: [facility];  Service: Cardiopulmonary;;  . HYSTEROSCOPY  2006  . IR IMAGING GUIDED PORT INSERTION  07/14/2018  . IR THORACENTESIS ASP PLEURAL SPACE W/IMG GUIDE  04/14/2018  . IR THORACENTESIS ASP PLEURAL SPACE W/IMG GUIDE  04/28/2018  . KNEE ARTHROSCOPY Left 2002  . LYMPH NODE DISSECTION  2005  . MASTECTOMY Right   . MASTECTOMY MODIFIED RADICAL Right 11/05/2016   Procedure: RIGHT MASTECTOMY MODIFIED RADICAL;  Surgeon: Fanny Skates, MD;  Location: [place];  Service: General;  Laterality: Right;  . MODIFIED RADICAL MASTECTOMY Right 11/05/2016  . placement   of port-a-cath    . PLEURAL BIOPSY Right 05/30/2018   Procedure: PLEURAL BIOPSY;  Surgeon: Grace Isaac, MD;  Location: [place];  Service: Thoracic;  Laterality: Right;  . PLEURAL EFFUSION DRAINAGE Right 05/30/2018   Procedure: DRAINAGE OF PLEURAL EFFUSION;  Surgeon: Grace Isaac, MD;  Location: [place];  Service: Thoracic;  Laterality: Right;  . PORT-A-CATH REMOVAL  2006   insertion 2005  . PORT-A-CATH REMOVAL N/A 03/29/2017   Procedure: REMOVAL PORT-A-CATH;  Surgeon: Fanny Skates, MD;  Location: [place];  Service: General;  Laterality: N/A;  . PORTACATH PLACEMENT Right 11/05/2016   Procedure: INSERTION PORT-A-CATH WITH ULTRA SOUND;  Surgeon: Fanny Skates, MD;  Location: [place];  Service: General;  Laterality: Right;  . removal of port a cath  12/2014  . TALC PLEURODESIS Right 05/30/2018   Procedure: Pietro Cassis;  Surgeon: Grace Isaac, MD;  Location: [place];  Service: Thoracic;  Laterality: Right;  . THYROID SURGERY  2009   Ablation   . TOTAL KNEE ARTHROPLASTY Left 02/06/2016   Procedure: TOTAL KNEE ARTHROPLASTY;  Surgeon: Frederik Pear, MD;  Location: [place];  Service: Orthopedics;  Laterality: Left;  . TUBAL LIGATION    . VIDEO ASSISTED THORACOSCOPY Right 05/30/2018   Procedure: VIDEO ASSISTED THORACOSCOPY;  Surgeon: Grace Isaac, MD;  Location[facility];  Service: Thoracic;   Laterality: Right;   Family History  Problem Relation Age of Onset  . Heart attack Brother        Massive MI in 44s  . Stroke Brother        CAD  . Kidney disease Mother   . Stroke Mother   . Diabetes Mother   . Liver disease Sister   . COPD Sister        had I-131 rx of hyperthyroidism  . Diabetes Sister   . Stroke Sister   . Lung cancer Paternal Uncle        hx smoking  . Cancer Cousin   . Non-Hodgkin's lymphoma Cousin 25       cancer x3, in prostate and lung- unsure if met/spread or if primaries  . Heart failure Father   . Heart disease Father   . Arthritis Father   . Sarcoidosis Sister    Social History   Socioeconomic History  . Marital status: Widowed    Spouse name: Not on file  . Number of children: Not on file  .  Years of education: Not on file  . Highest education level: Not on file  Occupational History  . Occupation: Paediatric nurse: UNEMPLOYED  Social Needs  . Financial resource strain: Not hard at all  . Food insecurity    Worry: Never true    Inability: Never true  . Transportation needs    Medical: No    Non-medical: No  Tobacco Use  . Smoking status: Never Smoker  . Smokeless tobacco: Never Used  Substance and Sexual Activity  . Alcohol use: No  . Drug use: No  . Sexual activity: Not Currently    Birth control/protection: Post-menopausal    Comment: declined condoms  Lifestyle  . Physical activity    Days per week: 3 days    Minutes per session: 120 min  . Stress: Not at all  Relationships  . Social connections    Talks on phone: More than three times a week    Gets together: More than three times a week    Attends religious service: More than 4 times per year    Active member of club or organization: No    Attends meetings of clubs or organizations: Never    Relationship status: Widowed  Other Topics Concern  . Not on file  Social History Narrative   Single/widow   Diet: good   Do you drink/eat things with caffeine?  Tea occasionally   Marital status: Widowed  What year were you married? 1993   Do you live in a house, apartment,assisted living, condo,trailer,ect.)? House   Is it one or more stories? Two   How many persons live in your home? 4   Do you have any pets in you home? No   Current or past profession: Tour manager   Do you exercise? Yes  Type&how often: Stationary Bike, Water Exercise 3-4 x week   Do you have a living will? Yes   Do you have a DNR form? No  If not do you what one?   Do you have signed POA/HPOA forms? No   If so, please bring to your appointment.                Outpatient Encounter Medications as of 04/02/2019  Medication Sig  . ELDERBERRY PO Take 50 mg by mouth daily.  Marland Kitchen HYDROcodone-acetaminophen (NORCO/VICODIN) 5-325 MG tablet TAKE ONE TABLET 4 TIMES A DAY AS INSTRUCTED  . hydrocortisone cream 1 % Apply 1 application topically daily as needed for itching.  . JULUCA 50-25 MG TABS TAKE 1 TABLET BY MOUTH DAILY WITH BREAKFAST. TAKE WITH THE PREZCOBIX.  Marland Kitchen levothyroxine (SYNTHROID) 200 MCG tablet Take 1 tablet (200 mcg total) by mouth daily before breakfast.  . lidocaine-prilocaine (EMLA) cream APPLY 1 APPLICATION TOPICALLY AS NEEDED. APPLY TO PORT SITE 1 HOUR PRIOR TO ACCESS  . Loratadine 10 MG CAPS Take by mouth as needed.  . Menthol, Topical Analgesic, (BENGAY EX) Apply 1 application topically daily as needed (muscle pain).  . methocarbamol (ROBAXIN) 500 MG tablet Take 1 tablet (500 mg total) by mouth every 8 (eight) hours as needed for muscle spasms.  Marland Kitchen olmesartan-hydrochlorothiazide (BENICAR HCT) 40-25 MG tablet TAKE 1 TABLET BY MOUTH DAILY.  Marland Kitchen PREZCOBIX 800-150 MG tablet TAKE 1 TABLET BY MOUTH DAILY. SWALLOW WHOLE. DO NOT CRUSH, BREAK OR CHEW TABLETS. TAKE WITH FOOD.  Marland Kitchen Propylene Glycol (SYSTANE BALANCE OP) Place 1 drop into both eyes 4 (four) times daily as needed.  Marland Kitchen SELZENTRY 150 MG tablet TAKE  ONE TABLET BY MOUTH TWICE DAILY  . Study - REPRIEVE [phone] - pitavastatin 4  mg or placebo tablet (PI-[place]) Take 1 tablet (4 mg total) by mouth daily.  . [DISCONTINUED] CALCIUM CITRATE-VITAMIN D3 PO Take by mouth. 630 mg of calcium and 13 mcg d3, take once daily  . [DISCONTINUED] Cholecalciferol (D3 ADULT PO) Take 25 mcg by mouth daily.  . [DISCONTINUED] Coenzyme Q10 (COQ10) 100 MG CAPS Take by mouth daily.  . [DISCONTINUED] dexamethasone (DECADRON) 4 MG tablet Take 1-2 tablets (4-8 mg total) by mouth 2 (two) times daily as needed.  . [DISCONTINUED] levothyroxine (SYNTHROID) 200 MCG tablet Take 1 tablet (200 mcg total) by mouth daily before breakfast.  . [DISCONTINUED] Methylsulfonylmethane (MSM) 1000 MG TABS Take by mouth daily.  . [DISCONTINUED] Naphazoline-Glycerin (REDNESS RELIEF OP) Place 1 drop into both eyes daily as needed (redness).  . [DISCONTINUED] Omega-3 Fatty Acids (FISH OIL) 1000 MG CAPS Take by mouth daily.  . [DISCONTINUED] potassium gluconate 595 (99 K) MG TABS tablet Take 595 mg by mouth daily.  . [DISCONTINUED] Probiotic Product (PROBIOTIC DAILY PO) Take by mouth daily.  . [DISCONTINUED] prochlorperazine (COMPAZINE) 10 MG tablet Take 1 tablet (10 mg total) by mouth every 6 (six) hours as needed for nausea or vomiting.  . [DISCONTINUED] tobramycin-dexamethasone (TOBRADEX) ophthalmic solution Place 2 drops into the right eye every 4 (four) hours while awake.  . [DISCONTINUED] Vitamin E 180 MG CAPS Take by mouth daily.   No facility-administered encounter medications on file as of 04/02/2019.     Activities of Daily Living In your present state of health, do you have any difficulty performing the following activities: 04/02/2019 10/17/2018  Hearing? N N  Vision? N N  Difficulty concentrating or making decisions? N N  Walking or climbing stairs? N Y  Comment - knee pain  Dressing or bathing? N N  Doing errands, shopping? N N  Preparing Food and eating ? N N  Using the Toilet? N N  In the past six months, have you accidently leaked urine? N Y  Do  you have problems with loss of bowel control? N N  Managing your Medications? N N  Managing your Finances? N N  Housekeeping or managing your Housekeeping? N N  Some recent data might be hidden    Patient Care Team: Lauree Chandler, NP as PCP - General (Nurse Practitioner) Tommy Medal, Lavell Islam, MD as PCP - Infectious Diseases (Infectious Diseases) Clent Jacks, MD as Consulting Physician (Ophthalmology) Renato Shin, MD as Consulting Physician (Endocrinology) Frederik Pear, MD as Consulting Physician (Orthopedic Surgery) Magrinat, Virgie Dad, MD as Consulting Physician (Oncology) Fanny Skates, MD as Consulting Physician (General Surgery) Donnamae Jude, MD as Consulting Physician (Obstetrics and Gynecology) Delice Bison Charlestine Massed, NP as Nurse Practitioner (Hematology and Oncology) Webb Laws, [facility] as Referring Physician (Optometry)    Assessment:   This is a routine wellness examination for [facility]  Exercise Activities and Dietary recommendations Current Exercise Habits: The patient does not participate in regular exercise at present, Exercise limited by: None identified  Goals    . DIET - REDUCE CALORIE INTAKE     To limit fat and sodium intake to help promote healthy weight.     . Exercise 3x per week (30 min per time)     Starting 09/18/16 I will continue my days at the gym and try to lose weight.     . Weight (lb) < 200 lb (90.7 kg)  Fall Risk Fall Risk  04/02/2019 04/02/2019 10/17/2018 10/17/2018 10/09/2018  Falls in the past year? 0 0 0 0 0  Number falls in past yr: 0 0 0 0 -  Injury with Fall? - - 0 0 -  Comment - - - - -  Risk for fall due to : - - - - -  Risk for fall due to: Comment - - - - -  Follow up - - - - -   Is the patient's home free of loose throw rugs in walkways, pet beds, electrical cords, etc?   yes      Grab bars in the bathroom? yes      Handrails on the stairs?   no      Adequate lighting?   no  Timed Get Up and Go performed:  No  Depression Screen PHQ 2/9 Scores 10/17/2018 10/17/2018 10/09/2018 05/19/2018  PHQ - 2 Score 0 0 0 0     Cognitive Function MMSE - Mini Mental State Exam 04/02/2019 09/18/2016  Not completed: - Unable to complete  Orientation to time 5 -  Orientation to Place 5 -  Registration 3 -  Attention/ Calculation 5 -  Recall 3 -  Language- name 2 objects 2 -  Language- repeat 1 -  Language- follow 3 step command 3 -  Language- read & follow direction 1 -  Write a sentence 1 -  Copy design 1 -  Total score 30 -     6CIT Screen 04/02/2019 10/17/2018  What Year? 0 points 0 points  What month? 0 points 0 points  What time? 0 points 0 points  Count back from 20 0 points 0 points  Months in reverse 0 points 0 points  Repeat phrase 0 points 0 points  Total Score 0 0    Immunization History  Administered Date(s) Administered  . Hepatitis B 07/11/1992, 11/17/2001, 03/02/2002, 04/09/2011  . Influenza Split 04/10/2011, 03/05/2012  . Influenza Whole 03/27/2006, 04/07/2007, 05/06/2008, 05/16/2009, 09/27/2010  . Influenza,inj,Quad PF,6+ Mos 03/09/2013, 04/23/2014, 03/22/2016, 04/18/2018  . Influenza-Unspecified 06/08/2015, 03/06/2017  . Pneumococcal Conjugate-13 01/16/2018  . Pneumococcal Polysaccharide-23 03/27/2006, 02/27/2012    Qualifies for Shingles Vaccine? No  Screening Tests Health Maintenance  Topic Date Due  . TETANUS/TDAP  01/07/1975  . INFLUENZA VACCINE  01/24/2019  . Fecal DNA (Cologuard)  09/27/2019  . PAP SMEAR-Modifier  12/26/2019  . MAMMOGRAM  12/21/2020  . Hepatitis C Screening  Completed  . HIV Screening  Completed    Cancer Screenings: Lung: Low Dose CT Chest recommended if Age 75-80 years, 30 pack-year currently smoking OR have quit w/in 15years. Patient does not qualify. Breast:  Up to date on Mammogram? No   Up to date of Bone Density/Dexa? Yes Colorectal: Cologuard done last year  Additional Screenings:  Hepatitis C Screening:  Will have it done at the  cancer center      Plan:    I have personally reviewed and noted the following in the patient's chart:   . Medical and social history . Use of alcohol, tobacco or illicit drugs  . Current medications and supplements . Functional ability and status . Nutritional status . Physical activity . Advanced directives . List of other physicians . Hospitalizations, surgeries, and ER visits in previous 12 months . Vitals . Screenings to include cognitive, depression, and falls . Referrals and appointments  In addition, I have reviewed and discussed with patient certain preventive protocols, quality metrics, and best practice recommendations. A written personalized  care plan for preventive services as well as general preventive health recommendations were provided to patient.     Kayla Oyer Medina-Vargas, NP  04/02/2019

## 2019-04-03 ENCOUNTER — Inpatient Hospital Stay (HOSPITAL_BASED_OUTPATIENT_CLINIC_OR_DEPARTMENT_OTHER): Payer: Medicare HMO | Admitting: Adult Health

## 2019-04-03 ENCOUNTER — Encounter: Payer: Self-pay | Admitting: Adult Health

## 2019-04-03 ENCOUNTER — Other Ambulatory Visit: Payer: Self-pay | Admitting: Adult Health

## 2019-04-03 ENCOUNTER — Inpatient Hospital Stay: Payer: Medicare HMO

## 2019-04-03 ENCOUNTER — Other Ambulatory Visit: Payer: Self-pay

## 2019-04-03 VITALS — BP 112/74 | HR 88 | Temp 98.3°F | Resp 17 | Ht 67.0 in | Wt 222.1 lb

## 2019-04-03 DIAGNOSIS — Z5189 Encounter for other specified aftercare: Secondary | ICD-10-CM | POA: Diagnosis not present

## 2019-04-03 DIAGNOSIS — C771 Secondary and unspecified malignant neoplasm of intrathoracic lymph nodes: Secondary | ICD-10-CM | POA: Diagnosis not present

## 2019-04-03 DIAGNOSIS — Z95828 Presence of other vascular implants and grafts: Secondary | ICD-10-CM

## 2019-04-03 DIAGNOSIS — Z23 Encounter for immunization: Secondary | ICD-10-CM | POA: Diagnosis not present

## 2019-04-03 DIAGNOSIS — Z5111 Encounter for antineoplastic chemotherapy: Secondary | ICD-10-CM | POA: Diagnosis not present

## 2019-04-03 DIAGNOSIS — C50411 Malignant neoplasm of upper-outer quadrant of right female breast: Secondary | ICD-10-CM | POA: Diagnosis not present

## 2019-04-03 DIAGNOSIS — C50911 Malignant neoplasm of unspecified site of right female breast: Secondary | ICD-10-CM

## 2019-04-03 DIAGNOSIS — Z171 Estrogen receptor negative status [ER-]: Secondary | ICD-10-CM | POA: Diagnosis not present

## 2019-04-03 DIAGNOSIS — C773 Secondary and unspecified malignant neoplasm of axilla and upper limb lymph nodes: Secondary | ICD-10-CM | POA: Diagnosis not present

## 2019-04-03 DIAGNOSIS — C7801 Secondary malignant neoplasm of right lung: Secondary | ICD-10-CM

## 2019-04-03 DIAGNOSIS — C787 Secondary malignant neoplasm of liver and intrahepatic bile duct: Secondary | ICD-10-CM

## 2019-04-03 DIAGNOSIS — Z79899 Other long term (current) drug therapy: Secondary | ICD-10-CM | POA: Diagnosis not present

## 2019-04-03 DIAGNOSIS — C782 Secondary malignant neoplasm of pleura: Secondary | ICD-10-CM | POA: Diagnosis not present

## 2019-04-03 DIAGNOSIS — C7951 Secondary malignant neoplasm of bone: Secondary | ICD-10-CM | POA: Diagnosis not present

## 2019-04-03 DIAGNOSIS — Z1159 Encounter for screening for other viral diseases: Secondary | ICD-10-CM

## 2019-04-03 LAB — CBC WITH DIFFERENTIAL (CANCER CENTER ONLY)
Abs Immature Granulocytes: 2.43 10*3/uL — ABNORMAL HIGH (ref 0.00–0.07)
Basophils Absolute: 0.1 10*3/uL (ref 0.0–0.1)
Basophils Relative: 0 %
Eosinophils Absolute: 0.8 10*3/uL — ABNORMAL HIGH (ref 0.0–0.5)
Eosinophils Relative: 3 %
HCT: 35.6 % — ABNORMAL LOW (ref 36.0–46.0)
Hemoglobin: 11.7 g/dL — ABNORMAL LOW (ref 12.0–15.0)
Immature Granulocytes: 9 %
Lymphocytes Relative: 14 %
Lymphs Abs: 3.7 10*3/uL (ref 0.7–4.0)
MCH: 31 pg (ref 26.0–34.0)
MCHC: 32.9 g/dL (ref 30.0–36.0)
MCV: 94.4 fL (ref 80.0–100.0)
Monocytes Absolute: 3.8 10*3/uL — ABNORMAL HIGH (ref 0.1–1.0)
Monocytes Relative: 14 %
Neutro Abs: 16.3 10*3/uL — ABNORMAL HIGH (ref 1.7–7.7)
Neutrophils Relative %: 60 %
Platelet Count: 266 10*3/uL (ref 150–400)
RBC: 3.77 MIL/uL — ABNORMAL LOW (ref 3.87–5.11)
RDW: 15.9 % — ABNORMAL HIGH (ref 11.5–15.5)
WBC Count: 27.1 10*3/uL — ABNORMAL HIGH (ref 4.0–10.5)
nRBC: 0.5 % — ABNORMAL HIGH (ref 0.0–0.2)

## 2019-04-03 LAB — CMP (CANCER CENTER ONLY)
ALT: 20 U/L (ref 0–44)
AST: 39 U/L (ref 15–41)
Albumin: 3.7 g/dL (ref 3.5–5.0)
Alkaline Phosphatase: 124 U/L (ref 38–126)
Anion gap: 12 (ref 5–15)
BUN: 14 mg/dL (ref 8–23)
CO2: 26 mmol/L (ref 22–32)
Calcium: 9.7 mg/dL (ref 8.9–10.3)
Chloride: 101 mmol/L (ref 98–111)
Creatinine: 1.4 mg/dL — ABNORMAL HIGH (ref 0.44–1.00)
GFR, Est AFR Am: 46 mL/min — ABNORMAL LOW (ref >60)
GFR, Estimated: 40 mL/min — ABNORMAL LOW (ref >60)
Glucose, Bld: 94 mg/dL (ref 70–99)
Potassium: 3.7 mmol/L (ref 3.5–5.1)
Sodium: 139 mmol/L (ref 135–145)
Total Bilirubin: 0.2 mg/dL — ABNORMAL LOW (ref 0.3–1.2)
Total Protein: 8.2 g/dL — ABNORMAL HIGH (ref 6.5–8.1)

## 2019-04-03 LAB — TSH: TSH: 1.596 u[IU]/mL (ref 0.308–3.960)

## 2019-04-03 MED ORDER — SODIUM CHLORIDE 0.9% FLUSH
10.0000 mL | INTRAVENOUS | Status: DC | PRN
  Administered 2019-04-03: 10 mL
  Filled 2019-04-03: qty 10

## 2019-04-03 MED ORDER — HEPARIN SOD (PORK) LOCK FLUSH 100 UNIT/ML IV SOLN
500.0000 [IU] | Freq: Once | INTRAVENOUS | Status: DC
  Filled 2019-04-03: qty 5

## 2019-04-03 MED ORDER — PROCHLORPERAZINE MALEATE 10 MG PO TABS
10.0000 mg | ORAL_TABLET | Freq: Once | ORAL | Status: AC
  Administered 2019-04-03: 10 mg via ORAL

## 2019-04-03 MED ORDER — SODIUM CHLORIDE 0.9 % IV SOLN
Freq: Once | INTRAVENOUS | Status: AC
  Administered 2019-04-03: 14:00:00 via INTRAVENOUS
  Filled 2019-04-03: qty 250

## 2019-04-03 MED ORDER — SODIUM CHLORIDE 0.9% FLUSH
10.0000 mL | Freq: Once | INTRAVENOUS | Status: AC
  Administered 2019-04-03: 10 mL
  Filled 2019-04-03: qty 10

## 2019-04-03 MED ORDER — SODIUM CHLORIDE 0.9 % IV SOLN
1.2000 mg/m2 | Freq: Once | INTRAVENOUS | Status: AC
  Administered 2019-04-03: 2.75 mg via INTRAVENOUS
  Filled 2019-04-03: qty 5.5

## 2019-04-03 MED ORDER — PEGFILGRASTIM 6 MG/0.6ML ~~LOC~~ PSKT
6.0000 mg | PREFILLED_SYRINGE | Freq: Once | SUBCUTANEOUS | Status: AC
  Administered 2019-04-03: 6 mg via SUBCUTANEOUS

## 2019-04-03 MED ORDER — INFLUENZA VAC SPLIT QUAD 0.5 ML IM SUSY
0.5000 mL | PREFILLED_SYRINGE | Freq: Once | INTRAMUSCULAR | Status: AC
  Administered 2019-04-03: 0.5 mL via INTRAMUSCULAR

## 2019-04-03 MED ORDER — PROCHLORPERAZINE MALEATE 10 MG PO TABS
ORAL_TABLET | ORAL | Status: AC
  Filled 2019-04-03: qty 1

## 2019-04-03 MED ORDER — PEGFILGRASTIM 6 MG/0.6ML ~~LOC~~ PSKT
PREFILLED_SYRINGE | SUBCUTANEOUS | Status: AC
  Filled 2019-04-03: qty 0.6

## 2019-04-03 MED ORDER — INFLUENZA VAC SPLIT QUAD 0.5 ML IM SUSY
PREFILLED_SYRINGE | INTRAMUSCULAR | Status: AC
  Filled 2019-04-03: qty 0.5

## 2019-04-03 MED ORDER — HEPARIN SOD (PORK) LOCK FLUSH 100 UNIT/ML IV SOLN
500.0000 [IU] | Freq: Once | INTRAVENOUS | Status: AC | PRN
  Administered 2019-04-03: 500 [IU]
  Filled 2019-04-03: qty 5

## 2019-04-03 NOTE — Patient Instructions (Signed)
[facility] Discharge Instructions for Patients Receiving Chemotherapy  Today you received the following chemotherapy agents [place].   To help prevent nausea and vomiting after your treatment, we encourage you to take your nausea medication as directed  If you develop nausea and vomiting that is not controlled by your nausea medication, call the clinic.   BELOW ARE SYMPTOMS THAT SHOULD BE REPORTED IMMEDIATELY:  *FEVER GREATER THAN 100.5 F  *CHILLS WITH OR WITHOUT FEVER  NAUSEA AND VOMITING THAT IS NOT CONTROLLED WITH YOUR NAUSEA MEDICATION  *UNUSUAL SHORTNESS OF BREATH  *UNUSUAL BRUISING OR BLEEDING  TENDERNESS IN MOUTH AND THROAT WITH OR WITHOUT PRESENCE OF ULCERS  *URINARY PROBLEMS  *BOWEL PROBLEMS  UNUSUAL RASH Items with * indicate a potential emergency and should be followed up as soon as possible.  Feel free to call the clinic you have any questions or concerns. The clinic phone number is [phone].  Pegfilgrastim injection What is this medicine? PEGFILGRASTIM (PEG fil gra stim) is a long-acting granulocyte colony-stimulating factor that stimulates the growth of neutrophils, a type of white blood cell important in the body's fight against infection. It is used to reduce the incidence of fever and infection in patients with certain types of cancer who are receiving chemotherapy that affects the bone marrow, and to increase survival after being exposed to high doses of radiation. This medicine may be used for other purposes; ask your health care provider or pharmacist if you have questions. COMMON BRAND NAME(S): Steve Rattler, Ziextenzo What should I tell my health care provider before I take this medicine? They need to know if you have any of these conditions:  kidney disease  latex allergy  ongoing radiation therapy  sickle cell disease  skin reactions to acrylic adhesives (On-Body Injector only)  an unusual or allergic reaction  to pegfilgrastim, filgrastim, other medicines, foods, dyes, or preservatives  pregnant or trying to get pregnant  breast-feeding How should I use this medicine? This medicine is for injection under the skin. If you get this medicine at home, you will be taught how to prepare and give the pre-filled syringe or how to use the On-body Injector. Refer to the patient Instructions for Use for detailed instructions. Use exactly as directed. Tell your healthcare provider immediately if you suspect that the On-body Injector may not have performed as intended or if you suspect the use of the On-body Injector resulted in a missed or partial dose. It is important that you put your used needles and syringes in a special sharps container. Do not put them in a trash can. If you do not have a sharps container, call your pharmacist or healthcare provider to get one. Talk to your pediatrician regarding the use of this medicine in children. While this drug may be prescribed for selected conditions, precautions do apply. Overdosage: If you think you have taken too much of this medicine contact a poison control center or emergency room at once. NOTE: This medicine is only for you. Do not share this medicine with others. What if I miss a dose? It is important not to miss your dose. Call your doctor or health care professional if you miss your dose. If you miss a dose due to an On-body Injector failure or leakage, a new dose should be administered as soon as possible using a single prefilled syringe for manual use. What may interact with this medicine? Interactions have not been studied. Give your health care provider a list of all  the medicines, herbs, non-prescription drugs, or dietary supplements you use. Also tell them if you smoke, drink alcohol, or use illegal drugs. Some items may interact with your medicine. This list may not describe all possible interactions. Give your health care provider a list of all the  medicines, herbs, non-prescription drugs, or dietary supplements you use. Also tell them if you smoke, drink alcohol, or use illegal drugs. Some items may interact with your medicine. What should I watch for while using this medicine? You may need blood work done while you are taking this medicine. If you are going to need a MRI, CT scan, or other procedure, tell your doctor that you are using this medicine (On-Body Injector only). What side effects may I notice from receiving this medicine? Side effects that you should report to your doctor or health care professional as soon as possible:  allergic reactions like skin rash, itching or hives, swelling of the face, lips, or tongue  back pain  dizziness  fever  pain, redness, or irritation at site where injected  pinpoint red spots on the skin  red or dark-brown urine  shortness of breath or breathing problems  stomach or side pain, or pain at the shoulder  swelling  tiredness  trouble passing urine or change in the amount of urine Side effects that usually do not require medical attention (report to your doctor or health care professional if they continue or are bothersome):  bone pain  muscle pain This list may not describe all possible side effects. Call your doctor for medical advice about side effects. You may report side effects to FDA at 1-800-FDA-1088. Where should I keep my medicine? Keep out of the reach of children. If you are using this medicine at home, you will be instructed on how to store it. Throw away any unused medicine after the expiration date on the label. NOTE: This sheet is a summary. It may not cover all possible information. If you have questions about this medicine, talk to your doctor, pharmacist, or health care provider.  2020 Elsevier/Gold Standard (2017-09-16 16:57:08)

## 2019-04-03 NOTE — Progress Notes (Signed)
[place]  [phone] [phone]) [phone]    ID: Kayla Price DOB: [birth year]  MR#: [id number]  XQJ#:[id number]  Patient Care Team: Lauree Chandler, NP as PCP - General (Nurse Practitioner) Tommy Medal, Lavell Islam, MD as PCP - Infectious Diseases (Infectious Diseases) Clent Jacks, MD as Consulting Physician (Ophthalmology) Renato Shin, MD as Consulting Physician (Endocrinology) Frederik Pear, MD as Consulting Physician (Orthopedic Surgery) Magrinat, Virgie Dad, MD as Consulting Physician (Oncology) Fanny Skates, MD as Consulting Physician (General Surgery) Donnamae Jude, MD as Consulting Physician (Obstetrics and Gynecology) Delice Bison, Charlestine Massed, NP as Nurse Practitioner (Hematology and Oncology) Webb Laws, [place] as Referring Physician (Optometry) OTHER MD:   CHIEF COMPLAINT: Triple negative breast cancer, recurrent  CURRENT TREATMENT:  eribulin; Zometa  INTERVAL HISTORY: Eyva returns today for follow-up and treatment of her recurrent triple negative breast cancer.  She is receiving Eribulin given every two weeks.  Santiana notes that she is tolerating this well.  She is here for her ninth dose of treatment.    Caroline's last PET in September, identified areas in the bone for progression.  She started Zometa two weeks ago, and noted increased bone pain and fatigue.  She has been doing better since then.  REVIEW OF SYSTEMS: Selen is doing well today.  She has no new issues.  She wants to know when she can receive a flu shot.  She is otherwise feeling well and has no issues such as new pain, fever, chills, chest pain, palpitations, cough, shortness of breath, headaches, nausea, vomiting, bowel/bladder changes.  A detailed ROS was otherwise non contributory.     BREAST CANCER HISTORY: From the original intake note:  Jourdan is a history of right-sided breast cancer dating back to 2005. She had a lumpectomy with  sentinel lymph node sampling, chemotherapy, and radiation. I do not have access to those records at present.  More recently she had bilateral screening mammography at the [facility] 09/25/2016 showing a possible mass in the right breast. Diagnostic mammography with ultrasonography on 09/28/2016 the patient underwent right diagnostic mammography with tomography and right breast ultrasonography. The breast density was category A. In the right breast at the 10:00 position there was an irregular mass measuring 2.5 cm. Ultrasound identified this the 10:00 radiant 10 cm from the nipple measuring 2.4 cm. In the right axilla there was an abnormal lymph node measuring 1.3 cm with other normal-appearing lymph nodes.  On 10/01/2016 she  underwent biopsy of the right breast mass in question as well as the suspicious axillary lymph node. 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 The proliferation marker was 70% in the breast lesion and 50% in the lymph node.  Her subsequent history is as detailed below.   MEDICAL HISTORY: Past Medical History:  Diagnosis Date   Alopecia areata 11/28/2009   Bell's palsy    Cancer ([place]) 2005   Breast cancer   chemotherapy and radiation   CKD (chronic kidney disease) stage 3, GFR 30-59 ml/min 06/08/2015   Dry eye syndrome    Family history of lung cancer    Family history of non-Hodgkin's lymphoma    Fasting hyperglycemia    Gestational diabetes    2001   HIP FRACTURE, RIGHT 05/06/2008   History of kidney stones    HIV DISEASE 03/27/2006   HYPERLIPIDEMIA, MIXED 12/15/2007   HYPERTENSION 03/27/2006   HYPOTHYROIDISM,  POST-RADIATION 06/28/2008   MENORRHAGIA, POSTMENOPAUSAL 02/03/2009   Osteoarthritis of left knee 06/08/2015   OSTEOARTHROSIS, LOCAL, SCND, UNSPC SITE 04/07/2007   Personal history of  chemotherapy 11/2018   PVD 04/07/2007   Tinea capitis    TRIGGER FINGER 05/06/2008   Unspecified vitamin D deficiency 08/06/2007    ALLERGIES:  is allergic to lisinopril; pepcid [famotidine]; prilosec [omeprazole]; and tums [calcium carbonate antacid].  MEDICATIONS:  Current Outpatient Medications  Medication Sig Dispense Refill   ELDERBERRY PO Take 50 mg by mouth daily.     HYDROcodone-acetaminophen (NORCO/VICODIN) 5-325 MG tablet TAKE ONE TABLET 4 TIMES A DAY AS INSTRUCTED 120 tablet 0   hydrocortisone cream 1 % Apply 1 application topically daily as needed for itching.     JULUCA 50-25 MG TABS TAKE 1 TABLET BY MOUTH DAILY WITH BREAKFAST. TAKE WITH THE PREZCOBIX. 30 tablet 5   levothyroxine (SYNTHROID) 200 MCG tablet Take 1 tablet (200 mcg total) by mouth daily before breakfast. 90 tablet 1   lidocaine-prilocaine (EMLA) cream APPLY 1 APPLICATION TOPICALLY AS NEEDED. APPLY TO PORT SITE 1 HOUR PRIOR TO ACCESS 30 g 0   Loratadine 10 MG CAPS Take by mouth as needed.     Menthol, Topical Analgesic, (BENGAY EX) Apply 1 application topically daily as needed (muscle pain).     methocarbamol (ROBAXIN) 500 MG tablet Take 1 tablet (500 mg total) by mouth every 8 (eight) hours as needed for muscle spasms. 90 tablet 1   olmesartan-hydrochlorothiazide (BENICAR HCT) 40-25 MG tablet TAKE 1 TABLET BY MOUTH DAILY. 30 tablet 1   PREZCOBIX 800-150 MG tablet TAKE 1 TABLET BY MOUTH DAILY. SWALLOW WHOLE. DO NOT CRUSH, BREAK OR CHEW TABLETS. TAKE WITH FOOD. 30 tablet 5   Propylene Glycol (SYSTANE BALANCE OP) Place 1 drop into both eyes 4 (four) times daily as needed.     SELZENTRY 150 MG tablet TAKE ONE TABLET BY MOUTH TWICE DAILY 60 tablet 5   Study - REPRIEVE [phone] - pitavastatin 4 mg or placebo tablet (PI-[place]) Take 1 tablet (4 mg total) by mouth daily. 30 tablet    No current facility-administered medications for this visit.    Facility-Administered Medications Ordered in Other Visits    Medication Dose Route Frequency Provider Last Rate Last Dose   heparin lock flush 100 unit/mL  500 Units Intracatheter Once PRN Magrinat, Virgie Dad, MD       sodium chloride flush (NS) 0.9 % injection 10 mL  10 mL Intracatheter PRN Magrinat, Virgie Dad, MD        SURGICAL HISTORY:  Past Surgical History:  Procedure Laterality Date   BREAST LUMPECTOMY Right    2005   CHEST TUBE INSERTION Right 05/30/2018   Procedure: INSERTION PLEURAL DRAINAGE CATHETER;  Surgeon: Grace Isaac, MD;  Location: [facility];  Service: Thoracic;  Laterality: Right;   COLONOSCOPY     COLONOSCOPY WITH ESOPHAGOGASTRODUODENOSCOPY (EGD)     ENDOBRONCHIAL ULTRASOUND Bilateral 05/15/2018   Procedure: ENDOBRONCHIAL ULTRASOUND;  Surgeon: Collene Gobble, MD;  Location: [facility];  Service: Cardiopulmonary;  Laterality: Bilateral;   FINE NEEDLE ASPIRATION BIOPSY  05/15/2018   Procedure: FINE NEEDLE ASPIRATION BIOPSY;  Surgeon: Collene Gobble, MD;  Location: [facility];  Service: Cardiopulmonary;;   FLEXIBLE BRONCHOSCOPY  05/15/2018   Procedure: FLEXIBLE BRONCHOSCOPY;  Surgeon: Collene Gobble, MD;  Location: [facility];  Service: Cardiopulmonary;;   HYSTEROSCOPY  2006   IR IMAGING GUIDED PORT INSERTION  07/14/2018   IR THORACENTESIS ASP PLEURAL SPACE W/IMG  GUIDE  04/14/2018   IR THORACENTESIS ASP PLEURAL SPACE W/IMG GUIDE  04/28/2018   KNEE ARTHROSCOPY Left 2002   LYMPH NODE DISSECTION  2005   MASTECTOMY Right    MASTECTOMY MODIFIED RADICAL Right 11/05/2016   Procedure: RIGHT MASTECTOMY MODIFIED RADICAL;  Surgeon: Fanny Skates, MD;  Location: [facility];  Service: General;  Laterality: Right;   MODIFIED RADICAL MASTECTOMY Right 11/05/2016   placement   of port-a-cath     PLEURAL BIOPSY Right 05/30/2018   Procedure: PLEURAL BIOPSY;  Surgeon: Grace Isaac, MD;  Location: [facility];  Service: Thoracic;  Laterality: Right;   PLEURAL EFFUSION DRAINAGE Right 05/30/2018   Procedure: DRAINAGE OF  PLEURAL EFFUSION;  Surgeon: Grace Isaac, MD;  Location: [facility];  Service: Thoracic;  Laterality: Right;   PORT-A-CATH REMOVAL  2006   insertion 2005   PORT-A-CATH REMOVAL N/A 03/29/2017   Procedure: REMOVAL PORT-A-CATH;  Surgeon: Fanny Skates, MD;  Location: [facility];  Service: General;  Laterality: N/A;   PORTACATH PLACEMENT Right 11/05/2016   Procedure: INSERTION PORT-A-CATH WITH ULTRA SOUND;  Surgeon: Fanny Skates, MD;  Location: [facility];  Service: General;  Laterality: Right;   removal of port a cath  12/2014   TALC PLEURODESIS Right 05/30/2018   Procedure: Pietro Cassis;  Surgeon: Grace Isaac, MD;  Location: [facility];  Service: Thoracic;  Laterality: Right;   THYROID SURGERY  2009   Ablation    TOTAL KNEE ARTHROPLASTY Left 02/06/2016   Procedure: TOTAL KNEE ARTHROPLASTY;  Surgeon: Frederik Pear, MD;  Location: [facility];  Service: Orthopedics;  Laterality: Left;   TUBAL LIGATION     VIDEO ASSISTED THORACOSCOPY Right 05/30/2018   Procedure: VIDEO ASSISTED THORACOSCOPY;  Surgeon: Grace Isaac, MD;  Location: [facility];  Service: Thoracic;  Laterality: Right;    GYNECOLOGIC HISTORY:  Patient's last menstrual period was 11/06/2003.  menarche age 57, first live birth age 63, the patient is [place] P2. She stopped having periods in 2005, with her chemotherapy; she never took hormone replacement    SOCIAL HISTORY:  Quanika worked for the [employer] more than 20 years. 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 At home she lives with her daughter Orland Dec who works for [employer] and her granddaughter Donita Brooks, 64 y/o as of APRIL 2018.  The patient's son also drops infrequently.  They are all careful regarding pandemic precautions                          ADVANCED DIRECTIVES:  not in place   PHYSICAL EXAMINATION: Today's Vitals   04/03/19 1327  BP: 112/74  Pulse: 88  Resp: 17  Temp: 98.3 F (36.8 C)  TempSrc: Temporal  SpO2: 97%   Weight: 222 lb 1.6 oz (100.7 kg)  Height: _0  (1.702 m)   Body mass index is 34.79 kg/m. ECOG PERFORMANCE STATUS: 1 - Symptomatic but completely ambulatory GENERAL: Patient is a well appearing female in no acute distress HEENT:  Oropharynx clear and moist. No ulcerations or evidence of oropharyngeal candidiasis. Neck is supple.  NODES:  No cervical, supraclavicular, or axillary lymphadenopathy palpated.  BREAST EXAM:  s/p right mastectomy, no sign of local recurrence, left breast benign LUNGS:  Slightly diminished in right lower lobe, otherwise clear throughout.  No wheezes or rhonchi. HEART:  Regular rate and rhythm. No murmur appreciated. ABDOMEN:  Soft, nontender. normoactive bowel sounds. No organomegaly palpated. MSK:  No focal spinal tenderness to palpation. Full range of motion bilaterally in the upper extremities. EXTREMITIES:  No peripheral edema.   SKIN:  Clear with no obvious rashes or skin changes. No nail dyscrasia. NEURO:  Nonfocal. Well oriented.  Appropriate affect.  LABORATORY DATA: Lab Results  Component Value Date   WBC 27.1 (H) 04/03/2019   HGB 11.7 (L) 04/03/2019   HCT 35.6 (L) 04/03/2019   MCV 94.4 04/03/2019   PLT 266 04/03/2019      Chemistry      Component Value Date/Time   NA 139 04/03/2019 1258   NA 140 05/13/2017 0927   K 3.7 04/03/2019 1258   K 4.1 05/13/2017 0927   CL 101 04/03/2019 1258   CO2 26 04/03/2019 1258   CO2 29 05/13/2017 0927   BUN 14 04/03/2019 1258   BUN 17.7 05/13/2017 0927   CREATININE 1.40 (H) 04/03/2019 1258   CREATININE 1.62 (H) 09/18/2018 0911   CREATININE 1.4 (H) 05/13/2017 0927   GLU 104 02/13/2016 1358      Component Value Date/Time   CALCIUM 9.7 04/03/2019 1258   CALCIUM 10.5 (H) 05/13/2017 0927   ALKPHOS 124 04/03/2019 1258   ALKPHOS 90 05/13/2017 0927   AST 39 04/03/2019 1258   AST 14 05/13/2017 0927   ALT 20 04/03/2019 1258   ALT 12 05/13/2017 0927   BILITOT 0.2 (L) 04/03/2019 1258   BILITOT 0.27  05/13/2017 0927       RADIOGRAPHIC STUDIES:  Nm Pet Image Restag (ps) Skull Base To Thigh  Result Date: 03/12/2019 CLINICAL DATA:  Subsequent treatment strategy for metastatic breast cancer. EXAM: NUCLEAR MEDICINE PET SKULL BASE TO THIGH TECHNIQUE: 11.7 mCi F-18 FDG was injected intravenously. Full-ring PET imaging was performed from the skull base to thigh after the radiotracer. CT data was obtained and used for attenuation correction and anatomic localization. Fasting blood glucose: 93 mg/dl COMPARISON:  Multiple exams, including 11/28/2018 FINDINGS: Mediastinal blood pool activity: SUV max 2.0 Liver activity: SUV max 2.7 NECK: No significant abnormal hypermetabolic activity in this region. Incidental CT findings: Mucous retention cyst in the left maxillary sinus. CHEST: Overall mild improvement in adenopathy and pleural tumor compared to prior. A lower paratracheal node anterior to the carina measures 1.6 cm in short axis on image 60/4 (formerly 2.6 cm) with maximum SUV 7.6 (formerly 15.6). The right middle lobe mass is primarily photopenic today, with loss of much of the peripheral activity along this mass. It is difficult to measure the signal intensity of this mass separate from the surrounding pleural metastatic rind, but posterior portions the mass maximum SUV of about 9.3, formerly 22.4. The centrally necrotic portion of the mass is hypodense and measures about 3.8 cm in diameter, formerly 3.1 cm, indicating increased central necrosis. The pleural rind tumor demonstrates reduced thickness and reduced metabolic activity. Posterior pleural tumor just below the level of the carina has maximum SUV of 10.9, formerly 14.7. Although the trend is generally improvement, there are several lymph nodes which are small but have increased metabolic activity compared to the prior exam. For example, a pericardial lymph node measuring 0.7 cm in short axis on image 62/4 (formerly the same) has a maximum SUV of 6.0  today, previously 4.6. A prevascular node measuring 1.1 cm in short axis on image 55/4 (formerly 0.9 cm) has a maximum SUV of 6.3, formerly 3.7. Incidental CT findings: Right Port-A-Cath tip: Right atrium. Atherosclerotic aortic arch. Moderate cardiomegaly. ABDOMEN/PELVIS: Previous gastrohepatic ligament adenopathy is indistinct today but  measures about 1.0 cm in short axis on image 94/4 (formerly 1.7 cm) with maximum SUV 1.8 (formerly 9.2). A previously mildly hypermetabolic lymph node near the apex of the left adrenal gland is no longer readily apparent. There continues to be some mildly hypermetabolic retroperitoneal adenopathy, with a left para-aortic lymph node measuring 0.9 cm in short axis on image 114/4 (formerly 0.9 cm) with maximum SUV 9.7 (formerly 12.8). Incidental CT findings: Small dependent gallstones in the gallbladder. Mild sigmoid colon diverticulosis. SKELETON: Progressive hypermetabolic activity and progressive lytic lesions eccentric to the right in the T1 vertebral body and posterior elements, maximum SUV 8.9, previously 7.2. New lytic metastatic lesions including in the right skull base just lateral to the foramen magnum; at L5; in both iliac bones; and in both femoral heads. The left eccentric lesion in the L5 vertebral body has maximum standard uptake value of 8.8; the right femoral head lesion has maximum SUV of 10.9. Incidental CT findings: Grade 1 anterolisthesis at L4-5 and L5-S1. IMPRESSION: 1. Mixed appearance, with generalized mild to moderate improvement in thoracic and upper abdominal adenopathy and soft tissue metastatic lesions including the right pleural space; but with multiple new osseous metastatic lesions identified. 2. Other imaging findings of potential clinical significance: Search after thorough moderate cardiomegaly. Chronic left maxillary sinusitis. Cholelithiasis. Sigmoid colon diverticulosis. Electronically Signed   By: Van Clines M.D.   On: 03/12/2019 07:57       ASSESSMENT: 63 y.o. [place] woman   (1) status post right lumpectomy and sentinel lymph node sampling October 2005 for a 0.6 cm invasive ductal carcinoma involving one out of 2 sentinel lymph nodes sampled, grade 3, triple-negative, treated adjuvantly with doxorubicin and cyclophosphamide 4 followed by weekly paclitaxel 7, followed by adjuvant radiation  RECURRENT DISEASE: (2) status post right breast upper outer quadrant biopsy and right axillary lymph node biopsy 10/01/2016, both positive for a T2 N1, stage IIIB invasive ductal carcinoma, triple negative, with an MIB-1 of 50-70%  (3) status post right modified radical mastectomy 11/05/2016 showing a pT2 pN1, stage IIIB invasive ductal carcinoma, grade 3, triple negative, with negative margins  (4) not a candidate for radiation given prior history  (5) adjuvant chemotherapy consisting of carboplatin and gemcitabine given days 1 and 8 of each 21 day cycle, for 6 cycles, starting 11/20/2016, completed 03/11/2017             (a) day 8 cycle 2 omitted because of neutropenia; Neupogen/Neulasta added  (5) HIV positivity: under care of Id Lucianne [facility])  (6) Genetic testing 06/17/2017: no pathogenicmutations.Genes tested: APC, ATM, AXIN2, BARD1, BLM, BMPR1A, BRCA1, BRCA2, BRIP1, CDH1, CDK4, CDKN2A (p14ARF), CDKN2A (p16INK4a), CEBPA, CHEK2, CTNNA1, DICER1, EPCAM*, GATA2, GREM1*, HRAS, KIT, MEN1, MLH1, MSH2, MSH3, MSH6, MUTYH, NBN, NF1, PALB2, PDGFRA, PMS2, POLD1, POLE, PTEN, RAD50, RAD51C, RAD51D, RUNX1, SDHB, SDHC, SDHD, SMAD4, SMARCA4, STK11, TERC, TERT, TP53, TSC1, TSC2, VHL.The following genes were evaluated for sequence changes only: HOXB13*, NTHL1*, SDHA.              (a) A variant of uncertain significance (VUS)in a gene calledNTHL1was also noted.[id number] (p.[id number])  METASTATIC DISEASE: November 2019 (1) Patient seen in urgent care and ultimately ED on 10/21 for shortness of breath, chest xray demonstrated Right pleural  effusion.   (a) right thoracenteses on 10/21 and 11/4 results show atypical cells, non diagnostic (b) CT chest 04/22/2018 shows re-accumulation of fluid, and right pleural nodularity. (c) PET scan on 05/01/2018 shows hypermetabolic pleural based metastases, right CP angle nodal metastases,  no evidence of malignancy in abdomen and pelvis. (d) bronchoscopy with biopsy by Dr. Lamonte Sakai and BAL on 05/15/2018 was non diagnostic             (e) VAT biopsy of the right pleura 05/30/2018           confirms carcinoma, again triple negative             (f) [facility] shows PD-L1 positive (1% in [facility]); otherwise microsatellite stable, TMB low (5/Mb), no PIK3 mutations; other mutations suggest sensitivity to MTOR inhibitors and several TKIs  (2) Atezolizumab started 07/15/2018 given every other week             (a) PET 08/13/2018 shows tumor Right hemithorax (new baseline study)             (b) Atezo changed to Q4w starting with 11/07/2018 dose             (c) PET 11/28/2018 documents progression in the right lung and pleural area as well as lymph nodes, and a T1 skeletal metastasis             (d) atezolizumab discontinued after 12/05/2018 dose  (3) eribulin day 1 and 8 of every 21 day cycle started 12/19/2018             (a) changed to every other week with Neulsta support due to neutropenia causing treatment delays and her h/o HIV (delays due to insurance denial of onpro after 02/20/2019 dose)  (b) Zometa to be given every 4 weeks starting on 03/25/2019  PLAN:  Tayana is doing well today.  Her labs are stable, and she will proceed with treatment today with Eribulin.  She is receiving Onpro and we were able to get this approved with the help of pharmacy.  She is tolerating treatment well and she has no clinical signs of progression today.    I talked with Dr. Jana Hakim about when Leotha should get her flu shot considering her chemotherapy regimen in every 2 weeks.  She can receive it today or in 2 more  weeks in treatment.    Bryant will return in 2 weeks for labs and Eribulin, and we will see her in 4 weeks with her labs and eribulin.   She was recommended to continue with the appropriate pandemic precautions. She knows to call for any questions that may arise between now and her next appointment.  We are happy to see her sooner if needed.   A total of (20) minutes of face-to-face time was spent with this patient with greater than 50% of that time in counseling and care-coordination.   Wilber Bihari, NP 04/03/19

## 2019-04-04 LAB — CANCER ANTIGEN 27.29: CA 27.29: 61.7 U/mL — ABNORMAL HIGH (ref 0.0–38.6)

## 2019-04-04 NOTE — Patient Instructions (Signed)

## 2019-04-04 NOTE — Progress Notes (Signed)
Location:  [place] [facility]  Provider: Durenda Age - NP  Code Status: Full Code  Goals of Care:  Advanced Directives 10/17/2018  Does Patient Have a Medical Advance Directive? No  Type of Advance Directive -  Does patient want to make changes to medical advance directive? -  Copy of [place] in Chart? -  Would patient like information on creating a medical advance directive? Yes (MAU/Ambulatory/Procedural Areas - Information given)     Chief Complaint  Patient presents with   Medical Management of Chronic Issues    6 mo f/u    HPI: Patient is a 63 y.o. female seen today for medical management of chronic diseases. She has PMH of Bell's palsy, hypertension, mixed hyperlipidemia, hypothyroidism, HIV disease and breast cancer.  She is currently receiving infusion (Eribulin) every 2 weeks. She is also being treated with Zometa since PET scan done last September showing areas in the bone for progression. Today, she complained of having right shoulder pain for which she takes Norco. 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 Vaccinations deferred today and patient will inquire during follow-up with oncology tomorrow. BP today 120/86, take Benicar HCT daily. She takes Levothyroxine for her hypothyroidism and latest tsh 0.841 (02/20/19).  Past Medical History:  Diagnosis Date   Alopecia areata 11/28/2009   Bell's palsy    Cancer ([place]) 2005   Breast cancer   chemotherapy and radiation   CKD (chronic kidney disease) stage 3, GFR 30-59 ml/min 06/08/2015   Dry eye syndrome    Family history of lung cancer    Family history of non-Hodgkin's lymphoma    Fasting hyperglycemia    Gestational diabetes    2001   HIP FRACTURE, RIGHT 05/06/2008   History of kidney stones    HIV DISEASE 03/27/2006   HYPERLIPIDEMIA, MIXED 12/15/2007   HYPERTENSION 03/27/2006   HYPOTHYROIDISM, POST-RADIATION 06/28/2008   MENORRHAGIA, POSTMENOPAUSAL 02/03/2009   Osteoarthritis of  left knee 06/08/2015   OSTEOARTHROSIS, LOCAL, SCND, UNSPC SITE 04/07/2007   Personal history of chemotherapy 11/2018   PVD 04/07/2007   Tinea capitis    TRIGGER FINGER 05/06/2008   Unspecified vitamin D deficiency 08/06/2007    Past Surgical History:  Procedure Laterality Date   BREAST LUMPECTOMY Right    2005   CHEST TUBE INSERTION Right 05/30/2018   Procedure: INSERTION PLEURAL DRAINAGE CATHETER;  Surgeon: Grace Isaac, MD;  Location: [facility] OR;  Service: Thoracic;  Laterality: Right;   COLONOSCOPY     COLONOSCOPY WITH ESOPHAGOGASTRODUODENOSCOPY (EGD)     ENDOBRONCHIAL ULTRASOUND Bilateral 05/15/2018   Procedure: ENDOBRONCHIAL ULTRASOUND;  Surgeon: Collene Gobble, MD;  Location: [facility];  Service: Cardiopulmonary;  Laterality: Bilateral;   FINE NEEDLE ASPIRATION BIOPSY  05/15/2018   Procedure: FINE NEEDLE ASPIRATION BIOPSY;  Surgeon: Collene Gobble, MD;  Location: [facility];  Service: Cardiopulmonary;;   FLEXIBLE BRONCHOSCOPY  05/15/2018   Procedure: FLEXIBLE BRONCHOSCOPY;  Surgeon: Collene Gobble, MD;  Location: [facility];  Service: Cardiopulmonary;;   HYSTEROSCOPY  2006   IR IMAGING GUIDED PORT INSERTION  07/14/2018   IR THORACENTESIS ASP PLEURAL SPACE W/IMG GUIDE  04/14/2018   IR THORACENTESIS ASP PLEURAL SPACE W/IMG GUIDE  04/28/2018   KNEE ARTHROSCOPY Left 2002   LYMPH NODE DISSECTION  2005   MASTECTOMY Right    MASTECTOMY MODIFIED RADICAL Right 11/05/2016   Procedure: RIGHT MASTECTOMY MODIFIED RADICAL;  Surgeon: Fanny Skates, MD;  Location: [place];  Service: General;  Laterality: Right;   MODIFIED  RADICAL MASTECTOMY Right 11/05/2016   placement   of port-a-cath     PLEURAL BIOPSY Right 05/30/2018   Procedure: PLEURAL BIOPSY;  Surgeon: Grace Isaac, MD;  Location: [place];  Service: Thoracic;  Laterality: Right;   PLEURAL EFFUSION DRAINAGE Right 05/30/2018   Procedure: DRAINAGE OF PLEURAL EFFUSION;  Surgeon: Grace Isaac, MD;   Location: [facility] OR;  Service: Thoracic;  Laterality: Right;   PORT-A-CATH REMOVAL  2006   insertion 2005   PORT-A-CATH REMOVAL N/A 03/29/2017   Procedure: REMOVAL PORT-A-CATH;  Surgeon: Fanny Skates, MD;  Location: [place];  Service: General;  Laterality: N/A;   PORTACATH PLACEMENT Right 11/05/2016   Procedure: INSERTION PORT-A-CATH WITH ULTRA SOUND;  Surgeon: Fanny Skates, MD;  Location: [place];  Service: General;  Laterality: Right;   removal of port a cath  12/2014   TALC PLEURODESIS Right 05/30/2018   Procedure: Pietro Cassis;  Surgeon: Grace Isaac, MD;  Location: [place];  Service: Thoracic;  Laterality: Right;   THYROID SURGERY  2009   Ablation    TOTAL KNEE ARTHROPLASTY Left 02/06/2016   Procedure: TOTAL KNEE ARTHROPLASTY;  Surgeon: Frederik Pear, MD;  Location: [place];  Service: Orthopedics;  Laterality: Left;   TUBAL LIGATION     VIDEO ASSISTED THORACOSCOPY Right 05/30/2018   Procedure: VIDEO ASSISTED THORACOSCOPY;  Surgeon: Grace Isaac, MD;  Location: [place];  Service: Thoracic;  Laterality: Right;    Allergies  Allergen Reactions   Lisinopril Anaphylaxis and Swelling    Swelling of tongue and mouth 11/05/16- tolerates Olmesartan   Pepcid [Famotidine] Other (See Comments)    PPI H2, BLOCKERS LOWER GASTRIC PH WHICH WOULD LEAD TO SUBTHERAPEUTIC RILPIVIRINE LEVELS AND POTENTIAL VIROLOGICAL FAILURE WITH RESISTANCE   Prilosec [Omeprazole] Other (See Comments)    PPI H2, BLOCKERS LOWER GASTRIC PH WHICH WOULD LEAD TO SUBTHERAPEUTIC RILPIVIRINE LEVELS AND POTENTIAL VIROLOGICAL FAILURE WITH RESISTANCE   Tums [Calcium Carbonate Antacid] Other (See Comments)    TUMS ANTACIDS CAN LOWER GASTRIC PH WHICH COULD  LEAD TO SUBTHERAPEUTIC RILPIVIRINE LEVELS AND POTENTIAL VIROLOGICAL FAILURE WITH RESISTANCE TUMS CAN BE GIVEN BUT NEED CONSULT WITH [facility] RE TIMING. I PREFER HER TO AVOID ALL TOGETHER    Outpatient Encounter Medications as of 04/02/2019  Medication Sig    ELDERBERRY PO Take 50 mg by mouth daily.   HYDROcodone-acetaminophen (NORCO/VICODIN) 5-325 MG tablet TAKE ONE TABLET 4 TIMES A DAY AS INSTRUCTED   hydrocortisone cream 1 % Apply 1 application topically daily as needed for itching.   JULUCA 50-25 MG TABS TAKE 1 TABLET BY MOUTH DAILY WITH BREAKFAST. TAKE WITH THE PREZCOBIX.   levothyroxine (SYNTHROID) 200 MCG tablet Take 1 tablet (200 mcg total) by mouth daily before breakfast.   lidocaine-prilocaine (EMLA) cream APPLY 1 APPLICATION TOPICALLY AS NEEDED. APPLY TO PORT SITE 1 HOUR PRIOR TO ACCESS   Loratadine 10 MG CAPS Take by mouth as needed.   Menthol, Topical Analgesic, (BENGAY EX) Apply 1 application topically daily as needed (muscle pain).   methocarbamol (ROBAXIN) 500 MG tablet Take 1 tablet (500 mg total) by mouth every 8 (eight) hours as needed for muscle spasms.   olmesartan-hydrochlorothiazide (BENICAR HCT) 40-25 MG tablet TAKE 1 TABLET BY MOUTH DAILY.   PREZCOBIX 800-150 MG tablet TAKE 1 TABLET BY MOUTH DAILY. SWALLOW WHOLE. DO NOT CRUSH, BREAK OR CHEW TABLETS. TAKE WITH FOOD.   Propylene Glycol (SYSTANE BALANCE OP) Place 1 drop into both eyes 4 (four) times daily as needed.   SELZENTRY 150  MG tablet TAKE ONE TABLET BY MOUTH TWICE DAILY   Study - REPRIEVE [phone] - pitavastatin 4 mg or placebo tablet ([place]) Take 1 tablet (4 mg total) by mouth daily.   No facility-administered encounter medications on file as of 04/02/2019.     Review of Systems:  Review of Systems  Constitutional: Negative for activity change and appetite change.  HENT: Negative.   Eyes: Negative.   Respiratory: Positive for shortness of breath.   Endocrine: Negative.   Genitourinary: Negative.   Musculoskeletal:       Right shoulder pain  Skin: Negative.     Health Maintenance  Topic Date Due   TETANUS/TDAP  01/07/1975   Fecal DNA (Cologuard)  09/27/2019   PAP SMEAR-Modifier  12/26/2019   MAMMOGRAM  12/21/2020   INFLUENZA VACCINE   Completed   Hepatitis C Screening  Completed   HIV Screening  Completed    Physical Exam: Vitals:   04/02/19 1458  BP: 120/86  Pulse: 90  Resp: 18  Temp: 97.7 F (36.5 C)  SpO2: 94%  Weight: 219 lb 12.8 oz (99.7 kg)  Height: _0  (1.702 m)   Body mass index is 34.43 kg/m. Physical Exam Constitutional:      Appearance: Normal appearance. She is obese.  HENT:     Head: Normocephalic.     Nose: Nose normal.     Mouth/Throat:     Mouth: Mucous membranes are moist.     Pharynx: Oropharynx is clear.  Neck:     Musculoskeletal: Normal range of motion and neck supple.  Cardiovascular:     Rate and Rhythm: Normal rate and regular rhythm.     Pulses: Normal pulses.     Heart sounds: Normal heart sounds.  Pulmonary:     Effort: Pulmonary effort is normal.     Breath sounds: Normal breath sounds.     Comments: Decreased breath sounds on right Abdominal:     General: Bowel sounds are normal.     Palpations: Abdomen is soft.  Musculoskeletal: Normal range of motion.  Skin:    General: Skin is warm and dry.  Neurological:     General: No focal deficit present.     Mental Status: She is alert and oriented to person, place, and time.     Gait: Gait normal.  Psychiatric:        Behavior: Behavior normal.        Thought Content: Thought content normal.        Judgment: Judgment normal.     Labs reviewed: Basic Metabolic Panel: Recent Labs    01/09/19 1222  02/20/19 1324  03/20/19 1235 03/25/19 1413   NA  --    < > 140   < > 140 140   K  --    < > 3.7   < > 3.6 3.8   CL  --    < > 102   < > 103 105   CO2  --    < > 26   < > 28 26   GLUCOSE  --    < > 85   < > 86 88   BUN  --    < > 18   < > 15 15   CREATININE  --    < > 1.45*   < > 1.30* 1.30*   CALCIUM  --    < > 10.6*   < > 10.3 10.3   TSH 7.373*  --  0.841  --   --   --     < > = values in this interval not displayed.   Liver Function Tests: Recent Labs    03/20/19 1235 03/25/19 1413   AST 37 33   ALT  11 9   ALKPHOS 55 57   BILITOT 0.4 0.4   PROT 7.8 7.9   ALBUMIN 3.8 3.7 .    CBC: Recent Labs    03/20/19 1235 03/25/19 1413   WBC 3.5* 5.8   NEUTROABS 1.0* 3.1   HGB 11.2* 11.2*   HCT 34.1* 34.5*   MCV 95.3 95.6   PLT 282 275    Lipid Panel: Recent Labs    09/18/18 0911  CHOL 179  HDL 54  LDLCALC 101*  TRIG 141  CHOLHDL 3.3   Lab Results  Component Value Date   HGBA1C 6.1 03/07/2019    Procedures since last visit: Nm Pet Image Restag (ps) Skull Base To Thigh  Result Date: 03/12/2019 CLINICAL DATA:  Subsequent treatment strategy for metastatic breast cancer. EXAM: NUCLEAR MEDICINE PET SKULL BASE TO THIGH TECHNIQUE: 11.7 mCi F-18 FDG was injected intravenously. Full-ring PET imaging was performed from the skull base to thigh after the radiotracer. CT data was obtained and used for attenuation correction and anatomic localization. Fasting blood glucose: 93 mg/dl COMPARISON:  Multiple exams, including 11/28/2018 FINDINGS: Mediastinal blood pool activity: SUV max 2.0 Liver activity: SUV max 2.7 NECK: No significant abnormal hypermetabolic activity in this region. Incidental CT findings: Mucous retention cyst in the left maxillary sinus. CHEST: Overall mild improvement in adenopathy and pleural tumor compared to prior. A lower paratracheal node anterior to the carina measures 1.6 cm in short axis on image 60/4 (formerly 2.6 cm) with maximum SUV 7.6 (formerly 15.6). The right middle lobe mass is primarily photopenic today, with loss of much of the peripheral activity along this mass. It is difficult to measure the signal intensity of this mass separate from the surrounding pleural metastatic rind, but posterior portions the mass maximum SUV of about 9.3, formerly 22.4. The centrally necrotic portion of the mass is hypodense and measures about 3.8 cm in diameter, formerly 3.1 cm, indicating increased central necrosis. The pleural rind tumor demonstrates reduced thickness and reduced  metabolic activity. Posterior pleural tumor just below the level of the carina has maximum SUV of 10.9, formerly 14.7. Although the trend is generally improvement, there are several lymph nodes which are small but have increased metabolic activity compared to the prior exam. For example, a pericardial lymph node measuring 0.7 cm in short axis on image 62/4 (formerly the same) has a maximum SUV of 6.0 today, previously 4.6. A prevascular node measuring 1.1 cm in short axis on image 55/4 (formerly 0.9 cm) has a maximum SUV of 6.3, formerly 3.7. Incidental CT findings: Right Port-A-Cath tip: Right atrium. Atherosclerotic aortic arch. Moderate cardiomegaly. ABDOMEN/PELVIS: Previous gastrohepatic ligament adenopathy is indistinct today but measures about 1.0 cm in short axis on image 94/4 (formerly 1.7 cm) with maximum SUV 1.8 (formerly 9.2). A previously mildly hypermetabolic lymph node near the apex of the left adrenal gland is no longer readily apparent. There continues to be some mildly hypermetabolic retroperitoneal adenopathy, with a left para-aortic lymph node measuring 0.9 cm in short axis on image 114/4 (formerly 0.9 cm) with maximum SUV 9.7 (formerly 12.8). Incidental CT findings: Small dependent gallstones in the gallbladder. Mild sigmoid colon diverticulosis. SKELETON: Progressive hypermetabolic activity and progressive lytic lesions eccentric to the right in  the T1 vertebral body and posterior elements, maximum SUV 8.9, previously 7.2. New lytic metastatic lesions including in the right skull base just lateral to the foramen magnum; at L5; in both iliac bones; and in both femoral heads. The left eccentric lesion in the L5 vertebral body has maximum standard uptake value of 8.8; the right femoral head lesion has maximum SUV of 10.9. Incidental CT findings: Grade 1 anterolisthesis at L4-5 and L5-S1. IMPRESSION: 1. Mixed appearance, with generalized mild to moderate improvement in thoracic and upper abdominal  adenopathy and soft tissue metastatic lesions including the right pleural space; but with multiple new osseous metastatic lesions identified. 2. Other imaging findings of potential clinical significance: Search after thorough moderate cardiomegaly. Chronic left maxillary sinusitis. Cholelithiasis. Sigmoid colon diverticulosis. Electronically Signed   By: Van Clines M.D.   On: 03/12/2019 07:57    Assessment/Plan  1. Malignant neoplasm of upper-outer quadrant of right breast in female, estrogen receptor negative ([place]) - currently having infusion, follows up with oncology  2. Essential hypertension - stable,, continue Benicar HCT  3. Postablative hypothyroidism - tsh 0.841 (8/20), continue Levothyroxine  4. Human immunodeficiency virus (HIV) disease ([place]) - continue Juluca, Prezcobix and Selzentry, follows up with Infectious Disease   Labs/tests ordered:  None  Next appt:  10/20/2019  Durenda Age, DNP, FNP-BC [facility] [phone] (Monday-Friday 8:00 a.m. - 5:00 p.m.) [phone] (after hours)

## 2019-04-05 NOTE — Patient Instructions (Addendum)
Hypertension, Adult Hypertension is another name for high blood pressure. High blood pressure forces your heart to work harder to pump blood. This can cause problems over time. There are two numbers in a blood pressure reading. There is a top number (systolic) over a bottom number (diastolic). It is best to have a blood pressure that is below 120/80. Healthy choices can help lower your blood pressure, or you may need medicine to help lower it. What are the causes? The cause of this condition is not known. Some conditions may be related to high blood pressure. What increases the risk?  Smoking.  Having type 2 diabetes mellitus, high cholesterol, or both.  Not getting enough exercise or physical activity.  Being overweight.  Having too much fat, sugar, calories, or salt (sodium) in your diet.  Drinking too much alcohol.  Having long-term (chronic) kidney disease.  Having a family history of high blood pressure.  Age. Risk increases with age.  Race. You may be at higher risk if you are African American.  Gender. Men are at higher risk than women before age 51. After age 79, women are at higher risk than men.  Having obstructive sleep apnea.  Stress. What are the signs or symptoms?  High blood pressure may not cause symptoms. Very high blood pressure (hypertensive crisis) may cause: ? Headache. ? Feelings of worry or nervousness (anxiety). ? Shortness of breath. ? Nosebleed. ? A feeling of being sick to your stomach (nausea). ? Throwing up (vomiting). ? Changes in how you see. ? Very bad chest pain. ? Seizures. How is this treated?  This condition is treated by making healthy lifestyle changes, such as: ? Eating healthy foods. ? Exercising more. ? Drinking less alcohol.  Your health care provider may prescribe medicine if lifestyle changes are not enough to get your blood pressure under control, and if: ? Your top number is above 130. ? Your bottom number is above 80.   Your personal target blood pressure may vary. Follow these instructions at home: Eating and drinking   If told, follow the DASH eating plan. To follow this plan: ? Fill one half of your plate at each meal with fruits and vegetables. ? Fill one fourth of your plate at each meal with whole grains. Whole grains include whole-wheat pasta, brown rice, and whole-grain bread. ? Eat or drink low-fat dairy products, such as skim milk or low-fat yogurt. ? Fill one fourth of your plate at each meal with low-fat (lean) proteins. Low-fat proteins include fish, chicken without skin, eggs, beans, and tofu. ? Avoid fatty meat, cured and processed meat, or chicken with skin. ? Avoid pre-made or processed food.  Eat less than 1,500 mg of salt each day.  Do not drink alcohol if: ? Your doctor tells you not to drink. ? You are pregnant, may be pregnant, or are planning to become pregnant.  If you drink alcohol: ? Limit how much you use to:  0-1 drink a day for women.  0-2 drinks a day for men. ? Be aware of how much alcohol is in your drink. In the [place] one drink equals one 12 oz bottle of beer (355 mL), one 5 oz glass of wine (148 mL), or one 1 oz glass of hard liquor (44 mL). Lifestyle   Work with your doctor to stay at a healthy weight or to lose weight. Ask your doctor what the best weight is for you.  Get at least 30 minutes of exercise most  days of the week. This may include walking, swimming, or biking.  Get at least 30 minutes of exercise that strengthens your muscles (resistance exercise) at least 3 days a week. This may include lifting weights or doing Pilates.  Do not use any products that contain nicotine or tobacco, such as cigarettes, e-cigarettes, and chewing tobacco. If you need help quitting, ask your doctor.  Check your blood pressure at home as told by your doctor.  Keep all follow-up visits as told by your doctor. This is important. Medicines  Take over-the-counter and  prescription medicines only as told by your doctor. Follow directions carefully.  Do not skip doses of blood pressure medicine. The medicine does not work as well if you skip doses. Skipping doses also puts you at risk for problems.  Ask your doctor about side effects or reactions to medicines that you should watch for. Contact a doctor if you:  Think you are having a reaction to the medicine you are taking.  Have headaches that keep coming back (recurring).  Feel dizzy.  Have swelling in your ankles.  Have trouble with your vision. Get help right away if you:  Get a very bad headache.  Start to feel mixed up (confused).  Feel weak or numb.  Feel faint.  Have very bad pain in your: ? Chest. ? Belly (abdomen).  Throw up more than once.  Have trouble breathing. Summary  Hypertension is another name for high blood pressure.  High blood pressure forces your heart to work harder to pump blood.  For most people, a normal blood pressure is less than 120/80.  Making healthy choices can help lower blood pressure. If your blood pressure does not get lower with healthy choices, you may need to take medicine. This information is not intended to replace advice given to you by your health care provider. Make sure you discuss any questions you have with your health care provider. Document Released: 11/28/2007 Document Revised: 02/19/2018 Document Reviewed: 02/19/2018 Elsevier Patient Education  2020 [place] with AIDS AIDS (acquired immunodeficiency syndrome) occurs when your body's disease-fighting system (immune system) is badly damaged and no longer protects you from infections and other health problems. This condition is caused by HIV (human immunodeficiency virus) infection. Although your immune system is weakened when you are living with AIDS, your health care provider will give you instructions about how to stay as healthy as possible. How to manage  lifestyle changes Managing stress  Techniques to manage stress include: ? Meditation, muscle relaxation, and breathing exercises. ? Talk therapy. ? Joining a support group of other people who have AIDS. ? Getting 7 to 8 hours of sleep each night. ? Spending time on hobbies and activities that you enjoy. Relationships  Talk to your state's health department about partner notification services. This free service can help you find your sex partners who may have been exposed to AIDS and provide screening.  Talk to your sex partners about taking medicine to prevent AIDS (pre-exposure prophylaxis).  Protect your sex partners by telling them you have AIDS. Always use a condom during sex. This includes vaginal, oral, and anal sex.  Do not share needles or other equipment that is used for injecting, smoking, or snorting drugs. Use a clean, unused needle and syringe every time. Dispose of all needles and syringes after use. Safety and prevention   Wash your hands often with soap and warm water. If soap and water are not available, use hand  sanitizer. Wash your hands: ? After coming into contact with human stool (feces). ? After working in the garden or yard. ? After handling a pet. ? Before preparing or eating food. ? After using the bathroom.  Wash all fruits and vegetables before eating.  Always wash your utensils and cutting boards with soap and water after each use.  Always keep raw foods separate from other food. Use a separate cutting board for raw meat.  If you are around pets and other animals: ? Do not touch their feces. If you have a cat, ask someone else to clean the litter box. ? If your pet has diarrhea, ask someone else to take your pet to the vet for you. Ask the vet whether the diarrhea is caused by bacteria that is harmful to you. ? Make sure all your pets are up to date on their vaccines. ? Avoid touching reptiles, such as snakes, lizards, or turtles.  Do not drink  water from rivers, lakes, or ponds.  Drink bottled water or filtered tap water.  If you are planning a trip to another country, talk to your health care provider. While traveling, always drink bottled water and be careful about what foods you eat. How to recognize changes in your condition AIDS is a lifelong (chronic) illness. You may show no signs or symptoms of illness when AIDS is well managed. Following appropriate medical treatment will lessen the risk of infection and improve the quality and length of your life. Signs that your condition is getting worse may include:  Fever.  Persistent diarrhea.  Skin problems.  Weight loss.  Shortness of breath or difficulty breathing.  Changes in vision or memory. Follow these instructions at home: Medicines   Take over-the-counter and prescription medicines only as told by your health care provider. Do not change the dose or the time of day that you take the medicine without first getting approval from your health care provider. Taking medicines may help to prevent spreading HIV/AIDS to others.  Tell your health care provider about all over-the-counter or prescription medicines you are taking, including vitamins, dietary supplements, herbs, eye drops, creams, and ointments.  If you were prescribed an antibiotic medicine, take it as told by your health care provider. Do not stop taking the antibiotic even if you start to feel better. Eating and drinking   Talk with your health care provider or nutritionist about the best diet for you. Based on your condition, you may need to eat more protein or carbohydrates.  Drink enough fluid to keep your urine pale yellow.  Follow instructions from your health care provider about avoiding certain foods while taking certain medicines.  Do not eat: ? Raw or undercooked fish, meat, or eggs. ? Raw (unpasteurized) milk, juices, honey, or cheese. ? Raw sprouts, such as alfalfa sprouts. Activity   Exercise regularly. Try doing 30 minutes of moderate exercise 5 days a week.  Talk with your health care provider before starting a new exercise routine. Lifestyle  If you drink alcohol, limit how much you have: ? 0-1 drink a day for women. ? 0-2 drinks a day for men.  Be aware of how much alcohol is in your drink. In the [place] one drink equals one 12 oz bottle of beer (355 mL), one 5 oz glass of wine (148 mL), or one 1 oz glass of hard liquor (44 mL).  Do not use any products that contain nicotine or tobacco, such as cigarettes and e-cigarettes. If you need  help quitting, ask your health care provider. General instructions  Ask your health care provider which vaccines you should get.  Keep all follow-up visits as told by your health care provider. This is important. Where to find more information  Centers for Disease Control and Prevention: FindingEternity.ca  [place] Department of Health and Human Services: [employer] Contact a health care provider if:  You miss a dose of medicine.  You have new side effects from your medicine. Get help right away if:  You have signs of an infection, such as a fever, chills, or sore throat.  You have shortness of breath.  You have chest pain.  You vomit multiple times.  You have diarrhea.  You have vision problems.  You have confusion.  You have problems controlling your muscles.  You have severe pain that does not get better. Summary  AIDS occurs when your body's immune system is badly damaged and no longer protects you from infections and other health problems.  Although your immune system is weakened when you are living with AIDS, your health care provider will give you instructions about how to stay as healthy as possible.  Contact a health care provider right away if you have any signs of an infection, such as a fever, chills, or sore throat. This information is not intended to replace advice given to you by your health care  provider. Make sure you discuss any questions you have with your health care provider. Document Released: 10/17/2017 Document Revised: 10/03/2018 Document Reviewed: 10/17/2017 Elsevier Patient Education  [place].  Hypothyroidism  Hypothyroidism is when the thyroid gland does not make enough of certain hormones (it is underactive). The thyroid gland is a small gland located in the lower front part of the neck, just in front of the windpipe (trachea). This gland makes hormones that help control how the body uses food for energy (metabolism) as well as how the heart and brain function. These hormones also play a role in keeping your bones strong. When the thyroid is underactive, it produces too little of the hormones thyroxine (T4) and triiodothyronine (T3). What are the causes? This condition may be caused by:  Hashimoto's disease. This is a disease in which the body's disease-fighting system (immune system) attacks the thyroid gland. This is the most common cause.  Viral infections.  Pregnancy.  Certain medicines.  Birth defects.  Past radiation treatments to the head or neck for cancer.  Past treatment with radioactive iodine.  Past exposure to radiation in the environment.  Past surgical removal of part or all of the thyroid.  Problems with a gland in the center of the brain (pituitary gland).  Lack of enough iodine in the diet. What increases the risk? You are more likely to develop this condition if:  You are female.  You have a family history of thyroid conditions.  You use a medicine called lithium.  You take medicines that affect the immune system (immunosuppressants). What are the signs or symptoms? Symptoms of this condition include:  Feeling as though you have no energy (lethargy).  Not being able to tolerate cold.  Weight gain that is not explained by a change in diet or exercise habits.  Lack of appetite.  Dry skin.  Coarse hair.   Menstrual irregularity.  Slowing of thought processes.  Constipation.  Sadness or depression. How is this diagnosed? This condition may be diagnosed based on:  Your symptoms, your medical history, and a physical exam.  Blood tests. You may  also have imaging tests, such as an ultrasound or MRI. How is this treated? This condition is treated with medicine that replaces the thyroid hormones that your body does not make. After you begin treatment, it may take several weeks for symptoms to go away. Follow these instructions at home:  Take over-the-counter and prescription medicines only as told by your health care provider.  If you start taking any new medicines, tell your health care provider.  Keep all follow-up visits as told by your health care provider. This is important. ? As your condition improves, your dosage of thyroid hormone medicine may change. ? You will need to have blood tests regularly so that your health care provider can monitor your condition. Contact a health care provider if:  Your symptoms do not get better with treatment.  You are taking thyroid replacement medicine and you: ? Sweat a lot. ? Have tremors. ? Feel anxious. ? Lose weight rapidly. ? Cannot tolerate heat. ? Have emotional swings. ? Have diarrhea. ? Feel weak. Get help right away if you have:  Chest pain.  An irregular heartbeat.  A rapid heartbeat.  Difficulty breathing. Summary  Hypothyroidism is when the thyroid gland does not make enough of certain hormones (it is underactive).  When the thyroid is underactive, it produces too little of the hormones thyroxine (T4) and triiodothyronine (T3).  The most common cause is Hashimoto's disease, a disease in which the body's disease-fighting system (immune system) attacks the thyroid gland. The condition can also be caused by viral infections, medicine, pregnancy, or past radiation treatment to the head or neck.  Symptoms may include  weight gain, dry skin, constipation, feeling as though you do not have energy, and not being able to tolerate cold.  This condition is treated with medicine to replace the thyroid hormones that your body does not make. This information is not intended to replace advice given to you by your health care provider. Make sure you discuss any questions you have with your health care provider. Document Released: 06/11/2005 Document Revised: 05/24/2017 Document Reviewed: 05/22/2017 Elsevier Patient Education  2020 [employer].

## 2019-04-07 MED FILL — SELZENTRY 150 MG TABS: 150 | 30 days supply | Qty: 60 | Fill #3

## 2019-04-07 MED FILL — OLMESARTAN-HCTZ 40-25 MG TA: 40-25 | 30 days supply | Qty: 30 | Fill #1

## 2019-04-07 MED FILL — LEVOTHYROXINE 200 MCG TAB: 200 | 30 days supply | Qty: 30 | Fill #1

## 2019-04-10 ENCOUNTER — Other Ambulatory Visit: Payer: Self-pay

## 2019-04-13 ENCOUNTER — Ambulatory Visit: Payer: Medicare HMO | Admitting: Endocrinology

## 2019-04-13 ENCOUNTER — Telehealth: Payer: Self-pay | Admitting: Physician Assistant

## 2019-04-13 NOTE — Telephone Encounter (Signed)
Returned patient's phone call regarding rescheduling November appointment time, per patient's request appointment time has changed.

## 2019-04-14 ENCOUNTER — Ambulatory Visit: Payer: Medicare HMO | Admitting: Infectious Disease

## 2019-04-14 ENCOUNTER — Ambulatory Visit: Payer: Medicare HMO | Admitting: Endocrinology

## 2019-04-15 ENCOUNTER — Ambulatory Visit: Payer: Medicare HMO | Admitting: Infectious Disease

## 2019-04-16 ENCOUNTER — Other Ambulatory Visit: Payer: Self-pay | Admitting: Infectious Disease

## 2019-04-16 DIAGNOSIS — B20 Human immunodeficiency virus [HIV] disease: Secondary | ICD-10-CM

## 2019-04-17 ENCOUNTER — Inpatient Hospital Stay: Payer: Medicare HMO

## 2019-04-17 ENCOUNTER — Other Ambulatory Visit: Payer: Self-pay

## 2019-04-17 VITALS — BP 117/87 | HR 85 | Temp 98.9°F | Resp 18 | Ht 67.0 in | Wt 216.0 lb

## 2019-04-17 DIAGNOSIS — Z171 Estrogen receptor negative status [ER-]: Secondary | ICD-10-CM

## 2019-04-17 DIAGNOSIS — C50411 Malignant neoplasm of upper-outer quadrant of right female breast: Secondary | ICD-10-CM

## 2019-04-17 DIAGNOSIS — C771 Secondary and unspecified malignant neoplasm of intrathoracic lymph nodes: Secondary | ICD-10-CM | POA: Diagnosis not present

## 2019-04-17 DIAGNOSIS — Z23 Encounter for immunization: Secondary | ICD-10-CM | POA: Diagnosis not present

## 2019-04-17 DIAGNOSIS — C50911 Malignant neoplasm of unspecified site of right female breast: Secondary | ICD-10-CM

## 2019-04-17 DIAGNOSIS — Z79899 Other long term (current) drug therapy: Secondary | ICD-10-CM | POA: Diagnosis not present

## 2019-04-17 DIAGNOSIS — C7951 Secondary malignant neoplasm of bone: Secondary | ICD-10-CM | POA: Diagnosis not present

## 2019-04-17 DIAGNOSIS — Z5189 Encounter for other specified aftercare: Secondary | ICD-10-CM | POA: Diagnosis not present

## 2019-04-17 DIAGNOSIS — Z5111 Encounter for antineoplastic chemotherapy: Secondary | ICD-10-CM | POA: Diagnosis not present

## 2019-04-17 DIAGNOSIS — C773 Secondary and unspecified malignant neoplasm of axilla and upper limb lymph nodes: Secondary | ICD-10-CM | POA: Diagnosis not present

## 2019-04-17 DIAGNOSIS — Z95828 Presence of other vascular implants and grafts: Secondary | ICD-10-CM

## 2019-04-17 DIAGNOSIS — C782 Secondary malignant neoplasm of pleura: Secondary | ICD-10-CM | POA: Diagnosis not present

## 2019-04-17 LAB — CMP (CANCER CENTER ONLY)
ALT: 12 U/L (ref 0–44)
AST: 39 U/L (ref 15–41)
Albumin: 3.9 g/dL (ref 3.5–5.0)
Alkaline Phosphatase: 84 U/L (ref 38–126)
Anion gap: 11 (ref 5–15)
BUN: 17 mg/dL (ref 8–23)
CO2: 25 mmol/L (ref 22–32)
Calcium: 9.8 mg/dL (ref 8.9–10.3)
Chloride: 103 mmol/L (ref 98–111)
Creatinine: 1.35 mg/dL — ABNORMAL HIGH (ref 0.44–1.00)
GFR, Est AFR Am: 48 mL/min — ABNORMAL LOW (ref >60)
GFR, Estimated: 42 mL/min — ABNORMAL LOW (ref >60)
Glucose, Bld: 100 mg/dL — ABNORMAL HIGH (ref 70–99)
Potassium: 3.9 mmol/L (ref 3.5–5.1)
Sodium: 139 mmol/L (ref 135–145)
Total Bilirubin: 0.3 mg/dL (ref 0.3–1.2)
Total Protein: 8.3 g/dL — ABNORMAL HIGH (ref 6.5–8.1)

## 2019-04-17 LAB — CBC WITH DIFFERENTIAL (CANCER CENTER ONLY)
Abs Immature Granulocytes: 0.11 10*3/uL — ABNORMAL HIGH (ref 0.00–0.07)
Basophils Absolute: 0.1 10*3/uL (ref 0.0–0.1)
Basophils Relative: 1 %
Eosinophils Absolute: 0 10*3/uL (ref 0.0–0.5)
Eosinophils Relative: 0 %
HCT: 37.2 % (ref 36.0–46.0)
Hemoglobin: 11.9 g/dL — ABNORMAL LOW (ref 12.0–15.0)
Immature Granulocytes: 1 %
Lymphocytes Relative: 19 %
Lymphs Abs: 2.4 10*3/uL (ref 0.7–4.0)
MCH: 30.5 pg (ref 26.0–34.0)
MCHC: 32 g/dL (ref 30.0–36.0)
MCV: 95.4 fL (ref 80.0–100.0)
Monocytes Absolute: 1.4 10*3/uL — ABNORMAL HIGH (ref 0.1–1.0)
Monocytes Relative: 11 %
Neutro Abs: 8.7 10*3/uL — ABNORMAL HIGH (ref 1.7–7.7)
Neutrophils Relative %: 68 %
Platelet Count: 340 10*3/uL (ref 150–400)
RBC: 3.9 MIL/uL (ref 3.87–5.11)
RDW: 15.9 % — ABNORMAL HIGH (ref 11.5–15.5)
WBC Count: 12.7 10*3/uL — ABNORMAL HIGH (ref 4.0–10.5)
nRBC: 0 % (ref 0.0–0.2)

## 2019-04-17 MED ORDER — PROCHLORPERAZINE MALEATE 10 MG PO TABS
10.0000 mg | ORAL_TABLET | Freq: Once | ORAL | Status: AC
  Administered 2019-04-17: 14:00:00 10 mg via ORAL

## 2019-04-17 MED ORDER — PEGFILGRASTIM 6 MG/0.6ML ~~LOC~~ PSKT
6.0000 mg | PREFILLED_SYRINGE | Freq: Once | SUBCUTANEOUS | Status: AC
  Administered 2019-04-17: 15:00:00 6 mg via SUBCUTANEOUS

## 2019-04-17 MED ORDER — SODIUM CHLORIDE 0.9 % IV SOLN
Freq: Once | INTRAVENOUS | Status: AC
  Administered 2019-04-17: 14:00:00 via INTRAVENOUS
  Filled 2019-04-17: qty 250

## 2019-04-17 MED ORDER — SODIUM CHLORIDE 0.9% FLUSH
10.0000 mL | Freq: Once | INTRAVENOUS | Status: AC
  Administered 2019-04-17: 10 mL
  Filled 2019-04-17: qty 10

## 2019-04-17 MED ORDER — PROCHLORPERAZINE MALEATE 10 MG PO TABS
ORAL_TABLET | ORAL | Status: AC
  Filled 2019-04-17: qty 1

## 2019-04-17 MED ORDER — HEPARIN SOD (PORK) LOCK FLUSH 100 UNIT/ML IV SOLN
500.0000 [IU] | Freq: Once | INTRAVENOUS | Status: AC | PRN
  Administered 2019-04-17: 500 [IU]
  Filled 2019-04-17: qty 5

## 2019-04-17 MED ORDER — PEGFILGRASTIM 6 MG/0.6ML ~~LOC~~ PSKT
PREFILLED_SYRINGE | SUBCUTANEOUS | Status: AC
  Filled 2019-04-17: qty 0.6

## 2019-04-17 MED ORDER — SODIUM CHLORIDE 0.9 % IV SOLN
1.2000 mg/m2 | Freq: Once | INTRAVENOUS | Status: AC
  Administered 2019-04-17: 15:00:00 2.75 mg via INTRAVENOUS
  Filled 2019-04-17: qty 5.5

## 2019-04-17 MED ORDER — SODIUM CHLORIDE 0.9% FLUSH
10.0000 mL | INTRAVENOUS | Status: DC | PRN
  Administered 2019-04-17: 10 mL
  Filled 2019-04-17: qty 10

## 2019-04-17 NOTE — Patient Instructions (Signed)
[facility] Discharge Instructions for Patients Receiving Chemotherapy  Today you received the following chemotherapy agents: [place]   To help prevent nausea and vomiting after your treatment, we encourage you to take your nausea medication as directed.    If you develop nausea and vomiting that is not controlled by your nausea medication, call the clinic.   BELOW ARE SYMPTOMS THAT SHOULD BE REPORTED IMMEDIATELY:  *FEVER GREATER THAN 100.5 F  *CHILLS WITH OR WITHOUT FEVER  NAUSEA AND VOMITING THAT IS NOT CONTROLLED WITH YOUR NAUSEA MEDICATION  *UNUSUAL SHORTNESS OF BREATH  *UNUSUAL BRUISING OR BLEEDING  TENDERNESS IN MOUTH AND THROAT WITH OR WITHOUT PRESENCE OF ULCERS  *URINARY PROBLEMS  *BOWEL PROBLEMS  UNUSUAL RASH Items with * indicate a potential emergency and should be followed up as soon as possible.  Feel free to call the clinic should you have any questions or concerns. The clinic phone number is [phone].  Please show the [facility] at check-in to the Emergency Department and triage nurse.

## 2019-04-18 LAB — CANCER ANTIGEN 27.29: CA 27.29: 63.8 U/mL — ABNORMAL HIGH (ref 0.0–38.6)

## 2019-04-20 ENCOUNTER — Encounter: Payer: Self-pay | Admitting: Infectious Disease

## 2019-04-20 ENCOUNTER — Other Ambulatory Visit: Payer: Self-pay

## 2019-04-20 ENCOUNTER — Ambulatory Visit (INDEPENDENT_AMBULATORY_CARE_PROVIDER_SITE_OTHER): Payer: Medicare HMO | Admitting: Infectious Disease

## 2019-04-20 VITALS — BP 116/76 | HR 105 | Temp 97.9°F | Wt 215.0 lb

## 2019-04-20 DIAGNOSIS — C50411 Malignant neoplasm of upper-outer quadrant of right female breast: Secondary | ICD-10-CM | POA: Diagnosis not present

## 2019-04-20 DIAGNOSIS — C50911 Malignant neoplasm of unspecified site of right female breast: Secondary | ICD-10-CM

## 2019-04-20 DIAGNOSIS — Z95828 Presence of other vascular implants and grafts: Secondary | ICD-10-CM | POA: Diagnosis not present

## 2019-04-20 DIAGNOSIS — I10 Essential (primary) hypertension: Secondary | ICD-10-CM

## 2019-04-20 DIAGNOSIS — J910 Malignant pleural effusion: Secondary | ICD-10-CM

## 2019-04-20 DIAGNOSIS — Z171 Estrogen receptor negative status [ER-]: Secondary | ICD-10-CM

## 2019-04-20 DIAGNOSIS — B20 Human immunodeficiency virus [HIV] disease: Secondary | ICD-10-CM | POA: Diagnosis not present

## 2019-04-20 MED FILL — JULUCA 50-25 MG TAB: 50-25 | 30 days supply | Qty: 30 | Fill #0

## 2019-04-20 MED FILL — PREZCOBIX 800 MG-150 MG TAB: 800-150 | 30 days supply | Qty: 30 | Fill #0

## 2019-04-20 NOTE — Progress Notes (Signed)
Subjective:    Patient ID: Kayla Price, female    DOB: May 30, 1956, 63 y.o.   MRN: [id number]  HPI Kayla Price is doing quite well.  She is highly adherent to her antiretroviral regimen JULUCA and Prezcobix and maintaining a viral load that is less than 20 and a healthy CD4 count. She is also on BID maraviroc with idea that it might help w metastatic disease prevention which in her case it did not but I have nto removed it.  Breast cancer responded to chemotherapy and her thorax but she has developed disease in her spine now.  She is being followed very closely by oncology and received flu shot from them recently.  She is highly adherent to his aunt her antiretroviral medications but is not had a recent viral load or CD4 count checked.    Past Medical History:  Diagnosis Date  . Alopecia areata 11/28/2009  . Bell's palsy   . Cancer [facility]) 2005   Breast cancer   chemotherapy and radiation  . CKD (chronic kidney disease) stage 3, GFR 30-59 ml/min 06/08/2015  . Dry eye syndrome   . Family history of lung cancer   . Family history of non-Hodgkin's lymphoma   . Fasting hyperglycemia   . Gestational diabetes    2001  . HIP FRACTURE, RIGHT 05/06/2008  . History of kidney stones   . HIV DISEASE 03/27/2006  . HYPERLIPIDEMIA, MIXED 12/15/2007  . HYPERTENSION 03/27/2006  . 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 MENORRHAGIA, POSTMENOPAUSAL 02/03/2009  . Osteoarthritis of left knee 06/08/2015  . OSTEOARTHROSIS, LOCAL, SCND, UNSPC SITE 04/07/2007  . Personal history of chemotherapy 11/2018  . PVD 04/07/2007  . Tinea capitis   . TRIGGER FINGER 05/06/2008  . Unspecified vitamin D deficiency 08/06/2007    Past Surgical History:  Procedure Laterality Date  . BREAST LUMPECTOMY Right    2005  . CHEST TUBE INSERTION Right 05/30/2018   Procedure: INSERTION PLEURAL DRAINAGE CATHETER;  Surgeon: Grace Isaac, MD;  Location: [place];  Service: Thoracic;  Laterality: Right;  . COLONOSCOPY     . COLONOSCOPY WITH ESOPHAGOGASTRODUODENOSCOPY (EGD)    . ENDOBRONCHIAL ULTRASOUND Bilateral 05/15/2018   Procedure: ENDOBRONCHIAL ULTRASOUND;  Surgeon: Collene Gobble, MD;  Location: [facility];  Service: Cardiopulmonary;  Laterality: Bilateral;  . FINE NEEDLE ASPIRATION BIOPSY  05/15/2018   Procedure: FINE NEEDLE ASPIRATION BIOPSY;  Surgeon: Collene Gobble, MD;  Location: [facility];  Service: Cardiopulmonary;;  . FLEXIBLE BRONCHOSCOPY  05/15/2018   Procedure: FLEXIBLE BRONCHOSCOPY;  Surgeon: Collene Gobble, MD;  Location: [facility];  Service: Cardiopulmonary;;  . HYSTEROSCOPY  2006  . IR IMAGING GUIDED PORT INSERTION  07/14/2018  . IR THORACENTESIS ASP PLEURAL SPACE W/IMG GUIDE  04/14/2018  . IR THORACENTESIS ASP PLEURAL SPACE W/IMG GUIDE  04/28/2018  . KNEE ARTHROSCOPY Left 2002  . LYMPH NODE DISSECTION  2005  . MASTECTOMY Right   . MASTECTOMY MODIFIED RADICAL Right 11/05/2016   Procedure: RIGHT MASTECTOMY MODIFIED RADICAL;  Surgeon: Fanny Skates, MD;  Location: [place];  Service: General;  Laterality: Right;  . MODIFIED RADICAL MASTECTOMY Right 11/05/2016  . placement   of port-a-cath    . PLEURAL BIOPSY Right 05/30/2018   Procedure: PLEURAL BIOPSY;  Surgeon: Grace Isaac, MD;  Location: [place];  Service: Thoracic;  Laterality: Right;  . PLEURAL EFFUSION DRAINAGE Right 05/30/2018   Procedure: DRAINAGE OF PLEURAL EFFUSION;  Surgeon: Grace Isaac, MD;  Location: [place];  Service: Thoracic;  Laterality: Right;  .  PORT-A-CATH REMOVAL  2006   insertion 2005  . PORT-A-CATH REMOVAL N/A 03/29/2017   Procedure: REMOVAL PORT-A-CATH;  Surgeon: Fanny Skates, MD;  Location: [place];  Service: General;  Laterality: N/A;  . PORTACATH PLACEMENT Right 11/05/2016   Procedure: INSERTION PORT-A-CATH WITH ULTRA SOUND;  Surgeon: Fanny Skates, MD;  Location: [place];  Service: General;  Laterality: Right;  . removal of port a cath  12/2014  . TALC PLEURODESIS Right 05/30/2018   Procedure:  Pietro Cassis;  Surgeon: Grace Isaac, MD;  Location: [place];  Service: Thoracic;  Laterality: Right;  . THYROID SURGERY  2009   Ablation   . TOTAL KNEE ARTHROPLASTY Left 02/06/2016   Procedure: TOTAL KNEE ARTHROPLASTY;  Surgeon: Frederik Pear, MD;  Location: [place];  Service: Orthopedics;  Laterality: Left;  . TUBAL LIGATION    . VIDEO ASSISTED THORACOSCOPY Right 05/30/2018   Procedure: VIDEO ASSISTED THORACOSCOPY;  Surgeon: Grace Isaac, MD;  [facility];  Service: Thoracic;  Laterality: Right;    Family History  Problem Relation Age of Onset  . Heart attack Brother        Massive MI in 70s  . Stroke Brother        CAD  . Kidney disease Mother   . Stroke Mother   . Diabetes Mother   . Liver disease Sister   . COPD Sister        had I-131 rx of hyperthyroidism  . Diabetes Sister   . Stroke Sister   . Lung cancer Paternal Uncle        hx smoking  . Cancer Cousin   . Non-Hodgkin's lymphoma Cousin 25       cancer x3, in prostate and lung- unsure if met/spread or if primaries  . Heart failure Father   . Heart disease Father   . Arthritis Father   . Sarcoidosis Sister       Social History   Socioeconomic History  . Marital status: Widowed    Spouse name: Not on file  . Number of children: Not on file  . Years of education: Not on file  . Highest education level: Not on file  Occupational History  . Occupation: Paediatric nurse: UNEMPLOYED  Social Needs  . Financial resource strain: Not hard at all  . Food insecurity    Worry: Never true    Inability: Never true  . Transportation needs    Medical: No    Non-medical: No  Tobacco Use  . Smoking status: Never Smoker  . Smokeless tobacco: Never Used  Substance and Sexual Activity  . Alcohol use: No  . Drug use: No  . Sexual activity: Not Currently    Birth control/protection: Post-menopausal    Comment: declined condoms  Lifestyle  . Physical activity    Days per week: 3 days    Minutes  per session: 120 min  . Stress: Not at all  Relationships  . Social connections    Talks on phone: More than three times a week    Gets together: More than three times a week    Attends religious service: More than 4 times per year    Active member of club or organization: No    Attends meetings of clubs or organizations: Never    Relationship status: Widowed  Other Topics Concern  . Not on file  Social History Narrative   Single/widow   Diet: good   Do you drink/eat things with  caffeine? Tea occasionally   Marital status: Widowed  What year were you married? 1993   Do you live in a house, apartment,assisted living, condo,trailer,ect.)? House   Is it one or more stories? Two   How many persons live in your home? 4   Do you have any pets in you home? No   Current or past profession: Tour manager   Do you exercise? Yes  Type&how often: Stationary Bike, Water Exercise 3-4 x week   Do you have a living will? Yes   Do you have a DNR form? No  If not do you what one?   Do you have signed POA/HPOA forms? No   If so, please bring to your appointment.                Allergies  Allergen Reactions  . Lisinopril Anaphylaxis and Swelling    Swelling of tongue and mouth 11/05/16- tolerates Olmesartan  . Pepcid [Famotidine] Other (See Comments)    PPI H2, BLOCKERS LOWER GASTRIC PH WHICH WOULD LEAD TO SUBTHERAPEUTIC RILPIVIRINE LEVELS AND POTENTIAL VIROLOGICAL FAILURE WITH RESISTANCE  . Prilosec [Omeprazole] Other (See Comments)    PPI H2, BLOCKERS LOWER GASTRIC PH WHICH WOULD LEAD TO SUBTHERAPEUTIC RILPIVIRINE LEVELS AND POTENTIAL VIROLOGICAL FAILURE WITH RESISTANCE  . Tums [Calcium Carbonate Antacid] Other (See Comments)    TUMS ANTACIDS CAN LOWER GASTRIC PH WHICH COULD  LEAD TO SUBTHERAPEUTIC RILPIVIRINE LEVELS AND POTENTIAL VIROLOGICAL FAILURE WITH RESISTANCE TUMS CAN BE GIVEN BUT NEED CONSULT WITH [facility] PHARMACY RE TIMING. I PREFER HER TO AVOID ALL TOGETHER     Current Outpatient  Medications:  .  ELDERBERRY PO, Take 50 mg by mouth daily., Disp: , Rfl:  .  HYDROcodone-acetaminophen (NORCO/VICODIN) 5-325 MG tablet, TAKE ONE TABLET 4 TIMES A DAY AS INSTRUCTED, Disp: 120 tablet, Rfl: 0 .  hydrocortisone cream 1 %, Apply 1 application topically daily as needed for itching., Disp: , Rfl:  .  JULUCA 50-25 MG TABS, TAKE 1 TABLET BY MOUTH DAILY WITH BREAKFAST. TAKE WITH THE PREZCOBIX., Disp: 30 tablet, Rfl: 3 .  levothyroxine (SYNTHROID) 200 MCG tablet, Take 1 tablet (200 mcg total) by mouth daily before breakfast., Disp: 90 tablet, Rfl: 1 .  lidocaine-prilocaine (EMLA) cream, APPLY 1 APPLICATION TOPICALLY AS NEEDED. APPLY TO PORT SITE 1 HOUR PRIOR TO ACCESS, Disp: 30 g, Rfl: 0 .  Loratadine 10 MG CAPS, Take by mouth as needed., Disp: , Rfl:  .  Menthol, Topical Analgesic, (BENGAY EX), Apply 1 application topically daily as needed (muscle pain)., Disp: , Rfl:  .  methocarbamol (ROBAXIN) 500 MG tablet, Take 1 tablet (500 mg total) by mouth every 8 (eight) hours as needed for muscle spasms., Disp: 90 tablet, Rfl: 1 .  olmesartan-hydrochlorothiazide (BENICAR HCT) 40-25 MG tablet, TAKE 1 TABLET BY MOUTH DAILY., Disp: 30 tablet, Rfl: 1 .  PREZCOBIX 800-150 MG tablet, TAKE 1 TABLET BY MOUTH DAILY. SWALLOW WHOLE. DO NOT CRUSH, BREAK OR CHEW TABLETS. TAKE WITH FOOD., Disp: 30 tablet, Rfl: 3 .  Propylene Glycol (SYSTANE BALANCE OP), Place 1 drop into both eyes 4 (four) times daily as needed., Disp: , Rfl:  .  SELZENTRY 150 MG tablet, TAKE ONE TABLET BY MOUTH TWICE DAILY, Disp: 60 tablet, Rfl: 5 .  Study - REPRIEVE [phone] - pitavastatin 4 mg or placebo tablet ([place]), Take 1 tablet (4 mg total) by mouth daily., Disp: 30 tablet, Rfl:     Review of Systems  Constitutional: Positive for fatigue. Negative for chills and  fever.  HENT: Negative for congestion and sore throat.   Eyes: Negative for photophobia.  Respiratory: Negative for cough, shortness of breath and wheezing.    Cardiovascular: Negative for chest pain, palpitations and leg swelling.  Gastrointestinal: Negative for abdominal pain, blood in stool, constipation, diarrhea, nausea and vomiting.  Genitourinary: Negative for dysuria, flank pain and hematuria.  Musculoskeletal: Positive for arthralgias and back pain. Negative for myalgias.  Skin: Negative for rash.  Neurological: Negative for dizziness, weakness and headaches.  Hematological: Does not bruise/bleed easily.  Psychiatric/Behavioral: Negative for agitation, confusion, decreased concentration, dysphoric mood, hallucinations and suicidal ideas.       Objective:   Physical Exam Constitutional:      General: She is not in acute distress.    Appearance: She is well-developed. She is not diaphoretic.  HENT:     Head: Normocephalic and atraumatic.     Mouth/Throat:     Pharynx: No oropharyngeal exudate.  Eyes:     General: No scleral icterus.    Conjunctiva/sclera: Conjunctivae normal.  Neck:     Musculoskeletal: Normal range of motion and neck supple.  Cardiovascular:     Rate and Rhythm: Normal rate and regular rhythm.  Pulmonary:     Effort: Pulmonary effort is normal. No respiratory distress.     Breath sounds: No wheezing.  Abdominal:     General: There is no distension.  Musculoskeletal:        General: No tenderness.  Skin:    General: Skin is warm and dry.     Coloration: Skin is not pale.     Findings: No erythema or rash.  Neurological:     General: No focal deficit present.     Mental Status: She is alert and oriented to person, place, and time.     Motor: No abnormal muscle tone.     Coordination: Coordination normal.  Psychiatric:        Mood and Affect: Mood normal.        Behavior: Behavior normal.        Thought Content: Thought content normal.        Judgment: Judgment normal.           Assessment & Plan:  HIV disease: Continue her Prezcobix and JULUCA I have continue maraviroc for now though do not  know that we really need this.  Metastatic breast cancer: Has responded within her hemothorax with improved quality of life improved breathing but does involve spine now as well based on PET scan done in September 2020  Hypertension was elevated today after having a protracted bout with traffic stress and having only taken her antihypertensives recently.  Hypothyroidism she is to follow-up with Dr. Loanne Drilling.

## 2019-04-21 LAB — T-HELPER CELL (CD4) - (RCID CLINIC ONLY)
CD4 % Helper T Cell: 36 % (ref 33–65)
CD4 T Cell Abs: 475 /uL (ref 400–1790)

## 2019-04-22 ENCOUNTER — Other Ambulatory Visit: Payer: Self-pay

## 2019-04-23 LAB — HIV-1 RNA QUANT-NO REFLEX-BLD
HIV 1 RNA Quant: <20 copies/mL
HIV-1 RNA Quant, Log: <1.3 Log copies/mL

## 2019-04-24 ENCOUNTER — Ambulatory Visit: Payer: Medicare HMO | Admitting: Endocrinology

## 2019-04-24 ENCOUNTER — Other Ambulatory Visit: Payer: Self-pay | Admitting: Oncology

## 2019-04-24 DIAGNOSIS — C50911 Malignant neoplasm of unspecified site of right female breast: Secondary | ICD-10-CM

## 2019-04-24 MED FILL — LIDOCAINE-PRILOCAINE CREAM: 2.5-2.5 | 30 days supply | Qty: 30 | Fill #0

## 2019-04-27 ENCOUNTER — Other Ambulatory Visit: Payer: Self-pay | Admitting: Oncology

## 2019-04-27 ENCOUNTER — Other Ambulatory Visit: Payer: Self-pay

## 2019-04-27 DIAGNOSIS — M1990 Unspecified osteoarthritis, unspecified site: Secondary | ICD-10-CM

## 2019-04-27 MED ORDER — HYDROCODONE-ACETAMINOPHEN 5-325 MG PO TABS: ORAL_TABLET | ORAL | 0 refills | Status: DC

## 2019-04-28 ENCOUNTER — Ambulatory Visit: Payer: Medicare HMO | Admitting: Endocrinology

## 2019-04-28 ENCOUNTER — Emergency Department (HOSPITAL_COMMUNITY): Payer: Medicare HMO

## 2019-04-28 ENCOUNTER — Other Ambulatory Visit: Payer: Self-pay

## 2019-04-28 ENCOUNTER — Inpatient Hospital Stay (HOSPITAL_COMMUNITY)
Admission: EM | Admit: 2019-04-28 | Discharge: 2019-05-01 | DRG: 054 | Disposition: A | Discharge status: Home / Self Care | Payer: Medicare HMO | Attending: Internal Medicine | Admitting: Internal Medicine

## 2019-04-28 DIAGNOSIS — D630 Anemia in neoplastic disease: Secondary | ICD-10-CM | POA: Diagnosis present

## 2019-04-28 DIAGNOSIS — Z9221 Personal history of antineoplastic chemotherapy: Secondary | ICD-10-CM | POA: Diagnosis not present

## 2019-04-28 DIAGNOSIS — Z96652 Presence of left artificial knee joint: Secondary | ICD-10-CM | POA: Diagnosis present

## 2019-04-28 DIAGNOSIS — Z9011 Acquired absence of right breast and nipple: Secondary | ICD-10-CM | POA: Diagnosis not present

## 2019-04-28 DIAGNOSIS — I129 Hypertensive chronic kidney disease with stage 1 through stage 4 chronic kidney disease, or unspecified chronic kidney disease: Secondary | ICD-10-CM | POA: Diagnosis not present

## 2019-04-28 DIAGNOSIS — M1712 Unilateral primary osteoarthritis, left knee: Secondary | ICD-10-CM | POA: Diagnosis present

## 2019-04-28 DIAGNOSIS — L639 Alopecia areata, unspecified: Secondary | ICD-10-CM | POA: Diagnosis present

## 2019-04-28 DIAGNOSIS — E1122 Type 2 diabetes mellitus with diabetic chronic kidney disease: Secondary | ICD-10-CM | POA: Diagnosis present

## 2019-04-28 DIAGNOSIS — C7931 Secondary malignant neoplasm of brain: Principal | ICD-10-CM | POA: Diagnosis present

## 2019-04-28 DIAGNOSIS — G893 Neoplasm related pain (acute) (chronic): Secondary | ICD-10-CM | POA: Diagnosis present

## 2019-04-28 DIAGNOSIS — H547 Unspecified visual loss: Secondary | ICD-10-CM | POA: Diagnosis not present

## 2019-04-28 DIAGNOSIS — H04129 Dry eye syndrome of unspecified lacrimal gland: Secondary | ICD-10-CM | POA: Diagnosis present

## 2019-04-28 DIAGNOSIS — E039 Hypothyroidism, unspecified: Secondary | ICD-10-CM | POA: Diagnosis not present

## 2019-04-28 DIAGNOSIS — D638 Anemia in other chronic diseases classified elsewhere: Secondary | ICD-10-CM | POA: Diagnosis present

## 2019-04-28 DIAGNOSIS — Z21 Asymptomatic human immunodeficiency virus [HIV] infection status: Secondary | ICD-10-CM | POA: Diagnosis present

## 2019-04-28 DIAGNOSIS — C50919 Malignant neoplasm of unspecified site of unspecified female breast: Secondary | ICD-10-CM

## 2019-04-28 DIAGNOSIS — C7951 Secondary malignant neoplasm of bone: Secondary | ICD-10-CM | POA: Diagnosis present

## 2019-04-28 DIAGNOSIS — Z923 Personal history of irradiation: Secondary | ICD-10-CM | POA: Diagnosis not present

## 2019-04-28 DIAGNOSIS — G936 Cerebral edema: Secondary | ICD-10-CM | POA: Diagnosis not present

## 2019-04-28 DIAGNOSIS — Z6833 Body mass index (BMI) 33.0-33.9, adult: Secondary | ICD-10-CM

## 2019-04-28 DIAGNOSIS — Z20828 Contact with and (suspected) exposure to other viral communicable diseases: Secondary | ICD-10-CM | POA: Diagnosis not present

## 2019-04-28 DIAGNOSIS — Z841 Family history of disorders of kidney and ureter: Secondary | ICD-10-CM

## 2019-04-28 DIAGNOSIS — N183 Chronic kidney disease, stage 3 unspecified: Secondary | ICD-10-CM | POA: Diagnosis present

## 2019-04-28 DIAGNOSIS — B20 Human immunodeficiency virus [HIV] disease: Secondary | ICD-10-CM | POA: Diagnosis present

## 2019-04-28 DIAGNOSIS — N1831 Chronic kidney disease, stage 3a: Secondary | ICD-10-CM | POA: Diagnosis not present

## 2019-04-28 DIAGNOSIS — R29818 Other symptoms and signs involving the nervous system: Secondary | ICD-10-CM | POA: Diagnosis not present

## 2019-04-28 DIAGNOSIS — Z833 Family history of diabetes mellitus: Secondary | ICD-10-CM

## 2019-04-28 DIAGNOSIS — Z87442 Personal history of urinary calculi: Secondary | ICD-10-CM | POA: Diagnosis not present

## 2019-04-28 DIAGNOSIS — C50911 Malignant neoplasm of unspecified site of right female breast: Secondary | ICD-10-CM | POA: Diagnosis not present

## 2019-04-28 DIAGNOSIS — G510 Bell's palsy: Secondary | ICD-10-CM | POA: Diagnosis present

## 2019-04-28 DIAGNOSIS — Z91048 Other nonmedicinal substance allergy status: Secondary | ICD-10-CM | POA: Diagnosis not present

## 2019-04-28 DIAGNOSIS — I10 Essential (primary) hypertension: Secondary | ICD-10-CM | POA: Diagnosis present

## 2019-04-28 DIAGNOSIS — E669 Obesity, unspecified: Secondary | ICD-10-CM | POA: Diagnosis present

## 2019-04-28 DIAGNOSIS — Z8249 Family history of ischemic heart disease and other diseases of the circulatory system: Secondary | ICD-10-CM

## 2019-04-28 DIAGNOSIS — Z171 Estrogen receptor negative status [ER-]: Secondary | ICD-10-CM

## 2019-04-28 DIAGNOSIS — E782 Mixed hyperlipidemia: Secondary | ICD-10-CM | POA: Diagnosis present

## 2019-04-28 DIAGNOSIS — Z888 Allergy status to other drugs, medicaments and biological substances status: Secondary | ICD-10-CM

## 2019-04-28 DIAGNOSIS — H5340 Unspecified visual field defects: Secondary | ICD-10-CM | POA: Diagnosis not present

## 2019-04-28 LAB — COMPREHENSIVE METABOLIC PANEL
ALT: 14 U/L (ref 0–44)
AST: 36 U/L (ref 15–41)
Albumin: 3.5 g/dL (ref 3.5–5.0)
Alkaline Phosphatase: 65 U/L (ref 38–126)
Anion gap: 11 (ref 5–15)
BUN: 18 mg/dL (ref 8–23)
CO2: 25 mmol/L (ref 22–32)
Calcium: 9.8 mg/dL (ref 8.9–10.3)
Chloride: 103 mmol/L (ref 98–111)
Creatinine, Ser: 1.48 mg/dL — ABNORMAL HIGH (ref 0.44–1.00)
GFR calc Af Amer: 43 mL/min — ABNORMAL LOW (ref >60)
GFR calc non Af Amer: 37 mL/min — ABNORMAL LOW (ref >60)
Glucose, Bld: 91 mg/dL (ref 70–99)
Potassium: 3.8 mmol/L (ref 3.5–5.1)
Sodium: 139 mmol/L (ref 135–145)
Total Bilirubin: 0.4 mg/dL (ref 0.3–1.2)
Total Protein: 7.7 g/dL (ref 6.5–8.1)

## 2019-04-28 LAB — URINALYSIS, ROUTINE W REFLEX MICROSCOPIC
Bilirubin Urine: NEGATIVE
Glucose, UA: NEGATIVE mg/dL
Hgb urine dipstick: NEGATIVE
Ketones, ur: NEGATIVE mg/dL
Leukocytes,Ua: NEGATIVE
Nitrite: NEGATIVE
Protein, ur: NEGATIVE mg/dL
Specific Gravity, Urine: 1.012 (ref 1.005–1.030)
pH: 5 (ref 5.0–8.0)

## 2019-04-28 LAB — CBC WITH DIFFERENTIAL/PLATELET
Abs Immature Granulocytes: 0.04 10*3/uL (ref 0.00–0.07)
Basophils Absolute: 0.1 10*3/uL (ref 0.0–0.1)
Basophils Relative: 1 %
Eosinophils Absolute: 0.1 10*3/uL (ref 0.0–0.5)
Eosinophils Relative: 1 %
HCT: 35.5 % — ABNORMAL LOW (ref 36.0–46.0)
Hemoglobin: 11.3 g/dL — ABNORMAL LOW (ref 12.0–15.0)
Immature Granulocytes: 0 %
Lymphocytes Relative: 18 %
Lymphs Abs: 1.8 10*3/uL (ref 0.7–4.0)
MCH: 30.5 pg (ref 26.0–34.0)
MCHC: 31.8 g/dL (ref 30.0–36.0)
MCV: 95.7 fL (ref 80.0–100.0)
Monocytes Absolute: 1.3 10*3/uL — ABNORMAL HIGH (ref 0.1–1.0)
Monocytes Relative: 13 %
Neutro Abs: 6.7 10*3/uL (ref 1.7–7.7)
Neutrophils Relative %: 67 %
Platelets: 353 10*3/uL (ref 150–400)
RBC: 3.71 MIL/uL — ABNORMAL LOW (ref 3.87–5.11)
RDW: 16.1 % — ABNORMAL HIGH (ref 11.5–15.5)
WBC: 9.9 10*3/uL (ref 4.0–10.5)
nRBC: 0 % (ref 0.0–0.2)

## 2019-04-28 LAB — CBG MONITORING, ED: Glucose-Capillary: 74 mg/dL (ref 70–99)

## 2019-04-28 LAB — SARS CORONAVIRUS 2 (TAT 6-24 HRS): SARS Coronavirus 2: NEGATIVE

## 2019-04-28 MED ORDER — ACETAMINOPHEN 325 MG PO TABS
325.0000 mg | ORAL_TABLET | Freq: Once | ORAL | Status: AC
  Administered 2019-04-28: 325 mg via ORAL
  Filled 2019-04-28: qty 1

## 2019-04-28 MED ORDER — OLMESARTAN MEDOXOMIL-HCTZ 40-25 MG PO TABS: 1.0000 | ORAL_TABLET | Freq: Every day | ORAL | Status: DC

## 2019-04-28 MED ORDER — HYDROMORPHONE HCL 1 MG/ML IJ SOLN
1.0000 mg | Freq: Once | INTRAMUSCULAR | Status: AC
  Administered 2019-04-28: 1 mg via INTRAVENOUS
  Filled 2019-04-28: qty 1

## 2019-04-28 MED ORDER — HYDROMORPHONE HCL 1 MG/ML IJ SOLN
0.5000 mg | INTRAMUSCULAR | Status: DC | PRN
  Administered 2019-04-28 – 2019-05-01 (×9): 0.5 mg via INTRAVENOUS
  Filled 2019-04-28 (×9): qty 1

## 2019-04-28 MED ORDER — ACETAMINOPHEN 650 MG RE SUPP: 650.0000 mg | Freq: Four times a day (QID) | RECTAL | Status: DC | PRN

## 2019-04-28 MED ORDER — DOLUTEGRAVIR SODIUM 50 MG PO TABS
50.0000 mg | ORAL_TABLET | Freq: Every day | ORAL | Status: DC
  Administered 2019-04-29 – 2019-04-30 (×3): 50 mg via ORAL
  Filled 2019-04-28 (×4): qty 1

## 2019-04-28 MED ORDER — NAPROXEN 250 MG PO TABS
250.0000 mg | ORAL_TABLET | Freq: Every day | ORAL | Status: DC | PRN
  Filled 2019-04-28: qty 1

## 2019-04-28 MED ORDER — GADOBUTROL 1 MMOL/ML IV SOLN
10.0000 mL | Freq: Once | INTRAVENOUS | Status: AC | PRN
  Administered 2019-04-28: 10 mL via INTRAVENOUS

## 2019-04-28 MED ORDER — ONDANSETRON HCL 4 MG/2ML IJ SOLN
4.0000 mg | Freq: Four times a day (QID) | INTRAMUSCULAR | Status: DC | PRN
  Administered 2019-04-29: 4 mg via INTRAVENOUS
  Filled 2019-04-28: qty 2

## 2019-04-28 MED ORDER — LEVOTHYROXINE SODIUM 100 MCG PO TABS
200.0000 ug | ORAL_TABLET | Freq: Every day | ORAL | Status: DC
  Administered 2019-04-29 – 2019-05-01 (×3): 200 ug via ORAL
  Filled 2019-04-28 (×4): qty 2

## 2019-04-28 MED ORDER — ACETAMINOPHEN 325 MG PO TABS
650.0000 mg | ORAL_TABLET | Freq: Four times a day (QID) | ORAL | Status: DC | PRN
  Administered 2019-04-29: 650 mg via ORAL
  Filled 2019-04-28: qty 2

## 2019-04-28 MED ORDER — DEXAMETHASONE SODIUM PHOSPHATE 10 MG/ML IJ SOLN
10.0000 mg | Freq: Once | INTRAMUSCULAR | Status: AC
  Administered 2019-04-28: 10 mg via INTRAVENOUS
  Filled 2019-04-28: qty 1

## 2019-04-28 MED ORDER — SODIUM CHLORIDE 0.9 % IV BOLUS
500.0000 mL | Freq: Once | INTRAVENOUS | Status: AC
  Administered 2019-04-28: 500 mL via INTRAVENOUS

## 2019-04-28 MED ORDER — MARAVIROC 300 MG PO TABS
150.0000 mg | ORAL_TABLET | Freq: Two times a day (BID) | ORAL | Status: DC
  Administered 2019-04-29 – 2019-05-01 (×6): 150 mg via ORAL
  Filled 2019-04-28 (×8): qty 1

## 2019-04-28 MED ORDER — ENOXAPARIN SODIUM 40 MG/0.4ML ~~LOC~~ SOLN
40.0000 mg | SUBCUTANEOUS | Status: DC
  Administered 2019-04-28 – 2019-04-30 (×3): 40 mg via SUBCUTANEOUS
  Filled 2019-04-28 (×3): qty 0.4

## 2019-04-28 MED ORDER — MORPHINE SULFATE (PF) 2 MG/ML IV SOLN
2.0000 mg | Freq: Once | INTRAVENOUS | Status: AC
  Administered 2019-04-28: 2 mg via INTRAVENOUS
  Filled 2019-04-28: qty 1

## 2019-04-28 MED ORDER — ONDANSETRON HCL 4 MG PO TABS: 4.0000 mg | ORAL_TABLET | Freq: Four times a day (QID) | ORAL | Status: DC | PRN

## 2019-04-28 MED ORDER — RILPIVIRINE HCL 25 MG PO TABS
25.0000 mg | ORAL_TABLET | Freq: Every day | ORAL | Status: DC
  Administered 2019-04-29 (×2): 25 mg via ORAL
  Filled 2019-04-28 (×4): qty 1

## 2019-04-28 MED ORDER — DOLUTEGRAVIR-RILPIVIRINE 50-25 MG PO TABS: 1.0000 | ORAL_TABLET | Freq: Every day | ORAL | Status: DC

## 2019-04-28 MED ORDER — CHLORHEXIDINE GLUCONATE CLOTH 2 % EX PADS
6.0000 | MEDICATED_PAD | Freq: Every day | CUTANEOUS | Status: DC
  Administered 2019-04-29 – 2019-05-01 (×3): 6 via TOPICAL

## 2019-04-28 MED ORDER — DARUNAVIR-COBICISTAT 800-150 MG PO TABS
1.0000 | ORAL_TABLET | Freq: Every day | ORAL | Status: DC
  Administered 2019-04-29 – 2019-04-30 (×3): 1 via ORAL
  Filled 2019-04-28 (×4): qty 1

## 2019-04-28 MED ORDER — DEXAMETHASONE SODIUM PHOSPHATE 4 MG/ML IJ SOLN
4.0000 mg | Freq: Four times a day (QID) | INTRAMUSCULAR | Status: DC
  Administered 2019-04-29 – 2019-05-01 (×11): 4 mg via INTRAVENOUS
  Filled 2019-04-28 (×11): qty 1

## 2019-04-28 MED ORDER — SODIUM CHLORIDE 0.9% FLUSH: 10.0000 mL | INTRAVENOUS | Status: DC | PRN

## 2019-04-28 NOTE — ED Notes (Signed)
Requested tonights medications from pharmacy

## 2019-04-28 NOTE — ED Notes (Signed)
ED Provider at bedside. 

## 2019-04-28 NOTE — ED Notes (Signed)
Iv team at bedside, patient requesting ice to topically numb port site before port accessed by iv team. Ice pack provided to patient.

## 2019-04-28 NOTE — ED Provider Notes (Signed)
[facility] EMERGENCY DEPARTMENT Provider Note   CSN: [id number] Arrival date & time: 04/28/19  7517     History   Chief Complaint Chief Complaint  Patient presents with   Weakness   Vision issues    HPI Kayla Price is a 63 y.o. female history of metastatic breast cancer on chemotherapy, HIV, Bell's palsy, hypertension, hyperlipidemia, obesity, atherosclerosis presents today for vision loss.  Patient reports that for the past 3-4 weeks she has noticed worsening vision loss in her left fields.  She reports that she has had trouble reaching for things and grabbing things on the left side of her vision and frequently fumbles objects.  She reports that the left side of her vision is now completely black and she cannot see anything on that side, she reports that symptoms are in both eyes.  Triage note mentions balance issues, patient denies balance issues to me but reports that she feels unsteady with walking because she cannot see the left side of her vision and is concerned that she will bump into things on her left side.  Patient's other concern today is chronic pain from metastatic bone disease, she denies any change over the past few weeks with this "bone pain".  It is a primarily back pain, constant moderate intensity ache without aggravating or relieving  Denies fever/chills, fall/injury, headache, eye pain, slurred speech, facial droop, neck pain/stiffness, chest pain/shortness of breath, abdominal pain, nausea/vomiting, numbness/weakness, tingling or any additional concerns.    HPI  Past Medical History:  Diagnosis Date   Alopecia areata 11/28/2009   Bell's palsy    Cancer ([place]) 2005   Breast cancer   chemotherapy and radiation   CKD (chronic kidney disease) stage 3, GFR 30-59 ml/min 06/08/2015   Dry eye syndrome    Family history of lung cancer    Family history of non-Hodgkin's lymphoma    Fasting hyperglycemia    Gestational diabetes      2001   HIP FRACTURE, RIGHT 05/06/2008   History of kidney stones    HIV DISEASE 03/27/2006   HYPERLIPIDEMIA, MIXED 12/15/2007   HYPERTENSION 03/27/2006   HYPOTHYROIDISM, POST-RADIATION 06/28/2008   MENORRHAGIA, POSTMENOPAUSAL 02/03/2009   Osteoarthritis of left knee 06/08/2015   OSTEOARTHROSIS, LOCAL, SCND, UNSPC SITE 04/07/2007   Personal history of chemotherapy 11/2018   PVD 04/07/2007   Tinea capitis    TRIGGER FINGER 05/06/2008   Unspecified vitamin D deficiency 08/06/2007    Patient Active Problem List   Diagnosis Date Noted   Encounter for antineoplastic chemotherapy 02/06/2019   Port-A-Cath in place 08/01/2018   Goals of care, counseling/discussion 06/26/2018   Recurrent right pleural effusion 05/30/2018   Malignant pleural effusion 05/19/2018   Hilar adenopathy 05/15/2018   Pleural effusion on right 04/25/2018   Seasonal allergic rhinitis due to pollen 01/16/2018   Aortic atherosclerosis ([place]) 11/11/2017   Morbid obesity with BMI of 40.0-44.9, adult ([place]) 11/11/2017   Genetic testing 06/27/2017   Family history of non-Hodgkin's lymphoma    Family history of lung cancer    Endometrial thickening on ultrasound 01/25/2017   Recurrent cancer of right breast ([place]) 11/05/2016   Malignant neoplasm of upper-outer quadrant of right breast in female, estrogen receptor negative ([place]) 10/09/2016   Primary localized osteoarthritis of left knee 02/06/2016   Primary osteoarthritis of left knee 02/05/2016   Osteoarthritis of left knee 06/08/2015   CKD (chronic kidney disease) stage 3, GFR 30-59 ml/min 06/08/2015   Vitamin D deficiency 09/23/2014  Acute renal insufficiency 04/23/2014   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   Bell's palsy 12/04/2012   Contact dermatitis 08/29/2011   Dry eyes 08/20/2011   Dry mouth 08/20/2011   Neuropathy 04/09/2011   Insomnia 04/09/2011   Obesities, morbid ([place]) 09/27/2010   Endometrial polyp  12/22/2009   ALOPECIA AREATA 11/28/2009   MENORRHAGIA, POSTMENOPAUSAL 02/03/2009   ABNORMAL GLANDULAR PAPANICOLAOU SMEAR OF CERVIX 11/04/2008   ALOPECIA 05/06/2008   Trigger finger, acquired 05/06/2008   HIP FRACTURE, RIGHT 05/06/2008   ARTHROSCOPY, LEFT KNEE, HX OF 02/03/2008   HYPERLIPIDEMIA, MIXED 12/15/2007   HAND PAIN, RIGHT 12/15/2007   Other abnormal glucose 12/15/2007   UNSPECIFIED VITAMIN D DEFICIENCY 08/06/2007   TINEA CAPITIS 08/04/2007   CNTC DERMATITIS&OTH ECZEMA DUE OTH CHEM PRODUCTS 08/04/2007   PVD 04/07/2007   Secondary localized osteoarthrosis 04/07/2007   Human immunodeficiency virus (HIV) disease ([place]) 03/27/2006   Essential hypertension 03/27/2006   BREAST CANCER, HX OF 03/27/2006    Past Surgical History:  Procedure Laterality Date   BREAST LUMPECTOMY Right    2005   CHEST TUBE INSERTION Right 05/30/2018   Procedure: INSERTION PLEURAL DRAINAGE CATHETER;  Surgeon: Grace Isaac, MD;  Location: [facility] OR;  Service: Thoracic;  Laterality: Right;   COLONOSCOPY     COLONOSCOPY WITH ESOPHAGOGASTRODUODENOSCOPY (EGD)     ENDOBRONCHIAL ULTRASOUND Bilateral 05/15/2018   Procedure: ENDOBRONCHIAL ULTRASOUND;  Surgeon: Collene Gobble, MD;  Location: [facility];  Service: Cardiopulmonary;  Laterality: Bilateral;   FINE NEEDLE ASPIRATION BIOPSY  05/15/2018   Procedure: FINE NEEDLE ASPIRATION BIOPSY;  Surgeon: Collene Gobble, MD;  Location: [facility];  Service: Cardiopulmonary;;   FLEXIBLE BRONCHOSCOPY  05/15/2018   Procedure: FLEXIBLE BRONCHOSCOPY;  Surgeon: Collene Gobble, MD;  Location: [facility];  Service: Cardiopulmonary;;   HYSTEROSCOPY  2006   IR IMAGING GUIDED PORT INSERTION  07/14/2018   IR THORACENTESIS ASP PLEURAL SPACE W/IMG GUIDE  04/14/2018   IR THORACENTESIS ASP PLEURAL SPACE W/IMG GUIDE  04/28/2018   KNEE ARTHROSCOPY Left 2002   LYMPH NODE DISSECTION  2005   MASTECTOMY Right    MASTECTOMY MODIFIED RADICAL Right  11/05/2016   Procedure: RIGHT MASTECTOMY MODIFIED RADICAL;  Surgeon: Fanny Skates, MD;  Location: [facility];  Service: General;  Laterality: Right;   MODIFIED RADICAL MASTECTOMY Right 11/05/2016   placement   of port-a-cath     PLEURAL BIOPSY Right 05/30/2018   Procedure: PLEURAL BIOPSY;  Surgeon: Grace Isaac, MD;  Location: [facility];  Service: Thoracic;  Laterality: Right;   PLEURAL EFFUSION DRAINAGE Right 05/30/2018   Procedure: DRAINAGE OF PLEURAL EFFUSION;  Surgeon: Grace Isaac, MD;  Location: [facility];  Service: Thoracic;  Laterality: Right;   PORT-A-CATH REMOVAL  2006   insertion 2005   PORT-A-CATH REMOVAL N/A 03/29/2017   Procedure: REMOVAL PORT-A-CATH;  Surgeon: Fanny Skates, MD;  Location: [facility];  Service: General;  Laterality: N/A;   PORTACATH PLACEMENT Right 11/05/2016   Procedure: INSERTION PORT-A-CATH WITH ULTRA SOUND;  Surgeon: Fanny Skates, MD;  Location: [facility];  Service: General;  Laterality: Right;   removal of port a cath  12/2014   TALC PLEURODESIS Right 05/30/2018   Procedure: Pietro Cassis;  Surgeon: Grace Isaac, MD;  Location: [facility];  Service: Thoracic;  Laterality: Right;   THYROID SURGERY  2009   Ablation    TOTAL KNEE ARTHROPLASTY Left 02/06/2016   Procedure: TOTAL KNEE ARTHROPLASTY;  Surgeon: Frederik Pear, MD;  Location: [facility];  Service: Orthopedics;  Laterality: Left;  TUBAL LIGATION     VIDEO ASSISTED THORACOSCOPY Right 05/30/2018   Procedure: VIDEO ASSISTED THORACOSCOPY;  Surgeon: Grace Isaac, MD;  Location: [facility] OR;  Service: Thoracic;  Laterality: Right;     OB History    Gravida  4   Para  2   Term  2   Preterm      AB  2   Living  2     SAB      TAB  2   Ectopic      Multiple      Live Births              Home Medications    Prior to Admission medications   Medication Sig Start Date End Date Taking? Authorizing Provider  ELDERBERRY PO Take 50 mg by mouth daily.    [provider]   HYDROcodone-acetaminophen (NORCO/VICODIN) 5-325 MG tablet Take one tablet four times a day as needed for pain 04/27/19   Magrinat, Virgie Dad, MD  hydrocortisone cream 1 % Apply 1 application topically daily as needed for itching.    [provider]  JULUCA 50-25 MG TABS TAKE 1 TABLET BY MOUTH DAILY WITH BREAKFAST. TAKE WITH THE PREZCOBIX. 04/16/19   Tommy Medal, Lavell Islam, MD  levothyroxine (SYNTHROID) 200 MCG tablet Take 1 tablet (200 mcg total) by mouth daily before breakfast. 02/18/19   Renato Shin, MD  lidocaine-prilocaine (EMLA) cream APPLY 1 APPLICATION TOPICALLY AS NEEDED. APPLY TO PORT SITE 1 HOUR PRIOR TO ACCESS 04/24/19   Magrinat, Virgie Dad, MD  Loratadine 10 MG CAPS Take by mouth as needed.    [provider]  Menthol, Topical Analgesic, (BENGAY EX) Apply 1 application topically daily as needed (muscle pain).    [provider]  methocarbamol (ROBAXIN) 500 MG tablet Take 1 tablet (500 mg total) by mouth every 8 (eight) hours as needed for muscle spasms. 12/08/18   Magrinat, Virgie Dad, MD  olmesartan-hydrochlorothiazide (BENICAR HCT) 40-25 MG tablet TAKE 1 TABLET BY MOUTH DAILY. 03/09/19   Magrinat, Virgie Dad, MD  PREZCOBIX 800-150 MG tablet TAKE 1 TABLET BY MOUTH DAILY. SWALLOW WHOLE. DO NOT CRUSH, BREAK OR CHEW TABLETS. TAKE WITH FOOD. 04/16/19   Tommy Medal, Lavell Islam, MD  Propylene Glycol (SYSTANE BALANCE OP) Place 1 drop into both eyes 4 (four) times daily as needed.    [provider]  SELZENTRY 150 MG tablet TAKE ONE TABLET BY MOUTH TWICE DAILY 01/02/19   Tommy Medal, Lavell Islam, MD  Study - REPRIEVE [phone] - pitavastatin 4 mg or placebo tablet ([place]) Take 1 tablet (4 mg total) by mouth daily. 12/07/15   Truman Hayward, MD    Family History Family History  Problem Relation Age of Onset   Heart attack Brother        Massive MI in 73s   Stroke Brother        CAD   Kidney disease Mother    Stroke Mother    Diabetes Mother    Liver  disease Sister    COPD Sister        had I-131 rx of hyperthyroidism   Diabetes Sister    Stroke Sister    Lung cancer Paternal Uncle        hx smoking   Cancer Cousin    Non-Hodgkin's lymphoma Cousin 25       cancer x3, in prostate and lung- unsure if met/spread or if primaries   Heart failure Father  Heart disease Father    Arthritis Father    Sarcoidosis Sister     Social History Social History   Tobacco Use   Smoking status: Never Smoker   Smokeless tobacco: Never Used  Substance Use Topics   Alcohol use: No   Drug use: No     Allergies   Lisinopril, Pepcid [famotidine], Prilosec [omeprazole], and Tums [calcium carbonate antacid]   Review of Systems Review of Systems Ten systems are reviewed and are negative for acute change except as noted in the HPI   Physical Exam Updated Vital Signs BP 135/86    Pulse 95    Temp 98 F (36.7 C) (Oral)    Resp 12    LMP 11/06/2003    SpO2 96%   Physical Exam Constitutional:      General: She is not in acute distress.    Appearance: Normal appearance. She is well-developed. She is not ill-appearing or diaphoretic.  HENT:     Head: Normocephalic and atraumatic.     Right Ear: External ear normal.     Left Ear: External ear normal.     Nose: Nose normal.  Eyes:     General: Gaze aligned appropriately.     Extraocular Movements: Extraocular movements intact.     Conjunctiva/sclera: Conjunctivae normal.     Pupils: Pupils are equal, round, and reactive to light.     Comments: Left visual field deficit.  Neck:     Musculoskeletal: Normal range of motion.     Trachea: Trachea and phonation normal. No tracheal deviation.  Pulmonary:     Effort: Pulmonary effort is normal. No respiratory distress.  Abdominal:     General: There is no distension.     Palpations: Abdomen is soft.     Tenderness: There is no abdominal tenderness. There is no guarding or rebound.  Musculoskeletal: Normal range of motion.   Skin:    General: Skin is warm and dry.  Neurological:     Mental Status: She is alert.     GCS: GCS eye subscore is 4. GCS verbal subscore is 5. GCS motor subscore is 6.     Comments: Mental Status: Alert, oriented, thought content appropriate, able to give a coherent history. Speech fluent without evidence of aphasia. Able to follow 2 step commands without difficulty. Cranial Nerves: CH:YIFOYDXAJ left visual field deficit, pupils equal, round, reactive to light III,IV, VI: ptosis not present, extra-ocular motions intact bilaterally V,VII: smile symmetric, eyebrows raise symmetric, facial light touch sensation equal VIII: hearing grossly normal to voice X: uvula elevates symmetrically XI: bilateral shoulder shrug symmetric and strong XII: midline tongue extension without fassiculations Motor: Normal tone. 5/5 strength in upper and lower extremities bilaterally including strong and equal grip strength and dorsiflexion/plantar flexion Sensory: Sensation intact to light touch in all extremities.Negative Romberg.  Deep Tendon Reflexes: 2+ and symmetric in patella Cerebellar: normal finger-to-nose with bilateral upper extremities. Normal heel-to -shin balance bilaterally of the lower extremity. No pronator drift.  Gait: normal gait and balance CV: distal pulses palpable throughout  Psychiatric:        Behavior: Behavior normal.      ED Treatments / Results  Labs (all labs ordered are listed, but only abnormal results are displayed) Labs Reviewed  CBC WITH DIFFERENTIAL/PLATELET - Abnormal; Notable for the following components:      Result Value   RBC 3.71 (*)    Hemoglobin 11.3 (*)    HCT 35.5 (*)    RDW 16.1 (*)  Monocytes Absolute 1.3 (*)    All other components within normal limits  COMPREHENSIVE METABOLIC PANEL - Abnormal; Notable for the following components:   Creatinine, Ser 1.48 (*)    GFR calc non Af Amer 37 (*)    GFR calc Af Amer 43 (*)    All other  components within normal limits  SARS CORONAVIRUS 2 (TAT 6-24 HRS)  URINALYSIS, ROUTINE W REFLEX MICROSCOPIC  CBG MONITORING, ED    EKG None  Radiology Mr Jeri Cos And Wo Contrast  Result Date: 04/28/2019 CLINICAL DATA:  Focal neuro deficit, greater than 6 hours, stroke suspected. Additional history provided: History of breast cancer on chemotherapy currently 3 week history of blurred vision and trouble with depth perception causing difficulty ambulating. EXAM: MRI HEAD WITHOUT AND WITH CONTRAST TECHNIQUE: Multiplanar, multiecho pulse sequences of the brain and surrounding structures were obtained without and with intravenous contrast. CONTRAST:  27m GADAVIST GADOBUTROL 1 MMOL/ML IV SOLN COMPARISON:  PET-CT 03/11/2019, head CT 04/28/2007 FINDINGS: Brain: There is no evidence of acute infarct. There is a large irregular enhancing mass centered within the right occipital lobe measuring 3.8 x 2.4 x 4.0 cm (AP x TV x CC). Findings are consistent with a dominant intracranial metastasis. Curvilinear SWI signal loss at this site which may reflect a small amount of non acute hemorrhage or prominent vessels. Overlying dural thickening. Moderate surrounding vasogenic edema which extends anteriorly to involve portions of the callosal splenium. Associated mass effect with partial effacement of the posterior right lateral ventricle. 4 mm leftward midline shift. Multiple additional metastases within the left occipital lobe, the largest measuring 1.6 x 1.4 cm in transaxial dimension (series 19, image 84). The second largest lesion within the left occipital lobe measures 1.3 x 1.2 cm in transaxial dimensions (series 19, image 102). Mild to moderate surrounding vasogenic edema. There are numerous additional subcentimeter enhancing metastases within the bilateral cerebral hemispheres, right basal ganglia, pons and cerebellum, many of which have mild surrounding vasogenic edema (see annotations on postcontrast axial  sequence 19 for the largest lesions). No extra-axial fluid collection.  Cerebral volume is normal for age. Vascular: Flow voids maintained within the proximal large arterial vessels. Skull and upper cervical spine: A lytic right skull base lesion was better appreciated on PET-CT 03/11/2019. No definite lesion within the visualized cervical spine. Sinuses/Orbits: Visualized orbits demonstrate no acute abnormality. Minimal ethmoid sinus mucosal thickening. Large left maxillary sinus mucous retention cyst. No significant mastoid effusion. These results were called by telephone at the time of interpretation on 04/28/2019 at 2:49 pm to provider Dr. TGustavus Messing who verbally acknowledged these results. 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 Dominant metastasis within the right occipital lobe measuring 3.8 x 2.4 x 4.0 cm with moderate surrounding edema. Associated mass effect with partial effacement of the posterior right lateral ventricle and 4 mm leftward midline shift. Also of note, metastases are present within the left occipital lobe measuring up to 1.6 x 1.4 cm with mild surrounding vasogenic edema. Findings likely account for the patient's visual deficits. No evidence of acute infarct. Lytic right skull base lesion better appreciated on prior PET-CT. Electronically Signed   By: KKellie SimmeringDO   On: 04/28/2019 14:50    Procedures .Critical Care Performed by: MDeliah Boston PA-C Authorized by: MDeliah Boston PA-C   Critical care provider statement:    Critical care time (minutes):  31   Critical care was necessary to treat or prevent imminent or life-threatening deterioration of  the following conditions:  CNS failure or compromise   Critical care was time spent personally by me on the following activities:  Discussions with consultants, evaluation of patient's response to treatment, examination of patient, ordering and performing treatments and  interventions, ordering and review of laboratory studies, ordering and review of radiographic studies, pulse oximetry, re-evaluation of patient's condition, obtaining history from patient or surrogate, review of old charts and development of treatment plan with patient or surrogate   (including critical care time)  Medications Ordered in ED Medications  sodium chloride flush (NS) 0.9 % injection 10-40 mL (has no administration in time range)  Chlorhexidine Gluconate Cloth 2 % PADS 6 each (has no administration in time range)  morphine 2 MG/ML injection 2 mg (2 mg Intravenous Given 04/28/19 1108)  HYDROmorphone (DILAUDID) injection 1 mg (1 mg Intravenous Given 04/28/19 1236)  gadobutrol (GADAVIST) 1 MMOL/ML injection 10 mL (10 mLs Intravenous Contrast Given 04/28/19 1350)  sodium chloride 0.9 % bolus 500 mL (500 mLs Intravenous New Bag/Given 04/28/19 1503)  acetaminophen (TYLENOL) tablet 325 mg (325 mg Oral Given 04/28/19 1455)  HYDROmorphone (DILAUDID) injection 1 mg (1 mg Intravenous Given 04/28/19 1456)     Initial Impression / Assessment and Plan / ED Course  I have reviewed the triage vital signs and the nursing notes.  Pertinent labs & imaging results that were available during my care of the patient were reviewed by me and considered in my medical decision making (see chart for details).    Chart review from infectious disease office visit on 04/20/2019 shows undetectable HIV load and CD4 475, 36%. - Discussed in person with neurologist Dr. Malen Gauze who advises MRI brain with and without contrast.  Patient has been given pain medicine for her chronic pain due to her metastatic bone lesions. - CBC nonacute CMP creatinine 1.48 near baseline Urinalysis negative - MRI Brain:  IMPRESSION:  Numerous supratentorial and infratentorial intracranial metastases  as described. Dominant metastasis within the right occipital lobe  measuring 3.8 x 2.4 x 4.0 cm with moderate surrounding edema.   Associated mass effect with partial effacement of the posterior  right lateral ventricle and 4 mm leftward midline shift. Also of  note, metastases are present within the left occipital lobe  measuring up to 1.6 x 1.4 cm with mild surrounding vasogenic edema.  Findings likely account for the patient's visual deficits.    No evidence of acute infarct.    Lytic right skull base lesion better appreciated on prior PET-CT.  - Patient updated on imaging findings and plan of care she is agreeable to admission for further evaluation. - Discussed case with neurologist Dr. Malen Gauze who advised neurosurgery/oncology consultation and for possible hospitalist. - Consult placed to neurosurgery as well as hospitalist for admission and recommendations.  Awaiting return calls at this time.  Care handoff given to [facility] PA-C at shift-change; plan of care at handoff is to await phone calls from hospitalist and neurosurgery and await their recommendations.  Case was discussed with Dr. Sherry Ruffing during this visit.  Note: Portions of this report may have been transcribed using voice recognition software. Every effort was made to ensure accuracy; however, inadvertent computerized transcription errors may still be present. Final Clinical Impressions(s) / ED Diagnoses   Final diagnoses:  Metastatic cancer to brain [facility])  Visual field defect    ED Discharge Orders    None       Gari Crown 04/28/19 1540    Tegeler, Gwenyth Allegra, MD  04/28/19 1556

## 2019-04-28 NOTE — ED Notes (Signed)
Patient declined peripheral IV start, requesting to have port accessed.

## 2019-04-28 NOTE — H&P (Signed)
History and Physical    Kayla Price PYP:[id number] DOB: Jul 19, 1955 DOA: 04/28/2019  PCP: Lauree Chandler, NP  Patient coming from: Home  I have personally briefly reviewed patient's old medical records in [place]  Chief Complaint: Change in vision  HPI: Kayla Price is a 63 y.o. female with medical history significant of metastatic right breast cancer-currently on chemotherapy, HIV, hypertension, hyperlipidemia, chronic kidney disease, anemia of chronic disease presents to emergency department due to problem with vision.  Patient reports that since 3 to 4 weeks she has noticed problem with her vision especially with depth perception which affects her balance and mobility.  She is not comfortable walking due to this problem.  She also reports that she has had trouble reaching for things and grabbing things.  Reports that symptoms are in both eyes.  Denies association with headache, lightheadedness, dizziness, syncope, loss of consciousness, fall, head trauma, fever, chills, neck stiffness, rash, arthritis, chest pain, shortness of breath, palpitation, cough, congestion, decreased appetite, N/V/D or weight loss.  Patient reports that she has metastatic bone disease-she takes hydrocodone, Robaxin, Aleve and Tylenol as needed for pain control.  Patient reports that this medicine is not helping her at all.  She lives with her daughter and granddaughter and denies smoking, alcohol, nasal drug use.  She is compliant with her medication and chemo appointments.  She is followed by oncologist Dr. Sarajane Jews.  I discussed the CODE STATUS with the patient-she does not want CPR or intubation.  Okay with pressors and BiPAP.  ED Course: Upon arrival: Blood pressure slightly elevated.  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  EDP consulted neurosurgery who recommended to consult radiation oncologist.  Patient was given Decadron 10  mg IV once in the ED.  Review of Systems: As per HPI otherwise negative.    Past Medical History:  Diagnosis Date  . Alopecia areata 11/28/2009  . Bell's palsy   . Cancer [facility]) 2005   Breast cancer   chemotherapy and radiation  . CKD (chronic kidney disease) stage 3, GFR 30-59 ml/min 06/08/2015  . Dry eye syndrome   . Family history of lung cancer   . Family history of non-Hodgkin's lymphoma   . Fasting hyperglycemia   . Gestational diabetes    2001  . HIP FRACTURE, RIGHT 05/06/2008  . History of kidney stones   . HIV DISEASE 03/27/2006  . HYPERLIPIDEMIA, MIXED 12/15/2007  . HYPERTENSION 03/27/2006  . 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 MENORRHAGIA, POSTMENOPAUSAL 02/03/2009  . Osteoarthritis of left knee 06/08/2015  . OSTEOARTHROSIS, LOCAL, SCND, UNSPC SITE 04/07/2007  . Personal history of chemotherapy 11/2018  . PVD 04/07/2007  . Tinea capitis   . TRIGGER FINGER 05/06/2008  . Unspecified vitamin D deficiency 08/06/2007    Past Surgical History:  Procedure Laterality Date  . BREAST LUMPECTOMY Right    2005  . CHEST TUBE INSERTION Right 05/30/2018   Procedure: INSERTION PLEURAL DRAINAGE CATHETER;  Surgeon: Grace Isaac, MD;  Location: [place];  Service: Thoracic;  Laterality: Right;  . COLONOSCOPY    . COLONOSCOPY WITH ESOPHAGOGASTRODUODENOSCOPY (EGD)    . ENDOBRONCHIAL ULTRASOUND Bilateral 05/15/2018   Procedure: ENDOBRONCHIAL ULTRASOUND;  Surgeon: Collene Gobble, MD;  Location: [facility];  Service: Cardiopulmonary;  Laterality: Bilateral;  . FINE NEEDLE ASPIRATION BIOPSY  05/15/2018   Procedure: FINE NEEDLE ASPIRATION BIOPSY;  Surgeon: Collene Gobble, MD;  Location: [facility];  Service: Cardiopulmonary;;  . FLEXIBLE BRONCHOSCOPY  05/15/2018   Procedure: FLEXIBLE BRONCHOSCOPY;  Surgeon: Collene Gobble, MD;  Location: [facility];  Service: Cardiopulmonary;;  . HYSTEROSCOPY  2006  . IR IMAGING GUIDED PORT INSERTION  07/14/2018  . IR THORACENTESIS ASP  PLEURAL SPACE W/IMG GUIDE  04/14/2018  . IR THORACENTESIS ASP PLEURAL SPACE W/IMG GUIDE  04/28/2018  . KNEE ARTHROSCOPY Left 2002  . LYMPH NODE DISSECTION  2005  . MASTECTOMY Right   . MASTECTOMY MODIFIED RADICAL Right 11/05/2016   Procedure: RIGHT MASTECTOMY MODIFIED RADICAL;  Surgeon: Fanny Skates, MD;  Location: [place];  Service: General;  Laterality: Right;  . MODIFIED RADICAL MASTECTOMY Right 11/05/2016  . placement   of port-a-cath    . PLEURAL BIOPSY Right 05/30/2018   Procedure: PLEURAL BIOPSY;  Surgeon: Grace Isaac, MD;  Location: [place];  Service: Thoracic;  Laterality: Right;  . PLEURAL EFFUSION DRAINAGE Right 05/30/2018   Procedure: DRAINAGE OF PLEURAL EFFUSION;  Surgeon: Grace Isaac, MD;  Location: [place];  Service: Thoracic;  Laterality: Right;  . PORT-A-CATH REMOVAL  2006   insertion 2005  . PORT-A-CATH REMOVAL N/A 03/29/2017   Procedure: REMOVAL PORT-A-CATH;  Surgeon: Fanny Skates, MD;  Location: [place];  Service: General;  Laterality: N/A;  . PORTACATH PLACEMENT Right 11/05/2016   Procedure: INSERTION PORT-A-CATH WITH ULTRA SOUND;  Surgeon: Fanny Skates, MD;  Location: [place];  Service: General;  Laterality: Right;  . removal of port a cath  12/2014  . TALC PLEURODESIS Right 05/30/2018   Procedure: Pietro Cassis;  Surgeon: Grace Isaac, MD;  Location: [place];  Service: Thoracic;  Laterality: Right;  . THYROID SURGERY  2009   Ablation   . TOTAL KNEE ARTHROPLASTY Left 02/06/2016   Procedure: TOTAL KNEE ARTHROPLASTY;  Surgeon: Frederik Pear, MD;  Location: [place];  Service: Orthopedics;  Laterality: Left;  . TUBAL LIGATION    . VIDEO ASSISTED THORACOSCOPY Right 05/30/2018   Procedure: VIDEO ASSISTED THORACOSCOPY;  Surgeon: Grace Isaac, MD;  Location: [place];  Service: Thoracic;  Laterality: Right;     reports that she has never smoked. She has never used smokeless tobacco. She reports that she does not drink alcohol or use drugs.  Allergies   Allergen Reactions  . Lisinopril Anaphylaxis and Swelling    Swelling of tongue and mouth 11/05/16- tolerates Olmesartan  . Pepcid [Famotidine] Other (See Comments)    PPI H2, BLOCKERS LOWER GASTRIC PH WHICH WOULD LEAD TO SUBTHERAPEUTIC RILPIVIRINE LEVELS AND POTENTIAL VIROLOGICAL FAILURE WITH RESISTANCE  . Prilosec [Omeprazole] Other (See Comments)    PPI H2, BLOCKERS LOWER GASTRIC PH WHICH WOULD LEAD TO SUBTHERAPEUTIC RILPIVIRINE LEVELS AND POTENTIAL VIROLOGICAL FAILURE WITH RESISTANCE  . Tums [Calcium Carbonate Antacid] Other (See Comments)    TUMS ANTACIDS CAN LOWER GASTRIC PH WHICH COULD  LEAD TO SUBTHERAPEUTIC RILPIVIRINE LEVELS AND POTENTIAL VIROLOGICAL FAILURE WITH RESISTANCE TUMS CAN BE GIVEN BUT NEED CONSULT WITH ID PHARMACY RE TIMING. I PREFER HER TO AVOID ALL TOGETHER    Family History  Problem Relation Age of Onset  . Heart attack Brother        Massive MI in 70s  . Stroke Brother        CAD  . Kidney disease Mother   . Stroke Mother   . Diabetes Mother   . Liver disease Sister   . COPD Sister        had I-131 rx of hyperthyroidism  . Diabetes Sister   . Stroke Sister   . Lung cancer  Paternal Uncle        hx smoking  . Cancer Cousin   . Non-Hodgkin's lymphoma Cousin 25       cancer x3, in prostate and lung- unsure if met/spread or if primaries  . Heart failure Father   . Heart disease Father   . Arthritis Father   . Sarcoidosis Sister     Prior to Admission medications   Medication Sig Start Date End Date Taking? Authorizing Provider  acetaminophen (TYLENOL) 500 MG tablet Take 500 mg by mouth every 6 (six) hours as needed (inflammation).   Yes [provider]  ELDERBERRY PO Take 50 mg by mouth daily.   Yes [provider]  HYDROcodone-acetaminophen (NORCO/VICODIN) 5-325 MG tablet Take one tablet four times a day as needed for pain 04/27/19  Yes Magrinat, Virgie Dad, MD  hydrocortisone cream 1 % Apply 1 application topically daily as needed for  itching.   Yes [provider]  JULUCA 50-25 MG TABS TAKE 1 TABLET BY MOUTH DAILY WITH BREAKFAST. TAKE WITH THE PREZCOBIX. Patient taking differently: Take 1 tablet by mouth at bedtime. Take with Prezcobix 04/16/19  Yes Tommy Medal, Lavell Islam, MD  levothyroxine (SYNTHROID) 200 MCG tablet Take 1 tablet (200 mcg total) by mouth daily before breakfast. 02/18/19  Yes Renato Shin, MD  lidocaine-prilocaine (EMLA) cream APPLY 1 APPLICATION TOPICALLY AS NEEDED. APPLY TO PORT SITE 1 HOUR PRIOR TO ACCESS 04/24/19  Yes Magrinat, Virgie Dad, MD  Loratadine 10 MG CAPS Take 1 capsule by mouth as needed.    Yes [provider]  Menthol, Topical Analgesic, (BENGAY EX) Apply 1 application topically daily as needed (muscle pain).   Yes [provider]  methocarbamol (ROBAXIN) 500 MG tablet Take 1 tablet (500 mg total) by mouth every 8 (eight) hours as needed for muscle spasms. 12/08/18  Yes Magrinat, Virgie Dad, MD  naproxen sodium (ALEVE) 220 MG tablet Take 220 mg by mouth daily as needed (inflammation).   Yes [provider]  olmesartan-hydrochlorothiazide (BENICAR HCT) 40-25 MG tablet TAKE 1 TABLET BY MOUTH DAILY. 03/09/19  Yes Magrinat, Virgie Dad, MD  PREZCOBIX 800-150 MG tablet TAKE 1 TABLET BY MOUTH DAILY. SWALLOW WHOLE. DO NOT CRUSH, BREAK OR CHEW TABLETS. TAKE WITH FOOD. Patient taking differently: Take 1 tablet by mouth at bedtime.  04/16/19  Yes Tommy Medal, Lavell Islam, MD  Propylene Glycol (SYSTANE BALANCE OP) Place 1 drop into both eyes 4 (four) times daily as needed.   Yes [provider]  SELZENTRY 150 MG tablet TAKE ONE TABLET BY MOUTH TWICE DAILY Patient taking differently: Take 150 mg by mouth 2 (two) times daily.  01/02/19  Yes Tommy Medal, Lavell Islam, MD  Study - REPRIEVE [phone] - pitavastatin 4 mg or placebo tablet (PI-[place]) Take 1 tablet (4 mg total) by mouth daily. 12/07/15  Yes Truman Hayward, MD    Physical Exam: Vitals:   04/28/19 1630 04/28/19 1645  04/28/19 1700 04/28/19 1730  BP: 120/73  123/83 134/88  Pulse: 84 76 84 82  Resp:      Temp:      TempSrc:      SpO2: 94% 98%      Constitutional: NAD, calm, comfortable Eyes: PERRL, lids and conjunctivae normal ENMT: Mucous membranes are moist. Posterior pharynx clear of any exudate or lesions.Normal dentition.  Neck: normal, supple, no masses, no thyromegaly Respiratory: clear to auscultation bilaterally, no wheezing, no crackles. Normal respiratory effort. No accessory muscle use.  Cardiovascular: Regular  rate and rhythm, no murmurs / rubs / gallops. No extremity edema. 2+ pedal pulses. No carotid bruits.  Port on right upper chest. Abdomen: no tenderness, no masses palpated. No hepatosplenomegaly. Bowel sounds positive.  Musculoskeletal: no clubbing / cyanosis. No joint deformity upper and lower extremities. Good ROM, no contractures. Normal muscle tone.  Skin: no rashes, lesions, ulcers. No induration Neurologic: CN 2-12 grossly intact. Sensation intact, DTR normal. Strength 5/5 in all 4.  Psychiatric: Normal judgment and insight. Alert and oriented x 3. Normal mood.    Labs on Admission: I have personally reviewed following labs and imaging studies  CBC: Recent Labs  Lab 04/28/19 1054  WBC 9.9  NEUTROABS 6.7  HGB 11.3*  HCT 35.5*  MCV 95.7  PLT 062   Basic Metabolic Panel: Recent Labs  Lab 04/28/19 1054  NA 139  K 3.8  CL 103  CO2 25  GLUCOSE 91  BUN 18  CREATININE 1.48*  CALCIUM 9.8   GFR: Estimated Creatinine Clearance: 46.7 mL/min (A) (by C-G formula based on SCr of 1.48 mg/dL (H)). Liver Function Tests: Recent Labs  Lab 04/28/19 1054  AST 36  ALT 14  ALKPHOS 65  BILITOT 0.4  PROT 7.7  ALBUMIN 3.5   No results for input(s): LIPASE, AMYLASE in the last 168 hours. No results for input(s): AMMONIA in the last 168 hours. Coagulation Profile: No results for input(s): INR, PROTIME in the last 168 hours. Cardiac Enzymes: No results for input(s):  CKTOTAL, CKMB, CKMBINDEX, TROPONINI in the last 168 hours. BNP (last 3 results) No results for input(s): PROBNP in the last 8760 hours. HbA1C: No results for input(s): HGBA1C in the last 72 hours. CBG: Recent Labs  Lab 04/28/19 0952  GLUCAP 74   Lipid Profile: No results for input(s): CHOL, HDL, LDLCALC, TRIG, CHOLHDL, LDLDIRECT in the last 72 hours. Thyroid Function Tests: No results for input(s): TSH, T4TOTAL, FREET4, T3FREE, THYROIDAB in the last 72 hours. Anemia Panel: No results for input(s): VITAMINB12, FOLATE, FERRITIN, TIBC, IRON, RETICCTPCT in the last 72 hours. Urine analysis:    Component Value Date/Time   COLORURINE YELLOW 04/28/2019 1046   APPEARANCEUR CLEAR 04/28/2019 1046   LABSPEC 1.012 04/28/2019 1046   PHURINE 5.0 04/28/2019 1046   GLUCOSEU NEGATIVE 04/28/2019 1046   GLUCOSEU NEG mg/dL 07/23/2006 2113   HGBUR NEGATIVE 04/28/2019 1046   BILIRUBINUR NEGATIVE 04/28/2019 1046   KETONESUR NEGATIVE 04/28/2019 1046   PROTEINUR NEGATIVE 04/28/2019 1046   UROBILINOGEN 1 07/23/2006 2113   NITRITE NEGATIVE 04/28/2019 1046   LEUKOCYTESUR NEGATIVE 04/28/2019 1046    Radiological Exams on Admission: Mr Brain W And Wo Contrast  Result Date: 04/28/2019 CLINICAL DATA:  Focal neuro deficit, greater than 6 hours, stroke suspected. Additional history provided: History of breast cancer on chemotherapy currently 3 week history of blurred vision and trouble with depth perception causing difficulty ambulating. EXAM: MRI HEAD WITHOUT AND WITH CONTRAST TECHNIQUE: Multiplanar, multiecho pulse sequences of the brain and surrounding structures were obtained without and with intravenous contrast. CONTRAST:  75m GADAVIST GADOBUTROL 1 MMOL/ML IV SOLN COMPARISON:  PET-CT 03/11/2019, head CT 04/28/2007 FINDINGS: Brain: There is no evidence of acute infarct. There is a large irregular enhancing mass centered within the right occipital lobe measuring 3.8 x 2.4 x 4.0 cm (AP x TV x CC). Findings  are consistent with a dominant intracranial metastasis. Curvilinear SWI signal loss at this site which may reflect a small amount of non acute hemorrhage or prominent vessels. Overlying dural thickening. Moderate  surrounding vasogenic edema which extends anteriorly to involve portions of the callosal splenium. Associated mass effect with partial effacement of the posterior right lateral ventricle. 4 mm leftward midline shift. Multiple additional metastases within the left occipital lobe, the largest measuring 1.6 x 1.4 cm in transaxial dimension (series 19, image 84). The second largest lesion within the left occipital lobe measures 1.3 x 1.2 cm in transaxial dimensions (series 19, image 102). Mild to moderate surrounding vasogenic edema. There are numerous additional subcentimeter enhancing metastases within the bilateral cerebral hemispheres, right basal ganglia, pons and cerebellum, many of which have mild surrounding vasogenic edema (see annotations on postcontrast axial sequence 19 for the largest lesions). No extra-axial fluid collection.  Cerebral volume is normal for age. Vascular: Flow voids maintained within the proximal large arterial vessels. Skull and upper cervical spine: A lytic right skull base lesion was better appreciated on PET-CT 03/11/2019. No definite lesion within the visualized cervical spine. Sinuses/Orbits: Visualized orbits demonstrate no acute abnormality. Minimal ethmoid sinus mucosal thickening. Large left maxillary sinus mucous retention cyst. No significant mastoid effusion. These results were called by telephone at the time of interpretation on 04/28/2019 at 2:49 pm to provider Dr. Gustavus Messing, who verbally acknowledged these results. 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 Dominant metastasis within the right occipital lobe measuring 3.8 x 2.4 x 4.0 cm with moderate surrounding edema. Associated mass effect with partial effacement of  the posterior right lateral ventricle and 4 mm leftward midline shift. Also of note, metastases are present within the left occipital lobe measuring up to 1.6 x 1.4 cm with mild surrounding vasogenic edema. Findings likely account for the patient's visual deficits. No evidence of acute infarct. Lytic right skull base lesion better appreciated on prior PET-CT. Electronically Signed   By: Kellie Simmering DO   On: 04/28/2019 14:50    EKG: Normal sinus rhythm, no ST elevation or depression noted.  Assessment/Plan Principal Problem:   Brain metastasis (HCC) Active Problems:   Human immunodeficiency virus (HIV) disease ([place])   HTN (hypertension)   CKD (chronic kidney disease), stage III   Metastatic breast cancer (HCC)   Hypothyroidism   Anemia of chronic disease    Brain metastasis: -Patient has history of right breast cancer status post mastectomy, radiation therapy-currently on chemotherapy every other Friday.  Followed by oncologist Dr. Lavonne Chick. -MRI brain as above. -We will admit patient at [place] by neurosurgery in the ED who recommended to to consult radiation oncologist and start patient on  IV Decadron 10 mg and then on 4 mg IV every 6 hours -Consulted oncologist Dr. Lindi Adie and radiation oncologist Dr. Sondra Come. -Neurochecks every 4 hour. -On seizure precaution. -Dilaudid as needed for pain control.  Zofran as needed for nausea and vomiting.  Hypothyroidism: Check TSH -Continue levothyroxine  HIV: -Continue her home meds.  Hypertension:-Blood pressure is stable -Continue olmesartan-HCTZ.  Monitor blood pressure closely.  CKD:  -Creatinine slightly trended up from 1.35-1.48 and GFR trended down from 48-43 -Monitor.  Received IV fluids in ED.   -Repeat BMP tomorrow a.m.  Anemia of chronic disease: -H&H is stable.  No signs of bleeding. -Monitor.   DVT prophylaxis: TED/SCD/Lovenox Code Status: Partial code-no CPR, no intubation -okay with IV  pressors and BiPAP family Communication: None present at bedside.  Plan of care discussed with patient in length and she verbalized understanding and agreed with it. Disposition Plan: TBD Consults called: Radiation oncologist Dr. Sondra Come, oncologist: Dr. Lindi Adie Admission status: Inpatient at [facility]  long  Mckinley Jewel MD [facility] Pager [phone]  If 7PM-7AM, please contact night-coverage [email or web address] Password [facility]  04/28/2019, 5:55 PM

## 2019-04-28 NOTE — ED Triage Notes (Signed)
Patient c/o her depth perception off x 3 weeks. Daughter states patient has been lethargic and having balance issues. Patient c/o increased pain and feeling "off." patient is currently receiving chemo.

## 2019-04-28 NOTE — ED Provider Notes (Signed)
Care assumed from [facility], [place]. See his note for full H&P.   Per his note, "Kayla Price is a 63 y.o. female history of metastatic breast cancer on chemotherapy, HIV, Bell's palsy, hypertension, hyperlipidemia, obesity, atherosclerosis presents today for vision loss.  Patient reports that for the past 3-4 weeks she has noticed worsening vision loss in her left fields.  She reports that she has had trouble reaching for things and grabbing things on the left side of her vision and frequently fumbles objects.  She reports that the left side of her vision is now completely black and she cannot see anything on that side, she reports that symptoms are in both eyes.  Triage note mentions balance issues, patient denies balance issues to me but reports that she feels unsteady with walking because she cannot see the left side of her vision and is concerned that she will bump into things on her left side.  Patient's other concern today is chronic pain from metastatic bone disease, she denies any change over the past few weeks with this "bone pain".  It is a primarily back pain, constant moderate intensity ache without aggravating or relieving  Denies fever/chills, fall/injury, headache, eye pain, slurred speech, facial droop, neck pain/stiffness, chest pain/shortness of breath, abdominal pain, nausea/vomiting, numbness/weakness, tingling or any additional concerns."  Physical Exam  BP 112/70   Pulse 87   Temp 98 F (36.7 C) (Oral)   Resp (!) 22   LMP 11/06/2003   SpO2 97%    ED Course/Procedures     Procedures  Results for orders placed or performed during the hospital encounter of 04/28/19  CBC with Differential  Result Value Ref Range   WBC 9.9 4.0 - 10.5 K/uL   RBC 3.71 (L) 3.87 - 5.11 MIL/uL   Hemoglobin 11.3 (L) 12.0 - 15.0 g/dL   HCT 35.5 (L) 36.0 - 46.0 %   MCV 95.7 80.0 - 100.0 fL   MCH 30.5 26.0 - 34.0 pg   MCHC 31.8 30.0 - 36.0 g/dL   RDW 16.1 (H) 11.5 - 15.5 %   Platelets 353 150 - 400 K/uL   nRBC 0.0 0.0 - 0.2 %   Neutrophils Relative % 67 %   Neutro Abs 6.7 1.7 - 7.7 K/uL   Lymphocytes Relative 18 %   Lymphs Abs 1.8 0.7 - 4.0 K/uL   Monocytes Relative 13 %   Monocytes Absolute 1.3 (H) 0.1 - 1.0 K/uL   Eosinophils Relative 1 %   Eosinophils Absolute 0.1 0.0 - 0.5 K/uL   Basophils Relative 1 %   Basophils Absolute 0.1 0.0 - 0.1 K/uL   Immature Granulocytes 0 %   Abs Immature Granulocytes 0.04 0.00 - 0.07 K/uL  Comprehensive metabolic panel  Result Value Ref Range   Sodium 139 135 - 145 mmol/L   Potassium 3.8 3.5 - 5.1 mmol/L   Chloride 103 98 - 111 mmol/L   CO2 25 22 - 32 mmol/L   Glucose, Bld 91 70 - 99 mg/dL   BUN 18 8 - 23 mg/dL   Creatinine, Ser 1.48 (H) 0.44 - 1.00 mg/dL   Calcium 9.8 8.9 - 10.3 mg/dL   Total Protein 7.7 6.5 - 8.1 g/dL   Albumin 3.5 3.5 - 5.0 g/dL   AST 36 15 - 41 U/L   ALT 14 0 - 44 U/L   Alkaline Phosphatase 65 38 - 126 U/L   Total Bilirubin 0.4 0.3 - 1.2 mg/dL   GFR calc non Af  Amer 37 (L) >60 mL/min   GFR calc Af Amer 43 (L) >60 mL/min   Anion gap 11 5 - 15  Urinalysis, Routine w reflex microscopic  Result Value Ref Range   Color, Urine YELLOW YELLOW   APPearance CLEAR CLEAR   Specific Gravity, Urine 1.012 1.005 - 1.030   pH 5.0 5.0 - 8.0   Glucose, UA NEGATIVE NEGATIVE mg/dL   Hgb urine dipstick NEGATIVE NEGATIVE   Bilirubin Urine NEGATIVE NEGATIVE   Ketones, ur NEGATIVE NEGATIVE mg/dL   Protein, ur NEGATIVE NEGATIVE mg/dL   Nitrite NEGATIVE NEGATIVE   Leukocytes,Ua NEGATIVE NEGATIVE  CBG monitoring, ED  Result Value Ref Range   Glucose-Capillary 74 70 - 99 mg/dL   Comment 1 Notify RN    Comment 2 Document in Chart    Mr Jeri Cos And Wo Contrast  Result Date: 04/28/2019 CLINICAL DATA:  Focal neuro deficit, greater than 6 hours, stroke suspected. Additional history provided: History of breast cancer on chemotherapy currently 3 week history of blurred vision and trouble with depth perception  causing difficulty ambulating. EXAM: MRI HEAD WITHOUT AND WITH CONTRAST TECHNIQUE: Multiplanar, multiecho pulse sequences of the brain and surrounding structures were obtained without and with intravenous contrast. CONTRAST:  30mL GADAVIST GADOBUTROL 1 MMOL/ML IV SOLN COMPARISON:  PET-CT 03/11/2019, head CT 04/28/2007 FINDINGS: Brain: There is no evidence of acute infarct. There is a large irregular enhancing mass centered within the right occipital lobe measuring 3.8 x 2.4 x 4.0 cm (AP x TV x CC). Findings are consistent with a dominant intracranial metastasis. Curvilinear SWI signal loss at this site which may reflect a small amount of non acute hemorrhage or prominent vessels. Overlying dural thickening. Moderate surrounding vasogenic edema which extends anteriorly to involve portions of the callosal splenium. Associated mass effect with partial effacement of the posterior right lateral ventricle. 4 mm leftward midline shift. Multiple additional metastases within the left occipital lobe, the largest measuring 1.6 x 1.4 cm in transaxial dimension (series 19, image 84). The second largest lesion within the left occipital lobe measures 1.3 x 1.2 cm in transaxial dimensions (series 19, image 102). Mild to moderate surrounding vasogenic edema. There are numerous additional subcentimeter enhancing metastases within the bilateral cerebral hemispheres, right basal ganglia, pons and cerebellum, many of which have mild surrounding vasogenic edema (see annotations on postcontrast axial sequence 19 for the largest lesions). No extra-axial fluid collection.  Cerebral volume is normal for age. Vascular: Flow voids maintained within the proximal large arterial vessels. Skull and upper cervical spine: A lytic right skull base lesion was better appreciated on PET-CT 03/11/2019. No definite lesion within the visualized cervical spine. Sinuses/Orbits: Visualized orbits demonstrate no acute abnormality. Minimal ethmoid sinus mucosal  thickening. Large left maxillary sinus mucous retention cyst. No significant mastoid effusion. These results were called by telephone at the time of interpretation on 04/28/2019 at 2:49 pm to provider Dr. Gustavus Messing, who verbally acknowledged these results. 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 Dominant metastasis within the right occipital lobe measuring 3.8 x 2.4 x 4.0 cm with moderate surrounding edema. Associated mass effect with partial effacement of the posterior right lateral ventricle and 4 mm leftward midline shift. Also of note, metastases are present within the left occipital lobe measuring up to 1.6 x 1.4 cm with mild surrounding vasogenic edema. Findings likely account for the patient's visual deficits. No evidence of acute infarct. Lytic right skull base lesion better appreciated on prior PET-CT. Electronically Signed   By:  Kellie Simmering DO   On: 04/28/2019 14:50  '   MDM   Pt with a h/o metastatic breast CA, presenting for left eye field deficits bilaterally  W/u revealed MRI with sumerous supratentorial and infratentorial intracranial metastases as described. Dominant metastasis within the right occipital lobe measuring 3.8 x 2.4 x 4.0 cm with moderate surrounding edema. Associated mass effect with partial effacement of the posterior right lateral ventricle and 4 mm leftward midline shift. Also of note, metastases are present within the left occipital lobe measuring up to 1.6 x 1.4 cm with mild surrounding vasogenic edema. Findings likely account for the patient's visual deficits. No evidence of acute infarct. Lytic right skull base lesion better appreciated on prior PET-CT.   Neurology recommended neurosurgery consult and hospitalist admission.   At shift change, pending consult with neurosurgery and hospitalist service.   4:39 PM CONSULT with Dr. Annette Stable with neurosurgery who recommends giving 10mg  IV decadron now and then giving 4mg  iv  q6. Recommends consulting radiation oncology and admitting to medicine.   5:07 PM CONSULT with Dr. Doristine Bosworth with hospitalist service who accepts patient for admission.    Rodney Booze, PA-C 04/28/19 1731    Charlesetta Shanks, MD 05/06/19 [phone]

## 2019-04-28 NOTE — Progress Notes (Signed)
63 year old female with known metastatic breast carcinoma.  Patient presents with visual field deficits.  MRI scanning demonstrates evidence of multifocal cerebral metastatic disease.  Patient with bilateral occipital lobe lesions right much more significant.  No indication for operative intervention.  Patient should be evaluated by radiation oncology for [facility] versus whole brain radiation.

## 2019-04-28 NOTE — ED Notes (Signed)
Patient transported to MRI 

## 2019-04-28 NOTE — ED Notes (Signed)
Pt is NSR on monitor 

## 2019-04-29 ENCOUNTER — Encounter (HOSPITAL_COMMUNITY): Payer: Self-pay

## 2019-04-29 ENCOUNTER — Ambulatory Visit
Admit: 2019-04-29 | Discharge: 2019-04-29 | Disposition: A | Discharge status: Home / Self Care | Payer: Medicare HMO | Attending: Radiation Oncology | Admitting: Radiation Oncology

## 2019-04-29 ENCOUNTER — Ambulatory Visit
Admit: 2019-04-29 | Discharge: 2019-04-29 | Disposition: A | Discharge status: Home / Self Care | Payer: Medicare HMO | Source: Ambulatory Visit | Attending: Radiation Oncology | Admitting: Radiation Oncology

## 2019-04-29 DIAGNOSIS — E039 Hypothyroidism, unspecified: Secondary | ICD-10-CM

## 2019-04-29 DIAGNOSIS — C50919 Malignant neoplasm of unspecified site of unspecified female breast: Secondary | ICD-10-CM

## 2019-04-29 DIAGNOSIS — I10 Essential (primary) hypertension: Secondary | ICD-10-CM

## 2019-04-29 DIAGNOSIS — Z510 Encounter for antineoplastic radiation therapy: Secondary | ICD-10-CM | POA: Insufficient documentation

## 2019-04-29 DIAGNOSIS — B20 Human immunodeficiency virus [HIV] disease: Secondary | ICD-10-CM

## 2019-04-29 DIAGNOSIS — C7931 Secondary malignant neoplasm of brain: Secondary | ICD-10-CM | POA: Insufficient documentation

## 2019-04-29 DIAGNOSIS — D638 Anemia in other chronic diseases classified elsewhere: Secondary | ICD-10-CM

## 2019-04-29 DIAGNOSIS — N1831 Chronic kidney disease, stage 3a: Secondary | ICD-10-CM

## 2019-04-29 MED ORDER — IRBESARTAN 300 MG PO TABS
300.0000 mg | ORAL_TABLET | Freq: Every day | ORAL | Status: DC
  Administered 2019-04-29 – 2019-05-01 (×3): 300 mg via ORAL
  Filled 2019-04-29 (×3): qty 1

## 2019-04-29 MED ORDER — HYDROCHLOROTHIAZIDE 25 MG PO TABS
25.0000 mg | ORAL_TABLET | Freq: Every day | ORAL | Status: DC
  Administered 2019-04-29 – 2019-05-01 (×3): 25 mg via ORAL
  Filled 2019-04-29 (×3): qty 1

## 2019-04-29 NOTE — ED Notes (Signed)
ED TO INPATIENT HANDOFF REPORT  ED Nurse Name and Phone #: Kathie Rhodes RN [phone]  S Name/Age/Gender Kayla Price 63 y.o. female Room/Bed: 053C/053C  Code Status   Code Status: Partial Code  Home/SNF/Other Home Patient oriented to: self, place, time and situation Is this baseline? Yes   Triage Complete: Triage complete  Chief Complaint VISUAL ISSUES MUSCLE ACHES  Triage Note Patient c/o her depth perception off x 3 weeks. Daughter states patient has been lethargic and having balance issues. Patient c/o increased pain and feeling "off." patient is currently receiving chemo.    Allergies Allergies  Allergen Reactions  . Lisinopril Anaphylaxis and Swelling    Swelling of tongue and mouth 11/05/16- tolerates Olmesartan  . Pepcid [Famotidine] Other (See Comments)    PPI H2, BLOCKERS LOWER GASTRIC PH WHICH WOULD LEAD TO SUBTHERAPEUTIC RILPIVIRINE LEVELS AND POTENTIAL VIROLOGICAL FAILURE WITH RESISTANCE  . Prilosec [Omeprazole] Other (See Comments)    PPI H2, BLOCKERS LOWER GASTRIC PH WHICH WOULD LEAD TO SUBTHERAPEUTIC RILPIVIRINE LEVELS AND POTENTIAL VIROLOGICAL FAILURE WITH RESISTANCE  . Tums [Calcium Carbonate Antacid] Other (See Comments)    TUMS ANTACIDS CAN LOWER GASTRIC PH WHICH COULD  LEAD TO SUBTHERAPEUTIC RILPIVIRINE LEVELS AND POTENTIAL VIROLOGICAL FAILURE WITH RESISTANCE TUMS CAN BE GIVEN BUT NEED CONSULT WITH [facility] PHARM[facility] RE TIMING. I PREFER HER TO AVOID ALL TOGETHER    Level of Care/Admitting Diagnosis ED Disposition    ED Disposition Condition Comment   Admit  Hospital Area: [place] [[id number]]  Level of Care: Med-Surg [16]  Covid Evaluation: Asymptomatic Screening Protocol (No Symptoms)  Diagnosis: Brain metastasis [facility]) [[id number]]  Admitting Physician: Mckinley Jewel [phone]  Attending Physician: Mckinley Jewel [phone]  Estimated length of stay: past midnight tomorrow  Certification:: I certify this patient will need inpatient  services for at least 2 midnights  PT Class (Do Not Modify): Inpatient [101]  PT Acc Code (Do Not Modify): Private [1]       B Medical/Surgery History Past Medical History:  Diagnosis Date  . Alopecia areata 11/28/2009  . Bell's palsy   . Cancer [facility]) 2005   Breast cancer   chemotherapy and radiation  . CKD (chronic kidney disease) stage 3, GFR 30-59 ml/min 06/08/2015  . Dry eye syndrome   . Family history of lung cancer   . Family history of non-Hodgkin's lymphoma   . Fasting hyperglycemia   . Gestational diabetes    2001  . HIP FRACTURE, RIGHT 05/06/2008  . History of kidney stones   . HIV DISEASE 03/27/2006  . HYPERLIPIDEMIA, MIXED 12/15/2007  . HYPERTENSION 03/27/2006  . 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 MENORRHAGIA, POSTMENOPAUSAL 02/03/2009  . Osteoarthritis of left knee 06/08/2015  . OSTEOARTHROSIS, LOCAL, SCND, UNSPC SITE 04/07/2007  . Personal history of chemotherapy 11/2018  . PVD 04/07/2007  . Tinea capitis   . TRIGGER FINGER 05/06/2008  . Unspecified vitamin D deficiency 08/06/2007   Past Surgical History:  Procedure Laterality Date  . BREAST LUMPECTOMY Right    2005  . CHEST TUBE INSERTION Right 05/30/2018   Procedure: INSERTION PLEURAL DRAINAGE CATHETER;  Surgeon: Grace Isaac, MD;  Location: [place];  Service: Thoracic;  Laterality: Right;  . COLONOSCOPY    . COLONOSCOPY WITH ESOPHAGOGASTRODUODENOSCOPY (EGD)    . ENDOBRONCHIAL ULTRASOUND Bilateral 05/15/2018   Procedure: ENDOBRONCHIAL ULTRASOUND;  Surgeon: Collene Gobble, MD;  Location: [facility];  Service: Cardiopulmonary;  Laterality: Bilateral;  . FINE NEEDLE ASPIRATION BIOPSY  05/15/2018   Procedure: FINE  NEEDLE ASPIRATION BIOPSY;  Surgeon: Collene Gobble, MD;  Location: [facility];  Service: Cardiopulmonary;;  . FLEXIBLE BRONCHOSCOPY  05/15/2018   Procedure: FLEXIBLE BRONCHOSCOPY;  Surgeon: Collene Gobble, MD;  Location: [facility];  Service: Cardiopulmonary;;  . HYSTEROSCOPY  2006   . IR IMAGING GUIDED PORT INSERTION  07/14/2018  . IR THORACENTESIS ASP PLEURAL SPACE W/IMG GUIDE  04/14/2018  . IR THORACENTESIS ASP PLEURAL SPACE W/IMG GUIDE  04/28/2018  . KNEE ARTHROSCOPY Left 2002  . LYMPH NODE DISSECTION  2005  . MASTECTOMY Right   . MASTECTOMY MODIFIED RADICAL Right 11/05/2016   Procedure: RIGHT MASTECTOMY MODIFIED RADICAL;  Surgeon: Fanny Skates, MD;  Location: [place];  Service: General;  Laterality: Right;  . MODIFIED RADICAL MASTECTOMY Right 11/05/2016  . placement   of port-a-cath    . PLEURAL BIOPSY Right 05/30/2018   Procedure: PLEURAL BIOPSY;  Surgeon: Grace Isaac, MD;  Location: [place];  Service: Thoracic;  Laterality: Right;  . PLEURAL EFFUSION DRAINAGE Right 05/30/2018   Procedure: DRAINAGE OF PLEURAL EFFUSION;  Surgeon: Grace Isaac, MD;  Location: [place];  Service: Thoracic;  Laterality: Right;  . PORT-A-CATH REMOVAL  2006   insertion 2005  . PORT-A-CATH REMOVAL N/A 03/29/2017   Procedure: REMOVAL PORT-A-CATH;  Surgeon: Fanny Skates, MD;  Location: [place];  Service: General;  Laterality: N/A;  . PORTACATH PLACEMENT Right 11/05/2016   Procedure: INSERTION PORT-A-CATH WITH ULTRA SOUND;  Surgeon: Fanny Skates, MD;  Location: [place];  Service: General;  Laterality: Right;  . removal of port a cath  12/2014  . TALC PLEURODESIS Right 05/30/2018   Procedure: Pietro Cassis;  Surgeon: Grace Isaac, MD;  Location: [place];  Service: Thoracic;  Laterality: Right;  . THYROID SURGERY  2009   Ablation   . TOTAL KNEE ARTHROPLASTY Left 02/06/2016   Procedure: TOTAL KNEE ARTHROPLASTY;  Surgeon: Frederik Pear, MD;  Location: [place];  Service: Orthopedics;  Laterality: Left;  . TUBAL LIGATION    . VIDEO ASSISTED THORACOSCOPY Right 05/30/2018   Procedure: VIDEO ASSISTED THORACOSCOPY;  Surgeon: Grace Isaac, MD;  Location: [place];  Service: Thoracic;  Laterality: Right;     A IV Location/Drains/Wounds Patient Lines/Drains/Airways Status   Active  Line/Drains/Airways    Name:   Placement date:   Placement time:   Site:   Days:   Implanted Port 04/28/19   04/28/19    -    -   1          Intake/Output Last 24 hours No intake or output data in the 24 hours ending 04/29/19 0105  Labs/Imaging Results for orders placed or performed during the hospital encounter of 04/28/19 (from the past 48 hour(s))  CBG monitoring, ED     Status: None   Collection Time: 04/28/19  9:52 AM  Result Value Ref Range   Glucose-Capillary 74 70 - 99 mg/dL   Comment 1 Notify RN    Comment 2 Document in Chart   Urinalysis, Routine w reflex microscopic     Status: None   Collection Time: 04/28/19 10:46 AM  Result Value Ref Range   Color, Urine YELLOW YELLOW   APPearance CLEAR CLEAR   Specific Gravity, Urine 1.012 1.005 - 1.030   pH 5.0 5.0 - 8.0   Glucose, UA NEGATIVE NEGATIVE mg/dL   Hgb urine dipstick NEGATIVE NEGATIVE   Bilirubin Urine NEGATIVE NEGATIVE   Ketones, ur NEGATIVE NEGATIVE mg/dL   Protein, ur NEGATIVE NEGATIVE mg/dL  Nitrite NEGATIVE NEGATIVE   Leukocytes,Ua NEGATIVE NEGATIVE    Comment: Performed at [facility], [place] [address], [place], [place] [address]  CBC with Differential     Status: Abnormal   Collection Time: 04/28/19 10:54 AM  Result Value Ref Range   WBC 9.9 4.0 - 10.5 K/uL   RBC 3.71 (L) 3.87 - 5.11 MIL/uL   Hemoglobin 11.3 (L) 12.0 - 15.0 g/dL   HCT 35.5 (L) 36.0 - 46.0 %   MCV 95.7 80.0 - 100.0 fL   MCH 30.5 26.0 - 34.0 pg   MCHC 31.8 30.0 - 36.0 g/dL   RDW 16.1 (H) 11.5 - 15.5 %   Platelets 353 150 - 400 K/uL   nRBC 0.0 0.0 - 0.2 %   Neutrophils Relative % 67 %   Neutro Abs 6.7 1.7 - 7.7 K/uL   Lymphocytes Relative 18 %   Lymphs Abs 1.8 0.7 - 4.0 K/uL   Monocytes Relative 13 %   Monocytes Absolute 1.3 (H) 0.1 - 1.0 K/uL   Eosinophils Relative 1 %   Eosinophils Absolute 0.1 0.0 - 0.5 K/uL   Basophils Relative 1 %   Basophils Absolute 0.1 0.0 - 0.1 K/uL   Immature Granulocytes 0 %   Abs Immature  Granulocytes 0.04 0.00 - 0.07 K/uL    Comment: Performed at [facility], [place] [address], [place],  [address]  Comprehensive metabolic panel     Status: Abnormal   Collection Time: 04/28/19 10:54 AM  Result Value Ref Range   Sodium 139 135 - 145 mmol/L   Potassium 3.8 3.5 - 5.1 mmol/L   Chloride 103 98 - 111 mmol/L   CO2 25 22 - 32 mmol/L   Glucose, Bld 91 70 - 99 mg/dL   BUN 18 8 - 23 mg/dL   Creatinine, Ser 1.48 (H) 0.44 - 1.00 mg/dL   Calcium 9.8 8.9 - 10.3 mg/dL   Total Protein 7.7 6.5 - 8.1 g/dL   Albumin 3.5 3.5 - 5.0 g/dL   AST 36 15 - 41 U/L   ALT 14 0 - 44 U/L   Alkaline Phosphatase 65 38 - 126 U/L   Total Bilirubin 0.4 0.3 - 1.2 mg/dL   GFR calc non Af Amer 37 (L) >60 mL/min   GFR calc Af Amer 43 (L) >60 mL/min   Anion gap 11 5 - 15    Comment: Performed at [facility] [address] [place], [place] [address]  SARS CORONAVIRUS 2 (TAT 6-24 HRS) Nasopharyngeal Nasopharyngeal Swab     Status: None   Collection Time: 04/28/19  3:20 PM   Specimen: Nasopharyngeal Swab  Result Value Ref Range   SARS Coronavirus 2 NEGATIVE NEGATIVE    Comment: (NOTE) SARS-CoV-2 target nucleic acids are NOT DETECTED. The SARS-CoV-2 RNA is generally detectable in upper and lower respiratory specimens during the acute phase of infection. Negative results do not preclude SARS-CoV-2 infection, do not rule out co-infections with other pathogens, and should not be used as the sole basis for treatment or other patient management decisions. Negative results must be combined with clinical observations, patient history, and epidemiological information. The expected result is Negative. Fact Sheet for Patients: SugarRoll.be Fact Sheet for Healthcare Providers: https://www.woods-mathews.com/ This test is not yet approved or cleared by the [place] FDA and  has been authorized for detection and/or diagnosis of SARS-CoV-2 by FDA under  an Emergency Use Authorization (EUA). This EUA will remain  in effect (meaning  this test can be used) for the duration of the COVID-19 declaration under Section 56 4(b)(1) of the Act, 21 U.S.C. section 360bbb-3(b)(1), unless the authorization is terminated or revoked sooner. Performed at [facility], [place] [address], [place], [place] [address]    Mr Jeri Cos And Wo Contrast  Result Date: 04/28/2019 CLINICAL DATA:  Focal neuro deficit, greater than 6 hours, stroke suspected. Additional history provided: History of breast cancer on chemotherapy currently 3 week history of blurred vision and trouble with depth perception causing difficulty ambulating. EXAM: MRI HEAD WITHOUT AND WITH CONTRAST TECHNIQUE: Multiplanar, multiecho pulse sequences of the brain and surrounding structures were obtained without and with intravenous contrast. CONTRAST:  13m GADAVIST GADOBUTROL 1 MMOL/ML IV SOLN COMPARISON:  PET-CT 03/11/2019, head CT 04/28/2007 FINDINGS: Brain: There is no evidence of acute infarct. There is a large irregular enhancing mass centered within the right occipital lobe measuring 3.8 x 2.4 x 4.0 cm (AP x TV x CC). Findings are consistent with a dominant intracranial metastasis. Curvilinear SWI signal loss at this site which may reflect a small amount of non acute hemorrhage or prominent vessels. Overlying dural thickening. Moderate surrounding vasogenic edema which extends anteriorly to involve portions of the callosal splenium. Associated mass effect with partial effacement of the posterior right lateral ventricle. 4 mm leftward midline shift. Multiple additional metastases within the left occipital lobe, the largest measuring 1.6 x 1.4 cm in transaxial dimension (series 19, image 84). The second largest lesion within the left occipital lobe measures 1.3 x 1.2 cm in transaxial dimensions (series 19, image 102). Mild to moderate surrounding vasogenic edema. There are numerous additional subcentimeter  enhancing metastases within the bilateral cerebral hemispheres, right basal ganglia, pons and cerebellum, many of which have mild surrounding vasogenic edema (see annotations on postcontrast axial sequence 19 for the largest lesions). No extra-axial fluid collection.  Cerebral volume is normal for age. Vascular: Flow voids maintained within the proximal large arterial vessels. Skull and upper cervical spine: A lytic right skull base lesion was better appreciated on PET-CT 03/11/2019. No definite lesion within the visualized cervical spine. Sinuses/Orbits: Visualized orbits demonstrate no acute abnormality. Minimal ethmoid sinus mucosal thickening. Large left maxillary sinus mucous retention cyst. No significant mastoid effusion. These results were called by telephone at the time of interpretation on 04/28/2019 at 2:49 pm to provider Dr. TGustavus Messing who verbally acknowledged these results. 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 Dominant metastasis within the right occipital lobe measuring 3.8 x 2.4 x 4.0 cm with moderate surrounding edema. Associated mass effect with partial effacement of the posterior right lateral ventricle and 4 mm leftward midline shift. Also of note, metastases are present within the left occipital lobe measuring up to 1.6 x 1.4 cm with mild surrounding vasogenic edema. Findings likely account for the patient's visual deficits. No evidence of acute infarct. Lytic right skull base lesion better appreciated on prior PET-CT. Electronically Signed   By: KKellie SimmeringDO   On: 04/28/2019 14:50    Pending Labs Unresulted Labs (From admission, onward)    Start     Ordered   05/05/19 0500  Creatinine, serum  (enoxaparin (LOVENOX)    CrCl >/= 30 ml/min)  Weekly,   R    Comments: while on enoxaparin therapy   Question:  Specimen collection method  Answer:  IV Team=IV Team collect   04/28/19 1745   04/28/19 1838  TSH  Add-on,   AD    Question:  Specimen collection method  Answer:  IV Team=IV Team collect   04/28/19 1837   04/28/19 1838  Lipid panel  Add-on,   AD    Question:  Specimen collection method  Answer:  IV Team=IV Team collect   04/28/19 1837          Vitals/Pain Today's Vitals   04/28/19 2003 04/28/19 2050 04/28/19 2123 04/29/19 0023  BP:   128/84 (!) 144/100  Pulse:   90 (!) 110  Resp:   14 18  Temp:   98.4 F (36.9 C)   TempSrc:   Oral   SpO2:   98% 99%  PainSc: 8  6       Isolation Precautions No active isolations  Medications Medications  sodium chloride flush (NS) 0.9 % injection 10-40 mL (has no administration in time range)  Chlorhexidine Gluconate Cloth 2 % PADS 6 each (6 each Topical Not Given 04/28/19 1908)  enoxaparin (LOVENOX) injection 40 mg (40 mg Subcutaneous Given 04/28/19 2148)  acetaminophen (TYLENOL) tablet 650 mg (has no administration in time range)    Or  acetaminophen (TYLENOL) suppository 650 mg (has no administration in time range)  ondansetron (ZOFRAN) tablet 4 mg ( Oral See Alternative 04/29/19 0024)    Or  ondansetron (ZOFRAN) injection 4 mg (4 mg Intravenous Given 04/29/19 0024)  HYDROmorphone (DILAUDID) injection 0.5 mg (0.5 mg Intravenous Given 04/28/19 2002)  dexamethasone (DECADRON) injection 4 mg (4 mg Intravenous Not Given 04/28/19 1902)  naproxen (NAPROSYN) tablet 250 mg (has no administration in time range)  darunavir-cobicistat (PREZCOBIX) 800-150 MG per tablet 1 tablet (has no administration in time range)  maraviroc (SELZENTRY) tablet 150 mg (has no administration in time range)  olmesartan-hydrochlorothiazide (BENICAR HCT) 40-25 MG per tablet 1 tablet (0 tablets Oral Hold 04/28/19 2230)  levothyroxine (SYNTHROID) tablet 200 mcg (has no administration in time range)  dolutegravir (TIVICAY) tablet 50 mg (has no administration in time range)    And  rilpivirine (EDURANT) tablet 25 mg (has no administration in time range)  morphine 2 MG/ML injection 2 mg (2 mg  Intravenous Given 04/28/19 1108)  HYDROmorphone (DILAUDID) injection 1 mg (1 mg Intravenous Given 04/28/19 1236)  gadobutrol (GADAVIST) 1 MMOL/ML injection 10 mL (10 mLs Intravenous Contrast Given 04/28/19 1350)  sodium chloride 0.9 % bolus 500 mL (0 mLs Intravenous Stopped 04/28/19 1958)  acetaminophen (TYLENOL) tablet 325 mg (325 mg Oral Given 04/28/19 1455)  HYDROmorphone (DILAUDID) injection 1 mg (1 mg Intravenous Given 04/28/19 1456)  dexamethasone (DECADRON) injection 10 mg (10 mg Intravenous Given 04/28/19 1721)    Mobility walks High fall risk   Focused Assessments Pulmonary Assessment Handoff:  Lung sounds:   O2 Device: Room Air        R Recommendations: See Admitting Provider Note  Report given to:   Additional Notes:  Pt alert and oriented

## 2019-04-29 NOTE — Progress Notes (Addendum)
PROGRESS NOTE    Kayla Price  ZOX:[id number]  DOB: 01/28/1956  DOA: 04/28/2019 PCP: Lauree Chandler, NP  Brief Narrative: 63 y.o. 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 She states it takes a minute to focus on objects to see them clearly. 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  Dominant metastasis within the right occipital lobe measures 3.8 x 2.4 x 2 4.0 cm with surrounding edema, associated mass-effect with partial effacement of the posterior right lateral ventricle and 4 mm leftward midline shift.  Metastases are also present in the left occipital lobe measuring up to 1.6 x 1.4 cm with mild surrounding vasogenic edema. She was started on dexamethasone.  Case was reviewed by neurosurgery and recommended Radiation oncology consult for [facility] versus whole brain radiation;no indication for surgical intervention. Patient transferred to [place]  Subjective:  Patient seen by oncology/radiation oncology this morning.  Underwent radiation therapy.  Tolerated well.  Eating lunch when seen in rounds.  States visual changes somewhat improved today.  Denies any speech changes or focal weakness or numbness or tingling.  Objective: Vitals:   04/28/19 1730 04/28/19 2123 04/29/19 0023 04/29/19 0649  BP: 134/88 128/84 (!) 144/100 128/85  Pulse: 82 90 (!) 110 78  Resp:  14 18 18   Temp:  98.4 F (36.9 C)  97.9 F (36.6 C)  TempSrc:  Oral  Oral  SpO2:  98% 99% 97%    Intake/Output Summary (Last 24 hours) at 04/29/2019 1418 Last data filed at  04/29/2019 0900 Gross per 24 hour  Intake 240 ml  Output --  Net 240 ml   There were no vitals filed for this visit.  Physical Examination:  General exam: Appears calm and comfortable  HEENT: Atraumatic normocephalic, reports visual changes but extraocular movements intact, PERRLA, no ptosis Respiratory system: Clear to auscultation although decreased breath sounds to right base. Respiratory effort normal. Cardiovascular system: S1 & S2 heard, RRR. No JVD, murmurs, rubs, gallops or clicks. No pedal edema. Gastrointestinal system: Abdomen is nondistended, soft and nontender. No organomegaly or masses felt. Normal bowel sounds heard. Central nervous system: Alert and oriented. No focal neurological deficits. Extremities: Symmetric 5 x 5 power. Skin: No rashes, lesions or ulcers Psychiatry: Judgement and insight appear normal. Mood & affect appropriate.   Data Reviewed: I have personally reviewed following labs and imaging studies  CBC: Recent Labs  Lab 04/28/19 1054  WBC 9.9  NEUTROABS 6.7  HGB 11.3*  HCT 35.5*  MCV 95.7  PLT 811   Basic Metabolic Panel: Recent Labs  Lab 04/28/19 1054  NA 139  K 3.8  CL 103  CO2 25  GLUCOSE 91  BUN 18  CREATININE 1.48*  CALCIUM 9.8   GFR: Estimated Creatinine Clearance: 46.7 mL/min (A) (by C-G formula based on SCr of 1.48 mg/dL (H)). Liver Function Tests: Recent Labs  Lab 04/28/19 1054  AST 36  ALT 14  ALKPHOS 65  BILITOT 0.4  PROT 7.7  ALBUMIN 3.5   No results for input(s): LIPASE, AMYLASE in the last 168 hours. No results for input(s): AMMONIA in the last 168 hours. Coagulation Profile: No results for input(s): INR,  PROTIME in the last 168 hours. Cardiac Enzymes: No results for input(s): CKTOTAL, CKMB, CKMBINDEX, TROPONINI in the last 168 hours. BNP (last 3 results) No results for input(s): PROBNP in the last 8760 hours. HbA1C: No results for input(s): HGBA1C in the last 72 hours. CBG: Recent Labs  Lab  04/28/19 0952  GLUCAP 74   Lipid Profile: No results for input(s): CHOL, HDL, LDLCALC, TRIG, CHOLHDL, LDLDIRECT in the last 72 hours. Thyroid Function Tests: No results for input(s): TSH, T4TOTAL, FREET4, T3FREE, THYROIDAB in the last 72 hours. Anemia Panel: No results for input(s): VITAMINB12, FOLATE, FERRITIN, TIBC, IRON, RETICCTPCT in the last 72 hours. Sepsis Labs: No results for input(s): PROCALCITON, LATICACIDVEN in the last 168 hours.  Recent Results (from the past 240 hour(s))  SARS CORONAVIRUS 2 (TAT 6-24 HRS) Nasopharyngeal Nasopharyngeal Swab     Status: None   Collection Time: 04/28/19  3:20 PM   Specimen: Nasopharyngeal Swab  Result Value Ref Range Status   SARS Coronavirus 2 NEGATIVE NEGATIVE Final    Comment: (NOTE) SARS-CoV-2 target nucleic acids are NOT DETECTED. The SARS-CoV-2 RNA is generally detectable in upper and lower respiratory specimens during the acute phase of infection. Negative results do not preclude SARS-CoV-2 infection, do not rule out co-infections with other pathogens, and should not be used as the sole basis for treatment or other patient management decisions. Negative results must be combined with clinical observations, patient history, and epidemiological information. The expected result is Negative. Fact Sheet for Patients: SugarRoll.be Fact Sheet for Healthcare Providers: https://www.woods-mathews.com/ This test is not yet approved or cleared by the [place] FDA and  has been authorized for detection and/or diagnosis of SARS-CoV-2 by FDA under an Emergency Use Authorization (EUA). This EUA will remain  in effect (meaning this test can be used) for the duration of the COVID-19 declaration under Section 56 4(b)(1) of the Act, 21 U.S.C. section 360bbb-3(b)(1), unless the authorization is terminated or revoked sooner. Performed at [facility], [place] [address], [place], [place] [address]        Radiology Studies: Mr Jeri Cos And Wo Contrast  Result Date: 04/28/2019 CLINICAL DATA:  Focal neuro deficit, greater than 6 hours, stroke suspected. Additional history provided: History of breast cancer on chemotherapy currently 3 week history of blurred vision and trouble with depth perception causing difficulty ambulating. EXAM: MRI HEAD WITHOUT AND WITH CONTRAST TECHNIQUE: Multiplanar, multiecho pulse sequences of the brain and surrounding structures were obtained without and with intravenous contrast. CONTRAST:  78mL GADAVIST GADOBUTROL 1 MMOL/ML IV SOLN COMPARISON:  PET-CT 03/11/2019, head CT 04/28/2007 FINDINGS: Brain: There is no evidence of acute infarct. There is a large irregular enhancing mass centered within the right occipital lobe measuring 3.8 x 2.4 x 4.0 cm (AP x TV x CC). Findings are consistent with a dominant intracranial metastasis. Curvilinear SWI signal loss at this site which may reflect a small amount of non acute hemorrhage or prominent vessels. Overlying dural thickening. Moderate surrounding vasogenic edema which extends anteriorly to involve portions of the callosal splenium. Associated mass effect with partial effacement of the posterior right lateral ventricle. 4 mm leftward midline shift. Multiple additional metastases within the left occipital lobe, the largest measuring 1.6 x 1.4 cm in transaxial dimension (series 19, image 84). The second largest lesion within the left occipital lobe measures 1.3 x 1.2 cm in transaxial dimensions (series 19, image 102). Mild to moderate surrounding vasogenic edema. There are numerous additional subcentimeter enhancing metastases within the bilateral cerebral  hemispheres, right basal ganglia, pons and cerebellum, many of which have mild surrounding vasogenic edema (see annotations on postcontrast axial sequence 19 for the largest lesions). No extra-axial fluid collection.  Cerebral volume is normal for age. Vascular: Flow voids maintained  within the proximal large arterial vessels. Skull and upper cervical spine: A lytic right skull base lesion was better appreciated on PET-CT 03/11/2019. No definite lesion within the visualized cervical spine. Sinuses/Orbits: Visualized orbits demonstrate no acute abnormality. Minimal ethmoid sinus mucosal thickening. Large left maxillary sinus mucous retention cyst. No significant mastoid effusion. These results were called by telephone at the time of interpretation on 04/28/2019 at 2:49 pm to provider Dr. Gustavus Messing, who verbally acknowledged these results. 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 Dominant metastasis within the right occipital lobe measuring 3.8 x 2.4 x 4.0 cm with moderate surrounding edema. Associated mass effect with partial effacement of the posterior right lateral ventricle and 4 mm leftward midline shift. Also of note, metastases are present within the left occipital lobe measuring up to 1.6 x 1.4 cm with mild surrounding vasogenic edema. Findings likely account for the patient's visual deficits. No evidence of acute infarct. Lytic right skull base lesion better appreciated on prior PET-CT. Electronically Signed   By: Kellie Simmering DO   On: 04/28/2019 14:50        Scheduled Meds:  Chlorhexidine Gluconate Cloth  6 each Topical Daily   darunavir-cobicistat  1 tablet Oral QHS   dexamethasone (DECADRON) injection  4 mg Intravenous Q6H   dolutegravir  50 mg Oral QHS   And   rilpivirine  25 mg Oral QHS   enoxaparin (LOVENOX) injection  40 mg Subcutaneous Q24H   irbesartan  300 mg Oral Daily   And   hydrochlorothiazide  25 mg Oral Daily   levothyroxine  200 mcg Oral Q0600   maraviroc  150 mg Oral BID   Continuous Infusions:  Assessment & Plan:    1.  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  Planned for inpatient radiation treatment sessions--patient states she was told she will need at least 10 treatments and will stay as inpatient through the weekend.  Discussed with oncology APP earlier this morning.  They will follow along.  Continue Decadron.  Mental status okay and oriented x3.  Denies any headaches this morning.  No evidence of seizures and not on any prophylactic antiepileptics currently.  2.  Right breast cancer: Diagnosed in 2005, s/p lumpectomy with recurrence in 2018 requiring right mastectomy and also complicated by right lung/pleural mets, recurrent pleural effusions requiring thoracentesis/pleurodesis. On chemotherapy as outpatient and next treatment was supposed to be on 11/6 but will likely need to be postponed per oncology.  3.  HIV: Last CD4 count in record from 2012 per my review at 718.  Resumed on home medications.  Follows Dr. Tommy Medal  4.  Hypertension: On irbesartan, HCTZ.   5.  CKD stage HFW:YOVZCHYIFO appears stable and at baseline around 1.3-1.4 in the setting of diuretic/ACEI  6.  Hypothyroidism: Continue Synthroid  7.  Anemia of chronic disease: Hemoglobin stable around 11.5  DVT prophylaxis: Lovenox Code Status: Partial (okay for IPPV/BiPAP and pressors/ACLS medications but does not want intubation/mechanical ventilation/CPR/defibrillation) Family / Patient Communication: Discussed with patient Disposition Plan: Home when medically cleared by oncology/radiation oncology     LOS: 1 day    Time spent: 35 minutes  Guilford Shi, MD [facility] Pager [phone]  If 7PM-7AM, please contact night-coverage [email or web address] Password [email or web address] 04/29/2019, 2:18 PM

## 2019-04-29 NOTE — ED Notes (Signed)
Placed pt on hospital bed for comfort

## 2019-04-29 NOTE — Progress Notes (Addendum)
  Radiation Oncology         [phone] ________________________________  Name: Kayla Price MRN: [id number]  Date: 04/29/2019  DOB: 06-16-1956  SIMULATION AND TREATMENT PLANNING NOTE-inpatient    ICD-10-CM   1. 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  Identity was confirmed.  All relevant records and images related to the planned course of therapy were reviewed.  The patient freely provided informed written consent to proceed with treatment after reviewing the details related to the planned course of therapy. The consent form was witnessed and verified by the simulation staff.  Then, the patient was set-up in a stable reproducible  supine position for radiation therapy.  CT images were obtained.  Surface markings were placed.  The CT images were loaded into the planning software.  Then the target and avoidance structures were contoured.  Treatment planning then occurred.  The radiation prescription was entered and confirmed.  Then, I designed and supervised the construction of a total of 3 medically necessary complex treatment devices.  I have requested : Isodose Plan.  I have ordered: dose calc.  PLAN:  The patient will receive 30 Gy in 10 fractions directed at the whole brain. She will begin her treatments tomorrow as an inpatient.  -----------------------------------  Blair Promise, PhD, MD  This document serves as a record of services personally performed by Gery Pray, MD. It was created on his behalf by Wilburn Mylar, a trained medical scribe. The creation of this record is based on the scribe's personal observations and the provider's statements to them. This document has been checked and approved by the attending provider.

## 2019-04-29 NOTE — ED Notes (Signed)
Regular diet breakfast tray ordered

## 2019-04-29 NOTE — Progress Notes (Signed)
Radiation Oncology         [phone] ________________________________  Initial Inpatient Consultation  Name: Kayla Price MRN: [id number]  Date: 04/29/2019  DOB: 25-Nov-1955  AG:TXMIWOE, Kayla American, NP  Lauree Chandler, NP   REFERRING PHYSICIAN: Lauree Chandler, NP  DIAGNOSIS: The encounter diagnosis was Brain metastasis [facility]).  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  HISTORY OF PRESENT ILLNESS::Kayla Price is a 63 y.o. female who is accompanied by no one due to COVID-19 restrictions. She is being seen out of curtesy of Dr. Earnest Conroy, ED, for new brain metastases. She has a history of right-sided breast cancer that dates back to 2005. She had a lumpectomy with sentinel lymph node sampling, chemotherapy, and radiation. Note, patient is HIV positive and is being followed by Dr. Tommy Medal, ID.  On 09/25/2016, she had a bilateral screening mammography which showed a possible mass in the right breast. Biopsy on 10/01/2016 of right breast mass and suspicious axillary lymph node was positive for invasive ductal carcinoma, grade 3, ER/PR negative. Right modified radical mastectomy was performed on 11/05/2016. She was not a candidate for radiation at that time.  Adjuvant chemotherapy consisting of carboplatin and gemcitabine given days 1 and 8 of each 21 day cycle, for 6 cycles, starting 11/20/2016, completed 03/11/2017. Genetic testing on 06/17/2017 showed no pathogenic mutations.  She was seen at Urgent Care and the ED on 04/14/2018 for shortness of breath. Chest x-ray showed right pleural effusion. Right thoracenteses on 04/14/2018 and 04/28/2018 showed atypical cells, non-diagnostic. CT of chest on 04/22/2018 showed re-accumulation of fluid and right pleural nodularity. PET scan on 05/01/2018 showed hypermetabolic pleural based metastases, right CP angle nodal  metastases; no evidence of malignancy in abdomen/pelvis. Bronchoscopy with biopsy on 05/15/2018 was non-diagnostic. VAT biopsy of the right pleura on 05/30/2018 confirmed carcinoma, triple negative. [facility] shows PD-L1 positive(1% in [facility]); otherwise microsatellite stable, TMB low (5/Mb), no PIK3 mutations; other mutations suggest sensitivity to MTOR inhibitors and several TKIs.  Atezolizumab was started on 07/15/2018 and discontinued on 12/05/2018. PET scan on 08/13/2018 showed right hemithorax tumor. PET scan on 11/28/2018 showed progression in the right lung and pleural area as well as lymph nodes and a T1 skeletal metastasis. Mammogram of left breast on 12/25/2018 showed no evidence of malignancy. PET scan (skull base to thigh) on 03/11/2019 showed generalized mild to moderate improvement in thoracic and upper abdominal adenopathy and soft tissue metastatic lesions including the right pleural space; but with multiple new osseous metastatic lesions identified. New lytic metastatic lesions including in the right skull base just lateral to the foramen magnum; at L5; in both iliac bones; and in both femoral heads.  Patient has been followed by Dr. Jana Hakim, last follow up on 04/03/2019. She was started on Eribulin on 12/19/2018 and has been tolerating it well. Patient has had nine doses of treatment. Zometa started on 03/25/2019.  Patient was seen in ED on 04/28/2019 for changes in vision bilaterally ongoing for 3-4 weeks. MRI of brain on 04/28/2019 showed numerous supratentorial and infratentorial intracranial metastases. Dominant metastasis within the right occipital lobe measuring 3.8 x 2.4 x 4.0 cm with moderate surrounding edema. Associated mass effect with partial effacement of the posterior right lateral ventricle and 4 mm leftward midline shift. Also of note, metastases are present within the left occipital lobe measuring up to 1.6 x 1.4 cm with mild  surrounding vasogenic edema. Patient was  admitted to hospital.   PREVIOUS RADIATION THERAPY: Yes.  May of 2006: She received 28 fractions to the right breast, right supraclavicular nodes, and right axillar nodes by Dr. Tammi Klippel through the posterior axilla. She also had a 10 Gy boost to the lumpectomy cavity.   PAST MEDICAL HISTORY:  Past Medical History:  Diagnosis Date   Alopecia areata 11/28/2009   Bell's palsy    Cancer ([place]) 2005   Breast cancer   chemotherapy and radiation   CKD (chronic kidney disease) stage 3, GFR 30-59 ml/min 06/08/2015   Dry eye syndrome    Family history of lung cancer    Family history of non-Hodgkin's lymphoma    Fasting hyperglycemia    Gestational diabetes    2001   HIP FRACTURE, RIGHT 05/06/2008   History of kidney stones    HIV DISEASE 03/27/2006   HYPERLIPIDEMIA, MIXED 12/15/2007   HYPERTENSION 03/27/2006   HYPOTHYROIDISM, POST-RADIATION 06/28/2008   MENORRHAGIA, POSTMENOPAUSAL 02/03/2009   Osteoarthritis of left knee 06/08/2015   OSTEOARTHROSIS, LOCAL, SCND, UNSPC SITE 04/07/2007   Personal history of chemotherapy 11/2018   PVD 04/07/2007   Tinea capitis    TRIGGER FINGER 05/06/2008   Unspecified vitamin D deficiency 08/06/2007    PAST SURGICAL HISTORY: Past Surgical History:  Procedure Laterality Date   BREAST LUMPECTOMY Right    2005   CHEST TUBE INSERTION Right 05/30/2018   Procedure: INSERTION PLEURAL DRAINAGE CATHETER;  Surgeon: Grace Isaac, MD;  Location: [facility] OR;  Service: Thoracic;  Laterality: Right;   COLONOSCOPY     COLONOSCOPY WITH ESOPHAGOGASTRODUODENOSCOPY (EGD)     ENDOBRONCHIAL ULTRASOUND Bilateral 05/15/2018   Procedure: ENDOBRONCHIAL ULTRASOUND;  Surgeon: Collene Gobble, MD;  Location: [facility] ENDOSCOPY;  Service: Cardiopulmonary;  Laterality: Bilateral;   FINE NEEDLE ASPIRATION BIOPSY  05/15/2018   Procedure: FINE NEEDLE ASPIRATION BIOPSY;  Surgeon: Collene Gobble, MD;  Location: [facility] ENDOSCOPY;  Service: Cardiopulmonary;;   FLEXIBLE  BRONCHOSCOPY  05/15/2018   Procedure: FLEXIBLE BRONCHOSCOPY;  Surgeon: Collene Gobble, MD;  Location: [facility] ENDOSCOPY;  Service: Cardiopulmonary;;   HYSTEROSCOPY  2006   IR IMAGING GUIDED PORT INSERTION  07/14/2018   IR THORACENTESIS ASP PLEURAL SPACE W/IMG GUIDE  04/14/2018   IR THORACENTESIS ASP PLEURAL SPACE W/IMG GUIDE  04/28/2018   KNEE ARTHROSCOPY Left 2002   LYMPH NODE DISSECTION  2005   MASTECTOMY Right    MASTECTOMY MODIFIED RADICAL Right 11/05/2016   Procedure: RIGHT MASTECTOMY MODIFIED RADICAL;  Surgeon: Fanny Skates, MD;  Location: [place];  Service: General;  Laterality: Right;   MODIFIED RADICAL MASTECTOMY Right 11/05/2016   placement   of port-a-cath     PLEURAL BIOPSY Right 05/30/2018   Procedure: PLEURAL BIOPSY;  Surgeon: Grace Isaac, MD;  Location: [place];  Service: Thoracic;  Laterality: Right;   PLEURAL EFFUSION DRAINAGE Right 05/30/2018   Procedure: DRAINAGE OF PLEURAL EFFUSION;  Surgeon: Grace Isaac, MD;  Location: [facility] OR;  Service: Thoracic;  Laterality: Right;   PORT-A-CATH REMOVAL  2006   insertion 2005   PORT-A-CATH REMOVAL N/A 03/29/2017   Procedure: REMOVAL PORT-A-CATH;  Surgeon: Fanny Skates, MD;  Location: [place];  Service: General;  Laterality: N/A;   PORTACATH PLACEMENT Right 11/05/2016   Procedure: INSERTION PORT-A-CATH WITH ULTRA SOUND;  Surgeon: Fanny Skates, MD;  Location: [place];  Service: General;  Laterality: Right;   removal of port a cath  12/2014   TALC PLEURODESIS Right 05/30/2018   Procedure: Bo Merino  PLEURADESIS;  Surgeon: Grace Isaac, MD;  Location: [place];  Service: Thoracic;  Laterality: Right;   THYROID SURGERY  2009   Ablation    TOTAL KNEE ARTHROPLASTY Left 02/06/2016   Procedure: TOTAL KNEE ARTHROPLASTY;  Surgeon: Frederik Pear, MD;  Location: [place];  Service: Orthopedics;  Laterality: Left;   TUBAL LIGATION     VIDEO ASSISTED THORACOSCOPY Right 05/30/2018   Procedure: VIDEO ASSISTED THORACOSCOPY;  Surgeon:  Grace Isaac, MD;  Location: [facility] OR;  Service: Thoracic;  Laterality: Right;    FAMILY HISTORY:  Family History  Problem Relation Age of Onset   Heart attack Brother        Massive MI in 2s   Stroke Brother        CAD   Kidney disease Mother    Stroke Mother    Diabetes Mother    Liver disease Sister    COPD Sister        had I-131 rx of hyperthyroidism   Diabetes Sister    Stroke Sister    Lung cancer Paternal Uncle        hx smoking   Cancer Cousin    Non-Hodgkin's lymphoma Cousin 25       cancer x3, in prostate and lung- unsure if met/spread or if primaries   Heart failure Father    Heart disease Father    Arthritis Father    Sarcoidosis Sister     SOCIAL HISTORY:  Social History   Tobacco Use   Smoking status: Never Smoker   Smokeless tobacco: Never Used  Substance Use Topics   Alcohol use: No   Drug use: No    ALLERGIES:  Allergies  Allergen Reactions   Lisinopril Anaphylaxis and Swelling    Swelling of tongue and mouth 11/05/16- tolerates Olmesartan   Pepcid [Famotidine] Other (See Comments)    PPI H2, BLOCKERS LOWER GASTRIC PH WHICH WOULD LEAD TO SUBTHERAPEUTIC RILPIVIRINE LEVELS AND POTENTIAL VIROLOGICAL FAILURE WITH RESISTANCE   Prilosec [Omeprazole] Other (See Comments)    PPI H2, BLOCKERS LOWER GASTRIC PH WHICH WOULD LEAD TO SUBTHERAPEUTIC RILPIVIRINE LEVELS AND POTENTIAL VIROLOGICAL FAILURE WITH RESISTANCE   Tums [Calcium Carbonate Antacid] Other (See Comments)    TUMS ANTACIDS CAN LOWER GASTRIC PH WHICH COULD  LEAD TO SUBTHERAPEUTIC RILPIVIRINE LEVELS AND POTENTIAL VIROLOGICAL FAILURE WITH RESISTANCE TUMS CAN BE GIVEN BUT NEED CONSULT WITH ID PHARMACY RE TIMING. I PREFER HER TO AVOID ALL TOGETHER    MEDICATIONS:  No current facility-administered medications for this encounter.    No current outpatient medications on file.   Facility-Administered Medications Ordered in Other Encounters  Medication Dose Route  Frequency Provider Last Rate Last Dose   acetaminophen (TYLENOL) tablet 650 mg  650 mg Oral Q6H PRN Pahwani, Rinka R, MD   650 mg at 04/29/19 0142   Or   acetaminophen (TYLENOL) suppository 650 mg  650 mg Rectal Q6H PRN Pahwani, Rinka R, MD       Chlorhexidine Gluconate Cloth 2 % PADS 6 each  6 each Topical Daily Tegeler, Gwenyth Allegra, MD   6 each at 04/29/19 1147   darunavir-cobicistat (PREZCOBIX) 800-150 MG per tablet 1 tablet  1 tablet Oral QHS Pahwani, Rinka R, MD   1 tablet at 04/29/19 0128   dexamethasone (DECADRON) injection 4 mg  4 mg Intravenous Q6H Pahwani, Rinka R, MD   4 mg at 04/29/19 1836   dolutegravir (TIVICAY) tablet 50 mg  50 mg Oral QHS Pahwani, Michell Heinrich, MD  50 mg at 04/29/19 0127   And   rilpivirine (EDURANT) tablet 25 mg  25 mg Oral QHS Pahwani, Rinka R, MD   25 mg at 04/29/19 0126   enoxaparin (LOVENOX) injection 40 mg  40 mg Subcutaneous Q24H Pahwani, Rinka R, MD   40 mg at 04/29/19 1837   irbesartan (AVAPRO) tablet 300 mg  300 mg Oral Daily Guilford Shi, MD   300 mg at 04/29/19 1151   And   hydrochlorothiazide (HYDRODIURIL) tablet 25 mg  25 mg Oral Daily Guilford Shi, MD   25 mg at 04/29/19 1152   HYDROmorphone (DILAUDID) injection 0.5 mg  0.5 mg Intravenous Q4H PRN Pahwani, Rinka R, MD   0.5 mg at 04/29/19 1621   levothyroxine (SYNTHROID) tablet 200 mcg  200 mcg Oral Q0600 Pahwani, Rinka R, MD   200 mcg at 04/29/19 0816   maraviroc (SELZENTRY) tablet 150 mg  150 mg Oral BID Pahwani, Rinka R, MD   150 mg at 04/29/19 1152   naproxen (NAPROSYN) tablet 250 mg  250 mg Oral Daily PRN Pahwani, Rinka R, MD       ondansetron (ZOFRAN) tablet 4 mg  4 mg Oral Q6H PRN Pahwani, Rinka R, MD       Or   ondansetron (ZOFRAN) injection 4 mg  4 mg Intravenous Q6H PRN Pahwani, Rinka R, MD   4 mg at 04/29/19 0024   sodium chloride flush (NS) 0.9 % injection 10-40 mL  10-40 mL Intracatheter PRN Tegeler, Gwenyth Allegra, MD        REVIEW OF SYSTEMS:  A 10+ POINT  REVIEW OF SYSTEMS WAS OBTAINED including neurology, dermatology, psychiatry, cardiac, respiratory, lymph, extremities, GI, GU, musculoskeletal, constitutional, reproductive, HEENT.  Patient reports visual problems and perception problems.  She has difficulty seeing her consent form.  She denies any double vision.   PHYSICAL EXAM: Sitting comfortably in hospital wheelchair General: Alert and oriented, in no acute distress, shows no confusion whatsoever.  She can summarize her oncologic history well. HEENT: Head is normocephalic. Extraocular movements are intact. Oropharynx is clear.  No secondary infection Neck: Neck is supple, no palpable cervical or supraclavicular lymphadenopathy. Heart: Regular in rate and rhythm with no murmurs, rubs, or gallops. Chest: Clear to auscultation bilaterally, with no rhonchi, wheezes, or rales.  Right mastectomy scar.  No palpable or visible signs of recurrence Abdomen: Soft, nontender, nondistended, with no rigidity or guarding. Extremities: No cyanosis or edema. Lymphatics: see Neck Exam Skin: No concerning lesions. Musculoskeletal: symmetric strength and muscle tone throughout. Neurologic: Cranial nerves II through XII are grossly intact. No obvious focalities. Speech is fluent. Coordination is intact. Psychiatric: Judgment and insight are intact. Affect is appropriate.   ECOG = 2  0 - Asymptomatic (Fully active, able to carry on all predisease activities without restriction)  1 - Symptomatic but completely ambulatory (Restricted in physically strenuous activity but ambulatory and able to carry out work of a light or sedentary nature. For example, light housework, office work)  2 - Symptomatic, <50% in bed during the day (Ambulatory and capable of all self care but unable to carry out any work activities. Up and about more than 50% of waking hours)  3 - Symptomatic, >50% in bed, but not bedbound (Capable of only limited self-care, confined to bed or chair  50% or more of waking hours)  4 - Bedbound (Completely disabled. Cannot carry on any self-care. Totally confined to bed or chair)  5 - Death   Lolita Rieger, Daniel Nones  RH, T[place] DC, et al. [phone]). "Toxicity and response criteria of the [facility]". [place] Oncol. 5 (6): 649-55  LABORATORY DATA:  Lab Results  Component Value Date   WBC 9.9 04/28/2019   HGB 11.3 (L) 04/28/2019   HCT 35.5 (L) 04/28/2019   MCV 95.7 04/28/2019   PLT 353 04/28/2019   NEUTROABS 6.7 04/28/2019   Lab Results  Component Value Date   NA 139 04/28/2019   K 3.8 04/28/2019   CL 103 04/28/2019   CO2 25 04/28/2019   GLUCOSE 91 04/28/2019   CREATININE 1.48 (H) 04/28/2019   CALCIUM 9.8 04/28/2019      RADIOGRAPHY: Mr Brain W And Wo Contrast  Result Date: 04/28/2019 CLINICAL DATA:  Focal neuro deficit, greater than 6 hours, stroke suspected. Additional history provided: History of breast cancer on chemotherapy currently 3 week history of blurred vision and trouble with depth perception causing difficulty ambulating. EXAM: MRI HEAD WITHOUT AND WITH CONTRAST TECHNIQUE: Multiplanar, multiecho pulse sequences of the brain and surrounding structures were obtained without and with intravenous contrast. CONTRAST:  46m GADAVIST GADOBUTROL 1 MMOL/ML IV SOLN COMPARISON:  PET-CT 03/11/2019, head CT 04/28/2007 FINDINGS: Brain: There is no evidence of acute infarct. There is a large irregular enhancing mass centered within the right occipital lobe measuring 3.8 x 2.4 x 4.0 cm (AP x TV x CC). Findings are consistent with a dominant intracranial metastasis. Curvilinear SWI signal loss at this site which may reflect a small amount of non acute hemorrhage or prominent vessels. Overlying dural thickening. Moderate surrounding vasogenic edema which extends anteriorly to involve portions of the callosal splenium. Associated mass effect with partial effacement of the posterior right lateral ventricle. 4 mm leftward  midline shift. Multiple additional metastases within the left occipital lobe, the largest measuring 1.6 x 1.4 cm in transaxial dimension (series 19, image 84). The second largest lesion within the left occipital lobe measures 1.3 x 1.2 cm in transaxial dimensions (series 19, image 102). Mild to moderate surrounding vasogenic edema. There are numerous additional subcentimeter enhancing metastases within the bilateral cerebral hemispheres, right basal ganglia, pons and cerebellum, many of which have mild surrounding vasogenic edema (see annotations on postcontrast axial sequence 19 for the largest lesions). No extra-axial fluid collection.  Cerebral volume is normal for age. Vascular: Flow voids maintained within the proximal large arterial vessels. Skull and upper cervical spine: A lytic right skull base lesion was better appreciated on PET-CT 03/11/2019. No definite lesion within the visualized cervical spine. Sinuses/Orbits: Visualized orbits demonstrate no acute abnormality. Minimal ethmoid sinus mucosal thickening. Large left maxillary sinus mucous retention cyst. No significant mastoid effusion. These results were called by telephone at the time of interpretation on 04/28/2019 at 2:49 pm to provider Dr. TGustavus Messing who verbally acknowledged these results. 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 Dominant metastasis within the right occipital lobe measuring 3.8 x 2.4 x 4.0 cm with moderate surrounding edema. Associated mass effect with partial effacement of the posterior right lateral ventricle and 4 mm leftward midline shift. Also of note, metastases are present within the left occipital lobe measuring up to 1.6 x 1.4 cm with mild surrounding vasogenic edema. Findings likely account for the patient's visual deficits. No evidence of acute infarct. Lytic right skull base lesion better appreciated on prior PET-CT. 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  Patient has multiple brain metastasis and therefore would not be a candidate for stereotactic radiosurgery.  She would be a good candidate for whole brain radiation therapy.  High-dose steroids have been started and the patient already feels her vision may be improving.  Today, I talked to the patient and family about the findings and work-up thus far.  We discussed the natural history of metastatic breast cancer and general treatment, highlighting the role of radiotherapy in the management.  We discussed the available radiation techniques, and focused on the details of logistics and delivery.  We reviewed the anticipated acute and late sequelae associated with radiation in this setting.  The patient was encouraged to ask questions that I answered to the best of my ability.  A patient consent form was discussed and signed.  We retained a copy for our records.  The patient would like to proceed with radiation and will be scheduled for CT simulation.  PLAN: Patient will proceed with CT simulation later this morning.  She will return tomorrow for first  treatment.  She will receive 30 Gy in 10 fractions.  Once medically stable she can receive her  radiation therapy as an outpatient.    ------------------------------------------------  Blair Promise, PhD, MD  This document serves as a record of services personally performed by Gery Pray, MD. It was created on his behalf by Clerance Lav, a trained medical scribe. The creation of this record is based on the scribe's personal observations and the provider's statements to them. This document has been checked and approved by the attending provider.

## 2019-04-29 NOTE — ED Notes (Signed)
RN contacted Nurse, adult at [place], informed her that pt was being transported at this time.  No changes at this time.

## 2019-04-29 NOTE — Progress Notes (Signed)
HEMATOLOGY-ONCOLOGY PROGRESS NOTE  SUBJECTIVE: The patient presented to the emergency room with a 3 to 4-week history of vision changes especially depth perception which affected her balance and mobility.  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  Dominant metastasis within the right occipital lobe measures 3.8 x 2.4 x 2 4.0 cm with surrounding edema.  Associated mass-effect with partial effacement of the posterior right lateral ventricle and 4 mm leftward midline shift.  Metastases are also present in the left occipital lobe measuring up to 1.6 x 1.4 cm with mild surrounding vasogenic edema. She was started on dexamethasone.  Case was reviewed by neurosurgery and there is no indication for surgical intervention.  Radiation oncology consult was recommended for consideration of SRS versus whole brain radiation.  A radiation oncology consult has been requested and is pending.  The patient reports that her vision has not really improved much.  She had a headache at the base of her head yesterday.  Headache is somewhat improved.  Denies dizziness.  She has no other complaints including fevers, chills, chest pain, shortness of breath, cough, abdominal pain, nausea, vomiting, constipation, diarrhea.  Oncology History  Malignant neoplasm of upper-outer quadrant of right breast in female, estrogen receptor negative ([place])  03/2004 Surgery   Right lumpectomy and SLNB for IDC, 0.5cm, 1/2 + SLN, grade 3, ER-, PR-, HER-2 -.  Treated with Doxorubicin and Cyclophosphamide x 4, followed by weekly Paclitaxel x 7 and adjuvant radiation.    10/01/2016 Initial Biopsy   Right breast upper outer quadrant biopsy: IDC, DCIS, triple negative, grade 3. Lymph node: Positive   11/05/2016 Surgery   Right modified radical mastectomy: IDC, grade 3, 2.6 cm, +LVI, triple negative, margins negative, 2/10 LN + metastases.  T2, N1a   11/20/2016 - 03/11/2017  Adjuvant Chemotherapy   Gemcitabine/Carboplatin given on days 1 and 8 on a 21 day cycle x 6 cycles   07/15/2018 - 07/15/2018 Chemotherapy   The patient had pembrolizumab (KEYTRUDA) 200 mg in sodium chloride 0.9 % 50 mL chemo infusion, 200 mg, Intravenous, Once, 0 of 6 cycles  for chemotherapy treatment.    07/15/2018 - 01/01/2019 Chemotherapy   The patient had atezolizumab (TECENTRIQ) 840 mg in sodium chloride 0.9 % 250 mL chemo infusion, 840 mg, Intravenous, Once, 6 of 8 cycles Dose modification: 1,680 mg (original dose 840 mg, Cycle 4, Reason: Provider Judgment), 840 mg (original dose 840 mg, Cycle 4, Reason: Provider Judgment) Administration: 840 mg (07/15/2018), 840 mg (08/01/2018), 840 mg (08/15/2018), 840 mg (08/29/2018), 840 mg (09/12/2018), 840 mg (09/26/2018), 840 mg (10/10/2018), 840 mg (10/24/2018), 1,680 mg (11/07/2018), 1,680 mg (12/05/2018)  for chemotherapy treatment.    12/19/2018 -  Chemotherapy   The patient had pegfilgrastim (NEULASTA) injection 6 mg, 6 mg, Subcutaneous, Once, 1 of 1 cycle Administration: 6 mg (03/28/2019) pegfilgrastim (NEULASTA ONPRO KIT) injection 6 mg, 6 mg, Subcutaneous, Once, 4 of 4 cycles Administration: 6 mg (01/09/2019), 6 mg (01/23/2019), 6 mg (02/06/2019), 6 mg (02/20/2019), 6 mg (04/03/2019), 6 mg (04/17/2019) eriBULin mesylate (HALAVEN) 2.75 mg in sodium chloride 0.9 % 100 mL chemo infusion, 1.2 mg/m2 = 2.75 mg (100 % of original dose 1.2 mg/m2), Intravenous,  Once, 5 of 5 cycles Dose modification: 1.2 mg/m2 (original dose 1.2 mg/m2, Cycle 1, Reason: Provider Judgment) Administration: 2.75 mg (12/19/2018), 2.75 mg (01/09/2019), 2.75 mg (01/23/2019), 2.75 mg (02/06/2019), 2.75 mg (02/20/2019), 2.75 mg (03/06/2019), 2.75 mg (03/25/2019), 2.75 mg (04/03/2019), 2.75 mg (04/17/2019)  for chemotherapy treatment.    01/02/2019 - 01/02/2019 Chemotherapy   The patient had atezolizumab (TECENTRIQ) 1,200 mg in sodium chloride 0.9 % 250 mL chemo infusion, 1,200 mg, Intravenous, Once, 0 of 6  cycles  for chemotherapy treatment.    Metastatic breast cancer ([place])  11/05/2016 Initial Diagnosis   Recurrent cancer of right breast ([place])   07/15/2018 - 01/01/2019 Chemotherapy   The patient had atezolizumab (TECENTRIQ) 840 mg in sodium chloride 0.9 % 250 mL chemo infusion, 840 mg, Intravenous, Once, 5 of 8 cycles Dose modification: 1,680 mg (original dose 840 mg, Cycle 4, Reason: Provider Judgment), 840 mg (original dose 840 mg, Cycle 4, Reason: Provider Judgment) Administration: 840 mg (07/15/2018), 840 mg (08/01/2018), 840 mg (08/15/2018), 840 mg (08/29/2018), 840 mg (09/12/2018), 840 mg (09/26/2018), 840 mg (10/10/2018), 840 mg (10/24/2018), 1,680 mg (11/07/2018)  for chemotherapy treatment.    12/19/2018 -  Chemotherapy   The patient had pegfilgrastim (NEULASTA) injection 6 mg, 6 mg, Subcutaneous, Once, 1 of 1 cycle Administration: 6 mg (03/28/2019) pegfilgrastim (NEULASTA ONPRO KIT) injection 6 mg, 6 mg, Subcutaneous, Once, 4 of 4 cycles Administration: 6 mg (01/09/2019), 6 mg (01/23/2019), 6 mg (02/06/2019), 6 mg (02/20/2019), 6 mg (04/03/2019), 6 mg (04/17/2019) eriBULin mesylate (HALAVEN) 2.75 mg in sodium chloride 0.9 % 100 mL chemo infusion, 1.2 mg/m2 = 2.75 mg (100 % of original dose 1.2 mg/m2), Intravenous,  Once, 5 of 5 cycles Dose modification: 1.2 mg/m2 (original dose 1.2 mg/m2, Cycle 1, Reason: Provider Judgment) Administration: 2.75 mg (12/19/2018), 2.75 mg (01/09/2019), 2.75 mg (01/23/2019), 2.75 mg (02/06/2019), 2.75 mg (02/20/2019), 2.75 mg (03/06/2019), 2.75 mg (03/25/2019), 2.75 mg (04/03/2019), 2.75 mg (04/17/2019)  for chemotherapy treatment.       REVIEW OF SYSTEMS:   A comprehensive review of systems was otherwise noncontributory except as noted in the HPI.  I have reviewed the past medical history, past surgical history, social history and family history with the patient and they are unchanged from previous note.   PHYSICAL EXAMINATION: ECOG PERFORMANCE STATUS: 1 - Symptomatic but  completely ambulatory  Vitals:   04/29/19 0023 04/29/19 0649  BP: (!) 144/100 128/85  Pulse: (!) 110 78  Resp: 18 18  Temp:  97.9 F (36.6 C)  SpO2: 99% 97%   There were no vitals filed for this visit.  Intake/Output from previous day: No intake/output data recorded.  GENERAL:alert, no distress and comfortable SKIN: skin color, texture, turgor are normal, no rashes or significant lesions EYES: normal, Conjunctiva are pink and non-injected, sclera clear OROPHARYNX:no exudate, no erythema and lips, buccal mucosa, and tongue normal  NECK: supple, thyroid normal size, non-tender, without nodularity LYMPH:  no palpable lymphadenopathy in the cervical, axillary or inguinal LUNGS: clear to auscultation and percussion with normal breathing effort HEART: regular rate & rhythm and no murmurs and no lower extremity edema ABDOMEN:abdomen soft, non-tender and normal bowel sounds Musculoskeletal:no cyanosis of digits and no clubbing  NEURO: alert & oriented x 3 with fluent speech, no focal motor/sensory deficits  LABORATORY DATA:  I have reviewed the data as listed CMP Latest Ref Rng & Units 04/28/2019 04/17/2019 04/03/2019  Glucose 70 - 99 mg/dL 91 100(H) 94  BUN 8 - 23 mg/dL _0 Creatinine 0.44 - 1.00 mg/dL 1.48(H) 1.35(H) 1.40(H)  Sodium 135 - 145 mmol/L 139 139 139  Potassium 3.5 - 5.1 mmol/L 3.8 3.9 3.7  Chloride 98 - 111 mmol/L 103 103 101  CO2 22 - 32 mmol/L _1 Calcium  8.9 - 10.3 mg/dL 9.8 9.8 9.7  Total Protein 6.5 - 8.1 g/dL 7.7 8.3(H) 8.2(H)  Total Bilirubin 0.3 - 1.2 mg/dL 0.4 0.3 0.2(L)  Alkaline Phos 38 - 126 U/L 65 84 124  AST 15 - 41 U/L 36 39 39  ALT 0 - 44 U/L _0 Lab Results  Component Value Date   WBC 9.9 04/28/2019   HGB 11.3 (L) 04/28/2019   HCT 35.5 (L) 04/28/2019   MCV 95.7 04/28/2019   PLT 353 04/28/2019   NEUTROABS 6.7 04/28/2019    Mr Brain W And Wo Contrast  Result Date: 04/28/2019 CLINICAL DATA:  Focal neuro deficit, greater  than 6 hours, stroke suspected. Additional history provided: History of breast cancer on chemotherapy currently 3 week history of blurred vision and trouble with depth perception causing difficulty ambulating. EXAM: MRI HEAD WITHOUT AND WITH CONTRAST TECHNIQUE: Multiplanar, multiecho pulse sequences of the brain and surrounding structures were obtained without and with intravenous contrast. CONTRAST:  31m GADAVIST GADOBUTROL 1 MMOL/ML IV SOLN COMPARISON:  PET-CT 03/11/2019, head CT 04/28/2007 FINDINGS: Brain: There is no evidence of acute infarct. There is a large irregular enhancing mass centered within the right occipital lobe measuring 3.8 x 2.4 x 4.0 cm (AP x TV x CC). Findings are consistent with a dominant intracranial metastasis. Curvilinear SWI signal loss at this site which may reflect a small amount of non acute hemorrhage or prominent vessels. Overlying dural thickening. Moderate surrounding vasogenic edema which extends anteriorly to involve portions of the callosal splenium. Associated mass effect with partial effacement of the posterior right lateral ventricle. 4 mm leftward midline shift. Multiple additional metastases within the left occipital lobe, the largest measuring 1.6 x 1.4 cm in transaxial dimension (series 19, image 84). The second largest lesion within the left occipital lobe measures 1.3 x 1.2 cm in transaxial dimensions (series 19, image 102). Mild to moderate surrounding vasogenic edema. There are numerous additional subcentimeter enhancing metastases within the bilateral cerebral hemispheres, right basal ganglia, pons and cerebellum, many of which have mild surrounding vasogenic edema (see annotations on postcontrast axial sequence 19 for the largest lesions). No extra-axial fluid collection.  Cerebral volume is normal for age. Vascular: Flow voids maintained within the proximal large arterial vessels. Skull and upper cervical spine: A lytic right skull base lesion was better  appreciated on PET-CT 03/11/2019. No definite lesion within the visualized cervical spine. Sinuses/Orbits: Visualized orbits demonstrate no acute abnormality. Minimal ethmoid sinus mucosal thickening. Large left maxillary sinus mucous retention cyst. No significant mastoid effusion. These results were called by telephone at the time of interpretation on 04/28/2019 at 2:49 pm to provider Dr. TGustavus Messing who verbally acknowledged these results. 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 Dominant metastasis within the right occipital lobe measuring 3.8 x 2.4 x 4.0 cm with moderate surrounding edema. Associated mass effect with partial effacement of the posterior right lateral ventricle and 4 mm leftward midline shift. Also of note, metastases are present within the left occipital lobe measuring up to 1.6 x 1.4 cm with mild surrounding vasogenic edema. Findings likely account for the patient's visual deficits. No evidence of acute infarct. Lytic right skull base lesion better appreciated on prior PET-CT. Electronically Signed   By: KKellie SimmeringDO   On: 04/28/2019 14:50    ASSESSMENT:63 y.o.[place] woman   (1) status post right lumpectomyand sentinel lymph node sampling October 2005 for a 0.6 cm invasive ductal carcinoma involving one out of 2  sentinel lymph nodes sampled, grade 3, triple-negative, treated adjuvantly with doxorubicin and cyclophosphamide4 followed by weekly paclitaxel7, followed by adjuvant radiation  RECURRENT DISEASE: (2) status post right breast upper outer quadrant biopsy and right axillary lymph node biopsy 10/01/2016, both positive for a T2 N1, stage IIIB invasive ductal carcinoma,triple negative, with an MIB-1 of 50-70%  (3) status post right modified radical mastectomy05/14/2018 showing a pT2 pN1, stage IIIB invasive ductal carcinoma, grade 3, triple negative, with negative margins  (4) not a candidate for radiation given  prior history  (5) adjuvant chemotherapy consisting of carboplatin and gemcitabine given days 1 and 8 of each 21 day cycle, for 6 cycles, starting 11/20/2016, completed 03/11/2017 (a) day 8 cycle 2 omitted because of neutropenia; Neupogen/Neulasta added  (5) HIV positivity: under care of Id Lucianne [place])  (6) Genetic testing 06/17/2017: no pathogenicmutations.Genes tested: APC, ATM, AXIN2, BARD1, BLM, BMPR1A, BRCA1, BRCA2, BRIP1, CDH1, CDK4, CDKN2A (p14ARF), CDKN2A (p16INK4a), CEBPA, CHEK2, CTNNA1, DICER1, EPCAM*, GATA2, GREM1*, HRAS, KIT, MEN1, MLH1, MSH2, MSH3, MSH6, MUTYH, NBN, NF1, PALB2, PDGFRA, PMS2, POLD1, POLE, PTEN, RAD50, RAD51C, RAD51D, RUNX1, SDHB, SDHC, SDHD, SMAD4, SMARCA4, STK11, TERC, TERT, TP53, TSC1, TSC2, VHL.The following genes were evaluated for sequence changes only: HOXB13*, NTHL1*, SDHA.  (a) A variant of uncertain significance (VUS)in a gene calledNTHL1was also noted.c.[id number]>A (p.[id number])  METASTATIC DISEASE: November 2019 (1) Patient seen in urgent care and ultimately ED on 10/21 for shortness of breath, chest xray demonstrated Right pleural effusion.  (a) right thoracenteses on 10/21 and 11/4 results show atypical cells, non diagnostic (b) CT chest 04/22/2018 shows re-accumulation of fluid, and right pleural nodularity. (c) PET scan on 05/01/2018 shows hypermetabolic pleural based metastases, right CP angle nodal metastases, no evidence of malignancy in abdomen and pelvis. (d) bronchoscopy with biopsy by Dr. Lamonte Sakai and BAL on 05/15/2018 was non diagnostic (e) VAT biopsy of the right pleura 12/06/2019confirms carcinoma, again triple negative (f) [facility] shows PD-L1 positive(1% in [facility]); otherwise microsatellite stable, TMB low (5/Mb), no PIK3 mutations; other mutations suggest sensitivity to MTOR inhibitors and several TKIs  (2) Atezolizumab started 07/15/2018 given every other  week (a) PET 08/13/2018 shows tumor Right hemithorax (new baseline study) (b) Atezo changed to Q4w starting with 11/07/2018 dose (c) PET 11/28/2018 documents progression in the right lung and pleural area as well as lymph nodes, and a T1 skeletal metastasis (d) atezolizumab discontinued after 12/05/2018 dose  (3) eribulin day 1 and 8 of every 21 day cycle started 12/19/2018 (a) changed to every other week with Neulsta support due to neutropenia causing treatment delays and her h/o HIV (delays due to insurance denial of onpro after 02/20/2019 dose)             (b) Zometa to be given every 4 weeks starting on 03/25/2019  (4) hospital admission 04/28/2019-presented with visual changes and found to have new brain metastases.  PLAN:  Octa has now developed brain metastases.  MRI findings were discussed with the patient.  Continue dexamethasone 4 mg every 6 hours for vasogenic edema.  Radiation oncology has been consulted and plans to bring her to the cancer center later this morning for simulation for whole brain radiation.  The patient is scheduled for next cycle of chemotherapy on 05/01/2019.  Will likely need to delay this due to her hospitalization and whole brain radiation.  Plan discussed with hospitalist.  We will continue to follow along with you.   LOS: 1 day   Mikey Bussing, DNP, AGPCNP-BC, AOCNP 04/29/19

## 2019-04-29 NOTE — ED Notes (Addendum)
Pt was woken for meds, became nauseated.  Gave PRN zofran, will continue to monitor and give PO medications once nausea is relieved.

## 2019-04-30 ENCOUNTER — Ambulatory Visit
Admit: 2019-04-30 | Discharge: 2019-04-30 | Disposition: A | Discharge status: Home / Self Care | Payer: Medicare HMO | Attending: Radiation Oncology | Admitting: Radiation Oncology

## 2019-04-30 DIAGNOSIS — C7931 Secondary malignant neoplasm of brain: Secondary | ICD-10-CM

## 2019-04-30 MED ORDER — ENSURE ENLIVE PO LIQD
237.0000 mL | Freq: Three times a day (TID) | ORAL | Status: DC
  Administered 2019-04-30 – 2019-05-01 (×4): 237 mL via ORAL

## 2019-04-30 MED ORDER — POLYETHYLENE GLYCOL 3350 17 G PO PACK
17.0000 g | PACK | Freq: Every day | ORAL | Status: DC
  Administered 2019-04-30 – 2019-05-01 (×2): 17 g via ORAL
  Filled 2019-04-30 (×2): qty 1

## 2019-04-30 MED ORDER — DOCUSATE SODIUM 100 MG PO CAPS
200.0000 mg | ORAL_CAPSULE | Freq: Every day | ORAL | Status: DC
  Administered 2019-04-30 – 2019-05-01 (×2): 200 mg via ORAL
  Filled 2019-04-30 (×2): qty 2

## 2019-04-30 NOTE — Progress Notes (Signed)
  Radiation Oncology         [phone] ________________________________  Name: Kayla Price MRN: [id number]  Date: 04/30/2019  DOB: Feb 18, 1956  Simulation Verification Note    ICD-10-CM   1. Brain metastasis ([place])  C79.31     Status: inpatient  NARRATIVE: The patient was brought to the treatment unit and placed in the planned treatment position. The clinical setup was verified. Then port films were obtained and uploaded to the radiation oncology medical record software.  The treatment beams were carefully compared against the planned radiation fields. The position location and shape of the radiation fields was reviewed. They targeted volume of tissue appears to be appropriately covered by the radiation beams. Organs at risk appear to be excluded as planned.  Based on my personal review, I approved the simulation verification. The patient's treatment will proceed as planned.  -----------------------------------  Blair Promise, PhD, MD

## 2019-04-30 NOTE — Progress Notes (Signed)
Kayla Price   DOB:Jul 02, 1955   GQ#:[id number]   OIZ#:[id number]  Subjective:  Edit noted visual changes 04/28/2019, which brought her to the emergency room.  Work-up has shown multiple brain lesions.  She was started on steroids, with improvement in her visual symptoms.  She had no motor symptoms, no headaches, no nausea or vomiting and no stiff neck.  She denies falls.  She has been evaluated by Dr. Sondra Come in radiation oncology.  She was simulated yesterday and will start whole brain radiation today   Objective: Middle-aged African-American woman examined in bed Vitals:   04/29/19 2127 04/30/19 0537  BP: 132/87 127/89  Pulse: 82 69  Resp:  18  Temp: 97.8 F (36.6 C) 98 F (36.7 C)  SpO2: 98% 97%    There is no height or weight on file to calculate BMI.  Intake/Output Summary (Last 24 hours) at 04/30/2019 0808 Last data filed at 04/29/2019 0900 Gross per 24 hour  Intake 240 ml  Output -  Net 240 ml    Normal mentation, clear speech, well oriented and appropriate  CBG (last 3)  Recent Labs    04/28/19 0952  GLUCAP 74     Labs:  Lab Results  Component Value Date   WBC 9.9 04/28/2019   HGB 11.3 (L) 04/28/2019   HCT 35.5 (L) 04/28/2019   MCV 95.7 04/28/2019   PLT 353 04/28/2019   NEUTROABS 6.7 04/28/2019    _0 @  Urine Studies No results for input(s): UHGB, CRYS in the last 72 hours.  Invalid input(s): UACOL, UAPR, USPG, UPH, UTP, UGL, UKET, UBIL, UNIT, UROB, ULEU, UEPI, UWBC, URBC, UBAC, CAST, UCOM, BILUA  Basic Metabolic Panel: Recent Labs  Lab 04/28/19 1054  NA 139  K 3.8  CL 103  CO2 25  GLUCOSE 91  BUN 18  CREATININE 1.48*  CALCIUM 9.8   GFR Estimated Creatinine Clearance: 46.7 mL/min (A) (by C-G formula based on SCr of 1.48 mg/dL (H)). Liver Function Tests: Recent Labs  Lab 04/28/19 1054  AST 36  ALT 14  ALKPHOS 65  BILITOT 0.4  PROT 7.7  ALBUMIN 3.5   No results for input(s): LIPASE, AMYLASE in the last 168 hours. No  results for input(s): AMMONIA in the last 168 hours. Coagulation profile No results for input(s): INR, PROTIME in the last 168 hours.  CBC: Recent Labs  Lab 04/28/19 1054  WBC 9.9  NEUTROABS 6.7  HGB 11.3*  HCT 35.5*  MCV 95.7  PLT 353   Cardiac Enzymes: No results for input(s): CKTOTAL, CKMB, CKMBINDEX, TROPONINI in the last 168 hours. BNP: Invalid input(s): POCBNP CBG: Recent Labs  Lab 04/28/19 0952  GLUCAP 74   D-Dimer No results for input(s): DDIMER in the last 72 hours. Hgb A1c No results for input(s): HGBA1C in the last 72 hours. Lipid Profile No results for input(s): CHOL, HDL, LDLCALC, TRIG, CHOLHDL, LDLDIRECT in the last 72 hours. Thyroid function studies No results for input(s): TSH, T4TOTAL, T3FREE, THYROIDAB in the last 72 hours.  Invalid input(s): FREET3 Anemia work up No results for input(s): VITAMINB12, FOLATE, FERRITIN, TIBC, IRON, RETICCTPCT in the last 72 hours. Microbiology Recent Results (from the past 240 hour(s))  SARS CORONAVIRUS 2 (TAT 6-24 HRS) Nasopharyngeal Nasopharyngeal Swab     Status: None   Collection Time: 04/28/19  3:20 PM   Specimen: Nasopharyngeal Swab  Result Value Ref Range Status   SARS Coronavirus 2 NEGATIVE NEGATIVE Final    Comment: (NOTE) SARS-CoV-2 target nucleic acids are NOT  DETECTED. The SARS-CoV-2 RNA is generally detectable in upper and lower respiratory specimens during the acute phase of infection. Negative results do not preclude SARS-CoV-2 infection, do not rule out co-infections with other pathogens, and should not be used as the sole basis for treatment or other patient management decisions. Negative results must be combined with clinical observations, patient history, and epidemiological information. The expected result is Negative. Fact Sheet for Patients: SugarRoll.be Fact Sheet for Healthcare Providers: https://www.woods-mathews.com/ This test is not yet  approved or cleared by the [place] FDA and  has been authorized for detection and/or diagnosis of SARS-CoV-2 by FDA under an Emergency Use Authorization (EUA). This EUA will remain  in effect (meaning this test can be used) for the duration of the COVID-19 declaration under Section 56 4(b)(1) of the Act, 21 U.S.C. section 360bbb-3(b)(1), unless the authorization is terminated or revoked sooner. Performed at [facility], [place] [address], [place], [place] [address]       Studies:  Mr Kayla Price And Wo Contrast  Result Date: 04/28/2019 CLINICAL DATA:  Focal neuro deficit, greater than 6 hours, stroke suspected. Additional history provided: History of breast cancer on chemotherapy currently 3 week history of blurred vision and trouble with depth perception causing difficulty ambulating. EXAM: MRI HEAD WITHOUT AND WITH CONTRAST TECHNIQUE: Multiplanar, multiecho pulse sequences of the brain and surrounding structures were obtained without and with intravenous contrast. CONTRAST:  36m GADAVIST GADOBUTROL 1 MMOL/ML IV SOLN COMPARISON:  PET-CT 03/11/2019, head CT 04/28/2007 FINDINGS: Brain: There is no evidence of acute infarct. There is a large irregular enhancing mass centered within the right occipital lobe measuring 3.8 x 2.4 x 4.0 cm (AP x TV x CC). Findings are consistent with a dominant intracranial metastasis. Curvilinear SWI signal loss at this site which may reflect a small amount of non acute hemorrhage or prominent vessels. Overlying dural thickening. Moderate surrounding vasogenic edema which extends anteriorly to involve portions of the callosal splenium. Associated mass effect with partial effacement of the posterior right lateral ventricle. 4 mm leftward midline shift. Multiple additional metastases within the left occipital lobe, the largest measuring 1.6 x 1.4 cm in transaxial dimension (series 19, image 84). The second largest lesion within the left occipital lobe measures 1.3 x  1.2 cm in transaxial dimensions (series 19, image 102). Mild to moderate surrounding vasogenic edema. There are numerous additional subcentimeter enhancing metastases within the bilateral cerebral hemispheres, right basal ganglia, pons and cerebellum, many of which have mild surrounding vasogenic edema (see annotations on postcontrast axial sequence 19 for the largest lesions). No extra-axial fluid collection.  Cerebral volume is normal for age. Vascular: Flow voids maintained within the proximal large arterial vessels. Skull and upper cervical spine: A lytic right skull base lesion was better appreciated on PET-CT 03/11/2019. No definite lesion within the visualized cervical spine. Sinuses/Orbits: Visualized orbits demonstrate no acute abnormality. Minimal ethmoid sinus mucosal thickening. Large left maxillary sinus mucous retention cyst. No significant mastoid effusion. These results were called by telephone at the time of interpretation on 04/28/2019 at 2:49 pm to provider Dr. TGustavus Messing who verbally acknowledged these results. 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 Dominant metastasis within the right occipital lobe measuring 3.8 x 2.4 x 4.0 cm with moderate surrounding edema. Associated mass effect with partial effacement of the posterior right lateral ventricle and 4 mm leftward midline shift. Also of note, metastases are present within the left occipital lobe measuring up to 1.6 x 1.4 cm with  mild surrounding vasogenic edema. Findings likely account for the patient's visual deficits. No evidence of acute infarct. Lytic right skull base lesion better appreciated on prior PET-CT. Electronically Signed   By: Kellie Simmering DO   On: 04/28/2019 14:50    Assessment: 63 y.o. Kayla Price woman   (1) status post right lumpectomy and sentinel lymph node sampling October 2005 for a 0.6 cm invasive ductal carcinoma involving one out of 2 sentinel lymph nodes sampled,  grade 3, triple-negative, treated adjuvantly with doxorubicin and cyclophosphamide 4 followed by weekly paclitaxel 7, followed by adjuvant radiation  RECURRENT DISEASE: (2) status post right breast upper outer quadrant biopsy and right axillary lymph node biopsy 10/01/2016, both positive for a T2 N1, stage IIIB invasive ductal carcinoma, triple negative, with an MIB-1 of 50-70%  (3) status post right modified radical mastectomy 11/05/2016 showing a pT2 pN1, stage IIIB invasive ductal carcinoma, grade 3, triple negative, with negative margins  (4) not a candidate for radiation given prior history  (5) adjuvant chemotherapy consisting of carboplatin and gemcitabine given days 1 and 8 of each 21 day cycle, for 6 cycles, starting 11/20/2016, completed 03/11/2017             (a) day 8 cycle 2 omitted because of neutropenia; Neupogen/Neulasta added  (5) HIV positivity: under care of Id Lucianne [place])  (6) Genetic testing 06/17/2017: no pathogenicmutations.Genes tested: APC, ATM, AXIN2, BARD1, BLM, BMPR1A, BRCA1, BRCA2, BRIP1, CDH1, CDK4, CDKN2A (p14ARF), CDKN2A (p16INK4a), CEBPA, CHEK2, CTNNA1, DICER1, EPCAM*, GATA2, GREM1*, HRAS, KIT, MEN1, MLH1, MSH2, MSH3, MSH6, MUTYH, NBN, NF1, PALB2, PDGFRA, PMS2, POLD1, POLE, PTEN, RAD50, RAD51C, RAD51D, RUNX1, SDHB, SDHC, SDHD, SMAD4, SMARCA4, STK11, TERC, TERT, TP53, TSC1, TSC2, VHL.The following genes were evaluated for sequence changes only: HOXB13*, NTHL1*, SDHA.              (a) A variant of uncertain significance (VUS)in a gene calledNTHL1was also noted.c.736G>A (p.Ala246Thr)  METASTATIC DISEASE: November 2019 (1) Patient seen in urgent care and ultimately ED on 10/21 for shortness of breath, chest xray demonstrated Right pleural effusion.   (a) right thoracenteses on 10/21 and 11/4 results show atypical cells, non diagnostic (b) CT chest 04/22/2018 shows re-accumulation of fluid, and right pleural nodularity. (c) PET scan on 05/01/2018 shows  hypermetabolic pleural based metastases, right CP angle nodal metastases, no evidence of malignancy in abdomen and pelvis. (d) bronchoscopy with biopsy by Dr. Lamonte Sakai and BAL on 05/15/2018 was non diagnostic             (e) VAT biopsy of the right pleura 05/30/2018           confirms carcinoma, again triple negative             (f) [facility] shows PD-L1 positive (1% in [facility]); otherwise microsatellite stable, TMB low (5/Mb), no PIK3 mutations; other mutations suggest sensitivity to MTOR inhibitors and several TKIs  (2) Atezolizumab started 07/15/2018 given every other week             (a) PET 08/13/2018 shows tumor Right hemithorax (new baseline study)             (b) Atezo changed to Q4w starting with 11/07/2018 dose             (c) PET 11/28/2018 documents progression in the right lung and pleural area as well as lymph nodes, and a T1 skeletal metastasis             (d) atezolizumab discontinued after 12/05/2018 dose  (  3) eribulin day 1 and 8 of every 21 day cycle started 12/19/2018  (a) most recent dose 04/17/2019  (b) restaging PET scan on 03/11/2019 showed stable to improved measurable disease, with multiple new bone lesions  (c) zoledronate started 03/25/2019  (4) brain MRI 04/28/2019 documents multiple intracranial metastases  (a) whole brain radiation to start 04/30/2019   Plan:  Kayla Price show some clinical improvement on steroids and hopefully her whole brain irradiation will control her intracranial disease for a period of time.  Her bony pain from metastatic disease is improved on steroids and hydromorphone and these should be continued at discharge.  She is moderately constipated and I am adding a simple bowel prophylaxis regimen with MiraLAX and stool softeners daily.  Kayla Price is scheduled for chemo 05/01/2019 but this is being canceled and I will reschedule her for a visit after the completion of her radiation treatments.  At that time we will make a decision regarding how to  proceed with her peripheral disease.  Very likely we will continue on eribulin and zoledronate as before since she was tolerating this well.  The patient anticipates significant transportation issues.  I would ask your social worker to become involved in facilitating the patient's transportation from home to radiation oncology on a daily basis while she receives her 10 treatments  Greatly appreciate your help to this patient.  Please let me know if you have any further questions or concerns that we can help with.   Chauncey Cruel, MD 04/30/2019  8:08 AM Medical Oncology and Hematology [facility] [address] [place], [place] [address] Tel. [phone]    Fax. [phone]

## 2019-04-30 NOTE — Progress Notes (Signed)
Initial Nutrition Assessment  DOCUMENTATION CODES:   Obesity unspecified  INTERVENTION:   -Ensure Enlive po TID, each supplement provides 350 kcal and 20 grams of protein  NUTRITION DIAGNOSIS:   Increased nutrient needs related to cancer and cancer related treatments as evidenced by estimated needs.  GOAL:   Patient will meet greater than or equal to 90% of their needs  MONITOR:   PO intake, Supplement acceptance, Labs, Weight trends, I & O's  REASON FOR ASSESSMENT:   Malnutrition Screening Tool    ASSESSMENT:   63 y.o. 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  **[facility] [place], pt reports she will be going to radiation today at 12:15. Pt reports she has been trying to eat throughout the day at home, states she may eat ~2 meals a day. She does not eat a lot of meats so she supplements with Equate shakes but right before she came to the hospital she ran out of them. RD to order Ensure supplements for patient TID.  Denies any other nutrition impact symptoms from chemotherapy and radiation.   Per documentation, pt consumed 75% of breakfast yesterday. States she missed the deadline to order a hot dinner last night. States she won't eat lunch today d/t radiation. Ate some of her omelet this morning but it wasn't as warm as she would like. Recommended pt try and order all her meals in the morning time so she won't miss the ordering cut off time.  Per weight records, pt has lost 59 lbs since 05/12/18 (21% wt loss x 1 year, significant for time  frame).  Pt is at risk of malnutrition, if not already malnourished.   I/Os: +240 ml since admit  Medications reviewed. Labs reviewed:  NUTRITION - FOCUSED PHYSICAL EXAM: Working remotely.  Diet Order:   Diet Order            Diet regular Room service appropriate? Yes; Fluid consistency: Thin  Diet effective now              EDUCATION NEEDS:   No education needs have been identified at this time  Skin:  Skin Assessment: Reviewed RN Assessment  Last BM:  11/1  Height:   Ht Readings from Last 1 Encounters:  04/17/19 5\' 7"  (1.702 m)    Weight:   Wt Readings from Last 1 Encounters:  04/20/19 97.5 kg    Ideal Body Weight:  61.3 kg  BMI:  33.5 kg/m^2  Estimated Nutritional Needs:   Kcal:  2100-2300  Protein:  90-100g  Fluid:  2L/day  Clayton Bibles, MS, RD, LDN Inpatient Clinical Dietitian Pager: [phone] After Hours Pager: [phone]

## 2019-04-30 NOTE — TOC Initial Note (Signed)
Transition of Care [facility]) - Initial/Assessment Note    Patient Details  Name: Kayla Price MRN: [id number] Date of Birth: 1956-01-05  [facility]) CM/SW Contact:    Alixander Rallis, Meriam Sprague, RN Phone Number:[phone] 04/30/2019, 12:32 PM  Clinical Narrative:                  CM consult for transportation to radiation appointments. This CM contacted the transportation department at [facility]. [facility] will contact pt and set up transportation for radiation.         Activities of Daily Living Home Assistive Devices/Equipment: Other (Comment) ADL Screening (condition at time of admission) Patient's cognitive ability adequate to safely complete daily activities?: Yes Is the patient deaf or have difficulty hearing?: No Does the patient have difficulty seeing, even when wearing glasses/contacts?: No Does the patient have difficulty concentrating, remembering, or making decisions?: Yes Patient able to express need for assistance with ADLs?: Yes Does the patient have difficulty dressing or bathing?: Yes Independently performs ADLs?: No Does the patient have difficulty walking or climbing stairs?: Yes Weakness of Legs: Both Weakness of Arms/Hands: None    Admission diagnosis:  Visual field defect [H53.40] Metastatic cancer to brain [facility]) [C79.31] Patient Active Problem List   Diagnosis Date Noted  . Brain metastasis (HCC) 04/28/2019  . Hypothyroidism 04/28/2019  . Anemia of chronic disease 04/28/2019  . Encounter for antineoplastic chemotherapy 02/06/2019  . Port-A-Cath in place 08/01/2018  . Goals of care, counseling/discussion 06/26/2018  . Recurrent right pleural effusion 05/30/2018  . Malignant pleural effusion 05/19/2018  . Hilar adenopathy 05/15/2018  . Pleural effusion on right 04/25/2018  . Seasonal allergic rhinitis due to pollen 01/16/2018  . Aortic atherosclerosis (HCC) 11/11/2017  . Morbid obesity with BMI of 40.0-44.9, adult (HCC) 11/11/2017  . Genetic testing  06/27/2017  . Family history of non-Hodgkin's lymphoma   . Family history of lung cancer   . Endometrial thickening on ultrasound 01/25/2017  . Metastatic breast cancer (HCC) 11/05/2016  . Malignant neoplasm of upper-outer quadrant of right breast in female, estrogen receptor negative (HCC) 10/09/2016  . Primary localized osteoarthritis of left knee 02/06/2016  . Primary osteoarthritis of left knee 02/05/2016  . Osteoarthritis of left knee 06/08/2015  . CKD (chronic kidney disease), stage III 06/08/2015  . Vitamin D deficiency 09/23/2014  . Acute renal insufficiency 04/23/2014  . 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 Bell's palsy 12/04/2012  . Contact dermatitis 08/29/2011  . Dry eyes 08/20/2011  . Dry mouth 08/20/2011  . Neuropathy 04/09/2011  . Insomnia 04/09/2011  . Obesities, morbid (HCC) 09/27/2010  . Endometrial polyp 12/22/2009  . ALOPECIA AREATA 11/28/2009  . MENORRHAGIA, POSTMENOPAUSAL 02/03/2009  . ABNORMAL GLANDULAR PAPANICOLAOU SMEAR OF CERVIX 11/04/2008  . ALOPECIA 05/06/2008  . Trigger finger, acquired 05/06/2008  . HIP FRACTURE, RIGHT 05/06/2008  . ARTHROSCOPY, LEFT KNEE, HX OF 02/03/2008  . HYPERLIPIDEMIA, MIXED 12/15/2007  . HAND PAIN, RIGHT 12/15/2007  . Other abnormal glucose 12/15/2007  . UNSPECIFIED VITAMIN D DEFICIENCY 08/06/2007  . TINEA CAPITIS 08/04/2007  . CNTC DERMATITIS&OTH ECZEMA DUE OTH CHEM PRODUCTS 08/04/2007  . PVD 04/07/2007  . Secondary localized osteoarthrosis 04/07/2007  . Human immunodeficiency virus (HIV) disease (HCC) 03/27/2006  . HTN (hypertension) 03/27/2006  . BREAST CANCER, HX OF 03/27/2006   PCP:  Sharon Seller, NP Pharmacy:   [phone] - [place], [place] - [address] AT [facility] [address]  [place] [address] Phone: [phone] Fax: [phone]  [facility] - [place], [place] - [address] [place] [address] [place]  [address] Phone: [phone] Fax: [phone]     Social Determinants of Health (SDOH) Interventions    Readmission Risk Interventions No flowsheet data found.

## 2019-04-30 NOTE — Progress Notes (Signed)
PROGRESS NOTE    Kayla Price  UMP:[id number]  DOB: 1956/03/05  DOA: 04/28/2019 PCP: Lauree Chandler, NP  Brief Narrative: 63 y.o. 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 She states it takes a minute to focus on objects to see them clearly. 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  Dominant metastasis within the right occipital lobe measures 3.8 x 2.4 x 2 4.0 cm with surrounding edema, associated mass-effect with partial effacement of the posterior right lateral ventricle and 4 mm leftward midline shift.  Metastases are also present in the left occipital lobe measuring up to 1.6 x 1.4 cm with mild surrounding vasogenic edema. She was started on dexamethasone.  Case was reviewed by neurosurgery and recommended Radiation oncology consult for [facility] versus whole brain radiation;no indication for surgical intervention. Patient transferred to [place] for radiation treatments.  Subjective:  Tolerating radiation treatments well and visual symptoms improving.  Objective: Vitals:   04/29/19 0023 04/29/19 0649 04/29/19 2127 04/30/19 0537  BP: (!) 144/100 128/85 132/87 127/89  Pulse: (!) 110 78 82 69  Resp: 18 18  18   Temp:  97.9 F (36.6 C) 97.8 F (36.6 C) 98 F (36.7 C)  TempSrc:  Oral Oral Oral  SpO2: 99% 97% 98% 97%    Intake/Output Summary (Last 24 hours) at 04/30/2019 1016 Last data filed at 04/30/2019 0840 Gross per 24 hour  Intake -  Output 500 ml  Net -500 ml   There were no vitals filed for this visit.  Physical Examination:   General exam: Appears calm and comfortable  HEENT: Atraumatic normocephalic, reports visual changes but extraocular movements intact, PERRLA, no ptosis Respiratory system: Clear to auscultation although decreased breath sounds to right base. Respiratory effort normal. Cardiovascular system: S1 & S2 heard, RRR. No JVD, murmurs, rubs, gallops or clicks. No pedal edema. Gastrointestinal system: Abdomen is nondistended, soft and nontender. No organomegaly or masses felt. Normal bowel sounds heard. Central nervous system: Alert and oriented. No focal neurological deficits. Extremities: Symmetric 5 x 5 power. Skin: No rashes, lesions or ulcers Psychiatry: Judgement and insight appear normal. Mood & affect appropriate.   Data Reviewed: I have personally reviewed following labs and imaging studies  CBC: Recent Labs  Lab 04/28/19 1054  WBC 9.9  NEUTROABS 6.7  HGB 11.3*  HCT 35.5*  MCV 95.7  PLT 400   Basic Metabolic Panel: Recent Labs  Lab 04/28/19 1054  NA 139  K 3.8  CL 103  CO2 25  GLUCOSE 91  BUN 18  CREATININE 1.48*  CALCIUM 9.8   GFR: Estimated Creatinine Clearance: 46.7 mL/min (A) (by C-G formula based on SCr of 1.48 mg/dL (H)). Liver Function Tests: Recent Labs  Lab 04/28/19 1054  AST 36  ALT 14  ALKPHOS 65  BILITOT 0.4  PROT 7.7  ALBUMIN 3.5   No results for input(s): LIPASE, AMYLASE in the last 168 hours. No results for input(s): AMMONIA in the last 168 hours. Coagulation Profile: No results for input(s): INR, PROTIME in the last 168 hours. Cardiac Enzymes: No results for input(s): CKTOTAL, CKMB, CKMBINDEX, TROPONINI in the last 168 hours. BNP (last 3 results) No  results for input(s): PROBNP in the last 8760 hours. HbA1C: No results for input(s): HGBA1C in the last 72 hours. CBG: Recent Labs  Lab 04/28/19 0952  GLUCAP 74   Lipid Profile: No results for input(s): CHOL, HDL, LDLCALC, TRIG, CHOLHDL, LDLDIRECT in the last 72 hours. Thyroid Function  Tests: No results for input(s): TSH, T4TOTAL, FREET4, T3FREE, THYROIDAB in the last 72 hours. Anemia Panel: No results for input(s): VITAMINB12, FOLATE, FERRITIN, TIBC, IRON, RETICCTPCT in the last 72 hours. Sepsis Labs: No results for input(s): PROCALCITON, LATICACIDVEN in the last 168 hours.  Recent Results (from the past 240 hour(s))  SARS CORONAVIRUS 2 (TAT 6-24 HRS) Nasopharyngeal Nasopharyngeal Swab     Status: None   Collection Time: 04/28/19  3:20 PM   Specimen: Nasopharyngeal Swab  Result Value Ref Range Status   SARS Coronavirus 2 NEGATIVE NEGATIVE Final    Comment: (NOTE) SARS-CoV-2 target nucleic acids are NOT DETECTED. The SARS-CoV-2 RNA is generally detectable in upper and lower respiratory specimens during the acute phase of infection. Negative results do not preclude SARS-CoV-2 infection, do not rule out co-infections with other pathogens, and should not be used as the sole basis for treatment or other patient management decisions. Negative results must be combined with clinical observations, patient history, and epidemiological information. The expected result is Negative. Fact Sheet for Patients: SugarRoll.be Fact Sheet for Healthcare Providers: https://www.woods-mathews.com/ This test is not yet approved or cleared by the [place] FDA and  has been authorized for detection and/or diagnosis of SARS-CoV-2 by FDA under an Emergency Use Authorization (EUA). This EUA will remain  in effect (meaning this test can be used) for the duration of the COVID-19 declaration under Section 56 4(b)(1) of the Act, 21 U.S.C. section 360bbb-3(b)(1), unless the authorization is terminated or revoked sooner. Performed at [facility], [place] [address], [place], [place] [address]       Radiology Studies: Mr Jeri Cos And Wo Contrast  Result Date: 04/28/2019 CLINICAL DATA:  Focal neuro deficit, greater than 6 hours, stroke  suspected. Additional history provided: History of breast cancer on chemotherapy currently 3 week history of blurred vision and trouble with depth perception causing difficulty ambulating. EXAM: MRI HEAD WITHOUT AND WITH CONTRAST TECHNIQUE: Multiplanar, multiecho pulse sequences of the brain and surrounding structures were obtained without and with intravenous contrast. CONTRAST:  36mL GADAVIST GADOBUTROL 1 MMOL/ML IV SOLN COMPARISON:  PET-CT 03/11/2019, head CT 04/28/2007 FINDINGS: Brain: There is no evidence of acute infarct. There is a large irregular enhancing mass centered within the right occipital lobe measuring 3.8 x 2.4 x 4.0 cm (AP x TV x CC). Findings are consistent with a dominant intracranial metastasis. Curvilinear SWI signal loss at this site which may reflect a small amount of non acute hemorrhage or prominent vessels. Overlying dural thickening. Moderate surrounding vasogenic edema which extends anteriorly to involve portions of the callosal splenium. Associated mass effect with partial effacement of the posterior right lateral ventricle. 4 mm leftward midline shift. Multiple additional metastases within the left occipital lobe, the largest measuring 1.6 x 1.4 cm in transaxial dimension (series 19, image 84). The second largest lesion within the left occipital lobe measures 1.3 x 1.2 cm in transaxial dimensions (series 19, image 102). Mild to moderate surrounding vasogenic edema. There are numerous additional subcentimeter enhancing metastases within the bilateral cerebral hemispheres, right basal ganglia, pons and cerebellum, many of which have mild surrounding vasogenic edema (see annotations on postcontrast axial sequence 19 for the largest lesions).  No extra-axial fluid collection.  Cerebral volume is normal for age. Vascular: Flow voids maintained within the proximal large arterial vessels. Skull and upper cervical spine: A lytic right skull base lesion was better appreciated on PET-CT  03/11/2019. No definite lesion within the visualized cervical spine. Sinuses/Orbits: Visualized orbits demonstrate no acute abnormality. Minimal ethmoid sinus mucosal thickening. Large left maxillary sinus mucous retention cyst. No significant mastoid effusion. These results were called by telephone at the time of interpretation on 04/28/2019 at 2:49 pm to provider Dr. Gustavus Messing, who verbally acknowledged these results. 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 Dominant metastasis within the right occipital lobe measuring 3.8 x 2.4 x 4.0 cm with moderate surrounding edema. Associated mass effect with partial effacement of the posterior right lateral ventricle and 4 mm leftward midline shift. Also of note, metastases are present within the left occipital lobe measuring up to 1.6 x 1.4 cm with mild surrounding vasogenic edema. Findings likely account for the patient's visual deficits. No evidence of acute infarct. Lytic right skull base lesion better appreciated on prior PET-CT. Electronically Signed   By: Kellie Simmering DO   On: 04/28/2019 14:50        Scheduled Meds: . Chlorhexidine Gluconate Cloth  6 each Topical Daily  . darunavir-cobicistat  1 tablet Oral QHS  . dexamethasone (DECADRON) injection  4 mg Intravenous Q6H  . docusate sodium  200 mg Oral Daily  . dolutegravir  50 mg Oral QHS   And  . rilpivirine  25 mg Oral QHS  . enoxaparin (LOVENOX) injection  40 mg Subcutaneous Q24H  . irbesartan  300 mg Oral Daily   And  . hydrochlorothiazide  25 mg Oral Daily  . levothyroxine  200 mcg Oral Q0600  . maraviroc  150 mg Oral BID  . polyethylene glycol  17 g Oral Daily   Continuous Infusions:  Assessment & Plan:    1.  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  Planned for inpatient radiation treatment  sessions--patient states she was told she will need at least 10 treatments and will stay as inpatient through the weekend. Appreciate Oncology evaluation and recommendations. Continue Decadron.  Mental status okay and oriented x3.  Denies any headaches and no focal deficits. No evidence of seizures and not on any prophylactic antiepileptics currently. SW consulted to facilitate transportation to radiation treatments as outpatient.  2.  Right breast cancer: Diagnosed in 2005, s/p lumpectomy with recurrence in 2018 requiring right mastectomy and also complicated by right lung/pleural mets, recurrent pleural effusions requiring thoracentesis/pleurodesis. On chemotherapy as outpatient and next treatment was supposed to be on 11/6 but will be postponed per oncology.  3.  HIV: Last CD4 count in record from 2012 per my review at 718.  Resumed on home medications.  Follows Dr. Tommy Medal  4.  Hypertension: On irbesartan, HCTZ.   5.  CKD stage YQM:VHQIONGEXB appears stable and at baseline around 1.3-1.4 in the setting of diuretic/ACEI. Labs in am  6.  Hypothyroidism: Continue Synthroid  7.  Anemia of chronic disease: Hemoglobin stable around 11.5  DVT prophylaxis: Lovenox Code Status: Partial (okay for IPPV/BiPAP and pressors/ACLS medications but does not want intubation/mechanical ventilation/CPR/defibrillation) Family / Patient Communication: Discussed with patient Disposition Plan: Home when medically cleared by oncology/radiation oncology     LOS: 2 days    Time spent: 25 minutes    Guilford Shi, MD [facility] Pager [phone]  If 7PM-7AM, please contact night-coverage [email or web address] Password [email or web address] 04/30/2019, 10:16 AM

## 2019-05-01 ENCOUNTER — Ambulatory Visit: Payer: Medicare HMO | Admitting: Physician Assistant

## 2019-05-01 ENCOUNTER — Other Ambulatory Visit: Payer: Medicare HMO

## 2019-05-01 ENCOUNTER — Encounter: Payer: Self-pay | Admitting: *Deleted

## 2019-05-01 ENCOUNTER — Ambulatory Visit: Payer: Medicare HMO

## 2019-05-01 ENCOUNTER — Other Ambulatory Visit: Payer: Self-pay | Admitting: Oncology

## 2019-05-01 ENCOUNTER — Ambulatory Visit
Admit: 2019-05-01 | Discharge: 2019-05-01 | Disposition: A | Discharge status: Home / Self Care | Payer: Medicare HMO | Attending: Radiation Oncology | Admitting: Radiation Oncology

## 2019-05-01 DIAGNOSIS — M1990 Unspecified osteoarthritis, unspecified site: Secondary | ICD-10-CM

## 2019-05-01 MED ORDER — DEXAMETHASONE 4 MG PO TABS: 4.0000 mg | ORAL_TABLET | Freq: Four times a day (QID) | ORAL | 0 refills | Status: AC

## 2019-05-01 MED ORDER — DOCUSATE SODIUM 100 MG PO CAPS: 200.0000 mg | ORAL_CAPSULE | Freq: Every day | ORAL | 0 refills | Status: DC

## 2019-05-01 MED ORDER — HEPARIN SOD (PORK) LOCK FLUSH 100 UNIT/ML IV SOLN
500.0000 [IU] | Freq: Once | INTRAVENOUS | Status: AC
  Administered 2019-05-01: 500 [IU] via INTRAVENOUS
  Filled 2019-05-01: qty 5

## 2019-05-01 MED ORDER — ENSURE ENLIVE PO LIQD: 237.0000 mL | Freq: Three times a day (TID) | ORAL | 12 refills | Status: DC

## 2019-05-01 MED FILL — OLMESARTAN-HCTZ 40-25 MG TA: 40-25 | 30 days supply | Qty: 30 | Fill #0

## 2019-05-01 MED FILL — SELZENTRY 150 MG TABS: 150 | 30 days supply | Qty: 60 | Fill #4

## 2019-05-01 MED FILL — HYDROCODON-APAP 5-325: 5-325 | 30 days supply | Qty: 120 | Fill #0

## 2019-05-01 NOTE — Discharge Summary (Addendum)
Physician Discharge Summary  Kayla Price:[id number] DOB: July 12, 1955 DOA: 04/28/2019  PCP: Lauree Chandler, NP  Admit date: 04/28/2019 Discharge date: 05/01/2019 Consultations: Oncology Dr. Jana Hakim, radiation oncology Dr. Sondra Come Admitted From: Home Disposition: Home  Discharge Diagnoses:  Principal Problem:   Brain metastasis ([place]) Active Problems:   Human immunodeficiency virus (HIV) disease ([place])   HTN (hypertension)   CKD (chronic kidney disease), stage III   Metastatic breast cancer ([place])   Hypothyroidism   Anemia of chronic disease   Hospital Course Summary: 63 y.o.femalewith medical history significant ofmetastatic right breast cancer (right lung/pleura/ lymph nodes and T1 skeletal metastasis)-currently on chemotherapy, HIV, hypertension, hyperlipidemia, chronic kidney disease, anemia of chronic disease presents to emergency department due to visual changes- reports that since 3 to 4 weeks she has noticed problem with her vision especially with depth perception which affects her balance and mobility. She states it takes a minute to focus on objects to see them clearly. 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 Dominant metastasis within the right occipital lobe measures 3.8 x 2.4 x 2 4.0 cm with surrounding edema, associated mass-effect with partial effacement of the posterior right lateral ventricle and 4 mm leftward midline shift. Metastases are also present in the left occipital lobe measuring up to 1.6 x 1.4 cm with mild surrounding vasogenic edema. She was started on IV dexamethasone. Case was reviewed by neurosurgery and recommended Radiation oncology consult for [facility] versus whole brain radiation;no indication for surgical intervention. Patient transferred to [place] for radiation treatments.  1.  Visual deficits due to brain metastasis: MRI as mentioned above showed several supratentorial/infratentorial brain  masses with surrounding vasogenic edema and midline shift.  Patient evaluated by radiation oncology Dr. Sondra Come and underwent 3 inpatient radiation treatment sessions--patient states she was told she will need at least 10 treatments.  Her condition did improve and radiation oncology recommended follow-up as outpatient on Monday for resuming radiation treatments.  She is cleared for discharge home on Decadron 4 mg 4 times daily (which will be tapered by Dr. Sondra Come as outpatient) as long as she has transportation available for Monday (patient states her sister and other family members can provide transportation).  She is advised to avoid driving until cleared by PCP/oncology.  At the time of discharge she is stable, mentating well and oriented x3.  Denies any headaches and no focal deficits. No evidence of seizures and not on any prophylactic antiepileptics currently.SW consulted to facilitate discharge process, transportation to radiation treatments as outpatient.  2.  Right breast cancer: Diagnosed in 2005, s/p lumpectomy with recurrence in 2018 requiring right mastectomy and also complicated by right lung/pleural mets, recurrent pleural effusions requiring thoracentesis/pleurodesis. On chemotherapy as outpatient and next treatment was supposed to be on 11/6 but will be postponed per oncology.  3.  HIV: Last CD4 count in record from 2012 per my review at 718.  Resumed on home medications.  Follows Dr. Tommy Medal  4.  Hypertension: On irbesartan, HCTZ.   5.  CKD stage QQV:ZDGLOVFIEP appears stable and at baseline around 1.3-1.4 in the setting of diuretic/ACEI.   6.  Hypothyroidism: Continue Synthroid  7.  Anemia of chronic disease: Hemoglobin stable around 11.5.  No signs of bleeding.  Follow-up as outpatient   Discharge Exam:  Vitals:   04/30/19 2157 05/01/19 0557  BP: 130/85 130/88  Pulse: 71 69  Resp: 14 18  Temp: 98 F (36.7 C) 98 F (36.7 C)  SpO2: 98%  98%   Vitals:   04/30/19 1441  04/30/19 2157 05/01/19 0557 05/01/19 0858  BP: (!) 141/87 130/85 130/88   Pulse: 71 71 69   Resp: 18 14 18    Temp: 98 F (36.7 C) 98 F (36.7 C) 98 F (36.7 C)   TempSrc: Oral Oral Oral   SpO2: 96% 98% 98%   Height:    5\' 7"  (1.702 m)    General: Pt is alert, awake, not in acute distress Cardiovascular: RRR, S1/S2 +, no rubs, no gallops Respiratory: CTA bilaterally, no wheezing, no rhonchi Abdominal: Soft, NT, ND, bowel sounds + Extremities: no edema, no cyanosis  Discharge Condition:Stable CODE STATUS:Partial (okay for IPPV/BiPAP and pressors/ACLS medications but does not want intubation/mechanical ventilation/CPR/defibrillation) Diet recommendation: Recommendations for Outpatient Follow-up:  1. Follow up with PCP: In 5 days 2. Follow up with consultants: Radiation oncology on Monday for radiation treatment as scheduled, oncology clinic with Dr. Jana Hakim next week, HIV clinic as scheduled 3. Please obtain follow up labs including: CBC/BMP on PCP follow-up  Home Health services upon discharge:  Equipment/Devices upon discharge:   Discharge Instructions:  Discharge Instructions    Call MD for:   Complete by: As directed    Call MD for Worsening visual changes, headaches,nausea, vomiting, weakness/numbness in extremities or seizures   Call MD for:  persistant dizziness or light-headedness   Complete by: As directed    Call MD for:  persistant nausea and vomiting   Complete by: As directed    Call MD for:  temperature >100.4   Complete by: As directed    Diet - low sodium heart healthy   Complete by: As directed    Discharge instructions   Complete by: As directed    PCP in 5 days, Radiation Oncology On Monday as scheduled, Oncology clinic Dr Benay Spice next week, HIV clinic as scheduled   Driving Restrictions   Complete by: As directed    No driving until cleared by PCP/primary oncologist     Allergies as of 05/01/2019      Reactions   Lisinopril Anaphylaxis,  Swelling   Swelling of tongue and mouth 11/05/16- tolerates Olmesartan   Pepcid [famotidine] Other (See Comments)   PPI H2, BLOCKERS LOWER GASTRIC PH WHICH WOULD LEAD TO SUBTHERAPEUTIC RILPIVIRINE LEVELS AND POTENTIAL VIROLOGICAL FAILURE WITH RESISTANCE   Prilosec [omeprazole] Other (See Comments)   PPI H2, BLOCKERS LOWER GASTRIC PH WHICH WOULD LEAD TO SUBTHERAPEUTIC RILPIVIRINE LEVELS AND POTENTIAL VIROLOGICAL FAILURE WITH RESISTANCE   Tums [calcium Carbonate Antacid] Other (See Comments)   TUMS ANTACIDS CAN LOWER GASTRIC PH WHICH COULD  LEAD TO SUBTHERAPEUTIC RILPIVIRINE LEVELS AND POTENTIAL VIROLOGICAL FAILURE WITH RESISTANCE TUMS CAN BE GIVEN BUT NEED CONSULT WITH ID PHARMACY RE TIMING. I PREFER HER TO AVOID ALL TOGETHER      Medication List    STOP taking these medications   ELDERBERRY PO   naproxen sodium 220 MG tablet Commonly known as: ALEVE     TAKE these medications   acetaminophen 500 MG tablet Commonly known as: TYLENOL Take 500 mg by mouth every 6 (six) hours as needed (inflammation).   BENGAY EX Apply 1 application topically daily as needed (muscle pain).   dexamethasone 4 MG tablet Commonly known as: DECADRON Take 1 tablet (4 mg total) by mouth 4 (four) times daily for 10 days.   docusate sodium 100 MG capsule Commonly known as: COLACE Take 2 capsules (200 mg total) by mouth daily. Start taking on: May 02, 2019   feeding  supplement (ENSURE ENLIVE) Liqd Take 237 mLs by mouth 3 (three) times daily between meals.   HYDROcodone-acetaminophen 5-325 MG tablet Commonly known as: NORCO/VICODIN Take one tablet four times a day as needed for pain   hydrocortisone cream 1 % Apply 1 application topically daily as needed for itching.   Juluca 50-25 MG Tabs Generic drug: Dolutegravir-Rilpivirine TAKE 1 TABLET BY MOUTH DAILY WITH BREAKFAST. TAKE WITH THE PREZCOBIX. What changed: See the new instructions.   levothyroxine 200 MCG tablet Commonly known as:  SYNTHROID Take 1 tablet (200 mcg total) by mouth daily before breakfast.   lidocaine-prilocaine cream Commonly known as: EMLA APPLY 1 APPLICATION TOPICALLY AS NEEDED. APPLY TO PORT SITE 1 HOUR PRIOR TO ACCESS   Loratadine 10 MG Caps Take 1 capsule by mouth as needed.   methocarbamol 500 MG tablet Commonly known as: ROBAXIN Take 1 tablet (500 mg total) by mouth every 8 (eight) hours as needed for muscle spasms.   olmesartan-hydrochlorothiazide 40-25 MG tablet Commonly known as: BENICAR HCT TAKE 1 TABLET BY MOUTH DAILY.   Prezcobix 800-150 MG tablet Generic drug: darunavir-cobicistat TAKE 1 TABLET BY MOUTH DAILY. SWALLOW WHOLE. DO NOT CRUSH, BREAK OR CHEW TABLETS. TAKE WITH FOOD. What changed: See the new instructions.   REPRIEVE [phone] pitavastatin or placebo 4 mg Tabs tablet Take 1 tablet (4 mg total) by mouth daily.   Selzentry 150 MG tablet Generic drug: maraviroc TAKE ONE TABLET BY MOUTH TWICE DAILY What changed: how much to take   SYSTANE BALANCE OP Place 1 drop into both eyes 4 (four) times daily as needed.       Allergies  Allergen Reactions  . Lisinopril Anaphylaxis and Swelling    Swelling of tongue and mouth 11/05/16- tolerates Olmesartan  . Pepcid [Famotidine] Other (See Comments)    PPI H2, BLOCKERS LOWER GASTRIC PH WHICH WOULD LEAD TO SUBTHERAPEUTIC RILPIVIRINE LEVELS AND POTENTIAL VIROLOGICAL FAILURE WITH RESISTANCE  . Prilosec [Omeprazole] Other (See Comments)    PPI H2, BLOCKERS LOWER GASTRIC PH WHICH WOULD LEAD TO SUBTHERAPEUTIC RILPIVIRINE LEVELS AND POTENTIAL VIROLOGICAL FAILURE WITH RESISTANCE  . Tums [Calcium Carbonate Antacid] Other (See Comments)    TUMS ANTACIDS CAN LOWER GASTRIC PH WHICH COULD  LEAD TO SUBTHERAPEUTIC RILPIVIRINE LEVELS AND POTENTIAL VIROLOGICAL FAILURE WITH RESISTANCE TUMS CAN BE GIVEN BUT NEED CONSULT WITH ID PHARMACY RE TIMING. I PREFER HER TO AVOID ALL TOGETHER      The results of significant diagnostics from this  hospitalization (including imaging, microbiology, ancillary and laboratory) are listed below for reference.    Labs: BNP (last 3 results) No results for input(s): BNP in the last 8760 hours. Basic Metabolic Panel: Recent Labs  Lab 04/28/19 1054  NA 139  K 3.8  CL 103  CO2 25  GLUCOSE 91  BUN 18  CREATININE 1.48*  CALCIUM 9.8   Liver Function Tests: Recent Labs  Lab 04/28/19 1054  AST 36  ALT 14  ALKPHOS 65  BILITOT 0.4  PROT 7.7  ALBUMIN 3.5   No results for input(s): LIPASE, AMYLASE in the last 168 hours. No results for input(s): AMMONIA in the last 168 hours. CBC: Recent Labs  Lab 04/28/19 1054  WBC 9.9  NEUTROABS 6.7  HGB 11.3*  HCT 35.5*  MCV 95.7  PLT 353   Cardiac Enzymes: No results for input(s): CKTOTAL, CKMB, CKMBINDEX, TROPONINI in the last 168 hours. BNP: Invalid input(s): POCBNP CBG: Recent Labs  Lab 04/28/19 0952  GLUCAP 74   D-Dimer No results for input(s): DDIMER  in the last 72 hours. Hgb A1c No results for input(s): HGBA1C in the last 72 hours. Lipid Profile No results for input(s): CHOL, HDL, LDLCALC, TRIG, CHOLHDL, LDLDIRECT in the last 72 hours. Thyroid function studies No results for input(s): TSH, T4TOTAL, T3FREE, THYROIDAB in the last 72 hours.  Invalid input(s): FREET3 Anemia work up No results for input(s): VITAMINB12, FOLATE, FERRITIN, TIBC, IRON, RETICCTPCT in the last 72 hours. Urinalysis    Component Value Date/Time   COLORURINE YELLOW 04/28/2019 1046   APPEARANCEUR CLEAR 04/28/2019 1046   LABSPEC 1.012 04/28/2019 1046   PHURINE 5.0 04/28/2019 1046   GLUCOSEU NEGATIVE 04/28/2019 1046   GLUCOSEU NEG mg/dL 07/23/2006 2113   HGBUR NEGATIVE 04/28/2019 1046   BILIRUBINUR NEGATIVE 04/28/2019 1046   KETONESUR NEGATIVE 04/28/2019 1046   PROTEINUR NEGATIVE 04/28/2019 1046   UROBILINOGEN 1 07/23/2006 2113   NITRITE NEGATIVE 04/28/2019 1046   LEUKOCYTESUR NEGATIVE 04/28/2019 1046   Sepsis Labs Invalid input(s):  PROCALCITONIN,  WBC,  LACTICIDVEN Microbiology Recent Results (from the past 240 hour(s))  SARS CORONAVIRUS 2 (TAT 6-24 HRS) Nasopharyngeal Nasopharyngeal Swab     Status: None   Collection Time: 04/28/19  3:20 PM   Specimen: Nasopharyngeal Swab  Result Value Ref Range Status   SARS Coronavirus 2 NEGATIVE NEGATIVE Final    Comment: (NOTE) SARS-CoV-2 target nucleic acids are NOT DETECTED. The SARS-CoV-2 RNA is generally detectable in upper and lower respiratory specimens during the acute phase of infection. Negative results do not preclude SARS-CoV-2 infection, do not rule out co-infections with other pathogens, and should not be used as the sole basis for treatment or other patient management decisions. Negative results must be combined with clinical observations, patient history, and epidemiological information. The expected result is Negative. Fact Sheet for Patients: SugarRoll.be Fact Sheet for Healthcare Providers: https://www.woods-mathews.com/ This test is not yet approved or cleared by the [place] FDA and  has been authorized for detection and/or diagnosis of SARS-CoV-2 by FDA under an Emergency Use Authorization (EUA). This EUA will remain  in effect (meaning this test can be used) for the duration of the COVID-19 declaration under Section 56 4(b)(1) of the Act, 21 U.S.C. section 360bbb-3(b)(1), unless the authorization is terminated or revoked sooner. Performed at [facility], [place] [address] [place], [place] [address]     Procedures/Studies: Mr Jeri Cos And Wo Contrast  Result Date: 04/28/2019 CLINICAL DATA:  Focal neuro deficit, greater than 6 hours, stroke suspected. Additional history provided: History of breast cancer on chemotherapy currently 3 week history of blurred vision and trouble with depth perception causing difficulty ambulating. EXAM: MRI HEAD WITHOUT AND WITH CONTRAST TECHNIQUE: Multiplanar, multiecho  pulse sequences of the brain and surrounding structures were obtained without and with intravenous contrast. CONTRAST:  12mL GADAVIST GADOBUTROL 1 MMOL/ML IV SOLN COMPARISON:  PET-CT 03/11/2019, head CT 04/28/2007 FINDINGS: Brain: There is no evidence of acute infarct. There is a large irregular enhancing mass centered within the right occipital lobe measuring 3.8 x 2.4 x 4.0 cm (AP x TV x CC). Findings are consistent with a dominant intracranial metastasis. Curvilinear SWI signal loss at this site which may reflect a small amount of non acute hemorrhage or prominent vessels. Overlying dural thickening. Moderate surrounding vasogenic edema which extends anteriorly to involve portions of the callosal splenium. Associated mass effect with partial effacement of the posterior right lateral ventricle. 4 mm leftward midline shift. Multiple additional metastases within the left occipital lobe, the largest measuring 1.6 x 1.4 cm in  transaxial dimension (series 19, image 84). The second largest lesion within the left occipital lobe measures 1.3 x 1.2 cm in transaxial dimensions (series 19, image 102). Mild to moderate surrounding vasogenic edema. There are numerous additional subcentimeter enhancing metastases within the bilateral cerebral hemispheres, right basal ganglia, pons and cerebellum, many of which have mild surrounding vasogenic edema (see annotations on postcontrast axial sequence 19 for the largest lesions). No extra-axial fluid collection.  Cerebral volume is normal for age. Vascular: Flow voids maintained within the proximal large arterial vessels. Skull and upper cervical spine: A lytic right skull base lesion was better appreciated on PET-CT 03/11/2019. No definite lesion within the visualized cervical spine. Sinuses/Orbits: Visualized orbits demonstrate no acute abnormality. Minimal ethmoid sinus mucosal thickening. Large left maxillary sinus mucous retention cyst. No significant mastoid effusion. These  results were called by telephone at the time of interpretation on 04/28/2019 at 2:49 pm to provider Dr. Gustavus Messing, who verbally acknowledged these results. 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 Dominant metastasis within the right occipital lobe measuring 3.8 x 2.4 x 4.0 cm with moderate surrounding edema. Associated mass effect with partial effacement of the posterior right lateral ventricle and 4 mm leftward midline shift. Also of note, metastases are present within the left occipital lobe measuring up to 1.6 x 1.4 cm with mild surrounding vasogenic edema. Findings likely account for the patient's visual deficits. No evidence of acute infarct. Lytic right skull base lesion better appreciated on prior PET-CT. Electronically Signed   By: Kellie Simmering DO   On: 04/28/2019 14:50     Time coordinating discharge: Over 30 minutes  SIGNED:   Guilford Shi, MD  [facility] 05/01/2019, 1:21 PM Pager : [phone]

## 2019-05-01 NOTE — Care Management Important Message (Signed)
Important Message  Patient Details IM Letter given to Marney Doctor RN to present to the Patient Name: Kayla Price MRN: [id number] Date of Birth: [birth year]   Medicare Important Message Given:  Yes     Kerin Salen 05/01/2019, 10:56 AM

## 2019-05-04 ENCOUNTER — Ambulatory Visit
Admission: RE | Admit: 2019-05-04 | Discharge: 2019-05-04 | Disposition: A | Discharge status: Home / Self Care | Payer: Medicare HMO | Source: Ambulatory Visit | Attending: Radiation Oncology | Admitting: Radiation Oncology

## 2019-05-04 ENCOUNTER — Telehealth: Payer: Self-pay | Admitting: *Deleted

## 2019-05-04 ENCOUNTER — Other Ambulatory Visit: Payer: Self-pay

## 2019-05-04 DIAGNOSIS — C7931 Secondary malignant neoplasm of brain: Secondary | ICD-10-CM | POA: Diagnosis not present

## 2019-05-04 DIAGNOSIS — Z510 Encounter for antineoplastic radiation therapy: Secondary | ICD-10-CM | POA: Diagnosis not present

## 2019-05-04 NOTE — Telephone Encounter (Signed)
Transition Care Management Follow-up Telephone Call  Date of discharge and from where: 05/01/19 [facility]  How have you been since you were released from the hospital? better Any questions or concerns? no  Cannot have sooner appointment due to everyday radiation thru the 18th.  Items Reviewed:  Did the pt receive and understand the discharge instructions provided? Yes   Medications obtained and verified? Yes   Any new allergies since your discharge? No   Dietary orders reviewed? Yes  Do you have support at home? Yes  Family  Other (ie: DME, Home Health, etc) Home Health  Functional Questionnaire: (I = Independent and D = Dependent) ADL's: I  Bathing/Dressing- I   Meal Prep- I  Eating- I  Maintaining continence- I  Transferring/Ambulation- I  Managing Meds- I   Follow up appointments reviewed:    PCP Hospital f/u appt confirmed? Yes  Scheduled to see Janett Billow on 05/08/19 .  [facility] f/u appt confirmed? Yes  Scheduled to see oncologist  Are transportation arrangements needed? No   If their condition worsens, is the pt aware to call  their PCP or go to the ED? Yes  Was the patient provided with contact information for the PCP's office or ED? Yes  Was the pt encouraged to call back with questions or concerns? Yes

## 2019-05-05 ENCOUNTER — Telehealth: Payer: Self-pay | Admitting: Oncology

## 2019-05-05 ENCOUNTER — Ambulatory Visit
Admission: RE | Admit: 2019-05-05 | Discharge: 2019-05-05 | Disposition: A | Discharge status: Home / Self Care | Payer: Medicare HMO | Source: Ambulatory Visit | Attending: Radiation Oncology | Admitting: Radiation Oncology

## 2019-05-05 ENCOUNTER — Other Ambulatory Visit: Payer: Self-pay

## 2019-05-05 ENCOUNTER — Other Ambulatory Visit: Payer: Self-pay | Admitting: Oncology

## 2019-05-05 DIAGNOSIS — C7931 Secondary malignant neoplasm of brain: Secondary | ICD-10-CM | POA: Diagnosis not present

## 2019-05-05 DIAGNOSIS — Z510 Encounter for antineoplastic radiation therapy: Secondary | ICD-10-CM | POA: Diagnosis not present

## 2019-05-05 NOTE — Telephone Encounter (Signed)
Scheduled appt per 11/10 sch message - pt is aware  Of appt and time

## 2019-05-06 ENCOUNTER — Ambulatory Visit
Admission: RE | Admit: 2019-05-06 | Discharge: 2019-05-06 | Disposition: A | Discharge status: Home / Self Care | Payer: Medicare HMO | Source: Ambulatory Visit | Attending: Radiation Oncology | Admitting: Radiation Oncology

## 2019-05-06 ENCOUNTER — Other Ambulatory Visit: Payer: Self-pay

## 2019-05-06 DIAGNOSIS — Z510 Encounter for antineoplastic radiation therapy: Secondary | ICD-10-CM | POA: Diagnosis not present

## 2019-05-06 DIAGNOSIS — C7931 Secondary malignant neoplasm of brain: Secondary | ICD-10-CM | POA: Diagnosis not present

## 2019-05-07 ENCOUNTER — Other Ambulatory Visit: Payer: Self-pay

## 2019-05-07 ENCOUNTER — Ambulatory Visit
Admission: RE | Admit: 2019-05-07 | Discharge: 2019-05-07 | Disposition: A | Discharge status: Home / Self Care | Payer: Medicare HMO | Source: Ambulatory Visit | Attending: Radiation Oncology | Admitting: Radiation Oncology

## 2019-05-07 DIAGNOSIS — Z510 Encounter for antineoplastic radiation therapy: Secondary | ICD-10-CM | POA: Diagnosis not present

## 2019-05-07 DIAGNOSIS — C7931 Secondary malignant neoplasm of brain: Secondary | ICD-10-CM | POA: Diagnosis not present

## 2019-05-08 ENCOUNTER — Other Ambulatory Visit: Payer: Self-pay

## 2019-05-08 ENCOUNTER — Ambulatory Visit
Admission: RE | Admit: 2019-05-08 | Discharge: 2019-05-08 | Disposition: A | Discharge status: Home / Self Care | Payer: Medicare HMO | Source: Ambulatory Visit | Attending: Radiation Oncology | Admitting: Radiation Oncology

## 2019-05-08 DIAGNOSIS — C7931 Secondary malignant neoplasm of brain: Secondary | ICD-10-CM | POA: Diagnosis not present

## 2019-05-08 DIAGNOSIS — Z510 Encounter for antineoplastic radiation therapy: Secondary | ICD-10-CM | POA: Diagnosis not present

## 2019-05-08 MED FILL — LEVOTHYROXINE 200 MCG TAB: 200 | 30 days supply | Qty: 30 | Fill #2

## 2019-05-11 ENCOUNTER — Ambulatory Visit
Admission: RE | Admit: 2019-05-11 | Discharge: 2019-05-11 | Disposition: A | Discharge status: Home / Self Care | Payer: Medicare HMO | Source: Ambulatory Visit | Attending: Radiation Oncology | Admitting: Radiation Oncology

## 2019-05-11 ENCOUNTER — Other Ambulatory Visit: Payer: Self-pay

## 2019-05-11 DIAGNOSIS — C7931 Secondary malignant neoplasm of brain: Secondary | ICD-10-CM | POA: Diagnosis not present

## 2019-05-11 DIAGNOSIS — Z510 Encounter for antineoplastic radiation therapy: Secondary | ICD-10-CM | POA: Diagnosis not present

## 2019-05-12 ENCOUNTER — Other Ambulatory Visit: Payer: Self-pay

## 2019-05-12 ENCOUNTER — Ambulatory Visit
Admission: RE | Admit: 2019-05-12 | Discharge: 2019-05-12 | Disposition: A | Discharge status: Home / Self Care | Payer: Medicare HMO | Source: Ambulatory Visit | Attending: Radiation Oncology | Admitting: Radiation Oncology

## 2019-05-12 DIAGNOSIS — Z510 Encounter for antineoplastic radiation therapy: Secondary | ICD-10-CM | POA: Diagnosis not present

## 2019-05-12 DIAGNOSIS — C7931 Secondary malignant neoplasm of brain: Secondary | ICD-10-CM | POA: Diagnosis not present

## 2019-05-12 MED ORDER — DEXAMETHASONE 4 MG PO TABS: ORAL_TABLET | ORAL | 0 refills | Status: DC

## 2019-05-13 ENCOUNTER — Ambulatory Visit
Admission: RE | Admit: 2019-05-13 | Discharge: 2019-05-13 | Disposition: A | Discharge status: Home / Self Care | Payer: Medicare HMO | Source: Ambulatory Visit | Attending: Radiation Oncology | Admitting: Radiation Oncology

## 2019-05-13 ENCOUNTER — Other Ambulatory Visit: Payer: Self-pay

## 2019-05-13 ENCOUNTER — Encounter: Payer: Self-pay | Admitting: Radiation Oncology

## 2019-05-13 DIAGNOSIS — Z510 Encounter for antineoplastic radiation therapy: Secondary | ICD-10-CM | POA: Diagnosis not present

## 2019-05-13 DIAGNOSIS — C7931 Secondary malignant neoplasm of brain: Secondary | ICD-10-CM | POA: Diagnosis not present

## 2019-05-15 ENCOUNTER — Other Ambulatory Visit: Payer: Self-pay

## 2019-05-15 ENCOUNTER — Ambulatory Visit (INDEPENDENT_AMBULATORY_CARE_PROVIDER_SITE_OTHER): Payer: Medicare HMO | Admitting: Nurse Practitioner

## 2019-05-15 ENCOUNTER — Encounter: Payer: Self-pay | Admitting: Nurse Practitioner

## 2019-05-15 VITALS — BP 102/68 | HR 90 | Temp 97.7°F | Ht 67.0 in | Wt 197.8 lb

## 2019-05-15 DIAGNOSIS — M1712 Unilateral primary osteoarthritis, left knee: Secondary | ICD-10-CM | POA: Diagnosis not present

## 2019-05-15 DIAGNOSIS — D638 Anemia in other chronic diseases classified elsewhere: Secondary | ICD-10-CM | POA: Diagnosis not present

## 2019-05-15 DIAGNOSIS — C7931 Secondary malignant neoplasm of brain: Secondary | ICD-10-CM

## 2019-05-15 DIAGNOSIS — I10 Essential (primary) hypertension: Secondary | ICD-10-CM

## 2019-05-15 DIAGNOSIS — E039 Hypothyroidism, unspecified: Secondary | ICD-10-CM

## 2019-05-15 DIAGNOSIS — B20 Human immunodeficiency virus [HIV] disease: Secondary | ICD-10-CM

## 2019-05-15 DIAGNOSIS — R634 Abnormal weight loss: Secondary | ICD-10-CM

## 2019-05-15 DIAGNOSIS — N183 Chronic kidney disease, stage 3 unspecified: Secondary | ICD-10-CM | POA: Diagnosis not present

## 2019-05-15 MED ORDER — OLMESARTAN MEDOXOMIL 40 MG PO TABS: 40.0000 mg | ORAL_TABLET | Freq: Every day | ORAL | 1 refills | Status: DC

## 2019-05-15 NOTE — Patient Instructions (Addendum)
To start plain olmesartan (benicar) 40 mg by mouth daily  STOP combination blood pressure pill (benicar hct)   Follow up in 4 weeks for blood pressure via virtual visit.

## 2019-05-15 NOTE — Progress Notes (Signed)
Careteam: Patient Care Team: Lauree Chandler, NP as PCP - General (Nurse Practitioner) Tommy Medal, Lavell Islam, MD as PCP - Infectious Diseases (Infectious Diseases) Clent Jacks, MD as Consulting Physician (Ophthalmology) Renato Shin, MD as Consulting Physician (Endocrinology) Frederik Pear, MD as Consulting Physician (Orthopedic Surgery) Magrinat, Virgie Dad, MD as Consulting Physician (Oncology) Fanny Skates, MD as Consulting Physician (General Surgery) Donnamae Jude, MD as Consulting Physician (Obstetrics and Gynecology) Delice Bison Charlestine Massed, NP as Nurse Practitioner (Hematology and Oncology) Webb Laws, [place] as Referring Physician (Optometry)  Advanced Directive information Does Patient Have a Medical Advance Directive?: Yes, Type of Advance Directive: Living will, Does patient want to make changes to medical advance directive?: No - Patient declined  Allergies  Allergen Reactions  . Lisinopril Anaphylaxis and Swelling    Swelling of tongue and mouth 11/05/16- tolerates Olmesartan  . Pepcid [Famotidine] Other (See Comments)    PPI H2, BLOCKERS LOWER GASTRIC PH WHICH WOULD LEAD TO SUBTHERAPEUTIC RILPIVIRINE LEVELS AND POTENTIAL VIROLOGICAL FAILURE WITH RESISTANCE  . Prilosec [Omeprazole] Other (See Comments)    PPI H2, BLOCKERS LOWER GASTRIC PH WHICH WOULD LEAD TO SUBTHERAPEUTIC RILPIVIRINE LEVELS AND POTENTIAL VIROLOGICAL FAILURE WITH RESISTANCE  . Tums [Calcium Carbonate Antacid] Other (See Comments)    TUMS ANTACIDS CAN LOWER GASTRIC PH WHICH COULD  LEAD TO SUBTHERAPEUTIC RILPIVIRINE LEVELS AND POTENTIAL VIROLOGICAL FAILURE WITH RESISTANCE TUMS CAN BE GIVEN BUT NEED CONSULT WITH ID PHARMACY RE TIMING. I PREFER HER TO AVOID ALL TOGETHER    Chief Complaint  Patient presents with  . Transitions Of Care    TOC follow-up 04/28/2019-05/01/2019 for Mestastatic Cancer of Brain   . Immunizations    Discuss need for TD/Tdap      HPI: Patient is a 63 y.o. female seen  in the office today for hospital follow up. Went to the ED due to visual deficits. Depth perception was off and she was concerned. medical history significant ofmetastatic right breast cancer (right lung/pleura/ lymph nodes and T1 skeletal metastasis)-currently on chemotherapy, HIV, hypertension, hyperlipidemia, chronic kidney disease, anemia of chronic disease went to emergency department due to visual changes- reports that since 3 to 4 weeks she has noticed problem with her vision especially with depth perception which affects her balance and mobility. She states it takes a minute to focus on objects to see them clearly. 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 Dominant metastasis within the right occipital lobe measures 3.8 x 2.4 x 2 4.0 cm with surrounding edema, associated mass-effectwith partial effacement of the posterior right lateral ventricle and 4 mm leftward midline shift.Metastases are also present in the left occipital lobe measuring up to 1.6 x 1.4 cm with mild surrounding vasogenic edema. She was started on IV dexamethasone.Case was reviewed by neurosurgery and recommended Radiation oncology consultfor SRS versus whole brain radiation;no indication for surgical intervention.  Reports her radiation was quick.   Reports the only side effect she has had from the medication was funny taste in her mouth and some weakness. 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 Has a sleeve but does not feel like she needs it at this time. Touch sensitivity is bad, she noticed this was worse when she was in the hospital. Gabapentin and lyrica did nothing so she does not believe in taking anything that does not work  All medications are filled and  up to date.   Missed follow up with Dr Loanne Drilling due to hospitalization- TSH was at goal on last  check- this was reviewed with pt during [place].   Constipation- takes [employer] brand equate which helps with BM. Does not eat a lot.  Has lost 80 lbs since October of 2019 "was not trying to do it this way"  Very good support system at home. Lives with her daughter and granddaughter. Son is close. Large church family.   htn- at home ranges from 120-130/70-90, urinating a lot.   CKD- slighly more elevated in the hospital.   HIV- continues ID follow up.   General ache and pain- will hurt sometimes, rarely needs methocarbomol and hydrocodone- Dr Jana Hakim refills.    Review of Systems:  Review of Systems  Constitutional: Positive for malaise/fatigue. Negative for chills, fever and weight loss.  HENT: Negative for tinnitus.   Respiratory: Negative for cough, sputum production and shortness of breath.   Cardiovascular: Negative for chest pain, palpitations and leg swelling.  Gastrointestinal: Negative for abdominal pain, constipation, diarrhea and heartburn.  Genitourinary: Negative for dysuria, frequency and urgency.  Musculoskeletal: Positive for back pain, joint pain and myalgias. Negative for falls.  Skin: Negative.   Neurological: Negative for dizziness and headaches.  Psychiatric/Behavioral: Negative for depression and memory loss. The patient does not have insomnia.     Past Medical History:  Diagnosis Date  . Alopecia areata 11/28/2009  . Bell's palsy   . Cancer [facility]) 2005   Breast cancer   chemotherapy and radiation  . CKD (chronic kidney disease) stage 3, GFR 30-59 ml/min 06/08/2015  . Dry eye syndrome   . Family history of lung cancer   . Family history of non-Hodgkin's lymphoma   . Fasting hyperglycemia   . Gestational diabetes    2001  . HIP FRACTURE, RIGHT 05/06/2008  . History of kidney stones   . HIV DISEASE 03/27/2006  . HYPERLIPIDEMIA, MIXED 12/15/2007  . HYPERTENSION 03/27/2006  . 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 MENORRHAGIA, POSTMENOPAUSAL 02/03/2009  .  Osteoarthritis of left knee 06/08/2015  . OSTEOARTHROSIS, LOCAL, SCND, UNSPC SITE 04/07/2007  . Personal history of chemotherapy 11/2018  . PVD 04/07/2007  . Tinea capitis   . TRIGGER FINGER 05/06/2008  . Unspecified vitamin D deficiency 08/06/2007   Past Surgical History:  Procedure Laterality Date  . BREAST LUMPECTOMY Right    2005  . CHEST TUBE INSERTION Right 05/30/2018   Procedure: INSERTION PLEURAL DRAINAGE CATHETER;  Surgeon: Grace Isaac, MD;  Location: [place];  Service: Thoracic;  Laterality: Right;  . COLONOSCOPY    . COLONOSCOPY WITH ESOPHAGOGASTRODUODENOSCOPY (EGD)    . ENDOBRONCHIAL ULTRASOUND Bilateral 05/15/2018   Procedure: ENDOBRONCHIAL ULTRASOUND;  Surgeon: Collene Gobble, MD;  Location: [facility];  Service: Cardiopulmonary;  Laterality: Bilateral;  . FINE NEEDLE ASPIRATION BIOPSY  05/15/2018   Procedure: FINE NEEDLE ASPIRATION BIOPSY;  Surgeon: Collene Gobble, MD;  Location: [facility];  Service: Cardiopulmonary;;  . FLEXIBLE BRONCHOSCOPY  05/15/2018   Procedure: FLEXIBLE BRONCHOSCOPY;  Surgeon: Collene Gobble, MD;  Location: [facility];  Service: Cardiopulmonary;;  . HYSTEROSCOPY  2006  . IR IMAGING GUIDED PORT INSERTION  07/14/2018  . IR THORACENTESIS ASP PLEURAL SPACE W/IMG GUIDE  04/14/2018  . IR THORACENTESIS ASP PLEURAL SPACE W/IMG GUIDE  04/28/2018  . KNEE ARTHROSCOPY Left 2002  . LYMPH NODE DISSECTION  2005  . MASTECTOMY Right   . MASTECTOMY MODIFIED RADICAL Right 11/05/2016   Procedure: RIGHT MASTECTOMY MODIFIED  RADICAL;  Surgeon: Fanny Skates, MD;  Location: [place];  Service: General;  Laterality: Right;  . MODIFIED RADICAL MASTECTOMY Right 11/05/2016  . placement   of port-a-cath    . PLEURAL BIOPSY Right 05/30/2018   Procedure: PLEURAL BIOPSY;  Surgeon: Grace Isaac, MD;  Location: [place];  Service: Thoracic;  Laterality: Right;  . PLEURAL EFFUSION DRAINAGE Right 05/30/2018   Procedure: DRAINAGE OF PLEURAL EFFUSION;  Surgeon: Grace Isaac, MD;  Location: [place];  Service: Thoracic;  Laterality: Right;  . PORT-A-CATH REMOVAL  2006   insertion 2005  . PORT-A-CATH REMOVAL N/A 03/29/2017   Procedure: REMOVAL PORT-A-CATH;  Surgeon: Fanny Skates, MD;  Location: [place];  Service: General;  Laterality: N/A;  . PORTACATH PLACEMENT Right 11/05/2016   Procedure: INSERTION PORT-A-CATH WITH ULTRA SOUND;  Surgeon: Fanny Skates, MD;  Location: [place];  Service: General;  Laterality: Right;  . removal of port a cath  12/2014  . TALC PLEURODESIS Right 05/30/2018   Procedure: Pietro Cassis;  Surgeon: Grace Isaac, MD;  Location: [place];  Service: Thoracic;  Laterality: Right;  . THYROID SURGERY  2009   Ablation   . TOTAL KNEE ARTHROPLASTY Left 02/06/2016   Procedure: TOTAL KNEE ARTHROPLASTY;  Surgeon: Frederik Pear, MD;  Location: [place];  Service: Orthopedics;  Laterality: Left;  . TUBAL LIGATION    . VIDEO ASSISTED THORACOSCOPY Right 05/30/2018   Procedure: VIDEO ASSISTED THORACOSCOPY;  Surgeon: Grace Isaac, MD;  [facility];  Service: Thoracic;  Laterality: Right;   Social History:   reports that she has never smoked. She has never used smokeless tobacco. She reports that she does not drink alcohol or use drugs.  Family History  Problem Relation Age of Onset  . Heart attack Brother        Massive MI in 66s  . Stroke Brother        CAD  . Kidney disease Mother   . Stroke Mother   . Diabetes Mother   . Liver disease Sister   . COPD Sister        had I-131 rx of hyperthyroidism  . Diabetes Sister   . Stroke Sister   . Lung cancer Paternal Uncle        hx smoking  . Cancer Cousin   . Non-Hodgkin's lymphoma Cousin 25       cancer x3, in prostate and lung- unsure if met/spread or if primaries  . Heart failure Father   . Heart disease Father   . Arthritis Father   . Sarcoidosis Sister     Medications: Patient's Medications  New Prescriptions   No medications on file  Previous Medications    ACETAMINOPHEN (TYLENOL) 500 MG TABLET    Take 500 mg by mouth every 6 (six) hours as needed (inflammation).   DEXAMETHASONE (DECADRON) 4 MG TABLET    Take one tab by mouth twice daily for 7 days; then one tab daily for 7 days; then one-half tab for 7 days; then stop   FEEDING SUPPLEMENT, ENSURE ENLIVE, (ENSURE ENLIVE) LIQD    Take 237 mLs by mouth 3 (three) times daily between meals.   HYDROCODONE-ACETAMINOPHEN (NORCO/VICODIN) 5-325 MG TABLET    TAKE ONE TABLET BY MOUTH 4 TIMES A DAY AS INSTRUCTED   HYDROCORTISONE CREAM 1 %    Apply 1 application topically daily as needed for itching.   JULUCA 50-25 MG TABS    TAKE 1 TABLET BY MOUTH DAILY WITH BREAKFAST. TAKE  WITH THE PREZCOBIX.   LEVOTHYROXINE (SYNTHROID) 200 MCG TABLET    Take 1 tablet (200 mcg total) by mouth daily before breakfast.   LIDOCAINE-PRILOCAINE (EMLA) CREAM    APPLY 1 APPLICATION TOPICALLY AS NEEDED. APPLY TO PORT SITE 1 HOUR PRIOR TO ACCESS   LORATADINE 10 MG CAPS    Take 1 capsule by mouth as needed.    MENTHOL, TOPICAL ANALGESIC, (BENGAY EX)    Apply 1 application topically daily as needed (muscle pain).   METHOCARBAMOL (ROBAXIN) 500 MG TABLET    Take 1 tablet (500 mg total) by mouth every 8 (eight) hours as needed for muscle spasms.   OLMESARTAN-HYDROCHLOROTHIAZIDE (BENICAR HCT) 40-25 MG TABLET    TAKE 1 TABLET BY MOUTH DAILY.   PREZCOBIX 800-150 MG TABLET    TAKE 1 TABLET BY MOUTH DAILY. SWALLOW WHOLE. DO NOT CRUSH, BREAK OR CHEW TABLETS. TAKE WITH FOOD.   PROPYLENE GLYCOL (SYSTANE BALANCE OP)    Place 1 drop into both eyes 4 (four) times daily as needed.   SELZENTRY 150 MG TABLET    TAKE ONE TABLET BY MOUTH TWICE DAILY   STUDY - REPRIEVE A5332 - PITAVASTATIN 4 MG OR PLACEBO TABLET (PI-VAN DAM)    Take 1 tablet (4 mg total) by mouth daily.  Modified Medications   No medications on file  Discontinued Medications   DOCUSATE SODIUM (COLACE) 100 MG CAPSULE    Take 2 capsules (200 mg total) by mouth daily.    Physical Exam:   Vitals:   05/15/19 1105  BP: 102/68  Pulse: 90  Temp: 97.7 F (36.5 C)  TempSrc: Temporal  SpO2: 97%  Weight: 197 lb 12.8 oz (89.7 kg)  Height: _0  (1.702 m)   Body mass index is 30.98 kg/m. Wt Readings from Last 3 Encounters:  05/15/19 197 lb 12.8 oz (89.7 kg)  04/20/19 215 lb (97.5 kg)  04/17/19 216 lb (98 kg)    Physical Exam Constitutional:      General: She is not in acute distress.    Appearance: She is well-developed. She is not diaphoretic.     Comments: Weight loss noted  HENT:     Head: Normocephalic and atraumatic.     Mouth/Throat:     Pharynx: No oropharyngeal exudate.  Eyes:     Conjunctiva/sclera: Conjunctivae normal.     Pupils: Pupils are equal, round, and reactive to light.  Neck:     Musculoskeletal: Normal range of motion and neck supple.  Cardiovascular:     Rate and Rhythm: Normal rate and regular rhythm.     Heart sounds: Normal heart sounds.  Pulmonary:     Effort: Pulmonary effort is normal.     Breath sounds: Normal breath sounds.  Abdominal:     General: Bowel sounds are normal.     Palpations: Abdomen is soft.  Musculoskeletal:        General: No tenderness.  Skin:    General: Skin is warm and dry.  Neurological:     Mental Status: She is alert and oriented to person, place, and time.  Psychiatric:        Mood and Affect: Mood normal.     Labs reviewed: Basic Metabolic Panel: Recent Labs    01/09/19 1222  02/20/19 1324  04/03/19 1258 04/17/19 1304 04/28/19 1054  NA  --    < > 140   < > 139 139 139  K  --    < > 3.7   < > 3.7  3.9 3.8  CL  --    < > 102   < > 101 103 103  CO2  --    < > 26   < > _0 GLUCOSE  --    < > 85   < > 94 100* 91  BUN  --    < > 18   < > _1 CREATININE  --    < > 1.45*   < > 1.40* 1.35* 1.48*  CALCIUM  --    < > 10.6*   < > 9.7 9.8 9.8  TSH 7.373*  --  0.841  --  1.596  --   --    < > = values in this interval not displayed.   Liver Function Tests: Recent Labs    04/03/19 1258  04/17/19 1304 04/28/19 1054  AST 39 39 36  ALT _2 ALKPHOS 124 84 65  BILITOT 0.2* 0.3 0.4  PROT 8.2* 8.3* 7.7  ALBUMIN 3.7 3.9 3.5   No results for input(s): LIPASE, AMYLASE in the last 8760 hours. No results for input(s): AMMONIA in the last 8760 hours. CBC: Recent Labs    04/03/19 1258 04/17/19 1304 04/28/19 1054  WBC 27.1* 12.7* 9.9  NEUTROABS 16.3* 8.7* 6.7  HGB 11.7* 11.9* 11.3*  HCT 35.6* 37.2 35.5*  MCV 94.4 95.4 95.7  PLT 266 340 353   Lipid Panel: Recent Labs    09/18/18 0911  CHOL 179  HDL 54  LDLCALC 101*  TRIG 141  CHOLHDL 3.3   TSH: Recent Labs    01/09/19 1222 02/20/19 1324 04/03/19 1258  TSH 7.373* 0.841 1.596   A1C: Lab Results  Component Value Date   HGBA1C 6.1 03/07/2019     Assessment/Plan 1. Brain metastasis ([place]) Right breast cancer diagnosised in 2005 s/p lumpectomy with reurrence in 2018 requiring right mastectomy. Also had right lung/plural mets and effusion continues on chemo. Now with brain mets. She remains positive. Completed radiation treatment and continues her decadron titration. Continues with mild visual deficits at this time. Continues follow up with oncologist and chemotherapy.   2. Hypothyroidism, unspecified type -managed by endocrine but has missed follow up appt due to hospitalization. TSH has been obtained and WNL , results reviewed with pt. Continue current dose of synthroid.   3. Essential hypertension Blood pressure is optimally controlled. She is on combination benicar hctz with her CKD with stop HCTZ and have her take benicar only. To monitor blood pressure and we will follow up with this in 4 weeks.   4. Human immunodeficiency virus (HIV) disease ([place]) Followed by Dr Tommy Medal, continue current regimen.   5. Primary osteoarthritis of left knee Ongoing, no overwhelming pain. Continues on hydrocodone-apap by oncologist but rarely needs  6. Anemia of chronic disease Hgb stable on recent labs.   7.  Stage 3 chronic kidney disease, unspecified whether stage 3a or 3b CKD -Encourage proper hydration and to avoid NSAIDS (Aleve, Advil, Motrin, Ibuprofen), will stop hctz at this time and monitor.   8. Weight loss Continues to lose weight, BMI still elevated and pt works hard at getting adequate protein into her diet. To continue supplements.   Next appt: 4 weeks.  Carlos American. [place], [facility] [phone]

## 2019-05-18 NOTE — Progress Notes (Signed)
[facility]  [phone] Fax:([phone]    ID: Kayla Price DOB: 02-Nov-1955  MR#: [id number]  JYN#:[id number]  Patient Care Team: Sharon Seller, NP as PCP - General (Nurse Practitioner) Daiva Eves, Lisette Grinder, MD as PCP - Infectious Diseases (Infectious Diseases) Ernesto Rutherford, MD as Consulting Physician (Ophthalmology) Romero Belling, MD as Consulting Physician (Endocrinology) Gean Birchwood, MD as Consulting Physician (Orthopedic Surgery) Brantley Wiley, Valentino Hue, MD as Consulting Physician (Oncology) Claud Kelp, MD as Consulting Physician (General Surgery) Reva Bores, MD as Consulting Physician (Obstetrics and Gynecology) Axel Filler, Larna Daughters, NP as Nurse Practitioner (Hematology and Oncology) Glenford Peers, OD as Referring Physician (Optometry) OTHER MD:   CHIEF COMPLAINT: Triple negative breast cancer, recurrent  CURRENT TREATMENT:  eribulin; Zometa  INTERVAL HISTORY: Kayla Price returns today for follow-up and treatment of her recurrent triple negative breast cancer.  Since her last visit, she presented to the ED on 04/28/2019 with a 3-4 week history of visual changes. 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  She subsequently underwent whole brain irradiation from 04/30/2019 to 05/13/2019.  She did generally well with this.  The big problem she has she said was the dexamethasone.  She was originally started on 4 tablets daily, has been on a taper since.  Currently she is on 1 tablet a day and next week she  will be on 1/2 tablet daily after which she will be ready to stop.  She had been receiving Eribulin given every two weeks.  Her last treatment here was 04/17/2019.  She tells me she generally tolerated the eribulin well.  She does not have any peripheral neuropathy although she complains of pain and discomfort and some numbness in the right lower arm, from the elbow to the hand.  This is likely not due to the eribulin.  REVIEW OF SYSTEMS: Maddelyn feels her energy is slowly coming back.  She uses a cane at home.  She paces herself.  She denies any shortness of breath, cough, fever, or change in bowel movement.  She denies pain.  She is trying to do some yoga and some leg stretching exercises.  She thinks her appetite is "okay", and she is trying to eat more protein.  She was taken off diuretics and is only on olmesartan at present for her blood pressure issues.  She remains mildly constipated.  Currently she has no headaches visual changes nausea or vomiting and denies problems with falls.  A detailed review of systems was otherwise stable.      BREAST CANCER HISTORY: From the original intake note:  Kayla Price is a history of right-sided breast cancer dating back to 2005. She had a lumpectomy with sentinel lymph node sampling, chemotherapy, and radiation. I do not have access to those records at present.  More recently she [facility] showing a possible mass in the right breast. Diagnostic mammography with ultrasonography on 09/28/2016 the patient underwent right diagnostic mammography with tomography and right breast ultrasonography. The breast density was category A. In the right breast at the 10:00 position there was an irregular mass measuring 2.5 cm. Ultrasound identified this  the 10:00 radiant 10 cm from the nipple measuring 2.4 cm. In the right axilla there was an abnormal lymph node measuring 1.3 cm with other normal-appearing lymph  nodes.  On 10/01/2016 she  underwent biopsy of the right breast mass in question as well as the suspicious axillary lymph node. 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 The proliferation marker was 70% in the breast lesion and 50% in the lymph node.  Her subsequent history is as detailed below.   MEDICAL HISTORY: Past Medical History:  Diagnosis Date   Alopecia areata 11/28/2009   Bell's palsy    Cancer (HCC) 2005   Breast cancer   chemotherapy and radiation   CKD (chronic kidney disease) stage 3, GFR 30-59 ml/min 06/08/2015   Dry eye syndrome    Family history of lung cancer    Family history of non-Hodgkin's lymphoma    Fasting hyperglycemia    Gestational diabetes    2001   HIP FRACTURE, RIGHT 05/06/2008   History of kidney stones    HIV DISEASE 03/27/2006   HYPERLIPIDEMIA, MIXED 12/15/2007   HYPERTENSION 03/27/2006   HYPOTHYROIDISM, POST-RADIATION 06/28/2008   MENORRHAGIA, POSTMENOPAUSAL 02/03/2009   Osteoarthritis of left knee 06/08/2015   OSTEOARTHROSIS, LOCAL, SCND, UNSPC SITE 04/07/2007   Personal history of chemotherapy 11/2018   PVD 04/07/2007   Tinea capitis    TRIGGER FINGER 05/06/2008   Unspecified vitamin D deficiency 08/06/2007    SURGICAL HISTORY:  Past Surgical History:  Procedure Laterality Date   BREAST LUMPECTOMY Right    2005   CHEST TUBE INSERTION Right 05/30/2018   Procedure: INSERTION PLEURAL DRAINAGE CATHETER;  Surgeon: Delight Ovens, MD;  Location[facility] OR;  Service: Thoracic;  Laterality: Right;   COLONOSCOPY     COLONOSCOPY WITH ESOPHAGOGASTRODUODENOSCOPY (EGD)     ENDOBRONCHIAL ULTRASOUND Bilateral 05/15/2018   Procedure: ENDOBRONCHIAL ULTRASOUND;  Surgeon: Leslye Peer, MD;  Location: [facility] ENDOSCOPY;  Service: Cardiopulmonary;  Laterality: Bilateral;   FINE NEEDLE  ASPIRATION BIOPSY  05/15/2018   Procedure: FINE NEEDLE ASPIRATION BIOPSY;  Surgeon: Leslye Peer, MD;  Location: [facility] ENDOSCOPY;  Service: Cardiopulmonary;;   FLEXIBLE BRONCHOSCOPY  05/15/2018   Procedure: FLEXIBLE BRONCHOSCOPY;  Surgeon: Leslye Peer, MD;  Location: [facility] ENDOSCOPY;  Service: Cardiopulmonary;;   HYSTEROSCOPY  2006   IR IMAGING GUIDED PORT INSERTION  07/14/2018   IR THORACENTESIS ASP PLEURAL SPACE W/IMG GUIDE  04/14/2018   IR THORACENTESIS ASP PLEURAL SPACE W/IMG GUIDE  04/28/2018   KNEE ARTHROSCOPY Left 2002   LYMPH NODE DISSECTION  2005   MASTECTOMY Right    MASTECTOMY MODIFIED RADICAL Right 11/05/2016   Procedure: RIGHT MASTECTOMY MODIFIED RADICAL;  Surgeon: Claud Kelp, MD;  Location: [facility] OR;  Service: General;  Laterality: Right;   MODIFIED RADICAL MASTECTOMY Right 11/05/2016   placement   of port-a-cath     PLEURAL BIOPSY Right 05/30/2018   Procedure: PLEURAL BIOPSY;  Surgeon: Delight Ovens, MD;  Location: [facility] OR;  Service: Thoracic;  Laterality: Right;   PLEURAL EFFUSION DRAINAGE Right 05/30/2018   Procedure: DRAINAGE OF PLEURAL EFFUSION;  Surgeon: Delight Ovens, MD;  Location: [facility] OR;  Service: Thoracic;  Laterality: Right;   PORT-A-CATH REMOVAL  2006   insertion 2005   PORT-A-CATH REMOVAL N/A 03/29/2017   Procedure: REMOVAL PORT-A-CATH;  Surgeon: Claud Kelp, MD;  Location: [facility] OR;  Service: General;  Laterality: N/A;  PORTACATH PLACEMENT Right 11/05/2016   Procedure: INSERTION PORT-A-CATH WITH ULTRA SOUND;  Surgeon: Claud Kelp, MD;  Location: [facility] OR;  Service: General;  Laterality: Right;   removal of port a cath  12/2014   TALC PLEURODESIS Right 05/30/2018   Procedure: Lurlean Nanny;  Surgeon: Delight Ovens, MD;  Location: [facility] OR;  Service: Thoracic;  Laterality: Right;   THYROID SURGERY  2009   Ablation    TOTAL KNEE ARTHROPLASTY Left 02/06/2016   Procedure: TOTAL KNEE ARTHROPLASTY;  Surgeon: Gean Birchwood, MD;  Location:  [facility] OR;  Service: Orthopedics;  Laterality: Left;   TUBAL LIGATION     VIDEO ASSISTED THORACOSCOPY Right 05/30/2018   Procedure: VIDEO ASSISTED THORACOSCOPY;  Surgeon: Delight Ovens, MD;  Location: [facility] OR;  Service: Thoracic;  Laterality: Right;    FAMILY HISTORY: Family History  Problem Relation Age of Onset   Heart attack Brother        Massive MI in 16s   Stroke Brother        CAD   Kidney disease Mother    Stroke Mother    Diabetes Mother    Liver disease Sister    COPD Sister        had I-131 rx of hyperthyroidism   Diabetes Sister    Stroke Sister    Lung cancer Paternal Uncle        hx smoking   Cancer Cousin    Non-Hodgkin's lymphoma Cousin 25       cancer x3, in prostate and lung- unsure if met/spread or if primaries   Heart failure Father    Heart disease Father    Arthritis Father    Sarcoidosis Sister   The patient's father died at the age of 68 in the setting of Alzheimer's disease. The patient's mother died at the age of 55 from complications of diabetes. The patient has 3 brothers, 4 sisters. There is no history of breast or ovarian cancer in the family area.   GYNECOLOGIC HISTORY:  Patient's last menstrual period was 11/06/2003.  menarche age 60, first live birth age 46, the patient is GX P2. She stopped having periods in 2005, with her chemotherapy; she never took hormone replacement    SOCIAL HISTORY:  Morganne worked for the [employer] more than 20 years. 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 At home she lives with her daughter Freda Munro who works for [employer] (currently from home) and her granddaughter [name], 60 y/o as of November 2020.  The patient's son also drops in frequently.  They are all careful regarding pandemic precautions                          ADVANCED DIRECTIVES:  not in place    HEALTH MAINTENANCE: Social History   Tobacco Use   Smoking status: Never Smoker    Smokeless tobacco: Never Used  Substance Use Topics   Alcohol use: No   Drug use: No               Colonoscopy:             PAP:             Bone density:  Allergies  Allergen Reactions   Lisinopril Anaphylaxis and Swelling    Swelling of tongue and mouth 11/05/16- tolerates Olmesartan   Pepcid [Famotidine] Other (See Comments)    PPI H2, BLOCKERS LOWER GASTRIC  PH WHICH WOULD LEAD TO SUBTHERAPEUTIC RILPIVIRINE LEVELS AND POTENTIAL VIROLOGICAL FAILURE WITH RESISTANCE   Prilosec [Omeprazole] Other (See Comments)    PPI H2, BLOCKERS LOWER GASTRIC PH WHICH WOULD LEAD TO SUBTHERAPEUTIC RILPIVIRINE LEVELS AND POTENTIAL VIROLOGICAL FAILURE WITH RESISTANCE   Tums [Calcium Carbonate Antacid] Other (See Comments)    TUMS ANTACIDS CAN LOWER GASTRIC PH WHICH COULD  LEAD TO SUBTHERAPEUTIC RILPIVIRINE LEVELS AND POTENTIAL VIROLOGICAL FAILURE WITH RESISTANCE TUMS CAN BE GIVEN BUT NEED CONSULT WITH [facility] PHARM[facility] RE TIMING. I PREFER HER TO AVOID ALL TOGETHER    Current Outpatient Medications  Medication Sig Dispense Refill   acetaminophen (TYLENOL) 500 MG tablet Take 500 mg by mouth every 6 (six) hours as needed (inflammation).     dexamethasone (DECADRON) 4 MG tablet Take one tab by mouth twice daily for 7 days; then one tab daily for 7 days; then one-half tab for 7 days; then stop 25 tablet 0   feeding supplement, ENSURE ENLIVE, (ENSURE ENLIVE) LIQD Take 237 mLs by mouth 3 (three) times daily between meals. 237 mL 12   HYDROcodone-acetaminophen (NORCO/VICODIN) 5-325 MG tablet TAKE ONE TABLET BY MOUTH 4 TIMES A DAY AS INSTRUCTED 120 tablet 0   hydrocortisone cream 1 % Apply 1 application topically daily as needed for itching.     JULUCA 50-25 MG TABS TAKE 1 TABLET BY MOUTH DAILY WITH BREAKFAST. TAKE WITH THE PREZCOBIX. 30 tablet 3   levothyroxine (SYNTHROID) 200 MCG tablet Take 1 tablet (200 mcg total) by mouth daily before breakfast. 90 tablet 1   lidocaine-prilocaine (EMLA) cream APPLY 1  APPLICATION TOPICALLY AS NEEDED. APPLY TO PORT SITE 1 HOUR PRIOR TO ACCESS 30 g 0   Loratadine 10 MG CAPS Take 1 capsule by mouth as needed.      Menthol, Topical Analgesic, (BENGAY EX) Apply 1 application topically daily as needed (muscle pain).     methocarbamol (ROBAXIN) 500 MG tablet Take 1 tablet (500 mg total) by mouth every 8 (eight) hours as needed for muscle spasms. 90 tablet 1   olmesartan (BENICAR) 40 MG tablet Take 1 tablet (40 mg total) by mouth daily. 90 tablet 1   olmesartan-hydrochlorothiazide (BENICAR HCT) 40-25 MG tablet TAKE 1 TABLET BY MOUTH DAILY. 30 tablet 1   PREZCOBIX 800-150 MG tablet TAKE 1 TABLET BY MOUTH DAILY. SWALLOW WHOLE. DO NOT CRUSH, BREAK OR CHEW TABLETS. TAKE WITH FOOD. 30 tablet 3   Propylene Glycol (SYSTANE BALANCE OP) Place 1 drop into both eyes 4 (four) times daily as needed.     SELZENTRY 150 MG tablet TAKE ONE TABLET BY MOUTH TWICE DAILY 60 tablet 5   Study - REPRIEVE [phone] - pitavastatin 4 mg or placebo tablet (PI-[place]) Take 1 tablet (4 mg total) by mouth daily. 30 tablet    No current facility-administered medications for this visit.    Facility-Administered Medications Ordered in Other Visits  Medication Dose Route Frequency Provider Last Rate Last Dose   0.9 %  sodium chloride infusion   Intravenous Continuous Iness Pangilinan, Valentino Hue, MD 500 mL/hr at 05/19/19 1324     heparin lock flush 100 unit/mL  500 Units Intravenous Once Amilcar Reever, Valentino Hue, MD       sodium chloride flush (NS) 0.9 % injection 10 mL  10 mL Intravenous Once Kotaro Buer, Valentino Hue, MD         PHYSICAL EXAMINATION: Today's Vitals   05/19/19 1203  BP: 136/90  Pulse: 82  Resp: 18  Temp: 98.3 F (36.8 C)  TempSrc:  Temporal  SpO2: 100%  Weight: 193 lb 14.4 oz (88 kg)  Height: 5\' 7"  (1.702 m)   Body mass index is 30.37 kg/m.   Filed Weights   05/19/19 1203  Weight: 193 lb 14.4 oz (88 kg)  Weight was 222 pounds 04/03/2019  ECOG PERFORMANCE STATUS: 2 -  Symptomatic, <50% confined to bed  Sclerae unicteric, EOMs intact Wearing a mask No cervical or supraclavicular adenopathy Lungs no rales or rhonchi Heart regular rate and rhythm Abd soft, nontender, positive bowel sounds MSK no focal spinal tenderness Neuro: nonfocal, well oriented, appropriate affect Breasts: Deferred.  She is status post right mastectomy   LABORATORY DATA: Lab Results  Component Value Date   WBC 11.4 (H) 05/19/2019   HGB 12.3 05/19/2019   HCT 36.6 05/19/2019   MCV 92.0 05/19/2019   PLT 86 (L) 05/19/2019      Chemistry      Component Value Date/Time   NA 138 05/19/2019 1122   NA 140 05/13/2017 0927   K 4.8 05/19/2019 1122   K 4.1 05/13/2017 0927   CL 105 05/19/2019 1122   CO2 21 (L) 05/19/2019 1122   CO2 29 05/13/2017 0927   BUN 90 (H) 05/19/2019 1122   BUN 17.7 05/13/2017 0927   CREATININE 1.81 (H) 05/19/2019 1122   CREATININE 1.35 (H) 04/17/2019 1304   CREATININE 1.62 (H) 09/18/2018 0911   CREATININE 1.4 (H) 05/13/2017 0927   GLU 104 02/13/2016 1358      Component Value Date/Time   CALCIUM 9.9 05/19/2019 1122   CALCIUM 10.5 (H) 05/13/2017 0927   ALKPHOS 62 05/19/2019 1122   ALKPHOS 90 05/13/2017 0927   AST 18 05/19/2019 1122   AST 39 04/17/2019 1304   AST 14 05/13/2017 0927   ALT 35 05/19/2019 1122   ALT 12 04/17/2019 1304   ALT 12 05/13/2017 0927   BILITOT 0.9 05/19/2019 1122   BILITOT 0.3 04/17/2019 1304   BILITOT 0.27 05/13/2017 1093       RADIOGRAPHIC STUDIES:  Mr Laqueta Jean And Wo Contrast  Result Date: 04/28/2019 CLINICAL DATA:  Focal neuro deficit, greater than 6 hours, stroke suspected. Additional history provided: History of breast cancer on chemotherapy currently 3 week history of blurred vision and trouble with depth perception causing difficulty ambulating. EXAM: MRI HEAD WITHOUT AND WITH CONTRAST TECHNIQUE: Multiplanar, multiecho pulse sequences of the brain and surrounding structures were obtained without and with  intravenous contrast. CONTRAST:  10mL GADAVIST GADOBUTROL 1 MMOL/ML IV SOLN COMPARISON:  PET-CT 03/11/2019, head CT 04/28/2007 FINDINGS: Brain: There is no evidence of acute infarct. There is a large irregular enhancing mass centered within the right occipital lobe measuring 3.8 x 2.4 x 4.0 cm (AP x TV x CC). Findings are consistent with a dominant intracranial metastasis. Curvilinear SWI signal loss at this site which may reflect a small amount of non acute hemorrhage or prominent vessels. Overlying dural thickening. Moderate surrounding vasogenic edema which extends anteriorly to involve portions of the callosal splenium. Associated mass effect with partial effacement of the posterior right lateral ventricle. 4 mm leftward midline shift. Multiple additional metastases within the left occipital lobe, the largest measuring 1.6 x 1.4 cm in transaxial dimension (series 19, image 84). The second largest lesion within the left occipital lobe measures 1.3 x 1.2 cm in transaxial dimensions (series 19, image 102). Mild to moderate surrounding vasogenic edema. There are numerous additional subcentimeter enhancing metastases within the bilateral cerebral hemispheres, right basal ganglia, pons and cerebellum, many of which  have mild surrounding vasogenic edema (see annotations on postcontrast axial sequence 19 for the largest lesions). No extra-axial fluid collection.  Cerebral volume is normal for age. Vascular: Flow voids maintained within the proximal large arterial vessels. Skull and upper cervical spine: A lytic right skull base lesion was better appreciated on PET-CT 03/11/2019. No definite lesion within the visualized cervical spine. Sinuses/Orbits: Visualized orbits demonstrate no acute abnormality. Minimal ethmoid sinus mucosal thickening. Large left maxillary sinus mucous retention cyst. No significant mastoid effusion. These results were called by telephone at the time of interpretation on 04/28/2019 at 2:49 pm to  provider Dr. Julieanne Manson, who verbally acknowledged these results. 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 Dominant metastasis within the right occipital lobe measuring 3.8 x 2.4 x 4.0 cm with moderate surrounding edema. Associated mass effect with partial effacement of the posterior right lateral ventricle and 4 mm leftward midline shift. Also of note, metastases are present within the left occipital lobe measuring up to 1.6 x 1.4 cm with mild surrounding vasogenic edema. Findings likely account for the patient's visual deficits. No evidence of acute infarct. Lytic right skull base lesion better appreciated on prior PET-CT. Electronically Signed   By: Jackey Loge DO   On: 04/28/2019 14:50     ASSESSMENT: 63 y.o. [place] woman   (1) status post right lumpectomy and sentinel lymph node sampling October 2005 for a 0.6 cm invasive ductal carcinoma involving one out of 2 sentinel lymph nodes sampled, grade 3, triple-negative, treated adjuvantly with doxorubicin and cyclophosphamide 4 followed by weekly paclitaxel 7, followed by adjuvant radiation  RECURRENT DISEASE: (2) status post right breast upper outer quadrant biopsy and right axillary lymph node biopsy 10/01/2016, both positive for a T2 N1, stage IIIB invasive ductal carcinoma, triple negative, with an MIB-1 of 50-70%  (3) status post right modified radical mastectomy 11/05/2016 showing a pT2 pN1, stage IIIB invasive ductal carcinoma, grade 3, triple negative, with negative margins  (4) not a candidate for radiation given prior history  (5) adjuvant chemotherapy consisting of carboplatin and gemcitabine given days 1 and 8 of each 21 day cycle, for 6 cycles, starting 11/20/2016, completed 03/11/2017             (a) day 8 cycle 2 omitted because of neutropenia; Neupogen/Neulasta added  (5) HIV positivity: under care of Id [facility]ena[facility] [place])  (6) Genetic testing 06/17/2017: no  pathogenicmutations.Genes tested: APC, ATM, AXIN2, BARD1, BLM, BMPR1A, BRCA1, BRCA2, BRIP1, CDH1, CDK4, CDKN2A (p14ARF), CDKN2A (p16INK4a), CEBPA, CHEK2, CTNNA1, DICER1, EPCAM*, GATA2, GREM1*, HRAS, KIT, MEN1, MLH1, MSH2, MSH3, MSH6, MUTYH, NBN, NF1, PALB2, PDGFRA, PMS2, POLD1, POLE, PTEN, RAD50, RAD51C, RAD51D, RUNX1, SDHB, SDHC, SDHD, SMAD4, SMARCA4, STK11, TERC, TERT, TP53, TSC1, TSC2, VHL.The following genes were evaluated for sequence changes only: HOXB13*, NTHL1*, SDHA.              (a) A variant of uncertain significance (VUS)in a gene calledNTHL1was also noted.c.736G>A (p.Ala246Thr)  METASTATIC DISEASE: November 2019 (1) Patient seen in urgent care and ultimately ED on 04/14/2018 for shortness of breath, chest xray demonstrated Right pleural effusion.   (a) right thoracenteses on 10/21 and 11/4 results show atypical cells, non diagnostic (b) CT chest 04/22/2018 shows re-accumulation of fluid, and right pleural nodularity. (c) PET scan on 05/01/2018 shows hypermetabolic pleural based metastases, right CP angle nodal metastases, no evidence of malignancy in abdomen and pelvis. (d) bronchoscopy with biopsy by Dr. Delton Coombes and BAL on 05/15/2018 was non diagnostic             (  e) VAT biopsy of the right pleura 05/30/2018 confirms carcinoma, triple negative             (f) [facility] shows PD-L1 positive (1% in [facility]); otherwise microsatellite stable, TMB low (5/Mb), no PIK3 mutations; other mutations suggest sensitivity to MTOR inhibitors and several TKIs  (2) Atezolizumab started 07/15/2018 given every other week             (a) PET 08/13/2018 shows tumor Right hemithorax (new baseline study)             (b) Atezo changed to Q4w starting with 11/07/2018 dose             (c) PET 11/28/2018 documents progression in the right lung and pleural area as well as lymph nodes, and a T1 skeletal metastasis             (d) atezolizumab discontinued after 12/05/2018 dose  (3) eribulin day 1 and 8  of every 21 day cycle started 12/19/2018             (a) changed to every other week with Neulsta support due to neutropenia causing treatment delays and her h/o HIV (delays due to insurance denial of onpro after 02/20/2019 dose)             (b) PET scan on 03/11/2019 showed stable to improved measurable disease, with multiple new bone lesions  (c) zoledronate given every 12 weeks starting on 03/25/2019  (4) brain MRI 04/28/2019 documents multiple intracranial metastases             (a) whole brain radiation 04/30/2019 - 05/13/2019, 30 Gy in 10 fractions   PLAN:  Ichelle tolerated the whole brain irradiation generally well.  She is now on a Decadron taper and will be off within the next 3 weeks.  The effects of whole brain irradiation can be insidious and many patients do notice a significant cognitive drop over the months following.  There is also a limit to how many treatments can be given.  Certainly if she has central nervous system recurrence and a few isolated spots SRS can be done.  If there are innumerable spots whole brain irradiation cannot be repeated.  In short to the development of brain metastases is ominous and does indicate a significant limitation in Airyanna's prognosis.  This means our goal in the peripheral disease is more line of simple control and even if we do not have significant shrinkage, so long as we do not have significant growth for the next 3 to 6 months that would probably be adequate.  The question is whether eribulin is the best drug to continue.  It can cause peripheral neuropathy although it does not seem to be doing that to her.  I do not believe the problem she has in the right forearm is related to it although I suppose eribulin could be aggravating that to some extent.  Today I do think she is still a little bit too weak from the radiation and though she was willing to get treated we are giving her a week's medication.  This will allow her to get through the  holidays with her family.  She will continue the Decadron taper.  She will see [place] again in 1 week and at that point she will have her eribulin dose.  She will then have a repeat PET scan before proceeding with further treatments.  If we document disease progression we will switch the chemotherapy to CMF  She  knows to call for any other issue that may develop before the next visit. Valentino Hue. Braelin Costlow MD Oncology and Hematology [facility] [address] [place] Tel. [phone]  Valinda Hoar [phone]   IMickie Bail, am acting as scribe for Dr. Valentino Hue. Kenlie Seki.  I, Ruthann Cancer MD, have reviewed the above documentation for accuracy and completeness, and I agree with the above.

## 2019-05-19 ENCOUNTER — Other Ambulatory Visit: Payer: Self-pay

## 2019-05-19 ENCOUNTER — Inpatient Hospital Stay (HOSPITAL_BASED_OUTPATIENT_CLINIC_OR_DEPARTMENT_OTHER): Payer: Medicare HMO | Admitting: Oncology

## 2019-05-19 ENCOUNTER — Inpatient Hospital Stay: Payer: Medicare HMO

## 2019-05-19 ENCOUNTER — Inpatient Hospital Stay: Payer: Medicare HMO | Attending: Adult Health

## 2019-05-19 VITALS — BP 136/90 | HR 82 | Temp 98.3°F | Resp 18 | Ht 67.0 in | Wt 193.9 lb

## 2019-05-19 DIAGNOSIS — Z807 Family history of other malignant neoplasms of lymphoid, hematopoietic and related tissues: Secondary | ICD-10-CM | POA: Diagnosis not present

## 2019-05-19 DIAGNOSIS — C782 Secondary malignant neoplasm of pleura: Secondary | ICD-10-CM | POA: Diagnosis not present

## 2019-05-19 DIAGNOSIS — C771 Secondary and unspecified malignant neoplasm of intrathoracic lymph nodes: Secondary | ICD-10-CM | POA: Insufficient documentation

## 2019-05-19 DIAGNOSIS — C7801 Secondary malignant neoplasm of right lung: Secondary | ICD-10-CM

## 2019-05-19 DIAGNOSIS — C50919 Malignant neoplasm of unspecified site of unspecified female breast: Secondary | ICD-10-CM

## 2019-05-19 DIAGNOSIS — C7931 Secondary malignant neoplasm of brain: Secondary | ICD-10-CM | POA: Diagnosis not present

## 2019-05-19 DIAGNOSIS — Z171 Estrogen receptor negative status [ER-]: Secondary | ICD-10-CM | POA: Insufficient documentation

## 2019-05-19 DIAGNOSIS — C773 Secondary and unspecified malignant neoplasm of axilla and upper limb lymph nodes: Secondary | ICD-10-CM | POA: Insufficient documentation

## 2019-05-19 DIAGNOSIS — C787 Secondary malignant neoplasm of liver and intrahepatic bile duct: Secondary | ICD-10-CM

## 2019-05-19 DIAGNOSIS — Z9011 Acquired absence of right breast and nipple: Secondary | ICD-10-CM | POA: Insufficient documentation

## 2019-05-19 DIAGNOSIS — Z96652 Presence of left artificial knee joint: Secondary | ICD-10-CM | POA: Diagnosis not present

## 2019-05-19 DIAGNOSIS — C7951 Secondary malignant neoplasm of bone: Secondary | ICD-10-CM | POA: Insufficient documentation

## 2019-05-19 DIAGNOSIS — Z809 Family history of malignant neoplasm, unspecified: Secondary | ICD-10-CM | POA: Insufficient documentation

## 2019-05-19 DIAGNOSIS — C50411 Malignant neoplasm of upper-outer quadrant of right female breast: Secondary | ICD-10-CM | POA: Diagnosis not present

## 2019-05-19 DIAGNOSIS — Z21 Asymptomatic human immunodeficiency virus [HIV] infection status: Secondary | ICD-10-CM | POA: Diagnosis not present

## 2019-05-19 DIAGNOSIS — Z801 Family history of malignant neoplasm of trachea, bronchus and lung: Secondary | ICD-10-CM | POA: Insufficient documentation

## 2019-05-19 DIAGNOSIS — I10 Essential (primary) hypertension: Secondary | ICD-10-CM | POA: Insufficient documentation

## 2019-05-19 DIAGNOSIS — C50911 Malignant neoplasm of unspecified site of right female breast: Secondary | ICD-10-CM

## 2019-05-19 LAB — CBC WITH DIFFERENTIAL/PLATELET
Abs Immature Granulocytes: 0.03 10*3/uL (ref 0.00–0.07)
Basophils Absolute: 0 10*3/uL (ref 0.0–0.1)
Basophils Relative: 0 %
Eosinophils Absolute: 0 10*3/uL (ref 0.0–0.5)
Eosinophils Relative: 0 %
HCT: 36.6 % (ref 36.0–46.0)
Hemoglobin: 12.3 g/dL (ref 12.0–15.0)
Immature Granulocytes: 0 %
Lymphocytes Relative: 2 %
Lymphs Abs: 0.2 10*3/uL — ABNORMAL LOW (ref 0.7–4.0)
MCH: 30.9 pg (ref 26.0–34.0)
MCHC: 33.6 g/dL (ref 30.0–36.0)
MCV: 92 fL (ref 80.0–100.0)
Monocytes Absolute: 0.3 10*3/uL (ref 0.1–1.0)
Monocytes Relative: 3 %
Neutro Abs: 10.8 10*3/uL — ABNORMAL HIGH (ref 1.7–7.7)
Neutrophils Relative %: 95 %
Platelets: 86 10*3/uL — ABNORMAL LOW (ref 150–400)
RBC: 3.98 MIL/uL (ref 3.87–5.11)
RDW: 17.2 % — ABNORMAL HIGH (ref 11.5–15.5)
WBC: 11.4 10*3/uL — ABNORMAL HIGH (ref 4.0–10.5)
nRBC: 0 % (ref 0.0–0.2)

## 2019-05-19 LAB — COMPREHENSIVE METABOLIC PANEL
ALT: 35 U/L (ref 0–44)
AST: 18 U/L (ref 15–41)
Albumin: 3.4 g/dL — ABNORMAL LOW (ref 3.5–5.0)
Alkaline Phosphatase: 62 U/L (ref 38–126)
Anion gap: 12 (ref 5–15)
BUN: 90 mg/dL — ABNORMAL HIGH (ref 8–23)
CO2: 21 mmol/L — ABNORMAL LOW (ref 22–32)
Calcium: 9.9 mg/dL (ref 8.9–10.3)
Chloride: 105 mmol/L (ref 98–111)
Creatinine, Ser: 1.81 mg/dL — ABNORMAL HIGH (ref 0.44–1.00)
GFR calc Af Amer: 34 mL/min — ABNORMAL LOW (ref >60)
GFR calc non Af Amer: 29 mL/min — ABNORMAL LOW (ref >60)
Glucose, Bld: 105 mg/dL — ABNORMAL HIGH (ref 70–99)
Potassium: 4.8 mmol/L (ref 3.5–5.1)
Sodium: 138 mmol/L (ref 135–145)
Total Bilirubin: 0.9 mg/dL (ref 0.3–1.2)
Total Protein: 6.6 g/dL (ref 6.5–8.1)

## 2019-05-19 LAB — TSH: TSH: 0.133 u[IU]/mL — ABNORMAL LOW (ref 0.308–3.960)

## 2019-05-19 MED ORDER — HEPARIN SOD (PORK) LOCK FLUSH 100 UNIT/ML IV SOLN
500.0000 [IU] | Freq: Once | INTRAVENOUS | Status: AC
  Administered 2019-05-19: 15:00:00 500 [IU] via INTRAVENOUS
  Filled 2019-05-19: qty 5

## 2019-05-19 MED ORDER — SODIUM CHLORIDE 0.9% FLUSH
10.0000 mL | Freq: Once | INTRAVENOUS | Status: AC
  Administered 2019-05-19: 15:00:00 10 mL via INTRAVENOUS
  Filled 2019-05-19: qty 10

## 2019-05-19 MED ORDER — SODIUM CHLORIDE 0.9 % IV SOLN
INTRAVENOUS | Status: DC
  Administered 2019-05-19: 13:00:00 via INTRAVENOUS
  Filled 2019-05-19 (×2): qty 250

## 2019-05-19 NOTE — Patient Instructions (Signed)
Rehydration, Adult Rehydration is the replacement of body fluids and salts and minerals (electrolytes) that are lost during dehydration. Dehydration is when there is not enough fluid or water in the body. This happens when you lose more fluids than you take in. Common causes of dehydration include:  Vomiting.  Diarrhea.  Excessive sweating, such as from heat exposure or exercise.  Taking medicines that cause the body to lose excess fluid (diuretics).  Impaired kidney function.  Not drinking enough fluid.  Certain illnesses or infections.  Certain poorly controlled long-term (chronic) illnesses, such as diabetes, heart disease, and kidney disease.  Symptoms of mild dehydration may include thirst, dry lips and mouth, dry skin, and dizziness. Symptoms of severe dehydration may include increased heart rate, confusion, fainting, and not urinating. You can rehydrate by drinking certain fluids or getting fluids through an IV tube, as told by your health care provider. What are the risks? Generally, rehydration is safe. However, one problem that can happen is taking in too much fluid (overhydration). This is rare. If overhydration happens, it can cause an electrolyte imbalance, kidney failure, or a decrease in salt (sodium) levels in the body. How to rehydrate Follow instructions from your health care provider for rehydration. The kind of fluid you should drink and the amount you should drink depend on your condition.  If directed by your health care provider, drink an oral rehydration solution (ORS). This is a drink designed to treat dehydration that is found in pharmacies and retail stores. ? Make an ORS by following instructions on the package. ? Start by drinking small amounts, about  cup (120 mL) every 5-10 minutes. ? Slowly increase how much you drink until you have taken the amount recommended by your health care provider.  Drink enough clear fluids to keep your urine clear or pale  yellow. If you were instructed to drink an ORS, finish the ORS first, then start slowly drinking other clear fluids. Drink fluids such as: ? Water. Do not drink only water. Doing that can lead to having too little sodium in your body (hyponatremia). ? Ice chips. ? Fruit juice that you have added water to (diluted juice). ? Low-calorie sports drinks.  If you are severely dehydrated, your health care provider may recommend that you receive fluids through an IV tube in the hospital.  Do not take sodium tablets. Doing that can lead to the condition of having too much sodium in your body (hypernatremia). Eating while you rehydrate Follow instructions from your health care provider about what to eat while you rehydrate. Your health care provider may recommend that you slowly begin eating regular foods in small amounts.  Eat foods that contain a healthy balance of electrolytes, such as bananas, oranges, potatoes, tomatoes, and spinach.  Avoid foods that are greasy or contain a lot of fat or sugar.  In some cases, you may get nutrition through a feeding tube that is passed through your nose and into your stomach (nasogastric tube, or NG tube). This may be done if you have uncontrolled vomiting or diarrhea. Beverages to avoid Certain beverages may make dehydration worse. While you rehydrate, avoid:  Alcohol.  Caffeine.  Drinks that contain a lot of sugar. These include: ? High-calorie sports drinks. ? Fruit juice that is not diluted. ? Soda.  Check nutrition labels to see how much sugar or caffeine a beverage contains. Signs of dehydration recovery You may be recovering from dehydration if:  You are urinating more often than before you started   rehydrating.  Your urine is clear or pale yellow.  Your energy level improves.  You vomit less frequently.  You have diarrhea less frequently.  Your appetite improves or returns to normal.  You feel less dizzy or less light-headed.  Your  skin tone and color start to look more normal. Contact a health care provider if:  You continue to have symptoms of mild dehydration, such as: ? Thirst. ? Dry lips. ? Slightly dry mouth. ? Dry, warm skin. ? Dizziness.  You continue to vomit or have diarrhea. Get help right away if:  You have symptoms of dehydration that get worse.  You feel: ? Confused. ? Weak. ? Like you are going to faint.  You have not urinated in 6-8 hours.  You have very dark urine.  You have trouble breathing.  Your heart rate while sitting still is over 100 beats a minute.  You cannot drink fluids without vomiting.  You have vomiting or diarrhea that: ? Gets worse. ? Does not go away.  You have a fever. This information is not intended to replace advice given to you by your health care provider. Make sure you discuss any questions you have with your health care provider. Document Released: 09/03/2011 Document Revised: 05/24/2017 Document Reviewed: 08/05/2015 Elsevier Patient Education  2020 [employer]  Coronavirus (COVID-19) Are you at risk?  Are you at risk for the Coronavirus (COVID-19)?  To be considered HIGH RISK for Coronavirus (COVID-19), you have to meet the following criteria:  . Traveled to [place], [place], [place], [place] or [place]; or in the [place] to [place], [place], [place], or [place]; and have fever, cough, and shortness of breath within the last 2 weeks of travel OR . Been in close contact with a person diagnosed with COVID-19 within the last 2 weeks and have fever, cough, and shortness of breath . IF YOU DO NOT MEET THESE CRITERIA, YOU ARE CONSIDERED LOW RISK FOR COVID-19.  What to do if you are HIGH RISK for COVID-19?  . If you are having a medical emergency, call 911. . Seek medical care right away. Before you go to a doctor's office, urgent care or emergency department, call ahead and tell them about your recent travel, contact with someone diagnosed  with COVID-19, and your symptoms. You should receive instructions from your physician's office regarding next steps of care.  . When you arrive at healthcare provider, tell the healthcare staff immediately you have returned from visiting [place], [place], [place], [place] or [place]; or traveled in the [place] to [place], [place], [place], or [place]; in the last two weeks or you have been in close contact with a person diagnosed with COVID-19 in the last 2 weeks.   . Tell the health care staff about your symptoms: fever, cough and shortness of breath. . After you have been seen by a medical provider, you will be either: o Tested for (COVID-19) and discharged home on quarantine except to seek medical care if symptoms worsen, and asked to  - Stay home and avoid contact with others until you get your results (4-5 days)  - Avoid travel on public transportation if possible (such as bus, train, or airplane) or o Sent to the Emergency Department by EMS for evaluation, COVID-19 testing, and possible admission depending on your condition and test results.  What to do if you are LOW RISK for COVID-19?  Reduce your risk of any infection by using the same precautions   used for avoiding the common cold or flu:  . Wash your hands often with soap and warm water for at least 20 seconds.  If soap and water are not readily available, use an alcohol-based hand sanitizer with at least 60% alcohol.  . If coughing or sneezing, cover your mouth and nose by coughing or sneezing into the elbow areas of your shirt or coat, into a tissue or into your sleeve (not your hands). . Avoid shaking hands with others and consider head nods or verbal greetings only. . Avoid touching your eyes, nose, or mouth with unwashed hands.  . Avoid close contact with people who are sick. . Avoid places or events with large numbers of people in one location, like concerts or sporting events. . Carefully consider travel plans you have  or are making. . If you are planning any travel outside or inside the [place], visit the CDC's Travelers' Health webpage for the latest health notices. . If you have some symptoms but not all symptoms, continue to monitor at home and seek medical attention if your symptoms worsen. . If you are having a medical emergency, call 911.   ADDITIONAL HEALTHCARE OPTIONS FOR PATIENTS  [facility] / e-Visit: [email or web address]         [facility]: [phone]  [facility]: [phone]                   [facility]: [phone]   

## 2019-05-20 NOTE — Progress Notes (Incomplete)
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  Intent: Palliative  Radiation Treatment Dates: 04/30/2019 through 05/13/2019 Site Technique Total Dose (Gy) Dose per Fx (Gy) Completed Fx Beam Energies  Brain: Brain Complex 30/30 3 10/10 6X   Narrative: The patient tolerated radiation therapy relatively well. She reported some moderate fatigue towards the end of treatment. Denied headache, visual changes, or hearing changes throughout. She was on Dexamethasone during treatment and was prescribed Decadron taper.  Plan: The patient will follow-up with medical oncology in one week.  ________________________________________________   Blair Promise, PhD, MD  This document serves as a record of services personally performed by Gery Pray, MD. It was created on his behalf by Clerance Lav, a trained medical scribe. The creation of this record is based on the scribe's personal observations and the provider's statements to them. This document has been checked and approved by the attending provider.

## 2019-05-22 MED FILL — JULUCA 50-25 MG TAB: 50-25 | 30 days supply | Qty: 30 | Fill #1

## 2019-05-27 ENCOUNTER — Ambulatory Visit: Payer: POS | Admitting: Adult Health

## 2019-05-27 ENCOUNTER — Encounter (HOSPITAL_COMMUNITY): Payer: Self-pay | Admitting: Emergency Medicine

## 2019-05-27 ENCOUNTER — Other Ambulatory Visit: Payer: Self-pay

## 2019-05-27 ENCOUNTER — Other Ambulatory Visit: Payer: POS

## 2019-05-27 ENCOUNTER — Inpatient Hospital Stay (HOSPITAL_COMMUNITY)
Admission: EM | Admit: 2019-05-27 | Discharge: 2019-05-31 | DRG: 683 | Disposition: A | Discharge status: Home Health | Payer: Medicare HMO | Attending: Internal Medicine | Admitting: Internal Medicine

## 2019-05-27 DIAGNOSIS — Z66 Do not resuscitate: Secondary | ICD-10-CM | POA: Diagnosis not present

## 2019-05-27 DIAGNOSIS — D638 Anemia in other chronic diseases classified elsewhere: Secondary | ICD-10-CM | POA: Diagnosis not present

## 2019-05-27 DIAGNOSIS — N183 Chronic kidney disease, stage 3 unspecified: Secondary | ICD-10-CM | POA: Diagnosis not present

## 2019-05-27 DIAGNOSIS — Z8261 Family history of arthritis: Secondary | ICD-10-CM

## 2019-05-27 DIAGNOSIS — I129 Hypertensive chronic kidney disease with stage 1 through stage 4 chronic kidney disease, or unspecified chronic kidney disease: Secondary | ICD-10-CM | POA: Diagnosis present

## 2019-05-27 DIAGNOSIS — L98491 Non-pressure chronic ulcer of skin of other sites limited to breakdown of skin: Secondary | ICD-10-CM | POA: Diagnosis present

## 2019-05-27 DIAGNOSIS — C7931 Secondary malignant neoplasm of brain: Secondary | ICD-10-CM | POA: Diagnosis present

## 2019-05-27 DIAGNOSIS — D696 Thrombocytopenia, unspecified: Secondary | ICD-10-CM | POA: Diagnosis not present

## 2019-05-27 DIAGNOSIS — E861 Hypovolemia: Secondary | ICD-10-CM | POA: Diagnosis present

## 2019-05-27 DIAGNOSIS — I959 Hypotension, unspecified: Secondary | ICD-10-CM | POA: Diagnosis not present

## 2019-05-27 DIAGNOSIS — B20 Human immunodeficiency virus [HIV] disease: Secondary | ICD-10-CM | POA: Diagnosis present

## 2019-05-27 DIAGNOSIS — T451X5A Adverse effect of antineoplastic and immunosuppressive drugs, initial encounter: Secondary | ICD-10-CM | POA: Diagnosis present

## 2019-05-27 DIAGNOSIS — Z20828 Contact with and (suspected) exposure to other viral communicable diseases: Secondary | ICD-10-CM | POA: Diagnosis present

## 2019-05-27 DIAGNOSIS — Z823 Family history of stroke: Secondary | ICD-10-CM

## 2019-05-27 DIAGNOSIS — Z171 Estrogen receptor negative status [ER-]: Secondary | ICD-10-CM

## 2019-05-27 DIAGNOSIS — D631 Anemia in chronic kidney disease: Secondary | ICD-10-CM | POA: Diagnosis present

## 2019-05-27 DIAGNOSIS — Z96652 Presence of left artificial knee joint: Secondary | ICD-10-CM | POA: Diagnosis present

## 2019-05-27 DIAGNOSIS — E039 Hypothyroidism, unspecified: Secondary | ICD-10-CM | POA: Diagnosis present

## 2019-05-27 DIAGNOSIS — Y929 Unspecified place or not applicable: Secondary | ICD-10-CM | POA: Diagnosis not present

## 2019-05-27 DIAGNOSIS — L98411 Non-pressure chronic ulcer of buttock limited to breakdown of skin: Secondary | ICD-10-CM | POA: Diagnosis present

## 2019-05-27 DIAGNOSIS — Z888 Allergy status to other drugs, medicaments and biological substances status: Secondary | ICD-10-CM

## 2019-05-27 DIAGNOSIS — N390 Urinary tract infection, site not specified: Secondary | ICD-10-CM | POA: Diagnosis present

## 2019-05-27 DIAGNOSIS — Z9011 Acquired absence of right breast and nipple: Secondary | ICD-10-CM

## 2019-05-27 DIAGNOSIS — E872 Acidosis: Secondary | ICD-10-CM | POA: Diagnosis present

## 2019-05-27 DIAGNOSIS — E1122 Type 2 diabetes mellitus with diabetic chronic kidney disease: Secondary | ICD-10-CM | POA: Diagnosis present

## 2019-05-27 DIAGNOSIS — E875 Hyperkalemia: Secondary | ICD-10-CM | POA: Diagnosis present

## 2019-05-27 DIAGNOSIS — Z9221 Personal history of antineoplastic chemotherapy: Secondary | ICD-10-CM | POA: Diagnosis not present

## 2019-05-27 DIAGNOSIS — Z853 Personal history of malignant neoplasm of breast: Secondary | ICD-10-CM

## 2019-05-27 DIAGNOSIS — D6959 Other secondary thrombocytopenia: Secondary | ICD-10-CM | POA: Diagnosis present

## 2019-05-27 DIAGNOSIS — Z833 Family history of diabetes mellitus: Secondary | ICD-10-CM

## 2019-05-27 DIAGNOSIS — Z807 Family history of other malignant neoplasms of lymphoid, hematopoietic and related tissues: Secondary | ICD-10-CM

## 2019-05-27 DIAGNOSIS — Z923 Personal history of irradiation: Secondary | ICD-10-CM

## 2019-05-27 DIAGNOSIS — M6281 Muscle weakness (generalized): Secondary | ICD-10-CM | POA: Diagnosis not present

## 2019-05-27 DIAGNOSIS — Z825 Family history of asthma and other chronic lower respiratory diseases: Secondary | ICD-10-CM

## 2019-05-27 DIAGNOSIS — E860 Dehydration: Secondary | ICD-10-CM | POA: Diagnosis present

## 2019-05-27 DIAGNOSIS — E782 Mixed hyperlipidemia: Secondary | ICD-10-CM | POA: Diagnosis present

## 2019-05-27 DIAGNOSIS — Z79890 Hormone replacement therapy (postmenopausal): Secondary | ICD-10-CM

## 2019-05-27 DIAGNOSIS — R52 Pain, unspecified: Secondary | ICD-10-CM | POA: Diagnosis not present

## 2019-05-27 DIAGNOSIS — N179 Acute kidney failure, unspecified: Principal | ICD-10-CM | POA: Diagnosis present

## 2019-05-27 DIAGNOSIS — Z8249 Family history of ischemic heart disease and other diseases of the circulatory system: Secondary | ICD-10-CM

## 2019-05-27 DIAGNOSIS — Z801 Family history of malignant neoplasm of trachea, bronchus and lung: Secondary | ICD-10-CM

## 2019-05-27 DIAGNOSIS — Z79899 Other long term (current) drug therapy: Secondary | ICD-10-CM

## 2019-05-27 DIAGNOSIS — L304 Erythema intertrigo: Secondary | ICD-10-CM | POA: Diagnosis present

## 2019-05-27 DIAGNOSIS — Z87442 Personal history of urinary calculi: Secondary | ICD-10-CM | POA: Diagnosis not present

## 2019-05-27 DIAGNOSIS — Z841 Family history of disorders of kidney and ureter: Secondary | ICD-10-CM

## 2019-05-27 DIAGNOSIS — C50911 Malignant neoplasm of unspecified site of right female breast: Secondary | ICD-10-CM | POA: Diagnosis present

## 2019-05-27 DIAGNOSIS — I9589 Other hypotension: Secondary | ICD-10-CM | POA: Diagnosis not present

## 2019-05-27 DIAGNOSIS — Z79891 Long term (current) use of opiate analgesic: Secondary | ICD-10-CM

## 2019-05-27 DIAGNOSIS — R531 Weakness: Secondary | ICD-10-CM | POA: Diagnosis not present

## 2019-05-27 NOTE — ED Triage Notes (Signed)
Patient arrived with EMS from home family concerned about patient's worsening decubitus ulcer at buttocks / generalized weakness , patient has a history of metastatic breast CA .

## 2019-05-27 NOTE — ED Notes (Signed)
Patient daughter Kayla Price [phone] would like call when they can come back

## 2019-05-28 ENCOUNTER — Observation Stay (HOSPITAL_COMMUNITY): Payer: Medicare HMO

## 2019-05-28 ENCOUNTER — Encounter (HOSPITAL_COMMUNITY): Payer: Self-pay

## 2019-05-28 ENCOUNTER — Other Ambulatory Visit: Payer: Self-pay

## 2019-05-28 DIAGNOSIS — N189 Chronic kidney disease, unspecified: Secondary | ICD-10-CM | POA: Diagnosis present

## 2019-05-28 DIAGNOSIS — I959 Hypotension, unspecified: Secondary | ICD-10-CM | POA: Diagnosis present

## 2019-05-28 DIAGNOSIS — N183 Chronic kidney disease, stage 3 unspecified: Secondary | ICD-10-CM | POA: Diagnosis present

## 2019-05-28 DIAGNOSIS — D6959 Other secondary thrombocytopenia: Secondary | ICD-10-CM | POA: Diagnosis present

## 2019-05-28 DIAGNOSIS — C7931 Secondary malignant neoplasm of brain: Secondary | ICD-10-CM | POA: Diagnosis present

## 2019-05-28 DIAGNOSIS — L304 Erythema intertrigo: Secondary | ICD-10-CM | POA: Diagnosis present

## 2019-05-28 DIAGNOSIS — E861 Hypovolemia: Secondary | ICD-10-CM | POA: Diagnosis not present

## 2019-05-28 DIAGNOSIS — Z923 Personal history of irradiation: Secondary | ICD-10-CM | POA: Diagnosis not present

## 2019-05-28 DIAGNOSIS — N179 Acute kidney failure, unspecified: Secondary | ICD-10-CM | POA: Diagnosis present

## 2019-05-28 DIAGNOSIS — B20 Human immunodeficiency virus [HIV] disease: Secondary | ICD-10-CM | POA: Diagnosis present

## 2019-05-28 DIAGNOSIS — T451X5A Adverse effect of antineoplastic and immunosuppressive drugs, initial encounter: Secondary | ICD-10-CM | POA: Diagnosis present

## 2019-05-28 DIAGNOSIS — D631 Anemia in chronic kidney disease: Secondary | ICD-10-CM | POA: Diagnosis present

## 2019-05-28 DIAGNOSIS — E875 Hyperkalemia: Secondary | ICD-10-CM

## 2019-05-28 DIAGNOSIS — N390 Urinary tract infection, site not specified: Secondary | ICD-10-CM

## 2019-05-28 DIAGNOSIS — E782 Mixed hyperlipidemia: Secondary | ICD-10-CM | POA: Diagnosis present

## 2019-05-28 DIAGNOSIS — E1122 Type 2 diabetes mellitus with diabetic chronic kidney disease: Secondary | ICD-10-CM | POA: Diagnosis present

## 2019-05-28 DIAGNOSIS — I9589 Other hypotension: Secondary | ICD-10-CM | POA: Diagnosis not present

## 2019-05-28 DIAGNOSIS — E039 Hypothyroidism, unspecified: Secondary | ICD-10-CM | POA: Diagnosis present

## 2019-05-28 DIAGNOSIS — E860 Dehydration: Secondary | ICD-10-CM | POA: Diagnosis present

## 2019-05-28 DIAGNOSIS — Z853 Personal history of malignant neoplasm of breast: Secondary | ICD-10-CM | POA: Diagnosis not present

## 2019-05-28 DIAGNOSIS — Z9221 Personal history of antineoplastic chemotherapy: Secondary | ICD-10-CM | POA: Diagnosis not present

## 2019-05-28 DIAGNOSIS — Z66 Do not resuscitate: Secondary | ICD-10-CM | POA: Diagnosis present

## 2019-05-28 DIAGNOSIS — Z801 Family history of malignant neoplasm of trachea, bronchus and lung: Secondary | ICD-10-CM | POA: Diagnosis not present

## 2019-05-28 DIAGNOSIS — Y929 Unspecified place or not applicable: Secondary | ICD-10-CM | POA: Diagnosis not present

## 2019-05-28 DIAGNOSIS — Z20828 Contact with and (suspected) exposure to other viral communicable diseases: Secondary | ICD-10-CM | POA: Diagnosis present

## 2019-05-28 DIAGNOSIS — E872 Acidosis: Secondary | ICD-10-CM | POA: Diagnosis present

## 2019-05-28 DIAGNOSIS — I129 Hypertensive chronic kidney disease with stage 1 through stage 4 chronic kidney disease, or unspecified chronic kidney disease: Secondary | ICD-10-CM | POA: Diagnosis present

## 2019-05-28 DIAGNOSIS — Z87442 Personal history of urinary calculi: Secondary | ICD-10-CM | POA: Diagnosis not present

## 2019-05-28 DIAGNOSIS — Z807 Family history of other malignant neoplasms of lymphoid, hematopoietic and related tissues: Secondary | ICD-10-CM | POA: Diagnosis not present

## 2019-05-28 LAB — URINALYSIS, ROUTINE W REFLEX MICROSCOPIC
Bilirubin Urine: NEGATIVE
Glucose, UA: NEGATIVE mg/dL
Ketones, ur: NEGATIVE mg/dL
Nitrite: NEGATIVE
Protein, ur: NEGATIVE mg/dL
Specific Gravity, Urine: 1.014 (ref 1.005–1.030)
pH: 5 (ref 5.0–8.0)

## 2019-05-28 LAB — CBC WITH DIFFERENTIAL/PLATELET
Abs Immature Granulocytes: 0.01 10*3/uL (ref 0.00–0.07)
Basophils Absolute: 0 10*3/uL (ref 0.0–0.1)
Basophils Relative: 0 %
Eosinophils Absolute: 0 10*3/uL (ref 0.0–0.5)
Eosinophils Relative: 1 %
HCT: 34.4 % — ABNORMAL LOW (ref 36.0–46.0)
Hemoglobin: 11.2 g/dL — ABNORMAL LOW (ref 12.0–15.0)
Immature Granulocytes: 0 %
Lymphocytes Relative: 5 %
Lymphs Abs: 0.3 10*3/uL — ABNORMAL LOW (ref 0.7–4.0)
MCH: 31.1 pg (ref 26.0–34.0)
MCHC: 32.6 g/dL (ref 30.0–36.0)
MCV: 95.6 fL (ref 80.0–100.0)
Monocytes Absolute: 0.1 10*3/uL (ref 0.1–1.0)
Monocytes Relative: 2 %
Neutro Abs: 5.9 10*3/uL (ref 1.7–7.7)
Neutrophils Relative %: 92 %
Platelets: 54 10*3/uL — ABNORMAL LOW (ref 150–400)
RBC: 3.6 MIL/uL — ABNORMAL LOW (ref 3.87–5.11)
RDW: 17.5 % — ABNORMAL HIGH (ref 11.5–15.5)
WBC: 6.4 10*3/uL (ref 4.0–10.5)
nRBC: 0 % (ref 0.0–0.2)

## 2019-05-28 LAB — COMPREHENSIVE METABOLIC PANEL
ALT: 46 U/L — ABNORMAL HIGH (ref 0–44)
AST: 26 U/L (ref 15–41)
Albumin: 3.3 g/dL — ABNORMAL LOW (ref 3.5–5.0)
Alkaline Phosphatase: 48 U/L (ref 38–126)
Anion gap: 7 (ref 5–15)
BUN: 147 mg/dL — ABNORMAL HIGH (ref 8–23)
CO2: 19 mmol/L — ABNORMAL LOW (ref 22–32)
Calcium: 9.3 mg/dL (ref 8.9–10.3)
Chloride: 112 mmol/L — ABNORMAL HIGH (ref 98–111)
Creatinine, Ser: 3.14 mg/dL — ABNORMAL HIGH (ref 0.44–1.00)
GFR calc Af Amer: 17 mL/min — ABNORMAL LOW (ref >60)
GFR calc non Af Amer: 15 mL/min — ABNORMAL LOW (ref >60)
Glucose, Bld: 112 mg/dL — ABNORMAL HIGH (ref 70–99)
Potassium: 5.4 mmol/L — ABNORMAL HIGH (ref 3.5–5.1)
Sodium: 138 mmol/L (ref 135–145)
Total Bilirubin: 1 mg/dL (ref 0.3–1.2)
Total Protein: 6.2 g/dL — ABNORMAL LOW (ref 6.5–8.1)

## 2019-05-28 LAB — BASIC METABOLIC PANEL
Anion gap: 6 (ref 5–15)
BUN: 96 mg/dL — ABNORMAL HIGH (ref 8–23)
CO2: 19 mmol/L — ABNORMAL LOW (ref 22–32)
Calcium: 8.5 mg/dL — ABNORMAL LOW (ref 8.9–10.3)
Chloride: 118 mmol/L — ABNORMAL HIGH (ref 98–111)
Creatinine, Ser: 1.88 mg/dL — ABNORMAL HIGH (ref 0.44–1.00)
GFR calc Af Amer: 32 mL/min — ABNORMAL LOW (ref >60)
GFR calc non Af Amer: 28 mL/min — ABNORMAL LOW (ref >60)
Glucose, Bld: 107 mg/dL — ABNORMAL HIGH (ref 70–99)
Potassium: 4.2 mmol/L (ref 3.5–5.1)
Sodium: 143 mmol/L (ref 135–145)

## 2019-05-28 LAB — LACTIC ACID, PLASMA
Lactic Acid, Venous: 2 mmol/L (ref 0.5–1.9)
Lactic Acid, Venous: 2 mmol/L (ref 0.5–1.9)
Lactic Acid, Venous: 2 mmol/L (ref 0.5–1.9)
Lactic Acid, Venous: 1.5 mmol/L (ref 0.5–1.9)

## 2019-05-28 LAB — CORTISOL: Cortisol, Plasma: 19.2 ug/dL

## 2019-05-28 LAB — SARS CORONAVIRUS 2 (TAT 6-24 HRS): SARS Coronavirus 2: NEGATIVE

## 2019-05-28 LAB — SODIUM, URINE, RANDOM: Sodium, Ur: 23 mmol/L

## 2019-05-28 LAB — CREATININE, URINE, RANDOM: Creatinine, Urine: 65.83 mg/dL

## 2019-05-28 MED ORDER — SODIUM CHLORIDE 0.9 % IV BOLUS: 1000.0000 mL | Freq: Once | INTRAVENOUS | Status: DC

## 2019-05-28 MED ORDER — SODIUM CHLORIDE 0.9 % IV SOLN: INTRAVENOUS | Status: AC

## 2019-05-28 MED ORDER — SODIUM CHLORIDE 0.9 % IV BOLUS
1000.0000 mL | Freq: Once | INTRAVENOUS | Status: AC
  Administered 2019-05-28: 1000 mL via INTRAVENOUS

## 2019-05-28 MED ORDER — ACETAMINOPHEN 650 MG RE SUPP: 650.0000 mg | Freq: Four times a day (QID) | RECTAL | Status: DC | PRN

## 2019-05-28 MED ORDER — HYDROCODONE-ACETAMINOPHEN 7.5-325 MG/15ML PO SOLN
10.0000 mL | Freq: Once | ORAL | Status: AC
  Administered 2019-05-28: 10 mL via ORAL
  Filled 2019-05-28: qty 15

## 2019-05-28 MED ORDER — HYDROCORTISONE NA SUCCINATE PF 100 MG IJ SOLR
50.0000 mg | INTRAMUSCULAR | Status: AC
  Administered 2019-05-28: 50 mg via INTRAVENOUS
  Filled 2019-05-28: qty 2

## 2019-05-28 MED ORDER — LIDOCAINE 4 % EX CREA
TOPICAL_CREAM | Freq: Once | CUTANEOUS | Status: DC
  Filled 2019-05-28: qty 5

## 2019-05-28 MED ORDER — HYDROCODONE-ACETAMINOPHEN 5-325 MG PO TABS
1.0000 | ORAL_TABLET | ORAL | Status: DC | PRN
  Administered 2019-05-28: 1 via ORAL
  Administered 2019-05-29 (×2): 2 via ORAL
  Administered 2019-05-29 – 2019-05-31 (×4): 1 via ORAL
  Filled 2019-05-28 (×4): qty 1
  Filled 2019-05-28: qty 2
  Filled 2019-05-28: qty 1
  Filled 2019-05-28: qty 2

## 2019-05-28 MED ORDER — SODIUM CHLORIDE 0.9 % IV SOLN
Freq: Once | INTRAVENOUS | Status: AC
  Administered 2019-05-28: 05:00:00 via INTRAVENOUS

## 2019-05-28 MED ORDER — ACETAMINOPHEN 325 MG PO TABS: 650.0000 mg | ORAL_TABLET | Freq: Four times a day (QID) | ORAL | Status: DC | PRN

## 2019-05-28 MED ORDER — SODIUM CHLORIDE 0.9 % IV SOLN
1.0000 g | Freq: Every day | INTRAVENOUS | Status: DC
  Administered 2019-05-28 – 2019-05-31 (×4): 1 g via INTRAVENOUS
  Filled 2019-05-28: qty 10
  Filled 2019-05-28 (×3): qty 1

## 2019-05-28 MED ORDER — NYSTATIN 100000 UNIT/GM EX POWD
Freq: Three times a day (TID) | CUTANEOUS | Status: DC
  Administered 2019-05-29 – 2019-05-30 (×6): via TOPICAL
  Filled 2019-05-28: qty 15

## 2019-05-28 NOTE — Consult Note (Signed)
NAME:  Kayla Price, MRN:  [id number], DOB:  [birth year], LOS: 0 ADMISSION DATE:  05/27/2019, CONSULTATION DATE:  05/28/2019 REFERRING MD:  TRH, CHIEF COMPLAINT:  Hypotension   Brief History   63 yo F with metastatic breast cancer, HIV on therapy (last CD4 475) and sacral ulcers who presented to the ED for worsening of sacral ulcers, found to be hypotensive and with an AKI.    History of present illness   Kayla Price presented to the ED last night for concern for worsening sacral ulcers, she also noted that she has not been eating or drinking much over the last few weeks- occasionally has some difficulty swallowing, and also does not want to drink so much because she frequently has to go to the restroom.  She denies any increased shortness of breath, dysuria, abodminal pain, and denies fevers.  She has been taking her ARB.  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    Past Medical History   Past Medical History:  Diagnosis Date   Alopecia areata 11/28/2009   Bell's palsy    Cancer ([place]) 2005   Breast cancer   chemotherapy and radiation   CKD (chronic kidney disease) stage 3, GFR 30-59 ml/min 06/08/2015   Dry eye syndrome    Family history of lung cancer    Family history of non-Hodgkin's lymphoma    Fasting hyperglycemia    Gestational diabetes    2001   HIP FRACTURE, RIGHT 05/06/2008   History of kidney stones    HIV DISEASE 03/27/2006   HYPERLIPIDEMIA, MIXED 12/15/2007   HYPERTENSION 03/27/2006   HYPOTHYROIDISM, POST-RADIATION 06/28/2008   MENORRHAGIA, POSTMENOPAUSAL 02/03/2009   Osteoarthritis of left knee 06/08/2015   OSTEOARTHROSIS, LOCAL, SCND, UNSPC SITE 04/07/2007   Personal history of chemotherapy 11/2018   PVD 04/07/2007   Tinea capitis    TRIGGER FINGER 05/06/2008   Unspecified vitamin D deficiency 08/06/2007     Significant Hospital Events     Consults:   PCCM  Procedures:  none  Significant Diagnostic Tests:  WBC 6, not neutropenic U/A with moderate leuks  Micro Data:  Ucx--> BCx x2-->  Antimicrobials:  ceftriaxone  Interim history/subjective:    Objective   Blood pressure 104/70, pulse 81, temperature 97.9 F (36.6 C), temperature source Oral, resp. rate 15, last menstrual period 11/06/2003, SpO2 98 %.        Intake/Output Summary (Last 24 hours) at 05/28/2019 2012 Last data filed at 05/28/2019 1805 Gross per 24 hour  Intake 3100 ml  Output --  Net 3100 ml   There were no vitals filed for this visit.  Examination: General: no acute distress, talkative HENT: unremarkable Lungs: clear bilaterally Cardiovascular: RRR, no m/r/g Abdomen: soft, nontender Extremities: 1+ pitting edema bilaterally Neuro: no focal deficits GU: wnl  Resolved Hospital Problem list     Assessment & Plan:  63 yo F presenting with sacral ulcer pain, found to be hypotensive and with an AKI, likely due to hypovolemia.  Critical care was consulted for possible peripheral pressor management.    # Hypotension:  Likely from hypovolemia from poor PO intake in the setting of poor reserve from metastatic cancer.  Sepsis is in the ddx, but less likely without leukocytosis or fever.  After 3L IVFs, her BP was maintaining above 629 systolic, not needing pressors.  Mentating well and at baseline.   - if MAP  is persistently below 65, ok to start peripheral phenylephrine drip, titratable - agree with hydrocortisone 38m x1 at least since she could be in a state of stress and is currently on decadron taper.   - agree with ceftriaxone for possible UTI now, follow up cultures  # AKI: improved, likely prerenal due to hypovolemia and ARB use.  IV resuscitation as above.    Best practice:  Diet: regular Pain/Anxiety/Delirium protocol (if indicated): n/a VAP protocol (if indicated): n/a DVT prophylaxis: SCDs GI prophylaxis: n/a Glucose control:  n/a Mobility: PT when able Code Status: DNR Disposition: [facility] admission  Labs   CBC: Recent Labs  Lab 05/27/19 2327  WBC 6.4  NEUTROABS 5.9  HGB 11.2*  HCT 34.4*  MCV 95.6  PLT 54*    Basic Metabolic Panel: Recent Labs  Lab 05/27/19 2327 05/28/19 1822  NA 138 143  K 5.4* 4.2  CL 112* 118*  CO2 19* 19*  GLUCOSE 112* 107*  BUN 147* 96*  CREATININE 3.14* 1.88*  CALCIUM 9.3 8.5*   GFR: Estimated Creatinine Clearance: 34.9 mL/min (A) (by C-G formula based on SCr of 1.88 mg/dL (H)). Recent Labs  Lab 05/27/19 2327 05/28/19 0224 05/28/19 1822  WBC 6.4  --   --   LATICACIDVEN 2.0* 2.0* 2.0*    Liver Function Tests: Recent Labs  Lab 05/27/19 2327  AST 26  ALT 46*  ALKPHOS 48  BILITOT 1.0  PROT 6.2*  ALBUMIN 3.3*   No results for input(s): LIPASE, AMYLASE in the last 168 hours. No results for input(s): AMMONIA in the last 168 hours.  ABG    Component Value Date/Time   PHART 7.378 05/31/2018 0623   PCO2ART 43.7 05/31/2018 0623   PO2ART 84.0 05/31/2018 0623   HCO3 25.8 05/31/2018 0623   TCO2 27 05/31/2018 0623   O2SAT 96.0 05/31/2018 0623     Coagulation Profile: No results for input(s): INR, PROTIME in the last 168 hours.  Cardiac Enzymes: No results for input(s): CKTOTAL, CKMB, CKMBINDEX, TROPONINI in the last 168 hours.  HbA1C: Hemoglobin A1C  Date/Time Value Ref Range Status  03/07/2019 6.1  Final   Hgb A1c MFr Bld  Date/Time Value Ref Range Status  04/18/2018 09:32 AM 5.3 <5.7 % of total Hgb Final    Comment:    For the purpose of screening for the presence of diabetes: . <5.7%       Consistent with the absence of diabetes 5.7-6.4%    Consistent with increased risk for diabetes             (prediabetes) > or =6.5%  Consistent with diabetes . This assay result is consistent with a decreased risk of diabetes. . Currently, no consensus exists regarding use of hemoglobin A1c for diagnosis of diabetes in  children. . According to American Diabetes Association (ADA) guidelines, hemoglobin A1c <7.0% represents optimal control in non-pregnant diabetic patients. Different metrics may apply to specific patient populations.  Standards of Medical Care in Diabetes(ADA). .   05/28/2016 01:40 PM 5.2 <5.7 % Final    Comment:      For the purpose of screening for the presence of diabetes:   <5.7%       Consistent with the absence of diabetes 5.7-6.4 %   Consistent with increased risk for diabetes (prediabetes) >=6.5 %     Consistent with diabetes   This assay result is consistent with a decreased risk of diabetes.   Currently, no consensus exists regarding use of hemoglobin A1c  for diagnosis of diabetes in children.   According to American Diabetes Association (ADA) guidelines, hemoglobin A1c <7.0% represents optimal control in non-pregnant diabetic patients. Different metrics may apply to specific patient populations. Standards of Medical Care in Diabetes (ADA).       CBG: No results for input(s): GLUCAP in the last 168 hours.  Review of Systems:   ROS negative except for HPI.    Past Medical History  She,  has a past medical history of Alopecia areata (11/28/2009), Bell's palsy, Cancer ([place]) (2005), CKD (chronic kidney disease) stage 3, GFR 30-59 ml/min (06/08/2015), Dry eye syndrome, Family history of lung cancer, Family history of non-Hodgkin's lymphoma, Fasting hyperglycemia, Gestational diabetes, HIP FRACTURE, RIGHT (05/06/2008), History of kidney stones, HIV DISEASE (03/27/2006), HYPERLIPIDEMIA, MIXED (12/15/2007), HYPERTENSION (03/27/2006), HYPOTHYROIDISM, POST-RADIATION (06/28/2008), MENORRHAGIA, POSTMENOPAUSAL (02/03/2009), Osteoarthritis of left knee (06/08/2015), OSTEOARTHROSIS, LOCAL, SCND, UNSPC SITE (04/07/2007), Personal history of chemotherapy (11/2018), PVD (04/07/2007), Tinea capitis, TRIGGER FINGER (05/06/2008), and Unspecified vitamin D deficiency (08/06/2007).   Surgical  History    Past Surgical History:  Procedure Laterality Date   BREAST LUMPECTOMY Right    2005   CHEST TUBE INSERTION Right 05/30/2018   Procedure: INSERTION PLEURAL DRAINAGE CATHETER;  Surgeon: Grace Isaac, MD;  Location: [facility];  Service: Thoracic;  Laterality: Right;   COLONOSCOPY     COLONOSCOPY WITH ESOPHAGOGASTRODUODENOSCOPY (EGD)     ENDOBRONCHIAL ULTRASOUND Bilateral 05/15/2018   Procedure: ENDOBRONCHIAL ULTRASOUND;  Surgeon: Collene Gobble, MD;  Location: [facility];  Service: Cardiopulmonary;  Laterality: Bilateral;   FINE NEEDLE ASPIRATION BIOPSY  05/15/2018   Procedure: FINE NEEDLE ASPIRATION BIOPSY;  Surgeon: Collene Gobble, MD;  Location: [facility];  Service: Cardiopulmonary;;   FLEXIBLE BRONCHOSCOPY  05/15/2018   Procedure: FLEXIBLE BRONCHOSCOPY;  Surgeon: Collene Gobble, MD;  Location: [facility];  Service: Cardiopulmonary;;   HYSTEROSCOPY  2006   IR IMAGING GUIDED PORT INSERTION  07/14/2018   IR THORACENTESIS ASP PLEURAL SPACE W/IMG GUIDE  04/14/2018   IR THORACENTESIS ASP PLEURAL SPACE W/IMG GUIDE  04/28/2018   KNEE ARTHROSCOPY Left 2002   LYMPH NODE DISSECTION  2005   MASTECTOMY Right    MASTECTOMY MODIFIED RADICAL Right 11/05/2016   Procedure: RIGHT MASTECTOMY MODIFIED RADICAL;  Surgeon: Fanny Skates, MD;  Location: [facility];  Service: General;  Laterality: Right;   MODIFIED RADICAL MASTECTOMY Right 11/05/2016   placement   of port-a-cath     PLEURAL BIOPSY Right 05/30/2018   Procedure: PLEURAL BIOPSY;  Surgeon: Grace Isaac, MD;  Location: [facility];  Service: Thoracic;  Laterality: Right;   PLEURAL EFFUSION DRAINAGE Right 05/30/2018   Procedure: DRAINAGE OF PLEURAL EFFUSION;  Surgeon: Grace Isaac, MD;  Location: [facility];  Service: Thoracic;  Laterality: Right;   PORT-A-CATH REMOVAL  2006   insertion 2005   PORT-A-CATH REMOVAL N/A 03/29/2017   Procedure: REMOVAL PORT-A-CATH;  Surgeon: Fanny Skates, MD;  Location: [facility];   Service: General;  Laterality: N/A;   PORTACATH PLACEMENT Right 11/05/2016   Procedure: INSERTION PORT-A-CATH WITH ULTRA SOUND;  Surgeon: Fanny Skates, MD;  Location: [facility];  Service: General;  Laterality: Right;   removal of port a cath  12/2014   TALC PLEURODESIS Right 05/30/2018   Procedure: Pietro Cassis;  Surgeon: Grace Isaac, MD;  Location: [facility];  Service: Thoracic;  Laterality: Right;   THYROID SURGERY  2009   Ablation    TOTAL KNEE ARTHROPLASTY Left 02/06/2016   Procedure: TOTAL KNEE ARTHROPLASTY;  Surgeon: Frederik Pear,  MD;  Location: [facility];  Service: Orthopedics;  Laterality: Left;   TUBAL LIGATION     VIDEO ASSISTED THORACOSCOPY Right 05/30/2018   Procedure: VIDEO ASSISTED THORACOSCOPY;  Surgeon: Grace Isaac, MD;  Location: [facility];  Service: Thoracic;  Laterality: Right;     Social History   reports that she has never smoked. She has never used smokeless tobacco. She reports that she does not drink alcohol or use drugs.   Family History   Her family history includes Arthritis in her father; COPD in her sister; Cancer in her cousin; Diabetes in her mother and sister; Heart attack in her brother; Heart disease in her father; Heart failure in her father; Kidney disease in her mother; Liver disease in her sister; Lung cancer in her paternal uncle; Non-Hodgkin's lymphoma (age of onset: 61) in her cousin; Sarcoidosis in her sister; Stroke in her brother, mother, and sister.   Allergies Allergies  Allergen Reactions   Lisinopril Anaphylaxis and Swelling    Swelling of tongue and mouth 11/05/16- tolerates Olmesartan   Pepcid [Famotidine] Other (See Comments)    PPI H2, BLOCKERS LOWER GASTRIC PH WHICH WOULD LEAD TO SUBTHERAPEUTIC RILPIVIRINE LEVELS AND POTENTIAL VIROLOGICAL FAILURE WITH RESISTANCE   Prilosec [Omeprazole] Other (See Comments)    PPI H2, BLOCKERS LOWER GASTRIC PH WHICH WOULD LEAD TO SUBTHERAPEUTIC RILPIVIRINE LEVELS AND POTENTIAL VIROLOGICAL  FAILURE WITH RESISTANCE   Tums [Calcium Carbonate Antacid] Other (See Comments)    TUMS ANTACIDS CAN LOWER GASTRIC PH WHICH COULD  LEAD TO SUBTHERAPEUTIC RILPIVIRINE LEVELS AND POTENTIAL VIROLOGICAL FAILURE WITH RESISTANCE TUMS CAN BE GIVEN BUT NEED CONSULT WITH [facility] RE TIMING. I PREFER HER TO AVOID ALL TOGETHER     Home Medications  Prior to Admission medications   Medication Sig Start Date End Date Taking? Authorizing Provider  acetaminophen (TYLENOL) 500 MG tablet Take 500 mg by mouth every 6 (six) hours as needed (inflammation).   Yes [provider]  dexamethasone (DECADRON) 4 MG tablet Take one tab by mouth twice daily for 7 days; then one tab daily for 7 days; then one-half tab for 7 days; then stop 05/12/19  Yes Gery Pray, MD  ELDERBERRY PO Take 2 tablets by mouth daily.   Yes [provider]  feeding supplement, ENSURE ENLIVE, (ENSURE ENLIVE) LIQD Take 237 mLs by mouth 3 (three) times daily between meals. Patient taking differently: Take 237 mLs by mouth every other day.  05/01/19  Yes Guilford Shi, MD  HYDROcodone-acetaminophen (NORCO/VICODIN) 5-325 MG tablet TAKE ONE TABLET BY MOUTH 4 TIMES A DAY AS INSTRUCTED Patient taking differently: Take 1 tablet by mouth 4 (four) times daily. AS INSTRUCTED 05/01/19  Yes Magrinat, Virgie Dad, MD  JULUCA 50-25 MG TABS TAKE 1 TABLET BY MOUTH DAILY WITH BREAKFAST. TAKE WITH THE PREZCOBIX. Patient taking differently: Take 1 tablet by mouth at bedtime. Take with Prezcobix 04/16/19  Yes Tommy Medal, Lavell Islam, MD  levothyroxine (SYNTHROID) 200 MCG tablet Take 1 tablet (200 mcg total) by mouth daily before breakfast. 02/18/19  Yes Renato Shin, MD  lidocaine-prilocaine (EMLA) cream APPLY 1 APPLICATION TOPICALLY AS NEEDED. APPLY TO PORT SITE 1 HOUR PRIOR TO ACCESS Patient taking differently: Apply 1 application topically as needed (port). Apply to port site 1 hour prior to access 04/24/19  Yes Magrinat, Virgie Dad, MD   Loratadine 10 MG CAPS Take 1 capsule by mouth as needed.    Yes [provider]  Menthol, Topical Analgesic, (BENGAY EX) Apply 1 application topically daily  as needed (muscle pain).   Yes [provider]  methocarbamol (ROBAXIN) 500 MG tablet Take 1 tablet (500 mg total) by mouth every 8 (eight) hours as needed for muscle spasms. 12/08/18  Yes Magrinat, Virgie Dad, MD  Multiple Vitamins-Minerals (MULTIVITAMIN ADULT PO) Take 1 tablet by mouth daily.   Yes [provider]  olmesartan (BENICAR) 40 MG tablet Take 1 tablet (40 mg total) by mouth daily. 05/15/19  Yes Eubanks, Carlos American, NP  PREZCOBIX 800-150 MG tablet TAKE 1 TABLET BY MOUTH DAILY. SWALLOW WHOLE. DO NOT CRUSH, BREAK OR CHEW TABLETS. TAKE WITH FOOD. Patient taking differently: Take 1 tablet by mouth at bedtime.  04/16/19  Yes Tommy Medal, Lavell Islam, MD  Propylene Glycol (SYSTANE BALANCE OP) Place 1 drop into both eyes 4 (four) times daily as needed.   Yes [provider]  SELZENTRY 150 MG tablet TAKE ONE TABLET BY MOUTH TWICE DAILY Patient taking differently: Take 150 mg by mouth 2 (two) times daily.  01/02/19  Yes Tommy Medal, Lavell Islam, MD  Study - REPRIEVE [phone] - pitavastatin 4 mg or placebo tablet ([facility]) Take 1 tablet (4 mg total) by mouth daily. 12/07/15  Yes Tommy Medal, Lavell Islam, MD  olmesartan-hydrochlorothiazide (BENICAR HCT) 40-25 MG tablet TAKE 1 TABLET BY MOUTH DAILY. Patient not taking: Reported on 05/28/2019 05/01/19   Magrinat, Virgie Dad, MD     Critical care time: 55 min

## 2019-05-28 NOTE — ED Notes (Signed)
Pt calm and quiet without acute distress noted.  Able to swallow tab with cut in half.

## 2019-05-28 NOTE — ED Notes (Signed)
ED TO INPATIENT HANDOFF REPORT  ED Nurse Name and Phone #:  [phone]  S Name/Age/Gender Kayla Price 63 y.o. female Room/Bed: 009C/009C  Code Status   Code Status: DNR  Home/SNF/Other Home Patient oriented to: self, place, time and situation Is this baseline? Yes   Triage Complete: Triage complete  Chief Complaint bed sores  Triage Note Patient arrived with EMS from home family concerned about patient's worsening decubitus ulcer at buttocks / generalized weakness , patient has a history of metastatic breast CA .    Allergies Allergies  Allergen Reactions  . Lisinopril Anaphylaxis and Swelling    Swelling of tongue and mouth 11/05/16- tolerates Olmesartan  . Pepcid [Famotidine] Other (See Comments)    PPI H2, BLOCKERS LOWER GASTRIC PH WHICH WOULD LEAD TO SUBTHERAPEUTIC RILPIVIRINE LEVELS AND POTENTIAL VIROLOGICAL FAILURE WITH RESISTANCE  . Prilosec [Omeprazole] Other (See Comments)    PPI H2, BLOCKERS LOWER GASTRIC PH WHICH WOULD LEAD TO SUBTHERAPEUTIC RILPIVIRINE LEVELS AND POTENTIAL VIROLOGICAL FAILURE WITH RESISTANCE  . Tums [Calcium Carbonate Antacid] Other (See Comments)    TUMS ANTACIDS CAN LOWER GASTRIC PH WHICH COULD  LEAD TO SUBTHERAPEUTIC RILPIVIRINE LEVELS AND POTENTIAL VIROLOGICAL FAILURE WITH RESISTANCE TUMS CAN BE GIVEN BUT NEED CONSULT WITH [facility] PHARM[facility] RE TIMING. I PREFER HER TO AVOID ALL TOGETHER    Level of Care/Admitting Diagnosis ED Disposition    ED Disposition Condition Comment   Admit  Hospital Area: [place] [[id number]]  Level of Care: Progressive [102]  Admit to Progressive based on following criteria: MULTISYSTEM THREATS such as stable sepsis, metabolic/electrolyte imbalance with or without encephalopathy that is responding to early treatment.  Covid Evaluation: Confirmed COVID Negative  Diagnosis: Hypotension [[id number]]  Admitting Physician: Donne Hazel [[id number]]  Attending Physician: CHIU, STEPHEN K [[id number]]  Estimated  length of stay: 3 - 4 days  Certification:: I certify this patient will need inpatient services for at least 2 midnights  PT Class (Do Not Modify): Inpatient [101]  PT Acc Code (Do Not Modify): Private [1]       B Medical/Surgery History Past Medical History:  Diagnosis Date  . Alopecia areata 11/28/2009  . Bell's palsy   . Cancer [facility]) 2005   Breast cancer   chemotherapy and radiation  . CKD (chronic kidney disease) stage 3, GFR 30-59 ml/min 06/08/2015  . Dry eye syndrome   . Family history of lung cancer   . Family history of non-Hodgkin's lymphoma   . Fasting hyperglycemia   . Gestational diabetes    2001  . HIP FRACTURE, RIGHT 05/06/2008  . History of kidney stones   . HIV DISEASE 03/27/2006  . HYPERLIPIDEMIA, MIXED 12/15/2007  . HYPERTENSION 03/27/2006  . 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 MENORRHAGIA, POSTMENOPAUSAL 02/03/2009  . Osteoarthritis of left knee 06/08/2015  . OSTEOARTHROSIS, LOCAL, SCND, UNSPC SITE 04/07/2007  . Personal history of chemotherapy 11/2018  . PVD 04/07/2007  . Tinea capitis   . TRIGGER FINGER 05/06/2008  . Unspecified vitamin D deficiency 08/06/2007   Past Surgical History:  Procedure Laterality Date  . BREAST LUMPECTOMY Right    2005  . CHEST TUBE INSERTION Right 05/30/2018   Procedure: INSERTION PLEURAL DRAINAGE CATHETER;  Surgeon: Grace Isaac, MD;  Location: [place];  Service: Thoracic;  Laterality: Right;  . COLONOSCOPY    . COLONOSCOPY WITH ESOPHAGOGASTRODUODENOSCOPY (EGD)    . ENDOBRONCHIAL ULTRASOUND Bilateral 05/15/2018   Procedure: ENDOBRONCHIAL ULTRASOUND;  Surgeon: Collene Gobble, MD;  [facility];  Service: Cardiopulmonary;  Laterality: Bilateral;  . FINE NEEDLE ASPIRATION BIOPSY  05/15/2018   Procedure: FINE NEEDLE ASPIRATION BIOPSY;  Surgeon: Collene Gobble, MD;  Location: [facility];  Service: Cardiopulmonary;;  . FLEXIBLE BRONCHOSCOPY  05/15/2018   Procedure: FLEXIBLE BRONCHOSCOPY;  Surgeon: Collene Gobble, MD;  Location: [facility];  Service: Cardiopulmonary;;  . HYSTEROSCOPY  2006  . IR IMAGING GUIDED PORT INSERTION  07/14/2018  . IR THORACENTESIS ASP PLEURAL SPACE W/IMG GUIDE  04/14/2018  . IR THORACENTESIS ASP PLEURAL SPACE W/IMG GUIDE  04/28/2018  . KNEE ARTHROSCOPY Left 2002  . LYMPH NODE DISSECTION  2005  . MASTECTOMY Right   . MASTECTOMY MODIFIED RADICAL Right 11/05/2016   Procedure: RIGHT MASTECTOMY MODIFIED RADICAL;  Surgeon: Fanny Skates, MD;  Location: [place];  Service: General;  Laterality: Right;  . MODIFIED RADICAL MASTECTOMY Right 11/05/2016  . placement   of port-a-cath    . PLEURAL BIOPSY Right 05/30/2018   Procedure: PLEURAL BIOPSY;  Surgeon: Grace Isaac, MD;  Location: [place];  Service: Thoracic;  Laterality: Right;  . PLEURAL EFFUSION DRAINAGE Right 05/30/2018   Procedure: DRAINAGE OF PLEURAL EFFUSION;  Surgeon: Grace Isaac, MD;  Location: [place];  Service: Thoracic;  Laterality: Right;  . PORT-A-CATH REMOVAL  2006   insertion 2005  . PORT-A-CATH REMOVAL N/A 03/29/2017   Procedure: REMOVAL PORT-A-CATH;  Surgeon: Fanny Skates, MD;  Location: [place];  Service: General;  Laterality: N/A;  . PORTACATH PLACEMENT Right 11/05/2016   Procedure: INSERTION PORT-A-CATH WITH ULTRA SOUND;  Surgeon: Fanny Skates, MD;  Location: [place];  Service: General;  Laterality: Right;  . removal of port a cath  12/2014  . TALC PLEURODESIS Right 05/30/2018   Procedure: Pietro Cassis;  Surgeon: Grace Isaac, MD;  Location: [place];  Service: Thoracic;  Laterality: Right;  . THYROID SURGERY  2009   Ablation   . TOTAL KNEE ARTHROPLASTY Left 02/06/2016   Procedure: TOTAL KNEE ARTHROPLASTY;  Surgeon: Frederik Pear, MD;  Location: [place];  Service: Orthopedics;  Laterality: Left;  . TUBAL LIGATION    . VIDEO ASSISTED THORACOSCOPY Right 05/30/2018   Procedure: VIDEO ASSISTED THORACOSCOPY;  Surgeon: Grace Isaac, MD;  Location: [facility];  Service: Thoracic;  Laterality:  Right;     A IV Location/Drains/Wounds Patient Lines/Drains/Airways Status   Active Line/Drains/Airways    Name:   Placement date:   Placement time:   Site:   Days:   Implanted Port 04/28/19   04/28/19    -    -   30   Peripheral IV 05/28/19 Right Antecubital   05/28/19    0144    Antecubital   less than 1          Intake/Output Last 24 hours  Intake/Output Summary (Last 24 hours) at 05/28/2019 1943 Last data filed at 05/28/2019 1805 Gross per 24 hour  Intake 3100 ml  Output -  Net 3100 ml    Labs/Imaging Results for orders placed or performed during the hospital encounter of 05/27/19 (from the past 48 hour(s))  CBC with Differential     Status: Abnormal   Collection Time: 05/27/19 11:27 PM  Result Value Ref Range   WBC 6.4 4.0 - 10.5 K/uL   RBC 3.60 (L) 3.87 - 5.11 MIL/uL   Hemoglobin 11.2 (L) 12.0 - 15.0 g/dL   HCT 34.4 (L) 36.0 - 46.0 %   MCV 95.6 80.0 - 100.0 fL   MCH 31.1 26.0 - 34.0 pg   MCHC  32.6 30.0 - 36.0 g/dL   RDW 17.5 (H) 11.5 - 15.5 %   Platelets 54 (L) 150 - 400 K/uL    Comment: REPEATED TO VERIFY PLATELET COUNT CONFIRMED BY SMEAR SPECIMEN CHECKED FOR CLOTS Immature Platelet Fraction may be clinically indicated, consider ordering this additional test [id number]    nRBC 0.0 0.0 - 0.2 %   Neutrophils Relative % 92 %   Neutro Abs 5.9 1.7 - 7.7 K/uL   Lymphocytes Relative 5 %   Lymphs Abs 0.3 (L) 0.7 - 4.0 K/uL   Monocytes Relative 2 %   Monocytes Absolute 0.1 0.1 - 1.0 K/uL   Eosinophils Relative 1 %   Eosinophils Absolute 0.0 0.0 - 0.5 K/uL   Basophils Relative 0 %   Basophils Absolute 0.0 0.0 - 0.1 K/uL   Immature Granulocytes 0 %   Abs Immature Granulocytes 0.01 0.00 - 0.07 K/uL    Comment: Performed at [facility] [address] [place], [place] [address]  Comprehensive metabolic panel     Status: Abnormal   Collection Time: 05/27/19 11:27 PM  Result Value Ref Range   Sodium 138 135 - 145 mmol/L   Potassium 5.4 (H) 3.5 - 5.1 mmol/L    Chloride 112 (H) 98 - 111 mmol/L   CO2 19 (L) 22 - 32 mmol/L   Glucose, Bld 112 (H) 70 - 99 mg/dL   BUN 147 (H) 8 - 23 mg/dL   Creatinine, Ser 3.14 (H) 0.44 - 1.00 mg/dL   Calcium 9.3 8.9 - 10.3 mg/dL   Total Protein 6.2 (L) 6.5 - 8.1 g/dL   Albumin 3.3 (L) 3.5 - 5.0 g/dL   AST 26 15 - 41 U/L   ALT 46 (H) 0 - 44 U/L   Alkaline Phosphatase 48 38 - 126 U/L   Total Bilirubin 1.0 0.3 - 1.2 mg/dL   GFR calc non Af Amer 15 (L) >60 mL/min   GFR calc Af Amer 17 (L) >60 mL/min   Anion gap 7 5 - 15    Comment: Performed at [facility], [place] [address] [place], [place] [address]  Lactic acid, plasma     Status: Abnormal   Collection Time: 05/27/19 11:27 PM  Result Value Ref Range   Lactic Acid, Venous 2.0 (HH) 0.5 - 1.9 mmol/L    Comment: CRITICAL RESULT CALLED TO, READ BACK BY AND VERIFIED WITH: SANGELANG B,RN 05/28/19 0002 WAYK Performed at [facility], [place] [address], [place], [place] [address]   Urinalysis, Routine w reflex microscopic     Status: Abnormal   Collection Time: 05/28/19 12:25 AM  Result Value Ref Range   Color, Urine YELLOW YELLOW   APPearance HAZY (A) CLEAR   Specific Gravity, Urine 1.014 1.005 - 1.030   pH 5.0 5.0 - 8.0   Glucose, UA NEGATIVE NEGATIVE mg/dL   Hgb urine dipstick MODERATE (A) NEGATIVE   Bilirubin Urine NEGATIVE NEGATIVE   Ketones, ur NEGATIVE NEGATIVE mg/dL   Protein, ur NEGATIVE NEGATIVE mg/dL   Nitrite NEGATIVE NEGATIVE   Leukocytes,Ua MODERATE (A) NEGATIVE   RBC / HPF 6-10 0 - 5 RBC/hpf   WBC, UA 11-20 0 - 5 WBC/hpf   Bacteria, UA RARE (A) NONE SEEN   Squamous Epithelial / LPF 0-5 0 - 5   Hyaline Casts, UA PRESENT     Comment: Performed at [facility], [address] [place], [place] [address]  Lactic acid, plasma     Status:  Abnormal   Collection Time: 05/28/19  2:24 AM  Result Value Ref Range   Lactic Acid, Venous 2.0 (HH) 0.5 - 1.9 mmol/L    Comment: CRITICAL VALUE NOTED.  VALUE IS CONSISTENT WITH PREVIOUSLY  REPORTED AND CALLED VALUE. Performed at [facility], [place] [address] [place], [place] [address]   SARS CORONAVIRUS 2 (TAT 6-24 HRS) Nasopharyngeal Nasopharyngeal Swab     Status: None   Collection Time: 05/28/19  5:07 AM   Specimen: Nasopharyngeal Swab  Result Value Ref Range   SARS Coronavirus 2 NEGATIVE NEGATIVE    Comment: (NOTE) SARS-CoV-2 target nucleic acids are NOT DETECTED. The SARS-CoV-2 RNA is generally detectable in upper and lower respiratory specimens during the acute phase of infection. Negative results do not preclude SARS-CoV-2 infection, do not rule out co-infections with other pathogens, and should not be used as the sole basis for treatment or other patient management decisions. Negative results must be combined with clinical observations, patient history, and epidemiological information. The expected result is Negative. Fact Sheet for Patients: SugarRoll.be Fact Sheet for Healthcare Providers: https://www.woods-mathews.com/ This test is not yet approved or cleared by the [place] FDA and  has been authorized for detection and/or diagnosis of SARS-CoV-2 by FDA under an Emergency Use Authorization (EUA). This EUA will remain  in effect (meaning this test can be used) for the duration of the COVID-19 declaration under Section 56 4(b)(1) of the Act, 21 U.S.C. section 360bbb-3(b)(1), unless the authorization is terminated or revoked sooner. Performed at [facility], [place] [address] [place], [place] [address]   Sodium, urine, random     Status: None   Collection Time: 05/28/19  5:55 AM  Result Value Ref Range   Sodium, Ur 23 mmol/L    Comment: Performed at [facility] [address] [place], [place] [address]  Creatinine, urine, random     Status: None   Collection Time: 05/28/19  5:55 AM  Result Value Ref Range   Creatinine, Urine 65.83 mg/dL    Comment: Performed at [facility] [address] [place], [place] [address]  Cortisol     Status: None   Collection Time: 05/28/19  6:20 PM  Result Value Ref Range   Cortisol, Plasma 19.2 ug/dL    Comment: (NOTE) AM    6.7 - 22.6 ug/dL PM   <10.0       ug/dL Performed at [facility] [address], [place], [place] [address]   Lactic acid, plasma     Status: Abnormal   Collection Time: 05/28/19  6:22 PM  Result Value Ref Range   Lactic Acid, Venous 2.0 (HH) 0.5 - 1.9 mmol/L    Comment: CRITICAL VALUE NOTED.  VALUE IS CONSISTENT WITH PREVIOUSLY REPORTED AND CALLED VALUE. Performed at [facility], [place] [address], [place], [place] [address]   Basic metabolic panel     Status: Abnormal   Collection Time: 05/28/19  6:22 PM  Result Value Ref Range   Sodium 143 135 - 145 mmol/L   Potassium 4.2 3.5 - 5.1 mmol/L   Chloride 118 (H) 98 - 111 mmol/L   CO2 19 (L) 22 - 32 mmol/L   Glucose, Bld 107 (H) 70 - 99 mg/dL   BUN 96 (H) 8 - 23 mg/dL   Creatinine, Ser 1.88 (H) 0.44 - 1.00 mg/dL    Comment: RESULT REPEATED AND VERIFIED   Calcium 8.5 (L) 8.9 - 10.3 mg/dL  GFR calc non Af Amer 28 (L) >60 mL/min   GFR calc Af Amer 32 (L) >60 mL/min   Anion gap 6 5 - 15    Comment: Performed at [facility] [address], [place], [place] [address]   US Renal  Result Date: 05/28/2019 CLINICAL DATA:  Acute kidney injury. EXAM: RENAL / URINARY TRACT ULTRASOUND COMPLETE COMPARISON:  PET CT scan 03/11/2019. FINDINGS: Right Kidney: Renal measurements: 9.9 x 4.3 x 5.0 cm = volume: 109.7 mL . Echogenicity within normal limits. No mass or hydronephrosis visualized. Left Kidney: Renal measurements: 9.6 x 5.4 x 4.9 cm = volume: Is 134.3. mL. Echogenicity within normal limits. No mass or hydronephrosis visualized. Bladder: Appears normal for degree of bladder distention. Other: Small right pleural effusion and gallstones are noted as seen on the prior exam. IMPRESSION: Negative for hydronephrosis.  Normal appearing kidneys. Right  pleural effusion. Gallstones. Electronically Signed   By: Inge Rise M.D.   On: 05/28/2019 07:15    Pending Labs Unresulted Labs (From admission, onward)    Start     Ordered   05/29/19 0500  CBC  Tomorrow morning,   R     05/28/19 0556   05/29/19 8250  Basic metabolic panel  Tomorrow morning,   R     05/28/19 0605   05/28/19 1752  Lactic acid, plasma  STAT Now then every 3 hours,   R (with STAT occurrences)     05/28/19 1751   05/28/19 0557  Culture, blood (routine x 2)  BLOOD CULTURE X 2,   R (with STAT occurrences)     05/28/19 0556   05/28/19 0556  Urine Culture  Add-on,   AD     05/28/19 0556          Vitals/Pain Today's Vitals   05/28/19 1830 05/28/19 1900 05/28/19 1930 05/28/19 1941  BP: 95/68 102/68 104/70   Pulse: 79  81   Resp: _0 Temp:      TempSrc:      SpO2: 100%  98%   PainSc:    0-No pain    Isolation Precautions No active isolations  Medications Medications  0.9 %  sodium chloride infusion (has no administration in time range)  acetaminophen (TYLENOL) tablet 650 mg (has no administration in time range)    Or  acetaminophen (TYLENOL) suppository 650 mg (has no administration in time range)  HYDROcodone-acetaminophen (NORCO/VICODIN) 5-325 MG per tablet 1-2 tablet (1 tablet Oral Given 05/28/19 0934)  cefTRIAXone (ROCEPHIN) 1 g in sodium chloride 0.9 % 100 mL IVPB (0 g Intravenous Stopped 05/28/19 0935)  nystatin (MYCOSTATIN/NYSTOP) topical powder ( Topical Not Given 05/28/19 1325)  sodium chloride 0.9 % bolus 1,000 mL (0 mLs Intravenous Stopping Infusion hung by another clincian 05/28/19 1939)  lidocaine (LMX) 4 % cream ( Topical Not Given 05/28/19 1938)  HYDROcodone-acetaminophen (HYCET) 7.5-325 mg/15 ml solution 10 mL (10 mLs Oral Given 05/28/19 0345)  sodium chloride 0.9 % bolus 1,000 mL (0 mLs Intravenous Stopped 05/28/19 0935)  0.9 %  sodium chloride infusion ( Intravenous Stopped 05/28/19 0935)  sodium chloride 0.9 % bolus 1,000 mL (0 mLs  Intravenous Stopped 05/28/19 0704)  sodium chloride 0.9 % bolus 1,000 mL (0 mLs Intravenous Stopped 05/28/19 1805)  hydrocortisone sodium succinate (SOLU-CORTEF) 100 MG injection 50 mg (50 mg Intravenous Given 05/28/19 1819)    Mobility walks with device Moderate fall risk   Focused Assessments -   R Recommendations: See Admitting Provider  Note  Report given to:   Additional Notes:

## 2019-05-28 NOTE — Evaluation (Addendum)
Physical Therapy Evaluation Patient Details Name: Kayla Price MRN: [id number] DOB: [birth year] Today's Date: 05/28/2019   History of Present Illness  Kayla Price is a 63 y.o. female with medical history significant of metastatic breast cancer currently on chemo, HIV, hypertension, hyperlipidemia, CKD, anemia of chronic disease presenting to the ED for evaluation of generalized weakness and buttock ulcer.  Clinical Impression  Patient presents with significant global weakness and decreased activity tolerance today ambulating about 24' with RW and min to mod A and HR up to 120.  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  Feel she will benefit from follow up [place] and RW versus rollator for home.  Will follow acutely and try out with a rollator next session to determine most appropriate DME.    Follow Up Recommendations Home health PT;Supervision/Assistance - 24 hour(HH aide)    Equipment Recommendations  Rolling walker with 5" wheels(versus rollator with 4 wheels and seat)    Recommendations for Other Services       Precautions / Restrictions Precautions Precautions: Fall Precaution Comments: watch HR      Mobility  Bed Mobility Overal bed mobility: Needs Assistance Bed Mobility: Supine to Sit;Sit to Supine     Supine to sit: Mod assist;HOB elevated Sit to supine: Mod assist   General bed mobility comments: assist for L leg off bed and to lift trunk; to supine assist for legs onto bed  Transfers Overall transfer level: Needs assistance Equipment used: Rolling walker (2 wheeled) Transfers: Sit to/from Stand Sit to Stand: Mod assist         General transfer comment: up from elevated height on stretcher mod A for safety to block feet from sliding and for stabilizing walker  Ambulation/Gait Ambulation/Gait assistance: Min assist;Mod assist Gait Distance (Feet): 50 Feet Assistive  device: Rolling walker (2 wheeled) Gait Pattern/deviations: Step-to pattern;Shuffle;Decreased stride length;Decreased dorsiflexion - right;Decreased dorsiflexion - left     General Gait Details: shuffling pattern assist at hips for safety due to significant weakness, but no LOB, stopped to rest x 2-3 in standing; HR 120 after ambulation dyspnea 2-3/4  Stairs            Wheelchair Mobility    Modified Rankin (Stroke Patients Only)       Balance Overall balance assessment: Needs assistance Sitting-balance support: Feet unsupported;Bilateral upper extremity supported Sitting balance-Leahy Scale: Poor Sitting balance - Comments: fearful of sliding off stretcher, assist for safety   Standing balance support: Bilateral upper extremity supported Standing balance-Leahy Scale: Poor Standing balance comment: reliant on UE support                             Pertinent Vitals/Pain Pain Assessment: Faces Faces Pain Scale: Hurts even more Pain Location: generalized Pain Descriptors / Indicators: Grimacing;Aching Pain Intervention(s): Monitored during session;Repositioned    Home Living Family/patient expects to be discharged to:: Private residence Living Arrangements: Children(daughter and Curator) Available Help at Discharge: Family Type of Home: House Home Access: Ramped entrance     Home Layout: One level Home Equipment: Cane - single point;Toilet riser;Wheelchair - manual Additional Comments: states neice has been able to get her a wheelchair    Prior Function Level of Independence: Independent with assistive device(s)         Comments: one fall about a month ago 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  [employer]     Hand Dominance        Extremity/Trunk Assessment   Upper Extremity Assessment Upper Extremity Assessment: RUE deficits/detail;LUE deficits/detail RUE Deficits / Details: not specifically tested, but noted on moving  to don gown pt needing assist to lift from shoulder LUE Deficits / Details: lifts but generazlied weakness    Lower Extremity Assessment Lower Extremity Assessment: LLE deficits/detail;RLE deficits/detail RLE Deficits / Details: AAROM WFL, strength hip flexion 2/5, knee extension 3+/5; h/o R TKA LLE Deficits / Details: AAROM WFL, strength hip flexion 2/5, knee extension 4-/5    Cervical / Trunk Assessment Cervical / Trunk Assessment: Other exceptions Cervical / Trunk Exceptions: significant core weakness evident with mobility  Communication   Communication: No difficulties  Cognition Arousal/Alertness: Awake/alert Behavior During Therapy: WFL for tasks assessed/performed Overall Cognitive Status: Within Functional Limits for tasks assessed                                        General Comments General comments (skin integrity, edema, etc.): reports more than 100# weight loss since last year, noted sagging skin over extremities    Exercises     Assessment/Plan    PT Assessment Patient needs continued PT services  PT Problem List Decreased strength;Decreased activity tolerance;Decreased mobility;Decreased balance;Decreased knowledge of use of DME;Cardiopulmonary status limiting activity       PT Treatment Interventions DME instruction;Therapeutic activities;Balance training;Therapeutic exercise;Functional mobility training;Gait training;Patient/family education    PT Goals (Current goals can be found in the Care Plan section)  Acute Rehab PT Goals Patient Stated Goal: to go home and get stronger PT Goal Formulation: With patient Time For Goal Achievement: 06/11/19 Potential to Achieve Goals: Fair    Frequency Min 3X/week   Barriers to discharge        Co-evaluation               AM-PAC PT "6 Clicks" Mobility  Outcome Measure Help needed turning from your back to your side while in a flat bed without using bedrails?: A Little Help needed  moving from lying on your back to sitting on the side of a flat bed without using bedrails?: A Lot Help needed moving to and from a bed to a chair (including a wheelchair)?: A Little Help needed standing up from a chair using your arms (e.g., wheelchair or bedside chair)?: A Lot Help needed to walk in hospital room?: A Lot Help needed climbing 3-5 steps with a railing? : A Lot 6 Click Score: 14    End of Session   Activity Tolerance: Patient limited by fatigue Patient left: in bed;with call bell/phone within reach   PT Visit Diagnosis: Other abnormalities of gait and mobility (R26.89);Muscle weakness (generalized) (M62.81);Adult, failure to thrive (R62.7)    Time: 3220-2542 PT Time Calculation (min) (ACUTE ONLY): 37 min   Charges:   PT Evaluation $PT Eval Moderate Complexity: 1 Mod PT Treatments $Gait Training: 8-22 mins        Magda Kiel, [place] [facility] [phone] 05/28/2019   Reginia Naas 05/28/2019, 4:06 PM

## 2019-05-28 NOTE — ED Notes (Signed)
Ordered diet tray 

## 2019-05-28 NOTE — ED Notes (Signed)
Pt reports relief of pain sx.  Log rolled with 2 person (A) to evaluate buttocks area.  Maceration and breakdown noted to inner thighs towards peri area with strong odor.  No gross open wounds or pressure ulcers identified.  Cleansed and copious amounts of barrier cream applied.  Macerated areas to peri area folds cleansed with saline and barrier ointment to areas as well.  Pt tolerated well.

## 2019-05-28 NOTE — ED Notes (Signed)
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  No other needs at this time.

## 2019-05-28 NOTE — ED Notes (Signed)
IV team at bedside 

## 2019-05-28 NOTE — Progress Notes (Signed)
PROGRESS NOTE    Kayla Price  FYB:[id number] DOB: 07-10-1955 DOA: 05/27/2019 PCP: Lauree Chandler, NP    Brief Narrative:  63 y.o. female with medical history significant of metastatic breast cancer currently on chemo, HIV, hypertension, hyperlipidemia, CKD, anemia of chronic disease presenting to the ED for evaluation of generalized weakness and buttock ulcer.  Patient states since she left the hospital last month she has been wearing pull-ups.  Her family has noted that she has ulcers in her genital and buttock region.  These ulcers are painful whenever she tries to move.  It burns when she urinates.  She has not had any fevers.  She has not been sexually active since 2005.  No cough or shortness of breath.  She has not been eating much.  No nausea, vomiting, abdominal pain, or diarrhea.  States she was previously taking both olmesartan and hydrochlorothiazide.  She had an appointment with her PCP a few days ago and hydrochlorothiazide was stopped.  Since then, she is only taking olmesartan.   ED Course: Hypotensive with recorded blood pressure as low as 76/55.  Afebrile and not tachycardic.  No leukocytosis.  Lactic acid 2.0>2.0.  Hemoglobin 11.2, stable.  Platelet count 54,000, was 86,000 on 11/24.  Potassium 5.4.  Bicarb 19, anion gap 7.  Blood glucose 112.  BUN 147, creatinine 3.1.  UA with moderate amount of leukocytes, 11-20 WBCs, and rare bacteria.  Blood culture x2 pending. 1 L normal saline bolus ordered.  Hycet given for pain.  Assessment & Plan:   Principal Problem:   AKI (acute kidney injury) ([place]) Active Problems:   BREAST CANCER, HX OF   Hyperkalemia   UTI (urinary tract infection)   Hypotension   ARF (acute renal failure) ([facility])   AKI on CKD stage III -Suspect prerenal due to decreased p.o. intake/dehydration and diuretic/ARB use.   BUN 147, creatinine 3.1 at presentation.  Baseline creatinine 1.3-1.4. -No AM labs this AM, have ordered and are pending -Continued  on aggressive IVF hydration at 125cc/hr -Hold diuretic/ARB.  Avoid nephrotoxic agents/contrast. -Renal US reviewed and is unremarkable  Mild hyperkalemia -Likely related to AKI and ARB use.  Potassium 5.4. -Management of AKI as above -Hold ARB -Repeat BMET is pending  Mild normal anion gap metabolic acidosis Bicarb 19, anion gap 7.  Likely related to AKI and diuretic use. -Management of AKI as mentioned above -Holding diuretic -Continuing aggressvie IVF hydration as tolerated  UTI UA with moderate amount of leukocytes, 11-20 WBCs, and rare bacteria. -Continued on empiric Ceftriaxone -Urine culture is pending  Hypotension -Hypotensive with recorded blood pressure as low as 76/55.   -Pt remains afebrile and not tachycardic.  No leukocytosis.   -Lactic acid 2.0>2.0.  -Thus far, patient has required 3 L boluses of NS with SBP remaining in the 70's -Have discussed case with Critical Care who will see in consultation. Will give trial of 50mg  IV hydrocortisone -Continue aggressive IVF as tolerated  Superficial skin breakdown/ulcers in the genital/groin/buttocks region, ?intertrigo -No signs of cellulitis noted at time of presentation -Receiving ceftriaxone for UTI as mentioned above -Wound care consulted -Nystatin powder was ordered on presentation -Norco as needed for pain -Tylenol as needed  HIV -Followed by infectious disease.  Last CD4 count 475 on 10/26.  Hypertension -Holding irbesartan and hydrochlorothiazide given AKI  -See above, pt now hypotensive  Hypothyroidism -TSH normal on 04/03/2019. -Continue Synthroid as tolerated  Anemia of chronic disease -Hemoglobin 11.2 on presentation. -No signs of active bleeding.  Metastatic breast cancer currently on chemo -Oncology following  Worsening thrombocytopenia Possibly related to chemo. Platelet count 54,000, was 86,000 on 11/24. No signs of active bleeding. -Continue to monitor CBC -Avoid  anticoagulation/antiplatelet agents  DVT prophylaxis: SCD's Code Status: DNR Family Communication: Pt in room, family not at bedside Disposition Plan: Uncertain at this time  Consultants:   PCCM  Procedures:     Antimicrobials: Anti-infectives (From admission, onward)   Start     Dose/Rate Route Frequency Ordered Stop   05/28/19 0630  cefTRIAXone (ROCEPHIN) 1 g in sodium chloride 0.9 % 100 mL IVPB     1 g 200 mL/hr over 30 Minutes Intravenous Daily 05/28/19 0556         Subjective: Without complaints this afternoon  Objective: Vitals:   05/28/19 1600 05/28/19 1630 05/28/19 1730 05/28/19 1817  BP: (!) 80/55 (!) 79/53 (!) 77/53 93/64  Pulse:    78  Resp: 16 18 16 13   Temp:      TempSrc:      SpO2:    100%    Intake/Output Summary (Last 24 hours) at 05/28/2019 1846 Last data filed at 05/28/2019 1805 Gross per 24 hour  Intake 3100 ml  Output -  Net 3100 ml   There were no vitals filed for this visit.  Examination: General exam: Appears calm and comfortable  Respiratory system: Clear to auscultation. Respiratory effort normal. Cardiovascular system: S1 & S2 heard, Regular Gastrointestinal system: Abdomen is nondistended, soft and nontender. No organomegaly or masses felt. Normal bowel sounds heard. Central nervous system: Alert and oriented. No focal neurological deficits. Extremities: Symmetric 5 x 5 power. Skin: No rashes, decreased skin turgor Psychiatry: Judgement and insight appear normal. Mood & affect appropriate.   Data Reviewed: I have personally reviewed following labs and imaging studies  CBC: Recent Labs  Lab 05/27/19 2327  WBC 6.4  NEUTROABS 5.9  HGB 11.2*  HCT 34.4*  MCV 95.6  PLT 54*   Basic Metabolic Panel: Recent Labs  Lab 05/27/19 2327  NA 138  K 5.4*  CL 112*  CO2 19*  GLUCOSE 112*  BUN 147*  CREATININE 3.14*  CALCIUM 9.3   GFR: Estimated Creatinine Clearance: 20.9 mL/min (A) (by C-G formula based on SCr of 3.14 mg/dL  (H)). Liver Function Tests: Recent Labs  Lab 05/27/19 2327  AST 26  ALT 46*  ALKPHOS 48  BILITOT 1.0  PROT 6.2*  ALBUMIN 3.3*   No results for input(s): LIPASE, AMYLASE in the last 168 hours. No results for input(s): AMMONIA in the last 168 hours. Coagulation Profile: No results for input(s): INR, PROTIME in the last 168 hours. Cardiac Enzymes: No results for input(s): CKTOTAL, CKMB, CKMBINDEX, TROPONINI in the last 168 hours. BNP (last 3 results) No results for input(s): PROBNP in the last 8760 hours. HbA1C: No results for input(s): HGBA1C in the last 72 hours. CBG: No results for input(s): GLUCAP in the last 168 hours. Lipid Profile: No results for input(s): CHOL, HDL, LDLCALC, TRIG, CHOLHDL, LDLDIRECT in the last 72 hours. Thyroid Function Tests: No results for input(s): TSH, T4TOTAL, FREET4, T3FREE, THYROIDAB in the last 72 hours. Anemia Panel: No results for input(s): VITAMINB12, FOLATE, FERRITIN, TIBC, IRON, RETICCTPCT in the last 72 hours. Sepsis Labs: Recent Labs  Lab 05/27/19 2327 05/28/19 0224  LATICACIDVEN 2.0* 2.0*    Recent Results (from the past 240 hour(s))  SARS CORONAVIRUS 2 (TAT 6-24 HRS) Nasopharyngeal Nasopharyngeal Swab     Status: None   Collection Time:  05/28/19  5:07 AM   Specimen: Nasopharyngeal Swab  Result Value Ref Range Status   SARS Coronavirus 2 NEGATIVE NEGATIVE Final    Comment: (NOTE) SARS-CoV-2 target nucleic acids are NOT DETECTED. The SARS-CoV-2 RNA is generally detectable in upper and lower respiratory specimens during the acute phase of infection. Negative results do not preclude SARS-CoV-2 infection, do not rule out co-infections with other pathogens, and should not be used as the sole basis for treatment or other patient management decisions. Negative results must be combined with clinical observations, patient history, and epidemiological information. The expected result is Negative. Fact Sheet for Patients:  SugarRoll.be Fact Sheet for Healthcare Providers: https://www.woods-mathews.com/ This test is not yet approved or cleared by the [place] FDA and  has been authorized for detection and/or diagnosis of SARS-CoV-2 by FDA under an Emergency Use Authorization (EUA). This EUA will remain  in effect (meaning this test can be used) for the duration of the COVID-19 declaration under Section 56 4(b)(1) of the Act, 21 U.S.C. section 360bbb-3(b)(1), unless the authorization is terminated or revoked sooner. Performed at [facility], [place] [address], [place], [place] [address]      Radiology Studies: US Renal  Result Date: 05/28/2019 CLINICAL DATA:  Acute kidney injury. EXAM: RENAL / URINARY TRACT ULTRASOUND COMPLETE COMPARISON:  PET CT scan 03/11/2019. FINDINGS: Right Kidney: Renal measurements: 9.9 x 4.3 x 5.0 cm = volume: 109.7 mL . Echogenicity within normal limits. No mass or hydronephrosis visualized. Left Kidney: Renal measurements: 9.6 x 5.4 x 4.9 cm = volume: Is 134.3. mL. Echogenicity within normal limits. No mass or hydronephrosis visualized. Bladder: Appears normal for degree of bladder distention. Other: Small right pleural effusion and gallstones are noted as seen on the prior exam. IMPRESSION: Negative for hydronephrosis.  Normal appearing kidneys. Right pleural effusion. Gallstones. Electronically Signed   By: Inge Rise M.D.   On: 05/28/2019 07:15    Scheduled Meds: . lidocaine   Topical Once  . nystatin   Topical TID   Continuous Infusions: . cefTRIAXone (ROCEPHIN)  IV Stopped (05/28/19 0935)  . sodium chloride       LOS: 0 days   Marylu Lund, MD [facility] Pager On Amion  If 7PM-7AM, please contact night-coverage 05/28/2019, 6:46 PM

## 2019-05-28 NOTE — Progress Notes (Signed)
[place]  [phone]) [phone] Fax:(336) [phone]    ID: Kayla Price DOB: 02/16/1956  MR#: [id number]  PZW#:[id number]  Patient Care Team: Lauree Chandler, NP as PCP - General (Nurse Practitioner) Tommy Medal, Lavell Islam, MD as PCP - Infectious Diseases (Infectious Diseases) Clent Jacks, MD as Consulting Physician (Ophthalmology) Renato Shin, MD as Consulting Physician (Endocrinology) Frederik Pear, MD as Consulting Physician (Orthopedic Surgery) Magrinat, Virgie Dad, MD as Consulting Physician (Oncology) Fanny Skates, MD as Consulting Physician (General Surgery) Donnamae Jude, MD as Consulting Physician (Obstetrics and Gynecology) Delice Bison, Charlestine Massed, NP as Nurse Practitioner (Hematology and Oncology) Webb Laws, [place] as Referring Physician (Optometry) OTHER MD:   CHIEF COMPLAINT: Triple negative breast cancer, recurrent  CURRENT TREATMENT:  eribulin; Zometa  INTERVAL HISTORY: Laysa was unable to come to her 05/29/2019 visit because she was admitted to the hospital with generalized weakness and genital and buttock ulcers.  She had been receiving Eribulin given every two weeks.  Her last treatment here was 04/17/2019.  She tells me she generally tolerated the eribulin well.  She does not have any peripheral neuropathy although she complains of pain and discomfort and some numbness in the right lower arm, from the elbow to the hand.  This is likely not due to the eribulin.   REVIEW OF SYSTEMS: [place]    BREAST CANCER HISTORY: From the original intake note:  [place] is a history of right-sided breast cancer dating back to 2005. She had a lumpectomy with sentinel lymph node sampling, chemotherapy, and radiation. I do not have access to those records at present.  More recently she had bilateral screening mammography at the [facility] 09/25/2016 showing a possible mass in the right breast. Diagnostic mammography with  ultrasonography on 09/28/2016 the patient underwent right diagnostic mammography with tomography and right breast ultrasonography. The breast density was category A. In the right breast at the 10:00 position there was an irregular mass measuring 2.5 cm. Ultrasound identified this the 10:00 radiant 10 cm from the nipple measuring 2.4 cm. In the right axilla there was an abnormal lymph node measuring 1.3 cm with other normal-appearing lymph nodes.  On 10/01/2016 she  underwent biopsy of the right breast mass in question as well as the suspicious axillary lymph node. 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 The proliferation marker was 70% in the breast lesion and 50% in the lymph node.  Her subsequent history is as detailed below.   MEDICAL HISTORY: Past Medical History:  Diagnosis Date  . Alopecia areata 11/28/2009  . Bell's palsy   . Cancer [facility]) 2005   Breast cancer   chemotherapy and radiation  . CKD (chronic kidney disease) stage 3, GFR 30-59 ml/min 06/08/2015  . Dry eye syndrome   . Family history of lung cancer   . Family history of non-Hodgkin's lymphoma   . Fasting hyperglycemia   . Gestational diabetes    2001  . HIP FRACTURE, RIGHT 05/06/2008  . History of kidney stones   . HIV DISEASE 03/27/2006  . HYPERLIPIDEMIA, MIXED 12/15/2007  . HYPERTENSION 03/27/2006  . 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 MENORRHAGIA, POSTMENOPAUSAL 02/03/2009  . Osteoarthritis of left knee 06/08/2015  . OSTEOARTHROSIS, LOCAL, SCND, UNSPC SITE 04/07/2007  . Personal history of chemotherapy 11/2018  . PVD 04/07/2007  . Tinea capitis   . TRIGGER FINGER 05/06/2008  . Unspecified vitamin D  deficiency 08/06/2007    SURGICAL HISTORY:  Past Surgical History:  Procedure Laterality Date  . BREAST LUMPECTOMY Right    2005  . CHEST TUBE INSERTION Right  05/30/2018   Procedure: INSERTION PLEURAL DRAINAGE CATHETER;  Surgeon: Grace Isaac, MD;  Location: [place];  Service: Thoracic;  Laterality: Right;  . COLONOSCOPY    . COLONOSCOPY WITH ESOPHAGOGASTRODUODENOSCOPY (EGD)    . ENDOBRONCHIAL ULTRASOUND Bilateral 05/15/2018   Procedure: ENDOBRONCHIAL ULTRASOUND;  Surgeon: Collene Gobble, MD;  [facility];  Service: Cardiopulmonary;  Laterality: Bilateral;  . FINE NEEDLE ASPIRATION BIOPSY  05/15/2018   Procedure: FINE NEEDLE ASPIRATION BIOPSY;  Surgeon: Collene Gobble, MD;  Location: [facility];  Service: Cardiopulmonary;;  . FLEXIBLE BRONCHOSCOPY  05/15/2018   Procedure: FLEXIBLE BRONCHOSCOPY;  Surgeon: Collene Gobble, MD;  Location: [facility];  Service: Cardiopulmonary;;  . HYSTEROSCOPY  2006  . IR IMAGING GUIDED PORT INSERTION  07/14/2018  . IR THORACENTESIS ASP PLEURAL SPACE W/IMG GUIDE  04/14/2018  . IR THORACENTESIS ASP PLEURAL SPACE W/IMG GUIDE  04/28/2018  . KNEE ARTHROSCOPY Left 2002  . LYMPH NODE DISSECTION  2005  . MASTECTOMY Right   . MASTECTOMY MODIFIED RADICAL Right 11/05/2016   Procedure: RIGHT MASTECTOMY MODIFIED RADICAL;  Surgeon: Fanny Skates, MD;  Location: [place];  Service: General;  Laterality: Right;  . MODIFIED RADICAL MASTECTOMY Right 11/05/2016  . placement   of port-a-cath    . PLEURAL BIOPSY Right 05/30/2018   Procedure: PLEURAL BIOPSY;  Surgeon: Grace Isaac, MD;  Location: [place];  Service: Thoracic;  Laterality: Right;  . PLEURAL EFFUSION DRAINAGE Right 05/30/2018   Procedure: DRAINAGE OF PLEURAL EFFUSION;  Surgeon: Grace Isaac, MD;  Location: [place];  Service: Thoracic;  Laterality: Right;  . PORT-A-CATH REMOVAL  2006   insertion 2005  . PORT-A-CATH REMOVAL N/A 03/29/2017   Procedure: REMOVAL PORT-A-CATH;  Surgeon: Fanny Skates, MD;  Location: [place];  Service: General;  Laterality: N/A;  . PORTACATH PLACEMENT Right 11/05/2016   Procedure: INSERTION PORT-A-CATH WITH ULTRA SOUND;   Surgeon: Fanny Skates, MD;  Location: [place];  Service: General;  Laterality: Right;  . removal of port a cath  12/2014  . TALC PLEURODESIS Right 05/30/2018   Procedure: Pietro Cassis;  Surgeon: Grace Isaac, MD;  Location: [place];  Service: Thoracic;  Laterality: Right;  . THYROID SURGERY  2009   Ablation   . TOTAL KNEE ARTHROPLASTY Left 02/06/2016   Procedure: TOTAL KNEE ARTHROPLASTY;  Surgeon: Frederik Pear, MD;  Location: [place];  Service: Orthopedics;  Laterality: Left;  . TUBAL LIGATION    . VIDEO ASSISTED THORACOSCOPY Right 05/30/2018   Procedure: VIDEO ASSISTED THORACOSCOPY;  Surgeon: Grace Isaac, MD;  Location: [facility];  Service: Thoracic;  Laterality: Right;    FAMILY HISTORY: Family History  Problem Relation Age of Onset  . Heart attack Brother        Massive MI in 30s  . Stroke Brother        CAD  . Kidney disease Mother   . Stroke Mother   . Diabetes Mother   . Liver disease Sister   . COPD Sister        had I-131 rx of hyperthyroidism  . Diabetes Sister   . Stroke Sister   . Lung cancer Paternal Uncle        hx smoking  . Cancer Cousin   . Non-Hodgkin's lymphoma Cousin 25       cancer  x3, in prostate and lung- unsure if met/spread or if primaries  . Heart failure Father   . Heart disease Father   . Arthritis Father   . Sarcoidosis Sister   The patient's father died at the age of 63 in the setting of Alzheimer's disease. The patient's mother died at the age of 7 from complications of diabetes. The patient has 3 brothers, 4 sisters. There is no history of breast or ovarian cancer in the family area.   GYNECOLOGIC HISTORY:  Patient's last menstrual period was 11/06/2003.  menarche age 78, first live birth age 45, the patient is [place] P2. She stopped having periods in 2005, with her chemotherapy; she never took hormone replacement    SOCIAL HISTORY:  Suha worked for the [employer] more than 20 years. 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 At home she lives with her daughter Orland Dec who works for [employer] (currently from home) and her granddaughter Donita Brooks, 55 y/o as of November 2020.  The patient's son also drops in frequently.  They are all careful regarding pandemic precautions                          ADVANCED DIRECTIVES:  not in place    HEALTH MAINTENANCE: Social History   Tobacco Use  . Smoking status: Never Smoker  . Smokeless tobacco: Never Used  Substance Use Topics  . Alcohol use: No  . Drug use: No               Colonoscopy:             PAP:             Bone density:  Allergies  Allergen Reactions  . Lisinopril Anaphylaxis and Swelling    Swelling of tongue and mouth 11/05/16- tolerates Olmesartan  . Pepcid [Famotidine] Other (See Comments)    PPI H2, BLOCKERS LOWER GASTRIC PH WHICH WOULD LEAD TO SUBTHERAPEUTIC RILPIVIRINE LEVELS AND POTENTIAL VIROLOGICAL FAILURE WITH RESISTANCE  . Prilosec [Omeprazole] Other (See Comments)    PPI H2, BLOCKERS LOWER GASTRIC PH WHICH WOULD LEAD TO SUBTHERAPEUTIC RILPIVIRINE LEVELS AND POTENTIAL VIROLOGICAL FAILURE WITH RESISTANCE  . Tums [Calcium Carbonate Antacid] Other (See Comments)    TUMS ANTACIDS CAN LOWER GASTRIC PH WHICH COULD  LEAD TO SUBTHERAPEUTIC RILPIVIRINE LEVELS AND POTENTIAL VIROLOGICAL FAILURE WITH RESISTANCE TUMS CAN BE GIVEN BUT NEED CONSULT WITH [facility]ARM[facility] RE TIMING. I PREFER HER TO AVOID ALL TOGETHER    No current facility-administered medications for this visit.    No current outpatient medications on file.   Facility-Administered Medications Ordered in Other Visits  Medication Dose Route Frequency Provider Last Rate Last Dose  . acetaminophen (TYLENOL) tablet 650 mg  650 mg Oral Q6H PRN Shela Leff, MD       Or  . acetaminophen (TYLENOL) suppository 650 mg  650 mg Rectal Q6H PRN Shela Leff, MD      . cefTRIAXone (ROCEPHIN) 1 g in sodium chloride 0.9 % 100 mL IVPB  1 g Intravenous Daily  Shela Leff, MD 200 mL/hr at 05/29/19 0936 1 g at 05/29/19 0936  . Chlorhexidine Gluconate Cloth 2 % PADS 6 each  6 each Topical Daily Donne Hazel, MD   6 each at 05/29/19 [phone]  . feeding supplement (ENSURE ENLIVE) (ENSURE ENLIVE) liquid 237 mL  237 mL Oral TID BM Donne Hazel, MD      .  HYDROcodone-acetaminophen (NORCO/VICODIN) 5-325 MG per tablet 1-2 tablet  1-2 tablet Oral Q4H PRN Shela Leff, MD   2 tablet at 05/29/19 1411  . lidocaine (LMX) 4 % cream   Topical Once Donne Hazel, MD      . nystatin (MYCOSTATIN/NYSTOP) topical powder   Topical TID Shela Leff, MD      . sodium chloride flush (NS) 0.9 % injection 10-40 mL  10-40 mL Intracatheter Q12H Donne Hazel, MD      . sodium chloride flush (NS) 0.9 % injection 10-40 mL  10-40 mL Intracatheter PRN Donne Hazel, MD         PHYSICAL EXAMINATION: There were no vitals filed for this visit. There is no height or weight on file to calculate BMI.   There were no vitals filed for this visit.Weight was 222 pounds 04/03/2019  ECOG PERFORMANCE STATUS: 2 - Symptomatic, <50% confined to bed    LABORATORY DATA: Lab Results  Component Value Date   WBC 2.7 (L) 05/29/2019   HGB 8.8 (L) 05/29/2019   HCT 27.2 (L) 05/29/2019   MCV 96.8 05/29/2019   PLT 40 (L) 05/29/2019      Chemistry      Component Value Date/Time   NA 144 05/29/2019 0551   NA 140 05/13/2017 0927   K 3.9 05/29/2019 0551   K 4.1 05/13/2017 0927   CL 119 (H) 05/29/2019 0551   CO2 18 (L) 05/29/2019 0551   CO2 29 05/13/2017 0927   BUN 72 (H) 05/29/2019 0551   BUN 17.7 05/13/2017 0927   CREATININE 1.39 (H) 05/29/2019 0551   CREATININE 1.35 (H) 04/17/2019 1304   CREATININE 1.62 (H) 09/18/2018 0911   CREATININE 1.4 (H) 05/13/2017 0927   GLU 104 02/13/2016 1358      Component Value Date/Time   CALCIUM 8.5 (L) 05/29/2019 0551   CALCIUM 10.5 (H) 05/13/2017 0927   ALKPHOS 48 05/27/2019 2327   ALKPHOS 90 05/13/2017 0927   AST 26  05/27/2019 2327   AST 39 04/17/2019 1304   AST 14 05/13/2017 0927   ALT 46 (H) 05/27/2019 2327   ALT 12 04/17/2019 1304   ALT 12 05/13/2017 0927   BILITOT 1.0 05/27/2019 2327   BILITOT 0.3 04/17/2019 1304   BILITOT 0.27 05/13/2017 0927       RADIOGRAPHIC STUDIES:  US Renal  Result Date: 06/24/19 CLINICAL DATA:  Acute kidney injury. EXAM: RENAL / URINARY TRACT ULTRASOUND COMPLETE COMPARISON:  PET CT scan 03/11/2019. FINDINGS: Right Kidney: Renal measurements: 9.9 x 4.3 x 5.0 cm = volume: 109.7 mL . Echogenicity within normal limits. No mass or hydronephrosis visualized. Left Kidney: Renal measurements: 9.6 x 5.4 x 4.9 cm = volume: Is 134.3. mL. Echogenicity within normal limits. No mass or hydronephrosis visualized. Bladder: Appears normal for degree of bladder distention. Other: Small right pleural effusion and gallstones are noted as seen on the prior exam. IMPRESSION: Negative for hydronephrosis.  Normal appearing kidneys. Right pleural effusion. Gallstones. Electronically Signed   By: Inge Rise M.D.   On: June 24, 2019 07:15     ASSESSMENT: 63 y.o. [place] woman   (1) status post right lumpectomy and sentinel lymph node sampling October 2005 for a 0.6 cm invasive ductal carcinoma involving one out of 2 sentinel lymph nodes sampled, grade 3, triple-negative, treated adjuvantly with doxorubicin and cyclophosphamide 4 followed by weekly paclitaxel 7, followed by adjuvant radiation  RECURRENT DISEASE: (2) status post right breast upper outer quadrant biopsy and right axillary lymph node  biopsy 10/01/2016, both positive for a T2 N1, stage IIIB invasive ductal carcinoma, triple negative, with an MIB-1 of 50-70%  (3) status post right modified radical mastectomy 11/05/2016 showing a pT2 pN1, stage IIIB invasive ductal carcinoma, grade 3, triple negative, with negative margins  (4) not a candidate for radiation given prior history  (5) adjuvant chemotherapy consisting of  carboplatin and gemcitabine given days 1 and 8 of each 21 day cycle, for 6 cycles, starting 11/20/2016, completed 03/11/2017             (a) day 8 cycle 2 omitted because of neutropenia; Neupogen/Neulasta added  (5) HIV positivity: under care of Id Lucianne [place])  (6) Genetic testing 06/17/2017: no pathogenicmutations.Genes tested: APC, ATM, AXIN2, BARD1, BLM, BMPR1A, BRCA1, BRCA2, BRIP1, CDH1, CDK4, CDKN2A (p14ARF), CDKN2A (p16INK4a), CEBPA, CHEK2, CTNNA1, DICER1, EPCAM*, GATA2, GREM1*, HRAS, KIT, MEN1, MLH1, MSH2, MSH3, MSH6, MUTYH, NBN, NF1, PALB2, PDGFRA, PMS2, POLD1, POLE, PTEN, RAD50, RAD51C, RAD51D, RUNX1, SDHB, SDHC, SDHD, SMAD4, SMARCA4, STK11, TERC, TERT, TP53, TSC1, TSC2, VHL.The following genes were evaluated for sequence changes only: HOXB13*, NTHL1*, SDHA.              (a) A variant of uncertain significance (VUS)in a gene calledNTHL1was also noted.c.736G>A (p.Ala246Thr)  METASTATIC DISEASE: November 2019 (1) Patient seen in urgent care and ultimately ED on 04/14/2018 for shortness of breath, chest xray demonstrated Right pleural effusion.   (a) right thoracenteses on 10/21 and 11/4 results show atypical cells, non diagnostic (b) CT chest 04/22/2018 shows re-accumulation of fluid, and right pleural nodularity. (c) PET scan on 05/01/2018 shows hypermetabolic pleural based metastases, right CP angle nodal metastases, no evidence of malignancy in abdomen and pelvis. (d) bronchoscopy with biopsy by Dr. Lamonte Sakai and BAL on 05/15/2018 was non diagnostic             (e) VAT biopsy of the right pleura 05/30/2018 confirms carcinoma, triple negative             (f) [facility] shows PD-L1 positive (1% in [facility]); otherwise microsatellite stable, TMB low (5/Mb), no PIK3 mutations; other mutations suggest sensitivity to MTOR inhibitors and several TKIs  (2) Atezolizumab started 07/15/2018 given every other week             (a) PET 08/13/2018 shows tumor Right hemithorax (new baseline study)              (b) Atezo changed to Q4w starting with 11/07/2018 dose             (c) PET 11/28/2018 documents progression in the right lung and pleural area as well as lymph nodes, and a T1 skeletal metastasis             (d) atezolizumab discontinued after 12/05/2018 dose  (3) eribulin day 1 and 8 of every 21 day cycle started 12/19/2018             (a) changed to every other week with Neulsta support due to neutropenia causing treatment delays and her h/o HIV (delays due to insurance denial of onpro after 02/20/2019 dose)             (b) PET scan on 03/11/2019 showed stable to improved measurable disease, with multiple new bone lesions  (c) zoledronate given every 12 weeks starting on 03/25/2019  (4) brain MRI 04/28/2019 documents multiple intracranial metastases             (a) whole brain radiation 04/30/2019 - 05/13/2019, 30 Gy in 10 fractions  PLAN:  Labrea was unable to come to the office today but we are moving her treatment to next week.  Virgie Dad. Magrinat MD Oncology and Hematology [facility] [place] Tel. [phone]  Joylene Igo [phone]   IWilburn Mylar, am acting as scribe for Dr. Virgie Dad. Magrinat.  I, Lurline Del MD, have reviewed the above documentation for accuracy and completeness, and I agree with the above.

## 2019-05-28 NOTE — ED Notes (Signed)
Pt cleaned and external urinary catheter changed. Pt presents skin lesion on the perineal area

## 2019-05-28 NOTE — ED Notes (Signed)
PT here to eval pt.

## 2019-05-28 NOTE — H&P (Signed)
History and Physical    Kayla Price IOE:[id number] DOB: [birth year] DOA: 05/27/2019  PCP: Lauree Chandler, NP Patient coming from: Home  Chief Complaint: Generalized weakness, buttock ulcer  HPI: Kayla Price is a 63 y.o. female with medical history significant of metastatic breast cancer currently on chemo, HIV, hypertension, hyperlipidemia, CKD, anemia of chronic disease presenting to the ED for evaluation of generalized weakness and buttock ulcer.  Patient states since she left the hospital last month she has been wearing pull-ups.  Her family has noted that she has ulcers in her genital and buttock region.  These ulcers are painful whenever she tries to move.  It burns when she urinates.  She has not had any fevers.  She has not been sexually active since 2005.  No cough or shortness of breath.  She has not been eating much.  No nausea, vomiting, abdominal pain, or diarrhea.  States she was previously taking both olmesartan and hydrochlorothiazide.  She had an appointment with her PCP a few days ago and hydrochlorothiazide was stopped.  Since then, she is only taking olmesartan.   ED Course: Hypotensive with recorded blood pressure as low as 76/55.  Afebrile and not tachycardic.  No leukocytosis.  Lactic acid 2.0>2.0.  Hemoglobin 11.2, stable.  Platelet count 54,000, was 86,000 on 11/24.  Potassium 5.4.  Bicarb 19, anion gap 7.  Blood glucose 112.  BUN 147, creatinine 3.1.  UA with moderate amount of leukocytes, 11-20 WBCs, and rare bacteria.  Blood culture x2 pending. 1 L normal saline bolus ordered.  Hycet given for pain.  Review of Systems:  All systems reviewed and apart from history of presenting illness, are negative.  Past Medical History:  Diagnosis Date  . Alopecia areata 11/28/2009  . Bell's palsy   . Cancer [facility]) 2005   Breast cancer   chemotherapy and radiation  . CKD (chronic kidney disease) stage 3, GFR 30-59 ml/min 06/08/2015  . Dry eye syndrome   . Family  history of lung cancer   . Family history of non-Hodgkin's lymphoma   . Fasting hyperglycemia   . Gestational diabetes    2001  . HIP FRACTURE, RIGHT 05/06/2008  . History of kidney stones   . HIV DISEASE 03/27/2006  . HYPERLIPIDEMIA, MIXED 12/15/2007  . HYPERTENSION 03/27/2006  . 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 MENORRHAGIA, POSTMENOPAUSAL 02/03/2009  . Osteoarthritis of left knee 06/08/2015  . OSTEOARTHROSIS, LOCAL, SCND, UNSPC SITE 04/07/2007  . Personal history of chemotherapy 11/2018  . PVD 04/07/2007  . Tinea capitis   . TRIGGER FINGER 05/06/2008  . Unspecified vitamin D deficiency 08/06/2007    Past Surgical History:  Procedure Laterality Date  . BREAST LUMPECTOMY Right    2005  . CHEST TUBE INSERTION Right 05/30/2018   Procedure: INSERTION PLEURAL DRAINAGE CATHETER;  Surgeon: Grace Isaac, MD;  Location: [place];  Service: Thoracic;  Laterality: Right;  . COLONOSCOPY    . COLONOSCOPY WITH ESOPHAGOGASTRODUODENOSCOPY (EGD)    . ENDOBRONCHIAL ULTRASOUND Bilateral 05/15/2018   Procedure: ENDOBRONCHIAL ULTRASOUND;  Surgeon: Collene Gobble, MD;  Location: [facility];  Service: Cardiopulmonary;  Laterality: Bilateral;  . FINE NEEDLE ASPIRATION BIOPSY  05/15/2018   Procedure: FINE NEEDLE ASPIRATION BIOPSY;  Surgeon: Collene Gobble, MD;  Location: [facility];  Service: Cardiopulmonary;;  . FLEXIBLE BRONCHOSCOPY  05/15/2018   Procedure: FLEXIBLE BRONCHOSCOPY;  Surgeon: Collene Gobble, MD;  Location: [facility];  Service: Cardiopulmonary;;  . HYSTEROSCOPY  2006  . IR IMAGING GUIDED  PORT INSERTION  07/14/2018  . IR THORACENTESIS ASP PLEURAL SPACE W/IMG GUIDE  04/14/2018  . IR THORACENTESIS ASP PLEURAL SPACE W/IMG GUIDE  04/28/2018  . KNEE ARTHROSCOPY Left 2002  . LYMPH NODE DISSECTION  2005  . MASTECTOMY Right   . MASTECTOMY MODIFIED RADICAL Right 11/05/2016   Procedure: RIGHT MASTECTOMY MODIFIED RADICAL;  Surgeon: Fanny Skates, MD;  Location: [place];  Service:  General;  Laterality: Right;  . MODIFIED RADICAL MASTECTOMY Right 11/05/2016  . placement   of port-a-cath    . PLEURAL BIOPSY Right 05/30/2018   Procedure: PLEURAL BIOPSY;  Surgeon: Grace Isaac, MD;  Location: [place];  Service: Thoracic;  Laterality: Right;  . PLEURAL EFFUSION DRAINAGE Right 05/30/2018   Procedure: DRAINAGE OF PLEURAL EFFUSION;  Surgeon: Grace Isaac, MD;  Location: [place];  Service: Thoracic;  Laterality: Right;  . PORT-A-CATH REMOVAL  2006   insertion 2005  . PORT-A-CATH REMOVAL N/A 03/29/2017   Procedure: REMOVAL PORT-A-CATH;  Surgeon: Fanny Skates, MD;  Location: [place];  Service: General;  Laterality: N/A;  . PORTACATH PLACEMENT Right 11/05/2016   Procedure: INSERTION PORT-A-CATH WITH ULTRA SOUND;  Surgeon: Fanny Skates, MD;  Location: [place];  Service: General;  Laterality: Right;  . removal of port a cath  12/2014  . TALC PLEURODESIS Right 05/30/2018   Procedure: Pietro Cassis;  Surgeon: Grace Isaac, MD;  Location: [place];  Service: Thoracic;  Laterality: Right;  . THYROID SURGERY  2009   Ablation   . TOTAL KNEE ARTHROPLASTY Left 02/06/2016   Procedure: TOTAL KNEE ARTHROPLASTY;  Surgeon: Frederik Pear, MD;  Location: [place];  Service: Orthopedics;  Laterality: Left;  . TUBAL LIGATION    . VIDEO ASSISTED THORACOSCOPY Right 05/30/2018   Procedure: VIDEO ASSISTED THORACOSCOPY;  Surgeon: Grace Isaac, MD;  Location: [place];  Service: Thoracic;  Laterality: Right;     reports that she has never smoked. She has never used smokeless tobacco. She reports that she does not drink alcohol or use drugs.  Allergies  Allergen Reactions  . Lisinopril Anaphylaxis and Swelling    Swelling of tongue and mouth 11/05/16- tolerates Olmesartan  . Pepcid [Famotidine] Other (See Comments)    PPI H2, BLOCKERS LOWER GASTRIC PH WHICH WOULD LEAD TO SUBTHERAPEUTIC RILPIVIRINE LEVELS AND POTENTIAL VIROLOGICAL FAILURE WITH RESISTANCE  . Prilosec [Omeprazole] Other (See  Comments)    PPI H2, BLOCKERS LOWER GASTRIC PH WHICH WOULD LEAD TO SUBTHERAPEUTIC RILPIVIRINE LEVELS AND POTENTIAL VIROLOGICAL FAILURE WITH RESISTANCE  . Tums [Calcium Carbonate Antacid] Other (See Comments)    TUMS ANTACIDS CAN LOWER GASTRIC PH WHICH COULD  LEAD TO SUBTHERAPEUTIC RILPIVIRINE LEVELS AND POTENTIAL VIROLOGICAL FAILURE WITH RESISTANCE TUMS CAN BE GIVEN BUT NEED CONSULT WITH [facility] PHARM[facility]Y RE TIMING. I PREFER HER TO AVOID ALL TOGETHER    Family History  Problem Relation Age of Onset  . Heart attack Brother        Massive MI in 79s  . Stroke Brother        CAD  . Kidney disease Mother   . Stroke Mother   . Diabetes Mother   . Liver disease Sister   . COPD Sister        had I-131 rx of hyperthyroidism  . Diabetes Sister   . Stroke Sister   . Lung cancer Paternal Uncle        hx smoking  . Cancer Cousin   . Non-Hodgkin's lymphoma Cousin 25       cancer  x3, in prostate and lung- unsure if met/spread or if primaries  . Heart failure Father   . Heart disease Father   . Arthritis Father   . Sarcoidosis Sister     Prior to Admission medications   Medication Sig Start Date End Date Taking? Authorizing Provider  acetaminophen (TYLENOL) 500 MG tablet Take 500 mg by mouth every 6 (six) hours as needed (inflammation).    [provider]  dexamethasone (DECADRON) 4 MG tablet Take one tab by mouth twice daily for 7 days; then one tab daily for 7 days; then one-half tab for 7 days; then stop 05/12/19   Gery Pray, MD  feeding supplement, ENSURE ENLIVE, (ENSURE ENLIVE) LIQD Take 237 mLs by mouth 3 (three) times daily between meals. 05/01/19   Guilford Shi, MD  HYDROcodone-acetaminophen (NORCO/VICODIN) 5-325 MG tablet TAKE ONE TABLET BY MOUTH 4 TIMES A DAY AS INSTRUCTED 05/01/19   Magrinat, Virgie Dad, MD  hydrocortisone cream 1 % Apply 1 application topically daily as needed for itching.    [provider]  JULUCA 50-25 MG TABS TAKE 1 TABLET BY MOUTH DAILY WITH  BREAKFAST. TAKE WITH THE PREZCOBIX. 04/16/19   Tommy Medal, Lavell Islam, MD  levothyroxine (SYNTHROID) 200 MCG tablet Take 1 tablet (200 mcg total) by mouth daily before breakfast. 02/18/19   Renato Shin, MD  lidocaine-prilocaine (EMLA) cream APPLY 1 APPLICATION TOPICALLY AS NEEDED. APPLY TO PORT SITE 1 HOUR PRIOR TO ACCESS 04/24/19   Magrinat, Virgie Dad, MD  Loratadine 10 MG CAPS Take 1 capsule by mouth as needed.     [provider]  Menthol, Topical Analgesic, (BENGAY EX) Apply 1 application topically daily as needed (muscle pain).    [provider]  methocarbamol (ROBAXIN) 500 MG tablet Take 1 tablet (500 mg total) by mouth every 8 (eight) hours as needed for muscle spasms. 12/08/18   Magrinat, Virgie Dad, MD  olmesartan (BENICAR) 40 MG tablet Take 1 tablet (40 mg total) by mouth daily. 05/15/19   Lauree Chandler, NP  olmesartan-hydrochlorothiazide (BENICAR HCT) 40-25 MG tablet TAKE 1 TABLET BY MOUTH DAILY. 05/01/19   Magrinat, Virgie Dad, MD  PREZCOBIX 800-150 MG tablet TAKE 1 TABLET BY MOUTH DAILY. SWALLOW WHOLE. DO NOT CRUSH, BREAK OR CHEW TABLETS. TAKE WITH FOOD. 04/16/19   Tommy Medal, Lavell Islam, MD  Propylene Glycol (SYSTANE BALANCE OP) Place 1 drop into both eyes 4 (four) times daily as needed.    [provider]  SELZENTRY 150 MG tablet TAKE ONE TABLET BY MOUTH TWICE DAILY 01/02/19   Tommy Medal, Lavell Islam, MD  Study - REPRIEVE [phone] - pitavastatin 4 mg or placebo tablet (PI-[place]) Take 1 tablet (4 mg total) by mouth daily. 12/07/15   Truman Hayward, MD    Physical Exam: Vitals:   05/28/19 0515 05/28/19 0530 05/28/19 0545 05/28/19 0600  BP: (!) 90/58 (!) 89/61 (!) 80/57 96/61  Pulse:      Resp: _0 Temp:      TempSrc:      SpO2:        Physical Exam  Constitutional: She is oriented to person, place, and time. She appears well-developed and well-nourished. No distress.  HENT:  Head: Normocephalic.  Eyes: Right eye exhibits no discharge. Left eye  exhibits no discharge.  Neck: Neck supple.  Cardiovascular: Normal rate, regular rhythm and intact distal pulses.  Pulmonary/Chest: Effort normal and breath sounds normal. No respiratory distress. She has no wheezes. She has  no rales.  Abdominal: Soft. Bowel sounds are normal. She exhibits no distension. There is no abdominal tenderness. There is no guarding.  Genitourinary:    Genitourinary Comments: Female chaperone present at bedside Examination very limited as patient was in pain. Areas of superficial skin ulceration/skin breakdown on labia majora and groin region bilaterally.  No erythema noted.  No areas of fluctuance.  Slight foul-smelling drainage.  Not able to appreciate any ulcers on labia minora and no obvious vaginal discharge, again examination very limited as patient could not cooperate secondary to pain.  Noted to have superficial skin breakdown in the right buttock region.   Musculoskeletal:        General: No edema.  Neurological: She is alert and oriented to person, place, and time.  Skin: Skin is warm and dry. She is not diaphoretic.     Labs on Admission: I have personally reviewed following labs and imaging studies  CBC: Recent Labs  Lab 05/27/19 2327  WBC 6.4  NEUTROABS 5.9  HGB 11.2*  HCT 34.4*  MCV 95.6  PLT 54*   Basic Metabolic Panel: Recent Labs  Lab 05/27/19 2327  NA 138  K 5.4*  CL 112*  CO2 19*  GLUCOSE 112*  BUN 147*  CREATININE 3.14*  CALCIUM 9.3   GFR: Estimated Creatinine Clearance: 20.9 mL/min (A) (by C-G formula based on SCr of 3.14 mg/dL (H)). Liver Function Tests: Recent Labs  Lab 05/27/19 2327  AST 26  ALT 46*  ALKPHOS 48  BILITOT 1.0  PROT 6.2*  ALBUMIN 3.3*   No results for input(s): LIPASE, AMYLASE in the last 168 hours. No results for input(s): AMMONIA in the last 168 hours. Coagulation Profile: No results for input(s): INR, PROTIME in the last 168 hours. Cardiac Enzymes: No results for input(s): CKTOTAL, CKMB,  CKMBINDEX, TROPONINI in the last 168 hours. BNP (last 3 results) No results for input(s): PROBNP in the last 8760 hours. HbA1C: No results for input(s): HGBA1C in the last 72 hours. CBG: No results for input(s): GLUCAP in the last 168 hours. Lipid Profile: No results for input(s): CHOL, HDL, LDLCALC, TRIG, CHOLHDL, LDLDIRECT in the last 72 hours. Thyroid Function Tests: No results for input(s): TSH, T4TOTAL, FREET4, T3FREE, THYROIDAB in the last 72 hours. Anemia Panel: No results for input(s): VITAMINB12, FOLATE, FERRITIN, TIBC, IRON, RETICCTPCT in the last 72 hours. Urine analysis:    Component Value Date/Time   COLORURINE YELLOW 05/28/2019 0025   APPEARANCEUR HAZY (A) 05/28/2019 0025   LABSPEC 1.014 05/28/2019 0025   PHURINE 5.0 05/28/2019 0025   GLUCOSEU NEGATIVE 05/28/2019 0025   GLUCOSEU NEG mg/dL 07/23/2006 2113   HGBUR MODERATE (A) 05/28/2019 0025   BILIRUBINUR NEGATIVE 05/28/2019 0025   KETONESUR NEGATIVE 05/28/2019 0025   PROTEINUR NEGATIVE 05/28/2019 0025   UROBILINOGEN 1 07/23/2006 2113   NITRITE NEGATIVE 05/28/2019 0025   LEUKOCYTESUR MODERATE (A) 05/28/2019 0025    Radiological Exams on Admission: No results found.  EKG: Independently reviewed.  Sinus rhythm, no significant change since prior tracing.  Assessment/Plan Principal Problem:   AKI (acute kidney injury) ([place]) Active Problems:   BREAST CANCER, HX OF   Hyperkalemia   UTI (urinary tract infection)   Hypotension   AKI on CKD stage III Suspect prerenal due to decreased p.o. intake/dehydration and diuretic/ARB use.   BUN 147, creatinine 3.1.  Baseline creatinine 1.3-1.4. -IV fluid hydration -Urine sodium, creatinine -Renal ultrasound -Hold diuretic/ARB.  Avoid nephrotoxic agents/contrast. -Continue to monitor renal function and urine output  Mild  hyperkalemia Likely related to AKI and ARB use.  Potassium 5.4. -Management of AKI as above -Hold ARB -Continue to monitor electrolytes  Mild  normal anion gap metabolic acidosis Bicarb 19, anion gap 7.  Likely related to AKI and diuretic use. -Management of AKI as mentioned above -Hold diuretic -Continue to monitor  UTI UA with moderate amount of leukocytes, 11-20 WBCs, and rare bacteria. -Ceftriaxone -Urine culture  Hypotension Hypotensive with recorded blood pressure as low as 76/55.  Afebrile and not tachycardic.  No leukocytosis.  Lactic acid 2.0>2.0.  Does have a UTI but do not feel hypotension is related to sepsis. Hypotension likely related to home antihypertensive/diuretic use in the setting of decreased p.o. intake/dehydration. -1 L IV fluid bolus ordered in the ED. continue to monitor blood pressure closely and give additional boluses as needed. -Hold diuretic/antihypertensive -Continue to monitor blood pressure closely -Blood culture x2  Superficial skin breakdown/ulcers in the genital/groin/buttocks region, ?intertrigo No signs of cellulitis.  Abscess less likely as no fluctuance appreciated on exam.  Foul-smelling drainage noted in the groin region, ?intertrigo.  Skin breakdown likely related to increased moisture in this area as patient wears pull-ups.  On limited examination, no obvious ulcers noted on labia minora to suggest genital herpes.  No obvious vaginal discharge.  Patient is not sexually active. -Receiving ceftriaxone for UTI as mentioned above -Wound care consult -Keep these areas clean and dry -Nystatin powder -Norco as needed for pain -Tylenol as needed  HIV Followed by infectious disease.  Last CD4 count 475 on 10/26.  Hypertension -Hold home irbesartan and hydrochlorothiazide given AKI and hypotension  Hypothyroidism TSH normal on 04/03/2019. -Continue Synthroid  Anemia of chronic disease -Hemoglobin 11.2, stable.  No signs of active bleeding.  Metastatic breast cancer currently on chemo -Oncology follow-up  Worsening thrombocytopenia Possibly related to chemo. Platelet count 54,000,  was 86,000 on 11/24. No signs of active bleeding. -Continue to monitor CBC -Avoid anticoagulation/antiplatelet agents -Consider consulting oncology in a.m.  DVT prophylaxis: SCDs Code Status: Patient wishes to be DNR. Family Communication: No family available. Disposition Plan: Anticipate discharge after clinical improvement. Consults called: None Admission status: It is my clinical opinion that referral for OBSERVATION is reasonable and necessary in this patient based on the above information provided. The aforementioned taken together are felt to place the patient at high risk for further clinical deterioration. However it is anticipated that the patient may be medically stable for discharge from the hospital within 24 to 48 hours.  The medical decision making on this patient was of high complexity and the patient is at high risk for clinical deterioration, therefore this is a level 3 visit.  Shela Leff MD [facility] Pager [phone]  If 7PM-7AM, please contact night-coverage [email or web address] Password [facility]  05/28/2019, 6:28 AM

## 2019-05-28 NOTE — ED Provider Notes (Signed)
[facility] EMERGENCY DEPARTMENT Provider Note   CSN: [id number] Arrival date & time: 05/27/19  2309     History   Chief Complaint Chief Complaint  Patient presents with  . Decubitus Ulcer/Cancer patient    HPI Kayla Price is a 63 y.o. female.     Patient to ED from home where she is cared for by her daughter. She reports painful sores in bilateral lower buttocks and outer vaginal labia that are significantly tender. The pain intensifies when she urinates and urine comes into contact with the areas. No urethral burning with urination. No fever, nausea, vomiting, chest pain, SOB, cough or congestion. She reports a general weakness and loss of appetite and is getting progressively worse. She has a history of metastatic breast cancer ([place]), HIV, thyroid disease (hypo), HTN, HLD.   The history is provided by the patient. No language interpreter was used.    Past Medical History:  Diagnosis Date  . Alopecia areata 11/28/2009  . Bell's palsy   . Cancer [facility]) 2005   Breast cancer   chemotherapy and radiation  . CKD (chronic kidney disease) stage 3, GFR 30-59 ml/min 06/08/2015  . Dry eye syndrome   . Family history of lung cancer   . Family history of non-Hodgkin's lymphoma   . Fasting hyperglycemia   . Gestational diabetes    2001  . HIP FRACTURE, RIGHT 05/06/2008  . History of kidney stones   . HIV DISEASE 03/27/2006  . HYPERLIPIDEMIA, MIXED 12/15/2007  . HYPERTENSION 03/27/2006  . 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 MENORRHAGIA, POSTMENOPAUSAL 02/03/2009  . Osteoarthritis of left knee 06/08/2015  . OSTEOARTHROSIS, LOCAL, SCND, UNSPC SITE 04/07/2007  . Personal history of chemotherapy 11/2018  . PVD 04/07/2007  . Tinea capitis   . TRIGGER FINGER 05/06/2008  . Unspecified vitamin D deficiency 08/06/2007    Patient Active Problem List   Diagnosis Date Noted  . Brain metastasis ([place]) 04/28/2019  . Hypothyroidism 04/28/2019  . Anemia of  chronic disease 04/28/2019  . Encounter for antineoplastic chemotherapy 02/06/2019  . Port-A-Cath in place 08/01/2018  . Goals of care, counseling/discussion 06/26/2018  . Recurrent right pleural effusion 05/30/2018  . Malignant pleural effusion 05/19/2018  . Hilar adenopathy 05/15/2018  . Pleural effusion on right 04/25/2018  . Seasonal allergic rhinitis due to pollen 01/16/2018  . Aortic atherosclerosis ([place]) 11/11/2017  . Morbid obesity with BMI of 40.0-44.9, adult ([place]) 11/11/2017  . Genetic testing 06/27/2017  . Family history of non-Hodgkin's lymphoma   . Family history of lung cancer   . Endometrial thickening on ultrasound 01/25/2017  . Metastatic breast cancer ([place]) 11/05/2016  . Malignant neoplasm of upper-outer quadrant of right breast in female, estrogen receptor negative ([place]) 10/09/2016  . Primary localized osteoarthritis of left knee 02/06/2016  . Primary osteoarthritis of left knee 02/05/2016  . Osteoarthritis of left knee 06/08/2015  . CKD (chronic kidney disease), stage III 06/08/2015  . Vitamin D deficiency 09/23/2014  . Acute renal insufficiency 04/23/2014  . 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 Bell's palsy 12/04/2012  . Contact dermatitis 08/29/2011  . Dry eyes 08/20/2011  . Dry mouth 08/20/2011  . Neuropathy 04/09/2011  . Insomnia 04/09/2011  . Obesities, morbid ([place]) 09/27/2010  . Endometrial polyp 12/22/2009  . ALOPECIA AREATA 11/28/2009  . MENORRHAGIA, POSTMENOPAUSAL 02/03/2009  . ABNORMAL GLANDULAR PAPANICOLAOU SMEAR OF CERVIX 11/04/2008  . ALOPECIA 05/06/2008  . Trigger finger, acquired 05/06/2008  . HIP FRACTURE, RIGHT 05/06/2008  . ARTHROSCOPY, LEFT KNEE, HX OF  02/03/2008  . HYPERLIPIDEMIA, MIXED 12/15/2007  . HAND PAIN, RIGHT 12/15/2007  . Other abnormal glucose 12/15/2007  . UNSPECIFIED VITAMIN D DEFICIENCY 08/06/2007  . TINEA CAPITIS 08/04/2007  . CNTC DERMATITIS&OTH ECZEMA DUE OTH CHEM PRODUCTS 08/04/2007  . PVD 04/07/2007  .  Secondary localized osteoarthrosis 04/07/2007  . Human immunodeficiency virus (HIV) disease ([place]) 03/27/2006  . HTN (hypertension) 03/27/2006  . BREAST CANCER, HX OF 03/27/2006    Past Surgical History:  Procedure Laterality Date  . BREAST LUMPECTOMY Right    2005  . CHEST TUBE INSERTION Right 05/30/2018   Procedure: INSERTION PLEURAL DRAINAGE CATHETER;  Surgeon: Grace Isaac, MD;  Location: [facility];  Service: Thoracic;  Laterality: Right;  . COLONOSCOPY    . COLONOSCOPY WITH ESOPHAGOGASTRODUODENOSCOPY (EGD)    . ENDOBRONCHIAL ULTRASOUND Bilateral 05/15/2018   Procedure: ENDOBRONCHIAL ULTRASOUND;  Surgeon: Collene Gobble, MD;  Location: [facility];  Service: Cardiopulmonary;  Laterality: Bilateral;  . FINE NEEDLE ASPIRATION BIOPSY  05/15/2018   Procedure: FINE NEEDLE ASPIRATION BIOPSY;  Surgeon: Collene Gobble, MD;  Location: [facility];  Service: Cardiopulmonary;;  . FLEXIBLE BRONCHOSCOPY  05/15/2018   Procedure: FLEXIBLE BRONCHOSCOPY;  Surgeon: Collene Gobble, MD;  Location: [facility];  Service: Cardiopulmonary;;  . HYSTEROSCOPY  2006  . IR IMAGING GUIDED PORT INSERTION  07/14/2018  . IR THORACENTESIS ASP PLEURAL SPACE W/IMG GUIDE  04/14/2018  . IR THORACENTESIS ASP PLEURAL SPACE W/IMG GUIDE  04/28/2018  . KNEE ARTHROSCOPY Left 2002  . LYMPH NODE DISSECTION  2005  . MASTECTOMY Right   . MASTECTOMY MODIFIED RADICAL Right 11/05/2016   Procedure: RIGHT MASTECTOMY MODIFIED RADICAL;  Surgeon: Fanny Skates, MD;  Location: [facility];  Service: General;  Laterality: Right;  . MODIFIED RADICAL MASTECTOMY Right 11/05/2016  . placement   of port-a-cath    . PLEURAL BIOPSY Right 05/30/2018   Procedure: PLEURAL BIOPSY;  Surgeon: Grace Isaac, MD;  Location: [place];  Service: Thoracic;  Laterality: Right;  . PLEURAL EFFUSION DRAINAGE Right 05/30/2018   Procedure: DRAINAGE OF PLEURAL EFFUSION;  Surgeon: Grace Isaac, MD;  Location: [facility];  Service: Thoracic;  Laterality: Right;   . PORT-A-CATH REMOVAL  2006   insertion 2005  . PORT-A-CATH REMOVAL N/A 03/29/2017   Procedure: REMOVAL PORT-A-CATH;  Surgeon: Fanny Skates, MD;  Location: [facility];  Service: General;  Laterality: N/A;  . PORTACATH PLACEMENT Right 11/05/2016   Procedure: INSERTION PORT-A-CATH WITH ULTRA SOUND;  Surgeon: Fanny Skates, MD;  Location: [place];  Service: General;  Laterality: Right;  . removal of port a cath  12/2014  . TALC PLEURODESIS Right 05/30/2018   Procedure: Pietro Cassis;  Surgeon: Grace Isaac, MD;  Location: [facility];  Service: Thoracic;  Laterality: Right;  . THYROID SURGERY  2009   Ablation   . TOTAL KNEE ARTHROPLASTY Left 02/06/2016   Procedure: TOTAL KNEE ARTHROPLASTY;  Surgeon: Frederik Pear, MD;  Location: [facility];  Service: Orthopedics;  Laterality: Left;  . TUBAL LIGATION    . VIDEO ASSISTED THORACOSCOPY Right 05/30/2018   Procedure: VIDEO ASSISTED THORACOSCOPY;  Surgeon: Grace Isaac, MD;  Location: [facility] OR;  Service: Thoracic;  Laterality: Right;     OB History    Gravida  4   Para  2   Term  2   Preterm      AB  2   Living  2     SAB      TAB  2   Ectopic  Multiple      Live Births               Home Medications    Prior to Admission medications   Medication Sig Start Date End Date Taking? Authorizing Provider  acetaminophen (TYLENOL) 500 MG tablet Take 500 mg by mouth every 6 (six) hours as needed (inflammation).    [provider]  dexamethasone (DECADRON) 4 MG tablet Take one tab by mouth twice daily for 7 days; then one tab daily for 7 days; then one-half tab for 7 days; then stop 05/12/19   Gery Pray, MD  feeding supplement, ENSURE ENLIVE, (ENSURE ENLIVE) LIQD Take 237 mLs by mouth 3 (three) times daily between meals. 05/01/19   [place] Shi, MD  HYDROcodone-acetaminophen (NORCO/VICODIN) 5-325 MG tablet TAKE ONE TABLET BY MOUTH 4 TIMES A DAY AS INSTRUCTED 05/01/19   [place], Virgie Dad, MD  hydrocortisone cream 1 %  Apply 1 application topically daily as needed for itching.    [provider]  JULUCA 50-25 MG TABS TAKE 1 TABLET BY MOUTH DAILY WITH BREAKFAST. TAKE WITH THE PREZCOBIX. 04/16/19   Tommy Medal, Lavell Islam, MD  levothyroxine (SYNTHROID) 200 MCG tablet Take 1 tablet (200 mcg total) by mouth daily before breakfast. 02/18/19   Renato Shin, MD  lidocaine-prilocaine (EMLA) cream APPLY 1 APPLICATION TOPICALLY AS NEEDED. APPLY TO PORT SITE 1 HOUR PRIOR TO ACCESS 04/24/19   [place], Virgie Dad, MD  Loratadine 10 MG CAPS Take 1 capsule by mouth as needed.     [provider]  Menthol, Topical Analgesic, (BENGAY EX) Apply 1 application topically daily as needed (muscle pain).    [provider]  methocarbamol (ROBAXIN) 500 MG tablet Take 1 tablet (500 mg total) by mouth every 8 (eight) hours as needed for muscle spasms. 12/08/18   [place], Virgie Dad, MD  olmesartan (BENICAR) 40 MG tablet Take 1 tablet (40 mg total) by mouth daily. 05/15/19   Lauree Chandler, NP  olmesartan-hydrochlorothiazide (BENICAR HCT) 40-25 MG tablet TAKE 1 TABLET BY MOUTH DAILY. 05/01/19   [place], Virgie Dad, MD  PREZCOBIX 800-150 MG tablet TAKE 1 TABLET BY MOUTH DAILY. SWALLOW WHOLE. DO NOT CRUSH, BREAK OR CHEW TABLETS. TAKE WITH FOOD. 04/16/19   Tommy Medal, Lavell Islam, MD  Propylene Glycol (SYSTANE BALANCE OP) Place 1 drop into both eyes 4 (four) times daily as needed.    [provider]  SELZENTRY 150 MG tablet TAKE ONE TABLET BY MOUTH TWICE DAILY 01/02/19   Tommy Medal, Lavell Islam, MD  Study - REPRIEVE [phone] - pitavastatin 4 mg or placebo tablet ([place]) Take 1 tablet (4 mg total) by mouth daily. 12/07/15   Truman Hayward, MD    Family History Family History  Problem Relation Age of Onset  . Heart attack Brother        Massive MI in 7s  . Stroke Brother        CAD  . Kidney disease Mother   . Stroke Mother   . Diabetes Mother   . Liver disease Sister   . COPD Sister        had I-131 rx  of hyperthyroidism  . Diabetes Sister   . Stroke Sister   . Lung cancer Paternal Uncle        hx smoking  . Cancer Cousin   . Non-Hodgkin's lymphoma Cousin 25       cancer x3, in prostate and lung- unsure if met/spread or if primaries  .  Heart failure Father   . Heart disease Father   . Arthritis Father   . Sarcoidosis Sister     Social History Social History   Tobacco Use  . Smoking status: Never Smoker  . Smokeless tobacco: Never Used  Substance Use Topics  . Alcohol use: No  . Drug use: No     Allergies   Lisinopril, Pepcid [famotidine], Prilosec [omeprazole], and Tums [calcium carbonate antacid]   Review of Systems Review of Systems  Constitutional: Positive for appetite change. Negative for chills and fever.  HENT: Negative.  Negative for congestion.   Respiratory: Negative.  Negative for cough and shortness of breath.   Cardiovascular: Negative.  Negative for chest pain.  Gastrointestinal: Negative.  Negative for abdominal pain, nausea and vomiting.  Genitourinary: Negative for dysuria.  Musculoskeletal: Negative.  Negative for myalgias.  Skin: Positive for color change and wound.  Neurological: Negative.  Negative for dizziness and light-headedness.     Physical Exam Updated Vital Signs BP (!) 93/46   Pulse (!) 38   Temp 97.9 F (36.6 C) (Oral)   Resp 17   LMP 11/06/2003   SpO2 100%   Physical Exam Vitals signs and nursing note reviewed.  Constitutional:      General: She is not in acute distress.    Appearance: She is well-developed.  HENT:     Head: Normocephalic.  Neck:     Musculoskeletal: Normal range of motion and neck supple.  Cardiovascular:     Rate and Rhythm: Normal rate and regular rhythm.     Heart sounds: No murmur.  Pulmonary:     Effort: Pulmonary effort is normal.     Breath sounds: Normal breath sounds. No wheezing, rhonchi or rales.  Abdominal:     General: Bowel sounds are normal.     Palpations: Abdomen is soft.      Tenderness: There is no abdominal tenderness. There is no guarding or rebound.  Musculoskeletal: Normal range of motion.  Skin:    General: Skin is warm and dry.     Comments: Areas of redness to inferior buttocks. No skin breakdown or ulceration. Areas are significantly tender to touch. No induration or fluctuance.   Neurological:     Mental Status: She is alert and oriented to person, place, and time.     Sensory: No sensory deficit.      ED Treatments / Results  Labs (all labs ordered are listed, but only abnormal results are displayed) Labs Reviewed  CBC WITH DIFFERENTIAL/PLATELET - Abnormal; Notable for the following components:      Result Value   RBC 3.60 (*)    Hemoglobin 11.2 (*)    HCT 34.4 (*)    RDW 17.5 (*)    Platelets 54 (*)    Lymphs Abs 0.3 (*)    All other components within normal limits  COMPREHENSIVE METABOLIC PANEL - Abnormal; Notable for the following components:   Potassium 5.4 (*)    Chloride 112 (*)    CO2 19 (*)    Glucose, Bld 112 (*)    BUN 147 (*)    Creatinine, Ser 3.14 (*)    Total Protein 6.2 (*)    Albumin 3.3 (*)    ALT 46 (*)    GFR calc non Af Amer 15 (*)    GFR calc Af Amer 17 (*)    All other components within normal limits  LACTIC ACID, PLASMA - Abnormal; Notable for the following components:   Lactic  Acid, Venous 2.0 (*)    All other components within normal limits  URINALYSIS, ROUTINE W REFLEX MICROSCOPIC - Abnormal; Notable for the following components:   APPearance HAZY (*)    Hgb urine dipstick MODERATE (*)    Leukocytes,Ua MODERATE (*)    Bacteria, UA RARE (*)    All other components within normal limits  LACTIC ACID, PLASMA    EKG None  Radiology No results found.  Procedures Procedures (including critical care time)  Medications Ordered in ED Medications - No data to display   Initial Impression / Assessment and Plan / ED Course  I have reviewed the triage vital signs and the nursing notes.  Pertinent  labs & imaging results that were available during my care of the patient were reviewed by me and considered in my medical decision making (see chart for details).        Patient to ED for evaluation of sore areas in groin area and generalized weakness No other complaint.  The patient is awake and alert, in NAD. Oriented, pleasant.   With weakness, hypotension, mild tachycardia, consider sepsis but she denies fever, cough, vomiting, diarrhea. Pain with urination seems to be associated with raw areas. Do not feel the skin irritation is a source of infection.   Labs significantly abnormal, including BUN 147, Cr 3.14. Noted that on 11/24 these levels were 90 and 1.81. She has a history of CKD, however, baseline GFR seems to be around 30.  Patient's blood pressure has begun to go lower, most notably 84/36, with HR now bradycardic at 57. The patient denies lightheadedness (here or at home), chest pain, breathing difficulty, or feeling any weaker. IVF's ordered - will start 1 L bolus followed by 100 cc/hr infusion.   She will require admission to determine the cause of AKI and correct. The patient was updated on plan and is amenable to admission. Her daughter, Orland Dec was contacted and agrees with management. She requested I notify Dr. Jana Hakim that she is being admitted and a message was sent to him via the [facility] email.  Final Clinical Impressions(s) / ED Diagnoses   Final diagnoses:  None   1. AKI 2. Weakness 3. Hypotension  ED Discharge Orders    None       Charlann Lange, Hershal Coria 05/28/19 0503    Mesner, Corene Cornea, MD 05/28/19 [phone]

## 2019-05-28 NOTE — ED Notes (Signed)
Restful, on the phone without needs voiced.

## 2019-05-28 NOTE — ED Notes (Signed)
Pt resting at this time, does request additional pain meds.  States her wounds are in her groin.  Redness and maceration noted to folds.  Will medicated with pain meds and roll to assess.

## 2019-05-28 NOTE — Consult Note (Signed)
WOC Nurse Consult Note: Patient receiving care in [facility] ED26.  Consult completed remotely after review of record. Reason for Consult: skin ulcerations in the genital and buttock region Wound type: Per the from Dr. Ninetta Lights at [phone] this morning, these areas may represent superficial breakdown related to use of pull ups and intertrigo.  Nystatin powder has been ordered for the areas.  It is reasonable to allow this approach until 05/29/19 then reassess to determine efficacy in initiating healing to the areas. Additional care measures to maintain the area dry and limit the effects of moisture to the areas include:  Use of Dermatherapy linen directly against the skin, and an air mattress.  I will order these two additional care measures and reassess on 05/29/19.  Val Riles, RN, MSN, CWOCN, CNS-BC, pager [phone]

## 2019-05-29 ENCOUNTER — Inpatient Hospital Stay: Payer: Medicare HMO | Attending: Adult Health

## 2019-05-29 ENCOUNTER — Ambulatory Visit (HOSPITAL_COMMUNITY): Admission: RE | Admit: 2019-05-29 | Payer: Medicare HMO | Source: Ambulatory Visit

## 2019-05-29 ENCOUNTER — Telehealth: Payer: Self-pay | Admitting: Oncology

## 2019-05-29 ENCOUNTER — Inpatient Hospital Stay (HOSPITAL_BASED_OUTPATIENT_CLINIC_OR_DEPARTMENT_OTHER): Payer: Medicare HMO | Admitting: Oncology

## 2019-05-29 ENCOUNTER — Inpatient Hospital Stay: Payer: Medicare HMO

## 2019-05-29 DIAGNOSIS — Z171 Estrogen receptor negative status [ER-]: Secondary | ICD-10-CM

## 2019-05-29 DIAGNOSIS — C50919 Malignant neoplasm of unspecified site of unspecified female breast: Secondary | ICD-10-CM

## 2019-05-29 DIAGNOSIS — Z853 Personal history of malignant neoplasm of breast: Secondary | ICD-10-CM

## 2019-05-29 DIAGNOSIS — C7931 Secondary malignant neoplasm of brain: Secondary | ICD-10-CM

## 2019-05-29 DIAGNOSIS — C50411 Malignant neoplasm of upper-outer quadrant of right female breast: Secondary | ICD-10-CM

## 2019-05-29 LAB — BASIC METABOLIC PANEL
Anion gap: 7 (ref 5–15)
BUN: 72 mg/dL — ABNORMAL HIGH (ref 8–23)
CO2: 18 mmol/L — ABNORMAL LOW (ref 22–32)
Calcium: 8.5 mg/dL — ABNORMAL LOW (ref 8.9–10.3)
Chloride: 119 mmol/L — ABNORMAL HIGH (ref 98–111)
Creatinine, Ser: 1.39 mg/dL — ABNORMAL HIGH (ref 0.44–1.00)
GFR calc Af Amer: 47 mL/min — ABNORMAL LOW (ref >60)
GFR calc non Af Amer: 40 mL/min — ABNORMAL LOW (ref >60)
Glucose, Bld: 104 mg/dL — ABNORMAL HIGH (ref 70–99)
Potassium: 3.9 mmol/L (ref 3.5–5.1)
Sodium: 144 mmol/L (ref 135–145)

## 2019-05-29 LAB — CBC
HCT: 27.2 % — ABNORMAL LOW (ref 36.0–46.0)
Hemoglobin: 8.8 g/dL — ABNORMAL LOW (ref 12.0–15.0)
MCH: 31.3 pg (ref 26.0–34.0)
MCHC: 32.4 g/dL (ref 30.0–36.0)
MCV: 96.8 fL (ref 80.0–100.0)
Platelets: 40 10*3/uL — ABNORMAL LOW (ref 150–400)
RBC: 2.81 MIL/uL — ABNORMAL LOW (ref 3.87–5.11)
RDW: 17.8 % — ABNORMAL HIGH (ref 11.5–15.5)
WBC: 2.7 10*3/uL — ABNORMAL LOW (ref 4.0–10.5)
nRBC: 0 % (ref 0.0–0.2)

## 2019-05-29 MED ORDER — FLUCONAZOLE 200 MG PO TABS
400.0000 mg | ORAL_TABLET | Freq: Every day | ORAL | Status: DC
  Administered 2019-05-29 – 2019-05-31 (×3): 400 mg via ORAL
  Filled 2019-05-29 (×4): qty 2

## 2019-05-29 MED ORDER — SODIUM CHLORIDE 0.9% FLUSH
10.0000 mL | Freq: Two times a day (BID) | INTRAVENOUS | Status: DC
  Administered 2019-05-30 – 2019-05-31 (×4): 10 mL

## 2019-05-29 MED ORDER — ENSURE ENLIVE PO LIQD
237.0000 mL | Freq: Three times a day (TID) | ORAL | Status: DC
  Administered 2019-05-29 (×2): 237 mL via ORAL

## 2019-05-29 MED ORDER — SODIUM CHLORIDE 0.9% FLUSH: 10.0000 mL | INTRAVENOUS | Status: DC | PRN

## 2019-05-29 MED ORDER — SODIUM CHLORIDE 0.9 % IV BOLUS
500.0000 mL | Freq: Once | INTRAVENOUS | Status: AC
  Administered 2019-05-29: 500 mL via INTRAVENOUS

## 2019-05-29 MED ORDER — CHLORHEXIDINE GLUCONATE CLOTH 2 % EX PADS
6.0000 | MEDICATED_PAD | Freq: Every day | CUTANEOUS | Status: DC
  Administered 2019-05-29 – 2019-05-31 (×3): 6 via TOPICAL

## 2019-05-29 NOTE — Progress Notes (Signed)
Initial Nutrition Assessment  DOCUMENTATION CODES:   Obesity unspecified  INTERVENTION:   - Recommend obtaining SLP evaluation  - Ensure Enlive po TID, each supplement provides 350 kcal and 20 grams of protein  - Liberalize diet to Regular, verbal with readback order placed per Dr. Wyline Copas  NUTRITION DIAGNOSIS:   Moderate Malnutrition related to chronic illness (metastatic breast cancer on chemotherapy) as evidenced by mild fat depletion, mild muscle depletion, moderate muscle depletion, percent weight loss (28% weight loss in less than 11 months).  GOAL:   Patient will meet greater than or equal to 90% of their needs  MONITOR:   PO intake, Supplement acceptance, Labs, Skin, Weight trends  REASON FOR ASSESSMENT:   Malnutrition Screening Tool    ASSESSMENT:   63 year old female who presented to the ED on 12/02 with generalized weakness and ulcer to buttocks. PMH of metastatic breast cancer on chemo, HIV, HTN, HLD, CKD anemia. Pt admitted with AKI.   Spoke with pt at bedside. Pt reports feeling okay today and states that she was able to eat fairly well at breakfast. Pt is awaiting her lunch tray at time of RD visit. Pt states that for breakfast, she had 1 scrambled egg and grits. Pt reports she did not like the chicken sausage patty.  RD reached out to MD regarding liberalizing pt's diet to Regular. MD agreed and order placed.  Pt reports that she typically eats 3 times a day at home. Pt reports that she does not eat pork or beef.  Breakfast: Equate shake or yogurt Lunch: chicken noodle soup or chicken and rice soup, occasionally with crackers Dinner: chicken breast, starch, greens  Pt denies snacking in between meals.  Pt states that she has noticed issues with swallowing that began when she started radiation. Pt reports she has the most issues with breads and meats.  Pt noticed a decrease in her appetite about 1 month ago but notes she had been losing weight prior to  that. Pt reports that in October 2019, she weighed 279 lbs.  Reviewed weight history in chart. Pt with progressive weight loss since January 2020. Pt has lost 34.1 kg (75 lbs) since 07/18/18. This is a 28% weight loss in less than 11 months which is severe and significant for timeframe.  Pt is willing to consume Ensure Enlive supplements during admission. RD to order.  Medications reviewed and include: IV abx  Labs reviewed: BUN 72, creatinine 1.39, hemoglobin 8.8  NUTRITION - FOCUSED PHYSICAL EXAM:    Most Recent Value  Orbital Region  Mild depletion  Upper Arm Region  No depletion  Thoracic and Lumbar Region  Mild depletion  Buccal Region  No depletion  Temple Region  Moderate depletion  Clavicle Bone Region  Severe depletion [R side severe, L side mild]  Clavicle and Acromion Bone Region  Severe depletion [R side severe, L side mild]  Scapular Bone Region  Unable to assess  Dorsal Hand  Moderate depletion  Patellar Region  Mild depletion  Anterior Thigh Region  Moderate depletion  Posterior Calf Region  Moderate depletion  Edema (RD Assessment)  Mild [BLE]  Hair  Reviewed  Eyes  Reviewed  Mouth  Reviewed  Skin  Reviewed  Nails  Reviewed       Diet Order:   Diet Order            Diet regular Room service appropriate? Yes; Fluid consistency: Thin  Diet effective now  EDUCATION NEEDS:   Education needs have been addressed  Skin:  Skin Assessment: Reviewed RN Assessment (MASD to buttocks, groin)  Last BM:  no documented BM  Height:   Ht Readings from Last 1 Encounters:  05/28/19 5\' 7"  (1.702 m)    Weight:   Wt Readings from Last 1 Encounters:  05/28/19 88 kg    Ideal Body Weight:  61.4 kg  BMI:  Body mass index is 30.39 kg/m.  Estimated Nutritional Needs:   Kcal:  2000-2200  Protein:  100-115 grams  Fluid:  >/= 2.0 L    Gaynell Face, MS, RD, LDN Inpatient Clinical Dietitian Pager: [phone] Weekend/After Hours:  [phone]

## 2019-05-29 NOTE — Progress Notes (Signed)
On chart reviwe MAP appears > 65 and patient on floor. CCM will sign off. Call if needed    SIGNATURE    Dr. Brand Males, M.D., F.C.C.P,  Pulmonary and Critical Care Medicine Staff Physician, [place] Director - Interstitial Lung Disease  Program  P[facility] [place] [facility] [place], [place], [address]  Pager: [phone], If no answer or between  15:00h - 7:00h: call [phone] Telephone: [phone]  8:20 AM 05/29/2019

## 2019-05-29 NOTE — Progress Notes (Signed)
HEMATOLOGY-ONCOLOGY PROGRESS NOTE  SUBJECTIVE: Kayla Price presented to the emergency room with generalized weakness and an ulcer on her buttock.  She was noted to be hypotensive and had an elevated BUN of 147 and creatinine of 3.1.  She was noted to have a UTI.  She was given IV hydration and 50 mg IV hydrocortisone.  PCCM consulted as well.  Her hypotension is now resolved.  Renal function also improving.  Kayla Price reports that she is feeling better.  Reports some lower extremity edema.  She has no headaches or vision changes.  She is not complaining of any chest pain or shortness of breath.  She reports that she was did have a PET scan yesterday but missed the appointment because of her hospitalization.  Oncology History  Malignant neoplasm of upper-outer quadrant of right breast in female, estrogen receptor negative ([place])  03/2004 Surgery   Right lumpectomy and SLNB for IDC, 0.5cm, 1/2 + SLN, grade 3, ER-, PR-, HER-2 -.  Treated with Doxorubicin and Cyclophosphamide x 4, followed by weekly Paclitaxel x 7 and adjuvant radiation.    10/01/2016 Initial Biopsy   Right breast upper outer quadrant biopsy: IDC, DCIS, triple negative, grade 3. Lymph node: Positive   11/05/2016 Surgery   Right modified radical mastectomy: IDC, grade 3, 2.6 cm, +LVI, triple negative, margins negative, 2/10 LN + metastases.  T2, N1a   11/20/2016 - 03/11/2017 Adjuvant Chemotherapy   Gemcitabine/Carboplatin given on days 1 and 8 on a 21 day cycle x 6 cycles   07/15/2018 - 07/15/2018 Chemotherapy   The patient had pembrolizumab (KEYTRUDA) 200 mg in sodium chloride 0.9 % 50 mL chemo infusion, 200 mg, Intravenous, Once, 0 of 6 cycles  for chemotherapy treatment.    07/15/2018 - 01/01/2019 Chemotherapy   The patient had atezolizumab (TECENTRIQ) 840 mg in sodium chloride 0.9 % 250 mL chemo infusion, 840 mg, Intravenous, Once, 6 of 8 cycles Dose modification: 1,680 mg (original dose 840 mg, Cycle 4, Reason: Provider Judgment), 840 mg  (original dose 840 mg, Cycle 4, Reason: Provider Judgment) Administration: 840 mg (07/15/2018), 840 mg (08/01/2018), 840 mg (08/15/2018), 840 mg (08/29/2018), 840 mg (09/12/2018), 840 mg (09/26/2018), 840 mg (10/10/2018), 840 mg (10/24/2018), 1,680 mg (11/07/2018), 1,680 mg (12/05/2018)  for chemotherapy treatment.    12/19/2018 -  Chemotherapy   The patient had pegfilgrastim (NEULASTA) injection 6 mg, 6 mg, Subcutaneous, Once, 1 of 1 cycle Administration: 6 mg (03/28/2019) pegfilgrastim (NEULASTA ONPRO KIT) injection 6 mg, 6 mg, Subcutaneous, Once, 4 of 8 cycles Administration: 6 mg (01/09/2019), 6 mg (01/23/2019), 6 mg (02/06/2019), 6 mg (02/20/2019), 6 mg (04/03/2019), 6 mg (04/17/2019) eriBULin mesylate (HALAVEN) 2.75 mg in sodium chloride 0.9 % 100 mL chemo infusion, 1.2 mg/m2 = 2.75 mg (100 % of original dose 1.2 mg/m2), Intravenous,  Once, 5 of 9 cycles Dose modification: 1.2 mg/m2 (original dose 1.2 mg/m2, Cycle 1, Reason: Provider Judgment) Administration: 2.75 mg (12/19/2018), 2.75 mg (01/09/2019), 2.75 mg (01/23/2019), 2.75 mg (02/06/2019), 2.75 mg (02/20/2019), 2.75 mg (03/06/2019), 2.75 mg (03/25/2019), 2.75 mg (04/03/2019), 2.75 mg (04/17/2019)  for chemotherapy treatment.    01/02/2019 - 01/02/2019 Chemotherapy   The patient had atezolizumab (TECENTRIQ) 1,200 mg in sodium chloride 0.9 % 250 mL chemo infusion, 1,200 mg, Intravenous, Once, 0 of 6 cycles  for chemotherapy treatment.    Metastatic breast cancer ([place])  11/05/2016 Initial Diagnosis   Recurrent cancer of right breast ([place])   07/15/2018 - 01/01/2019 Chemotherapy   The patient had atezolizumab (TECENTRIQ) 840 mg in  sodium chloride 0.9 % 250 mL chemo infusion, 840 mg, Intravenous, Once, 5 of 8 cycles Dose modification: 1,680 mg (original dose 840 mg, Cycle 4, Reason: Provider Judgment), 840 mg (original dose 840 mg, Cycle 4, Reason: Provider Judgment) Administration: 840 mg (07/15/2018), 840 mg (08/01/2018), 840 mg (08/15/2018), 840 mg (08/29/2018), 840 mg  (09/12/2018), 840 mg (09/26/2018), 840 mg (10/10/2018), 840 mg (10/24/2018), 1,680 mg (11/07/2018)  for chemotherapy treatment.    12/19/2018 -  Chemotherapy   The patient had pegfilgrastim (NEULASTA) injection 6 mg, 6 mg, Subcutaneous, Once, 1 of 1 cycle Administration: 6 mg (03/28/2019) pegfilgrastim (NEULASTA ONPRO KIT) injection 6 mg, 6 mg, Subcutaneous, Once, 4 of 8 cycles Administration: 6 mg (01/09/2019), 6 mg (01/23/2019), 6 mg (02/06/2019), 6 mg (02/20/2019), 6 mg (04/03/2019), 6 mg (04/17/2019) eriBULin mesylate (HALAVEN) 2.75 mg in sodium chloride 0.9 % 100 mL chemo infusion, 1.2 mg/m2 = 2.75 mg (100 % of original dose 1.2 mg/m2), Intravenous,  Once, 5 of 9 cycles Dose modification: 1.2 mg/m2 (original dose 1.2 mg/m2, Cycle 1, Reason: Provider Judgment) Administration: 2.75 mg (12/19/2018), 2.75 mg (01/09/2019), 2.75 mg (01/23/2019), 2.75 mg (02/06/2019), 2.75 mg (02/20/2019), 2.75 mg (03/06/2019), 2.75 mg (03/25/2019), 2.75 mg (04/03/2019), 2.75 mg (04/17/2019)  for chemotherapy treatment.       REVIEW OF SYSTEMS:   Constitutional: Denies fevers, chills  Eyes: Denies blurriness of vision Ears, nose, mouth, throat, and face: Denies mucositis or sore throat Respiratory: Denies cough, dyspnea or wheezes Cardiovascular: Denies palpitation, chest discomfort Gastrointestinal:  Denies nausea, heartburn or change in bowel habits Skin: Denies abnormal skin rashes Lymphatics: Denies new lymphadenopathy or easy bruising Neurological:Denies numbness, tingling or new weaknesses Behavioral/Psych: Mood is stable, no new changes  Extremities: Reports some swelling in her lower extremities All other systems were reviewed with the patient and are negative.  I have reviewed the past medical history, past surgical history, social history and family history with the patient and they are unchanged from previous note.   PHYSICAL EXAMINATION: ECOG PERFORMANCE STATUS: 2 - Symptomatic, <50% confined to bed  Vitals:    05/29/19 1206 05/29/19 1226  BP: 102/60   Pulse:    Resp:  20  Temp:  97.8 F (36.6 C)  SpO2:     Filed Weights   05/28/19 2028  Weight: 194 lb 0.1 oz (88 kg)    Intake/Output from previous day: 12/03 0701 - 12/04 0700 In: 3126.6 [I.V.:1000; IV Piggyback:2126.6] Out: 550 [Urine:550]  GENERAL:alert, no distress and comfortable LUNGS: clear to auscultation and percussion with normal breathing effort HEART: regular rate & rhythm and no murmurs and trace lower extremity edema bilaterally ABDOMEN:abdomen soft, non-tender and normal bowel sounds Musculoskeletal:no cyanosis of digits and no clubbing  NEURO: alert & oriented x 3 with fluent speech, no focal motor/sensory deficits  LABORATORY DATA:  I have reviewed the data as listed CMP Latest Ref Rng & Units 05/29/2019 05/28/2019 05/27/2019  Glucose 70 - 99 mg/dL 104(H) 107(H) 112(H)  BUN 8 - 23 mg/dL 72(H) 96(H) 147(H)  Creatinine 0.44 - 1.00 mg/dL 1.39(H) 1.88(H) 3.14(H)  Sodium 135 - 145 mmol/L 144 143 138  Potassium 3.5 - 5.1 mmol/L 3.9 4.2 5.4(H)  Chloride 98 - 111 mmol/L 119(H) 118(H) 112(H)  CO2 22 - 32 mmol/L 18(L) 19(L) 19(L)  Calcium 8.9 - 10.3 mg/dL 8.5(L) 8.5(L) 9.3  Total Protein 6.5 - 8.1 g/dL - - 6.2(L)  Total Bilirubin 0.3 - 1.2 mg/dL - - 1.0  Alkaline Phos 38 - 126 U/L - - 48  AST 15 - 41 U/L - - 26  ALT 0 - 44 U/L - - 46(H)    Lab Results  Component Value Date   WBC 2.7 (L) 05/29/2019   HGB 8.8 (L) 05/29/2019   HCT 27.2 (L) 05/29/2019   MCV 96.8 05/29/2019   PLT 40 (L) 05/29/2019   NEUTROABS 5.9 05/27/2019    US Renal  Result Date: 05/28/2019 CLINICAL DATA:  Acute kidney injury. EXAM: RENAL / URINARY TRACT ULTRASOUND COMPLETE COMPARISON:  PET CT scan 03/11/2019. FINDINGS: Right Kidney: Renal measurements: 9.9 x 4.3 x 5.0 cm = volume: 109.7 mL . Echogenicity within normal limits. No mass or hydronephrosis visualized. Left Kidney: Renal measurements: 9.6 x 5.4 x 4.9 cm = volume: Is 134.3. mL.  Echogenicity within normal limits. No mass or hydronephrosis visualized. Bladder: Appears normal for degree of bladder distention. Other: Small right pleural effusion and gallstones are noted as seen on the prior exam. IMPRESSION: Negative for hydronephrosis.  Normal appearing kidneys. Right pleural effusion. Gallstones. Electronically Signed   By: Inge Rise M.D.   On: 05/28/2019 07:15    ASSESSMENT:63 y.o.Kayla Price   (1) status post right lumpectomyand sentinel lymph node sampling October 2005 for a 0.6 cm invasive ductal carcinoma involving one out of 2 sentinel lymph nodes sampled, grade 3, triple-negative, treated adjuvantly with doxorubicin and cyclophosphamide4 followed by weekly paclitaxel7, followed by adjuvant radiation  RECURRENT DISEASE: (2) status post right breast upper outer quadrant biopsy and right axillary lymph node biopsy 10/01/2016, both positive for a T2 N1, stage IIIB invasive ductal carcinoma,triple negative, with an MIB-1 of 50-70%  (3) status post right modified radical mastectomy05/14/2018 showing a pT2 pN1, stage IIIB invasive ductal carcinoma, grade 3, triple negative, with negative margins  (4) not a candidate for radiation given prior history  (5) adjuvant chemotherapy consisting of carboplatin and gemcitabine given days 1 and 8 of each 21 day cycle, for 6 cycles, starting 11/20/2016, completed 03/11/2017 (a) day 8 cycle 2 omitted because of neutropenia; Neupogen/Neulasta added  (5) HIV positivity: under care of [facility])  (6) Genetic testing 06/17/2017: no pathogenicmutations.Genes tested: APC, ATM, AXIN2, BARD1, BLM, BMPR1A, BRCA1, BRCA2, BRIP1, CDH1, CDK4, CDKN2A (p14ARF), CDKN2A (p16INK4a), CEBPA, CHEK2, CTNNA1, DICER1, EPCAM*, GATA2, GREM1*, HRAS, KIT, MEN1, MLH1, MSH2, MSH3, MSH6, MUTYH, NBN, NF1, PALB2, PDGFRA, PMS2, POLD1, POLE, PTEN, RAD50, RAD51C, RAD51D, RUNX1, SDHB, SDHC, SDHD, SMAD4, SMARCA4, STK11, TERC,  TERT, TP53, TSC1, TSC2, VHL.The following genes were evaluated for sequence changes only: HOXB13*, NTHL1*, SDHA.  (a) A variant of uncertain significance (VUS)in a gene calledNTHL1was also noted.c.736G>A (p.Ala246Thr)  METASTATIC DISEASE: November 2019 (1) Patient seen in urgent care and ultimately ED on 04/14/2018 for shortness of breath, chest xray demonstrated Right pleural effusion.  (a) right thoracenteses on 10/21 and 11/4 results show atypical cells, non diagnostic (b) CT chest 04/22/2018 shows re-accumulation of fluid, and right pleural nodularity. (c) PET scan on 05/01/2018 shows hypermetabolic pleural based metastases, right CP angle nodal metastases, no evidence of malignancy in abdomen and pelvis. (d) bronchoscopy with biopsy by Dr. Lamonte Sakai and BAL on 05/15/2018 was non diagnostic (e) VAT biopsy of the right pleura 12/06/2019confirms carcinoma, triple negative (f) [facility] shows PD-L1 positive(1% in [facility]); otherwise microsatellite stable, TMB low (5/Mb), no PIK3 mutations; other mutations suggest sensitivity to MTOR inhibitors and several TKIs  (2) Atezolizumab started 07/15/2018 given every other week (a) PET 08/13/2018 shows tumor Right hemithorax (new baseline study) (b) Atezo changed to Q4w starting with 11/07/2018 dose (c)  PET 11/28/2018 documents progression in the right lung and pleural area as well as lymph nodes, and a T1 skeletal metastasis (d) atezolizumab discontinued after 12/05/2018 dose  (3) eribulin day 1 and 8 of every 21 day cycle started 12/19/2018 (a) changed to every other week with Neulsta support due to neutropenia causing treatment delays and her h/o HIV (delays due to insurance denial of onpro after 02/20/2019 dose) (b) PET scan on 03/11/2019 showed stable to improved measurable disease, with multiple new bone lesions             (c)  zoledronate given every 12 weeks starting on 03/25/2019  (4) brain MRI 04/28/2019 documents multiple intracranial metastases (a)whole brain radiation 04/30/2019 - 05/13/2019, 30 Gy in 10 fractions  (5) hospitalization 05/27/2019-hypotension, UTI, AKI   PLAN:  Kayla Price seems to be improving.  She has no longer hypotensive and her renal function is improving.  Discussed with the hospitalist who anticipates she may be able to discharge over the weekend.  The patient missed her PET scan due to her hospitalization.  We will reschedule this for the patient when she is discharged from the hospital.  If we document disease progression, we may consider switching her chemotherapy to CMF.  She was advised to keep her follow-up appointments as scheduled.   LOS: 1 day   Mikey Bussing, DNP, AGPCNP-BC, AOCNP 05/29/19

## 2019-05-29 NOTE — Consult Note (Signed)
[place] Nurse Consult Note: Reason for Consult: Patient seen in follow up following assessment by my partner S. Doty on 12/3.  Patient with UI and MASD/ITD.  POC implemented by MD was for nystatin powder TID. Currently there is a PurWick urinary collection device in place to diver urine from the lesions. Patient tells me that she initially refused treatment with the powder, but that she allowed the RN to apply for the first time last night.  She endorses some relief from itching and pain. Today I reinforce that TID application of the medication for resolution of fungal overgrowth and instruct her to allow Nursing to apply as directed. Additionally, I will ask MD to consider adding a few doses of oral diflucan unless contraindicated to expedite resolution of the moisture related skin damage. It should be noted that urine management will continue to be an issue at discharge without the suction device (PurWick).  [facility] nursing team will not follow, but will remain available to this patient, the nursing and medical teams.  Please re-consult if needed. Thanks, Maudie Flakes, MSN, RN, [place], Arther Abbott  Pager# [phone]

## 2019-05-29 NOTE — Plan of Care (Signed)
  Problem: Activity: Goal: Risk for activity intolerance will decrease Outcome: Progressing   Problem: Coping: Goal: Level of anxiety will decrease Outcome: Progressing   Problem: Pain Managment: Goal: General experience of comfort will improve Outcome: Progressing   Elesa Hacker, RN

## 2019-05-29 NOTE — Progress Notes (Signed)
PROGRESS NOTE    Kayla Price  ZSW:[id number] DOB: March 22, 1956 DOA: 05/27/2019 PCP: Lauree Chandler, NP    Brief Narrative:  63 y.o. female with medical history significant of metastatic breast cancer currently on chemo, HIV, hypertension, hyperlipidemia, CKD, anemia of chronic disease presenting to the ED for evaluation of generalized weakness and buttock ulcer.  Patient states since she left the hospital last month she has been wearing pull-ups.  Her family has noted that she has ulcers in her genital and buttock region.  These ulcers are painful whenever she tries to move.  It burns when she urinates.  She has not had any fevers.  She has not been sexually active since 2005.  No cough or shortness of breath.  She has not been eating much.  No nausea, vomiting, abdominal pain, or diarrhea.  States she was previously taking both olmesartan and hydrochlorothiazide.  She had an appointment with her PCP a few days ago and hydrochlorothiazide was stopped.  Since then, she is only taking olmesartan.   ED Course: Hypotensive with recorded blood pressure as low as 76/55.  Afebrile and not tachycardic.  No leukocytosis.  Lactic acid 2.0>2.0.  Hemoglobin 11.2, stable.  Platelet count 54,000, was 86,000 on 11/24.  Potassium 5.4.  Bicarb 19, anion gap 7.  Blood glucose 112.  BUN 147, creatinine 3.1.  UA with moderate amount of leukocytes, 11-20 WBCs, and rare bacteria.  Blood culture x2 pending. 1 L normal saline bolus ordered.  Hycet given for pain.  Assessment & Plan:   Principal Problem:   AKI (acute kidney injury) ([place]) Active Problems:   BREAST CANCER, HX OF   Hyperkalemia   UTI (urinary tract infection)   Hypotension   ARF (acute renal failure) ([facility]CC)   AKI on CKD stage III -Suspect prerenal due to decreased p.o. intake/dehydration and diuretic/ARB use.   BUN 147, creatinine 3.1 at presentation.  Baseline creatinine 1.3-1.4. -Held diuretic/ARB.  Avoid nephrotoxic agents/contrast. -Renal  US reviewed and is unremarkable -Renal function now much improved and at baseline, would hold further IVF  Mild hyperkalemia -Likely related to AKI and ARB use.  Potassium 5.4. -Held ARB -resolved -Repeat bmet in am  Mild normal anion gap metabolic acidosis Bicarb 19, anion gap 7.  Likely related to AKI and diuretic use. -Holding diuretic for now  UTI UA with moderate amount of leukocytes, 11-20 WBCs, and rare bacteria. -Continued on empiric Ceftriaxone -Urine culture with 30,000 gm neg rods, pending speciation -clinically improving  Hypotension -Hypotensive with recorded blood pressure as low as 76/55.   -Pt remains afebrile and not tachycardic.  No leukocytosis.   -Resolved with multiple IVF boluses and 50mg  IV hydrocortisone -appreciate input by PCCM  Superficial skin breakdown/ulcers in the genital/groin/buttocks region, ?intertrigo -No signs of cellulitis noted at time of presentation -Receiving ceftriaxone for UTI as mentioned above -Wound care consulted -Nystatin powder was ordered on presentation -Norco as needed for pain -Tylenol as needed -Discussed with WOC. Recommendation for systemic antifungal. Will order  HIV -Followed by infectious disease.  Last CD4 count 475 on 10/26. -Stable at this time  Hypertension -Holding irbesartan and hydrochlorothiazide given AKI  -Hypotensive overnight, now improving  Hypothyroidism -TSH normal on 04/03/2019. -Continue Synthroid as pt tolerates  Anemia of chronic disease -Hemoglobin 11.2 on presentation. -No signs of active bleeding.  Metastatic breast cancer currently on chemo -Oncology following  Worsening thrombocytopenia Possibly related to chemo. Platelet count 54,000, was 86,000 on 11/24. No signs of active bleeding. -Continue  to monitor CBC -Avoid anticoagulation/antiplatelet agents  DVT prophylaxis: SCD's Code Status: DNR Family Communication: Pt in room, family not at bedside Disposition  Plan: Uncertain at this time  Consultants:   PCCM  Procedures:     Antimicrobials: Anti-infectives (From admission, onward)   Start     Dose/Rate Route Frequency Ordered Stop   05/28/19 0630  cefTRIAXone (ROCEPHIN) 1 g in sodium chloride 0.9 % 100 mL IVPB     1 g 200 mL/hr over 30 Minutes Intravenous Daily 05/28/19 0556        Subjective: Reports feeling better today  Objective: Vitals:   05/29/19 0300 05/29/19 0525 05/29/19 1206 05/29/19 1226  BP: (!) 87/59 (!) 85/62 102/60   Pulse: 85     Resp: 20 15  20   Temp: 98.3 F (36.8 C)   97.8 F (36.6 C)  TempSrc: Oral   Oral  SpO2: 91%     Weight:      Height:        Intake/Output Summary (Last 24 hours) at 05/29/2019 1546 Last data filed at 05/29/2019 0400 Gross per 24 hour  Intake 1026.58 ml  Output 550 ml  Net 476.58 ml   Filed Weights   05/28/19 2028  Weight: 88 kg    Examination: General exam: Appears calm and comfortable  Respiratory system: Clear to auscultation. Respiratory effort normal. Cardiovascular system: S1 & S2 heard, Regular Gastrointestinal system: Abdomen is nondistended, soft and nontender. No organomegaly or masses felt. Normal bowel sounds heard. Central nervous system: Alert and oriented. No focal neurological deficits. Extremities: Symmetric 5 x 5 power. Skin: No rashes, perfused Psychiatry: Judgement and insight appear normal. Mood & affect appropriate.   Data Reviewed: I have personally reviewed following labs and imaging studies  CBC: Recent Labs  Lab 05/27/19 2327 05/29/19 0551  WBC 6.4 2.7*  NEUTROABS 5.9  --   HGB 11.2* 8.8*  HCT 34.4* 27.2*  MCV 95.6 96.8  PLT 54* 40*   Basic Metabolic Panel: Recent Labs  Lab 05/27/19 2327 05/28/19 1822 05/29/19 0551  NA 138 143 144  K 5.4* 4.2 3.9  CL 112* 118* 119*  CO2 19* 19* 18*  GLUCOSE 112* 107* 104*  BUN 147* 96* 72*  CREATININE 3.14* 1.88* 1.39*  CALCIUM 9.3 8.5* 8.5*   GFR: Estimated Creatinine Clearance: 47.2  mL/min (A) (by C-G formula based on SCr of 1.39 mg/dL (H)). Liver Function Tests: Recent Labs  Lab 05/27/19 2327  AST 26  ALT 46*  ALKPHOS 48  BILITOT 1.0  PROT 6.2*  ALBUMIN 3.3*   No results for input(s): LIPASE, AMYLASE in the last 168 hours. No results for input(s): AMMONIA in the last 168 hours. Coagulation Profile: No results for input(s): INR, PROTIME in the last 168 hours. Cardiac Enzymes: No results for input(s): CKTOTAL, CKMB, CKMBINDEX, TROPONINI in the last 168 hours. BNP (last 3 results) No results for input(s): PROBNP in the last 8760 hours. HbA1C: No results for input(s): HGBA1C in the last 72 hours. CBG: No results for input(s): GLUCAP in the last 168 hours. Lipid Profile: No results for input(s): CHOL, HDL, LDLCALC, TRIG, CHOLHDL, LDLDIRECT in the last 72 hours. Thyroid Function Tests: No results for input(s): TSH, T4TOTAL, FREET4, T3FREE, THYROIDAB in the last 72 hours. Anemia Panel: No results for input(s): VITAMINB12, FOLATE, FERRITIN, TIBC, IRON, RETICCTPCT in the last 72 hours. Sepsis Labs: Recent Labs  Lab 05/27/19 2327 05/28/19 0224 05/28/19 1822 05/28/19 2108  LATICACIDVEN 2.0* 2.0* 2.0* 1.5  Recent Results (from the past 240 hour(s))  SARS CORONAVIRUS 2 (TAT 6-24 HRS) Nasopharyngeal Nasopharyngeal Swab     Status: None   Collection Time: 05/28/19  5:07 AM   Specimen: Nasopharyngeal Swab  Result Value Ref Range Status   SARS Coronavirus 2 NEGATIVE NEGATIVE Final    Comment: (NOTE) SARS-CoV-2 target nucleic acids are NOT DETECTED. The SARS-CoV-2 RNA is generally detectable in upper and lower respiratory specimens during the acute phase of infection. Negative results do not preclude SARS-CoV-2 infection, do not rule out co-infections with other pathogens, and should not be used as the sole basis for treatment or other patient management decisions. Negative results must be combined with clinical observations, patient history, and  epidemiological information. The expected result is Negative. Fact Sheet for Patients: SugarRoll.be Fact Sheet for Healthcare Providers: https://www.woods-mathews.com/ This test is not yet approved or cleared by the [place] FDA and  has been authorized for detection and/or diagnosis of SARS-CoV-2 by FDA under an Emergency Use Authorization (EUA). This EUA will remain  in effect (meaning this test can be used) for the duration of the COVID-19 declaration under Section 56 4(b)(1) of the Act, 21 U.S.C. section 360bbb-3(b)(1), unless the authorization is terminated or revoked sooner. Performed at [facility], [place] [address] [place], [place] [address]   Culture, blood (routine x 2)     Status: None (Preliminary result)   Collection Time: 05/28/19  6:24 AM   Specimen: BLOOD LEFT WRIST  Result Value Ref Range Status   Specimen Description BLOOD LEFT WRIST  Final   Special Requests   Final    BOTTLES DRAWN AEROBIC AND ANAEROBIC Blood Culture results may not be optimal due to an inadequate volume of blood received in culture bottles   Culture   Final    NO GROWTH 1 DAY Performed at [facility], [place] [address], [place], [place] [address]    Report Status PENDING  Incomplete  Culture, blood (routine x 2)     Status: None (Preliminary result)   Collection Time: 05/28/19  6:40 AM   Specimen: BLOOD  Result Value Ref Range Status   Specimen Description BLOOD LEFT ANTECUBITAL  Final   Special Requests   Final    BOTTLES DRAWN AEROBIC AND ANAEROBIC Blood Culture adequate volume   Culture   Final    NO GROWTH 1 DAY Performed at [facility], [place] [address] [place], [place] [address]    Report Status PENDING  Incomplete  Urine Culture     Status: Abnormal (Preliminary result)   Collection Time: 05/28/19 11:17 AM   Specimen: Urine, Random  Result Value Ref Range Status   Specimen Description URINE, RANDOM  Final   Special  Requests NONE  Final   Culture (A)  Final    30,000 COLONIES/mL GRAM NEGATIVE RODS IDENTIFICATION AND SUSCEPTIBILITIES TO FOLLOW Performed at [facility], [place] [address] [place],  [address]    Report Status PENDING  Incomplete     Radiology Studies: US Renal  Result Date: 05/28/2019 CLINICAL DATA:  Acute kidney injury. EXAM: RENAL / URINARY TRACT ULTRASOUND COMPLETE COMPARISON:  PET CT scan 03/11/2019. FINDINGS: Right Kidney: Renal measurements: 9.9 x 4.3 x 5.0 cm = volume: 109.7 mL . Echogenicity within normal limits. No mass or hydronephrosis visualized. Left Kidney: Renal measurements: 9.6 x 5.4 x 4.9 cm = volume: Is 134.3. mL. Echogenicity within normal limits. No mass or hydronephrosis visualized. Bladder: Appears normal for degree  of bladder distention. Other: Small right pleural effusion and gallstones are noted as seen on the prior exam. IMPRESSION: Negative for hydronephrosis.  Normal appearing kidneys. Right pleural effusion. Gallstones. Electronically Signed   By: Inge Rise M.D.   On: 05/28/2019 07:15    Scheduled Meds: . Chlorhexidine Gluconate Cloth  6 each Topical Daily  . feeding supplement (ENSURE ENLIVE)  237 mL Oral TID BM  . lidocaine   Topical Once  . nystatin   Topical TID  . sodium chloride flush  10-40 mL Intracatheter Q12H   Continuous Infusions: . cefTRIAXone (ROCEPHIN)  IV 1 g (05/29/19 0936)  . sodium chloride Stopped (05/28/19 1939)     LOS: 1 day   Marylu Lund, MD [facility] Pager On Amion  If 7PM-7AM, please contact night-coverage 05/29/2019, 3:46 PM

## 2019-05-29 NOTE — Progress Notes (Signed)
Physical Therapy Treatment Patient Details Name: Kayla Price MRN: [id number] DOB: 1955-10-08 Today's Date: 05/29/2019    History of Present Illness Kayla Price is a 63 y.o. female with medical history significant of metastatic breast cancer currently on chemo, HIV, hypertension, hyperlipidemia, CKD, anemia of chronic disease presenting to the ED for evaluation of generalized weakness and buttock ulcer.    PT Comments    Patient progressing slowly towards PT goals. Continues to require Mod A for bed mobility and standing from EOB. Tolerated gait training with Min A and RW for support. Fatigues quickly and reports weakness resulting in need to sit. HR up to 120 bpm. Increased time and effort for all movement. Encouraged walking with nursing daily. Will continue to follow.    Follow Up Recommendations  Home health PT;Supervision/Assistance - 24 hour(HH aide)     Equipment Recommendations  Rolling walker with 5" wheels    Recommendations for Other Services       Precautions / Restrictions Precautions Precautions: Fall Precaution Comments: watch HR Restrictions Weight Bearing Restrictions: No    Mobility  Bed Mobility Overal bed mobility: Needs Assistance Bed Mobility: Supine to Sit     Supine to sit: Mod assist;HOB elevated     General bed mobility comments: Assist with LLE and to elevate trunk; increased time and effort.  Transfers Overall transfer level: Needs assistance Equipment used: Rolling walker (2 wheeled) Transfers: Sit to/from Stand Sit to Stand: Mod assist;From elevated surface         General transfer comment: Assist to power to standing with cues for hand placement as pt pulling up on RW. Stabilizing feet prior to standing.  Ambulation/Gait Ambulation/Gait assistance: Min assist Gait Distance (Feet): 30 Feet Assistive device: Rolling walker (2 wheeled) Gait Pattern/deviations: Shuffle;Decreased stride length;Decreased dorsiflexion -  right;Decreased dorsiflexion - left;Step-through pattern;Trunk flexed Gait velocity: decreased   General Gait Details: Slow, shuffling like gait, HR up to 120 bpm. fatigues. No SOB noted.   Stairs             Wheelchair Mobility    Modified Rankin (Stroke Patients Only)       Balance Overall balance assessment: Needs assistance Sitting-balance support: Feet supported;No upper extremity supported Sitting balance-Leahy Scale: Fair     Standing balance support: During functional activity Standing balance-Leahy Scale: Poor Standing balance comment: reliant on UE support                            Cognition Arousal/Alertness: Awake/alert Behavior During Therapy: WFL for tasks assessed/performed Overall Cognitive Status: Within Functional Limits for tasks assessed                                        Exercises      General Comments        Pertinent Vitals/Pain Pain Assessment: Faces Faces Pain Scale: Hurts little more Pain Location: generalized Pain Descriptors / Indicators: Grimacing;Sore Pain Intervention(s): Repositioned;Monitored during session    Home Living                      Prior Function            PT Goals (current goals can now be found in the care plan section) Progress towards PT goals: Progressing toward goals(slowly)    Frequency    Min 3X/week  PT Plan Current plan remains appropriate    Co-evaluation              AM-PAC PT "6 Clicks" Mobility   Outcome Measure  Help needed turning from your back to your side while in a flat bed without using bedrails?: A Lot Help needed moving from lying on your back to sitting on the side of a flat bed without using bedrails?: A Lot Help needed moving to and from a bed to a chair (including a wheelchair)?: A Lot Help needed standing up from a chair using your arms (e.g., wheelchair or bedside chair)?: A Lot Help needed to walk in hospital  room?: A Little Help needed climbing 3-5 steps with a railing? : A Lot 6 Click Score: 13    End of Session Equipment Utilized During Treatment: Gait belt Activity Tolerance: Patient limited by fatigue(and weakness) Patient left: in chair;with call bell/phone within reach;with nursing/sitter in room Nurse Communication: Mobility status PT Visit Diagnosis: Other abnormalities of gait and mobility (R26.89);Muscle weakness (generalized) (M62.81);Adult, failure to thrive (R62.7)     Time: 1138-1202 PT Time Calculation (min) (ACUTE ONLY): 24 min  Charges:  $Gait Training: 8-22 mins $Therapeutic Activity: 8-22 mins                     Marisa Severin, PT, DPT Acute Rehabilitation Services Pager [phone] Office [place] 05/29/2019, 2:54 PM

## 2019-05-29 NOTE — Telephone Encounter (Signed)
Left message re 12/11 visit. Schedule mailed. Patient also mychart active.

## 2019-05-30 DIAGNOSIS — D696 Thrombocytopenia, unspecified: Secondary | ICD-10-CM | POA: Diagnosis not present

## 2019-05-30 LAB — URINE CULTURE: Culture: 30000 — AB

## 2019-05-30 LAB — COMPREHENSIVE METABOLIC PANEL
ALT: 37 U/L (ref 0–44)
AST: 22 U/L (ref 15–41)
Albumin: 2.4 g/dL — ABNORMAL LOW (ref 3.5–5.0)
Alkaline Phosphatase: 52 U/L (ref 38–126)
Anion gap: 7 (ref 5–15)
BUN: 50 mg/dL — ABNORMAL HIGH (ref 8–23)
CO2: 20 mmol/L — ABNORMAL LOW (ref 22–32)
Calcium: 8.8 mg/dL — ABNORMAL LOW (ref 8.9–10.3)
Chloride: 120 mmol/L — ABNORMAL HIGH (ref 98–111)
Creatinine, Ser: 1.15 mg/dL — ABNORMAL HIGH (ref 0.44–1.00)
GFR calc Af Amer: 59 mL/min — ABNORMAL LOW (ref >60)
GFR calc non Af Amer: 51 mL/min — ABNORMAL LOW (ref >60)
Glucose, Bld: 118 mg/dL — ABNORMAL HIGH (ref 70–99)
Potassium: 4.1 mmol/L (ref 3.5–5.1)
Sodium: 147 mmol/L — ABNORMAL HIGH (ref 135–145)
Total Bilirubin: 0.6 mg/dL (ref 0.3–1.2)
Total Protein: 5.2 g/dL — ABNORMAL LOW (ref 6.5–8.1)

## 2019-05-30 LAB — CBC
HCT: 27.5 % — ABNORMAL LOW (ref 36.0–46.0)
Hemoglobin: 8.9 g/dL — ABNORMAL LOW (ref 12.0–15.0)
MCH: 31.2 pg (ref 26.0–34.0)
MCHC: 32.4 g/dL (ref 30.0–36.0)
MCV: 96.5 fL (ref 80.0–100.0)
Platelets: 49 10*3/uL — ABNORMAL LOW (ref 150–400)
RBC: 2.85 MIL/uL — ABNORMAL LOW (ref 3.87–5.11)
RDW: 18.2 % — ABNORMAL HIGH (ref 11.5–15.5)
WBC: 1.8 10*3/uL — ABNORMAL LOW (ref 4.0–10.5)
nRBC: 0 % (ref 0.0–0.2)

## 2019-05-30 LAB — T4, FREE: Free T4: 0.65 ng/dL (ref 0.61–1.12)

## 2019-05-30 MED ORDER — DEXAMETHASONE 4 MG PO TABS
2.0000 mg | ORAL_TABLET | Freq: Every day | ORAL | Status: DC
  Administered 2019-05-30 – 2019-05-31 (×2): 2 mg via ORAL
  Filled 2019-05-30 (×2): qty 1

## 2019-05-30 NOTE — Progress Notes (Signed)
PROGRESS NOTE    Kayla Price  XIP:[id number] DOB: 1956/02/03 DOA: 05/27/2019 PCP: Lauree Chandler, NP    Brief Narrative:  63 y.o. female with medical history significant of metastatic breast cancer currently on chemo, HIV, hypertension, hyperlipidemia, CKD, anemia of chronic disease presenting to the ED for evaluation of generalized weakness and buttock ulcer.  Patient states since she left the hospital last month she has been wearing pull-ups.  Her family has noted that she has ulcers in her genital and buttock region.  These ulcers are painful whenever she tries to move.  It burns when she urinates.  She has not had any fevers.  She has not been sexually active since 2005.  No cough or shortness of breath.  She has not been eating much.  No nausea, vomiting, abdominal pain, or diarrhea.  States she was previously taking both olmesartan and hydrochlorothiazide.  She had an appointment with her PCP a few days ago and hydrochlorothiazide was stopped.  Since then, she is only taking olmesartan.   ED Course: Hypotensive with recorded blood pressure as low as 76/55.  Afebrile and not tachycardic.  No leukocytosis.  Lactic acid 2.0>2.0.  Hemoglobin 11.2, stable.  Platelet count 54,000, was 86,000 on 11/24.  Potassium 5.4.  Bicarb 19, anion gap 7.  Blood glucose 112.  BUN 147, creatinine 3.1.  UA with moderate amount of leukocytes, 11-20 WBCs, and rare bacteria.  Blood culture x2 pending. 1 L normal saline bolus ordered.  Hycet given for pain.  Assessment & Plan:   Principal Problem:   AKI (acute kidney injury) ([place]) Active Problems:   BREAST CANCER, HX OF   Hyperkalemia   UTI (urinary tract infection)   Hypotension   ARF (acute renal failure) (HCC)   AKI on CKD stage III -Suspect prerenal due to decreased p.o. intake/dehydration and diuretic/ARB use.   BUN 147, creatinine 3.1 at presentation.  Baseline creatinine 1.3-1.4. -Held diuretic/ARB.  Avoid nephrotoxic agents/contrast. -Renal  US reviewed and is unremarkable -Renal function now resolved  Mild hyperkalemia -Likely related to AKI and ARB use.  Potassium 5.4. -Held ARB -resolved -Recheck bmet in AM  Mild normal anion gap metabolic acidosis Bicarb 19, anion gap 7.  Likely related to AKI and diuretic use. -Holding diuretic for now -Improved  UTI UA with moderate amount of leukocytes, 11-20 WBCs, and rare bacteria. -Continued on empiric Ceftriaxone -Urine culture with 30,000 gm neg rods, pending speciation -clinically improving  Hypotension -Hypotensive with recorded blood pressure as low as 76/55.   -Pt remains afebrile and not tachycardic.  No leukocytosis.   -Resolved with multiple IVF boluses and 50mg  IV hydrocortisone -appreciate input by PCCM -Overnight, pt with sbp in the 70's. Pt had recently finished a decadron taper -Will continue decadron for now and anticipate slower taper  -Given continued significant hypotension, would continue to follow closely  Superficial skin breakdown/ulcers in the genital/groin/buttocks region, ?intertrigo -No signs of cellulitis noted at time of presentation -Receiving ceftriaxone for UTI as mentioned above -Wound care consulted -Nystatin powder was ordered on presentation -Norco as needed for pain -Tylenol as needed -Discussed with WOC. Recommendation for systemic antifungal. Have ordered  HIV -Followed by infectious disease.  Last CD4 count 475 on 10/26. -Remains stable at this time  Hypertension -Holding irbesartan and hydrochlorothiazide given AKI  -Hypotensive overnight, see above. Have resumed decadron taper -cont to follow  Hypothyroidism -TSH normal on 04/03/2019. -Continue Synthroid as pt tolerates  Anemia of chronic disease -Hemoglobin 11.2 on presentation. -  No signs of active bleeding.  Metastatic breast cancer currently on chemo -Oncology following  Worsening thrombocytopenia Possibly related to chemo. Platelet count 54,000,  was 86,000 on 11/24. No signs of active bleeding. -Continue to monitor CBC -Avoid anticoagulation/antiplatelet agents  DVT prophylaxis: SCD's Code Status: DNR Family Communication: Pt in room, family not at bedside Disposition Plan: Uncertain at this time  Consultants:   PCCM  Procedures:     Antimicrobials: Anti-infectives (From admission, onward)   Start     Dose/Rate Route Frequency Ordered Stop   05/29/19 1615  fluconazole (DIFLUCAN) tablet 400 mg     400 mg Oral Daily 05/29/19 1606 06/03/19 0959   05/28/19 0630  cefTRIAXone (ROCEPHIN) 1 g in sodium chloride 0.9 % 100 mL IVPB     1 g 200 mL/hr over 30 Minutes Intravenous Daily 05/28/19 0556        Subjective: States feeling well  Objective: Vitals:   05/30/19 1145 05/30/19 1149 05/30/19 1209 05/30/19 1418  BP: (!) 77/54 93/62 (!) 127/55 104/90  Pulse:    71  Resp: 18 (!) 23 16   Temp:      TempSrc:      SpO2:      Weight:      Height:        Intake/Output Summary (Last 24 hours) at 05/30/2019 1626 Last data filed at 05/30/2019 1200 Gross per 24 hour  Intake 360 ml  Output -  Net 360 ml   Filed Weights   05/28/19 2028 05/30/19 0415  Weight: 88 kg 91.2 kg    Examination: General exam: Conversant, in no acute distress Respiratory system: normal chest rise, clear, no audible wheezing Cardiovascular system: regular rhythm, s1-s2 Gastrointestinal system: Nondistended, nontender, pos BS Central nervous system: No seizures, no tremors Extremities: No cyanosis, no joint deformities Skin: No rashes, no pallor Psychiatry: Affect normal // no auditory hallucinations   Data Reviewed: I have personally reviewed following labs and imaging studies  CBC: Recent Labs  Lab 05/27/19 2327 05/29/19 0551 05/30/19 0515  WBC 6.4 2.7* 1.8*  NEUTROABS 5.9  --   --   HGB 11.2* 8.8* 8.9*  HCT 34.4* 27.2* 27.5*  MCV 95.6 96.8 96.5  PLT 54* 40* 49*   Basic Metabolic Panel: Recent Labs  Lab 05/27/19 2327  05/28/19 1822 05/29/19 0551 05/30/19 0515  NA 138 143 144 147*  K 5.4* 4.2 3.9 4.1  CL 112* 118* 119* 120*  CO2 19* 19* 18* 20*  GLUCOSE 112* 107* 104* 118*  BUN 147* 96* 72* 50*  CREATININE 3.14* 1.88* 1.39* 1.15*  CALCIUM 9.3 8.5* 8.5* 8.8*   GFR: Estimated Creatinine Clearance: 58 mL/min (A) (by C-G formula based on SCr of 1.15 mg/dL (H)). Liver Function Tests: Recent Labs  Lab 05/27/19 2327 05/30/19 0515  AST 26 22  ALT 46* 37  ALKPHOS 48 52  BILITOT 1.0 0.6  PROT 6.2* 5.2*  ALBUMIN 3.3* 2.4*   No results for input(s): LIPASE, AMYLASE in the last 168 hours. No results for input(s): AMMONIA in the last 168 hours. Coagulation Profile: No results for input(s): INR, PROTIME in the last 168 hours. Cardiac Enzymes: No results for input(s): CKTOTAL, CKMB, CKMBINDEX, TROPONINI in the last 168 hours. BNP (last 3 results) No results for input(s): PROBNP in the last 8760 hours. HbA1C: No results for input(s): HGBA1C in the last 72 hours. CBG: No results for input(s): GLUCAP in the last 168 hours. Lipid Profile: No results for input(s): CHOL, HDL, LDLCALC, TRIG,  CHOLHDL, LDLDIRECT in the last 72 hours. Thyroid Function Tests: Recent Labs    05/30/19 1315  FREET4 0.65   Anemia Panel: No results for input(s): VITAMINB12, FOLATE, FERRITIN, TIBC, IRON, RETICCTPCT in the last 72 hours. Sepsis Labs: Recent Labs  Lab 05/27/19 2327 05/28/19 0224 05/28/19 1822 05/28/19 2108  LATICACIDVEN 2.0* 2.0* 2.0* 1.5    Recent Results (from the past 240 hour(s))  SARS CORONAVIRUS 2 (TAT 6-24 HRS) Nasopharyngeal Nasopharyngeal Swab     Status: None   Collection Time: 05/28/19  5:07 AM   Specimen: Nasopharyngeal Swab  Result Value Ref Range Status   SARS Coronavirus 2 NEGATIVE NEGATIVE Final    Comment: (NOTE) SARS-CoV-2 target nucleic acids are NOT DETECTED. The SARS-CoV-2 RNA is generally detectable in upper and lower respiratory specimens during the acute phase of infection.  Negative results do not preclude SARS-CoV-2 infection, do not rule out co-infections with other pathogens, and should not be used as the sole basis for treatment or other patient management decisions. Negative results must be combined with clinical observations, patient history, and epidemiological information. The expected result is Negative. Fact Sheet for Patients: SugarRoll.be Fact Sheet for Healthcare Providers: https://www.woods-mathews.com/ This test is not yet approved or cleared by the [place] FDA and  has been authorized for detection and/or diagnosis of SARS-CoV-2 by FDA under an Emergency Use Authorization (EUA). This EUA will remain  in effect (meaning this test can be used) for the duration of the COVID-19 declaration under Section 56 4(b)(1) of the Act, 21 U.S.C. section 360bbb-3(b)(1), unless the authorization is terminated or revoked sooner. Performed at [facility], [place] [address] [place], [place] [address]   Culture, blood (routine x 2)     Status: None (Preliminary result)   Collection Time: 05/28/19  6:24 AM   Specimen: BLOOD LEFT WRIST  Result Value Ref Range Status   Specimen Description BLOOD LEFT WRIST  Final   Special Requests   Final    BOTTLES DRAWN AEROBIC AND ANAEROBIC Blood Culture results may not be optimal due to an inadequate volume of blood received in culture bottles   Culture   Final    NO GROWTH 2 DAYS Performed at [facility], [place] [address], [place], [place] [address]    Report Status PENDING  Incomplete  Culture, blood (routine x 2)     Status: None (Preliminary result)   Collection Time: 05/28/19  6:40 AM   Specimen: BLOOD  Result Value Ref Range Status   Specimen Description BLOOD LEFT ANTECUBITAL  Final   Special Requests   Final    BOTTLES DRAWN AEROBIC AND ANAEROBIC Blood Culture adequate volume   Culture   Final    NO GROWTH 2 DAYS Performed at [place], [place] [address], [place], [place] [address]    Report Status PENDING  Incomplete  Urine Culture     Status: Abnormal   Collection Time: 05/28/19 11:17 AM   Specimen: Urine, Random  Result Value Ref Range Status   Specimen Description URINE, RANDOM  Final   Special Requests   Final    NONE Performed at [facility], [place] [address], [place], [place] [address]    Culture 30,000 COLONIES/mL ESCHERICHIA COLI (A)  Final   Report Status 05/30/2019 FINAL  Final   Organism ID, Bacteria ESCHERICHIA COLI (A)  Final      Susceptibility   Escherichia coli - MIC*    AMPICILLIN <=2 SENSITIVE Sensitive  CEFAZOLIN <=4 SENSITIVE Sensitive     CEFTRIAXONE <=1 SENSITIVE Sensitive     CIPROFLOXACIN <=0.25 SENSITIVE Sensitive     GENTAMICIN <=1 SENSITIVE Sensitive     IMIPENEM <=0.25 SENSITIVE Sensitive     NITROFURANTOIN <=16 SENSITIVE Sensitive     TRIMETH/SULFA <=20 SENSITIVE Sensitive     AMPICILLIN/SULBACTAM <=2 SENSITIVE Sensitive     PIP/TAZO <=4 SENSITIVE Sensitive     Extended ESBL NEGATIVE Sensitive     * 30,000 COLONIES/mL ESCHERICHIA COLI     Radiology Studies: No results found.  Scheduled Meds: . Chlorhexidine Gluconate Cloth  6 each Topical Daily  . dexamethasone  2 mg Oral Daily  . feeding supplement (ENSURE ENLIVE)  237 mL Oral TID BM  . fluconazole  400 mg Oral Daily  . lidocaine   Topical Once  . nystatin   Topical TID  . sodium chloride flush  10-40 mL Intracatheter Q12H   Continuous Infusions: . cefTRIAXone (ROCEPHIN)  IV 1 g (05/30/19 0905)     LOS: 2 days   Marylu Lund, MD [facility] Pager On Amion  If 7PM-7AM, please contact night-coverage 05/30/2019, 4:26 PM

## 2019-05-31 LAB — CBC
HCT: 26.5 % — ABNORMAL LOW (ref 36.0–46.0)
Hemoglobin: 8.7 g/dL — ABNORMAL LOW (ref 12.0–15.0)
MCH: 31.3 pg (ref 26.0–34.0)
MCHC: 32.8 g/dL (ref 30.0–36.0)
MCV: 95.3 fL (ref 80.0–100.0)
Platelets: 54 10*3/uL — ABNORMAL LOW (ref 150–400)
RBC: 2.78 MIL/uL — ABNORMAL LOW (ref 3.87–5.11)
RDW: 18.1 % — ABNORMAL HIGH (ref 11.5–15.5)
WBC: 1.8 10*3/uL — ABNORMAL LOW (ref 4.0–10.5)
nRBC: 0 % (ref 0.0–0.2)

## 2019-05-31 LAB — COMPREHENSIVE METABOLIC PANEL
ALT: 36 U/L (ref 0–44)
AST: 22 U/L (ref 15–41)
Albumin: 2.3 g/dL — ABNORMAL LOW (ref 3.5–5.0)
Alkaline Phosphatase: 45 U/L (ref 38–126)
Anion gap: 7 (ref 5–15)
BUN: 36 mg/dL — ABNORMAL HIGH (ref 8–23)
CO2: 21 mmol/L — ABNORMAL LOW (ref 22–32)
Calcium: 8.6 mg/dL — ABNORMAL LOW (ref 8.9–10.3)
Chloride: 114 mmol/L — ABNORMAL HIGH (ref 98–111)
Creatinine, Ser: 1.19 mg/dL — ABNORMAL HIGH (ref 0.44–1.00)
GFR calc Af Amer: 56 mL/min — ABNORMAL LOW (ref >60)
GFR calc non Af Amer: 49 mL/min — ABNORMAL LOW (ref >60)
Glucose, Bld: 95 mg/dL (ref 70–99)
Potassium: 4.2 mmol/L (ref 3.5–5.1)
Sodium: 142 mmol/L (ref 135–145)
Total Bilirubin: 0.5 mg/dL (ref 0.3–1.2)
Total Protein: 5.2 g/dL — ABNORMAL LOW (ref 6.5–8.1)

## 2019-05-31 MED ORDER — DEXAMETHASONE 2 MG PO TABS: ORAL_TABLET | ORAL | Status: DC

## 2019-05-31 MED ORDER — FLUCONAZOLE 200 MG PO TABS: 400.0000 mg | ORAL_TABLET | Freq: Every day | ORAL | 0 refills | Status: DC

## 2019-05-31 MED ORDER — CEFDINIR 300 MG PO CAPS: 300.0000 mg | ORAL_CAPSULE | Freq: Two times a day (BID) | ORAL | 0 refills | Status: DC

## 2019-05-31 MED ORDER — NYSTATIN 100000 UNIT/GM EX POWD: Freq: Three times a day (TID) | CUTANEOUS | 0 refills | Status: DC

## 2019-05-31 NOTE — TOC Transition Note (Signed)
Transition of Care [facility]) - CM/SW Discharge Note   Patient Details  Name: Kayla Price MRN: [id number] Date of Birth: 19-Jul-1955  Transition of Care [facility]) CM/SW Contact:  Claudie Leach, RN Phone Number: [phone] 05/31/2019, 3:46 PM   Clinical Narrative:    Patient to d/c home with [facility]. Reviewed Medicare rated list with patient.  Patient has used [facility] in the past and would like to use again.  Referral accepted by [facility] with [facility].    Patient requests rolling walker with seat.  DME will be delivered to room prior to d/c.    Final next level of care: [place] Barriers to Discharge: No Barriers Identified   Patient Goals and CMS Choice Patient states their goals for this hospitalization and ongoing recovery are:: to get home CMS Medicare.gov Compare Post Acute Care list provided to:: Patient Choice offered to / list presented to : Patient   Discharge Plan and Services                DME Arranged: Walker rolling with seat DME Agency: [employer] Date DME Agency Contacted: 05/31/19 Time DME Agency Contacted: [phone] Representative spoke with at DME Agency: [place]: RN, PT, Nurse's Aide [place] Agency: [facility] (formerly [employer]) Date [place]: 05/31/19 Time [place]: [place] Representative spoke with at [place]: Alwyn Ren

## 2019-05-31 NOTE — Discharge Summary (Signed)
Physician Discharge Summary  Kayla Price CXK:[id number] DOB: May 14, 1956 DOA: 05/27/2019  PCP: Lauree Chandler, NP  Admit date: 05/27/2019 Discharge date: 05/31/2019  Admitted From: Home Disposition:  Home  Recommendations for Outpatient Follow-up:  1. Follow up with PCP in 1-2 weeks 2. Follow up with Oncology as scheduled 3. Please note, patient's benicar is on hold at time of d/c given hypotension, please resume as blood pressure tolerates  Home Health:PT, RN, Aide  Equipment/Devices:rolling walker    Discharge Condition:Improved CODE STATUS:*DNR Diet recommendation: Regular   Brief/Interim Summary: 63 y.o.femalewith medical history significant ofmetastatic breast cancer currently on chemo, HIV, hypertension, hyperlipidemia, CKD, anemia of chronic disease presenting to the ED for evaluation of generalized weakness and buttock ulcer.Patient states since she left the hospital last month she has been wearing pull-ups. Her family has noted that she has ulcers in her genital and buttock region. These ulcers are painful whenever she tries to move. It burns when she urinates. She has not had any fevers. She has not been sexually active since 2005. No cough or shortness of breath. She has not been eating much. No nausea, vomiting, abdominal pain, or diarrhea. States she was previously taking both olmesartan and hydrochlorothiazide. She had an appointment with her PCP a few days ago and hydrochlorothiazide was stopped. Since then, she is only taking olmesartan.  ED Course:Hypotensive with recorded blood pressure as low as 76/55. Afebrile and not tachycardic. No leukocytosis. Lactic acid 2.0>2.0. Hemoglobin 11.2, stable. Platelet count 54,000, was 86,000 on 11/24. Potassium 5.4. Bicarb 19, anion gap 7. Blood glucose 112. BUN 147, creatinine 3.1. UA with moderate amount of leukocytes, 11-20 WBCs, and rare bacteria. Blood culture x2 pending. 1 L normal saline bolus  ordered.Hycetgiven for pain.  Discharge Diagnoses:  Principal Problem:   AKI (acute kidney injury) ([place]) Active Problems:   BREAST CANCER, HX OF   Hyperkalemia   UTI (urinary tract infection)   Hypotension   ARF (acute renal failure) ([facility])   AKI on CKD stage III -Suspectprerenal due to decreased p.o. intake/dehydration anddiuretic/ARB use. BUN 147, creatinine 3.1 at presentation. Baseline creatinine 1.3-1.4. -Held diuretic/ARB. Avoid nephrotoxic agents/contrast. -Renal US reviewed and is unremarkable -Renal function now much resolved  Mild hyperkalemia -Likely related to AKI and ARB use. Potassium 5.4 at presentation -Held ARB -resolved  Mild normal anion gap metabolic acidosis Bicarb 19, anion gap 7.Likely related to AKI and diuretic use. -Holding diuretic for now -Improved  UTI UA with moderate amount of leukocytes, 11-20 WBCs, and rare bacteria. -Continued on empiric Ceftriaxone -Urine culture with 30,000 gm pan sensitive ecoli -clinically improving -Would complete course with omnicef on d/c  Hypotension -Hypotensive with recorded blood pressure as low as 76/55.  -Pt remains afebrile and not tachycardic. No leukocytosis.  -Resolved with multiple IVF boluses and 50mg  IV hydrocortisone -appreciate input by PCCM -Pt had recently finished a decadron taper -Will continue decadron for now and anticipate slower taper  -BP at time of d/c of 122/88  Superficial skin breakdown/ulcers in the genital/groin/buttocks region, ?intertrigo -No signs of cellulitis noted at time of presentation -Receiving ceftriaxone for UTI as mentioned above -Wound care consulted -Nystatin powder was ordered on presentation -Norco as needed for pain -Tylenol as needed -Discussed with WOC. Recommendation for systemic antifungal.  HIV -Followed by infectious disease. Last CD4 count475 on 10/26. -Remains stable at this time  Hypertension -Holding irbesartan and  hydrochlorothiazide given AKI -Hypotensive overnight,see above. Have resumed decadron taper -cont to follow  Hypothyroidism -TSH normal on  04/03/2019. -Continue Synthroid as pt tolerates  Anemia of chronic disease -Hemoglobin 11.2 on presentation. -No signs of active bleeding.  Metastatic breast cancer currently on chemo -Oncology following  Worsening thrombocytopenia Possibly related to chemo.Platelet count 54,000, was 86,000 on 11/24.No signs of active bleeding. -Continue to monitor CBC -Avoid anticoagulation/antiplatelet agents  Discharge Instructions   Allergies as of 05/31/2019      Reactions   Lisinopril Anaphylaxis, Swelling   Swelling of tongue and mouth 11/05/16- tolerates Olmesartan   Pepcid [famotidine] Other (See Comments)   PPI H2, BLOCKERS LOWER GASTRIC PH WHICH WOULD LEAD TO SUBTHERAPEUTIC RILPIVIRINE LEVELS AND POTENTIAL VIROLOGICAL FAILURE WITH RESISTANCE   Prilosec [omeprazole] Other (See Comments)   PPI H2, BLOCKERS LOWER GASTRIC PH WHICH WOULD LEAD TO SUBTHERAPEUTIC RILPIVIRINE LEVELS AND POTENTIAL VIROLOGICAL FAILURE WITH RESISTANCE   Tums [calcium Carbonate Antacid] Other (See Comments)   TUMS ANTACIDS CAN LOWER GASTRIC PH WHICH COULD  LEAD TO SUBTHERAPEUTIC RILPIVIRINE LEVELS AND POTENTIAL VIROLOGICAL FAILURE WITH RESISTANCE TUMS CAN BE GIVEN BUT NEED CONSULT WITH ID PHARMACY RE TIMING. I PREFER HER TO AVOID ALL TOGETHER      Medication List    STOP taking these medications   olmesartan 40 MG tablet Commonly known as: BENICAR   olmesartan-hydrochlorothiazide 40-25 MG tablet Commonly known as: BENICAR HCT   REPRIEVE [id number] pitavastatin or placebo 4 mg Tabs tablet     TAKE these medications   acetaminophen 500 MG tablet Commonly known as: TYLENOL Take 500 mg by mouth every 6 (six) hours as needed (inflammation).   BENGAY EX Apply 1 application topically daily as needed (muscle pain).   cefdinir 300 MG capsule Commonly known as:  OMNICEF Take 1 capsule (300 mg total) by mouth 2 (two) times daily for 3 days.   dexamethasone 2 MG tablet Commonly known as: DECADRON Taper dose: 2mg  po daily x 3 days, then 1mg  po daily x 3 days, then stop. Zero refills What changed:   medication strength  additional instructions   ELDERBERRY PO Take 2 tablets by mouth daily.   feeding supplement (ENSURE ENLIVE) Liqd Take 237 mLs by mouth 3 (three) times daily between meals. What changed: when to take this   fluconazole 200 MG tablet Commonly known as: DIFLUCAN Take 2 tablets (400 mg total) by mouth daily for 2 days. Start taking on: June 01, 2019   HYDROcodone-acetaminophen 5-325 MG tablet Commonly known as: NORCO/VICODIN TAKE ONE TABLET BY MOUTH 4 TIMES A DAY AS INSTRUCTED What changed:   how much to take  how to take this  when to take this  additional instructions   Juluca 50-25 MG Tabs Generic drug: Dolutegravir-Rilpivirine TAKE 1 TABLET BY MOUTH DAILY WITH BREAKFAST. TAKE WITH THE PREZCOBIX. What changed: See the new instructions.   levothyroxine 200 MCG tablet Commonly known as: SYNTHROID Take 1 tablet (200 mcg total) by mouth daily before breakfast.   lidocaine-prilocaine cream Commonly known as: EMLA APPLY 1 APPLICATION TOPICALLY AS NEEDED. APPLY TO PORT SITE 1 HOUR PRIOR TO ACCESS What changed: reasons to take this   Loratadine 10 MG Caps Take 1 capsule by mouth as needed.   methocarbamol 500 MG tablet Commonly known as: ROBAXIN Take 1 tablet (500 mg total) by mouth every 8 (eight) hours as needed for muscle spasms.   MULTIVITAMIN ADULT PO Take 1 tablet by mouth daily.   nystatin powder Commonly known as: MYCOSTATIN/NYSTOP Apply topically 3 (three) times daily. To affected areas   Prezcobix 800-150 MG tablet Generic drug: darunavir-cobicistat TAKE  1 TABLET BY MOUTH DAILY. SWALLOW WHOLE. DO NOT CRUSH, BREAK OR CHEW TABLETS. TAKE WITH FOOD. What changed: See the new instructions.    Selzentry 150 MG tablet Generic drug: maraviroc TAKE ONE TABLET BY MOUTH TWICE DAILY What changed: how much to take   SYSTANE BALANCE OP Place 1 drop into both eyes 4 (four) times daily as needed.            Durable Medical Equipment  (From admission, onward)         Start     Ordered   05/31/19 1428  For home use only DME 4 wheeled rolling walker with seat  Once    Question:  Patient needs a walker to treat with the following condition  Answer:  Weakness   05/31/19 1427         Follow-up Information    Lauree Chandler, NP. Schedule an appointment as soon as possible for a visit in 1 week(s).   Specialty: Geriatric Medicine Contact information: [place]. [place] [address] [phone]        Tommy Medal, Lavell Islam, MD .   Specialty: Infectious Diseases Contact information: [place]. [place] [address] [phone]          Allergies  Allergen Reactions  . Lisinopril Anaphylaxis and Swelling    Swelling of tongue and mouth 11/05/16- tolerates Olmesartan  . Pepcid [Famotidine] Other (See Comments)    PPI H2, BLOCKERS LOWER GASTRIC PH WHICH WOULD LEAD TO SUBTHERAPEUTIC RILPIVIRINE LEVELS AND POTENTIAL VIROLOGICAL FAILURE WITH RESISTANCE  . Prilosec [Omeprazole] Other (See Comments)    PPI H2, BLOCKERS LOWER GASTRIC PH WHICH WOULD LEAD TO SUBTHERAPEUTIC RILPIVIRINE LEVELS AND POTENTIAL VIROLOGICAL FAILURE WITH RESISTANCE  . Tums [Calcium Carbonate Antacid] Other (See Comments)    TUMS ANTACIDS CAN LOWER GASTRIC PH WHICH COULD  LEAD TO SUBTHERAPEUTIC RILPIVIRINE LEVELS AND POTENTIAL VIROLOGICAL FAILURE WITH RESISTANCE TUMS CAN BE GIVEN BUT NEED CONSULT WITH ID PHARMACY RE TIMING. I PREFER HER TO AVOID ALL TOGETHER    Consultations:  Oncology  Procedures/Studies: US Renal  Result Date: 05/28/2019 CLINICAL DATA:  Acute kidney injury. EXAM: RENAL / URINARY TRACT ULTRASOUND COMPLETE COMPARISON:  PET CT scan 03/11/2019. FINDINGS:  Right Kidney: Renal measurements: 9.9 x 4.3 x 5.0 cm = volume: 109.7 mL . Echogenicity within normal limits. No mass or hydronephrosis visualized. Left Kidney: Renal measurements: 9.6 x 5.4 x 4.9 cm = volume: Is 134.3. mL. Echogenicity within normal limits. No mass or hydronephrosis visualized. Bladder: Appears normal for degree of bladder distention. Other: Small right pleural effusion and gallstones are noted as seen on the prior exam. IMPRESSION: Negative for hydronephrosis.  Normal appearing kidneys. Right pleural effusion. Gallstones. Electronically Signed   By: Inge Rise M.D.   On: 05/28/2019 07:15     Subjective: Reports feeling much better  Discharge Exam: Vitals:   05/31/19 0545 05/31/19 1400  BP: 98/64 90/65  Pulse: 78   Resp: 18   Temp: 97.8 F (36.6 C)   SpO2: 98%    Vitals:   05/30/19 2121 05/30/19 2300 05/31/19 0545 05/31/19 1400  BP: 92/65  98/64 90/65  Pulse: 86  78   Resp: 12 17 18    Temp: 98.3 F (36.8 C)  97.8 F (36.6 C)   TempSrc: Oral  Oral   SpO2: 100%  98%   Weight:   90.7 kg   Height:        General: Pt is alert, awake, not in  acute distress Cardiovascular: RRR, S1/S2 +, no rubs, no gallops Respiratory: CTA bilaterally, no wheezing, no rhonchi Abdominal: Soft, NT, ND, bowel sounds + Extremities: no edema, no cyanosis   The results of significant diagnostics from this hospitalization (including imaging, microbiology, ancillary and laboratory) are listed below for reference.     Microbiology: Recent Results (from the past 240 hour(s))  SARS CORONAVIRUS 2 (TAT 6-24 HRS) Nasopharyngeal Nasopharyngeal Swab     Status: None   Collection Time: 05/28/19  5:07 AM   Specimen: Nasopharyngeal Swab  Result Value Ref Range Status   SARS Coronavirus 2 NEGATIVE NEGATIVE Final    Comment: (NOTE) SARS-CoV-2 target nucleic acids are NOT DETECTED. The SARS-CoV-2 RNA is generally detectable in upper and lower respiratory specimens during the acute phase  of infection. Negative results do not preclude SARS-CoV-2 infection, do not rule out co-infections with other pathogens, and should not be used as the sole basis for treatment or other patient management decisions. Negative results must be combined with clinical observations, patient history, and epidemiological information. The expected result is Negative. Fact Sheet for Patients: SugarRoll.be Fact Sheet for Healthcare Providers: https://www.woods-mathews.com/ This test is not yet approved or cleared by the [place] FDA and  has been authorized for detection and/or diagnosis of SARS-CoV-2 by FDA under an Emergency Use Authorization (EUA). This EUA will remain  in effect (meaning this test can be used) for the duration of the COVID-19 declaration under Section 56 4(b)(1) of the Act, 21 U.S.C. section 360bbb-3(b)(1), unless the authorization is terminated or revoked sooner. Performed at [facility], [place] [address], [place], [place] [address]   Culture, blood (routine x 2)     Status: None (Preliminary result)   Collection Time: 05/28/19  6:24 AM   Specimen: BLOOD LEFT WRIST  Result Value Ref Range Status   Specimen Description BLOOD LEFT WRIST  Final   Special Requests   Final    BOTTLES DRAWN AEROBIC AND ANAEROBIC Blood Culture results may not be optimal due to an inadequate volume of blood received in culture bottles   Culture   Final    NO GROWTH 3 DAYS Performed at [facility], [place] [address], [place], [place] [address]    Report Status PENDING  Incomplete  Culture, blood (routine x 2)     Status: None (Preliminary result)   Collection Time: 05/28/19  6:40 AM   Specimen: BLOOD  Result Value Ref Range Status   Specimen Description BLOOD LEFT ANTECUBITAL  Final   Special Requests   Final    BOTTLES DRAWN AEROBIC AND ANAEROBIC Blood Culture adequate volume   Culture   Final    NO GROWTH 3 DAYS Performed at [facility], [place] [address] [place], [place] [address]    Report Status PENDING  Incomplete  Urine Culture     Status: Abnormal   Collection Time: 05/28/19 11:17 AM   Specimen: Urine, Random  Result Value Ref Range Status   Specimen Description URINE, RANDOM  Final   Special Requests   Final    NONE Performed at [facility], [place] [address] [place], [place] [address]    Culture 30,000 COLONIES/mL ESCHERICHIA COLI (A)  Final   Report Status 05/30/2019 FINAL  Final   Organism ID, Bacteria ESCHERICHIA COLI (A)  Final      Susceptibility   Escherichia coli - MIC*    AMPICILLIN <=2 SENSITIVE Sensitive     CEFAZOLIN <=4 SENSITIVE  Sensitive     CEFTRIAXONE <=1 SENSITIVE Sensitive     CIPROFLOXACIN <=0.25 SENSITIVE Sensitive     GENTAMICIN <=1 SENSITIVE Sensitive     IMIPENEM <=0.25 SENSITIVE Sensitive     NITROFURANTOIN <=16 SENSITIVE Sensitive     TRIMETH/SULFA <=20 SENSITIVE Sensitive     AMPICILLIN/SULBACTAM <=2 SENSITIVE Sensitive     PIP/TAZO <=4 SENSITIVE Sensitive     Extended ESBL NEGATIVE Sensitive     * 30,000 COLONIES/mL ESCHERICHIA COLI     Labs: BNP (last 3 results) No results for input(s): BNP in the last 8760 hours. Basic Metabolic Panel: Recent Labs  Lab 05/27/19 2327 05/28/19 1822 05/29/19 0551 05/30/19 0515 05/31/19 0500  NA 138 143 144 147* 142  K 5.4* 4.2 3.9 4.1 4.2  CL 112* 118* 119* 120* 114*  CO2 19* 19* 18* 20* 21*  GLUCOSE 112* 107* 104* 118* 95  BUN 147* 96* 72* 50* 36*  CREATININE 3.14* 1.88* 1.39* 1.15* 1.19*  CALCIUM 9.3 8.5* 8.5* 8.8* 8.6*   Liver Function Tests: Recent Labs  Lab 05/27/19 2327 05/30/19 0515 05/31/19 0500  AST 26 22 22   ALT 46* 37 36  ALKPHOS 48 52 45  BILITOT 1.0 0.6 0.5  PROT 6.2* 5.2* 5.2*  ALBUMIN 3.3* 2.4* 2.3*   No results for input(s): LIPASE, AMYLASE in the last 168 hours. No results for input(s): AMMONIA in the last 168 hours. CBC: Recent Labs  Lab 05/27/19 2327 05/29/19 0551 05/30/19 0515  05/31/19 0500  WBC 6.4 2.7* 1.8* 1.8*  NEUTROABS 5.9  --   --   --   HGB 11.2* 8.8* 8.9* 8.7*  HCT 34.4* 27.2* 27.5* 26.5*  MCV 95.6 96.8 96.5 95.3  PLT 54* 40* 49* 54*   Cardiac Enzymes: No results for input(s): CKTOTAL, CKMB, CKMBINDEX, TROPONINI in the last 168 hours. BNP: Invalid input(s): POCBNP CBG: No results for input(s): GLUCAP in the last 168 hours. D-Dimer No results for input(s): DDIMER in the last 72 hours. Hgb A1c No results for input(s): HGBA1C in the last 72 hours. Lipid Profile No results for input(s): CHOL, HDL, LDLCALC, TRIG, CHOLHDL, LDLDIRECT in the last 72 hours. Thyroid function studies No results for input(s): TSH, T4TOTAL, T3FREE, THYROIDAB in the last 72 hours.  Invalid input(s): FREET3 Anemia work up No results for input(s): VITAMINB12, FOLATE, FERRITIN, TIBC, IRON, RETICCTPCT in the last 72 hours. Urinalysis    Component Value Date/Time   COLORURINE YELLOW 05/28/2019 0025   APPEARANCEUR HAZY (A) 05/28/2019 0025   LABSPEC 1.014 05/28/2019 0025   PHURINE 5.0 05/28/2019 0025   GLUCOSEU NEGATIVE 05/28/2019 0025   GLUCOSEU NEG mg/dL 07/23/2006 2113   HGBUR MODERATE (A) 05/28/2019 0025   BILIRUBINUR NEGATIVE 05/28/2019 0025   KETONESUR NEGATIVE 05/28/2019 0025   PROTEINUR NEGATIVE 05/28/2019 0025   UROBILINOGEN 1 07/23/2006 2113   NITRITE NEGATIVE 05/28/2019 0025   LEUKOCYTESUR MODERATE (A) 05/28/2019 0025   Sepsis Labs Invalid input(s): PROCALCITONIN,  WBC,  LACTICIDVEN Microbiology Recent Results (from the past 240 hour(s))  SARS CORONAVIRUS 2 (TAT 6-24 HRS) Nasopharyngeal Nasopharyngeal Swab     Status: None   Collection Time: 05/28/19  5:07 AM   Specimen: Nasopharyngeal Swab  Result Value Ref Range Status   SARS Coronavirus 2 NEGATIVE NEGATIVE Final    Comment: (NOTE) SARS-CoV-2 target nucleic acids are NOT DETECTED. The SARS-CoV-2 RNA is generally detectable in upper and lower respiratory specimens during the acute phase of infection.  Negative results do not preclude SARS-CoV-2 infection, do not rule  out co-infections with other pathogens, and should not be used as the sole basis for treatment or other patient management decisions. Negative results must be combined with clinical observations, patient history, and epidemiological information. The expected result is Negative. Fact Sheet for Patients: SugarRoll.be Fact Sheet for Healthcare Providers: https://www.woods-mathews.com/ This test is not yet approved or cleared by the [place] FDA and  has been authorized for detection and/or diagnosis of SARS-CoV-2 by FDA under an Emergency Use Authorization (EUA). This EUA will remain  in effect (meaning this test can be used) for the duration of the COVID-19 declaration under Section 56 4(b)(1) of the Act, 21 U.S.C. section 360bbb-3(b)(1), unless the authorization is terminated or revoked sooner. Performed at [facility], [place] [address], [place], [place] [address]   Culture, blood (routine x 2)     Status: None (Preliminary result)   Collection Time: 05/28/19  6:24 AM   Specimen: BLOOD LEFT WRIST  Result Value Ref Range Status   Specimen Description BLOOD LEFT WRIST  Final   Special Requests   Final    BOTTLES DRAWN AEROBIC AND ANAEROBIC Blood Culture results may not be optimal due to an inadequate volume of blood received in culture bottles   Culture   Final    NO GROWTH 3 DAYS Performed at [facility], [place] [address], [place], [place] [address]    Report Status PENDING  Incomplete  Culture, blood (routine x 2)     Status: None (Preliminary result)   Collection Time: 05/28/19  6:40 AM   Specimen: BLOOD  Result Value Ref Range Status   Specimen Description BLOOD LEFT ANTECUBITAL  Final   Special Requests   Final    BOTTLES DRAWN AEROBIC AND ANAEROBIC Blood Culture adequate volume   Culture   Final    NO GROWTH 3 DAYS Performed at [place], [place] [address], [place], [place] [address]    Report Status PENDING  Incomplete  Urine Culture     Status: Abnormal   Collection Time: 05/28/19 11:17 AM   Specimen: Urine, Random  Result Value Ref Range Status   Specimen Description URINE, RANDOM  Final   Special Requests   Final    NONE Performed at [facility], [place] [address] [place], [place] [address]    Culture 30,000 COLONIES/mL ESCHERICHIA COLI (A)  Final   Report Status 05/30/2019 FINAL  Final   Organism ID, Bacteria ESCHERICHIA COLI (A)  Final      Susceptibility   Escherichia coli - MIC*    AMPICILLIN <=2 SENSITIVE Sensitive     CEFAZOLIN <=4 SENSITIVE Sensitive     CEFTRIAXONE <=1 SENSITIVE Sensitive     CIPROFLOXACIN <=0.25 SENSITIVE Sensitive     GENTAMICIN <=1 SENSITIVE Sensitive     IMIPENEM <=0.25 SENSITIVE Sensitive     NITROFURANTOIN <=16 SENSITIVE Sensitive     TRIMETH/SULFA <=20 SENSITIVE Sensitive     AMPICILLIN/SULBACTAM <=2 SENSITIVE Sensitive     PIP/TAZO <=4 SENSITIVE Sensitive     Extended ESBL NEGATIVE Sensitive     * 30,000 COLONIES/mL ESCHERICHIA COLI   Time spent: 30 min  SIGNED:   Marylu Lund, MD  [facility] 05/31/2019, 4:05 PM  If 7PM-7AM, please contact night-coverage

## 2019-05-31 NOTE — Plan of Care (Signed)
  Problem: Clinical Measurements: Goal: Respiratory complications will improve Outcome: Progressing Note: Stable on room air.  No s/s of respiratory complications noted. Goal: Cardiovascular complication will be avoided Outcome: Progressing Note: NSR on telemetry.  No s/s of cardiovascular complication noted.

## 2019-05-31 NOTE — Discharge Instructions (Signed)
Hypotension As your heart beats, it forces blood through your body. This force is called blood pressure. If you have hypotension, you have low blood pressure. When your blood pressure is too low, you may not get enough blood to your brain or other parts of your body. This may cause you to feel weak, light-headed, have a fast heartbeat, or even pass out (faint). Low blood pressure may be harmless, or it may cause serious problems. What are the causes?  Blood loss.  Not enough water in the body (dehydration).  Heart problems.  Hormone problems.  Pregnancy.  A very bad infection.  Not having enough of certain nutrients.  Very bad allergic reactions.  Certain medicines. What increases the risk?  Age. The risk increases as you get older.  Conditions that affect the heart or the brain and spinal cord (central nervous system).  Taking certain medicines.  Being pregnant. What are the signs or symptoms?  Feeling: ? Weak. ? Light-headed. ? Dizzy. ? Tired (fatigued).  Blurred vision.  Fast heartbeat.  Passing out, in very bad cases. How is this treated?  Changing your diet. This may involve eating more salt (sodium) or drinking more water.  Taking medicines to raise your blood pressure.  Changing how much you take (the dosage) of some of your medicines.  Wearing compression stockings. These stockings help to prevent blood clots and reduce swelling in your legs. In some cases, you may need to go to the hospital for:  Fluid replacement. This means you will receive fluids through an IV tube.  Blood replacement. This means you will receive donated blood through an IV tube (transfusion).  Treating an infection or heart problems, if this applies.  Monitoring. You may need to be monitored while medicines that you are taking wear off. Follow these instructions at home: Eating and drinking   Drink enough fluids to keep your pee (urine) pale yellow.  Eat a healthy diet.  Follow instructions from your doctor about what you can eat or drink. A healthy diet includes: ? Fresh fruits and vegetables. ? Whole grains. ? Low-fat (lean) meats. ? Low-fat dairy products.  Eat extra salt only as told. Do not add extra salt to your diet unless your doctor tells you to.  Eat small meals often.  Avoid standing up quickly after you eat. Medicines  Take over-the-counter and prescription medicines only as told by your doctor. ? Follow instructions from your doctor about changing how much you take of your medicines, if this applies. ? Do not stop or change any of your medicines on your own. General instructions   Wear compression stockings as told by your doctor.  Get up slowly from lying down or sitting.  Avoid hot showers and a lot of heat as told by your doctor.  Return to your normal activities as told by your doctor. Ask what activities are safe for you.  Do not use any products that contain nicotine or tobacco, such as cigarettes, e-cigarettes, and chewing tobacco. If you need help quitting, ask your doctor.  Keep all follow-up visits as told by your doctor. This is important. Contact a doctor if:  You throw up (vomit).  You have watery poop (diarrhea).  You have a fever for more than 2-3 days.  You feel more thirsty than normal.  You feel weak and tired. Get help right away if:  You have chest pain.  You have a fast or uneven heartbeat.  You lose feeling (have numbness) in any  part of your body.  You cannot move your arms or your legs.  You have trouble talking.  You get sweaty or feel light-headed.  You pass out.  You have trouble breathing.  You have trouble staying awake.  You feel mixed up (confused). Summary  Hypotension is also called low blood pressure. It is when the force of blood pumping through your arteries is too weak.  Hypotension may be harmless, or it may cause serious problems.  Treatment may include changing  your diet and medicines, and wearing compression stockings.  In very bad cases, you may need to go to the hospital. This information is not intended to replace advice given to you by your health care provider. Make sure you discuss any questions you have with your health care provider. Document Released: 09/05/2009 Document Revised: 12/05/2017 Document Reviewed: 12/05/2017 Elsevier Patient Education  [place] Heart-healthy meal planning includes:  Eating less unhealthy fats.  Eating more healthy fats.  Making other changes in your diet. Talk with your doctor or a diet specialist (dietitian) to create an eating plan that is right for you. What is my plan? Your doctor may recommend an eating plan that includes:  Total fat: ______% or less of total calories a day.  Saturated fat: ______% or less of total calories a day.  Cholesterol: less than _________mg a day. What are tips for following this plan? Cooking Avoid frying your food. Try to bake, boil, grill, or broil it instead. You can also reduce fat by:  Removing the skin from poultry.  Removing all visible fats from meats.  Steaming vegetables in water or broth. Meal planning   At meals, divide your plate into four equal parts: ? Fill one-half of your plate with vegetables and green salads. ? Fill one-fourth of your plate with whole grains. ? Fill one-fourth of your plate with lean protein foods.  Eat 4-5 servings of vegetables per day. A serving of vegetables is: ? 1 cup of raw or cooked vegetables. ? 2 cups of raw leafy greens.  Eat 4-5 servings of fruit per day. A serving of fruit is: ? 1 medium whole fruit. ?  cup of dried fruit. ?  cup of fresh, frozen, or canned fruit. ?  cup of 100% fruit juice.  Eat more foods that have soluble fiber. These are apples, broccoli, carrots, beans, peas, and barley. Try to get 20-30 g of fiber per day.  Eat 4-5 servings of nuts,  legumes, and seeds per week: ? 1 serving of dried beans or legumes equals  cup after being cooked. ? 1 serving of nuts is  cup. ? 1 serving of seeds equals 1 tablespoon. General information  Eat more home-cooked food. Eat less restaurant, buffet, and fast food.  Limit or avoid alcohol.  Limit foods that are high in starch and sugar.  Avoid fried foods.  Lose weight if you are overweight.  Keep track of how much salt (sodium) you eat. This is important if you have high blood pressure. Ask your doctor to tell you more about this.  Try to add vegetarian meals each week. Fats  Choose healthy fats. These include olive oil and canola oil, flaxseeds, walnuts, almonds, and seeds.  Eat more omega-3 fats. These include salmon, mackerel, sardines, tuna, flaxseed oil, and ground flaxseeds. Try to eat fish at least 2 times each week.  Check food labels. Avoid foods with trans fats or high amounts of saturated fat.  Limit saturated fats. ? These are often found in animal products, such as meats, butter, and cream. ? These are also found in plant foods, such as palm oil, palm kernel oil, and coconut oil.  Avoid foods with partially hydrogenated oils in them. These have trans fats. Examples are stick margarine, some tub margarines, cookies, crackers, and other baked goods. What foods can I eat? Fruits All fresh, canned (in natural juice), or frozen fruits. Vegetables Fresh or frozen vegetables (raw, steamed, roasted, or grilled). Green salads. Grains Most grains. Choose whole wheat and whole grains most of the time. Rice and pasta, including brown rice and pastas made with whole wheat. Meats and other proteins Lean, well-trimmed beef, veal, pork, and lamb. Chicken and [place] without skin. All fish and shellfish. Wild duck, rabbit, pheasant, and venison. Egg whites or low-cholesterol egg substitutes. Dried beans, peas, lentils, and tofu. Seeds and most nuts. Dairy Low-fat or nonfat  cheeses, including ricotta and mozzarella. Skim or 1% milk that is liquid, powdered, or evaporated. Buttermilk that is made with low-fat milk. Nonfat or low-fat yogurt. Fats and oils Non-hydrogenated (trans-free) margarines. Vegetable oils, including soybean, sesame, sunflower, olive, peanut, safflower, corn, canola, and cottonseed. Salad dressings or mayonnaise made with a vegetable oil. Beverages Mineral water. Coffee and tea. Diet carbonated beverages. Sweets and desserts Sherbet, gelatin, and fruit ice. Small amounts of dark chocolate. Limit all sweets and desserts. Seasonings and condiments All seasonings and condiments. The items listed above may not be a complete list of foods and drinks you can eat. Contact a dietitian for more options. What foods should I avoid? Fruits Canned fruit in heavy syrup. Fruit in cream or butter sauce. Fried fruit. Limit coconut. Vegetables Vegetables cooked in cheese, cream, or butter sauce. Fried vegetables. Grains Breads that are made with saturated or trans fats, oils, or whole milk. Croissants. Sweet rolls. Donuts. High-fat crackers, such as cheese crackers. Meats and other proteins Fatty meats, such as hot dogs, ribs, sausage, bacon, rib-eye roast or steak. High-fat deli meats, such as salami and bologna. Caviar. Domestic duck and goose. Organ meats, such as liver. Dairy Cream, sour cream, cream cheese, and creamed cottage cheese. Whole-milk cheeses. Whole or 2% milk that is liquid, evaporated, or condensed. Whole buttermilk. Cream sauce or high-fat cheese sauce. Yogurt that is made from whole milk. Fats and oils Meat fat, or shortening. Cocoa butter, hydrogenated oils, palm oil, coconut oil, palm kernel oil. Solid fats and shortenings, including bacon fat, salt pork, lard, and butter. Nondairy cream substitutes. Salad dressings with cheese or sour cream. Beverages Regular sodas and juice drinks with added sugar. Sweets and desserts Frosting.  Pudding. Cookies. Cakes. Pies. Milk chocolate or white chocolate. Buttered syrups. Full-fat ice cream or ice cream drinks. The items listed above may not be a complete list of foods and drinks to avoid. Contact a dietitian for more information. Summary  Heart-healthy meal planning includes eating less unhealthy fats, eating more healthy fats, and making other changes in your diet.  Eat a balanced diet. This includes fruits and vegetables, low-fat or nonfat dairy, lean protein, nuts and legumes, whole grains, and heart-healthy oils and fats. This information is not intended to replace advice given to you by your health care provider. Make sure you discuss any questions you have with your health care provider. Document Released: 12/11/2011 Document Revised: 08/15/2017 Document Reviewed: 07/19/2017 Elsevier Patient Education  2020 [employer].

## 2019-06-01 ENCOUNTER — Inpatient Hospital Stay (HOSPITAL_COMMUNITY)
Admission: EM | Admit: 2019-06-01 | Discharge: 2019-07-07 | DRG: 682 | Disposition: A | Discharge status: Hospice | Payer: Medicare HMO | Attending: Internal Medicine | Admitting: Internal Medicine

## 2019-06-01 ENCOUNTER — Other Ambulatory Visit: Payer: Self-pay

## 2019-06-01 ENCOUNTER — Telehealth: Payer: Self-pay | Admitting: *Deleted

## 2019-06-01 ENCOUNTER — Encounter (HOSPITAL_COMMUNITY): Payer: Self-pay

## 2019-06-01 DIAGNOSIS — M1990 Unspecified osteoarthritis, unspecified site: Secondary | ICD-10-CM | POA: Diagnosis present

## 2019-06-01 DIAGNOSIS — Z515 Encounter for palliative care: Secondary | ICD-10-CM | POA: Diagnosis not present

## 2019-06-01 DIAGNOSIS — Z807 Family history of other malignant neoplasms of lymphoid, hematopoietic and related tissues: Secondary | ICD-10-CM

## 2019-06-01 DIAGNOSIS — I82401 Acute embolism and thrombosis of unspecified deep veins of right lower extremity: Secondary | ICD-10-CM

## 2019-06-01 DIAGNOSIS — G92 Toxic encephalopathy: Secondary | ICD-10-CM | POA: Diagnosis not present

## 2019-06-01 DIAGNOSIS — Z66 Do not resuscitate: Secondary | ICD-10-CM | POA: Diagnosis not present

## 2019-06-01 DIAGNOSIS — I82411 Acute embolism and thrombosis of right femoral vein: Secondary | ICD-10-CM | POA: Diagnosis not present

## 2019-06-01 DIAGNOSIS — R239 Unspecified skin changes: Secondary | ICD-10-CM | POA: Diagnosis not present

## 2019-06-01 DIAGNOSIS — I10 Essential (primary) hypertension: Secondary | ICD-10-CM | POA: Diagnosis not present

## 2019-06-01 DIAGNOSIS — D61818 Other pancytopenia: Secondary | ICD-10-CM | POA: Diagnosis not present

## 2019-06-01 DIAGNOSIS — J90 Pleural effusion, not elsewhere classified: Secondary | ICD-10-CM | POA: Diagnosis not present

## 2019-06-01 DIAGNOSIS — R0602 Shortness of breath: Secondary | ICD-10-CM | POA: Diagnosis not present

## 2019-06-01 DIAGNOSIS — J9621 Acute and chronic respiratory failure with hypoxia: Secondary | ICD-10-CM | POA: Diagnosis not present

## 2019-06-01 DIAGNOSIS — N179 Acute kidney failure, unspecified: Secondary | ICD-10-CM | POA: Diagnosis present

## 2019-06-01 DIAGNOSIS — B9620 Unspecified Escherichia coli [E. coli] as the cause of diseases classified elsewhere: Secondary | ICD-10-CM | POA: Diagnosis present

## 2019-06-01 DIAGNOSIS — L304 Erythema intertrigo: Secondary | ICD-10-CM | POA: Diagnosis not present

## 2019-06-01 DIAGNOSIS — N189 Chronic kidney disease, unspecified: Secondary | ICD-10-CM | POA: Diagnosis not present

## 2019-06-01 DIAGNOSIS — C7931 Secondary malignant neoplasm of brain: Secondary | ICD-10-CM | POA: Diagnosis present

## 2019-06-01 DIAGNOSIS — C782 Secondary malignant neoplasm of pleura: Secondary | ICD-10-CM | POA: Diagnosis present

## 2019-06-01 DIAGNOSIS — J9622 Acute and chronic respiratory failure with hypercapnia: Secondary | ICD-10-CM | POA: Diagnosis not present

## 2019-06-01 DIAGNOSIS — I739 Peripheral vascular disease, unspecified: Secondary | ICD-10-CM | POA: Diagnosis not present

## 2019-06-01 DIAGNOSIS — Z8249 Family history of ischemic heart disease and other diseases of the circulatory system: Secondary | ICD-10-CM

## 2019-06-01 DIAGNOSIS — J690 Pneumonitis due to inhalation of food and vomit: Secondary | ICD-10-CM | POA: Diagnosis not present

## 2019-06-01 DIAGNOSIS — Z03818 Encounter for observation for suspected exposure to other biological agents ruled out: Secondary | ICD-10-CM | POA: Diagnosis not present

## 2019-06-01 DIAGNOSIS — L8990 Pressure ulcer of unspecified site, unspecified stage: Secondary | ICD-10-CM | POA: Insufficient documentation

## 2019-06-01 DIAGNOSIS — E876 Hypokalemia: Secondary | ICD-10-CM | POA: Diagnosis not present

## 2019-06-01 DIAGNOSIS — R531 Weakness: Secondary | ICD-10-CM | POA: Diagnosis not present

## 2019-06-01 DIAGNOSIS — E039 Hypothyroidism, unspecified: Secondary | ICD-10-CM | POA: Diagnosis present

## 2019-06-01 DIAGNOSIS — C50919 Malignant neoplasm of unspecified site of unspecified female breast: Secondary | ICD-10-CM | POA: Diagnosis not present

## 2019-06-01 DIAGNOSIS — Y92239 Unspecified place in hospital as the place of occurrence of the external cause: Secondary | ICD-10-CM | POA: Diagnosis not present

## 2019-06-01 DIAGNOSIS — C7800 Secondary malignant neoplasm of unspecified lung: Secondary | ICD-10-CM | POA: Diagnosis present

## 2019-06-01 DIAGNOSIS — E872 Acidosis: Secondary | ICD-10-CM | POA: Diagnosis not present

## 2019-06-01 DIAGNOSIS — R339 Retention of urine, unspecified: Secondary | ICD-10-CM | POA: Diagnosis present

## 2019-06-01 DIAGNOSIS — Z801 Family history of malignant neoplasm of trachea, bronchus and lung: Secondary | ICD-10-CM

## 2019-06-01 DIAGNOSIS — M7989 Other specified soft tissue disorders: Secondary | ICD-10-CM | POA: Diagnosis not present

## 2019-06-01 DIAGNOSIS — G936 Cerebral edema: Secondary | ICD-10-CM | POA: Diagnosis present

## 2019-06-01 DIAGNOSIS — I82431 Acute embolism and thrombosis of right popliteal vein: Secondary | ICD-10-CM | POA: Diagnosis not present

## 2019-06-01 DIAGNOSIS — N390 Urinary tract infection, site not specified: Secondary | ICD-10-CM | POA: Diagnosis present

## 2019-06-01 DIAGNOSIS — E870 Hyperosmolality and hypernatremia: Secondary | ICD-10-CM | POA: Diagnosis not present

## 2019-06-01 DIAGNOSIS — Z853 Personal history of malignant neoplasm of breast: Secondary | ICD-10-CM

## 2019-06-01 DIAGNOSIS — E440 Moderate protein-calorie malnutrition: Secondary | ICD-10-CM | POA: Diagnosis present

## 2019-06-01 DIAGNOSIS — R0902 Hypoxemia: Secondary | ICD-10-CM | POA: Diagnosis not present

## 2019-06-01 DIAGNOSIS — A6000 Herpesviral infection of urogenital system, unspecified: Secondary | ICD-10-CM | POA: Diagnosis not present

## 2019-06-01 DIAGNOSIS — D638 Anemia in other chronic diseases classified elsewhere: Secondary | ICD-10-CM | POA: Diagnosis present

## 2019-06-01 DIAGNOSIS — E1122 Type 2 diabetes mellitus with diabetic chronic kidney disease: Secondary | ICD-10-CM | POA: Diagnosis present

## 2019-06-01 DIAGNOSIS — R64 Cachexia: Secondary | ICD-10-CM | POA: Diagnosis present

## 2019-06-01 DIAGNOSIS — L2489 Irritant contact dermatitis due to other agents: Secondary | ICD-10-CM | POA: Diagnosis not present

## 2019-06-01 DIAGNOSIS — L639 Alopecia areata, unspecified: Secondary | ICD-10-CM | POA: Diagnosis present

## 2019-06-01 DIAGNOSIS — I82451 Acute embolism and thrombosis of right peroneal vein: Secondary | ICD-10-CM | POA: Diagnosis present

## 2019-06-01 DIAGNOSIS — R600 Localized edema: Secondary | ICD-10-CM | POA: Diagnosis not present

## 2019-06-01 DIAGNOSIS — Z171 Estrogen receptor negative status [ER-]: Secondary | ICD-10-CM

## 2019-06-01 DIAGNOSIS — Z6831 Body mass index (BMI) 31.0-31.9, adult: Secondary | ICD-10-CM

## 2019-06-01 DIAGNOSIS — Z96652 Presence of left artificial knee joint: Secondary | ICD-10-CM | POA: Diagnosis present

## 2019-06-01 DIAGNOSIS — Z751 Person awaiting admission to adequate facility elsewhere: Secondary | ICD-10-CM

## 2019-06-01 DIAGNOSIS — J189 Pneumonia, unspecified organism: Secondary | ICD-10-CM | POA: Diagnosis not present

## 2019-06-01 DIAGNOSIS — R4182 Altered mental status, unspecified: Secondary | ICD-10-CM | POA: Diagnosis not present

## 2019-06-01 DIAGNOSIS — C7989 Secondary malignant neoplasm of other specified sites: Secondary | ICD-10-CM | POA: Diagnosis present

## 2019-06-01 DIAGNOSIS — J9601 Acute respiratory failure with hypoxia: Secondary | ICD-10-CM

## 2019-06-01 DIAGNOSIS — I130 Hypertensive heart and chronic kidney disease with heart failure and stage 1 through stage 4 chronic kidney disease, or unspecified chronic kidney disease: Secondary | ICD-10-CM | POA: Diagnosis present

## 2019-06-01 DIAGNOSIS — B20 Human immunodeficiency virus [HIV] disease: Secondary | ICD-10-CM | POA: Diagnosis present

## 2019-06-01 DIAGNOSIS — C7951 Secondary malignant neoplasm of bone: Secondary | ICD-10-CM | POA: Diagnosis present

## 2019-06-01 DIAGNOSIS — E2740 Unspecified adrenocortical insufficiency: Secondary | ICD-10-CM | POA: Diagnosis present

## 2019-06-01 DIAGNOSIS — G9341 Metabolic encephalopathy: Secondary | ICD-10-CM | POA: Diagnosis not present

## 2019-06-01 DIAGNOSIS — I50810 Right heart failure, unspecified: Secondary | ICD-10-CM | POA: Diagnosis present

## 2019-06-01 DIAGNOSIS — B009 Herpesviral infection, unspecified: Secondary | ICD-10-CM | POA: Diagnosis present

## 2019-06-01 DIAGNOSIS — Z20822 Contact with and (suspected) exposure to covid-19: Secondary | ICD-10-CM | POA: Diagnosis present

## 2019-06-01 DIAGNOSIS — Z841 Family history of disorders of kidney and ureter: Secondary | ICD-10-CM

## 2019-06-01 DIAGNOSIS — E1151 Type 2 diabetes mellitus with diabetic peripheral angiopathy without gangrene: Secondary | ICD-10-CM | POA: Diagnosis present

## 2019-06-01 DIAGNOSIS — I959 Hypotension, unspecified: Secondary | ICD-10-CM | POA: Diagnosis not present

## 2019-06-01 DIAGNOSIS — E782 Mixed hyperlipidemia: Secondary | ICD-10-CM | POA: Diagnosis present

## 2019-06-01 DIAGNOSIS — L89154 Pressure ulcer of sacral region, stage 4: Secondary | ICD-10-CM | POA: Diagnosis present

## 2019-06-01 DIAGNOSIS — N1831 Chronic kidney disease, stage 3a: Secondary | ICD-10-CM | POA: Diagnosis present

## 2019-06-01 DIAGNOSIS — I82441 Acute embolism and thrombosis of right tibial vein: Secondary | ICD-10-CM | POA: Diagnosis not present

## 2019-06-01 DIAGNOSIS — T361X5A Adverse effect of cephalosporins and other beta-lactam antibiotics, initial encounter: Secondary | ICD-10-CM | POA: Diagnosis present

## 2019-06-01 DIAGNOSIS — Z823 Family history of stroke: Secondary | ICD-10-CM

## 2019-06-01 DIAGNOSIS — R627 Adult failure to thrive: Secondary | ICD-10-CM | POA: Diagnosis present

## 2019-06-01 DIAGNOSIS — C50911 Malignant neoplasm of unspecified site of right female breast: Secondary | ICD-10-CM | POA: Diagnosis present

## 2019-06-01 DIAGNOSIS — Z7401 Bed confinement status: Secondary | ICD-10-CM | POA: Diagnosis not present

## 2019-06-01 DIAGNOSIS — Z833 Family history of diabetes mellitus: Secondary | ICD-10-CM

## 2019-06-01 DIAGNOSIS — M2550 Pain in unspecified joint: Secondary | ICD-10-CM | POA: Diagnosis not present

## 2019-06-01 DIAGNOSIS — I340 Nonrheumatic mitral (valve) insufficiency: Secondary | ICD-10-CM | POA: Diagnosis not present

## 2019-06-01 DIAGNOSIS — I82402 Acute embolism and thrombosis of unspecified deep veins of left lower extremity: Secondary | ICD-10-CM | POA: Diagnosis not present

## 2019-06-01 DIAGNOSIS — I2721 Secondary pulmonary arterial hypertension: Secondary | ICD-10-CM | POA: Diagnosis present

## 2019-06-01 DIAGNOSIS — B372 Candidiasis of skin and nail: Secondary | ICD-10-CM | POA: Diagnosis present

## 2019-06-01 DIAGNOSIS — I361 Nonrheumatic tricuspid (valve) insufficiency: Secondary | ICD-10-CM | POA: Diagnosis not present

## 2019-06-01 DIAGNOSIS — A6009 Herpesviral infection of other urogenital tract: Secondary | ICD-10-CM | POA: Diagnosis not present

## 2019-06-01 DIAGNOSIS — Z825 Family history of asthma and other chronic lower respiratory diseases: Secondary | ICD-10-CM

## 2019-06-01 DIAGNOSIS — R52 Pain, unspecified: Secondary | ICD-10-CM | POA: Diagnosis not present

## 2019-06-01 DIAGNOSIS — Z7189 Other specified counseling: Secondary | ICD-10-CM | POA: Diagnosis not present

## 2019-06-01 DIAGNOSIS — G9340 Encephalopathy, unspecified: Secondary | ICD-10-CM | POA: Diagnosis not present

## 2019-06-01 DIAGNOSIS — R918 Other nonspecific abnormal finding of lung field: Secondary | ICD-10-CM | POA: Diagnosis not present

## 2019-06-01 DIAGNOSIS — J910 Malignant pleural effusion: Secondary | ICD-10-CM

## 2019-06-01 DIAGNOSIS — Z8261 Family history of arthritis: Secondary | ICD-10-CM

## 2019-06-01 LAB — COMPREHENSIVE METABOLIC PANEL
ALT: 31 U/L (ref 0–44)
AST: 18 U/L (ref 15–41)
Albumin: 2.5 g/dL — ABNORMAL LOW (ref 3.5–5.0)
Alkaline Phosphatase: 42 U/L (ref 38–126)
Anion gap: 6 (ref 5–15)
BUN: 39 mg/dL — ABNORMAL HIGH (ref 8–23)
CO2: 20 mmol/L — ABNORMAL LOW (ref 22–32)
Calcium: 8.8 mg/dL — ABNORMAL LOW (ref 8.9–10.3)
Chloride: 113 mmol/L — ABNORMAL HIGH (ref 98–111)
Creatinine, Ser: 1.75 mg/dL — ABNORMAL HIGH (ref 0.44–1.00)
GFR calc Af Amer: 35 mL/min — ABNORMAL LOW (ref >60)
GFR calc non Af Amer: 30 mL/min — ABNORMAL LOW (ref >60)
Glucose, Bld: 129 mg/dL — ABNORMAL HIGH (ref 70–99)
Potassium: 4.8 mmol/L (ref 3.5–5.1)
Sodium: 139 mmol/L (ref 135–145)
Total Bilirubin: 0.2 mg/dL — ABNORMAL LOW (ref 0.3–1.2)
Total Protein: 5.5 g/dL — ABNORMAL LOW (ref 6.5–8.1)

## 2019-06-01 LAB — CBC WITH DIFFERENTIAL/PLATELET
Abs Immature Granulocytes: 0.03 10*3/uL (ref 0.00–0.07)
Basophils Absolute: 0 10*3/uL (ref 0.0–0.1)
Basophils Relative: 0 %
Eosinophils Absolute: 0 10*3/uL (ref 0.0–0.5)
Eosinophils Relative: 0 %
HCT: 26.5 % — ABNORMAL LOW (ref 36.0–46.0)
Hemoglobin: 8.7 g/dL — ABNORMAL LOW (ref 12.0–15.0)
Immature Granulocytes: 1 %
Lymphocytes Relative: 18 %
Lymphs Abs: 0.5 10*3/uL — ABNORMAL LOW (ref 0.7–4.0)
MCH: 31.3 pg (ref 26.0–34.0)
MCHC: 32.8 g/dL (ref 30.0–36.0)
MCV: 95.3 fL (ref 80.0–100.0)
Monocytes Absolute: 0.2 10*3/uL (ref 0.1–1.0)
Monocytes Relative: 7 %
Neutro Abs: 1.8 10*3/uL (ref 1.7–7.7)
Neutrophils Relative %: 74 %
Platelets: 65 10*3/uL — ABNORMAL LOW (ref 150–400)
RBC: 2.78 MIL/uL — ABNORMAL LOW (ref 3.87–5.11)
RDW: 18.1 % — ABNORMAL HIGH (ref 11.5–15.5)
WBC: 2.5 10*3/uL — ABNORMAL LOW (ref 4.0–10.5)
nRBC: 1.2 % — ABNORMAL HIGH (ref 0.0–0.2)

## 2019-06-01 LAB — URINALYSIS, ROUTINE W REFLEX MICROSCOPIC
Bacteria, UA: NONE SEEN
Bilirubin Urine: NEGATIVE
Glucose, UA: NEGATIVE mg/dL
Hgb urine dipstick: NEGATIVE
Ketones, ur: NEGATIVE mg/dL
Leukocytes,Ua: NEGATIVE
Nitrite: NEGATIVE
Protein, ur: 30 mg/dL — AB
Specific Gravity, Urine: 1.023 (ref 1.005–1.030)
pH: 6 (ref 5.0–8.0)

## 2019-06-01 LAB — CREATININE, SERUM
Creatinine, Ser: 1.68 mg/dL — ABNORMAL HIGH (ref 0.44–1.00)
GFR calc Af Amer: 37 mL/min — ABNORMAL LOW (ref >60)
GFR calc non Af Amer: 32 mL/min — ABNORMAL LOW (ref >60)

## 2019-06-01 LAB — LACTIC ACID, PLASMA: Lactic Acid, Venous: 1.4 mmol/L (ref 0.5–1.9)

## 2019-06-01 MED ORDER — NEPRO/CARBSTEADY PO LIQD
237.0000 mL | Freq: Three times a day (TID) | ORAL | Status: DC | PRN
  Filled 2019-06-01: qty 237

## 2019-06-01 MED ORDER — CAMPHOR-MENTHOL 0.5-0.5 % EX LOTN: 1.0000 "application " | TOPICAL_LOTION | Freq: Three times a day (TID) | CUTANEOUS | Status: DC | PRN

## 2019-06-01 MED ORDER — SODIUM CHLORIDE 0.9 % IV BOLUS (SEPSIS)
1000.0000 mL | Freq: Once | INTRAVENOUS | Status: AC
  Administered 2019-06-01: 1000 mL via INTRAVENOUS

## 2019-06-01 MED ORDER — ONDANSETRON HCL 4 MG/2ML IJ SOLN: 4.0000 mg | Freq: Four times a day (QID) | INTRAMUSCULAR | Status: DC | PRN

## 2019-06-01 MED ORDER — SORBITOL 70 % SOLN
30.0000 mL | Status: DC | PRN
  Filled 2019-06-01: qty 30

## 2019-06-01 MED ORDER — ZOLPIDEM TARTRATE 5 MG PO TABS
5.0000 mg | ORAL_TABLET | Freq: Every evening | ORAL | Status: DC | PRN
  Administered 2019-06-11: 5 mg via ORAL
  Filled 2019-06-01: qty 1

## 2019-06-01 MED ORDER — HYDROXYZINE HCL 25 MG PO TABS: 25.0000 mg | ORAL_TABLET | Freq: Three times a day (TID) | ORAL | Status: DC | PRN

## 2019-06-01 MED ORDER — NYSTATIN 100000 UNIT/GM EX POWD
Freq: Once | CUTANEOUS | Status: AC
  Administered 2019-06-01: 19:00:00 via TOPICAL
  Filled 2019-06-01: qty 15

## 2019-06-01 MED ORDER — ACETAMINOPHEN 325 MG PO TABS
650.0000 mg | ORAL_TABLET | Freq: Four times a day (QID) | ORAL | Status: DC | PRN
  Administered 2019-06-02: 650 mg via ORAL
  Filled 2019-06-01: qty 2

## 2019-06-01 MED ORDER — ACETAMINOPHEN 650 MG RE SUPP: 650.0000 mg | Freq: Four times a day (QID) | RECTAL | Status: DC | PRN

## 2019-06-01 MED ORDER — DOCUSATE SODIUM 283 MG RE ENEM
1.0000 | ENEMA | RECTAL | Status: DC | PRN
  Filled 2019-06-01: qty 1

## 2019-06-01 MED ORDER — HEPARIN SODIUM (PORCINE) 5000 UNIT/ML IJ SOLN
5000.0000 [IU] | Freq: Three times a day (TID) | INTRAMUSCULAR | Status: DC
  Administered 2019-06-01: 5000 [IU] via SUBCUTANEOUS
  Filled 2019-06-01: qty 1

## 2019-06-01 MED ORDER — CALCIUM CARBONATE ANTACID 1250 MG/5ML PO SUSP
500.0000 mg | Freq: Four times a day (QID) | ORAL | Status: DC | PRN
  Filled 2019-06-01: qty 5

## 2019-06-01 MED ORDER — ONDANSETRON HCL 4 MG PO TABS: 4.0000 mg | ORAL_TABLET | Freq: Four times a day (QID) | ORAL | Status: DC | PRN

## 2019-06-01 MED FILL — LEVOTHYROXINE SODIUM 200 MC: 200 | 30 days supply | Qty: 30 | Fill #3

## 2019-06-01 MED FILL — PREZCOBIX 800 MG-150 MG TAB: 800-150 | 30 days supply | Qty: 30 | Fill #1

## 2019-06-01 MED FILL — SELZENTRY 150 MG TABS: 150 | 30 days supply | Qty: 60 | Fill #5

## 2019-06-01 NOTE — ED Triage Notes (Signed)
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 Pt was seen recently for same, admitted and d/c'd. Family reports new and worsening skin breakdown. EMS initially called out because PICC line was mistakenly left in prior to DC. On EMS arrival, noted worsening decub ulcers and foul odor.

## 2019-06-01 NOTE — Telephone Encounter (Signed)
Needs TOC follow up, they took her off this in the hospital and we need to get blood work and bp in office before any additional medication can be prescribed

## 2019-06-01 NOTE — Telephone Encounter (Signed)
Patient called and stated that she needs a refill on her Benicar. Stated that you had taken her off of the one with the fluid pill in it. Benicar is not in current medication list. Wants Rx sent to [facility]. Can this be added back and refilled. Please Advise.

## 2019-06-01 NOTE — ED Notes (Signed)
Bladder Scan showed >470ml. MD made aware

## 2019-06-01 NOTE — ED Provider Notes (Signed)
[facility] EMERGENCY DEPARTMENT Provider Note   CSN: [id number] Arrival date & time: 06/01/19  1617     History   Chief Complaint Chief Complaint  Patient presents with   Cellulitis   PICC Line Removal    HPI Kayla Price is a 63 y.o. female.     Patient is a 63 year old female with past medical history of metastatic breast cancer, HIV, hypertension presenting to the emergency department for PICC line removal.  Patient was admitted to the hospital on the second of this month for AKI.  She was treated with fluids and also treated for inguinal yeast infection.  Patient reports that she was feeling improved when she went home last night but noticed that the PICC line was still in place so return to the emergency department to have it removed.  She reports that her wounds are still oozing but they are improved from when she initially came in.  She denies any fever, chills or nausea, vomiting, chest pain, shortness of breath. Patient was also treated at that time for hypotension.  Had blood pressures in the 56D systolic.  Was seen by critical care as well but reportedly was not septic and did not require pressors.  She was given hydrocortisone and fluids and this improved.  Patient was discharged with instruction for using nystatin powder, cefdinir and Diflucan pills.  She was also told to hold any of her blood pressure medications.     Past Medical History:  Diagnosis Date   Alopecia areata 11/28/2009   Bell's palsy    Cancer ([place]) 2005   Breast cancer   chemotherapy and radiation   CKD (chronic kidney disease) stage 3, GFR 30-59 ml/min 06/08/2015   Dry eye syndrome    Family history of lung cancer    Family history of non-Hodgkin's lymphoma    Fasting hyperglycemia    Gestational diabetes    2001   HIP FRACTURE, RIGHT 05/06/2008   History of kidney stones    HIV DISEASE 03/27/2006   HYPERLIPIDEMIA, MIXED 12/15/2007   HYPERTENSION 03/27/2006     HYPOTHYROIDISM, POST-RADIATION 06/28/2008   MENORRHAGIA, POSTMENOPAUSAL 02/03/2009   Osteoarthritis of left knee 06/08/2015   OSTEOARTHROSIS, LOCAL, SCND, UNSPC SITE 04/07/2007   Personal history of chemotherapy 11/2018   PVD 04/07/2007   Tinea capitis    TRIGGER FINGER 05/06/2008   Unspecified vitamin D deficiency 08/06/2007    Patient Active Problem List   Diagnosis Date Noted   AKI (acute kidney injury) ([place]) 05/28/2019   Hyperkalemia 05/28/2019   UTI (urinary tract infection) 05/28/2019   Hypotension 05/28/2019   ARF (acute renal failure) ([place]) 05/28/2019   Brain metastasis ([place]) 04/28/2019   Hypothyroidism 04/28/2019   Anemia of chronic disease 04/28/2019   Encounter for antineoplastic chemotherapy 02/06/2019   Port-A-Cath in place 08/01/2018   Goals of care, counseling/discussion 06/26/2018   Recurrent right pleural effusion 05/30/2018   Malignant pleural effusion 05/19/2018   Hilar adenopathy 05/15/2018   Pleural effusion on right 04/25/2018   Seasonal allergic rhinitis due to pollen 01/16/2018   Aortic atherosclerosis ([place]) 11/11/2017   Morbid obesity with BMI of 40.0-44.9, adult ([place]) 11/11/2017   Genetic testing 06/27/2017   Family history of non-Hodgkin's lymphoma    Family history of lung cancer    Endometrial thickening on ultrasound 01/25/2017   Metastatic breast cancer ([place]) 11/05/2016   Malignant neoplasm of upper-outer quadrant of right breast in female, estrogen receptor negative ([place]) 10/09/2016   Primary localized osteoarthritis  of left knee 02/06/2016   Primary osteoarthritis of left knee 02/05/2016   Osteoarthritis of left knee 06/08/2015   CKD (chronic kidney disease), stage III 06/08/2015   Vitamin D deficiency 09/23/2014   Acute renal insufficiency 04/23/2014   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   Bell's palsy 12/04/2012   Contact dermatitis 08/29/2011   Dry eyes 08/20/2011   Dry mouth 08/20/2011    Neuropathy 04/09/2011   Insomnia 04/09/2011   Obesities, morbid ([place]) 09/27/2010   Endometrial polyp 12/22/2009   ALOPECIA AREATA 11/28/2009   MENORRHAGIA, POSTMENOPAUSAL 02/03/2009   ABNORMAL GLANDULAR PAPANICOLAOU SMEAR OF CERVIX 11/04/2008   ALOPECIA 05/06/2008   Trigger finger, acquired 05/06/2008   HIP FRACTURE, RIGHT 05/06/2008   ARTHROSCOPY, LEFT KNEE, HX OF 02/03/2008   HYPERLIPIDEMIA, MIXED 12/15/2007   HAND PAIN, RIGHT 12/15/2007   Other abnormal glucose 12/15/2007   UNSPECIFIED VITAMIN D DEFICIENCY 08/06/2007   TINEA CAPITIS 08/04/2007   CNTC DERMATITIS&OTH ECZEMA DUE OTH CHEM PRODUCTS 08/04/2007   PVD 04/07/2007   Secondary localized osteoarthrosis 04/07/2007   Human immunodeficiency virus (HIV) disease ([place]) 03/27/2006   HTN (hypertension) 03/27/2006   BREAST CANCER, HX OF 03/27/2006    Past Surgical History:  Procedure Laterality Date   BREAST LUMPECTOMY Right    2005   CHEST TUBE INSERTION Right 05/30/2018   Procedure: INSERTION PLEURAL DRAINAGE CATHETER;  Surgeon: Grace Isaac, MD;  Location[facility];  Service: Thoracic;  Laterality: Right;   COLONOSCOPY     COLONOSCOPY WITH ESOPHAGOGASTRODUODENOSCOPY (EGD)     ENDOBRONCHIAL ULTRASOUND Bilateral 05/15/2018   Procedure: ENDOBRONCHIAL ULTRASOUND;  Surgeon: Collene Gobble, MD;  Location: [facility] ENDOSCOPY;  Service: Cardiopulmonary;  Laterality: Bilateral;   FINE NEEDLE ASPIRATION BIOPSY  05/15/2018   Procedure: FINE NEEDLE ASPIRATION BIOPSY;  Surgeon: Collene Gobble, MD;  Location: [facility];  Service: Cardiopulmonary;;   FLEXIBLE BRONCHOSCOPY  05/15/2018   Procedure: FLEXIBLE BRONCHOSCOPY;  Surgeon: Collene Gobble, MD;  Location: [facility] ENDOSCOPY;  Service: Cardiopulmonary;;   HYSTEROSCOPY  2006   IR IMAGING GUIDED PORT INSERTION  07/14/2018   IR THORACENTESIS ASP PLEURAL SPACE W/IMG GUIDE  04/14/2018   IR THORACENTESIS ASP PLEURAL SPACE W/IMG GUIDE  04/28/2018   KNEE  ARTHROSCOPY Left 2002   LYMPH NODE DISSECTION  2005   MASTECTOMY Right    MASTECTOMY MODIFIED RADICAL Right 11/05/2016   Procedure: RIGHT MASTECTOMY MODIFIED RADICAL;  Surgeon: Fanny Skates, MD;  Location: [facility];  Service: General;  Laterality: Right;   MODIFIED RADICAL MASTECTOMY Right 11/05/2016   placement   of port-a-cath     PLEURAL BIOPSY Right 05/30/2018   Procedure: PLEURAL BIOPSY;  Surgeon: Grace Isaac, MD;  Location: [facility];  Service: Thoracic;  Laterality: Right;   PLEURAL EFFUSION DRAINAGE Right 05/30/2018   Procedure: DRAINAGE OF PLEURAL EFFUSION;  Surgeon: Grace Isaac, MD;  Location: [facility];  Service: Thoracic;  Laterality: Right;   PORT-A-CATH REMOVAL  2006   insertion 2005   PORT-A-CATH REMOVAL N/A 03/29/2017   Procedure: REMOVAL PORT-A-CATH;  Surgeon: Fanny Skates, MD;  Location: [facility];  Service: General;  Laterality: N/A;   PORTACATH PLACEMENT Right 11/05/2016   Procedure: INSERTION PORT-A-CATH WITH ULTRA SOUND;  Surgeon: Fanny Skates, MD;  Location: [facility];  Service: General;  Laterality: Right;   removal of port a cath  12/2014   TALC PLEURODESIS Right 05/30/2018   Procedure: Pietro Cassis;  Surgeon: Grace Isaac, MD;  Location: [facility];  Service: Thoracic;  Laterality: Right;   THYROID  SURGERY  2009   Ablation    TOTAL KNEE ARTHROPLASTY Left 02/06/2016   Procedure: TOTAL KNEE ARTHROPLASTY;  Surgeon: Frederik Pear, MD;  Location: [facility];  Service: Orthopedics;  Laterality: Left;   TUBAL LIGATION     VIDEO ASSISTED THORACOSCOPY Right 05/30/2018   Procedure: VIDEO ASSISTED THORACOSCOPY;  Surgeon: Grace Isaac, MD;  Location: [facility] OR;  Service: Thoracic;  Laterality: Right;     OB History    Gravida  4   Para  2   Term  2   Preterm      AB  2   Living  2     SAB      TAB  2   Ectopic      Multiple      Live Births               Home Medications    Prior to Admission medications   Medication Sig Start  Date End Date Taking? Authorizing Provider  acetaminophen (TYLENOL) 500 MG tablet Take 500 mg by mouth every 6 (six) hours as needed (inflammation).    [provider]  cefdinir (OMNICEF) 300 MG capsule Take 1 capsule (300 mg total) by mouth 2 (two) times daily for 3 days. 05/31/19 06/03/19  Donne Hazel, MD  dexamethasone (DECADRON) 2 MG tablet Taper dose: 24m po daily x 3 days, then 152mpo daily x 3 days, then stop. Zero refills 05/31/19   ChDonne HazelMD  ELDERBERRY PO Take 2 tablets by mouth daily.    [provider]  feeding supplement, ENSURE ENLIVE, (ENSURE ENLIVE) LIQD Take 237 mLs by mouth 3 (three) times daily between meals. Patient taking differently: Take 237 mLs by mouth every other day.  05/01/19   KaGuilford ShiMD  fluconazole (DIFLUCAN) 200 MG tablet Take 2 tablets (400 mg total) by mouth daily for 2 days. 06/01/19 06/03/19  ChDonne HazelMD  HYDROcodone-acetaminophen (NORCO/VICODIN) 5-325 MG tablet TAKE ONE TABLET BY MOUTH 4 TIMES A DAY AS INSTRUCTED Patient taking differently: Take 1 tablet by mouth 4 (four) times daily. AS INSTRUCTED 05/01/19   Magrinat, GuVirgie DadMD  JULUCA 50-25 MG TABS TAKE 1 TABLET BY MOUTH DAILY WITH BREAKFAST. TAKE WITH THE PREZCOBIX. Patient taking differently: Take 1 tablet by mouth at bedtime. Take with Prezcobix 04/16/19   VaTommy MedalCoLavell IslamMD  levothyroxine (SYNTHROID) 200 MCG tablet Take 1 tablet (200 mcg total) by mouth daily before breakfast. 02/18/19   ElRenato ShinMD  lidocaine-prilocaine (EMLA) cream APPLY 1 APPLICATION TOPICALLY AS NEEDED. APPLY TO PORT SITE 1 HOUR PRIOR TO ACCESS Patient taking differently: Apply 1 application topically as needed (port). Apply to port site 1 hour prior to access 04/24/19   Magrinat, GuVirgie DadMD  Loratadine 10 MG CAPS Take 1 capsule by mouth as needed.     [provider]  Menthol, Topical Analgesic, (BENGAY EX) Apply 1 application topically daily as needed (muscle pain).     [provider]  methocarbamol (ROBAXIN) 500 MG tablet Take 1 tablet (500 mg total) by mouth every 8 (eight) hours as needed for muscle spasms. 12/08/18   Magrinat, GuVirgie DadMD  Multiple Vitamins-Minerals (MULTIVITAMIN ADULT PO) Take 1 tablet by mouth daily.    [provider]  nystatin (MYCOSTATIN/NYSTOP) powder Apply topically 3 (three) times daily. To affected areas 05/31/19   ChDonne HazelMD  PREZCOBIX 800-150 MG tablet TAKE 1 TABLET BY MOUTH DAILY. SWALLOW WHOLE. DO  NOT CRUSH, BREAK OR CHEW TABLETS. TAKE WITH FOOD. Patient taking differently: Take 1 tablet by mouth at bedtime.  04/16/19   Truman Hayward, MD  Propylene Glycol (SYSTANE BALANCE OP) Place 1 drop into both eyes 4 (four) times daily as needed.    [provider]  SELZENTRY 150 MG tablet TAKE ONE TABLET BY MOUTH TWICE DAILY Patient taking differently: Take 150 mg by mouth 2 (two) times daily.  01/02/19   Truman Hayward, MD    Family History Family History  Problem Relation Age of Onset   Heart attack Brother        Massive MI in 49s   Stroke Brother        CAD   Kidney disease Mother    Stroke Mother    Diabetes Mother    Liver disease Sister    COPD Sister        had I-131 rx of hyperthyroidism   Diabetes Sister    Stroke Sister    Lung cancer Paternal Uncle        hx smoking   Cancer Cousin    Non-Hodgkin's lymphoma Cousin 25       cancer x3, in prostate and lung- unsure if met/spread or if primaries   Heart failure Father    Heart disease Father    Arthritis Father    Sarcoidosis Sister     Social History Social History   Tobacco Use   Smoking status: Never Smoker   Smokeless tobacco: Never Used  Substance Use Topics   Alcohol use: No   Drug use: No     Allergies   Lisinopril, Pepcid [famotidine], Prilosec [omeprazole], and Tums [calcium carbonate antacid]   Review of Systems Review of Systems  Constitutional: Negative for chills,  fatigue and fever.  HENT: Negative for congestion and rhinorrhea.   Respiratory: Negative for cough and shortness of breath.   Cardiovascular: Negative for chest pain.  Gastrointestinal: Negative for abdominal pain, diarrhea, nausea and vomiting.  Genitourinary: Negative for dysuria.  Musculoskeletal: Negative for back pain and gait problem.  Skin: Positive for wound. Negative for color change, pallor and rash.  Allergic/Immunologic: Positive for immunocompromised state.  Neurological: Negative for dizziness, light-headedness and headaches.     Physical Exam Updated Vital Signs BP (!) 88/64 (BP Location: Left Wrist)    Pulse 84    Temp 98.1 F (36.7 C) (Oral)    Resp 16    Ht _0  (1.702 m)    Wt 83 kg    LMP 11/06/2003    SpO2 100%    BMI 28.66 kg/m   Physical Exam Vitals signs and nursing note reviewed.  Constitutional:      General: She is not in acute distress.    Appearance: She is not ill-appearing, toxic-appearing or diaphoretic.     Comments: No acute distress, appears older than stated age.  HENT:     Head: Normocephalic.     Mouth/Throat:     Mouth: Mucous membranes are moist.  Eyes:     Conjunctiva/sclera: Conjunctivae normal.  Cardiovascular:     Rate and Rhythm: Normal rate and regular rhythm.  Pulmonary:     Effort: Pulmonary effort is normal.     Breath sounds: Normal breath sounds.  Chest:     Comments: Port in place in the right upper chest.  There is a needle in the port.  There is no surrounding tenderness, erythema, edema, drainage.  Does not appear infected.  Abdominal:     General: Abdomen is flat.  Skin:    General: Skin is warm and dry.     Capillary Refill: Capillary refill takes less than 2 seconds.     Comments: Patient has skin breakdown in the inguinal folds bilaterally.  There is some oozing from the wound there is no surrounding erythema or palpable abscess.  Neurological:     General: No focal deficit present.     Mental Status: She is  alert and oriented to person, place, and time.  Psychiatric:        Mood and Affect: Mood normal.      ED Treatments / Results  Labs (all labs ordered are listed, but only abnormal results are displayed) Labs Reviewed  CULTURE, BLOOD (ROUTINE X 2)  CULTURE, BLOOD (ROUTINE X 2)  COMPREHENSIVE METABOLIC PANEL  LACTIC ACID, PLASMA  LACTIC ACID, PLASMA  CBC WITH DIFFERENTIAL/PLATELET  PROTIME-INR  URINALYSIS, ROUTINE W REFLEX MICROSCOPIC    EKG None  Radiology No results found.  Procedures Procedures (including critical care time)  Medications Ordered in ED Medications  sodium chloride 0.9 % bolus 1,000 mL (has no administration in time range)     Initial Impression / Assessment and Plan / ED Course  I have reviewed the triage vital signs and the nursing notes.  Pertinent labs & imaging results that were available during my care of the patient were reviewed by me and considered in my medical decision making (see chart for details).  Clinical Course as of Jun 01 2235  Mon May 31, 3709  [id number] 63 year old female who was recently admitted to the hospital and discharged last night presenting today due to her "PICC line being left in place last night".  Also reports that she was promised to have home health assist her at home but they never showed up today.  Otherwise, patient reports that her wounds are improved but still oozing.  Denies any fever or new symptoms.  Patient was initially told to hold her blood pressure medication due to hypotension in the hospital.  She reports that she actually took her Benicar today around lunch time which may explain her hypotension currently.  She is asymptomatic in regards to her hypotension today.  Discussed case with Dr. Verner Chol.  Will obtain labs.  We will also consult with case management.  In regards to "picc line" I appears a needle was left in the patient's port on the R chest.    [KM]  2202 Patient does have an AKI today.  Discussed  with patient's daughter.  Mariann Laster from case management also involved.  Advised that we could get someone with home health to be out may be tomorrow but definitely Wednesday.  Daughter is concerned that she cannot care for the mother at home as far as changing her positions, keeping her clean and dry in order for her wounds to heal.  Discussed with her admitting and following the path of skilled nursing facility on discharge.  Daughter is in agreement with this plan and would like the patient to be admitted and to assess the possibility of skilled nursing facility.   [KM]  2210 Patient also had bladder scan at this time due to being unable to void. Patient with 400cc urine in bladder on scan. Will place foley for urinary retention and also to help with her occasional incontinence to keep her groin wounds dry.    [KM]    Clinical Course User Index [KM] Alveria Apley, PA-C  The patient appears reasonably stabilized for admission considering the current resources, flow, and capabilities available in the ED at this time, and I doubt any other [facility] requiring further screening and/or treatment in the ED prior to admission.   Final Clinical Impressions(s) / ED Diagnoses   Final diagnoses:  None    ED Discharge Orders    None       Kristine Royal 06/01/19 2236    Sherwood Gambler, MD 06/01/19 2250

## 2019-06-01 NOTE — Telephone Encounter (Signed)
Transition Care Management Follow-up Telephone Call  Date of discharge and from where: 05/31/2019 [facility]  How have you been since you were released from the hospital? Better  Any questions or concerns? Yes  Wants refill on her Benicar  Items Reviewed:  Did the pt receive and understand the discharge instructions provided? Yes   Medications obtained and verified? Yes   Any new allergies since your discharge? No   Dietary orders reviewed? Yes  Do you have support at home? Yes   Other (ie: DME, Home Health, etc) Home Health  Functional Questionnaire: (I = Independent and D = Dependent) ADL's: I  Bathing/Dressing- I   Meal Prep- I  Eating- I  Maintaining continence- I  Transferring/Ambulation- I  Managing Meds- I   Follow up appointments reviewed:    PCP Hospital f/u appt confirmed? Yes  Scheduled to see Dinah on 06/02/19 @ 10:30.  [facility] f/u appt confirmed? No    Are transportation arrangements needed? No   If their condition worsens, is the pt aware to call  their PCP or go to the ED? Yes  Was the patient provided with contact information for the PCP's office or ED? Yes  Was the pt encouraged to call back with questions or concerns? Yes

## 2019-06-01 NOTE — Care Management (Addendum)
ED CM received CM consult patient was discharged with [facility] services yesterday from [facility]. Patient and  Daughter returned to the ED

## 2019-06-01 NOTE — H&P (Signed)
History and Physical   Kayla Price TIR:[id number] DOB: 07/29/1955 DOA: 06/01/2019  Referring MD/NP/PA: Dr. Regenia Skeeter  PCP: Lauree Chandler, NP   Outpatient Specialists: Dr. Jana Hakim  Patient coming from: Home  Chief Complaint: Weakness and worsening ulcer  HPI: Kayla Price is a 63 y.o. female with medical history significant of metastatic breast cancer on chemotherapy, HIV disease, hyperlipidemia, chronic kidney disease stage II, hypertension, anemia of chronic disease, protein calorie malnutrition who presents to the ER after discharge from the hospital only yesterday.  Patient was in the hospital for 4 days with acute kidney injury as well as stage IV decubitus ulcer of the sacrum.  She was getting wound care in the hospital.  Daughter brought patient home yesterday but apparently she got worse.  The wound was draining more.  She did not know how to dress the wound.  Patient overall became weak and not eating as much.  Home health was set up and came out indicating they cannot handle the wound.  Daughter is unable to take care of patient at home as she brought her in.  She wants patient to get round-the-clock help which is not available at home.  Patient was noted to have a relapse into acute kidney injury.  She is also retaining urine.  Patient is being readmitted to the hospital therefore with possibility of placement in rehab facility.  She denied any fever or chills no nausea vomiting or diarrhea.  Patient is hypotensive on arrival.  She was instructed to hold off on her Benicar but apparently took it at home..  ED Course: Temperature 98.1 blood pressure 88/64 with pulse 84 respiratory rate of 16 oxygen sats 96% on room air.  Sodium 139 potassium 4.8 chloride 113 CO2 20 BUN 39 creatinine 1.75 and glucose 129.  Baseline creatinine is 1.15.  She has a gap of 6.  Albumin is 2.5.  White count 2.5 hemoglobin 8.7 platelets 65.  Patient will be readmitted to the hospital for treatment and  possible placement  Review of Systems: As per HPI otherwise 10 point review of systems negative.    Past Medical History:  Diagnosis Date  . Alopecia areata 11/28/2009  . Bell's palsy   . Cancer [facility]) 2005   Breast cancer   chemotherapy and radiation  . CKD (chronic kidney disease) stage 3, GFR 30-59 ml/min 06/08/2015  . Dry eye syndrome   . Family history of lung cancer   . Family history of non-Hodgkin's lymphoma   . Fasting hyperglycemia   . Gestational diabetes    2001  . HIP FRACTURE, RIGHT 05/06/2008  . History of kidney stones   . HIV DISEASE 03/27/2006  . HYPERLIPIDEMIA, MIXED 12/15/2007  . HYPERTENSION 03/27/2006  . 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 MENORRHAGIA, POSTMENOPAUSAL 02/03/2009  . Osteoarthritis of left knee 06/08/2015  . OSTEOARTHROSIS, LOCAL, SCND, UNSPC SITE 04/07/2007  . Personal history of chemotherapy 11/2018  . PVD 04/07/2007  . Tinea capitis   . TRIGGER FINGER 05/06/2008  . Unspecified vitamin D deficiency 08/06/2007    Past Surgical History:  Procedure Laterality Date  . BREAST LUMPECTOMY Right    2005  . CHEST TUBE INSERTION Right 05/30/2018   Procedure: INSERTION PLEURAL DRAINAGE CATHETER;  Surgeon: Grace Isaac, MD;  Location: [facility];  Service: Thoracic;  Laterality: Right;  . COLONOSCOPY    . COLONOSCOPY WITH ESOPHAGOGASTRODUODENOSCOPY (EGD)    . ENDOBRONCHIAL ULTRASOUND Bilateral 05/15/2018   Procedure: ENDOBRONCHIAL ULTRASOUND;  Surgeon: Collene Gobble, MD;  Location[facility];  Service: Cardiopulmonary;  Laterality: Bilateral;  . FINE NEEDLE ASPIRATION BIOPSY  05/15/2018   Procedure: FINE NEEDLE ASPIRATION BIOPSY;  Surgeon: Collene Gobble, MD;  Location: [facility];  Service: Cardiopulmonary;;  . FLEXIBLE BRONCHOSCOPY  05/15/2018   Procedure: FLEXIBLE BRONCHOSCOPY;  Surgeon: Collene Gobble, MD;  Location: [facility];  Service: Cardiopulmonary;;  . HYSTEROSCOPY  2006  . IR IMAGING GUIDED PORT INSERTION  07/14/2018  .  IR THORACENTESIS ASP PLEURAL SPACE W/IMG GUIDE  04/14/2018  . IR THORACENTESIS ASP PLEURAL SPACE W/IMG GUIDE  04/28/2018  . KNEE ARTHROSCOPY Left 2002  . LYMPH NODE DISSECTION  2005  . MASTECTOMY Right   . MASTECTOMY MODIFIED RADICAL Right 11/05/2016   Procedure: RIGHT MASTECTOMY MODIFIED RADICAL;  Surgeon: Fanny Skates, MD;  Location: [facility];  Service: General;  Laterality: Right;  . MODIFIED RADICAL MASTECTOMY Right 11/05/2016  . placement   of port-a-cath    . PLEURAL BIOPSY Right 05/30/2018   Procedure: PLEURAL BIOPSY;  Surgeon: Grace Isaac, MD;  Location: [facility];  Service: Thoracic;  Laterality: Right;  . PLEURAL EFFUSION DRAINAGE Right 05/30/2018   Procedure: DRAINAGE OF PLEURAL EFFUSION;  Surgeon: Grace Isaac, MD;  Location: [facility];  Service: Thoracic;  Laterality: Right;  . PORT-A-CATH REMOVAL  2006   insertion 2005  . PORT-A-CATH REMOVAL N/A 03/29/2017   Procedure: REMOVAL PORT-A-CATH;  Surgeon: Fanny Skates, MD;  Location: [facility];  Service: General;  Laterality: N/A;  . PORTACATH PLACEMENT Right 11/05/2016   Procedure: INSERTION PORT-A-CATH WITH ULTRA SOUND;  Surgeon: Fanny Skates, MD;  Location: [facility];  Service: General;  Laterality: Right;  . removal of port a cath  12/2014  . TALC PLEURODESIS Right 05/30/2018   Procedure: Pietro Cassis;  Surgeon: Grace Isaac, MD;  Location: [place];  Service: Thoracic;  Laterality: Right;  . THYROID SURGERY  2009   Ablation   . TOTAL KNEE ARTHROPLASTY Left 02/06/2016   Procedure: TOTAL KNEE ARTHROPLASTY;  Surgeon: Frederik Pear, MD;  Location: [place];  Service: Orthopedics;  Laterality: Left;  . TUBAL LIGATION    . VIDEO ASSISTED THORACOSCOPY Right 05/30/2018   Procedure: VIDEO ASSISTED THORACOSCOPY;  Surgeon: Grace Isaac, MD;  Location: [place];  Service: Thoracic;  Laterality: Right;     reports that she has never smoked. She has never used smokeless tobacco. She reports that she does not drink alcohol or use drugs.   Allergies  Allergen Reactions  . Lisinopril Anaphylaxis and Swelling    Swelling of tongue and mouth 11/05/16- tolerates Olmesartan  . Pepcid [Famotidine] Other (See Comments)    PPI H2, BLOCKERS LOWER GASTRIC PH WHICH WOULD LEAD TO SUBTHERAPEUTIC RILPIVIRINE LEVELS AND POTENTIAL VIROLOGICAL FAILURE WITH RESISTANCE  . Prilosec [Omeprazole] Other (See Comments)    PPI H2, BLOCKERS LOWER GASTRIC PH WHICH WOULD LEAD TO SUBTHERAPEUTIC RILPIVIRINE LEVELS AND POTENTIAL VIROLOGICAL FAILURE WITH RESISTANCE  . Tums [Calcium Carbonate Antacid] Other (See Comments)    TUMS ANTACIDS CAN LOWER GASTRIC PH WHICH COULD  LEAD TO SUBTHERAPEUTIC RILPIVIRINE LEVELS AND POTENTIAL VIROLOGICAL FAILURE WITH RESISTANCE TUMS CAN BE GIVEN BUT NEED CONSULT WITH [facility] RE TIMING. I PREFER HER TO AVOID ALL TOGETHER    Family History  Problem Relation Age of Onset  . Heart attack Brother        Massive MI in 21s  . Stroke Brother        CAD  . Kidney disease Mother   . Stroke Mother   .  Diabetes Mother   . Liver disease Sister   . COPD Sister        had I-131 rx of hyperthyroidism  . Diabetes Sister   . Stroke Sister   . Lung cancer Paternal Uncle        hx smoking  . Cancer Cousin   . Non-Hodgkin's lymphoma Cousin 25       cancer x3, in prostate and lung- unsure if met/spread or if primaries  . Heart failure Father   . Heart disease Father   . Arthritis Father   . Sarcoidosis Sister      Prior to Admission medications   Medication Sig Start Date End Date Taking? Authorizing Provider  acetaminophen (TYLENOL) 500 MG tablet Take 500 mg by mouth every 6 (six) hours as needed (inflammation).    [provider]  cefdinir (OMNICEF) 300 MG capsule Take 1 capsule (300 mg total) by mouth 2 (two) times daily for 3 days. 05/31/19 06/03/19  Donne Hazel, MD  dexamethasone (DECADRON) 2 MG tablet Taper dose: 37m po daily x 3 days, then 166mpo daily x 3 days, then stop. Zero refills 05/31/19   ChDonne HazelMD  ELDERBERRY PO Take 2 tablets by mouth daily.    [provider]  feeding supplement, ENSURE ENLIVE, (ENSURE ENLIVE) LIQD Take 237 mLs by mouth 3 (three) times daily between meals. Patient taking differently: Take 237 mLs by mouth every other day.  05/01/19   KaGuilford ShiMD  fluconazole (DIFLUCAN) 200 MG tablet Take 2 tablets (400 mg total) by mouth daily for 2 days. 06/01/19 06/03/19  ChDonne HazelMD  HYDROcodone-acetaminophen (NORCO/VICODIN) 5-325 MG tablet TAKE ONE TABLET BY MOUTH 4 TIMES A DAY AS INSTRUCTED Patient taking differently: Take 1 tablet by mouth 4 (four) times daily. AS INSTRUCTED 05/01/19   Magrinat, GuVirgie DadMD  JULUCA 50-25 MG TABS TAKE 1 TABLET BY MOUTH DAILY WITH BREAKFAST. TAKE WITH THE PREZCOBIX. Patient taking differently: Take 1 tablet by mouth at bedtime. Take with Prezcobix 04/16/19   VaTommy MedalCoLavell IslamMD  levothyroxine (SYNTHROID) 200 MCG tablet Take 1 tablet (200 mcg total) by mouth daily before breakfast. 02/18/19   ElRenato ShinMD  lidocaine-prilocaine (EMLA) cream APPLY 1 APPLICATION TOPICALLY AS NEEDED. APPLY TO PORT SITE 1 HOUR PRIOR TO ACCESS Patient taking differently: Apply 1 application topically as needed (port). Apply to port site 1 hour prior to access 04/24/19   Magrinat, GuVirgie DadMD  Loratadine 10 MG CAPS Take 1 capsule by mouth as needed (Allergies).     [provider]  Menthol, Topical Analgesic, (BENGAY EX) Apply 1 application topically daily as needed (muscle pain).    [provider]  methocarbamol (ROBAXIN) 500 MG tablet Take 1 tablet (500 mg total) by mouth every 8 (eight) hours as needed for muscle spasms. 12/08/18   Magrinat, GuVirgie DadMD  Multiple Vitamins-Minerals (MULTIVITAMIN ADULT PO) Take 1 tablet by mouth daily.    [provider]  nystatin (MYCOSTATIN/NYSTOP) powder Apply topically 3 (three) times daily. To affected areas Patient taking differently: Apply 1 g topically 3  (three) times daily.  05/31/19   ChDonne HazelMD  PREZCOBIX 800-150 MG tablet TAKE 1 TABLET BY MOUTH DAILY. SWALLOW WHOLE. DO NOT CRUSH, BREAK OR CHEW TABLETS. TAKE WITH FOOD. Patient taking differently: Take 1 tablet by mouth at bedtime.  04/16/19   VaTruman HaywardMD  Propylene Glycol (SYSTANE BALANCE OP) Place 1 drop into  both eyes 4 (four) times daily as needed (relieve burning, irritation, and discomfort caused by dry eyes).     [provider]  SELZENTRY 150 MG tablet TAKE ONE TABLET BY MOUTH TWICE DAILY Patient taking differently: Take 150 mg by mouth 2 (two) times daily.  01/02/19   Truman Hayward, MD    Physical Exam: Vitals:   06/01/19 2145 06/01/19 2200 06/01/19 2230 06/01/19 2245  BP: 101/72 116/73 111/79 104/73  Pulse: 75 81 77 72  Resp:      Temp:      TempSrc:      SpO2: 100% 100% 100% 100%  Weight:      Height:          Constitutional: Chronically ill looking, emaciated Vitals:   06/01/19 2145 06/01/19 2200 06/01/19 2230 06/01/19 2245  BP: 101/72 116/73 111/79 104/73  Pulse: 75 81 77 72  Resp:      Temp:      TempSrc:      SpO2: 100% 100% 100% 100%  Weight:      Height:       Eyes: PERRL, lids and conjunctivae normal, proptosis ENMT: Mucous membranes are dry. Posterior pharynx clear of any exudate or lesions.Normal dentition.  Neck: normal, supple, no masses, no thyromegaly Respiratory: clear to auscultation bilaterally, no wheezing, no crackles. Normal respiratory effort. No accessory muscle use.  Cardiovascular: Regular rate and rhythm, no murmurs / rubs / gallops. No extremity edema. 2+ pedal pulses. No carotid bruits.  Abdomen: no tenderness, no masses palpated. No hepatosplenomegaly. Bowel sounds positive.  Musculoskeletal: no clubbing / cyanosis. No joint deformity upper and lower extremities. Good ROM, no contractures. Normal muscle tone.  Skin: Dry, course 16 no rashes, lesions, large sacral decubitus ulcer stage IV with  drainage no induration Neurologic: CN 2-12 grossly intact. Sensation intact, DTR normal. Strength 5/5 in all 4.  Psychiatric: Normal judgment and insight. Alert and oriented x 3. Normal mood.     Labs on Admission: I have personally reviewed following labs and imaging studies  CBC: Recent Labs  Lab 05/27/19 2327 05/29/19 0551 05/30/19 0515 05/31/19 0500 06/01/19 1825  WBC 6.4 2.7* 1.8* 1.8* 2.5*  NEUTROABS 5.9  --   --   --  1.8  HGB 11.2* 8.8* 8.9* 8.7* 8.7*  HCT 34.4* 27.2* 27.5* 26.5* 26.5*  MCV 95.6 96.8 96.5 95.3 95.3  PLT 54* 40* 49* 54* 65*   Basic Metabolic Panel: Recent Labs  Lab 05/28/19 1822 05/29/19 0551 05/30/19 0515 05/31/19 0500 06/01/19 1825  NA 143 144 147* 142 139  K 4.2 3.9 4.1 4.2 4.8  CL 118* 119* 120* 114* 113*  CO2 19* 18* 20* 21* 20*  GLUCOSE 107* 104* 118* 95 129*  BUN 96* 72* 50* 36* 39*  CREATININE 1.88* 1.39* 1.15* 1.19* 1.75*  CALCIUM 8.5* 8.5* 8.8* 8.6* 8.8*   GFR: Estimated Creatinine Clearance: 36.5 mL/min (A) (by C-G formula based on SCr of 1.75 mg/dL (H)). Liver Function Tests: Recent Labs  Lab 05/27/19 2327 05/30/19 0515 05/31/19 0500 06/01/19 1825  AST _0 ALT 46* 37 36 31  ALKPHOS 48 52 45 42  BILITOT 1.0 0.6 0.5 0.2*  PROT 6.2* 5.2* 5.2* 5.5*  ALBUMIN 3.3* 2.4* 2.3* 2.5*   No results for input(s): LIPASE, AMYLASE in the last 168 hours. No results for input(s): AMMONIA in the last 168 hours. Coagulation Profile: No results for input(s): INR, PROTIME in the last 168 hours. Cardiac Enzymes: No  results for input(s): CKTOTAL, CKMB, CKMBINDEX, TROPONINI in the last 168 hours. BNP (last 3 results) No results for input(s): PROBNP in the last 8760 hours. HbA1C: No results for input(s): HGBA1C in the last 72 hours. CBG: No results for input(s): GLUCAP in the last 168 hours. Lipid Profile: No results for input(s): CHOL, HDL, LDLCALC, TRIG, CHOLHDL, LDLDIRECT in the last 72 hours. Thyroid Function Tests: Recent  Labs    05/30/19 1315  FREET4 0.65   Anemia Panel: No results for input(s): VITAMINB12, FOLATE, FERRITIN, TIBC, IRON, RETICCTPCT in the last 72 hours. Urine analysis:    Component Value Date/Time   COLORURINE YELLOW 05/28/2019 0025   APPEARANCEUR HAZY (A) 05/28/2019 0025   LABSPEC 1.014 05/28/2019 0025   PHURINE 5.0 05/28/2019 0025   GLUCOSEU NEGATIVE 05/28/2019 0025   GLUCOSEU NEG mg/dL 07/23/2006 2113   HGBUR MODERATE (A) 05/28/2019 0025   BILIRUBINUR NEGATIVE 05/28/2019 0025   KETONESUR NEGATIVE 05/28/2019 0025   PROTEINUR NEGATIVE 05/28/2019 0025   UROBILINOGEN 1 07/23/2006 2113   NITRITE NEGATIVE 05/28/2019 0025   LEUKOCYTESUR MODERATE (A) 05/28/2019 0025   Sepsis Labs: _0 (procalcitonin:4,lacticidven:4) ) Recent Results (from the past 240 hour(s))  SARS CORONAVIRUS 2 (TAT 6-24 HRS) Nasopharyngeal Nasopharyngeal Swab     Status: None   Collection Time: 05/28/19  5:07 AM   Specimen: Nasopharyngeal Swab  Result Value Ref Range Status   SARS Coronavirus 2 NEGATIVE NEGATIVE Final    Comment: (NOTE) SARS-CoV-2 target nucleic acids are NOT DETECTED. The SARS-CoV-2 RNA is generally detectable in upper and lower respiratory specimens during the acute phase of infection. Negative results do not preclude SARS-CoV-2 infection, do not rule out co-infections with other pathogens, and should not be used as the sole basis for treatment or other patient management decisions. Negative results must be combined with clinical observations, patient history, and epidemiological information. The expected result is Negative. Fact Sheet for Patients: SugarRoll.be Fact Sheet for Healthcare Providers: https://www.woods-mathews.com/ This test is not yet approved or cleared by the [place] FDA and  has been authorized for detection and/or diagnosis of SARS-CoV-2 by FDA under an Emergency Use Authorization (EUA). This EUA will remain  in  effect (meaning this test can be used) for the duration of the COVID-19 declaration under Section 56 4(b)(1) of the Act, 21 U.S.C. section 360bbb-3(b)(1), unless the authorization is terminated or revoked sooner. Performed at [facility], [place] [address], [place], [place] [address]   Culture, blood (routine x 2)     Status: None (Preliminary result)   Collection Time: 05/28/19  6:24 AM   Specimen: BLOOD LEFT WRIST  Result Value Ref Range Status   Specimen Description BLOOD LEFT WRIST  Final   Special Requests   Final    BOTTLES DRAWN AEROBIC AND ANAEROBIC Blood Culture results may not be optimal due to an inadequate volume of blood received in culture bottles   Culture   Final    NO GROWTH 4 DAYS Performed at [facility], [place] [address] [place], [place] [address]    Report Status PENDING  Incomplete  Culture, blood (routine x 2)     Status: None (Preliminary result)   Collection Time: 05/28/19  6:40 AM   Specimen: BLOOD  Result Value Ref Range Status   Specimen Description BLOOD LEFT ANTECUBITAL  Final   Special Requests   Final    BOTTLES DRAWN AEROBIC AND ANAEROBIC Blood Culture adequate volume   Culture   Final    NO  GROWTH 4 DAYS Performed at [facility], [place] [address] [place], [place] [address]    Report Status PENDING  Incomplete  Urine Culture     Status: Abnormal   Collection Time: 05/28/19 11:17 AM   Specimen: Urine, Random  Result Value Ref Range Status   Specimen Description URINE, RANDOM  Final   Special Requests   Final    NONE Performed at [facility], [place] [address], [place], [place] [address]    Culture 30,000 COLONIES/mL ESCHERICHIA COLI (A)  Final   Report Status 05/30/2019 FINAL  Final   Organism ID, Bacteria ESCHERICHIA COLI (A)  Final      Susceptibility   Escherichia coli - MIC*    AMPICILLIN <=2 SENSITIVE Sensitive     CEFAZOLIN <=4 SENSITIVE Sensitive     CEFTRIAXONE <=1 SENSITIVE Sensitive     CIPROFLOXACIN <=0.25  SENSITIVE Sensitive     GENTAMICIN <=1 SENSITIVE Sensitive     IMIPENEM <=0.25 SENSITIVE Sensitive     NITROFURANTOIN <=16 SENSITIVE Sensitive     TRIMETH/SULFA <=20 SENSITIVE Sensitive     AMPICILLIN/SULBACTAM <=2 SENSITIVE Sensitive     PIP/TAZO <=4 SENSITIVE Sensitive     Extended ESBL NEGATIVE Sensitive     * 30,000 COLONIES/mL ESCHERICHIA COLI     Radiological Exams on Admission: No results found.    Assessment/Plan Principal Problem:   AKI (acute kidney injury) ([place]) Active Problems:   Human immunodeficiency virus (HIV) disease ([place])   HYPERLIPIDEMIA, MIXED   HTN (hypertension)   PVD   Alopecia areata   BREAST CANCER, HX OF   Hypothyroidism   Hypotension     #1 acute kidney injury: Most likely prerenal.  Patient also took her Benicar that she should be off.  Also poor oral intake.  Patient will be readmitted.  Aggressively hydrate.  Hold ACE inhibitor.  Monitor renal function  #2 decubitus ulcer of the buttocks: Patient will need aggressive wound care.  Area is noted to be damp with urine and feces.  She will need skilled facility.  Probably diversion.  Currently Foley catheter in inserted today to divert urine.  Aggressive wound care will be required.  #3 malignant breast cancer: Continue care by Dr. Jana Hakim.  #4 hypertension: Blood pressure is actually low now.  So hold blood pressure medications.  #5 hypothyroidism: Continue with home regimen.  #6 hyperlipidemia: Continue with statin  #7 protein calorie malnutrition: Patient appears emaciated.  Also albumin very low.  Continue to encourage protein intake  #8 pancytopenia: Most likely related to chemotherapy.  Continue to monitor CBC.   DVT prophylaxis: Heparin Code Status: DNR Family Communication: Daughter at bedside Disposition Plan: To skilled facility Consults called: Social worker consult in the morning Admission status: Observation  Severity of Illness: The appropriate patient status for this  patient is OBSERVATION. Observation status is judged to be reasonable and necessary in order to provide the required intensity of service to ensure the patient's safety. The patient's presenting symptoms, physical exam findings, and initial radiographic and laboratory data in the context of their medical condition is felt to place them at decreased risk for further clinical deterioration. Furthermore, it is anticipated that the patient will be medically stable for discharge from the hospital within 2 midnights of admission. The following factors support the patient status of observation.   " The patient's presenting symptoms include weakness. " The physical exam findings include large decubitus ulcer. " The initial radiographic and laboratory data are renal  function worsening.     Barbette Merino MD [facility] Pager [phone]  If 7PM-7AM, please contact night-coverage [email or web address] Password [facility]  06/01/2019, 10:55 PM

## 2019-06-02 ENCOUNTER — Other Ambulatory Visit: Payer: POS

## 2019-06-02 ENCOUNTER — Other Ambulatory Visit: Payer: Self-pay

## 2019-06-02 ENCOUNTER — Encounter (HOSPITAL_COMMUNITY): Payer: Self-pay | Admitting: General Practice

## 2019-06-02 ENCOUNTER — Ambulatory Visit: Payer: POS

## 2019-06-02 ENCOUNTER — Encounter: Payer: Self-pay | Admitting: Family

## 2019-06-02 DIAGNOSIS — N179 Acute kidney failure, unspecified: Secondary | ICD-10-CM

## 2019-06-02 DIAGNOSIS — R239 Unspecified skin changes: Secondary | ICD-10-CM | POA: Diagnosis present

## 2019-06-02 DIAGNOSIS — Z66 Do not resuscitate: Secondary | ICD-10-CM | POA: Diagnosis present

## 2019-06-02 HISTORY — DX: Acute kidney failure, unspecified: N17.9

## 2019-06-02 LAB — GLUCOSE, CAPILLARY
Glucose-Capillary: 102 mg/dL — ABNORMAL HIGH (ref 70–99)
Glucose-Capillary: 85 mg/dL (ref 70–99)

## 2019-06-02 LAB — CBC
HCT: 24.5 % — ABNORMAL LOW (ref 36.0–46.0)
HCT: 25.2 % — ABNORMAL LOW (ref 36.0–46.0)
Hemoglobin: 7.9 g/dL — ABNORMAL LOW (ref 12.0–15.0)
Hemoglobin: 8.2 g/dL — ABNORMAL LOW (ref 12.0–15.0)
MCH: 31.5 pg (ref 26.0–34.0)
MCH: 31.3 pg (ref 26.0–34.0)
MCHC: 32.2 g/dL (ref 30.0–36.0)
MCHC: 32.5 g/dL (ref 30.0–36.0)
MCV: 97.6 fL (ref 80.0–100.0)
MCV: 96.2 fL (ref 80.0–100.0)
Platelets: 61 10*3/uL — ABNORMAL LOW (ref 150–400)
Platelets: 67 10*3/uL — ABNORMAL LOW (ref 150–400)
RBC: 2.51 MIL/uL — ABNORMAL LOW (ref 3.87–5.11)
RBC: 2.62 MIL/uL — ABNORMAL LOW (ref 3.87–5.11)
RDW: 18.2 % — ABNORMAL HIGH (ref 11.5–15.5)
RDW: 18.5 % — ABNORMAL HIGH (ref 11.5–15.5)
WBC: 2.4 10*3/uL — ABNORMAL LOW (ref 4.0–10.5)
WBC: 2.5 10*3/uL — ABNORMAL LOW (ref 4.0–10.5)
nRBC: 0 % (ref 0.0–0.2)
nRBC: 0 % (ref 0.0–0.2)

## 2019-06-02 LAB — COMPREHENSIVE METABOLIC PANEL
ALT: 28 U/L (ref 0–44)
AST: 20 U/L (ref 15–41)
Albumin: 2.2 g/dL — ABNORMAL LOW (ref 3.5–5.0)
Alkaline Phosphatase: 42 U/L (ref 38–126)
Anion gap: 10 (ref 5–15)
BUN: 35 mg/dL — ABNORMAL HIGH (ref 8–23)
CO2: 20 mmol/L — ABNORMAL LOW (ref 22–32)
Calcium: 8.4 mg/dL — ABNORMAL LOW (ref 8.9–10.3)
Chloride: 112 mmol/L — ABNORMAL HIGH (ref 98–111)
Creatinine, Ser: 1.61 mg/dL — ABNORMAL HIGH (ref 0.44–1.00)
GFR calc Af Amer: 39 mL/min — ABNORMAL LOW (ref >60)
GFR calc non Af Amer: 34 mL/min — ABNORMAL LOW (ref >60)
Glucose, Bld: 122 mg/dL — ABNORMAL HIGH (ref 70–99)
Potassium: 4.1 mmol/L (ref 3.5–5.1)
Sodium: 142 mmol/L (ref 135–145)
Total Bilirubin: 0.4 mg/dL (ref 0.3–1.2)
Total Protein: 5 g/dL — ABNORMAL LOW (ref 6.5–8.1)

## 2019-06-02 LAB — CULTURE, BLOOD (ROUTINE X 2)
Culture: NO GROWTH
Culture: NO GROWTH
Special Requests: ADEQUATE

## 2019-06-02 LAB — SARS CORONAVIRUS 2 (TAT 6-24 HRS): SARS Coronavirus 2: NEGATIVE

## 2019-06-02 MED ORDER — DEXAMETHASONE 0.5 MG PO TABS: 1.0000 mg | ORAL_TABLET | Freq: Every day | ORAL | Status: DC

## 2019-06-02 MED ORDER — MARAVIROC 300 MG PO TABS
150.0000 mg | ORAL_TABLET | Freq: Two times a day (BID) | ORAL | Status: DC
  Administered 2019-06-02 – 2019-06-17 (×28): 150 mg via ORAL
  Filled 2019-06-02 (×35): qty 1

## 2019-06-02 MED ORDER — HYDROCODONE-ACETAMINOPHEN 5-325 MG PO TABS
1.0000 | ORAL_TABLET | Freq: Four times a day (QID) | ORAL | Status: DC | PRN
  Administered 2019-06-02 – 2019-06-29 (×11): 1 via ORAL
  Filled 2019-06-02 (×11): qty 1

## 2019-06-02 MED ORDER — LEVOTHYROXINE SODIUM 100 MCG PO TABS
200.0000 ug | ORAL_TABLET | Freq: Every day | ORAL | Status: DC
  Administered 2019-06-02 – 2019-06-17 (×15): 200 ug via ORAL
  Filled 2019-06-02 (×15): qty 2

## 2019-06-02 MED ORDER — DEXAMETHASONE 2 MG PO TABS
2.0000 mg | ORAL_TABLET | Freq: Every day | ORAL | Status: DC
  Administered 2019-06-02 – 2019-06-03 (×2): 2 mg via ORAL
  Filled 2019-06-02 (×2): qty 1

## 2019-06-02 MED ORDER — DOLUTEGRAVIR SODIUM 50 MG PO TABS
50.0000 mg | ORAL_TABLET | Freq: Every day | ORAL | Status: DC
  Administered 2019-06-02 – 2019-06-29 (×18): 50 mg via ORAL
  Filled 2019-06-02 (×31): qty 1

## 2019-06-02 MED ORDER — CHLORHEXIDINE GLUCONATE CLOTH 2 % EX PADS
6.0000 | MEDICATED_PAD | Freq: Every day | CUTANEOUS | Status: DC
  Administered 2019-06-02 – 2019-07-04 (×25): 6 via TOPICAL

## 2019-06-02 MED ORDER — RILPIVIRINE HCL 25 MG PO TABS
25.0000 mg | ORAL_TABLET | Freq: Every day | ORAL | Status: DC
  Administered 2019-06-02 – 2019-06-29 (×18): 25 mg via ORAL
  Filled 2019-06-02 (×32): qty 1

## 2019-06-02 MED ORDER — SODIUM CHLORIDE 0.9 % IV SOLN
INTRAVENOUS | Status: DC
  Administered 2019-06-02 – 2019-06-03 (×3): via INTRAVENOUS

## 2019-06-02 MED ORDER — ZINC OXIDE 40 % EX OINT
TOPICAL_OINTMENT | Freq: Three times a day (TID) | CUTANEOUS | Status: DC
  Administered 2019-06-02 – 2019-06-05 (×7): via TOPICAL
  Filled 2019-06-02: qty 57

## 2019-06-02 MED ORDER — FLUCONAZOLE 100 MG PO TABS
400.0000 mg | ORAL_TABLET | Freq: Every day | ORAL | Status: DC
  Administered 2019-06-02: 400 mg via ORAL
  Filled 2019-06-02: qty 2

## 2019-06-02 MED ORDER — RILPIVIRINE HCL 25 MG PO TABS
25.0000 mg | ORAL_TABLET | Freq: Every day | ORAL | Status: DC
  Filled 2019-06-02: qty 1

## 2019-06-02 MED ORDER — FLUCONAZOLE IN SODIUM CHLORIDE 200-0.9 MG/100ML-% IV SOLN
200.0000 mg | INTRAVENOUS | Status: DC
  Administered 2019-06-02 – 2019-06-04 (×3): 200 mg via INTRAVENOUS
  Filled 2019-06-02 (×4): qty 100

## 2019-06-02 MED ORDER — CEFDINIR 300 MG PO CAPS
300.0000 mg | ORAL_CAPSULE | Freq: Two times a day (BID) | ORAL | Status: AC
  Administered 2019-06-02 – 2019-06-04 (×6): 300 mg via ORAL
  Filled 2019-06-02 (×6): qty 1

## 2019-06-02 MED ORDER — DOLUTEGRAVIR SODIUM 50 MG PO TABS
50.0000 mg | ORAL_TABLET | Freq: Every day | ORAL | Status: DC
  Filled 2019-06-02: qty 1

## 2019-06-02 MED ORDER — ENSURE ENLIVE PO LIQD: 237.0000 mL | Freq: Three times a day (TID) | ORAL | Status: DC

## 2019-06-02 MED ORDER — DARUNAVIR-COBICISTAT 800-150 MG PO TABS
1.0000 | ORAL_TABLET | Freq: Every day | ORAL | Status: DC
  Administered 2019-06-02 – 2019-06-29 (×18): 1 via ORAL
  Filled 2019-06-02 (×31): qty 1

## 2019-06-02 NOTE — ED Notes (Signed)
Tele   Breakfast ordered  

## 2019-06-02 NOTE — ED Notes (Signed)
Pt provided crackers, cheese and ginger ale per her request

## 2019-06-02 NOTE — ED Notes (Signed)
Lunch Tray Ordered @ 1042. 

## 2019-06-02 NOTE — Evaluation (Signed)
Physical Therapy Evaluation Patient Details Name: Kayla Price MRN: [id number] DOB: 12/13/1955 Today's Date: 06/02/2019   History of Present Illness  Kayla Price is a 63 y.o. female with medical history significant of metastatic breast cancer on chemotherapy, HIV disease, hyperlipidemia, chronic kidney disease stage II, hypertension, anemia of chronic disease, protein calorie malnutrition and s/p d/c home after 4 day admissinon for AKI and buttock wound.  She is admitted with continued weakness and with wound that could not be managed at home and urinary retention.  Clinical Impression  Patient presents with decreased mobility due to deficits listed in PT problem list.  Patient needing mod to max A for bed mobility and transfers and min a for ambulation with RW.  She has limited tolerance due to pain and fatigue, but is willing to work to get stronger and heal wounds.  Feel she will benefit from SNF level rehab upon d/c.  PT to follow acutely.  NOTE:  Feel she would also benefit from [place] consult.     Follow Up Recommendations SNF    Equipment Recommendations  None recommended by PT    Recommendations for Other Services       Precautions / Restrictions Precautions Precautions: Fall Precaution Comments: watch HR Restrictions Weight Bearing Restrictions: No      Mobility  Bed Mobility Overal bed mobility: Needs Assistance Bed Mobility: Rolling;Sidelying to Sit;Sit to Sidelying Rolling: Mod assist Sidelying to sit: Mod assist;Max assist     Sit to sidelying: Mod assist General bed mobility comments: cues for technique and used rail and elevated HOB, assist for trunk with pt pulling up on rail and PT and assist to get legs off bed, to sidelying assist for legs only with cues  Transfers Overall transfer level: Needs assistance Equipment used: Rolling walker (2 wheeled) Transfers: Sit to/from Stand Sit to Stand: From elevated surface;Mod assist          General transfer comment: raised bed up to ease transition and pt stood with some lifting help to RW  Ambulation/Gait Ambulation/Gait assistance: Min assist Gait Distance (Feet): 80 Feet Assistive device: Rolling walker (2 wheeled) Gait Pattern/deviations: Shuffle;Decreased stride length;Step-to pattern;Wide base of support     General Gait Details: HR up to 110 with ambulation and some dyspnea, shuffling gait and some audible crepitus at times from L knee  Stairs            Wheelchair Mobility    Modified Rankin (Stroke Patients Only)       Balance Overall balance assessment: Needs assistance Sitting-balance support: Feet supported Sitting balance-Leahy Scale: Fair     Standing balance support: Bilateral upper extremity supported Standing balance-Leahy Scale: Poor Standing balance comment: reliant on UE support                             Pertinent Vitals/Pain Pain Assessment: Faces Faces Pain Scale: Hurts even more Pain Location: wounds on buttock and perineal area Pain Descriptors / Indicators: Grimacing;Sore Pain Intervention(s): Monitored during session;Repositioned;Patient requesting pain meds-RN notified    Home Living Family/patient expects to be discharged to:: Skilled nursing facility Living Arrangements: Children;Other relatives Available Help at Discharge: Family Type of Home: House Home Access: Ramped entrance     Home Layout: One level Home Equipment: [place] - single point;Toilet riser;Wheelchair - manual Additional Comments: states neice has been able to get her a wheelchair    Prior Function Level of Independence: Independent with assistive  device(s)               Hand Dominance        Extremity/Trunk Assessment   Upper Extremity Assessment Upper Extremity Assessment: Generalized weakness    Lower Extremity Assessment RLE Deficits / Details: AAROM limited knee flexion about 50 in supine, but more noted in sitting,  strength hip flexion 2-/5, knee extension 3+/5, edema throughout LE on R more than L LLE Deficits / Details: AAROM knee flexion limited about 40 in supine, but more noted in sitting, strength hip flexion 2-/5, knee extension 3/5; edema throughout LE on R more than L    Cervical / Trunk Assessment Cervical / Trunk Exceptions: significant core weakness evident with mobility  Communication   Communication: No difficulties  Cognition Arousal/Alertness: Awake/alert Behavior During Therapy: WFL for tasks assessed/performed Overall Cognitive Status: Within Functional Limits for tasks assessed                                        General Comments General comments (skin integrity, edema, etc.): changed bed pad as soiled; asked RN about air mattress, she reports none yet available    Exercises     Assessment/Plan    PT Assessment Patient needs continued PT services  PT Problem List Decreased strength;Decreased activity tolerance;Decreased mobility;Decreased balance;Decreased knowledge of use of DME;Pain       PT Treatment Interventions DME instruction;Therapeutic activities;Balance training;Therapeutic exercise;Functional mobility training;Gait training;Patient/family education    PT Goals (Current goals can be found in the Care Plan section)  Acute Rehab PT Goals Patient Stated Goal: to go to rehab to get help with wound and to work to get stronger PT Goal Formulation: With patient Time For Goal Achievement: 06/16/19 Potential to Achieve Goals: Fair    Frequency Min 2X/week   Barriers to discharge        Co-evaluation               AM-PAC PT "6 Clicks" Mobility  Outcome Measure Help needed turning from your back to your side while in a flat bed without using bedrails?: A Lot Help needed moving from lying on your back to sitting on the side of a flat bed without using bedrails?: Total Help needed moving to and from a bed to a chair (including a  wheelchair)?: A Lot Help needed standing up from a chair using your arms (e.g., wheelchair or bedside chair)?: A Lot Help needed to walk in hospital room?: A Little Help needed climbing 3-5 steps with a railing? : Total 6 Click Score: 11    End of Session Equipment Utilized During Treatment: Gait belt Activity Tolerance: Patient tolerated treatment well Patient left: in bed;with call bell/phone within reach   PT Visit Diagnosis: Other abnormalities of gait and mobility (R26.89);Muscle weakness (generalized) (M62.81);Pain Pain - part of body: (buttocks and perineum)    Time: 3016-0109 PT Time Calculation (min) (ACUTE ONLY): 24 min   Charges:   PT Evaluation $PT Eval Moderate Complexity: 1 Mod PT Treatments $Gait Training: 8-22 mins        Magda Kiel, [place] [facility] [phone] 06/02/2019   Reginia Naas 06/02/2019, 3:44 PM

## 2019-06-02 NOTE — Progress Notes (Signed)
NEW ADMISSION NOTE New Admission Note:   Arrival Method: via stretcher from ED Mental Orientation: alert and oriented x4 Telemetry: box 12, CCMD notified. Assessment: Completed Skin: warm, dry, pressure injury on R and L buttock. MASD in in groin area. WOC consulted IV: Port-a-cath accessed Pain: 0-10 Safety Measures: Safety Fall Prevention Plan has been given, discussed and signed Admission: in process 5 [facility] Orientation: Patient has been orientated to the room, unit and staff.   Orders have been reviewed and implemented. Will continue to monitor the patient. Call light has been placed within reach and bed alarm has been activated.   Orville Govern, RN

## 2019-06-02 NOTE — Progress Notes (Signed)
Paged portable equipment for an air-mattress replacement while pt is still in the ED, received a called back that they don't have currently have an air mattress available but will deliver it as soon as one becomes available.

## 2019-06-02 NOTE — Consult Note (Addendum)
WOC consult: Pt is admitted with a large stage 4 sacrum wound with significant amt drainage and strong foul odor, according to the progress notes.  Pt is familiar to the [facility] team from a recent admission, refer to progress notes on 12/3 and 12/4. Wound is described as greatly declined since that time and may need a surgical consult. Sent a secure chat message to Dr Eliseo Squires; please place an image in the patient's chart for the sacral wound, the [place] nurses are performing telehealth consultations today. Thank you.  I will follow up with my recommendations/orders once I have reviewed the patient's chart and images. Julien Girt MSN, RN, [place], [place], [place]  Addendum at 15:00: Refer to photos placed in the EMR.  Appearance and location are NOT consistent with pressure injuries; suspect this is related to a severe case of moisture associated skin damage and candidiasis.  Wounds are scattered, multiple full thickness to buttocks and inner groin, yellow and moist with large amt yellow drainage. It will be difficult to protect area from becoming soiled and promote healing if patient remains incontinent, and dressings would be difficult to remian in place.  Pt could possibly have herpatic lesions and is HIV positive; she could benefit from an ID consult and IV antifungal. Discussed via secure chat with Dr Eliseo Squires.  Plan:  Air mattress to reduce pressure and increase airflow.  Recommend Foley to promote healing.  Desitin and antifungal powder to repel moisture and promote drying and healing.  Please re-consult if further assistance is needed.  Thank-you,  Julien Girt MSN, [place], [place], [place], [place]

## 2019-06-02 NOTE — Progress Notes (Addendum)
Progress Note    Kayla Price  ELF:[id number] DOB: Feb 07, 1956  DOA: 06/01/2019 PCP: Lauree Chandler, NP    Brief Narrative:     Medical records reviewed and are as summarized below:  Kayla Price is an 63 y.o. female with medical history significant of metastatic breast cancer on chemotherapy, HIV disease, hyperlipidemia, chronic kidney disease stage II, hypertension, anemia of chronic disease, protein calorie malnutrition who presents to the ER after discharge from the hospital only yesterday.  Patient was in the hospital for 4 days with acute kidney injury as well as stage IV decubitus ulcer of the sacrum.  She was getting wound care in the hospital.  Daughter brought patient home yesterday but apparently she got worse.   Assessment/Plan:   Principal Problem:   AKI (acute kidney injury) ([place]) Active Problems:   Human immunodeficiency virus (HIV) disease ([place])   HYPERLIPIDEMIA, MIXED   HTN (hypertension)   PVD   Alopecia areata   BREAST CANCER, HX OF   Hypothyroidism   Hypotension   Moisture associated Skin breakdown -in the inguinal folds bilaterally.   -picture under media -oozing from the wound there is no surrounding erythema or palpable abscess -I did not see any decubitus ulcer -WOC consult -IV diflucan ? Herpetic lesions- will swab and get ID consult in the AM  AKI on CKD stage IIIa with urinary retention -Baseline creatinine 1.3-1.4. -Held diuretic/ARB. Avoid nephrotoxic agents/contrast. -foley placed -gentle IVF  Acute urinary retention -foley placed -will leave in to help with healing  Hypotension -similar presentation to prior -Will continue decadron for now with taper -d/c home BP medications -may need IV dose of steroids  HIV -Followed by infectious disease. Last CD4 count475 on 10/26. -Remains stable at this time -resume home meds  Hypertension -Holding irbesartan and hydrochlorothiazide given AKI  Hypothyroidism  -TSH normal on 04/03/2019. -Continue Synthroid  Anemia of chronic disease -Hemoglobin 8.2 -trend, lower than prior  Metastatic breast cancer currently on chemo -Oncology following   thrombocytopenia Possibly related to chemo. -monitor -d/c heparin    Family Communication/Anticipated D/C date and plan/Code Status   DVT prophylaxis: scd Code Status: DNR Family Communication:  Disposition Plan: reported that patient will need SNF placement as family not able to care for her at home   Medical Consultants:    None.     Subjective:   Patient noticed that her urine was getting darker and her legs were beginning to swell at home  Objective:    Vitals:   06/02/19 0824 06/02/19 0830 06/02/19 0900 06/02/19 0930  BP: 105/73 99/74 98/72  104/68  Pulse: 70 71 72 71  Resp: 18 (!) 22 16 (!) 23  Temp:      TempSrc:      SpO2: 100% 100% 100% 100%  Weight:      Height:        Intake/Output Summary (Last 24 hours) at 06/02/2019 1034 Last data filed at 06/01/2019 2120 Gross per 24 hour  Intake 2000 ml  Output -  Net 2000 ml   Filed Weights   06/01/19 1623 06/01/19 1651  Weight: 90.7 kg 83 kg    Exam: In bed, NAD Foley draining golden color urine rrr No increased work of breathing +LE edema Pleasant and cooperative  Data Reviewed:   I have personally reviewed following labs and imaging studies:  Labs: Labs show the following:   Basic Metabolic Panel: Recent Labs  Lab 05/29/19 0551 05/30/19 0515 05/31/19 0500 06/01/19 1825  06/01/19 2319 06/02/19 0525  NA 144 147* 142 139  --  142  K 3.9 4.1 4.2 4.8  --  4.1  CL 119* 120* 114* 113*  --  112*  CO2 18* 20* 21* 20*  --  20*  GLUCOSE 104* 118* 95 129*  --  122*  BUN 72* 50* 36* 39*  --  35*  CREATININE 1.39* 1.15* 1.19* 1.75* 1.68* 1.61*  CALCIUM 8.5* 8.8* 8.6* 8.8*  --  8.4*   GFR Estimated Creatinine Clearance: 39.6 mL/min (A) (by C-G formula based on SCr of 1.61 mg/dL (H)). Liver Function  Tests: Recent Labs  Lab 05/27/19 2327 05/30/19 0515 05/31/19 0500 06/01/19 1825 06/02/19 0525  AST 26 22 22 18 20   ALT 46* 37 36 31 28  ALKPHOS 48 52 45 42 42  BILITOT 1.0 0.6 0.5 0.2* 0.4  PROT 6.2* 5.2* 5.2* 5.5* 5.0*  ALBUMIN 3.3* 2.4* 2.3* 2.5* 2.2*   No results for input(s): LIPASE, AMYLASE in the last 168 hours. No results for input(s): AMMONIA in the last 168 hours. Coagulation profile No results for input(s): INR, PROTIME in the last 168 hours.  CBC: Recent Labs  Lab 05/27/19 2327  05/30/19 0515 05/31/19 0500 06/01/19 1825 06/01/19 2319 06/02/19 0525  WBC 6.4   < > 1.8* 1.8* 2.5* 2.4* 2.5*  NEUTROABS 5.9  --   --   --  1.8  --   --   HGB 11.2*   < > 8.9* 8.7* 8.7* 7.9* 8.2*  HCT 34.4*   < > 27.5* 26.5* 26.5* 24.5* 25.2*  MCV 95.6   < > 96.5 95.3 95.3 97.6 96.2  PLT 54*   < > 49* 54* 65* 61* 67*   < > = values in this interval not displayed.   Cardiac Enzymes: No results for input(s): CKTOTAL, CKMB, CKMBINDEX, TROPONINI in the last 168 hours. BNP (last 3 results) No results for input(s): PROBNP in the last 8760 hours. CBG: No results for input(s): GLUCAP in the last 168 hours. D-Dimer: No results for input(s): DDIMER in the last 72 hours. Hgb A1c: No results for input(s): HGBA1C in the last 72 hours. Lipid Profile: No results for input(s): CHOL, HDL, LDLCALC, TRIG, CHOLHDL, LDLDIRECT in the last 72 hours. Thyroid function studies: No results for input(s): TSH, T4TOTAL, T3FREE, THYROIDAB in the last 72 hours.  Invalid input(s): FREET3 Anemia work up: No results for input(s): VITAMINB12, FOLATE, FERRITIN, TIBC, IRON, RETICCTPCT in the last 72 hours. Sepsis Labs: Recent Labs  Lab 05/28/19 0224 05/28/19 1822 05/28/19 2108  05/31/19 0500 06/01/19 1825 06/01/19 2319 06/02/19 0525  WBC  --   --   --    < > 1.8* 2.5* 2.4* 2.5*  LATICACIDVEN 2.0* 2.0* 1.5  --   --  1.4  --   --    < > = values in this interval not displayed.    Microbiology Recent  Results (from the past 240 hour(s))  SARS CORONAVIRUS 2 (TAT 6-24 HRS) Nasopharyngeal Nasopharyngeal Swab     Status: None   Collection Time: 05/28/19  5:07 AM   Specimen: Nasopharyngeal Swab  Result Value Ref Range Status   SARS Coronavirus 2 NEGATIVE NEGATIVE Final    Comment: (NOTE) SARS-CoV-2 target nucleic acids are NOT DETECTED. The SARS-CoV-2 RNA is generally detectable in upper and lower respiratory specimens during the acute phase of infection. Negative results do not preclude SARS-CoV-2 infection, do not rule out co-infections with other pathogens, and should not be used  as the sole basis for treatment or other patient management decisions. Negative results must be combined with clinical observations, patient history, and epidemiological information. The expected result is Negative. Fact Sheet for Patients: SugarRoll.be Fact Sheet for Healthcare Providers: https://www.woods-mathews.com/ This test is not yet approved or cleared by the [place] FDA and  has been authorized for detection and/or diagnosis of SARS-CoV-2 by FDA under an Emergency Use Authorization (EUA). This EUA will remain  in effect (meaning this test can be used) for the duration of the COVID-19 declaration under Section 56 4(b)(1) of the Act, 21 U.S.C. section 360bbb-3(b)(1), unless the authorization is terminated or revoked sooner. Performed at [facility], [place] [address], [place], [place] [address]   Culture, blood (routine x 2)     Status: None   Collection Time: 05/28/19  6:24 AM   Specimen: BLOOD LEFT WRIST  Result Value Ref Range Status   Specimen Description BLOOD LEFT WRIST  Final   Special Requests   Final    BOTTLES DRAWN AEROBIC AND ANAEROBIC Blood Culture results may not be optimal due to an inadequate volume of blood received in culture bottles   Culture   Final    NO GROWTH 5 DAYS Performed at [facility], [place] [address],  [place], [place] [address]    Report Status 06/02/2019 FINAL  Final  Culture, blood (routine x 2)     Status: None   Collection Time: 05/28/19  6:40 AM   Specimen: BLOOD  Result Value Ref Range Status   Specimen Description BLOOD LEFT ANTECUBITAL  Final   Special Requests   Final    BOTTLES DRAWN AEROBIC AND ANAEROBIC Blood Culture adequate volume   Culture   Final    NO GROWTH 5 DAYS Performed at [facility], [place] [address] [place], [place] [address]    Report Status 06/02/2019 FINAL  Final  Urine Culture     Status: Abnormal   Collection Time: 05/28/19 11:17 AM   Specimen: Urine, Random  Result Value Ref Range Status   Specimen Description URINE, RANDOM  Final   Special Requests   Final    NONE Performed at [facility], [place] [address] [place], [place] [address]    Culture 30,000 COLONIES/mL ESCHERICHIA COLI (A)  Final   Report Status 05/30/2019 FINAL  Final   Organism ID, Bacteria ESCHERICHIA COLI (A)  Final      Susceptibility   Escherichia coli - MIC*    AMPICILLIN <=2 SENSITIVE Sensitive     CEFAZOLIN <=4 SENSITIVE Sensitive     CEFTRIAXONE <=1 SENSITIVE Sensitive     CIPROFLOXACIN <=0.25 SENSITIVE Sensitive     GENTAMICIN <=1 SENSITIVE Sensitive     IMIPENEM <=0.25 SENSITIVE Sensitive     NITROFURANTOIN <=16 SENSITIVE Sensitive     TRIMETH/SULFA <=20 SENSITIVE Sensitive     AMPICILLIN/SULBACTAM <=2 SENSITIVE Sensitive     PIP/TAZO <=4 SENSITIVE Sensitive     Extended ESBL NEGATIVE Sensitive     * 30,000 COLONIES/mL ESCHERICHIA COLI  SARS CORONAVIRUS 2 (TAT 6-24 HRS) Nasopharyngeal Nasopharyngeal Swab     Status: None   Collection Time: 06/01/19 10:46 PM   Specimen: Nasopharyngeal Swab  Result Value Ref Range Status   SARS Coronavirus 2 NEGATIVE NEGATIVE Final    Comment: (NOTE) SARS-CoV-2 target nucleic acids are NOT DETECTED. The SARS-CoV-2 RNA is generally detectable in upper and lower respiratory specimens during the acute phase of infection.  Negative results do  not preclude SARS-CoV-2 infection, do not rule out co-infections with other pathogens, and should not be used as the sole basis for treatment or other patient management decisions. Negative results must be combined with clinical observations, patient history, and epidemiological information. The expected result is Negative. Fact Sheet for Patients: SugarRoll.be Fact Sheet for Healthcare Providers: https://www.woods-mathews.com/ This test is not yet approved or cleared by the [place] FDA and  has been authorized for detection and/or diagnosis of SARS-CoV-2 by FDA under an Emergency Use Authorization (EUA). This EUA will remain  in effect (meaning this test can be used) for the duration of the COVID-19 declaration under Section 56 4(b)(1) of the Act, 21 U.S.C. section 360bbb-3(b)(1), unless the authorization is terminated or revoked sooner. Performed at [facility], [place] [address], [place], [place] [address]     Procedures and diagnostic studies:  No results found.  Medications:   . cefdinir  300 mg Oral BID  . darunavir-cobicistat  1 tablet Oral QHS  . [START ON 06/05/2019] dexamethasone  1 mg Oral Daily  . dexamethasone  2 mg Oral Daily  . fluconazole  400 mg Oral Daily  . heparin  5,000 Units Subcutaneous Q8H  . levothyroxine  200 mcg Oral QAC breakfast  . maraviroc  150 mg Oral BID   Continuous Infusions: . sodium chloride 75 mL/hr at 06/02/19 0830     LOS: 0 days   Geradine Girt  [facility]   How to contact the [facility] Attending or Consulting provider [place] or covering provider during after hours [place], for this patient?  1. Check the care team in [facility] and look for a) attending/consulting [facility] provider listed and b) the [facility] team listed 2. Log into [email or web address] and use [place]'s universal password to access. If you do not have the password, please contact the hospital operator. 3.  Locate the [facility] provider you are looking for under [facility] and page to a number that you can be directly reached. 4. If you still have difficulty reaching the provider, please page the [facility] (Director on Call) for the Hospitalists listed on amion for assistance.  06/02/2019, 10:34 AM

## 2019-06-03 DIAGNOSIS — I959 Hypotension, unspecified: Secondary | ICD-10-CM | POA: Diagnosis not present

## 2019-06-03 DIAGNOSIS — G936 Cerebral edema: Secondary | ICD-10-CM | POA: Diagnosis present

## 2019-06-03 DIAGNOSIS — R0902 Hypoxemia: Secondary | ICD-10-CM | POA: Diagnosis not present

## 2019-06-03 DIAGNOSIS — Z515 Encounter for palliative care: Secondary | ICD-10-CM | POA: Diagnosis not present

## 2019-06-03 DIAGNOSIS — B20 Human immunodeficiency virus [HIV] disease: Secondary | ICD-10-CM | POA: Diagnosis present

## 2019-06-03 DIAGNOSIS — I82431 Acute embolism and thrombosis of right popliteal vein: Secondary | ICD-10-CM | POA: Diagnosis not present

## 2019-06-03 DIAGNOSIS — I10 Essential (primary) hypertension: Secondary | ICD-10-CM | POA: Diagnosis not present

## 2019-06-03 DIAGNOSIS — A6000 Herpesviral infection of urogenital system, unspecified: Secondary | ICD-10-CM | POA: Diagnosis not present

## 2019-06-03 DIAGNOSIS — M7989 Other specified soft tissue disorders: Secondary | ICD-10-CM | POA: Diagnosis not present

## 2019-06-03 DIAGNOSIS — R600 Localized edema: Secondary | ICD-10-CM | POA: Diagnosis not present

## 2019-06-03 DIAGNOSIS — I82411 Acute embolism and thrombosis of right femoral vein: Secondary | ICD-10-CM | POA: Diagnosis not present

## 2019-06-03 DIAGNOSIS — J9601 Acute respiratory failure with hypoxia: Secondary | ICD-10-CM | POA: Diagnosis not present

## 2019-06-03 DIAGNOSIS — I130 Hypertensive heart and chronic kidney disease with heart failure and stage 1 through stage 4 chronic kidney disease, or unspecified chronic kidney disease: Secondary | ICD-10-CM | POA: Diagnosis present

## 2019-06-03 DIAGNOSIS — E2740 Unspecified adrenocortical insufficiency: Secondary | ICD-10-CM | POA: Diagnosis present

## 2019-06-03 DIAGNOSIS — J690 Pneumonitis due to inhalation of food and vomit: Secondary | ICD-10-CM | POA: Diagnosis not present

## 2019-06-03 DIAGNOSIS — E440 Moderate protein-calorie malnutrition: Secondary | ICD-10-CM | POA: Diagnosis present

## 2019-06-03 DIAGNOSIS — J189 Pneumonia, unspecified organism: Secondary | ICD-10-CM | POA: Diagnosis not present

## 2019-06-03 DIAGNOSIS — A6009 Herpesviral infection of other urogenital tract: Secondary | ICD-10-CM | POA: Diagnosis not present

## 2019-06-03 DIAGNOSIS — R339 Retention of urine, unspecified: Secondary | ICD-10-CM | POA: Diagnosis present

## 2019-06-03 DIAGNOSIS — R4182 Altered mental status, unspecified: Secondary | ICD-10-CM | POA: Diagnosis not present

## 2019-06-03 DIAGNOSIS — J9622 Acute and chronic respiratory failure with hypercapnia: Secondary | ICD-10-CM | POA: Diagnosis not present

## 2019-06-03 DIAGNOSIS — C7951 Secondary malignant neoplasm of bone: Secondary | ICD-10-CM | POA: Diagnosis present

## 2019-06-03 DIAGNOSIS — Z20822 Contact with and (suspected) exposure to covid-19: Secondary | ICD-10-CM | POA: Diagnosis present

## 2019-06-03 DIAGNOSIS — N179 Acute kidney failure, unspecified: Principal | ICD-10-CM

## 2019-06-03 DIAGNOSIS — E870 Hyperosmolality and hypernatremia: Secondary | ICD-10-CM | POA: Diagnosis not present

## 2019-06-03 DIAGNOSIS — Z66 Do not resuscitate: Secondary | ICD-10-CM | POA: Diagnosis not present

## 2019-06-03 DIAGNOSIS — G92 Toxic encephalopathy: Secondary | ICD-10-CM | POA: Diagnosis not present

## 2019-06-03 DIAGNOSIS — G9341 Metabolic encephalopathy: Secondary | ICD-10-CM | POA: Diagnosis not present

## 2019-06-03 DIAGNOSIS — C50919 Malignant neoplasm of unspecified site of unspecified female breast: Secondary | ICD-10-CM | POA: Diagnosis not present

## 2019-06-03 DIAGNOSIS — Z7189 Other specified counseling: Secondary | ICD-10-CM | POA: Diagnosis not present

## 2019-06-03 DIAGNOSIS — Y92239 Unspecified place in hospital as the place of occurrence of the external cause: Secondary | ICD-10-CM | POA: Diagnosis not present

## 2019-06-03 DIAGNOSIS — B009 Herpesviral infection, unspecified: Secondary | ICD-10-CM | POA: Diagnosis not present

## 2019-06-03 DIAGNOSIS — C7931 Secondary malignant neoplasm of brain: Secondary | ICD-10-CM | POA: Diagnosis present

## 2019-06-03 DIAGNOSIS — I361 Nonrheumatic tricuspid (valve) insufficiency: Secondary | ICD-10-CM | POA: Diagnosis not present

## 2019-06-03 DIAGNOSIS — R239 Unspecified skin changes: Secondary | ICD-10-CM | POA: Diagnosis not present

## 2019-06-03 DIAGNOSIS — N189 Chronic kidney disease, unspecified: Secondary | ICD-10-CM | POA: Diagnosis not present

## 2019-06-03 DIAGNOSIS — C782 Secondary malignant neoplasm of pleura: Secondary | ICD-10-CM | POA: Diagnosis present

## 2019-06-03 DIAGNOSIS — I340 Nonrheumatic mitral (valve) insufficiency: Secondary | ICD-10-CM | POA: Diagnosis not present

## 2019-06-03 DIAGNOSIS — E872 Acidosis: Secondary | ICD-10-CM | POA: Diagnosis not present

## 2019-06-03 DIAGNOSIS — C7800 Secondary malignant neoplasm of unspecified lung: Secondary | ICD-10-CM | POA: Diagnosis present

## 2019-06-03 DIAGNOSIS — L89154 Pressure ulcer of sacral region, stage 4: Secondary | ICD-10-CM | POA: Diagnosis present

## 2019-06-03 DIAGNOSIS — J9621 Acute and chronic respiratory failure with hypoxia: Secondary | ICD-10-CM | POA: Diagnosis not present

## 2019-06-03 DIAGNOSIS — C7989 Secondary malignant neoplasm of other specified sites: Secondary | ICD-10-CM | POA: Diagnosis present

## 2019-06-03 DIAGNOSIS — G9340 Encephalopathy, unspecified: Secondary | ICD-10-CM | POA: Diagnosis not present

## 2019-06-03 LAB — GLUCOSE, CAPILLARY: Glucose-Capillary: 67 mg/dL — ABNORMAL LOW (ref 70–99)

## 2019-06-03 MED ORDER — ADULT MULTIVITAMIN W/MINERALS CH
1.0000 | ORAL_TABLET | Freq: Every day | ORAL | Status: DC
  Administered 2019-06-03 – 2019-06-29 (×19): 1 via ORAL
  Filled 2019-06-03 (×19): qty 1

## 2019-06-03 MED ORDER — ENSURE ENLIVE PO LIQD
237.0000 mL | Freq: Three times a day (TID) | ORAL | Status: DC
  Administered 2019-06-03 – 2019-06-29 (×30): 237 mL via ORAL

## 2019-06-03 NOTE — Plan of Care (Signed)
  Problem: Education: Goal: Knowledge of General Education information will improve Description: Including pain rating scale, medication(s)/side effects and non-pharmacologic comfort measures 06/03/2019 0203 by Babs Sciara, RN Outcome: Progressing 06/03/2019 0203 by Babs Sciara, RN Outcome: Progressing

## 2019-06-03 NOTE — Progress Notes (Signed)
Kayla Price   DOB:[birth year]   JI#:[id number]   FBP#:[id number]  Subjective:  Kayla Price got readmitted partly due to some mistakes in how to take her medications and partly because he daughter (who has a full time job and a child to care for) was not able to properly care for her. Today Kayla Price feels much better. She is eating a good breakfast (was unable to swallow before), her speech is clear (wa slurred) and she is benefiting from PT (was able to walk "3 rooms down" with help). She does not mind the foley and understands why she needs it for now   Objective: middle aged [place] woman examined in bed Vitals:   06/03/19 0613 06/03/19 0847  BP: 106/75 99/66  Pulse: 70 82  Resp: 18 18  Temp: 98 F (36.7 C) (!) 97.3 F (36.3 C)  SpO2: 100% 100%    Body mass index is 31.25 kg/m.  Intake/Output Summary (Last 24 hours) at 06/03/2019 1511 Last data filed at 06/03/2019 0900 Gross per 24 hour  Intake 1560 ml  Output 675 ml  Net 885 ml     CBG (last 3)  Recent Labs    06/02/19 1654 06/02/19 2021 06/03/19 0658  GLUCAP 102* 85 67*     Labs:  Lab Results  Component Value Date   WBC 2.5 (L) 06/02/2019   HGB 8.2 (L) 06/02/2019   HCT 25.2 (L) 06/02/2019   MCV 96.2 06/02/2019   PLT 67 (L) 06/02/2019   NEUTROABS 1.8 06/01/2019    _0 @  Urine Studies No results for input(s): UHGB, CRYS in the last 72 hours.  Invalid input(s): UACOL, UAPR, USPG, UPH, UTP, UGL, UKET, UBIL, UNIT, UROB, [place], UEPI, UWBC, Junie Panning [place], [place], [place]  Basic Metabolic Panel: Recent Labs  Lab 05/29/19 0551 05/30/19 0515 05/31/19 0500 06/01/19 1825 06/01/19 2319 06/02/19 0525  NA 144 147* 142 139  --  142  K 3.9 4.1 4.2 4.8  --  4.1  CL 119* 120* 114* 113*  --  112*  CO2 18* 20* 21* 20*  --  20*  GLUCOSE 104* 118* 95 129*  --  122*  BUN 72* 50* 36* 39*  --  35*  CREATININE 1.39* 1.15* 1.19* 1.75* 1.68* 1.61*  CALCIUM 8.5* 8.8* 8.6* 8.8*  --  8.4*   GFR Estimated  Creatinine Clearance: 41.3 mL/min (A) (by C-G formula based on SCr of 1.61 mg/dL (H)). Liver Function Tests: Recent Labs  Lab 05/27/19 2327 05/30/19 0515 05/31/19 0500 06/01/19 1825 06/02/19 0525  AST _1 ALT 46* 37 36 31 28  ALKPHOS 48 52 45 42 42  BILITOT 1.0 0.6 0.5 0.2* 0.4  PROT 6.2* 5.2* 5.2* 5.5* 5.0*  ALBUMIN 3.3* 2.4* 2.3* 2.5* 2.2*   No results for input(s): LIPASE, AMYLASE in the last 168 hours. No results for input(s): AMMONIA in the last 168 hours. Coagulation profile No results for input(s): INR, PROTIME in the last 168 hours.  CBC: Recent Labs  Lab 05/27/19 2327  05/30/19 0515 05/31/19 0500 06/01/19 1825 06/01/19 2319 06/02/19 0525  WBC 6.4   < > 1.8* 1.8* 2.5* 2.4* 2.5*  NEUTROABS 5.9  --   --   --  1.8  --   --   HGB 11.2*   < > 8.9* 8.7* 8.7* 7.9* 8.2*  HCT 34.4*   < > 27.5* 26.5* 26.5* 24.5* 25.2*  MCV 95.6   < > 96.5 95.3 95.3 97.6 96.2  PLT  54*   < > 49* 54* 65* 61* 67*   < > = values in this interval not displayed.   Cardiac Enzymes: No results for input(s): CKTOTAL, CKMB, CKMBINDEX, TROPONINI in the last 168 hours. BNP: Invalid input(s): POCBNP CBG: Recent Labs  Lab 06/02/19 1654 06/02/19 2021 06/03/19 0658  GLUCAP 102* 85 67*   D-Dimer No results for input(s): DDIMER in the last 72 hours. Hgb A1c No results for input(s): HGBA1C in the last 72 hours. Lipid Profile No results for input(s): CHOL, HDL, LDLCALC, TRIG, CHOLHDL, LDLDIRECT in the last 72 hours. Thyroid function studies No results for input(s): TSH, T4TOTAL, T3FREE, THYROIDAB in the last 72 hours.  Invalid input(s): FREET3 Anemia work up No results for input(s): VITAMINB12, FOLATE, FERRITIN, TIBC, IRON, RETICCTPCT in the last 72 hours. Microbiology Recent Results (from the past 240 hour(s))  SARS CORONAVIRUS 2 (TAT 6-24 HRS) Nasopharyngeal Nasopharyngeal Swab     Status: None   Collection Time: 05/28/19  5:07 AM   Specimen: Nasopharyngeal Swab  Result Value  Ref Range Status   SARS Coronavirus 2 NEGATIVE NEGATIVE Final    Comment: (NOTE) SARS-CoV-2 target nucleic acids are NOT DETECTED. The SARS-CoV-2 RNA is generally detectable in upper and lower respiratory specimens during the acute phase of infection. Negative results do not preclude SARS-CoV-2 infection, do not rule out co-infections with other pathogens, and should not be used as the sole basis for treatment or other patient management decisions. Negative results must be combined with clinical observations, patient history, and epidemiological information. The expected result is Negative. Fact Sheet for Patients: SugarRoll.be Fact Sheet for Healthcare Providers: https://www.woods-mathews.com/ This test is not yet approved or cleared by the [place] FDA and  has been authorized for detection and/or diagnosis of SARS-CoV-2 by FDA under an Emergency Use Authorization (EUA). This EUA will remain  in effect (meaning this test can be used) for the duration of the COVID-19 declaration under Section 56 4(b)(1) of the Act, 21 U.S.C. section 360bbb-3(b)(1), unless the authorization is terminated or revoked sooner. Performed at [facility], [place] [address], [place], [place] [address]   Culture, blood (routine x 2)     Status: None   Collection Time: 05/28/19  6:24 AM   Specimen: BLOOD LEFT WRIST  Result Value Ref Range Status   Specimen Description BLOOD LEFT WRIST  Final   Special Requests   Final    BOTTLES DRAWN AEROBIC AND ANAEROBIC Blood Culture results may not be optimal due to an inadequate volume of blood received in culture bottles   Culture   Final    NO GROWTH 5 DAYS Performed at [facility], [place] [address] [place], [place] [address]    Report Status 06/02/2019 FINAL  Final  Culture, blood (routine x 2)     Status: None   Collection Time: 05/28/19  6:40 AM   Specimen: BLOOD  Result Value Ref Range Status    Specimen Description BLOOD LEFT ANTECUBITAL  Final   Special Requests   Final    BOTTLES DRAWN AEROBIC AND ANAEROBIC Blood Culture adequate volume   Culture   Final    NO GROWTH 5 DAYS Performed at [facility], [place] [address] [place], [place] [address]    Report Status 06/02/2019 FINAL  Final  Urine Culture     Status: Abnormal   Collection Time: 05/28/19 11:17 AM   Specimen: Urine, Random  Result Value Ref Range Status   Specimen Description URINE,  RANDOM  Final   Special Requests   Final    NONE Performed at [facility], [place] [address], [place], [place] [address]    Culture 30,000 COLONIES/mL ESCHERICHIA COLI (A)  Final   Report Status 05/30/2019 FINAL  Final   Organism ID, Bacteria ESCHERICHIA COLI (A)  Final      Susceptibility   Escherichia coli - MIC*    AMPICILLIN <=2 SENSITIVE Sensitive     CEFAZOLIN <=4 SENSITIVE Sensitive     CEFTRIAXONE <=1 SENSITIVE Sensitive     CIPROFLOXACIN <=0.25 SENSITIVE Sensitive     GENTAMICIN <=1 SENSITIVE Sensitive     IMIPENEM <=0.25 SENSITIVE Sensitive     NITROFURANTOIN <=16 SENSITIVE Sensitive     TRIMETH/SULFA <=20 SENSITIVE Sensitive     AMPICILLIN/SULBACTAM <=2 SENSITIVE Sensitive     PIP/TAZO <=4 SENSITIVE Sensitive     Extended ESBL NEGATIVE Sensitive     * 30,000 COLONIES/mL ESCHERICHIA COLI  SARS CORONAVIRUS 2 (TAT 6-24 HRS) Nasopharyngeal Nasopharyngeal Swab     Status: None   Collection Time: 06/01/19 10:46 PM   Specimen: Nasopharyngeal Swab  Result Value Ref Range Status   SARS Coronavirus 2 NEGATIVE NEGATIVE Final    Comment: (NOTE) SARS-CoV-2 target nucleic acids are NOT DETECTED. The SARS-CoV-2 RNA is generally detectable in upper and lower respiratory specimens during the acute phase of infection. Negative results do not preclude SARS-CoV-2 infection, do not rule out co-infections with other pathogens, and should not be used as the sole basis for treatment or other patient management  decisions. Negative results must be combined with clinical observations, patient history, and epidemiological information. The expected result is Negative. Fact Sheet for Patients: SugarRoll.be Fact Sheet for Healthcare Providers: https://www.woods-mathews.com/ This test is not yet approved or cleared by the [place] FDA and  has been authorized for detection and/or diagnosis of SARS-CoV-2 by FDA under an Emergency Use Authorization (EUA). This EUA will remain  in effect (meaning this test can be used) for the duration of the COVID-19 declaration under Section 56 4(b)(1) of the Act, 21 U.S.C. section 360bbb-3(b)(1), unless the authorization is terminated or revoked sooner. Performed at [facility], [place] [address] [place], [place] [address]       Studies:  No results found.  Assessment: 63 y.o. [place] woman with stage IV breast cancer, as follows:  (1) status post right lumpectomyand sentinel lymph node sampling October 2005 for a 0.6 cm invasive ductal carcinoma involving one out of 2 sentinel lymph nodes sampled, grade 3, triple-negative, treated adjuvantly with doxorubicin and cyclophosphamide4 followed by weekly paclitaxel7, followed by adjuvant radiation  RECURRENT DISEASE: (2) status post right breast upper outer quadrant biopsy and right axillary lymph node biopsy 10/01/2016, both positive for a T2 N1, stage IIIB invasive ductal carcinoma,triple negative, with an MIB-1 of 50-70%  (3) status post right modified radical mastectomy05/14/2018 showing a pT2 pN1, stage IIIB invasive ductal carcinoma, grade 3, triple negative, with negative margins  (4) not a candidate for radiation given prior history  (5) adjuvant chemotherapy consisting of carboplatin and gemcitabine given days 1 and 8 of each 21 day cycle, for 6 cycles, starting 11/20/2016, completed 03/11/2017 (a) day 8 cycle 2 omitted because of  neutropenia; Neupogen/Neulasta added  (5) HIV positivity: under care of ID Lucianne [place])  (6) Genetic testing 06/17/2017: no pathogenicmutations.Genes tested: APC, ATM, AXIN2, BARD1, BLM, BMPR1A, BRCA1, BRCA2, BRIP1, CDH1, CDK4, CDKN2A (p14ARF), CDKN2A (p16INK4a), CEBPA, CHEK2, CTNNA1, DICER1, EPCAM*, GATA2, GREM1*, HRAS, KIT,  MEN1, MLH1, MSH2, MSH3, MSH6, MUTYH, NBN, NF1, PALB2, PDGFRA, PMS2, POLD1, POLE, PTEN, RAD50, RAD51C, RAD51D, RUNX1, SDHB, SDHC, SDHD, SMAD4, SMARCA4, STK11, TERC, TERT, TP53, TSC1, TSC2, VHL.The following genes were evaluated for sequence changes only: HOXB13*, NTHL1*, SDHA.  (a) A variant of uncertain significance (VUS)in a gene calledNTHL1was also noted.c.[id number] (p.Ala246Thr)  METASTATIC DISEASE: November 2019 (1) Patient seen in urgent care and ultimately ED on 04/14/2018 for shortness of breath, chest xray demonstrated Right pleural effusion.  (a) right thoracenteses on 10/21 and 11/4 results show atypical cells, non diagnostic (b) CT chest 04/22/2018 shows re-accumulation of fluid, and right pleural nodularity. (c) PET scan on 05/01/2018 shows hypermetabolic pleural based metastases, right CP angle nodal metastases, no evidence of malignancy in abdomen and pelvis. (d) bronchoscopy with biopsy by Dr. Lamonte Sakai and BAL on 05/15/2018 was non diagnostic (e) VAT biopsy of the right pleura 12/06/2019confirms carcinoma, triple negative (f) [facility] shows PD-L1 positive(1% in [facility]); otherwise microsatellite stable, TMB low (5/Mb), no PIK3 mutations; other mutations suggest sensitivity to MTOR inhibitors and several TKIs  (2) Atezolizumab started 07/15/2018, initially given every other week (a) PET 08/13/2018 shows tumor Right hemithorax (new baseline study) (b) Atezo changed to Q4w starting with 11/07/2018 dose (c) PET 11/28/2018 documents progression in the right lung and pleural area as well  as lymph nodes, and a T1 skeletal metastasis (d) atezolizumab discontinued after 12/05/2018 dose  (3) eribulin day 1 and 8 of every 21 day cycle started 12/19/2018-- most recent dose 04/17/2019 (a) changed to every other week with Neulasta support due to neutropenia causing treatment delays and her h/o HIV (delays due to insurance denial of onpro after 02/20/2019 dose) (b) PET scan on 03/11/2019 showed stable to improved measurable disease, with multiple new bone lesions possibly "uncovered" by bisphosphonate  (4) zoledronate given every 12 weeks starting on 03/25/2019  (4) brain MRI 04/28/2019 documents multiple intracranial metastases (a)whole brain radiation 04/30/2019 - 05/13/2019, 30 Gy in 10 fractions  Plan:  Dyanne would prefer to have what PT she needs during this admission and go home rather than go to an SNF. She is agreeable to keeping the foley as long as necessary.  She was scheduled for a visit at the [facility] 12/11 but I will cancel that. She will see me next 12/22. Before that visit she has been scheduled for a PET scan. Depending on those results and how much function she has recovered we can decide at that visit how best to proceed.  I greatly appreciate yur help to this patient and her family       Chauncey Cruel, MD 06/03/2019  3:11 PM Medical Oncology and Hematology [facility] [address] [place], [place] [address] Tel. [phone]    Fax. [phone]

## 2019-06-03 NOTE — Plan of Care (Signed)
  Problem: Nutrition: Goal: Adequate nutrition will be maintained Outcome: Progressing   

## 2019-06-03 NOTE — Consult Note (Addendum)
WOC consult requested for perineum wounds.  This was performed yesterday; please refer to previous consult note for assessment and topical treatment recommendations have been provided for staff nurses to perform. Please re-consult if further assistance is needed.  Thank-you,  Julien Girt MSN, [place], [place], [place], [place]

## 2019-06-03 NOTE — Progress Notes (Signed)
Daughter called and stated that she wants her mother to go to a skilled nursing facility and stated that a MD from the ED and Mariann Laster Education officer, museum had told her that the pt's wounds needed extensive care and that she did qualify for SNF. This RN explained to daughter, Orland Dec, that the process for pts to qualify for SNF placement starts with PT & OT evaluation and their recommendations.Social worker, Lorriane Shire is aware of daughter's request.   MD Broadus John to call and update daughter today.

## 2019-06-03 NOTE — Progress Notes (Addendum)
Progress Note    Kayla Price  XHB:[id number] DOB: 1956/05/31  DOA: 06/01/2019 PCP: Lauree Chandler, NP    Brief Narrative:     Medical records reviewed and are as summarized below:  Kayla Price is an 63 y.o. female with medical history significant of metastatic breast cancer on chemotherapy, HIV disease, hyperlipidemia, chronic kidney disease stage II, hypertension, anemia of chronic disease, protein calorie malnutrition who presented to the ER after discharge from the hospital 12/6.  Patient was in the hospital for 4 days with acute kidney injury as well as wound on his her perineum She was getting wound care in the hospital.  Daughter brought patient home yesterday but apparently she got worse.   Assessment/Plan:    Moisture associated Skin breakdown/Intertrigo -in the inguinal folds bilaterally.   -could have candideal infection in addition, I do not suspect Herpes, Cx was sent yesterday by Dr.Vann -will ask Wound RN to eval, keeping this area clean and dry will be the key and challenging -now with foley to prevent further soiling -continue FLuconazole  -family wants her to go to rehab for physical debility and wound care -PT OT following, recommend SNF for rehabilitation as well  AKI on CKD stage IIIa with urinary retention -Baseline creatinine 1.3-1.4. -Held diuretic/ARB. Avoid nephrotoxic agents/contrast. -Creatinine 1.6 today, close to baseline will discontinue IV fluids  Hypotension -Clinically do not suspect adrenal insufficiency, random cortisol was 19 on 12/3, no indication to continue Decadron for this, and in fact will hamper wound healing -She was started on steroids in the ICU for presumed adrenal insufficiency last admission -Discontinue steroids -BP is stable in the low 100 range, ARB and HCTZ stopped  Metastatic breast cancer currently on chemo -Oncology following  HIV -Followed by infectious disease. Last CD4 [id number] on 10/26.  -Resume HIV therapy  Hypertension -Holding irbesartan and hydrochlorothiazide given AKI  Hypothyroidism -TSH normal on 04/03/2019. -Continue Synthroid  Anemia of chronic disease -Hemoglobin 8.2 -trend, lower than prior   thrombocytopenia Possibly related to chemo. -monitor -d/c heparin  Recent E. coli UTI, - currently on cefdinir to complete course -Now with Foley    Family Communication/Anticipated D/C date and plan/Code Status   DVT prophylaxis: scd Code Status: DNR Family Communication:  Disposition Plan: reported that patient will need SNF placement as family not able to care for her at home   Medical Consultants:    Oncology Dr. Jana Hakim     Subjective:   -Feels okay, no events overnight, asking me about rehab  Objective:    Vitals:   06/02/19 1411 06/02/19 2020 06/03/19 0613 06/03/19 0847  BP: (!) 112/91 100/73 106/75 99/66  Pulse: 79 75 70 82  Resp: 18 17 18 18   Temp: 98.4 F (36.9 C) 98.4 F (36.9 C) 98 F (36.7 C) (!) 97.3 F (36.3 C)  TempSrc: Oral Oral Oral Oral  SpO2: 96% 100% 100% 100%  Weight:  90.5 kg    Height:        Intake/Output Summary (Last 24 hours) at 06/03/2019 1126 Last data filed at 06/03/2019 0900 Gross per 24 hour  Intake 1560 ml  Output 1175 ml  Net 385 ml   Filed Weights   06/01/19 1623 06/01/19 1651 06/02/19 2020  Weight: 90.7 kg 83 kg 90.5 kg    Exam: Gen: Obese pleasant female sitting up in bed, AAOx3, no distress HEENT: PERRLA, Neck supple, no JVD Lungs: Good air movement bilaterally, CTAB CVS: RRR,No Gallops,Rubs or new Murmurs Abd:  soft, Non tender, non distended, BS present Extremities: No edema Neuro, moves all extremities, no localizing signs, mild bilateral lower extremity weakness Skin: Perineum with large, extensive skin folds, Foley catheter noted, multiple small open ulcers, no surrounding cellulitis  Data Reviewed:   I have personally reviewed following labs and imaging studies:   Labs: Labs show the following:   Basic Metabolic Panel: Recent Labs  Lab 05/29/19 0551 05/30/19 0515 05/31/19 0500 06/01/19 1825 06/01/19 2319 06/02/19 0525  NA 144 147* 142 139  --  142  K 3.9 4.1 4.2 4.8  --  4.1  CL 119* 120* 114* 113*  --  112*  CO2 18* 20* 21* 20*  --  20*  GLUCOSE 104* 118* 95 129*  --  122*  BUN 72* 50* 36* 39*  --  35*  CREATININE 1.39* 1.15* 1.19* 1.75* 1.68* 1.61*  CALCIUM 8.5* 8.8* 8.6* 8.8*  --  8.4*   GFR Estimated Creatinine Clearance: 41.3 mL/min (A) (by C-G formula based on SCr of 1.61 mg/dL (H)). Liver Function Tests: Recent Labs  Lab 05/27/19 2327 05/30/19 0515 05/31/19 0500 06/01/19 1825 06/02/19 0525  AST 26 22 22 18 20   ALT 46* 37 36 31 28  ALKPHOS 48 52 45 42 42  BILITOT 1.0 0.6 0.5 0.2* 0.4  PROT 6.2* 5.2* 5.2* 5.5* 5.0*  ALBUMIN 3.3* 2.4* 2.3* 2.5* 2.2*   No results for input(s): LIPASE, AMYLASE in the last 168 hours. No results for input(s): AMMONIA in the last 168 hours. Coagulation profile No results for input(s): INR, PROTIME in the last 168 hours.  CBC: Recent Labs  Lab 05/27/19 2327  05/30/19 0515 05/31/19 0500 06/01/19 1825 06/01/19 2319 06/02/19 0525  WBC 6.4   < > 1.8* 1.8* 2.5* 2.4* 2.5*  NEUTROABS 5.9  --   --   --  1.8  --   --   HGB 11.2*   < > 8.9* 8.7* 8.7* 7.9* 8.2*  HCT 34.4*   < > 27.5* 26.5* 26.5* 24.5* 25.2*  MCV 95.6   < > 96.5 95.3 95.3 97.6 96.2  PLT 54*   < > 49* 54* 65* 61* 67*   < > = values in this interval not displayed.   Cardiac Enzymes: No results for input(s): CKTOTAL, CKMB, CKMBINDEX, TROPONINI in the last 168 hours. BNP (last 3 results) No results for input(s): PROBNP in the last 8760 hours. CBG: Recent Labs  Lab 06/02/19 1654 06/02/19 2021 06/03/19 0658  GLUCAP 102* 85 67*   D-Dimer: No results for input(s): DDIMER in the last 72 hours. Hgb A1c: No results for input(s): HGBA1C in the last 72 hours. Lipid Profile: No results for input(s): CHOL, HDL, LDLCALC, TRIG,  CHOLHDL, LDLDIRECT in the last 72 hours. Thyroid function studies: No results for input(s): TSH, T4TOTAL, T3FREE, THYROIDAB in the last 72 hours.  Invalid input(s): FREET3 Anemia work up: No results for input(s): VITAMINB12, FOLATE, FERRITIN, TIBC, IRON, RETICCTPCT in the last 72 hours. Sepsis Labs: Recent Labs  Lab 05/28/19 0224 05/28/19 1822 05/28/19 2108  05/31/19 0500 06/01/19 1825 06/01/19 2319 06/02/19 0525  WBC  --   --   --    < > 1.8* 2.5* 2.4* 2.5*  LATICACIDVEN 2.0* 2.0* 1.5  --   --  1.4  --   --    < > = values in this interval not displayed.    Microbiology Recent Results (from the past 240 hour(s))  SARS CORONAVIRUS 2 (TAT 6-24 HRS) Nasopharyngeal Nasopharyngeal Swab  Status: None   Collection Time: 05/28/19  5:07 AM   Specimen: Nasopharyngeal Swab  Result Value Ref Range Status   SARS Coronavirus 2 NEGATIVE NEGATIVE Final    Comment: (NOTE) SARS-CoV-2 target nucleic acids are NOT DETECTED. The SARS-CoV-2 RNA is generally detectable in upper and lower respiratory specimens during the acute phase of infection. Negative results do not preclude SARS-CoV-2 infection, do not rule out co-infections with other pathogens, and should not be used as the sole basis for treatment or other patient management decisions. Negative results must be combined with clinical observations, patient history, and epidemiological information. The expected result is Negative. Fact Sheet for Patients: SugarRoll.be Fact Sheet for Healthcare Providers: https://www.woods-mathews.com/ This test is not yet approved or cleared by the [place] FDA and  has been authorized for detection and/or diagnosis of SARS-CoV-2 by FDA under an Emergency Use Authorization (EUA). This EUA will remain  in effect (meaning this test can be used) for the duration of the COVID-19 declaration under Section 56 4(b)(1) of the Act, 21 U.S.C. section  360bbb-3(b)(1), unless the authorization is terminated or revoked sooner. Performed at [facility], [place] [address], [place], [place] [address]   Culture, blood (routine x 2)     Status: None   Collection Time: 05/28/19  6:24 AM   Specimen: BLOOD LEFT WRIST  Result Value Ref Range Status   Specimen Description BLOOD LEFT WRIST  Final   Special Requests   Final    BOTTLES DRAWN AEROBIC AND ANAEROBIC Blood Culture results may not be optimal due to an inadequate volume of blood received in culture bottles   Culture   Final    NO GROWTH 5 DAYS Performed at [facility], [place] [address] [place], [place] [address]    Report Status 06/02/2019 FINAL  Final  Culture, blood (routine x 2)     Status: None   Collection Time: 05/28/19  6:40 AM   Specimen: BLOOD  Result Value Ref Range Status   Specimen Description BLOOD LEFT ANTECUBITAL  Final   Special Requests   Final    BOTTLES DRAWN AEROBIC AND ANAEROBIC Blood Culture adequate volume   Culture   Final    NO GROWTH 5 DAYS Performed at [facility], [place] [address] [place], [place] [address]    Report Status 06/02/2019 FINAL  Final  Urine Culture     Status: Abnormal   Collection Time: 05/28/19 11:17 AM   Specimen: Urine, Random  Result Value Ref Range Status   Specimen Description URINE, RANDOM  Final   Special Requests   Final    NONE Performed at [facility], [place] [address], [place], [place] [address]    Culture 30,000 COLONIES/mL ESCHERICHIA COLI (A)  Final   Report Status 05/30/2019 FINAL  Final   Organism ID, Bacteria ESCHERICHIA COLI (A)  Final      Susceptibility   Escherichia coli - MIC*    AMPICILLIN <=2 SENSITIVE Sensitive     CEFAZOLIN <=4 SENSITIVE Sensitive     CEFTRIAXONE <=1 SENSITIVE Sensitive     CIPROFLOXACIN <=0.25 SENSITIVE Sensitive     GENTAMICIN <=1 SENSITIVE Sensitive     IMIPENEM <=0.25 SENSITIVE Sensitive     NITROFURANTOIN <=16 SENSITIVE Sensitive     TRIMETH/SULFA <=20  SENSITIVE Sensitive     AMPICILLIN/SULBACTAM <=2 SENSITIVE Sensitive     PIP/TAZO <=4 SENSITIVE Sensitive     Extended ESBL NEGATIVE Sensitive     *  30,000 COLONIES/mL ESCHERICHIA COLI  SARS CORONAVIRUS 2 (TAT 6-24 HRS) Nasopharyngeal Nasopharyngeal Swab     Status: None   Collection Time: 06/01/19 10:46 PM   Specimen: Nasopharyngeal Swab  Result Value Ref Range Status   SARS Coronavirus 2 NEGATIVE NEGATIVE Final    Comment: (NOTE) SARS-CoV-2 target nucleic acids are NOT DETECTED. The SARS-CoV-2 RNA is generally detectable in upper and lower respiratory specimens during the acute phase of infection. Negative results do not preclude SARS-CoV-2 infection, do not rule out co-infections with other pathogens, and should not be used as the sole basis for treatment or other patient management decisions. Negative results must be combined with clinical observations, patient history, and epidemiological information. The expected result is Negative. Fact Sheet for Patients: SugarRoll.be Fact Sheet for Healthcare Providers: https://www.woods-mathews.com/ This test is not yet approved or cleared by the [place] FDA and  has been authorized for detection and/or diagnosis of SARS-CoV-2 by FDA under an Emergency Use Authorization (EUA). This EUA will remain  in effect (meaning this test can be used) for the duration of the COVID-19 declaration under Section 56 4(b)(1) of the Act, 21 U.S.C. section 360bbb-3(b)(1), unless the authorization is terminated or revoked sooner. Performed at [facility], [place] [address] [place], [place] [address]     Procedures and diagnostic studies:  No results found.  Medications:   . cefdinir  300 mg Oral BID  . Chlorhexidine Gluconate Cloth  6 each Topical Daily  . darunavir-cobicistat  1 tablet Oral QHS  . dolutegravir  50 mg Oral QHS   And  . rilpivirine  25 mg Oral QHS  . levothyroxine  200 mcg Oral  QAC breakfast  . liver oil-zinc oxide   Topical TID  . maraviroc  150 mg Oral BID   Continuous Infusions: . fluconazole (DIFLUCAN) IV 200 mg (06/02/19 1557)     LOS: 0 days   Domenic Polite  [facility]  06/03/2019, 11:26 AM

## 2019-06-03 NOTE — Plan of Care (Signed)
  Problem: Education: Goal: Knowledge of General Education information will improve Description Including pain rating scale, medication(s)/side effects and non-pharmacologic comfort measures Outcome: Progressing   

## 2019-06-03 NOTE — Progress Notes (Addendum)
Initial Nutrition Assessment  DOCUMENTATION CODES:   Non-severe (moderate) malnutrition in context of chronic illness  INTERVENTION:  -Ensure Enlive po TID, each supplement provides 350 kcal and 20 grams of protein -Recommend liberalizing diet to regular with chopped meats -MVI with minerals  NUTRITION DIAGNOSIS:   Moderate Malnutrition related to chronic illness(metastatic breast cancer) as evidenced by mild fat depletion, severe muscle depletion, mild muscle depletion, percent weight loss.  GOAL:   Patient will meet greater than or equal to 90% of their needs  MONITOR:   PO intake, Supplement acceptance, Labs, Weight trends  REASON FOR ASSESSMENT:   Consult Assessment of nutrition requirement/status  ASSESSMENT:   63 year old patient admitted with weakness and wound that could not be managed at home. PMH of metastatic breast cancer on chemo, HIV, HLD, HTN, AKI, anemia of chronic disease and CKD stage 3. Pt found to have moisture associated skin breakdown.   Pt was alert and sitting up in bed. Pt reported she is feeling well and eating well. Reported having eggs, bacon and toast for breakfast. Pt reported suspending her lunch order due to it being difficult to order meals having recently eaten. Encouraged pt to order meals even if they are small so she will get calories and protein. Pt reported typically has a breakfast of Equate chocolate shake, lunch of chicken noodle soup and dinner of chicken and greens. Pt denies chewing or swallowing issues. Stated she sometimes finds it too tiresome to swallow food.   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 Pt reports using cane to ambulate.   Encouraged pt to drink chocolate Ensure TID. Pt prefers Equate brand but will try to drink EE.   Pt reports having weighed 279# in 1/20. Pt current charted wt is 199#. This represents a 29% wt loss over 11 months which is considered severe.  Medications: omnicef, diflucan,  calcium carbonate 500 mg  Labs: Potassium 4.1, glucose 122, calcium 8.4, corrected calcium 9.84  NUTRITION - FOCUSED PHYSICAL EXAM:    Most Recent Value  Orbital Region  Mild depletion  Upper Arm Region  No depletion  Thoracic and Lumbar Region  No depletion  Buccal Region  Mild depletion  Temple Region  Moderate depletion  Clavicle Bone Region  Severe depletion [right side severe, left side mild]  Clavicle and Acromion Bone Region  Severe depletion [right side severe, left side mild]  Scapular Bone Region  Mild depletion  Dorsal Hand  Moderate depletion [right sided depletion]  Patellar Region  Unable to assess  Anterior Thigh Region  Unable to assess  Posterior Calf Region  Moderate depletion  Edema (RD Assessment)  Moderate  Hair  Unable to assess  Eyes  Reviewed  Mouth  Reviewed  Skin  Reviewed  Nails  Reviewed       Diet Order:   Diet Order            Diet renal with fluid restriction Fluid restriction: 1200 mL Fluid; Room service appropriate? Yes; Fluid consistency: Thin  Diet effective now              EDUCATION NEEDS:   Education needs have been addressed  Skin:  Skin Assessment: Reviewed RN Assessment(moisture associated skin breakdown of buttocks and groin)  Last BM:  12/5  Height:   Ht Readings from Last 1 Encounters:  06/01/19 5\' 7"  (1.702 m)    Weight:   Wt Readings from Last 1 Encounters:  06/02/19 90.5 kg  Ideal Body Weight:  61.4 kg  BMI:  Body mass index is 31.25 kg/m.  Estimated Nutritional Needs:   Kcal:  2000-2200  Protein:  100-110  Fluid:  > 2L    Allen Norris Dietetic Intern Pager # [phone]

## 2019-06-04 DIAGNOSIS — L304 Erythema intertrigo: Secondary | ICD-10-CM

## 2019-06-04 DIAGNOSIS — Z853 Personal history of malignant neoplasm of breast: Secondary | ICD-10-CM

## 2019-06-04 DIAGNOSIS — R339 Retention of urine, unspecified: Secondary | ICD-10-CM

## 2019-06-04 DIAGNOSIS — D61818 Other pancytopenia: Secondary | ICD-10-CM

## 2019-06-04 DIAGNOSIS — I959 Hypotension, unspecified: Secondary | ICD-10-CM

## 2019-06-04 DIAGNOSIS — I10 Essential (primary) hypertension: Secondary | ICD-10-CM

## 2019-06-04 DIAGNOSIS — B20 Human immunodeficiency virus [HIV] disease: Secondary | ICD-10-CM

## 2019-06-04 DIAGNOSIS — R239 Unspecified skin changes: Secondary | ICD-10-CM

## 2019-06-04 DIAGNOSIS — E039 Hypothyroidism, unspecified: Secondary | ICD-10-CM

## 2019-06-04 DIAGNOSIS — N189 Chronic kidney disease, unspecified: Secondary | ICD-10-CM

## 2019-06-04 LAB — HSV CULTURE AND TYPING

## 2019-06-04 LAB — CBC
HCT: 25.3 % — ABNORMAL LOW (ref 36.0–46.0)
Hemoglobin: 8.4 g/dL — ABNORMAL LOW (ref 12.0–15.0)
MCH: 31.5 pg (ref 26.0–34.0)
MCHC: 33.2 g/dL (ref 30.0–36.0)
MCV: 94.8 fL (ref 80.0–100.0)
Platelets: 80 10*3/uL — ABNORMAL LOW (ref 150–400)
RBC: 2.67 MIL/uL — ABNORMAL LOW (ref 3.87–5.11)
RDW: 18.4 % — ABNORMAL HIGH (ref 11.5–15.5)
WBC: 3.1 10*3/uL — ABNORMAL LOW (ref 4.0–10.5)
nRBC: 2.6 % — ABNORMAL HIGH (ref 0.0–0.2)

## 2019-06-04 LAB — BASIC METABOLIC PANEL
Anion gap: 9 (ref 5–15)
BUN: 21 mg/dL (ref 8–23)
CO2: 20 mmol/L — ABNORMAL LOW (ref 22–32)
Calcium: 8 mg/dL — ABNORMAL LOW (ref 8.9–10.3)
Chloride: 107 mmol/L (ref 98–111)
Creatinine, Ser: 1.28 mg/dL — ABNORMAL HIGH (ref 0.44–1.00)
GFR calc Af Amer: 52 mL/min — ABNORMAL LOW (ref >60)
GFR calc non Af Amer: 44 mL/min — ABNORMAL LOW (ref >60)
Glucose, Bld: 110 mg/dL — ABNORMAL HIGH (ref 70–99)
Potassium: 4 mmol/L (ref 3.5–5.1)
Sodium: 136 mmol/L (ref 135–145)

## 2019-06-04 MED ORDER — SODIUM CHLORIDE 0.9% FLUSH
10.0000 mL | INTRAVENOUS | Status: DC | PRN
  Administered 2019-06-10 – 2019-06-28 (×2): 10 mL

## 2019-06-04 NOTE — Progress Notes (Signed)
Physical Therapy Treatment Patient Details Name: Kayla Price MRN: [id number] DOB: July 21, 1955 Today's Date: 06/04/2019    History of Present Illness Kayla Price is a 63 y.o. female with medical history significant of metastatic breast cancer on chemotherapy, HIV disease, hyperlipidemia, chronic kidney disease stage II, hypertension, anemia of chronic disease, protein calorie malnutrition and s/p d/c home after 4 day admissinon for AKI and buttock wound.  She is admitted with continued weakness and with wound that could not be managed at home and urinary retention.    PT Comments    Pt agreeable to OOB activity this today. Pt requires mod assist for bed mobility and transfers, with very increased time to perform. Pt ambulated multiple laps in room with min guard assist for safety, which is improved assist level vs 2 days ago with PT. HRmax 124 bpm during ambulation, RN notified. PT continuing to recommend SNF, will continue to follow acutely.    Follow Up Recommendations  SNF     Equipment Recommendations  None recommended by PT    Recommendations for Other Services       Precautions / Restrictions Precautions Precautions: Fall Precaution Comments: watch HR Restrictions Weight Bearing Restrictions: No    Mobility  Bed Mobility Overal bed mobility: Needs Assistance Bed Mobility: Supine to Sit     Supine to sit: Mod assist;HOB elevated     General bed mobility comments: mod assist for LE lifting and translation to EOB, trunk elevation, and scooting to EOB with combination of leaning L and R and use of bed pad.  Transfers Overall transfer level: Needs assistance Equipment used: Rolling walker (2 wheeled) Transfers: Sit to/from Stand Sit to Stand: From elevated surface;Mod assist         General transfer comment: Mod assist for power up, anterior translation of trunk (nose over toes) to initiate stand, steadying. Very increased time to rise, verbal cuing for  safe hand placement when rising and sitting.  Ambulation/Gait Ambulation/Gait assistance: Min assist Gait Distance (Feet): 50 Feet Assistive device: Rolling walker (2 wheeled) Gait Pattern/deviations: Shuffle;Decreased stride length;Wide base of support;Step-through pattern Gait velocity: decr   General Gait Details: min guard for safety, pt with self-cuing for placement in RW and PT with verbal cuing for upright trunk in standing.   Stairs             Wheelchair Mobility    Modified Rankin (Stroke Patients Only)       Balance Overall balance assessment: Needs assistance Sitting-balance support: Feet supported Sitting balance-Leahy Scale: Fair     Standing balance support: Bilateral upper extremity supported Standing balance-Leahy Scale: Poor Standing balance comment: reliant on UE support                            Cognition Arousal/Alertness: Awake/alert Behavior During Therapy: WFL for tasks assessed/performed Overall Cognitive Status: Within Functional Limits for tasks assessed                                        Exercises General Exercises - Lower Extremity Long Arc Quad: AROM;Both;10 reps;Seated    General Comments General comments (skin integrity, edema, etc.): HRmax 124 bpm with ambulation      Pertinent Vitals/Pain Pain Assessment: 0-10 Pain Score: 5  Pain Location: wounds on buttock and perineal area, neuropathy RUE Pain Descriptors / Indicators: Grimacing;Sore;Discomfort Pain  Intervention(s): Limited activity within patient's tolerance;Monitored during session;Repositioned    Home Living                      Prior Function            PT Goals (current goals can now be found in the care plan section) Acute Rehab PT Goals Patient Stated Goal: to go to rehab to get help with wound and to work to get stronger PT Goal Formulation: With patient Time For Goal Achievement: 06/16/19 Potential to Achieve  Goals: Fair Progress towards PT goals: Progressing toward goals    Frequency    Min 2X/week      PT Plan Current plan remains appropriate    Co-evaluation              AM-PAC PT "6 Clicks" Mobility   Outcome Measure  Help needed turning from your back to your side while in a flat bed without using bedrails?: A Lot Help needed moving from lying on your back to sitting on the side of a flat bed without using bedrails?: A Lot Help needed moving to and from a bed to a chair (including a wheelchair)?: A Lot Help needed standing up from a chair using your arms (e.g., wheelchair or bedside chair)?: A Lot Help needed to walk in hospital room?: A Little Help needed climbing 3-5 steps with a railing? : Total 6 Click Score: 12    End of Session Equipment Utilized During Treatment: Gait belt Activity Tolerance: Patient tolerated treatment well Patient left: with call bell/phone within reach;in chair;with chair alarm set;with nursing/sitter in room Nurse Communication: Mobility status PT Visit Diagnosis: Other abnormalities of gait and mobility (R26.89);Muscle weakness (generalized) (M62.81);Pain Pain - part of body: (buttocks and perineum)     Time: 4098-1191 PT Time Calculation (min) (ACUTE ONLY): 30 min  Charges:  $Gait Training: 8-22 mins $Therapeutic Activity: 8-22 mins                     Crescent Gotham E, PT Acute Rehabilitation Services Pager [phone]  Office [phone]   Mack Alvidrez D Despina Hidden 06/04/2019, 3:54 PM

## 2019-06-04 NOTE — Plan of Care (Signed)
  Problem: Education: Goal: Knowledge of General Education information will improve Description Including pain rating scale, medication(s)/side effects and non-pharmacologic comfort measures Outcome: Progressing   

## 2019-06-04 NOTE — Consult Note (Signed)
[facility] CM Inpatient Consult   06/04/2019  Kayla Price 1955-09-20 [id number]   Referral: [facility] CMA-  EMMI Red flag from General Discharge; Medications, Wound Care  Chart reviewed and patient has been readmitted less than 7 days noted with 3 admissions in the past 6 months with a high risk for unplanned readmission.  Chart screened for [facility] [[facility] Care Management needs in [facility].  Chart reviewed Physical Therapy evaluation for disposition recommendations for a skilled nursing facility stay.  Patient was readmitted 06/01/2019.   Primary Care Provider: Abbey Chatters, MD with [facility].  Plan:  Will follow the inpatient Transition of Care [TOC] team for progress and disposition for needs.    If patient transition to a skilled facility, [facility] Care Management staff does not follow at the facility.  Of note, [facility] Care Management does not interfere with or replace any services arranged by the inpatient Transition Of Care team.  For questions, please contact:  Charlesetta Shanks, RN BSN CCM [facility] Liaison   Toll free office:  [phone] Hours: M-F 8:30 am -5 pm Fax number: [phone] [name]

## 2019-06-04 NOTE — Progress Notes (Addendum)
[facility]  PROGRESS NOTE  Kayla Price NWG:[id number] DOB: [birth year] DOA: 06/01/2019 PCP: Lauree Chandler, NP Admit date - 06/01/2019   Admitting Physician Elwyn Reach, MD  Outpatient Primary MD for the patient is Lauree Chandler, NP  LOS - 1 Brief Narrative   Kayla Price is a 63 y.o. year old female with medical history significant for breast cancer with metastasis to the brain and right pleura status post whole brain radiation, HIV,  CKD stage II, and recent mission from 12/2-12/6 for AKI, hypotension and superficial skin breakdown/ulcerations of the groin/perineum who presented on 06/01/2019 1 day after discharge with reports of worsening drainage from known skin ulcerations and generalized weakness.  In the ED findings notable for blood pressure 88/64, WBC 2.4, platelets 54, creatinine 1.75 (baseline 1.1), lactic acid within normal limits.  Patient was given IV fluids and admitted with working diagnosis of AKI, hypotension, and decubitus ulcerations of buttocks requiring aggressive wound care.  Hospital course complicated by superficial skin breakdown of groin/perineal area consistent with intertrigo/fungal infection with no signs of decubitus ulcers for which wound care saw recommended appropriate dressings, nystatin;  Subjective  Ms. Handley today reports improvement in pain control or skin breakdown in groin area.  Denies fevers, chills, cough, shortness of breath, abdominal pain, nausea or vomiting.  A & P  Superficial skin breakdown of groin/perennial area associated with moisture, improving.  Healing well with supportive care. Doubt herpes -Keeping this area clean/dry is key, currently has Foley for urinary retention -Continue fluconazole, add nystatin cream to area, follow HSV pCR  AKI on CKD, with urinary retention.  Creatinine improving with Foley in place.  Creatinine back at baseline.  Likely acute exacerbation in setting of brief hypotension and  continue use of Benicar/also discontinued on recent hospitalization -Avoid nephrotoxins -Daily BMP  Hypotension, improving.  BP range last 24 hours 99/66-127/87.  Decadron discontinued given random cortisol was 19, not consistent with adrenal insufficiency.  Patient previously on Decadron for taper related to malignancy. -Closely monitor BP off steroids -Recommend discontinuation of Benicar  Hypothyroidism, stable -Continue Synthroid  Physical deconditioning.  Worked with PT on 12/8 and again on 12/10.  Prior level of independence: Independent with assistive devices.  Pain from groin site previously limited ambulation, but much better today in pain control and healing of skin site.  In working with PT today they have noticed improvement in her strength and continue to believe patient would benefit from SNF level rehab upon D/C, social worker consulted to assist -encourage OOB to chair with assistance -PT recommends SNF, I agree SNF rehab would be beneficial  Given her prior level of independence and improvement in her pain that was previously limiting her --updated daughter on dispo  HIV, controlled.  Laxity for count greater than 400 on 10/26 -Continue darunavir -cobicistat, dolutegravir  Recent E. coli UTI.  Diagnosed on previous hospital course. -Currently on cefdinir, complete course.  Pancytopenia, likely chemotherapy associated.  Improving.  No signs or symptoms of bleeding -Monitor CBC, SCDs  Metastatic breast cancer, metastases to the brain(04/2019) and right pleural space(03/2018)   Status post whole brain radiation (11/5-11/18/20200 -Followed by Dr. Jana Hakim, plan for outpatient PET scan to determine next step in treatment   Family Communication  :  Spoke with daughter, updated on plan to consider SNF and that insurance would have to approve   Code Status :  DNR  Disposition Plan  :Medically stable. PT recommends SNF given need for moderate-max assistance for  bed mobility  and transfers.  Previous baseline ambulatory with occasional use of cane, pain better controlled today.   Consults  :  Dr. Jana Hakim, oncology  Procedures  :  none  DVT Prophylaxis  :   SCDs   Lab Results  Component Value Date   PLT 80 (L) 06/04/2019    Diet :  Diet Order            Diet regular Room service appropriate? Yes; Fluid consistency: Thin  Diet effective now               Inpatient Medications Scheduled Meds: . cefdinir  300 mg Oral BID  . Chlorhexidine Gluconate Cloth  6 each Topical Daily  . darunavir-cobicistat  1 tablet Oral QHS  . dolutegravir  50 mg Oral QHS   And  . rilpivirine  25 mg Oral QHS  . feeding supplement (ENSURE ENLIVE)  237 mL Oral TID BM  . levothyroxine  200 mcg Oral QAC breakfast  . liver oil-zinc oxide   Topical TID  . maraviroc  150 mg Oral BID  . multivitamin with minerals  1 tablet Oral Daily   Continuous Infusions: . fluconazole (DIFLUCAN) IV 200 mg (06/04/19 1403)   PRN Meds:.acetaminophen **OR** acetaminophen, calcium carbonate (dosed in mg elemental calcium), camphor-menthol **AND** hydrOXYzine, docusate sodium, HYDROcodone-acetaminophen, ondansetron **OR** ondansetron (ZOFRAN) IV, sodium chloride flush, sorbitol, zolpidem  Antibiotics  :   Anti-infectives (From admission, onward)   Start     Dose/Rate Route Frequency Ordered Stop   06/02/19 2200  darunavir-cobicistat (PREZCOBIX) 800-150 MG per tablet 1 tablet     1 tablet Oral Daily at bedtime 06/02/19 0950     06/02/19 2200  dolutegravir (TIVICAY) tablet 50 mg     50 mg Oral Daily at bedtime 06/02/19 1455     06/02/19 2200  rilpivirine (EDURANT) tablet 25 mg     25 mg Oral Daily at bedtime 06/02/19 1455     06/02/19 1700  dolutegravir (TIVICAY) tablet 50 mg  Status:  Discontinued     50 mg Oral Daily with supper 06/02/19 1453 06/02/19 1456   06/02/19 1700  rilpivirine (EDURANT) tablet 25 mg  Status:  Discontinued     25 mg Oral Daily with supper 06/02/19 1453 06/02/19  1456   06/02/19 1415  fluconazole (DIFLUCAN) IVPB 200 mg     200 mg 100 mL/hr over 60 Minutes Intravenous Every 24 hours 06/02/19 1402     06/02/19 1100  maraviroc (SELZENTRY) tablet 150 mg     150 mg Oral 2 times daily 06/02/19 0950     06/02/19 1000  cefdinir (OMNICEF) capsule 300 mg     300 mg Oral 2 times daily 06/02/19 0950 06/05/19 0959   06/02/19 1000  fluconazole (DIFLUCAN) tablet 400 mg  Status:  Discontinued     400 mg Oral Daily 06/02/19 0950 06/02/19 1402       Objective   Vitals:   06/03/19 1701 06/03/19 2100 06/04/19 0554 06/04/19 0842  BP: 127/87 104/73 102/88 108/66  Pulse: 72 79 77 81  Resp: 18 18 16 18   Temp: 98.7 F (37.1 C) 97.9 F (36.6 C) 98.8 F (37.1 C) 98.7 F (37.1 C)  TempSrc: Oral Oral Oral Oral  SpO2: 98% 99% 98% 100%  Weight:      Height:        SpO2: 100 %  Wt Readings from Last 3 Encounters:  06/02/19 90.5 kg  05/31/19 90.7 kg  05/19/19 88 kg  Intake/Output Summary (Last 24 hours) at 06/04/2019 1404 Last data filed at 06/04/2019 1300 Gross per 24 hour  Intake 1377 ml  Output 900 ml  Net 477 ml    Physical Exam:  Awake Alert, Oriented X 3, Normal affect No new F.N deficits,  Bray.AT, No JVD Symmetrical Chest wall movement, Good air movement bilaterally on room air, CTAB RRR,No Gallops,Rubs or new Murmurs,  +ve B.Sounds, Abd Soft, No tenderness, No organomegaly appreciated, No rebound, guarding or rigidity. No Cyanosis, Clubbing or edema, No new Rash or bruise   Groin area       I have personally reviewed the following:   Data Reviewed:  CBC Recent Labs  Lab 05/31/19 0500 06/01/19 1825 06/01/19 2319 06/02/19 0525 06/04/19 0830  WBC 1.8* 2.5* 2.4* 2.5* 3.1*  HGB 8.7* 8.7* 7.9* 8.2* 8.4*  HCT 26.5* 26.5* 24.5* 25.2* 25.3*  PLT 54* 65* 61* 67* 80*  MCV 95.3 95.3 97.6 96.2 94.8  MCH 31.3 31.3 31.5 31.3 31.5  MCHC 32.8 32.8 32.2 32.5 33.2  RDW 18.1* 18.1* 18.2* 18.5* 18.4*  LYMPHSABS  --  0.5*  --   --    --   MONOABS  --  0.2  --   --   --   EOSABS  --  0.0  --   --   --   BASOSABS  --  0.0  --   --   --     Chemistries  Recent Labs  Lab 05/30/19 0515 05/31/19 0500 06/01/19 1825 06/01/19 2319 06/02/19 0525 06/04/19 0830  NA 147* 142 139  --  142 136  K 4.1 4.2 4.8  --  4.1 4.0  CL 120* 114* 113*  --  112* 107  CO2 20* 21* 20*  --  20* 20*  GLUCOSE 118* 95 129*  --  122* 110*  BUN 50* 36* 39*  --  35* 21  CREATININE 1.15* 1.19* 1.75* 1.68* 1.61* 1.28*  CALCIUM 8.8* 8.6* 8.8*  --  8.4* 8.0*  AST 22 22 18   --  20  --   ALT 37 36 31  --  28  --   ALKPHOS 52 45 42  --  42  --   BILITOT 0.6 0.5 0.2*  --  0.4  --    ------------------------------------------------------------------------------------------------------------------ No results for input(s): CHOL, HDL, LDLCALC, TRIG, CHOLHDL, LDLDIRECT in the last 72 hours.  Lab Results  Component Value Date   HGBA1C 6.1 03/07/2019   ------------------------------------------------------------------------------------------------------------------ No results for input(s): TSH, T4TOTAL, T3FREE, THYROIDAB in the last 72 hours.  Invalid input(s): FREET3 ------------------------------------------------------------------------------------------------------------------ No results for input(s): VITAMINB12, FOLATE, FERRITIN, TIBC, IRON, RETICCTPCT in the last 72 hours.  Coagulation profile No results for input(s): INR, PROTIME in the last 168 hours.  No results for input(s): DDIMER in the last 72 hours.  Cardiac Enzymes No results for input(s): CKMB, TROPONINI, MYOGLOBIN in the last 168 hours.  Invalid input(s): CK ------------------------------------------------------------------------------------------------------------------ No results found for: BNP  Micro Results Recent Results (from the past 240 hour(s))  SARS CORONAVIRUS 2 (TAT 6-24 HRS) Nasopharyngeal Nasopharyngeal Swab     Status: None   Collection Time: 05/28/19  5:07  AM   Specimen: Nasopharyngeal Swab  Result Value Ref Range Status   SARS Coronavirus 2 NEGATIVE NEGATIVE Final    Comment: (NOTE) SARS-CoV-2 target nucleic acids are NOT DETECTED. The SARS-CoV-2 RNA is generally detectable in upper and lower respiratory specimens during the acute phase of infection. Negative results do not preclude SARS-CoV-2 infection,  do not rule out co-infections with other pathogens, and should not be used as the sole basis for treatment or other patient management decisions. Negative results must be combined with clinical observations, patient history, and epidemiological information. The expected result is Negative. Fact Sheet for Patients: SugarRoll.be Fact Sheet for Healthcare Providers: https://www.woods-mathews.com/ This test is not yet approved or cleared by the [place] FDA and  has been authorized for detection and/or diagnosis of SARS-CoV-2 by FDA under an Emergency Use Authorization (EUA). This EUA will remain  in effect (meaning this test can be used) for the duration of the COVID-19 declaration under Section 56 4(b)(1) of the Act, 21 U.S.C. section 360bbb-3(b)(1), unless the authorization is terminated or revoked sooner. Performed at [facility], [place] [address], [name], [place] [address]   Culture, blood (routine x 2)     Status: None   Collection Time: 05/28/19  6:24 AM   Specimen: BLOOD LEFT WRIST  Result Value Ref Range Status   Specimen Description BLOOD LEFT WRIST  Final   Special Requests   Final    BOTTLES DRAWN AEROBIC AND ANAEROBIC Blood Culture results may not be optimal due to an inadequate volume of blood received in culture bottles   Culture   Final    NO GROWTH 5 DAYS Performed at [facility], [place] [address] [place], [place] [address]    Report Status 06/02/2019 FINAL  Final  Culture, blood (routine x 2)     Status: None   Collection Time: 05/28/19  6:40 AM    Specimen: BLOOD  Result Value Ref Range Status   Specimen Description BLOOD LEFT ANTECUBITAL  Final   Special Requests   Final    BOTTLES DRAWN AEROBIC AND ANAEROBIC Blood Culture adequate volume   Culture   Final    NO GROWTH 5 DAYS Performed at [facility], [place] [address] [place], [place] [address]    Report Status 06/02/2019 FINAL  Final  Urine Culture     Status: Abnormal   Collection Time: 05/28/19 11:17 AM   Specimen: Urine, Random  Result Value Ref Range Status   Specimen Description URINE, RANDOM  Final   Special Requests   Final    NONE Performed at [facility], [place] [address], [place], [place] [address]    Culture 30,000 COLONIES/mL ESCHERICHIA COLI (A)  Final   Report Status 05/30/2019 FINAL  Final   Organism ID, Bacteria ESCHERICHIA COLI (A)  Final      Susceptibility   Escherichia coli - MIC*    AMPICILLIN <=2 SENSITIVE Sensitive     CEFAZOLIN <=4 SENSITIVE Sensitive     CEFTRIAXONE <=1 SENSITIVE Sensitive     CIPROFLOXACIN <=0.25 SENSITIVE Sensitive     GENTAMICIN <=1 SENSITIVE Sensitive     IMIPENEM <=0.25 SENSITIVE Sensitive     NITROFURANTOIN <=16 SENSITIVE Sensitive     TRIMETH/SULFA <=20 SENSITIVE Sensitive     AMPICILLIN/SULBACTAM <=2 SENSITIVE Sensitive     PIP/TAZO <=4 SENSITIVE Sensitive     Extended ESBL NEGATIVE Sensitive     * 30,000 COLONIES/mL ESCHERICHIA COLI  SARS CORONAVIRUS 2 (TAT 6-24 HRS) Nasopharyngeal Nasopharyngeal Swab     Status: None   Collection Time: 06/01/19 10:46 PM   Specimen: Nasopharyngeal Swab  Result Value Ref Range Status   SARS Coronavirus 2 NEGATIVE NEGATIVE Final    Comment: (NOTE) SARS-CoV-2 target nucleic acids are NOT DETECTED. The SARS-CoV-2 RNA is generally detectable in upper and  lower respiratory specimens during the acute phase of infection. Negative results do not preclude SARS-CoV-2 infection, do not rule out co-infections with other pathogens, and should not be used as the sole basis for  treatment or other patient management decisions. Negative results must be combined with clinical observations, patient history, and epidemiological information. The expected result is Negative. Fact Sheet for Patients: SugarRoll.be Fact Sheet for Healthcare Providers: https://www.woods-mathews.com/ This test is not yet approved or cleared by the [place] FDA and  has been authorized for detection and/or diagnosis of SARS-CoV-2 by FDA under an Emergency Use Authorization (EUA). This EUA will remain  in effect (meaning this test can be used) for the duration of the COVID-19 declaration under Section 56 4(b)(1) of the Act, 21 U.S.C. section 360bbb-3(b)(1), unless the authorization is terminated or revoked sooner. Performed at [facility], [place] [address], [place],  [address]     Radiology Reports US RENAL  Result Date: 05/28/2019 CLINICAL DATA:  Acute kidney injury. EXAM: RENAL / URINARY TRACT ULTRASOUND COMPLETE COMPARISON:  PET CT scan 03/11/2019. FINDINGS: Right Kidney: Renal measurements: 9.9 x 4.3 x 5.0 cm = volume: 109.7 mL . Echogenicity within normal limits. No mass or hydronephrosis visualized. Left Kidney: Renal measurements: 9.6 x 5.4 x 4.9 cm = volume: Is 134.3. mL. Echogenicity within normal limits. No mass or hydronephrosis visualized. Bladder: Appears normal for degree of bladder distention. Other: Small right pleural effusion and gallstones are noted as seen on the prior exam. IMPRESSION: Negative for hydronephrosis.  Normal appearing kidneys. Right pleural effusion. Gallstones. Electronically Signed   By: Inge Rise M.D.   On: 05/28/2019 07:15     Time Spent in minutes  30     Desiree Hane M.D on 06/04/2019 at 2:04 PM  To page go to [email or web address] - password [facility]

## 2019-06-05 ENCOUNTER — Inpatient Hospital Stay: Payer: Medicare HMO

## 2019-06-05 ENCOUNTER — Inpatient Hospital Stay (HOSPITAL_BASED_OUTPATIENT_CLINIC_OR_DEPARTMENT_OTHER): Payer: Medicare HMO | Admitting: Oncology

## 2019-06-05 ENCOUNTER — Ambulatory Visit: Payer: Medicare HMO

## 2019-06-05 DIAGNOSIS — Z171 Estrogen receptor negative status [ER-]: Secondary | ICD-10-CM

## 2019-06-05 DIAGNOSIS — C7931 Secondary malignant neoplasm of brain: Secondary | ICD-10-CM

## 2019-06-05 DIAGNOSIS — B009 Herpesviral infection, unspecified: Secondary | ICD-10-CM

## 2019-06-05 DIAGNOSIS — A6009 Herpesviral infection of other urogenital tract: Secondary | ICD-10-CM

## 2019-06-05 DIAGNOSIS — C50411 Malignant neoplasm of upper-outer quadrant of right female breast: Secondary | ICD-10-CM

## 2019-06-05 LAB — CBC
HCT: 24.4 % — ABNORMAL LOW (ref 36.0–46.0)
Hemoglobin: 8.1 g/dL — ABNORMAL LOW (ref 12.0–15.0)
MCH: 31.2 pg (ref 26.0–34.0)
MCHC: 33.2 g/dL (ref 30.0–36.0)
MCV: 93.8 fL (ref 80.0–100.0)
Platelets: 79 10*3/uL — ABNORMAL LOW (ref 150–400)
RBC: 2.6 MIL/uL — ABNORMAL LOW (ref 3.87–5.11)
RDW: 18.7 % — ABNORMAL HIGH (ref 11.5–15.5)
WBC: 3.2 10*3/uL — ABNORMAL LOW (ref 4.0–10.5)
nRBC: 4.4 % — ABNORMAL HIGH (ref 0.0–0.2)

## 2019-06-05 LAB — BASIC METABOLIC PANEL
Anion gap: 7 (ref 5–15)
BUN: 16 mg/dL (ref 8–23)
CO2: 22 mmol/L (ref 22–32)
Calcium: 8.1 mg/dL — ABNORMAL LOW (ref 8.9–10.3)
Chloride: 103 mmol/L (ref 98–111)
Creatinine, Ser: 1.17 mg/dL — ABNORMAL HIGH (ref 0.44–1.00)
GFR calc Af Amer: 57 mL/min — ABNORMAL LOW (ref >60)
GFR calc non Af Amer: 50 mL/min — ABNORMAL LOW (ref >60)
Glucose, Bld: 100 mg/dL — ABNORMAL HIGH (ref 70–99)
Potassium: 4 mmol/L (ref 3.5–5.1)
Sodium: 132 mmol/L — ABNORMAL LOW (ref 135–145)

## 2019-06-05 MED ORDER — VALACYCLOVIR HCL 500 MG PO TABS
1000.0000 mg | ORAL_TABLET | Freq: Two times a day (BID) | ORAL | Status: AC
  Administered 2019-06-05 – 2019-06-12 (×14): 1000 mg via ORAL
  Filled 2019-06-05 (×15): qty 2

## 2019-06-05 MED ORDER — VALACYCLOVIR HCL 500 MG PO TABS
1000.0000 mg | ORAL_TABLET | Freq: Three times a day (TID) | ORAL | Status: DC
  Administered 2019-06-05: 1000 mg via ORAL
  Filled 2019-06-05: qty 2

## 2019-06-05 NOTE — Consult Note (Addendum)
[facility] Nurse wound follow up Refer to previous consult note on 12/8; requested to reassess wounds in person.   Bilat buttocks with 3 areas of patchy partial thickness skin tears; red and moist. These are NOT pressure injuries and appearance is consistent with moisture and shear injuries.  Wound type: inner groin with patchy areas of full thickness wounds; appearance is consistent with moisture associated skin damage and probable candidias.  Multiple yellow, round, "punched out lesions", mod amt yellow drainage with strong foul odor.  Largest area is located near labia and appearance is consistent with moisture and constant pressure from legs rubbing together; it is not possible to reduce pressure to this site.  Approx 4X4X.1cm, slough with mod amt drainage.   Dressing procedure/placement/frequency: Pt has a foley to contain urine and promote healing.  She is on an air mattress to decrease pressure and increase airflow.  Desitin and antifungal powder was previously ordered to protect and promote drying and healing but patient states the Desitin "burned her skin " and she was unable to tolerate use.  Continue antifungal powder and add Aquacel to largest wound near labia to absorb drainage and provide antimicrobial benefits. Foam dresing to protect buttock wounds and promote healing. Please re-consult if further assistance is needed.  Thank-you,  Julien Girt MSN, [place], [place], [place], [place]

## 2019-06-05 NOTE — Progress Notes (Addendum)
[facility] Infectious [facility]    Date of Admission:  06/01/2019      ID: Kayla Price is a 63 y.o. female with  Well controlled HIV disease, admitted for AKI and subsequently developed HSV labial ulcers + cx positive   Subjective: Afebrile, feeling better, but soreness to labial that has started over the last 3-4 days. Not known to have this outbreak in the past.  Principal Problem:   AKI (acute kidney injury) ([place]) Active Problems:   Human immunodeficiency virus (HIV) disease ([place])   HYPERLIPIDEMIA, MIXED   HTN (hypertension)   PVD   Alopecia areata   BREAST CANCER, HX OF   Hypothyroidism   Hypotension   Acute kidney injury superimposed on CKD ([place])   Alteration in skin integrity due to moisture   DNR (do not resuscitate)   Malnutrition of moderate degree   Intertrigo   Pancytopenia (H[facility])   HSV-2 infection  Social History   Tobacco Use  . Smoking status: Never Smoker  . Smokeless tobacco: Never Used  Substance Use Topics  . Alcohol use: No  . Drug use: No  family history includes Arthritis in her father; COPD in her sister; Cancer in her cousin; Diabetes in her mother and sister; Heart attack in her brother; Heart disease in her father; Heart failure in her father; Kidney disease in her mother; Liver disease in her sister; Lung cancer in her paternal uncle; Non-Hodgkin's lymphoma (age of onset: 82) in her cousin; Sarcoidosis in her sister; Stroke in her brother, mother, and sister.   Medications:  . Chlorhexidine Gluconate Cloth  6 each Topical Daily  . darunavir-cobicistat  1 tablet Oral QHS  . dolutegravir  50 mg Oral QHS   And  . rilpivirine  25 mg Oral QHS  . feeding supplement (ENSURE ENLIVE)  237 mL Oral TID BM  . levothyroxine  200 mcg Oral QAC breakfast  . maraviroc  150 mg Oral BID  . multivitamin with minerals  1 tablet Oral Daily  . valACYclovir  1,000 mg Oral BID    Objective: Vital signs in last 24 hours: Temp:  [98.7 F (37.1  C)-99.5 F (37.5 C)] 99.2 F (37.3 C) (12/11 1600) Pulse Rate:  [84-90] 90 (12/11 1600) Resp:  [18-20] 20 (12/11 1600) BP: (99-108)/(68-74) 108/74 (12/11 1600) SpO2:  [97 %-98 %] 98 % (12/11 1600) Physical Exam  Constitutional:  oriented to person, place, and time. appears well-developed and well-nourished. No distress.  HENT: [place]/AT, PERRLA, no scleral icterus Mouth/Throat: Oropharynx is clear and moist. No oropharyngeal exudate.  Cardiovascular: Normal rate, regular rhythm and normal heart sounds. Exam reveals no gallop and no friction rub.  No murmur heard.  Pulmonary/Chest: Effort normal and breath sounds normal. No respiratory distress.  has no wheezes.  Neck = supple, no nuchal rigidity Abdominal: Soft. Bowel sounds are normal.  exhibits no distension. There is no tenderness.  GU= shallow ulcers to labial and inner thigh, kissing lesions Neurological: alert and oriented to person, place, and time.  Skin: Skin is warm and dry. No rash noted. No erythema.  Psychiatric: a normal mood and affect.  behavior is normal.    Lab Results Recent Labs    06/04/19 0830 06/05/19 0840  WBC 3.1* 3.2*  HGB 8.4* 8.1*  HCT 25.3* 24.4*  NA 136 132*  K 4.0 4.0  CL 107 103  CO2 20* 22  BUN 21 16  CREATININE 1.28* 1.17*    Microbiology: reviewed Studies/Results: No results  found.   Assessment/Plan: HIV disease = well controlled but treatment experienced. continue on tivicay/prezcobix/rilipivirine with maravoric BID, to take with food  HSV genital ulcers = can treat with valtrex 1gm BID x 7days  aki = appears improved and now back to baseline. No need to adjust meds  Will have her follow up in the ID clinic in [place] for Infectious Diseases Cell: [phone] Pager: [phone]  06/05/2019, 6:11 PM

## 2019-06-05 NOTE — Plan of Care (Signed)
  Problem: Education: Goal: Knowledge of General Education information will improve Description Including pain rating scale, medication(s)/side effects and non-pharmacologic comfort measures Outcome: Progressing   

## 2019-06-05 NOTE — Progress Notes (Signed)
Patient admitted, visit cancelled

## 2019-06-05 NOTE — Care Management Important Message (Signed)
Important Message  Patient Details  Name: Kayla Price MRN: [id number] Date of Birth: 1956-03-22   Medicare Important Message Given:  Yes     Orbie Pyo 06/05/2019, 2:39 PM

## 2019-06-05 NOTE — Progress Notes (Signed)
[facility]  PROGRESS NOTE  Kayla Price ASN:[id number] DOB: [birth year] DOA: 06/01/2019 PCP: Lauree Chandler, NP Admit date - 06/01/2019   Admitting Physician Elwyn Reach, MD  Outpatient Primary MD for the patient is Lauree Chandler, NP  LOS - 2 Brief Narrative   Kayla Price is a 63 y.o. year old female with medical history significant for breast cancer with metastasis to the brain and right pleura status post whole brain radiation, HIV,  CKD stage II, and recent mission from 12/2-12/6 for AKI, hypotension and superficial skin breakdown/ulcerations of the groin/perineum who presented on 06/01/2019 1 day after discharge with reports of worsening drainage from known skin ulcerations and generalized weakness.  In the ED findings notable for blood pressure 88/64, WBC 2.4, platelets 54, creatinine 1.75 (baseline 1.1), lactic acid within normal limits.  Patient was given IV fluids and admitted with working diagnosis of AKI, hypotension, and decubitus ulcerations of buttocks requiring aggressive wound care.  Hospital course complicated by superficial skin breakdown of groin/perineal area consistent with intertrigo/fungal infection with no signs of decubitus ulcers for which wound care saw recommended appropriate dressings, nystatin;  Subjective  Ms. Rallis today reports continued improvement in pain control of skin breakdown in groin area.  Denies fevers, chills, cough, shortness of breath, abdominal pain, nausea or vomiting.  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 Lesions look less raw and patient reports much better in terms of pain. HSV-2 PCR of skin returned +  --Start Valtrex 1 g TID PO, d/c fluconazole, Discussed with ID consultants who will come see her -Keeping this area clean/dry is key, currently has Foley for urinary retention  AKI on CKD, with urinary retention.  Creatinine improving with Foley in place.   Creatinine back at baseline.  Likely acute exacerbation in setting of brief hypotension and continue use of Benicar/also discontinued on recent hospitalization -Avoid nephrotoxins -Daily BMP  Hypotension, improving.  BP range last 24 hours 99/68-121/76.  Decadron discontinued given random cortisol was 19, not consistent with adrenal insufficiency.  Patient previously on Decadron for taper related to malignancy. -Closely monitor BP off steroids -Recommend discontinuation of Benicar on discharge  Hypothyroidism, stable -Continue Synthroid  Physical deconditioning.  Worked with PT on 12/8 and again on 12/10.  Prior level of independence: Independent with assistive devices.  Pain from groin site previously limited ambulation, but much better today in pain control and healing of skin site.  In working with PT today they have noticed improvement in her strength and continue to believe patient would benefit from SNF level rehab upon D/C, social worker consulted to assist -encourage OOB to chair with assistance -PT recommends SNF, I agree SNF rehab would be beneficial  Given her prior level of independence and improvement in her pain that was previously limiting her --updated daughter on dispo on 12/10  HIV, controlled.  CD4 greater than 400 on 10/26 -Continue darunavir -cobicistat, dolutegravir  Recent E. coli UTI.  Diagnosed on previous hospital course. -Completed cefdinir course.  Pancytopenia, likely chemotherapy associated.  Improving.  No signs or symptoms of bleeding -Monitor CBC, SCDs  Metastatic breast cancer, metastases to the brain(04/2019) and right pleural space(03/2018)   Status post whole brain radiation (11/5-11/18/2020 -Followed by Dr. Jana Hakim, plan for outpatient PET scan to determine next step in treatment   Family Communication  :  Spoke with daughter on 12/11, updated on plan to consider SNF, will update today  Code Status :  DNR  Disposition Plan  :Medically stable. PT  recommends SNF given need for moderate-max assistance for bed mobility and transfers.  Previous baseline ambulatory with occasional use of cane, pain better controlled today.   Consults  :  Dr. Jana Hakim, oncology, ID  Procedures  :  none  DVT Prophylaxis  :   SCDs   Lab Results  Component Value Date   PLT 80 (L) 06/04/2019    Diet :  Diet Order            Diet regular Room service appropriate? Yes; Fluid consistency: Thin  Diet effective now               Inpatient Medications Scheduled Meds: . Chlorhexidine Gluconate Cloth  6 each Topical Daily  . darunavir-cobicistat  1 tablet Oral QHS  . dolutegravir  50 mg Oral QHS   And  . rilpivirine  25 mg Oral QHS  . feeding supplement (ENSURE ENLIVE)  237 mL Oral TID BM  . levothyroxine  200 mcg Oral QAC breakfast  . liver oil-zinc oxide   Topical TID  . maraviroc  150 mg Oral BID  . multivitamin with minerals  1 tablet Oral Daily   Continuous Infusions: . fluconazole (DIFLUCAN) IV 200 mg (06/04/19 1403)   PRN Meds:.acetaminophen **OR** acetaminophen, calcium carbonate (dosed in mg elemental calcium), camphor-menthol **AND** hydrOXYzine, docusate sodium, HYDROcodone-acetaminophen, ondansetron **OR** ondansetron (ZOFRAN) IV, sodium chloride flush, sorbitol, zolpidem  Antibiotics  :   Anti-infectives (From admission, onward)   Start     Dose/Rate Route Frequency Ordered Stop   06/02/19 2200  darunavir-cobicistat (PREZCOBIX) 800-150 MG per tablet 1 tablet     1 tablet Oral Daily at bedtime 06/02/19 0950     06/02/19 2200  dolutegravir (TIVICAY) tablet 50 mg     50 mg Oral Daily at bedtime 06/02/19 1455     06/02/19 2200  rilpivirine (EDURANT) tablet 25 mg     25 mg Oral Daily at bedtime 06/02/19 1455     06/02/19 1700  dolutegravir (TIVICAY) tablet 50 mg  Status:  Discontinued     50 mg Oral Daily with supper 06/02/19 1453 06/02/19 1456   06/02/19 1700  rilpivirine (EDURANT) tablet 25 mg  Status:  Discontinued     25 mg Oral  Daily with supper 06/02/19 1453 06/02/19 1456   06/02/19 1415  fluconazole (DIFLUCAN) IVPB 200 mg     200 mg 100 mL/hr over 60 Minutes Intravenous Every 24 hours 06/02/19 1402     06/02/19 1100  maraviroc (SELZENTRY) tablet 150 mg     150 mg Oral 2 times daily 06/02/19 0950     06/02/19 1000  cefdinir (OMNICEF) capsule 300 mg     300 mg Oral 2 times daily 06/02/19 0950 06/04/19 2207   06/02/19 1000  fluconazole (DIFLUCAN) tablet 400 mg  Status:  Discontinued     400 mg Oral Daily 06/02/19 0950 06/02/19 1402       Objective   Vitals:   06/04/19 1731 06/04/19 2134 06/05/19 0622 06/05/19 0820  BP: 121/76 101/69 99/68 106/70  Pulse: 86 87 84 87  Resp: 18 20 20 18   Temp: 97.7 F (36.5 C) 98.7 F (37.1 C) 99.5 F (37.5 C) 98.7 F (37.1 C)  TempSrc: Oral Oral Oral Oral  SpO2: 97% 97% 97% 98%  Weight:      Height:        SpO2: 98 %  Wt Readings from Last 3 Encounters:  06/02/19 90.5  kg  05/31/19 90.7 kg  05/19/19 88 kg     Intake/Output Summary (Last 24 hours) at 06/05/2019 0831 Last data filed at 06/05/2019 0800 Gross per 24 hour  Intake 1353.16 ml  Output 1450 ml  Net -96.84 ml    Physical Exam:  Awake Alert, Oriented X 3, Normal affect No new F.N deficits,  [place].AT, Symmetrical Chest wall movement Groin area           I have personally reviewed the following:   Data Reviewed:  CBC Recent Labs  Lab 05/31/19 0500 06/01/19 1825 06/01/19 2319 06/02/19 0525 06/04/19 0830  WBC 1.8* 2.5* 2.4* 2.5* 3.1*  HGB 8.7* 8.7* 7.9* 8.2* 8.4*  HCT 26.5* 26.5* 24.5* 25.2* 25.3*  PLT 54* 65* 61* 67* 80*  MCV 95.3 95.3 97.6 96.2 94.8  MCH 31.3 31.3 31.5 31.3 31.5  MCHC 32.8 32.8 32.2 32.5 33.2  RDW 18.1* 18.1* 18.2* 18.5* 18.4*  LYMPHSABS  --  0.5*  --   --   --   MONOABS  --  0.2  --   --   --   EOSABS  --  0.0  --   --   --   BASOSABS  --  0.0  --   --   --     Chemistries  Recent Labs  Lab 05/30/19 0515 05/31/19 0500 06/01/19 1825 06/01/19 2319  06/02/19 0525 06/04/19 0830  NA 147* 142 139  --  142 136  K 4.1 4.2 4.8  --  4.1 4.0  CL 120* 114* 113*  --  112* 107  CO2 20* 21* 20*  --  20* 20*  GLUCOSE 118* 95 129*  --  122* 110*  BUN 50* 36* 39*  --  35* 21  CREATININE 1.15* 1.19* 1.75* 1.68* 1.61* 1.28*  CALCIUM 8.8* 8.6* 8.8*  --  8.4* 8.0*  AST 22 22 18   --  20  --   ALT 37 36 31  --  28  --   ALKPHOS 52 45 42  --  42  --   BILITOT 0.6 0.5 0.2*  --  0.4  --    ------------------------------------------------------------------------------------------------------------------ No results for input(s): CHOL, HDL, LDLCALC, TRIG, CHOLHDL, LDLDIRECT in the last 72 hours.  Lab Results  Component Value Date   HGBA1C 6.1 03/07/2019   ------------------------------------------------------------------------------------------------------------------ No results for input(s): TSH, T4TOTAL, T3FREE, THYROIDAB in the last 72 hours.  Invalid input(s): FREET3 ------------------------------------------------------------------------------------------------------------------ No results for input(s): VITAMINB12, FOLATE, FERRITIN, TIBC, IRON, RETICCTPCT in the last 72 hours.  Coagulation profile No results for input(s): INR, PROTIME in the last 168 hours.  No results for input(s): DDIMER in the last 72 hours.  Cardiac Enzymes No results for input(s): CKMB, TROPONINI, MYOGLOBIN in the last 168 hours.  Invalid input(s): CK ------------------------------------------------------------------------------------------------------------------ No results found for: BNP  Micro Results Recent Results (from the past 240 hour(s))  SARS CORONAVIRUS 2 (TAT 6-24 HRS) Nasopharyngeal Nasopharyngeal Swab     Status: None   Collection Time: 05/28/19  5:07 AM   Specimen: Nasopharyngeal Swab  Result Value Ref Range Status   SARS Coronavirus 2 NEGATIVE NEGATIVE Final    Comment: (NOTE) SARS-CoV-2 target nucleic acids are NOT DETECTED. The SARS-CoV-2 RNA  is generally detectable in upper and lower respiratory specimens during the acute phase of infection. Negative results do not preclude SARS-CoV-2 infection, do not rule out co-infections with other pathogens, and should not be used as the sole basis for treatment or other patient management decisions. Negative  results must be combined with clinical observations, patient history, and epidemiological information. The expected result is Negative. Fact Sheet for Patients: SugarRoll.be Fact Sheet for Healthcare Providers: https://www.woods-mathews.com/ This test is not yet approved or cleared by the [place] FDA and  has been authorized for detection and/or diagnosis of SARS-CoV-2 by FDA under an Emergency Use Authorization (EUA). This EUA will remain  in effect (meaning this test can be used) for the duration of the COVID-19 declaration under Section 56 4(b)(1) of the Act, 21 U.S.C. section 360bbb-3(b)(1), unless the authorization is terminated or revoked sooner. Performed at [facility], [place] [address], [place], [place] [address]   Culture, blood (routine x 2)     Status: None   Collection Time: 05/28/19  6:24 AM   Specimen: BLOOD LEFT WRIST  Result Value Ref Range Status   Specimen Description BLOOD LEFT WRIST  Final   Special Requests   Final    BOTTLES DRAWN AEROBIC AND ANAEROBIC Blood Culture results may not be optimal due to an inadequate volume of blood received in culture bottles   Culture   Final    NO GROWTH 5 DAYS Performed at [facility], [place] [address] [place], [place] [address]    Report Status 06/02/2019 FINAL  Final  Culture, blood (routine x 2)     Status: None   Collection Time: 05/28/19  6:40 AM   Specimen: BLOOD  Result Value Ref Range Status   Specimen Description BLOOD LEFT ANTECUBITAL  Final   Special Requests   Final    BOTTLES DRAWN AEROBIC AND ANAEROBIC Blood Culture adequate volume   Culture    Final    NO GROWTH 5 DAYS Performed at [facility], [place] [address], [place], [place] [address]    Report Status 06/02/2019 FINAL  Final  Urine Culture     Status: Abnormal   Collection Time: 05/28/19 11:17 AM   Specimen: Urine, Random  Result Value Ref Range Status   Specimen Description URINE, RANDOM  Final   Special Requests   Final    NONE Performed at [facility], [place] [address] [place], [place] [address]    Culture 30,000 COLONIES/mL ESCHERICHIA COLI (A)  Final   Report Status 05/30/2019 FINAL  Final   Organism ID, Bacteria ESCHERICHIA COLI (A)  Final      Susceptibility   Escherichia coli - MIC*    AMPICILLIN <=2 SENSITIVE Sensitive     CEFAZOLIN <=4 SENSITIVE Sensitive     CEFTRIAXONE <=1 SENSITIVE Sensitive     CIPROFLOXACIN <=0.25 SENSITIVE Sensitive     GENTAMICIN <=1 SENSITIVE Sensitive     IMIPENEM <=0.25 SENSITIVE Sensitive     NITROFURANTOIN <=16 SENSITIVE Sensitive     TRIMETH/SULFA <=20 SENSITIVE Sensitive     AMPICILLIN/SULBACTAM <=2 SENSITIVE Sensitive     PIP/TAZO <=4 SENSITIVE Sensitive     Extended ESBL NEGATIVE Sensitive     * 30,000 COLONIES/mL ESCHERICHIA COLI  SARS CORONAVIRUS 2 (TAT 6-24 HRS) Nasopharyngeal Nasopharyngeal Swab     Status: None   Collection Time: 06/01/19 10:46 PM   Specimen: Nasopharyngeal Swab  Result Value Ref Range Status   SARS Coronavirus 2 NEGATIVE NEGATIVE Final    Comment: (NOTE) SARS-CoV-2 target nucleic acids are NOT DETECTED. The SARS-CoV-2 RNA is generally detectable in upper and lower respiratory specimens during the acute phase of infection. Negative results do not preclude SARS-CoV-2 infection, do not rule out co-infections with other pathogens, and  should not be used as the sole basis for treatment or other patient management decisions. Negative results must be combined with clinical observations, patient history, and epidemiological information. The expected result is Negative. Fact Sheet for  Patients: SugarRoll.be Fact Sheet for Healthcare Providers: https://www.woods-mathews.com/ This test is not yet approved or cleared by the [place] FDA and  has been authorized for detection and/or diagnosis of SARS-CoV-2 by FDA under an Emergency Use Authorization (EUA). This EUA will remain  in effect (meaning this test can be used) for the duration of the COVID-19 declaration under Section 56 4(b)(1) of the Act, 21 U.S.C. section 360bbb-3(b)(1), unless the authorization is terminated or revoked sooner. Performed at [facility], [place] [address], [place], [place] [address]   Hsv Culture And Typing     Status: Abnormal   Collection Time: 06/02/19  5:48 PM   Specimen: Skin, Other  Result Value Ref Range Status   HSV Culture/Type Comment (A)  Final    Comment: (NOTE) Positive for Herpes simplex virus type-2. Typing was confirmed by monoclonal antibody microscopic immunofluorescence. Performed At: [facility] [place], [place] [id number] Rush Farmer MD VV:[id number]    Source of Sample SKIN  Final    Comment: Performed at [facility], [place] [address] [place], [place] [address]    Radiology Reports US RENAL  Result Date: 05/28/2019 CLINICAL DATA:  Acute kidney injury. EXAM: RENAL / URINARY TRACT ULTRASOUND COMPLETE COMPARISON:  PET CT scan 03/11/2019. FINDINGS: Right Kidney: Renal measurements: 9.9 x 4.3 x 5.0 cm = volume: 109.7 mL . Echogenicity within normal limits. No mass or hydronephrosis visualized. Left Kidney: Renal measurements: 9.6 x 5.4 x 4.9 cm = volume: Is 134.3. mL. Echogenicity within normal limits. No mass or hydronephrosis visualized. Bladder: Appears normal for degree of bladder distention. Other: Small right pleural effusion and gallstones are noted as seen on the prior exam. IMPRESSION: Negative for hydronephrosis.  Normal appearing kidneys. Right pleural effusion. Gallstones.  Electronically Signed   By: Inge Rise M.D.   On: 05/28/2019 07:15     Time Spent in minutes  30     Desiree Hane M.D on 06/05/2019 at 8:31 AM  To page go to [email or web address] - password [email or web address]

## 2019-06-06 LAB — BASIC METABOLIC PANEL
Anion gap: 8 (ref 5–15)
BUN: 13 mg/dL (ref 8–23)
CO2: 24 mmol/L (ref 22–32)
Calcium: 8 mg/dL — ABNORMAL LOW (ref 8.9–10.3)
Chloride: 99 mmol/L (ref 98–111)
Creatinine, Ser: 1.21 mg/dL — ABNORMAL HIGH (ref 0.44–1.00)
GFR calc Af Amer: 55 mL/min — ABNORMAL LOW (ref >60)
GFR calc non Af Amer: 48 mL/min — ABNORMAL LOW (ref >60)
Glucose, Bld: 101 mg/dL — ABNORMAL HIGH (ref 70–99)
Potassium: 3.4 mmol/L — ABNORMAL LOW (ref 3.5–5.1)
Sodium: 131 mmol/L — ABNORMAL LOW (ref 135–145)

## 2019-06-06 LAB — CBC
HCT: 23.9 % — ABNORMAL LOW (ref 36.0–46.0)
Hemoglobin: 8 g/dL — ABNORMAL LOW (ref 12.0–15.0)
MCH: 31.4 pg (ref 26.0–34.0)
MCHC: 33.5 g/dL (ref 30.0–36.0)
MCV: 93.7 fL (ref 80.0–100.0)
Platelets: 71 10*3/uL — ABNORMAL LOW (ref 150–400)
RBC: 2.55 MIL/uL — ABNORMAL LOW (ref 3.87–5.11)
RDW: 18.9 % — ABNORMAL HIGH (ref 11.5–15.5)
WBC: 3.2 10*3/uL — ABNORMAL LOW (ref 4.0–10.5)
nRBC: 2.2 % — ABNORMAL HIGH (ref 0.0–0.2)

## 2019-06-06 NOTE — Progress Notes (Signed)
[facility]  PROGRESS NOTE  Kayla Price WUJ:[id number] DOB: 19-Jul-1955 DOA: 06/01/2019 PCP: Lauree Chandler, NP Admit date - 06/01/2019   Admitting Physician Elwyn Reach, MD  Outpatient Primary MD for the patient is Lauree Chandler, NP  LOS - 3 Brief Narrative   Kayla Price is a 63 y.o. year old female with medical history significant for breast cancer with metastasis to the brain and right pleura status post whole brain radiation, HIV,  CKD stage II, and recent mission from 12/2-12/6 for AKI, hypotension and superficial skin breakdown/ulcerations of the groin/perineum who presented on 06/01/2019 1 day after discharge with reports of worsening drainage from known skin ulcerations and generalized weakness.  In the ED findings notable for blood pressure 88/64, WBC 2.4, platelets 54, creatinine 1.75 (baseline 1.1), lactic acid within normal limits.  Patient was given IV fluids and admitted with working diagnosis of AKI, hypotension, and decubitus ulcerations of buttocks requiring aggressive wound care.  Hospital course complicated by superficial skin breakdown of groin/perineal area consistent with intertrigo/fungal infection with no signs of decubitus ulcers for which wound care saw recommended appropriate dressings, nystatin;  Subjective  Kayla Price today reports continued improvement in pain control of skin breakdown in groin area.  Denies fevers, chills, cough, shortness of breath, abdominal pain, nausea or vomiting.  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 Lesions look less raw and patient reports much better in terms of pain. HSV-2 PCR of skin returned +  -- Valtrex 1 g TID PO x 7 days, d/c fluconazole --f/u with ID as outpt arranged -Keeping this area clean/dry is key, currently has Foley for urinary retention  AKI on CKD, with urinary retention.  Creatinine improving with Foley in place.  Creatinine back  at baseline.  Likely acute exacerbation in setting of brief hypotension and continue use of Benicar/also discontinued on recent hospitalization -Avoid nephrotoxins -Daily BMP  Hypotension, improving.  BP range last 24 hours 99/68-121/76.  Decadron discontinued given random cortisol was 19, not consistent with adrenal insufficiency.  Patient previously on Decadron for taper related to malignancy. -Closely monitor BP off steroids -Recommend discontinuation of Benicar on discharge  Hypothyroidism, stable -Continue Synthroid  Physical deconditioning.  Worked with PT on 12/8 and again on 12/10.  Prior level of independence: Independent with assistive devices.  Pain from groin site previously limited ambulation, but much better today in pain control and healing of skin site.  In working with PT today they have noticed improvement in her strength and continue to believe patient would benefit from SNF level rehab upon D/C, social worker consulted to assist -encourage OOB to chair with assistance -PT recommends SNF, I agree SNF rehab would be beneficial  Given her prior level of independence and improvement in her pain that was previously limiting her --updated daughter on dispo on 12/10  HIV, controlled.  CD4 greater than 400 on 10/26 -Continue darunavir -cobicistat, dolutegravir  Recent E. coli UTI.  Diagnosed on previous hospital course. -Completed cefdinir course.  Pancytopenia, likely chemotherapy associated.  Stable.  No signs or symptoms of bleeding -Monitor CBC, SCDs  Metastatic breast cancer, metastases to the brain(04/2019) and right pleural space(03/2018)   Status post whole brain radiation (11/5-11/18/2020 -Followed by Dr. Jana Hakim, plan for outpatient PET scan to determine next step in treatment   Family Communication  :  Spoke with daughter on 12/11, updated on plan to consider SNF, will update today  Code Status :  DNR  Disposition Plan  :Medically stable. PT recommends SNF  given need for moderate-max assistance for bed mobility and transfers.  Previous baseline ambulatory with occasional use of cane, pain better controlled today.   Consults  :  Dr. Jana Hakim, oncology, ID  Procedures  :  none  DVT Prophylaxis  :   SCDs   Lab Results  Component Value Date   PLT 71 (L) 06/06/2019    Diet :  Diet Order            Diet regular Room service appropriate? Yes; Fluid consistency: Thin  Diet effective now               Inpatient Medications Scheduled Meds: . Chlorhexidine Gluconate Cloth  6 each Topical Daily  . darunavir-cobicistat  1 tablet Oral QHS  . dolutegravir  50 mg Oral QHS   And  . rilpivirine  25 mg Oral QHS  . feeding supplement (ENSURE ENLIVE)  237 mL Oral TID BM  . levothyroxine  200 mcg Oral QAC breakfast  . maraviroc  150 mg Oral BID  . multivitamin with minerals  1 tablet Oral Daily  . valACYclovir  1,000 mg Oral BID   Continuous Infusions:  PRN Meds:.acetaminophen **OR** acetaminophen, calcium carbonate (dosed in mg elemental calcium), camphor-menthol **AND** hydrOXYzine, docusate sodium, HYDROcodone-acetaminophen, ondansetron **OR** ondansetron (ZOFRAN) IV, sodium chloride flush, sorbitol, zolpidem  Antibiotics  :   Anti-infectives (From admission, onward)   Start     Dose/Rate Route Frequency Ordered Stop   06/05/19 2200  valACYclovir (VALTREX) tablet 1,000 mg     1,000 mg Oral 2 times daily 06/05/19 1418 06/12/19 2159   06/05/19 1000  valACYclovir (VALTREX) tablet 1,000 mg  Status:  Discontinued     1,000 mg Oral 3 times daily 06/05/19 0834 06/05/19 1418   06/02/19 2200  darunavir-cobicistat (PREZCOBIX) 800-150 MG per tablet 1 tablet     1 tablet Oral Daily at bedtime 06/02/19 0950     06/02/19 2200  dolutegravir (TIVICAY) tablet 50 mg     50 mg Oral Daily at bedtime 06/02/19 1455     06/02/19 2200  rilpivirine (EDURANT) tablet 25 mg     25 mg Oral Daily at bedtime 06/02/19 1455     06/02/19 1700  dolutegravir (TIVICAY)  tablet 50 mg  Status:  Discontinued     50 mg Oral Daily with supper 06/02/19 1453 06/02/19 1456   06/02/19 1700  rilpivirine (EDURANT) tablet 25 mg  Status:  Discontinued     25 mg Oral Daily with supper 06/02/19 1453 06/02/19 1456   06/02/19 1415  fluconazole (DIFLUCAN) IVPB 200 mg  Status:  Discontinued     200 mg 100 mL/hr over 60 Minutes Intravenous Every 24 hours 06/02/19 1402 06/05/19 0834   06/02/19 1100  maraviroc (SELZENTRY) tablet 150 mg     150 mg Oral 2 times daily 06/02/19 0950     06/02/19 1000  cefdinir (OMNICEF) capsule 300 mg     300 mg Oral 2 times daily 06/02/19 0950 06/04/19 2207   06/02/19 1000  fluconazole (DIFLUCAN) tablet 400 mg  Status:  Discontinued     400 mg Oral Daily 06/02/19 0950 06/02/19 1402       Objective   Vitals:   06/05/19 2030 06/06/19 0538 06/06/19 0645 06/06/19 0842  BP: 106/68 (!) 85/56 97/66 90/66   Pulse: 91 88  90  Resp: 18 16  18   Temp: 100.1 F (37.8 C) 98.2 F (36.8 C)  98.6 F (37 C)  TempSrc: Oral Oral  Oral  SpO2: 93% 93%  95%  Weight:      Height:        SpO2: 95 %  Wt Readings from Last 3 Encounters:  06/02/19 90.5 kg  05/31/19 90.7 kg  05/19/19 88 kg     Intake/Output Summary (Last 24 hours) at 06/06/2019 1508 Last data filed at 06/06/2019 1300 Gross per 24 hour  Intake 840 ml  Output 1300 ml  Net -460 ml    Physical Exam:  Awake Alert, Oriented X 3, Normal affect No new F.N deficits,  [place].AT, Symmetrical Chest wall movement Groin area From 12/11          I have personally reviewed the following:   Data Reviewed:  CBC Recent Labs  Lab 06/01/19 1825 06/01/19 2319 06/02/19 0525 06/04/19 0830 06/05/19 0840 06/06/19 0744  WBC 2.5* 2.4* 2.5* 3.1* 3.2* 3.2*  HGB 8.7* 7.9* 8.2* 8.4* 8.1* 8.0*  HCT 26.5* 24.5* 25.2* 25.3* 24.4* 23.9*  PLT 65* 61* 67* 80* 79* 71*  MCV 95.3 97.6 96.2 94.8 93.8 93.7  MCH 31.3 31.5 31.3 31.5 31.2 31.4  MCHC 32.8 32.2 32.5 33.2 33.2 33.5  RDW 18.1* 18.2*  18.5* 18.4* 18.7* 18.9*  LYMPHSABS 0.5*  --   --   --   --   --   MONOABS 0.2  --   --   --   --   --   EOSABS 0.0  --   --   --   --   --   BASOSABS 0.0  --   --   --   --   --     Chemistries  Recent Labs  Lab 05/31/19 0500 06/01/19 1825 06/01/19 2319 06/02/19 0525 06/04/19 0830 06/05/19 0840 06/06/19 0744  NA 142 139  --  142 136 132* 131*  K 4.2 4.8  --  4.1 4.0 4.0 3.4*  CL 114* 113*  --  112* 107 103 99  CO2 21* 20*  --  20* 20* 22 24  GLUCOSE 95 129*  --  122* 110* 100* 101*  BUN 36* 39*  --  35* 21 16 13   CREATININE 1.19* 1.75* 1.68* 1.61* 1.28* 1.17* 1.21*  CALCIUM 8.6* 8.8*  --  8.4* 8.0* 8.1* 8.0*  AST 22 18  --  20  --   --   --   ALT 36 31  --  28  --   --   --   ALKPHOS 45 42  --  42  --   --   --   BILITOT 0.5 0.2*  --  0.4  --   --   --    ------------------------------------------------------------------------------------------------------------------ No results for input(s): CHOL, HDL, LDLCALC, TRIG, CHOLHDL, LDLDIRECT in the last 72 hours.  Lab Results  Component Value Date   HGBA1C 6.1 03/07/2019   ------------------------------------------------------------------------------------------------------------------ No results for input(s): TSH, T4TOTAL, T3FREE, THYROIDAB in the last 72 hours.  Invalid input(s): FREET3 ------------------------------------------------------------------------------------------------------------------ No results for input(s): VITAMINB12, FOLATE, FERRITIN, TIBC, IRON, RETICCTPCT in the last 72 hours.  Coagulation profile No results for input(s): INR, PROTIME in the last 168 hours.  No results for input(s): DDIMER in the last 72 hours.  Cardiac Enzymes No results for input(s): CKMB, TROPONINI, MYOGLOBIN in the last 168 hours.  Invalid input(s): CK ------------------------------------------------------------------------------------------------------------------ No results found for: BNP  Micro Results Recent Results  (from the past 240 hour(s))  SARS CORONAVIRUS 2 (TAT 6-24 HRS) Nasopharyngeal  Nasopharyngeal Swab     Status: None   Collection Time: 05/28/19  5:07 AM   Specimen: Nasopharyngeal Swab  Result Value Ref Range Status   SARS Coronavirus 2 NEGATIVE NEGATIVE Final    Comment: (NOTE) SARS-CoV-2 target nucleic acids are NOT DETECTED. The SARS-CoV-2 RNA is generally detectable in upper and lower respiratory specimens during the acute phase of infection. Negative results do not preclude SARS-CoV-2 infection, do not rule out co-infections with other pathogens, and should not be used as the sole basis for treatment or other patient management decisions. Negative results must be combined with clinical observations, patient history, and epidemiological information. The expected result is Negative. Fact Sheet for Patients: SugarRoll.be Fact Sheet for Healthcare Providers: https://www.woods-mathews.com/ This test is not yet approved or cleared by the [place] FDA and  has been authorized for detection and/or diagnosis of SARS-CoV-2 by FDA under an Emergency Use Authorization (EUA). This EUA will remain  in effect (meaning this test can be used) for the duration of the COVID-19 declaration under Section 56 4(b)(1) of the Act, 21 U.S.C. section 360bbb-3(b)(1), unless the authorization is terminated or revoked sooner. Performed at [facility], [place] [address], [place], [place] [address]   Culture, blood (routine x 2)     Status: None   Collection Time: 05/28/19  6:24 AM   Specimen: BLOOD LEFT WRIST  Result Value Ref Range Status   Specimen Description BLOOD LEFT WRIST  Final   Special Requests   Final    BOTTLES DRAWN AEROBIC AND ANAEROBIC Blood Culture results may not be optimal due to an inadequate volume of blood received in culture bottles   Culture   Final    NO GROWTH 5 DAYS Performed at [facility], [place] [address]  [place], [place] [address]    Report Status 06/02/2019 FINAL  Final  Culture, blood (routine x 2)     Status: None   Collection Time: 05/28/19  6:40 AM   Specimen: BLOOD  Result Value Ref Range Status   Specimen Description BLOOD LEFT ANTECUBITAL  Final   Special Requests   Final    BOTTLES DRAWN AEROBIC AND ANAEROBIC Blood Culture adequate volume   Culture   Final    NO GROWTH 5 DAYS Performed at [facility], [place] [address] [place], [place] [address]    Report Status 06/02/2019 FINAL  Final  Urine Culture     Status: Abnormal   Collection Time: 05/28/19 11:17 AM   Specimen: Urine, Random  Result Value Ref Range Status   Specimen Description URINE, RANDOM  Final   Special Requests   Final    NONE Performed at [facility], [place] [address] [place], [place] [address]    Culture 30,000 COLONIES/mL ESCHERICHIA COLI (A)  Final   Report Status 05/30/2019 FINAL  Final   Organism ID, Bacteria ESCHERICHIA COLI (A)  Final      Susceptibility   Escherichia coli - MIC*    AMPICILLIN <=2 SENSITIVE Sensitive     CEFAZOLIN <=4 SENSITIVE Sensitive     CEFTRIAXONE <=1 SENSITIVE Sensitive     CIPROFLOXACIN <=0.25 SENSITIVE Sensitive     GENTAMICIN <=1 SENSITIVE Sensitive     IMIPENEM <=0.25 SENSITIVE Sensitive     NITROFURANTOIN <=16 SENSITIVE Sensitive     TRIMETH/SULFA <=20 SENSITIVE Sensitive     AMPICILLIN/SULBACTAM <=2 SENSITIVE Sensitive     PIP/TAZO <=4 SENSITIVE Sensitive     Extended ESBL NEGATIVE Sensitive     *  30,000 COLONIES/mL ESCHERICHIA COLI  SARS CORONAVIRUS 2 (TAT 6-24 HRS) Nasopharyngeal Nasopharyngeal Swab     Status: None   Collection Time: 06/01/19 10:46 PM   Specimen: Nasopharyngeal Swab  Result Value Ref Range Status   SARS Coronavirus 2 NEGATIVE NEGATIVE Final    Comment: (NOTE) SARS-CoV-2 target nucleic acids are NOT DETECTED. The SARS-CoV-2 RNA is generally detectable in upper and lower respiratory specimens during the acute phase of infection.  Negative results do not preclude SARS-CoV-2 infection, do not rule out co-infections with other pathogens, and should not be used as the sole basis for treatment or other patient management decisions. Negative results must be combined with clinical observations, patient history, and epidemiological information. The expected result is Negative. Fact Sheet for Patients: SugarRoll.be Fact Sheet for Healthcare Providers: https://www.woods-mathews.com/ This test is not yet approved or cleared by the [place] FDA and  has been authorized for detection and/or diagnosis of SARS-CoV-2 by FDA under an Emergency Use Authorization (EUA). This EUA will remain  in effect (meaning this test can be used) for the duration of the COVID-19 declaration under Section 56 4(b)(1) of the Act, 21 U.S.C. section 360bbb-3(b)(1), unless the authorization is terminated or revoked sooner. Performed at [facility], [place] [address] [place], [place] [address]   Hsv Culture And Typing     Status: Abnormal   Collection Time: 06/02/19  5:48 PM   Specimen: Skin, Other  Result Value Ref Range Status   HSV Culture/Type Comment (A)  Final    Comment: (NOTE) Positive for Herpes simplex virus type-2. Typing was confirmed by monoclonal antibody microscopic immunofluorescence. Performed At: [facility], [place] [id number] Rush Farmer MD LZ:[id number]    Source of Sample SKIN  Final    Comment: Performed at [facility], [place] [address], [place], [place] [address]    Radiology Reports US RENAL  Result Date: 05/28/2019 CLINICAL DATA:  Acute kidney injury. EXAM: RENAL / URINARY TRACT ULTRASOUND COMPLETE COMPARISON:  PET CT scan 03/11/2019. FINDINGS: Right Kidney: Renal measurements: 9.9 x 4.3 x 5.0 cm = volume: 109.7 mL . Echogenicity within normal limits. No mass or hydronephrosis visualized. Left Kidney: Renal measurements: 9.6 x  5.4 x 4.9 cm = volume: Is 134.3. mL. Echogenicity within normal limits. No mass or hydronephrosis visualized. Bladder: Appears normal for degree of bladder distention. Other: Small right pleural effusion and gallstones are noted as seen on the prior exam. IMPRESSION: Negative for hydronephrosis.  Normal appearing kidneys. Right pleural effusion. Gallstones. Electronically Signed   By: Inge Rise M.D.   On: 05/28/2019 07:15     Time Spent in minutes  30     Desiree Hane M.D on 06/06/2019 at 3:08 PM  To page go to [email or web address] - password [facility]

## 2019-06-06 NOTE — Plan of Care (Signed)
  Problem: Education: Goal: Knowledge of General Education information will improve Description Including pain rating scale, medication(s)/side effects and non-pharmacologic comfort measures Outcome: Progressing   

## 2019-06-07 DIAGNOSIS — A6000 Herpesviral infection of urogenital system, unspecified: Secondary | ICD-10-CM

## 2019-06-07 NOTE — TOC Initial Note (Signed)
Transition of Care [facility]) - Initial/Assessment Note    Patient Details  Name: Kayla Price MRN: [id number] Date of Birth: [birth year]  [facility] CM/SW Contact:    Vinie Sill, [place] Phone Number: 06/07/2019, 11:40 AM  Clinical Narrative:                  CSW visit with the patient at bedside. CSW introduced self and explained role. CSW dicussed PT recommendation of ST rehab at [facility]. e. Patient was alert and answered appropriately. Patient agrees with discharge plan of DT rehab at [facility]. Patient states she has been to [facility] before and would to keep them as an option. Patient would like SNF referral sent to [facility] but states no preference at this time-will wait on bed offers. Patient states she has support at home. Patient states her daughter,[name],  and grandchild lives in the home with her.  Patient expressed her main concern is to make sure the SNF is able to provided care for wound. CSW explained the SNF process. CSW answered all questions and concerns. Patient states no further questions at this time.   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 She was employed for over 25 years with the [employer]. 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    Patient requested to contact her daughter, Orland Dec, to discuss PT recoomendations. CSW- Left voice message to return call.   Thurmond Butts, MSW, [facility] Clinical Social Worker [phone]   Expected Discharge Plan: Skilled Nursing Facility Barriers to Discharge: SNF Pending bed offer, Insurance Authorization   Patient Goals and CMS Choice Patient states their goals for this hospitalization and ongoing recovery are:: want to make sure to get wound care      Expected Discharge Plan and Services Expected Discharge Plan: [place]       Living arrangements for the past 2 months: Single Family Home                                       Prior Living Arrangements/Services Living arrangements for the past 2 months: Single Family Home Lives with:: Self, Adult Children, Other (Comment)(grandchild) Patient language and need for interpreter reviewed:: No        Need for Family Participation in Patient Care: Yes (Comment) Care giver support system in place?: Yes (comment)   Criminal Activity/Legal Involvement Pertinent to Current Situation/Hospitalization: No - Comment as needed  Activities of Daily Living Home Assistive Devices/Equipment: Cane (specify quad or straight), Wheelchair, Environmental consultant (specify type) ADL Screening (condition at time of admission) Patient's cognitive ability adequate to safely complete daily activities?: Yes Is the patient deaf or have difficulty hearing?: No Does the patient have difficulty seeing, even when wearing glasses/contacts?: No Does the patient have difficulty concentrating, remembering, or making decisions?: No Patient able to express need for assistance with ADLs?: Yes Does the patient have difficulty dressing or bathing?: No Independently performs ADLs?: Yes (appropriate for developmental age) Does the patient have difficulty walking or climbing stairs?: Yes Weakness of Legs: Both Weakness of Arms/Hands: None  Permission Sought/Granted Permission sought to share information with : Family Supports Permission granted to share information with : Yes, Verbal Permission Granted  Share Information with NAME: [name] Conservator, museum/gallery granted to share info w AGENCY: SNFs  Permission granted to share info w Relationship: daughter  Permission granted to share info w Contact Information: [phone]  Emotional Assessment Appearance:: Appears stated age Attitude/Demeanor/Rapport: Engaged Affect (typically observed): Appropriate, Accepting, Pleasant Orientation: : Oriented to Self, Oriented to Place, Oriented to  Time, Oriented to Situation Alcohol / Substance Use: Not Applicable Psych  Involvement: No (comment)  Admission diagnosis:  Urinary retention [R33.9] AKI (acute kidney injury) ([place]) [N17.9] Hypotension, unspecified hypotension type [I95.9] Patient Active Problem List   Diagnosis Date Noted  . 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 Intertrigo 06/04/2019  . Pancytopenia ([place]) 06/04/2019  . Malnutrition of moderate degree 06/03/2019  . Alteration in skin integrity due to moisture 06/02/2019  . DNR (do not resuscitate) 06/02/2019  . AKI (acute kidney injury) ([place]) 05/28/2019  . Hyperkalemia 05/28/2019  . UTI (urinary tract infection) 05/28/2019  . Hypotension 05/28/2019  . Acute kidney injury superimposed on CKD ([place]) 05/28/2019  . Brain metastasis ([place]) 04/28/2019  . Hypothyroidism 04/28/2019  . Anemia of chronic disease 04/28/2019  . Encounter for antineoplastic chemotherapy 02/06/2019  . Port-A-Cath in place 08/01/2018  . Goals of care, counseling/discussion 06/26/2018  . Recurrent right pleural effusion 05/30/2018  . Malignant pleural effusion 05/19/2018  . Hilar adenopathy 05/15/2018  . Pleural effusion on right 04/25/2018  . Seasonal allergic rhinitis due to pollen 01/16/2018  . Aortic atherosclerosis ([place]) 11/11/2017  . Morbid obesity with BMI of 40.0-44.9, adult ([place]) 11/11/2017  . Genetic testing 06/27/2017  . Family history of non-Hodgkin's lymphoma   . Family history of lung cancer   . Endometrial thickening on ultrasound 01/25/2017  . Metastatic breast cancer ([place]) 11/05/2016  . Malignant neoplasm of upper-outer quadrant of right breast in female, estrogen receptor negative ([place]) 10/09/2016  . Primary localized osteoarthritis of left knee 02/06/2016  . Primary osteoarthritis of left knee 02/05/2016  . Osteoarthritis of left knee 06/08/2015  . CKD (chronic kidney disease), stage III 06/08/2015  . Vitamin D deficiency 09/23/2014  . Acute renal insufficiency 04/23/2014  . 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 Bell's palsy 12/04/2012  . Contact  dermatitis 08/29/2011  . Dry eyes 08/20/2011  . Dry mouth 08/20/2011  . Neuropathy 04/09/2011  . Insomnia 04/09/2011  . Obesities, morbid ([place]) 09/27/2010  . Endometrial polyp 12/22/2009  . Alopecia areata 11/28/2009  . MENORRHAGIA, POSTMENOPAUSAL 02/03/2009  . ABNORMAL GLANDULAR PAPANICOLAOU SMEAR OF CERVIX 11/04/2008  . ALOPECIA 05/06/2008  . Trigger finger, acquired 05/06/2008  . HIP FRACTURE, RIGHT 05/06/2008  . ARTHROSCOPY, LEFT KNEE, HX OF 02/03/2008  . HYPERLIPIDEMIA, MIXED 12/15/2007  . HAND PAIN, RIGHT 12/15/2007  . Other abnormal glucose 12/15/2007  . UNSPECIFIED VITAMIN D DEFICIENCY 08/06/2007  . TINEA CAPITIS 08/04/2007  . CNTC DERMATITIS&OTH ECZEMA DUE OTH CHEM PRODUCTS 08/04/2007  . PVD 04/07/2007  . Secondary localized osteoarthrosis 04/07/2007  . Human immunodeficiency virus (HIV) disease ([place]) 03/27/2006  . HTN (hypertension) 03/27/2006  . BREAST CANCER, HX OF 03/27/2006   PCP:  Lauree Chandler, NP Pharmacy:   [id number] [place] [address] [place] [address] Phone: [phone] Fax: [phone]  [place], [place] [address] Phone: [phone] Fax: [phone]     Social Determinants of Health (SDOH) Interventions    Readmission Risk Interventions No flowsheet data found.

## 2019-06-07 NOTE — NC FL2 (Signed)
[place] LEVEL OF CARE SCREENING TOOL     IDENTIFICATION  Patient Name: Kayla Price Birthdate: 07/17/1955 Sex: female Admission Date (Current Location): 06/01/2019  [facility] and [place] Number:  Herbalist and Address:  [facility], [place] [address] [place], [place] [address]      Provider Number: [id number]  Attending Physician Name and Address:  Desiree Hane, MD  Relative Name and Phone Number:  Durwin Glaze [phone]    Current Level of Care: Hospital Recommended Level of Care: [place] Prior Approval Number:    Date Approved/Denied:   PASRR Number: [id number]  Discharge Plan: SNF    Current Diagnoses: Patient Active Problem List   Diagnosis Date Noted  . 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 Intertrigo 06/04/2019  . Pancytopenia ([place]) 06/04/2019  . Malnutrition of moderate degree 06/03/2019  . Alteration in skin integrity due to moisture 06/02/2019  . DNR (do not resuscitate) 06/02/2019  . AKI (acute kidney injury) ([place]) 05/28/2019  . Hyperkalemia 05/28/2019  . UTI (urinary tract infection) 05/28/2019  . Hypotension 05/28/2019  . Acute kidney injury superimposed on CKD ([place]) 05/28/2019  . Brain metastasis ([place]) 04/28/2019  . Hypothyroidism 04/28/2019  . Anemia of chronic disease 04/28/2019  . Encounter for antineoplastic chemotherapy 02/06/2019  . Port-A-Cath in place 08/01/2018  . Goals of care, counseling/discussion 06/26/2018  . Recurrent right pleural effusion 05/30/2018  . Malignant pleural effusion 05/19/2018  . Hilar adenopathy 05/15/2018  . Pleural effusion on right 04/25/2018  . Seasonal allergic rhinitis due to pollen 01/16/2018  . Aortic atherosclerosis ([place]) 11/11/2017  . Morbid obesity with BMI of 40.0-44.9, adult ([place]) 11/11/2017  . Genetic testing 06/27/2017  . Family history of non-Hodgkin's lymphoma   . Family history of lung cancer   . Endometrial thickening on  ultrasound 01/25/2017  . Metastatic breast cancer ([place]) 11/05/2016  . Malignant neoplasm of upper-outer quadrant of right breast in female, estrogen receptor negative ([place]) 10/09/2016  . Primary localized osteoarthritis of left knee 02/06/2016  . Primary osteoarthritis of left knee 02/05/2016  . Osteoarthritis of left knee 06/08/2015  . CKD (chronic kidney disease), stage III 06/08/2015  . Vitamin D deficiency 09/23/2014  . Acute renal insufficiency 04/23/2014  . 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 Bell's palsy 12/04/2012  . Contact dermatitis 08/29/2011  . Dry eyes 08/20/2011  . Dry mouth 08/20/2011  . Neuropathy 04/09/2011  . Insomnia 04/09/2011  . Obesities, morbid ([place]) 09/27/2010  . Endometrial polyp 12/22/2009  . Alopecia areata 11/28/2009  . MENORRHAGIA, POSTMENOPAUSAL 02/03/2009  . ABNORMAL GLANDULAR PAPANICOLAOU SMEAR OF CERVIX 11/04/2008  . ALOPECIA 05/06/2008  . Trigger finger, acquired 05/06/2008  . HIP FRACTURE, RIGHT 05/06/2008  . ARTHROSCOPY, LEFT KNEE, HX OF 02/03/2008  . HYPERLIPIDEMIA, MIXED 12/15/2007  . HAND PAIN, RIGHT 12/15/2007  . Other abnormal glucose 12/15/2007  . UNSPECIFIED VITAMIN D DEFICIENCY 08/06/2007  . TINEA CAPITIS 08/04/2007  . CNTC DERMATITIS&OTH ECZEMA DUE OTH CHEM PRODUCTS 08/04/2007  . PVD 04/07/2007  . Secondary localized osteoarthrosis 04/07/2007  . Human immunodeficiency virus (HIV) disease ([place]) 03/27/2006  . HTN (hypertension) 03/27/2006  . BREAST CANCER, HX OF 03/27/2006    Orientation RESPIRATION BLADDER Height & Weight     Self, Time, Situation, Place  Normal Continent(urinary catheter) Weight: 198 lb 13.7 oz (90.2 kg) Height:  5\' 7"  (170.2 cm)  BEHAVIORAL SYMPTOMS/MOOD NEUROLOGICAL BOWEL NUTRITION STATUS      Continent Diet(please see discharge summary)  AMBULATORY STATUS COMMUNICATION OF  NEEDS Skin   Limited Assist Verbally (skin tears, buttocks,groin,labia,right;left , pressure injury buttocks right stage II,  foam-lift drssing, PRN, pressure injury buttocks left; foam lift dressing, PRN, wound incision(open/dehisced) nonpressure groin, righgt,left; clean dry,itact, PRN)                       Personal Care Assistance Level of Assistance  Bathing, Dressing Bathing Assistance: Limited assistance   Dressing Assistance: Independent     Functional Limitations Info  Sight, Hearing, Speech Sight Info: Adequate Hearing Info: Adequate Speech Info: Adequate    SPECIAL CARE FACTORS FREQUENCY  PT (By licensed PT), OT (By licensed OT)     PT Frequency: 3x per week OT Frequency: 3x per week            Contractures Contractures Info: Not present    Additional Factors Info  Code Status, Allergies Code Status Info: DNR Allergies Info: Lisinopril,Pepcid,Prilosec,Tums           Current Medications (06/07/2019):  This is the current hospital active medication list Current Facility-Administered Medications  Medication Dose Route Frequency Provider Last Rate Last Admin  . acetaminophen (TYLENOL) tablet 650 mg  650 mg Oral Q6H PRN Elwyn Reach, MD   650 mg at 06/02/19 1142   Or  . acetaminophen (TYLENOL) suppository 650 mg  650 mg Rectal Q6H PRN Gala Romney L, MD      . calcium carbonate (dosed in mg elemental calcium) suspension 500 mg of elemental calcium  500 mg of elemental calcium Oral Q6H PRN Elwyn Reach, MD      . camphor-menthol (SARNA) lotion 1 application  1 application Topical W4R PRN Elwyn Reach, MD       And  . hydrOXYzine (ATARAX/VISTARIL) tablet 25 mg  25 mg Oral Q8H PRN Elwyn Reach, MD      . Chlorhexidine Gluconate Cloth 2 % PADS 6 each  6 each Topical Daily Eulogio Bear U, DO   6 each at 06/07/19 1056  . darunavir-cobicistat (PREZCOBIX) 800-150 MG per tablet 1 tablet  1 tablet Oral QHS Eulogio Bear U, DO   1 tablet at 06/06/19 2204  . docusate sodium (ENEMEEZ) enema 283 mg  1 enema Rectal PRN Elwyn Reach, MD      . dolutegravir (TIVICAY)  tablet 50 mg  50 mg Oral QHS Susa Raring, RPH   50 mg at 06/06/19 2204   And  . rilpivirine (EDURANT) tablet 25 mg  25 mg Oral QHS Susa Raring, [place]   25 mg at 06/06/19 2204  . feeding supplement (ENSURE ENLIVE) (ENSURE ENLIVE) liquid 237 mL  237 mL Oral TID BM Domenic Polite, MD   237 mL at 06/06/19 2204  . HYDROcodone-acetaminophen (NORCO/VICODIN) 5-325 MG per tablet 1 tablet  1 tablet Oral Q6H PRN Eulogio Bear U, DO   1 tablet at 06/05/19 2324  . levothyroxine (SYNTHROID) tablet 200 mcg  200 mcg Oral QAC breakfast Eulogio Bear U, DO   200 mcg at 06/07/19 0836  . maraviroc (SELZENTRY) tablet 150 mg  150 mg Oral BID Eulogio Bear U, DO   150 mg at 06/07/19 1055  . multivitamin with minerals tablet 1 tablet  1 tablet Oral Daily Domenic Polite, MD   1 tablet at 06/07/19 1057  . ondansetron (ZOFRAN) tablet 4 mg  4 mg Oral Q6H PRN Elwyn Reach, MD       Or  . ondansetron (ZOFRAN) injection 4 mg  4  mg Intravenous Q6H PRN Gala Romney L, MD      . sodium chloride flush (NS) 0.9 % injection 10-40 mL  10-40 mL Intracatheter PRN Magrinat, Virgie Dad, MD      . sorbitol 70 % solution 30 mL  30 mL Oral PRN Gala Romney L, MD      . valACYclovir (VALTREX) tablet 1,000 mg  1,000 mg Oral BID Golden Circle, FNP   1,000 mg at 06/07/19 1057  . zolpidem (AMBIEN) tablet 5 mg  5 mg Oral QHS PRN Elwyn Reach, MD         Discharge Medications: Please see discharge summary for a list of discharge medications.  Relevant Imaging Results:  Relevant Lab Results:   Additional Information SSN [id number]  Vinie Sill, LCSWA

## 2019-06-07 NOTE — Progress Notes (Signed)
[facility]  PROGRESS NOTE  Kayla Price JSH:[id number] DOB: 04-Dec-1955 DOA: 06/01/2019 PCP: Lauree Chandler, NP Admit date - 06/01/2019   Admitting Physician Elwyn Reach, MD  Outpatient Primary MD for the patient is Lauree Chandler, NP  LOS - 4 Brief Narrative   Kayla Price is a 63 y.o. year old female with medical history significant for breast cancer with metastasis to the brain and right pleura status post whole brain radiation, HIV,  CKD stage II, and recent mission from 12/2-12/6 for AKI, hypotension and superficial skin breakdown/ulcerations of the groin/perineum who presented on 06/01/2019 1 day after discharge with reports of worsening drainage from known skin ulcerations and generalized weakness.  In the ED findings notable for blood pressure 88/64, WBC 2.4, platelets 54, creatinine 1.75 (baseline 1.1), lactic acid within normal limits.  Patient was given IV fluids and admitted with working diagnosis of AKI, hypotension, and decubitus ulcerations of buttocks requiring aggressive wound care.  Hospital course complicated by superficial skin breakdown of groin/perineal area consistent with intertrigo/fungal infection with no signs of decubitus ulcers for which wound care saw recommended appropriate dressings, nystatin;  Subjective  Ms. [place] today stable pain control of skin breakdown in groin area.  Denies fevers, chills, cough, shortness of breath, abdominal pain, nausea or vomiting.  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 Lesions look less raw and patient reports much better in terms of pain. HSV-2 PCR of skin returned +  --Valtrex 1 g TID PO x 7 days, d/c fluconazole --f/u with ID as outpt arranged -Keeping this area clean/dry is key, currently has Foley for urinary retention  AKI on CKD, with urinary retention.  Creatinine improving with Foley in place.  Creatinine back at baseline.  Likely acute  exacerbation in setting of brief hypotension and continue use of Benicar/also discontinued on recent hospitalization -Avoid nephrotoxins -Daily BMP  Hypotension, Stable.  BP range last 24 hours 85/56--105/66.  Decadron discontinued given random cortisol was 19, not consistent with adrenal insufficiency.  Patient previously on Decadron for taper related to malignancy. -Closely monitor BP off steroids -Recommend discontinuation of Benicar on discharge  Hypothyroidism, stable -Continue Synthroid  Physical deconditioning.  Worked with PT on 12/8 and again on 12/10.  Prior level of independence: Independent with assistive devices.  Pain from groin site previously limited ambulation, but much better today in pain control and healing of skin site.  In working with PT today they have noticed improvement in her strength and continue to believe patient would benefit from SNF level rehab upon D/C, social worker consulted to assist -encourage OOB to chair with assistance -PT recommends SNF, I agree SNF rehab would be beneficial  Given her prior level of independence and improvement in her pain that was previously limiting her --updated daughter on dispo on 12/10  HIV, controlled.  CD4 greater than 400 on 10/26 -Continue darunavir -cobicistat, dolutegravir  Recent E. coli UTI.  Diagnosed on previous hospital course. -Completed cefdinir course.  Pancytopenia, likely chemotherapy associated.  Stable.  No signs or symptoms of bleeding -Monitor CBC, SCDs  Metastatic breast cancer, metastases to the brain(04/2019) and right pleural space(03/2018)   Status post whole brain radiation (11/5-11/18/2020 -Followed by Dr. Jana Hakim, plan for outpatient PET scan to determine next step in treatment   Family Communication  :  Spoke with daughter on 12/13 updated on medical plan Code Status :  DNR  Disposition Plan  :Medically stable. PT  recommends SNF given need for moderate-max assistance for bed mobility and  transfers.  Previous baseline ambulatory with occasional use of cane, pain better controlled today.   Consults  :  Dr. Jana Hakim, oncology, ID  Procedures  :  none  DVT Prophylaxis  :   SCDs   Lab Results  Component Value Date   PLT 71 (L) 06/06/2019    Diet :  Diet Order            Diet regular Room service appropriate? Yes; Fluid consistency: Thin  Diet effective now               Inpatient Medications Scheduled Meds: . Chlorhexidine Gluconate Cloth  6 each Topical Daily  . darunavir-cobicistat  1 tablet Oral QHS  . dolutegravir  50 mg Oral QHS   And  . rilpivirine  25 mg Oral QHS  . feeding supplement (ENSURE ENLIVE)  237 mL Oral TID BM  . levothyroxine  200 mcg Oral QAC breakfast  . maraviroc  150 mg Oral BID  . multivitamin with minerals  1 tablet Oral Daily  . valACYclovir  1,000 mg Oral BID   Continuous Infusions:  PRN Meds:.acetaminophen **OR** acetaminophen, calcium carbonate (dosed in mg elemental calcium), camphor-menthol **AND** hydrOXYzine, docusate sodium, HYDROcodone-acetaminophen, ondansetron **OR** ondansetron (ZOFRAN) IV, sodium chloride flush, sorbitol, zolpidem  Antibiotics  :   Anti-infectives (From admission, onward)   Start     Dose/Rate Route Frequency Ordered Stop   06/05/19 2200  valACYclovir (VALTREX) tablet 1,000 mg     1,000 mg Oral 2 times daily 06/05/19 1418 06/12/19 2159   06/05/19 1000  valACYclovir (VALTREX) tablet 1,000 mg  Status:  Discontinued     1,000 mg Oral 3 times daily 06/05/19 0834 06/05/19 1418   06/02/19 2200  darunavir-cobicistat (PREZCOBIX) 800-150 MG per tablet 1 tablet     1 tablet Oral Daily at bedtime 06/02/19 0950     06/02/19 2200  dolutegravir (TIVICAY) tablet 50 mg     50 mg Oral Daily at bedtime 06/02/19 1455     06/02/19 2200  rilpivirine (EDURANT) tablet 25 mg     25 mg Oral Daily at bedtime 06/02/19 1455     06/02/19 1700  dolutegravir (TIVICAY) tablet 50 mg  Status:  Discontinued     50 mg Oral Daily  with supper 06/02/19 1453 06/02/19 1456   06/02/19 1700  rilpivirine (EDURANT) tablet 25 mg  Status:  Discontinued     25 mg Oral Daily with supper 06/02/19 1453 06/02/19 1456   06/02/19 1415  fluconazole (DIFLUCAN) IVPB 200 mg  Status:  Discontinued     200 mg 100 mL/hr over 60 Minutes Intravenous Every 24 hours 06/02/19 1402 06/05/19 0834   06/02/19 1100  maraviroc (SELZENTRY) tablet 150 mg     150 mg Oral 2 times daily 06/02/19 0950     06/02/19 1000  cefdinir (OMNICEF) capsule 300 mg     300 mg Oral 2 times daily 06/02/19 0950 06/04/19 2207   06/02/19 1000  fluconazole (DIFLUCAN) tablet 400 mg  Status:  Discontinued     400 mg Oral Daily 06/02/19 0950 06/02/19 1402       Objective   Vitals:   06/06/19 2200 06/07/19 0424 06/07/19 0500 06/07/19 0848  BP:  94/68  105/66  Pulse: 90 91  93  Resp:  20  18  Temp:  98.5 F (36.9 C)  98.2 F (36.8 C)  TempSrc:      SpO2:  94%  97%  Weight:   90.2 kg   Height:        SpO2: 97 %  Wt Readings from Last 3 Encounters:  06/07/19 90.2 kg  05/31/19 90.7 kg  05/19/19 88 kg     Intake/Output Summary (Last 24 hours) at 06/07/2019 1547 Last data filed at 06/07/2019 1300 Gross per 24 hour  Intake 1257 ml  Output 2650 ml  Net -1393 ml    Physical Exam:  Awake Alert, Oriented X 3, Normal affect, lying in bed No new F.N deficits,  [place].AT, Symmetrical Chest wall movement Abdomen soft, normal bowel sounds           I have personally reviewed the following:   Data Reviewed:  CBC Recent Labs  Lab 06/01/19 1825 06/01/19 2319 06/02/19 0525 06/04/19 0830 06/05/19 0840 06/06/19 0744  WBC 2.5* 2.4* 2.5* 3.1* 3.2* 3.2*  HGB 8.7* 7.9* 8.2* 8.4* 8.1* 8.0*  HCT 26.5* 24.5* 25.2* 25.3* 24.4* 23.9*  PLT 65* 61* 67* 80* 79* 71*  MCV 95.3 97.6 96.2 94.8 93.8 93.7  MCH 31.3 31.5 31.3 31.5 31.2 31.4  MCHC 32.8 32.2 32.5 33.2 33.2 33.5  RDW 18.1* 18.2* 18.5* 18.4* 18.7* 18.9*  LYMPHSABS 0.5*  --   --   --   --   --     MONOABS 0.2  --   --   --   --   --   EOSABS 0.0  --   --   --   --   --   BASOSABS 0.0  --   --   --   --   --     Chemistries  Recent Labs  Lab 06/01/19 1825 06/01/19 2319 06/02/19 0525 06/04/19 0830 06/05/19 0840 06/06/19 0744  NA 139  --  142 136 132* 131*  K 4.8  --  4.1 4.0 4.0 3.4*  CL 113*  --  112* 107 103 99  CO2 20*  --  20* 20* 22 24  GLUCOSE 129*  --  122* 110* 100* 101*  BUN 39*  --  35* 21 16 13   CREATININE 1.75* 1.68* 1.61* 1.28* 1.17* 1.21*  CALCIUM 8.8*  --  8.4* 8.0* 8.1* 8.0*  AST 18  --  20  --   --   --   ALT 31  --  28  --   --   --   ALKPHOS 42  --  42  --   --   --   BILITOT 0.2*  --  0.4  --   --   --    ------------------------------------------------------------------------------------------------------------------ No results for input(s): CHOL, HDL, LDLCALC, TRIG, CHOLHDL, LDLDIRECT in the last 72 hours.  Lab Results  Component Value Date   HGBA1C 6.1 03/07/2019   ------------------------------------------------------------------------------------------------------------------ No results for input(s): TSH, T4TOTAL, T3FREE, THYROIDAB in the last 72 hours.  Invalid input(s): FREET3 ------------------------------------------------------------------------------------------------------------------ No results for input(s): VITAMINB12, FOLATE, FERRITIN, TIBC, IRON, RETICCTPCT in the last 72 hours.  Coagulation profile No results for input(s): INR, PROTIME in the last 168 hours.  No results for input(s): DDIMER in the last 72 hours.  Cardiac Enzymes No results for input(s): CKMB, TROPONINI, MYOGLOBIN in the last 168 hours.  Invalid input(s): CK ------------------------------------------------------------------------------------------------------------------ No results found for: BNP  Micro Results Recent Results (from the past 240 hour(s))  SARS CORONAVIRUS 2 (TAT 6-24 HRS) Nasopharyngeal Nasopharyngeal Swab     Status: None   Collection  Time: 06/01/19 10:46 PM   Specimen: Nasopharyngeal Swab  Result Value Ref Range Status   SARS Coronavirus 2 NEGATIVE NEGATIVE Final    Comment: (NOTE) SARS-CoV-2 target nucleic acids are NOT DETECTED. The SARS-CoV-2 RNA is generally detectable in upper and lower respiratory specimens during the acute phase of infection. Negative results do not preclude SARS-CoV-2 infection, do not rule out co-infections with other pathogens, and should not be used as the sole basis for treatment or other patient management decisions. Negative results must be combined with clinical observations, patient history, and epidemiological information. The expected result is Negative. Fact Sheet for Patients: SugarRoll.be Fact Sheet for Healthcare Providers: https://www.woods-mathews.com/ This test is not yet approved or cleared by the [place] FDA and  has been authorized for detection and/or diagnosis of SARS-CoV-2 by FDA under an Emergency Use Authorization (EUA). This EUA will remain  in effect (meaning this test can be used) for the duration of the COVID-19 declaration under Section 56 4(b)(1) of the Act, 21 U.S.C. section 360bbb-3(b)(1), unless the authorization is terminated or revoked sooner. Performed at [facility], [place] [address] [place], [place] [address]   Hsv Culture And Typing     Status: Abnormal   Collection Time: 06/02/19  5:48 PM   Specimen: Skin, Other  Result Value Ref Range Status   HSV Culture/Type Comment (A)  Final    Comment: (NOTE) Positive for Herpes simplex virus type-2. Typing was confirmed by monoclonal antibody microscopic immunofluorescence. Performed At: [facility] [place], [place] [id number] Rush Farmer MD DS:[id number]    Source of Sample SKIN  Final    Comment: Performed at [facility], [place] [address], [place], [place] [address]    Radiology Reports US RENAL  Result Date:  05/28/2019 CLINICAL DATA:  Acute kidney injury. EXAM: RENAL / URINARY TRACT ULTRASOUND COMPLETE COMPARISON:  PET CT scan 03/11/2019. FINDINGS: Right Kidney: Renal measurements: 9.9 x 4.3 x 5.0 cm = volume: 109.7 mL . Echogenicity within normal limits. No mass or hydronephrosis visualized. Left Kidney: Renal measurements: 9.6 x 5.4 x 4.9 cm = volume: Is 134.3. mL. Echogenicity within normal limits. No mass or hydronephrosis visualized. Bladder: Appears normal for degree of bladder distention. Other: Small right pleural effusion and gallstones are noted as seen on the prior exam. IMPRESSION: Negative for hydronephrosis.  Normal appearing kidneys. Right pleural effusion. Gallstones. Electronically Signed   By: Inge Rise M.D.   On: 05/28/2019 07:15     Time Spent in minutes  30     Desiree Hane M.D on 06/07/2019 at 3:47 PM  To page go to [email or web address] - password [facility]

## 2019-06-07 NOTE — TOC Progression Note (Signed)
Transition of Care [facility]) - Progression Note    Patient Details  Name: Kayla Price MRN: [id number] Date of Birth: 02-04-1956  Transition of Care [facility]) CM/SW [place], [place] Phone Number: 06/07/2019, 1:24 PM  Clinical Narrative:     Patient's daughter returned call and is informed of patient's  discharge plan. CSW verbally informed her of Medicare.gov website she can visit for reviews and ratings.   Thurmond Butts, MSW, [facility] Clinical Social Worker [phone]   Expected Discharge Plan: Skilled Nursing Facility Barriers to Discharge: SNF Pending bed offer, Insurance Authorization  Expected Discharge Plan and Services Expected Discharge Plan: [place] arrangements for the past 2 months: Single Family Home                                       Social Determinants of Health (SDOH) Interventions    Readmission Risk Interventions No flowsheet data found.

## 2019-06-08 ENCOUNTER — Inpatient Hospital Stay (HOSPITAL_COMMUNITY): Payer: Medicare HMO

## 2019-06-08 DIAGNOSIS — R600 Localized edema: Secondary | ICD-10-CM

## 2019-06-08 DIAGNOSIS — E440 Moderate protein-calorie malnutrition: Secondary | ICD-10-CM

## 2019-06-08 DIAGNOSIS — M7989 Other specified soft tissue disorders: Secondary | ICD-10-CM

## 2019-06-08 LAB — CBC
HCT: 23.8 % — ABNORMAL LOW (ref 36.0–46.0)
Hemoglobin: 7.9 g/dL — ABNORMAL LOW (ref 12.0–15.0)
MCH: 31.1 pg (ref 26.0–34.0)
MCHC: 33.2 g/dL (ref 30.0–36.0)
MCV: 93.7 fL (ref 80.0–100.0)
Platelets: 120 10*3/uL — ABNORMAL LOW (ref 150–400)
RBC: 2.54 MIL/uL — ABNORMAL LOW (ref 3.87–5.11)
RDW: 19.2 % — ABNORMAL HIGH (ref 11.5–15.5)
WBC: 3.9 10*3/uL — ABNORMAL LOW (ref 4.0–10.5)
nRBC: 2.6 % — ABNORMAL HIGH (ref 0.0–0.2)

## 2019-06-08 MED ORDER — ENOXAPARIN SODIUM 100 MG/ML ~~LOC~~ SOLN
90.0000 mg | Freq: Two times a day (BID) | SUBCUTANEOUS | Status: DC
  Administered 2019-06-08 – 2019-06-16 (×17): 90 mg via SUBCUTANEOUS
  Filled 2019-06-08 (×17): qty 1

## 2019-06-08 NOTE — Progress Notes (Addendum)
ANTICOAGULATION CONSULT NOTE - Initial Consult  Pharmacy Consult for Lovenox Indication: DVT  Allergies  Allergen Reactions  . Lisinopril Anaphylaxis, Swelling and Other (See Comments)    Swelling of tongue and mouth 11/05/16- tolerates Olmesartan  . Pepcid [Famotidine] Other (See Comments)    PPI H2, BLOCKERS LOWER GASTRIC PH WHICH WOULD LEAD TO SUBTHERAPEUTIC RILPIVIRINE LEVELS AND POTENTIAL VIROLOGICAL FAILURE WITH RESISTANCE  . Prilosec [Omeprazole] Other (See Comments)    PPI H2, BLOCKERS LOWER GASTRIC PH WHICH WOULD LEAD TO SUBTHERAPEUTIC RILPIVIRINE LEVELS AND POTENTIAL VIROLOGICAL FAILURE WITH RESISTANCE  . Tums [Calcium Carbonate Antacid] Other (See Comments)    TUMS ANTACIDS CAN LOWER GASTRIC PH WHICH COULD  LEAD TO SUBTHERAPEUTIC RILPIVIRINE LEVELS AND POTENTIAL VIROLOGICAL FAILURE WITH RESISTANCE TUMS CAN BE GIVEN BUT NEED CONSULT WITH ID PHARMACY RE TIMING. I PREFER HER TO AVOID ALL TOGETHER    Patient Measurements: Height: 5\' 7"  (170.2 cm) Weight: 198 lb 13.7 oz (90.2 kg) IBW/kg (Calculated) : 61.6  Vital Signs: Temp: 97.9 F (36.6 C) (12/14 0845) Temp Source: Oral (12/14 0845) BP: 104/70 (12/14 0845) Pulse Rate: 96 (12/14 0845)  Labs: Recent Labs    06/06/19 0744  HGB 8.0*  HCT 23.9*  PLT 71*  CREATININE 1.21*    Estimated Creatinine Clearance: 54.8 mL/min (A) (by C-G formula based on SCr of 1.21 mg/dL (H)).   Medical History: Past Medical History:  Diagnosis Date  . AKI (acute kidney injury) ([place]) 06/02/2019  . Alopecia areata 11/28/2009  . Bell's palsy   . Cancer [facility]) 2005   Breast cancer   chemotherapy and radiation  . CKD (chronic kidney disease) stage 3, GFR 30-59 ml/min 06/08/2015  . Dry eye syndrome   . Family history of lung cancer   . Family history of non-Hodgkin's lymphoma   . Fasting hyperglycemia   . Gestational diabetes    2001  . HIP FRACTURE, RIGHT 05/06/2008  . History of kidney stones   . HIV DISEASE 03/27/2006  .  HYPERLIPIDEMIA, MIXED 12/15/2007  . HYPERTENSION 03/27/2006  . 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 MENORRHAGIA, POSTMENOPAUSAL 02/03/2009  . Osteoarthritis of left knee 06/08/2015  . OSTEOARTHROSIS, LOCAL, SCND, UNSPC SITE 04/07/2007  . Personal history of chemotherapy 11/2018  . PVD 04/07/2007  . Tinea capitis   . TRIGGER FINGER 05/06/2008  . Unspecified vitamin D deficiency 08/06/2007    [name] Pharmacy consulted for Lovenox therapy.   Patient has pancytopenia - Hemoglobin is stable at 8. Platelets are stable 70-80 last few days. BMI is 30. SCr is trending down at 1.21 with estimated CrCl ~ 54 mL/min.   Goal of Therapy:  Monitor platelets by anticoagulation protocol: Yes   Plan:  Lovenox 1 mg/kg (90mg ) SQ BID.  Monitor CBC closely with pancytopenia.  Monitor SCr for any need to adjust dose.   Sloan Leiter, PharmD, BCPS, BCCCP Clinical Pharmacist Please refer to [facility] for [place] numbers 06/08/2019,4:06 PM

## 2019-06-08 NOTE — Progress Notes (Addendum)
[facility]  PROGRESS NOTE  BLIMY NAPOLEON NLZ:[id number] DOB: Mar 16, 1956 DOA: 06/01/2019 PCP: Lauree Chandler, NP Admit date - 06/01/2019   Admitting Physician Elwyn Reach, MD  Outpatient Primary MD for the patient is Lauree Chandler, NP  LOS - 5 Brief Narrative   SYNDEY JASKOLSKI is a 63 y.o. 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  In the ED findings notable for blood pressure 88/64, WBC 2.4, platelets 54, creatinine 1.75 (baseline 1.1), lactic acid within normal limits.  Patient was given IV fluids and admitted with working diagnosis of AKI, hypotension, and decubitus ulcerations of buttocks requiring aggressive wound care.  Hospital course complicated by superficial skin breakdown of groin/perineal area consistent with intertrigo/fungal infection with no signs of decubitus ulcers for which wound care saw and recommended appropriate dressings with nystatin powder/Desitin.  Given appearance of lesions for blue staining HSV PCR was sent, returned positive.  Patient was started on Valtrex on 12/11  Subjective  Ms. Matsuura today stable pain control of skin breakdown in groin area.  Denies fevers, chills, cough, shortness of breath, abdominal pain, nausea or vomiting.  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 Lesions look less raw and patient reports much better in terms of pain. HSV-2 PCR of skin returned +  --Valtrex 1 g TID PO x 7 days, d/c fluconazole --f/u with ID as outpt arranged -Keeping this area clean/dry is key, currently has  Foley for urinary retention, will eventually need a voiding trial  Right lower extremity edema, nonpitting.  Unilateral edema in setting of patient with known metastatic cancer.  High risk for clot disease.  Current SCDs given pancytopenia -Obtain venous duplex--Addendum: positive for diffuse DVT, will start xarelto qd after discussion with Dr. Jana Hakim  AKI on CKD, with urinary retention, back to baseline.  Creatinine improving with Foley in place.  Likely acute exacerbation in setting of brief hypotension and continue use of Benicar/also discontinued on recent hospitalization.  Renal ultrasound  on12/3 was negative for hydronephrosis -Avoid nephrotoxins  Hypotension, Stable.  BP range last 24 hours 85/56--105/66.  Decadron discontinued given random cortisol was 19, not consistent with adrenal insufficiency.  Patient previously on Decadron for taper related to malignancy. -Closely monitor BP off steroids -Recommend discontinuation of Benicar on discharge  Hypothyroidism, stable -Continue Synthroid  Physical deconditioning.  Worked with PT on 12/8 and again on 12/10.  Prior level of independence: Independent with assistive devices.  Pain from groin site previously limited ambulation, but much better today in pain control and healing of skin site.  In working with PT today they have noticed improvement in her strength and continue to believe patient would benefit from SNF level rehab upon D/C, social worker consulted to assist -encourage OOB to chair with assistance -PT recommends SNF, I agree SNF rehab would be beneficial  Given her prior level of independence and improvement in her pain that was previously limiting her --updated daughter on dispo on 12/13  HIV, controlled.  CD4 greater than 400 on 10/26 -Continue darunavir -cobicistat, dolutegravir  Recent E. coli UTI.  Diagnosed on previous hospital course. -Completed full course  of cefdinir.  Pancytopenia, likely chemotherapy associated.   Stable.  No signs or symptoms of bleeding -Monitor CBC, SCDs  Metastatic breast cancer, metastases to the brain(04/2019) and right pleural space(03/2018)   Status post whole brain radiation (11/5-11/18/2020 -Followed by Dr. Jana Hakim, plan for outpatient PET scan to determine next step in treatment   Family Communication  :  Updated daughter on plan on 12/14 Code Status :  DNR  Disposition Plan  :Medically stable. PT recommends SNF given need for moderate-max assistance for bed mobility and transfers.  Previous baseline ambulatory with occasional use of cane, pain better controlled today.   Consults  :  Dr. Mariah Milling, ID  Procedures  :  none  DVT Prophylaxis  :   SCDs   Lab Results  Component Value Date   PLT 71 (L) 06/06/2019    Diet :  Diet Order            Diet regular Room service appropriate? Yes; Fluid consistency: Thin  Diet effective now               Inpatient Medications Scheduled Meds: . Chlorhexidine Gluconate Cloth  6 each Topical Daily  . darunavir-cobicistat  1 tablet Oral QHS  . dolutegravir  50 mg Oral QHS   And  . rilpivirine  25 mg Oral QHS  . feeding supplement (ENSURE ENLIVE)  237 mL Oral TID BM  . levothyroxine  200 mcg Oral QAC breakfast  . maraviroc  150 mg Oral BID  . multivitamin with minerals  1 tablet Oral Daily  . valACYclovir  1,000 mg Oral BID   Continuous Infusions:  PRN Meds:.acetaminophen **OR** acetaminophen, calcium carbonate (dosed in mg elemental calcium), camphor-menthol **AND** hydrOXYzine, docusate sodium, HYDROcodone-acetaminophen, ondansetron **OR** ondansetron (ZOFRAN) IV, sodium chloride flush, sorbitol, zolpidem  Antibiotics  :   Anti-infectives (From admission, onward)   Start     Dose/Rate Route Frequency Ordered Stop   06/05/19 2200  valACYclovir (VALTREX) tablet 1,000 mg     1,000 mg Oral 2 times daily 06/05/19 1418 06/12/19 2159   06/05/19 1000  valACYclovir (VALTREX) tablet 1,000 mg  Status:   Discontinued     1,000 mg Oral 3 times daily 06/05/19 0834 06/05/19 1418   06/02/19 2200  darunavir-cobicistat (PREZCOBIX) 800-150 MG per tablet 1 tablet     1 tablet Oral Daily at bedtime 06/02/19 0950     06/02/19 2200  dolutegravir (TIVICAY) tablet 50 mg     50 mg Oral Daily at bedtime 06/02/19 1455     06/02/19 2200  rilpivirine (EDURANT) tablet 25 mg     25 mg Oral Daily at bedtime 06/02/19 1455     06/02/19 1700  dolutegravir (TIVICAY) tablet 50 mg  Status:  Discontinued     50 mg Oral Daily with supper 06/02/19 1453 06/02/19 1456   06/02/19 1700  rilpivirine (EDURANT) tablet 25 mg  Status:  Discontinued     25 mg Oral Daily with supper 06/02/19 1453 06/02/19 1456   06/02/19 1415  fluconazole (DIFLUCAN) IVPB 200 mg  Status:  Discontinued     200 mg 100 mL/hr over 60 Minutes Intravenous Every 24 hours 06/02/19 1402 06/05/19 0834   06/02/19 1100  maraviroc (SELZENTRY) tablet 150 mg     150 mg Oral 2 times daily 06/02/19 0950     06/02/19 1000  cefdinir (OMNICEF) capsule 300 mg     300 mg Oral 2 times daily 06/02/19 0950 06/04/19 2207   06/02/19 1000  fluconazole (  DIFLUCAN) tablet 400 mg  Status:  Discontinued     400 mg Oral Daily 06/02/19 0950 06/02/19 1402       Objective   Vitals:   06/07/19 2227 06/08/19 0500 06/08/19 0657 06/08/19 0845  BP: 99/70  104/68 104/70  Pulse: 95  91 96  Resp: 18  19 18   Temp: 98.2 F (36.8 C)  98 F (36.7 C) 97.9 F (36.6 C)  TempSrc: Oral  Oral Oral  SpO2: 95%  95% 91%  Weight:  90.2 kg    Height:        SpO2: 91 %  Wt Readings from Last 3 Encounters:  06/08/19 90.2 kg  05/31/19 90.7 kg  05/19/19 88 kg     Intake/Output Summary (Last 24 hours) at 06/08/2019 1339 Last data filed at 06/08/2019 0849 Gross per 24 hour  Intake 480 ml  Output 300 ml  Net 180 ml    Physical Exam:  Elderly female lying in bed, no distress Awake Alert, Oriented X 3, Normal affect Normal respiratory effort on room air, no appreciable crackles  or wheezes No new F.N deficits,  [place].AT, Abdomen soft, normal bowel sounds Nonpitting edema of right lower extremity           I have personally reviewed the following:   Data Reviewed:  CBC Recent Labs  Lab 06/01/19 1825 06/01/19 2319 06/02/19 0525 06/04/19 0830 06/05/19 0840 06/06/19 0744  WBC 2.5* 2.4* 2.5* 3.1* 3.2* 3.2*  HGB 8.7* 7.9* 8.2* 8.4* 8.1* 8.0*  HCT 26.5* 24.5* 25.2* 25.3* 24.4* 23.9*  PLT 65* 61* 67* 80* 79* 71*  MCV 95.3 97.6 96.2 94.8 93.8 93.7  MCH 31.3 31.5 31.3 31.5 31.2 31.4  MCHC 32.8 32.2 32.5 33.2 33.2 33.5  RDW 18.1* 18.2* 18.5* 18.4* 18.7* 18.9*  LYMPHSABS 0.5*  --   --   --   --   --   MONOABS 0.2  --   --   --   --   --   EOSABS 0.0  --   --   --   --   --   BASOSABS 0.0  --   --   --   --   --     Chemistries  Recent Labs  Lab 06/01/19 1825 06/01/19 2319 06/02/19 0525 06/04/19 0830 06/05/19 0840 06/06/19 0744  NA 139  --  142 136 132* 131*  K 4.8  --  4.1 4.0 4.0 3.4*  CL 113*  --  112* 107 103 99  CO2 20*  --  20* 20* 22 24  GLUCOSE 129*  --  122* 110* 100* 101*  BUN 39*  --  35* 21 16 13   CREATININE 1.75* 1.68* 1.61* 1.28* 1.17* 1.21*  CALCIUM 8.8*  --  8.4* 8.0* 8.1* 8.0*  AST 18  --  20  --   --   --   ALT 31  --  28  --   --   --   ALKPHOS 42  --  42  --   --   --   BILITOT 0.2*  --  0.4  --   --   --    ------------------------------------------------------------------------------------------------------------------ No results for input(s): CHOL, HDL, LDLCALC, TRIG, CHOLHDL, LDLDIRECT in the last 72 hours.  Lab Results  Component Value Date   HGBA1C 6.1 03/07/2019   ------------------------------------------------------------------------------------------------------------------ No results for input(s): TSH, T4TOTAL, T3FREE, THYROIDAB in the last 72 hours.  Invalid input(s): FREET3 ------------------------------------------------------------------------------------------------------------------ No results  for input(s): VITAMINB12,  FOLATE, FERRITIN, TIBC, IRON, RETICCTPCT in the last 72 hours.  Coagulation profile No results for input(s): INR, PROTIME in the last 168 hours.  No results for input(s): DDIMER in the last 72 hours.  Cardiac Enzymes No results for input(s): CKMB, TROPONINI, MYOGLOBIN in the last 168 hours.  Invalid input(s): CK ------------------------------------------------------------------------------------------------------------------ No results found for: BNP  Micro Results Recent Results (from the past 240 hour(s))  SARS CORONAVIRUS 2 (TAT 6-24 HRS) Nasopharyngeal Nasopharyngeal Swab     Status: None   Collection Time: 06/01/19 10:46 PM   Specimen: Nasopharyngeal Swab  Result Value Ref Range Status   SARS Coronavirus 2 NEGATIVE NEGATIVE Final    Comment: (NOTE) SARS-CoV-2 target nucleic acids are NOT DETECTED. The SARS-CoV-2 RNA is generally detectable in upper and lower respiratory specimens during the acute phase of infection. Negative results do not preclude SARS-CoV-2 infection, do not rule out co-infections with other pathogens, and should not be used as the sole basis for treatment or other patient management decisions. Negative results must be combined with clinical observations, patient history, and epidemiological information. The expected result is Negative. Fact Sheet for Patients: SugarRoll.be Fact Sheet for Healthcare Providers: https://www.woods-mathews.com/ This test is not yet approved or cleared by the [place] FDA and  has been authorized for detection and/or diagnosis of SARS-CoV-2 by FDA under an Emergency Use Authorization (EUA). This EUA will remain  in effect (meaning this test can be used) for the duration of the COVID-19 declaration under Section 56 4(b)(1) of the Act, 21 U.S.C. section 360bbb-3(b)(1), unless the authorization is terminated or revoked sooner. Performed at [facility], [place] [address], [place], [place] [address]   Hsv Culture And Typing     Status: Abnormal   Collection Time: 06/02/19  5:48 PM   Specimen: Skin, Other  Result Value Ref Range Status   HSV Culture/Type Comment (A)  Final    Comment: (NOTE) Positive for Herpes simplex virus type-2. Typing was confirmed by monoclonal antibody microscopic immunofluorescence. Performed At: [facility] [place], [place] [id number] Rush Farmer MD XB:[id number]    Source of Sample SKIN  Final    Comment: Performed at [facility], [place] [address], [place], [place] [address]    Radiology Reports US RENAL  Result Date: 05/28/2019 CLINICAL DATA:  Acute kidney injury. EXAM: RENAL / URINARY TRACT ULTRASOUND COMPLETE COMPARISON:  PET CT scan 03/11/2019. FINDINGS: Right Kidney: Renal measurements: 9.9 x 4.3 x 5.0 cm = volume: 109.7 mL . Echogenicity within normal limits. No mass or hydronephrosis visualized. Left Kidney: Renal measurements: 9.6 x 5.4 x 4.9 cm = volume: Is 134.3. mL. Echogenicity within normal limits. No mass or hydronephrosis visualized. Bladder: Appears normal for degree of bladder distention. Other: Small right pleural effusion and gallstones are noted as seen on the prior exam. IMPRESSION: Negative for hydronephrosis.  Normal appearing kidneys. Right pleural effusion. Gallstones. Electronically Signed   By: Inge Rise M.D.   On: 05/28/2019 07:15     Time Spent in minutes  30     Desiree Hane M.D on 06/08/2019 at 1:39 PM  To page go to [email or web address] - password [facility]

## 2019-06-08 NOTE — Progress Notes (Signed)
VASCULAR LAB PRELIMINARY  PRELIMINARY  PRELIMINARY  PRELIMINARY  Right lower extremity venous duplex completed.    Preliminary report:  See CV proc for preliminary results.  Gave Dr. Marye Round and Mickel Baas, RN results.  Anaiya Wisinski, RVT 06/08/2019, 6:38 PM

## 2019-06-08 NOTE — Plan of Care (Signed)
  Problem: Nutrition: Goal: Adequate nutrition will be maintained Outcome: Progressing   

## 2019-06-09 ENCOUNTER — Inpatient Hospital Stay (HOSPITAL_COMMUNITY): Payer: Medicare HMO

## 2019-06-09 ENCOUNTER — Ambulatory Visit (HOSPITAL_COMMUNITY): Payer: Medicare HMO

## 2019-06-09 DIAGNOSIS — I82401 Acute embolism and thrombosis of unspecified deep veins of right lower extremity: Secondary | ICD-10-CM

## 2019-06-09 DIAGNOSIS — J189 Pneumonia, unspecified organism: Secondary | ICD-10-CM

## 2019-06-09 DIAGNOSIS — L8990 Pressure ulcer of unspecified site, unspecified stage: Secondary | ICD-10-CM | POA: Insufficient documentation

## 2019-06-09 DIAGNOSIS — J9601 Acute respiratory failure with hypoxia: Secondary | ICD-10-CM

## 2019-06-09 DIAGNOSIS — R0902 Hypoxemia: Secondary | ICD-10-CM

## 2019-06-09 LAB — BASIC METABOLIC PANEL
Anion gap: 8 (ref 5–15)
BUN: 10 mg/dL (ref 8–23)
CO2: 26 mmol/L (ref 22–32)
Calcium: 8.6 mg/dL — ABNORMAL LOW (ref 8.9–10.3)
Chloride: 101 mmol/L (ref 98–111)
Creatinine, Ser: 1.09 mg/dL — ABNORMAL HIGH (ref 0.44–1.00)
GFR calc Af Amer: >60 mL/min (ref >60)
GFR calc non Af Amer: 54 mL/min — ABNORMAL LOW (ref >60)
Glucose, Bld: 105 mg/dL — ABNORMAL HIGH (ref 70–99)
Potassium: 4 mmol/L (ref 3.5–5.1)
Sodium: 135 mmol/L (ref 135–145)

## 2019-06-09 LAB — CBC
HCT: 23.7 % — ABNORMAL LOW (ref 36.0–46.0)
Hemoglobin: 7.9 g/dL — ABNORMAL LOW (ref 12.0–15.0)
MCH: 31.3 pg (ref 26.0–34.0)
MCHC: 33.3 g/dL (ref 30.0–36.0)
MCV: 94 fL (ref 80.0–100.0)
Platelets: 123 10*3/uL — ABNORMAL LOW (ref 150–400)
RBC: 2.52 MIL/uL — ABNORMAL LOW (ref 3.87–5.11)
RDW: 19.3 % — ABNORMAL HIGH (ref 11.5–15.5)
WBC: 4.1 10*3/uL (ref 4.0–10.5)
nRBC: 1.5 % — ABNORMAL HIGH (ref 0.0–0.2)

## 2019-06-09 MED ORDER — SODIUM CHLORIDE 0.9 % IV SOLN
1.0000 g | INTRAVENOUS | Status: DC
  Administered 2019-06-10 – 2019-06-12 (×3): 1 g via INTRAVENOUS
  Filled 2019-06-09 (×3): qty 1

## 2019-06-09 MED ORDER — SODIUM CHLORIDE 0.9 % IV SOLN
100.0000 mg | Freq: Two times a day (BID) | INTRAVENOUS | Status: DC
  Administered 2019-06-09 – 2019-06-12 (×7): 100 mg via INTRAVENOUS
  Filled 2019-06-09 (×8): qty 100

## 2019-06-09 MED ORDER — SODIUM CHLORIDE 0.9 % IV SOLN
1.0000 g | Freq: Once | INTRAVENOUS | Status: AC
  Administered 2019-06-09: 1 g via INTRAVENOUS
  Filled 2019-06-09: qty 1

## 2019-06-09 MED ORDER — IOHEXOL 350 MG/ML SOLN
80.0000 mL | Freq: Once | INTRAVENOUS | Status: AC | PRN
  Administered 2019-06-09: 80 mL via INTRAVENOUS

## 2019-06-09 NOTE — Progress Notes (Signed)
HIV resistance history:  HIVdb: Genotypic Resistance Interpretation Algorithm  Date: 27-Oct-2014 17:15:11 UTC   Drug Resistance Interpretation: PR PI Major Resistance Mutations:          None PI Minor Resistance Mutations:          None Other Mutations:         V77I Protease Inhibitors atazanavir/r (ATV/r)    Susceptible darunavir/r (DRV/r)     Susceptible fosamprenavir/r (FPV/r)          Susceptible indinavir/r (IDV/r)         Susceptible lopinavir/r (LPV/r)        Susceptible nelfinavir (NFV)           Susceptible saquinavir/r (SQV/r)    Susceptible tipranavir/r (TPV/r)      Susceptible PR Comments  Drug Resistance Interpretation: RT NRTI Resistance Mutations:   M41L, D67N, M184V, L210W, T215Y NNRTI Resistance Mutations:            None Other Mutations:         E44D, V118I Nucleoside RTI lamivudine (3TC)        High-level resistance abacavir (ABC)           High-level resistance zidovudine (AZT)        High-level resistance stavudine (D4T)          High-level resistance didanosine (DDI)         High-level resistance emtricitabine (FTC)     High-level resistance tenofovir (TDF)            High-level resistance Non-Nucleoside RTI efavirenz (EFV)           Susceptible etravirine (ETR)          Susceptible nevirapine (NVP)        Susceptible rilpivirine (RPV)           Susceptible RT Comments NRTI M41L is a TAM that usually occurs with T215Y. Together, M41L and T215Y confer high-level resistance to AZT and d4T and intermediate-level resistance to ddI, ABC and TDF. However, viruses with M41L + T215Y + M184V will exhibit intermediate-level resistance to AZT and d4T and low-level resistance to TDF. D67N is a nonpolymorphic TAM associated with low-level resistance to AZT and d4T. When present with other TAMs, it reduces susceptibility to ABC, TDF and ddI. M184V/I cause high-level resistance to 3TC and FTC and low-level resistance to ddI and ABC. However, M184V/I are not  contraindications to continued treatment with 3TC or FTC because they increase susceptibility to AZT, TDF and d4T and are associated with clinically significant reductions in HIV-1 replication. In combination with K101E or E138K, M184I synergistically reduces RPV susceptibility. L210W usually occurs in combination with M41L and T215Y. The combination of M41, L210W and T215Y causes high-level resistance to AZT and d4T and intermediate to high-level resistance to ddI, ABC and TDF. T215Y is a TAM which causes intermediate/high-level resistance to AZT and d4T and low-level resistance to ABC, ddI, and TDF. Other E44A/D are minimally polymorphic accessory NRTI-resistance mutations that usually occur with multiple TAMs. V118I is a polymorphic accessory NRTI-resistance mutation that occurs in combination with multiple TAMs.

## 2019-06-09 NOTE — Progress Notes (Signed)
Nutrition Follow up  DOCUMENTATION CODES:   Non-severe (moderate) malnutrition in context of chronic illness  INTERVENTION:   -Continue Ensure Enlive po TID, each supplement provides 350 kcal and 20 grams of protein -Continue MVI with minerals  NUTRITION DIAGNOSIS:   Moderate Malnutrition related to chronic illness(metastatic breast cancer) as evidenced by mild fat depletion, severe muscle depletion, mild muscle depletion, percent weight loss.  Ongoing  GOAL:   Patient will meet greater than or equal to 90% of their needs   Not meeting   MONITOR:   PO intake, Supplement acceptance, Labs, Weight trends  REASON FOR ASSESSMENT:   Consult Assessment of nutrition requirement/status  ASSESSMENT:   63 year old patient admitted with weakness and wound that could not be managed at home. PMH of metastatic breast cancer on chemo, HIV, HLD, HTN, AKI, anemia of chronic disease and CKD stage 3. Pt found to have moisture associated skin breakdown.   RD working remotely.  Spoke with pt via phone. Appetite progressing slowly. States she can finish half of each meal. Meal completions charted as 25-50% for her last 5 meals. States she is drinking Ensure TID. MAR shows pt declined Ensure this am. Discussed the importance of protein intake for preservation of lean body mass and promote wound healing.    Admission weight: 90.7 kg  Current weight: 90.2 kg    I/O: +517 ml since admit  UOP: 795 ml x 24 hrs    Medications: MVI with minerals Labs: CBG 100-105   Diet Order:   Diet Order            Diet regular Room service appropriate? Yes; Fluid consistency: Thin  Diet effective now              EDUCATION NEEDS:   Education needs have been addressed  Skin:  Skin Assessment: Skin Integrity Issues: Skin Integrity Issues:: Other (Comment), Stage II Stage II: buttocks x2 Other: non-pressure wound- R/L groin  Last BM:  12/15  Height:   Ht Readings from Last 1 Encounters:   06/01/19 5\' 7"  (1.702 m)    Weight:   Wt Readings from Last 1 Encounters:  06/08/19 90.2 kg    Ideal Body Weight:  61.4 kg  BMI:  Body mass index is 31.15 kg/m.  Estimated Nutritional Needs:   Kcal:  2000-2200  Protein:  100-110  Fluid:  > 2L  Mariana Single RD, LDN Clinical Nutrition Pager # [phone]

## 2019-06-09 NOTE — Progress Notes (Signed)
Spoke with this patient's nurse and provided education to him that is was okay to hook up to this patient's port. However, when the infusion is complete he should place a consult for IV team to disconnect

## 2019-06-09 NOTE — Progress Notes (Addendum)
[facility]  PROGRESS NOTE  [name]:[id number] DOB: [birth year] DOA: 06/01/2019 PCP: Lauree Chandler, NP Admit date - 06/01/2019   Admitting Physician Elwyn Reach, MD  Outpatient Primary MD for the patient is Lauree Chandler, NP  LOS - 6 Brief Narrative   Kayla Price is a 63 y.o. 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  In the ED findings notable for blood pressure 88/64, WBC 2.4, platelets 54, creatinine 1.75 (baseline 1.1), lactic acid within normal limits.  Patient was given IV fluids and admitted with working diagnosis of AKI, hypotension, and decubitus ulcerations of buttocks requiring aggressive wound care.  Hospital course complicated by superficial skin breakdown of groin/perineal area consistent with intertrigo/fungal infection with no signs of decubitus ulcers for which wound care saw and recommended appropriate dressings with nystatin powder/Desitin.  Given appearance of lesions for blue staining HSV PCR was sent, returned positive.  Patient was started on Valtrex on 12/11.  Patient started on empiric IV ceftriaxone doxycycline for presumed community-acquired pneumonia given chest x-ray findings, patient started on Lovenox for extensive right-sided DVT on 12/14.  Currently awaiting CTA to rule out PE given O2 requirements.  Subjective  Ms. Muro today reports improvement in shortness of breath since being started on oxygen yesterday.  Denies any chest pain.  No hemoptysis.  Tolerating Lovenox.  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 Lesions look less raw and patient reports much better in terms of pain. HSV-2 PCR of skin returned +  --Valtrex 1 g TID PO x 7 days, d/c fluconazole --f/u with ID as outpt arranged -Keeping this area clean/dry is key, currently has Foley for urinary retention, will eventually need a voiding trial  Right lower extremity DVT.  Unilateral edema in setting of patient with known metastatic cancer.  Venous duplex positive for DVT -Initial plan to start Xarelto, but has interactions with HIV medications, so started on lovenox -In the interim we will continue Lovenox -Continue to closely monitor CBC given pancytopenia, no signs or symptoms of bleeding -Plan to obtain CTA given O2 requirements  Acute hypoxic respiratory failure secondary to CAP.  CXR demonstrated on 12/14 consistent with pneumonia.  Started on ceftriaxone and doxycycline (azithromycin interacts with injury medications).  Remains afebrile, no leukocytosis, blood pressure stable. -Continue IV ceftriaxone, doxycycline -Obtain blood cultures, pending -Strep pneumo S productive -Obtain a CTA to rule out PE given new diagnosis of DVT with O2 requirements  AKI on CKD, with urinary retention, back to baseline.  Creatinine improving with Foley in place.  Likely acute exacerbation in setting of brief hypotension and continue use of Benicar/also discontinued on recent hospitalization.  Renal ultrasound  on12/3 was negative for hydronephrosis -Avoid nephrotoxins  Hypotension, Stable .  BP range last 24 hours 85/56--105/66.  Decadron discontinued given random cortisol was 19, not consistent with adrenal insufficiency.  Patient previously on Decadron for taper related to malignancy. -Closely monitor BP off steroids -Recommend discontinuation of Benicar on discharge  Hypothyroidism, stable -Continue Synthroid  Physical deconditioning.  Worked with PT on 12/8 and again on 12/10.  Prior level of independence: Independent  with  assistive devices.  Pain from groin site previously limited ambulation, but much better today in pain control and healing of skin site.  In working with PT today they have noticed improvement in her strength and continue to believe patient would benefit from SNF level rehab upon D/C, social worker consulted to assist -encourage OOB to chair with assistance -PT recommends SNF, I agree SNF rehab would be beneficial  Given her prior level of independence and improvement in her pain that was previously limiting her --updated daughter on dispo on 12/13  HIV, controlled.  CD4 greater than 400 on 10/26 -Continue darunavir -cobicistat, dolutegravir -Discuss with ID if able to change medications to decrease interactions with potential anticoagulation  Recent E. coli UTI.  Diagnosed on previous hospital course. -Completed full course of cefdinir.  Pancytopenia, likely chemotherapy associated.  Stable.  No signs or symptoms of bleeding -Monitor CBC, SCDs  Metastatic breast cancer, metastases to the brain(04/2019) and right pleural space(03/2018)   Status post whole brain radiation (11/5-11/18/2020 -Followed by Dr. Jana Hakim, plan for outpatient PET scan to determine next step in treatment   Family Communication  :  Updated daughter on plan on 12/14, called daughter twice on 12/15 to update on plan on 12/15, left a voice message Code Status :  DNR  Disposition Plan  : Requiring IV antibiotics for recently diagnosed, needs CTA to rule out PE given O2 requirements in setting of recently diagnosed DVT, wean O2 as able.  PT recommends SNF given need for moderate-max assistance for bed mobility and transfers.  Previous baseline ambulatory with occasional use of cane, pain better controlled today.   Consults  :  Dr. Mariah Milling, ID  Procedures  :  none  DVT Prophylaxis  :   SCDs   Lab Results  Component Value Date   PLT 123 (L) 06/09/2019    Diet :  Diet Order            Diet regular Room  service appropriate? Yes; Fluid consistency: Thin  Diet effective now               Inpatient Medications Scheduled Meds: . Chlorhexidine Gluconate Cloth  6 each Topical Daily  . darunavir-cobicistat  1 tablet Oral QHS  . dolutegravir  50 mg Oral QHS   And  . rilpivirine  25 mg Oral QHS  . enoxaparin (LOVENOX) injection  90 mg Subcutaneous Q12H  . feeding supplement (ENSURE ENLIVE)  237 mL Oral TID BM  . levothyroxine  200 mcg Oral QAC breakfast  . maraviroc  150 mg Oral BID  . multivitamin with minerals  1 tablet Oral Daily  . valACYclovir  1,000 mg Oral BID   Continuous Infusions: . [START ON 06/10/2019] cefTRIAXone (ROCEPHIN)  IV    . doxycycline (VIBRAMYCIN) IV 100 mg (06/09/19 1046)   PRN Meds:.acetaminophen **OR** acetaminophen, calcium carbonate (dosed in mg elemental calcium), camphor-menthol **AND** hydrOXYzine, docusate sodium, HYDROcodone-acetaminophen, ondansetron **OR** ondansetron (ZOFRAN) IV, sodium chloride flush, sorbitol, zolpidem  Antibiotics  :   Anti-infectives (From admission, onward)   Start     Dose/Rate Route Frequency Ordered Stop   06/10/19 0600  cefTRIAXone (ROCEPHIN) 1 g in sodium chloride 0.9 % 100 mL IVPB     1 g 200 mL/hr over 30 Minutes Intravenous Every 24 hours 06/09/19 0732     06/09/19 0800  doxycycline (VIBRAMYCIN) 100 mg in sodium chloride 0.9 % 250 mL IVPB     100 mg 125 mL/hr over 120 Minutes Intravenous  Every 12 hours 06/09/19 0735     06/09/19 0745  cefTRIAXone (ROCEPHIN) 1 g in sodium chloride 0.9 % 100 mL IVPB     1 g 200 mL/hr over 30 Minutes Intravenous  Once 06/09/19 0738 06/09/19 0924   06/05/19 2200  valACYclovir (VALTREX) tablet 1,000 mg     1,000 mg Oral 2 times daily 06/05/19 1418 06/12/19 2159   06/05/19 1000  valACYclovir (VALTREX) tablet 1,000 mg  Status:  Discontinued     1,000 mg Oral 3 times daily 06/05/19 0834 06/05/19 1418   06/02/19 2200  darunavir-cobicistat (PREZCOBIX) 800-150 MG per tablet 1 tablet     1  tablet Oral Daily at bedtime 06/02/19 0950     06/02/19 2200  dolutegravir (TIVICAY) tablet 50 mg     50 mg Oral Daily at bedtime 06/02/19 1455     06/02/19 2200  rilpivirine (EDURANT) tablet 25 mg     25 mg Oral Daily at bedtime 06/02/19 1455     06/02/19 1700  dolutegravir (TIVICAY) tablet 50 mg  Status:  Discontinued     50 mg Oral Daily with supper 06/02/19 1453 06/02/19 1456   06/02/19 1700  rilpivirine (EDURANT) tablet 25 mg  Status:  Discontinued     25 mg Oral Daily with supper 06/02/19 1453 06/02/19 1456   06/02/19 1415  fluconazole (DIFLUCAN) IVPB 200 mg  Status:  Discontinued     200 mg 100 mL/hr over 60 Minutes Intravenous Every 24 hours 06/02/19 1402 06/05/19 0834   06/02/19 1100  maraviroc (SELZENTRY) tablet 150 mg     150 mg Oral 2 times daily 06/02/19 0950     06/02/19 1000  cefdinir (OMNICEF) capsule 300 mg     300 mg Oral 2 times daily 06/02/19 0950 06/04/19 2207   06/02/19 1000  fluconazole (DIFLUCAN) tablet 400 mg  Status:  Discontinued     400 mg Oral Daily 06/02/19 0950 06/02/19 1402       Objective   Vitals:   06/08/19 2058 06/08/19 2100 06/09/19 0512 06/09/19 0909  BP: 95/66  102/62 114/64  Pulse: 97  92 92  Resp: (!) 24 20 20 18   Temp: 99.7 F (37.6 C)  99 F (37.2 C) 98.3 F (36.8 C)  TempSrc: Oral  Oral Oral  SpO2: 97%  98% 98%  Weight:      Height:        SpO2: 98 % O2 Flow Rate (L/min): 3 L/min  Wt Readings from Last 3 Encounters:  06/08/19 90.2 kg  05/31/19 90.7 kg  05/19/19 88 kg     Intake/Output Summary (Last 24 hours) at 06/09/2019 1448 Last data filed at 06/09/2019 0800 Gross per 24 hour  Intake 480 ml  Output 945 ml  Net -465 ml    Physical Exam:  Elderly female lying in bed, no distress Awake Alert, Oriented X 3, Normal affect No respiratory distress, on 2 L nasal cannula, noticeably dyspneic with conversation no appreciable crackles or wheezes No new F.N deficits,  [place].AT, Abdomen soft, normal bowel  sounds Nonpitting edema of right lower extremity           I have personally reviewed the following:   Data Reviewed:  CBC Recent Labs  Lab 06/04/19 0830 06/05/19 0840 06/06/19 0744 06/08/19 2104 06/09/19 0423  WBC 3.1* 3.2* 3.2* 3.9* 4.1  HGB 8.4* 8.1* 8.0* 7.9* 7.9*  HCT 25.3* 24.4* 23.9* 23.8* 23.7*  PLT 80* 79* 71* 120* 123*  MCV 94.8 93.8 93.7  93.7 94.0  MCH 31.5 31.2 31.4 31.1 31.3  MCHC 33.2 33.2 33.5 33.2 33.3  RDW 18.4* 18.7* 18.9* 19.2* 19.3*    Chemistries  Recent Labs  Lab 06/04/19 0830 06/05/19 0840 06/06/19 0744 06/09/19 0742  NA 136 132* 131* 135  K 4.0 4.0 3.4* 4.0  CL 107 103 99 101  CO2 20* 22 24 26   GLUCOSE 110* 100* 101* 105*  BUN 21 16 13 10   CREATININE 1.28* 1.17* 1.21* 1.09*  CALCIUM 8.0* 8.1* 8.0* 8.6*   ------------------------------------------------------------------------------------------------------------------ No results for input(s): CHOL, HDL, LDLCALC, TRIG, CHOLHDL, LDLDIRECT in the last 72 hours.  Lab Results  Component Value Date   HGBA1C 6.1 03/07/2019   ------------------------------------------------------------------------------------------------------------------ No results for input(s): TSH, T4TOTAL, T3FREE, THYROIDAB in the last 72 hours.  Invalid input(s): FREET3 ------------------------------------------------------------------------------------------------------------------ No results for input(s): VITAMINB12, FOLATE, FERRITIN, TIBC, IRON, RETICCTPCT in the last 72 hours.  Coagulation profile No results for input(s): INR, PROTIME in the last 168 hours.  No results for input(s): DDIMER in the last 72 hours.  Cardiac Enzymes No results for input(s): CKMB, TROPONINI, MYOGLOBIN in the last 168 hours.  Invalid input(s): CK ------------------------------------------------------------------------------------------------------------------ No results found for: BNP  Micro Results Recent Results (from the  past 240 hour(s))  SARS CORONAVIRUS 2 (TAT 6-24 HRS) Nasopharyngeal Nasopharyngeal Swab     Status: None   Collection Time: 06/01/19 10:46 PM   Specimen: Nasopharyngeal Swab  Result Value Ref Range Status   SARS Coronavirus 2 NEGATIVE NEGATIVE Final    Comment: (NOTE) SARS-CoV-2 target nucleic acids are NOT DETECTED. The SARS-CoV-2 RNA is generally detectable in upper and lower respiratory specimens during the acute phase of infection. Negative results do not preclude SARS-CoV-2 infection, do not rule out co-infections with other pathogens, and should not be used as the sole basis for treatment or other patient management decisions. Negative results must be combined with clinical observations, patient history, and epidemiological information. The expected result is Negative. Fact Sheet for Patients: SugarRoll.be Fact Sheet for Healthcare Providers: https://www.woods-mathews.com/ This test is not yet approved or cleared by the [place] FDA and  has been authorized for detection and/or diagnosis of SARS-CoV-2 by FDA under an Emergency Use Authorization (EUA). This EUA will remain  in effect (meaning this test can be used) for the duration of the COVID-19 declaration under Section 56 4(b)(1) of the Act, 21 U.S.C. section 360bbb-3(b)(1), unless the authorization is terminated or revoked sooner. Performed at [facility], [place] [address], [place], [place] [address]   Hsv Culture And Typing     Status: Abnormal   Collection Time: 06/02/19  5:48 PM   Specimen: Skin, Other  Result Value Ref Range Status   HSV Culture/Type Comment (A)  Final    Comment: (NOTE) Positive for Herpes simplex virus type-2. Typing was confirmed by monoclonal antibody microscopic immunofluorescence. Performed At: [facility] [place], [place] [id number] Rush Farmer MD WV:[id number]    Source of Sample SKIN  Final    Comment:  Performed at [facility], [place] [address], [place], [place] [address]    Radiology Reports US RENAL  Result Date: 05/28/2019 CLINICAL DATA:  Acute kidney injury. EXAM: RENAL / URINARY TRACT ULTRASOUND COMPLETE COMPARISON:  PET CT scan 03/11/2019. FINDINGS: Right Kidney: Renal measurements: 9.9 x 4.3 x 5.0 cm = volume: 109.7 mL . Echogenicity within normal limits. No mass or hydronephrosis visualized. Left Kidney: Renal measurements: 9.6 x 5.4 x 4.9 cm = volume: Is 134.3.  mL. Echogenicity within normal limits. No mass or hydronephrosis visualized. Bladder: Appears normal for degree of bladder distention. Other: Small right pleural effusion and gallstones are noted as seen on the prior exam. IMPRESSION: Negative for hydronephrosis.  Normal appearing kidneys. Right pleural effusion. Gallstones. Electronically Signed   By: Inge Rise M.D.   On: 05/28/2019 07:15   DG CHEST PORT 1 VIEW  Result Date: 06/08/2019 CLINICAL DATA:  Hypoxia EXAM: PORTABLE CHEST 1 VIEW COMPARISON:  07/10/2018 FINDINGS: Right Port-A-Cath in place with the tip in the SVC. Airspace disease in the right lower lung concerning for pneumonia. No confluent opacity on the left. Heart is borderline in size. Possible small right effusion. No acute bony abnormality. IMPRESSION: Right lower lung airspace disease concerning for pneumonia. Small right effusion suspected. Electronically Signed   By: Rolm Baptise M.D.   On: 06/08/2019 18:55   VAS [place] LOWER EXTREMITY VENOUS (DVT)  Result Date: 06/09/2019  Lower Venous Study Indications: Swelling.  Risk Factors: Cancer History of metastatic breast cancer. Comparison Study: No prior study on file Performing Technologist: Sharion Dove RVS  Examination Guidelines: A complete evaluation includes B-mode imaging, spectral Doppler, color Doppler, and power Doppler as needed of all accessible portions of each vessel. Bilateral testing is considered an integral part of a complete examination.  Limited examinations for reoccurring indications may be performed as noted.  +---------+---------------+---------+-----------+----------+--------------+ RIGHT    CompressibilityPhasicitySpontaneityPropertiesThrombus Aging +---------+---------------+---------+-----------+----------+--------------+ CFV      Partial        Yes      Yes                  Acute          +---------+---------------+---------+-----------+----------+--------------+ SFJ      Full                                                        +---------+---------------+---------+-----------+----------+--------------+ FV Prox  None                                         Acute          +---------+---------------+---------+-----------+----------+--------------+ FV Mid   None                                         Acute          +---------+---------------+---------+-----------+----------+--------------+ FV DistalNone                                         Acute          +---------+---------------+---------+-----------+----------+--------------+ PFV      Full                                                        +---------+---------------+---------+-----------+----------+--------------+ POP      None  No       No                   Acute          +---------+---------------+---------+-----------+----------+--------------+ PTV      None                                         Acute          +---------+---------------+---------+-----------+----------+--------------+ PERO     None                                         Acute          +---------+---------------+---------+-----------+----------+--------------+ EIV                     Yes      Yes                                 +---------+---------------+---------+-----------+----------+--------------+   +----+---------------+---------+-----------+----------+--------------+  LEFTCompressibilityPhasicitySpontaneityPropertiesThrombus Aging +----+---------------+---------+-----------+----------+--------------+ CFV Full           Yes      Yes                                 +----+---------------+---------+-----------+----------+--------------+     Summary: Right: Findings consistent with acute deep vein thrombosis involving the right common femoral vein, right femoral vein, right popliteal vein, right posterior tibial veins, and right peroneal veins. Left: No evidence of common femoral vein obstruction.  *See table(s) above for measurements and observations. Electronically signed by Harold Barban MD on 06/09/2019 at 8:27:06 AM.    Final      Time Spent in minutes  30     Desiree Hane M.D on 06/09/2019 at 2:48 PM  To page go to [email or web address] - password [facility]

## 2019-06-09 NOTE — Progress Notes (Signed)
HEMATOLOGY-ONCOLOGY PROGRESS NOTE  SUBJECTIVE: Kayla Price was readmitted to the hospital on 06/01/2019 due to weakness and worsening ulcer.  Family indicated they could no longer take care of her at home.  She is currently awaiting SNF placement.  She has had persistent lower extremity edema and was found to have a right lower extremity acute DVT.  She is currently on Lovenox.  She also had a chest x-ray performed yesterday due to hypoxia which showed right lower lung airspace disease concerning for pneumonia.  She has been started on ceftriaxone and doxycycline.  She reports her breathing is better on oxygen.  She has no other complaints today.  Oncology History  Malignant neoplasm of upper-outer quadrant of right breast in female, estrogen receptor negative ([place])  03/2004 Surgery   Right lumpectomy and SLNB for IDC, 0.5cm, 1/2 + SLN, grade 3, ER-, PR-, HER-2 -.  Treated with Doxorubicin and Cyclophosphamide x 4, followed by weekly Paclitaxel x 7 and adjuvant radiation.    10/01/2016 Initial Biopsy   Right breast upper outer quadrant biopsy: IDC, DCIS, triple negative, grade 3. Lymph node: Positive   11/05/2016 Surgery   Right modified radical mastectomy: IDC, grade 3, 2.6 cm, +LVI, triple negative, margins negative, 2/10 LN + metastases.  T2, N1a   11/20/2016 - 03/11/2017 Adjuvant Chemotherapy   Gemcitabine/Carboplatin given on days 1 and 8 on a 21 day cycle x 6 cycles   07/15/2018 - 07/15/2018 Chemotherapy   The patient had pembrolizumab (KEYTRUDA) 200 mg in sodium chloride 0.9 % 50 mL chemo infusion, 200 mg, Intravenous, Once, 0 of 6 cycles  for chemotherapy treatment.    07/15/2018 - 01/01/2019 Chemotherapy   The patient had atezolizumab (TECENTRIQ) 840 mg in sodium chloride 0.9 % 250 mL chemo infusion, 840 mg, Intravenous, Once, 6 of 8 cycles Dose modification: 1,680 mg (original dose 840 mg, Cycle 4, Reason: Provider Judgment), 840 mg (original dose 840 mg, Cycle 4, Reason: Provider  Judgment) Administration: 840 mg (07/15/2018), 840 mg (08/01/2018), 840 mg (08/15/2018), 840 mg (08/29/2018), 840 mg (09/12/2018), 840 mg (09/26/2018), 840 mg (10/10/2018), 840 mg (10/24/2018), 1,680 mg (11/07/2018), 1,680 mg (12/05/2018)  for chemotherapy treatment.    12/19/2018 -  Chemotherapy   The patient had pegfilgrastim (NEULASTA) injection 6 mg, 6 mg, Subcutaneous, Once, 1 of 1 cycle Administration: 6 mg (03/28/2019) pegfilgrastim (NEULASTA ONPRO KIT) injection 6 mg, 6 mg, Subcutaneous, Once, 4 of 8 cycles Administration: 6 mg (01/09/2019), 6 mg (01/23/2019), 6 mg (02/06/2019), 6 mg (02/20/2019), 6 mg (04/03/2019), 6 mg (04/17/2019) eriBULin mesylate (HALAVEN) 2.75 mg in sodium chloride 0.9 % 100 mL chemo infusion, 1.2 mg/m2 = 2.75 mg (100 % of original dose 1.2 mg/m2), Intravenous,  Once, 5 of 9 cycles Dose modification: 1.2 mg/m2 (original dose 1.2 mg/m2, Cycle 1, Reason: Provider Judgment) Administration: 2.75 mg (12/19/2018), 2.75 mg (01/09/2019), 2.75 mg (01/23/2019), 2.75 mg (02/06/2019), 2.75 mg (02/20/2019), 2.75 mg (03/06/2019), 2.75 mg (03/25/2019), 2.75 mg (04/03/2019), 2.75 mg (04/17/2019)  for chemotherapy treatment.    01/02/2019 - 01/02/2019 Chemotherapy   The patient had atezolizumab (TECENTRIQ) 1,200 mg in sodium chloride 0.9 % 250 mL chemo infusion, 1,200 mg, Intravenous, Once, 0 of 6 cycles  for chemotherapy treatment.    Metastatic breast cancer ([place])  11/05/2016 Initial Diagnosis   Recurrent cancer of right breast ([place])   07/15/2018 - 01/01/2019 Chemotherapy   The patient had atezolizumab (TECENTRIQ) 840 mg in sodium chloride 0.9 % 250 mL chemo infusion, 840 mg, Intravenous, Once, 5 of 8 cycles Dose  modification: 1,680 mg (original dose 840 mg, Cycle 4, Reason: Provider Judgment), 840 mg (original dose 840 mg, Cycle 4, Reason: Provider Judgment) Administration: 840 mg (07/15/2018), 840 mg (08/01/2018), 840 mg (08/15/2018), 840 mg (08/29/2018), 840 mg (09/12/2018), 840 mg (09/26/2018), 840 mg (10/10/2018),  840 mg (10/24/2018), 1,680 mg (11/07/2018)  for chemotherapy treatment.    12/19/2018 -  Chemotherapy   The patient had pegfilgrastim (NEULASTA) injection 6 mg, 6 mg, Subcutaneous, Once, 1 of 1 cycle Administration: 6 mg (03/28/2019) pegfilgrastim (NEULASTA ONPRO KIT) injection 6 mg, 6 mg, Subcutaneous, Once, 4 of 8 cycles Administration: 6 mg (01/09/2019), 6 mg (01/23/2019), 6 mg (02/06/2019), 6 mg (02/20/2019), 6 mg (04/03/2019), 6 mg (04/17/2019) eriBULin mesylate (HALAVEN) 2.75 mg in sodium chloride 0.9 % 100 mL chemo infusion, 1.2 mg/m2 = 2.75 mg (100 % of original dose 1.2 mg/m2), Intravenous,  Once, 5 of 9 cycles Dose modification: 1.2 mg/m2 (original dose 1.2 mg/m2, Cycle 1, Reason: Provider Judgment) Administration: 2.75 mg (12/19/2018), 2.75 mg (01/09/2019), 2.75 mg (01/23/2019), 2.75 mg (02/06/2019), 2.75 mg (02/20/2019), 2.75 mg (03/06/2019), 2.75 mg (03/25/2019), 2.75 mg (04/03/2019), 2.75 mg (04/17/2019)  for chemotherapy treatment.      PHYSICAL EXAMINATION: ECOG PERFORMANCE STATUS: 2 - Symptomatic, <50% confined to bed  Vitals:   06/09/19 0512 06/09/19 0909  BP: 102/62 114/64  Pulse: 92 92  Resp: 20 18  Temp: 99 F (37.2 C) 98.3 F (36.8 C)  SpO2: 98% 98%   Filed Weights   06/06/19 2142 06/07/19 0500 06/08/19 0500  Weight: 198 lb 13.7 oz (90.2 kg) 198 lb 13.7 oz (90.2 kg) 198 lb 13.7 oz (90.2 kg)    Intake/Output from previous day: 12/14 0701 - 12/15 0700 In: 42 [P.O.:780] Out: 795 [Urine:795]  GENERAL:alert, no distress and comfortable LUNGS: clear to auscultation and percussion with normal breathing effort HEART: regular rate & rhythm and no murmurs and bilateral lower extremity edema, right greater than left ABDOMEN:abdomen soft, non-tender and normal bowel sounds NEURO: alert & oriented x 3 with fluent speech, no focal motor/sensory deficits  LABORATORY DATA:  I have reviewed the data as listed CMP Latest Ref Rng & Units 06/09/2019 06/06/2019 06/05/2019  Glucose 70 -  99 mg/dL 105(H) 101(H) 100(H)  BUN 8 - 23 mg/dL _0 Creatinine 0.44 - 1.00 mg/dL 1.09(H) 1.21(H) 1.17(H)  Sodium 135 - 145 mmol/L 135 131(L) 132(L)  Potassium 3.5 - 5.1 mmol/L 4.0 3.4(L) 4.0  Chloride 98 - 111 mmol/L 101 99 103  CO2 22 - 32 mmol/L _1 Calcium 8.9 - 10.3 mg/dL 8.6(L) 8.0(L) 8.1(L)  Total Protein 6.5 - 8.1 g/dL - - -  Total Bilirubin 0.3 - 1.2 mg/dL - - -  Alkaline Phos 38 - 126 U/L - - -  AST 15 - 41 U/L - - -  ALT 0 - 44 U/L - - -    Lab Results  Component Value Date   WBC 4.1 06/09/2019   HGB 7.9 (L) 06/09/2019   HCT 23.7 (L) 06/09/2019   MCV 94.0 06/09/2019   PLT 123 (L) 06/09/2019   NEUTROABS 1.8 06/01/2019    US RENAL  Result Date: 05/28/2019 CLINICAL DATA:  Acute kidney injury. EXAM: RENAL / URINARY TRACT ULTRASOUND COMPLETE COMPARISON:  PET CT scan 03/11/2019. FINDINGS: Right Kidney: Renal measurements: 9.9 x 4.3 x 5.0 cm = volume: 109.7 mL . Echogenicity within normal limits. No mass or hydronephrosis visualized. Left Kidney: Renal measurements: 9.6 x 5.4 x 4.9 cm = volume: Is  134.3. mL. Echogenicity within normal limits. No mass or hydronephrosis visualized. Bladder: Appears normal for degree of bladder distention. Other: Small right pleural effusion and gallstones are noted as seen on the prior exam. IMPRESSION: Negative for hydronephrosis.  Normal appearing kidneys. Right pleural effusion. Gallstones. Electronically Signed   By: Inge Rise M.D.   On: 05/28/2019 07:15   DG CHEST PORT 1 VIEW  Result Date: 06/08/2019 CLINICAL DATA:  Hypoxia EXAM: PORTABLE CHEST 1 VIEW COMPARISON:  07/10/2018 FINDINGS: Right Port-A-Cath in place with the tip in the SVC. Airspace disease in the right lower lung concerning for pneumonia. No confluent opacity on the left. Heart is borderline in size. Possible small right effusion. No acute bony abnormality. IMPRESSION: Right lower lung airspace disease concerning for pneumonia. Small right effusion suspected.  Electronically Signed   By: Rolm Baptise M.D.   On: 06/08/2019 18:55   VAS [place] LOWER EXTREMITY VENOUS (DVT)  Result Date: 06/09/2019  Lower Venous Study Indications: Swelling.  Risk Factors: Cancer History of metastatic breast cancer. Comparison Study: No prior study on file Performing Technologist: Sharion Dove RVS  Examination Guidelines: A complete evaluation includes B-mode imaging, spectral Doppler, color Doppler, and power Doppler as needed of all accessible portions of each vessel. Bilateral testing is considered an integral part of a complete examination. Limited examinations for reoccurring indications may be performed as noted.  +---------+---------------+---------+-----------+----------+--------------+ RIGHT    CompressibilityPhasicitySpontaneityPropertiesThrombus Aging +---------+---------------+---------+-----------+----------+--------------+ CFV      Partial        Yes      Yes                  Acute          +---------+---------------+---------+-----------+----------+--------------+ SFJ      Full                                                        +---------+---------------+---------+-----------+----------+--------------+ FV Prox  None                                         Acute          +---------+---------------+---------+-----------+----------+--------------+ FV Mid   None                                         Acute          +---------+---------------+---------+-----------+----------+--------------+ FV DistalNone                                         Acute          +---------+---------------+---------+-----------+----------+--------------+ PFV      Full                                                        +---------+---------------+---------+-----------+----------+--------------+ POP      None  No       No                   Acute          +---------+---------------+---------+-----------+----------+--------------+ PTV       None                                         Acute          +---------+---------------+---------+-----------+----------+--------------+ PERO     None                                         Acute          +---------+---------------+---------+-----------+----------+--------------+ EIV                     Yes      Yes                                 +---------+---------------+---------+-----------+----------+--------------+   +----+---------------+---------+-----------+----------+--------------+ LEFTCompressibilityPhasicitySpontaneityPropertiesThrombus Aging +----+---------------+---------+-----------+----------+--------------+ CFV Full           Yes      Yes                                 +----+---------------+---------+-----------+----------+--------------+     Summary: Right: Findings consistent with acute deep vein thrombosis involving the right common femoral vein, right femoral vein, right popliteal vein, right posterior tibial veins, and right peroneal veins. Left: No evidence of common femoral vein obstruction.  *See table(s) above for measurements and observations. Electronically signed by Harold Barban MD on 06/09/2019 at 8:27:06 AM.    Final     ASSESSMENT:63 y.o.[place] woman   (1) status post right lumpectomyand sentinel lymph node sampling October 2005 for a 0.6 cm invasive ductal carcinoma involving one out of 2 sentinel lymph nodes sampled, grade 3, triple-negative, treated adjuvantly with doxorubicin and cyclophosphamide4 followed by weekly paclitaxel7, followed by adjuvant radiation  RECURRENT DISEASE: (2) status post right breast upper outer quadrant biopsy and right axillary lymph node biopsy 10/01/2016, both positive for a T2 N1, stage IIIB invasive ductal carcinoma,triple negative, with an MIB-1 of 50-70%  (3) status post right modified radical mastectomy05/14/2018 showing a pT2 pN1, stage IIIB invasive ductal carcinoma, grade 3,  triple negative, with negative margins  (4) not a candidate for radiation given prior history  (5) adjuvant chemotherapy consisting of carboplatin and gemcitabine given days 1 and 8 of each 21 day cycle, for 6 cycles, starting 11/20/2016, completed 03/11/2017 (a) day 8 cycle 2 omitted because of neutropenia; Neupogen/Neulasta added  (5) HIV positivity: under care of Id Lucianne [place])  (6) Genetic testing 06/17/2017: no pathogenicmutations.Genes tested: APC, ATM, AXIN2, BARD1, BLM, BMPR1A, BRCA1, BRCA2, BRIP1, CDH1, CDK4, CDKN2A (p14ARF), CDKN2A (p16INK4a), CEBPA, CHEK2, CTNNA1, DICER1, EPCAM*, GATA2, GREM1*, HRAS, KIT, MEN1, MLH1, MSH2, MSH3, MSH6, MUTYH, NBN, NF1, PALB2, PDGFRA, PMS2, POLD1, POLE, PTEN, RAD50, RAD51C, RAD51D, RUNX1, SDHB, SDHC, SDHD, SMAD4, SMARCA4, STK11, TERC, TERT, TP53, TSC1, TSC2, VHL.The following genes were evaluated for sequence changes only: HOXB13*, NTHL1*, SDHA.  (a) A variant of uncertain significance (VUS)in a gene calledNTHL1was also noted.c.736G>A (p.Ala246Thr)  METASTATIC DISEASE: November 2019 (1)  Patient seen in urgent care and ultimately ED on 04/14/2018 for shortness of breath, chest xray demonstrated Right pleural effusion.  (a) right thoracenteses on 10/21 and 11/4 results show atypical cells, non diagnostic (b) CT chest 04/22/2018 shows re-accumulation of fluid, and right pleural nodularity. (c) PET scan on 05/01/2018 shows hypermetabolic pleural based metastases, right CP angle nodal metastases, no evidence of malignancy in abdomen and pelvis. (d) bronchoscopy with biopsy by Dr. Lamonte Sakai and BAL on 05/15/2018 was non diagnostic (e) VAT biopsy of the right pleura 12/06/2019confirms carcinoma, triple negative (f) [facility] shows PD-L1 positive(1% in [facility]); otherwise microsatellite stable, TMB low (5/Mb), no PIK3 mutations; other mutations suggest sensitivity to MTOR inhibitors and several  TKIs  (2) Atezolizumab started 07/15/2018 given every other week (a) PET 08/13/2018 shows tumor Right hemithorax (new baseline study) (b) Atezo changed to Q4w starting with 11/07/2018 dose (c) PET 11/28/2018 documents progression in the right lung and pleural area as well as lymph nodes, and a T1 skeletal metastasis (d) atezolizumab discontinued after 12/05/2018 dose  (3) eribulin day 1 and 8 of every 21 day cycle started 12/19/2018 (a) changed to every other week with Neulsta support due to neutropenia causing treatment delays and her h/o HIV (delays due to insurance denial of onpro after 02/20/2019 dose) (b) PET scan on 03/11/2019 showed stable to improved measurable disease, with multiple new bone lesions             (c) zoledronate given every 12 weeks starting on 03/25/2019  (4) brain MRI 04/28/2019 documents multiple intracranial metastases (a)whole brain radiation 04/30/2019 - 05/13/2019, 30 Gy in 10 fractions  (5) right lower extremity DVT diagnosed December 2020   PLAN:  [place] remains hospitalized and is awaiting SNF placement.  She has developed a right lower extremity DVT and currently on Lovenox.  The recommendation was to place her on Xarelto 10 mg daily (reduced dose due to thrombocytopenia) however, there is an interaction with her HIV medications.  I have spoken with the hospitalist who plans to discuss further with ID to see if any changes in her HIV medications can be made.  We can keep her on Lovenox for the time being.  She developed hypoxia yesterday and chest x-ray showed right lower lobe pneumonia.  She is currently on ceftriaxone and doxycycline.  Respiratory status seems to be improving.  Discussed with the hospitalist who plans to obtain a CT angiogram of the chest to evaluate for PE as well.  From oncology standpoint, we need to obtain a PET scan for restaging purposes on Ms.  Garlow once she is discharged.  We will work on rescheduling this once we know her discharge date.  She is currently scheduled for follow-up on 06/16/2019.  We will keep this appointment as scheduled.    LOS: 6 days   Mikey Bussing, DNP, AGPCNP-BC, AOCNP 06/09/19

## 2019-06-09 NOTE — Progress Notes (Signed)
Physical Therapy Treatment Patient Details Name: Kayla Price MRN: [id number] DOB: [birth year] Today's Date: 06/09/2019    History of Present Illness Kayla Price is a 63 y.o. female with medical history significant of metastatic breast cancer on chemotherapy, HIV disease, hyperlipidemia, chronic kidney disease stage II, hypertension, anemia of chronic disease, protein calorie malnutrition and s/p d/c home after 4 day admissinon for AKI and buttock wound.  She is admitted with continued weakness and with wound that could not be managed at home and urinary retention.    PT Comments    Pt was seen for mobility and attempted to walk but did not have energy or breath to do so.  O2 sats premobility were 94% and same at end of session, 96% at rest sitting upright.  Pt is weaker and could not stand on walker so did direct assist today.  Follow up as tolerated, and will continue on with attempts to stand and walk with support as needed.   Follow Up Recommendations  SNF     Equipment Recommendations  None recommended by PT    Recommendations for Other Services       Precautions / Restrictions Precautions Precautions: Fall Precaution Comments: watch HR Restrictions Weight Bearing Restrictions: No    Mobility  Bed Mobility Overal bed mobility: Needs Assistance Bed Mobility: Supine to Sit;Sit to Supine;Rolling Rolling: Max assist;Mod assist   Supine to sit: Mod assist Sit to supine: Mod assist   General bed mobility comments: max assist to scoot up bed  Transfers Overall transfer level: Needs assistance Equipment used: Rolling walker (2 wheeled) Transfers: Sit to/from Stand Sit to Stand: From elevated surface;Mod assist         General transfer comment: max assist for all mobility to stand  Ambulation/Gait             General Gait Details: unable to stand   Stairs             Wheelchair Mobility    Modified Rankin (Stroke Patients Only)        Balance Overall balance assessment: Needs assistance Sitting-balance support: Feet supported Sitting balance-Leahy Scale: Fair     Standing balance support: Bilateral upper extremity supported Standing balance-Leahy Scale: Poor                              Cognition Arousal/Alertness: Awake/alert Behavior During Therapy: WFL for tasks assessed/performed Overall Cognitive Status: Within Functional Limits for tasks assessed                                        Exercises      General Comments General comments (skin integrity, edema, etc.): pt is muchmore restricted today than last PT visit, reporting her difficuly of movement over increased weakness since 12/13      Pertinent Vitals/Pain Pain Assessment: Faces Faces Pain Scale: Hurts even more Pain Location: abd and peri Pain Descriptors / Indicators: Grimacing Pain Intervention(s): Limited activity within patient's tolerance;Monitored during session;Repositioned    Home Living                      Prior Function            PT Goals (current goals can now be found in the care plan section) Acute Rehab PT Goals Patient Stated Goal: to go  to rehab to get help with wound and to work to get stronger Progress towards PT goals: Not progressing toward goals - comment    Frequency    Min 2X/week      PT Plan Current plan remains appropriate    Co-evaluation              AM-PAC PT "6 Clicks" Mobility   Outcome Measure  Help needed turning from your back to your side while in a flat bed without using bedrails?: A Lot Help needed moving from lying on your back to sitting on the side of a flat bed without using bedrails?: A Lot Help needed moving to and from a bed to a chair (including a wheelchair)?: Total Help needed standing up from a chair using your arms (e.g., wheelchair or bedside chair)?: Total Help needed to walk in hospital room?: Total Help needed climbing 3-5  steps with a railing? : Total 6 Click Score: 8    End of Session Equipment Utilized During Treatment: Gait belt Activity Tolerance: Patient tolerated treatment well;Patient limited by fatigue Patient left: in bed;with call bell/phone within reach;with bed alarm set Nurse Communication: Mobility status(O2 sats) PT Visit Diagnosis: Other abnormalities of gait and mobility (R26.89);Muscle weakness (generalized) (M62.81);Pain     Time: 1115-1141 PT Time Calculation (min) (ACUTE ONLY): 26 min  Charges:  $Therapeutic Activity: 23-37 mins           Ramond Dial 06/09/2019, 5:32 PM    Mee Hives, PT MS Acute Rehab Dept. Number: [place] and [place]

## 2019-06-10 LAB — BASIC METABOLIC PANEL
Anion gap: 10 (ref 5–15)
BUN: 11 mg/dL (ref 8–23)
CO2: 25 mmol/L (ref 22–32)
Calcium: 8.5 mg/dL — ABNORMAL LOW (ref 8.9–10.3)
Chloride: 100 mmol/L (ref 98–111)
Creatinine, Ser: 1.03 mg/dL — ABNORMAL HIGH (ref 0.44–1.00)
GFR calc Af Amer: >60 mL/min (ref >60)
GFR calc non Af Amer: 58 mL/min — ABNORMAL LOW (ref >60)
Glucose, Bld: 110 mg/dL — ABNORMAL HIGH (ref 70–99)
Potassium: 3.7 mmol/L (ref 3.5–5.1)
Sodium: 135 mmol/L (ref 135–145)

## 2019-06-10 LAB — CBC
HCT: 26.1 % — ABNORMAL LOW (ref 36.0–46.0)
Hemoglobin: 8.5 g/dL — ABNORMAL LOW (ref 12.0–15.0)
MCH: 30.6 pg (ref 26.0–34.0)
MCHC: 32.6 g/dL (ref 30.0–36.0)
MCV: 93.9 fL (ref 80.0–100.0)
Platelets: 151 10*3/uL (ref 150–400)
RBC: 2.78 MIL/uL — ABNORMAL LOW (ref 3.87–5.11)
RDW: 19.4 % — ABNORMAL HIGH (ref 11.5–15.5)
WBC: 5.9 10*3/uL (ref 4.0–10.5)
nRBC: 1.5 % — ABNORMAL HIGH (ref 0.0–0.2)

## 2019-06-10 MED ORDER — SODIUM CHLORIDE 0.9 % IV SOLN: INTRAVENOUS | Status: DC | PRN

## 2019-06-10 NOTE — Progress Notes (Signed)
[facility]  PROGRESS NOTE  Kayla Price TRR:[id number] DOB: 1956-05-29 DOA: 06/01/2019 PCP: Lauree Chandler, NP Admit date - 06/01/2019   Admitting Physician Elwyn Reach, MD  Outpatient Primary MD for the patient is Lauree Chandler, NP  LOS - 7 Brief Narrative   Kayla Price is a 63 y.o. 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  In the ED findings notable for blood pressure 88/64, WBC 2.4, platelets 54, creatinine 1.75 (baseline 1.1), lactic acid within normal limits.  Patient was given IV fluids and admitted with working diagnosis of AKI, hypotension, and decubitus ulcerations of buttocks requiring aggressive wound care.  Hospital course complicated by superficial skin breakdown of groin/perineal area consistent with intertrigo/fungal infection with no signs of decubitus ulcers for which wound care saw and recommended appropriate dressings with nystatin powder/Desitin.  Given appearance of lesions for blue staining HSV PCR was sent, returned positive.  Patient was started on Valtrex on 12/11.  Patient started on empiric IV ceftriaxone doxycycline for presumed community-acquired pneumonia given chest x-ray findings, patient started on Lovenox for extensive right-sided DVT on 12/14.  CTA chest negative for pulmonary embolism.  Subjective  Patient interviewed and examined along with her female RN in room.  Overall feels much better.  Minimal discomfort of her open wounds in her groin and upper thighs but much improved compared to prior.  Denies dyspnea or chest pain.  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  - Lesions look less raw and patient reports much better in terms of pain. HSV-2 PCR of skin returned +  --Valtrex 1 g TID PO x 7 days, d/c fluconazole --f/u with ID as outpt arranged -Keeping this area clean/dry is key, currently has Foley for urinary retention, will eventually need a voiding trial -Overall improving as per patient's and nursing report.  Still has Foley catheter and may consider discontinuing soon.  Right lower extremity DVT.  Unilateral edema in setting of patient with known metastatic cancer.  Venous duplex positive for DVT -Initial plan to start Xarelto, but has interactions with HIV medications, so started on lovenox -In the interim we will continue Lovenox -Continue to closely monitor CBC given pancytopenia, no signs or symptoms of bleeding - CTA chest negative for PE.  Acute hypoxic respiratory failure secondary to CAP.  CXR demonstrated on 12/14 consistent with pneumonia.  Started on ceftriaxone and doxycycline (azithromycin interacts with other medications medications).  Remains afebrile, no leukocytosis, blood pressure stable. -Continue IV ceftriaxone, doxycycline -Obtain blood cultures, pending -Strep pneumo S productive -CTA chest 12/15: No evidence of pulmonary embolism.  Extensive mediastinal adenopathy, right pleural metastatic disease, right middle lobe mass are all stable since prior PET/CT.  Worsening groundglass airspace disease throughout both lungs concerning for pneumonia.  AKI on CKD, with urinary retention, back to baseline.  Creatinine improving with Foley in place.  Likely acute exacerbation in setting of brief hypotension and continue use of Benicar/also discontinued on recent hospitalization.  Renal ultrasound  on12/3 was negative for hydronephrosis -Avoid nephrotoxins  Hypotension, Stable . Decadron discontinued given random cortisol was 19, not consistent with adrenal  insufficiency.  Patient previously on Decadron for taper related to malignancy. -Closely monitor BP off steroids -Recommend discontinuation of Benicar on discharge -Hypotension resolved.  Hypothyroidism, stable -Continue Synthroid  Physical deconditioning.  Worked with PT on 12/8 and again on 12/10.  Prior level of independence: Independent with assistive devices.  Pain from groin site previously limited ambulation, but much better  in pain control and healing of skin site.  PT have noticed improvement in her strength and continue to believe patient would benefit from SNF level rehab upon D/C, social worker consulted to assist -encourage OOB to chair with assistance -PT recommends SNF, I agree SNF rehab would be beneficial  Given her prior level of independence and improvement in her pain that was previously limiting her --Prior hospitalist updated daughter on dispo on 12/13  HIV, controlled.  CD4 greater than 400 on 10/26 -Continue darunavir -cobicistat, dolutegravir -Discuss with ID if able to change medications to decrease interactions with potential anticoagulation  Recent E. coli UTI.  Diagnosed on previous hospital course. -Completed full course of cefdinir.  Pancytopenia, likely chemotherapy associated.  Stable.  No signs or symptoms of bleeding -Monitor CBC, SCDs  Metastatic breast cancer, metastases to the brain(04/2019) and right pleural space(03/2018)   Status post whole brain radiation (11/5-11/18/2020 -Followed by Dr. Jana Hakim, plan for outpatient PET scan to determine next step in treatment   Family Communication  : None at bedside. Code Status :  DNR  Disposition Plan  : Requiring IV antibiotics for recently diagnosed pneumonia.  PT recommends SNF given need for moderate-max assistance for bed mobility and transfers.  Previous baseline ambulatory with occasional use of cane, pain better controlled today.   Consults  :  Dr. Mariah Milling, ID  Procedures  :   none  DVT Prophylaxis  :   SCDs/Lovenox    Inpatient Medications Scheduled Meds: . Chlorhexidine Gluconate Cloth  6 each Topical Daily  . darunavir-cobicistat  1 tablet Oral QHS  . dolutegravir  50 mg Oral QHS   And  . rilpivirine  25 mg Oral QHS  . enoxaparin (LOVENOX) injection  90 mg Subcutaneous Q12H  . feeding supplement (ENSURE ENLIVE)  237 mL Oral TID BM  . levothyroxine  200 mcg Oral QAC breakfast  . maraviroc  150 mg Oral BID  . multivitamin with minerals  1 tablet Oral Daily  . valACYclovir  1,000 mg Oral BID   Continuous Infusions: . cefTRIAXone (ROCEPHIN)  IV 1 g (06/10/19 0513)  . doxycycline (VIBRAMYCIN) IV 100 mg (06/10/19 0742)   PRN Meds:.acetaminophen **OR** acetaminophen, calcium carbonate (dosed in mg elemental calcium), camphor-menthol **AND** hydrOXYzine, docusate sodium, HYDROcodone-acetaminophen, ondansetron **OR** ondansetron (ZOFRAN) IV, sodium chloride flush, sorbitol, zolpidem  Antibiotics  :   Anti-infectives (From admission, onward)   Start     Dose/Rate Route Frequency Ordered Stop   06/10/19 0600  cefTRIAXone (ROCEPHIN) 1 g in sodium chloride 0.9 % 100 mL IVPB     1 g 200 mL/hr over 30 Minutes Intravenous Every 24 hours 06/09/19 0732     06/09/19 0800  doxycycline (VIBRAMYCIN) 100 mg in sodium chloride 0.9 % 250 mL IVPB     100 mg 125 mL/hr over 120 Minutes Intravenous Every 12 hours 06/09/19 0735     06/09/19 0745  cefTRIAXone (ROCEPHIN) 1 g in sodium chloride 0.9 % 100 mL IVPB     1 g 200 mL/hr over 30 Minutes Intravenous  Once 06/09/19 0738 06/09/19 0924   06/05/19 2200  valACYclovir (VALTREX) tablet 1,000  mg     1,000 mg Oral 2 times daily 06/05/19 1418 06/12/19 2159   06/05/19 1000  valACYclovir (VALTREX) tablet 1,000 mg  Status:  Discontinued     1,000 mg Oral 3 times daily 06/05/19 0834 06/05/19 1418   06/02/19 2200  darunavir-cobicistat (PREZCOBIX) 800-150 MG per tablet 1 tablet     1 tablet Oral Daily at bedtime 06/02/19 0950      06/02/19 2200  dolutegravir (TIVICAY) tablet 50 mg     50 mg Oral Daily at bedtime 06/02/19 1455     06/02/19 2200  rilpivirine (EDURANT) tablet 25 mg     25 mg Oral Daily at bedtime 06/02/19 1455     06/02/19 1700  dolutegravir (TIVICAY) tablet 50 mg  Status:  Discontinued     50 mg Oral Daily with supper 06/02/19 1453 06/02/19 1456   06/02/19 1700  rilpivirine (EDURANT) tablet 25 mg  Status:  Discontinued     25 mg Oral Daily with supper 06/02/19 1453 06/02/19 1456   06/02/19 1415  fluconazole (DIFLUCAN) IVPB 200 mg  Status:  Discontinued     200 mg 100 mL/hr over 60 Minutes Intravenous Every 24 hours 06/02/19 1402 06/05/19 0834   06/02/19 1100  maraviroc (SELZENTRY) tablet 150 mg     150 mg Oral 2 times daily 06/02/19 0950     06/02/19 1000  cefdinir (OMNICEF) capsule 300 mg     300 mg Oral 2 times daily 06/02/19 0950 06/04/19 2207   06/02/19 1000  fluconazole (DIFLUCAN) tablet 400 mg  Status:  Discontinued     400 mg Oral Daily 06/02/19 0950 06/02/19 1402       Objective   Vitals:   06/09/19 1923 06/10/19 0448 06/10/19 0856 06/10/19 1500  BP: 124/83 109/70 113/75 110/68  Pulse: (!) 106 (!) 102 88 90  Resp: 20 20 18 18   Temp: 99.9 F (37.7 C) 98.4 F (36.9 C) 98.5 F (36.9 C) 97.8 F (36.6 C)  TempSrc: Oral Oral Oral Oral  SpO2: 95% 93% 97% 95%  Weight:      Height:        SpO2: 95 % O2 Flow Rate (L/min): 3 L/min  Wt Readings from Last 3 Encounters:  06/08/19 90.2 kg  05/31/19 90.7 kg  05/19/19 88 kg     Intake/Output Summary (Last 24 hours) at 06/10/2019 1908 Last data filed at 06/10/2019 1700 Gross per 24 hour  Intake 1396.75 ml  Output 1875 ml  Net -478.25 ml    Physical Exam: As stated above, patient was examined with her female RN chaperone in the room. General exam: Pleasant middle-aged female, moderately built and nourished lying comfortably propped up in bed without distress. RS: Slightly diminished breath sounds in the bases but otherwise clear  to auscultation.  No increased work of breathing. CVS: S1 and S2 heard, RRR.  No JVD, murmurs or pedal edema.  Telemetry personally reviewed: SR-mild ST in the 100s. Abdominal exam: Nondistended, soft and nontender.  No organomegaly or masses appreciated. Skin: Multiple ulcers in the groin/perivulval/upper thigh area which appear clean.  Reportedly improving compared to prior as per nursing and patient report. CNS: Alert and oriented.  No focal neurological deficits.     I have personally reviewed the following:   Data Reviewed:  CBC Recent Labs  Lab 06/05/19 0840 06/06/19 0744 06/08/19 2104 06/09/19 0423 06/10/19 0359  WBC 3.2* 3.2* 3.9* 4.1 5.9  HGB 8.1* 8.0* 7.9* 7.9* 8.5*  HCT 24.4* 23.9* 23.8*  23.7* 26.1*  PLT 79* 71* 120* 123* 151  MCV 93.8 93.7 93.7 94.0 93.9  MCH 31.2 31.4 31.1 31.3 30.6  MCHC 33.2 33.5 33.2 33.3 32.6  RDW 18.7* 18.9* 19.2* 19.3* 19.4*    Chemistries  Recent Labs  Lab 06/04/19 0830 06/05/19 0840 06/06/19 0744 06/09/19 0742 06/10/19 0359  NA 136 132* 131* 135 135  K 4.0 4.0 3.4* 4.0 3.7  CL 107 103 99 101 100  CO2 20* 22 24 26 25   GLUCOSE 110* 100* 101* 105* 110*  BUN 21 16 13 10 11   CREATININE 1.28* 1.17* 1.21* 1.09* 1.03*  CALCIUM 8.0* 8.1* 8.0* 8.6* 8.5*   ------------------------------------------------------------------------------------------------------------------ No results for input(s): CHOL, HDL, LDLCALC, TRIG, CHOLHDL, LDLDIRECT in the last 72 hours.  Lab Results  Component Value Date   HGBA1C 6.1 03/07/2019   - Micro Results Recent Results (from the past 240 hour(s))  SARS CORONAVIRUS 2 (TAT 6-24 HRS) Nasopharyngeal Nasopharyngeal Swab     Status: None   Collection Time: 06/01/19 10:46 PM   Specimen: Nasopharyngeal Swab  Result Value Ref Range Status   SARS Coronavirus 2 NEGATIVE NEGATIVE Final    Comment: (NOTE) SARS-CoV-2 target nucleic acids are NOT DETECTED. The SARS-CoV-2 RNA is generally detectable in upper  and lower respiratory specimens during the acute phase of infection. Negative results do not preclude SARS-CoV-2 infection, do not rule out co-infections with other pathogens, and should not be used as the sole basis for treatment or other patient management decisions. Negative results must be combined with clinical observations, patient history, and epidemiological information. The expected result is Negative. Fact Sheet for Patients: SugarRoll.be Fact Sheet for Healthcare Providers: https://www.woods-mathews.com/ This test is not yet approved or cleared by the [place] FDA and  has been authorized for detection and/or diagnosis of SARS-CoV-2 by FDA under an Emergency Use Authorization (EUA). This EUA will remain  in effect (meaning this test can be used) for the duration of the COVID-19 declaration under Section 56 4(b)(1) of the Act, 21 U.S.C. section 360bbb-3(b)(1), unless the authorization is terminated or revoked sooner. Performed at [facility], [place] [address], [place], [place] [address]   Hsv Culture And Typing     Status: Abnormal   Collection Time: 06/02/19  5:48 PM   Specimen: Skin, Other  Result Value Ref Range Status   HSV Culture/Type Comment (A)  Final    Comment: (NOTE) Positive for Herpes simplex virus type-2. Typing was confirmed by monoclonal antibody microscopic immunofluorescence. Performed At: [facility] [place], [place] [id number] Rush Farmer MD TK:[id number]    Source of Sample SKIN  Final    Comment: Performed at [facility], [place] [address] [place], [place] [address]  Culture, blood (Routine X 2) w Reflex to ID Panel     Status: None (Preliminary result)   Collection Time: 06/09/19  7:50 AM   Specimen: BLOOD  Result Value Ref Range Status   Specimen Description BLOOD LEFT ANTECUBITAL  Final   Special Requests   Final    BOTTLES DRAWN AEROBIC ONLY Blood Culture  results may not be optimal due to an inadequate volume of blood received in culture bottles   Culture   Final    NO GROWTH 1 DAY Performed at [facility], [place] [address] [place], [place] [address]    Report Status PENDING  Incomplete  Culture, blood (Routine X 2) w Reflex to ID Panel     Status: None (  Preliminary result)   Collection Time: 06/09/19  7:55 AM   Specimen: BLOOD LEFT HAND  Result Value Ref Range Status   Specimen Description BLOOD LEFT HAND  Final   Special Requests   Final    BOTTLES DRAWN AEROBIC ONLY Blood Culture results may not be optimal due to an inadequate volume of blood received in culture bottles   Culture   Final    NO GROWTH 1 DAY Performed at [facility], [place] [address], [place], [place] [address]    Report Status PENDING  Incomplete    Radiology Reports CT ANGIO CHEST PE W OR WO CONTRAST  Result Date: 06/09/2019 CLINICAL DATA:  Shortness of breath.  Metastatic breast cancer. EXAM: CT ANGIOGRAPHY CHEST WITH CONTRAST TECHNIQUE: Multidetector CT imaging of the chest was performed using the standard protocol during bolus administration of intravenous contrast. Multiplanar CT image reconstructions and MIPs were obtained to evaluate the vascular anatomy. CONTRAST:  28mL OMNIPAQUE IOHEXOL 350 MG/ML SOLN COMPARISON:  PET CT 03/11/2019.  Chest CT 04/22/2018 FINDINGS: Cardiovascular: No filling defects in the pulmonary arteries to suggest pulmonary emboli. Tortuous, ectatic aorta with maximum diameter 3.7 cm. No dissection. Heart is enlarged. Mediastinum/Nodes: Extensive adenopathy in the mediastinum. Surgical clips in the right axilla. This is unchanged since recent PET CT. Lungs/Pleura: Extensive pleural nodularity/disease throughout right hemithorax, similar to recent PET CT. Right middle lobe mass is unchanged. Central necrotic portion again noted, unchanged. Scattered ground-glass airspace disease noted within both lungs concerning for pneumonia. Upper  Abdomen: Gastrohepatic ligament lymph nodes again noted, unchanged. No acute findings. Musculoskeletal: Prior right mastectomy. Right chest wall Port-A-Cath remains in place, unchanged. Sclerotic lesion within the T11 vertebral body is stable. Review of the MIP images confirms the above findings. IMPRESSION: No evidence of pulmonary embolus. Extensive mediastinal adenopathy, right pleural metastatic disease, and right middle lobe mass are all stable since prior PET CT. Worsening ground-glass airspace disease throughout both lungs concerning for pneumonia. Cardiomegaly. Tortuous, ectatic thoracic aorta. Electronically Signed   By: Rolm Baptise M.D.   On: 06/09/2019 17:44   US RENAL  Result Date: 05/28/2019 CLINICAL DATA:  Acute kidney injury. EXAM: RENAL / URINARY TRACT ULTRASOUND COMPLETE COMPARISON:  PET CT scan 03/11/2019. FINDINGS: Right Kidney: Renal measurements: 9.9 x 4.3 x 5.0 cm = volume: 109.7 mL . Echogenicity within normal limits. No mass or hydronephrosis visualized. Left Kidney: Renal measurements: 9.6 x 5.4 x 4.9 cm = volume: Is 134.3. mL. Echogenicity within normal limits. No mass or hydronephrosis visualized. Bladder: Appears normal for degree of bladder distention. Other: Small right pleural effusion and gallstones are noted as seen on the prior exam. IMPRESSION: Negative for hydronephrosis.  Normal appearing kidneys. Right pleural effusion. Gallstones. Electronically Signed   By: Inge Rise M.D.   On: 05/28/2019 07:15   DG CHEST PORT 1 VIEW  Result Date: 06/08/2019 CLINICAL DATA:  Hypoxia EXAM: PORTABLE CHEST 1 VIEW COMPARISON:  07/10/2018 FINDINGS: Right Port-A-Cath in place with the tip in the SVC. Airspace disease in the right lower lung concerning for pneumonia. No confluent opacity on the left. Heart is borderline in size. Possible small right effusion. No acute bony abnormality. IMPRESSION: Right lower lung airspace disease concerning for pneumonia. Small right effusion  suspected. Electronically Signed   By: Rolm Baptise M.D.   On: 06/08/2019 18:55   VAS [place] LOWER EXTREMITY VENOUS (DVT)  Result Date: 06/09/2019  Lower Venous Study Indications: Swelling.  Risk Factors: Cancer History of metastatic breast cancer. Comparison  Study: No prior study on file Performing Technologist: Sharion Dove RVS  Examination Guidelines: A complete evaluation includes B-mode imaging, spectral Doppler, color Doppler, and power Doppler as needed of all accessible portions of each vessel. Bilateral testing is considered an integral part of a complete examination. Limited examinations for reoccurring indications may be performed as noted.  +---------+---------------+---------+-----------+----------+--------------+ RIGHT    CompressibilityPhasicitySpontaneityPropertiesThrombus Aging +---------+---------------+---------+-----------+----------+--------------+ CFV      Partial        Yes      Yes                  Acute          +---------+---------------+---------+-----------+----------+--------------+ SFJ      Full                                                        +---------+---------------+---------+-----------+----------+--------------+ FV Prox  None                                         Acute          +---------+---------------+---------+-----------+----------+--------------+ FV Mid   None                                         Acute          +---------+---------------+---------+-----------+----------+--------------+ FV DistalNone                                         Acute          +---------+---------------+---------+-----------+----------+--------------+ PFV      Full                                                        +---------+---------------+---------+-----------+----------+--------------+ POP      None           No       No                   Acute           +---------+---------------+---------+-----------+----------+--------------+ PTV      None                                         Acute          +---------+---------------+---------+-----------+----------+--------------+ PERO     None                                         Acute          +---------+---------------+---------+-----------+----------+--------------+ EIV                     Yes  Yes                                 +---------+---------------+---------+-----------+----------+--------------+   +----+---------------+---------+-----------+----------+--------------+ LEFTCompressibilityPhasicitySpontaneityPropertiesThrombus Aging +----+---------------+---------+-----------+----------+--------------+ CFV Full           Yes      Yes                                 +----+---------------+---------+-----------+----------+--------------+     Summary: Right: Findings consistent with acute deep vein thrombosis involving the right common femoral vein, right femoral vein, right popliteal vein, right posterior tibial veins, and right peroneal veins. Left: No evidence of common femoral vein obstruction.  *See table(s) above for measurements and observations. Electronically signed by Harold Barban MD on 06/09/2019 at 8:27:06 AM.    Final     Vernell Leep, MD, [place], [facility]. [facility]  To contact the attending provider between 7A-7P or the covering provider during after hours 7P-7A, please log into the web site [email or web address] and access using universal [place] password for that web site. If you do not have the password, please call the hospital operator.

## 2019-06-10 NOTE — Consult Note (Signed)
[facility] Inpatient Consult   06/10/2019  Kayla Price [birth year] [id number]  Follow up: [facility] H[facility]  Chart reviewed for progress and disposition.  Patient is still being recommended for a skilled nursing facility transition for disposition.  Plan: Continue to follow progress and disposition.  Charlesetta Shanks, RN BSN CCM [facility]  [phone] business mobile phone Toll free office [phone]  Fax number: [phone] [name]

## 2019-06-10 NOTE — Plan of Care (Signed)
  Problem: Clinical Measurements: Goal: Respiratory complications will improve Outcome: Not Progressing   

## 2019-06-11 LAB — CBC
HCT: 25.5 % — ABNORMAL LOW (ref 36.0–46.0)
Hemoglobin: 8.1 g/dL — ABNORMAL LOW (ref 12.0–15.0)
MCH: 31.2 pg (ref 26.0–34.0)
MCHC: 31.8 g/dL (ref 30.0–36.0)
MCV: 98.1 fL (ref 80.0–100.0)
Platelets: 182 10*3/uL (ref 150–400)
RBC: 2.6 MIL/uL — ABNORMAL LOW (ref 3.87–5.11)
RDW: 19.6 % — ABNORMAL HIGH (ref 11.5–15.5)
WBC: 5.9 10*3/uL (ref 4.0–10.5)
nRBC: 1.7 % — ABNORMAL HIGH (ref 0.0–0.2)

## 2019-06-11 MED ORDER — FUROSEMIDE 10 MG/ML IJ SOLN
20.0000 mg | Freq: Once | INTRAMUSCULAR | Status: AC
  Administered 2019-06-11: 20 mg via INTRAVENOUS
  Filled 2019-06-11: qty 2

## 2019-06-11 NOTE — Progress Notes (Addendum)
[facility]  PROGRESS NOTE  Kayla Price UUV:[id number] DOB: 1955-08-19 DOA: 06/01/2019 PCP: Lauree Chandler, NP Admit date - 06/01/2019   Admitting Physician Elwyn Reach, MD  Outpatient Primary MD for the patient is Lauree Chandler, NP  LOS - 8 Brief Narrative   Kayla Price is a 63 y.o. 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  In the ED findings notable for blood pressure 88/64, WBC 2.4, platelets 54, creatinine 1.75 (baseline 1.1), lactic acid within normal limits.  Patient was given IV fluids and admitted with working diagnosis of AKI, hypotension, and decubitus ulcerations of buttocks requiring aggressive wound care.  Hospital course complicated by superficial skin breakdown of groin/perineal area consistent with intertrigo/fungal infection with no signs of decubitus ulcers for which wound care saw and recommended appropriate dressings with nystatin powder/Desitin.  Given appearance of lesions for blue staining HSV PCR was sent, returned positive.  Patient was started on Valtrex on 12/11.  Patient started on empiric IV ceftriaxone doxycycline for presumed community-acquired pneumonia given chest x-ray findings, patient started on Lovenox for extensive right-sided DVT on 12/14.  CTA chest negative for pulmonary embolism.  Subjective  Patient interviewed and examined with patient's RN in room.  Although appears mildly tachypneic at times while talking or with minimal exertion, patient denies dyspnea.  Keeps saying that she feels somewhat short of breath when she is unable to sleep at night  and is helped by hydrocodone.  No pain reported.  Had normal BM in the last 24 hours.  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  - Lesions look less raw and patient reports much better in terms of pain. HSV-2 PCR of skin returned +  --Valtrex 1 g TID PO x 7 days, d/c fluconazole --f/u with ID as outpt arranged -Keeping this area clean/dry is key, -Discontinued Foley catheter on 12/17 and attempt voiding trial.  Discussed with patient and RN.  Patient agrees. -Improving.  Right lower extremity DVT.  Unilateral edema in setting of patient with known metastatic cancer.  Venous duplex positive for DVT -Initial plan to start Xarelto, but has interactions with HIV medications, so started on lovenox -In the interim we will continue Lovenox -Continue to closely monitor CBC given pancytopenia, no signs or symptoms of bleeding - CTA chest negative for PE.  Acute hypoxic respiratory failure secondary to CAP.  CXR demonstrated on 12/14 consistent with pneumonia.  Started on ceftriaxone and doxycycline (azithromycin interacts with other medications medications).  Remains afebrile, no leukocytosis, blood pressure stable. -Continue IV ceftriaxone, doxycycline -Blood cultures x2 from 12/15: Negative to date. -CTA chest 12/15: No evidence of pulmonary embolism.  Extensive mediastinal adenopathy, right pleural metastatic disease, right middle lobe mass are all stable since prior PET/CT.  Worsening groundglass airspace disease throughout both lungs concerning for pneumonia. -Patient appears dyspneic on minimal exertion, ongoing hypoxia.  It could be possibly from pneumonia complicating already compromised pulmonary status from above CT chest findings related to her malignancy. -She may have an element of pulmonary edema pertaining to recent IV fluid resuscitation.  Will try Lasix 20 mg IV x1 dose to  see if it helps her respiratory status.  AKI on CKD, with  urinary retention, back to baseline.  Creatinine improving with Foley in place.  Likely acute exacerbation in setting of brief hypotension and continue use of Benicar/also discontinued on recent hospitalization.  Renal ultrasound  on12/3 was negative for hydronephrosis -Avoid nephrotoxins  Hypotension, Stable . Decadron discontinued given random cortisol was 19, not consistent with adrenal insufficiency.  Patient previously on Decadron for taper related to malignancy. -Closely monitor BP off steroids -Recommend discontinuation of Benicar on discharge -Hypotension resolved.  Hypothyroidism, stable -Continue Synthroid  Physical deconditioning.  Prior level of independence: Independent with assistive devices.  Pain from groin site previously limited ambulation, but much better  in pain control and healing of skin site.  PT have noticed improvement in her strength and continue to believe patient would benefit from SNF level rehab upon D/C, social worker consulted to assist -encourage OOB to chair with assistance -PT recommends SNF, I agree SNF rehab would be beneficial  Given her prior level of independence and improvement in her pain that was previously limiting her --PT evaluated 12/17 and continue to recommend SNF.  HIV, controlled.  CD4 greater than 400 on 10/26 -Continue darunavir -cobicistat, dolutegravir -Discuss with ID if able to change medications to decrease interactions with potential anticoagulation  Recent E. coli UTI.  Diagnosed on previous hospital course. -Completed full course of cefdinir.  Pancytopenia, likely chemotherapy associated.  Stable.  No signs or symptoms of bleeding -Monitor CBC, SCDs  Metastatic breast cancer, metastases to the brain(04/2019) and right pleural space(03/2018)   Status post whole brain radiation (11/5-11/18/2020 -Followed by Dr. Jana Hakim, plan for outpatient PET scan to determine next step in treatment   Family Communication  : None at bedside.  I discussed in detail with patient's daughter, updated care and answered questions.  Code Status :  DNR  Disposition Plan  : Requiring IV antibiotics for recently diagnosed pneumonia.  PT recommends SNF given need for moderate-max assistance for bed mobility and transfers.  Previous baseline ambulatory with occasional use of cane, pain better controlled today.   Consults  :  Dr. Mariah Milling, ID  Procedures  :  none  DVT Prophylaxis  :   SCDs/Lovenox    Inpatient Medications Scheduled Meds: . Chlorhexidine Gluconate Cloth  6 each Topical Daily  . darunavir-cobicistat  1 tablet Oral QHS  . dolutegravir  50 mg Oral QHS   And  . rilpivirine  25 mg Oral QHS  . enoxaparin (LOVENOX) injection  90 mg Subcutaneous Q12H  . feeding supplement (ENSURE ENLIVE)  237 mL Oral TID BM  . levothyroxine  200 mcg Oral QAC breakfast  . maraviroc  150 mg Oral BID  . multivitamin with minerals  1 tablet Oral Daily  . valACYclovir  1,000 mg Oral BID   Continuous Infusions: . sodium chloride 10 mL/hr at 06/10/19 2141  . cefTRIAXone (ROCEPHIN)  IV 1 g (06/11/19 9767)  . doxycycline (VIBRAMYCIN) IV 100 mg (06/11/19 0752)   PRN Meds:.sodium chloride, acetaminophen **OR** acetaminophen, calcium carbonate (dosed in mg elemental calcium), camphor-menthol **AND** hydrOXYzine, docusate sodium, HYDROcodone-acetaminophen, ondansetron **OR** ondansetron (ZOFRAN) IV, sodium chloride flush, sorbitol, zolpidem  Antibiotics  :   Anti-infectives (From admission, onward)   Start     Dose/Rate Route Frequency Ordered Stop   06/10/19 0600  cefTRIAXone (ROCEPHIN) 1 g in sodium chloride 0.9 % 100 mL IVPB     1 g 200 mL/hr over 30 Minutes Intravenous Every 24 hours 06/09/19  0732     06/09/19 0800  doxycycline (VIBRAMYCIN) 100 mg in sodium chloride 0.9 % 250 mL IVPB     100 mg 125 mL/hr over 120 Minutes Intravenous Every 12 hours 06/09/19 0735     06/09/19 0745  cefTRIAXone (ROCEPHIN) 1 g in sodium chloride 0.9 %  100 mL IVPB     1 g 200 mL/hr over 30 Minutes Intravenous  Once 06/09/19 0738 06/09/19 0924   06/05/19 2200  valACYclovir (VALTREX) tablet 1,000 mg     1,000 mg Oral 2 times daily 06/05/19 1418 06/12/19 2159   06/05/19 1000  valACYclovir (VALTREX) tablet 1,000 mg  Status:  Discontinued     1,000 mg Oral 3 times daily 06/05/19 0834 06/05/19 1418   06/02/19 2200  darunavir-cobicistat (PREZCOBIX) 800-150 MG per tablet 1 tablet     1 tablet Oral Daily at bedtime 06/02/19 0950     06/02/19 2200  dolutegravir (TIVICAY) tablet 50 mg     50 mg Oral Daily at bedtime 06/02/19 1455     06/02/19 2200  rilpivirine (EDURANT) tablet 25 mg     25 mg Oral Daily at bedtime 06/02/19 1455     06/02/19 1700  dolutegravir (TIVICAY) tablet 50 mg  Status:  Discontinued     50 mg Oral Daily with supper 06/02/19 1453 06/02/19 1456   06/02/19 1700  rilpivirine (EDURANT) tablet 25 mg  Status:  Discontinued     25 mg Oral Daily with supper 06/02/19 1453 06/02/19 1456   06/02/19 1415  fluconazole (DIFLUCAN) IVPB 200 mg  Status:  Discontinued     200 mg 100 mL/hr over 60 Minutes Intravenous Every 24 hours 06/02/19 1402 06/05/19 0834   06/02/19 1100  maraviroc (SELZENTRY) tablet 150 mg     150 mg Oral 2 times daily 06/02/19 0950     06/02/19 1000  cefdinir (OMNICEF) capsule 300 mg     300 mg Oral 2 times daily 06/02/19 0950 06/04/19 2207   06/02/19 1000  fluconazole (DIFLUCAN) tablet 400 mg  Status:  Discontinued     400 mg Oral Daily 06/02/19 0950 06/02/19 1402       Objective   Vitals:   06/10/19 1500 06/10/19 2026 06/11/19 0604 06/11/19 0805  BP: 110/68 126/82 113/71 111/73  Pulse: 90 (!) 101 98 100  Resp: 18 20 19 20   Temp: 97.8 F (36.6 C) 98.4 F (36.9 C) 98 F (36.7 C) 98.2 F (36.8 C)  TempSrc: Oral   Oral  SpO2: 95% 97% 95% 95%  Weight:  90.2 kg    Height:        SpO2: 95 % O2 Flow Rate (L/min): 3 L/min  Wt Readings from Last 3 Encounters:  06/10/19 90.2 kg  05/31/19 90.7 kg  05/19/19  88 kg     Intake/Output Summary (Last 24 hours) at 06/11/2019 1721 Last data filed at 06/11/2019 0857 Gross per 24 hour  Intake 534.98 ml  Output 625 ml  Net -90.02 ml    Physical Exam:  General exam: Pleasant middle-aged female, moderately built and nourished lying propped up in bed with intermittent mild tachypnea. RS: Slightly harsh breath sounds in the bases with occasional basal crackles but otherwise clear to auscultation without wheezing or rhonchi.  Mild tachypnea noted. CVS: S1 and S2 heard, RRR.  No JVD, murmurs or pedal edema.  Telemetry personally reviewed: Sinus rhythm. Abdominal exam: Nondistended, soft and nontender.  No organomegaly or masses appreciated. Skin: As examined on 12/16: Multiple ulcers  in the groin/perivulval/upper thigh area which appear clean.  Reportedly improving compared to prior as per nursing and patient report. CNS: Alert and oriented.  No focal neurological deficits.     I have personally reviewed the following:   Data Reviewed:  CBC Recent Labs  Lab 06/06/19 0744 06/08/19 2104 06/09/19 0423 06/10/19 0359 06/11/19 0422  WBC 3.2* 3.9* 4.1 5.9 5.9  HGB 8.0* 7.9* 7.9* 8.5* 8.1*  HCT 23.9* 23.8* 23.7* 26.1* 25.5*  PLT 71* 120* 123* 151 182  MCV 93.7 93.7 94.0 93.9 98.1  MCH 31.4 31.1 31.3 30.6 31.2  MCHC 33.5 33.2 33.3 32.6 31.8  RDW 18.9* 19.2* 19.3* 19.4* 19.6*    Chemistries  Recent Labs  Lab 06/05/19 0840 06/06/19 0744 06/09/19 0742 06/10/19 0359  NA 132* 131* 135 135  K 4.0 3.4* 4.0 3.7  CL 103 99 101 100  CO2 22 24 26 25   GLUCOSE 100* 101* 105* 110*  BUN 16 13 10 11   CREATININE 1.17* 1.21* 1.09* 1.03*  CALCIUM 8.1* 8.0* 8.6* 8.5*   ------------------------------------------------------------------------------------------------------------------ No results for input(s): CHOL, HDL, LDLCALC, TRIG, CHOLHDL, LDLDIRECT in the last 72 hours.  Lab Results  Component Value Date   HGBA1C 6.1 03/07/2019   - Micro  Results Recent Results (from the past 240 hour(s))  SARS CORONAVIRUS 2 (TAT 6-24 HRS) Nasopharyngeal Nasopharyngeal Swab     Status: None   Collection Time: 06/01/19 10:46 PM   Specimen: Nasopharyngeal Swab  Result Value Ref Range Status   SARS Coronavirus 2 NEGATIVE NEGATIVE Final    Comment: (NOTE) SARS-CoV-2 target nucleic acids are NOT DETECTED. The SARS-CoV-2 RNA is generally detectable in upper and lower respiratory specimens during the acute phase of infection. Negative results do not preclude SARS-CoV-2 infection, do not rule out co-infections with other pathogens, and should not be used as the sole basis for treatment or other patient management decisions. Negative results must be combined with clinical observations, patient history, and epidemiological information. The expected result is Negative. Fact Sheet for Patients: SugarRoll.be Fact Sheet for Healthcare Providers: https://www.woods-mathews.com/ This test is not yet approved or cleared by the [place] FDA and  has been authorized for detection and/or diagnosis of SARS-CoV-2 by FDA under an Emergency Use Authorization (EUA). This EUA will remain  in effect (meaning this test can be used) for the duration of the COVID-19 declaration under Section 56 4(b)(1) of the Act, 21 U.S.C. section 360bbb-3(b)(1), unless the authorization is terminated or revoked sooner. Performed at [facility], [place] [address] [place], [place] [address]   Hsv Culture And Typing     Status: Abnormal   Collection Time: 06/02/19  5:48 PM   Specimen: Skin, Other  Result Value Ref Range Status   HSV Culture/Type Comment (A)  Final    Comment: (NOTE) Positive for Herpes simplex virus type-2. Typing was confirmed by monoclonal antibody microscopic immunofluorescence. Performed At: [facility] [place], [place] [id number] Rush Farmer MD EX:[id number]    Source of  Sample SKIN  Final    Comment: Performed at [facility], [place] [address], [place], [place] [address]  Culture, blood (Routine X 2) w Reflex to ID Panel     Status: None (Preliminary result)   Collection Time: 06/09/19  7:50 AM   Specimen: BLOOD  Result Value Ref Range Status   Specimen Description BLOOD LEFT ANTECUBITAL  Final   Special Requests   Final    BOTTLES DRAWN AEROBIC ONLY Blood  Culture results may not be optimal due to an inadequate volume of blood received in culture bottles   Culture   Final    NO GROWTH 2 DAYS Performed at [facility], [place] [address] [place], [place] [address]    Report Status PENDING  Incomplete  Culture, blood (Routine X 2) w Reflex to ID Panel     Status: None (Preliminary result)   Collection Time: 06/09/19  7:55 AM   Specimen: BLOOD LEFT HAND  Result Value Ref Range Status   Specimen Description BLOOD LEFT HAND  Final   Special Requests   Final    BOTTLES DRAWN AEROBIC ONLY Blood Culture results may not be optimal due to an inadequate volume of blood received in culture bottles   Culture   Final    NO GROWTH 2 DAYS Performed at [facility], [place] [address], [place], [place] [address]    Report Status PENDING  Incomplete    Radiology Reports CT ANGIO CHEST PE W OR WO CONTRAST  Result Date: 06/09/2019 CLINICAL DATA:  Shortness of breath.  Metastatic breast cancer. EXAM: CT ANGIOGRAPHY CHEST WITH CONTRAST TECHNIQUE: Multidetector CT imaging of the chest was performed using the standard protocol during bolus administration of intravenous contrast. Multiplanar CT image reconstructions and MIPs were obtained to evaluate the vascular anatomy. CONTRAST:  49mL OMNIPAQUE IOHEXOL 350 MG/ML SOLN COMPARISON:  PET CT 03/11/2019.  Chest CT 04/22/2018 FINDINGS: Cardiovascular: No filling defects in the pulmonary arteries to suggest pulmonary emboli. Tortuous, ectatic aorta with maximum diameter 3.7 cm. No dissection. Heart is enlarged.  Mediastinum/Nodes: Extensive adenopathy in the mediastinum. Surgical clips in the right axilla. This is unchanged since recent PET CT. Lungs/Pleura: Extensive pleural nodularity/disease throughout right hemithorax, similar to recent PET CT. Right middle lobe mass is unchanged. Central necrotic portion again noted, unchanged. Scattered ground-glass airspace disease noted within both lungs concerning for pneumonia. Upper Abdomen: Gastrohepatic ligament lymph nodes again noted, unchanged. No acute findings. Musculoskeletal: Prior right mastectomy. Right chest wall Port-A-Cath remains in place, unchanged. Sclerotic lesion within the T11 vertebral body is stable. Review of the MIP images confirms the above findings. IMPRESSION: No evidence of pulmonary embolus. Extensive mediastinal adenopathy, right pleural metastatic disease, and right middle lobe mass are all stable since prior PET CT. Worsening ground-glass airspace disease throughout both lungs concerning for pneumonia. Cardiomegaly. Tortuous, ectatic thoracic aorta. Electronically Signed   By: Rolm Baptise M.D.   On: 06/09/2019 17:44   US RENAL  Result Date: 05/28/2019 CLINICAL DATA:  Acute kidney injury. EXAM: RENAL / URINARY TRACT ULTRASOUND COMPLETE COMPARISON:  PET CT scan 03/11/2019. FINDINGS: Right Kidney: Renal measurements: 9.9 x 4.3 x 5.0 cm = volume: 109.7 mL . Echogenicity within normal limits. No mass or hydronephrosis visualized. Left Kidney: Renal measurements: 9.6 x 5.4 x 4.9 cm = volume: Is 134.3. mL. Echogenicity within normal limits. No mass or hydronephrosis visualized. Bladder: Appears normal for degree of bladder distention. Other: Small right pleural effusion and gallstones are noted as seen on the prior exam. IMPRESSION: Negative for hydronephrosis.  Normal appearing kidneys. Right pleural effusion. Gallstones. Electronically Signed   By: Inge Rise M.D.   On: 05/28/2019 07:15   DG CHEST PORT 1 VIEW  Result Date:  06/08/2019 CLINICAL DATA:  Hypoxia EXAM: PORTABLE CHEST 1 VIEW COMPARISON:  07/10/2018 FINDINGS: Right Port-A-Cath in place with the tip in the SVC. Airspace disease in the right lower lung concerning for pneumonia. No confluent opacity on the left.  Heart is borderline in size. Possible small right effusion. No acute bony abnormality. IMPRESSION: Right lower lung airspace disease concerning for pneumonia. Small right effusion suspected. Electronically Signed   By: Rolm Baptise M.D.   On: 06/08/2019 18:55   VAS [place] LOWER EXTREMITY VENOUS (DVT)  Result Date: 06/09/2019  Lower Venous Study Indications: Swelling.  Risk Factors: Cancer History of metastatic breast cancer. Comparison Study: No prior study on file Performing Technologist: Sharion Dove RVS  Examination Guidelines: A complete evaluation includes B-mode imaging, spectral Doppler, color Doppler, and power Doppler as needed of all accessible portions of each vessel. Bilateral testing is considered an integral part of a complete examination. Limited examinations for reoccurring indications may be performed as noted.  +---------+---------------+---------+-----------+----------+--------------+ RIGHT    CompressibilityPhasicitySpontaneityPropertiesThrombus Aging +---------+---------------+---------+-----------+----------+--------------+ CFV      Partial        Yes      Yes                  Acute          +---------+---------------+---------+-----------+----------+--------------+ SFJ      Full                                                        +---------+---------------+---------+-----------+----------+--------------+ FV Prox  None                                         Acute          +---------+---------------+---------+-----------+----------+--------------+ FV Mid   None                                         Acute          +---------+---------------+---------+-----------+----------+--------------+ FV DistalNone                                          Acute          +---------+---------------+---------+-----------+----------+--------------+ PFV      Full                                                        +---------+---------------+---------+-----------+----------+--------------+ POP      None           No       No                   Acute          +---------+---------------+---------+-----------+----------+--------------+ PTV      None                                         Acute          +---------+---------------+---------+-----------+----------+--------------+ PERO     None  Acute          +---------+---------------+---------+-----------+----------+--------------+ EIV                     Yes      Yes                                 +---------+---------------+---------+-----------+----------+--------------+   +----+---------------+---------+-----------+----------+--------------+ LEFTCompressibilityPhasicitySpontaneityPropertiesThrombus Aging +----+---------------+---------+-----------+----------+--------------+ CFV Full           Yes      Yes                                 +----+---------------+---------+-----------+----------+--------------+     Summary: Right: Findings consistent with acute deep vein thrombosis involving the right common femoral vein, right femoral vein, right popliteal vein, right posterior tibial veins, and right peroneal veins. Left: No evidence of common femoral vein obstruction.  *See table(s) above for measurements and observations. Electronically signed by Harold Barban MD on 06/09/2019 at 8:27:06 AM.    Final     Vernell Leep, MD, [place], [facility]. [facility]  To contact the attending provider between 7A-7P or the covering provider during after hours 7P-7A, please log into the web site [email or web address] and access using universal [place] password for that web site. If you do not have the  password, please call the hospital operator.

## 2019-06-11 NOTE — Progress Notes (Signed)
Physical Therapy Treatment Patient Details Name: Kayla Price MRN: [id number] DOB: [birth year] Today's Date: 06/11/2019    History of Present Illness Kayla Price is a 63 y.o. female with medical history significant of metastatic breast cancer on chemotherapy, HIV disease, hyperlipidemia, chronic kidney disease stage II, hypertension, anemia of chronic disease, protein calorie malnutrition and s/p d/c home after 4 day admissinon for AKI and buttock wound.  She is admitted with continued weakness and with wound that could not be managed at home and urinary retention.    PT Comments    Pt declining OOB due to waiting for nurse to remove foley. Session, therefore, focused on exercises. Pt performed BUE/LE exercises in supine. She required 2 rest breaks due to SOB. Max HR 116 and HR 106 at end of session.    Follow Up Recommendations  SNF     Equipment Recommendations  None recommended by PT    Recommendations for Other Services       Precautions / Restrictions Precautions Precautions: Fall;Other (comment) Precaution Comments: watch HR    Mobility  Bed Mobility                  Transfers                    Ambulation/Gait                 Stairs             Wheelchair Mobility    Modified Rankin (Stroke Patients Only)       Balance                                            Cognition Arousal/Alertness: Awake/alert Behavior During Therapy: WFL for tasks assessed/performed Overall Cognitive Status: Within Functional Limits for tasks assessed                                        Exercises General Exercises - Upper Extremity Shoulder Flexion: AROM;Both;10 reps;Supine Elbow Flexion: AROM;Both;10 reps;Supine General Exercises - Lower Extremity Ankle Circles/Pumps: AROM;Both;10 reps;Supine Gluteal Sets: AROM;Both;10 reps;Supine Heel Slides: AROM;Right;Left;10 reps;Supine Hip  ABduction/ADduction: AROM;Right;Left;10 reps;Supine    General Comments General comments (skin integrity, edema, etc.): max HR 116      Pertinent Vitals/Pain Pain Assessment: Faces Faces Pain Scale: Hurts little more Pain Location: generalized with mobility Pain Descriptors / Indicators: Grimacing Pain Intervention(s): Limited activity within patient's tolerance    Home Living                      Prior Function            PT Goals (current goals can now be found in the care plan section) Acute Rehab PT Goals Patient Stated Goal: to get stronger Progress towards PT goals: Progressing toward goals    Frequency    Min 2X/week      PT Plan Current plan remains appropriate    Co-evaluation              AM-PAC PT "6 Clicks" Mobility   Outcome Measure  Help needed turning from your back to your side while in a flat bed without using bedrails?: A Lot Help needed moving from lying on your back to  sitting on the side of a flat bed without using bedrails?: A Lot Help needed moving to and from a bed to a chair (including a wheelchair)?: Total Help needed standing up from a chair using your arms (e.g., wheelchair or bedside chair)?: Total Help needed to walk in hospital room?: Total Help needed climbing 3-5 steps with a railing? : Total 6 Click Score: 8    End of Session Equipment Utilized During Treatment: Gait belt Activity Tolerance: Patient tolerated treatment well Patient left: in bed;with call bell/phone within reach   PT Visit Diagnosis: Other abnormalities of gait and mobility (R26.89);Muscle weakness (generalized) (M62.81);Pain     Time: 1051-1106 PT Time Calculation (min) (ACUTE ONLY): 15 min  Charges:  $Therapeutic Exercise: 8-22 mins                     Lorrin Goodell, PT  Office # [phone] Pager [phone]    Lorriane Shire 06/11/2019, 11:30 AM

## 2019-06-11 NOTE — Progress Notes (Signed)
ANTICOAGULATION CONSULT NOTE - Initial Consult  Pharmacy Consult for Lovenox Indication: DVT  Allergies  Allergen Reactions  . Lisinopril Anaphylaxis, Swelling and Other (See Comments)    Swelling of tongue and mouth 11/05/16- tolerates Olmesartan  . Pepcid [Famotidine] Other (See Comments)    PPI H2, BLOCKERS LOWER GASTRIC PH WHICH WOULD LEAD TO SUBTHERAPEUTIC RILPIVIRINE LEVELS AND POTENTIAL VIROLOGICAL FAILURE WITH RESISTANCE  . Prilosec [Omeprazole] Other (See Comments)    PPI H2, BLOCKERS LOWER GASTRIC PH WHICH WOULD LEAD TO SUBTHERAPEUTIC RILPIVIRINE LEVELS AND POTENTIAL VIROLOGICAL FAILURE WITH RESISTANCE  . Tums [Calcium Carbonate Antacid] Other (See Comments)    TUMS ANTACIDS CAN LOWER GASTRIC PH WHICH COULD  LEAD TO SUBTHERAPEUTIC RILPIVIRINE LEVELS AND POTENTIAL VIROLOGICAL FAILURE WITH RESISTANCE TUMS CAN BE GIVEN BUT NEED CONSULT WITH [facility] PHARMACY RE TIMING. I PREFER HER TO AVOID ALL TOGETHER    Patient Measurements: Height: 5\' 7"  (170.2 cm) Weight: 198 lb 14.4 oz (90.2 kg) IBW/kg (Calculated) : 61.6  Vital Signs: Temp: 98.2 F (36.8 C) (12/17 0805) Temp Source: Oral (12/17 0805) BP: 111/73 (12/17 0805) Pulse Rate: 100 (12/17 0805)  Labs: Recent Labs    06/09/19 0423 06/09/19 0742 06/10/19 0359 06/11/19 0422  HGB 7.9*  --  8.5* 8.1*  HCT 23.7*  --  26.1* 25.5*  PLT 123*  --  151 182  CREATININE  --  1.09* 1.03*  --     Estimated Creatinine Clearance: 64.4 mL/min (A) (by C-G formula based on SCr of 1.03 mg/dL (H)).   Medical History: Past Medical History:  Diagnosis Date  . AKI (acute kidney injury) ([place]) 06/02/2019  . Alopecia areata 11/28/2009  . Bell's palsy   . Cancer [facility]) 2005   Breast cancer   chemotherapy and radiation  . CKD (chronic kidney disease) stage 3, GFR 30-59 ml/min 06/08/2015  . Dry eye syndrome   . Family history of lung cancer   . Family history of non-Hodgkin's lymphoma   . Fasting hyperglycemia   . Gestational diabetes    2001   . HIP FRACTURE, RIGHT 05/06/2008  . History of kidney stones   . HIV DISEASE 03/27/2006  . HYPERLIPIDEMIA, MIXED 12/15/2007  . HYPERTENSION 03/27/2006  . 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 MENORRHAGIA, POSTMENOPAUSAL 02/03/2009  . Osteoarthritis of left knee 06/08/2015  . OSTEOARTHROSIS, LOCAL, SCND, UNSPC SITE 04/07/2007  . Personal history of chemotherapy 11/2018  . PVD 04/07/2007  . Tinea capitis   . TRIGGER FINGER 05/06/2008  . Unspecified vitamin D deficiency 08/06/2007    [name] Pharmacy consulted for Lovenox therapy.   Patient has pancytopenia - Hemoglobin is stable at 8. Platelets improving, now >100.  BMI is 30. SCr is trending down at 1 with estimated CrCl ~ 64 mL/min.   Awaiting dispo and discharge med plan for long term anticoagulation.  Could continue Lovenox indefinitely or switch to reduced dose apixaban given DDI with HIV meds.   Goal of Therapy:  Monitor platelets by anticoagulation protocol: Yes   Plan:  Cont Lovenox 1 mg/kg (90mg ) SQ BID.  Monitor CBC closely with pancytopenia.  Monitor SCr for any need to adjust dose.   [employer], Pharm.D., BCPS Clinical Pharmacist Clinical phone for 06/11/2019 from 8:30-4:00 is [phone].  **Pharmacist phone directory can be found on [email or web address] listed under [place].  06/11/2019 11:15 AM

## 2019-06-12 ENCOUNTER — Encounter: Payer: Medicare HMO | Admitting: Nurse Practitioner

## 2019-06-12 ENCOUNTER — Inpatient Hospital Stay (HOSPITAL_COMMUNITY): Payer: Medicare HMO

## 2019-06-12 ENCOUNTER — Other Ambulatory Visit: Payer: Self-pay | Admitting: Oncology

## 2019-06-12 DIAGNOSIS — I340 Nonrheumatic mitral (valve) insufficiency: Secondary | ICD-10-CM

## 2019-06-12 DIAGNOSIS — I361 Nonrheumatic tricuspid (valve) insufficiency: Secondary | ICD-10-CM

## 2019-06-12 LAB — CBC
HCT: 25.5 % — ABNORMAL LOW (ref 36.0–46.0)
Hemoglobin: 8.1 g/dL — ABNORMAL LOW (ref 12.0–15.0)
MCH: 30.9 pg (ref 26.0–34.0)
MCHC: 31.8 g/dL (ref 30.0–36.0)
MCV: 97.3 fL (ref 80.0–100.0)
Platelets: 233 10*3/uL (ref 150–400)
RBC: 2.62 MIL/uL — ABNORMAL LOW (ref 3.87–5.11)
RDW: 20 % — ABNORMAL HIGH (ref 11.5–15.5)
WBC: 6.5 10*3/uL (ref 4.0–10.5)
nRBC: 2.9 % — ABNORMAL HIGH (ref 0.0–0.2)

## 2019-06-12 LAB — COMPREHENSIVE METABOLIC PANEL
ALT: 22 U/L (ref 0–44)
AST: 28 U/L (ref 15–41)
Albumin: 1.9 g/dL — ABNORMAL LOW (ref 3.5–5.0)
Alkaline Phosphatase: 54 U/L (ref 38–126)
Anion gap: 7 (ref 5–15)
BUN: 13 mg/dL (ref 8–23)
CO2: 28 mmol/L (ref 22–32)
Calcium: 8.4 mg/dL — ABNORMAL LOW (ref 8.9–10.3)
Chloride: 102 mmol/L (ref 98–111)
Creatinine, Ser: 1.21 mg/dL — ABNORMAL HIGH (ref 0.44–1.00)
GFR calc Af Amer: 55 mL/min — ABNORMAL LOW (ref >60)
GFR calc non Af Amer: 48 mL/min — ABNORMAL LOW (ref >60)
Glucose, Bld: 96 mg/dL (ref 70–99)
Potassium: 3.5 mmol/L (ref 3.5–5.1)
Sodium: 137 mmol/L (ref 135–145)
Total Bilirubin: 0.4 mg/dL (ref 0.3–1.2)
Total Protein: 5.3 g/dL — ABNORMAL LOW (ref 6.5–8.1)

## 2019-06-12 LAB — ECHOCARDIOGRAM COMPLETE
Height: 67 in
Weight: 3182.38 oz

## 2019-06-12 MED ORDER — SODIUM CHLORIDE 0.9 % IV SOLN
2.0000 g | Freq: Two times a day (BID) | INTRAVENOUS | Status: DC
  Administered 2019-06-12 – 2019-06-13 (×2): 2 g via INTRAVENOUS
  Filled 2019-06-12 (×3): qty 2

## 2019-06-12 MED ORDER — VANCOMYCIN HCL 1750 MG/350ML IV SOLN
1750.0000 mg | Freq: Once | INTRAVENOUS | Status: AC
  Administered 2019-06-12: 18:00:00 1750 mg via INTRAVENOUS
  Filled 2019-06-12: qty 350

## 2019-06-12 MED ORDER — VANCOMYCIN HCL 1250 MG/250ML IV SOLN
1250.0000 mg | INTRAVENOUS | Status: DC
  Administered 2019-06-14: 1250 mg via INTRAVENOUS
  Filled 2019-06-12: qty 250

## 2019-06-12 NOTE — Progress Notes (Signed)
Patient still in hospital.

## 2019-06-12 NOTE — Progress Notes (Addendum)
[facility]  PROGRESS NOTE  [name] JXB:[id number] DOB: October 08, 1955 DOA: 06/01/2019 PCP: Lauree Chandler, NP Admit date - 06/01/2019   Admitting Physician Elwyn Reach, MD  Outpatient Primary MD for the patient is Lauree Chandler, NP  LOS - 9 Brief Narrative   Kayla Price is a 63 y.o. 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  In the ED findings notable for blood pressure 88/64, WBC 2.4, platelets 54, creatinine 1.75 (baseline 1.1), lactic acid within normal limits.  Patient was given IV fluids and admitted with working diagnosis of AKI, hypotension, and decubitus ulcerations of buttocks requiring aggressive wound care.  Hospital course complicated by superficial skin breakdown of groin/perineal area consistent with intertrigo/fungal infection with no signs of decubitus ulcers for which wound care saw and recommended appropriate dressings with nystatin powder/Desitin.  Given appearance of lesions for blue staining HSV PCR was sent, returned positive.  Patient was started on Valtrex on 12/11.  Patient started on empiric IV ceftriaxone doxycycline for presumed community-acquired pneumonia given chest x-ray findings, patient started on Lovenox for extensive right-sided DVT on 12/14.  CTA chest negative for pulmonary embolism.  Progressively worsening dyspnea and hypoxia since 12/17.  On 12/18, consulted PCCM who transferred patient to progressive care unit for BiPAP.  Oncology followed up on 12/18, noted poor prognosis and have requested palliative care consult and transitioning to morphine drip  for comfort if supportive measures fail.  Subjective  Patient interviewed and examined along with her RN in room.  Significantly worsened respiratory distress compared to yesterday.  Patient however is a less reliable historian.  She continues to deny dyspnea most times and then says she may have some dyspnea.  No chest pain reported.  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  - Lesions look less raw and patient reports much better in terms of pain. HSV-2 PCR of skin returned +  --Valtrex 1 g TID PO x 7 days, d/c fluconazole --f/u with ID as outpt arranged -Keeping this area clean/dry is key, -Discontinued Foley catheter on 12/17.  Reportedly voiding well. -Improving.  Right lower extremity DVT.  Unilateral edema in setting of patient with known metastatic cancer.  Venous duplex positive for DVT -Initial plan to start Xarelto, but has interactions with HIV medications, so started on lovenox -In the interim we will continue Lovenox -Continue to closely monitor CBC given pancytopenia, no signs or symptoms of bleeding - CTA chest negative for PE.  Acute hypoxic respiratory failure.    Initially treated for community-acquired pneumonia with IV ceftriaxone and doxycycline.  CTA chest was negative for PE but showed findings of metastatic cancer.  She was noted to be dyspneic yesterday and tried a dose of IV Lasix 20 mg x 1 without much improvement.  On 12/18, noted worsening dyspnea and hypoxia, requiring 4 L/min oxygen, tachypneic.  Repeat chest x-ray shows left lower lobe consolidation with effusion.  Consulted and discussed with PCCM.  DD: Pneumonia, pulmonary edema, lymphangitis carcinoma.  Patient transferred to progressive care unit on 12/18, starting BiPAP as needed.  CODE STATUS was verified by her oncologist and confirmed  to be DNR.  Antibiotics broadened to IV cefepime and vancomycin.  Checking TTE.  AKI on CKD, with urinary  retention, back to baseline.  Creatinine improving with Foley in place.  Likely acute exacerbation in setting of brief hypotension and continue use of Benicar/also discontinued on recent hospitalization.  Renal ultrasound  on12/3 was negative for hydronephrosis -Avoid nephrotoxins -Creatinine has slightly bumped up from 1.03-1.21, likely related to IV Lasix 20 mg x 1 last night.  Follow BMP.  Hypotension, Stable . Decadron discontinued given random cortisol was 19, not consistent with adrenal insufficiency.  Patient previously on Decadron for taper related to malignancy. -Closely monitor BP off steroids -Recommend discontinuation of Benicar on discharge -Hypotension resolved.  Hypothyroidism, stable -Continue Synthroid  Physical deconditioning.  Prior level of independence: Independent with assistive devices.  Pain from groin site previously limited ambulation, but much better  in pain control and healing of skin site.  PT have noticed improvement in her strength and continue to believe patient would benefit from SNF level rehab upon D/C, social worker consulted to assist -encourage OOB to chair with assistance -PT recommends SNF, I agree SNF rehab would be beneficial  Given her prior level of independence and improvement in her pain that was previously limiting her --PT evaluated 12/17 and continue to recommend SNF.  Patient medically unstable for discharge at this time.  HIV, controlled.  CD4 greater than 400 on 10/26 -Continue darunavir -cobicistat, dolutegravir -Discuss with ID if able to change medications to decrease interactions with potential anticoagulation  Recent E. coli UTI.  Diagnosed on previous hospital course. -Completed full course of cefdinir.  Pancytopenia, likely chemotherapy associated.  Stable.  No signs or symptoms of bleeding -Leukopenia and thrombocytopenia have resolved.  Anemia stable.  Metastatic breast cancer, metastases to the brain(04/2019) and right pleural  space(03/2018)   Status post whole brain radiation (11/5-11/18/2020 -Followed by Dr. Jana Hakim, plan for outpatient PET scan to determine next step in treatment -Dr. Virgie Dad follow-up appreciated.   Family Communication  : None at bedside. I discussed in detail with patient's daughter, updated care and answered questions.  Updated her regarding the critical nature of her mother's illness and overall poor prognosis.  Code Status :  DNR  Disposition Plan  : Requiring IV antibiotics for recently diagnosed pneumonia.  PT recommends SNF given need for moderate-max assistance for bed mobility and transfers.  Previous baseline ambulatory with occasional use of cane, pain better controlled today.   Consults  :  Dr. Mariah Milling, [place], PCCM 12/18.  Procedures  :  none  DVT Prophylaxis  :   SCDs/Lovenox-full dose    Inpatient Medications Scheduled Meds: . Chlorhexidine Gluconate Cloth  6 each Topical Daily  . darunavir-cobicistat  1 tablet Oral QHS  . dolutegravir  50 mg Oral QHS   And  . rilpivirine  25 mg Oral QHS  . enoxaparin (LOVENOX) injection  90 mg Subcutaneous Q12H  . feeding supplement (ENSURE ENLIVE)  237 mL Oral TID BM  . levothyroxine  200 mcg Oral QAC breakfast  . maraviroc  150 mg Oral BID  . multivitamin with minerals  1 tablet Oral Daily   Continuous Infusions: . sodium chloride 10 mL/hr at 06/10/19 2141   PRN Meds:.sodium chloride, acetaminophen **OR** acetaminophen, calcium carbonate (dosed in mg elemental calcium), camphor-menthol **AND** hydrOXYzine, docusate sodium, HYDROcodone-acetaminophen, ondansetron **OR** ondansetron (ZOFRAN) IV, sodium chloride flush, sorbitol, zolpidem  Antibiotics  :   Anti-infectives (From admission, onward)   Start     Dose/Rate  Route Frequency Ordered Stop   06/10/19 0600  cefTRIAXone (ROCEPHIN) 1 g in sodium chloride 0.9 % 100 mL IVPB  Status:  Discontinued     1 g 200 mL/hr over 30 Minutes Intravenous Every 24 hours 06/09/19  0732 06/12/19 1335   06/09/19 0800  doxycycline (VIBRAMYCIN) 100 mg in sodium chloride 0.9 % 250 mL IVPB  Status:  Discontinued     100 mg 125 mL/hr over 120 Minutes Intravenous Every 12 hours 06/09/19 0735 06/12/19 1335   06/09/19 0745  cefTRIAXone (ROCEPHIN) 1 g in sodium chloride 0.9 % 100 mL IVPB     1 g 200 mL/hr over 30 Minutes Intravenous  Once 06/09/19 0738 06/09/19 0924   06/05/19 2200  valACYclovir (VALTREX) tablet 1,000 mg     1,000 mg Oral 2 times daily 06/05/19 1418 06/12/19 1101   06/05/19 1000  valACYclovir (VALTREX) tablet 1,000 mg  Status:  Discontinued     1,000 mg Oral 3 times daily 06/05/19 0834 06/05/19 1418   06/02/19 2200  darunavir-cobicistat (PREZCOBIX) 800-150 MG per tablet 1 tablet     1 tablet Oral Daily at bedtime 06/02/19 0950     06/02/19 2200  dolutegravir (TIVICAY) tablet 50 mg     50 mg Oral Daily at bedtime 06/02/19 1455     06/02/19 2200  rilpivirine (EDURANT) tablet 25 mg     25 mg Oral Daily at bedtime 06/02/19 1455     06/02/19 1700  dolutegravir (TIVICAY) tablet 50 mg  Status:  Discontinued     50 mg Oral Daily with supper 06/02/19 1453 06/02/19 1456   06/02/19 1700  rilpivirine (EDURANT) tablet 25 mg  Status:  Discontinued     25 mg Oral Daily with supper 06/02/19 1453 06/02/19 1456   06/02/19 1415  fluconazole (DIFLUCAN) IVPB 200 mg  Status:  Discontinued     200 mg 100 mL/hr over 60 Minutes Intravenous Every 24 hours 06/02/19 1402 06/05/19 0834   06/02/19 1100  maraviroc (SELZENTRY) tablet 150 mg     150 mg Oral 2 times daily 06/02/19 0950     06/02/19 1000  cefdinir (OMNICEF) capsule 300 mg     300 mg Oral 2 times daily 06/02/19 0950 06/04/19 2207   06/02/19 1000  fluconazole (DIFLUCAN) tablet 400 mg  Status:  Discontinued     400 mg Oral Daily 06/02/19 0950 06/02/19 1402       Objective   Vitals:   06/12/19 0846 06/12/19 0854 06/12/19 1432 06/12/19 1435  BP: 128/76 128/76    Pulse: 98 95 (!) 103 (!) 103  Resp: (!) 24 (!) 24 (!) 22  (!) 24  Temp: 98.4 F (36.9 C)     TempSrc: Oral     SpO2: 92% 97% 99% 95%  Weight:      Height:        SpO2: 95 % O2 Flow Rate (L/min): 4 L/min FiO2 (%): 40 %  Wt Readings from Last 3 Encounters:  06/10/19 90.2 kg  05/31/19 90.7 kg  05/19/19 88 kg     Intake/Output Summary (Last 24 hours) at 06/12/2019 1459 Last data filed at 06/12/2019 0610 Gross per 24 hour  Intake 941.76 ml  Output 1475 ml  Net -533.24 ml    Physical Exam:  General exam: Pleasant middle-aged female, looks much worse compared to yesterday, sitting upright in bed with increased work of breathing and active accessory muscles. RS: Reduced breath sounds bilaterally, especially in the left base.  Scattered few  bibasilar crackles.  No wheezing or rhonchi.  Dyspneic even while talking. CVS: S1 and S2 heard, RRR.  No JVD, murmurs or pedal edema.  Telemetry personally reviewed: Sinus rhythm. Abdominal exam: Nondistended, soft and nontender.  No organomegaly or masses appreciated. Skin: As examined on 12/16: Multiple ulcers in the groin/perivulval/upper thigh area which appear clean.  Reportedly improving compared to prior as per nursing and patient report. CNS: Alert and oriented x2.  No focal neurological deficits. Psychiatry: Flat affect.  Impaired judgment and insight. Extremities: Right lower extremity mildly diffusely swollen compared to left without any other acute findings.  Likely related to DVT.     I have personally reviewed the following:   Data Reviewed:  CBC Recent Labs  Lab 06/08/19 2104 06/09/19 0423 06/10/19 0359 06/11/19 0422 06/12/19 0455  WBC 3.9* 4.1 5.9 5.9 6.5  HGB 7.9* 7.9* 8.5* 8.1* 8.1*  HCT 23.8* 23.7* 26.1* 25.5* 25.5*  PLT 120* 123* 151 182 233  MCV 93.7 94.0 93.9 98.1 97.3  MCH 31.1 31.3 30.6 31.2 30.9  MCHC 33.2 33.3 32.6 31.8 31.8  RDW 19.2* 19.3* 19.4* 19.6* 20.0*    Chemistries  Recent Labs  Lab 06/06/19 0744 06/09/19 0742 06/10/19 0359 06/12/19 0455    NA 131* 135 135 137  K 3.4* 4.0 3.7 3.5  CL 99 101 100 102  CO2 24 26 25 28   GLUCOSE 101* 105* 110* 96  BUN 13 10 11 13   CREATININE 1.21* 1.09* 1.03* 1.21*  CALCIUM 8.0* 8.6* 8.5* 8.4*  AST  --   --   --  28  ALT  --   --   --  22  ALKPHOS  --   --   --  54  BILITOT  --   --   --  0.4   ------------------------------------------------------------------------------------------------------------------ No results for input(s): CHOL, HDL, LDLCALC, TRIG, CHOLHDL, LDLDIRECT in the last 72 hours.  Lab Results  Component Value Date   HGBA1C 6.1 03/07/2019   - Micro Results Recent Results (from the past 240 hour(s))  Hsv Culture And Typing     Status: Abnormal   Collection Time: 06/02/19  5:48 PM   Specimen: Skin, Other  Result Value Ref Range Status   HSV Culture/Type Comment (A)  Final    Comment: (NOTE) Positive for Herpes simplex virus type-2. Typing was confirmed by monoclonal antibody microscopic immunofluorescence. Performed At: [facility] [place] [place], [place] [id number] Rush Farmer MD PP:[id number]    Source of Sample SKIN  Final    Comment: Performed at [facility], [place] [address] [place], [place] [address]  Culture, blood (Routine X 2) w Reflex to ID Panel     Status: None (Preliminary result)   Collection Time: 06/09/19  7:50 AM   Specimen: BLOOD  Result Value Ref Range Status   Specimen Description BLOOD LEFT ANTECUBITAL  Final   Special Requests   Final    BOTTLES DRAWN AEROBIC ONLY Blood Culture results may not be optimal due to an inadequate volume of blood received in culture bottles   Culture   Final    NO GROWTH 2 DAYS Performed at [facility], [place] [address], [place], [place] [address]    Report Status PENDING  Incomplete  Culture, blood (Routine X 2) w Reflex to ID Panel     Status: None (Preliminary result)   Collection Time: 06/09/19  7:55 AM   Specimen: BLOOD LEFT HAND  Result Value Ref Range Status  Specimen  Description BLOOD LEFT HAND  Final   Special Requests   Final    BOTTLES DRAWN AEROBIC ONLY Blood Culture results may not be optimal due to an inadequate volume of blood received in culture bottles   Culture   Final    NO GROWTH 2 DAYS Performed at [facility], [place] [address] [place], [place] [address]    Report Status PENDING  Incomplete    Radiology Reports CT ANGIO CHEST PE W OR WO CONTRAST  Result Date: 06/09/2019 CLINICAL DATA:  Shortness of breath.  Metastatic breast cancer. EXAM: CT ANGIOGRAPHY CHEST WITH CONTRAST TECHNIQUE: Multidetector CT imaging of the chest was performed using the standard protocol during bolus administration of intravenous contrast. Multiplanar CT image reconstructions and MIPs were obtained to evaluate the vascular anatomy. CONTRAST:  47mL OMNIPAQUE IOHEXOL 350 MG/ML SOLN COMPARISON:  PET CT 03/11/2019.  Chest CT 04/22/2018 FINDINGS: Cardiovascular: No filling defects in the pulmonary arteries to suggest pulmonary emboli. Tortuous, ectatic aorta with maximum diameter 3.7 cm. No dissection. Heart is enlarged. Mediastinum/Nodes: Extensive adenopathy in the mediastinum. Surgical clips in the right axilla. This is unchanged since recent PET CT. Lungs/Pleura: Extensive pleural nodularity/disease throughout right hemithorax, similar to recent PET CT. Right middle lobe mass is unchanged. Central necrotic portion again noted, unchanged. Scattered ground-glass airspace disease noted within both lungs concerning for pneumonia. Upper Abdomen: Gastrohepatic ligament lymph nodes again noted, unchanged. No acute findings. Musculoskeletal: Prior right mastectomy. Right chest wall Port-A-Cath remains in place, unchanged. Sclerotic lesion within the T11 vertebral body is stable. Review of the MIP images confirms the above findings. IMPRESSION: No evidence of pulmonary embolus. Extensive mediastinal adenopathy, right pleural metastatic disease, and right middle lobe mass are all  stable since prior PET CT. Worsening ground-glass airspace disease throughout both lungs concerning for pneumonia. Cardiomegaly. Tortuous, ectatic thoracic aorta. Electronically Signed   By: Rolm Baptise M.D.   On: 06/09/2019 17:44   US RENAL  Result Date: 05/28/2019 CLINICAL DATA:  Acute kidney injury. EXAM: RENAL / URINARY TRACT ULTRASOUND COMPLETE COMPARISON:  PET CT scan 03/11/2019. FINDINGS: Right Kidney: Renal measurements: 9.9 x 4.3 x 5.0 cm = volume: 109.7 mL . Echogenicity within normal limits. No mass or hydronephrosis visualized. Left Kidney: Renal measurements: 9.6 x 5.4 x 4.9 cm = volume: Is 134.3. mL. Echogenicity within normal limits. No mass or hydronephrosis visualized. Bladder: Appears normal for degree of bladder distention. Other: Small right pleural effusion and gallstones are noted as seen on the prior exam. IMPRESSION: Negative for hydronephrosis.  Normal appearing kidneys. Right pleural effusion. Gallstones. Electronically Signed   By: Inge Rise M.D.   On: 05/28/2019 07:15   DG Chest Port 1 View  Result Date: 06/12/2019 CLINICAL DATA:  Hypoxia. Metastatic breast cancer. EXAM: PORTABLE CHEST 1 VIEW COMPARISON:  CTA chest 06/09/2019. One-view chest x-ray 06/08/2019. FINDINGS: The heart size is normal. The heart is enlarged. Right IJ Port-A-Cath is stable. A right pleural effusion has slightly increased. Right greater than left airspace opacities have progressed. Surgical clips are noted in the right axilla. IMPRESSION: 1. Progressive right greater than left airspace disease compatible with pneumonia. 2. Slight increase in right pleural effusion. 3. Stable right IJ Port-A-Cath. Electronically Signed   By: San Morelle M.D.   On: 06/12/2019 09:29   DG CHEST PORT 1 VIEW  Result Date: 06/08/2019 CLINICAL DATA:  Hypoxia EXAM: PORTABLE CHEST 1 VIEW COMPARISON:  07/10/2018 FINDINGS: Right Port-A-Cath in place with the tip in the SVC.  Airspace disease in the right lower  lung concerning for pneumonia. No confluent opacity on the left. Heart is borderline in size. Possible small right effusion. No acute bony abnormality. IMPRESSION: Right lower lung airspace disease concerning for pneumonia. Small right effusion suspected. Electronically Signed   By: Rolm Baptise M.D.   On: 06/08/2019 18:55   VAS [place] LOWER EXTREMITY VENOUS (DVT)  Result Date: 06/09/2019  Lower Venous Study Indications: Swelling.  Risk Factors: Cancer History of metastatic breast cancer. Comparison Study: No prior study on file Performing Technologist: Sharion Dove RVS  Examination Guidelines: A complete evaluation includes B-mode imaging, spectral Doppler, color Doppler, and power Doppler as needed of all accessible portions of each vessel. Bilateral testing is considered an integral part of a complete examination. Limited examinations for reoccurring indications may be performed as noted.  +---------+---------------+---------+-----------+----------+--------------+ RIGHT    CompressibilityPhasicitySpontaneityPropertiesThrombus Aging +---------+---------------+---------+-----------+----------+--------------+ CFV      Partial        Yes      Yes                  Acute          +---------+---------------+---------+-----------+----------+--------------+ SFJ      Full                                                        +---------+---------------+---------+-----------+----------+--------------+ FV Prox  None                                         Acute          +---------+---------------+---------+-----------+----------+--------------+ FV Mid   None                                         Acute          +---------+---------------+---------+-----------+----------+--------------+ FV DistalNone                                         Acute          +---------+---------------+---------+-----------+----------+--------------+ PFV      Full                                                         +---------+---------------+---------+-----------+----------+--------------+ POP      None           No       No                   Acute          +---------+---------------+---------+-----------+----------+--------------+ PTV      None                                         Acute          +---------+---------------+---------+-----------+----------+--------------+  PERO     None                                         Acute          +---------+---------------+---------+-----------+----------+--------------+ EIV                     Yes      Yes                                 +---------+---------------+---------+-----------+----------+--------------+   +----+---------------+---------+-----------+----------+--------------+ LEFTCompressibilityPhasicitySpontaneityPropertiesThrombus Aging +----+---------------+---------+-----------+----------+--------------+ CFV Full           Yes      Yes                                 +----+---------------+---------+-----------+----------+--------------+     Summary: Right: Findings consistent with acute deep vein thrombosis involving the right common femoral vein, right femoral vein, right popliteal vein, right posterior tibial veins, and right peroneal veins. Left: No evidence of common femoral vein obstruction.  *See table(s) above for measurements and observations. Electronically signed by Harold Barban MD on 06/09/2019 at 8:27:06 AM.    Final     Vernell Leep, MD, [place], [facility]. [facility]  To contact the attending provider between 7A-7P or the covering provider during after hours 7P-7A, please log into the web site [email or web address] and access using universal [place] password for that web site. If you do not have the password, please call the hospital operator.

## 2019-06-12 NOTE — Progress Notes (Signed)
Spoke to patient's daughter Addison Lank her aware of pending transfer out  to higher level of care.

## 2019-06-12 NOTE — Consult Note (Addendum)
NAME:  Kayla Price, MRN:  [id number], DOB:  21-May-1956, LOS: 9 ADMISSION DATE:  06/01/2019, CONSULTATION DATE:  12/18 REFERRING MD:  Dr. Algis Liming, CHIEF COMPLAINT:  Dypsnea  Brief History   63 year old female with HIV and metastatic breast cancer admitted with AKI. Course complicated by dyspnea requiring supplemental O2. CT with diffuse infiltrates. PCCM consulted.   History of present illness   63 year old female with past medical history as below, which is significant for breast cancer with metastasis to the brain and right pleura on current chemotherapy and status post whole brain radiation, HIV, CKD, HSV, and recent admission for acute kidney injury and hypotension.  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  She was discharged on 12/6 and was readmitted 1 day later with complaints of worsening drainage from those lesions.  In the emergency department she was also noted to be hypotensive with worsening renal indices.  She was admitted to the hospitalist service.  Hospital course since that time has been complicated by respiratory distress and imaging was concerning for pneumonia.  She was started on empiric ceftriaxone and doxycycline for presumed community-acquired pneumonia.  She was also found to have extensive right-sided DVT on 12/14 and was treated with Lovenox.  CT angiogram was done to assess for pulmonary embolism in the setting of ongoing dyspnea with now known DVT.  No PE was found but she was discovered to have diffuse interstitial prominence with question of consolidation as well.  PCCM was consulted for further evaluation.   She reports dyspnea started about the time of her first admission on 12/4 and has been progressive since that time. Denies chest pain, productive cough, fevers, chills, PND, and swallowing issues. She endorses orthopnea and occasional feet swelling.   Past Medical History   has a past medical history of AKI  (acute kidney injury) ([place]) (06/02/2019), Alopecia areata (11/28/2009), Bell's palsy, Cancer ([place]) (2005), CKD (chronic kidney disease) stage 3, GFR 30-59 ml/min (06/08/2015), Dry eye syndrome, Family history of lung cancer, Family history of non-Hodgkin's lymphoma, Fasting hyperglycemia, Gestational diabetes, HIP FRACTURE, RIGHT (05/06/2008), History of kidney stones, HIV DISEASE (03/27/2006), HYPERLIPIDEMIA, MIXED (12/15/2007), HYPERTENSION (03/27/2006), HYPOTHYROIDISM, POST-RADIATION (06/28/2008), MENORRHAGIA, POSTMENOPAUSAL (02/03/2009), Osteoarthritis of left knee (06/08/2015), OSTEOARTHROSIS, LOCAL, SCND, UNSPC SITE (04/07/2007), Personal history of chemotherapy (11/2018), PVD (04/07/2007), Tinea capitis, TRIGGER FINGER (05/06/2008), and Unspecified vitamin D deficiency (08/06/2007).   Significant Hospital Events   12/3 >12/6 admit for AKI and perineal ulcers 12/7 admit for worsening ulcer drainage and hypotension. 12/14 developed dyspnea, CXR concerning for PNA, started on CTX, doxy 12/18 PCCM consulted for worsening hypoxia and abnormal CT.    Consults:  PCCM  Procedures:    Significant Diagnostic Tests:  CTA chest 12/15 > No evidence of pulmonary embolus. Extensive mediastinal adenopathy, right pleural metastatic disease, and right middle lobe mass are all stable since prior PET CT. Worsening ground-glass airspace disease throughout both lungs concerning for pneumonia.  Micro Data:  COVID 12/7 negative Blood 12/15 >>> Sputum 12/15 > HSV skin/wound  > Postiive  Antimicrobials:  Ceftriaxone 12/15 >12/18 Doxycycline 12/15 >12/18 Cefepime 12/18 > Vancomycin 12/18 >  Interim history/subjective:    Objective   Blood pressure 128/76, pulse 95, temperature 98.4 F (36.9 C), temperature source Oral, resp. rate (!) 24, height _0  (1.702 m), weight 90.2 kg, last menstrual period 11/06/2003, SpO2 97 %.        Intake/Output Summary (Last 24 hours) at 06/12/2019 1204 Last  data filed at  06/12/2019 0610 Gross per 24 hour  Intake 1141.76 ml  Output 1575 ml  Net -433.24 ml   Filed Weights   06/07/19 0500 06/08/19 0500 06/10/19 2026  Weight: 90.2 kg 90.2 kg 90.2 kg    Examination: General: Frail female in bed, dyspneic HENT: [place]/AT, PERRL, no appreciable JVD Lungs: Coarse crackles L>R. Diminished R. Dyspneic at rest. Mild distress.  Cardiovascular: RRR, no MRG Abdomen: Soft, non-tender, non-distended Extremities: No acute deformity or ROM limitation Neuro: Alert, oriented, nonfocal GU: Perineal wounds as documented in photos of [facility] progress notes.   Resolved Hospital Problem list     Assessment & Plan:   Acute hypoxemic respiratory failure: on 4L O2 and remains dyspneic. CT with diffuse interstitial process and areas of consolidation. Differential is broad and includes pneumonia, pulmonary edema, and lymphangitic metastasis. PE ruled out on CTA despite known LE DVT. She is dyspneic and describes orthopnea.  - Transfer to PCU for SpO2 monitoring - BiPAP PRN, she would be OK with intubation if reversible problem. No CPR, ACLS - Echocardiogram - Broaden ABX (immunocompormised with recent hospitalization) - Check PCT - Check CD4 - Consider swallowing evaluation - Other management per primary  Best practice:  Diet: Per primary Pain/Anxiety/Delirium protocol (if indicated): Per primary VAP protocol (if indicated): NA DVT prophylaxis: Full dose lovenox for DVT GI prophylaxis: per primary Glucose control: per primary Mobility: BR Code Status: DNR if arrests, intubation OK for presumed short term issue.  Family Communication: Patient updated bedside Disposition: to PCU for BIPAP  Labs   CBC: Recent Labs  Lab 06/08/19 2104 06/09/19 0423 06/10/19 0359 06/11/19 0422 06/12/19 0455  WBC 3.9* 4.1 5.9 5.9 6.5  HGB 7.9* 7.9* 8.5* 8.1* 8.1*  HCT 23.8* 23.7* 26.1* 25.5* 25.5*  MCV 93.7 94.0 93.9 98.1 97.3  PLT 120* 123* 151 182 419    Basic Metabolic  Panel: Recent Labs  Lab 06/06/19 0744 06/09/19 0742 06/10/19 0359 06/12/19 0455  NA 131* 135 135 137  K 3.4* 4.0 3.7 3.5  CL 99 101 100 102  CO2 _0 GLUCOSE 101* 105* 110* 96  BUN _1 CREATININE 1.21* 1.09* 1.03* 1.21*  CALCIUM 8.0* 8.6* 8.5* 8.4*   GFR: Estimated Creatinine Clearance: 54.8 mL/min (A) (by C-G formula based on SCr of 1.21 mg/dL (H)). Recent Labs  Lab 06/09/19 0423 06/10/19 0359 06/11/19 0422 06/12/19 0455  WBC 4.1 5.9 5.9 6.5    Liver Function Tests: Recent Labs  Lab 06/12/19 0455  AST 28  ALT 22  ALKPHOS 54  BILITOT 0.4  PROT 5.3*  ALBUMIN 1.9*   No results for input(s): LIPASE, AMYLASE in the last 168 hours. No results for input(s): AMMONIA in the last 168 hours.  ABG    Component Value Date/Time   PHART 7.378 05/31/2018 0623   PCO2ART 43.7 05/31/2018 0623   PO2ART 84.0 05/31/2018 0623   HCO3 25.8 05/31/2018 0623   TCO2 27 05/31/2018 0623   O2SAT 96.0 05/31/2018 0623     Coagulation Profile: No results for input(s): INR, PROTIME in the last 168 hours.  Cardiac Enzymes: No results for input(s): CKTOTAL, CKMB, CKMBINDEX, TROPONINI in the last 168 hours.  HbA1C: Hemoglobin A1C  Date/Time Value Ref Range Status  03/07/2019 12:00 AM 6.1  Final   Hgb A1c MFr Bld  Date/Time Value Ref Range Status  04/18/2018 09:32 AM 5.3 <5.7 % of total Hgb Final    Comment:  For the purpose of screening for the presence of diabetes: . <5.7%       Consistent with the absence of diabetes 5.7-6.4%    Consistent with increased risk for diabetes             (prediabetes) > or =6.5%  Consistent with diabetes . This assay result is consistent with a decreased risk of diabetes. . Currently, no consensus exists regarding use of hemoglobin A1c for diagnosis of diabetes in children. . According to American Diabetes Association (ADA) guidelines, hemoglobin A1c <7.0% represents optimal control in non-pregnant diabetic patients.  Different metrics may apply to specific patient populations.  Standards of Medical Care in Diabetes(ADA). .   05/28/2016 01:40 PM 5.2 <5.7 % Final    Comment:      For the purpose of screening for the presence of diabetes:   <5.7%       Consistent with the absence of diabetes 5.7-6.4 %   Consistent with increased risk for diabetes (prediabetes) >=6.5 %     Consistent with diabetes   This assay result is consistent with a decreased risk of diabetes.   Currently, no consensus exists regarding use of hemoglobin A1c for diagnosis of diabetes in children.   According to American Diabetes Association (ADA) guidelines, hemoglobin A1c <7.0% represents optimal control in non-pregnant diabetic patients. Different metrics may apply to specific patient populations. Standards of Medical Care in Diabetes (ADA).       CBG: No results for input(s): GLUCAP in the last 168 hours.  Review of Systems:   Negative except as outline in HPI  Past Medical History  She,  has a past medical history of AKI (acute kidney injury) ([place]) (06/02/2019), Alopecia areata (11/28/2009), Bell's palsy, Cancer ([place]) (2005), CKD (chronic kidney disease) stage 3, GFR 30-59 ml/min (06/08/2015), Dry eye syndrome, Family history of lung cancer, Family history of non-Hodgkin's lymphoma, Fasting hyperglycemia, Gestational diabetes, HIP FRACTURE, RIGHT (05/06/2008), History of kidney stones, HIV DISEASE (03/27/2006), HYPERLIPIDEMIA, MIXED (12/15/2007), HYPERTENSION (03/27/2006), HYPOTHYROIDISM, POST-RADIATION (06/28/2008), MENORRHAGIA, POSTMENOPAUSAL (02/03/2009), Osteoarthritis of left knee (06/08/2015), OSTEOARTHROSIS, LOCAL, SCND, UNSPC SITE (04/07/2007), Personal history of chemotherapy (11/2018), PVD (04/07/2007), Tinea capitis, TRIGGER FINGER (05/06/2008), and Unspecified vitamin D deficiency (08/06/2007).   Surgical History    Past Surgical History:  Procedure Laterality Date  . BREAST LUMPECTOMY Right    2005  . CHEST TUBE  INSERTION Right 05/30/2018   Procedure: INSERTION PLEURAL DRAINAGE CATHETER;  Surgeon: Grace Isaac, MD;  Location: [facility];  Service: Thoracic;  Laterality: Right;  . COLONOSCOPY    . COLONOSCOPY WITH ESOPHAGOGASTRODUODENOSCOPY (EGD)    . ENDOBRONCHIAL ULTRASOUND Bilateral 05/15/2018   Procedure: ENDOBRONCHIAL ULTRASOUND;  Surgeon: Collene Gobble, MD;  Location: [facility];  Service: Cardiopulmonary;  Laterality: Bilateral;  . FINE NEEDLE ASPIRATION BIOPSY  05/15/2018   Procedure: FINE NEEDLE ASPIRATION BIOPSY;  Surgeon: Collene Gobble, MD;  Location: [facility];  Service: Cardiopulmonary;;  . FLEXIBLE BRONCHOSCOPY  05/15/2018   Procedure: FLEXIBLE BRONCHOSCOPY;  Surgeon: Collene Gobble, MD;  Location: [facility];  Service: Cardiopulmonary;;  . HYSTEROSCOPY  2006  . IR IMAGING GUIDED PORT INSERTION  07/14/2018  . IR THORACENTESIS ASP PLEURAL SPACE W/IMG GUIDE  04/14/2018  . IR THORACENTESIS ASP PLEURAL SPACE W/IMG GUIDE  04/28/2018  . KNEE ARTHROSCOPY Left 2002  . LYMPH NODE DISSECTION  2005  . MASTECTOMY Right   . MASTECTOMY MODIFIED RADICAL Right 11/05/2016   Procedure: RIGHT MASTECTOMY MODIFIED RADICAL;  Surgeon: Fanny Skates, MD;  Location[facility]  OR;  Service: General;  Laterality: Right;  . MODIFIED RADICAL MASTECTOMY Right 11/05/2016  . placement   of port-a-cath    . PLEURAL BIOPSY Right 05/30/2018   Procedure: PLEURAL BIOPSY;  Surgeon: Grace Isaac, MD;  Location: [facility];  Service: Thoracic;  Laterality: Right;  . PLEURAL EFFUSION DRAINAGE Right 05/30/2018   Procedure: DRAINAGE OF PLEURAL EFFUSION;  Surgeon: Grace Isaac, MD;  Location: [facility];  Service: Thoracic;  Laterality: Right;  . PORT-A-CATH REMOVAL  2006   insertion 2005  . PORT-A-CATH REMOVAL N/A 03/29/2017   Procedure: REMOVAL PORT-A-CATH;  Surgeon: Fanny Skates, MD;  Location: [facility];  Service: General;  Laterality: N/A;  . PORTACATH PLACEMENT Right 11/05/2016   Procedure: INSERTION PORT-A-CATH WITH  ULTRA SOUND;  Surgeon: Fanny Skates, MD;  Location: [facility];  Service: General;  Laterality: Right;  . removal of port a cath  12/2014  . TALC PLEURODESIS Right 05/30/2018   Procedure: Pietro Cassis;  Surgeon: Grace Isaac, MD;  Location: [place] [facility];  Service: Thoracic;  Laterality: Right;  . THYROID SURGERY  2009   Ablation   . TOTAL KNEE ARTHROPLASTY Left 02/06/2016   Procedure: TOTAL KNEE ARTHROPLASTY;  Surgeon: Frederik Pear, MD;  Location: [facility];  Service: Orthopedics;  Laterality: Left;  . TUBAL LIGATION    . VIDEO ASSISTED THORACOSCOPY Right 05/30/2018   Procedure: VIDEO ASSISTED THORACOSCOPY;  Surgeon: Grace Isaac, MD;  Location: [facility] OR;  Service: Thoracic;  Laterality: Right;     Social History   reports that she has never smoked. She has never used smokeless tobacco. She reports that she does not drink alcohol or use drugs.   Family History   Her family history includes Arthritis in her father; COPD in her sister; Cancer in her cousin; Diabetes in her mother and sister; Heart attack in her brother; Heart disease in her father; Heart failure in her father; Kidney disease in her mother; Liver disease in her sister; Lung cancer in her paternal uncle; Non-Hodgkin's lymphoma (age of onset: 78) in her cousin; Sarcoidosis in her sister; Stroke in her brother, mother, and sister.   Allergies Allergies  Allergen Reactions  . Lisinopril Anaphylaxis, Swelling and Other (See Comments)    Swelling of tongue and mouth 11/05/16- tolerates Olmesartan  . Pepcid [Famotidine] Other (See Comments)    PPI H2, BLOCKERS LOWER GASTRIC PH WHICH WOULD LEAD TO SUBTHERAPEUTIC RILPIVIRINE LEVELS AND POTENTIAL VIROLOGICAL FAILURE WITH RESISTANCE  . Prilosec [Omeprazole] Other (See Comments)    PPI H2, BLOCKERS LOWER GASTRIC PH WHICH WOULD LEAD TO SUBTHERAPEUTIC RILPIVIRINE LEVELS AND POTENTIAL VIROLOGICAL FAILURE WITH RESISTANCE  . Tums [Calcium Carbonate Antacid] Other (See Comments)    TUMS ANTACIDS  CAN LOWER GASTRIC PH WHICH COULD  LEAD TO SUBTHERAPEUTIC RILPIVIRINE LEVELS AND POTENTIAL VIROLOGICAL FAILURE WITH RESISTANCE TUMS CAN BE GIVEN BUT NEED CONSULT WITH ID PHARMACY RE TIMING. I PREFER HER TO AVOID ALL TOGETHER     Home Medications  Prior to Admission medications   Medication Sig Start Date End Date Taking? Authorizing Provider  ELDERBERRY PO Take 2 tablets by mouth daily. CHEWABLE GUMMIES   Yes [provider]  HYDROcodone-acetaminophen (NORCO/VICODIN) 5-325 MG tablet TAKE ONE TABLET BY MOUTH 4 TIMES A DAY AS INSTRUCTED Patient taking differently: Take 1 tablet by mouth at bedtime as needed (for discomfort/sleep).  05/01/19  Yes Magrinat, Virgie Dad, MD  JULUCA 50-25 MG TABS TAKE 1 TABLET BY MOUTH DAILY WITH BREAKFAST. TAKE WITH THE PREZCOBIX. Patient taking differently:  Take 1 tablet by mouth at bedtime. Take with Prezcobix 04/16/19  Yes Tommy Medal, Lavell Islam, MD  levothyroxine (SYNTHROID) 200 MCG tablet Take 1 tablet (200 mcg total) by mouth daily before breakfast. 02/18/19  Yes Renato Shin, MD  lidocaine-prilocaine (EMLA) cream APPLY 1 APPLICATION TOPICALLY AS NEEDED. APPLY TO PORT SITE 1 HOUR PRIOR TO ACCESS Patient taking differently: Apply 1 application topically as needed (to port site 1 hour prior to access).  04/24/19  Yes Magrinat, Virgie Dad, MD  Loratadine 10 MG CAPS Take 1 capsule by mouth as needed (Allergies).    Yes [provider]  Menthol, Topical Analgesic, (BENGAY EX) Apply 1 application topically as needed (for muscle or knee pain).    Yes [provider]  methocarbamol (ROBAXIN) 500 MG tablet Take 1 tablet (500 mg total) by mouth every 8 (eight) hours as needed for muscle spasms. Patient taking differently: Take 500 mg by mouth at bedtime as needed for muscle spasms.  12/08/18  Yes Magrinat, Virgie Dad, MD  Multiple Vitamins-Minerals (ONE-A-DAY WOMENS 50 PLUS) TABS Take 1 tablet by mouth daily with breakfast.   Yes [provider]   naproxen sodium (ALEVE) 220 MG tablet Take 220-440 mg by mouth 2 (two) times daily as needed (for muscle aches).   Yes [provider]  Nutritional Supplements (EQ NUTRITIONAL SHAKE) LIQD Take 237 mLs by mouth 3 (three) times daily.   Yes [provider]  nystatin (MYCOSTATIN/NYSTOP) powder Apply topically 3 (three) times daily. To affected areas Patient taking differently: Apply 1 g topically See admin instructions. Apply to affected, "raw" areas three times a day 05/31/19  Yes Donne Hazel, MD  olmesartan-hydrochlorothiazide (BENICAR HCT) 40-25 MG tablet Take 1 tablet by mouth daily.   Yes [provider]  PREZCOBIX 800-150 MG tablet TAKE 1 TABLET BY MOUTH DAILY. SWALLOW WHOLE. DO NOT CRUSH, BREAK OR CHEW TABLETS. TAKE WITH FOOD. Patient taking differently: Take 1 tablet by mouth at bedtime.  04/16/19  Yes Tommy Medal, Lavell Islam, MD  Propylene Glycol (SYSTANE BALANCE OP) Place 1 drop into both eyes 4 (four) times daily as needed (to relieve burning, irritation, and discomfort caused by dry eyes).    Yes [provider]  SELZENTRY 150 MG tablet TAKE ONE TABLET BY MOUTH TWICE DAILY Patient taking differently: Take 150 mg by mouth See admin instructions. Take 150 mg by mouth at lunchtime and 150 mg at bedtime 01/02/19  Yes Tommy Medal, Lavell Islam, MD  dexamethasone (DECADRON) 2 MG tablet Taper dose: 33m po daily x 3 days, then 133mpo daily x 3 days, then stop. Zero refills 05/31/19   ChDonne HazelMD  feeding supplement, ENSURE ENLIVE, (ENSURE ENLIVE) LIQD Take 237 mLs by mouth 3 (three) times daily between meals. Patient not taking: Reported on 06/02/2019 05/01/19   Ka[place]ford ShiMD     Critical care time:      PaGeorgann HousekeeperAGACNP-BC [place] personal pager PCCM on call pager ([phone]/18/2020 12:35 PM

## 2019-06-12 NOTE — Progress Notes (Signed)
M.D made aware of patient's discomfort breathing.

## 2019-06-12 NOTE — Progress Notes (Signed)
Echocardiogram 2D Echocardiogram has been performed.  Oneal Deputy Aidin Doane 06/12/2019, 2:25 PM

## 2019-06-12 NOTE — Progress Notes (Signed)
Pharmacy Antibiotic Note  Kayla Price is a 63 y.o. female admitted on 06/01/2019 with weakness and worsening ulcer.  Pt was started on Rocephin + Doxy for PNA.  Today is abx d#4 and pt continues to have significant SOB.  Pharmacy has been consulted for broaden abx coverage to Vancomycin and Cefepime.    SCr has been ranging 1-1.2.  Plan: Vancomycin 1750mg  IV x 1, followed by Vancomycin 1250 mg IV Q 24 hrs.  Goal AUC 400-550. Expected AUC: 457 SCr used: 1.21 Vd coefficient 0.5  Cefepime 2gm IV q12h   Height: 5\' 7"  (170.2 cm) Weight: 198 lb 14.4 oz (90.2 kg) IBW/kg (Calculated) : 61.6  Temp (24hrs), Avg:98.8 F (37.1 C), Min:98.4 F (36.9 C), Max:98.9 F (37.2 C)  Recent Labs  Lab 06/06/19 0744 06/08/19 2104 06/09/19 0423 06/09/19 0742 06/10/19 0359 06/11/19 0422 06/12/19 0455  WBC 3.2* 3.9* 4.1  --  5.9 5.9 6.5  CREATININE 1.21*  --   --  1.09* 1.03*  --  1.21*    Estimated Creatinine Clearance: 54.8 mL/min (A) (by C-G formula based on SCr of 1.21 mg/dL (H)).    Allergies  Allergen Reactions  . Lisinopril Anaphylaxis, Swelling and Other (See Comments)    Swelling of tongue and mouth 11/05/16- tolerates Olmesartan  . Pepcid [Famotidine] Other (See Comments)    PPI H2, BLOCKERS LOWER GASTRIC PH WHICH WOULD LEAD TO SUBTHERAPEUTIC RILPIVIRINE LEVELS AND POTENTIAL VIROLOGICAL FAILURE WITH RESISTANCE  . Prilosec [Omeprazole] Other (See Comments)    PPI H2, BLOCKERS LOWER GASTRIC PH WHICH WOULD LEAD TO SUBTHERAPEUTIC RILPIVIRINE LEVELS AND POTENTIAL VIROLOGICAL FAILURE WITH RESISTANCE  . Tums [Calcium Carbonate Antacid] Other (See Comments)    TUMS ANTACIDS CAN LOWER GASTRIC PH WHICH COULD  LEAD TO SUBTHERAPEUTIC RILPIVIRINE LEVELS AND POTENTIAL VIROLOGICAL FAILURE WITH RESISTANCE TUMS CAN BE GIVEN BUT NEED CONSULT WITH ID PHARMACY RE TIMING. I PREFER HER TO AVOID ALL TOGETHER    Antimicrobials this admission: Rocephin 12/15 >> 12/18 Doxy 12/15 >> 12/18 Valacyclovir  12/11 >> 12/18 Vancomycin 12/18 >> Cefepime 12/18 >>  Dose adjustments this admission:   Microbiology results: 12/15 BCx >> ngtd   Thank you for allowing pharmacy to be a part of this patient's care.  Elmer Ramp 06/12/2019 2:57 PM

## 2019-06-12 NOTE — Progress Notes (Addendum)
HEMATOLOGY-ONCOLOGY PROGRESS NOTE  SUBJECTIVE: Kayla Price developed worsening shortness of breath earlier today. Seen by PCCM. Has order in place to transfer to progressive care unit. Awaiting bed.  The patient is able to wake up and open her eyes and talk to me.  She quickly falls back to sleep.  She reports shortness of breath without chest pain.  No fevers or chills.  Oncology History  Malignant neoplasm of upper-outer quadrant of right breast in female, estrogen receptor negative ([place])  03/2004 Surgery   Right lumpectomy and SLNB for IDC, 0.5cm, 1/2 + SLN, grade 3, ER-, PR-, HER-2 -.  Treated with Doxorubicin and Cyclophosphamide x 4, followed by weekly Paclitaxel x 7 and adjuvant radiation.    10/01/2016 Initial Biopsy   Right breast upper outer quadrant biopsy: IDC, DCIS, triple negative, grade 3. Lymph node: Positive   11/05/2016 Surgery   Right modified radical mastectomy: IDC, grade 3, 2.6 cm, +LVI, triple negative, margins negative, 2/10 LN + metastases.  T2, N1a   11/20/2016 - 03/11/2017 Adjuvant Chemotherapy   Gemcitabine/Carboplatin given on days 1 and 8 on a 21 day cycle x 6 cycles   07/15/2018 - 07/15/2018 Chemotherapy   The patient had pembrolizumab (KEYTRUDA) 200 mg in sodium chloride 0.9 % 50 mL chemo infusion, 200 mg, Intravenous, Once, 0 of 6 cycles  for chemotherapy treatment.    07/15/2018 - 01/01/2019 Chemotherapy   The patient had atezolizumab (TECENTRIQ) 840 mg in sodium chloride 0.9 % 250 mL chemo infusion, 840 mg, Intravenous, Once, 6 of 8 cycles Dose modification: 1,680 mg (original dose 840 mg, Cycle 4, Reason: Provider Judgment), 840 mg (original dose 840 mg, Cycle 4, Reason: Provider Judgment) Administration: 840 mg (07/15/2018), 840 mg (08/01/2018), 840 mg (08/15/2018), 840 mg (08/29/2018), 840 mg (09/12/2018), 840 mg (09/26/2018), 840 mg (10/10/2018), 840 mg (10/24/2018), 1,680 mg (11/07/2018), 1,680 mg (12/05/2018)  for chemotherapy treatment.    12/19/2018 -  Chemotherapy   The  patient had pegfilgrastim (NEULASTA) injection 6 mg, 6 mg, Subcutaneous, Once, 1 of 1 cycle Administration: 6 mg (03/28/2019) pegfilgrastim (NEULASTA ONPRO KIT) injection 6 mg, 6 mg, Subcutaneous, Once, 4 of 8 cycles Administration: 6 mg (01/09/2019), 6 mg (01/23/2019), 6 mg (02/06/2019), 6 mg (02/20/2019), 6 mg (04/03/2019), 6 mg (04/17/2019) eriBULin mesylate (HALAVEN) 2.75 mg in sodium chloride 0.9 % 100 mL chemo infusion, 1.2 mg/m2 = 2.75 mg (100 % of original dose 1.2 mg/m2), Intravenous,  Once, 5 of 9 cycles Dose modification: 1.2 mg/m2 (original dose 1.2 mg/m2, Cycle 1, Reason: Provider Judgment) Administration: 2.75 mg (12/19/2018), 2.75 mg (01/09/2019), 2.75 mg (01/23/2019), 2.75 mg (02/06/2019), 2.75 mg (02/20/2019), 2.75 mg (03/06/2019), 2.75 mg (03/25/2019), 2.75 mg (04/03/2019), 2.75 mg (04/17/2019)  for chemotherapy treatment.    01/02/2019 - 01/02/2019 Chemotherapy   The patient had atezolizumab (TECENTRIQ) 1,200 mg in sodium chloride 0.9 % 250 mL chemo infusion, 1,200 mg, Intravenous, Once, 0 of 6 cycles  for chemotherapy treatment.    Metastatic breast cancer ([place])  11/05/2016 Initial Diagnosis   Recurrent cancer of right breast ([place])   07/15/2018 - 01/01/2019 Chemotherapy   The patient had atezolizumab (TECENTRIQ) 840 mg in sodium chloride 0.9 % 250 mL chemo infusion, 840 mg, Intravenous, Once, 5 of 8 cycles Dose modification: 1,680 mg (original dose 840 mg, Cycle 4, Reason: Provider Judgment), 840 mg (original dose 840 mg, Cycle 4, Reason: Provider Judgment) Administration: 840 mg (07/15/2018), 840 mg (08/01/2018), 840 mg (08/15/2018), 840 mg (08/29/2018), 840 mg (09/12/2018), 840 mg (09/26/2018), 840 mg (10/10/2018), 840  mg (10/24/2018), 1,680 mg (11/07/2018)  for chemotherapy treatment.    12/19/2018 -  Chemotherapy   The patient had pegfilgrastim (NEULASTA) injection 6 mg, 6 mg, Subcutaneous, Once, 1 of 1 cycle Administration: 6 mg (03/28/2019) pegfilgrastim (NEULASTA ONPRO KIT) injection 6 mg, 6 mg,  Subcutaneous, Once, 4 of 8 cycles Administration: 6 mg (01/09/2019), 6 mg (01/23/2019), 6 mg (02/06/2019), 6 mg (02/20/2019), 6 mg (04/03/2019), 6 mg (04/17/2019) eriBULin mesylate (HALAVEN) 2.75 mg in sodium chloride 0.9 % 100 mL chemo infusion, 1.2 mg/m2 = 2.75 mg (100 % of original dose 1.2 mg/m2), Intravenous,  Once, 5 of 9 cycles Dose modification: 1.2 mg/m2 (original dose 1.2 mg/m2, Cycle 1, Reason: Provider Judgment) Administration: 2.75 mg (12/19/2018), 2.75 mg (01/09/2019), 2.75 mg (01/23/2019), 2.75 mg (02/06/2019), 2.75 mg (02/20/2019), 2.75 mg (03/06/2019), 2.75 mg (03/25/2019), 2.75 mg (04/03/2019), 2.75 mg (04/17/2019)  for chemotherapy treatment.      PHYSICAL EXAMINATION: ECOG PERFORMANCE STATUS: 2 - Symptomatic, <50% confined to bed  Vitals:   06/12/19 0846 06/12/19 0854  BP: 128/76 128/76  Pulse: 98 95  Resp: (!) 24 (!) 24  Temp: 98.4 F (36.9 C)   SpO2: 92% 97%   Filed Weights   06/07/19 0500 06/08/19 0500 06/10/19 2026  Weight: 198 lb 13.7 oz (90.2 kg) 198 lb 13.7 oz (90.2 kg) 198 lb 14.4 oz (90.2 kg)    Intake/Output from previous day: 12/17 0701 - 12/18 0700 In: 1498.8 [P.O.:560; I.V.:114.8; IV Piggyback:350] Out: 1875 [Urine:1875]  GENERAL:alert, appears tachypneic LUNGS: Coarse crackles, left greater than right, diminished on the right. HEART: regular rate & rhythm and no murmurs and bilateral lower extremity edema, right greater than left ABDOMEN:abdomen soft, non-tender and normal bowel sounds NEURO: Alert, oriented, nonfocal  LABORATORY DATA:  I have reviewed the data as listed CMP Latest Ref Rng & Units 06/12/2019 06/10/2019 06/09/2019  Glucose 70 - 99 mg/dL 96 110(H) 105(H)  BUN 8 - 23 mg/dL _0 Creatinine 0.44 - 1.00 mg/dL 1.21(H) 1.03(H) 1.09(H)  Sodium 135 - 145 mmol/L 137 135 135  Potassium 3.5 - 5.1 mmol/L 3.5 3.7 4.0  Chloride 98 - 111 mmol/L 102 100 101  CO2 22 - 32 mmol/L _1 Calcium 8.9 - 10.3 mg/dL 8.4(L) 8.5(L) 8.6(L)  Total  Protein 6.5 - 8.1 g/dL 5.3(L) - -  Total Bilirubin 0.3 - 1.2 mg/dL 0.4 - -  Alkaline Phos 38 - 126 U/L 54 - -  AST 15 - 41 U/L 28 - -  ALT 0 - 44 U/L 22 - -    Lab Results  Component Value Date   WBC 6.5 06/12/2019   HGB 8.1 (L) 06/12/2019   HCT 25.5 (L) 06/12/2019   MCV 97.3 06/12/2019   PLT 233 06/12/2019   NEUTROABS 1.8 06/01/2019    CT ANGIO CHEST PE W OR WO CONTRAST  Result Date: 06/09/2019 CLINICAL DATA:  Shortness of breath.  Metastatic breast cancer. EXAM: CT ANGIOGRAPHY CHEST WITH CONTRAST TECHNIQUE: Multidetector CT imaging of the chest was performed using the standard protocol during bolus administration of intravenous contrast. Multiplanar CT image reconstructions and MIPs were obtained to evaluate the vascular anatomy. CONTRAST:  60m OMNIPAQUE IOHEXOL 350 MG/ML SOLN COMPARISON:  PET CT 03/11/2019.  Chest CT 04/22/2018 FINDINGS: Cardiovascular: No filling defects in the pulmonary arteries to suggest pulmonary emboli. Tortuous, ectatic aorta with maximum diameter 3.7 cm. No dissection. Heart is enlarged. Mediastinum/Nodes: Extensive adenopathy in the mediastinum. Surgical clips in the right axilla. This is unchanged  since recent PET CT. Lungs/Pleura: Extensive pleural nodularity/disease throughout right hemithorax, similar to recent PET CT. Right middle lobe mass is unchanged. Central necrotic portion again noted, unchanged. Scattered ground-glass airspace disease noted within both lungs concerning for pneumonia. Upper Abdomen: Gastrohepatic ligament lymph nodes again noted, unchanged. No acute findings. Musculoskeletal: Prior right mastectomy. Right chest wall Port-A-Cath remains in place, unchanged. Sclerotic lesion within the T11 vertebral body is stable. Review of the MIP images confirms the above findings. IMPRESSION: No evidence of pulmonary embolus. Extensive mediastinal adenopathy, right pleural metastatic disease, and right middle lobe mass are all stable since prior PET CT.  Worsening ground-glass airspace disease throughout both lungs concerning for pneumonia. Cardiomegaly. Tortuous, ectatic thoracic aorta. Electronically Signed   By: Rolm Baptise M.D.   On: 06/09/2019 17:44   US RENAL  Result Date: 05/28/2019 CLINICAL DATA:  Acute kidney injury. EXAM: RENAL / URINARY TRACT ULTRASOUND COMPLETE COMPARISON:  PET CT scan 03/11/2019. FINDINGS: Right Kidney: Renal measurements: 9.9 x 4.3 x 5.0 cm = volume: 109.7 mL . Echogenicity within normal limits. No mass or hydronephrosis visualized. Left Kidney: Renal measurements: 9.6 x 5.4 x 4.9 cm = volume: Is 134.3. mL. Echogenicity within normal limits. No mass or hydronephrosis visualized. Bladder: Appears normal for degree of bladder distention. Other: Small right pleural effusion and gallstones are noted as seen on the prior exam. IMPRESSION: Negative for hydronephrosis.  Normal appearing kidneys. Right pleural effusion. Gallstones. Electronically Signed   By: Inge Rise M.D.   On: 05/28/2019 07:15   DG Chest Port 1 View  Result Date: 06/12/2019 CLINICAL DATA:  Hypoxia. Metastatic breast cancer. EXAM: PORTABLE CHEST 1 VIEW COMPARISON:  CTA chest 06/09/2019. One-view chest x-ray 06/08/2019. FINDINGS: The heart size is normal. The heart is enlarged. Right IJ Port-A-Cath is stable. A right pleural effusion has slightly increased. Right greater than left airspace opacities have progressed. Surgical clips are noted in the right axilla. IMPRESSION: 1. Progressive right greater than left airspace disease compatible with pneumonia. 2. Slight increase in right pleural effusion. 3. Stable right IJ Port-A-Cath. Electronically Signed   By: San Morelle M.D.   On: 06/12/2019 09:29   DG CHEST PORT 1 VIEW  Result Date: 06/08/2019 CLINICAL DATA:  Hypoxia EXAM: PORTABLE CHEST 1 VIEW COMPARISON:  07/10/2018 FINDINGS: Right Port-A-Cath in place with the tip in the SVC. Airspace disease in the right lower lung concerning for pneumonia.  No confluent opacity on the left. Heart is borderline in size. Possible small right effusion. No acute bony abnormality. IMPRESSION: Right lower lung airspace disease concerning for pneumonia. Small right effusion suspected. Electronically Signed   By: Rolm Baptise M.D.   On: 06/08/2019 18:55   VAS [place] LOWER EXTREMITY VENOUS (DVT)  Result Date: 06/09/2019  Lower Venous Study Indications: Swelling.  Risk Factors: Cancer History of metastatic breast cancer. Comparison Study: No prior study on file Performing Technologist: Sharion Dove RVS  Examination Guidelines: A complete evaluation includes B-mode imaging, spectral Doppler, color Doppler, and power Doppler as needed of all accessible portions of each vessel. Bilateral testing is considered an integral part of a complete examination. Limited examinations for reoccurring indications may be performed as noted.  +---------+---------------+---------+-----------+----------+--------------+ RIGHT    CompressibilityPhasicitySpontaneityPropertiesThrombus Aging +---------+---------------+---------+-----------+----------+--------------+ CFV      Partial        Yes      Yes                  Acute          +---------+---------------+---------+-----------+----------+--------------+  SFJ      Full                                                        +---------+---------------+---------+-----------+----------+--------------+ FV Prox  None                                         Acute          +---------+---------------+---------+-----------+----------+--------------+ FV Mid   None                                         Acute          +---------+---------------+---------+-----------+----------+--------------+ FV DistalNone                                         Acute          +---------+---------------+---------+-----------+----------+--------------+ PFV      Full                                                         +---------+---------------+---------+-----------+----------+--------------+ POP      None           No       No                   Acute          +---------+---------------+---------+-----------+----------+--------------+ PTV      None                                         Acute          +---------+---------------+---------+-----------+----------+--------------+ PERO     None                                         Acute          +---------+---------------+---------+-----------+----------+--------------+ EIV                     Yes      Yes                                 +---------+---------------+---------+-----------+----------+--------------+   +----+---------------+---------+-----------+----------+--------------+ LEFTCompressibilityPhasicitySpontaneityPropertiesThrombus Aging +----+---------------+---------+-----------+----------+--------------+ CFV Full           Yes      Yes                                 +----+---------------+---------+-----------+----------+--------------+     Summary: Right: Findings consistent with acute deep vein thrombosis involving the right common femoral vein, right femoral  vein, right popliteal vein, right posterior tibial veins, and right peroneal veins. Left: No evidence of common femoral vein obstruction.  *See table(s) above for measurements and observations. Electronically signed by Harold Barban MD on 06/09/2019 at 8:27:06 AM.    Final     ASSESSMENT: 63 y.o. [place] woman    (1) status post right lumpectomy and sentinel lymph node sampling October 2005 for a 0.6 cm invasive ductal carcinoma involving one out of 2 sentinel lymph nodes sampled, grade 3, triple-negative, treated adjuvantly with doxorubicin and cyclophosphamide 4 followed by weekly paclitaxel 7, followed by adjuvant radiation   RECURRENT DISEASE: (2) status post right breast upper outer quadrant biopsy and right axillary lymph node biopsy 10/01/2016, both  positive for a T2 N1, stage IIIB invasive ductal carcinoma, triple negative, with an MIB-1 of 50-70%   (3) status post right modified radical mastectomy 11/05/2016 showing a pT2 pN1, stage IIIB invasive ductal carcinoma, grade 3, triple negative, with negative margins   (4) not a candidate for radiation given prior history   (5) adjuvant chemotherapy consisting of carboplatin and gemcitabine given days 1 and 8 of each 21 day cycle, for 6 cycles, starting 11/20/2016, completed 03/11/2017             (a) day 8 cycle 2 omitted because of neutropenia; Neupogen/Neulasta added   (5) HIV positivity: under care of [facility] [place])   (6) Genetic testing 06/17/2017:  no pathogenic mutations. Genes tested: APC, ATM, AXIN2, BARD1, BLM, BMPR1A, BRCA1, BRCA2, BRIP1, CDH1, CDK4, CDKN2A (p14ARF), CDKN2A (p16INK4a), CEBPA, CHEK2, CTNNA1, DICER1, EPCAM*, GATA2, GREM1*, HRAS, KIT, MEN1, MLH1, MSH2, MSH3, MSH6, MUTYH, NBN, NF1, PALB2, PDGFRA, PMS2, POLD1, POLE, PTEN, RAD50, RAD51C, RAD51D, RUNX1, SDHB, SDHC, SDHD, SMAD4, SMARCA4, STK11, TERC, TERT, TP53, TSC1, TSC2, VHL. The following genes were evaluated for sequence changes only: HOXB13*, NTHL1*, SDHA.              (a) A variant of uncertain significance (VUS) in a gene called NTHL1 was also noted. c.736G>A (p.[id number])   METASTATIC DISEASE: November 2019 (1) Patient seen in urgent care and ultimately ED on 04/14/2018 for shortness of breath, chest xray demonstrated Right pleural effusion.   (a) right thoracenteses on 10/21 and 11/4 results show atypical cells, non diagnostic (b) CT chest 04/22/2018 shows re-accumulation of fluid, and right pleural nodularity. (c) PET scan on 05/01/2018 shows hypermetabolic pleural based metastases, right CP angle nodal metastases, no evidence of malignancy in abdomen and pelvis. (d) bronchoscopy with biopsy by Dr. Lamonte Sakai and BAL on 05/15/2018 was non diagnostic             (e) VAT biopsy of the right pleura 05/30/2018 confirms  carcinoma, triple negative             (f) [facility] shows PD-L1 positive (1% in [facility]); otherwise microsatellite stable, TMB low (5/Mb), no PIK3 mutations; other mutations suggest sensitivity to MTOR inhibitors and several TKIs   (2) Atezolizumab started 07/15/2018 given every other week             (a) PET 08/13/2018 shows tumor Right hemithorax (new baseline study)             (b) Atezo changed to Q4w starting with 11/07/2018 dose             (c) PET 11/28/2018 documents progression in the right lung and pleural area as well as lymph nodes, and a T1 skeletal metastasis             (  d) atezolizumab discontinued after 12/05/2018 dose   (3) eribulin day 1 and 8 of every 21 day cycle started 12/19/2018             (a) changed to every other week with Neulsta support due to neutropenia causing treatment delays and her h/o HIV (delays due to insurance denial of onpro after 02/20/2019 dose)             (b) PET scan on 03/11/2019 showed stable to improved measurable disease, with multiple new bone lesions             (c) zoledronate given every 12 weeks starting on 03/25/2019   (4) brain MRI 04/28/2019 documents multiple intracranial metastases             (a) whole brain radiation 04/30/2019 - 05/13/2019, 30 Gy in 10 fractions  (5) right lower extremity DVT diagnosed December 2020     PLAN:  Mikaiah has developed worsening shortness of breath today.  She has been seen by PCCM who recommends transfer to a progressive care unit and she is awaiting a bed.  They plan to broaden antibiotics, obtain echocardiogram, and use BiPAP as needed.  Breast cancer standpoint, she recently developed brain metastases and completed whole brain radiation in November 2020.  The development of brain metastases does indicate significant limitation in Lawrencia's prognosis.  Her last PET scan September 2020 showed a mixed response elsewhere in the body.  She last received her chemotherapy on 04/17/2019.  Her course of  chemotherapy was interrupted due to whole brain radiation and then she missed several appointments due to hospitalizations.  We are planning a restaging PET scan as an outpatient, but again this has not been performed due to her inpatient status.  Agree with trying to treat reversible causes for her acute respiratory failure.  If she continues to decline, would recommend consult from the palliative care team to discuss goals of care. Appreciate assistance from [facility] and hospitalist.   LOS: 9 days   Mikey Bussing, DNP, AGPCNP-BC, AOCNP 06/12/19   ADDENDUM: I visited Annalise in her room. She was very SOB with increased WOB--no cough, no phlem, no pleurisy. Note CT scan 12/15 showed bilateral ground-glass opacities (in addition to other findings). These could be lymphangitic spread from her cancer, although the appearance would not be typical.  I clarified her wishes and she is very clear she would not want intubation and mechanical ventilation. She is willing to give BiPap a try. If that plus other supportive measures prove inadequate she will likely require a morphine drip for comfort. I am requesting a consult from palliative care to assist with this transition and help the family as well.  I personally saw this patient and performed a substantive portion of this encounter with the listed APP documented above.   Will follow with you  Chauncey Cruel, MD Medical Oncology and Hematology [facility] [address] [place], [place] [address] Tel. [phone]    Fax. [phone]

## 2019-06-13 ENCOUNTER — Inpatient Hospital Stay (HOSPITAL_COMMUNITY): Payer: Medicare HMO

## 2019-06-13 DIAGNOSIS — Z7189 Other specified counseling: Secondary | ICD-10-CM

## 2019-06-13 DIAGNOSIS — N179 Acute kidney failure, unspecified: Secondary | ICD-10-CM

## 2019-06-13 LAB — BASIC METABOLIC PANEL
Anion gap: 8 (ref 5–15)
BUN: 13 mg/dL (ref 8–23)
CO2: 27 mmol/L (ref 22–32)
Calcium: 8.4 mg/dL — ABNORMAL LOW (ref 8.9–10.3)
Chloride: 104 mmol/L (ref 98–111)
Creatinine, Ser: 1.07 mg/dL — ABNORMAL HIGH (ref 0.44–1.00)
GFR calc Af Amer: >60 mL/min (ref >60)
GFR calc non Af Amer: 55 mL/min — ABNORMAL LOW (ref >60)
Glucose, Bld: 86 mg/dL (ref 70–99)
Potassium: 3.2 mmol/L — ABNORMAL LOW (ref 3.5–5.1)
Sodium: 139 mmol/L (ref 135–145)

## 2019-06-13 LAB — CBC
HCT: 27 % — ABNORMAL LOW (ref 36.0–46.0)
Hemoglobin: 8.4 g/dL — ABNORMAL LOW (ref 12.0–15.0)
MCH: 30.8 pg (ref 26.0–34.0)
MCHC: 31.1 g/dL (ref 30.0–36.0)
MCV: 98.9 fL (ref 80.0–100.0)
Platelets: 261 10*3/uL (ref 150–400)
RBC: 2.73 MIL/uL — ABNORMAL LOW (ref 3.87–5.11)
RDW: 20.4 % — ABNORMAL HIGH (ref 11.5–15.5)
WBC: 8.1 10*3/uL (ref 4.0–10.5)
nRBC: 1.7 % — ABNORMAL HIGH (ref 0.0–0.2)

## 2019-06-13 LAB — BRAIN NATRIURETIC PEPTIDE: B Natriuretic Peptide: 171.5 pg/mL — ABNORMAL HIGH (ref 0.0–100.0)

## 2019-06-13 LAB — MAGNESIUM: Magnesium: 2 mg/dL (ref 1.7–2.4)

## 2019-06-13 MED ORDER — FUROSEMIDE 10 MG/ML IJ SOLN
40.0000 mg | Freq: Once | INTRAMUSCULAR | Status: AC
  Administered 2019-06-13: 19:00:00 40 mg via INTRAVENOUS
  Filled 2019-06-13: qty 4

## 2019-06-13 MED ORDER — POTASSIUM CHLORIDE CRYS ER 20 MEQ PO TBCR: 40.0000 meq | EXTENDED_RELEASE_TABLET | Freq: Once | ORAL | Status: DC

## 2019-06-13 MED ORDER — POTASSIUM CHLORIDE 10 MEQ/100ML IV SOLN
10.0000 meq | INTRAVENOUS | Status: AC
  Administered 2019-06-13 (×4): 10 meq via INTRAVENOUS
  Filled 2019-06-13 (×4): qty 100

## 2019-06-13 MED ORDER — SODIUM CHLORIDE 0.9 % IV SOLN
2.0000 g | Freq: Two times a day (BID) | INTRAVENOUS | Status: DC
  Administered 2019-06-13: 2 g via INTRAVENOUS
  Filled 2019-06-13 (×2): qty 2

## 2019-06-13 NOTE — Consult Note (Signed)
Palliative care progress note  Reason for consult: Goals of care in light of metastatic cancer and respiratory failure  Palliative care consult received.  Chart reviewed including personal review of pertinent labs and imaging.  Discussed case with Dr. Algis Liming.  I met today with Kayla Price.  Briefly, she is a 63 year old female with past medical history of Breast cancer metastasis to the brain and right pleura status post whole brain radiation, HIV, CKD stage III and recent admission for superficial skin breakdown and ulceration who was admitted with HSV ulcerations and right lower extremity DVT who suffered complication of worsening acute hypoxic respiratory failure likely secondary to pneumonia versus lymphangitic spread.  She is now BiPAP dependent and desats whenever removing BiPAP.  Palliative consulted for goals of care.  I introduced palliative care as specialized medical care for people living with serious illness. It focuses on providing relief from the symptoms and stress of a serious illness. The goal is to improve quality of life for both the patient and the family.  [name] is awake, alert, and participates in conversation despite wearing BiPAP.  She reports that her family, particularly her daughter, is the most important thing to her.  She states the doctors have been doing a good job explaining things to her about her situation.  We discussed her clinical course this admission including the fact that she now appears to be BiPAP dependent.  She relates that she has DO NOT RESUSCITATE order, but other than that reports that she is not short of breath or in pain at this time would like to continue with her current therapies.  We talked about concern that she is going to continue to decline regardless of interventions, and she reports understanding this concern.  She requested that I call and speak with her daughter also.  I called and was able to reach her daughter.  Her daughter  reports that she remains disappointed with the fact that her mother was discharged and returned to the hospital so shortly after discharge.  She feels that she was discharged to her the last time it is also disappointed with some of the care that she received during her last hospitalization.  She reports in particular she feels that communication was not as good whenever she was on the sixth floor as it was when she was in other units.  She expressly requests updates on her mother's condition on a daily basis.  She and I discussed concern that her mother is now BiPAP dependent and is at a high risk of continued decompensation regardless of interventions moving forward.  She reports that she agrees with her mother's plan to continue with current interventions and is hopeful for some stabilization of function and that she will be able to receive more disease modifying therapy in the future.  -DNR -She would like to continue with current interventions and see how she does over the next 24 to 48 hours.  She reports understanding concern about the severity of her condition and that there is a high likelihood she will continue to decompensate. -Palliative to continue to follow daily.  Total time: 80 minutes Greater than 50%  of this time was spent counseling and coordinating care related to the above assessment and plan.  Micheline Rough, MD [place] Team [phone]

## 2019-06-13 NOTE — Plan of Care (Signed)
  Problem: Education: Goal: Knowledge of General Education information will improve Description: Including pain rating scale, medication(s)/side effects and non-pharmacologic comfort measures Outcome: Progressing   Problem: Clinical Measurements: Goal: Ability to maintain clinical measurements within normal limits will improve Outcome: Progressing Goal: Diagnostic test results will improve Outcome: Progressing   Problem: Nutrition: Goal: Adequate nutrition will be maintained Outcome: Progressing   Problem: Coping: Goal: Level of anxiety will decrease Outcome: Progressing   Problem: Elimination: Goal: Will not experience complications related to urinary retention Outcome: Progressing   Problem: Pain Managment: Goal: General experience of comfort will improve Outcome: Progressing   Problem: Safety: Goal: Ability to remain free from injury will improve Outcome: Progressing   Problem: Education: Goal: Knowledge of General Education information will improve Description: Including pain rating scale, medication(s)/side effects and non-pharmacologic comfort measures Outcome: Progressing   Problem: Clinical Measurements: Goal: Ability to maintain clinical measurements within normal limits will improve Outcome: Progressing Goal: Will remain free from infection Outcome: Progressing Goal: Diagnostic test results will improve Outcome: Progressing   Problem: Coping: Goal: Level of anxiety will decrease Outcome: Progressing   Problem: Pain Managment: Goal: General experience of comfort will improve Outcome: Progressing   Elesa Hacker, RN

## 2019-06-13 NOTE — Progress Notes (Signed)
Attempted to wean Bipap.  Pt stayed on 92-94% on O2 6L via [place] for about 30 minutes, then desat to 78%, so Bipap was reapplied.  Idolina Primer, RN

## 2019-06-13 NOTE — Progress Notes (Signed)
NAME:  Kayla Price, MRN:  [id number], DOB:  1955-11-18, LOS: 70 ADMISSION DATE:  06/01/2019, CONSULTATION DATE:  12/18 REFERRING MD:  Dr. Algis Liming, CHIEF COMPLAINT:  Dypsnea  Brief History   63 year old female with HIV and metastatic breast cancer admitted with AKI. Course complicated by dyspnea requiring supplemental O2. CT with diffuse infiltrates. PCCM consulted.   History of present illness   63 year old female with past medical history as below, which is significant for breast cancer with metastasis to the brain and right pleura on current chemotherapy and status post whole brain radiation, HIV, CKD, HSV, and recent admission for acute kidney injury and hypotension.  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  She was discharged on 12/6 and was readmitted 1 day later with complaints of worsening drainage from those lesions.  In the emergency department she was also noted to be hypotensive with worsening renal indices.  She was admitted to the hospitalist service.  Hospital course since that time has been complicated by respiratory distress and imaging was concerning for pneumonia.  She was started on empiric ceftriaxone and doxycycline for presumed community-acquired pneumonia.  She was also found to have extensive right-sided DVT on 12/14 and was treated with Lovenox.  CT angiogram was done to assess for pulmonary embolism in the setting of ongoing dyspnea with now known DVT.  No PE was found but she was discovered to have diffuse interstitial prominence with question of consolidation as well.  PCCM was consulted for further evaluation.   She reports dyspnea started about the time of her first admission on 12/4 and has been progressive since that time. Denies chest pain, productive cough, fevers, chills, PND, and swallowing issues. She endorses orthopnea and occasional feet swelling.   Past Medical History   has a past medical history of AKI  (acute kidney injury) ([place]) (06/02/2019), Alopecia areata (11/28/2009), Bell's palsy, Cancer ([place]) (2005), CKD (chronic kidney disease) stage 3, GFR 30-59 ml/min (06/08/2015), Dry eye syndrome, Family history of lung cancer, Family history of non-Hodgkin's lymphoma, Fasting hyperglycemia, Gestational diabetes, HIP FRACTURE, RIGHT (05/06/2008), History of kidney stones, HIV DISEASE (03/27/2006), HYPERLIPIDEMIA, MIXED (12/15/2007), HYPERTENSION (03/27/2006), HYPOTHYROIDISM, POST-RADIATION (06/28/2008), MENORRHAGIA, POSTMENOPAUSAL (02/03/2009), Osteoarthritis of left knee (06/08/2015), OSTEOARTHROSIS, LOCAL, SCND, UNSPC SITE (04/07/2007), Personal history of chemotherapy (11/2018), PVD (04/07/2007), Tinea capitis, TRIGGER FINGER (05/06/2008), and Unspecified vitamin D deficiency (08/06/2007).   Significant Hospital Events   12/3 >12/6 admit for AKI and perineal ulcers 12/7 admit for worsening ulcer drainage and hypotension. 12/14 developed dyspnea, CXR concerning for PNA, started on CTX, doxy 12/18 PCCM consulted for worsening hypoxia and abnormal CT.    Consults:  PCCM  Procedures:    Significant Diagnostic Tests:  CTA chest 12/15 > No evidence of pulmonary embolus. Extensive mediastinal adenopathy, right pleural metastatic disease, and right middle lobe mass are all stable since prior PET CT. Worsening ground-glass airspace disease throughout both lungs concerning for pneumonia.  12/18 Echo EF 65-70%, no LVH. No RWMA. LA normal. RA severely dilated. Mild MR. Mild TR. Tricuspid aortic valve, regurgitation not visualized.  No evidence of AV sclerosis or stenosis. Severe calcification of AV. Trivial PVR. Severe elevated pulmonary artery systolic pressure is 72 mmHg.  Micro Data:  COVID 12/7 negative Blood 12/15 >>NGTD Sputum 12/15 > HSV skin/wound  > Postiive  Antimicrobials:  Ceftriaxone 12/15 >12/18 Doxycycline 12/15 >12/18 Cefepime 12/18 > Vancomycin 12/18 >  Interim  history/subjective:  Immediately desats off BiPAP and  appears in distress even when off for short period of time. Code status changed to DNR/DNI.   Objective   Blood pressure 120/84, pulse (!) 101, temperature 98.1 F (36.7 C), temperature source Oral, resp. rate (!) 27, height 5\' 7"  (1.702 m), weight 90.2 kg, last menstrual period 11/06/2003, SpO2 (!) 88 %.    FiO2 (%):  [40 %] 40 %   Intake/Output Summary (Last 24 hours) at 06/13/2019 1343 Last data filed at 06/13/2019 0431 Gross per 24 hour  Intake 332.44 ml  Output --  Net 332.44 ml   Filed Weights   06/07/19 0500 06/08/19 0500 06/10/19 2026  Weight: 90.2 kg 90.2 kg 90.2 kg    Examination: General: Elderly, frail acutely and chronically ill appearing lady, staff changing her gown with bath and removed BiPAP briefly, in acute distress with same  HENT: Normocephalic, PERRL. Dry mucus membranes Neck: No JVD. Trachea midline. CV: RRR. S1S2. No MRG. +2 distal pulses.  Right chest Port-A-Cath. Lungs: BBS diminished at bases with poor excursion, fine crackles.  Tachypnea off BiPAP, immediately improves when same replaced  ABD: +BS x4. SNT/ND. No masses, guarding or rigidity GU: No Foley EXT: MAE well. No edema. Global atrophy  Skin: PWD. In tact. No rashes or lesions Neuro: A&Ox3. CN II-XII in tact. No focal deficits Psych: Anxious     Resolved Hospital Problem list     Assessment & Plan:   Acute hypoxemic respiratory failure: She is BiPAP dependent.  Status changed to DNR/DNI overnight.  Echo consistent with severe pulmonary hypertension.  No PFTs on file.  Significant bilateral airspace disease right greater than left with pleural effusion noted yesterday.  No chest x-ray today.  Immunocompromise in the setting of metastatic cancer and HIV status.  Of note QuantiFERON gold was negative on 03/06/2019.  Suspect she has multifactorial respiratory failure including related to severe pulmonary hypertension, complicated by pneumonia  which may be atypical, possibly worsening pleural effusion. -Recommendations: -Continue BiPAP and establish clear goals of care.  Previous notes and discussion is that patient wants DNR/DNI.  Oncology notes reviewed and would recommend transition to comfort measures if decompensates. -Agree that palliative care should see the patient eminently and discuss with her and her daughter.  At this time if the patient were to further decompensate requiring intubation mechanical ventilation this would likely be permanent and she would likely be unable to wean.  Also in the setting of severe pulmonary hypertension intubating the patient could be catastrophic.  She has been clear in her wishes that she does not want intubation mechanical ventilation or CPR.   -In the interim will obtain a chest x-ray.  If there is a worsening pleural effusion could consider therapeutic thoracentesis though if this is related to her cancer same would likely reaccumulate fairly quickly.  Will give trial of diuretic for symptom relief. Potassium was replaced this morning. -We will continue broad-spectrum antibiotics as you are doing. -Not much less to offer from PCCM standpoint.  -We will sign off but please do not hesitate to contact for further assistance if needed.    Best practice:  Diet: Per primary Pain/Anxiety/Delirium protocol (if indicated): Per primary VAP protocol (if indicated): NA DVT prophylaxis: Full dose lovenox for DVT GI prophylaxis: per primary Glucose control: per primary Mobility: BR Code Status: DNR/DNI Family Communication: Patient updated bedside Disposition: PCU   Labs and imaging from past 24 hours reviewed in EMR   PCCM sign off.  Please contact if we can be of further  assistance. Francine Graven, MSN, AGACNP  [facility]

## 2019-06-13 NOTE — Progress Notes (Signed)
Note reviewd.  Now DNR/DNI but on bipap which can provide comfort Pall consult called per note review  Ccm will sign off     SIGNATURE    Dr. Brand Males, M.D., F.C.C.P,  Pulmonary and Critical Care Medicine Staff Physician, [place] Director - Interstitial Lung Disease  Program  [facility] [place], [place], [address]  Pager: [phone], If no answer or between  15:00h - 7:00h: call [phone] Telephone: [phone]  3:19 PM 06/13/2019

## 2019-06-13 NOTE — Progress Notes (Addendum)
[facility]  PROGRESS NOTE  Kayla Price XBM:[id number] DOB: 08-06-1955 DOA: 06/01/2019 PCP: Lauree Chandler, NP Admit date - 06/01/2019   Admitting Physician Elwyn Reach, MD  Outpatient Primary MD for the patient is Lauree Chandler, NP  LOS - 10 Brief Narrative   Kayla Price is a 63 y.o. 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  In the ED findings notable for blood pressure 88/64, WBC 2.4, platelets 54, creatinine 1.75 (baseline 1.1), lactic acid within normal limits.  Patient was given IV fluids and admitted with working diagnosis of AKI, hypotension, and decubitus ulcerations of buttocks requiring aggressive wound care.  Hospital course complicated by superficial skin breakdown of groin/perineal area consistent with intertrigo/fungal infection with no signs of decubitus ulcers for which wound care saw and recommended appropriate dressings with nystatin powder/Desitin.  Given appearance of lesions for blue staining HSV PCR was sent, returned positive.  Patient was started on Valtrex on 12/11.  Patient started on empiric IV ceftriaxone doxycycline for presumed community-acquired pneumonia given chest x-ray findings, patient started on Lovenox for extensive right-sided DVT on 12/14.  CTA chest negative for pulmonary embolism.  Progressively worsening dyspnea and hypoxia since 12/17.  On 12/18, consulted PCCM who transferred patient to progressive care unit for BiPAP.  Oncology followed up on 12/18, noted poor prognosis and have requested palliative care consult and transitioning to morphine  drip for comfort if supportive measures fail.  Subjective   Patient seen this morning.  On BiPAP.  Asking if her BiPAP can be removed.  As per discussion with RN, brief attempts overnight to remove BiPAP caused her to desaturate and had to go back on it.  This morning again attempted to get her off BiPAP, lasted for approximately 30 minutes and then desaturated into the high 70s and had to be put back on BiPAP. NPO due to BiPAP.  As per RN, Flexi-Seal is irritating her groin wounds, hence removed and now incontinent and uncomfortable.  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  - Lesions look less raw and patient reports much better in terms of pain. HSV-2 PCR of skin returned +  --Valtrex 1 g TID PO x 7 days, completed course. --f/u with ID as outpt arranged -Keeping this area clean/dry is key, -Discontinued Foley catheter on 12/17 but has to be placed back 12/19 due to comfort. -Improving.  Right lower extremity DVT.  Unilateral edema in setting of patient with known metastatic cancer.  Venous duplex positive for DVT -Initial plan to start Xarelto, but has interactions with HIV medications, so started on lovenox -In the interim we will continue Lovenox - CTA chest negative for PE.  Acute hypoxic respiratory failure.    Initially treated for community-acquired pneumonia with IV ceftriaxone and doxycycline.  CTA chest was negative for PE but showed findings of metastatic cancer.   On 12/18, noted worsening dyspnea and hypoxia, requiring 4 L/min oxygen, tachypneic.  Repeat chest x-ray shows left lower lobe consolidation with effusion.  Consulted and discussed with PCCM.  DD: Pneumonia, pulmonary edema, lymphangitis carcinoma.  Patient transferred to progressive care unit on  12/18, started BiPAP and has been BiPAP dependent since then.  CODE STATUS was verified by her oncologist and confirmed to be DNR.  Antibiotics broadened to IV  cefepime and vancomycin.  TTE: LVEF 65-70%.  Severe pulmonary artery hypertension.  BNP normal.  Unable to wean off BiPAP.  I personally discussed in detail with Dr. Domingo Cocking, Palliative MD and requested early consultation.  AKI on CKD, with urinary retention, back to baseline.  Creatinine improving with Foley in place.  Likely acute exacerbation in setting of brief hypotension and continue use of Benicar/also discontinued on recent hospitalization.  Renal ultrasound  on12/3 was negative for hydronephrosis -Avoid nephrotoxins -Acute kidney injury resolved.  Hypotension Resolved.  Hypothyroidism, stable -Continue Synthroid  Physical deconditioning.   - Prior level of independence: Independent with assistive devices.   - Although therapy is recommended SNF, not sure if patient will recover and make it out through this hospitalization.  HIV, controlled.   - CD4 greater than 400 on 10/26 -Continue darunavir -cobicistat, dolutegravir -Discuss with ID if able to change medications to decrease interactions with potential anticoagulation  Recent E. coli UTI.   Treated  Pancytopenia, likely chemotherapy associated.  -Leukopenia and thrombocytopenia have resolved.  Anemia stable.  Metastatic breast cancer, metastases to the brain(04/2019) and right pleural space(03/2018)   Status post whole brain radiation (11/5-11/18/2020 -Followed by Dr. Jana Hakim, plan for outpatient PET scan to determine next step in treatment -Dr. Virgie Dad follow-up appreciated.  Hypokalemia -  Replace IV.  Magnesium 2.   Family Communication  : None at bedside. I discussed in detail with patient's daughter 12/19, updated care and answered questions.  Updated her regarding the critical nature of her mother's illness and overall poor prognosis.  Code Status :  DNR  Disposition Plan  : To be determined.  Medically unstable for discharge.  Consults  :  Dr. Mariah Milling, [place], PCCM 12/18.  PMT  Procedures  :  Foley catheter  DVT Prophylaxis  :   SCDs/Lovenox-full dose    Inpatient Medications Scheduled Meds:  Chlorhexidine Gluconate Cloth  6 each Topical Daily   darunavir-cobicistat  1 tablet Oral QHS   dolutegravir  50 mg Oral QHS   And   rilpivirine  25 mg Oral QHS   enoxaparin (LOVENOX) injection  90 mg Subcutaneous Q12H   feeding supplement (ENSURE ENLIVE)  237 mL Oral TID BM   levothyroxine  200 mcg Oral QAC breakfast   maraviroc  150 mg Oral BID   multivitamin with minerals  1 tablet Oral Daily   Continuous Infusions:  sodium chloride 10 mL/hr at 06/10/19 2141   ceFEPime (MAXIPIME) IV     vancomycin     PRN Meds:.sodium chloride, acetaminophen **OR** acetaminophen, calcium carbonate (dosed in mg elemental calcium), camphor-menthol **AND** hydrOXYzine, docusate sodium, HYDROcodone-acetaminophen, ondansetron **OR** ondansetron (ZOFRAN) IV, sodium chloride flush, sorbitol, zolpidem  Antibiotics  :   Anti-infectives (From admission, onward)   Start     Dose/Rate Route Frequency Ordered Stop   06/13/19 1800  vancomycin (VANCOREADY) IVPB 1250 mg/250 mL     1,250 mg 166.7 mL/hr over 90 Minutes Intravenous Every 24 hours 06/12/19 1506     06/13/19 1130  ceFEPIme (MAXIPIME) 2 g in sodium chloride 0.9 % 100 mL IVPB     2 g 200 mL/hr over 30 Minutes Intravenous Every 12 hours 06/13/19 1159     06/12/19 1800  vancomycin (VANCOREADY) IVPB 1750 mg/350 mL     1,750 mg 175 mL/hr over 120 Minutes Intravenous  Once 06/12/19 1506 06/12/19 1940   06/12/19 1600  ceFEPIme (MAXIPIME) 2 g in sodium chloride 0.9 % 100 mL IVPB  Status:  Discontinued     2 g 200 mL/hr over 30 Minutes Intravenous Every 12 hours 06/12/19 1506 06/13/19 1159   06/10/19 0600  cefTRIAXone (ROCEPHIN) 1 g in sodium chloride 0.9 % 100 mL IVPB  Status:  Discontinued     1 g 200 mL/hr over 30 Minutes Intravenous Every 24 hours 06/09/19 0732 06/12/19 1335   06/09/19 0800  doxycycline (VIBRAMYCIN) 100 mg in sodium  chloride 0.9 % 250 mL IVPB  Status:  Discontinued     100 mg 125 mL/hr over 120 Minutes Intravenous Every 12 hours 06/09/19 0735 06/12/19 1335   06/09/19 0745  cefTRIAXone (ROCEPHIN) 1 g in sodium chloride 0.9 % 100 mL IVPB     1 g 200 mL/hr over 30 Minutes Intravenous  Once 06/09/19 0738 06/09/19 0924   06/05/19 2200  valACYclovir (VALTREX) tablet 1,000 mg     1,000 mg Oral 2 times daily 06/05/19 1418 06/12/19 1101   06/05/19 1000  valACYclovir (VALTREX) tablet 1,000 mg  Status:  Discontinued     1,000 mg Oral 3 times daily 06/05/19 0834 06/05/19 1418   06/02/19 2200  darunavir-cobicistat (PREZCOBIX) 800-150 MG per tablet 1 tablet     1 tablet Oral Daily at bedtime 06/02/19 0950     06/02/19 2200  dolutegravir (TIVICAY) tablet 50 mg     50 mg Oral Daily at bedtime 06/02/19 1455     06/02/19 2200  rilpivirine (EDURANT) tablet 25 mg     25 mg Oral Daily at bedtime 06/02/19 1455     06/02/19 1700  dolutegravir (TIVICAY) tablet 50 mg  Status:  Discontinued     50 mg Oral Daily with supper 06/02/19 1453 06/02/19 1456   06/02/19 1700  rilpivirine (EDURANT) tablet 25 mg  Status:  Discontinued     25 mg Oral Daily with supper 06/02/19 1453 06/02/19 1456   06/02/19 1415  fluconazole (DIFLUCAN) IVPB 200 mg  Status:  Discontinued     200 mg 100 mL/hr over 60 Minutes Intravenous Every 24 hours 06/02/19 1402 06/05/19 0834   06/02/19 1100  maraviroc (SELZENTRY) tablet 150 mg     150 mg Oral 2 times daily 06/02/19 0950     06/02/19 1000  cefdinir (OMNICEF) capsule 300 mg     300 mg Oral 2 times daily 06/02/19 0950 06/04/19 2207   06/02/19 1000  fluconazole (DIFLUCAN) tablet 400 mg  Status:  Discontinued     400 mg Oral Daily 06/02/19 0950 06/02/19 1402       Objective   Vitals:   06/13/19 1250 06/13/19 1256 06/13/19 1258 06/13/19 1259  BP:      Pulse: 97 (!) 104 (!) 108 (!) 101  Resp: (!) 21 (!) 37 18 (!) 27  Temp:      TempSrc:      SpO2: (!) 88% (!) 77% (!) 85% (!) 88%  Weight:        Height:        SpO2: (!) 88 % O2 Flow Rate (L/min): 6 L/min FiO2 (%): 40 %  Wt Readings from Last 3 Encounters:  06/10/19 90.2 kg  05/31/19 90.7 kg  05/19/19 88 kg     Intake/Output Summary (Last 24 hours) at 06/13/2019 1426 Last data filed at 06/13/2019 0431 Gross per 24 hour  Intake 332.44 ml  Output --  Net 332.44 ml  Physical Exam:  General exam: Pleasant middle-aged female, lying propped up in bed, still tachypneic but not as bad as yesterday, seems to be comfortable on BiPAP. RS: Reduced breath sounds in the bases with occasional basal crackles.  Otherwise clear.  Mild tachypnea.  No accessory muscles active. CVS: S1 and S2 heard, RRR.  No JVD, murmurs or pedal edema.  Telemetry personally reviewed: Sinus rhythm.  Has right lower extremity edema pertaining to DVT. Abdominal exam: Nondistended, soft and nontender.  No organomegaly or masses appreciated. Skin: As examined on 12/16: Multiple ulcers in the groin/perivulval/upper thigh area which appear clean.  Reportedly improving compared to prior as per nursing and patient report. CNS: Alert and oriented x2.  No focal neurological deficits. Psychiatry: Flat affect.  Impaired judgment and insight. Extremities: Right lower extremity mildly diffusely swollen compared to left without any other acute findings.  Likely related to DVT.     I have personally reviewed the following:   Data Reviewed:  CBC Recent Labs  Lab 06/09/19 0423 06/10/19 0359 06/11/19 0422 06/12/19 0455 06/13/19 0432  WBC 4.1 5.9 5.9 6.5 8.1  HGB 7.9* 8.5* 8.1* 8.1* 8.4*  HCT 23.7* 26.1* 25.5* 25.5* 27.0*  PLT 123* 151 182 233 261  MCV 94.0 93.9 98.1 97.3 98.9  MCH 31.3 30.6 31.2 30.9 30.8  MCHC 33.3 32.6 31.8 31.8 31.1  RDW 19.3* 19.4* 19.6* 20.0* 20.4*    Chemistries  Recent Labs  Lab 06/09/19 0742 06/10/19 0359 06/12/19 0455 06/13/19 0432  NA 135 135 137 139  K 4.0 3.7 3.5 3.2*  CL 101 100 102 104  CO2 26 25 28 27   GLUCOSE  105* 110* 96 86  BUN 10 11 13 13   CREATININE 1.09* 1.03* 1.21* 1.07*  CALCIUM 8.6* 8.5* 8.4* 8.4*  MG  --   --   --  2.0  AST  --   --  28  --   ALT  --   --  22  --   ALKPHOS  --   --  54  --   BILITOT  --   --  0.4  --    ------------------------------------------------------------------------------------------------------------------ No results for input(s): CHOL, HDL, LDLCALC, TRIG, CHOLHDL, LDLDIRECT in the last 72 hours.  Lab Results  Component Value Date   HGBA1C 6.1 03/07/2019   - Micro Results Recent Results (from the past 240 hour(s))  Culture, blood (Routine X 2) w Reflex to ID Panel     Status: None (Preliminary result)   Collection Time: 06/09/19  7:50 AM   Specimen: BLOOD  Result Value Ref Range Status   Specimen Description BLOOD LEFT ANTECUBITAL  Final   Special Requests   Final    BOTTLES DRAWN AEROBIC ONLY Blood Culture results may not be optimal due to an inadequate volume of blood received in culture bottles   Culture   Final    NO GROWTH 4 DAYS Performed at [facility] [address] [place], [place] [address]    Report Status PENDING  Incomplete  Culture, blood (Routine X 2) w Reflex to ID Panel     Status: None (Preliminary result)   Collection Time: 06/09/19  7:55 AM   Specimen: BLOOD LEFT HAND  Result Value Ref Range Status   Specimen Description BLOOD LEFT HAND  Final   Special Requests   Final    BOTTLES DRAWN AEROBIC ONLY Blood Culture results may not be optimal due to an inadequate volume of blood received in culture bottles  Culture   Final    NO GROWTH 4 DAYS Performed at [facility], [place] [address], [place], [place] [address]    Report Status PENDING  Incomplete    Radiology Reports CT ANGIO CHEST PE W OR WO CONTRAST  Result Date: 06/09/2019 CLINICAL DATA:  Shortness of breath.  Metastatic breast cancer. EXAM: CT ANGIOGRAPHY CHEST WITH CONTRAST TECHNIQUE: Multidetector CT imaging of the chest was performed using the  standard protocol during bolus administration of intravenous contrast. Multiplanar CT image reconstructions and MIPs were obtained to evaluate the vascular anatomy. CONTRAST:  23mL OMNIPAQUE IOHEXOL 350 MG/ML SOLN COMPARISON:  PET CT 03/11/2019.  Chest CT 04/22/2018 FINDINGS: Cardiovascular: No filling defects in the pulmonary arteries to suggest pulmonary emboli. Tortuous, ectatic aorta with maximum diameter 3.7 cm. No dissection. Heart is enlarged. Mediastinum/Nodes: Extensive adenopathy in the mediastinum. Surgical clips in the right axilla. This is unchanged since recent PET CT. Lungs/Pleura: Extensive pleural nodularity/disease throughout right hemithorax, similar to recent PET CT. Right middle lobe mass is unchanged. Central necrotic portion again noted, unchanged. Scattered ground-glass airspace disease noted within both lungs concerning for pneumonia. Upper Abdomen: Gastrohepatic ligament lymph nodes again noted, unchanged. No acute findings. Musculoskeletal: Prior right mastectomy. Right chest wall Port-A-Cath remains in place, unchanged. Sclerotic lesion within the T11 vertebral body is stable. Review of the MIP images confirms the above findings. IMPRESSION: No evidence of pulmonary embolus. Extensive mediastinal adenopathy, right pleural metastatic disease, and right middle lobe mass are all stable since prior PET CT. Worsening ground-glass airspace disease throughout both lungs concerning for pneumonia. Cardiomegaly. Tortuous, ectatic thoracic aorta. Electronically Signed   By: Rolm Baptise M.D.   On: 06/09/2019 17:44   US RENAL  Result Date: 05/28/2019 CLINICAL DATA:  Acute kidney injury. EXAM: RENAL / URINARY TRACT ULTRASOUND COMPLETE COMPARISON:  PET CT scan 03/11/2019. FINDINGS: Right Kidney: Renal measurements: 9.9 x 4.3 x 5.0 cm = volume: 109.7 mL . Echogenicity within normal limits. No mass or hydronephrosis visualized. Left Kidney: Renal measurements: 9.6 x 5.4 x 4.9 cm = volume: Is 134.3.  mL. Echogenicity within normal limits. No mass or hydronephrosis visualized. Bladder: Appears normal for degree of bladder distention. Other: Small right pleural effusion and gallstones are noted as seen on the prior exam. IMPRESSION: Negative for hydronephrosis.  Normal appearing kidneys. Right pleural effusion. Gallstones. Electronically Signed   By: Inge Rise M.D.   On: 05/28/2019 07:15   DG Chest Port 1 View  Result Date: 06/12/2019 CLINICAL DATA:  Hypoxia. Metastatic breast cancer. EXAM: PORTABLE CHEST 1 VIEW COMPARISON:  CTA chest 06/09/2019. One-view chest x-ray 06/08/2019. FINDINGS: The heart size is normal. The heart is enlarged. Right IJ Port-A-Cath is stable. A right pleural effusion has slightly increased. Right greater than left airspace opacities have progressed. Surgical clips are noted in the right axilla. IMPRESSION: 1. Progressive right greater than left airspace disease compatible with pneumonia. 2. Slight increase in right pleural effusion. 3. Stable right IJ Port-A-Cath. Electronically Signed   By: San Morelle M.D.   On: 06/12/2019 09:29   DG CHEST PORT 1 VIEW  Result Date: 06/08/2019 CLINICAL DATA:  Hypoxia EXAM: PORTABLE CHEST 1 VIEW COMPARISON:  07/10/2018 FINDINGS: Right Port-A-Cath in place with the tip in the SVC. Airspace disease in the right lower lung concerning for pneumonia. No confluent opacity on the left. Heart is borderline in size. Possible small right effusion. No acute bony abnormality. IMPRESSION: Right lower lung airspace disease concerning for pneumonia. Small right effusion  suspected. Electronically Signed   By: Rolm Baptise M.D.   On: 06/08/2019 18:55   ECHOCARDIOGRAM COMPLETE  Result Date: 06/12/2019   ECHOCARDIOGRAM REPORT   Patient Name:   CARAN STORCK Date of Exam: 06/12/2019 Medical Rec #:  [id number]         Height:       67.0 in Accession #:    [id number]        Weight:       198.0 lb Date of Birth:  1956/01/11         BSA:           2.01 m Patient Age:    89 years          BP:           128/76 mmHg Patient Gender: F                 HR:           108 bpm. Exam Location:  Inpatient Procedure: 2D Echo, Color Doppler and Cardiac Doppler Indications:    R06.9 DOE  History:        Patient has no prior history of Echocardiogram examinations.                 Risk Factors:Hypertension and Dyslipidemia.  Sonographer:    Raquel Sarna Senior RDCS Referring Phys: [place]  1. Left ventricular ejection fraction, by visual estimation, is 65 to 70%. The left ventricle has normal function. There is no left ventricular hypertrophy.  2. The left ventricle has no regional wall motion abnormalities.  3. Global right ventricle has normal systolic function.The right ventricular size is mildly enlarged. No increase in right ventricular wall thickness.  4. Left atrial size was normal.  5. Right atrial size was severely dilated.  6. The mitral valve is normal in structure. Mild mitral valve regurgitation. No evidence of mitral stenosis.  7. The tricuspid valve is normal in structure. Tricuspid valve regurgitation is mild.  8. The aortic valve is tricuspid. Aortic valve regurgitation is not visualized. No evidence of aortic valve sclerosis or stenosis.  9. There is severe calcifcation of the aortic valve. 10. The pulmonic valve was normal in structure. Pulmonic valve regurgitation is trivial. 11. Severely elevated pulmonary artery systolic pressure at [id number]. 12. The inferior vena cava is normal in size with greater than 50% respiratory variability, suggesting right atrial pressure of 3 mmHg. 13. The interatrial septum appears to be lipomatous. FINDINGS  Left Ventricle: Left ventricular ejection fraction, by visual estimation, is 65 to 70%. The left ventricle has normal function. The left ventricle has no regional wall motion abnormalities. There is no left ventricular hypertrophy. Normal left atrial pressure. Right Ventricle: The right ventricular size  is mildly enlarged. No increase in right ventricular wall thickness. Global RV systolic function is has normal systolic function. The tricuspid regurgitant velocity is 4.00 m/s, and with an assumed right atrial  pressure of 8 mmHg, the estimated right ventricular systolic pressure is severely elevated at 72.0 mmHg. Left Atrium: Left atrial size was normal in size. Right Atrium: Right atrial size was severely dilated Pericardium: There is no evidence of pericardial effusion. Mitral Valve: The mitral valve is normal in structure. Mild mitral valve regurgitation. No evidence of mitral valve stenosis by observation. Tricuspid Valve: The tricuspid valve is normal in structure. Tricuspid valve regurgitation is mild. Aortic Valve: The aortic valve is tricuspid. . There is moderate thickening and severe calcifcation  of the aortic valve. Aortic valve regurgitation is not visualized. The aortic valve is structurally normal, with no evidence of sclerosis or stenosis. There is moderate thickening of the aortic valve. There is severe calcifcation of the aortic valve. Pulmonic Valve: The pulmonic valve was normal in structure. Pulmonic valve regurgitation is trivial. Pulmonic regurgitation is trivial. Aorta: The aortic root, ascending aorta and aortic arch are all structurally normal, with no evidence of dilitation or obstruction. Venous: The inferior vena cava is normal in size with greater than 50% respiratory variability, suggesting right atrial pressure of 3 mmHg. IAS/Shunts: Increased thickness of the atrial septum sparing the fossa ovalis consistent with The interatrial septum appears to be lipomatous. No atrial level shunt detected by color flow Doppler. There is no evidence of a patent foramen ovale. No ventricular septal defect is seen or detected. There is no evidence of an atrial septal defect.  LEFT VENTRICLE PLAX 2D LVIDd:         3.75 cm LVIDs:         1.82 cm LV PW:         1.05 cm LV IVS:        0.94 cm LVOT diam:      2.10 cm LV SV:         50 ml LV SV Index:   23.95 LVOT Area:     3.46 cm  RIGHT VENTRICLE             IVC RV Basal diam:  3.23 cm     IVC diam: 1.44 cm RV Mid diam:    4.81 cm RV Length:      6.81 cm RV S prime:     21.10 cm/s TAPSE (M-mode): 1.8 cm LEFT ATRIUM           Index LA diam:      3.10 cm 1.54 cm/m LA Vol (A2C): 34.1 ml 16.94 ml/m LA Vol (A4C): 46.1 ml 22.90 ml/m  AORTIC VALVE LVOT Vmax:   98.80 cm/s LVOT Vmean:  74.000 cm/s LVOT VTI:    0.192 m  AORTA Ao Root diam: 3.10 cm Ao Asc diam:  3.60 cm TRICUSPID VALVE TR Peak grad:   64.0 mmHg TR Vmax:        400.00 cm/s  SHUNTS Systemic VTI:  0.19 m Systemic Diam: 2.10 cm  Fransico Him MD Electronically signed by Fransico Him MD Signature Date/Time: 06/12/2019/3:15:41 PM    Final    VAS [place] LOWER EXTREMITY VENOUS (DVT)  Result Date: 06/09/2019  Lower Venous Study Indications: Swelling.  Risk Factors: Cancer History of metastatic breast cancer. Comparison Study: No prior study on file Performing Technologist: Sharion Dove RVS  Examination Guidelines: A complete evaluation includes B-mode imaging, spectral Doppler, color Doppler, and power Doppler as needed of all accessible portions of each vessel. Bilateral testing is considered an integral part of a complete examination. Limited examinations for reoccurring indications may be performed as noted.  +---------+---------------+---------+-----------+----------+--------------+  RIGHT     Compressibility Phasicity Spontaneity Properties Thrombus Aging  +---------+---------------+---------+-----------+----------+--------------+  CFV       Partial         Yes       Yes                    Acute           +---------+---------------+---------+-----------+----------+--------------+  SFJ       Full                                                             +---------+---------------+---------+-----------+----------+--------------+  FV Prox   None                                             Acute            +---------+---------------+---------+-----------+----------+--------------+  FV Mid    None                                             Acute           +---------+---------------+---------+-----------+----------+--------------+  FV Distal None                                             Acute           +---------+---------------+---------+-----------+----------+--------------+  PFV       Full                                                             +---------+---------------+---------+-----------+----------+--------------+  POP       None            No        No                     Acute           +---------+---------------+---------+-----------+----------+--------------+  PTV       None                                             Acute           +---------+---------------+---------+-----------+----------+--------------+  PERO      None                                             Acute           +---------+---------------+---------+-----------+----------+--------------+  EIV                       Yes       Yes                                    +---------+---------------+---------+-----------+----------+--------------+   +----+---------------+---------+-----------+----------+--------------+  LEFT Compressibility Phasicity Spontaneity Properties Thrombus Aging  +----+---------------+---------+-----------+----------+--------------+  CFV  Full            Yes       Yes                                    +----+---------------+---------+-----------+----------+--------------+     Summary: Right: Findings consistent with acute deep vein thrombosis involving the  right common femoral vein, right femoral vein, right popliteal vein, right posterior tibial veins, and right peroneal veins. Left: No evidence of common femoral vein obstruction.  *See table(s) above for measurements and observations. Electronically signed by Harold Barban MD on 06/09/2019 at 8:27:06 AM.    Final     Vernell Leep, MD, [place], [facility]  To contact the attending provider between 7A-7P or the covering provider during after hours 7P-7A, please log into the web site [email or web address] and access using universal [place] password for that web site. If you do not have the password, please call the hospital operator.

## 2019-06-14 ENCOUNTER — Other Ambulatory Visit: Payer: Self-pay | Admitting: Oncology

## 2019-06-14 LAB — CULTURE, BLOOD (ROUTINE X 2)
Culture: NO GROWTH
Culture: NO GROWTH

## 2019-06-14 LAB — BASIC METABOLIC PANEL
Anion gap: 11 (ref 5–15)
BUN: 14 mg/dL (ref 8–23)
CO2: 27 mmol/L (ref 22–32)
Calcium: 8.3 mg/dL — ABNORMAL LOW (ref 8.9–10.3)
Chloride: 104 mmol/L (ref 98–111)
Creatinine, Ser: 1.07 mg/dL — ABNORMAL HIGH (ref 0.44–1.00)
GFR calc Af Amer: >60 mL/min (ref >60)
GFR calc non Af Amer: 55 mL/min — ABNORMAL LOW (ref >60)
Glucose, Bld: 72 mg/dL (ref 70–99)
Potassium: 3.2 mmol/L — ABNORMAL LOW (ref 3.5–5.1)
Sodium: 142 mmol/L (ref 135–145)

## 2019-06-14 MED ORDER — VANCOMYCIN HCL 1500 MG/300ML IV SOLN: 1500.0000 mg | INTRAVENOUS | Status: DC

## 2019-06-14 MED ORDER — SODIUM CHLORIDE 0.9 % IV SOLN
2.0000 g | Freq: Three times a day (TID) | INTRAVENOUS | Status: DC
  Administered 2019-06-14 – 2019-06-18 (×13): 2 g via INTRAVENOUS
  Filled 2019-06-14 (×16): qty 2

## 2019-06-14 MED ORDER — POTASSIUM CL IN DEXTROSE 5% 20 MEQ/L IV SOLN
20.0000 meq | INTRAVENOUS | Status: DC
  Administered 2019-06-14 (×2): 20 meq via INTRAVENOUS
  Filled 2019-06-14: qty 1000

## 2019-06-14 MED ORDER — POTASSIUM CHLORIDE 10 MEQ/100ML IV SOLN
10.0000 meq | INTRAVENOUS | Status: AC
  Administered 2019-06-14 (×6): 10 meq via INTRAVENOUS
  Filled 2019-06-14 (×5): qty 100

## 2019-06-14 MED ORDER — VANCOMYCIN HCL 1500 MG/300ML IV SOLN
1500.0000 mg | INTRAVENOUS | Status: DC
  Administered 2019-06-15 – 2019-06-17 (×3): 1500 mg via INTRAVENOUS
  Filled 2019-06-14 (×3): qty 300

## 2019-06-14 NOTE — Plan of Care (Signed)
  Problem: Education: Goal: Knowledge of General Education information will improve Description: Including pain rating scale, medication(s)/side effects and non-pharmacologic comfort measures Outcome: Progressing   Problem: Health Behavior/Discharge Planning: Goal: Ability to manage health-related needs will improve Outcome: Progressing   Problem: Clinical Measurements: Goal: Ability to maintain clinical measurements within normal limits will improve Outcome: Progressing Goal: Diagnostic test results will improve Outcome: Progressing   Problem: Coping: Goal: Level of anxiety will decrease Outcome: Progressing   Problem: Pain Managment: Goal: General experience of comfort will improve Outcome: Progressing   Problem: Safety: Goal: Ability to remain free from injury will improve Outcome: Progressing   Problem: Education: Goal: Knowledge of General Education information will improve Description: Including pain rating scale, medication(s)/side effects and non-pharmacologic comfort measures Outcome: Progressing   Elesa Hacker, RN

## 2019-06-14 NOTE — Progress Notes (Signed)
Pt taken off Bipap at this time. Placed on 15HFNC. Spo2 decreased to 80%. MD at bedside. Pt placed back on Bipap. Spo2 now 98%.

## 2019-06-14 NOTE — Progress Notes (Signed)
ANTICOAGULATION CONSULT NOTE - Initial Consult  Pharmacy Consult for Lovenox Indication: DVT  Allergies  Allergen Reactions  . Lisinopril Anaphylaxis, Swelling and Other (See Comments)    Swelling of tongue and mouth 11/05/16- tolerates Olmesartan  . Pepcid [Famotidine] Other (See Comments)    PPI H2, BLOCKERS LOWER GASTRIC PH WHICH WOULD LEAD TO SUBTHERAPEUTIC RILPIVIRINE LEVELS AND POTENTIAL VIROLOGICAL FAILURE WITH RESISTANCE  . Prilosec [Omeprazole] Other (See Comments)    PPI H2, BLOCKERS LOWER GASTRIC PH WHICH WOULD LEAD TO SUBTHERAPEUTIC RILPIVIRINE LEVELS AND POTENTIAL VIROLOGICAL FAILURE WITH RESISTANCE  . Tums [Calcium Carbonate Antacid] Other (See Comments)    TUMS ANTACIDS CAN LOWER GASTRIC PH WHICH COULD  LEAD TO SUBTHERAPEUTIC RILPIVIRINE LEVELS AND POTENTIAL VIROLOGICAL FAILURE WITH RESISTANCE TUMS CAN BE GIVEN BUT NEED CONSULT WITH ID PHARMACY RE TIMING. I PREFER HER TO AVOID ALL TOGETHER    Patient Measurements: Height: 5\' 7"  (170.2 cm) Weight: 284 lb (128.8 kg)(Air bed, unknown if zeroed prior to being placed in bed) IBW/kg (Calculated) : 61.6  Vital Signs: Temp: 97.8 F (36.6 C) (12/20 0424) Temp Source: Axillary (12/20 0424) BP: 93/63 (12/20 0820) Pulse Rate: 88 (12/20 0820)  Labs: Recent Labs    06/12/19 0455 06/13/19 0432 06/14/19 0511  HGB 8.1* 8.4*  --   HCT 25.5* 27.0*  --   PLT 233 261  --   CREATININE 1.21* 1.07* 1.07*    Estimated Creatinine Clearance: 75.2 mL/min (A) (by C-G formula based on SCr of 1.07 mg/dL (H)).   Medical History: Past Medical History:  Diagnosis Date  . AKI (acute kidney injury) ([place]) 06/02/2019  . Alopecia areata 11/28/2009  . Bell's palsy   . Cancer [facility]) 2005   Breast cancer   chemotherapy and radiation  . CKD (chronic kidney disease) stage 3, GFR 30-59 ml/min 06/08/2015  . Dry eye syndrome   . Family history of lung cancer   . Family history of non-Hodgkin's lymphoma   . Fasting hyperglycemia   . Gestational  diabetes    2001  . HIP FRACTURE, RIGHT 05/06/2008  . History of kidney stones   . HIV DISEASE 03/27/2006  . HYPERLIPIDEMIA, MIXED 12/15/2007  . HYPERTENSION 03/27/2006  . 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 MENORRHAGIA, POSTMENOPAUSAL 02/03/2009  . Osteoarthritis of left knee 06/08/2015  . OSTEOARTHROSIS, LOCAL, SCND, UNSPC SITE 04/07/2007  . Personal history of chemotherapy 11/2018  . PVD 04/07/2007  . Tinea capitis   . TRIGGER FINGER 05/06/2008  . Unspecified vitamin D deficiency 08/06/2007    [name] Pharmacy consulted for Lovenox therapy.   Patient has pancytopenia - Hemoglobin remain stable at 8.4, platelets improved. SCr remains stable at 1.07 with estimated CrCl 75 mL/min.  Awaiting dispo and discharge med plan for long term anticoagulation. Could continue Lovenox indefinitely or switch to reduced dose apixaban given DDI with HIV meds.   Goal of Therapy:  Monitor platelets by anticoagulation protocol: Yes   Plan:  Cont Lovenox 1 mg/kg (90mg ) SQ BID.  Monitor CBC closely with pancytopenia.  Monitor SCr for any need to adjust dose.      Thank you for allowing pharmacy to participate in this patient's care.  Layah Skousen L. Devin Going, [place] PGY1 Pharmacy Resident [phone] 06/14/19      8:58 AM  Please check AMION for all [place] phone numbers After 10:00 PM, call the [place] [phone]

## 2019-06-14 NOTE — Progress Notes (Signed)
Pharmacy Antibiotic Note  ZALEIGH BERMINGHAM is a 63 y.o. female admitted on 06/01/2019 with weakness and worsening ulcer.  Pt was started on Rocephin + Doxy for PNA.  Today is abx d#6 and pt continues to have significant SOB.  Pharmacy has been consulted for broaden abx coverage to Vancomycin and Cefepime.    SCr has improved to 1.07 and estimated CrCl remains >60 ml/min.  Plan: Increase vancomycin to 1500 mg IV Q24h.  Goal AUC 400-550. Expected AUC: 487 SCr used: 1.07 Vd coefficient 0.5  Increase cefepime 2g IV to Q8h   Height: 5\' 7"  (170.2 cm) Weight: 284 lb (128.8 kg)(Air bed, unknown if zeroed prior to being placed in bed) IBW/kg (Calculated) : 61.6  Temp (24hrs), Avg:98 F (36.7 C), Min:97.8 F (36.6 C), Max:98.2 F (36.8 C)  Recent Labs  Lab 06/09/19 0423 06/09/19 0742 06/10/19 0359 06/11/19 0422 06/12/19 0455 06/13/19 0432 06/14/19 0511  WBC 4.1  --  5.9 5.9 6.5 8.1  --   CREATININE  --  1.09* 1.03*  --  1.21* 1.07* 1.07*    Estimated Creatinine Clearance: 75.2 mL/min (A) (by C-G formula based on SCr of 1.07 mg/dL (H)).    Allergies  Allergen Reactions  . Lisinopril Anaphylaxis, Swelling and Other (See Comments)    Swelling of tongue and mouth 11/05/16- tolerates Olmesartan  . Pepcid [Famotidine] Other (See Comments)    PPI H2, BLOCKERS LOWER GASTRIC PH WHICH WOULD LEAD TO SUBTHERAPEUTIC RILPIVIRINE LEVELS AND POTENTIAL VIROLOGICAL FAILURE WITH RESISTANCE  . Prilosec [Omeprazole] Other (See Comments)    PPI H2, BLOCKERS LOWER GASTRIC PH WHICH WOULD LEAD TO SUBTHERAPEUTIC RILPIVIRINE LEVELS AND POTENTIAL VIROLOGICAL FAILURE WITH RESISTANCE  . Tums [Calcium Carbonate Antacid] Other (See Comments)    TUMS ANTACIDS CAN LOWER GASTRIC PH WHICH COULD  LEAD TO SUBTHERAPEUTIC RILPIVIRINE LEVELS AND POTENTIAL VIROLOGICAL FAILURE WITH RESISTANCE TUMS CAN BE GIVEN BUT NEED CONSULT WITH ID PHARMACY RE TIMING. I PREFER HER TO AVOID ALL TOGETHER    Antimicrobials this  admission: Rocephin 12/15 >> 12/18 Doxy 12/15 >> 12/18 Valacyclovir 12/11 >> 12/18 Vancomycin 12/18 >> Cefepime 12/18 >>  Microbiology results: 12/15 BCx >> ngtd   Thank you for allowing pharmacy to participate in this patient's care.  Shene Maxfield L. Devin Going, [place] PGY1 Pharmacy Resident [phone] 06/14/19      9:20 AM  Please check AMION for all [place] phone numbers After 10:00 PM, call the [place] [phone]

## 2019-06-14 NOTE — Progress Notes (Signed)
[facility]  PROGRESS NOTE  Kayla Price LFY:[id number] DOB: 1956-05-14 DOA: 06/01/2019 PCP: Lauree Chandler, NP Admit date - 06/01/2019   Admitting Physician Elwyn Reach, MD  Outpatient Primary MD for the patient is Lauree Chandler, NP  LOS - 11 Brief Narrative   Kayla Price is a 63 y.o. 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  In the ED findings notable for blood pressure 88/64, WBC 2.4, platelets 54, creatinine 1.75 (baseline 1.1), lactic acid within normal limits.  Patient was given IV fluids and admitted with working diagnosis of AKI, hypotension, and decubitus ulcerations of buttocks requiring aggressive wound care.  Hospital course complicated by superficial skin breakdown of groin/perineal area consistent with intertrigo/fungal infection with no signs of decubitus ulcers for which wound care saw and recommended appropriate dressings with nystatin powder/Desitin.  Given appearance of lesions for blue staining HSV PCR was sent, returned positive.  Patient was started on Valtrex on 12/11.  Patient started on empiric IV ceftriaxone doxycycline for presumed community-acquired pneumonia given chest x-ray findings, patient started on Lovenox for extensive right-sided DVT on 12/14.  CTA chest negative for pulmonary embolism.  Progressively worsening dyspnea and hypoxia since 12/17.  On 12/18, consulted PCCM who transferred patient to progressive care unit for BiPAP.  Oncology followed up on 12/18, noted poor prognosis and have requested palliative care consult and transitioning to morphine  drip for comfort if supportive measures fail.  Subjective   Patient denies dyspnea.  Remains on BiPAP.  States that Dr. Jana Hakim was by this morning to see her.  Asks about hospice.  Does not want to go home with hospice.  She had a sister who was at [employer].  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  - Lesions look less raw and patient reports much better in terms of pain. HSV-2 PCR of skin returned +  --Valtrex 1 g TID PO x 7 days, completed course. --f/u with ID as outpt arranged -Keeping this area clean/dry is key, -Discontinued Foley catheter on 12/17 but has to be placed back 12/19 due to comfort. -Improving.  Right lower extremity DVT.  Unilateral edema in setting of patient with known metastatic cancer.  Venous duplex positive for DVT -Initial plan to start Xarelto, but has interactions with HIV medications, so started on lovenox -In the interim we will continue Lovenox - CTA chest negative for PE.  Acute hypoxic respiratory failure.    Initially treated for community-acquired pneumonia with IV ceftriaxone and doxycycline.  CTA chest was negative for PE but showed findings of metastatic cancer.   On 12/18, noted worsening dyspnea and hypoxia, requiring 4 L/min oxygen, tachypneic.  Repeat chest x-ray shows left lower lobe consolidation with effusion.  Consulted and discussed with PCCM.  DD: Pneumonia, pulmonary edema, lymphangitis carcinoma.  Patient transferred to progressive care unit on 12/18, started BiPAP and has been BiPAP dependent since then.  CODE STATUS was verified by her oncologist and confirmed to be DNR.  Antibiotics broadened to IV cefepime and vancomycin.  TTE: LVEF 65-70%.  Severe pulmonary artery hypertension.  BNP normal.  Remains BiPAP dependent,  attempts to briefly wean this morning led to hypoxia again in the 80s.  Chest x-ray 12/19 personally reviewed: Appears stable and does not seem to have  significant pleural fluid to tap.  I discussed with Dr. Jana Hakim who met with patient this morning, discussed overall poor prognosis, inability to continue cancer chemotherapy due to unstable condition.  Patient seems to be slowly turning around towards comfort care option.  Await PMT follow-up with patient and her daughter regarding further decisions.  Has been n.p.o. since 12/18 due to BiPAP.  AKI on CKD, with urinary retention, back to baseline.  Creatinine improving with Foley in place.  Likely acute exacerbation in setting of brief hypotension and continue use of Benicar/also discontinued on recent hospitalization.  Renal ultrasound  on12/3 was negative for hydronephrosis -Avoid nephrotoxins -Acute kidney injury resolved.  Hypotension Resolved.  Hypothyroidism, stable -Continue Synthroid  Physical deconditioning.   - Prior level of independence: Independent with assistive devices.   - Although therapy is recommended SNF, not sure if patient will recover and make it out through this hospitalization.  HIV, controlled.   - CD4 greater than 400 on 10/26 -Continue darunavir -cobicistat, dolutegravir -Discuss with ID if able to change medications to decrease interactions with potential anticoagulation  Recent E. coli UTI.   Treated  Pancytopenia, likely chemotherapy associated.  -Leukopenia and thrombocytopenia have resolved.  Anemia stable.  Metastatic breast cancer, metastases to the brain(04/2019) and right pleural space(03/2018)   Status post whole brain radiation (11/5-11/18/2020 -Followed by Dr. Jana Hakim, plan for outpatient PET scan to determine next step in treatment -Dr. Virgie Dad follow-up appreciated.  Hypokalemia -  Replace IV.  Magnesium 2.  Hold off on lab work for now pending PMD follow-up.   Family Communication  : None at bedside. I discussed in detail with patient's daughter 12/19, updated care and answered questions.  Updated her regarding the critical  nature of her mother's illness and overall poor prognosis.  Code Status :  DNR  Disposition Plan  : To be determined.  Medically unstable for discharge.  Consults  :  Dr. Mariah Milling, [place], PCCM 12/18.  PMT  Procedures  : Foley catheter  DVT Prophylaxis  :   SCDs/Lovenox-full dose    Inpatient Medications Scheduled Meds: . Chlorhexidine Gluconate Cloth  6 each Topical Daily  . darunavir-cobicistat  1 tablet Oral QHS  . dolutegravir  50 mg Oral QHS   And  . rilpivirine  25 mg Oral QHS  . enoxaparin (LOVENOX) injection  90 mg Subcutaneous Q12H  . feeding supplement (ENSURE ENLIVE)  237 mL Oral TID BM  . levothyroxine  200 mcg Oral QAC breakfast  . maraviroc  150 mg Oral BID  . multivitamin with minerals  1 tablet Oral Daily   Continuous Infusions: . sodium chloride 10 mL/hr at 06/10/19 2141  . ceFEPime (MAXIPIME) IV    . dextrose 5 % with KCl 20 mEq / L 20 mEq (06/14/19 0933)  . [START ON 06/15/2019] vancomycin     PRN Meds:.sodium chloride, acetaminophen **OR** acetaminophen, calcium carbonate (dosed in mg elemental calcium), camphor-menthol **AND** hydrOXYzine, docusate sodium, HYDROcodone-acetaminophen, ondansetron **OR** ondansetron (ZOFRAN) IV, sodium chloride flush, sorbitol, zolpidem  Antibiotics  :   Anti-infectives (From admission, onward)   Start     Dose/Rate Route Frequency Ordered Stop   06/15/19 1000  vancomycin (VANCOREADY) IVPB 1500 mg/300 mL     1,500 mg 150 mL/hr over 120 Minutes Intravenous Every 24 hours 06/14/19 1017     06/14/19 1800  vancomycin (VANCOREADY) IVPB 1500 mg/300 mL  Status:  Discontinued     1,500 mg 150 mL/hr over 120 Minutes Intravenous Every 24 hours 06/14/19 0924 06/14/19 1017   06/14/19 1400  ceFEPIme (MAXIPIME) 2 g in sodium chloride 0.9 % 100 mL IVPB     2 g 200 mL/hr over 30 Minutes Intravenous Every 8 hours 06/14/19 0924     06/13/19 1800  vancomycin (VANCOREADY) IVPB 1250 mg/250 mL  Status:  Discontinued     1,250  mg 166.7 mL/hr over 90 Minutes Intravenous Every 24 hours 06/12/19 1506 06/14/19 0924   06/13/19 1130  ceFEPIme (MAXIPIME) 2 g in sodium chloride 0.9 % 100 mL IVPB  Status:  Discontinued     2 g 200 mL/hr over 30 Minutes Intravenous Every 12 hours 06/13/19 1159 06/14/19 0924   06/12/19 1800  vancomycin (VANCOREADY) IVPB 1750 mg/350 mL     1,750 mg 175 mL/hr over 120 Minutes Intravenous  Once 06/12/19 1506 06/12/19 1940   06/12/19 1600  ceFEPIme (MAXIPIME) 2 g in sodium chloride 0.9 % 100 mL IVPB  Status:  Discontinued     2 g 200 mL/hr over 30 Minutes Intravenous Every 12 hours 06/12/19 1506 06/13/19 1159   06/10/19 0600  cefTRIAXone (ROCEPHIN) 1 g in sodium chloride 0.9 % 100 mL IVPB  Status:  Discontinued     1 g 200 mL/hr over 30 Minutes Intravenous Every 24 hours 06/09/19 0732 06/12/19 1335   06/09/19 0800  doxycycline (VIBRAMYCIN) 100 mg in sodium chloride 0.9 % 250 mL IVPB  Status:  Discontinued     100 mg 125 mL/hr over 120 Minutes Intravenous Every 12 hours 06/09/19 0735 06/12/19 1335   06/09/19 0745  cefTRIAXone (ROCEPHIN) 1 g in sodium chloride 0.9 % 100 mL IVPB     1 g 200 mL/hr over 30 Minutes Intravenous  Once 06/09/19 0738 06/09/19 0924   06/05/19 2200  valACYclovir (VALTREX) tablet 1,000 mg     1,000 mg Oral 2 times daily 06/05/19 1418 06/12/19 1101   06/05/19 1000  valACYclovir (VALTREX) tablet 1,000 mg  Status:  Discontinued     1,000 mg Oral 3 times daily 06/05/19 0834 06/05/19 1418   06/02/19 2200  darunavir-cobicistat (PREZCOBIX) 800-150 MG per tablet 1 tablet     1 tablet Oral Daily at bedtime 06/02/19 0950     06/02/19 2200  dolutegravir (TIVICAY) tablet 50 mg     50 mg Oral Daily at bedtime 06/02/19 1455     06/02/19 2200  rilpivirine (EDURANT) tablet 25 mg     25 mg Oral Daily at bedtime 06/02/19 1455     06/02/19 1700  dolutegravir (TIVICAY) tablet 50 mg  Status:  Discontinued     50 mg Oral Daily with supper 06/02/19 1453 06/02/19 1456   06/02/19 1700   rilpivirine (EDURANT) tablet 25 mg  Status:  Discontinued     25 mg Oral Daily with supper 06/02/19 1453 06/02/19 1456   06/02/19 1415  fluconazole (DIFLUCAN) IVPB 200 mg  Status:  Discontinued     200 mg 100 mL/hr over 60 Minutes Intravenous Every 24 hours 06/02/19 1402 06/05/19 0834   06/02/19 1100  maraviroc (SELZENTRY) tablet 150 mg     150 mg Oral 2 times daily 06/02/19 0950     06/02/19 1000  cefdinir (OMNICEF) capsule 300 mg     300 mg Oral 2 times daily 06/02/19 0950 06/04/19 2207   06/02/19 1000  fluconazole (DIFLUCAN) tablet 400 mg  Status:  Discontinued     400 mg Oral Daily 06/02/19 0950 06/02/19 1402       Objective   Vitals:   06/14/19 0321 06/14/19 0424 06/14/19 0820 06/14/19 1208  BP:  98/65 93/63 (!) 83/63  Pulse: 86 74 88 83  Resp: (!) 21 (!) 21 18 (!) 24  Temp:  97.8 F (36.6 C)    TempSrc:  Axillary    SpO2: 98% 100%    Weight:  128.8 kg    Height:        SpO2: 100 % O2 Flow Rate (L/min): 6 L/min FiO2 (%): 40 %  Wt Readings from Last 3 Encounters:  06/14/19 128.8 kg  05/31/19 90.7 kg  05/19/19 88 kg     Intake/Output Summary (Last 24 hours) at 06/14/2019 1425 Last data filed at 06/14/2019 9030 Gross per 24 hour  Intake 661.3 ml  Output 2475 ml  Net -1813.7 ml    Physical Exam:  General exam: Pleasant middle-aged female, lying propped up in bed.  Appears comfortable on BiPAP without respiratory distress. RS: Diminished breath sounds in the bases.  Rest clear.  No increased work of breathing.  On BiPAP. CVS: S1 and S2 heard, RRR.  No JVD, murmurs or pedal edema.  Telemetry personally reviewed: Sinus rhythm.  Has right lower extremity edema pertaining to DVT. Abdominal exam: Nondistended, soft and nontender.  No organomegaly or masses appreciated. Skin: As examined on 12/16: Multiple ulcers in the groin/perivulval/upper thigh area which appear clean.  Reportedly improving compared to prior as per nursing and patient report. CNS: Alert and  oriented x2.  No focal neurological deficits. Psychiatry: Flat affect.  Impaired judgment and insight. Extremities: Right lower extremity mildly diffusely swollen compared to left without any other acute findings.  Likely related to DVT.     I have personally reviewed the following:   Data Reviewed:  CBC Recent Labs  Lab 06/09/19 0423 06/10/19 0359 06/11/19 0422 06/12/19 0455 06/13/19 0432  WBC 4.1 5.9 5.9 6.5 8.1  HGB 7.9* 8.5* 8.1* 8.1* 8.4*  HCT 23.7* 26.1* 25.5* 25.5* 27.0*  PLT 123* 151 182 233 261  MCV 94.0 93.9 98.1 97.3 98.9  MCH 31.3 30.6 31.2 30.9 30.8  MCHC 33.3 32.6 31.8 31.8 31.1  RDW 19.3* 19.4* 19.6* 20.0* 20.4*    Chemistries  Recent Labs  Lab 06/09/19 0742 06/10/19 0359 06/12/19 0455 06/13/19 0432 06/14/19 0511  NA 135 135 137 139 142  K 4.0 3.7 3.5 3.2* 3.2*  CL 101 100 102 104 104  CO2 _0 GLUCOSE 105* 110* 96 86 72  BUN _1 CREATININE 1.09* 1.03* 1.21* 1.07* 1.07*  CALCIUM 8.6* 8.5* 8.4* 8.4* 8.3*  MG  --   --   --  2.0  --   AST  --   --  28  --   --   ALT  --   --  22  --   --   ALKPHOS  --   --  54  --   --   BILITOT  --   --  0.4  --   --    ------------------------------------------------------------------------------------------------------------------ No results for input(s): CHOL, HDL, LDLCALC, TRIG, CHOLHDL, LDLDIRECT in the last 72 hours.  Lab Results  Component Value Date   HGBA1C 6.1 03/07/2019   - Micro Results Recent Results (from the past 240 hour(s))  Culture, blood (Routine X 2) w Reflex to ID Panel  Status: None   Collection Time: 06/09/19  7:50 AM   Specimen: BLOOD  Result Value Ref Range Status   Specimen Description BLOOD LEFT ANTECUBITAL  Final   Special Requests   Final    BOTTLES DRAWN AEROBIC ONLY Blood Culture results may not be optimal due to an inadequate volume of blood received in culture bottles   Culture   Final    NO GROWTH 5 DAYS Performed at [facility],  [place] [address] [place], [place] [address]    Report Status 06/14/2019 FINAL  Final  Culture, blood (Routine X 2) w Reflex to ID Panel     Status: None   Collection Time: 06/09/19  7:55 AM   Specimen: BLOOD LEFT HAND  Result Value Ref Range Status   Specimen Description BLOOD LEFT HAND  Final   Special Requests   Final    BOTTLES DRAWN AEROBIC ONLY Blood Culture results may not be optimal due to an inadequate volume of blood received in culture bottles   Culture   Final    NO GROWTH 5 DAYS Performed at [facility], [place] [address] [place],  [address]    Report Status 06/14/2019 FINAL  Final    Radiology Reports CT ANGIO CHEST PE W OR WO CONTRAST  Result Date: 06/09/2019 CLINICAL DATA:  Shortness of breath.  Metastatic breast cancer. EXAM: CT ANGIOGRAPHY CHEST WITH CONTRAST TECHNIQUE: Multidetector CT imaging of the chest was performed using the standard protocol during bolus administration of intravenous contrast. Multiplanar CT image reconstructions and MIPs were obtained to evaluate the vascular anatomy. CONTRAST:  66m OMNIPAQUE IOHEXOL 350 MG/ML SOLN COMPARISON:  PET CT 03/11/2019.  Chest CT 04/22/2018 FINDINGS: Cardiovascular: No filling defects in the pulmonary arteries to suggest pulmonary emboli. Tortuous, ectatic aorta with maximum diameter 3.7 cm. No dissection. Heart is enlarged. Mediastinum/Nodes: Extensive adenopathy in the mediastinum. Surgical clips in the right axilla. This is unchanged since recent PET CT. Lungs/Pleura: Extensive pleural nodularity/disease throughout right hemithorax, similar to recent PET CT. Right middle lobe mass is unchanged. Central necrotic portion again noted, unchanged. Scattered ground-glass airspace disease noted within both lungs concerning for pneumonia. Upper Abdomen: Gastrohepatic ligament lymph nodes again noted, unchanged. No acute findings. Musculoskeletal: Prior right mastectomy. Right chest wall Port-A-Cath remains in place,  unchanged. Sclerotic lesion within the T11 vertebral body is stable. Review of the MIP images confirms the above findings. IMPRESSION: No evidence of pulmonary embolus. Extensive mediastinal adenopathy, right pleural metastatic disease, and right middle lobe mass are all stable since prior PET CT. Worsening ground-glass airspace disease throughout both lungs concerning for pneumonia. Cardiomegaly. Tortuous, ectatic thoracic aorta. Electronically Signed   By: KRolm BaptiseM.D.   On: 06/09/2019 17:44   [place]  Result Date: 05/28/2019 CLINICAL DATA:  Acute kidney injury. EXAM: RENAL / URINARY TRACT ULTRASOUND COMPLETE COMPARISON:  PET CT scan 03/11/2019. FINDINGS: Right Kidney: Renal measurements: 9.9 x 4.3 x 5.0 cm = volume: 109.7 mL . Echogenicity within normal limits. No mass or hydronephrosis visualized. Left Kidney: Renal measurements: 9.6 x 5.4 x 4.9 cm = volume: Is 134.3. mL. Echogenicity within normal limits. No mass or hydronephrosis visualized. Bladder: Appears normal for degree of bladder distention. Other: Small right pleural effusion and gallstones are noted as seen on the prior exam. IMPRESSION: Negative for hydronephrosis.  Normal appearing kidneys. Right pleural effusion. Gallstones. Electronically Signed   By: TInge RiseM.D.   On: 05/28/2019 07:15   DG CHEST PORT 1  VIEW  Result Date: 06/13/2019 CLINICAL DATA:  Hypoxia. EXAM: PORTABLE CHEST 1 VIEW COMPARISON:  June 12, 2019 FINDINGS: The right Port-A-Cath is stable. No pneumothorax. Infiltrate in the left mid lung is more focal in the interval. Infiltrate in the right lung, particularly in the lower right lung is similar in the interval. The cardiomediastinal silhouette is stable. No other interval changes. Stable cardiomediastinal silhouette. IMPRESSION: 1. Stable infiltrate on the right, particularly in the right base. More focal infiltrate in the left mid lung compared to the previous study. Stable right Port-A-Cath.  Electronically Signed   By: Dorise Bullion III M.D   On: 06/13/2019 15:45   DG Chest Port 1 View  Result Date: 06/12/2019 CLINICAL DATA:  Hypoxia. Metastatic breast cancer. EXAM: PORTABLE CHEST 1 VIEW COMPARISON:  CTA chest 06/09/2019. One-view chest x-ray 06/08/2019. FINDINGS: The heart size is normal. The heart is enlarged. Right IJ Port-A-Cath is stable. A right pleural effusion has slightly increased. Right greater than left airspace opacities have progressed. Surgical clips are noted in the right axilla. IMPRESSION: 1. Progressive right greater than left airspace disease compatible with pneumonia. 2. Slight increase in right pleural effusion. 3. Stable right IJ Port-A-Cath. Electronically Signed   By: San Morelle M.D.   On: 06/12/2019 09:29   DG CHEST PORT 1 VIEW  Result Date: 06/08/2019 CLINICAL DATA:  Hypoxia EXAM: PORTABLE CHEST 1 VIEW COMPARISON:  07/10/2018 FINDINGS: Right Port-A-Cath in place with the tip in the SVC. Airspace disease in the right lower lung concerning for pneumonia. No confluent opacity on the left. Heart is borderline in size. Possible small right effusion. No acute bony abnormality. IMPRESSION: Right lower lung airspace disease concerning for pneumonia. Small right effusion suspected. Electronically Signed   By: Rolm Baptise M.D.   On: 06/08/2019 18:55   ECHOCARDIOGRAM COMPLETE  Result Date: 06/12/2019   ECHOCARDIOGRAM REPORT   Patient Name:   Kayla Price Date of Exam: 06/12/2019 Medical Rec #:  [id number]         Height:       67.0 in Accession #:    [id number]        Weight:       198.0 lb Date of Birth:  17-Jul-1955         BSA:          2.01 m Patient Age:    76 years          BP:           128/76 mmHg Patient Gender: F                 HR:           108 bpm. Exam Location:  Inpatient Procedure: 2D Echo, Color Doppler and Cardiac Doppler Indications:    R06.9 DOE  History:        Patient has no prior history of Echocardiogram examinations.                  Risk Factors:Hypertension and Dyslipidemia.  Sonographer:    Raquel Sarna Senior RDCS Referring Phys: [place]  1. Left ventricular ejection fraction, by visual estimation, is 65 to 70%. The left ventricle has normal function. There is no left ventricular hypertrophy.  2. The left ventricle has no regional wall motion abnormalities.  3. Global right ventricle has normal systolic function.The right ventricular size is mildly enlarged. No increase in right ventricular wall thickness.  4. Left atrial size was normal.  5. Right atrial size was severely dilated.  6. The mitral valve is normal in structure. Mild mitral valve regurgitation. No evidence of mitral stenosis.  7. The tricuspid valve is normal in structure. Tricuspid valve regurgitation is mild.  8. The aortic valve is tricuspid. Aortic valve regurgitation is not visualized. No evidence of aortic valve sclerosis or stenosis.  9. There is severe calcifcation of the aortic valve. 10. The pulmonic valve was normal in structure. Pulmonic valve regurgitation is trivial. 11. Severely elevated pulmonary artery systolic pressure at [id number]. 12. The inferior vena cava is normal in size with greater than 50% respiratory variability, suggesting right atrial pressure of 3 mmHg. 13. The interatrial septum appears to be lipomatous. FINDINGS  Left Ventricle: Left ventricular ejection fraction, by visual estimation, is 65 to 70%. The left ventricle has normal function. The left ventricle has no regional wall motion abnormalities. There is no left ventricular hypertrophy. Normal left atrial pressure. Right Ventricle: The right ventricular size is mildly enlarged. No increase in right ventricular wall thickness. Global RV systolic function is has normal systolic function. The tricuspid regurgitant velocity is 4.00 m/s, and with an assumed right atrial  pressure of 8 mmHg, the estimated right ventricular systolic pressure is severely elevated at 72.0 mmHg. Left  Atrium: Left atrial size was normal in size. Right Atrium: Right atrial size was severely dilated Pericardium: There is no evidence of pericardial effusion. Mitral Valve: The mitral valve is normal in structure. Mild mitral valve regurgitation. No evidence of mitral valve stenosis by observation. Tricuspid Valve: The tricuspid valve is normal in structure. Tricuspid valve regurgitation is mild. Aortic Valve: The aortic valve is tricuspid. . There is moderate thickening and severe calcifcation of the aortic valve. Aortic valve regurgitation is not visualized. The aortic valve is structurally normal, with no evidence of sclerosis or stenosis. There is moderate thickening of the aortic valve. There is severe calcifcation of the aortic valve. Pulmonic Valve: The pulmonic valve was normal in structure. Pulmonic valve regurgitation is trivial. Pulmonic regurgitation is trivial. Aorta: The aortic root, ascending aorta and aortic arch are all structurally normal, with no evidence of dilitation or obstruction. Venous: The inferior vena cava is normal in size with greater than 50% respiratory variability, suggesting right atrial pressure of 3 mmHg. IAS/Shunts: Increased thickness of the atrial septum sparing the fossa ovalis consistent with The interatrial septum appears to be lipomatous. No atrial level shunt detected by color flow Doppler. There is no evidence of a patent foramen ovale. No ventricular septal defect is seen or detected. There is no evidence of an atrial septal defect.  LEFT VENTRICLE PLAX 2D LVIDd:         3.75 cm LVIDs:         1.82 cm LV PW:         1.05 cm LV IVS:        0.94 cm LVOT diam:     2.10 cm LV SV:         50 ml LV SV Index:   23.95 LVOT Area:     3.46 cm  RIGHT VENTRICLE             IVC RV Basal diam:  3.23 cm     IVC diam: 1.44 cm RV Mid diam:    4.81 cm RV Length:      6.81 cm RV S prime:     21.10 cm/s TAPSE (M-mode): 1.8 cm LEFT ATRIUM  Index LA diam:      3.10 cm 1.54 cm/m LA  Vol (A2C): 34.1 ml 16.94 ml/m LA Vol (A4C): 46.1 ml 22.90 ml/m  AORTIC VALVE LVOT Vmax:   98.80 cm/s LVOT Vmean:  74.000 cm/s LVOT VTI:    0.192 m  AORTA Ao Root diam: 3.10 cm Ao Asc diam:  3.60 cm TRICUSPID VALVE TR Peak grad:   64.0 mmHg TR Vmax:        400.00 cm/s  SHUNTS Systemic VTI:  0.19 m Systemic Diam: 2.10 cm  Fransico Him MD Electronically signed by Fransico Him MD Signature Date/Time: 06/12/2019/3:15:41 PM    Final    VAS [place] LOWER EXTREMITY VENOUS (DVT)  Result Date: 06/09/2019  Lower Venous Study Indications: Swelling.  Risk Factors: Cancer History of metastatic breast cancer. Comparison Study: No prior study on file Performing Technologist: Sharion Dove RVS  Examination Guidelines: A complete evaluation includes B-mode imaging, spectral Doppler, color Doppler, and power Doppler as needed of all accessible portions of each vessel. Bilateral testing is considered an integral part of a complete examination. Limited examinations for reoccurring indications may be performed as noted.  +---------+---------------+---------+-----------+----------+--------------+ RIGHT    CompressibilityPhasicitySpontaneityPropertiesThrombus Aging +---------+---------------+---------+-----------+----------+--------------+ CFV      Partial        Yes      Yes                  Acute          +---------+---------------+---------+-----------+----------+--------------+ SFJ      Full                                                        +---------+---------------+---------+-----------+----------+--------------+ FV Prox  None                                         Acute          +---------+---------------+---------+-----------+----------+--------------+ FV Mid   None                                         Acute          +---------+---------------+---------+-----------+----------+--------------+ FV DistalNone                                         Acute           +---------+---------------+---------+-----------+----------+--------------+ PFV      Full                                                        +---------+---------------+---------+-----------+----------+--------------+ POP      None           No       No                   Acute          +---------+---------------+---------+-----------+----------+--------------+ PTV  None                                         Acute          +---------+---------------+---------+-----------+----------+--------------+ PERO     None                                         Acute          +---------+---------------+---------+-----------+----------+--------------+ EIV                     Yes      Yes                                 +---------+---------------+---------+-----------+----------+--------------+   +----+---------------+---------+-----------+----------+--------------+ LEFTCompressibilityPhasicitySpontaneityPropertiesThrombus Aging +----+---------------+---------+-----------+----------+--------------+ CFV Full           Yes      Yes                                 +----+---------------+---------+-----------+----------+--------------+     Summary: Right: Findings consistent with acute deep vein thrombosis involving the right common femoral vein, right femoral vein, right popliteal vein, right posterior tibial veins, and right peroneal veins. Left: No evidence of common femoral vein obstruction.  *See table(s) above for measurements and observations. Electronically signed by Harold Barban MD on 06/09/2019 at 8:27:06 AM.    Final     Vernell Leep, MD, [place], [facility]. [facility]  To contact the attending provider between 7A-7P or the covering provider during after hours 7P-7A, please log into the web site [email or web address] and access using universal [place] password for that web site. If you do not have the password, please call the hospital  operator.

## 2019-06-14 NOTE — Progress Notes (Unsigned)
Courtesy note:  Visited with patient this AM. The were just doing a trial off BiPap. She desatted rapidly to 81%. I told Amilee since she was not improving I was concerned we were deaing with lymphangitic spread of her cancer and not pneumonia--and that if it was lymphangitic spread I was not going to be able to treat that. She asked me "how long" she had and I told her honestly I did not know.  This afternoon her daughter called me from the patient's room. She says her mother has now been off BiPap for more than an hour and is saturating at 100%; she is eating some ensure.  I told [name] what I just wrote above and said we will all be delighted if Kursten has a reversible problem in the lungs. She understand we go by clinical response--we are not planning an open lung biopsy for example.  Daughter understood the situation as described. I encouraged her to continue to stay informed so she can best help her mother through her current transition.

## 2019-06-14 NOTE — Progress Notes (Signed)
Daily Progress Note   Patient Name: Kayla Price       Date: 06/14/2019 DOB: [birth year]  Age: 63 y.o. MRN#: [id number] Attending Physician: Modena Jansky, MD Primary Care Physician: Lauree Chandler, NP Admit Date: 06/01/2019  Reason for Consultation/Follow-up: Establishing goals of care  Subjective: I saw and examined Kayla Price this morning when she remained on BiPAP.  She states that Dr. Jana Hakim came to see her this morning and they discussed potential for hospice moving forward.  I returned this afternoon after RN updated me that her daughter was at the bedside.  On returning to the room, Kayla Price was on the phone with a family member.  She was explaining to that family member that plan moving forward would likely be for no further disease modifying therapy and working to elect her hospice benefits.  During this follow-up encounter, she was on high flow nasal cannula and satting 100%.  Her daughter was also present at the bedside at this point in time.  She expressed concern that she was not able to discuss with oncologist this morning and she questions decision to transition to hospice.  She states that she has specific questions she wants to discuss with oncologist but did not relay exactly what they were to me.  I let her know that Dr. Jana Hakim was not on-call today, but I could reach out to him tomorrow (assuming he is in the office) to see if he would be able to discuss her concerns with her.  Length of Stay: 11  Current Medications: Scheduled Meds:  . Chlorhexidine Gluconate Cloth  6 each Topical Daily  . darunavir-cobicistat  1 tablet Oral QHS  . dolutegravir  50 mg Oral QHS   And  . rilpivirine  25 mg Oral QHS  . enoxaparin (LOVENOX) injection  90 mg Subcutaneous  Q12H  . feeding supplement (ENSURE ENLIVE)  237 mL Oral TID BM  . levothyroxine  200 mcg Oral QAC breakfast  . maraviroc  150 mg Oral BID  . multivitamin with minerals  1 tablet Oral Daily    Continuous Infusions: . sodium chloride 10 mL/hr at 06/10/19 2141  . ceFEPime (MAXIPIME) IV 2 g (06/14/19 1547)  . dextrose 5 % with KCl 20 mEq / L 20 mEq (06/14/19 0933)  . [START ON 06/15/2019]  vancomycin      PRN Meds: sodium chloride, acetaminophen **OR** acetaminophen, calcium carbonate (dosed in mg elemental calcium), camphor-menthol **AND** hydrOXYzine, docusate sodium, HYDROcodone-acetaminophen, ondansetron **OR** ondansetron (ZOFRAN) IV, sodium chloride flush, sorbitol, zolpidem  Physical Exam  General: Alert, awake, in no acute distress.  HEENT: No bruits, no goiter, no JVD Heart: Regular rate and rhythm. No murmur appreciated. Lungs: Diminished air movement, clear Abdomen: Soft, nontender, nondistended, positive bowel sounds.  Ext: No significant edema Skin: Warm and dry Neuro: Grossly intact, nonfocal.         Vital Signs: BP 104/72   Pulse 93   Temp 97.8 F (36.6 C) (Axillary)   Resp (!) 23   Ht 5\' 7"  (1.702 m)   Wt 128.8 kg Comment: Air bed, unknown if zeroed prior to being placed in bed  LMP 11/06/2003   SpO2 100%   BMI 44.48 kg/m  SpO2: SpO2: 100 % O2 Device: O2 Device: High Flow Nasal Cannula O2 Flow Rate: O2 Flow Rate (L/min): 15 L/min  Intake/output summary:   Intake/Output Summary (Last 24 hours) at 06/14/2019 1712 Last data filed at 06/14/2019 1500 Gross per 24 hour  Intake 498.3 ml  Output 2475 ml  Net -1976.7 ml   LBM: Last BM Date: 06/13/19 Baseline Weight: Weight: 90.7 kg Most recent weight: Weight: 128.8 kg(Air bed, unknown if zeroed prior to being placed in bed)       Palliative Assessment/Data:      Patient Active Problem List   Diagnosis Date Noted  . Right lower lobe pneumonia 06/09/2019  . Pressure injury of skin 06/09/2019  .  Acute hypoxemic respiratory failure ([place]) 06/09/2019  . Right leg DVT ([place]) 06/09/2019  . Edema of right lower extremity 06/08/2019  . 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 Intertrigo 06/04/2019  . Pancytopenia ([place]) 06/04/2019  . Malnutrition of moderate degree 06/03/2019  . Alteration in skin integrity due to moisture 06/02/2019  . DNR (do not resuscitate) 06/02/2019  . AKI (acute kidney injury) ([place]) 05/28/2019  . Hyperkalemia 05/28/2019  . UTI (urinary tract infection) 05/28/2019  . Hypotension 05/28/2019  . Acute kidney injury superimposed on CKD ([place]) 05/28/2019  . Brain metastasis ([place]) 04/28/2019  . Hypothyroidism 04/28/2019  . Anemia of chronic disease 04/28/2019  . Encounter for antineoplastic chemotherapy 02/06/2019  . Port-A-Cath in place 08/01/2018  . Goals of care, counseling/discussion 06/26/2018  . Recurrent right pleural effusion 05/30/2018  . Malignant pleural effusion 05/19/2018  . Hilar adenopathy 05/15/2018  . Pleural effusion on right 04/25/2018  . Seasonal allergic rhinitis due to pollen 01/16/2018  . Aortic atherosclerosis ([place]) 11/11/2017  . Morbid obesity with BMI of 40.0-44.9, adult ([place]) 11/11/2017  . Genetic testing 06/27/2017  . Family history of non-Hodgkin's lymphoma   . Family history of lung cancer   . Endometrial thickening on ultrasound 01/25/2017  . Metastatic breast cancer ([place]) 11/05/2016  . Malignant neoplasm of upper-outer quadrant of right breast in female, estrogen receptor negative ([place]) 10/09/2016  . Primary localized osteoarthritis of left knee 02/06/2016  . Primary osteoarthritis of left knee 02/05/2016  . Osteoarthritis of left knee 06/08/2015  . CKD (chronic kidney disease), stage III 06/08/2015  . Vitamin D deficiency 09/23/2014  . Acute renal insufficiency 04/23/2014  . 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 Bell's palsy 12/04/2012  . Contact dermatitis 08/29/2011  . Dry eyes 08/20/2011  . Dry mouth  08/20/2011  . Neuropathy 04/09/2011  . Insomnia 04/09/2011  . Obesities, morbid ([place]) 09/27/2010  .  Endometrial polyp 12/22/2009  . Alopecia areata 11/28/2009  . MENORRHAGIA, POSTMENOPAUSAL 02/03/2009  . ABNORMAL GLANDULAR PAPANICOLAOU SMEAR OF CERVIX 11/04/2008  . ALOPECIA 05/06/2008  . Trigger finger, acquired 05/06/2008  . HIP FRACTURE, RIGHT 05/06/2008  . ARTHROSCOPY, LEFT KNEE, HX OF 02/03/2008  . HYPERLIPIDEMIA, MIXED 12/15/2007  . HAND PAIN, RIGHT 12/15/2007  . Other abnormal glucose 12/15/2007  . UNSPECIFIED VITAMIN D DEFICIENCY 08/06/2007  . TINEA CAPITIS 08/04/2007  . CNTC DERMATITIS&OTH ECZEMA DUE OTH CHEM PRODUCTS 08/04/2007  . PVD 04/07/2007  . Secondary localized osteoarthrosis 04/07/2007  . Human immunodeficiency virus (HIV) disease ([place]) 03/27/2006  . HTN (hypertension) 03/27/2006  . 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  She is now BiPAP dependent and desats whenever removing BiPAP.  Palliative consulted for goals of care.  Recommendations/Plan:  DNR  Patient was discussing possible transition to hospice with family member on the phone when I was in the room today.  Her daughter was also present at that time and states that she does not agree with proceeding with plan for hospice until she is able to speak to patient's oncologist.  We will continue to follow and progress conversation as able.  Code Status:    Code Status Orders  (From admission, onward)         Start     Ordered   06/01/19  2307  Do not attempt resuscitation (DNR)  Continuous    Question Answer Comment  In the event of cardiac or respiratory ARREST Do not call a "code blue"   In the event of cardiac or respiratory ARREST Do not perform Intubation, CPR, defibrillation or ACLS   In the event of cardiac or respiratory ARREST Use medication by any route, position, wound care, and other measures to relive pain and suffering. May use oxygen, suction and manual treatment of airway obstruction as needed for comfort.      06/01/19 2307        Code Status History    Date Active Date Inactive Code Status Order ID Comments User Context   05/28/2019 0556 05/31/2019 2235 DNR [id number]  Shela Leff, MD ED   04/28/2019 1745 05/01/2019 1934 Partial Code [id number]  Mckinley Jewel, MD ED   05/30/2018 1729 06/02/2018 2201 Full Code [id number]  Nani Skillern, PA-C Inpatient   11/05/2016 1145 11/06/2016 1713 Full Code [id number]  Fanny Skates, MD Inpatient   02/06/2016 1812 02/08/2016 1859 Full Code [id number]  Leighton Parody, PA-C Inpatient   Advance Care Planning Activity    Advance Directive Documentation     Most Recent Value  Type of Advance Directive  Living will  Pre-existing out of facility DNR order (yellow form or pink MOST form)  -  "MOST" Form in Place?  -       Prognosis:   Poor  Discharge Planning:  To Be Determined  Care plan was discussed with patient, daughter  Thank you for allowing the Palliative Medicine Team to assist in the care of this patient.   Total Time 30 Prolonged Time Billed No  Greater than 50%  of this time was spent counseling and coordinating care related to the above assessment and plan.  Micheline Rough, MD  Please contact Palliative Medicine Team phone at [phone] for questions and concerns.

## 2019-06-15 ENCOUNTER — Ambulatory Visit: Payer: Self-pay | Admitting: Radiation Oncology

## 2019-06-15 NOTE — Plan of Care (Signed)
  Problem: Clinical Measurements: Goal: Respiratory complications will improve Outcome: Progressing Off bipap and tolerating HFNC. Weaned from 15L to 54 and will continue to monitor. Jessie Foot, RN

## 2019-06-15 NOTE — Progress Notes (Addendum)
HEMATOLOGY-ONCOLOGY PROGRESS NOTE  SUBJECTIVE: Kayla Price remains on high flow nasal cannula at 7 L/min.  States that she has not required BiPAP at all today.  She thinks that her breathing is improving.  She does not appear to be a short of breath as she was the other day when I saw her.  She does not complain of any pain or chest discomfort.  Remains afebrile.  Oncology History  Malignant neoplasm of upper-outer quadrant of right breast in female, estrogen receptor negative ([place])  03/2004 Surgery   Right lumpectomy and SLNB for IDC, 0.5cm, 1/2 + SLN, grade 3, ER-, PR-, HER-2 -.  Treated with Doxorubicin and Cyclophosphamide x 4, followed by weekly Paclitaxel x 7 and adjuvant radiation.    10/01/2016 Initial Biopsy   Right breast upper outer quadrant biopsy: IDC, DCIS, triple negative, grade 3. Lymph node: Positive   11/05/2016 Surgery   Right modified radical mastectomy: IDC, grade 3, 2.6 cm, +LVI, triple negative, margins negative, 2/10 LN + metastases.  T2, N1a   11/20/2016 - 03/11/2017 Adjuvant Chemotherapy   Gemcitabine/Carboplatin given on days 1 and 8 on a 21 day cycle x 6 cycles   07/15/2018 - 07/15/2018 Chemotherapy   The patient had pembrolizumab (KEYTRUDA) 200 mg in sodium chloride 0.9 % 50 mL chemo infusion, 200 mg, Intravenous, Once, 0 of 6 cycles  for chemotherapy treatment.    07/15/2018 - 01/01/2019 Chemotherapy   The patient had atezolizumab (TECENTRIQ) 840 mg in sodium chloride 0.9 % 250 mL chemo infusion, 840 mg, Intravenous, Once, 6 of 8 cycles Dose modification: 1,680 mg (original dose 840 mg, Cycle 4, Reason: Provider Judgment), 840 mg (original dose 840 mg, Cycle 4, Reason: Provider Judgment) Administration: 840 mg (07/15/2018), 840 mg (08/01/2018), 840 mg (08/15/2018), 840 mg (08/29/2018), 840 mg (09/12/2018), 840 mg (09/26/2018), 840 mg (10/10/2018), 840 mg (10/24/2018), 1,680 mg (11/07/2018), 1,680 mg (12/05/2018)  for chemotherapy treatment.    12/19/2018 -  Chemotherapy   The patient  had pegfilgrastim (NEULASTA) injection 6 mg, 6 mg, Subcutaneous, Once, 1 of 1 cycle Administration: 6 mg (03/28/2019) pegfilgrastim (NEULASTA ONPRO KIT) injection 6 mg, 6 mg, Subcutaneous, Once, 4 of 8 cycles Administration: 6 mg (01/09/2019), 6 mg (01/23/2019), 6 mg (02/06/2019), 6 mg (02/20/2019), 6 mg (04/03/2019), 6 mg (04/17/2019) eriBULin mesylate (HALAVEN) 2.75 mg in sodium chloride 0.9 % 100 mL chemo infusion, 1.2 mg/m2 = 2.75 mg (100 % of original dose 1.2 mg/m2), Intravenous,  Once, 5 of 9 cycles Dose modification: 1.2 mg/m2 (original dose 1.2 mg/m2, Cycle 1, Reason: Provider Judgment) Administration: 2.75 mg (12/19/2018), 2.75 mg (01/09/2019), 2.75 mg (01/23/2019), 2.75 mg (02/06/2019), 2.75 mg (02/20/2019), 2.75 mg (03/06/2019), 2.75 mg (03/25/2019), 2.75 mg (04/03/2019), 2.75 mg (04/17/2019)  for chemotherapy treatment.    01/02/2019 - 01/02/2019 Chemotherapy   The patient had atezolizumab (TECENTRIQ) 1,200 mg in sodium chloride 0.9 % 250 mL chemo infusion, 1,200 mg, Intravenous, Once, 0 of 6 cycles  for chemotherapy treatment.    Metastatic breast cancer ([place])  11/05/2016 Initial Diagnosis   Recurrent cancer of right breast ([place])   07/15/2018 - 01/01/2019 Chemotherapy   The patient had atezolizumab (TECENTRIQ) 840 mg in sodium chloride 0.9 % 250 mL chemo infusion, 840 mg, Intravenous, Once, 5 of 8 cycles Dose modification: 1,680 mg (original dose 840 mg, Cycle 4, Reason: Provider Judgment), 840 mg (original dose 840 mg, Cycle 4, Reason: Provider Judgment) Administration: 840 mg (07/15/2018), 840 mg (08/01/2018), 840 mg (08/15/2018), 840 mg (08/29/2018), 840 mg (09/12/2018), 840 mg (09/26/2018),  840 mg (10/10/2018), 840 mg (10/24/2018), 1,680 mg (11/07/2018)  for chemotherapy treatment.    12/19/2018 -  Chemotherapy   The patient had pegfilgrastim (NEULASTA) injection 6 mg, 6 mg, Subcutaneous, Once, 1 of 1 cycle Administration: 6 mg (03/28/2019) pegfilgrastim (NEULASTA ONPRO KIT) injection 6 mg, 6 mg,  Subcutaneous, Once, 4 of 8 cycles Administration: 6 mg (01/09/2019), 6 mg (01/23/2019), 6 mg (02/06/2019), 6 mg (02/20/2019), 6 mg (04/03/2019), 6 mg (04/17/2019) eriBULin mesylate (HALAVEN) 2.75 mg in sodium chloride 0.9 % 100 mL chemo infusion, 1.2 mg/m2 = 2.75 mg (100 % of original dose 1.2 mg/m2), Intravenous,  Once, 5 of 9 cycles Dose modification: 1.2 mg/m2 (original dose 1.2 mg/m2, Cycle 1, Reason: Provider Judgment) Administration: 2.75 mg (12/19/2018), 2.75 mg (01/09/2019), 2.75 mg (01/23/2019), 2.75 mg (02/06/2019), 2.75 mg (02/20/2019), 2.75 mg (03/06/2019), 2.75 mg (03/25/2019), 2.75 mg (04/03/2019), 2.75 mg (04/17/2019)  for chemotherapy treatment.      PHYSICAL EXAMINATION: ECOG PERFORMANCE STATUS: 2 - Symptomatic, <50% confined to bed  Vitals:   06/14/19 2050 06/15/19 0520  BP: 98/66 116/78  Pulse: 82 83  Resp: 19 17  Temp:  98 F (36.7 C)  SpO2: 100% 100%   Filed Weights   06/10/19 2026 06/14/19 0424 06/15/19 0520  Weight: 198 lb 14.4 oz (90.2 kg) 284 lb (128.8 kg) 288 lb (130.6 kg)    Intake/Output from previous day: 12/20 0701 - 12/21 0700 In: 1694.5 [P.O.:937; I.V.:80.9; IV Piggyback:676.7] Out: 550 [Urine:550]  GENERAL:alert, appears comfortable LUNGS: Diminished breath sounds in the bilateral bases HEART: regular rate & rhythm and no murmurs and bilateral lower extremity edema, right greater than left ABDOMEN:abdomen soft, non-tender and normal bowel sounds NEURO: Alert, oriented, nonfocal  LABORATORY DATA:  I have reviewed the data as listed CMP Latest Ref Rng & Units 06/14/2019 06/13/2019 06/12/2019  Glucose 70 - 99 mg/dL 72 86 96  BUN 8 - 23 mg/dL 14 13 13  Creatinine 0.44 - 1.00 mg/dL 1.07(H) 1.07(H) 1.21(H)  Sodium 135 - 145 mmol/L 142 139 137  Potassium 3.5 - 5.1 mmol/L 3.2(L) 3.2(L) 3.5  Chloride 98 - 111 mmol/L 104 104 102  CO2 22 - 32 mmol/L 27 27 28  Calcium 8.9 - 10.3 mg/dL 8.3(L) 8.4(L) 8.4(L)  Total Protein 6.5 - 8.1 g/dL - - 5.3(L)  Total  Bilirubin 0.3 - 1.2 mg/dL - - 0.4  Alkaline Phos 38 - 126 U/L - - 54  AST 15 - 41 U/L - - 28  ALT 0 - 44 U/L - - 22    Lab Results  Component Value Date   WBC 8.1 06/13/2019   HGB 8.4 (L) 06/13/2019   HCT 27.0 (L) 06/13/2019   MCV 98.9 06/13/2019   PLT 261 06/13/2019   NEUTROABS 1.8 06/01/2019    CT ANGIO CHEST PE W OR WO CONTRAST  Result Date: 06/09/2019 CLINICAL DATA:  Shortness of breath.  Metastatic breast cancer. EXAM: CT ANGIOGRAPHY CHEST WITH CONTRAST TECHNIQUE: Multidetector CT imaging of the chest was performed using the standard protocol during bolus administration of intravenous contrast. Multiplanar CT image reconstructions and MIPs were obtained to evaluate the vascular anatomy. CONTRAST:  80mL OMNIPAQUE IOHEXOL 350 MG/ML SOLN COMPARISON:  PET CT 03/11/2019.  Chest CT 04/22/2018 FINDINGS: Cardiovascular: No filling defects in the pulmonary arteries to suggest pulmonary emboli. Tortuous, ectatic aorta with maximum diameter 3.7 cm. No dissection. Heart is enlarged. Mediastinum/Nodes: Extensive adenopathy in the mediastinum. Surgical clips in the right axilla. This is unchanged since recent PET CT. Lungs/Pleura:   Extensive pleural nodularity/disease throughout right hemithorax, similar to recent PET CT. Right middle lobe mass is unchanged. Central necrotic portion again noted, unchanged. Scattered ground-glass airspace disease noted within both lungs concerning for pneumonia. Upper Abdomen: Gastrohepatic ligament lymph nodes again noted, unchanged. No acute findings. Musculoskeletal: Prior right mastectomy. Right chest wall Port-A-Cath remains in place, unchanged. Sclerotic lesion within the T11 vertebral body is stable. Review of the MIP images confirms the above findings. IMPRESSION: No evidence of pulmonary embolus. Extensive mediastinal adenopathy, right pleural metastatic disease, and right middle lobe mass are all stable since prior PET CT. Worsening ground-glass airspace disease  throughout both lungs concerning for pneumonia. Cardiomegaly. Tortuous, ectatic thoracic aorta. Electronically Signed   By: Rolm Baptise M.D.   On: 06/09/2019 17:44   US RENAL  Result Date: 05/28/2019 CLINICAL DATA:  Acute kidney injury. EXAM: RENAL / URINARY TRACT ULTRASOUND COMPLETE COMPARISON:  PET CT scan 03/11/2019. FINDINGS: Right Kidney: Renal measurements: 9.9 x 4.3 x 5.0 cm = volume: 109.7 mL . Echogenicity within normal limits. No mass or hydronephrosis visualized. Left Kidney: Renal measurements: 9.6 x 5.4 x 4.9 cm = volume: Is 134.3. mL. Echogenicity within normal limits. No mass or hydronephrosis visualized. Bladder: Appears normal for degree of bladder distention. Other: Small right pleural effusion and gallstones are noted as seen on the prior exam. IMPRESSION: Negative for hydronephrosis.  Normal appearing kidneys. Right pleural effusion. Gallstones. Electronically Signed   By: Inge Rise M.D.   On: 05/28/2019 07:15   DG CHEST PORT 1 VIEW  Result Date: 06/13/2019 CLINICAL DATA:  Hypoxia. EXAM: PORTABLE CHEST 1 VIEW COMPARISON:  June 12, 2019 FINDINGS: The right Port-A-Cath is stable. No pneumothorax. Infiltrate in the left mid lung is more focal in the interval. Infiltrate in the right lung, particularly in the lower right lung is similar in the interval. The cardiomediastinal silhouette is stable. No other interval changes. Stable cardiomediastinal silhouette. IMPRESSION: 1. Stable infiltrate on the right, particularly in the right base. More focal infiltrate in the left mid lung compared to the previous study. Stable right Port-A-Cath. Electronically Signed   By: Dorise Bullion III M.D   On: 06/13/2019 15:45   DG Chest Port 1 View  Result Date: 06/12/2019 CLINICAL DATA:  Hypoxia. Metastatic breast cancer. EXAM: PORTABLE CHEST 1 VIEW COMPARISON:  CTA chest 06/09/2019. One-view chest x-ray 06/08/2019. FINDINGS: The heart size is normal. The heart is enlarged. Right IJ  Port-A-Cath is stable. A right pleural effusion has slightly increased. Right greater than left airspace opacities have progressed. Surgical clips are noted in the right axilla. IMPRESSION: 1. Progressive right greater than left airspace disease compatible with pneumonia. 2. Slight increase in right pleural effusion. 3. Stable right IJ Port-A-Cath. Electronically Signed   By: San Morelle M.D.   On: 06/12/2019 09:29   DG CHEST PORT 1 VIEW  Result Date: 06/08/2019 CLINICAL DATA:  Hypoxia EXAM: PORTABLE CHEST 1 VIEW COMPARISON:  07/10/2018 FINDINGS: Right Port-A-Cath in place with the tip in the SVC. Airspace disease in the right lower lung concerning for pneumonia. No confluent opacity on the left. Heart is borderline in size. Possible small right effusion. No acute bony abnormality. IMPRESSION: Right lower lung airspace disease concerning for pneumonia. Small right effusion suspected. Electronically Signed   By: Rolm Baptise M.D.   On: 06/08/2019 18:55   ECHOCARDIOGRAM COMPLETE  Result Date: 06/12/2019   ECHOCARDIOGRAM REPORT   Patient Name:   Kayla Price Date of Exam: 06/12/2019 Medical Rec #:  [id number]         Height:       67.0 in Accession #:    [id number]        Weight:       198.0 lb Date of Birth:  [birth year]         BSA:          2.01 m Patient Age:    85 years          BP:           128/76 mmHg Patient Gender: F                 HR:           108 bpm. Exam Location:  Inpatient Procedure: 2D Echo, Color Doppler and Cardiac Doppler Indications:    R06.9 DOE  History:        Patient has no prior history of Echocardiogram examinations.                 Risk Factors:Hypertension and Dyslipidemia.  Sonographer:    Raquel Sarna Senior RDCS Referring Phys: [place]  1. Left ventricular ejection fraction, by visual estimation, is 65 to 70%. The left ventricle has normal function. There is no left ventricular hypertrophy.  2. The left ventricle has no regional wall motion  abnormalities.  3. Global right ventricle has normal systolic function.The right ventricular size is mildly enlarged. No increase in right ventricular wall thickness.  4. Left atrial size was normal.  5. Right atrial size was severely dilated.  6. The mitral valve is normal in structure. Mild mitral valve regurgitation. No evidence of mitral stenosis.  7. The tricuspid valve is normal in structure. Tricuspid valve regurgitation is mild.  8. The aortic valve is tricuspid. Aortic valve regurgitation is not visualized. No evidence of aortic valve sclerosis or stenosis.  9. There is severe calcifcation of the aortic valve. 10. The pulmonic valve was normal in structure. Pulmonic valve regurgitation is trivial. 11. Severely elevated pulmonary artery systolic pressure at 79KWIO. 12. The inferior vena cava is normal in size with greater than 50% respiratory variability, suggesting right atrial pressure of 3 mmHg. 13. The interatrial septum appears to be lipomatous. FINDINGS  Left Ventricle: Left ventricular ejection fraction, by visual estimation, is 65 to 70%. The left ventricle has normal function. The left ventricle has no regional wall motion abnormalities. There is no left ventricular hypertrophy. Normal left atrial pressure. Right Ventricle: The right ventricular size is mildly enlarged. No increase in right ventricular wall thickness. Global RV systolic function is has normal systolic function. The tricuspid regurgitant velocity is 4.00 m/s, and with an assumed right atrial  pressure of 8 mmHg, the estimated right ventricular systolic pressure is severely elevated at 72.0 mmHg. Left Atrium: Left atrial size was normal in size. Right Atrium: Right atrial size was severely dilated Pericardium: There is no evidence of pericardial effusion. Mitral Valve: The mitral valve is normal in structure. Mild mitral valve regurgitation. No evidence of mitral valve stenosis by observation. Tricuspid Valve: The tricuspid valve is  normal in structure. Tricuspid valve regurgitation is mild. Aortic Valve: The aortic valve is tricuspid. . There is moderate thickening and severe calcifcation of the aortic valve. Aortic valve regurgitation is not visualized. The aortic valve is structurally normal, with no evidence of sclerosis or stenosis. There is moderate thickening of the aortic valve. There is severe calcifcation of the aortic valve. Pulmonic Valve: The pulmonic valve  was normal in structure. Pulmonic valve regurgitation is trivial. Pulmonic regurgitation is trivial. Aorta: The aortic root, ascending aorta and aortic arch are all structurally normal, with no evidence of dilitation or obstruction. Venous: The inferior vena cava is normal in size with greater than 50% respiratory variability, suggesting right atrial pressure of 3 mmHg. IAS/Shunts: Increased thickness of the atrial septum sparing the fossa ovalis consistent with The interatrial septum appears to be lipomatous. No atrial level shunt detected by color flow Doppler. There is no evidence of a patent foramen ovale. No ventricular septal defect is seen or detected. There is no evidence of an atrial septal defect.  LEFT VENTRICLE PLAX 2D LVIDd:         3.75 cm LVIDs:         1.82 cm LV PW:         1.05 cm LV IVS:        0.94 cm LVOT diam:     2.10 cm LV SV:         50 ml LV SV Index:   23.95 LVOT Area:     3.46 cm  RIGHT VENTRICLE             IVC RV Basal diam:  3.23 cm     IVC diam: 1.44 cm RV Mid diam:    4.81 cm RV Length:      6.81 cm RV S prime:     21.10 cm/s TAPSE (M-mode): 1.8 cm LEFT ATRIUM           Index LA diam:      3.10 cm 1.54 cm/m LA Vol (A2C): 34.1 ml 16.94 ml/m LA Vol (A4C): 46.1 ml 22.90 ml/m  AORTIC VALVE LVOT Vmax:   98.80 cm/s LVOT Vmean:  74.000 cm/s LVOT VTI:    0.192 m  AORTA Ao Root diam: 3.10 cm Ao Asc diam:  3.60 cm TRICUSPID VALVE TR Peak grad:   64.0 mmHg TR Vmax:        400.00 cm/s  SHUNTS Systemic VTI:  0.19 m Systemic Diam: 2.10 cm  Traci Turner MD  Electronically signed by Traci Turner MD Signature Date/Time: 06/12/2019/3:15:41 PM    Final    VAS US LOWER EXTREMITY VENOUS (DVT)  Result Date: 06/09/2019  Lower Venous Study Indications: Swelling.  Risk Factors: Cancer History of metastatic breast cancer. Comparison Study: No prior study on file Performing Technologist: Candace Kanady RVS  Examination Guidelines: A complete evaluation includes B-mode imaging, spectral Doppler, color Doppler, and power Doppler as needed of all accessible portions of each vessel. Bilateral testing is considered an integral part of a complete examination. Limited examinations for reoccurring indications may be performed as noted.  +---------+---------------+---------+-----------+----------+--------------+ RIGHT    CompressibilityPhasicitySpontaneityPropertiesThrombus Aging +---------+---------------+---------+-----------+----------+--------------+ CFV      Partial        Yes      Yes                  Acute          +---------+---------------+---------+-----------+----------+--------------+ SFJ      Full                                                        +---------+---------------+---------+-----------+----------+--------------+ FV Prox  None                                           Acute          +---------+---------------+---------+-----------+----------+--------------+ FV Mid   None                                         Acute          +---------+---------------+---------+-----------+----------+--------------+ FV DistalNone                                         Acute          +---------+---------------+---------+-----------+----------+--------------+ PFV      Full                                                        +---------+---------------+---------+-----------+----------+--------------+ POP      None           No       No                   Acute           +---------+---------------+---------+-----------+----------+--------------+ PTV      None                                         Acute          +---------+---------------+---------+-----------+----------+--------------+ PERO     None                                         Acute          +---------+---------------+---------+-----------+----------+--------------+ EIV                     Yes      Yes                                 +---------+---------------+---------+-----------+----------+--------------+   +----+---------------+---------+-----------+----------+--------------+ LEFTCompressibilityPhasicitySpontaneityPropertiesThrombus Aging +----+---------------+---------+-----------+----------+--------------+ CFV Full           Yes      Yes                                 +----+---------------+---------+-----------+----------+--------------+     Summary: Right: Findings consistent with acute deep vein thrombosis involving the right common femoral vein, right femoral vein, right popliteal vein, right posterior tibial veins, and right peroneal veins. Left: No evidence of common femoral vein obstruction.  *See table(s) above for measurements and observations. Electronically signed by Harold Barban MD on 06/09/2019 at 8:27:06 AM.    Final     ASSESSMENT: 63 y.o. [place] woman    (1) status post right lumpectomy and sentinel lymph node sampling October 2005 for a 0.6 cm invasive ductal carcinoma involving one out of 2 sentinel lymph nodes sampled, grade 3, triple-negative, treated adjuvantly with doxorubicin and cyclophosphamide 4 followed by weekly paclitaxel 7, followed by adjuvant radiation   RECURRENT DISEASE: (2)  status post right breast upper outer quadrant biopsy and right axillary lymph node biopsy 10/01/2016, both positive for a T2 N1, stage IIIB invasive ductal carcinoma, triple negative, with an MIB-1 of 50-70%   (3) status post right modified radical  mastectomy 11/05/2016 showing a pT2 pN1, stage IIIB invasive ductal carcinoma, grade 3, triple negative, with negative margins   (4) not a candidate for radiation given prior history   (5) adjuvant chemotherapy consisting of carboplatin and gemcitabine given days 1 and 8 of each 21 day cycle, for 6 cycles, starting 11/20/2016, completed 03/11/2017             (a) day 8 cycle 2 omitted because of neutropenia; Neupogen/Neulasta added   (5) HIV positivity: under care of [facility] ([place])   (6) Genetic testing 06/17/2017:  no pathogenic mutations. Genes tested: APC, ATM, AXIN2, BARD1, BLM, BMPR1A, BRCA1, BRCA2, BRIP1, CDH1, CDK4, CDKN2A (p14ARF), CDKN2A (p16INK4a), CEBPA, CHEK2, CTNNA1, DICER1, EPCAM*, GATA2, GREM1*, HRAS, KIT, MEN1, MLH1, MSH2, MSH3, MSH6, MUTYH, NBN, NF1, PALB2, PDGFRA, PMS2, POLD1, POLE, PTEN, RAD50, RAD51C, RAD51D, RUNX1, SDHB, SDHC, SDHD, SMAD4, SMARCA4, STK11, TERC, TERT, TP53, TSC1, TSC2, VHL. The following genes were evaluated for sequence changes only: HOXB13*, NTHL1*, SDHA.              (a) A variant of uncertain significance (VUS) in a gene called NTHL1 was also noted. c.736G>A (p.[id number])   METASTATIC DISEASE: November 2019 (1) Patient seen in urgent care and ultimately ED on 04/14/2018 for shortness of breath, chest xray demonstrated Right pleural effusion.   (a) right thoracenteses on 10/21 and 11/4 results show atypical cells, non diagnostic (b) CT chest 04/22/2018 shows re-accumulation of fluid, and right pleural nodularity. (c) PET scan on 05/01/2018 shows hypermetabolic pleural based metastases, right CP angle nodal metastases, no evidence of malignancy in abdomen and pelvis. (d) bronchoscopy with biopsy by Dr. Byrum and BAL on 05/15/2018 was non diagnostic             (e) VAT biopsy of the right pleura 05/30/2018 confirms carcinoma, triple negative             (f) [facility] shows PD-L1 positive (1% in TIIC); otherwise microsatellite stable, TMB low (5/Mb), no PIK3  mutations; other mutations suggest sensitivity to MTOR inhibitors and several TKIs   (2) Atezolizumab started 07/15/2018 given every other week             (a) PET 08/13/2018 shows tumor Right hemithorax (new baseline study)             (b) Atezo changed to Q4w starting with 11/07/2018 dose             (c) PET 11/28/2018 documents progression in the right lung and pleural area as well as lymph nodes, and a T1 skeletal metastasis             (d) atezolizumab discontinued after 12/05/2018 dose   (3) eribulin day 1 and 8 of every 21 day cycle started 12/19/2018             (a) changed to every other week with Neulsta support due to neutropenia causing treatment delays and her h/o HIV (delays due to insurance denial of onpro after 02/20/2019 dose)             (b) PET scan on 03/11/2019 showed stable to improved measurable disease, with multiple new bone lesions             (c) zoledronate given   every 12 weeks starting on 03/25/2019   (4) brain MRI 04/28/2019 documents multiple intracranial metastases             (a) whole brain radiation 04/30/2019 - 05/13/2019, 30 Gy in 10 fractions  (5) right lower extremity DVT diagnosed December 2020     PLAN:  Jakalyn is now on HFNC with O2 sats documented as being 100%.  Has not required BiPAP today.  She does not appear to be a short of breath that she did the other day.  She remains on cefepime and IV vancomycin.  If she continues to improve clinically, then we will continue our original plan which is to see her for follow-up as an outpatient to discuss any additional chemotherapy.  She is supposed to have a PET scan for restaging and we will plan to reschedule this once we know a discharge date, assuming she continues to improve.   LOS: 12 days   Mikey Bussing, DNP, AGPCNP-BC, AOCNP 06/15/19   ADDENDUM: Donnalee appears to be turning the corner, with improved sats and decreased work of breathing off BiPap. I spoke with her today and explained that  [facility] if we are not going to do any more treatment, but at this point she would consider further therapy, especially further brain XRT if necessary. So the plan reverts to placement-- she had chosen [facility] previously and hopefully that can still be operationalized.  Aaliah would like to go home for Christmas, even if only for part of the day. I have discussed that with her daughter [name] and she does not think she can lift and clean Joycelyn Schmid. Perhaps if she had help that day it might be possible. Otherwise [name] will have to bring Christmas to [place] in the hospital or at [facility] place.  I am delighted her breathing is better. She still has widely disseminated breast cancer and it is not clear if she will be able to tolerate any systemic therapy. If she is discharged to an SNF I will plan to see her the 2d week in January and take it from there.  Greatappreciate Palliative Care's help in this as so many other cases!     Chauncey Cruel, MD Medical Oncology and Hematology [facility] [address] [place], [place] [address] Tel. [phone]    Fax. [phone]

## 2019-06-15 NOTE — Progress Notes (Signed)
This encounter was created in error - please disregard.

## 2019-06-15 NOTE — Progress Notes (Signed)
PROGRESS NOTE   Kayla Price  FYB:[id number]    DOB: 11/06/1955    DOA: 06/01/2019  PCP: Lauree Chandler, NP   I have briefly reviewed patients previous medical records in [facility].  Chief Complaint:   Chief Complaint  Patient presents with   Cellulitis   PICC Line Removal    Brief Narrative:  63 year old female with PMH of metastatic breast cancer with metastasis to brain and right pleura, s/p whole brain radiation, HIV, CKD stage II, recently hospitalized 05/27/2019-05/31/2023 acute kidney injury, hypotension and superficial ulcerations of the groin/perineum who presented the next day of discharge 06/01/2019 due to worsening drainage from her known skin ulcerations along with generalized weakness.  She was admitted for hypotension, acute kidney injury and ulcers of her groin and perineum.  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  Hospital course complicated by community-acquired pneumonia, extensive right lower extremity DVT, acute hypoxic respiratory failure.  She was transferred to progressive care unit on 12/17, PCCM consulted, BiPAP initiated, primary oncologist and PMT consulted, since she was doing so poorly, discussions regarding transitioning to comfort care were ongoing.  However over the last 24 to 48 hours, patient has made slow and steady improvement with decrease oxygen requirement.  If she continues to make improvement, plans are for DC to SNF with palliative care team following and outpatient follow-up with oncology for further cancer treatment.  Assessment & Plan:  Principal Problem:   AKI (acute kidney injury) ([place]) Active Problems:   Human immunodeficiency virus (HIV) disease ([place])   HYPERLIPIDEMIA, MIXED   HTN (hypertension)   PVD   Alopecia areata   BREAST CANCER, HX OF   Hypothyroidism   Hypotension   Acute kidney injury superimposed on CKD (HCC)   Alteration in skin integrity due to moisture    DNR (do not resuscitate)   Malnutrition of moderate degree   Intertrigo   Pancytopenia (HCC)   HSV-2 infection   Genital HSV   Edema of right lower extremity   Right lower lobe pneumonia   Pressure injury of skin   Acute hypoxemic respiratory failure (HCC)   Right leg DVT (HCC)   HSV-2 genital ulcers  HSV-2 PCR of skin positive.  Completed a 1 week course of Valtrex as per ID recommendations with significant improvement.  Continue local care to avoid secondary infection or worsening.  Foley catheter that was placed for comfort over the weekend to be discontinued ASAP, hopefully tomorrow.  Outpatient follow-up with ID in 4 to 6 weeks.  Right lower extremity DVT  In the setting of known metastatic cancer.  Has asymmetric RLE unilateral edema.  Edema decreasing.  Initial plan was to start Xarelto but has interactions with her HIV medications and hence she was placed on full dose Lovenox.  CTA chest negative for PE.  Multi lobar pneumonia/acute respiratory failure with hypoxia  Initially treated for community-acquired pneumonia with IV ceftriaxone and doxycycline.  CTA chest was negative for PE but showed findings of metastatic cancer.  On 12/18, developed worsening dyspnea and hypoxia with increased oxygen requirements.  PCCM was consulted.  DD: Pneumonia versus pulmonary edema versus lymphangitic carcinoma.  Patient was transferred to progressive care unit on 12/18, started on BiPAP and remained on it for 2 to 3 days with concerns for BiPAP dependence.  CODE STATUS clarified by her oncologist/DNR.  Antibiotics were broadened to IV cefepime and vancomycin.  TTE: LVEF 65-70% and severe pulmonary hypertension.  BNP normal.  She even received a dose of IV Lasix.  PCCM signed off a couple days ago.  Due to inability to wean from BiPAP and underlying metastatic cancer, her Oncologist consulted PMT with consideration for transitioning to hospice.  Daughter however was not  completely on board.  Over the last 24 hours, patient seems to have made gradual improvement, has come off of BiPAP, oxygen requirement has been weaned down to 7 L/min HFNC, dyspnea improved.  Continue to wean oxygen as tolerated, incentive spirometry, consider transitioning to oral Augmentin to complete total 10 days course of antibiotics.  Acute kidney injury complicating stage II chronic kidney disease  Acute kidney injury resolved.  Renal ultrasound 12/3 - for hydronephrosis.  Acute urinary retention  Foley catheter that was initially placed had been removed and she was voiding well.  However this had to be replaced because of acute respiratory status, pure wick rubbing against her groin ulcers causing discomfort.  Now that she is improved, consider discontinuing Foley catheter on 12/22.  Hypotension/essential hypertension  Resolved.  Blood pressures controlled off of medications.  Hypothyroidism  Continue Synthroid.  TSH normal on 04/03/2019.  HIV disease  ID sign off 12/11 appreciated.  HIV disease well-controlled.    CD4 >400 on 10/26  Continue ART as below.  Consider discussing with ID regarding change of HIV medications that would allow use of oral anticoagulation as directed by oncology or could continue full dose Lovenox.  Outpatient ID follow-up in 4 to 6 weeks.  Recent E. coli UTI  Treated  Pancytopenia/anemia of chronic disease.  Leukopenia and thrombocytopenia resolved.  Anemia stable.  Metastatic breast cancer, metastasis to the brain (04/2019) and right pleural space (03/2018)  S/p whole brain radiation  Followed by Dr. Jana Hakim.  Since she has made improvement after current acute illness, outpatient follow-up with him for repeat PET scan and consideration for cancer treatment.  Hypokalemia  Replaced IV.  Follow BMP in a.m.  Physical deconditioning/adult failure to thrive  Prior level of independence: She was independent with assist  devices.  Failure to thrive multifactorial due to advanced malignancy most importantly.  Body mass index is 45.11 kg/m.?  Accurate  Nutritional Status Nutrition Problem: Moderate Malnutrition Etiology: chronic illness(metastatic breast cancer) Signs/Symptoms: mild fat depletion, severe muscle depletion, mild muscle depletion, percent weight loss Percent weight loss: 29 % Interventions: Liberalize Diet, MVI, Ensure Enlive (each supplement provides 350kcal and 20 grams of protein)  DVT prophylaxis: On full dose Lovenox Code Status: DNR Family Communication: Discussed with patient's daughter on 12/19. Disposition: DC to SNF pending further clinical improvement, with palliative care consultation at SNF.   Consultants:   Medical oncology Infectious disease PCCM Palliative care medicine  Procedures:   BiPAP Foley catheter  Antimicrobials:   IV ceftriaxone and doxycycline-discontinued IV cefepime and vancomycin >   Subjective:  Patient denies dyspnea.  Able to come off of BiPAP and has been gradually weaned down to 7 L/min HFNC oxygen.  Ate little for breakfast, decreased appetite.  Denies pain of groin ulcers.  Objective:   Vitals:   06/14/19 2024 06/14/19 2050 06/15/19 0520 06/15/19 1541  BP: 90/70 98/66 116/78 105/74  Pulse: 93 82 83 92  Resp: 15 19 17 20   Temp: 98.2 F (36.8 C)  98 F (36.7 C) 98.4 F (36.9 C)  TempSrc: Oral  Axillary Oral  SpO2: 100% 100% 100% 96%  Weight:   130.6 kg   Height:       Patient was interviewed  and examined along with her female RN in room.  General exam: Middle-aged female, looks older than stated age, moderately built and frail, chronically ill looking lying comfortably propped up in bed.  Looks much improved compared to 48/72 hours. Respiratory system: Reduced breath sounds in the bases with scattered occasional basal crackles but otherwise clear to auscultation without wheezing, rhonchi.  No increased work of breathing.  Right  upper chest Port-A-Cath. Cardiovascular system: S1 & S2 heard, RRR. No JVD, murmurs, rubs, gallops or clicks.  Trace right leg edema.  Telemetry personally reviewed: Sinus rhythm. Gastrointestinal system: Abdomen is nondistended, soft and nontender. No organomegaly or masses felt. Normal bowel sounds heard. Central nervous system: Alert and oriented x2. No focal neurological deficits. Extremities: Symmetric 5 x 5 power.  Diffuse asymmetric right lower extremity swelling without acute findings.  Good distal pulses felt. Skin: Multiple superficial ulcers of different sizes in the groin/perivulval/upper thigh area which are clean and without acute findings.  Patient denies pain. Psychiatry: Judgement and insight appear normal. Mood & affect flat.     Data Reviewed:   I have personally reviewed following labs and imaging studies   CBC: Recent Labs  Lab 06/11/19 0422 06/12/19 0455 06/13/19 0432  WBC 5.9 6.5 8.1  HGB 8.1* 8.1* 8.4*  HCT 25.5* 25.5* 27.0*  MCV 98.1 97.3 98.9  PLT 182 233 323    Basic Metabolic Panel: Recent Labs  Lab 06/12/19 0455 06/13/19 0432 06/14/19 0511  NA 137 139 142  K 3.5 3.2* 3.2*  CL 102 104 104  CO2 28 27 27   GLUCOSE 96 86 72  BUN 13 13 14   CREATININE 1.21* 1.07* 1.07*  CALCIUM 8.4* 8.4* 8.3*  MG  --  2.0  --     Liver Function Tests: Recent Labs  Lab 06/12/19 0455  AST 28  ALT 22  ALKPHOS 54  BILITOT 0.4  PROT 5.3*  ALBUMIN 1.9*    CBG: No results for input(s): GLUCAP in the last 168 hours.  Microbiology Studies:   Recent Results (from the past 240 hour(s))  Culture, blood (Routine X 2) w Reflex to ID Panel     Status: None   Collection Time: 06/09/19  7:50 AM   Specimen: BLOOD  Result Value Ref Range Status   Specimen Description BLOOD LEFT ANTECUBITAL  Final   Special Requests   Final    BOTTLES DRAWN AEROBIC ONLY Blood Culture results may not be optimal due to an inadequate volume of blood received in culture bottles    Culture   Final    NO GROWTH 5 DAYS Performed at [facility], [place] [address] [place], [place] [address]    Report Status 06/14/2019 FINAL  Final  Culture, blood (Routine X 2) w Reflex to ID Panel     Status: None   Collection Time: 06/09/19  7:55 AM   Specimen: BLOOD LEFT HAND  Result Value Ref Range Status   Specimen Description BLOOD LEFT HAND  Final   Special Requests   Final    BOTTLES DRAWN AEROBIC ONLY Blood Culture results may not be optimal due to an inadequate volume of blood received in culture bottles   Culture   Final    NO GROWTH 5 DAYS Performed at [facility], [place] [address] [place], [place] [address]    Report Status 06/14/2019 FINAL  Final     Radiology Studies:  No results found.   Scheduled Meds:    Chlorhexidine Gluconate  Cloth  6 each Topical Daily   darunavir-cobicistat  1 tablet Oral QHS   dolutegravir  50 mg Oral QHS   And   rilpivirine  25 mg Oral QHS   enoxaparin (LOVENOX) injection  90 mg Subcutaneous Q12H   feeding supplement (ENSURE ENLIVE)  237 mL Oral TID BM   levothyroxine  200 mcg Oral QAC breakfast   maraviroc  150 mg Oral BID   multivitamin with minerals  1 tablet Oral Daily    Continuous Infusions:    sodium chloride 10 mL/hr at 06/10/19 2141   ceFEPime (MAXIPIME) IV 2 g (06/15/19 0601)   vancomycin 1,500 mg (06/15/19 1329)     LOS: 12 days     Vernell Leep, MD, [place], [facility]. [facility]    To contact the attending provider between 7A-7P or the covering provider during after hours 7P-7A, please log into the web site [email or web address] and access using universal [place] password for that web site. If you do not have the password, please call the hospital operator.  06/15/2019, 4:43 PM

## 2019-06-15 NOTE — Progress Notes (Signed)
Daily Progress Note   Patient Name: Kayla Price       Date: 06/15/2019 DOB: Jun 21, 1956  Age: 63 y.o. MRN#: [id number] Attending Physician: Modena Jansky, MD Primary Care Physician: Lauree Chandler, NP Admit Date: 06/01/2019  Reason for Consultation/Follow-up: Establishing goals of care  Subjective: I saw and examined Kayla Price this morning.  She was on HFNC at 7L and reports that she did not require Bipap last night.  We discussed her thoughts on plan moving forward, and she reports wanting Dr. Jana Hakim to discuss with her daughter.  She has know him for some time and values his opinion greatly.  Length of Stay: 12  Current Medications: Scheduled Meds:  . Chlorhexidine Gluconate Cloth  6 each Topical Daily  . darunavir-cobicistat  1 tablet Oral QHS  . dolutegravir  50 mg Oral QHS   And  . rilpivirine  25 mg Oral QHS  . enoxaparin (LOVENOX) injection  90 mg Subcutaneous Q12H  . feeding supplement (ENSURE ENLIVE)  237 mL Oral TID BM  . levothyroxine  200 mcg Oral QAC breakfast  . maraviroc  150 mg Oral BID  . multivitamin with minerals  1 tablet Oral Daily    Continuous Infusions: . sodium chloride 10 mL/hr at 06/10/19 2141  . ceFEPime (MAXIPIME) IV 2 g (06/15/19 0601)  . vancomycin 1,500 mg (06/15/19 1329)    PRN Meds: sodium chloride, acetaminophen **OR** acetaminophen, calcium carbonate (dosed in mg elemental calcium), camphor-menthol **AND** hydrOXYzine, docusate sodium, HYDROcodone-acetaminophen, ondansetron **OR** ondansetron (ZOFRAN) IV, sodium chloride flush, sorbitol, zolpidem  Physical Exam  General: Alert, awake, in no acute distress.  HEENT: No bruits, no goiter, no JVD Heart: Regular rate and rhythm. No murmur appreciated. Lungs: Diminished air  movement, clear Abdomen: Soft, nontender, nondistended, positive bowel sounds.  Ext: No significant edema Skin: Warm and dry Neuro: Grossly intact, nonfocal.         Vital Signs: BP 116/78 (BP Location: Right Arm)   Pulse 83   Temp 98 F (36.7 C) (Axillary)   Resp 17   Ht 5\' 7"  (1.702 m)   Wt 130.6 kg Comment: Likely inacurate. Air Bed  LMP 11/06/2003   SpO2 100%   BMI 45.11 kg/m  SpO2: SpO2: 100 % O2 Device: O2 Device: High Flow Nasal Cannula O2 Flow Rate: O2  Flow Rate (L/min): 12 L/min  Intake/output summary:   Intake/Output Summary (Last 24 hours) at 06/15/2019 1412 Last data filed at 06/15/2019 0522 Gross per 24 hour  Intake 1694.53 ml  Output 550 ml  Net 1144.53 ml   LBM: Last BM Date: 06/14/19 Baseline Weight: Weight: 90.7 kg Most recent weight: Weight: 130.6 kg(Likely inacurate. Air Bed)       Palliative Assessment/Data:      Patient Active Problem List   Diagnosis Date Noted  . Right lower lobe pneumonia 06/09/2019  . Pressure injury of skin 06/09/2019  . Acute hypoxemic respiratory failure ([place]) 06/09/2019  . Right leg DVT ( [place]) 06/09/2019  . Edema of right lower extremity 06/08/2019  . 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 Intertrigo 06/04/2019  . Pancytopenia ([place]) 06/04/2019  . Malnutrition of moderate degree 06/03/2019  . Alteration in skin integrity due to moisture 06/02/2019  . DNR (do not resuscitate) 06/02/2019  . AKI (acute kidney injury) ([place]) 05/28/2019  . Hyperkalemia 05/28/2019  . UTI (urinary tract infection) 05/28/2019  . Hypotension 05/28/2019  . Acute kidney injury superimposed on CKD ([place]) 05/28/2019  . Brain metastasis ([place]) 04/28/2019  . Hypothyroidism 04/28/2019  . Anemia of chronic disease 04/28/2019  . Encounter for antineoplastic chemotherapy 02/06/2019  . Port-A-Cath in place 08/01/2018  . Goals of care, counseling/discussion 06/26/2018  . Recurrent right pleural effusion 05/30/2018  . Malignant  pleural effusion 05/19/2018  . Hilar adenopathy 05/15/2018  . Pleural effusion on right 04/25/2018  . Seasonal allergic rhinitis due to pollen 01/16/2018  . Aortic atherosclerosis ([place]) 11/11/2017  . Morbid obesity with BMI of 40.0-44.9, adult ([place]) 11/11/2017  . Genetic testing 06/27/2017  . Family history of non-Hodgkin's lymphoma   . Family history of lung cancer   . Endometrial thickening on ultrasound 01/25/2017  . Metastatic breast cancer ([place]) 11/05/2016  . Malignant neoplasm of upper-outer quadrant of right breast in female, estrogen receptor negative ([place]) 10/09/2016  . Primary localized osteoarthritis of left knee 02/06/2016  . Primary osteoarthritis of left knee 02/05/2016  . Osteoarthritis of left knee 06/08/2015  . CKD (chronic kidney disease), stage III 06/08/2015  . Vitamin D deficiency 09/23/2014  . Acute renal insufficiency 04/23/2014  . 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 Bell's palsy 12/04/2012  . Contact dermatitis 08/29/2011  . Dry eyes 08/20/2011  . Dry mouth 08/20/2011  . Neuropathy 04/09/2011  . Insomnia 04/09/2011  . Obesities, morbid ([place]) 09/27/2010  . Endometrial polyp 12/22/2009  . Alopecia areata 11/28/2009  . MENORRHAGIA, POSTMENOPAUSAL 02/03/2009  . ABNORMAL GLANDULAR PAPANICOLAOU SMEAR OF CERVIX 11/04/2008  . ALOPECIA 05/06/2008  . Trigger finger, acquired 05/06/2008  . HIP FRACTURE, RIGHT 05/06/2008  . ARTHROSCOPY, LEFT KNEE, HX OF 02/03/2008  . HYPERLIPIDEMIA, MIXED 12/15/2007  . HAND PAIN, RIGHT 12/15/2007  . Other abnormal glucose 12/15/2007  . UNSPECIFIED VITAMIN D DEFICIENCY 08/06/2007  . TINEA CAPITIS 08/04/2007  . CNTC DERMATITIS&OTH ECZEMA DUE OTH CHEM PRODUCTS 08/04/2007  . PVD 04/07/2007  . Secondary localized osteoarthrosis 04/07/2007  . Human immunodeficiency virus (HIV) disease ([place]) 03/27/2006  . HTN (hypertension) 03/27/2006  . BREAST CANCER, HX OF 03/27/2006    Palliative Care Assessment & Plan   Patient  Profile: 63 year old female with past medical history of Breast cancer metastasis to the brain and right pleura status post whole brain radiation, HIV, CKD stage III and recent admission for superficial skin breakdown and ulceration who was admitted with HSV ulcerations and right lower extremity DVT who suffered complication  of worsening acute hypoxic respiratory failure likely secondary to pneumonia versus lymphangitic spread.  She is now BiPAP dependent and desats whenever removing BiPAP.  Palliative consulted for goals of care.  Recommendations/Plan:  DNR  Patient is open to recommendations for care plan moving forward from Dr. Jana Hakim.  Yesterday she was open to consideration for hospice, but her daughter wanted to speak with Dr. Jana Hakim first.  I sent him a message this AM and he will reach out to her daughter to discuss concerns and care plan.  Code Status:    Code Status Orders  (From admission, onward)         Start     Ordered   06/01/19 2307  Do not attempt resuscitation (DNR)  Continuous    Question Answer Comment  In the event of cardiac or respiratory ARREST Do not call a "code blue"   In the event of cardiac or respiratory ARREST Do not perform Intubation, CPR, defibrillation or ACLS   In the event of cardiac or respiratory ARREST Use medication by any route, position, wound care, and other measures to relive pain and suffering. May use oxygen, suction and manual treatment of airway obstruction as needed for comfort.      06/01/19 2307        Code Status History    Date Active Date Inactive Code Status Order ID Comments User Context   05/28/2019 0556 05/31/2019 2235 DNR [id number]  Shela Leff, MD ED   04/28/2019 1745 05/01/2019 1934 Partial Code [id number]  Mckinley Jewel, MD ED   05/30/2018 1729 06/02/2018 2201 Full Code [id number]  Nani Skillern, PA-C Inpatient   11/05/2016 1145 11/06/2016 1713 Full Code [id number]  Fanny Skates, MD Inpatient   02/06/2016  1812 02/08/2016 1859 Full Code [id number]  Leighton Parody, PA-C Inpatient   Advance Care Planning Activity    Advance Directive Documentation     Most Recent Value  Type of Advance Directive  Living will  Pre-existing out of facility DNR order (yellow form or pink MOST form)  --  "MOST" Form in Place?  --       Prognosis:   Poor  Discharge Planning:  To Be Determined  Care plan was discussed with patient  Thank you for allowing the Palliative Medicine Team to assist in the care of this patient.   Total Time 20 Prolonged Time Billed No      Greater than 50%  of this time was spent counseling and coordinating care related to the above assessment and plan.  Micheline Rough, MD  Please contact Palliative Medicine Team phone at [phone] for questions and concerns.

## 2019-06-16 ENCOUNTER — Inpatient Hospital Stay: Payer: Medicare HMO

## 2019-06-16 ENCOUNTER — Telehealth: Payer: Self-pay | Admitting: *Deleted

## 2019-06-16 ENCOUNTER — Inpatient Hospital Stay (HOSPITAL_BASED_OUTPATIENT_CLINIC_OR_DEPARTMENT_OTHER): Payer: Medicare HMO | Admitting: Oncology

## 2019-06-16 DIAGNOSIS — Z171 Estrogen receptor negative status [ER-]: Secondary | ICD-10-CM

## 2019-06-16 DIAGNOSIS — C50411 Malignant neoplasm of upper-outer quadrant of right female breast: Secondary | ICD-10-CM

## 2019-06-16 LAB — CBC
HCT: 26.7 % — ABNORMAL LOW (ref 36.0–46.0)
Hemoglobin: 8.3 g/dL — ABNORMAL LOW (ref 12.0–15.0)
MCH: 31.4 pg (ref 26.0–34.0)
MCHC: 31.1 g/dL (ref 30.0–36.0)
MCV: 101.1 fL — ABNORMAL HIGH (ref 80.0–100.0)
Platelets: 258 10*3/uL (ref 150–400)
RBC: 2.64 MIL/uL — ABNORMAL LOW (ref 3.87–5.11)
RDW: 22.3 % — ABNORMAL HIGH (ref 11.5–15.5)
WBC: 11.3 10*3/uL — ABNORMAL HIGH (ref 4.0–10.5)
nRBC: 0.7 % — ABNORMAL HIGH (ref 0.0–0.2)

## 2019-06-16 LAB — COMPREHENSIVE METABOLIC PANEL
ALT: 21 U/L (ref 0–44)
AST: 29 U/L (ref 15–41)
Albumin: 1.9 g/dL — ABNORMAL LOW (ref 3.5–5.0)
Alkaline Phosphatase: 53 U/L (ref 38–126)
Anion gap: 9 (ref 5–15)
BUN: 17 mg/dL (ref 8–23)
CO2: 28 mmol/L (ref 22–32)
Calcium: 8.8 mg/dL — ABNORMAL LOW (ref 8.9–10.3)
Chloride: 103 mmol/L (ref 98–111)
Creatinine, Ser: 1.12 mg/dL — ABNORMAL HIGH (ref 0.44–1.00)
GFR calc Af Amer: >60 mL/min (ref >60)
GFR calc non Af Amer: 52 mL/min — ABNORMAL LOW (ref >60)
Glucose, Bld: 84 mg/dL (ref 70–99)
Potassium: 4 mmol/L (ref 3.5–5.1)
Sodium: 140 mmol/L (ref 135–145)
Total Bilirubin: 0.5 mg/dL (ref 0.3–1.2)
Total Protein: 5.3 g/dL — ABNORMAL LOW (ref 6.5–8.1)

## 2019-06-16 LAB — MRSA PCR SCREENING: MRSA by PCR: NEGATIVE

## 2019-06-16 LAB — HEPARIN ANTI-XA: Heparin LMW: 2 IU/mL

## 2019-06-16 MED ORDER — ENOXAPARIN SODIUM 80 MG/0.8ML ~~LOC~~ SOLN
80.0000 mg | Freq: Two times a day (BID) | SUBCUTANEOUS | Status: DC
  Administered 2019-06-17 – 2019-06-18 (×3): 80 mg via SUBCUTANEOUS
  Filled 2019-06-16 (×3): qty 0.8

## 2019-06-16 NOTE — [place] FL2 (Signed)
[place] MEDICAID FL2 LEVEL OF CARE SCREENING TOOL     IDENTIFICATION  Patient Name: MAEBEL TEGETHOFF Birthdate: 1955-09-24 Sex: female Admission Date (Current Location): 06/01/2019  [facility] and [place] Number:  Producer, television/film/video and Address:  [facility], [address], [place], [place] [address]      Provider Number: [id number]  Attending Physician Name and Address:  Calvert Cantor, MD  Relative Name and Phone Number:  Mathis Fare [phone]    Current Level of Care: Hospital Recommended Level of Care: Skilled Nursing Facility Prior Approval Number:    Date Approved/Denied:   PASRR Number: [id number]  Discharge Plan: SNF    Current Diagnoses: Patient Active Problem List   Diagnosis Date Noted  . Right lower lobe pneumonia 06/09/2019  . Pressure injury of skin 06/09/2019  . Acute hypoxemic respiratory failure (HCC) 06/09/2019  . Right leg DVT (HCC) 06/09/2019  . Edema of right lower extremity 06/08/2019  . 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 Intertrigo 06/04/2019  . Pancytopenia (HCC) 06/04/2019  . Malnutrition of moderate degree 06/03/2019  . Alteration in skin integrity due to moisture 06/02/2019  . DNR (do not resuscitate) 06/02/2019  . AKI (acute kidney injury) (HCC) 05/28/2019  . Hyperkalemia 05/28/2019  . UTI (urinary tract infection) 05/28/2019  . Hypotension 05/28/2019  . Acute kidney injury superimposed on CKD (HCC) 05/28/2019  . Brain metastasis (HCC) 04/28/2019  . Hypothyroidism 04/28/2019  . Anemia of chronic disease 04/28/2019  . Encounter for antineoplastic chemotherapy 02/06/2019  . Port-A-Cath in place 08/01/2018  . Goals of care, counseling/discussion 06/26/2018  . Recurrent right pleural effusion 05/30/2018  . Malignant pleural effusion 05/19/2018  . Hilar adenopathy 05/15/2018  . Pleural effusion on right 04/25/2018  . Seasonal allergic rhinitis due to pollen 01/16/2018  . Aortic  atherosclerosis (HCC) 11/11/2017  . Morbid obesity with BMI of 40.0-44.9, adult (HCC) 11/11/2017  . Genetic testing 06/27/2017  . Family history of non-Hodgkin's lymphoma   . Family history of lung cancer   . Endometrial thickening on ultrasound 01/25/2017  . Metastatic breast cancer (HCC) 11/05/2016  . Malignant neoplasm of upper-outer quadrant of right breast in female, estrogen receptor negative (HCC) 10/09/2016  . Primary localized osteoarthritis of left knee 02/06/2016  . Primary osteoarthritis of left knee 02/05/2016  . Osteoarthritis of left knee 06/08/2015  . CKD (chronic kidney disease), stage III 06/08/2015  . Vitamin D deficiency 09/23/2014  . Acute renal insufficiency 04/23/2014  . 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 Bell's palsy 12/04/2012  . Contact dermatitis 08/29/2011  . Dry eyes 08/20/2011  . Dry mouth 08/20/2011  . Neuropathy 04/09/2011  . Insomnia 04/09/2011  . Obesities, morbid (HCC) 09/27/2010  . Endometrial polyp 12/22/2009  . Alopecia areata 11/28/2009  . MENORRHAGIA, POSTMENOPAUSAL 02/03/2009  . ABNORMAL GLANDULAR PAPANICOLAOU SMEAR OF CERVIX 11/04/2008  . ALOPECIA 05/06/2008  . Trigger finger, acquired 05/06/2008  . HIP FRACTURE, RIGHT 05/06/2008  . ARTHROSCOPY, LEFT KNEE, HX OF 02/03/2008  . HYPERLIPIDEMIA, MIXED 12/15/2007  . HAND PAIN, RIGHT 12/15/2007  . Other abnormal glucose 12/15/2007  . UNSPECIFIED VITAMIN D DEFICIENCY 08/06/2007  . TINEA CAPITIS 08/04/2007  . CNTC DERMATITIS&OTH ECZEMA DUE OTH CHEM PRODUCTS 08/04/2007  . PVD 04/07/2007  . Secondary localized osteoarthrosis 04/07/2007  . Human immunodeficiency virus (HIV) disease (HCC) 03/27/2006  . HTN (hypertension) 03/27/2006  . BREAST CANCER, HX OF 03/27/2006    Orientation RESPIRATION BLADDER Height & Weight     Time, Self,  Situation, Place  O2(HFNC, 12L) Indwelling catheter, Incontinent(placed 12/19) Weight: 294 lb (133.4 kg) Height:  5\' 7"  (170.2 cm)  BEHAVIORAL  SYMPTOMS/MOOD NEUROLOGICAL BOWEL NUTRITION STATUS      Continent Diet(regular diet)  AMBULATORY STATUS COMMUNICATION OF NEEDS Skin   Extensive Assist Verbally PU Stage and Appropriate Care(Open wound on right and left groin, aquacel dressing)   PU Stage 2 Dressing: (located on buttocks, foam dressing.)                   Personal Care Assistance Level of Assistance  Bathing, Feeding, Dressing Bathing Assistance: Maximum assistance Feeding assistance: Independent Dressing Assistance: Maximum assistance     Functional Limitations Info  Sight, Hearing, Speech Sight Info: Adequate Hearing Info: Adequate Speech Info: Adequate    SPECIAL CARE FACTORS FREQUENCY  PT (By licensed PT), OT (By licensed OT)     PT Frequency: 5x OT Frequency: 5x            Contractures Contractures Info: Not present    Additional Factors Info  Code Status, Allergies Code Status Info: DNR Allergies Info: Lisinopril, Pepcid (Famotidine), Prilosec (Omeprazole), Tums (Calcium Carbonate Antacid)           Current Medications (06/16/2019):  This is the current hospital active medication list Current Facility-Administered Medications  Medication Dose Route Frequency Provider Last Rate Last Admin  . 0.9 %  sodium chloride infusion   Intravenous PRN Elease Etienne, MD 10 mL/hr at 06/10/19 2141 New Bag at 06/10/19 2141  . acetaminophen (TYLENOL) tablet 650 mg  650 mg Oral Q6H PRN Rometta Emery, MD   650 mg at 06/02/19 1142   Or  . acetaminophen (TYLENOL) suppository 650 mg  650 mg Rectal Q6H PRN Earlie Lou L, MD      . calcium carbonate (dosed in mg elemental calcium) suspension 500 mg of elemental calcium  500 mg of elemental calcium Oral Q6H PRN Rometta Emery, MD      . camphor-menthol (SARNA) lotion 1 application  1 application Topical Q8H PRN Rometta Emery, MD       And  . hydrOXYzine (ATARAX/VISTARIL) tablet 25 mg  25 mg Oral Q8H PRN Mikeal Hawthorne, Mohammad L, MD      . ceFEPIme  (MAXIPIME) 2 g in sodium chloride 0.9 % 100 mL IVPB  2 g Intravenous Q8H Elease Etienne, MD 200 mL/hr at 06/16/19 0650 2 g at 06/16/19 0650  . Chlorhexidine Gluconate Cloth 2 % PADS 6 each  6 each Topical Daily Joseph Art, DO   6 each at 06/16/19 0920  . darunavir-cobicistat (PREZCOBIX) 800-150 MG per tablet 1 tablet  1 tablet Oral QHS Marlin Canary U, DO   1 tablet at 06/15/19 2308  . docusate sodium (ENEMEEZ) enema 283 mg  1 enema Rectal PRN Rometta Emery, MD      . dolutegravir (TIVICAY) tablet 50 mg  50 mg Oral QHS Della Goo, RPH   50 mg at 06/15/19 2309   And  . rilpivirine (EDURANT) tablet 25 mg  25 mg Oral QHS Della Goo, [place]   25 mg at 06/15/19 2309  . enoxaparin (LOVENOX) injection 90 mg  90 mg Subcutaneous Q12H Roberto Scales D, MD   90 mg at 06/16/19 0520  . feeding supplement (ENSURE ENLIVE) (ENSURE ENLIVE) liquid 237 mL  237 mL Oral TID BM Zannie Cove, MD   237 mL at 06/15/19 1500  . HYDROcodone-acetaminophen (NORCO/VICODIN) 5-325 MG per tablet 1 tablet  1 tablet Oral Q6H PRN Joseph Art, DO   1 tablet at 06/11/19 0222  . levothyroxine (SYNTHROID) tablet 200 mcg  200 mcg Oral QAC breakfast Marlin Canary U, DO   200 mcg at 06/16/19 2130  . maraviroc (SELZENTRY) tablet 150 mg  150 mg Oral BID Marlin Canary U, DO   150 mg at 06/16/19 8657  . multivitamin with minerals tablet 1 tablet  1 tablet Oral Daily Zannie Cove, MD   1 tablet at 06/16/19 [phone]  . ondansetron (ZOFRAN) tablet 4 mg  4 mg Oral Q6H PRN Rometta Emery, MD       Or  . ondansetron (ZOFRAN) injection 4 mg  4 mg Intravenous Q6H PRN Earlie Lou L, MD      . sodium chloride flush (NS) 0.9 % injection 10-40 mL  10-40 mL Intracatheter PRN Magrinat, Valentino Hue, MD   10 mL at 06/10/19 0406  . sorbitol 70 % solution 30 mL  30 mL Oral PRN Earlie Lou L, MD      . vancomycin (VANCOREADY) IVPB 1500 mg/300 mL  1,500 mg Intravenous Q24H Marcellus Scott D, MD 150 mL/hr at 06/16/19 0944 1,500  mg at 06/16/19 0944  . zolpidem (AMBIEN) tablet 5 mg  5 mg Oral QHS PRN Rometta Emery, MD   5 mg at 06/11/19 2143     Discharge Medications: Please see discharge summary for a list of discharge medications.  Relevant Imaging Results:  Relevant Lab Results:   Additional Information SSN:[id number]  Reuel Boom Caelynn Marshman, LCSW

## 2019-06-16 NOTE — Progress Notes (Signed)
PROGRESS NOTE    Kayla Price   WPY:[id number]  DOB: [birth year]  DOA: 06/01/2019 PCP: Lauree Chandler, NP   Brief Narrative:  Kayla Price 63 year old female with PMH of metastatic breast cancer with metastasis to brain and right pleura, s/p whole brain radiation, HIV, CKD stage II, recently hospitalized 05/27/2019-05/31/2023 acute kidney injury, hypotension and superficial ulcerations of the groin/perineum who presented the next day of discharge 06/01/2019 due to worsening drainage from her known skin ulcerations along with generalized weakness.  She was admitted for hypotension, acute kidney injury and ulcers of her groin and perineum.  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  Hospital course complicated by community-acquired pneumonia, extensive right lower extremity DVT, acute hypoxic respiratory failure.  She was transferred to progressive care unit on 12/17, PCCM consulted, BiPAP initiated, primary oncologist and PMT consulted, since she was doing so poorly, discussions regarding transitioning to comfort care were ongoing.    Subjective: Patient has no complaints of cough or shortness of breath.    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 -This was the reason for her admission -She has completed a course of Valtrex -Continue to follow  Active Problems:     AKI (acute kidney injury)  -CKD 2/3  -Renal ultrasound 12/3 was unrevealing -This is resolved  Hypoxic respiratory failure secondary to community-acquired pneumonia -Patient required treatment with BiPAP starting on 12/18 and has been weaned down now to nasal cannula -The patient has been weaned down to about 6 to 7 L of oxygen-she appears to be comfortable at rest -Continue to wean oxygen as able -She is being treated with IV antibiotics including Vanc and cefepime which we are continuing -Follow-up MRSA PCR  screen and DC vancomycin if negative  HIV -Relatively well controlled with a CD4 count greater than 400 on 10/26 -She will need to follow-up with ID as outpatient in 4 to 6 weeks  Extensive left leg DVT -Has been placed on Lovenox and it is plan for her to continue Lovenox as outpatient  Metastatic cancer-mets to the brain and right pleural space - is status post post whole brain radiation 11/20, chemotherapy and right modified radical mastectomy -There is a plan for repeat PET scan as outpatient-she is followed by Dr. Jana Hakim who has been seeing her in the hospital as well  Time spent in minutes: 35 DVT prophylaxis: Lovenox Code Status: DO NOT RESUSCITATE Family Communication:  Disposition Plan: To be determined-continue to treat pneumonia Consultants:   Oncology  Critical care  Infectious disease  Palliative care Procedures:   BiPAP  Catheter Antimicrobials:  Anti-infectives (From admission, onward)   Start     Dose/Rate Route Frequency Ordered Stop   06/15/19 1000  vancomycin (VANCOREADY) IVPB 1500 mg/300 mL     1,500 mg 150 mL/hr over 120 Minutes Intravenous Every 24 hours 06/14/19 1017     06/14/19 1800  vancomycin (VANCOREADY) IVPB 1500 mg/300 mL  Status:  Discontinued     1,500 mg 150 mL/hr over 120 Minutes Intravenous Every 24 hours 06/14/19 0924 06/14/19 1017   06/14/19 1400  ceFEPIme (MAXIPIME) 2 g in sodium chloride 0.9 % 100 mL IVPB     2 g 200 mL/hr over 30 Minutes Intravenous Every 8 hours 06/14/19 0924     06/13/19 1800  vancomycin (VANCOREADY) IVPB 1250 mg/250 mL  Status:  Discontinued     1,250 mg 166.7 mL/hr over  90 Minutes Intravenous Every 24 hours 06/12/19 1506 06/14/19 0924   06/13/19 1130  ceFEPIme (MAXIPIME) 2 g in sodium chloride 0.9 % 100 mL IVPB  Status:  Discontinued     2 g 200 mL/hr over 30 Minutes Intravenous Every 12 hours 06/13/19 1159 06/14/19 0924   06/12/19 1800  vancomycin (VANCOREADY) IVPB 1750 mg/350 mL     1,750 mg 175 mL/hr  over 120 Minutes Intravenous  Once 06/12/19 1506 06/12/19 1940   06/12/19 1600  ceFEPIme (MAXIPIME) 2 g in sodium chloride 0.9 % 100 mL IVPB  Status:  Discontinued     2 g 200 mL/hr over 30 Minutes Intravenous Every 12 hours 06/12/19 1506 06/13/19 1159   06/10/19 0600  cefTRIAXone (ROCEPHIN) 1 g in sodium chloride 0.9 % 100 mL IVPB  Status:  Discontinued     1 g 200 mL/hr over 30 Minutes Intravenous Every 24 hours 06/09/19 0732 06/12/19 1335   06/09/19 0800  doxycycline (VIBRAMYCIN) 100 mg in sodium chloride 0.9 % 250 mL IVPB  Status:  Discontinued     100 mg 125 mL/hr over 120 Minutes Intravenous Every 12 hours 06/09/19 0735 06/12/19 1335   06/09/19 0745  cefTRIAXone (ROCEPHIN) 1 g in sodium chloride 0.9 % 100 mL IVPB     1 g 200 mL/hr over 30 Minutes Intravenous  Once 06/09/19 0738 06/09/19 0924   06/05/19 2200  valACYclovir (VALTREX) tablet 1,000 mg     1,000 mg Oral 2 times daily 06/05/19 1418 06/12/19 1101   06/05/19 1000  valACYclovir (VALTREX) tablet 1,000 mg  Status:  Discontinued     1,000 mg Oral 3 times daily 06/05/19 0834 06/05/19 1418   06/02/19 2200  darunavir-cobicistat (PREZCOBIX) 800-150 MG per tablet 1 tablet     1 tablet Oral Daily at bedtime 06/02/19 0950     06/02/19 2200  dolutegravir (TIVICAY) tablet 50 mg     50 mg Oral Daily at bedtime 06/02/19 1455     06/02/19 2200  rilpivirine (EDURANT) tablet 25 mg     25 mg Oral Daily at bedtime 06/02/19 1455     06/02/19 1700  dolutegravir (TIVICAY) tablet 50 mg  Status:  Discontinued     50 mg Oral Daily with supper 06/02/19 1453 06/02/19 1456   06/02/19 1700  rilpivirine (EDURANT) tablet 25 mg  Status:  Discontinued     25 mg Oral Daily with supper 06/02/19 1453 06/02/19 1456   06/02/19 1415  fluconazole (DIFLUCAN) IVPB 200 mg  Status:  Discontinued     200 mg 100 mL/hr over 60 Minutes Intravenous Every 24 hours 06/02/19 1402 06/05/19 0834   06/02/19 1100  maraviroc (SELZENTRY) tablet 150 mg     150 mg Oral 2 times  daily 06/02/19 0950     06/02/19 1000  cefdinir (OMNICEF) capsule 300 mg     300 mg Oral 2 times daily 06/02/19 0950 06/04/19 2207   06/02/19 1000  fluconazole (DIFLUCAN) tablet 400 mg  Status:  Discontinued     400 mg Oral Daily 06/02/19 0950 06/02/19 1402       Objective: Vitals:   06/15/19 2013 06/16/19 0500 06/16/19 1426 06/16/19 1500  BP: 103/74 106/76 106/76 93/75  Pulse: 98 92 89   Resp: (!) 21 (!) 24    Temp: 98.2 F (36.8 C) 98.1 F (36.7 C) 98.2 F (36.8 C)   TempSrc: Oral Oral Oral   SpO2: 98% 100% 99%   Weight:  133.4 kg    Height:  Intake/Output Summary (Last 24 hours) at 06/16/2019 1536 Last data filed at 06/16/2019 0500 Gross per 24 hour  Intake 631.7 ml  Output 1500 ml  Net -868.3 ml   Filed Weights   06/14/19 0424 06/15/19 0520 06/16/19 0500  Weight: 128.8 kg 130.6 kg 133.4 kg    Examination: General exam: Appears comfortable  HEENT: PERRLA, oral mucosa moist, no sclera icterus or thrush Respiratory system: Clear to auscultation. Respiratory effort normal. Cardiovascular system: S1 & S2 heard, RRR.   Gastrointestinal system: Abdomen soft, non-tender, nondistended. Normal bowel sounds. Central nervous system: Alert and oriented. No focal neurological deficits. Extremities: No cyanosis, clubbing or edema Skin: No rashes or ulcers Psychiatry:  Mood & affect appropriate.     Data Reviewed: I have personally reviewed following labs and imaging studies  CBC: Recent Labs  Lab 06/10/19 0359 06/11/19 0422 06/12/19 0455 06/13/19 0432 06/16/19 0500  WBC 5.9 5.9 6.5 8.1 11.3*  HGB 8.5* 8.1* 8.1* 8.4* 8.3*  HCT 26.1* 25.5* 25.5* 27.0* 26.7*  MCV 93.9 98.1 97.3 98.9 101.1*  PLT 151 182 233 261 283   Basic Metabolic Panel: Recent Labs  Lab 06/10/19 0359 06/12/19 0455 06/13/19 0432 06/14/19 0511 06/16/19 0500  NA 135 137 139 142 140  K 3.7 3.5 3.2* 3.2* 4.0  CL 100 102 104 104 103  CO2 25 28 27 27 28   GLUCOSE 110* 96 86 72 84  BUN  11 13 13 14 17   CREATININE 1.03* 1.21* 1.07* 1.07* 1.12*  CALCIUM 8.5* 8.4* 8.4* 8.3* 8.8*  MG  --   --  2.0  --   --    GFR: Estimated Creatinine Clearance: 73.3 mL/min (A) (by C-G formula based on SCr of 1.12 mg/dL (H)). Liver Function Tests: Recent Labs  Lab 06/12/19 0455 06/16/19 0500  AST 28 29  ALT 22 21  ALKPHOS 54 53  BILITOT 0.4 0.5  PROT 5.3* 5.3*  ALBUMIN 1.9* 1.9*   No results for input(s): LIPASE, AMYLASE in the last 168 hours. No results for input(s): AMMONIA in the last 168 hours. Coagulation Profile: No results for input(s): INR, PROTIME in the last 168 hours. Cardiac Enzymes: No results for input(s): CKTOTAL, CKMB, CKMBINDEX, TROPONINI in the last 168 hours. BNP (last 3 results) No results for input(s): PROBNP in the last 8760 hours. HbA1C: No results for input(s): HGBA1C in the last 72 hours. CBG: No results for input(s): GLUCAP in the last 168 hours. Lipid Profile: No results for input(s): CHOL, HDL, LDLCALC, TRIG, CHOLHDL, LDLDIRECT in the last 72 hours. Thyroid Function Tests: No results for input(s): TSH, T4TOTAL, FREET4, T3FREE, THYROIDAB in the last 72 hours. Anemia Panel: No results for input(s): VITAMINB12, FOLATE, FERRITIN, TIBC, IRON, RETICCTPCT in the last 72 hours. Urine analysis:    Component Value Date/Time   COLORURINE YELLOW 06/01/2019 2225   APPEARANCEUR HAZY (A) 06/01/2019 2225   LABSPEC 1.023 06/01/2019 2225   PHURINE 6.0 06/01/2019 2225   GLUCOSEU NEGATIVE 06/01/2019 2225   GLUCOSEU NEG mg/dL 07/23/2006 2113   HGBUR NEGATIVE 06/01/2019 [place] 06/01/2019 [place] 06/01/2019 2225   PROTEINUR 30 (A) 06/01/2019 2225   UROBILINOGEN 1 07/23/2006 2113   NITRITE NEGATIVE 06/01/2019 2225   LEUKOCYTESUR NEGATIVE 06/01/2019 2225   Sepsis Labs: @LABRCNTIP (procalcitonin:4,lacticidven:4) ) Recent Results (from the past 240 hour(s))  Culture, blood (Routine X 2) w Reflex to ID Panel     Status: None    Collection Time: 06/09/19  7:50 AM  Specimen: BLOOD  Result Value Ref Range Status   Specimen Description BLOOD LEFT ANTECUBITAL  Final   Special Requests   Final    BOTTLES DRAWN AEROBIC ONLY Blood Culture results may not be optimal due to an inadequate volume of blood received in culture bottles   Culture   Final    NO GROWTH 5 DAYS Performed at [facility], [place] [address], [place], [place] [address]    Report Status 06/14/2019 FINAL  Final  Culture, blood (Routine X 2) w Reflex to ID Panel     Status: None   Collection Time: 06/09/19  7:55 AM   Specimen: BLOOD LEFT HAND  Result Value Ref Range Status   Specimen Description BLOOD LEFT HAND  Final   Special Requests   Final    BOTTLES DRAWN AEROBIC ONLY Blood Culture results may not be optimal due to an inadequate volume of blood received in culture bottles   Culture   Final    NO GROWTH 5 DAYS Performed at [facility], [place] [address] [place], [place] [address]    Report Status 06/14/2019 FINAL  Final         Radiology Studies: No results found.    Scheduled Meds: . Chlorhexidine Gluconate Cloth  6 each Topical Daily  . darunavir-cobicistat  1 tablet Oral QHS  . dolutegravir  50 mg Oral QHS   And  . rilpivirine  25 mg Oral QHS  . enoxaparin (LOVENOX) injection  90 mg Subcutaneous Q12H  . feeding supplement (ENSURE ENLIVE)  237 mL Oral TID BM  . levothyroxine  200 mcg Oral QAC breakfast  . maraviroc  150 mg Oral BID  . multivitamin with minerals  1 tablet Oral Daily   Continuous Infusions: . sodium chloride 10 mL/hr at 06/10/19 2141  . ceFEPime (MAXIPIME) IV 2 g (06/16/19 0650)  . vancomycin 1,500 mg (06/16/19 0944)     LOS: 13 days      Debbe Odea, MD [facility] Pager: [email or web address] Password [facility] 06/16/2019, 3:36 PM

## 2019-06-16 NOTE — Progress Notes (Signed)
ANTICOAGULATION CONSULT NOTE - Initial Consult  Pharmacy Consult for Lovenox Indication: DVT  Allergies  Allergen Reactions  . Lisinopril Anaphylaxis, Swelling and Other (See Comments)    Swelling of tongue and mouth 11/05/16- tolerates Olmesartan  . Pepcid [Famotidine] Other (See Comments)    PPI H2, BLOCKERS LOWER GASTRIC PH WHICH WOULD LEAD TO SUBTHERAPEUTIC RILPIVIRINE LEVELS AND POTENTIAL VIROLOGICAL FAILURE WITH RESISTANCE  . Prilosec [Omeprazole] Other (See Comments)    PPI H2, BLOCKERS LOWER GASTRIC PH WHICH WOULD LEAD TO SUBTHERAPEUTIC RILPIVIRINE LEVELS AND POTENTIAL VIROLOGICAL FAILURE WITH RESISTANCE  . Tums [Calcium Carbonate Antacid] Other (See Comments)    TUMS ANTACIDS CAN LOWER GASTRIC PH WHICH COULD  LEAD TO SUBTHERAPEUTIC RILPIVIRINE LEVELS AND POTENTIAL VIROLOGICAL FAILURE WITH RESISTANCE TUMS CAN BE GIVEN BUT NEED CONSULT WITH ID PHARMACY RE TIMING. I PREFER HER TO AVOID ALL TOGETHER    Patient Measurements: Height: 5\' 7"  (170.2 cm) Weight: 294 lb (133.4 kg) IBW/kg (Calculated) : 61.6  Vital Signs: Temp: 97.7 F (36.5 C) (12/22 2227) Temp Source: Oral (12/22 2227) BP: 99/68 (12/22 2227) Pulse Rate: 97 (12/22 2227)  Labs: Recent Labs    06/14/19 0511 06/16/19 0500 06/16/19 2117  HGB  --  8.3*  --   HCT  --  26.7*  --   PLT  --  258  --   HEPRLOWMOCWT  --   --  2.00  CREATININE 1.07* 1.12*  --     Estimated Creatinine Clearance: 73.3 mL/min (A) (by C-G formula based on SCr of 1.12 mg/dL (H)).   Medical History: Past Medical History:  Diagnosis Date  . AKI (acute kidney injury) ([place]) 06/02/2019  . Alopecia areata 11/28/2009  . Bell's palsy   . Cancer [facility]) 2005   Breast cancer   chemotherapy and radiation  . CKD (chronic kidney disease) stage 3, GFR 30-59 ml/min 06/08/2015  . Dry eye syndrome   . Family history of lung cancer   . Family history of non-Hodgkin's lymphoma   . Fasting hyperglycemia   . Gestational diabetes    2001  . HIP  FRACTURE, RIGHT 05/06/2008  . History of kidney stones   . HIV DISEASE 03/27/2006  . HYPERLIPIDEMIA, MIXED 12/15/2007  . HYPERTENSION 03/27/2006  . 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 MENORRHAGIA, POSTMENOPAUSAL 02/03/2009  . Osteoarthritis of left knee 06/08/2015  . OSTEOARTHROSIS, LOCAL, SCND, UNSPC SITE 04/07/2007  . Personal history of chemotherapy 11/2018  . PVD 04/07/2007  . Tinea capitis   . TRIGGER FINGER 05/06/2008  . Unspecified vitamin D deficiency 08/06/2007    [name] Pharmacy consulted for Lovenox therapy.   Patient has pancytopenia - Hemoglobin remain stable at 8.4, platelets improved. SCr remains stable at 1.07 with estimated CrCl 75 mL/min.  Awaiting dispo and discharge med plan for long term anticoagulation. Could continue Lovenox indefinitely or switch to reduced dose apixaban given DDI with HIV meds.   PM follow up- weights have been inaccurate, patient receiving lovenox 90 mg q 12 hrs.  Checked 4 hr level this evening, above goal at 2.  Goal of Therapy:  Anti-Xa lovenox level 0.6-1.2 drawn 4 hrs after dose. Monitor platelets by anticoagulation protocol: Yes   Plan:  Decrease lovenox to 80 mg subcutaneous q 12 hrs - recheck level at next steady state. Monitor CBC closely with pancytopenia.  Monitor SCr for any need to adjust dose.    Thank  you for allowing pharmacy to participate in this patient's care. [name], [facility] Clinical Pharmacist Phone [phone]  06/16/2019 10:38 PM

## 2019-06-16 NOTE — TOC Initial Note (Signed)
Transition of Care [facility]) - Initial/Assessment Note    Patient Details  Name: Kayla Price MRN: [id number] Date of Birth: [birth year]  [facility] CM/SW Contact:    Eileen Stanford, LCSW Phone Number: 06/16/2019, 1:37 PM  Clinical Narrative:      Pt is alert and oriented. Pt is agreeable to SNF. Pt wants to go to [facility]. CSW will follow up with facility.  High risk readmission screening completed.           Expected Discharge Plan: Skilled Nursing Facility Barriers to Discharge: Continued Medical Work up   Patient Goals and CMS Choice Patient states their goals for this hospitalization and ongoing recovery are:: "to get rehab" CMS Medicare.gov Compare Post Acute Care list provided to:: Patient Choice offered to / list presented to : Patient  Expected Discharge Plan and Services Expected Discharge Plan: [place] In-house Referral: Clinical Social Work   Post Acute Care Choice: [place] Living arrangements for the past 2 months: [place]                                      Prior Living Arrangements/Services Living arrangements for the past 2 months: Single Family Home Lives with:: Self Patient language and need for interpreter reviewed:: Yes Do you feel safe going back to the place where you live?: Yes      Need for Family Participation in Patient Care: Yes (Comment) Care giver support system in place?: Yes (comment)   Criminal Activity/Legal Involvement Pertinent to Current Situation/Hospitalization: No - Comment as needed  Activities of Daily Living Home Assistive Devices/Equipment: Cane (specify quad or straight), Wheelchair, Environmental consultant (specify type) ADL Screening (condition at time of admission) Patient's cognitive ability adequate to safely complete daily activities?: Yes Is the patient deaf or have difficulty hearing?: No Does the patient have difficulty seeing, even when wearing glasses/contacts?:  No Does the patient have difficulty concentrating, remembering, or making decisions?: No Patient able to express need for assistance with ADLs?: Yes Does the patient have difficulty dressing or bathing?: No Independently performs ADLs?: Yes (appropriate for developmental age) Does the patient have difficulty walking or climbing stairs?: Yes Weakness of Legs: Both Weakness of Arms/Hands: None  Permission Sought/Granted Permission sought to share information with : Family Supports Permission granted to share information with : Yes, Verbal Permission Granted  Share Information with NAME: [name]  Permission granted to share info w AGENCY: [place] granted to share info w Relationship: daughter  Permission granted to share info w Contact Information: [phone]  Emotional Assessment Appearance:: Appears stated age Attitude/Demeanor/Rapport: Engaged Affect (typically observed): Accepting, Appropriate, Calm Orientation: : Oriented to Situation, Oriented to  Time, Oriented to Place, Oriented to Self Alcohol / Substance Use: Not Applicable Psych Involvement: No (comment)  Admission diagnosis:  Urinary retention [R33.9] AKI (acute kidney injury) ([place]) [N17.9] Hypotension, unspecified hypotension type [I95.9] Patient Active Problem List   Diagnosis Date Noted  . Right lower lobe pneumonia 06/09/2019  . Pressure injury of skin 06/09/2019  . Acute hypoxemic respiratory failure ([place]) 06/09/2019  . Right leg DVT ([place]) 06/09/2019  . Edema of right lower extremity 06/08/2019  . 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 Intertrigo 06/04/2019  . Pancytopenia ([place]) 06/04/2019  . Malnutrition of moderate degree 06/03/2019  . Alteration in skin integrity due to moisture 06/02/2019  . DNR (do not  resuscitate) 06/02/2019  . AKI (acute kidney injury) ([place]) 05/28/2019  . Hyperkalemia 05/28/2019  . UTI (urinary tract infection) 05/28/2019  . Hypotension 05/28/2019  .  Acute kidney injury superimposed on CKD ([place]) 05/28/2019  . Brain metastasis ([place]) 04/28/2019  . Hypothyroidism 04/28/2019  . Anemia of chronic disease 04/28/2019  . Encounter for antineoplastic chemotherapy 02/06/2019  . Port-A-Cath in place 08/01/2018  . Goals of care, counseling/discussion 06/26/2018  . Recurrent right pleural effusion 05/30/2018  . Malignant pleural effusion 05/19/2018  . Hilar adenopathy 05/15/2018  . Pleural effusion on right 04/25/2018  . Seasonal allergic rhinitis due to pollen 01/16/2018  . Aortic atherosclerosis ([place]) 11/11/2017  . Morbid obesity with BMI of 40.0-44.9, adult ([place]) 11/11/2017  . Genetic testing 06/27/2017  . Family history of non-Hodgkin's lymphoma   . Family history of lung cancer   . Endometrial thickening on ultrasound 01/25/2017  . Metastatic breast cancer ([place]) 11/05/2016  . Malignant neoplasm of upper-outer quadrant of right breast in female, estrogen receptor negative ([place]) 10/09/2016  . Primary localized osteoarthritis of left knee 02/06/2016  . Primary osteoarthritis of left knee 02/05/2016  . Osteoarthritis of left knee 06/08/2015  . CKD (chronic kidney disease), stage III 06/08/2015  . Vitamin D deficiency 09/23/2014  . Acute renal insufficiency 04/23/2014  . 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 Bell's palsy 12/04/2012  . Contact dermatitis 08/29/2011  . Dry eyes 08/20/2011  . Dry mouth 08/20/2011  . Neuropathy 04/09/2011  . Insomnia 04/09/2011  . Obesities, morbid ([place]) 09/27/2010  . Endometrial polyp 12/22/2009  . Alopecia areata 11/28/2009  . MENORRHAGIA, POSTMENOPAUSAL 02/03/2009  . ABNORMAL GLANDULAR PAPANICOLAOU SMEAR OF CERVIX 11/04/2008  . ALOPECIA 05/06/2008  . Trigger finger, acquired 05/06/2008  . HIP FRACTURE, RIGHT 05/06/2008  . ARTHROSCOPY, LEFT KNEE, HX OF 02/03/2008  . HYPERLIPIDEMIA, MIXED 12/15/2007  . HAND PAIN, RIGHT 12/15/2007  . Other abnormal glucose 12/15/2007  . UNSPECIFIED VITAMIN D  DEFICIENCY 08/06/2007  . TINEA CAPITIS 08/04/2007  . CNTC DERMATITIS&OTH ECZEMA DUE OTH CHEM PRODUCTS 08/04/2007  . PVD 04/07/2007  . Secondary localized osteoarthrosis 04/07/2007  . Human immunodeficiency virus (HIV) disease ([place]) 03/27/2006  . HTN (hypertension) 03/27/2006  . BREAST CANCER, HX OF 03/27/2006   PCP:  Lauree Chandler, NP Pharmacy:   [id number] - [place], [place] [address]  [place] [address] Phone: [phone] Fax: [phone]  [place], [place] [address] Phone: [phone] Fax: [phone]     Social Determinants of Health ([place]) Interventions    Readmission Risk Interventions Readmission Risk Prevention Plan 06/16/2019  Transportation Screening Complete  Medication Review ([place]) Complete  PCP or Specialist appointment within 3-5 days of discharge Complete  HRI or Home Care Consult Complete  SW Recovery Care/Counseling Consult Complete  Palliative Care Screening Not [place] Complete  Some recent data might be hidden

## 2019-06-16 NOTE — Progress Notes (Signed)
Physical Therapy Treatment Patient Details Name: Kayla Price MRN: [id number] DOB: 01/25/1956 Today's Date: 06/16/2019    History of Present Illness Kayla Price is a 63 y.o. female with medical history significant of metastatic breast cancer on chemotherapy, HIV disease, hyperlipidemia, chronic kidney disease stage II, hypertension, anemia of chronic disease, protein calorie malnutrition and s/p d/c home after 4 day admissinon for AKI and buttock wound.  She is admitted with continued weakness and with wound that could not be managed at home and urinary retention.    PT Comments    Patient received in bed, pleasant and willing to work with therapy. Continues to require ModA for functional bed mobility, able to maintain midline sitting at EOB and tolerated sitting at EOB for 12-15 minutes with S today. Attempted sit to stand but unable with +1 assist due to gross weakness, patient anxious to attempt transfer due to neuropathy. Otherwise worked on functional exercises at [employer], seated marches, seated hip abduction, bicep curls, and chest presses. She was left in bed with all needs met, bed alarm active this afternoon.      Follow Up Recommendations  SNF     Equipment Recommendations  None recommended by PT    Recommendations for Other Services       Precautions / Restrictions Precautions Precautions: Fall;Other (comment) Precaution Comments: watch HR Restrictions Weight Bearing Restrictions: No    Mobility  Bed Mobility Overal bed mobility: Needs Assistance Bed Mobility: Supine to Sit;Sit to Supine     Supine to sit: Mod assist Sit to supine: Mod assist   General bed mobility comments: modA to bring trunk up to upright at EOB, modA to manage LEs with return to bed; S to maintain upright at EOB  Transfers Overall transfer level: Needs assistance Equipment used: Rolling walker (2 wheeled)             General transfer comment: attempted, patient  with little effort and statess "once the numbness starts in my arm that's pretty much it for me"  Ambulation/Gait                 Stairs             Wheelchair Mobility    Modified Rankin (Stroke Patients Only)       Balance Overall balance assessment: Needs assistance Sitting-balance support: Feet supported Sitting balance-Leahy Scale: Good                                      Cognition Arousal/Alertness: Awake/alert Behavior During Therapy: WFL for tasks assessed/performed Overall Cognitive Status: Within Functional Limits for tasks assessed                                        Exercises      General Comments General comments (skin integrity, edema, etc.): VSS on supplemental O2 per HFNC      Pertinent Vitals/Pain Pain Assessment: Faces Pain Score: 0-No pain Faces Pain Scale: No hurt Pain Intervention(s): Limited activity within patient's tolerance;Monitored during session    Home Living                      Prior Function            PT Goals (current goals can now be  found in the care plan section) Acute Rehab PT Goals Patient Stated Goal: to get stronger PT Goal Formulation: With patient Time For Goal Achievement: 06/30/19 Potential to Achieve Goals: Fair Progress towards PT goals: Progressing toward goals    Frequency    Min 2X/week      PT Plan Current plan remains appropriate    Co-evaluation              AM-PAC PT "6 Clicks" Mobility   Outcome Measure  Help needed turning from your back to your side while in a flat bed without using bedrails?: A Lot Help needed moving from lying on your back to sitting on the side of a flat bed without using bedrails?: A Lot Help needed moving to and from a bed to a chair (including a wheelchair)?: Total Help needed standing up from a chair using your arms (e.g., wheelchair or bedside chair)?: Total Help needed to walk in hospital room?:  Total Help needed climbing 3-5 steps with a railing? : Total 6 Click Score: 8    End of Session Equipment Utilized During Treatment: Gait belt Activity Tolerance: Patient tolerated treatment well Patient left: in bed;with call bell/phone within reach;with bed alarm set   PT Visit Diagnosis: Other abnormalities of gait and mobility (R26.89);Muscle weakness (generalized) (M62.81);Pain Pain - part of body: (buttocks and perineum)     Time: 2878-6767 PT Time Calculation (min) (ACUTE ONLY): 40 min  Charges:  $Therapeutic Exercise: 8-22 mins $Therapeutic Activity: 23-37 mins                     Windell Norfolk, DPT, PN1   Supplemental Physical Therapist [place]    Pager [phone] Acute Rehab Office [phone]

## 2019-06-16 NOTE — Progress Notes (Signed)
Kayla Price is still in the hospital; she is expecting to [facility] next few days. I will try to schedule her PET scan for late DEC or early January and see me after that to discuss further treatment as tolerated.

## 2019-06-16 NOTE — Progress Notes (Addendum)
HEMATOLOGY-ONCOLOGY PROGRESS NOTE  SUBJECTIVE: Kayla Price remains on HFNC at 6 L/min.  No longer using BiPAP.  States that she is tired and wants to rest.  Still has some shortness of breath, but overall improving.  She denies any complaints of discomfort.  Remains afebrile.  Oncology History  Malignant neoplasm of upper-outer quadrant of right breast in female, estrogen receptor negative ([place])  03/2004 Surgery   Right lumpectomy and SLNB for IDC, 0.5cm, 1/2 + SLN, grade 3, ER-, PR-, HER-2 -.  Treated with Doxorubicin and Cyclophosphamide x 4, followed by weekly Paclitaxel x 7 and adjuvant radiation.    10/01/2016 Initial Biopsy   Right breast upper outer quadrant biopsy: IDC, DCIS, triple negative, grade 3. Lymph node: Positive   11/05/2016 Surgery   Right modified radical mastectomy: IDC, grade 3, 2.6 cm, +LVI, triple negative, margins negative, 2/10 LN + metastases.  T2, N1a   11/20/2016 - 03/11/2017 Adjuvant Chemotherapy   Gemcitabine/Carboplatin given on days 1 and 8 on a 21 day cycle x 6 cycles   07/15/2018 - 07/15/2018 Chemotherapy   The patient had pembrolizumab (KEYTRUDA) 200 mg in sodium chloride 0.9 % 50 mL chemo infusion, 200 mg, Intravenous, Once, 0 of 6 cycles  for chemotherapy treatment.    07/15/2018 - 01/01/2019 Chemotherapy   The patient had atezolizumab (TECENTRIQ) 840 mg in sodium chloride 0.9 % 250 mL chemo infusion, 840 mg, Intravenous, Once, 6 of 8 cycles Dose modification: 1,680 mg (original dose 840 mg, Cycle 4, Reason: Provider Judgment), 840 mg (original dose 840 mg, Cycle 4, Reason: Provider Judgment) Administration: 840 mg (07/15/2018), 840 mg (08/01/2018), 840 mg (08/15/2018), 840 mg (08/29/2018), 840 mg (09/12/2018), 840 mg (09/26/2018), 840 mg (10/10/2018), 840 mg (10/24/2018), 1,680 mg (11/07/2018), 1,680 mg (12/05/2018)  for chemotherapy treatment.    12/19/2018 -  Chemotherapy   The patient had pegfilgrastim (NEULASTA) injection 6 mg, 6 mg, Subcutaneous, Once, 1 of 1  cycle Administration: 6 mg (03/28/2019) pegfilgrastim (NEULASTA ONPRO KIT) injection 6 mg, 6 mg, Subcutaneous, Once, 4 of 8 cycles Administration: 6 mg (01/09/2019), 6 mg (01/23/2019), 6 mg (02/06/2019), 6 mg (02/20/2019), 6 mg (04/03/2019), 6 mg (04/17/2019) eriBULin mesylate (HALAVEN) 2.75 mg in sodium chloride 0.9 % 100 mL chemo infusion, 1.2 mg/m2 = 2.75 mg (100 % of original dose 1.2 mg/m2), Intravenous,  Once, 5 of 9 cycles Dose modification: 1.2 mg/m2 (original dose 1.2 mg/m2, Cycle 1, Reason: Provider Judgment) Administration: 2.75 mg (12/19/2018), 2.75 mg (01/09/2019), 2.75 mg (01/23/2019), 2.75 mg (02/06/2019), 2.75 mg (02/20/2019), 2.75 mg (03/06/2019), 2.75 mg (03/25/2019), 2.75 mg (04/03/2019), 2.75 mg (04/17/2019)  for chemotherapy treatment.    01/02/2019 - 01/02/2019 Chemotherapy   The patient had atezolizumab (TECENTRIQ) 1,200 mg in sodium chloride 0.9 % 250 mL chemo infusion, 1,200 mg, Intravenous, Once, 0 of 6 cycles  for chemotherapy treatment.    Metastatic breast cancer ([place])  11/05/2016 Initial Diagnosis   Recurrent cancer of right breast ([place])   07/15/2018 - 01/01/2019 Chemotherapy   The patient had atezolizumab (TECENTRIQ) 840 mg in sodium chloride 0.9 % 250 mL chemo infusion, 840 mg, Intravenous, Once, 5 of 8 cycles Dose modification: 1,680 mg (original dose 840 mg, Cycle 4, Reason: Provider Judgment), 840 mg (original dose 840 mg, Cycle 4, Reason: Provider Judgment) Administration: 840 mg (07/15/2018), 840 mg (08/01/2018), 840 mg (08/15/2018), 840 mg (08/29/2018), 840 mg (09/12/2018), 840 mg (09/26/2018), 840 mg (10/10/2018), 840 mg (10/24/2018), 1,680 mg (11/07/2018)  for chemotherapy treatment.    12/19/2018 -  Chemotherapy  The patient had pegfilgrastim (NEULASTA) injection 6 mg, 6 mg, Subcutaneous, Once, 1 of 1 cycle Administration: 6 mg (03/28/2019) pegfilgrastim (NEULASTA ONPRO KIT) injection 6 mg, 6 mg, Subcutaneous, Once, 4 of 8 cycles Administration: 6 mg (01/09/2019), 6 mg (01/23/2019), 6  mg (02/06/2019), 6 mg (02/20/2019), 6 mg (04/03/2019), 6 mg (04/17/2019) eriBULin mesylate (HALAVEN) 2.75 mg in sodium chloride 0.9 % 100 mL chemo infusion, 1.2 mg/m2 = 2.75 mg (100 % of original dose 1.2 mg/m2), Intravenous,  Once, 5 of 9 cycles Dose modification: 1.2 mg/m2 (original dose 1.2 mg/m2, Cycle 1, Reason: Provider Judgment) Administration: 2.75 mg (12/19/2018), 2.75 mg (01/09/2019), 2.75 mg (01/23/2019), 2.75 mg (02/06/2019), 2.75 mg (02/20/2019), 2.75 mg (03/06/2019), 2.75 mg (03/25/2019), 2.75 mg (04/03/2019), 2.75 mg (04/17/2019)  for chemotherapy treatment.      PHYSICAL EXAMINATION: ECOG PERFORMANCE STATUS: 2 - Symptomatic, <50% confined to bed  Vitals:   06/15/19 2013 06/16/19 0500  BP: 103/74 106/76  Pulse: 98 92  Resp: (!) 21 (!) 24  Temp: 98.2 F (36.8 C) 98.1 F (36.7 C)  SpO2: 98% 100%   Filed Weights   06/14/19 0424 06/15/19 0520 06/16/19 0500  Weight: 284 lb (128.8 kg) 288 lb (130.6 kg) 294 lb (133.4 kg)    Intake/Output from previous day: 12/21 0701 - 12/22 0700 In: 1031.7 [IV Piggyback:1031.7] Out: 1500 [Urine:1500]  GENERAL:alert, appears comfortable LUNGS: Diminished breath sounds in the bilateral bases HEART: regular rate & rhythm and no murmurs and bilateral lower extremity edema, right greater than left ABDOMEN:abdomen soft, non-tender and normal bowel sounds NEURO: Alert, oriented, nonfocal  LABORATORY DATA:  I have reviewed the data as listed CMP Latest Ref Rng & Units 06/16/2019 06/14/2019 06/13/2019  Glucose 70 - 99 mg/dL 84 72 86  BUN 8 - 23 mg/dL _0 Creatinine 0.44 - 1.00 mg/dL 1.12(H) 1.07(H) 1.07(H)  Sodium 135 - 145 mmol/L 140 142 139  Potassium 3.5 - 5.1 mmol/L 4.0 3.2(L) 3.2(L)  Chloride 98 - 111 mmol/L 103 104 104  CO2 22 - 32 mmol/L _1 Calcium 8.9 - 10.3 mg/dL 8.8(L) 8.3(L) 8.4(L)  Total Protein 6.5 - 8.1 g/dL 5.3(L) - -  Total Bilirubin 0.3 - 1.2 mg/dL 0.5 - -  Alkaline Phos 38 - 126 U/L 53 - -  AST 15 - 41 U/L 29 - -   ALT 0 - 44 U/L 21 - -    Lab Results  Component Value Date   WBC 11.3 (H) 06/16/2019   HGB 8.3 (L) 06/16/2019   HCT 26.7 (L) 06/16/2019   MCV 101.1 (H) 06/16/2019   PLT 258 06/16/2019   NEUTROABS 1.8 06/01/2019    CT ANGIO CHEST PE W OR WO CONTRAST  Result Date: 06/09/2019 CLINICAL DATA:  Shortness of breath.  Metastatic breast cancer. EXAM: CT ANGIOGRAPHY CHEST WITH CONTRAST TECHNIQUE: Multidetector CT imaging of the chest was performed using the standard protocol during bolus administration of intravenous contrast. Multiplanar CT image reconstructions and MIPs were obtained to evaluate the vascular anatomy. CONTRAST:  35m OMNIPAQUE IOHEXOL 350 MG/ML SOLN COMPARISON:  PET CT 03/11/2019.  Chest CT 04/22/2018 FINDINGS: Cardiovascular: No filling defects in the pulmonary arteries to suggest pulmonary emboli. Tortuous, ectatic aorta with maximum diameter 3.7 cm. No dissection. Heart is enlarged. Mediastinum/Nodes: Extensive adenopathy in the mediastinum. Surgical clips in the right axilla. This is unchanged since recent PET CT. Lungs/Pleura: Extensive pleural nodularity/disease throughout right hemithorax, similar to recent PET CT. Right middle lobe mass is unchanged. Central necrotic  portion again noted, unchanged. Scattered ground-glass airspace disease noted within both lungs concerning for pneumonia. Upper Abdomen: Gastrohepatic ligament lymph nodes again noted, unchanged. No acute findings. Musculoskeletal: Prior right mastectomy. Right chest wall Port-A-Cath remains in place, unchanged. Sclerotic lesion within the T11 vertebral body is stable. Review of the MIP images confirms the above findings. IMPRESSION: No evidence of pulmonary embolus. Extensive mediastinal adenopathy, right pleural metastatic disease, and right middle lobe mass are all stable since prior PET CT. Worsening ground-glass airspace disease throughout both lungs concerning for pneumonia. Cardiomegaly. Tortuous, ectatic  thoracic aorta. Electronically Signed   By: Rolm Baptise M.D.   On: 06/09/2019 17:44   US RENAL  Result Date: 05/28/2019 CLINICAL DATA:  Acute kidney injury. EXAM: RENAL / URINARY TRACT ULTRASOUND COMPLETE COMPARISON:  PET CT scan 03/11/2019. FINDINGS: Right Kidney: Renal measurements: 9.9 x 4.3 x 5.0 cm = volume: 109.7 mL . Echogenicity within normal limits. No mass or hydronephrosis visualized. Left Kidney: Renal measurements: 9.6 x 5.4 x 4.9 cm = volume: Is 134.3. mL. Echogenicity within normal limits. No mass or hydronephrosis visualized. Bladder: Appears normal for degree of bladder distention. Other: Small right pleural effusion and gallstones are noted as seen on the prior exam. IMPRESSION: Negative for hydronephrosis.  Normal appearing kidneys. Right pleural effusion. Gallstones. Electronically Signed   By: Inge Rise M.D.   On: 05/28/2019 07:15   DG CHEST PORT 1 VIEW  Result Date: 06/13/2019 CLINICAL DATA:  Hypoxia. EXAM: PORTABLE CHEST 1 VIEW COMPARISON:  June 12, 2019 FINDINGS: The right Port-A-Cath is stable. No pneumothorax. Infiltrate in the left mid lung is more focal in the interval. Infiltrate in the right lung, particularly in the lower right lung is similar in the interval. The cardiomediastinal silhouette is stable. No other interval changes. Stable cardiomediastinal silhouette. IMPRESSION: 1. Stable infiltrate on the right, particularly in the right base. More focal infiltrate in the left mid lung compared to the previous study. Stable right Port-A-Cath. Electronically Signed   By: Dorise Bullion III M.D   On: 06/13/2019 15:45   DG Chest Port 1 View  Result Date: 06/12/2019 CLINICAL DATA:  Hypoxia. Metastatic breast cancer. EXAM: PORTABLE CHEST 1 VIEW COMPARISON:  CTA chest 06/09/2019. One-view chest x-ray 06/08/2019. FINDINGS: The heart size is normal. The heart is enlarged. Right IJ Port-A-Cath is stable. A right pleural effusion has slightly increased. Right greater  than left airspace opacities have progressed. Surgical clips are noted in the right axilla. IMPRESSION: 1. Progressive right greater than left airspace disease compatible with pneumonia. 2. Slight increase in right pleural effusion. 3. Stable right IJ Port-A-Cath. Electronically Signed   By: San Morelle M.D.   On: 06/12/2019 09:29   DG CHEST PORT 1 VIEW  Result Date: 06/08/2019 CLINICAL DATA:  Hypoxia EXAM: PORTABLE CHEST 1 VIEW COMPARISON:  07/10/2018 FINDINGS: Right Port-A-Cath in place with the tip in the SVC. Airspace disease in the right lower lung concerning for pneumonia. No confluent opacity on the left. Heart is borderline in size. Possible small right effusion. No acute bony abnormality. IMPRESSION: Right lower lung airspace disease concerning for pneumonia. Small right effusion suspected. Electronically Signed   By: Rolm Baptise M.D.   On: 06/08/2019 18:55   ECHOCARDIOGRAM COMPLETE  Result Date: 06/12/2019   ECHOCARDIOGRAM REPORT   Patient Name:   Kayla Price Date of Exam: 06/12/2019 Medical Rec #:  [id number]         Height:       67.0 in Accession #:  [id number]        Weight:       198.0 lb Date of Birth:  03-Aug-1955         BSA:          2.01 m Patient Age:    63 years          BP:           128/76 mmHg Patient Gender: F                 HR:           108 bpm. Exam Location:  Inpatient Procedure: 2D Echo, Color Doppler and Cardiac Doppler Indications:    R06.9 DOE  History:        Patient has no prior history of Echocardiogram examinations.                 Risk Factors:Hypertension and Dyslipidemia.  Sonographer:    Raquel Sarna Senior RDCS Referring Phys: [place]  1. Left ventricular ejection fraction, by visual estimation, is 65 to 70%. The left ventricle has normal function. There is no left ventricular hypertrophy.  2. The left ventricle has no regional wall motion abnormalities.  3. Global right ventricle has normal systolic function.The right ventricular  size is mildly enlarged. No increase in right ventricular wall thickness.  4. Left atrial size was normal.  5. Right atrial size was severely dilated.  6. The mitral valve is normal in structure. Mild mitral valve regurgitation. No evidence of mitral stenosis.  7. The tricuspid valve is normal in structure. Tricuspid valve regurgitation is mild.  8. The aortic valve is tricuspid. Aortic valve regurgitation is not visualized. No evidence of aortic valve sclerosis or stenosis.  9. There is severe calcifcation of the aortic valve. 10. The pulmonic valve was normal in structure. Pulmonic valve regurgitation is trivial. 11. Severely elevated pulmonary artery systolic pressure at 12WPYK. 12. The inferior vena cava is normal in size with greater than 50% respiratory variability, suggesting right atrial pressure of 3 mmHg. 13. The interatrial septum appears to be lipomatous. FINDINGS  Left Ventricle: Left ventricular ejection fraction, by visual estimation, is 65 to 70%. The left ventricle has normal function. The left ventricle has no regional wall motion abnormalities. There is no left ventricular hypertrophy. Normal left atrial pressure. Right Ventricle: The right ventricular size is mildly enlarged. No increase in right ventricular wall thickness. Global RV systolic function is has normal systolic function. The tricuspid regurgitant velocity is 4.00 m/s, and with an assumed right atrial  pressure of 8 mmHg, the estimated right ventricular systolic pressure is severely elevated at 72.0 mmHg. Left Atrium: Left atrial size was normal in size. Right Atrium: Right atrial size was severely dilated Pericardium: There is no evidence of pericardial effusion. Mitral Valve: The mitral valve is normal in structure. Mild mitral valve regurgitation. No evidence of mitral valve stenosis by observation. Tricuspid Valve: The tricuspid valve is normal in structure. Tricuspid valve regurgitation is mild. Aortic Valve: The aortic valve is  tricuspid. . There is moderate thickening and severe calcifcation of the aortic valve. Aortic valve regurgitation is not visualized. The aortic valve is structurally normal, with no evidence of sclerosis or stenosis. There is moderate thickening of the aortic valve. There is severe calcifcation of the aortic valve. Pulmonic Valve: The pulmonic valve was normal in structure. Pulmonic valve regurgitation is trivial. Pulmonic regurgitation is trivial. Aorta: The aortic root, ascending aorta and aortic arch are  all structurally normal, with no evidence of dilitation or obstruction. Venous: The inferior vena cava is normal in size with greater than 50% respiratory variability, suggesting right atrial pressure of 3 mmHg. IAS/Shunts: Increased thickness of the atrial septum sparing the fossa ovalis consistent with The interatrial septum appears to be lipomatous. No atrial level shunt detected by color flow Doppler. There is no evidence of a patent foramen ovale. No ventricular septal defect is seen or detected. There is no evidence of an atrial septal defect.  LEFT VENTRICLE PLAX 2D LVIDd:         3.75 cm LVIDs:         1.82 cm LV PW:         1.05 cm LV IVS:        0.94 cm LVOT diam:     2.10 cm LV SV:         50 ml LV SV Index:   23.95 LVOT Area:     3.46 cm  RIGHT VENTRICLE             IVC RV Basal diam:  3.23 cm     IVC diam: 1.44 cm RV Mid diam:    4.81 cm RV Length:      6.81 cm RV S prime:     21.10 cm/s TAPSE (M-mode): 1.8 cm LEFT ATRIUM           Index LA diam:      3.10 cm 1.54 cm/m LA Vol (A2C): 34.1 ml 16.94 ml/m LA Vol (A4C): 46.1 ml 22.90 ml/m  AORTIC VALVE LVOT Vmax:   98.80 cm/s LVOT Vmean:  74.000 cm/s LVOT VTI:    0.192 m  AORTA Ao Root diam: 3.10 cm Ao Asc diam:  3.60 cm TRICUSPID VALVE TR Peak grad:   64.0 mmHg TR Vmax:        400.00 cm/s  SHUNTS Systemic VTI:  0.19 m Systemic Diam: 2.10 cm  Fransico Him MD Electronically signed by Fransico Him MD Signature Date/Time: 06/12/2019/3:15:41 PM    Final     VAS [place] LOWER EXTREMITY VENOUS (DVT)  Result Date: 06/09/2019  Lower Venous Study Indications: Swelling.  Risk Factors: Cancer History of metastatic breast cancer. Comparison Study: No prior study on file Performing Technologist: Sharion Dove RVS  Examination Guidelines: A complete evaluation includes B-mode imaging, spectral Doppler, color Doppler, and power Doppler as needed of all accessible portions of each vessel. Bilateral testing is considered an integral part of a complete examination. Limited examinations for reoccurring indications may be performed as noted.  +---------+---------------+---------+-----------+----------+--------------+ RIGHT    CompressibilityPhasicitySpontaneityPropertiesThrombus Aging +---------+---------------+---------+-----------+----------+--------------+ CFV      Partial        Yes      Yes                  Acute          +---------+---------------+---------+-----------+----------+--------------+ SFJ      Full                                                        +---------+---------------+---------+-----------+----------+--------------+ FV Prox  None  Acute          +---------+---------------+---------+-----------+----------+--------------+ FV Mid   None                                         Acute          +---------+---------------+---------+-----------+----------+--------------+ FV DistalNone                                         Acute          +---------+---------------+---------+-----------+----------+--------------+ PFV      Full                                                        +---------+---------------+---------+-----------+----------+--------------+ POP      None           No       No                   Acute          +---------+---------------+---------+-----------+----------+--------------+ PTV      None                                         Acute           +---------+---------------+---------+-----------+----------+--------------+ PERO     None                                         Acute          +---------+---------------+---------+-----------+----------+--------------+ EIV                     Yes      Yes                                 +---------+---------------+---------+-----------+----------+--------------+   +----+---------------+---------+-----------+----------+--------------+ LEFTCompressibilityPhasicitySpontaneityPropertiesThrombus Aging +----+---------------+---------+-----------+----------+--------------+ CFV Full           Yes      Yes                                 +----+---------------+---------+-----------+----------+--------------+     Summary: Right: Findings consistent with acute deep vein thrombosis involving the right common femoral vein, right femoral vein, right popliteal vein, right posterior tibial veins, and right peroneal veins. Left: No evidence of common femoral vein obstruction.  *See table(s) above for measurements and observations. Electronically signed by Harold Barban MD on 06/09/2019 at 8:27:06 AM.    Final     ASSESSMENT: 63 y.o. [place] woman    (1) status post right lumpectomy and sentinel lymph node sampling October 2005 for a 0.6 cm invasive ductal carcinoma involving one out of 2 sentinel lymph nodes sampled, grade 3, triple-negative, treated adjuvantly with doxorubicin and cyclophosphamide 4 followed by weekly paclitaxel 7, followed by adjuvant radiation   RECURRENT DISEASE: (2)  status post right breast upper outer quadrant biopsy and right axillary lymph node biopsy 10/01/2016, both positive for a T2 N1, stage IIIB invasive ductal carcinoma, triple negative, with an MIB-1 of 50-70%   (3) status post right modified radical mastectomy 11/05/2016 showing a pT2 pN1, stage IIIB invasive ductal carcinoma, grade 3, triple negative, with negative margins   (4) not a candidate for  radiation given prior history   (5) adjuvant chemotherapy consisting of carboplatin and gemcitabine given days 1 and 8 of each 21 day cycle, for 6 cycles, starting 11/20/2016, completed 03/11/2017             (a) day 8 cycle 2 omitted because of neutropenia; Neupogen/Neulasta added   (5) HIV positivity: under care of [facility] Luc[facility] [place])   (6) Genetic testing 06/17/2017:  no pathogenic mutations. Genes tested: APC, ATM, AXIN2, BARD1, BLM, BMPR1A, BRCA1, BRCA2, BRIP1, CDH1, CDK4, CDKN2A (p14ARF), CDKN2A (p16INK4a), CEBPA, CHEK2, CTNNA1, DICER1, EPCAM*, GATA2, GREM1*, HRAS, KIT, MEN1, MLH1, MSH2, MSH3, MSH6, MUTYH, NBN, NF1, PALB2, PDGFRA, PMS2, POLD1, POLE, PTEN, RAD50, RAD51C, RAD51D, RUNX1, SDHB, SDHC, SDHD, SMAD4, SMARCA4, STK11, TERC, TERT, TP53, TSC1, TSC2, VHL. The following genes were evaluated for sequence changes only: HOXB13*, NTHL1*, SDHA.              (a) A variant of uncertain significance (VUS) in a gene called NTHL1 was also noted. c.736G>A (p.[id number])   METASTATIC DISEASE: November 2019 (1) Patient seen in urgent care and ultimately ED on 04/14/2018 for shortness of breath, chest xray demonstrated Right pleural effusion.   (a) right thoracenteses on 10/21 and 11/4 results show atypical cells, non diagnostic (b) CT chest 04/22/2018 shows re-accumulation of fluid, and right pleural nodularity. (c) PET scan on 05/01/2018 shows hypermetabolic pleural based metastases, right CP angle nodal metastases, no evidence of malignancy in abdomen and pelvis. (d) bronchoscopy with biopsy by Dr. Lamonte Sakai and BAL on 05/15/2018 was non diagnostic             (e) VAT biopsy of the right pleura 05/30/2018 confirms carcinoma, triple negative             (f) [facility] shows PD-L1 positive (1% in [facility]); otherwise microsatellite stable, TMB low (5/Mb), no PIK3 mutations; other mutations suggest sensitivity to MTOR inhibitors and several TKIs   (2) Atezolizumab started 07/15/2018 given every other week              (a) PET 08/13/2018 shows tumor Right hemithorax (new baseline study)             (b) Atezo changed to Q4w starting with 11/07/2018 dose             (c) PET 11/28/2018 documents progression in the right lung and pleural area as well as lymph nodes, and a T1 skeletal metastasis             (d) atezolizumab discontinued after 12/05/2018 dose   (3) eribulin day 1 and 8 of every 21 day cycle started 12/19/2018             (a) changed to every other week with Neulsta support due to neutropenia causing treatment delays and her h/o HIV (delays due to insurance denial of onpro after 02/20/2019 dose)             (b) PET scan on 03/11/2019 showed stable to improved measurable disease, with multiple new bone lesions             (c) zoledronate given  every 12 weeks starting on 03/25/2019   (4) brain MRI 04/28/2019 documents multiple intracranial metastases             (a) whole brain radiation 04/30/2019 - 05/13/2019, 30 Gy in 10 fractions  (5) right lower extremity DVT diagnosed December 2020     PLAN:  Kayla Price remains on HFNC with decreasing O2 requirements.  Her shortness of breath seems to be improving.  She remains on IV antibiotics.  She is awaiting SNF placement when medically stable.  We will plan to schedule Kayla Price back for a follow-up visit following hospital discharge.  It is not clear if she will be able to tolerate any additional systemic therapy, but we will reevaluate that her next visit.   LOS: 13 days   Mikey Bussing, DNP, AGPCNP-BC, AOCNP 06/16/19   ADDENDUM: Kayla Price's breathing is pretty much back to baseline. She has not needed BiPap fopr >2 days. I think at this point going back to the original plan--SNF, outpatient PET scan , and discussion of r4sults in the cancer Clinic--is warranted.  I have requested her PET for 06/24/2019 but do not know if she will be able to undergo it then as this is a difficult season for Radiology.She will see me Jan 5 to discuss  results.  Speaking with he daughter yesterday it struck me she did not understand her mother's condition well. It seems likely Kayla Price, who is very competent and independent, may not have told her all that is going on. I did let the daughter know that though the brain metastases had been irradiated they could recur and it was not clear if she could receive further  CNS irradiation treatments.  I greatly appreciate your help to this patient!  I personally saw this patient and performed a substantive portion of this encounter with the listed APP documented above.   Chauncey Cruel, MD Medical Oncology and Hematology [facility] [address] [place], [place] [address] Tel. [phone]    Fax. [phone]

## 2019-06-16 NOTE — Progress Notes (Signed)
Nutrition Follow-up  DOCUMENTATION CODES:   Non-severe (moderate) malnutrition in context of chronic illness  INTERVENTION:   -Ensure Enlive po TID, each supplement provides 350 kcal and 20 grams of protein -Multivitamin with minerals daily  NUTRITION DIAGNOSIS:   Moderate Malnutrition related to chronic illness(metastatic breast cancer) as evidenced by mild fat depletion, severe muscle depletion, mild muscle depletion, percent weight loss.  Ongoing.  GOAL:   Patient will meet greater than or equal to 90% of their needs  Progressing.  MONITOR:   PO intake, Supplement acceptance, Labs, Weight trends  REASON FOR ASSESSMENT:   Consult Assessment of nutrition requirement/status  ASSESSMENT:   63 year old patient admitted with weakness and wound that could not be managed at home. PMH of metastatic breast cancer on chemo, HIV, HLD, HTN, AKI, anemia of chronic disease and CKD stage 3. Pt found to have moisture associated skin breakdown.  Patient documented as drinking ~1 Ensure daily.  PO documentation: 0% of meals Per oncology note, plan is for pt to discharge to SNF with Palliative care follow-up given poor prognosis.  Admission weight: 90.7 kg  Current weight: 133.4 kg   I/O: +517 ml since admit  UOP: 795 ml x 24 hrs   Medications: MVI with minerals Labs reviewed.  Diet Order:   Diet Order            Diet regular Room service appropriate? Yes; Fluid consistency: Thin  Diet effective now              EDUCATION NEEDS:   Education needs have been addressed  Skin:  Skin Assessment: Skin Integrity Issues: Skin Integrity Issues:: Other (Comment), Stage II Stage II: buttocks x2 Other: non-pressure wound- R/L groin  Last BM:  12/21  Height:   Ht Readings from Last 1 Encounters:  06/01/19 5\' 7"  (1.702 m)    Weight:   Wt Readings from Last 1 Encounters:  06/16/19 133.4 kg    Ideal Body Weight:  61.4 kg  BMI:  Body mass index is 46.05  kg/m.  Estimated Nutritional Needs:   Kcal:  2000-2200  Protein:  100-110  Fluid:  > 2L  Clayton Bibles, MS, RD, LDN Inpatient Clinical Dietitian Pager: [phone] After Hours Pager: [phone]

## 2019-06-16 NOTE — Telephone Encounter (Signed)
CALLED PATIENT TO ALTER FU ON 06-25-19 FROM 4:45 PM TO 9:00 AM, PER JILL, RN FOR DR. KINARD, LVM FOR A RETURN CALL

## 2019-06-17 ENCOUNTER — Other Ambulatory Visit: Payer: Self-pay

## 2019-06-17 LAB — CBC
HCT: 27.5 % — ABNORMAL LOW (ref 36.0–46.0)
HCT: 27.6 % — ABNORMAL LOW (ref 36.0–46.0)
Hemoglobin: 8.7 g/dL — ABNORMAL LOW (ref 12.0–15.0)
Hemoglobin: 8.8 g/dL — ABNORMAL LOW (ref 12.0–15.0)
MCH: 31.9 pg (ref 26.0–34.0)
MCH: 31.7 pg (ref 26.0–34.0)
MCHC: 31.6 g/dL (ref 30.0–36.0)
MCHC: 31.9 g/dL (ref 30.0–36.0)
MCV: 100.7 fL — ABNORMAL HIGH (ref 80.0–100.0)
MCV: 99.3 fL (ref 80.0–100.0)
Platelets: 252 10*3/uL (ref 150–400)
Platelets: 258 10*3/uL (ref 150–400)
RBC: 2.73 MIL/uL — ABNORMAL LOW (ref 3.87–5.11)
RBC: 2.78 MIL/uL — ABNORMAL LOW (ref 3.87–5.11)
RDW: 22.6 % — ABNORMAL HIGH (ref 11.5–15.5)
RDW: 22.6 % — ABNORMAL HIGH (ref 11.5–15.5)
WBC: 12.7 10*3/uL — ABNORMAL HIGH (ref 4.0–10.5)
WBC: 15.1 10*3/uL — ABNORMAL HIGH (ref 4.0–10.5)
nRBC: 0.7 % — ABNORMAL HIGH (ref 0.0–0.2)
nRBC: 0.7 % — ABNORMAL HIGH (ref 0.0–0.2)

## 2019-06-17 LAB — BASIC METABOLIC PANEL
Anion gap: 10 (ref 5–15)
BUN: 21 mg/dL (ref 8–23)
CO2: 27 mmol/L (ref 22–32)
Calcium: 9.5 mg/dL (ref 8.9–10.3)
Chloride: 105 mmol/L (ref 98–111)
Creatinine, Ser: 1.11 mg/dL — ABNORMAL HIGH (ref 0.44–1.00)
GFR calc Af Amer: >60 mL/min (ref >60)
GFR calc non Af Amer: 53 mL/min — ABNORMAL LOW (ref >60)
Glucose, Bld: 100 mg/dL — ABNORMAL HIGH (ref 70–99)
Potassium: 3.5 mmol/L (ref 3.5–5.1)
Sodium: 142 mmol/L (ref 135–145)

## 2019-06-17 LAB — GLUCOSE, CAPILLARY: Glucose-Capillary: 99 mg/dL (ref 70–99)

## 2019-06-17 MED ORDER — DEXTROSE 50 % IV SOLN: 12.5000 g | INTRAVENOUS | Status: AC

## 2019-06-17 MED ORDER — DEXTROSE 50 % IV SOLN
INTRAVENOUS | Status: AC
  Filled 2019-06-17: qty 50

## 2019-06-17 NOTE — Progress Notes (Signed)
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  She was admitted for hypotension, acute kidney injury and ulcers of her groin and perineum.  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  Hospital course complicated by community-acquired pneumonia, extensive right lower extremity DVT, acute hypoxic respiratory failure.  She was transferred to progressive care unit on 12/17, PCCM consulted, BiPAP initiated, primary oncologist and PMT consulted, since she was doing so poorly, discussions regarding transitioning to comfort care were ongoing.    Subjective: The patient has no complaints today.  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 -This was the reason for her admission -She has completed a course of Valtrex -Continue to follow  Active Problems:     AKI (acute kidney injury)  -CKD 2/3  -Renal ultrasound 12/3 was unrevealing -This is resolved  Hypoxic respiratory failure secondary to community-acquired pneumonia -Patient required treatment with BiPAP starting on 12/18 and has been weaned down now to nasal cannula - she appears to be comfortable at rest - Continue to wean oxygen as able - She is being treated with IV antibiotics including Vanc and cefepime which we are continuing - Follow-up MRSA PCR screen and DC vancomycin if negative  HIV -Relatively well controlled with a CD4  count greater than 400 on 10/26 -She will need to follow-up with ID as outpatient in 4 to 6 weeks  Extensive left leg DVT -Has been placed on Lovenox and it is plan for her to continue Lovenox as outpatient  Metastatic cancer-mets to the brain and right pleural space - is status post post whole brain radiation 11/20, chemotherapy and right modified radical mastectomy -There is a plan for repeat PET scan as outpatient-she is followed by Dr. Jana Hakim who has been seeing her in the hospital as well  Time spent in minutes: 35 DVT prophylaxis: Lovenox Code Status: DO NOT RESUSCITATE Family Communication:  Disposition Plan: To be determined-continue to treat pneumonia Consultants:   Oncology  Critical care  Infectious disease  Palliative care Procedures:   BiPAP  Catheter Antimicrobials:  Anti-infectives (From admission, onward)   Start     Dose/Rate Route Frequency Ordered Stop   06/15/19 1000  vancomycin (VANCOREADY) IVPB 1500 mg/300 mL     1,500 mg 150 mL/hr over 120 Minutes Intravenous Every 24 hours 06/14/19 1017     06/14/19 1800  vancomycin (VANCOREADY) IVPB 1500 mg/300 mL  Status:  Discontinued     1,500 mg 150 mL/hr over 120 Minutes Intravenous Every 24 hours 06/14/19 0924 06/14/19 1017   06/14/19 1400  ceFEPIme (MAXIPIME) 2 g in sodium chloride 0.9 % 100 mL IVPB     2 g 200 mL/hr over 30 Minutes Intravenous Every 8 hours 06/14/19 0924     06/13/19 1800  vancomycin (VANCOREADY) IVPB 1250 mg/250 mL  Status:  Discontinued     1,250 mg 166.7 mL/hr over 90 Minutes Intravenous Every 24 hours 06/12/19 1506 06/14/19 0924   06/13/19 1130  ceFEPIme (MAXIPIME) 2 g in sodium chloride 0.9 % 100 mL IVPB  Status:  Discontinued     2 g 200 mL/hr over 30 Minutes Intravenous Every 12 hours 06/13/19 1159 06/14/19 0924   06/12/19 1800  vancomycin (VANCOREADY) IVPB 1750 mg/350 mL     1,750 mg 175 mL/hr over 120 Minutes Intravenous  Once 06/12/19 1506 06/12/19 1940   06/12/19 1600   ceFEPIme (MAXIPIME) 2 g in sodium chloride 0.9 % 100 mL IVPB  Status:  Discontinued     2 g 200 mL/hr over 30 Minutes Intravenous Every 12 hours 06/12/19 1506 06/13/19 1159   06/10/19 0600  cefTRIAXone (ROCEPHIN) 1 g in sodium chloride 0.9 % 100 mL IVPB  Status:  Discontinued     1 g 200 mL/hr over 30 Minutes Intravenous Every 24 hours 06/09/19 0732 06/12/19 1335   06/09/19 0800  doxycycline (VIBRAMYCIN) 100 mg in sodium chloride 0.9 % 250 mL IVPB  Status:  Discontinued     100 mg 125 mL/hr over 120 Minutes Intravenous Every 12 hours 06/09/19 0735 06/12/19 1335   06/09/19 0745  cefTRIAXone (ROCEPHIN) 1 g in sodium chloride 0.9 % 100 mL IVPB     1 g 200 mL/hr over 30 Minutes Intravenous  Once 06/09/19 0738 06/09/19 0924   06/05/19 2200  valACYclovir (VALTREX) tablet 1,000 mg     1,000 mg Oral 2 times daily 06/05/19 1418 06/12/19 1101   06/05/19 1000  valACYclovir (VALTREX) tablet 1,000 mg  Status:  Discontinued     1,000 mg Oral 3 times daily 06/05/19 0834 06/05/19 1418   06/02/19 2200  darunavir-cobicistat (PREZCOBIX) 800-150 MG per tablet 1 tablet     1 tablet Oral Daily at bedtime 06/02/19 0950     06/02/19 2200  dolutegravir (TIVICAY) tablet 50 mg     50 mg Oral Daily at bedtime 06/02/19 1455     06/02/19 2200  rilpivirine (EDURANT) tablet 25 mg     25 mg Oral Daily at bedtime 06/02/19 1455     06/02/19 1700  dolutegravir (TIVICAY) tablet 50 mg  Status:  Discontinued     50 mg Oral Daily with supper 06/02/19 1453 06/02/19 1456   06/02/19 1700  rilpivirine (EDURANT) tablet 25 mg  Status:  Discontinued     25 mg Oral Daily with supper 06/02/19 1453 06/02/19 1456   06/02/19 1415  fluconazole (DIFLUCAN) IVPB 200 mg  Status:  Discontinued     200 mg 100 mL/hr over 60 Minutes Intravenous Every 24 hours 06/02/19 1402 06/05/19 0834   06/02/19 1100  maraviroc (SELZENTRY) tablet 150 mg     150 mg Oral 2 times daily 06/02/19 0950     06/02/19 1000  cefdinir (OMNICEF) capsule 300 mg     300 mg  Oral 2 times daily 06/02/19 0950 06/04/19 2207   06/02/19 1000  fluconazole (DIFLUCAN) tablet 400 mg  Status:  Discontinued     400 mg Oral Daily 06/02/19 0950 06/02/19 1402       Objective: Vitals:   06/16/19 2100 06/16/19 2227 06/17/19 0508 06/17/19 0906  BP: 98/79 99/68 97/73  99/80  Pulse: 90 97 90 87  Resp: (!) 21 15 20  (!) 22  Temp:  97.7 F (36.5 C) 98.4 F (36.9 C)   TempSrc:  Oral Oral   SpO2: 98% 95% 100% 96%  Weight:      Height:        Intake/Output Summary (Last 24 hours) at 06/17/2019 1122 Last data filed at  06/17/2019 0500 Gross per 24 hour  Intake 100 ml  Output 600 ml  Net -500 ml   Filed Weights   06/14/19 0424 06/15/19 0520 06/16/19 0500  Weight: 128.8 kg 130.6 kg 133.4 kg    Examination: General exam: Appears comfortable  HEENT: PERRLA, oral mucosa moist, no sclera icterus or thrush Respiratory system: Clear to auscultation. Respiratory effort normal. Cardiovascular system: S1 & S2 heard,  No murmurs  Gastrointestinal system: Abdomen soft, non-tender, nondistended. Normal bowel sounds   Central nervous system: Alert and oriented. No focal neurological deficits. Extremities: No cyanosis, clubbing or edema Skin: No rashes or ulcers Psychiatry:  Mood & affect appropriate.     Data Reviewed: I have personally reviewed following labs and imaging studies  CBC: Recent Labs  Lab 06/11/19 0422 06/12/19 0455 06/13/19 0432 06/16/19 0500 06/17/19 0500  WBC 5.9 6.5 8.1 11.3* 12.7*  HGB 8.1* 8.1* 8.4* 8.3* 8.7*  HCT 25.5* 25.5* 27.0* 26.7* 27.5*  MCV 98.1 97.3 98.9 101.1* 100.7*  PLT 182 233 261 258 254   Basic Metabolic Panel: Recent Labs  Lab 06/12/19 0455 06/13/19 0432 06/14/19 0511 06/16/19 0500  NA 137 139 142 140  K 3.5 3.2* 3.2* 4.0  CL 102 104 104 103  CO2 28 27 27 28   GLUCOSE 96 86 72 84  BUN 13 13 14 17   CREATININE 1.21* 1.07* 1.07* 1.12*  CALCIUM 8.4* 8.4* 8.3* 8.8*  MG  --  2.0  --   --    GFR: Estimated Creatinine  Clearance: 73.3 mL/min (A) (by C-G formula based on SCr of 1.12 mg/dL (H)). Liver Function Tests: Recent Labs  Lab 06/12/19 0455 06/16/19 0500  AST 28 29  ALT 22 21  ALKPHOS 54 53  BILITOT 0.4 0.5  PROT 5.3* 5.3*  ALBUMIN 1.9* 1.9*   No results for input(s): LIPASE, AMYLASE in the last 168 hours. No results for input(s): AMMONIA in the last 168 hours. Coagulation Profile: No results for input(s): INR, PROTIME in the last 168 hours. Cardiac Enzymes: No results for input(s): CKTOTAL, CKMB, CKMBINDEX, TROPONINI in the last 168 hours. BNP (last 3 results) No results for input(s): PROBNP in the last 8760 hours. HbA1C: No results for input(s): HGBA1C in the last 72 hours. CBG: No results for input(s): GLUCAP in the last 168 hours. Lipid Profile: No results for input(s): CHOL, HDL, LDLCALC, TRIG, CHOLHDL, LDLDIRECT in the last 72 hours. Thyroid Function Tests: No results for input(s): TSH, T4TOTAL, FREET4, T3FREE, THYROIDAB in the last 72 hours. Anemia Panel: No results for input(s): VITAMINB12, FOLATE, FERRITIN, TIBC, IRON, RETICCTPCT in the last 72 hours. Urine analysis:    Component Value Date/Time   COLORURINE YELLOW 06/01/2019 2225   APPEARANCEUR HAZY (A) 06/01/2019 2225   LABSPEC 1.023 06/01/2019 2225   PHURINE 6.0 06/01/2019 2225   GLUCOSEU NEGATIVE 06/01/2019 2225   GLUCOSEU NEG mg/dL 07/23/2006 2113   HGBUR NEGATIVE 06/01/2019 [place] 06/01/2019 [place] 06/01/2019 2225   PROTEINUR 30 (A) 06/01/2019 2225   UROBILINOGEN 1 07/23/2006 2113   NITRITE NEGATIVE 06/01/2019 2225   LEUKOCYTESUR NEGATIVE 06/01/2019 2225   Sepsis Labs: @LABRCNTIP (procalcitonin:4,lacticidven:4) ) Recent Results (from the past 240 hour(s))  Culture, blood (Routine X 2) w Reflex to ID Panel     Status: None   Collection Time: 06/09/19  7:50 AM   Specimen: BLOOD  Result Value Ref Range Status   Specimen Description BLOOD LEFT ANTECUBITAL  Final    Special Requests  Final    BOTTLES DRAWN AEROBIC ONLY Blood Culture results may not be optimal due to an inadequate volume of blood received in culture bottles   Culture   Final    NO GROWTH 5 DAYS Performed at [facility], [place] [address], [place], [place] [address]    Report Status 06/14/2019 FINAL  Final  Culture, blood (Routine X 2) w Reflex to ID Panel     Status: None   Collection Time: 06/09/19  7:55 AM   Specimen: BLOOD LEFT HAND  Result Value Ref Range Status   Specimen Description BLOOD LEFT HAND  Final   Special Requests   Final    BOTTLES DRAWN AEROBIC ONLY Blood Culture results may not be optimal due to an inadequate volume of blood received in culture bottles   Culture   Final    NO GROWTH 5 DAYS Performed at [facility], [place] [address], [place], [place] [address]    Report Status 06/14/2019 FINAL  Final  MRSA PCR Screening     Status: None   Collection Time: 06/16/19  5:30 PM   Specimen: Nasal Mucosa; Nasopharyngeal  Result Value Ref Range Status   MRSA by PCR NEGATIVE NEGATIVE Final    Comment:        The GeneXpert MRSA Assay (FDA approved for NASAL specimens only), is one component of a comprehensive MRSA colonization surveillance program. It is not intended to diagnose MRSA infection nor to guide or monitor treatment for MRSA infections. Performed at [facility], [place] [address], [place], [place] [address]          Radiology Studies: No results found.    Scheduled Meds: . Chlorhexidine Gluconate Cloth  6 each Topical Daily  . darunavir-cobicistat  1 tablet Oral QHS  . dextrose  12.5 g Intravenous STAT  . dextrose      . dolutegravir  50 mg Oral QHS   And  . rilpivirine  25 mg Oral QHS  . enoxaparin (LOVENOX) injection  80 mg Subcutaneous Q12H  . feeding supplement (ENSURE ENLIVE)  237 mL Oral TID BM  . levothyroxine  200 mcg Oral QAC breakfast  . maraviroc  150 mg Oral BID  . multivitamin with minerals  1 tablet Oral  Daily   Continuous Infusions: . sodium chloride 10 mL/hr at 06/10/19 2141  . ceFEPime (MAXIPIME) IV 2 g (06/17/19 6468)  . vancomycin 1,500 mg (06/17/19 1003)     LOS: 14 days      Debbe Odea, MD [facility] Pager: [email or web address] Password [email or web address] 06/17/2019, 11:22 AM

## 2019-06-17 NOTE — TOC Benefit Eligibility Note (Signed)
Transition of Care [facility]) Benefit Eligibility Note    Patient Details  Name: Kayla Price MRN: [id number] Date of Birth: 05/06/1956   Medication/Dose: Lovenox 35m subcutaneous q12h  Covered?: No  Tier: Other(4)  Prescription Coverage Preferred Pharmacy: Any retail Pharmacy  Spoke with Person/Company/Phone Number:: [employer] / [phone] Co-Pay: There is a  quanity limit 22 syringes for 28 days 26. 26 is the copay  Prior Approval: No  Deductible: Met  Additional Notes: There is a quanity limit for this drug if more is needed  the doctor would need to call [phone] prior approval /30 day supply mail order for 28.90 with prior approval    IOrbie PyoPhone Number: 06/17/2019, 2:25 PM

## 2019-06-17 NOTE — Progress Notes (Signed)
Pharmacy Antibiotic Note  Kayla Price is a 63 y.o. female admitted on 06/01/2019 with weakness and worsening ulcer.  Pt was started on Rocephin + Doxy for PNA.  Today is abx d#6 and pt continues to have significant SOB.  Pharmacy has been consulted for  Vancomycin and Cefepime.   -MRSA PCR- negative; noted plans to stop vancomycin if negative -WBC= 12.7, afebrile, SCr= 1.1   Plan: -Continue cefepime 2gm IV q8h (consider discontinuing on 12/24 for a total of 7 days) -Consider stopping vancomycin    Height: 5\' 7"  (170.2 cm) Weight: 294 lb (133.4 kg) IBW/kg (Calculated) : 61.6  Temp (24hrs), Avg:98.1 F (36.7 C), Min:97.7 F (36.5 C), Max:98.4 F (36.9 C)  Recent Labs  Lab 06/11/19 0422 06/12/19 0455 06/13/19 0432 06/14/19 0511 06/16/19 0500 06/17/19 0500  WBC 5.9 6.5 8.1  --  11.3* 12.7*  CREATININE  --  1.21* 1.07* 1.07* 1.12*  --     Estimated Creatinine Clearance: 73.3 mL/min (A) (by C-G formula based on SCr of 1.12 mg/dL (H)).    Allergies  Allergen Reactions  . Lisinopril Anaphylaxis, Swelling and Other (See Comments)    Swelling of tongue and mouth 11/05/16- tolerates Olmesartan  . Pepcid [Famotidine] Other (See Comments)    PPI H2, BLOCKERS LOWER GASTRIC PH WHICH WOULD LEAD TO SUBTHERAPEUTIC RILPIVIRINE LEVELS AND POTENTIAL VIROLOGICAL FAILURE WITH RESISTANCE  . Prilosec [Omeprazole] Other (See Comments)    PPI H2, BLOCKERS LOWER GASTRIC PH WHICH WOULD LEAD TO SUBTHERAPEUTIC RILPIVIRINE LEVELS AND POTENTIAL VIROLOGICAL FAILURE WITH RESISTANCE  . Tums [Calcium Carbonate Antacid] Other (See Comments)    TUMS ANTACIDS CAN LOWER GASTRIC PH WHICH COULD  LEAD TO SUBTHERAPEUTIC RILPIVIRINE LEVELS AND POTENTIAL VIROLOGICAL FAILURE WITH RESISTANCE TUMS CAN BE GIVEN BUT NEED CONSULT WITH ID PHARMACY RE TIMING. I PREFER HER TO AVOID ALL TOGETHER    Antimicrobials this admission: Rocephin 12/15 >> 12/18 Doxy 12/15 >> 12/18 Valacyclovir 12/11 >> 12/18 Vancomycin 12/18  >> Cefepime 12/18 >>  Hildred Laser, PharmD Clinical Pharmacist **Pharmacist phone directory can now be found on [email or web address] ([email or web address]).  Listed under [place].

## 2019-06-17 NOTE — Progress Notes (Addendum)
HEMATOLOGY-ONCOLOGY PROGRESS NOTE  SUBJECTIVE: Kayla Price remains on O2 but is now down to 4 L/min.  She states that she work with physical therapy yesterday.  Still having some shortness of breath.  States that she is awaiting placement at [facility].  She is unsure of her date of discharge.  Oncology History  Malignant neoplasm of upper-outer quadrant of right breast in female, estrogen receptor negative ([place])  03/2004 Surgery   Right lumpectomy and SLNB for IDC, 0.5cm, 1/2 + SLN, grade 3, ER-, PR-, HER-2 -.  Treated with Doxorubicin and Cyclophosphamide x 4, followed by weekly Paclitaxel x 7 and adjuvant radiation.    10/01/2016 Initial Biopsy   Right breast upper outer quadrant biopsy: IDC, DCIS, triple negative, grade 3. Lymph node: Positive   11/05/2016 Surgery   Right modified radical mastectomy: IDC, grade 3, 2.6 cm, +LVI, triple negative, margins negative, 2/10 LN + metastases.  T2, N1a   11/20/2016 - 03/11/2017 Adjuvant Chemotherapy   Gemcitabine/Carboplatin given on days 1 and 8 on a 21 day cycle x 6 cycles   07/15/2018 - 07/15/2018 Chemotherapy   The patient had pembrolizumab (KEYTRUDA) 200 mg in sodium chloride 0.9 % 50 mL chemo infusion, 200 mg, Intravenous, Once, 0 of 6 cycles  for chemotherapy treatment.    07/15/2018 - 01/01/2019 Chemotherapy   The patient had atezolizumab (TECENTRIQ) 840 mg in sodium chloride 0.9 % 250 mL chemo infusion, 840 mg, Intravenous, Once, 6 of 8 cycles Dose modification: 1,680 mg (original dose 840 mg, Cycle 4, Reason: Provider Judgment), 840 mg (original dose 840 mg, Cycle 4, Reason: Provider Judgment) Administration: 840 mg (07/15/2018), 840 mg (08/01/2018), 840 mg (08/15/2018), 840 mg (08/29/2018), 840 mg (09/12/2018), 840 mg (09/26/2018), 840 mg (10/10/2018), 840 mg (10/24/2018), 1,680 mg (11/07/2018), 1,680 mg (12/05/2018)  for chemotherapy treatment.    12/19/2018 -  Chemotherapy   The patient had pegfilgrastim (NEULASTA) injection 6 mg, 6 mg, Subcutaneous,  Once, 1 of 1 cycle Administration: 6 mg (03/28/2019) pegfilgrastim (NEULASTA ONPRO KIT) injection 6 mg, 6 mg, Subcutaneous, Once, 4 of 8 cycles Administration: 6 mg (01/09/2019), 6 mg (01/23/2019), 6 mg (02/06/2019), 6 mg (02/20/2019), 6 mg (04/03/2019), 6 mg (04/17/2019) eriBULin mesylate (HALAVEN) 2.75 mg in sodium chloride 0.9 % 100 mL chemo infusion, 1.2 mg/m2 = 2.75 mg (100 % of original dose 1.2 mg/m2), Intravenous,  Once, 5 of 9 cycles Dose modification: 1.2 mg/m2 (original dose 1.2 mg/m2, Cycle 1, Reason: Provider Judgment) Administration: 2.75 mg (12/19/2018), 2.75 mg (01/09/2019), 2.75 mg (01/23/2019), 2.75 mg (02/06/2019), 2.75 mg (02/20/2019), 2.75 mg (03/06/2019), 2.75 mg (03/25/2019), 2.75 mg (04/03/2019), 2.75 mg (04/17/2019)  for chemotherapy treatment.    01/02/2019 - 01/02/2019 Chemotherapy   The patient had atezolizumab (TECENTRIQ) 1,200 mg in sodium chloride 0.9 % 250 mL chemo infusion, 1,200 mg, Intravenous, Once, 0 of 6 cycles  for chemotherapy treatment.    Metastatic breast cancer (Kayla Price)  11/05/2016 Initial Diagnosis   Recurrent cancer of right breast ([name])   07/15/2018 - 01/01/2019 Chemotherapy   The patient had atezolizumab (TECENTRIQ) 840 mg in sodium chloride 0.9 % 250 mL chemo infusion, 840 mg, Intravenous, Once, 5 of 8 cycles Dose modification: 1,680 mg (original dose 840 mg, Cycle 4, Reason: Provider Judgment), 840 mg (original dose 840 mg, Cycle 4, Reason: Provider Judgment) Administration: 840 mg (07/15/2018), 840 mg (08/01/2018), 840 mg (08/15/2018), 840 mg (08/29/2018), 840 mg (09/12/2018), 840 mg (09/26/2018), 840 mg (10/10/2018), 840 mg (10/24/2018), 1,680 mg (11/07/2018)  for chemotherapy treatment.    12/19/2018 -  Chemotherapy   The patient had pegfilgrastim (NEULASTA) injection 6 mg, 6 mg, Subcutaneous, Once, 1 of 1 cycle Administration: 6 mg (03/28/2019) pegfilgrastim (NEULASTA ONPRO KIT) injection 6 mg, 6 mg, Subcutaneous, Once, 4 of 8 cycles Administration: 6 mg (01/09/2019), 6 mg  (01/23/2019), 6 mg (02/06/2019), 6 mg (02/20/2019), 6 mg (04/03/2019), 6 mg (04/17/2019) eriBULin mesylate (HALAVEN) 2.75 mg in sodium chloride 0.9 % 100 mL chemo infusion, 1.2 mg/m2 = 2.75 mg (100 % of original dose 1.2 mg/m2), Intravenous,  Once, 5 of 9 cycles Dose modification: 1.2 mg/m2 (original dose 1.2 mg/m2, Cycle 1, Reason: Provider Judgment) Administration: 2.75 mg (12/19/2018), 2.75 mg (01/09/2019), 2.75 mg (01/23/2019), 2.75 mg (02/06/2019), 2.75 mg (02/20/2019), 2.75 mg (03/06/2019), 2.75 mg (03/25/2019), 2.75 mg (04/03/2019), 2.75 mg (04/17/2019)  for chemotherapy treatment.      PHYSICAL EXAMINATION: ECOG PERFORMANCE STATUS: 2 - Symptomatic, <50% confined to bed  Vitals:   06/17/19 0508 06/17/19 0906  BP: 97/73 99/80  Pulse: 90 87  Resp: 20 (!) 22  Temp: 98.4 F (36.9 C)   SpO2: 100% 96%   Filed Weights   06/14/19 0424 06/15/19 0520 06/16/19 0500  Weight: 284 lb (128.8 kg) 288 lb (130.6 kg) 294 lb (133.4 kg)    Intake/Output from previous day: 12/22 0701 - 12/23 0700 In: 400 [IV Piggyback:400] Out: 11 [Urine:600]  GENERAL:alert, appears comfortable LUNGS: Diminished breath sounds in the bilateral bases HEART: regular rate & rhythm and no murmurs and bilateral lower extremity edema, right greater than left ABDOMEN:abdomen soft, non-tender and normal bowel sounds NEURO: Alert, oriented, nonfocal  LABORATORY DATA:  I have reviewed the data as listed CMP Latest Ref Rng & Units 06/16/2019 06/14/2019 06/13/2019  Glucose 70 - 99 mg/dL 84 72 86  BUN 8 - 23 mg/dL _0 Creatinine 0.44 - 1.00 mg/dL 1.12(H) 1.07(H) 1.07(H)  Sodium 135 - 145 mmol/L 140 142 139  Potassium 3.5 - 5.1 mmol/L 4.0 3.2(L) 3.2(L)  Chloride 98 - 111 mmol/L 103 104 104  CO2 22 - 32 mmol/L _1 Calcium 8.9 - 10.3 mg/dL 8.8(L) 8.3(L) 8.4(L)  Total Protein 6.5 - 8.1 g/dL 5.3(L) - -  Total Bilirubin 0.3 - 1.2 mg/dL 0.5 - -  Alkaline Phos 38 - 126 U/L 53 - -  AST 15 - 41 U/L 29 - -  ALT 0 - 44  U/L 21 - -    Lab Results  Component Value Date   WBC 12.7 (H) 06/17/2019   HGB 8.7 (L) 06/17/2019   HCT 27.5 (L) 06/17/2019   MCV 100.7 (H) 06/17/2019   PLT 252 06/17/2019   NEUTROABS 1.8 06/01/2019    CT ANGIO CHEST PE W OR WO CONTRAST  Result Date: 06/09/2019 CLINICAL DATA:  Shortness of breath.  Metastatic breast cancer. EXAM: CT ANGIOGRAPHY CHEST WITH CONTRAST TECHNIQUE: Multidetector CT imaging of the chest was performed using the standard protocol during bolus administration of intravenous contrast. Multiplanar CT image reconstructions and MIPs were obtained to evaluate the vascular anatomy. CONTRAST:  74m OMNIPAQUE IOHEXOL 350 MG/ML SOLN COMPARISON:  PET CT 03/11/2019.  Chest CT 04/22/2018 FINDINGS: Cardiovascular: No filling defects in the pulmonary arteries to suggest pulmonary emboli. Tortuous, ectatic aorta with maximum diameter 3.7 cm. No dissection. Heart is enlarged. Mediastinum/Nodes: Extensive adenopathy in the mediastinum. Surgical clips in the right axilla. This is unchanged since recent PET CT. Lungs/Pleura: Extensive pleural nodularity/disease throughout right hemithorax, similar to recent PET CT. Right middle lobe mass is unchanged. Central necrotic portion  again noted, unchanged. Scattered ground-glass airspace disease noted within both lungs concerning for pneumonia. Upper Abdomen: Gastrohepatic ligament lymph nodes again noted, unchanged. No acute findings. Musculoskeletal: Prior right mastectomy. Right chest wall Port-A-Cath remains in place, unchanged. Sclerotic lesion within the T11 vertebral body is stable. Review of the MIP images confirms the above findings. IMPRESSION: No evidence of pulmonary embolus. Extensive mediastinal adenopathy, right pleural metastatic disease, and right middle lobe mass are all stable since prior PET CT. Worsening ground-glass airspace disease throughout both lungs concerning for pneumonia. Cardiomegaly. Tortuous, ectatic thoracic aorta.  Electronically Signed   By: Rolm Baptise M.D.   On: 06/09/2019 17:44   US RENAL  Result Date: 05/28/2019 CLINICAL DATA:  Acute kidney injury. EXAM: RENAL / URINARY TRACT ULTRASOUND COMPLETE COMPARISON:  PET CT scan 03/11/2019. FINDINGS: Right Kidney: Renal measurements: 9.9 x 4.3 x 5.0 cm = volume: 109.7 mL . Echogenicity within normal limits. No mass or hydronephrosis visualized. Left Kidney: Renal measurements: 9.6 x 5.4 x 4.9 cm = volume: Is 134.3. mL. Echogenicity within normal limits. No mass or hydronephrosis visualized. Bladder: Appears normal for degree of bladder distention. Other: Small right pleural effusion and gallstones are noted as seen on the prior exam. IMPRESSION: Negative for hydronephrosis.  Normal appearing kidneys. Right pleural effusion. Gallstones. Electronically Signed   By: Inge Rise M.D.   On: 05/28/2019 07:15   DG CHEST PORT 1 VIEW  Result Date: 06/13/2019 CLINICAL DATA:  Hypoxia. EXAM: PORTABLE CHEST 1 VIEW COMPARISON:  June 12, 2019 FINDINGS: The right Port-A-Cath is stable. No pneumothorax. Infiltrate in the left mid lung is more focal in the interval. Infiltrate in the right lung, particularly in the lower right lung is similar in the interval. The cardiomediastinal silhouette is stable. No other interval changes. Stable cardiomediastinal silhouette. IMPRESSION: 1. Stable infiltrate on the right, particularly in the right base. More focal infiltrate in the left mid lung compared to the previous study. Stable right Port-A-Cath. Electronically Signed   By: Dorise Bullion III M.D   On: 06/13/2019 15:45   DG Chest Port 1 View  Result Date: 06/12/2019 CLINICAL DATA:  Hypoxia. Metastatic breast cancer. EXAM: PORTABLE CHEST 1 VIEW COMPARISON:  CTA chest 06/09/2019. One-view chest x-ray 06/08/2019. FINDINGS: The heart size is normal. The heart is enlarged. Right IJ Port-A-Cath is stable. A right pleural effusion has slightly increased. Right greater than left  airspace opacities have progressed. Surgical clips are noted in the right axilla. IMPRESSION: 1. Progressive right greater than left airspace disease compatible with pneumonia. 2. Slight increase in right pleural effusion. 3. Stable right IJ Port-A-Cath. Electronically Signed   By: San Morelle M.D.   On: 06/12/2019 09:29   DG CHEST PORT 1 VIEW  Result Date: 06/08/2019 CLINICAL DATA:  Hypoxia EXAM: PORTABLE CHEST 1 VIEW COMPARISON:  07/10/2018 FINDINGS: Right Port-A-Cath in place with the tip in the SVC. Airspace disease in the right lower lung concerning for pneumonia. No confluent opacity on the left. Heart is borderline in size. Possible small right effusion. No acute bony abnormality. IMPRESSION: Right lower lung airspace disease concerning for pneumonia. Small right effusion suspected. Electronically Signed   By: Rolm Baptise M.D.   On: 06/08/2019 18:55   ECHOCARDIOGRAM COMPLETE  Result Date: 06/12/2019   ECHOCARDIOGRAM REPORT   Patient Name:   Kayla Price Date of Exam: 06/12/2019 Medical Rec #:  [id number]         Height:       67.0 in Accession #:  [id number]        Weight:       198.0 lb Date of Birth:  [birth year]         BSA:          2.01 m Patient Age:    81 years          BP:           128/76 mmHg Patient Gender: F                 HR:           108 bpm. Exam Location:  Inpatient Procedure: 2D Echo, Color Doppler and Cardiac Doppler Indications:    R06.9 DOE  History:        Patient has no prior history of Echocardiogram examinations.                 Risk Factors:Hypertension and Dyslipidemia.  Sonographer:    Raquel Sarna Senior RDCS Referring Phys: [place]  1. Left ventricular ejection fraction, by visual estimation, is 65 to 70%. The left ventricle has normal function. There is no left ventricular hypertrophy.  2. The left ventricle has no regional wall motion abnormalities.  3. Global right ventricle has normal systolic function.The right ventricular size is  mildly enlarged. No increase in right ventricular wall thickness.  4. Left atrial size was normal.  5. Right atrial size was severely dilated.  6. The mitral valve is normal in structure. Mild mitral valve regurgitation. No evidence of mitral stenosis.  7. The tricuspid valve is normal in structure. Tricuspid valve regurgitation is mild.  8. The aortic valve is tricuspid. Aortic valve regurgitation is not visualized. No evidence of aortic valve sclerosis or stenosis.  9. There is severe calcifcation of the aortic valve. 10. The pulmonic valve was normal in structure. Pulmonic valve regurgitation is trivial. 11. Severely elevated pulmonary artery systolic pressure at [id number]. 12. The inferior vena cava is normal in size with greater than 50% respiratory variability, suggesting right atrial pressure of 3 mmHg. 13. The interatrial septum appears to be lipomatous. FINDINGS  Left Ventricle: Left ventricular ejection fraction, by visual estimation, is 65 to 70%. The left ventricle has normal function. The left ventricle has no regional wall motion abnormalities. There is no left ventricular hypertrophy. Normal left atrial pressure. Right Ventricle: The right ventricular size is mildly enlarged. No increase in right ventricular wall thickness. Global RV systolic function is has normal systolic function. The tricuspid regurgitant velocity is 4.00 m/s, and with an assumed right atrial  pressure of 8 mmHg, the estimated right ventricular systolic pressure is severely elevated at 72.0 mmHg. Left Atrium: Left atrial size was normal in size. Right Atrium: Right atrial size was severely dilated Pericardium: There is no evidence of pericardial effusion. Mitral Valve: The mitral valve is normal in structure. Mild mitral valve regurgitation. No evidence of mitral valve stenosis by observation. Tricuspid Valve: The tricuspid valve is normal in structure. Tricuspid valve regurgitation is mild. Aortic Valve: The aortic valve is  tricuspid. . There is moderate thickening and severe calcifcation of the aortic valve. Aortic valve regurgitation is not visualized. The aortic valve is structurally normal, with no evidence of sclerosis or stenosis. There is moderate thickening of the aortic valve. There is severe calcifcation of the aortic valve. Pulmonic Valve: The pulmonic valve was normal in structure. Pulmonic valve regurgitation is trivial. Pulmonic regurgitation is trivial. Aorta: The aortic root, ascending aorta and aortic arch are  all structurally normal, with no evidence of dilitation or obstruction. Venous: The inferior vena cava is normal in size with greater than 50% respiratory variability, suggesting right atrial pressure of 3 mmHg. IAS/Shunts: Increased thickness of the atrial septum sparing the fossa ovalis consistent with The interatrial septum appears to be lipomatous. No atrial level shunt detected by color flow Doppler. There is no evidence of a patent foramen ovale. No ventricular septal defect is seen or detected. There is no evidence of an atrial septal defect.  LEFT VENTRICLE PLAX 2D LVIDd:         3.75 cm LVIDs:         1.82 cm LV PW:         1.05 cm LV IVS:        0.94 cm LVOT diam:     2.10 cm LV SV:         50 ml LV SV Index:   23.95 LVOT Area:     3.46 cm  RIGHT VENTRICLE             IVC RV Basal diam:  3.23 cm     IVC diam: 1.44 cm RV Mid diam:    4.81 cm RV Length:      6.81 cm RV S prime:     21.10 cm/s TAPSE (M-mode): 1.8 cm LEFT ATRIUM           Index LA diam:      3.10 cm 1.54 cm/m LA Vol (A2C): 34.1 ml 16.94 ml/m LA Vol (A4C): 46.1 ml 22.90 ml/m  AORTIC VALVE LVOT Vmax:   98.80 cm/s LVOT Vmean:  74.000 cm/s LVOT VTI:    0.192 m  AORTA Ao Root diam: 3.10 cm Ao Asc diam:  3.60 cm TRICUSPID VALVE TR Peak grad:   64.0 mmHg TR Vmax:        400.00 cm/s  SHUNTS Systemic VTI:  0.19 m Systemic Diam: 2.10 cm  Fransico Him MD Electronically signed by Fransico Him MD Signature Date/Time: 06/12/2019/3:15:41 PM    Final     VAS [place] LOWER EXTREMITY VENOUS (DVT)  Result Date: 06/09/2019  Lower Venous Study Indications: Swelling.  Risk Factors: Cancer History of metastatic breast cancer. Comparison Study: No prior study on file Performing Technologist: Sharion Dove RVS  Examination Guidelines: A complete evaluation includes B-mode imaging, spectral Doppler, color Doppler, and power Doppler as needed of all accessible portions of each vessel. Bilateral testing is considered an integral part of a complete examination. Limited examinations for reoccurring indications may be performed as noted.  +---------+---------------+---------+-----------+----------+--------------+ RIGHT    CompressibilityPhasicitySpontaneityPropertiesThrombus Aging +---------+---------------+---------+-----------+----------+--------------+ CFV      Partial        Yes      Yes                  Acute          +---------+---------------+---------+-----------+----------+--------------+ SFJ      Full                                                        +---------+---------------+---------+-----------+----------+--------------+ FV Prox  None  Acute          +---------+---------------+---------+-----------+----------+--------------+ FV Mid   None                                         Acute          +---------+---------------+---------+-----------+----------+--------------+ FV DistalNone                                         Acute          +---------+---------------+---------+-----------+----------+--------------+ PFV      Full                                                        +---------+---------------+---------+-----------+----------+--------------+ POP      None           No       No                   Acute          +---------+---------------+---------+-----------+----------+--------------+ PTV      None                                         Acute           +---------+---------------+---------+-----------+----------+--------------+ PERO     None                                         Acute          +---------+---------------+---------+-----------+----------+--------------+ EIV                     Yes      Yes                                 +---------+---------------+---------+-----------+----------+--------------+   +----+---------------+---------+-----------+----------+--------------+ LEFTCompressibilityPhasicitySpontaneityPropertiesThrombus Aging +----+---------------+---------+-----------+----------+--------------+ CFV Full           Yes      Yes                                 +----+---------------+---------+-----------+----------+--------------+     Summary: Right: Findings consistent with acute deep vein thrombosis involving the right common femoral vein, right femoral vein, right popliteal vein, right posterior tibial veins, and right peroneal veins. Left: No evidence of common femoral vein obstruction.  *See table(s) above for measurements and observations. Electronically signed by Harold Barban MD on 06/09/2019 at 8:27:06 AM.    Final     ASSESSMENT: 63 y.o. [place] woman    (1) status post right lumpectomy and sentinel lymph node sampling October 2005 for a 0.6 cm invasive ductal carcinoma involving one out of 2 sentinel lymph nodes sampled, grade 3, triple-negative, treated adjuvantly with doxorubicin and cyclophosphamide 4 followed by weekly paclitaxel 7, followed by adjuvant radiation   RECURRENT DISEASE: (2)  status post right breast upper outer quadrant biopsy and right axillary lymph node biopsy 10/01/2016, both positive for a T2 N1, stage IIIB invasive ductal carcinoma, triple negative, with an MIB-1 of 50-70%   (3) status post right modified radical mastectomy 11/05/2016 showing a pT2 pN1, stage IIIB invasive ductal carcinoma, grade 3, triple negative, with negative margins   (4) not a candidate for  radiation given prior history   (5) adjuvant chemotherapy consisting of carboplatin and gemcitabine given days 1 and 8 of each 21 day cycle, for 6 cycles, starting 11/20/2016, completed 03/11/2017             (a) day 8 cycle 2 omitted because of neutropenia; Neupogen/Neulasta added   (5) HIV positivity: under care of [facility] Luc[facility] [place])   (6) Genetic testing 06/17/2017:  no pathogenic mutations. Genes tested: APC, ATM, AXIN2, BARD1, BLM, BMPR1A, BRCA1, BRCA2, BRIP1, CDH1, CDK4, CDKN2A (p14ARF), CDKN2A (p16INK4a), CEBPA, CHEK2, CTNNA1, DICER1, EPCAM*, GATA2, GREM1*, HRAS, KIT, MEN1, MLH1, MSH2, MSH3, MSH6, MUTYH, NBN, NF1, PALB2, PDGFRA, PMS2, POLD1, POLE, PTEN, RAD50, RAD51C, RAD51D, RUNX1, SDHB, SDHC, SDHD, SMAD4, SMARCA4, STK11, TERC, TERT, TP53, TSC1, TSC2, VHL. The following genes were evaluated for sequence changes only: HOXB13*, NTHL1*, SDHA.              (a) A variant of uncertain significance (VUS) in a gene called NTHL1 was also noted. c.736G>A (p.[id number])   METASTATIC DISEASE: November 2019 (1) Patient seen in urgent care and ultimately ED on 04/14/2018 for shortness of breath, chest xray demonstrated Right pleural effusion.   (a) right thoracenteses on 10/21 and 11/4 results show atypical cells, non diagnostic (b) CT chest 04/22/2018 shows re-accumulation of fluid, and right pleural nodularity. (c) PET scan on 05/01/2018 shows hypermetabolic pleural based metastases, right CP angle nodal metastases, no evidence of malignancy in abdomen and pelvis. (d) bronchoscopy with biopsy by Dr. Lamonte Sakai and BAL on 05/15/2018 was non diagnostic             (e) VAT biopsy of the right pleura 05/30/2018 confirms carcinoma, triple negative             (f) [facility] shows PD-L1 positive (1% in [facility]); otherwise microsatellite stable, TMB low (5/Mb), no PIK3 mutations; other mutations suggest sensitivity to MTOR inhibitors and several TKIs   (2) Atezolizumab started 07/15/2018 given every other week              (a) PET 08/13/2018 shows tumor Right hemithorax (new baseline study)             (b) Atezo changed to Q4w starting with 11/07/2018 dose             (c) PET 11/28/2018 documents progression in the right lung and pleural area as well as lymph nodes, and a T1 skeletal metastasis             (d) atezolizumab discontinued after 12/05/2018 dose   (3) eribulin day 1 and 8 of every 21 day cycle started 12/19/2018             (a) changed to every other week with Neulsta support due to neutropenia causing treatment delays and her h/o HIV (delays due to insurance denial of onpro after 02/20/2019 dose)             (b) PET scan on 03/11/2019 showed stable to improved measurable disease, with multiple new bone lesions             (c) zoledronate given  every 12 weeks starting on 03/25/2019   (4) brain MRI 04/28/2019 documents multiple intracranial metastases             (a) whole brain radiation 04/30/2019 - 05/13/2019, 30 Gy in 10 fractions  (5) right lower extremity DVT diagnosed December 2020     PLAN:  Clarie's respiratory status continues to improve.  She is now down to 4 L of O2.  She is awaiting SNF placement.  Once we know her date of discharge, we can work on rescheduling her PET scan for late December or early January with a follow-up thereafter to discuss further treatment depending on how she is doing.   LOS: 14 days   Mikey Bussing, DNP, AGPCNP-BC, AOCNP 06/17/19   ADDENDUM: Since March's PET scan had had to be rescheduled I have cancelled her appointment with [place] 06/24/2019-- she will see [place] the following week or in any case after the PET results are available.  I will be unavailable until 06/23/2019. Please consult my partners as needed if we can be of further help from this point.   Chauncey Cruel, MD Medical Oncology and Hematology [facility] [address] [place], [place] [address] Tel. [phone]    Fax. [phone]

## 2019-06-17 NOTE — TOC Progression Note (Addendum)
Transition of Care [facility]) - Progression Note    Patient Details  Name: Kayla Price MRN: [id number] Date of Birth: 1955-09-29  Transition of Care [facility]) CM/SW [place], [place] Phone Number: [phone] 06/17/2019, 1:09 PM  Clinical Narrative:     Update: CSW spoke with patient about alternative plan if [facility] does not have bed avail tomorrow if patient is medically stable to dc, patient reports being agreeable to [facility] as back up.   CSW continues to follow for discharge planning needs, MD reports continuing to watch WBC count, patient potentially could be transitioned to PO antibiotics once medically stable to dc to [place].   [facility] has been updated.   Benefits Check initiated for lovenox 80mg  subcutaneous q12h, pending costs.   Expected Discharge Plan: [place] Barriers to Discharge: Continued Medical Work up  Expected Discharge Plan and Services Expected Discharge Plan: [place] In-house Referral: Clinical Social Work   Post Acute Care Choice: [place] Living arrangements for the past 2 months: [place] Determinants of Health (SDOH) Interventions    Readmission Risk Interventions Readmission Risk Prevention Plan 06/16/2019  Transportation Screening Complete  Medication Review Press photographer) Complete  PCP or Specialist appointment within 3-5 days of discharge Complete  HRI or Home Care Consult Complete  SW Recovery Care/Counseling Consult Complete  Palliative Care Screening Not [place] Complete  Some recent data might be hidden

## 2019-06-17 NOTE — Care Management Important Message (Signed)
Important Message  Patient Details  Name: Kayla Price MRN: [id number] Date of Birth: 1955-08-31   Medicare Important Message Given:  Yes     Shelda Altes 06/17/2019, 1:10 PM

## 2019-06-17 NOTE — Significant Event (Signed)
Rapid Response Event Note  Overview: Called d/t AMS, pt not speaking or follow commands-pt alert and oriented at baseline Time Called: 2205 Arrival Time: 2212 Event Type: Neurologic  Initial Focused Assessment: Pt laying in bed, awake. When I say pt's name, she will respond with "huh?" and when asked how she is, she says "fine?" Pt will not follow commands or speak further. She will move all extremities equally to painful stimuli. Pupils 2 and brisk. Lungs diminished t/o R>L. Skin warm to touch. Pt breathing is a little labored. T-99.5(R), HR-99, BP-124/79, RR-27, SpO2-98% on 4L   Bipap already ordered for pt. Asked RN to call RT to place pt back on bipap.  Interventions: CBG-99 EKG-NSR CBC/BMP Bipap(already ordered) Plan of Care (if not transferred): Await lab results and notify NP of abnormalities. Bipap d/t increased WOB and AMS. Pt is a DNR so I don't think an ABG will change the plan of care for this pt. Please continue to monitor pt closely. Call RRT if further assistance needed.  Event Summary: Name of Physician Notified: Kennon Holter, NP at 2225    at          [place], Carren Rang

## 2019-06-18 ENCOUNTER — Inpatient Hospital Stay (HOSPITAL_COMMUNITY): Payer: Medicare HMO

## 2019-06-18 ENCOUNTER — Telehealth: Payer: Self-pay | Admitting: Oncology

## 2019-06-18 DIAGNOSIS — R4182 Altered mental status, unspecified: Secondary | ICD-10-CM

## 2019-06-18 DIAGNOSIS — G9341 Metabolic encephalopathy: Secondary | ICD-10-CM

## 2019-06-18 LAB — BASIC METABOLIC PANEL
Anion gap: 9 (ref 5–15)
BUN: 19 mg/dL (ref 8–23)
CO2: 27 mmol/L (ref 22–32)
Calcium: 9 mg/dL (ref 8.9–10.3)
Chloride: 105 mmol/L (ref 98–111)
Creatinine, Ser: 1.03 mg/dL — ABNORMAL HIGH (ref 0.44–1.00)
GFR calc Af Amer: >60 mL/min (ref >60)
GFR calc non Af Amer: 58 mL/min — ABNORMAL LOW (ref >60)
Glucose, Bld: 91 mg/dL (ref 70–99)
Potassium: 3.2 mmol/L — ABNORMAL LOW (ref 3.5–5.1)
Sodium: 141 mmol/L (ref 135–145)

## 2019-06-18 LAB — CBC
HCT: 26.8 % — ABNORMAL LOW (ref 36.0–46.0)
Hemoglobin: 8.3 g/dL — ABNORMAL LOW (ref 12.0–15.0)
MCH: 31.1 pg (ref 26.0–34.0)
MCHC: 31 g/dL (ref 30.0–36.0)
MCV: 100.4 fL — ABNORMAL HIGH (ref 80.0–100.0)
Platelets: 234 10*3/uL (ref 150–400)
RBC: 2.67 MIL/uL — ABNORMAL LOW (ref 3.87–5.11)
RDW: 23.1 % — ABNORMAL HIGH (ref 11.5–15.5)
WBC: 14.3 10*3/uL — ABNORMAL HIGH (ref 4.0–10.5)
nRBC: 0.6 % — ABNORMAL HIGH (ref 0.0–0.2)

## 2019-06-18 LAB — URINALYSIS, ROUTINE W REFLEX MICROSCOPIC
Bilirubin Urine: NEGATIVE
Glucose, UA: NEGATIVE mg/dL
Ketones, ur: 5 mg/dL — AB
Leukocytes,Ua: NEGATIVE
Nitrite: NEGATIVE
Protein, ur: 100 mg/dL — AB
Specific Gravity, Urine: 1.02 (ref 1.005–1.030)
pH: 5 (ref 5.0–8.0)

## 2019-06-18 LAB — TSH: TSH: 19.709 u[IU]/mL — ABNORMAL HIGH (ref 0.350–4.500)

## 2019-06-18 LAB — AMMONIA: Ammonia: 19 umol/L (ref 9–35)

## 2019-06-18 LAB — HEPARIN ANTI-XA: Heparin LMW: 1.75 IU/mL

## 2019-06-18 LAB — VITAMIN B12: Vitamin B-12: 2112 pg/mL — ABNORMAL HIGH (ref 180–914)

## 2019-06-18 MED ORDER — KCL IN DEXTROSE-NACL 40-5-0.9 MEQ/L-%-% IV SOLN
INTRAVENOUS | Status: DC
  Filled 2019-06-18 (×3): qty 1000

## 2019-06-18 MED ORDER — SODIUM CHLORIDE 0.9 % IV SOLN
1.0000 g | INTRAVENOUS | Status: DC
  Filled 2019-06-18: qty 10

## 2019-06-18 MED ORDER — SODIUM CHLORIDE 0.9 % IV SOLN
2.0000 g | Freq: Two times a day (BID) | INTRAVENOUS | Status: DC
  Administered 2019-06-18 – 2019-06-19 (×2): 2 g via INTRAVENOUS
  Filled 2019-06-18: qty 2
  Filled 2019-06-18: qty 20
  Filled 2019-06-18: qty 2

## 2019-06-18 MED ORDER — MORPHINE SULFATE (PF) 2 MG/ML IV SOLN
2.0000 mg | INTRAVENOUS | Status: DC | PRN
  Administered 2019-06-18 – 2019-06-21 (×8): 2 mg via INTRAVENOUS
  Filled 2019-06-18 (×8): qty 1

## 2019-06-18 MED ORDER — SODIUM CHLORIDE 0.9 % IV SOLN
2000.0000 mg | Freq: Once | INTRAVENOUS | Status: AC
  Administered 2019-06-18: 22:00:00 2000 mg via INTRAVENOUS
  Filled 2019-06-18: qty 20

## 2019-06-18 MED ORDER — DEXTROSE 5 % IV SOLN
800.0000 mg | Freq: Three times a day (TID) | INTRAVENOUS | Status: DC
  Administered 2019-06-19: 02:00:00 800 mg via INTRAVENOUS
  Filled 2019-06-18 (×3): qty 16

## 2019-06-18 MED ORDER — SODIUM CHLORIDE 0.9 % IV SOLN
1.0000 g | Freq: Four times a day (QID) | INTRAVENOUS | Status: DC
  Filled 2019-06-18 (×2): qty 1000

## 2019-06-18 MED ORDER — SODIUM CHLORIDE 0.9 % IV SOLN
2.0000 g | INTRAVENOUS | Status: DC
  Administered 2019-06-19 (×3): 2 g via INTRAVENOUS
  Filled 2019-06-18: qty 2000
  Filled 2019-06-18: qty 2
  Filled 2019-06-18 (×2): qty 2000
  Filled 2019-06-18 (×2): qty 2

## 2019-06-18 MED ORDER — LORAZEPAM 2 MG/ML IJ SOLN
1.0000 mg | Freq: Four times a day (QID) | INTRAMUSCULAR | Status: DC | PRN
  Administered 2019-06-18 – 2019-06-22 (×7): 1 mg via INTRAVENOUS
  Filled 2019-06-18 (×8): qty 1

## 2019-06-18 MED ORDER — POTASSIUM CHLORIDE CRYS ER 20 MEQ PO TBCR: 30.0000 meq | EXTENDED_RELEASE_TABLET | Freq: Once | ORAL | Status: DC

## 2019-06-18 MED ORDER — GADOBUTROL 1 MMOL/ML IV SOLN
9.0000 mL | Freq: Once | INTRAVENOUS | Status: AC | PRN
  Administered 2019-06-18: 18:00:00 9 mL via INTRAVENOUS

## 2019-06-18 MED ORDER — THIAMINE HCL 100 MG/ML IJ SOLN
250.0000 mg | Freq: Three times a day (TID) | INTRAVENOUS | Status: AC
  Administered 2019-06-19 – 2019-06-21 (×9): 250 mg via INTRAVENOUS
  Filled 2019-06-18 (×4): qty 2.5
  Filled 2019-06-18: qty 2
  Filled 2019-06-18 (×4): qty 2.5

## 2019-06-18 MED ORDER — LEVETIRACETAM IN NACL 500 MG/100ML IV SOLN
500.0000 mg | Freq: Two times a day (BID) | INTRAVENOUS | Status: DC
  Administered 2019-06-19 – 2019-06-21 (×5): 500 mg via INTRAVENOUS
  Filled 2019-06-18 (×5): qty 100

## 2019-06-18 MED ORDER — VANCOMYCIN HCL IN DEXTROSE 1-5 GM/200ML-% IV SOLN
1000.0000 mg | Freq: Two times a day (BID) | INTRAVENOUS | Status: DC
  Administered 2019-06-19: 05:00:00 1000 mg via INTRAVENOUS
  Filled 2019-06-18 (×2): qty 200

## 2019-06-18 NOTE — Procedures (Signed)
Patient Name: Kayla Price  MRN: [id number]  Epilepsy Attending: Lora Havens  Referring Physician/Provider: Dr Debbe Odea Date: 06/18/2019 Duration: 24.56 mins  Patient history: 63yo F with HIV, brain mets and now with ams. EEG to evaluate for seizure  Level of alertness: awake  AEDs during EEG study: None  Technical aspects: This EEG study was done with scalp electrodes positioned according to the 10-20 International system of electrode placement. Electrical activity was acquired at a sampling rate of 500Hz  and reviewed with a high frequency filter of 70Hz  and a low frequency filter of 1Hz . EEG data were recorded continuously and digitally stored.   DESCRIPTION: During awake state, no clear posterior dominant rhythm. EEG also showed continuous generalized  3-5hz  theta-delta slowing. Triphasic waves, generalized, maximal bifrontal, at 2Hz  were also noted. Hyperventilation and photic stimulation were not performed.  ABNORMALITY - Continuous slow, generalized - Triphasic waves, generalized  IMPRESSION: This study is  Suggestive of severe diffuse encephalopathy, non specific to etiology but could be secondary to toxic-metabolic causes, cepefime toxicity. No seizures or definite epileptiform discharges were seen throughout the recording.  Kayla Price Kayla Price

## 2019-06-18 NOTE — Progress Notes (Signed)
RT assessed pt for BIPAP V60 need. Pt resting comfortably, Respiratory status is stable on [place] humidified 6 Lpm. Pt sats 97% at this time. Pt not requiring BIPAP at this time. RT will continue to monitor.

## 2019-06-18 NOTE — Consult Note (Addendum)
Neurology Consultation  Reason for Consult: Altered mental status  Referring Physician: Dr. Wynelle Cleveland  CC: Altered mental status  History is obtained from: Chart  HPI: Kayla Price is a 63 y.o. female with past medical history of vitamin D deficiency, osteoarthritis, hypertension, hyperlipidemia, hypothyroidism post radiation, HIV, gestational diabetes, chronic kidney disease, breast cancer.  Per chart patient was recently hospitalized from 05/27/2019 20-05/31/2019 for acute kidney injury, hypotension and superficial ulcerations of the groin/perineum and was discharged on 05/31/2019.  Patient returned to the hospital on 06/01/2019 secondary to worsening drainage from the known skin ulceration along with generalized weakness.  Patient was admitted secondary to hypotension, acute kidney injury and ulcers.  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  During hospital course it was complicated by community-acquired pneumonia along with extensive right lower extremity DVT, hypoxic respiratory failure.  Per chart, apparently patient was conversant on 06/17/2019 but became significantly confused overnight.  On consultation patient is laying in her bed moaning and does not follow any commands.  Work up that has been done: Blood culture Urinalysis Urine culture-grew E. coli on 05/28/2019 Covid test Magnesium Multiple CMP's   Past Medical History:  Diagnosis Date  . AKI (acute kidney injury) ([place]) 06/02/2019  . Alopecia areata 11/28/2009  . Bell's palsy   . Cancer [facility]) 2005   Breast cancer   chemotherapy and radiation  . CKD (chronic kidney disease) stage 3, GFR 30-59 ml/min 06/08/2015  . Dry eye syndrome   . Family history of lung cancer   . Family history of non-Hodgkin's lymphoma   . Fasting hyperglycemia   . Gestational diabetes    2001  . HIP FRACTURE, RIGHT 05/06/2008  . History of kidney stones   . HIV DISEASE 03/27/2006  .  HYPERLIPIDEMIA, MIXED 12/15/2007  . HYPERTENSION 03/27/2006  . 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 MENORRHAGIA, POSTMENOPAUSAL 02/03/2009  . Osteoarthritis of left knee 06/08/2015  . OSTEOARTHROSIS, LOCAL, SCND, UNSPC SITE 04/07/2007  . Personal history of chemotherapy 11/2018  . PVD 04/07/2007  . Tinea capitis   . TRIGGER FINGER 05/06/2008  . Unspecified vitamin D deficiency 08/06/2007     Family History  Problem Relation Age of Onset  . Heart attack Brother        Massive MI in 66s  . Stroke Brother        CAD  . Kidney disease Mother   . Stroke Mother   . Diabetes Mother   . Liver disease Sister   . COPD Sister        had I-131 rx of hyperthyroidism  . Diabetes Sister   . Stroke Sister   . Lung cancer Paternal Uncle        hx smoking  . Cancer Cousin   . Non-Hodgkin's lymphoma Cousin 25       cancer x3, in prostate and lung- unsure if met/spread or if primaries  . Heart failure Father   . Heart disease Father   . Arthritis Father   . Sarcoidosis Sister    Social History:   reports that she has never smoked. She has never used smokeless tobacco. She reports that she does not drink alcohol or use drugs.  Medications  Current Facility-Administered Medications:  .  0.9 %  sodium chloride infusion, , Intravenous, PRN, Modena Jansky, MD, Last Rate: 10 mL/hr at 06/10/19 2141, New Bag at 06/10/19 2141 .  acetaminophen (TYLENOL) tablet 650 mg, 650  mg, Oral, Q6H PRN, 650 mg at 06/02/19 1142 **OR** acetaminophen (TYLENOL) suppository 650 mg, 650 mg, Rectal, Q6H PRN, Jonelle Sidle, Mohammad L, MD .  calcium carbonate (dosed in mg elemental calcium) suspension 500 mg of elemental calcium, 500 mg of elemental calcium, Oral, Q6H PRN, Jonelle Sidle, Mohammad L, MD .  camphor-menthol (SARNA) lotion 1 application, 1 application, Topical, D6Q PRN **AND** hydrOXYzine (ATARAX/VISTARIL) tablet 25 mg, 25 mg, Oral, Q8H PRN, Garba, Mohammad L, MD .  ceFEPIme (MAXIPIME) 2 g in sodium chloride 0.9 %  100 mL IVPB, 2 g, Intravenous, Q8H, Hongalgi, Anand D, MD, Last Rate: 200 mL/hr at 06/18/19 1407, 2 g at 06/18/19 1407 .  Chlorhexidine Gluconate Cloth 2 % PADS 6 each, 6 each, Topical, Daily, Geradine Girt, DO, 6 each at 06/16/19 0920 .  darunavir-cobicistat (PREZCOBIX) 800-150 MG per tablet 1 tablet, 1 tablet, Oral, QHS, Vann, Jessica U, DO, 1 tablet at 06/16/19 2122 .  dextrose 5 % and 0.9 % NaCl with KCl 40 mEq/L infusion, , Intravenous, Continuous, Rizwan, Saima, MD .  docusate sodium (ENEMEEZ) enema 283 mg, 1 enema, Rectal, PRN, Elwyn Reach, MD .  dolutegravir (TIVICAY) tablet 50 mg, 50 mg, Oral, QHS, 50 mg at 06/16/19 2122 **AND** rilpivirine (EDURANT) tablet 25 mg, 25 mg, Oral, QHS, Susa Raring, RPH, 25 mg at 06/16/19 2123 .  enoxaparin (LOVENOX) injection 80 mg, 80 mg, Subcutaneous, Q12H, Carney, Jessica C, RPH, 80 mg at 06/18/19 1130 .  feeding supplement (ENSURE ENLIVE) (ENSURE ENLIVE) liquid 237 mL, 237 mL, Oral, TID BM, Domenic Polite, MD, 237 mL at 06/17/19 1506 .  HYDROcodone-acetaminophen (NORCO/VICODIN) 5-325 MG per tablet 1 tablet, 1 tablet, Oral, Q6H PRN, Geradine Girt, DO, 1 tablet at 06/11/19 0222 .  levothyroxine (SYNTHROID) tablet 200 mcg, 200 mcg, Oral, QAC breakfast, Eulogio Bear U, DO, 200 mcg at 06/17/19 0739 .  multivitamin with minerals tablet 1 tablet, 1 tablet, Oral, Daily, Domenic Polite, MD, 1 tablet at 06/17/19 [phone] .  ondansetron (ZOFRAN) tablet 4 mg, 4 mg, Oral, Q6H PRN **OR** ondansetron (ZOFRAN) injection 4 mg, 4 mg, Intravenous, Q6H PRN, Garba, Mohammad L, MD .  sodium chloride flush (NS) 0.9 % injection 10-40 mL, 10-40 mL, Intracatheter, PRN, Magrinat, Virgie Dad, MD, 10 mL at 06/10/19 0406 .  sorbitol 70 % solution 30 mL, 30 mL, Oral, PRN, Jonelle Sidle, Mohammad L, MD .  zolpidem (AMBIEN) tablet 5 mg, 5 mg, Oral, QHS PRN, Elwyn Reach, MD, 5 mg at 06/11/19 2143   Exam: Current vital signs: BP 114/80 (BP Location: Left Arm)   Pulse 83   Temp  97.7 F (36.5 C) (Axillary)   Resp 20   Ht '5\' 7"'  (1.702 m)   Wt 133.4 kg   LMP 11/06/2003   SpO2 100%   BMI 46.05 kg/m  Vital signs in last 24 hours: Temp:  [97.7 F (36.5 C)-97.9 F (36.6 C)] 97.7 F (36.5 C) (12/24 0430) Pulse Rate:  [81-100] 83 (12/24 0524) Resp:  [19-23] 20 (12/24 0524) BP: (103-114)/(76-80) 114/80 (12/24 0430) SpO2:  [98 %-100 %] 100 % (12/24 0524)  ROS:   Unable to obtain due to altered mental status.     Physical Exam  Constitutional: Appears well-developed and well-nourished.  Psych: Moaning Eyes: Proptosis bilateral eyes with left greater than right HENT: Bleeding where nasal cannula is located Head: Normocephalic.  Cardiovascular: Normal rate and regular rhythm.  Respiratory: Effort normal, non-labored breathing GI: Soft.  No distension.   Skin: WDI  Neuro: Mental  Status: Patient is laying in bed, eyes wide open, moaning. Does not follow commands or answer questions. Does not attempt to communicate.  Cranial Nerves: II: Vi blinks to threat bilaterally III,IV, VI: Will look toward side that noxious stimuli is given.  Pupils equal, round and reactive to light V: Winces to noxious stimuli VII: Facial movement is symmetric.  VIII: No response to voice Motor: Withdraws briskly all 4 extremities from noxious stimuli Sensory: Draws from noxious stimuli 4 extremities Deep Tendon Reflexes: 2+ in the upper extremities I could not elicit knee jerk Plantars: Downgoing bilaterally Cerebellar: Unable to assess  Labs I have reviewed labs in epic and the results pertinent to this consultation are:   CBC    Component Value Date/Time   WBC 14.3 (H) 06/18/2019 1030   RBC 2.67 (L) 06/18/2019 1030   HGB 8.3 (L) 06/18/2019 1030   HGB 11.9 (L) 04/17/2019 1304   HGB 13.1 05/13/2017 0924   HCT 26.8 (L) 06/18/2019 1030   HCT 40.3 05/13/2017 0924   PLT 234 06/18/2019 1030   PLT 340 04/17/2019 1304   PLT 158 05/13/2017 0924   MCV 100.4 (H)  06/18/2019 1030   MCV 100.6 05/13/2017 0924   MCH 31.1 06/18/2019 1030   MCHC 31.0 06/18/2019 1030   RDW 23.1 (H) 06/18/2019 1030   RDW 16.0 (H) 05/13/2017 0924   LYMPHSABS 0.5 (L) 06/01/2019 1825   LYMPHSABS 1.2 05/13/2017 0924   MONOABS 0.2 06/01/2019 1825   MONOABS 0.3 05/13/2017 0924   EOSABS 0.0 06/01/2019 1825   EOSABS 0.1 05/13/2017 0924   BASOSABS 0.0 06/01/2019 1825   BASOSABS 0.0 05/13/2017 0924    CMP     Component Value Date/Time   NA 141 06/18/2019 1030   NA 140 05/13/2017 0927   K 3.2 (L) 06/18/2019 1030   K 4.1 05/13/2017 0927   CL 105 06/18/2019 1030   CO2 27 06/18/2019 1030   CO2 29 05/13/2017 0927   GLUCOSE 91 06/18/2019 1030   GLUCOSE 102 05/13/2017 0927   BUN 19 06/18/2019 1030   BUN 17.7 05/13/2017 0927   CREATININE 1.03 (H) 06/18/2019 1030   CREATININE 1.35 (H) 04/17/2019 1304   CREATININE 1.62 (H) 09/18/2018 0911   CREATININE 1.4 (H) 05/13/2017 0927   CALCIUM 9.0 06/18/2019 1030   CALCIUM 10.5 (H) 05/13/2017 0927   PROT 5.3 (L) 06/16/2019 0500   PROT 8.1 05/13/2017 0927   ALBUMIN 1.9 (L) 06/16/2019 0500   ALBUMIN 3.5 05/13/2017 0927   AST 29 06/16/2019 0500   AST 39 04/17/2019 1304   AST 14 05/13/2017 0927   ALT 21 06/16/2019 0500   ALT 12 04/17/2019 1304   ALT 12 05/13/2017 0927   ALKPHOS 53 06/16/2019 0500   ALKPHOS 90 05/13/2017 0927   BILITOT 0.5 06/16/2019 0500   BILITOT 0.3 04/17/2019 1304   BILITOT 0.27 05/13/2017 0927   GFRNONAA 58 (L) 06/18/2019 1030   GFRNONAA 42 (L) 04/17/2019 1304   GFRNONAA 34 (L) 09/18/2018 0911   GFRAA >60 06/18/2019 1030   GFRAA 48 (L) 04/17/2019 1304   GFRAA 39 (L) 09/18/2018 0911    Lipid Panel     Component Value Date/Time   CHOL 179 09/18/2018 0911   CHOL 191 10/15/2014 1007   TRIG 141 09/18/2018 0911   HDL 54 09/18/2018 0911   HDL 65 10/15/2014 1007   CHOLHDL 3.3 09/18/2018 0911   VLDL 15 11/15/2016 1104   LDLCALC 101 (H) 09/18/2018 0911     Imaging  I have reviewed the images  obtained: None to review  Etta Quill PA-C [facility] [phone] 06/18/2019, 4:11 PM   MRI brain with and without contrast (12/24): 1. Decrease in size of multiple brain metastases identified on the prior exam. No new lesions are present. 2. Decreased vasogenic edema, still predominantly occipital lobes. 3. Diffuse marrow signal changes in the skull concerning for metastatic disease. 4. Left maxillary and sphenoid sinus disease.  Assessment: 63 year old female presenting to [facility] secondary to worsening lethargy and found to be hypotensive with acute kidney injury which then during hospital course was complicated by hypoxia, community-acquired pneumonia along with extensive lower extremity DVT.  Patient apparently was doing well and conversant up until yesterday.  Today she was found to have significant altered mental status.  Exam findings are most consistent with a severe delirium. Multiple brain mets on MRI are now smaller than on prior MRI and there is decreased vasogenic edema. However, given the extent of the metastatic lesions, her neurological reserve is compromised and she would be at increased risk for delirium from a variety of etiologies including infection and metabolic derangements.   1. Recent labs do not show any metabolic abnormalities sufficient to result in this degree of delirium. Therefore, a metabolic encephalopathy is unlikely.  2. Most likely etiology for her delirium is infection, given elevated WBC, skin to forehead warm to touch, immune-compromised state. DDx would include a systemic infection (viral or bacterial) versus a bacterial meningitis. Unlikely to be HSV encephalitis as MRI shows no temporal lobe signal abnormality, but given that we are unable to obtain an LP, would start empiric IV acyclovir. Of note, it would be highly unlikely for HSV2 to spread to the brain from a concomitant genital flare - the typical source for HSV encephalitis  is latent HSV-1 in the geniculate ganglion.   3. Presentation not consistent with benzodiazepine or EtOH withdrawal as BP is stable and tachycardia has been mild and intermittent.  4. No signal changes on MRI to suggest a Wernicke's encephalopathy, but this should be treated empirically.  5.  as far as metabolic issues, so unlikely to .  UA is negative for leukocytes and/or nitrites.  Patient sodium, potassium are close to normal limits if not within normal limits.  Patient's creatinine is 1.03 and BUN is 19 which would rule out uremia.  She continues to have a white blood cell count secondary to her pneumonia however this was a sudden onset of confusion.  At this point differential would include seizure versus possible stroke. 6. Ammonia normal. AST and ALT normal. Vitamin B12 level elevated.  7. Although she does not appear clinically to be seizing, the multiple brain mets could serve as seizure foci and subclinical seizures are therefore on the DDx.  8. EEG shows 2 Hx triphasics. DDx includes electrographic seizure versus cefepime-induced neurotoxicity.    Recommendations: -Repeat urine culture -RPR, TSH (ordered) --Empiric IV thiamine 250 mg IV TID x 3 days (ordered) --Would start on empiric IV antibiotics for possible meningitis: Vancomycin, ceftriaxone, ampicillin and acyclovir. --Unable to obtain LP due to risk of herniation from brain mets --Recommend expedited ID consult to assess for possible systemic infection and to provide second opinion regarding empiric meningitis-dose antibiotics given that LP is not safe. Also will need ID to provide an alternative to cefepime. Although it is also a cephalosporin, ceftriaxone is rarely associated with neurotoxicity and should be considered as a possible alternative.  --Starting empiric Keppra with 2000  mg load followed by 500 mg IV BID (ordered) --Repeat EEG at 8 AM to assess for possible improvement after addition of Keppra (ordered) --Discussed  with Hospitalist team.   I have seen and examined the patient. I have formulated the assessment and recommendations. 63 year old female with severe delirium based on history and exam findings. DDx, diagnostic plan and empiric treatment recommendations as above.  Electronically signed: Dr. Kerney Elbe

## 2019-06-18 NOTE — Progress Notes (Addendum)
[place] for Lovenox Indication: DVT  Allergies  Allergen Reactions  . Lisinopril Anaphylaxis, Swelling and Other (See Comments)    Swelling of tongue and mouth 11/05/16- tolerates Olmesartan  . Pepcid [Famotidine] Other (See Comments)    PPI H2, BLOCKERS LOWER GASTRIC PH WHICH WOULD LEAD TO SUBTHERAPEUTIC RILPIVIRINE LEVELS AND POTENTIAL VIROLOGICAL FAILURE WITH RESISTANCE  . Prilosec [Omeprazole] Other (See Comments)    PPI H2, BLOCKERS LOWER GASTRIC PH WHICH WOULD LEAD TO SUBTHERAPEUTIC RILPIVIRINE LEVELS AND POTENTIAL VIROLOGICAL FAILURE WITH RESISTANCE  . Tums [Calcium Carbonate Antacid] Other (See Comments)    TUMS ANTACIDS CAN LOWER GASTRIC PH WHICH COULD  LEAD TO SUBTHERAPEUTIC RILPIVIRINE LEVELS AND POTENTIAL VIROLOGICAL FAILURE WITH RESISTANCE TUMS CAN BE GIVEN BUT NEED CONSULT WITH ID PHARMACY RE TIMING. I PREFER HER TO AVOID ALL TOGETHER    Patient Measurements: Height: 5\' 7"  (170.2 cm) Weight: 294 lb (133.4 kg) IBW/kg (Calculated) : 61.6  Vital Signs:    Labs: Recent Labs    06/16/19 0500 06/16/19 2117 06/17/19 0500 06/17/19 2222 06/18/19 1030 06/18/19 1644  HGB 8.3*  --  8.7* 8.8* 8.3*  --   HCT 26.7*  --  27.5* 27.6* 26.8*  --   PLT 258  --  252 258 234  --   HEPRLOWMOCWT  --  2.00  --   --   --  1.75  CREATININE 1.12*  --   --  1.11* 1.03*  --     Estimated Creatinine Clearance: 79.7 mL/min (A) (by C-G formula based on SCr of 1.03 mg/dL (H)).   Medical History: Past Medical History:  Diagnosis Date  . AKI (acute kidney injury) ([place]) 06/02/2019  . Alopecia areata 11/28/2009  . Bell's palsy   . Cancer [facility]) 2005   Breast cancer   chemotherapy and radiation  . CKD (chronic kidney disease) stage 3, GFR 30-59 ml/min 06/08/2015  . Dry eye syndrome   . Family history of lung cancer   . Family history of non-Hodgkin's lymphoma   . Fasting hyperglycemia   . Gestational diabetes    2001  . HIP FRACTURE, RIGHT  05/06/2008  . History of kidney stones   . HIV DISEASE 03/27/2006  . HYPERLIPIDEMIA, MIXED 12/15/2007  . HYPERTENSION 03/27/2006  . 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 MENORRHAGIA, POSTMENOPAUSAL 02/03/2009  . Osteoarthritis of left knee 06/08/2015  . OSTEOARTHROSIS, LOCAL, SCND, UNSPC SITE 04/07/2007  . Personal history of chemotherapy 11/2018  . PVD 04/07/2007  . Tinea capitis   . TRIGGER FINGER 05/06/2008  . Unspecified vitamin D deficiency 08/06/2007    [name] Pharmacy consulted for Lovenox therapy.  -low molecular weight heparin level= 1.75 at 4:45pm (dose given at 11:30am)    Goal of Therapy:  Anti-Xa lovenox level 0.6-1.2 drawn 4 hrs after dose. Monitor platelets by anticoagulation protocol: Yes   Plan:  -hold lovenox for now -Check a low molecular weight heparin level at 11:30pm  -Once low molecular weight heparin level is around 0.5, will consider resuming at 30% reduction from the original dose  Hildred Laser, PharmD Clinical Pharmacist **Pharmacist phone directory can now be found on [email or web address] ([email or web address]).  Listed under [place].

## 2019-06-18 NOTE — Progress Notes (Signed)
Palliative Medicine RN Note: Our team continues to follow at a distance. Rapid response noted from last night as well as plan from Dr Jana Hakim for outpatient PET scan. We will continue to monitor Kayla Price's progress towards SNF placement, which is her goal.  Marjie Skiff. Stacie Templin, RN, BSN, [facility] Palliative Medicine Team 06/18/2019 9:38 AM Office [phone]

## 2019-06-18 NOTE — Progress Notes (Addendum)
PROGRESS NOTE    Kayla Price   BTD:[id number]  DOB: [birth year]  DOA: 06/01/2019 PCP: Lauree Chandler, NP   Brief Narrative:  Kayla Price 63 year old female with PMH of metastatic breast cancer with metastasis to brain and right pleura, s/p whole brain radiation, HIV, CKD stage II, recently hospitalized 05/27/2019-05/31/2023 acute kidney injury, hypotension and superficial ulcerations of the groin/perineum who presented the next day of discharge 06/01/2019 due to worsening drainage from her known skin ulcerations along with generalized weakness.  She was admitted for hypotension, acute kidney injury and ulcers of her groin and perineum.  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  Hospital course complicated by community-acquired pneumonia, extensive right lower extremity DVT, acute hypoxic respiratory failure.  She was transferred to progressive care unit on 12/17, PCCM consulted, BiPAP initiated, primary oncologist and PMT consulted, since she was doing so poorly, discussions regarding transitioning to comfort care were ongoing.    Subjective: Events of last night noted.  Apparently the patient was confused last night and a little lethargic and thus was placed on BiPAP overnight.  This morning we have removed the BiPAP.  She remains confused and is not answering questions or following commands.  Assessment & Plan:   Principal Problem:  Acute encephalopathy-likely metabolic -We will follow-up on blood work this morning-I will also check a UA as she has had a Foley catheter for a number of days now-repeat chest x-ray does not show progression of infiltrates and oxygen level is in the 90s and thus I feel that her pneumonia is improving. 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 -This was the reason for her admission  -She has completed a course of Valtrex -Continue to follow    AKI (acute kidney injury)  -CKD 2/3  -Renal ultrasound 12/3 was unrevealing -This has resolved  Hypoxic respiratory failure secondary to community-acquired pneumonia -Patient required treatment with BiPAP starting on 12/18 and has been weaned down now to nasal cannula - she appears to be comfortable at rest - Continue to wean oxygen as able - She is being treated with IV antibiotics including Vanc and cefepime which we are continuing -  MRSA PCR screen was negative and therefore vancomycin was discontinued on 12/23    HIV -Relatively well controlled with a CD4 count greater than 400 on 10/26 -She will need to follow-up with ID as outpatient in 4 to 6 weeks - I spoke with Dr Lucianne Lei dam today who recommends that I stop Selzentry  Extensive left leg DVT -Has been placed on Lovenox and it is planned for her to continue Lovenox as outpatient  Metastatic cancer-mets to the brain and right pleural space - is status post post whole brain radiation 11/20, chemotherapy and right modified radical mastectomy -There is a plan for repeat PET scan as outpatient-she is followed by Dr. Jana Hakim who has been seeing her in the hospital as well  Time spent in minutes: 35 DVT prophylaxis: Lovenox Code Status: DO NOT RESUSCITATE Family Communication:  Disposition Plan: To be determined-need to determine the cause for her confusion today Consultants:   Oncology  Critical care  Infectious disease  Palliative care Procedures:   BiPAP  Catheter Antimicrobials:  Anti-infectives (From admission, onward)   Start     Dose/Rate Route Frequency Ordered Stop   06/15/19 1000  vancomycin (VANCOREADY) IVPB 1500 mg/300 mL  Status:  Discontinued     1,500 mg 150 mL/hr over 120 Minutes Intravenous Every 24 hours 06/14/19 1017 06/17/19 1139   06/14/19 1800  vancomycin (VANCOREADY) IVPB 1500 mg/300 mL  Status:  Discontinued     1,500 mg 150  mL/hr over 120 Minutes Intravenous Every 24 hours 06/14/19 0924 06/14/19 1017   06/14/19 1400  ceFEPIme (MAXIPIME) 2 g in sodium chloride 0.9 % 100 mL IVPB     2 g 200 mL/hr over 30 Minutes Intravenous Every 8 hours 06/14/19 0924     06/13/19 1800  vancomycin (VANCOREADY) IVPB 1250 mg/250 mL  Status:  Discontinued     1,250 mg 166.7 mL/hr over 90 Minutes Intravenous Every 24 hours 06/12/19 1506 06/14/19 0924   06/13/19 1130  ceFEPIme (MAXIPIME) 2 g in sodium chloride 0.9 % 100 mL IVPB  Status:  Discontinued     2 g 200 mL/hr over 30 Minutes Intravenous Every 12 hours 06/13/19 1159 06/14/19 0924   06/12/19 1800  vancomycin (VANCOREADY) IVPB 1750 mg/350 mL     1,750 mg 175 mL/hr over 120 Minutes Intravenous  Once 06/12/19 1506 06/12/19 1940   06/12/19 1600  ceFEPIme (MAXIPIME) 2 g in sodium chloride 0.9 % 100 mL IVPB  Status:  Discontinued     2 g 200 mL/hr over 30 Minutes Intravenous Every 12 hours 06/12/19 1506 06/13/19 1159   06/10/19 0600  cefTRIAXone (ROCEPHIN) 1 g in sodium chloride 0.9 % 100 mL IVPB  Status:  Discontinued     1 g 200 mL/hr over 30 Minutes Intravenous Every 24 hours 06/09/19 0732 06/12/19 1335   06/09/19 0800  doxycycline (VIBRAMYCIN) 100 mg in sodium chloride 0.9 % 250 mL IVPB  Status:  Discontinued     100 mg 125 mL/hr over 120 Minutes Intravenous Every 12 hours 06/09/19 0735 06/12/19 1335   06/09/19 0745  cefTRIAXone (ROCEPHIN) 1 g in sodium chloride 0.9 % 100 mL IVPB     1 g 200 mL/hr over 30 Minutes Intravenous  Once 06/09/19 0738 06/09/19 0924   06/05/19 2200  valACYclovir (VALTREX) tablet 1,000 mg     1,000 mg Oral 2 times daily 06/05/19 1418 06/12/19 1101   06/05/19 1000  valACYclovir (VALTREX) tablet 1,000 mg  Status:  Discontinued     1,000 mg Oral 3 times daily 06/05/19 0834 06/05/19 1418   06/02/19 2200  darunavir-cobicistat (PREZCOBIX) 800-150 MG per tablet 1 tablet     1 tablet Oral Daily at bedtime 06/02/19 0950     06/02/19 2200  dolutegravir  (TIVICAY) tablet 50 mg     50 mg Oral Daily at bedtime 06/02/19 1455     06/02/19 2200  rilpivirine (EDURANT) tablet 25 mg     25 mg Oral Daily at bedtime 06/02/19 1455     06/02/19 1700  dolutegravir (TIVICAY) tablet 50 mg  Status:  Discontinued     50 mg Oral Daily with supper 06/02/19 1453 06/02/19 1456   06/02/19 1700  rilpivirine (EDURANT) tablet 25 mg  Status:  Discontinued     25 mg Oral Daily with supper 06/02/19 1453 06/02/19 1456   06/02/19 1415  fluconazole (DIFLUCAN) IVPB 200 mg  Status:  Discontinued     200 mg 100 mL/hr over 60 Minutes Intravenous Every 24 hours 06/02/19 1402 06/05/19 0834   06/02/19 1100  maraviroc (SELZENTRY) tablet 150 mg     150 mg Oral 2 times daily 06/02/19 0950  06/02/19 1000  cefdinir (OMNICEF) capsule 300 mg     300 mg Oral 2 times daily 06/02/19 0950 06/04/19 2207   06/02/19 1000  fluconazole (DIFLUCAN) tablet 400 mg  Status:  Discontinued     400 mg Oral Daily 06/02/19 0950 06/02/19 1402       Objective: Vitals:   06/18/19 0430 06/18/19 0500 06/18/19 0503 06/18/19 0524  BP: 114/80     Pulse: 88 81  83  Resp: (!) 23 (!) 22 19 20   Temp: 97.7 F (36.5 C)     TempSrc: Axillary     SpO2: 100% 100%  100%  Weight:      Height:        Intake/Output Summary (Last 24 hours) at 06/18/2019 0943 Last data filed at 06/17/2019 2119 Gross per 24 hour  Intake 860 ml  Output 725 ml  Net 135 ml   Filed Weights   06/14/19 0424 06/15/19 0520 06/16/19 0500  Weight: 128.8 kg 130.6 kg 133.4 kg    Examination:  General exam: Appears comfortable -she is alert however will not answer any questions or follow any commands HEENT: PERRLA, oral mucosa moist, no sclera icterus or thrush Respiratory system: Clear to auscultation. Respiratory effort normal. Cardiovascular system: S1 & S2 heard,  No murmurs  Gastrointestinal system: Abdomen soft, non-tender, nondistended. Normal bowel sounds   Central nervous system: Alert -moves upper extremities  Extremities: No cyanosis, clubbing or edema Skin: No rashes or ulcers Psychiatry:   Confused    Data Reviewed: I have personally reviewed following labs and imaging studies  CBC: Recent Labs  Lab 06/12/19 0455 06/13/19 0432 06/16/19 0500 06/17/19 0500 06/17/19 2222  WBC 6.5 8.1 11.3* 12.7* 15.1*  HGB 8.1* 8.4* 8.3* 8.7* 8.8*  HCT 25.5* 27.0* 26.7* 27.5* 27.6*  MCV 97.3 98.9 101.1* 100.7* 99.3  PLT 233 261 258 252 468   Basic Metabolic Panel: Recent Labs  Lab 06/12/19 0455 06/13/19 0432 06/14/19 0511 06/16/19 0500 06/17/19 2222  NA 137 139 142 140 142  K 3.5 3.2* 3.2* 4.0 3.5  CL 102 104 104 103 105  CO2 28 27 27 28 27   GLUCOSE 96 86 72 84 100*  BUN 13 13 14 17 21   CREATININE 1.21* 1.07* 1.07* 1.12* 1.11*  CALCIUM 8.4* 8.4* 8.3* 8.8* 9.5  MG  --  2.0  --   --   --    GFR: Estimated Creatinine Clearance: 74 mL/min (A) (by C-G formula based on SCr of 1.11 mg/dL (H)). Liver Function Tests: Recent Labs  Lab 06/12/19 0455 06/16/19 0500  AST 28 29  ALT 22 21  ALKPHOS 54 53  BILITOT 0.4 0.5  PROT 5.3* 5.3*  ALBUMIN 1.9* 1.9*   No results for input(s): LIPASE, AMYLASE in the last 168 hours. No results for input(s): AMMONIA in the last 168 hours. Coagulation Profile: No results for input(s): INR, PROTIME in the last 168 hours. Cardiac Enzymes: No results for input(s): CKTOTAL, CKMB, CKMBINDEX, TROPONINI in the last 168 hours. BNP (last 3 results) No results for input(s): PROBNP in the last 8760 hours. HbA1C: No results for input(s): HGBA1C in the last 72 hours. CBG: Recent Labs  Lab 06/17/19 2208  GLUCAP 99   Lipid Profile: No results for input(s): CHOL, HDL, LDLCALC, TRIG, CHOLHDL, LDLDIRECT in the last 72 hours. Thyroid Function Tests: No results for input(s): TSH, T4TOTAL, FREET4, T3FREE, THYROIDAB in the last 72 hours. Anemia Panel: No results for input(s): VITAMINB12, FOLATE, FERRITIN, TIBC, IRON, RETICCTPCT in  the last 72 hours. Urine analysis:     Component Value Date/Time   COLORURINE YELLOW 06/01/2019 2225   APPEARANCEUR HAZY (A) 06/01/2019 2225   LABSPEC 1.023 06/01/2019 2225   PHURINE 6.0 06/01/2019 2225   GLUCOSEU NEGATIVE 06/01/2019 2225   GLUCOSEU NEG mg/dL 07/23/2006 2113   HGBUR NEGATIVE 06/01/2019 [place] 06/01/2019 [place] 06/01/2019 2225   PROTEINUR 30 (A) 06/01/2019 2225   UROBILINOGEN 1 07/23/2006 2113   NITRITE NEGATIVE 06/01/2019 2225   LEUKOCYTESUR NEGATIVE 06/01/2019 2225   Sepsis Labs: @LABRCNTIP (procalcitonin:4,lacticidven:4) ) Recent Results (from the past 240 hour(s))  Culture, blood (Routine X 2) w Reflex to ID Panel     Status: None   Collection Time: 06/09/19  7:50 AM   Specimen: BLOOD  Result Value Ref Range Status   Specimen Description BLOOD LEFT ANTECUBITAL  Final   Special Requests   Final    BOTTLES DRAWN AEROBIC ONLY Blood Culture results may not be optimal due to an inadequate volume of blood received in culture bottles   Culture   Final    NO GROWTH 5 DAYS Performed at [facility] [address] [place], [place] [address]    Report Status 06/14/2019 FINAL  Final  Culture, blood (Routine X 2) w Reflex to ID Panel     Status: None   Collection Time: 06/09/19  7:55 AM   Specimen: BLOOD LEFT HAND  Result Value Ref Range Status   Specimen Description BLOOD LEFT HAND  Final   Special Requests   Final    BOTTLES DRAWN AEROBIC ONLY Blood Culture results may not be optimal due to an inadequate volume of blood received in culture bottles   Culture   Final    NO GROWTH 5 DAYS Performed at [facility], [place] [address], [place], [place] [address]    Report Status 06/14/2019 FINAL  Final  MRSA PCR Screening     Status: None   Collection Time: 06/16/19  5:30 PM   Specimen: Nasal Mucosa; Nasopharyngeal  Result Value Ref Range Status   MRSA by PCR NEGATIVE NEGATIVE Final    Comment:        The GeneXpert MRSA Assay (FDA approved for  NASAL specimens only), is one component of a comprehensive MRSA colonization surveillance program. It is not intended to diagnose MRSA infection nor to guide or monitor treatment for MRSA infections. Performed at [facility], [place] [address] [place], [place] [address]          Radiology Studies: DG CHEST PORT 1 VIEW  Result Date: 06/18/2019 CLINICAL DATA:  Hypoxia EXAM: PORTABLE CHEST 1 VIEW COMPARISON:  06/13/2019 FINDINGS: Right Port-A-Cath remains in place, unchanged. Cardiomegaly. Diffuse right lung and patchy left lung airspace disease again noted, not significantly changed. Possible right effusion. No acute bony abnormality. IMPRESSION: No significant change since prior study. Electronically Signed   By: Rolm Baptise M.D.   On: 06/18/2019 09:17      Scheduled Meds: . Chlorhexidine Gluconate Cloth  6 each Topical Daily  . darunavir-cobicistat  1 tablet Oral QHS  . dextrose  12.5 g Intravenous STAT  . dolutegravir  50 mg Oral QHS   And  . rilpivirine  25 mg Oral QHS  . enoxaparin (LOVENOX) injection  80 mg Subcutaneous Q12H  . feeding supplement (ENSURE ENLIVE)  237 mL Oral TID BM  . levothyroxine  200 mcg Oral QAC breakfast  . maraviroc  150  mg Oral BID  . multivitamin with minerals  1 tablet Oral Daily   Continuous Infusions: . sodium chloride 10 mL/hr at 06/10/19 2141  . ceFEPime (MAXIPIME) IV 2 g (06/18/19 0609)     LOS: 15 days      Debbe Odea, MD [facility] Pager: [email or web address] Password [facility] 06/18/2019, 9:43 AM

## 2019-06-18 NOTE — Progress Notes (Signed)
Neurologist called this NP with recommendations from his consult. NP called ID at neuro's request and ID agreed with choice of antibiotics. Orders placed.  [email or web address], NP [facility]

## 2019-06-18 NOTE — Telephone Encounter (Signed)
Adjusted appts per 12/23 sch message and staff message with GM . Unable to reach pt . Left vmail with appt changes

## 2019-06-18 NOTE — Progress Notes (Signed)
PT Cancellation Note  Patient Details Name: Kayla Price MRN: [id number] DOB: [birth year]   Cancelled Treatment:    Reason Eval/Treat Not Completed: Medical issues which prohibited therapy per chart review patient with rapid response last night due to confusion, was briefly placed back on BiPAP. RN reports she is still very confused and recommends holding PT today due to medical status. Will continue to follow acutely.    Windell Norfolk, DPT, PN1   Supplemental Physical Therapist [facility]    Pager [phone] Acute Rehab Office [phone]

## 2019-06-18 NOTE — Progress Notes (Signed)
EEG complete - results pending 

## 2019-06-18 NOTE — Progress Notes (Signed)
Started assessing patient and was not able to express her needs.  Patient was not able to follow some commands, very little movement in all extremities.  Patient was not able to sip water from a cup to take medication.  She looked restless, moaning to express self.  Patient was able to speak and drink the night before and nothing was given on report of any changes.  When receiving report in the room this evening, patient was able to say hello.  Rapid response was called to assess patient, EKG was done, and CBG was taken.  MD was notified as well, per Dr. Kennon Holter, patient can be placed on BIPAP but no ABGs will be done.  Mindy from [facility] stated if any other changes to call her back.

## 2019-06-18 NOTE — Progress Notes (Addendum)
Pharmacy Antibiotic Note  Kayla Price is a 63 y.o. female with AMS on cefepime for PNA and to re-start vancomycin for possible meningitis. She is also on acyclovir and cefepime was changed to rocephin and ampicilln -WBC= 14.3, afebrile, SCr= 1.0 -was on vancomycin 1500mg  IV q24h (last dose was 12/23 at 10a)   Plan: -Change rocephin to 2gm IV q12h -Change ampicillin to 2gm IV q4 -Vancomycin 1000mg  IV q12h (dose change due to concern of meningitis) -Will follow renal function, cultures and clinical progress     Height: 5\' 7"  (170.2 cm) Weight: 294 lb (133.4 kg) IBW/kg (Calculated) : 61.6  Temp (24hrs), Avg:97.7 F (36.5 C), Min:97.7 F (36.5 C), Max:97.7 F (36.5 C)  Recent Labs  Lab 06/13/19 0432 06/14/19 0511 06/16/19 0500 06/17/19 0500 06/17/19 2222 06/18/19 1030  WBC 8.1  --  11.3* 12.7* 15.1* 14.3*  CREATININE 1.07* 1.07* 1.12*  --  1.11* 1.03*    Estimated Creatinine Clearance: 79.7 mL/min (A) (by C-G formula based on SCr of 1.03 mg/dL (H)).    Allergies  Allergen Reactions  . Lisinopril Anaphylaxis, Swelling and Other (See Comments)    Swelling of tongue and mouth 11/05/16- tolerates Olmesartan  . Pepcid [Famotidine] Other (See Comments)    PPI H2, BLOCKERS LOWER GASTRIC PH WHICH WOULD LEAD TO SUBTHERAPEUTIC RILPIVIRINE LEVELS AND POTENTIAL VIROLOGICAL FAILURE WITH RESISTANCE  . Prilosec [Omeprazole] Other (See Comments)    PPI H2, BLOCKERS LOWER GASTRIC PH WHICH WOULD LEAD TO SUBTHERAPEUTIC RILPIVIRINE LEVELS AND POTENTIAL VIROLOGICAL FAILURE WITH RESISTANCE  . Tums [Calcium Carbonate Antacid] Other (See Comments)    TUMS ANTACIDS CAN LOWER GASTRIC PH WHICH COULD  LEAD TO SUBTHERAPEUTIC RILPIVIRINE LEVELS AND POTENTIAL VIROLOGICAL FAILURE WITH RESISTANCE TUMS CAN BE GIVEN BUT NEED CONSULT WITH ID PHARMACY RE TIMING. I PREFER HER TO AVOID ALL TOGETHER    Antimicrobials this admission: Rocephin 12/15 >> 12/18 Doxy 12/15 >> 12/18 Valacyclovir 12/11 >>  12/18 Vancomycin 12/18 >>12/23; 12/24>> Cefepime 12/18 >> 12/24 Rocephin 12/24>>  Hildred Laser, PharmD Clinical Pharmacist **Pharmacist phone directory can now be found on [email or web address] ([email or web address]).  Listed under [place].

## 2019-06-19 ENCOUNTER — Inpatient Hospital Stay (HOSPITAL_COMMUNITY): Payer: Medicare HMO

## 2019-06-19 DIAGNOSIS — G9340 Encephalopathy, unspecified: Secondary | ICD-10-CM

## 2019-06-19 LAB — URINE CULTURE: Culture: NO GROWTH

## 2019-06-19 LAB — CBC
HCT: 25.6 % — ABNORMAL LOW (ref 36.0–46.0)
Hemoglobin: 8 g/dL — ABNORMAL LOW (ref 12.0–15.0)
MCH: 32.1 pg (ref 26.0–34.0)
MCHC: 31.3 g/dL (ref 30.0–36.0)
MCV: 102.8 fL — ABNORMAL HIGH (ref 80.0–100.0)
Platelets: 230 10*3/uL (ref 150–400)
RBC: 2.49 MIL/uL — ABNORMAL LOW (ref 3.87–5.11)
RDW: 23.4 % — ABNORMAL HIGH (ref 11.5–15.5)
WBC: 14.1 10*3/uL — ABNORMAL HIGH (ref 4.0–10.5)
nRBC: 0.4 % — ABNORMAL HIGH (ref 0.0–0.2)

## 2019-06-19 LAB — GLUCOSE, CAPILLARY: Glucose-Capillary: 85 mg/dL (ref 70–99)

## 2019-06-19 LAB — BASIC METABOLIC PANEL
Anion gap: 7 (ref 5–15)
BUN: 19 mg/dL (ref 8–23)
CO2: 28 mmol/L (ref 22–32)
Calcium: 8.8 mg/dL — ABNORMAL LOW (ref 8.9–10.3)
Chloride: 107 mmol/L (ref 98–111)
Creatinine, Ser: 0.95 mg/dL (ref 0.44–1.00)
GFR calc Af Amer: >60 mL/min (ref >60)
GFR calc non Af Amer: >60 mL/min (ref >60)
Glucose, Bld: 107 mg/dL — ABNORMAL HIGH (ref 70–99)
Potassium: 3.4 mmol/L — ABNORMAL LOW (ref 3.5–5.1)
Sodium: 142 mmol/L (ref 135–145)

## 2019-06-19 LAB — T4, FREE: Free T4: 0.7 ng/dL (ref 0.61–1.12)

## 2019-06-19 LAB — HEPARIN ANTI-XA: Heparin LMW: 1.39 IU/mL

## 2019-06-19 LAB — RPR: RPR Ser Ql: NONREACTIVE

## 2019-06-19 LAB — TSH: TSH: 23.171 u[IU]/mL — ABNORMAL HIGH (ref 0.350–4.500)

## 2019-06-19 MED ORDER — DEXTROSE 5 % IV SOLN
800.0000 mg | Freq: Three times a day (TID) | INTRAVENOUS | Status: DC
  Administered 2019-06-19: 12:00:00 800 mg via INTRAVENOUS
  Filled 2019-06-19 (×2): qty 16

## 2019-06-19 MED ORDER — LORAZEPAM 2 MG/ML IJ SOLN
0.5000 mg | Freq: Once | INTRAMUSCULAR | Status: AC
  Administered 2019-06-19: 14:00:00 0.5 mg via INTRAVENOUS

## 2019-06-19 NOTE — Progress Notes (Signed)
[place] for Lovenox Indication: DVT  Allergies  Allergen Reactions  . Lisinopril Anaphylaxis, Swelling and Other (See Comments)    Swelling of tongue and mouth 11/05/16- tolerates Olmesartan  . Pepcid [Famotidine] Other (See Comments)    PPI H2, BLOCKERS LOWER GASTRIC PH WHICH WOULD LEAD TO SUBTHERAPEUTIC RILPIVIRINE LEVELS AND POTENTIAL VIROLOGICAL FAILURE WITH RESISTANCE  . Prilosec [Omeprazole] Other (See Comments)    PPI H2, BLOCKERS LOWER GASTRIC PH WHICH WOULD LEAD TO SUBTHERAPEUTIC RILPIVIRINE LEVELS AND POTENTIAL VIROLOGICAL FAILURE WITH RESISTANCE  . Tums [Calcium Carbonate Antacid] Other (See Comments)    TUMS ANTACIDS CAN LOWER GASTRIC PH WHICH COULD  LEAD TO SUBTHERAPEUTIC RILPIVIRINE LEVELS AND POTENTIAL VIROLOGICAL FAILURE WITH RESISTANCE TUMS CAN BE GIVEN BUT NEED CONSULT WITH ID PHARMACY RE TIMING. I PREFER HER TO AVOID ALL TOGETHER    Patient Measurements: Height: 5\' 7"  (170.2 cm) Weight: 294 lb (133.4 kg) IBW/kg (Calculated) : 61.6  Vital Signs: Pulse Rate: 94 (12/24 1950)  Labs: Recent Labs    06/16/19 0500 06/16/19 2117 06/17/19 0500 06/17/19 2222 06/18/19 1030 06/18/19 1644 06/19/19 0003  HGB 8.3*  --  8.7* 8.8* 8.3*  --   --   HCT 26.7*  --  27.5* 27.6* 26.8*  --   --   PLT 258  --  252 258 234  --   --   HEPRLOWMOCWT  --  2.00  --   --   --  1.75 1.39  CREATININE 1.12*  --   --  1.11* 1.03*  --   --     Estimated Creatinine Clearance: 79.7 mL/min (A) (by C-G formula based on SCr of 1.03 mg/dL (H)).   [name] Pharmacy consulted for Lovenox therapy.  -low molecular weight heparin level= 1.75 at 4:45pm Level at 00:03 1.39 Est  t1/2 ~ 20 hours   Goal of Therapy:  Anti-Xa lovenox level 0.6-1.2 drawn 4 hrs after dose. Monitor platelets by anticoagulation protocol: Yes   Plan:  -hold  lovenox for now -Check a low molecular weight heparin level at 22:00 today -Once low molecular weight heparin level is around 0.5, will consider resuming at 30% reduction from the original dose  Thanks for allowing pharmacy to be a part of this patient's care.  Excell Seltzer, PharmD Clinical Pharmacist

## 2019-06-19 NOTE — Progress Notes (Signed)
RT NOTE: RN called RT bedside to look at patient because of sat. RT arrived and patient was sating in the 90's on 6L [place]. Patient dropped to 90% with RT at bedside. RT placed patient on HFNC salter 8L and patient's sats increased to the high 90's. Patient seems comfortable on HFNC salter. RT will continue to monitor as needed.

## 2019-06-19 NOTE — Progress Notes (Signed)
Subjective: She has had some improvement, denies headache  Exam: Vitals:   06/19/19 0522 06/19/19 1400  BP: 115/68   Pulse: 90 (!) 115  Resp: (!) 25 18  Temp: 97.7 F (36.5 C)   SpO2: 99% (!) 88%   Gen: In bed, NAD Resp: non-labored breathing, no acute distress Abd: soft, nt  Neuro: MS: Awake, answering simple questions, but is dysarthric and speaking very softly so it is difficult to understand her.  She does use yes/no appropriately and follows commands CN: Pupils equal round and reactive, extraocular movements intact Motor: Moves all extremities to command Sensory: Intact light touch  Pertinent Labs: TSH 19 Slightly elevated creatinine 1.3  Impression: 63 year old female with acute encephalopathy in the setting of getting cefepime 2 g every 8 hours with borderline renal function.    I doubt that this represents HSV encephalitis given that HSV-2 usually causes meningitis, also she had already been treated with valacyclovir.  She has no signs of meningismus or headache.  Especially with her demonstrating improvement, I think that bacterial infection is also very unlikely.    With her EEG revealing a characteristic pattern, coupled with her improvement, I suspect that this represents cefepime neurotoxicity.  Though I think seizure is less likely, until her mental status continues to improve and EEG is repeated, I would favor continuing Keppra.  I would favor change her antibiotics to  Recommendations: 1) continue Keppra 500 twice daily for now 2) no need for encephalitis/meningitis antibiotics 3) avoid cefepime 4) elevated TSH per internal medicine 5) neurology will continue to follow   Roland Rack, MD [facility] [phone]  If 7pm- 7am, please page neurology on call as listed in [place].

## 2019-06-19 NOTE — Progress Notes (Signed)
Telemetry called to report patient's HR in 140's, went to assess patient and found her to be restless and agitated. O2 in high 70's-80's. Respiratory called and increased O2 to 8L. Notified MD. Received new order for 0.5 Ativan and chest xray. Patient's O2 now in high 90's, HR low 100's and she is resting.

## 2019-06-19 NOTE — Progress Notes (Addendum)
PROGRESS NOTE    Kayla Price   TGY:[id number]  DOB: [birth year]  DOA: 06/01/2019 PCP: Lauree Chandler, NP   Brief Narrative:  Kayla Price 63 year old female with PMH of metastatic breast cancer with metastasis to brain and right pleura, s/p whole brain radiation, HIV, CKD stage II, recently hospitalized 05/27/2019-05/31/2023 acute kidney injury, hypotension and superficial ulcerations of the groin/perineum who presented the next day of discharge 06/01/2019 due to worsening drainage from her known skin ulcerations along with generalized weakness.  She was admitted for hypotension, acute kidney injury and ulcers of her groin and perineum.  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  Hospital course complicated by community-acquired pneumonia, extensive right lower extremity DVT, acute hypoxic respiratory failure.  She was transferred to progressive care unit on 12/17, PCCM consulted, BiPAP initiated, primary oncologist and PMT consulted, since she was doing so poorly, discussions regarding transitioning to comfort care were ongoing.    Subjective: Events of last night noted.  Apparently the patient was confused last night and a little lethargic and thus was placed on BiPAP overnight.  This morning we have removed the BiPAP.  She remains confused and is not answering questions or following commands.  Assessment & Plan:   Principal Problem:  Acute encephalopathy-likely metabolic -We will follow-up on blood work this morning-I will also check a UA as she has had a Foley catheter for a number of days now-repeat chest x-ray does not show progression of infiltrates and oxygen level is in the 90s and thus I feel that her pneumonia is improving. - Addendum- UA does not show many bacteria- -  MRI shows multiple mets and edema which is improving - EEG showing severe diffuse encephalopathy - Neuro feels that she may have Cefepime toxicity-  will d/c Antibiotics that were started for possible meningitis. 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 -This was the reason for her admission -She has completed a course of Valtrex -Continue to follow    AKI (acute kidney injury)  -CKD 2/3  -Renal ultrasound 12/3 was unrevealing -This has resolved  Elevated TSH - TSH checked yesterday was 19.709 - will recheck today along with Free T4 and Ts  Hypoxic respiratory failure secondary to community-acquired pneumonia -Patient required treatment with BiPAP starting on 12/18 and has been weaned down now to nasal cannula - she appears to be comfortable at rest - Continue to wean oxygen as able - She is being treated with IV antibiotics including Vanc and cefepime   -  MRSA PCR screen was negative and therefore vancomycin was discontinued on 12/23   - Due to suspected Cefepime toxicity, have d/c'd Cefepime as well- she has received 7 days of Vanc/Cefepime - she still has a RLL infiltrate but we have been able to wean down O2  HIV -Relatively well controlled with a CD4 count greater than 400 on 10/26 -She will need to follow-up with ID as outpatient in 4 to 6 weeks - I spoke with Dr Drucilla Schmidt on 12/24 who recommends that I stop Selzentry  Extensive left leg DVT -Has been placed on Lovenox and it is planned for her to continue Lovenox as outpatient  Metastatic cancer-mets to the brain and right pleural space - is status post post whole brain radiation 11/20, chemotherapy and right modified radical mastectomy -There is a plan for repeat PET scan as outpatient-she  is followed by Dr. Jana Hakim who has been seeing her in the hospital as well  Time spent in minutes: 35 DVT prophylaxis: Lovenox Code Status: DO NOT RESUSCITATE Family Communication:  Disposition Plan: To be determined-need to determine the cause for her confusion today Consultants:   Oncology  Critical  care  Infectious disease  Palliative care Procedures:   BiPAP  Catheter Antimicrobials:  Anti-infectives (From admission, onward)   Start     Dose/Rate Route Frequency Ordered Stop   06/19/19 1000  acyclovir (ZOVIRAX) 800 mg in dextrose 5 % 150 mL IVPB     800 mg 166 mL/hr over 60 Minutes Intravenous Every 8 hours 06/19/19 0627     06/19/19 0000  ampicillin (OMNIPEN) 1 g in sodium chloride 0.9 % 100 mL IVPB  Status:  Discontinued     1 g 300 mL/hr over 20 Minutes Intravenous Every 6 hours 06/18/19 2117 06/18/19 2155   06/18/19 2300  ampicillin (OMNIPEN) 2 g in sodium chloride 0.9 % 100 mL IVPB     2 g 300 mL/hr over 20 Minutes Intravenous Every 4 hours 06/18/19 2155     06/18/19 2230  vancomycin (VANCOCIN) IVPB 1000 mg/200 mL premix     1,000 mg 200 mL/hr over 60 Minutes Intravenous Every 12 hours 06/18/19 2147     06/18/19 2230  cefTRIAXone (ROCEPHIN) 2 g in sodium chloride 0.9 % 100 mL IVPB     2 g 200 mL/hr over 30 Minutes Intravenous Every 12 hours 06/18/19 2147     06/18/19 2200  acyclovir (ZOVIRAX) 800 mg in dextrose 5 % 150 mL IVPB  Status:  Discontinued     800 mg 166 mL/hr over 60 Minutes Intravenous Every 8 hours 06/18/19 2119 06/19/19 0627   06/18/19 2200  cefTRIAXone (ROCEPHIN) 1 g in sodium chloride 0.9 % 100 mL IVPB  Status:  Discontinued     1 g 200 mL/hr over 30 Minutes Intravenous Every 24 hours 06/18/19 2125 06/18/19 2147   06/18/19 2130  cefTRIAXone (ROCEPHIN) 1 g in sodium chloride 0.9 % 100 mL IVPB  Status:  Discontinued     1 g 200 mL/hr over 30 Minutes Intravenous Every 24 hours 06/18/19 2117 06/18/19 2125   06/15/19 1000  vancomycin (VANCOREADY) IVPB 1500 mg/300 mL  Status:  Discontinued     1,500 mg 150 mL/hr over 120 Minutes Intravenous Every 24 hours 06/14/19 1017 06/17/19 1139   06/14/19 1800  vancomycin (VANCOREADY) IVPB 1500 mg/300 mL  Status:  Discontinued     1,500 mg 150 mL/hr over 120 Minutes Intravenous Every 24 hours 06/14/19 0924 06/14/19  1017   06/14/19 1400  ceFEPIme (MAXIPIME) 2 g in sodium chloride 0.9 % 100 mL IVPB  Status:  Discontinued     2 g 200 mL/hr over 30 Minutes Intravenous Every 8 hours 06/14/19 0924 06/18/19 2117   06/13/19 1800  vancomycin (VANCOREADY) IVPB 1250 mg/250 mL  Status:  Discontinued     1,250 mg 166.7 mL/hr over 90 Minutes Intravenous Every 24 hours 06/12/19 1506 06/14/19 0924   06/13/19 1130  ceFEPIme (MAXIPIME) 2 g in sodium chloride 0.9 % 100 mL IVPB  Status:  Discontinued     2 g 200 mL/hr over 30 Minutes Intravenous Every 12 hours 06/13/19 1159 06/14/19 0924   06/12/19 1800  vancomycin (VANCOREADY) IVPB 1750 mg/350 mL     1,750 mg 175 mL/hr over 120 Minutes Intravenous  Once 06/12/19 1506 06/12/19 1940   06/12/19 1600  ceFEPIme (MAXIPIME) 2  g in sodium chloride 0.9 % 100 mL IVPB  Status:  Discontinued     2 g 200 mL/hr over 30 Minutes Intravenous Every 12 hours 06/12/19 1506 06/13/19 1159   06/10/19 0600  cefTRIAXone (ROCEPHIN) 1 g in sodium chloride 0.9 % 100 mL IVPB  Status:  Discontinued     1 g 200 mL/hr over 30 Minutes Intravenous Every 24 hours 06/09/19 0732 06/12/19 1335   06/09/19 0800  doxycycline (VIBRAMYCIN) 100 mg in sodium chloride 0.9 % 250 mL IVPB  Status:  Discontinued     100 mg 125 mL/hr over 120 Minutes Intravenous Every 12 hours 06/09/19 0735 06/12/19 1335   06/09/19 0745  cefTRIAXone (ROCEPHIN) 1 g in sodium chloride 0.9 % 100 mL IVPB     1 g 200 mL/hr over 30 Minutes Intravenous  Once 06/09/19 0738 06/09/19 0924   06/05/19 2200  valACYclovir (VALTREX) tablet 1,000 mg     1,000 mg Oral 2 times daily 06/05/19 1418 06/12/19 1101   06/05/19 1000  valACYclovir (VALTREX) tablet 1,000 mg  Status:  Discontinued     1,000 mg Oral 3 times daily 06/05/19 0834 06/05/19 1418   06/02/19 2200  darunavir-cobicistat (PREZCOBIX) 800-150 MG per tablet 1 tablet     1 tablet Oral Daily at bedtime 06/02/19 0950     06/02/19 2200  dolutegravir (TIVICAY) tablet 50 mg     50 mg Oral Daily  at bedtime 06/02/19 1455     06/02/19 2200  rilpivirine (EDURANT) tablet 25 mg     25 mg Oral Daily at bedtime 06/02/19 1455     06/02/19 1700  dolutegravir (TIVICAY) tablet 50 mg  Status:  Discontinued     50 mg Oral Daily with supper 06/02/19 1453 06/02/19 1456   06/02/19 1700  rilpivirine (EDURANT) tablet 25 mg  Status:  Discontinued     25 mg Oral Daily with supper 06/02/19 1453 06/02/19 1456   06/02/19 1415  fluconazole (DIFLUCAN) IVPB 200 mg  Status:  Discontinued     200 mg 100 mL/hr over 60 Minutes Intravenous Every 24 hours 06/02/19 1402 06/05/19 0834   06/02/19 1100  maraviroc (SELZENTRY) tablet 150 mg  Status:  Discontinued     150 mg Oral 2 times daily 06/02/19 0950 06/18/19 1443   06/02/19 1000  cefdinir (OMNICEF) capsule 300 mg     300 mg Oral 2 times daily 06/02/19 0950 06/04/19 2207   06/02/19 1000  fluconazole (DIFLUCAN) tablet 400 mg  Status:  Discontinued     400 mg Oral Daily 06/02/19 0950 06/02/19 1402       Objective: Vitals:   06/18/19 1950 06/19/19 0500 06/19/19 0521 06/19/19 0522  BP:    115/68  Pulse: 94   90  Resp: 18  19 (!) 25  Temp:    97.7 F (36.5 C)  TempSrc:    Axillary  SpO2:    99%  Weight:  131.5 kg    Height:  5\' 7"  (1.702 m)      Intake/Output Summary (Last 24 hours) at 06/19/2019 1335 Last data filed at 06/19/2019 0600 Gross per 24 hour  Intake --  Output 500 ml  Net -500 ml   Filed Weights   06/15/19 0520 06/16/19 0500 06/19/19 0500  Weight: 130.6 kg 133.4 kg 131.5 kg    Examination:  General exam: Appears comfortable -she is alert however will not answer any questions or follow any commands HEENT: PERRLA, oral mucosa moist, no sclera icterus or thrush  Respiratory system: Clear to auscultation. Respiratory effort normal. Cardiovascular system: S1 & S2 heard,  No murmurs  Gastrointestinal system: Abdomen soft, non-tender, nondistended. Normal bowel sounds   Central nervous system: Alert -moves upper extremities Extremities:  No cyanosis, clubbing or edema Skin: No rashes or ulcers Psychiatry:   Confused    Data Reviewed: I have personally reviewed following labs and imaging studies  CBC: Recent Labs  Lab 06/13/19 0432 06/16/19 0500 06/17/19 0500 06/17/19 2222 06/18/19 1030  WBC 8.1 11.3* 12.7* 15.1* 14.3*  HGB 8.4* 8.3* 8.7* 8.8* 8.3*  HCT 27.0* 26.7* 27.5* 27.6* 26.8*  MCV 98.9 101.1* 100.7* 99.3 100.4*  PLT 261 258 252 258 782   Basic Metabolic Panel: Recent Labs  Lab 06/13/19 0432 06/14/19 0511 06/16/19 0500 06/17/19 2222 06/18/19 1030 06/19/19 0358  NA 139 142 140 142 141 142  K 3.2* 3.2* 4.0 3.5 3.2* 3.4*  CL 104 104 103 105 105 107  CO2 27 27 28 27 27 28   GLUCOSE 86 72 84 100* 91 107*  BUN 13 14 17 21 19 19   CREATININE 1.07* 1.07* 1.12* 1.11* 1.03* 0.95  CALCIUM 8.4* 8.3* 8.8* 9.5 9.0 8.8*  MG 2.0  --   --   --   --   --    GFR: Estimated Creatinine Clearance: 85.7 mL/min (by C-G formula based on SCr of 0.95 mg/dL). Liver Function Tests: Recent Labs  Lab 06/16/19 0500  AST 29  ALT 21  ALKPHOS 53  BILITOT 0.5  PROT 5.3*  ALBUMIN 1.9*   No results for input(s): LIPASE, AMYLASE in the last 168 hours. Recent Labs  Lab 06/18/19 1721  AMMONIA 19   Coagulation Profile: No results for input(s): INR, PROTIME in the last 168 hours. Cardiac Enzymes: No results for input(s): CKTOTAL, CKMB, CKMBINDEX, TROPONINI in the last 168 hours. BNP (last 3 results) No results for input(s): PROBNP in the last 8760 hours. HbA1C: No results for input(s): HGBA1C in the last 72 hours. CBG: Recent Labs  Lab 06/17/19 2208 06/19/19 0739  GLUCAP 99 85   Lipid Profile: No results for input(s): CHOL, HDL, LDLCALC, TRIG, CHOLHDL, LDLDIRECT in the last 72 hours. Thyroid Function Tests: Recent Labs    06/18/19 1644  TSH 19.709*   Anemia Panel: Recent Labs    06/18/19 1644  VITAMINB12 2,112*   Urine analysis:    Component Value Date/Time   COLORURINE AMBER (A) 06/18/2019 1112    APPEARANCEUR CLOUDY (A) 06/18/2019 1112   LABSPEC 1.020 06/18/2019 1112   PHURINE 5.0 06/18/2019 1112   GLUCOSEU NEGATIVE 06/18/2019 1112   GLUCOSEU NEG mg/dL 07/23/2006 2113   HGBUR MODERATE (A) 06/18/2019 1112   BILIRUBINUR NEGATIVE 06/18/2019 1112   KETONESUR 5 (A) 06/18/2019 1112   PROTEINUR 100 (A) 06/18/2019 1112   UROBILINOGEN 1 07/23/2006 2113   NITRITE NEGATIVE 06/18/2019 1112   LEUKOCYTESUR NEGATIVE 06/18/2019 1112   Sepsis Labs: @LABRCNTIP (procalcitonin:4,lacticidven:4) ) Recent Results (from the past 240 hour(s))  MRSA PCR Screening     Status: None   Collection Time: 06/16/19  5:30 PM   Specimen: Nasal Mucosa; Nasopharyngeal  Result Value Ref Range Status   MRSA by PCR NEGATIVE NEGATIVE Final    Comment:        The GeneXpert MRSA Assay (FDA approved for NASAL specimens only), is one component of a comprehensive MRSA colonization surveillance program. It is not intended to diagnose MRSA infection nor to guide or monitor treatment for MRSA infections. Performed at [facility],  [address], [place], [place] [address]   Culture, Urine     Status: None   Collection Time: 06/18/19 11:12 AM   Specimen: Urine, Random  Result Value Ref Range Status   Specimen Description URINE, RANDOM  Final   Special Requests NONE  Final   Culture   Final    NO GROWTH Performed at [facility], [place] [address], [place], [place] [address]    Report Status 06/19/2019 FINAL  Final         Radiology Studies: MR BRAIN W WO CONTRAST  Result Date: 06/18/2019 CLINICAL DATA:  Encephalopathy. Metastatic breast cancer. Known brain metastases. EXAM: MRI HEAD WITHOUT AND WITH CONTRAST TECHNIQUE: Multiplanar, multiecho pulse sequences of the brain and surrounding structures were obtained without and with intravenous contrast. CONTRAST:  48mL GADAVIST GADOBUTROL 1 MMOL/ML IV SOLN COMPARISON:  MR head without and with contrast 04/28/2019 FINDINGS: Brain: The right occipital  lesion has decreased somewhat. There is mostly peripheral enhancement with a nodular component posteriorly. The lesion now measures 2.4 x 2.6 x 2.1 cm. The lesion previously measured 3.4 x 3.1 x 2.7 cm in the same dimensions. A left occipital lesion previously measured 13 x 11 mm. It now measures 13 x 9 mm. A more lateral lesion in the left occipital lobe has decreased from 9 mm to 6.5 mm. A lesion in the lateral left temporal lobe has decreased from 6.53.5 mm on image 23 of series 10. A medial right temporal lobe lesion on image 25 has decreased from 7.5 mm to 6 mm. A lesion in the left paramedian superior cerebellar vermis now measures 4 mm. Previously measured 7 mm. A lesion in the left paramedian pons previously measured 4.5 mm. It is now punctate in size. A lesion in the anterior left frontal lobe white matter on image 36 as decreased from 7.5 mm to 3.5 mm. A punctate lesion in the left corona radiata previously measured 4 mm. Punctate lesion is again noted in the posterior left frontal lobe on image 45. No new enhancing lesions are present. Vasogenic edema is most prominent about the occipital lobe lesions. This has decreased since the prior exam. No acute infarct or hemorrhage is present. Ventricles are of normal size. No significant extraaxial fluid collection is present. Vascular: Flow is present in the major intracranial arteries. Skull and upper cervical spine: Extensive marrow signal changes in the calvarium ir consistent with osseous metastases. Heterogeneous marrow signal is present in the upper cervical spine. Sinuses/Orbits: Minimal fluid is present in the left maxillary sinus. A fluid level is present in the left sphenoid sinus. Left ethmoid air cells are partially opacified. The paranasal sinuses and mastoid air cells are otherwise clear. IMPRESSION: 1. Decrease in size of multiple brain metastases identified on the prior exam. No new lesions are present. 2. Decreased vasogenic edema, still  predominantly occipital lobes. 3. Diffuse marrow signal changes in the skull concerning for metastatic disease. 4. Left maxillary and sphenoid sinus disease. Electronically Signed   By: San Morelle M.D.   On: 06/18/2019 18:44   DG CHEST PORT 1 VIEW  Result Date: 06/18/2019 CLINICAL DATA:  Hypoxia EXAM: PORTABLE CHEST 1 VIEW COMPARISON:  06/13/2019 FINDINGS: Right Port-A-Cath remains in place, unchanged. Cardiomegaly. Diffuse right lung and patchy left lung airspace disease again noted, not significantly changed. Possible right effusion. No acute bony abnormality. IMPRESSION: No significant change since prior study. Electronically Signed   By: Rolm Baptise M.D.   On: 06/18/2019 09:17  EEG adult  Result Date: 06/18/2019 Lora Havens, MD     06/18/2019  8:55 PM Patient Name: Kayla Price MRN: [id number] Epilepsy Attending: Lora Havens Referring Physician/Provider: Dr Debbe Odea Date: 06/18/2019 Duration: 24.56 mins Patient history: 63yo F with HIV, brain mets and now with ams. EEG to evaluate for seizure Level of alertness: awake AEDs during EEG study: None Technical aspects: This EEG study was done with scalp electrodes positioned according to the 10-20 International system of electrode placement. Electrical activity was acquired at a sampling rate of 500Hz  and reviewed with a high frequency filter of 70Hz  and a low frequency filter of 1Hz . EEG data were recorded continuously and digitally stored. DESCRIPTION: During awake state, no clear posterior dominant rhythm. EEG also showed continuous generalized  3-5hz  theta-delta slowing. Triphasic waves, generalized, maximal bifrontal, at 2Hz  were also noted. Hyperventilation and photic stimulation were not performed. ABNORMALITY - Continuous slow, generalized - Triphasic waves, generalized IMPRESSION: This study is  Suggestive of severe diffuse encephalopathy, non specific to etiology but could be secondary to toxic-metabolic causes,  cepefime toxicity. No seizures or definite epileptiform discharges were seen throughout the recording. Priyanka Barbra Sarks      Scheduled Meds: . Chlorhexidine Gluconate Cloth  6 each Topical Daily  . darunavir-cobicistat  1 tablet Oral QHS  . dolutegravir  50 mg Oral QHS   And  . rilpivirine  25 mg Oral QHS  . feeding supplement (ENSURE ENLIVE)  237 mL Oral TID BM  . levothyroxine  200 mcg Oral QAC breakfast  . multivitamin with minerals  1 tablet Oral Daily   Continuous Infusions: . sodium chloride Stopped (06/19/19 0514)  . acyclovir 800 mg (06/19/19 1218)  . ampicillin (OMNIPEN) IV 2 g (06/19/19 1329)  . cefTRIAXone (ROCEPHIN)  IV 2 g (06/19/19 0958)  . dextrose 5 % and 0.9 % NaCl with KCl 40 mEq/L 75 mL/hr at 06/19/19 0514  . levETIRAcetam 500 mg (06/19/19 0941)  . thiamine injection 250 mg (06/19/19 1145)  . vancomycin 1,000 mg (06/19/19 0517)     LOS: 16 days      Debbe Odea, MD [facility] Pager: [email or web address] Password [email or web address] 06/19/2019, 1:35 PM

## 2019-06-20 LAB — BASIC METABOLIC PANEL
Anion gap: 7 (ref 5–15)
BUN: 17 mg/dL (ref 8–23)
CO2: 26 mmol/L (ref 22–32)
Calcium: 8.6 mg/dL — ABNORMAL LOW (ref 8.9–10.3)
Chloride: 112 mmol/L — ABNORMAL HIGH (ref 98–111)
Creatinine, Ser: 0.98 mg/dL (ref 0.44–1.00)
GFR calc Af Amer: >60 mL/min (ref >60)
GFR calc non Af Amer: >60 mL/min (ref >60)
Glucose, Bld: 146 mg/dL — ABNORMAL HIGH (ref 70–99)
Potassium: 3.6 mmol/L (ref 3.5–5.1)
Sodium: 145 mmol/L (ref 135–145)

## 2019-06-20 LAB — CBC
HCT: 29 % — ABNORMAL LOW (ref 36.0–46.0)
Hemoglobin: 8.7 g/dL — ABNORMAL LOW (ref 12.0–15.0)
MCH: 31 pg (ref 26.0–34.0)
MCHC: 30 g/dL (ref 30.0–36.0)
MCV: 103.2 fL — ABNORMAL HIGH (ref 80.0–100.0)
Platelets: 260 10*3/uL (ref 150–400)
RBC: 2.81 MIL/uL — ABNORMAL LOW (ref 3.87–5.11)
RDW: 23.5 % — ABNORMAL HIGH (ref 11.5–15.5)
WBC: 18.2 10*3/uL — ABNORMAL HIGH (ref 4.0–10.5)
nRBC: 0.3 % — ABNORMAL HIGH (ref 0.0–0.2)

## 2019-06-20 LAB — HEPARIN ANTI-XA: Heparin LMW: 0.48 IU/mL

## 2019-06-20 MED ORDER — SODIUM CHLORIDE 0.9 % IV BOLUS
250.0000 mL | Freq: Once | INTRAVENOUS | Status: AC
  Administered 2019-06-20: 14:00:00 250 mL via INTRAVENOUS

## 2019-06-20 MED ORDER — ENOXAPARIN SODIUM 60 MG/0.6ML ~~LOC~~ SOLN
50.0000 mg | Freq: Two times a day (BID) | SUBCUTANEOUS | Status: DC
  Administered 2019-06-20 – 2019-07-01 (×24): 50 mg via SUBCUTANEOUS
  Filled 2019-06-20 (×24): qty 0.6

## 2019-06-20 MED ORDER — KCL IN DEXTROSE-NACL 20-5-0.9 MEQ/L-%-% IV SOLN
INTRAVENOUS | Status: DC
  Filled 2019-06-20 (×3): qty 1000

## 2019-06-20 MED ORDER — LEVOTHYROXINE SODIUM 100 MCG/5ML IV SOLN
100.0000 ug | Freq: Every day | INTRAVENOUS | Status: DC
  Administered 2019-06-20 – 2019-06-26 (×7): 100 ug via INTRAVENOUS
  Filled 2019-06-20 (×8): qty 5

## 2019-06-20 NOTE — Progress Notes (Signed)
Subjective: Similar to yesterday, slightly easier to understand.   Exam: Vitals:   06/20/19 0630 06/20/19 0753  BP: (!) 130/97   Pulse: (!) 108 (!) 109  Resp: (!) 30 (!) 37  Temp: (!) 97.5 F (36.4 C)   SpO2: 100% 100%   Gen: In bed, NAD Resp: breathing rapidly, but appears stalbe Abd: soft, nt  Neuro: MS: Awake, answering simple questions, but is dysarthric and speaking very softly so it is difficult to understand her.   CN: Pupils equal round and reactive, extraocular movements intact.  Motor: Moves all extremities to command Sensory: Intact light touch  Pertinent Labs: TSH 19 Cr improved.   Impression: 63 year old female with acute encephalopathy in the setting of getting cefepime 2 g every 8 hours with borderline renal function.    I doubt that this represents HSV encephalitis given that HSV-2 usually causes meningitis, also she had already been treated with valacyclovir.  She has no signs of meningismus or headache.  Especially with her demonstrating improvement, I think that bacterial infection is also very unlikely.    With her EEG revealing a characteristic pattern, coupled with her improvement, I suspect that this represents cefepime neurotoxicity.  Though I think seizure is less likely, until her mental status continues to improve and EEG is repeated, I would favor continuing Keppra.  I would favor change her antibiotics to  Recommendations: 1) continue Keppra 500 twice daily for now 2) Repeat EEG today.  3) neurology will continue to follow   Roland Rack, MD [facility] [phone]  If 7pm- 7am, please page neurology on call as listed in [place].

## 2019-06-20 NOTE — Progress Notes (Signed)
PROGRESS NOTE    Kayla Price   IPJ:[id number]  DOB: Feb 09, 1956  DOA: 06/01/2019 PCP: Lauree Chandler, NP   Brief Narrative:  Kayla Price 63 year old female with PMH of metastatic breast cancer with metastasis to brain and right pleura, s/p whole brain radiation, HIV, CKD stage II, recently hospitalized 05/27/2019-05/31/2023 acute kidney injury, hypotension and superficial ulcerations of the groin/perineum who presented the next day of discharge 06/01/2019 due to worsening drainage from her known skin ulcerations along with generalized weakness.  She was admitted for hypotension, acute kidney injury and ulcers of her groin and perineum.  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  Hospital course complicated by community-acquired pneumonia, extensive right lower extremity DVT, acute hypoxic respiratory failure.  She was transferred to progressive care unit on 12/17, PCCM consulted, BiPAP initiated, primary oncologist and PMT consulted, since she was doing so poorly, discussions regarding transitioning to comfort care were ongoing.    Subjective: Remains somewhat confused.   Assessment & Plan:   Principal Problem:  Acute encephalopathy-likely metabolic -We will follow-up on blood work this morning-I will also check a UA as she has had a Foley catheter for a number of days now-repeat chest x-ray does not show progression of infiltrates and oxygen level is in the 90s and thus I feel that her pneumonia is improving. - Addendum- UA does not show many bacteria- -  MRI shows multiple mets and edema which is improving - EEG showing severe diffuse encephalopathy - Neuro feels that she may have Cefepime toxicity- d/c'd Antibiotics that were started for possible meningitis.    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 -This was the reason for her admission -She has completed a course of Valtrex -Continue to  follow    AKI (acute kidney injury)  -CKD 2/3  -Renal ultrasound 12/3 was unrevealing -This has resolved  Elevated TSH - TSH checked yesterday was 19.709 - will recheck today along with Free T4 and Ts  Hypoxic respiratory failure secondary to community-acquired pneumonia -Patient required treatment with BiPAP starting on 12/18 and has been weaned down now to nasal cannula - she appears to be comfortable at rest - Continue to wean oxygen as able - She is being treated with IV antibiotics including Vanc and cefepime   -  MRSA PCR screen was negative and therefore vancomycin was discontinued on 12/23   - Due to suspected Cefepime toxicity, have d/c'd Cefepime as well- she has received 7 days of Vanc/Cefepime - she still has a RLL infiltrate but we have been able to wean down O2  HIV -Relatively well controlled with a CD4 count greater than 400 on 10/26 -She will need to follow-up with ID as outpatient in 4 to 6 weeks - I spoke with Dr Drucilla Schmidt on 12/24 who recommends that I stop Selzentry  Extensive left leg DVT -Has been placed on Lovenox and it is planned for her to continue Lovenox as outpatient  Metastatic cancer-mets to the brain and right pleural space - is status post post whole brain radiation 11/20, chemotherapy and right modified radical mastectomy -There is a plan for repeat PET scan as outpatient-she is followed by Dr. Jana Hakim who has been seeing her in the hospital as well  Time spent in minutes: 35 DVT prophylaxis: Lovenox Code Status: DO NOT RESUSCITATE Family Communication:  Disposition Plan: To be determined-need to determine the cause for her confusion today Consultants:   Oncology  Critical  care  Infectious disease  Palliative care Procedures:   BiPAP  Catheter Antimicrobials:  Anti-infectives (From admission, onward)   Start     Dose/Rate Route Frequency Ordered Stop   06/19/19 1000  acyclovir (ZOVIRAX) 800 mg in dextrose 5 % 150 mL IVPB  Status:   Discontinued     800 mg 166 mL/hr over 60 Minutes Intravenous Every 8 hours 06/19/19 0627 06/19/19 1345   06/19/19 0000  ampicillin (OMNIPEN) 1 g in sodium chloride 0.9 % 100 mL IVPB  Status:  Discontinued     1 g 300 mL/hr over 20 Minutes Intravenous Every 6 hours 06/18/19 2117 06/18/19 2155   06/18/19 2300  ampicillin (OMNIPEN) 2 g in sodium chloride 0.9 % 100 mL IVPB  Status:  Discontinued     2 g 300 mL/hr over 20 Minutes Intravenous Every 4 hours 06/18/19 2155 06/19/19 1345   06/18/19 2230  vancomycin (VANCOCIN) IVPB 1000 mg/200 mL premix  Status:  Discontinued     1,000 mg 200 mL/hr over 60 Minutes Intravenous Every 12 hours 06/18/19 2147 06/19/19 1345   06/18/19 2230  cefTRIAXone (ROCEPHIN) 2 g in sodium chloride 0.9 % 100 mL IVPB  Status:  Discontinued     2 g 200 mL/hr over 30 Minutes Intravenous Every 12 hours 06/18/19 2147 06/19/19 1351   06/18/19 2200  acyclovir (ZOVIRAX) 800 mg in dextrose 5 % 150 mL IVPB  Status:  Discontinued     800 mg 166 mL/hr over 60 Minutes Intravenous Every 8 hours 06/18/19 2119 06/19/19 0627   06/18/19 2200  cefTRIAXone (ROCEPHIN) 1 g in sodium chloride 0.9 % 100 mL IVPB  Status:  Discontinued     1 g 200 mL/hr over 30 Minutes Intravenous Every 24 hours 06/18/19 2125 06/18/19 2147   06/18/19 2130  cefTRIAXone (ROCEPHIN) 1 g in sodium chloride 0.9 % 100 mL IVPB  Status:  Discontinued     1 g 200 mL/hr over 30 Minutes Intravenous Every 24 hours 06/18/19 2117 06/18/19 2125   06/15/19 1000  vancomycin (VANCOREADY) IVPB 1500 mg/300 mL  Status:  Discontinued     1,500 mg 150 mL/hr over 120 Minutes Intravenous Every 24 hours 06/14/19 1017 06/17/19 1139   06/14/19 1800  vancomycin (VANCOREADY) IVPB 1500 mg/300 mL  Status:  Discontinued     1,500 mg 150 mL/hr over 120 Minutes Intravenous Every 24 hours 06/14/19 0924 06/14/19 1017   06/14/19 1400  ceFEPIme (MAXIPIME) 2 g in sodium chloride 0.9 % 100 mL IVPB  Status:  Discontinued     2 g 200 mL/hr over 30  Minutes Intravenous Every 8 hours 06/14/19 0924 06/18/19 2117   06/13/19 1800  vancomycin (VANCOREADY) IVPB 1250 mg/250 mL  Status:  Discontinued     1,250 mg 166.7 mL/hr over 90 Minutes Intravenous Every 24 hours 06/12/19 1506 06/14/19 0924   06/13/19 1130  ceFEPIme (MAXIPIME) 2 g in sodium chloride 0.9 % 100 mL IVPB  Status:  Discontinued     2 g 200 mL/hr over 30 Minutes Intravenous Every 12 hours 06/13/19 1159 06/14/19 0924   06/12/19 1800  vancomycin (VANCOREADY) IVPB 1750 mg/350 mL     1,750 mg 175 mL/hr over 120 Minutes Intravenous  Once 06/12/19 1506 06/12/19 1940   06/12/19 1600  ceFEPIme (MAXIPIME) 2 g in sodium chloride 0.9 % 100 mL IVPB  Status:  Discontinued     2 g 200 mL/hr over 30 Minutes Intravenous Every 12 hours 06/12/19 1506 06/13/19 1159   06/10/19  0600  cefTRIAXone (ROCEPHIN) 1 g in sodium chloride 0.9 % 100 mL IVPB  Status:  Discontinued     1 g 200 mL/hr over 30 Minutes Intravenous Every 24 hours 06/09/19 0732 06/12/19 1335   06/09/19 0800  doxycycline (VIBRAMYCIN) 100 mg in sodium chloride 0.9 % 250 mL IVPB  Status:  Discontinued     100 mg 125 mL/hr over 120 Minutes Intravenous Every 12 hours 06/09/19 0735 06/12/19 1335   06/09/19 0745  cefTRIAXone (ROCEPHIN) 1 g in sodium chloride 0.9 % 100 mL IVPB     1 g 200 mL/hr over 30 Minutes Intravenous  Once 06/09/19 0738 06/09/19 0924   06/05/19 2200  valACYclovir (VALTREX) tablet 1,000 mg     1,000 mg Oral 2 times daily 06/05/19 1418 06/12/19 1101   06/05/19 1000  valACYclovir (VALTREX) tablet 1,000 mg  Status:  Discontinued     1,000 mg Oral 3 times daily 06/05/19 0834 06/05/19 1418   06/02/19 2200  darunavir-cobicistat (PREZCOBIX) 800-150 MG per tablet 1 tablet     1 tablet Oral Daily at bedtime 06/02/19 0950     06/02/19 2200  dolutegravir (TIVICAY) tablet 50 mg     50 mg Oral Daily at bedtime 06/02/19 1455     06/02/19 2200  rilpivirine (EDURANT) tablet 25 mg     25 mg Oral Daily at bedtime 06/02/19 1455      06/02/19 1700  dolutegravir (TIVICAY) tablet 50 mg  Status:  Discontinued     50 mg Oral Daily with supper 06/02/19 1453 06/02/19 1456   06/02/19 1700  rilpivirine (EDURANT) tablet 25 mg  Status:  Discontinued     25 mg Oral Daily with supper 06/02/19 1453 06/02/19 1456   06/02/19 1415  fluconazole (DIFLUCAN) IVPB 200 mg  Status:  Discontinued     200 mg 100 mL/hr over 60 Minutes Intravenous Every 24 hours 06/02/19 1402 06/05/19 0834   06/02/19 1100  maraviroc (SELZENTRY) tablet 150 mg  Status:  Discontinued     150 mg Oral 2 times daily 06/02/19 0950 06/18/19 1443   06/02/19 1000  cefdinir (OMNICEF) capsule 300 mg     300 mg Oral 2 times daily 06/02/19 0950 06/04/19 2207   06/02/19 1000  fluconazole (DIFLUCAN) tablet 400 mg  Status:  Discontinued     400 mg Oral Daily 06/02/19 0950 06/02/19 1402       Objective: Vitals:   06/20/19 0630 06/20/19 0753 06/20/19 1240 06/20/19 1256  BP: (!) 130/97     Pulse: (!) 108 (!) 109 (!) 112 (!) 106  Resp: (!) 30 (!) 37 (!) 33 (!) 27  Temp: (!) 97.5 F (36.4 C)     TempSrc: Axillary     SpO2: 100% 100% 91% 97%  Weight: 132.5 kg     Height:        Intake/Output Summary (Last 24 hours) at 06/20/2019 1359 Last data filed at 06/19/2019 1800 Gross per 24 hour  Intake 1300 ml  Output 350 ml  Net 950 ml   Filed Weights   06/16/19 0500 06/19/19 0500 06/20/19 0630  Weight: 133.4 kg 131.5 kg 132.5 kg    Examination:  General exam: Appears comfortable -she is alert however will not answer any questions or follow any commands HEENT: PERRLA, oral mucosa moist, no sclera icterus or thrush Respiratory system: Clear to auscultation. Respiratory effort normal. Cardiovascular system: S1 & S2 heard,  No murmurs  Gastrointestinal system: Abdomen soft, non-tender, nondistended. Normal bowel sounds  Central nervous system: Alert -moves upper extremities Extremities: No cyanosis, clubbing or edema Skin: No rashes or ulcers Psychiatry:    Confused    Data Reviewed: I have personally reviewed following labs and imaging studies  CBC: Recent Labs  Lab 06/17/19 0500 06/17/19 2222 06/18/19 1030 06/19/19 1635 06/20/19 0610  WBC 12.7* 15.1* 14.3* 14.1* 18.2*  HGB 8.7* 8.8* 8.3* 8.0* 8.7*  HCT 27.5* 27.6* 26.8* 25.6* 29.0*  MCV 100.7* 99.3 100.4* 102.8* 103.2*  PLT 252 258 234 230 161   Basic Metabolic Panel: Recent Labs  Lab 06/16/19 0500 06/17/19 2222 06/18/19 1030 06/19/19 0358 06/20/19 0610  NA 140 142 141 142 145  K 4.0 3.5 3.2* 3.4* 3.6  CL 103 105 105 107 112*  CO2 28 27 27 28 26   GLUCOSE 84 100* 91 107* 146*  BUN 17 21 19 19 17   CREATININE 1.12* 1.11* 1.03* 0.95 0.98  CALCIUM 8.8* 9.5 9.0 8.8* 8.6*   GFR: Estimated Creatinine Clearance: 83.5 mL/min (by C-G formula based on SCr of 0.98 mg/dL). Liver Function Tests: Recent Labs  Lab 06/16/19 0500  AST 29  ALT 21  ALKPHOS 53  BILITOT 0.5  PROT 5.3*  ALBUMIN 1.9*   No results for input(s): LIPASE, AMYLASE in the last 168 hours. Recent Labs  Lab 06/18/19 1721  AMMONIA 19   Coagulation Profile: No results for input(s): INR, PROTIME in the last 168 hours. Cardiac Enzymes: No results for input(s): CKTOTAL, CKMB, CKMBINDEX, TROPONINI in the last 168 hours. BNP (last 3 results) No results for input(s): PROBNP in the last 8760 hours. HbA1C: No results for input(s): HGBA1C in the last 72 hours. CBG: Recent Labs  Lab 06/17/19 2208 06/19/19 0739  GLUCAP 99 85   Lipid Profile: No results for input(s): CHOL, HDL, LDLCALC, TRIG, CHOLHDL, LDLDIRECT in the last 72 hours. Thyroid Function Tests: Recent Labs    06/19/19 1634 06/19/19 1635  TSH 23.171*  --   FREET4  --  0.70   Anemia Panel: Recent Labs    06/18/19 1644  VITAMINB12 2,112*   Urine analysis:    Component Value Date/Time   COLORURINE AMBER (A) 06/18/2019 1112   APPEARANCEUR CLOUDY (A) 06/18/2019 1112   LABSPEC 1.020 06/18/2019 1112   PHURINE 5.0 06/18/2019 1112    GLUCOSEU NEGATIVE 06/18/2019 1112   GLUCOSEU NEG mg/dL 07/23/2006 2113   HGBUR MODERATE (A) 06/18/2019 1112   BILIRUBINUR NEGATIVE 06/18/2019 1112   KETONESUR 5 (A) 06/18/2019 1112   PROTEINUR 100 (A) 06/18/2019 1112   UROBILINOGEN 1 07/23/2006 2113   NITRITE NEGATIVE 06/18/2019 1112   LEUKOCYTESUR NEGATIVE 06/18/2019 1112   Sepsis Labs: @LABRCNTIP (procalcitonin:4,lacticidven:4) ) Recent Results (from the past 240 hour(s))  MRSA PCR Screening     Status: None   Collection Time: 06/16/19  5:30 PM   Specimen: Nasal Mucosa; Nasopharyngeal  Result Value Ref Range Status   MRSA by PCR NEGATIVE NEGATIVE Final    Comment:        The GeneXpert MRSA Assay (FDA approved for NASAL specimens only), is one component of a comprehensive MRSA colonization surveillance program. It is not intended to diagnose MRSA infection nor to guide or monitor treatment for MRSA infections. Performed at [facility], [place] [address], [place], [place] [address]   Culture, Urine     Status: None   Collection Time: 06/18/19 11:12 AM   Specimen: Urine, Random  Result Value Ref Range Status   Specimen Description URINE, RANDOM  Final   Special  Requests NONE  Final   Culture   Final    NO GROWTH Performed at [facility], [place] [address], [place], [place] [address]    Report Status 06/19/2019 FINAL  Final         Radiology Studies: MR BRAIN W WO CONTRAST  Result Date: 06/18/2019 CLINICAL DATA:  Encephalopathy. Metastatic breast cancer. Known brain metastases. EXAM: MRI HEAD WITHOUT AND WITH CONTRAST TECHNIQUE: Multiplanar, multiecho pulse sequences of the brain and surrounding structures were obtained without and with intravenous contrast. CONTRAST:  55mL GADAVIST GADOBUTROL 1 MMOL/ML IV SOLN COMPARISON:  MR head without and with contrast 04/28/2019 FINDINGS: Brain: The right occipital lesion has decreased somewhat. There is mostly peripheral enhancement with a nodular component posteriorly.  The lesion now measures 2.4 x 2.6 x 2.1 cm. The lesion previously measured 3.4 x 3.1 x 2.7 cm in the same dimensions. A left occipital lesion previously measured 13 x 11 mm. It now measures 13 x 9 mm. A more lateral lesion in the left occipital lobe has decreased from 9 mm to 6.5 mm. A lesion in the lateral left temporal lobe has decreased from 6.53.5 mm on image 23 of series 10. A medial right temporal lobe lesion on image 25 has decreased from 7.5 mm to 6 mm. A lesion in the left paramedian superior cerebellar vermis now measures 4 mm. Previously measured 7 mm. A lesion in the left paramedian pons previously measured 4.5 mm. It is now punctate in size. A lesion in the anterior left frontal lobe white matter on image 36 as decreased from 7.5 mm to 3.5 mm. A punctate lesion in the left corona radiata previously measured 4 mm. Punctate lesion is again noted in the posterior left frontal lobe on image 45. No new enhancing lesions are present. Vasogenic edema is most prominent about the occipital lobe lesions. This has decreased since the prior exam. No acute infarct or hemorrhage is present. Ventricles are of normal size. No significant extraaxial fluid collection is present. Vascular: Flow is present in the major intracranial arteries. Skull and upper cervical spine: Extensive marrow signal changes in the calvarium ir consistent with osseous metastases. Heterogeneous marrow signal is present in the upper cervical spine. Sinuses/Orbits: Minimal fluid is present in the left maxillary sinus. A fluid level is present in the left sphenoid sinus. Left ethmoid air cells are partially opacified. The paranasal sinuses and mastoid air cells are otherwise clear. IMPRESSION: 1. Decrease in size of multiple brain metastases identified on the prior exam. No new lesions are present. 2. Decreased vasogenic edema, still predominantly occipital lobes. 3. Diffuse marrow signal changes in the skull concerning for metastatic disease. 4.  Left maxillary and sphenoid sinus disease. Electronically Signed   By: San Morelle M.D.   On: 06/18/2019 18:44   DG CHEST PORT 1 VIEW  Result Date: 06/19/2019 CLINICAL DATA:  Unresponsive and agitated. History metastatic breast carcinoma with known extensive right-sided pleural and parenchymal metastatic disease. EXAM: PORTABLE CHEST 1 VIEW COMPARISON:  06/18/2019 FINDINGS: Stable appearance of Port-A-Cath. Stable volume loss and poor aeration of the right lung secondary to extensive pleural tumor and associated loculated right pleural fluid. Lower volume of the left lung with persistent dense infiltrate in the left mid lung and probable component of infiltrate at the left lung base. No pneumothorax. No significant left-sided pleural fluid identified. IMPRESSION: Stable extensive pleural and parenchymal disease in the right chest with associated loculated right pleural fluid. Stable dense infiltrate in the left  mid lung and probable component of infiltrate at the left lung base. Electronically Signed   By: Aletta Edouard M.D.   On: 06/19/2019 14:37   EEG adult  Result Date: 06/18/2019 Lora Havens, MD     06/18/2019  8:55 PM Patient Name: ZAARA SPROWL MRN: [id number] Epilepsy Attending: Lora Havens Referring Physician/Provider: Dr Debbe Odea Date: 06/18/2019 Duration: 24.56 mins Patient history: 63yo F with HIV, brain mets and now with ams. EEG to evaluate for seizure Level of alertness: awake AEDs during EEG study: None Technical aspects: This EEG study was done with scalp electrodes positioned according to the 10-20 International system of electrode placement. Electrical activity was acquired at a sampling rate of 500Hz  and reviewed with a high frequency filter of 70Hz  and a low frequency filter of 1Hz . EEG data were recorded continuously and digitally stored. DESCRIPTION: During awake state, no clear posterior dominant rhythm. EEG also showed continuous generalized  3-5hz   theta-delta slowing. Triphasic waves, generalized, maximal bifrontal, at 2Hz  were also noted. Hyperventilation and photic stimulation were not performed. ABNORMALITY - Continuous slow, generalized - Triphasic waves, generalized IMPRESSION: This study is  Suggestive of severe diffuse encephalopathy, non specific to etiology but could be secondary to toxic-metabolic causes, cepefime toxicity. No seizures or definite epileptiform discharges were seen throughout the recording. Priyanka Barbra Sarks      Scheduled Meds: . Chlorhexidine Gluconate Cloth  6 each Topical Daily  . darunavir-cobicistat  1 tablet Oral QHS  . dolutegravir  50 mg Oral QHS   And  . rilpivirine  25 mg Oral QHS  . enoxaparin (LOVENOX) injection  50 mg Subcutaneous Q12H  . feeding supplement (ENSURE ENLIVE)  237 mL Oral TID BM  . levothyroxine  100 mcg Intravenous Daily  . multivitamin with minerals  1 tablet Oral Daily   Continuous Infusions: . sodium chloride Stopped (06/19/19 0514)  . dextrose 5 % and 0.9 % NaCl with KCl 40 mEq/L 75 mL/hr at 06/20/19 0210  . levETIRAcetam 500 mg (06/20/19 1044)  . thiamine injection 250 mg (06/20/19 1300)     LOS: 17 days      Debbe Odea, MD [facility] Pager: [email or web address] Password [facility] 06/20/2019, 1:59 PM

## 2019-06-20 NOTE — Progress Notes (Signed)
Pt order for BIPAP is PRN at this time. Pt is very agitated and would not tolerate if she required placement. Pt respiratory status is stable at this time on HFNC [place]ter 8 Lpm w/sats of 97%. RT will continue to monitor.

## 2019-06-20 NOTE — Progress Notes (Signed)
ANTICOAGULATION CONSULT NOTE  Pharmacy Consult for Lovenox Indication: DVT  [name] Pharmacy consulted for Lovenox therapy.  -low molecular weight heparin level= 0.48   Goal of Therapy:  Anti-Xa lovenox level 0.6-1.2 drawn 4 hrs after dose. Monitor platelets by anticoagulation protocol: Yes   Plan:  -Resume lovenox at 50 mg sq q12 hours  Thanks for allowing pharmacy to be a part of this patient's care.  Excell Seltzer, PharmD Clinical Pharmacist

## 2019-06-21 ENCOUNTER — Inpatient Hospital Stay (HOSPITAL_COMMUNITY): Payer: Medicare HMO

## 2019-06-21 LAB — CBC
HCT: 28.1 % — ABNORMAL LOW (ref 36.0–46.0)
Hemoglobin: 8.2 g/dL — ABNORMAL LOW (ref 12.0–15.0)
MCH: 31.7 pg (ref 26.0–34.0)
MCHC: 29.2 g/dL — ABNORMAL LOW (ref 30.0–36.0)
MCV: 108.5 fL — ABNORMAL HIGH (ref 80.0–100.0)
Platelets: 254 10*3/uL (ref 150–400)
RBC: 2.59 MIL/uL — ABNORMAL LOW (ref 3.87–5.11)
RDW: 24.3 % — ABNORMAL HIGH (ref 11.5–15.5)
WBC: 18.5 10*3/uL — ABNORMAL HIGH (ref 4.0–10.5)
nRBC: 1 % — ABNORMAL HIGH (ref 0.0–0.2)

## 2019-06-21 LAB — BLOOD GAS, ARTERIAL
Acid-base deficit: 0.6 mmol/L (ref 0.0–2.0)
Bicarbonate: 26.2 mmol/L (ref 20.0–28.0)
FIO2: 36
O2 Saturation: 96.6 %
Patient temperature: 36.4
pCO2 arterial: 64.7 mmHg — ABNORMAL HIGH (ref 32.0–48.0)
pH, Arterial: 7.227 — ABNORMAL LOW (ref 7.350–7.450)
pO2, Arterial: 77.7 mmHg — ABNORMAL LOW (ref 83.0–108.0)

## 2019-06-21 LAB — BASIC METABOLIC PANEL
Anion gap: 4 — ABNORMAL LOW (ref 5–15)
BUN: 18 mg/dL (ref 8–23)
CO2: 27 mmol/L (ref 22–32)
Calcium: 8.3 mg/dL — ABNORMAL LOW (ref 8.9–10.3)
Chloride: 117 mmol/L — ABNORMAL HIGH (ref 98–111)
Creatinine, Ser: 1 mg/dL (ref 0.44–1.00)
GFR calc Af Amer: >60 mL/min (ref >60)
GFR calc non Af Amer: 60 mL/min — ABNORMAL LOW (ref >60)
Glucose, Bld: 132 mg/dL — ABNORMAL HIGH (ref 70–99)
Potassium: 4.3 mmol/L (ref 3.5–5.1)
Sodium: 148 mmol/L — ABNORMAL HIGH (ref 135–145)

## 2019-06-21 MED ORDER — DEXTROSE 5 % IV SOLN: INTRAVENOUS | Status: DC

## 2019-06-21 MED ORDER — SODIUM CHLORIDE 0.9 % IV SOLN
250.0000 mg | Freq: Two times a day (BID) | INTRAVENOUS | Status: DC
  Administered 2019-06-21 – 2019-06-26 (×11): 250 mg via INTRAVENOUS
  Filled 2019-06-21 (×13): qty 2.5

## 2019-06-21 NOTE — Procedures (Signed)
Patient Name: Kayla Price  MRN: [id number]  EEG Attending: Roland Rack  Referring Physician/Provider: Roland Rack Date: 06/21/2019 Duration: 24 minutes  Patient history: 63 yo F with multiple intracranial metastasis with encephalopathy. Recent EEG with triphasic waves.   Level of alertness: Encephalopathic  AEDs during EEG study: Keppra 500mg  BID  Technical aspects: This EEG study was done with scalp electrodes positioned according to the 10-20 International system of electrode placement. Electrical activity was acquired at a sampling rate of 500Hz  and reviewed with a high frequency filter of 70Hz  and a low frequency filter of 1Hz . EEG data were recorded continuously and digitally stored.   BACKGROUND ACTIVITY:  The background consist predominately of generalized irregular slow activity with some frontocentral predominant beta range activity.  There are poorly formed sleep structures at times.  No definite posterior dominant rhythm was seen, however following stimulation there was briefly a posteriorly predominant rhythm of 8 Hz but this was very briefly sustained.   EPILEPTIFORM ACTIVITY: Interictal epileptiform activity: None  Ictal Activity: None  OTHER EVENTS: None  SLEEP RECORDINGS:  Only awake and drowsy states were recorded. Drowsiness was characterized by attenuation of the posterior background rhythm.  ACTIVATION PROCEDURES:  Hyperventilation and photic stimulation were not performed.  IMPRESSION: This EEG is consistent with a generalized nonspecific cerebral dysfunction.  There has been significant improvement since the previous recording and that no triphasic waves are seen in the study.   There was no seizure or evidence of seizure predisposition recorded on this study. Please note that lack of epileptiform activity on EEG does not preclude the possibility of epilepsy.    Roland Rack, MD [facility] [phone]  If 7pm-  7am, please page neurology on call as listed in [place].

## 2019-06-21 NOTE — Progress Notes (Signed)
ABG results called to Greta Doom, MD. New order for BIPAP.

## 2019-06-21 NOTE — Progress Notes (Signed)
Pt has been on BIPAP since 1524  Today. RT found pt with mitts on which is contraindicated for pts on BIPAP. RT removed mitts and notified the RN. RT later received a call from the RN that pt was pulling mask off and agitated. Pt was then place on HFNC Salter on 9 Lpm. Pt sats are 100% at this time. RT will continue to monitor.

## 2019-06-21 NOTE — Progress Notes (Signed)
EEG complete - results pending 

## 2019-06-21 NOTE — Progress Notes (Addendum)
PROGRESS NOTE    Kayla Price   WVP:[id number]  DOB: Feb 28, 1956  DOA: 06/01/2019 PCP: Lauree Chandler, NP   Brief Narrative:  Kayla Price 63 year old female with PMH of metastatic breast cancer with metastasis to brain and right pleura, s/p whole brain radiation, HIV, CKD stage II, recently hospitalized 05/27/2019-05/31/2023 acute kidney injury, hypotension and superficial ulcerations of the groin/perineum who presented the next day of discharge 06/01/2019 due to worsening drainage from her known skin ulcerations along with generalized weakness.  She was admitted for hypotension, acute kidney injury and ulcers of her groin and perineum.  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  Hospital course complicated by community-acquired pneumonia, extensive right lower extremity DVT, acute hypoxic respiratory failure.  She was transferred to progressive care unit on 12/17, PCCM consulted, BiPAP initiated, primary oncologist and PMT consulted, since she was doing so poorly, discussions regarding transitioning to comfort care were ongoing.    Subjective: Somnolent today. She received Ativan and Morphine overnight.   Assessment & Plan:   Principal Problem:  Acute Toxic encephalopathy suspected to be Cefepime toxicity -We will follow-up on blood work this morning-I will also check a UA as she has had a Foley catheter for a number of days now-repeat chest x-ray does not show progression of infiltrates and oxygen level is in the 90s and thus I feel that her pneumonia is improving. - Addendum- UA does not show many bacteria- -  MRI shows multiple mets and edema which is improving - EEG showing severe diffuse encephalopathy - Neuro feels that she may have Cefepime toxicity- d/c'd Antibiotics that were started for possible meningitis.    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 -This was the reason for her admission -She  has completed a course of Valtrex -Continue to follow    AKI (acute kidney injury)  -CKD 2/3  -Renal ultrasound 12/3 was unrevealing -This has resolved  Elevated TSH - TSH was 19.709 and when rechecked was 23.171 - Free T 4 waas 0.70- cont Synthroid IV  Hypoxic respiratory failure secondary to community-acquired pneumonia -Patient required treatment with BiPAP starting on 12/18 and has been weaned down now to nasal cannula - she appears to be comfortable at rest - Continue to wean oxygen as able - She is being treated with IV antibiotics including Vanc and cefepime   -  MRSA PCR screen was negative and therefore vancomycin was discontinued on 12/23   - Due to suspected Cefepime toxicity, have d/c'd Cefepime as well- she has received 7 days of Vanc/Cefepime - she still has a RLL infiltrate but we have been able to wean down O2  HIV -Relatively well controlled with a CD4 count greater than 400 on 10/26 -She will need to follow-up with ID as outpatient in 4 to 6 weeks - I spoke with Dr Drucilla Schmidt on 12/24 who recommended that I stop Selzentry  Extensive left leg DVT -Has been placed on Lovenox - due to interactions with HIV meds, she will need to be transitioned to a NOAC  Metastatic Breast cancer-mets to the brain and right pleural space - is status post post whole brain radiation 11/20, chemotherapy and right modified radical mastectomy -There is a plan for repeat PET scan as outpatient-she is followed by Dr. Jana Hakim who has been seeing her in the hospital as well  Time spent in minutes: 35 DVT prophylaxis: Lovenox Code Status: DO NOT RESUSCITATE Family Communication: daughter [name]  Baker Disposition Plan: To be determined- awaiting confusion to resolve Consultants:   Oncology  Critical care  Infectious disease  Palliative care Procedures:   BiPAP  Catheter Antimicrobials:  Anti-infectives (From admission, onward)   Start     Dose/Rate Route Frequency Ordered Stop    06/19/19 1000  acyclovir (ZOVIRAX) 800 mg in dextrose 5 % 150 mL IVPB  Status:  Discontinued     800 mg 166 mL/hr over 60 Minutes Intravenous Every 8 hours 06/19/19 0627 06/19/19 1345   06/19/19 0000  ampicillin (OMNIPEN) 1 g in sodium chloride 0.9 % 100 mL IVPB  Status:  Discontinued     1 g 300 mL/hr over 20 Minutes Intravenous Every 6 hours 06/18/19 2117 06/18/19 2155   06/18/19 2300  ampicillin (OMNIPEN) 2 g in sodium chloride 0.9 % 100 mL IVPB  Status:  Discontinued     2 g 300 mL/hr over 20 Minutes Intravenous Every 4 hours 06/18/19 2155 06/19/19 1345   06/18/19 2230  vancomycin (VANCOCIN) IVPB 1000 mg/200 mL premix  Status:  Discontinued     1,000 mg 200 mL/hr over 60 Minutes Intravenous Every 12 hours 06/18/19 2147 06/19/19 1345   06/18/19 2230  cefTRIAXone (ROCEPHIN) 2 g in sodium chloride 0.9 % 100 mL IVPB  Status:  Discontinued     2 g 200 mL/hr over 30 Minutes Intravenous Every 12 hours 06/18/19 2147 06/19/19 1351   06/18/19 2200  acyclovir (ZOVIRAX) 800 mg in dextrose 5 % 150 mL IVPB  Status:  Discontinued     800 mg 166 mL/hr over 60 Minutes Intravenous Every 8 hours 06/18/19 2119 06/19/19 0627   06/18/19 2200  cefTRIAXone (ROCEPHIN) 1 g in sodium chloride 0.9 % 100 mL IVPB  Status:  Discontinued     1 g 200 mL/hr over 30 Minutes Intravenous Every 24 hours 06/18/19 2125 06/18/19 2147   06/18/19 2130  cefTRIAXone (ROCEPHIN) 1 g in sodium chloride 0.9 % 100 mL IVPB  Status:  Discontinued     1 g 200 mL/hr over 30 Minutes Intravenous Every 24 hours 06/18/19 2117 06/18/19 2125   06/15/19 1000  vancomycin (VANCOREADY) IVPB 1500 mg/300 mL  Status:  Discontinued     1,500 mg 150 mL/hr over 120 Minutes Intravenous Every 24 hours 06/14/19 1017 06/17/19 1139   06/14/19 1800  vancomycin (VANCOREADY) IVPB 1500 mg/300 mL  Status:  Discontinued     1,500 mg 150 mL/hr over 120 Minutes Intravenous Every 24 hours 06/14/19 0924 06/14/19 1017   06/14/19 1400  ceFEPIme (MAXIPIME) 2 g in  sodium chloride 0.9 % 100 mL IVPB  Status:  Discontinued     2 g 200 mL/hr over 30 Minutes Intravenous Every 8 hours 06/14/19 0924 06/18/19 2117   06/13/19 1800  vancomycin (VANCOREADY) IVPB 1250 mg/250 mL  Status:  Discontinued     1,250 mg 166.7 mL/hr over 90 Minutes Intravenous Every 24 hours 06/12/19 1506 06/14/19 0924   06/13/19 1130  ceFEPIme (MAXIPIME) 2 g in sodium chloride 0.9 % 100 mL IVPB  Status:  Discontinued     2 g 200 mL/hr over 30 Minutes Intravenous Every 12 hours 06/13/19 1159 06/14/19 0924   06/12/19 1800  vancomycin (VANCOREADY) IVPB 1750 mg/350 mL     1,750 mg 175 mL/hr over 120 Minutes Intravenous  Once 06/12/19 1506 06/12/19 1940   06/12/19 1600  ceFEPIme (MAXIPIME) 2 g in sodium chloride 0.9 % 100 mL IVPB  Status:  Discontinued     2 g  200 mL/hr over 30 Minutes Intravenous Every 12 hours 06/12/19 1506 06/13/19 1159   06/10/19 0600  cefTRIAXone (ROCEPHIN) 1 g in sodium chloride 0.9 % 100 mL IVPB  Status:  Discontinued     1 g 200 mL/hr over 30 Minutes Intravenous Every 24 hours 06/09/19 0732 06/12/19 1335   06/09/19 0800  doxycycline (VIBRAMYCIN) 100 mg in sodium chloride 0.9 % 250 mL IVPB  Status:  Discontinued     100 mg 125 mL/hr over 120 Minutes Intravenous Every 12 hours 06/09/19 0735 06/12/19 1335   06/09/19 0745  cefTRIAXone (ROCEPHIN) 1 g in sodium chloride 0.9 % 100 mL IVPB     1 g 200 mL/hr over 30 Minutes Intravenous  Once 06/09/19 0738 06/09/19 0924   06/05/19 2200  valACYclovir (VALTREX) tablet 1,000 mg     1,000 mg Oral 2 times daily 06/05/19 1418 06/12/19 1101   06/05/19 1000  valACYclovir (VALTREX) tablet 1,000 mg  Status:  Discontinued     1,000 mg Oral 3 times daily 06/05/19 0834 06/05/19 1418   06/02/19 2200  darunavir-cobicistat (PREZCOBIX) 800-150 MG per tablet 1 tablet     1 tablet Oral Daily at bedtime 06/02/19 0950     06/02/19 2200  dolutegravir (TIVICAY) tablet 50 mg     50 mg Oral Daily at bedtime 06/02/19 1455     06/02/19 2200   rilpivirine (EDURANT) tablet 25 mg     25 mg Oral Daily at bedtime 06/02/19 1455     06/02/19 1700  dolutegravir (TIVICAY) tablet 50 mg  Status:  Discontinued     50 mg Oral Daily with supper 06/02/19 1453 06/02/19 1456   06/02/19 1700  rilpivirine (EDURANT) tablet 25 mg  Status:  Discontinued     25 mg Oral Daily with supper 06/02/19 1453 06/02/19 1456   06/02/19 1415  fluconazole (DIFLUCAN) IVPB 200 mg  Status:  Discontinued     200 mg 100 mL/hr over 60 Minutes Intravenous Every 24 hours 06/02/19 1402 06/05/19 0834   06/02/19 1100  maraviroc (SELZENTRY) tablet 150 mg  Status:  Discontinued     150 mg Oral 2 times daily 06/02/19 0950 06/18/19 1443   06/02/19 1000  cefdinir (OMNICEF) capsule 300 mg     300 mg Oral 2 times daily 06/02/19 0950 06/04/19 2207   06/02/19 1000  fluconazole (DIFLUCAN) tablet 400 mg  Status:  Discontinued     400 mg Oral Daily 06/02/19 0950 06/02/19 1402       Objective: Vitals:   06/20/19 1240 06/20/19 1256 06/20/19 1428 06/20/19 2212  BP:   (!) 134/102   Pulse: (!) 112 (!) 106 (!) 108   Resp: (!) 33 (!) 27  16  Temp:   (!) 97.5 F (36.4 C)   TempSrc:   Oral   SpO2: 91% 97%    Weight:      Height:        Intake/Output Summary (Last 24 hours) at 06/21/2019 1031 Last data filed at 06/21/2019 0656 Gross per 24 hour  Intake 1941.95 ml  Output 700 ml  Net 1241.95 ml   Filed Weights   06/16/19 0500 06/19/19 0500 06/20/19 0630  Weight: 133.4 kg 131.5 kg 132.5 kg    Examination: General exam: Appears comfortable  HEENT: PERRLA, oral mucosa moist, no sclera icterus or thrush Respiratory system: Clear to auscultation. Respiratory effort normal. Cardiovascular system: S1 & S2 heard,  No murmurs  Gastrointestinal system: Abdomen soft, non-tender, nondistended. Normal bowel sounds  Central nervous system: sleepy this AM Extremities: No cyanosis, clubbing or edema Skin: No rashes or ulcers  Data Reviewed: I have personally reviewed following labs  and imaging studies  CBC: Recent Labs  Lab 06/17/19 2222 06/18/19 1030 06/19/19 1635 06/20/19 0610 06/21/19 0645  WBC 15.1* 14.3* 14.1* 18.2* 18.5*  HGB 8.8* 8.3* 8.0* 8.7* 8.2*  HCT 27.6* 26.8* 25.6* 29.0* 28.1*  MCV 99.3 100.4* 102.8* 103.2* 108.5*  PLT 258 234 230 260 270   Basic Metabolic Panel: Recent Labs  Lab 06/17/19 2222 06/18/19 1030 06/19/19 0358 06/20/19 0610 06/21/19 0645  NA 142 141 142 145 148*  K 3.5 3.2* 3.4* 3.6 4.3  CL 105 105 107 112* 117*  CO2 27 27 28 26 27   GLUCOSE 100* 91 107* 146* 132*  BUN 21 19 19 17 18   CREATININE 1.11* 1.03* 0.95 0.98 1.00  CALCIUM 9.5 9.0 8.8* 8.6* 8.3*   GFR: Estimated Creatinine Clearance: 81.8 mL/min (by C-G formula based on SCr of 1 mg/dL). Liver Function Tests: Recent Labs  Lab 06/16/19 0500  AST 29  ALT 21  ALKPHOS 53  BILITOT 0.5  PROT 5.3*  ALBUMIN 1.9*   No results for input(s): LIPASE, AMYLASE in the last 168 hours. Recent Labs  Lab 06/18/19 1721  AMMONIA 19   Coagulation Profile: No results for input(s): INR, PROTIME in the last 168 hours. Cardiac Enzymes: No results for input(s): CKTOTAL, CKMB, CKMBINDEX, TROPONINI in the last 168 hours. BNP (last 3 results) No results for input(s): PROBNP in the last 8760 hours. HbA1C: No results for input(s): HGBA1C in the last 72 hours. CBG: Recent Labs  Lab 06/17/19 2208 06/19/19 0739  GLUCAP 99 85   Lipid Profile: No results for input(s): CHOL, HDL, LDLCALC, TRIG, CHOLHDL, LDLDIRECT in the last 72 hours. Thyroid Function Tests: Recent Labs    06/19/19 1634 06/19/19 1635  TSH 23.171*  --   FREET4  --  0.70   Anemia Panel: Recent Labs    06/18/19 1644  VITAMINB12 2,112*   Urine analysis:    Component Value Date/Time   COLORURINE AMBER (A) 06/18/2019 1112   APPEARANCEUR CLOUDY (A) 06/18/2019 1112   LABSPEC 1.020 06/18/2019 1112   PHURINE 5.0 06/18/2019 1112   GLUCOSEU NEGATIVE 06/18/2019 1112   GLUCOSEU NEG mg/dL 07/23/2006 2113    HGBUR MODERATE (A) 06/18/2019 1112   BILIRUBINUR NEGATIVE 06/18/2019 1112   KETONESUR 5 (A) 06/18/2019 1112   PROTEINUR 100 (A) 06/18/2019 1112   UROBILINOGEN 1 07/23/2006 2113   NITRITE NEGATIVE 06/18/2019 1112   LEUKOCYTESUR NEGATIVE 06/18/2019 1112   Sepsis Labs: @LABRCNTIP (procalcitonin:4,lacticidven:4) ) Recent Results (from the past 240 hour(s))  MRSA PCR Screening     Status: None   Collection Time: 06/16/19  5:30 PM   Specimen: Nasal Mucosa; Nasopharyngeal  Result Value Ref Range Status   MRSA by PCR NEGATIVE NEGATIVE Final    Comment:        The GeneXpert MRSA Assay (FDA approved for NASAL specimens only), is one component of a comprehensive MRSA colonization surveillance program. It is not intended to diagnose MRSA infection nor to guide or monitor treatment for MRSA infections. Performed at [facility], [place] [address] [place], [place] [address]   Culture, Urine     Status: None   Collection Time: 06/18/19 11:12 AM   Specimen: Urine, Random  Result Value Ref Range Status   Specimen Description URINE, RANDOM  Final   Special Requests NONE  Final   Culture  Final    NO GROWTH Performed at [facility], [place] [address] [place], [place] [address]    Report Status 06/19/2019 FINAL  Final         Radiology Studies: DG CHEST PORT 1 VIEW  Result Date: 06/19/2019 CLINICAL DATA:  Unresponsive and agitated. History metastatic breast carcinoma with known extensive right-sided pleural and parenchymal metastatic disease. EXAM: PORTABLE CHEST 1 VIEW COMPARISON:  06/18/2019 FINDINGS: Stable appearance of Port-A-Cath. Stable volume loss and poor aeration of the right lung secondary to extensive pleural tumor and associated loculated right pleural fluid. Lower volume of the left lung with persistent dense infiltrate in the left mid lung and probable component of infiltrate at the left lung base. No pneumothorax. No significant left-sided pleural fluid  identified. IMPRESSION: Stable extensive pleural and parenchymal disease in the right chest with associated loculated right pleural fluid. Stable dense infiltrate in the left mid lung and probable component of infiltrate at the left lung base. Electronically Signed   By: Aletta Edouard M.D.   On: 06/19/2019 14:37      Scheduled Meds: . Chlorhexidine Gluconate Cloth  6 each Topical Daily  . darunavir-cobicistat  1 tablet Oral QHS  . dolutegravir  50 mg Oral QHS   And  . rilpivirine  25 mg Oral QHS  . enoxaparin (LOVENOX) injection  50 mg Subcutaneous Q12H  . feeding supplement (ENSURE ENLIVE)  237 mL Oral TID BM  . levothyroxine  100 mcg Intravenous Daily  . multivitamin with minerals  1 tablet Oral Daily   Continuous Infusions: . sodium chloride 10 mL/hr at 06/21/19 0152  . dextrose 100 mL/hr at 06/21/19 0911  . levETIRAcetam 500 mg (06/21/19 1021)  . thiamine injection 250 mg (06/21/19 0907)     LOS: 18 days      Debbe Odea, MD [facility] Pager: [email or web address] Password [facility] 06/21/2019, 10:31 AM

## 2019-06-22 DIAGNOSIS — Z515 Encounter for palliative care: Secondary | ICD-10-CM

## 2019-06-22 DIAGNOSIS — G92 Toxic encephalopathy: Secondary | ICD-10-CM

## 2019-06-22 LAB — BLOOD GAS, ARTERIAL
Acid-Base Excess: 1.3 mmol/L (ref 0.0–2.0)
Acid-base deficit: 0.4 mmol/L (ref 0.0–2.0)
Bicarbonate: 27.6 mmol/L (ref 20.0–28.0)
Bicarbonate: 26.6 mmol/L (ref 20.0–28.0)
FIO2: 56
FIO2: 30
O2 Saturation: 98.1 %
O2 Saturation: 95.8 %
Patient temperature: 36.6
Patient temperature: 36.6
pCO2 arterial: 80.7 mmHg (ref 32.0–48.0)
pCO2 arterial: 51.2 mmHg — ABNORMAL HIGH (ref 32.0–48.0)
pH, Arterial: 7.157 — CL (ref 7.350–7.450)
pH, Arterial: 7.333 — ABNORMAL LOW (ref 7.350–7.450)
pO2, Arterial: 120 mmHg — ABNORMAL HIGH (ref 83.0–108.0)
pO2, Arterial: 72.6 mmHg — ABNORMAL LOW (ref 83.0–108.0)

## 2019-06-22 LAB — BASIC METABOLIC PANEL
Anion gap: 6 (ref 5–15)
BUN: 16 mg/dL (ref 8–23)
CO2: 26 mmol/L (ref 22–32)
Calcium: 8.2 mg/dL — ABNORMAL LOW (ref 8.9–10.3)
Chloride: 114 mmol/L — ABNORMAL HIGH (ref 98–111)
Creatinine, Ser: 1.03 mg/dL — ABNORMAL HIGH (ref 0.44–1.00)
GFR calc Af Amer: >60 mL/min (ref >60)
GFR calc non Af Amer: 58 mL/min — ABNORMAL LOW (ref >60)
Glucose, Bld: 121 mg/dL — ABNORMAL HIGH (ref 70–99)
Potassium: 3.7 mmol/L (ref 3.5–5.1)
Sodium: 146 mmol/L — ABNORMAL HIGH (ref 135–145)

## 2019-06-22 LAB — CBC
HCT: 28.5 % — ABNORMAL LOW (ref 36.0–46.0)
Hemoglobin: 8.5 g/dL — ABNORMAL LOW (ref 12.0–15.0)
MCH: 32 pg (ref 26.0–34.0)
MCHC: 29.8 g/dL — ABNORMAL LOW (ref 30.0–36.0)
MCV: 107.1 fL — ABNORMAL HIGH (ref 80.0–100.0)
Platelets: 247 10*3/uL (ref 150–400)
RBC: 2.66 MIL/uL — ABNORMAL LOW (ref 3.87–5.11)
RDW: 24.4 % — ABNORMAL HIGH (ref 11.5–15.5)
WBC: 16.2 10*3/uL — ABNORMAL HIGH (ref 4.0–10.5)
nRBC: 1.2 % — ABNORMAL HIGH (ref 0.0–0.2)

## 2019-06-22 MED ORDER — MORPHINE SULFATE (PF) 2 MG/ML IV SOLN
2.0000 mg | INTRAVENOUS | Status: DC | PRN
  Administered 2019-06-22 – 2019-06-28 (×13): 2 mg via INTRAVENOUS
  Filled 2019-06-22 (×14): qty 1

## 2019-06-22 NOTE — Progress Notes (Signed)
Pt continues to pull off BIPAP. Per RT placed on HFNC w/ 8-10 L. Pt has been restless and moaning. Administered 1 mg of Ativan twice overnight. Seemed to offer no relief. Per family pain meds d/c 06/21/19.

## 2019-06-22 NOTE — Progress Notes (Signed)
PT Cancellation Note  Patient Details Name: Kayla Price MRN: [id number] DOB: 10/21/1955   Cancelled Treatment:    Reason Eval/Treat Not Completed: Medical issues which prohibited therapy per RN, patient remains medically complicated, have been having issues with poor ABGs leading to acidotic state and as well as issues with patient refusing to wear bipap. Recommends hold for today due to medical status. Will continue to follow acutely.    Windell Norfolk, DPT, PN1   Supplemental Physical Therapist [facility]    Pager [phone] Acute Rehab Office [phone]

## 2019-06-22 NOTE — Progress Notes (Signed)
PROGRESS NOTE    Kayla Price   MVH:[id number]  DOB: 16-Mar-1956  DOA: 06/01/2019 PCP: Lauree Chandler, NP   Brief Narrative:  Kayla Price 63 year old female with PMH of metastatic breast cancer with metastasis to brain and right pleura, s/p whole brain radiation, HIV, CKD stage II, recently hospitalized 05/27/2019-05/31/2023 acute kidney injury, hypotension and superficial ulcerations of the groin/perineum who presented the next day of discharge 06/01/2019 due to worsening drainage from her known skin ulcerations along with generalized weakness.  She was admitted for hypotension, acute kidney injury and ulcers of her groin and perineum.  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  Hospital course complicated by community-acquired pneumonia, extensive right lower extremity DVT, acute hypoxic respiratory failure.  She was transferred to progressive care unit on 12/17, PCCM consulted, BiPAP initiated, primary oncologist and PMT consulted, since she was doing so poorly, discussions regarding transitioning to comfort care were ongoing.    Subjective: She remains somnolent today.   Assessment & Plan:   Principal Problem:  Acute Toxic encephalopathy suspected to be Cefepime toxicity  -repeat chest x-ray does not show progression of infiltrates and oxygen level is in the 90s and thus I feel that her pneumonia is improving. -  UA was not consistent with a UTI -  MRI shows multiple mets and edema which are improving - EEG showing severe diffuse encephalopathy - Neuro feels that she may have Cefepime toxicity- d/c'd Antibiotics that were started for possible meningitis  Acute hypercarbic respiratory failure - this occurred yesterday afternoon- pH was 7.227 and pCo2 was 64- a BiPAP was started however the patient became agitated with it overnight and the BiPAP was removed and she was placed on high flow O2 and given Ativan- today, her  hypercarbia has progressed- pH is now 7.157 and pCo2 is 80.7- have resumed BiPAP and updated her daughter in regards to her declining condition- her daughter understands that the patient had chosen to be a DNR when she was more alert and she would like to honor the DNR    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 -This was the reason for her admission -She has completed a course of Valtrex    AKI (acute kidney injury)  -CKD 2/3  -Renal ultrasound 12/3 was unrevealing -This has resolved  Hypernatremia  - she is not eating/drinking thus needs maintenance fluids- cont D5W  Elevated TSH - TSH was 19.709 and when rechecked was 23.171 - Free T 4 waas 0.70- cont Synthroid IV  Hypoxic respiratory failure secondary to community-acquired pneumonia -Patient required treatment with BiPAP starting on 12/18 and has been weaned down now to nasal cannula - she appears to be comfortable at rest - Continue to wean oxygen as able - She is being treated with IV antibiotics including Vanc and cefepime   -  MRSA PCR screen was negative and therefore vancomycin was discontinued on 12/23   - Due to suspected Cefepime toxicity, have d/c'd Cefepime as well- she has received 7 days of Vanc/Cefepime - she still has a RLL infiltrate but we have been able to wean down O2  HIV -Relatively well controlled with a CD4 count greater than 400 on 10/26 -She will need to follow-up with ID as outpatient in 4 to 6 weeks - I spoke with Dr Drucilla Schmidt on 12/24 who recommended that I stop Selzentry - due to lethargy (form Cefepime toxicity), she has not been able to take her  medications  Extensive left leg DVT -Has been placed on Lovenox - due to interactions with HIV meds, she will need to be transitioned to a NOAC when alert enough to swallow  Metastatic Breast cancer-mets to the brain and right pleural space - is status post post whole brain radiation 11/20, chemotherapy and right modified radical  mastectomy -There is a plan for repeat PET scan as outpatient-she is followed by Dr. Jana Hakim who has been seeing her in the hospital as well  Time spent in minutes: 35 DVT prophylaxis: Lovenox Code Status: DO NOT RESUSCITATE Family Communication: daughter Photographer Disposition Plan: To be determined  Consultants:   Oncology  Critical care  Infectious disease  Palliative care Procedures:   BiPAP  Catheter Antimicrobials:  Anti-infectives (From admission, onward)   Start     Dose/Rate Route Frequency Ordered Stop   06/19/19 1000  acyclovir (ZOVIRAX) 800 mg in dextrose 5 % 150 mL IVPB  Status:  Discontinued     800 mg 166 mL/hr over 60 Minutes Intravenous Every 8 hours 06/19/19 0627 06/19/19 1345   06/19/19 0000  ampicillin (OMNIPEN) 1 g in sodium chloride 0.9 % 100 mL IVPB  Status:  Discontinued     1 g 300 mL/hr over 20 Minutes Intravenous Every 6 hours 06/18/19 2117 06/18/19 2155   06/18/19 2300  ampicillin (OMNIPEN) 2 g in sodium chloride 0.9 % 100 mL IVPB  Status:  Discontinued     2 g 300 mL/hr over 20 Minutes Intravenous Every 4 hours 06/18/19 2155 06/19/19 1345   06/18/19 2230  vancomycin (VANCOCIN) IVPB 1000 mg/200 mL premix  Status:  Discontinued     1,000 mg 200 mL/hr over 60 Minutes Intravenous Every 12 hours 06/18/19 2147 06/19/19 1345   06/18/19 2230  cefTRIAXone (ROCEPHIN) 2 g in sodium chloride 0.9 % 100 mL IVPB  Status:  Discontinued     2 g 200 mL/hr over 30 Minutes Intravenous Every 12 hours 06/18/19 2147 06/19/19 1351   06/18/19 2200  acyclovir (ZOVIRAX) 800 mg in dextrose 5 % 150 mL IVPB  Status:  Discontinued     800 mg 166 mL/hr over 60 Minutes Intravenous Every 8 hours 06/18/19 2119 06/19/19 0627   06/18/19 2200  cefTRIAXone (ROCEPHIN) 1 g in sodium chloride 0.9 % 100 mL IVPB  Status:  Discontinued     1 g 200 mL/hr over 30 Minutes Intravenous Every 24 hours 06/18/19 2125 06/18/19 2147   06/18/19 2130  cefTRIAXone (ROCEPHIN) 1 g in sodium  chloride 0.9 % 100 mL IVPB  Status:  Discontinued     1 g 200 mL/hr over 30 Minutes Intravenous Every 24 hours 06/18/19 2117 06/18/19 2125   06/15/19 1000  vancomycin (VANCOREADY) IVPB 1500 mg/300 mL  Status:  Discontinued     1,500 mg 150 mL/hr over 120 Minutes Intravenous Every 24 hours 06/14/19 1017 06/17/19 1139   06/14/19 1800  vancomycin (VANCOREADY) IVPB 1500 mg/300 mL  Status:  Discontinued     1,500 mg 150 mL/hr over 120 Minutes Intravenous Every 24 hours 06/14/19 0924 06/14/19 1017   06/14/19 1400  ceFEPIme (MAXIPIME) 2 g in sodium chloride 0.9 % 100 mL IVPB  Status:  Discontinued     2 g 200 mL/hr over 30 Minutes Intravenous Every 8 hours 06/14/19 0924 06/18/19 2117   06/13/19 1800  vancomycin (VANCOREADY) IVPB 1250 mg/250 mL  Status:  Discontinued     1,250 mg 166.7 mL/hr over 90 Minutes Intravenous Every 24 hours 06/12/19 1506  06/14/19 0924   06/13/19 1130  ceFEPIme (MAXIPIME) 2 g in sodium chloride 0.9 % 100 mL IVPB  Status:  Discontinued     2 g 200 mL/hr over 30 Minutes Intravenous Every 12 hours 06/13/19 1159 06/14/19 0924   06/12/19 1800  vancomycin (VANCOREADY) IVPB 1750 mg/350 mL     1,750 mg 175 mL/hr over 120 Minutes Intravenous  Once 06/12/19 1506 06/12/19 1940   06/12/19 1600  ceFEPIme (MAXIPIME) 2 g in sodium chloride 0.9 % 100 mL IVPB  Status:  Discontinued     2 g 200 mL/hr over 30 Minutes Intravenous Every 12 hours 06/12/19 1506 06/13/19 1159   06/10/19 0600  cefTRIAXone (ROCEPHIN) 1 g in sodium chloride 0.9 % 100 mL IVPB  Status:  Discontinued     1 g 200 mL/hr over 30 Minutes Intravenous Every 24 hours 06/09/19 0732 06/12/19 1335   06/09/19 0800  doxycycline (VIBRAMYCIN) 100 mg in sodium chloride 0.9 % 250 mL IVPB  Status:  Discontinued     100 mg 125 mL/hr over 120 Minutes Intravenous Every 12 hours 06/09/19 0735 06/12/19 1335   06/09/19 0745  cefTRIAXone (ROCEPHIN) 1 g in sodium chloride 0.9 % 100 mL IVPB     1 g 200 mL/hr over 30 Minutes Intravenous   Once 06/09/19 0738 06/09/19 0924   06/05/19 2200  valACYclovir (VALTREX) tablet 1,000 mg     1,000 mg Oral 2 times daily 06/05/19 1418 06/12/19 1101   06/05/19 1000  valACYclovir (VALTREX) tablet 1,000 mg  Status:  Discontinued     1,000 mg Oral 3 times daily 06/05/19 0834 06/05/19 1418   06/02/19 2200  darunavir-cobicistat (PREZCOBIX) 800-150 MG per tablet 1 tablet     1 tablet Oral Daily at bedtime 06/02/19 0950     06/02/19 2200  dolutegravir (TIVICAY) tablet 50 mg     50 mg Oral Daily at bedtime 06/02/19 1455     06/02/19 2200  rilpivirine (EDURANT) tablet 25 mg     25 mg Oral Daily at bedtime 06/02/19 1455     06/02/19 1700  dolutegravir (TIVICAY) tablet 50 mg  Status:  Discontinued     50 mg Oral Daily with supper 06/02/19 1453 06/02/19 1456   06/02/19 1700  rilpivirine (EDURANT) tablet 25 mg  Status:  Discontinued     25 mg Oral Daily with supper 06/02/19 1453 06/02/19 1456   06/02/19 1415  fluconazole (DIFLUCAN) IVPB 200 mg  Status:  Discontinued     200 mg 100 mL/hr over 60 Minutes Intravenous Every 24 hours 06/02/19 1402 06/05/19 0834   06/02/19 1100  maraviroc (SELZENTRY) tablet 150 mg  Status:  Discontinued     150 mg Oral 2 times daily 06/02/19 0950 06/18/19 1443   06/02/19 1000  cefdinir (OMNICEF) capsule 300 mg     300 mg Oral 2 times daily 06/02/19 0950 06/04/19 2207   06/02/19 1000  fluconazole (DIFLUCAN) tablet 400 mg  Status:  Discontinued     400 mg Oral Daily 06/02/19 0950 06/02/19 1402       Objective: Vitals:   06/21/19 2119 06/21/19 2147 06/22/19 0300 06/22/19 0900  BP: 103/76 103/76 (!) 133/98 115/78  Pulse:  89 (!) 103   Resp:  (!) 25 (!) 25   Temp: (!) 97.3 F (36.3 C)  98 F (36.7 C)   TempSrc: Axillary  Axillary   SpO2:   98% 97%  Weight:   (!) 138.3 kg   Height:  Intake/Output Summary (Last 24 hours) at 06/22/2019 1027 Last data filed at 06/22/2019 0426 Gross per 24 hour  Intake 2338.27 ml  Output 200 ml  Net 2138.27 ml   Filed  Weights   06/19/19 0500 06/20/19 0630 06/22/19 0300  Weight: 131.5 kg 132.5 kg (!) 138.3 kg    Examination: General exam: Appears comfortable - lethargic HEENT: PERRLA, oral mucosa moist, no sclera icterus or thrush Respiratory system: rhonchi, abdominal breathing, tachypnea Cardiovascular system: S1 & S2 heard,  No murmurs  Gastrointestinal system: Abdomen soft, non-tender, nondistended. Normal bowel sounds   Central nervous system: lethargic Extremities: No cyanosis, clubbing or edema Skin: No rashes or ulcers  Data Reviewed: I have personally reviewed following labs and imaging studies  CBC: Recent Labs  Lab 06/18/19 1030 06/19/19 1635 06/20/19 0610 06/21/19 0645 06/22/19 0429  WBC 14.3* 14.1* 18.2* 18.5* 16.2*  HGB 8.3* 8.0* 8.7* 8.2* 8.5*  HCT 26.8* 25.6* 29.0* 28.1* 28.5*  MCV 100.4* 102.8* 103.2* 108.5* 107.1*  PLT 234 230 260 254 132   Basic Metabolic Panel: Recent Labs  Lab 06/18/19 1030 06/19/19 0358 06/20/19 0610 06/21/19 0645 06/22/19 0429  NA 141 142 145 148* 146*  K 3.2* 3.4* 3.6 4.3 3.7  CL 105 107 112* 117* 114*  CO2 27 28 26 27 26   GLUCOSE 91 107* 146* 132* 121*  BUN 19 19 17 18 16   CREATININE 1.03* 0.95 0.98 1.00 1.03*  CALCIUM 9.0 8.8* 8.6* 8.3* 8.2*   GFR: Estimated Creatinine Clearance: 81.5 mL/min (A) (by C-G formula based on SCr of 1.03 mg/dL (H)). Liver Function Tests: Recent Labs  Lab 06/16/19 0500  AST 29  ALT 21  ALKPHOS 53  BILITOT 0.5  PROT 5.3*  ALBUMIN 1.9*   No results for input(s): LIPASE, AMYLASE in the last 168 hours. Recent Labs  Lab 06/18/19 1721  AMMONIA 19   Coagulation Profile: No results for input(s): INR, PROTIME in the last 168 hours. Cardiac Enzymes: No results for input(s): CKTOTAL, CKMB, CKMBINDEX, TROPONINI in the last 168 hours. BNP (last 3 results) No results for input(s): PROBNP in the last 8760 hours. HbA1C: No results for input(s): HGBA1C in the last 72 hours. CBG: Recent Labs  Lab  06/17/19 2208 06/19/19 0739  GLUCAP 99 85   Lipid Profile: No results for input(s): CHOL, HDL, LDLCALC, TRIG, CHOLHDL, LDLDIRECT in the last 72 hours. Thyroid Function Tests: Recent Labs    06/19/19 1634 06/19/19 1635  TSH 23.171*  --   FREET4  --  0.70   Anemia Panel: No results for input(s): VITAMINB12, FOLATE, FERRITIN, TIBC, IRON, RETICCTPCT in the last 72 hours. Urine analysis:    Component Value Date/Time   COLORURINE AMBER (A) 06/18/2019 1112   APPEARANCEUR CLOUDY (A) 06/18/2019 1112   LABSPEC 1.020 06/18/2019 1112   PHURINE 5.0 06/18/2019 1112   GLUCOSEU NEGATIVE 06/18/2019 1112   GLUCOSEU NEG mg/dL 07/23/2006 2113   HGBUR MODERATE (A) 06/18/2019 1112   BILIRUBINUR NEGATIVE 06/18/2019 1112   KETONESUR 5 (A) 06/18/2019 1112   PROTEINUR 100 (A) 06/18/2019 1112   UROBILINOGEN 1 07/23/2006 2113   NITRITE NEGATIVE 06/18/2019 1112   LEUKOCYTESUR NEGATIVE 06/18/2019 1112   Sepsis Labs: @LABRCNTIP (procalcitonin:4,lacticidven:4) ) Recent Results (from the past 240 hour(s))  MRSA PCR Screening     Status: None   Collection Time: 06/16/19  5:30 PM   Specimen: Nasal Mucosa; Nasopharyngeal  Result Value Ref Range Status   MRSA by PCR NEGATIVE NEGATIVE Final    Comment:  The GeneXpert MRSA Assay (FDA approved for NASAL specimens only), is one component of a comprehensive MRSA colonization surveillance program. It is not intended to diagnose MRSA infection nor to guide or monitor treatment for MRSA infections. Performed at [facility], [place] [address], [place], [place] [address]   Culture, Urine     Status: None   Collection Time: 06/18/19 11:12 AM   Specimen: Urine, Random  Result Value Ref Range Status   Specimen Description URINE, RANDOM  Final   Special Requests NONE  Final   Culture   Final    NO GROWTH Performed at [facility], [place] [address], [place], [place] [address]    Report Status 06/19/2019 FINAL  Final         Radiology  Studies: EEG  Result Date: 06/21/2019 Greta Doom, MD     06/21/2019 12:38 PM Patient Name: Kayla Price MRN: [id number] EEG Attending: Roland Rack Referring Physician/Provider: Roland Rack Date: 06/21/2019 Duration: 24 minutes Patient history: 63 yo F with multiple intracranial metastasis with encephalopathy. Recent EEG with triphasic waves. Level of alertness: Encephalopathic AEDs during EEG study: Keppra 500mg  BID Technical aspects: This EEG study was done with scalp electrodes positioned according to the 10-20 International system of electrode placement. Electrical activity was acquired at a sampling rate of 500Hz  and reviewed with a high frequency filter of 70Hz  and a low frequency filter of 1Hz . EEG data were recorded continuously and digitally stored. BACKGROUND ACTIVITY:  The background consist predominately of generalized irregular slow activity with some frontocentral predominant beta range activity.  There are poorly formed sleep structures at times.  No definite posterior dominant rhythm was seen, however following stimulation there was briefly a posteriorly predominant rhythm of 8 Hz but this was very briefly sustained. EPILEPTIFORM ACTIVITY: Interictal epileptiform activity: None Ictal Activity: None OTHER EVENTS: None SLEEP RECORDINGS: Only awake and drowsy states were recorded. Drowsiness was characterized by attenuation of the posterior background rhythm. ACTIVATION PROCEDURES: Hyperventilation and photic stimulation were not performed. IMPRESSION: This EEG is consistent with a generalized nonspecific cerebral dysfunction.  There has been significant improvement since the previous recording and that no triphasic waves are seen in the study. There was no seizure or evidence of seizure predisposition recorded on this study. Please note that lack of epileptiform activity on EEG does not preclude the possibility of epilepsy.  Roland Rack, MD [facility] [phone] If 7pm- 7am, please page neurology on call as listed in [place].      Scheduled Meds: . Chlorhexidine Gluconate Cloth  6 each Topical Daily  . darunavir-cobicistat  1 tablet Oral QHS  . dolutegravir  50 mg Oral QHS   And  . rilpivirine  25 mg Oral QHS  . enoxaparin (LOVENOX) injection  50 mg Subcutaneous Q12H  . feeding supplement (ENSURE ENLIVE)  237 mL Oral TID BM  . levothyroxine  100 mcg Intravenous Daily  . multivitamin with minerals  1 tablet Oral Daily   Continuous Infusions: . sodium chloride Stopped (06/22/19 0036)  . dextrose 100 mL/hr at 06/22/19 0035  . levETIRAcetam 250 mg (06/21/19 2159)     LOS: 19 days      Debbe Odea, MD [facility] Pager: [email or web address] Password [facility] 06/22/2019, 10:27 AM

## 2019-06-22 NOTE — Progress Notes (Signed)
RN paged NP regarding patient's daughter's wish to re order morphine 2mg  Q2 hrs PRN.  Daughter had requested morphine be discontinued yesterday, but after spending the day with her mother and seeing how uncomfortable she is, she asked nurse to have it reinstated to make her mother more comfortable.

## 2019-06-22 NOTE — Progress Notes (Signed)
Pt had burst of SVT 12/28 around 6:15

## 2019-06-22 NOTE — Significant Event (Addendum)
Rapid Response Event Note  Overview: ABG result of pH 7.15, pCO2 80.7, pO2 120, HCO3 27.6  Time Called: 0910 Arrival Time: 0910 Event Type: Respiratory, Neurologic   Initial Focused Assessment: Patient lying in bed. Minimal response to pain and minimal gag flex. Patient unable to open eyes. Pt is moaning and head bobbing rhythmically with respirations. Patient oxygen saturation 94% on 2LNC. Lung sounds are rhonchus.   Interventions: BiPAP 30% 16/6. MD to evaluate patient.   Plan of Care (if not transferred): Plan to reassess respiratory status and mentation after 1 hour on BIPAP. MD to call and update patient's family and determine goals of care.    Pt appears more comfortable on BiPAP. Still has abdominal breathing. Pt no longer moaning. Pulling 360 TV.   Event Summary: Name of Physician Notified: Dr. Wynelle Cleveland at [phone] Dr. Wynelle Cleveland paged at 0910 ABG results. Recommended patient be placed back on BiPAP. MD notified again at [place] regarding patient moaning, abdominal breathing, inspiratory stacking, minimal gag response, and minimal pain response. MD called RN back at [phone] with updated plan of care.   Respiratory therapy notified that patient was being placed on BiPAP at 0939.   Outcome: Stayed in room and stabalized Event End Time: 0945   Rounded back on patient around 1230/1300. Patient remains on BiPAP. RR 35, per primary RN patient's RR has been teetering from 18-35. RR is noted to increase when patient is stimulated. Pt does not appear to be in distress. SpO2 95%. Repeat ABG to be completed at 1300. Pt moans to verbal stimuli, does not follow commands.  Casimer Bilis

## 2019-06-22 NOTE — Progress Notes (Signed)
Daily Progress Note   Patient Name: Kayla Price       Date: 06/22/2019 DOB: 1955/12/31  Age: 63 y.o. MRN#: [id number] Attending Physician: Debbe Odea, MD Primary Care Physician: Lauree Chandler, NP Admit Date: 06/01/2019  Reason for Consultation/Follow-up: Establishing goals of care and Psychosocial/spiritual support  Subjective: Discussed patient with Dr. Wynelle Cleveland.  Patient is too lethargic to eat.  She has not eaten in a weak.  Her mental status has remained altered since 12/23.  When I visit her she does not open her eyes and only moans.  She is on BiPAP.   Altered mental status is of uncertain etiology but work up is negative which leads the medical team to believe she could have had cefepime toxicity.  Cefepime discontinued.  Per Dr. Virgie Dad note (prior to the altered mental status) patient was to have an outpatient follow up PET scan and follow up with him, but it was uncertain whether or not she would be able to tolerate further systemic therapy.  I attempted to call the patient's daughter.  There was no answer and the voice mail was full.     Assessment: Patient on BiPAP (and desaturating).  Eyes closed.  Only moaning in response to my voice and exam.  Can no seem to wean from intermittent BiPAP after 21 days of hospitalization.  No longer eating.   Patient Profile/HPI:  63 yo female with metastatic breast cancer to the brain and lung s/p whole brain radiation.  She has severe pulmonary HTN on echo with right sided heart failure.  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  She has developed worsening pneumonia, RLE DVT and altered mental status during this admission.  Current albumin is 1.9 (12/22)    Length of Stay: 19  Current  Medications: Scheduled Meds:  . Chlorhexidine Gluconate Cloth  6 each Topical Daily  . darunavir-cobicistat  1 tablet Oral QHS  . dolutegravir  50 mg Oral QHS   And  . rilpivirine  25 mg Oral QHS  . enoxaparin (LOVENOX) injection  50 mg Subcutaneous Q12H  . feeding supplement (ENSURE ENLIVE)  237 mL Oral TID BM  . levothyroxine  100 mcg Intravenous Daily  . multivitamin with minerals  1 tablet Oral Daily    Continuous Infusions: .  sodium chloride Stopped (06/22/19 0036)  . dextrose 100 mL/hr at 06/22/19 0035  . levETIRAcetam 250 mg (06/22/19 1147)    PRN Meds: sodium chloride, acetaminophen **OR** acetaminophen, calcium carbonate (dosed in mg elemental calcium), camphor-menthol **AND** hydrOXYzine, docusate sodium, HYDROcodone-acetaminophen, LORazepam, ondansetron **OR** ondansetron (ZOFRAN) IV, sodium chloride flush, sorbitol, zolpidem  Physical Exam    Well developed but chronically ill appearing female on BiPAP CV tachy and irregular Resp on BiPAP  Abdomen soft, nt, nd Neuro moaning, does not open eyes or respond to my voice.  Vital Signs: BP 123/83 (BP Location: Left Arm)   Pulse (!) 114   Temp 97.7 F (36.5 C) (Axillary)   Resp (!) 23   Ht 5\' 7"  (1.702 m)   Wt (!) 138.3 kg   LMP 11/06/2003   SpO2 100%   BMI 47.77 kg/m  SpO2: SpO2: 100 % O2 Device: O2 Device: Bi-PAP O2 Flow Rate: O2 Flow Rate (L/min): 2 L/min  Intake/output summary:   Intake/Output Summary (Last 24 hours) at 06/22/2019 1704 Last data filed at 06/22/2019 1030 Gross per 24 hour  Intake 1557.55 ml  Output 650 ml  Net 907.55 ml   LBM: Last BM Date: 06/21/19 Baseline Weight: Weight: 90.7 kg Most recent weight: Weight: (!) 138.3 kg       Palliative Assessment/Data: 10%    Flowsheet Rows     Most Recent Value  Intake Tab  Referral Department  -- [hematology]  Unit at Time of Referral  Other (Comment) [renal]  Palliative Care Primary Diagnosis  Cancer  Date Notified  06/12/19   Palliative Care Type  New Palliative care  Reason for referral  Clarify Goals of Care  Date of Admission  06/01/19  Date first seen by Palliative Care  06/13/19  # of days Palliative referral response time  1 Day(s)  # of days IP prior to Palliative referral  11  Clinical Assessment  Psychosocial & Spiritual Assessment  Palliative Care Outcomes      Patient Active Problem List   Diagnosis Date Noted  . Right lower lobe pneumonia 06/09/2019  . Pressure injury of skin 06/09/2019  . Acute hypoxemic respiratory failure ([place]) 06/09/2019  . Right leg DVT ([place]) 06/09/2019  . Edema of right lower extremity 06/08/2019  . 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 Intertrigo 06/04/2019  . Pancytopenia ([place]) 06/04/2019  . Malnutrition of moderate degree 06/03/2019  . Alteration in skin integrity due to moisture 06/02/2019  . DNR (do not resuscitate) 06/02/2019  . AKI (acute kidney injury) ([place]) 05/28/2019  . Hyperkalemia 05/28/2019  . UTI (urinary tract infection) 05/28/2019  . Hypotension 05/28/2019  . Acute kidney injury superimposed on CKD ([place]) 05/28/2019  . Brain metastasis ([place]) 04/28/2019  . Hypothyroidism 04/28/2019  . Anemia of chronic disease 04/28/2019  . Encounter for antineoplastic chemotherapy 02/06/2019  . Port-A-Cath in place 08/01/2018  . Goals of care, counseling/discussion 06/26/2018  . Recurrent right pleural effusion 05/30/2018  . Malignant pleural effusion 05/19/2018  . Hilar adenopathy 05/15/2018  . Pleural effusion on right 04/25/2018  . Seasonal allergic rhinitis due to pollen 01/16/2018  . Aortic atherosclerosis ([place]) 11/11/2017  . Morbid obesity with BMI of 40.0-44.9, adult ([place]) 11/11/2017  . Genetic testing 06/27/2017  . Family history of non-Hodgkin's lymphoma   . Family history of lung cancer   . Endometrial thickening on ultrasound 01/25/2017  . Metastatic breast cancer ([place]) 11/05/2016  . Malignant neoplasm of upper-outer quadrant  of right breast in female,  estrogen receptor negative ([place]) 10/09/2016  . Primary localized osteoarthritis of left knee 02/06/2016  . Primary osteoarthritis of left knee 02/05/2016  . Osteoarthritis of left knee 06/08/2015  . CKD (chronic kidney disease), stage III 06/08/2015  . Vitamin D deficiency 09/23/2014  . Acute renal insufficiency 04/23/2014  . 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 Bell's palsy 12/04/2012  . Contact dermatitis 08/29/2011  . Dry eyes 08/20/2011  . Dry mouth 08/20/2011  . Neuropathy 04/09/2011  . Insomnia 04/09/2011  . Obesities, morbid ([place]) 09/27/2010  . Endometrial polyp 12/22/2009  . Alopecia areata 11/28/2009  . MENORRHAGIA, POSTMENOPAUSAL 02/03/2009  . ABNORMAL GLANDULAR PAPANICOLAOU SMEAR OF CERVIX 11/04/2008  . ALOPECIA 05/06/2008  . Trigger finger, acquired 05/06/2008  . HIP FRACTURE, RIGHT 05/06/2008  . ARTHROSCOPY, LEFT KNEE, HX OF 02/03/2008  . HYPERLIPIDEMIA, MIXED 12/15/2007  . HAND PAIN, RIGHT 12/15/2007  . Other abnormal glucose 12/15/2007  . UNSPECIFIED VITAMIN D DEFICIENCY 08/06/2007  . TINEA CAPITIS 08/04/2007  . CNTC DERMATITIS&OTH ECZEMA DUE OTH CHEM PRODUCTS 08/04/2007  . PVD 04/07/2007  . Secondary localized osteoarthrosis 04/07/2007  . Human immunodeficiency virus (HIV) disease ([place]) 03/27/2006  . HTN (hypertension) 03/27/2006  . BREAST CANCER, HX OF 03/27/2006    Palliative Care Plan    Recommendations/Plan:  PMT will follow with you.  Dr. Jana Hakim has had an excellent relationship with this patient.  I would recommend he weigh in on her prognosis given her development of AMS 12/23 and lack of improvement.  I believe the family would greatly value his opinion.  Code Status:  DNR  Prognosis:   Less than two weeks given AMS, not eating or drinking, requiring BiPAP   Discharge Planning:  Anticipated Hospital Death vs Hospice House if her mental status does not improve.  Care plan was discussed with Dr. Wynelle Cleveland.   Thank you for allowing the Palliative Medicine Team to assist in the care of this patient.  Total time spent:  35 min.     Greater than 50%  of this time was spent counseling and coordinating care related to the above assessment and plan.  Florentina Jenny, PA-C Palliative Medicine  Please contact Palliative MedicineTeam phone at [phone] for questions and concerns between 7 am - 7 pm.   Please see AMION for individual provider pager numbers.

## 2019-06-22 NOTE — Progress Notes (Signed)
RT NOTE: RT obtianed ABG and sent to lab. RT called lab to let them know the ABG was coming and that it was stat. RT put phone number to call results to on ABG paper with information. Lab did not call RT with results. Rapid was in patient's room and placed patient on bipap upon RT arrival. Patient is tolerating bipap well. RT will continue to monitor.

## 2019-06-22 NOTE — Progress Notes (Signed)
Neurology Progress Note   S:// Remains encephalopathic. Was refusing bipap last night and was agitated. Received ativan at 0600  O:// Current vital signs: BP (!) 133/98 (BP Location: Left Arm)   Pulse (!) 103   Temp 98 F (36.7 C) (Axillary)   Resp (!) 25   Ht 5\' 7"  (1.702 m)   Wt (!) 138.3 kg   LMP 11/06/2003   SpO2 98%   BMI 47.77 kg/m  Vital signs in last 24 hours: Temp:  [97.3 F (36.3 C)-98 F (36.7 C)] 98 F (36.7 C) (12/28 0300) Pulse Rate:  [89-103] 103 (12/28 0300) Resp:  [14-25] 25 (12/28 0300) BP: (103-133)/(76-98) 133/98 (12/28 0300) SpO2:  [98 %-100 %] 98 % (12/28 0300) FiO2 (%):  [40 %-57 %] 57 % (12/27 2341) Weight:  [138.3 kg] 138.3 kg (12/28 0300) LIMITED EXAM - ATIVAN GIVEN couple of hours before exam Gen: lethargic HEENT: [place] AT CVS: RRR Resp: breathing well and saturating normally on [place]. Refused bipap overnight NEURO:  Mental Status: lethargic and drowsy. Moaning unintelligible words. Not following commands Cranial Nerves: PERRL. No gaze deivation or preference, visual field exam difficult to perform due to mentation, face syymmetric Motor: no spontaneous movements. To nox stim, grimaces equally Sensory as above  Medications  Current Facility-Administered Medications:  .  0.9 %  sodium chloride infusion, , Intravenous, PRN, Modena Jansky, MD, Stopped at 06/22/19 0036 .  acetaminophen (TYLENOL) tablet 650 mg, 650 mg, Oral, Q6H PRN, 650 mg at 06/02/19 1142 **OR** acetaminophen (TYLENOL) suppository 650 mg, 650 mg, Rectal, Q6H PRN, Jonelle Sidle, Mohammad L, MD .  calcium carbonate (dosed in mg elemental calcium) suspension 500 mg of elemental calcium, 500 mg of elemental calcium, Oral, Q6H PRN, Jonelle Sidle, Mohammad L, MD .  camphor-menthol (SARNA) lotion 1 application, 1 application, Topical, H8E PRN **AND** hydrOXYzine (ATARAX/VISTARIL) tablet 25 mg, 25 mg, Oral, Q8H PRN, Jonelle Sidle, Mohammad L, MD .  Chlorhexidine Gluconate Cloth 2 % PADS 6 each, 6 each, Topical,  Daily, Vann, Jessica U, DO, 6 each at 06/21/19 0700 .  darunavir-cobicistat (PREZCOBIX) 800-150 MG per tablet 1 tablet, 1 tablet, Oral, QHS, Vann, Jessica U, DO, 1 tablet at 06/16/19 2122 .  dextrose 5 % solution, , Intravenous, Continuous, Rizwan, Saima, MD, Last Rate: 100 mL/hr at 06/22/19 0035, New Bag at 06/22/19 0035 .  docusate sodium (ENEMEEZ) enema 283 mg, 1 enema, Rectal, PRN, Elwyn Reach, MD .  dolutegravir (TIVICAY) tablet 50 mg, 50 mg, Oral, QHS, 50 mg at 06/16/19 2122 **AND** rilpivirine (EDURANT) tablet 25 mg, 25 mg, Oral, QHS, Susa Raring, RPH, 25 mg at 06/16/19 2123 .  enoxaparin (LOVENOX) injection 50 mg, 50 mg, Subcutaneous, Q12H, Rizwan, Saima, MD, 50 mg at 06/22/19 0552 .  feeding supplement (ENSURE ENLIVE) (ENSURE ENLIVE) liquid 237 mL, 237 mL, Oral, TID BM, Domenic Polite, MD, 237 mL at 06/17/19 1506 .  HYDROcodone-acetaminophen (NORCO/VICODIN) 5-325 MG per tablet 1 tablet, 1 tablet, Oral, Q6H PRN, Geradine Girt, DO, 1 tablet at 06/11/19 0222 .  levETIRAcetam (KEPPRA) 250 mg in sodium chloride 0.9 % 100 mL IVPB, 250 mg, Intravenous, Q12H, Greta Doom, MD, Last Rate: 410 mL/hr at 06/21/19 2159, 250 mg at 06/21/19 2159 .  levothyroxine (SYNTHROID, LEVOTHROID) injection 100 mcg, 100 mcg, Intravenous, Daily, Rizwan, Saima, MD, 100 mcg at 06/21/19 1016 .  LORazepam (ATIVAN) injection 1 mg, 1 mg, Intravenous, Q6H PRN, Rizwan, Saima, MD, 1 mg at 06/22/19 0600 .  multivitamin with minerals tablet 1 tablet, 1 tablet,  Oral, Daily, Domenic Polite, MD, 1 tablet at 06/17/19 [phone] .  ondansetron (ZOFRAN) tablet 4 mg, 4 mg, Oral, Q6H PRN **OR** ondansetron (ZOFRAN) injection 4 mg, 4 mg, Intravenous, Q6H PRN, Garba, Mohammad L, MD .  sodium chloride flush (NS) 0.9 % injection 10-40 mL, 10-40 mL, Intracatheter, PRN, Magrinat, Virgie Dad, MD, 10 mL at 06/10/19 0406 .  sorbitol 70 % solution 30 mL, 30 mL, Oral, PRN, Gala Romney L, MD .  zolpidem (AMBIEN) tablet 5 mg, 5  mg, Oral, QHS PRN, Gala Romney L, MD, 5 mg at 06/11/19 2143 Labs CBC    Component Value Date/Time   WBC 16.2 (H) 06/22/2019 0429   RBC 2.66 (L) 06/22/2019 0429   HGB 8.5 (L) 06/22/2019 0429   HGB 11.9 (L) 04/17/2019 1304   HGB 13.1 05/13/2017 0924   HCT 28.5 (L) 06/22/2019 0429   HCT 40.3 05/13/2017 0924   PLT 247 06/22/2019 0429   PLT 340 04/17/2019 1304   PLT 158 05/13/2017 0924   MCV 107.1 (H) 06/22/2019 0429   MCV 100.6 05/13/2017 0924   MCH 32.0 06/22/2019 0429   MCHC 29.8 (L) 06/22/2019 0429   RDW 24.4 (H) 06/22/2019 0429   RDW 16.0 (H) 05/13/2017 0924   LYMPHSABS 0.5 (L) 06/01/2019 1825   LYMPHSABS 1.2 05/13/2017 0924   MONOABS 0.2 06/01/2019 1825   MONOABS 0.3 05/13/2017 0924   EOSABS 0.0 06/01/2019 1825   EOSABS 0.1 05/13/2017 0924   BASOSABS 0.0 06/01/2019 1825   BASOSABS 0.0 05/13/2017 0924    CMP     Component Value Date/Time   NA 146 (H) 06/22/2019 0429   NA 140 05/13/2017 0927   K 3.7 06/22/2019 0429   K 4.1 05/13/2017 0927   CL 114 (H) 06/22/2019 0429   CO2 26 06/22/2019 0429   CO2 29 05/13/2017 0927   GLUCOSE 121 (H) 06/22/2019 0429   GLUCOSE 102 05/13/2017 0927   BUN 16 06/22/2019 0429   BUN 17.7 05/13/2017 0927   CREATININE 1.03 (H) 06/22/2019 0429   CREATININE 1.35 (H) 04/17/2019 1304   CREATININE 1.62 (H) 09/18/2018 0911   CREATININE 1.4 (H) 05/13/2017 0927   CALCIUM 8.2 (L) 06/22/2019 0429   CALCIUM 10.5 (H) 05/13/2017 0927   PROT 5.3 (L) 06/16/2019 0500   PROT 8.1 05/13/2017 0927   ALBUMIN 1.9 (L) 06/16/2019 0500   ALBUMIN 3.5 05/13/2017 0927   AST 29 06/16/2019 0500   AST 39 04/17/2019 1304   AST 14 05/13/2017 0927   ALT 21 06/16/2019 0500   ALT 12 04/17/2019 1304   ALT 12 05/13/2017 0927   ALKPHOS 53 06/16/2019 0500   ALKPHOS 90 05/13/2017 0927   BILITOT 0.5 06/16/2019 0500   BILITOT 0.3 04/17/2019 1304   BILITOT 0.27 05/13/2017 0927   GFRNONAA 58 (L) 06/22/2019 0429   GFRNONAA 42 (L) 04/17/2019 1304   GFRNONAA 34 (L)  09/18/2018 0911   GFRAA >60 06/22/2019 0429   GFRAA 48 (L) 04/17/2019 1304   GFRAA 39 (L) 09/18/2018 0911    Imaging I have reviewed images in epic and the results pertinent to this consultation are:  MRI examination of the brain-12/24 - multiple mets to the brain, predominantly posterior in location. Decreased vasogenic edema compared to prior scan.  Assessment: 63/F with AMS - likely cefepime toxicity in the setting of renal derangement. Was admitted with HSV-2 infection, has h/o breast Ca, brain mets. On ABG yesterday, was noted to be acidotic and retaining CO2.  BiPAP started, discontinued for  some reason and has more CO2 retention. Clinical presentation likely secondary to multitude of derangements including CO2 retention, acidosis, deranged renal function which seems to be improving, and existing brain mets with poor brain reserve. 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  Impression: Multifactorial toxic metabolic encephalopathy  Recommendations: Minimize sedating medications. Optimize oxygenation per primary team as you are. Supportive care per primary team as you are Do not see any need for any emergent neurological intervention at this time. Neurology will be available as needed Plan discussed with Dr. Wynelle Cleveland over the phone  -- Amie Portland, MD [facility] Pager: [phone] If 7pm to 7am, please call on call as listed on AMION.

## 2019-06-23 LAB — CBC
HCT: 30.5 % — ABNORMAL LOW (ref 36.0–46.0)
Hemoglobin: 9.4 g/dL — ABNORMAL LOW (ref 12.0–15.0)
MCH: 32 pg (ref 26.0–34.0)
MCHC: 30.8 g/dL (ref 30.0–36.0)
MCV: 103.7 fL — ABNORMAL HIGH (ref 80.0–100.0)
Platelets: 256 10*3/uL (ref 150–400)
RBC: 2.94 MIL/uL — ABNORMAL LOW (ref 3.87–5.11)
RDW: 24.6 % — ABNORMAL HIGH (ref 11.5–15.5)
WBC: 13.2 10*3/uL — ABNORMAL HIGH (ref 4.0–10.5)
nRBC: 1.4 % — ABNORMAL HIGH (ref 0.0–0.2)

## 2019-06-23 LAB — BLOOD GAS, ARTERIAL
Acid-Base Excess: 1.3 mmol/L (ref 0.0–2.0)
Bicarbonate: 27 mmol/L (ref 20.0–28.0)
Drawn by: 398981
FIO2: 30
O2 Saturation: 98.2 %
Patient temperature: 36.7
pCO2 arterial: 55.5 mmHg — ABNORMAL HIGH (ref 32.0–48.0)
pH, Arterial: 7.307 — ABNORMAL LOW (ref 7.350–7.450)
pO2, Arterial: 101 mmHg (ref 83.0–108.0)

## 2019-06-23 LAB — BASIC METABOLIC PANEL
Anion gap: 7 (ref 5–15)
BUN: 12 mg/dL (ref 8–23)
CO2: 25 mmol/L (ref 22–32)
Calcium: 7.8 mg/dL — ABNORMAL LOW (ref 8.9–10.3)
Chloride: 112 mmol/L — ABNORMAL HIGH (ref 98–111)
Creatinine, Ser: 0.97 mg/dL (ref 0.44–1.00)
GFR calc Af Amer: >60 mL/min (ref >60)
GFR calc non Af Amer: >60 mL/min (ref >60)
Glucose, Bld: 108 mg/dL — ABNORMAL HIGH (ref 70–99)
Potassium: 3.5 mmol/L (ref 3.5–5.1)
Sodium: 144 mmol/L (ref 135–145)

## 2019-06-23 NOTE — Progress Notes (Signed)
[place] for Lovenox Indication: DVT  Allergies  Allergen Reactions  . Lisinopril Anaphylaxis, Swelling and Other (See Comments)    Swelling of tongue and mouth 11/05/16- tolerates Olmesartan  . Pepcid [Famotidine] Other (See Comments)    PPI H2, BLOCKERS LOWER GASTRIC PH WHICH WOULD LEAD TO SUBTHERAPEUTIC RILPIVIRINE LEVELS AND POTENTIAL VIROLOGICAL FAILURE WITH RESISTANCE  . Prilosec [Omeprazole] Other (See Comments)    PPI H2, BLOCKERS LOWER GASTRIC PH WHICH WOULD LEAD TO SUBTHERAPEUTIC RILPIVIRINE LEVELS AND POTENTIAL VIROLOGICAL FAILURE WITH RESISTANCE  . Tums [Calcium Carbonate Antacid] Other (See Comments)    TUMS ANTACIDS CAN LOWER GASTRIC PH WHICH COULD  LEAD TO SUBTHERAPEUTIC RILPIVIRINE LEVELS AND POTENTIAL VIROLOGICAL FAILURE WITH RESISTANCE TUMS CAN BE GIVEN BUT NEED CONSULT WITH [facility] PHARMACY RE TIMING. I PREFER HER TO AVOID ALL TOGETHER    Patient Measurements: Height: 5\' 7"  (170.2 cm) Weight: (!) 302 lb (137 kg) IBW/kg (Calculated) : 61.6  Vital Signs: Temp: 98.1 F (36.7 C) (12/29 0430) Temp Source: Axillary (12/29 0430) BP: 117/81 (12/29 0430) Pulse Rate: 86 (12/29 0430)  Labs: Recent Labs    06/21/19 0645 06/22/19 0429 06/23/19 0522  HGB 8.2* 8.5*  --   HCT 28.1* 28.5*  --   PLT 254 247  --   CREATININE 1.00 1.03* 0.97    Estimated Creatinine Clearance: 86 mL/min (by C-G formula based on SCr of 0.97 mg/dL).   Medical History: Past Medical History:  Diagnosis Date  . AKI (acute kidney injury) ([place]) 06/02/2019  . Alopecia areata 11/28/2009  . Bell's palsy   . Cancer [facility]) 2005   Breast cancer   chemotherapy and radiation  . CKD (chronic kidney disease) stage 3, GFR 30-59 ml/min 06/08/2015  . Dry eye syndrome   . Family history of lung cancer   . Family history of non-Hodgkin's lymphoma   . Fasting hyperglycemia   . Gestational diabetes    2001  . HIP FRACTURE, RIGHT 05/06/2008  . History of kidney stones    . HIV DISEASE 03/27/2006  . HYPERLIPIDEMIA, MIXED 12/15/2007  . HYPERTENSION 03/27/2006  . 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 MENORRHAGIA, POSTMENOPAUSAL 02/03/2009  . Osteoarthritis of left knee 06/08/2015  . OSTEOARTHROSIS, LOCAL, SCND, UNSPC SITE 04/07/2007  . Personal history of chemotherapy 11/2018  . PVD 04/07/2007  . Tinea capitis   . TRIGGER FINGER 05/06/2008  . Unspecified vitamin D deficiency 08/06/2007    [name] Pharmacy consulted for Lovenox therapy.  -low molecular weight heparin level= 1.75  Goal of Therapy:  Anti-Xa lovenox level 0.6-1.2 drawn 4 hrs after dose. Monitor platelets by anticoagulation protocol: Yes   Plan:  -Continue lovenox 50 mg q 12 hrs.  Have been determining dosing from LMWH levels instead of weight based dosing, as weight has been questionable this admission.  LMWH levels on traditional weight based dosing were very elevated. -F/u plans for palliative care.  Kayla Price, [facility] Clinical Pharmacist Phone [phone]  06/23/2019 7:29 AM

## 2019-06-23 NOTE — Progress Notes (Signed)
HEMATOLOGY-ONCOLOGY PROGRESS NOTE  SUBJECTIVE: Events over the past few days have been noted.  Kayla Price remains encephalopathic suspected due to cefepime toxicity.  Cefepime has been discontinued.  When I saw her today, she remains very sleepy.  She did open her eyes and was able to tell me that she was not in any pain. She quickly fell back to sleep.  No family at the bedside.  Oncology History  Malignant neoplasm of upper-outer quadrant of right breast in female, estrogen receptor negative ([place])  03/2004 Surgery   Right lumpectomy and SLNB for IDC, 0.5cm, 1/2 + SLN, grade 3, ER-, PR-, HER-2 -.  Treated with Doxorubicin and Cyclophosphamide x 4, followed by weekly Paclitaxel x 7 and adjuvant radiation.    10/01/2016 Initial Biopsy   Right breast upper outer quadrant biopsy: IDC, DCIS, triple negative, grade 3. Lymph node: Positive   11/05/2016 Surgery   Right modified radical mastectomy: IDC, grade 3, 2.6 cm, +LVI, triple negative, margins negative, 2/10 LN + metastases.  T2, N1a   11/20/2016 - 03/11/2017 Adjuvant Chemotherapy   Gemcitabine/Carboplatin given on days 1 and 8 on a 21 day cycle x 6 cycles   07/15/2018 - 07/15/2018 Chemotherapy   The patient had pembrolizumab (KEYTRUDA) 200 mg in sodium chloride 0.9 % 50 mL chemo infusion, 200 mg, Intravenous, Once, 0 of 6 cycles  for chemotherapy treatment.    07/15/2018 - 01/01/2019 Chemotherapy   The patient had atezolizumab (TECENTRIQ) 840 mg in sodium chloride 0.9 % 250 mL chemo infusion, 840 mg, Intravenous, Once, 6 of 8 cycles Dose modification: 1,680 mg (original dose 840 mg, Cycle 4, Reason: Provider Judgment), 840 mg (original dose 840 mg, Cycle 4, Reason: Provider Judgment) Administration: 840 mg (07/15/2018), 840 mg (08/01/2018), 840 mg (08/15/2018), 840 mg (08/29/2018), 840 mg (09/12/2018), 840 mg (09/26/2018), 840 mg (10/10/2018), 840 mg (10/24/2018), 1,680 mg (11/07/2018), 1,680 mg (12/05/2018)  for chemotherapy treatment.    12/19/2018 -   Chemotherapy   The patient had pegfilgrastim (NEULASTA) injection 6 mg, 6 mg, Subcutaneous, Once, 1 of 1 cycle Administration: 6 mg (03/28/2019) pegfilgrastim (NEULASTA ONPRO KIT) injection 6 mg, 6 mg, Subcutaneous, Once, 4 of 8 cycles Administration: 6 mg (01/09/2019), 6 mg (01/23/2019), 6 mg (02/06/2019), 6 mg (02/20/2019), 6 mg (04/03/2019), 6 mg (04/17/2019) eriBULin mesylate (HALAVEN) 2.75 mg in sodium chloride 0.9 % 100 mL chemo infusion, 1.2 mg/m2 = 2.75 mg (100 % of original dose 1.2 mg/m2), Intravenous,  Once, 5 of 9 cycles Dose modification: 1.2 mg/m2 (original dose 1.2 mg/m2, Cycle 1, Reason: Provider Judgment) Administration: 2.75 mg (12/19/2018), 2.75 mg (01/09/2019), 2.75 mg (01/23/2019), 2.75 mg (02/06/2019), 2.75 mg (02/20/2019), 2.75 mg (03/06/2019), 2.75 mg (03/25/2019), 2.75 mg (04/03/2019), 2.75 mg (04/17/2019)  for chemotherapy treatment.    01/02/2019 - 01/02/2019 Chemotherapy   The patient had atezolizumab (TECENTRIQ) 1,200 mg in sodium chloride 0.9 % 250 mL chemo infusion, 1,200 mg, Intravenous, Once, 0 of 6 cycles  for chemotherapy treatment.    Metastatic breast cancer ([place])  11/05/2016 Initial Diagnosis   Recurrent cancer of right breast ([place])   07/15/2018 - 01/01/2019 Chemotherapy   The patient had atezolizumab (TECENTRIQ) 840 mg in sodium chloride 0.9 % 250 mL chemo infusion, 840 mg, Intravenous, Once, 5 of 8 cycles Dose modification: 1,680 mg (original dose 840 mg, Cycle 4, Reason: Provider Judgment), 840 mg (original dose 840 mg, Cycle 4, Reason: Provider Judgment) Administration: 840 mg (07/15/2018), 840 mg (08/01/2018), 840 mg (08/15/2018), 840 mg (08/29/2018), 840 mg (09/12/2018), 840 mg (09/26/2018),  840 mg (10/10/2018), 840 mg (10/24/2018), 1,680 mg (11/07/2018)  for chemotherapy treatment.    12/19/2018 -  Chemotherapy   The patient had pegfilgrastim (NEULASTA) injection 6 mg, 6 mg, Subcutaneous, Once, 1 of 1 cycle Administration: 6 mg (03/28/2019) pegfilgrastim (NEULASTA ONPRO KIT)  injection 6 mg, 6 mg, Subcutaneous, Once, 4 of 8 cycles Administration: 6 mg (01/09/2019), 6 mg (01/23/2019), 6 mg (02/06/2019), 6 mg (02/20/2019), 6 mg (04/03/2019), 6 mg (04/17/2019) eriBULin mesylate (HALAVEN) 2.75 mg in sodium chloride 0.9 % 100 mL chemo infusion, 1.2 mg/m2 = 2.75 mg (100 % of original dose 1.2 mg/m2), Intravenous,  Once, 5 of 9 cycles Dose modification: 1.2 mg/m2 (original dose 1.2 mg/m2, Cycle 1, Reason: Provider Judgment) Administration: 2.75 mg (12/19/2018), 2.75 mg (01/09/2019), 2.75 mg (01/23/2019), 2.75 mg (02/06/2019), 2.75 mg (02/20/2019), 2.75 mg (03/06/2019), 2.75 mg (03/25/2019), 2.75 mg (04/03/2019), 2.75 mg (04/17/2019)  for chemotherapy treatment.      PHYSICAL EXAMINATION: ECOG PERFORMANCE STATUS: 4 - Bedbound  Vitals:   06/23/19 1257 06/23/19 1303  BP:    Pulse: 86 96  Resp: 19 18  Temp:    SpO2: 97% 95%   Filed Weights   06/20/19 0630 06/22/19 0300 06/23/19 0430  Weight: 292 lb (132.5 kg) (!) 305 lb (138.3 kg) (!) 302 lb (137 kg)    Intake/Output from previous day: 12/28 0701 - 12/29 0700 In: -  Out: 925 [Urine:925]  GENERAL: Awakens to voice, quickly falls back to sleep, no distress LUNGS: Clear to auscultation bilaterally HEART: Irregular rhythm ABDOMEN:abdomen soft, non-tender and normal bowel sounds NEURO: Opens eyes to voice, able to answer 1 or 2 questions  LABORATORY DATA:  I have reviewed the data as listed CMP Latest Ref Rng & Units 06/23/2019 06/22/2019 06/21/2019  Glucose 70 - 99 mg/dL 108(H) 121(H) 132(H)  BUN 8 - 23 mg/dL _0 Creatinine 0.44 - 1.00 mg/dL 0.97 1.03(H) 1.00  Sodium 135 - 145 mmol/L 144 146(H) 148(H)  Potassium 3.5 - 5.1 mmol/L 3.5 3.7 4.3  Chloride 98 - 111 mmol/L 112(H) 114(H) 117(H)  CO2 22 - 32 mmol/L _1 Calcium 8.9 - 10.3 mg/dL 7.8(L) 8.2(L) 8.3(L)  Total Protein 6.5 - 8.1 g/dL - - -  Total Bilirubin 0.3 - 1.2 mg/dL - - -  Alkaline Phos 38 - 126 U/L - - -  AST 15 - 41 U/L - - -  ALT 0 - 44 U/L -  - -    Lab Results  Component Value Date   WBC 16.2 (H) 06/22/2019   HGB 8.5 (L) 06/22/2019   HCT 28.5 (L) 06/22/2019   MCV 107.1 (H) 06/22/2019   PLT 247 06/22/2019   NEUTROABS 1.8 06/01/2019    EEG  Result Date: 06/21/2019 Kayla Doom, MD     06/21/2019 12:38 PM Patient Name: Kayla Price MRN: [id number] EEG Attending: Roland Rack Referring Physician/Provider: Roland Rack Date: 06/21/2019 Duration: 24 minutes Patient history: 63 yo F with multiple intracranial metastasis with encephalopathy. Recent EEG with triphasic waves. Level of alertness: Encephalopathic AEDs during EEG study: Keppra 537m BID Technical aspects: This EEG study was done with scalp electrodes positioned according to the 10-20 International system of electrode placement. Electrical activity was acquired at a sampling rate of _2  and reviewed with a high frequency filter of _3  and a low frequency filter of _4 . EEG data were recorded continuously and digitally stored. BACKGROUND ACTIVITY:  The background consist predominately of generalized irregular slow activity with some frontocentral  predominant beta range activity.  There are poorly formed sleep structures at times.  No definite posterior dominant rhythm was seen, however following stimulation there was briefly a posteriorly predominant rhythm of 8 Hz but this was very briefly sustained. EPILEPTIFORM ACTIVITY: Interictal epileptiform activity: None Ictal Activity: None OTHER EVENTS: None SLEEP RECORDINGS: Only awake and drowsy states were recorded. Drowsiness was characterized by attenuation of the posterior background rhythm. ACTIVATION PROCEDURES: Hyperventilation and photic stimulation were not performed. IMPRESSION: This EEG is consistent with a generalized nonspecific cerebral dysfunction.  There has been significant improvement since the previous recording and that no triphasic waves are seen in the study. There was no seizure or  evidence of seizure predisposition recorded on this study. Please note that lack of epileptiform activity on EEG does not preclude the possibility of epilepsy.  Roland Rack, MD [facility] [phone] If 7pm- 7am, please page neurology on call as listed in [place].   CT ANGIO CHEST PE W OR WO CONTRAST  Result Date: 06/09/2019 CLINICAL DATA:  Shortness of breath.  Metastatic breast cancer. EXAM: CT ANGIOGRAPHY CHEST WITH CONTRAST TECHNIQUE: Multidetector CT imaging of the chest was performed using the standard protocol during bolus administration of intravenous contrast. Multiplanar CT image reconstructions and MIPs were obtained to evaluate the vascular anatomy. CONTRAST:  45m OMNIPAQUE IOHEXOL 350 MG/ML SOLN COMPARISON:  PET CT 03/11/2019.  Chest CT 04/22/2018 FINDINGS: Cardiovascular: No filling defects in the pulmonary arteries to suggest pulmonary emboli. Tortuous, ectatic aorta with maximum diameter 3.7 cm. No dissection. Heart is enlarged. Mediastinum/Nodes: Extensive adenopathy in the mediastinum. Surgical clips in the right axilla. This is unchanged since recent PET CT. Lungs/Pleura: Extensive pleural nodularity/disease throughout right hemithorax, similar to recent PET CT. Right middle lobe mass is unchanged. Central necrotic portion again noted, unchanged. Scattered ground-glass airspace disease noted within both lungs concerning for pneumonia. Upper Abdomen: Gastrohepatic ligament lymph nodes again noted, unchanged. No acute findings. Musculoskeletal: Prior right mastectomy. Right chest wall Port-A-Cath remains in place, unchanged. Sclerotic lesion within the T11 vertebral body is stable. Review of the MIP images confirms the above findings. IMPRESSION: No evidence of pulmonary embolus. Extensive mediastinal adenopathy, right pleural metastatic disease, and right middle lobe mass are all stable since prior PET CT. Worsening ground-glass airspace disease throughout both lungs  concerning for pneumonia. Cardiomegaly. Tortuous, ectatic thoracic aorta. Electronically Signed   By: KRolm BaptiseM.D.   On: 06/09/2019 17:44   MR BRAIN W WO CONTRAST  Result Date: 06/18/2019 CLINICAL DATA:  Encephalopathy. Metastatic breast cancer. Known brain metastases. EXAM: MRI HEAD WITHOUT AND WITH CONTRAST TECHNIQUE: Multiplanar, multiecho pulse sequences of the brain and surrounding structures were obtained without and with intravenous contrast. CONTRAST:  941mGADAVIST GADOBUTROL 1 MMOL/ML IV SOLN COMPARISON:  MR head without and with contrast 04/28/2019 FINDINGS: Brain: The right occipital lesion has decreased somewhat. There is mostly peripheral enhancement with a nodular component posteriorly. The lesion now measures 2.4 x 2.6 x 2.1 cm. The lesion previously measured 3.4 x 3.1 x 2.7 cm in the same dimensions. A left occipital lesion previously measured 13 x 11 mm. It now measures 13 x 9 mm. A more lateral lesion in the left occipital lobe has decreased from 9 mm to 6.5 mm. A lesion in the lateral left temporal lobe has decreased from 6.53.5 mm on image 23 of series 10. A medial right temporal lobe lesion on image 25 has decreased from 7.5 mm to 6 mm. A lesion in the left paramedian superior  cerebellar vermis now measures 4 mm. Previously measured 7 mm. A lesion in the left paramedian pons previously measured 4.5 mm. It is now punctate in size. A lesion in the anterior left frontal lobe white matter on image 36 as decreased from 7.5 mm to 3.5 mm. A punctate lesion in the left corona radiata previously measured 4 mm. Punctate lesion is again noted in the posterior left frontal lobe on image 45. No new enhancing lesions are present. Vasogenic edema is most prominent about the occipital lobe lesions. This has decreased since the prior exam. No acute infarct or hemorrhage is present. Ventricles are of normal size. No significant extraaxial fluid collection is present. Vascular: Flow is present in the  major intracranial arteries. Skull and upper cervical spine: Extensive marrow signal changes in the calvarium ir consistent with osseous metastases. Heterogeneous marrow signal is present in the upper cervical spine. Sinuses/Orbits: Minimal fluid is present in the left maxillary sinus. A fluid level is present in the left sphenoid sinus. Left ethmoid air cells are partially opacified. The paranasal sinuses and mastoid air cells are otherwise clear. IMPRESSION: 1. Decrease in size of multiple brain metastases identified on the prior exam. No new lesions are present. 2. Decreased vasogenic edema, still predominantly occipital lobes. 3. Diffuse marrow signal changes in the skull concerning for metastatic disease. 4. Left maxillary and sphenoid sinus disease. Electronically Signed   By: San Morelle M.D.   On: 06/18/2019 18:44   US RENAL  Result Date: 05/28/2019 CLINICAL DATA:  Acute kidney injury. EXAM: RENAL / URINARY TRACT ULTRASOUND COMPLETE COMPARISON:  PET CT scan 03/11/2019. FINDINGS: Right Kidney: Renal measurements: 9.9 x 4.3 x 5.0 cm = volume: 109.7 mL . Echogenicity within normal limits. No mass or hydronephrosis visualized. Left Kidney: Renal measurements: 9.6 x 5.4 x 4.9 cm = volume: Is 134.3. mL. Echogenicity within normal limits. No mass or hydronephrosis visualized. Bladder: Appears normal for degree of bladder distention. Other: Small right pleural effusion and gallstones are noted as seen on the prior exam. IMPRESSION: Negative for hydronephrosis.  Normal appearing kidneys. Right pleural effusion. Gallstones. Electronically Signed   By: Inge Rise M.D.   On: 05/28/2019 07:15   DG CHEST PORT 1 VIEW  Result Date: 06/19/2019 CLINICAL DATA:  Unresponsive and agitated. History metastatic breast carcinoma with known extensive right-sided pleural and parenchymal metastatic disease. EXAM: PORTABLE CHEST 1 VIEW COMPARISON:  06/18/2019 FINDINGS: Stable appearance of Port-A-Cath. Stable  volume loss and poor aeration of the right lung secondary to extensive pleural tumor and associated loculated right pleural fluid. Lower volume of the left lung with persistent dense infiltrate in the left mid lung and probable component of infiltrate at the left lung base. No pneumothorax. No significant left-sided pleural fluid identified. IMPRESSION: Stable extensive pleural and parenchymal disease in the right chest with associated loculated right pleural fluid. Stable dense infiltrate in the left mid lung and probable component of infiltrate at the left lung base. Electronically Signed   By: Aletta Edouard M.D.   On: 06/19/2019 14:37   DG CHEST PORT 1 VIEW  Result Date: 06/18/2019 CLINICAL DATA:  Hypoxia EXAM: PORTABLE CHEST 1 VIEW COMPARISON:  06/13/2019 FINDINGS: Right Port-A-Cath remains in place, unchanged. Cardiomegaly. Diffuse right lung and patchy left lung airspace disease again noted, not significantly changed. Possible right effusion. No acute bony abnormality. IMPRESSION: No significant change since prior study. Electronically Signed   By: Rolm Baptise M.D.   On: 06/18/2019 09:17   DG CHEST PORT  1 VIEW  Result Date: 06/13/2019 CLINICAL DATA:  Hypoxia. EXAM: PORTABLE CHEST 1 VIEW COMPARISON:  June 12, 2019 FINDINGS: The right Port-A-Cath is stable. No pneumothorax. Infiltrate in the left mid lung is more focal in the interval. Infiltrate in the right lung, particularly in the lower right lung is similar in the interval. The cardiomediastinal silhouette is stable. No other interval changes. Stable cardiomediastinal silhouette. IMPRESSION: 1. Stable infiltrate on the right, particularly in the right base. More focal infiltrate in the left mid lung compared to the previous study. Stable right Port-A-Cath. Electronically Signed   By: Dorise Bullion III M.D   On: 06/13/2019 15:45   DG Chest Port 1 View  Result Date: 06/12/2019 CLINICAL DATA:  Hypoxia. Metastatic breast cancer. EXAM:  PORTABLE CHEST 1 VIEW COMPARISON:  CTA chest 06/09/2019. One-view chest x-ray 06/08/2019. FINDINGS: The heart size is normal. The heart is enlarged. Right IJ Port-A-Cath is stable. A right pleural effusion has slightly increased. Right greater than left airspace opacities have progressed. Surgical clips are noted in the right axilla. IMPRESSION: 1. Progressive right greater than left airspace disease compatible with pneumonia. 2. Slight increase in right pleural effusion. 3. Stable right IJ Port-A-Cath. Electronically Signed   By: San Morelle M.D.   On: 06/12/2019 09:29   DG CHEST PORT 1 VIEW  Result Date: 06/08/2019 CLINICAL DATA:  Hypoxia EXAM: PORTABLE CHEST 1 VIEW COMPARISON:  07/10/2018 FINDINGS: Right Port-A-Cath in place with the tip in the SVC. Airspace disease in the right lower lung concerning for pneumonia. No confluent opacity on the left. Heart is borderline in size. Possible small right effusion. No acute bony abnormality. IMPRESSION: Right lower lung airspace disease concerning for pneumonia. Small right effusion suspected. Electronically Signed   By: Rolm Baptise M.D.   On: 06/08/2019 18:55   EEG adult  Result Date: 06/18/2019 Lora Havens, MD     06/18/2019  8:55 PM Patient Name: Kayla Price MRN: [id number] Epilepsy Attending: Lora Havens Referring Physician/Provider: Dr Debbe Odea Date: 06/18/2019 Duration: 24.56 mins Patient history: 63yo F with HIV, brain mets and now with ams. EEG to evaluate for seizure Level of alertness: awake AEDs during EEG study: None Technical aspects: This EEG study was done with scalp electrodes positioned according to the 10-20 International system of electrode placement. Electrical activity was acquired at a sampling rate of _0  and reviewed with a high frequency filter of _1  and a low frequency filter of _2 . EEG data were recorded continuously and digitally stored. DESCRIPTION: During awake state, no clear posterior dominant  rhythm. EEG also showed continuous generalized  3-_3  theta-delta slowing. Triphasic waves, generalized, maximal bifrontal, at _4  were also noted. Hyperventilation and photic stimulation were not performed. ABNORMALITY - Continuous slow, generalized - Triphasic waves, generalized IMPRESSION: This study is  Suggestive of severe diffuse encephalopathy, non specific to etiology but could be secondary to toxic-metabolic causes, cepefime toxicity. No seizures or definite epileptiform discharges were seen throughout the recording. Lora Havens   ECHOCARDIOGRAM COMPLETE  Result Date: 06/12/2019   ECHOCARDIOGRAM REPORT   Patient Name:   Kayla Price Date of Exam: 06/12/2019 Medical Rec #:  [id number]         Height:       67.0 in Accession #:    [id number]        Weight:       198.0 lb Date of Birth:  10-15-1955         BSA:  2.01 m Patient Age:    62 years          BP:           128/76 mmHg Patient Gender: F                 HR:           108 bpm. Exam Location:  Inpatient Procedure: 2D Echo, Color Doppler and Cardiac Doppler Indications:    R06.9 DOE  History:        Patient has no prior history of Echocardiogram examinations.                 Risk Factors:Hypertension and Dyslipidemia.  Sonographer:    Raquel Sarna Senior RDCS Referring Phys: [place]  1. Left ventricular ejection fraction, by visual estimation, is 65 to 70%. The left ventricle has normal function. There is no left ventricular hypertrophy.  2. The left ventricle has no regional wall motion abnormalities.  3. Global right ventricle has normal systolic function.The right ventricular size is mildly enlarged. No increase in right ventricular wall thickness.  4. Left atrial size was normal.  5. Right atrial size was severely dilated.  6. The mitral valve is normal in structure. Mild mitral valve regurgitation. No evidence of mitral stenosis.  7. The tricuspid valve is normal in structure. Tricuspid valve regurgitation is  mild.  8. The aortic valve is tricuspid. Aortic valve regurgitation is not visualized. No evidence of aortic valve sclerosis or stenosis.  9. There is severe calcifcation of the aortic valve. 10. The pulmonic valve was normal in structure. Pulmonic valve regurgitation is trivial. 11. Severely elevated pulmonary artery systolic pressure at 75ZWCH. 12. The inferior vena cava is normal in size with greater than 50% respiratory variability, suggesting right atrial pressure of 3 mmHg. 13. The interatrial septum appears to be lipomatous. FINDINGS  Left Ventricle: Left ventricular ejection fraction, by visual estimation, is 65 to 70%. The left ventricle has normal function. The left ventricle has no regional wall motion abnormalities. There is no left ventricular hypertrophy. Normal left atrial pressure. Right Ventricle: The right ventricular size is mildly enlarged. No increase in right ventricular wall thickness. Global RV systolic function is has normal systolic function. The tricuspid regurgitant velocity is 4.00 m/s, and with an assumed right atrial  pressure of 8 mmHg, the estimated right ventricular systolic pressure is severely elevated at 72.0 mmHg. Left Atrium: Left atrial size was normal in size. Right Atrium: Right atrial size was severely dilated Pericardium: There is no evidence of pericardial effusion. Mitral Valve: The mitral valve is normal in structure. Mild mitral valve regurgitation. No evidence of mitral valve stenosis by observation. Tricuspid Valve: The tricuspid valve is normal in structure. Tricuspid valve regurgitation is mild. Aortic Valve: The aortic valve is tricuspid. . There is moderate thickening and severe calcifcation of the aortic valve. Aortic valve regurgitation is not visualized. The aortic valve is structurally normal, with no evidence of sclerosis or stenosis. There is moderate thickening of the aortic valve. There is severe calcifcation of the aortic valve. Pulmonic Valve: The  pulmonic valve was normal in structure. Pulmonic valve regurgitation is trivial. Pulmonic regurgitation is trivial. Aorta: The aortic root, ascending aorta and aortic arch are all structurally normal, with no evidence of dilitation or obstruction. Venous: The inferior vena cava is normal in size with greater than 50% respiratory variability, suggesting right atrial pressure of 3 mmHg. IAS/Shunts: Increased thickness of the atrial septum sparing  the fossa ovalis consistent with The interatrial septum appears to be lipomatous. No atrial level shunt detected by color flow Doppler. There is no evidence of a patent foramen ovale. No ventricular septal defect is seen or detected. There is no evidence of an atrial septal defect.  LEFT VENTRICLE PLAX 2D LVIDd:         3.75 cm LVIDs:         1.82 cm LV PW:         1.05 cm LV IVS:        0.94 cm LVOT diam:     2.10 cm LV SV:         50 ml LV SV Index:   23.95 LVOT Area:     3.46 cm  RIGHT VENTRICLE             IVC RV Basal diam:  3.23 cm     IVC diam: 1.44 cm RV Mid diam:    4.81 cm RV Length:      6.81 cm RV S prime:     21.10 cm/s TAPSE (M-mode): 1.8 cm LEFT ATRIUM           Index LA diam:      3.10 cm 1.54 cm/m LA Vol (A2C): 34.1 ml 16.94 ml/m LA Vol (A4C): 46.1 ml 22.90 ml/m  AORTIC VALVE LVOT Vmax:   98.80 cm/s LVOT Vmean:  74.000 cm/s LVOT VTI:    0.192 m  AORTA Ao Root diam: 3.10 cm Ao Asc diam:  3.60 cm TRICUSPID VALVE TR Peak grad:   64.0 mmHg TR Vmax:        400.00 cm/s  SHUNTS Systemic VTI:  0.19 m Systemic Diam: 2.10 cm  Fransico Him MD Electronically signed by Fransico Him MD Signature Date/Time: 06/12/2019/3:15:41 PM    Final    VAS [place] LOWER EXTREMITY VENOUS (DVT)  Result Date: 06/09/2019  Lower Venous Study Indications: Swelling.  Risk Factors: Cancer History of metastatic breast cancer. Comparison Study: No prior study on file Performing Technologist: Sharion Dove RVS  Examination Guidelines: A complete evaluation includes B-mode imaging, spectral  Doppler, color Doppler, and power Doppler as needed of all accessible portions of each vessel. Bilateral testing is considered an integral part of a complete examination. Limited examinations for reoccurring indications may be performed as noted.  +---------+---------------+---------+-----------+----------+--------------+ RIGHT    CompressibilityPhasicitySpontaneityPropertiesThrombus Aging +---------+---------------+---------+-----------+----------+--------------+ CFV      Partial        Yes      Yes                  Acute          +---------+---------------+---------+-----------+----------+--------------+ SFJ      Full                                                        +---------+---------------+---------+-----------+----------+--------------+ FV Prox  None                                         Acute          +---------+---------------+---------+-----------+----------+--------------+ FV Mid   None  Acute          +---------+---------------+---------+-----------+----------+--------------+ FV DistalNone                                         Acute          +---------+---------------+---------+-----------+----------+--------------+ PFV      Full                                                        +---------+---------------+---------+-----------+----------+--------------+ POP      None           No       No                   Acute          +---------+---------------+---------+-----------+----------+--------------+ PTV      None                                         Acute          +---------+---------------+---------+-----------+----------+--------------+ PERO     None                                         Acute          +---------+---------------+---------+-----------+----------+--------------+ EIV                     Yes      Yes                                  +---------+---------------+---------+-----------+----------+--------------+   +----+---------------+---------+-----------+----------+--------------+ LEFTCompressibilityPhasicitySpontaneityPropertiesThrombus Aging +----+---------------+---------+-----------+----------+--------------+ CFV Full           Yes      Yes                                 +----+---------------+---------+-----------+----------+--------------+     Summary: Right: Findings consistent with acute deep vein thrombosis involving the right common femoral vein, right femoral vein, right popliteal vein, right posterior tibial veins, and right peroneal veins. Left: No evidence of common femoral vein obstruction.  *See table(s) above for measurements and observations. Electronically signed by Harold Barban MD on 06/09/2019 at 8:27:06 AM.    Final     ASSESSMENT: 63 y.o. [place] woman    (1) status post right lumpectomy and sentinel lymph node sampling October 2005 for a 0.6 cm invasive ductal carcinoma involving one out of 2 sentinel lymph nodes sampled, grade 3, triple-negative, treated adjuvantly with doxorubicin and cyclophosphamide 4 followed by weekly paclitaxel 7, followed by adjuvant radiation   RECURRENT DISEASE: (2) status post right breast upper outer quadrant biopsy and right axillary lymph node biopsy 10/01/2016, both positive for a T2 N1, stage IIIB invasive ductal carcinoma, triple negative, with an MIB-1 of 50-70%   (3) status post right modified radical mastectomy 11/05/2016 showing a pT2 pN1, stage IIIB invasive ductal carcinoma, grade 3, triple negative,  with negative margins   (4) not a candidate for radiation given prior history   (5) adjuvant chemotherapy consisting of carboplatin and gemcitabine given days 1 and 8 of each 21 day cycle, for 6 cycles, starting 11/20/2016, completed 03/11/2017             (a) day 8 cycle 2 omitted because of neutropenia; Neupogen/Neulasta added   (5) HIV positivity:  under care of Id Lucianne [place])   (6) Genetic testing 06/17/2017:  no pathogenic mutations. Genes tested: APC, ATM, AXIN2, BARD1, BLM, BMPR1A, BRCA1, BRCA2, BRIP1, CDH1, CDK4, CDKN2A (p14ARF), CDKN2A (p16INK4a), CEBPA, CHEK2, CTNNA1, DICER1, EPCAM*, GATA2, GREM1*, HRAS, KIT, MEN1, MLH1, MSH2, MSH3, MSH6, MUTYH, NBN, NF1, PALB2, PDGFRA, PMS2, POLD1, POLE, PTEN, RAD50, RAD51C, RAD51D, RUNX1, SDHB, SDHC, SDHD, SMAD4, SMARCA4, STK11, TERC, TERT, TP53, TSC1, TSC2, VHL. The following genes were evaluated for sequence changes only: HOXB13*, NTHL1*, SDHA.              (a) A variant of uncertain significance (VUS) in a gene called NTHL1 was also noted. c.[id number] (p.[id number])   METASTATIC DISEASE: November 2019 (1) Patient seen in urgent care and ultimately ED on 04/14/2018 for shortness of breath, chest xray demonstrated Right pleural effusion.   (a) right thoracenteses on 10/21 and 11/4 results show atypical cells, non diagnostic (b) CT chest 04/22/2018 shows re-accumulation of fluid, and right pleural nodularity. (c) PET scan on 05/01/2018 shows hypermetabolic pleural based metastases, right CP angle nodal metastases, no evidence of malignancy in abdomen and pelvis. (d) bronchoscopy with biopsy by Dr. Lamonte Sakai and BAL on 05/15/2018 was non diagnostic             (e) VAT biopsy of the right pleura 05/30/2018 confirms carcinoma, triple negative             (f) [facility] shows PD-L1 positive (1% in [facility]); otherwise microsatellite stable, TMB low (5/Mb), no PIK3 mutations; other mutations suggest sensitivity to MTOR inhibitors and several TKIs   (2) Atezolizumab started 07/15/2018 given every other week             (a) PET 08/13/2018 shows tumor Right hemithorax (new baseline study)             (b) Atezo changed to Q4w starting with 11/07/2018 dose             (c) PET 11/28/2018 documents progression in the right lung and pleural area as well as lymph nodes, and a T1 skeletal metastasis             (d)  atezolizumab discontinued after 12/05/2018 dose   (3) eribulin day 1 and 8 of every 21 day cycle started 12/19/2018             (a) changed to every other week with Neulsta support due to neutropenia causing treatment delays and her h/o HIV (delays due to insurance denial of onpro after 02/20/2019 dose)             (b) PET scan on 03/11/2019 showed stable to improved measurable disease, with multiple new bone lesions             (c) zoledronate given every 12 weeks starting on 03/25/2019   (4) brain MRI 04/28/2019 documents multiple intracranial metastases             (a) whole brain radiation 04/30/2019 - 05/13/2019, 30 Gy in 10 fractions  (5) right lower extremity DVT diagnosed December 2020     PLAN:  Kayla Price  has had continued decline.  She has developed acute toxic encephalopathy suspected to be related to cefepime.  This has been discontinued with only minimal improvement.  She is not really eating or drinking.  She was on BiPAP earlier today which was switched to nasal cannula to prevent CO2 retention.   Kayla Price has widely disseminated breast cancer.  As previously noted, due to her continued decline in performance status and poor respiratory status, it is not clear if she will tolerate any additional systemic therapy.  If she continues to decline, would recommend consideration of residential hospice.    We will continue to follow with you.     LOS: 20 days   Mikey Bussing, DNP, AGPCNP-BC, AOCNP 06/23/19

## 2019-06-23 NOTE — Progress Notes (Signed)
PROGRESS NOTE    Kayla Price   WEX:[id number]  DOB: 1955/11/23  DOA: 06/01/2019 PCP: Lauree Chandler, NP   Brief Narrative:  Kayla Price 63 year old female with PMH of metastatic breast cancer with metastasis to brain and right pleura, s/p whole brain radiation, HIV, CKD stage II, recently hospitalized 05/27/2019-05/31/2023 acute kidney injury, hypotension and superficial ulcerations of the groin/perineum who presented the next day of discharge 06/01/2019 due to worsening drainage from her known skin ulcerations along with generalized weakness.  She was admitted for hypotension, acute kidney injury and ulcers of her groin and perineum.  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  Hospital course complicated by community-acquired pneumonia, extensive right lower extremity DVT, acute hypoxic respiratory failure.  She was transferred to progressive care unit on 12/17, PCCM consulted, BiPAP initiated, primary oncologist and PMT consulted, since she was doing so poorly, discussions regarding transitioning to comfort care were ongoing.   She was able to be weaned off of BiPAP to room air (as low at 2-4 L; at rest) however she developed acute onset of confusion on 12/24. This was determined to be Cefepime toxicity. She was treated with PRN Ativan and Morphine. She developed hypercarbic respiratory failure and required a BiPAP, this time to correct her hypercarbia.   Subjective: On BiPAP this AM. Alert and attentive when spoken with. No complaints.   Assessment & Plan:   Principal Problem:  Acute Toxic encephalopathy suspected to be Cefepime toxicity- 12/24  -repeat chest x-ray does not show progression of infiltrates and oxygen level is in the 90s and thus I feel that her pneumonia is improving. -  UA was not consistent with a UTI -  MRI shows multiple mets and edema which are improving - Neuro consulted for possible LP- antibiotics and  antivirals started to cover for meningitis- also started on antiepileptics - EEG on 12/24> severe diffuse encephalopathy, non specific to etiology but could be secondary to toxic-metabolic causes, cepefime toxicity.  - Neuro feels that she may have Cefepime toxicity-    - repeat EEG 12/27>generalized nonspecific cerebral dysfunction.  There has been significant improvement since the previous recording and that no triphasic waves are seen in the study.    - today she is much less restless and much more awake  Active Problems:  Acute hypercarbic respiratory failure - this occurred 12.27 pH was 7.227 and pCo2 was 64- a BiPAP was started however the patient became agitated with it overnight and the BiPAP was removed and she was placed on high flow O2 and given Ativan - 12/28>  her hypercarbia has progressed- pH is now 7.157 and pCo2 is 80.7- have resumed BiPAP and updated her daughter in regards to her declining condition- her daughter understands that the patient had chosen to be a DNR when she was more alert and she would like to honor the DNR - 12/29> alert on BiPAP- have asked respiratory therapist to remove BiPAP and for Oxygen to be kept < 95% to prevent CO2 retention   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 -This was the reason for her admission -She has completed a course of Valtrex - foley was being used to allow her skin to heal- will remove today and request purewick    AKI (acute kidney injury)  -CKD 2/3  -Renal ultrasound 12/3 was unrevealing -This has resolved-  - Cr now ~ 0.9- 1.0 range  Hypernatremia  - she has not been eating/drinking  thus needs maintenance fluids- cont D5W- sodium has improved to 144 today  Elevated TSH - TSH was 19.709 and when rechecked was 23.171 - Free T 4 waas 0.70- cont Synthroid IV until able to take orals  Hypoxic respiratory failure secondary to community-acquired pneumonia -Patient required treatment with BiPAP starting on 12/18 and was   weaned down to nasal cannula (before BiPAP resumed for Hypercarbic resp failure) - She was being treated with IV antibiotics including Vanc and cefepime   -  MRSA PCR screen was negative and therefore vancomycin was discontinued on 12/23   - Due to suspected Cefepime toxicity, have d/c'd Cefepime as well- she has received 7 days of Vanc/Cefepime - she still has a RLL infiltrate which is not progressing-we have been able to wean down O2 - WBC count still ~ 16-   HIV -Relatively well controlled with a CD4 count greater than 400 on 10/26 -She will need to follow-up with ID as outpatient in 4 to 6 weeks - I spoke with Dr Drucilla Schmidt on 12/24 who recommended that I stop Selzentry - due to lethargy (form Cefepime toxicity), she has not been able to take her medications  Extensive left leg DVT- acute -Has been placed on Lovenox - due to interactions with HIV meds, she will need to be transitioned to a NOAC when alert enough to swallow  Metastatic Breast cancer-mets to the brain and right pleural space - is status post post whole brain radiation 11/20, chemotherapy and right modified radical mastectomy -There is a plan for repeat PET scan as outpatient-she is followed by Dr. Jana Hakim who has been seeing her in the hospital as well  Time spent in minutes: 35 DVT prophylaxis: Lovenox Code Status: DO NOT RESUSCITATE Family Communication: daughter Photographer daily Disposition Plan: To be determined - need to see if she can remain off of BiPAP and also begin to eat Consultants:   Oncology  Critical care  Infectious disease  Palliative care  Neurology Procedures:   BiPAP  EEGs  Foley cath Antimicrobials:  Anti-infectives (From admission, onward)   Start     Dose/Rate Route Frequency Ordered Stop   06/19/19 1000  acyclovir (ZOVIRAX) 800 mg in dextrose 5 % 150 mL IVPB  Status:  Discontinued     800 mg 166 mL/hr over 60 Minutes Intravenous Every 8 hours 06/19/19 0627 06/19/19 1345    06/19/19 0000  ampicillin (OMNIPEN) 1 g in sodium chloride 0.9 % 100 mL IVPB  Status:  Discontinued     1 g 300 mL/hr over 20 Minutes Intravenous Every 6 hours 06/18/19 2117 06/18/19 2155   06/18/19 2300  ampicillin (OMNIPEN) 2 g in sodium chloride 0.9 % 100 mL IVPB  Status:  Discontinued     2 g 300 mL/hr over 20 Minutes Intravenous Every 4 hours 06/18/19 2155 06/19/19 1345   06/18/19 2230  vancomycin (VANCOCIN) IVPB 1000 mg/200 mL premix  Status:  Discontinued     1,000 mg 200 mL/hr over 60 Minutes Intravenous Every 12 hours 06/18/19 2147 06/19/19 1345   06/18/19 2230  cefTRIAXone (ROCEPHIN) 2 g in sodium chloride 0.9 % 100 mL IVPB  Status:  Discontinued     2 g 200 mL/hr over 30 Minutes Intravenous Every 12 hours 06/18/19 2147 06/19/19 1351   06/18/19 2200  acyclovir (ZOVIRAX) 800 mg in dextrose 5 % 150 mL IVPB  Status:  Discontinued     800 mg 166 mL/hr over 60 Minutes Intravenous Every 8 hours 06/18/19 2119 06/19/19  7654   06/18/19 2200  cefTRIAXone (ROCEPHIN) 1 g in sodium chloride 0.9 % 100 mL IVPB  Status:  Discontinued     1 g 200 mL/hr over 30 Minutes Intravenous Every 24 hours 06/18/19 2125 06/18/19 2147   06/18/19 2130  cefTRIAXone (ROCEPHIN) 1 g in sodium chloride 0.9 % 100 mL IVPB  Status:  Discontinued     1 g 200 mL/hr over 30 Minutes Intravenous Every 24 hours 06/18/19 2117 06/18/19 2125   06/15/19 1000  vancomycin (VANCOREADY) IVPB 1500 mg/300 mL  Status:  Discontinued     1,500 mg 150 mL/hr over 120 Minutes Intravenous Every 24 hours 06/14/19 1017 06/17/19 1139   06/14/19 1800  vancomycin (VANCOREADY) IVPB 1500 mg/300 mL  Status:  Discontinued     1,500 mg 150 mL/hr over 120 Minutes Intravenous Every 24 hours 06/14/19 0924 06/14/19 1017   06/14/19 1400  ceFEPIme (MAXIPIME) 2 g in sodium chloride 0.9 % 100 mL IVPB  Status:  Discontinued     2 g 200 mL/hr over 30 Minutes Intravenous Every 8 hours 06/14/19 0924 06/18/19 2117   06/13/19 1800  vancomycin (VANCOREADY) IVPB  1250 mg/250 mL  Status:  Discontinued     1,250 mg 166.7 mL/hr over 90 Minutes Intravenous Every 24 hours 06/12/19 1506 06/14/19 0924   06/13/19 1130  ceFEPIme (MAXIPIME) 2 g in sodium chloride 0.9 % 100 mL IVPB  Status:  Discontinued     2 g 200 mL/hr over 30 Minutes Intravenous Every 12 hours 06/13/19 1159 06/14/19 0924   06/12/19 1800  vancomycin (VANCOREADY) IVPB 1750 mg/350 mL     1,750 mg 175 mL/hr over 120 Minutes Intravenous  Once 06/12/19 1506 06/12/19 1940   06/12/19 1600  ceFEPIme (MAXIPIME) 2 g in sodium chloride 0.9 % 100 mL IVPB  Status:  Discontinued     2 g 200 mL/hr over 30 Minutes Intravenous Every 12 hours 06/12/19 1506 06/13/19 1159   06/10/19 0600  cefTRIAXone (ROCEPHIN) 1 g in sodium chloride 0.9 % 100 mL IVPB  Status:  Discontinued     1 g 200 mL/hr over 30 Minutes Intravenous Every 24 hours 06/09/19 0732 06/12/19 1335   06/09/19 0800  doxycycline (VIBRAMYCIN) 100 mg in sodium chloride 0.9 % 250 mL IVPB  Status:  Discontinued     100 mg 125 mL/hr over 120 Minutes Intravenous Every 12 hours 06/09/19 0735 06/12/19 1335   06/09/19 0745  cefTRIAXone (ROCEPHIN) 1 g in sodium chloride 0.9 % 100 mL IVPB     1 g 200 mL/hr over 30 Minutes Intravenous  Once 06/09/19 0738 06/09/19 0924   06/05/19 2200  valACYclovir (VALTREX) tablet 1,000 mg     1,000 mg Oral 2 times daily 06/05/19 1418 06/12/19 1101   06/05/19 1000  valACYclovir (VALTREX) tablet 1,000 mg  Status:  Discontinued     1,000 mg Oral 3 times daily 06/05/19 0834 06/05/19 1418   06/02/19 2200  darunavir-cobicistat (PREZCOBIX) 800-150 MG per tablet 1 tablet     1 tablet Oral Daily at bedtime 06/02/19 0950     06/02/19 2200  dolutegravir (TIVICAY) tablet 50 mg     50 mg Oral Daily at bedtime 06/02/19 1455     06/02/19 2200  rilpivirine (EDURANT) tablet 25 mg     25 mg Oral Daily at bedtime 06/02/19 1455     06/02/19 1700  dolutegravir (TIVICAY) tablet 50 mg  Status:  Discontinued     50 mg Oral Daily with supper  06/02/19 1453 06/02/19 1456   06/02/19 1700  rilpivirine (EDURANT) tablet 25 mg  Status:  Discontinued     25 mg Oral Daily with supper 06/02/19 1453 06/02/19 1456   06/02/19 1415  fluconazole (DIFLUCAN) IVPB 200 mg  Status:  Discontinued     200 mg 100 mL/hr over 60 Minutes Intravenous Every 24 hours 06/02/19 1402 06/05/19 0834   06/02/19 1100  maraviroc (SELZENTRY) tablet 150 mg  Status:  Discontinued     150 mg Oral 2 times daily 06/02/19 0950 06/18/19 1443   06/02/19 1000  cefdinir (OMNICEF) capsule 300 mg     300 mg Oral 2 times daily 06/02/19 0950 06/04/19 2207   06/02/19 1000  fluconazole (DIFLUCAN) tablet 400 mg  Status:  Discontinued     400 mg Oral Daily 06/02/19 0950 06/02/19 1402       Objective: Vitals:   06/23/19 0332 06/23/19 0402 06/23/19 0430 06/23/19 0836  BP:   117/81   Pulse: (!) 105 90 86 85  Resp: (!) 24 (!) 27 (!) 23 20  Temp:   98.1 F (36.7 C)   TempSrc:   Axillary   SpO2: 98% 98% 100% 100%  Weight:   (!) 137 kg   Height:        Intake/Output Summary (Last 24 hours) at 06/23/2019 1153 Last data filed at 06/23/2019 0432 Gross per 24 hour  Intake --  Output 475 ml  Net -475 ml   Filed Weights   06/20/19 0630 06/22/19 0300 06/23/19 0430  Weight: 132.5 kg (!) 138.3 kg (!) 137 kg    Examination: General exam: Appears comfortable - Alert on BiPAP- not restless and moaning today as she has been in the past few days HEENT: PERRLA  Respiratory system: Clear to auscultation. Respiratory effort normal. Cardiovascular system: S1 & S2 heard,  No murmurs  Gastrointestinal system: Abdomen soft, non-tender, nondistended. Normal bowel sounds   Central nervous system: Alert  -No focal neurological deficits. Extremities: No cyanosis, clubbing or edema Skin: No rashes or ulcers Psychiatry:  difficult to assess   Data Reviewed: I have personally reviewed following labs and imaging studies  CBC: Recent Labs  Lab 06/18/19 1030 06/19/19 1635 06/20/19 0610  06/21/19 0645 06/22/19 0429  WBC 14.3* 14.1* 18.2* 18.5* 16.2*  HGB 8.3* 8.0* 8.7* 8.2* 8.5*  HCT 26.8* 25.6* 29.0* 28.1* 28.5*  MCV 100.4* 102.8* 103.2* 108.5* 107.1*  PLT 234 230 260 254 932   Basic Metabolic Panel: Recent Labs  Lab 06/19/19 0358 06/20/19 0610 06/21/19 0645 06/22/19 0429 06/23/19 0522  NA 142 145 148* 146* 144  K 3.4* 3.6 4.3 3.7 3.5  CL 107 112* 117* 114* 112*  CO2 28 26 27 26 25   GLUCOSE 107* 146* 132* 121* 108*  BUN 19 17 18 16 12   CREATININE 0.95 0.98 1.00 1.03* 0.97  CALCIUM 8.8* 8.6* 8.3* 8.2* 7.8*   GFR: Estimated Creatinine Clearance: 86 mL/min (by C-G formula based on SCr of 0.97 mg/dL). Liver Function Tests: No results for input(s): AST, ALT, ALKPHOS, BILITOT, PROT, ALBUMIN in the last 168 hours. No results for input(s): LIPASE, AMYLASE in the last 168 hours. Recent Labs  Lab 06/18/19 1721  AMMONIA 19   Coagulation Profile: No results for input(s): INR, PROTIME in the last 168 hours. Cardiac Enzymes: No results for input(s): CKTOTAL, CKMB, CKMBINDEX, TROPONINI in the last 168 hours. BNP (last 3 results) No results for input(s): PROBNP in the last 8760 hours. HbA1C: No results for input(s): HGBA1C  in the last 72 hours. CBG: Recent Labs  Lab 06/17/19 2208 06/19/19 0739  GLUCAP 99 85   Lipid Profile: No results for input(s): CHOL, HDL, LDLCALC, TRIG, CHOLHDL, LDLDIRECT in the last 72 hours. Thyroid Function Tests: No results for input(s): TSH, T4TOTAL, FREET4, T3FREE, THYROIDAB in the last 72 hours. Anemia Panel: No results for input(s): VITAMINB12, FOLATE, FERRITIN, TIBC, IRON, RETICCTPCT in the last 72 hours. Urine analysis:    Component Value Date/Time   COLORURINE AMBER (A) 06/18/2019 1112   APPEARANCEUR CLOUDY (A) 06/18/2019 1112   LABSPEC 1.020 06/18/2019 1112   PHURINE 5.0 06/18/2019 1112   GLUCOSEU NEGATIVE 06/18/2019 1112   GLUCOSEU NEG mg/dL 07/23/2006 2113   HGBUR MODERATE (A) 06/18/2019 1112   BILIRUBINUR  NEGATIVE 06/18/2019 1112   KETONESUR 5 (A) 06/18/2019 1112   PROTEINUR 100 (A) 06/18/2019 1112   UROBILINOGEN 1 07/23/2006 2113   NITRITE NEGATIVE 06/18/2019 1112   LEUKOCYTESUR NEGATIVE 06/18/2019 1112   Sepsis Labs: @LABRCNTIP (procalcitonin:4,lacticidven:4) ) Recent Results (from the past 240 hour(s))  MRSA PCR Screening     Status: None   Collection Time: 06/16/19  5:30 PM   Specimen: Nasal Mucosa; Nasopharyngeal  Result Value Ref Range Status   MRSA by PCR NEGATIVE NEGATIVE Final    Comment:        The GeneXpert MRSA Assay (FDA approved for NASAL specimens only), is one component of a comprehensive MRSA colonization surveillance program. It is not intended to diagnose MRSA infection nor to guide or monitor treatment for MRSA infections. Performed at [facility], [place] [address], [place], [place] [address]   Culture, Urine     Status: None   Collection Time: 06/18/19 11:12 AM   Specimen: Urine, Random  Result Value Ref Range Status   Specimen Description URINE, RANDOM  Final   Special Requests NONE  Final   Culture   Final    NO GROWTH Performed at [facility], [place] [address] [place], [place] [address]    Report Status 06/19/2019 FINAL  Final         Radiology Studies: EEG  Result Date: 06/21/2019 Greta Doom, MD     06/21/2019 12:38 PM Patient Name: Kayla Price MRN: [id number] EEG Attending: Roland Rack Referring Physician/Provider: Roland Rack Date: 06/21/2019 Duration: 24 minutes Patient history: 63 yo F with multiple intracranial metastasis with encephalopathy. Recent EEG with triphasic waves. Level of alertness: Encephalopathic AEDs during EEG study: Keppra 500mg  BID Technical aspects: This EEG study was done with scalp electrodes positioned according to the 10-20 International system of electrode placement. Electrical activity was acquired at a sampling rate of 500Hz  and reviewed with a high frequency filter of  70Hz  and a low frequency filter of 1Hz . EEG data were recorded continuously and digitally stored. BACKGROUND ACTIVITY:  The background consist predominately of generalized irregular slow activity with some frontocentral predominant beta range activity.  There are poorly formed sleep structures at times.  No definite posterior dominant rhythm was seen, however following stimulation there was briefly a posteriorly predominant rhythm of 8 Hz but this was very briefly sustained. EPILEPTIFORM ACTIVITY: Interictal epileptiform activity: None Ictal Activity: None OTHER EVENTS: None SLEEP RECORDINGS: Only awake and drowsy states were recorded. Drowsiness was characterized by attenuation of the posterior background rhythm. ACTIVATION PROCEDURES: Hyperventilation and photic stimulation were not performed. IMPRESSION: This EEG is consistent with a generalized nonspecific cerebral dysfunction.  There has been significant improvement since the previous recording and that no triphasic  waves are seen in the study. There was no seizure or evidence of seizure predisposition recorded on this study. Please note that lack of epileptiform activity on EEG does not preclude the possibility of epilepsy.  Roland Rack, MD [facility] [phone] If 7pm- 7am, please page neurology on call as listed in [place].      Scheduled Meds: . Chlorhexidine Gluconate Cloth  6 each Topical Daily  . darunavir-cobicistat  1 tablet Oral QHS  . dolutegravir  50 mg Oral QHS   And  . rilpivirine  25 mg Oral QHS  . enoxaparin (LOVENOX) injection  50 mg Subcutaneous Q12H  . feeding supplement (ENSURE ENLIVE)  237 mL Oral TID BM  . levothyroxine  100 mcg Intravenous Daily  . multivitamin with minerals  1 tablet Oral Daily   Continuous Infusions: . sodium chloride Stopped (06/22/19 0036)  . dextrose 100 mL/hr at 06/22/19 2124  . levETIRAcetam 250 mg (06/23/19 0953)     LOS: 20 days      Debbe Odea, MD [facility] Pager: [email or web address] Password [facility] 06/23/2019, 11:53 AM

## 2019-06-23 NOTE — Progress Notes (Signed)
Physical Therapy Treatment Patient Details Name: Kayla Price MRN: [id number] DOB: [birth year] Today's Date: 06/23/2019    History of Present Illness Kayla Price is a 63 y.o. female with medical history significant of metastatic breast cancer on chemotherapy, HIV disease, hyperlipidemia, chronic kidney disease stage II, hypertension, anemia of chronic disease, protein calorie malnutrition and s/p d/c home after 4 day admissinon for AKI and buttock wound.  She is admitted with continued weakness and with wound that could not be managed at home and urinary retention. Recently has had increased confusion and has been in respiratory acidosis, on and off on BiPAP.    PT Comments    Patient received in bed, restless and remains on BiPAP. RN reports that since speaking this morning, patient has actually started to get better and might possibly be rounding the bend, still ok for PROM today. Able to perform stretching of heel cords, PROM of B knees and elbows with patient intermittently resisting and without purposeful movements, did occasionally seem to follow a simple command and nodded slightly but difficult to truly assess cognition due to BiPAP limiting conversation. Spontaneously became more restless and began pushing at bipap so stopped all activity to avoid increasing restlessness/resistance to Bipap. Also able to spontaneously but non-purposefully move BLEs in bed today. Reduced frequency due to medical status, will continue to follow acutely and can certainly adjust POC and recommendations as appropriate moving forward.   Follow Up Recommendations  SNF     Equipment Recommendations  None recommended by PT    Recommendations for Other Services       Precautions / Restrictions Precautions Precautions: Fall;Other (comment) Precaution Comments: watch HR, increased confusion Restrictions Weight Bearing Restrictions: No    Mobility  Bed Mobility               General bed  mobility comments: PROM focus  Transfers                 General transfer comment: PROM focus  Ambulation/Gait             General Gait Details: PROM focus   Stairs             Wheelchair Mobility    Modified Rankin (Stroke Patients Only)       Balance                                            Cognition Arousal/Alertness: Awake/alert Behavior During Therapy: Restless;Anxious Overall Cognitive Status: Difficult to assess                                 General Comments: difficult to assess- on BiPAP, restless with many non-purposeful movements      Exercises      General Comments General comments (skin integrity, edema, etc.): session focus on PROM      Pertinent Vitals/Pain Pain Assessment: Faces Pain Score: 0-No pain Faces Pain Scale: No hurt Pain Intervention(s): Limited activity within patient's tolerance;Monitored during session    Home Living                      Prior Function            PT Goals (current goals can now be found in the care plan section) Acute Rehab  PT Goals Patient Stated Goal: to get stronger PT Goal Formulation: With patient Time For Goal Achievement: 06/30/19 Potential to Achieve Goals: Fair Progress towards PT goals: Not progressing toward goals - comment(limited session due ot medical status and being on bipap)    Frequency    Min 1X/week      PT Plan Frequency needs to be updated    Co-evaluation              AM-PAC PT "6 Clicks" Mobility   Outcome Measure  Help needed turning from your back to your side while in a flat bed without using bedrails?: A Lot Help needed moving from lying on your back to sitting on the side of a flat bed without using bedrails?: Total Help needed moving to and from a bed to a chair (including a wheelchair)?: Total Help needed standing up from a chair using your arms (e.g., wheelchair or bedside chair)?: Total Help  needed to walk in hospital room?: Total Help needed climbing 3-5 steps with a railing? : Total 6 Click Score: 7    End of Session   Activity Tolerance: Patient tolerated treatment well Patient left: in bed;with call bell/phone within reach;with bed alarm set Nurse Communication: Other (comment)(PT POC moving forward) PT Visit Diagnosis: Other abnormalities of gait and mobility (R26.89);Muscle weakness (generalized) (M62.81);Pain Pain - part of body: (buttocks and perineum)     Time: 6812-7517 PT Time Calculation (min) (ACUTE ONLY): 10 min  Charges:  $Therapeutic Exercise: 8-22 mins                     Windell Norfolk, DPT, PN1   Supplemental Physical Therapist [place]    Pager [phone] Acute Rehab Office [phone]

## 2019-06-23 NOTE — Progress Notes (Signed)
Nutrition Follow-up  DOCUMENTATION CODES:   Non-severe (moderate) malnutrition in context of chronic illness  INTERVENTION:   -Ensure Enlive po TID, each supplement provides 350 kcal and 20 grams of protein -Multivitamin with minerals daily  NUTRITION DIAGNOSIS:   Moderate Malnutrition related to chronic illness(metastatic breast cancer) as evidenced by mild fat depletion, severe muscle depletion, mild muscle depletion, percent weight loss.  Ongoing.  GOAL:   Patient will meet greater than or equal to 90% of their needs  Not meeting.  MONITOR:   PO intake, Supplement acceptance, Labs, Weight trends  ASSESSMENT:   63 year old patient admitted with weakness and wound that could not be managed at home. PMH of metastatic breast cancer on chemo, HIV, HLD, HTN, AKI, anemia of chronic disease and CKD stage 3. Pt found to have moisture associated skin breakdown.  Pt is now off BiPAP.  Patient is not consuming any meals, supplements and most medications. Will leave supplement orders in case pt does improve. Will continue to monitor.  Per palliative care note today, pt's daughter is hopeful for pt's recovery. Per oncology, pt with no treatment options.  Admission weight: 199 lbs. Current weight: 302 lbs. No edema documentation since 12/27.   I/Os: +825 ml since 12/15 Labs reviewed. Medications: Multivitamin with minerals daily  Diet Order:   Diet Order            Diet regular Room service appropriate? Yes; Fluid consistency: Thin  Diet effective now              EDUCATION NEEDS:   Education needs have been addressed  Skin:  Skin Assessment: Skin Integrity Issues: Skin Integrity Issues:: Other (Comment), Stage II Stage II: buttocks x2 Other: non-pressure wound- R/L groin  Last BM:  12/29 -type 1  Height:   Ht Readings from Last 1 Encounters:  06/19/19 5\' 7"  (1.702 m)    Weight:   Wt Readings from Last 1 Encounters:  06/23/19 (!) 137 kg    Ideal Body  Weight:  61.4 kg  BMI:  Body mass index is 47.3 kg/m.  Estimated Nutritional Needs:   Kcal:  2000-2200  Protein:  100-110  Fluid:  > 2L  Clayton Bibles, MS, RD, LDN Inpatient Clinical Dietitian Pager: [phone] After Hours Pager: [phone]

## 2019-06-23 NOTE — Progress Notes (Signed)
Daily Progress Note   Patient Name: Kayla Price       Date: 06/23/2019 DOB: [birth year]  Age: 63 y.o. MRN#: [id number] Attending Physician: Debbe Odea, MD Primary Care Physician: Lauree Chandler, NP Admit Date: 06/01/2019  Reason for Consultation/Follow-up: Establishing goals of care and Psychosocial/spiritual support  Subjective: I saw and examined. Kayla Price today.  She is off Bipap and is awake and alert.  She listens to me speak and tries to respond, but limited to 1-2 word answers.  Denies current needs.  Agrees when I note I am planning to contact Kayla Price to discuss today.  I called and was able to reach her daughter, Kayla Price.  Discussed her clinical course since over the last week as well as the fact she is more alert and off Bipap currently.  Kayla Price reports that she remains hopeful and "I am not giving up," but also reports it has been, "one step forward and two backward" this week.  She is agreeable to me reaching out again in a day or two to check in based on her mother's clinical course, and she states that she will call if things are worsening or she would like to discuss further.  Length of Stay: 20  Current Medications: Scheduled Meds:  . Chlorhexidine Gluconate Cloth  6 each Topical Daily  . darunavir-cobicistat  1 tablet Oral QHS  . dolutegravir  50 mg Oral QHS   And  . rilpivirine  25 mg Oral QHS  . enoxaparin (LOVENOX) injection  50 mg Subcutaneous Q12H  . feeding supplement (ENSURE ENLIVE)  237 mL Oral TID BM  . levothyroxine  100 mcg Intravenous Daily  . multivitamin with minerals  1 tablet Oral Daily    Continuous Infusions: . sodium chloride Stopped (06/22/19 0036)  . dextrose 100 mL/hr at 06/22/19 2124  . levETIRAcetam 250 mg (06/23/19 0953)    PRN  Meds: sodium chloride, acetaminophen **OR** acetaminophen, calcium carbonate (dosed in mg elemental calcium), camphor-menthol **AND** hydrOXYzine, docusate sodium, HYDROcodone-acetaminophen, LORazepam, morphine injection, ondansetron **OR** ondansetron (ZOFRAN) IV, sodium chloride flush, sorbitol, zolpidem  Physical Exam    Well developed but chronically ill appearing female. Not currently on Bipap CV:  irregular Resp: Regular, diminished  Abdomen soft, nt, nd Neuro: Engages and listens, but difficult to understand responses.  Vital Signs: BP 117/81 (  BP Location: Left Arm)   Pulse 96   Temp 98.1 F (36.7 C) (Axillary)   Resp 18   Ht 5\' 7"  (1.702 m)   Wt (!) 137 kg   LMP 11/06/2003   SpO2 95%   BMI 47.30 kg/m  SpO2: SpO2: 95 % O2 Device: O2 Device: High Flow Nasal Cannula O2 Flow Rate: O2 Flow Rate (L/min): 2 L/min  Intake/output summary:   Intake/Output Summary (Last 24 hours) at 06/23/2019 1436 Last data filed at 06/23/2019 1240 Gross per 24 hour  Intake -  Output 1125 ml  Net -1125 ml   LBM: Last BM Date: 06/23/19 Baseline Weight: Weight: 90.7 kg Most recent weight: Weight: (!) 137 kg       Palliative Assessment/Data: 10%    Flowsheet Rows     Most Recent Value  Intake Tab  Referral Department  -- [hematology]  Unit at Time of Referral  Other (Comment) [renal]  Palliative Care Primary Diagnosis  Cancer  Date Notified  06/12/19  Palliative Care Type  New Palliative care  Reason for referral  Clarify Goals of Care  Date of Admission  06/01/19  Date first seen by Palliative Care  06/13/19  # of days Palliative referral response time  1 Day(s)  # of days IP prior to Palliative referral  11  Clinical Assessment  Psychosocial & Spiritual Assessment  Palliative Care Outcomes      Patient Active Problem List   Diagnosis Date Noted  . Palliative care encounter   . Right lower lobe pneumonia 06/09/2019  . Pressure injury of skin 06/09/2019  . Acute hypoxemic  respiratory failure ([place]) 06/09/2019  . Right leg DVT ([place]) 06/09/2019  . Edema of right lower extremity 06/08/2019  . 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 Intertrigo 06/04/2019  . Pancytopenia ([place]) 06/04/2019  . Malnutrition of moderate degree 06/03/2019  . Alteration in skin integrity due to moisture 06/02/2019  . DNR (do not resuscitate) 06/02/2019  . AKI (acute kidney injury) ([place]) 05/28/2019  . Hyperkalemia 05/28/2019  . UTI (urinary tract infection) 05/28/2019  . Hypotension 05/28/2019  . Acute kidney injury superimposed on CKD ([place]) 05/28/2019  . Brain metastasis ([place]) 04/28/2019  . Hypothyroidism 04/28/2019  . Anemia of chronic disease 04/28/2019  . Encounter for antineoplastic chemotherapy 02/06/2019  . Port-A-Cath in place 08/01/2018  . Goals of care, counseling/discussion 06/26/2018  . Recurrent right pleural effusion 05/30/2018  . Malignant pleural effusion 05/19/2018  . Hilar adenopathy 05/15/2018  . Pleural effusion on right 04/25/2018  . Seasonal allergic rhinitis due to pollen 01/16/2018  . Aortic atherosclerosis ([place]) 11/11/2017  . Morbid obesity with BMI of 40.0-44.9, adult ([place]) 11/11/2017  . Genetic testing 06/27/2017  . Family history of non-Hodgkin's lymphoma   . Family history of lung cancer   . Endometrial thickening on ultrasound 01/25/2017  . Metastatic breast cancer ([place]) 11/05/2016  . Malignant neoplasm of upper-outer quadrant of right breast in female, estrogen receptor negative ([place]) 10/09/2016  . Primary localized osteoarthritis of left knee 02/06/2016  . Primary osteoarthritis of left knee 02/05/2016  . Osteoarthritis of left knee 06/08/2015  . CKD (chronic kidney disease), stage III 06/08/2015  . Vitamin D deficiency 09/23/2014  . Acute renal insufficiency 04/23/2014  . 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 Bell's palsy 12/04/2012  . Contact dermatitis 08/29/2011  . Dry eyes 08/20/2011  . Dry mouth 08/20/2011  .  Neuropathy 04/09/2011  . Insomnia 04/09/2011  . Obesities, morbid ([place]) 09/27/2010  .  Endometrial polyp 12/22/2009  . Alopecia areata 11/28/2009  . MENORRHAGIA, POSTMENOPAUSAL 02/03/2009  . ABNORMAL GLANDULAR PAPANICOLAOU SMEAR OF CERVIX 11/04/2008  . ALOPECIA 05/06/2008  . Trigger finger, acquired 05/06/2008  . HIP FRACTURE, RIGHT 05/06/2008  . ARTHROSCOPY, LEFT KNEE, HX OF 02/03/2008  . HYPERLIPIDEMIA, MIXED 12/15/2007  . HAND PAIN, RIGHT 12/15/2007  . Other abnormal glucose 12/15/2007  . UNSPECIFIED VITAMIN D DEFICIENCY 08/06/2007  . TINEA CAPITIS 08/04/2007  . CNTC DERMATITIS&OTH ECZEMA DUE OTH CHEM PRODUCTS 08/04/2007  . PVD 04/07/2007  . Secondary localized osteoarthrosis 04/07/2007  . Human immunodeficiency virus (HIV) disease ([place]) 03/27/2006  . HTN (hypertension) 03/27/2006  . BREAST CANCER, HX OF 03/27/2006    Palliative Care Plan    Recommendations/Plan:  Ms. Leisey appears to be improved today compared to last several days in regard to her mental status.  Palliative to continue to follow and progress conversation regarding goals based upon her clinical course.  Code Status:  DNR  Prognosis:  Guarded  Discharge Planning: To be determined  Care plan was discussed with daughter.  Thank you for allowing the Palliative Medicine Team to assist in the care of this patient.  Total time spent:  35 min.     Greater than 50%  of this time was spent counseling and coordinating care related to the above assessment and plan.  Micheline Rough, MD [facility] Team [phone]   Please contact Palliative MedicineTeam phone at [phone] for questions and concerns between 7 am - 7 pm.   Please see AMION for individual provider pager numbers.

## 2019-06-23 NOTE — Progress Notes (Signed)
Spoke with RN Minette Brine about medical status, she confirms patient continues to do very poorly and likely nearing end of life. States OK for PROM in bed. Plan for PROM session with family education (if they are present/would like PT to do so) and sign off today.  Windell Norfolk, DPT, PN1   Supplemental Physical Therapist [facility]    Pager [phone] Acute Rehab Office [phone]

## 2019-06-24 ENCOUNTER — Ambulatory Visit: Payer: POS

## 2019-06-24 ENCOUNTER — Other Ambulatory Visit: Payer: POS

## 2019-06-24 ENCOUNTER — Inpatient Hospital Stay (HOSPITAL_COMMUNITY): Payer: Medicare HMO

## 2019-06-24 ENCOUNTER — Ambulatory Visit: Payer: POS | Admitting: Oncology

## 2019-06-24 LAB — BASIC METABOLIC PANEL
Anion gap: 5 (ref 5–15)
BUN: 11 mg/dL (ref 8–23)
CO2: 26 mmol/L (ref 22–32)
Calcium: 7.9 mg/dL — ABNORMAL LOW (ref 8.9–10.3)
Chloride: 108 mmol/L (ref 98–111)
Creatinine, Ser: 0.92 mg/dL (ref 0.44–1.00)
GFR calc Af Amer: >60 mL/min (ref >60)
GFR calc non Af Amer: >60 mL/min (ref >60)
Glucose, Bld: 138 mg/dL — ABNORMAL HIGH (ref 70–99)
Potassium: 3.4 mmol/L — ABNORMAL LOW (ref 3.5–5.1)
Sodium: 139 mmol/L (ref 135–145)

## 2019-06-24 LAB — BLOOD GAS, ARTERIAL
Acid-Base Excess: 1.7 mmol/L (ref 0.0–2.0)
Bicarbonate: 28.3 mmol/L — ABNORMAL HIGH (ref 20.0–28.0)
Drawn by: 57060
FIO2: 28
O2 Saturation: 93.2 %
Patient temperature: 36.5
pCO2 arterial: 65.6 mmHg (ref 32.0–48.0)
pH, Arterial: 7.254 — ABNORMAL LOW (ref 7.350–7.450)
pO2, Arterial: 69.9 mmHg — ABNORMAL LOW (ref 83.0–108.0)

## 2019-06-24 LAB — CBC
HCT: 27.9 % — ABNORMAL LOW (ref 36.0–46.0)
Hemoglobin: 8.4 g/dL — ABNORMAL LOW (ref 12.0–15.0)
MCH: 31.6 pg (ref 26.0–34.0)
MCHC: 30.1 g/dL (ref 30.0–36.0)
MCV: 104.9 fL — ABNORMAL HIGH (ref 80.0–100.0)
Platelets: 243 10*3/uL (ref 150–400)
RBC: 2.66 MIL/uL — ABNORMAL LOW (ref 3.87–5.11)
RDW: 24.3 % — ABNORMAL HIGH (ref 11.5–15.5)
WBC: 10.7 10*3/uL — ABNORMAL HIGH (ref 4.0–10.5)
nRBC: 1.4 % — ABNORMAL HIGH (ref 0.0–0.2)

## 2019-06-24 MED ORDER — LORAZEPAM 2 MG/ML IJ SOLN
0.5000 mg | Freq: Four times a day (QID) | INTRAMUSCULAR | Status: DC | PRN
  Administered 2019-06-30: 0.5 mg via INTRAVENOUS
  Filled 2019-06-24: qty 1

## 2019-06-24 NOTE — Progress Notes (Signed)
CRITICAL VALUE ALERT  Critical Value: PCO2 65.5  Date & Time Notied:  06/24/19 , 1625  Provider Notified: Dr Verlon Au  Orders Received/Actions taken: placed on bipap by RT

## 2019-06-24 NOTE — Progress Notes (Signed)
Placed pt back on bipap due to increase WOB. RN aware.

## 2019-06-24 NOTE — Progress Notes (Signed)
Patient was placed on 3 liters via [place] with a saturation 98%  She was alert and follow commands.  ABG drawn on [place].  Pt placed on bipap after ABG for grunting.

## 2019-06-24 NOTE — Progress Notes (Addendum)
Attempted to maintain O2 <95% w/out BIPAP. Pt's RR trending in 30's & O2 sats in 90's, w/ accessory muscle use and constant groaning on [place] and HFNC. Spoke w/ on provider on call and contacted respiratory for assessment. Pt was placed back on BIPAP per respiratory protocol and NP on call recommendation. Pt now resting comfortably.

## 2019-06-24 NOTE — Progress Notes (Signed)
Hospitalist daily note   Kayla Price [id number] DOB: 08/31/1955 DOA: 06/01/2019  PCP: Lauree Chandler, NP  Narrative:  63 year old black female metastatic breast cancer status post lumpectomy 03/2004 with radical mastectomy 2018-status post Keytruda, Tecentriq with Neulasta--and widely metastatic breast cancer with poor performance Metastatic disease with visual deficits based on intracranial spread and vasogenic edema HIV followed by Dr. Tommy Medal HLD, HTN, HLD, CKD 3, anemia of chronic disease  Admit 06/01/2019 weakness--- was admitted 12 2 through 12/6 for hypotension along with ulcers in her genital and buttock region and was found to be hypotensive and Benicar was stopped She was admitted with AKI decubitus ulcers--ulcers were tested positive for HSV and she completed Valtrex-hospitalization complicated by, right lower extremity DVT, acute hypoxic respiratory failure-transferred to stepdown 12/17 and palliative medicine saw the patient she also had metabolic encephalopathy  Data Reviewed:  BUN/creatinine 11/0.9 Potassium 3.4 WBC down from 13.2-10.7 Hemoglobin 9.4-->8.4 Platelet 243  Assessment & Plan: Toxic metabolic encephalopathy secondary to cefepime Work-up completed not really showing any source could be secondary to cefepime as per neurology she had EEG on 12/24 on 12/27 showing diffuse encephalopathy initially and then subsequently nonspecific dysfunction She is unable to really orient and is on BiPAP Acute hypercarbic respiratory failure Has been intermittently on BiPAP since 12/27 and has needed it since this morning I will talk with the daughter regarding further plans 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 Wynelle Cleveland discussed with Dr. Drucilla Schmidt on 12/24 and [place] discontinued Extensive LLE DVT Continue on Lovenox Metastatic breast cancer to brain and right pleural space Status post whole brain radiation 11/20 chemo and mastectomies  Called and discussed with daughther [phone] is surprised that the patient is doing poorly-patient was allegedly doing "much better" yesterday and was eating and drinking and was able to orient to the daughter lovenox  DNR Inpatient not ready for d/c yet  Subjective: Cannot get ROS--she mumbles to me Cannot orient to me  Consultants:   CCM Procedures:   Multiple Antimicrobials:   Completed   Objective: Vitals:   06/23/19 2100 06/23/19 2257 06/24/19 0409 06/24/19 0512  BP: 110/74   112/82  Pulse: 93 98 99 94  Resp: (!) 30 (!) 32 (!) 35 (!) 26  Temp: 97.6 F (36.4 C)   97.9 F (36.6 C)  TempSrc: Axillary   Axillary  SpO2: 95% 96% 96% 100%  Weight:    (!) 137.4 kg  Height:        Intake/Output Summary (Last 24 hours) at 06/24/2019 1206 Last data filed at 06/24/2019 1004 Gross per 24 hour  Intake --  Output 1850 ml  Net -1850 ml   Filed Weights   06/22/19 0300 06/23/19 0430 06/24/19 0512  Weight: (!) 138.3 kg (!) 137 kg (!) 137.4 kg    Examination: Not very responsive EOMI NCAT no focal deficit Abdomen soft nontender no rebound Port-A-Cath in place right upper chest Does not really respond to commands No lower extremity edema  Scheduled Meds: . Chlorhexidine Gluconate Cloth  6 each Topical Daily  . darunavir-cobicistat  1 tablet Oral QHS  . dolutegravir  50 mg Oral QHS   And  . rilpivirine  25 mg Oral QHS  .  enoxaparin (LOVENOX) injection  50 mg Subcutaneous Q12H  . feeding supplement (ENSURE ENLIVE)  237 mL Oral TID BM  . levothyroxine  100 mcg Intravenous Daily  . multivitamin with minerals  1 tablet Oral Daily   Continuous Infusions: . sodium chloride  Stopped (06/22/19 0036)  . dextrose 100 mL/hr at 06/24/19 0526  . levETIRAcetam 250 mg (06/24/19 1048)     LOS: 21 days   Time spent: [place], MD [facility]

## 2019-06-24 NOTE — Progress Notes (Signed)
HEMATOLOGY-ONCOLOGY PROGRESS NOTE  SUBJECTIVE: Kayla Price is back on BiPAP.  Opens her eyes when I say her name.  Attempts to speak, but I cannot make out what she is saying.  Appears to be short of breath.  No family at the bedside.  Oncology History  Malignant neoplasm of upper-outer quadrant of right breast in female, estrogen receptor negative ([place])  03/2004 Surgery   Right lumpectomy and SLNB for IDC, 0.5cm, 1/2 + SLN, grade 3, ER-, PR-, HER-2 -.  Treated with Doxorubicin and Cyclophosphamide x 4, followed by weekly Paclitaxel x 7 and adjuvant radiation.    10/01/2016 Initial Biopsy   Right breast upper outer quadrant biopsy: IDC, DCIS, triple negative, grade 3. Lymph node: Positive   11/05/2016 Surgery   Right modified radical mastectomy: IDC, grade 3, 2.6 cm, +LVI, triple negative, margins negative, 2/10 LN + metastases.  T2, N1a   11/20/2016 - 03/11/2017 Adjuvant Chemotherapy   Gemcitabine/Carboplatin given on days 1 and 8 on a 21 day cycle x 6 cycles   07/15/2018 - 07/15/2018 Chemotherapy   The patient had pembrolizumab (KEYTRUDA) 200 mg in sodium chloride 0.9 % 50 mL chemo infusion, 200 mg, Intravenous, Once, 0 of 6 cycles  for chemotherapy treatment.    07/15/2018 - 01/01/2019 Chemotherapy   The patient had atezolizumab (TECENTRIQ) 840 mg in sodium chloride 0.9 % 250 mL chemo infusion, 840 mg, Intravenous, Once, 6 of 8 cycles Dose modification: 1,680 mg (original dose 840 mg, Cycle 4, Reason: Provider Judgment), 840 mg (original dose 840 mg, Cycle 4, Reason: Provider Judgment) Administration: 840 mg (07/15/2018), 840 mg (08/01/2018), 840 mg (08/15/2018), 840 mg (08/29/2018), 840 mg (09/12/2018), 840 mg (09/26/2018), 840 mg (10/10/2018), 840 mg (10/24/2018), 1,680 mg (11/07/2018), 1,680 mg (12/05/2018)  for chemotherapy treatment.    12/19/2018 -  Chemotherapy   The patient had pegfilgrastim (NEULASTA) injection 6 mg, 6 mg, Subcutaneous, Once, 1 of 1 cycle Administration: 6 mg  (03/28/2019) pegfilgrastim (NEULASTA ONPRO KIT) injection 6 mg, 6 mg, Subcutaneous, Once, 4 of 8 cycles Administration: 6 mg (01/09/2019), 6 mg (01/23/2019), 6 mg (02/06/2019), 6 mg (02/20/2019), 6 mg (04/03/2019), 6 mg (04/17/2019) eriBULin mesylate (HALAVEN) 2.75 mg in sodium chloride 0.9 % 100 mL chemo infusion, 1.2 mg/m2 = 2.75 mg (100 % of original dose 1.2 mg/m2), Intravenous,  Once, 5 of 9 cycles Dose modification: 1.2 mg/m2 (original dose 1.2 mg/m2, Cycle 1, Reason: Provider Judgment) Administration: 2.75 mg (12/19/2018), 2.75 mg (01/09/2019), 2.75 mg (01/23/2019), 2.75 mg (02/06/2019), 2.75 mg (02/20/2019), 2.75 mg (03/06/2019), 2.75 mg (03/25/2019), 2.75 mg (04/03/2019), 2.75 mg (04/17/2019)  for chemotherapy treatment.    01/02/2019 - 01/02/2019 Chemotherapy   The patient had atezolizumab (TECENTRIQ) 1,200 mg in sodium chloride 0.9 % 250 mL chemo infusion, 1,200 mg, Intravenous, Once, 0 of 6 cycles  for chemotherapy treatment.    Metastatic breast cancer ([place])  11/05/2016 Initial Diagnosis   Recurrent cancer of right breast ([place])   07/15/2018 - 01/01/2019 Chemotherapy   The patient had atezolizumab (TECENTRIQ) 840 mg in sodium chloride 0.9 % 250 mL chemo infusion, 840 mg, Intravenous, Once, 5 of 8 cycles Dose modification: 1,680 mg (original dose 840 mg, Cycle 4, Reason: Provider Judgment), 840 mg (original dose 840 mg, Cycle 4, Reason: Provider Judgment) Administration: 840 mg (07/15/2018), 840 mg (08/01/2018), 840 mg (08/15/2018), 840 mg (08/29/2018), 840 mg (09/12/2018), 840 mg (09/26/2018), 840 mg (10/10/2018), 840 mg (10/24/2018), 1,680 mg (11/07/2018)  for chemotherapy treatment.    12/19/2018 -  Chemotherapy   The patient  had pegfilgrastim (NEULASTA) injection 6 mg, 6 mg, Subcutaneous, Once, 1 of 1 cycle Administration: 6 mg (03/28/2019) pegfilgrastim (NEULASTA ONPRO KIT) injection 6 mg, 6 mg, Subcutaneous, Once, 4 of 8 cycles Administration: 6 mg (01/09/2019), 6 mg (01/23/2019), 6 mg (02/06/2019), 6 mg  (02/20/2019), 6 mg (04/03/2019), 6 mg (04/17/2019) eriBULin mesylate (HALAVEN) 2.75 mg in sodium chloride 0.9 % 100 mL chemo infusion, 1.2 mg/m2 = 2.75 mg (100 % of original dose 1.2 mg/m2), Intravenous,  Once, 5 of 9 cycles Dose modification: 1.2 mg/m2 (original dose 1.2 mg/m2, Cycle 1, Reason: Provider Judgment) Administration: 2.75 mg (12/19/2018), 2.75 mg (01/09/2019), 2.75 mg (01/23/2019), 2.75 mg (02/06/2019), 2.75 mg (02/20/2019), 2.75 mg (03/06/2019), 2.75 mg (03/25/2019), 2.75 mg (04/03/2019), 2.75 mg (04/17/2019)  for chemotherapy treatment.      PHYSICAL EXAMINATION: ECOG PERFORMANCE STATUS: 4 - Bedbound  Vitals:   06/24/19 0409 06/24/19 0512  BP:  112/82  Pulse: 99 94  Resp: (!) 35 (!) 26  Temp:  97.9 F (36.6 C)  SpO2: 96% 100%   Filed Weights   06/22/19 0300 06/23/19 0430 06/24/19 0512  Weight: (!) 305 lb (138.3 kg) (!) 302 lb (137 kg) (!) 303 lb (137.4 kg)    Intake/Output from previous day: 12/29 0701 - 12/30 0700 In: -  Out: 1300 [Urine:1300]  GENERAL: Opens eyes to my voice, attempts to speak LUNGS: Appears short of breath, clear to auscultation bilaterally HEART: Irregular rhythm ABDOMEN:abdomen soft, non-tender and normal bowel sounds NEURO: Opens eyes to my voice  LABORATORY DATA:  I have reviewed the data as listed CMP Latest Ref Rng & Units 06/24/2019 06/23/2019 06/22/2019  Glucose 70 - 99 mg/dL 138(H) 108(H) 121(H)  BUN 8 - 23 mg/dL _0 Creatinine 0.44 - 1.00 mg/dL 0.92 0.97 1.03(H)  Sodium 135 - 145 mmol/L 139 144 146(H)  Potassium 3.5 - 5.1 mmol/L 3.4(L) 3.5 3.7  Chloride 98 - 111 mmol/L 108 112(H) 114(H)  CO2 22 - 32 mmol/L _1 Calcium 8.9 - 10.3 mg/dL 7.9(L) 7.8(L) 8.2(L)  Total Protein 6.5 - 8.1 g/dL - - -  Total Bilirubin 0.3 - 1.2 mg/dL - - -  Alkaline Phos 38 - 126 U/L - - -  AST 15 - 41 U/L - - -  ALT 0 - 44 U/L - - -    Lab Results  Component Value Date   WBC 10.7 (H) 06/24/2019   HGB 8.4 (L) 06/24/2019   HCT 27.9 (L)  06/24/2019   MCV 104.9 (H) 06/24/2019   PLT 243 06/24/2019   NEUTROABS 1.8 06/01/2019    EEG  Result Date: 06/21/2019 Greta Doom, MD     06/21/2019 12:38 PM Patient Name: WISDOM SEYBOLD MRN: [id number] EEG Attending: Roland Rack Referring Physician/Provider: Roland Rack Date: 06/21/2019 Duration: 24 minutes Patient history: 63 yo F with multiple intracranial metastasis with encephalopathy. Recent EEG with triphasic waves. Level of alertness: Encephalopathic AEDs during EEG study: Keppra 56m BID Technical aspects: This EEG study was done with scalp electrodes positioned according to the 10-20 International system of electrode placement. Electrical activity was acquired at a sampling rate of _2  and reviewed with a high frequency filter of _3  and a low frequency filter of _4 . EEG data were recorded continuously and digitally stored. BACKGROUND ACTIVITY:  The background consist predominately of generalized irregular slow activity with some frontocentral predominant beta range activity.  There are poorly formed sleep structures at times.  No definite posterior dominant rhythm was seen, however  following stimulation there was briefly a posteriorly predominant rhythm of 8 Hz but this was very briefly sustained. EPILEPTIFORM ACTIVITY: Interictal epileptiform activity: None Ictal Activity: None OTHER EVENTS: None SLEEP RECORDINGS: Only awake and drowsy states were recorded. Drowsiness was characterized by attenuation of the posterior background rhythm. ACTIVATION PROCEDURES: Hyperventilation and photic stimulation were not performed. IMPRESSION: This EEG is consistent with a generalized nonspecific cerebral dysfunction.  There has been significant improvement since the previous recording and that no triphasic waves are seen in the study. There was no seizure or evidence of seizure predisposition recorded on this study. Please note that lack of epileptiform activity on EEG does  not preclude the possibility of epilepsy.  Roland Rack, MD [facility] [phone] If 7pm- 7am, please page neurology on call as listed in [place].   CT ANGIO CHEST PE W OR WO CONTRAST  Result Date: 06/09/2019 CLINICAL DATA:  Shortness of breath.  Metastatic breast cancer. EXAM: CT ANGIOGRAPHY CHEST WITH CONTRAST TECHNIQUE: Multidetector CT imaging of the chest was performed using the standard protocol during bolus administration of intravenous contrast. Multiplanar CT image reconstructions and MIPs were obtained to evaluate the vascular anatomy. CONTRAST:  41m OMNIPAQUE IOHEXOL 350 MG/ML SOLN COMPARISON:  PET CT 03/11/2019.  Chest CT 04/22/2018 FINDINGS: Cardiovascular: No filling defects in the pulmonary arteries to suggest pulmonary emboli. Tortuous, ectatic aorta with maximum diameter 3.7 cm. No dissection. Heart is enlarged. Mediastinum/Nodes: Extensive adenopathy in the mediastinum. Surgical clips in the right axilla. This is unchanged since recent PET CT. Lungs/Pleura: Extensive pleural nodularity/disease throughout right hemithorax, similar to recent PET CT. Right middle lobe mass is unchanged. Central necrotic portion again noted, unchanged. Scattered ground-glass airspace disease noted within both lungs concerning for pneumonia. Upper Abdomen: Gastrohepatic ligament lymph nodes again noted, unchanged. No acute findings. Musculoskeletal: Prior right mastectomy. Right chest wall Port-A-Cath remains in place, unchanged. Sclerotic lesion within the T11 vertebral body is stable. Review of the MIP images confirms the above findings. IMPRESSION: No evidence of pulmonary embolus. Extensive mediastinal adenopathy, right pleural metastatic disease, and right middle lobe mass are all stable since prior PET CT. Worsening ground-glass airspace disease throughout both lungs concerning for pneumonia. Cardiomegaly. Tortuous, ectatic thoracic aorta. Electronically Signed   By: KRolm BaptiseM.D.    On: 06/09/2019 17:44   MR BRAIN W WO CONTRAST  Result Date: 06/18/2019 CLINICAL DATA:  Encephalopathy. Metastatic breast cancer. Known brain metastases. EXAM: MRI HEAD WITHOUT AND WITH CONTRAST TECHNIQUE: Multiplanar, multiecho pulse sequences of the brain and surrounding structures were obtained without and with intravenous contrast. CONTRAST:  969mGADAVIST GADOBUTROL 1 MMOL/ML IV SOLN COMPARISON:  MR head without and with contrast 04/28/2019 FINDINGS: Brain: The right occipital lesion has decreased somewhat. There is mostly peripheral enhancement with a nodular component posteriorly. The lesion now measures 2.4 x 2.6 x 2.1 cm. The lesion previously measured 3.4 x 3.1 x 2.7 cm in the same dimensions. A left occipital lesion previously measured 13 x 11 mm. It now measures 13 x 9 mm. A more lateral lesion in the left occipital lobe has decreased from 9 mm to 6.5 mm. A lesion in the lateral left temporal lobe has decreased from 6.53.5 mm on image 23 of series 10. A medial right temporal lobe lesion on image 25 has decreased from 7.5 mm to 6 mm. A lesion in the left paramedian superior cerebellar vermis now measures 4 mm. Previously measured 7 mm. A lesion in the left paramedian pons previously measured 4.5 mm. It  is now punctate in size. A lesion in the anterior left frontal lobe white matter on image 36 as decreased from 7.5 mm to 3.5 mm. A punctate lesion in the left corona radiata previously measured 4 mm. Punctate lesion is again noted in the posterior left frontal lobe on image 45. No new enhancing lesions are present. Vasogenic edema is most prominent about the occipital lobe lesions. This has decreased since the prior exam. No acute infarct or hemorrhage is present. Ventricles are of normal size. No significant extraaxial fluid collection is present. Vascular: Flow is present in the major intracranial arteries. Skull and upper cervical spine: Extensive marrow signal changes in the calvarium ir consistent  with osseous metastases. Heterogeneous marrow signal is present in the upper cervical spine. Sinuses/Orbits: Minimal fluid is present in the left maxillary sinus. A fluid level is present in the left sphenoid sinus. Left ethmoid air cells are partially opacified. The paranasal sinuses and mastoid air cells are otherwise clear. IMPRESSION: 1. Decrease in size of multiple brain metastases identified on the prior exam. No new lesions are present. 2. Decreased vasogenic edema, still predominantly occipital lobes. 3. Diffuse marrow signal changes in the skull concerning for metastatic disease. 4. Left maxillary and sphenoid sinus disease. Electronically Signed   By: San Morelle M.D.   On: 06/18/2019 18:44   US RENAL  Result Date: 05/28/2019 CLINICAL DATA:  Acute kidney injury. EXAM: RENAL / URINARY TRACT ULTRASOUND COMPLETE COMPARISON:  PET CT scan 03/11/2019. FINDINGS: Right Kidney: Renal measurements: 9.9 x 4.3 x 5.0 cm = volume: 109.7 mL . Echogenicity within normal limits. No mass or hydronephrosis visualized. Left Kidney: Renal measurements: 9.6 x 5.4 x 4.9 cm = volume: Is 134.3. mL. Echogenicity within normal limits. No mass or hydronephrosis visualized. Bladder: Appears normal for degree of bladder distention. Other: Small right pleural effusion and gallstones are noted as seen on the prior exam. IMPRESSION: Negative for hydronephrosis.  Normal appearing kidneys. Right pleural effusion. Gallstones. Electronically Signed   By: Inge Rise M.D.   On: 05/28/2019 07:15   DG CHEST PORT 1 VIEW  Result Date: 06/24/2019 CLINICAL DATA:  History of breast cancer. Shortness of breath. Pneumonia. EXAM: PORTABLE CHEST 1 VIEW COMPARISON:  Single-view of the chest 06/12/2019, 06/18/2019 and 06/19/2019. FINDINGS: Right Port-A-Cath remains in place. Extensive airspace disease in the right lung persist but aeration in the right upper lung zone is mildly improved since the most recent study. Airspace disease  in the left lung has markedly improved since the most recent examination. There is some residual atelectasis in the left mid lung zone. Cardiac silhouette is largely obscured. Surgical clips in the right axilla and right IJ approach Port-A-Cath are noted. IMPRESSION: Improved aeration in the left mid lung zone with residual atelectasis noted. Large right pleural effusion and airspace disease persist although aeration in the right upper lung zone appears mildly improved since the most recent study. Electronically Signed   By: Inge Rise M.D.   On: 06/24/2019 13:33   DG CHEST PORT 1 VIEW  Result Date: 06/19/2019 CLINICAL DATA:  Unresponsive and agitated. History metastatic breast carcinoma with known extensive right-sided pleural and parenchymal metastatic disease. EXAM: PORTABLE CHEST 1 VIEW COMPARISON:  06/18/2019 FINDINGS: Stable appearance of Port-A-Cath. Stable volume loss and poor aeration of the right lung secondary to extensive pleural tumor and associated loculated right pleural fluid. Lower volume of the left lung with persistent dense infiltrate in the left mid lung and probable component of infiltrate at  the left lung base. No pneumothorax. No significant left-sided pleural fluid identified. IMPRESSION: Stable extensive pleural and parenchymal disease in the right chest with associated loculated right pleural fluid. Stable dense infiltrate in the left mid lung and probable component of infiltrate at the left lung base. Electronically Signed   By: Aletta Edouard M.D.   On: 06/19/2019 14:37   DG CHEST PORT 1 VIEW  Result Date: 06/18/2019 CLINICAL DATA:  Hypoxia EXAM: PORTABLE CHEST 1 VIEW COMPARISON:  06/13/2019 FINDINGS: Right Port-A-Cath remains in place, unchanged. Cardiomegaly. Diffuse right lung and patchy left lung airspace disease again noted, not significantly changed. Possible right effusion. No acute bony abnormality. IMPRESSION: No significant change since prior study.  Electronically Signed   By: Rolm Baptise M.D.   On: 06/18/2019 09:17   DG CHEST PORT 1 VIEW  Result Date: 06/13/2019 CLINICAL DATA:  Hypoxia. EXAM: PORTABLE CHEST 1 VIEW COMPARISON:  June 12, 2019 FINDINGS: The right Port-A-Cath is stable. No pneumothorax. Infiltrate in the left mid lung is more focal in the interval. Infiltrate in the right lung, particularly in the lower right lung is similar in the interval. The cardiomediastinal silhouette is stable. No other interval changes. Stable cardiomediastinal silhouette. IMPRESSION: 1. Stable infiltrate on the right, particularly in the right base. More focal infiltrate in the left mid lung compared to the previous study. Stable right Port-A-Cath. Electronically Signed   By: Dorise Bullion III M.D   On: 06/13/2019 15:45   DG Chest Port 1 View  Result Date: 06/12/2019 CLINICAL DATA:  Hypoxia. Metastatic breast cancer. EXAM: PORTABLE CHEST 1 VIEW COMPARISON:  CTA chest 06/09/2019. One-view chest x-ray 06/08/2019. FINDINGS: The heart size is normal. The heart is enlarged. Right IJ Port-A-Cath is stable. A right pleural effusion has slightly increased. Right greater than left airspace opacities have progressed. Surgical clips are noted in the right axilla. IMPRESSION: 1. Progressive right greater than left airspace disease compatible with pneumonia. 2. Slight increase in right pleural effusion. 3. Stable right IJ Port-A-Cath. Electronically Signed   By: San Morelle M.D.   On: 06/12/2019 09:29   DG CHEST PORT 1 VIEW  Result Date: 06/08/2019 CLINICAL DATA:  Hypoxia EXAM: PORTABLE CHEST 1 VIEW COMPARISON:  07/10/2018 FINDINGS: Right Port-A-Cath in place with the tip in the SVC. Airspace disease in the right lower lung concerning for pneumonia. No confluent opacity on the left. Heart is borderline in size. Possible small right effusion. No acute bony abnormality. IMPRESSION: Right lower lung airspace disease concerning for pneumonia. Small right  effusion suspected. Electronically Signed   By: Rolm Baptise M.D.   On: 06/08/2019 18:55   EEG adult  Result Date: 06/18/2019 Lora Havens, MD     06/18/2019  8:55 PM Patient Name: MAZZY SANTARELLI MRN: [id number] Epilepsy Attending: Lora Havens Referring Physician/Provider: Dr Debbe Odea Date: 06/18/2019 Duration: 24.56 mins Patient history: 63yo F with HIV, brain mets and now with ams. EEG to evaluate for seizure Level of alertness: awake AEDs during EEG study: None Technical aspects: This EEG study was done with scalp electrodes positioned according to the 10-20 International system of electrode placement. Electrical activity was acquired at a sampling rate of _0  and reviewed with a high frequency filter of _1  and a low frequency filter of _2 . EEG data were recorded continuously and digitally stored. DESCRIPTION: During awake state, no clear posterior dominant rhythm. EEG also showed continuous generalized  3-_3  theta-delta slowing. Triphasic waves, generalized, maximal bifrontal, at _4  were also noted.  Hyperventilation and photic stimulation were not performed. ABNORMALITY - Continuous slow, generalized - Triphasic waves, generalized IMPRESSION: This study is  Suggestive of severe diffuse encephalopathy, non specific to etiology but could be secondary to toxic-metabolic causes, cepefime toxicity. No seizures or definite epileptiform discharges were seen throughout the recording. Lora Havens   ECHOCARDIOGRAM COMPLETE  Result Date: 06/12/2019   ECHOCARDIOGRAM REPORT   Patient Name:   TIMARIE LABELL Date of Exam: 06/12/2019 Medical Rec #:  [id number]         Height:       67.0 in Accession #:    [id number]        Weight:       198.0 lb Date of Birth:  05/15/1956         BSA:          2.01 m Patient Age:    76 years          BP:           128/76 mmHg Patient Gender: F                 HR:           108 bpm. Exam Location:  Inpatient Procedure: 2D Echo, Color Doppler and Cardiac  Doppler Indications:    R06.9 DOE  History:        Patient has no prior history of Echocardiogram examinations.                 Risk Factors:Hypertension and Dyslipidemia.  Sonographer:    Raquel Sarna Senior RDCS Referring Phys: [place]  1. Left ventricular ejection fraction, by visual estimation, is 65 to 70%. The left ventricle has normal function. There is no left ventricular hypertrophy.  2. The left ventricle has no regional wall motion abnormalities.  3. Global right ventricle has normal systolic function.The right ventricular size is mildly enlarged. No increase in right ventricular wall thickness.  4. Left atrial size was normal.  5. Right atrial size was severely dilated.  6. The mitral valve is normal in structure. Mild mitral valve regurgitation. No evidence of mitral stenosis.  7. The tricuspid valve is normal in structure. Tricuspid valve regurgitation is mild.  8. The aortic valve is tricuspid. Aortic valve regurgitation is not visualized. No evidence of aortic valve sclerosis or stenosis.  9. There is severe calcifcation of the aortic valve. 10. The pulmonic valve was normal in structure. Pulmonic valve regurgitation is trivial. 11. Severely elevated pulmonary artery systolic pressure at 82NFAO. 12. The inferior vena cava is normal in size with greater than 50% respiratory variability, suggesting right atrial pressure of 3 mmHg. 13. The interatrial septum appears to be lipomatous. FINDINGS  Left Ventricle: Left ventricular ejection fraction, by visual estimation, is 65 to 70%. The left ventricle has normal function. The left ventricle has no regional wall motion abnormalities. There is no left ventricular hypertrophy. Normal left atrial pressure. Right Ventricle: The right ventricular size is mildly enlarged. No increase in right ventricular wall thickness. Global RV systolic function is has normal systolic function. The tricuspid regurgitant velocity is 4.00 m/s, and with an assumed  right atrial  pressure of 8 mmHg, the estimated right ventricular systolic pressure is severely elevated at 72.0 mmHg. Left Atrium: Left atrial size was normal in size. Right Atrium: Right atrial size was severely dilated Pericardium: There is no evidence of pericardial effusion. Mitral Valve: The mitral valve is normal in structure. Mild mitral valve  regurgitation. No evidence of mitral valve stenosis by observation. Tricuspid Valve: The tricuspid valve is normal in structure. Tricuspid valve regurgitation is mild. Aortic Valve: The aortic valve is tricuspid. . There is moderate thickening and severe calcifcation of the aortic valve. Aortic valve regurgitation is not visualized. The aortic valve is structurally normal, with no evidence of sclerosis or stenosis. There is moderate thickening of the aortic valve. There is severe calcifcation of the aortic valve. Pulmonic Valve: The pulmonic valve was normal in structure. Pulmonic valve regurgitation is trivial. Pulmonic regurgitation is trivial. Aorta: The aortic root, ascending aorta and aortic arch are all structurally normal, with no evidence of dilitation or obstruction. Venous: The inferior vena cava is normal in size with greater than 50% respiratory variability, suggesting right atrial pressure of 3 mmHg. IAS/Shunts: Increased thickness of the atrial septum sparing the fossa ovalis consistent with The interatrial septum appears to be lipomatous. No atrial level shunt detected by color flow Doppler. There is no evidence of a patent foramen ovale. No ventricular septal defect is seen or detected. There is no evidence of an atrial septal defect.  LEFT VENTRICLE PLAX 2D LVIDd:         3.75 cm LVIDs:         1.82 cm LV PW:         1.05 cm LV IVS:        0.94 cm LVOT diam:     2.10 cm LV SV:         50 ml LV SV Index:   23.95 LVOT Area:     3.46 cm  RIGHT VENTRICLE             IVC RV Basal diam:  3.23 cm     IVC diam: 1.44 cm RV Mid diam:    4.81 cm RV Length:       6.81 cm RV S prime:     21.10 cm/s TAPSE (M-mode): 1.8 cm LEFT ATRIUM           Index LA diam:      3.10 cm 1.54 cm/m LA Vol (A2C): 34.1 ml 16.94 ml/m LA Vol (A4C): 46.1 ml 22.90 ml/m  AORTIC VALVE LVOT Vmax:   98.80 cm/s LVOT Vmean:  74.000 cm/s LVOT VTI:    0.192 m  AORTA Ao Root diam: 3.10 cm Ao Asc diam:  3.60 cm TRICUSPID VALVE TR Peak grad:   64.0 mmHg TR Vmax:        400.00 cm/s  SHUNTS Systemic VTI:  0.19 m Systemic Diam: 2.10 cm  Fransico Him MD Electronically signed by Fransico Him MD Signature Date/Time: 06/12/2019/3:15:41 PM    Final    VAS [place] LOWER EXTREMITY VENOUS (DVT)  Result Date: 06/09/2019  Lower Venous Study Indications: Swelling.  Risk Factors: Cancer History of metastatic breast cancer. Comparison Study: No prior study on file Performing Technologist: Sharion Dove RVS  Examination Guidelines: A complete evaluation includes B-mode imaging, spectral Doppler, color Doppler, and power Doppler as needed of all accessible portions of each vessel. Bilateral testing is considered an integral part of a complete examination. Limited examinations for reoccurring indications may be performed as noted.  +---------+---------------+---------+-----------+----------+--------------+ RIGHT    CompressibilityPhasicitySpontaneityPropertiesThrombus Aging +---------+---------------+---------+-----------+----------+--------------+ CFV      Partial        Yes      Yes                  Acute          +---------+---------------+---------+-----------+----------+--------------+  SFJ      Full                                                        +---------+---------------+---------+-----------+----------+--------------+ FV Prox  None                                         Acute          +---------+---------------+---------+-----------+----------+--------------+ FV Mid   None                                         Acute           +---------+---------------+---------+-----------+----------+--------------+ FV DistalNone                                         Acute          +---------+---------------+---------+-----------+----------+--------------+ PFV      Full                                                        +---------+---------------+---------+-----------+----------+--------------+ POP      None           No       No                   Acute          +---------+---------------+---------+-----------+----------+--------------+ PTV      None                                         Acute          +---------+---------------+---------+-----------+----------+--------------+ PERO     None                                         Acute          +---------+---------------+---------+-----------+----------+--------------+ EIV                     Yes      Yes                                 +---------+---------------+---------+-----------+----------+--------------+   +----+---------------+---------+-----------+----------+--------------+ LEFTCompressibilityPhasicitySpontaneityPropertiesThrombus Aging +----+---------------+---------+-----------+----------+--------------+ CFV Full           Yes      Yes                                 +----+---------------+---------+-----------+----------+--------------+     Summary: Right: Findings consistent with acute deep vein thrombosis involving the right common femoral vein, right femoral  vein, right popliteal vein, right posterior tibial veins, and right peroneal veins. Left: No evidence of common femoral vein obstruction.  *See table(s) above for measurements and observations. Electronically signed by Harold Barban MD on 06/09/2019 at 8:27:06 AM.    Final     ASSESSMENT: 63 y.o. [place] woman    (1) status post right lumpectomy and sentinel lymph node sampling October 2005 for a 0.6 cm invasive ductal carcinoma involving one out of 2 sentinel  lymph nodes sampled, grade 3, triple-negative, treated adjuvantly with doxorubicin and cyclophosphamide 4 followed by weekly paclitaxel 7, followed by adjuvant radiation   RECURRENT DISEASE: (2) status post right breast upper outer quadrant biopsy and right axillary lymph node biopsy 10/01/2016, both positive for a T2 N1, stage IIIB invasive ductal carcinoma, triple negative, with an MIB-1 of 50-70%   (3) status post right modified radical mastectomy 11/05/2016 showing a pT2 pN1, stage IIIB invasive ductal carcinoma, grade 3, triple negative, with negative margins   (4) not a candidate for radiation given prior history   (5) adjuvant chemotherapy consisting of carboplatin and gemcitabine given days 1 and 8 of each 21 day cycle, for 6 cycles, starting 11/20/2016, completed 03/11/2017             (a) day 8 cycle 2 omitted because of neutropenia; Neupogen/Neulasta added   (5) HIV positivity: under care of Id Lucianne [place])   (6) Genetic testing 06/17/2017:  no pathogenic mutations. Genes tested: APC, ATM, AXIN2, BARD1, BLM, BMPR1A, BRCA1, BRCA2, BRIP1, CDH1, CDK4, CDKN2A (p14ARF), CDKN2A (p16INK4a), CEBPA, CHEK2, CTNNA1, DICER1, EPCAM*, GATA2, GREM1*, HRAS, KIT, MEN1, MLH1, MSH2, MSH3, MSH6, MUTYH, NBN, NF1, PALB2, PDGFRA, PMS2, POLD1, POLE, PTEN, RAD50, RAD51C, RAD51D, RUNX1, SDHB, SDHC, SDHD, SMAD4, SMARCA4, STK11, TERC, TERT, TP53, TSC1, TSC2, VHL. The following genes were evaluated for sequence changes only: HOXB13*, NTHL1*, SDHA.              (a) A variant of uncertain significance (VUS) in a gene called NTHL1 was also noted. c.[id number]>A (p.[id number])   METASTATIC DISEASE: November 2019 (1) Patient seen in urgent care and ultimately ED on 04/14/2018 for shortness of breath, chest xray demonstrated Right pleural effusion.   (a) right thoracenteses on 10/21 and 11/4 results show atypical cells, non diagnostic (b) CT chest 04/22/2018 shows re-accumulation of fluid, and right pleural nodularity. (c) PET  scan on 05/01/2018 shows hypermetabolic pleural based metastases, right CP angle nodal metastases, no evidence of malignancy in abdomen and pelvis. (d) bronchoscopy with biopsy by Dr. Lamonte Sakai and BAL on 05/15/2018 was non diagnostic             (e) VAT biopsy of the right pleura 05/30/2018 confirms carcinoma, triple negative             (f) [facility] shows PD-L1 positive (1% in [facility]); otherwise microsatellite stable, TMB low (5/Mb), no PIK3 mutations; other mutations suggest sensitivity to MTOR inhibitors and several TKIs   (2) Atezolizumab started 07/15/2018 given every other week             (a) PET 08/13/2018 shows tumor Right hemithorax (new baseline study)             (b) Atezo changed to Q4w starting with 11/07/2018 dose             (c) PET 11/28/2018 documents progression in the right lung and pleural area as well as lymph nodes, and a T1 skeletal metastasis             (  d) atezolizumab discontinued after 12/05/2018 dose   (3) eribulin day 1 and 8 of every 21 day cycle started 12/19/2018             (a) changed to every other week with Neulsta support due to neutropenia causing treatment delays and her h/o HIV (delays due to insurance denial of onpro after 02/20/2019 dose)             (b) PET scan on 03/11/2019 showed stable to improved measurable disease, with multiple new bone lesions             (c) zoledronate given every 12 weeks starting on 03/25/2019   (4) brain MRI 04/28/2019 documents multiple intracranial metastases             (a) whole brain radiation 04/30/2019 - 05/13/2019, 30 Gy in 10 fractions  (5) right lower extremity DVT diagnosed December 2020     PLAN:  Delilah is back on BiPAP and appears to be short of breath.  She is not really eating and drinking.  I am uncertain if she will survive this hospitalization.  She has widely disseminated breast cancer and has not received any systemic treatment since 04/17/2019.  Given her continued decline in performance status, I am  uncertain that she would be able to tolerate any additional systemic therapy for her cancer.  I would again recommend consideration of residential hospice.  I know that placement at SNF is being pursued, but I am unsure if they will be able to meet her needs.  We will continue to follow with you.     LOS: 21 days   Mikey Bussing, DNP, AGPCNP-BC, AOCNP 06/24/19

## 2019-06-25 ENCOUNTER — Ambulatory Visit: Payer: POS | Admitting: Radiation Oncology

## 2019-06-25 LAB — CBC WITH DIFFERENTIAL/PLATELET
Abs Immature Granulocytes: 0.3 10*3/uL — ABNORMAL HIGH (ref 0.00–0.07)
Basophils Absolute: 0 10*3/uL (ref 0.0–0.1)
Basophils Relative: 0 %
Eosinophils Absolute: 0.1 10*3/uL (ref 0.0–0.5)
Eosinophils Relative: 1 %
HCT: 27.5 % — ABNORMAL LOW (ref 36.0–46.0)
Hemoglobin: 8.4 g/dL — ABNORMAL LOW (ref 12.0–15.0)
Lymphocytes Relative: 4 %
Lymphs Abs: 0.4 10*3/uL — ABNORMAL LOW (ref 0.7–4.0)
MCH: 31.8 pg (ref 26.0–34.0)
MCHC: 30.5 g/dL (ref 30.0–36.0)
MCV: 104.2 fL — ABNORMAL HIGH (ref 80.0–100.0)
Metamyelocytes Relative: 2 %
Monocytes Absolute: 1.2 10*3/uL — ABNORMAL HIGH (ref 0.1–1.0)
Monocytes Relative: 11 %
Myelocytes: 1 %
Neutro Abs: 8.8 10*3/uL — ABNORMAL HIGH (ref 1.7–7.7)
Neutrophils Relative %: 81 %
Platelets: 237 10*3/uL (ref 150–400)
RBC: 2.64 MIL/uL — ABNORMAL LOW (ref 3.87–5.11)
RDW: 24.7 % — ABNORMAL HIGH (ref 11.5–15.5)
WBC: 10.9 10*3/uL — ABNORMAL HIGH (ref 4.0–10.5)
nRBC: 1.1 % — ABNORMAL HIGH (ref 0.0–0.2)
nRBC: 2 /100 WBC — ABNORMAL HIGH

## 2019-06-25 LAB — RENAL FUNCTION PANEL
Albumin: 1.8 g/dL — ABNORMAL LOW (ref 3.5–5.0)
Anion gap: 6 (ref 5–15)
BUN: 6 mg/dL — ABNORMAL LOW (ref 8–23)
CO2: 28 mmol/L (ref 22–32)
Calcium: 8 mg/dL — ABNORMAL LOW (ref 8.9–10.3)
Chloride: 108 mmol/L (ref 98–111)
Creatinine, Ser: 0.92 mg/dL (ref 0.44–1.00)
GFR calc Af Amer: >60 mL/min (ref >60)
GFR calc non Af Amer: >60 mL/min (ref >60)
Glucose, Bld: 106 mg/dL — ABNORMAL HIGH (ref 70–99)
Phosphorus: 1.4 mg/dL — ABNORMAL LOW (ref 2.5–4.6)
Potassium: 3.1 mmol/L — ABNORMAL LOW (ref 3.5–5.1)
Sodium: 142 mmol/L (ref 135–145)

## 2019-06-25 NOTE — Progress Notes (Addendum)
Hospitalist daily note   Kayla Price [id number] DOB: Apr 17, 1956 DOA: 06/01/2019  PCP: Lauree Chandler, NP  Narrative:   63 year old black female metastatic breast cancer status post lumpectomy 03/2004 with radical mastectomy 2018-status post Keytruda, Tecentriq with Neulasta--and widely metastatic breast cancer with poor performance Metastatic disease with visual deficits based on intracranial spread and vasogenic edema HIV followed by Dr. 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 secondary to cefepime as per neurology she had EEG on 12/24 on 12/27 showing diffuse encephalopathy initially and then subsequently nonspecific dysfunction She is off Bipap but reamins intermittently confused at times dependant on her CO2 levels Acute hypercarbic respiratory failure Has been intermittently on BiPAP since 12/27 and has needed it since this morning I have broached the topic of intermittent bipap, this not being comfortable for the patient and intermittent confusion--I have asked her what she would want to do if she gets worse--she gives me conflicting info and tells me that her goal is to "get better and go home and get chemo" but when I presented to her the need for  intermittent BiPAP and possible rehospitalizations she is not clear 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 Wynelle Cleveland discussed with Dr. 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 Status post whole brain radiation 11/20 chemo and mastectomies  Called and discussed with daughter [phone] understands the patient is sick and asks about options regarding care including oxygen at a facility etc. she will reach out to Dr. Jana Hakim and decide on goals I spoke with Dr. Lucia Gaskins as well regarding goals of care and follow-up discussion with family lovenox  DNR Inpatient not ready for d/c yet  Subjective: More orientable not in any distress came off BiPAP this morning for breakfast and took her multivitamin seems a little bit more coherent  Consultants:   CCM Procedures:   Multiple Antimicrobials:   Completed   Objective: Vitals:   06/25/19 0253 06/25/19 0258 06/25/19 0300 06/25/19 0434  BP:    114/80  Pulse: 84 77 81 85  Resp: (!) 28 12 14 17   Temp:    98 F (36.7 C)  TempSrc:    Oral  SpO2: 94% 96% 96% 100%  Weight:    (!) 137.4 kg  Height:        Intake/Output Summary (Last 24 hours) at 06/25/2019 1114 Last data filed at 06/25/2019 0948 Gross per 24 hour  Intake -  Output 2400 ml  Net -2400 ml   Filed Weights   06/23/19 0430 06/24/19 0512 06/25/19 0434  Weight: (!) 137 kg (!) 137.4 kg (!) 137.4 kg    Examination: More awake alert no distress EOMI NCAT Very frail  cachectic looking African-American female Mucosa dry Chest clear Abdomen soft nontender no rebound No lower extremity edema  Scheduled Meds: . Chlorhexidine Gluconate Cloth  6 each Topical Daily  . darunavir-cobicistat  1 tablet Oral QHS  . dolutegravir  50 mg Oral QHS   And  . rilpivirine  25 mg Oral QHS  . enoxaparin (LOVENOX) injection  50 mg Subcutaneous Q12H  . feeding supplement (ENSURE ENLIVE)  237 mL Oral TID BM  . levothyroxine  100 mcg Intravenous Daily  . multivitamin with minerals  1 tablet Oral Daily   Continuous Infusions: . sodium chloride Stopped (06/22/19 0036)  . dextrose 100 mL/hr at 06/25/19 0236  . levETIRAcetam 250 mg (06/25/19 0951)     LOS: 22 days   Time spent: [place], MD [facility]

## 2019-06-25 NOTE — Progress Notes (Signed)
Pt placed back on bipap after a 3.5 hour break on 3 liter [place].

## 2019-06-25 NOTE — Progress Notes (Signed)
HEMATOLOGY-ONCOLOGY PROGRESS NOTE  SUBJECTIVE: Amiya recognized me but focused only on getting the back of her bed raised. I did that for her and put the controls in her reach. She persevered handling the controls and trying to get the bed adjusted even after I readjusted it for her. She tells me she is not getting the support she needs. She says she and her daughter are not happy with the care she is receiving at [facility]. No family in room  Oncology History  Malignant neoplasm of upper-outer quadrant of right breast in female, estrogen receptor negative ([place])  03/2004 Surgery   Right lumpectomy and SLNB for IDC, 0.5cm, 1/2 + SLN, grade 3, ER-, PR-, HER-2 -.  Treated with Doxorubicin and Cyclophosphamide x 4, followed by weekly Paclitaxel x 7 and adjuvant radiation.    10/01/2016 Initial Biopsy   Right breast upper outer quadrant biopsy: IDC, DCIS, triple negative, grade 3. Lymph node: Positive   11/05/2016 Surgery   Right modified radical mastectomy: IDC, grade 3, 2.6 cm, +LVI, triple negative, margins negative, 2/10 LN + metastases.  T2, N1a   11/20/2016 - 03/11/2017 Adjuvant Chemotherapy   Gemcitabine/Carboplatin given on days 1 and 8 on a 21 day cycle x 6 cycles   07/15/2018 - 07/15/2018 Chemotherapy   The patient had pembrolizumab (KEYTRUDA) 200 mg in sodium chloride 0.9 % 50 mL chemo infusion, 200 mg, Intravenous, Once, 0 of 6 cycles  for chemotherapy treatment.    07/15/2018 - 01/01/2019 Chemotherapy   The patient had atezolizumab (TECENTRIQ) 840 mg in sodium chloride 0.9 % 250 mL chemo infusion, 840 mg, Intravenous, Once, 6 of 8 cycles Dose modification: 1,680 mg (original dose 840 mg, Cycle 4, Reason: Provider Judgment), 840 mg (original dose 840 mg, Cycle 4, Reason: Provider Judgment) Administration: 840 mg (07/15/2018), 840 mg (08/01/2018), 840 mg (08/15/2018), 840 mg (08/29/2018), 840 mg (09/12/2018), 840 mg (09/26/2018), 840 mg (10/10/2018), 840 mg (10/24/2018), 1,680 mg (11/07/2018), 1,680 mg  (12/05/2018)  for chemotherapy treatment.    12/19/2018 -  Chemotherapy   The patient had pegfilgrastim (NEULASTA) injection 6 mg, 6 mg, Subcutaneous, Once, 1 of 1 cycle Administration: 6 mg (03/28/2019) pegfilgrastim (NEULASTA ONPRO KIT) injection 6 mg, 6 mg, Subcutaneous, Once, 4 of 8 cycles Administration: 6 mg (01/09/2019), 6 mg (01/23/2019), 6 mg (02/06/2019), 6 mg (02/20/2019), 6 mg (04/03/2019), 6 mg (04/17/2019) eriBULin mesylate (HALAVEN) 2.75 mg in sodium chloride 0.9 % 100 mL chemo infusion, 1.2 mg/m2 = 2.75 mg (100 % of original dose 1.2 mg/m2), Intravenous,  Once, 5 of 9 cycles Dose modification: 1.2 mg/m2 (original dose 1.2 mg/m2, Cycle 1, Reason: Provider Judgment) Administration: 2.75 mg (12/19/2018), 2.75 mg (01/09/2019), 2.75 mg (01/23/2019), 2.75 mg (02/06/2019), 2.75 mg (02/20/2019), 2.75 mg (03/06/2019), 2.75 mg (03/25/2019), 2.75 mg (04/03/2019), 2.75 mg (04/17/2019)  for chemotherapy treatment.    01/02/2019 - 01/02/2019 Chemotherapy   The patient had atezolizumab (TECENTRIQ) 1,200 mg in sodium chloride 0.9 % 250 mL chemo infusion, 1,200 mg, Intravenous, Once, 0 of 6 cycles  for chemotherapy treatment.    Metastatic breast cancer ([place])  11/05/2016 Initial Diagnosis   Recurrent cancer of right breast ([place])   07/15/2018 - 01/01/2019 Chemotherapy   The patient had atezolizumab (TECENTRIQ) 840 mg in sodium chloride 0.9 % 250 mL chemo infusion, 840 mg, Intravenous, Once, 5 of 8 cycles Dose modification: 1,680 mg (original dose 840 mg, Cycle 4, Reason: Provider Judgment), 840 mg (original dose 840 mg, Cycle 4, Reason: Provider Judgment) Administration: 840 mg (07/15/2018), 840 mg (  08/01/2018), 840 mg (08/15/2018), 840 mg (08/29/2018), 840 mg (09/12/2018), 840 mg (09/26/2018), 840 mg (10/10/2018), 840 mg (10/24/2018), 1,680 mg (11/07/2018)  for chemotherapy treatment.    12/19/2018 -  Chemotherapy   The patient had pegfilgrastim (NEULASTA) injection 6 mg, 6 mg, Subcutaneous, Once, 1 of 1  cycle Administration: 6 mg (03/28/2019) pegfilgrastim (NEULASTA ONPRO KIT) injection 6 mg, 6 mg, Subcutaneous, Once, 4 of 8 cycles Administration: 6 mg (01/09/2019), 6 mg (01/23/2019), 6 mg (02/06/2019), 6 mg (02/20/2019), 6 mg (04/03/2019), 6 mg (04/17/2019) eriBULin mesylate (HALAVEN) 2.75 mg in sodium chloride 0.9 % 100 mL chemo infusion, 1.2 mg/m2 = 2.75 mg (100 % of original dose 1.2 mg/m2), Intravenous,  Once, 5 of 9 cycles Dose modification: 1.2 mg/m2 (original dose 1.2 mg/m2, Cycle 1, Reason: Provider Judgment) Administration: 2.75 mg (12/19/2018), 2.75 mg (01/09/2019), 2.75 mg (01/23/2019), 2.75 mg (02/06/2019), 2.75 mg (02/20/2019), 2.75 mg (03/06/2019), 2.75 mg (03/25/2019), 2.75 mg (04/03/2019), 2.75 mg (04/17/2019)  for chemotherapy treatment.      PHYSICAL EXAMINATION: ECOG PERFORMANCE STATUS: 4 - Bedbound  Vitals:   06/25/19 0300 06/25/19 0434  BP:  114/80  Pulse: 81 85  Resp: 14 17  Temp:  98 F (36.7 C)  SpO2: 96% 100%   Filed Weights   06/23/19 0430 06/24/19 0512 06/25/19 0434  Weight: (!) 302 lb (137 kg) (!) 303 lb (137.4 kg) (!) 303 lb (137.4 kg)    Intake/Output from previous day: 12/30 0701 - 12/31 0700 In: -  Out: 2050 [Urine:2050]  Lungs no rales or rhonchi--auscultated anterolaterally Heart regular rate and rhythm Abd soft, nontender, positive bowel sounds Neuro: nonfocal, flat affect Breasts: Deferred  LABORATORY DATA:  I have reviewed the data as listed CMP Latest Ref Rng & Units 06/25/2019 06/24/2019 06/23/2019  Glucose 70 - 99 mg/dL 106(H) 138(H) 108(H)  BUN 8 - 23 mg/dL 6(L) 11 12  Creatinine 0.44 - 1.00 mg/dL 0.92 0.92 0.97  Sodium 135 - 145 mmol/L 142 139 144  Potassium 3.5 - 5.1 mmol/L 3.1(L) 3.4(L) 3.5  Chloride 98 - 111 mmol/L 108 108 112(H)  CO2 22 - 32 mmol/L _0 Calcium 8.9 - 10.3 mg/dL 8.0(L) 7.9(L) 7.8(L)  Total Protein 6.5 - 8.1 g/dL - - -  Total Bilirubin 0.3 - 1.2 mg/dL - - -  Alkaline Phos 38 - 126 U/L - - -  AST 15 - 41 U/L - -  -  ALT 0 - 44 U/L - - -    Lab Results  Component Value Date   WBC 10.9 (H) 06/25/2019   HGB 8.4 (L) 06/25/2019   HCT 27.5 (L) 06/25/2019   MCV 104.2 (H) 06/25/2019   PLT 237 06/25/2019   NEUTROABS 8.8 (H) 06/25/2019    EEG  Result Date: 06/21/2019 Greta Doom, MD     06/21/2019 12:38 PM Patient Name: BROOKE STEINHILBER MRN: [id number] EEG Attending: Roland Rack Referring Physician/Provider: Roland Rack Date: 06/21/2019 Duration: 24 minutes Patient history: 63 yo F with multiple intracranial metastasis with encephalopathy. Recent EEG with triphasic waves. Level of alertness: Encephalopathic AEDs during EEG study: Keppra 560m BID Technical aspects: This EEG study was done with scalp electrodes positioned according to the 10-20 International system of electrode placement. Electrical activity was acquired at a sampling rate of _1  and reviewed with a high frequency filter of _2  and a low frequency filter of _3 . EEG data were recorded continuously and digitally stored. BACKGROUND ACTIVITY:  The background consist predominately of generalized irregular slow activity  with some frontocentral predominant beta range activity.  There are poorly formed sleep structures at times.  No definite posterior dominant rhythm was seen, however following stimulation there was briefly a posteriorly predominant rhythm of 8 Hz but this was very briefly sustained. EPILEPTIFORM ACTIVITY: Interictal epileptiform activity: None Ictal Activity: None OTHER EVENTS: None SLEEP RECORDINGS: Only awake and drowsy states were recorded. Drowsiness was characterized by attenuation of the posterior background rhythm. ACTIVATION PROCEDURES: Hyperventilation and photic stimulation were not performed. IMPRESSION: This EEG is consistent with a generalized nonspecific cerebral dysfunction.  There has been significant improvement since the previous recording and that no triphasic waves are seen in the study.  There was no seizure or evidence of seizure predisposition recorded on this study. Please note that lack of epileptiform activity on EEG does not preclude the possibility of epilepsy.  Roland Rack, MD [facility] [phone] If 7pm- 7am, please page neurology on call as listed in [place].   CT ANGIO CHEST PE W OR WO CONTRAST  Result Date: 06/09/2019 CLINICAL DATA:  Shortness of breath.  Metastatic breast cancer. EXAM: CT ANGIOGRAPHY CHEST WITH CONTRAST TECHNIQUE: Multidetector CT imaging of the chest was performed using the standard protocol during bolus administration of intravenous contrast. Multiplanar CT image reconstructions and MIPs were obtained to evaluate the vascular anatomy. CONTRAST:  12m OMNIPAQUE IOHEXOL 350 MG/ML SOLN COMPARISON:  PET CT 03/11/2019.  Chest CT 04/22/2018 FINDINGS: Cardiovascular: No filling defects in the pulmonary arteries to suggest pulmonary emboli. Tortuous, ectatic aorta with maximum diameter 3.7 cm. No dissection. Heart is enlarged. Mediastinum/Nodes: Extensive adenopathy in the mediastinum. Surgical clips in the right axilla. This is unchanged since recent PET CT. Lungs/Pleura: Extensive pleural nodularity/disease throughout right hemithorax, similar to recent PET CT. Right middle lobe mass is unchanged. Central necrotic portion again noted, unchanged. Scattered ground-glass airspace disease noted within both lungs concerning for pneumonia. Upper Abdomen: Gastrohepatic ligament lymph nodes again noted, unchanged. No acute findings. Musculoskeletal: Prior right mastectomy. Right chest wall Port-A-Cath remains in place, unchanged. Sclerotic lesion within the T11 vertebral body is stable. Review of the MIP images confirms the above findings. IMPRESSION: No evidence of pulmonary embolus. Extensive mediastinal adenopathy, right pleural metastatic disease, and right middle lobe mass are all stable since prior PET CT. Worsening ground-glass airspace disease  throughout both lungs concerning for pneumonia. Cardiomegaly. Tortuous, ectatic thoracic aorta. Electronically Signed   By: KRolm BaptiseM.D.   On: 06/09/2019 17:44   MR BRAIN W WO CONTRAST  Result Date: 06/18/2019 CLINICAL DATA:  Encephalopathy. Metastatic breast cancer. Known brain metastases. EXAM: MRI HEAD WITHOUT AND WITH CONTRAST TECHNIQUE: Multiplanar, multiecho pulse sequences of the brain and surrounding structures were obtained without and with intravenous contrast. CONTRAST:  967mGADAVIST GADOBUTROL 1 MMOL/ML IV SOLN COMPARISON:  MR head without and with contrast 04/28/2019 FINDINGS: Brain: The right occipital lesion has decreased somewhat. There is mostly peripheral enhancement with a nodular component posteriorly. The lesion now measures 2.4 x 2.6 x 2.1 cm. The lesion previously measured 3.4 x 3.1 x 2.7 cm in the same dimensions. A left occipital lesion previously measured 13 x 11 mm. It now measures 13 x 9 mm. A more lateral lesion in the left occipital lobe has decreased from 9 mm to 6.5 mm. A lesion in the lateral left temporal lobe has decreased from 6.53.5 mm on image 23 of series 10. A medial right temporal lobe lesion on image 25 has decreased from 7.5 mm to 6 mm. A lesion in the  left paramedian superior cerebellar vermis now measures 4 mm. Previously measured 7 mm. A lesion in the left paramedian pons previously measured 4.5 mm. It is now punctate in size. A lesion in the anterior left frontal lobe white matter on image 36 as decreased from 7.5 mm to 3.5 mm. A punctate lesion in the left corona radiata previously measured 4 mm. Punctate lesion is again noted in the posterior left frontal lobe on image 45. No new enhancing lesions are present. Vasogenic edema is most prominent about the occipital lobe lesions. This has decreased since the prior exam. No acute infarct or hemorrhage is present. Ventricles are of normal size. No significant extraaxial fluid collection is present. Vascular: Flow  is present in the major intracranial arteries. Skull and upper cervical spine: Extensive marrow signal changes in the calvarium ir consistent with osseous metastases. Heterogeneous marrow signal is present in the upper cervical spine. Sinuses/Orbits: Minimal fluid is present in the left maxillary sinus. A fluid level is present in the left sphenoid sinus. Left ethmoid air cells are partially opacified. The paranasal sinuses and mastoid air cells are otherwise clear. IMPRESSION: 1. Decrease in size of multiple brain metastases identified on the prior exam. No new lesions are present. 2. Decreased vasogenic edema, still predominantly occipital lobes. 3. Diffuse marrow signal changes in the skull concerning for metastatic disease. 4. Left maxillary and sphenoid sinus disease. Electronically Signed   By: San Morelle M.D.   On: 06/18/2019 18:44   US RENAL  Result Date: 05/28/2019 CLINICAL DATA:  Acute kidney injury. EXAM: RENAL / URINARY TRACT ULTRASOUND COMPLETE COMPARISON:  PET CT scan 03/11/2019. FINDINGS: Right Kidney: Renal measurements: 9.9 x 4.3 x 5.0 cm = volume: 109.7 mL . Echogenicity within normal limits. No mass or hydronephrosis visualized. Left Kidney: Renal measurements: 9.6 x 5.4 x 4.9 cm = volume: Is 134.3. mL. Echogenicity within normal limits. No mass or hydronephrosis visualized. Bladder: Appears normal for degree of bladder distention. Other: Small right pleural effusion and gallstones are noted as seen on the prior exam. IMPRESSION: Negative for hydronephrosis.  Normal appearing kidneys. Right pleural effusion. Gallstones. Electronically Signed   By: Inge Rise M.D.   On: 05/28/2019 07:15   DG CHEST PORT 1 VIEW  Result Date: 06/24/2019 CLINICAL DATA:  History of breast cancer. Shortness of breath. Pneumonia. EXAM: PORTABLE CHEST 1 VIEW COMPARISON:  Single-view of the chest 06/12/2019, 06/18/2019 and 06/19/2019. FINDINGS: Right Port-A-Cath remains in place. Extensive airspace  disease in the right lung persist but aeration in the right upper lung zone is mildly improved since the most recent study. Airspace disease in the left lung has markedly improved since the most recent examination. There is some residual atelectasis in the left mid lung zone. Cardiac silhouette is largely obscured. Surgical clips in the right axilla and right IJ approach Port-A-Cath are noted. IMPRESSION: Improved aeration in the left mid lung zone with residual atelectasis noted. Large right pleural effusion and airspace disease persist although aeration in the right upper lung zone appears mildly improved since the most recent study. Electronically Signed   By: Inge Rise M.D.   On: 06/24/2019 13:33   DG CHEST PORT 1 VIEW  Result Date: 06/19/2019 CLINICAL DATA:  Unresponsive and agitated. History metastatic breast carcinoma with known extensive right-sided pleural and parenchymal metastatic disease. EXAM: PORTABLE CHEST 1 VIEW COMPARISON:  06/18/2019 FINDINGS: Stable appearance of Port-A-Cath. Stable volume loss and poor aeration of the right lung secondary to extensive pleural tumor and associated  loculated right pleural fluid. Lower volume of the left lung with persistent dense infiltrate in the left mid lung and probable component of infiltrate at the left lung base. No pneumothorax. No significant left-sided pleural fluid identified. IMPRESSION: Stable extensive pleural and parenchymal disease in the right chest with associated loculated right pleural fluid. Stable dense infiltrate in the left mid lung and probable component of infiltrate at the left lung base. Electronically Signed   By: Aletta Edouard M.D.   On: 06/19/2019 14:37   DG CHEST PORT 1 VIEW  Result Date: 06/18/2019 CLINICAL DATA:  Hypoxia EXAM: PORTABLE CHEST 1 VIEW COMPARISON:  06/13/2019 FINDINGS: Right Port-A-Cath remains in place, unchanged. Cardiomegaly. Diffuse right lung and patchy left lung airspace disease again noted,  not significantly changed. Possible right effusion. No acute bony abnormality. IMPRESSION: No significant change since prior study. Electronically Signed   By: Rolm Baptise M.D.   On: 06/18/2019 09:17   DG CHEST PORT 1 VIEW  Result Date: 06/13/2019 CLINICAL DATA:  Hypoxia. EXAM: PORTABLE CHEST 1 VIEW COMPARISON:  June 12, 2019 FINDINGS: The right Port-A-Cath is stable. No pneumothorax. Infiltrate in the left mid lung is more focal in the interval. Infiltrate in the right lung, particularly in the lower right lung is similar in the interval. The cardiomediastinal silhouette is stable. No other interval changes. Stable cardiomediastinal silhouette. IMPRESSION: 1. Stable infiltrate on the right, particularly in the right base. More focal infiltrate in the left mid lung compared to the previous study. Stable right Port-A-Cath. Electronically Signed   By: Dorise Bullion III M.D   On: 06/13/2019 15:45   DG Chest Port 1 View  Result Date: 06/12/2019 CLINICAL DATA:  Hypoxia. Metastatic breast cancer. EXAM: PORTABLE CHEST 1 VIEW COMPARISON:  CTA chest 06/09/2019. One-view chest x-ray 06/08/2019. FINDINGS: The heart size is normal. The heart is enlarged. Right IJ Port-A-Cath is stable. A right pleural effusion has slightly increased. Right greater than left airspace opacities have progressed. Surgical clips are noted in the right axilla. IMPRESSION: 1. Progressive right greater than left airspace disease compatible with pneumonia. 2. Slight increase in right pleural effusion. 3. Stable right IJ Port-A-Cath. Electronically Signed   By: San Morelle M.D.   On: 06/12/2019 09:29   DG CHEST PORT 1 VIEW  Result Date: 06/08/2019 CLINICAL DATA:  Hypoxia EXAM: PORTABLE CHEST 1 VIEW COMPARISON:  07/10/2018 FINDINGS: Right Port-A-Cath in place with the tip in the SVC. Airspace disease in the right lower lung concerning for pneumonia. No confluent opacity on the left. Heart is borderline in size. Possible  small right effusion. No acute bony abnormality. IMPRESSION: Right lower lung airspace disease concerning for pneumonia. Small right effusion suspected. Electronically Signed   By: Rolm Baptise M.D.   On: 06/08/2019 18:55   EEG adult  Result Date: 06/18/2019 Lora Havens, MD     06/18/2019  8:55 PM Patient Name: ALAYSIAH BROWDER MRN: [id number] Epilepsy Attending: Lora Havens Referring Physician/Provider: Dr Debbe Odea Date: 06/18/2019 Duration: 24.56 mins Patient history: 63yo F with HIV, brain mets and now with ams. EEG to evaluate for seizure Level of alertness: awake AEDs during EEG study: None Technical aspects: This EEG study was done with scalp electrodes positioned according to the 10-20 International system of electrode placement. Electrical activity was acquired at a sampling rate of _0  and reviewed with a high frequency filter of _1  and a low frequency filter of _2 . EEG data were recorded continuously and digitally stored. DESCRIPTION: During awake  state, no clear posterior dominant rhythm. EEG also showed continuous generalized  3-_0  theta-delta slowing. Triphasic waves, generalized, maximal bifrontal, at _1  were also noted. Hyperventilation and photic stimulation were not performed. ABNORMALITY - Continuous slow, generalized - Triphasic waves, generalized IMPRESSION: This study is  Suggestive of severe diffuse encephalopathy, non specific to etiology but could be secondary to toxic-metabolic causes, cepefime toxicity. No seizures or definite epileptiform discharges were seen throughout the recording. Lora Havens   ECHOCARDIOGRAM COMPLETE  Result Date: 06/12/2019   ECHOCARDIOGRAM REPORT   Patient Name:   LAMIJA BESSE Date of Exam: 06/12/2019 Medical Rec #:  [id number]         Height:       67.0 in Accession #:    [id number]        Weight:       198.0 lb Date of Birth:  [birth year]         BSA:          2.01 m Patient Age:    33 years          BP:           128/76  mmHg Patient Gender: F                 HR:           108 bpm. Exam Location:  Inpatient Procedure: 2D Echo, Color Doppler and Cardiac Doppler Indications:    R06.9 DOE  History:        Patient has no prior history of Echocardiogram examinations.                 Risk Factors:Hypertension and Dyslipidemia.  Sonographer:    Raquel Sarna Senior RDCS Referring Phys: [place]  1. Left ventricular ejection fraction, by visual estimation, is 65 to 70%. The left ventricle has normal function. There is no left ventricular hypertrophy.  2. The left ventricle has no regional wall motion abnormalities.  3. Global right ventricle has normal systolic function.The right ventricular size is mildly enlarged. No increase in right ventricular wall thickness.  4. Left atrial size was normal.  5. Right atrial size was severely dilated.  6. The mitral valve is normal in structure. Mild mitral valve regurgitation. No evidence of mitral stenosis.  7. The tricuspid valve is normal in structure. Tricuspid valve regurgitation is mild.  8. The aortic valve is tricuspid. Aortic valve regurgitation is not visualized. No evidence of aortic valve sclerosis or stenosis.  9. There is severe calcifcation of the aortic valve. 10. The pulmonic valve was normal in structure. Pulmonic valve regurgitation is trivial. 11. Severely elevated pulmonary artery systolic pressure at 16SAYT. 12. The inferior vena cava is normal in size with greater than 50% respiratory variability, suggesting right atrial pressure of 3 mmHg. 13. The interatrial septum appears to be lipomatous. FINDINGS  Left Ventricle: Left ventricular ejection fraction, by visual estimation, is 65 to 70%. The left ventricle has normal function. The left ventricle has no regional wall motion abnormalities. There is no left ventricular hypertrophy. Normal left atrial pressure. Right Ventricle: The right ventricular size is mildly enlarged. No increase in right ventricular wall  thickness. Global RV systolic function is has normal systolic function. The tricuspid regurgitant velocity is 4.00 m/s, and with an assumed right atrial  pressure of 8 mmHg, the estimated right ventricular systolic pressure is severely elevated at 72.0 mmHg. Left Atrium: Left atrial size was normal in size. Right Atrium: Right  atrial size was severely dilated Pericardium: There is no evidence of pericardial effusion. Mitral Valve: The mitral valve is normal in structure. Mild mitral valve regurgitation. No evidence of mitral valve stenosis by observation. Tricuspid Valve: The tricuspid valve is normal in structure. Tricuspid valve regurgitation is mild. Aortic Valve: The aortic valve is tricuspid. . There is moderate thickening and severe calcifcation of the aortic valve. Aortic valve regurgitation is not visualized. The aortic valve is structurally normal, with no evidence of sclerosis or stenosis. There is moderate thickening of the aortic valve. There is severe calcifcation of the aortic valve. Pulmonic Valve: The pulmonic valve was normal in structure. Pulmonic valve regurgitation is trivial. Pulmonic regurgitation is trivial. Aorta: The aortic root, ascending aorta and aortic arch are all structurally normal, with no evidence of dilitation or obstruction. Venous: The inferior vena cava is normal in size with greater than 50% respiratory variability, suggesting right atrial pressure of 3 mmHg. IAS/Shunts: Increased thickness of the atrial septum sparing the fossa ovalis consistent with The interatrial septum appears to be lipomatous. No atrial level shunt detected by color flow Doppler. There is no evidence of a patent foramen ovale. No ventricular septal defect is seen or detected. There is no evidence of an atrial septal defect.  LEFT VENTRICLE PLAX 2D LVIDd:         3.75 cm LVIDs:         1.82 cm LV PW:         1.05 cm LV IVS:        0.94 cm LVOT diam:     2.10 cm LV SV:         50 ml LV SV Index:   23.95  LVOT Area:     3.46 cm  RIGHT VENTRICLE             IVC RV Basal diam:  3.23 cm     IVC diam: 1.44 cm RV Mid diam:    4.81 cm RV Length:      6.81 cm RV S prime:     21.10 cm/s TAPSE (M-mode): 1.8 cm LEFT ATRIUM           Index LA diam:      3.10 cm 1.54 cm/m LA Vol (A2C): 34.1 ml 16.94 ml/m LA Vol (A4C): 46.1 ml 22.90 ml/m  AORTIC VALVE LVOT Vmax:   98.80 cm/s LVOT Vmean:  74.000 cm/s LVOT VTI:    0.192 m  AORTA Ao Root diam: 3.10 cm Ao Asc diam:  3.60 cm TRICUSPID VALVE TR Peak grad:   64.0 mmHg TR Vmax:        400.00 cm/s  SHUNTS Systemic VTI:  0.19 m Systemic Diam: 2.10 cm  Fransico Him MD Electronically signed by Fransico Him MD Signature Date/Time: 06/12/2019/3:15:41 PM    Final    VAS [place] LOWER EXTREMITY VENOUS (DVT)  Result Date: 06/09/2019  Lower Venous Study Indications: Swelling.  Risk Factors: Cancer History of metastatic breast cancer. Comparison Study: No prior study on file Performing Technologist: Sharion Dove RVS  Examination Guidelines: A complete evaluation includes B-mode imaging, spectral Doppler, color Doppler, and power Doppler as needed of all accessible portions of each vessel. Bilateral testing is considered an integral part of a complete examination. Limited examinations for reoccurring indications may be performed as noted.  +---------+---------------+---------+-----------+----------+--------------+ RIGHT    CompressibilityPhasicitySpontaneityPropertiesThrombus Aging +---------+---------------+---------+-----------+----------+--------------+ CFV      Partial        Yes      Yes  Acute          +---------+---------------+---------+-----------+----------+--------------+ SFJ      Full                                                        +---------+---------------+---------+-----------+----------+--------------+ FV Prox  None                                         Acute           +---------+---------------+---------+-----------+----------+--------------+ FV Mid   None                                         Acute          +---------+---------------+---------+-----------+----------+--------------+ FV DistalNone                                         Acute          +---------+---------------+---------+-----------+----------+--------------+ PFV      Full                                                        +---------+---------------+---------+-----------+----------+--------------+ POP      None           No       No                   Acute          +---------+---------------+---------+-----------+----------+--------------+ PTV      None                                         Acute          +---------+---------------+---------+-----------+----------+--------------+ PERO     None                                         Acute          +---------+---------------+---------+-----------+----------+--------------+ EIV                     Yes      Yes                                 +---------+---------------+---------+-----------+----------+--------------+   +----+---------------+---------+-----------+----------+--------------+ LEFTCompressibilityPhasicitySpontaneityPropertiesThrombus Aging +----+---------------+---------+-----------+----------+--------------+ CFV Full           Yes      Yes                                 +----+---------------+---------+-----------+----------+--------------+     Summary: Right: Findings consistent with acute  deep vein thrombosis involving the right common femoral vein, right femoral vein, right popliteal vein, right posterior tibial veins, and right peroneal veins. Left: No evidence of common femoral vein obstruction.  *See table(s) above for measurements and observations. Electronically signed by Harold Barban MD on 06/09/2019 at 8:27:06 AM.    Final     ASSESSMENT: 64 y.o. [place] woman     (1) status post right lumpectomy and sentinel lymph node sampling October 2005 for a 0.6 cm invasive ductal carcinoma involving one out of 2 sentinel lymph nodes sampled, grade 3, triple-negative, treated adjuvantly with doxorubicin and cyclophosphamide 4 followed by weekly paclitaxel 7, followed by adjuvant radiation   RECURRENT DISEASE: (2) status post right breast upper outer quadrant biopsy and right axillary lymph node biopsy 10/01/2016, both positive for a T2 N1, stage IIIB invasive ductal carcinoma, triple negative, with an MIB-1 of 50-70%   (3) status post right modified radical mastectomy 11/05/2016 showing a pT2 pN1, stage IIIB invasive ductal carcinoma, grade 3, triple negative, with negative margins   (4) not a candidate for radiation given prior history   (5) adjuvant chemotherapy consisting of carboplatin and gemcitabine given days 1 and 8 of each 21 day cycle, for 6 cycles, starting 11/20/2016, completed 03/11/2017             (a) day 8 cycle 2 omitted because of neutropenia; Neupogen/Neulasta added   (5) HIV positivity: under care of [facility] [place])   (6) Genetic testing 06/17/2017:  no pathogenic mutations. Genes tested: APC, ATM, AXIN2, BARD1, BLM, BMPR1A, BRCA1, BRCA2, BRIP1, CDH1, CDK4, CDKN2A (p14ARF), CDKN2A (p16INK4a), CEBPA, CHEK2, CTNNA1, DICER1, EPCAM*, GATA2, GREM1*, HRAS, KIT, MEN1, MLH1, MSH2, MSH3, MSH6, MUTYH, NBN, NF1, PALB2, PDGFRA, PMS2, POLD1, POLE, PTEN, RAD50, RAD51C, RAD51D, RUNX1, SDHB, SDHC, SDHD, SMAD4, SMARCA4, STK11, TERC, TERT, TP53, TSC1, TSC2, VHL. The following genes were evaluated for sequence changes only: HOXB13*, NTHL1*, SDHA.              (a) A variant of uncertain significance (VUS) in a gene called NTHL1 was also noted. c.736G>A (p.Ala246Thr)   METASTATIC DISEASE: November 2019 (1) Patient seen in urgent care and ultimately ED on 04/14/2018 for shortness of breath, chest xray demonstrated Right pleural effusion.   (a) right thoracenteses on  10/21 and 11/4 results show atypical cells, non diagnostic (b) CT chest 04/22/2018 shows re-accumulation of fluid, and right pleural nodularity. (c) PET scan on 05/01/2018 shows hypermetabolic pleural based metastases, right CP angle nodal metastases, no evidence of malignancy in abdomen and pelvis. (d) bronchoscopy with biopsy by Dr. Lamonte Sakai and BAL on 05/15/2018 was non diagnostic             (e) VAT biopsy of the right pleura 05/30/2018 confirms carcinoma, triple negative             (f) [facility] shows PD-L1 positive (1% in [facility]); otherwise microsatellite stable, TMB low (5/Mb), no PIK3 mutations; other mutations suggest sensitivity to MTOR inhibitors and several TKIs   (2) Atezolizumab started 07/15/2018 given every other week             (a) PET 08/13/2018 shows tumor Right hemithorax (new baseline study)             (b) Atezo changed to Q4w starting with 11/07/2018 dose             (c) PET 11/28/2018 documents progression in the right lung and pleural area as well as lymph nodes, and a T1 skeletal  metastasis             (d) atezolizumab discontinued after 12/05/2018 dose   (3) eribulin day 1 and 8 of every 21 day cycle started 12/19/2018             (a) changed to every other week with Neulsta support due to neutropenia causing treatment delays and her h/o HIV (delays due to insurance denial of onpro after 02/20/2019 dose)             (b) PET scan on 03/11/2019 showed stable to improved measurable disease, with multiple new bone lesions             (c) zoledronate given every 12 weeks starting on 03/25/2019   (4) brain MRI 04/28/2019 documents multiple intracranial metastases             (a) whole brain radiation 04/30/2019 - 05/13/2019, 30 Gy in 10 fractions  (5) right lower extremity DVT diagnosed December 2020     PLAN:  Caroline is not actively dying. Her condition varies from day to day and she becomes more or less encephalopathic and requires more or less respiratory support.  Ideally she would be home at this time with Hospice support but her daughter does not feel she can care for her adequately at home. I discussed moving to [facility] but [place] asked if they do Rehab there. As they do not, she does not want to go there.  Given the above I think the only choice is for SNF. She has a DNR order in place and that is appropriate. If she is discharged we will make sure she has outpatient follow-up at the [facility] as appropriate.  I appreciate your help to this patient and her family.  I will be unavailable until next week. Please consult my partners as needed in my absence.   LOS: 22 days   Chauncey Cruel, DNP, AGPCNP-BC, AOCNP 06/25/19

## 2019-06-25 NOTE — Progress Notes (Signed)
Patient does not feel she needs BIPAP at this time is 100% on 3 LPM nasal cannula. No distress or WOB is noted. Patient resting comfortably. Family member at bedside. RT will continue to monitor patient and will place on BIPAP if needed.

## 2019-06-25 NOTE — Progress Notes (Signed)
Daily Progress Note   Patient Name: Kayla Price       Date: 06/25/2019 DOB: 1956/01/06  Age: 63 y.o. MRN#: [id number] Attending Physician: Nita Sells, MD Primary Care Physician: Lauree Chandler, NP Admit Date: 06/01/2019  Reason for Consultation/Follow-up: Establishing goals of care  Subjective: Patient in room on BiPAP.  She surprised me by speaking clearly and in full sentences.  "Are you respiratory therapy?"  I explained I was not.  The RN took the BiPAP off so Daylah could speak to me.    [name] said "I have to get out of this bed.  I want to go to rehab".  We talked about what improvements she needed to be able to make before she would be able to be discharged from the hospital to rehab (eat and drink to maintain sodium level without IVF and stabilize enough not to need BiPAP during the day).  [name] appeared to understand.  She asked for me to explain this to her daughter.  She gave me her daughter's number and we called her together.    Olivia Mackie was quite impressed with her mother's speech as well.  Her mother explained what needed to happen in order for her to be discharged (fairly well) and then she asked me to speak to [facility] and elaborate.   Assessment: Patient's mental status improved.  She is still incredibly frail and at high risk for acute decline.   Patient Profile/HPI:  she is a 63 year old female with past medical history of Breast cancer metastasis to the brain and right pleura status post whole brain radiation, HIV, CKD stage III and recent admission for superficial skin breakdown and ulceration who was admitted with HSV ulcerations and right lower extremity DVT who suffered complication of worsening acute hypoxic respiratory failure likely secondary to  pneumonia versus lymphangitic spread.  She suffered an acute mental status change thought to be due to cefepime toxicity.  Her mental status has now improved.    Length of Stay: 22  Current Medications: Scheduled Meds:  . Chlorhexidine Gluconate Cloth  6 each Topical Daily  . darunavir-cobicistat  1 tablet Oral QHS  . dolutegravir  50 mg Oral QHS   And  . rilpivirine  25 mg Oral QHS  . enoxaparin (LOVENOX) injection  50 mg Subcutaneous Q12H  . feeding  supplement (ENSURE ENLIVE)  237 mL Oral TID BM  . levothyroxine  100 mcg Intravenous Daily  . multivitamin with minerals  1 tablet Oral Daily    Continuous Infusions: . sodium chloride Stopped (06/22/19 0036)  . dextrose 50 mL/hr at 06/25/19 1145  . levETIRAcetam 250 mg (06/25/19 0951)    PRN Meds: sodium chloride, acetaminophen **OR** acetaminophen, calcium carbonate (dosed in mg elemental calcium), camphor-menthol **AND** hydrOXYzine, docusate sodium, HYDROcodone-acetaminophen, LORazepam, morphine injection, ondansetron **OR** ondansetron (ZOFRAN) IV, sodium chloride flush, sorbitol, zolpidem  Physical Exam        Chronically ill appearing female. Lying in bed on BiPAP Awake, alert, coherent CV rrr  resp no apparent distress once BiPAP removed.  Vital Signs: BP 105/76 (BP Location: Left Arm)   Pulse 100   Temp 97.8 F (36.6 C) (Axillary)   Resp 18   Ht 5\' 7"  (1.702 m)   Wt (!) 137.4 kg Comment: likely inacurate  LMP 11/06/2003   SpO2 98%   BMI 47.46 kg/m  SpO2: SpO2: 98 % O2 Device: O2 Device: Nasal Cannula O2 Flow Rate: O2 Flow Rate (L/min): 3 L/min  Intake/output summary:   Intake/Output Summary (Last 24 hours) at 06/25/2019 1911 Last data filed at 06/25/2019 6503 Gross per 24 hour  Intake 240 ml  Output 1850 ml  Net -1610 ml   LBM: Last BM Date: 06/23/19 Baseline Weight: Weight: 90.7 kg Most recent weight: Weight: (!) 137.4 kg(likely inacurate)       Palliative Assessment/Data: 20%    Flowsheet  Rows     Most Recent Value  Intake Tab  Referral Department  -- [hematology]  Unit at Time of Referral  Other (Comment) [renal]  Palliative Care Primary Diagnosis  Cancer  Date Notified  06/12/19  Palliative Care Type  New Palliative care  Reason for referral  Clarify Goals of Care  Date of Admission  06/01/19  Date first seen by Palliative Care  06/13/19  # of days Palliative referral response time  1 Day(s)  # of days IP prior to Palliative referral  11  Clinical Assessment  Psychosocial & Spiritual Assessment  Palliative Care Outcomes      Patient Active Problem List   Diagnosis Date Noted  . Palliative care encounter   . Right lower lobe pneumonia 06/09/2019  . Pressure injury of skin 06/09/2019  . Acute hypoxemic respiratory failure ([place]) 06/09/2019  . Right leg DVT ([place]) 06/09/2019  . Edema of right lower extremity 06/08/2019  . 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 Intertrigo 06/04/2019  . Pancytopenia ([place]) 06/04/2019  . Malnutrition of moderate degree 06/03/2019  . Alteration in skin integrity due to moisture 06/02/2019  . DNR (do not resuscitate) 06/02/2019  . AKI (acute kidney injury) ([place]) 05/28/2019  . Hyperkalemia 05/28/2019  . UTI (urinary tract infection) 05/28/2019  . Hypotension 05/28/2019  . Acute kidney injury superimposed on CKD ([place]) 05/28/2019  . Brain metastasis ([place]) 04/28/2019  . Hypothyroidism 04/28/2019  . Anemia of chronic disease 04/28/2019  . Encounter for antineoplastic chemotherapy 02/06/2019  . Port-A-Cath in place 08/01/2018  . Goals of care, counseling/discussion 06/26/2018  . Recurrent right pleural effusion 05/30/2018  . Malignant pleural effusion 05/19/2018  . Hilar adenopathy 05/15/2018  . Pleural effusion on right 04/25/2018  . Seasonal allergic rhinitis due to pollen 01/16/2018  . Aortic atherosclerosis ([place]) 11/11/2017  . Morbid obesity with BMI of 40.0-44.9, adult ([place]) 11/11/2017  . Genetic testing  06/27/2017  . Family history of  non-Hodgkin's lymphoma   . Family history of lung cancer   . Endometrial thickening on ultrasound 01/25/2017  . Metastatic breast cancer ([place]) 11/05/2016  . Malignant neoplasm of upper-outer quadrant of right breast in female, estrogen receptor negative ([place]) 10/09/2016  . Primary localized osteoarthritis of left knee 02/06/2016  . Primary osteoarthritis of left knee 02/05/2016  . Osteoarthritis of left knee 06/08/2015  . CKD (chronic kidney disease), stage III 06/08/2015  . Vitamin D deficiency 09/23/2014  . Acute renal insufficiency 04/23/2014  . 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 Bell's palsy 12/04/2012  . Contact dermatitis 08/29/2011  . Dry eyes 08/20/2011  . Dry mouth 08/20/2011  . Neuropathy 04/09/2011  . Insomnia 04/09/2011  . Obesities, morbid ([place]) 09/27/2010  . Endometrial polyp 12/22/2009  . Alopecia areata 11/28/2009  . MENORRHAGIA, POSTMENOPAUSAL 02/03/2009  . ABNORMAL GLANDULAR PAPANICOLAOU SMEAR OF CERVIX 11/04/2008  . ALOPECIA 05/06/2008  . Trigger finger, acquired 05/06/2008  . HIP FRACTURE, RIGHT 05/06/2008  . ARTHROSCOPY, LEFT KNEE, HX OF 02/03/2008  . HYPERLIPIDEMIA, MIXED 12/15/2007  . HAND PAIN, RIGHT 12/15/2007  . Other abnormal glucose 12/15/2007  . UNSPECIFIED VITAMIN D DEFICIENCY 08/06/2007  . TINEA CAPITIS 08/04/2007  . CNTC DERMATITIS&OTH ECZEMA DUE OTH CHEM PRODUCTS 08/04/2007  . PVD 04/07/2007  . Secondary localized osteoarthrosis 04/07/2007  . Human immunodeficiency virus (HIV) disease ([place]) 03/27/2006  . HTN (hypertension) 03/27/2006  . BREAST CANCER, HX OF 03/27/2006    Palliative Care Plan    Recommendations/Plan:  Current goals are set.  Patient DNR.  Would like to continue full scope, full treatment with dc to SNF when able.  She is very fragile and at high risk for acute decline again during this hospitaliation  PMT will follow intermittently and certainly re-engage if she declines.  Please  do not hesitated to call us sooner if needed.  Goals of Care and Additional Recommendations:  Limitations on Scope of Treatment: Full Scope Treatment  Code Status:  DNR  Prognosis:   Unable to determine  I'm very concerned her prognosis is less than 6 months given her recent illness, poor PO intake and inability to pursue chemotherapy without significant improvement.   Discharge Planning at this point:  [place] for rehab with Palliative care service follow-up.    Care plan was discussed with patient, daughter, and PMT attending.  Thank you for allowing the Palliative Medicine Team to assist in the care of this patient.  Total time spent:  35 min.     Greater than 50%  of this time was spent counseling and coordinating care related to the above assessment and plan.  Florentina Jenny, PA-C Palliative Medicine  Please contact Palliative MedicineTeam phone at [phone] for questions and concerns between 7 am - 7 pm.   Please see AMION for individual provider pager numbers.

## 2019-06-25 NOTE — Progress Notes (Signed)
Patient was taken off of BIPAP by RN and placed on [place].  VS stable.  Patient was alert.

## 2019-06-26 LAB — HEPARIN ANTI-XA: Heparin LMW: 1.06 IU/mL

## 2019-06-26 NOTE — Progress Notes (Signed)
[facility] for Lovenox Indication: DVT  Allergies  Allergen Reactions  . Lisinopril Anaphylaxis, Swelling and Other (See Comments)    Swelling of tongue and mouth 11/05/16- tolerates Olmesartan  . Pepcid [Famotidine] Other (See Comments)    PPI H2, BLOCKERS LOWER GASTRIC PH WHICH WOULD LEAD TO SUBTHERAPEUTIC RILPIVIRINE LEVELS AND POTENTIAL VIROLOGICAL FAILURE WITH RESISTANCE  . Prilosec [Omeprazole] Other (See Comments)    PPI H2, BLOCKERS LOWER GASTRIC PH WHICH WOULD LEAD TO SUBTHERAPEUTIC RILPIVIRINE LEVELS AND POTENTIAL VIROLOGICAL FAILURE WITH RESISTANCE  . Tums [Calcium Carbonate Antacid] Other (See Comments)    TUMS ANTACIDS CAN LOWER GASTRIC PH WHICH COULD  LEAD TO SUBTHERAPEUTIC RILPIVIRINE LEVELS AND POTENTIAL VIROLOGICAL FAILURE WITH RESISTANCE TUMS CAN BE GIVEN BUT NEED CONSULT WITH [facility] PHARM[facility] RE TIMING. I PREFER HER TO AVOID ALL TOGETHER    Patient Measurements: Height: 5\' 7"  (170.2 cm) Weight: 297 lb (134.7 kg) IBW/kg (Calculated) : 61.6  Vital Signs: Temp: 98.2 F (36.8 C) (01/01 0500) Temp Source: Oral (01/01 0500) BP: 116/75 (01/01 0500) Pulse Rate: 91 (01/01 0500)  Labs: Recent Labs    06/23/19 1436 06/24/19 0500 06/25/19 1019 06/26/19 0930  HGB 9.4* 8.4* 8.4*  --   HCT 30.5* 27.9* 27.5*  --   PLT 256 243 237  --   HEPRLOWMOCWT  --   --   --  1.06  CREATININE  --  0.92 0.92  --     Estimated Creatinine Clearance: 89.7 mL/min (by C-G formula based on SCr of 0.92 mg/dL).   Medical History: Past Medical History:  Diagnosis Date  . AKI (acute kidney injury) ([place]) 06/02/2019  . Alopecia areata 11/28/2009  . Bell's palsy   . Cancer [facility]) 2005   Breast cancer   chemotherapy and radiation  . CKD (chronic kidney disease) stage 3, GFR 30-59 ml/min 06/08/2015  . Dry eye syndrome   . Family history of lung cancer   . Family history of non-Hodgkin's lymphoma   . Fasting hyperglycemia   . Gestational diabetes    2001   . HIP FRACTURE, RIGHT 05/06/2008  . History of kidney stones   . HIV DISEASE 03/27/2006  . HYPERLIPIDEMIA, MIXED 12/15/2007  . HYPERTENSION 03/27/2006  . 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 MENORRHAGIA, POSTMENOPAUSAL 02/03/2009  . Osteoarthritis of left knee 06/08/2015  . OSTEOARTHROSIS, LOCAL, SCND, UNSPC SITE 04/07/2007  . Personal history of chemotherapy 11/2018  . PVD 04/07/2007  . Tinea capitis   . TRIGGER FINGER 05/06/2008  . Unspecified vitamin D deficiency 08/06/2007    Assessment: 64 year old female with RLE nonpitting edema and history of metastatic cancer found to have diffuse DVT on LE Dopplers. Pharmacy consulted for Lovenox therapy.   LMWH level today is therapeutic at 1.06. her H&H has been stable with her last CBC yesterday showing H&H of 8.4/27.5. her renal function has been stable, last serum creatinine 0.92 with an estimated CrCl of about 90 ml/min.   Goal of Therapy:  Anti-Xa lovenox level 0.6-1.2 drawn 4 hrs after dose. Monitor platelets by anticoagulation protocol: Yes   Plan:  -Continue lovenox 50 mg q 12 hrs.  Have been determining dosing from LMWH levels instead of weight based dosing, as weight has been questionable this admission.  LMWH levels on traditional weight based dosing were very elevated. -Follow up length of treatment and possibility to transition to oral anticoagulation    Thank you,   Eddie Candle, PharmD PGY-1 Pharmacy Resident   Please check amion for  clinical pharmacist contact number

## 2019-06-26 NOTE — Progress Notes (Signed)
She has been off and on BiPAP overnight but seems to have steadily improved hospitalist daily note   Kayla Price [id number] DOB: 1956/05/13 DOA: 06/01/2019  PCP: Lauree Chandler, NP  Narrative:   64 year old black female metastatic breast cancer status post lumpectomy 03/2004 with radical mastectomy 2018-status post Keytruda, Tecentriq with Neulasta--and widely metastatic breast cancer with poor performance Metastatic disease with visual deficits based on intracranial spread and vasogenic edema HIV followed by Dr. 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 secondary to cefepime as per neurology she had EEG on 12/24 on 12/27 showing diffuse encephalopathy initially and then subsequently nonspecific dysfunction Intermittently using BiPAP but improvement in confusion is seen Acute hypercarbic respiratory failure Has been intermittently on BiPAP since 12/27 and has needed it since this morning Palliative care has seen the patient and she still wishes to go to skilled facility is not willing to "give up"-we will have to monitor Check a.m. 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 Wynelle Cleveland discussed with Dr. 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 Status post whole brain radiation 11/20 chemo and mastectomies  Called and discussed with daughter [phone]/06/1929 that is stable lovenox  DNR Inpatient not ready for d/c yet-we will need to be From BiPAP durably prior to discharge  Subjective: Off BiPAP right now but had to go back on at around 10 AM for a little while as oxygen levels dropped Tells me she wishes to go to skilled facility Palliative note reviewed  Consultants:   CCM Procedures:   Multiple Antimicrobials:   Completed Abdomen soft   Objective: Vitals:   06/25/19 1552 06/25/19 1700 06/25/19 2243 06/26/19 0500  BP:   110/74 116/75  Pulse: 97 100 98 91  Resp: 17 18 (!) 27 (!) 26  Temp:   99.1 F (37.3 C) 98.2 F (36.8 C)  TempSrc:   Oral Oral  SpO2: 100% 98% 100% 100%  Weight:    134.7 kg  Height:        Intake/Output Summary (Last 24 hours) at 06/26/2019 1149 Last data filed at 06/26/2019 0645 Gross per 24 hour  Intake 240 ml  Output 1000 ml  Net -760 ml   Filed Weights   06/24/19 0512 06/25/19 0434 06/26/19 0500  Weight: (!) 137.4 kg (!) 137.4 kg 134.7 kg    Examination: Frail cachectic African-American female EOMI NCAT no icterus no pallor neck soft supple Chest clinically clear no  added sound no rales no rhonchi No lower extremity edema No rebound no guarding  Scheduled Meds: . Chlorhexidine Gluconate Cloth  6 each Topical Daily  . darunavir-cobicistat  1 tablet Oral QHS  . dolutegravir  50 mg  Oral QHS   And  . rilpivirine  25 mg Oral QHS  . enoxaparin (LOVENOX) injection  50 mg Subcutaneous Q12H  . feeding supplement (ENSURE ENLIVE)  237 mL Oral TID BM  . levothyroxine  100 mcg Intravenous Daily  . multivitamin with minerals  1 tablet Oral Daily   Continuous Infusions: . sodium chloride Stopped (06/22/19 0036)  . dextrose 50 mL/hr at 06/25/19 2247  . levETIRAcetam 250 mg (06/26/19 0934)     LOS: 23 days   Time spent: [place], MD [facility]

## 2019-06-27 LAB — COMPREHENSIVE METABOLIC PANEL
ALT: 23 U/L (ref 0–44)
AST: 28 U/L (ref 15–41)
Albumin: 1.8 g/dL — ABNORMAL LOW (ref 3.5–5.0)
Alkaline Phosphatase: 56 U/L (ref 38–126)
Anion gap: 5 (ref 5–15)
BUN: 8 mg/dL (ref 8–23)
CO2: 32 mmol/L (ref 22–32)
Calcium: 8.4 mg/dL — ABNORMAL LOW (ref 8.9–10.3)
Chloride: 106 mmol/L (ref 98–111)
Creatinine, Ser: 1.23 mg/dL — ABNORMAL HIGH (ref 0.44–1.00)
GFR calc Af Amer: 54 mL/min — ABNORMAL LOW (ref >60)
GFR calc non Af Amer: 47 mL/min — ABNORMAL LOW (ref >60)
Glucose, Bld: 95 mg/dL (ref 70–99)
Potassium: 3.3 mmol/L — ABNORMAL LOW (ref 3.5–5.1)
Sodium: 143 mmol/L (ref 135–145)
Total Bilirubin: 0.3 mg/dL (ref 0.3–1.2)
Total Protein: 5.3 g/dL — ABNORMAL LOW (ref 6.5–8.1)

## 2019-06-27 LAB — CBC
HCT: 26.3 % — ABNORMAL LOW (ref 36.0–46.0)
Hemoglobin: 7.7 g/dL — ABNORMAL LOW (ref 12.0–15.0)
MCH: 31.7 pg (ref 26.0–34.0)
MCHC: 29.3 g/dL — ABNORMAL LOW (ref 30.0–36.0)
MCV: 108.2 fL — ABNORMAL HIGH (ref 80.0–100.0)
Platelets: 211 10*3/uL (ref 150–400)
RBC: 2.43 MIL/uL — ABNORMAL LOW (ref 3.87–5.11)
RDW: 25.6 % — ABNORMAL HIGH (ref 11.5–15.5)
WBC: 10.6 10*3/uL — ABNORMAL HIGH (ref 4.0–10.5)
nRBC: 0.5 % — ABNORMAL HIGH (ref 0.0–0.2)

## 2019-06-27 MED ORDER — LEVETIRACETAM 250 MG PO TABS
250.0000 mg | ORAL_TABLET | Freq: Two times a day (BID) | ORAL | Status: DC
  Administered 2019-06-27 – 2019-06-29 (×6): 250 mg via ORAL
  Filled 2019-06-27 (×11): qty 1

## 2019-06-27 MED ORDER — POTASSIUM CHLORIDE CRYS ER 20 MEQ PO TBCR
40.0000 meq | EXTENDED_RELEASE_TABLET | Freq: Every day | ORAL | Status: DC
  Administered 2019-06-27 – 2019-06-29 (×3): 40 meq via ORAL
  Filled 2019-06-27 (×3): qty 2

## 2019-06-27 MED ORDER — LEVOTHYROXINE SODIUM 100 MCG PO TABS
200.0000 ug | ORAL_TABLET | Freq: Every day | ORAL | Status: DC
  Administered 2019-06-27 – 2019-06-30 (×4): 200 ug via ORAL
  Filled 2019-06-27 (×4): qty 2

## 2019-06-27 NOTE — Progress Notes (Signed)
hospitalist daily note   Kayla Price [id number] DOB: 03/20/1956 DOA: 06/01/2019  PCP: Lauree Chandler, NP  Narrative:   64 year old black female metastatic breast cancer status post lumpectomy 03/2004 with radical mastectomy 2018-status post Keytruda, Tecentriq with Neulasta--and widely metastatic breast cancer with poor performance Metastatic disease with visual deficits based on intracranial spread and vasogenic edema HIV followed by Dr. 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 secondary to cefepime as per neurology she had EEG on 12/24 on 12/27 showing diffuse encephalopathy initially and then subsequently nonspecific dysfunction Intermittently using BiPAP but improvement in confusion is seen Acute hypercarbic respiratory failure Has been intermittently on BiPAP since 12/27 -does not have daily requirements overall seems to be better Palliative care has seen the patient and she still wishes to go to skilled facility is not willing to "give up"-we will have to monitor Check a.m. 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 Wynelle Cleveland discussed with Dr. 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 Status post whole brain radiation 11/20 chemo and mastectomies  Called and discussed with daughter [phone] lovenox  DNR Inpatient nearing discharge if remains stable off of BiPAP likely skilled facility placement when available we will ask transition care coordinator to coordinate  Subjective: Did not require BiPAP this morning overall is doing fair Ate minimally however He is more coherent than she has been without any real issues  Consultants:   CCM Procedures:   Multiple Antimicrobials:   Completed   Objective: Vitals:   06/27/19 0504 06/27/19 0516 06/27/19 0613 06/27/19 0700  BP:  114/82    Pulse: (!) 111  97 97  Resp:   (!) 9 10  Temp: 98.7 F (37.1 C)     TempSrc: Oral     SpO2: 99%  100% 100%  Weight: 131.1 kg     Height:        Intake/Output Summary (Last 24 hours) at 06/27/2019 1147 Last data filed at 06/27/2019 1000 Gross per 24 hour  Intake 855 ml  Output 1950 ml  Net -1095 ml   Filed Weights   06/25/19 0434 06/26/19 0500 06/27/19 0504  Weight: (!) 137.4 kg 134.7 kg 131.1 kg    Examination: Frail cachectic African-American female  EOMI NCAT no icterus no pallor neck soft supple  Chest clinically clear no added sound no rales no rhonchi No lower extremity edema  Wounds in the  groin as well as on sacrum seem to be healing compared to prior assessments when I examined with nursing No rebound no guarding  Scheduled Meds: . Chlorhexidine  Gluconate Cloth  6 each Topical Daily  . darunavir-cobicistat  1 tablet Oral QHS  . dolutegravir  50 mg Oral QHS   And  . rilpivirine  25 mg Oral QHS  . enoxaparin (LOVENOX) injection  50 mg Subcutaneous Q12H  . feeding supplement (ENSURE ENLIVE)  237 mL Oral TID BM  . levETIRAcetam  250 mg Oral BID  . levothyroxine  200 mcg Oral Q0600  . multivitamin with minerals  1 tablet Oral Daily  . potassium chloride  40 mEq Oral Daily   Continuous Infusions: . sodium chloride Stopped (06/22/19 0036)     LOS: 24 days   Time spent: [place], MD [facility]

## 2019-06-27 NOTE — Procedures (Signed)
Patient in NAD, resting comfortable on 2lpm [place] with sats of 98%.  Bipap on standby.

## 2019-06-28 DIAGNOSIS — C50919 Malignant neoplasm of unspecified site of unspecified female breast: Secondary | ICD-10-CM

## 2019-06-28 LAB — RENAL FUNCTION PANEL
Albumin: 1.9 g/dL — ABNORMAL LOW (ref 3.5–5.0)
Anion gap: 5 (ref 5–15)
BUN: 9 mg/dL (ref 8–23)
CO2: 34 mmol/L — ABNORMAL HIGH (ref 22–32)
Calcium: 9 mg/dL (ref 8.9–10.3)
Chloride: 107 mmol/L (ref 98–111)
Creatinine, Ser: 1.26 mg/dL — ABNORMAL HIGH (ref 0.44–1.00)
GFR calc Af Amer: 53 mL/min — ABNORMAL LOW (ref >60)
GFR calc non Af Amer: 45 mL/min — ABNORMAL LOW (ref >60)
Glucose, Bld: 100 mg/dL — ABNORMAL HIGH (ref 70–99)
Phosphorus: 2.7 mg/dL (ref 2.5–4.6)
Potassium: 4.1 mmol/L (ref 3.5–5.1)
Sodium: 146 mmol/L — ABNORMAL HIGH (ref 135–145)

## 2019-06-28 NOTE — Progress Notes (Signed)
Daily Progress Note   Patient Name: Kayla Price       Date: 06/28/2019 DOB: [birth year]  Age: 64 y.o. MRN#: [id number] Attending Physician: Nita Sells, MD Primary Care Physician: Lauree Chandler, NP Admit Date: 06/01/2019  Reason for Consultation/Follow-up: Establishing goals of care and Psychosocial/spiritual support  Subjective: Patient appears very weak and has difficulty staying awake.  She emits a small grunt with each breath.  "I want to learn about rehab"  I respond that today will likely be a quiet day but she will learn more tomorrow.  She tells me she had a good night and that she ate well this morning.   Assessment: Very frail deconditioned female.  Currently on 2L n/c.  Strong will.  Seems very determined to go to rehab despite a body that appears to be failing.   Patient Profile/HPI: she is a 64 year old female with past medical history of Breast cancer metastasis to the brain and right pleura status post whole brain radiation, HIV, CKD stage III and recent admission for superficial skin breakdown and ulceration who was admitted with HSV ulcerations and right lower extremity DVT who suffered complication of worsening acute hypoxic respiratory failure likely secondary to pneumonia versus lymphangitic spread.  She suffered an acute mental status change thought to be due to cefepime toxicity.  Her mental status has now improved.   Length of Stay: 25  Current Medications: Scheduled Meds:  . Chlorhexidine Gluconate Cloth  6 each Topical Daily  . darunavir-cobicistat  1 tablet Oral QHS  . dolutegravir  50 mg Oral QHS   And  . rilpivirine  25 mg Oral QHS  . enoxaparin (LOVENOX) injection  50 mg Subcutaneous Q12H  . feeding supplement (ENSURE ENLIVE)  237 mL Oral  TID BM  . levETIRAcetam  250 mg Oral BID  . levothyroxine  200 mcg Oral Q0600  . multivitamin with minerals  1 tablet Oral Daily  . potassium chloride  40 mEq Oral Daily    Continuous Infusions: . sodium chloride Stopped (06/22/19 0036)    PRN Meds: sodium chloride, acetaminophen **OR** acetaminophen, calcium carbonate (dosed in mg elemental calcium), camphor-menthol **AND** hydrOXYzine, docusate sodium, HYDROcodone-acetaminophen, LORazepam, morphine injection, ondansetron **OR** ondansetron (ZOFRAN) IV, sodium chloride flush, sorbitol, zolpidem  Physical Exam        Chronically ill appearing  female, lying in bed asleep.  Wakes to give short answers to questions and then falls back to sleep. CV rrr Resp shallow breaths, grunting with each one  Vital Signs: BP 115/74 (BP Location: Left Arm)   Pulse 88   Temp 98.4 F (36.9 C) (Oral)   Resp 19   Ht 5\' 7"  (1.702 m)   Wt 135.2 kg   LMP 11/06/2003   SpO2 98%   BMI 46.67 kg/m  SpO2: SpO2: 98 % O2 Device: O2 Device: Nasal Cannula O2 Flow Rate: O2 Flow Rate (L/min): 2 L/min  Intake/output summary:   Intake/Output Summary (Last 24 hours) at 06/28/2019 1037 Last data filed at 06/28/2019 1000 Gross per 24 hour  Intake 1030 ml  Output 750 ml  Net 280 ml   LBM: Last BM Date: 06/23/19 Baseline Weight: Weight: 90.7 kg Most recent weight: Weight: 135.2 kg       Palliative Assessment/Data:  30%    Flowsheet Rows     Most Recent Value  Intake Tab  Referral Department  -- [hematology]  Unit at Time of Referral  Other (Comment) [renal]  Palliative Care Primary Diagnosis  Cancer  Date Notified  06/12/19  Palliative Care Type  New Palliative care  Reason for referral  Clarify Goals of Care  Date of Admission  06/01/19  Date first seen by Palliative Care  06/13/19  # of days Palliative referral response time  1 Day(s)  # of days IP prior to Palliative referral  11  Clinical Assessment  Psychosocial & Spiritual Assessment    Palliative Care Outcomes      Patient Active Problem List   Diagnosis Date Noted  . Palliative care encounter   . Right lower lobe pneumonia 06/09/2019  . Pressure injury of skin 06/09/2019  . Acute hypoxemic respiratory failure ([place]) 06/09/2019  . Right leg DVT ([place]) 06/09/2019  . Edema of right lower extremity 06/08/2019  . 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 Intertrigo 06/04/2019  . Pancytopenia ([place]) 06/04/2019  . Malnutrition of moderate degree 06/03/2019  . Alteration in skin integrity due to moisture 06/02/2019  . DNR (do not resuscitate) 06/02/2019  . AKI (acute kidney injury) ([place]) 05/28/2019  . Hyperkalemia 05/28/2019  . UTI (urinary tract infection) 05/28/2019  . Hypotension 05/28/2019  . Acute kidney injury superimposed on CKD ([place]) 05/28/2019  . Brain metastasis ([place]) 04/28/2019  . Hypothyroidism 04/28/2019  . Anemia of chronic disease 04/28/2019  . Encounter for antineoplastic chemotherapy 02/06/2019  . Port-A-Cath in place 08/01/2018  . Goals of care, counseling/discussion 06/26/2018  . Recurrent right pleural effusion 05/30/2018  . Malignant pleural effusion 05/19/2018  . Hilar adenopathy 05/15/2018  . Pleural effusion on right 04/25/2018  . Seasonal allergic rhinitis due to pollen 01/16/2018  . Aortic atherosclerosis ([place]) 11/11/2017  . Morbid obesity with BMI of 40.0-44.9, adult ([place]) 11/11/2017  . Genetic testing 06/27/2017  . Family history of non-Hodgkin's lymphoma   . Family history of lung cancer   . Endometrial thickening on ultrasound 01/25/2017  . Metastatic breast cancer ([place]) 11/05/2016  . Malignant neoplasm of upper-outer quadrant of right breast in female, estrogen receptor negative ([place]) 10/09/2016  . Primary localized osteoarthritis of left knee 02/06/2016  . Primary osteoarthritis of left knee 02/05/2016  . Osteoarthritis of left knee 06/08/2015  . CKD (chronic kidney disease), stage III 06/08/2015  . Vitamin D  deficiency 09/23/2014  . Acute renal insufficiency 04/23/2014  . 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 Bell's palsy  12/04/2012  . Contact dermatitis 08/29/2011  . Dry eyes 08/20/2011  . Dry mouth 08/20/2011  . Neuropathy 04/09/2011  . Insomnia 04/09/2011  . Obesities, morbid ([place]) 09/27/2010  . Endometrial polyp 12/22/2009  . Alopecia areata 11/28/2009  . MENORRHAGIA, POSTMENOPAUSAL 02/03/2009  . ABNORMAL GLANDULAR PAPANICOLAOU SMEAR OF CERVIX 11/04/2008  . ALOPECIA 05/06/2008  . Trigger finger, acquired 05/06/2008  . HIP FRACTURE, RIGHT 05/06/2008  . ARTHROSCOPY, LEFT KNEE, HX OF 02/03/2008  . HYPERLIPIDEMIA, MIXED 12/15/2007  . HAND PAIN, RIGHT 12/15/2007  . Other abnormal glucose 12/15/2007  . UNSPECIFIED VITAMIN D DEFICIENCY 08/06/2007  . TINEA CAPITIS 08/04/2007  . CNTC DERMATITIS&OTH ECZEMA DUE OTH CHEM PRODUCTS 08/04/2007  . PVD 04/07/2007  . Secondary localized osteoarthrosis 04/07/2007  . Human immunodeficiency virus (HIV) disease ([place]) 03/27/2006  . HTN (hypertension) 03/27/2006  . BREAST CANCER, HX OF 03/27/2006    Palliative Care Plan    Recommendations/Plan:  Goals set.  DNR with full scope treatment  PMT will continue to check in intermittently in case she declines.    At high risk for acute decline.  At high risk for re-hospitalization.  Palliative Care at SNF please.  Daughter needs support and information.  Goals of Care and Additional Recommendations:  Limitations on Scope of Treatment: Full Scope Treatment  Code Status:  DNR  Prognosis:   < 6 months.  Likely much less given poor respiratory status, poor PO intake, bed bound.   Discharge Planning:  [facility] for rehab with Palliative care service follow-up   Thank you for allowing the Palliative Medicine Team to assist in the care of this patient.  Total time spent:  15 min.     Greater than 50%  of this time was spent counseling and coordinating care related to the  above assessment and plan.  Florentina Jenny, PA-C Palliative Medicine  Please contact Palliative MedicineTeam phone at [phone] for questions and concerns between 7 am - 7 pm.   Please see AMION for individual provider pager numbers.

## 2019-06-28 NOTE — Progress Notes (Signed)
Per MD clamped patient's foley to see if she would have the urge to void. Clamped for 1 hour, she did not feel the urge to void. Unclamaped foley and notified MD.

## 2019-06-28 NOTE — Progress Notes (Signed)
Patient stated she does not feel like she needs the bipap at this time. Patient does not appear to be in any respiratory distress and Sp02=96% on 2lpm. Will continue to monitor patient.

## 2019-06-28 NOTE — Progress Notes (Signed)
hospitalist daily note   NEKESHIA LENHARDT [id number] DOB: Sep 18, 1955 DOA: 06/01/2019  PCP: Lauree Chandler, NP  Narrative:   64 year old black female metastatic breast cancer status post lumpectomy 03/2004 with radical mastectomy 2018-status post Keytruda, Tecentriq with Neulasta--and widely metastatic breast cancer with poor performance Metastatic disease with visual deficits based on intracranial spread and vasogenic edema HIV followed by Dr. 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 Wynelle Cleveland discussed with Dr. 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 Status post whole brain radiation 11/20 chemo and mastectomies  Called and LM on VM daughter [phone] 1/3 lovenox  DNR Inpatient nearing discharge if remains stable off of BiPAP-likely can discharge 1/4 AM if all stable  Subjective: Awake alert Becomes a little bit somnolent later in exam No fever no chills Had a good breakfast No chest pain  Consultants:   CCM Procedures:   Multiple Antimicrobials:   Completed   Objective: Vitals:   06/27/19 0613 06/27/19 0700 06/27/19 2037 06/28/19 0627  BP:   115/77 115/74  Pulse: 97 97 94 88  Resp: (!) 9 10 (!) 23 19  Temp:   98.5 F (36.9 C) 98.4 F (36.9 C)  TempSrc:   Oral Oral  SpO2: 100% 100% 98% 98%  Weight:    135.2 kg  Height:        Intake/Output Summary (Last 24 hours) at 06/28/2019 1136 Last data filed at 06/28/2019 1000 Gross per 24 hour  Intake 1030 ml  Output 750 ml  Net 280 ml   Filed Weights   06/26/19 0500 06/27/19 0504 06/28/19 0627  Weight: 134.7 kg 131.1 kg 135.2 kg    Examination: Cachectic no distress EOMI NCAT no focal deficit Abdomen soft nontender [place] relatively clear Abdomen soft  Scheduled Meds: . Chlorhexidine Gluconate Cloth  6 each Topical Daily  . darunavir-cobicistat  1 tablet Oral QHS  . dolutegravir  50 mg Oral  QHS   And  . rilpivirine  25 mg Oral QHS  . enoxaparin (LOVENOX) injection  50 mg Subcutaneous Q12H  . feeding supplement (ENSURE ENLIVE)  237 mL Oral TID BM  . levETIRAcetam  250  mg Oral BID  . levothyroxine  200 mcg Oral Q0600  . multivitamin with minerals  1 tablet Oral Daily  . potassium chloride  40 mEq Oral Daily   Continuous Infusions: . sodium chloride Stopped (06/22/19 0036)     LOS: 25 days   Time spent: [place], MD [facility]

## 2019-06-29 ENCOUNTER — Telehealth: Payer: Self-pay | Admitting: Oncology

## 2019-06-29 LAB — CBC WITH DIFFERENTIAL/PLATELET
Abs Immature Granulocytes: 0.12 10*3/uL — ABNORMAL HIGH (ref 0.00–0.07)
Basophils Absolute: 0 10*3/uL (ref 0.0–0.1)
Basophils Relative: 0 %
Eosinophils Absolute: 0.1 10*3/uL (ref 0.0–0.5)
Eosinophils Relative: 1 %
HCT: 25.6 % — ABNORMAL LOW (ref 36.0–46.0)
Hemoglobin: 7.6 g/dL — ABNORMAL LOW (ref 12.0–15.0)
Immature Granulocytes: 1 %
Lymphocytes Relative: 13 %
Lymphs Abs: 1.2 10*3/uL (ref 0.7–4.0)
MCH: 32.2 pg (ref 26.0–34.0)
MCHC: 29.7 g/dL — ABNORMAL LOW (ref 30.0–36.0)
MCV: 108.5 fL — ABNORMAL HIGH (ref 80.0–100.0)
Monocytes Absolute: 1.3 10*3/uL — ABNORMAL HIGH (ref 0.1–1.0)
Monocytes Relative: 14 %
Neutro Abs: 6.4 10*3/uL (ref 1.7–7.7)
Neutrophils Relative %: 71 %
Platelets: 172 10*3/uL (ref 150–400)
RBC: 2.36 MIL/uL — ABNORMAL LOW (ref 3.87–5.11)
RDW: 25.2 % — ABNORMAL HIGH (ref 11.5–15.5)
WBC: 9.1 10*3/uL (ref 4.0–10.5)
nRBC: 0 % (ref 0.0–0.2)

## 2019-06-29 LAB — RENAL FUNCTION PANEL
Albumin: 1.9 g/dL — ABNORMAL LOW (ref 3.5–5.0)
Anion gap: 6 (ref 5–15)
BUN: 13 mg/dL (ref 8–23)
CO2: 34 mmol/L — ABNORMAL HIGH (ref 22–32)
Calcium: 9.3 mg/dL (ref 8.9–10.3)
Chloride: 106 mmol/L (ref 98–111)
Creatinine, Ser: 1.33 mg/dL — ABNORMAL HIGH (ref 0.44–1.00)
GFR calc Af Amer: 49 mL/min — ABNORMAL LOW (ref >60)
GFR calc non Af Amer: 42 mL/min — ABNORMAL LOW (ref >60)
Glucose, Bld: 85 mg/dL (ref 70–99)
Phosphorus: 2.6 mg/dL (ref 2.5–4.6)
Potassium: 4.4 mmol/L (ref 3.5–5.1)
Sodium: 146 mmol/L — ABNORMAL HIGH (ref 135–145)

## 2019-06-29 LAB — SARS CORONAVIRUS 2 (TAT 6-24 HRS): SARS Coronavirus 2: NEGATIVE

## 2019-06-29 MED ORDER — HYDROCODONE-ACETAMINOPHEN 5-325 MG PO TABS: 1.0000 | ORAL_TABLET | Freq: Four times a day (QID) | ORAL | 0 refills | Status: DC | PRN

## 2019-06-29 MED ORDER — RILPIVIRINE HCL 25 MG PO TABS: 25.0000 mg | ORAL_TABLET | Freq: Every day | ORAL | Status: DC

## 2019-06-29 MED ORDER — LEVETIRACETAM 250 MG PO TABS: 250.0000 mg | ORAL_TABLET | Freq: Two times a day (BID) | ORAL | 0 refills | Status: DC

## 2019-06-29 MED ORDER — DOLUTEGRAVIR SODIUM 50 MG PO TABS: 50.0000 mg | ORAL_TABLET | Freq: Every day | ORAL | Status: DC

## 2019-06-29 MED ORDER — HYDROXYZINE HCL 25 MG PO TABS: 25.0000 mg | ORAL_TABLET | Freq: Three times a day (TID) | ORAL | 0 refills | Status: DC | PRN

## 2019-06-29 MED ORDER — ENOXAPARIN SODIUM 60 MG/0.6ML ~~LOC~~ SOLN: 50.0000 mg | Freq: Two times a day (BID) | SUBCUTANEOUS | Status: DC

## 2019-06-29 MED FILL — hydrOXYzine HCL 25 MG TABS: 25 | 10 days supply | Qty: 30 | Fill #0

## 2019-06-29 NOTE — Progress Notes (Signed)
Patient placed on BIPAP by RT around 0240. Patient observed gasping for air, respirations in the 30's and will yell out. She denied pain and was hesitant to wear BIPAP initially. RT called to check on patient and successfully place her on BIPAP. Will continue to monitor pt.

## 2019-06-29 NOTE — Discharge Summary (Signed)
Physician Discharge Summary  Kayla Price:[id number] DOB: 1955/11/22 DOA: 06/01/2019  PCP: Lauree Chandler, NP  Admit date: 06/01/2019 Discharge date: 06/29/2019  Time spent: 35 minutes  Recommendations for Outpatient Follow-up:  1. consider outpatient goals of care if patient continues to decline as she has a very poor prognosis but family wishes to attempt to rehab 2. Will need BiPAP at night settings on auto titration 3. Keep Foley catheter in place for urinary retention as well as sacral wounds 4. Will need oxygen at all times at around 3 L 5. Needs Chem-12 CBC in 1 week   Discharge Diagnoses:  Principal Problem:   AKI (acute kidney injury) ([place]) Active Problems:   Human immunodeficiency virus (HIV) disease ([place])   HYPERLIPIDEMIA, MIXED   HTN (hypertension)   PVD   Alopecia areata   BREAST CANCER, HX OF   Hypothyroidism   Hypotension   Acute kidney injury superimposed on CKD (HCC)   Alteration in skin integrity due to moisture   DNR (do not resuscitate)   Malnutrition of moderate degree   Intertrigo   Pancytopenia (HCC)   HSV-2 infection   Genital HSV   Edema of right lower extremity   Right lower lobe pneumonia   Pressure injury of skin   Acute hypoxemic respiratory failure (HCC)   Right leg DVT ([place])   Palliative care encounter   Discharge Condition: Guarded  Diet recommendation: Heart healthy  Filed Weights   06/27/19 0504 06/28/19 0627 06/29/19 0410  Weight: 131.1 kg 135.2 kg (!) 136.5 kg    History of present illness:  64 year old black female metastatic breast cancer status post lumpectomy 03/2004 with radical mastectomy 2018-status post Keytruda, Tecentriq with Neulasta--and widely metastatic breast cancer with poor performance Metastatic disease with visual deficits based on intracranial spread and vasogenic edema HIV followed by Dr. Tommy Medal HLD, HTN, HLD, CKD 3, anemia of chronic disease  Admit 06/01/2019 weakness--- was admitted 12 2  through 12/6 for hypotension along with ulcers in her genital and buttock region and was found to be hypotensive and Benicar was stopped She was admitted with AKI decubitus ulcers--ulcers were tested positive for HSV and she completed Valtrex-hospitalization complicated by, right lower extremity DVT, acute hypoxic respiratory failure-transferred to stepdown 12/17 and palliative medicine saw the patient she also had metabolic encephalopathy  Hospital Course:   Toxic metabolic encephalopathy secondary to cefepime Work-up completed? 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 Wynelle [place] discussed with Dr. 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 Status post whole brain radiation 11/20 chemo and mastectomies-palliative care EOMI  NCAT no focal deficit saw the patient and felt that she was at high risk for decline however family wished to attempt at least rehab as did the patient We will discharged to rehab and hope that she does not decline further  Consultations:  Palliative care  Oncology as well as oncology  Discharge Exam: Vitals:   06/29/19 0241 06/29/19 0410  BP:  117/81  Pulse: 65 91  Resp: (!) 22 18  Temp:  (!) 97.5 F (36.4 C)  SpO2: 97% 99%    General: Cachectic EOMI NCAT Cardiovascular: S1-S2 no murmur rub or gallop Respiratory: Clinically clear no added sound Abdomen soft nontender No lower extremity edema  Discharge Instructions    Allergies as of 06/29/2019      Reactions   Lisinopril Anaphylaxis, Swelling, Other (See Comments)   Swelling of tongue and mouth 11/05/16- tolerates Olmesartan   Pepcid [famotidine] Other (See Comments)   PPI H2, BLOCKERS LOWER GASTRIC PH WHICH WOULD LEAD TO SUBTHERAPEUTIC RILPIVIRINE LEVELS AND POTENTIAL VIROLOGICAL FAILURE WITH RESISTANCE   Prilosec [omeprazole] Other (See Comments)   PPI H2, BLOCKERS LOWER GASTRIC PH WHICH WOULD LEAD TO SUBTHERAPEUTIC RILPIVIRINE LEVELS AND POTENTIAL VIROLOGICAL FAILURE WITH RESISTANCE   Tums [calcium Carbonate Antacid] Other (See Comments)   TUMS ANTACIDS CAN LOWER GASTRIC PH WHICH COULD  LEAD TO SUBTHERAPEUTIC RILPIVIRINE LEVELS AND POTENTIAL VIROLOGICAL FAILURE WITH RESISTANCE TUMS CAN BE GIVEN BUT NEED CONSULT WITH ID PHARMACY RE TIMING. I PREFER HER TO AVOID ALL TOGETHER      Medication List    STOP taking these medications   cefdinir 300 MG capsule Commonly known as: OMNICEF   dexamethasone 2 MG tablet Commonly known as: DECADRON   ELDERBERRY PO   EQ Nutritional Shake Liqd   feeding supplement (ENSURE ENLIVE) Liqd   fluconazole 200 MG tablet Commonly known as: DIFLUCAN   Juluca 50-25 MG Tabs Generic drug: Dolutegravir-Rilpivirine   methocarbamol 500 MG tablet Commonly known as: ROBAXIN    naproxen sodium 220 MG tablet Commonly known as: ALEVE   nystatin powder Commonly known as: MYCOSTATIN/NYSTOP   olmesartan-hydrochlorothiazide 40-25 MG tablet Commonly known as: BENICAR HCT   Selzentry 150 MG tablet Generic drug: maraviroc   SYSTANE BALANCE OP     TAKE these medications   BENGAY EX Apply 1 application topically as needed (for muscle or knee pain).   dolutegravir 50 MG tablet Commonly known as: TIVICAY Take 1 tablet (50 mg total) by mouth at bedtime.   enoxaparin 60 MG/0.6ML injection Commonly known as: LOVENOX Inject 0.5 mLs (50 mg total) into the skin every 12 (twelve) hours.   HYDROcodone-acetaminophen 5-325 MG tablet Commonly known as: NORCO/VICODIN Take 1 tablet by mouth every 6 (six) hours as needed for up to 2 days for severe pain. What changed:   how much to take  how to take this  when to take this  reasons to take this  additional instructions   hydrOXYzine 25 MG tablet Commonly known as: ATARAX/VISTARIL Take 1 tablet (25 mg total) by mouth every 8 (eight) hours as needed for itching.   levETIRAcetam 250 MG tablet Commonly known as: KEPPRA Take 1 tablet (250 mg total) by mouth 2 (two) times daily.   levothyroxine 200 MCG tablet Commonly known as: SYNTHROID Take 1 tablet (200 mcg total) by mouth daily before breakfast.  lidocaine-prilocaine cream Commonly known as: EMLA APPLY 1 APPLICATION TOPICALLY AS NEEDED. APPLY TO PORT SITE 1 HOUR PRIOR TO ACCESS What changed:   reasons to take this  additional instructions   Loratadine 10 MG Caps Take 1 capsule by mouth as needed (Allergies).   One-A-Day Womens 50 Plus Tabs Take 1 tablet by mouth daily with breakfast.   Prezcobix 800-150 MG tablet Generic drug: darunavir-cobicistat TAKE 1 TABLET BY MOUTH DAILY. SWALLOW WHOLE. DO NOT CRUSH, BREAK OR CHEW TABLETS. TAKE WITH FOOD. What changed: See the new instructions.   rilpivirine 25 MG Tabs tablet Commonly known as:  EDURANT Take 1 tablet (25 mg total) by mouth at bedtime.      Allergies  Allergen Reactions  . Lisinopril Anaphylaxis, Swelling and Other (See Comments)    Swelling of tongue and mouth 11/05/16- tolerates Olmesartan  . Pepcid [Famotidine] Other (See Comments)    PPI H2, BLOCKERS LOWER GASTRIC PH WHICH WOULD LEAD TO SUBTHERAPEUTIC RILPIVIRINE LEVELS AND POTENTIAL VIROLOGICAL FAILURE WITH RESISTANCE  . Prilosec [Omeprazole] Other (See Comments)    PPI H2, BLOCKERS LOWER GASTRIC PH WHICH WOULD LEAD TO SUBTHERAPEUTIC RILPIVIRINE LEVELS AND POTENTIAL VIROLOGICAL FAILURE WITH RESISTANCE  . Tums [Calcium Carbonate Antacid] Other (See Comments)    TUMS ANTACIDS CAN LOWER GASTRIC PH WHICH COULD  LEAD TO SUBTHERAPEUTIC RILPIVIRINE LEVELS AND POTENTIAL VIROLOGICAL FAILURE WITH RESISTANCE TUMS CAN BE GIVEN BUT NEED CONSULT WITH ID PHARMACY RE TIMING. I PREFER HER TO AVOID ALL TOGETHER   Contact information for after-discharge care    Destination    [facility] .   Service: Skilled Nursing Contact information: [address] [place] [phone]               The results of significant diagnostics from this hospitalization (including imaging, microbiology, ancillary and laboratory) are listed below for reference.    Significant Diagnostic Studies: EEG  Result Date: 06/21/2019 Greta Doom, MD     06/21/2019 12:38 PM Patient Name: Kayla Price MRN: [id number] EEG Attending: Roland Rack Referring Physician/Provider: Roland Rack Date: 06/21/2019 Duration: 24 minutes Patient history: 64 yo F with multiple intracranial metastasis with encephalopathy. Recent EEG with triphasic waves. Level of alertness: Encephalopathic AEDs during EEG study: Keppra 500mg  BID Technical aspects: This EEG study was done with scalp electrodes positioned according to the 10-20 International system of electrode placement. Electrical activity was  acquired at a sampling rate of 500Hz  and reviewed with a high frequency filter of 70Hz  and a low frequency filter of 1Hz . EEG data were recorded continuously and digitally stored. BACKGROUND ACTIVITY:  The background consist predominately of generalized irregular slow activity with some frontocentral predominant beta range activity.  There are poorly formed sleep structures at times.  No definite posterior dominant rhythm was seen, however following stimulation there was briefly a posteriorly predominant rhythm of 8 Hz but this was very briefly sustained. EPILEPTIFORM ACTIVITY: Interictal epileptiform activity: None Ictal Activity: None OTHER EVENTS: None SLEEP RECORDINGS: Only awake and drowsy states were recorded. Drowsiness was characterized by attenuation of the posterior background rhythm. ACTIVATION PROCEDURES: Hyperventilation and photic stimulation were not performed. IMPRESSION: This EEG is consistent with a generalized nonspecific cerebral dysfunction.  There has been significant improvement since the previous recording and that no triphasic waves are seen in the study. There was no seizure or evidence of seizure predisposition recorded on this study. Please note that lack of epileptiform activity on EEG does not preclude the possibility of epilepsy.  Roland Rack, MD [facility] [phone] If 7pm- 7am, please page neurology on call as listed in [place].   CT ANGIO CHEST PE W OR WO CONTRAST  Result Date: 06/09/2019 CLINICAL DATA:  Shortness of breath.  Metastatic breast cancer. EXAM: CT ANGIOGRAPHY CHEST WITH CONTRAST TECHNIQUE: Multidetector CT imaging of the chest was performed using the standard protocol during bolus administration of intravenous contrast. Multiplanar CT image reconstructions and MIPs were obtained to evaluate the vascular anatomy. CONTRAST:  98mL OMNIPAQUE IOHEXOL 350 MG/ML SOLN COMPARISON:  PET CT 03/11/2019.  Chest CT 04/22/2018 FINDINGS: Cardiovascular: No  filling defects in the pulmonary arteries to suggest pulmonary emboli. Tortuous, ectatic aorta with maximum diameter 3.7 cm. No dissection. Heart is enlarged. Mediastinum/Nodes: Extensive adenopathy in the mediastinum. Surgical clips in the right axilla. This is unchanged since recent PET CT. Lungs/Pleura: Extensive pleural nodularity/disease throughout right hemithorax, similar to recent PET CT. Right middle lobe mass is unchanged. Central necrotic portion again noted, unchanged. Scattered ground-glass airspace disease noted within both lungs concerning for pneumonia. Upper Abdomen: Gastrohepatic ligament lymph nodes again noted, unchanged. No acute findings. Musculoskeletal: Prior right mastectomy. Right chest wall Port-A-Cath remains in place, unchanged. Sclerotic lesion within the T11 vertebral body is stable. Review of the MIP images confirms the above findings. IMPRESSION: No evidence of pulmonary embolus. Extensive mediastinal adenopathy, right pleural metastatic disease, and right middle lobe mass are all stable since prior PET CT. Worsening ground-glass airspace disease throughout both lungs concerning for pneumonia. Cardiomegaly. Tortuous, ectatic thoracic aorta. Electronically Signed   By: Rolm Baptise M.D.   On: 06/09/2019 17:44   MR BRAIN W WO CONTRAST  Result Date: 06/18/2019 CLINICAL DATA:  Encephalopathy. Metastatic breast cancer. Known brain metastases. EXAM: MRI HEAD WITHOUT AND WITH CONTRAST TECHNIQUE: Multiplanar, multiecho pulse sequences of the brain and surrounding structures were obtained without and with intravenous contrast. CONTRAST:  34mL GADAVIST GADOBUTROL 1 MMOL/ML IV SOLN COMPARISON:  MR head without and with contrast 04/28/2019 FINDINGS: Brain: The right occipital lesion has decreased somewhat. There is mostly peripheral enhancement with a nodular component posteriorly. The lesion now measures 2.4 x 2.6 x 2.1 cm. The lesion previously measured 3.4 x 3.1 x 2.7 cm in the same  dimensions. A left occipital lesion previously measured 13 x 11 mm. It now measures 13 x 9 mm. A more lateral lesion in the left occipital lobe has decreased from 9 mm to 6.5 mm. A lesion in the lateral left temporal lobe has decreased from 6.53.5 mm on image 23 of series 10. A medial right temporal lobe lesion on image 25 has decreased from 7.5 mm to 6 mm. A lesion in the left paramedian superior cerebellar vermis now measures 4 mm. Previously measured 7 mm. A lesion in the left paramedian pons previously measured 4.5 mm. It is now punctate in size. A lesion in the anterior left frontal lobe white matter on image 36 as decreased from 7.5 mm to 3.5 mm. A punctate lesion in the left corona radiata previously measured 4 mm. Punctate lesion is again noted in the posterior left frontal lobe on image 45. No new enhancing lesions are present. Vasogenic edema is most prominent about the occipital lobe lesions. This has decreased since the prior exam. No acute infarct or hemorrhage is present. Ventricles are of normal size. No significant extraaxial fluid collection is present. Vascular: Flow is present in the major intracranial arteries. Skull and upper cervical spine: Extensive marrow signal changes in the calvarium ir consistent with osseous  metastases. Heterogeneous marrow signal is present in the upper cervical spine. Sinuses/Orbits: Minimal fluid is present in the left maxillary sinus. A fluid level is present in the left sphenoid sinus. Left ethmoid air cells are partially opacified. The paranasal sinuses and mastoid air cells are otherwise clear. IMPRESSION: 1. Decrease in size of multiple brain metastases identified on the prior exam. No new lesions are present. 2. Decreased vasogenic edema, still predominantly occipital lobes. 3. Diffuse marrow signal changes in the skull concerning for metastatic disease. 4. Left maxillary and sphenoid sinus disease. Electronically Signed   By: San Morelle M.D.   On:  06/18/2019 18:44   DG CHEST PORT 1 VIEW  Result Date: 06/24/2019 CLINICAL DATA:  History of breast cancer. Shortness of breath. Pneumonia. EXAM: PORTABLE CHEST 1 VIEW COMPARISON:  Single-view of the chest 06/12/2019, 06/18/2019 and 06/19/2019. FINDINGS: Right Port-A-Cath remains in place. Extensive airspace disease in the right lung persist but aeration in the right upper lung zone is mildly improved since the most recent study. Airspace disease in the left lung has markedly improved since the most recent examination. There is some residual atelectasis in the left mid lung zone. Cardiac silhouette is largely obscured. Surgical clips in the right axilla and right IJ approach Port-A-Cath are noted. IMPRESSION: Improved aeration in the left mid lung zone with residual atelectasis noted. Large right pleural effusion and airspace disease persist although aeration in the right upper lung zone appears mildly improved since the most recent study. Electronically Signed   By: Inge Rise M.D.   On: 06/24/2019 13:33   DG CHEST PORT 1 VIEW  Result Date: 06/19/2019 CLINICAL DATA:  Unresponsive and agitated. History metastatic breast carcinoma with known extensive right-sided pleural and parenchymal metastatic disease. EXAM: PORTABLE CHEST 1 VIEW COMPARISON:  06/18/2019 FINDINGS: Stable appearance of Port-A-Cath. Stable volume loss and poor aeration of the right lung secondary to extensive pleural tumor and associated loculated right pleural fluid. Lower volume of the left lung with persistent dense infiltrate in the left mid lung and probable component of infiltrate at the left lung base. No pneumothorax. No significant left-sided pleural fluid identified. IMPRESSION: Stable extensive pleural and parenchymal disease in the right chest with associated loculated right pleural fluid. Stable dense infiltrate in the left mid lung and probable component of infiltrate at the left lung base. Electronically Signed   By:  Aletta Edouard M.D.   On: 06/19/2019 14:37   DG CHEST PORT 1 VIEW  Result Date: 06/18/2019 CLINICAL DATA:  Hypoxia EXAM: PORTABLE CHEST 1 VIEW COMPARISON:  06/13/2019 FINDINGS: Right Port-A-Cath remains in place, unchanged. Cardiomegaly. Diffuse right lung and patchy left lung airspace disease again noted, not significantly changed. Possible right effusion. No acute bony abnormality. IMPRESSION: No significant change since prior study. Electronically Signed   By: Rolm Baptise M.D.   On: 06/18/2019 09:17   DG CHEST PORT 1 VIEW  Result Date: 06/13/2019 CLINICAL DATA:  Hypoxia. EXAM: PORTABLE CHEST 1 VIEW COMPARISON:  June 12, 2019 FINDINGS: The right Port-A-Cath is stable. No pneumothorax. Infiltrate in the left mid lung is more focal in the interval. Infiltrate in the right lung, particularly in the lower right lung is similar in the interval. The cardiomediastinal silhouette is stable. No other interval changes. Stable cardiomediastinal silhouette. IMPRESSION: 1. Stable infiltrate on the right, particularly in the right base. More focal infiltrate in the left mid lung compared to the previous study. Stable right Port-A-Cath. Electronically Signed   By: Dorise Bullion III  M.D   On: 06/13/2019 15:45   DG Chest Port 1 View  Result Date: 06/12/2019 CLINICAL DATA:  Hypoxia. Metastatic breast cancer. EXAM: PORTABLE CHEST 1 VIEW COMPARISON:  CTA chest 06/09/2019. One-view chest x-ray 06/08/2019. FINDINGS: The heart size is normal. The heart is enlarged. Right IJ Port-A-Cath is stable. A right pleural effusion has slightly increased. Right greater than left airspace opacities have progressed. Surgical clips are noted in the right axilla. IMPRESSION: 1. Progressive right greater than left airspace disease compatible with pneumonia. 2. Slight increase in right pleural effusion. 3. Stable right IJ Port-A-Cath. Electronically Signed   By: San Morelle M.D.   On: 06/12/2019 09:29   DG CHEST PORT 1  VIEW  Result Date: 06/08/2019 CLINICAL DATA:  Hypoxia EXAM: PORTABLE CHEST 1 VIEW COMPARISON:  07/10/2018 FINDINGS: Right Port-A-Cath in place with the tip in the SVC. Airspace disease in the right lower lung concerning for pneumonia. No confluent opacity on the left. Heart is borderline in size. Possible small right effusion. No acute bony abnormality. IMPRESSION: Right lower lung airspace disease concerning for pneumonia. Small right effusion suspected. Electronically Signed   By: Rolm Baptise M.D.   On: 06/08/2019 18:55   EEG adult  Result Date: 06/18/2019 Lora Havens, MD     06/18/2019  8:55 PM Patient Name: Kayla Price MRN: [id number] Epilepsy Attending: Lora Havens Referring Physician/Provider: Dr Debbe Odea Date: 06/18/2019 Duration: 24.56 mins Patient history: 64yo F with HIV, brain mets and now with ams. EEG to evaluate for seizure Level of alertness: awake AEDs during EEG study: None Technical aspects: This EEG study was done with scalp electrodes positioned according to the 10-20 International system of electrode placement. Electrical activity was acquired at a sampling rate of 500Hz  and reviewed with a high frequency filter of 70Hz  and a low frequency filter of 1Hz . EEG data were recorded continuously and digitally stored. DESCRIPTION: During awake state, no clear posterior dominant rhythm. EEG also showed continuous generalized  3-5hz  theta-delta slowing. Triphasic waves, generalized, maximal bifrontal, at 2Hz  were also noted. Hyperventilation and photic stimulation were not performed. ABNORMALITY - Continuous slow, generalized - Triphasic waves, generalized IMPRESSION: This study is  Suggestive of severe diffuse encephalopathy, non specific to etiology but could be secondary to toxic-metabolic causes, cepefime toxicity. No seizures or definite epileptiform discharges were seen throughout the recording. Lora Havens   ECHOCARDIOGRAM COMPLETE  Result Date: 06/12/2019    ECHOCARDIOGRAM REPORT   Patient Name:   VIOLET SEABURY Date of Exam: 06/12/2019 Medical Rec #:  [id number]         Height:       67.0 in Accession #:    [id number]        Weight:       198.0 lb Date of Birth:  May 23, 1956         BSA:          2.01 m Patient Age:    21 years          BP:           128/76 mmHg Patient Gender: F                 HR:           108 bpm. Exam Location:  Inpatient Procedure: 2D Echo, Color Doppler and Cardiac Doppler Indications:    R06.9 DOE  History:        Patient has no prior history of Echocardiogram examinations.  Risk Factors:Hypertension and Dyslipidemia.  Sonographer:    Raquel Sarna Senior RDCS Referring Phys: [place]  1. Left ventricular ejection fraction, by visual estimation, is 65 to 70%. The left ventricle has normal function. There is no left ventricular hypertrophy.  2. The left ventricle has no regional wall motion abnormalities.  3. Global right ventricle has normal systolic function.The right ventricular size is mildly enlarged. No increase in right ventricular wall thickness.  4. Left atrial size was normal.  5. Right atrial size was severely dilated.  6. The mitral valve is normal in structure. Mild mitral valve regurgitation. No evidence of mitral stenosis.  7. The tricuspid valve is normal in structure. Tricuspid valve regurgitation is mild.  8. The aortic valve is tricuspid. Aortic valve regurgitation is not visualized. No evidence of aortic valve sclerosis or stenosis.  9. There is severe calcifcation of the aortic valve. 10. The pulmonic valve was normal in structure. Pulmonic valve regurgitation is trivial. 11. Severely elevated pulmonary artery systolic pressure at 10CHEN. 12. The inferior vena cava is normal in size with greater than 50% respiratory variability, suggesting right atrial pressure of 3 mmHg. 13. The interatrial septum appears to be lipomatous. FINDINGS  Left Ventricle: Left ventricular ejection fraction, by visual  estimation, is 65 to 70%. The left ventricle has normal function. The left ventricle has no regional wall motion abnormalities. There is no left ventricular hypertrophy. Normal left atrial pressure. Right Ventricle: The right ventricular size is mildly enlarged. No increase in right ventricular wall thickness. Global RV systolic function is has normal systolic function. The tricuspid regurgitant velocity is 4.00 m/s, and with an assumed right atrial  pressure of 8 mmHg, the estimated right ventricular systolic pressure is severely elevated at 72.0 mmHg. Left Atrium: Left atrial size was normal in size. Right Atrium: Right atrial size was severely dilated Pericardium: There is no evidence of pericardial effusion. Mitral Valve: The mitral valve is normal in structure. Mild mitral valve regurgitation. No evidence of mitral valve stenosis by observation. Tricuspid Valve: The tricuspid valve is normal in structure. Tricuspid valve regurgitation is mild. Aortic Valve: The aortic valve is tricuspid. . There is moderate thickening and severe calcifcation of the aortic valve. Aortic valve regurgitation is not visualized. The aortic valve is structurally normal, with no evidence of sclerosis or stenosis. There is moderate thickening of the aortic valve. There is severe calcifcation of the aortic valve. Pulmonic Valve: The pulmonic valve was normal in structure. Pulmonic valve regurgitation is trivial. Pulmonic regurgitation is trivial. Aorta: The aortic root, ascending aorta and aortic arch are all structurally normal, with no evidence of dilitation or obstruction. Venous: The inferior vena cava is normal in size with greater than 50% respiratory variability, suggesting right atrial pressure of 3 mmHg. IAS/Shunts: Increased thickness of the atrial septum sparing the fossa ovalis consistent with The interatrial septum appears to be lipomatous. No atrial level shunt detected by color flow Doppler. There is no evidence of a  patent foramen ovale. No ventricular septal defect is seen or detected. There is no evidence of an atrial septal defect.  LEFT VENTRICLE PLAX 2D LVIDd:         3.75 cm LVIDs:         1.82 cm LV PW:         1.05 cm LV IVS:        0.94 cm LVOT diam:     2.10 cm LV SV:  50 ml LV SV Index:   23.95 LVOT Area:     3.46 cm  RIGHT VENTRICLE             IVC RV Basal diam:  3.23 cm     IVC diam: 1.44 cm RV Mid diam:    4.81 cm RV Length:      6.81 cm RV S prime:     21.10 cm/s TAPSE (M-mode): 1.8 cm LEFT ATRIUM           Index LA diam:      3.10 cm 1.54 cm/m LA Vol (A2C): 34.1 ml 16.94 ml/m LA Vol (A4C): 46.1 ml 22.90 ml/m  AORTIC VALVE LVOT Vmax:   98.80 cm/s LVOT Vmean:  74.000 cm/s LVOT VTI:    0.192 m  AORTA Ao Root diam: 3.10 cm Ao Asc diam:  3.60 cm TRICUSPID VALVE TR Peak grad:   64.0 mmHg TR Vmax:        400.00 cm/s  SHUNTS Systemic VTI:  0.19 m Systemic Diam: 2.10 cm  Fransico Him MD Electronically signed by Fransico Him MD Signature Date/Time: 06/12/2019/3:15:41 PM    Final    VAS [place] LOWER EXTREMITY VENOUS (DVT)  Result Date: 06/09/2019  Lower Venous Study Indications: Swelling.  Risk Factors: Cancer History of metastatic breast cancer. Comparison Study: No prior study on file Performing Technologist: Sharion Dove RVS  Examination Guidelines: A complete evaluation includes B-mode imaging, spectral Doppler, color Doppler, and power Doppler as needed of all accessible portions of each vessel. Bilateral testing is considered an integral part of a complete examination. Limited examinations for reoccurring indications may be performed as noted.  +---------+---------------+---------+-----------+----------+--------------+ RIGHT    CompressibilityPhasicitySpontaneityPropertiesThrombus Aging +---------+---------------+---------+-----------+----------+--------------+ CFV      Partial        Yes      Yes                  Acute           +---------+---------------+---------+-----------+----------+--------------+ SFJ      Full                                                        +---------+---------------+---------+-----------+----------+--------------+ FV Prox  None                                         Acute          +---------+---------------+---------+-----------+----------+--------------+ FV Mid   None                                         Acute          +---------+---------------+---------+-----------+----------+--------------+ FV DistalNone                                         Acute          +---------+---------------+---------+-----------+----------+--------------+ PFV      Full                                                        +---------+---------------+---------+-----------+----------+--------------+  POP      None           No       No                   Acute          +---------+---------------+---------+-----------+----------+--------------+ PTV      None                                         Acute          +---------+---------------+---------+-----------+----------+--------------+ PERO     None                                         Acute          +---------+---------------+---------+-----------+----------+--------------+ EIV                     Yes      Yes                                 +---------+---------------+---------+-----------+----------+--------------+   +----+---------------+---------+-----------+----------+--------------+ LEFTCompressibilityPhasicitySpontaneityPropertiesThrombus Aging +----+---------------+---------+-----------+----------+--------------+ CFV Full           Yes      Yes                                 +----+---------------+---------+-----------+----------+--------------+     Summary: Right: Findings consistent with acute deep vein thrombosis involving the right common femoral vein, right femoral vein, right popliteal  vein, right posterior tibial veins, and right peroneal veins. Left: No evidence of common femoral vein obstruction.  *See table(s) above for measurements and observations. Electronically signed by Harold Barban MD on 06/09/2019 at 8:27:06 AM.    Final     Microbiology: Recent Results (from the past 240 hour(s))  SARS CORONAVIRUS 2 (TAT 6-24 HRS) Nasopharyngeal Nasopharyngeal Swab     Status: None   Collection Time: 06/28/19  6:22 PM   Specimen: Nasopharyngeal Swab  Result Value Ref Range Status   SARS Coronavirus 2 NEGATIVE NEGATIVE Final    Comment: (NOTE) SARS-CoV-2 target nucleic acids are NOT DETECTED. The SARS-CoV-2 RNA is generally detectable in upper and lower respiratory specimens during the acute phase of infection. Negative results do not preclude SARS-CoV-2 infection, do not rule out co-infections with other pathogens, and should not be used as the sole basis for treatment or other patient management decisions. Negative results must be combined with clinical observations, patient history, and epidemiological information. The expected result is Negative. Fact Sheet for Patients: SugarRoll.be Fact Sheet for Healthcare Providers: https://www.woods-mathews.com/ This test is not yet approved or cleared by the [place] FDA and  has been authorized for detection and/or diagnosis of SARS-CoV-2 by FDA under an Emergency Use Authorization (EUA). This EUA will remain  in effect (meaning this test can be used) for the duration of the COVID-19 declaration under Section 56 4(b)(1) of the Act, 21 U.S.C. section 360bbb-3(b)(1), unless the authorization is terminated or revoked sooner. Performed at [facility], [place] [address] [place], [place] [address]      Labs: Basic Metabolic Panel: Recent Labs  Lab 06/24/19 0500 06/25/19 1019 06/27/19  0865 06/28/19 0453 06/29/19 0518  NA 139 142 143 146* 146*  K 3.4* 3.1* 3.3* 4.1 4.4   CL 108 108 106 107 106  CO2 26 28 32 34* 34*  GLUCOSE 138* 106* 95 100* 85  BUN 11 6* 8 9 13   CREATININE 0.92 0.92 1.23* 1.26* 1.33*  CALCIUM 7.9* 8.0* 8.4* 9.0 9.3  PHOS  --  1.4*  --  2.7 2.6   Liver Function Tests: Recent Labs  Lab 06/25/19 1019 06/27/19 0810 06/28/19 0453 06/29/19 0518  AST  --  28  --   --   ALT  --  23  --   --   ALKPHOS  --  56  --   --   BILITOT  --  0.3  --   --   PROT  --  5.3*  --   --   ALBUMIN 1.8* 1.8* 1.9* 1.9*   No results for input(s): LIPASE, AMYLASE in the last 168 hours. No results for input(s): AMMONIA in the last 168 hours. CBC: Recent Labs  Lab 06/23/19 1436 06/24/19 0500 06/25/19 1019 06/27/19 0810 06/29/19 0523  WBC 13.2* 10.7* 10.9* 10.6* 9.1  NEUTROABS  --   --  8.8*  --  6.4  HGB 9.4* 8.4* 8.4* 7.7* 7.6*  HCT 30.5* 27.9* 27.5* 26.3* 25.6*  MCV 103.7* 104.9* 104.2* 108.2* 108.5*  PLT 256 243 237 211 172   Cardiac Enzymes: No results for input(s): CKTOTAL, CKMB, CKMBINDEX, TROPONINI in the last 168 hours. BNP: BNP (last 3 results) Recent Labs    06/13/19 0432  BNP 171.5*    ProBNP (last 3 results) No results for input(s): PROBNP in the last 8760 hours.  CBG: No results for input(s): GLUCAP in the last 168 hours.     Signed:  Nita Sells MD   [facility] 06/29/2019, 9:04 AM

## 2019-06-29 NOTE — Progress Notes (Signed)
RT called to assess patient due to issues with BIPAP. Per patient's daughter, pt complained of BIPAP pressures being too high.  RN placed patient on 3L [place]. Patient is currently resting comfortably with spo2 98% and no respiratory distress noted. BIPAP is on standby at bedside if needed. RT will continue to monitor as needed.

## 2019-06-29 NOTE — Progress Notes (Signed)
[place] for Lovenox Indication: DVT  Allergies  Allergen Reactions  . Lisinopril Anaphylaxis, Swelling and Other (See Comments)    Swelling of tongue and mouth 11/05/16- tolerates Olmesartan  . Pepcid [Famotidine] Other (See Comments)    PPI H2, BLOCKERS LOWER GASTRIC PH WHICH WOULD LEAD TO SUBTHERAPEUTIC RILPIVIRINE LEVELS AND POTENTIAL VIROLOGICAL FAILURE WITH RESISTANCE  . Prilosec [Omeprazole] Other (See Comments)    PPI H2, BLOCKERS LOWER GASTRIC PH WHICH WOULD LEAD TO SUBTHERAPEUTIC RILPIVIRINE LEVELS AND POTENTIAL VIROLOGICAL FAILURE WITH RESISTANCE  . Tums [Calcium Carbonate Antacid] Other (See Comments)    TUMS ANTACIDS CAN LOWER GASTRIC PH WHICH COULD  LEAD TO SUBTHERAPEUTIC RILPIVIRINE LEVELS AND POTENTIAL VIROLOGICAL FAILURE WITH RESISTANCE TUMS CAN BE GIVEN BUT NEED CONSULT WITH ID PHARMACY RE TIMING. I PREFER HER TO AVOID ALL TOGETHER    Patient Measurements: Height: 5\' 7"  (170.2 cm) Weight: (!) 301 lb (136.5 kg) IBW/kg (Calculated) : 61.6  Vital Signs: Temp: 97.5 F (36.4 C) (01/04 0410) Temp Source: Axillary (01/04 0410) BP: 117/81 (01/04 0410) Pulse Rate: 91 (01/04 0410)  Labs: Recent Labs    06/27/19 0810 06/28/19 0453 06/29/19 0518 06/29/19 0523  HGB 7.7*  --   --  7.6*  HCT 26.3*  --   --  25.6*  PLT 211  --   --  172  CREATININE 1.23* 1.26* 1.33*  --     Estimated Creatinine Clearance: 62.6 mL/min (A) (by C-G formula based on SCr of 1.33 mg/dL (H)).   Medical History: Past Medical History:  Diagnosis Date  . AKI (acute kidney injury) ([place]) 06/02/2019  . Alopecia areata 11/28/2009  . Bell's palsy   . Cancer [facility]) 2005   Breast cancer   chemotherapy and radiation  . CKD (chronic kidney disease) stage 3, GFR 30-59 ml/min 06/08/2015  . Dry eye syndrome   . Family history of lung cancer   . Family history of non-Hodgkin's lymphoma   . Fasting hyperglycemia   . Gestational diabetes    2001  . HIP FRACTURE,  RIGHT 05/06/2008  . History of kidney stones   . HIV DISEASE 03/27/2006  . HYPERLIPIDEMIA, MIXED 12/15/2007  . HYPERTENSION 03/27/2006  . 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 MENORRHAGIA, POSTMENOPAUSAL 02/03/2009  . Osteoarthritis of left knee 06/08/2015  . OSTEOARTHROSIS, LOCAL, SCND, UNSPC SITE 04/07/2007  . Personal history of chemotherapy 11/2018  . PVD 04/07/2007  . Tinea capitis   . TRIGGER FINGER 05/06/2008  . Unspecified vitamin D deficiency 08/06/2007    Assessment: 64 year old female with RLE nonpitting edema and history of metastatic cancer found to have diffuse DVT on LE Dopplers. Pharmacy consulted for Lovenox therapy.   Last LMWH level 1/1 was therapeutic at 1.06. Hgb low but stable at 7.6 this morning, plt wnl. No bleeding issues noted.   Goal of Therapy:  Anti-Xa lovenox level 0.6-1.2 drawn 4 hrs after dose. Monitor platelets by anticoagulation protocol: Yes   Plan:  -Continue lovenox 50 mg q 12 hrs.  Have been determining dosing from LMWH levels instead of weight based dosing, as weight has been questionable this admission.  LMWH levels on traditional weight based dosing were very elevated.  Thank you,   Erin Hearing PharmD., BCPS Clinical Pharmacist 06/29/2019 2:01 PM

## 2019-06-29 NOTE — Plan of Care (Signed)
  Problem: Coping: Goal: Level of anxiety will decrease Outcome: Progressing   Problem: Coping: Goal: Level of anxiety will decrease Outcome: Progressing

## 2019-06-29 NOTE — Telephone Encounter (Signed)
R/s appt per 1/3 sch message - unable to reach pt left message with new appt date and time.

## 2019-06-29 NOTE — TOC Progression Note (Signed)
Transition of Care [facility]) - Progression Note    Patient Details  Name: Kayla Price MRN: [id number] Date of Birth: 1955-08-15  Transition of Care [facility]) CM/SW [place], [place] Phone Number: 06/29/2019, 1:26 PM  Clinical Narrative:     CSW contacted SNFs,[facility] and [facility] has no availability until possibly tomorrow.  Thurmond Butts, MSW, [place] Clinical Social Worker    Expected Discharge Plan: Skilled Nursing Facility Barriers to Discharge: Continued Medical Work up  Expected Discharge Plan and Services Expected Discharge Plan: [place] In-house Referral: Clinical Social Work   Post Acute Care Choice: [place] Living arrangements for the past 2 months: Single Family Home Expected Discharge Date: 06/29/19                                     Social Determinants of Health (SDOH) Interventions    Readmission Risk Interventions Readmission Risk Prevention Plan 06/16/2019  Transportation Screening Complete  Medication Review Press photographer) Complete  PCP or Specialist appointment within 3-5 days of discharge Complete  HRI or Home Care Consult Complete  SW Recovery Care/Counseling Consult Complete  Palliative Care Screening Not [place] Complete  Some recent data might be hidden

## 2019-06-29 NOTE — Progress Notes (Signed)
Physical Therapy Treatment Patient Details Name: Kayla Price MRN: [id number] DOB: 1955-11-30 Today's Date: 06/29/2019    History of Present Illness Kayla Price is a 64 y.o. female with medical history significant of metastatic breast cancer on chemotherapy, HIV disease, hyperlipidemia, chronic kidney disease stage II, hypertension, anemia of chronic disease, protein calorie malnutrition and s/p d/c home after 4 day admissinon for AKI and buttock wound.  She is admitted with continued weakness and with wound that could not be managed at home and urinary retention. Recently has had increased confusion and has been in respiratory acidosis, on and off on BiPAP.    PT Comments    Patient received in bed, more more alert than last time she was seen and agreeable to PT. She does require a great deal more assistance as compared to prior sessions, and needed MaxAx2 for all bed mobility as well as Mod-MaxA to maintain upright at EOB. Easily fatigued but remains motivated. She was left in bed with all needs met this afternoon. Increased frequency back to 2x/week due to improvement in medical status as well as motivation to participate.    Follow Up Recommendations  SNF     Equipment Recommendations  None recommended by PT    Recommendations for Other Services       Precautions / Restrictions Precautions Precautions: Fall;Other (comment) Precaution Comments: watch HR, increased confusion Restrictions Weight Bearing Restrictions: No    Mobility  Bed Mobility Overal bed mobility: Needs Assistance Bed Mobility: Supine to Sit;Sit to Supine     Supine to sit: Max assist;+2 for physical assistance Sit to supine: Max assist;+2 for physical assistance   General bed mobility comments: MaxAx2 for all bed mobility, then Mod-MaxA to maintain upright at EOB for 2-3 minutes  Transfers                 General transfer comment: deferred- fatigue  Ambulation/Gait              General Gait Details: deferred- fatigue   Stairs             Wheelchair Mobility    Modified Rankin (Stroke Patients Only)       Balance Overall balance assessment: Needs assistance Sitting-balance support: Bilateral upper extremity supported;Feet unsupported Sitting balance-Leahy Scale: Poor Sitting balance - Comments: Mod-MaxA for balance at EOB; feet remained unsupported as the linen under her was not in a good enough position for [place] to scoot her forward                                    Cognition Arousal/Alertness: Lethargic Behavior During Therapy: Flat affect Overall Cognitive Status: Difficult to assess                                 General Comments: off of BiPAP, speech soft and difficult to understand, definite need for increased time with command following and processing      Exercises      General Comments General comments (skin integrity, edema, etc.): very easily fatigued      Pertinent Vitals/Pain Pain Assessment: Faces Pain Score: 0-No pain Faces Pain Scale: No hurt Pain Intervention(s): Limited activity within patient's tolerance;Monitored during session    Home Living  Prior Function            PT Goals (current goals can now be found in the care plan section) Acute Rehab PT Goals Patient Stated Goal: to get stronger PT Goal Formulation: With patient Time For Goal Achievement: 07/13/19 Potential to Achieve Goals: Fair Progress towards PT goals: Progressing toward goals    Frequency    Min 2X/week      PT Plan Frequency needs to be updated    Co-evaluation              AM-PAC PT "6 Clicks" Mobility   Outcome Measure  Help needed turning from your back to your side while in a flat bed without using bedrails?: A Lot Help needed moving from lying on your back to sitting on the side of a flat bed without using bedrails?: A Lot Help needed moving to and from a  bed to a chair (including a wheelchair)?: Total Help needed standing up from a chair using your arms (e.g., wheelchair or bedside chair)?: Total Help needed to walk in hospital room?: Total Help needed climbing 3-5 steps with a railing? : Total 6 Click Score: 8    End of Session Equipment Utilized During Treatment: Oxygen Activity Tolerance: Patient tolerated treatment well;Patient limited by fatigue Patient left: in bed;with call bell/phone within reach   PT Visit Diagnosis: Other abnormalities of gait and mobility (R26.89);Muscle weakness (generalized) (M62.81);Pain Pain - part of body: (buttocks and perineum)     Time: 2035-5974 PT Time Calculation (min) (ACUTE ONLY): 13 min  Charges:  $Therapeutic Activity: 8-22 mins                     Windell Norfolk, DPT, PN1   Supplemental Physical Therapist [place]    Pager [phone] Acute Rehab Office [phone]

## 2019-06-29 NOTE — Progress Notes (Signed)
Referral placed for outpatient palliative care consult to be done at SNF- when she is no longer able to rehab, she may need to transition into hospice care.  Lane Hacker, DO Palliative Medicine

## 2019-06-30 ENCOUNTER — Other Ambulatory Visit: Payer: POS

## 2019-06-30 ENCOUNTER — Ambulatory Visit: Payer: Medicare HMO | Admitting: Oncology

## 2019-06-30 ENCOUNTER — Inpatient Hospital Stay (HOSPITAL_COMMUNITY): Payer: Medicare HMO

## 2019-06-30 ENCOUNTER — Ambulatory Visit: Payer: POS

## 2019-06-30 LAB — BLOOD GAS, ARTERIAL
Acid-Base Excess: 10 mmol/L — ABNORMAL HIGH (ref 0.0–2.0)
Bicarbonate: 35.9 mmol/L — ABNORMAL HIGH (ref 20.0–28.0)
Drawn by: 57060
FIO2: 28
O2 Saturation: 93.3 %
Patient temperature: 36.5
pCO2 arterial: 66.2 mmHg (ref 32.0–48.0)
pH, Arterial: 7.35 (ref 7.350–7.450)
pO2, Arterial: 64.1 mmHg — ABNORMAL LOW (ref 83.0–108.0)

## 2019-06-30 MED ORDER — ACETAMINOPHEN 160 MG/5ML PO SOLN
650.0000 mg | ORAL | Status: DC | PRN
  Administered 2019-06-30: 650 mg via ORAL
  Filled 2019-06-30: qty 20.3

## 2019-06-30 MED ORDER — NALOXONE HCL 0.4 MG/ML IJ SOLN
0.4000 mg | INTRAMUSCULAR | Status: DC | PRN
  Administered 2019-06-30: 0.4 mg via INTRAVENOUS
  Filled 2019-06-30: qty 1

## 2019-06-30 MED ORDER — PANTOPRAZOLE SODIUM 20 MG PO TBEC: 20.0000 mg | DELAYED_RELEASE_TABLET | Freq: Every day | ORAL | Status: DC

## 2019-06-30 MED ORDER — DEXAMETHASONE SODIUM PHOSPHATE 4 MG/ML IJ SOLN
4.0000 mg | Freq: Every day | INTRAMUSCULAR | Status: DC
  Administered 2019-06-30 – 2019-07-07 (×8): 4 mg via INTRAVENOUS
  Filled 2019-06-30 (×8): qty 1

## 2019-06-30 MED ORDER — HYDROMORPHONE HCL 1 MG/ML IJ SOLN
0.5000 mg | INTRAMUSCULAR | Status: DC | PRN
  Administered 2019-06-30 – 2019-07-01 (×5): 0.5 mg via INTRAVENOUS
  Filled 2019-06-30 (×5): qty 1

## 2019-06-30 MED ORDER — PANTOPRAZOLE SODIUM 40 MG IV SOLR
40.0000 mg | Freq: Every day | INTRAVENOUS | Status: DC
  Administered 2019-06-30 – 2019-07-01 (×2): 40 mg via INTRAVENOUS
  Filled 2019-06-30 (×2): qty 40

## 2019-06-30 NOTE — Progress Notes (Signed)
hospitalist daily note   Kayla Price [id number] DOB: 1955/11/24 DOA: 06/01/2019  PCP: Lauree Chandler, NP  Narrative:   64 year old black female metastatic breast cancer status post lumpectomy 03/2004 with radical mastectomy 2018-status post Keytruda, Tecentriq with Neulasta--and widely metastatic breast cancer with poor performance Metastatic disease with visual deficits based on intracranial spread and vasogenic edema HIV followed by Dr. 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 secondary to cefepime as per neurology she had EEG on 12/24 on 12/27 showing diffuse encephalopathy initially and then subsequently nonspecific dysfunction She has had a recurrence of her metabolic encephalopathy likely secondary to hypercarbia as below Acute hypercarbic respiratory failure Has been intermittently on BiPAP since 12/27 became further hypercarbic on 1/6 with CO2 in the 60s and not readily intelligible refusing BiPAP Attempted a dose of bicarb which did not really make a huge difference I had an extensive discussion with the patient's daughter this morning 1/5 and explained to her that this is likely going to recur and I  encouraged her to talk to her family members as well as patient's other children about comfort measures and end-of-life care I explained to her that this will likely not resolve and that if we discharged her there is a very high probability she will be readmitted with similar complaints and and worse off state Palliative care is available and will try and follow-up with family members later today 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 Wynelle Cleveland discussed with Dr. 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 Status post whole brain radiation 11/20 chemo and mastectomies  Called and Long discussion with daughter [phone] on 1/5 Palliative care to follow-up as patient declining again lovenox  DNR Disposition unclear  Subjective: Intermittently awake arouses but then seems to fall back to sleep twitching to some degree and perseverates on wanting therapy  Consultants:   CCM Procedures:   Multiple Antimicrobials:   Completed   Objective: Vitals:   06/29/19 1842 06/29/19 1924 06/29/19 2005 06/30/19 0449  BP:   (!) 116/95 133/83  Pulse: (!) 104 86 85 85  Resp: (!) 26 20 18  (!) 22  Temp:   97.9 F (36.6 C) 97.7 F (36.5 C)  TempSrc:   Axillary Axillary  SpO2: 97% 100% 98% 97%  Weight:    (!) 136.5 kg  Height:        Intake/Output Summary (Last 24 hours) at 06/30/2019 1412 Last data filed  at 06/30/2019 0910 Gross per 24 hour  Intake 0 ml  Output --  Net 0 ml   Filed  Weights   06/28/19 0627 06/29/19 0410 06/30/19 0449  Weight: 135.2 kg (!) 136.5 kg (!) 136.5 kg    Examination: Cachectic in some minor cardiorespiratory distress EOMI NCAT Accessory muscles of breathing in use Chest clear no rales no rhonchi Abdomen soft No lower extremity edema  Scheduled Meds: . Chlorhexidine Gluconate Cloth  6 each Topical Daily  . darunavir-cobicistat  1 tablet Oral QHS  . dolutegravir  50 mg Oral QHS   And  . rilpivirine  25 mg Oral QHS  . enoxaparin (LOVENOX) injection  50 mg Subcutaneous Q12H  . feeding supplement (ENSURE ENLIVE)  237 mL Oral TID BM  . levETIRAcetam  250 mg Oral BID  . levothyroxine  200 mcg Oral Q0600  . multivitamin with minerals  1 tablet Oral Daily  . potassium chloride  40 mEq Oral Daily   Continuous Infusions: . sodium chloride Stopped (06/22/19 0036)     LOS: 27 days   Time spent: [place], MD [facility]

## 2019-06-30 NOTE — Progress Notes (Signed)
Called and updated daughter, [name], three times today about patient's condition.

## 2019-06-30 NOTE — Progress Notes (Signed)
Nutrition Follow-up  DOCUMENTATION CODES:   Non-severe (moderate) malnutrition in context of chronic illness  INTERVENTION:   -Ensure Enlive poTID, each supplement provides 350 kcal and 20 grams of protein -Multivitamin with minerals daily  NUTRITION DIAGNOSIS:   Moderate Malnutrition related to chronic illness(metastatic breast cancer) as evidenced by mild fat depletion, severe muscle depletion, mild muscle depletion, percent weight loss.  Ongoing.  GOAL:   Patient will meet greater than or equal to 90% of their needs  Not meeting.  MONITOR:   PO intake, Supplement acceptance, Labs, Weight trends  ASSESSMENT:   64 year old patient admitted with weakness and wound that could not be managed at home. PMH of metastatic breast cancer on chemo, HIV, HLD, HTN, AKI, anemia of chronic disease and CKD stage 3. Pt found to have moisture associated skin breakdown.  **RD working remotely**  Patient's PO has been 0-25%. Pt has consumed nothing today given confusion. Pt had been drinking some Ensure supplements.   Per MD note, Palliative care to speak with pt's family regarding GOC.  Admission weight: 199 lbs. Current weight: 301 lbs.  I/Os: -3.2L since admit  Medications: Multivitamin with minerals daily  Labs reviewed: Elevated Na  Diet Order:   Diet Order            Diet - low sodium heart healthy        DIET DYS 3 Room service appropriate? Yes; Fluid consistency: Thin  Diet effective now              EDUCATION NEEDS:   Education needs have been addressed  Skin:  Skin Assessment: Skin Integrity Issues: Skin Integrity Issues:: Other (Comment), Stage II Stage II: buttocks x2 Other: non-pressure wound- R/L groin  Last BM:  12/29 -type 1  Height:   Ht Readings from Last 1 Encounters:  06/19/19 5\' 7"  (1.702 m)    Weight:   Wt Readings from Last 1 Encounters:  06/30/19 (!) 136.5 kg    Ideal Body Weight:  61.4 kg  BMI:  Body mass index is 47.14  kg/m.  Estimated Nutritional Needs:   Kcal:  2000-2200  Protein:  100-110  Fluid:  > 2L   Clayton Bibles, MS, RD, LDN Inpatient Clinical Dietitian Pager: [phone] After Hours Pager: [phone]

## 2019-06-30 NOTE — Progress Notes (Signed)
Notified by lab of critical value- pCO2 66.2. MD notified, patient pulled off [place]. Narcan ordered, patient remained restless. RT returned and was able to place Bipap. Patient still restless and pulling at [place].  No changes in mental status after 2 hours of Bipap and narcan, patient is still restless. Bipap removed and placed back on 3L nasal canula

## 2019-06-30 NOTE — Progress Notes (Signed)
Communicated with Patient's daughter after MD Spoke with her.  She had no questions about her mother and simply wanted to know my name.    Multiple attempts were given to prompt questions; however, patient's daughter was interested in getting names of staff who worked last night.

## 2019-06-30 NOTE — Consult Note (Addendum)
Palliative Care Consult Note  Inpatient palliative care consult requested due to change in status. I have seen and examined Kayla Price, she is awake and alert, she complains of pain in her back and appears to be in moderate discomfort related to pain. Spoke extensively with patient's daughter and the patient's sister by phone this evening. We discussed prognosis, goals of care, medical treatment and intervention options and consideration was given to how this patient would want to spend the time that she has left. We also discussed the point at which hospice would be appropriate and how fragile she is medically.  Summary of Goals and Recommendations:  1. Pain control is primary goal based on our conversation this evening. I discussed the role and use of opioids for cancer related pain and shortness of breath. Additionally I recommend adding a steroid to help with inflammation. She is on anticoagulation w/DVT but benefits outweigh risks. Will use dexamethasone and hydromorphone.  2. Prognosis has not been shared with her daughter directly (I discussed how fragile she is and that she could in fact die of her cancer at anytime despite all we are doing and that there is no cure, she is not strong enough to receive chemotherapy but at best she could have several weeks. I shared with her daughter that when her mother had whole brain radiation in November-at that time her median survival was only 3 months with treatment- we are nearing the third month.   3. Disposition issues: I reassured her daughter we wanted to make sure we had the right plan in place- her daughter wants to meet tomorrow to try to engage her mother in helping with the decisions facing them including possible hospice options will meet her at 615PM 1/6.   4. Discontinue Bipap, not adding comfort or improving her condition- not a sustainable intervention long term.  Lane Hacker, DO Palliative Medicine [phone]  Time: 70  minutes Greater than 50%  of this time was spent counseling and coordinating care related to the above assessment and plan.

## 2019-06-30 NOTE — Progress Notes (Signed)
Narcan administered.  Patient remains resltess.  Informed RN that Bipap will be attempted as discussed with Dr. Verlon Au.  Sitter requested.

## 2019-06-30 NOTE — Progress Notes (Signed)
Xray being taken prior to ABG and results shared with MD.  ABG drawn on 3 liters.    Attempted to place patient on bipap; however she kept screaming and ripped the mask off.    ABG will be sent to lab.

## 2019-07-01 LAB — BASIC METABOLIC PANEL
Anion gap: 9 (ref 5–15)
BUN: 18 mg/dL (ref 8–23)
CO2: 36 mmol/L — ABNORMAL HIGH (ref 22–32)
Calcium: 9.1 mg/dL (ref 8.9–10.3)
Chloride: 105 mmol/L (ref 98–111)
Creatinine, Ser: 1.35 mg/dL — ABNORMAL HIGH (ref 0.44–1.00)
GFR calc Af Amer: 48 mL/min — ABNORMAL LOW (ref >60)
GFR calc non Af Amer: 42 mL/min — ABNORMAL LOW (ref >60)
Glucose, Bld: 96 mg/dL (ref 70–99)
Potassium: 4.5 mmol/L (ref 3.5–5.1)
Sodium: 150 mmol/L — ABNORMAL HIGH (ref 135–145)

## 2019-07-01 LAB — CBC WITH DIFFERENTIAL/PLATELET
Abs Immature Granulocytes: 0.09 10*3/uL — ABNORMAL HIGH (ref 0.00–0.07)
Basophils Absolute: 0 10*3/uL (ref 0.0–0.1)
Basophils Relative: 0 %
Eosinophils Absolute: 0 10*3/uL (ref 0.0–0.5)
Eosinophils Relative: 0 %
HCT: 29.4 % — ABNORMAL LOW (ref 36.0–46.0)
Hemoglobin: 8.2 g/dL — ABNORMAL LOW (ref 12.0–15.0)
Immature Granulocytes: 1 %
Lymphocytes Relative: 8 %
Lymphs Abs: 0.7 10*3/uL (ref 0.7–4.0)
MCH: 32.2 pg (ref 26.0–34.0)
MCHC: 27.9 g/dL — ABNORMAL LOW (ref 30.0–36.0)
MCV: 115.3 fL — ABNORMAL HIGH (ref 80.0–100.0)
Monocytes Absolute: 0.3 10*3/uL (ref 0.1–1.0)
Monocytes Relative: 3 %
Neutro Abs: 8.3 10*3/uL — ABNORMAL HIGH (ref 1.7–7.7)
Neutrophils Relative %: 88 %
Platelets: 195 10*3/uL (ref 150–400)
RBC: 2.55 MIL/uL — ABNORMAL LOW (ref 3.87–5.11)
RDW: 24.2 % — ABNORMAL HIGH (ref 11.5–15.5)
WBC: 9.4 10*3/uL (ref 4.0–10.5)
nRBC: 0.3 % — ABNORMAL HIGH (ref 0.0–0.2)

## 2019-07-01 MED ORDER — LEVETIRACETAM IN NACL 500 MG/100ML IV SOLN
500.0000 mg | Freq: Two times a day (BID) | INTRAVENOUS | Status: DC
  Administered 2019-07-01 – 2019-07-07 (×13): 500 mg via INTRAVENOUS
  Filled 2019-07-01 (×14): qty 100

## 2019-07-01 MED ORDER — HYDROMORPHONE BOLUS VIA INFUSION
1.0000 mg | INTRAVENOUS | Status: DC | PRN
  Administered 2019-07-02 – 2019-07-07 (×27): 1 mg via INTRAVENOUS
  Filled 2019-07-01: qty 1

## 2019-07-01 MED ORDER — SODIUM CHLORIDE 0.9 % IV SOLN
0.1000 mg/h | INTRAVENOUS | Status: DC
  Administered 2019-07-01 – 2019-07-02 (×2): 0.25 mg/h via INTRAVENOUS
  Administered 2019-07-05: 1 mg/h via INTRAVENOUS
  Administered 2019-07-06: 0.1 mg/h via INTRAVENOUS
  Filled 2019-07-01 (×4): qty 2.5

## 2019-07-01 MED ORDER — HYDROMORPHONE HCL 1 MG/ML IJ SOLN
1.0000 mg | INTRAMUSCULAR | Status: AC
  Administered 2019-07-01: 1 mg via INTRAVENOUS
  Filled 2019-07-01: qty 1

## 2019-07-01 NOTE — Progress Notes (Signed)
Palliative Care Follow-up  I met with patient and her daughter this evening at bedside and conferenced in remotely her son who is in [place] and her sister Stanton Kidney who lives here locally. Ms. Leverett was visibly having severe pain, holding her chest and moaning. Tonight this family came together to discuss and find clarity and acceptance over their mother and sisters condition. Ms. Giebler was hearing the conversation and could answer simple questions. I took time to mostly just listen to their concerns and provide information about he current condition and historical events to the degree I could share based on the medical record documentation. I explained the cancer progression, the locations of her mets and masses including the large mass in her right chest over where she was holding her chest-the family was not fully aware of the degree of the cancer spread and tonight after seeing how rapidly her condition has deteriorated were able to understand better that EOL was near. I gently but directly told Ms. Yglesias and her family that she was dying. Ms. Hendon was able to nod that she understood. I also asked Ms. Odoherty if she wanted aggressive pain control even if that meant she was sedated -she responded yes. She was also able to tell her children that she loved them and they were able to talk about spiritual healing and that god would take her home when it was her time. I reassured them that I believed that she has and is receiving the very best medical care that we have to offer and that her cancer could not be slowed or reversed and how very sorry I was that this has happened to her. I recommended comfort care as really the only path forward at this point- she was suffering and all agreed we must get her pain under control as our primary goal.   I discussed in detail what a comfort care designation would mean in terms of how her medical care would be delivered and also discussed hospice options. Her son is  leaving [place] in AM and will arrive to [facility] on Thursday evening. Given her endorsement of comfort care and that she is approaching EOL, visitation under the [facility] EOL guidelines should be allowed.  Recommendations:  Comfort Care is primary goal, continue other supportive essential meds as tolerated. Initiate hydromorphone infusion with PRN bolus doses for pain Maintained dexmethasone and keppra for seizure prophylaxis Patient's daughter Olivia Mackie specifically requests that Dr. Jana Hakim speak with them about her care- I will reach out to him ASAP in AM. [place] visitation- 4 names placed in orders. Prognosis is hours to days, suspect less than 2 weeks. We will address hospice options depending on her condition and ability to transport.  Lane Hacker, DO Palliative Medicine [phone]  Time: 50 min Greater than 50%  of this time was spent counseling and coordinating care related to the above assessment and plan.

## 2019-07-01 NOTE — Progress Notes (Signed)
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 She was admitted for hypotension, acute kidney injury and ulcers of her groin and perineum. 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 Hospital course complicated by community-acquired pneumonia, extensive right lower extremity DVT, acute hypoxic respiratory failure. She was transferred to progressive care unit on 12/17, PCCM consulted, BiPAP initiated, primary oncologist and PMT consulted, since she was doing so poorly, discussions regarding transitioning to comfort care were ongoing.  She was able to be weaned off of BiPAP to room air (as low at 2-4 L; at rest) however she developed acute onset of confusion on 12/24. This was determined to be Cefepime toxicity. She was treated with PRN Ativan and Morphine. She developed hypercarbic respiratory failure and required a BiPAP, this time to correct her hypercarbia.  -Multiple discussions with palliative care, family wanting rehab however patient continues to decline and is not in any position for rehabilitation  Assessment & Plan:   Acute on chronic hypercarbic respiratory failure -Multifactorial, OSA/OHS, pneumonia -Worsened by pain meds -BiPAP discontinued 1/5, palliative following -Completed antibiotic treatment for pneumonia -Ideally needs residential hospice -Greatly appreciate palliative input, plan for another family meeting  today  Metabolic encephalopathy -Secondary to above, worsened by cefepime, pain meds --ECG 12/24 showed diffuse encephalopathy, no epileptiform activity -Had been refusing BiPAP on multiple locations which was contributing to her hypercarbia -Seen by palliative care, multiple discussions recommending comfort care, end-of-life  Metastatic breast cancer with brain metastasis -History of lumpectomy, followed by radical mastectomy in 2018, status post chemo -Now with very poor performance status -Followed by Dr. 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 intake  Extensive acute left leg DVT -On full dose Lovenox  Anemia of chronic disease, acute illness -Worsened in the setting of chemotherapy  DVT prophylaxis: Full dose Lovenox Code Status: DNR Family Communication: No family at bedside, will contact daughter later today or tomorrow Disposition Plan: To be determined  Consultants:   PCCM  Oncology  Palliative   Procedures:   Antimicrobials:    Subjective: -Remains poorly interactive, in pain, minimal p.o. intake  Objective: Vitals:   06/30/19 2037 06/30/19 2043 07/01/19 0442 07/01/19 1406  BP: 108/74  112/68 (!) 135/91  Pulse: 86 91 78 84  Resp: 17 20 (!) 27 18  Temp: 98.2 F (36.8 C)  98.1 F (36.7 C) 97.7 F (36.5 C)  TempSrc: Oral  Oral Oral  SpO2:  99% 100% 94%  Weight:  Height:         Intake/Output Summary (Last 24 hours) at 07/01/2019 1543 Last data filed at 07/01/2019 1500 Gross per 24 hour  Intake 0 ml  Output --  Net 0 ml   Filed Weights   06/28/19 0627 06/29/19 0410 06/30/19 0449  Weight: 135.2 kg (!) 136.5 kg (!) 136.5 kg    Examination:  General exam: Elderly chronically ill female, laying in bed, somnolent, arousable, mumbles Respiratory system: Poor air movement, conducted upper airway sounds Cardiovascular system: S1 & S2 heard, RRR. Gastrointestinal system: Abdomen is nondistended, soft and nontender.Normal bowel sounds heard. Central nervous system: Somnolent, arousable. Extremities: Trace edema Skin: Genital rashes appear to have improved Psychiatry: Unable to assess   Data Reviewed:   CBC: Recent Labs  Lab 06/25/19 1019 06/27/19 0810 06/29/19 0523 07/01/19 0442  WBC 10.9* 10.6* 9.1 9.4  NEUTROABS 8.8*  --  6.4 8.3*  HGB 8.4* 7.7* 7.6* 8.2*  HCT 27.5* 26.3* 25.6* 29.4*  MCV 104.2* 108.2* 108.5* 115.3*  PLT 237 211 172 253   Basic Metabolic Panel: Recent Labs  Lab 06/25/19 1019 06/27/19 0810 06/28/19 0453 06/29/19 0518 07/01/19 0442  NA 142 143 146* 146* 150*  K 3.1* 3.3* 4.1 4.4 4.5  CL 108 106 107 106 105  CO2 28 32 34* 34* 36*  GLUCOSE 106* 95 100* 85 96  BUN 6* 8 9 13 18   CREATININE 0.92 1.23* 1.26* 1.33* 1.35*  CALCIUM 8.0* 8.4* 9.0 9.3 9.1  PHOS 1.4*  --  2.7 2.6  --    GFR: Estimated Creatinine Clearance: 61.7 mL/min (A) (by C-G formula based on SCr of 1.35 mg/dL (H)). Liver Function Tests: Recent Labs  Lab 06/25/19 1019 06/27/19 0810 06/28/19 0453 06/29/19 0518  AST  --  28  --   --   ALT  --  23  --   --   ALKPHOS  --  56  --   --   BILITOT  --  0.3  --   --   PROT  --  5.3*  --   --   ALBUMIN 1.8* 1.8* 1.9* 1.9*   No results for input(s): LIPASE, AMYLASE in the last 168 hours. No results for input(s): AMMONIA in the last 168 hours. Coagulation Profile: No results for input(s): INR, PROTIME in the  last 168 hours. Cardiac Enzymes: No results for input(s): CKTOTAL, CKMB, CKMBINDEX, TROPONINI in the last 168 hours. BNP (last 3 results) No results for input(s): PROBNP in the last 8760 hours. HbA1C: No results for input(s): HGBA1C in the last 72 hours. CBG: No results for input(s): GLUCAP in the last 168 hours. Lipid Profile: No results for input(s): CHOL, HDL, LDLCALC, TRIG, CHOLHDL, LDLDIRECT in the last 72 hours. Thyroid Function Tests: No results for input(s): TSH, T4TOTAL, FREET4, T3FREE, THYROIDAB in the last 72 hours. Anemia Panel: No results for input(s): VITAMINB12, FOLATE, FERRITIN, TIBC, IRON, RETICCTPCT in the last 72 hours. Urine analysis:    Component Value Date/Time   COLORURINE AMBER (A) 06/18/2019 1112   APPEARANCEUR CLOUDY (A) 06/18/2019 1112   LABSPEC 1.020 06/18/2019 1112   PHURINE 5.0 06/18/2019 1112   GLUCOSEU NEGATIVE 06/18/2019 1112   GLUCOSEU NEG mg/dL 07/23/2006 2113   HGBUR MODERATE (A) 06/18/2019 1112   BILIRUBINUR NEGATIVE 06/18/2019 1112   KETONESUR 5 (A) 06/18/2019 1112   PROTEINUR 100 (A) 06/18/2019 1112   UROBILINOGEN 1 07/23/2006 2113   NITRITE NEGATIVE 06/18/2019 1112   LEUKOCYTESUR NEGATIVE 06/18/2019 1112   Sepsis  Labs: @LABRCNTIP (procalcitonin:4,lacticidven:4)  ) Recent Results (from the past 240 hour(s))  SARS CORONAVIRUS 2 (TAT 6-24 HRS) Nasopharyngeal Nasopharyngeal Swab     Status: None   Collection Time: 06/28/19  6:22 PM   Specimen: Nasopharyngeal Swab  Result Value Ref Range Status   SARS Coronavirus 2 NEGATIVE NEGATIVE Final    Comment: (NOTE) SARS-CoV-2 target nucleic acids are NOT DETECTED. The SARS-CoV-2 RNA is generally detectable in upper and lower respiratory specimens during the acute phase of infection. Negative results do not preclude SARS-CoV-2 infection, do not rule out co-infections with other pathogens, and should not be used as the sole basis for treatment or other patient management decisions. Negative  results must be combined with clinical observations, patient history, and epidemiological information. The expected result is Negative. Fact Sheet for Patients: SugarRoll.be Fact Sheet for Healthcare Providers: https://www.woods-mathews.com/ This test is not yet approved or cleared by the [place] FDA and  has been authorized for detection and/or diagnosis of SARS-CoV-2 by FDA under an Emergency Use Authorization (EUA). This EUA will remain  in effect (meaning this test can be used) for the duration of the COVID-19 declaration under Section 56 4(b)(1) of the Act, 21 U.S.C. section 360bbb-3(b)(1), unless the authorization is terminated or revoked sooner. Performed at [facility], [place] [address] [place], [place] [address]          Radiology Studies: DG CHEST PORT 1 VIEW  Result Date: 06/30/2019 CLINICAL DATA:  Pneumonia.  History of breast cancer. EXAM: PORTABLE CHEST 1 VIEW COMPARISON:  06/24/2019. FINDINGS: PowerPort catheter noted with tip over superior vena cava in stable position. Stable cardiomegaly. Diffuse right lung infiltrate/consolidation again noted. Right pleural effusion again noted. No interim change. Stable mild left mid lung atelectasis/infiltrate. No pneumothorax. IMPRESSION: 1.  PowerPort catheter in stable position. 2.  Stable cardiomegaly. 3. Diffuse right lung infiltrate/consolidation again and prominent right pleural effusion again noted without interim change. Stable mild left mid lung atelectasis/infiltrate. Electronically Signed   By: Marcello Moores  Register   On: 06/30/2019 08:40        Scheduled Meds: . Chlorhexidine Gluconate Cloth  6 each Topical Daily  . darunavir-cobicistat  1 tablet Oral QHS  . dexamethasone (DECADRON) injection  4 mg Intravenous Daily  . dolutegravir  50 mg Oral QHS   And  . rilpivirine  25 mg Oral QHS  . enoxaparin (LOVENOX) injection  50 mg Subcutaneous Q12H  . feeding supplement  (ENSURE ENLIVE)  237 mL Oral TID BM  . levETIRAcetam  250 mg Oral BID  . levothyroxine  200 mcg Oral Q0600  . multivitamin with minerals  1 tablet Oral Daily  . pantoprazole (PROTONIX) IV  40 mg Intravenous QHS  . potassium chloride  40 mEq Oral Daily   Continuous Infusions: . sodium chloride Stopped (06/22/19 0036)     LOS: 28 days    Time spent: 33min  Domenic Polite, MD [facility]  07/01/2019, 3:43 PM

## 2019-07-02 ENCOUNTER — Telehealth: Payer: Self-pay

## 2019-07-02 NOTE — Care Management Important Message (Signed)
Important Message  Patient Details  Name: Kayla Price MRN: [id number] Date of Birth: May 24, 1956   Medicare Important Message Given:  Yes     Shelda Altes 07/02/2019, 2:42 PM

## 2019-07-02 NOTE — Progress Notes (Signed)
PT Cancellation Note  Patient Details Name: Kayla Price MRN: [id number] DOB: [birth year]   Cancelled Treatment:    Reason Eval/Treat Not Completed: Medical issues which prohibited therapy per chart review, patient has appeared to further deteriorate medically and per MD and palliative notes is likely at end of life.    Discussed case with attending MD, team in agreement to discontinue PT services at this point.  Thank you for the opportunity to participate in the care of this patient.    Windell Norfolk, DPT, PN1   Supplemental Physical Therapist [facility]    Pager [phone] Acute Rehab Office [phone]

## 2019-07-02 NOTE — Progress Notes (Signed)
PROGRESS NOTE    Kayla Price  BMW:[id number] DOB: [birth year] DOA: 06/01/2019 PCP: Lauree Chandler, NP  Brief Narrative: Kayla Price 64 year old female with PMH of metastatic breast cancer with metastasis to brain and right pleura, s/p whole brain radiation, HIV, CKD stage II, recently hospitalized 05/27/2019-05/31/2023 acute kidney injury, hypotension and superficial ulcerations of the groin/perineum who presented the next day of discharge 06/01/2019 due to worsening drainage from her known skin ulcerations along with generalized weakness. She was admitted for hypotension, acute kidney injury and ulcers of her groin and perineum. 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 Hospital course complicated by community-acquired pneumonia, extensive right lower extremity DVT, acute hypoxic respiratory failure. She was transferred to progressive care unit on 12/17, PCCM consulted, BiPAP initiated, primary oncologist and PMT consulted, since she was doing so poorly, discussions regarding transitioning to comfort care were ongoing.  She was able to be weaned off of BiPAP to room air (as low at 2-4 L; at rest) however she developed acute onset of confusion on 12/24. This was determined to be Cefepime toxicity. She was treated with PRN Ativan and Morphine. She developed hypercarbic respiratory failure and required a BiPAP, this time to correct her hypercarbia.  -Multiple discussions with palliative care, family was originally wanting rehab however patient continues to decline and is not in any position for rehabilitation -Patient continued to decline with ongoing failure to thrive, recurrent respiratory failure, severe pain secondary to her metastasis, minimal p.o. 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 Hilma Favors, now comfort care on a Dilaudid drip -Anticipate hospital demise  Metabolic encephalopathy -Secondary to above, worsened by cefepime, pain meds --ECG 12/24 showed diffuse encephalopathy, no epileptiform activity -Had been refusing BiPAP on multiple locations which was contributing to her hypercarbia -Seen by palliative care, multiple discussions recommending, now end-of-life, comfort care  Metastatic breast cancer with brain metastasis -History of lumpectomy, followed by radical mastectomy in 2018, status post chemo -Now with very poor performance status -Followed by Dr. 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 intake  Extensive acute left leg DVT -On full dose Lovenox  Anemia of chronic disease, acute illness -Worsened in the setting of chemotherapy  DVT prophylaxis: Full dose Lovenox Code Status: DNR Family Communication: No family at bedside this morning  disposition Plan: Anticipate the hospital demise versus residential hospice  Consultants:   PCCM  Oncology  Palliative   Procedures:   Antimicrobials:    Subjective: -Continues to decline, now on a Dilaudid drip  Objective: Vitals:   06/30/19 2043 07/01/19 0442 07/01/19 1406 07/02/19 1435  BP:  112/68 (!) 135/91 96/63  Pulse: 91 78  84 94  Resp: 20 (!) 27 18   Temp:  98.1 F (36.7 C) 97.7  F (36.5 C) 98.9 F (37.2 C)  TempSrc:  Oral Oral Oral  SpO2: 99% 100% 94% 98%  Weight:      Height:       No intake or output data in the 24 hours ending 07/02/19 1607 Filed Weights   06/28/19 0627 06/29/19 0410 06/30/19 0449  Weight: 135.2 kg (!) 136.5 kg (!) 136.5 kg    Examination:  General exam: Elderly chronically ill female, poorly responsive, moans  Respiratory system: Poor air movement, conducted upper airway sounds Cardiovascular system: Distant heart sounds. Gastrointestinal system: Abdomen is nondistended, soft and nontender.Normal bowel sounds heard. Central nervous system: Somnolent, arousable. Extremities: Trace edema Psychiatry: Unable to assess   Data Reviewed:   CBC: Recent Labs  Lab 06/27/19 0810 06/29/19 0523 07/01/19 0442  WBC 10.6* 9.1 9.4  NEUTROABS  --  6.4 8.3*  HGB 7.7* 7.6* 8.2*  HCT 26.3* 25.6* 29.4*  MCV 108.2* 108.5* 115.3*  PLT 211 172 144   Basic Metabolic Panel: Recent Labs  Lab 06/27/19 0810 06/28/19 0453 06/29/19 0518 07/01/19 0442  NA 143 146* 146* 150*  K 3.3* 4.1 4.4 4.5  CL 106 107 106 105  CO2 32 34* 34* 36*  GLUCOSE 95 100* 85 96  BUN 8 9 13 18   CREATININE 1.23* 1.26* 1.33* 1.35*  CALCIUM 8.4* 9.0 9.3 9.1  PHOS  --  2.7 2.6  --    GFR: Estimated Creatinine Clearance: 61.7 mL/min (A) (by C-G formula based on SCr of 1.35 mg/dL (H)). Liver Function Tests: Recent Labs  Lab 06/27/19 0810 06/28/19 0453 06/29/19 0518  AST 28  --   --   ALT 23  --   --   ALKPHOS 56  --   --   BILITOT 0.3  --   --   PROT 5.3*  --   --   ALBUMIN 1.8* 1.9* 1.9*   No results for input(s): LIPASE, AMYLASE in the last 168 hours. No results for input(s): AMMONIA in the last 168 hours. Coagulation Profile: No results for input(s): INR, PROTIME in the last 168 hours. Cardiac Enzymes: No results for input(s): CKTOTAL, CKMB, CKMBINDEX, TROPONINI in the last 168 hours. BNP  (last 3 results) No results for input(s): PROBNP in the last 8760 hours. HbA1C: No results for input(s): HGBA1C in the last 72 hours. CBG: No results for input(s): GLUCAP in the last 168 hours. Lipid Profile: No results for input(s): CHOL, HDL, LDLCALC, TRIG, CHOLHDL, LDLDIRECT in the last 72 hours. Thyroid Function Tests: No results for input(s): TSH, T4TOTAL, FREET4, T3FREE, THYROIDAB in the last 72 hours. Anemia Panel: No results for input(s): VITAMINB12, FOLATE, FERRITIN, TIBC, IRON, RETICCTPCT in the last 72 hours. Urine analysis:    Component Value Date/Time   COLORURINE AMBER (A) 06/18/2019 1112   APPEARANCEUR CLOUDY (A) 06/18/2019 1112   LABSPEC 1.020 06/18/2019 1112   PHURINE 5.0 06/18/2019 1112   GLUCOSEU NEGATIVE 06/18/2019 1112   GLUCOSEU NEG mg/dL 07/23/2006 2113   HGBUR MODERATE (A) 06/18/2019 1112   BILIRUBINUR NEGATIVE 06/18/2019 1112   KETONESUR 5 (A) 06/18/2019 1112   PROTEINUR 100 (A) 06/18/2019 1112   UROBILINOGEN 1 07/23/2006 2113   NITRITE NEGATIVE 06/18/2019 1112   LEUKOCYTESUR NEGATIVE 06/18/2019 1112   Sepsis Labs: @LABRCNTIP (procalcitonin:4,lacticidven:4)  ) Recent Results (from the past 240 hour(s))  SARS CORONAVIRUS 2 (TAT 6-24 HRS) Nasopharyngeal Nasopharyngeal Swab     Status: None   Collection Time: 06/28/19  6:22 PM   Specimen: Nasopharyngeal Swab  Result Value  Ref Range Status   SARS Coronavirus 2 NEGATIVE NEGATIVE Final    Comment: (NOTE) SARS-CoV-2 target nucleic acids are NOT DETECTED. The SARS-CoV-2 RNA is generally detectable in upper and lower respiratory specimens during the acute phase of infection. Negative results do not preclude SARS-CoV-2 infection, do not rule out co-infections with other pathogens, and should not be used as the sole basis for treatment or other patient management decisions. Negative results must be combined with clinical observations, patient history, and epidemiological information. The expected result is  Negative. Fact Sheet for Patients: SugarRoll.be Fact Sheet for Healthcare Providers: https://www.woods-mathews.com/ This test is not yet approved or cleared by the [place] FDA and  has been authorized for detection and/or diagnosis of SARS-CoV-2 by FDA under an Emergency Use Authorization (EUA). This EUA will remain  in effect (meaning this test can be used) for the duration of the COVID-19 declaration under Section 56 4(b)(1) of the Act, 21 U.S.C. section 360bbb-3(b)(1), unless the authorization is terminated or revoked sooner. Performed at [facility], [place] [address], [place], [place] [address]          Radiology Studies: No results found.      Scheduled Meds: . Chlorhexidine Gluconate Cloth  6 each Topical Daily  . dexamethasone (DECADRON) injection  4 mg Intravenous Daily  . enoxaparin (LOVENOX) injection  50 mg Subcutaneous Q12H  . feeding supplement (ENSURE ENLIVE)  237 mL Oral TID BM  . pantoprazole (PROTONIX) IV  40 mg Intravenous QHS  . potassium chloride  40 mEq Oral Daily   Continuous Infusions: . sodium chloride Stopped (06/22/19 0036)  . HYDROmorphone 0.25 mg/hr (07/02/19 1357)  . levETIRAcetam 500 mg (07/02/19 1235)     LOS: 29 days    Time spent: 71min  Domenic Polite, MD [facility]  07/02/2019, 4:07 PM

## 2019-07-02 NOTE — Progress Notes (Signed)
I responded to consult for spiritual support. [name] is unresponsive and no family present at this time. I was pastorally present and offered silent prayer and scripture reading. Will follow up as needed.    Palliative Care  Chaplain Resident Kayla Price  [phone]

## 2019-07-02 NOTE — Telephone Encounter (Signed)
Phone call placed to patient's daughter,[name], regarding referral to Palliative Care. [name] shared that patient is in the hospital and is being transitioned to comfort care only. Per [name], plan is to have patient transferred to a hospice home. [name] shared that she has a 64 year old at home who is very close with patient. Provided information on [facility].

## 2019-07-02 NOTE — Progress Notes (Signed)
Palliative Care Follow-up Note  Patient seen this evening and daughter and grandaughter were at bedside. Patient was very comfortable -she appeared to be very deeply sleeping her respiration pattern was regular and unlabored. Given her struggle for the past week she is now with with adequate pain control resting comfortably. Her pupils are not pinpoint and I did not try to vigorously arouse her. Her daughter asks if we can reduce her infusion so that she is able to be more awake when her son comes to visit. She also does not want her mom to suffer but wants to at least see if she can "wake up" just a little bit more.This is a reasonable request and I am open to trial of reduced dose and see how she does-she has back up prn bolus that can be given if pain or discomfort return.  Dr. Jana Hakim spoke with daughter this evening and she expressed appreciation for that call and closure.  Will continue to support Ms. Szydlowski and her family through this difficult time.  Lane Hacker, DO Palliative Medicine [phone]  Time: 35 min Greater than 50%  of this time was spent counseling and coordinating care related to the above assessment and plan.

## 2019-07-02 NOTE — Telephone Encounter (Signed)
Palliative team and Hospice liaison team updated on patient.

## 2019-07-02 NOTE — Progress Notes (Signed)
[place] for Lovenox Indication: DVT  Allergies  Allergen Reactions  . Lisinopril Anaphylaxis, Swelling and Other (See Comments)    Swelling of tongue and mouth 11/05/16- tolerates Olmesartan  . Pepcid [Famotidine] Other (See Comments)    PPI H2, BLOCKERS LOWER GASTRIC PH WHICH WOULD LEAD TO SUBTHERAPEUTIC RILPIVIRINE LEVELS AND POTENTIAL VIROLOGICAL FAILURE WITH RESISTANCE  . Prilosec [Omeprazole] Other (See Comments)    PPI H2, BLOCKERS LOWER GASTRIC PH WHICH WOULD LEAD TO SUBTHERAPEUTIC RILPIVIRINE LEVELS AND POTENTIAL VIROLOGICAL FAILURE WITH RESISTANCE  . Tums [Calcium Carbonate Antacid] Other (See Comments)    TUMS ANTACIDS CAN LOWER GASTRIC PH WHICH COULD  LEAD TO SUBTHERAPEUTIC RILPIVIRINE LEVELS AND POTENTIAL VIROLOGICAL FAILURE WITH RESISTANCE TUMS CAN BE GIVEN BUT NEED CONSULT WITH ID PHARMACY RE TIMING. I PREFER HER TO AVOID ALL TOGETHER    Patient Measurements: Height: 5\' 7"  (170.2 cm) Weight: (!) 301 lb (136.5 kg) IBW/kg (Calculated) : 61.6  Vital Signs:    Labs: Recent Labs    07/01/19 0442  HGB 8.2*  HCT 29.4*  PLT 195  CREATININE 1.35*    Estimated Creatinine Clearance: 61.7 mL/min (A) (by C-G formula based on SCr of 1.35 mg/dL (H)).   Medical History: Past Medical History:  Diagnosis Date  . AKI (acute kidney injury) ([place]) 06/02/2019  . Alopecia areata 11/28/2009  . Bell's palsy   . Cancer [facility]) 2005   Breast cancer   chemotherapy and radiation  . CKD (chronic kidney disease) stage 3, GFR 30-59 ml/min 06/08/2015  . Dry eye syndrome   . Family history of lung cancer   . Family history of non-Hodgkin's lymphoma   . Fasting hyperglycemia   . Gestational diabetes    2001  . HIP FRACTURE, RIGHT 05/06/2008  . History of kidney stones   . HIV DISEASE 03/27/2006  . HYPERLIPIDEMIA, MIXED 12/15/2007  . HYPERTENSION 03/27/2006  . 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 MENORRHAGIA, POSTMENOPAUSAL 02/03/2009  .  Osteoarthritis of left knee 06/08/2015  . OSTEOARTHROSIS, LOCAL, SCND, UNSPC SITE 04/07/2007  . Personal history of chemotherapy 11/2018  . PVD 04/07/2007  . Tinea capitis   . TRIGGER FINGER 05/06/2008  . Unspecified vitamin D deficiency 08/06/2007    Assessment: 64 year old female with RLE nonpitting edema and history of metastatic cancer found to have diffuse DVT on LE Dopplers. Pharmacy consulted for Lovenox therapy.   Last LMWH level 1/1 was therapeutic at 1.06. Hgb low but stable at 8.2 1/6, plt wnl. No bleeding issues noted.   Note progression to comfort measures, lovenox continues. Primary team and palliative care to address when/if to stop with extensive DVT noted.   Will not order additional labs or make further dose adjustments at this time. Pharmacy will sign off and follow peripherally as needed.   Goal of Therapy:  Monitor platelets by anticoagulation protocol: Yes   Plan:  -Continue lovenox 50 mg q 12 hrs for now  Thank you,   Erin Hearing PharmD., BCPS Clinical Pharmacist 07/02/2019 8:08 AM

## 2019-07-03 MED ORDER — GLYCOPYRROLATE 0.2 MG/ML IJ SOLN
0.2000 mg | INTRAMUSCULAR | Status: DC | PRN
  Administered 2019-07-03: 0.2 mg via INTRAVENOUS
  Filled 2019-07-03: qty 1

## 2019-07-03 NOTE — Progress Notes (Signed)
Patient seen, resting comfortably, on a Dilaudid drip -Poorly responsive but occasionally moans -Continue comfort care -Anticipate hospital demise, greatly appreciate palliative team in her care  Domenic Polite, MD

## 2019-07-03 NOTE — Social Work (Signed)
CSW acknowledging pt under comfort care. Will follow for disposition should hospice services or residential hospice become appropriate.   [place], [place] [facility]

## 2019-07-03 NOTE — Progress Notes (Signed)
I followed up with pt for pastoral support. She is unresponsive. No family present at this time. I prayed with Joycelyn Schmid, offered ministry of presence, and read scripture.   Palliative Care  Resident  Chaplain  Fidel Levy MA  [phone]

## 2019-07-03 NOTE — Progress Notes (Signed)
Manufacturing engineer  Kayla Price is active in our community based palliative care team.  Kayla Price will continue to follow, noted anticipated hospital death, to provide support/grief counseling to Kayla Price granddaughter and family.  Venia Carbon RN,BSN,CCRN [facility] Liaison

## 2019-07-04 NOTE — Progress Notes (Signed)
Patient seen and examined with nurse -Remains poorly responsive, occasionally moans and groans -Continue comfort measures, Dilaudid drip with boluses as needed -Appreciate palliative team support -Anticipate hospital demise, if remains stable over the weekend, will need to discuss residential hospice early next week  Domenic Polite, MD

## 2019-07-05 NOTE — Progress Notes (Signed)
Patient seen and examined, discussed with RN, -received some Dilaudid boluses overnight -Currently on 5 L O2 via nasal cannula, blood pressure in the 90s -Appears comfortable on a Dilaudid drip with occasional moans and groans -Appreciate palliative team assistance -May need to consider residential hospice early next week   Domenic Polite, MD

## 2019-07-05 NOTE — Progress Notes (Signed)
Pt's daughter reported that her mother had a seizure like activity. Pt now sleeping comfortably.  Idolina Primer, RN

## 2019-07-05 NOTE — Progress Notes (Signed)
Noticed rattling breathing sound which is new for her. Informed her daughter.  States she will come here for her after she finds somebody to stay with her daughter.  Idolina Primer, RN

## 2019-07-06 ENCOUNTER — Other Ambulatory Visit: Payer: Self-pay

## 2019-07-06 NOTE — Progress Notes (Addendum)
HEMATOLOGY-ONCOLOGY PROGRESS NOTE  SUBJECTIVE: Lyrica is somewhat alert, but cannot focus on my or answer questions. Sister at bedside. States Kayla Price was restless and moving the left side of her body a fair amount when she first arrived. Kayla Price was given additional bolus of Dilaudid and is now more comfortable. Awaiting bed at [facility].   Oncology History  Malignant neoplasm of upper-outer quadrant of right breast in female, estrogen receptor negative ([place])  03/2004 Surgery   Right lumpectomy and SLNB for IDC, 0.5cm, 1/2 + SLN, grade 3, ER-, PR-, HER-2 -.  Treated with Doxorubicin and Cyclophosphamide x 4, followed by weekly Paclitaxel x 7 and adjuvant radiation.    10/01/2016 Initial Biopsy   Right breast upper outer quadrant biopsy: IDC, DCIS, triple negative, grade 3. Lymph node: Positive   11/05/2016 Surgery   Right modified radical mastectomy: IDC, grade 3, 2.6 cm, +LVI, triple negative, margins negative, 2/10 LN + metastases.  T2, N1a   11/20/2016 - 03/11/2017 Adjuvant Chemotherapy   Gemcitabine/Carboplatin given on days 1 and 8 on a 21 day cycle x 6 cycles   07/15/2018 - 07/15/2018 Chemotherapy   The patient had pembrolizumab (KEYTRUDA) 200 mg in sodium chloride 0.9 % 50 mL chemo infusion, 200 mg, Intravenous, Once, 0 of 6 cycles  for chemotherapy treatment.    07/15/2018 - 01/01/2019 Chemotherapy   The patient had atezolizumab (TECENTRIQ) 840 mg in sodium chloride 0.9 % 250 mL chemo infusion, 840 mg, Intravenous, Once, 6 of 8 cycles Dose modification: 1,680 mg (original dose 840 mg, Cycle 4, Reason: Provider Judgment), 840 mg (original dose 840 mg, Cycle 4, Reason: Provider Judgment) Administration: 840 mg (07/15/2018), 840 mg (08/01/2018), 840 mg (08/15/2018), 840 mg (08/29/2018), 840 mg (09/12/2018), 840 mg (09/26/2018), 840 mg (10/10/2018), 840 mg (10/24/2018), 1,680 mg (11/07/2018), 1,680 mg (12/05/2018)  for chemotherapy treatment.    12/19/2018 -  Chemotherapy   The patient had  pegfilgrastim (NEULASTA) injection 6 mg, 6 mg, Subcutaneous, Once, 1 of 1 cycle Administration: 6 mg (03/28/2019) pegfilgrastim (NEULASTA ONPRO KIT) injection 6 mg, 6 mg, Subcutaneous, Once, 4 of 8 cycles Administration: 6 mg (01/09/2019), 6 mg (01/23/2019), 6 mg (02/06/2019), 6 mg (02/20/2019), 6 mg (04/03/2019), 6 mg (04/17/2019) eriBULin mesylate (HALAVEN) 2.75 mg in sodium chloride 0.9 % 100 mL chemo infusion, 1.2 mg/m2 = 2.75 mg (100 % of original dose 1.2 mg/m2), Intravenous,  Once, 5 of 9 cycles Dose modification: 1.2 mg/m2 (original dose 1.2 mg/m2, Cycle 1, Reason: Provider Judgment) Administration: 2.75 mg (12/19/2018), 2.75 mg (01/09/2019), 2.75 mg (01/23/2019), 2.75 mg (02/06/2019), 2.75 mg (02/20/2019), 2.75 mg (03/06/2019), 2.75 mg (03/25/2019), 2.75 mg (04/03/2019), 2.75 mg (04/17/2019)  for chemotherapy treatment.    01/02/2019 - 01/02/2019 Chemotherapy   The patient had atezolizumab (TECENTRIQ) 1,200 mg in sodium chloride 0.9 % 250 mL chemo infusion, 1,200 mg, Intravenous, Once, 0 of 6 cycles  for chemotherapy treatment.    Metastatic breast cancer ([place])  11/05/2016 Initial Diagnosis   Recurrent cancer of right breast ([place])   07/15/2018 - 01/01/2019 Chemotherapy   The patient had atezolizumab (TECENTRIQ) 840 mg in sodium chloride 0.9 % 250 mL chemo infusion, 840 mg, Intravenous, Once, 5 of 8 cycles Dose modification: 1,680 mg (original dose 840 mg, Cycle 4, Reason: Provider Judgment), 840 mg (original dose 840 mg, Cycle 4, Reason: Provider Judgment) Administration: 840 mg (07/15/2018), 840 mg (08/01/2018), 840 mg (08/15/2018), 840 mg (08/29/2018), 840 mg (09/12/2018), 840 mg (09/26/2018), 840 mg (10/10/2018), 840 mg (10/24/2018), 1,680 mg (11/07/2018)  for chemotherapy  treatment.    12/19/2018 -  Chemotherapy   The patient had pegfilgrastim (NEULASTA) injection 6 mg, 6 mg, Subcutaneous, Once, 1 of 1 cycle Administration: 6 mg (03/28/2019) pegfilgrastim (NEULASTA ONPRO KIT) injection 6 mg, 6 mg, Subcutaneous,  Once, 4 of 8 cycles Administration: 6 mg (01/09/2019), 6 mg (01/23/2019), 6 mg (02/06/2019), 6 mg (02/20/2019), 6 mg (04/03/2019), 6 mg (04/17/2019) eriBULin mesylate (HALAVEN) 2.75 mg in sodium chloride 0.9 % 100 mL chemo infusion, 1.2 mg/m2 = 2.75 mg (100 % of original dose 1.2 mg/m2), Intravenous,  Once, 5 of 9 cycles Dose modification: 1.2 mg/m2 (original dose 1.2 mg/m2, Cycle 1, Reason: Provider Judgment) Administration: 2.75 mg (12/19/2018), 2.75 mg (01/09/2019), 2.75 mg (01/23/2019), 2.75 mg (02/06/2019), 2.75 mg (02/20/2019), 2.75 mg (03/06/2019), 2.75 mg (03/25/2019), 2.75 mg (04/03/2019), 2.75 mg (04/17/2019)  for chemotherapy treatment.      PHYSICAL EXAMINATION: ECOG PERFORMANCE STATUS: 4 - Bedbound  Vitals:   07/05/19 1933 07/06/19 0432  BP: 90/65 90/73  Pulse: 76 72  Resp: (!) 9 15  Temp: 97.7 F (36.5 C) 97.7 F (36.5 C)  SpO2: 98% 100%   Filed Weights   06/28/19 0627 06/29/19 0410 06/30/19 0449  Weight: 298 lb (135.2 kg) (!) 301 lb (136.5 kg) (!) 301 lb (136.5 kg)    Intake/Output from previous day: 01/10 0701 - 01/11 0700 In: 92 [I.V.:12; IV Piggyback:80] Out: 250 [Urine:250]  General: Eyes open, but not responsive to me Lungs no rales or rhonchi--auscultated anterolaterally Heart regular rate and rhythm Abd soft, nontender, positive bowel sounds Neuro: Somewhat alert, but does not respond to me Breasts: Deferred  LABORATORY DATA:  I have reviewed the data as listed CMP Latest Ref Rng & Units 07/01/2019 06/29/2019 06/28/2019  Glucose 70 - 99 mg/dL 96 85 100(H)  BUN 8 - 23 mg/dL _0 Creatinine 0.44 - 1.00 mg/dL 1.35(H) 1.33(H) 1.26(H)  Sodium 135 - 145 mmol/L 150(H) 146(H) 146(H)  Potassium 3.5 - 5.1 mmol/L 4.5 4.4 4.1  Chloride 98 - 111 mmol/L 105 106 107  CO2 22 - 32 mmol/L 36(H) 34(H) 34(H)  Calcium 8.9 - 10.3 mg/dL 9.1 9.3 9.0  Total Protein 6.5 - 8.1 g/dL - - -  Total Bilirubin 0.3 - 1.2 mg/dL - - -  Alkaline Phos 38 - 126 U/L - - -  AST 15 - 41 U/L - - -   ALT 0 - 44 U/L - - -    Lab Results  Component Value Date   WBC 9.4 07/01/2019   HGB 8.2 (L) 07/01/2019   HCT 29.4 (L) 07/01/2019   MCV 115.3 (H) 07/01/2019   PLT 195 07/01/2019   NEUTROABS 8.3 (H) 07/01/2019    EEG  Result Date: 06/21/2019 Kayla Doom, MD     06/21/2019 12:38 PM Patient Name: Kayla Price MRN: [id number] EEG Attending: Roland Rack Referring Physician/Provider: Roland Rack Date: 06/21/2019 Duration: 24 minutes Patient history: 64 yo F with multiple intracranial metastasis with encephalopathy. Recent EEG with triphasic waves. Level of alertness: Encephalopathic AEDs during EEG study: Keppra 568m BID Technical aspects: This EEG study was done with scalp electrodes positioned according to the 10-20 International system of electrode placement. Electrical activity was acquired at a sampling rate of _1  and reviewed with a high frequency filter of _2  and a low frequency filter of _3 . EEG data were recorded continuously and digitally stored. BACKGROUND ACTIVITY:  The background consist predominately of generalized irregular slow activity with some frontocentral predominant beta range activity.  There are poorly formed sleep structures at times.  No definite posterior dominant rhythm was seen, however following stimulation there was briefly a posteriorly predominant rhythm of 8 Hz but this was very briefly sustained. EPILEPTIFORM ACTIVITY: Interictal epileptiform activity: None Ictal Activity: None OTHER EVENTS: None SLEEP RECORDINGS: Only awake and drowsy states were recorded. Drowsiness was characterized by attenuation of the posterior background rhythm. ACTIVATION PROCEDURES: Hyperventilation and photic stimulation were not performed. IMPRESSION: This EEG is consistent with a generalized nonspecific cerebral dysfunction.  There has been significant improvement since the previous recording and that no triphasic waves are seen in the study. There was  no seizure or evidence of seizure predisposition recorded on this study. Please note that lack of epileptiform activity on EEG does not preclude the possibility of epilepsy.  Roland Rack, MD [facility] [phone] If 7pm- 7am, please page neurology on call as listed in [place].   CT ANGIO CHEST PE W OR WO CONTRAST  Result Date: 06/09/2019 CLINICAL DATA:  Shortness of breath.  Metastatic breast cancer. EXAM: CT ANGIOGRAPHY CHEST WITH CONTRAST TECHNIQUE: Multidetector CT imaging of the chest was performed using the standard protocol during bolus administration of intravenous contrast. Multiplanar CT image reconstructions and MIPs were obtained to evaluate the vascular anatomy. CONTRAST:  71m OMNIPAQUE IOHEXOL 350 MG/ML SOLN COMPARISON:  PET CT 03/11/2019.  Chest CT 04/22/2018 FINDINGS: Cardiovascular: No filling defects in the pulmonary arteries to suggest pulmonary emboli. Tortuous, ectatic aorta with maximum diameter 3.7 cm. No dissection. Heart is enlarged. Mediastinum/Nodes: Extensive adenopathy in the mediastinum. Surgical clips in the right axilla. This is unchanged since recent PET CT. Lungs/Pleura: Extensive pleural nodularity/disease throughout right hemithorax, similar to recent PET CT. Right middle lobe mass is unchanged. Central necrotic portion again noted, unchanged. Scattered ground-glass airspace disease noted within both lungs concerning for pneumonia. Upper Abdomen: Gastrohepatic ligament lymph nodes again noted, unchanged. No acute findings. Musculoskeletal: Prior right mastectomy. Right chest wall Port-A-Cath remains in place, unchanged. Sclerotic lesion within the T11 vertebral body is stable. Review of the MIP images confirms the above findings. IMPRESSION: No evidence of pulmonary embolus. Extensive mediastinal adenopathy, right pleural metastatic disease, and right middle lobe mass are all stable since prior PET CT. Worsening ground-glass airspace disease throughout  both lungs concerning for pneumonia. Cardiomegaly. Tortuous, ectatic thoracic aorta. Electronically Signed   By: KRolm BaptiseM.D.   On: 06/09/2019 17:44   MR BRAIN W WO CONTRAST  Result Date: 06/18/2019 CLINICAL DATA:  Encephalopathy. Metastatic breast cancer. Known brain metastases. EXAM: MRI HEAD WITHOUT AND WITH CONTRAST TECHNIQUE: Multiplanar, multiecho pulse sequences of the brain and surrounding structures were obtained without and with intravenous contrast. CONTRAST:  97mGADAVIST GADOBUTROL 1 MMOL/ML IV SOLN COMPARISON:  MR head without and with contrast 04/28/2019 FINDINGS: Brain: The right occipital lesion has decreased somewhat. There is mostly peripheral enhancement with a nodular component posteriorly. The lesion now measures 2.4 x 2.6 x 2.1 cm. The lesion previously measured 3.4 x 3.1 x 2.7 cm in the same dimensions. A left occipital lesion previously measured 13 x 11 mm. It now measures 13 x 9 mm. A more lateral lesion in the left occipital lobe has decreased from 9 mm to 6.5 mm. A lesion in the lateral left temporal lobe has decreased from 6.53.5 mm on image 23 of series 10. A medial right temporal lobe lesion on image 25 has decreased from 7.5 mm to 6 mm. A lesion in the left paramedian superior cerebellar vermis now measures 4  mm. Previously measured 7 mm. A lesion in the left paramedian pons previously measured 4.5 mm. It is now punctate in size. A lesion in the anterior left frontal lobe white matter on image 36 as decreased from 7.5 mm to 3.5 mm. A punctate lesion in the left corona radiata previously measured 4 mm. Punctate lesion is again noted in the posterior left frontal lobe on image 45. No new enhancing lesions are present. Vasogenic edema is most prominent about the occipital lobe lesions. This has decreased since the prior exam. No acute infarct or hemorrhage is present. Ventricles are of normal size. No significant extraaxial fluid collection is present. Vascular: Flow is present  in the major intracranial arteries. Skull and upper cervical spine: Extensive marrow signal changes in the calvarium ir consistent with osseous metastases. Heterogeneous marrow signal is present in the upper cervical spine. Sinuses/Orbits: Minimal fluid is present in the left maxillary sinus. A fluid level is present in the left sphenoid sinus. Left ethmoid air cells are partially opacified. The paranasal sinuses and mastoid air cells are otherwise clear. IMPRESSION: 1. Decrease in size of multiple brain metastases identified on the prior exam. No new lesions are present. 2. Decreased vasogenic edema, still predominantly occipital lobes. 3. Diffuse marrow signal changes in the skull concerning for metastatic disease. 4. Left maxillary and sphenoid sinus disease. Electronically Signed   By: San Morelle M.D.   On: 06/18/2019 18:44   DG CHEST PORT 1 VIEW  Result Date: 06/30/2019 CLINICAL DATA:  Pneumonia.  History of breast cancer. EXAM: PORTABLE CHEST 1 VIEW COMPARISON:  06/24/2019. FINDINGS: PowerPort catheter noted with tip over superior vena cava in stable position. Stable cardiomegaly. Diffuse right lung infiltrate/consolidation again noted. Right pleural effusion again noted. No interim change. Stable mild left mid lung atelectasis/infiltrate. No pneumothorax. IMPRESSION: 1.  PowerPort catheter in stable position. 2.  Stable cardiomegaly. 3. Diffuse right lung infiltrate/consolidation again and prominent right pleural effusion again noted without interim change. Stable mild left mid lung atelectasis/infiltrate. Electronically Signed   By: Marcello Moores  Register   On: 06/30/2019 08:40   DG CHEST PORT 1 VIEW  Result Date: 06/24/2019 CLINICAL DATA:  History of breast cancer. Shortness of breath. Pneumonia. EXAM: PORTABLE CHEST 1 VIEW COMPARISON:  Single-view of the chest 06/12/2019, 06/18/2019 and 06/19/2019. FINDINGS: Right Port-A-Cath remains in place. Extensive airspace disease in the right lung  persist but aeration in the right upper lung zone is mildly improved since the most recent study. Airspace disease in the left lung has markedly improved since the most recent examination. There is some residual atelectasis in the left mid lung zone. Cardiac silhouette is largely obscured. Surgical clips in the right axilla and right IJ approach Port-A-Cath are noted. IMPRESSION: Improved aeration in the left mid lung zone with residual atelectasis noted. Large right pleural effusion and airspace disease persist although aeration in the right upper lung zone appears mildly improved since the most recent study. Electronically Signed   By: Inge Rise M.D.   On: 06/24/2019 13:33   DG CHEST PORT 1 VIEW  Result Date: 06/19/2019 CLINICAL DATA:  Unresponsive and agitated. History metastatic breast carcinoma with known extensive right-sided pleural and parenchymal metastatic disease. EXAM: PORTABLE CHEST 1 VIEW COMPARISON:  06/18/2019 FINDINGS: Stable appearance of Port-A-Cath. Stable volume loss and poor aeration of the right lung secondary to extensive pleural tumor and associated loculated right pleural fluid. Lower volume of the left lung with persistent dense infiltrate in the left mid lung and probable  component of infiltrate at the left lung base. No pneumothorax. No significant left-sided pleural fluid identified. IMPRESSION: Stable extensive pleural and parenchymal disease in the right chest with associated loculated right pleural fluid. Stable dense infiltrate in the left mid lung and probable component of infiltrate at the left lung base. Electronically Signed   By: Aletta Edouard M.D.   On: 06/19/2019 14:37   DG CHEST PORT 1 VIEW  Result Date: 06/18/2019 CLINICAL DATA:  Hypoxia EXAM: PORTABLE CHEST 1 VIEW COMPARISON:  06/13/2019 FINDINGS: Right Port-A-Cath remains in place, unchanged. Cardiomegaly. Diffuse right lung and patchy left lung airspace disease again noted, not significantly changed.  Possible right effusion. No acute bony abnormality. IMPRESSION: No significant change since prior study. Electronically Signed   By: Rolm Baptise M.D.   On: 06/18/2019 09:17   DG CHEST PORT 1 VIEW  Result Date: 06/13/2019 CLINICAL DATA:  Hypoxia. EXAM: PORTABLE CHEST 1 VIEW COMPARISON:  June 12, 2019 FINDINGS: The right Port-A-Cath is stable. No pneumothorax. Infiltrate in the left mid lung is more focal in the interval. Infiltrate in the right lung, particularly in the lower right lung is similar in the interval. The cardiomediastinal silhouette is stable. No other interval changes. Stable cardiomediastinal silhouette. IMPRESSION: 1. Stable infiltrate on the right, particularly in the right base. More focal infiltrate in the left mid lung compared to the previous study. Stable right Port-A-Cath. Electronically Signed   By: Dorise Bullion III M.D   On: 06/13/2019 15:45   DG Chest Port 1 View  Result Date: 06/12/2019 CLINICAL DATA:  Hypoxia. Metastatic breast cancer. EXAM: PORTABLE CHEST 1 VIEW COMPARISON:  CTA chest 06/09/2019. One-view chest x-ray 06/08/2019. FINDINGS: The heart size is normal. The heart is enlarged. Right IJ Port-A-Cath is stable. A right pleural effusion has slightly increased. Right greater than left airspace opacities have progressed. Surgical clips are noted in the right axilla. IMPRESSION: 1. Progressive right greater than left airspace disease compatible with pneumonia. 2. Slight increase in right pleural effusion. 3. Stable right IJ Port-A-Cath. Electronically Signed   By: San Morelle M.D.   On: 06/12/2019 09:29   DG CHEST PORT 1 VIEW  Result Date: 06/08/2019 CLINICAL DATA:  Hypoxia EXAM: PORTABLE CHEST 1 VIEW COMPARISON:  07/10/2018 FINDINGS: Right Port-A-Cath in place with the tip in the SVC. Airspace disease in the right lower lung concerning for pneumonia. No confluent opacity on the left. Heart is borderline in size. Possible small right effusion. No acute  bony abnormality. IMPRESSION: Right lower lung airspace disease concerning for pneumonia. Small right effusion suspected. Electronically Signed   By: Rolm Baptise M.D.   On: 06/08/2019 18:55   EEG adult  Result Date: 06/18/2019 Kayla Havens, MD     06/18/2019  8:55 PM Patient Name: Kayla Price MRN: [id number] Epilepsy Attending: Lora Price Referring Physician/Provider: Dr Debbe Odea Date: 06/18/2019 Duration: 24.56 mins Patient history: 64yo F with HIV, brain mets and now with ams. EEG to evaluate for seizure Level of alertness: awake AEDs during EEG study: None Technical aspects: This EEG study was done with scalp electrodes positioned according to the 10-20 International system of electrode placement. Electrical activity was acquired at a sampling rate of _0  and reviewed with a high frequency filter of _1  and a low frequency filter of _2 . EEG data were recorded continuously and digitally stored. DESCRIPTION: During awake state, no clear posterior dominant rhythm. EEG also showed continuous generalized  3-_3  theta-delta slowing. Triphasic waves, generalized, maximal bifrontal, at  _0  were also noted. Hyperventilation and photic stimulation were not performed. ABNORMALITY - Continuous slow, generalized - Triphasic waves, generalized IMPRESSION: This study is  Suggestive of severe diffuse encephalopathy, non specific to etiology but could be secondary to toxic-metabolic causes, cepefime toxicity. No seizures or definite epileptiform discharges were seen throughout the recording. Kayla Price   ECHOCARDIOGRAM COMPLETE  Result Date: 06/12/2019   ECHOCARDIOGRAM REPORT   Patient Name:   Kayla Price Date of Exam: 06/12/2019 Medical Rec #:  [id number]         Height:       67.0 in Accession #:    [id number]        Weight:       198.0 lb Date of Birth:  1956/03/12         BSA:          2.01 m Patient Age:    16 years          BP:           128/76 mmHg Patient Gender: F                  HR:           108 bpm. Exam Location:  Inpatient Procedure: 2D Echo, Color Doppler and Cardiac Doppler Indications:    R06.9 DOE  History:        Patient has no prior history of Echocardiogram examinations.                 Risk Factors:Hypertension and Dyslipidemia.  Sonographer:    Raquel Sarna Senior RDCS Referring Phys: [place]  1. Left ventricular ejection fraction, by visual estimation, is 65 to 70%. The left ventricle has normal function. There is no left ventricular hypertrophy.  2. The left ventricle has no regional wall motion abnormalities.  3. Global right ventricle has normal systolic function.The right ventricular size is mildly enlarged. No increase in right ventricular wall thickness.  4. Left atrial size was normal.  5. Right atrial size was severely dilated.  6. The mitral valve is normal in structure. Mild mitral valve regurgitation. No evidence of mitral stenosis.  7. The tricuspid valve is normal in structure. Tricuspid valve regurgitation is mild.  8. The aortic valve is tricuspid. Aortic valve regurgitation is not visualized. No evidence of aortic valve sclerosis or stenosis.  9. There is severe calcifcation of the aortic valve. 10. The pulmonic valve was normal in structure. Pulmonic valve regurgitation is trivial. 11. Severely elevated pulmonary artery systolic pressure at 42HCWC. 12. The inferior vena cava is normal in size with greater than 50% respiratory variability, suggesting right atrial pressure of 3 mmHg. 13. The interatrial septum appears to be lipomatous. FINDINGS  Left Ventricle: Left ventricular ejection fraction, by visual estimation, is 65 to 70%. The left ventricle has normal function. The left ventricle has no regional wall motion abnormalities. There is no left ventricular hypertrophy. Normal left atrial pressure. Right Ventricle: The right ventricular size is mildly enlarged. No increase in right ventricular wall thickness. Global RV systolic function  is has normal systolic function. The tricuspid regurgitant velocity is 4.00 m/s, and with an assumed right atrial  pressure of 8 mmHg, the estimated right ventricular systolic pressure is severely elevated at 72.0 mmHg. Left Atrium: Left atrial size was normal in size. Right Atrium: Right atrial size was severely dilated Pericardium: There is no evidence of pericardial effusion. Mitral Valve: The mitral valve is normal in  structure. Mild mitral valve regurgitation. No evidence of mitral valve stenosis by observation. Tricuspid Valve: The tricuspid valve is normal in structure. Tricuspid valve regurgitation is mild. Aortic Valve: The aortic valve is tricuspid. . There is moderate thickening and severe calcifcation of the aortic valve. Aortic valve regurgitation is not visualized. The aortic valve is structurally normal, with no evidence of sclerosis or stenosis. There is moderate thickening of the aortic valve. There is severe calcifcation of the aortic valve. Pulmonic Valve: The pulmonic valve was normal in structure. Pulmonic valve regurgitation is trivial. Pulmonic regurgitation is trivial. Aorta: The aortic root, ascending aorta and aortic arch are all structurally normal, with no evidence of dilitation or obstruction. Venous: The inferior vena cava is normal in size with greater than 50% respiratory variability, suggesting right atrial pressure of 3 mmHg. IAS/Shunts: Increased thickness of the atrial septum sparing the fossa ovalis consistent with The interatrial septum appears to be lipomatous. No atrial level shunt detected by color flow Doppler. There is no evidence of a patent foramen ovale. No ventricular septal defect is seen or detected. There is no evidence of an atrial septal defect.  LEFT VENTRICLE PLAX 2D LVIDd:         3.75 cm LVIDs:         1.82 cm LV PW:         1.05 cm LV IVS:        0.94 cm LVOT diam:     2.10 cm LV SV:         50 ml LV SV Index:   23.95 LVOT Area:     3.46 cm  RIGHT VENTRICLE              IVC RV Basal diam:  3.23 cm     IVC diam: 1.44 cm RV Mid diam:    4.81 cm RV Length:      6.81 cm RV S prime:     21.10 cm/s TAPSE (M-mode): 1.8 cm LEFT ATRIUM           Index LA diam:      3.10 cm 1.54 cm/m LA Vol (A2C): 34.1 ml 16.94 ml/m LA Vol (A4C): 46.1 ml 22.90 ml/m  AORTIC VALVE LVOT Vmax:   98.80 cm/s LVOT Vmean:  74.000 cm/s LVOT VTI:    0.192 m  AORTA Ao Root diam: 3.10 cm Ao Asc diam:  3.60 cm TRICUSPID VALVE TR Peak grad:   64.0 mmHg TR Vmax:        400.00 cm/s  SHUNTS Systemic VTI:  0.19 m Systemic Diam: 2.10 cm  Fransico Him MD Electronically signed by Fransico Him MD Signature Date/Time: 06/12/2019/3:15:41 PM    Final    VAS [place] LOWER EXTREMITY VENOUS (DVT)  Result Date: 06/09/2019  Lower Venous Study Indications: Swelling.  Risk Factors: Cancer History of metastatic breast cancer. Comparison Study: No prior study on file Performing Technologist: Sharion Dove RVS  Examination Guidelines: A complete evaluation includes B-mode imaging, spectral Doppler, color Doppler, and power Doppler as needed of all accessible portions of each vessel. Bilateral testing is considered an integral part of a complete examination. Limited examinations for reoccurring indications may be performed as noted.  +---------+---------------+---------+-----------+----------+--------------+ RIGHT    CompressibilityPhasicitySpontaneityPropertiesThrombus Aging +---------+---------------+---------+-----------+----------+--------------+ CFV      Partial        Yes      Yes                  Acute          +---------+---------------+---------+-----------+----------+--------------+  SFJ      Full                                                        +---------+---------------+---------+-----------+----------+--------------+ FV Prox  None                                         Acute          +---------+---------------+---------+-----------+----------+--------------+ FV Mid   None                                          Acute          +---------+---------------+---------+-----------+----------+--------------+ FV DistalNone                                         Acute          +---------+---------------+---------+-----------+----------+--------------+ PFV      Full                                                        +---------+---------------+---------+-----------+----------+--------------+ POP      None           No       No                   Acute          +---------+---------------+---------+-----------+----------+--------------+ PTV      None                                         Acute          +---------+---------------+---------+-----------+----------+--------------+ PERO     None                                         Acute          +---------+---------------+---------+-----------+----------+--------------+ EIV                     Yes      Yes                                 +---------+---------------+---------+-----------+----------+--------------+   +----+---------------+---------+-----------+----------+--------------+ LEFTCompressibilityPhasicitySpontaneityPropertiesThrombus Aging +----+---------------+---------+-----------+----------+--------------+ CFV Full           Yes      Yes                                 +----+---------------+---------+-----------+----------+--------------+     Summary: Right: Findings consistent with acute deep vein thrombosis involving the right common femoral vein, right femoral  vein, right popliteal vein, right posterior tibial veins, and right peroneal veins. Left: No evidence of common femoral vein obstruction.  *See table(s) above for measurements and observations. Electronically signed by Harold Barban MD on 06/09/2019 at 8:27:06 AM.    Final     ASSESSMENT: 64 y.o. [place] woman    (1) status post right lumpectomy and sentinel lymph node sampling October 2005 for a 0.6 cm invasive  ductal carcinoma involving one out of 2 sentinel lymph nodes sampled, grade 3, triple-negative, treated adjuvantly with doxorubicin and cyclophosphamide 4 followed by weekly paclitaxel 7, followed by adjuvant radiation   RECURRENT DISEASE: (2) status post right breast upper outer quadrant biopsy and right axillary lymph node biopsy 10/01/2016, both positive for a T2 N1, stage IIIB invasive ductal carcinoma, triple negative, with an MIB-1 of 50-70%   (3) status post right modified radical mastectomy 11/05/2016 showing a pT2 pN1, stage IIIB invasive ductal carcinoma, grade 3, triple negative, with negative margins   (4) not a candidate for radiation given prior history   (5) adjuvant chemotherapy consisting of carboplatin and gemcitabine given days 1 and 8 of each 21 day cycle, for 6 cycles, starting 11/20/2016, completed 03/11/2017             (a) day 8 cycle 2 omitted because of neutropenia; Neupogen/Neulasta added   (5) HIV positivity: under care of [facility] [place])   (6) Genetic testing 06/17/2017:  no pathogenic mutations. Genes tested: APC, ATM, AXIN2, BARD1, BLM, BMPR1A, BRCA1, BRCA2, BRIP1, CDH1, CDK4, CDKN2A (p14ARF), CDKN2A (p16INK4a), CEBPA, CHEK2, CTNNA1, DICER1, EPCAM*, GATA2, GREM1*, HRAS, KIT, MEN1, MLH1, MSH2, MSH3, MSH6, MUTYH, NBN, NF1, PALB2, PDGFRA, PMS2, POLD1, POLE, PTEN, RAD50, RAD51C, RAD51D, RUNX1, SDHB, SDHC, SDHD, SMAD4, SMARCA4, STK11, TERC, TERT, TP53, TSC1, TSC2, VHL. The following genes were evaluated for sequence changes only: HOXB13*, NTHL1*, SDHA.              (a) A variant of uncertain significance (VUS) in a gene called NTHL1 was also noted. c.736G>A (p.Ala246Thr)   METASTATIC DISEASE: November 2019 (1) Patient seen in urgent care and ultimately ED on 04/14/2018 for shortness of breath, chest xray demonstrated Right pleural effusion.   (a) right thoracenteses on 10/21 and 11/4 results show atypical cells, non diagnostic (b) CT chest 04/22/2018 shows re-accumulation  of fluid, and right pleural nodularity. (c) PET scan on 05/01/2018 shows hypermetabolic pleural based metastases, right CP angle nodal metastases, no evidence of malignancy in abdomen and pelvis. (d) bronchoscopy with biopsy by Dr. Lamonte Sakai and BAL on 05/15/2018 was non diagnostic             (e) VAT biopsy of the right pleura 05/30/2018 confirms carcinoma, triple negative             (f) [facility] shows PD-L1 positive (1% in [facility]); otherwise microsatellite stable, TMB low (5/Mb), no PIK3 mutations; other mutations suggest sensitivity to MTOR inhibitors and several TKIs   (2) Atezolizumab started 07/15/2018 given every other week             (a) PET 08/13/2018 shows tumor Right hemithorax (new baseline study)             (b) Atezo changed to Q4w starting with 11/07/2018 dose             (c) PET 11/28/2018 documents progression in the right lung and pleural area as well as lymph nodes, and a T1 skeletal metastasis             (  d) atezolizumab discontinued after 12/05/2018 dose   (3) eribulin day 1 and 8 of every 21 day cycle started 12/19/2018             (a) changed to every other week with Neulsta support due to neutropenia causing treatment delays and her h/o HIV (delays due to insurance denial of onpro after 02/20/2019 dose)             (b) PET scan on 03/11/2019 showed stable to improved measurable disease, with multiple new bone lesions             (c) zoledronate given every 12 weeks starting on 03/25/2019   (4) brain MRI 04/28/2019 documents multiple intracranial metastases             (a) whole brain radiation 04/30/2019 - 05/13/2019, 30 Gy in 10 fractions  (5) right lower extremity DVT diagnosed December 2020     PLAN:  Kellis has been transitioned to comfort measures. She is somewhat awake, but not responding to voice and does not follow me. She is on a Dilaudid drip and boluses available to her every 30 minutes. She seems comfortable at the time of my visit. Answered all of her  sister's questions. I reassured her that we would keep Tamarah as comfortable as possible. Anticipate death within a few days. She will be transferred to [facility] when a bed is available. The patient is a DNR/DNI.   I appreciate your help to this patient and her family.   LOS: 33 days   Mikey Bussing, [place], AGPCNP-BC, AOCNP 07/06/19   ADDENDUM: Shalaina appears moderately comfortable.  She did not clearly recognize me or respond to voice this morning.  It was difficult to ascertain whether she had pain or not.  I agree with the plan to transfer to [facility] as soon as a bed is available.  I discussed the situation with the patient's daughter Friday, 07/02/2018.  The family is very aware of the entire course of the patient's illness and understand that we are in the terminal phase.  The only goal at this point is to make sure she is comfortable.  I personally saw this patient and performed a substantive portion of this encounter with the listed APP documented above.   Chauncey Cruel, MD Medical Oncology and Hematology [facility] [address] [place], [place] [address] Tel. [phone]    Fax. [phone]

## 2019-07-06 NOTE — Progress Notes (Signed)
Palliative Care Progress Note  Ms. Hizer is comfortable, has required a few PRN boluses of pain medication today. Daughter noticed seizure activity during her visit today. I spoke with [name] by phone this evening. She is agreeable to transfer to a hospice facility. Provided support and answered her questions.  Recommendations:  Increase Keppra. Maintain current infusion of hydromorphone. Transfer to [facility] when bed available.  Lane Hacker, DO Palliative Medicine  Time: 20 min Greater than 50%  of this time was spent counseling and coordinating care related to the above assessment and plan.

## 2019-07-06 NOTE — Consult Note (Signed)
[facility] CM Inpatient Consult   07/06/2019  Kayla Price [birth year] [id number]   Follow up: Pending status [facility] Care Management  Chart reviewed and patient now in comfort care measures.  Will update change out pending status.  Charlesetta Shanks, RN BSN CCM [facility]  [phone] business mobile phone Toll free office [phone]  Fax number: [phone] [name]

## 2019-07-07 MED ORDER — HEPARIN SOD (PORK) LOCK FLUSH 100 UNIT/ML IV SOLN
500.0000 [IU] | INTRAVENOUS | Status: DC | PRN
  Filled 2019-07-07: qty 5

## 2019-07-07 MED ORDER — MORPHINE SULFATE (CONCENTRATE) 20 MG/ML PO SOLN: 10.0000 mg | ORAL | 0 refills | Status: AC | PRN

## 2019-07-07 NOTE — TOC Transition Note (Signed)
Transition of Care [facility]) - CM/SW Discharge Note   Patient Details  Name: Kayla Price MRN: [id number] Date of Birth: 09-Mar-1956  Transition of Care [facility]) CM/SW Contact:  Gabrielle Dare Phone Number: 07/07/2019, 3:43 PM   Clinical Narrative:    Patient will Discharge To: [facility] Anticipated DC Date:07/07/2019 Family Notified:Yes, Wende Mott, daughter, [phone] Transport RX:[id number]   Per MD patient ready for DC to [facility] . RN, patient, patient's family, and facility notified of DC. Assessment, Fl2/Pasrr, and Discharge Summary sent to facility. RN given number for report [phone]). DC packet on chart. Ambulance transport requested for patient.   CSW signing off.  Reed Breech LCSWA [phone]     Final next level of care: [place] Barriers to Discharge: No Barriers Identified   Patient Goals and CMS Choice Patient states their goals for this hospitalization and ongoing recovery are:: "to get rehab" CMS Medicare.gov Compare Post Acute Care list provided to:: Patient Choice offered to / list presented to : Patient  Discharge Placement              Patient [facility] bed at: [facility]) Patient to be transferred to facility by: [facility] Name of family member notified: [name] Child psychotherapist Patient and family notified of of transfer: 07/07/19  Discharge Plan and Services In-house Referral: Clinical Social Work   Post Acute Care Choice: [place]                               Social Determinants of Health ([place]) Interventions     Readmission Risk Interventions Readmission Risk Prevention Plan 06/16/2019  Transportation Screening Complete  Medication Review Press photographer) Complete  PCP or Specialist appointment within 3-5 days of discharge Complete  HRI or [place] Complete  SW Recovery Care/Counseling Consult Complete  Palliative Care Screening Not [place] Complete   Some recent data might be hidden

## 2019-07-07 NOTE — Progress Notes (Signed)
Spoke with pt's nurse, Vernie Shanks. She stated pt is being discharged to [facility] and facility instructed her to leave port accessed. Educated physician will need to write order that pt may leave with port access in place and line will need to be flushed well with 2mLs NS upon disconnection from IV fluids. Abigail verbalized comprehension.

## 2019-07-07 NOTE — Discharge Summary (Signed)
Physician Discharge Summary  Kayla Price FUX:[id number] DOB: [birth year] DOA: 06/01/2019  PCP: Lauree Chandler, NP  Admit date: 06/01/2019 Discharge date: 07/07/2019  Time spent: 45 minutes  Recommendations for Outpatient Follow-up:  1. 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 Hypernatremia Acute kidney injury Left leg DVT Anemia of chronic disease Adult failure to thrive Hypotension Right lower lobe pneumonia Moderate protein calorie malnutrition   Palliative care encounter   Discharge Condition: Poor  Diet recommendation: Comfort feeds Filed Weights   06/28/19 0627 06/29/19 0410 06/30/19 0449  Weight: 135.2 kg (!) 136.5 kg (!) 136.5 kg    History of present illness:  Kayla H Hunter64 year old female with PMH of metastatic breast cancer with metastasis to brain and right pleura, s/p whole brain radiation, HIV, CKD stage II, recently hospitalized 05/27/2019-05/31/2023 acute kidney injury, hypotension and superficial ulcerations of the groin/perineum who presented the next day of discharge 06/01/2019 due to worsening drainage from her known skin ulcerations along with generalized weakness  Hospital Course:  Kayla H Hunter64 year old female with PMH of metastatic breast cancer with metastasis to brain and right pleura, s/p whole brain radiation, HIV, CKD stage II, recently hospitalized 05/27/2019-05/31/2023 acute kidney injury, hypotension and superficial ulcerations of the groin/perineum who was readmitted within 1 day of discharge due to worsening drainage from her known skin ulcerations along with generalized weakness. She was admitted for hypotension, acute kidney injury and ulcers of her groin and perineum. The ulcers tested positive for HSV and patient  completed a course of Valtrex with improvement.  -This is in the background of widely metastatic breast cancer with chest and brain metastasis -hospital course complicated by aspiration pneumonia, extensive right lower extremity DVT, acute hypoxic respiratory failure. She was transferred to progressive care unit on 12/17, PCCM consulted, BiPAP initiated, primary oncologist and PMT consulted, since she was doing so poorly, discussions regarding goals of care ensued. -She developed recurrent respiratory failure and metabolic encephalopathy secondary to aspiration pneumonia, pain meds, hypercarbia, cefepime  -Multiple discussions with palliative care, family was originally wanting rehab however patient continues to decline and is not in any position for rehabilitation -Patient continued to decline with ongoing failure to thrive, recurrent respiratory failure, severe pain secondary to her metastasis, minimal p.o. intake, followed by palliative care through this hospitalization, now comfort care on a Dilaudid drip, will be discharged to [facility] for residential hospice/end-of-life care  Discharge Exam: Vitals:   07/06/19 2228 07/07/19 0421  BP: 113/77 115/78  Pulse: 75 74  Resp: 16   Temp: (!) 97.4 F (36.3 C) (!) 97.4 F (36.3 C)  SpO2: 100% 100%    General: Poorly responsive, moans and groans Cardiovascular: S1-S2, regular rhythm Respiratory: Conducted upper airway sounds  Discharge Instructions    Allergies as of 07/07/2019      Reactions   Lisinopril Anaphylaxis, Swelling, Other (See Comments)   Swelling of tongue and mouth 11/05/16- tolerates Olmesartan   Pepcid [famotidine] Other (See Comments)   PPI H2, BLOCKERS LOWER GASTRIC PH WHICH WOULD LEAD TO SUBTHERAPEUTIC RILPIVIRINE LEVELS AND POTENTIAL VIROLOGICAL FAILURE WITH RESISTANCE   Prilosec [omeprazole] Other (See Comments)   PPI H2, BLOCKERS LOWER GASTRIC PH WHICH WOULD LEAD TO SUBTHERAPEUTIC RILPIVIRINE LEVELS AND  POTENTIAL VIROLOGICAL FAILURE WITH RESISTANCE   Tums [calcium Carbonate Antacid] Other (See Comments)   TUMS ANTACIDS CAN LOWER GASTRIC PH  WHICH COULD  LEAD TO SUBTHERAPEUTIC RILPIVIRINE LEVELS AND POTENTIAL VIROLOGICAL FAILURE WITH RESISTANCE TUMS CAN BE GIVEN BUT NEED CONSULT WITH ID PHARMACY RE TIMING. I PREFER HER TO AVOID ALL TOGETHER      Medication List    STOP taking these medications   BENGAY EX   cefdinir 300 MG capsule Commonly known as: OMNICEF   dexamethasone 2 MG tablet Commonly known as: DECADRON   ELDERBERRY PO   EQ Nutritional Shake Liqd   feeding supplement (ENSURE ENLIVE) Liqd   fluconazole 200 MG tablet Commonly known as: DIFLUCAN   HYDROcodone-acetaminophen 5-325 MG tablet Commonly known as: NORCO/VICODIN   Juluca 50-25 MG Tabs Generic drug: Dolutegravir-Rilpivirine   levothyroxine 200 MCG tablet Commonly known as: SYNTHROID   lidocaine-prilocaine cream Commonly known as: EMLA   Loratadine 10 MG Caps   methocarbamol 500 MG tablet Commonly known as: ROBAXIN   naproxen sodium 220 MG tablet Commonly known as: ALEVE   nystatin powder Commonly known as: MYCOSTATIN/NYSTOP   olmesartan-hydrochlorothiazide 40-25 MG tablet Commonly known as: BENICAR HCT   One-A-Day Womens 50 Plus Tabs   Prezcobix 800-150 MG tablet Generic drug: darunavir-cobicistat   Selzentry 150 MG tablet Generic drug: maraviroc   SYSTANE BALANCE OP     TAKE these medications   morphine 20 MG/ML concentrated solution Commonly known as: ROXANOL Take 0.5 mLs (10 mg total) by mouth every 2 (two) hours as needed for moderate pain, severe pain, anxiety or shortness of breath.      Allergies  Allergen Reactions  . Lisinopril Anaphylaxis, Swelling and Other (See Comments)    Swelling of tongue and mouth 11/05/16- tolerates Olmesartan  . Pepcid [Famotidine] Other (See Comments)    PPI H2, BLOCKERS LOWER GASTRIC PH WHICH WOULD LEAD TO SUBTHERAPEUTIC RILPIVIRINE LEVELS AND  POTENTIAL VIROLOGICAL FAILURE WITH RESISTANCE  . Prilosec [Omeprazole] Other (See Comments)    PPI H2, BLOCKERS LOWER GASTRIC PH WHICH WOULD LEAD TO SUBTHERAPEUTIC RILPIVIRINE LEVELS AND POTENTIAL VIROLOGICAL FAILURE WITH RESISTANCE  . Tums [Calcium Carbonate Antacid] Other (See Comments)    TUMS ANTACIDS CAN LOWER GASTRIC PH WHICH COULD  LEAD TO SUBTHERAPEUTIC RILPIVIRINE LEVELS AND POTENTIAL VIROLOGICAL FAILURE WITH RESISTANCE TUMS CAN BE GIVEN BUT NEED CONSULT WITH ID PHARMACY RE TIMING. I PREFER HER TO AVOID ALL TOGETHER   Contact information for after-discharge care    Destination    [facility] .   Service: Skilled Nursing Contact information: [place] [phone]               The results of significant diagnostics from this hospitalization (including imaging, microbiology, ancillary and laboratory) are listed below for reference.    Significant Diagnostic Studies: EEG  Result Date: 06/21/2019 Greta Doom, MD     06/21/2019 12:38 PM Patient Name: JEANIA NATER MRN: [id number] EEG Attending: Roland Rack Referring Physician/Provider: Roland Rack Date: 06/21/2019 Duration: 24 minutes Patient history: 64 yo F with multiple intracranial metastasis with encephalopathy. Recent EEG with triphasic waves. Level of alertness: Encephalopathic AEDs during EEG study: Keppra 500mg  BID Technical aspects: This EEG study was done with scalp electrodes positioned according to the 10-20 International system of electrode placement. Electrical activity was acquired at a sampling rate of 500Hz  and reviewed with a high frequency filter of 70Hz  and a low frequency filter of 1Hz . EEG data were recorded continuously and digitally stored. BACKGROUND ACTIVITY:  The background consist predominately of generalized irregular slow activity with some frontocentral predominant beta range  activity.  There are poorly formed sleep  structures at times.  No definite posterior dominant rhythm was seen, however following stimulation there was briefly a posteriorly predominant rhythm of 8 Hz but this was very briefly sustained. EPILEPTIFORM ACTIVITY: Interictal epileptiform activity: None Ictal Activity: None OTHER EVENTS: None SLEEP RECORDINGS: Only awake and drowsy states were recorded. Drowsiness was characterized by attenuation of the posterior background rhythm. ACTIVATION PROCEDURES: Hyperventilation and photic stimulation were not performed. IMPRESSION: This EEG is consistent with a generalized nonspecific cerebral dysfunction.  There has been significant improvement since the previous recording and that no triphasic waves are seen in the study. There was no seizure or evidence of seizure predisposition recorded on this study. Please note that lack of epileptiform activity on EEG does not preclude the possibility of epilepsy.  Roland Rack, MD [facility] [phone] If 7pm- 7am, please page neurology on call as listed in [place].   CT ANGIO CHEST PE W OR WO CONTRAST  Result Date: 06/09/2019 CLINICAL DATA:  Shortness of breath.  Metastatic breast cancer. EXAM: CT ANGIOGRAPHY CHEST WITH CONTRAST TECHNIQUE: Multidetector CT imaging of the chest was performed using the standard protocol during bolus administration of intravenous contrast. Multiplanar CT image reconstructions and MIPs were obtained to evaluate the vascular anatomy. CONTRAST:  55mL OMNIPAQUE IOHEXOL 350 MG/ML SOLN COMPARISON:  PET CT 03/11/2019.  Chest CT 04/22/2018 FINDINGS: Cardiovascular: No filling defects in the pulmonary arteries to suggest pulmonary emboli. Tortuous, ectatic aorta with maximum diameter 3.7 cm. No dissection. Heart is enlarged. Mediastinum/Nodes: Extensive adenopathy in the mediastinum. Surgical clips in the right axilla. This is unchanged since recent PET CT. Lungs/Pleura: Extensive pleural nodularity/disease throughout right  hemithorax, similar to recent PET CT. Right middle lobe mass is unchanged. Central necrotic portion again noted, unchanged. Scattered ground-glass airspace disease noted within both lungs concerning for pneumonia. Upper Abdomen: Gastrohepatic ligament lymph nodes again noted, unchanged. No acute findings. Musculoskeletal: Prior right mastectomy. Right chest wall Port-A-Cath remains in place, unchanged. Sclerotic lesion within the T11 vertebral body is stable. Review of the MIP images confirms the above findings. IMPRESSION: No evidence of pulmonary embolus. Extensive mediastinal adenopathy, right pleural metastatic disease, and right middle lobe mass are all stable since prior PET CT. Worsening ground-glass airspace disease throughout both lungs concerning for pneumonia. Cardiomegaly. Tortuous, ectatic thoracic aorta. Electronically Signed   By: Rolm Baptise M.D.   On: 06/09/2019 17:44   MR BRAIN W WO CONTRAST  Result Date: 06/18/2019 CLINICAL DATA:  Encephalopathy. Metastatic breast cancer. Known brain metastases. EXAM: MRI HEAD WITHOUT AND WITH CONTRAST TECHNIQUE: Multiplanar, multiecho pulse sequences of the brain and surrounding structures were obtained without and with intravenous contrast. CONTRAST:  42mL GADAVIST GADOBUTROL 1 MMOL/ML IV SOLN COMPARISON:  MR head without and with contrast 04/28/2019 FINDINGS: Brain: The right occipital lesion has decreased somewhat. There is mostly peripheral enhancement with a nodular component posteriorly. The lesion now measures 2.4 x 2.6 x 2.1 cm. The lesion previously measured 3.4 x 3.1 x 2.7 cm in the same dimensions. A left occipital lesion previously measured 13 x 11 mm. It now measures 13 x 9 mm. A more lateral lesion in the left occipital lobe has decreased from 9 mm to 6.5 mm. A lesion in the lateral left temporal lobe has decreased from 6.53.5 mm on image 23 of series 10. A medial right temporal lobe lesion on image 25 has decreased from 7.5 mm to 6 mm. A  lesion in the left paramedian superior cerebellar vermis  now measures 4 mm. Previously measured 7 mm. A lesion in the left paramedian pons previously measured 4.5 mm. It is now punctate in size. A lesion in the anterior left frontal lobe white matter on image 36 as decreased from 7.5 mm to 3.5 mm. A punctate lesion in the left corona radiata previously measured 4 mm. Punctate lesion is again noted in the posterior left frontal lobe on image 45. No new enhancing lesions are present. Vasogenic edema is most prominent about the occipital lobe lesions. This has decreased since the prior exam. No acute infarct or hemorrhage is present. Ventricles are of normal size. No significant extraaxial fluid collection is present. Vascular: Flow is present in the major intracranial arteries. Skull and upper cervical spine: Extensive marrow signal changes in the calvarium ir consistent with osseous metastases. Heterogeneous marrow signal is present in the upper cervical spine. Sinuses/Orbits: Minimal fluid is present in the left maxillary sinus. A fluid level is present in the left sphenoid sinus. Left ethmoid air cells are partially opacified. The paranasal sinuses and mastoid air cells are otherwise clear. IMPRESSION: 1. Decrease in size of multiple brain metastases identified on the prior exam. No new lesions are present. 2. Decreased vasogenic edema, still predominantly occipital lobes. 3. Diffuse marrow signal changes in the skull concerning for metastatic disease. 4. Left maxillary and sphenoid sinus disease. Electronically Signed   By: San Morelle M.D.   On: 06/18/2019 18:44   DG CHEST PORT 1 VIEW  Result Date: 06/30/2019 CLINICAL DATA:  Pneumonia.  History of breast cancer. EXAM: PORTABLE CHEST 1 VIEW COMPARISON:  06/24/2019. FINDINGS: PowerPort catheter noted with tip over superior vena cava in stable position. Stable cardiomegaly. Diffuse right lung infiltrate/consolidation again noted. Right pleural effusion  again noted. No interim change. Stable mild left mid lung atelectasis/infiltrate. No pneumothorax. IMPRESSION: 1.  PowerPort catheter in stable position. 2.  Stable cardiomegaly. 3. Diffuse right lung infiltrate/consolidation again and prominent right pleural effusion again noted without interim change. Stable mild left mid lung atelectasis/infiltrate. Electronically Signed   By: Marcello Moores  Register   On: 06/30/2019 08:40   DG CHEST PORT 1 VIEW  Result Date: 06/24/2019 CLINICAL DATA:  History of breast cancer. Shortness of breath. Pneumonia. EXAM: PORTABLE CHEST 1 VIEW COMPARISON:  Single-view of the chest 06/12/2019, 06/18/2019 and 06/19/2019. FINDINGS: Right Port-A-Cath remains in place. Extensive airspace disease in the right lung persist but aeration in the right upper lung zone is mildly improved since the most recent study. Airspace disease in the left lung has markedly improved since the most recent examination. There is some residual atelectasis in the left mid lung zone. Cardiac silhouette is largely obscured. Surgical clips in the right axilla and right IJ approach Port-A-Cath are noted. IMPRESSION: Improved aeration in the left mid lung zone with residual atelectasis noted. Large right pleural effusion and airspace disease persist although aeration in the right upper lung zone appears mildly improved since the most recent study. Electronically Signed   By: Inge Rise M.D.   On: 06/24/2019 13:33   DG CHEST PORT 1 VIEW  Result Date: 06/19/2019 CLINICAL DATA:  Unresponsive and agitated. History metastatic breast carcinoma with known extensive right-sided pleural and parenchymal metastatic disease. EXAM: PORTABLE CHEST 1 VIEW COMPARISON:  06/18/2019 FINDINGS: Stable appearance of Port-A-Cath. Stable volume loss and poor aeration of the right lung secondary to extensive pleural tumor and associated loculated right pleural fluid. Lower volume of the left lung with persistent dense infiltrate in the  left mid  lung and probable component of infiltrate at the left lung base. No pneumothorax. No significant left-sided pleural fluid identified. IMPRESSION: Stable extensive pleural and parenchymal disease in the right chest with associated loculated right pleural fluid. Stable dense infiltrate in the left mid lung and probable component of infiltrate at the left lung base. Electronically Signed   By: Aletta Edouard M.D.   On: 06/19/2019 14:37   DG CHEST PORT 1 VIEW  Result Date: 06/18/2019 CLINICAL DATA:  Hypoxia EXAM: PORTABLE CHEST 1 VIEW COMPARISON:  06/13/2019 FINDINGS: Right Port-A-Cath remains in place, unchanged. Cardiomegaly. Diffuse right lung and patchy left lung airspace disease again noted, not significantly changed. Possible right effusion. No acute bony abnormality. IMPRESSION: No significant change since prior study. Electronically Signed   By: Rolm Baptise M.D.   On: 06/18/2019 09:17   DG CHEST PORT 1 VIEW  Result Date: 06/13/2019 CLINICAL DATA:  Hypoxia. EXAM: PORTABLE CHEST 1 VIEW COMPARISON:  June 12, 2019 FINDINGS: The right Port-A-Cath is stable. No pneumothorax. Infiltrate in the left mid lung is more focal in the interval. Infiltrate in the right lung, particularly in the lower right lung is similar in the interval. The cardiomediastinal silhouette is stable. No other interval changes. Stable cardiomediastinal silhouette. IMPRESSION: 1. Stable infiltrate on the right, particularly in the right base. More focal infiltrate in the left mid lung compared to the previous study. Stable right Port-A-Cath. Electronically Signed   By: Dorise Bullion III M.D   On: 06/13/2019 15:45   DG Chest Port 1 View  Result Date: 06/12/2019 CLINICAL DATA:  Hypoxia. Metastatic breast cancer. EXAM: PORTABLE CHEST 1 VIEW COMPARISON:  CTA chest 06/09/2019. One-view chest x-ray 06/08/2019. FINDINGS: The heart size is normal. The heart is enlarged. Right IJ Port-A-Cath is stable. A right pleural  effusion has slightly increased. Right greater than left airspace opacities have progressed. Surgical clips are noted in the right axilla. IMPRESSION: 1. Progressive right greater than left airspace disease compatible with pneumonia. 2. Slight increase in right pleural effusion. 3. Stable right IJ Port-A-Cath. Electronically Signed   By: San Morelle M.D.   On: 06/12/2019 09:29   DG CHEST PORT 1 VIEW  Result Date: 06/08/2019 CLINICAL DATA:  Hypoxia EXAM: PORTABLE CHEST 1 VIEW COMPARISON:  07/10/2018 FINDINGS: Right Port-A-Cath in place with the tip in the SVC. Airspace disease in the right lower lung concerning for pneumonia. No confluent opacity on the left. Heart is borderline in size. Possible small right effusion. No acute bony abnormality. IMPRESSION: Right lower lung airspace disease concerning for pneumonia. Small right effusion suspected. Electronically Signed   By: Rolm Baptise M.D.   On: 06/08/2019 18:55   EEG adult  Result Date: 06/18/2019 Lora Havens, MD     06/18/2019  8:55 PM Patient Name: JENNET SCROGGIN MRN: [id number] Epilepsy Attending: Lora Havens Referring Physician/Provider: Dr Debbe Odea Date: 06/18/2019 Duration: 24.56 mins Patient history: 64yo F with HIV, brain mets and now with ams. EEG to evaluate for seizure Level of alertness: awake AEDs during EEG study: None Technical aspects: This EEG study was done with scalp electrodes positioned according to the 10-20 International system of electrode placement. Electrical activity was acquired at a sampling rate of 500Hz  and reviewed with a high frequency filter of 70Hz  and a low frequency filter of 1Hz . EEG data were recorded continuously and digitally stored. DESCRIPTION: During awake state, no clear posterior dominant rhythm. EEG also showed continuous generalized  3-5hz  theta-delta slowing. Triphasic waves, generalized, maximal  bifrontal, at 2Hz  were also noted. Hyperventilation and photic stimulation were not  performed. ABNORMALITY - Continuous slow, generalized - Triphasic waves, generalized IMPRESSION: This study is  Suggestive of severe diffuse encephalopathy, non specific to etiology but could be secondary to toxic-metabolic causes, cepefime toxicity. No seizures or definite epileptiform discharges were seen throughout the recording. Lora Havens   ECHOCARDIOGRAM COMPLETE  Result Date: 06/12/2019   ECHOCARDIOGRAM REPORT   Patient Name:   ALAZE GARVERICK Date of Exam: 06/12/2019 Medical Rec #:  [id number]         Height:       67.0 in Accession #:    [id number]        Weight:       198.0 lb Date of Birth:  Dec 16, 1955         BSA:          2.01 m Patient Age:    53 years          BP:           128/76 mmHg Patient Gender: F                 HR:           108 bpm. Exam Location:  Inpatient Procedure: 2D Echo, Color Doppler and Cardiac Doppler Indications:    R06.9 DOE  History:        Patient has no prior history of Echocardiogram examinations.                 Risk Factors:Hypertension and Dyslipidemia.  Sonographer:    Raquel Sarna Senior RDCS Referring Phys: [place]  1. Left ventricular ejection fraction, by visual estimation, is 65 to 70%. The left ventricle has normal function. There is no left ventricular hypertrophy.  2. The left ventricle has no regional wall motion abnormalities.  3. Global right ventricle has normal systolic function.The right ventricular size is mildly enlarged. No increase in right ventricular wall thickness.  4. Left atrial size was normal.  5. Right atrial size was severely dilated.  6. The mitral valve is normal in structure. Mild mitral valve regurgitation. No evidence of mitral stenosis.  7. The tricuspid valve is normal in structure. Tricuspid valve regurgitation is mild.  8. The aortic valve is tricuspid. Aortic valve regurgitation is not visualized. No evidence of aortic valve sclerosis or stenosis.  9. There is severe calcifcation of the aortic valve. 10. The  pulmonic valve was normal in structure. Pulmonic valve regurgitation is trivial. 11. Severely elevated pulmonary artery systolic pressure at [id number]. 12. The inferior vena cava is normal in size with greater than 50% respiratory variability, suggesting right atrial pressure of 3 mmHg. 13. The interatrial septum appears to be lipomatous. FINDINGS  Left Ventricle: Left ventricular ejection fraction, by visual estimation, is 65 to 70%. The left ventricle has normal function. The left ventricle has no regional wall motion abnormalities. There is no left ventricular hypertrophy. Normal left atrial pressure. Right Ventricle: The right ventricular size is mildly enlarged. No increase in right ventricular wall thickness. Global RV systolic function is has normal systolic function. The tricuspid regurgitant velocity is 4.00 m/s, and with an assumed right atrial  pressure of 8 mmHg, the estimated right ventricular systolic pressure is severely elevated at 72.0 mmHg. Left Atrium: Left atrial size was normal in size. Right Atrium: Right atrial size was severely dilated Pericardium: There is no evidence of pericardial effusion. Mitral Valve: The mitral valve is  normal in structure. Mild mitral valve regurgitation. No evidence of mitral valve stenosis by observation. Tricuspid Valve: The tricuspid valve is normal in structure. Tricuspid valve regurgitation is mild. Aortic Valve: The aortic valve is tricuspid. . There is moderate thickening and severe calcifcation of the aortic valve. Aortic valve regurgitation is not visualized. The aortic valve is structurally normal, with no evidence of sclerosis or stenosis. There is moderate thickening of the aortic valve. There is severe calcifcation of the aortic valve. Pulmonic Valve: The pulmonic valve was normal in structure. Pulmonic valve regurgitation is trivial. Pulmonic regurgitation is trivial. Aorta: The aortic root, ascending aorta and aortic arch are all structurally normal, with  no evidence of dilitation or obstruction. Venous: The inferior vena cava is normal in size with greater than 50% respiratory variability, suggesting right atrial pressure of 3 mmHg. IAS/Shunts: Increased thickness of the atrial septum sparing the fossa ovalis consistent with The interatrial septum appears to be lipomatous. No atrial level shunt detected by color flow Doppler. There is no evidence of a patent foramen ovale. No ventricular septal defect is seen or detected. There is no evidence of an atrial septal defect.  LEFT VENTRICLE PLAX 2D LVIDd:         3.75 cm LVIDs:         1.82 cm LV PW:         1.05 cm LV IVS:        0.94 cm LVOT diam:     2.10 cm LV SV:         50 ml LV SV Index:   23.95 LVOT Area:     3.46 cm  RIGHT VENTRICLE             IVC RV Basal diam:  3.23 cm     IVC diam: 1.44 cm RV Mid diam:    4.81 cm RV Length:      6.81 cm RV S prime:     21.10 cm/s TAPSE (M-mode): 1.8 cm LEFT ATRIUM           Index LA diam:      3.10 cm 1.54 cm/m LA Vol (A2C): 34.1 ml 16.94 ml/m LA Vol (A4C): 46.1 ml 22.90 ml/m  AORTIC VALVE LVOT Vmax:   98.80 cm/s LVOT Vmean:  74.000 cm/s LVOT VTI:    0.192 m  AORTA Ao Root diam: 3.10 cm Ao Asc diam:  3.60 cm TRICUSPID VALVE TR Peak grad:   64.0 mmHg TR Vmax:        400.00 cm/s  SHUNTS Systemic VTI:  0.19 m Systemic Diam: 2.10 cm  Fransico Him MD Electronically signed by Fransico Him MD Signature Date/Time: 06/12/2019/3:15:41 PM    Final    VAS [place] LOWER EXTREMITY VENOUS (DVT)  Result Date: 06/09/2019  Lower Venous Study Indications: Swelling.  Risk Factors: Cancer History of metastatic breast cancer. Comparison Study: No prior study on file Performing Technologist: Sharion Dove RVS  Examination Guidelines: A complete evaluation includes B-mode imaging, spectral Doppler, color Doppler, and power Doppler as needed of all accessible portions of each vessel. Bilateral testing is considered an integral part of a complete examination. Limited examinations for reoccurring  indications may be performed as noted.  +---------+---------------+---------+-----------+----------+--------------+ RIGHT    CompressibilityPhasicitySpontaneityPropertiesThrombus Aging +---------+---------------+---------+-----------+----------+--------------+ CFV      Partial        Yes      Yes                  Acute          +---------+---------------+---------+-----------+----------+--------------+  SFJ      Full                                                        +---------+---------------+---------+-----------+----------+--------------+ FV Prox  None                                         Acute          +---------+---------------+---------+-----------+----------+--------------+ FV Mid   None                                         Acute          +---------+---------------+---------+-----------+----------+--------------+ FV DistalNone                                         Acute          +---------+---------------+---------+-----------+----------+--------------+ PFV      Full                                                        +---------+---------------+---------+-----------+----------+--------------+ POP      None           No       No                   Acute          +---------+---------------+---------+-----------+----------+--------------+ PTV      None                                         Acute          +---------+---------------+---------+-----------+----------+--------------+ PERO     None                                         Acute          +---------+---------------+---------+-----------+----------+--------------+ EIV                     Yes      Yes                                 +---------+---------------+---------+-----------+----------+--------------+   +----+---------------+---------+-----------+----------+--------------+ LEFTCompressibilityPhasicitySpontaneityPropertiesThrombus Aging  +----+---------------+---------+-----------+----------+--------------+ CFV Full           Yes      Yes                                 +----+---------------+---------+-----------+----------+--------------+     Summary: Right: Findings consistent with acute deep vein thrombosis involving the right common femoral vein, right femoral  vein, right popliteal vein, right posterior tibial veins, and right peroneal veins. Left: No evidence of common femoral vein obstruction.  *See table(s) above for measurements and observations. Electronically signed by Harold Barban MD on 06/09/2019 at 8:27:06 AM.    Final     Microbiology: Recent Results (from the past 240 hour(s))  SARS CORONAVIRUS 2 (TAT 6-24 HRS) Nasopharyngeal Nasopharyngeal Swab     Status: None   Collection Time: 06/28/19  6:22 PM   Specimen: Nasopharyngeal Swab  Result Value Ref Range Status   SARS Coronavirus 2 NEGATIVE NEGATIVE Final    Comment: (NOTE) SARS-CoV-2 target nucleic acids are NOT DETECTED. The SARS-CoV-2 RNA is generally detectable in upper and lower respiratory specimens during the acute phase of infection. Negative results do not preclude SARS-CoV-2 infection, do not rule out co-infections with other pathogens, and should not be used as the sole basis for treatment or other patient management decisions. Negative results must be combined with clinical observations, patient history, and epidemiological information. The expected result is Negative. Fact Sheet for Patients: SugarRoll.be Fact Sheet for Healthcare Providers: https://www.woods-mathews.com/ This test is not yet approved or cleared by the [place] FDA and  has been authorized for detection and/or diagnosis of SARS-CoV-2 by FDA under an Emergency Use Authorization (EUA). This EUA will remain  in effect (meaning this test can be used) for the duration of the COVID-19 declaration under Section 56 4(b)(1) of the Act,  21 U.S.C. section 360bbb-3(b)(1), unless the authorization is terminated or revoked sooner. Performed at [facility], [place] [address], [place], [place] [address]      Labs: Basic Metabolic Panel: Recent Labs  Lab 07/01/19 0442  NA 150*  K 4.5  CL 105  CO2 36*  GLUCOSE 96  BUN 18  CREATININE 1.35*  CALCIUM 9.1   Liver Function Tests: No results for input(s): AST, ALT, ALKPHOS, BILITOT, PROT, ALBUMIN in the last 168 hours. No results for input(s): LIPASE, AMYLASE in the last 168 hours. No results for input(s): AMMONIA in the last 168 hours. CBC: Recent Labs  Lab 07/01/19 0442  WBC 9.4  NEUTROABS 8.3*  HGB 8.2*  HCT 29.4*  MCV 115.3*  PLT 195   Cardiac Enzymes: No results for input(s): CKTOTAL, CKMB, CKMBINDEX, TROPONINI in the last 168 hours. BNP: BNP (last 3 results) Recent Labs    06/13/19 0432  BNP 171.5*    ProBNP (last 3 results) No results for input(s): PROBNP in the last 8760 hours.  CBG: No results for input(s): GLUCAP in the last 168 hours.     Signed:  Domenic Polite MD.  [facility] 07/07/2019, 10:14 AM

## 2019-07-07 NOTE — Progress Notes (Signed)
Nutrition Brief Note  Chart reviewed. Pt now transitioning to comfort care.  No further nutrition interventions warranted at this time.   Clayton Bibles, MS, RD, LDN Inpatient Clinical Dietitian Pager: [phone] After Hours Pager: [phone]

## 2019-07-07 NOTE — Care Management Important Message (Signed)
Important Message  Patient Details  Name: Kayla Price MRN: [id number] Date of Birth: [birth year]   Medicare Important Message Given:  Yes     Shelda Altes 07/07/2019, 11:23 AM

## 2019-07-07 NOTE — TOC Progression Note (Signed)
Transition of Care [facility]) - Progression Note    Patient Details  Name: SAMAIA IWATA MRN: [id number] Date of Birth: [birth year]  Transition of Care [facility] CM/SW [place], [place] Phone Number: 07/07/2019, 12:51 PM  Clinical Narrative:    CSW spoke with pt's daughter by phone. CSW answered questions on visitation policy for [facility] for pt's daughter.  Audrea Muscat will follow up with pt's daughter for paperwork.  CSW will continue to follow for disposition.   Expected Discharge Plan: Skilled Nursing Facility Barriers to Discharge: (sked last Friday to move to [place] stated anticipate hospital death. Awaiting Dr Hilma Favors to speak with family regarding Residential Facility)  Expected Discharge Plan and Services Expected Discharge Plan: [place] In-house Referral: Clinical Social Work   Post Acute Care Choice: [place] arrangements for the past 2 months: [place] Expected Discharge Date: 07/07/19                                     Social Determinants of Health (SDOH) Interventions    Readmission Risk Interventions Readmission Risk Prevention Plan 06/16/2019  Transportation Screening Complete  Medication Review Press photographer) Complete  PCP or Specialist appointment within 3-5 days of discharge Complete  HRI or Home Care Consult Complete  SW Recovery Care/Counseling Consult Complete  Palliative Care Screening Not [place] Complete  Some recent data might be hidden

## 2019-07-07 NOTE — Progress Notes (Signed)
VAST consulted regarding pt's accessed port and discharge orders. Spoke with pt's nurse who stated patient has discharge orders, but is currently comfort care receiving Dilaudid drip. RN and Charge nurse want to keep patient medicated as long as possible until ride is en route. Advised RN to contact this VAST RN when ride on way and VAST will come de-access port.

## 2019-07-07 NOTE — Progress Notes (Signed)
Pt's daughter Drema Pry aware of pt being discharged and transported. No further questions at this time.

## 2019-07-07 NOTE — Progress Notes (Signed)
Engineer, maintenance [facility]) Hospital Liaison note.   Received request from [place], [place], [place]  for family interest in [facility] with request for transfer  today. Chart reviewed and eligibility confirmed. Spoke with daughter, [name] to confirm interest and explain services. Family agreeable to transfer today. CSW aware.  Registration paper work completed. Dr. Orpah Melter to assume care per family request.   Please fax discharge summary to [phone]. RN please call report to [phone]. Please arrange transport for patient.  Thank you,    Farrel Gordon, RN, [facility]   [place] are on AMION

## 2019-07-10 ENCOUNTER — Other Ambulatory Visit: Payer: POS

## 2019-07-10 ENCOUNTER — Ambulatory Visit: Payer: Medicare HMO | Admitting: Oncology

## 2019-07-10 ENCOUNTER — Ambulatory Visit: Payer: POS

## 2019-07-15 ENCOUNTER — Other Ambulatory Visit: Payer: Self-pay | Admitting: Oncology

## 2019-07-15 NOTE — Telephone Encounter (Signed)
No entry 

## 2019-07-16 ENCOUNTER — Encounter: Payer: Self-pay | Admitting: *Deleted

## 2019-07-27 DEATH — deceased

## 2019-10-19 ENCOUNTER — Other Ambulatory Visit: Payer: Medicare HMO

## 2019-10-20 ENCOUNTER — Encounter: Payer: Medicare HMO | Admitting: Nurse Practitioner

## 2019-10-26 IMAGING — DX DG CHEST 1V PORT
1 series · 1 of 1 positions shown · non-contrast
Comparison: 05/30/2018

CLINICAL DATA: S/p right-sided VATS and talc pleurodesis. Follow-up
pneumothorax.

EXAM:
PORTABLE CHEST 1 VIEW

[chest]
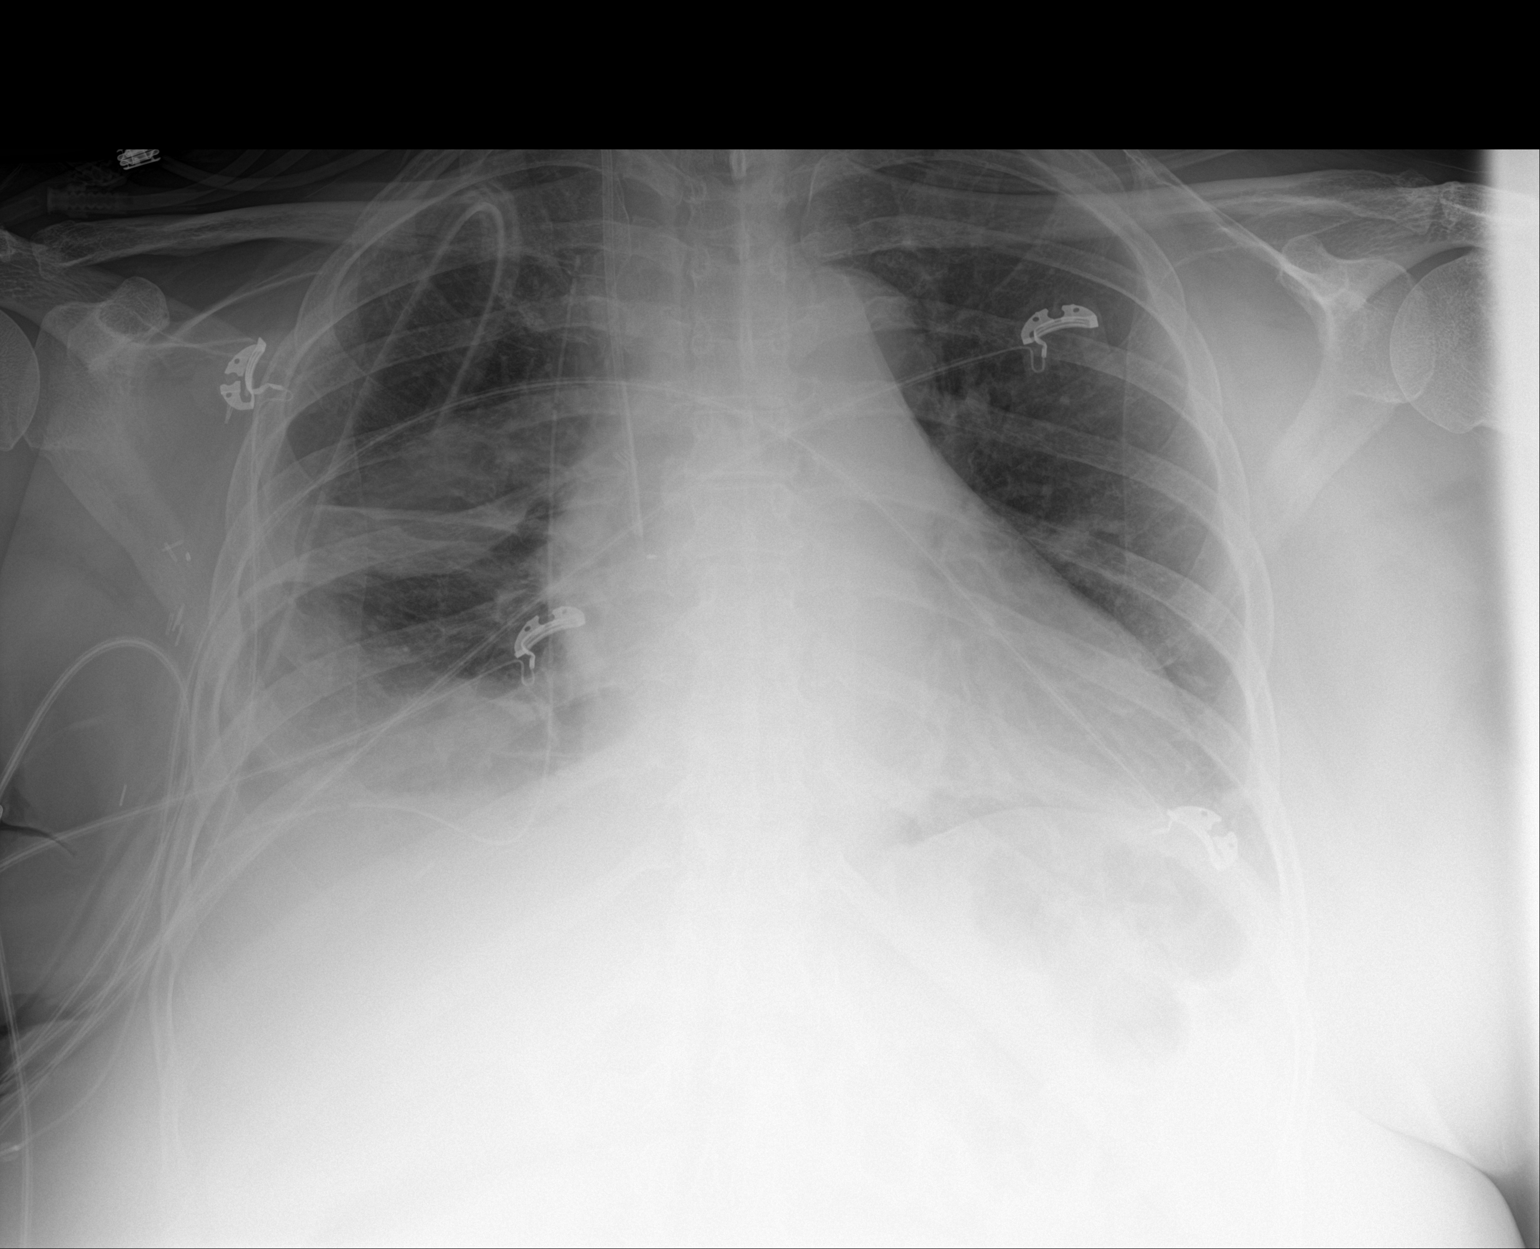

[1 of 1 positions shown; findings below may reference images not displayed]

FINDINGS: Right-sided pleural drain and chest tube remains in place, as well
as right jugular central venous catheter. A tiny residual right
pneumothorax is noted as well as small amount of fluid within the
right pleural space. Atelectasis in right mid and lower lung is
stable. Increased atelectasis or developing infiltrate seen in the
retrocardiac left lower lobe. Heart size remains stable.
IMPRESSION: Small residual right hydropneumothorax.

New atelectasis or infiltrate in the retrocardiac left lower lobe.

## 2019-10-27 IMAGING — DX DG CHEST 1V PORT
1 series · 1 of 1 positions shown · non-contrast
Comparison: Earlier today at 0700 hours.

CLINICAL DATA: Right-sided chest tube removal.

EXAM:
PORTABLE CHEST 1 VIEW

[chest]
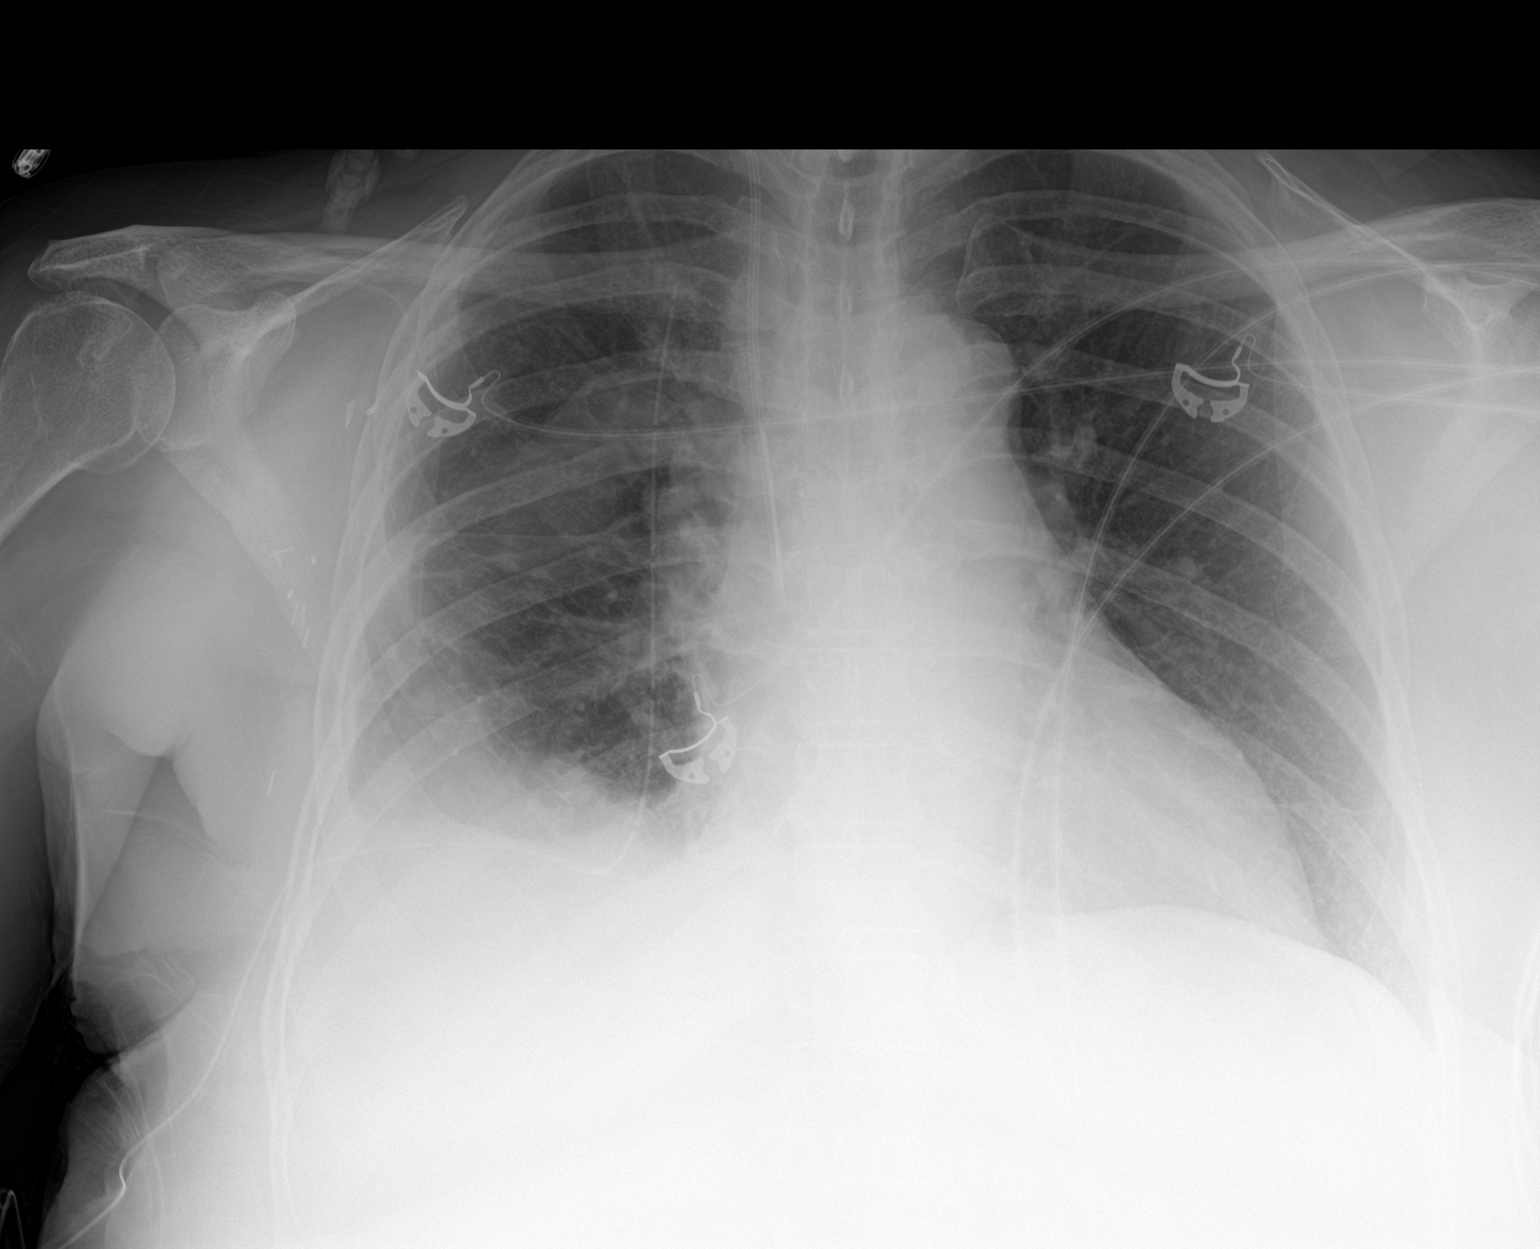

[1 of 1 positions shown; findings below may reference images not displayed]

FINDINGS: 6000. Removal of the more lateral of 2 right-sided chest tubes. The
previously described tiny right superolateral pneumothorax is no
longer identified. Right internal jugular line is unchanged. Normal
heart size. Small volume right-sided pleural fluid and patchy areas
of atelectasis remain. The fluid in the right minor fissure is
significantly improved.
IMPRESSION: Removal of 1 right-sided chest tube, without evidence of residual
pneumothorax.

Decreased right-sided pleural fluid and improved right-sided
atelectasis.

## 2019-11-02 ENCOUNTER — Encounter: Payer: Medicare HMO | Admitting: Infectious Disease

## 2019-12-09 IMAGING — US IR FLUORO GUIDE CV LINE*L*
1 series · 2 of 2 positions shown · non-contrast
Comparison: None.

INDICATION: 62-year-old with metastatic breast cancer. Port-A-Cath needed for
therapy.

EXAM:
FLUOROSCOPIC AND ULTRASOUND GUIDED PLACEMENT OF A SUBCUTANEOUS PORT

[Series 1: ir fluoro guide cv line*left* · 2 of 2 slices shown]
[im 1/2]
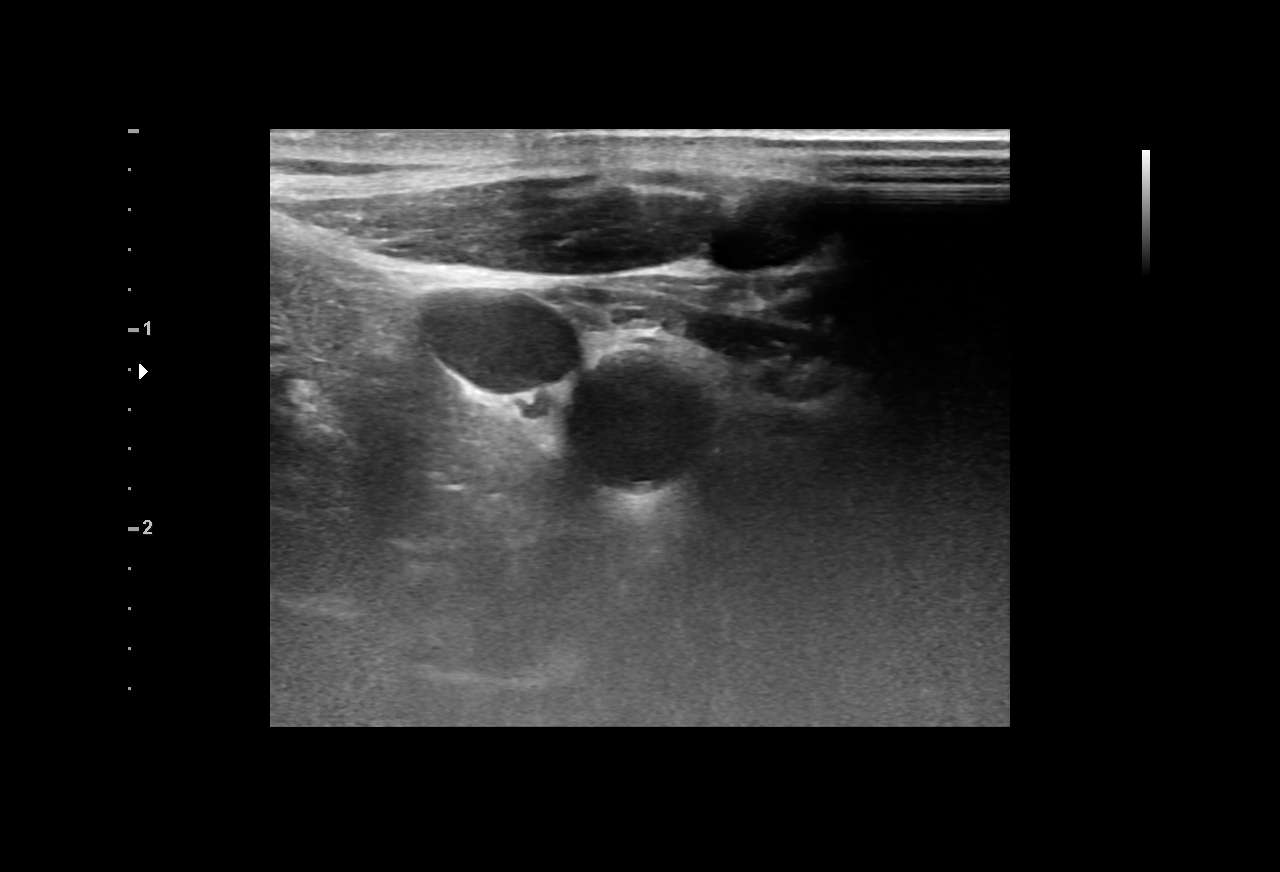
[im 2/2]
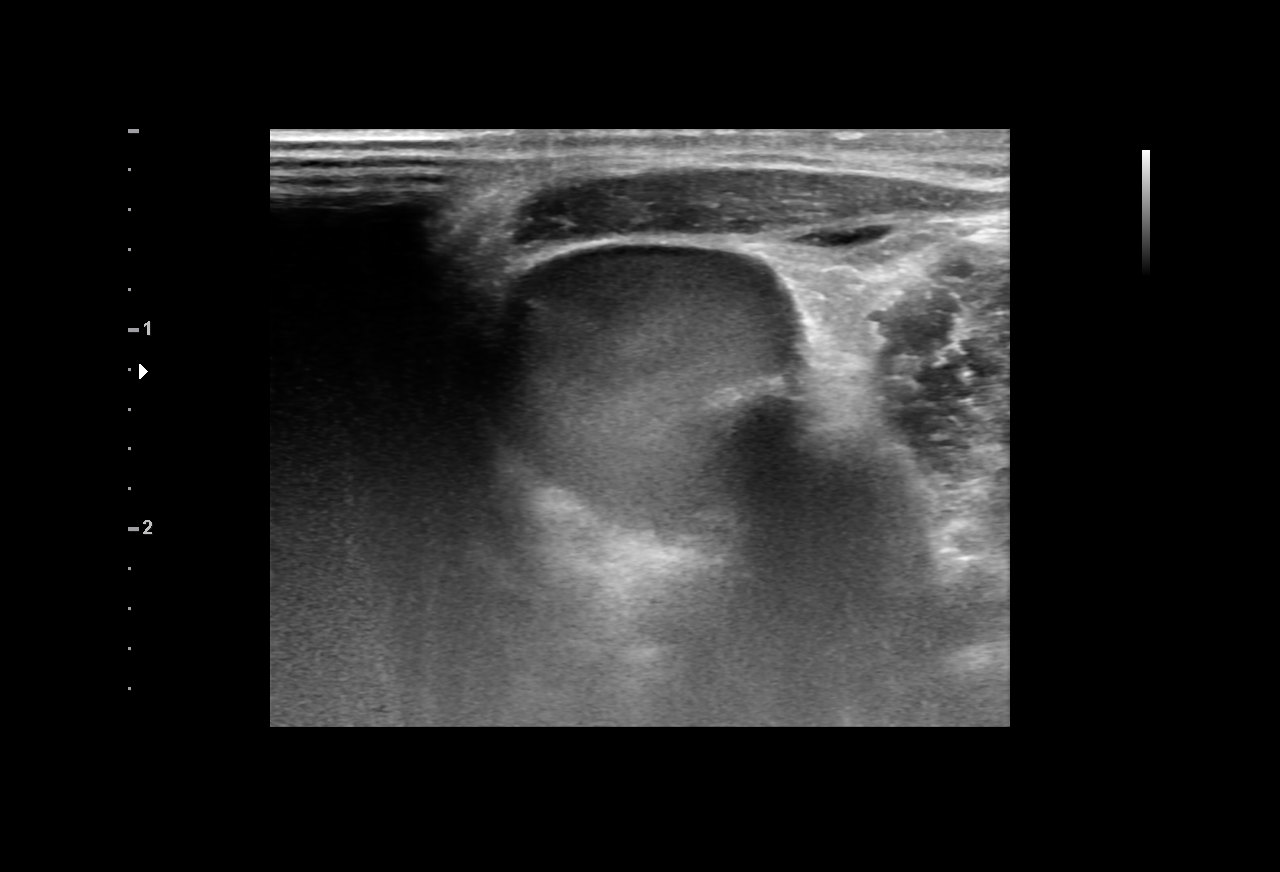

[2 of 2 positions shown; findings below may reference images not displayed]

MEDICATIONS:
Ancef 3 g; The antibiotic was administered within an appropriate
time interval prior to skin puncture.

ANESTHESIA/SEDATION:
Versed 3.0 mg IV; Fentanyl 200 mcg IV;

Moderate Sedation Time:  32 minutes

The patient was continuously monitored during the procedure by the
interventional radiology nurse under my direct supervision.

FLUOROSCOPY TIME:  30 seconds, 6 mGy

COMPLICATIONS:
None immediate.

PROCEDURE:
The procedure, risks, benefits, and alternatives were explained to
the patient. Questions regarding the procedure were encouraged and
answered. The patient understands and consents to the procedure.

Patient was placed supine on the interventional table. Ultrasound
confirmed a patent right internal jugular vein. Ultrasound image was
saved for documentation. The right chest and neck were cleaned with
a skin antiseptic and a sterile drape was placed. Maximal barrier
sterile technique was utilized including caps, mask, sterile gowns,
sterile gloves, sterile drape, hand hygiene and skin antiseptic. The
right neck was anesthetized with 1% lidocaine. Small incision was
made in the right neck with a blade. Micropuncture set was placed in
the right internal jugular vein with ultrasound guidance. The
micropuncture wire was used for measurement purposes. The right
chest was anesthetized with 1% lidocaine with epinephrine. #15 blade
was used to make an incision and a subcutaneous port pocket was
formed. 8 french Power Port was assembled. Subcutaneous tunnel was
formed with a stiff tunneling device. The port catheter was brought
through the subcutaneous tunnel. The port was placed in the
subcutaneous pocket and sutured to the chest wall. The micropuncture
set was exchanged for a peel-away sheath. The catheter was placed
through the peel-away sheath and the tip was positioned at the
superior cavoatrial junction. Catheter placement was confirmed with
fluoroscopy. The port was accessed and flushed with heparinized
saline. The port pocket was closed using two layers of absorbable
sutures and Dermabond. The vein skin site was closed using a single
layer of absorbable suture and Dermabond. Sterile dressings were
applied. Patient tolerated the procedure well without an immediate
complication. Ultrasound and fluoroscopic images were taken and
saved for this procedure.
IMPRESSION: Placement of a subcutaneous CT injectable port device.
